# Patient Record
Sex: Female | Born: 1980 | Race: Black or African American | Hispanic: No | Marital: Single | Smoking: Current every day smoker
Health system: Southern US, Community
[De-identification: ages and names within clinical notes are randomized; demographics above are authoritative.]

## PROBLEM LIST (undated history)

## (undated) DIAGNOSIS — F419 Anxiety disorder, unspecified: Secondary | ICD-10-CM

## (undated) DIAGNOSIS — D649 Anemia, unspecified: Secondary | ICD-10-CM

## (undated) DIAGNOSIS — R7881 Bacteremia: Secondary | ICD-10-CM

## (undated) DIAGNOSIS — R011 Cardiac murmur, unspecified: Secondary | ICD-10-CM

## (undated) DIAGNOSIS — Z8679 Personal history of other diseases of the circulatory system: Secondary | ICD-10-CM

## (undated) DIAGNOSIS — Z9889 Other specified postprocedural states: Secondary | ICD-10-CM

## (undated) DIAGNOSIS — R569 Unspecified convulsions: Secondary | ICD-10-CM

## (undated) DIAGNOSIS — F1910 Other psychoactive substance abuse, uncomplicated: Secondary | ICD-10-CM

## (undated) DIAGNOSIS — N186 End stage renal disease: Secondary | ICD-10-CM

## (undated) DIAGNOSIS — B955 Unspecified streptococcus as the cause of diseases classified elsewhere: Secondary | ICD-10-CM

## (undated) DIAGNOSIS — I5022 Chronic systolic (congestive) heart failure: Secondary | ICD-10-CM

## (undated) DIAGNOSIS — J189 Pneumonia, unspecified organism: Secondary | ICD-10-CM

## (undated) DIAGNOSIS — M1990 Unspecified osteoarthritis, unspecified site: Secondary | ICD-10-CM

## (undated) DIAGNOSIS — R0600 Dyspnea, unspecified: Secondary | ICD-10-CM

## (undated) DIAGNOSIS — M329 Systemic lupus erythematosus, unspecified: Secondary | ICD-10-CM

## (undated) DIAGNOSIS — M3214 Glomerular disease in systemic lupus erythematosus: Secondary | ICD-10-CM

## (undated) DIAGNOSIS — Z8709 Personal history of other diseases of the respiratory system: Secondary | ICD-10-CM

## (undated) HISTORY — PX: AV FISTULA PLACEMENT: SHX1204

## (undated) HISTORY — DX: Chronic systolic (congestive) heart failure: I50.22

---
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
## 2003-09-08 ENCOUNTER — Emergency Department (HOSPITAL_COMMUNITY): Admission: EM | Admit: 2003-09-08 | Discharge: 2003-09-08 | Payer: Self-pay | Admitting: Emergency Medicine

## 2004-08-26 ENCOUNTER — Emergency Department (HOSPITAL_COMMUNITY): Admission: EM | Admit: 2004-08-26 | Discharge: 2004-08-26 | Payer: Self-pay | Admitting: Emergency Medicine

## 2004-12-28 DIAGNOSIS — M3214 Glomerular disease in systemic lupus erythematosus: Secondary | ICD-10-CM

## 2004-12-28 HISTORY — DX: Glomerular disease in systemic lupus erythematosus: M32.14

## 2004-12-29 DIAGNOSIS — I313 Pericardial effusion (noninflammatory): Secondary | ICD-10-CM | POA: Insufficient documentation

## 2004-12-29 DIAGNOSIS — I314 Cardiac tamponade: Secondary | ICD-10-CM | POA: Insufficient documentation

## 2005-02-20 ENCOUNTER — Emergency Department (HOSPITAL_COMMUNITY): Admission: EM | Admit: 2005-02-20 | Discharge: 2005-02-20 | Payer: Self-pay | Admitting: *Deleted

## 2005-12-01 ENCOUNTER — Emergency Department (HOSPITAL_COMMUNITY): Admission: EM | Admit: 2005-12-01 | Discharge: 2005-12-01 | Payer: Self-pay | Admitting: Emergency Medicine

## 2005-12-03 ENCOUNTER — Ambulatory Visit: Payer: Self-pay | Admitting: Cardiovascular Disease

## 2005-12-04 ENCOUNTER — Encounter: Payer: Self-pay | Admitting: Cardiology

## 2005-12-04 ENCOUNTER — Encounter (INDEPENDENT_AMBULATORY_CARE_PROVIDER_SITE_OTHER): Payer: Self-pay | Admitting: Specialist

## 2005-12-04 ENCOUNTER — Inpatient Hospital Stay (HOSPITAL_COMMUNITY): Admission: EM | Admit: 2005-12-04 | Discharge: 2005-12-12 | Payer: Self-pay | Admitting: Emergency Medicine

## 2005-12-04 ENCOUNTER — Ambulatory Visit: Payer: Self-pay | Admitting: Cardiology

## 2005-12-04 ENCOUNTER — Ambulatory Visit: Payer: Self-pay | Admitting: Infectious Diseases

## 2005-12-05 ENCOUNTER — Ambulatory Visit: Payer: Self-pay | Admitting: Internal Medicine

## 2005-12-05 ENCOUNTER — Encounter: Payer: Self-pay | Admitting: Internal Medicine

## 2005-12-07 ENCOUNTER — Ambulatory Visit: Payer: Self-pay | Admitting: Cardiology

## 2005-12-11 ENCOUNTER — Encounter (INDEPENDENT_AMBULATORY_CARE_PROVIDER_SITE_OTHER): Payer: Self-pay | Admitting: *Deleted

## 2006-02-08 ENCOUNTER — Ambulatory Visit: Payer: Self-pay | Admitting: Cardiovascular Disease

## 2006-02-18 ENCOUNTER — Encounter: Payer: Self-pay | Admitting: Cardiology

## 2006-02-18 ENCOUNTER — Ambulatory Visit: Payer: Self-pay

## 2006-03-28 ENCOUNTER — Inpatient Hospital Stay (HOSPITAL_COMMUNITY): Admission: EM | Admit: 2006-03-28 | Discharge: 2006-03-29 | Payer: Self-pay | Admitting: Emergency Medicine

## 2006-03-28 ENCOUNTER — Ambulatory Visit: Payer: Self-pay | Admitting: Internal Medicine

## 2006-03-28 ENCOUNTER — Ambulatory Visit: Payer: Self-pay | Admitting: *Deleted

## 2006-03-29 ENCOUNTER — Encounter: Payer: Self-pay | Admitting: Cardiology

## 2006-08-26 ENCOUNTER — Ambulatory Visit: Payer: Self-pay | Admitting: Cardiovascular Disease

## 2007-01-24 ENCOUNTER — Encounter: Admission: RE | Admit: 2007-01-24 | Discharge: 2007-01-24 | Payer: Self-pay | Admitting: Nephrology

## 2008-09-05 ENCOUNTER — Inpatient Hospital Stay (HOSPITAL_COMMUNITY): Admission: AD | Admit: 2008-09-05 | Discharge: 2008-09-05 | Payer: Self-pay | Admitting: Obstetrics & Gynecology

## 2008-10-04 ENCOUNTER — Inpatient Hospital Stay (HOSPITAL_COMMUNITY): Admission: AD | Admit: 2008-10-04 | Discharge: 2008-10-04 | Payer: Self-pay | Admitting: Obstetrics

## 2008-10-08 ENCOUNTER — Inpatient Hospital Stay (HOSPITAL_COMMUNITY): Admission: AD | Admit: 2008-10-08 | Discharge: 2008-10-11 | Payer: Self-pay | Admitting: Obstetrics

## 2008-10-31 ENCOUNTER — Emergency Department (HOSPITAL_COMMUNITY): Admission: EM | Admit: 2008-10-31 | Discharge: 2008-10-31 | Payer: Self-pay | Admitting: Emergency Medicine

## 2009-06-06 ENCOUNTER — Emergency Department (HOSPITAL_COMMUNITY): Admission: EM | Admit: 2009-06-06 | Discharge: 2009-06-06 | Payer: Self-pay | Admitting: Emergency Medicine

## 2009-07-30 ENCOUNTER — Inpatient Hospital Stay (HOSPITAL_COMMUNITY): Admission: EM | Admit: 2009-07-30 | Discharge: 2009-08-01 | Payer: Self-pay | Admitting: Emergency Medicine

## 2009-07-30 ENCOUNTER — Ambulatory Visit: Payer: Self-pay | Admitting: Vascular Surgery

## 2009-07-30 ENCOUNTER — Encounter (INDEPENDENT_AMBULATORY_CARE_PROVIDER_SITE_OTHER): Payer: Self-pay | Admitting: Emergency Medicine

## 2011-03-09 ENCOUNTER — Emergency Department (HOSPITAL_COMMUNITY): Payer: Self-pay

## 2011-03-09 ENCOUNTER — Emergency Department (HOSPITAL_COMMUNITY)
Admission: EM | Admit: 2011-03-09 | Discharge: 2011-03-10 | Disposition: A | Discharge status: Home / Self Care | Payer: Self-pay | Attending: Emergency Medicine | Admitting: Emergency Medicine

## 2011-03-09 ENCOUNTER — Encounter (HOSPITAL_COMMUNITY): Payer: Self-pay | Admitting: Radiology

## 2011-03-09 DIAGNOSIS — R059 Cough, unspecified: Secondary | ICD-10-CM | POA: Insufficient documentation

## 2011-03-09 DIAGNOSIS — J984 Other disorders of lung: Secondary | ICD-10-CM | POA: Insufficient documentation

## 2011-03-09 DIAGNOSIS — R230 Cyanosis: Secondary | ICD-10-CM | POA: Insufficient documentation

## 2011-03-09 DIAGNOSIS — J189 Pneumonia, unspecified organism: Secondary | ICD-10-CM | POA: Insufficient documentation

## 2011-03-09 DIAGNOSIS — R05 Cough: Secondary | ICD-10-CM | POA: Insufficient documentation

## 2011-03-09 DIAGNOSIS — J40 Bronchitis, not specified as acute or chronic: Secondary | ICD-10-CM | POA: Insufficient documentation

## 2011-03-09 LAB — CBC
HCT: 34.6 % — ABNORMAL LOW (ref 36.0–46.0)
Hemoglobin: 11.3 g/dL — ABNORMAL LOW (ref 12.0–15.0)
MCHC: 32.7 g/dL (ref 30.0–36.0)
MCV: 82 fL (ref 78.0–100.0)
RDW: 13.4 % (ref 11.5–15.5)

## 2011-03-09 LAB — DIFFERENTIAL
Basophils Absolute: 0 10*3/uL (ref 0.0–0.1)
Basophils Relative: 0 % (ref 0–1)
Eosinophils Absolute: 0.2 10*3/uL (ref 0.0–0.7)
Eosinophils Relative: 2 % (ref 0–5)
Lymphocytes Relative: 14 % (ref 12–46)
Lymphs Abs: 1.1 10*3/uL (ref 0.7–4.0)
Monocytes Absolute: 0.7 10*3/uL (ref 0.1–1.0)
Monocytes Relative: 9 % (ref 3–12)
Neutro Abs: 5.9 10*3/uL (ref 1.7–7.7)
Neutrophils Relative %: 75 % (ref 43–77)

## 2011-03-09 LAB — POCT CARDIAC MARKERS
CKMB, poc: <1 ng/mL — ABNORMAL LOW (ref 1.0–8.0)
Myoglobin, poc: 140 ng/mL (ref 12–200)
Troponin i, poc: <0.05 ng/mL (ref 0.00–0.09)

## 2011-03-09 LAB — POCT I-STAT, CHEM 8
BUN: 21 mg/dL (ref 6–23)
Calcium, Ion: 1.1 mmol/L — ABNORMAL LOW (ref 1.12–1.32)
Chloride: 108 mEq/L (ref 96–112)
Creatinine, Ser: 1.6 mg/dL — ABNORMAL HIGH (ref 0.4–1.2)
Glucose, Bld: 87 mg/dL (ref 70–99)
HCT: 37 % (ref 36.0–46.0)
Hemoglobin: 12.6 g/dL (ref 12.0–15.0)
Potassium: 3.7 mEq/L (ref 3.5–5.1)
Sodium: 140 mEq/L (ref 135–145)
TCO2: 24 mmol/L (ref 0–100)

## 2011-03-09 MED ORDER — IOHEXOL 300 MG/ML  SOLN
80.0000 mL | Freq: Once | INTRAMUSCULAR | Status: AC | PRN
  Administered 2011-03-09: 80 mL via INTRAVENOUS

## 2011-04-04 LAB — COMPREHENSIVE METABOLIC PANEL
ALT: <8 U/L (ref 0–35)
AST: 15 U/L (ref 0–37)
Alkaline Phosphatase: 33 U/L — ABNORMAL LOW (ref 39–117)
BUN: 35 mg/dL — ABNORMAL HIGH (ref 6–23)
CO2: 22 mEq/L (ref 19–32)
Calcium: 8.3 mg/dL — ABNORMAL LOW (ref 8.4–10.5)
Creatinine, Ser: 1.63 mg/dL — ABNORMAL HIGH (ref 0.4–1.2)
GFR calc Af Amer: >60 mL/min (ref >60)
GFR calc non Af Amer: 38 mL/min — ABNORMAL LOW (ref >60)
Glucose, Bld: 113 mg/dL — ABNORMAL HIGH (ref 70–99)
Glucose, Bld: 87 mg/dL (ref 70–99)
Potassium: 4.9 mEq/L (ref 3.5–5.1)
Sodium: 134 mEq/L — ABNORMAL LOW (ref 135–145)
Total Protein: 8.1 g/dL (ref 6.0–8.3)

## 2011-04-04 LAB — URINALYSIS, ROUTINE W REFLEX MICROSCOPIC
Glucose, UA: NEGATIVE mg/dL
Leukocytes, UA: NEGATIVE
Protein, ur: >300 mg/dL — AB
Specific Gravity, Urine: 1.024 (ref 1.005–1.030)
Urobilinogen, UA: 0.2 mg/dL (ref 0.0–1.0)

## 2011-04-04 LAB — IRON AND TIBC
Saturation Ratios: 6 % — ABNORMAL LOW (ref 20–55)
TIBC: 169 ug/dL — ABNORMAL LOW (ref 250–470)
UIBC: 159 ug/dL

## 2011-04-04 LAB — CBC
Hemoglobin: 10 g/dL — ABNORMAL LOW (ref 12.0–15.0)
Hemoglobin: 8.8 g/dL — ABNORMAL LOW (ref 12.0–15.0)
MCHC: 33.8 g/dL (ref 30.0–36.0)
MCV: 79.9 fL (ref 78.0–100.0)
RBC: 3.23 MIL/uL — ABNORMAL LOW (ref 3.87–5.11)
RBC: 3.69 MIL/uL — ABNORMAL LOW (ref 3.87–5.11)
RDW: 14.2 % (ref 11.5–15.5)
RDW: 14.3 % (ref 11.5–15.5)

## 2011-04-04 LAB — DIFFERENTIAL
Basophils Absolute: 0 10*3/uL (ref 0.0–0.1)
Basophils Relative: 0 % (ref 0–1)
Eosinophils Relative: 0 % (ref 0–5)
Lymphocytes Relative: 11 % — ABNORMAL LOW (ref 12–46)
Neutro Abs: 5.7 10*3/uL (ref 1.7–7.7)

## 2011-04-04 LAB — RETICULOCYTES
RBC.: 3.32 MIL/uL — ABNORMAL LOW (ref 3.87–5.11)
Retic Ct Pct: 0.7 % (ref 0.4–3.1)

## 2011-04-04 LAB — BASIC METABOLIC PANEL
BUN: 28 mg/dL — ABNORMAL HIGH (ref 6–23)
CO2: 20 mEq/L (ref 19–32)
Calcium: 8.2 mg/dL — ABNORMAL LOW (ref 8.4–10.5)
Creatinine, Ser: 1.1 mg/dL (ref 0.4–1.2)
GFR calc non Af Amer: >60 mL/min (ref >60)
Glucose, Bld: 88 mg/dL (ref 70–99)
Sodium: 134 mEq/L — ABNORMAL LOW (ref 135–145)

## 2011-04-04 LAB — URINE MICROSCOPIC-ADD ON

## 2011-04-04 LAB — SEDIMENTATION RATE: Sed Rate: 92 mm/hr — ABNORMAL HIGH (ref 0–22)

## 2011-04-04 LAB — C-REACTIVE PROTEIN: CRP: 6.1 mg/dL — ABNORMAL HIGH (ref <0.6)

## 2011-04-04 LAB — FERRITIN: Ferritin: 41 ng/mL (ref 10–291)

## 2011-04-06 LAB — URINALYSIS, ROUTINE W REFLEX MICROSCOPIC
Bilirubin Urine: NEGATIVE
Ketones, ur: 15 mg/dL — AB
Nitrite: NEGATIVE
Specific Gravity, Urine: 1.021 (ref 1.005–1.030)
Urobilinogen, UA: 0.2 mg/dL (ref 0.0–1.0)

## 2011-04-06 LAB — URINE MICROSCOPIC-ADD ON

## 2011-04-06 LAB — POCT I-STAT, CHEM 8
Calcium, Ion: 1.1 mmol/L — ABNORMAL LOW (ref 1.12–1.32)
Glucose, Bld: 71 mg/dL (ref 70–99)
HCT: 34 % — ABNORMAL LOW (ref 36.0–46.0)
TCO2: 16 mmol/L (ref 0–100)

## 2011-04-06 LAB — POCT PREGNANCY, URINE: Preg Test, Ur: NEGATIVE

## 2011-05-12 NOTE — H&P (Signed)
[name], [name] NO.:  [id number]   MEDICAL RECORD NO.:  [id number]          PATIENT TYPE:  INP   LOCATION:  5507                         FACILITY:  [facility]   PHYSICIAN:  Orpah Melter, MD       DATE OF BIRTH:  1981/11/05   DATE OF ADMISSION:  07/30/2009  DATE OF DISCHARGE:                              HISTORY & PHYSICAL   PRIMARY CARE PHYSICIAN:  Unassigned.   HISTORY OF PRESENT ILLNESS:  The patient is a 30 year old female with  complaints of stiff from head to toe.  The patient also reports on the  previous evening nausea with one episode of vomiting.  This is now  resolved but her stiffness persists.  The patient says that she had leg  pain, predominantly in her calves, but denies current fevers, chills,  night sweats, chest pain, shortness of breath, PND, or orthopnea.  The  patient has a history of lupus including a chronic lupus nephritis, and  she has been admitted for further evaluation and treatment.   PAST MEDICAL HISTORY:  1. History of pericarditis.  2. Lupus erythematosus.  3. History of lupus nephritis.   SOCIAL HISTORY:  The patient reports smoking 5-6 cigarettes daily.  Also  admits to occasional marijuana, but denies alcohol use.   FAMILY HISTORY:  Not available.   REVIEW OF SYSTEMS:  All other comprehensive review systems are negative.   MEDICATIONS:  The patient is a allergic to TOBRAMYCIN.   CURRENT MEDICATIONS:  None.   PHYSICAL EXAMINATION:  GENERAL:  She is a well-developed, well-nourished  black female currently in no apparent distress.  VITAL SIGNS:  Blood pressure 109/77, heart rate 89, respiratory rate 20,  temperature 98.7.  HEENT:  No jugular venous distention or lymphadenopathy.  Oropharynx is  clear.  Mucous membranes pink and moist.  TMs clear bilaterally.  Pupils  equal, reactive to light and accommodation.  Extraocular muscles are  intact.  CARDIOVASCULAR:  Regular rate and rhythm without murmurs, rubs, or  gallops.  PULMONARY:  Lungs are clear to auscultation bilaterally.  ABDOMEN:  Soft, nontender, nondistended without hepatosplenomegaly.  Bowel sounds are present.  She has no rebound or guarding.  EXTREMITIES:  She has synovitis in her hands bilaterally along with  swelling predominantly in her hands.  She does not have evidence of  synovitis or swelling in her feet, otherwise she does not have clubbing  or cyanosis.  NEURO:  Cranial nerves II through XII are grossly intact.  She has no  focal neurological deficits.   STUDIES:  Bilateral ultrasound showing no evidence of clots.   LABORATORY DATA:  White count 7.0, H&H 10 and 30, MCV 80, platelets 170.  Sodium 135, potassium 4.8, chloride 109, bicarb 22, BUN 35, creatinine  1.63, baseline creatinine 0.51, glucose 87, ESR 92.  UA shows specific  gravity of 1.024 with protein greater than 300.   ASSESSMENT AND PLAN:  1. Lupus flare.  We will treat initially with Solu-Medrol, transition      to prednisone.  We will consult Nephrology if her creatinine does  not decrease back to normal with IV fluids.  2. Acute renal failure on chronic renal insufficiency.  We will start      IV fluids and reassess in a.m.  3. Anemia.  We will check iron studies to assess for potential iron      deficiency, although this is likely secondary to chronic      inflammation.  4. Disposition.  The patient is full code.  H&P was constructed by      reviewing past medical history conferring with emergency medical      room physician, reviewing the emergency medical record.   TIME SPENT:  1 hour.      Orpah Melter, MD  Electronically Signed     Orpah Melter, MD  Electronically Signed    JF/MEDQ  D:  07/30/2009  T:  07/31/2009  Job:  [id number]

## 2011-05-12 NOTE — Discharge Summary (Signed)
[name], [name]                ACCOUNT NO.:  [id number]   MEDICAL RECORD NO.:  [id number]          PATIENT TYPE:  INP   LOCATION:  [id number]                         FACILITY:  [facility]   PHYSICIAN:  Domingo Mend, M.D. DATE OF BIRTH:  [birth year]   DATE OF ADMISSION:  07/30/2009  DATE OF DISCHARGE:  08/01/2009                               DISCHARGE SUMMARY   DISCHARGE DIAGNOSES:  1. Generalized joint pain secondary to lupus flare, improving.  2. Acute renal insufficiency.  3. Systemic lupus erythematosus.  4. History of lupus pericarditis.  5. Protein-calorie malnutrition.   DISCHARGE MEDICATIONS:  1. Multivitamin 1 tablet daily.  2. Prednisone 60 mg today and tomorrow and then decrease by 10 mg      every other day until off.   DISPOSITION AND FOLLOWUP:  [name] will be discharged home today in  stable condition.  She currently does not have a primary care physician.  I have asked Case Management to assist in finding a new one for her.  She also has a rheumatologist, Dr. Charlestine Night that I have instructed to  follow up with him within the next couple of weeks.   CONSULTATION THIS HOSPITALIZATION:  None.   IMAGES AND PROCEDURES:  None.   HISTORY AND PHYSICAL EXAMINATION:  For full details, please refer to the  dictation on July 30, 2009 by Dr. Leeroy Bock, but in brief, [name] is a  very pleasant 30 year old African American young lady with a history of  systemic lupus and has a history of lupus pericarditis who presented  with stiffness from head to toe.  In the emergency department, she was  also found to have acute renal insufficiency with a creatinine of 1.63  and hence she was admitted to our service for further evaluation and  management.   HOSPITAL COURSE:  1. Generalized pain, likely secondary to a lupus flare.  She was      started on high-dose steroids.  Her pain has almost completely      resolved.  I will send her home on a tapering dose of prednisone.      I have  asked her to follow up with Dr. Charlestine Night at her convenience.  2. Acute renal insufficiency, likely secondary to decreased      intravascular volume as it has completely corrected to a normal of      1.10 simply with IV fluids.  3. Protein-calorie malnutrition.  She has been seen by the      nutritionist in the hospital.  She has been started on a      multivitamin as well as Resource.   DISCHARGE VITAL SIGNS:  Blood pressure 145/98, heart rate 79,  respirations 18, and O2 sats 100% room air with a temperature of 97.   DISCHARGE LABORATORY DATA:  Sodium 134, potassium 4.5, chloride 108,  bicarb 20, BUN 28, creatinine 1.10 with a glucose of 88.      Domingo Mend, M.D.  Electronically Signed     EH/MEDQ  D:  08/01/2009  T:  08/02/2009  Job:  [id number]

## 2011-05-15 NOTE — Discharge Summary (Signed)
[name], [name] NO.:  [id number]   MEDICAL RECORD NO.:  [id number]          PATIENT TYPE:  INP   LOCATION:  2025                         FACILITY:  [facility]   PHYSICIAN:  Jenkins Rouge, M.D.     DATE OF BIRTH:  1981-06-12   DATE OF ADMISSION:  03/28/2006  DATE OF DISCHARGE:  03/29/2006                                 DISCHARGE SUMMARY   PHYSICIANS:  Discharging physician: Jenkins Rouge, M.D.  Primary cardiologist: Jenkins Rouge, M.D.  Primary care physician: Dr. Arnoldo Morale, [place].  Rheumatologist: Lindaann Slough, M.D.   DISCHARGE DIAGNOSIS:  Pericarditis.   PAST MEDICAL HISTORY:  1.  Pericarditis with pericardial effusion and tamponade status post      pericardiocentesis in December 2006.  2.  Systemic lupus.  3.  Lupus nephritis/nephrotic-range proteinuria/lupus nephritis.  4.  Anemia.  5.  Polysubstance abuse including tobacco, marijuana, alcohol.  6.  Pruritus with pleural effusion.  7.  Mild cardiomyopathy.  Last echocardiogram February 18, 2006, showed an      ejection fraction of 65% with normal wall motion.  Discharging      echocardiogram pending.   HOSPITAL COURSE:  [name] is a 30 year old African-American female with  history of lupus, previous pericardial effusion with pericardiocentesis,  treated in December 2006. The patient had done well since that  hospitalization until several days before this admission.  The patient  states she had chest discomfort for 3 days associated with nausea, vomiting,  fever, chills, and night sweats.  The patient presented to [facility]  Emergency Room for evaluation.  Seen by Dr. Wilhemina Cash.  Bedside echocardiogram  quick look showed normal LV function without any tamponade.  The patient  was admitted with recurrent pericarditis, started on colchicine.  We  continued her prednisone at a lower dose.  Asked rheumatology to consult.  The patient was seen by rheumatology on March 28, 2006, and ordered lab  work,  results of which are pending including DNA antibody, C3 and C4 levels.   Dr. Johnsie Cancel in to see patient on day of discharge.  The patient afebrile,  improvements in chest discomfort.  Patient being discharged home to follow  up as stated.  1.  Dr. Arnoldo Morale in one week.  Patient agrees to call for appointment.  2.  Dr. Charlestine Night in one week.  Patient agrees to call for appointment.  3.  If patient's chest pain or shortness of breath persists, she is to      return to the emergency room or seek medical assistance.   DIET:  Low-sodium.   ACTIVITY:  Increase activity slowly.   WOUND CARE:  Not applicable.   DISCHARGE MEDICATIONS:  1.  Motrin 800 mg p.o. 3 times a day with food.  2.  Prednisone 10 mg daily.  3.  Colchicine 0.6 mg p.o. twice daily.  4.  CellCept as previously prescribed.  5.  Nexium as previously prescribed.   The patient is advised to refrain from alcohol, tobacco, and marijuana  products.   At time of discharge, cardiac enzymes show troponin 0.01.  Potassium  3.9,  BUN 9, creatinine 0.7.  Hemoglobin 13.1, hematocrit 37.6, white blood cell  count 8.9.   The patient has been given prescriptions for Motrin, prednisone, and  colchicine.  I have also given her 10 tablets of Percocet to have available  as needed with no refills.   Duration of discharge encounter: 30 minutes.      Rosanne Sack, NP    ______________________________  Jenkins Rouge, M.D.    MB/MEDQ  D:  03/29/2006  T:  03/30/2006  Job:  [id number]   cc:   Dr. Arnoldo Morale, [place]   Lindaann Slough, M.D.  [address]  [place]  [place] [address]

## 2011-05-15 NOTE — H&P (Signed)
[name], [name]                ACCOUNT NO.:  [id number]   MEDICAL RECORD NO.:  [id number]          PATIENT TYPE:  INP   LOCATION:  1846                         FACILITY:  [facility]   PHYSICIAN:  Corinna L. Conley Canal, MDDATE OF BIRTH:  May 18, 1981   DATE OF ADMISSION:  12/04/2005  DATE OF DISCHARGE:                                HISTORY & PHYSICAL   CHIEF COMPLAINT:  Shortness of breath and vomiting.   HISTORY OF PRESENT ILLNESS:  [name] is an unassigned 30 year old black  female who presents to the emergency room with shortness of breath,  orthopnea.  She has been vomiting and unable to even keep down liquids for  several days.  She has also been having diarrhea.  She denies fevers,  chills, sick contacts.  She has no previous past medical history.  Patient  has had a cough productive of yellow sputum and pleuritic left-sided chest  pain.   PAST MEDICAL HISTORY:  None except she was recently diagnosed, I believe,  with a urinary tract infection, although her UA in the emergency room showed  no nitrites or leukocyte esterase and Phenergan p.r.n.   She is allergic to [facility].   SOCIAL HISTORY:  She smokes about five cigarettes a day.  She smokes  marijuana occasionally.  She drinks rarely.  She has an 34-year-old child and  does not work.   FAMILY HISTORY:  Her aunt has lupus.   REVIEW OF SYSTEMS:  As above, otherwise negative.   PHYSICAL EXAMINATION:  VITAL SIGNS:  Temperature 100.6 orally, blood  pressure 133/83, pulse 136, respiratory rate 22, oxygen saturation 93% on  room air.  GENERAL:  Patient is a thin black female in no acute distress.  HEENT:  Pupils are equal, round, and reactive to light.  She is wearing a  wig.  Moist mucous membranes.  Oropharynx is without erythema or exudate.  NECK:  Supple.  No lymphadenopathy.  No thyromegaly.  LUNGS:  Diminished at the bases.  CARDIOVASCULAR:  Rapid, regular.  No murmurs, rubs, or gallops.  ABDOMEN:  Soft, nontender,  nondistended.  GENITOURINARY:  Deferred.  RECTAL:  Deferred.  GENITOURINARY:  No clubbing, cyanosis, edema.  SKIN:  No rash.  PSYCHIATRIC:  Calm and cooperative.  Normal affect.  NEUROLOGIC:  Alert and oriented.  Cranial nerves and sensory motor  examination are grossly intact.   LABORATORIES:  White count 17,000 with 65% neutrophils, 11% lymphocytes, 12%  monocytes, 10% eosinophils.  INR is 1.4, PTT 33.  D-dimer 8.51.  Complete  metabolic panel significant for an albumin of 1.9, otherwise unremarkable.  Urine pregnancy on December 5 was negative.  UA on December 5 showed greater  than 80 ketones, specific gravity greater than 1.046, moderate bilirubin,  large blood, greater than 300 protein, negative nitrites, negative leukocyte  esterase, hyalin casts, granular casts, 3-6 white cells, 7-10 red cells, few  bacteria.  Chest x-ray shows development of an enlarged cardiac silhouette  compatible with moderate to large pericardial effusion, new small to large  bilateral pleural effusions, and bilateral lower lung  atelectasis/consolidation.  CT of the chest  shows no pulmonary embolus.  It  shows a large pericardial effusion and small to moderate bilateral pleural  effusions with left lower lobe and right middle lobe atelectasis versus  consolidation, moderately enlarged bilateral axillary pericardial,  mediastinal, and supraclavicular lymph nodes, question inflammatory or  neoplastic process.   ASSESSMENT/PLAN:  1.  Large pericardial effusion with tachycardia, dyspnea.  Patient is will      get a thoracentesis by Dr. Johnsie Cancel this morning.  TSH and a sedimentation      rate have been ordered.  2.  Pneumonia.  She has received a dose of azithromycin and I will also give      Rocephin.  3.  Bilateral pleural effusions.  These may need to be tapped eventually.  4.  Proteinuria with hypoalbuminemia.  Patient denies swelling but I will      get 24-hour urine for protein to rule out nephrotic  syndrome.  5.  Diffuse lymphadenopathy.  6.  Anemia, microcytic.  I will check a ferritin and Hemoccult stools.  7.  Nausea, vomiting, diarrhea.  I will check stool studies, given      Phenergan, intravenous fluids, and Protonix.  8.  Eosinophilia.      Corinna L. Conley Canal, MD  Electronically Signed     CLS/MEDQ  D:  12/04/2005  T:  12/04/2005  Job:  [id number]

## 2011-05-15 NOTE — H&P (Signed)
NAMEDUSTY, LEE NO.:  [id number]   MEDICAL RECORD NO.:  [id number]          PATIENT TYPE:  INP   LOCATION:  2025                         FACILITY:  [facility]   PHYSICIAN:  Scarlett Presto, M.D.   DATE OF BIRTH:  November 09, 1981   DATE OF ADMISSION:  03/28/2006  DATE OF DISCHARGE:                                HISTORY & PHYSICAL   CARDIOLOGIST:  Jenkins Rouge, M.D.   ADMITTING CARDIOLOGIST:  Scarlett Presto, M.D.   PRIMARY CARE PHYSICIAN:  Dr. Arnoldo Morale in [facility].   CHIEF COMPLAINT:  Chest pain.   HISTORY OF PRESENT ILLNESS:  This is a 30 year old African-American female  with a history of lupus, previous pericardial effusion with pericardial  centesis in December of 2006.  The patient states that for the last 3 days  she has had chest pain; however, last night her chest pain worsened, and she  also got some nausea and vomiting associated with this chest pain.  The  patient also states that she has had some fevers, chills and sweats during  the night.  The patient was admitted through the emergency room today.  The  patient states that she had just previously decreased her prednisone to 10  mg p.o. daily approximately 10 days ago, and began to have her symptoms of  shortness of breath about 4 days ago.   PAST MEDICAL HISTORY:  1.  Significant for lupus.  2.  She was admitted on December 04, 2005 with history of pericarditis of      pleura and kidneys.  3.  Pericardial effusion, status post pericardial centesis in December.      Recent echo in February of 2005 showed no residual effusion, essentially      normal limit echo.  4.  Nephritis followed by Donato Heinz, M.D.  Renal function is okay.   ALLERGIES:  TOBREX - she gets some swelling in her face.   MEDICATIONS:  1.  CellCept 250 mg b.i.d.  2.  Darvocet-N 100 t.i.d.  3.  Omeprazole 20 mg daily.  4.  Prednisone 10 mg daily.   SOCIAL HISTORY:  She lives in [place].  She is single.  She lives  with  her boyfriend, and at this time is currently unemployed.  She has 1  daughter, age 20.  She smokes approximately 3 cigarettes a day and has an  occasional alcoholic drink.  She states that she did use some illicit drugs  last year; however, she has not used any in over a year.  She did use some  marijuana last week.  She eats a regular diet and has no exercise program.   FAMILY HISTORY:  Mother is alive at age 40.  Father is alive at age 102.  She  has 2 sisters who are healthy.   REVIEW OF SYSTEMS:  CONSTITUTIONAL:  She has had fevers, chills and sweats.  No weight changes or adenopathy.  HEENT:  She has had some occasional  headaches.  No nasal drainage, no vision changes, no dental problems.  SKIN:  Negative for rashes and lesions.  CARDIOPULMONARY:  She has some chest pain,  shortness of breath, dyspnea on exertion, ankle edema.  Some presyncopal  episodes.  No coughing or wheezing.  No claudication.  GU:  She states that  she has had some dark urine.  No frequency or dysuria.  NEUROPSYCHIATRIC:  Positive for weakness, depression and anxiety.  MUSCULOSKELETAL:  She has  some arthralgia in her left arm.  GI:  She has some positive nausea and  vomiting.  No melena.  No hematemesis.  No abdominal pain or change in bowel  habits.  All other systems negative except as noted.   PHYSICAL EXAMINATION:  VITAL SIGNS:  Temperature of 99.2, pulse 112,  respiratory rate 20, blood pressure 130/88, saturating 100% on room air.  GENERAL:  She is in no acute distress.  HEENT:  Pupils are equal and reactive.  Normocephalic, atraumatic.  Her  dentition is her own.  NECK:  No JVD.  Supple without lymphadenopathy.  CARDIOVASCULAR:  Regular rate and rhythm.  No rub.  LUNGS:  Clear to auscultation bilaterally without wheezes, rales or rhonchi.  SKIN:  She has striae, multiple, on her legs and abdomen and breasts.  ABDOMEN:  Soft, nontender, nondistended.  No pulsus paradoxus.  EXTREMITIES:  There  were +1 pulses.  No clubbing, cyanosis, or edema.  MUSCULOSKELETAL:  No joint deformities or effusions.  No spine or CVA  tenderness.  NEUROLOGIC:  Alert and oriented x3.  Cranial nerves II-XII grossly intact.  Strength 5/5 in all extremities.  __________ normal limit sensation  throughout.   CHEST X-RAY:  Chest x-ray shows mild cardiomegaly, which is stable.   ELECTROCARDIOGRAM:  EKG shows a sinus tachycardia with a rate of 107, axis  within normal limits.  PR is 125.  QRS is 79.  QTC is 423.  She has diffuse  ST changes throughout with PR decreased.   LABORATORY DATA:  White blood cell count of 8.9, hemoglobin of 13.1,  hematocrit of 37.6, platelet count of 216.  Sodium of 138, potassium 3.7,  chloride 105, BUN of 11, creatinine of 0.7, glucose of 77.  CK-MB is less  than 0.1.  Troponin is less than 0.05 x2.  Pregnancy test is negative.   We did a quick look echocardiogram with normal limit left ventricular  ejection fraction, normal limit right ventricular systolic function, no  right ventricular or RA collapse.  Small circumferential effusion.  No  tamponade.   ASSESSMENT:  Recurrent pericarditis with lupus.   PLAN:  1.  Start colchicine.  2.  Continue pregnancy.  3.  2-D echocardiogram in the a.m.  4.  Rheumatology consult in the a.m.     ______________________________  April Humphrey, NP      Scarlett Presto, M.D.  Electronically Signed    AH/MEDQ  D:  03/28/2006  T:  03/29/2006  Job:  [id number]

## 2011-05-15 NOTE — Discharge Summary (Signed)
[name], [name]                ACCOUNT NO.:  [id number]   MEDICAL RECORD NO.:  [id number]          PATIENT TYPE:  INP   LOCATION:  4706                         FACILITY:  [facility]   PHYSICIAN:  Corinna L. Conley Canal, MDDATE OF BIRTH:  11-21-1981   DATE OF ADMISSION:  12/04/2005  DATE OF DISCHARGE:  12/12/2005                                 DISCHARGE SUMMARY   DISCHARGE MEDICATIONS:  1.  Prednisone 40 milligrams a day.  2.  Calcium supplementation at least 1000 milligrams a day.  3.  Fish oil capsules.  4.  Aspirin or Motrin as needed for pain.   DISCHARGE DIAGNOSES:  1.  Pericarditis with pericardial effusion and tamponade.  2.  Status post pericardiocentesis.  3.  Pruritus with pleural effusions.  4.  Newly diagnosed lupus.  5.  Nephrotic range proteinuria/lupus nephritis.  6.  Anemia.  7.  Tobacco abuse, marijuana use, counseled against.  8.  Hypoalbuminemia  9.  Resolved nausea, vomiting, diarrhea.   CONDITION:  Stable.   ACTIVITY:  No heavy lifting for two weeks.   DIET:  Should be low-salt.   FOLLOW UP:  Dr. Marval Regal in a week at which time renal biopsy can be  followed up. Follow up with Dr. Jenkins Rouge in two months to evaluate for  recurrent effusion and/or residual constriction. Follow up with  rheumatologist in two weeks. Follow up with primary care physician in 2-4  weeks.   CONSULTATIONS:  1.  Dr. Jenkins Rouge.  2.  Dr. Marveen Reeks.  3.  Dr. Lita Mains.  4.  Dr. Marval Regal.   PROCEDURES:  1.  Pericardiocentesis on December 04, 2005.  2.  Renal biopsy on December 11, 2005.   PERTINENT LABORATORY DATA:  ABG on unrecorded amount of oxygen shows a pH of  7.88, pCO2 of 54, pO2 of 129. CBC on admission was significant for white  blood cell count of 17,000 with 65% neutrophils, 11% lymphocytes, 12%  monocytes and 10% eosinophils. Her INR on admission was 1.4, PTT was 33, D-  dimer was 8.51. Complete metabolic panel was significant for an albumin of  1.9  otherwise unremarkable. Urine pregnancy negative. UA on December 01, 2005  showed 80 ketones, specific gravity greater than 1.046, moderate bilirubin,  large blood, greater than 300 protein, negative nitrites, negative leukocyte  esterase, hyaline cast granular cast 3 to 6 white cells, 7 to 10 red cells,  few bacteria. At discharge, her white count on prednisone was 14,000 after  having normalized prior to institution of steroids. Hemoglobin was 10.0,  hematocrit 28.2, platelet count 545,000. At discharge, her INR was 1.2, PTT  33. Bleeding time 5 minutes. Lupus anticoagulant was not detected.  Cholesterol was 89, lipase 13, LDH 133, ferritin 215, free T4 1.14. TSH  1.059. C3 complement was normal at 92. C4 was normal at 25 and C50 was 54  which is low. Haptoglobin was 357 which is high. HIV nonreactive.  Pericardial fluid had a glucose of 67, total protein of 5, amylase 22, LDH  392. It was cloudy. White count was 1220 with 96% segmented neutrophils.  A  24-hour urine for protein showed 4114 milligrams of protein. UA on December 10, 2005 showed negative ketones, negative bilirubin, small blood, negative  protein, negative nitrites, negative leukocyte esterase, few hyaline and  granular casts, 0/2 white cells, 3/6 red cells, rheumatoid factor less than  20. ANA positive and homogenous pattern with a titer of greater than 1 to  2560, double-stranded DNA antibodies were 10 which is elevated. A beta 2  glycoprotein IgG was normal at 5, beta 2 glycoprotein IgM was normal at less  than 4, beta 2 glycoprotein IgA was normal at 9, cardiolipin antibody IgG  was normal at 13, cardiolipin antibody IgM normal at 10, cardiolipin  antibody IgA normal at 2, Coxsackie virus was 1 to 1024. AntiSmith  antibodies were positive at greater than 8, anti-RNP antibodies were  positive at greater than 8. Antiviral antibodies were slightly elevated at  1.2 and TLA antibodies were negative at less than 0.2.  Scleroderma  antibodies were normal at 0.4. CD-4 count was 660 with a 45%. Influenza  antibodies IIA and B were negative. Special studies in radiology:  EKG on  admission is not currently on the chart but showed sinus tachycardia with  nonspecific changes. Chest x-ray two views showed significant enlarged  pericardial silhouette compatible with moderate to large pericardial  effusion, new small to moderate bilateral pleural effusion and bilateral  lower lung atelectasis versus consolidation. CT angiogram of the chest  showed a large pericardial effusion, small to moderate bilateral pleural  effusions with left lower lobe and right middle lobe  consolidation/atelectasis, mildly enlarged bilateral axillary, mediastinal  and pericardial lymph nodes. Chest x-ray was done serially and on the 14th  showed slightly enlarged bilateral pleural effusions with worsening  bibasilar atelectasis/consolidation, improvement in cardiac silhouette size.  Renal ultrasound showed enlarged kidneys bilaterally measuring both about  13.8 cm.   HISTORY AND HOSPITAL COURSE:  [name] is a very pleasant 30 year old  unassigned black female who presented with shortness of breath, nausea,  vomiting, cough. Please see other dictations for full details. She also had  pleuritic left chest pain. She had a temperature of 100.6, pulse was 136,  respiratory rate 22, oxygen saturation 93% on room air. She had diminished  breath sounds at the bases. JVD regular but rapid heart rate and otherwise  unremarkable examination.   She was admitted to the hospitalist service and underwent an urgent  pericardiocentesis by Dr. Johnsie Cancel as the echocardiogram showed evidence of  tamponade and the patient was symptomatic. She had a drain left in place for  24 hours and then removed. Initially, due to the purulent pericardial  effusion, infectious disease was consulted. The patient had been started on antibiotics on admission as she  showed evidence of pneumonia on chest x-ray  and CAT scan as well as by symptoms. She had been started on Rocephin and  azithromycin. Vancomycin was started by Dr. Johnnye Sima and azithromycin was  stopped. She was placed in respiratory isolation to rule out AFB. All  cultures are negative to date. She did have laboratories consistent with  lupus as was the history. The antibiotics were stopped. Patient's shortness  of breath, tachycardia, chest pain as well as cough improved. Also her  nausea, vomiting, diarrhea improved. Repeat echocardiogram on December 04, 2005 showed moderate pericardial effusion which showed some degree  proximally 25% of change in the mitral inflow pattern with respiration  suggesting continued hemodynamic significance of the effusion versus  possible pericardial constriction.  Echocardiogram on the 9th showed vigorous  ejection fraction of 65-75% without any wall motion abnormalities and had  not changed significantly from the 8th. Dr. Marveen Reeks was consulted for the  new diagnosis of lupus. He recommended prednisone 60 milligrams a day and a  nephrology consult for the proteinuria. Nephrology had concurred with the  prednisone recommendation and did perform a renal biopsy. The results at  this time are pending. The patient had stable hemoglobin, hematocrit and  symptoms after being on bedrest after the biopsy.   She is instructed to return to the emergency room or call physician  immediately for worsening of her shortness of breath. She was instructed to  avoid cigarette smoking and use contraceptive method other than hormonal  possibly Barrier.   She has no primary care physician but think she may follow up with [facility]. I recommend in addition to follow ups of the biopsy  and her pericardial effusion, following the pleural effusions with a chest x-  ray in a few weeks, sooner if more symptomatic.   The patient will need to be on prednisone 60  milligrams daily for about a  month and the rapidity of taper is and potentially other medications is  based on the results of her renal biopsy. Total time on the day of discharge  is 45 minutes.      Corinna L. Conley Canal, MD  Electronically Signed     CLS/MEDQ  D:  12/12/2005  T:  12/12/2005  Job:  [id number]   cc:   Jenkins Rouge, M.D.  [address]  [address]  [place]  [place] [address]   Anson Oregon, M.D.  Fax: [phone]   Donato Heinz, M.D.  Fax: [phone]   Michel Bickers, M.D.  Fax: [phone]

## 2011-05-15 NOTE — Consult Note (Signed)
NAMESUMAIA, VELADOR NO.:  [id number]   MEDICAL RECORD NO.:  [id number]          PATIENT TYPE:  INP   LOCATION:  4706                         FACILITY:  [facility]   PHYSICIAN:  Anson Oregon, M.D.DATE OF BIRTH:  [birth year]   DATE OF CONSULTATION:  12/08/2005  DATE OF DISCHARGE:                                   CONSULTATION   REFERRING PHYSICIAN:  Dr. Montey Hora L. Conley Canal.   REASON FOR CONSULTATION:  [name] is a 30 year old unemployed African  American female, mother of an 72-year-old child who was admitted to the  hospital on the 8th for treatment pericardial effusion with tamponade.   The patient has had a week's history of pain in the anterior chest with  intermittent nausea and vomiting, shortness of breath and dyspnea on  exertion.  The patient came to the emergency room on the 5th of December and  was told that she might have an upper respiratory infection.  She did return  to the emergency room on the 8th when it became apparent that she had more  difficulty breathing.  It was at that time that her chest x-ray revealed  that she had a large cardiac silhouette and CT scanning did confirm the  presence of a large pericardial effusion, small pleural effusions and mild  degree of adenopathy in the chest.   The patient did undergo pericardiocentesis on the 8th.  She has had marked  improvement of the shortness of breath, although she does have some residual  chest wall discomfort.   The patient has had recurrent pain and swelling of the upper extremity  joints over the last few months.  She has had no significant arthritic  problems prior to that.  She has had a family doctor in the community who  has left.  She has not seen a doctor recently for an evaluation of her joint  discomfort.   The patient has not had a recent history of skin rash, mouth sore, eye  problem, significant abdominal problems, peripheral numbness or evidence of  Raynaud's  phenomenon.   The patient is taking an over-the-counter fertility drug.  She is the mother  of an 65-year-old child, although was seeking to have another child soon.   ALLERGIES:  The patient does have a history of allergy to TOBRAMYCIN EYE  DROPS.  Otherwise, she has no significant allergic history.   SOCIAL HISTORY:  She does smoke approximately 5 cigarettes per day.  She  does not use alcohol.   FAMILY HISTORY:  The patient does have a maternal aunt with lupus.   REVIEW OF SYSTEMS:  The patient's last normal menstrual period was on the  28th of November.   LABORATORY STUDIES:  Laboratory studies during the hospitalization revealed  recently a white cell count of 7400, hemoglobin of 9.9, platelet count of  495,000; BUN 9, creatinine 0.8, SGPT 15, SGOT 13, albumin 1.4; TSH 1.059;  negative pregnancy test; nonreactive HIV study; a negative rheumatoid  factor, positive ANA at 1:2560 with a homogeneous pattern, DNA antibody that  was positive at 10, [place] and  RNP antibodies were positive at greater than  8, slightly positive SSA at 1.2, negative SSB and Scl-70 antibodies.   Analysis of fluid drained from the pericardium revealed LDH of 392 with the  normal being 3 to 23.  So far, cultures have been negative.  Twenty-four-  hour urine collection for protein revealed total protein of 4114 mg per 24  hours.  The urinalysis did reveal red cells from 7-10 with granular casts,  according to the report.   PHYSICAL EXAMINATION:  GENERAL:  On physical exam, the patient appears to be  well-oriented.  EXTREMITIES:  Examination of the extremities revealed good hand grip  bilaterally.  There was no swelling of the MCP or PIP joints.  Wrist, elbow  and shoulder exam appeared normal.  The ankle and foot exam revealed good  motion without swelling.  SKIN:  Exam revealed no rash on the trunk, face or extremities.  NEUROLOGIC:  Muscle-strength testing revealed good strength in the upper and  lower  extremity muscle groups.   ASSESSMENT:  The patient has had a history of pleuropericarditis acutely.  She does give a 28-month history of pain and swelling of the upper extremity  joints.  Serological studies suggest that she does have lupus with strongly  positive ANA and DNA antibodies as well as Smith and RNP antibodies.  Complement studies have not yet returned.  The patient does have a  significant degree of proteinuria with a very low blood albumin level.  Certainly, she has had a nephrotic syndrome.  It is difficult to say how  long she has had this problem.  Because of the significance of the  proteinuria and the evidence of blood in the urine, I would think that a  kidney biopsy would be worthwhile to help in the selection of an appropriate  immunotherapy for the long-term.  At this point I would recommend that she  be placed on high-dose prednisone at 60 mg daily.  The patient did go  through clotting studies including PTT, beta 2 glycoprotein 1 and  anticardiolipin antibodies.  Certainly, this is quite appropriate.  I do  feel that a nephrologist could offer good recommendations concerning the  management of the glomerulonephritis, particularly after the biopsy report  is in.  The patient will require a Family Practice physician to help manage  general medical problems and to offer advice in terms of contraception.  I  did recommend to the patient that she not become pregnant at this time.  Certainly, pregnancy could be quite dangerous to both the patient and the  fetus.  She was made aware of this, particularly since she may be placed on  potent immunotherapies.  I did discuss potential side-effects of medication  with her.           ______________________________  Anson Oregon, M.D.     JJZ/MEDQ  D:  12/09/2005  T:  12/11/2005  Job:  [id number]

## 2011-05-15 NOTE — Consult Note (Signed)
[name], ARKWRIGHT NO.:  [id number]   MEDICAL RECORD NO.:  [id number]          PATIENT TYPE:  INP   LOCATION:  4706                         FACILITY:  [facility]   PHYSICIAN:  Donato Heinz, M.D.DATE OF BIRTH:  July 14, 1981   DATE OF CONSULTATION:  12/09/2005  DATE OF DISCHARGE:                                   CONSULTATION   REFERRING PHYSICIAN:  Dr. Montey Hora L. Conley Canal.   REASON FOR REFERRAL:  Lupus nephritis.   HISTORY OF PRESENT ILLNESS:  Ms. Lasek is a 30 year old African American  female who did not have a significant past medical history other than  eclampsia on a previous pregnancy and presented to [facility] on  December 04, 2005 complaining of increasing shortness of breath.  She was  found to have a large pericardial effusion with tamponade and underwent  pericardiocentesis which revealed a purulent exudative fluid.  Workup ensued  and was significant for a negative AFG, elevated white blood cell count in  her pericardial effusion.  She had a positive double-stranded DNA, a  positive ANA, a positive anti-Smith, positive anti-RNP and a positive SSA.  She also underwent a 24-hour urine collection which was significant for 4 g  of protein per 24 hours and she was given a new diagnosis of systemic lupus  erythematosus that was complicated by pericarditis with effusion as well as  pleuritis and pleural effusion.  We have been asked to evaluate the patient  for possible renal biopsy.  The patient denies any previous knowledge of  lupus, but does have a strong family history of lupus.   ALLERGIES:  Her allergies include TOBRAMYCIN EYE DROPS which cause a rash  and swelling of her face.   PAST MEDICAL HISTORY:  1.  Eclampsia from her first pregnancy in 81.  2.  New diagnosis of lupus.  3.  Nephrotic-range proteinuria, 4 g per 24 hours, as well an active urine      sediment with large blood and greater than 300 protein.   CURRENT  MEDICATIONS:  1.  Protonix 40 mg a day.  2.  Potassium chloride 40 mEq 3 times daily.  3.  Prednisone 60 mg a day.  4.  Tylenol q.4 h. p.r.n.  5.  Phenergan p.r.n.  6.  Imodium p.r.n.  7.  Ambien p.r.n.  8.  Dilaudid p.r.n.   FAMILY HISTORY:  Her mother is alive at age 81 in good health.  Father is  alive at age 99 in good health.  She identical twin sisters, age 48, in good  health.  She does have 2 maternal aunts and 1 maternal uncle with lupus.   SOCIAL HISTORY:  She lives with her significant-other.  She has an 8-year-  old daughter.  She is a Agricultural engineer.  She smokes 3-4 cigarettes a day.  She  admits to smoking marijuana every other day, has rare alcohol, denies IV  drug use.   REVIEW OF SYSTEMS:  GENERAL:  She denies any anorexia or malaise.  OPHTHALMIC:  No blurred vision or photophobia.  CARDIAC:  No chest pain or  palpitations.  PULMONARY:  Resolved shortness of breath.  No hemoptysis or  productive cough.  GI:  She does report diarrhea, but no hematochezia,  melena or bright red blood per rectum.  GU:  No dysuria, pyuria, hematuria,  urgency, frequency or retention.  RHEUMATOLOGIC:  She does have some  bilateral hand swelling and arthralgias.  DERMATOLOGIC:  Denies any rashes,  lumps or bumps.  HEMATOLOGIC:  No abnormal bleeding or bruising.  All other  systems negative.   PHYSICAL EXAMINATION:  GENERAL:  This is a well-developed, well-nourished  female in no apparent distress.  VITAL SIGNS:  Temperature 98.8, pulse 109, blood pressure 116/81,  respiratory rate 24.  HEENT:  Head normocephalic, atraumatic.  Pupils are equal, round and  reactive to light.  Extraocular muscles intact.  No icterus.  Oropharynx was  without lesion.  NECK:  Neck was supple with full range of motion, no lymphadenopathy, no  bruits.  LUNGS:  Clear to auscultation and percussion bilaterally.  No rales.  No  rhonchi.  CARDIAC:  She was tachycardic.  No precordial rub appreciated.  ABDOMEN:   Normoactive bowel sounds.  Soft, nontender and non-distended.  No  guarding or rebound.  No bruits.  EXTREMITIES:  She has 1+ pretibial edema bilaterally.  No embolic changes.  Pedal pulses 1+ bilaterally.  DERMATOLOGIC:  No rashes.   LABORATORY DATA:  On December 01, 2005, her urinalysis had large blood,  greater than 300 protein, 3-6 white cells, 7-10 red blood cells.  Most  recent labs were from December 08, 2005:  Hemoglobin 9.9, white blood cell  count 7.8, platelets 495,000.  Sodium 135, potassium 3.3, chloride 105, CO2  28, BUN 9, creatinine 0.8, glucose 82.  Twenty-four-hour urine protein was  significant for 4 g of protein.  Her total cholesterol was 85.   ASSESSMENT AND PLAN:  1.  Lupus nephritis.  The patient did have an active urine sediment      predating her admission and this is most consistent with lupus nephritis      and not a urinary tract infection.  I discussed the risks and benefits      with renal biopsy and the patient has agreed to proceed with a renal      biopsy.  In the meantime, we will check a bleeding time and will repeat      her urinalysis and try to schedule with the ultrasound department.      However, this should not delay her discharge.  If able to do the biopsy      tomorrow, we will proceed; otherwise, we can reschedule as an outpatient      and we will continue with prednisone for now and await renal biopsy.  2.  Systemic lupus erythematosus.  She is on 60 mg a day.  Would recommend a      Rheumatology consult.  3.  Anemia, likely secondary to #2.  I will continue to follow.  4.  Hypokalemia.  She is on replacement.  We will recheck her potassium in      the morning.  5.  Will continue to follow along.  6.  We will also check a urine beta hCG to rule out pregnancy.   Thanks for this consult.           ______________________________  Donato Heinz, M.D.     JC/MEDQ  D:  12/09/2005  T:  12/11/2005  Job:  [id number]

## 2011-05-15 NOTE — Consult Note (Signed)
[name], [name] NO.:  [id number]   MEDICAL RECORD NO.:  [id number]          PATIENT TYPE:  INP   LOCATION:  1846                         FACILITY:  [facility]   PHYSICIAN:  Jenkins Rouge, M.D.     DATE OF BIRTH:  [birth year]   DATE OF CONSULTATION:  DATE OF DISCHARGE:                                   CONSULTATION   HISTORY:  [name] is a 30 year old patient seen in the minor side of the  emergency room tonight.  She was seen 3 days ago and treated for a UTI.   She had increasing shortness of breath.  Her chest x-ray showed cardiac  enlargement with bilateral pleural effusions.  CT scan showed a large  pericardial effusion with question of supraclavicular and axillary  adenopathy.   In talking to the patient she has been increasingly ill over the last week  or so.  She has had low-grade temperature and some nausea and vomiting.   She has no previous real significant medical history.  She has an 61-year-old  daughter, but outside of this has been fairly healthy.  She has older  sisters who are healthy.  There is no history of premature cancer.  There is  no history of lymphoma.  The patient has no history of previous thyroid  disease or connective tissue disease.  There is no arthritide.  She has not  had any significant rash.  She does smoke marijuana on occasion.   REVIEW OF SYSTEMS:  Remarkable for increasing shortness of breath.  She has  PND and orthopnea.  There has been no significant chest pain.   MEDICATIONS:  The only medicines that she has been taking recently have been  Bactrim for her UTI.   LAB WORK:  Pending. I asked the emergency room to add a TSH, T4,  sedimentation rate, ANA to her lab work in regards to a diagnosis for her  pleural pericarditis.   PHYSICAL EXAMINATION:  GENERAL:  On exam she is more comfortable sitting  upright.  NECK:  Her JVP is elevated.  There are positive pulses.  VITAL SIGNS:  Her blood pressure is about [id number]  systolic.  She is tachycardiac  in sinus rhythm at about 115 to 120.  LUNGS:  She has decreased bilateral breath sounds left greater than right.  HEART:  There is an S1-S2 with distant heart sounds.  ABDOMEN:  Benign.  EXTREMITIES:  Lower extremities have intact pulses and no edema.   EKG:  Shows sinus tachycardia with nonspecific ST-T wave changes.   CHEST X-RAY:  Shows marked cardiomegaly, particularly compared to a chest x-  ray done earlier this year, I believe, in February.   CT SCAN:  Shows a large pericardial effusion with axillary and  supraclavicular adenopathy.  She has bilateral pleural effusions with  compressive atelectasis, left greater than right.   I did a quick bedside echo on the patient and this confirms a large  pericardial effusion with RV compression.  The effusion would appear to be  accessible for percutaneous drainage either apically or subcostally.  IMPRESSION:  The patient will be admitted by Dr. Conley Canal in regards to  followup therapy.  I discussed the risks of pericardial centesis with the  patient including bleeding, puncture of the heart, need for further  emergency open procedure.   She understands the need to proceed with this for both diagnostic and  therapeutic reasons. We will make arrangements for the patient to be up in  the catheterization no later than 1-1/2 hours.  I would like to wait to make  sure that her labs are back and her coags are in order.  She would like to  call her boyfriend and her mother as well.  The mother is watching the  patient's 78-year-old child.  She is 36 years old and a consenting adult and  she has signed consent for pericardial centesis.           ______________________________  Jenkins Rouge, M.D.     PN/MEDQ  D:  12/04/2005  T:  12/04/2005  Job:  [id number]

## 2011-05-15 NOTE — Cardiovascular Report (Signed)
[name], MEHRHOFF NO.:  [id number]   MEDICAL RECORD NO.:  [id number]          PATIENT TYPE:  INP   LOCATION:  1846                         FACILITY:  [facility]   PHYSICIAN:  Jenkins Rouge, M.D.     DATE OF BIRTH:  Oct 09, 1981   DATE OF PROCEDURE:  12/04/2005  DATE OF DISCHARGE:                              CARDIAC CATHETERIZATION   PROCEDURE:  Pericardiocentesis.   HISTORY:  Ms. Clyburn is a 30 year old patient who came to the emergency room  with increasing shortness of breath. She was here  3 days ago and treated  for UTI.  A chest x-ray showed severe cardiomegaly.  The CAT scan showed  large pericardial effusion.   HOSPITAL COURSE:  I did a bedside echocardiogram on the patient, which  showed large pericardial effusion with tamponade.  Clinically she was also  in tamponade with __________ small sign, pulsus paradoxus and elevated JVP.  She could not lay flat. She was brought then emergently to the cardiac  catheterization lab. With echocardiographic guidance, a 5.25 inch  14-gauge  angiocatheter was advanced from the subxiphoid position into the pericardial  space.  Pericardial space location was documented both by free wire and  agitated saline injection.   The 5.25 inch  angiocatheter was then exchanged out over wire for a 6-French  sheath.  A 5-French long pigtail was then advanced and the sheath removed.  Approximately 600 cc of yellow serosanguineous fluid was removed.  The  patient's blood pressure increased from [id number] systolic.   Heart rate remained elevated at 117.   The patient had 5 mg of morphine and 5 mg of Versed during the procedure to  make her comfortable.   DISPOSITION:  The pigtail catheter will be left in place for 24 hours.   Samples were sent for anaerobic and aerobic cultures, AFB, CBC protein, cell  count and all other appropriate labs.   The patient tolerated the procedure well.   CONCLUSION:  Successful echocardiographic-guided  pericardiocentesis.           ______________________________  Jenkins Rouge, M.D.     PN/MEDQ  D:  12/04/2005  T:  12/04/2005  Job:  [id number]

## 2011-05-15 NOTE — Assessment & Plan Note (Signed)
[facility]                              CARDIOLOGY OFFICE NOTE   NAME:[name], [name] SPEAKER                       MRN:          [id number]  DATE:08/26/2006                            DOB:          Jun 14, 1981    [name] returns today for followup.  She has got lupus with a pericardial  effusion that needed drainage.  She has had an echo in February and April  with no recurrence.  She is doing fairly well.  She continues to run  somewhat tachycardic.  I suspect this is from her anemia.  She is on  prednisone and Aricept now.  I believe the Aricept is more to preserve renal  function.  She is not having any significant chest pain, PND or orthopnea.  She continues to smoke and drink occasionally, but denies other substance  abuse.   PHYSICAL EXAMINATION:  She looks well.  The pulse is 95 and regular.  LUNGS:  Clear.  CAROTIDS:  Normal.  S1 and S2, normal heart sounds, there is no rub.  ABDOMEN:  Benign.  EXTREMITIES:  Intact pulses, no edema.   EKG is essentially normal with no evidence of pericardial inflammation.   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  Continue to follow up with Dr. Charlestine Night in terms of aggressive therapy for  this, and she is to have blood work at [facility] today to recheck her kidney  function.  I will see her back in a year.                               Wallis Bamberg. Johnsie Cancel, MD, [facility]    PCN/MedQ  DD:  08/26/2006  DT:  08/27/2006  Job #:  [id number]

## 2011-07-22 ENCOUNTER — Emergency Department (HOSPITAL_COMMUNITY)
Admission: EM | Admit: 2011-07-22 | Discharge: 2011-07-22 | Disposition: A | Discharge status: Home / Self Care | Payer: Self-pay | Attending: Emergency Medicine | Admitting: Emergency Medicine

## 2011-07-22 DIAGNOSIS — R221 Localized swelling, mass and lump, neck: Secondary | ICD-10-CM | POA: Insufficient documentation

## 2011-07-22 DIAGNOSIS — R220 Localized swelling, mass and lump, head: Secondary | ICD-10-CM | POA: Insufficient documentation

## 2011-07-22 DIAGNOSIS — K089 Disorder of teeth and supporting structures, unspecified: Secondary | ICD-10-CM | POA: Insufficient documentation

## 2011-07-22 DIAGNOSIS — M329 Systemic lupus erythematosus, unspecified: Secondary | ICD-10-CM | POA: Insufficient documentation

## 2011-09-28 LAB — COMPREHENSIVE METABOLIC PANEL
BUN: 10
CO2: 22
Calcium: 7.7 — ABNORMAL LOW
Chloride: 106
Creatinine, Ser: 0.51
GFR calc non Af Amer: >60
Glucose, Bld: 67 — ABNORMAL LOW
Total Bilirubin: 0.7

## 2011-09-28 LAB — URINALYSIS, ROUTINE W REFLEX MICROSCOPIC
Bilirubin Urine: NEGATIVE
Ketones, ur: NEGATIVE
Leukocytes, UA: NEGATIVE
Nitrite: NEGATIVE
Specific Gravity, Urine: >1.03 — ABNORMAL HIGH
Urobilinogen, UA: 0.2
pH: 7

## 2011-09-28 LAB — CBC
HCT: 24.3 — ABNORMAL LOW
HCT: 31 — ABNORMAL LOW
MCHC: 32.2
MCV: 84.7
Platelets: 174
RBC: 3.66 — ABNORMAL LOW
RDW: 13
WBC: 6.2

## 2011-09-28 LAB — LACTATE DEHYDROGENASE: LDH: 276 — ABNORMAL HIGH

## 2011-09-28 LAB — URIC ACID: Uric Acid, Serum: 4.9

## 2011-09-28 LAB — RPR: RPR Ser Ql: NONREACTIVE

## 2011-09-29 LAB — URINE MICROSCOPIC-ADD ON

## 2011-09-29 LAB — URINALYSIS, ROUTINE W REFLEX MICROSCOPIC
Bilirubin Urine: NEGATIVE
Glucose, UA: NEGATIVE
Ketones, ur: NEGATIVE
Leukocytes, UA: NEGATIVE
Nitrite: NEGATIVE
Protein, ur: >300 — AB
Specific Gravity, Urine: 1.027
Urobilinogen, UA: 1
pH: 6.5

## 2011-09-29 LAB — COMPREHENSIVE METABOLIC PANEL WITH GFR
ALT: 12
AST: 20
Alkaline Phosphatase: 52
CO2: 24
Chloride: 104
GFR calc Af Amer: >60
GFR calc non Af Amer: 57 — ABNORMAL LOW
Sodium: 134 — ABNORMAL LOW
Total Bilirubin: 0.5

## 2011-09-29 LAB — CULTURE, BLOOD (ROUTINE X 2)
Culture: NO GROWTH
Culture: NO GROWTH

## 2011-09-29 LAB — COMPREHENSIVE METABOLIC PANEL
Albumin: 1.1 — ABNORMAL LOW
BUN: 9
Calcium: 7.3 — ABNORMAL LOW
Creatinine, Ser: 1.16
Glucose, Bld: 97
Potassium: 2.6 — CL
Total Protein: 6.6

## 2011-09-29 LAB — CBC
HCT: 31.3 — ABNORMAL LOW
Hemoglobin: 10.2 — ABNORMAL LOW
MCHC: 32.6
MCV: 82.1
Platelets: 330
RBC: 3.81 — ABNORMAL LOW
RDW: 12.4
WBC: 10.3

## 2011-09-29 LAB — URINE CULTURE: Colony Count: 25000

## 2011-09-29 LAB — DIFFERENTIAL
Basophils Absolute: 0
Basophils Relative: 0
Eosinophils Absolute: 0
Eosinophils Relative: 0
Lymphocytes Relative: 18
Lymphs Abs: 1.8
Monocytes Absolute: 1.1 — ABNORMAL HIGH
Monocytes Relative: 11
Neutro Abs: 7.3
Neutrophils Relative %: 72

## 2011-09-29 LAB — RAPID STREP SCREEN (MED CTR MEBANE ONLY): Streptococcus, Group A Screen (Direct): NEGATIVE

## 2012-07-05 ENCOUNTER — Encounter (HOSPITAL_COMMUNITY): Payer: Self-pay | Admitting: Emergency Medicine

## 2012-07-05 ENCOUNTER — Emergency Department (HOSPITAL_COMMUNITY)
Admission: EM | Admit: 2012-07-05 | Discharge: 2012-07-05 | Disposition: A | Discharge status: Home / Self Care | Payer: Self-pay | Attending: Emergency Medicine | Admitting: Emergency Medicine

## 2012-07-05 DIAGNOSIS — M329 Systemic lupus erythematosus, unspecified: Secondary | ICD-10-CM | POA: Insufficient documentation

## 2012-07-05 DIAGNOSIS — N390 Urinary tract infection, site not specified: Secondary | ICD-10-CM | POA: Insufficient documentation

## 2012-07-05 DIAGNOSIS — M549 Dorsalgia, unspecified: Secondary | ICD-10-CM | POA: Insufficient documentation

## 2012-07-05 DIAGNOSIS — R109 Unspecified abdominal pain: Secondary | ICD-10-CM | POA: Insufficient documentation

## 2012-07-05 LAB — POCT I-STAT, CHEM 8
HCT: 31 % — ABNORMAL LOW (ref 36.0–46.0)
Hemoglobin: 10.5 g/dL — ABNORMAL LOW (ref 12.0–15.0)
Potassium: 4.4 mEq/L (ref 3.5–5.1)
Sodium: 140 mEq/L (ref 135–145)
TCO2: 20 mmol/L (ref 0–100)

## 2012-07-05 LAB — URINE MICROSCOPIC-ADD ON

## 2012-07-05 LAB — URINALYSIS, ROUTINE W REFLEX MICROSCOPIC
Bilirubin Urine: NEGATIVE
Glucose, UA: NEGATIVE mg/dL
Ketones, ur: NEGATIVE mg/dL
Protein, ur: >300 mg/dL — AB
pH: 6 (ref 5.0–8.0)

## 2012-07-05 MED ORDER — NITROFURANTOIN MONOHYD MACRO 100 MG PO CAPS: 100.0000 mg | ORAL_CAPSULE | Freq: Two times a day (BID) | ORAL | Status: AC

## 2012-07-05 NOTE — ED Notes (Signed)
Pt c/o left flank pain x weeks that is worse today; pt sts hx of lupus and feels like lupus pains

## 2012-07-05 NOTE — ED Provider Notes (Signed)
History     CSN: [id number]  Arrival date & time 07/05/12  1620   First MD Initiated Contact with Patient 07/05/12 2005      Chief Complaint  Patient presents with  . Flank Pain    HPI  History provided by the patient. Patient is a 31 year old female with history of lupus who presents with complaints of lower back and left flank pains for the past week. Patient reports having history of the same pains related to lupus. Patient states pain feels slightly different and increased. She denies having any prior history of kidney stones and she denies any dysuria or hematuria reports some increased urinary frequency especially at night. Symptoms are not associated with any fever, chills, sweats, nausea or vomiting. She denies any aggravating or alleviating factors. Pain does not radiate.    Past Medical History  Diagnosis Date  . Lupus     History reviewed. No pertinent past surgical history.  History reviewed. No pertinent family history.  History  Substance Use Topics  . Smoking status: Current Everyday Smoker  . Smokeless tobacco: Not on file  . Alcohol Use: Yes    OB History    Grav Para Term Preterm Abortions TAB SAB Ect Mult Living                  Review of Systems  Constitutional: Negative for fever and chills.  Gastrointestinal: Negative for nausea, vomiting, abdominal pain and constipation.  Genitourinary: Positive for frequency and flank pain. Negative for dysuria, hematuria, vaginal bleeding and vaginal discharge.  Musculoskeletal: Positive for back pain.    Allergies  Tobramycin sulfate  Home Medications   Current Outpatient Rx  Name Route Sig Dispense Refill  . IBUPROFEN 200 MG PO TABS Oral Take 400 mg by mouth every 6 (six) hours as needed. For pain    . MELATONIN PO Oral Take 1 tablet by mouth at bedtime as needed. For sleep      BP 133/80  Pulse 61  Temp 98.2 F (36.8 C) (Oral)  Resp 19  SpO2 100%  LMP 06/11/2012  Physical Exam  Nursing  note and vitals reviewed. Constitutional: She is oriented to person, place, and time. She appears well-developed and well-nourished. No distress.  HENT:  Head: Normocephalic.  Cardiovascular: Normal rate and regular rhythm.   Pulmonary/Chest: Effort normal and breath sounds normal.  Abdominal: Soft. There is no tenderness. There is no rebound, no guarding and no CVA tenderness.  Neurological: She is alert and oriented to person, place, and time.  Skin: Skin is warm and dry. No rash noted.  Psychiatric: She has a normal mood and affect. Her behavior is normal.    ED Course  Procedures   Results for orders placed during the hospital encounter of 07/05/12  URINALYSIS, ROUTINE W REFLEX MICROSCOPIC      Component Value Range   Color, Urine YELLOW  YELLOW   APPearance CLOUDY (*) CLEAR   Specific Gravity, Urine 1.018  1.005 - 1.030   pH 6.0  5.0 - 8.0   Glucose, UA NEGATIVE  NEGATIVE mg/dL   Hgb urine dipstick MODERATE (*) NEGATIVE   Bilirubin Urine NEGATIVE  NEGATIVE   Ketones, ur NEGATIVE  NEGATIVE mg/dL   Protein, ur >300 (*) NEGATIVE mg/dL   Urobilinogen, UA 0.2  0.0 - 1.0 mg/dL   Nitrite POSITIVE (*) NEGATIVE   Leukocytes, UA SMALL (*) NEGATIVE  POCT PREGNANCY, URINE      Component Value Range   Preg Test,  Ur NEGATIVE  NEGATIVE  URINE MICROSCOPIC-ADD ON      Component Value Range   Squamous Epithelial / LPF FEW (*) RARE   WBC, UA 7-10  <3 WBC/hpf   RBC / HPF 3-6  <3 RBC/hpf   Bacteria, UA MANY (*) RARE   Casts HYALINE CASTS (*) NEGATIVE  POCT I-STAT, CHEM 8      Component Value Range   Sodium 140  135 - 145 mEq/L   Potassium 4.4  3.5 - 5.1 mEq/L   Chloride 111  96 - 112 mEq/L   BUN 29 (*) 6 - 23 mg/dL   Creatinine, Ser 1.20 (*) 0.50 - 1.10 mg/dL   Glucose, Bld 77  70 - 99 mg/dL   Calcium, Ion 1.23  1.12 - 1.23 mmol/L   TCO2 20  0 - 100 mmol/L   Hemoglobin 10.5 (*) 12.0 - 15.0 g/dL   HCT 31.0 (*) 36.0 - 46.0 %        1. UTI (lower urinary tract infection)        MDM  8:05PM patient seen and evaluated. Patient in no acute distress. No significant CVA tenderness. Urine with some signs concerning for UTI. Labs unremarkable and at baseline with baseline creatinine.  At this time we'll plan to treat for possible UTI with antibiotics. Patient instructed to followup with primary care provider and rheumatology specialist.       Martie Lee, [place] 07/06/12 [phone]

## 2012-07-06 NOTE — ED Provider Notes (Signed)
Medical screening examination/treatment/procedure(s) were performed by non-physician practitioner and as supervising physician I was immediately available for consultation/collaboration.    Kathalene Frames, MD 07/06/12 [phone]

## 2013-01-17 ENCOUNTER — Inpatient Hospital Stay (HOSPITAL_COMMUNITY): Payer: Medicaid Other

## 2013-01-17 ENCOUNTER — Emergency Department (HOSPITAL_COMMUNITY): Payer: Medicaid Other

## 2013-01-17 ENCOUNTER — Encounter (HOSPITAL_COMMUNITY): Payer: Self-pay | Admitting: Internal Medicine

## 2013-01-17 ENCOUNTER — Inpatient Hospital Stay (HOSPITAL_COMMUNITY)
Admission: EM | Admit: 2013-01-17 | Discharge: 2013-01-21 | DRG: 603 | Disposition: A | Discharge status: Home / Self Care | Payer: Medicaid Other | Attending: Internal Medicine | Admitting: Internal Medicine

## 2013-01-17 DIAGNOSIS — Z9889 Other specified postprocedural states: Secondary | ICD-10-CM

## 2013-01-17 DIAGNOSIS — N19 Unspecified kidney failure: Secondary | ICD-10-CM

## 2013-01-17 DIAGNOSIS — L0201 Cutaneous abscess of face: Principal | ICD-10-CM | POA: Diagnosis present

## 2013-01-17 DIAGNOSIS — A599 Trichomoniasis, unspecified: Secondary | ICD-10-CM | POA: Diagnosis present

## 2013-01-17 DIAGNOSIS — N058 Unspecified nephritic syndrome with other morphologic changes: Secondary | ICD-10-CM | POA: Diagnosis present

## 2013-01-17 DIAGNOSIS — N049 Nephrotic syndrome with unspecified morphologic changes: Secondary | ICD-10-CM | POA: Diagnosis present

## 2013-01-17 DIAGNOSIS — D649 Anemia, unspecified: Secondary | ICD-10-CM | POA: Diagnosis present

## 2013-01-17 DIAGNOSIS — F1910 Other psychoactive substance abuse, uncomplicated: Secondary | ICD-10-CM

## 2013-01-17 DIAGNOSIS — F1290 Cannabis use, unspecified, uncomplicated: Secondary | ICD-10-CM

## 2013-01-17 DIAGNOSIS — N179 Acute kidney failure, unspecified: Secondary | ICD-10-CM | POA: Diagnosis present

## 2013-01-17 DIAGNOSIS — B954 Other streptococcus as the cause of diseases classified elsewhere: Secondary | ICD-10-CM | POA: Diagnosis present

## 2013-01-17 DIAGNOSIS — Z8709 Personal history of other diseases of the respiratory system: Secondary | ICD-10-CM

## 2013-01-17 DIAGNOSIS — R809 Proteinuria, unspecified: Secondary | ICD-10-CM

## 2013-01-17 DIAGNOSIS — M3214 Glomerular disease in systemic lupus erythematosus: Secondary | ICD-10-CM | POA: Diagnosis present

## 2013-01-17 DIAGNOSIS — F1410 Cocaine abuse, uncomplicated: Secondary | ICD-10-CM | POA: Diagnosis present

## 2013-01-17 DIAGNOSIS — L03211 Cellulitis of face: Principal | ICD-10-CM | POA: Diagnosis present

## 2013-01-17 DIAGNOSIS — M329 Systemic lupus erythematosus, unspecified: Secondary | ICD-10-CM | POA: Diagnosis present

## 2013-01-17 DIAGNOSIS — F1210 Cannabis abuse, uncomplicated: Secondary | ICD-10-CM | POA: Diagnosis present

## 2013-01-17 DIAGNOSIS — R7881 Bacteremia: Secondary | ICD-10-CM | POA: Diagnosis present

## 2013-01-17 DIAGNOSIS — K029 Dental caries, unspecified: Secondary | ICD-10-CM | POA: Diagnosis present

## 2013-01-17 DIAGNOSIS — Z9119 Patient's noncompliance with other medical treatment and regimen: Secondary | ICD-10-CM

## 2013-01-17 DIAGNOSIS — F17200 Nicotine dependence, unspecified, uncomplicated: Secondary | ICD-10-CM | POA: Diagnosis present

## 2013-01-17 DIAGNOSIS — Z8679 Personal history of other diseases of the circulatory system: Secondary | ICD-10-CM

## 2013-01-17 DIAGNOSIS — Z91199 Patient's noncompliance with other medical treatment and regimen due to unspecified reason: Secondary | ICD-10-CM

## 2013-01-17 DIAGNOSIS — A5901 Trichomonal vulvovaginitis: Secondary | ICD-10-CM

## 2013-01-17 DIAGNOSIS — R197 Diarrhea, unspecified: Secondary | ICD-10-CM | POA: Diagnosis present

## 2013-01-17 DIAGNOSIS — L03213 Periorbital cellulitis: Secondary | ICD-10-CM

## 2013-01-17 DIAGNOSIS — R112 Nausea with vomiting, unspecified: Secondary | ICD-10-CM | POA: Diagnosis present

## 2013-01-17 HISTORY — DX: Glomerular disease in systemic lupus erythematosus: M32.14

## 2013-01-17 HISTORY — DX: Other specified postprocedural states: Z98.890

## 2013-01-17 HISTORY — DX: Personal history of other diseases of the circulatory system: Z86.79

## 2013-01-17 HISTORY — DX: Personal history of other diseases of the respiratory system: Z87.09

## 2013-01-17 HISTORY — DX: Systemic lupus erythematosus, unspecified: M32.9

## 2013-01-17 LAB — BASIC METABOLIC PANEL
BUN: 35 mg/dL — ABNORMAL HIGH (ref 6–23)
Chloride: 100 mEq/L (ref 96–112)
GFR calc Af Amer: 34 mL/min — ABNORMAL LOW (ref >90)
GFR calc non Af Amer: 29 mL/min — ABNORMAL LOW (ref >90)
Potassium: 4.5 mEq/L (ref 3.5–5.1)
Sodium: 129 mEq/L — ABNORMAL LOW (ref 135–145)

## 2013-01-17 LAB — LIPID PANEL
Cholesterol: 103 mg/dL (ref 0–200)
LDL Cholesterol: 52 mg/dL (ref 0–99)
Total CHOL/HDL Ratio: 3.4 RATIO
VLDL: 21 mg/dL (ref 0–40)

## 2013-01-17 LAB — CBC
HCT: 29.5 % — ABNORMAL LOW (ref 36.0–46.0)
Hemoglobin: 10 g/dL — ABNORMAL LOW (ref 12.0–15.0)
RBC: 3.61 MIL/uL — ABNORMAL LOW (ref 3.87–5.11)
RDW: 13.7 % (ref 11.5–15.5)
WBC: 19.8 10*3/uL — ABNORMAL HIGH (ref 4.0–10.5)

## 2013-01-17 LAB — CBC WITH DIFFERENTIAL/PLATELET
Basophils Relative: 0 % (ref 0–1)
Eosinophils Absolute: 0 10*3/uL (ref 0.0–0.7)
Hemoglobin: 10.9 g/dL — ABNORMAL LOW (ref 12.0–15.0)
Lymphocytes Relative: 6 % — ABNORMAL LOW (ref 12–46)
MCHC: 33.4 g/dL (ref 30.0–36.0)
Monocytes Relative: 7 % (ref 3–12)
Neutrophils Relative %: 87 % — ABNORMAL HIGH (ref 43–77)
RBC: 3.99 MIL/uL (ref 3.87–5.11)
WBC: 18.4 10*3/uL — ABNORMAL HIGH (ref 4.0–10.5)

## 2013-01-17 LAB — URINALYSIS, ROUTINE W REFLEX MICROSCOPIC
Glucose, UA: NEGATIVE mg/dL
Protein, ur: >300 mg/dL — AB
Specific Gravity, Urine: 1.017 (ref 1.005–1.030)
Urobilinogen, UA: 1 mg/dL (ref 0.0–1.0)

## 2013-01-17 LAB — HEPATIC FUNCTION PANEL
ALT: 5 U/L (ref 0–35)
AST: 10 U/L (ref 0–37)
Albumin: 1.2 g/dL — ABNORMAL LOW (ref 3.5–5.2)
Bilirubin, Direct: <0.1 mg/dL (ref 0.0–0.3)
Total Protein: 6.5 g/dL (ref 6.0–8.3)

## 2013-01-17 LAB — URINE MICROSCOPIC-ADD ON

## 2013-01-17 LAB — RAPID URINE DRUG SCREEN, HOSP PERFORMED
Cocaine: POSITIVE — AB
Opiates: NOT DETECTED

## 2013-01-17 LAB — CREATININE, SERUM
GFR calc Af Amer: 40 mL/min — ABNORMAL LOW (ref >90)
GFR calc non Af Amer: 34 mL/min — ABNORMAL LOW (ref >90)

## 2013-01-17 LAB — PREGNANCY, URINE: Preg Test, Ur: NEGATIVE

## 2013-01-17 LAB — PROTEIN, URINE, RANDOM: Total Protein, Urine: 537 mg/dL

## 2013-01-17 LAB — RPR: RPR Ser Ql: NONREACTIVE

## 2013-01-17 MED ORDER — ONDANSETRON HCL 4 MG PO TABS: 4.0000 mg | ORAL_TABLET | Freq: Four times a day (QID) | ORAL | Status: DC | PRN

## 2013-01-17 MED ORDER — VANCOMYCIN HCL 1000 MG IV SOLR
750.0000 mg | Freq: Two times a day (BID) | INTRAVENOUS | Status: DC
  Administered 2013-01-17 – 2013-01-18 (×2): 750 mg via INTRAVENOUS
  Filled 2013-01-17 (×3): qty 750

## 2013-01-17 MED ORDER — SODIUM CHLORIDE 0.9 % IV BOLUS (SEPSIS)
1000.0000 mL | Freq: Once | INTRAVENOUS | Status: AC
  Administered 2013-01-17: 1000 mL via INTRAVENOUS

## 2013-01-17 MED ORDER — SODIUM CHLORIDE 0.9 % IV BOLUS (SEPSIS): 1000.0000 mL | Freq: Once | INTRAVENOUS | Status: DC

## 2013-01-17 MED ORDER — SODIUM CHLORIDE 0.9 % IV SOLN: INTRAVENOUS | Status: DC

## 2013-01-17 MED ORDER — ACETAMINOPHEN 650 MG RE SUPP: 650.0000 mg | Freq: Four times a day (QID) | RECTAL | Status: DC | PRN

## 2013-01-17 MED ORDER — FUROSEMIDE 10 MG/ML IJ SOLN
40.0000 mg | Freq: Once | INTRAMUSCULAR | Status: AC
  Administered 2013-01-17: 40 mg via INTRAVENOUS
  Filled 2013-01-17: qty 4

## 2013-01-17 MED ORDER — HEPARIN SODIUM (PORCINE) 5000 UNIT/ML IJ SOLN
5000.0000 [IU] | Freq: Three times a day (TID) | INTRAMUSCULAR | Status: DC
  Administered 2013-01-17 – 2013-01-21 (×12): 5000 [IU] via SUBCUTANEOUS
  Filled 2013-01-17 (×14): qty 1

## 2013-01-17 MED ORDER — SODIUM CHLORIDE 0.9 % IV SOLN
INTRAVENOUS | Status: DC
  Administered 2013-01-17: 150 mL/h via INTRAVENOUS
  Administered 2013-01-18: 04:00:00 via INTRAVENOUS

## 2013-01-17 MED ORDER — ONDANSETRON HCL 4 MG/2ML IJ SOLN: 4.0000 mg | Freq: Four times a day (QID) | INTRAMUSCULAR | Status: DC | PRN

## 2013-01-17 MED ORDER — DEXTROSE 5 % IV SOLN
1.0000 g | INTRAVENOUS | Status: DC
  Administered 2013-01-17 – 2013-01-18 (×2): 1 g via INTRAVENOUS
  Filled 2013-01-17 (×3): qty 10

## 2013-01-17 MED ORDER — DIPHENHYDRAMINE HCL 50 MG/ML IJ SOLN
25.0000 mg | Freq: Once | INTRAMUSCULAR | Status: AC
  Administered 2013-01-17: 25 mg via INTRAVENOUS
  Filled 2013-01-17: qty 1

## 2013-01-17 MED ORDER — SODIUM CHLORIDE 0.9 % IV SOLN
Freq: Once | INTRAVENOUS | Status: AC
  Administered 2013-01-17: 1000 mL via INTRAVENOUS

## 2013-01-17 MED ORDER — METHYLPREDNISOLONE SODIUM SUCC 40 MG IJ SOLR
40.0000 mg | Freq: Once | INTRAMUSCULAR | Status: AC
  Administered 2013-01-17: 40 mg via INTRAVENOUS
  Filled 2013-01-17: qty 1

## 2013-01-17 MED ORDER — ACETAMINOPHEN 325 MG PO TABS
650.0000 mg | ORAL_TABLET | Freq: Once | ORAL | Status: AC
  Administered 2013-01-17: 650 mg via ORAL
  Filled 2013-01-17: qty 2

## 2013-01-17 MED ORDER — ACETAMINOPHEN 325 MG PO TABS: 650.0000 mg | ORAL_TABLET | Freq: Four times a day (QID) | ORAL | Status: DC | PRN

## 2013-01-17 MED ORDER — HEPARIN SODIUM (PORCINE) 5000 UNIT/ML IJ SOLN: 5000.0000 [IU] | Freq: Three times a day (TID) | INTRAMUSCULAR | Status: DC

## 2013-01-17 MED ORDER — METRONIDAZOLE IN NACL 5-0.79 MG/ML-% IV SOLN
500.0000 mg | Freq: Two times a day (BID) | INTRAVENOUS | Status: DC
  Filled 2013-01-17 (×2): qty 100

## 2013-01-17 MED ORDER — ACETAMINOPHEN 325 MG PO TABS
650.0000 mg | ORAL_TABLET | Freq: Four times a day (QID) | ORAL | Status: DC | PRN
  Administered 2013-01-18: 650 mg via ORAL
  Filled 2013-01-17: qty 2

## 2013-01-17 MED ORDER — METRONIDAZOLE IN NACL 5-0.79 MG/ML-% IV SOLN
500.0000 mg | Freq: Three times a day (TID) | INTRAVENOUS | Status: DC
  Administered 2013-01-17 – 2013-01-18 (×2): 500 mg via INTRAVENOUS
  Filled 2013-01-17 (×4): qty 100

## 2013-01-17 NOTE — Progress Notes (Signed)
Internal Medicine Attending Admission Note Date: 01/17/2013  Patient name: [name] Medical record number: [id number] Date of birth: 26-Jan-1981 Age: 32 y.o. Gender: female  I saw and evaluated the patient, and discussed her care with house staff. I reviewed the note by resident Dr. Alice Rieger and I agree with the resident's findings and plan as documented in his note, with the following additional comments.  Chief Complaint(s): Facial and periorbital swelling  History - key components related to admission: Patient is a 32 year old woman with history of systemic lupus erythematosus, lupus nephritis, pericarditis with pericardial effusion and tamponade status post pericardiocentesis in December 2006, anemia, and other problems as outlined in the medical history admitted with a two-day history of progressive facial swelling that began after she bumped her forehead on a car door; the swelling began in her forehead area and has progressed to include her eyelids and upper cheeks.  She reports local tenderness in the area of the swelling, but denies headache.  She denies pain with eye movement.  She has not recently been on immunosuppressive medication.  She reports eating and drinking less recently.   Physical Exam - key components related to admission:  Filed Vitals:   01/17/13 1500 01/17/13 1700 01/17/13 1741 01/17/13 1814  BP: 112/73   119/76  Pulse: 121   105  Temp:    98.8 F (37.1 C)  TempSrc:    Oral  Resp:   27 20  Height:  5\' 4"  (1.626 m)    Weight:  120 lb (54.432 kg)    SpO2: 100%   100%   Temp (24hrs), Avg:100.2 F (37.9 C), Min:98.8 F (37.1 C), Max:101.8 F (38.8 C)   General: Alert HEENT: There is erythema and facial swelling of the patient's forehead, eyelids, and upper cheeks, with tenderness to palpation. Neck: Supple Lungs: Clear Heart: Regular; S1-S2, no S3, no S4, no murmurs Abdomen: Bowel sounds present, soft, nontender Extremities: No edema   Lab  results:   Basic Metabolic Panel:  Basename 01/17/13 0920  NA 129*  K 4.5  CL 100  CO2 16*  GLUCOSE 99  BUN 35*  CREATININE 2.18*  CALCIUM 7.8*  MG --  PHOS --   Liver Function Tests:  Basename 01/17/13 1500  AST 10  ALT 5  ALKPHOS 45  BILITOT 0.2*  PROT 6.5  ALBUMIN 1.2*    CBC:  Basename 01/17/13 0920  WBC 18.4*  NEUTROABS 16.0*  HGB 10.9*  HCT 32.6*  MCV 81.7  PLT 211    Fasting Lipid Panel:  Basename 01/17/13 1500  CHOL 103  HDL 30*  LDLCALC 52  TRIG 105  CHOLHDL 3.4  LDLDIRECT --    Urinalysis    Component Value Date/Time   COLORURINE YELLOW 01/17/2013 1058   APPEARANCEUR CLOUDY* 01/17/2013 1058   LABSPEC 1.017 01/17/2013 1058   PHURINE 5.5 01/17/2013 1058   GLUCOSEU NEGATIVE 01/17/2013 1058   HGBUR LARGE* 01/17/2013 1058   BILIRUBINUR NEGATIVE 01/17/2013 1058   KETONESUR NEGATIVE 01/17/2013 1058   PROTEINUR >300* 01/17/2013 1058   UROBILINOGEN 1.0 01/17/2013 1058   NITRITE NEGATIVE 01/17/2013 1058   LEUKOCYTESUR TRACE* 01/17/2013 1058    Urine microscopic: 11-20 WBCs, 11-20 RBCs, trichomonas present, few squamous epithelial cells, few bacteria, hyaline casts  Drugs of Abuse     Component Value Date/Time   LABOPIA NONE DETECTED 01/17/2013 1058   COCAINSCRNUR POSITIVE* 01/17/2013 1058   LABBENZ NONE DETECTED 01/17/2013 1058   AMPHETMU NONE DETECTED 01/17/2013 [place]  POSITIVE* 01/17/2013 1058   LABBARB NONE DETECTED 01/17/2013 1058     Imaging results:  Dg Chest 2 View  01/17/2013  *RADIOLOGY REPORT*  Clinical Data: Tachycardia  CHEST - 2 VIEW  Comparison: March 09, 2011  Findings:  Lungs clear.  Heart size and pulmonary vascularity are normal.  No adenopathy.  No bone lesions.  IMPRESSION: No abnormality noted.   Original Report Authenticated By: Lowella Grip, M.D.    US Renal  01/17/2013  *RADIOLOGY REPORT*  Clinical Data: Lupus.  Nephrotic syndrome.  RENAL/URINARY TRACT ULTRASOUND COMPLETE  Comparison:  12/27/2005  Findings:  Right  Kidney:  Mildly echogenic renal pyramids.  13.2 cm in length. No renal stones, mass, or hydronephrosis.  Left Kidney:  Mildly hyperechoic pyramids, as on the contralateral side.  13.2 cm in length.  Otherwise negative.  Bladder:  Empty during exam.  IMPRESSION: 1.  Slightly echogenic renal pyramids, suspicious for chronic glomerulonephritis.   Original Report Authenticated By: Van Clines, M.D.    Ct Maxillofacial Wo Cm  01/17/2013  *RADIOLOGY REPORT*  Clinical Data: Injury to the head with swelling in the eyes, fever.  CT MAXILLOFACIAL WITHOUT CONTRAST  Technique:  Multidetector CT imaging of the maxillofacial structures was performed. Multiplanar CT image reconstructions were also generated.  Comparison: None.  Findings: Forehead and periorbital soft tissue swelling tracks down into the subcutaneous tissues anterior to the maxilla bilaterally. Process appears bilaterally symmetric.  No postseptal extension into the orbits is identified.  The globes appear intact.  No fracture observed.  There is chronic right frontal and chronic right ethmoid sinusitis.  Mild chronic sphenoid sinusitis.  Cavity and periapical abscess associated with tooth #15.  Cavities of teeth number 29, 30, and 31, with associated periapical abscesses.  Ectopic tooth embedded in the right mandible as shown on image 18 of series 2, with a possible channel extending down to this ectopic tooth on image 26 of series 401.  IMPRESSION:  1.  Considerable soft tissue swelling tracking in the forehead subcutaneous tissues down into the periorbital regions and upper cheek regions, without underlying fracture. No intraorbital extension.  Infectious process such as cellulitis is not excluded. 2.  Chronic right paranasal sinusitis. 3.  Cavities and periapical abscesses in several teeth as detailed above. 4.  Ectopic tooth imbedded in the right mandible, with a possible channel extending to one of the decayed teeth in this vicinity. Dental referral  recommended.   Original Report Authenticated By: Van Clines, M.D.     Other results: EKG: Sinus rhythm; borderline T abnormality in the inferior leads  Assessment & Plan by Problem:  1.  Facial and preseptal cellulitis .  Patient presents with facial and preseptal cellulitis involving the forehead and eyelids; maxillofacial CT scan shows no post-septal extension into the orbits.  The cellulitis was apparently precipitated by a cut on her forehead that she sustained 2-3 days prior to admission when she bumped her forehead on the car door.  Plan is empiric IV antibiotic treatment with vancomycin, and would add gram-negative coverage with ceftriaxone or Zosyn; antibiotic should be adjusted by pharmacy for renal function.  2.  Acute renal insufficiency.  Patient has a history of lupus nephritis and has recently been on no immunosuppressive medications by her report.  Her last creatinine on 07/05/2012 was 1.20.  Her acute renal insufficiency may be due to volume depletion, lupus nephritis, or other process.  Plan is IV fluid; check FENa; follow renal function; nephrology consult.  3. Systemic lupus erythematosus  with history of lupus nephritis and pericarditis. As above, nephrology consult; 2-D echocardiogram; monitor renal function.    4. Trichomonas UTI.  Plan is treat with metronidazole.    5. Other problems as per resident physician's note.

## 2013-01-17 NOTE — Progress Notes (Addendum)
ANTIBIOTIC CONSULT NOTE - INITIAL  Pharmacy Consult for Vancomycin Indication: facial cellulitis  Allergies  Allergen Reactions  . Tobramycin Sulfate Swelling    Patient Measurements: Height: 5\' 4"  (162.6 cm) Weight: 120 lb (54.432 kg) IBW/kg (Calculated) : 54.7   Vital Signs: Temp: 101.8 F (38.8 C) (01/21 1304) Temp src: Oral (01/21 1304) BP: 121/74 mmHg (01/21 1304) Pulse Rate: 113  (01/21 1304) Intake/Output from previous day:   Intake/Output from this shift:    Labs:  Basename 01/17/13 0920  WBC 18.4*  HGB 10.9*  PLT 211  LABCREA --  CREATININE 2.18*   Estimated Creatinine Clearance: 32.7 ml/min (by C-G formula based on Cr of 2.18). No results found for this basename: VANCOTROUGH:2,VANCOPEAK:2,VANCORANDOM:2,GENTTROUGH:2,GENTPEAK:2,GENTRANDOM:2,TOBRATROUGH:2,TOBRAPEAK:2,TOBRARND:2,AMIKACINPEAK:2,AMIKACINTROU:2,AMIKACIN:2, in the last 72 hours   Microbiology: No results found for this or any previous visit (from the past 720 hour(s)).  Medical History: Past Medical History  Diagnosis Date  . Lupus   . Lupus (systemic lupus erythematosus) 01/17/2013    Medications:   (Not in a hospital admission) Assessment: 32 y/o female patient admitted with facial swelling requiring vancomycin for cellulitis.  Noted acute renal failure, will adjust abx.  Goal of Therapy:  Vancomycin trough level 10-15 mcg/ml  Plan:  Vancomycin 750mg  IV q12h and monitor renal function. Measure antibiotic drug levels at steady [address], PharmD, BCPS Pager [phone] Wyvonne Lenz 01/17/2013,5:23 PM  Addendum:   Given patients renal function ( Scr 1.78<1.91, CrCl ~ 40 mL/min), will change the vancomycin dose to 1000mg  IV q24h starting 1/23 at 0600. WBC 19.9<19.8, Tmax 101.8. Pt is also on cefepime and flagyl. We will assess renal function on 1/23 and make any necessary changes to the anti-biotic regimen.

## 2013-01-17 NOTE — ED Notes (Signed)
Pt c/o facial swelling. Pt bumped head Sat, continued to swell. This AM swelling to eyes. Limiting vision. Denies pain. Pt states" I think it is a lupus flair"

## 2013-01-17 NOTE — Progress Notes (Signed)
01/17/2013 patient transfer from emergency room to 6700 at 1800. she is alert, oriented and ambulatory. Patient have swelling in face and periorbital swelling. Patient stated she can see little out left eye and right eye is blurry.  On forehead patient have scab, when she stated she bump her head, heels dry, tattoos upper back, fingers on right hand, left wrist and on top of right foot. Patient also have stretch marks on abdomen. Medical laboratory scientific officer.

## 2013-01-17 NOTE — ED Provider Notes (Signed)
History     CSN: [id number]  Arrival date & time 01/17/13  [id number]   First MD Initiated Contact with Patient 01/17/13 [phone]      Chief Complaint  Patient presents with  . Facial Swelling     The history is provided by the patient.  onset - this morning Course - stable Worsened by - nothing Improved by - nothing  Pt presents for bilateral orbital swelling.  She reports she woke up with this.  No trauma to eyes,but she does report she "bumped" her forehead several days ago (no LOC, no headache, no vomiting, no visual changes) and then noted swelling this morning.  No new SOB.  No new rash.  No new meds or exposures.  She has h/o lupus but is not currently on meds.  She has never had this before.  No lip/tongue swelling.  No difficulty swallowing.   She does report recent vomiting/diarrhea this past weekend.  Past Medical History  Diagnosis Date  . Lupus     No past surgical history on file.  No family history on file.  History  Substance Use Topics  . Smoking status: Current Every Day Smoker  . Smokeless tobacco: Not on file  . Alcohol Use: Yes    OB History    Grav Para Term Preterm Abortions TAB SAB Ect Mult Living                  Review of Systems  Constitutional: Negative for fatigue.  Eyes: Negative for photophobia and visual disturbance.  Respiratory: Negative for shortness of breath.   Cardiovascular: Negative for chest pain.  Gastrointestinal: Positive for vomiting and diarrhea. Negative for abdominal pain.  Neurological: Negative for weakness.  Psychiatric/Behavioral: Negative for agitation.  All other systems reviewed and are negative.    Allergies  Tobramycin sulfate  Home Medications   Current Outpatient Rx  Name  Route  Sig  Dispense  Refill  . IBUPROFEN 200 MG PO TABS   Oral   Take 400 mg by mouth every 6 (six) hours as needed. For pain           BP 112/83  Pulse 100  Temp 100.1 F (37.8 C) (Oral)  SpO2 100% BP 113/68  Pulse 102   Temp 100.1 F (37.8 C) (Oral)  SpO2 100%   Physical Exam CONSTITUTIONAL: Well developed/well nourished HEAD AND FACE: Normocephalic/atraumatic. Mild swelling to forehead with tenderness but no crepitance or erythema/abscess.  She has small abrasion to top of forehead that is nontender EYES: EOMI/PERRL.  Bilateral periorbital edema without erythema.  She has some yellowish discharge to each eyelid.  Her globes appear intact without erythema.   ENMT: Mucous membranes moist. No tongue/lip swelling. Uvula midline.  No stridor.  Voice normal. NECK: supple no meningeal signs SPINE:entire spine nontender CV: S1/S2 noted, no murmurs/rubs/gallops noted LUNGS: Lungs are clear to auscultation bilaterally, no apparent distress ABDOMEN: soft, nontender, no rebound or guarding GU:no cva tenderness NEURO: Pt is awake/alert, moves all extremitiesx4 EXTREMITIES: pulses normal, full ROM, no rash noted SKIN: warm, color normal PSYCH: no abnormalities of mood noted  ED Course  Procedures    Labs Reviewed  BASIC METABOLIC PANEL  CBC WITH DIFFERENTIAL  9:56 AM Pt with bilateral periorbital edema that appears allergic in nature, no erythema or abscess formation.  Will treat with benadryl and reassess  12:14 PM Pt with renal failure/dehydration/proteuria.  Suspect this may be nephrotic syndrome D/w resident service will eva for admission Pt currently  stable  MDM  Nursing notes including past medical history and social history reviewed and considered in documentation Labs/vital reviewed and considered         Sharyon Cable, MD 01/17/13 1215

## 2013-01-17 NOTE — ED Notes (Signed)
Patient transported to X-ray 

## 2013-01-17 NOTE — ED Notes (Signed)
Patient transported to Ultrasound 

## 2013-01-17 NOTE — Progress Notes (Addendum)
ANTIBIOTIC CONSULT NOTE - FOLLOW UP  Pharmacy Consult for :  Add Rocphin to vancomycin and Flagyl Indication: facial/preseptal cellulitis  Allergies  Allergen Reactions  . Tobramycin Sulfate Swelling    Patient Measurements: Height: 5\' 4"  (162.6 cm) Weight: 120 lb (54.432 kg) IBW/kg (Calculated) : 54.7    Vital Signs: Temp: 98.8 F (37.1 C) (01/21 1814) Temp src: Oral (01/21 1814) BP: 119/76 mmHg (01/21 1814) Pulse Rate: 105  (01/21 1814) Intake/Output from previous day:   Intake/Output from this shift:    Labs:  Basename 01/17/13 0920  WBC 18.4*  HGB 10.9*  PLT 211  LABCREA --  CREATININE 2.18*   Estimated Creatinine Clearance: 32.7 ml/min (by C-G formula based on Cr of 2.18). No results found for this basename: VANCOTROUGH:2,VANCOPEAK:2,VANCORANDOM:2,GENTTROUGH:2,GENTPEAK:2,GENTRANDOM:2,TOBRATROUGH:2,TOBRAPEAK:2,TOBRARND:2,AMIKACINPEAK:2,AMIKACINTROU:2,AMIKACIN:2, in the last 72 hours   Microbiology: No results found for this or any previous visit (from the past 720 hour(s)).   Assessment: [name] is a 32 yo F with B orbital swelling started on vancomycin and Flagyl in the ED.  Now to add ceftriaxone. She is in acute renal failure but ceftriaxone does not need to be renally adjusted.  Goal of Therapy: eradication of infection  Plan:  1. Ceftriaxone 1 gm IV q24 2. Her renal function is not poor enough for q12h Flagyl, so I have increased Flagyl to 500mg  IV q8h 3. F/u culture data Eudelia Bunch, Pharm.D. [id number] 01/17/2013 6:22 PM

## 2013-01-17 NOTE — H&P (Signed)
See attending admission note.

## 2013-01-17 NOTE — H&P (Signed)
Hospital Admission Note Date: 01/17/2013  Patient name: [name] Medical record number: [id number] Date of birth: 27-Oct-1981 Age: 32 y.o. Gender: female PCP: Default, Provider, MD  Medical Service: Internal medicine teaching service  Attending physician: Dr. Bertha Stakes   1st Contact: Dr. Alice Rieger   Pager: [phone]  2nd Contact: Dr. Michail Sermon   [id number]    After 5 pm or weekends:  1st Contact: Pager: [phone]  2nd Contact: Pager: [phone]   Chief Complaint: Facial pain and swelling for 2 days.  History of Present Illness: This is a 32 year old woman, with a past medical history of lupus, who presents with facial pain and swelling for 2 days. The patient reports history of trauma prior to onset of the pain. She hit her head against the car door accidentally on 1/17. She used ibuprofen to treat the pain following the trauma but progressively her face became swelling and very painful starting one day prior to presentation to the ED. She also reports history of fevers and chills associated with this facial pain mainly involving the nose, and both eyes. She reports increased discharge from her eyes. She denies insect bites or close contact with sick people. She does not have a pet in her house. The pain is constant, dull and very severe to a scale of 8-10/10. There are no relieving or aggravating factors she has noticed.  She denies history of shortness of breath. She denies sore throat or epistaxis. However, she has increasingly purulent discharge from her eyes bilaterally.  Meds: Current Outpatient Rx  Name  Route  Sig  Dispense  Refill  . IBUPROFEN 200 MG PO TABS   Oral   Take 400 mg by mouth every 6 (six) hours as needed. For pain           Allergies: Allergies as of 01/17/2013 - Review Complete 01/17/2013  Allergen Reaction Noted  . Tobramycin sulfate Swelling 07/05/2012   Past Medical History  Diagnosis Date  . Lupus    No past surgical history on file. No  family history on file. History   Social History  . Marital Status: Single    Spouse Name: N/A    Number of Children: N/A  . Years of Education: N/A   Occupational History  . Not on file.   Social History Main Topics  . Smoking status: Current Every Day Smoker  . Smokeless tobacco: Not on file  . Alcohol Use: Yes  . Drug Use: Yes    Special: Marijuana  . Sexually Active: Yes   Other Topics Concern  . Not on file   Social History Narrative  . No narrative on file    Review of Systems: Constitutional: positive for anorexia, chills, fatigue, fevers, malaise, sweats and weight loss Eyes: positive for cataracts, color blindness and glaucoma, negative for irritation, redness and no photophobia  Ears, nose, mouth, throat, and face: positive for nasal congestion, negative for ear drainage, earaches, hearing loss, hoarseness, sore mouth, sore throat, tinnitus and voice change, all started after trauma Respiratory: negative Cardiovascular: negative Gastrointestinal: positive for vomiting Genitourinary:negative Musculoskeletal:negative Neurological: negative for dizziness, gait problems, seizures, speech problems and vertigo Allergic/Immunologic: negative  Physical Exam: Blood pressure 121/74, pulse 113, temperature 101.8 F (38.8 C), temperature source Oral, resp. rate 20, last menstrual period 01/17/2013, SpO2 100.00%. General appearance: alert, cooperative, flushed, moderate distress, toxic and in pain with both eyes covered and appears to be in pain. Head: Normocephalic, without obvious abnormality, atraumatic, small areas on bruising over the frontal  head - small less than 0.5cm in diameter at the right side. Eyes: positive findings: eyelids/periorbital: swollen bilaterally with discharge of thin fluid. She also has erythema, involvinng the nose and frotal head. No areas of fractuancy, no localized open wounds. Ears: normal TM's and external ear canals both ears Nose: Nares  normal. Septum midline. Mucosa normal. No drainage or sinus tenderness., erythema and tenderness over the nose bridge with some erythema. Throat: lips, mucosa, and tongue normal; teeth and gums normal Neck: no adenopathy, no carotid bruit, no JVD, supple, symmetrical, trachea midline, thyroid not enlarged, symmetric, no tenderness/mass/nodules and no neck stiffness. Back: symmetric, no curvature. ROM normal. No CVA tenderness. Lungs: clear to auscultation bilaterally Heart: regular rate and rhythm, S1, S2 normal, no murmur, click, rub or gallop Abdomen: soft, non-tender; bowel sounds normal; no masses,  no organomegaly Extremities: extremities normal, atraumatic, no cyanosis or edema Pulses: 2+ and symmetric Skin: Skin color, texture, turgor normal. No rashes or lesions Neurologic: Alert and oriented X 3, normal strength and tone. Normal symmetric reflexes. Normal coordination and gait  Lab results: Basic Metabolic Panel:  Basename 01/17/13 0920  NA 129*  K 4.5  CL 100  CO2 16*  GLUCOSE 99  BUN 35*  CREATININE 2.18*  CALCIUM 7.8*  MG --  PHOS --   CBC:  Basename 01/17/13 0920  WBC 18.4*  NEUTROABS 16.0*  HGB 10.9*  HCT 32.6*  MCV 81.7  PLT 211   Urinalysis:  Basename 01/17/13 1058  COLORURINE YELLOW  LABSPEC 1.017  PHURINE 5.5  GLUCOSEU NEGATIVE  HGBUR LARGE*  BILIRUBINUR NEGATIVE  KETONESUR NEGATIVE  PROTEINUR >300*  UROBILINOGEN 1.0  NITRITE NEGATIVE  LEUKOCYTESUR TRACE*   Misc. Labs:   Imaging results:  Dg Chest 2 View  01/17/2013  *RADIOLOGY REPORT*  Clinical Data: Tachycardia  CHEST - 2 VIEW  Comparison: March 09, 2011  Findings:  Lungs clear.  Heart size and pulmonary vascularity are normal.  No adenopathy.  No bone lesions.  IMPRESSION: No abnormality noted.   Original Report Authenticated By: Lowella Grip, M.D.    US Renal  01/17/2013  *RADIOLOGY REPORT*  Clinical Data: Lupus.  Nephrotic syndrome.  RENAL/URINARY TRACT ULTRASOUND COMPLETE   Comparison:  12/27/2005  Findings:  Right Kidney:  Mildly echogenic renal pyramids.  13.2 cm in length. No renal stones, mass, or hydronephrosis.  Left Kidney:  Mildly hyperechoic pyramids, as on the contralateral side.  13.2 cm in length.  Otherwise negative.  Bladder:  Empty during exam.  IMPRESSION: 1.  Slightly echogenic renal pyramids, suspicious for chronic glomerulonephritis.   Original Report Authenticated By: Van Clines, M.D.     Other results: EKG:   Assessment & Plan by Problem: This is a 32 year old woman, with a past medical history of lupus, who presents with facial pain and swelling for 2 days.   1. Preseptal/Facial cellulitis: Patient's presentation with a history of increasing pain and swelling involving to the face is very concerning for preseptal/facial cellulitis. She also endorses fevers and chills. Examination reveals small marks of previous trauma, moderate swelling, and erythema surrounding the frontal head, bilateral orbital swelling, and signs of infection surrounding her nasal bridge. Her temperature in the ED, was 101.8 The CBC revealed a white blood cell count of 18.4. CT scan reports infectious process with chronic and acute sinusitis. Other possibilities like allergies and unlikely in the setting of a clear history of trauma over the face, preceding the pain and swelling. Plan  - Admit to medical surgical bed. -  Blood cultures before antibiotics with Vanc, ceftriaxone and Metronidazole. -Discontinued the ibuprofen  -Solumedrol to control the swelling and pain. 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 He had a biopsy done in 2006, which confirmed this diagnosis. However, she reports, that she has not been following up with the nephrologist. She has a history of pericardial effusion, which was drained by Dr. Johnsie Cancel about 2 years ago. She reports that she was on mycophenolate, but she has been off for over one  year. Her urine exam reveals proteinuria of more than 300 with hyaline casts. However, on physical examination, she does not have edema and I do not suspect nephrotic syndrome to account for facial swelling. Creatinine is 2.18. Her baseline symptoms between 1.6-1.2. Repeat renal ultrasound performed today reveals Slightly echogenic renal pyramids, suspicious for chronic Glomerulonephritis. The previous one performed in 2006 revealed enlarged bilateral kidneys or bilateral nephromegaly. She also reports that she has been using Ibuprofen which can cause AKI.   Plan  - Consult renal and d/c ibuprofen   - She'll need close followup with a nephrologist upon discharge. This increase in her creatinine could represent a worsening of her lupus nephritis or acute renal insufficiency secondary to acute infection. -Will start her on IV fluids - Monitor renal function -repeat renal ultrasound  -Echocardiogram given the history of pericardial effusion  3. Trichomonas vaginalis infection: On urine exam she patient was found to have Trichomonas vaginal infection. She denies symptoms of vaginal itching.  Plan. -Metronidazole for treatment of this Trichomonas. -Will screen the patient for other sexually transmitted infections, including gonorrhea and HIV  Dispo: Disposition is deferred at this time, awaiting improvement of current medical problems. Anticipated discharge in approximately 2-4 day(s).   The patient does not have a current PCP (Default, Provider, MD), therefore will be requiring OPC follow-up after  discharge.   The patient does not have transportation limitations that hinder transportation to clinic appointments.  Signed: Jessee Avers 01/17/2013, 3:57 PM

## 2013-01-17 NOTE — ED Notes (Addendum)
Pt states unable to see out of right eye due to swelling

## 2013-01-18 ENCOUNTER — Encounter (HOSPITAL_COMMUNITY): Payer: Self-pay | Admitting: Sports Medicine

## 2013-01-18 DIAGNOSIS — F1410 Cocaine abuse, uncomplicated: Secondary | ICD-10-CM | POA: Diagnosis present

## 2013-01-18 DIAGNOSIS — F1290 Cannabis use, unspecified, uncomplicated: Secondary | ICD-10-CM | POA: Diagnosis present

## 2013-01-18 DIAGNOSIS — R0609 Other forms of dyspnea: Secondary | ICD-10-CM

## 2013-01-18 DIAGNOSIS — R0989 Other specified symptoms and signs involving the circulatory and respiratory systems: Secondary | ICD-10-CM

## 2013-01-18 LAB — GC/CHLAMYDIA PROBE AMP: GC Probe RNA: NEGATIVE

## 2013-01-18 LAB — CBC
HCT: 28.9 % — ABNORMAL LOW (ref 36.0–46.0)
RBC: 3.53 MIL/uL — ABNORMAL LOW (ref 3.87–5.11)
RDW: 14.2 % (ref 11.5–15.5)
WBC: 19.9 10*3/uL — ABNORMAL HIGH (ref 4.0–10.5)

## 2013-01-18 LAB — BASIC METABOLIC PANEL
Chloride: 110 mEq/L (ref 96–112)
Creatinine, Ser: 1.78 mg/dL — ABNORMAL HIGH (ref 0.50–1.10)
GFR calc Af Amer: 43 mL/min — ABNORMAL LOW (ref >90)
Potassium: 4.3 mEq/L (ref 3.5–5.1)
Sodium: 136 mEq/L (ref 135–145)

## 2013-01-18 LAB — HEPATITIS PANEL, ACUTE
HCV Ab: NEGATIVE
Hep A IgM: NEGATIVE
Hepatitis B Surface Ag: NEGATIVE

## 2013-01-18 LAB — ANA: Anti Nuclear Antibody(ANA): POSITIVE — AB

## 2013-01-18 LAB — URINE CULTURE

## 2013-01-18 MED ORDER — METRONIDAZOLE 500 MG PO TABS
500.0000 mg | ORAL_TABLET | Freq: Two times a day (BID) | ORAL | Status: DC
  Administered 2013-01-18 – 2013-01-19 (×2): 500 mg via ORAL
  Filled 2013-01-18 (×3): qty 1

## 2013-01-18 MED ORDER — SODIUM CHLORIDE 0.9 % IV SOLN
INTRAVENOUS | Status: AC
  Administered 2013-01-18: 14:00:00 via INTRAVENOUS

## 2013-01-18 MED ORDER — SODIUM BICARBONATE 650 MG PO TABS
650.0000 mg | ORAL_TABLET | Freq: Two times a day (BID) | ORAL | Status: DC
  Administered 2013-01-18 – 2013-01-21 (×7): 650 mg via ORAL
  Filled 2013-01-18 (×9): qty 1

## 2013-01-18 MED ORDER — VANCOMYCIN HCL IN DEXTROSE 1-5 GM/200ML-% IV SOLN
1000.0000 mg | INTRAVENOUS | Status: DC
  Administered 2013-01-19 – 2013-01-21 (×3): 1000 mg via INTRAVENOUS
  Filled 2013-01-18 (×4): qty 200

## 2013-01-18 NOTE — Progress Notes (Signed)
Subjective: She feels better today. The pain is less. No overnight events.  Objective: Vital signs in last 24 hours: Filed Vitals:   01/17/13 2154 01/18/13 0454 01/18/13 0902 01/18/13 1431  BP: 111/73 111/80 122/77 148/65  Pulse: 100 88 86 87  Temp: 98.6 F (37 C) 98.1 F (36.7 C) 98.1 F (36.7 C) 98.1 F (36.7 C)  TempSrc: Oral Oral Oral Oral  Resp: 18 18 18 18   Height:      Weight:      SpO2: 100% 95% 99% 99%   Weight change:   Intake/Output Summary (Last 24 hours) at 01/18/13 1652 Last data filed at 01/18/13 1610  Gross per 24 hour  Intake   3525 ml  Output    350 ml  Net   3175 ml   General appearance: alert, cooperative, fatigued, mild distress and toxic Head: small bruises on left frontal area. Eyes: The periorbital edema is reduced today. she has good vision in both eyes. There are normal EOM. The discharge from the eyes is less. PEARL Neck: no adenopathy, no carotid bruit, no JVD, supple, symmetrical, trachea midline and thyroid not enlarged, symmetric, no tenderness/mass/nodules Lungs: clear to auscultation bilaterally Heart: regular rate and rhythm, S1, S2 normal, no murmur, click, rub or gallop Abdomen: soft, non-tender; bowel sounds normal; no masses,  no organomegaly Extremities: extremities normal, atraumatic, no cyanosis or edema Pulses: 2+ and symmetric Neurologic: Alert and oriented X 3, normal strength and tone. Normal symmetric reflexes. Normal coordination and gait Lab Results: Basic Metabolic Panel:  Lab [id number] 01/17/13 2033 01/17/13 0920  NA 136 -- 129*  K 4.3 -- 4.5  CL 110 -- 100  CO2 16* -- 16*  GLUCOSE 103* -- 99  BUN 37* -- 35*  CREATININE 1.78* 1.91* --  CALCIUM 7.0* -- 7.8*  MG -- -- --  PHOS -- -- --   Liver Function Tests:  Lab 01/17/13 1500  AST 10  ALT 5  ALKPHOS 45  BILITOT 0.2*  PROT 6.5  ALBUMIN 1.2*   CBC:  Lab 01/18/13 0445 01/17/13 2033 01/17/13 0920  WBC 19.9* 19.8* --  NEUTROABS -- -- 16.0*  HGB 9.5*  10.0* --  HCT 28.9* 29.5* --  MCV 81.9 81.7 --  PLT 222 223 --   Fasting Lipid Panel:  Lab 01/17/13 1500  CHOL 103  HDL 30*  LDLCALC 52  TRIG 105  CHOLHDL 3.4  LDLDIRECT --    Drugs of Abuse     Component Value Date/Time   LABOPIA NONE DETECTED 01/17/2013 1058   COCAINSCRNUR POSITIVE* 01/17/2013 1058   LABBENZ NONE DETECTED 01/17/2013 1058   AMPHETMU NONE DETECTED 01/17/2013 1058   THCU POSITIVE* 01/17/2013 1058   LABBARB NONE DETECTED 01/17/2013 1058    Urinalysis:  Lab 01/17/13 1058  COLORURINE YELLOW  LABSPEC 1.017  PHURINE 5.5  GLUCOSEU NEGATIVE  HGBUR LARGE*  BILIRUBINUR NEGATIVE  KETONESUR NEGATIVE  PROTEINUR >300*  UROBILINOGEN 1.0  NITRITE NEGATIVE  LEUKOCYTESUR TRACE*   Misc. Labs:   Micro Results: Recent Results (from the past 240 hour(s))  URINE CULTURE     Status: Normal   Collection Time   01/17/13 10:58 AM      Component Value Range Status Comment   Specimen Description URINE, RANDOM   Final    Special Requests NONE   Final    Culture  Setup Time 01/17/2013 12:27   Final    Colony Count NO GROWTH   Final    Culture NO GROWTH  Final    Report Status 01/18/2013 FINAL   Final   CULTURE, BLOOD (ROUTINE X 2)     Status: Normal (Preliminary result)   Collection Time   01/17/13  3:00 PM      Component Value Range Status Comment   Specimen Description BLOOD RIGHT ARM   Final    Special Requests BOTTLES DRAWN AEROBIC AND ANAEROBIC 10CC   Final    Culture  Setup Time 01/17/2013 21:08   Final    Culture     Final    Value: GRAM POSITIVE COCCI IN PAIRS     Note: Gram Stain Report Called to,Read Back By and Verified With: Kyera TONEY 01/18/13 1110 BY [email or web address]   Report Status PENDING   Incomplete   CULTURE, BLOOD (ROUTINE X 2)     Status: Normal (Preliminary result)   Collection Time   01/17/13  3:20 PM      Component Value Range Status Comment   Specimen Description BLOOD RIGHT HAND   Final    Special Requests BOTTLES DRAWN AEROBIC AND ANAEROBIC 10CC    Final    Culture  Setup Time 01/17/2013 21:08   Final    Culture     Final    Value: GRAM POSITIVE COCCI IN PAIRS     Note: Gram Stain Report Called to,Read Back By and Verified With: Mida TONEY 01/18/13 1110 BY [email or web address]   Report Status PENDING   Incomplete    Studies/Results: Dg Chest 2 View  01/17/2013  *RADIOLOGY REPORT*  Clinical Data: Tachycardia  CHEST - 2 VIEW  Comparison: March 09, 2011  Findings:  Lungs clear.  Heart size and pulmonary vascularity are normal.  No adenopathy.  No bone lesions.  IMPRESSION: No abnormality noted.   Original Report Authenticated By: Lowella Grip, M.D.    US Renal  01/17/2013  *RADIOLOGY REPORT*  Clinical Data: Lupus.  Nephrotic syndrome.  RENAL/URINARY TRACT ULTRASOUND COMPLETE  Comparison:  12/27/2005  Findings:  Right Kidney:  Mildly echogenic renal pyramids.  13.2 cm in length. No renal stones, mass, or hydronephrosis.  Left Kidney:  Mildly hyperechoic pyramids, as on the contralateral side.  13.2 cm in length.  Otherwise negative.  Bladder:  Empty during exam.  IMPRESSION: 1.  Slightly echogenic renal pyramids, suspicious for chronic glomerulonephritis.   Original Report Authenticated By: Van Clines, M.D.    Ct Maxillofacial Wo Cm  01/17/2013  *RADIOLOGY REPORT*  Clinical Data: Injury to the head with swelling in the eyes, fever.  CT MAXILLOFACIAL WITHOUT CONTRAST  Technique:  Multidetector CT imaging of the maxillofacial structures was performed. Multiplanar CT image reconstructions were also generated.  Comparison: None.  Findings: Forehead and periorbital soft tissue swelling tracks down into the subcutaneous tissues anterior to the maxilla bilaterally. Process appears bilaterally symmetric.  No postseptal extension into the orbits is identified.  The globes appear intact.  No fracture observed.  There is chronic right frontal and chronic right ethmoid sinusitis.  Mild chronic sphenoid sinusitis.  Cavity and periapical abscess associated with  tooth #15.  Cavities of teeth number 29, 30, and 31, with associated periapical abscesses.  Ectopic tooth embedded in the right mandible as shown on image 18 of series 2, with a possible channel extending down to this ectopic tooth on image 26 of series 401.  IMPRESSION:  1.  Considerable soft tissue swelling tracking in the forehead subcutaneous tissues down into the periorbital regions and upper cheek regions, without underlying fracture. No intraorbital extension.  Infectious process  such as cellulitis is not excluded. 2.  Chronic right paranasal sinusitis. 3.  Cavities and periapical abscesses in several teeth as detailed above. 4.  Ectopic tooth imbedded in the right mandible, with a possible channel extending to one of the decayed teeth in this vicinity. Dental referral recommended.   Original Report Authenticated By: Van Clines, M.D.    Medications: I have reviewed the patient's current medications. Scheduled Meds:   . cefTRIAXone (ROCEPHIN)  IV  1 g Intravenous Q24H  . heparin  5,000 Units Subcutaneous Q8H  . metroNIDAZOLE  500 mg Oral Q12H  . sodium bicarbonate  650 mg Oral BID  . sodium chloride  1,000 mL Intravenous Once  . vancomycin  1,000 mg Intravenous Q24H   Continuous Infusions:  PRN Meds:.acetaminophen, acetaminophen, ondansetron (ZOFRAN) IV, ondansetron Assessment/Plan:  This is a 32 year old woman, with a past medical history of lupus, who presents with facial pain and swelling for 2 days.   Preseptal/Facial cellulitis: Patient's presentation with a history of increasing pain and swelling involving to the face is very concerning for preseptal/facial cellulitis. She also endorses fevers and chills. Examination reveals small marks of previous trauma, moderate swelling, and erythema surrounding the frontal head, bilateral orbital swelling, and signs of infection surrounding her nasal bridge. Her temperature in the ED, was 101.8 The CBC revealed a white blood cell count of  18.4. She continues to have hight WBC at 19.9. CT scan reports infectious process with chronic and acute sinusitis. Other possibilities like allergies and unlikely in the setting of a clear history of trauma over the face, preceding the pain and swelling.   Plan   - Vanc and ceftriaxone. This is day 2 of treatment  -Discontinued the ibuprofen  -Solumedrol to control the swelling and pain.  -we will trend the CBC, still elevated but clinically better  Bacteremia: Blood cultures have revealed gram positive cocci in pairs. Sensitivity is pending. The infection is most likely is resulting from the facial infection above. No murmur heard to suggest Infective endocarditis. Echo on 01/18/2013- Normal LV size and systolic function, EF 0000000. Normal RV size and systolic function. No significant valvularabnormalities. Mild LAE.  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 He had a biopsy done in 2006, which confirmed this diagnosis. However, she reports, that she has not been following up with the nephrologist. She has a history of pericardial effusion, which was drained by Dr. Johnsie Cancel about 2 years ago. She reports that she was on mycophenolate, but she has been off for over one year. Her urine exam reveals proteinuria of more than 300 with hyaline casts. However, on physical examination, she does not have edema and I do not suspect nephrotic syndrome to account for facial swelling. Creatinine is 2.18. Her baseline symptoms between 1.6-1.2. Repeat renal ultrasound performed today reveals Slightly echogenic renal pyramids, suspicious for chronic Glomerulonephritis. The previous one performed in 2006 revealed enlarged bilateral kidneys or bilateral nephromegaly. She also reports that she has been using Ibuprofen which can cause AKI.  Plan  - appreciate renal input    - ordered extractable Nuclear antibodies, 24 hour urine protein, urine creatinine clearance and Creatinine protein ratio -started on sodium bicarb - She'll need close followup with a nephrologist upon discharge. ufficiency secondary to acute infection.  -Will start her on IV fluids  - Monitor renal function. Cr  already trending down from 2.18 to 1.78   Trichomonas vaginalis infection: On urine exam she patient was found to have Trichomonas vaginal infection. She denies symptoms of vaginal itching.  Plan.  -Metronidazole for treatment of this Trichomonas.  -Will screen the patient for other sexually transmitted infections, including gonorrhea and HIV   Drug abuse: UDS positive for cocaine and marijuana. She will need CSW for counseling about abuse.  Dispo: Disposition is deferred at this time, awaiting improvement of current medical problems. Anticipated discharge in approximately 2-4 day(s).  The patient does not have a current PCP (Default, Provider, MD), therefore will be requiring OPC follow-up after discharge.  The patient does not have transportation limitations that hinder transportation to clinic appointments.    LOS: 1 day   Jessee Avers 01/18/2013, 4:52 PM

## 2013-01-18 NOTE — Progress Notes (Signed)
PHARMACIST - PHYSICIAN COMMUNICATION DR:   Marinda Elk CONCERNING: Antibiotic IV to Oral Route Change Policy  RECOMMENDATION: This patient is receiving metronidazole by the intravenous route.  Based on criteria approved by the Pharmacy and Therapeutics Committee, the antibiotic(s) is/are being converted to the equivalent oral dose form(s).   DESCRIPTION: These criteria include:  Patient being treated for a respiratory tract infection, urinary tract infection, or cellulitis  The patient is not neutropenic and does not exhibit a GI malabsorption state  The patient is eating (either orally or via tube) and/or has been taking other orally administered medications for a least 24 hours  The patient is improving clinically and has a Tmax < 100.5  If you have questions about this conversion, please contact the Pharmacy Department  []   [phone] )  [facility] [x]   [phone] )  [facility]  []   [phone] )  [facility] []   [phone] )  [place], PharmD, BCPS Clinical Pharmacist Pager [phone]

## 2013-01-18 NOTE — Progress Notes (Signed)
Utilization review completed.  

## 2013-01-18 NOTE — Consult Note (Signed)
[facility] Consult Note    Patient name: [name] Medical record number: [id number] Date of birth: January 28, 1981 Age: 32 y.o. Gender: female  Primary Care Provider: Default, Provider, MD Primary OP Nephrologist: [name] (last seen 2008) Primary Hospital Service: IM Teaching Service Service Requesting Consult: same   Chief Complaint: Worsening Kidney Function    HPI  [name] is a 32 y.o. year old female who presented on 1/21 with a 2 day history of facial pain and swelling.  Admitted by IM Teaching Service with acute Preseptal/Facial Cellulitis.  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 She has followed Dr. Marval [name] previously but was last seen in 2008 and was then lost to follow up.  She has not been on immunosuppressive medications recently but was on CellCept previously.  Baseline testing in 2008 showed Proteinuria (2515mg /24) with Cr of 0.5mg /dL  Her creatinine has been trending upwards with a Cr of 2.18 at time of admission.  She was recently seen in the ED for influenza and had a Cr of 1.20 at that time.  Has also been lost to follow up for Cardiology (Dr. Johnsie [name]) and Rheumatology since she has been moving frequently.  [place] between [place], [place] and [place].  ROS   Constitutional No worsening fatigue, no generalized edema; occasional orthostatic symptoms ~q21month  Infectious Preseptal Cellulitis;  Prior to acute infection denies any fevers or chills over past 6 months  Resp Occasional cough and shortness of breath occuring ~1X monthly  Cardiac No chest pain, palpitations, orthopnea  GI No N/V, constipation, some loose stools since starting ABX  GU No hesitance, + frequency, + nocturia, + polyuria  Skin No rashes  Psych No reported mood disturbances  Neuro No focal deficits, no radicular sx  MSK Reports intermittent joint  pain.  Trauma None reported             HISTORY:  PMHx:  Past Medical History  Diagnosis Date  . Lupus (systemic lupus erythematosus)   . Lupus nephritis   . S/P pericardiocentesis 01/17/2013    H/o pericardial effusion with tamponade 2006   . H/O pleural effusion 01/17/2013  . H/O pericarditis 01/17/2013  . Cocaine abuse 01/18/2013    PSHx: No past surgical history on file.  Social Hx: History   Social History  . Marital Status: Single    Spouse Name: N/A    Number of Children: N/A  . Years of Education: N/A   Social History Main Topics  . Smoking status: Current Every Day Smoker  . Smokeless tobacco: Not on file  . Alcohol Use: Yes  . Drug Use: 5 per week    Special: Marijuana, Cocaine     Comment: Cocaine use "only on holidays"  . Sexually Active: Yes   Other Topics Concern  . Not on file   Social History Narrative  . No narrative on file   Family Hx: No family history on file.  Allergies: Allergies  Allergen Reactions  . Tobramycin Sulfate Swelling    Home Medications: Prescriptions prior to admission  Medication Sig Dispense Refill  . ibuprofen (ADVIL,MOTRIN) 200 MG tablet Take 400 mg by mouth every 6 (six) hours as needed. For pain                  OBJECTIVE  Vitals: Temp:  [98.1 F (36.7 C)-98.8 F (37.1 C)] 98.1 F (36.7 C) (01/22 0902) Pulse  Rate:  [86-121] 86  (01/22 0902) Resp:  [18-27] 18  (01/22 0902) BP: (111-122)/(73-80) 122/77 mmHg (01/22 0902) SpO2:  [95 %-100 %] 99 % (01/22 0902) Weight:  [120 lb (54.432 kg)] 120 lb (54.432 kg) (01/21 1700)  Weight: Wt Readings from Last 3 Encounters:  01/17/13 120 lb (54.432 kg)    I&Os: Yesterday: 01/21 0701 - 01/22 0700 In: 1860 [P.O.:240; I.V.:1220; IV Piggyback:400] Out: -  This shift: Total I/O In: 240 [P.O.:240] Out: -    PE: GENERAL:  Adult AA thin female.  Examined in 6700.  In no discomfort; norespiratory distress.   PSYCH: Alert and appropriately interactive; Insight:Good   Alert, oriented, thought content appropriate H&N: Markedly swollen and edematous face & B orbits THORAX: HEART: RRR, S1/S2 heard, no murmur LUNGS: CTA B, no wheezes, no crackles ABDOMEN:  +BS, soft, non-tender, no rigidity, no guarding, no masses/organomegaly EXTREMITIES: Moves all 4 extremities spontaneously, warm well perfused, no edema, bilateral DP and PT pulses 2+/4.    LABS  Lab 01/18/13 0445 01/17/13 2033 01/17/13 1500 01/17/13 0920  NA 136 -- -- 129*  K 4.3 -- -- 4.5  CL 110 -- -- 100  CO2 16* -- -- 16*  BUN 37* -- -- 35*  CREATININE 1.78* 1.91* -- 2.18*  CALCIUM 7.0* -- -- 7.8*  GLUCOSE 103* -- -- 99  MG -- -- -- --  PHOS -- -- -- --  PROT -- -- 6.5 --  ALBUMIN -- -- 1.2* --    Lab 01/18/13 0445 01/17/13 2033 01/17/13 0920  WBC 19.9* 19.8* 18.4*  HGB 9.5* 10.0* 10.9*  HCT 28.9* 29.5* 32.6*  PLT 222 223 211  RDW 14.2 13.7 13.7  MCV 81.9 81.7 81.7  MCHC 32.9 33.9 33.4    Lab 01/17/13 1500  ALT 5  AST 10  ALKPHOS 45  BILITOT 0.2*  BILIDIR <0.1   No results found for this basename: AMMONIA:3,URICACID:3 in the last 8760 hours No results found for this basename: PTH:3 in the last 8760 hours  Urine Studies  Basename 01/17/13 1058 07/05/12 1924  COLORURINE YELLOW YELLOW  APPEARANCEUR CLOUDY* CLOUDY*  LABSPEC 1.017 1.018  PHURINE 5.5 6.0  GLUCOSEU NEGATIVE NEGATIVE  HGBUR LARGE* MODERATE*  BILIRUBINUR NEGATIVE NEGATIVE  KETONESUR NEGATIVE NEGATIVE  PROTEINUR >300* >300*  UROBILINOGEN 1.0 0.2  NITRITE NEGATIVE POSITIVE*  LEUKOCYTESUR TRACE* SMALL*   Results for [name], [name] (MRN [id number]) as of 01/18/2013 13:44  Ref. Range 01/17/2013 15:00  ANA Latest Range: NEGATIVE  POSITIVE (A)  RPR Latest Range: NON REACTIVE  NON REACTIVE  HIV Latest Range: NON REACTIVE  NON REACTIVE    MICRO: 01/17/13 - Urine Culture - No growth 01/17/13 - Blood Cx X 2 - Gram Positive Cocci in Pairs   IMAGING: Dg Chest 2 View  01/17/2013  *RADIOLOGY REPORT*  Clinical  Data: Tachycardia  CHEST - 2 VIEW  Comparison: March 09, 2011  Findings:  Lungs clear.  Heart size and pulmonary vascularity are normal.  No adenopathy.  No bone lesions.  IMPRESSION: No abnormality noted.   Original Report Authenticated By: Lowella Grip, M.D.    US Renal  01/17/2013  *RADIOLOGY REPORT*  Clinical Data: Lupus.  Nephrotic syndrome.  RENAL/URINARY TRACT ULTRASOUND COMPLETE  Comparison:  12/27/2005  Findings:  Right Kidney:  Mildly echogenic renal pyramids.  13.2 cm in length. No renal stones, mass, or hydronephrosis.  Left Kidney:  Mildly hyperechoic pyramids, as on the contralateral side.  13.2 cm in length.  Otherwise negative.  Bladder:  Empty during exam.  IMPRESSION: 1.  Slightly echogenic renal pyramids, suspicious for chronic glomerulonephritis.   Original Report Authenticated By: Van Clines, M.D.    Ct Maxillofacial Wo Cm  01/17/2013  *RADIOLOGY REPORT*  Clinical Data: Injury to the head with swelling in the eyes, fever.  CT MAXILLOFACIAL WITHOUT CONTRAST  Technique:  Multidetector CT imaging of the maxillofacial structures was performed. Multiplanar CT image reconstructions were also generated.  Comparison: None.  Findings: Forehead and periorbital soft tissue swelling tracks down into the subcutaneous tissues anterior to the maxilla bilaterally. Process appears bilaterally symmetric.  No postseptal extension into the orbits is identified.  The globes appear intact.  No fracture observed.  There is chronic right frontal and chronic right ethmoid sinusitis.  Mild chronic sphenoid sinusitis.  Cavity and periapical abscess associated with tooth #15.  Cavities of teeth number 29, 30, and 31, with associated periapical abscesses.  Ectopic tooth embedded in the right mandible as shown on image 18 of series 2, with a possible channel extending down to this ectopic tooth on image 26 of series 401.  IMPRESSION:  1.  Considerable soft tissue swelling tracking in the forehead  subcutaneous tissues down into the periorbital regions and upper cheek regions, without underlying fracture. No intraorbital extension.  Infectious process such as cellulitis is not excluded. 2.  Chronic right paranasal sinusitis. 3.  Cavities and periapical abscesses in several teeth as detailed above. 4.  Ectopic tooth imbedded in the right mandible, with a possible channel extending to one of the decayed teeth in this vicinity. Dental referral recommended.   Original Report Authenticated By: Van Clines, M.D.     Biopsy Results from 12/11/2005 -   KIDNEY, NEEDLE BIOPSY- COMBINED FOCAL LUPUS NEPHRITIS WITH 1 OF 21 GLOMERULI WITH SEGMENTAL ENDOCAPILLARY PROLIFERATION AND CELLULAR CRESCENT FORMATION (ISN/ RPS CLASS III(A)) AND LUPUS MEMBRANOUS GLOMERULOPATHY (ISN/RPS CLASS V, STAGE II). ACTIVITY INDEX= 4/24; CHRONICITY INDEX= 2/16.              Assessment & Plan  LOS: [age] y.o. year old female with Acute Preorbital Cellulitis and Gram + Bacteremia with history of SLE and Lupus Nephritis presenting after being lost to follow up with acute worsening of kidney function that has improved during this hospitalization with IV hydration.  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 Hx of Prednisone and CellCept for Lupus remission; previously on Enalapril to help with proteinuria.  Currently acutely bacteremic and not candidate for immunosuppression but will need OP treatment for Lupus Nephritis; will consider ACEi as OP.  Cr continuing to improve with hydration.  Will stop IVFs and encourage PO intake. Ordered - 24 hour protein/cr - Spot Protein/cr ratio - extractable nuclear antigens  # RTA - starting bicarb bid  # Preseptal Orbital Cellulitis with associated Bacteremia - per primary team:  # Anemia: Recommend iron studies  # Trichomonas  - per primary  # Medical Noncompliance and poor social situation: Needs close follow up but moves frequently.  Unclear circumstances around frequent moves.  Would encourage stabilizing home situation to be able to adequately address serious chronic medical condition.  # Cocaine & Marijuana Use:  Encouraged to quit high risk behaviors.    # Poor detention: needs follow up with [facility]   Gerda Diss, DO [name] Resident - PGY-1 01/18/2013 1:57  PM  I have seen and examined this patient and agree with plan per Dr Paulla Fore.  32 yo BF with hx focal proliferative and membranous lupus nephropathy who has been non compliant with FU and meds now admitted with facial cellulitis and gm + bacteremia.  Suspect she has had some progression of her underlying renal ds but will see where her Scr settles out after tx of there bacteremia.  I would not treat with immunosuppressives until after tx of her infection and upon proving her ability to follow up in outpt clinic.  Will quantify her proteinuria for now and re assess her lupus activity serologically. [name] Stefanski T,MD 01/18/2013 4:08 PM

## 2013-01-18 NOTE — Progress Notes (Addendum)
Internal Medicine Attending  Date: 01/18/2013  Patient name: [name] Medical record number: [id number] Date of birth: 08/20/1981 Age: 32 y.o. Gender: female  I saw and evaluated the patient and discussed her care on a.m. rounds with house staff.  She has been afebrile since 6 PM yesterday; she reports some improvement in her facial and eyelid swelling.  Exam shows mild improvement in her facial edema and tenderness.  Labs today are notable for bicarbonate 16 with normal anion gap, BUN 37, creatinine 1.78; WBC 19.9, hemoglobin 9.5, platelets 222; blood cultures (2 of 2) are growing gram-positive cocci in pairs.  Patient's facial cellulitis is somewhat better, although she still has significant edema of the forehead and eyelids; her renal function has improved a little with IV normal saline volume replacement.  Her gram-positive bacteremia is likely due to the cellulitis.  Her low bicarbonate with normal anion gap suggests a renal tubular acidosis.  The renal insufficiency is improving, and may be due to volume depletion; however, given her history of lupus nephritis and her urinalysis results with a negative urine culture, it is also concerning for lupus nephritis.  Plan is to continue intravenous vancomycin and ceftriaxone; continue IV normal saline; continue oral metronidazole for trichomonas UTI; nephrology consult; follow renal function; await identification of the gram-positive bacteremia.  Will await nephrology input regarding whether further steroid treatment is indicated.

## 2013-01-19 DIAGNOSIS — K006 Disturbances in tooth eruption: Secondary | ICD-10-CM

## 2013-01-19 DIAGNOSIS — A599 Trichomoniasis, unspecified: Secondary | ICD-10-CM

## 2013-01-19 LAB — CREATININE CLEARANCE, URINE, 24 HOUR
Collection Interval-CRCL: 24 hours
Creatinine Clearance: 22 mL/min — ABNORMAL LOW (ref 75–115)
Creatinine, 24H Ur: 436 mg/d — ABNORMAL LOW (ref 700–1800)
Creatinine, Urine: 87.18 mg/dL
Creatinine: 1.38 mg/dL — ABNORMAL HIGH (ref 0.50–1.10)

## 2013-01-19 LAB — CBC
HCT: 29 % — ABNORMAL LOW (ref 36.0–46.0)
MCH: 27.4 pg (ref 26.0–34.0)
MCHC: 34.1 g/dL (ref 30.0–36.0)
MCV: 80.3 fL (ref 78.0–100.0)
RDW: 13.8 % (ref 11.5–15.5)
WBC: 9.7 10*3/uL (ref 4.0–10.5)

## 2013-01-19 LAB — EXTRACTABLE NUCLEAR ANTIGEN ANTIBODY
SSA (Ro) (ENA) Antibody, IgG: 250 AU/mL — ABNORMAL HIGH (ref <30)
SSB (La) (ENA) Antibody, IgG: 4 AU/mL (ref <30)
Scleroderma (Scl-70) (ENA) Antibody, IgG: 2 AU/mL (ref <30)
Sm/rnp: 199 AU/mL — ABNORMAL HIGH (ref <30)
ds DNA Ab: 48 IU/mL — ABNORMAL HIGH (ref <30)

## 2013-01-19 LAB — BASIC METABOLIC PANEL
BUN: 32 mg/dL — ABNORMAL HIGH (ref 6–23)
Calcium: 7.8 mg/dL — ABNORMAL LOW (ref 8.4–10.5)
Chloride: 111 mEq/L (ref 96–112)
Creatinine, Ser: 1.38 mg/dL — ABNORMAL HIGH (ref 0.50–1.10)
GFR calc Af Amer: 59 mL/min — ABNORMAL LOW (ref >90)

## 2013-01-19 LAB — PROTEIN / CREATININE RATIO, URINE: Creatinine, Urine: 76.66 mg/dL

## 2013-01-19 MED ORDER — METRONIDAZOLE 500 MG PO TABS
2000.0000 mg | ORAL_TABLET | Freq: Once | ORAL | Status: AC
  Administered 2013-01-19: 2000 mg via ORAL
  Filled 2013-01-19: qty 4

## 2013-01-19 MED ORDER — METHYLPREDNISOLONE SODIUM SUCC 40 MG IJ SOLR
40.0000 mg | Freq: Three times a day (TID) | INTRAMUSCULAR | Status: AC
  Administered 2013-01-19 – 2013-01-20 (×3): 40 mg via INTRAVENOUS
  Filled 2013-01-19 (×4): qty 1

## 2013-01-19 MED ORDER — ENALAPRIL MALEATE 5 MG PO TABS
5.0000 mg | ORAL_TABLET | Freq: Two times a day (BID) | ORAL | Status: DC
  Administered 2013-01-19 – 2013-01-20 (×3): 5 mg via ORAL
  Filled 2013-01-19 (×4): qty 1

## 2013-01-19 NOTE — Progress Notes (Signed)
Internal Medicine Attending  Date: 01/19/2013  Patient name: [name] Medical record number: [id number] Date of birth: 07-14-1981 Age: 32 y.o. Gender: female  I saw and evaluated the patient and discussed her care on a.m. rounds with house staff.  She is afebrile, still has marked facial edema/erythema.  Blood cultures (2/2) are growing gram-positive cocci in pairs.  Creatinine is improving.  Plans include continue IV vancomycin and ceftriaxone for facial/preseptal cellulitis; consult ID given slow improvement and gram-positive bacteremia; continue oral metronidazole for trichomonas UTI; follow renal function; appreciate help from nephrology consult regarding evaluation and management of lupus nephritis.

## 2013-01-19 NOTE — Progress Notes (Addendum)
Subjective: No acute events overnight, afebrile  Objective: Vital signs in last 24 hours: Filed Vitals:   01/18/13 2056 01/19/13 0354 01/19/13 0558 01/19/13 0900  BP: 109/64 133/87  160/90  Pulse: 78 78  84  Temp: 97.5 F (36.4 C) 97.4 F (36.3 C) 98.4 F (36.9 C) 98.4 F (36.9 C)  TempSrc: Oral Oral Oral Oral  Resp: 18 18  20   Height:      Weight: 133 lb 2.5 oz (60.4 kg)     SpO2: 99% 98%  98%   Weight change: 13 lb 2.5 oz (5.968 kg)  Intake/Output Summary (Last 24 hours) at 01/19/13 1439 Last data filed at 01/19/13 0900  Gross per 24 hour  Intake   1905 ml  Output   1500 ml  Net    405 ml   Physical Exam: General: Well-developed, well-nourished, pleasant HEENT: persistent edema to bilateral eyes with erythema to bilateral eyelids, small eschar to upper medial forehead, cheeks appear more swollen today with 'chipmunk' appearance, scant whitish drainage to bilateral eyes, EOM difficult to access secondary to edema but pt denies pain on eye movement, no oropharygeal erythema or swelling, poor dentition Lungs: Normal respiratory effort. Clear to auscultation bilaterally from apices to bases without crackles or wheezes appreciated. Heart: normal rate, regular rhythm, normal S1 and S2, no gallop, murmur, or rubs appreciated. Abdomen: BS normoactive. Soft, Nondistended, non-tender. No masses or organomegaly appreciated. Extremities: No peripheral edema appreciated, distal pulses intact Neurologic: grossly non-focal, alert and oriented x3, appropriate and cooperative throughout examination.   Lab Results: Basic Metabolic Panel:  Lab [id number] 01/18/13 0445  NA 137 136  K 3.9 4.3  CL 111 110  CO2 18* 16*  GLUCOSE 69* 103*  BUN 32* 37*  CREATININE 1.38* 1.78*  CALCIUM 7.8* 7.0*  MG -- --  PHOS -- --   Liver Function Tests:  Lab 01/17/13 1500  AST 10  ALT 5  ALKPHOS 45  BILITOT 0.2*  PROT 6.5  ALBUMIN 1.2*   CBC:  Lab 01/19/13 0933 01/18/13 0445 01/17/13  0920  WBC 9.7 19.9* --  NEUTROABS -- -- 16.0*  HGB 9.9* 9.5* --  HCT 29.0* 28.9* --  MCV 80.3 81.9 --  PLT 240 222 --   Fasting Lipid Panel:  Lab 01/17/13 1500  CHOL 103  HDL 30*  LDLCALC 52  TRIG 105  CHOLHDL 3.4  LDLDIRECT --   Urine Drug Screen: Drugs of Abuse     Component Value Date/Time   LABOPIA NONE DETECTED 01/17/2013 1058   COCAINSCRNUR POSITIVE* 01/17/2013 1058   LABBENZ NONE DETECTED 01/17/2013 1058   AMPHETMU NONE DETECTED 01/17/2013 1058   THCU POSITIVE* 01/17/2013 1058   LABBARB NONE DETECTED 01/17/2013 1058    Urinalysis:  Lab 01/17/13 1058  COLORURINE YELLOW  LABSPEC 1.017  PHURINE 5.5  GLUCOSEU NEGATIVE  HGBUR LARGE*  BILIRUBINUR NEGATIVE  KETONESUR NEGATIVE  PROTEINUR >300*  UROBILINOGEN 1.0  NITRITE NEGATIVE  LEUKOCYTESUR TRACE*    Misc. Labs: Extractable Nuclear Antigen Ab SSA (Ro) (ENA) Antibody, IgG <30 AU/mL 250 (H)  Scleroderma (Scl-70) (ENA) Antibody, IgG <30 AU/mL 2  ENA <30 AU/mL 366 (H)  SSB (La) (ENA) Antibody, IgG <30 AU/mL 4   ds DNA Ab <30 IU/mL 48 (H) 53 (H)CM  Comments: (NOTE) < 30 IU/mL Negative 30-40 IU/mL Equivocal > 40 IU/mL Positive   Sm/rnp <30 AU/mL 199 (H)   GC/Chlam probe NEG  Micro Results: Recent Results (from the past 240 hour(s))  URINE CULTURE  Status: Normal   Collection Time   01/17/13 10:58 AM      Component Value Range Status Comment   Specimen Description URINE, RANDOM   Final    Special Requests NONE   Final    Culture  Setup Time 01/17/2013 12:27   Final    Colony Count NO GROWTH   Final    Culture NO GROWTH   Final    Report Status 01/18/2013 FINAL   Final   CULTURE, BLOOD (ROUTINE X 2)     Status: Normal (Preliminary result)   Collection Time   01/17/13  3:00 PM      Component Value Range Status Comment   Specimen Description BLOOD RIGHT ARM   Final    Special Requests BOTTLES DRAWN AEROBIC AND ANAEROBIC 10CC   Final    Culture  Setup Time 01/17/2013 21:08   Final    Culture     Final     Value: GRAM POSITIVE COCCI IN PAIRS     Note: Gram Stain Report Called to,Read Back By and Verified With: Avalee TONEY 01/18/13 1110 BY [email or web address]   Report Status PENDING   Incomplete   CULTURE, BLOOD (ROUTINE X 2)     Status: Normal (Preliminary result)   Collection Time   01/17/13  3:20 PM      Component Value Range Status Comment   Specimen Description BLOOD RIGHT HAND   Final    Special Requests BOTTLES DRAWN AEROBIC AND ANAEROBIC 10CC   Final    Culture  Setup Time 01/17/2013 21:08   Final    Culture     Final    Value: GRAM POSITIVE COCCI IN PAIRS     Note: Gram Stain Report Called to,Read Back By and Verified With: Marleen TONEY 01/18/13 1110 BY [email or web address]   Report Status PENDING   Incomplete   GC/CHLAMYDIA PROBE AMP     Status: Normal   Collection Time   01/17/13  3:58 PM      Component Value Range Status Comment   CT Probe RNA NEGATIVE  NEGATIVE Final    GC Probe RNA NEGATIVE  NEGATIVE Final    Studies/Results: Dg Chest 2 View  01/17/2013  *RADIOLOGY REPORT*  Clinical Data: Tachycardia  CHEST - 2 VIEW  Comparison: March 09, 2011  Findings:  Lungs clear.  Heart size and pulmonary vascularity are normal.  No adenopathy.  No bone lesions.  IMPRESSION: No abnormality noted.   Original Report Authenticated By: Lowella Grip, M.D.    Ct Maxillofacial Wo Cm  01/17/2013  *RADIOLOGY REPORT*  Clinical Data: Injury to the head with swelling in the eyes, fever.  CT MAXILLOFACIAL WITHOUT CONTRAST  Technique:  Multidetector CT imaging of the maxillofacial structures was performed. Multiplanar CT image reconstructions were also generated.  Comparison: None.  Findings: Forehead and periorbital soft tissue swelling tracks down into the subcutaneous tissues anterior to the maxilla bilaterally. Process appears bilaterally symmetric.  No postseptal extension into the orbits is identified.  The globes appear intact.  No fracture observed.  There is chronic right frontal and chronic right ethmoid  sinusitis.  Mild chronic sphenoid sinusitis.  Cavity and periapical abscess associated with tooth #15.  Cavities of teeth number 29, 30, and 31, with associated periapical abscesses.  Ectopic tooth embedded in the right mandible as shown on image 18 of series 2, with a possible channel extending down to this ectopic tooth on image 26 of series 401.  IMPRESSION:  1.  Considerable soft tissue  swelling tracking in the forehead subcutaneous tissues down into the periorbital regions and upper cheek regions, without underlying fracture. No intraorbital extension.  Infectious process such as cellulitis is not excluded. 2.  Chronic right paranasal sinusitis. 3.  Cavities and periapical abscesses in several teeth as detailed above. 4.  Ectopic tooth imbedded in the right mandible, with a possible channel extending to one of the decayed teeth in this vicinity. Dental referral recommended.   Original Report Authenticated By: Van Clines, M.D.    Medications: I have reviewed the patient's current medications. Scheduled Meds:   . enalapril  5 mg Oral BID  . heparin  5,000 Units Subcutaneous Q8H  . metroNIDAZOLE  2,000 mg Oral Once  . sodium bicarbonate  650 mg Oral BID  . sodium chloride  1,000 mL Intravenous Once  . vancomycin  1,000 mg Intravenous Q24H   Continuous Infusions:  PRN Meds:.acetaminophen, acetaminophen, ondansetron (ZOFRAN) IV, ondansetron  Assessment/Plan: HD#2  For 32 yo with past medical history significant for SLE and lupus nephritis admitted with preseptal and facial cellulitis.  1. Preseptal/Facial cellulitis: w/o evidence of orbital involvement on CT of face; Day #2 Vancomycin and Ceftriaxone, edema is minimally improved with continued erythema over her eyelids and inability to fully open her eyes. S/p methylprednisolone 40 mg IV x1. Leukocytosis resolved. -may benefit from addition IV steroids thus will give additional doses today and monitor improvement -cont antibiotics as noted  below for bacteremia   Lab 01/19/13 0933 01/18/13 0445 01/17/13 2033 01/17/13 0920  WBC 9.7 19.9* 19.8* 18.4*     2. Gram positive cocci Bacteremia: likely secondary to minor facial trauma, 2D ECHO without gross abnormalities or suggestive endocarditis. Echo on 01/18/2013- Normal LV size and systolic function, EF 0000000. Normal RV size and systolic function. No significant valvularabnormalities. Mild LAE.  -cont Vancomycin and Ceftriaxone with Pharmacy consultation for renal dosing if appropriate   3. Systemic Lupus Erythematous with Lupus nephritis in setting of Acute on Chronic Kidney Disease: S/p confirmatory bx 2006 per Dr. Marval Regal, which confirmed this diagnosis. Evaluated by Nephrology during this hospitalization, previous therapy of mycophenolate (CellCept) and prednisone immunosuppression deferred at this time given pt h/o noncompliance, currently on bid bicarbonate, creatinine level improving with IV fluid hydration -f/u with Nephrology as outpt in 2 weeks -f/u with Rheumatology at outpt -saline lock -Will start on ACEi today given elevated bp.   Lab 01/19/13 0933 01/18/13 0445 01/17/13 2033 01/17/13 0920  CREATININE 1.38* 1.78* 1.91* 2.18*     4.Trichomonas vaginalis infection: on Metronidazole therapy, RPR, HIV, GC/Chlam negative  Plan.  -cont Metronidazole for 7 day course   5. Hypertension: hasn't been on therapy for years, previous ACEi therapy enalapril -enalapril 5 mg bid  6. Polysubstance Drug abuse: UDS positive for cocaine and marijuana.  -SW consult  7. Disposition :   Anticipated discharge in approximately 1-2 day(s).  Patient's previous Rheumatologist contacted by Internal Medicine team.  Last seen in office 03/14/2007 then lost to follow-up.   Previous regimen included:  mycophenolate 1 gm bid Prednisone 5 mg qd Enalapril 10 mg qd Omeprazole 20 mg qd Tramadol 50 mg q6h prn  Follow-up scheduled for 02/09/2013 at 1:30pm with Rheumatologist Dr.  Charlestine Night.  The patient does have a current PCP (Default, Provider, MD), therefore is requiring OPC follow-up after discharge.   The patient does not have transportation limitations that hinder transportation to clinic appointments.  Services Needed at time of discharge: Y = Yes, Blank = No PT:   OT:  RN:   Equipment:   Other:     LOS: 2 days   Neeraj Housand, Santiago Glad 01/19/2013, 2:39 PM

## 2013-01-19 NOTE — Consult Note (Deleted)
[facility] Consult Note    Patient name: [name] Medical record number: [id number] Date of birth: September 14, 1981 Age: 32 y.o. Gender: female  Primary Care Provider: Default, Provider, MD Primary OP Nephrologist: Marval Regal (last seen 2008) Primary Hospital Service: IM Teaching Service Service Requesting Consult: same            SUBJECTIVE  [name] is a 32 y.o. year old female who presented on 1/21 with a 2 day history of facial pain and swelling.              OBJECTIVE  Vitals: Temp:  [97.4 F (36.3 C)-98.7 F (37.1 C)] 98.4 F (36.9 C) (01/23 0558) Pulse Rate:  [78-90] 78  (01/23 0354) Resp:  [18] 18  (01/23 0354) BP: (109-148)/(64-87) 133/87 mmHg (01/23 0354) SpO2:  [98 %-99 %] 98 % (01/23 0354) Weight:  [133 lb 2.5 oz (60.4 kg)] 133 lb 2.5 oz (60.4 kg) (01/22 2056)  Weight: Wt Readings from Last 3 Encounters:  01/18/13 133 lb 2.5 oz (60.4 kg)    I&Os: Yesterday: 01/22 0701 - 01/23 0700 In: 1905 [P.O.:680; I.V.:1225] Out: 1200 [Urine:1200] This shift: Total I/O In: -  Out: 550 [Urine:550]   PE: GENERAL:  Adult AA thin female.  Examined in 6700.  In no discomfort; norespiratory distress.   PSYCH: Alert and appropriately interactive; Insight:Good  Alert, oriented, thought content appropriate H&N: Markedly swollen and edematous face & B orbits THORAX: HEART: RRR, S1/S2 heard, no murmur LUNGS: CTA B, no wheezes, no crackles ABDOMEN:  +BS, soft, non-tender, no rigidity, no guarding, no masses/organomegaly EXTREMITIES: Moves all 4 extremities spontaneously, warm well perfused, no edema, bilateral DP and PT pulses 2+/4.    LABS  Lab 01/18/13 0445 01/17/13 2033 01/17/13 1500 01/17/13 0920  NA 136 -- -- 129*  K 4.3 -- -- 4.5  CL 110 -- -- 100  CO2 16* -- -- 16*  BUN 37* -- -- 35*  CREATININE 1.78* 1.91* -- 2.18*  CALCIUM 7.0* -- -- 7.8*  GLUCOSE 103* -- -- 99  MG -- -- -- --  PHOS -- -- -- --  PROT -- -- 6.5 --  ALBUMIN -- -- 1.2*  --    Lab 01/18/13 0445 01/17/13 2033 01/17/13 0920  WBC 19.9* 19.8* 18.4*  HGB 9.5* 10.0* 10.9*  HCT 28.9* 29.5* 32.6*  PLT 222 223 211  RDW 14.2 13.7 13.7  MCV 81.9 81.7 81.7  MCHC 32.9 33.9 33.4    Lab 01/17/13 1500  ALT 5  AST 10  ALKPHOS 45  BILITOT 0.2*  BILIDIR <0.1   No results found for this basename: AMMONIA:3,URICACID:3 in the last 8760 hours No results found for this basename: PTH:3 in the last 8760 hours    01/17/2013 15:00  ANA POSITIVE (A)  ANA Pattern 1 SPECKLED  ANA Titer 1 1:2560  ds DNA Ab 53 (H)   RPR NON REACTIVE  HIV NON REACTIVE   Hepatitis B Surface Ag NEGATIVE  Hep B C IgM NEGATIVE    Urine Studies  Basename 01/17/13 1058 07/05/12 1924  COLORURINE YELLOW YELLOW  APPEARANCEUR CLOUDY* CLOUDY*  LABSPEC 1.017 1.018  PHURINE 5.5 6.0  GLUCOSEU NEGATIVE NEGATIVE  HGBUR LARGE* MODERATE*  BILIRUBINUR NEGATIVE NEGATIVE  KETONESUR NEGATIVE NEGATIVE  PROTEINUR >300* >300*  UROBILINOGEN 1.0 0.2  NITRITE NEGATIVE POSITIVE*  LEUKOCYTESUR TRACE* SMALL*  Results for [name], [name] (MRN [id number]) as of 01/19/2013 07:42  01/17/2013 10:58  Total Protein, Urine 537  Cr pending  -  Normal LV size and systolic function, EF 0000000. Normal RV size and systolic function. No significant valvular abnormalities. Mild LAE.     MICRO: 01/17/13 - Urine Culture - No growth 01/17/13 - Blood Cx X 2 - Gram Positive Cocci in Pairs   IMAGING: US Renal 01/17/2013  *RADIOLOGY REPORT*  Clinical Data: Lupus.  Nephrotic syndrome.  RENAL/URINARY TRACT ULTRASOUND COMPLETE  Comparison:  12/27/2005  Findings:  Right Kidney:  Mildly echogenic renal pyramids.  13.2 cm in length. No renal stones, mass, or hydronephrosis.  Left Kidney:  Mildly hyperechoic pyramids, as on the contralateral side.  13.2 cm in length.  Otherwise negative.  Bladder:  Empty during exam.  IMPRESSION: 1.  Slightly echogenic renal pyramids, suspicious for chronic glomerulonephritis.   Original Report  Authenticated By: Van Clines, M.D.    Dg Chest 2 View  01/17/2013  *RADIOLOGY REPORT*  Clinical Data: Tachycardia  CHEST - 2 VIEW  Comparison: March 09, 2011  Findings:  Lungs clear.  Heart size and pulmonary vascularity are normal.  No adenopathy.  No bone lesions.  IMPRESSION: No abnormality noted.   Original Report Authenticated By: Lowella Grip, M.D.    US Renal  01/17/2013  *RADIOLOGY REPORT*  Clinical Data: Lupus.  Nephrotic syndrome.  RENAL/URINARY TRACT ULTRASOUND COMPLETE  Comparison:  12/27/2005  Findings:  Right Kidney:  Mildly echogenic renal pyramids.  13.2 cm in length. No renal stones, mass, or hydronephrosis.  Left Kidney:  Mildly hyperechoic pyramids, as on the contralateral side.  13.2 cm in length.  Otherwise negative.  Bladder:  Empty during exam.  IMPRESSION: 1.  Slightly echogenic renal pyramids, suspicious for chronic glomerulonephritis.   Original Report Authenticated By: Van Clines, M.D.    Ct Maxillofacial Wo Cm  01/17/2013  *RADIOLOGY REPORT*  Clinical Data: Injury to the head with swelling in the eyes, fever.  CT MAXILLOFACIAL WITHOUT CONTRAST  Technique:  Multidetector CT imaging of the maxillofacial structures was performed. Multiplanar CT image reconstructions were also generated.  Comparison: None.  Findings: Forehead and periorbital soft tissue swelling tracks down into the subcutaneous tissues anterior to the maxilla bilaterally. Process appears bilaterally symmetric.  No postseptal extension into the orbits is identified.  The globes appear intact.  No fracture observed.  There is chronic right frontal and chronic right ethmoid sinusitis.  Mild chronic sphenoid sinusitis.  Cavity and periapical abscess associated with tooth #15.  Cavities of teeth number 29, 30, and 31, with associated periapical abscesses.  Ectopic tooth embedded in the right mandible as shown on image 18 of series 2, with a possible channel extending down to this ectopic tooth on image  26 of series 401.  IMPRESSION:  1.  Considerable soft tissue swelling tracking in the forehead subcutaneous tissues down into the periorbital regions and upper cheek regions, without underlying fracture. No intraorbital extension.  Infectious process such as cellulitis is not excluded. 2.  Chronic right paranasal sinusitis. 3.  Cavities and periapical abscesses in several teeth as detailed above. 4.  Ectopic tooth imbedded in the right mandible, with a possible channel extending to one of the decayed teeth in this vicinity. Dental referral recommended.   Original Report Authenticated By: Van Clines, M.D.    2D ECHO 01/18/13 =  - Normal LV size and systolic function, EF 0000000. Normal RV size and systolic function. No significant valvular abnormalities. Mild LAE.     Biopsy Results from 12/11/2005 -   KIDNEY, NEEDLE BIOPSY- COMBINED FOCAL LUPUS NEPHRITIS WITH 1 OF 21 GLOMERULI  WITH SEGMENTAL ENDOCAPILLARY PROLIFERATION AND CELLULAR CRESCENT FORMATION (ISN/ RPS CLASS III(A)) AND LUPUS MEMBRANOUS GLOMERULOPATHY (ISN/RPS CLASS V, STAGE II). ACTIVITY INDEX= 4/24; CHRONICITY INDEX= 2/16.   Medications:      . cefTRIAXone (ROCEPHIN)  IV  1 g Intravenous Q24H  . heparin  5,000 Units Subcutaneous Q8H  . metroNIDAZOLE  500 mg Oral Q12H  . sodium bicarbonate  650 mg Oral BID  . sodium chloride  1,000 mL Intravenous Once  . vancomycin  1,000 mg Intravenous Q24H              Assessment & Plan  LOS: [age] y.o. year old female with Acute Preorbital Cellulitis and Gram + Bacteremia with history of SLE and Lupus Nephritis presenting after being lost to follow up with acute worsening of kidney function that has improved during this hospitalization with IV hydration.  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 Hx of Prednisone and CellCept for  Lupus remission; previously on Enalapril to help with proteinuria.  Currently acutely bacteremic and not candidate for immunosuppression but will need OP treatment for Lupus Nephritis; will consider ACEi as OP.  Cr continuing to improve with hydration but rate of improvement slowing.  Will stop IVFs and encourage PO intake. Ordered > 24 hour protein/cr , pending > Spot Protein/cr ratio pending due to 24 hour collection > extractable nuclear antigens pending  # RTA - continue bicarb bid  # Preseptal Orbital Cellulitis with associated Bacteremia - per primary team:  # Anemia: Recommend iron studies  # Trichomonas - per primary  # Medical Noncompliance and poor social situation: Needs close follow up but moves frequently.  Unclear circumstances around frequent moves.  Would encourage stabilizing home situation to be able to adequately address serious chronic medical condition.  # Cocaine & Marijuana Use:  Encouraged to quit high risk behaviors.    # Poor detention: needs follow up with [facility]   Gerda Diss, DO [facility] Resident - PGY-1 01/19/2013 6:20 AM

## 2013-01-19 NOTE — Progress Notes (Signed)
[facility] Consult Note    Patient name: [name] Medical record number: [id number] Date of birth: 01/04/1981 Age: 32 y.o. Gender: female  Primary Care Provider: Default, Provider, MD Primary OP Nephrologist: Marval Regal (last seen 2008) Primary Hospital Service: IM Teaching Service Service Requesting Consult: same            SUBJECTIVE  [name] is a 32 y.o. year old female who presented on 1/21 with a 2 day history of facial pain and swelling.              OBJECTIVE  Vitals: Temp:  [97.4 F (36.3 C)-98.7 F (37.1 C)] 98.4 F (36.9 C) (01/23 0558) Pulse Rate:  [78-90] 78  (01/23 0354) Resp:  [18] 18  (01/23 0354) BP: (109-148)/(64-87) 133/87 mmHg (01/23 0354) SpO2:  [98 %-99 %] 98 % (01/23 0354) Weight:  [133 lb 2.5 oz (60.4 kg)] 133 lb 2.5 oz (60.4 kg) (01/22 2056)  Weight: Wt Readings from Last 3 Encounters:  01/18/13 133 lb 2.5 oz (60.4 kg)    I&Os: Yesterday: 01/22 0701 - 01/23 0700 In: 1905 [P.O.:680; I.V.:1225] Out: 1200 [Urine:1200] This shift:     PE: GENERAL:  Adult AA thin female.  Examined in 6700.  In no discomfort; norespiratory distress.   PSYCH: Alert and appropriately interactive; Insight:Good  Alert, oriented, thought content appropriate H&N: Markedly swollen and edematous face & B orbits THORAX: HEART: RRR, S1/S2 heard, no murmur LUNGS: CTA B, no wheezes, no crackles ABDOMEN:  +BS, soft, non-tender, no rigidity, no guarding, no masses/organomegaly EXTREMITIES: Moves all 4 extremities spontaneously, warm well perfused, no edema, bilateral DP and PT pulses 2+/4.    LABS  Lab 01/18/13 0445 01/17/13 2033 01/17/13 1500 01/17/13 0920  NA 136 -- -- 129*  K 4.3 -- -- 4.5  CL 110 -- -- 100  CO2 16* -- -- 16*  BUN 37* -- -- 35*  CREATININE 1.78* 1.91* -- 2.18*  CALCIUM 7.0* -- -- 7.8*  GLUCOSE 103* -- -- 99  MG -- -- -- --  PHOS -- -- -- --  PROT -- -- 6.5 --  ALBUMIN -- -- 1.2* --    Lab 01/18/13 0445 01/17/13  2033 01/17/13 0920  WBC 19.9* 19.8* 18.4*  HGB 9.5* 10.0* 10.9*  HCT 28.9* 29.5* 32.6*  PLT 222 223 211  RDW 14.2 13.7 13.7  MCV 81.9 81.7 81.7  MCHC 32.9 33.9 33.4    Lab 01/17/13 1500  ALT 5  AST 10  ALKPHOS 45  BILITOT 0.2*  BILIDIR <0.1   No results found for this basename: AMMONIA:3,URICACID:3 in the last 8760 hours No results found for this basename: PTH:3 in the last 8760 hours    01/17/2013 15:00  ANA POSITIVE (A)  ANA Pattern 1 SPECKLED  ANA Titer 1 1:2560  ds DNA Ab 53 (H)   RPR NON REACTIVE  HIV NON REACTIVE   Hepatitis B Surface Ag NEGATIVE  Hep B C IgM NEGATIVE    Urine Studies  Basename 01/17/13 1058 07/05/12 1924  COLORURINE YELLOW YELLOW  APPEARANCEUR CLOUDY* CLOUDY*  LABSPEC 1.017 1.018  PHURINE 5.5 6.0  GLUCOSEU NEGATIVE NEGATIVE  HGBUR LARGE* MODERATE*  BILIRUBINUR NEGATIVE NEGATIVE  KETONESUR NEGATIVE NEGATIVE  PROTEINUR >300* >300*  UROBILINOGEN 1.0 0.2  NITRITE NEGATIVE POSITIVE*  LEUKOCYTESUR TRACE* SMALL*  Results for [name], [name] (MRN [id number]) as of 01/19/2013 07:42  01/17/2013 10:58  Total Protein, Urine 537  Cr pending  - Normal LV size and systolic  function, EF 55-60%. Normal RV size and systolic function. No significant valvular abnormalities. Mild LAE.     MICRO: 01/17/13 - Urine Culture - No growth 01/17/13 - Blood Cx X 2 - Gram Positive Cocci in Pairs   IMAGING: US Renal 01/17/2013  *RADIOLOGY REPORT*  Clinical Data: Lupus.  Nephrotic syndrome.  RENAL/URINARY TRACT ULTRASOUND COMPLETE  Comparison:  12/27/2005  Findings:  Right Kidney:  Mildly echogenic renal pyramids.  13.2 cm in length. No renal stones, mass, or hydronephrosis.  Left Kidney:  Mildly hyperechoic pyramids, as on the contralateral side.  13.2 cm in length.  Otherwise negative.  Bladder:  Empty during exam.  IMPRESSION: 1.  Slightly echogenic renal pyramids, suspicious for chronic glomerulonephritis.   Original Report Authenticated By: Van Clines,  M.D.    Dg Chest 2 View  01/17/2013  *RADIOLOGY REPORT*  Clinical Data: Tachycardia  CHEST - 2 VIEW  Comparison: March 09, 2011  Findings:  Lungs clear.  Heart size and pulmonary vascularity are normal.  No adenopathy.  No bone lesions.  IMPRESSION: No abnormality noted.   Original Report Authenticated By: Lowella Grip, M.D.    US Renal  01/17/2013  *RADIOLOGY REPORT*  Clinical Data: Lupus.  Nephrotic syndrome.  RENAL/URINARY TRACT ULTRASOUND COMPLETE  Comparison:  12/27/2005  Findings:  Right Kidney:  Mildly echogenic renal pyramids.  13.2 cm in length. No renal stones, mass, or hydronephrosis.  Left Kidney:  Mildly hyperechoic pyramids, as on the contralateral side.  13.2 cm in length.  Otherwise negative.  Bladder:  Empty during exam.  IMPRESSION: 1.  Slightly echogenic renal pyramids, suspicious for chronic glomerulonephritis.   Original Report Authenticated By: Van Clines, M.D.    Ct Maxillofacial Wo Cm  01/17/2013  *RADIOLOGY REPORT*  Clinical Data: Injury to the head with swelling in the eyes, fever.  CT MAXILLOFACIAL WITHOUT CONTRAST  Technique:  Multidetector CT imaging of the maxillofacial structures was performed. Multiplanar CT image reconstructions were also generated.  Comparison: None.  Findings: Forehead and periorbital soft tissue swelling tracks down into the subcutaneous tissues anterior to the maxilla bilaterally. Process appears bilaterally symmetric.  No postseptal extension into the orbits is identified.  The globes appear intact.  No fracture observed.  There is chronic right frontal and chronic right ethmoid sinusitis.  Mild chronic sphenoid sinusitis.  Cavity and periapical abscess associated with tooth #15.  Cavities of teeth number 29, 30, and 31, with associated periapical abscesses.  Ectopic tooth embedded in the right mandible as shown on image 18 of series 2, with a possible channel extending down to this ectopic tooth on image 26 of series 401.  IMPRESSION:  1.   Considerable soft tissue swelling tracking in the forehead subcutaneous tissues down into the periorbital regions and upper cheek regions, without underlying fracture. No intraorbital extension.  Infectious process such as cellulitis is not excluded. 2.  Chronic right paranasal sinusitis. 3.  Cavities and periapical abscesses in several teeth as detailed above. 4.  Ectopic tooth imbedded in the right mandible, with a possible channel extending to one of the decayed teeth in this vicinity. Dental referral recommended.   Original Report Authenticated By: Van Clines, M.D.    2D ECHO 01/18/13 =  - Normal LV size and systolic function, EF 0000000. Normal RV size and systolic function. No significant valvular abnormalities. Mild LAE.     Biopsy Results from 12/11/2005 -   KIDNEY, NEEDLE BIOPSY- COMBINED FOCAL LUPUS NEPHRITIS WITH 1 OF 21 GLOMERULI WITH SEGMENTAL ENDOCAPILLARY PROLIFERATION AND  CELLULAR CRESCENT FORMATION (ISN/ RPS CLASS III(A)) AND LUPUS MEMBRANOUS GLOMERULOPATHY (ISN/RPS CLASS V, STAGE II). ACTIVITY INDEX= 4/24; CHRONICITY INDEX= 2/16.   Medications:       . cefTRIAXone (ROCEPHIN)  IV  1 g Intravenous Q24H  . heparin  5,000 Units Subcutaneous Q8H  . metroNIDAZOLE  500 mg Oral Q12H  . sodium bicarbonate  650 mg Oral BID  . sodium chloride  1,000 mL Intravenous Once  . vancomycin  1,000 mg Intravenous Q24H              Assessment & Plan  LOS: [age] y.o. year old female with Acute Preorbital Cellulitis and Gram + Bacteremia with history of SLE and Lupus Nephritis presenting after being lost to follow up with acute worsening of kidney function that has improved during this hospitalization with IV hydration.  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 Hx of Prednisone and CellCept for Lupus remission; previously on  Enalapril to help with proteinuria.  Currently acutely bacteremic and not candidate for immunosuppression but will need OP treatment for Lupus Nephritis; will consider ACEi as OP.  Cr continuing to improve with hydration but rate of improvement slowing.  Will stop IVFs and encourage PO intake. Ordered > 24 hour protein/cr , pending > Spot Protein/cr ratio pending due to 24 hour collection > extractable nuclear antigens pending  # RTA - continue bicarb bid  # Preseptal Orbital Cellulitis with associated Bacteremia - per primary team:  # Anemia: Recommend iron studies  # Trichomonas - per primary  # Medical Noncompliance and poor social situation: Needs close follow up but moves frequently.  Unclear circumstances around frequent moves.  Would encourage stabilizing home situation to be able to adequately address serious chronic medical condition.  # Cocaine & Marijuana Use:  Encouraged to quit high risk behaviors.    # Poor detention: needs follow up with [facility]   Gerda Diss, DO [place] Resident - PGY-1 01/19/2013 8:04 AM I have seen and examined this patient and agree with plan Dr Paulla Fore.  Awaiting 24 hr urine and serologies.  She will need to receive treatment as outpt once she shows that she can followup.  Will schedule appt with Dr Marval Regal for 2 wks to discuss treatment.  Will sign off for now. Macrina Lehnert T,MD 01/19/2013 10:36 AM

## 2013-01-19 NOTE — Consult Note (Signed)
INFECTIOUS DISEASE CONSULT NOTE  Date of Admission:  01/17/2013  Date of Consult:  01/19/2013  Reason for Consult: facial/peri-orbital cellulitis Referring Physician: Joines/Kazibwe  Impression/Recommendation Facial/peri-orbital cellulitis Poor dentition Bacteremia SLE ARF (improving)  Would Change anbx to vanco alone Ask dental to eval (Dr Enrique Sack is on staff) Consider MRI/A to r/o cavernous sinus thrombosis (clear with renal first) Recheck her BCx Change flagyl to 2g po x 1 Consider TEE  Comment- she has significant edema, could this be a toxic shock like variant/toxin mediated effect adjacent to her wound? Her BCx raises the need for further evaluation- consider TEE. She has a hx of cocaine abuse. She will need rheum f/u.   Thank you so much for this interesting consult,   [name] [id number]  [name] is an 32 y.o. female.  HPI: 32 yo F with hx of SLE for last 6 years, off rx for last 2 years. On 1-18 she hit her frontal area on a car door. She has 2 small scratches, which have never drained. She did well on 1-19 but since developed worsening facial swelling. She came to ED on 1-21, had temp 101.8 and WBC 18.4. She was also found to have trichomonas in her UA. She was started on vanco/ceftriaxone/metronidazole. Her fever has since resovled but her facial swelling has worsened by her report.   Past Medical History  Diagnosis Date  . Lupus (systemic lupus erythematosus)   . Lupus nephritis   . S/P pericardiocentesis 01/17/2013    H/o pericardial effusion with tamponade 2006   . H/O pleural effusion 01/17/2013  . H/O pericarditis 01/17/2013  . Cocaine abuse 01/18/2013    No past surgical history on file.   Allergies  Allergen Reactions  . Tobramycin Sulfate Swelling    Medications:  Scheduled:   . cefTRIAXone (ROCEPHIN)  IV  1 g Intravenous Q24H  . heparin  5,000 Units Subcutaneous Q8H  . metroNIDAZOLE  500 mg Oral Q12H  . sodium bicarbonate  650 mg  Oral BID  . sodium chloride  1,000 mL Intravenous Once  . vancomycin  1,000 mg Intravenous Q24H    Total days of antibiotics 3 (ceftriaxone/flagyl/vanco)          Social History:  reports that she has been smoking.  She does not have any smokeless tobacco history on file. She reports that she drinks alcohol. She reports that she uses illicit drugs (Marijuana and Cocaine) about 5 times per week. 1/3 ppd tobacco.   No family history on file. pt states she has a FHx of gout, SLE, HTN  General ROS: + diarrhea. no vision change. no dysphagia. no dysphonia, no stridor. see HPI.   Blood pressure 160/90, pulse 84, temperature 98.4 F (36.9 C), temperature source Oral, resp. rate 20, height 5\' 4"  (1.626 m), weight 60.4 kg (133 lb 2.5 oz), last menstrual period 01/17/2013, SpO2 98.00%. General appearance: alert, cooperative and mild distress Head: upper face markedly swollen. down to mid-nares. opens only to slit width.  Eyes: positive findings: eyelids/periorbital: periorbital edema bilaterally and conjunctiva: 3+ injection Throat: normal findings: oropharynx pink & moist without lesions or evidence of thrush Neck: no adenopathy, supple, symmetrical, trachea midline and FROM Lungs: clear to auscultation bilaterally Heart: regular rate and rhythm Abdomen: normal findings: bowel sounds normal and soft, non-tender Extremities: edema none and multiple scars, wounds. she has no joint effusions in her elbow, wrist, ankle, knees.    Results for orders placed during the hospital encounter of 01/17/13 (from the  past 48 hour(s))  HEPATIC FUNCTION PANEL     Status: Abnormal   Collection Time   01/17/13  3:00 PM      Component Value Range Comment   Total Protein 6.5  6.0 - 8.3 g/dL    Albumin 1.2 (*) 3.5 - 5.2 g/dL    AST 10  0 - 37 U/L    ALT 5  0 - 35 U/L    Alkaline Phosphatase 45  39 - 117 U/L    Total Bilirubin 0.2 (*) 0.3 - 1.2 mg/dL    Bilirubin, Direct <0.1  0.0 - 0.3 mg/dL    Indirect  Bilirubin NOT CALCULATED  0.3 - 0.9 mg/dL   HIV ANTIBODY (ROUTINE TESTING)     Status: Normal   Collection Time   01/17/13  3:00 PM      Component Value Range Comment   HIV NON REACTIVE  NON REACTIVE   HEPATITIS PANEL, ACUTE     Status: Normal   Collection Time   01/17/13  3:00 PM      Component Value Range Comment   Hepatitis B Surface Ag NEGATIVE  NEGATIVE    HCV Ab NEGATIVE  NEGATIVE    Hep A IgM NEGATIVE  NEGATIVE    Hep B C IgM NEGATIVE  NEGATIVE   RPR     Status: Normal   Collection Time   01/17/13  3:00 PM      Component Value Range Comment   RPR NON REACTIVE  NON REACTIVE   ANA     Status: Abnormal   Collection Time   01/17/13  3:00 PM      Component Value Range Comment   ANA POSITIVE (*) NEGATIVE   ANTI-DNA ANTIBODY, DOUBLE-STRANDED     Status: Abnormal   Collection Time   01/17/13  3:00 PM      Component Value Range Comment   ds DNA Ab 53 (*) <30 IU/mL   LIPID PANEL     Status: Abnormal   Collection Time   01/17/13  3:00 PM      Component Value Range Comment   Cholesterol 103  0 - 200 mg/dL    Triglycerides 105  <150 mg/dL    HDL 30 (*) >39 mg/dL    Total CHOL/HDL Ratio 3.4      VLDL 21  0 - 40 mg/dL    LDL Cholesterol 52  0 - 99 mg/dL   CULTURE, BLOOD (ROUTINE X 2)     Status: Normal (Preliminary result)   Collection Time   01/17/13  3:00 PM      Component Value Range Comment   Specimen Description BLOOD RIGHT ARM      Special Requests BOTTLES DRAWN AEROBIC AND ANAEROBIC 10CC      Culture  Setup Time 01/17/2013 21:08      Culture        Value: GRAM POSITIVE COCCI IN PAIRS     Note: Gram Stain Report Called to,Read Back By and Verified With: Enas TONEY 01/18/13 1110 BY SMITHERSJ   Report Status PENDING     ANTI-NUCLEAR AB-TITER (ANA TITER)     Status: Normal   Collection Time   01/17/13  3:00 PM      Component Value Range Comment   ANA Titer 1 1:2560  <1:40    ANA Pattern 1 SPECKLED     LACTIC ACID, PLASMA     Status: Normal   Collection Time   01/17/13   3:06 PM  Component Value Range Comment   Lactic Acid, Venous 0.6  0.5 - 2.2 mmol/L   CULTURE, BLOOD (ROUTINE X 2)     Status: Normal (Preliminary result)   Collection Time   01/17/13  3:20 PM      Component Value Range Comment   Specimen Description BLOOD RIGHT HAND      Special Requests BOTTLES DRAWN AEROBIC AND ANAEROBIC 10CC      Culture  Setup Time 01/17/2013 21:08      Culture        Value: GRAM POSITIVE COCCI IN PAIRS     Note: Gram Stain Report Called to,Read Back By and Verified With: Lorriane TONEY 01/18/13 1110 BY SMITHERSJ   Report Status PENDING     GC/CHLAMYDIA PROBE AMP     Status: Normal   Collection Time   01/17/13  3:58 PM      Component Value Range Comment   CT Probe RNA NEGATIVE  NEGATIVE    GC Probe RNA NEGATIVE  NEGATIVE   CBC     Status: Abnormal   Collection Time   01/17/13  8:33 PM      Component Value Range Comment   WBC 19.8 (*) 4.0 - 10.5 K/uL    RBC 3.61 (*) 3.87 - 5.11 MIL/uL    Hemoglobin 10.0 (*) 12.0 - 15.0 g/dL    HCT 29.5 (*) 36.0 - 46.0 %    MCV 81.7  78.0 - 100.0 fL    MCH 27.7  26.0 - 34.0 pg    MCHC 33.9  30.0 - 36.0 g/dL    RDW 13.7  11.5 - 15.5 %    Platelets 223  150 - 400 K/uL   CREATININE, SERUM     Status: Abnormal   Collection Time   01/17/13  8:33 PM      Component Value Range Comment   Creatinine, Ser 1.91 (*) 0.50 - 1.10 mg/dL    GFR calc non Af Amer 34 (*) >90 mL/min    GFR calc Af Amer 40 (*) >90 mL/min   BASIC METABOLIC PANEL     Status: Abnormal   Collection Time   01/18/13  4:45 AM      Component Value Range Comment   Sodium 136  135 - 145 mEq/L DELTA CHECK NOTED   Potassium 4.3  3.5 - 5.1 mEq/L    Chloride 110  96 - 112 mEq/L DELTA CHECK NOTED   CO2 16 (*) 19 - 32 mEq/L    Glucose, Bld 103 (*) 70 - 99 mg/dL    BUN 37 (*) 6 - 23 mg/dL    Creatinine, Ser 1.78 (*) 0.50 - 1.10 mg/dL    Calcium 7.0 (*) 8.4 - 10.5 mg/dL    GFR calc non Af Amer 38 (*) >90 mL/min    GFR calc Af Amer 43 (*) >90 mL/min   CBC     Status:  Abnormal   Collection Time   01/18/13  4:45 AM      Component Value Range Comment   WBC 19.9 (*) 4.0 - 10.5 K/uL    RBC 3.53 (*) 3.87 - 5.11 MIL/uL    Hemoglobin 9.5 (*) 12.0 - 15.0 g/dL    HCT 28.9 (*) 36.0 - 46.0 %    MCV 81.9  78.0 - 100.0 fL    MCH 26.9  26.0 - 34.0 pg    MCHC 32.9  30.0 - 36.0 g/dL    RDW 14.2  11.5 - 15.5 %  Platelets 222  150 - 400 K/uL   CBC     Status: Abnormal   Collection Time   01/19/13  9:33 AM      Component Value Range Comment   WBC 9.7  4.0 - 10.5 K/uL    RBC 3.61 (*) 3.87 - 5.11 MIL/uL    Hemoglobin 9.9 (*) 12.0 - 15.0 g/dL    HCT 29.0 (*) 36.0 - 46.0 %    MCV 80.3  78.0 - 100.0 fL    MCH 27.4  26.0 - 34.0 pg    MCHC 34.1  30.0 - 36.0 g/dL    RDW 13.8  11.5 - 15.5 %    Platelets 240  150 - 400 K/uL   BASIC METABOLIC PANEL     Status: Abnormal   Collection Time   01/19/13  9:33 AM      Component Value Range Comment   Sodium 137  135 - 145 mEq/L    Potassium 3.9  3.5 - 5.1 mEq/L    Chloride 111  96 - 112 mEq/L    CO2 18 (*) 19 - 32 mEq/L    Glucose, Bld 69 (*) 70 - 99 mg/dL    BUN 32 (*) 6 - 23 mg/dL    Creatinine, Ser 1.38 (*) 0.50 - 1.10 mg/dL    Calcium 7.8 (*) 8.4 - 10.5 mg/dL    GFR calc non Af Amer 51 (*) >90 mL/min    GFR calc Af Amer 59 (*) >90 mL/min       Component Value Date/Time   SDES BLOOD RIGHT HAND 01/17/2013 1520   SPECREQUEST BOTTLES DRAWN AEROBIC AND ANAEROBIC 10CC 01/17/2013 1520   CULT  Value: GRAM POSITIVE COCCI IN PAIRS Note: Gram Stain Report Called to,Read Back By and Verified With: Simi TONEY 01/18/13 1110 BY SMITHERSJ 01/17/2013 1520   REPTSTATUS PENDING 01/17/2013 1520   Dg Chest 2 View  01/17/2013  *RADIOLOGY REPORT*  Clinical Data: Tachycardia  CHEST - 2 VIEW  Comparison: March 09, 2011  Findings:  Lungs clear.  Heart size and pulmonary vascularity are normal.  No adenopathy.  No bone lesions.  IMPRESSION: No abnormality noted.   Original Report Authenticated By: Lowella Grip, M.D.    US  Renal  01/17/2013  *RADIOLOGY REPORT*  Clinical Data: Lupus.  Nephrotic syndrome.  RENAL/URINARY TRACT ULTRASOUND COMPLETE  Comparison:  12/27/2005  Findings:  Right Kidney:  Mildly echogenic renal pyramids.  13.2 cm in length. No renal stones, mass, or hydronephrosis.  Left Kidney:  Mildly hyperechoic pyramids, as on the contralateral side.  13.2 cm in length.  Otherwise negative.  Bladder:  Empty during exam.  IMPRESSION: 1.  Slightly echogenic renal pyramids, suspicious for chronic glomerulonephritis.   Original Report Authenticated By: Van Clines, M.D.    Ct Maxillofacial Wo Cm  01/17/2013  *RADIOLOGY REPORT*  Clinical Data: Injury to the head with swelling in the eyes, fever.  CT MAXILLOFACIAL WITHOUT CONTRAST  Technique:  Multidetector CT imaging of the maxillofacial structures was performed. Multiplanar CT image reconstructions were also generated.  Comparison: None.  Findings: Forehead and periorbital soft tissue swelling tracks down into the subcutaneous tissues anterior to the maxilla bilaterally. Process appears bilaterally symmetric.  No postseptal extension into the orbits is identified.  The globes appear intact.  No fracture observed.  There is chronic right frontal and chronic right ethmoid sinusitis.  Mild chronic sphenoid sinusitis.  Cavity and periapical abscess associated with tooth #15.  Cavities of teeth number 29, 30,  and 31, with associated periapical abscesses.  Ectopic tooth embedded in the right mandible as shown on image 18 of series 2, with a possible channel extending down to this ectopic tooth on image 26 of series 401.  IMPRESSION:  1.  Considerable soft tissue swelling tracking in the forehead subcutaneous tissues down into the periorbital regions and upper cheek regions, without underlying fracture. No intraorbital extension.  Infectious process such as cellulitis is not excluded. 2.  Chronic right paranasal sinusitis. 3.  Cavities and periapical abscesses in several  teeth as detailed above. 4.  Ectopic tooth imbedded in the right mandible, with a possible channel extending to one of the decayed teeth in this vicinity. Dental referral recommended.   Original Report Authenticated By: Van Clines, M.D.    Recent Results (from the past 240 hour(s))  URINE CULTURE     Status: Normal   Collection Time   01/17/13 10:58 AM      Component Value Range Status Comment   Specimen Description URINE, RANDOM   Final    Special Requests NONE   Final    Culture  Setup Time 01/17/2013 12:27   Final    Colony Count NO GROWTH   Final    Culture NO GROWTH   Final    Report Status 01/18/2013 FINAL   Final   CULTURE, BLOOD (ROUTINE X 2)     Status: Normal (Preliminary result)   Collection Time   01/17/13  3:00 PM      Component Value Range Status Comment   Specimen Description BLOOD RIGHT ARM   Final    Special Requests BOTTLES DRAWN AEROBIC AND ANAEROBIC 10CC   Final    Culture  Setup Time 01/17/2013 21:08   Final    Culture     Final    Value: GRAM POSITIVE COCCI IN PAIRS     Note: Gram Stain Report Called to,Read Back By and Verified With: Yuliana TONEY 01/18/13 1110 BY SMITHERSJ   Report Status PENDING   Incomplete   CULTURE, BLOOD (ROUTINE X 2)     Status: Normal (Preliminary result)   Collection Time   01/17/13  3:20 PM      Component Value Range Status Comment   Specimen Description BLOOD RIGHT HAND   Final    Special Requests BOTTLES DRAWN AEROBIC AND ANAEROBIC 10CC   Final    Culture  Setup Time 01/17/2013 21:08   Final    Culture     Final    Value: GRAM POSITIVE COCCI IN PAIRS     Note: Gram Stain Report Called to,Read Back By and Verified With: Fallon TONEY 01/18/13 1110 BY SMITHERSJ   Report Status PENDING   Incomplete   GC/CHLAMYDIA PROBE AMP     Status: Normal   Collection Time   01/17/13  3:58 PM      Component Value Range Status Comment   CT Probe RNA NEGATIVE  NEGATIVE Final    GC Probe RNA NEGATIVE  NEGATIVE Final       01/19/2013, 1:55 PM      LOS: 2 days

## 2013-01-20 LAB — CBC
HCT: 30.6 % — ABNORMAL LOW (ref 36.0–46.0)
Hemoglobin: 10.1 g/dL — ABNORMAL LOW (ref 12.0–15.0)
MCH: 26.6 pg (ref 26.0–34.0)
RBC: 3.79 MIL/uL — ABNORMAL LOW (ref 3.87–5.11)

## 2013-01-20 LAB — BASIC METABOLIC PANEL
BUN: 36 mg/dL — ABNORMAL HIGH (ref 6–23)
CO2: 18 mEq/L — ABNORMAL LOW (ref 19–32)
Calcium: 8 mg/dL — ABNORMAL LOW (ref 8.4–10.5)
Glucose, Bld: 104 mg/dL — ABNORMAL HIGH (ref 70–99)
Potassium: 4.3 mEq/L (ref 3.5–5.1)
Sodium: 136 mEq/L (ref 135–145)

## 2013-01-20 MED ORDER — ENALAPRIL MALEATE 5 MG PO TABS
5.0000 mg | ORAL_TABLET | Freq: Two times a day (BID) | ORAL | Status: DC
  Administered 2013-01-20 – 2013-01-21 (×2): 5 mg via ORAL
  Filled 2013-01-20 (×3): qty 1

## 2013-01-20 NOTE — Progress Notes (Signed)
Internal Medicine Attending  Date: 01/20/2013  Patient name: [name] Medical record number: [id number] Date of birth: [birth year] Age: 32 y.o. Gender: female  I saw and evaluated the patient and discussed her care with house staff.  She reports definite improvement in her facial and eyelid swelling, and she can now see out of both eyes; she was working on her laptop computer when I entered the room.  Exam shows less edema and tenderness; no cardiac murmur.  Patient's cellulitis is definitely improving.  Plan is continue IV vancomycin and ceftriaxone; we are still awaiting the identification of the gram-positive cocci reported from her blood culture.

## 2013-01-20 NOTE — Progress Notes (Signed)
ANTIBIOTIC CONSULT NOTE - FOLLOW UP  Pharmacy Consult for Vancomycin Indication: Cellulitis/Bacteremia   Allergies  Allergen Reactions  . Tobramycin Sulfate Swelling    Patient Measurements: Height: 5\' 4"  (162.6 cm) Weight: 132 lb 11.5 oz (60.2 kg) IBW/kg (Calculated) : 54.7    Vital Signs: Temp: 98.5 F (36.9 C) (01/24 0512) Temp src: Oral (01/24 0512) BP: 122/87 mmHg (01/24 0512) Pulse Rate: 75  (01/24 0512) Intake/Output from previous day: 01/23 0701 - 01/24 0700 In: 1380 [P.O.:1180; IV Piggyback:200] Out: 450 [Urine:450] Intake/Output from this shift:    Labs:  Basename 01/20/13 0655 01/19/13 1723 01/19/13 0933 01/18/13 1700  WBC 5.3 -- 9.7 --  HGB 10.1* -- 9.9* --  PLT 282 -- 240 --  LABCREA -- 76.66 -- 87.18  CREATININE 1.81* -- 1.38* 1.38*   Estimated Creatinine Clearance: 39.6 ml/min (by C-G formula based on Cr of 1.81).  Basename 01/20/13 0655  VANCOTROUGH --  Corlis Leak --  VANCORANDOM 12.3  GENTTROUGH --  GENTPEAK --  GENTRANDOM --  TOBRATROUGH --  [place] --  AMIKACIN --     Microbiology: Recent Results (from the past 720 hour(s))  URINE CULTURE     Status: Normal   Collection Time   01/17/13 10:58 AM      Component Value Range Status Comment   Specimen Description URINE, RANDOM   Final    Special Requests NONE   Final    Culture  Setup Time 01/17/2013 12:27   Final    Colony Count NO GROWTH   Final    Culture NO GROWTH   Final    Report Status 01/18/2013 FINAL   Final   CULTURE, BLOOD (ROUTINE X 2)     Status: Normal (Preliminary result)   Collection Time   01/17/13  3:00 PM      Component Value Range Status Comment   Specimen Description BLOOD RIGHT ARM   Final    Special Requests BOTTLES DRAWN AEROBIC AND ANAEROBIC 10CC   Final    Culture  Setup Time 01/17/2013 21:08   Final    Culture     Final    Value: GRAM POSITIVE COCCI IN PAIRS     Note: Gram Stain Report Called to,Read Back  By and Verified With: Mckenzy TONEY 01/18/13 1110 BY SMITHERSJ   Report Status PENDING   Incomplete   CULTURE, BLOOD (ROUTINE X 2)     Status: Normal (Preliminary result)   Collection Time   01/17/13  3:20 PM      Component Value Range Status Comment   Specimen Description BLOOD RIGHT HAND   Final    Special Requests BOTTLES DRAWN AEROBIC AND ANAEROBIC 10CC   Final    Culture  Setup Time 01/17/2013 21:08   Final    Culture     Final    Value: GRAM POSITIVE COCCI IN PAIRS     Note: Gram Stain Report Called to,Read Back By and Verified With: Nelma TONEY 01/18/13 1110 BY SMITHERSJ   Report Status PENDING   Incomplete   GC/CHLAMYDIA PROBE AMP     Status: Normal   Collection Time   01/17/13  3:58 PM      Component Value Range Status Comment   CT Probe RNA NEGATIVE  NEGATIVE Final    GC Probe RNA NEGATIVE  NEGATIVE Final   CULTURE, BLOOD (ROUTINE X 2)     Status: Normal (Preliminary result)   Collection Time  01/19/13  3:45 PM      Component Value Range Status Comment   Specimen Description BLOOD RIGHT ARM   Final    Special Requests BOTTLES DRAWN AEROBIC AND ANAEROBIC 10CC LATERAL   Final    Culture  Setup Time 01/19/2013 23:37   Final    Culture     Final    Value:        BLOOD CULTURE RECEIVED NO GROWTH TO DATE CULTURE WILL BE HELD FOR 5 DAYS BEFORE ISSUING A FINAL NEGATIVE REPORT   Report Status PENDING   Incomplete     Anti-infectives     Start     Dose/Rate Route Frequency Ordered Stop   01/19/13 1430   metroNIDAZOLE (FLAGYL) tablet 2,000 mg        2,000 mg Oral  Once 01/19/13 1412 01/19/13 1458   01/19/13 0600   vancomycin (VANCOCIN) IVPB 1000 mg/200 mL premix        1,000 mg 200 mL/hr over 60 Minutes Intravenous Every 24 hours 01/18/13 0913     01/18/13 2200   metroNIDAZOLE (FLAGYL) tablet 500 mg  Status:  Discontinued        500 mg Oral Every 12 hours 01/18/13 1009 01/19/13 1412   01/17/13 2200   metroNIDAZOLE (FLAGYL) IVPB 500 mg  Status:  Discontinued     Comments:  Trichomoniasis      500 mg 100 mL/hr over 60 Minutes Intravenous Every 12 hours 01/17/13 1641 01/17/13 1827   01/17/13 2200   metroNIDAZOLE (FLAGYL) IVPB 500 mg  Status:  Discontinued     Comments: Trichomoniasis      500 mg 100 mL/hr over 60 Minutes Intravenous 3 times per day 01/17/13 1827 01/18/13 1009   01/17/13 2000   cefTRIAXone (ROCEPHIN) 1 g in dextrose 5 % 50 mL IVPB  Status:  Discontinued        1 g 100 mL/hr over 30 Minutes Intravenous Every 24 hours 01/17/13 1811 01/19/13 1412   01/17/13 1800   vancomycin (VANCOCIN) 750 mg in sodium chloride 0.9 % 150 mL IVPB  Status:  Discontinued        750 mg 150 mL/hr over 60 Minutes Intravenous Every 12 hours 01/17/13 1738 01/18/13 0913          Assessment: Patient is a 32 y/o female admitted to the hospital with facial swelling requiring empiric treatment with vancomycin and ceftriaxone. Blood cultures (2/2) have revealed gram positive cocci in pairs, suggesting bacteremia. Per ID, anti-biotic regimen was de-escalated to vancomycin only. Patient has fluctuating renal function, making dosing difficult. Scr today is 1.81 (up from 1.38 on 01/20/2012) with an estimated CrCl of 40 mL/min. To date, the patient has received 2 doses of vancomycin at 750mg  and 1 dose at 1000mg  prior this getting a vancomycin level this AM which was 12.3. This level is not a steady state, we just wanted to ensure we were not markedly under-dosing the patient given vancomycin is monotherapy at this point.   Goal of Therapy:  Vancomycin trough level 15-20 mcg/ml  Plan:  -Continue vancomycin 1000mg   IV every 24 hours  -Follow-up cultures and sensitivities  -Monitor renal function close, adjust dose as necessary  -Recheck level at steady state -F/U ID plans  Carmel [place] 01/20/2013,9:05 AM  I have reviewed and discussed the above patient with Wells Guiles and agree with her assessment and plan. We will continue the current dose given her decline in renal  function again today, as well as vancomycin level  is not at steady state currently. Trough needs to be checked before Sunday AM dose if renal function doesn't drastically change. Patient is having appropriate clinical response as MD reports decreased swelling this AM and WBC is markedly down at 5.3<9.7<19.9.  We will look to micro data for speciation and C&S to guide our treatment.   Thank you for the consult,   Narda Bonds, PharmD Clinical Pharmacist Phone: [phone] Pager: [phone] 01/20/2013 9:34 AM

## 2013-01-20 NOTE — Progress Notes (Signed)
INFECTIOUS DISEASE PROGRESS NOTE  ID: [name] is a 32 y.o. female with Principal Problem:  *Preseptal cellulitis Active Problems:  H/O pericarditis  Lupus (systemic lupus erythematosus)  History of lupus nephritis  S/P pericardiocentesis  H/O pleural effusion  Facial cellulitis  Nephrosis  Trichomoniasis  Cocaine abuse  Marijuana smoker  Subjective: Feels better  Abtx:  Anti-infectives     Start     Dose/Rate Route Frequency Ordered Stop   01/19/13 1430   metroNIDAZOLE (FLAGYL) tablet 2,000 mg        2,000 mg Oral  Once 01/19/13 1412 01/19/13 1458   01/19/13 0600   vancomycin (VANCOCIN) IVPB 1000 mg/200 mL premix        1,000 mg 200 mL/hr over 60 Minutes Intravenous Every 24 hours 01/18/13 0913     01/18/13 2200   metroNIDAZOLE (FLAGYL) tablet 500 mg  Status:  Discontinued        500 mg Oral Every 12 hours 01/18/13 1009 01/19/13 1412   01/17/13 2200   metroNIDAZOLE (FLAGYL) IVPB 500 mg  Status:  Discontinued     Comments: Trichomoniasis      500 mg 100 mL/hr over 60 Minutes Intravenous Every 12 hours 01/17/13 1641 01/17/13 1827   01/17/13 2200   metroNIDAZOLE (FLAGYL) IVPB 500 mg  Status:  Discontinued     Comments: Trichomoniasis      500 mg 100 mL/hr over 60 Minutes Intravenous 3 times per day 01/17/13 1827 01/18/13 1009   01/17/13 2000   cefTRIAXone (ROCEPHIN) 1 g in dextrose 5 % 50 mL IVPB  Status:  Discontinued        1 g 100 mL/hr over 30 Minutes Intravenous Every 24 hours 01/17/13 1811 01/19/13 1412   01/17/13 1800   vancomycin (VANCOCIN) 750 mg in sodium chloride 0.9 % 150 mL IVPB  Status:  Discontinued        750 mg 150 mL/hr over 60 Minutes Intravenous Every 12 hours 01/17/13 1738 01/18/13 0913          Medications:  Scheduled:   . enalapril  5 mg Oral BID  . heparin  5,000 Units Subcutaneous Q8H  . sodium bicarbonate  650 mg Oral BID  . sodium chloride  1,000 mL Intravenous Once  . vancomycin  1,000 mg Intravenous Q24H     Objective: Vital signs in last 24 hours: Temp:  [98.2 F (36.8 C)-98.5 F (36.9 C)] 98.2 F (36.8 C) (01/24 1346) Pulse Rate:  [71-98] 74  (01/24 1346) Resp:  [20] 20  (01/24 1346) BP: (113-130)/(71-91) 113/71 mmHg (01/24 1346) SpO2:  [92 %-99 %] 96 % (01/24 1346) Weight:  [60.2 kg (132 lb 11.5 oz)] 60.2 kg (132 lb 11.5 oz) (01/23 2043)   General appearance: alert, cooperative and no distress Eyes: able to open eyes 30-40% Neck: no adenopathy, supple, symmetrical, trachea midline and non-tender  Lab Results  [facility] 01/20/13 0655 01/19/13 0933  WBC 5.3 9.7  HGB 10.1* 9.9*  HCT 30.6* 29.0*  NA 136 137  K 4.3 3.9  CL 108 111  CO2 18* 18*  BUN 36* 32*  CREATININE 1.81* 1.38*  GLU -- --   Liver Panel No results found for this basename: PROT:2,ALBUMIN:2,AST:2,ALT:2,ALKPHOS:2,BILITOT:2,BILIDIR:2,IBILI:2 in the last 72 hours Sedimentation Rate No results found for this basename: ESRSEDRATE in the last 72 hours C-Reactive Protein No results found for this basename: CRP:2 in the last 72 hours  Microbiology: Recent Results (from the past 240 hour(s))  URINE CULTURE  Status: Normal   Collection Time   01/17/13 10:58 AM      Component Value Range Status Comment   Specimen Description URINE, RANDOM   Final    Special Requests NONE   Final    Culture  Setup Time 01/17/2013 12:27   Final    Colony Count NO GROWTH   Final    Culture NO GROWTH   Final    Report Status 01/18/2013 FINAL   Final   CULTURE, BLOOD (ROUTINE X 2)     Status: Normal (Preliminary result)   Collection Time   01/17/13  3:00 PM      Component Value Range Status Comment   Specimen Description BLOOD RIGHT ARM   Final    Special Requests BOTTLES DRAWN AEROBIC AND ANAEROBIC 10CC   Final    Culture  Setup Time 01/17/2013 21:08   Final    Culture     Final    Value: GRAM POSITIVE COCCI IN PAIRS     Note: Gram Stain Report Called to,Read Back By and Verified With: Sundi TONEY 01/18/13 1110 BY [email or web address]    Report Status PENDING   Incomplete   CULTURE, BLOOD (ROUTINE X 2)     Status: Normal (Preliminary result)   Collection Time   01/17/13  3:20 PM      Component Value Range Status Comment   Specimen Description BLOOD RIGHT HAND   Final    Special Requests BOTTLES DRAWN AEROBIC AND ANAEROBIC 10CC   Final    Culture  Setup Time 01/17/2013 21:08   Final    Culture     Final    Value: GRAM POSITIVE COCCI IN PAIRS     Note: Gram Stain Report Called to,Read Back By and Verified With: Kasidee TONEY 01/18/13 1110 BY [email or web address]   Report Status PENDING   Incomplete   GC/CHLAMYDIA PROBE AMP     Status: Normal   Collection Time   01/17/13  3:58 PM      Component Value Range Status Comment   CT Probe RNA NEGATIVE  NEGATIVE Final    GC Probe RNA NEGATIVE  NEGATIVE Final   CULTURE, BLOOD (ROUTINE X 2)     Status: Normal (Preliminary result)   Collection Time   01/19/13  3:45 PM      Component Value Range Status Comment   Specimen Description BLOOD RIGHT ARM   Final    Special Requests BOTTLES DRAWN AEROBIC AND ANAEROBIC 10CC LATERAL   Final    Culture  Setup Time 01/19/2013 23:37   Final    Culture     Final    Value:        BLOOD CULTURE RECEIVED NO GROWTH TO DATE CULTURE WILL BE HELD FOR 5 DAYS BEFORE ISSUING A FINAL NEGATIVE REPORT   Report Status PENDING   Incomplete     Studies/Results: No results found.   Assessment/Plan:  Facial/peri-orbital cellulitis  Poor dentition  Bacteremia- ID pending SLE  ARF Day 4 anbx (vanco)  Awaiting ID of her bacteremia. Would aim for 10-14 days of IV anbx, if shorter course could complete additional week of PO. Dr Megan Salon is available if questions over the weekend.    [name] Infectious Diseases [id number] 01/20/2013, 3:55 PM   LOS: 3 days

## 2013-01-20 NOTE — Progress Notes (Addendum)
Subjective: She reports feeling better today. Normal pain. No new complaints.  Objective: Vital signs in last 24 hours: Filed Vitals:   01/19/13 1726 01/19/13 2043 01/20/13 0512 01/20/13 1005  BP: 130/83 127/91 122/87 121/80  Pulse: 98 93 75 71  Temp: 98.2 F (36.8 C) 98.4 F (36.9 C) 98.5 F (36.9 C)   TempSrc: Oral Oral Oral   Resp: 20 20 20    Height:      Weight:  132 lb 11.5 oz (60.2 kg)    SpO2: 99% 92% 95%    Weight change: -7.1 oz (-0.2 kg)  Intake/Output Summary (Last 24 hours) at 01/20/13 1311 Last data filed at 01/20/13 [id number]  Gross per 24 hour  Intake    940 ml  Output    150 ml  Net    790 ml   General appearance: alert, cooperative, fatigued, mild distress and toxic Head: small bruises on left frontal area. Eyes: The periorbital edema has significantly reduced today. she has good vision in both eyes and she is able to open both eyes at least half way. here are normal EOM. The discharge from the eyes is less. PEARL Neck: no adenopathy, no carotid bruit, no JVD, supple, symmetrical, trachea midline and thyroid not enlarged, symmetric, no tenderness/mass/nodules Lungs: clear to auscultation bilaterally Heart: regular rate and rhythm, S1, S2 normal, no murmur, click, rub or gallop Abdomen: soft, non-tender; bowel sounds normal; no masses,  no organomegaly Extremities: extremities normal, atraumatic, no cyanosis or edema Pulses: 2+ and symmetric Neurologic: Alert and oriented X 3, normal strength and tone. Normal symmetric reflexes. Normal coordination and gait.    Date of picture is 01/19/2012   Date of picture 01/19/2013    Lab Results: Basic Metabolic Panel:  Lab [id number] 0655 01/19/13 0933  NA 136 137  K 4.3 3.9  CL 108 111  CO2 18* 18*  GLUCOSE 104* 69*  BUN 36* 32*  CREATININE 1.81* 1.38*  CALCIUM 8.0* 7.8*  MG -- --  PHOS -- --   Liver Function Tests:  Lab 01/17/13 1500  AST 10  ALT 5  ALKPHOS 45  BILITOT 0.2*  PROT 6.5  ALBUMIN 1.2*    CBC:  Lab 01/20/13 0655 01/19/13 0933 01/17/13 0920  WBC 5.3 9.7 --  NEUTROABS -- -- 16.0*  HGB 10.1* 9.9* --  HCT 30.6* 29.0* --  MCV 80.7 80.3 --  PLT 282 240 --   Fasting Lipid Panel:  Lab 01/17/13 1500  CHOL 103  HDL 30*  LDLCALC 52  TRIG 105  CHOLHDL 3.4  LDLDIRECT --    Drugs of Abuse     Component Value Date/Time   LABOPIA NONE DETECTED 01/17/2013 1058   COCAINSCRNUR POSITIVE* 01/17/2013 1058   LABBENZ NONE DETECTED 01/17/2013 1058   AMPHETMU NONE DETECTED 01/17/2013 1058   THCU POSITIVE* 01/17/2013 1058   LABBARB NONE DETECTED 01/17/2013 1058    Urinalysis:  Lab 01/17/13 1058  COLORURINE YELLOW  LABSPEC 1.017  PHURINE 5.5  GLUCOSEU NEGATIVE  HGBUR LARGE*  BILIRUBINUR NEGATIVE  KETONESUR NEGATIVE  PROTEINUR >300*  UROBILINOGEN 1.0  NITRITE NEGATIVE  LEUKOCYTESUR TRACE*   Misc. Labs:   Micro Results: Recent Results (from the past 240 hour(s))  URINE CULTURE     Status: Normal   Collection Time   01/17/13 10:58 AM      Component Value Range Status Comment   Specimen Description URINE, RANDOM   Final    Special Requests NONE   Final    Culture  Setup Time 01/17/2013 12:27   Final    Colony Count NO GROWTH   Final    Culture NO GROWTH   Final    Report Status 01/18/2013 FINAL   Final   CULTURE, BLOOD (ROUTINE X 2)     Status: Normal (Preliminary result)   Collection Time   01/17/13  3:00 PM      Component Value Range Status Comment   Specimen Description BLOOD RIGHT ARM   Final    Special Requests BOTTLES DRAWN AEROBIC AND ANAEROBIC 10CC   Final    Culture  Setup Time 01/17/2013 21:08   Final    Culture     Final    Value: GRAM POSITIVE COCCI IN PAIRS     Note: Gram Stain Report Called to,Read Back By and Verified With: Mistina TONEY 01/18/13 1110 BY [email or web address]   Report Status PENDING   Incomplete   CULTURE, BLOOD (ROUTINE X 2)     Status: Normal (Preliminary result)   Collection Time   01/17/13  3:20 PM      Component Value Range Status Comment    Specimen Description BLOOD RIGHT HAND   Final    Special Requests BOTTLES DRAWN AEROBIC AND ANAEROBIC 10CC   Final    Culture  Setup Time 01/17/2013 21:08   Final    Culture     Final    Value: GRAM POSITIVE COCCI IN PAIRS     Note: Gram Stain Report Called to,Read Back By and Verified With: Raniya TONEY 01/18/13 1110 BY [email or web address]   Report Status PENDING   Incomplete   GC/CHLAMYDIA PROBE AMP     Status: Normal   Collection Time   01/17/13  3:58 PM      Component Value Range Status Comment   CT Probe RNA NEGATIVE  NEGATIVE Final    GC Probe RNA NEGATIVE  NEGATIVE Final   CULTURE, BLOOD (ROUTINE X 2)     Status: Normal (Preliminary result)   Collection Time   01/19/13  3:45 PM      Component Value Range Status Comment   Specimen Description BLOOD RIGHT ARM   Final    Special Requests BOTTLES DRAWN AEROBIC AND ANAEROBIC 10CC LATERAL   Final    Culture  Setup Time 01/19/2013 23:37   Final    Culture     Final    Value:        BLOOD CULTURE RECEIVED NO GROWTH TO DATE CULTURE WILL BE HELD FOR 5 DAYS BEFORE ISSUING A FINAL NEGATIVE REPORT   Report Status PENDING   Incomplete    Studies/Results: No results found. Medications: I have reviewed the patient's current medications. Scheduled Meds:    . enalapril  5 mg Oral BID  . heparin  5,000 Units Subcutaneous Q8H  . sodium bicarbonate  650 mg Oral BID  . sodium chloride  1,000 mL Intravenous Once  . vancomycin  1,000 mg Intravenous Q24H   Continuous Infusions:  PRN Meds:.acetaminophen, acetaminophen, ondansetron (ZOFRAN) IV, ondansetron Assessment/Plan:  This is a 32 year old woman, with a past medical history of lupus, who presents with facial pain and swelling for 2 days.   Preseptal/Facial cellulitis: Ms Widing' presentation with a history of increasing pain and swelling involving to the face was very concerning for preseptal/facial cellulitis. She also endorsed fevers and chills. Examination revealed small marks of previous trauma,  moderate swelling, and erythema surrounding the frontal head, bilateral orbital swelling, and signs of infection surrounding her nasal bridge. Her temperature  in the ED, was 101.8 The CBC revealed a white blood cell count of 18.4 but this improved with treatment with Vanc and Ceftriaxone. S CT scan reports infectious process with chronic and acute sinusitis. Other possibilities like allergies were unlikely in the setting of a clear history of trauma over the face, preceding the pain and swelling. Her face looks better today with the swelling going down.  Plan   - Vanc and ceftriaxone. This is day 4 of treatment  -we will trend the CBC daily,  but clinically better -talked with Dr Johnnye Sima about MRI of head to rule out carvenous sinus thrombosis, and agrees to hold since she is clinically improving. -Dental consult not done - Dr Enrique Sack out of town till next week  Bacteremia: Blood cultures have revealed gram positive cocci in pairs. Sensitivity is pending. The infection is most likely is resulting from the facial infection above. No murmur heard to suggest Infective endocarditis. Echo on 01/18/2013- Normal LV size and systolic function, EF 0000000. Normal RV size and systolic function. No significant valvularabnormalities. Mild LAE. Microbiology lab will have full results tomorrow but reported that the organism is most likely S.pneumonia Plan  - continue with the abx above as this is most likely streptococcal which is usually sensitive to Vanc or Ceftriaxone -Repeat Blood Cx - no growth today  Lupus nephritis: Patient reports a remote history of lupus nephritis. He had a biopsy done in 2006, which confirmed this diagnosis. However, she reports, that she has not been following up with the nephrologist. She has a history of pericardial effusion, which was drained by Dr. Johnsie Cancel about 2 years ago. She reports that she was on mycophenolate, but she has been off for over one year. Her urine exam reveals  proteinuria of more than 300 with hyaline casts. However, on physical examination, she does not have edema and I do not suspect nephrotic syndrome to account for facial swelling. Creatinine is 2.18. Her baseline symptoms between 1.6-1.2. Repeat renal ultrasound performed today reveals Slightly echogenic renal pyramids, suspicious for chronic Glomerulonephritis. The previous one performed in 2006 revealed enlarged bilateral kidneys or bilateral nephromegaly. She also reports that she has been using Ibuprofen which can cause AKI. Double-stranded DNA antibodies are positive. ANA positive as well. Protein creatinine ratio is 7.4. Total protein in 24-hour urine is 569.6. Her creatinine level has remained elevated at 1.81. Plan  - appreciate renal input  -started on sodium bicarb - She'll need close followup with a nephrologist upon discharge. ufficiency secondary to acute infection.   Trichomonas vaginalis infection: On urine exam she patient was found to have Trichomonas vaginal infection. She denies symptoms of vaginal itching. This was treated with metronidazole and the treatment was completed. A viral hepatitis and HIV screening was negative.   Drug abuse: UDS positive for cocaine and marijuana. She will need CSW for counseling about abuse.  Dispo: Disposition is deferred at this time, awaiting improvement of current medical problems.  She will most likely be discharged in the next 2 days. The patient does not have a current PCP (Default, Provider, MD), therefore will be requiring OPC follow-up after discharge.  The patient does not have transportation limitations that hinder transportation to clinic appointments.    LOS: 3 days   Jessee Avers 01/20/2013, 1:11 PM

## 2013-01-21 DIAGNOSIS — N049 Nephrotic syndrome with unspecified morphologic changes: Secondary | ICD-10-CM

## 2013-01-21 DIAGNOSIS — F1410 Cocaine abuse, uncomplicated: Secondary | ICD-10-CM

## 2013-01-21 LAB — CULTURE, BLOOD (ROUTINE X 2)

## 2013-01-21 LAB — CBC
Hemoglobin: 9 g/dL — ABNORMAL LOW (ref 12.0–15.0)
MCH: 26.9 pg (ref 26.0–34.0)
Platelets: 297 10*3/uL (ref 150–400)
RBC: 3.35 MIL/uL — ABNORMAL LOW (ref 3.87–5.11)
WBC: 8 10*3/uL (ref 4.0–10.5)

## 2013-01-21 LAB — BASIC METABOLIC PANEL
CO2: 17 mEq/L — ABNORMAL LOW (ref 19–32)
Calcium: 7.9 mg/dL — ABNORMAL LOW (ref 8.4–10.5)
Chloride: 111 mEq/L (ref 96–112)
Glucose, Bld: 81 mg/dL (ref 70–99)
Potassium: 3.8 mEq/L (ref 3.5–5.1)
Sodium: 137 mEq/L (ref 135–145)

## 2013-01-21 MED ORDER — SODIUM BICARBONATE 650 MG PO TABS: 650.0000 mg | ORAL_TABLET | Freq: Two times a day (BID) | ORAL | Status: DC

## 2013-01-21 MED ORDER — AMOXICILLIN 500 MG PO CAPS: 500.0000 mg | ORAL_CAPSULE | Freq: Three times a day (TID) | ORAL | Status: AC

## 2013-01-21 MED ORDER — ACETAMINOPHEN 500 MG PO TABS: 500.0000 mg | ORAL_TABLET | Freq: Four times a day (QID) | ORAL | Status: DC | PRN

## 2013-01-21 MED ORDER — ENALAPRIL MALEATE 5 MG PO TABS: 5.0000 mg | ORAL_TABLET | Freq: Two times a day (BID) | ORAL | Status: DC

## 2013-01-21 NOTE — Progress Notes (Signed)
Subjective: She reports feeling better today. Normal pain. No new complaints. No overnight events. Objective: Vital signs in last 24 hours: Filed Vitals:   01/20/13 1346 01/20/13 1740 01/20/13 2107 01/21/13 0458  BP: 113/71 135/95 130/94 121/85  Pulse: 74 84 69 91  Temp: 98.2 F (36.8 C) 98.6 F (37 C) 97.8 F (36.6 C) 97.7 F (36.5 C)  TempSrc: Oral Oral Oral Oral  Resp: 20 20 20 20   Height:      Weight:   130 lb 14.4 oz (59.376 kg)   SpO2: 96% 98% 99% 98%   Weight change: -1 lb 13.1 oz (-0.824 kg)  Intake/Output Summary (Last 24 hours) at 01/21/13 0936 Last data filed at 01/21/13 0840  Gross per 24 hour  Intake    460 ml  Output      0 ml  Net    460 ml   General appearance: alert, cooperative, fatigued, mild distress and toxic Head: small bruises on left frontal area are much healed today. Eyes: The periorbital edema has significantly further reduced today. She has good vision in both eyes and she is able to open both eyes more than half way. normal EOM. The discharge from the eyes is almost none. PEARL Neck: no adenopathy, no carotid bruit, no JVD, supple, symmetrical, trachea midline and thyroid not enlarged, symmetric, no tenderness/mass/nodules Lungs: clear to auscultation bilaterally Heart: regular rate and rhythm, S1, S2 normal, no murmur, click, rub or gallop Abdomen: soft, non-tender; bowel sounds normal; no masses,  no organomegaly Extremities: extremities normal, atraumatic, no cyanosis or edema Pulses: 2+ and symmetric Neurologic: Alert and oriented X 3, normal strength and tone. Normal symmetric reflexes. Normal coordination and gait.   Lab Results: Basic Metabolic Panel:  Lab [id number] 0615 01/20/13 0655  NA 137 136  K 3.8 4.3  CL 111 108  CO2 17* 18*  GLUCOSE 81 104*  BUN 40* 36*  CREATININE 2.09* 1.81*  CALCIUM 7.9* 8.0*  MG -- --  PHOS -- --   Liver Function Tests:  Lab 01/17/13 1500  AST 10  ALT 5  ALKPHOS 45  BILITOT 0.2*  PROT 6.5    ALBUMIN 1.2*   CBC:  Lab 01/21/13 0615 01/20/13 0655 01/17/13 0920  WBC 8.0 5.3 --  NEUTROABS -- -- 16.0*  HGB 9.0* 10.1* --  HCT 27.0* 30.6* --  MCV 80.6 80.7 --  PLT 297 282 --   Fasting Lipid Panel:  Lab 01/17/13 1500  CHOL 103  HDL 30*  LDLCALC 52  TRIG 105  CHOLHDL 3.4  LDLDIRECT --    Drugs of Abuse     Component Value Date/Time   LABOPIA NONE DETECTED 01/17/2013 1058   COCAINSCRNUR POSITIVE* 01/17/2013 1058   LABBENZ NONE DETECTED 01/17/2013 1058   AMPHETMU NONE DETECTED 01/17/2013 1058   THCU POSITIVE* 01/17/2013 1058   LABBARB NONE DETECTED 01/17/2013 1058    Urinalysis:  Lab 01/17/13 1058  COLORURINE YELLOW  LABSPEC 1.017  PHURINE 5.5  GLUCOSEU NEGATIVE  HGBUR LARGE*  BILIRUBINUR NEGATIVE  KETONESUR NEGATIVE  PROTEINUR >300*  UROBILINOGEN 1.0  NITRITE NEGATIVE  LEUKOCYTESUR TRACE*   Misc. Labs:   Micro Results: Recent Results (from the past 240 hour(s))  URINE CULTURE     Status: Normal   Collection Time   01/17/13 10:58 AM      Component Value Range Status Comment   Specimen Description URINE, RANDOM   Final    Special Requests NONE   Final    Culture  Setup Time 01/17/2013 12:27   Final    Colony Count NO GROWTH   Final    Culture NO GROWTH   Final    Report Status 01/18/2013 FINAL   Final   CULTURE, BLOOD (ROUTINE X 2)     Status: Normal (Preliminary result)   Collection Time   01/17/13  3:00 PM      Component Value Range Status Comment   Specimen Description BLOOD RIGHT ARM   Final    Special Requests BOTTLES DRAWN AEROBIC AND ANAEROBIC 10CC   Final    Culture  Setup Time 01/17/2013 21:08   Final    Culture     Final    Value: GRAM POSITIVE COCCI IN PAIRS     Note: Gram Stain Report Called to,Read Back By and Verified With: Shakeira TONEY 01/18/13 1110 BY [email or web address]   Report Status PENDING   Incomplete   CULTURE, BLOOD (ROUTINE X 2)     Status: Normal (Preliminary result)   Collection Time   01/17/13  3:20 PM      Component Value Range  Status Comment   Specimen Description BLOOD RIGHT HAND   Final    Special Requests BOTTLES DRAWN AEROBIC AND ANAEROBIC 10CC   Final    Culture  Setup Time 01/17/2013 21:08   Final    Culture     Final    Value: GRAM POSITIVE COCCI IN PAIRS     Note: Gram Stain Report Called to,Read Back By and Verified With: Cinderella TONEY 01/18/13 1110 BY [email or web address]   Report Status PENDING   Incomplete   GC/CHLAMYDIA PROBE AMP     Status: Normal   Collection Time   01/17/13  3:58 PM      Component Value Range Status Comment   CT Probe RNA NEGATIVE  NEGATIVE Final    GC Probe RNA NEGATIVE  NEGATIVE Final   CULTURE, BLOOD (ROUTINE X 2)     Status: Normal (Preliminary result)   Collection Time   01/19/13  3:45 PM      Component Value Range Status Comment   Specimen Description BLOOD RIGHT ARM   Final    Special Requests BOTTLES DRAWN AEROBIC AND ANAEROBIC 10CC LATERAL   Final    Culture  Setup Time 01/19/2013 23:37   Final    Culture     Final    Value:        BLOOD CULTURE RECEIVED NO GROWTH TO DATE CULTURE WILL BE HELD FOR 5 DAYS BEFORE ISSUING A FINAL NEGATIVE REPORT   Report Status PENDING   Incomplete    Studies/Results: No results found. Medications: I have reviewed the patient's current medications. Scheduled Meds:    . enalapril  5 mg Oral BID  . heparin  5,000 Units Subcutaneous Q8H  . sodium bicarbonate  650 mg Oral BID  . sodium chloride  1,000 mL Intravenous Once  . vancomycin  1,000 mg Intravenous Q24H   Continuous Infusions:  PRN Meds:.acetaminophen, acetaminophen, ondansetron (ZOFRAN) IV, ondansetron Assessment/Plan:  This is a 32 year old woman, with a past medical history of lupus, who presents with facial pain and swelling for 2 days.   Preseptal/Facial cellulitis: Ms Dimoff' presentation with a history of increasing pain and swelling involving to the face was very concerning for preseptal/facial cellulitis. She also endorsed fevers and chills. Examination revealed small marks of  previous trauma, moderate swelling, and erythema surrounding the frontal head, bilateral orbital swelling, and signs of infection surrounding her nasal bridge. Her temperature  in the ED, was 101.8. The CBC revealed a white blood cell count of 18.4 but this. Head CT scan reported infectious process with chronic and acute sinusitis. Other possibilities like allergies were unlikely in the setting of a clear history of trauma over the face, preceding the pain and swelling. She has remained afebrile since admission.  Plan  - Vanc this is day 5 of treatment  - we will trend the CBC daily,  but clinically better - Awaiting ID of the organism before deciding on outpatient abx options  Bacteremia: Blood cultures have revealed gram positive cocci in pairs. Sensitivity is pending. The infection is most likely is resulting from the facial infection above. No murmur heard to suggest Infective endocarditis. Echo on 01/18/2013- Normal LV size and systolic function, EF 0000000. Normal RV size and systolic function. No significant valvularabnormalities. Mild LAE. Microbiology lab will have full results tomorrow but reported that the organism is most likely S.pneumonia. Plan  - continue with the abx Vanc. 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 He had a biopsy done in 2006, which confirmed this diagnosis. However, she reports, that she has not been following up with the nephrologist. She has a history of pericardial effusion, which was drained by Dr. Johnsie Cancel about 2 years ago. She reports that she was on mycophenolate, but she has been off for over one year. Her urine exam reveals proteinuria of more than 300 with hyaline casts. However, on physical examination, she does not have edema and I do not suspect nephrotic syndrome to account for facial swelling. Creatinine is 2.18. Her baseline symptoms between 1.6-1.2. Repeat renal ultrasound  performed today reveals. Slightly echogenic renal pyramids, suspicious for chronic Glomerulonephritis. The previous one performed in 2006 revealed enlarged bilateral kidneys or bilateral nephromegaly. She also reports that she has been using Ibuprofen which can cause AKI. Double-stranded DNA antibodies are positive. ANA positive as well. Protein creatinine ratio is 7.4. Total protein in 24-hour urine is 569.6. Her creatinine level has remained elevated at 1.81.  Plan  - appreciate renal input  -started on sodium bicarb - F/u with Dr Marval Regal as outpatient. - Continue with ACEI  Trichomonas vaginalis infection: On urine exam she patient was found to have Trichomonas vaginal infection. She denies symptoms of vaginal itching. This was treated with metronidazole and the treatment was completed. A viral hepatitis and HIV screening was negative.   Drug abuse: UDS positive for cocaine and marijuana. She will need CSW for counseling about abuse.  Dispo: Disposition is deferred at this time, awaiting improvement of current medical problems.  She will most likely be discharged in the next 2 days. The patient does not have a current PCP (Default, Provider, MD), therefore will be requiring OPC follow-up after discharge.  The patient does not have transportation limitations that hinder transportation to clinic appointments.    LOS: 4 days   Jessee Avers 01/21/2013, 9:36 AM

## 2013-01-21 NOTE — Discharge Summary (Signed)
Internal [facility] Discharge Note  Name: MANDRA ALDERETTE MRN: [id number] DOB: [birth year] 32 y.o.  Date of Admission: 01/17/2013  9:00 AM Date of Discharge: 01/23/2013 Attending Physician: Bertha Stakes, MD  Discharge Diagnosis: Principal Problem:  *Preseptal cellulitis Active Problems:  H/O pericarditis  Lupus (systemic lupus erythematosus)  History of lupus nephritis  S/P pericardiocentesis  H/O pleural effusion  Facial cellulitis  Nephrosis  Trichomoniasis  Cocaine abuse  Marijuana smoker  Streptococcal bacteremia   Discharge Medications:   Medication List     As of 01/23/2013  3:09 PM    STOP taking these medications         ibuprofen 200 MG tablet   Commonly known as: ADVIL,MOTRIN      TAKE these medications         acetaminophen 500 MG tablet   Commonly known as: TYLENOL   Take 1 tablet (500 mg total) by mouth every 6 (six) hours as needed for pain.      amoxicillin 500 MG capsule   Commonly known as: AMOXIL   Take 1 capsule (500 mg total) by mouth 3 (three) times daily.      enalapril 5 MG tablet   Commonly known as: VASOTEC   Take 1 tablet (5 mg total) by mouth 2 (two) times daily.      sodium bicarbonate 650 MG tablet   Take 1 tablet (650 mg total) by mouth 2 (two) times daily.          Disposition and follow-up:   Ms.Kyriana N Moulton was discharged from [facility] in stable condition.    At the hospital follow up visit please address  - Please evaluate the patient for preseptal cellulitis  - Pleas check her compliance with her Amoxillicin - Please repeat her CBC if need for leukocytosis - Please repeat BMET for renal function on ACEIs - Please emphasize to the patient to follow up with her appointments with renal and rheumatology   Follow-up Appointments: Follow-up Information    Follow up with Marijean Bravo, MD. On 02/09/2013. (arrive at 1:30pm)    Contact information:    Rheumatology [address] [phone]       Follow up with COLADONATO,JOSEPH A, MD. On 02/09/2013. (arrive @ 3:45pm)    Contact information:   [address] [place] [address] [phone]       Follow up with Santa Lighter, MD. On 01/25/2013. (9:15am)    Contact information:   [facility]  Ground Floor [address] [place] [address] [phone]         Discharge Orders    Future Appointments: Provider: Department: Dept Phone: Center:   01/25/2013 9:15 AM Neta Ehlers, MD [facility] [phone] [facility]     Future Orders Please Complete By Expires   Diet - low sodium heart healthy      Increase activity slowly      Call MD for:  temperature >100.4      Call MD for:  persistant nausea and vomiting      Call MD for:  severe uncontrolled pain      Call MD for:  difficulty breathing, headache or visual disturbances      Call MD for:  extreme fatigue      Call MD for:  persistant dizziness or light-headedness         Consultations:  ID  Procedures Performed:  Dg Chest 2 View  01/17/2013  *RADIOLOGY REPORT*  Clinical Data: Tachycardia  CHEST - 2 VIEW  Comparison: March 09, 2011  Findings:  Lungs clear.  Heart size and pulmonary vascularity are normal.  No adenopathy.  No bone lesions.  IMPRESSION: No abnormality noted.   Original Report Authenticated By: Lowella Grip, M.D.    US Renal  01/17/2013  *RADIOLOGY REPORT*  Clinical Data: Lupus.  Nephrotic syndrome.  RENAL/URINARY TRACT ULTRASOUND COMPLETE  Comparison:  12/27/2005  Findings:  Right Kidney:  Mildly echogenic renal pyramids.  13.2 cm in length. No renal stones, mass, or hydronephrosis.  Left Kidney:  Mildly hyperechoic pyramids, as on the contralateral side.  13.2 cm in length.  Otherwise negative.  Bladder:  Empty during exam.  IMPRESSION: 1.  Slightly echogenic renal pyramids,  suspicious for chronic glomerulonephritis.   Original Report Authenticated By: Van Clines, M.D.    Ct Maxillofacial Wo Cm  01/17/2013  *RADIOLOGY REPORT*  Clinical Data: Injury to the head with swelling in the eyes, fever.  CT MAXILLOFACIAL WITHOUT CONTRAST  Technique:  Multidetector CT imaging of the maxillofacial structures was performed. Multiplanar CT image reconstructions were also generated.  Comparison: None.  Findings: Forehead and periorbital soft tissue swelling tracks down into the subcutaneous tissues anterior to the maxilla bilaterally. Process appears bilaterally symmetric.  No postseptal extension into the orbits is identified.  The globes appear intact.  No fracture observed.  There is chronic right frontal and chronic right ethmoid sinusitis.  Mild chronic sphenoid sinusitis.  Cavity and periapical abscess associated with tooth #15.  Cavities of teeth number 29, 30, and 31, with associated periapical abscesses.  Ectopic tooth embedded in the right mandible as shown on image 18 of series 2, with a possible channel extending down to this ectopic tooth on image 26 of series 401.  IMPRESSION:  1.  Considerable soft tissue swelling tracking in the forehead subcutaneous tissues down into the periorbital regions and upper cheek regions, without underlying fracture. No intraorbital extension.  Infectious process such as cellulitis is not excluded. 2.  Chronic right paranasal sinusitis. 3.  Cavities and periapical abscesses in several teeth as detailed above. 4.  Ectopic tooth imbedded in the right mandible, with a possible channel extending to one of the decayed teeth in this vicinity. Dental referral recommended.   Original Report Authenticated By: Van Clines, M.D.      Admission HPI:  This is a 33 year old woman, with a past medical history of lupus, who presents with facial pain and swelling for 2 days. The patient reports history of trauma prior to onset of the pain. She hit her  head against the car door accidentally on 1/17. She used ibuprofen to treat the pain following the trauma but progressively her face became swelling and very painful starting one day prior to presentation to the ED. She also reports history of fevers and chills associated with this facial pain mainly involving the nose, and both eyes. She reports increased discharge from her eyes. She denies insect bites or close contact with sick people. She does not have a pet in her house. The pain is constant, dull and very severe to a scale of 8-10/10. There are no relieving or aggravating factors she has noticed. She denies history of shortness of breath. She denies sore throat or epistaxis. However, she has increasingly purulent discharge from her eyes bilaterally.  Review of Systems:  Constitutional: positive for anorexia, chills, fatigue, fevers, malaise, sweats and weight  loss  Eyes: negative for cataracts, color blindness and glaucoma, positive for irritation, redness and no photophobia  Ears, nose, mouth, throat, and face: positive for nasal congestion, negative for ear drainage, earaches, hearing loss, hoarseness, sore mouth, sore throat, tinnitus and voice change, all started after trauma  Respiratory: negative  Cardiovascular: negative  Gastrointestinal: positive for vomiting  Genitourinary:negative  Musculoskeletal:negative  Neurological: negative for dizziness, gait problems, seizures, speech problems and vertigo  Allergic/Immunologic: negative  Physical Exam:  Blood pressure 121/74, pulse 113, temperature 101.8 F (38.8 C), temperature source Oral, resp. rate 20, last menstrual period 01/17/2013, SpO2 100.00%.  General appearance: alert, cooperative, flushed, moderate distress, toxic and in pain with both eyes covered and appears to be in pain.  Head: Normocephalic, without obvious abnormality, atraumatic, small areas on bruising over the frontal head - small less than 0.5cm in diameter at the  right side.  Eyes: positive findings: eyelids/periorbital: swollen bilaterally with discharge of thin fluid. She also has erythema, involvinng the nose and frotal head. No areas of fractuancy, no localized open wounds.  Ears: normal TM's and external ear canals both ears  Nose: Nares normal. Septum midline. Mucosa normal. No drainage or sinus tenderness., erythema and tenderness over the nose bridge with some erythema.  Throat: lips, mucosa, and tongue normal; teeth and gums normal  Neck: no adenopathy, no carotid bruit, no JVD, supple, symmetrical, trachea midline, thyroid not enlarged, symmetric, no tenderness/mass/nodules and no neck stiffness.  Back: symmetric, no curvature. ROM normal. No CVA tenderness.  Lungs: clear to auscultation bilaterally  Heart: regular rate and rhythm, S1, S2 normal, no murmur, click, rub or gallop  Abdomen: soft, non-tender; bowel sounds normal; no masses, no organomegaly  Extremities: extremities normal, atraumatic, no cyanosis or edema  Pulses: 2+ and symmetric  Skin: Skin color, texture, turgor normal. No rashes or lesions  Neurologic: Alert and oriented X 3, normal strength and tone. Normal symmetric reflexes. Normal coordination and gait   Hospital Course by problem list:  Preseptal/Facial cellulitis: Ms Tippery' presentation with a history of increasing pain and swelling involving to the face was very concerning for preseptal/facial cellulitis. She also endorsed fevers and chills. Examination revealed small marks of previous trauma, moderate swelling, and erythema surrounding the frontal head, bilateral orbital swelling, and signs of infection surrounding her nasal bridge. Her temperature in the ED, was 101.8. The CBC revealed a white blood cell count of 18.4 but this resolved after 2 days with antibiotics. Head CT scan reported infectious process with chronic and acute sinusitis. Other possibilities like allergies were unlikely in the setting of a clear history  of trauma over the face, preceding the pain and swelling. She has remained afebrile since admission and her facial swelling was significant better by at least 80% compared to her initial presentation. She could open her eyes almost fully without difficulties and her initial discharged had resolved.  She completed 5 days of of IV antibiotics treatment before discharge. She was discharged on Amoxicillin 500 mg tid for 5 days with a follow up appointment in our outpatient clinic on the 01/25/2013.  Streptococcal Pneumoniae Bacteremia: Her blood cultures revealed gram positive cocci in pairs. Sensitivity came back the day of discharged and showed S.Pneumoniae which was sensitive to Levaquin, Penicillin, and Ceftriaxone. This infection was most likely resulting from the facial infection above. No murmur heard to suggest Infective endocarditis. Echo on 01/18/2013- Normal LV size and systolic function, EF 0000000. Normal RV size and systolic function. No significant valvularabnormalities. Mild  LAE. Repeat blood culture on the 01/19/2013 after two days of vancomycin and ceftriaxone were negative. We consulted Dr Megan Salon of Infectious Diseases who recommended Amoxillin 500 mg tid for 5 days. He was confident that this will be adequate treatment given her improvement and also based on sensitivity of the organism. She will be evaluated in the outpatient clinic in a few days at which time we will check her CBC.   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 He had a biopsy done in 2006, which confirmed this diagnosis. However, she reports, that she has not been following up with the nephrologist. She has a history of pericardial effusion, which was drained by Dr. Johnsie Cancel about 2 years ago. She reports that she was on mycophenolate, but she has been off for over one year. Her urine exam reveals proteinuria of more than 300 with hyaline casts. However, on physical examination, she does not have edema and  I do not suspect nephrotic syndrome to account for facial swelling. Creatinine is 2.18. Her baseline symptoms between 1.6-1.2. Repeat renal ultrasound performed today reveals. Slightly echogenic renal pyramids, suspicious for chronic Glomerulonephritis. The previous one performed in 2006 revealed enlarged bilateral kidneys or bilateral nephromegaly. She also reports that she has been using Ibuprofen which can cause AKI. Double-stranded DNA antibodies are positive. ANA positive as well. Protein creatinine ratio is 7.4. Total protein in 24-hour urine is 569.6. Her creatinine level remained elevated at 1.81. She was discharged on Enalapril and he BMET will be checked on outpatient follow up at the internal medicine clinic. She will follow up with Dr Marval Regal of nephrology and Dr Charlestine Night of rheumatology as outpatient.   Trichomonas vaginalis infection: On urine exam she patient was found to have Trichomonas vaginal infection. She denies symptoms of vaginal itching. This was treated with metronidazole and the treatment was completed. A viral hepatitis and HIV screening was negative  Drug abuse: UDS positive for cocaine and marijuana. She was strongly advised to stop using cocaine.    Discharge Vitals:  BP 128/86  Pulse 76  Temp 98 F (36.7 C) (Oral)  Resp 18  Ht 5\' 4"  (1.626 m)  Wt 130 lb 14.4 oz (59.376 kg)  BMI 22.47 kg/m2  SpO2 97%  LMP 01/17/2013  Discharge Labs:  No results found for this or any previous visit (from the past 24 hour(s)).  Signed: Jessee Avers 01/23/2013, 3:09 PM   Time Spent on Discharge: 45 minutes  Services Ordered on Discharge: None Equipment Ordered on Discharge: none

## 2013-01-21 NOTE — Progress Notes (Signed)
Patient given discharge instructions, IV d/c'd, awaiting ride. Upon returning all belongs gone and patient gone.

## 2013-01-23 DIAGNOSIS — B955 Unspecified streptococcus as the cause of diseases classified elsewhere: Secondary | ICD-10-CM

## 2013-01-23 DIAGNOSIS — R7881 Bacteremia: Secondary | ICD-10-CM | POA: Diagnosis present

## 2013-01-23 HISTORY — DX: Unspecified streptococcus as the cause of diseases classified elsewhere: B95.5

## 2013-01-23 HISTORY — DX: Unspecified streptococcus as the cause of diseases classified elsewhere: R78.81

## 2013-01-25 ENCOUNTER — Ambulatory Visit: Payer: 59 | Admitting: Internal Medicine

## 2013-01-25 LAB — CULTURE, BLOOD (ROUTINE X 2): Culture: NO GROWTH

## 2013-08-31 ENCOUNTER — Encounter (HOSPITAL_COMMUNITY): Payer: Self-pay | Admitting: Emergency Medicine

## 2013-08-31 ENCOUNTER — Observation Stay (HOSPITAL_COMMUNITY)
Admission: EM | Admit: 2013-08-31 | Discharge: 2013-09-01 | Disposition: A | Discharge status: Home / Self Care | Payer: Medicaid Other | Attending: Family Medicine | Admitting: Family Medicine

## 2013-08-31 DIAGNOSIS — B955 Unspecified streptococcus as the cause of diseases classified elsewhere: Secondary | ICD-10-CM

## 2013-08-31 DIAGNOSIS — N059 Unspecified nephritic syndrome with unspecified morphologic changes: Secondary | ICD-10-CM | POA: Insufficient documentation

## 2013-08-31 DIAGNOSIS — A599 Trichomoniasis, unspecified: Secondary | ICD-10-CM

## 2013-08-31 DIAGNOSIS — Z8709 Personal history of other diseases of the respiratory system: Secondary | ICD-10-CM

## 2013-08-31 DIAGNOSIS — M329 Systemic lupus erythematosus, unspecified: Principal | ICD-10-CM | POA: Diagnosis present

## 2013-08-31 DIAGNOSIS — I7300 Raynaud's syndrome without gangrene: Secondary | ICD-10-CM | POA: Insufficient documentation

## 2013-08-31 DIAGNOSIS — Z9119 Patient's noncompliance with other medical treatment and regimen: Secondary | ICD-10-CM | POA: Insufficient documentation

## 2013-08-31 DIAGNOSIS — L03211 Cellulitis of face: Secondary | ICD-10-CM

## 2013-08-31 DIAGNOSIS — F1290 Cannabis use, unspecified, uncomplicated: Secondary | ICD-10-CM

## 2013-08-31 DIAGNOSIS — F1410 Cocaine abuse, uncomplicated: Secondary | ICD-10-CM

## 2013-08-31 DIAGNOSIS — R9431 Abnormal electrocardiogram [ECG] [EKG]: Secondary | ICD-10-CM | POA: Insufficient documentation

## 2013-08-31 DIAGNOSIS — Z9889 Other specified postprocedural states: Secondary | ICD-10-CM

## 2013-08-31 DIAGNOSIS — Z91199 Patient's noncompliance with other medical treatment and regimen due to unspecified reason: Secondary | ICD-10-CM | POA: Insufficient documentation

## 2013-08-31 DIAGNOSIS — R0602 Shortness of breath: Secondary | ICD-10-CM | POA: Insufficient documentation

## 2013-08-31 DIAGNOSIS — Z87448 Personal history of other diseases of urinary system: Secondary | ICD-10-CM

## 2013-08-31 DIAGNOSIS — Z8679 Personal history of other diseases of the circulatory system: Secondary | ICD-10-CM

## 2013-08-31 DIAGNOSIS — R0789 Other chest pain: Secondary | ICD-10-CM | POA: Insufficient documentation

## 2013-08-31 DIAGNOSIS — N049 Nephrotic syndrome with unspecified morphologic changes: Secondary | ICD-10-CM

## 2013-08-31 LAB — POCT PREGNANCY, URINE: Preg Test, Ur: NEGATIVE

## 2013-08-31 LAB — URINALYSIS, ROUTINE W REFLEX MICROSCOPIC
Glucose, UA: NEGATIVE mg/dL
Leukocytes, UA: NEGATIVE
Nitrite: NEGATIVE
Specific Gravity, Urine: 1.016 (ref 1.005–1.030)
pH: 5.5 (ref 5.0–8.0)

## 2013-08-31 LAB — CBC WITH DIFFERENTIAL/PLATELET
Eosinophils Relative: 1 % (ref 0–5)
Hemoglobin: 10 g/dL — ABNORMAL LOW (ref 12.0–15.0)
Lymphocytes Relative: 26 % (ref 12–46)
Lymphs Abs: 1.3 10*3/uL (ref 0.7–4.0)
MCH: 26.5 pg (ref 26.0–34.0)
MCV: 82 fL (ref 78.0–100.0)
Monocytes Relative: 10 % (ref 3–12)
Platelets: 296 10*3/uL (ref 150–400)
RBC: 3.78 MIL/uL — ABNORMAL LOW (ref 3.87–5.11)
WBC: 5.1 10*3/uL (ref 4.0–10.5)

## 2013-08-31 LAB — POCT I-STAT TROPONIN I: Troponin i, poc: 0.03 ng/mL (ref 0.00–0.08)

## 2013-08-31 LAB — BASIC METABOLIC PANEL
BUN: 32 mg/dL — ABNORMAL HIGH (ref 6–23)
CO2: 17 mEq/L — ABNORMAL LOW (ref 19–32)
Glucose, Bld: 67 mg/dL — ABNORMAL LOW (ref 70–99)
Potassium: 4.6 mEq/L (ref 3.5–5.1)
Sodium: 135 mEq/L (ref 135–145)

## 2013-08-31 LAB — URINE MICROSCOPIC-ADD ON

## 2013-08-31 LAB — SEDIMENTATION RATE: Sed Rate: 108 mm/hr — ABNORMAL HIGH (ref 0–22)

## 2013-08-31 MED ORDER — HYDROCODONE-ACETAMINOPHEN 5-325 MG PO TABS
1.0000 | ORAL_TABLET | ORAL | Status: DC | PRN
  Administered 2013-08-31 – 2013-09-01 (×2): 1 via ORAL
  Filled 2013-08-31 (×2): qty 1

## 2013-08-31 MED ORDER — METHYLPREDNISOLONE 4 MG PO KIT: 4.0000 mg | PACK | ORAL | Status: AC

## 2013-08-31 MED ORDER — HEPARIN SODIUM (PORCINE) 5000 UNIT/ML IJ SOLN
5000.0000 [IU] | Freq: Three times a day (TID) | INTRAMUSCULAR | Status: DC
  Administered 2013-08-31 – 2013-09-01 (×3): 5000 [IU] via SUBCUTANEOUS
  Filled 2013-08-31 (×5): qty 1

## 2013-08-31 MED ORDER — METHYLPREDNISOLONE 4 MG PO KIT
8.0000 mg | PACK | Freq: Every evening | ORAL | Status: AC
  Administered 2013-08-31: 22:00:00 8 mg via ORAL

## 2013-08-31 MED ORDER — METHYLPREDNISOLONE 4 MG PO KIT
8.0000 mg | PACK | Freq: Every morning | ORAL | Status: AC
  Filled 2013-08-31: qty 21

## 2013-08-31 MED ORDER — METHYLPREDNISOLONE 4 MG PO KIT: 8.0000 mg | PACK | Freq: Every evening | ORAL | Status: DC

## 2013-08-31 MED ORDER — METHYLPREDNISOLONE 4 MG PO KIT
4.0000 mg | PACK | Freq: Three times a day (TID) | ORAL | Status: DC
  Administered 2013-09-01 (×2): 4 mg via ORAL

## 2013-08-31 MED ORDER — ENALAPRIL MALEATE 5 MG PO TABS
5.0000 mg | ORAL_TABLET | Freq: Every day | ORAL | Status: DC
  Administered 2013-08-31 – 2013-09-01 (×2): 5 mg via ORAL
  Filled 2013-08-31 (×2): qty 1

## 2013-08-31 MED ORDER — FENTANYL CITRATE 0.05 MG/ML IJ SOLN
50.0000 ug | Freq: Once | INTRAMUSCULAR | Status: AC
  Administered 2013-08-31: 50 ug via INTRAVENOUS
  Filled 2013-08-31: qty 2

## 2013-08-31 MED ORDER — METHYLPREDNISOLONE 4 MG PO KIT: 4.0000 mg | PACK | Freq: Four times a day (QID) | ORAL | Status: DC

## 2013-08-31 MED ORDER — SODIUM CHLORIDE 0.9 % IV BOLUS (SEPSIS)
1000.0000 mL | Freq: Once | INTRAVENOUS | Status: AC
  Administered 2013-08-31: 1000 mL via INTRAVENOUS

## 2013-08-31 MED ORDER — METHYLPREDNISOLONE 4 MG PO KIT: 4.0000 mg | PACK | ORAL | Status: DC

## 2013-08-31 NOTE — ED Notes (Signed)
Pt reports she was diagnosed with lupus 6 years ago, pt states she has not taken any of her medications since March, pt reports she has not seen her doctor to get her refills for her medications. Pt states her fingers become blue when she is cold, pt states if she were to go outside right now their color (pink) would return right away. Pt states she has been experiencing this lupus flare up for about a month.

## 2013-08-31 NOTE — ED Notes (Signed)
Attempted to give report 

## 2013-08-31 NOTE — ED Notes (Signed)
Pt here for lupur flare with discoloration of fingers and pain in hands and feet; pt sts abscess in mouth also

## 2013-08-31 NOTE — H&P (Addendum)
[facility] History and Physical  [name] [id number] DOB: Aug 06, 1981 DOA: 08/31/2013  Referring physician: ED PCP: Default, Provider, MD   Chief Complaint: Lupus  HPI: [name] is a 32 y.o. female with PMH of lupus, lupus nephritis, nephrotic syndrome, who presents to the ED with lupus flare up that started over the weekend.  Unfortunately the patient is non-compliant with medications and medical follow up, she is on no chronic suppressive medications at home and has not seen a rheumatologist in a year.  In the ED, lab work demonstrated progression of her chronic lupus nephritis with creatinine of 2.2 up from 1.8 in Jan.  Dr. Marval Regal agreed to see patient as inpatient since it was felt she would be non-compliant and miss follow up if discharged outpatient and medicine has been asked to admit.  Review of Systems: 12 systems reviewed and otherwise negative.  Past Medical History  Diagnosis Date  . Lupus (systemic lupus erythematosus)   . Lupus nephritis   . S/P pericardiocentesis 01/17/2013    H/o pericardial effusion with tamponade 2006   . H/O pleural effusion 01/17/2013  . H/O pericarditis 01/17/2013  . Cocaine abuse 01/18/2013   History reviewed. No pertinent past surgical history. Social History:  reports that she has been smoking.  She does not have any smokeless tobacco history on file. She reports that  drinks alcohol. She reports that she uses illicit drugs (Marijuana and Cocaine) about 5 times per week.   Allergies  Allergen Reactions  . Tobramycin Sulfate Swelling    History reviewed. No pertinent family history.  Prior to Admission medications   Medication Sig Start Date End Date Taking? Authorizing Provider  acetaminophen (TYLENOL) 500 MG tablet Take 1,000 mg by mouth every 6 (six) hours as needed for pain.   Yes Historical Provider, MD  diphenhydramine-acetaminophen (TYLENOL PM) 25-500 MG TABS Take 1 tablet by mouth at bedtime as needed.   Yes  Historical Provider, MD  ibuprofen (ADVIL,MOTRIN) 200 MG tablet Take 400 mg by mouth every 6 (six) hours as needed for pain.   Yes Historical Provider, MD   Physical Exam: Filed Vitals:   08/31/13 1900  BP: 149/99  Pulse: 64  Temp:   Resp: 16    General:  NAD, resting comfortably in bed Eyes: PEERLA EOMI ENT: mucous membranes moist Neck: supple w/o JVD Cardiovascular: RRR w/o MRG Respiratory: CTA B Abdomen: soft, nt, nd, bs+ Skin: no rash nor lesion Musculoskeletal: MAE, full ROM all 4 extremities Psychiatric: normal tone and affect Neurologic: AAOx3, grossly non-focal  Labs on Admission:  Basic Metabolic Panel:  Recent Labs Lab 08/31/13 1352  NA 135  K 4.6  CL 110  CO2 17*  GLUCOSE 67*  BUN 32*  CREATININE 2.20*  CALCIUM 7.8*   Liver Function Tests: No results found for this basename: AST, ALT, ALKPHOS, BILITOT, PROT, ALBUMIN,  in the last 168 hours No results found for this basename: LIPASE, AMYLASE,  in the last 168 hours No results found for this basename: AMMONIA,  in the last 168 hours CBC:  Recent Labs Lab 08/31/13 1352  WBC 5.1  NEUTROABS 3.2  HGB 10.0*  HCT 31.0*  MCV 82.0  PLT 296   Cardiac Enzymes: No results found for this basename: CKTOTAL, CKMB, CKMBINDEX, TROPONINI,  in the last 168 hours  BNP (last 3 results) No results found for this basename: PROBNP,  in the last 8760 hours CBG: No results found for this basename: GLUCAP,  in the last  168 hours  Radiological Exams on Admission: No results found.  EKG: Independently reviewed.  Assessment/Plan Principal Problem:   Lupus (systemic lupus erythematosus)   1. SLE - putting patient on medrol dos pak, restarting her ACEi as recommended by nephrology, and admitting to inpatient for consult.  Discussed with patient need for chronic follow up for this condition with a rheumatologist (she has one but just hasnt made an appointment in over a year) and nephrology.    Code Status: Full  Code (must indicate code status--if unknown or must be presumed, indicate so) Family Communication: Spoke with family at bedside (indicate person spoken with, if applicable, with phone number if by telephone) Disposition Plan: Admit to obs (indicate anticipated LOS)  Time spent: 50 min  Dinah Lupa M. [facility] Pager [phone]  If 7PM-7AM, please contact night-coverage [email or web address] Password [email or web address] 08/31/2013, 7:40 PM

## 2013-08-31 NOTE — ED Notes (Addendum)
Pt c/o lupus flare up that started about a month ago but this past week has gotten worse, sts her hands are getting bad cramps and having a darker color to his fingertips. sts she hasn't been taking any medications for lupus and hasn't been to any of her follow up appointment, reports she just doesn't like going so she stopped. Pt also sts she thinks she has an abscess on upper left mouth, sts it hurts worse when she eats. Pt in nad, skin warm and dry, resp e/u.

## 2013-08-31 NOTE — ED Notes (Signed)
Pt's bed assignment is being changed.

## 2013-08-31 NOTE — ED Provider Notes (Signed)
CSN: [id number]     Arrival date & time 08/31/13  1152 History   First MD Initiated Contact with Patient 08/31/13 1419     Chief Complaint  Patient presents with  . Lupus   (Consider location/radiation/quality/duration/timing/severity/associated sxs/prior Treatment) The history is provided by the patient. No language interpreter was used.  [name] is a 32 year old female with past medical history of lupus, lupus nephritis, pleural effusion, pericarditis, presenting to the ED with lupus flare up that started over the weekend. Patient reported that she noticed cramping in her fingers and toes, reported that there was mild swelling identified to her fingers and toes. Patient reported that she gets a flare-up if her lupus at least 1-2 times per month. Patient reported that the symptoms that she is having are similar to the ones that she has with each episode of lupus flareup. Patient reported that she's been having mild chest discomfort mostly at night, reported mild shortness of breath associated with the chest discomfort. Reported that the chest discomfort is localized to the center for chest described as a pressure sensation. Patient reported that this is been ongoing for long periods time. Patient reported that she's been going to the bathroom and increased frequencies, reported that she urinates at least 15 times per day. Patient stated she been having some mild diarrhea associated with her symptoms. Patient reported that she's been using hot compressions, eating mustard for quick relief with negative relief. Patient denied taking maintenance therapy. When asked regarding rheumatologist patient reported that she used to be seen by Dr. Charlestine Night, patient reported that she has not seen a rheumatologist in over 5 years. Patient reported that she was seen by nephrologist in March of 2014, reported that she schedule an appointment 2 weeks after first being seen but left [place], reported that when  she returned she never set up an appointment-patient reported that she has not been seen by provider in over 6 months. Denied difficulty breathing, neck pain, back pain, nausea, vomiting, abdominal discomfort, visual distortions. PCP none  Past Medical History  Diagnosis Date  . Lupus (systemic lupus erythematosus)   . Lupus nephritis   . S/P pericardiocentesis 01/17/2013    H/o pericardial effusion with tamponade 2006   . H/O pleural effusion 01/17/2013  . H/O pericarditis 01/17/2013  . Cocaine abuse 01/18/2013   History reviewed. No pertinent past surgical history. History reviewed. No pertinent family history. History  Substance Use Topics  . Smoking status: Current Every Day Smoker  . Smokeless tobacco: Not on file  . Alcohol Use: Yes   OB History   Grav Para Term Preterm Abortions TAB SAB Ect Mult Living                 Review of Systems  Constitutional: Negative for fever and chills.  HENT: Negative for sore throat, neck pain and neck stiffness.   Eyes: Negative for visual disturbance.  Respiratory: Negative for shortness of breath.   Cardiovascular: Negative for chest pain.  Gastrointestinal: Negative for vomiting, abdominal pain and diarrhea.  Genitourinary: Positive for frequency. Negative for decreased urine volume and difficulty urinating.  Musculoskeletal: Positive for arthralgias.  Neurological: Negative for dizziness, weakness and numbness.  All other systems reviewed and are negative.    Allergies  Tobramycin sulfate  Home Medications   Current Outpatient Rx  Name  Route  Sig  Dispense  Refill  . acetaminophen (TYLENOL) 500 MG tablet   Oral   Take 1,000 mg by mouth every  6 (six) hours as needed for pain.         . diphenhydramine-acetaminophen (TYLENOL PM) 25-500 MG TABS   Oral   Take 1 tablet by mouth at bedtime as needed.         Marland Kitchen ibuprofen (ADVIL,MOTRIN) 200 MG tablet   Oral   Take 400 mg by mouth every 6 (six) hours as needed for pain.            BP 147/103  Pulse 88  Temp(Src) 98.3 F (36.8 C) (Oral)  Resp 12  Ht 5\' 3"  (1.6 m)  Wt 122 lb 11.2 oz (55.656 kg)  BMI 21.74 kg/m2  SpO2 100% Physical Exam  Nursing note and vitals reviewed. Constitutional: She is oriented to person, place, and time. She appears well-developed and well-nourished. No distress.  HENT:  Head: Normocephalic and atraumatic.  Mouth/Throat: Oropharynx is clear and moist. No oropharyngeal exudate.    Poor oral dentition noted Negative posterior oropharynx swelling, inflammation, erythema Negative signs of peritonsillar abscess Negative signs of Ludwig's angina Negative swelling, inflammation, cyst, abscess noted to the buccal mucosa and gumline   Eyes: Conjunctivae and EOM are normal. Pupils are equal, round, and reactive to light. Right eye exhibits no discharge. Left eye exhibits no discharge.  Neck: Normal range of motion. Neck supple. No tracheal deviation present.  Cardiovascular: Normal rate, regular rhythm and normal heart sounds.  Exam reveals no friction rub.   No murmur heard. Pulses:      Radial pulses are 2+ on the right side, and 2+ on the left side.       Dorsalis pedis pulses are 2+ on the right side, and 2+ on the left side.  Negative foot and ankle swelling Negative pitting edema Negative pain upon palpation to calf bilaterally  Pulmonary/Chest: Effort normal and breath sounds normal. No respiratory distress. She has no wheezes. She has no rales.  Musculoskeletal: Normal range of motion. She exhibits no tenderness.  Negative swelling, erythema, inflammation noted to the upper and lower joints Negative pain upon palpations noted  Full range of motion to upper lower extremities bilaterally No signs of joint effusion identified  Lymphadenopathy:    She has no cervical adenopathy.  Neurological: She is alert and oriented to person, place, and time. She exhibits normal muscle tone. Coordination normal.  Strength 5+/5+ with  resistance bilaterally, equal distribution noted  Skin: Skin is warm and dry. No rash noted. She is not diaphoretic. No erythema.  Negative signs of inflammation Negative erythema  Psychiatric: She has a normal mood and affect. Her behavior is normal. Thought content normal.    ED Course  Procedures (including critical care time)  Patient's chart review by this provider. Patient was admitted to the hospital in January 2014 for facial swelling secondary to nephrotic syndrome. Patient was discharged with blood pressure medication, enalapril 5 mg daily. Patient was given discharge instructions to followup cardiology as well as nephrology. Patient reported she has not seen a rheumatologist in over 5 years. Patient reported she is not seen a cardiologist in a long periods time. Patient reported that she has not seen a nephrologist in over 5 months. Patient is not medically compliant. Patient does not take any medications for her lupus. Patient reported the only medication that she does take is Tylenol.  5:53 PM Spoke with Dr. Marval Regal regarding increase in patient's creatinine and status of nephrotic syndrome. Discussed possible recommendations - recommended that if patient to be discharged with ACEI.  Discussed case  and findings with attending, Dr. Doy Hutching - due to increased Creatinine levels, blood pressure elevation, lupus flare-up, and poor medical compliance - patient to be admitted to the hospital.  5:59 PM Spoke with Dr. Marval Regal - discussed with physician that patient to be admitted to the hospital. Dr. Marval Regal agreed to see patient in the hospital tomorrow. Physician aware that patient is to be admitted to the hospital.   7:21 PM Spoke with Dr. Sheran Luz from [facility], reported that he will be down to see patient.   7:34 PM Dr. Sheran Luz in [place] to assess patient. Patient admitted to internal medicine services for lupus erythematous, acute renal insufficiency, elevated  blood pressure, poor medical compliance.   Date: 08/31/2013  Rate: 71  Rhythm: normal sinus rhythm  QRS Axis: normal  Intervals: normal  ST/T Wave abnormalities: normal  Conduction Disutrbances:none  Narrative Interpretation:   Old EKG Reviewed: unchanged EKG was reviewed and analyzed by this provider and attending physician.  Labs Review Labs Reviewed  CBC WITH DIFFERENTIAL - Abnormal; Notable for the following:    RBC 3.78 (*)    Hemoglobin 10.0 (*)    HCT 31.0 (*)    All other components within normal limits  BASIC METABOLIC PANEL - Abnormal; Notable for the following:    CO2 17 (*)    Glucose, Bld 67 (*)    BUN 32 (*)    Creatinine, Ser 2.20 (*)    Calcium 7.8 (*)    GFR calc non Af Amer 29 (*)    GFR calc Af Amer 34 (*)    All other components within normal limits  SEDIMENTATION RATE - Abnormal; Notable for the following:    Sed Rate 108 (*)    All other components within normal limits  URINALYSIS, ROUTINE W REFLEX MICROSCOPIC - Abnormal; Notable for the following:    Hgb urine dipstick MODERATE (*)    Protein, ur >300 (*)    All other components within normal limits  URINE MICROSCOPIC-ADD ON - Abnormal; Notable for the following:    Casts HYALINE CASTS (*)    All other components within normal limits  C-REACTIVE PROTEIN  CBC  BASIC METABOLIC PANEL  POCT PREGNANCY, URINE  POCT I-STAT TROPONIN I   Imaging Review No results found.  MDM   1. Lupus (systemic lupus erythematosus)    Patient presenting to emergency department with a lupus flareup that started over the weekend. Reported that she's been having cramping and swelling localized to the hands and feet. Patient reported that she's been having ongoing mild chest discomfort, and the Center the chest described as a pressure. Patient reported that she's not been seen by rheumatologist in over 5 years, has not been seen by a cardiologist in many months, has not been seen by a nephrologist in over 5  months. Alert and oriented. Lungs clear to auscultation bilaterally. Heart rate and rhythm normal. Negative signs of erythema, swelling, inflammation noted to the joints, hands, fingers, feet, toes bilaterally. Full range of motion to upper lower extremities bilaterally. Sensation intact. Pulses palpable bilaterally to distal and proximal regions. Strength intact. Negative neurological deficits identified. Patient appears comfortable. EKG negative ischemic changes identified. First set of troponins negative. CBC anemia identified-when compared to previous labs this is a trend has been seen with decreased hemoglobin and hematocrit - hemoglobin and hematocrit actually improved from 7 months ago, hemoglobin going from 9.0 to 10.0 when  and hematocrit going from 27.0 to 31.0. Urine identified  spillage of proteins, current nephrotic syndrome identified-negative signs of infection. Urine pregnancy negative. BMP noted improvement in BUN from 40 to 32 with worsening of creatinine from a 2.09 to 2.20. Elevation in sedimentation rate identified of 108-patient currently in lupus flareup. Do to patient's poor medical compliance in association with increased creatinine level with worsening of the kidney functioning-nephrotic syndrome with acute renal impairment-patient to be admitted to internal medicine services for lupus flareup, elevated blood pressure, nephrotic syndrome, acute renal insufficiency. At this rate if patient does not probably control her symptoms or her condition patient will most likely need to be placed on dialysis. Patient admitted to [facility] by [facility], Dr. Alcario Drought, nephrology to consult tomorrow.     Jamse Mead, PA-C 09/01/13 1012

## 2013-08-31 NOTE — ED Notes (Signed)
Iv team at bedside  

## 2013-08-31 NOTE — ED Notes (Signed)
Attempted to gain IV access X 2, unsuccessful. Called IV team.

## 2013-09-01 LAB — BASIC METABOLIC PANEL
BUN: 30 mg/dL — ABNORMAL HIGH (ref 6–23)
Creatinine, Ser: 1.94 mg/dL — ABNORMAL HIGH (ref 0.50–1.10)
GFR calc Af Amer: 39 mL/min — ABNORMAL LOW (ref >90)
GFR calc non Af Amer: 34 mL/min — ABNORMAL LOW (ref >90)
Glucose, Bld: 103 mg/dL — ABNORMAL HIGH (ref 70–99)

## 2013-09-01 LAB — HEPATITIS C ANTIBODY (REFLEX): HCV Ab: NEGATIVE

## 2013-09-01 LAB — CBC
HCT: 30.1 % — ABNORMAL LOW (ref 36.0–46.0)
Hemoglobin: 10.1 g/dL — ABNORMAL LOW (ref 12.0–15.0)
MCH: 27.1 pg (ref 26.0–34.0)
MCHC: 33.6 g/dL (ref 30.0–36.0)
MCV: 80.7 fL (ref 78.0–100.0)
RBC: 3.73 MIL/uL — ABNORMAL LOW (ref 3.87–5.11)
RDW: 14.2 % (ref 11.5–15.5)
WBC: 4 10*3/uL (ref 4.0–10.5)

## 2013-09-01 LAB — IRON AND TIBC: UIBC: 93 ug/dL — ABNORMAL LOW (ref 125–400)

## 2013-09-01 LAB — PROTEIN / CREATININE RATIO, URINE: Total Protein, Urine: 666.2 mg/dL

## 2013-09-01 LAB — HEPATITIS B SURFACE ANTIGEN: Hepatitis B Surface Ag: NEGATIVE

## 2013-09-01 LAB — RPR: RPR Ser Ql: NONREACTIVE

## 2013-09-01 MED ORDER — LISINOPRIL 20 MG PO TABS: 20.0000 mg | ORAL_TABLET | Freq: Every day | ORAL | Status: DC

## 2013-09-01 MED ORDER — SODIUM BICARBONATE 650 MG PO TABS: 1300.0000 mg | ORAL_TABLET | Freq: Two times a day (BID) | ORAL | Status: DC

## 2013-09-01 MED ORDER — ONDANSETRON HCL 4 MG/2ML IJ SOLN
4.0000 mg | Freq: Three times a day (TID) | INTRAMUSCULAR | Status: DC | PRN
  Administered 2013-09-01: 10:00:00 4 mg via INTRAVENOUS
  Filled 2013-09-01: qty 2

## 2013-09-01 MED ORDER — SODIUM BICARBONATE 650 MG PO TABS
1300.0000 mg | ORAL_TABLET | Freq: Two times a day (BID) | ORAL | Status: DC
  Administered 2013-09-01: 15:00:00 1300 mg via ORAL
  Filled 2013-09-01 (×2): qty 2

## 2013-09-01 MED ORDER — METHYLPREDNISOLONE (PAK) 4 MG PO TABS: ORAL_TABLET | ORAL | Status: DC

## 2013-09-01 NOTE — Progress Notes (Signed)
[facility] PROGRESS NOTE  [name] [id number] DOB: 1981-02-06 DOA: 08/31/2013 PCP: Default, Provider, MD  Assessment/Plan: 1. Flare of SLE - secondary to more than 1 year of poor compliance with taking medications and following up with her rheumatologist.  Counseled pt on the importance of regular outpatient follow up and pt will call rheumatologist for appointment as soon as she is discharged. Zofran IV as needed for nausea ordered.  2. Lupus Nephritis - creatinine has bumped. Renal consulted to see patient in hospital, following 3. Hypertension - pt was restarted on ACEI enalapril 5 mg po daily.  4. Anemia - likely multifactorial, Hg at 10 and following. 5. History of substance abuse    Code Status: Full  Family Communication: Spouse in room, updated Disposition Plan: pending renal consultation   HPI/Subjective: Pt reports that she is having less tingling and burning in her fingertips.  Also, pt says she has had some nausea after taking medrol.   Objective: Filed Vitals:   09/01/13 0933  BP: 140/98  Pulse:   Temp:   Resp:     Intake/Output Summary (Last 24 hours) at 09/01/13 1121 Last data filed at 09/01/13 0700  Gross per 24 hour  Intake   1480 ml  Output      0 ml  Net   1480 ml   Filed Weights   08/31/13 1219 08/31/13 2136  Weight: 55.656 kg (122 lb 11.2 oz) 55.656 kg (122 lb 11.2 oz)    Exam:   General:  Awake, alert, no distress, cooperative  Cardiovascular: normal s1, s2 sounds  Respiratory: BBS clear   Abdomen: soft, nondistended, nontender no masses  Ext: no cyanosis or clubbing seen    Data Reviewed: Basic Metabolic Panel:  Recent Labs Lab 08/31/13 1352 09/01/13 0526  NA 135 133*  K 4.6 5.0  CL 110 111  CO2 17* 15*  GLUCOSE 67* 103*  BUN 32* 30*  CREATININE 2.20* 1.94*  CALCIUM 7.8* 7.8*   Liver Function Tests: No results found for this basename: AST, ALT, ALKPHOS, BILITOT, PROT, ALBUMIN,  in the last 168 hours No  results found for this basename: LIPASE, AMYLASE,  in the last 168 hours No results found for this basename: AMMONIA,  in the last 168 hours CBC:  Recent Labs Lab 08/31/13 1352 09/01/13 0526  WBC 5.1 4.0  NEUTROABS 3.2  --   HGB 10.0* 10.1*  HCT 31.0* 30.1*  MCV 82.0 80.7  PLT 296 310   Cardiac Enzymes: No results found for this basename: CKTOTAL, CKMB, CKMBINDEX, TROPONINI,  in the last 168 hours BNP (last 3 results) No results found for this basename: PROBNP,  in the last 8760 hours CBG: No results found for this basename: GLUCAP,  in the last 168 hours  No results found for this or any previous visit (from the past 240 hour(s)).   Studies: No results found.  Scheduled Meds: . enalapril  5 mg Oral Daily  . heparin  5,000 Units Subcutaneous Q8H  . methylPREDNISolone  4 mg Oral PC lunch  . methylPREDNISolone  4 mg Oral PC supper  . methylPREDNISolone  4 mg Oral 3 x daily with food  . [START ON 09/02/2013] methylPREDNISolone  4 mg Oral 4X daily taper  . methylPREDNISolone  8 mg Oral Nightly   Continuous Infusions:   Principal Problem:   Lupus (systemic lupus erythematosus)   [facility] Pager [phone]. If 7PM-7AM, please contact night-coverage at [email or web address], password [facility] 09/01/2013, 11:21 AM  LOS: 1 day
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
## 2013-09-01 NOTE — Discharge Summary (Signed)
Physician Discharge Summary  [name] [id number] DOB: July 20, 1981 DOA: 08/31/2013  PCP: Default, Provider, MD  Admit date: 08/31/2013 Discharge date: 09/01/2013  Recommendations for Outpatient Follow-up:  1. Follow up on labs drawn in hospital 2. Recheck BMP 3. Recheck CBC  Discharge Diagnoses:  Principal Problem:   Lupus (systemic lupus erythematosus)  Discharge Condition: stable   Diet recommendation: 2 gram low sodium diet recommended  Filed Weights   08/31/13 1219 08/31/13 2136  Weight: 55.656 kg (122 lb 11.2 oz) 55.656 kg (122 lb 11.2 oz)    History of present illness:  32 y.o. female with h/o lupus nephritis, nephrotic syndrome, presenting with lupus flare causing raynaud's phenomenon (fingers and feet tingling/blueish) for ~1 week and dental pain, found to have elevated creatinine. H/o noncompliance and poor follow-up, on no chronic suppressive meds and has not followed up in 1 year with rheumatology and in 6 years with renal (last visit 02/2013, before that 2006). Admitted due to progression of lupus nephritis with creatinine 2.2 (from 1.8 January), with renal consult to help manage nephritis.  Per [facility]'s last office note 03/24/13, patient had previously been on prednisone upon diagnosis in 2004 and then cellcept for 2 years, after which she stopped taking this and was lost to follow up for ~6 years during which she states she was off all medications for lupus. 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 Her creatinine had been 0.5 in 2008 and baseline BP had been 110s/70s. Patient has h/o substance abuse. In March 2013, lab studies showed low C3 (65), high urine protein (362.4), high protein:creatinine (3337), low TIBC (206), low sat (20), and iron at 42.   Hospital Course:  1. CKD, Cr 2.2 on admission, GFR 34 (person of her weight, likely closer to 30): Likely due to worsening uncontrolled lupus nephritis (class V with membranous nephritis on  biopsy 11/2005, urine protein >300, mod hgb, hyaline casets, elevated ESR, normal CRP) along with uncontrolled HTN (150s/110s). Pt with h/o noncompliance with medications and very poor follow up (diagnosed 2004, took steroids/cellcept until 2006, then stopped and next f/u 2014)  --Continue lisinopril, restarted by primary team  --Steroid dosepack started,  cr stable (1.94 on 9/5)  --Check:  spot urine for protein and creatinine, Complement levels (C3, C4, and CH50) and antiDSdna (increases/decreases with lupus activity) to help assess basis for tx and f/u parameters.  --No bx at this time due to poor f/u  --Discussed stopping drugs/tobacco, discussed at length the importance of regular follow up  --Restrict to 2g Na daily --Would hold off on escalated therapy until labwork back and until patient proves follow-up in clinic   2. Lupus - Manifestations in this patient include nephritis, reynauds, polyserosistis hx (pericardial effusion), lupus pernio. Very poor follow up.  --Strongly encouraged f/u with rheumatology and nephrology as outpatient  --Primary put on medrol dosepack, restarted Ace-i on admission   3. Hypertension: Pt unsure how long, states started on enalapril since March though has not been taking at home. Still elevated, 150s/110s on admission, currently 140/98, change to lisinopril 20mg  q evening for once daily dosing in pt who has proven poor compliance to control BP (goal 130/80) and suppress proteinuria  --Continue to monitor outpatient  4. Anemia of ESRD: Hgb 10, baseline appears to be 9-10. increased fatigue, ice pica  --Iron studies today Follow up results outpatient --check LDH and haptoglobin  --Urine with moderate hemoglobin   5. Mild hyponatremia today to 133   6. 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 H/o pericardial effusion/pericarditis - EKG with nonspecific t wave abnormalities, QTc borderline (423) done with h/o pericardial effusion/pericarditis; currently asymptomatic   8. Social - Patient works at [employer] and is sexually active and has substance abuse hx  - Will check HIV, hepatitis B surface antigen, hep C antibody in chronically ill patient,  Follow up results with Dr. Marval Regal outpatient  9. Dispo - Pending lab collection. From renal point of view, patient can be discharged but needs to follow up closely with both Dr. Vanita Panda and Rheumatology. Pt verbalized understanding of follow up need.   Consultations:  Nephrology  Discharge Exam: Filed Vitals:   09/01/13 1426  BP: 135/64  Pulse: 73  Temp: 98.1 F (36.7 C)  Resp: 18    General: awake, alert, no distress Cardiovascular: normal s1, s2 sounds Respiratory: BBS clear to auscultation  Discharge Instructions  Discharge Orders   Future Orders Complete By Expires   Discharge instructions  As directed    Comments:     Please make an appt to see Dr. Madelon Lips (nephrologist) in 1 week Please make an appt to see your rheumatologist in next 1-2 weeks for follow up  Follow up all of your lab results from hospital with Dr. Marval Regal next week. Return if symptoms recur, worsen or new problems develop.   Increase activity slowly  As directed        Medication List    STOP taking these medications       ibuprofen 200 MG tablet  Commonly known as:  ADVIL,MOTRIN      TAKE these medications       acetaminophen 500 MG tablet  Commonly known as:  TYLENOL  Take 1,000 mg by mouth every 6 (six) hours as needed for pain.     diphenhydramine-acetaminophen 25-500 MG Tabs  Commonly known as:  TYLENOL PM  Take 1 tablet by mouth at bedtime as needed.     lisinopril 20 MG tablet  Commonly known as:  PRINIVIL,ZESTRIL  Take 1 tablet (20 mg total) by mouth daily.   Start taking on:  09/02/2013     methylPREDNIsolone 4 MG tablet  Commonly known as:  MEDROL DOSPACK  follow package directions     sodium bicarbonate 650 MG tablet  Take 2 tablets (1,300 mg total) by mouth 2 (two) times daily.       Allergies  Allergen Reactions  . Tobramycin Sulfate Swelling       Follow-up Information   Schedule an appointment as soon as possible for a visit with COLADONATO,JOSEPH A, MD. (follow up lab results from hospital )    Specialty:  Nephrology   Contact information:   [place] [address] [phone]       Follow up with Your rheumatologist . Schedule an appointment as soon as possible for a visit in 2 weeks.     The results of significant diagnostics from this hospitalization (including imaging, microbiology, ancillary and laboratory) are listed below for reference.    Significant Diagnostic Studies: No results found.  Microbiology: No results found for this or any previous visit (from the past 240 hour(s)).   Labs: Basic Metabolic Panel:  Recent Labs Lab 08/31/13 1352 09/01/13 0526  NA 135 133*  K 4.6 5.0  CL 110 111  CO2 17* 15*  GLUCOSE 67* 103*  BUN 32* 30*  CREATININE 2.20* 1.94*  CALCIUM 7.8* 7.8*   Liver Function Tests: No results found for this basename: AST, ALT, ALKPHOS, BILITOT, PROT, ALBUMIN,  in the last 168 hours No results found for this basename: LIPASE, AMYLASE,  in the last 168 hours No results found for this basename: AMMONIA,  in the last 168 hours CBC:  Recent Labs Lab 08/31/13 1352 09/01/13 0526  WBC 5.1 4.0  NEUTROABS 3.2  --   HGB 10.0* 10.1*  HCT 31.0* 30.1*  MCV 82.0 80.7  PLT 296 310   Cardiac Enzymes: No results found for this basename: CKTOTAL, CKMB, CKMBINDEX, TROPONINI,  in the last 168 hours BNP: BNP (last 3 results) No results found for this basename: PROBNP,  in the last 8760 hours CBG: No results found for this basename: GLUCAP,  in the  last 168 hours  Signed:  Ife Vitelli  [facility] 09/01/2013, 3:49 PM

## 2013-09-01 NOTE — Progress Notes (Signed)
Discussed discharge instructions, medications, and follow-up appointments with pt. Pt showed no barriers to discharge. IV removed. Pt waiting on ride home from family member. Assessment unchanged from morning.

## 2013-09-01 NOTE — ED Provider Notes (Signed)
Medical screening examination/treatment/procedure(s) were performed by non-physician practitioner and as supervising physician I was immediately available for consultation/collaboration.   Aviannah Castoro B. Karle Starch, MD 09/01/13 Lurena Nida

## 2013-09-01 NOTE — Progress Notes (Signed)
Notified Walden Field, NP that patient was d/c today. Lab just called to state that pt's total protein in urine is 666.2. The original report was less than 4.0. Donnal Debar, NP stated that she would let Wynetta Emery, MD know which was pt's doctor while in the hospital. Ranelle Oyster, RN

## 2013-09-04 LAB — PTH, INTACT AND CALCIUM: Calcium, Total (PTH): 7.6 mg/dL — ABNORMAL LOW (ref 8.4–10.5)

## 2013-09-04 LAB — COMPLEMENT, TOTAL: Compl, Total (CH50): 17 U/mL — ABNORMAL LOW (ref 31–60)

## 2014-01-29 ENCOUNTER — Encounter (HOSPITAL_COMMUNITY): Payer: Self-pay | Admitting: Emergency Medicine

## 2014-01-29 ENCOUNTER — Inpatient Hospital Stay (HOSPITAL_COMMUNITY)
Admission: EM | Admit: 2014-01-29 | Discharge: 2014-02-08 | DRG: 674 | Disposition: A | Discharge status: Home / Self Care | Payer: Medicaid Other | Attending: Infectious Disease | Admitting: Infectious Disease

## 2014-01-29 ENCOUNTER — Emergency Department (HOSPITAL_COMMUNITY): Payer: Medicaid Other

## 2014-01-29 DIAGNOSIS — N186 End stage renal disease: Secondary | ICD-10-CM | POA: Diagnosis present

## 2014-01-29 DIAGNOSIS — E8729 Other acidosis: Secondary | ICD-10-CM

## 2014-01-29 DIAGNOSIS — N2581 Secondary hyperparathyroidism of renal origin: Secondary | ICD-10-CM | POA: Diagnosis present

## 2014-01-29 DIAGNOSIS — Z8709 Personal history of other diseases of the respiratory system: Secondary | ICD-10-CM

## 2014-01-29 DIAGNOSIS — D509 Iron deficiency anemia, unspecified: Secondary | ICD-10-CM | POA: Diagnosis present

## 2014-01-29 DIAGNOSIS — N19 Unspecified kidney failure: Secondary | ICD-10-CM

## 2014-01-29 DIAGNOSIS — Z888 Allergy status to other drugs, medicaments and biological substances status: Secondary | ICD-10-CM

## 2014-01-29 DIAGNOSIS — M329 Systemic lupus erythematosus, unspecified: Secondary | ICD-10-CM | POA: Diagnosis present

## 2014-01-29 DIAGNOSIS — R079 Chest pain, unspecified: Secondary | ICD-10-CM

## 2014-01-29 DIAGNOSIS — F1410 Cocaine abuse, uncomplicated: Secondary | ICD-10-CM

## 2014-01-29 DIAGNOSIS — Z9889 Other specified postprocedural states: Secondary | ICD-10-CM

## 2014-01-29 DIAGNOSIS — N179 Acute kidney failure, unspecified: Principal | ICD-10-CM | POA: Diagnosis present

## 2014-01-29 DIAGNOSIS — M3214 Glomerular disease in systemic lupus erythematosus: Secondary | ICD-10-CM | POA: Diagnosis present

## 2014-01-29 DIAGNOSIS — L03211 Cellulitis of face: Secondary | ICD-10-CM

## 2014-01-29 DIAGNOSIS — F17200 Nicotine dependence, unspecified, uncomplicated: Secondary | ICD-10-CM | POA: Diagnosis present

## 2014-01-29 DIAGNOSIS — L03213 Periorbital cellulitis: Secondary | ICD-10-CM

## 2014-01-29 DIAGNOSIS — F1210 Cannabis abuse, uncomplicated: Secondary | ICD-10-CM | POA: Diagnosis present

## 2014-01-29 DIAGNOSIS — H6691 Otitis media, unspecified, right ear: Secondary | ICD-10-CM

## 2014-01-29 DIAGNOSIS — E8809 Other disorders of plasma-protein metabolism, not elsewhere classified: Secondary | ICD-10-CM | POA: Diagnosis present

## 2014-01-29 DIAGNOSIS — N058 Unspecified nephritic syndrome with other morphologic changes: Secondary | ICD-10-CM | POA: Diagnosis present

## 2014-01-29 DIAGNOSIS — N039 Chronic nephritic syndrome with unspecified morphologic changes: Secondary | ICD-10-CM

## 2014-01-29 DIAGNOSIS — E872 Acidosis, unspecified: Secondary | ICD-10-CM

## 2014-01-29 DIAGNOSIS — D631 Anemia in chronic kidney disease: Secondary | ICD-10-CM | POA: Diagnosis present

## 2014-01-29 DIAGNOSIS — E0590 Thyrotoxicosis, unspecified without thyrotoxic crisis or storm: Secondary | ICD-10-CM | POA: Diagnosis present

## 2014-01-29 DIAGNOSIS — J90 Pleural effusion, not elsewhere classified: Secondary | ICD-10-CM | POA: Diagnosis present

## 2014-01-29 DIAGNOSIS — Z992 Dependence on renal dialysis: Secondary | ICD-10-CM

## 2014-01-29 DIAGNOSIS — I10 Essential (primary) hypertension: Secondary | ICD-10-CM | POA: Diagnosis present

## 2014-01-29 DIAGNOSIS — Z8679 Personal history of other diseases of the circulatory system: Secondary | ICD-10-CM

## 2014-01-29 DIAGNOSIS — N049 Nephrotic syndrome with unspecified morphologic changes: Secondary | ICD-10-CM | POA: Diagnosis present

## 2014-01-29 HISTORY — DX: Other psychoactive substance abuse, uncomplicated: F19.10

## 2014-01-29 LAB — BASIC METABOLIC PANEL
BUN: 77 mg/dL — ABNORMAL HIGH (ref 6–23)
CALCIUM: 6 mg/dL — AB (ref 8.4–10.5)
CHLORIDE: 107 meq/L (ref 96–112)
CO2: 11 meq/L — AB (ref 19–32)
Creatinine, Ser: 13.27 mg/dL — ABNORMAL HIGH (ref 0.50–1.10)
GFR calc Af Amer: 4 mL/min — ABNORMAL LOW (ref >90)
GFR calc non Af Amer: 3 mL/min — ABNORMAL LOW (ref >90)
GLUCOSE: 82 mg/dL (ref 70–99)
Potassium: 5.2 mEq/L (ref 3.7–5.3)
SODIUM: 137 meq/L (ref 137–147)

## 2014-01-29 LAB — CBC
HCT: 24.5 % — ABNORMAL LOW (ref 36.0–46.0)
HEMOGLOBIN: 8.3 g/dL — AB (ref 12.0–15.0)
MCH: 25.4 pg — AB (ref 26.0–34.0)
MCHC: 33.9 g/dL (ref 30.0–36.0)
MCV: 74.9 fL — ABNORMAL LOW (ref 78.0–100.0)
Platelets: 304 10*3/uL (ref 150–400)
RBC: 3.27 MIL/uL — AB (ref 3.87–5.11)
RDW: 15.3 % (ref 11.5–15.5)
WBC: 6.5 10*3/uL (ref 4.0–10.5)

## 2014-01-29 LAB — POCT PREGNANCY, URINE: Preg Test, Ur: NEGATIVE

## 2014-01-29 LAB — PRO B NATRIURETIC PEPTIDE: PRO B NATRI PEPTIDE: 32025 pg/mL — AB (ref 0–125)

## 2014-01-29 LAB — POCT I-STAT TROPONIN I: Troponin i, poc: 0.01 ng/mL (ref 0.00–0.08)

## 2014-01-29 MED ORDER — SODIUM CHLORIDE 0.9 % IV SOLN
1000.0000 mg | INTRAVENOUS | Status: DC
  Administered 2014-01-30 – 2014-01-31 (×3): 1000 mg via INTRAVENOUS
  Filled 2014-01-29 (×4): qty 8

## 2014-01-29 MED ORDER — SODIUM BICARBONATE 650 MG PO TABS
325.0000 mg | ORAL_TABLET | Freq: Three times a day (TID) | ORAL | Status: DC
  Administered 2014-01-30 (×2): 325 mg via ORAL
  Filled 2014-01-29 (×4): qty 0.5

## 2014-01-29 MED ORDER — MYCOPHENOLATE MOFETIL 250 MG PO CAPS
500.0000 mg | ORAL_CAPSULE | Freq: Two times a day (BID) | ORAL | Status: DC
  Administered 2014-01-30: 500 mg via ORAL
  Filled 2014-01-29 (×3): qty 2

## 2014-01-29 MED ORDER — HEPARIN SODIUM (PORCINE) 5000 UNIT/ML IJ SOLN
5000.0000 [IU] | Freq: Three times a day (TID) | INTRAMUSCULAR | Status: DC
  Administered 2014-01-30 (×3): 5000 [IU] via SUBCUTANEOUS
  Filled 2014-01-29 (×5): qty 1

## 2014-01-29 MED ORDER — SODIUM CHLORIDE 0.9 % IV SOLN
1.0000 g | Freq: Once | INTRAVENOUS | Status: AC
  Administered 2014-01-29: 1 g via INTRAVENOUS
  Filled 2014-01-29: qty 10

## 2014-01-29 NOTE — ED Notes (Signed)
Patient called x2. No answer.

## 2014-01-29 NOTE — H&P (Signed)
Date: 01/30/2014               Patient Name:  [name] MRN: [id number]  DOB: 04/20/1981 Age / Sex: 33 y.o., female   PCP: No Pcp Per Patient         Medical Service: Internal Medicine Teaching Service         Attending Physician: Dr. Randall Hiss, MD    First Contact: Dr. Mikey Bussing Pager: [phone]  Second Contact: Dr. Zada Girt Pager: [phone]       After Hours (After 5p/  First Contact Pager: [phone]  weekends / holidays): Second Contact Pager: [phone]   Chief Complaint: SOB  History of Present Illness:  The patient is a 33 yo woman, history of SLE, lupus nephritis, polysubstance abuse, presenting with shortness of breath.  The patient notes a 3-month history of symptoms SOB, progressive DOE now only able to walk to her mailbox, orthopnea requiring her to sleep upright, and increasing LE and abdominal edema, though no PND.  The patient notes several other associated symptoms for the last month, including fatigue, an erythematous patchy rash on her bilateral legs, a non-productive cough, bilateral lateral chest pain, mostly posterior lower chest, that worse when she lies flat, polyuria, nocturia (every 1 hour), "bubbles" in her urine, and 4-5 loose stools per day.  She notes no fever, blurry vision, dysuria, myalgias/arthralgias, hair changes, nausea/vomiting, abd pain, or appetite/weight changes.  She notes taking about 4 Advil PM during the past weekend to help her sleep, as well as a few Tylenol PM, otherwise no significant use of OTC meds.  The patient has a history of Lupus, previously managed by Dr. Kellie Simmering (Rheum), Coladonato (Renal), and Nishan (Cards).  However, the patient notes discontinuing her lupus meds (Cellcept) about 2 years ago due to feeling as if she didn't need these medications as she was doing well, and not following up with her outpatient doctors.  She has a history of lupus nephritis, diagnosed by renal biopsy in 2006.  Meds: Current Facility-Administered  Medications  Medication Dose Route Frequency Provider Last Rate Last Dose  . heparin injection 5,000 Units  5,000 Units Subcutaneous Q8H Sahory Nordling, MD   5,000 Units at 01/30/14 0114  . methylPREDNISolone sodium succinate (SOLU-MEDROL) 1,000 mg in sodium chloride 0.9 % 50 mL IVPB  1,000 mg Intravenous Q24H Jamerion Cabello, MD   1,000 mg at 01/30/14 0303  . mycophenolate (CELLCEPT) capsule 500 mg  500 mg Oral BID Adylee Leonardo, MD      . sodium bicarbonate tablet 325 mg  325 mg Oral TID Kennis Carina, MD   325 mg at 01/30/14 0114    Allergies: Allergies as of 01/29/2014 - Review Complete 01/29/2014  Allergen Reaction Noted  . Tobramycin sulfate Swelling 07/05/2012   Past Medical History  Diagnosis Date  . Lupus (systemic lupus erythematosus)     Previously followed with Dr. Kellie Simmering, has not followed up recently  . Lupus nephritis 2006    Renal biopsy shows segmental endocapillary proliferation and cellular crescent formation (Class IIIA) and lupus membranous glomerulopathy (Class V, stage II)  . S/P pericardiocentesis 01/17/2013    H/o pericardial effusion with tamponade 2006   . H/O pleural effusion 01/17/2013  . H/O pericarditis 01/17/2013  . Polysubstance abuse     cocaine, MJ, tobacco   History reviewed. No pertinent past surgical history. History reviewed. No pertinent family history. History   Social History  . Marital Status: Single    Spouse Name:  N/A    Number of Children: N/A  . Years of Education: N/A   Occupational History  . Not on file.   Social History Main Topics  . Smoking status: Current Every Day Smoker -- 0.25 packs/day for 15 years  . Smokeless tobacco: Not on file  . Alcohol Use: Yes     Comment: "binge drinking" most weekends, per pt report  . Drug Use: 5.00 per week    Special: Marijuana, Cocaine  . Sexual Activity: Yes   Other Topics Concern  . Not on file   Social History Narrative  . No narrative on file    Review of  Systems: ROS: General: no fevers, chills, changes in weight, changes in appetite Skin: see HPI HEENT: no blurry vision, hearing changes, sore throat Pulm: see HPI CV: see HPI Abd: no abdominal pain, nausea/vomiting GU: no dysuria, hematuria Ext: no arthralgias, myalgias Neuro: +chronic tingling in bilateral fingers, no seizures or headaches.  Physical Exam: Blood pressure 159/112, pulse 86, temperature 98.4 F (36.9 C), temperature source Oral, resp. rate 20, height 5\' 4"  (1.626 m), weight 125 lb 9.6 oz (56.972 kg), last menstrual period 11/27/2013, SpO2 97.00%. General: alert, cooperative, lying in bed, appears as stated age. HEENT: PERRL, EOMI, conjunctivae without injection, tongue with hyperpigmented macules (chronic, per pt), no tonsillar erythema,  Neck: supple, no lymphadenopathy Lungs: normal work of respiration, absent breath sounds- Left lung base, some crackles on the right. Heart: regular rate and rhythm, no murmurs, gallops, or rubs Abdomen: soft, non-tender, non-distended, normal bowel sounds, multiple striae present Extremities: 1+ bilateral pitting edema to the mid shin, grouped hyperpigmented patches on bilateral legs Neurologic: alert & oriented X3, cranial nerves II-XII intact, strength grossly intact, sensation intact to light touch.   Lab results: Basic Metabolic Panel:  Recent Labs  16/10/96 1818 01/30/14 0020  NA 137  --   K 5.2  --   CL 107  --   CO2 11*  --   GLUCOSE 82  --   BUN 77*  --   CREATININE 13.27*  --   CALCIUM 6.0*  --   MG  --  1.5  PHOS  --  10.5*   Liver Function Tests:  Recent Labs  01/30/14 0020  AST 10  ALT 5  ALKPHOS 46  BILITOT <0.2*  PROT 6.9  ALBUMIN 1.0*   No results found for this basename: LIPASE, AMYLASE,  in the last 72 hours No results found for this basename: AMMONIA,  in the last 72 hours CBC:  Recent Labs  01/29/14 1818  WBC 6.5  HGB 8.3*  HCT 24.5*  MCV 74.9*  PLT 304   Cardiac Enzymes: No  results found for this basename: CKTOTAL, CKMB, CKMBINDEX, TROPONINI,  in the last 72 hours BNP:  Recent Labs  01/29/14 1818  PROBNP 32025.0*   D-Dimer: No results found for this basename: DDIMER,  in the last 72 hours CBG: No results found for this basename: GLUCAP,  in the last 72 hours Hemoglobin A1C: No results found for this basename: HGBA1C,  in the last 72 hours Fasting Lipid Panel: No results found for this basename: CHOL, HDL, LDLCALC, TRIG, CHOLHDL, LDLDIRECT,  in the last 72 hours Thyroid Function Tests: No results found for this basename: TSH, T4TOTAL, FREET4, T3FREE, THYROIDAB,  in the last 72 hours Anemia Panel: No results found for this basename: VITAMINB12, FOLATE, FERRITIN, TIBC, IRON, RETICCTPCT,  in the last 72 hours Coagulation:  Recent Labs  01/30/14 0020  LABPROT 14.4  INR 1.14   Urine Drug Screen: Drugs of Abuse     Component Value Date/Time   LABOPIA NONE DETECTED 01/17/2013 1058   COCAINSCRNUR POSITIVE* 01/17/2013 1058   LABBENZ NONE DETECTED 01/17/2013 1058   AMPHETMU NONE DETECTED 01/17/2013 1058   THCU POSITIVE* 01/17/2013 1058   LABBARB NONE DETECTED 01/17/2013 1058    Alcohol Level: No results found for this basename: ETH,  in the last 72 hours Urinalysis: No results found for this basename: COLORURINE, APPERANCEUR, LABSPEC, PHURINE, GLUCOSEU, HGBUR, BILIRUBINUR, KETONESUR, PROTEINUR, UROBILINOGEN, NITRITE, LEUKOCYTESUR,  in the last 72 hours Misc. Labs:   Imaging results:  Dg Chest 2 View  01/29/2014   CLINICAL DATA:  Chest pain and shortness of breath.  EXAM: CHEST  2 VIEW  COMPARISON:  01/17/2013.  FINDINGS: Interval moderate to large-sized left pleural effusion and small right pleural effusion. No gross change borderline enlargement of the cardiac silhouette. Left basilar atelectasis adjacent to the pleural fluid. Unremarkable bones.  IMPRESSION: 1. Interval moderate to large-sized left pleural effusion with adjacent left basilar atelectasis.  2. Interval small right pleural effusion.   Electronically Signed   By: Gordan Payment M.D.   On: 01/29/2014 19:33    Other results: EKG: NSR. TWI in II, III, aVF, as well as V4-6.  These changes are seen on prior EKG's, though perhaps not a prominently as on today's EKG.  Assessment & Plan by Problem:  Lupus Nephritis with acute renal failure- Intrinsic- in a pt with hx of SLE- most likely a flare. Pt is Likely in the polyuric phase of ARF. No hx of signif NSAID use, no sign of hypovolemia/dehydration, though reported diarrhea, but pt reports increased fluid intake. Pt currently appears fluid overloaded, with pedal edema. Biopsy results- 12/11/2005- Combined focal nephritis with segmental endocapillary proliferation, crescent formation and lupus membranous glomerulopathy. Cr levels- markedly elevated at 13.27, from baseline of 1.3-2.2. BUN increased to 77 from baseline of ~35. . - C3, C4 levels - FeNa and Cr - Consider Renal US - Anti-DsDNA- though specific for lupus flare may be normal in pt with membranous Lupus nephritis. - CMP in the am- check Albumin. - ESR - Urinalysis - Nephrology consult, consider rheumatology Consult. - Start immunosuppresive therapy- Solumedrol 1g IV daily for 3 days, Cellcept- 0.5g BID X1week and then uptitrate. - LFT - INR-PT - APTT - Urine protein to Cr ratio  Bilat Pleural effusion- Most likely transudate commonly seen in SLE. No suspicion for infection/empyema, as pt is currently afebrile, and without leukocytosis, saturating 92% on room air . If respiratory compromise develops, may require thoracocentesis, diagnostic and therapeutic.   Chest pain- Not related to breathing, but changes with position, and bilat lower back. Most likely due to Pleuritis+ Pleural effusion. I-stat trop- negative. EKG not suggestive of Pericardial effusion- Low voltage and electric alternans, no ST elevation suggestive of constrictive pericarditis. - Troponin X1 - Cardiac  monitoring - Consider Echo to rule out effusion.  Acute Systemic Lupus erythematosus- Pt was on Cellcept(Mycophenoloate mofetil) in the past, has not been on any meds in the past over 2-3 years. Has a rheumatologist, but has not followed up. Two organ system currently involved- Lungs and renal. - Lipid panel in the morning.  Metabolic acidosis- Bicarb reduced- 11, with an anion gap- 19, explained by acute uremia. Delta delta ratio, suggests underlying contributing factor to acidosis- Most likely diarrhoea. - 325mg  of bicarb TID  Microcytic anemia- Hgb- today- 8.3. Down from 10.1 5 months ago.  MCV- 74.9. Not actively bleeding. But previously normal. Likely related to SLE flare. - Iron studies. - Follow up CBC.  HypoCalcemia- Today- 6.0. Appears chronically low. 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 PTH- 09/01/2013- Increased PTH- 150. - 1g of calcium gluconate. - Phosphorus level. - Ionised calcium. - Follow up Bmets. - Nephrology consult, may req dialysis to get rid of phosphate.  DVT ppx- heparin.  Dispo: Disposition is deferred at this time, awaiting improvement of current medical problems.   The patient does not know have a current PCP (No Pcp Per Patient) and does not know need an [facility] hospital follow-up appointment after discharge.  The patient does not know have transportation limitations that hinder transportation to clinic appointments.  Signed: Kennis Carina, MD 01/30/2014, 5:08 AM

## 2014-01-29 NOTE — ED Notes (Signed)
Pt is here with sob, chest pain with laying down, leg swelling, abdominal swelling, anxious, hands cold.

## 2014-01-29 NOTE — ED Notes (Signed)
Patient called x1. Reported that patient is in the cafeteria and will return.

## 2014-01-29 NOTE — ED Notes (Signed)
Pt. Reports "lupus flare-up". States SOB with numbness in fingers and drowsiness. Pt. Denies SOB currently. States "I'm just tired". Pt. Alert and oriented x4.

## 2014-01-29 NOTE — ED Provider Notes (Signed)
CSN: [id number]     Arrival date & time 01/29/14  1743 History   First MD Initiated Contact with Patient 01/29/14 2033     Chief Complaint  Patient presents with  . Shortness of Breath  . Dizziness  . Chest Pain  . Leg Swelling   (Consider location/radiation/quality/duration/timing/severity/associated sxs/prior Treatment) Patient is a 33 y.o. female presenting with shortness of breath, dizziness, and chest pain. The history is provided by the patient.  Shortness of Breath Severity:  Severe Onset quality:  Gradual Duration:  4 days Timing:  Constant Progression:  Worsening Chronicity:  Recurrent Relieved by:  Nothing Worsened by:  Activity Ineffective treatments:  Rest Associated symptoms: chest pain   Associated symptoms: no abdominal pain, no cough, no fever, no sputum production, no vomiting and no wheezing   Associated symptoms comment:  Diffuse swelling in the abd and legs.  Intermittent diarrhea for months but no change Risk factors comment:  Lupus and nephrotic sx. Dizziness Associated symptoms: chest pain and shortness of breath   Associated symptoms: no vomiting   Chest Pain Associated symptoms: dizziness and shortness of breath   Associated symptoms: no abdominal pain, no cough, no fever and not vomiting     Past Medical History  Diagnosis Date  . Lupus (systemic lupus erythematosus)   . Lupus nephritis   . S/P pericardiocentesis 01/17/2013    H/o pericardial effusion with tamponade 2006   . H/O pleural effusion 01/17/2013  . H/O pericarditis 01/17/2013  . Cocaine abuse 01/18/2013   No past surgical history on file. No family history on file. History  Substance Use Topics  . Smoking status: Current Every Day Smoker  . Smokeless tobacco: Not on file  . Alcohol Use: Yes   OB History   Grav Para Term Preterm Abortions TAB SAB Ect Mult Living                 Review of Systems  Constitutional: Negative for fever.  Respiratory: Positive for shortness of  breath. Negative for cough, sputum production and wheezing.   Cardiovascular: Positive for chest pain.  Gastrointestinal: Negative for vomiting and abdominal pain.  Neurological: Positive for dizziness.  All other systems reviewed and are negative.    Allergies  Tobramycin sulfate  Home Medications   Current Outpatient Rx  Name  Route  Sig  Dispense  Refill  . acetaminophen (TYLENOL) 500 MG tablet   Oral   Take 1,000 mg by mouth every 6 (six) hours as needed for pain.         . diphenhydramine-acetaminophen (TYLENOL PM) 25-500 MG TABS   Oral   Take 1 tablet by mouth at bedtime as needed (sleep).           BP 166/126  Pulse 75  Temp(Src) 97.4 F (36.3 C) (Oral)  Resp 22  SpO2 94% Physical Exam  Nursing note and vitals reviewed. Constitutional: She is oriented to person, place, and time. She appears well-developed and well-nourished. No distress.  HENT:  Head: Normocephalic and atraumatic.  Mouth/Throat: Oropharynx is clear and moist.  Eyes: Conjunctivae and EOM are normal. Pupils are equal, round, and reactive to light.  Neck: Normal range of motion. Neck supple.  Cardiovascular: Regular rhythm and intact distal pulses.  Tachycardia present.   No murmur heard. Pulmonary/Chest: Effort normal. No respiratory distress. She has decreased breath sounds in the left lower field. She has no wheezes. She has rales in the right middle field, the right lower field and the left  middle field.  Abdominal: Soft. She exhibits distension. There is no tenderness. There is no rebound and no guarding.  Musculoskeletal: Normal range of motion. She exhibits edema. She exhibits no tenderness.  2+ bilaterally  Neurological: She is alert and oriented to person, place, and time.  Skin: Skin is warm and dry. No rash noted. No erythema.  Psychiatric: She has a normal mood and affect. Her behavior is normal.    ED Course  Procedures (including critical care time) Labs Review Labs Reviewed   CBC - Abnormal; Notable for the following:    RBC 3.27 (*)    Hemoglobin 8.3 (*)    HCT 24.5 (*)    MCV 74.9 (*)    MCH 25.4 (*)    All other components within normal limits  BASIC METABOLIC PANEL - Abnormal; Notable for the following:    CO2 11 (*)    BUN 77 (*)    Creatinine, Ser 13.27 (*)    Calcium 6.0 (*)    GFR calc non Af Amer 3 (*)    GFR calc Af Amer 4 (*)    All other components within normal limits  PRO B NATRIURETIC PEPTIDE - Abnormal; Notable for the following:    Pro B Natriuretic peptide (BNP) 32025.0 (*)    All other components within normal limits  POCT I-STAT TROPONIN I  POCT PREGNANCY, URINE   Imaging Review Dg Chest 2 View  01/29/2014   CLINICAL DATA:  Chest pain and shortness of breath.  EXAM: CHEST  2 VIEW  COMPARISON:  01/17/2013.  FINDINGS: Interval moderate to large-sized left pleural effusion and small right pleural effusion. No gross change borderline enlargement of the cardiac silhouette. Left basilar atelectasis adjacent to the pleural fluid. Unremarkable bones.  IMPRESSION: 1. Interval moderate to large-sized left pleural effusion with adjacent left basilar atelectasis. 2. Interval small right pleural effusion.   Electronically Signed   By: Enrique Sack M.D.   On: 01/29/2014 19:33    EKG Interpretation    Date/Time:  Monday January 29 2014 17:51:39 EST Ventricular Rate:  90 PR Interval:  124 QRS Duration: 70 QT Interval:  388 QTC Calculation: 474 R Axis:   78 Text Interpretation:  Normal sinus rhythm Cannot rule out Anterior infarct , age undetermined T wave abnormality, consider inferolateral ischemia , new T wave inversion Anterior leads Confirmed by Maryan Rued  MD, Lavergne Hiltunen ([id number]) on 01/29/2014 8:33:32 PM            MDM   1. Renal failure   2. Lupus     Patient here complaining of worsening shortness of breath and diffuse generalized edema for the last 4 days. She has a history of lupus and nephrotic syndrome and was last taking any  medications for this 6 months to one year ago. Today patient is found to be in acute renal failure with a creatinine of 14 and fluid overload with bilateral pleural effusions. Patient has T wave inversions in her anterior leads but otherwise EKG is unchanged. She takes no blood pressure medication and has been taking Advil for the last 3 days but has not taken it chronically prior to that. Currently hemodynamically stable repeat blood pressure 160 over 90s. Satting 100% on room air. Will admit for ongoing treatment of her lupus and we'll consult nephrology for evaluation and possible dialysis.  Spoke with Dr. Posey Pronto who will evaluate the pt.  Blanchie Dessert, MD 01/29/14 2158

## 2014-01-30 ENCOUNTER — Encounter (HOSPITAL_COMMUNITY): Payer: Self-pay

## 2014-01-30 ENCOUNTER — Inpatient Hospital Stay (HOSPITAL_COMMUNITY): Payer: Medicaid Other

## 2014-01-30 LAB — CBC WITH DIFFERENTIAL/PLATELET
BASOS ABS: 0 10*3/uL (ref 0.0–0.1)
BASOS PCT: 0 % (ref 0–1)
EOS ABS: 0.1 10*3/uL (ref 0.0–0.7)
Eosinophils Relative: 1 % (ref 0–5)
HEMATOCRIT: 20.3 % — AB (ref 36.0–46.0)
Hemoglobin: 7 g/dL — ABNORMAL LOW (ref 12.0–15.0)
Lymphocytes Relative: 9 % — ABNORMAL LOW (ref 12–46)
Lymphs Abs: 0.6 10*3/uL — ABNORMAL LOW (ref 0.7–4.0)
MCH: 25.5 pg — ABNORMAL LOW (ref 26.0–34.0)
MCHC: 34.5 g/dL (ref 30.0–36.0)
MCV: 73.8 fL — ABNORMAL LOW (ref 78.0–100.0)
MONO ABS: 0.1 10*3/uL (ref 0.1–1.0)
Monocytes Relative: 1 % — ABNORMAL LOW (ref 3–12)
NEUTROS ABS: 5.2 10*3/uL (ref 1.7–7.7)
Neutrophils Relative %: 88 % — ABNORMAL HIGH (ref 43–77)
Platelets: 237 10*3/uL (ref 150–400)
RBC: 2.75 MIL/uL — ABNORMAL LOW (ref 3.87–5.11)
RDW: 15.3 % (ref 11.5–15.5)
WBC: 5.9 10*3/uL (ref 4.0–10.5)

## 2014-01-30 LAB — SEDIMENTATION RATE: Sed Rate: 139 mm/hr — ABNORMAL HIGH (ref 0–22)

## 2014-01-30 LAB — APTT: APTT: 34 s (ref 24–37)

## 2014-01-30 LAB — IRON AND TIBC
Iron: 19 ug/dL — ABNORMAL LOW (ref 42–135)
SATURATION RATIOS: 15 % — AB (ref 20–55)
TIBC: 127 ug/dL — AB (ref 250–470)
UIBC: 108 ug/dL — ABNORMAL LOW (ref 125–400)

## 2014-01-30 LAB — HEPATIC FUNCTION PANEL
ALBUMIN: 1 g/dL — AB (ref 3.5–5.2)
ALK PHOS: 46 U/L (ref 39–117)
ALT: 5 U/L (ref 0–35)
AST: 10 U/L (ref 0–37)
Bilirubin, Direct: <0.2 mg/dL (ref 0.0–0.3)
TOTAL PROTEIN: 6.9 g/dL (ref 6.0–8.3)
Total Bilirubin: <0.2 mg/dL — ABNORMAL LOW (ref 0.3–1.2)

## 2014-01-30 LAB — URINALYSIS, ROUTINE W REFLEX MICROSCOPIC
Bilirubin Urine: NEGATIVE
Glucose, UA: NEGATIVE mg/dL
Ketones, ur: NEGATIVE mg/dL
Leukocytes, UA: NEGATIVE
NITRITE: NEGATIVE
SPECIFIC GRAVITY, URINE: 1.016 (ref 1.005–1.030)
Urobilinogen, UA: 0.2 mg/dL (ref 0.0–1.0)
pH: 6 (ref 5.0–8.0)

## 2014-01-30 LAB — LIPID PANEL
CHOLESTEROL: 190 mg/dL (ref 0–200)
HDL: 25 mg/dL — ABNORMAL LOW (ref >39)
LDL Cholesterol: 116 mg/dL — ABNORMAL HIGH (ref 0–99)
TRIGLYCERIDES: 244 mg/dL — AB (ref <150)
Total CHOL/HDL Ratio: 7.6 RATIO
VLDL: 49 mg/dL — AB (ref 0–40)

## 2014-01-30 LAB — ABO/RH: ABO/RH(D): A POS

## 2014-01-30 LAB — SODIUM, URINE, RANDOM: Sodium, Ur: 76 mEq/L

## 2014-01-30 LAB — TROPONIN I: Troponin I: <0.3 ng/mL (ref <0.30)

## 2014-01-30 LAB — URINE MICROSCOPIC-ADD ON

## 2014-01-30 LAB — COMPREHENSIVE METABOLIC PANEL
ALT: 6 U/L (ref 0–35)
AST: 11 U/L (ref 0–37)
Albumin: 1 g/dL — ABNORMAL LOW (ref 3.5–5.2)
Alkaline Phosphatase: 43 U/L (ref 39–117)
BUN: 78 mg/dL — AB (ref 6–23)
CHLORIDE: 107 meq/L (ref 96–112)
CO2: 9 meq/L — AB (ref 19–32)
Calcium: 6.2 mg/dL — CL (ref 8.4–10.5)
Creatinine, Ser: 13.02 mg/dL — ABNORMAL HIGH (ref 0.50–1.10)
GFR calc Af Amer: 4 mL/min — ABNORMAL LOW (ref >90)
GFR, EST NON AFRICAN AMERICAN: 3 mL/min — AB (ref >90)
Glucose, Bld: 85 mg/dL (ref 70–99)
Potassium: 4.9 mEq/L (ref 3.7–5.3)
Sodium: 136 mEq/L — ABNORMAL LOW (ref 137–147)
Total Protein: 6.9 g/dL (ref 6.0–8.3)

## 2014-01-30 LAB — PROTIME-INR
INR: 1.14 (ref 0.00–1.49)
Prothrombin Time: 14.4 seconds (ref 11.6–15.2)

## 2014-01-30 LAB — PROTEIN / CREATININE RATIO, URINE
Creatinine, Urine: 59.57 mg/dL
Protein Creatinine Ratio: 12 — ABNORMAL HIGH (ref 0.00–0.15)
Total Protein, Urine: 715 mg/dL

## 2014-01-30 LAB — FERRITIN: FERRITIN: 133 ng/mL (ref 10–291)

## 2014-01-30 LAB — MAGNESIUM: Magnesium: 1.5 mg/dL (ref 1.5–2.5)

## 2014-01-30 LAB — PHOSPHORUS: Phosphorus: 10.5 mg/dL — ABNORMAL HIGH (ref 2.3–4.6)

## 2014-01-30 MED ORDER — DIPHENHYDRAMINE HCL 25 MG PO CAPS
25.0000 mg | ORAL_CAPSULE | Freq: Once | ORAL | Status: AC
  Administered 2014-01-30: 25 mg via ORAL
  Filled 2014-01-30: qty 1

## 2014-01-30 MED ORDER — CALCIUM CARBONATE ANTACID 500 MG PO CHEW
5.0000 | CHEWABLE_TABLET | Freq: Three times a day (TID) | ORAL | Status: DC
  Administered 2014-01-30 – 2014-02-03 (×6): 1000 mg via ORAL
  Filled 2014-01-30 (×17): qty 5

## 2014-01-30 MED ORDER — SODIUM BICARBONATE 650 MG PO TABS
1300.0000 mg | ORAL_TABLET | Freq: Two times a day (BID) | ORAL | Status: DC
  Administered 2014-01-30 – 2014-01-31 (×2): 1300 mg via ORAL
  Filled 2014-01-30 (×5): qty 2

## 2014-01-30 NOTE — Progress Notes (Signed)
MD notified of patient's recent blood pressures.   Filed Vitals:   01/29/14 2230 01/29/14 2300 01/29/14 2353 01/30/14 0417  BP: 169/122 156/103 161/114 159/112  Pulse: 92 84 92 86  Temp:   98 F (36.7 C) 98.4 F (36.9 C)  TempSrc:   Oral Oral  Resp:   20 20  Height:   5\' 4"  (1.626 m)   Weight:   56.972 kg (125 lb 9.6 oz)   SpO2: 100% 92% 99% 97%   Per MD, will continue to monitor. No new orders placed at this time. Velora Mediate

## 2014-01-30 NOTE — Progress Notes (Signed)
Patient requesting benadryl for sleep. MD notified.

## 2014-01-30 NOTE — Progress Notes (Signed)
Medical Student Daily Progress Note  Subjective: No events overnight. Patient continues to have shortness of breath, but she reports feeling better after receiving steroid treatment. The erythematous rash on her legs has darkened and less erythematous. She reports dry cough and pressure discomfort in left chest when she coughs. No new lower extremity edema.  Patient reports that she was diagnosed 6 years ago with initial presentation of pericardial effusion. She initially took mycophenolate mofetil, but stopped when the side effects were not tolerable and she became pregnant. 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 She was managed by Dr. Marval Regal at [facility] and Dr. Charlestine Night ([facility]).  Objective: Vital signs in last 24 hours: Filed Vitals:   01/29/14 2300 01/29/14 2353 01/30/14 0417 01/30/14 0925  BP: 156/103 161/114 159/112 163/116  Pulse: 84 92 86 98  Temp:  98 F (36.7 C) 98.4 F (36.9 C) 98.8 F (37.1 C)  TempSrc:  Oral Oral   Resp:  $Remo'20 20 21  'qkHwZ$ Height:  $Remove'5\' 4"'VgbOLhg$  (1.626 m)    Weight:  56.972 kg (125 lb 9.6 oz)    SpO2: 92% 99% 97% 98%   Weight change:   Intake/Output Summary (Last 24 hours) at 01/30/14 1057 Last data filed at 01/30/14 0925  Gross per 24 hour  Intake    320 ml  Output      0 ml  Net    320 ml   Physical Exam: BP 163/116  Pulse 98  Temp(Src) 98.8 F (37.1 C) (Oral)  Resp 21  Ht $R'5\' 4"'ML$  (1.626 m)  Wt 56.972 kg (125 lb 9.6 oz)  BMI 21.55 kg/m2  SpO2 98%  LMP 11/27/2013 General appearance: alert, appears stated age and no distress Head: Normocephalic, without obvious abnormality, atraumatic Eyes: Conjunctiva clear, PERRLA, EOMI Throat: Tongue with hypopigmented macule on right side Neck: no adenopathy, no JVD, supple, symmetrical, trachea midline and thyroid not enlarged, symmetric, no tenderness/mass/nodules Back: symmetric, no curvature. ROM normal. No CVA tenderness. Lungs: diminished breath sounds base - left and  LLL, dullness to percussion base - left and LLL, egophony base - left and LLL and no crackles or rhonchi Heart: S1, S2 normal, no rub and tachycardia Abdomen: soft, non-tender; bowel sounds normal; no masses,  no organomegaly Extremities: edema in bilaterally feet, 1+ bilaterally Skin: circular hypopigmented patches with dark edge found from ankle to knee in calf portion in R and L. Neurologic: Alert and oriented X 3, normal strength and tone. Normal symmetric reflexes. Normal coordination and gait Lab Results: $RemoveBefo'@labtest2'KRHIKkbdTpr$ @ Micro Results: No results found for this or any previous visit (from the past 240 hour(s)). Studies/Results: Dg Chest 2 View  01/29/2014   CLINICAL DATA:  Chest pain and shortness of breath.  EXAM: CHEST  2 VIEW  COMPARISON:  01/17/2013.  FINDINGS: Interval moderate to large-sized left pleural effusion and small right pleural effusion. No gross change borderline enlargement of the cardiac silhouette. Left basilar atelectasis adjacent to the pleural fluid. Unremarkable bones.  IMPRESSION: 1. Interval moderate to large-sized left pleural effusion with adjacent left basilar atelectasis. 2. Interval small right pleural effusion.   Electronically Signed   By: Enrique Sack M.D.   On: 01/29/2014 19:33   Medications:  Scheduled Meds: . heparin  5,000 Units Subcutaneous Q8H  . methylPREDNISolone (SOLU-MEDROL) injection  1,000 mg Intravenous Q24H  . sodium bicarbonate  325 mg Oral TID   Continuous Infusions:  PRN Meds:.  Labs:   CBC 01/30/2014  Results for  orders placed during the hospital encounter of 01/29/14  CBC      Result Value Range   WBC 6.5  4.0 - 10.5 K/uL   RBC 3.27 (*) 3.87 - 5.11 MIL/uL   Hemoglobin 8.3 (*) 12.0 - 15.0 g/dL   HCT 24.5 (*) 36.0 - 46.0 %   MCV 74.9 (*) 78.0 - 100.0 fL   MCH 25.4 (*) 26.0 - 34.0 pg   MCHC 33.9  30.0 - 36.0 g/dL   RDW 15.3  11.5 - 15.5 %   Platelets 304  150 - 400 K/uL  BASIC METABOLIC PANEL      Result Value Range   Sodium 137   137 - 147 mEq/L   Potassium 5.2  3.7 - 5.3 mEq/L   Chloride 107  96 - 112 mEq/L   CO2 11 (*) 19 - 32 mEq/L   Glucose, Bld 82  70 - 99 mg/dL   BUN 77 (*) 6 - 23 mg/dL   Creatinine, Ser 13.27 (*) 0.50 - 1.10 mg/dL   Calcium 6.0 (*) 8.4 - 10.5 mg/dL   GFR calc non Af Amer 3 (*) >90 mL/min   GFR calc Af Amer 4 (*) >90 mL/min  PRO B NATRIURETIC PEPTIDE      Result Value Range   Pro B Natriuretic peptide (BNP) 32025.0 (*) 0 - 125 pg/mL  COMPREHENSIVE METABOLIC PANEL      Result Value Range   Sodium 136 (*) 137 - 147 mEq/L   Potassium 4.9  3.7 - 5.3 mEq/L   Chloride 107  96 - 112 mEq/L   CO2 9 (*) 19 - 32 mEq/L   Glucose, Bld 85  70 - 99 mg/dL   BUN 78 (*) 6 - 23 mg/dL   Creatinine, Ser 13.02 (*) 0.50 - 1.10 mg/dL   Calcium 6.2 (*) 8.4 - 10.5 mg/dL   Total Protein 6.9  6.0 - 8.3 g/dL   Albumin 1.0 (*) 3.5 - 5.2 g/dL   AST 11  0 - 37 U/L   ALT 6  0 - 35 U/L   Alkaline Phosphatase 43  39 - 117 U/L   Total Bilirubin <0.2 (*) 0.3 - 1.2 mg/dL   GFR calc non Af Amer 3 (*) >90 mL/min   GFR calc Af Amer 4 (*) >90 mL/min  MAGNESIUM      Result Value Range   Magnesium 1.5  1.5 - 2.5 mg/dL  PHOSPHORUS      Result Value Range   Phosphorus 10.5 (*) 2.3 - 4.6 mg/dL  CBC WITH DIFFERENTIAL      Result Value Range   WBC 5.9  4.0 - 10.5 K/uL   RBC 2.75 (*) 3.87 - 5.11 MIL/uL   Hemoglobin 7.0 (*) 12.0 - 15.0 g/dL   HCT 20.3 (*) 36.0 - 46.0 %   MCV 73.8 (*) 78.0 - 100.0 fL   MCH 25.5 (*) 26.0 - 34.0 pg   MCHC 34.5  30.0 - 36.0 g/dL   RDW 15.3  11.5 - 15.5 %   Platelets 237  150 - 400 K/uL   Neutrophils Relative % 88 (*) 43 - 77 %   Neutro Abs 5.2  1.7 - 7.7 K/uL   Lymphocytes Relative 9 (*) 12 - 46 %   Lymphs Abs 0.6 (*) 0.7 - 4.0 K/uL   Monocytes Relative 1 (*) 3 - 12 %   Monocytes Absolute 0.1  0.1 - 1.0 K/uL   Eosinophils Relative 1  0 - 5 %  Eosinophils Absolute 0.1  0.0 - 0.7 K/uL   Basophils Relative 0  0 - 1 %   Basophils Absolute 0.0  0.0 - 0.1 K/uL  URINALYSIS, ROUTINE W  REFLEX MICROSCOPIC      Result Value Range   Color, Urine YELLOW  YELLOW   APPearance CLEAR  CLEAR   Specific Gravity, Urine 1.016  1.005 - 1.030   pH 6.0  5.0 - 8.0   Glucose, UA NEGATIVE  NEGATIVE mg/dL   Hgb urine dipstick MODERATE (*) NEGATIVE   Bilirubin Urine NEGATIVE  NEGATIVE   Ketones, ur NEGATIVE  NEGATIVE mg/dL   Protein, ur >300 (*) NEGATIVE mg/dL   Urobilinogen, UA 0.2  0.0 - 1.0 mg/dL   Nitrite NEGATIVE  NEGATIVE   Leukocytes, UA NEGATIVE  NEGATIVE  PROTIME-INR      Result Value Range   Prothrombin Time 14.4  11.6 - 15.2 seconds   INR 1.14  0.00 - 1.49  HEPATIC FUNCTION PANEL      Result Value Range   Total Protein 6.9  6.0 - 8.3 g/dL   Albumin 1.0 (*) 3.5 - 5.2 g/dL   AST 10  0 - 37 U/L   ALT 5  0 - 35 U/L   Alkaline Phosphatase 46  39 - 117 U/L   Total Bilirubin <0.2 (*) 0.3 - 1.2 mg/dL   Bilirubin, Direct <0.2  0.0 - 0.3 mg/dL   Indirect Bilirubin NOT CALCULATED  0.3 - 0.9 mg/dL  APTT      Result Value Range   aPTT 34  24 - 37 seconds  SEDIMENTATION RATE      Result Value Range   Sed Rate 139 (*) 0 - 22 mm/hr  PROTEIN / CREATININE RATIO, URINE      Result Value Range   Creatinine, Urine 59.57     Total Protein, Urine 715     PROTEIN CREATININE RATIO 12.00 (*) 0.00 - 0.15  SODIUM, URINE, RANDOM      Result Value Range   Sodium, Ur 76    IRON AND TIBC      Result Value Range   Iron 19 (*) 42 - 135 ug/dL   TIBC 127 (*) 250 - 470 ug/dL   Saturation Ratios 15 (*) 20 - 55 %   UIBC 108 (*) 125 - 400 ug/dL  FERRITIN      Result Value Range   Ferritin 133  10 - 291 ng/mL  LIPID PANEL      Result Value Range   Cholesterol 190  0 - 200 mg/dL   Triglycerides 244 (*) <150 mg/dL   HDL 25 (*) >39 mg/dL   Total CHOL/HDL Ratio 7.6     VLDL 49 (*) 0 - 40 mg/dL   LDL Cholesterol 116 (*) 0 - 99 mg/dL  TROPONIN I      Result Value Range   Troponin I <0.30  <0.30 ng/mL  URINE MICROSCOPIC-ADD ON      Result Value Range   Squamous Epithelial / LPF RARE  RARE    WBC, UA 0-2  <3 WBC/hpf   RBC / HPF 0-2  <3 RBC/hpf   Bacteria, UA RARE  RARE  POCT I-STAT TROPONIN I      Result Value Range   Troponin i, poc 0.01  0.00 - 0.08 ng/mL   Comment 3           POCT PREGNANCY, URINE      Result Value Range   Preg Test, Ur  NEGATIVE  NEGATIVE     Assessment/Plan: Principal Problem:   Lupus nephritis Active Problems:   Acute systemic lupus erythematosus   Acute renal failure   Microcytic anemia   Hypocalcemia   Acute uremia   LOS: 1 day   [name] is a 33yo woman with history of lupus nephritis untreated for 5 years who presents with acute renal failure and pleural effusion all likely from lupus nephritis flare up.   1. Acute Renal Failure with FEna of 12.2% in the setting of untreated lupus nephritis for 5 years: Intrarenal cause of acute renal failure most likely due to lupus nephritis flare. Cr of 13.27 and BUN of 77 on admission with foamy urine, UPC of 12, low albumin, proteinuria, acidemia (bicarb of 9 with anion gap of 20), hyperphosphatemia (phos 10.5), hypocalcemia (Ca 6.0), and anemia. HIV and Hepatitis cause of kidney diseases are also possible, but HIV antibody and Hepatitis panel are still pending. Please see individual problems for management. -Renal ultrasound: for sclerotic kidnies or post renal obstruction; biopsy only if kidneys are normal in size. Consider dialysis if indicated and if kidneys are sclerotic and cannot be treated.  -Renal diet -Strict I/Os -Daily renal labs, and CBC -NPO overnight for possible procedures in AM of 01/31/14 -HIV antibody -Hepatitis panel pending  2. SLE and lupus nephritis flare: patient has a history of lupus nephritis and has not been treated for 5 years. 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  -C3-C4 levels pending -Methylprednisolone sodium succinate 1,000mg  in NS 63mL IVPB -Hold  mycophenolate pending status of renal ultrasound/possible biopsy (urine pregnancy is negative should this medication is to be used)  3. Pleural Effusion: CXR shows significant left pleural effusion, and she reports feeling pressure in the left chest. Could be caused by lupus nephritis flare up, or due to acute renal failure. LFTs are normal (AST 11, ALT 6). Patient has reported feeling less short of breath after being treated with steroids, which points more to lupus nephritis as the cause.  -Diagnostic and therapeutic thoracentesis  4. Microcytic Anemia: likely from lupus nephritis, but Hgb has been dropping (Hgb 7.0 on 01/30/14 from 8.3 on 01/29/14, and from 10.1 on 09/01/13). MCV of 76. Iron panel shows low iron, elevated ferritin, but normal TIBC. This is most likely anemia of chronic disease with some iron deficiency anemia. -Type and screen if hemoglobin drops or patient experiences symptoms of anemia -Reticulocyte count -Monitor patient for symptoms of anemia  5. Acidosis: bicarb of 9 with anion gap of 20, delta-delta ratio of 0.3 indicating both anion gap and non anion gap metabolic acidosis present. Renal inability to retain bicarb is likely the cause.  -1300mg  sodium bicarbonate tablet BID to correct metabolic acidosis with anion gap  6. Hyperphosphatemia and hypocalcemia: from chronic kidney disease -Calcium carbonate 1000mg  qAC   Prophylaxis:  -DVT: intermittent pneumatic compression  This is a Careers information officer Note.  The care of the patient was discussed with Dr. Tommy Medal and the assessment and plan formulated with their assistance.  Please see their attached note for official documentation of the daily encounter.  Konrad Saha 01/30/2014, 10:57 AM

## 2014-01-30 NOTE — Progress Notes (Signed)
  I have seen and examined the patient, and reviewed the daily progress note by Konrad Saha, MS IV and discussed the care of the patient with them. Please see my progress note from 01/30/2014 for further details regarding assessment and plan.    Signed:  Othella Boyer, MD 01/30/2014, 4:58 PM

## 2014-01-30 NOTE — H&P (Signed)
  Date: 01/30/2014  Patient name: [name]  Medical record number: [id number]  Date of birth: [birth year]   I have seen and evaluated Pecola Lawless and discussed their care with the Residency Team.   Assessment and Plan: I have seen and evaluated the patient as outlined above. I agree with the formulated Assessment and Plan as detailed in the residents' admission note, with the following changes:   33 year old lady with known lupus and history of lupus nephritis, who fell out of care having previously been followed by Dr. Charlestine Night with rheumatology.  She is admitted with acute renal failure, with AGAP metabolic acidosis, hypocalcemia and hyperphosphatemia uremia. bilateral pleural effusions and new rash on her legs .  See pictures:         We have started high-dose corticosteroids to treat her lupus.  We've given her calcium intravenously.  We have  consult with nephrology and Dr. Joelyn Oms has graciously seen the patient. He recommends obtaining a renal biopsy to distinguish between an acute reversible nephritis versus scarred, permanently damaged kidneys and will ask IR to obtain renal biopsy.  I agree that pt may benefit from diagnostic and more importantly therapeutic thoracocentesis, in particular if she is not going to get any imminent dialysis that might help reduce the presence of this effusion.   will also consider IR guided thoracentesis with fluid to be sent for LDH protein albumin cell count differential and culture for bacteria AFB and fungi.  The patient tasted negative for HIV and hepatitis B in September of 2014.     Truman Hayward, [place] 2/3/20151:21 PM

## 2014-01-30 NOTE — Progress Notes (Signed)
CRITICAL VALUE ALERT  Critical value received:  CO2 of 9, Calcium of 6.2  Date of notification:  01/30/14  Time of notification:  07:58  Critical value read back:yes  Nurse who received alert:  Virgilio Frees   MD notified (1st page):  [facility] pager [phone]  Time of first page:  07:59  MD notified (2nd page):  Time of second page:  Responding MD: IMTS pager [phone]  Time MD responded:  08:00

## 2014-01-30 NOTE — Progress Notes (Signed)
[name] [id number] Admitted to 6E06: 01/30/2014 12:15 AM Attending Provider: Truman Hayward, MD   History:  obtained from the patient.  Pt orientation to unit, room and routine. Information packet given to patient and safety video watched.  Admission INP armband ID verified with patient and in place. Side rails in place, fall risk assessment complete with patient verbalizing understanding of risks associated with falls. Pt verbalizes an understanding of how to use the call bell and to call for help before getting out of bed.   Will cont to monitor and assist as needed.  Velora Mediate, RN 01/30/2014 12:15 AM

## 2014-01-30 NOTE — Consult Note (Signed)
[name] 01/30/2014 [name], B Requesting Physician:  [name] Med  Reason for Consult:  Renal Failure, Lupus Nephritis, Acidosis HPI:  63F w/ hx/o IIIa/V SLE NEphritis from renal biopsy 2006 off all medications for past 4-5years admitted yesterday with dyspnea, some edema, and found to have worsened renal failure, acidosis, pleural effusion on L side.    Pt has decided against consistent follow up for her SLE because she "procrastinates". She has had foamy urine, dyspnea, swelling i nlegs lately but denies F/C, hemoptysis, nasal/oral sores, unusual arthralgias, CP.  She noticed a rash, blotchy, across her legs in December, initially red but now darkened and without new lesions.    She takes occ APAP, but no prescription medications. She has occasional N/V, no loss of appeitite, food tastes no different, no hiccups or itching.    On 2V CXR at admission found to have L sided pleural effusion  Creatinine (mg/dL)  Date Value  01/18/2013 1.38*     Creatinine, Ser (mg/dL)  Date Value  01/30/2014 13.02*  01/29/2014 13.27*  09/01/2013 1.94*  08/31/2013 2.20*  01/21/2013 2.09*  01/20/2013 1.81*  01/19/2013 1.38*  01/18/2013 1.78*  01/17/2013 1.91*  01/17/2013 2.18*  ] I/Os: I/O last 3 completed shifts: In: 120 [P.O.:120] Out: -    ROS NSAIDS: no clear consistent exposure IV Contrast: no exposure TMP/SMX: no exposure Hypotension: none identified Balance of 12 systems is negative w/ exceptions as above  PMH  Past Medical History  Diagnosis Date  . Lupus (systemic lupus erythematosus)     Previously followed with Dr. Charlestine [name], has not followed up recently  . Lupus nephritis 2006    Renal biopsy shows segmental endocapillary proliferation and cellular crescent formation (Class IIIA) and lupus membranous glomerulopathy (Class V, stage II)  . S/P pericardiocentesis 01/17/2013    H/o pericardial effusion with tamponade 2006   . H/O pleural effusion 01/17/2013  . H/O pericarditis  01/17/2013  . Polysubstance abuse     cocaine, MJ, tobacco   PSH History reviewed. No pertinent past surgical history. FH History reviewed. No pertinent family history. SH  reports that she has been smoking.  She does not have any smokeless tobacco history on file. She reports that she drinks alcohol. She reports that she uses illicit drugs (Marijuana and Cocaine) about 5 times per week.  Allergies  Allergies  Allergen Reactions  . Tobramycin Sulfate Swelling   Home medications Prior to Admission medications   Medication Sig Start Date End Date Taking? Authorizing Provider  acetaminophen (TYLENOL) 500 MG tablet Take 1,000 mg by mouth every 6 (six) hours as needed for pain.   Yes Historical Provider, MD  diphenhydramine-acetaminophen (TYLENOL PM) 25-500 MG TABS Take 1 tablet by mouth at bedtime as needed (sleep).    Yes Historical Provider, MD    Current Medications Current Facility-Administered Medications  Medication Dose Route Frequency Provider Last Rate Last Dose  . heparin injection 5,000 Units  5,000 Units Subcutaneous Q8H [name] Emokpae, MD   5,000 Units at 01/30/14 0535  . methylPREDNISolone sodium succinate (SOLU-MEDROL) 1,000 mg in sodium chloride 0.9 % 50 mL IVPB  1,000 mg Intravenous Q24H [name] Emokpae, MD   1,000 mg at 01/30/14 0303  . mycophenolate (CELLCEPT) capsule 500 mg  500 mg Oral BID [name] Emokpae, MD   500 mg at 01/30/14 1008  . sodium bicarbonate tablet 325 mg  325 mg Oral TID [name] Downer, MD   325 mg at 01/30/14 1008    CBC  Recent  Labs Lab 01/29/14 1818 01/30/14 0616  WBC 6.5 5.9  NEUTROABS  --  5.2  HGB 8.3* 7.0*  HCT 24.5* 20.3*  MCV 74.9* 73.8*  PLT 304 342   Basic Metabolic Panel  Recent Labs Lab 01/29/14 1818 01/30/14 0020 01/30/14 0616  NA 137  --  136*  K 5.2  --  4.9  CL 107  --  107  CO2 11*  --  9*  GLUCOSE 82  --  85  BUN 77*  --  78*  CREATININE 13.27*  --  13.02*  CALCIUM 6.0*  --  6.2*  PHOS  --  10.5*   --     Physical Exam  Blood pressure 163/116, pulse 98, temperature 98.8 F (37.1 C), temperature source Oral, resp. rate 21, height $RemoveBe'5\' 4"'NNqzwDSHS$  (1.626 m), weight 56.972 kg (125 lb 9.6 oz), last menstrual period 11/27/2013, SpO2 98.00%. GEN: NAD ENT: poor dentition, no oral or nasal ulcers EYES: EOMI, no injection CV: RRR, no rub.  Nl s1s2.  PULM: diminished BS in L base, +egophany.  R sided CTA.  Nl WOB ABD: s/nt/nd. No abd bruits SKIN: hyperpigmented lesion in interior portions of b/l calves.  No erythema.  Nontender EXT: 1+ pitting edema b/l.   NEURO: nonfocal.  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  She also has a large L pleural effusion, inc AG metabolic acidosis, nephrotic proteinuria with hypoalbuminemia, hyperphosphatemia.    1. AoCKD: Need to sort out if she has an active renal process (proliferative GN) vs Insterstial Fibrosis / Atrophy (burned out kidney) to guide therapy choices.  Renal US to assess kidney size and if normal sized pt should have renal biopsy tomorrow.  Hold heparin.  Not particularly uremic at this time and volume status is acceptable.  Would prefer to procede with renal biopsy and see if any effect of corticosteroids before RRT.  Hold on dialysis catheter currently pending the above issues.  Very well could need RRT during this hospitalization. 2. SLE Nephritis;  Cont solumedrol pulse x3d then transition to $RemoveBefor'60mg'cVZylplhphKy$  daily.  Hold MMF pending results of other studies.  Agree with C3, C4, UP/C.   3. Nephrotic Syndrome, Hypoalbuminemia: as above.  Volume status ok.  No ACEi.   4. ? 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 5. Inc AG Met Acidosis: Chronically present.  Recommend change to 1300 BID NaHCO3 and titrate to serum HCO3>22.  If starts HD will not necessarily need. 6. L pleural effusion: per IM, might need thoracentesis for symptoms 7. Anemia: pending renal  biopsy, rec Transfuse 2u PRBC today 8. Hyperphosphatemia and hypocalcemia (partially driven by low albumin): Start Tums 1gm qAC.  Renal diet.  Not an indication for dialysis at this time.    [name] Grippe MD 01/30/2014, 10:37 AM

## 2014-01-30 NOTE — Progress Notes (Addendum)
Subjective: No complaints, reports chest pain is improved, feeling well, aware of bx tomorrow  Objective: Vital signs in last 24 hours: Filed Vitals:   01/29/14 2353 01/30/14 0417 01/30/14 0925 01/30/14 1253  BP: 161/114 159/112 163/116 167/120  Pulse: 92 86 98 89  Temp: 98 F (36.7 C) 98.4 F (36.9 C) 98.8 F (37.1 C) 98.6 F (37 C)  TempSrc: Oral Oral    Resp: _0 Height: _1  (1.626 m)     Weight: 125 lb 9.6 oz (56.972 kg)     SpO2: 99% 97% 98% 95%   Weight change:   Intake/Output Summary (Last 24 hours) at 01/30/14 1426 Last data filed at 01/30/14 1347  Gross per 24 hour  Intake    440 ml  Output    500 ml  Net    -60 ml   General: resting in bed, no acute distress HEENT: PERRL, EOMI, no scleral icterus Cardiac: RRR, no rubs, murmurs or gallops Pulm: clear to auscultation bilaterally, moving normal volumes of air Abd: soft, nontender, nondistended, BS normoactive Ext: warm and well perfused, trace pretibial pitting edema  Skin: livido reticularis of b/l LE Neuro: alert and oriented X3, cranial nerves II-XII grossly intact  Lab Results: Basic Metabolic Panel:  Recent Labs Lab 01/29/14 1818 01/30/14 0020 01/30/14 0616  NA 137  --  136*  K 5.2  --  4.9  CL 107  --  107  CO2 11*  --  9*  GLUCOSE 82  --  85  BUN 77*  --  78*  CREATININE 13.27*  --  13.02*  CALCIUM 6.0*  --  6.2*  MG  --  1.5  --   PHOS  --  10.5*  --   AG 20  Liver Function Tests:  Recent Labs Lab 01/30/14 0020 01/30/14 0616  AST 10 11  ALT 5 6  ALKPHOS 46 43  BILITOT <0.2* <0.2*  PROT 6.9 6.9  ALBUMIN 1.0* 1.0*   CBC:  Recent Labs Lab 01/29/14 1818 01/30/14 0616  WBC 6.5 5.9  NEUTROABS  --  5.2  HGB 8.3* 7.0*  HCT 24.5* 20.3*  MCV 74.9* 73.8*  PLT 304 237   Coagulation:  Recent Labs Lab 01/30/14 0020  LABPROT 14.4  INR 1.14   Anemia Panel:  Recent Labs Lab 01/30/14 0020  FERRITIN 133  TIBC 127*  IRON 19*   Urinalysis:  Recent  Labs Lab 01/30/14 1141  COLORURINE YELLOW  LABSPEC 1.016  PHURINE 6.0  GLUCOSEU NEGATIVE  HGBUR MODERATE*  BILIRUBINUR NEGATIVE  KETONESUR NEGATIVE  PROTEINUR >300*  UROBILINOGEN 0.2  NITRITE NEGATIVE  LEUKOCYTESUR NEGATIVE   Studies/Results: Dg Chest 2 View  01/29/2014   CLINICAL DATA:  Chest pain and shortness of breath.  EXAM: CHEST  2 VIEW  COMPARISON:  01/17/2013.  FINDINGS: Interval moderate to large-sized left pleural effusion and small right pleural effusion. No gross change borderline enlargement of [name] cardiac silhouette. Left basilar atelectasis adjacent to [name] pleural fluid. Unremarkable bones.  IMPRESSION: 1. Interval moderate to large-sized left pleural effusion with adjacent left basilar atelectasis. 2. Interval small right pleural effusion.   Electronically Signed   By: Enrique Sack M.D.   On: 01/29/2014 19:33   Medications: I have reviewed [name]'s current medications. Scheduled Meds: . heparin  5,000 Units Subcutaneous Q8H  . methylPREDNISolone (SOLU-MEDROL) injection  1,000 mg Intravenous Q24H  . sodium bicarbonate  325 mg Oral TID   Continuous Infusions:  PRN  Meds:.  Assessment/Plan: [name] is a 33 yo woman with history of SLE & lupus nephritis admitted on 01/29/14 with chest pain, found to be in acute lupus flare - chest pain has since improved.   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 Prot/Cr = 12. FEna 12%, LDL 116 -Follow C3, C4, FeNa, renal US, anti - dsDNA -hold immunosuppressive tx , urine preg negative -Cont pulse dose steroids x 3d (today is 2/3) --> then prednisone 31m daily -Plan for renal bx to determine chronicity if normal kidneys on [place]- will help decide regarding RRT (appreciate renal input) --> NPO after midnight  -strict I/Os -Regarding acidosis, NaHCO3 supp with  1300 bid, goal bicarb >22 -Regarding hypoCa, hyperPhos, start tums 1g AC -follow HIV and hep panel  #Left sided Pleural effusion: symptoms improved, but given this is a new pleural effusion, would benefit from IR guided thoracentesis --> overload vs secondary to lupus (improved with steroids) -->NPO after midnight  #Microcytic anemia : Hb 8.3 --> 7, [name] is asymptomatic at this time, ferritin 133, but iron 19, no active s/s of bleeding, likely related to ACD d/t renal failure, no menstrual cycle in 2 months; stool earlier today was brown -Still pending abd [place] will hold blood transfusion for now, but transfuse in AM if Hb<7, type and screen -FOBT (though less likely)  #SCD: d/c heparin for procedure, SCDs, [name] ambulatory  --> remainder per MS[facility] note  Dispo: Disposition is deferred at this time, awaiting improvement of current medical problems.  Anticipated discharge in approximately 2-3 day(s).   [name] does not have a current PCP (No Pcp Per [name]) and may need an OYpsilantifollow-up appointment after discharge.  [name] does not have transportation limitations that hinder transportation to clinic appointments.  .Services Needed at time of discharge: Y = Yes, Blank = No PT:   OT:   RN:   Equipment:   Other:     LOS: 1 day   NOthella Boyer MD 01/30/2014, 2:26 PM

## 2014-01-31 ENCOUNTER — Inpatient Hospital Stay (HOSPITAL_COMMUNITY): Payer: Medicaid Other

## 2014-01-31 ENCOUNTER — Encounter (HOSPITAL_COMMUNITY): Payer: Self-pay | Admitting: Radiology

## 2014-01-31 ENCOUNTER — Encounter (HOSPITAL_COMMUNITY)
Admission: EM | Disposition: A | Discharge status: Home / Self Care | Payer: Self-pay | Source: Home / Self Care | Attending: Infectious Disease

## 2014-01-31 DIAGNOSIS — J90 Pleural effusion, not elsewhere classified: Secondary | ICD-10-CM | POA: Diagnosis present

## 2014-01-31 DIAGNOSIS — N186 End stage renal disease: Secondary | ICD-10-CM | POA: Diagnosis present

## 2014-01-31 DIAGNOSIS — E872 Acidosis: Secondary | ICD-10-CM | POA: Diagnosis present

## 2014-01-31 DIAGNOSIS — Z992 Dependence on renal dialysis: Secondary | ICD-10-CM

## 2014-01-31 DIAGNOSIS — E8729 Other acidosis: Secondary | ICD-10-CM | POA: Diagnosis present

## 2014-01-31 HISTORY — PX: VENOGRAM: SHX5497

## 2014-01-31 LAB — CBC
HCT: 19.6 % — ABNORMAL LOW (ref 36.0–46.0)
HEMATOCRIT: 28.3 % — AB (ref 36.0–46.0)
Hemoglobin: 6.8 g/dL — CL (ref 12.0–15.0)
Hemoglobin: 10.2 g/dL — ABNORMAL LOW (ref 12.0–15.0)
MCH: 25.4 pg — ABNORMAL LOW (ref 26.0–34.0)
MCH: 27.8 pg (ref 26.0–34.0)
MCHC: 34.7 g/dL (ref 30.0–36.0)
MCHC: 36 g/dL (ref 30.0–36.0)
MCV: 73.1 fL — ABNORMAL LOW (ref 78.0–100.0)
MCV: 77.1 fL — AB (ref 78.0–100.0)
PLATELETS: 248 10*3/uL (ref 150–400)
Platelets: 274 10*3/uL (ref 150–400)
RBC: 2.68 MIL/uL — ABNORMAL LOW (ref 3.87–5.11)
RBC: 3.67 MIL/uL — ABNORMAL LOW (ref 3.87–5.11)
RDW: 15 % (ref 11.5–15.5)
RDW: 15.5 % (ref 11.5–15.5)
WBC: 3.4 10*3/uL — AB (ref 4.0–10.5)
WBC: 10.9 10*3/uL — ABNORMAL HIGH (ref 4.0–10.5)

## 2014-01-31 LAB — C4 COMPLEMENT: COMPLEMENT C4, BODY FLUID: 32 mg/dL (ref 10–40)

## 2014-01-31 LAB — RETICULOCYTES
RBC.: 2.68 MIL/uL — AB (ref 3.87–5.11)
RETIC COUNT ABSOLUTE: 26.8 10*3/uL (ref 19.0–186.0)
Retic Ct Pct: 1 % (ref 0.4–3.1)

## 2014-01-31 LAB — HIV ANTIBODY (ROUTINE TESTING W REFLEX): HIV: NONREACTIVE

## 2014-01-31 LAB — RENAL FUNCTION PANEL
Albumin: 1.2 g/dL — ABNORMAL LOW (ref 3.5–5.2)
BUN: 85 mg/dL — AB (ref 6–23)
CALCIUM: 6.7 mg/dL — AB (ref 8.4–10.5)
CO2: 10 mEq/L — CL (ref 19–32)
Chloride: 105 mEq/L (ref 96–112)
Creatinine, Ser: 13.22 mg/dL — ABNORMAL HIGH (ref 0.50–1.10)
GFR calc Af Amer: 4 mL/min — ABNORMAL LOW (ref >90)
GFR calc non Af Amer: 3 mL/min — ABNORMAL LOW (ref >90)
GLUCOSE: 125 mg/dL — AB (ref 70–99)
PHOSPHORUS: 10.2 mg/dL — AB (ref 2.3–4.6)
Potassium: 5.3 mEq/L (ref 3.7–5.3)
Sodium: 135 mEq/L — ABNORMAL LOW (ref 137–147)

## 2014-01-31 LAB — HEPATITIS PANEL, ACUTE
HCV Ab: NEGATIVE
HEP A IGM: NONREACTIVE
Hep B C IgM: NONREACTIVE
Hepatitis B Surface Ag: NEGATIVE

## 2014-01-31 LAB — C3 COMPLEMENT: C3 COMPLEMENT: 59 mg/dL — AB (ref 90–180)

## 2014-01-31 LAB — ANTI-DNA ANTIBODY, DOUBLE-STRANDED: DS DNA AB: 4 [IU]/mL

## 2014-01-31 LAB — OCCULT BLOOD X 1 CARD TO LAB, STOOL: Fecal Occult Bld: NEGATIVE

## 2014-01-31 LAB — PREPARE RBC (CROSSMATCH)

## 2014-01-31 SURGERY — VENOGRAM
Anesthesia: LOCAL | Laterality: Right

## 2014-01-31 MED ORDER — AMLODIPINE BESYLATE 5 MG PO TABS
5.0000 mg | ORAL_TABLET | Freq: Every day | ORAL | Status: DC
  Administered 2014-01-31: 5 mg via ORAL
  Filled 2014-01-31 (×2): qty 1

## 2014-01-31 MED ORDER — LIDOCAINE HCL (PF) 1 % IJ SOLN
INTRAMUSCULAR | Status: AC
  Filled 2014-01-31: qty 30

## 2014-01-31 MED ORDER — MIDAZOLAM HCL 2 MG/2ML IJ SOLN
INTRAMUSCULAR | Status: AC
  Filled 2014-01-31: qty 2

## 2014-01-31 MED ORDER — HEPARIN SODIUM (PORCINE) 1000 UNIT/ML IJ SOLN
INTRAMUSCULAR | Status: AC
  Filled 2014-01-31: qty 1

## 2014-01-31 MED ORDER — DARBEPOETIN ALFA-POLYSORBATE 60 MCG/0.3ML IJ SOLN
60.0000 ug | INTRAMUSCULAR | Status: DC
  Administered 2014-01-31 – 2014-02-08 (×3): 60 ug via INTRAVENOUS
  Filled 2014-01-31 (×2): qty 0.3

## 2014-01-31 MED ORDER — ACETAMINOPHEN 325 MG PO TABS
650.0000 mg | ORAL_TABLET | Freq: Four times a day (QID) | ORAL | Status: DC | PRN
  Administered 2014-02-01 – 2014-02-03 (×4): 650 mg via ORAL
  Filled 2014-01-31 (×4): qty 2

## 2014-01-31 MED ORDER — FENTANYL CITRATE 0.05 MG/ML IJ SOLN
INTRAMUSCULAR | Status: AC
  Filled 2014-01-31: qty 2

## 2014-01-31 MED ORDER — HYDRALAZINE HCL 20 MG/ML IJ SOLN
5.0000 mg | INTRAMUSCULAR | Status: DC | PRN
  Administered 2014-01-31: 5 mg via INTRAVENOUS
  Filled 2014-01-31: qty 1

## 2014-01-31 MED ORDER — DARBEPOETIN ALFA-POLYSORBATE 60 MCG/0.3ML IJ SOLN
INTRAMUSCULAR | Status: AC
  Filled 2014-01-31: qty 0.3

## 2014-01-31 MED ORDER — ACETAMINOPHEN 325 MG PO TABS
650.0000 mg | ORAL_TABLET | Freq: Three times a day (TID) | ORAL | Status: DC | PRN
  Administered 2014-01-31: 650 mg via ORAL
  Filled 2014-01-31: qty 2

## 2014-01-31 MED ORDER — HEPARIN (PORCINE) IN NACL 2-0.9 UNIT/ML-% IJ SOLN
INTRAMUSCULAR | Status: AC
  Filled 2014-01-31: qty 500

## 2014-01-31 MED ORDER — CLONIDINE HCL 0.1 MG PO TABS
0.1000 mg | ORAL_TABLET | Freq: Four times a day (QID) | ORAL | Status: DC | PRN
  Filled 2014-01-31: qty 1

## 2014-01-31 NOTE — Progress Notes (Signed)
Admit: 01/29/2014 LOS: 2  76F w/ hx/o IIIa/V SLE Nephritis (Bx 2006), CKD (BL SCr ~2 --> 13) not adherent to treatment x4-5y admitted with AKI, SCr 13, UP/C 12.  Pt also with L sided pleural effusion, dyspnea,   Subjective:  NAEON Hungry NPO Plan for [facility] this AM SCr not different s/p pulse solumedrol initiation  Tentative plan for thoracentesis as well Started Tums and NaHCO3 yesterday S/p Solumedrol 1gm IV x2 Urine preg test negative Renal US: normal sized, inc echogenicity, no obstruction  02/03 0701 - 02/04 0700 In: 680 [P.O.:680] Out: 500 [Urine:500]  Filed Weights   01/29/14 2353 01/30/14 1700  Weight: 56.972 kg (125 lb 9.6 oz) 59.92 kg (132 lb 1.6 oz)    Current meds: reviewed  Current Labs: reviewed    Physical Exam:  Blood pressure 148/99, pulse 91, temperature 97.2 F (36.2 C), temperature source Oral, resp. rate 18, height 5\' 4"  (1.626 m), weight 59.92 kg (132 lb 1.6 oz), last menstrual period 11/27/2013, SpO2 96.00%. NAD, awake, alert, appropriate RRR.  No rub. Nl s1s2 BS diminshed in L lower field.  CTA otherwise Trace to 1+ pitting edema Rash on legs unchanged Nonfocal, MAE NCAT  Assessment/Plan 1. 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  Plan today for [facility] placement with VVS followed by 1st iHD treatment.  This will help acidosis and can transfuse with RRT.  Postpone Bx for 1d to stabilize.  Pt at least RRT depedent in short term, very possible permanently 2. SLE Nephritis: Pt would benefit from biopsy to clarify acuity/chronicity.  Today will be third dose of pulse solumedrol.  Pt should transition to 60mg  prednisone tomorrow.  Hold other immunusuppression pending biopsy results.  C3 and C4 pending.   3. Nephrotic Proteiniuria: as above. 4. Microcytic Anemia: Some suggestion of iron def and multifactorial including loss of GFR and SLE.  Transfuse today.  Will start ESA with treatment (Aranesp 60 qWed).  Follow iron levels  after transfusion. 5. Metabolic Acidosis: HD today.  Can stop NaHCO3 tabs 6. L pleural effusion: agree with thoracentesis.  Unsure how well iHD will address 7. Hyperphosphatemia: on Tums 1qAC.  HD today.  Check PTH.    Pearson Grippe MD 01/31/2014, 9:17 AM   Recent Labs Lab 01/29/14 1818 01/30/14 0020 01/30/14 0616 01/31/14 0536  NA 137  --  136* 135*  K 5.2  --  4.9 5.3  CL 107  --  107 105  CO2 11*  --  9* 10*  GLUCOSE 82  --  85 125*  BUN 77*  --  78* 85*  CREATININE 13.27*  --  13.02* 13.22*  CALCIUM 6.0*  --  6.2* 6.7*  PHOS  --  10.5*  --  10.2*    Recent Labs Lab 01/29/14 1818 01/30/14 0616 01/31/14 0536  WBC 6.5 5.9 3.4*  NEUTROABS  --  5.2  --   HGB 8.3* 7.0* 6.8*  HCT 24.5* 20.3* 19.6*  MCV 74.9* 73.8* 73.1*  PLT 304 237 274

## 2014-01-31 NOTE — H&P (View-Only) (Signed)
For diatek today

## 2014-01-31 NOTE — Progress Notes (Signed)
CRITICAL VALUE ALERT  Critical value received:  CO2 10  Date of notification:  01/31/14  Time of notification:  06:48  Critical value read back: yes  Nurse who received alert:  Doreene Burke  MD notified (1st page):    Time of first page:  06:55  MD notified (2nd page):  Time of second page:  Responding MD:    Time MD responded:  6:56

## 2014-01-31 NOTE — Progress Notes (Signed)
Consulted with Konrad Saha regarding pt's continuously elevated BP. Received instructions to continue to monitor pt for symptoms such as blurred vision, chest pain and SOB and notify on-call MD of symptoms. Also instructed to notify on-call MD of BPs higher than 180/120.

## 2014-01-31 NOTE — Progress Notes (Signed)
  Date: 01/31/2014  Patient name: [name]  Medical record number: [id number]  Date of birth: [birth year]   This patient has been seen and the plan of care was discussed with the house staff. Please see their note for complete details. I concur with their findings with the following additions/corrections:  Greatly appreciate Nephrology and VVS help here  Patient for HD catheter today, blood transfusion and HD  She will have renal biopsy in the am  I think she would benefit from diagnostic and therapeutic thoracocentesis as well.    Truman Hayward, MD 01/31/2014, 11:45 AM

## 2014-01-31 NOTE — Op Note (Signed)
Procedure: Ultrasound-guided insertion of Diatek catheter  Preoperative diagnosis: End-stage renal disease  Postoperative diagnosis: Same  Anesthesia: Local with IV sedation  Operative findings: 21 cm Diatek catheter right internal jugular vein  Operative details: After obtaining informed consent, the patient was taken to the cath lab room 8. The patient was placed in supine position on the operating room table. After adequate sedation the patient's entire neck and chest were prepped and draped in usual sterile fashion. The patient was placed in Trendelenburg position. Ultrasound was used to identify the patient's right internal jugular vein. This had normal compressibility and respiratory variation. Local anesthesia was infiltrated over the right jugular vein.  Using ultrasound guidance, the right internal jugular vein was successfully cannulated.  A 0.035 J-tipped guidewire was threaded into the right internal jugular vein and into the superior vena cava followed by the inferior vena cava under fluoroscopic guidance.   Next sequential 12 and 14 dilators were placed over the guidewire into the right atrium.  A 16 French dilator with a peel-away sheath was then placed over the guidewire into the right atrium.   The guidewire and dilator were removed. A 21 cm Diatek catheter was then placed through the peel away sheath into the right atrium.  The catheter was then tunneled subcutaneously, cut to length, and the hub attached. The catheter was noted to flush and draw easily. The catheter was inspected under fluoroscopy and found with its tip to be in the right atrium without any kinks throughout its course. The catheter was sutured to the skin with nylon sutures. The neck insertion site was closed with Vicryl stitch. The catheter was then loaded with concentrated Heparin solution. A dry sterile dressing was applied.  The patient tolerated procedure well and there were no complications. Instrument sponge and  needle counts correct in the case. The patient was taken to the recovery room in stable condition. Chest x-ray will be obtained in the recovery room.  Annamarie Major, MD [facility] Office: [phone] Pager: [phone]

## 2014-01-31 NOTE — Procedures (Signed)
I was present at this dialysis session. I have reviewed the session itself and made appropriate changes.   Pearson Grippe  MD 01/31/2014, 2:22 PM

## 2014-01-31 NOTE — Progress Notes (Signed)
CRITICAL VALUE ALERT  Critical value received:  Hgb 6.8  Date of notification:  01/31/14  Time of notification:  06:53  Critical value read back: yes  Nurse who received alert:  Doreene Burke  MD notified (1st page):  yes  Time of first page:  06:55  MD notified (2nd page):  Time of second page:  Responding MD:  yes  Time MD responded:  06:56

## 2014-01-31 NOTE — Interval H&P Note (Signed)
History and Physical Interval Note:  01/31/2014 9:55 AM  [name]  has presented today for surgery, with the diagnosis of hemodialysis  The various methods of treatment have been discussed with the patient and family. After consideration of risks, benefits and other options for treatment, the patient has consented to  Procedure(s): DIALYSIS CATHETER (N/A) as a surgical intervention .  The patient's history has been reviewed, patient examined, no change in status, stable for surgery.  I have reviewed the patient's chart and labs.  Questions were answered to the patient's satisfaction.     Jernie Schutt IV, V. WELL[facility]

## 2014-01-31 NOTE — H&P (View-Only) (Signed)
[facility]  Reason for Consult:  HD cath placement MRN #:  [id number]  History of Present Illness: This is a 33 y.o. female who was admitted a couple of days ago with SOB and some edema.  On admission, she was found to have worsening renal function.  She does have a hx of SLE nephritis.  She has a hx of renal bx in 2006.   VVS is asked to see pt for HD catheter placement.  The pt states she is to go for a kidney bx today, but is to get a blood transfusion first and can't go until this is completed.  She denies any hx of CVA, CP, DM or MI.  Past Medical History  Diagnosis Date  . Lupus (systemic lupus erythematosus)     Previously followed with Dr. Charlestine Night, has not followed up recently  . Lupus nephritis 2006    Renal biopsy shows segmental endocapillary proliferation and cellular crescent formation (Class IIIA) and lupus membranous glomerulopathy (Class V, stage II)  . S/P pericardiocentesis 01/17/2013    H/o pericardial effusion with tamponade 2006   . H/O pleural effusion 01/17/2013  . H/O pericarditis 01/17/2013  . Polysubstance abuse     cocaine, MJ, tobacco   History reviewed. No pertinent past surgical history.  Allergies  Allergen Reactions  . Tobramycin Sulfate Swelling    Prior to Admission medications   Medication Sig Start Date End Date Taking? Authorizing Provider  acetaminophen (TYLENOL) 500 MG tablet Take 1,000 mg by mouth every 6 (six) hours as needed for pain.   Yes Historical Provider, MD  diphenhydramine-acetaminophen (TYLENOL PM) 25-500 MG TABS Take 1 tablet by mouth at bedtime as needed (sleep).    Yes Historical Provider, MD    History   Social History  . Marital Status: Single    Spouse Name: N/A    Number of Children: N/A  . Years of Education: N/A   Occupational History  . Not on file.   Social History Main Topics  . Smoking status: Current Every Day Smoker -- 0.25 packs/day for 15 years  . Smokeless tobacco: Not  on file  . Alcohol Use: Yes     Comment: "binge drinking" most weekends, per pt report  . Drug Use: 5.00 per week    Special: Marijuana, Cocaine  . Sexual Activity: Yes   Other Topics Concern  . Not on file   Social History Narrative  . No narrative on file     ROS: [x]  Positive   [ ]  Negative   [ ]  All sytems reviewed and are negative  Cardiovascular: []  chest pain/pressure []  palpitations [x]  SOB  [x DOE []  pain in legs while walking []  pain in legs at rest []  pain in legs at night []  non-healing ulcers []  hx of DVT [x]  swelling in legs  Pulmonary: []  productive cough []  asthma/wheezing []  home O2  Neurologic: []  weakness in []  arms []  legs []  numbness in []  arms []  legs []  hx of CVA []  mini stroke [] difficulty speaking or slurred speech []  temporary loss of vision in one eye []  dizziness  Hematologic: []  hx of cancer []  bleeding problems []  problems with blood clotting easily  Endocrine:   []  diabetes []  thyroid disease  GI []  vomiting blood []  blood in stool  GU: [x]  CKD/SJE nephritis/renal failure []  HD--[]  M/W/F or []  T/T/S []  burning with urination []  blood in urine  Psychiatric: []  anxiety []  depression  Musculoskeletal: []  arthritis []   joint pain  Integumentary: []  rashes []  ulcers  Constitutional: []  fever []  chills   Physical Examination  Filed Vitals:   01/31/14 0547  BP: 148/99  Pulse: 91  Temp: 97.2 F (36.2 C)  Resp: 18   Body mass index is 22.66 kg/(m^2).  General:  WDWN in NAD Gait: Not observed HENT: WNL, normocephalic Eyes: Pupils equal Pulmonary: normal non-labored breathing, without Rales, rhonchi,  wheezing Cardiac: regular; Abdomen: soft, NT/ND, no masses Skin: without rashes, without ulcers  Vascular Exam/Pulses:+ palpable radial pulses bilaterally; + palpable DP pulses bilatera. Extremities: without ischemic changes, without Gangrene , without cellulitis; without open wounds;  Musculoskeletal: no  muscle wasting or atrophy  Neurologic: A&O X 3; Appropriate Affect ; SENSATION: normal; MOTOR FUNCTION:  moving all extremities equally. Speech is fluent/normal   CBC    Component Value Date/Time   WBC 3.4* 01/31/2014 0536   RBC 2.68* 01/31/2014 0536   RBC 2.68* 01/31/2014 0536   HGB 6.8* 01/31/2014 0536   HCT 19.6* 01/31/2014 0536   PLT 274 01/31/2014 0536   MCV 73.1* 01/31/2014 0536   MCH 25.4* 01/31/2014 0536   MCHC 34.7 01/31/2014 0536   RDW 15.0 01/31/2014 0536   LYMPHSABS 0.6* 01/30/2014 0616   MONOABS 0.1 01/30/2014 0616   EOSABS 0.1 01/30/2014 0616   BASOSABS 0.0 01/30/2014 0616    BMET    Component Value Date/Time   NA 135* 01/31/2014 0536   K 5.3 01/31/2014 0536   CL 105 01/31/2014 0536   CO2 10* 01/31/2014 0536   GLUCOSE 125* 01/31/2014 0536   BUN 85* 01/31/2014 0536   CREATININE 13.22* 01/31/2014 0536   CREATININE 1.38* 01/18/2013 1700   CALCIUM 6.7* 01/31/2014 0536   CALCIUM 7.6* 09/01/2013 1434   GFRNONAA 3* 01/31/2014 0536   GFRAA 4* 01/31/2014 0536    COAGS: Lab Results  Component Value Date   INR 1.14 01/30/2014     Non-Invasive Vascular Imaging:  none  Statin:  no Beta Blocker:  no Aspirin:  no ACEI:  no ARB:  no Other antiplatelets/anticoagulants:  no   ASSESSMENT: This is a 33 y.o. female with worsening renal failure due to SLE nephritis.  PLAN: -pt is NPO for kidney bx today.  She is to get a blood transfusion this am and unable to get kidney bx until this afternoon.  Will d/w attending to see if and when catheter can be placed today.   Leontine Locket, PA-C [facility] [phone]

## 2014-01-31 NOTE — Progress Notes (Signed)
Subjective: Denies any complaints today. Asks for meals due feeling thirsty and hungry. She has been NPO since last night pending renal biopsy.  Objective: Vital signs in last 24 hours: Filed Vitals:   01/30/14 1700 01/30/14 2100 01/31/14 0547 01/31/14 0900  BP:  169/114 148/99 160/113  Pulse:  86 91 85  Temp:  98.8 F (37.1 C) 97.2 F (36.2 C) 97.9 F (36.6 C)  TempSrc:  Oral Oral Oral  Resp:  _0 Height:      Weight: 132 lb 1.6 oz (59.92 kg)     SpO2:  100% 96% 100%   Weight change: 6 lb 8 oz (2.948 kg)  Intake/Output Summary (Last 24 hours) at 01/31/14 1142 Last data filed at 01/31/14 0900  Gross per 24 hour  Intake    480 ml  Output    500 ml  Net    -20 ml   General: resting in bed, no acute distress. Looks comfortable.  Cardiac: RRR, no rubs, murmurs or gallops Pulm: reduced air movement in the lower left lung zones, dull percussion on the left posterior. Air movent is normal on right side. Not in respiratory distress at rest.  Abd: soft, nontender, nondistended, BS normoactive Ext: warm and well perfused, trace pretibial pitting edema  Skin: livido reticularis of b/l LE Neuro: alert and oriented X3, cranial nerves II-XII grossly intact  Lab Results: Basic Metabolic Panel:  Recent Labs Lab 01/29/14 1818 01/30/14 0020 01/30/14 0616 01/31/14 0536  NA 137  --  136* 135*  K 5.2  --  4.9 5.3  CL 107  --  107 105  CO2 11*  --  9* 10*  GLUCOSE 82  --  85 125*  BUN 77*  --  78* 85*  CREATININE 13.27*  --  13.02* 13.22*  CALCIUM 6.0*  --  6.2* 6.7*  MG  --  1.5  --   --   PHOS  --  10.5*  --  10.2*  AG 20  Liver Function Tests:  Recent Labs Lab 01/30/14 0020 01/30/14 0616 01/31/14 0536  AST 10 11  --   ALT 5 6  --   ALKPHOS 46 43  --   BILITOT <0.2* <0.2*  --   PROT 6.9 6.9  --   ALBUMIN 1.0* 1.0* 1.2*   CBC:  Recent Labs Lab 01/30/14 0616 01/31/14 0536  WBC 5.9 3.4*  NEUTROABS 5.2  --   HGB 7.0* 6.8*  HCT 20.3* 19.6*  MCV 73.8*  73.1*  PLT 237 274   Coagulation:  Recent Labs Lab 01/30/14 0020  LABPROT 14.4  INR 1.14   Anemia Panel:  Recent Labs Lab 01/30/14 0020 01/31/14 0536  FERRITIN 133  --   TIBC 127*  --   IRON 19*  --   RETICCTPCT  --  1.0   Urinalysis:  Recent Labs Lab 01/30/14 1141  COLORURINE YELLOW  LABSPEC 1.016  PHURINE 6.0  GLUCOSEU NEGATIVE  HGBUR MODERATE*  BILIRUBINUR NEGATIVE  KETONESUR NEGATIVE  PROTEINUR >300*  UROBILINOGEN 0.2  NITRITE NEGATIVE  LEUKOCYTESUR NEGATIVE   Studies/Results: Dg Chest 2 View  01/29/2014   CLINICAL DATA:  Chest pain and shortness of breath.  EXAM: CHEST  2 VIEW  COMPARISON:  01/17/2013.  FINDINGS: Interval moderate to large-sized left pleural effusion and small right pleural effusion. No gross change borderline enlargement of the cardiac silhouette. Left basilar atelectasis adjacent to the pleural fluid. Unremarkable bones.  IMPRESSION: 1. Interval moderate to large-sized  left pleural effusion with adjacent left basilar atelectasis. 2. Interval small right pleural effusion.   Electronically Signed   By: Enrique Sack M.D.   On: 01/29/2014 19:33   US Renal  01/30/2014   CLINICAL DATA:  Acute renal insufficiency.  EXAM: RENAL/URINARY TRACT ULTRASOUND COMPLETE  COMPARISON:  01/17/2013  FINDINGS: Right Kidney:  Length: 11.6 cm. Marked increased echogenicity of the renal parenchyma since the prior study. No hydronephrosis. There is a small amount of fluid in the perinephric space.  Left Kidney  Length: 10.1 cm. Diffuse increased echogenicity of the kidney, markedly increased since the prior exam. There is small amount of perinephric fluid around the kidney.  Bladder:  Ascites around the normal appearing bladder.  IMPRESSION: Marked increased echogenicity of the renal parenchyma since the prior study of 01/17/2013, consistent with renal medical disease.  Ascites.  Small amount of perinephric fluid bilaterally, nonspecific.   Electronically Signed   By: Rozetta Nunnery M.D.   On: 01/30/2014 20:50   Medications: I have reviewed the patient's current medications. Scheduled Meds: . calcium carbonate  5 tablet Oral TID AC  . darbepoetin (ARANESP) injection - DIALYSIS  60 mcg Intravenous Q Wed-HD  . methylPREDNISolone (SOLU-MEDROL) injection  1,000 mg Intravenous Q24H  . sodium bicarbonate  1,300 mg Oral BID   Continuous Infusions:  PRN Meds:.  Assessment/Plan: Ms. Fazzino is a 33 yo woman with history of SLE & lupus nephritis admitted on 01/29/14 with chest pain, found to be in acute lupus flare - chest pain has since improved.   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 creatinine remains elevated. Also Elevated ESR, complicated by hypocalcemia, hyperphosphatemia and acidosis.  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 Renal u/s reveals progressive renal disease. Prot/Cr = 12. FEna 12%, LDL 116 Plan  - appreciate nephrology in put.  - Diatek HD catheter right IJ placed today - RRT today and possibly permanent per renal  - cont with IV solumedrol but hold other  immunosuppressive tx -Cont pulse dose steroids x 3d (today is 3/3) --> then prednisone 24m daily tomorrow -cont with NaHCO3 supp with 1300 bid, goal bicarb >22, HCO2 today =10 - started Aranesp (60 qWed) today   -Regarding hypoCa, hyperPhos, cont with tums 1g AC - renal biopsy to clarify acuity/chronicity will be planned    #Left sided Pleural effusion: symptoms improved and O2 sats are stable. Etiology is likely renal failure. Patient does not appear volume overloaded.  Plan  - thoracentesis, both therapeutic and diagnostic. This will most likely be performed tomorrow given multiple procedures already planned today.   #Microcytic anemia : Hb 8.3 --> 7, patient is asymptomatic at this time, ferritin 133, but iron 19, no active s/s of bleeding, likely related to ACD d/t renal failure, no menstrual cycle in 2 months; no hx of melena or bloody  stools.   Plan  - ESA as above  -- transfused with 2 units of PRBC with HD  #SCD: d/c heparin for procedure, SCDs, patient ambulatory  --> remainder per [facility] note  Dispo: Disposition is deferred at this time, awaiting improvement of current medical problems.  Anticipated discharge in approximately 2-3 day(s).   The patient does not have a current PCP (No Pcp Per Patient) [place].  The patient does not have transportation limitations that hinder transportation to clinic appointments.  .Services Needed at time of discharge: Y = Yes, Blank = No PT:  OT:   RN:   Equipment:   Other:     LOS: 2 days   Signed:  Jessee Avers, MD PGY-2 Internal Medicine Teaching Service Pager: [phone] 01/31/2014, 11:59 AM

## 2014-01-31 NOTE — Consult Note (Signed)
[facility]  Reason for Consult:  HD cath placement MRN #:  [id number]  History of Present Illness: This is a 33 y.o. female who was admitted a couple of days ago with SOB and some edema.  On admission, she was found to have worsening renal function.  She does have a hx of SLE nephritis.  She has a hx of renal bx in 2006.   [facility] is asked to see pt for HD catheter placement.  The pt states she is to go for a kidney bx today, but is to get a blood transfusion first and can't go until this is completed.  She denies any hx of CVA, CP, DM or MI.  Past Medical History  Diagnosis Date  . Lupus (systemic lupus erythematosus)     Previously followed with Dr. Charlestine Night, has not followed up recently  . Lupus nephritis 2006    Renal biopsy shows segmental endocapillary proliferation and cellular crescent formation (Class IIIA) and lupus membranous glomerulopathy (Class V, stage II)  . S/P pericardiocentesis 01/17/2013    H/o pericardial effusion with tamponade 2006   . H/O pleural effusion 01/17/2013  . H/O pericarditis 01/17/2013  . Polysubstance abuse     cocaine, MJ, tobacco   History reviewed. No pertinent past surgical history.  Allergies  Allergen Reactions  . Tobramycin Sulfate Swelling    Prior to Admission medications   Medication Sig Start Date End Date Taking? Authorizing Provider  acetaminophen (TYLENOL) 500 MG tablet Take 1,000 mg by mouth every 6 (six) hours as needed for pain.   Yes Historical Provider, MD  diphenhydramine-acetaminophen (TYLENOL PM) 25-500 MG TABS Take 1 tablet by mouth at bedtime as needed (sleep).    Yes Historical Provider, MD    History   Social History  . Marital Status: Single    Spouse Name: N/A    Number of Children: N/A  . Years of Education: N/A   Occupational History  . Not on file.   Social History Main Topics  . Smoking status: Current Every Day Smoker -- 0.25 packs/day for 15 years  . Smokeless tobacco: Not  on file  . Alcohol Use: Yes     Comment: "binge drinking" most weekends, per pt report  . Drug Use: 5.00 per week    Special: Marijuana, Cocaine  . Sexual Activity: Yes   Other Topics Concern  . Not on file   Social History Narrative  . No narrative on file     ROS: [x]  Positive   [ ]  Negative   [ ]  All sytems reviewed and are negative  Cardiovascular: []  chest pain/pressure []  palpitations [x]  SOB  [x DOE []  pain in legs while walking []  pain in legs at rest []  pain in legs at night []  non-healing ulcers []  hx of DVT [x]  swelling in legs  Pulmonary: []  productive cough []  asthma/wheezing []  home O2  Neurologic: []  weakness in []  arms []  legs []  numbness in []  arms []  legs []  hx of CVA []  mini stroke [] difficulty speaking or slurred speech []  temporary loss of vision in one eye []  dizziness  Hematologic: []  hx of cancer []  bleeding problems []  problems with blood clotting easily  Endocrine:   []  diabetes []  thyroid disease  GI []  vomiting blood []  blood in stool  GU: [x]  CKD/SJE nephritis/renal failure []  HD--[]  M/W/F or []  T/T/S []  burning with urination []  blood in urine  Psychiatric: []  anxiety []  depression  Musculoskeletal: []  arthritis []   joint pain  Integumentary: []  rashes []  ulcers  Constitutional: []  fever []  chills   Physical Examination  Filed Vitals:   01/31/14 0547  BP: 148/99  Pulse: 91  Temp: 97.2 F (36.2 C)  Resp: 18   Body mass index is 22.66 kg/(m^2).  General:  WDWN in NAD Gait: Not observed HENT: WNL, normocephalic Eyes: Pupils equal Pulmonary: normal non-labored breathing, without Rales, rhonchi,  wheezing Cardiac: regular; Abdomen: soft, NT/ND, no masses Skin: without rashes, without ulcers  Vascular Exam/Pulses:+ palpable radial pulses bilaterally; + palpable DP pulses bilatera. Extremities: without ischemic changes, without Gangrene , without cellulitis; without open wounds;  Musculoskeletal: no  muscle wasting or atrophy  Neurologic: A&O X 3; Appropriate Affect ; SENSATION: normal; MOTOR FUNCTION:  moving all extremities equally. Speech is fluent/normal   CBC    Component Value Date/Time   WBC 3.4* 01/31/2014 0536   RBC 2.68* 01/31/2014 0536   RBC 2.68* 01/31/2014 0536   HGB 6.8* 01/31/2014 0536   HCT 19.6* 01/31/2014 0536   PLT 274 01/31/2014 0536   MCV 73.1* 01/31/2014 0536   MCH 25.4* 01/31/2014 0536   MCHC 34.7 01/31/2014 0536   RDW 15.0 01/31/2014 0536   LYMPHSABS 0.6* 01/30/2014 0616   MONOABS 0.1 01/30/2014 0616   EOSABS 0.1 01/30/2014 0616   BASOSABS 0.0 01/30/2014 0616    BMET    Component Value Date/Time   NA 135* 01/31/2014 0536   K 5.3 01/31/2014 0536   CL 105 01/31/2014 0536   CO2 10* 01/31/2014 0536   GLUCOSE 125* 01/31/2014 0536   BUN 85* 01/31/2014 0536   CREATININE 13.22* 01/31/2014 0536   CREATININE 1.38* 01/18/2013 1700   CALCIUM 6.7* 01/31/2014 0536   CALCIUM 7.6* 09/01/2013 1434   GFRNONAA 3* 01/31/2014 0536   GFRAA 4* 01/31/2014 0536    COAGS: Lab Results  Component Value Date   INR 1.14 01/30/2014     Non-Invasive Vascular Imaging:  none  Statin:  no Beta Blocker:  no Aspirin:  no ACEI:  no ARB:  no Other antiplatelets/anticoagulants:  no   ASSESSMENT: This is a 33 y.o. female with worsening renal failure due to SLE nephritis.  PLAN: -pt is NPO for kidney bx today.  She is to get a blood transfusion this am and unable to get kidney bx until this afternoon.  Will d/w attending to see if and when catheter can be placed today.   Leontine Locket, PA-C [facility] [phone]

## 2014-01-31 NOTE — Progress Notes (Signed)
Utilization review completed.  

## 2014-01-31 NOTE — Progress Notes (Signed)
Patient's BP 192/103, HR 70, pt complain of pain at HD catheter site placed today.   MD notified. Will continue to monitor.

## 2014-01-31 NOTE — Consult Note (Signed)
For diatek today

## 2014-01-31 NOTE — H&P (Addendum)
Chief Complaint: "foamy urine and swelling." Referring Physician: Dr. Joelyn Oms HPI: [name] is an 33 y.o. female with history of SLE nephritis with a renal biopsy 2006, she presented with leg swelling, shortness of breath, fatigue, LE rash and bubbles in her urine. The patient had a renal US and labs revealed >300 urine protein and SCr 13 with a baseline of 2, nephrology has evaluated the patient and is requesting an image guided renal biopsy for clarification of acuity/chronicity. The patient states her symptoms have been present for atleast 1 month. She denies any chest pain or use of blood thinners. She denies any active bleeding, fever or chills. She denies any difficulty with previous sedation.  Past Medical History:  Past Medical History  Diagnosis Date  . Lupus (systemic lupus erythematosus)     Previously followed with Dr. Charlestine Night, has not followed up recently  . Lupus nephritis 2006    Renal biopsy shows segmental endocapillary proliferation and cellular crescent formation (Class IIIA) and lupus membranous glomerulopathy (Class V, stage II)  . S/P pericardiocentesis 01/17/2013    H/o pericardial effusion with tamponade 2006   . H/O pleural effusion 01/17/2013  . H/O pericarditis 01/17/2013  . Polysubstance abuse     cocaine, MJ, tobacco    Past Surgical History: History reviewed. No pertinent past surgical history.  Family History: History reviewed. No pertinent family history.  Social History:  reports that she has been smoking.  She does not have any smokeless tobacco history on file. She reports that she drinks alcohol. She reports that she uses illicit drugs (Marijuana and Cocaine) about 5 times per week.  Allergies:  Allergies  Allergen Reactions  . Tobramycin Sulfate Swelling      Medication List    ASK your doctor about these medications       acetaminophen 500 MG tablet  Commonly known as:  TYLENOL  Take 1,000 mg by mouth every 6 (six) hours as needed for  pain.     diphenhydramine-acetaminophen 25-500 MG Tabs  Commonly known as:  TYLENOL PM  Take 1 tablet by mouth at bedtime as needed (sleep).       Please HPI for pertinent positives, otherwise complete 10 system ROS negative.  Physical Exam: BP 160/113  Pulse 85  Temp(Src) 97.9 F (36.6 C) (Oral)  Resp 16  Ht _0  (1.626 m)  Wt 132 lb 1.6 oz (59.92 kg)  BMI 22.66 kg/m2  SpO2 100%  LMP 12/27/2013 Body mass index is 22.66 kg/(m^2).  General Appearance:  Alert, cooperative, no distress  Head:  Normocephalic, without obvious abnormality, atraumatic  Neck: Supple, symmetrical, trachea midline  Lungs:   Diminished left lung field, no w/r/r, respirations unlabored without use of accessory muscles.  Chest Wall:  No tenderness, HD catheter intact  Heart:  Regular rate and rhythm, S1, S2 normal, no murmur, rub or gallop.  Abdomen:   Soft, non-tender, non distended, (+) BS  Extremities: Extremities trace edema, atraumatic, no cyanosis.  Pulses: 2+ and symmetric  Neurologic: Normal affect, no gross deficits.   Results for orders placed during the hospital encounter of 01/29/14 (from the past 48 hour(s))  CBC     Status: Abnormal   Collection Time    01/29/14  6:18 PM      Result Value Range   WBC 6.5  4.0 - 10.5 K/uL   RBC 3.27 (*) 3.87 - 5.11 MIL/uL   Hemoglobin 8.3 (*) 12.0 - 15.0 g/dL   HCT 24.5 (*) 36.0 -  46.0 %   MCV 74.9 (*) 78.0 - 100.0 fL   MCH 25.4 (*) 26.0 - 34.0 pg   MCHC 33.9  30.0 - 36.0 g/dL   RDW 15.3  11.5 - 15.5 %   Platelets 304  150 - 400 K/uL  BASIC METABOLIC PANEL     Status: Abnormal   Collection Time    01/29/14  6:18 PM      Result Value Range   Sodium 137  137 - 147 mEq/L   Potassium 5.2  3.7 - 5.3 mEq/L   Chloride 107  96 - 112 mEq/L   CO2 11 (*) 19 - 32 mEq/L   Glucose, Bld 82  70 - 99 mg/dL   BUN 77 (*) 6 - 23 mg/dL   Creatinine, Ser 13.27 (*) 0.50 - 1.10 mg/dL   Calcium 6.0 (*) 8.4 - 10.5 mg/dL   Comment: CRITICAL RESULT CALLED TO, READ  BACK BY AND VERIFIED WITH:     K Danella Penton 01/29/14 1915 01/29/14 WBOND   GFR calc non Af Amer 3 (*) >90 mL/min   GFR calc Af Amer 4 (*) >90 mL/min   Comment: (NOTE)     The eGFR has been calculated using the CKD EPI equation.     This calculation has not been validated in all clinical situations.     eGFR's persistently <90 mL/min signify possible Chronic Kidney     Disease.  PRO B NATRIURETIC PEPTIDE     Status: Abnormal   Collection Time    01/29/14  6:18 PM      Result Value Range   Pro B Natriuretic peptide (BNP) 32025.0 (*) 0 - 125 pg/mL  POCT I-STAT TROPONIN I     Status: None   Collection Time    01/29/14  6:36 PM      Result Value Range   Troponin i, poc 0.01  0.00 - 0.08 ng/mL   Comment 3            Comment: Due to the release kinetics of cTnI,     a negative result within the first hours     of the onset of symptoms does not rule out     myocardial infarction with certainty.     If myocardial infarction is still suspected,     repeat the test at appropriate intervals.  POCT PREGNANCY, URINE     Status: None   Collection Time    01/29/14  7:00 PM      Result Value Range   Preg Test, Ur NEGATIVE  NEGATIVE   Comment:            THE SENSITIVITY OF THIS     METHODOLOGY IS >24 mIU/mL  MAGNESIUM     Status: None   Collection Time    01/30/14 12:20 AM      Result Value Range   Magnesium 1.5  1.5 - 2.5 mg/dL  PHOSPHORUS     Status: Abnormal   Collection Time    01/30/14 12:20 AM      Result Value Range   Phosphorus 10.5 (*) 2.3 - 4.6 mg/dL  PROTIME-INR     Status: None   Collection Time    01/30/14 12:20 AM      Result Value Range   Prothrombin Time 14.4  11.6 - 15.2 seconds   INR 1.14  0.00 - 1.49  HEPATIC FUNCTION PANEL     Status: Abnormal   Collection Time    01/30/14 12:20 AM  Result Value Range   Total Protein 6.9  6.0 - 8.3 g/dL   Albumin 1.0 (*) 3.5 - 5.2 g/dL   AST 10  0 - 37 U/L   ALT 5  0 - 35 U/L   Alkaline Phosphatase 46  39 - 117 U/L    Total Bilirubin <0.2 (*) 0.3 - 1.2 mg/dL   Bilirubin, Direct <0.2  0.0 - 0.3 mg/dL   Indirect Bilirubin NOT CALCULATED  0.3 - 0.9 mg/dL  APTT     Status: None   Collection Time    01/30/14 12:20 AM      Result Value Range   aPTT 34  24 - 37 seconds  SEDIMENTATION RATE     Status: Abnormal   Collection Time    01/30/14 12:20 AM      Result Value Range   Sed Rate 139 (*) 0 - 22 mm/hr  C3 COMPLEMENT     Status: Abnormal   Collection Time    01/30/14 12:20 AM      Result Value Range   C3 Complement 59 (*) 90 - 180 mg/dL   Comment: Performed at [place]     Status: None   Collection Time    01/30/14 12:20 AM      Result Value Range   Complement C4, Body Fluid 32  10 - 40 mg/dL   Comment: Performed at [place], DOUBLE-STRANDED     Status: None   Collection Time    01/30/14 12:20 AM      Result Value Range   ds DNA Ab 4     Comment: (NOTE)                                  IU/mL       Interpretation                                  < or = 4    Negative                                  5-9         Indeterminate                                  > or = 10   Positive     ** Please note change in reference range(s). **     Performed at [facility]     Status: Abnormal   Collection Time    01/30/14 12:20 AM      Result Value Range   Iron 19 (*) 42 - 135 ug/dL   TIBC 127 (*) 250 - 470 ug/dL   Saturation Ratios 15 (*) 20 - 55 %   UIBC 108 (*) 125 - 400 ug/dL   Comment: Performed at [facility]     Status: None   Collection Time    01/30/14 12:20 AM      Result Value Range   Ferritin 133  10 - 291 ng/mL   Comment: Performed at [facility]     Status: Abnormal  Collection Time    01/30/14  6:16 AM      Result Value Range   Sodium 136 (*) 137 - 147 mEq/L   Potassium 4.9  3.7 - 5.3 mEq/L   Chloride 107  96 - 112 mEq/L   CO2 9 (*) 19 - 32  mEq/L   Comment: CRITICAL RESULT CALLED TO, READ BACK BY AND VERIFIED WITH:     LARSON A RN 01/30/14 0757 COSTELLO B   Glucose, Bld 85  70 - 99 mg/dL   BUN 78 (*) 6 - 23 mg/dL   Creatinine, Ser 13.02 (*) 0.50 - 1.10 mg/dL   Calcium 6.2 (*) 8.4 - 10.5 mg/dL   Comment: CRITICAL RESULT CALLED TO, READ BACK BY AND VERIFIED WITH:     LARSON A RN 01/30/14 0757 COSTELLO B   Total Protein 6.9  6.0 - 8.3 g/dL   Albumin 1.0 (*) 3.5 - 5.2 g/dL   AST 11  0 - 37 U/L   ALT 6  0 - 35 U/L   Alkaline Phosphatase 43  39 - 117 U/L   Total Bilirubin <0.2 (*) 0.3 - 1.2 mg/dL   GFR calc non Af Amer 3 (*) >90 mL/min   GFR calc Af Amer 4 (*) >90 mL/min   Comment: (NOTE)     The eGFR has been calculated using the CKD EPI equation.     This calculation has not been validated in all clinical situations.     eGFR's persistently <90 mL/min signify possible Chronic Kidney     Disease.  CBC WITH DIFFERENTIAL     Status: Abnormal   Collection Time    01/30/14  6:16 AM      Result Value Range   WBC 5.9  4.0 - 10.5 K/uL   RBC 2.75 (*) 3.87 - 5.11 MIL/uL   Hemoglobin 7.0 (*) 12.0 - 15.0 g/dL   HCT 20.3 (*) 36.0 - 46.0 %   MCV 73.8 (*) 78.0 - 100.0 fL   MCH 25.5 (*) 26.0 - 34.0 pg   MCHC 34.5  30.0 - 36.0 g/dL   RDW 15.3  11.5 - 15.5 %   Platelets 237  150 - 400 K/uL   Neutrophils Relative % 88 (*) 43 - 77 %   Neutro Abs 5.2  1.7 - 7.7 K/uL   Lymphocytes Relative 9 (*) 12 - 46 %   Lymphs Abs 0.6 (*) 0.7 - 4.0 K/uL   Monocytes Relative 1 (*) 3 - 12 %   Monocytes Absolute 0.1  0.1 - 1.0 K/uL   Eosinophils Relative 1  0 - 5 %   Eosinophils Absolute 0.1  0.0 - 0.7 K/uL   Basophils Relative 0  0 - 1 %   Basophils Absolute 0.0  0.0 - 0.1 K/uL  LIPID PANEL     Status: Abnormal   Collection Time    01/30/14  6:16 AM      Result Value Range   Cholesterol 190  0 - 200 mg/dL   Triglycerides 244 (*) <150 mg/dL   HDL 25 (*) >39 mg/dL   Total CHOL/HDL Ratio 7.6     VLDL 49 (*) 0 - 40 mg/dL   LDL Cholesterol 116 (*)  0 - 99 mg/dL   Comment:            Total Cholesterol/HDL:CHD Risk     Coronary Heart Disease Risk Table  Men   Women      1/2 Average Risk   3.4   3.3      Average Risk       5.0   4.4      2 X Average Risk   9.6   7.1      3 X Average Risk  23.4   11.0                Use the calculated Patient Ratio     above and the CHD Risk Table     to determine the patient's CHD Risk.                ATP III CLASSIFICATION (LDL):      <100     mg/dL   Optimal      100-129  mg/dL   Near or Above                        Optimal      130-159  mg/dL   Borderline      160-189  mg/dL   High      >190     mg/dL   Very High  TROPONIN I     Status: None   Collection Time    01/30/14  6:16 AM      Result Value Range   Troponin I <0.30  <0.30 ng/mL   Comment:            Due to the release kinetics of cTnI,     a negative result within the first hours     of the onset of symptoms does not rule out     myocardial infarction with certainty.     If myocardial infarction is still suspected,     repeat the test at appropriate intervals.  URINALYSIS, ROUTINE W REFLEX MICROSCOPIC     Status: Abnormal   Collection Time    01/30/14 11:41 AM      Result Value Range   Color, Urine YELLOW  YELLOW   APPearance CLEAR  CLEAR   Specific Gravity, Urine 1.016  1.005 - 1.030   pH 6.0  5.0 - 8.0   Glucose, UA NEGATIVE  NEGATIVE mg/dL   Hgb urine dipstick MODERATE (*) NEGATIVE   Bilirubin Urine NEGATIVE  NEGATIVE   Ketones, ur NEGATIVE  NEGATIVE mg/dL   Protein, ur >300 (*) NEGATIVE mg/dL   Urobilinogen, UA 0.2  0.0 - 1.0 mg/dL   Nitrite NEGATIVE  NEGATIVE   Leukocytes, UA NEGATIVE  NEGATIVE  PROTEIN / CREATININE RATIO, URINE     Status: Abnormal   Collection Time    01/30/14 11:41 AM      Result Value Range   Creatinine, Urine 59.57     Total Protein, Urine 715     Comment: RESULTS CONFIRMED BY MANUAL DILUTION     NO NORMAL RANGE ESTABLISHED FOR THIS TEST   PROTEIN CREATININE RATIO  12.00 (*) 0.00 - 0.15  SODIUM, URINE, RANDOM     Status: None   Collection Time    01/30/14 11:41 AM      Result Value Range   Sodium, Ur 76    URINE MICROSCOPIC-ADD ON     Status: None   Collection Time    01/30/14 11:41 AM      Result Value Range   Squamous Epithelial / LPF RARE  RARE   WBC, UA 0-2  <3 WBC/hpf  RBC / HPF 0-2  <3 RBC/hpf   Bacteria, UA RARE  RARE  TYPE AND SCREEN     Status: None   Collection Time    01/30/14  4:15 PM      Result Value Range   ABO/RH(D) A POS     Antibody Screen NEG     Sample Expiration 02/02/2014     Unit Number [id number]     Blood Component Type RED CELLS,LR     Unit division 00     Status of Unit ALLOCATED     Transfusion Status OK TO TRANSFUSE     Crossmatch Result Compatible     Unit Number [id number]     Blood Component Type RED CELLS,LR     Unit division 00     Status of Unit ALLOCATED     Transfusion Status OK TO TRANSFUSE     Crossmatch Result Compatible    ABO/RH     Status: None   Collection Time    01/30/14  4:15 PM      Result Value Range   ABO/RH(D) A POS    HIV ANTIBODY (ROUTINE TESTING)     Status: None   Collection Time    01/31/14  5:36 AM      Result Value Range   HIV NON REACTIVE  NON REACTIVE   Comment: Performed at [facility], ACUTE     Status: None   Collection Time    01/31/14  5:36 AM      Result Value Range   Hepatitis B Surface Ag NEGATIVE  NEGATIVE   HCV Ab NEGATIVE  NEGATIVE   Hep A IgM NON REACTIVE  NON REACTIVE   Hep B C IgM NON REACTIVE  NON REACTIVE   Comment: (NOTE)     High levels of Hepatitis B Core IgM antibody are detectable     during the acute stage of Hepatitis B. This antibody is used     to differentiate current from past HBV infection.     Performed at [facility]  CBC     Status: Abnormal   Collection Time    01/31/14  5:36 AM      Result Value Range   WBC 3.4 (*) 4.0 - 10.5 K/uL   RBC 2.68 (*) 3.87 - 5.11 MIL/uL   Hemoglobin 6.8  (*) 12.0 - 15.0 g/dL   Comment: REPEATED TO VERIFY     CRITICAL RESULT CALLED TO, READ BACK BY AND VERIFIED WITH:     Harvie Heck (RN) [phone] 01/31/2014 L. LOMAX   HCT 19.6 (*) 36.0 - 46.0 %   MCV 73.1 (*) 78.0 - 100.0 fL   MCH 25.4 (*) 26.0 - 34.0 pg   MCHC 34.7  30.0 - 36.0 g/dL   RDW 15.0  11.5 - 15.5 %   Platelets 274  150 - 400 K/uL  RETICULOCYTES     Status: Abnormal   Collection Time    01/31/14  5:36 AM      Result Value Range   Retic Ct Pct 1.0  0.4 - 3.1 %   RBC. 2.68 (*) 3.87 - 5.11 MIL/uL   Retic Count, Manual 26.8  19.0 - 186.0 K/uL  RENAL FUNCTION PANEL     Status: Abnormal   Collection Time    01/31/14  5:36 AM      Result Value Range   Sodium 135 (*) 137 - 147 mEq/L   Potassium 5.3  3.7 -  5.3 mEq/L   Chloride 105  96 - 112 mEq/L   CO2 10 (*) 19 - 32 mEq/L   Comment: CRITICAL RESULT CALLED TO, READ BACK BY AND VERIFIED WITH:     R.SPARKS,RN 01/31/14 0648 BY BSLADE   Glucose, Bld 125 (*) 70 - 99 mg/dL   BUN 85 (*) 6 - 23 mg/dL   Creatinine, Ser 13.22 (*) 0.50 - 1.10 mg/dL   Calcium 6.7 (*) 8.4 - 10.5 mg/dL   Phosphorus 10.2 (*) 2.3 - 4.6 mg/dL   Albumin 1.2 (*) 3.5 - 5.2 g/dL   GFR calc non Af Amer 3 (*) >90 mL/min   GFR calc Af Amer 4 (*) >90 mL/min   Comment: (NOTE)     The eGFR has been calculated using the CKD EPI equation.     This calculation has not been validated in all clinical situations.     eGFR's persistently <90 mL/min signify possible Chronic Kidney     Disease.  OCCULT BLOOD X 1 CARD TO LAB, STOOL     Status: None   Collection Time    01/31/14  5:59 AM      Result Value Range   Fecal Occult Bld NEGATIVE  NEGATIVE  PREPARE RBC (CROSSMATCH)     Status: None   Collection Time    01/31/14  7:25 AM      Result Value Range   Order Confirmation ORDER PROCESSED BY BLOOD BANK     Dg Chest 2 View  01/29/2014   CLINICAL DATA:  Chest pain and shortness of breath.  EXAM: CHEST  2 VIEW  COMPARISON:  01/17/2013.  FINDINGS: Interval moderate to large-sized  left pleural effusion and small right pleural effusion. No gross change borderline enlargement of the cardiac silhouette. Left basilar atelectasis adjacent to the pleural fluid. Unremarkable bones.  IMPRESSION: 1. Interval moderate to large-sized left pleural effusion with adjacent left basilar atelectasis. 2. Interval small right pleural effusion.   Electronically Signed   By: Enrique Sack M.D.   On: 01/29/2014 19:33   US Renal  01/30/2014   CLINICAL DATA:  Acute renal insufficiency.  EXAM: RENAL/URINARY TRACT ULTRASOUND COMPLETE  COMPARISON:  01/17/2013  FINDINGS: Right Kidney:  Length: 11.6 cm. Marked increased echogenicity of the renal parenchyma since the prior study. No hydronephrosis. There is a small amount of fluid in the perinephric space.  Left Kidney  Length: 10.1 cm. Diffuse increased echogenicity of the kidney, markedly increased since the prior exam. There is small amount of perinephric fluid around the kidney.  Bladder:  Ascites around the normal appearing bladder.  IMPRESSION: Marked increased echogenicity of the renal parenchyma since the prior study of 01/17/2013, consistent with renal medical disease.  Ascites.  Small amount of perinephric fluid bilaterally, nonspecific.   Electronically Signed   By: Rozetta Nunnery M.D.   On: 01/30/2014 20:50    Assessment/Plan Acute on chronic kidney disease, Cr 13, baseline 2 s/p HD catheter placement today for HD Anemia, hgb 6.8 for blood transfusion today.  Proteinuria SLE Nephritis, history of renal biopsy 2006 Shortness of breath, left sided pleural effusion Request for image guided renal biopsy, [name] 01/30/14 Patient will be NPO after midnight, check labs in am Risks and Benefits discussed with the patient. All of the patient's questions were answered, patient is agreeable to proceed. Consent signed and in chart.  Tsosie Billing D PA-C 01/31/2014, 1:18 PM

## 2014-01-31 NOTE — H&P (Signed)
Agree.  For random renal core biopsy tomorrow.  Will check on BP in AM given significant HTN on admission which may increase risk of bleeding complication with renal biopsy.

## 2014-01-31 NOTE — Progress Notes (Signed)
Medical Student Daily Progress Note  Subjective: No events overnight. Patient was NPO overnight and has had a tunneled catheter placed for dialysis. Shortness of breath improved, but still feels pressure in her left chest when she stretches. Patient denies feeling faint or palpitations.   Objective: Vital signs in last 24 hours: Filed Vitals:   01/30/14 1631 01/30/14 1700 01/30/14 2100 01/31/14 0547  BP: 167/117  169/114 148/99  Pulse: 92  86 91  Temp: 99 F (37.2 C)  98.8 F (37.1 C) 97.2 F (36.2 C)  TempSrc:   Oral Oral  Resp: _0 Height:      Weight:  59.92 kg (132 lb 1.6 oz)    SpO2: 97%  100% 96%   Weight change: 2.948 kg (6 lb 8 oz)  Intake/Output Summary (Last 24 hours) at 01/31/14 0721 Last data filed at 01/30/14 1800  Gross per 24 hour  Intake    680 ml  Output    500 ml  Net    180 ml   Physical Exam: BP 160/113  Pulse 85  Temp(Src) 97.9 F (36.6 C) (Oral)  Resp 16  Ht _1  (1.626 m)  Wt 59.92 kg (132 lb 1.6 oz)  BMI 22.66 kg/m2  SpO2 100%  LMP 11/27/2013 General appearance: alert and no distress Throat: Right tongue with hypopigmented macule Back: symmetric, no curvature. ROM normal. No CVA tenderness. 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 in calf portion on R and L Neurologic: Mental status: Alert, oriented, thought content appropriate Motor: grossly normal Lab Results: CBC    Component Value Date/Time   WBC 3.4* 01/31/2014 0536   RBC 2.68* 01/31/2014 0536   RBC 2.68* 01/31/2014 0536   HGB 6.8* 01/31/2014 0536   HCT 19.6* 01/31/2014 0536   PLT 274 01/31/2014 0536   MCV  73.1* 01/31/2014 0536   MCH 25.4* 01/31/2014 0536   MCHC 34.7 01/31/2014 0536   RDW 15.0 01/31/2014 0536   LYMPHSABS 0.6* 01/30/2014 0616   MONOABS 0.1 01/30/2014 0616   EOSABS 0.1 01/30/2014 0616   BASOSABS 0.0 01/30/2014 0616   Lab Results  Component Value Date   RETICCTPCT 1.0 01/31/2014   Retic index: 0.3, calculated by [employer] with normal Hematocrit for a female at 30yo as 38%.  Lab Results  Component Value Date   CREATININE 13.22* 01/31/2014   BUN 85* 01/31/2014   NA 135* 01/31/2014   K 5.3 01/31/2014   CL 105 01/31/2014   CO2 10* 01/31/2014   Lab Results  Component Value Date   CALCIUM 6.7* 01/31/2014   PHOS 10.2* 01/31/2014   Lab Results  Component Value Date   LABPROT 14.4 01/30/2014   FOBT: negative    Micro Results: No results found for this or any previous visit (from the past 240 hour(s)). Studies/Results: Dg Chest 2 View  01/29/2014   CLINICAL DATA:  Chest pain and shortness of breath.  EXAM: CHEST  2 VIEW  COMPARISON:  01/17/2013.  FINDINGS: Interval moderate to large-sized left pleural effusion and small right pleural effusion. No gross change borderline enlargement of the cardiac silhouette. Left basilar atelectasis adjacent  to the pleural fluid. Unremarkable bones.  IMPRESSION: 1. Interval moderate to large-sized left pleural effusion with adjacent left basilar atelectasis. 2. Interval small right pleural effusion.   Electronically Signed   By: Enrique Sack M.D.   On: 01/29/2014 19:33   US Renal  01/30/2014   CLINICAL DATA:  Acute renal insufficiency.  EXAM: RENAL/URINARY TRACT ULTRASOUND COMPLETE  COMPARISON:  01/17/2013  FINDINGS: Right Kidney:  Length: 11.6 cm. Marked increased echogenicity of the renal parenchyma since the prior study. No hydronephrosis. There is a small amount of fluid in the perinephric space.  Left Kidney  Length: 10.1 cm. Diffuse increased echogenicity of the kidney, markedly increased since the prior exam. There is small amount of perinephric fluid around the kidney.   Bladder:  Ascites around the normal appearing bladder.  IMPRESSION: Marked increased echogenicity of the renal parenchyma since the prior study of 01/17/2013, consistent with renal medical disease.  Ascites.  Small amount of perinephric fluid bilaterally, nonspecific.   Electronically Signed   By: Rozetta Nunnery M.D.   On: 01/30/2014 20:50   Medications: I have reviewed the patient's current medications. Scheduled Meds: . calcium carbonate  5 tablet Oral TID AC  . methylPREDNISolone (SOLU-MEDROL) injection  1,000 mg Intravenous Q24H  . sodium bicarbonate  1,300 mg Oral BID   Continuous Infusions:  PRN Meds:. Assessment/Plan: Principal Problem:   Lupus nephritis Active Problems:   Acute systemic lupus erythematosus   Acute renal failure   Microcytic anemia   Hypocalcemia   Acute uremia   LOS: 2 days   Ms. Marton is a 33yo woman with history of lupus nephritis untreated for 5 years who presents with acute renal failure, L pleural effusion, anemia, mixed metabolic acidosis, hyperphosphatemia, and hypocalcemia all likely from lupus nephritis flare up and progression of lupus nephritis. Patient is awaiting kidney biopsy and diagnostic/therapeutic thoracocentesis.   1. Acute Renal Failure with FEna of 12.2% in the setting of untreated lupus nephritis for 5 years: Intrarenal cause of acute renal failure most likely due to lupus nephritis flare. Has tunneled port placed 01/31/14 by vascular surgery in preparation for dialysis. Cr of 13.22 and BUN of 85 today, compared to Cr of 13.27 and BUN of 77 and UPC of 12 on admission. HIV and Hepatitis cause of kidney diseases are also possible, but HIV antibody and Hepatitis panel are still pending. Please see individual problems for management.  -Renal diet  -Strict I/Os  -Daily renal labs, and CBC  -Dialysis today: will have 2 units of pRBC transfused with dialysis in preparation for kidney biopsy and thoracocentesis -NPO overnight for kidney biopsy and  thoracocentesis in AM of 02/01/2014 -HIV antibody pending -Hepatitis panel pending   2. SLE and lupus nephritis flare: patient has a history of lupus nephritis and has not been treated for 5 years. 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  -C3-C4 levels pending  -Methylprednisolone sodium succinate 1,067m in NS 537mIVPB  -Hold mycophenolate pending status of renal ultrasound/possible biopsy (urine pregnancy is negative should this medication is to be used)   3. Pleural Effusion: CXR shows significant left pleural effusion, and she reports feeling pressure in the left chest. Could be caused by lupus nephritis flare up, or due to acute renal failure. LFTs are normal (AST 11, ALT 6). Patient has reported feeling less short of breath after being treated with steroids, which points more  to lupus nephritis as the cause.  -Diagnostic and therapeutic thoracentesis for 02/01/2014  4. Microcytic Anemia: likely from lupus nephritis, but Hgb has been dropping (Hbg 6.8 on 01/31/14, Hgb 7.0 on 01/30/14 from 8.3 on 01/29/14, and from 10.1 on 09/01/13). MCV of 76. Iron panel shows anemia of chronic disease (iron 19, TIBC 127, ferritin 133) with some iron deficiency anemia. Reticulocyte index is 0.3, indicating hypo functioning marrow, likely from low EPO from chronic kidney disease. -Transfuse 2 units of pRBC during dialysis for Hgb<7, and procedures in AM of 02/01/2014 -Darbepoetin injection 74mg weekly for Hgb <9 in pts w/ CKD. -Monitor patient for symptoms of anemia   5. Acidosis: Had anion gap and non-gap metabolic acidosis from loss of bicarbs from kidneys and uremic state. Current bicarb is 11, up from 9 after first day of treatment.  -13080msodium bicarbonate tablet BID to correct metabolic acidosis with anion gap   6. Hyperphosphatemia and hypocalcemia: Phos 10.2, Ca 6.7. from chronic kidney disease   -Calcium carbonate 100057mAC  -PTH  Pain from tunneled catheter insertion:  -Tylenol 1300m90mH PRN  Prophylaxis:  -DVT: intermittent pneumatic compression   This is a [employer] information officere.  The care of the patient was discussed with Dr. Van Tommy Medal the assessment and plan formulated with their assistance.  Please see their attached note for official documentation of the daily encounter.  JianKonrad Saha/2015, 7:21 AM  I have seen the patient and reviewed the daily progress note by Rui Elvin SoIV and discussed the care of the patient with them.  See my note for documentation of my findings, assessment, and plans/additions.

## 2014-02-01 ENCOUNTER — Inpatient Hospital Stay (HOSPITAL_COMMUNITY): Payer: Medicaid Other

## 2014-02-01 DIAGNOSIS — N049 Nephrotic syndrome with unspecified morphologic changes: Secondary | ICD-10-CM | POA: Diagnosis present

## 2014-02-01 DIAGNOSIS — I10 Essential (primary) hypertension: Secondary | ICD-10-CM

## 2014-02-01 DIAGNOSIS — E8809 Other disorders of plasma-protein metabolism, not elsewhere classified: Secondary | ICD-10-CM | POA: Diagnosis present

## 2014-02-01 LAB — TYPE AND SCREEN
ABO/RH(D): A POS
ANTIBODY SCREEN: NEGATIVE
UNIT DIVISION: 0
Unit division: 0

## 2014-02-01 LAB — RENAL FUNCTION PANEL
ALBUMIN: 1.2 g/dL — AB (ref 3.5–5.2)
BUN: 70 mg/dL — AB (ref 6–23)
CALCIUM: 6.5 mg/dL — AB (ref 8.4–10.5)
CO2: 16 mEq/L — ABNORMAL LOW (ref 19–32)
CREATININE: 9.35 mg/dL — AB (ref 0.50–1.10)
Chloride: 104 mEq/L (ref 96–112)
GFR calc Af Amer: 6 mL/min — ABNORMAL LOW (ref >90)
GFR calc non Af Amer: 5 mL/min — ABNORMAL LOW (ref >90)
Glucose, Bld: 120 mg/dL — ABNORMAL HIGH (ref 70–99)
Phosphorus: 6.5 mg/dL — ABNORMAL HIGH (ref 2.3–4.6)
Potassium: 4.5 mEq/L (ref 3.7–5.3)
Sodium: 138 mEq/L (ref 137–147)

## 2014-02-01 LAB — CBC
HCT: 29.5 % — ABNORMAL LOW (ref 36.0–46.0)
Hemoglobin: 10.4 g/dL — ABNORMAL LOW (ref 12.0–15.0)
MCH: 27.1 pg (ref 26.0–34.0)
MCHC: 35.3 g/dL (ref 30.0–36.0)
MCV: 76.8 fL — AB (ref 78.0–100.0)
Platelets: 232 10*3/uL (ref 150–400)
RBC: 3.84 MIL/uL — ABNORMAL LOW (ref 3.87–5.11)
RDW: 15 % (ref 11.5–15.5)
WBC: 8.4 10*3/uL (ref 4.0–10.5)

## 2014-02-01 LAB — HEPATITIS B SURFACE ANTIBODY,QUALITATIVE: Hep B S Ab: NEGATIVE

## 2014-02-01 LAB — PTH, INTACT AND CALCIUM
CALCIUM TOTAL (PTH): 6.7 mg/dL — AB (ref 8.4–10.5)
PTH: 437.5 pg/mL — ABNORMAL HIGH (ref 14.0–72.0)

## 2014-02-01 LAB — HEPATITIS B CORE ANTIBODY, TOTAL: Hep B Core Total Ab: NONREACTIVE

## 2014-02-01 MED ORDER — MIDAZOLAM HCL 2 MG/2ML IJ SOLN
INTRAMUSCULAR | Status: AC
  Filled 2014-02-01: qty 4

## 2014-02-01 MED ORDER — LABETALOL HCL 5 MG/ML IV SOLN
20.0000 mg | INTRAVENOUS | Status: AC | PRN
  Administered 2014-02-01: 20 mg via INTRAVENOUS
  Filled 2014-02-01 (×2): qty 4

## 2014-02-01 MED ORDER — MIDAZOLAM HCL 2 MG/2ML IJ SOLN
INTRAMUSCULAR | Status: AC | PRN
  Administered 2014-02-01 (×2): 1 mg via INTRAVENOUS

## 2014-02-01 MED ORDER — PREDNISONE 50 MG PO TABS
60.0000 mg | ORAL_TABLET | Freq: Every day | ORAL | Status: DC
  Administered 2014-02-02 – 2014-02-07 (×5): 60 mg via ORAL
  Filled 2014-02-01 (×7): qty 1

## 2014-02-01 MED ORDER — CLONIDINE HCL 0.1 MG PO TABS
0.2000 mg | ORAL_TABLET | Freq: Four times a day (QID) | ORAL | Status: DC | PRN
  Administered 2014-02-01 – 2014-02-07 (×3): 0.2 mg via ORAL
  Filled 2014-02-01 (×3): qty 2

## 2014-02-01 MED ORDER — FENTANYL CITRATE 0.05 MG/ML IJ SOLN
INTRAMUSCULAR | Status: AC | PRN
  Administered 2014-02-01 (×2): 50 ug via INTRAVENOUS

## 2014-02-01 MED ORDER — HYDRALAZINE HCL 20 MG/ML IJ SOLN
20.0000 mg | INTRAMUSCULAR | Status: DC | PRN
  Administered 2014-02-01: 20 mg via INTRAVENOUS
  Filled 2014-02-01: qty 1

## 2014-02-01 MED ORDER — AMLODIPINE BESYLATE 10 MG PO TABS
10.0000 mg | ORAL_TABLET | Freq: Every day | ORAL | Status: DC
  Administered 2014-02-01: 10 mg via ORAL
  Filled 2014-02-01 (×2): qty 1

## 2014-02-01 MED ORDER — FENTANYL CITRATE 0.05 MG/ML IJ SOLN
INTRAMUSCULAR | Status: AC
  Filled 2014-02-01: qty 4

## 2014-02-01 NOTE — Progress Notes (Signed)
Pt returned from getting renal biopsy completed. Pt to lay prone for 2 hours post procedure. Will notify doctor and replace telemetry monitor when able to lay supine.  [name], [name]

## 2014-02-01 NOTE — Progress Notes (Signed)
Medical Student Daily Progress Note  Subjective: Patient had blood pressure ranging in 190. Patient was given $RemoveB'5mg'yuRAjzmr$  hydralazine for blood pressure to respond. Patient denies having worsening shortness of breath, headache, blurry vision, or chest pain. Patient was able to urinate once at night and twice this morning.   Objective: Vital signs in last 24 hours: Filed Vitals:   01/31/14 2016 01/31/14 2340 02/01/14 0525 02/01/14 0700  BP: 192/103 166/104 136/91 168/114  Pulse: 70 91 89 77  Temp: 98.1 F (36.7 C)  97.9 F (36.6 C) 98 F (36.7 C)  TempSrc: Oral  Oral Oral  Resp: $Remo'18  18 18  'lhAYy$ Height: $Rem'5\' 4"'QGBb$  (1.626 m)     Weight: 56.609 kg (124 lb 12.8 oz)     SpO2: 99%  95% 100%   Weight change: -0.62 kg (-1 lb 5.9 oz)  Intake/Output Summary (Last 24 hours) at 02/01/14 0907 Last data filed at 01/31/14 1700  Gross per 24 hour  Intake   1130 ml  Output   1600 ml  Net   -470 ml   Physical Exam: BP 168/116  Pulse 64  Temp(Src) 97.8 F (36.6 C) (Oral)  Resp 17  Ht $R'5\' 4"'pX$  (1.626 m)  Wt 56.609 kg (124 lb 12.8 oz)  BMI 21.41 kg/m2  SpO2 96%  LMP 12/27/2013 General appearance: alert, cooperative and no distress Throat: hypopigmented macule on right tongue Back: symmetric, no curvature. ROM normal. No CVA tenderness. Lungs: diminished breath sounds base - left and LLL, dullness to percussion base - left and LLL and dullness to percussion on left has moved down 2 centimeters since admission Heart: regular rate and rhythm, S1, S2 normal, no murmur, click, rub or gallop Abdomen: soft, non-tender; bowel sounds normal; no masses,  no organomegaly Extremities: extremities normal, atraumatic, no cyanosis or edema Skin: livido reticularis on left and right calf Lab Results: CBC    Component Value Date/Time   WBC 8.4 02/01/2014 0448   RBC 3.84* 02/01/2014 0448   RBC 2.68* 01/31/2014 0536   HGB 10.4* 02/01/2014 0448   HCT 29.5* 02/01/2014 0448   PLT 232 02/01/2014 0448   MCV 76.8* 02/01/2014 0448   MCH  27.1 02/01/2014 0448   MCHC 35.3 02/01/2014 0448   RDW 15.0 02/01/2014 0448   LYMPHSABS 0.6* 01/30/2014 0616   MONOABS 0.1 01/30/2014 0616   EOSABS 0.1 01/30/2014 0616   BASOSABS 0.0 01/30/2014 0616    Lab Results  Component Value Date   CREATININE 9.35* 02/01/2014   BUN 70* 02/01/2014   NA 138 02/01/2014   K 4.5 02/01/2014   CL 104 02/01/2014   CO2 16* 02/01/2014   Lab Results  Component Value Date   PTH 150.0* 09/01/2013   CALCIUM 6.5* 02/01/2014   CAION 1.23 07/05/2012   PHOS 6.5* 02/01/2014   Complement 01/30/2014: C3: 59 C4: 32  Micro Results: No results found for this or any previous visit (from the past 240 hour(s)). Studies/Results: US Renal  01/30/2014   CLINICAL DATA:  Acute renal insufficiency.  EXAM: RENAL/URINARY TRACT ULTRASOUND COMPLETE  COMPARISON:  01/17/2013  FINDINGS: Right Kidney:  Length: 11.6 cm. Marked increased echogenicity of the renal parenchyma since the prior study. No hydronephrosis. There is a small amount of fluid in the perinephric space.  Left Kidney  Length: 10.1 cm. Diffuse increased echogenicity of the kidney, markedly increased since the prior exam. There is small amount of perinephric fluid around the kidney.  Bladder:  Ascites around the normal appearing bladder.  IMPRESSION:  Marked increased echogenicity of the renal parenchyma since the prior study of 01/17/2013, consistent with renal medical disease.  Ascites.  Small amount of perinephric fluid bilaterally, nonspecific.   Electronically Signed   By: Rozetta Nunnery M.D.   On: 01/30/2014 20:50   Dg Chest Port 1v Same Day  01/31/2014   CLINICAL DATA:  Dialysis catheter placement.  EXAM: PORTABLE CHEST - 1 VIEW SAME DAY  COMPARISON:  DG CHEST 2 VIEW dated 01/29/2014  FINDINGS: Interim placement of dialysis catheter. Tip is at the cavoatrial junction. Cardiomegaly, normal pulmonary vascularity. Persistent large left-sided pleural effusion. Left base atelectasis and/or pneumonia cannot be excluded. Small right pleural effusion not  identified on today's exam . No pneumothorax. No acute osseous abnormality.  IMPRESSION: 1. And replacement dialysis catheter with tips at the cavoatrial junction. 2. Cardiomegaly, no pulmonary venous congestion. 3. Persistent large left-sided pleural effusion. Left base atelectasis and/or pneumonia cannot be excluded. These findings are stable from prior exam. Small right pleural effusion not identified on today's exam.   Electronically Signed   By: [place]   On: 01/31/2014 13:35   Medications: I have reviewed the patient's current medications. Scheduled Meds: . amLODipine  5 mg Oral Daily  . calcium carbonate  5 tablet Oral TID AC  . darbepoetin (ARANESP) injection - DIALYSIS  60 mcg Intravenous Q Wed-HD  . methylPREDNISolone (SOLU-MEDROL) injection  1,000 mg Intravenous Q24H  . sodium bicarbonate  1,300 mg Oral BID   Continuous Infusions:  PRN Meds:.acetaminophen, cloNIDine, hydrALAZINE Assessment/Plan: Principal Problem:   ESRD on dialysis Active Problems:   Acute systemic lupus erythematosus   Lupus nephritis   Acute renal failure   Microcytic anemia   Hypocalcemia   Acute uremia   Pleural effusion, left   Metabolic acidosis, increased anion gap (IAG)   LOS: 3 days   [name] is a 33yo woman with history of lupus nephritis untreated for 5 years who presents with acute renal failure and pleural effusion all likely from lupus nephritis flare up. Patient is waiting for renal biopsy and thoracocentesis.   1. Acute Renal Failure with FEna of 12.2% in the setting of untreated lupus nephritis for 5 years: Intrarenal cause of acute renal failure most likely due to lupus nephritis flare. Cr of 9.35, BUN 70, down from Cr of 13.27 and BUN of 77 on admission. Renal ultrasound showed normal size kidneys with increased echogenicity. Patient has tunneled catheter placed for temporary RRT (1st dialysis on 01/31/2014), but will likely need permanent RRT. HIV and Hepatitis cause of kidney  diseases are ruled out due to nonreactive HIV antibody and hepatitis panel. Please see individual problems for management.  -Waiting for renal biopsy when blood pressure becomes controlled or below 165/90  -Renal diet  -Strict I/Os  -Daily renal labs, and CBC   2. Hypertensive Urgency: Patient was dialyzed on 01/31/2014, and had blood pressure >190 overnight without symptoms and treated with hydralazine. -Clonidine 0.2mg  q6H PRN, given first while BP>165/90. -Hydralazine 5mg  q4H PRN if BP>170 -Lobetalol 20mg  q4H for BP>160/90 -Amlodipine 10mg  qD  3. SLE and lupus nephritis flare: patient has a history of lupus nephritis and has not been treated for 5 years. She has symptoms of flare with elevated ESR of 139, low C3 (59), though normal C4 level of 32, renal symptoms as described above, skin changes with livedo reticularis on leg starting in December.  -Methylprednisolone sodium succinate 1,000mg  in NS 1mL IVPB  -Hold mycophenolate pending status of renal ultrasound/possible biopsy (  urine pregnancy is negative should this medication is to be used)   4. Pleural Effusion: CXR shows significant left pleural effusion, and she reports feeling pressure in the left chest. Most likely caused by nephrotic syndrome. LFTs are normal (AST 11, ALT 6). Patient has reported feeling less short of breath after being treated with steroids, which points more to lupus nephritis as the cause.  -Diagnostic and therapeutic thoracentesis, consulted pulm critical care -Will need pleural fluid protein, LDH, glucose, cell count, and albumin  5. Microcytic Anemia: Hgb of 10.4 after 2 pRBC transfusion with pre-transfusion hgb of 6.8.  -Darbepoetin injection 38mcg weekly  6. Acidosis: CO2 of 16 on 02/01/2014 -1300mg  sodium bicarbonate tablet BID to correct metabolic acidosis with anion gap   7. Hyperphosphatemia and hypocalcemia: phos of 6.5 and calcium of 6.5 on 02/01/2014. PTH of 150 -Calcium carbonate 1000mg  qAC    Prophylaxis:  -DVT: intermittent pneumatic compression   This is a Careers information officer Note.  The care of the patient was discussed with Dr. Tommy Medal and the assessment and plan formulated with their assistance.  Please see their attached note for official documentation of the daily encounter.  Konrad Saha 02/01/2014, 9:07 AM  I have seen the patient and reviewed the daily progress note by Elvin So MS IV and discussed the care of the patient with them.  Please see note for my findings, assessment, and plans/additions.   Signed:  Jessee Avers, MD PGY-2 Internal Medicine Teaching Service Pager: [phone]

## 2014-02-01 NOTE — Progress Notes (Addendum)
Patient scheduled for renal biopsy today in IR. BP remains elevated, nephrology has seen the patient today and adjusted medications. IR will follow BP if consistently systolic < 99991111 and diastolic < 99991111 will proceed with biopsy per Dr. Graylon Gunning PA-C Interventional Radiology  02/01/14  11:39 AM\

## 2014-02-01 NOTE — Procedures (Signed)
Procedure:  Ultrasound guided renal biopsy Findings:  Two 66 G core biopsy samples obtained of left LP renal cortex.  No complications.

## 2014-02-01 NOTE — Progress Notes (Signed)
Admit: 01/29/2014 LOS: 3  36F w/ hx/o IIIa/V SLE Nephritis (Bx 2006), CKD (BL SCr ~2 --> 13) not adherent to treatment x4-5y admitted with AKI, SCr 13, UP/C 12.  Pt also with L sided pleural effusion, dyspnea,   Subjective:  NAEON BP up S/p TDC yesterdy HD #1 yesterday (01/31/14) For Renal Biopsy today, 1.6L UF Rec 2u PRBC yesterday Breathing stable.  02/04 0701 - 02/05 0700 In: 1130 [P.O.:480; Blood:650] Out: 1600   Filed Weights   01/31/14 1355 01/31/14 1608 01/31/14 2016  Weight: 59.3 kg (130 lb 11.7 oz) 58.5 kg (128 lb 15.5 oz) 56.609 kg (124 lb 12.8 oz)    Current meds: reviewed, s/p 3d pulse solumedrol 1g (2/2-2/4) Current Labs: reviewed    Physical Exam:  Blood pressure 168/114, pulse 77, temperature 98 F (36.7 C), temperature source Oral, resp. rate 18, height 5\' 4"  (1.626 m), weight 56.609 kg (124 lb 12.8 oz), last menstrual period 12/27/2013, SpO2 100.00%. NAD, awake, alert, appropriate RRR.  No rub. Nl s1s2 BS diminshed in L lower field.  CTA otherwise Trace to 1+ pitting edema Rash on legs unchanged Nonfocal, MAE NCAT  Assessment/Plan 1. AoCKD in setting of hx/o SLE III/V nephritis: HD #2 tomorrow.  Target 2L UF 2. SLE Nephritis: Renal Bx today.  S/p pulse medrol 1g (2/2-2/4). Should now be on 60 mg pred daily.  Normal C3, C4, and bland UA suggest that this is not an aggressive, acute GN.  Might just be scarring, IF/TA. 3. HTN: add clonidine for DBP and SBP.  Amlodipine to 10mg .  Cont to inc UF goals at HD 4. Nephrotic Proteiniuria: as above. 5. Microcytic Anemia: Improved s/p 2u PRBC 6. Metabolic Acidosis: Improved with HD.  Stop PO NAHCO3 7. L pleural effusion: agree with thoracentesis.  Unsure how well iHD will address 8. Hyperphosphatemia: on Tums 1qAC.  HD today.  Check PTH.    Pearson Grippe MD 02/01/2014, 9:03 AM   Recent Labs Lab 01/30/14 0020 01/30/14 0616 01/31/14 0536 02/01/14 0448  NA  --  136* 135* 138  K  --  4.9 5.3 4.5  CL  --  107 105  104  CO2  --  9* 10* 16*  GLUCOSE  --  85 125* 120*  BUN  --  78* 85* 70*  CREATININE  --  13.02* 13.22* 9.35*  CALCIUM  --  6.2* 6.7* 6.5*  PHOS 10.5*  --  10.2* 6.5*    Recent Labs Lab 01/30/14 0616 01/31/14 0536 01/31/14 1936 02/01/14 0448  WBC 5.9 3.4* 10.9* 8.4  NEUTROABS 5.2  --   --   --   HGB 7.0* 6.8* 10.2* 10.4*  HCT 20.3* 19.6* 28.3* 29.5*  MCV 73.8* 73.1* 77.1* 76.8*  PLT 237 274 248 232

## 2014-02-01 NOTE — Progress Notes (Signed)
Subjective: Patient feels better. No other complaints today. No fevers overnight.  Objective: Vital signs in last 24 hours: Filed Vitals:   02/01/14 0700 02/01/14 1030 02/01/14 1134 02/01/14 1237  BP: 168/114 168/116 178/116 114/75  Pulse: 77 64 84 91  Temp: 98 F (36.7 C) 97.8 F (36.6 C)    TempSrc: Oral Oral    Resp: 18 17    Height:      Weight:      SpO2: 100% 96%     Weight change: -1 lb 5.9 oz (-0.62 kg)  Intake/Output Summary (Last 24 hours) at 02/01/14 1327 Last data filed at 01/31/14 1700  Gross per 24 hour  Intake    890 ml  Output   1600 ml  Net   -710 ml   General: resting in bed, no acute distress. Not in acute distress.  Cardiac: RRR, no rubs, murmurs or gallops Pulm: reduced air movement in the lower left lung zones, dull percussion on the left posterior. Air movent is normal on right side. Not in respiratory distress at rest.  Right IJ catheter site looks clean but with soiled with little blood. No active bleeding.  Abd: soft, nontender, nondistended, BS normoactive Ext: warm and well perfused, trace pretibial pitting edema  Skin: livido reticularis of b/l LE Neuro: alert and oriented X3, cranial nerves II-XII grossly intact  Lab Results: Basic Metabolic Panel:  Recent Labs Lab 01/29/14 1818  01/30/14 0020  01/31/14 0536 02/01/14 0448  NA 137  --   --   < > 135* 138  K 5.2  --   --   < > 5.3 4.5  CL 107  --   --   < > 105 104  CO2 11*  --   --   < > 10* 16*  GLUCOSE 82  --   --   < > 125* 120*  BUN 77*  --   --   < > 85* 70*  CREATININE 13.27*  --   --   < > 13.22* 9.35*  CALCIUM 6.0*  --   --   < > 6.7* 6.5*  MG  --   --  1.5  --   --   --   PHOS  --   < > 10.5*  --  10.2* 6.5*  < > = values in this interval not displayed.AG 18  Liver Function Tests:  Recent Labs Lab 01/30/14 0020 01/30/14 0616 01/31/14 0536 02/01/14 0448  AST 10 11  --   --   ALT 5 6  --   --   ALKPHOS 46 43  --   --   BILITOT <0.2* <0.2*  --   --   PROT 6.9  6.9  --   --   ALBUMIN 1.0* 1.0* 1.2* 1.2*   CBC:  Recent Labs Lab 01/30/14 0616  01/31/14 1936 02/01/14 0448  WBC 5.9  < > 10.9* 8.4  NEUTROABS 5.2  --   --   --   HGB 7.0*  < > 10.2* 10.4*  HCT 20.3*  < > 28.3* 29.5*  MCV 73.8*  < > 77.1* 76.8*  PLT 237  < > 248 232  < > = values in this interval not displayed. Coagulation:  Recent Labs Lab 01/30/14 0020  LABPROT 14.4  INR 1.14   Anemia Panel:  Recent Labs Lab 01/30/14 0020 01/31/14 0536  FERRITIN 133  --   TIBC 127*  --   IRON 19*  --  RETICCTPCT  --  1.0   Urinalysis:  Recent Labs Lab 01/30/14 1141  COLORURINE YELLOW  LABSPEC 1.016  PHURINE 6.0  GLUCOSEU NEGATIVE  HGBUR MODERATE*  BILIRUBINUR NEGATIVE  KETONESUR NEGATIVE  PROTEINUR >300*  UROBILINOGEN 0.2  NITRITE NEGATIVE  LEUKOCYTESUR NEGATIVE   Studies/Results: US Renal  01/30/2014   CLINICAL DATA:  Acute renal insufficiency.  EXAM: RENAL/URINARY TRACT ULTRASOUND COMPLETE  COMPARISON:  01/17/2013  FINDINGS: Right Kidney:  Length: 11.6 cm. Marked increased echogenicity of the renal parenchyma since the prior study. No hydronephrosis. There is a small amount of fluid in the perinephric space.  Left Kidney  Length: 10.1 cm. Diffuse increased echogenicity of the kidney, markedly increased since the prior exam. There is small amount of perinephric fluid around the kidney.  Bladder:  Ascites around the normal appearing bladder.  IMPRESSION: Marked increased echogenicity of the renal parenchyma since the prior study of 01/17/2013, consistent with renal medical disease.  Ascites.  Small amount of perinephric fluid bilaterally, nonspecific.   Electronically Signed   By: Geanie Cooley M.D.   On: 01/30/2014 20:50   Dg Chest Port 1v Same Day  01/31/2014   CLINICAL DATA:  Dialysis catheter placement.  EXAM: PORTABLE CHEST - 1 VIEW SAME DAY  COMPARISON:  DG CHEST 2 VIEW dated 01/29/2014  FINDINGS: Interim placement of dialysis catheter. Tip is at the cavoatrial  junction. Cardiomegaly, normal pulmonary vascularity. Persistent large left-sided pleural effusion. Left base atelectasis and/or pneumonia cannot be excluded. Small right pleural effusion not identified on today's exam . No pneumothorax. No acute osseous abnormality.  IMPRESSION: 1. And replacement dialysis catheter with tips at the cavoatrial junction. 2. Cardiomegaly, no pulmonary venous congestion. 3. Persistent large left-sided pleural effusion. Left base atelectasis and/or pneumonia cannot be excluded. These findings are stable from prior exam. Small right pleural effusion not identified on today's exam.   Electronically Signed   By: Maisie Fus  Register   On: 01/31/2014 13:35   Medications: I have reviewed the patient's current medications. Scheduled Meds: . amLODipine  10 mg Oral Daily  . calcium carbonate  5 tablet Oral TID AC  . darbepoetin (ARANESP) injection - DIALYSIS  60 mcg Intravenous Q Wed-HD  . methylPREDNISolone (SOLU-MEDROL) injection  1,000 mg Intravenous Q24H   Continuous Infusions:  PRN Meds:.  Assessment/Plan: [name] is a 33 yo woman with history of SLE & lupus nephritis admitted on 01/29/14 with chest pain, found to be in acute lupus flare - chest pain has since improved.   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 creatinine remains elevated. Also Elevated ESR, complicated by hypocalcemia, hyperphosphatemia and acidosis.  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 Renal u/s reveals progressive renal disease. Prot/Cr = 12. FEna 12%, LDL 116. HD catheter placed in right IJ on 01/31/2014. Had dialysis yesterday.   Plan  - appreciate nephrology in put.  - she will most likely require permanent HD - change IV solumedrol to oral prednisone 60 mg daily  - on NaHCO3 supp with 1300 bid, goal bicarb >22. Improving Bicarb 10>>16 - cont with Aranesp (60 qWed)   -Regarding hypoCa, hyperPhos, cont with tums 1g AC - renal biopsy to clarify  acuity/chronicity/staging will be planned - likely tomorrow   # severe Hypertension: likely related to her renal disease.BP elevated to to a peak of 194/119. Started amlodipine 10 mg daily, with good response. Plan. -Continue with amlodipine 10 mg daily. -Per nephrology started on with clonidine 0.2 mg every 6 when necessary,  hydralazine, 20 mg every 4 when necessary, and Labetalol, IV, 20 mg every 4 when necessary - prn antiHTN can be discontinued if her blood pressure remains controlled on amlodipine.  #Left sided Pleural effusion: Improving. symptoms improved and O2 sats are stable. Etiology is likely renal failure with transudate. Patient does not appear volume overloaded.  Plan  - contacted PCCM for thoracocentesis. - Placed orders for pleural fluid cell count, LDH, protein, albumin and glucose. Other tests as indicated from initial labs.   #Microcytic anemia : Hb 8.3 --> 7, patient is asymptomatic at this time, ferritin 133, but iron 19, no active s/s of bleeding, likely related to ACD d/t renal failure, no menstrual cycle in 2 months; no hx of melena or bloody stools. Transfuse with 2 units of PRBC on 01/31/2014  Plan  - ESA as above  - hemoglobin level is improved to 10.4 after 2 units of blood transfusion. - goal 9-11  #SCD: hold heparin for procedure, SCDs, patient ambulatory  --> remainder per MSIV note  Dispo: Disposition is deferred at this time, awaiting improvement of current medical problems.  Anticipated discharge in approximately 2-3 day(s).   The patient does not have a current PCP (No Pcp Per Patient) and may need an [place] follow-up appointment after discharge.  The patient does not have transportation limitations that hinder transportation to clinic appointments.  .Services Needed at time of discharge: Y = Yes, Blank = No PT:   OT:   RN:   Equipment:   Other:     LOS: 3 days   Signed:  Jessee Avers, MD PGY-2 Internal Medicine Teaching  Service Pager: [phone] 02/01/2014, 1:27 PM

## 2014-02-01 NOTE — Progress Notes (Signed)
Dr. Joelyn Oms in the room with patient during head to toe assessment. MD noted elevated BP and stated to give PRN clonidine. Pt still has PRN hydralazine ordered - duplicate PRN therapy noted. IMTS MD notified and awaiting call back to verify orders.  [name], [place] [place]

## 2014-02-01 NOTE — Consult Note (Signed)
Name: CARRESSA KIESOW MRN: [id number] DOB: 1981/10/20    ADMISSION DATE:  01/29/2014 CONSULTATION DATE:  2/5  REFERRING MD :  Lucianne Lei dam  PRIMARY SERVICE:  IMTS CHIEF COMPLAINT:  Pleural effusion   BRIEF PATIENT DESCRIPTION:  54 yof with known lupus and history of lupus nephritis, who fell out of care x 51yrs having previously been followed by Dr. Charlestine Night with rheumatology. Admitted w/ acute renal failure, with AGAP metabolic acidosis, hypocalcemia and hyperphosphatemia uremia. Pulmonary was asked to see re: left pleural effusion that has not improved w/ volume removal via HD.      SIGNIFICANT EVENTS / STUDIES:  2/2: admitted w/ dyspnea, acute renal failure and metabolic acidosis. Started on pulse steroids. Renal consulted.  2/4 first round of HD 2/5 renal bx  LINES / TUBES: 2/4 right IJ vasc cath >>>  CULTURES:  ANTIBIOTICS:   HISTORY OF PRESENT ILLNESS:    84 yof with known lupus and history of lupus nephritis, who fell out of care having previously been followed by Dr. Charlestine Night with rheumatology. She is admitted 2/2 with cc; 1 mo h/o progressive SOB, w/ increased LE edema. Dx eval uncovered: acute renal failure, with AGAP metabolic acidosis, hypocalcemia and hyperphosphatemia uremia. bilateral pleural effusions and new rash on her legs. Diagnostic/therapeutic interventions to date have included: initiation of high dose steroids, nephrology consult and initiation of HD on 2/4, and IR consult for renal bx in effort to establish acute vs chronic SLE nephritis. Pulmonary was asked to see re: left pleural effusion that has not improved w/ volume removal via HD.      PAST MEDICAL HISTORY :  Past Medical History  Diagnosis Date  . Lupus (systemic lupus erythematosus)     Previously followed with Dr. Charlestine Night, has not followed up recently  . Lupus nephritis 2006    Renal biopsy shows segmental endocapillary proliferation and cellular crescent formation (Class IIIA) and lupus membranous  glomerulopathy (Class V, stage II)  . S/P pericardiocentesis 01/17/2013    H/o pericardial effusion with tamponade 2006   . H/O pleural effusion 01/17/2013  . H/O pericarditis 01/17/2013  . Polysubstance abuse     cocaine, MJ, tobacco   History reviewed. No pertinent past surgical history. Prior to Admission medications   Medication Sig Start Date End Date Taking? Authorizing Provider  acetaminophen (TYLENOL) 500 MG tablet Take 1,000 mg by mouth every 6 (six) hours as needed for pain.   Yes Historical Provider, MD  diphenhydramine-acetaminophen (TYLENOL PM) 25-500 MG TABS Take 1 tablet by mouth at bedtime as needed (sleep).    Yes Historical Provider, MD   Allergies  Allergen Reactions  . Tobramycin Sulfate Swelling    FAMILY HISTORY:  History reviewed. No pertinent family history. SOCIAL HISTORY:  reports that she has been smoking.  She does not have any smokeless tobacco history on file. She reports that she drinks alcohol. She reports that she uses illicit drugs (Marijuana and Cocaine) about 5 times per week.  Review of Systems:   Bolds are positive  Constitutional: weight loss, gain, night sweats, Fevers, chills, fatigue .  HEENT: headaches, Sore throat, sneezing, nasal congestion, post nasal drip, Difficulty swallowing, Tooth/dental problems, visual complaints visual changes, ear ache CV:  chest pain, radiates: ,Orthopnea, PND, swelling in lower extremities, dizziness, palpitations, syncope.  GI  heartburn, indigestion, abdominal pain, nausea, vomiting, diarrhea, change in bowel habits, loss of appetite, bloody stools.  Resp: cough, productive: , hemoptysis, dyspnea, chest pain, pleuritic.  Skin: rash or itching  or icterus GU: dysuria, change in color of urine, urgency or frequency. flank pain, hematuria  MS: joint pain or swelling. decreased range of motion  Psych: change in mood or affect. depression or anxiety.  Neuro: difficulty with speech, weakness, numbness, ataxia    SUBJECTIVE:  No distress  VITAL SIGNS: Temp:  [97.7 F (36.5 C)-98.8 F (37.1 C)] 98 F (36.7 C) (02/05 0700) Pulse Rate:  [70-92] 77 (02/05 0700) Resp:  [16-23] 18 (02/05 0700) BP: (136-194)/(91-135) 168/114 mmHg (02/05 0700) SpO2:  [95 %-100 %] 100 % (02/05 0700) Weight:  [56.609 kg (124 lb 12.8 oz)-59.3 kg (130 lb 11.7 oz)] 56.609 kg (124 lb 12.8 oz) (02/04 2016)  PHYSICAL EXAMINATION: General:  No acute distress.  Neuro:  Awake, no acute distress.  HEENT:  [place], no JVD  Cardiovascular:  rrr Lungs:  Decreased BS, VR on left  Abdomen:  Soft, non-tender  Musculoskeletal:  Intact  Skin:  Intact    Recent Labs Lab 01/30/14 0616 01/31/14 0536 02/01/14 0448  NA 136* 135* 138  K 4.9 5.3 4.5  CL 107 105 104  CO2 9* 10* 16*  BUN 78* 85* 70*  CREATININE 13.02* 13.22* 9.35*  GLUCOSE 85 125* 120*    Recent Labs Lab 01/31/14 0536 01/31/14 1936 02/01/14 0448  HGB 6.8* 10.2* 10.4*  HCT 19.6* 28.3* 29.5*  WBC 3.4* 10.9* 8.4  PLT 274 248 232   US Renal  01/30/2014   CLINICAL DATA:  Acute renal insufficiency.  EXAM: RENAL/URINARY TRACT ULTRASOUND COMPLETE  COMPARISON:  01/17/2013  FINDINGS: Right Kidney:  Length: 11.6 cm. Marked increased echogenicity of the renal parenchyma since the prior study. No hydronephrosis. There is a small amount of fluid in the perinephric space.  Left Kidney  Length: 10.1 cm. Diffuse increased echogenicity of the kidney, markedly increased since the prior exam. There is small amount of perinephric fluid around the kidney.  Bladder:  Ascites around the normal appearing bladder.  IMPRESSION: Marked increased echogenicity of the renal parenchyma since the prior study of 01/17/2013, consistent with renal medical disease.  Ascites.  Small amount of perinephric fluid bilaterally, nonspecific.   Electronically Signed   By: Rozetta Nunnery M.D.   On: 01/30/2014 20:50   Dg Chest Port 1v Same Day  01/31/2014   CLINICAL DATA:  Dialysis catheter placement.  EXAM:  PORTABLE CHEST - 1 VIEW SAME DAY  COMPARISON:  DG CHEST 2 VIEW dated 01/29/2014  FINDINGS: Interim placement of dialysis catheter. Tip is at the cavoatrial junction. Cardiomegaly, normal pulmonary vascularity. Persistent large left-sided pleural effusion. Left base atelectasis and/or pneumonia cannot be excluded. Small right pleural effusion not identified on today's exam . No pneumothorax. No acute osseous abnormality.  IMPRESSION: 1. And replacement dialysis catheter with tips at the cavoatrial junction. 2. Cardiomegaly, no pulmonary venous congestion. 3. Persistent large left-sided pleural effusion. Left base atelectasis and/or pneumonia cannot be excluded. These findings are stable from prior exam. Small right pleural effusion not identified on today's exam.   Electronically Signed   By: [place]   On: 01/31/2014 13:35    ASSESSMENT / PLAN:  Left pleural effusion. In setting of SLE nephrititis w/ acute on chronic renal failure. Dyspnea has improved some w/ HD. Not sure at this point if this represents pleural involvement of her SLE or if it's due to her renal failure. Could be a mix of both.  Plan -will go ahead w/ therapeutic diagnostic thoracentesis - send fluid for cell count, chemistry, ANA &  cultures -exudate would confirm lupus related -f/u cxr  Acute on chronic renal renal failure:  in setting of hx/o SLE III/V nephritis. Nephrology following. Concerned that she is now ESRD.  Plan -renal bx pending -further w/u per renal   HTN,  Microcytic Anemia, per IM and renal   Rigoberto Noel  Pulmonary and [place] Pager: [phone]  02/01/2014, 10:16 AM

## 2014-02-01 NOTE — Progress Notes (Signed)
Pt returned from renal biopsy.   1508 - Left flank bandaid with scant amount of staining noted.  1523 - No change in bandaid or pt's status. 1539 - No change in bandaid or pt's status. 1557 - No change in bandaid or pt's status. 1639 - No change in bandaid or pt's status. Pt was sitting up on edge of bed - instructed that she needs to remain prone until 1700 and then either prone/supine until 1900.  1715 - No change in bandaid or pt's status.  Pt's vitals without change during 4 hour monitoring block. Bandaid to left flank remains unchanged. Pt's bedrest D/C'd at 1900.  [name], Est[facility] [place]

## 2014-02-01 NOTE — Progress Notes (Signed)
  Date: 02/01/2014  Patient name: [name]  Medical record number: [id number]  Date of birth: 16-Sep-1981   This patient has been seen and the plan of care was discussed with the house staff. Please see their note for complete details. I concur with their findings with the following additions/corrections:  Patient doing relatively well. Small amount of blood on her HD catheter bandage. Still with first depression on the left side.  Greatly appreciate critical care pulmonary medicine performing diagnostic and therapeutic thoracocentesis.  Greatly appreciate guidance from nephrology and assistance from vascular surgery. Also greatly appreciate assistance from interventional radiology who plan on doing a IR guided renal biopsy hopefully later today.    Truman Hayward, MD 02/01/2014, 11:40 AM

## 2014-02-02 ENCOUNTER — Inpatient Hospital Stay (HOSPITAL_COMMUNITY): Payer: Medicaid Other

## 2014-02-02 DIAGNOSIS — M329 Systemic lupus erythematosus, unspecified: Secondary | ICD-10-CM

## 2014-02-02 DIAGNOSIS — N19 Unspecified kidney failure: Secondary | ICD-10-CM

## 2014-02-02 DIAGNOSIS — J90 Pleural effusion, not elsewhere classified: Secondary | ICD-10-CM

## 2014-02-02 DIAGNOSIS — E0590 Thyrotoxicosis, unspecified without thyrotoxic crisis or storm: Secondary | ICD-10-CM

## 2014-02-02 DIAGNOSIS — R030 Elevated blood-pressure reading, without diagnosis of hypertension: Secondary | ICD-10-CM

## 2014-02-02 LAB — GLUCOSE, SEROUS FLUID: Glucose, Fluid: 150 mg/dL

## 2014-02-02 LAB — BODY FLUID CELL COUNT WITH DIFFERENTIAL
EOS FL: 0 %
Lymphs, Fluid: 21 %
Monocyte-Macrophage-Serous Fluid: 71 % (ref 50–90)
NEUTROPHIL FLUID: 8 % (ref 0–25)
WBC FLUID: 28 uL (ref 0–1000)

## 2014-02-02 LAB — COMPREHENSIVE METABOLIC PANEL
ALT: 7 U/L (ref 0–35)
AST: 18 U/L (ref 0–37)
Albumin: 1.2 g/dL — ABNORMAL LOW (ref 3.5–5.2)
Alkaline Phosphatase: 43 U/L (ref 39–117)
BUN: 89 mg/dL — AB (ref 6–23)
CALCIUM: 6.3 mg/dL — AB (ref 8.4–10.5)
CO2: 13 meq/L — AB (ref 19–32)
CREATININE: 10.04 mg/dL — AB (ref 0.50–1.10)
Chloride: 98 mEq/L (ref 96–112)
GFR, EST AFRICAN AMERICAN: 5 mL/min — AB (ref >90)
GFR, EST NON AFRICAN AMERICAN: 5 mL/min — AB (ref >90)
GLUCOSE: 126 mg/dL — AB (ref 70–99)
Potassium: 4.7 mEq/L (ref 3.7–5.3)
Sodium: 132 mEq/L — ABNORMAL LOW (ref 137–147)
Total Bilirubin: <0.2 mg/dL — ABNORMAL LOW (ref 0.3–1.2)
Total Protein: 7.5 g/dL (ref 6.0–8.3)

## 2014-02-02 LAB — CBC
HCT: 30.3 % — ABNORMAL LOW (ref 36.0–46.0)
Hemoglobin: 10.6 g/dL — ABNORMAL LOW (ref 12.0–15.0)
MCH: 27.4 pg (ref 26.0–34.0)
MCHC: 35 g/dL (ref 30.0–36.0)
MCV: 78.3 fL (ref 78.0–100.0)
Platelets: 223 K/uL (ref 150–400)
RBC: 3.87 MIL/uL (ref 3.87–5.11)
RDW: 15.5 % (ref 11.5–15.5)
WBC: 14.4 K/uL — ABNORMAL HIGH (ref 4.0–10.5)

## 2014-02-02 LAB — ALBUMIN, FLUID (OTHER): Albumin, Fluid: 0.5 g/dL

## 2014-02-02 LAB — LACTATE DEHYDROGENASE
LDH: 485 U/L — ABNORMAL HIGH (ref 94–250)
LDH: 332 U/L — AB (ref 94–250)

## 2014-02-02 LAB — LACTATE DEHYDROGENASE, PLEURAL OR PERITONEAL FLUID: LD FL: 68 U/L — AB (ref 3–23)

## 2014-02-02 LAB — PROTEIN, BODY FLUID: Total protein, fluid: 2.2 g/dL

## 2014-02-02 LAB — PROTEIN, TOTAL: Total Protein: 7.2 g/dL (ref 6.0–8.3)

## 2014-02-02 MED ORDER — SODIUM CHLORIDE 0.9 % IV SOLN: 100.0000 mL | INTRAVENOUS | Status: DC | PRN

## 2014-02-02 MED ORDER — HEPARIN SODIUM (PORCINE) 1000 UNIT/ML DIALYSIS
1000.0000 [IU] | INTRAMUSCULAR | Status: DC | PRN
  Filled 2014-02-02: qty 1

## 2014-02-02 MED ORDER — NEPRO/CARBSTEADY PO LIQD: 237.0000 mL | ORAL | Status: DC | PRN

## 2014-02-02 MED ORDER — AMLODIPINE BESYLATE 5 MG PO TABS
5.0000 mg | ORAL_TABLET | Freq: Every day | ORAL | Status: DC
  Administered 2014-02-03: 5 mg via ORAL
  Filled 2014-02-02 (×2): qty 1

## 2014-02-02 MED ORDER — OXYCODONE HCL 5 MG PO TABS
5.0000 mg | ORAL_TABLET | Freq: Once | ORAL | Status: AC
  Administered 2014-02-02: 5 mg via ORAL

## 2014-02-02 MED ORDER — ALTEPLASE 2 MG IJ SOLR
2.0000 mg | Freq: Once | INTRAMUSCULAR | Status: DC | PRN
  Filled 2014-02-02: qty 2

## 2014-02-02 MED ORDER — DOXERCALCIFEROL 4 MCG/2ML IV SOLN
2.0000 ug | INTRAVENOUS | Status: DC
  Administered 2014-02-03 – 2014-02-08 (×3): 2 ug via INTRAVENOUS
  Filled 2014-02-02 (×3): qty 2

## 2014-02-02 MED ORDER — OXYCODONE HCL 5 MG PO TABS
ORAL_TABLET | ORAL | Status: AC
  Filled 2014-02-02: qty 1

## 2014-02-02 MED ORDER — SODIUM CHLORIDE 0.9 % IV SOLN
100.0000 mL | INTRAVENOUS | Status: DC | PRN
Start: 2014-02-02 — End: 2014-02-02

## 2014-02-02 NOTE — Progress Notes (Addendum)
Medical Student Daily Progress Note  Subjective: No events overnight. Patient had renal biopsy on 2/5, and thoracentesis in AM 2/6.   Patient has pain at the sight of biopsy in left lower back and abdominal pain in left mid to upper quadrant. Pain level at 3/10, controlled with Tylenol.  Patient had minor headache treated with Tylenol, and reports that she noticed the headache after her blood pressure went down to SBP<140. Her baseline BP on admission was in 140-160 SBP. Patient denies any blurry vision, nausea, or chest pain.   Objective: Vital signs in last 24 hours: Filed Vitals:   02/01/14 1550 02/01/14 1700 02/01/14 2042 02/02/14 0445  BP: 120/75 114/70 129/83 131/89  Pulse: 93 88 82 85  Temp: 97.5 F (36.4 C) 98 F (36.7 C) 97.9 F (36.6 C) 98.2 F (36.8 C)  TempSrc: Oral Oral Oral Oral  Resp: _0 Height:      Weight:      SpO2:  98% 99% 98%   Weight change:   Intake/Output Summary (Last 24 hours) at 02/02/14 0806 Last data filed at 02/01/14 2300  Gross per 24 hour  Intake    120 ml  Output      0 ml  Net    120 ml  Intake: >1266m per patient report Out: urinated 2x at 2014meach time Net: + 8004mPhysical Exam: BP 131/89  Pulse 85  Temp(Src) 98.2 F (36.8 C) (Oral)  Resp 20  Ht _1  (1.626 m)  Wt 56.609 kg (124 lb 12.8 oz)  BMI 21.41 kg/m2  SpO2 98%  LMP 12/27/2013 General appearance: alert, cooperative and no distress Throat: hypopigmented macule on right tongue Neck: no carotid bruit, no JVD and supple, symmetrical, trachea midline Back: CVA tenderness on left lower back, at site of biopsy, no erythema or purulent discharge from biopsy site Lungs: diminished breath sounds base - left and LLL, dullness to percussion base - left and LLL, egophony base - left and LLL and the level of fluid dullness has progress 2 centimeters below what it was on admission. Chest has tunneled catheter placed. Dry wound with gauze and clear tap, no drainage, no  erythema around catheter site.  Heart: regular rate and rhythm, S1, S2 normal, no murmur, click, rub or gallop Abdomen: soft, slightly distended compared to 2/5, tenderness in left upper to mid section about the same place as left kidney where biopsy was taken. Site of biopsy clean, non erythematous, no drainage. 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 Lab Results: CBC    Component Value Date/Time   WBC 14.4* 02/02/2014 1100   RBC 3.87 02/02/2014 1100   RBC 2.68* 01/31/2014 0536   HGB 10.6* 02/02/2014 1100   HCT 30.3* 02/02/2014 1100   PLT 223 02/02/2014 1100   MCV 78.3 02/02/2014 1100   MCH 27.4 02/02/2014 1100   MCHC 35.0 02/02/2014 1100   RDW 15.5 02/02/2014 1100   LYMPHSABS 0.6* 01/30/2014 0616   MONOABS 0.1 01/30/2014 0616   EOSABS 0.1 01/30/2014 0616   BASOSABS 0.0 01/30/2014 0616   Lab Results  Component Value Date   CREATININE 10.04* 02/02/2014   BUN 89* 02/02/2014   NA 132* 02/02/2014   K 4.7 02/02/2014   CL 98 02/02/2014   CO2 13* 02/02/2014   PTH    Component Value Date/Time   PTH 437.5* 01/31/2014 1309   Lab Results  Component  Value Date   CALCIUM 6.3* 02/02/2014   PHOS 6.5* 02/01/2014   Lab Results  Component Value Date   ALT 7 02/02/2014   AST 18 02/02/2014   ALKPHOS 43 02/02/2014   BILITOT <0.2* 02/02/2014   Serum Albumin: 43 Serum total protein:7.5 Serum LDH: 485  Pleural fluid Results for [name], [name] (MRN [id number]) as of 02/02/2014 12:11  Ref. Range 02/02/2014 10:38  Albumin, Fluid No range found 0.5  Fluid Type-FALB No range found FLUID  Glucose, Fluid No range found 150  Fluid Type-FGLU No range found FLUID  Fluid Type-FLDH No range found FLUID  LD, Fluid Latest Range: 3-23 U/L 68 (H)  Total protein, fluid No range found 2.2  Fluid Type-FTP No range found FLUID    Micro Results: No results found for this or any previous visit (from the past 240 hour(s)). Studies/Results: US  Biopsy  02/01/2014   CLINICAL DATA:  Lupus and renal failure. The patient requires renal biopsy to assess degree of kidney disease.  EXAM: ULTRASOUND GUIDED CORE BIOPSY OF LEFT KIDNEY  MEDICATIONS: 2.0 mg IV Versed; 100 mcg IV Fentanyl  Total Moderate Sedation Time: 11 min.  PROCEDURE: The procedure, risks, benefits, and alternatives were explained to the patient. Questions regarding the procedure were encouraged and answered. The patient understands and consents to the procedure.  The left flank region was prepped with Betadine in a sterile fashion, and a sterile drape was applied covering the operative field. A sterile gown and sterile gloves were used for the procedure. Local anesthesia was provided with 1% Lidocaine.  Ultrasound was performed of both kidneys. The left was chosen for biopsy. Under direct ultrasound guidance, a 16 gauge core device was advanced into the lower pole cortex of the left kidney. Two samples were obtained and submitted in saline.  COMPLICATIONS: None.  FINDINGS: Both kidneys are markedly echogenic which represents significant progression in appearance of the kidneys since a prior biopsy procedure in 2006. No hydronephrosis is identified. Solid tissue samples were obtained from the left lower pole cortex. There were no immediate bleeding complications.  IMPRESSION: Ultrasound-guided core biopsy performed of the lower pole of the left kidney.   Electronically Signed   By: Aletta Edouard M.D.   On: 02/01/2014 16:34   Dg Chest Port 1v Same Day  01/31/2014   CLINICAL DATA:  Dialysis catheter placement.  EXAM: PORTABLE CHEST - 1 VIEW SAME DAY  COMPARISON:  DG CHEST 2 VIEW dated 01/29/2014  FINDINGS: Interim placement of dialysis catheter. Tip is at the cavoatrial junction. Cardiomegaly, normal pulmonary vascularity. Persistent large left-sided pleural effusion. Left base atelectasis and/or pneumonia cannot be excluded. Small right pleural effusion not identified on today's exam . No  pneumothorax. No acute osseous abnormality.  IMPRESSION: 1. And replacement dialysis catheter with tips at the cavoatrial junction. 2. Cardiomegaly, no pulmonary venous congestion. 3. Persistent large left-sided pleural effusion. Left base atelectasis and/or pneumonia cannot be excluded. These findings are stable from prior exam. Small right pleural effusion not identified on today's exam.   Electronically Signed   By: [place]   On: 01/31/2014 13:35   Medications: I have reviewed the patient's current medications. Scheduled Meds: . amLODipine  10 mg Oral Daily  . calcium carbonate  5 tablet Oral TID AC  . darbepoetin (ARANESP) injection - DIALYSIS  60 mcg Intravenous Q Wed-HD  . predniSONE  60 mg Oral Q breakfast   Continuous Infusions:  PRN Meds:.acetaminophen, cloNIDine, hydrALAZINE, labetalol Assessment/Plan: Principal Problem:  ESRD on dialysis Active Problems:   Acute systemic lupus erythematosus   Lupus nephritis   Acute renal failure   Microcytic anemia   Hypocalcemia   Acute uremia   Pleural effusion, left   Metabolic acidosis, increased anion gap (IAG)   Hypoalbuminemia   Nephrotic syndrome   LOS: 4 days   [name] is a 33yo woman with history of lupus nephritis untreated for 5 years who presents with ARF and L transudative pleural effusion in the setting of lupus nephritis flare. Patient is waiting for renal biopsy results.  1. Acute Renal Failure with FEna of 12.2% in the setting of untreated lupus nephritis for 5 years: Intrarenal cause of acute renal failure. Baseline Cr is at 1-2. Today, Cr is 10.04, BUN is 89, down from admission, but up from Cr of 9.35 and BUN of 70 from the day before. Renal ultrasound showed normal size kidneys with increased echogenicity. Patient has tunneled catheter placed for temporary RRT (2nd dialysis on 02/02/2014), but will likely need permanent RRT per Nephrology. HIV and Hepatitis cause of kidney diseases are ruled out due to  nonreactive HIV antibody and hepatitis panel. Please see individual problems for management.  -Dialysis today, treatment #2 -Appreciate Nephrology Recs -Waiting for renal biopsy results for treatment course -Renal diet  -Strict I/Os  -Daily renal labs, and CBC   2. Elevated WBC to 14.4 Patient has isolated asymptomatic WBC elevation in the setting of using high glucocorticoids and meets no other SIRS criteria. Patient does have a pleural effusion, but that has been removed, and the fluid is found to be transudative. Elevated WBC most likely due to glucocorticoid use.  -Monitor for SIRS criteria and symptoms  3. High blood pressure without organ damage: Patient was dialyzed on 01/31/2014, and had blood pressure >190 after. Currently, patient has experienced minor headache from sudden decrease of BP to <140/90. -Clonidine 0.52m q6H PRN, given first while BP>165/90.  -Hydralazine 5345mq4H PRN if BP>170  -Amlodipine 45m61mD, decreased from 60m47me to symptoms of headache with rapidly lowered blood pressure  4. SLE and lupus nephritis flare: patient has a history of lupus nephritis and has not been treated for 5 years. She has symptoms of flare with elevated ESR of 139, low C3 (59), though normal C4 level of 32, renal symptoms as described above, skin changes with livedo reticularis on leg starting in December.  -Methylprednisolone sodium succinate 1,000mg33mNS 50mL 48m  -Hold mycophenolate pending status of renal ultrasound/possible biopsy (urine pregnancy is negative should this medication is to be used)   5. Pleural Effusion: CXR shows significant left pleural effusion, and diagnostic thoracentesis shows transudative fluid with fluid protein/ serum protein at <0.5, fluid LDH/serum LDH<0.6, and fluid LDH is not >upper 2/3 of serum LDH, indicative of a nephrotic syndrome. However, serum-effusion albumin difference is 0.6, indicating an exudative process, but the serum-effusion albumin difference of  <1.2 is only specific in CHF patients. Thus, we will follow Light's criteria. LFTs are normal (AST 11, ALT 6). No pneumothorax on CXR after diagnostic and therapeutic thoracentesis.  -Monitor for re-expansion pulmonary edema  6. Microcytic Anemia: Hgb of 10.6. Hgb of 10.4 after 2 pRBC transfusion with pre-transfusion hgb of 6.8.  -Darbepoetin injection 60mcg 53mly   7. Acidosis: CO2 of 13 on 02/02/2014  -stopped sodium bicarbonate as CO2 is corrected for loss. Currently, need to treat underlying renal failure and uremia to correct CO2 fully.  8. Secondary Hyperthyroidism with hyperphosphatemia and hypocalcemia: phos of  6.5 and calcium of 6.3 on 02/02/2014. PTH of 437.5. Phos goal 3.5-5.5, Calcium goal 8.4-9.5, and PTH goal 150-300. Studies have shown increased mortality when PTH is elevated at >300. Thus, when treatment for hyperphosphatemia and hypocalcemia have not corrected PTH, VDRA should be considered. Patient only reports feeling weak, but does not notice tingling sensation, chest pain, or palpitations.  -Calcium carbonate 1011m qAC  -Repeat PTH for new levels after current treatment. Possibly need paricalcitol if PTH remains elevated from admission.  Prophylaxis:  -DVT: intermittent pneumatic compression    This is a MCareers information officerNote.  The care of the patient was discussed with Dr. VTommy Medaland the assessment and plan formulated with their assistance.  Please see their attached note for official documentation of the daily encounter.  JKonrad Saha2/05/2014, 8:06 AM

## 2014-02-02 NOTE — Care Management Note (Signed)
   CARE MANAGEMENT NOTE 02/02/2014  Patient:  [name], [name]   Account Number:  [id number]  Date Initiated:  02/02/2014  Documentation initiated by:  Ebony Rickel  Subjective/Objective Assessment:   Pt with lupus,anemia, s/p RBC x 2 and renal bx. Hemodialysis started.     Action/Plan:   P4[facility] following this pt so Medicaid may be in place. Will follow for progression and notify finance if pt is deemed Chronic renal failure.   Anticipated DC Date:     Anticipated DC Plan:  [place]         Choice offered to / List presented to:             Status of service:  In process, will continue to follow Medicare Important Message given?   (If response is "NO", the following Medicare IM given date fields will be blank) Date Medicare IM given:   Date Additional Medicare IM given:    Discharge Disposition:    Per UR Regulation:    If discussed at Long Length of Stay Meetings, dates discussed:    Comments:

## 2014-02-02 NOTE — Progress Notes (Signed)
See above excellent not. Greatly appreciate CCM and Nephrology's help here. Patient for HD today again. Renal Bx results likeley back Monday. Continue high dose steroids. Effusion seems to be a transudate.

## 2014-02-02 NOTE — Progress Notes (Signed)
[name] chest showed left pleural effusion seen as echo free space bounded by chest wall, diaphragm & atelectatic lung. Pic inchart Post thoracentesis US showed no pneumothorax  ALVA,RAKESH V.

## 2014-02-02 NOTE — Progress Notes (Signed)
CRITICAL VALUE ALERT  Critical value received:  Calcium 6.3  Date of notification: 02/02/14  Time of notification:  12:00 Critical value read back:yes  Nurse who received alert:  Henry Russel  MD notified (1st page): Konrad Saha, Sub-intern: [id number], was paged at 12:06   Konrad Saha responded at 12:15. No new orders given at this time. Will continue to monitor patient closely.

## 2014-02-02 NOTE — Procedures (Signed)
I was present at this dialysis session. I have reviewed the session itself and made appropriate changes.   HD #2 today, #3 tomorrow and then on regular schedule. S/p thoracentesis today.  I called [facility] neph[facility], will not have any prelim results today, hopefully on Monday.   PTH 435, PHos last 6.5.  On Tums.  Add VDRA No heparin 2/2 renal biopsy 02/01/14 Hb stable On 60 pred now  Pearson Grippe  MD 02/02/2014, 2:34 PM

## 2014-02-03 DIAGNOSIS — Z01810 Encounter for preprocedural cardiovascular examination: Secondary | ICD-10-CM

## 2014-02-03 LAB — BASIC METABOLIC PANEL
BUN: 54 mg/dL — ABNORMAL HIGH (ref 6–23)
CO2: 21 meq/L (ref 19–32)
Calcium: 6.6 mg/dL — ABNORMAL LOW (ref 8.4–10.5)
Chloride: 102 mEq/L (ref 96–112)
Creatinine, Ser: 6.5 mg/dL — ABNORMAL HIGH (ref 0.50–1.10)
GFR calc Af Amer: 9 mL/min — ABNORMAL LOW (ref >90)
GFR calc non Af Amer: 8 mL/min — ABNORMAL LOW (ref >90)
Glucose, Bld: 93 mg/dL (ref 70–99)
Potassium: 4 mEq/L (ref 3.7–5.3)
SODIUM: 137 meq/L (ref 137–147)

## 2014-02-03 LAB — CBC
HEMATOCRIT: 26.8 % — AB (ref 36.0–46.0)
Hemoglobin: 9.2 g/dL — ABNORMAL LOW (ref 12.0–15.0)
MCH: 27.1 pg (ref 26.0–34.0)
MCHC: 34.3 g/dL (ref 30.0–36.0)
MCV: 78.8 fL (ref 78.0–100.0)
Platelets: 206 10*3/uL (ref 150–400)
RBC: 3.4 MIL/uL — AB (ref 3.87–5.11)
RDW: 15.4 % (ref 11.5–15.5)
WBC: 14 10*3/uL — ABNORMAL HIGH (ref 4.0–10.5)

## 2014-02-03 MED ORDER — CALCIUM CARBONATE ANTACID 500 MG PO CHEW
800.0000 mg | CHEWABLE_TABLET | Freq: Three times a day (TID) | ORAL | Status: DC
  Administered 2014-02-03 – 2014-02-08 (×11): 800 mg via ORAL
  Filled 2014-02-03 (×18): qty 4

## 2014-02-03 MED ORDER — DOXERCALCIFEROL 4 MCG/2ML IV SOLN
INTRAVENOUS | Status: AC
  Administered 2014-02-03: 2 ug via INTRAVENOUS
  Filled 2014-02-03: qty 2

## 2014-02-03 MED ORDER — DARBEPOETIN ALFA-POLYSORBATE 60 MCG/0.3ML IJ SOLN
INTRAMUSCULAR | Status: AC
  Administered 2014-02-03: 60 ug via INTRAVENOUS
  Filled 2014-02-03: qty 0.3

## 2014-02-03 NOTE — Procedures (Signed)
I was present at this dialysis session. I have reviewed the session itself and made appropriate changes.   Goal UF 3L.  Steep critline but normotensive.  Pt w/o complaints.  I have asked [facility] to pursue permanent access.  Will f/u on biopsy findings on Monday.    Pearson Grippe  MD 02/03/2014, 2:52 PM

## 2014-02-03 NOTE — Progress Notes (Signed)
I have seen the patient and reviewed the daily progress note by Elvin So MS IV and discussed the care of the patient with them.   Signed:  Jessee Avers, MD PGY-2 Internal Medicine Teaching Service Pager: [phone]

## 2014-02-03 NOTE — Progress Notes (Signed)
I have seen the patient and reviewed the daily progress note by Elvin So MS IV and discussed the care of the patient with them.  Please see note for my findings, assessment, and plans/additions.   Signed:  Jessee Avers, MD PGY-2 Internal Medicine Teaching Service Pager: [phone]

## 2014-02-03 NOTE — Progress Notes (Signed)
Medical Student Daily Progress Note  Subjective: No events overnight. Patient had 2480mL of fluids removed yesterday on dialysis, and is planned to have dialysis #3 today.   Patient reports feeling dizzy in dialysis, but was fine after returning to the floor. Patient denies headaches, dizziness, or weakness. She continues to report having a distended abdomen, but no abdominal pain.   Objective: Vital signs in last 24 hours: Filed Vitals:   02/02/14 1805 02/02/14 2103 02/03/14 0445 02/03/14 0849  BP: 143/91 144/96 159/90 154/101  Pulse: 79 87 96 83  Temp: 98.2 F (36.8 C) 98.7 F (37.1 C) 98.4 F (36.9 C) 97.8 F (36.6 C)  TempSrc: Oral Oral Oral Oral  Resp: $Remo'18 20 17 18  'dMBwq$ Height:      Weight:      SpO2: 98% 97% 94% 98%   Weight change:   Intake/Output Summary (Last 24 hours) at 02/03/14 1201 Last data filed at 02/03/14 1140  Gross per 24 hour  Intake    360 ml  Output   2490 ml  Net  -2130 ml   Physical Exam: BP 154/101  Pulse 83  Temp(Src) 97.8 F (36.6 C) (Oral)  Resp 18  Ht $R'5\' 4"'sL$  (1.626 m)  Wt 58.9 kg (129 lb 13.6 oz)  BMI 22.28 kg/m2  SpO2 98%  LMP 12/27/2013 General appearance: alert and no distress Throat: Hypopigmented macule on right tongue Back: symmetric, no curvature. ROM normal. No CVA tenderness., left renal biopsy site clean and non-draining, left chest mid scapula thoracentesis site clean and non-draining Lungs: clear to auscultation bilaterally and no egophony or diminished breath on the left as compared to previous exams Heart: regular rate and rhythm, S1, S2 normal, no murmur, click, rub or gallop Abdomen: Soft, non tender, slightly distended, multiple stretch marks Extremities: extremities normal, atraumatic, no cyanosis or edema Skin: livedo reticularis darkening on bilateral calves Lab Results: CBC    Component Value Date/Time   WBC 14.0* 02/03/2014 0430   RBC 3.40* 02/03/2014 0430   RBC 2.68* 01/31/2014 0536   HGB 9.2* 02/03/2014 0430   HCT  26.8* 02/03/2014 0430   PLT 206 02/03/2014 0430   MCV 78.8 02/03/2014 0430   MCH 27.1 02/03/2014 0430   MCHC 34.3 02/03/2014 0430   RDW 15.4 02/03/2014 0430   LYMPHSABS 0.6* 01/30/2014 0616   MONOABS 0.1 01/30/2014 0616   EOSABS 0.1 01/30/2014 0616   BASOSABS 0.0 01/30/2014 0616   Lab Results  Component Value Date   CREATININE 6.50* 02/03/2014   BUN 54* 02/03/2014   NA 137 02/03/2014   K 4.0 02/03/2014   CL 102 02/03/2014   CO2 21 02/03/2014     Lab Results  Component Value Date   CALCIUM 6.6* 02/03/2014   PHOS 6.5* 02/01/2014   PTH    Component Value Date/Time   PTH 437.5* 01/31/2014 1309   Pleural fluid analysis:  Results for [name], [name] (MRN [id number]) as of 02/03/2014 12:24  Ref. Range 02/02/2014 10:38  Color, Fluid Latest Range: YELLOW  STRAW (A)  WBC, Fluid Latest Range: 0-1000 cu mm 28  Lymphs, Fluid No range found 21  Eos, Fluid No range found 0  Appearance, Fluid Latest Range: CLEAR  CLOUDY (A)  Other Cells, Fluid No range found RARE MESOTHELIAL CELL  Neutrophil Count, Fluid Latest Range: 0-25 % 8  Monocyte-Macrophage-Serous Fluid Latest Range: 50-90 % 71    Micro Results: No results found for this or any previous visit (from the past 240 hour(s)). Studies/Results: US  Biopsy  02/01/2014   CLINICAL DATA:  Lupus and renal failure. The patient requires renal biopsy to assess degree of kidney disease.  EXAM: ULTRASOUND GUIDED CORE BIOPSY OF LEFT KIDNEY  MEDICATIONS: 2.0 mg IV Versed; 100 mcg IV Fentanyl  Total Moderate Sedation Time: 11 min.  PROCEDURE: The procedure, risks, benefits, and alternatives were explained to the patient. Questions regarding the procedure were encouraged and answered. The patient understands and consents to the procedure.  The left flank region was prepped with Betadine in a sterile fashion, and a sterile drape was applied covering the operative field. A sterile gown and sterile gloves were used for the procedure. Local anesthesia was provided with 1% Lidocaine.  Ultrasound  was performed of both kidneys. The left was chosen for biopsy. Under direct ultrasound guidance, a 16 gauge core device was advanced into the lower pole cortex of the left kidney. Two samples were obtained and submitted in saline.  COMPLICATIONS: None.  FINDINGS: Both kidneys are markedly echogenic which represents significant progression in appearance of the kidneys since a prior biopsy procedure in 2006. No hydronephrosis is identified. Solid tissue samples were obtained from the left lower pole cortex. There were no immediate bleeding complications.  IMPRESSION: Ultrasound-guided core biopsy performed of the lower pole of the left kidney.   Electronically Signed   By: Aletta Edouard M.D.   On: 02/01/2014 16:34   Dg Chest Port 1 View  02/02/2014   CLINICAL DATA:  Thoracentesis.  EXAM: PORTABLE CHEST - 1 VIEW  COMPARISON:  US BIOPSY dated 02/01/2014; DG CHEST PORT 1VSAME DAY dated 01/31/2014; DG CHEST 2 VIEW dated 01/29/2014  FINDINGS: Decreased left pleural effusion, still moderate. There is no pneumothorax. Right IJ dialysis catheter is present. There is no right pleural effusion. The cardiopericardial silhouette appears similar in size and configuration compared to prior.  IMPRESSION: Left thoracentesis with persistent moderate left pleural effusion and collapse of the left lower lobe. No pneumothorax   Electronically Signed   By: Dereck Ligas M.D.   On: 02/02/2014 11:05   Medications: I have reviewed the patient's current medications. Scheduled Meds: . amLODipine  5 mg Oral Daily  . calcium carbonate  800 mg of elemental calcium Oral TID AC  . darbepoetin (ARANESP) injection - DIALYSIS  60 mcg Intravenous Q Wed-HD  . doxercalciferol  2 mcg Intravenous Q T,Th,Sa-HD  . predniSONE  60 mg Oral Q breakfast   Continuous Infusions:  PRN Meds:.acetaminophen, cloNIDine, hydrALAZINE Assessment/Plan: Principal Problem:   ESRD on dialysis Active Problems:   Acute systemic lupus erythematosus   Lupus  nephritis   Acute renal failure   Microcytic anemia   Hypocalcemia   Acute uremia   Pleural effusion, left   Metabolic acidosis, increased anion gap (IAG)   Hypoalbuminemia   Nephrotic syndrome   LOS: 5 days   [name] is a 33yo woman with history of lupus nephritis untreated for 5 years who presents with ARF and L transudative pleural effusion in the setting of lupus nephritis flare. Patient is receiving dialysis and having a permanent access placed while waiting for renal biopsy results.   1. Acute Renal Failure with FEna of 12.2% in the setting of untreated lupus nephritis for 5 years: Intrarenal cause of acute renal failure. Baseline Cr is at 1-2. Today, Cr is 6.5 and BUN is 54 post dialysis; down from admission. Renal ultrasound showed normal size kidneys with increased echogenicity. Patient has tunneled catheter placed for temporary RRT (2nd dialysis on 02/02/2014), and  Nephrology plans to place permanent access on patient. HIV and Hepatitis cause of kidney diseases are ruled out due to nonreactive HIV antibody and hepatitis panel. Please see individual problems for management.  -Dialysis today, treatment #3  -Bilateral arm vein mapping per vascular surgery, preparing to place permanent access -Appreciate Nephrology Recs  -Waiting for renal biopsy results for treatment course  -Renal diet  -Strict I/Os  -Daily renal labs, and CBC   2. High blood pressure without organ damage: Patient was dialyzed on 01/31/2014, and had blood pressure >190 after. Patient has experienced minor headache from sudden decrease of BP to <140/90.  -Clonidine 0.2mg  q6H PRN, given first while BP>165/90.  -Hydralazine 5mg  q4H PRN if BP>170  -Amlodipine 5mg  qD, decreased from 10mg  due to symptoms of headache with rapidly lowered blood pressure   3. SLE and lupus nephritis flare: patient has a history of lupus nephritis and has not been treated for 5 years. She has symptoms of flare with elevated ESR of 139, low C3  (59), though normal C4 level of 32, renal symptoms as described above, skin changes with livedo reticularis on leg starting in December.  -Prednisone 60mg  qD -Hold mycophenolate pending status of renal ultrasound/possible biopsy (urine pregnancy is negative should this medication is to be used)   4. Pleural Effusion: CXR shows significant left pleural effusion, and diagnostic thoracentesis shows transudative fluid with fluid protein/ serum protein at <0.5, fluid LDH/serum LDH<0.6, and fluid LDH is not >upper 2/3 of serum LDH, indicative of a nephrotic syndrome. However, serum-effusion albumin difference is 0.6, indicating an exudative process, but the serum-effusion albumin difference of <1.2 is only specific in CHF patients. Thus, we will follow Light's criteria. Cell count in fluid does not reveal empyema with WBC of 28, and lymphs of 21. LFTs are normal (AST 11, ALT 6). No pneumothorax on CXR after diagnostic and therapeutic thoracentesis.  -Monitor for re-expansion pulmonary edema   5. Microcytic Anemia: Hgb of 9.2 down from Hgb of 10.6 after 2 pRBC transfusion with pre-transfusion hgb of 6.8.  -Darbepoetin injection 62mcg weekly  -Transfuse if Hgb<7  6. Acidosis: CO2 of 21 on 02/03/2014  -stopped sodium bicarbonate as CO2 is corrected for loss. Currently, need to treat underlying renal failure and uremia to correct CO2 fully.   7. Secondary Hyperthyroidism with hyperphosphatemia and hypocalcemia: phos of 6.5 and calcium of 6.3 on 02/02/2014. PTH of 437.5. Phos goal 3.5-5.5, Calcium goal 8.4-9.5, and PTH goal 150-300. Studies have shown increased mortality when PTH is elevated at >300. VDRA on board to correct PTH as elevated PTH in patients with CKD has shown to have greater mortality.  -Calcium carbonate 1000mg  qAC  -Repeat PTH for new levels after current treatment.  -Doxercalciferol injection 63mcg  8. Elevated WBC to 14.4 Patient has isolated asymptomatic WBC elevation in the setting of using  high glucocorticoids and meets no other SIRS criteria. Patient does have a pleural effusion, but that has been removed, and the fluid is found to be transudative. Elevated WBC most likely due to glucocorticoid use.  -Monitor for SIRS criteria and symptoms   Prophylaxis:  -DVT: intermittent pneumatic compression.    This is a Careers information officer Note.  The care of the patient was discussed with Dr. Tommy Medal and the assessment and plan formulated with their assistance.  Please see their attached note for official documentation of the daily encounter.  Konrad Saha 02/03/2014, 12:01 PM

## 2014-02-03 NOTE — Progress Notes (Addendum)
Subjective: Patient feels better. No other complaints today. No fevers overnight.  Objective: Vital signs in last 24 hours: Filed Vitals:   02/03/14 1500 02/03/14 1530 02/03/14 1600 02/03/14 1630  BP: 142/96 141/93 139/94 142/100  Pulse: 82 87 84 91  Temp:      TempSrc:      Resp:      Height:      Weight:      SpO2:       Weight change:   Intake/Output Summary (Last 24 hours) at 02/03/14 1633 Last data filed at 02/03/14 1140  Gross per 24 hour  Intake    360 ml  Output   2490 ml  Net  -2130 ml   General: resting in bed, no acute distress. Not in acute distress.  Cardiac: RRR, no rubs, murmurs or gallops Pulm: reduced air movement in the lower left lung zones, dull percussion on the left posterior. Air movent is normal on right side. Not in respiratory distress at rest.  Right IJ catheter site looks clean. No signs of infection Abd: soft, nontender, nondistended, BS normoactive Ext: warm and well perfused, trace pretibial pitting edema  Skin: livido reticularis of b/l LE Neuro: alert and oriented X3, cranial nerves II-XII grossly intact  Lab Results: Basic Metabolic Panel:  Recent Labs Lab 01/29/14 1818  01/30/14 0020  01/31/14 0536  02/01/14 0448 02/02/14 1100 02/03/14 0430  NA 137  --   --   < > 135*  --  138 132* 137  K 5.2  --   --   < > 5.3  --  4.5 4.7 4.0  CL 107  --   --   < > 105  --  104 98 102  CO2 11*  --   --   < > 10*  --  16* 13* 21  GLUCOSE 82  --   --   < > 125*  --  120* 126* 93  BUN 77*  --   --   < > 85*  --  70* 89* 54*  CREATININE 13.27*  --   --   < > 13.22*  --  9.35* 10.04* 6.50*  CALCIUM 6.0*  --   --   < > 6.7*  < > 6.5* 6.3* 6.6*  MG  --   --  1.5  --   --   --   --   --   --   PHOS  --   < > 10.5*  --  10.2*  --  6.5*  --   --   < > = values in this interval not displayed.  Liver Function Tests:  Recent Labs Lab 01/30/14 0616  02/01/14 0448 02/02/14 1100 02/02/14 1333  AST 11  --   --  18  --   ALT 6  --   --  7  --    ALKPHOS 43  --   --  43  --   BILITOT <0.2*  --   --  <0.2*  --   PROT 6.9  --   --  7.5 7.2  ALBUMIN 1.0*  < > 1.2* 1.2*  --   < > = values in this interval not displayed. CBC:  Recent Labs Lab 01/30/14 0616  02/02/14 1100 02/03/14 0430  WBC 5.9  < > 14.4* 14.0*  NEUTROABS 5.2  --   --   --   HGB 7.0*  < > 10.6* 9.2*  HCT 20.3*  < >  30.3* 26.8*  MCV 73.8*  < > 78.3 78.8  PLT 237  < > 223 206  < > = values in this interval not displayed. Coagulation:  Recent Labs Lab 01/30/14 0020  LABPROT 14.4  INR 1.14   Anemia Panel:  Recent Labs Lab 01/30/14 0020 01/31/14 0536  FERRITIN 133  --   TIBC 127*  --   IRON 19*  --   RETICCTPCT  --  1.0   Urinalysis:  Recent Labs Lab 01/30/14 1141  COLORURINE YELLOW  LABSPEC 1.016  PHURINE 6.0  GLUCOSEU NEGATIVE  HGBUR MODERATE*  BILIRUBINUR NEGATIVE  KETONESUR NEGATIVE  PROTEINUR >300*  UROBILINOGEN 0.2  NITRITE NEGATIVE  LEUKOCYTESUR NEGATIVE   Studies/Results: Dg Chest Port 1 View  02/02/2014   CLINICAL DATA:  Thoracentesis.  EXAM: PORTABLE CHEST - 1 VIEW  COMPARISON:  US BIOPSY dated 02/01/2014; DG CHEST PORT 1VSAME DAY dated 01/31/2014; DG CHEST 2 VIEW dated 01/29/2014  FINDINGS: Decreased left pleural effusion, still moderate. There is no pneumothorax. Right IJ dialysis catheter is present. There is no right pleural effusion. The cardiopericardial silhouette appears similar in size and configuration compared to prior.  IMPRESSION: Left thoracentesis with persistent moderate left pleural effusion and collapse of the left lower lobe. No pneumothorax   Electronically Signed   By: Dereck Ligas M.D.   On: 02/02/2014 11:05   Medications: I have reviewed the patient's current medications. Scheduled Meds: . amLODipine  5 mg Oral Daily  . calcium carbonate  800 mg of elemental calcium Oral TID AC  . darbepoetin (ARANESP) injection - DIALYSIS  60 mcg Intravenous Q Wed-HD  . doxercalciferol  2 mcg Intravenous Q T,Th,Sa-HD  .  predniSONE  60 mg Oral Q breakfast   Continuous Infusions:  PRN Meds:.  Assessment/Plan: Ms. Weitz is a 33 yo woman with history of SLE & lupus nephritis admitted on 01/29/14 with chest pain, found to be in acute lupus flare - chest pain has since improved.   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 creatinine remains elevated. Also Elevated ESR, complicated by hypocalcemia, hyperphosphatemia and acidosis.  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 Renal u/s reveals progressive renal disease. Prot/Cr = 12. FEna 12%, LDL 116. HD catheter placed in right IJ on 01/31/2014. Patient has received 2 dialysis sessions during this admission. Renal biopsy on 02/01/2014 Plan  - appreciate nephrology in put.  - Getting her HD today - Bilateral arm vein mapping per vascular surgery, preparing to place permanent access - cont with Aranesp (60 qWed)   -Regarding hypoCa, hyperPhos, cont with tums 1g AC - awaiting results of renal biopsy - cont with prednisone 60 mg daily  # Severe Hypertension: likely related to her renal disease. Patient was dialyzed on 01/31/2014, and had blood pressure >190 after. BP currently well controled. Plan  -Clonidine 0.$RemoveBefor'2mg'ZyNBlWAKmXMS$  q6H PRN, given first while BP>165/90.  -Hydralazine $RemoveBefor'5mg'ViMUpXqYCSvq$  q4H PRN if BP>170  -Amlodipine $RemoveBefo'5mg'pgcDafWthqe$  qD  #Left sided Pleural effusion: Thoracocentesis performed by PCCM on 02/02/2014 with improvement in symptoms. Results from pleural fluid analysis consistent with a transudate. Etiology is likely renal failure. Patient does not appear volume overloaded.   # Microcytic anemia : Hb 8.3 --> 7, patient is asymptomatic at this time, ferritin 133, but iron 19, no active s/s of bleeding, likely related to ACD d/t renal failure, no menstrual cycle in 2 months; no hx of melena or bloody stools. Transfuse with 2 units of PRBC on 01/31/2014 Plan  - ESA as above  -  hemoglobin level is improved to 10.4 after 2 units of blood  transfusion. - goal 9-11 -  Monitor daily cbc  #SCD: hold heparin for procedure, SCDs, patient ambulatory  Dispo: Disposition is deferred at this time, awaiting improvement of current medical problems.  Anticipated discharge in approximately 2-3 day(s).   The patient does not have a current PCP (No Pcp Per Patient) and may need an [facility] follow-up appointment after discharge.  The patient does not have transportation limitations that hinder transportation to clinic appointments.  .Services Needed at time of discharge: Y = Yes, Blank = No PT:   OT:   RN:   Equipment:   Other:     LOS: 5 days   Signed:  Jessee Avers, MD PGY-2 Internal Medicine Teaching Service Pager: [phone] 02/03/2014, 4:33 PM    Date: 02/04/2014  Patient name: [name]  Medical record number: [id number]  Date of birth: Jun 12, 1981   I have seen and evaluated Pecola Lawless and discussed their care with the Residency Team.   Assessment and Plan: I have seen and evaluated the patient as outlined above. I agree with the formulated Assessment and Plan as detailed in the residents' admission note, with the following changes:   Discussed patient with Dr. Joelyn Oms with nephrology. He anticipates biopsy back early in the week. Unfortunately patient will undoubtedly will need long-term and permanent hemodialysis.  She is more comfortable today after thoracocentesis.  Truman Hayward, [place] 2/8/201511:30 AM

## 2014-02-03 NOTE — Progress Notes (Signed)
Right  Upper Extremity Vein Map    Cephalic  Segment Diameter Depth Comment  1. Axilla mm mm   2. Mid upper arm 1.63mm mm   3. Above AC 0.67mm mm   4. In AC 1.70mm mm   5. Below AC 0.97mm mm   6. Mid forearm mm mm   7. Wrist mm mm    mm mm    mm mm    mm mm    Basilic  Segment Diameter Depth Comment  1. Axilla mm mm Can't visualize  2. Mid upper arm mm mm Can't visualize  3. Above AC mm mm Can't visualize  4. In AC 2.66mm 36mm   5. Below AC 1.78mm 41mm   6. Mid forearm 1.30mm 2.72mm branch  7. Low forearm 2.19mm 2.33mm branch  8.Wrist 2.59mm 2.55mm            Brachial      1. AC 4.66mm 3.17mm   2. Mid upper arm 4.75mm 57mm   3. Origin 5.39mm 30mm          Left Upper Extremity Vein Map    Cephalic--Not visualized    Basilic  Segment Diameter Depth Comment  1. Axilla mm mm   2. Mid upper arm 5.26mm 54mm   3. Above AC 3.91mm 11mm   4. In [facility] 4.54mm 69mm   5. Below AC 2.77mm 14mm   6. Mid forearm 2.72mm 5.5mm         BRACHIAL mm mm   1.AC 4.54mm 7.8mm   2.mid upper arm 3.25mm 1.2mm   3.origin 67mm 29mm

## 2014-02-03 NOTE — Progress Notes (Signed)
    Per Nephrology, permanent access will be needed.  I have ordered bilateral arm vein mapping to determine her access options.  Once this is available, we will schedule the patient if she is willing to proceed.  Adele Barthel, MD [facility] Office: [phone] Pager: [phone]  02/03/2014, 9:31 AM

## 2014-02-04 LAB — BASIC METABOLIC PANEL
BUN: 45 mg/dL — AB (ref 6–23)
CALCIUM: 7 mg/dL — AB (ref 8.4–10.5)
CO2: 23 meq/L (ref 19–32)
Chloride: 102 mEq/L (ref 96–112)
Creatinine, Ser: 4.8 mg/dL — ABNORMAL HIGH (ref 0.50–1.10)
GFR calc Af Amer: 13 mL/min — ABNORMAL LOW (ref >90)
GFR calc non Af Amer: 11 mL/min — ABNORMAL LOW (ref >90)
GLUCOSE: 77 mg/dL (ref 70–99)
Potassium: 3.8 mEq/L (ref 3.7–5.3)
Sodium: 138 mEq/L (ref 137–147)

## 2014-02-04 LAB — CBC
HCT: 29.6 % — ABNORMAL LOW (ref 36.0–46.0)
Hemoglobin: 9.8 g/dL — ABNORMAL LOW (ref 12.0–15.0)
MCH: 26.7 pg (ref 26.0–34.0)
MCHC: 33.1 g/dL (ref 30.0–36.0)
MCV: 80.7 fL (ref 78.0–100.0)
Platelets: 232 10*3/uL (ref 150–400)
RBC: 3.67 MIL/uL — AB (ref 3.87–5.11)
RDW: 15.1 % (ref 11.5–15.5)
WBC: 15.6 10*3/uL — ABNORMAL HIGH (ref 4.0–10.5)

## 2014-02-04 MED ORDER — AMLODIPINE BESYLATE 10 MG PO TABS
10.0000 mg | ORAL_TABLET | Freq: Every day | ORAL | Status: DC
  Administered 2014-02-04 – 2014-02-08 (×4): 10 mg via ORAL
  Filled 2014-02-04 (×5): qty 1

## 2014-02-04 NOTE — Progress Notes (Signed)
Admit: 01/29/2014 LOS: 6  83F w/ hx/o IIIa/V SLE Nephritis (Bx 2006), CKD (BL SCr ~2 --> 13) not adherent to treatment x4-5y admitted with AKI, SCr 13, UP/C 12.  Pt also with L sided pleural effusion, dyspnea.    Subjective:  HD#2 yesterdya. 3L UF S/p Renal Bx 02/01/14 Has had thoracentesis VVS following and planning AVF On 60 pred Breathing well Good appetite  02/07 0701 - 02/08 0700 In: 2040 [P.O.:1080] Out: 3002 [Urine:2]  Filed Weights   02/03/14 1343 02/03/14 1643 02/03/14 2034  Weight: 60.3 kg (132 lb 15 oz) 57.8 kg (127 lb 6.8 oz) 58.6 kg (129 lb 3 oz)    Current meds: reviewed, s/p 3d pulse solumedrol 1g (2/2-2/4) Current Labs: reviewed    Physical Exam:  Blood pressure 133/90, pulse 82, temperature 98.1 F (36.7 C), temperature source Oral, resp. rate 19, height 5\' 4"  (1.626 m), weight 58.6 kg (129 lb 3 oz), last menstrual period 12/27/2013, SpO2 97.00%. NAD, awake, alert, appropriate RRR.  No rub. Nl s1s2 BS diminshed in L lower field.  CTA otherwise Trace to 1+ pitting edema Rash on legs unchanged Nonfocal, MAE NCAT  Assessment/Plan 1. AoCKD in setting of hx/o SLE III/V nephritis: HD #3 tomorrow.  No heparin s/p biopsy.  I believe the pt very likely is new ESRD.  Would like to review path findings first.   2. SLE Nephritis: Renal Bx today.  S/p pulse medrol 1g (2/2-2/4). Now on 60 mg pred daily.  Normal C3, C4, and bland UA suggest that this is not an aggressive, acute GN.  Might just be scarring, IF/TA.  Hope for some prelim path results 02/05/14 3. HTN: Amlodipine to 10mg .  Cont to inc UF goals at HD.  Stable now 4. Nephrotic Proteiniuria: as above. 5. Microcytic Anemia: Improved s/p 2u PRBC.  On ESA 6. Metabolic Acidosis: Improved with HD.  Stop PO NAHCO3 7. L pleural effusion: s/p thoracentsis 8. 2HPTH and Hyperphosphatemia: on Tums 1qAC.  PTH 438 on VDRA   Pearson Grippe MD 02/04/2014, 12:40 PM   Recent Labs Lab 01/30/14 0020  01/31/14 0536  02/01/14 0448  02/02/14 1100 02/03/14 0430 02/04/14 0615  NA  --   < > 135*  --  138 132* 137 138  K  --   < > 5.3  --  4.5 4.7 4.0 3.8  CL  --   < > 105  --  104 98 102 102  CO2  --   < > 10*  --  16* 13* 21 23  GLUCOSE  --   < > 125*  --  120* 126* 93 77  BUN  --   < > 85*  --  70* 89* 54* 45*  CREATININE  --   < > 13.22*  --  9.35* 10.04* 6.50* 4.80*  CALCIUM  --   < > 6.7*  < > 6.5* 6.3* 6.6* 7.0*  PHOS 10.5*  --  10.2*  --  6.5*  --   --   --   < > = values in this interval not displayed.  Recent Labs Lab 01/30/14 0616  02/02/14 1100 02/03/14 0430 02/04/14 0615  WBC 5.9  < > 14.4* 14.0* 15.6*  NEUTROABS 5.2  --   --   --   --   HGB 7.0*  < > 10.6* 9.2* 9.8*  HCT 20.3*  < > 30.3* 26.8* 29.6*  MCV 73.8*  < > 78.3 78.8 80.7  PLT 237  < > 223  206 232  < > = values in this interval not displayed.

## 2014-02-04 NOTE — Progress Notes (Signed)
Subjective: She is feeling better today. No new complaints today. Objective: Vital signs in last 24 hours: Filed Vitals:   02/03/14 2034 02/04/14 0612 02/04/14 0957 02/04/14 1735  BP: 160/99 154/104 133/90 136/92  Pulse: 62 65 82 72  Temp: 98.3 F (36.8 C) 98.3 F (36.8 C) 98.1 F (36.7 C) 97.1 F (36.2 C)  TempSrc: Oral Oral Oral Oral  Resp: 18 18 19 18   Height: 5\' 4"  (1.626 m)     Weight: 129 lb 3 oz (58.6 kg)     SpO2: 94% 98% 97% 98%   Weight change: -3 lb 1.4 oz (-1.4 kg)  Intake/Output Summary (Last 24 hours) at 02/04/14 2018 Last data filed at 02/04/14 1902  Gross per 24 hour  Intake   1900 ml  Output      2 ml  Net   1898 ml   General appearance: alert and no distress  Throat: Hypopigmented macule on right tongue  Back: symmetric, no curvature. ROM normal. No CVA tenderness., left renal biopsy site clean and non-draining, left chest mid scapula thoracentesis site clean and non-draining  Lungs: clear to auscultation bilaterally and no egophony or diminished breath on the left as compared to previous exams  Heart: regular rate and rhythm, S1, S2 normal, no murmur, click, rub or gallop  Abdomen: Soft, non tender, slightly distended, multiple stretch marks  Extremities: extremities normal, atraumatic, no cyanosis or edema  Skin: livedo reticularis darkening on bilateral calves  Lab Results: Basic Metabolic Panel:  Recent Labs Lab 01/29/14 1818  01/30/14 0020  01/31/14 0536  02/01/14 0448  02/03/14 0430 02/04/14 0615  NA 137  --   --   < > 135*  --  138  < > 137 138  K 5.2  --   --   < > 5.3  --  4.5  < > 4.0 3.8  CL 107  --   --   < > 105  --  104  < > 102 102  CO2 11*  --   --   < > 10*  --  16*  < > 21 23  GLUCOSE 82  --   --   < > 125*  --  120*  < > 93 77  BUN 77*  --   --   < > 85*  --  70*  < > 54* 45*  CREATININE 13.27*  --   --   < > 13.22*  --  9.35*  < > 6.50* 4.80*  CALCIUM 6.0*  --   --   < > 6.7*  < > 6.5*  < > 6.6* 7.0*  MG  --   --  1.5  --    --   --   --   --   --   --   PHOS  --   < > 10.5*  --  10.2*  --  6.5*  --   --   --   < > = values in this interval not displayed. Liver Function Tests:  Recent Labs Lab 01/30/14 0616  02/01/14 0448 02/02/14 1100 02/02/14 1333  AST 11  --   --  18  --   ALT 6  --   --  7  --   ALKPHOS 43  --   --  43  --   BILITOT <0.2*  --   --  <0.2*  --   PROT 6.9  --   --  7.5 7.2  ALBUMIN 1.0*  < > 1.2* 1.2*  --   < > = values in this interval not displayed. No results found for this basename: LIPASE, AMYLASE,  in the last 168 hours No results found for this basename: AMMONIA,  in the last 168 hours CBC:  Recent Labs Lab 01/30/14 0616  02/03/14 0430 02/04/14 0615  WBC 5.9  < > 14.0* 15.6*  NEUTROABS 5.2  --   --   --   HGB 7.0*  < > 9.2* 9.8*  HCT 20.3*  < > 26.8* 29.6*  MCV 73.8*  < > 78.8 80.7  PLT 237  < > 206 232  < > = values in this interval not displayed. Cardiac Enzymes:  Recent Labs Lab 01/30/14 0616  TROPONINI <0.30   BNP:  Recent Labs Lab 01/29/14 1818  PROBNP 32025.0*    Recent Labs Lab 01/30/14 0616  CHOL 190  HDL 25*  LDLCALC 116*  TRIG 244*  CHOLHDL 7.6   Coagulation:  Recent Labs Lab 01/30/14 0020  LABPROT 14.4  INR 1.14   Anemia Panel:  Recent Labs Lab 01/30/14 0020 01/31/14 0536  FERRITIN 133  --   TIBC 127*  --   IRON 19*  --   RETICCTPCT  --  1.0   Urine Drug Screen: Drugs of Abuse     Component Value Date/Time   LABOPIA NONE DETECTED 01/17/2013 1058   COCAINSCRNUR POSITIVE* 01/17/2013 1058   LABBENZ NONE DETECTED 01/17/2013 1058   AMPHETMU NONE DETECTED 01/17/2013 1058   THCU POSITIVE* 01/17/2013 1058   LABBARB NONE DETECTED 01/17/2013 1058    Urinalysis:  Recent Labs Lab 01/30/14 1141  COLORURINE YELLOW  LABSPEC 1.016  PHURINE 6.0  GLUCOSEU NEGATIVE  HGBUR MODERATE*  BILIRUBINUR NEGATIVE  KETONESUR NEGATIVE  PROTEINUR >300*  UROBILINOGEN 0.2  NITRITE NEGATIVE  LEUKOCYTESUR NEGATIVE    Scheduled Meds: .  amLODipine  10 mg Oral Daily  . calcium carbonate  800 mg of elemental calcium Oral TID AC  . darbepoetin (ARANESP) injection - DIALYSIS  60 mcg Intravenous Q Wed-HD  . doxercalciferol  2 mcg Intravenous Q T,Th,Sa-HD  . predniSONE  60 mg Oral Q breakfast   Continuous Infusions:  PRN Meds:.acetaminophen, cloNIDine, hydrALAZINE Assessment/Plan:  Ms. Hirons is a 33yo woman with history of lupus nephritis untreated for 5 years who presents with ARF and L transudative pleural effusion in the setting of lupus nephritis flare. Patient is receiving dialysis and having a permanent access placed while waiting for renal biopsy results.   # Acute Renal Failure 2/2 lupus nephritis: Intrarenal cause of acute renal failure associate with mild volume overload and pleural effusion which has been drained. Cr 13 on admission Plan  - awaiting results from renal biopsy  - HD cath placed 2/4 and started on HD, having another HD treatment tomorrow - cont with oral prednisone 60 mg daily -Vein mapping in prep for possible permanent HD access -Appreciate Nephrology Recs  -Renal diet  -Strict I/Os    #Hypertension: better controlled with Amlodipine 10 mg daily increased from 5 mg today. Has orders for prn medications as below.  -Clonidine 0.2mg  q6H PRN, given first while BP>165/90.  -Hydralazine 5mg  q4H PRN if BP>170    # Pleural Effusion: thoracentesis with fluid analysis consistent with transudate. She is breathing better with exertion after the fluid drainiage  # Microcytic Anemia: likely related to CKD. She was transfused with 2 pRBC transfusion with Hgb 6.8 >>9.8.  Plan -Darbepoetin injection 53mcg weekly  -will  transfuse with another unit today in anticipation of renal biopsy tomorrow   # Secondary Hyperthyroidism with hyperphosphatemia and hypocalcemia: phos of 6.5 and calcium of 6.3 on 02/02/2014. PTH of 437.5. Phos goal 3.5-5.5, Calcium goal 8.4-9.5, and PTH goal 150-300. Studies have shown increased  mortality when PTH is elevated at >300. VDRA on board to correct PTH as elevated PTH in patients with CKD has shown to have greater mortality.  -Calcium carbonate 1000mg  qAC  -Repeat PTH for new levels after current treatment.  -Doxercalciferol injection 57mcg    Dispo: Disposition is deferred pending biopsy results, venous mapping and arranging for outpatient HD.   The patient does not have a current PCP (No PCP Per Patient), therefore will not be requiring OPC follow-up after discharge.   The patient does not have transportation limitations that hinder transportation to clinic appointments.  .Services Needed at time of discharge: Y = Yes, Blank = No PT:   OT:   RN:   Equipment:   Other:     LOS: 6 days   Sharrell Ku - Internal Medicine Teaching Service Pager: [phone] 02/04/2014, 8:18 PM

## 2014-02-04 NOTE — Progress Notes (Signed)
   Daily Progress Note  Assessment/Planning: POD #4 s/p RIJ TDC placement, ESRD   Unfortunately, renal function note expected to recover per Nephrology  Vein mapping in this RHD female demonstrates BVT is her first access option.  Will try to aim for tomorrow, but depends on available OR time, subsequently might be Tuesday Risk, benefits, and alternatives to access surgery were discussed.  The patient is aware the risks include but are not limited to: bleeding, infection, steal syndrome, nerve damage, ischemic monomelic neuropathy, failure to mature, need for additional procedures, death and stroke.   The patient agrees to proceed forward with the procedure.  Subjective  - 4 Days Post-Op  No events  Objective Filed Vitals:   02/03/14 1630 02/03/14 1643 02/03/14 2034 02/04/14 0612  BP: 142/100 141/99 160/99 154/104  Pulse: 91 97 62 65  Temp:  97.7 F (36.5 C) 98.3 F (36.8 C) 98.3 F (36.8 C)  TempSrc:  Oral Oral Oral  Resp:  18 18 18   Height:   5\' 4"  (1.626 m)   Weight:  127 lb 6.8 oz (57.8 kg) 129 lb 3 oz (58.6 kg)   SpO2:  96% 94% 98%    Intake/Output Summary (Last 24 hours) at 02/04/14 0851 Last data filed at 02/04/14 0612  Gross per 24 hour  Intake    840 ml  Output   3001 ml  Net  -2161 ml   VASC  L brachial pulse strong, no visible veins  Laboratory CBC    Component Value Date/Time   WBC 15.6* 02/04/2014 0615   HGB 9.8* 02/04/2014 0615   HCT 29.6* 02/04/2014 0615   PLT 232 02/04/2014 0615    BMET    Component Value Date/Time   NA 138 02/04/2014 0615   K 3.8 02/04/2014 0615   CL 102 02/04/2014 0615   CO2 23 02/04/2014 0615   GLUCOSE 77 02/04/2014 0615   BUN 45* 02/04/2014 0615   CREATININE 4.80* 02/04/2014 0615   CREATININE 1.38* 01/18/2013 1700   CALCIUM 7.0* 02/04/2014 0615   CALCIUM 6.7* 01/31/2014 1309   GFRNONAA 11* 02/04/2014 0615   GFRAA 13* 02/04/2014 0615   Radiology: Dg Chest Port 1 View  02/02/2014   CLINICAL DATA:  Thoracentesis.  EXAM: PORTABLE CHEST - 1 VIEW   COMPARISON:  US BIOPSY dated 02/01/2014; DG CHEST PORT 1VSAME DAY dated 01/31/2014; DG CHEST 2 VIEW dated 01/29/2014  FINDINGS: Decreased left pleural effusion, still moderate. There is no pneumothorax. Right IJ dialysis catheter is present. There is no right pleural effusion. The cardiopericardial silhouette appears similar in size and configuration compared to prior.  IMPRESSION: Left thoracentesis with persistent moderate left pleural effusion and collapse of the left lower lobe. No pneumothorax   Electronically Signed   By: Dereck Ligas M.D.   On: 02/02/2014 11:05   EKG Orders placed during the hospital encounter of 01/29/14  . EKG 12-LEAD  . EKG 12-LEAD     Adele Barthel, MD [facility] Office: [phone] Pager: [phone]  02/04/2014, 8:51 AM

## 2014-02-05 ENCOUNTER — Other Ambulatory Visit: Payer: Self-pay | Admitting: *Deleted

## 2014-02-05 ENCOUNTER — Inpatient Hospital Stay (HOSPITAL_COMMUNITY): Payer: Medicaid Other | Admitting: Critical Care Medicine

## 2014-02-05 ENCOUNTER — Encounter (HOSPITAL_COMMUNITY): Payer: Medicaid Other | Admitting: Critical Care Medicine

## 2014-02-05 ENCOUNTER — Encounter (HOSPITAL_COMMUNITY): Payer: Self-pay | Admitting: Critical Care Medicine

## 2014-02-05 ENCOUNTER — Encounter (HOSPITAL_COMMUNITY)
Admission: EM | Disposition: A | Discharge status: Home / Self Care | Payer: Self-pay | Source: Home / Self Care | Attending: Infectious Disease

## 2014-02-05 ENCOUNTER — Inpatient Hospital Stay (HOSPITAL_COMMUNITY): Payer: Medicaid Other

## 2014-02-05 DIAGNOSIS — N189 Chronic kidney disease, unspecified: Secondary | ICD-10-CM

## 2014-02-05 DIAGNOSIS — Z4931 Encounter for adequacy testing for hemodialysis: Secondary | ICD-10-CM

## 2014-02-05 DIAGNOSIS — D72829 Elevated white blood cell count, unspecified: Secondary | ICD-10-CM

## 2014-02-05 DIAGNOSIS — N186 End stage renal disease: Secondary | ICD-10-CM

## 2014-02-05 HISTORY — PX: BASCILIC VEIN TRANSPOSITION: SHX5742

## 2014-02-05 LAB — BASIC METABOLIC PANEL WITH GFR
BUN: 63 mg/dL — ABNORMAL HIGH (ref 6–23)
CO2: 22 meq/L (ref 19–32)
Calcium: 7.4 mg/dL — ABNORMAL LOW (ref 8.4–10.5)
Chloride: 99 meq/L (ref 96–112)
Creatinine, Ser: 6.08 mg/dL — ABNORMAL HIGH (ref 0.50–1.10)
GFR calc Af Amer: 10 mL/min — ABNORMAL LOW
GFR calc non Af Amer: 8 mL/min — ABNORMAL LOW
Glucose, Bld: 82 mg/dL (ref 70–99)
Potassium: 3.6 meq/L — ABNORMAL LOW (ref 3.7–5.3)
Sodium: 136 meq/L — ABNORMAL LOW (ref 137–147)

## 2014-02-05 LAB — SURGICAL PCR SCREEN
MRSA, PCR: NEGATIVE
Staphylococcus aureus: NEGATIVE

## 2014-02-05 LAB — PATHOLOGIST SMEAR REVIEW: Path Review: REACTIVE

## 2014-02-05 SURGERY — TRANSPOSITION, VEIN, BASILIC
Anesthesia: Choice | Laterality: Left

## 2014-02-05 MED ORDER — 0.9 % SODIUM CHLORIDE (POUR BTL) OPTIME
TOPICAL | Status: DC | PRN
Start: 2014-02-05 — End: 2014-02-05
  Administered 2014-02-05: 1000 mL

## 2014-02-05 MED ORDER — PROPOFOL INFUSION 10 MG/ML OPTIME
INTRAVENOUS | Status: DC | PRN
  Administered 2014-02-05: 75 ug/kg/min via INTRAVENOUS

## 2014-02-05 MED ORDER — PROPOFOL 10 MG/ML IV BOLUS
INTRAVENOUS | Status: AC
Start: 2014-02-05 — End: 2014-02-05
  Filled 2014-02-05: qty 20

## 2014-02-05 MED ORDER — CEFAZOLIN SODIUM-DEXTROSE 2-3 GM-% IV SOLR
INTRAVENOUS | Status: DC | PRN
  Administered 2014-02-05: 2 g via INTRAVENOUS

## 2014-02-05 MED ORDER — PHENYLEPHRINE 40 MCG/ML (10ML) SYRINGE FOR IV PUSH (FOR BLOOD PRESSURE SUPPORT)
PREFILLED_SYRINGE | INTRAVENOUS | Status: AC
  Filled 2014-02-05: qty 10

## 2014-02-05 MED ORDER — LIDOCAINE-EPINEPHRINE 0.5 %-1:200000 IJ SOLN
INTRAMUSCULAR | Status: DC | PRN
  Administered 2014-02-05: 50 mL via INTRADERMAL

## 2014-02-05 MED ORDER — SODIUM CHLORIDE 0.9 % IV SOLN
INTRAVENOUS | Status: DC
  Administered 2014-02-05: 13:00:00 via INTRAVENOUS
  Administered 2014-02-05: 10 mL/h via INTRAVENOUS
  Administered 2014-02-05: 12:00:00 via INTRAVENOUS

## 2014-02-05 MED ORDER — MIDAZOLAM HCL 2 MG/2ML IJ SOLN
INTRAMUSCULAR | Status: AC
  Filled 2014-02-05: qty 2

## 2014-02-05 MED ORDER — PHENYLEPHRINE HCL 10 MG/ML IJ SOLN
INTRAMUSCULAR | Status: DC | PRN
  Administered 2014-02-05 (×2): 80 ug via INTRAVENOUS
  Administered 2014-02-05: 40 ug via INTRAVENOUS
  Administered 2014-02-05 (×4): 80 ug via INTRAVENOUS

## 2014-02-05 MED ORDER — CEFAZOLIN SODIUM-DEXTROSE 2-3 GM-% IV SOLR
INTRAVENOUS | Status: AC
  Filled 2014-02-05: qty 50

## 2014-02-05 MED ORDER — HEPARIN SODIUM (PORCINE) 1000 UNIT/ML IJ SOLN
1000.0000 [IU] | Freq: Once | INTRAMUSCULAR | Status: AC
  Administered 2014-02-05: 2000 [IU] via INTRAVENOUS

## 2014-02-05 MED ORDER — MIDAZOLAM HCL 5 MG/5ML IJ SOLN
INTRAMUSCULAR | Status: DC | PRN
  Administered 2014-02-05: 2 mg via INTRAVENOUS

## 2014-02-05 MED ORDER — PROPOFOL 10 MG/ML IV BOLUS
INTRAVENOUS | Status: DC | PRN
  Administered 2014-02-05: 50 mg via INTRAVENOUS

## 2014-02-05 MED ORDER — ONDANSETRON HCL 4 MG/2ML IJ SOLN
INTRAMUSCULAR | Status: DC | PRN
  Administered 2014-02-05: 4 mg via INTRAVENOUS

## 2014-02-05 MED ORDER — OXYCODONE-ACETAMINOPHEN 5-325 MG PO TABS
1.0000 | ORAL_TABLET | ORAL | Status: DC | PRN
  Administered 2014-02-05 – 2014-02-08 (×13): 1 via ORAL
  Filled 2014-02-05 (×15): qty 1

## 2014-02-05 MED ORDER — SODIUM CHLORIDE 0.9 % IR SOLN
Status: DC | PRN
  Administered 2014-02-05: 13:00:00

## 2014-02-05 MED ORDER — ONDANSETRON HCL 4 MG/2ML IJ SOLN
INTRAMUSCULAR | Status: AC
  Filled 2014-02-05: qty 2

## 2014-02-05 MED ORDER — LIDOCAINE-EPINEPHRINE 0.5 %-1:200000 IJ SOLN
INTRAMUSCULAR | Status: AC
  Filled 2014-02-05: qty 1

## 2014-02-05 MED ORDER — FENTANYL CITRATE 0.05 MG/ML IJ SOLN
INTRAMUSCULAR | Status: AC
  Filled 2014-02-05: qty 5

## 2014-02-05 MED ORDER — FENTANYL CITRATE 0.05 MG/ML IJ SOLN
INTRAMUSCULAR | Status: DC | PRN
  Administered 2014-02-05 (×4): 25 ug via INTRAVENOUS
  Administered 2014-02-05: 50 ug via INTRAVENOUS
  Administered 2014-02-05 (×2): 25 ug via INTRAVENOUS
  Administered 2014-02-05: 50 ug via INTRAVENOUS

## 2014-02-05 SURGICAL SUPPLY — 35 items
APL SKNCLS STERI-STRIP NONHPOA (GAUZE/BANDAGES/DRESSINGS) ×1
BENZOIN TINCTURE PRP APPL 2/3 (GAUZE/BANDAGES/DRESSINGS) ×3 IMPLANT
CANISTER SUCTION 2500CC (MISCELLANEOUS) ×3 IMPLANT
CLIP LIGATING EXTRA MED SLVR (CLIP) ×3 IMPLANT
CLIP LIGATING EXTRA SM BLUE (MISCELLANEOUS) ×3 IMPLANT
CLOSURE STERI-STRIP 1/4X4 (GAUZE/BANDAGES/DRESSINGS) ×2 IMPLANT
CLOSURE WOUND 1/2 X4 (GAUZE/BANDAGES/DRESSINGS) ×1
COVER PROBE W GEL 5X96 (DRAPES) ×3 IMPLANT
COVER SURGICAL LIGHT HANDLE (MISCELLANEOUS) ×3 IMPLANT
DECANTER SPIKE VIAL GLASS SM (MISCELLANEOUS) ×3 IMPLANT
ELECT REM PT RETURN 9FT ADLT (ELECTROSURGICAL) ×3
ELECTRODE REM PT RTRN 9FT ADLT (ELECTROSURGICAL) ×1 IMPLANT
GEL ULTRASOUND 20GR AQUASONIC (MISCELLANEOUS) ×2 IMPLANT
GLOVE SS BIOGEL STRL SZ 7.5 (GLOVE) ×1 IMPLANT
GLOVE SUPERSENSE BIOGEL SZ 7.5 (GLOVE) ×4
GOWN STRL REUS W/ TWL LRG LVL3 (GOWN DISPOSABLE) ×3 IMPLANT
GOWN STRL REUS W/TWL LRG LVL3 (GOWN DISPOSABLE) ×9
KIT BASIN OR (CUSTOM PROCEDURE TRAY) ×3 IMPLANT
KIT ROOM TURNOVER OR (KITS) ×3 IMPLANT
NS IRRIG 1000ML POUR BTL (IV SOLUTION) ×3 IMPLANT
PACK CV ACCESS (CUSTOM PROCEDURE TRAY) ×3 IMPLANT
PAD ARMBOARD 7.5X6 YLW CONV (MISCELLANEOUS) ×6 IMPLANT
SPONGE GAUZE 4X4 12PLY (GAUZE/BANDAGES/DRESSINGS) ×3 IMPLANT
STRIP CLOSURE SKIN 1/2X4 (GAUZE/BANDAGES/DRESSINGS) ×2 IMPLANT
SUT PROLENE 6 0 CC (SUTURE) ×3 IMPLANT
SUT SILK 2 0 SH (SUTURE) ×3 IMPLANT
SUT SILK 3 0 (SUTURE) ×3
SUT SILK 3-0 18XBRD TIE 12 (SUTURE) IMPLANT
SUT VIC AB 3-0 SH 27 (SUTURE) ×6
SUT VIC AB 3-0 SH 27X BRD (SUTURE) ×1 IMPLANT
TAPE CLOTH SURG 4X10 WHT LF (GAUZE/BANDAGES/DRESSINGS) ×2 IMPLANT
TOWEL OR 17X24 6PK STRL BLUE (TOWEL DISPOSABLE) ×3 IMPLANT
TOWEL OR 17X26 10 PK STRL BLUE (TOWEL DISPOSABLE) ×3 IMPLANT
UNDERPAD 30X30 INCONTINENT (UNDERPADS AND DIAPERS) ×3 IMPLANT
WATER STERILE IRR 1000ML POUR (IV SOLUTION) ×3 IMPLANT

## 2014-02-05 NOTE — Consult Note (Signed)
Name: [name] MRN: [id number] DOB: [birth year]    ADMISSION DATE:  01/29/2014 CONSULTATION DATE:  2/5  REFERRING MD :  [name]  PRIMARY SERVICE:  IMTS CHIEF COMPLAINT:  Pleural effusion   BRIEF PATIENT DESCRIPTION:  61 yof with known lupus and history of lupus nephritis, who fell out of care x 77yrs having previously been followed by [name] with rheumatology. Admitted w/ acute renal failure, with AGAP metabolic acidosis, hypocalcemia and hyperphosphatemia uremia. Pulmonary was asked to see re: left pleural effusion that has not improved w/ volume removal via HD.      SIGNIFICANT EVENTS / STUDIES:  2/2: admitted w/ dyspnea, acute renal failure and metabolic acidosis. Started on pulse steroids. Renal consulted.  2/4 first round of HD 2/5 renal bx 2/6 thora left   LINES / TUBES: 2/4 right IJ vasc cath >>> 2/9 Left upper arm basilic vein transposition>>>  CULTURES:  ANTIBIOTICS:   HISTORY OF PRESENT ILLNESS:    40 yof with known lupus and history of lupus nephritis, who fell out of care having previously been followed by [name] with rheumatology. She is admitted 2/2 with cc; 1 mo h/o progressive SOB, w/ increased LE edema. Dx eval uncovered: acute renal failure, with AGAP metabolic acidosis, hypocalcemia and hyperphosphatemia uremia. bilateral pleural effusions and new rash on her legs. Diagnostic/therapeutic interventions to date have included: initiation of high dose steroids, nephrology consult and initiation of HD on 2/4, and IR consult for renal bx in effort to establish acute vs chronic SLE nephritis. Pulmonary was asked to see re: left pleural effusion that has not improved w/ volume removal via HD.       SUBJECTIVE:  No distress  VITAL SIGNS: Temp:  [97.1 F (36.2 C)-98.8 F (37.1 C)] 98.7 F (37.1 C) (02/09 0549) Pulse Rate:  [72-92] 84 (02/09 0549) Resp:  [16-19] 16 (02/09 0549) BP: (133-180)/(90-104) 140/102 mmHg (02/09 0637) SpO2:  [96 %-100 %] 100  % (02/09 0549) Weight:  [132 lb (59.875 kg)] 132 lb (59.875 kg) (02/08 2132)  PHYSICAL EXAMINATION: General:  No acute distress.  Neuro:  Awake, no acute distress.  HEENT:  [place], no JVD  Cardiovascular:  rrr Lungs:  Decreased BS, VR on left  Abdomen:  Soft, non-tender  Musculoskeletal:  Intact  Skin:  Intact    Recent Labs Lab 02/03/14 0430 02/04/14 0615 02/05/14 0637  NA 137 138 136*  K 4.0 3.8 3.6*  CL 102 102 99  CO2 21 23 22   BUN 54* 45* 63*  CREATININE 6.50* 4.80* 6.08*  GLUCOSE 93 77 82    Recent Labs Lab 02/02/14 1100 02/03/14 0430 02/04/14 0615  HGB 10.6* 9.2* 9.8*  HCT 30.3* 26.8* 29.6*  WBC 14.4* 14.0* 15.6*  PLT 223 206 232   No results found.  ASSESSMENT / PLAN:  Left pleural effusion. In setting of SLE nephrititis w/ acute on chronic renal failure. Dyspnea has improved some w/ HD. Not sure at this point if this represents pleural involvement of her SLE or if it's due to her renal failure. Could be a mix of both.  Plan Has residual effusion or atx on pcxr after thora, may need repeat pending clinical status and success by renal for neg balance in future Will repeat pcxr now If we chose to repeat thora, would [name] chest for ATX prior Findings cw transudative process, does not meet exudative from ldh or prot = favor overload, renal failure Fluid: Glucose 150 LD 68 Protein 2.2 (serum  7.5) WBC 22 pcxr again in am   Acute on chronic renal renal failure:  in setting of hx/o SLE III/V nephritis. Nephrology following. Concerned that she is now ESRD.  Plan -renal bx pending -further w/u per renal   HTN,  Microcytic Anemia, per IM and renal  Will follow up in am with additional pcxr  [name] ACNP [name] PCCM Pager [phone] till 3 pm If no answer page [phone] 02/05/2014, 9:54 AM  I have fully examined this patient and agree with above findings.    And edited infull  [name]. [name] Mould, MD, [place] Pgr: [facility]

## 2014-02-05 NOTE — Progress Notes (Signed)
  Date: 02/05/2014  Patient name: [name]  Medical record number: [id number]  Date of birth: 12/16/1981   This patient has been seen and the plan of care was discussed with the house staff. Please see their note for complete details. I concur with their findings with the following additions/corrections:  Patient doing very well and on exam and improved symptoms post thoracocentesis. She still does have some diminised BS at the left base. She is going for VVS surgery today  Truman Hayward, MD 02/05/2014, 1:35 PM

## 2014-02-05 NOTE — Progress Notes (Signed)
Subjective: Doing well without complaints, would like ice chips, but understands plans for vascular access.  Eating well. Normal BMs. Breathing well.      Objective: Vital signs in last 24 hours: Filed Vitals:   02/04/14 1735 02/04/14 2132 02/05/14 0549 02/05/14 0637  BP: 136/92 145/104 180/101 140/102  Pulse: 72 92 84   Temp: 97.1 F (36.2 C) 98.8 F (37.1 C) 98.7 F (37.1 C)   TempSrc: Oral Oral Tympanic   Resp: 18 16 16    Height:      Weight:  132 lb (59.875 kg)    SpO2: 98% 96% 100%    Weight change: -15 oz (-0.425 kg)  Intake/Output Summary (Last 24 hours) at 02/05/14 0929 Last data filed at 02/04/14 1902  Gross per 24 hour  Intake   1420 ml  Output      1 ml  Net   1419 ml   General: resting in bed, no acute distress HEENT: PERRL, EOMI, no scleral icterus Back: L renal bx site clean/dry, thoracentesis site clean/dry Cardiac: RRR, no rubs, murmurs or gallops Pulm: clear to auscultation bilaterally but decreased breath sounds over LLL, no increased work of breathing, chest catheter in place  Abd: soft, nontender, nondistended, BS normoactive Ext: warm and well perfused, no pedal edema Neuro: alert and oriented X3, cranial nerves II-XII grossly intact  Lab Results: Basic Metabolic Panel:  Recent Labs Lab 01/29/14 1818  01/30/14 0020  01/31/14 0536  02/01/14 0448  02/04/14 0615 02/05/14 0637  NA 137  --   --   < > 135*  --  138  < > 138 136*  K 5.2  --   --   < > 5.3  --  4.5  < > 3.8 3.6*  CL 107  --   --   < > 105  --  104  < > 102 99  CO2 11*  --   --   < > 10*  --  16*  < > 23 22  GLUCOSE 82  --   --   < > 125*  --  120*  < > 77 82  BUN 77*  --   --   < > 85*  --  70*  < > 45* 63*  CREATININE 13.27*  --   --   < > 13.22*  --  9.35*  < > 4.80* 6.08*  CALCIUM 6.0*  --   --   < > 6.7*  < > 6.5*  < > 7.0* 7.4*  MG  --   --  1.5  --   --   --   --   --   --   --   PHOS  --   < > 10.5*  --  10.2*  --  6.5*  --   --   --   < > = values in this interval  not displayed. Liver Function Tests:  Recent Labs Lab 01/30/14 0616  02/01/14 0448 02/02/14 1100 02/02/14 1333  AST 11  --   --  18  --   ALT 6  --   --  7  --   ALKPHOS 43  --   --  43  --   BILITOT <0.2*  --   --  <0.2*  --   PROT 6.9  --   --  7.5 7.2  ALBUMIN 1.0*  < > 1.2* 1.2*  --   < > = values in this interval not  displayed. No results found for this basename: LIPASE, AMYLASE,  in the last 168 hours No results found for this basename: AMMONIA,  in the last 168 hours CBC:  Recent Labs Lab 01/30/14 0616  02/03/14 0430 02/04/14 0615  WBC 5.9  < > 14.0* 15.6*  NEUTROABS 5.2  --   --   --   HGB 7.0*  < > 9.2* 9.8*  HCT 20.3*  < > 26.8* 29.6*  MCV 73.8*  < > 78.8 80.7  PLT 237  < > 206 232  < > = values in this interval not displayed.  Medications: I have reviewed the patient's current medications. Scheduled Meds: . amLODipine  10 mg Oral Daily  . calcium carbonate  800 mg of elemental calcium Oral TID AC  . darbepoetin (ARANESP) injection - DIALYSIS  60 mcg Intravenous Q Wed-HD  . doxercalciferol  2 mcg Intravenous Q T,Th,Sa-HD  . predniSONE  60 mg Oral Q breakfast   Continuous Infusions:  PRN Meds:.acetaminophen, cloNIDine, hydrALAZINE   Assessment/Plan: Ms. Kunzler is a 33yo woman with history of lupus nephritis untreated for 5 years who presented with ARF and L transudative pleural effusion in the setting of lupus nephritis flare. Admitted 01/29/14  #Acute on Chronic Renal failure d/t lupus nephritis: Stable, was complicated by transudative pleural effusion s/p thoracentesis, doing well with HD, s/p renal bx on 02/01/14 -Awaiting permeanent vascular access, appreciate VVS recs -Plan for HD #3 today (K mildly low today), appreciate Nephro recs -Awaiting renal bx results from [facility] -Continue prednisone 60mg  for now -Renal diet, strict I/Os, daily weights (net -1.4L since admit, weight 129 on 2/7 --> 132 yesterday, weight today pending)  #Secondary hyperPTH with  hyperPhos + HypoCa: Complication of CKD. Phos goal 3.5-5.5, Ca goal 8.4-9.5, PTH goal 150-300 Ca improved 7-->7.4 (corrects to 9.6) -Cont CaCarbonate 1000mg  AC & Hectorol --> if Ca remains elevated, may need to decrease Ca supplementation  #HTN: Improved, but diastolic remains elevated -Continue amlodipine 10mg  daily -Continue clonidine 0.2 q6h prn, but patient hasn't received this -Also has hydral 20mg  IV q4h prn, hasn't received this --> consider d/c both prn orders if not needed tomorrow  #Microcytic Anemia: Stable s/p 2u PRBCs on 01/31/14 -ESA weekly  #VTE ppx: no heparin in setting of recent renal bx, SCDs, but patient is also ambulatory\  --> Remainder per [facility] Konrad Saha) Note    Dispo: Disposition is deferred at this time, awaiting improvement of current medical problems.  Anticipated discharge in approximately 2-3 day(s).   The patient does not have a current PCP (No Pcp Per Patient) and may need an [place] follow-up appointment after discharge.  The patient does not have transportation limitations that hinder transportation to clinic appointments.  .Services Needed at time of discharge: Y = Yes, Blank = No PT:   OT:   RN:   Equipment:   Other:     LOS: 7 days   Othella Boyer, MD 02/05/2014, 9:29 AM

## 2014-02-05 NOTE — Progress Notes (Signed)
Admit: 01/29/2014 LOS: 7  78F w/ hx/o IIIa/V SLE Nephritis (Bx 2006), CKD (BL SCr ~2 --> 13) not adherent to treatment x4-5y admitted with AKI, SCr 13, UP/C 12.  Pt also with L sided pleural effusion, dyspnea.    Subjective:  HD#2 Sat. 3L UF S/p Renal Bx 02/01/14- no word yet Has had thoracentesis VVS following , pt now s/p BVT today On 60 pred Breathing well Good appetite  02/08 0701 - 02/09 0700 In: 1660 [P.O.:1660] Out: 2 [Urine:2]  Filed Weights   02/03/14 1643 02/03/14 2034 02/04/14 2132  Weight: 57.8 kg (127 lb 6.8 oz) 58.6 kg (129 lb 3 oz) 59.875 kg (132 lb)    Current meds: reviewed, s/p 3d pulse solumedrol 1g (2/2-2/4) Current Labs: reviewed    Physical Exam:  Blood pressure 129/93, pulse 64, temperature 98.4 F (36.9 C), temperature source Oral, resp. rate 14, height 5\' 4"  (1.626 m), weight 59.875 kg (132 lb), last menstrual period 12/27/2013, SpO2 95.00%. NAD, awake, alert, appropriate- minimal pain after surgery RRR.  No rub. Nl s1s2 BS diminshed in L lower field.  CTA otherwise Trace to 1+ pitting edema Rash on legs unchanged Nonfocal, MAE NCAT- new left upper arm BVT- bruit  Assessment/Plan 1. AoCKD in setting of hx/o SLE III/V nephritis: HD #3 tonight or tomorrow based on the schedule.  No heparin s/p biopsy.  I believe the pt very likely is new ESRD.  Would like to review path findings first.   2. SLE Nephritis: Renal Bx 2/5.  S/p pulse medrol 1g (2/2-2/4). Now on 60 mg pred daily.  Normal C3, C4, and bland UA suggest that this is not an aggressive, acute GN.  Might just be scarring, IF/TA.  Hope for some prelim path results 02/05/14- nothing yet 3. HTN: Amlodipine to 10mg /clonidine and hydralazine PRN .  Cont to inc UF goals at HD.  Stable now 4. Nephrotic Proteiniuria: as above. 5. Microcytic Anemia: Improved s/p 2u PRBC.  On ESA 6. Metabolic Acidosis: Improved with HD.  Stop PO NAHCO3 7. L pleural effusion: s/p thoracentsis 8. 2HPTH and Hyperphosphatemia: on  Tums 1qAC.  PTH 438 on VDRA- hectorol 2   Thorn Demas A  02/05/2014, 4:14 PM   Recent Labs Lab 01/30/14 0020  01/31/14 0536  02/01/14 0448  02/03/14 0430 02/04/14 0615 02/05/14 [id number]  NA  --   < > 135*  --  138  < > 137 138 136*  K  --   < > 5.3  --  4.5  < > 4.0 3.8 3.6*  CL  --   < > 105  --  104  < > 102 102 99  CO2  --   < > 10*  --  16*  < > 21 23 22   GLUCOSE  --   < > 125*  --  120*  < > 93 77 82  BUN  --   < > 85*  --  70*  < > 54* 45* 63*  CREATININE  --   < > 13.22*  --  9.35*  < > 6.50* 4.80* 6.08*  CALCIUM  --   < > 6.7*  < > 6.5*  < > 6.6* 7.0* 7.4*  PHOS 10.5*  --  10.2*  --  6.5*  --   --   --   --   < > = values in this interval not displayed.  Recent Labs Lab 01/30/14 0616  02/02/14 1100 02/03/14 0430 02/04/14 0615  WBC 5.9  < >  14.4* 14.0* 15.6*  NEUTROABS 5.2  --   --   --   --   HGB 7.0*  < > 10.6* 9.2* 9.8*  HCT 20.3*  < > 30.3* 26.8* 29.6*  MCV 73.8*  < > 78.3 78.8 80.7  PLT 237  < > 223 206 232  < > = values in this interval not displayed.

## 2014-02-05 NOTE — Anesthesia Procedure Notes (Signed)
Procedure Name: LMA Insertion Date/Time: 02/05/2014 12:47 PM Performed by: Carola Frost Pre-anesthesia Checklist: Patient identified, Timeout performed, Emergency Drugs available, Suction available and Patient being monitored Patient Re-evaluated:Patient Re-evaluated prior to inductionOxygen Delivery Method: Circle system utilized Preoxygenation: Pre-oxygenation with 100% oxygen Intubation Type: IV induction LMA: LMA inserted LMA Size: 4.0 Number of attempts: 1 Placement Confirmation: positive ETCO2 and breath sounds checked- equal and bilateral Tube secured with: Tape Dental Injury: Teeth and Oropharynx as per pre-operative assessment

## 2014-02-05 NOTE — Progress Notes (Addendum)
Medical Student Daily Progress Note  Subjective: No events overnight. Patient has had 3 dialysis treatments, and is waiting for permanent dialysis access placement today.   No fevers or chills, pedal edema, abdominal pain or headache. L chest pressure has decreased, and she has not had SOB. She is able to urinate about 132m twice a day. Some leg cramps, but no new numbness, or tingling.  Objective: Vital signs in last 24 hours: Filed Vitals:   02/04/14 2132 02/05/14 0549 02/05/14 0637 02/05/14 1000  BP: 145/104 180/101 140/102 148/104  Pulse: 92 84  80  Temp: 98.8 F (37.1 C) 98.7 F (37.1 C)  98.1 F (36.7 C)  TempSrc: Oral Tympanic  Oral  Resp: _0 Height:      Weight: 59.875 kg (132 lb)     SpO2: 96% 100%  99%   Weight change: -0.425 kg (-15 oz)  Intake/Output Summary (Last 24 hours) at 02/05/14 1056 Last data filed at 02/04/14 1902  Gross per 24 hour  Intake    960 ml  Output      1 ml  Net    959 ml  intake: >15063mper patient report Ouput: [id number] patient report Net: [id number] Exam: General appearance: alert and no distress  Throat: Hypopigmented macule on right tongue  Back: symmetric, no curvature. ROM normal. No CVA tenderness., left renal biopsy site clean and non-draining, left chest mid scapula thoracentesis site clean and non-draining  Lungs: clear to auscultation bilaterally and no egophony or diminished breath on the left as compared to previous exams  Heart: regular rate and rhythm, S1, S2 normal, no murmur, click, rub or gallop  Abdomen: Soft, non tender, slightly distended, multiple stretch marks  Extremities: extremities normal, atraumatic, no cyanosis or edema  Skin: livedo reticularis darkening on bilateral calves  Lab Results: Lab Results  Component Value Date   CREATININE 6.08* 02/05/2014   BUN 63* 02/05/2014   NA 136* 02/05/2014   K 3.6* 02/05/2014   CL 99 02/05/2014   CO2 22 02/05/2014   CBC    Component Value Date/Time   WBC  15.6* 02/04/2014 0615   RBC 3.67* 02/04/2014 0615   RBC 2.68* 01/31/2014 0536   HGB 9.8* 02/04/2014 0615   HCT 29.6* 02/04/2014 0615   PLT 232 02/04/2014 0615   MCV 80.7 02/04/2014 0615   MCH 26.7 02/04/2014 0615   MCHC 33.1 02/04/2014 0615   RDW 15.1 02/04/2014 0615   LYMPHSABS 0.6* 01/30/2014 0616   MONOABS 0.1 01/30/2014 0616   EOSABS 0.1 01/30/2014 0616   BASOSABS 0.0 01/30/2014 0616      Micro Results: Recent Results (from the past 240 hour(s))  SURGICAL PCR SCREEN     Status: None   Collection Time    02/05/14  4:51 AM      Result Value Range Status   MRSA, PCR NEGATIVE  NEGATIVE Final   Staphylococcus aureus NEGATIVE  NEGATIVE Final   Comment:            The Xpert SA Assay (FDA     approved for NASAL specimens     in patients over 21 y90rs of age),     is one component of     a comprehensive surveillance     program.  Test performance has     been validated by [employer] patients greater     than or equal to 1 ye21r old.  It is not intended     to diagnose infection nor to     guide or monitor treatment.   Studies/Results: No results found. Medications: I have reviewed the patient's current medications. Scheduled Meds: . amLODipine  10 mg Oral Daily  . calcium carbonate  800 mg of elemental calcium Oral TID AC  . darbepoetin (ARANESP) injection - DIALYSIS  60 mcg Intravenous Q Wed-HD  . doxercalciferol  2 mcg Intravenous Q T,Th,Sa-HD  . predniSONE  60 mg Oral Q breakfast   Continuous Infusions:  PRN Meds:.acetaminophen, cloNIDine, hydrALAZINE Assessment/Plan: Principal Problem:   ESRD on dialysis Active Problems:   Acute systemic lupus erythematosus   Lupus nephritis   Acute renal failure   Microcytic anemia   Hypocalcemia   Acute uremia   Pleural effusion, left   Metabolic acidosis, increased anion gap (IAG)   Hypoalbuminemia   Nephrotic syndrome   LOS: 7 days   [name] is a 33yo woman with history of lupus nephritis untreated for 5 years who presents  with ARF and L transudative pleural effusion in the setting of lupus nephritis flare. Patient is receiving dialysis and will have a permanent access placed while waiting for renal biopsy results.   1. Acute Renal Failure with FEna of 12.2% in the setting of untreated lupus nephritis for 5 years: Intrarenal cause of acute renal failure. Baseline Cr is at 1-2. BUN is 63 post dialysis; down from admission. HIV and HCV negative. Renal ultrasound showed normal size kidneys with increased echogenicity. Patient has tunneled catheter placed for temporary RRT (3rd dialysis on 02/04/2014).   -S/p 3 rounds of dialysis; last dialysis 2/8 -Receiving permanent access today by vascular surgery -Waiting for renal biopsy results today for treatment course -Appreciate Nephrology Recs  -Renal diet  -Strict I/Os  -Daily renal labs, and CBC   2. High blood pressure without organ damage: Patient was dialyzed on 01/31/2014, and had blood pressure >190 after. Patient has experienced minor headache from sudden decrease of BP to <140/90. Need to consider ending clonidine and hydralazine post surgical procedure to evaluate BP for home BP regimen. -Clonidine 0.52m q6H PRN, given first while BP>165/90.  -Hydralazine 5101mq4H PRN if BP>170  -Amlodipine 1042mD per nephrology recs  3. SLE and lupus nephritis flare: patient has a history of lupus nephritis and has not been treated for 5 years. She has symptoms of flare with elevated ESR of 139, low C3 (59), though normal C4 level of 32, renal symptoms as described above, skin changes with livedo reticularis on leg starting in December.  -Prednisone 13m57m  -Hold mycophenolate pending status of renal ultrasound/possible biopsy (urine pregnancy is negative should this medication is to be used)   4. Pleural Effusion: CXR shows significant left pleural effusion, and diagnostic thoracentesis shows transudative fluid with fluid protein/ serum protein at <0.5, fluid LDH/serum LDH<0.6, and  fluid LDH is not >upper 2/3 of serum LDH, indicative of a nephrotic syndrome. However, serum-effusion albumin difference is 0.6, indicating an exudative process, but the serum-effusion albumin difference of <1.2 is only specific in CHF patients. Thus, we will follow Light's criteria. Cell count in fluid does not reveal empyema with WBC of 28, and lymphs of 21. LFTs are normal (AST 11, ALT 6). No pneumothorax on CXR after diagnostic and therapeutic thoracentesis.  -Monitor for re-expansion pulmonary edema   5. Microcytic Anemia: Hgb stable at 9.8 after 2 pRBC transfusion on 2/3 with pre-transfusion hgb of 6.8.  -Darbepoetin injection 13mc32mekly  -  Transfuse if Hgb<7   6. Acidosis: CO2 of 21 on 02/03/2014  -stopped sodium bicarbonate as CO2 is corrected for loss. Currently, need to treat underlying renal failure and uremia to correct CO2 fully.   7. Secondary Hyperthyroidism with hyperphosphatemia and hypocalcemia: phos of 6.5 and calcium of 7.4 corrected to 9.6 on 02/05/2014. PTH of 437.5. Phos goal 3.5-5.5, Calcium goal 8.4-9.5, and PTH goal 150-300. Studies have shown increased mortality when PTH is elevated at >300. VDRA on board to correct PTH as elevated PTH in patients with CKD has shown to have greater mortality.  -Calcium carbonate 1042m qAC  -Repeat PTH for new levels after current treatment.  -Doxercalciferol injection 246m  -Check one more phosphorus and calcium and stop calcium carbonate if levels are corrected.  8. Elevated WBC to 14.4  Patient has isolated asymptomatic WBC elevation in the setting of using high glucocorticoids and meets no other SIRS criteria. Patient does have a pleural effusion, but that has been removed, and the fluid is found to be transudative. Elevated WBC most likely due to glucocorticoid use. MRSA PCR is negative for carrier status. -Monitor for SIRS criteria and symptoms   Prophylaxis:  -DVT: intermittent pneumatic compression.  This is a [employer] information officerNote.  The care of the patient was discussed with Dr. VaTommy Medalnd the assessment and plan formulated with their assistance.  Please see their attached note for official documentation of the daily encounter.  JiKonrad Saha/08/2014, 10:56 AM

## 2014-02-05 NOTE — Preoperative (Signed)
Beta Blockers   Reason not to administer Beta Blockers:Not Applicable 

## 2014-02-05 NOTE — Interval H&P Note (Signed)
History and Physical Interval Note:  02/05/2014 11:33 AM  [name]  has presented today for surgery, with the diagnosis of ESRD  The various methods of treatment have been discussed with the patient and family. After consideration of risks, benefits and other options for treatment, the patient has consented to  Procedure(s): [name] (Left) as a surgical intervention .  The patient's history has been reviewed, patient examined, no change in status, stable for surgery.  I have reviewed the patient's chart and labs.  Questions were answered to the patient's satisfaction.     Laquanna Veazey

## 2014-02-05 NOTE — Anesthesia Preprocedure Evaluation (Signed)
Anesthesia Evaluation  Patient identified by MRN, date of birth, ID band Patient awake    Reviewed: Allergy & Precautions, H&P , NPO status , Patient's Chart, lab work & pertinent test results  Airway       Dental   Pulmonary Current Smoker,          Cardiovascular     Neuro/Psych    GI/Hepatic (+)     substance abuse  cocaine use and marijuana use,   Endo/Other    Renal/GU ESRF, Dialysis and CRFRenal disease     Musculoskeletal   Abdominal   Peds  Hematology   Anesthesia Other Findings Lupus systemic/ renal  Reproductive/Obstetrics                           Anesthesia Physical Anesthesia Plan  ASA: III  Anesthesia Plan: MAC   Post-op Pain Management:    Induction: Intravenous  Airway Management Planned: Mask  Additional Equipment:   Intra-op Plan:   Post-operative Plan:   Informed Consent: I have reviewed the patients History and Physical, chart, labs and discussed the procedure including the risks, benefits and alternatives for the proposed anesthesia with the patient or authorized representative who has indicated his/her understanding and acceptance.     Plan Discussed with:   Anesthesia Plan Comments:         Anesthesia Quick Evaluation

## 2014-02-05 NOTE — H&P (View-Only) (Signed)
   Daily Progress Note  Assessment/Planning: POD #4 s/p RIJ TDC placement, ESRD   Unfortunately, renal function note expected to recover per Nephrology  Vein mapping in this RHD female demonstrates BVT is her first access option.  Will try to aim for tomorrow, but depends on available OR time, subsequently might be Tuesday Risk, benefits, and alternatives to access surgery were discussed.  The patient is aware the risks include but are not limited to: bleeding, infection, steal syndrome, nerve damage, ischemic monomelic neuropathy, failure to mature, need for additional procedures, death and stroke.   The patient agrees to proceed forward with the procedure.  Subjective  - 4 Days Post-Op  No events  Objective Filed Vitals:   02/03/14 1630 02/03/14 1643 02/03/14 2034 02/04/14 0612  BP: 142/100 141/99 160/99 154/104  Pulse: 91 97 62 65  Temp:  97.7 F (36.5 C) 98.3 F (36.8 C) 98.3 F (36.8 C)  TempSrc:  Oral Oral Oral  Resp:  18 18 18   Height:   5\' 4"  (1.626 m)   Weight:  127 lb 6.8 oz (57.8 kg) 129 lb 3 oz (58.6 kg)   SpO2:  96% 94% 98%    Intake/Output Summary (Last 24 hours) at 02/04/14 0851 Last data filed at 02/04/14 0612  Gross per 24 hour  Intake    840 ml  Output   3001 ml  Net  -2161 ml   VASC  L brachial pulse strong, no visible veins  Laboratory CBC    Component Value Date/Time   WBC 15.6* 02/04/2014 0615   HGB 9.8* 02/04/2014 0615   HCT 29.6* 02/04/2014 0615   PLT 232 02/04/2014 0615    BMET    Component Value Date/Time   NA 138 02/04/2014 0615   K 3.8 02/04/2014 0615   CL 102 02/04/2014 0615   CO2 23 02/04/2014 0615   GLUCOSE 77 02/04/2014 0615   BUN 45* 02/04/2014 0615   CREATININE 4.80* 02/04/2014 0615   CREATININE 1.38* 01/18/2013 1700   CALCIUM 7.0* 02/04/2014 0615   CALCIUM 6.7* 01/31/2014 1309   GFRNONAA 11* 02/04/2014 0615   GFRAA 13* 02/04/2014 0615   Radiology: Dg Chest Port 1 View  02/02/2014   CLINICAL DATA:  Thoracentesis.  EXAM: PORTABLE CHEST - 1 VIEW   COMPARISON:  US BIOPSY dated 02/01/2014; DG CHEST PORT 1VSAME DAY dated 01/31/2014; DG CHEST 2 VIEW dated 01/29/2014  FINDINGS: Decreased left pleural effusion, still moderate. There is no pneumothorax. Right IJ dialysis catheter is present. There is no right pleural effusion. The cardiopericardial silhouette appears similar in size and configuration compared to prior.  IMPRESSION: Left thoracentesis with persistent moderate left pleural effusion and collapse of the left lower lobe. No pneumothorax   Electronically Signed   By: Dereck Ligas M.D.   On: 02/02/2014 11:05   EKG Orders placed during the hospital encounter of 01/29/14  . EKG 12-LEAD  . EKG 12-LEAD     Adele Barthel, MD [facility] Office: [phone] Pager: [phone]  02/04/2014, 8:51 AM

## 2014-02-05 NOTE — Op Note (Signed)
    OPERATIVE REPORT  DATE OF SURGERY: 02/05/2014  PATIENT: [name], 33 y.o. female MRN: [id number]  DOB: 13-Oct-1981  PRE-OPERATIVE DIAGNOSIS: End-stage renal disease  POST-OPERATIVE DIAGNOSIS:  Same  PROCEDURE: Left upper arm basilic vein transposition  SURGEON:  Curt Jews, M.D.  PHYSICIAN ASSISTANT: Collins,Trihn  ANESTHESIA:  Gen.  EBL: Minimal ml  Total I/O In: 600 [I.V.:600] Out: 25 [Blood:25]  BLOOD ADMINISTERED: None  DRAINS: None  SPECIMEN: None  COUNTS CORRECT:  YES  PLAN OF CARE: PACU   PATIENT DISPOSITION:  PACU - hemodynamically stable  PROCEDURE DETAILS: The patient was taken up and placed supine position where the area the left arm left exit were prepped and draped in usual sterile fashion. SonoSite ultrasound was used to visualize the basilic vein. This was of a moderate caliber throughout its course. The cephalic vein was very small. The basilic vein was marked. The patient normal radial and brachial pulse. Incision was made near the antecubital space over the level of the basilic vein. The vein had 2 small branches this level but the second together just above the elbow good caliber. The vein was exposed with incision at the antecubital space mid forearm to the axilla. Tributary branches of the basilic vein were ligated with 301 4-0 silk ties and divided. The vein was left intact at the axillary vein. The vein were ligated and divided distally. The vein was brought through the tunnel and exited at the exit well. This had been marked to prevent twisting. The vein was gently dilated with heparinized and was found to be of good caliber. The brachial artery was exposed above the antecubital space through the same antecubital incision. The artery was of good caliber. A tunnel was created from the level of the brachial artery over the upper arm to the axilla and the basilic vein was brought through the tunnel taking care to not twist it. The artery was  occluded proximally and distally with Serafin clamps was opened 11 blade and sent longitudinally with Potts scissors. A small arteriotomy was created. The vein was cut to appropriate length and spatulated and sewn end-to-side to the artery with a running 6-0 Prolene suture. Clamps removed and good flow was noted. Wound irrigated with saline. Hemostasis daily cautery. Wounds were closed with 3-0 Vicryl the subcutaneous and subcuticular tissue. Benzoin stricture for applied   Curt Jews, M.D. 02/05/2014 2:58 PM

## 2014-02-05 NOTE — Transfer of Care (Signed)
Immediate Anesthesia Transfer of Care Note  Patient: [name]  Procedure(s) Performed: Procedure(s): Neola (Left)  Patient Location: PACU  Anesthesia Type:General  Level of Consciousness: awake, alert  and oriented  Airway & Oxygen Therapy: Patient Spontanous Breathing and Patient connected to nasal cannula oxygen  Post-op Assessment: Report given to PACU RN, Post -op Vital signs reviewed and stable and Patient moving all extremities X 4  Post vital signs: Reviewed and stable  Complications: No apparent anesthesia complications

## 2014-02-05 NOTE — Progress Notes (Signed)
HD red port capped off. Flushed with 10cc NS with GBR. Flushed with Heparin 2.39ml (1000u/ml) per priming volume in the red port.  Cap cleaned, dead end cap placed on the red port, both clamps taped, red "high dose heparin" sticker placed on the cathter.  Catalina Pizza

## 2014-02-05 NOTE — Progress Notes (Signed)
  I have seen and examined the patient, and reviewed the daily progress note by Konrad Saha, MS III and discussed the care of the patient with them. Please see my progress note from 02/05/2014 for further details regarding assessment and plan.    Signed:  Othella Boyer, MD 02/05/2014, 4:03 PM

## 2014-02-06 ENCOUNTER — Telehealth: Payer: Self-pay | Admitting: Vascular Surgery

## 2014-02-06 ENCOUNTER — Inpatient Hospital Stay (HOSPITAL_COMMUNITY): Payer: Medicaid Other

## 2014-02-06 DIAGNOSIS — H6691 Otitis media, unspecified, right ear: Secondary | ICD-10-CM

## 2014-02-06 LAB — CBC
HCT: 28.7 % — ABNORMAL LOW (ref 36.0–46.0)
Hemoglobin: 9.2 g/dL — ABNORMAL LOW (ref 12.0–15.0)
MCH: 26.6 pg (ref 26.0–34.0)
MCHC: 32.1 g/dL (ref 30.0–36.0)
MCV: 82.9 fL (ref 78.0–100.0)
Platelets: 192 10*3/uL (ref 150–400)
RBC: 3.46 MIL/uL — ABNORMAL LOW (ref 3.87–5.11)
RDW: 15.4 % (ref 11.5–15.5)
WBC: 16.3 10*3/uL — AB (ref 4.0–10.5)

## 2014-02-06 LAB — BASIC METABOLIC PANEL
BUN: 77 mg/dL — ABNORMAL HIGH (ref 6–23)
CALCIUM: 7 mg/dL — AB (ref 8.4–10.5)
CO2: 21 meq/L (ref 19–32)
Chloride: 101 mEq/L (ref 96–112)
Creatinine, Ser: 6.69 mg/dL — ABNORMAL HIGH (ref 0.50–1.10)
GFR calc Af Amer: 9 mL/min — ABNORMAL LOW (ref >90)
GFR, EST NON AFRICAN AMERICAN: 8 mL/min — AB (ref >90)
Glucose, Bld: 82 mg/dL (ref 70–99)
Potassium: 3.6 mEq/L — ABNORMAL LOW (ref 3.7–5.3)
SODIUM: 138 meq/L (ref 137–147)

## 2014-02-06 MED ORDER — OXYCODONE-ACETAMINOPHEN 5-325 MG PO TABS
ORAL_TABLET | ORAL | Status: AC
  Filled 2014-02-06: qty 1

## 2014-02-06 MED ORDER — DOXERCALCIFEROL 4 MCG/2ML IV SOLN
INTRAVENOUS | Status: AC
  Administered 2014-02-06: 2 ug
  Filled 2014-02-06: qty 2

## 2014-02-06 NOTE — Progress Notes (Signed)
Admit: 01/29/2014 LOS: 8  44F w/ hx/o IIIa/V SLE Nephritis (Bx 2006), CKD (BL SCr ~2 --> 13) not adherent to treatment x4-5y admitted with AKI vs CKD, SCr 13, UP/C 12.  Pt also with L sided pleural effusion, dyspnea.    Subjective:  HD#3 today S/p Renal Bx 02/01/14- no word yet Has had thoracentesis- pulm just following for need of repeat thora VVS following , pt now s/p BVT today On 60 pred Breathing well Good appetite  02/09 0701 - 02/10 0700 In: 951.4 [P.O.:300; I.V.:651.4] Out: 25 [Blood:25]  Filed Weights   02/04/14 2132 02/05/14 2127 02/06/14 0843  Weight: 59.875 kg (132 lb) 64.139 kg (141 lb 6.4 oz) 59.7 kg (131 lb 9.8 oz)    Current meds: reviewed, s/p 3d pulse solumedrol 1g (2/2-2/4) Current Labs: reviewed    Physical Exam:  Blood pressure 136/94, pulse 102, temperature 98.3 F (36.8 C), temperature source Oral, resp. rate 18, height 5\' 4"  (1.626 m), weight 59.7 kg (131 lb 9.8 oz), last menstrual period 12/27/2013, SpO2 96.00%. NAD, awake, alert, appropriate- minimal pain after surgery RRR.  No rub. Nl s1s2 BS diminshed in L lower field.  CTA otherwise Trace to 1+ pitting edema Rash on legs unchanged Nonfocal, MAE NCAT- new left upper arm BVT placed 2/9 - bruit  Assessment/Plan 1. AoCKD in setting of hx/o SLE III/V nephritis: HD #3 today   No heparin.  I believe the pt very likely is new ESRD.  Would like to review path findings first- should have preliminary today- but will also start CLIP process   2. SLE Nephritis: Renal Bx 2/5.  S/p pulse medrol 1g (2/2-2/4). Now on 60 mg pred daily.  Normal C3, C4, and UA suggest that this is not an aggressive, acute GN.  Might just be scarring, IF/TA.  Hope for some prelim path results today 3. HTN: Amlodipine to 10mg /clonidine and hydralazine PRN .  Cont to inc UF goals at HD.  Stable now 4. Nephrotic Proteiniuria: as above. 5. Microcytic Anemia: Improved s/p 2u PRBC.  On ESA 6. Metabolic Acidosis: Improved with HD.  Stop PO  NAHCO3 7. L pleural effusion: s/p thoracentesis- is till present 8. 2HPTH and Hyperphosphatemia: on Tums 1qAC.  PTH 438 on VDRA- hectorol 2   Gweneth Fredlund A  02/06/2014, 9:59 AM   Recent Labs Lab 01/31/14 0536  02/01/14 0448  02/04/14 0615 02/05/14 0637 02/06/14 0440  NA 135*  --  138  < > 138 136* 138  K 5.3  --  4.5  < > 3.8 3.6* 3.6*  CL 105  --  104  < > 102 99 101  CO2 10*  --  16*  < > 23 22 21   GLUCOSE 125*  --  120*  < > 77 82 82  BUN 85*  --  70*  < > 45* 63* 77*  CREATININE 13.22*  --  9.35*  < > 4.80* 6.08* 6.69*  CALCIUM 6.7*  < > 6.5*  < > 7.0* 7.4* 7.0*  PHOS 10.2*  --  6.5*  --   --   --   --   < > = values in this interval not displayed.  Recent Labs Lab 02/03/14 0430 02/04/14 0615 02/06/14 0842  WBC 14.0* 15.6* 16.3*  HGB 9.2* 9.8* 9.2*  HCT 26.8* 29.6* 28.7*  MCV 78.8 80.7 82.9  PLT 206 232 192

## 2014-02-06 NOTE — Progress Notes (Addendum)
Name: [name] MRN: [id number] DOB: 02-14-1981    ADMISSION DATE:  01/29/2014 CONSULTATION DATE:  2/5  REFERRING MD :  Lucianne Lei dam  PRIMARY SERVICE:  IMTS CHIEF COMPLAINT:  Pleural effusion   BRIEF PATIENT DESCRIPTION:  5 yof with known lupus and history of lupus nephritis, who fell out of care x 11yrs having previously been followed by Dr. Charlestine Night with rheumatology. Admitted w/ acute renal failure, with AGAP metabolic acidosis, hypocalcemia and hyperphosphatemia uremia. Pulmonary was asked to see re: left pleural effusion that has not improved w/ volume removal via HD.     SIGNIFICANT EVENTS / STUDIES:  2/2: admitted w/ dyspnea, acute renal failure and metabolic acidosis. Started on pulse steroids. Renal consulted.  2/4 first round of HD 2/5 renal bx 2/6 thora left >> glucose 150, LDH 68, protein 2.2, WBC 28 (71% M), cytology negative 2/9 av graft left arm  LINES / TUBES: 2/4 right IJ vasc cath >>> 2/9 Left upper arm basilic vein transposition>>>  SUBJECTIVE:  No distress   VITAL SIGNS: Temp:  [97.8 F (36.6 C)-98.5 F (36.9 C)] 98.3 F (36.8 C) (02/10 0843) Pulse Rate:  [64-101] 85 (02/10 0455) Resp:  [7-20] 20 (02/10 0455) BP: (118-148)/(77-104) 135/93 mmHg (02/10 0455) SpO2:  [95 %-100 %] 100 % (02/10 0455) Weight:  [131 lb 9.8 oz (59.7 kg)-141 lb 6.4 oz (64.139 kg)] 131 lb 9.8 oz (59.7 kg) (02/10 0843) Room air  PHYSICAL EXAMINATION: General:  No acute distress. Currently in dialysis  Neuro:  Awake, no acute distress.  HEENT:  [place], no JVD  Cardiovascular:  rrr Lungs:  Decreased BS, VR on left  Abdomen:  Soft, non-tender  Musculoskeletal:  Intact  Skin:  Intact   CBC Recent Labs     02/04/14  0615  02/06/14  0842  WBC  15.6*  16.3*  HGB  9.8*  9.2*  HCT  29.6*  28.7*  PLT  232  192   BMET Recent Labs     02/04/14  0615  02/05/14  0637  02/06/14  0440  NA  138  136*  138  K  3.8  3.6*  3.6*  CL  102  99  101  CO2  23  22  21   BUN  45*  63*   77*  CREATININE  4.80*  6.08*  6.69*  GLUCOSE  77  82  82    Electrolytes Recent Labs     02/04/14  0615  02/05/14  0637  02/06/14  0440  CALCIUM  7.0*  7.4*  7.0*    Imaging Dg Chest Port 1 View  02/06/2014   CLINICAL DATA:  Infiltrate  EXAM: PORTABLE CHEST - 1 VIEW  COMPARISON:  February 02, 2014  FINDINGS: There is a persistent left effusion with patchy consolidation in the left lower lobe, slightly increased. Right lung is clear. Heart is enlarged with normal pulmonary vascularity, stable. Dual-lumen catheter tip is at the cavoatrial junction. No pneumothorax. No adenopathy. Postoperative change is noted in the left upper arm region medially.  IMPRESSION: Persistent cardiomegaly. Persistent left effusion with slight increase and left lower lobe infiltrate. Right lung clear.   Electronically Signed   By: Lowella Grip M.D.   On: 02/06/2014 07:59   Dg Chest Port 1 View  02/05/2014   CLINICAL DATA:  Evaluate pleural effusion  EXAM: PORTABLE CHEST - 1 VIEW  COMPARISON:  DG CHEST 1V PORT dated 02/02/2014; DG CHEST PORT 1VSAME DAY dated 01/31/2014; DG CHEST  2 VIEW dated 01/29/2014  FINDINGS: Grossly unchanged cardiac silhouette and mediastinal contours with partial obscuration of the left heart border secondary to minimally decreased though persistent left-sided pleural effusion and associated left mid and lower lung heterogeneous / consolidative opacities. Stable positioning of support apparatus. No pneumothorax. No new focal airspace opacities. No evidence of edema. Unchanged bones.  IMPRESSION: Suspected minimal reduction in persistent small sized left-sided effusion.   Electronically Signed   By: Sandi Mariscal M.D.   On: 02/05/2014 17:18    ASSESSMENT / PLAN:  A: Transudate Lt pleural effusion. P: -continue negative fluid balance as tolerated -consider repeat thoracentesis if she develops respiratory symptoms from effusion -f/u CXR intermittently  A: Lupus nephritis with ESRD. P: -per  renal   2/10 PCCM will sign off.  [name] ACNP [name] PCCM Pager [phone] till 3 pm If no answer page [phone] 02/06/2014, 9:03 AM

## 2014-02-06 NOTE — Anesthesia Postprocedure Evaluation (Signed)
  Anesthesia Post-op Note  Patient: [name]  Procedure(s) Performed: Procedure(s): BASCILIC VEIN TRANSPOSITION (Left)  Patient Location: PACU  Anesthesia Type:General  Level of Consciousness: awake, oriented, sedated and patient cooperative  Airway and Oxygen Therapy: Patient Spontanous Breathing  Post-op Pain: mild  Post-op Assessment: Post-op Vital signs reviewed, Patient's Cardiovascular Status Stable, Respiratory Function Stable, Patent Airway, No signs of Nausea or vomiting and Pain level controlled  Post-op Vital Signs: stable  Complications: No apparent anesthesia complications

## 2014-02-06 NOTE — Progress Notes (Signed)
Medical Student Daily Progress Note  Subjective: No events overnight. No headache, no chest pain, no SOB.   Objective: Vital signs in last 24 hours: Filed Vitals:   02/05/14 1549 02/05/14 1800 02/05/14 2127 02/06/14 0455  BP: 129/93 118/77 145/92 135/93  Pulse: 64 98 101 85  Temp: 98.4 F (36.9 C) 97.8 F (36.6 C) 98.1 F (36.7 C) 98.5 F (36.9 C)  TempSrc: Oral Oral Oral Oral  Resp: $Remo'14 16 20 20  'luMAX$ Height:   '5\' 4"'$  (1.626 m)   Weight:   64.139 kg (141 lb 6.4 oz)   SpO2: 95% 100% 100% 100%   Weight change: 4.264 kg (9 lb 6.4 oz)  Intake/Output Summary (Last 24 hours) at 02/06/14 0839 Last data filed at 02/05/14 1800  Gross per 24 hour  Intake 951.39 ml  Output     25 ml  Net 926.39 ml   Physical Exam: BP 132/90  Pulse 104  Temp(Src) 98.3 F (36.8 C) (Oral)  Resp 19  Ht $R'5\' 4"'Zt$  (1.626 m)  Wt 59.7 kg (131 lb 9.8 oz)  BMI 22.58 kg/m2  SpO2 96%  LMP 12/27/2013 General appearance: alert, cooperative and no distress Neck: no JVD and supple, symmetrical, trachea midline Back: wound site for L renal biopsy and L thoracocentesis clean and non draining Lungs: clear to auscultation bilaterally Chest wall: Tunneled catheter placed below right clavicular bone, dry, no drainage Heart: regular rate and rhythm, S1, S2 normal, no murmur, click, rub or gallop Abdomen: soft, non-tender; bowel sounds normal; no masses,  no organomegaly Extremities: No edema, AV fistula wound on left upper arm dry and non draining Skin: darkening 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 Lab Results: Lab Results  Component Value Date   CREATININE 6.69* 02/06/2014   BUN 77* 02/06/2014   NA 138 02/06/2014   K 3.6* 02/06/2014   CL 101 02/06/2014   CO2 21 02/06/2014   Lab Results  Component Value Date   CALCIUM 7.0* 02/06/2014   PHOS 6.5* 02/01/2014   CBC    Component Value Date/Time   WBC 16.3* 02/06/2014 0842   RBC 3.46* 02/06/2014 0842   RBC 2.68* 01/31/2014 0536   HGB 9.2* 02/06/2014 0842   HCT 28.7*  02/06/2014 0842   PLT 192 02/06/2014 0842   MCV 82.9 02/06/2014 0842   MCH 26.6 02/06/2014 0842   MCHC 32.1 02/06/2014 0842   RDW 15.4 02/06/2014 0842   LYMPHSABS 0.6* 01/30/2014 0616   MONOABS 0.1 01/30/2014 0616   EOSABS 0.1 01/30/2014 0616   BASOSABS 0.0 01/30/2014 0277     Micro Results: Recent Results (from the past 240 hour(s))  SURGICAL PCR SCREEN     Status: None   Collection Time    02/05/14  4:51 AM      Result Value Range Status   MRSA, PCR NEGATIVE  NEGATIVE Final   Staphylococcus aureus NEGATIVE  NEGATIVE Final   Comment:            The Xpert SA Assay (FDA     approved for NASAL specimens     in patients over 29 years of age),     is one component of     a comprehensive surveillance     program.  Test performance has     been validated by [employer] for patients greater     than or equal to 36 year old.     It is not intended     to diagnose infection  nor to     guide or monitor treatment.   Studies/Results: Dg Chest Port 1 View  02/06/2014   CLINICAL DATA:  Infiltrate  EXAM: PORTABLE CHEST - 1 VIEW  COMPARISON:  February 02, 2014  FINDINGS: There is a persistent left effusion with patchy consolidation in the left lower lobe, slightly increased. Right lung is clear. Heart is enlarged with normal pulmonary vascularity, stable. Dual-lumen catheter tip is at the cavoatrial junction. No pneumothorax. No adenopathy. Postoperative change is noted in the left upper arm region medially.  IMPRESSION: Persistent cardiomegaly. Persistent left effusion with slight increase and left lower lobe infiltrate. Right lung clear.   Electronically Signed   By: Lowella Grip M.D.   On: 02/06/2014 07:59   Dg Chest Port 1 View  02/05/2014   CLINICAL DATA:  Evaluate pleural effusion  EXAM: PORTABLE CHEST - 1 VIEW  COMPARISON:  DG CHEST 1V PORT dated 02/02/2014; DG CHEST PORT 1VSAME DAY dated 01/31/2014; DG CHEST 2 VIEW dated 01/29/2014  FINDINGS: Grossly unchanged cardiac silhouette and mediastinal  contours with partial obscuration of the left heart border secondary to minimally decreased though persistent left-sided pleural effusion and associated left mid and lower lung heterogeneous / consolidative opacities. Stable positioning of support apparatus. No pneumothorax. No new focal airspace opacities. No evidence of edema. Unchanged bones.  IMPRESSION: Suspected minimal reduction in persistent small sized left-sided effusion.   Electronically Signed   By: Sandi Mariscal M.D.   On: 02/05/2014 17:18   Medications: I have reviewed the patient's current medications. Scheduled Meds: . amLODipine  10 mg Oral Daily  . calcium carbonate  800 mg of elemental calcium Oral TID AC  . darbepoetin (ARANESP) injection - DIALYSIS  60 mcg Intravenous Q Wed-HD  . doxercalciferol  2 mcg Intravenous Q T,Th,Sa-HD  . predniSONE  60 mg Oral Q breakfast   Continuous Infusions: . sodium chloride 10 mL/hr (02/05/14 1119)   PRN Meds:.acetaminophen, cloNIDine, hydrALAZINE, oxyCODONE-acetaminophen Assessment/Plan: Principal Problem:   ESRD on dialysis Active Problems:   Acute systemic lupus erythematosus   Lupus nephritis   Acute renal failure   Microcytic anemia   Hypocalcemia   Acute uremia   Pleural effusion, left   Metabolic acidosis, increased anion gap (IAG)   Hypoalbuminemia   Nephrotic syndrome   LOS: 8 days   Ms. Gadsby is a 33yo woman with history of lupus nephritis untreated for 5 years who presents with ARF and L transudative pleural effusion in the setting of lupus nephritis flare. Patient is receiving dialysis and has permanent access placed, and she is waiting for renal biopsy results while starting CLIP process to enroll in a dialysis center.  1. Acute Renal Failure with FEna of 12.2% in the setting of untreated lupus nephritis for 5 years: Intrarenal cause of acute renal failure. HIV and HCV negative. Renal ultrasound showed normal size kidneys with increased echogenicity. L renal biopsy  taken. Waiting on biopsy results. Patient has tunneled catheter placed for temporary RRT (3rd dialysis on 02/04/2014), and now has permanent access placed on L upper arm (02/05/2014). -S/p 4 rounds of dialysis; last dialysis 2/10  -Waiting for renal biopsy results today for treatment course  -Start CLIP process for placement in dialysis center -Appreciate Nephrology Recs  -Renal diet  -Strict I/Os  -Daily renal labs, and CBC   2. High blood pressure without organ damage: Patient was dialyzed on 01/31/2014, and had blood pressure >190 after. Patient has experienced minor headache from sudden decrease of BP to <  140/90.  -Stop Clonidine and hydralazine PRN orders in preparation to go home. Patient has not needed Clonidine or hydralazine for BP since 2/7 -Amlodipine 10mg  qD per nephrology recs   3. SLE and lupus nephritis flare: patient has a history of lupus nephritis and has not been treated for 5 years. She has symptoms of flare with elevated ESR of 139, low C3 (59), though normal C4 level of 32, renal symptoms as described above, skin changes with livedo reticularis on leg starting in December.  -Prednisone 60mg  qD  -Hold mycophenolate pending status of renal ultrasound/possible biopsy (urine pregnancy is negative should this medication is to be used)   4. Pleural Effusion: CXR shows significant left pleural effusion, and diagnostic thoracentesis shows transudative fluid with fluid protein/ serum protein at <0.5, fluid LDH/serum LDH<0.6, and fluid LDH is not >upper 2/3 of serum LDH, indicative of a nephrotic syndrome. However, serum-effusion albumin difference is 0.6, indicating an exudative process, but the serum-effusion albumin difference of <1.2 is only specific in CHF patients. Thus, we will follow Light's criteria. Cell count in fluid does not reveal empyema with WBC of 28, and lymphs of 21. LFTs are normal (AST 11, ALT 6). Repeat CXR on 2/10 showed persistent left effusion with patchy consolidation  in the left lower lobe, slightly increased from 2/9.  -Monitor for re-expansion pulmonary edema and symptoms of active pleural effusion. -Incentive spirometry  5. Microcytic Anemia: Hgb stable at 9.2 after 2 pRBC transfusion on 2/3 with pre-transfusion hgb of 6.8.  -Darbepoetin injection 2mcg weekly  -Transfuse if Hgb<7   6. Acidosis: CO2 of 21 on 02/03/2014  -stopped sodium bicarbonate as CO2 is corrected for loss. Currently, need to treat underlying renal failure and uremia to correct CO2 fully.   7. Secondary Hyperthyroidism with hyperphosphatemia and hypocalcemia: phos of 6.5 and calcium of 7.0 corrected to 9.2 on 02/06/2014. PTH of 437.5. Phos goal 3.5-5.5, Calcium goal 8.4-9.5, and PTH goal 150-300. Studies have shown increased mortality when PTH is elevated at >300. VDRA on board to correct PTH as elevated PTH in patients with CKD has shown to have greater mortality.  -Calcium carbonate 1000mg  qAC  -Repeat PTH at outpatient in 3 to 6 months to monitor -Doxercalciferol injection 103mcg   8. Elevated WBC to 14.4  Patient has isolated asymptomatic WBC elevation in the setting of using high glucocorticoids and meets no other SIRS criteria. Patient does have a pleural effusion, but that has been removed, and the fluid is found to be transudative. Elevated WBC most likely due to glucocorticoid use. MRSA PCR is negative for carrier status.  -Monitor for SIRS criteria and symptoms   Prophylaxis:  -DVT: intermittent pneumonic compression. Patient has nephrotic syndrome with severely low albumin level (1.2 on 2/6). Thus, patient should have prophylaxis UFH at 48 hours after vascular surgery (2/11) for permanent dialysis access.    This is a Careers information officer Note.  The care of the patient was discussed with Dr. Tommy Medal and the assessment and plan formulated with their assistance.  Please see their attached note for official documentation of the daily encounter.  Konrad Saha 02/06/2014, 8:39 AM

## 2014-02-06 NOTE — Progress Notes (Signed)
I have seen the patient and reviewed the daily progress note by Elvin So MS IV. I personally examined the patient and discussed the care of the patient with the student as documented in her note.   Signed:  Jessee Avers, MD PGY-2 Internal Medicine Teaching Service Pager: [phone]

## 2014-02-06 NOTE — Telephone Encounter (Addendum)
Message copied by Gena Fray on Tue Feb 06, 2014  1:15 PM ------      Message from: Peter Minium K      Created: Mon Feb 05, 2014  4:28 PM      Regarding: schedule       Duplicate I think            ----- Message -----         From: Ulyses Amor, PA-C         Sent: 02/05/2014   2:42 PM           To: [facility] Charge Pool            S/p left BVT 02/05/14            F/u with Dr. Donnetta Hutching in 4 weeks             ------  02/06/14: lm for pt re appt, dpm

## 2014-02-06 NOTE — Progress Notes (Signed)
  Date: 02/06/2014  Patient name: [name]  Medical record number: [id number]  Date of birth: 08/19/1981   This patient has been seen and the plan of care was discussed with the house staff. Please see their note for complete details. I concur with their findings with the following additions/corrections:  Greatly appreciate Nephrology, CCm and [facility] help. AWaiting pathology final results but appears to be need for permanent HD.  Will need case management to arrange for this.   Truman Hayward, MD 02/06/2014, 12:26 PM

## 2014-02-06 NOTE — Procedures (Signed)
Patient was seen on dialysis and the procedure was supervised.  BFR 400  Via PC BP is  124/84.   Patient appears to be tolerating treatment well  [name] A 02/06/2014

## 2014-02-06 NOTE — Progress Notes (Addendum)
[facility]  Subjective  - Doing well no sign of steal.     Objective 135/93 85 98.5 F (36.9 C) (Oral) 20 100%  Intake/Output Summary (Last 24 hours) at 02/06/14 0744 Last data filed at 02/05/14 1800  Gross per 24 hour  Intake 951.39 ml  Output     25 ml  Net 926.39 ml    Left arm with palpable thrill Palpable radial pulses Sensation intact and grip 5/5 on the left equal bil.  Assessment/Planning: POD #1  Left upper arm basilic vein transposition  F/U in 4-6 weeks with Dr. Tonie Griffith, [facility] 02/06/2014 7:44 AM --  Laboratory Lab Results:  Recent Labs  02/04/14 0615  WBC 15.6*  HGB 9.8*  HCT 29.6*  PLT 232   BMET  Recent Labs  02/05/14 0637 02/06/14 0440  NA 136* 138  K 3.6* 3.6*  CL 99 101  CO2 22 21  GLUCOSE 82 82  BUN 63* 77*  CREATININE 6.08* 6.69*  CALCIUM 7.4* 7.0*    COAG Lab Results  Component Value Date   INR 1.14 01/30/2014   No results found for this basename: PTT

## 2014-02-07 MED ORDER — CLONIDINE HCL 0.2 MG PO TABS
0.2000 mg | ORAL_TABLET | Freq: Two times a day (BID) | ORAL | Status: DC
  Administered 2014-02-07 – 2014-02-08 (×2): 0.2 mg via ORAL
  Filled 2014-02-07 (×3): qty 1
  Filled 2014-02-07: qty 2

## 2014-02-07 MED ORDER — NICOTINE 7 MG/24HR TD PT24
7.0000 mg | MEDICATED_PATCH | Freq: Every day | TRANSDERMAL | Status: DC
  Administered 2014-02-07 – 2014-02-08 (×2): 7 mg via TRANSDERMAL
  Filled 2014-02-07 (×2): qty 1

## 2014-02-07 MED ORDER — PREDNISONE 20 MG PO TABS
40.0000 mg | ORAL_TABLET | Freq: Every day | ORAL | Status: DC
  Administered 2014-02-08: 40 mg via ORAL
  Filled 2014-02-07 (×2): qty 2

## 2014-02-07 MED ORDER — CYCLOBENZAPRINE HCL 10 MG PO TABS
5.0000 mg | ORAL_TABLET | Freq: Three times a day (TID) | ORAL | Status: DC | PRN
  Administered 2014-02-07: 5 mg via ORAL

## 2014-02-07 MED ORDER — HEPARIN SODIUM (PORCINE) 5000 UNIT/ML IJ SOLN
5000.0000 [IU] | Freq: Three times a day (TID) | INTRAMUSCULAR | Status: DC
  Administered 2014-02-07 – 2014-02-08 (×2): 5000 [IU] via SUBCUTANEOUS
  Filled 2014-02-07 (×6): qty 1

## 2014-02-07 NOTE — Progress Notes (Signed)
Medical Student Daily Progress Note  Subjective: No events overnight. No worsening of SOB or L chest pain. No headache. Patient is walking around.  Objective: Vital signs in last 24 hours: Filed Vitals:   02/06/14 1700 02/06/14 2242 02/07/14 0504 02/07/14 0921  BP: 128/86 133/92 160/113 154/98  Pulse: 109 80 97 98  Temp: 99.3 F (37.4 C) 98.2 F (36.8 C) 98.8 F (37.1 C) 98.6 F (37 C)  TempSrc: Oral Oral Oral Oral  Resp: _0 Height:  5' 4" (1.626 m)    Weight:  58.5 kg (128 lb 15.5 oz)    SpO2: 97% 97% 95% 98%   Weight change: -4.439 kg (-9 lb 12.6 oz)  Intake/Output Summary (Last 24 hours) at 02/07/14 1049 Last data filed at 02/07/14 0900  Gross per 24 hour  Intake    580 ml  Output   1784 ml  Net  -1204 ml   Physical Exam: BP 154/98  Pulse 98  Temp(Src) 98.6 F (37 C) (Oral)  Resp 18  Ht 5' 4" (1.626 m)  Wt 58.5 kg (128 lb 15.5 oz)  BMI 22.13 kg/m2  SpO2 98%  LMP 12/27/2013 General appearance: alert, cooperative and no distress Neck: supple, symmetrical, trachea midline Back: L renal biopsy site and L thoracentesis sites clean and non draining Lungs: diminished breath sounds base - left, dullness to percussion base - left and egophony improving in LLL, but sound is still distant Heart: Tachycardic, regular rhythm, normal S1 and S2, no murmurs, gallops, or rubs Abdomen: soft, non-tender; bowel sounds normal; no masses,  no organomegaly Extremities: No edema, not much changed with pedal edema from admission Skin: 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  Test performance has     been validated by [employer] for patients greater     than or equal to 79 year old.     It is not intended     to diagnose infection nor to     guide or monitor treatment.   Studies/Results: Dg Chest Port 1 View  02/06/2014   CLINICAL DATA:  Infiltrate  EXAM: PORTABLE CHEST - 1 VIEW  COMPARISON:  February 02, 2014  FINDINGS: There is a persistent left effusion with patchy consolidation in the left lower lobe, slightly increased. Right lung is clear. Heart is enlarged with normal pulmonary vascularity, stable. Dual-lumen catheter tip is at the cavoatrial junction. No pneumothorax. No adenopathy. Postoperative change is noted in the left upper arm region medially.  IMPRESSION: Persistent cardiomegaly. Persistent left effusion with slight increase and left lower lobe infiltrate. Right lung clear.   Electronically Signed   By: Lowella Grip M.D.   On: 02/06/2014 07:59   Dg Chest Port 1 View  02/05/2014   CLINICAL DATA:  Evaluate pleural effusion  EXAM: PORTABLE CHEST - 1 VIEW  COMPARISON:  DG CHEST 1V PORT dated 02/02/2014; DG CHEST PORT 1VSAME DAY dated 01/31/2014; DG CHEST 2 VIEW dated 01/29/2014  FINDINGS: Grossly unchanged cardiac silhouette and mediastinal contours with partial obscuration of the left heart border secondary to minimally decreased though persistent left-sided pleural effusion and associated left mid and lower lung heterogeneous / consolidative opacities. Stable positioning of support apparatus. No pneumothorax. No new focal airspace opacities. No evidence of edema. Unchanged bones.  IMPRESSION: Suspected minimal reduction in persistent small sized left-sided effusion.   Electronically Signed   By: Sandi Mariscal M.D.   On: 02/05/2014 17:18   Medications: I have reviewed the patient's current medications. Scheduled Meds: . amLODipine  10 mg Oral Daily  . calcium carbonate  800 mg  of elemental calcium Oral TID AC  . darbepoetin (ARANESP) injection - DIALYSIS  60 mcg Intravenous Q Wed-HD  . doxercalciferol  2 mcg Intravenous Q T,Th,Sa-HD  . predniSONE  60 mg Oral Q breakfast   Continuous Infusions: . sodium chloride 10 mL/hr (02/05/14 1119)   PRN Meds:.acetaminophen, cloNIDine, hydrALAZINE, oxyCODONE-acetaminophen Assessment/Plan: Principal Problem:   ESRD on dialysis Active Problems:   Acute systemic lupus erythematosus   Lupus nephritis   Acute renal failure   Microcytic anemia   Hypocalcemia   Acute uremia   Pleural effusion, left   Metabolic acidosis, increased anion gap (IAG)   Hypoalbuminemia   Nephrotic syndrome   LOS: 9 days  [name] is a 33yo woman with history of lupus nephritis untreated for 5 years who presents with ARF and L transudative pleural effusion in the setting of lupus nephritis flare. Patient is receiving dialysis and has permanent access placed, and she is waiting for renal biopsy results while starting CLIP process to enroll in a dialysis center.   1. Acute Renal Failure with FEna of 12.2% in the setting of untreated lupus nephritis for 5 years: Intrarenal cause of acute renal failure. HIV and HCV negative. Renal ultrasound showed normal size kidneys with increased echogenicity. L renal biopsy taken. Waiting on biopsy results. Patient has tunneled catheter placed for temporary RRT (3rd dialysis on 02/04/2014), and now has permanent access placed on L upper arm (02/05/2014).  -S/p 4 rounds of dialysis; last dialysis 2/10  -Waiting for renal biopsy results today for treatment course  -Start CLIP process for placement in dialysis center  -Appreciate Nephrology Recs  -Renal diet  -Strict I/Os  -Daily renal labs, and CBC   2. High blood pressure without organ damage: Patient was dialyzed on 01/31/2014, and had blood pressure >190 after. Patient has experienced minor headache from sudden decrease of BP to <140/90. Patient has had one BP  reading in 180 on 2/9 that required Clonidine. Patient's baseline BP ranges from 130's to 150's with most reading ins 130's to 140's. Patient's goal would be <140/90. Since patient is on max dose of amlodipine and she is receiving dialysis, will let dialysis center know of her increased blood pressure during the next treatment.  -Stop Clonidine and hydralazine PRN orders in preparation to go home. Patient has not needed Clonidine or hydralazine for BP since 2/7  -Amlodipine 16m qD per nephrology recs  3. SLE and lupus nephritis flare: patient has a history of lupus nephritis and has not been treated for 5 years. She has symptoms of flare with elevated ESR of 139, low C3 (59), though normal C4 level of 32, renal symptoms as described above, skin  changes with livedo reticularis on leg starting in December.  -Prednisone 20m qD  -Hold mycophenolate pending status of renal ultrasound/possible biopsy (urine pregnancy is negative should this medication is to be used)   4. Pleural Effusion: CXR shows significant left pleural effusion, and diagnostic thoracentesis shows transudative fluid with fluid protein/ serum protein at <0.5, fluid LDH/serum LDH<0.6, and fluid LDH is not >upper 2/3 of serum LDH, indicative of a nephrotic syndrome. However, serum-effusion albumin difference is 0.6, indicating an exudative process, but the serum-effusion albumin difference of <1.2 is only specific in CHF patients. Thus, we will follow Light's criteria. Cell count in fluid does not reveal empyema or lupus pleuritis with WBC of 28, and lymphs of 21. LFTs are normal (AST 11, ALT 6). Repeat CXR on 2/10 showed persistent left effusion with patchy consolidation in the left lower lobe, slightly increased from 2/9.  -Monitor for re-expansion pulmonary edema and symptoms of active pleural effusion.  -Incentive spirometry   5. Microcytic Anemia: Hgb stable at 9.2 on 2/10 after 2 pRBC transfusion on 2/3 with pre-transfusion hgb of 6.8.  No daily CBC given stable hemoglobin -Darbepoetin injection 644m weekly  -Transfuse if Hgb<7   6. Acidosis: CO2 of 21 on 02/03/2014  -stopped sodium bicarbonate as CO2 is corrected for loss. Currently, need to treat underlying renal failure and uremia to correct CO2 fully.   7. Secondary Hyperthyroidism with hyperphosphatemia and hypocalcemia: phos of 6.5 and calcium of 7.0 corrected to 9.2 on 02/06/2014. PTH of 437.5. Phos goal 3.5-5.5, Calcium goal 8.4-9.5, and PTH goal 150-300. Studies have shown increased mortality when PTH is elevated at >300. VDRA on board to correct PTH as elevated PTH in patients with CKD has shown to have greater mortality.  -Calcium carbonate 100078mAC  -Repeat PTH at outpatient in 3 to 6 months to monitor  -Stopped Doxercalciferol injection 2mc41mue to corrected Ca is at goal.   8. Elevated WBC to 14.4  Patient has isolated asymptomatic WBC elevation in the setting of using high glucocorticoids and meets no other SIRS criteria. Patient does have a pleural effusion, but that has been removed, and the fluid is found to be transudative. Elevated WBC most likely due to glucocorticoid use. MRSA PCR is negative for carrier status.  -Monitor for SIRS criteria and symptoms   Prophylaxis:  -DVT: 5,000 units of UFH because patient has nephrotic syndrome with severely low albumin level (1.2 on 2/6) and patient is s/p 48 hours from last procedure. No new procedures planned. We encouraged patient to ambulate.   This is a [employer] information officere.  The care of the patient was discussed with Dr. Van Tommy Medal the assessment and plan formulated with their assistance.  Please see their attached note for official documentation of the daily encounter.  JianKonrad Saha1/2015, 10:49 AM

## 2014-02-07 NOTE — Progress Notes (Signed)
02/07/2014 2:39 PM. Hemodialysis Outpatient Note; this patient has been accepted at the [facility] on a Monday, Wednesday and Friday 2nd shift schedule.The center can begin treatment on Friday February 13,2015. Please have patient arrive for her appointment at 10:45 AM to sign paperwork and consents. Thank you. Gordy Savers

## 2014-02-07 NOTE — Progress Notes (Signed)
Subjective: She feels well today.   Objective: Vital signs in last 24 hours: Filed Vitals:   02/07/14 0504 02/07/14 0921 02/07/14 1400 02/07/14 1724  BP: 160/113 154/98 140/88 158/101  Pulse: 97 98 96 102  Temp: 98.8 F (37.1 C) 98.6 F (37 C) 98.2 F (36.8 C) 98.6 F (37 C)  TempSrc: Oral Oral Oral Oral  Resp: $Remo'18 18 18 18  'ZNXuX$ Height:      Weight:      SpO2: 95% 98% 98% 98%   Weight change: -9 lb 12.6 oz (-4.439 kg)  Intake/Output Summary (Last 24 hours) at 02/07/14 1743 Last data filed at 02/07/14 1700  Gross per 24 hour  Intake   1110 ml  Output      0 ml  Net   1110 ml   General appearance: alert and no distress  Lungs: better aeration on the left side.  Abdomen: Soft, non tender, slightly distended, multiple stretch marks  Extremities: extremities normal, atraumatic, no cyanosis or edema  Skin: livedo reticularis darkening on bilateral calves  Lab Results: Basic Metabolic Panel:  Recent Labs Lab 02/01/14 0448  02/05/14 0637 02/06/14 0440  NA 138  < > 136* 138  K 4.5  < > 3.6* 3.6*  CL 104  < > 99 101  CO2 16*  < > 22 21  GLUCOSE 120*  < > 82 82  BUN 70*  < > 63* 77*  CREATININE 9.35*  < > 6.08* 6.69*  CALCIUM 6.5*  < > 7.4* 7.0*  PHOS 6.5*  --   --   --   < > = values in this interval not displayed. Liver Function Tests:  Recent Labs Lab 02/01/14 0448 02/02/14 1100 02/02/14 1333  AST  --  18  --   ALT  --  7  --   ALKPHOS  --  43  --   BILITOT  --  <0.2*  --   PROT  --  7.5 7.2  ALBUMIN 1.2* 1.2*  --    No results found for this basename: LIPASE, AMYLASE,  in the last 168 hours No results found for this basename: AMMONIA,  in the last 168 hours CBC:  Recent Labs Lab 02/04/14 0615 02/06/14 0842  WBC 15.6* 16.3*  HGB 9.8* 9.2*  HCT 29.6* 28.7*  MCV 80.7 82.9  PLT 232 192   Urine Drug Screen: Drugs of Abuse     Component Value Date/Time   LABOPIA NONE DETECTED 01/17/2013 1058   COCAINSCRNUR POSITIVE* 01/17/2013 1058   LABBENZ NONE  DETECTED 01/17/2013 1058   AMPHETMU NONE DETECTED 01/17/2013 1058   THCU POSITIVE* 01/17/2013 1058   LABBARB NONE DETECTED 01/17/2013 1058     Scheduled Meds: . amLODipine  10 mg Oral Daily  . calcium carbonate  800 mg of elemental calcium Oral TID AC  . cloNIDine  0.2 mg Oral BID  . darbepoetin (ARANESP) injection - DIALYSIS  60 mcg Intravenous Q Wed-HD  . doxercalciferol  2 mcg Intravenous Q T,Th,Sa-HD  . heparin subcutaneous  5,000 Units Subcutaneous 3 times per day  . nicotine  7 mg Transdermal Daily  . [START ON 02/08/2014] predniSONE  40 mg Oral Q breakfast   Continuous Infusions: . sodium chloride 10 mL/hr (02/05/14 1119)   PRN Meds:.acetaminophen, cyclobenzaprine, hydrALAZINE, oxyCODONE-acetaminophen Assessment/Plan:  [name] is a 33yo woman with history of lupus nephritis untreated for 5 years who presents with ARF and L transudative pleural effusion in the setting of lupus nephritis flare. Patient  is receiving dialysis and having a permanent access placed while waiting for renal biopsy results.   1. Acute Renal Failure with FEna of 12.2% in the setting of untreated lupus nephritis for 5 years: Intrarenal cause of acute renal failure. HIV and HCV negative. Renal ultrasound showed normal size kidneys with increased echogenicity. L renal biopsy taken. Waiting on biopsy results. Patient has tunneled catheter placed for temporary RRT (3rd dialysis on 02/04/2014), and now has permanent access placed on L upper arm (02/05/2014).  Plan  -S/p 4 rounds of dialysis; last dialysis 2/10  -Waiting for renal biopsy results today for treatment course  -set up outpatient HD in place  -Appreciate Nephrology Recs  -Renal diet  -Strict I/Os  -Daily renal labs, and CBC  - discharged pending recommendation by renal service - could be discharged after HD tomorrow.  2. HTN: BP still remains elevated on amlodipine 10 mg daily Plan  -Amlodipine 10mg  qD  - schedule clonidine 0.2 mg bid  - hydralazine  for BP since 2/7  - might require addition of Spironolactone if BP continues to be elevated   3. SLE and lupus nephritis flare: patient has a history of lupus nephritis and has not been treated for 5 years. She has symptoms of flare with elevated ESR of 139, low C3 (59), though normal C4 level of 32, renal symptoms as described above, skin changes with livedo reticularis on leg starting in December.  -Prednisone 60mg  qD  -Hold mycophenolate pending status of renal ultrasound/possible biopsy (urine pregnancy is negative should this medication is to be used)    4. Pleural Effusion: CXR shows significant left pleural effusion, and diagnostic thoracentesis shows transudative fluid with fluid protein/ serum protein at <0.5, fluid LDH/serum LDH<0.6, and fluid LDH is not >upper 2/3 of serum LDH, indicative of a nephrotic syndrome. However, serum-effusion albumin difference is 0.6, indicating an exudative process, but the serum-effusion albumin difference of <1.2 is only specific in CHF patients. Thus, we will follow Light's criteria. Cell count in fluid does not reveal empyema or lupus pleuritis with WBC of 28, and lymphs of 21. LFTs are normal (AST 11, ALT 6). Repeat CXR on 2/10 showed persistent left effusion with patchy consolidation in the left lower lobe, slightly increased from 2/9.  -Monitor for re-expansion pulmonary edema and symptoms of active pleural effusion.  -Incentive spirometry  - Would still consider repeat therapeutic thoracocentesis certainly given risk for HCAP   5. Microcytic Anemia: Hgb stable at 9.2 on 2/10 after 2 pRBC transfusion on 2/3 with pre-transfusion hgb of 6.8. No daily CBC given stable hemoglobin  -Darbepoetin injection 17mcg weekly  -Transfuse if Hgb<7   6. Acidosis: CO2 of 21 on 02/03/2014  -stopped sodium bicarbonate as CO2 is corrected for loss. Currently, need to treat underlying renal failure and uremia to correct CO2 fully.   7. Secondary Hyperthyroidism with  hyperphosphatemia and hypocalcemia: phos of 6.5 and calcium of 7.0 corrected to 9.2 on 02/06/2014. PTH of 437.5. Phos goal 3.5-5.5, Calcium goal 8.4-9.5, and PTH goal 150-300. Studies have shown increased mortality when PTH is elevated at >300. VDRA on board to correct PTH as elevated PTH in patients with CKD has shown to have greater mortality.  -Calcium carbonate 1000mg  qAC  -Repeat PTH at outpatient in 3 to 6 months to monitor  -Stopped Doxercalciferol injection 77mcg due to corrected Ca is at goal.   8. Elevated WBC to 14.4  Patient has isolated asymptomatic WBC elevation in the setting of using high glucocorticoids  and meets no other SIRS criteria. Patient does have a pleural effusion, but that has been removed, and the fluid is found to be transudative. Elevated WBC most likely due to glucocorticoid use. MRSA PCR is negative for carrier status.  -Monitor for SIRS criteria and symptoms  Prophylaxis:    -DVT: 5,000 units of UFH because patient has nephrotic syndrome with severely low albumin level (1.2 on 2/6) and patient is s/p 48 hours from last procedure. No new procedures planned. We encouraged patient to ambulate.    Dispo: Disposition is deferred pending biopsy results, venous mapping and arranging for outpatient HD.   The patient does not have a current PCP (No PCP Per Patient), therefore will not be requiring OPC follow-up after discharge.   The patient does not have transportation limitations that hinder transportation to clinic appointments.  .Services Needed at time of discharge: Y = Yes, Blank = No PT:   OT:   RN:   Equipment:   Other:     LOS: 9 days   [name] - Internal Medicine Teaching Service Pager: [phone] 02/07/2014, 5:43 PM

## 2014-02-07 NOTE — Progress Notes (Signed)
Admit: 01/29/2014 LOS: 9  71F w/ hx/o IIIa/V SLE Nephritis (Bx 2006), CKD (BL SCr ~2 --> 13) not adherent to treatment x4-5y admitted with AKI vs CKD, SCr 13, UP/C 12.  Pt also with L sided pleural effusion, dyspnea.    Subjective:  HD#3 yest S/p Renal Bx 02/01/14- no word yet Has had thoracentesis- pulm just following for need of repeat thora VVS following , pt now s/p BVT 2/10 On 60 pred Breathing well, Good appetite  02/10 0701 - 02/11 0700 In: 580 [P.O.:580] Out: 1784 [Urine:1]  Filed Weights   02/06/14 0843 02/06/14 1222 02/06/14 2242  Weight: 59.7 kg (131 lb 9.8 oz) 57.9 kg (127 lb 10.3 oz) 58.5 kg (128 lb 15.5 oz)    Current meds: reviewed, s/p 3d pulse solumedrol 1g (2/2-2/4) Current Labs: reviewed    Physical Exam:  Blood pressure 154/98, pulse 98, temperature 98.6 F (37 C), temperature source Oral, resp. rate 18, height 5\' 4"  (1.626 m), weight 58.5 kg (128 lb 15.5 oz), last menstrual period 12/27/2013, SpO2 98.00%. NAD, awake, alert, appropriate- minimal pain after surgery RRR.  No rub. Nl s1s2 BS diminshed in L lower field.  CTA otherwise Trace to 1+ pitting edema Rash on legs unchanged Nonfocal, MAE NCAT- new left upper arm BVT placed 2/9 - bruit  Assessment/Plan 1. 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  I believe the pt very likely is new ESRD.  Would like to review path findings first- should have preliminary today- but will also start CLIP process.  Next HD will be tomorrow (thursday)  Lives close to [place] so will likely go there- I will work on schedule- good chance we will have by tomorrow so that she could be discharged after HD tomorrow.  2. SLE Nephritis: Renal Bx 2/5.  S/p pulse medrol 1g (2/2-2/4). Now on 60 mg pred daily, will decrease to 40 mg.  Normal C3, C4, and UA suggest that this is not an aggressive, acute GN.  Might just be scarring, IF/TA.  Biopsy showed almost all scar, nothing active to treat, will wean prednisone.   3. HTN: Amlodipine to 10mg /clonidine and hydralazine PRN .  Cont to inc UF goals at HD.  Prednisone likely not helping 4. Nephrotic Proteiniuria: as above. 5. Microcytic Anemia: Improved s/p 2u PRBC.  On ESA 6. Metabolic Acidosis: Improved with HD.  Stop PO NAHCO3 7. L pleural effusion: s/p thoracentesis- is till present 8. 2HPTH and Hyperphosphatemia: on Tums 1qAC.  PTH 438 on VDRA- hectorol 2   [name] A  02/07/2014, 12:14 PM   Recent Labs Lab 02/01/14 0448  02/04/14 0615 02/05/14 0637 02/06/14 0440  NA 138  < > 138 136* 138  K 4.5  < > 3.8 3.6* 3.6*  CL 104  < > 102 99 101  CO2 16*  < > 23 22 21   GLUCOSE 120*  < > 77 82 82  BUN 70*  < > 45* 63* 77*  CREATININE 9.35*  < > 4.80* 6.08* 6.69*  CALCIUM 6.5*  < > 7.0* 7.4* 7.0*  PHOS 6.5*  --   --   --   --   < > = values in this interval not displayed.  Recent Labs Lab 02/03/14 0430 02/04/14 0615 02/06/14 0842  WBC 14.0* 15.6* 16.3*  HGB 9.2* 9.8* 9.2*  HCT 26.8* 29.6* 28.7*  MCV 78.8 80.7 82.9  PLT 206 232 192

## 2014-02-07 NOTE — Progress Notes (Signed)
  Date: 02/07/2014  Patient name: [name]  Medical record number: [id number]  Date of birth: [birth year]   This patient has been seen and the plan of care was discussed with the house staff. Please see their note for complete details. I concur with their findings with the following additions/corrections:  See above for excelent MS4 note. Awaiting path results to be reviewed by Nephrology. CXR stable though ? Infiltrate. Pt without much symptoms. Would still  consider repeat therapeutic thoracocentesis certainly given risk for HCAP. Pt is now set up for HD as outpatient.   Truman Hayward, MD 02/07/2014, 5:32 PM

## 2014-02-08 ENCOUNTER — Encounter (HOSPITAL_COMMUNITY): Payer: Self-pay | Admitting: Vascular Surgery

## 2014-02-08 LAB — CBC
HCT: 25.9 % — ABNORMAL LOW (ref 36.0–46.0)
HEMATOCRIT: 26.7 % — AB (ref 36.0–46.0)
HEMOGLOBIN: 8.7 g/dL — AB (ref 12.0–15.0)
HEMOGLOBIN: 8.4 g/dL — AB (ref 12.0–15.0)
MCH: 27.1 pg (ref 26.0–34.0)
MCH: 26.8 pg (ref 26.0–34.0)
MCHC: 32.6 g/dL (ref 30.0–36.0)
MCHC: 32.4 g/dL (ref 30.0–36.0)
MCV: 83.2 fL (ref 78.0–100.0)
MCV: 82.7 fL (ref 78.0–100.0)
Platelets: 189 10*3/uL (ref 150–400)
Platelets: 185 10*3/uL (ref 150–400)
RBC: 3.21 MIL/uL — ABNORMAL LOW (ref 3.87–5.11)
RBC: 3.13 MIL/uL — AB (ref 3.87–5.11)
RDW: 16.8 % — ABNORMAL HIGH (ref 11.5–15.5)
RDW: 16.8 % — ABNORMAL HIGH (ref 11.5–15.5)
WBC: 28.2 10*3/uL — AB (ref 4.0–10.5)
WBC: 27.9 10*3/uL — ABNORMAL HIGH (ref 4.0–10.5)

## 2014-02-08 LAB — RENAL FUNCTION PANEL
Albumin: 1.3 g/dL — ABNORMAL LOW (ref 3.5–5.2)
BUN: 74 mg/dL — ABNORMAL HIGH (ref 6–23)
CO2: 23 meq/L (ref 19–32)
CREATININE: 6.29 mg/dL — AB (ref 0.50–1.10)
Calcium: 7.8 mg/dL — ABNORMAL LOW (ref 8.4–10.5)
Chloride: 100 mEq/L (ref 96–112)
GFR calc Af Amer: 9 mL/min — ABNORMAL LOW (ref >90)
GFR calc non Af Amer: 8 mL/min — ABNORMAL LOW (ref >90)
GLUCOSE: 104 mg/dL — AB (ref 70–99)
Phosphorus: 4 mg/dL (ref 2.3–4.6)
Potassium: 4.5 mEq/L (ref 3.7–5.3)
Sodium: 136 mEq/L — ABNORMAL LOW (ref 137–147)

## 2014-02-08 MED ORDER — OXYCODONE-ACETAMINOPHEN 5-325 MG PO TABS: 1.0000 | ORAL_TABLET | ORAL | Status: DC | PRN

## 2014-02-08 MED ORDER — AMLODIPINE BESYLATE 10 MG PO TABS: 10.0000 mg | ORAL_TABLET | Freq: Every day | ORAL | Status: DC

## 2014-02-08 MED ORDER — DARBEPOETIN ALFA-POLYSORBATE 60 MCG/0.3ML IJ SOLN: 60.0000 ug | INTRAMUSCULAR | Status: DC

## 2014-02-08 MED ORDER — NICOTINE 7 MG/24HR TD PT24: 7.0000 mg | MEDICATED_PATCH | Freq: Every day | TRANSDERMAL | Status: DC

## 2014-02-08 MED ORDER — PREDNISONE 10 MG PO TABS: ORAL_TABLET | ORAL | Status: DC

## 2014-02-08 MED ORDER — CLONIDINE HCL 0.2 MG PO TABS: 0.2000 mg | ORAL_TABLET | Freq: Two times a day (BID) | ORAL | Status: DC

## 2014-02-08 MED ORDER — DARBEPOETIN ALFA-POLYSORBATE 60 MCG/0.3ML IJ SOLN
INTRAMUSCULAR | Status: AC
  Filled 2014-02-08: qty 0.3

## 2014-02-08 MED ORDER — DOXERCALCIFEROL 4 MCG/2ML IV SOLN
INTRAVENOUS | Status: DC
Start: 2014-02-08 — End: 2014-02-08
  Filled 2014-02-08: qty 2

## 2014-02-08 NOTE — Progress Notes (Signed)
I have seen the patient and reviewed the daily progress note by Elvin So MS IV and discussed the care of the patient with them.  I agree with their documentation.   Signed:  Jessee Avers, MD PGY-2 Internal Medicine Teaching Service Pager: [phone]

## 2014-02-08 NOTE — Discharge Summary (Signed)
  Date: 02/08/2014  Patient name: [name]  Medical record number: [id number]  Date of birth: 1981-04-27   This patient has been seen and the plan of care was discussed with the house staff. Please see their note for complete details. I concur with their findings with the following additions/corrections:  See above excellent note by MS 4. Greatly appreciated help from nephrology vascular surgery and pulmonary care medicine. Patient will close followup post hospital discharge.  Truman Hayward, MD 02/08/2014, 9:57 PM

## 2014-02-08 NOTE — Discharge Instructions (Signed)
·   Thank you for allowing [name] to be involved in your healthcare while you were hospitalized at [facility].   Please note that there have been changes to your home medications.  --> PLEASE LOOK AT YOUR DISCHARGE MEDICATION LIST FOR DETAILS.  Please be sure to follow up with internal medicine center  Please keep appointment with the dialysis center - at [facility] at [place], [place] [id number]  Please also keep appointment with Dr Sherren Mocha for your graft.   Please call your PCP if you have any questions or concerns, or any difficulty getting any of your medications.  Please return to the ER if you have worsening of your symptoms or new severe symptoms arise.

## 2014-02-08 NOTE — Progress Notes (Signed)
Admit: 01/29/2014 LOS: 10  40F w/ hx/o IIIa/V SLE Nephritis (Bx 2006), CKD (BL SCr ~2 --> 13) not adherent to treatment x4-5y admitted with AKI vs CKD, SCr 13, UP/C 12.  Pt also with L sided pleural effusion, dyspnea.    Subjective:  HD#3 Tuesday S/p Renal Bx 02/01/14- no word yet Has had thoracentesis- pulm just following for need of repeat thora  pt now s/p BVT 2/10 On 60 pred- decreased to 40 Breathing well, Good appetite  02/11 0701 - 02/12 0700 In: 1350 [P.O.:960; I.V.:390] Out: 0   Filed Weights   02/06/14 2242 02/07/14 2112 02/08/14 0655  Weight: 58.5 kg (128 lb 15.5 oz) 67.359 kg (148 lb 8 oz) 67.6 kg (149 lb 0.5 oz)    Current meds: reviewed, s/p 3d pulse solumedrol 1g (2/2-2/4) Current Labs: reviewed    Physical Exam:  Blood pressure 129/58, pulse 85, temperature 98.5 F (36.9 C), temperature source Oral, resp. rate 18, height 5\' 4"  (1.626 m), weight 67.6 kg (149 lb 0.5 oz), last menstrual period 12/27/2013, SpO2 98.00%. NAD, awake, alert, appropriate- minimal pain after surgery RRR.  No rub. Nl s1s2 BS diminshed in L lower field.  CTA otherwise Trace to 1+ pitting edema Rash on legs unchanged Nonfocal, MAE NCAT- new left upper arm BVT placed 2/9 - bruit  Assessment/Plan 1. AoCKD in setting of hx/o SLE III/V nephritis: HD #3 Tuesday   No heparin- but will be able to use tight heparin as OP.  I believe the pt very likely is new ESRD.    On HD right now (thursday)  Lives close to [place] - Has spot there to start on Friday (tomorrow) - will run a little short today and patient is then cleared from my standpoint for discharge to go to [facility] tomorrow 2. SLE Nephritis: Renal Bx 2/5 showing basically scar.  S/p pulse medrol 1g (2/2-2/4). Now on 60 mg pred daily, will decrease to 40 mg and continue to wean as OP.  Normal C3, C4, and UA suggest that this is not an aggressive, acute GN.  Might just be scarring, IF/TA.   3. HTN: Amlodipine 10mg  will change administration to q  HS/clonidine and hydralazine PRN will not be needed .  Cont to inc UF goals at HD.  Prednisone likely not helping 4. Nephrotic Proteiniuria: as above. 5. Microcytic Anemia: Improved s/p 2u PRBC this hospitalization.  On ESA 6. Metabolic Acidosis: Improved with HD.  Stop PO NAHCO3 7. L pleural effusion: s/p thoracentesis- is till present 8. 2HPTH and Hyperphosphatemia: on Tums 1qAC.  PTH 438 on VDRA- hectorol 2   Dailey Alberson A  02/08/2014, 7:28 AM   Recent Labs Lab 02/04/14 0615 02/05/14 0637 02/06/14 0440  NA 138 136* 138  K 3.8 3.6* 3.6*  CL 102 99 101  CO2 23 22 21   GLUCOSE 77 82 82  BUN 45* 63* 77*  CREATININE 4.80* 6.08* 6.69*  CALCIUM 7.0* 7.4* 7.0*    Recent Labs Lab 02/06/14 0842 02/08/14 0505 02/08/14 0629  WBC 16.3* 28.2* 27.9*  HGB 9.2* 8.7* 8.4*  HCT 28.7* 26.7* 25.9*  MCV 82.9 83.2 82.7  PLT 192 189 185

## 2014-02-08 NOTE — Progress Notes (Signed)
Medical Student Daily Progress Note  Subjective: No events overnight. Objective: Vital signs in last 24 hours: Filed Vitals:   02/08/14 0655 02/08/14 0700 02/08/14 0730 02/08/14 0800  BP: 145/84 129/58 165/89 129/82  Pulse: 88 85 102 104  Temp: 98.5 F (36.9 C)     TempSrc: Oral     Resp: 18     Height:      Weight: 67.6 kg (149 lb 0.5 oz)     SpO2: 98%      Weight change: 7.659 kg (16 lb 14.2 oz)  Intake/Output Summary (Last 24 hours) at 02/08/14 8546 Last data filed at 02/08/14 0700  Gross per 24 hour  Intake   1590 ml  Output      0 ml  Net   1590 ml   Physical Exam: BP 120/77  Pulse 105  Temp(Src) 98 F (36.7 C) (Oral)  Resp 18  Ht _0  (1.626 m)  Wt 64.4 kg (141 lb 15.6 oz)  BMI 24.36 kg/m2  SpO2 100%  LMP 12/27/2013 General appearance: alert and no distress Neck: supple, symmetrical, trachea midline  Back: L renal biopsy site and L thoracentesis sites clean and non draining  Lungs: diminished breath sounds base - left, dullness to percussion base - left and egophony improving in LLL, but sound is still distant  Heart: Tachycardic, regular rhythm, normal S1 and S2, no murmurs, gallops, or rubs  Abdomen: soft, non-tender; bowel sounds normal; no masses, no organomegaly  Extremities: No edema, not much changed with pedal edema from admission  Skin: 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  Lab Results: Lab Results  Component Value Date   CREATININE 6.29* 02/08/2014   BUN 74* 02/08/2014   NA 136* 02/08/2014   K 4.5 02/08/2014   CL 100 02/08/2014   CO2 23 02/08/2014   Lab Results  Component Value Date   CALCIUM 7.8* 02/08/2014   PHOS 4.0 02/08/2014    CBC    Component Value Date/Time   WBC 27.9* 02/08/2014 0629   RBC 3.13* 02/08/2014 0629   RBC 2.68* 01/31/2014 0536   HGB 8.4* 02/08/2014 0629   HCT 25.9* 02/08/2014 0629   PLT 185 02/08/2014 0629   MCV 82.7 02/08/2014 0629   MCH 26.8 02/08/2014 0629   MCHC 32.4 02/08/2014 0629   RDW 16.8* 02/08/2014 0629    LYMPHSABS 0.6* 01/30/2014 0616   MONOABS 0.1 01/30/2014 0616   EOSABS 0.1 01/30/2014 0616   BASOSABS 0.0 01/30/2014 2703     Micro Results: Recent Results (from the past 240 hour(s))  SURGICAL PCR SCREEN     Status: None   Collection Time    02/05/14  4:51 AM      Result Value Ref Range Status   MRSA, PCR NEGATIVE  NEGATIVE Final   Staphylococcus aureus NEGATIVE  NEGATIVE Final   Comment:            The Xpert SA Assay (FDA     approved for NASAL specimens     in patients over 55 years of age),     is one component of     a comprehensive surveillance     program.  Test performance has     been validated by [employer] for patients greater     than or equal to 22 year old.     It is not intended     to diagnose infection nor to     guide or monitor treatment.  Studies/Results: No results found. Medications: I have reviewed the patient's current medications. Scheduled Meds: . amLODipine  10 mg Oral Daily  . calcium carbonate  800 mg of elemental calcium Oral TID AC  . cloNIDine  0.2 mg Oral BID  . darbepoetin      . darbepoetin (ARANESP) injection - DIALYSIS  60 mcg Intravenous Q Wed-HD  . doxercalciferol      . doxercalciferol  2 mcg Intravenous Q T,Th,Sa-HD  . heparin subcutaneous  5,000 Units Subcutaneous 3 times per day  . nicotine  7 mg Transdermal Daily  . predniSONE  40 mg Oral Q breakfast   Continuous Infusions: . sodium chloride 10 mL/hr (02/05/14 1119)   PRN Meds:.acetaminophen, cyclobenzaprine, hydrALAZINE, oxyCODONE-acetaminophen Assessment/Plan: Principal Problem:   ESRD on dialysis Active Problems:   Acute systemic lupus erythematosus   Lupus nephritis   Acute renal failure   Microcytic anemia   Hypocalcemia   Acute uremia   Pleural effusion, left   Metabolic acidosis, increased anion gap (IAG)   Hypoalbuminemia   Nephrotic syndrome   LOS: 10 days   [name] is a 33yo woman with history of lupus nephritis untreated for 5 years who presents  with ARF and L transudative pleural effusion in the setting of lupus nephritis flare. Patient is receiving dialysis and has permanent access placed. She is waiting for renal biopsy results but has been placed in [facility] for outpatient dialysis.   1. Acute Renal Failure with FEna of 12.2% in the setting of untreated lupus nephritis for 5 years: Intrarenal cause of acute renal failure. HIV and HCV negative. Renal ultrasound showed normal size kidneys with increased echogenicity. L renal biopsy taken. Waiting on biopsy results. Patient has tunneled catheter placed for temporary RRT (3rd dialysis on 02/04/2014), and now has permanent access placed on L upper arm (02/05/2014). Patient has been accepted to [facility] on MWF 2nd shift schedule per [facility] coordinator. Patient will start outpatient dialysis at 10:45am on 02/09/14.  -S/p 5 rounds of dialysis; last dialysis 2/12 -Waiting for renal biopsy results today for treatment course  -Appreciate Nephrology Recs  -Renal diet  -Strict I/Os   2. High blood pressure without organ damage: Patient was dialyzed on 01/31/2014, and had blood pressure >190 after. Patient has experienced minor headache from sudden decrease of BP to <140/90. Patient has had one BP reading in 180 on 2/9 that required Clonidine. Patient's baseline BP ranges from 130's to 150's with most reading ins 130's to 140's. Patient's goal would be <140/90. Since patient is on max dose of amlodipine and she is receiving dialysis, will let dialysis center know of her increased blood pressure during the next treatment.  -Stop Clonidine and hydralazine PRN orders in preparation to go home. Patient has not needed Clonidine or hydralazine for BP since 2/7  -Amlodipine 62m qD per nephrology recs   3. SLE and lupus nephritis flare: patient has a history of lupus nephritis and has not been treated for 5 years. 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  -Prednisone 670mqD, can taper when going home -Hold mycophenolate pending status of renal ultrasound/possible biopsy (urine pregnancy is negative should this medication is to be used)   4. Pleural Effusion: CXR shows significant left pleural effusion, and diagnostic thoracentesis shows transudative fluid with fluid protein/  serum protein at <0.5, fluid LDH/serum LDH<0.6, and fluid LDH is not >upper 2/3 of serum LDH, indicative of a nephrotic syndrome. However, serum-effusion albumin difference is 0.6, indicating an exudative process, but the serum-effusion albumin difference of <1.2 is only specific in CHF patients. Thus, we will follow Light's criteria. Cell count in fluid does not reveal empyema or lupus pleuritis with WBC of 28, and lymphs of 21. LFTs are normal (AST 11, ALT 6). Repeat CXR on 2/10 showed persistent left effusion with patchy consolidation in the left lower lobe, slightly increased from 2/9.  -Monitor for re-expansion pulmonary edema and symptoms of active pleural effusion.  -Incentive spirometry   5. Microcytic Anemia: Hgb stable at 9.2 on 2/10 after 2 pRBC transfusion on 2/3 with pre-transfusion hgb of 6.8. No daily CBC given stable hemoglobin  -Darbepoetin injection 56mg weekly  -Transfuse if Hgb<7   6. Acidosis: CO2 of 21 on 02/03/2014  -stopped sodium bicarbonate as CO2 is corrected for loss. Currently, need to treat underlying renal failure and uremia to correct CO2 fully.   7. Secondary Hyperthyroidism with hyperphosphatemia and hypocalcemia: phos of 4.0 and calcium of 7.8 corrected to 9.8 on 02/06/2014. PTH of 437.5. Phos goal 3.5-5.5, Calcium goal 8.4-9.5, and PTH goal 150-300. Studies have shown increased mortality when PTH is elevated at >300. VDRA on board to correct PTH as elevated PTH in patients with CKD has shown to have greater mortality.   -Calcium carbonate 1003mqAC  -Repeat PTH at outpatient in 3 to 6 months to monitor  -Stopped Doxercalciferol injection [id number] to corrected Ca is at goal.   8. Elevated WBC to 14.4  Patient has isolated asymptomatic WBC elevation in the setting of using high glucocorticoids and meets no other SIRS criteria. Patient does have a pleural effusion, but that has been removed, and the fluid is found to be transudative. Elevated WBC most likely due to glucocorticoid use. MRSA PCR is negative for carrier status.  -Monitor for SIRS criteria and symptoms   Prophylaxis:  -DVT: 5,000 units of UFH TID because patient has nephrotic syndrome with severely low albumin level (1.2 on 2/6) and patient is s/p 48 hours from last procedure. No new procedures planned. We encouraged patient to ambulate.    Dispo: Patient can be discharged home with follow up with PCP and [facility]  The care of the patient was discussed with Dr. VanTommy Medald the assessment and plan formulated with their assistance.  Please see their attached note for official documentation of the daily encounter.  JiaKonrad Saha12/2015, 8:32 AM

## 2014-02-08 NOTE — Discharge Summary (Signed)
Name: [name] MRN: [id number] DOB: February 07, 1981 33 y.o. PCP: No Pcp Per Patient  Date of Admission: 01/29/2014  6:46 PM Date of Discharge: 02/08/2014 Attending Physician: Truman Hayward, MD  Discharge Diagnosis: Principal Problem:   ESRD on dialysis Active Problems:   Acute systemic lupus erythematosus   Lupus nephritis   Acute renal failure   Microcytic anemia   Hypocalcemia   Acute uremia   Pleural effusion, left   Metabolic acidosis, increased anion gap (IAG)   Hypoalbuminemia   Nephrotic syndrome  Discharge Medications:   Medication List    STOP taking these medications       acetaminophen 500 MG tablet  Commonly known as:  TYLENOL      TAKE these medications       amLODipine 10 MG tablet  Commonly known as:  NORVASC  Take 1 tablet (10 mg total) by mouth daily.     cloNIDine 0.2 MG tablet  Commonly known as:  CATAPRES  Take 1 tablet (0.2 mg total) by mouth 2 (two) times daily.     darbepoetin 60 MCG/0.3ML Soln injection  Commonly known as:  ARANESP  Inject 0.3 mLs (60 mcg total) into the vein every Wednesday with hemodialysis.     diphenhydramine-acetaminophen 25-500 MG Tabs  Commonly known as:  TYLENOL PM  Take 1 tablet by mouth at bedtime as needed (sleep).     predniSONE 10 MG tablet  Commonly known as:  DELTASONE  - Take 30 mg daily with breakfast for three days, then   - Take 20 mg daily with breakfast for three days and then   - Take 10 mg daily with breakfast for three days and stop        Disposition and follow-up:   [name] was discharged from [facility] in Good condition.  At the hospital follow up visit please address:  1. Active issues need to be addressed: -Future treatment of lupus nephritis based on renal biopsy results, and referral rheumatology.  -Patient also need outpatient management of HTN discovered during hospitalization. Her medications may need to be adjusted.   2.  Labs / imaging  needed at time of follow-up: Followup chest x-ray for effusion.   3.  Pending labs/ test needing follow-up:  - L renal biopsy results are still pending at the time of discharge. Likely these results will be available to nephrology.  Follow-up Appointments:     Follow-up Information   Follow up with EARLY, TODD, MD In 4 weeks. (office will call you to arrange an appt (sent))    Specialty:  Vascular Surgery   Contact information:   [address] [place] [address] [phone]       Follow up with Ivor Costa, MD On 02/15/2014. (8:45am)    Specialty:  Internal Medicine   Contact information:   [place] [address] [phone]       Discharge Instructions: Discharge Orders   Future Appointments Provider Department Dept Phone   02/15/2014 8:45 AM Ivor Costa, MD [place] [phone]   03/13/2014 11:00 AM [facility] [place] [phone]   03/13/2014 12:00 PM [name], MD [facility] [phone]   Future Orders Complete By Expires   Call MD for:  difficulty breathing, headache or visual disturbances  As directed    Call MD for:  extreme fatigue  As directed    Call MD  for:  persistant dizziness or light-headedness  As directed    Call MD for:  persistant nausea and vomiting  As directed    Call MD for:  temperature >100.4  As directed    Diet - low sodium heart healthy  As directed    Increase activity slowly  As directed       Consultations: Treatment Team:  Rexene Agent, MD  Procedures Performed:  Dg Chest 2 View  01/29/2014   CLINICAL DATA:  Chest pain and shortness of breath.  EXAM: CHEST  2 VIEW  COMPARISON:  01/17/2013.  FINDINGS: Interval moderate to large-sized left pleural effusion and small right pleural effusion. No gross change borderline enlargement of the cardiac silhouette. Left basilar atelectasis adjacent to the pleural fluid. Unremarkable bones.   IMPRESSION: 1. Interval moderate to large-sized left pleural effusion with adjacent left basilar atelectasis. 2. Interval small right pleural effusion.   Electronically Signed   By: Enrique Sack M.D.   On: 01/29/2014 19:33   US Renal  01/30/2014   CLINICAL DATA:  Acute renal insufficiency.  EXAM: RENAL/URINARY TRACT ULTRASOUND COMPLETE  COMPARISON:  01/17/2013  FINDINGS: Right Kidney:  Length: 11.6 cm. Marked increased echogenicity of the renal parenchyma since the prior study. No hydronephrosis. There is a small amount of fluid in the perinephric space.  Left Kidney  Length: 10.1 cm. Diffuse increased echogenicity of the kidney, markedly increased since the prior exam. There is small amount of perinephric fluid around the kidney.  Bladder:  Ascites around the normal appearing bladder.  IMPRESSION: Marked increased echogenicity of the renal parenchyma since the prior study of 01/17/2013, consistent with renal medical disease.  Ascites.  Small amount of perinephric fluid bilaterally, nonspecific.   Electronically Signed   By: Rozetta Nunnery M.D.   On: 01/30/2014 20:50   US Biopsy  02/01/2014   CLINICAL DATA:  Lupus and renal failure. The patient requires renal biopsy to assess degree of kidney disease.  EXAM: ULTRASOUND GUIDED CORE BIOPSY OF LEFT KIDNEY  MEDICATIONS: 2.0 mg IV Versed; 100 mcg IV Fentanyl  Total Moderate Sedation Time: 11 min.  PROCEDURE: The procedure, risks, benefits, and alternatives were explained to the patient. Questions regarding the procedure were encouraged and answered. The patient understands and consents to the procedure.  The left flank region was prepped with Betadine in a sterile fashion, and a sterile drape was applied covering the operative field. A sterile gown and sterile gloves were used for the procedure. Local anesthesia was provided with 1% Lidocaine.  Ultrasound was performed of both kidneys. The left was chosen for biopsy. Under direct ultrasound guidance, a 16 gauge core  device was advanced into the lower pole cortex of the left kidney. Two samples were obtained and submitted in saline.  COMPLICATIONS: None.  FINDINGS: Both kidneys are markedly echogenic which represents significant progression in appearance of the kidneys since a prior biopsy procedure in 2006. No hydronephrosis is identified. Solid tissue samples were obtained from the left lower pole cortex. There were no immediate bleeding complications.  IMPRESSION: Ultrasound-guided core biopsy performed of the lower pole of the left kidney.   Electronically Signed   By: Aletta Edouard M.D.   On: 02/01/2014 16:34   Dg Chest Port 1 View  02/06/2014   CLINICAL DATA:  Infiltrate  EXAM: PORTABLE CHEST - 1 VIEW  COMPARISON:  February 02, 2014  FINDINGS: There is a persistent left effusion with patchy consolidation in the left lower lobe, slightly increased. Right  lung is clear. Heart is enlarged with normal pulmonary vascularity, stable. Dual-lumen catheter tip is at the cavoatrial junction. No pneumothorax. No adenopathy. Postoperative change is noted in the left upper arm region medially.  IMPRESSION: Persistent cardiomegaly. Persistent left effusion with slight increase and left lower lobe infiltrate. Right lung clear.   Electronically Signed   By: Lowella Grip M.D.   On: 02/06/2014 07:59   Dg Chest Port 1 View  02/05/2014   CLINICAL DATA:  Evaluate pleural effusion  EXAM: PORTABLE CHEST - 1 VIEW  COMPARISON:  DG CHEST 1V PORT dated 02/02/2014; DG CHEST PORT 1VSAME DAY dated 01/31/2014; DG CHEST 2 VIEW dated 01/29/2014  FINDINGS: Grossly unchanged cardiac silhouette and mediastinal contours with partial obscuration of the left heart border secondary to minimally decreased though persistent left-sided pleural effusion and associated left mid and lower lung heterogeneous / consolidative opacities. Stable positioning of support apparatus. No pneumothorax. No new focal airspace opacities. No evidence of edema. Unchanged bones.   IMPRESSION: Suspected minimal reduction in persistent small sized left-sided effusion.   Electronically Signed   By: Sandi Mariscal M.D.   On: 02/05/2014 17:18   Dg Chest Port 1 View  02/02/2014   CLINICAL DATA:  Thoracentesis.  EXAM: PORTABLE CHEST - 1 VIEW  COMPARISON:  US BIOPSY dated 02/01/2014; DG CHEST PORT 1VSAME DAY dated 01/31/2014; DG CHEST 2 VIEW dated 01/29/2014  FINDINGS: Decreased left pleural effusion, still moderate. There is no pneumothorax. Right IJ dialysis catheter is present. There is no right pleural effusion. The cardiopericardial silhouette appears similar in size and configuration compared to prior.  IMPRESSION: Left thoracentesis with persistent moderate left pleural effusion and collapse of the left lower lobe. No pneumothorax   Electronically Signed   By: Dereck Ligas M.D.   On: 02/02/2014 11:05   Dg Chest Port 1v Same Day  01/31/2014   CLINICAL DATA:  Dialysis catheter placement.  EXAM: PORTABLE CHEST - 1 VIEW SAME DAY  COMPARISON:  DG CHEST 2 VIEW dated 01/29/2014  FINDINGS: Interim placement of dialysis catheter. Tip is at the cavoatrial junction. Cardiomegaly, normal pulmonary vascularity. Persistent large left-sided pleural effusion. Left base atelectasis and/or pneumonia cannot be excluded. Small right pleural effusion not identified on today's exam . No pneumothorax. No acute osseous abnormality.  IMPRESSION: 1. And replacement dialysis catheter with tips at the cavoatrial junction. 2. Cardiomegaly, no pulmonary venous congestion. 3. Persistent large left-sided pleural effusion. Left base atelectasis and/or pneumonia cannot be excluded. These findings are stable from prior exam. Small right pleural effusion not identified on today's exam.   Electronically Signed   By: Marcello Moores  Register   On: 01/31/2014 13:35    2D Echo: none  Cardiac Cath: none  Admission HPI:  The patient is a 33 yo woman, history of SLE, lupus nephritis, polysubstance abuse, presenting with shortness of  breath. The patient notes a 28-monthhistory of symptoms SOB, progressive DOE now only able to walk to her mailbox, orthopnea requiring her to sleep upright, and increasing LE and abdominal edema, though no PND. The patient notes several other associated symptoms for the last month, including fatigue, an erythematous patchy rash on her bilateral legs, a non-productive cough, bilateral lateral chest pain, mostly posterior lower chest, that worse when she lies flat, polyuria, nocturia (every 1 hour), "bubbles" in her urine, and 4-5 loose stools per day. She notes no fever, blurry vision, dysuria, myalgias/arthralgias, hair changes, nausea/vomiting, abd pain, or appetite/weight changes. She notes taking about 4 Advil PM  during the past weekend to help her sleep, as well as a few Tylenol PM, otherwise no significant use of OTC meds.  The patient has a history of Lupus, previously managed by Dr. Charlestine Night (Rheum), Coladonato (Renal), and Nishan (Cards). However, the patient notes discontinuing her lupus meds (Cellcept) about 2 years ago due to feeling as if she didn't need these medications as she was doing well, and not following up with her outpatient doctors. She has a history of lupus nephritis, diagnosed by renal biopsy in 2006.   Hospital Course by problem list: Principal Problem:   ESRD on dialysis Active Problems:   Acute systemic lupus erythematosus   Lupus nephritis   Acute renal failure   Microcytic anemia   Hypocalcemia   Acute uremia   Pleural effusion, left   Metabolic acidosis, increased anion gap (IAG)   Hypoalbuminemia   Nephrotic syndrome   Ms. Veiga is a 33yo woman with history of lupus nephritis untreated for 5 years who presents with ARF and L transudative pleural effusion in the setting of lupus nephritis flare.   1. ESRD on Dialysis: with FEna of 12.2% in the setting of untreated lupus nephritis for 5 years. 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 Patient tested negative for HIV and HCV. Admission labs showed Cr of 13.27 and BUN of 77, UPC of 12, low albumin (1.2), proteinuria (24h Urine protein 715g), acidemia (HCO3- of 9 with anion gap of 20), hyperphosphatemia (phos 10.5), hypocalcemia (Ca 6.0), and anemia (Hgb 7.0). Lupus nephritis flare was treated with IV methylprednisolone 1,067m IV for 3 days before switching to prednisone 680mfor the rest of her hospital stay and discharged on a short steroid taper. Nephrology service was consulted and they assisted in her care. Renal ultrasound showed normal size kidneys with increased echogenicity. L renal biopsy was taken, but results are still pending at time of discharge. Patient required hemodialysis and received temporary RRT through tunneled catheter placed on 01/31/2014. Permanent access was placed on L upper arm on 02/05/2014. Patient received 5 rounds of dialysis (last one on 2/12) before being discharged with outpatient dialysis. She will follow up with outpatient nephrology. Patient was discharged with 30 pills of Percocet, and due to pain or to her graft to site. This should be evaluated as outpatient to determine proper analgesia.   2. Hypertension: Patient's baseline blood pressure ranged from 140-160's/100-110's on admission. Patient will continue on amlodipine 1016mD, clonidine 0.2mg54m. She follow up with outpatient clinic and changes to her medications can be addressed.   3. SLE and lupus nephritis flare: patient was diagnosed 6 years ago with lupus nephritis after an episode of pericarditis. 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 She was treated for one year with mycophenolate and  glucocorticoids before patient decided to stop all treatments. She has not been treated for 5 years, and she presented with symptoms of flare with elevated ESR of 139, low C3 (59), though normal C4 level of 32, renal symptoms as described above, skin changes with livedo reticularis on leg starting in December, 2014. Patient received IV methylprednisolone 1,000mg42mfor 3 days before switching to prednisone 60mg 59mthe rest of her hospital stay. Mycophenolate was held as patient  is waiting for biopsy results like high likelihood of entering ESRD. Outpatient referral to rheumatology is recommended.   4. Pleural Effusion: CXR showed significant left pleural effusion but the was not significantly symptomatic. She had no pneumonia symptoms. A diagnostic thoracentesis on 02/02/2014. Laboratory pleural fluid analysis was consistent with a positive pleural effusion likely due to renal failure. Repeat CXR on 2/10 showed persistent left effusion with patchy consolidation in the left lower lobe, slightly increased from 2/9. This presentation is likely due to re-expansion pulmonary edema.  However, patient never showed symptoms of infection. Incentive spirometry was encouraged. As outpatient, she will require repeat chest x ray.   5. Microcytic Anemia: Patient's Hgb has been dropping from 10.1 (09/01/13) to 8.3 (01/29/14) to 7.0 (01/30/14) with MCV of 76. Iron panel shows low iron, elevated ferritin, but normal TIBC. This is most likely anemia of chronic disease with some iron deficiency anemia. Patient received 2 pRBC transfusion on 01/30/14 when Hgb was found to be 6.8, but she was asymptomatic. The average hemoglobin for the rest of her hospitalization was 9-10. Patient was started on Darbepoetin injection 76mg weekly.   6. Mixed Metabolic Acidosis: Patient initially presented with HCO3- of 9 with anion gap of 20, delta-delta ratio of 0.3 indicating both anion gap and non-anion gap metabolic acidosis are present. Renal  inability to retain HCO3- is likely the cause as well as uremic syndrome. Patient received sodium bicarbonate until HCO3- is corrected to 13 on 02/03/2014. Patient's metabolic acidosis corrected with dialysis during the rest of the hospitalization.   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 Patient received calcium carbonate 10069mqAC and doxercalciferol 10m29mto attempt to correct PTH level to 150-300. Studies have shown increased mortality when PTH is elevated at >300 in dialysis patients. Both treatments were stopped on discharge because phosphorus and calcium values normalized to 4.0 and 7.8 (corrected to 9.8). Patient will need ot have PTH rechecked in 3 to 6 months in April to July to evaluate for treatment effectiveness. And  8. Elevated WBC to 14.4  Patient has isolated asymptomatic WBC elevation in the setting of using high glucocorticoids and meets no other SIRS criteria. Patient does have a pleural effusion, but that has been removed, and the fluid is found to be transudative. Elevated WBC most likely due to glucocorticoid use. MRSA PCR is negative for carrier status.   Discharge Vitals:   BP 120/77  Pulse 105  Temp(Src) 98 F (36.7 C) (Oral)  Resp 18  Ht 5' 4" (1.626 m)  Wt 64.4 kg (141 lb 15.6 oz)  BMI 24.36 kg/m2  SpO2 100%  LMP 12/27/2013  Discharge Labs:  Results for orders placed during the hospital encounter of 01/29/14 (from the past 24 hour(s))  CBC     Status: Abnormal   Collection Time    02/08/14  5:05 AM      Result Value Ref Range   WBC 28.2 (*) 4.0 - 10.5 K/uL   RBC 3.21 (*) 3.87 - 5.11 MIL/uL   Hemoglobin 8.7 (*) 12.0 - 15.0 g/dL   HCT 26.7 (*) 36.0 - 46.0 %   MCV 83.2  78.0 - 100.0 fL   MCH 27.1  26.0 - 34.0 pg   MCHC 32.6  30.0 - 36.0 g/dL   RDW 16.8 (*) 11.5 - 15.5 %   Platelets 189  150 - 400 K/uL  CBC  Status: Abnormal    Collection Time    02/08/14  6:29 AM      Result Value Ref Range   WBC 27.9 (*) 4.0 - 10.5 K/uL   RBC 3.13 (*) 3.87 - 5.11 MIL/uL   Hemoglobin 8.4 (*) 12.0 - 15.0 g/dL   HCT 25.9 (*) 36.0 - 46.0 %   MCV 82.7  78.0 - 100.0 fL   MCH 26.8  26.0 - 34.0 pg   MCHC 32.4  30.0 - 36.0 g/dL   RDW 16.8 (*) 11.5 - 15.5 %   Platelets 185  150 - 400 K/uL  RENAL FUNCTION PANEL     Status: Abnormal   Collection Time    02/08/14  7:00 AM      Result Value Ref Range   Sodium 136 (*) 137 - 147 mEq/L   Potassium 4.5  3.7 - 5.3 mEq/L   Chloride 100  96 - 112 mEq/L   CO2 23  19 - 32 mEq/L   Glucose, Bld 104 (*) 70 - 99 mg/dL   BUN 74 (*) 6 - 23 mg/dL   Creatinine, Ser 6.29 (*) 0.50 - 1.10 mg/dL   Calcium 7.8 (*) 8.4 - 10.5 mg/dL   Phosphorus 4.0  2.3 - 4.6 mg/dL   Albumin 1.3 (*) 3.5 - 5.2 g/dL   GFR calc non Af Amer 8 (*) >90 mL/min   GFR calc Af Amer 9 (*) >90 mL/min    Signed: Konrad Saha, Med Student 02/08/2014, 1:45 PM   Time Spent on Discharge: 45 minutes Services Ordered on Discharge: none Equipment Ordered on Discharge: none

## 2014-02-08 NOTE — Procedures (Signed)
Patient was seen on dialysis and the procedure was supervised.  BFR 400  Via PC BP is  129/58.   Patient appears to be tolerating treatment well  [name] A 02/08/2014

## 2014-02-08 NOTE — Progress Notes (Signed)
Patient discharged to home. Patient AVS and outpatient hemodialysis schedule reviewed. Patient verbalized understanding of medications and follow-up appointments.  Patient remains stable; no signs or symptoms of distress.  Patient educated to return to the ER in cases of SOB, dizziness, fever, chest pain, or fainting.

## 2014-02-09 DIAGNOSIS — D631 Anemia in chronic kidney disease: Secondary | ICD-10-CM | POA: Insufficient documentation

## 2014-02-09 DIAGNOSIS — N2581 Secondary hyperparathyroidism of renal origin: Secondary | ICD-10-CM | POA: Insufficient documentation

## 2014-02-10 DIAGNOSIS — D689 Coagulation defect, unspecified: Secondary | ICD-10-CM | POA: Insufficient documentation

## 2014-02-12 ENCOUNTER — Encounter (HOSPITAL_COMMUNITY): Payer: Self-pay

## 2014-02-12 DIAGNOSIS — D509 Iron deficiency anemia, unspecified: Secondary | ICD-10-CM | POA: Insufficient documentation

## 2014-02-15 ENCOUNTER — Encounter: Payer: Self-pay | Admitting: Internal Medicine

## 2014-02-15 ENCOUNTER — Ambulatory Visit (INDEPENDENT_AMBULATORY_CARE_PROVIDER_SITE_OTHER): Payer: Medicaid Other | Admitting: Internal Medicine

## 2014-02-15 VITALS — BP 131/89 | HR 94 | Temp 98.8°F | Ht 65.0 in | Wt 142.9 lb

## 2014-02-15 DIAGNOSIS — Z992 Dependence on renal dialysis: Secondary | ICD-10-CM

## 2014-02-15 DIAGNOSIS — F17200 Nicotine dependence, unspecified, uncomplicated: Secondary | ICD-10-CM

## 2014-02-15 DIAGNOSIS — Z720 Tobacco use: Secondary | ICD-10-CM | POA: Insufficient documentation

## 2014-02-15 DIAGNOSIS — N186 End stage renal disease: Secondary | ICD-10-CM

## 2014-02-15 DIAGNOSIS — J90 Pleural effusion, not elsewhere classified: Secondary | ICD-10-CM

## 2014-02-15 DIAGNOSIS — Z0000 Encounter for general adult medical examination without abnormal findings: Secondary | ICD-10-CM | POA: Insufficient documentation

## 2014-02-15 DIAGNOSIS — M329 Systemic lupus erythematosus, unspecified: Secondary | ICD-10-CM

## 2014-02-15 DIAGNOSIS — M79602 Pain in left arm: Secondary | ICD-10-CM

## 2014-02-15 MED ORDER — OXYCODONE-ACETAMINOPHEN 5-325 MG PO TABS: 1.0000 | ORAL_TABLET | ORAL | Status: DC | PRN

## 2014-02-15 MED ORDER — NICOTINE 7 MG/24HR TD PT24: 7.0000 mg | MEDICATED_PATCH | Freq: Every day | TRANSDERMAL | Status: DC

## 2014-02-15 NOTE — Assessment & Plan Note (Signed)
Patient had acute flare up of her SLE with elevated ESR of 139 and low C3 (59). She was treated with high dose of  IV methylprednisolone 1,000 mg for 3 days, then switched to prednisone 60 mg and discharge on tapering does of prednisone. The patient was followed up with Dr. Charlestine Night before, but has not been seen for long time.   -will give her referral to rheumatology for further evaluation and treatment. -will follow up recommendations.

## 2014-02-15 NOTE — Assessment & Plan Note (Signed)
-  patient refused Flu shot, will postpone. -will address other health care maintenance issues after she recovers better.

## 2014-02-15 NOTE — Assessment & Plan Note (Signed)
This is secondary to SLE. 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 HD was started via tunneled catheter. A-V graft was placed. She has an appointment with vascular surgeon 3/171/5. Currently she is doing HD on M/W/F, last HD was yesterday. She feels much better now.   -will follow up in one month.  -pain control with Percocet (for the pain over the surgical sites).

## 2014-02-15 NOTE — Patient Instructions (Signed)
1. You have done great job in taking all your medications. I appreciate it very much. Please continue doing that. 2. Please take all medications as prescribed.  3. If you have worsening of your symptoms or new symptoms arise, please call the clinic [phone]), or go to the ER immediately if symptoms are severe.  Please bring in all your medication bottles with you in next visit.

## 2014-02-15 NOTE — Assessment & Plan Note (Signed)
  Assessment: Progress toward smoking cessation:  stopped smoking Barriers to progress toward smoking cessation:    Comments:   Plan: Instruction/counseling given:  I commended patient for quitting and reviewed strategies for preventing relapses. Educational resources provided:  [employer] account [employer]) brochure Self management tools provided:    Medications to assist with smoking cessation:  Nicotine Patch Patient agreed to the following self-care plans for smoking cessation:    Other plans: patient stopped smoking. She is using nicotine patch without complications. Will refill her nicotine patches.

## 2014-02-15 NOTE — Progress Notes (Addendum)
Patient ID: [name], female   DOB: 06/06/1981, 33 y.o.   MRN: [id number] Subjective:   Patient ID: [name] female   DOB: 07-09-1981 33 y.o.   MRN: [id number]  CC:  Hospital followup visit.   HPI:  [name] is a 33 y.o. lady with past medical history as outlined below, who presents for a hospital followup visit today.  [name] is a 33yo woman with history of lupus nephritis untreated for 5 years who presents with ARF and L transudative pleural effusion in the setting of lupus nephritis flare.   Patient was recently hospitalized from 2/2 to 02/08/14 because of systemic lupus erythematous and lupus nephritis flare up. She comes back for hospital followup visit today.  1. SLE and lupus nephritis flare: The patient was diagnosed 6 years ago with lupus nephritis. Her renal biopsy on 12/11/2005 showed combined lupus nephritis with 1 of 21 glomeruli with segmental endocapillary proliferation and cellular crescent formation and lupus membranous glomerulopathy. She was treated for one year with mycophenolate and glucocorticoids before patient decided to stop all treatments. She has not been treated for 5 years. Because of worsening shortness of breath and increased leg edema, patient was a hospitalized on 01/29/14. She was found to have an elevated ESR of 139, low C3 (59) and worsening renal function with Cre up to 13.27 and BUN 77. Patient tested negative for HIV and HCV. UPC of 12, low albumin (1.2), proteinuria (24h Urine protein 715g), acidemia (HCO3- of 9 with anion gap of 20), hyperphosphatemia (phos 10.5), hypocalcemia (Ca 6.0), and anemia (Hgb 7.0). She was diagnosed with SLE and lupus nephritis flare up. She was treated with IV methylprednisolone 1,025m IV for 3 days, then switched to prednisone 616mfor the rest of her hospital stay. Nephrology service was consulted. Patient required hemodialysis and received temporary RRT through tunneled catheter placed on 01/31/2014. Permanent  access was placed on L upper arm on 02/05/2014. Patient received 5 rounds of dialysis (last one on 2/12) before being discharged with outpatient dialysis. 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 After she was discharged, patient has been followed by renal and is doing HD (M/W/F). She is taking tapering dose of prednisone and has been complaint to her medications. She has moderate pain over the tunneled catheter site and left arm graft site, but no fever or chills. Her pain is controlled with Percocet. She feels much better generally. She dose not have SOB, cough, or chest pain. She still has leg edema bilaterally.   2. Hypertension: she was discharged on amlodipine and clonidine. The dose of clonidine was adjusted by her renal doctor on 02/14/14. Currently, she is taking amlodipine 10 mg daily and clonidine 0.1 mg bid. Her bp is 131/89 mmHG. Today.   3. Pleural Effusion: CXR showed significant left pleural effusion but the was not significantly symptomatic on recent admission. She had no pneumonia symptoms. A diagnostic thoracentesis was done on 02/02/2014. Laboratory pleural fluid analysis was consistent with transudative pleural effusion likely due to renal failure. Repeat CXR on 2/10 showed persistent left effusion with patchy consolidation in the left lower lobe, slightly increased from 2/9. This presentation is likely due to re-expansion pulmonary edema.  However, patient never showed symptoms of infection. Incentive spirometry was encouraged. Today, she does not have fever, chills, chest pain, SOB or cough.  She feels much better now.   ROS:  Denies fever,  chills, fatigue, headaches, cough, chest pain, SOB, abdominal pain, diarrhea, constipation, dysuria, urgency, frequency, hematuria, joint pain. She has billaerally leg edema, pain over the surgical sites tunneled catheter over the right  upper chest wall and graft over left arm.    Past Medical History  Diagnosis Date  . Lupus (systemic lupus erythematosus)     Previously followed with Dr. Charlestine Night, has not followed up recently  . Lupus nephritis 2006    Renal biopsy shows segmental endocapillary proliferation and cellular crescent formation (Class IIIA) and lupus membranous glomerulopathy (Class V, stage II)  . S/P pericardiocentesis 01/17/2013    H/o pericardial effusion with tamponade 2006   . H/O pleural effusion 01/17/2013  . H/O pericarditis 01/17/2013  . Polysubstance abuse     cocaine, MJ, tobacco   Current Outpatient Prescriptions  Medication Sig Dispense Refill  . amLODipine (NORVASC) 10 MG tablet Take 1 tablet (10 mg total) by mouth daily.  30 tablet  1  . calcium acetate (PHOSLO) 667 MG capsule Take 1,334 mg by mouth 3 (three) times daily with meals.      . cloNIDine (CATAPRES) 0.2 MG tablet Take 0.1 mg by mouth 2 (two) times daily.      . darbepoetin (ARANESP) 60 MCG/0.3ML SOLN injection Inject 0.3 mLs (60 mcg total) into the vein every Wednesday with hemodialysis.  4.2 mL  0  . diphenhydramine-acetaminophen (TYLENOL PM) 25-500 MG TABS Take 1 tablet by mouth at bedtime as needed (sleep).       . nicotine (NICODERM CQ - DOSED IN MG/24 HR) 7 mg/24hr patch Place 1 patch (7 mg total) onto the skin daily.  28 patch  0  . oxyCODONE-acetaminophen (PERCOCET/ROXICET) 5-325 MG per tablet Take 1 tablet by mouth every 4 (four) hours as needed for severe pain.  30 tablet  0  . predniSONE (DELTASONE) 10 MG tablet Take 30 mg daily with breakfast for three days, then  Take 20 mg daily with breakfast for three days and then  Take 10 mg daily with breakfast for three days and stop  18 tablet  0   No current facility-administered medications for this visit.   No family history on file. History   Social History  . Marital Status: Single    Spouse Name: N/A    Number of Children: N/A  . Years of Education: N/A   Social  History Main Topics  . Smoking status: Current Some Day Smoker -- 0.25 packs/day for 15 years  . Smokeless tobacco: None     Comment: quit smoking s/p hospital discharge  . Alcohol Use: Yes     Comment: "binge drinking" most weekends, per pt report  . Drug Use: 5.00 per week    Special: Marijuana, Cocaine  . Sexual Activity: Yes   Other Topics Concern  . None   Social History Narrative  . None    Review of Systems: Full 14-point review of systems otherwise negative. See HPI.   Objective:  Physical Exam: Filed Vitals:   02/15/14 0907  BP: 131/89  Pulse: 94  Temp: 98.8 F (37.1 C)  TempSrc: Oral  Height: _0  (1.651 m)  Weight: 142 lb 14.4 oz (64.819 kg)  SpO2: 99%   General: alert, cooperative, lying in bed, appears as stated age. HEENT: PERRL, EOMI, conjunctivae without injection. Neck: supple, no lymphadenopathy Lungs: Decreased air movement on the left lower field. There is no rales, wheezing or rubs. There is a tunneled catheter placed over the right upper chest, no  signs of infection.  Heart: regular rate and rhythm, no murmurs, gallops, or rubs Abdomen: soft, non-tender, non-distended, normal bowel sounds, multiple striae present Extremities: 2+ bilateral pitting leg edema bilaterally. There is good bruit over left arm A-V fistula Neurologic: alert & oriented X3, cranial nerves II-XII intact, strength grossly intact, sensation intact to light touch.  Assessment & Plan:   Addendum 02/16/14:  Patient CXR showed that her L pleural effusion increased slightly. Currently patient is asymptomatic. She does not have cough, chest pain, shortness of breath, fever or chills. Will followup in next visit.  Ivor Costa, MD PGY3, Internal Medicine Teaching Service Pager: [phone]

## 2014-02-15 NOTE — Assessment & Plan Note (Signed)
Patient had transudative pleural effusion, s/sp of diagnostic thoracenteses.  She dose not have, chills, chest pain, cough or SOB.  O2 Sat is 99% on RA. But she has decreased air movement on the left lower field. No signs of infection.  -will repeat CXR today. -encouraged to use incentive spirometry.

## 2014-02-16 ENCOUNTER — Ambulatory Visit (HOSPITAL_COMMUNITY)
Admission: RE | Admit: 2014-02-16 | Discharge: 2014-02-16 | Disposition: A | Discharge status: Home / Self Care | Payer: Medicaid Other | Source: Ambulatory Visit | Attending: Internal Medicine | Admitting: Internal Medicine

## 2014-02-16 DIAGNOSIS — J90 Pleural effusion, not elsewhere classified: Secondary | ICD-10-CM | POA: Insufficient documentation

## 2014-02-16 NOTE — Progress Notes (Signed)
Case discussed with Dr. Blaine Hamper at the time of the visit.  We reviewed the resident's history and exam and pertinent patient test results.  I agree with the assessment, diagnosis, and plan of care documented in the resident's note.

## 2014-02-27 ENCOUNTER — Ambulatory Visit
Admission: RE | Admit: 2014-02-27 | Discharge: 2014-02-27 | Disposition: A | Discharge status: Home / Self Care | Payer: Medicaid Other | Source: Ambulatory Visit | Attending: Nephrology | Admitting: Nephrology

## 2014-02-27 ENCOUNTER — Other Ambulatory Visit: Payer: Self-pay | Admitting: Nephrology

## 2014-02-27 DIAGNOSIS — J90 Pleural effusion, not elsewhere classified: Secondary | ICD-10-CM

## 2014-03-12 ENCOUNTER — Encounter: Payer: Self-pay | Admitting: Vascular Surgery

## 2014-03-13 ENCOUNTER — Encounter: Payer: Medicaid Other | Admitting: Vascular Surgery

## 2014-03-13 ENCOUNTER — Ambulatory Visit (HOSPITAL_COMMUNITY)
Admission: RE | Admit: 2014-03-13 | Discharge: 2014-03-13 | Disposition: A | Discharge status: Home / Self Care | Payer: Medicaid Other | Source: Ambulatory Visit | Attending: Vascular Surgery | Admitting: Vascular Surgery

## 2014-03-13 DIAGNOSIS — N186 End stage renal disease: Secondary | ICD-10-CM | POA: Insufficient documentation

## 2014-03-13 DIAGNOSIS — Z4931 Encounter for adequacy testing for hemodialysis: Secondary | ICD-10-CM

## 2014-03-15 ENCOUNTER — Encounter: Payer: Self-pay | Admitting: Internal Medicine

## 2014-03-15 ENCOUNTER — Ambulatory Visit (INDEPENDENT_AMBULATORY_CARE_PROVIDER_SITE_OTHER): Payer: Medicaid Other | Admitting: Internal Medicine

## 2014-03-15 VITALS — BP 141/104 | HR 85 | Temp 99.0°F | Ht 65.0 in | Wt 116.9 lb

## 2014-03-15 DIAGNOSIS — Z992 Dependence on renal dialysis: Secondary | ICD-10-CM

## 2014-03-15 DIAGNOSIS — M79602 Pain in left arm: Secondary | ICD-10-CM

## 2014-03-15 DIAGNOSIS — I120 Hypertensive chronic kidney disease with stage 5 chronic kidney disease or end stage renal disease: Secondary | ICD-10-CM | POA: Insufficient documentation

## 2014-03-15 DIAGNOSIS — M3214 Glomerular disease in systemic lupus erythematosus: Secondary | ICD-10-CM

## 2014-03-15 DIAGNOSIS — N186 End stage renal disease: Secondary | ICD-10-CM

## 2014-03-15 DIAGNOSIS — G4700 Insomnia, unspecified: Secondary | ICD-10-CM | POA: Insufficient documentation

## 2014-03-15 MED ORDER — AMLODIPINE BESYLATE 10 MG PO TABS: 10.0000 mg | ORAL_TABLET | Freq: Every day | ORAL | Status: DC

## 2014-03-15 MED ORDER — OXYCODONE-ACETAMINOPHEN 5-325 MG PO TABS: 1.0000 | ORAL_TABLET | ORAL | Status: DC | PRN

## 2014-03-15 MED ORDER — DIPHENHYDRAMINE HCL (SLEEP) 50 MG PO TABS: 50.0000 mg | ORAL_TABLET | Freq: Every evening | ORAL | Status: DC | PRN

## 2014-03-15 NOTE — Patient Instructions (Addendum)
General Instructions:  Continue your Hemodialysis as scheduled Although your blood pressure is high, we will not change you blood pressure medications today since you have not taken your medication this morning. We will refill your amlodipine and Percocet. Be sure to take your medications prior to your next visit. Keep your follow-up appointments with the Vascular Surgeon. We will try to get an appointment with a Rheumatologist for you.  Please bring your medicines with you each time you come.   Medicines may be  Eye drops  Herbal   Vitamins  Pills  Seeing these help [name] take care of you.  Treatment Goals:  Goals (1 Years of Data) as of 03/15/14   None      Progress Toward Treatment Goals:  Treatment Goal 03/15/2014  Stop smoking smoking less    Self Care Goals & Plans:  Self Care Goal 02/15/2014  Manage my medications take my medicines as prescribed; refill my medications on time  Monitor my health keep track of my blood pressure  Eat healthy foods eat foods that are low in salt; eat baked foods instead of fried foods  Be physically active park at the far end of the parking lot    No flowsheet data found.   Care Management & Community Referrals:  No flowsheet data found.

## 2014-03-15 NOTE — Progress Notes (Signed)
   Subjective:    Patient ID: [name], female    DOB: 03-31-1981, 31 y.o.   MRN: [id number]  HPI  Pt with hx significant for SLE with lupus nephritis on HD MWF.  She is due for HD tomorrow and has been compliant with her schedule.  Of note, she has not taken he bp meds as of yet today and states that she usually takes them at "noon".  Bp today 141/104 pulse 85 bpm. Regimen consists of amlodipine 10 mg and clonidine 0.1 mg bid (states that kidney doctors have her taking 1/2 of the 0.2 mg clonidine pill).  Has followed up with the Vascular surgeon with plans to use the revised graft in another month for HD.  States that she still has some pain of the left arm at site of graft. Denies chest pain or sob.   Review of Systems  Constitutional: Negative for fever, chills and fatigue.       Thinks 116 is her dry weight, Dialysis hasnt been pulling off anymore fluid since admission to hospital  Respiratory: Negative for shortness of breath and wheezing.   Cardiovascular: Negative for chest pain.  Gastrointestinal: Negative for abdominal pain, diarrhea and blood in stool.  Genitourinary: Negative for dysuria, hematuria and pelvic pain.       Uses [facility])  Musculoskeletal: Negative.   Neurological: Positive for dizziness.       Dizzy for an hour after dialysis  Psychiatric/Behavioral: Negative.        Objective:   Physical Exam  Constitutional: She is oriented to person, place, and time. She appears well-developed and well-nourished. No distress.  HENT:  Head: Normocephalic and atraumatic.  Eyes: Conjunctivae and EOM are normal. Pupils are equal, round, and reactive to light.  Neck: Normal range of motion.  Cardiovascular: Normal rate, regular rhythm, normal heart sounds and intact distal pulses.   Pulmonary/Chest: Effort normal and breath sounds normal.  Abdominal: Soft. Bowel sounds are normal.  Musculoskeletal: Normal range of motion. She exhibits no edema and no  tenderness.  Neurological: She is alert and oriented to person, place, and time.  Skin: Skin is warm and dry.  Psychiatric: She has a normal mood and affect.          Assessment & Plan:  See separate problem-list charting:

## 2014-03-19 ENCOUNTER — Encounter: Payer: Self-pay | Admitting: Vascular Surgery

## 2014-03-20 ENCOUNTER — Encounter: Payer: Medicaid Other | Admitting: Vascular Surgery

## 2014-03-20 NOTE — Assessment & Plan Note (Signed)
Compliant with schedule.  Dry weight ~112. No signs of fluid overload today.  Bp is elevated  141/104.  Pt has not had meds today and is due for HD tomorrow. -cont current regimen amlodipine 10 mg qd and clonidine 0.1 bid per Renal.

## 2014-03-20 NOTE — Assessment & Plan Note (Signed)
BP Readings from Last 3 Encounters:  03/15/14 141/104  02/15/14 131/89  02/08/14 120/77    Lab Results  Component Value Date   NA 136* 02/08/2014   K 4.5 02/08/2014   CREATININE 6.29* 02/08/2014    Assessment: Blood pressure control:   Progress toward BP goal:    Comments: hasnt had meds today  Plan: Medications:  continue current medications Educational resources provided:   Self management tools provided:   Other plans: cont amlodipine 10 mg qd and clonidine 0.1 mg bid

## 2014-03-20 NOTE — Assessment & Plan Note (Signed)
Reports difficulty sleeping especially on dialysis days. -Recommend oral Benadryl prn

## 2014-03-21 NOTE — Progress Notes (Signed)
Case discussed with Dr. Schooler soon after the resident saw the patient.  We reviewed the resident's history and exam and pertinent patient test results.  I agree with the assessment, diagnosis, and plan of care documented in the resident's note. 

## 2014-03-22 DIAGNOSIS — E441 Mild protein-calorie malnutrition: Secondary | ICD-10-CM | POA: Insufficient documentation

## 2014-03-26 ENCOUNTER — Encounter: Payer: Self-pay | Admitting: Vascular Surgery

## 2014-03-27 ENCOUNTER — Ambulatory Visit (INDEPENDENT_AMBULATORY_CARE_PROVIDER_SITE_OTHER): Payer: Medicaid Other | Admitting: Vascular Surgery

## 2014-03-27 ENCOUNTER — Encounter: Payer: Self-pay | Admitting: Vascular Surgery

## 2014-03-27 VITALS — BP 158/108 | HR 99 | Resp 14 | Ht 64.0 in | Wt 120.6 lb

## 2014-03-27 DIAGNOSIS — N186 End stage renal disease: Secondary | ICD-10-CM

## 2014-03-27 NOTE — Progress Notes (Signed)
Here today for followup of left arm basilic vein transposition by myself 1 02/05/2014. She has had no difficulty with her catheter which was placed several days prior to this.  On physical exam her surgical incisions are all well healed. She has excellent maturation of her basilic vein transposition fistula with good size development and also excellent thrill throughout the fistula. She does have palpable distal left radial pulse.  Impression and plan: A good [name] maturation of a celiac vein transposition fistula. Would recommend waiting 3 months for maturation prior to accessing this. Should be able to use this in [name]. She will see [name] again on an as-needed basis

## 2014-04-19 ENCOUNTER — Encounter: Payer: Self-pay | Admitting: Internal Medicine

## 2014-04-19 ENCOUNTER — Ambulatory Visit (INDEPENDENT_AMBULATORY_CARE_PROVIDER_SITE_OTHER): Payer: Medicaid Other | Admitting: Internal Medicine

## 2014-04-19 VITALS — BP 168/111 | HR 86 | Temp 98.2°F | Ht 65.0 in | Wt 115.5 lb

## 2014-04-19 DIAGNOSIS — M329 Systemic lupus erythematosus, unspecified: Secondary | ICD-10-CM

## 2014-04-19 DIAGNOSIS — R252 Cramp and spasm: Secondary | ICD-10-CM

## 2014-04-19 DIAGNOSIS — Z309 Encounter for contraceptive management, unspecified: Secondary | ICD-10-CM | POA: Insufficient documentation

## 2014-04-19 DIAGNOSIS — F17200 Nicotine dependence, unspecified, uncomplicated: Secondary | ICD-10-CM

## 2014-04-19 DIAGNOSIS — I120 Hypertensive chronic kidney disease with stage 5 chronic kidney disease or end stage renal disease: Secondary | ICD-10-CM

## 2014-04-19 DIAGNOSIS — Z720 Tobacco use: Secondary | ICD-10-CM

## 2014-04-19 DIAGNOSIS — D509 Iron deficiency anemia, unspecified: Secondary | ICD-10-CM

## 2014-04-19 DIAGNOSIS — N186 End stage renal disease: Secondary | ICD-10-CM

## 2014-04-19 LAB — CBC
HCT: 38.6 % (ref 36.0–46.0)
HEMOGLOBIN: 12 g/dL (ref 12.0–15.0)
MCH: 25.8 pg — AB (ref 26.0–34.0)
MCHC: 31.1 g/dL (ref 30.0–36.0)
MCV: 83 fL (ref 78.0–100.0)
PLATELETS: 145 10*3/uL — AB (ref 150–400)
RBC: 4.65 MIL/uL (ref 3.87–5.11)
RDW: 16.1 % — ABNORMAL HIGH (ref 11.5–15.5)
WBC: 5.3 10*3/uL (ref 4.0–10.5)

## 2014-04-19 MED ORDER — LISINOPRIL 10 MG PO TABS: 10.0000 mg | ORAL_TABLET | Freq: Every day | ORAL | Status: AC

## 2014-04-19 NOTE — Progress Notes (Signed)
Case discussed with Dr. Hoffman at the time of the visit.  We reviewed the resident's history and exam and pertinent patient test results.  I agree with the assessment, diagnosis, and plan of care documented in the resident's note. 

## 2014-04-19 NOTE — Patient Instructions (Addendum)
Please start taking Lisinopril 10mg  each night, stop taking clonidine.  Dr. Darnell Level will adjust this medication. Please use contraception devices until you have the IUD placed.

## 2014-04-19 NOTE — Progress Notes (Signed)
[facility] Subjective:   Patient ID: [name] female   DOB: January 24, 1981 33 y.o.   MRN: [id number]  HPI: [name] is a 32 y.o. female with a PMH significant for SLE, Lupis Nephritis on HD MWF and substance abuse. She presents today for regular follow up she reports dialysis has been going well.  She plans to start using her left fistula on Monday.  She saw Dr. Moshe Cipro (nephrology) yesterday. Her only complaint today is occasional cramping on hands and occasionally toes.  This typically happens at night.  She does note this started after she finished her prednisone taper. She has discussed this with Dr. Moshe Cipro but is unsure if it is dialysis or SLE related.  She has been unable to follow up with Dr. Charlestine Night due to history of missed appointments.   Of note she reports she is sexually active but uses condoms for birth control.  She does not want to have any further children at this time and is willing to have long term birth control.   Past Medical History  Diagnosis Date  . Lupus (systemic lupus erythematosus)     Previously followed with Dr. Charlestine Night, has not followed up recently  . Lupus nephritis 2006    Renal biopsy shows segmental endocapillary proliferation and cellular crescent formation (Class IIIA) and lupus membranous glomerulopathy (Class V, stage II)  . S/P pericardiocentesis 01/17/2013    H/o pericardial effusion with tamponade 2006   . H/O pleural effusion 01/17/2013  . H/O pericarditis 01/17/2013  . Polysubstance abuse     cocaine, MJ, tobacco   Current Outpatient Prescriptions  Medication Sig Dispense Refill  . amLODipine (NORVASC) 10 MG tablet Take 1 tablet (10 mg total) by mouth daily.  30 tablet  6  . calcium acetate (PHOSLO) 667 MG capsule Take 1,334 mg by mouth 3 (three) times daily with meals.      . cloNIDine (CATAPRES) 0.2 MG tablet Take 0.1 mg by mouth 2 (two) times daily.      . darbepoetin (ARANESP) 60 MCG/0.3ML SOLN  injection Inject 0.3 mLs (60 mcg total) into the vein every Wednesday with hemodialysis.  4.2 mL  0  . DiphenhydrAMINE HCl, Sleep, 50 MG tablet Take 1 tablet (50 mg total) by mouth at bedtime as needed for sleep.  30 tablet  0  . diphenhydramine-acetaminophen (TYLENOL PM) 25-500 MG TABS Take 1 tablet by mouth at bedtime as needed (sleep).       . nicotine (NICODERM CQ - DOSED IN MG/24 HR) 7 mg/24hr patch Place 1 patch (7 mg total) onto the skin daily.  28 patch  1  . oxyCODONE-acetaminophen (PERCOCET/ROXICET) 5-325 MG per tablet Take 1 tablet by mouth every 4 (four) hours as needed for severe pain.  45 tablet  0  . predniSONE (DELTASONE) 10 MG tablet Take 30 mg daily with breakfast for three days, then  Take 20 mg daily with breakfast for three days and then  Take 10 mg daily with breakfast for three days and stop  18 tablet  0   No current facility-administered medications for this visit.   No family history on file. History   Social History  . Marital Status: Single    Spouse Name: N/A    Number of Children: N/A  . Years of Education: N/A   Social History Main Topics  . Smoking status: Current Some Day Smoker -- 0.25 packs/day for 15 years  . Smokeless tobacco: Never Used  Comment: quit smoking s/p hospital discharge  . Alcohol Use: Yes     Comment: "binge drinking" most weekends, per pt report  . Drug Use: 5.00 per week    Special: Marijuana, Cocaine  . Sexual Activity: Yes   Other Topics Concern  . Not on file   Social History Narrative  . No narrative on file   Review of Systems: Review of Systems  Constitutional: Negative for fever, chills, weight loss and malaise/fatigue.  Eyes: Negative for blurred vision.  Respiratory: Negative for cough.   Cardiovascular: Negative for chest pain.  Gastrointestinal: Negative for nausea, vomiting and abdominal pain.  Genitourinary: Negative for dysuria.  Musculoskeletal: Positive for joint pain (cramping in hands).  Skin:  Negative for itching.  Neurological: Negative for dizziness, weakness and headaches.  Psychiatric/Behavioral: Negative for depression and substance abuse.     Objective:  Physical Exam: Filed Vitals:   04/19/14 1338  BP: 168/111  Pulse: 86  Temp: 98.2 F (36.8 C)  TempSrc: Oral  Height: 5\' 5"  (1.651 m)  Weight: 115 lb 8 oz (52.39 kg)  SpO2: 100%   Physical Exam  Nursing note and vitals reviewed. Constitutional: She is oriented to person, place, and time and well-developed, well-nourished, and in no distress.  HENT:  Head: Normocephalic and atraumatic.  Eyes: EOM are normal. Pupils are equal, round, and reactive to light.  Cardiovascular: Normal rate, regular rhythm and intact distal pulses.   No murmur heard. + bruit + thrill in LUE  Pulmonary/Chest: Effort normal. No respiratory distress. She has no wheezes.  Abdominal: Soft. Bowel sounds are normal. There is no tenderness. There is no rebound.  Musculoskeletal: She exhibits no edema.       Right hand: She exhibits normal range of motion, no tenderness, no bony tenderness and no swelling.       Left hand: She exhibits normal range of motion, no tenderness, no bony tenderness and no swelling.  Neurological: She is alert and oriented to person, place, and time.  Skin: Skin is warm and dry.  Psychiatric: Mood, memory, affect and judgment normal.     Assessment & Plan:  Case discussed with Dr. Stann Mainland See Problem Based Assessment and Plan Medications Ordered Meds ordered this encounter  Medications  . lisinopril (PRINIVIL,ZESTRIL) 10 MG tablet    Sig: Take 1 tablet (10 mg total) by mouth daily.    Dispense:  30 tablet    Refill:  5   Other Orders Orders Placed This Encounter  Procedures  . CBC no Diff  . Ambulatory referral to Gynecology    Referral Priority:  Routine    Referral Type:  Consultation    Referral Reason:  Specialty Services Required    Requested Specialty:  Gynecology    Number of Visits Requested:  1

## 2014-04-22 ENCOUNTER — Encounter: Payer: Self-pay | Admitting: Internal Medicine

## 2014-04-22 NOTE — Assessment & Plan Note (Signed)
Referral to GYN for IUD placement.

## 2014-04-22 NOTE — Assessment & Plan Note (Signed)
-  Repeat CBC CBC    Component Value Date/Time   WBC 5.3 04/19/2014 1439   RBC 4.65 04/19/2014 1439   RBC 2.68* 01/31/2014 0536   HGB 12.0 04/19/2014 1439   HCT 38.6 04/19/2014 1439   PLT 145* 04/19/2014 1439   MCV 83.0 04/19/2014 1439   MCH 25.8* 04/19/2014 1439   MCHC 31.1 04/19/2014 1439   RDW 16.1* 04/19/2014 1439   LYMPHSABS 0.6* 01/30/2014 0616   MONOABS 0.1 01/30/2014 0616   EOSABS 0.1 01/30/2014 0616   BASOSABS 0.0 01/30/2014 0616  -Patient's Hgb is now 12.

## 2014-04-22 NOTE — Assessment & Plan Note (Addendum)
BP Readings from Last 3 Encounters:  04/19/14 168/111  03/27/14 158/108  03/15/14 141/104    Lab Results  Component Value Date   NA 136* 02/08/2014   K 4.5 02/08/2014   CREATININE 6.29* 02/08/2014    Assessment: Blood pressure control:  uncontrolled Progress toward BP goal:   deteriorated Comments: Patient currently taking Amlodipine 10mg  daily and clonidine 0.05mg  QHS  Plan: Medications:  Amlodipine 10mg  daily, Lisinopril 10mg  daily Educational resources provided:   Self management tools provided:   Other plans: I spoke with her Nephrologist Dr. Moshe Cipro, we will have patient stop clonidine and start Lisinopril 10mg  daily, Dr. Moshe Cipro will titrate this medication up as necessary.  Patient was also informed of the teratogenic effects of lisinopril and reported she did not want any more children at this time and she was also advised this would be difficult to get pregnant given ESRD and Lupus. She is willing to be referred to GYN for placement of IUD.

## 2014-04-22 NOTE — Assessment & Plan Note (Signed)
  Assessment: Progress toward smoking cessation:   cutting down Barriers to progress toward smoking cessation:   motivation Comments:  Plan: Instruction/counseling given:  I counseled patient on the dangers of tobacco use, advised patient to stop smoking, and reviewed strategies to maximize success. Educational resources provided:    Self management tools provided:    Medications to assist with smoking cessation:  None Patient agreed to the following self-care plans for smoking cessation:    Other plans:

## 2014-04-22 NOTE — Assessment & Plan Note (Signed)
-  Patient unable to reestablish with Dr. Charlestine Night - Will try to refer her to other rheumatologist in our area. - Her CBC today shows leukocytosis and anemia have resolved. - At next visit consider checking CBC, ESR, CRP, U/A, Spot micro/cr, anti dsDNA, complement C3 and C4 to monitor for disease activity.

## 2014-04-22 NOTE — Assessment & Plan Note (Signed)
-  Given her close monitoring of electrolytes in dialysis this pain may reflect a mild lupus flare.  She is unable to reestablish with Dr. Elmon Else office but we will try to refer her to another rheumatologist for chronic management of her lupus.  At this time given her mild symptoms will not prescribe further medications and continue to monitor for now.

## 2014-04-24 ENCOUNTER — Encounter: Payer: Self-pay | Admitting: Obstetrics and Gynecology

## 2014-04-30 DIAGNOSIS — R52 Pain, unspecified: Secondary | ICD-10-CM | POA: Insufficient documentation

## 2014-04-30 DIAGNOSIS — R519 Headache, unspecified: Secondary | ICD-10-CM | POA: Insufficient documentation

## 2014-05-15 ENCOUNTER — Encounter (HOSPITAL_COMMUNITY): Payer: Self-pay | Admitting: Emergency Medicine

## 2014-05-15 ENCOUNTER — Emergency Department (HOSPITAL_COMMUNITY)
Admission: EM | Admit: 2014-05-15 | Discharge: 2014-05-15 | Disposition: A | Discharge status: Home / Self Care | Payer: Medicaid Other | Attending: Emergency Medicine | Admitting: Emergency Medicine

## 2014-05-15 DIAGNOSIS — M329 Systemic lupus erythematosus, unspecified: Secondary | ICD-10-CM | POA: Insufficient documentation

## 2014-05-15 DIAGNOSIS — Z8709 Personal history of other diseases of the respiratory system: Secondary | ICD-10-CM | POA: Insufficient documentation

## 2014-05-15 DIAGNOSIS — Z79899 Other long term (current) drug therapy: Secondary | ICD-10-CM | POA: Insufficient documentation

## 2014-05-15 DIAGNOSIS — Z9889 Other specified postprocedural states: Secondary | ICD-10-CM | POA: Insufficient documentation

## 2014-05-15 DIAGNOSIS — N186 End stage renal disease: Secondary | ICD-10-CM | POA: Insufficient documentation

## 2014-05-15 DIAGNOSIS — R197 Diarrhea, unspecified: Secondary | ICD-10-CM | POA: Insufficient documentation

## 2014-05-15 DIAGNOSIS — Z992 Dependence on renal dialysis: Secondary | ICD-10-CM | POA: Insufficient documentation

## 2014-05-15 DIAGNOSIS — Z8679 Personal history of other diseases of the circulatory system: Secondary | ICD-10-CM | POA: Insufficient documentation

## 2014-05-15 DIAGNOSIS — F17200 Nicotine dependence, unspecified, uncomplicated: Secondary | ICD-10-CM | POA: Insufficient documentation

## 2014-05-15 DIAGNOSIS — N058 Unspecified nephritic syndrome with other morphologic changes: Secondary | ICD-10-CM | POA: Insufficient documentation

## 2014-05-15 LAB — BASIC METABOLIC PANEL
BUN: 63 mg/dL — ABNORMAL HIGH (ref 6–23)
CALCIUM: 7.4 mg/dL — AB (ref 8.4–10.5)
CHLORIDE: 101 meq/L (ref 96–112)
CO2: 22 meq/L (ref 19–32)
CREATININE: 11.75 mg/dL — AB (ref 0.50–1.10)
GFR calc Af Amer: 4 mL/min — ABNORMAL LOW (ref >90)
GFR calc non Af Amer: 4 mL/min — ABNORMAL LOW (ref >90)
GLUCOSE: 53 mg/dL — AB (ref 70–99)
Potassium: 4 mEq/L (ref 3.7–5.3)
Sodium: 140 mEq/L (ref 137–147)

## 2014-05-15 LAB — CBC
HEMATOCRIT: 34.3 % — AB (ref 36.0–46.0)
Hemoglobin: 10.6 g/dL — ABNORMAL LOW (ref 12.0–15.0)
MCH: 26.6 pg (ref 26.0–34.0)
MCHC: 30.9 g/dL (ref 30.0–36.0)
MCV: 86 fL (ref 78.0–100.0)
Platelets: 203 10*3/uL (ref 150–400)
RBC: 3.99 MIL/uL (ref 3.87–5.11)
RDW: 15.5 % (ref 11.5–15.5)
WBC: 5.3 10*3/uL (ref 4.0–10.5)

## 2014-05-15 LAB — CLOSTRIDIUM DIFFICILE BY PCR: Toxigenic C. Difficile by PCR: NEGATIVE

## 2014-05-15 NOTE — ED Provider Notes (Signed)
CSN: [id number]     Arrival date & time 05/15/14  [id number] History   First MD Initiated Contact with Patient 05/15/14 [phone]     Chief Complaint  Patient presents with  . 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  She's been on dialysis for 2 weeks.  She presents now with diarrhea over the past 2 days.  She states it's been watery.  She states there's been traced episodes of blood in it.  She has similar symptoms one week ago that resolved on its own.  No recent travel.  No recent antibiotics.  She is continue dialysis as scheduled however missed her last dialysis yesterday secondary to diarrhea.  She feels as though she's under her dry weight.  She denies weakness or lightheadedness when she stands up.  Denies chest pain shortness of breath.  No abdominal pain.  No nausea or vomiting.  No fevers or chills.  No recent sick contacts.  Symptoms are mild in severity   Past Medical History  Diagnosis Date  . Lupus (systemic lupus erythematosus)     Previously followed with Dr. Charlestine Night, has not followed up recently  . Lupus nephritis 2006    Renal biopsy shows segmental endocapillary proliferation and cellular crescent formation (Class IIIA) and lupus membranous glomerulopathy (Class V, stage II)  . S/P pericardiocentesis 01/17/2013    H/o pericardial effusion with tamponade 2006   . H/O pleural effusion 01/17/2013  . H/O pericarditis 01/17/2013  . Polysubstance abuse     cocaine, MJ, tobacco   Past Surgical History  Procedure Laterality Date  . Bascilic vein transposition Left 02/05/2014    Procedure: [place];  Surgeon: [name], MD;  Location: [place];  Service: Vascular;  Laterality: Left;   No family history on file. History  Substance Use Topics  . Smoking status: Current Some Day Smoker -- 0.25 packs/day for 15 years  . Smokeless tobacco: Never Used     Comment: quit smoking s/p hospital discharge/ SMOKES ABOUT 2-3  CIGARETTES A DAY  . Alcohol Use: Yes     Comment: "binge drinking" most weekends, per pt report   OB History   Grav Para Term Preterm Abortions TAB SAB Ect Mult Living                 Review of Systems  All other systems reviewed and are negative.     Allergies  Tobramycin sulfate  Home Medications   Prior to Admission medications   Medication Sig Start Date End Date Taking? Authorizing Provider  amLODipine (NORVASC) 10 MG tablet Take 1 tablet (10 mg total) by mouth daily. 03/15/14  Yes Jeralene Huff, MD  calcium acetate (PHOSLO) 667 MG capsule Take 1,334 mg by mouth 3 (three) times daily with meals.   Yes Historical Provider, MD  diphenhydramine-acetaminophen (TYLENOL PM) 25-500 MG TABS Take 1 tablet by mouth at bedtime as needed (sleep).    Yes Historical Provider, MD  lisinopril (PRINIVIL,ZESTRIL) 10 MG tablet Take 1 tablet (10 mg total) by mouth daily. 04/19/14 04/19/15 Yes Joni Reining, DO  darbepoetin (ARANESP) 60 MCG/0.3ML SOLN injection Inject 0.3 mLs (60 mcg total) into the vein every Wednesday with hemodialysis. 02/08/14   Jessee Avers, MD   BP 166/97  Pulse 75  Temp(Src) 97.9 F (36.6 C) (Oral)  Resp 16  Ht 5\' 4"  (1.626 m)  Wt 115 lb 9.6 oz (52.436 kg)  BMI 19.83  kg/m2  SpO2 100% Physical Exam  Nursing note and vitals reviewed. Constitutional: She is oriented to person, place, and time. She appears well-developed and well-nourished. No distress.  HENT:  Head: Normocephalic and atraumatic.  Eyes: EOM are normal.  Neck: Normal range of motion.  Cardiovascular: Normal rate, regular rhythm and normal heart sounds.   Pulmonary/Chest: Effort normal and breath sounds normal.  Abdominal: Soft. She exhibits no distension. There is no tenderness.  Musculoskeletal: Normal range of motion.  Neurological: She is alert and oriented to person, place, and time.  Skin: Skin is warm and dry.  Psychiatric: She has a normal mood and affect. Judgment normal.    ED  Course  Procedures (including critical care time) Labs Review Labs Reviewed  BASIC METABOLIC PANEL - Abnormal; Notable for the following:    Glucose, Bld 53 (*)    BUN 63 (*)    Creatinine, Ser 11.75 (*)    Calcium 7.4 (*)    GFR calc non Af Amer 4 (*)    GFR calc Af Amer 4 (*)    All other components within normal limits  CBC - Abnormal; Notable for the following:    Hemoglobin 10.6 (*)    HCT 34.3 (*)    All other components within normal limits  CLOSTRIDIUM DIFFICILE BY PCR  STOOL CULTURE    Imaging Review No results found.   EKG Interpretation None      MDM   Final diagnoses:  Diarrhea   , Exam benign.  Electrolytes okay.  Patient is scheduled for dialysis later today.  She'll be discharged home at this time.  I do not think that she needs IV hydration.  I've asked that she talk with her dialysis team today so they can appropriately dialyze her and take into account her likely volume depletion.  Outpatient GI followup.    Hoy Morn, MD 05/15/14 [phone]

## 2014-05-15 NOTE — ED Notes (Signed)
Patient reports having diarrhea x2 days. Patient reports having 10 episodes of watery stools in the last 24 hours. Patient is also dialysis patient who has not been dialyzed since Friday. Patient has appt today at 11am for dialysis. Denies abd pain, denies sob. Denies fevers. Denies taking any recent anti-biotics.

## 2014-05-19 LAB — STOOL CULTURE

## 2014-06-06 ENCOUNTER — Encounter: Payer: Medicaid Other | Admitting: Obstetrics and Gynecology

## 2014-06-12 NOTE — Addendum Note (Signed)
Addended by: Hulan Fray on: 06/12/2014 09:58 PM   Modules accepted: Orders

## 2014-08-28 DIAGNOSIS — L299 Pruritus, unspecified: Secondary | ICD-10-CM | POA: Insufficient documentation

## 2014-08-28 DIAGNOSIS — R509 Fever, unspecified: Secondary | ICD-10-CM | POA: Insufficient documentation

## 2014-10-11 ENCOUNTER — Encounter: Payer: Self-pay | Admitting: Internal Medicine

## 2014-10-11 ENCOUNTER — Encounter: Payer: Medicaid Other | Admitting: Internal Medicine

## 2014-10-16 DIAGNOSIS — N051 Unspecified nephritic syndrome with focal and segmental glomerular lesions: Secondary | ICD-10-CM | POA: Insufficient documentation

## 2014-11-03 ENCOUNTER — Emergency Department (HOSPITAL_COMMUNITY): Payer: Medicaid Other

## 2014-11-03 ENCOUNTER — Encounter (HOSPITAL_COMMUNITY): Payer: Self-pay | Admitting: *Deleted

## 2014-11-03 ENCOUNTER — Emergency Department (HOSPITAL_COMMUNITY)
Admission: EM | Admit: 2014-11-03 | Discharge: 2014-11-03 | Disposition: A | Discharge status: Home / Self Care | Payer: Medicaid Other | Attending: Emergency Medicine | Admitting: Emergency Medicine

## 2014-11-03 DIAGNOSIS — R1031 Right lower quadrant pain: Secondary | ICD-10-CM

## 2014-11-03 DIAGNOSIS — Z720 Tobacco use: Secondary | ICD-10-CM | POA: Insufficient documentation

## 2014-11-03 DIAGNOSIS — Z79899 Other long term (current) drug therapy: Secondary | ICD-10-CM | POA: Insufficient documentation

## 2014-11-03 DIAGNOSIS — R1030 Lower abdominal pain, unspecified: Secondary | ICD-10-CM | POA: Diagnosis not present

## 2014-11-03 DIAGNOSIS — Z8739 Personal history of other diseases of the musculoskeletal system and connective tissue: Secondary | ICD-10-CM | POA: Diagnosis not present

## 2014-11-03 DIAGNOSIS — Z8679 Personal history of other diseases of the circulatory system: Secondary | ICD-10-CM | POA: Diagnosis not present

## 2014-11-03 DIAGNOSIS — M25511 Pain in right shoulder: Secondary | ICD-10-CM

## 2014-11-03 DIAGNOSIS — M79604 Pain in right leg: Secondary | ICD-10-CM | POA: Diagnosis present

## 2014-11-03 LAB — COMPREHENSIVE METABOLIC PANEL
ALK PHOS: 67 U/L (ref 39–117)
ALT: 10 U/L (ref 0–35)
AST: 13 U/L (ref 0–37)
Albumin: 2.4 g/dL — ABNORMAL LOW (ref 3.5–5.2)
Anion gap: 18 — ABNORMAL HIGH (ref 5–15)
BUN: 80 mg/dL — ABNORMAL HIGH (ref 6–23)
CHLORIDE: 96 meq/L (ref 96–112)
CO2: 25 meq/L (ref 19–32)
Calcium: 8.2 mg/dL — ABNORMAL LOW (ref 8.4–10.5)
Creatinine, Ser: 13.52 mg/dL — ABNORMAL HIGH (ref 0.50–1.10)
GFR calc Af Amer: 4 mL/min — ABNORMAL LOW (ref >90)
GFR, EST NON AFRICAN AMERICAN: 3 mL/min — AB (ref >90)
Glucose, Bld: 71 mg/dL (ref 70–99)
POTASSIUM: 5.1 meq/L (ref 3.7–5.3)
SODIUM: 139 meq/L (ref 137–147)
Total Bilirubin: 0.2 mg/dL — ABNORMAL LOW (ref 0.3–1.2)
Total Protein: 8 g/dL (ref 6.0–8.3)

## 2014-11-03 LAB — CBC WITH DIFFERENTIAL/PLATELET
BASOS PCT: 0 % (ref 0–1)
Basophils Absolute: 0 10*3/uL (ref 0.0–0.1)
EOS PCT: 3 % (ref 0–5)
Eosinophils Absolute: 0.1 10*3/uL (ref 0.0–0.7)
HCT: 34.1 % — ABNORMAL LOW (ref 36.0–46.0)
HEMOGLOBIN: 10.2 g/dL — AB (ref 12.0–15.0)
LYMPHS PCT: 42 % (ref 12–46)
Lymphs Abs: 1 10*3/uL (ref 0.7–4.0)
MCH: 26.7 pg (ref 26.0–34.0)
MCHC: 29.9 g/dL — ABNORMAL LOW (ref 30.0–36.0)
MCV: 89.3 fL (ref 78.0–100.0)
MONOS PCT: 22 % — AB (ref 3–12)
Monocytes Absolute: 0.6 10*3/uL (ref 0.1–1.0)
NEUTROS PCT: 33 % — AB (ref 43–77)
Neutro Abs: 0.9 10*3/uL — ABNORMAL LOW (ref 1.7–7.7)
Platelets: 171 10*3/uL (ref 150–400)
RBC: 3.82 MIL/uL — AB (ref 3.87–5.11)
RDW: 19.9 % — ABNORMAL HIGH (ref 11.5–15.5)
WBC: 2.6 10*3/uL — AB (ref 4.0–10.5)

## 2014-11-03 LAB — PRO B NATRIURETIC PEPTIDE: Pro B Natriuretic peptide (BNP): 37602 pg/mL — ABNORMAL HIGH (ref 0–125)

## 2014-11-03 MED ORDER — OXYCODONE-ACETAMINOPHEN 5-325 MG PO TABS
1.0000 | ORAL_TABLET | Freq: Once | ORAL | Status: AC
Start: 2014-11-03 — End: 2014-11-03
  Administered 2014-11-03: 1 via ORAL
  Filled 2014-11-03: qty 1

## 2014-11-03 MED ORDER — HYDROCODONE-ACETAMINOPHEN 5-325 MG PO TABS: 1.0000 | ORAL_TABLET | Freq: Four times a day (QID) | ORAL | Status: DC | PRN

## 2014-11-03 NOTE — ED Notes (Signed)
Pt placed in a gown, hooked up to the cardiac monitor with the BP cuff and pulse ox on

## 2014-11-03 NOTE — ED Notes (Signed)
The pt  Is c/o leg cramps bi-laterally for 2 days and she feel like her eyes are draining.  She is a dialysis pt and she missed dialysis yesterday because her daughter was ill.  Dialysis fitula  Lt uppper arm hx of lupus also.

## 2014-11-03 NOTE — Discharge Instructions (Signed)
Return here as needed. Follow up with your doctor and go to Dialysis

## 2014-11-03 NOTE — ED Provider Notes (Signed)
CSN: [id number]     Arrival date & time 11/03/14  2040 History   First MD Initiated Contact with Patient 11/03/14 2115     Chief Complaint  Patient presents with  . Leg Pain     (Consider location/radiation/quality/duration/timing/severity/associated sxs/prior Treatment) HPI Patient presents to the emergency department with pain in her right inguinal area and mid upper thigh and right shoulder.  The patient states that she missed her dialysis on Friday and the pain started later that evening.  The patient states that she has had no chest pain, shortness of breath, weakness, dizziness, back pain, fever, cough, lightheadedness, headache, blurred vision, abdominal pain or syncope.  Patient states that movement and palpation make the pain worse Past Medical History  Diagnosis Date  . Lupus (systemic lupus erythematosus)     Previously followed with Dr. Charlestine Night, has not followed up recently  . Lupus nephritis 2006    Renal biopsy shows segmental endocapillary proliferation and cellular crescent formation (Class IIIA) and lupus membranous glomerulopathy (Class V, stage II)  . S/P pericardiocentesis 01/17/2013    H/o pericardial effusion with tamponade 2006   . H/O pleural effusion 01/17/2013  . H/O pericarditis 01/17/2013  . Polysubstance abuse     cocaine, MJ, tobacco   Past Surgical History  Procedure Laterality Date  . Bascilic vein transposition Left 02/05/2014    Procedure: [place];  Surgeon: [name], MD;  Location: [place];  Service: Vascular;  Laterality: Left;   No family history on file. History  Substance Use Topics  . Smoking status: Current Some Day Smoker -- 0.25 packs/day for 15 years  . Smokeless tobacco: Never Used     Comment: quit smoking s/p hospital discharge/ SMOKES ABOUT 2-3 CIGARETTES A DAY  . Alcohol Use: Yes     Comment: "binge drinking" most weekends, per pt report   OB History    No data available     Review of Systems All other  systems negative except as documented in the HPI. All pertinent positives and negatives as reviewed in the HPI.   Allergies  Tobramycin sulfate  Home Medications   Prior to Admission medications   Medication Sig Start Date End Date Taking? Authorizing Provider  amLODipine (NORVASC) 10 MG tablet Take 1 tablet (10 mg total) by mouth daily. 03/15/14  Yes Dorian Heckle, MD  calcium acetate (PHOSLO) 667 MG capsule Take 1,334 mg by mouth 3 (three) times daily with meals.   Yes Historical Provider, MD  darbepoetin (ARANESP) 60 MCG/0.3ML SOLN injection Inject 0.3 mLs (60 mcg total) into the vein every Wednesday with hemodialysis. 02/08/14  Yes Jessee Avers, MD  diphenhydramine-acetaminophen (TYLENOL PM) 25-500 MG TABS Take 1 tablet by mouth at bedtime as needed (sleep).    Yes Historical Provider, MD  lidocaine-prilocaine (EMLA) cream Apply 1 application topically as needed (dialysis).   Yes Historical Provider, MD  multivitamin (RENA-VIT) TABS tablet Take 1 tablet by mouth daily.   Yes Historical Provider, MD  lisinopril (PRINIVIL,ZESTRIL) 10 MG tablet Take 1 tablet (10 mg total) by mouth daily. Patient not taking: Reported on 11/03/2014 04/19/14 04/19/15  Lucious Groves, DO   BP 153/102 mmHg  Pulse 115  Temp(Src) 98 F (36.7 C) (Oral)  Resp 12  SpO2 100%  LMP 11/27/2013 (Approximate) Physical Exam  Constitutional: She is oriented to person, place, and time. She appears well-developed and well-nourished. No distress.  HENT:  Head: Normocephalic and atraumatic.  Mouth/Throat: Oropharynx is clear and moist.  Eyes:  Pupils are equal, round, and reactive to light.  Neck: Normal range of motion. Neck supple.  Cardiovascular: Normal rate, regular rhythm and normal heart sounds.  Exam reveals no gallop and no friction rub.   No murmur heard. Pulmonary/Chest: Effort normal and breath sounds normal. No respiratory distress. She has no wheezes. She has no rales.  Musculoskeletal:       Arms:       Legs: Neurological: She is alert and oriented to person, place, and time. She exhibits normal muscle tone. Coordination normal.  Skin: Skin is warm and dry. No rash noted. No erythema.  Nursing note and vitals reviewed.   ED Course  Procedures (including critical care time) Labs Review Labs Reviewed  CBC WITH DIFFERENTIAL - Abnormal; Notable for the following:    WBC 2.6 (*)    RBC 3.82 (*)    Hemoglobin 10.2 (*)    HCT 34.1 (*)    MCHC 29.9 (*)    RDW 19.9 (*)    Neutrophils Relative % 33 (*)    Monocytes Relative 22 (*)    Neutro Abs 0.9 (*)    All other components within normal limits  COMPREHENSIVE METABOLIC PANEL - Abnormal; Notable for the following:    BUN 80 (*)    Creatinine, Ser 13.52 (*)    Calcium 8.2 (*)    Albumin 2.4 (*)    Total Bilirubin 0.2 (*)    GFR calc non Af Amer 3 (*)    GFR calc Af Amer 4 (*)    Anion gap 18 (*)    All other components within normal limits  PRO B NATRIURETIC PEPTIDE - Abnormal; Notable for the following:    Pro B Natriuretic peptide (BNP) 37602.0 (*)    All other components within normal limits    Imaging Review Dg Chest 2 View  11/03/2014   CLINICAL DATA:  Heart fluttering after missing dialysis  EXAM: CHEST  2 VIEW  COMPARISON:  Radiograph 02/27/2014  FINDINGS: Stable enlarged cardiac silhouette. There are bilateral pleural effusions which are improved compared to prior. Small effusion on the left is larger than the right. There is no overt pulmonary edema. Central venous congestion is similar.  IMPRESSION: 1. Improvement in pleural effusions compared to prior. 2. Moderate left effusion and basilar atelectasis. 3. Central venous pulmonary congestion.   Electronically Signed   By: Suzy Bouchard M.D.   On: 11/03/2014 22:38      I advised the patient that this could be related to the fact she has not had dialysis and some of the wound blanket caused this discomfort.  I advised the patient to return here as needed.  She does not  have any signs of significant abnormality on her laboratory testing other than her chronic issues  Brent General, PA-C 11/04/14 0105  Carmin Muskrat, MD 11/04/14 [phone]

## 2014-11-04 ENCOUNTER — Telehealth (HOSPITAL_COMMUNITY): Payer: Self-pay

## 2014-11-04 NOTE — Telephone Encounter (Signed)
Pharmacy calling for Medicaid ID # Pts idenitity has been verified.  # provided to [facility].

## 2014-11-05 LAB — PATHOLOGIST SMEAR REVIEW

## 2014-11-15 IMAGING — CR DG CHEST 1V PORT
1 series · 1 of 1 positions shown · non-contrast
Comparison: DG CHEST 1V PORT dated 02/02/2014; DG CHEST PORT [id number]
DAY dated 01/31/2014; DG CHEST 2 VIEW dated 01/29/2014

CLINICAL DATA: Evaluate pleural effusion

EXAM:
PORTABLE CHEST - 1 VIEW

[AP]
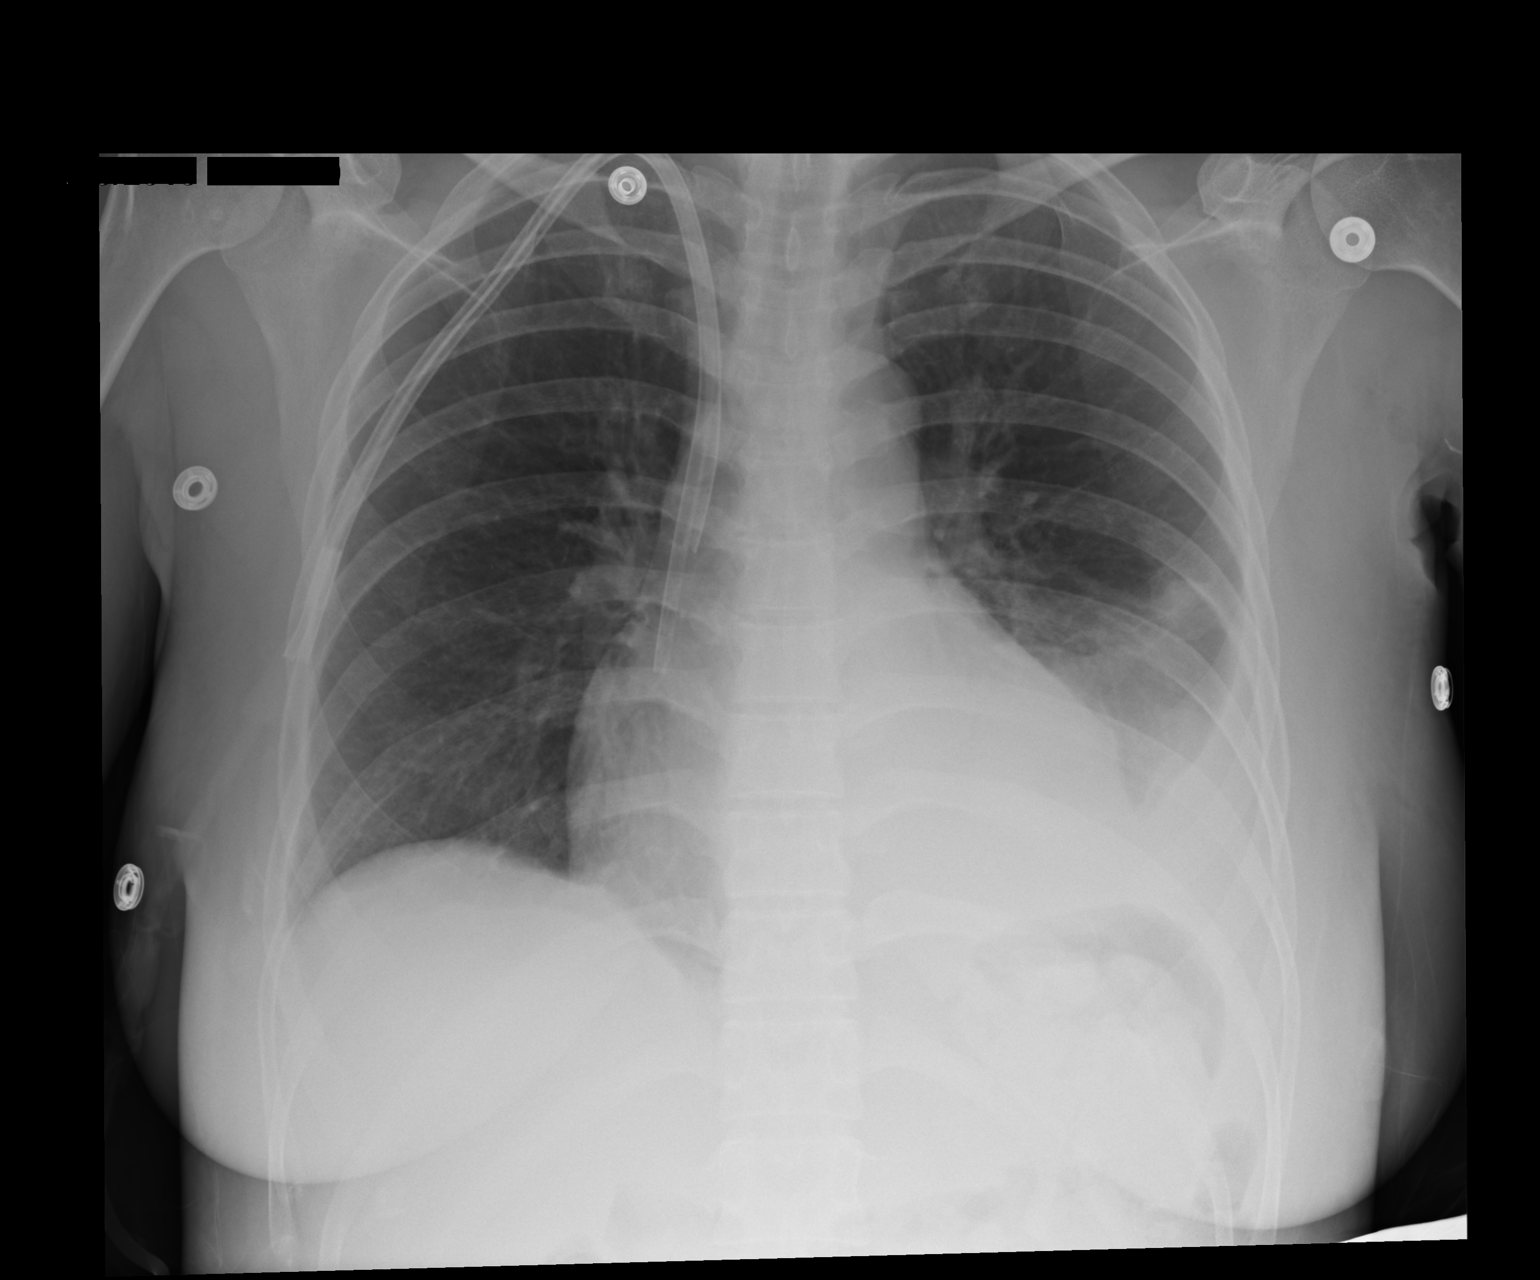

[1 of 1 positions shown; findings below may reference images not displayed]

FINDINGS: Grossly unchanged cardiac silhouette and mediastinal contours with
partial obscuration of the left heart border secondary to minimally
decreased though persistent left-sided pleural effusion and
associated left mid and lower lung heterogeneous / consolidative
opacities. Stable positioning of support apparatus. No pneumothorax.
No new focal airspace opacities. No evidence of edema. Unchanged
bones.
IMPRESSION: Suspected minimal reduction in persistent small sized left-sided
effusion.

## 2014-11-26 IMAGING — CR DG CHEST 2V
2 series · 2 of 2 positions shown · non-contrast
Comparison: Portable chest x-ray of 02/05/2014

CLINICAL DATA: Status post left thoracentesis 2 weeks ago

EXAM:
CHEST  2 VIEW

[w chest pa]
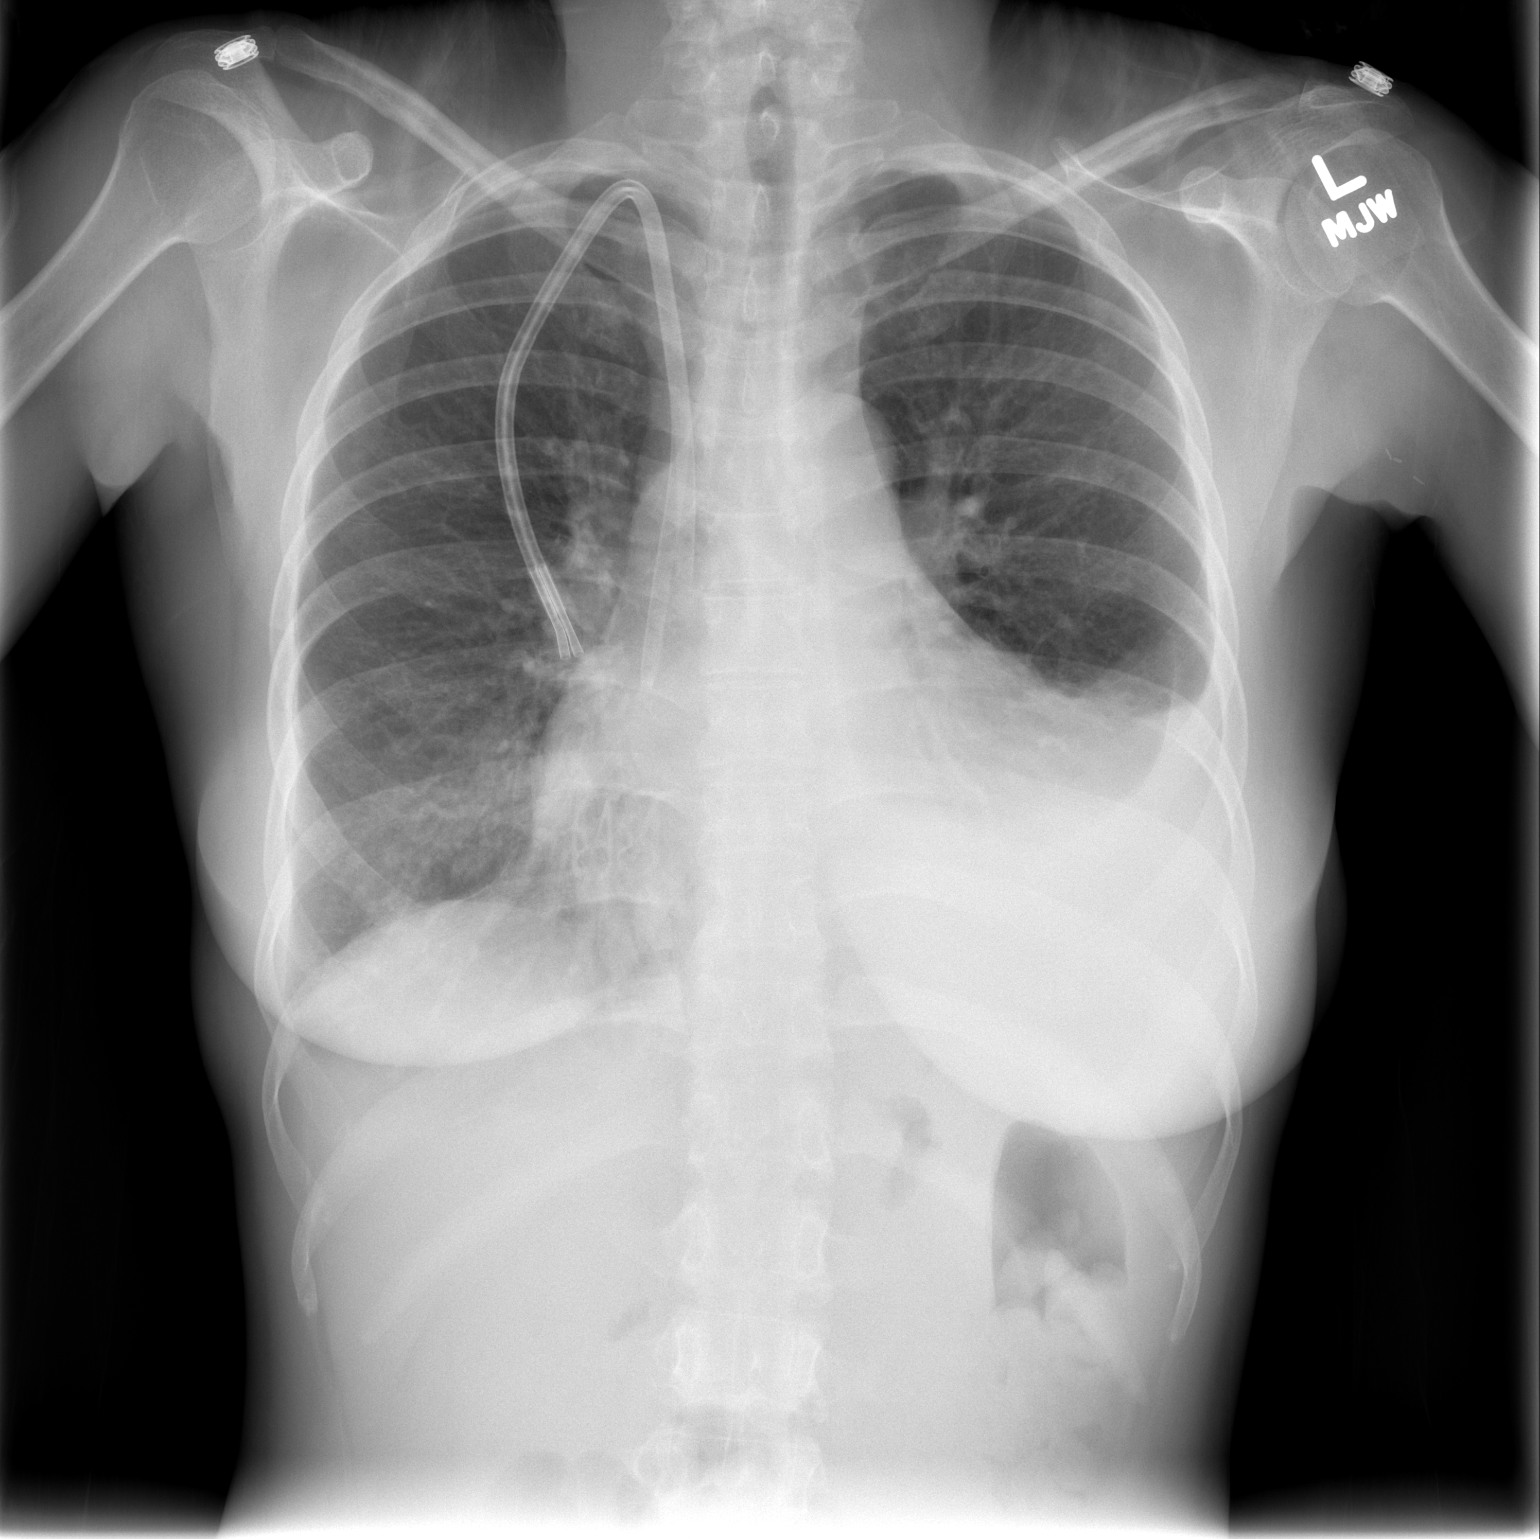

[w chest lat]
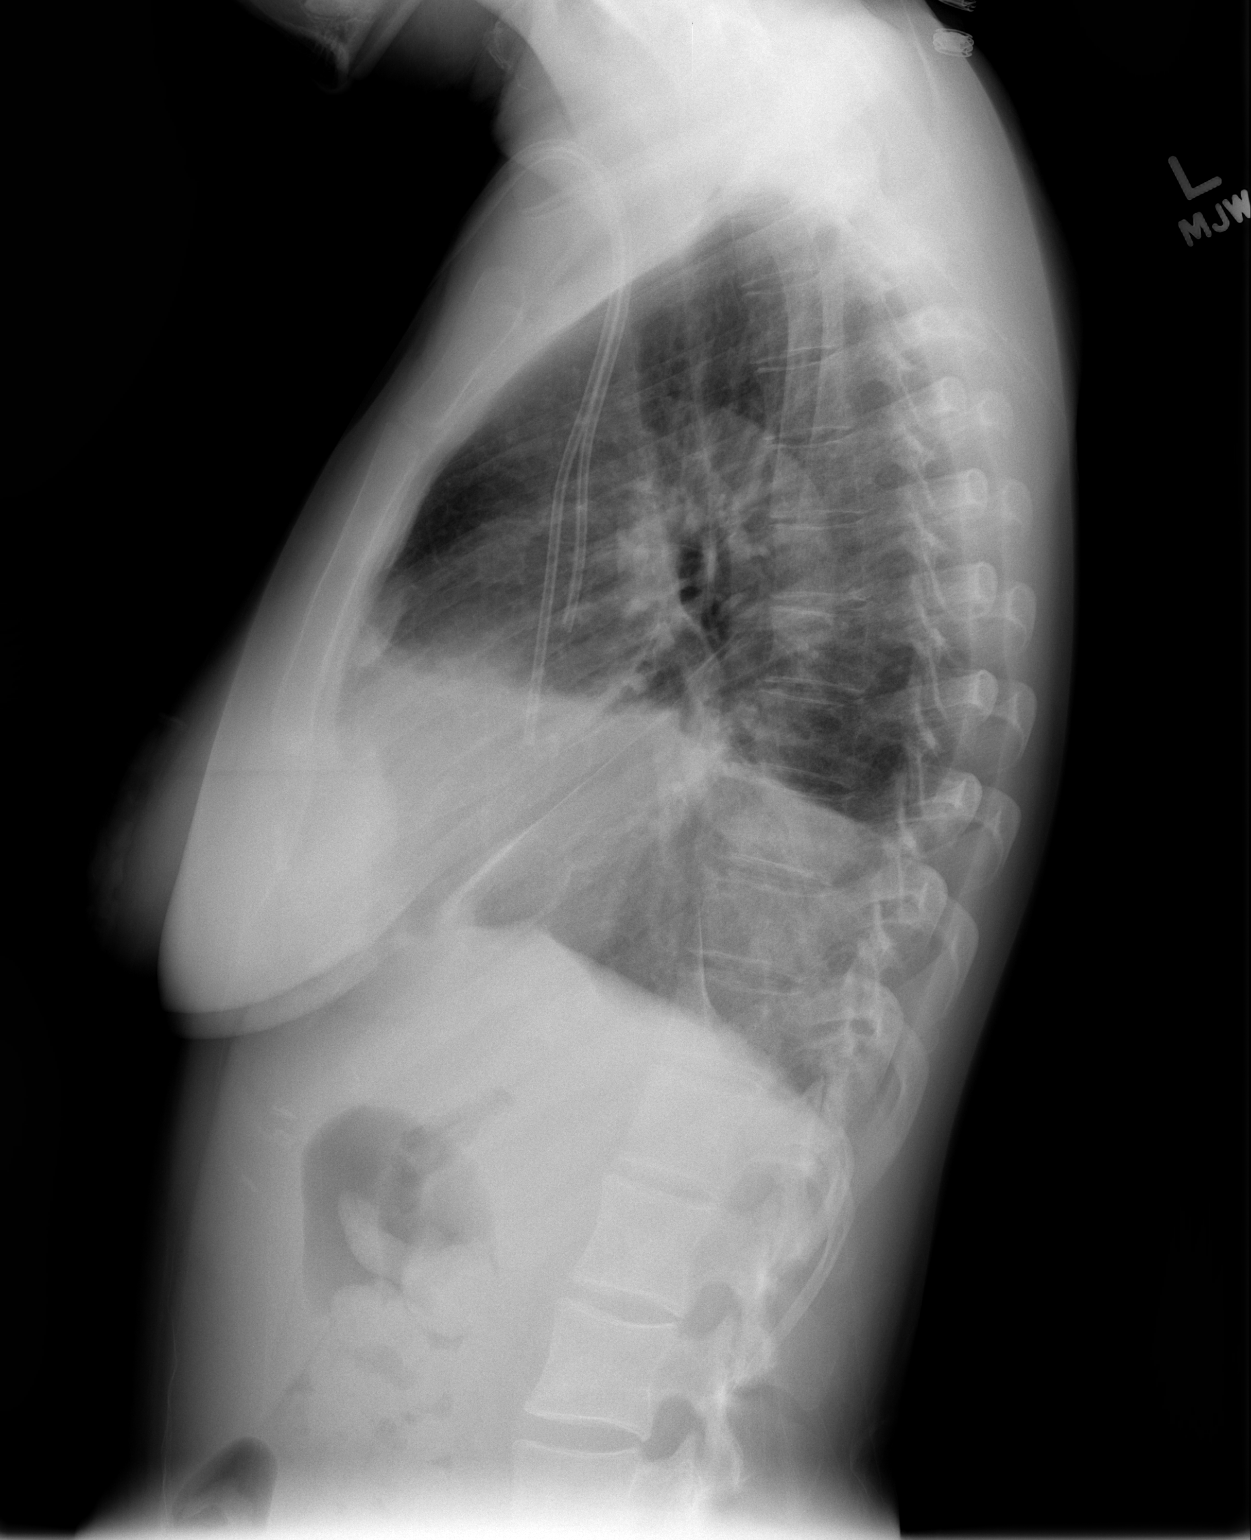

[2 of 2 positions shown; findings below may reference images not displayed]

FINDINGS: A moderate sized left pleural effusion remains, possibly slightly
increased in volume in the interval. There is resultant left basilar
atelectasis present. Mild right basilar atelectasis or scarring is
noted. Cardiomegaly is stable. A central venous line is unchanged in
position.
IMPRESSION: Moderate size left pleural effusion remains which may have increased
slightly in volume with basilar atelectasis left greater than right.

## 2014-12-06 ENCOUNTER — Encounter (HOSPITAL_COMMUNITY): Payer: Self-pay | Admitting: Surgery

## 2014-12-25 ENCOUNTER — Encounter (HOSPITAL_COMMUNITY): Payer: Self-pay | Admitting: Emergency Medicine

## 2014-12-25 ENCOUNTER — Emergency Department (HOSPITAL_COMMUNITY)
Admission: EM | Admit: 2014-12-25 | Discharge: 2014-12-25 | Disposition: A | Discharge status: Home / Self Care | Payer: Medicaid Other | Attending: Emergency Medicine | Admitting: Emergency Medicine

## 2014-12-25 DIAGNOSIS — Z8709 Personal history of other diseases of the respiratory system: Secondary | ICD-10-CM | POA: Insufficient documentation

## 2014-12-25 DIAGNOSIS — Z992 Dependence on renal dialysis: Secondary | ICD-10-CM | POA: Diagnosis not present

## 2014-12-25 DIAGNOSIS — Z8679 Personal history of other diseases of the circulatory system: Secondary | ICD-10-CM | POA: Diagnosis not present

## 2014-12-25 DIAGNOSIS — R21 Rash and other nonspecific skin eruption: Secondary | ICD-10-CM | POA: Diagnosis not present

## 2014-12-25 DIAGNOSIS — Z720 Tobacco use: Secondary | ICD-10-CM | POA: Insufficient documentation

## 2014-12-25 DIAGNOSIS — Z9889 Other specified postprocedural states: Secondary | ICD-10-CM | POA: Insufficient documentation

## 2014-12-25 DIAGNOSIS — N186 End stage renal disease: Secondary | ICD-10-CM | POA: Insufficient documentation

## 2014-12-25 DIAGNOSIS — M79662 Pain in left lower leg: Secondary | ICD-10-CM | POA: Insufficient documentation

## 2014-12-25 DIAGNOSIS — Z79899 Other long term (current) drug therapy: Secondary | ICD-10-CM | POA: Diagnosis not present

## 2014-12-25 DIAGNOSIS — M79605 Pain in left leg: Secondary | ICD-10-CM

## 2014-12-25 LAB — CBC WITH DIFFERENTIAL/PLATELET
Basophils Absolute: 0 10*3/uL (ref 0.0–0.1)
Basophils Relative: 0 % (ref 0–1)
EOS ABS: 0.1 10*3/uL (ref 0.0–0.7)
EOS PCT: 1 % (ref 0–5)
HCT: 40.7 % (ref 36.0–46.0)
Hemoglobin: 12.7 g/dL (ref 12.0–15.0)
LYMPHS PCT: 10 % — AB (ref 12–46)
Lymphs Abs: 0.6 10*3/uL — ABNORMAL LOW (ref 0.7–4.0)
MCH: 27.9 pg (ref 26.0–34.0)
MCHC: 31.2 g/dL (ref 30.0–36.0)
MCV: 89.5 fL (ref 78.0–100.0)
Monocytes Absolute: 0.6 10*3/uL (ref 0.1–1.0)
Monocytes Relative: 10 % (ref 3–12)
Neutro Abs: 4.5 10*3/uL (ref 1.7–7.7)
Neutrophils Relative %: 79 % — ABNORMAL HIGH (ref 43–77)
PLATELETS: 181 10*3/uL (ref 150–400)
RBC: 4.55 MIL/uL (ref 3.87–5.11)
RDW: 17.8 % — AB (ref 11.5–15.5)
WBC: 5.8 10*3/uL (ref 4.0–10.5)

## 2014-12-25 LAB — COMPREHENSIVE METABOLIC PANEL
ALT: 15 U/L (ref 0–35)
AST: 21 U/L (ref 0–37)
Albumin: 2.8 g/dL — ABNORMAL LOW (ref 3.5–5.2)
Alkaline Phosphatase: 57 U/L (ref 39–117)
Anion gap: 18 — ABNORMAL HIGH (ref 5–15)
BUN: 134 mg/dL — ABNORMAL HIGH (ref 6–23)
CALCIUM: 7.9 mg/dL — AB (ref 8.4–10.5)
CO2: 20 mmol/L (ref 19–32)
Chloride: 101 mEq/L (ref 96–112)
Creatinine, Ser: 17.89 mg/dL — ABNORMAL HIGH (ref 0.50–1.10)
GFR calc Af Amer: 3 mL/min — ABNORMAL LOW (ref >90)
GFR calc non Af Amer: 2 mL/min — ABNORMAL LOW (ref >90)
Glucose, Bld: 122 mg/dL — ABNORMAL HIGH (ref 70–99)
Potassium: 5.5 mmol/L — ABNORMAL HIGH (ref 3.5–5.1)
SODIUM: 139 mmol/L (ref 135–145)
TOTAL PROTEIN: 7.9 g/dL (ref 6.0–8.3)
Total Bilirubin: 0.5 mg/dL (ref 0.3–1.2)

## 2014-12-25 LAB — I-STAT CHEM 8, ED
BUN: 127 mg/dL — ABNORMAL HIGH (ref 6–23)
CHLORIDE: 105 meq/L (ref 96–112)
CREATININE: 16.6 mg/dL — AB (ref 0.50–1.10)
Calcium, Ion: 0.92 mmol/L — ABNORMAL LOW (ref 1.12–1.23)
GLUCOSE: 117 mg/dL — AB (ref 70–99)
HCT: 45 % (ref 36.0–46.0)
Hemoglobin: 15.3 g/dL — ABNORMAL HIGH (ref 12.0–15.0)
POTASSIUM: 5.5 mmol/L — AB (ref 3.5–5.1)
Sodium: 137 mmol/L (ref 135–145)
TCO2: 18 mmol/L (ref 0–100)

## 2014-12-25 LAB — I-STAT TROPONIN, ED: Troponin i, poc: 0.05 ng/mL (ref 0.00–0.08)

## 2014-12-25 MED ORDER — OXYCODONE-ACETAMINOPHEN 5-325 MG PO TABS
1.0000 | ORAL_TABLET | Freq: Once | ORAL | Status: AC
  Administered 2014-12-25: 1 via ORAL
  Filled 2014-12-25: qty 1

## 2014-12-25 MED ORDER — HYDROCODONE-ACETAMINOPHEN 5-325 MG PO TABS: 1.0000 | ORAL_TABLET | Freq: Four times a day (QID) | ORAL | Status: DC | PRN

## 2014-12-25 MED ORDER — TRIAMCINOLONE ACETONIDE 0.1 % EX CREA: 1.0000 "application " | TOPICAL_CREAM | Freq: Two times a day (BID) | CUTANEOUS | Status: DC

## 2014-12-25 NOTE — Progress Notes (Signed)
VASCULAR LAB PRELIMINARY  PRELIMINARY  PRELIMINARY  PRELIMINARY  Left leg venous duplex  completed.    Preliminary report:  Left leg is negative for deep and superficial vein thrombosis.  Ulys Favia, RVT 12/25/2014, 2:16 PM

## 2014-12-25 NOTE — ED Notes (Signed)
Secretary contacting vascular to see ETA for study

## 2014-12-25 NOTE — ED Notes (Signed)
Pt sts missed dialysis and last dialysis was Thursday 12/24; pt sts "lupus flare" with pain to left leg and rash to arm and right side of chest

## 2014-12-25 NOTE — ED Notes (Signed)
Pt reports receiving HD T, TH, Sat, pt states, " I haven't been getting my full treatment because I throw up & they tell me just to watch my fluids. They are on a different schedule now because of the holidays & I go on Sunday. I got a partial treatment on Thursday, but I started cramping in my legs & they stopped."

## 2014-12-25 NOTE — Discharge Instructions (Signed)
Your testing here today was not significantly abnormal other than a mildly elevated potassium.  I spoke with the nephrologist, and he will call her earlier dialysis center and they will be expecting to, soon as you are discharged straight to the dialysis center and tell them that we spoke with the on-call nephrologist and that she is due to get dialysis today

## 2014-12-25 NOTE — ED Provider Notes (Signed)
CSN: [id number]     Arrival date & time 12/25/14  0932 History   First MD Initiated Contact with Patient 12/25/14 [phone]     Chief Complaint  Patient presents with  . Leg Pain  . Vascular Access Problem     (Consider location/radiation/quality/duration/timing/severity/associated sxs/prior Treatment) HPI Patient presents to the emergency department with pain in her left leg that started yesterday.  The patient states that she missed dialysis.  Last dialysis was Thursday the 24th.  Patient states that she also has a rash to her right side of her breast is been present for the last 2 weeks.  The patient states that she has not had any chest pain, shortness of breath, nausea, vomiting, weakness, dizziness, headache, blurred vision, back pain, neck pain, fever, cough, runny nose, sore throat, or syncope.  The patient states that he did not take any medications prior to arrival for her symptoms.  Nothing seems make her condition, better with movement and palpation make the pain worse Past Medical History  Diagnosis Date  . Lupus (systemic lupus erythematosus)     Previously followed with Dr. Charlestine Night, has not followed up recently  . Lupus nephritis 2006    Renal biopsy shows segmental endocapillary proliferation and cellular crescent formation (Class IIIA) and lupus membranous glomerulopathy (Class V, stage II)  . S/P pericardiocentesis 01/17/2013    H/o pericardial effusion with tamponade 2006   . H/O pleural effusion 01/17/2013  . H/O pericarditis 01/17/2013  . Polysubstance abuse     cocaine, MJ, tobacco   Past Surgical History  Procedure Laterality Date  . Bascilic vein transposition Left 02/05/2014    Procedure: [place];  Surgeon: [name], MD;  Location: [place];  Service: Vascular;  Laterality: Left;  Annell Greening Right 01/31/2014    Procedure: DIALYSIS CATHETER;  Surgeon: Serafina Mitchell, MD;  Location: [facility] C[facility] LAB;  Service: Cardiovascular;  Laterality: Right;   History  reviewed. No pertinent family history. History  Substance Use Topics  . Smoking status: Current Some Day Smoker -- 0.25 packs/day for 15 years  . Smokeless tobacco: Never Used     Comment: quit smoking s/p hospital discharge/ SMOKES ABOUT 2-3 CIGARETTES A DAY  . Alcohol Use: Yes     Comment: "binge drinking" most weekends, per pt report   OB History    No data available     Review of Systems All other systems negative except as documented in the HPI. All pertinent positives and negatives as reviewed in the HPI.   Allergies  Tobramycin sulfate  Home Medications   Prior to Admission medications   Medication Sig Start Date End Date Taking? Authorizing Provider  amLODipine (NORVASC) 10 MG tablet Take 1 tablet (10 mg total) by mouth daily. 03/15/14  Yes Dorian Heckle, MD  calcium acetate (PHOSLO) 667 MG capsule Take 1,334 mg by mouth 3 (three) times daily with meals.   Yes Historical Provider, MD  darbepoetin (ARANESP) 60 MCG/0.3ML SOLN injection Inject 0.3 mLs (60 mcg total) into the vein every Wednesday with hemodialysis. 02/08/14  Yes Jessee Avers, MD  diphenhydramine-acetaminophen (TYLENOL PM) 25-500 MG TABS Take 1 tablet by mouth at bedtime as needed (sleep).    Yes Historical Provider, MD  multivitamin (RENA-VIT) TABS tablet Take 1 tablet by mouth daily.   Yes Historical Provider, MD  Naproxen Sodium (ALEVE) 220 MG CAPS Take 2 capsules by mouth 2 (two) times daily as needed (pain).   Yes Historical Provider, MD  HYDROcodone-acetaminophen (NORCO/VICODIN)  5-325 MG per tablet Take 1 tablet by mouth every 6 (six) hours as needed for moderate pain. Patient not taking: Reported on 12/25/2014 11/03/14   Resa Miner Keon Pender, PA-C  lisinopril (PRINIVIL,ZESTRIL) 10 MG tablet Take 1 tablet (10 mg total) by mouth daily. Patient not taking: Reported on 11/03/2014 04/19/14 04/19/15  Lucious Groves, DO   BP 143/96 mmHg  Pulse 87  Temp(Src) 97.9 F (36.6 C) (Oral)  Resp 16  Ht 5\' 5"  (1.651 m)   Wt 121 lb (54.885 kg)  BMI 20.14 kg/m2  SpO2 98% Physical Exam  Constitutional: She is oriented to person, place, and time. She appears well-developed and well-nourished. No distress.  HENT:  Head: Normocephalic and atraumatic.  Mouth/Throat: Oropharynx is clear and moist.  Eyes: Pupils are equal, round, and reactive to light.  Neck: Normal range of motion. Neck supple.  Cardiovascular: Normal rate, regular rhythm and normal heart sounds.  Exam reveals no gallop and no friction rub.   No murmur heard. Pulmonary/Chest: Effort normal and breath sounds normal. No respiratory distress.  Musculoskeletal: She exhibits no edema.  Neurological: She is alert and oriented to person, place, and time. She exhibits normal muscle tone. Coordination normal.  Skin: Skin is warm. No rash noted. No erythema.  Nursing note and vitals reviewed.   ED Course  Procedures (including critical care time) Labs Review Labs Reviewed  CBC WITH DIFFERENTIAL - Abnormal; Notable for the following:    RDW 17.8 (*)    Neutrophils Relative % 79 (*)    Lymphocytes Relative 10 (*)    Lymphs Abs 0.6 (*)    All other components within normal limits  COMPREHENSIVE METABOLIC PANEL - Abnormal; Notable for the following:    Potassium 5.5 (*)    Glucose, Bld 122 (*)    BUN 134 (*)    Creatinine, Ser 17.89 (*)    Calcium 7.9 (*)    Albumin 2.8 (*)    GFR calc non Af Amer 2 (*)    GFR calc Af Amer 3 (*)    Anion gap 18 (*)    All other components within normal limits  I-STAT CHEM 8, ED - Abnormal; Notable for the following:    Potassium 5.5 (*)    BUN 127 (*)    Creatinine, Ser 16.60 (*)    Glucose, Bld 117 (*)    Calcium, Ion 0.92 (*)    Hemoglobin 15.3 (*)    All other components within normal limits  I-STAT TROPOININ, ED    Imaging Review No results found.   EKG Interpretation   Date/Time:  Tuesday December 25 2014 11:29:02 EST Ventricular Rate:  94 PR Interval:  130 QRS Duration: 77 QT Interval:   393 QTC Calculation: 491 R Axis:   91 Text Interpretation:  Sinus rhythm Probable left atrial enlargement  Borderline right axis deviation Abnormal T, consider ischemia, diffuse  leads No significant change since last tracing Confirmed by YAO  MD, DAVID  ([id number]) on 12/25/2014 11:55:33 AM     I spoke with the nephrologist on call.  He will call over to the dialysis center that she normally obtains dialysis and will tell them that she will be there this afternoon.  The patient is DVT study was negative.  The patient will be advised follow-up with her primary care doctor will be given pain control told to return here as needed MDM   Final diagnoses:  None        Brent General, PA-C  12/25/14 Baltic Yao, MD 12/25/14 [phone]

## 2015-01-09 ENCOUNTER — Encounter: Payer: Self-pay | Admitting: Internal Medicine

## 2015-03-06 ENCOUNTER — Telehealth: Payer: Self-pay | Admitting: Internal Medicine

## 2015-03-06 NOTE — Telephone Encounter (Signed)
Call to patient to confirm appointment for 03/07/15 at 3:45 phone numbers are not available

## 2015-03-07 ENCOUNTER — Encounter: Payer: Medicaid Other | Admitting: Internal Medicine

## 2015-03-07 ENCOUNTER — Encounter: Payer: Self-pay | Admitting: Internal Medicine

## 2015-03-28 ENCOUNTER — Encounter: Payer: Self-pay | Admitting: Internal Medicine

## 2015-05-01 ENCOUNTER — Encounter (HOSPITAL_COMMUNITY): Payer: Self-pay | Admitting: *Deleted

## 2015-05-01 ENCOUNTER — Emergency Department (HOSPITAL_COMMUNITY)
Admission: EM | Admit: 2015-05-01 | Discharge: 2015-05-01 | Disposition: A | Discharge status: Home / Self Care | Payer: Medicaid Other | Attending: Emergency Medicine | Admitting: Emergency Medicine

## 2015-05-01 DIAGNOSIS — M62830 Muscle spasm of back: Secondary | ICD-10-CM | POA: Insufficient documentation

## 2015-05-01 DIAGNOSIS — Z7952 Long term (current) use of systemic steroids: Secondary | ICD-10-CM | POA: Insufficient documentation

## 2015-05-01 DIAGNOSIS — Z3202 Encounter for pregnancy test, result negative: Secondary | ICD-10-CM | POA: Insufficient documentation

## 2015-05-01 DIAGNOSIS — Z9889 Other specified postprocedural states: Secondary | ICD-10-CM | POA: Insufficient documentation

## 2015-05-01 DIAGNOSIS — Z992 Dependence on renal dialysis: Secondary | ICD-10-CM | POA: Insufficient documentation

## 2015-05-01 DIAGNOSIS — M545 Low back pain: Secondary | ICD-10-CM | POA: Diagnosis present

## 2015-05-01 DIAGNOSIS — Z720 Tobacco use: Secondary | ICD-10-CM | POA: Insufficient documentation

## 2015-05-01 DIAGNOSIS — N186 End stage renal disease: Secondary | ICD-10-CM | POA: Diagnosis not present

## 2015-05-01 DIAGNOSIS — Z79899 Other long term (current) drug therapy: Secondary | ICD-10-CM | POA: Diagnosis not present

## 2015-05-01 DIAGNOSIS — Z8679 Personal history of other diseases of the circulatory system: Secondary | ICD-10-CM | POA: Insufficient documentation

## 2015-05-01 LAB — CBC WITH DIFFERENTIAL/PLATELET
BASOS PCT: 0 % (ref 0–1)
Basophils Absolute: 0 10*3/uL (ref 0.0–0.1)
EOS PCT: 4 % (ref 0–5)
Eosinophils Absolute: 0.2 10*3/uL (ref 0.0–0.7)
HCT: 42.3 % (ref 36.0–46.0)
Hemoglobin: 13.3 g/dL (ref 12.0–15.0)
LYMPHS PCT: 21 % (ref 12–46)
Lymphs Abs: 0.8 10*3/uL (ref 0.7–4.0)
MCH: 28.2 pg (ref 26.0–34.0)
MCHC: 31.4 g/dL (ref 30.0–36.0)
MCV: 89.8 fL (ref 78.0–100.0)
MONO ABS: 0.5 10*3/uL (ref 0.1–1.0)
Monocytes Relative: 14 % — ABNORMAL HIGH (ref 3–12)
NEUTROS PCT: 61 % (ref 43–77)
Neutro Abs: 2.3 10*3/uL (ref 1.7–7.7)
Platelets: 164 10*3/uL (ref 150–400)
RBC: 4.71 MIL/uL (ref 3.87–5.11)
RDW: 14.7 % (ref 11.5–15.5)
WBC: 3.8 10*3/uL — ABNORMAL LOW (ref 4.0–10.5)

## 2015-05-01 LAB — COMPREHENSIVE METABOLIC PANEL
ALBUMIN: 2.8 g/dL — AB (ref 3.5–5.0)
ALT: 13 U/L — ABNORMAL LOW (ref 14–54)
ANION GAP: 15 (ref 5–15)
AST: 17 U/L (ref 15–41)
Alkaline Phosphatase: 56 U/L (ref 38–126)
BUN: 72 mg/dL — AB (ref 6–20)
CHLORIDE: 102 mmol/L (ref 101–111)
CO2: 21 mmol/L — ABNORMAL LOW (ref 22–32)
CREATININE: 13.61 mg/dL — AB (ref 0.44–1.00)
Calcium: 7.6 mg/dL — ABNORMAL LOW (ref 8.9–10.3)
GFR calc Af Amer: 4 mL/min — ABNORMAL LOW (ref >60)
GFR calc non Af Amer: 3 mL/min — ABNORMAL LOW (ref >60)
Glucose, Bld: 81 mg/dL (ref 70–99)
Potassium: 4.7 mmol/L (ref 3.5–5.1)
Sodium: 138 mmol/L (ref 135–145)
Total Bilirubin: 0.5 mg/dL (ref 0.3–1.2)
Total Protein: 7.7 g/dL (ref 6.5–8.1)

## 2015-05-01 LAB — I-STAT BETA HCG BLOOD, ED (MC, WL, AP ONLY)

## 2015-05-01 MED ORDER — DIAZEPAM 5 MG PO TABS
5.0000 mg | ORAL_TABLET | Freq: Once | ORAL | Status: AC
  Administered 2015-05-01: 5 mg via ORAL
  Filled 2015-05-01: qty 1

## 2015-05-01 MED ORDER — DIAZEPAM 5 MG PO TABS: 5.0000 mg | ORAL_TABLET | Freq: Three times a day (TID) | ORAL | Status: DC | PRN

## 2015-05-01 NOTE — Discharge Instructions (Signed)
Take valium as prescribed as needed for spasms. Try to stretch, heating pads. Follow up with your doctor. Return if worsening.   Muscle Cramps and Spasms Muscle cramps and spasms occur when a muscle or muscles tighten and you have no control over this tightening (involuntary muscle contraction). They are a common problem and can develop in any muscle. The most common place is in the calf muscles of the leg. Both muscle cramps and muscle spasms are involuntary muscle contractions, but they also have differences:   Muscle cramps are sporadic and painful. They may last a few seconds to a quarter of an hour. Muscle cramps are often more forceful and last longer than muscle spasms.  Muscle spasms may or may not be painful. They may also last just a few seconds or much longer. CAUSES  It is uncommon for cramps or spasms to be due to a serious underlying problem. In many cases, the cause of cramps or spasms is unknown. Some common causes are:   Overexertion.   Overuse from repetitive motions (doing the same thing over and over).   Remaining in a certain position for a long period of time.   Improper preparation, form, or technique while performing a sport or activity.   Dehydration.   Injury.   Side effects of some medicines.   Abnormally low levels of the salts and ions in your blood (electrolytes), especially potassium and calcium. This could happen if you are taking water pills (diuretics) or you are pregnant.  Some underlying medical problems can make it more likely to develop cramps or spasms. These include, but are not limited to:   Diabetes.   Parkinson disease.   Hormone disorders, such as thyroid problems.   Alcohol abuse.   Diseases specific to muscles, joints, and bones.   Blood vessel disease where not enough blood is getting to the muscles.  HOME CARE INSTRUCTIONS   Stay well hydrated. Drink enough water and fluids to keep your urine clear or pale  yellow.  It may be helpful to massage, stretch, and relax the affected muscle.  For tight or tense muscles, use a warm towel, heating pad, or hot shower water directed to the affected area.  If you are sore or have pain after a cramp or spasm, applying ice to the affected area may relieve discomfort.  Put ice in a plastic bag.  Place a towel between your skin and the bag.  Leave the ice on for 15-20 minutes, 03-04 times a day.  Medicines used to treat a known cause of cramps or spasms may help reduce their frequency or severity. Only take over-the-counter or prescription medicines as directed by your caregiver. SEEK MEDICAL CARE IF:  Your cramps or spasms get more severe, more frequent, or do not improve over time.  MAKE SURE YOU:   Understand these instructions.  Will watch your condition.  Will get help right away if you are not doing well or get worse. Document Released: 06/05/2002 Document Revised: 04/10/2013 Document Reviewed: 11/30/2012 [facility] Patient Information 2015 [place], [place]. This information is not intended to replace advice given to you by your health care provider. Make sure you discuss any questions you have with your health care provider.

## 2015-05-01 NOTE — ED Provider Notes (Signed)
CSN: [id number]     Arrival date & time 05/01/15  1420 History   First MD Initiated Contact with Patient 05/01/15 1607     Chief Complaint  Patient presents with  . Back Pain  . Abdominal Pain     (Consider location/radiation/quality/duration/timing/severity/associated sxs/prior Treatment) HPI [name] is a 34 y.o. female with history of lupus and end-stage renal disease on hemodialysis, presents to emergency department complaining of back pain. Patient states she had dialysis yesterday, while in dialysis she started having abdominal cramping which she states is normal for her. She states yesterday he took the most fluid out of her than ever before. She states abdominal cramping has resolved, but now she is having some lower back spasms. She states that spasms are across entire lower back. They're worse when she is walking or doing any other type of activity, better when she is laying still. Pain does not radiate. She states she does urinate but only at nighttime, denies any dysuria. She denies any fever or chills. No nausea or vomiting. Again abdominal pain is no longer there. Currently symptom-free. She states this pain feels like "spasms, almost like muscles are pulsating." Denies numbness or weakness in extremities. No injuries.  Past Medical History  Diagnosis Date  . Lupus (systemic lupus erythematosus)     Previously followed with Dr. Charlestine Night, has not followed up recently  . Lupus nephritis 2006    Renal biopsy shows segmental endocapillary proliferation and cellular crescent formation (Class IIIA) and lupus membranous glomerulopathy (Class V, stage II)  . S/P pericardiocentesis 01/17/2013    H/o pericardial effusion with tamponade 2006   . H/O pleural effusion 01/17/2013  . H/O pericarditis 01/17/2013  . Polysubstance abuse     cocaine, MJ, tobacco   Past Surgical History  Procedure Laterality Date  . Bascilic vein transposition Left 02/05/2014    Procedure: [place];  Surgeon: [name], MD;  Location: [place];  Service: Vascular;  Laterality: Left;  Annell Greening Right 01/31/2014    Procedure: DIALYSIS CATHETER;  Surgeon: Serafina Mitchell, MD;  Location: [facility] CATH LAB;  Service: Cardiovascular;  Laterality: Right;   History reviewed. No pertinent family history. History  Substance Use Topics  . Smoking status: Current Some Day Smoker -- 0.25 packs/day for 15 years  . Smokeless tobacco: Never Used     Comment: quit smoking s/p hospital discharge/ SMOKES ABOUT 2-3 CIGARETTES A DAY  . Alcohol Use: Yes     Comment: "binge drinking" most weekends, per pt report   OB History    No data available     Review of Systems  Constitutional: Negative for fever and chills.  Respiratory: Negative for cough, chest tightness and shortness of breath.   Cardiovascular: Negative for chest pain, palpitations and leg swelling.  Gastrointestinal: Negative for nausea, vomiting, abdominal pain and diarrhea.  Genitourinary: Negative for dysuria.  Musculoskeletal: Positive for back pain. Negative for myalgias, arthralgias, neck pain and neck stiffness.  Skin: Negative for rash.  Neurological: Negative for dizziness, weakness and headaches.  All other systems reviewed and are negative.     Allergies  Tobramycin sulfate  Home Medications   Prior to Admission medications   Medication Sig Start Date End Date Taking? Authorizing Provider  amLODipine (NORVASC) 10 MG tablet Take 1 tablet (10 mg total) by mouth daily. 03/15/14   Dorian Heckle, MD  calcium acetate (PHOSLO) 667 MG capsule Take 1,334 mg by mouth 3 (three) times daily with meals.  Historical Provider, MD  darbepoetin (ARANESP) 60 MCG/0.3ML SOLN injection Inject 0.3 mLs (60 mcg total) into the vein every Wednesday with hemodialysis. 02/08/14   Jessee Avers, MD  diphenhydramine-acetaminophen (TYLENOL PM) 25-500 MG TABS Take 1 tablet by mouth at bedtime as needed (sleep).     Historical Provider, MD   HYDROcodone-acetaminophen (NORCO/VICODIN) 5-325 MG per tablet Take 1 tablet by mouth every 6 (six) hours as needed for moderate pain. 12/25/14   Dalia Heading, PA-C  multivitamin (RENA-VIT) TABS tablet Take 1 tablet by mouth daily.    Historical Provider, MD  Naproxen Sodium (ALEVE) 220 MG CAPS Take 2 capsules by mouth 2 (two) times daily as needed (pain).    Historical Provider, MD  triamcinolone cream (KENALOG) 0.1 % Apply 1 application topically 2 (two) times daily. 12/25/14   Christopher Lawyer, PA-C   BP 138/106 mmHg  Pulse 76  Temp(Src) 98.2 F (36.8 C) (Oral)  Resp 18  Ht 5\' 4"  (1.626 m)  Wt 112 lb 7 oz (51 kg)  BMI 19.29 kg/m2  SpO2 95% Physical Exam  Constitutional: She is oriented to person, place, and time. She appears well-developed and well-nourished. No distress.  HENT:  Head: Normocephalic.  Eyes: Conjunctivae are normal.  Neck: Neck supple.  Cardiovascular: Normal rate, regular rhythm and normal heart sounds.   Pulmonary/Chest: Effort normal and breath sounds normal. No respiratory distress. She has no wheezes. She has no rales.  Abdominal: Soft. Bowel sounds are normal. She exhibits no distension. There is no tenderness. There is no rebound and no guarding.  Musculoskeletal: She exhibits no edema.  No tenderness to palpation over the back, midline or over paraspinal muscles. No pain with bilateral straight leg raise. Full range of motion of bilateral hips. Dorsal pedal pulses are equal and intact bilaterally.  Neurological: She is alert and oriented to person, place, and time.  Skin: Skin is warm and dry.  Psychiatric: She has a normal mood and affect. Her behavior is normal.  Nursing note and vitals reviewed.   ED Course  Procedures (including critical care time) Labs Review Labs Reviewed  CBC WITH DIFFERENTIAL/PLATELET - Abnormal; Notable for the following:    WBC 3.8 (*)    Monocytes Relative 14 (*)    All other components within normal limits   COMPREHENSIVE METABOLIC PANEL - Abnormal; Notable for the following:    CO2 21 (*)    BUN 72 (*)    Creatinine, Ser 13.61 (*)    Calcium 7.6 (*)    Albumin 2.8 (*)    ALT 13 (*)    GFR calc non Af Amer 3 (*)    GFR calc Af Amer 4 (*)    All other components within normal limits  I-STAT BETA HCG BLOOD, ED ([facility], [facility], [facility] ONLY)    Imaging Review No results found.   EKG Interpretation None      MDM   Final diagnoses:  Back muscle spasm   patient with lower back pain, I suspect she may be having spasms given pain only comes on when she is doing something and resolves when she is laying still. I cannot reduce the pain right now on my exam, however she states she has no pain at this time. She is neurovascularly intact. Her abdomen is benign and nontender. Lab work obtained and is unremarkable, with chronic elevation in BUN and creatinine.  Filed Vitals:   05/01/15 1429  BP: 138/106  Pulse: 76  Temp: 98.2 F (36.8 C)  TempSrc:  Oral  Resp: 18  Height: 5\' 4"  (1.626 m)  Weight: 112 lb 7 oz (51 kg)  SpO2: 95%       Jeannett Senior, PA-C 05/01/15 1738  [place] Ferguson, MD 05/02/15 1320

## 2015-05-01 NOTE — ED Notes (Signed)
Pt reports having dialysis yesterday, then had onset of abd cramping which is normal for her. When abd pain stopped, she then has pulsating lower back pain.

## 2015-06-06 ENCOUNTER — Encounter: Payer: Self-pay | Admitting: Internal Medicine

## 2015-06-14 ENCOUNTER — Other Ambulatory Visit: Payer: Self-pay | Admitting: *Deleted

## 2015-06-14 ENCOUNTER — Encounter (HOSPITAL_COMMUNITY): Payer: Self-pay | Admitting: Family Medicine

## 2015-06-14 ENCOUNTER — Emergency Department (HOSPITAL_COMMUNITY)
Admission: EM | Admit: 2015-06-14 | Discharge: 2015-06-14 | Disposition: A | Discharge status: Home / Self Care | Payer: Medicaid Other | Attending: Emergency Medicine | Admitting: Emergency Medicine

## 2015-06-14 ENCOUNTER — Emergency Department (HOSPITAL_COMMUNITY): Payer: Medicaid Other

## 2015-06-14 DIAGNOSIS — L0291 Cutaneous abscess, unspecified: Secondary | ICD-10-CM

## 2015-06-14 DIAGNOSIS — N63 Unspecified lump in unspecified breast: Secondary | ICD-10-CM

## 2015-06-14 DIAGNOSIS — N6320 Unspecified lump in the left breast, unspecified quadrant: Secondary | ICD-10-CM

## 2015-06-14 DIAGNOSIS — Z720 Tobacco use: Secondary | ICD-10-CM | POA: Diagnosis not present

## 2015-06-14 DIAGNOSIS — Z79899 Other long term (current) drug therapy: Secondary | ICD-10-CM | POA: Diagnosis not present

## 2015-06-14 DIAGNOSIS — Z992 Dependence on renal dialysis: Secondary | ICD-10-CM | POA: Insufficient documentation

## 2015-06-14 DIAGNOSIS — N61 Inflammatory disorders of breast: Secondary | ICD-10-CM | POA: Insufficient documentation

## 2015-06-14 DIAGNOSIS — N644 Mastodynia: Secondary | ICD-10-CM | POA: Diagnosis present

## 2015-06-14 DIAGNOSIS — Z872 Personal history of diseases of the skin and subcutaneous tissue: Secondary | ICD-10-CM | POA: Diagnosis not present

## 2015-06-14 DIAGNOSIS — N186 End stage renal disease: Secondary | ICD-10-CM | POA: Diagnosis not present

## 2015-06-14 MED ORDER — ACETAMINOPHEN 325 MG PO TABS
650.0000 mg | ORAL_TABLET | Freq: Once | ORAL | Status: AC
  Administered 2015-06-14: 650 mg via ORAL
  Filled 2015-06-14: qty 2

## 2015-06-14 MED ORDER — SULFAMETHOXAZOLE-TRIMETHOPRIM 800-160 MG PO TABS
1.0000 | ORAL_TABLET | Freq: Once | ORAL | Status: AC
  Administered 2015-06-14: 1 via ORAL
  Filled 2015-06-14: qty 1

## 2015-06-14 MED ORDER — TRAMADOL HCL 50 MG PO TABS
50.0000 mg | ORAL_TABLET | Freq: Four times a day (QID) | ORAL | Status: DC | PRN
Start: 2015-06-14 — End: 2015-06-24

## 2015-06-14 MED ORDER — IBUPROFEN 800 MG PO TABS: 800.0000 mg | ORAL_TABLET | Freq: Three times a day (TID) | ORAL | Status: DC

## 2015-06-14 MED ORDER — OXYCODONE-ACETAMINOPHEN 5-325 MG PO TABS
2.0000 | ORAL_TABLET | Freq: Once | ORAL | Status: AC
  Administered 2015-06-14: 2 via ORAL
  Filled 2015-06-14: qty 2

## 2015-06-14 MED ORDER — IBUPROFEN 200 MG PO TABS
600.0000 mg | ORAL_TABLET | Freq: Once | ORAL | Status: AC
  Administered 2015-06-14: 600 mg via ORAL
  Filled 2015-06-14: qty 3

## 2015-06-14 NOTE — Discharge Instructions (Signed)
Please call the [facility] Center at [phone] immediately on discharge  They will help you get your mamogram scheduled.

## 2015-06-14 NOTE — ED Provider Notes (Signed)
CSN: [id number]     Arrival date & time 06/14/15  [id number] History   First MD Initiated Contact with Patient 06/14/15 [phone]     Chief Complaint  Patient presents with  . Breast Pain     (Consider location/radiation/quality/duration/timing/severity/associated sxs/prior Treatment) HPI Comments: The patient is a 34 year old female, she has a history of end-stage renal disease on dialysis, lupus nephritis, history of approximately 2 weeks of gradual onset but persistent and gradually worsening swelling of the left breast in the periareolar area. This is tender to palpation, it is not associated with fevers or discharge or drainage from the nipple. She has not told her dialysis center about this.  The history is provided by the patient.    Past Medical History  Diagnosis Date  . Lupus (systemic lupus erythematosus)     Previously followed with Dr. Charlestine Night, has not followed up recently  . Lupus nephritis 2006    Renal biopsy shows segmental endocapillary proliferation and cellular crescent formation (Class IIIA) and lupus membranous glomerulopathy (Class V, stage II)  . S/P pericardiocentesis 01/17/2013    H/o pericardial effusion with tamponade 2006   . H/O pleural effusion 01/17/2013  . H/O pericarditis 01/17/2013  . Polysubstance abuse     cocaine, MJ, tobacco   Past Surgical History  Procedure Laterality Date  . Bascilic vein transposition Left 02/05/2014    Procedure: [place];  Surgeon: [name], MD;  Location: [place];  Service: Vascular;  Laterality: Left;  Annell Greening Right 01/31/2014    Procedure: DIALYSIS CATHETER;  Surgeon: Serafina Mitchell, MD;  [facility]: [facility] CATH LAB;  Service: Cardiovascular;  Laterality: Right;   No family history on file. History  Substance Use Topics  . Smoking status: Current Some Day Smoker -- 0.25 packs/day for 15 years  . Smokeless tobacco: Never Used     Comment: quit smoking s/p hospital discharge/ SMOKES ABOUT 2-3 CIGARETTES A DAY  .  Alcohol Use: Yes     Comment: "binge drinking" most weekends, per pt report   OB History    No data available     Review of Systems  Constitutional: Negative for fever.  Respiratory: Negative for shortness of breath.   Skin:       Swelling of left breast      Allergies  Tobramycin sulfate  Home Medications   Prior to Admission medications   Medication Sig Start Date End Date Taking? Authorizing Provider  amLODipine (NORVASC) 10 MG tablet Take 1 tablet (10 mg total) by mouth daily. 03/15/14  Yes Dorian Heckle, MD  calcium acetate (PHOSLO) 667 MG capsule Take 1,334 mg by mouth 3 (three) times daily with meals.   Yes Historical Provider, MD  cinacalcet (SENSIPAR) 30 MG tablet Take 30 mg by mouth daily.   Yes Historical Provider, MD  darbepoetin (ARANESP) 60 MCG/0.3ML SOLN injection Inject 0.3 mLs (60 mcg total) into the vein every Wednesday with hemodialysis. 02/08/14  Yes Jessee Avers, MD  diazepam (VALIUM) 5 MG tablet Take 1 tablet (5 mg total) by mouth every 8 (eight) hours as needed for muscle spasms. 05/01/15  Yes Tatyana Kirichenko, PA-C  multivitamin (RENA-VIT) TABS tablet Take 1 tablet by mouth daily.   Yes Historical Provider, MD  Naproxen Sodium (ALEVE) 220 MG CAPS Take 2 capsules by mouth 2 (two) times daily as needed (pain).   Yes Historical Provider, MD  Nutritional Supplements (FEEDING SUPPLEMENT, NEPRO CARB STEADY,) LIQD Take 237 mLs by mouth daily.   Yes Historical  Provider, MD  HYDROcodone-acetaminophen (NORCO/VICODIN) 5-325 MG per tablet Take 1 tablet by mouth every 6 (six) hours as needed for moderate pain. Patient not taking: Reported on 06/14/2015 12/25/14   Dalia Heading, PA-C  triamcinolone cream (KENALOG) 0.1 % Apply 1 application topically 2 (two) times daily. Patient not taking: Reported on 06/14/2015 12/25/14   Dalia Heading, PA-C   BP 138/84 mmHg  Pulse 89  Temp(Src) 98.2 F (36.8 C) (Oral)  Resp 16  SpO2 100% Physical Exam  Constitutional:  She appears well-developed and well-nourished.  HENT:  Head: Normocephalic and atraumatic.  Eyes: Conjunctivae are normal. Right eye exhibits no discharge. Left eye exhibits no discharge.  Cardiovascular:  Heart sounds normal  Pulmonary/Chest: Effort normal. No respiratory distress.  Lungs sounds normal. Chaperone present for exam, right breast with normal-appearing tissue, left breast with swelling around the nipple, tenderness, palpable mass  Neurological: She is alert. Coordination normal.  Skin: Skin is warm and dry. No rash noted. She is not diaphoretic. No erythema.  Psychiatric: She has a normal mood and affect.  Nursing note and vitals reviewed.   ED Course  Procedures (including critical care time) Labs Review Labs Reviewed - No data to display  Imaging Review US Breast Complete Uni Left Inc Axilla  06/14/2015   ADDENDUM REPORT: 06/14/2015 13:35  ADDENDUM: The results were not discussed with the patient since she is at another facility currently.   Electronically Signed   By: Claudie Revering M.D.   On: 06/14/2015 13:35   06/14/2015   CLINICAL DATA:  Inferior left breast tenderness and hardening for the past 2 weeks, spreading over a larger area. The patient reports that this is involving the entire breast today. No reported skin redness or increased warmth.  EXAM: ULTRASOUND OF THE LEFT BREAST  COMPARISON:  None.  FINDINGS: Targeted ultrasound is performed, showing a 1.8 x 1.7 x 1.2 cm oval, horizontally oriented, circumscribed, heterogeneous, medium echotexture mass in the lower inner retroareolar region. The technologist reported that this was adjacent to the nipple but not an inverted nipple. This has internal blood flow with color Doppler. Survey of the remainder the breast demonstrated no additional masses and no fluid collections.  IMPRESSION: 1.8 x 1.7 x 1.2 cm solid retroareolar mass on the left, as described above. This is nonspecific. Differential considerations include  fibroadenoma and malignancy.  RECOMMENDATION: Bilateral diagnostic mammogram and repeat left breast ultrasound at the time of diagnostic mammography.  I have discussed the findings and recommendations with the patient. Results were also provided in writing at the conclusion of the visit. If applicable, a reminder letter will be sent to the patient regarding the next appointment.  BI-RADS CATEGORY  0: Incomplete. Need additional imaging evaluation and/or prior mammograms for comparison.  Electronically Signed: By: Claudie Revering M.D. On: 06/14/2015 13:16     MDM   Final diagnoses:  Abscess    There is no change to the overlying skin to suggest a cellulitis but there is a very palpable mass and on a bedside ultrasound appears that this could be consistent with an abscess, formal ultrasound study has been ordered, and buttocks ordered, Tylenol for pain.  D/w surgery - they recommend f/u with the breast center -  D/w Breast Center, scheduled outpt studies including mammogram - stable for d/c. Pt expresses understanding.  Meds given in ED:  Medications  sulfamethoxazole-trimethoprim (BACTRIM DS,SEPTRA DS) 800-160 MG per tablet 1 tablet (1 tablet Oral Given 06/14/15 0802)  acetaminophen (TYLENOL) tablet 650 mg (  650 mg Oral Given 06/14/15 0802)  oxyCODONE-acetaminophen (PERCOCET/ROXICET) 5-325 MG per tablet 2 tablet (2 tablets Oral Given 06/14/15 1144)  ibuprofen (ADVIL,MOTRIN) tablet 600 mg (600 mg Oral Given 06/14/15 1433)      Noemi Chapel, MD 06/14/15 [id number]

## 2015-06-14 NOTE — ED Notes (Signed)
Pt presents from home via POV with c/o left sided lower breast tenderness and hardening.  Pt denies discharge from the nipple or erythema.  Dr. Sabra Heck at bedside with ultrasound. Pt is A&O x4.  Pt is Tue/Th/Sat Dialysis patient and access is on left upper arm.

## 2015-06-19 ENCOUNTER — Other Ambulatory Visit (HOSPITAL_COMMUNITY): Payer: Self-pay | Admitting: Emergency Medicine

## 2015-06-19 ENCOUNTER — Ambulatory Visit
Admission: RE | Admit: 2015-06-19 | Discharge: 2015-06-19 | Disposition: A | Discharge status: Home / Self Care | Payer: Medicaid Other | Source: Ambulatory Visit | Attending: Emergency Medicine | Admitting: Emergency Medicine

## 2015-06-19 DIAGNOSIS — N6320 Unspecified lump in the left breast, unspecified quadrant: Secondary | ICD-10-CM

## 2015-06-19 DIAGNOSIS — R599 Enlarged lymph nodes, unspecified: Secondary | ICD-10-CM

## 2015-06-24 ENCOUNTER — Encounter: Payer: Self-pay | Admitting: Internal Medicine

## 2015-06-24 ENCOUNTER — Other Ambulatory Visit (HOSPITAL_COMMUNITY): Payer: Self-pay | Admitting: Emergency Medicine

## 2015-06-24 ENCOUNTER — Ambulatory Visit (INDEPENDENT_AMBULATORY_CARE_PROVIDER_SITE_OTHER): Payer: Medicaid Other | Admitting: Internal Medicine

## 2015-06-24 VITALS — BP 142/98 | HR 45 | Temp 98.0°F | Ht 65.0 in | Wt 119.7 lb

## 2015-06-24 DIAGNOSIS — Z720 Tobacco use: Secondary | ICD-10-CM

## 2015-06-24 DIAGNOSIS — M329 Systemic lupus erythematosus, unspecified: Secondary | ICD-10-CM | POA: Diagnosis not present

## 2015-06-24 DIAGNOSIS — I7300 Raynaud's syndrome without gangrene: Secondary | ICD-10-CM

## 2015-06-24 DIAGNOSIS — R21 Rash and other nonspecific skin eruption: Secondary | ICD-10-CM | POA: Diagnosis not present

## 2015-06-24 DIAGNOSIS — F17210 Nicotine dependence, cigarettes, uncomplicated: Secondary | ICD-10-CM

## 2015-06-24 DIAGNOSIS — Z992 Dependence on renal dialysis: Secondary | ICD-10-CM | POA: Diagnosis not present

## 2015-06-24 DIAGNOSIS — N6320 Unspecified lump in the left breast, unspecified quadrant: Secondary | ICD-10-CM

## 2015-06-24 DIAGNOSIS — N186 End stage renal disease: Secondary | ICD-10-CM

## 2015-06-24 DIAGNOSIS — I120 Hypertensive chronic kidney disease with stage 5 chronic kidney disease or end stage renal disease: Secondary | ICD-10-CM

## 2015-06-24 MED ORDER — HYDROXYCHLOROQUINE SULFATE 200 MG PO TABS: 400.0000 mg | ORAL_TABLET | Freq: Every day | ORAL | Status: DC

## 2015-06-24 MED ORDER — TRIAMCINOLONE ACETONIDE 0.1 % EX CREA: 1.0000 "application " | TOPICAL_CREAM | Freq: Two times a day (BID) | CUTANEOUS | Status: DC

## 2015-06-24 MED ORDER — AMLODIPINE BESYLATE 5 MG PO TABS: ORAL_TABLET | ORAL | Status: DC

## 2015-06-24 NOTE — Assessment & Plan Note (Signed)
-   Discussed importance of smoking cessation. - Keeping hands warm, strategies discussed. - She is not currently taking amlodipine, as her BP is elevated I advised to restart amlodipine 5mg  on non dialysis days.

## 2015-06-24 NOTE — Assessment & Plan Note (Signed)
-   Follows with Dr Joelyn Oms at [facility] she is not able to tolerate full treatment sessions and leaves 2-2.5hours through treatment.

## 2015-06-24 NOTE — Patient Instructions (Signed)
General Instructions:  Please start amlodipine and hydroxychlorquine as we discussed. Please bring your medicines with you each time you come to clinic.  Medicines may include prescription medications, over-the-counter medications, herbal remedies, eye drops, vitamins, or other pills.   Progress Toward Treatment Goals:  Treatment Goal 03/15/2014  Stop smoking smoking less    Self Care Goals & Plans:  Self Care Goal 06/24/2015  Manage my medications take my medicines as prescribed; bring my medications to every visit; refill my medications on time  Monitor my health keep track of my blood glucose  Eat healthy foods -  Be physically active (No Data)  Stop smoking (No Data)    No flowsheet data found.   Care Management & Community Referrals:  No flowsheet data found.

## 2015-06-24 NOTE — Assessment & Plan Note (Addendum)
-  BP elevated today, given her raynauds I will start her back on Amlodipine 5mg  to be taken on non dialysis days.

## 2015-06-24 NOTE — Progress Notes (Addendum)
[facility] Subjective:   Patient ID: [name] female   DOB: [birth year] 34 y.o.   MRN: [id number]  HPI: [name] is a 34 y.o. female with a PMH detailed below who presents for overdue follow up. She started on dialysis over a year ago and since that time it she has not been good about following up with our clinic.  She reports that dialysis has been difficult and that she usually signs off early after about 2.5 hours.  She is doing dialysis at [facility] and she reports her Nephrologist is Dr Joelyn Oms.  She notes that dialysis has frequently caused her some abdominal pain and alternating constipation and diarrhea after episodes.  She does complain of a diffuse rash today over much of her upper body, she reports that it does not itch and that it has been present for about a month.  She also reports she has frequently had finger pain.  Associated with this she has noticed that her fingers can turn purple.  She did recently go to the ED for a left breast mass, she did go for follow up at the breast center and had a mammogram and [name] which was birads caragory 4 and she is scheduled fora US guided core biopsy  Past Medical History  Diagnosis Date  . Lupus (systemic lupus erythematosus)     Previously followed with Dr. Charlestine Night, has not followed up recently  . Lupus nephritis 2006    Renal biopsy shows segmental endocapillary proliferation and cellular crescent formation (Class IIIA) and lupus membranous glomerulopathy (Class V, stage II)  . S/P pericardiocentesis 01/17/2013    H/o pericardial effusion with tamponade 2006   . H/O pleural effusion 01/17/2013  . H/O pericarditis 01/17/2013  . Polysubstance abuse     cocaine, MJ, tobacco  . 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 amLODipine (NORVASC) 5 MG tablet Take 5mg  daily on non dialysis days 45 tablet 1  . calcium  acetate (PHOSLO) 667 MG capsule Take 1,334 mg by mouth 3 (three) times daily with meals.    . cinacalcet (SENSIPAR) 30 MG tablet Take 30 mg by mouth daily.    . darbepoetin (ARANESP) 60 MCG/0.3ML SOLN injection Inject 0.3 mLs (60 mcg total) into the vein every Wednesday with hemodialysis. 4.2 mL 0  . hydroxychloroquine (PLAQUENIL) 200 MG tablet Take 2 tablets (400 mg total) by mouth daily. 60 tablet 2  . ibuprofen (ADVIL,MOTRIN) 800 MG tablet Take 1 tablet (800 mg total) by mouth 3 (three) times daily. 21 tablet 0  . multivitamin (RENA-VIT) TABS tablet Take 1 tablet by mouth daily.    . Naproxen Sodium (ALEVE) 220 MG CAPS Take 2 capsules by mouth 2 (two) times daily as needed (pain).    . Nutritional Supplements (FEEDING SUPPLEMENT, NEPRO CARB STEADY,) LIQD Take 237 mLs by mouth daily.    Marland Kitchen triamcinolone cream (KENALOG) 0.1 % Apply 1 application topically 2 (two) times daily. 30 g 0   No current facility-administered medications for this visit.   No family history on file. History   Social History  . Marital Status: Single    Spouse Name: N/A  . Number of Children: N/A  . Years of Education: N/A   Social History Main Topics  . Smoking status: Current Some Day Smoker -- 0.25 packs/day for 15 years  . Smokeless tobacco: Never Used     Comment: quit smoking  s/p hospital discharge/ SMOKES ABOUT 2-3 CIGARETTES A DAY  . Alcohol Use: Yes     Comment: "binge drinking" most weekends, per pt report  . Drug Use: 5.00 per week    Special: Marijuana, Cocaine  . Sexual Activity: Yes   Other Topics Concern  . None   Social History Narrative   Review of Systems: Review of Systems  Constitutional: Negative for fever, chills and malaise/fatigue.  Respiratory: Negative for cough and shortness of breath.   Cardiovascular: Negative for chest pain.  Gastrointestinal: Positive for abdominal pain, diarrhea and constipation. Negative for blood in stool and melena.  Genitourinary: Negative for  dysuria.  Musculoskeletal: Negative for myalgias and joint pain.  Skin: Positive for rash. Negative for itching.  Neurological: Positive for headaches. Negative for dizziness.     Objective:  Physical Exam: Filed Vitals:   06/24/15 1423  BP: 142/98  Pulse: 45  Temp: 98 F (36.7 C)  TempSrc: Oral  Height: 5\' 5"  (1.651 m)  Weight: 119 lb 11.2 oz (54.296 kg)  SpO2: 72%  Physical Exam  Constitutional: She is well-developed, well-nourished, and in no distress.  HENT:  Head: Normocephalic and atraumatic.  Cardiovascular: Normal rate and regular rhythm.  Exam reveals no friction rub.   No murmur heard. Pulmonary/Chest: Effort normal and breath sounds normal.  Abdominal: Soft. Bowel sounds are normal. She exhibits no distension. There is no tenderness.  Musculoskeletal:  Her finger tips are cool to the touch with some purplish discoloration. Otherwise no acute inflamed joints on her hands, wrists, elbows or knees.  Skin: Skin is warm and dry. Rash (diffuse dry flaky rash, see picture below) noted.  Psychiatric: Affect normal.     Hands  Rash on chest and arms.  Assessment & Plan:  Case discussed with Dr. Lynnae January  Secondary Raynaud's phenomenon - Discussed importance of smoking cessation. - Keeping hands warm, strategies discussed. - She is not currently taking amlodipine, as her BP is elevated I advised to restart amlodipine 5mg  on non dialysis days.  ESRD on dialysis - Follows with Dr Joelyn Oms at [facility] she is not able to tolerate full treatment sessions and leaves 2-2.5hours through treatment.  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  SLE (systemic lupus erythematosus) - I believe her rash may be a manifestation on her Lupus, she has never been able to get on a stable lupus regimen as she has not followed up with our clinic very well or  the rheumatologist. - I will start her on Plaquenil 200mg  BID, she will need an eye exam and I have placed a referral to Optometry.  She will then need further eye exams yearly while on this medication.  Tobacco abuse - She has cut down, I strongly encouraged cessation given her raynauds.  Rash and nonspecific skin eruption I suspect her rash may be a manifestation of her Lupus. I am starting her on Plaquenil. - Will also Rx Triamcinolone cream.    Medications Ordered Meds ordered this encounter  Medications  . triamcinolone cream (KENALOG) 0.1 %    Sig: Apply 1 application topically 2 (two) times daily.    Dispense:  30 g    Refill:  0  . amLODipine (NORVASC) 5 MG tablet    Sig: Take 5mg  daily on non dialysis days    Dispense:  45 tablet    Refill:  1  . hydroxychloroquine (PLAQUENIL) 200 MG tablet    Sig: Take 2 tablets (400 mg total) by mouth daily.    Dispense:  60 tablet    Refill:  2   Other Orders Orders Placed This Encounter  Procedures  . Ambulatory referral to Optometry    Referral Priority:  Routine    Referral Type:  [facility] planner)    Referral Reason:  Specialty Services Required    Requested Specialty:  Optometry    Number of Visits Requested:  1   Follow Up: Return 2 months.

## 2015-06-26 ENCOUNTER — Ambulatory Visit
Admission: RE | Admit: 2015-06-26 | Discharge: 2015-06-26 | Disposition: A | Discharge status: Home / Self Care | Payer: Medicaid Other | Source: Ambulatory Visit | Attending: Emergency Medicine | Admitting: Emergency Medicine

## 2015-06-26 DIAGNOSIS — R599 Enlarged lymph nodes, unspecified: Secondary | ICD-10-CM

## 2015-06-26 DIAGNOSIS — N6320 Unspecified lump in the left breast, unspecified quadrant: Secondary | ICD-10-CM

## 2015-06-26 DIAGNOSIS — R21 Rash and other nonspecific skin eruption: Secondary | ICD-10-CM | POA: Insufficient documentation

## 2015-06-26 NOTE — Progress Notes (Signed)
Internal Medicine Clinic Attending  Case discussed with Dr. Hoffman soon after the resident saw the patient.  We reviewed the resident's history and exam and pertinent patient test results.  I agree with the assessment, diagnosis, and plan of care documented in the resident's note. 

## 2015-06-26 NOTE — Assessment & Plan Note (Signed)
I suspect her rash may be a manifestation of her Lupus. I am starting her on Plaquenil. - Will also Rx Triamcinolone cream.

## 2015-06-26 NOTE — Assessment & Plan Note (Signed)
-   I believe her rash may be a manifestation on her Lupus, she has never been able to get on a stable lupus regimen as she has not followed up with our clinic very well or the rheumatologist. - I will start her on Plaquenil 200mg  BID, she will need an eye exam and I have placed a referral to Optometry.  She will then need further eye exams yearly while on this medication.

## 2015-06-26 NOTE — Assessment & Plan Note (Signed)
-   She has cut down, I strongly encouraged cessation given her raynauds.

## 2015-08-15 ENCOUNTER — Encounter: Payer: Medicaid Other | Admitting: Internal Medicine

## 2015-08-15 ENCOUNTER — Ambulatory Visit: Payer: Medicaid Other | Admitting: Internal Medicine

## 2015-09-05 ENCOUNTER — Emergency Department (HOSPITAL_COMMUNITY): Payer: Medicaid Other

## 2015-09-05 ENCOUNTER — Emergency Department (HOSPITAL_COMMUNITY)
Admission: EM | Admit: 2015-09-05 | Discharge: 2015-09-05 | Disposition: A | Discharge status: Home / Self Care | Payer: Medicaid Other | Attending: Emergency Medicine | Admitting: Emergency Medicine

## 2015-09-05 ENCOUNTER — Encounter (HOSPITAL_COMMUNITY): Payer: Self-pay

## 2015-09-05 DIAGNOSIS — Z7952 Long term (current) use of systemic steroids: Secondary | ICD-10-CM | POA: Diagnosis not present

## 2015-09-05 DIAGNOSIS — Z9115 Patient's noncompliance with renal dialysis: Secondary | ICD-10-CM | POA: Insufficient documentation

## 2015-09-05 DIAGNOSIS — Z9889 Other specified postprocedural states: Secondary | ICD-10-CM | POA: Insufficient documentation

## 2015-09-05 DIAGNOSIS — Z8739 Personal history of other diseases of the musculoskeletal system and connective tissue: Secondary | ICD-10-CM | POA: Diagnosis not present

## 2015-09-05 DIAGNOSIS — Z720 Tobacco use: Secondary | ICD-10-CM | POA: Diagnosis not present

## 2015-09-05 DIAGNOSIS — I120 Hypertensive chronic kidney disease with stage 5 chronic kidney disease or end stage renal disease: Secondary | ICD-10-CM | POA: Diagnosis not present

## 2015-09-05 DIAGNOSIS — R000 Tachycardia, unspecified: Secondary | ICD-10-CM | POA: Diagnosis not present

## 2015-09-05 DIAGNOSIS — Z79899 Other long term (current) drug therapy: Secondary | ICD-10-CM | POA: Diagnosis not present

## 2015-09-05 DIAGNOSIS — R0602 Shortness of breath: Secondary | ICD-10-CM | POA: Diagnosis present

## 2015-09-05 DIAGNOSIS — N186 End stage renal disease: Secondary | ICD-10-CM | POA: Insufficient documentation

## 2015-09-05 DIAGNOSIS — R011 Cardiac murmur, unspecified: Secondary | ICD-10-CM | POA: Diagnosis not present

## 2015-09-05 DIAGNOSIS — J90 Pleural effusion, not elsewhere classified: Secondary | ICD-10-CM | POA: Diagnosis not present

## 2015-09-05 LAB — CBC WITH DIFFERENTIAL/PLATELET
Basophils Absolute: 0 10*3/uL (ref 0.0–0.1)
Basophils Relative: 0 % (ref 0–1)
EOS PCT: 4 % (ref 0–5)
Eosinophils Absolute: 0.2 10*3/uL (ref 0.0–0.7)
HCT: 36.8 % (ref 36.0–46.0)
HEMOGLOBIN: 11.3 g/dL — AB (ref 12.0–15.0)
LYMPHS ABS: 0.9 10*3/uL (ref 0.7–4.0)
Lymphocytes Relative: 18 % (ref 12–46)
MCH: 27.3 pg (ref 26.0–34.0)
MCHC: 30.7 g/dL (ref 30.0–36.0)
MCV: 88.9 fL (ref 78.0–100.0)
Monocytes Absolute: 0.4 10*3/uL (ref 0.1–1.0)
Monocytes Relative: 7 % (ref 3–12)
NEUTROS PCT: 71 % (ref 43–77)
Neutro Abs: 3.6 10*3/uL (ref 1.7–7.7)
Platelets: 183 10*3/uL (ref 150–400)
RBC: 4.14 MIL/uL (ref 3.87–5.11)
RDW: 16.9 % — ABNORMAL HIGH (ref 11.5–15.5)
WBC: 5.1 10*3/uL (ref 4.0–10.5)

## 2015-09-05 LAB — I-STAT CHEM 8, ED
BUN: 103 mg/dL — AB (ref 6–20)
CHLORIDE: 104 mmol/L (ref 101–111)
Calcium, Ion: 0.9 mmol/L — ABNORMAL LOW (ref 1.12–1.23)
Creatinine, Ser: 15.6 mg/dL — ABNORMAL HIGH (ref 0.44–1.00)
GLUCOSE: 73 mg/dL (ref 65–99)
HCT: 37 % (ref 36.0–46.0)
Hemoglobin: 12.6 g/dL (ref 12.0–15.0)
Potassium: 5.3 mmol/L — ABNORMAL HIGH (ref 3.5–5.1)
Sodium: 138 mmol/L (ref 135–145)
TCO2: 21 mmol/L (ref 0–100)

## 2015-09-05 LAB — I-STAT TROPONIN, ED: Troponin i, poc: 0.07 ng/mL (ref 0.00–0.08)

## 2015-09-05 MED ORDER — DARBEPOETIN ALFA 150 MCG/0.3ML IJ SOSY: 150.0000 ug | PREFILLED_SYRINGE | INTRAMUSCULAR | Status: DC

## 2015-09-05 MED ORDER — AZITHROMYCIN 250 MG PO TABS: 500.0000 mg | ORAL_TABLET | Freq: Once | ORAL | Status: DC

## 2015-09-05 MED ORDER — SODIUM CHLORIDE 0.9 % IV SOLN: 125.0000 mg | INTRAVENOUS | Status: DC

## 2015-09-05 MED ORDER — CALCITRIOL 0.5 MCG PO CAPS: 1.0000 ug | ORAL_CAPSULE | ORAL | Status: DC

## 2015-09-05 NOTE — ED Notes (Signed)
Pt from home via pov, pt states that she began having sob last evening around 1900 that got worse when she laid down. Pt is dialysis pt and has dialysis tues-thur-sat but missed Tuesday. Pt feels like she is in fluid overload. Pt denies chest pain. Pt having difficulty completing sentences. SPO2 on RA 94%.

## 2015-09-05 NOTE — ED Provider Notes (Signed)
CSN: [id number]     Arrival date & time 09/05/15  0556 History   First MD Initiated Contact with Patient 09/05/15 0617     Chief Complaint  Patient presents with  . Shortness of Breath     (Consider location/radiation/quality/duration/timing/severity/associated sxs/prior Treatment) HPI   34 year old female with history of end-stage renal disease, currently on Tuesday Thursday Saturday dialysis, history of lupus, polysubstance abuse, history of pleural effusion presenting for evaluation of shortness of breath. Patient admits that she is noncompliant with her dialysis session has been on it for the past 2 years. She missed her Tuesday dialysis due to having family activities. She reports gradual onset of increased short of breath worsening with laying down and shortness of breath has been persistent. She tries home remedy including soaking feet in Epsom salt without relief.  Patient attributes her worsening shortness of breath due to missing her dialysis. She also endorsed having productive cough for the same duration. Report having mild diarrhea for the past 2 days. She reports feeling hot but denies having fever, chills, headache, hemoptysis, chest pain, abdominal pain, nausea vomiting diarrhea. She denies any recreational drug use aside from marijuana. Denies any risk factors for PE.      Past Medical History  Diagnosis Date  . Lupus (systemic lupus erythematosus)     Previously followed with Dr. Charlestine Night, has not followed up recently  . Lupus nephritis 2006    Renal biopsy shows segmental endocapillary proliferation and cellular crescent formation (Class IIIA) and lupus membranous glomerulopathy (Class V, stage II)  . S/P pericardiocentesis 01/17/2013    H/o pericardial effusion with tamponade 2006   . H/O pleural effusion 01/17/2013  . H/O pericarditis 01/17/2013  . Polysubstance abuse     cocaine, MJ, tobacco  . Benign hypertension with ESRD (end-stage renal disease) 03/15/2014   Past  Surgical History  Procedure Laterality Date  . Bascilic vein transposition Left 02/05/2014    Procedure: [place];  Surgeon: [name], MD;  Location: [facility];  Service: Vascular;  Laterality: Left;  Annell Greening Right 01/31/2014    Procedure: DIALYSIS CATHETER;  Surgeon: Serafina Mitchell, MD;  Location: [facility] C[facility];  Service: Cardiovascular;  Laterality: Right;   History reviewed. No pertinent family history. Social History  Substance Use Topics  . Smoking status: Current Some Day Smoker -- 0.25 packs/day for 15 years  . Smokeless tobacco: Never Used     Comment: quit smoking s/p hospital discharge/ SMOKES ABOUT 2-3 CIGARETTES A DAY  . Alcohol Use: Yes     Comment: occ   OB History    No data available     Review of Systems  All other systems reviewed and are negative.     Allergies  Tobramycin sulfate  Home Medications   Prior to Admission medications   Medication Sig Start Date End Date Taking? Authorizing Provider  amLODipine (NORVASC) 5 MG tablet Take 5mg  daily on non dialysis days 06/24/15   Lucious Groves, DO  calcium acetate (PHOSLO) 667 MG capsule Take 1,334 mg by mouth 3 (three) times daily with meals.    Historical Provider, MD  calcium acetate (PHOSLO) 667 MG capsule Take 3 capsules by mouth 3 (three) times daily. 06/25/15   Historical Provider, MD  cinacalcet (SENSIPAR) 30 MG tablet Take 30 mg by mouth daily.    Historical Provider, MD  darbepoetin (ARANESP) 60 MCG/0.3ML SOLN injection Inject 0.3 mLs (60 mcg total) into the vein every Wednesday with hemodialysis. 02/08/14  Jessee Avers, MD  hydroxychloroquine (PLAQUENIL) 200 MG tablet Take 2 tablets (400 mg total) by mouth daily. 06/24/15   Lucious Groves, DO  ibuprofen (ADVIL,MOTRIN) 800 MG tablet Take 1 tablet (800 mg total) by mouth 3 (three) times daily. 06/14/15   Noemi Chapel, MD  multivitamin (RENA-VIT) TABS tablet Take 1 tablet by mouth daily.    Historical Provider, MD  Naproxen Sodium  (ALEVE) 220 MG CAPS Take 2 capsules by mouth 2 (two) times daily as needed (pain).    Historical Provider, MD  Nutritional Supplements (FEEDING SUPPLEMENT, NEPRO CARB STEADY,) LIQD Take 237 mLs by mouth daily.    Historical Provider, MD  triamcinolone cream (KENALOG) 0.1 % Apply 1 application topically 2 (two) times daily. 06/24/15   Lucious Groves, DO   BP 158/124 mmHg  Pulse 97  Temp(Src) 98 F (36.7 C) (Oral)  Resp 34  Ht 5\' 4"  (1.626 m)  Wt 112 lb 10.5 oz (51.1 kg)  BMI 19.33 kg/m2  SpO2 94%  LMP 08/29/2015 (Exact Date) Physical Exam  Constitutional: She is oriented to person, place, and time. She appears well-developed and well-nourished. No distress.  African-American female, appeared in mild respiratory discomfort.  HENT:  Head: Atraumatic.  Eyes: Conjunctivae are normal.  Neck: Neck supple. No JVD present.  Cardiovascular:  Tachycardia with harsh systolic murmur best heard to the third and fourth left intercostal space.  Pulmonary/Chest: She is in respiratory distress (mild tachypneic and tachycardic). She has rales (Faint crackles heard at the base of lungs.).  Abdominal: Soft. There is no tenderness.  Musculoskeletal: She exhibits no edema.  Neurological: She is alert and oriented to person, place, and time.  Skin: No rash noted.  L arm AV fistula with palpable thrills and bruits.    Psychiatric: She has a normal mood and affect.  Nursing note and vitals reviewed.   ED Course  Procedures (including critical care time)  Patient here with shortness of breath worse with laying down likely suggestive of pleural effusion secondary to missed dialysis. Workup initiated.  7:43 AM Chest x-ray with evidence of pulmonary edema and worsening bilateral pleural effusion. Airspace consolidation in the right middle lobe cannot be excluded.  Patient does report having productive cough and feeling hot, therefore she may need antibiotic to cover for possible underlying lung infection.  She would need to be dialyzed today. Her potassium level is elevated at 5.3 with EKG showing mild PT waves. This will separately improve after appropriate dialysis. Plan to consult dialysis for further treatment. At this time I have low suspicion for PE or MI as a cause. No active chest pain.  Care discussed with my attending.    7:51 AM Appreciate consultation from nephrologist, Dr. Clover Mealy who felt that patient will best benefit from having her dialysis today. She's aware that pt is tachypneic and tachycardic but felt it should improve with dialysis.  She did call the dialysis office and able to schedule an appointment today for her dialysis.  Dr. Clover Mealy does not think pt will need to be treated for pna as it is less likely.  Pt is aware of plan.  She will be d/c to go to her dialysis.    Labs Review Labs Reviewed  I-STAT CHEM 8, ED - Abnormal; Notable for the following:    Potassium 5.3 (*)    BUN 103 (*)    Creatinine, Ser 15.60 (*)    Calcium, Ion 0.90 (*)    All other components within normal limits  CBC WITH DIFFERENTIAL/PLATELET  Randolm Idol, ED    Imaging Review Dg Chest 2 View  09/05/2015   CLINICAL DATA:  Shortness of breath and cough. Patient missed dialysis session.  EXAM: CHEST  2 VIEW  COMPARISON:  11/03/2014  FINDINGS: Cardiomediastinal silhouette is normal. Mediastinal contours appear intact.  There is no evidence of pneumothorax. There are bilateral pleural effusions, larger on the left, worsened from the prior radiograph. There is an associated bibasilar atelectasis. Pulmonary vasculature is prominent. More confluent airspace consolidation in the right middle lobe cannot be excluded.  Osseous structures are without acute abnormality. Soft tissues are grossly normal.  IMPRESSION: Pulmonary edema.  Worsened bilateral pleural effusions, with associated subsegmental atelectasis of the lower lobes.  Airspace consolidation of the right middle lobe cannot be excluded.    Electronically Signed   By: Fidela Salisbury M.D.   On: 09/05/2015 07:29   I have personally reviewed and evaluated these images and lab results as part of my medical decision-making.   EKG Interpretation None     ED ECG REPORT   Date: 09/05/2015  Rate: 113  Rhythm: sinus tachycardia  QRS Axis: normal  Intervals: QT prolonged  ST/T Wave abnormalities: normal  Conduction Disutrbances:none  Narrative Interpretation:   Old EKG Reviewed: unchanged  I have personally reviewed the EKG tracing and agree with the computerized printout as noted.   MDM   Final diagnoses:  Dialysis patient, noncompliant  Pleural effusion    BP 164/112 mmHg  Pulse 109  Temp(Src) 98 F (36.7 C) (Oral)  Resp 23  Ht 5\' 4"  (1.626 m)  Wt 112 lb 10.5 oz (51.1 kg)  BMI 19.33 kg/m2  SpO2 98%  LMP 08/29/2015 (Exact Date)  I have reviewed nursing notes and vital signs. I personally viewed the imaging tests through PACS system and agrees with radiologist's intepretation I reviewed available ER/hospitalization records through the EMR     Domenic Moras, PA-C 09/05/15 [place], MD 09/05/15 [id number]

## 2015-09-05 NOTE — Discharge Instructions (Signed)
Your symptoms are due to missing dialysis. Please go straight to the dialysis center today for further treatment. Follow-up closely with her doctor for further care.   Pleural Effusion The lining covering your lungs and the inside of your chest is called the pleura. Usually, the space between the two pleura contains no air and only a thin layer of fluid. A pleural effusion is an abnormal buildup of fluid in the pleural space. Fluid gathers when there is increased pressure in the lung vessels. This forces fluids out of the lungs and into the pleural space. Vessels may also leak fluids when there are infections, such as pneumonia, or other causes of soreness and redness (inflammation). Fluids leak into the lungs when protein in the blood is low or when certain vessels (lymphatics) are blocked. Finding a pleural effusion is important because it is usually caused by another disease. In order to treat a pleural effusion, your health care provider needs to find its cause. If left untreated, a large amount of fluid can build up and cause collapse of the lung. CAUSES   Heart failure.  Infections (pneumonia, tuberculosis), pulmonary embolism, pulmonary infarction.  Cancer (primary lung and metastatic), asbestosis.  Liver failure (cirrhosis).  Nephrotic syndrome, peritoneal dialysis, kidney problems (uremia).  Collagen vascular disease (systemic lupus erythematosus, rheumatoid arthritis).  Injury (trauma) to the chest or rupture of the digestive tube (esophagus).  Material in the chest or pleural space (hemothorax, chylothorax).  Pancreatitis.  Surgery.  Drug reactions. SYMPTOMS  A pleural effusion can decrease the amount of space available for breathing and make you short of breath. The fluid can become infected, which may cause pain and fever. Often, the pain is worse when taking a deep breath. The underlying disease (heart failure, pneumonia, blood clot, tuberculosis, cancer) may also cause  symptoms. DIAGNOSIS   Your health care provider can usually tell what is wrong by talking to you (taking a history), doing an exam, and taking a routine X-ray. If the X-ray shows fluid in your chest, often fluid is removed from your chest with a needle for testing (diagnostic thoracentesis).  Sometimes, more specialized X-rays may be needed.  Sometimes, a small piece of tissue is removed and examined by a specialist (biopsy). TREATMENT  Treatment varies based on what caused the pleural effusion. Treatments include:  Removing as much fluid as possible using a needle (thoracentesis) to improve the cough and shortness of breath. This is a simple procedure that can be done at bedside. The risks are bleeding, infection, collapse of a lung, or low blood pressure.  Placing a tube in the chest to drain the effusion (tube thoracostomy). This is often used when there is an infection in the fluid. This is a simple procedure that can often be done at bedside or in a clinic. The procedure may be painful. The risks are the same as using a needle to drain the fluid. The chest tube usually remains for a few days and is connected to suction to improve fluid drainage. After placement, the tube usually does not cause much discomfort.  Surgical removal of fibrous debris in and around the pleural space (decortication). This may be done with a flexible telescope (thoracoscope) through a small or large cut (incision). This is helpful for patients who have fibrosis or scar tissue that prevents complete lung expansion. The risks are infection, blood loss, and side effects from general anesthesia.  Sometimes, a procedure called pleurodesis is done. A chest tube is placed and the fluid  is drained. Next, an agent (tetracycline, talc powder) is added to the pleural space. This causes the lung and chest wall to stick together (adhesion). This leaves no potential space for fluid to build up. The risks include infection, blood  loss, and side effects from general anesthesia.  If the effusion is caused by infection, it may be treated with antibiotics and may improve without draining. HOME CARE INSTRUCTIONS   Take any medicines exactly as prescribed.  Follow up with your health care provider as directed.  Monitor your exercise capacity (the amount of walking you can do before you get short of breath).  Do not use any tobacco products including cigarettes, chewing tobacco, or electronic cigarettes. SEEK MEDICAL CARE IF:   Your exercise capacity seems to get worse or does not improve with time.  You do not recover from your illness.  You have drainage, redness, swelling, or pain at any incision or puncture sites. SEEK IMMEDIATE MEDICAL CARE IF:   Shortness of breath or chest pain develops or gets worse.  You have a fever.  You develop a new cough, especially if the mucus (phlegm) is discolored. MAKE SURE YOU:   Understand these instructions.  Will watch your condition.  Will get help right away if you are not doing well or get worse. Document Released: 12/14/2005 Document Revised: 04/30/2014 Document Reviewed: 08/05/2007 [facility] Patient Information 2015 [place], [place]. This information is not intended to replace advice given to you by your health care provider. Make sure you discuss any questions you have with your health care provider.

## 2015-09-25 ENCOUNTER — Emergency Department (HOSPITAL_COMMUNITY): Payer: Medicaid Other

## 2015-09-25 ENCOUNTER — Observation Stay (HOSPITAL_COMMUNITY)
Admission: EM | Admit: 2015-09-25 | Discharge: 2015-09-26 | Disposition: A | Discharge status: Home / Self Care | Payer: Medicaid Other | Attending: Student in an Organized Health Care Education/Training Program | Admitting: Student in an Organized Health Care Education/Training Program

## 2015-09-25 ENCOUNTER — Encounter (HOSPITAL_COMMUNITY): Payer: Self-pay | Admitting: *Deleted

## 2015-09-25 DIAGNOSIS — N390 Urinary tract infection, site not specified: Secondary | ICD-10-CM | POA: Diagnosis not present

## 2015-09-25 DIAGNOSIS — M329 Systemic lupus erythematosus, unspecified: Secondary | ICD-10-CM | POA: Insufficient documentation

## 2015-09-25 DIAGNOSIS — Z8709 Personal history of other diseases of the respiratory system: Secondary | ICD-10-CM | POA: Insufficient documentation

## 2015-09-25 DIAGNOSIS — E875 Hyperkalemia: Secondary | ICD-10-CM | POA: Diagnosis not present

## 2015-09-25 DIAGNOSIS — N186 End stage renal disease: Secondary | ICD-10-CM | POA: Diagnosis not present

## 2015-09-25 DIAGNOSIS — Z79899 Other long term (current) drug therapy: Secondary | ICD-10-CM | POA: Diagnosis not present

## 2015-09-25 DIAGNOSIS — Z720 Tobacco use: Secondary | ICD-10-CM | POA: Insufficient documentation

## 2015-09-25 DIAGNOSIS — I120 Hypertensive chronic kidney disease with stage 5 chronic kidney disease or end stage renal disease: Secondary | ICD-10-CM | POA: Diagnosis not present

## 2015-09-25 DIAGNOSIS — R32 Unspecified urinary incontinence: Secondary | ICD-10-CM | POA: Diagnosis present

## 2015-09-25 LAB — BASIC METABOLIC PANEL
Anion gap: 17 — ABNORMAL HIGH (ref 5–15)
BUN: 88 mg/dL — ABNORMAL HIGH (ref 6–20)
CO2: 20 mmol/L — ABNORMAL LOW (ref 22–32)
Calcium: 8 mg/dL — ABNORMAL LOW (ref 8.9–10.3)
Chloride: 98 mmol/L — ABNORMAL LOW (ref 101–111)
Creatinine, Ser: 14.57 mg/dL — ABNORMAL HIGH (ref 0.44–1.00)
GFR calc Af Amer: 3 mL/min — ABNORMAL LOW (ref >60)
GFR calc non Af Amer: 3 mL/min — ABNORMAL LOW (ref >60)
Glucose, Bld: 81 mg/dL (ref 65–99)
Potassium: 6.1 mmol/L (ref 3.5–5.1)
Sodium: 135 mmol/L (ref 135–145)

## 2015-09-25 LAB — CBC WITH DIFFERENTIAL/PLATELET
Basophils Absolute: 0 10*3/uL (ref 0.0–0.1)
Basophils Relative: 0 %
Eosinophils Absolute: 0.1 10*3/uL (ref 0.0–0.7)
Eosinophils Relative: 2 %
HEMATOCRIT: 37.6 % (ref 36.0–46.0)
Hemoglobin: 11.5 g/dL — ABNORMAL LOW (ref 12.0–15.0)
LYMPHS PCT: 13 %
Lymphs Abs: 0.8 10*3/uL (ref 0.7–4.0)
MCH: 27.5 pg (ref 26.0–34.0)
MCHC: 30.6 g/dL (ref 30.0–36.0)
MCV: 90 fL (ref 78.0–100.0)
MONO ABS: 0.4 10*3/uL (ref 0.1–1.0)
MONOS PCT: 7 %
NEUTROS ABS: 5 10*3/uL (ref 1.7–7.7)
Neutrophils Relative %: 78 %
Platelets: 218 10*3/uL (ref 150–400)
RBC: 4.18 MIL/uL (ref 3.87–5.11)
RDW: 17.2 % — AB (ref 11.5–15.5)
WBC: 6.3 10*3/uL (ref 4.0–10.5)

## 2015-09-25 LAB — COMPREHENSIVE METABOLIC PANEL
ALT: 12 U/L — ABNORMAL LOW (ref 14–54)
ANION GAP: 13 (ref 5–15)
AST: 16 U/L (ref 15–41)
Albumin: 2.5 g/dL — ABNORMAL LOW (ref 3.5–5.0)
Alkaline Phosphatase: 63 U/L (ref 38–126)
BILIRUBIN TOTAL: 0.5 mg/dL (ref 0.3–1.2)
BUN: 84 mg/dL — AB (ref 6–20)
CALCIUM: 8.2 mg/dL — AB (ref 8.9–10.3)
CO2: 24 mmol/L (ref 22–32)
Chloride: 102 mmol/L (ref 101–111)
Creatinine, Ser: 14.36 mg/dL — ABNORMAL HIGH (ref 0.44–1.00)
GFR calc Af Amer: 3 mL/min — ABNORMAL LOW (ref >60)
GFR, EST NON AFRICAN AMERICAN: 3 mL/min — AB (ref >60)
Glucose, Bld: 63 mg/dL — ABNORMAL LOW (ref 65–99)
POTASSIUM: 6.2 mmol/L — AB (ref 3.5–5.1)
Sodium: 139 mmol/L (ref 135–145)
TOTAL PROTEIN: 7.9 g/dL (ref 6.5–8.1)

## 2015-09-25 LAB — URINE MICROSCOPIC-ADD ON

## 2015-09-25 LAB — URINALYSIS, ROUTINE W REFLEX MICROSCOPIC
Bilirubin Urine: NEGATIVE
GLUCOSE, UA: NEGATIVE mg/dL
Ketones, ur: NEGATIVE mg/dL
Nitrite: NEGATIVE
PH: 8 (ref 5.0–8.0)
Protein, ur: >300 mg/dL — AB
Specific Gravity, Urine: 1.018 (ref 1.005–1.030)
Urobilinogen, UA: 0.2 mg/dL (ref 0.0–1.0)

## 2015-09-25 LAB — WET PREP, GENITAL: YEAST WET PREP: NONE SEEN

## 2015-09-25 LAB — I-STAT BETA HCG BLOOD, ED (MC, WL, AP ONLY)

## 2015-09-25 MED ORDER — CALCIUM ACETATE (PHOS BINDER) 667 MG PO CAPS
2001.0000 mg | ORAL_CAPSULE | Freq: Three times a day (TID) | ORAL | Status: DC
  Administered 2015-09-26 (×3): 2001 mg via ORAL
  Filled 2015-09-25 (×3): qty 3

## 2015-09-25 MED ORDER — OXYCODONE-ACETAMINOPHEN 5-325 MG PO TABS
1.0000 | ORAL_TABLET | Freq: Once | ORAL | Status: AC
  Administered 2015-09-25: 1 via ORAL
  Filled 2015-09-25: qty 1

## 2015-09-25 MED ORDER — RENA-VITE PO TABS
1.0000 | ORAL_TABLET | Freq: Every day | ORAL | Status: DC
  Administered 2015-09-25 – 2015-09-26 (×2): 1 via ORAL
  Filled 2015-09-25 (×2): qty 1

## 2015-09-25 MED ORDER — CALCIUM ACETATE (PHOS BINDER) 667 MG PO CAPS: 1334.0000 mg | ORAL_CAPSULE | ORAL | Status: DC | PRN

## 2015-09-25 MED ORDER — MORPHINE SULFATE (PF) 4 MG/ML IV SOLN
6.0000 mg | Freq: Once | INTRAVENOUS | Status: AC
  Administered 2015-09-25: 6 mg via INTRAVENOUS
  Filled 2015-09-25: qty 2

## 2015-09-25 MED ORDER — DEXTROSE 5 % IV SOLN
1.0000 g | Freq: Once | INTRAVENOUS | Status: AC
  Administered 2015-09-25: 1 g via INTRAVENOUS
  Filled 2015-09-25: qty 10

## 2015-09-25 MED ORDER — DEXTROSE 5 % IV SOLN
1.0000 g | INTRAVENOUS | Status: DC
  Filled 2015-09-25: qty 10

## 2015-09-25 MED ORDER — SODIUM POLYSTYRENE SULFONATE 15 GM/60ML PO SUSP
30.0000 g | Freq: Once | ORAL | Status: AC
  Administered 2015-09-25: 30 g via ORAL
  Filled 2015-09-25: qty 120

## 2015-09-25 MED ORDER — INFLUENZA VAC SPLIT QUAD 0.5 ML IM SUSY
0.5000 mL | PREFILLED_SYRINGE | INTRAMUSCULAR | Status: DC
  Filled 2015-09-25: qty 0.5

## 2015-09-25 MED ORDER — HYDROXYCHLOROQUINE SULFATE 200 MG PO TABS
400.0000 mg | ORAL_TABLET | Freq: Every day | ORAL | Status: DC
  Administered 2015-09-26: 400 mg via ORAL
  Filled 2015-09-25 (×2): qty 2

## 2015-09-25 MED ORDER — HEPARIN SODIUM (PORCINE) 5000 UNIT/ML IJ SOLN
5000.0000 [IU] | Freq: Three times a day (TID) | INTRAMUSCULAR | Status: DC
  Administered 2015-09-25 – 2015-09-26 (×2): 5000 [IU] via SUBCUTANEOUS
  Filled 2015-09-25 (×3): qty 1

## 2015-09-25 MED ORDER — CINACALCET HCL 30 MG PO TABS
30.0000 mg | ORAL_TABLET | ORAL | Status: DC
  Administered 2015-09-26: 30 mg via ORAL
  Filled 2015-09-25: qty 1

## 2015-09-25 MED ORDER — ACETAMINOPHEN 650 MG RE SUPP: 650.0000 mg | Freq: Four times a day (QID) | RECTAL | Status: DC | PRN

## 2015-09-25 MED ORDER — METRONIDAZOLE 500 MG PO TABS
2000.0000 mg | ORAL_TABLET | Freq: Once | ORAL | Status: AC
  Administered 2015-09-25: 2000 mg via ORAL
  Filled 2015-09-25: qty 4

## 2015-09-25 MED ORDER — ACETAMINOPHEN 325 MG PO TABS
650.0000 mg | ORAL_TABLET | Freq: Four times a day (QID) | ORAL | Status: DC | PRN
  Administered 2015-09-26: 650 mg via ORAL
  Filled 2015-09-25: qty 2

## 2015-09-25 MED ORDER — PHENAZOPYRIDINE HCL 100 MG PO TABS
95.0000 mg | ORAL_TABLET | Freq: Once | ORAL | Status: AC
  Administered 2015-09-25: 100 mg via ORAL
  Filled 2015-09-25: qty 1

## 2015-09-25 NOTE — ED Notes (Signed)
MD at bedside. 

## 2015-09-25 NOTE — ED Notes (Signed)
This RN made Dr. Wilson Singer aware of BP and SOB.  Dr. Wilson Singer at bedside.

## 2015-09-25 NOTE — ED Provider Notes (Signed)
CSN: [id number]     Arrival date & time 09/25/15  1021 History   First MD Initiated Contact with Patient 09/25/15 1208     Chief Complaint  Patient presents with  . Urinary Incontinence    The history is provided by the patient. No language interpreter was used.   [name] presents for evaluation of lower abdominal pain and dysuria. Symptoms started yesterday. She reports she's had vaginal bleeding for the last 60 days described as spotting. She's passed a few clots over the last 3 days. She reports lower abdominal pain with urination and frequent urination as well as some urinary incontinence. She does have some chills and occasional vomiting. She denies any fevers, chest pain. She receives hemodialysis on Tuesday Thursday Saturday. Her last session was on Saturday. She missed yesterday session due to abdominal discomfort.  Past Medical History  Diagnosis Date  . Lupus (systemic lupus erythematosus)     Previously followed with Dr. Charlestine Night, has not followed up recently  . Lupus nephritis 2006    Renal biopsy shows segmental endocapillary proliferation and cellular crescent formation (Class IIIA) and lupus membranous glomerulopathy (Class V, stage II)  . S/P pericardiocentesis 01/17/2013    H/o pericardial effusion with tamponade 2006   . H/O pleural effusion 01/17/2013  . H/O pericarditis 01/17/2013  . Polysubstance abuse     cocaine, MJ, tobacco  . 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 Bascilic vein transposition Left 02/05/2014    Procedure: [place];  Surgeon: [name], MD;  Location: [place];  Service: Vascular;  Laterality: Left;  Annell Greening Right 01/31/2014    Procedure: DIALYSIS CATHETER;  Surgeon: Serafina Mitchell, MD;  Location: [facility] CATH LAB;  Service: Cardiovascular;  Laterality: Right;   History reviewed. No pertinent family history. Social History  Substance Use Topics  .  Smoking status: Current Some Day Smoker -- 0.25 packs/day for 15 years  . Smokeless tobacco: Never Used     Comment: quit smoking s/p hospital discharge/ SMOKES ABOUT 2-3 CIGARETTES A DAY  . Alcohol Use: Yes     Comment: occ   OB History    No data available     Review of Systems  All other systems reviewed and are negative.     Allergies  Tobramycin sulfate  Home Medications   Prior to Admission medications   Medication Sig Start Date End Date Taking? Authorizing Provider  amLODipine (NORVASC) 5 MG tablet Take 5mg  daily on non dialysis days Patient not taking: Reported on 09/05/2015 06/24/15   Lucious Groves, DO  calcium acetate (PHOSLO) 667 MG capsule Take 1,334-2,001 mg by mouth 3 (three) times daily with meals. Takes 2 with snacks and 3 with meals    Historical Provider, MD  cinacalcet (SENSIPAR) 30 MG tablet Take 30 mg by mouth daily.    Historical Provider, MD  darbepoetin (ARANESP) 60 MCG/0.3ML SOLN injection Inject 0.3 mLs (60 mcg total) into the vein every Wednesday with hemodialysis. 02/08/14   Jessee Avers, MD  hydroxychloroquine (PLAQUENIL) 200 MG tablet Take 2 tablets (400 mg total) by mouth daily. Patient not taking: Reported on 09/05/2015 06/24/15   Lucious Groves, DO  ibuprofen (ADVIL,MOTRIN) 800 MG tablet Take 1 tablet (800 mg total) by mouth 3 (three) times daily. Patient not taking: Reported on 09/05/2015 06/14/15   Noemi Chapel, MD  multivitamin (RENA-VIT) TABS tablet Take 1 tablet by mouth daily.  Historical Provider, MD  Naproxen Sodium (ALEVE) 220 MG CAPS Take 2 capsules by mouth 2 (two) times daily as needed (pain).    Historical Provider, MD  Nutritional Supplements (FEEDING SUPPLEMENT, NEPRO CARB STEADY,) LIQD Take 237 mLs by mouth daily.    Historical Provider, MD  triamcinolone cream (KENALOG) 0.1 % Apply 1 application topically 2 (two) times daily. Patient not taking: Reported on 09/05/2015 06/24/15   Lucious Groves, DO   BP 148/100 mmHg  Pulse 89   Temp(Src) 98.3 F (36.8 C) (Oral)  Resp 18  SpO2 98%  LMP 08/29/2015 (Exact Date) Physical Exam  Constitutional: She is oriented to person, place, and time. She appears well-developed and well-nourished.  HENT:  Head: Normocephalic and atraumatic.  Cardiovascular: Normal rate and regular rhythm.   No murmur heard. Pulmonary/Chest: Effort normal and breath sounds normal. No respiratory distress.  Abdominal: Soft. There is no rebound and no guarding.  Mild suprapubic tenderness  Genitourinary:  Thin and watery dark blood in vaginal vault. No CMT. No clots.  Musculoskeletal: She exhibits no edema or tenderness.  Fistula in the left upper extremity with palpable thrill.  Neurological: She is alert and oriented to person, place, and time.  Skin: Skin is warm and dry.  Psychiatric: She has a normal mood and affect. Her behavior is normal.  Nursing note and vitals reviewed.   ED Course  Procedures (including critical care time) Labs Review Labs Reviewed  WET PREP, GENITAL - Abnormal; Notable for the following:    Trich, Wet Prep FEW (*)    Clue Cells Wet Prep HPF POC FEW (*)    WBC, Wet Prep HPF POC MANY (*)    All other components within normal limits  URINALYSIS, ROUTINE W REFLEX MICROSCOPIC (NOT AT [facility]) - Abnormal; Notable for the following:    APPearance CLOUDY (*)    Hgb urine dipstick LARGE (*)    Protein, ur >300 (*)    Leukocytes, UA MODERATE (*)    All other components within normal limits  CBC WITH DIFFERENTIAL/PLATELET - Abnormal; Notable for the following:    Hemoglobin 11.5 (*)    RDW 17.2 (*)    All other components within normal limits  COMPREHENSIVE METABOLIC PANEL - Abnormal; Notable for the following:    Potassium 6.2 (*)    Glucose, Bld 63 (*)    BUN 84 (*)    Creatinine, Ser 14.36 (*)    Calcium 8.2 (*)    Albumin 2.5 (*)    ALT 12 (*)    GFR calc non Af Amer 3 (*)    GFR calc Af Amer 3 (*)    All other components within normal limits  URINE  MICROSCOPIC-ADD ON - Abnormal; Notable for the following:    Bacteria, UA FEW (*)    All other components within normal limits  URINE CULTURE  HIV ANTIBODY (ROUTINE TESTING)  RPR  I-STAT BETA HCG BLOOD, ED ([facility], [facility], [facility] ONLY)  GC/CHLAMYDIA PROBE AMP ([place]) NOT AT [facility]    Imaging Review No results found. I have personally reviewed and evaluated these images and lab results as part of my medical decision-making.   EKG Interpretation   Date/Time:  Wednesday September 25 2015 14:37:52 EDT Ventricular Rate:  91 PR Interval:  141 QRS Duration: 84 QT Interval:  411 QTC Calculation: 506 R Axis:   83 Text Interpretation:  Sinus rhythm Borderline T wave abnormalities  Prolonged QT interval Confirmed by Hazle Coca [phone]) on 09/25/2015 2:57:37  PM      MDM   Final diagnoses:  Acute UTI  Hyperkalemia   Pt with ESRD on HD here with dysuria/urinary incontinence.  UA is concerning for UTI - treating with rocephin.  Pt with small amt of vaginal bleeding on exam, no current evidence of PID.  Treating UTI with Rocephin.  Pt is hyperkalemic, missed dialysis, no volume overload on exam.  D/w Dr. Justin Mend with Nephrology - recommends 30gm kayexalate once with repeat potassium - if this drops below 6 she can follow up for outpatient dialysis tomorrow.  Discussed with patient findings of studies and plan for kayexalate and repeat potassium.  Patient care transferred to Dr. Wilson Singer pending repeat potassium, re-eval.   [name] Reichert, MD 09/25/15 [phone]

## 2015-09-25 NOTE — ED Notes (Signed)
Attempted to call report

## 2015-09-25 NOTE — ED Notes (Signed)
Dr. Wilson Singer aware of patient previously receiving 30mg  kayexalate, advised to administer another 30mg  at this time. Dr. Wilson Singer also aware of patients elevated heart rate.

## 2015-09-25 NOTE — ED Notes (Signed)
Pt is dialysis pt, last tx was Saturday and pt missed Tuesday treatment. Pt having urinary frequency and today was incontinent. Reports passing blood clots, unsure if its vaginal or in urine. No acute distress noted at triage.

## 2015-09-25 NOTE — H&P (Signed)
Date: 09/25/2015               Patient Name:  [name] MRN: [id number]  DOB: 05-10-1981 Age / Sex: 34 y.o., female   PCP: Gust Rung, DO         Medical Service: Internal Medicine Teaching Service         Attending Physician: Dr. Tyson Alias, MD    First Contact: Dr. Loney Loh Pager: [phone]  Second Contact: Dr. Johna Roles Pager: [phone]       After Hours (After 5p/  First Contact Pager: [phone]  weekends / holidays): Second Contact Pager: [phone]            Chief Complaint: Dysuria  History of Present Illness:   [name] is a 34 year old women with a PMH of SLE with nephritis on HD and polysubstance abuse who presents with abdominal pain and pain with urination since yesterday. She described the abdominal pain as a "pressure" in her lower abdomen. Her pain with urination, is associated with increased urinary frequency, although she typically does not make much urine to to ESRD. She has had some chills. She reports one episode of non-bloody, non-bilious emesis. She has felt dizzy in the last hour, although she thinks it's from the morphine she received in the ED. She denies any loss of consciousness, headaches, pain behind her eyes, shortness of breath, chest pain, pain or swelling in her legs, or new weakness.  She also reports vaginal bleeding and clotting over the course of 2 months. As mentioned, she denies any chronic dizziness or shortness of breath. She has had two uncomplicated births. She also reports bleeding gums that are also longstanding.   She did not arrive for her dialysis session last Tuesday, and she reports that she consistently misses this session on her TTS schedule. She said she misses this due to "child care" issues. She also reports consistent non-adherence with her Norvasc.   In the ED, she was noted to be hyperkalemic to 6.2, which came down only to 6.1 after administration of kayexalate. However, the chronicity of  the BMET lab draws and kayexalate administration is unclear after talking with the patient. She reports three bowel movements since kayexalate administration. She was also hypertensive to 152/120. UA was consistent with UTI, and she was started on IV ceftriaxone. Genitourinary exam in ED revealed thin and watery dark blood in vaginal vault without clot.   Meds: Current Facility-Administered Medications  Medication Dose Route Frequency [name] Last Rate Last Dose  . acetaminophen (TYLENOL) tablet 650 mg  650 mg Oral Q6H PRN [name] Brick, MD       Or  . acetaminophen (TYLENOL) suppository 650 mg  650 mg Rectal Q6H PRN [name] Brick, MD      . Melene Muller ON 09/26/2015] calcium acetate (PHOSLO) capsule 1,334-2,001 mg  1,334-2,001 mg Oral TID WC [name] Brick, MD      . Melene Muller ON 09/26/2015] cefTRIAXone (ROCEPHIN) 1 g in dextrose 5 % 50 mL IVPB  1 g Intravenous Q24H [name], RPH      . cinacalcet (SENSIPAR) tablet 30 mg  30 mg Oral QODAY [name] Brick, MD      . heparin injection 5,000 Units  5,000 Units Subcutaneous 3 times per day [name] Brick, MD      . hydroxychloroquine (PLAQUENIL) tablet 400 mg  400 mg Oral Daily [name] Brick, MD      . multivitamin (RENA-VIT) tablet  1 tablet  1 tablet Oral Daily [name] Brick, MD       Current Outpatient Prescriptions  Medication Sig Dispense Refill  . amLODipine (NORVASC) 5 MG tablet Take 5mg  daily on non dialysis days 45 tablet 1  . calcium acetate (PHOSLO) 667 MG capsule Take 1,334-2,001 mg by mouth 3 (three) times daily with meals. Takes 2 with snacks and 3 with meals    . cinacalcet (SENSIPAR) 30 MG tablet Take 30 mg by mouth every other day.     . darbepoetin (ARANESP) 60 MCG/0.3ML SOLN injection Inject 0.3 mLs (60 mcg total) into the vein every Wednesday with hemodialysis. (Patient taking differently: Inject 60 mcg into the vein every 7 (seven) days. ) 4.2 mL 0  . hydroxychloroquine (PLAQUENIL) 200 MG tablet Take 2 tablets (400 mg total)  by mouth daily. 60 tablet 2  . multivitamin (RENA-VIT) TABS tablet Take 1 tablet by mouth daily.    . Naproxen Sodium (ALEVE) 220 MG CAPS Take 2 capsules by mouth 2 (two) times daily as needed (pain).    . Nutritional Supplements (FEEDING SUPPLEMENT, NEPRO CARB STEADY,) LIQD Take 237 mLs by mouth daily.    Marland Kitchen triamcinolone cream (KENALOG) 0.1 % Apply 1 application topically 2 (two) times daily. (Patient taking differently: Apply 1 application topically 2 (two) times daily. As needed) 30 g 0  . ibuprofen (ADVIL,MOTRIN) 800 MG tablet Take 1 tablet (800 mg total) by mouth 3 (three) times daily. (Patient not taking: Reported on 09/05/2015) 21 tablet 0    Allergies: Allergies as of 09/25/2015 - Review Complete 09/25/2015  Allergen Reaction Noted  . Tobramycin sulfate Swelling 07/05/2012   Past Medical History  Diagnosis Date  . Lupus (systemic lupus erythematosus)     Previously followed with Dr. Kellie Simmering, has not followed up recently  . Lupus nephritis 2006    Renal biopsy shows segmental endocapillary proliferation and cellular crescent formation (Class IIIA) and lupus membranous glomerulopathy (Class V, stage II)  . S/P pericardiocentesis 01/17/2013    H/o pericardial effusion with tamponade 2006   . H/O pleural effusion 01/17/2013  . H/O pericarditis 01/17/2013  . Polysubstance abuse     cocaine, MJ, tobacco  . 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 Bascilic vein transposition Left 02/05/2014    Procedure: BASCILIC VEIN TRANSPOSITION;  Surgeon: Larina Earthly, MD;  Location: [facility] OR;  Service: Vascular;  Laterality: Left;  Susie Cassette Right 01/31/2014    Procedure: DIALYSIS CATHETER;  Surgeon: Nada Libman, MD;  Location: [facility] CATH LAB;  Service: Cardiovascular;  Laterality: Right;   History reviewed. No pertinent family history. Social History   Social History  . Marital Status: Single    Spouse Name: N/A  .  Number of Children: N/A  . Years of Education: N/A   Occupational History  . Not on file.   Social History Main Topics  . Smoking status: Current Some Day Smoker -- 0.25 packs/day for 15 years  . Smokeless tobacco: Never Used     Comment: quit smoking s/p hospital discharge/ SMOKES ABOUT 2-3 CIGARETTES A DAY  . Alcohol Use: Yes     Comment: occ  . Drug Use: 5.00 per week    Special: Marijuana  . Sexual Activity: Yes   Other Topics Concern  . Not on file   Social History Narrative   Review of Systems: Negative except per HPI.  Physical Exam: Blood pressure 152/120, pulse  97, temperature 98.3 F (36.8 C), temperature source Oral, resp. rate 32, last menstrual period 08/29/2015, SpO2 97 %. General: Lying in bed, No acute distress. HEENT: Head atraumatic. Bleeding gingiva with dark macula on tongue. PERRL. EOMI. Cardiovascular: Tachycardic. Regular rhythm. No murmurs, rubs, or gallops. Palpable thrill at left upper extremity AVF Pulmonary: Clear to ausculation bilaterally Abdominal: Soft. No rebound or guarding. Suprapubic tenderness Neurological: Normal strength. AAOx4 Extremities: No calf swelling or tenderness. No clubbing or cyanosis Psychiatric: Normal mood and affect   Lab results: Basic Metabolic Panel:  Recent Labs  91/47/82 1319 09/25/15 2005  NA 139 135  K 6.2* 6.1*  CL 102 98*  CO2 24 20*  GLUCOSE 63* 81  BUN 84* 88*  CREATININE 14.36* 14.57*  CALCIUM 8.2* 8.0*   Liver Function Tests:  Recent Labs  09/25/15 1319  AST 16  ALT 12*  ALKPHOS 63  BILITOT 0.5  PROT 7.9  ALBUMIN 2.5*   CBC:  Recent Labs  09/25/15 1319  WBC 6.3  NEUTROABS 5.0  HGB 11.5*  HCT 37.6  MCV 90.0  PLT 218   Urinalysis:  Recent Labs  09/25/15 1412  COLORURINE YELLOW  LABSPEC 1.018  PHURINE 8.0  GLUCOSEU NEGATIVE  HGBUR LARGE*  BILIRUBINUR NEGATIVE  KETONESUR NEGATIVE  PROTEINUR >300*  UROBILINOGEN 0.2  NITRITE NEGATIVE  LEUKOCYTESUR MODERATE*   Wet  Prep Showed few Trich, few Clue Cells, many WBCs  Imaging results:  US Renal  09/25/2015   CLINICAL DATA:  Abdomen pain, hematuria.  End-stage renal disease.  EXAM: RENAL / URINARY TRACT ULTRASOUND COMPLETE  COMPARISON:  February 01, 2014  FINDINGS: Right Kidney:  Length: 9.53 cm. There is diffuse increased echotexture. No mass or hydronephrosis visualized.  Left Kidney:  Length: 8.29 cm. There is diffuse increased echotexture. No mass or hydronephrosis visualized.  Bladder:  The bladder is decompressed limiting evaluation.  There is pleural effusion.  IMPRESSION: No acute abnormality identified.  Diffuse increased echotexture both kidneys consistent with end-stage renal disease.   Electronically Signed   By: Sherian Rein M.D.   On: 09/25/2015 19:23    EKG Ventricular Rate: 91 PR Interval: 141 QRS Duration: 84 QT Interval: 411 QTC Calculation: 506 R Axis: 83 Text Interpretation: Sinus rhythm Borderline T wave abnormalities  Prolonged QT interval   Assessment & Plan by Problem:  Hyperkalemia: While it appears that potassium has only decreased to 6.1 after kayexalate, patient reports that labs were drawn at approximately the same time the kayexalate was give. Hyperkalemia is likely related to HD non-adherence.  - RFP in the morning - Consult nephro for HD tomorrow - Admit to telemetry  Uncomplicated UTI: UA and symptoms consistent with UTI - IV ceftriaxone 1g q24hrs - Urine culture pending  Menorrhagia: Hgb only mildly low at 11.5. Denies any symptoms of anemia. Could be related to platelet dysfunction with uremia - Referral to outpatient OB/Gyn.  Gingival Bleeding:  Could be manifestation of SLE or platelet dysfunction due to uremia.  Trichomoniasis and Bacterial Vaginosis: Received one dose of 2g metronidazole. - Referral to OB/Gyn as outpatient  Essential HTN: Elevated to 150/120. The patient has not been taking her prescribed Norvasc. - Counsel patient on adherence -  HOLD amlodipine until after HD tomorrow  ESRD secondary to Lupus Nephritis: Social work is involved to assist her with making it to her Tuesday dialysis sessions.  - Consult nephro for HD tomorrow  SLE: Continue hydroxychloroquine 400 mg daily.  Hypocalcemia: Longstanding issue. 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 Anticipated discharge in approximately one day.   The patient does have a current PCP Gust Rung, DO) and does need an [facility] hospital follow-up appointment after discharge.  The patient does have transportation limitations that hinder transportation to clinic appointments.  Signed: Ruben Im, MD 09/25/2015, 9:01 PM

## 2015-09-25 NOTE — ED Notes (Signed)
Pt reports to this RN that pt has missed multiple Tuesday sessions d/t child care issues.  Pt reports social worker at dialysis center aware but no solution.  This RN consulted with Mariann Laster, case Freight forwarder.

## 2015-09-25 NOTE — ED Notes (Signed)
Critical Lab Value:  Potassium 6.1  Dr. Wilson Singer made aware.

## 2015-09-26 DIAGNOSIS — N920 Excessive and frequent menstruation with regular cycle: Secondary | ICD-10-CM

## 2015-09-26 DIAGNOSIS — M3214 Glomerular disease in systemic lupus erythematosus: Secondary | ICD-10-CM

## 2015-09-26 DIAGNOSIS — F17210 Nicotine dependence, cigarettes, uncomplicated: Secondary | ICD-10-CM

## 2015-09-26 DIAGNOSIS — R1030 Lower abdominal pain, unspecified: Secondary | ICD-10-CM

## 2015-09-26 DIAGNOSIS — N186 End stage renal disease: Secondary | ICD-10-CM

## 2015-09-26 DIAGNOSIS — Z87440 Personal history of urinary (tract) infections: Secondary | ICD-10-CM | POA: Diagnosis not present

## 2015-09-26 DIAGNOSIS — R3915 Urgency of urination: Secondary | ICD-10-CM

## 2015-09-26 DIAGNOSIS — Z9119 Patient's noncompliance with other medical treatment and regimen: Secondary | ICD-10-CM

## 2015-09-26 DIAGNOSIS — Z992 Dependence on renal dialysis: Secondary | ICD-10-CM

## 2015-09-26 DIAGNOSIS — B9689 Other specified bacterial agents as the cause of diseases classified elsewhere: Secondary | ICD-10-CM

## 2015-09-26 DIAGNOSIS — N760 Acute vaginitis: Secondary | ICD-10-CM

## 2015-09-26 DIAGNOSIS — E875 Hyperkalemia: Secondary | ICD-10-CM

## 2015-09-26 DIAGNOSIS — I120 Hypertensive chronic kidney disease with stage 5 chronic kidney disease or end stage renal disease: Secondary | ICD-10-CM

## 2015-09-26 DIAGNOSIS — A5901 Trichomonal vulvovaginitis: Secondary | ICD-10-CM

## 2015-09-26 DIAGNOSIS — K068 Other specified disorders of gingiva and edentulous alveolar ridge: Secondary | ICD-10-CM

## 2015-09-26 LAB — RENAL FUNCTION PANEL
ALBUMIN: 2.3 g/dL — AB (ref 3.5–5.0)
Anion gap: 15 (ref 5–15)
BUN: 87 mg/dL — ABNORMAL HIGH (ref 6–20)
CALCIUM: 7.8 mg/dL — AB (ref 8.9–10.3)
CO2: 20 mmol/L — AB (ref 22–32)
CREATININE: 14.73 mg/dL — AB (ref 0.44–1.00)
Chloride: 102 mmol/L (ref 101–111)
GFR calc Af Amer: 3 mL/min — ABNORMAL LOW (ref >60)
GFR calc non Af Amer: 3 mL/min — ABNORMAL LOW (ref >60)
GLUCOSE: 85 mg/dL (ref 65–99)
PHOSPHORUS: 9 mg/dL — AB (ref 2.5–4.6)
Potassium: 5.9 mmol/L — ABNORMAL HIGH (ref 3.5–5.1)
SODIUM: 137 mmol/L (ref 135–145)

## 2015-09-26 LAB — URINE CULTURE: CULTURE: NO GROWTH

## 2015-09-26 LAB — CBC
HCT: 31.3 % — ABNORMAL LOW (ref 36.0–46.0)
Hemoglobin: 9.7 g/dL — ABNORMAL LOW (ref 12.0–15.0)
MCH: 27.2 pg (ref 26.0–34.0)
MCHC: 31 g/dL (ref 30.0–36.0)
MCV: 87.9 fL (ref 78.0–100.0)
Platelets: 185 K/uL (ref 150–400)
RBC: 3.56 MIL/uL — ABNORMAL LOW (ref 3.87–5.11)
RDW: 17 % — ABNORMAL HIGH (ref 11.5–15.5)
WBC: 3.6 K/uL — ABNORMAL LOW (ref 4.0–10.5)

## 2015-09-26 LAB — HIV ANTIBODY (ROUTINE TESTING W REFLEX): HIV SCREEN 4TH GENERATION: NONREACTIVE

## 2015-09-26 LAB — GC/CHLAMYDIA PROBE AMP (~~LOC~~) NOT AT ARMC
Chlamydia: NEGATIVE
NEISSERIA GONORRHEA: POSITIVE — AB

## 2015-09-26 LAB — APTT: aPTT: 36 seconds (ref 24–37)

## 2015-09-26 LAB — RPR: RPR: NONREACTIVE

## 2015-09-26 LAB — PROTIME-INR
INR: 1.25 (ref 0.00–1.49)
Prothrombin Time: 15.8 seconds — ABNORMAL HIGH (ref 11.6–15.2)

## 2015-09-26 MED ORDER — SULFAMETHOXAZOLE-TRIMETHOPRIM 800-160 MG PO TABS: 1.0000 | ORAL_TABLET | Freq: Two times a day (BID) | ORAL | Status: DC

## 2015-09-26 MED ORDER — DEXTROSE 5 % IV SOLN
1.0000 g | INTRAVENOUS | Status: DC
  Administered 2015-09-26: 1 g via INTRAVENOUS
  Filled 2015-09-26: qty 10

## 2015-09-26 MED ORDER — HEPARIN SODIUM (PORCINE) 1000 UNIT/ML DIALYSIS: 2600.0000 [IU] | Freq: Once | INTRAMUSCULAR | Status: DC

## 2015-09-26 MED ORDER — ACETAMINOPHEN 325 MG PO TABS
650.0000 mg | ORAL_TABLET | Freq: Once | ORAL | Status: AC
  Administered 2015-09-26: 650 mg via ORAL

## 2015-09-26 MED ORDER — SODIUM CHLORIDE 0.9 % IV SOLN: 100.0000 mL | INTRAVENOUS | Status: DC | PRN

## 2015-09-26 MED ORDER — SODIUM CHLORIDE 0.9 % IV SOLN
62.5000 mg | Freq: Once | INTRAVENOUS | Status: AC
  Administered 2015-09-26: 62.5 mg via INTRAVENOUS
  Filled 2015-09-26: qty 5

## 2015-09-26 MED ORDER — DARBEPOETIN ALFA 200 MCG/0.4ML IJ SOSY
200.0000 ug | PREFILLED_SYRINGE | INTRAMUSCULAR | Status: DC
  Filled 2015-09-26: qty 0.4

## 2015-09-26 MED ORDER — PENTAFLUOROPROP-TETRAFLUOROETH EX AERO: 1.0000 "application " | INHALATION_SPRAY | CUTANEOUS | Status: DC | PRN

## 2015-09-26 MED ORDER — ACETAMINOPHEN 325 MG PO TABS
ORAL_TABLET | ORAL | Status: AC
  Filled 2015-09-26: qty 2

## 2015-09-26 MED ORDER — HEPARIN SODIUM (PORCINE) 1000 UNIT/ML DIALYSIS: 1000.0000 [IU] | INTRAMUSCULAR | Status: DC | PRN

## 2015-09-26 MED ORDER — ONDANSETRON HCL 4 MG/2ML IJ SOLN
4.0000 mg | Freq: Once | INTRAMUSCULAR | Status: AC
  Administered 2015-09-26: 4 mg via INTRAVENOUS
  Filled 2015-09-26: qty 2

## 2015-09-26 MED ORDER — DARBEPOETIN ALFA 200 MCG/0.4ML IJ SOSY
PREFILLED_SYRINGE | INTRAMUSCULAR | Status: AC
  Administered 2015-09-26: 200 ug
  Filled 2015-09-26: qty 0.4

## 2015-09-26 MED ORDER — ALTEPLASE 2 MG IJ SOLR
2.0000 mg | Freq: Once | INTRAMUSCULAR | Status: DC | PRN
  Filled 2015-09-26: qty 2

## 2015-09-26 MED ORDER — AZITHROMYCIN 500 MG PO TABS
1000.0000 mg | ORAL_TABLET | Freq: Once | ORAL | Status: AC
  Administered 2015-09-26: 1000 mg via ORAL
  Filled 2015-09-26: qty 2

## 2015-09-26 MED ORDER — LIDOCAINE HCL (PF) 1 % IJ SOLN: 5.0000 mL | INTRAMUSCULAR | Status: DC | PRN

## 2015-09-26 MED ORDER — LIDOCAINE-PRILOCAINE 2.5-2.5 % EX CREA
1.0000 "application " | TOPICAL_CREAM | CUTANEOUS | Status: DC | PRN
  Filled 2015-09-26: qty 5

## 2015-09-26 NOTE — Progress Notes (Signed)
PT Cancellation Note  Patient Details Name: [name] MRN: [id number] DOB: Feb 14, 1981   Cancelled Treatment:    Reason Eval/Treat Not Completed: Patient at procedure or test/unavailable (pt in HD)   [facility] 09/26/2015, 3:08 PM [facility] PT [phone]

## 2015-09-26 NOTE — Procedures (Signed)
I was present at this session.  I have reviewed the session itself and made appropriate changes. BP^ to start but coming down .  Access press ok at 400 BFR  DETERDING,JAMES L 9/29/20162:23 PM

## 2015-09-26 NOTE — Progress Notes (Signed)
Order for discharge written with criteria that pt is stable.  Pt stable upon return from HD, but became nauseated and vomited a moderate amount of emesis @1850 ,  to include pills that was recently given. No order for prn Zofran. Oncoming nurse to obtain order for Zofran, and to monitor pt for potential discharge.

## 2015-09-26 NOTE — Progress Notes (Signed)
Page to Internal Medicine for notification of need for zofran with call back. New Order for Zofran. Will admin per order and monitor. Dorthey Sawyer, RN

## 2015-09-26 NOTE — Progress Notes (Signed)
Patient was given discharge orders.  We went over them and Patient did not have any further questions.  Malcolm Metro, RN

## 2015-09-26 NOTE — Consult Note (Signed)
[facility] Renal Consultation Note    Indication for Consultation:  Management of ESRD/hemodialysis; anemia, hypertension/volume and secondary hyperparathyroidism PCP:  HPI: [name] is a 34 y.o. female with ESRD who has hemodialysis TTS at [facility]. PMH significant for lupus erythematosus, lupus nephritis, pericardial effusion on 2006 with tamponade, HTN, polysubstance abuse.  Patient presented to the ED after 2-3 day history of urinary frequency and vaginal bleeding with clots. Patient was found to have K+ 6.2. Was treated with Rocephin for UTI, given Kayexalate 30grams PO. Original plan was for patient to receive Kayexalate, recheck K+ and if K+ was less than 6 for patient to be [facility] and attend HD at her HD center. However when K+ was rechecked, K+ was 6.1.  Patient was given additional dose of Kayexalate and admitted for hyperkalemia/UTI. Renal ultrasound done while in ED revealed no acute findings, Diffuse increased echotexture both kidneys consistent with end-stage renal disease.  Currently patient denies fever, chills, nausea, vomiting, malaise. C/O polyuria, dysuria and vaginal bleeding. Had pelvic exam in ED, referred of OB/GYN. States symptoms started 2-3 days ago. Missed hemodialysis 09/24/15.   Patient has HD at [facility]. History of missed treatments and medical noncompliance. Last incenter values as follows: Hgb 9.8 (09/19/15) Ca 7.6 C Ca 8.52 Phos 9.6 (09/19/15) PTH 1026 (08/27/15) Being prescribed Ca Acetate binders and Sensipar.   Past Medical History  Diagnosis Date  . Lupus (systemic lupus erythematosus)     Previously followed with Dr. Charlestine Night, has not followed up recently  . Lupus nephritis 2006    Renal biopsy shows segmental endocapillary proliferation and cellular crescent formation (Class IIIA) and lupus membranous glomerulopathy (Class V, stage II)  . S/P pericardiocentesis 01/17/2013    H/o pericardial  effusion with tamponade 2006   . H/O pleural effusion 01/17/2013  . H/O pericarditis 01/17/2013  . Polysubstance abuse     cocaine, MJ, tobacco  . 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 Bascilic vein transposition Left 02/05/2014    Procedure: [place];  Surgeon: [name], MD;  Location: [place];  Service: Vascular;  Laterality: Left;  Annell Greening Right 01/31/2014    Procedure: DIALYSIS CATHETER;  Surgeon: Serafina Mitchell, MD;  Location: [facility] CATH LAB;  Service: Cardiovascular;  Laterality: Right;   History reviewed. No pertinent family history. Social History:  reports that she has been smoking.  She has never used smokeless tobacco. She reports that she drinks alcohol. She reports that she uses illicit drugs (Marijuana) about 5 times per week. Allergies  Allergen Reactions  . Tobramycin Sulfate Swelling   Prior to Admission medications   Medication Sig Start Date End Date Taking? Authorizing Provider  amLODipine (NORVASC) 5 MG tablet Take 5mg  daily on non dialysis days 06/24/15  Yes Lucious Groves, DO  calcium acetate (PHOSLO) 667 MG capsule Take 1,334-2,001 mg by mouth 3 (three) times daily with meals. Takes 2 with snacks and 3 with meals   Yes Historical Provider, MD  cinacalcet (SENSIPAR) 30 MG tablet Take 30 mg by mouth every other day.    Yes Historical Provider, MD  darbepoetin (ARANESP) 60 MCG/0.3ML SOLN injection Inject 0.3 mLs (60 mcg total) into the vein every Wednesday with hemodialysis. Patient taking differently: Inject 60 mcg into the vein every 7 (seven) days.  02/08/14  Yes Jessee Avers, MD  hydroxychloroquine (PLAQUENIL) 200 MG tablet Take 2  tablets (400 mg total) by mouth daily. 06/24/15  Yes Lucious Groves, DO  multivitamin (RENA-VIT) TABS tablet Take 1 tablet by mouth daily.   Yes Historical Provider, MD  Naproxen Sodium (ALEVE) 220 MG CAPS Take 2 capsules by mouth 2 (two)  times daily as needed (pain).   Yes Historical Provider, MD  Nutritional Supplements (FEEDING SUPPLEMENT, NEPRO CARB STEADY,) LIQD Take 237 mLs by mouth daily.   Yes Historical Provider, MD  triamcinolone cream (KENALOG) 0.1 % Apply 1 application topically 2 (two) times daily. Patient taking differently: Apply 1 application topically 2 (two) times daily. As needed 06/24/15  Yes Lucious Groves, DO  ibuprofen (ADVIL,MOTRIN) 800 MG tablet Take 1 tablet (800 mg total) by mouth 3 (three) times daily. Patient not taking: Reported on 09/05/2015 06/14/15   Noemi Chapel, MD   Current Facility-Administered Medications  Medication Dose Route Frequency Provider Last Rate Last Dose  . acetaminophen (TYLENOL) tablet 650 mg  650 mg Oral Q6H PRN Norman Herrlich, MD   650 mg at 09/26/15 0747   Or  . acetaminophen (TYLENOL) suppository 650 mg  650 mg Rectal Q6H PRN Norman Herrlich, MD      . calcium acetate (PHOSLO) capsule 1,334 mg  1,334 mg Oral PRN Axel Filler, MD      . calcium acetate (PHOSLO) capsule 2,001 mg  2,001 mg Oral TID WC Norman Herrlich, MD   2,001 mg at 09/26/15 0916  . cefTRIAXone (ROCEPHIN) 1 g in dextrose 5 % 50 mL IVPB  1 g Intravenous Q24H Rachel L Rumbarger, RPH      . cinacalcet (SENSIPAR) tablet 30 mg  30 mg Oral Q48H Norman Herrlich, MD   30 mg at 09/26/15 0916  . Darbepoetin Alfa (ARANESP) injection 200 mcg  200 mcg Intravenous Q Thu-HD Valentina Gu, NP      . ferric gluconate (NULECIT) 62.5 mg in sodium chloride 0.9 % 100 mL IVPB  62.5 mg Intravenous Once Valentina Gu, NP      . heparin injection 5,000 Units  5,000 Units Subcutaneous 3 times per day Norman Herrlich, MD   5,000 Units at 09/26/15 0739  . hydroxychloroquine (PLAQUENIL) tablet 400 mg  400 mg Oral Daily Norman Herrlich, MD   400 mg at 09/25/15 2200  . Influenza vac split quadrivalent PF (FLUARIX) injection 0.5 mL  0.5 mL Intramuscular Tomorrow-1000 Axel Filler, MD      . multivitamin (RENA-VIT)  tablet 1 tablet  1 tablet Oral Daily Norman Herrlich, MD   1 tablet at 09/25/15 2325   Labs: Basic Metabolic Panel:  Recent Labs Lab 09/25/15 1319 09/25/15 2005 09/26/15 0458  NA 139 135 137  K 6.2* 6.1* 5.9*  CL 102 98* 102  CO2 24 20* 20*  GLUCOSE 63* 81 85  BUN 84* 88* 87*  CREATININE 14.36* 14.57* 14.73*  CALCIUM 8.2* 8.0* 7.8*  PHOS  --   --  9.0*   Liver Function Tests:  Recent Labs Lab 09/25/15 1319 09/26/15 0458  AST 16  --   ALT 12*  --   ALKPHOS 63  --   BILITOT 0.5  --   PROT 7.9  --   ALBUMIN 2.5* 2.3*   No results for input(s): LIPASE, AMYLASE in the last 168 hours. No results for input(s): AMMONIA in the last 168 hours. CBC:  Recent Labs Lab 09/25/15 1319  WBC 6.3  NEUTROABS 5.0  HGB 11.5*  HCT 37.6  MCV 90.0  PLT 218   Cardiac Enzymes: No results for input(s): CKTOTAL, CKMB, CKMBINDEX, TROPONINI in the last 168 hours. CBG: No results for input(s): GLUCAP in the last 168 hours. Iron Studies: No results for input(s): IRON, TIBC, TRANSFERRIN, FERRITIN in the last 72 hours. Studies/Results: US Renal  09/25/2015   CLINICAL DATA:  Abdomen pain, hematuria.  End-stage renal disease.  EXAM: RENAL / URINARY TRACT ULTRASOUND COMPLETE  COMPARISON:  February 01, 2014  FINDINGS: Right Kidney:  Length: 9.53 cm. There is diffuse increased echotexture. No mass or hydronephrosis visualized.  Left Kidney:  Length: 8.29 cm. There is diffuse increased echotexture. No mass or hydronephrosis visualized.  Bladder:  The bladder is decompressed limiting evaluation.  There is pleural effusion.  IMPRESSION: No acute abnormality identified.  Diffuse increased echotexture both kidneys consistent with end-stage renal disease.   Electronically Signed   By: Abelardo Diesel M.D.   On: 09/25/2015 19:23    ROS: As per HPI otherwise negative.   Physical Exam: Filed Vitals:   09/25/15 2035 09/25/15 2115 09/25/15 2300 09/26/15 0454  BP:  172/137 155/103 146/102  Pulse:   99 87   Temp:   97.4 F (36.3 C) 98.5 F (36.9 C)  TempSrc:   Oral Oral  Resp:  24 20 18   Height:   5\' 4"  (1.626 m)   Weight:   54.114 kg (119 lb 4.8 oz)   SpO2: 97%  100% 100%     General: Well developed, well nourished, in no acute distress. Head: Normocephalic, atraumatic, sclera non-icteric, mucus membranes are moist Neck: Supple. JVD not elevated. Lungs: Clear bilaterally to auscultation without wheezes, rales, or rhonchi. Breathing is unlabored. Heart: RRR with S1 S2. No murmurs, rubs, or gallops appreciated. Abdomen: Soft, non-tender, non-distended with normoactive bowel sounds. +L CVA tenderness M-S:  Strength and tone appear normal for age. Lower extremities:without edema or ischemic changes, no open wounds  Neuro: Alert and oriented X 3. Moves all extremities spontaneously. Psych:  Responds to questions appropriately with a normal affect. Dialysis Access: LUA AVF + Thrill + bruit  Dialysis Orders: Center: [place]  on TTS3 . EDW 50.5 kg HD Bath 2.0K 2.0Ca  Time 3 hours 45 minutes Heparin 2600 units per tx  BFR 400 DFR [place] 1.5    Aranesp 200 mcg IV weekly (Last dose 09/19/15) Venofer 50 mg IV weekly (Start 09/26/15)    Assessment/Plan: 1.  Hyperkalemia: Attempted Kayexalate in ER, unsuccessful. Will have HD today. K+ 5.9 Use 2.0 K Bath. 2.  UTI: Rocephin per primary 3.  ESRD -  TTS at [facility] 4.  Hypertension/volume  - on Amlodipine 10 mg Q hs at home. Has not received dose. Will have HD today. Attempt 4 liters as tolerated 5.  Anemia  - HGB 11.5. Give weekly dose of ESA/Venofer today 6.  Metabolic bone disease -  Ca 7.8 C Ca 9.16 Phos 9.0. Ca Acetate binders ordered. Dubious compliance.  7.  Nutrition - Renal diet/renal vitamin 8. SLE: per primary.   Disposition: For possible DC after HD.  Rita H. Owens Shark, NP-C 09/26/2015, 9:17 AM  [employer] [phone] I have seen and examined this patient and agree with plan per Juanell Fairly.  Admitted fo  sxs UTI and hyperkalemia.  On AB.  Will plan HD today and then she can be DC'd. Marland Kitchen Nyrie Sigal T,MD 09/26/2015 12:05 PM

## 2015-09-26 NOTE — Progress Notes (Signed)
Subjective: Patient was seen and examined at bedside this morning. She is not complaining of any abdominal pain, flank pain, or dysuria at present. Denies having any fevers, chills, nausea, or vomiting. No other complaints.   Objective: Vital signs in last 24 hours: Filed Vitals:   09/26/15 1530 09/26/15 1600 09/26/15 1630 09/26/15 1645  BP: 156/107 156/99 140/92 149/93  Pulse: 93 92 93 93  Temp:    98.3 F (36.8 C)  TempSrc:    Oral  Resp:    18  Height:      Weight:    49.8 kg (109 lb 12.6 oz)  SpO2:    96%   Weight change:   Intake/Output Summary (Last 24 hours) at 09/26/15 1727 Last data filed at 09/26/15 1645  Gross per 24 hour  Intake    240 ml  Output   3750 ml  Net  -3510 ml   Physical exam Cardiovascular: Regular rate and rhythm. No murmurs, rubs, or gallops. Palpable thrill at left upper extremity AVF. Pulmonary: Clear to ausculation bilaterally. No wheezes, rales, or rhonchi.  Abdominal: Soft. +BS. Mild suprapubic tenderness. No rebound or guarding.  Genitourinary: No CVA tenderness bilaterally.  Neurological: AAOx4 Extremities: No swelling.  Skin: warm and dry  Assessment/Plan: Active Problems:   Hyperkalemia   Acute UTI   Abdominal pain due to UTI: UA and symptoms consistent with UTI. Urine culture showing no growth in 1 day. Pt is afebrile and no signs of pyelonephritis at present. Renal US did not show any acute abnormality. Pt was given  IV ceftriaxone 1g x 2 doses. Vitals are stable and she is not complaining of any pain. Likely discharge today and patient will continue taking Bactrim twice daily for 6 more days as outpatient.  - Urine culture final result pending  Trichomoniasis and Bacterial Vaginosis: Received one dose of 2g metronidazole. Follow with PCP as outpatient. Discussed treatment of sexual partner(s) with patient and she stated she is not in contact with her sexual partner anymore.   Gonorrhea: Given 1 dose of Azithromycin 1 g.    Hyperkalemia: While it appears that potassium has only decreased from 6.1 to 5.9 after kayexalate, patient reports that labs were drawn at approximately the same time the kayexalate was give. Hyperkalemia is likely related to HD non-adherence.  -Patient is scheduled for HD today.   Menorrhagia: Hgb low at 9.7. Denies any symptoms of anemia. Could be related to platelet dysfunction with uremia. F/u with PCP as outpatient.   Gingival Bleeding: Could be manifestation of SLE or platelet dysfunction due to uremia.  Essential HTN: Elevated to 150/120 on admission. The patient has not been taking her prescribed Norvasc. - Patient was counseled on adherence - HOLD amlodipine until after HD today  ESRD secondary to Lupus Nephritis : On T,R,Sat. dialysis schedule. Patient missed Tuesday's dialysis session due to difficulty with child care and transportation. Social work is involved to assist her with making it to her dialysis sessions.   - Scheduled for HD today  SLE: Continue hydroxychloroquine 400 mg daily.  Hypocalcemia: Longstanding issue. 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  Anticipated discharge in approximately 0 day(s).   The patient does have a current PCP Lucious Groves, DO) and does need an [facility] hospital follow-up appointment after discharge.  The patient does not have  transportation limitations that hinder transportation to clinic appointments.  .Services Needed at time of discharge: Y = Yes, Blank = No PT:   OT:   RN:   Equipment:   Other:       [name] Leff, MD 09/26/2015, 5:27 PM

## 2015-09-26 NOTE — Discharge Instructions (Signed)
-  Start taking bactrim twice a day starting tomorrow for bladder infection for 6 more days.  -Please follow-up with our clinic

## 2015-09-26 NOTE — Discharge Summary (Signed)
Name: [name] MRN: [id number] DOB: [birth year] 34 y.o. PCP: Lucious Groves, DO  Date of Admission: 09/25/2015 11:41 AM Date of Discharge: 09/27/2015 Attending Physician: No att. providers found  Discharge Diagnosis:  Active Problems:   Hyperkalemia   Acute UTI  Discharge Medications:   Medication List    STOP taking these medications        ibuprofen 800 MG tablet  Commonly known as:  ADVIL,MOTRIN      TAKE these medications        ALEVE 220 MG Caps  Generic drug:  Naproxen Sodium  Take 2 capsules by mouth 2 (two) times daily as needed (pain).     amLODipine 5 MG tablet  Commonly known as:  NORVASC  Take 5mg  daily on non dialysis days     calcium acetate 667 MG capsule  Commonly known as:  PHOSLO  Take 1,334-2,001 mg by mouth 3 (three) times daily with meals. Takes 2 with snacks and 3 with meals     cinacalcet 30 MG tablet  Commonly known as:  SENSIPAR  Take 30 mg by mouth every other day.     darbepoetin 60 MCG/0.3ML Soln injection  Commonly known as:  ARANESP  Inject 0.3 mLs (60 mcg total) into the vein every Wednesday with hemodialysis.     feeding supplement (NEPRO CARB STEADY) Liqd  Take 237 mLs by mouth daily.     hydroxychloroquine 200 MG tablet  Commonly known as:  PLAQUENIL  Take 2 tablets (400 mg total) by mouth daily.     multivitamin Tabs tablet  Take 1 tablet by mouth daily.     sulfamethoxazole-trimethoprim 800-160 MG tablet  Commonly known as:  BACTRIM DS,SEPTRA DS  Take 1 tablet by mouth 2 (two) times daily.     triamcinolone cream 0.1 %  Commonly known as:  KENALOG  Apply 1 application topically 2 (two) times daily.        Disposition and follow-up:   [name] was discharged from [facility] in Good condition.  At the hospital follow up visit please address:  1.  Menorrhagia: Hgb low at 9.7. Patient denied any symptoms of anemia. She is ESRD, likely related to platelet dysfunction from uremia.    Gingival Bleeding: Could be manifestation of SLE or platelet dysfunction due to uremia.  2.  Labs / imaging needed at time of follow-up: CBC, iron studies   3.  Pending labs/ test needing follow-up: Urine culture   Follow-up Appointments: Follow-up Information    Follow up with Berline Lopes, MD On 10/09/2015.   Specialty:  Internal Medicine   Why:  3:45 PM   Contact information:   [place] [id number] [phone]       Discharge Instructions:  -Start taking bactrim twice a day starting tomorrow for bladder infection for 6 more days.  -Please follow-up with our clinic    Consultations: Treatment Team:  Axel Filler, MD Fleet Contras, MD  Procedures Performed:  Dg Chest 2 View  09/05/2015   CLINICAL DATA:  Shortness of breath and cough. Patient missed dialysis session.  EXAM: CHEST  2 VIEW  COMPARISON:  11/03/2014  FINDINGS: Cardiomediastinal silhouette is normal. Mediastinal contours appear intact.  There is no evidence of pneumothorax. There are bilateral pleural effusions, larger on the left, worsened from the prior radiograph. There is an associated bibasilar atelectasis. Pulmonary vasculature is prominent. More confluent airspace consolidation in the right middle lobe cannot be  excluded.  Osseous structures are without acute abnormality. Soft tissues are grossly normal.  IMPRESSION: Pulmonary edema.  Worsened bilateral pleural effusions, with associated subsegmental atelectasis of the lower lobes.  Airspace consolidation of the right middle lobe cannot be excluded.   Electronically Signed   By: Fidela Salisbury M.D.   On: 09/05/2015 07:29   US Renal  09/25/2015   CLINICAL DATA:  Abdomen pain, hematuria.  End-stage renal disease.  EXAM: RENAL / URINARY TRACT ULTRASOUND COMPLETE  COMPARISON:  February 01, 2014  FINDINGS: Right Kidney:  Length: 9.53 cm. There is diffuse increased echotexture. No mass or hydronephrosis visualized.  Left Kidney:   Length: 8.29 cm. There is diffuse increased echotexture. No mass or hydronephrosis visualized.  Bladder:  The bladder is decompressed limiting evaluation.  There is pleural effusion.  IMPRESSION: No acute abnormality identified.  Diffuse increased echotexture both kidneys consistent with end-stage renal disease.   Electronically Signed   By: Abelardo Diesel M.D.   On: 09/25/2015 19:23   Admission HPI: Ms. Flake is a 34 year old women with a PMH of SLE with nephritis on HD and polysubstance abuse who presents with abdominal pain and pain with urination since yesterday. She described the abdominal pain as a "pressure" in her lower abdomen. Her pain with urination, is associated with increased urinary frequency, although she typically does not make much urine to to ESRD. She has had some chills. She reports one episode of non-bloody, non-bilious emesis. She has felt dizzy in the last hour, although she thinks it's from the morphine she received in the ED. She denies any loss of consciousness, headaches, pain behind her eyes, shortness of breath, chest pain, pain or swelling in her legs, or new weakness.  She also reports vaginal bleeding and clotting over the course of 2 months. As mentioned, she denies any chronic dizziness or shortness of breath. She has had two uncomplicated births. She also reports bleeding gums that are also longstanding.   She did not arrive for her dialysis session last Tuesday, and she reports that she consistently misses this session on her TTS schedule. She said she misses this due to "child care" issues. She also reports consistent non-adherence with her Norvasc.   In the ED, she was noted to be hyperkalemic to 6.2, which came down only to 6.1 after administration of kayexalate. However, the chronicity of the BMET lab draws and kayexalate administration is unclear after talking with the patient. She reports three bowel movements since kayexalate administration. She was also  hypertensive to 152/120. UA was consistent with UTI, and she was started on IV ceftriaxone. Genitourinary exam in ED revealed thin and watery dark blood in vaginal vault without clot.   Hospital Course by problem list: Active Problems:   Hyperkalemia   Acute UTI   UTI and gynecological infections: Patient diagnosed with complicated UTI given immunosuppression on Plaquenil. Urinalysis was consistent with pyuria and bacteriuria. She was also found to have trichomoniasis, bacterial vaginosis, and gonorrhea. Patient was treated with 2 doses ofceftriaxone 1g, one dose of metronidazole 2g, and 1 dose of Azithromycin 1 g. No signs of pyelonephritis. Renal US did not show any acute abnormality. On the day of discharge, patient was afebrile and did not complain of any fever, chills, nausea, vomiting, abdominal pain, flank pain, or dysuria. She was transitioned to oral antibiotics: Bactrim twice daily for 6 more days as outpatient. She has a follow up appointment at our internal medicine clinic on 10/09/15.   Hyperkalemia  related to HD non-adherence: K 6.1 on admission, no new EKG changes. Patient was given Kayexalate in the ED and went for HD during her hospital stay.   Discharge Vitals:   BP 135/108 mmHg  Pulse 62  Temp(Src) 98.5 F (36.9 C) (Oral)  Resp 18  Ht 5\' 4"  (1.626 m)  Wt 51.075 kg (112 lb 9.6 oz)  BMI 19.32 kg/m2  SpO2 93%  LMP 08/29/2015 (Exact Date)  Discharge Labs:  No results found for this or any previous visit (from the past 24 hour(s)).  Signed: Shela Leff, MD 09/27/2015, 3:02 PM    Services Ordered on Discharge: None Equipment Ordered on Discharge: None

## 2015-09-27 ENCOUNTER — Telehealth (HOSPITAL_COMMUNITY): Payer: Self-pay

## 2015-09-27 NOTE — Telephone Encounter (Signed)
Results received from [facility].  (+) Gonorrhea. Treated with Zithromax and Rocephin.  DHHS form completed and faxed.  9/30 @ 10:02  LVM requesting callback.

## 2015-09-29 ENCOUNTER — Telehealth (HOSPITAL_BASED_OUTPATIENT_CLINIC_OR_DEPARTMENT_OTHER): Payer: Self-pay | Admitting: Emergency Medicine

## 2015-10-01 ENCOUNTER — Telehealth: Payer: Self-pay | Admitting: *Deleted

## 2015-10-01 NOTE — Telephone Encounter (Signed)
Called for tcm, got vmail, will cont to call

## 2015-10-04 ENCOUNTER — Telehealth: Payer: Self-pay | Admitting: *Deleted

## 2015-10-04 NOTE — Telephone Encounter (Signed)
No answer for tcm

## 2015-10-08 NOTE — Telephone Encounter (Signed)
No answer 10/5

## 2015-10-09 ENCOUNTER — Ambulatory Visit: Payer: Medicaid Other | Admitting: Internal Medicine

## 2015-10-15 DIAGNOSIS — Z992 Dependence on renal dialysis: Secondary | ICD-10-CM | POA: Insufficient documentation

## 2015-11-02 ENCOUNTER — Telehealth (HOSPITAL_COMMUNITY): Payer: Self-pay

## 2015-11-02 NOTE — Telephone Encounter (Signed)
Unable to contact pt by mail or telephone. Unable to communicate lab results or treatment changes. 

## 2015-11-15 ENCOUNTER — Encounter (HOSPITAL_COMMUNITY): Payer: Self-pay | Admitting: Emergency Medicine

## 2015-11-15 DIAGNOSIS — I120 Hypertensive chronic kidney disease with stage 5 chronic kidney disease or end stage renal disease: Secondary | ICD-10-CM | POA: Insufficient documentation

## 2015-11-15 DIAGNOSIS — R05 Cough: Secondary | ICD-10-CM | POA: Diagnosis present

## 2015-11-15 DIAGNOSIS — R0602 Shortness of breath: Secondary | ICD-10-CM | POA: Insufficient documentation

## 2015-11-15 DIAGNOSIS — Z9115 Patient's noncompliance with renal dialysis: Secondary | ICD-10-CM | POA: Diagnosis not present

## 2015-11-15 DIAGNOSIS — Z79899 Other long term (current) drug therapy: Secondary | ICD-10-CM | POA: Insufficient documentation

## 2015-11-15 DIAGNOSIS — J90 Pleural effusion, not elsewhere classified: Secondary | ICD-10-CM | POA: Diagnosis not present

## 2015-11-15 DIAGNOSIS — Z992 Dependence on renal dialysis: Secondary | ICD-10-CM | POA: Diagnosis not present

## 2015-11-15 DIAGNOSIS — R0902 Hypoxemia: Secondary | ICD-10-CM | POA: Diagnosis not present

## 2015-11-15 DIAGNOSIS — F17210 Nicotine dependence, cigarettes, uncomplicated: Secondary | ICD-10-CM | POA: Insufficient documentation

## 2015-11-15 DIAGNOSIS — N186 End stage renal disease: Secondary | ICD-10-CM | POA: Diagnosis not present

## 2015-11-15 DIAGNOSIS — M3214 Glomerular disease in systemic lupus erythematosus: Secondary | ICD-10-CM | POA: Diagnosis not present

## 2015-11-15 MED ORDER — ALBUTEROL SULFATE (2.5 MG/3ML) 0.083% IN NEBU
INHALATION_SOLUTION | RESPIRATORY_TRACT | Status: AC
  Filled 2015-11-15: qty 6

## 2015-11-15 MED ORDER — ALBUTEROL SULFATE (2.5 MG/3ML) 0.083% IN NEBU
5.0000 mg | INHALATION_SOLUTION | Freq: Once | RESPIRATORY_TRACT | Status: AC
  Administered 2015-11-15: 5 mg via RESPIRATORY_TRACT

## 2015-11-15 NOTE — ED Notes (Signed)
Pt. reports persistent productive cough with SOB and chest congestion onset last week , she missed her hemodialysis treatment today , denies fever or chills.

## 2015-11-16 ENCOUNTER — Emergency Department (HOSPITAL_COMMUNITY): Payer: Medicaid Other

## 2015-11-16 ENCOUNTER — Non-Acute Institutional Stay (HOSPITAL_COMMUNITY)
Admission: EM | Admit: 2015-11-16 | Discharge: 2015-11-16 | Disposition: A | Discharge status: Home / Self Care | Payer: Medicaid Other | Attending: Student in an Organized Health Care Education/Training Program | Admitting: Student in an Organized Health Care Education/Training Program

## 2015-11-16 DIAGNOSIS — J90 Pleural effusion, not elsewhere classified: Secondary | ICD-10-CM | POA: Diagnosis present

## 2015-11-16 DIAGNOSIS — E8770 Fluid overload, unspecified: Secondary | ICD-10-CM | POA: Diagnosis present

## 2015-11-16 DIAGNOSIS — M329 Systemic lupus erythematosus, unspecified: Secondary | ICD-10-CM | POA: Diagnosis present

## 2015-11-16 DIAGNOSIS — R0602 Shortness of breath: Secondary | ICD-10-CM | POA: Diagnosis present

## 2015-11-16 DIAGNOSIS — R053 Chronic cough: Secondary | ICD-10-CM

## 2015-11-16 DIAGNOSIS — M3214 Glomerular disease in systemic lupus erythematosus: Secondary | ICD-10-CM | POA: Diagnosis present

## 2015-11-16 DIAGNOSIS — R05 Cough: Secondary | ICD-10-CM

## 2015-11-16 LAB — BASIC METABOLIC PANEL
Anion gap: 14 (ref 5–15)
BUN: 81 mg/dL — AB (ref 6–20)
CHLORIDE: 99 mmol/L — AB (ref 101–111)
CO2: 24 mmol/L (ref 22–32)
CREATININE: 13.37 mg/dL — AB (ref 0.44–1.00)
Calcium: 7.1 mg/dL — ABNORMAL LOW (ref 8.9–10.3)
GFR calc Af Amer: 4 mL/min — ABNORMAL LOW (ref >60)
GFR calc non Af Amer: 3 mL/min — ABNORMAL LOW (ref >60)
Glucose, Bld: 85 mg/dL (ref 65–99)
POTASSIUM: 5 mmol/L (ref 3.5–5.1)
Sodium: 137 mmol/L (ref 135–145)

## 2015-11-16 LAB — CBC WITH DIFFERENTIAL/PLATELET
BASOS PCT: 1 %
Basophils Absolute: 0 10*3/uL (ref 0.0–0.1)
EOS PCT: 2 %
Eosinophils Absolute: 0.1 10*3/uL (ref 0.0–0.7)
HEMATOCRIT: 35.1 % — AB (ref 36.0–46.0)
HEMOGLOBIN: 10.7 g/dL — AB (ref 12.0–15.0)
LYMPHS PCT: 26 %
Lymphs Abs: 1.1 10*3/uL (ref 0.7–4.0)
MCH: 26.8 pg (ref 26.0–34.0)
MCHC: 30.5 g/dL (ref 30.0–36.0)
MCV: 88 fL (ref 78.0–100.0)
MONOS PCT: 8 %
Monocytes Absolute: 0.3 10*3/uL (ref 0.1–1.0)
NEUTROS ABS: 2.7 10*3/uL (ref 1.7–7.7)
NEUTROS PCT: 63 %
Platelets: 92 10*3/uL — ABNORMAL LOW (ref 150–400)
RBC: 3.99 MIL/uL (ref 3.87–5.11)
RDW: 17.4 % — ABNORMAL HIGH (ref 11.5–15.5)
WBC: 4.2 10*3/uL (ref 4.0–10.5)

## 2015-11-16 LAB — TROPONIN I
TROPONIN I: 0.05 ng/mL — AB (ref <0.031)
TROPONIN I: 0.05 ng/mL — AB (ref <0.031)

## 2015-11-16 MED ORDER — SODIUM POLYSTYRENE SULFONATE 15 GM/60ML PO SUSP
30.0000 g | Freq: Once | ORAL | Status: AC
  Administered 2015-11-16: 30 g via ORAL
  Filled 2015-11-16: qty 120

## 2015-11-16 NOTE — ED Notes (Signed)
Dr Dina Rich in room with pt. Pt wishes to stay here and get dialysis.

## 2015-11-16 NOTE — Progress Notes (Signed)
Patient completed her prescribed treatment. Patient tolerated treatment well. Vital signs and labs have been reviewed. Patient seen by Dr Jonnie Finner. Patient discharged home after hemodialysis.

## 2015-11-16 NOTE — Procedures (Signed)
  I was present at this dialysis session, have reviewed the session itself and made  appropriate changes Kelly Splinter MD [place] pager [phone]    cell [phone] 11/16/2015, 12:31 PM

## 2015-11-16 NOTE — Progress Notes (Signed)
Pt seen on HD.  2.3 above EDW CKR chronic small bilateral pleural effusions mild pul edema; dim BS at bases with crackles;  sats on room air ok - came to HD on room air - O2 sats mid to high 90s but tachypneic- resp rate 30s - O2 added.   WBC and diff wnl, no fevers  K 5 - given kayexalate- not clear why - stomach feels bad now Runs 2-3 hrs of 3.75 treatment - arrives late; and signs off b/c of cramping States last time she used cocaine was Halloween BP ^^ 150s/110s similar to outpt Bs with ^ HR 1002-110  Anticipate d/c from HD if stable. Reinforced cocaine abstinence. [name] Hailey, PA-C  Pt seen, examined and agree w A/P as above. ESRD patient missed HD presenting with cough, SOB and mild-mod hypoxemia. CXR shows mild-mod pulmonary edema. She is getting dialysis now and breathing is better, check SaO2 after HD.  Don't see a reason for admission from renal standpoint.  Can be dc'd home.  Have d/w medical team.  Kelly Splinter MD [facility] pager [phone]    cell [phone] 11/16/2015, 12:25 PM

## 2015-11-16 NOTE — ED Notes (Signed)
Bedside commode placed in room for pt after kayexelate admin

## 2015-11-16 NOTE — ED Notes (Addendum)
Walked pt from her room A2 in front of the nurses desk to A8 and back to her room. Pt had a pulse ox on her finger. Pt o2 stat started out a 98% with pulse 105. When pt came back to the room her stats was 93% and her pulse was 116. Dr Dina Rich informed

## 2015-11-16 NOTE — ED Provider Notes (Signed)
CSN: [id number]     Arrival date & time 11/15/15  2328 History  By signing my name below, I, [name], attest that this documentation has been prepared under the direction and in the presence of [name] Hacker, MD. Electronically Signed: Jolayne [name], Scribe. 11/16/2015. 12:47 AM.    Chief Complaint  Patient presents with  . Cough     The history is provided by the patient. No language interpreter was used.    HPI Comments: [name] is a 34 y.o. female who is on dialysis because of Lupus, who presents to the Emergency Department complaining of SOB,  cough and fatigue onset earlier tonight due to fluid build up resulting from the fact that she missed her dialysis 2 days ago. Pt states that her SOB is worsened when she lays flat. Pt's last dialysis was four days ago (Tuesday). She normally dialyzes Tuesday, Thursday, and Saturday Pt states that she frequently misses her dialysis and reports that her next dialysis is 12:30 PM today. Pt denies fever, leg swelling, and chest pain. Her kidney doctor is Dr. Joelyn [name].   Past Medical History  Diagnosis Date  . Lupus (systemic lupus erythematosus) (HCC)     Previously followed with Dr. Charlestine Night, has not followed up recently  . 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 S/P pericardiocentesis 01/17/2013    H/o pericardial effusion with tamponade 2006   . H/O pleural effusion 01/17/2013  . H/O pericarditis 01/17/2013  . Polysubstance abuse     cocaine, MJ, tobacco  . 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 Bascilic vein transposition Left 02/05/2014    Procedure: [place];  Surgeon: [name], MD;  Location: [facility];  Service: Vascular;  Laterality: Left;  Annell Greening Right  01/31/2014    Procedure: DIALYSIS CATHETER;  Surgeon: Serafina Mitchell, MD;  Location: [facility] CATH LAB;  Service: Cardiovascular;  Laterality: Right;   No family history on file. Social History  Substance Use Topics  . Smoking status: Current Some Day Smoker -- 0.25 packs/day for 15 years  . Smokeless tobacco: Never Used     Comment: quit smoking s/p hospital discharge/ SMOKES ABOUT 2-3 CIGARETTES A DAY  . Alcohol Use: Yes     Comment: occ   OB History    No data available     Review of Systems  Constitutional: Positive for fatigue. Negative for fever.  Respiratory: Positive for cough, chest tightness and shortness of breath.   Cardiovascular: Negative for chest pain and leg swelling.  Gastrointestinal: Negative for vomiting.  All other systems reviewed and are negative.     Allergies  Tobramycin sulfate  Home Medications   Prior to Admission medications   Medication Sig Start Date End Date Taking? Authorizing Provider  amLODipine (NORVASC) 5 MG tablet Take 5mg  daily on non dialysis days 06/24/15  Yes Lucious Groves, DO  calcium acetate (PHOSLO) 667 MG capsule Take 1,334-2,001 mg by mouth 3 (three) times daily with meals. Takes 2 with snacks and 3 with meals   Yes Historical Provider, MD  darbepoetin (ARANESP) 60 MCG/0.3ML SOLN injection Inject 0.3 mLs (60 mcg total) into the vein every Wednesday with hemodialysis. Patient taking differently: Inject 60 mcg into the vein every 7 (seven) days.  02/08/14  Yes Jessee Avers, MD  hydroxychloroquine (PLAQUENIL) 200 MG tablet Take 2 tablets (400 mg total) by mouth daily. 06/24/15  Yes Lucious Groves, DO  multivitamin (RENA-VIT) TABS tablet Take 1 tablet by mouth daily.   Yes Historical Provider, MD  sulfamethoxazole-trimethoprim (BACTRIM DS,SEPTRA DS) 800-160 MG tablet Take 1 tablet by mouth 2 (two) times daily. Patient not taking: Reported on 11/16/2015 09/27/15   Shela Leff, MD  triamcinolone cream (KENALOG) 0.1 % Apply 1  application topically 2 (two) times daily. Patient not taking: Reported on 11/16/2015 06/24/15   Lucious Groves, DO   BP 142/110 mmHg  Pulse 97  Temp(Src) 98.2 F (36.8 C) (Oral)  Resp 32  Ht 5\' 2"  (1.575 m)  Wt 117 lb (53.071 kg)  BMI 21.39 kg/m2  SpO2 95%  LMP 10/17/2015 (Approximate) Physical Exam  Constitutional: She is oriented to person, place, and time. No distress.  HENT:  Head: Normocephalic and atraumatic.  Cardiovascular: Normal rate, regular rhythm and normal heart sounds.   No murmur heard. Fistula left upper extremity with positive thrill  Pulmonary/Chest: Effort normal. No respiratory distress. She has no wheezes.  Crackles bilateral bases  Abdominal: Soft. Bowel sounds are normal. There is no tenderness. There is no rebound.  Musculoskeletal: She exhibits no edema.  Neurological: She is alert and oriented to person, place, and time.  Skin: Skin is warm and dry.  Psychiatric: She has a normal mood and affect.  Nursing note and vitals reviewed.   ED Course  Procedures  DIAGNOSTIC STUDIES:    Oxygen Saturation is 95% on RA, adequate by my interpretation.   COORDINATION OF CARE:  6:25 AM Discussed treatment plan with pt at bedside and pt agreed to plan.     Labs Review Labs Reviewed  CBC WITH DIFFERENTIAL/PLATELET - Abnormal; Notable for the following:    Hemoglobin 10.7 (*)    HCT 35.1 (*)    RDW 17.4 (*)    Platelets 92 (*)    All other components within normal limits  BASIC METABOLIC PANEL - Abnormal; Notable for the following:    Chloride 99 (*)    BUN 81 (*)    Creatinine, Ser 13.37 (*)    Calcium 7.1 (*)    GFR calc non Af Amer 3 (*)    GFR calc Af Amer 4 (*)    All other components within normal limits  TROPONIN I - Abnormal; Notable for the following:    Troponin I 0.05 (*)    All other components within normal limits  TROPONIN I - Abnormal; Notable for the following:    Troponin I 0.05 (*)    All other components within normal limits   URINE RAPID DRUG SCREEN, HOSP PERFORMED    Imaging Review Dg Chest 2 View  11/16/2015  CLINICAL DATA:  Productive cough with shortness of breath and chest tightness. Fatigue. EXAM: CHEST  2 VIEW COMPARISON:  09/05/2015 FINDINGS: Chronic bilateral pleural effusions with decreased pleural volume on the left and minimally increased pleural volume on the right. Associated bibasilar opacities, likely atelectasis. Cardiomediastinal contours are unchanged with cardiomegaly. Mild pulmonary edema is unchanged. There is no pneumothorax. IMPRESSION: Chronic pleural effusions, decreased on the left and increased on the right. Associated bibasilar opacities are likely compressive atelectasis. Unchanged cardiomegaly and pulmonary edema. Electronically Signed   By: Jeb Levering M.D.   On: 11/16/2015 01:21   I have personally reviewed and evaluated these images and lab results as part of my medical decision-making.  EKG Interpretation   Date/Time:  Saturday November 16 2015 01:01:58 EST Ventricular Rate:  96 PR Interval:  132 QRS Duration: 82 QT Interval:  431 QTC Calculation: 545 R Axis:   91 Text Interpretation:  Sinus rhythm Left atrial enlargement Borderline  right axis deviation Probable left ventricular hypertrophy Borderline T  abnormalities, inferior leads Prolonged QT interval Confirmed by Ota Ebersole   MD, Jeramy Dimmick ([id number]) on 11/16/2015 1:05:13 AM      MDM   Final diagnoses:  Shortness of breath  Hypervolemia, unspecified hypervolemia type    Patient presents with cough and concerns for volume overload. Missed dialysis on Thursday. Is in no acute distress. Satting 95% on room air. No significant evidence of volume overload. Initial workup is largely reassuring. Patient's troponin was minimally elevated at 0.05. I feel this likely reflects the patient's renal dysfunction. Repeat troponin pending.  Patient was able to ambulate and maintain pulse ox of greater than 93%. She did become  tachycardic. Potassium is 5.0. Patient was given Kayexalate. Discussed with patient that if repeat troponin comes back stable, she can likely be discharged for dialysis later today. She is comfortable with this plan.  5:30 AM  Repeat troponin is stable. On recheck, patient's respiratory rate is 32. She appears more tachypnea and pulmonary exam with persistent crackles. Discussed with patient admission for more urgent dialysis. Patient feels worse and is unsure whether she can function at home until dialysis. Discussed the patient with Dr. Justin Mend. Patient will be dialyzed later this morning.  Patient belongs to internal medicine teaching. Discussed with resident. She will admit.  I personally performed the services described in this documentation, which was scribed in my presence. The recorded information has been reviewed and is accurate.    [name] Hacker, MD 11/16/15 [phone]

## 2016-02-03 ENCOUNTER — Encounter: Payer: Self-pay | Admitting: Internal Medicine

## 2016-02-03 ENCOUNTER — Ambulatory Visit (INDEPENDENT_AMBULATORY_CARE_PROVIDER_SITE_OTHER): Payer: Medicaid Other | Admitting: Internal Medicine

## 2016-02-03 VITALS — BP 139/95 | HR 98 | Temp 98.1°F | Resp 20 | Ht 64.0 in | Wt 112.0 lb

## 2016-02-03 DIAGNOSIS — J209 Acute bronchitis, unspecified: Secondary | ICD-10-CM | POA: Diagnosis not present

## 2016-02-03 MED ORDER — GUAIFENESIN-CODEINE 100-10 MG/5ML PO SOLN: 5.0000 mL | Freq: Three times a day (TID) | ORAL | Status: DC | PRN

## 2016-02-03 MED ORDER — ALBUTEROL SULFATE HFA 108 (90 BASE) MCG/ACT IN AERS: 1.0000 | INHALATION_SPRAY | Freq: Four times a day (QID) | RESPIRATORY_TRACT | Status: DC | PRN

## 2016-02-03 NOTE — Progress Notes (Signed)
[facility] Subjective:   Patient ID: [name] female   DOB: [birth year] 35 y.o.   MRN: [id number]  HPI: [name] is a 34 y.o. female with a PMH detailed below who presents for evaluation of a cold.  She reports that she received a flu shot a dialysis a few weeks ago, then about 2 weeks ago she started not feeling well and began to have a cough productive of yellow sputum.  She was recently told by her nephrologist to take a leftover 1 week course of doxycycline that she has but she has not started that yet.  She denies any fevers or SOB. She has some mild wheezing.  Multiple of her children have also been sick with similar symptoms.    Past Medical History  Diagnosis Date  . Lupus (systemic lupus erythematosus) (HCC)     Previously followed with Dr. Charlestine Night, has not followed up recently  . 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 S/P pericardiocentesis 01/17/2013    H/o pericardial effusion with tamponade 2006   . H/O pleural effusion 01/17/2013  . H/O pericarditis 01/17/2013  . Polysubstance abuse     cocaine, MJ, tobacco  . 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 albuterol (PROVENTIL HFA;VENTOLIN HFA) 108 (90 Base) MCG/ACT inhaler Inhale 1-2 puffs into the lungs every 6 (six) hours as needed for wheezing or shortness of breath. 6.7 g 0  . amLODipine (NORVASC) 5 MG tablet Take 5mg  daily on non dialysis days 45 tablet 1  . calcium acetate (PHOSLO) 667 MG capsule Take 1,334-2,001 mg by mouth 3 (three) times daily with meals. Takes 2 with snacks and 3 with meals    . darbepoetin (ARANESP) 60 MCG/0.3ML SOLN injection Inject 0.3 mLs (60 mcg total) into the vein every Wednesday with hemodialysis. (Patient  taking differently: Inject 60 mcg into the vein every 7 (seven) days. ) 4.2 mL 0  . doxycycline (ADOXA) 100 MG tablet Take 100 mg by mouth 2 (two) times daily.    Marland Kitchen guaiFENesin-codeine 100-10 MG/5ML syrup Take 5 mLs by mouth 3 (three) times daily as needed for cough. 120 mL 1  . hydroxychloroquine (PLAQUENIL) 200 MG tablet Take 2 tablets (400 mg total) by mouth daily. 60 tablet 2  . multivitamin (RENA-VIT) TABS tablet Take 1 tablet by mouth daily.    Marland Kitchen triamcinolone cream (KENALOG) 0.1 % Apply 1 application topically 2 (two) times daily. (Patient not taking: Reported on 11/16/2015) 30 g 0   No current facility-administered medications for this visit.   No family history on file. Social History   Social History  . Marital Status: Single    Spouse Name: N/A  . Number of Children: N/A  . Years of Education: N/A   Social History Main Topics  . Smoking status: Current Some Day Smoker -- 0.25 packs/day for 15 years  . Smokeless tobacco: Never Used     Comment: quit smoking s/p hospital discharge/ SMOKES ABOUT 2-3 CIGARETTES A DAY  . Alcohol Use: Yes     Comment: occ  . Drug Use: 5.00 per week    Special: Marijuana  . Sexual Activity: Yes   Other Topics Concern  . None   Social History Narrative   Review of Systems: Review of Systems  Constitutional: Positive for malaise/fatigue. Negative for fever and chills.  HENT: Positive for congestion.   Respiratory: Positive for cough and sputum production. Negative for hemoptysis and shortness of breath.   Cardiovascular: Negative for chest pain.  Gastrointestinal: Negative for abdominal pain.  Genitourinary: Negative for hematuria.  Musculoskeletal: Negative for myalgias.    Objective:  Physical Exam: Filed Vitals:   02/03/16 1001  BP: 139/95  Pulse: 98  Temp: 98.1 F (36.7 C)  TempSrc: Oral  Resp: 20  Height: 5\' 4"  (1.626 m)  Weight: 112 lb (50.803 kg)  SpO2: 95%   Physical Exam  Constitutional: She is well-developed,  well-nourished, and in no distress.  Cardiovascular: Normal rate and regular rhythm.   Pulmonary/Chest: Effort normal. She has wheezes (mild expiratory).  Nursing note and vitals reviewed.   Assessment & Plan:  Case discussed with Dr. Dareen Piano  Acute bronchitis - Rx Guaifenesin-Codeine, albuterol inhaler. - Recommended rest - May take Doxycycline but likely viral -Call in new fevers, getting worse or not better in 1 week and will consider CXR    Medications Ordered Meds ordered this encounter  Medications  . guaiFENesin-codeine 100-10 MG/5ML syrup    Sig: Take 5 mLs by mouth 3 (three) times daily as needed for cough.    Dispense:  120 mL    Refill:  1  . doxycycline (ADOXA) 100 MG tablet    Sig: Take 100 mg by mouth 2 (two) times daily.  Marland Kitchen albuterol (PROVENTIL HFA;VENTOLIN HFA) 108 (90 Base) MCG/ACT inhaler    Sig: Inhale 1-2 puffs into the lungs every 6 (six) hours as needed for wheezing or shortness of breath.    Dispense:  6.7 g    Refill:  0   Other Orders No orders of the defined types were placed in this encounter.   Follow Up: Return in about 3 months (around 05/02/2016), or if symptoms worsen or fail to improve.

## 2016-02-03 NOTE — Progress Notes (Signed)
Internal Medicine Clinic Attending  Case discussed with Dr. Hoffman soon after the resident saw the patient.  We reviewed the resident's history and exam and pertinent patient test results.  I agree with the assessment, diagnosis, and plan of care documented in the resident's note. 

## 2016-02-03 NOTE — Patient Instructions (Signed)
General Instructions: If not feeling better in 1 week or if you feel worse call me and we will order a Chest X-Ray  Please bring your medicines with you each time you come to clinic.  Medicines may include prescription medications, over-the-counter medications, herbal remedies, eye drops, vitamins, or other pills.   Progress Toward Treatment Goals:  Treatment Goal 03/15/2014  Stop smoking smoking less    Self Care Goals & Plans:  Self Care Goal 06/24/2015  Manage my medications take my medicines as prescribed; bring my medications to every visit; refill my medications on time  Monitor my health keep track of my blood glucose  Eat healthy foods -  Be physically active (No Data)  Stop smoking (No Data)    No flowsheet data found.   Care Management & Community Referrals:  No flowsheet data found.    Acute Bronchitis Bronchitis is inflammation of the airways that extend from the windpipe into the lungs (bronchi). The inflammation often causes mucus to develop. This leads to a cough, which is the most common symptom of bronchitis.  In acute bronchitis, the condition usually develops suddenly and goes away over time, usually in a couple weeks. Smoking, allergies, and asthma can make bronchitis worse. Repeated episodes of bronchitis may cause further lung problems.  CAUSES Acute bronchitis is most often caused by the same virus that causes a cold. The virus can spread from person to person (contagious) through coughing, sneezing, and touching contaminated objects. SIGNS AND SYMPTOMS   Cough.   Fever.   Coughing up mucus.   Body aches.   Chest congestion.   Chills.   Shortness of breath.   Sore throat.  DIAGNOSIS  Acute bronchitis is usually diagnosed through a physical exam. Your health care provider will also ask you questions about your medical history. Tests, such as chest X-rays, are sometimes done to rule out other conditions.  TREATMENT  Acute bronchitis  usually goes away in a couple weeks. Oftentimes, no medical treatment is necessary. Medicines are sometimes given for relief of fever or cough. Antibiotic medicines are usually not needed but may be prescribed in certain situations. In some cases, an inhaler may be recommended to help reduce shortness of breath and control the cough. A cool mist vaporizer may also be used to help thin bronchial secretions and make it easier to clear the chest.  HOME CARE INSTRUCTIONS  Get plenty of rest.   Drink enough fluids to keep your urine clear or pale yellow (unless you have a medical condition that requires fluid restriction). Increasing fluids may help thin your respiratory secretions (sputum) and reduce chest congestion, and it will prevent dehydration.   Take medicines only as directed by your health care provider.  If you were prescribed an antibiotic medicine, finish it all even if you start to feel better.  Avoid smoking and secondhand smoke. Exposure to cigarette smoke or irritating chemicals will make bronchitis worse. If you are a smoker, consider using nicotine gum or skin patches to help control withdrawal symptoms. Quitting smoking will help your lungs heal faster.   Reduce the chances of another bout of acute bronchitis by washing your hands frequently, avoiding people with cold symptoms, and trying not to touch your hands to your mouth, nose, or eyes.   Keep all follow-up visits as directed by your health care provider.  SEEK MEDICAL CARE IF: Your symptoms do not improve after 1 week of treatment.  SEEK IMMEDIATE MEDICAL CARE IF:  You develop an  increased fever or chills.   You have chest pain.   You have severe shortness of breath.  You have bloody sputum.   You develop dehydration.  You faint or repeatedly feel like you are going to pass out.  You develop repeated vomiting.  You develop a severe headache. MAKE SURE YOU:   Understand these instructions.  Will  watch your condition.  Will get help right away if you are not doing well or get worse.   This information is not intended to replace advice given to you by your health care provider. Make sure you discuss any questions you have with your health care provider.   Document Released: 01/21/2005 Document Revised: 01/04/2015 Document Reviewed: 06/06/2013 Elsevier Interactive Patient Education [employer].

## 2016-02-03 NOTE — Assessment & Plan Note (Signed)
-   Rx Guaifenesin-Codeine, albuterol inhaler. - Recommended rest - May take Doxycycline but likely viral -Call in new fevers, getting worse or not better in 1 week and will consider CXR

## 2016-02-17 ENCOUNTER — Emergency Department (HOSPITAL_COMMUNITY)
Admission: EM | Admit: 2016-02-17 | Discharge: 2016-02-17 | Disposition: A | Discharge status: Home / Self Care | Payer: Medicaid Other | Attending: Emergency Medicine | Admitting: Emergency Medicine

## 2016-02-17 ENCOUNTER — Encounter (HOSPITAL_COMMUNITY): Payer: Self-pay

## 2016-02-17 ENCOUNTER — Emergency Department (HOSPITAL_COMMUNITY): Payer: Medicaid Other

## 2016-02-17 ENCOUNTER — Other Ambulatory Visit: Payer: Self-pay

## 2016-02-17 DIAGNOSIS — Z792 Long term (current) use of antibiotics: Secondary | ICD-10-CM | POA: Diagnosis not present

## 2016-02-17 DIAGNOSIS — E8779 Other fluid overload: Secondary | ICD-10-CM

## 2016-02-17 DIAGNOSIS — R0602 Shortness of breath: Secondary | ICD-10-CM | POA: Diagnosis present

## 2016-02-17 DIAGNOSIS — E8770 Fluid overload, unspecified: Secondary | ICD-10-CM | POA: Insufficient documentation

## 2016-02-17 DIAGNOSIS — Z8739 Personal history of other diseases of the musculoskeletal system and connective tissue: Secondary | ICD-10-CM | POA: Diagnosis not present

## 2016-02-17 DIAGNOSIS — I120 Hypertensive chronic kidney disease with stage 5 chronic kidney disease or end stage renal disease: Secondary | ICD-10-CM | POA: Insufficient documentation

## 2016-02-17 DIAGNOSIS — N186 End stage renal disease: Secondary | ICD-10-CM | POA: Diagnosis not present

## 2016-02-17 DIAGNOSIS — F17200 Nicotine dependence, unspecified, uncomplicated: Secondary | ICD-10-CM | POA: Insufficient documentation

## 2016-02-17 DIAGNOSIS — Z7952 Long term (current) use of systemic steroids: Secondary | ICD-10-CM | POA: Insufficient documentation

## 2016-02-17 DIAGNOSIS — R000 Tachycardia, unspecified: Secondary | ICD-10-CM | POA: Insufficient documentation

## 2016-02-17 DIAGNOSIS — Z79899 Other long term (current) drug therapy: Secondary | ICD-10-CM | POA: Insufficient documentation

## 2016-02-17 DIAGNOSIS — Z992 Dependence on renal dialysis: Secondary | ICD-10-CM | POA: Diagnosis not present

## 2016-02-17 LAB — CBC
HEMATOCRIT: 35.5 % — AB (ref 36.0–46.0)
HEMOGLOBIN: 10.6 g/dL — AB (ref 12.0–15.0)
MCH: 26.5 pg (ref 26.0–34.0)
MCHC: 29.9 g/dL — AB (ref 30.0–36.0)
MCV: 88.8 fL (ref 78.0–100.0)
Platelets: 230 10*3/uL (ref 150–400)
RBC: 4 MIL/uL (ref 3.87–5.11)
RDW: 18 % — ABNORMAL HIGH (ref 11.5–15.5)
WBC: 4.6 10*3/uL (ref 4.0–10.5)

## 2016-02-17 LAB — I-STAT CHEM 8, ED
BUN: 49 mg/dL — AB (ref 6–20)
CALCIUM ION: 1.07 mmol/L — AB (ref 1.12–1.23)
CHLORIDE: 100 mmol/L — AB (ref 101–111)
CREATININE: 8.9 mg/dL — AB (ref 0.44–1.00)
GLUCOSE: 46 mg/dL — AB (ref 65–99)
HCT: 37 % (ref 36.0–46.0)
Hemoglobin: 12.6 g/dL (ref 12.0–15.0)
Potassium: 4.3 mmol/L (ref 3.5–5.1)
SODIUM: 141 mmol/L (ref 135–145)
TCO2: 27 mmol/L (ref 0–100)

## 2016-02-17 NOTE — ED Notes (Signed)
Patient here with increased shortness of breath, was to have dialysis Saturday but she states she was under weight and had no dialysis Thursday or Saturday. Last night couldn't lay flat and increased work of breathing noted

## 2016-02-17 NOTE — Discharge Instructions (Signed)
Please go directly to your dialysis center. They are expecting you for dialysis.

## 2016-02-17 NOTE — ED Provider Notes (Signed)
CSN: [id number]     Arrival date & time 02/17/16  [id number] History   First MD Initiated Contact with Patient 02/17/16 [phone]     Chief Complaint  Patient presents with  . Shortness of Breath     (Consider location/radiation/quality/duration/timing/severity/associated sxs/prior Treatment) HPI This is a 36 year old female with end-stage renal disease on dialysis Tuesday Thursday Saturday, history of pericarditis, history of SLE who presents today complaining of increased dyspnea. She describes it as worsening with laying down and exertion. She describes is similar to previous episodes of the volume overloaded. She states that she did go to her dialysis on Saturday but because she was underweight they did not take any extra fluid off. She denies any associated pain. She has had some cough over the past week. She states when she was seen for checkup last Monday she was given medicine for coughing. She states this has continued and is somewhat worse with some change in color. She is on doxycycline for an abscess which has cleared up. She is a smoker but reports decreasing her smoking. She denies any history of DVT. Past Medical History  Diagnosis Date  . Lupus (systemic lupus erythematosus) (HCC)     Previously followed with Dr. Charlestine Night, has not followed up recently  . 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 S/P pericardiocentesis 01/17/2013    H/o pericardial effusion with tamponade 2006   . H/O pleural effusion 01/17/2013  . H/O pericarditis 01/17/2013  . Polysubstance abuse     cocaine, MJ, tobacco  . Benign hypertension with ESRD (end-stage renal disease) ([place]) 03/15/2014   Past Surgical History  Procedure Laterality Date  . Bascilic vein transposition Left 02/05/2014    Procedure: [place];  Surgeon: [name], MD;  Location: [place];  Service: Vascular;  Laterality: Left;  Annell Greening Right 01/31/2014    Procedure: DIALYSIS CATHETER;  Surgeon: Serafina Mitchell, MD;  Location: [facility] C[facility] LAB;  Service: Cardiovascular;  Laterality: Right;   No family history on file. Social History  Substance Use Topics  . Smoking status: Current Some Day Smoker -- 0.25 packs/day for 15 years  . Smokeless tobacco: Never Used     Comment: quit smoking s/p hospital discharge/ SMOKES ABOUT 2-3 CIGARETTES A DAY  . Alcohol Use: Yes     Comment: occ   OB History    No data available     Review of Systems  All other systems reviewed and are negative.     Allergies  Tobramycin sulfate  Home Medications   Prior to Admission medications   Medication Sig Start Date End Date Taking? Authorizing Provider  albuterol (PROVENTIL HFA;VENTOLIN HFA) 108 (90 Base) MCG/ACT inhaler Inhale 1-2 puffs into the lungs every 6 (six) hours as needed for wheezing or shortness of breath. 02/03/16  Yes Lucious Groves, DO  amLODipine (NORVASC) 5 MG tablet Take 5mg  daily on non dialysis days 06/24/15  Yes Lucious Groves, DO  calcium acetate (PHOSLO) 667 MG capsule Take 1,334-2,001 mg by mouth 3 (three) times daily with meals. Takes 2 with snacks and 3 with meals   Yes Historical Provider, MD  cinacalcet (SENSIPAR) 30 MG tablet Take 30 mg by mouth daily.   Yes Historical Provider, MD  darbepoetin (ARANESP) 60 MCG/0.3ML SOLN injection Inject 0.3 mLs (60 mcg total) into the vein every Wednesday with hemodialysis.  Patient taking differently: Inject 60 mcg into the vein every 7 (seven) days.  02/08/14  Yes Jessee Avers, MD  doxycycline (ADOXA) 100 MG tablet Take 100 mg by mouth 2 (two) times daily.   Yes Historical Provider, MD  guaiFENesin-codeine 100-10 MG/5ML syrup Take 5 mLs by mouth 3 (three) times daily as needed for cough. 02/03/16  Yes Lucious Groves, DO  hydroxychloroquine (PLAQUENIL) 200 MG tablet Take 2 tablets (400 mg total) by mouth daily. 06/24/15  Yes Lucious Groves, DO  multivitamin (RENA-VIT) TABS tablet Take 1 tablet by mouth daily.   Yes Historical Provider, MD  triamcinolone cream (KENALOG) 0.1 % Apply 1 application topically 2 (two) times daily. 06/24/15  Yes Erik C Hoffman, DO   BP 144/112 mmHg  Pulse 97  Temp(Src) 97.5 F (36.4 C)  Resp 24  Ht 5\' 4"  (1.626 m)  Wt 51.12 kg  BMI 19.34 kg/m2  SpO2 95%  LMP 12/03/2015 (Approximate) Physical Exam  Constitutional: She is oriented to person, place, and time. She appears well-developed and well-nourished.  HENT:  Head: Normocephalic and atraumatic.  Right Ear: External ear normal.  Left Ear: External ear normal.  Nose: Nose normal.  Mouth/Throat: Oropharynx is clear and moist.  Eyes: Conjunctivae and EOM are normal. Pupils are equal, round, and reactive to light.  Neck: Normal range of motion. Neck supple.  Cardiovascular: Tachycardia present.   Pulmonary/Chest: She has no wheezes. She exhibits no tenderness.  Increased respiratory rate with bibasilar crackles  Abdominal: Soft. Bowel sounds are normal. She exhibits no distension.  Musculoskeletal: Normal range of motion. She exhibits no edema or tenderness.  Neurological: She is alert and oriented to person, place, and time. She displays normal reflexes. No cranial nerve deficit. She exhibits normal muscle tone. Coordination normal.  Skin: Skin is warm and dry.  Psychiatric: She has a normal mood and affect.  Nursing note and vitals reviewed.   ED Course  Procedures (including critical care time) Labs Review Labs Reviewed  CBC - Abnormal; Notable for the following:    Hemoglobin 10.6 (*)    HCT 35.5 (*)    MCHC 29.9 (*)    RDW 18.0 (*)    All other components within normal limits  I-STAT CHEM 8, ED    Imaging Review Dg Chest 2 View  02/17/2016  CLINICAL DATA:  Shortness of breath.  Weight loss. EXAM: CHEST  2 VIEW COMPARISON:  11/16/2015. FINDINGS: Mediastinum is stable. Cardiomegaly with pulmonary vascular prominence  and diffuse pulmonary interstitial prominence with bilateral pleural effusions again noted. Findings have progressed. Findings are consistent progressive congestive heart failure. IMPRESSION: Interim progression of congestive heart failure with pulmonary enters edema and bilateral pleural effusion Electronically Signed   By: [place]   On: 02/17/2016 09:00   I have personally reviewed and evaluated these images and lab results as part of my medical decision-making.   EKG Interpretation   Date/Time:  Monday February 17 2016 08:04:08 EST Ventricular Rate:  107 PR Interval:  160 QRS Duration: 78 QT Interval:  348 QTC Calculation: 464 R Axis:   93 Text Interpretation:  Sinus tachycardia Baseline wander T wave inversion  v5 and v6 Confirmed by Shantara Goosby MD, Andee Poles [id number]) on 02/17/2016 9:42:57 AM      MDM   Final diagnoses:  Other hypervolemia   35 year old female on dialysis presents today with increased dyspnea. Chest x-Shanon Seawright is significant for congestive heart failure with interstitial edema and bilateral pleural effusions. EKG is significant for  sinus tachycardia with T-wave inversion. Likely this is secondary to some heart strain. She is not having any chest pain or symptoms of cardiac ischemia currently. I-STAT performed and reported as normal. Nephrology is currently being consulted for dialysis. Discussed with Dr. Moshe Cipro and she has arranged for patient to have her dialysis at the outpatient dialysis center which she tends. Patient states that her daughter is available to drive her there. She is hemodynamically stable with normal potassium and appears stable to go for her outpatient dialysis.  [name], MD 02/17/16 [phone]

## 2016-03-03 ENCOUNTER — Emergency Department (HOSPITAL_COMMUNITY): Payer: Medicaid Other

## 2016-03-03 ENCOUNTER — Inpatient Hospital Stay (HOSPITAL_COMMUNITY)
Admission: EM | Admit: 2016-03-03 | Discharge: 2016-03-09 | DRG: 291 | Disposition: A | Discharge status: Home / Self Care | Payer: Medicaid Other | Attending: Infectious Disease | Admitting: Infectious Disease

## 2016-03-03 ENCOUNTER — Encounter (HOSPITAL_COMMUNITY): Payer: Self-pay | Admitting: Emergency Medicine

## 2016-03-03 DIAGNOSIS — I120 Hypertensive chronic kidney disease with stage 5 chronic kidney disease or end stage renal disease: Secondary | ICD-10-CM | POA: Diagnosis not present

## 2016-03-03 DIAGNOSIS — F1420 Cocaine dependence, uncomplicated: Secondary | ICD-10-CM | POA: Diagnosis present

## 2016-03-03 DIAGNOSIS — I132 Hypertensive heart and chronic kidney disease with heart failure and with stage 5 chronic kidney disease, or end stage renal disease: Secondary | ICD-10-CM | POA: Diagnosis present

## 2016-03-03 DIAGNOSIS — R059 Cough, unspecified: Secondary | ICD-10-CM | POA: Insufficient documentation

## 2016-03-03 DIAGNOSIS — I161 Hypertensive emergency: Secondary | ICD-10-CM | POA: Diagnosis present

## 2016-03-03 DIAGNOSIS — D638 Anemia in other chronic diseases classified elsewhere: Secondary | ICD-10-CM | POA: Diagnosis present

## 2016-03-03 DIAGNOSIS — Z992 Dependence on renal dialysis: Secondary | ICD-10-CM | POA: Diagnosis not present

## 2016-03-03 DIAGNOSIS — I5021 Acute systolic (congestive) heart failure: Secondary | ICD-10-CM | POA: Diagnosis not present

## 2016-03-03 DIAGNOSIS — G4700 Insomnia, unspecified: Secondary | ICD-10-CM | POA: Diagnosis present

## 2016-03-03 DIAGNOSIS — T405X1A Poisoning by cocaine, accidental (unintentional), initial encounter: Secondary | ICD-10-CM | POA: Insufficient documentation

## 2016-03-03 DIAGNOSIS — F419 Anxiety disorder, unspecified: Secondary | ICD-10-CM | POA: Diagnosis present

## 2016-03-03 DIAGNOSIS — R188 Other ascites: Secondary | ICD-10-CM | POA: Diagnosis present

## 2016-03-03 DIAGNOSIS — Z79899 Other long term (current) drug therapy: Secondary | ICD-10-CM | POA: Diagnosis not present

## 2016-03-03 DIAGNOSIS — M791 Myalgia, unspecified site: Secondary | ICD-10-CM | POA: Insufficient documentation

## 2016-03-03 DIAGNOSIS — M3214 Glomerular disease in systemic lupus erythematosus: Secondary | ICD-10-CM | POA: Diagnosis present

## 2016-03-03 DIAGNOSIS — R109 Unspecified abdominal pain: Secondary | ICD-10-CM | POA: Insufficient documentation

## 2016-03-03 DIAGNOSIS — Z09 Encounter for follow-up examination after completed treatment for conditions other than malignant neoplasm: Secondary | ICD-10-CM | POA: Insufficient documentation

## 2016-03-03 DIAGNOSIS — R1084 Generalized abdominal pain: Secondary | ICD-10-CM | POA: Diagnosis not present

## 2016-03-03 DIAGNOSIS — J90 Pleural effusion, not elsewhere classified: Secondary | ICD-10-CM | POA: Insufficient documentation

## 2016-03-03 DIAGNOSIS — I5023 Acute on chronic systolic (congestive) heart failure: Secondary | ICD-10-CM | POA: Diagnosis present

## 2016-03-03 DIAGNOSIS — R05 Cough: Secondary | ICD-10-CM | POA: Diagnosis present

## 2016-03-03 DIAGNOSIS — N186 End stage renal disease: Secondary | ICD-10-CM | POA: Diagnosis present

## 2016-03-03 DIAGNOSIS — J9601 Acute respiratory failure with hypoxia: Secondary | ICD-10-CM | POA: Diagnosis present

## 2016-03-03 DIAGNOSIS — J811 Chronic pulmonary edema: Secondary | ICD-10-CM

## 2016-03-03 DIAGNOSIS — I429 Cardiomyopathy, unspecified: Secondary | ICD-10-CM | POA: Diagnosis not present

## 2016-03-03 DIAGNOSIS — I169 Hypertensive crisis, unspecified: Secondary | ICD-10-CM | POA: Diagnosis not present

## 2016-03-03 DIAGNOSIS — I509 Heart failure, unspecified: Secondary | ICD-10-CM | POA: Diagnosis not present

## 2016-03-03 DIAGNOSIS — Z888 Allergy status to other drugs, medicaments and biological substances status: Secondary | ICD-10-CM | POA: Diagnosis not present

## 2016-03-03 DIAGNOSIS — F17200 Nicotine dependence, unspecified, uncomplicated: Secondary | ICD-10-CM | POA: Diagnosis present

## 2016-03-03 DIAGNOSIS — R042 Hemoptysis: Secondary | ICD-10-CM | POA: Insufficient documentation

## 2016-03-03 DIAGNOSIS — E8779 Other fluid overload: Secondary | ICD-10-CM

## 2016-03-03 DIAGNOSIS — I110 Hypertensive heart disease with heart failure: Secondary | ICD-10-CM | POA: Diagnosis not present

## 2016-03-03 DIAGNOSIS — Z9889 Other specified postprocedural states: Secondary | ICD-10-CM | POA: Insufficient documentation

## 2016-03-03 DIAGNOSIS — J810 Acute pulmonary edema: Secondary | ICD-10-CM | POA: Diagnosis not present

## 2016-03-03 DIAGNOSIS — Z9114 Patient's other noncompliance with medication regimen: Secondary | ICD-10-CM

## 2016-03-03 LAB — CBC
HEMATOCRIT: 39 % (ref 36.0–46.0)
HEMOGLOBIN: 11.7 g/dL — AB (ref 12.0–15.0)
MCH: 27.1 pg (ref 26.0–34.0)
MCHC: 30 g/dL (ref 30.0–36.0)
MCV: 90.5 fL (ref 78.0–100.0)
Platelets: 137 10*3/uL — ABNORMAL LOW (ref 150–400)
RBC: 4.31 MIL/uL (ref 3.87–5.11)
RDW: 21.1 % — ABNORMAL HIGH (ref 11.5–15.5)
WBC: 4.6 10*3/uL (ref 4.0–10.5)

## 2016-03-03 LAB — BASIC METABOLIC PANEL
ANION GAP: 15 (ref 5–15)
BUN: 73 mg/dL — ABNORMAL HIGH (ref 6–20)
CHLORIDE: 104 mmol/L (ref 101–111)
CO2: 19 mmol/L — ABNORMAL LOW (ref 22–32)
Calcium: 8.6 mg/dL — ABNORMAL LOW (ref 8.9–10.3)
Creatinine, Ser: 10.48 mg/dL — ABNORMAL HIGH (ref 0.44–1.00)
GFR calc Af Amer: 5 mL/min — ABNORMAL LOW (ref >60)
GFR, EST NON AFRICAN AMERICAN: 4 mL/min — AB (ref >60)
GLUCOSE: 85 mg/dL (ref 65–99)
POTASSIUM: 4.7 mmol/L (ref 3.5–5.1)
Sodium: 138 mmol/L (ref 135–145)

## 2016-03-03 LAB — TROPONIN I: Troponin I: 0.06 ng/mL — ABNORMAL HIGH (ref <0.031)

## 2016-03-03 MED ORDER — ACETAMINOPHEN 325 MG PO TABS
ORAL_TABLET | ORAL | Status: AC
  Filled 2016-03-03: qty 2

## 2016-03-03 MED ORDER — HYDROXYCHLOROQUINE SULFATE 200 MG PO TABS
400.0000 mg | ORAL_TABLET | Freq: Every day | ORAL | Status: DC
  Administered 2016-03-03 – 2016-03-09 (×7): 400 mg via ORAL
  Filled 2016-03-03 (×7): qty 2

## 2016-03-03 MED ORDER — ALBUTEROL SULFATE HFA 108 (90 BASE) MCG/ACT IN AERS: 1.0000 | INHALATION_SPRAY | Freq: Four times a day (QID) | RESPIRATORY_TRACT | Status: DC | PRN

## 2016-03-03 MED ORDER — ACETAMINOPHEN 325 MG PO TABS
650.0000 mg | ORAL_TABLET | Freq: Once | ORAL | Status: AC
  Administered 2016-03-03: 650 mg via ORAL

## 2016-03-03 MED ORDER — SODIUM CHLORIDE 0.9 % IV SOLN
62.5000 mg | INTRAVENOUS | Status: DC
  Administered 2016-03-05: 62.5 mg via INTRAVENOUS
  Filled 2016-03-03 (×2): qty 5

## 2016-03-03 MED ORDER — CALCITRIOL 0.5 MCG PO CAPS
1.5000 ug | ORAL_CAPSULE | ORAL | Status: DC
  Administered 2016-03-03 – 2016-03-07 (×3): 1.5 ug via ORAL

## 2016-03-03 MED ORDER — RENA-VITE PO TABS
1.0000 | ORAL_TABLET | Freq: Every day | ORAL | Status: DC
  Administered 2016-03-04 – 2016-03-09 (×6): 1 via ORAL
  Filled 2016-03-03 (×6): qty 1

## 2016-03-03 MED ORDER — LORAZEPAM 2 MG/ML IJ SOLN
1.0000 mg | Freq: Once | INTRAMUSCULAR | Status: AC
  Administered 2016-03-03: 1 mg via INTRAVENOUS
  Filled 2016-03-03: qty 1

## 2016-03-03 MED ORDER — IPRATROPIUM-ALBUTEROL 0.5-2.5 (3) MG/3ML IN SOLN
3.0000 mL | Freq: Once | RESPIRATORY_TRACT | Status: AC
  Administered 2016-03-03: 3 mL via RESPIRATORY_TRACT
  Filled 2016-03-03: qty 3

## 2016-03-03 MED ORDER — HEPARIN SODIUM (PORCINE) 5000 UNIT/ML IJ SOLN
5000.0000 [IU] | Freq: Three times a day (TID) | INTRAMUSCULAR | Status: DC
  Administered 2016-03-03 – 2016-03-08 (×6): 5000 [IU] via SUBCUTANEOUS
  Filled 2016-03-03 (×8): qty 1

## 2016-03-03 MED ORDER — CALCITRIOL 0.25 MCG PO CAPS
ORAL_CAPSULE | ORAL | Status: AC
  Filled 2016-03-03: qty 6

## 2016-03-03 MED ORDER — NICARDIPINE HCL IN NACL 20-0.86 MG/200ML-% IV SOLN
3.0000 mg/h | Freq: Once | INTRAVENOUS | Status: AC
  Administered 2016-03-03: 3 mg/h via INTRAVENOUS
  Filled 2016-03-03: qty 200

## 2016-03-03 MED ORDER — ALBUTEROL SULFATE (2.5 MG/3ML) 0.083% IN NEBU
5.0000 mg | INHALATION_SOLUTION | Freq: Once | RESPIRATORY_TRACT | Status: AC
  Administered 2016-03-03: 5 mg via RESPIRATORY_TRACT
  Filled 2016-03-03: qty 6

## 2016-03-03 NOTE — ED Provider Notes (Signed)
CSN: [id number]     Arrival date & time 03/03/16  0908 History   First MD Initiated Contact with Patient 03/03/16 1031     Chief Complaint  Patient presents with  . Shortness of Breath     (Consider location/radiation/quality/duration/timing/severity/associated sxs/prior Treatment) HPI Patient presents to the emergency department with shortness of breath is been increasing over the last day.  The patient states that she is due for dialysis today was too short of breath.  The patient states that she has not had any chest pain, nausea, vomiting, abdominal pain, back pain, fever, weakness, dizziness, headache, blurred vision, rash, near syncope or syncope.  The patient states that she does feel like she has some fluid overload states that her legs do have some swelling. Past Medical History  Diagnosis Date  . Lupus (systemic lupus erythematosus) (HCC)     Previously followed with Dr. Charlestine Night, has not followed up recently  . 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 S/P pericardiocentesis 01/17/2013    H/o pericardial effusion with tamponade 2006   . H/O pleural effusion 01/17/2013  . H/O pericarditis 01/17/2013  . Polysubstance abuse     cocaine, MJ, tobacco  . Benign hypertension with ESRD (end-stage renal disease) ([place]) 03/15/2014   Past Surgical History  Procedure Laterality Date  . Bascilic vein transposition Left 02/05/2014    Procedure: [place];  Surgeon: [name], MD;  Location: [place];  Service: Vascular;  Laterality: Left;  Annell Greening Right 01/31/2014    Procedure: DIALYSIS CATHETER;  Surgeon: Serafina Mitchell, MD;  Location: [facility] CATH LAB;  Service: Cardiovascular;  Laterality: Right;   History reviewed. No pertinent family history. Social History  Substance Use Topics  . Smoking status: Current Some Day Smoker --  0.25 packs/day for 15 years  . Smokeless tobacco: Never Used     Comment: quit smoking s/p hospital discharge/ SMOKES ABOUT 2-3 CIGARETTES A DAY  . Alcohol Use: Yes     Comment: occ   OB History    No data available     Review of Systems  All other systems negative except as documented in the HPI. All pertinent positives and negatives as reviewed in the HPI.  Allergies  Tobramycin sulfate  Home Medications   Prior to Admission medications   Medication Sig Start Date End Date Taking? Authorizing Provider  albuterol (PROVENTIL HFA;VENTOLIN HFA) 108 (90 Base) MCG/ACT inhaler Inhale 1-2 puffs into the lungs every 6 (six) hours as needed for wheezing or shortness of breath. 02/03/16  Yes Lucious Groves, DO  amLODipine (NORVASC) 5 MG tablet Take 5mg  daily on non dialysis days 06/24/15  Yes Lucious Groves, DO  calcium acetate (PHOSLO) 667 MG capsule Take 1,334-2,001 mg by mouth 3 (three) times daily with meals. Takes 2 with snacks and 3 with meals   Yes Historical Provider, MD  cinacalcet (SENSIPAR) 30 MG tablet Take 30 mg by mouth daily.   Yes Historical Provider, MD  darbepoetin (ARANESP) 60 MCG/0.3ML SOLN injection Inject 0.3 mLs (60 mcg total) into the vein every Wednesday with hemodialysis. Patient taking differently: Inject 60 mcg into the vein every 7 (seven) days.  02/08/14  Yes Jessee Avers, MD  doxycycline (ADOXA) 100 MG tablet Take 100 mg by mouth 2 (two) times daily.   Yes Historical Provider, MD  hydroxychloroquine (PLAQUENIL) 200 MG tablet  Take 2 tablets (400 mg total) by mouth daily. 06/24/15  Yes Lucious Groves, DO  multivitamin (RENA-VIT) TABS tablet Take 1 tablet by mouth daily.   Yes Historical Provider, MD  Nutritional Supplements (FEEDING SUPPLEMENT, NEPRO CARB STEADY,) LIQD Take 237 mLs by mouth See admin instructions. At dialysis   Yes Historical Provider, MD  temazepam (RESTORIL) 15 MG capsule Take 15 mg by mouth at bedtime. 02/28/16  Yes Historical Provider, MD   triamcinolone cream (KENALOG) 0.1 % Apply 1 application topically 2 (two) times daily. 06/24/15  Yes Lucious Groves, DO   BP 173/126 mmHg  Pulse 109  Temp(Src) 97.5 F (36.4 C) (Oral)  Resp 34  SpO2 93%  LMP 10/27/2015 Physical Exam  Constitutional: She is oriented to person, place, and time. She appears well-developed and well-nourished. No distress.  HENT:  Head: Normocephalic and atraumatic.  Mouth/Throat: Oropharynx is clear and moist.  Eyes: Pupils are equal, round, and reactive to light.  Neck: Normal range of motion. Neck supple.  Cardiovascular: Normal rate, regular rhythm and normal heart sounds.  Exam reveals no gallop and no friction rub.   No murmur heard. Pulmonary/Chest: Tachypnea noted. She is in respiratory distress. She has no decreased breath sounds. She has no wheezes. She has no rhonchi. She has rales.  Abdominal: Soft. Bowel sounds are normal. She exhibits no distension. There is no tenderness.  Neurological: She is alert and oriented to person, place, and time. She exhibits normal muscle tone. Coordination normal.  Skin: Skin is warm and dry. No rash noted. No erythema.  Psychiatric: She has a normal mood and affect. Her behavior is normal.  Nursing note and vitals reviewed.   ED Course  Procedures (including critical care time) Labs Review Labs Reviewed  BASIC METABOLIC PANEL - Abnormal; Notable for the following:    CO2 19 (*)    BUN 73 (*)    Creatinine, Ser 10.48 (*)    Calcium 8.6 (*)    GFR calc non Af Amer 4 (*)    GFR calc Af Amer 5 (*)    All other components within normal limits  TROPONIN I - Abnormal; Notable for the following:    Troponin I 0.06 (*)    All other components within normal limits  CBC - Abnormal; Notable for the following:    Hemoglobin 11.7 (*)    RDW 21.1 (*)    Platelets 137 (*)    All other components within normal limits    Imaging Review Dg Chest 2 View  03/03/2016  CLINICAL DATA:  Shortness of breath for 2 days,  dialysis EXAM: CHEST  2 VIEW COMPARISON:  02/17/2016 FINDINGS: Cardiomegaly again noted. Again noted bilateral central vascular congestion and interstitial prominence bilaterally consistent with worsening pulmonary edema. Stable bilateral pleural effusion with bilateral basilar atelectasis or infiltrate. IMPRESSION: Again noted bilateral central vascular congestion and interstitial prominence bilaterally consistent with worsening pulmonary edema. Stable bilateral pleural effusion with bilateral basilar atelectasis or infiltrate. Electronically Signed   By: Lahoma Crocker M.D.   On: 03/03/2016 10:18   I have personally reviewed and evaluated these images and lab results as part of my medical decision-making.   EKG Interpretation   Date/Time:  Tuesday March 03 2016 09:19:19 EST Ventricular Rate:  113 PR Interval:  138 QRS Duration: 74 QT Interval:  338 QTC Calculation: 463 R Axis:   113 Text Interpretation:  Sinus tachycardia Biatrial enlargement Left  posterior fascicular block Nonspecific ST abnormality Abnormal ECG since  last tracing no significant change Confirmed by [facility]  MD, ELLIOTT [phone])  on 03/03/2016 12:26:46 PM       CRITICAL CARE Performed by: Brent General Total critical care time: 50 minutes Critical care time was exclusive of separately billable procedures and treating other patients. Critical care was necessary to treat or prevent imminent or life-threatening deterioration. Critical care was time spent personally by me on the following activities: development of treatment plan with patient and/or surrogate as well as nursing, discussions with consultants, evaluation of patient's response to treatment, examination of patient, obtaining history from patient or surrogate, ordering and performing treatments and interventions, ordering and review of laboratory studies, ordering and review of radiographic studies, pulse oximetry and re-evaluation of patient's  condition.   Patient was placed on BiPAP due to increasing shortness of breath and tachypnea.  The patient will be admitted by the hospitalist and also was seen by nephrology.  Patient needs dialysis to help alleviate the fluid overload that is causing her shortness of breath  Dalia Heading, PA-C 03/04/16 1903  Daleen Bo, MD 03/04/16 2350

## 2016-03-03 NOTE — Consult Note (Signed)
Renal Service Consult Note [facility]  [name] 03/03/2016 Roney Jaffe D Requesting Physician: Dr. Eulis Foster  Reason for Consult:  ESRD patient with SOB/ resp distress HPI: The patient is a 35 y.o. year-old AAF with hx SLE, ESRD on HD x 2-3 yrs, severe HTN, hx pericardiocentesis 2006.  Patient presented to ED with SOB since Sunday, two days ago.  No prod cough, fevers, or chest pain.  No n/v/d.  CXR showing bilat vasc congestion w IS edema and stable bilat effusions. Asked to see for dialysis.   Patient breathing hard on Bipap now.  Able to answer simple questions.  No CP. No HA or visual loss.      ROS  denies CP  no joint pain   no HA  no blurry vision  no rash  no diarrhea  no nausea/ vomiting  no dysuria  no difficulty voiding  no change in urine color    Past Medical History  Past Medical History  Diagnosis Date  . Lupus (systemic lupus erythematosus) (HCC)     Previously followed with Dr. Charlestine Night, has not followed up recently  . 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 S/P pericardiocentesis 01/17/2013    H/o pericardial effusion with tamponade 2006   . H/O pleural effusion 01/17/2013  . H/O pericarditis 01/17/2013  . Polysubstance abuse     cocaine, MJ, tobacco  . Benign hypertension with ESRD (end-stage renal disease) ([place]) 03/15/2014   Past Surgical History  Past Surgical History  Procedure Laterality Date  . Bascilic vein transposition Left 02/05/2014    Procedure: [place];  Surgeon: [name], MD;  Location: [place];  Service: Vascular;  Laterality: Left;  Annell Greening Right 01/31/2014    Procedure: DIALYSIS CATHETER;  Surgeon: Serafina Mitchell, MD;  Location: [facility] CATH LAB;  Service: Cardiovascular;  Laterality: Right;   Family History History reviewed. No pertinent family  history. Social History  reports that she has been smoking.  She has never used smokeless tobacco. She reports that she drinks alcohol. She reports that she uses illicit drugs (Marijuana and Cocaine) about 5 times per week. Allergies  Allergies  Allergen Reactions  . Tobramycin Sulfate Swelling   Home medications Prior to Admission medications   Medication Sig Start Date End Date Taking? Authorizing Provider  albuterol (PROVENTIL HFA;VENTOLIN HFA) 108 (90 Base) MCG/ACT inhaler Inhale 1-2 puffs into the lungs every 6 (six) hours as needed for wheezing or shortness of breath. 02/03/16  Yes Lucious Groves, DO  amLODipine (NORVASC) 5 MG tablet Take 5mg  daily on non dialysis days 06/24/15  Yes Lucious Groves, DO  calcium acetate (PHOSLO) 667 MG capsule Take 1,334-2,001 mg by mouth 3 (three) times daily with meals. Takes 2 with snacks and 3 with meals   Yes Historical Provider, MD  cinacalcet (SENSIPAR) 30 MG tablet Take 30 mg by mouth daily.   Yes Historical Provider, MD  darbepoetin (ARANESP) 60 MCG/0.3ML SOLN injection Inject 0.3 mLs (60 mcg total) into the vein every Wednesday with hemodialysis. Patient taking differently: Inject 60 mcg into the vein every 7 (seven) days.  02/08/14  Yes Jessee Avers, MD  doxycycline (ADOXA) 100 MG tablet Take 100 mg by mouth 2 (two) times daily.   Yes Historical Provider, MD  hydroxychloroquine (PLAQUENIL) 200 MG tablet Take 2 tablets (400 mg total) by mouth daily.  06/24/15  Yes Lucious Groves, DO  multivitamin (RENA-VIT) TABS tablet Take 1 tablet by mouth daily.   Yes Historical Provider, MD  Nutritional Supplements (FEEDING SUPPLEMENT, NEPRO CARB STEADY,) LIQD Take 237 mLs by mouth See admin instructions. At dialysis   Yes Historical Provider, MD  temazepam (RESTORIL) 15 MG capsule Take 15 mg by mouth at bedtime. 02/28/16  Yes Historical Provider, MD  triamcinolone cream (KENALOG) 0.1 % Apply 1 application topically 2 (two) times daily. 06/24/15  Yes Lucious Groves,  DO   Liver Function Tests No results for input(s): AST, ALT, ALKPHOS, BILITOT, PROT, ALBUMIN in the last 168 hours. No results for input(s): LIPASE, AMYLASE in the last 168 hours. CBC  Recent Labs Lab 03/03/16 0940  WBC 4.6  HGB 11.7*  HCT 39.0  MCV 90.5  PLT 0000000*   Basic Metabolic Panel  Recent Labs Lab 03/03/16 0940  NA 138  K 4.7  CL 104  CO2 19*  GLUCOSE 85  BUN 73*  CREATININE 10.48*  CALCIUM 8.6*    Filed Vitals:   03/03/16 1400 03/03/16 1415 03/03/16 1430 03/03/16 1456  BP: 174/124 219/154 234/214   Pulse: 107  64 35  Temp:      TempSrc:      Resp: 34 43 49 33  SpO2: 100%  90% 90%   Exam Drowsy, arousable , on bipap, RRR 40/min No rash, cyanosis or gangrene Sclera anicteric, throat clear +JVD Chest diffuse coarse rales / rhonchi bilat RRR tachy possible gallop, no obvious murmur Abd no ascites, nontender no mass or hsm GU defer MS no joint effusion/ deform Ext no LE or UE edema LUA AVF +bruit/ thrill Neuro lethargic    Dialysis: [facility]  3h 42min  49kg  2/2.5 bath  LUA AVF  Hep none 9.9/ 33%/ 739 > darbe 200 ug/wk next 3/7, venofer 50/wk 8.8/ 5.9/ 56 pth (down from 637) > sensipar 30, phoslo 6 ac, calcitriol 1.5ug BP's high pre and post, small wt gains and gets to dry wt; leaves early between 2h- 3h 51min   Assessment: 1 Acute resp failure/ pulm edema- from HTN'sive crisis and/or vol overload, don't see gross extra vol on exam.  BP's very high.  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       Kelly Splinter MD [employer] pager [phone]    cell [phone] 03/03/2016, 3:02 PM

## 2016-03-03 NOTE — ED Notes (Signed)
Pt states started with SOB Sunday. Pt with Tues, Thurs, SAt dialysis. Last dialysis treatment Sat. Was told by dialysis center to come to ED for worsening SOb. Pt reports increased home inhaler used, appears to have labored breathing with exertion, crackles in bases.

## 2016-03-03 NOTE — H&P (Signed)
Date: 03/03/2016               Patient Name:  [name] MRN: [id number]  DOB: 27-Jun-1981 Age / Sex: 35 y.o., female   PCP: Lucious Groves, DO         Medical Service: Internal Medicine Teaching Service         Attending Physician: Dr. Truman Hayward, MD    First Contact: Dr. Benjamine Mola Pager: [phone]  Second Contact: Dr. 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 Symptoms started on Sunday and progressively worsened until her presentation today. She was previously feeling well and tolerated hemodialysis on Saturday without incident. She reports not taking many of her home medicines as well as eating and drinking more than is recommended or normal for her over the weekend. She denies a productive cough or chest pain. She is not feeling fevers, chills, nausea has no vomiting or diarrhea.  On arrival to emergency department she is in significant respiratory distress partially improved on BiPAP. Systolic blood pressure as high as 230s and placed on nicardipine drip. Chest x-ray was obtained showing extensive bilateral traits and effusions. Besides hypervolemia metabolic profile is as expected for ESRD patient. Nephrology was consulted for emergent hemodialysis and in her medicine consult is for admission to the hospital for her respiratory failure.  Of note history partially limited to chart review as patient was in some respiratory distress on BiPAP only able to answer yes no  Meds: Current Facility-Administered Medications  Medication Dose Route Frequency Provider Last Rate  Last Dose  . albuterol (PROVENTIL HFA;VENTOLIN HFA) 108 (90 Base) MCG/ACT inhaler 1-2 puff  1-2 puff Inhalation Q6H PRN Norman Herrlich, MD      . calcitRIOL (ROCALTROL) capsule 1.5 mcg  1.5 mcg Oral Q T,Th,Sa-HD Alric Seton, PA-C      . Derrill Memo ON 03/05/2016] ferric gluconate (NULECIT) 62.5 mg in sodium chloride 0.9 % 100 mL IVPB  62.5 mg Intravenous Q Thu-HD Alric Seton, PA-C      . heparin injection 5,000 Units  5,000 Units Subcutaneous 3 times per day Norman Herrlich, MD      . hydroxychloroquine (PLAQUENIL) tablet 400 mg  400 mg Oral Daily Norman Herrlich, MD      . multivitamin (RENA-VIT) tablet 1 tablet  1 tablet Oral Daily Norman Herrlich, MD       Current Outpatient Prescriptions  Medication Sig Dispense Refill  . albuterol (PROVENTIL HFA;VENTOLIN HFA) 108 (90 Base) MCG/ACT inhaler Inhale 1-2 puffs into the lungs every 6 (six) hours as needed for wheezing or shortness of breath. 6.7 g 0  . amLODipine (NORVASC) 5 MG tablet Take 5mg  daily on non dialysis days 45 tablet 1  . calcium acetate (PHOSLO) 667 MG capsule Take 1,334-2,001 mg by mouth 3 (three) times daily with meals. Takes 2 with snacks and 3 with meals    . cinacalcet (SENSIPAR) 30 MG tablet Take 30 mg by mouth daily.    . darbepoetin (ARANESP) 60 MCG/0.3ML SOLN injection Inject 0.3 mLs (60 mcg total) into the vein every Wednesday with hemodialysis. (Patient taking differently: Inject  60 mcg into the vein every 7 (seven) days. ) 4.2 mL 0  . doxycycline (ADOXA) 100 MG tablet Take 100 mg by mouth 2 (two) times daily.    . hydroxychloroquine (PLAQUENIL) 200 MG tablet Take 2 tablets (400 mg total) by mouth daily. 60 tablet 2  . multivitamin (RENA-VIT) TABS tablet Take 1 tablet by mouth daily.    . Nutritional Supplements (FEEDING SUPPLEMENT, NEPRO CARB STEADY,) LIQD Take 237 mLs by mouth See admin instructions. At dialysis    . temazepam (RESTORIL) 15 MG capsule Take 15 mg by mouth at bedtime.  0  . triamcinolone cream (KENALOG) 0.1 %  Apply 1 application topically 2 (two) times daily. 30 g 0    Allergies: Allergies as of 03/03/2016 - Review Complete 03/03/2016  Allergen Reaction Noted  . Tobramycin sulfate Swelling 07/05/2012   Past Medical History  Diagnosis Date  . Lupus (systemic lupus erythematosus) (HCC)     Previously followed with Dr. Charlestine Night, has not followed up recently  . 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 S/P pericardiocentesis 01/17/2013    H/o pericardial effusion with tamponade 2006   . H/O pleural effusion 01/17/2013  . H/O pericarditis 01/17/2013  . Polysubstance abuse     cocaine, MJ, tobacco  . Benign hypertension with ESRD (end-stage renal disease) ([place]) 03/15/2014   Past Surgical History  Procedure Laterality Date  . Bascilic vein transposition Left 02/05/2014    Procedure: [place];  Surgeon: [name], MD;  Location: [place];  Service: Vascular;  Laterality: Left;  Annell Greening Right 01/31/2014    Procedure: DIALYSIS CATHETER;  Surgeon: Serafina Mitchell, MD;  Location: [facility] CATH LAB;  Service: Cardiovascular;  Laterality: Right;   History reviewed. No pertinent family history. Social History   Social History  . Marital Status: Single    Spouse Name: N/A  . Number of Children: N/A  . Years of Education: N/A   Occupational History  . Not on file.   Social History Main Topics  . Smoking status: Current Some Day Smoker -- 0.25 packs/day for 15 years  . Smokeless tobacco: Never Used     Comment: quit smoking s/p hospital discharge/ SMOKES ABOUT 2-3 CIGARETTES A DAY  . Alcohol Use: Yes     Comment: occ  . Drug Use: 5.00 per week    Special: Marijuana, Cocaine     Comment: last used thanksgiving, per pt 03/03/16  . Sexual Activity: Yes   Other Topics Concern  . Not on file   Social History Narrative    Review of  Systems: ROS patient having significant work of breathing on BiPAP able to answer most questions yes or no. Does report insidious worsening of her symptoms, some swelling, denies chest pain, denies nausea/vomiting, states no substance abuse since last year  Physical Exam: Blood pressure 150/102, pulse 106, temperature 97.9 F (36.6 C), temperature source Axillary, resp. rate 34, last menstrual period 10/27/2015, SpO2 100 %.   Exam limited by patient status- on bipap with high work of breathing  GENERAL- drowsy, co-operative, mild respiratory distress HEENT- On Bipap, no cervical LAD CARDIAC- Tachycardic, no murmurs, rubs or gallops. RESP- Diffuse loud crackles throughout long fields, diminished air movement in LLL ABDOMEN- Soft, nontender, normoactive bowel sounds present NEURO- Strength upper and lower extremities- 5/5, Sensation intact globally EXTREMITIES- symmetric, no pedal edema, LUE AVF  with palpable thrill SKIN- Warm, dry, No rash or lesion. PSYCH- Somewhat somnolent, in distress   Lab results: Basic Metabolic Panel:  Recent Labs  03/03/16 0940  NA 138  K 4.7  CL 104  CO2 19*  GLUCOSE 85  BUN 73*  CREATININE 10.48*  CALCIUM 8.6*   Liver Function Tests: No results for input(s): AST, ALT, ALKPHOS, BILITOT, PROT, ALBUMIN in the last 72 hours. No results for input(s): LIPASE, AMYLASE in the last 72 hours. No results for input(s): AMMONIA in the last 72 hours. CBC:  Recent Labs  03/03/16 0940  WBC 4.6  HGB 11.7*  HCT 39.0  MCV 90.5  PLT 137*   Cardiac Enzymes:  Recent Labs  03/03/16 0940  TROPONINI 0.06*   BNP: No results for input(s): PROBNP in the last 72 hours. D-Dimer: No results for input(s): DDIMER in the last 72 hours. CBG: No results for input(s): GLUCAP in the last 72 hours. Hemoglobin A1C: No results for input(s): HGBA1C in the last 72 hours. Fasting Lipid Panel: No results for input(s): CHOL, HDL, LDLCALC, TRIG, CHOLHDL, LDLDIRECT in the  last 72 hours. Thyroid Function Tests: No results for input(s): TSH, T4TOTAL, FREET4, T3FREE, THYROIDAB in the last 72 hours. Anemia Panel: No results for input(s): VITAMINB12, FOLATE, FERRITIN, TIBC, IRON, RETICCTPCT in the last 72 hours. Coagulation: No results for input(s): LABPROT, INR in the last 72 hours. Urine Drug Screen: Drugs of Abuse     Component Value Date/Time   LABOPIA NONE DETECTED 01/17/2013 1058   COCAINSCRNUR POSITIVE* 01/17/2013 1058   LABBENZ NONE DETECTED 01/17/2013 1058   AMPHETMU NONE DETECTED 01/17/2013 1058   THCU POSITIVE* 01/17/2013 1058   LABBARB NONE DETECTED 01/17/2013 1058    Alcohol Level: No results for input(s): ETH in the last 72 hours. Urinalysis: No results for input(s): COLORURINE, LABSPEC, PHURINE, GLUCOSEU, HGBUR, BILIRUBINUR, KETONESUR, PROTEINUR, UROBILINOGEN, NITRITE, LEUKOCYTESUR in the last 72 hours.  Invalid input(s): APPERANCEUR  Imaging results:  Dg Chest 2 View  03/03/2016  CLINICAL DATA:  Shortness of breath for 2 days, dialysis EXAM: CHEST  2 VIEW COMPARISON:  02/17/2016 FINDINGS: Cardiomegaly again noted. Again noted bilateral central vascular congestion and interstitial prominence bilaterally consistent with worsening pulmonary edema. Stable bilateral pleural effusion with bilateral basilar atelectasis or infiltrate. IMPRESSION: Again noted bilateral central vascular congestion and interstitial prominence bilaterally consistent with worsening pulmonary edema. Stable bilateral pleural effusion with bilateral basilar atelectasis or infiltrate. Electronically Signed   By: Lahoma Crocker M.D.   On: 03/03/2016 10:18    Other results: EKG: sinus tachycardia with suggested biatrial dilation, replorization abnormality but not progressive across leads, normal QTc  Assessment & Plan by Problem: Acute hypoxic respiratory failure 2/2 pulmonary edema: Diffuse infiltrates and bilateral effusions on CXR. Patient appears hypervolemic despite  tolerating last HD session fully 3 days ago. Likely edema more from hypertensive crisis that brought on by hypervolemia. Probably little benefit to be had with steroids and aggressive breathing treatments. Patient still progressing towards adequate control on cardene drip prior to HD. Fluid removal should improve status dramatically. -Admit to telemetry on continuous monitoring -Continue to support with NIPPV if tolerating versus consult PCCM if respiratory or mental status decline -Albuterol PRN  Hypertensive emergency: BP 230s/120s in the ED. Likely cause of her severe pulmonary edema. Unclear if brought on by dietary indescretion, noncompliance with home meds, or relapsed cocaine abuse despite reported period of abstinence -Cardene infusion -hold home amlodpine for now -UDS  ESRD on HD: Nephrology consulted for  emergent HD for volume removal. L brachiobasilic AVF. Pulmonary congestion severe although not edematous on exam. -Nephrology consulted, recs appreciated  SLE: Cause of her kidney disease. On plaquenil, appears stable/little disease activity. Lupus nephritis has already progressed to ESRD. -Plaquenil 400mg   Chronic substance abuse: Unclear if active user, had reported last episode being in November. If not cocaine exposure could be a precipitating factor for her hypertensive crisis. - UDS  Diet: Renal VTE ppx: Heparin FULL CODE  Dispo: Disposition is deferred at this time, awaiting improvement of current medical problems. Anticipated discharge in approximately 2-3 day(s).   The patient does have a current PCP Lucious Groves, DO) and does need an [facility] hospital follow-up appointment after discharge.  The patient does not know have transportation limitations that hinder transportation to clinic appointments.  Signed: Collier Salina, MD 03/03/2016, 4:59 PM

## 2016-03-03 NOTE — Progress Notes (Signed)
   03/03/16 2010  BiPAP/CPAP/SIPAP  BiPAP/CPAP/SIPAP Pt Type Adult  Mask Type Full face mask  Mask Size Medium  Set Rate 10 breaths/min  Respiratory Rate 35 breaths/min  IPAP 10 cmH20  EPAP 5 cmH2O  Oxygen Percent 40 %  Flow Rate 0 lpm  Minute Ventilation 14.4  Leak 21  Peak Inspiratory Pressure (PIP) 11  Tidal Volume (Vt) 443  BiPAP/CPAP/SIPAP BiPAP  Patient Home Equipment No  Auto Titrate No  BiPAP/CPAP /SiPAP Vitals  Temp 99.5 F (37.5 C)  Pulse Rate (!) 103  Resp (!) 27  BP (!) 150/99 mmHg  SpO2 98 %  Patient transported to [place] from Hemodialysis  On a BIPAP without any complication.

## 2016-03-03 NOTE — ED Notes (Signed)
RT at bedside.,

## 2016-03-03 NOTE — Procedures (Signed)
  I was present at this dialysis session, have reviewed the session itself and made  appropriate changes Kelly Splinter MD [place] pager [phone]    cell [phone] 03/03/2016, 4:57 PM

## 2016-03-03 NOTE — Plan of Care (Signed)
Problem: Phase I Progression Outcomes Goal: Initial discharge plan identified Outcome: Progressing Patient remove from [place] currently on 1L oxygen at 94%.

## 2016-03-03 NOTE — ED Provider Notes (Signed)
  Face-to-face evaluation   History: She complains of shortness of breath, and fatigue, for 3 days. She last dialyzed, 3 days ago. She has cough which is nonproductive. She denies fever, chills, or vomiting.  Physical exam: Alert, uncomfortable. She is tachypneic. Lungs have scattered rhonchi. There are no wheezes . Generalized rales.  Medical screening examination/treatment/procedure(s) were conducted as a shared visit with non-physician practitioner(s) and myself.  I personally evaluated the patient during the encounter  Daleen Bo, MD 03/04/16 2350

## 2016-03-03 NOTE — ED Notes (Signed)
Per Lawyer PA- Pt ok to take home amlodipine.

## 2016-03-03 NOTE — ED Notes (Signed)
EKG completed in triage.

## 2016-03-04 ENCOUNTER — Inpatient Hospital Stay (HOSPITAL_COMMUNITY): Payer: Medicaid Other

## 2016-03-04 DIAGNOSIS — M3214 Glomerular disease in systemic lupus erythematosus: Secondary | ICD-10-CM

## 2016-03-04 DIAGNOSIS — N186 End stage renal disease: Secondary | ICD-10-CM

## 2016-03-04 DIAGNOSIS — J810 Acute pulmonary edema: Secondary | ICD-10-CM

## 2016-03-04 DIAGNOSIS — T405X1A Poisoning by cocaine, accidental (unintentional), initial encounter: Secondary | ICD-10-CM | POA: Insufficient documentation

## 2016-03-04 DIAGNOSIS — R042 Hemoptysis: Secondary | ICD-10-CM | POA: Insufficient documentation

## 2016-03-04 DIAGNOSIS — F14288 Cocaine dependence with other cocaine-induced disorder: Secondary | ICD-10-CM

## 2016-03-04 DIAGNOSIS — J811 Chronic pulmonary edema: Secondary | ICD-10-CM | POA: Insufficient documentation

## 2016-03-04 DIAGNOSIS — M791 Myalgia, unspecified site: Secondary | ICD-10-CM | POA: Insufficient documentation

## 2016-03-04 DIAGNOSIS — Z992 Dependence on renal dialysis: Secondary | ICD-10-CM

## 2016-03-04 DIAGNOSIS — I169 Hypertensive crisis, unspecified: Secondary | ICD-10-CM

## 2016-03-04 LAB — RAPID URINE DRUG SCREEN, HOSP PERFORMED
AMPHETAMINES: NOT DETECTED
BARBITURATES: NOT DETECTED
Benzodiazepines: NOT DETECTED
Cocaine: POSITIVE — AB
Opiates: NOT DETECTED
TETRAHYDROCANNABINOL: NOT DETECTED

## 2016-03-04 LAB — RENAL FUNCTION PANEL
ALBUMIN: 2.1 g/dL — AB (ref 3.5–5.0)
Anion gap: 12 (ref 5–15)
BUN: 37 mg/dL — AB (ref 6–20)
CALCIUM: 7.9 mg/dL — AB (ref 8.9–10.3)
CO2: 27 mmol/L (ref 22–32)
CREATININE: 6.68 mg/dL — AB (ref 0.44–1.00)
Chloride: 100 mmol/L — ABNORMAL LOW (ref 101–111)
GFR calc Af Amer: 9 mL/min — ABNORMAL LOW (ref >60)
GFR, EST NON AFRICAN AMERICAN: 7 mL/min — AB (ref >60)
GLUCOSE: 98 mg/dL (ref 65–99)
PHOSPHORUS: 4.4 mg/dL (ref 2.5–4.6)
Potassium: 4 mmol/L (ref 3.5–5.1)
SODIUM: 139 mmol/L (ref 135–145)

## 2016-03-04 LAB — INFLUENZA PANEL BY PCR (TYPE A & B)
H1N1 flu by pcr: NOT DETECTED
INFLAPCR: NEGATIVE
Influenza B By PCR: NEGATIVE

## 2016-03-04 LAB — MRSA PCR SCREENING: MRSA BY PCR: NEGATIVE

## 2016-03-04 LAB — HIV ANTIBODY (ROUTINE TESTING W REFLEX): HIV Screen 4th Generation wRfx: NONREACTIVE

## 2016-03-04 MED ORDER — AMLODIPINE BESYLATE 10 MG PO TABS
10.0000 mg | ORAL_TABLET | Freq: Every day | ORAL | Status: DC
  Administered 2016-03-04 – 2016-03-08 (×5): 10 mg via ORAL
  Filled 2016-03-04 (×5): qty 1

## 2016-03-04 MED ORDER — ACETAMINOPHEN 325 MG PO TABS
650.0000 mg | ORAL_TABLET | Freq: Four times a day (QID) | ORAL | Status: DC | PRN
  Administered 2016-03-05 (×2): 650 mg via ORAL
  Filled 2016-03-04 (×2): qty 2

## 2016-03-04 MED ORDER — GI COCKTAIL ~~LOC~~
30.0000 mL | Freq: Two times a day (BID) | ORAL | Status: DC | PRN
  Administered 2016-03-04 – 2016-03-05 (×2): 30 mL via ORAL
  Filled 2016-03-04 (×2): qty 30

## 2016-03-04 NOTE — Progress Notes (Signed)
Subjective: Patient breathing comfortably on nasal cannula 1-2L since last night after HD. O2 sat 95-100% during encounter. Has intermittent cough. Does not feel any chest pain. Family present at bedside.  Objective: Vital signs in last 24 hours: Filed Vitals:   03/04/16 0400 03/04/16 0500 03/04/16 0600 03/04/16 0621  BP:  148/96  138/94  Pulse: 131 114 118 118  Temp:      TempSrc:      Resp: 40 29 37 32  Height:      Weight:      SpO2: 98% 91% 91% 93%   Weight change:   Intake/Output Summary (Last 24 hours) at 03/04/16 0816 Last data filed at 03/04/16 0339  Gross per 24 hour  Intake 610.17 ml  Output   2000 ml  Net -1389.83 ml   GENERAL- Thin appearing woman, alert, co-operative, NAD HEENT- Atraumatic, PERRL, EOMI, oral mucosa appears moist CARDIAC- RRR, no murmurs, rubs or gallops. RESP- Good air movement, mild inspiratory crackles b/l, some upper airway secretions NEURO- No obvious Cr N abnormality, strength upper and lower extremities- 5/5 EXTREMITIES- symmetric, no pedal edema, LUE AVF and RUE antecubital fossa IV site with dressings in place SKIN- Warm, dry, No rash or lesion. PSYCH- Normal mood and affect, appropriate thought content and speech.   Lab Results: Basic Metabolic Panel:  Recent Labs Lab 03/03/16 0940 03/04/16 0253  NA 138 139  K 4.7 4.0  CL 104 100*  CO2 19* 27  GLUCOSE 85 98  BUN 73* 37*  CREATININE 10.48* 6.68*  CALCIUM 8.6* 7.9*  PHOS  --  4.4   Liver Function Tests:  Recent Labs Lab 03/04/16 0253  ALBUMIN 2.1*   No results for input(s): LIPASE, AMYLASE in the last 168 hours. No results for input(s): AMMONIA in the last 168 hours. CBC:  Recent Labs Lab 03/03/16 0940  WBC 4.6  HGB 11.7*  HCT 39.0  MCV 90.5  PLT 137*   Cardiac Enzymes:  Recent Labs Lab 03/03/16 0940  TROPONINI 0.06*   BNP: No results for input(s): PROBNP in the last 168 hours. D-Dimer: No results for input(s): DDIMER in the last 168  hours. CBG: No results for input(s): GLUCAP in the last 168 hours. Hemoglobin A1C: No results for input(s): HGBA1C in the last 168 hours. Fasting Lipid Panel: No results for input(s): CHOL, HDL, LDLCALC, TRIG, CHOLHDL, LDLDIRECT in the last 168 hours. Thyroid Function Tests: No results for input(s): TSH, T4TOTAL, FREET4, T3FREE, THYROIDAB in the last 168 hours. Coagulation: No results for input(s): LABPROT, INR in the last 168 hours. Anemia Panel: No results for input(s): VITAMINB12, FOLATE, FERRITIN, TIBC, IRON, RETICCTPCT in the last 168 hours. Urine Drug Screen: Drugs of Abuse     Component Value Date/Time   LABOPIA NONE DETECTED 03/04/2016 0359   COCAINSCRNUR POSITIVE* 03/04/2016 0359   LABBENZ NONE DETECTED 03/04/2016 0359   AMPHETMU NONE DETECTED 03/04/2016 0359   THCU NONE DETECTED 03/04/2016 0359   LABBARB NONE DETECTED 03/04/2016 0359    Alcohol Level: No results for input(s): ETH in the last 168 hours. Urinalysis: No results for input(s): COLORURINE, LABSPEC, PHURINE, GLUCOSEU, HGBUR, BILIRUBINUR, KETONESUR, PROTEINUR, UROBILINOGEN, NITRITE, LEUKOCYTESUR in the last 168 hours.  Invalid input(s): APPERANCEUR   Micro Results: Recent Results (from the past 240 hour(s))  MRSA PCR Screening     Status: None   Collection Time: 03/04/16 12:37 AM  Result Value Ref Range Status   MRSA by PCR NEGATIVE NEGATIVE Final    Comment:  The GeneXpert MRSA Assay (FDA approved for NASAL specimens only), is one component of a comprehensive MRSA colonization surveillance program. It is not intended to diagnose MRSA infection nor to guide or monitor treatment for MRSA infections.    Studies/Results: Dg Chest 2 View  03/03/2016  CLINICAL DATA:  Shortness of breath for 2 days, dialysis EXAM: CHEST  2 VIEW COMPARISON:  02/17/2016 FINDINGS: Cardiomegaly again noted. Again noted bilateral central vascular congestion and interstitial prominence bilaterally consistent with  worsening pulmonary edema. Stable bilateral pleural effusion with bilateral basilar atelectasis or infiltrate. IMPRESSION: Again noted bilateral central vascular congestion and interstitial prominence bilaterally consistent with worsening pulmonary edema. Stable bilateral pleural effusion with bilateral basilar atelectasis or infiltrate. Electronically Signed   By: Lahoma Crocker M.D.   On: 03/03/2016 10:18   Medications: I have reviewed the patient's current medications. Scheduled Meds: . calcitRIOL  1.5 mcg Oral Q T,Th,Sa-HD  . [START ON 03/05/2016] ferric gluconate (FERRLECIT/NULECIT) IV  62.5 mg Intravenous Q Thu-HD  . heparin  5,000 Units Subcutaneous 3 times per day  . hydroxychloroquine  400 mg Oral Daily  . multivitamin  1 tablet Oral Daily   Continuous Infusions:  PRN Meds:.albuterol Assessment/Plan: Acute hypoxic respiratory failure 2/2 pulmonary edema: Much improved, tolerating room air at rest. Probably related to HTN since her total hypervolemia did not seem so severe outside of respiratory. Physical exam much improved, maybe repeat CXR in her follow up appt. -Ambulate patient with SpO2 monitoring -Room air vs nasal cannula PRN -Albuterol PRN  Hypertensive emergency: BP 230s/120s in the ED. She is on just amlodipine 5mg  PTA so cocaine use most likely explanation for this after normal condition at HD on Saturday. -Resume home amlodipine 5mg   ESRD on HD: Underwent HD last night with volume removal, greatly improved respiratory status down to 1L nasal cannula without distress. Patient is normally on TTS regimen dialyze her again or not will evaluate/per nephro. -Nephrology consulted, recs appreciated  SLE: Stable -Plaquenil 400mg   Chronic substance abuse: UDS positive for cocaine. Patient denies recent use, states she smoked marijuana Friday night that may have had something in it. UDS negative for THC. Overall story does not fit well with clinical course or the test results. Will  continue under presumption that cocaine use may be involved in precipitating this hypertensive crisis.  Diet: Renal VTE ppx: Heparin FULL CODE  Dispo: Disposition is deferred at this time, awaiting improvement of current medical problems. Anticipated discharge in approximately 0-1 day(s).   The patient does have a current PCP Lucious Groves, DO) and does need an [facility] hospital follow-up appointment after discharge.  The patient does not know have transportation limitations that hinder transportation to clinic appointments.    LOS: 1 day   Collier Salina, MD 03/04/2016, 8:16 AM

## 2016-03-04 NOTE — Progress Notes (Signed)
New Admission Note:  Transfer from [place]: Wheel chair Mental Orientation: A/O x4 Telemetry: placed Assessment: Completed Skin: clean dry, intact IV: RAC SL Pain: none Tubes: n/a Safety Measures: Safety Fall Prevention Plan has been given, discussed and signed Admission: Completed Unit Orientation: Patient has been orientated to the room, unit and staff.  Family: none present  Orders have been reviewed and implemented. Will continue to monitor the patient. Call light has been placed within reach and bed alarm has been activated.   Retta Mac BSN, RN

## 2016-03-04 NOTE — Progress Notes (Signed)
  [facility] Progress Note   Subjective: got 2 L off w HD last night. Feels a lot better, weaned off bipap, still some wet coughing. No SOB, on RA, no O2.   Filed Vitals:   03/04/16 0600 03/04/16 0621 03/04/16 0800 03/04/16 0827  BP:  138/94 144/105 141/112  Pulse: 118 118    Temp:   98.7 F (37.1 C)   TempSrc:   Axillary   Resp: 37 32 23   Height:      Weight:      SpO2: 91% 93%  90%    Inpatient medications: . amLODipine  10 mg Oral Daily  . calcitRIOL  1.5 mcg Oral Q T,Th,Sa-HD  . [START ON 03/05/2016] ferric gluconate (FERRLECIT/NULECIT) IV  62.5 mg Intravenous Q Thu-HD  . heparin  5,000 Units Subcutaneous 3 times per day  . hydroxychloroquine  400 mg Oral Daily  . multivitamin  1 tablet Oral Daily     albuterol  Exam: Alert, no O2, RR 20 No jvd Chest mostly clear, occ crackels bases RRR tachy no MRG Abd no ascites, nontender no mass or hsm Ext no LE or UE edema LUA AVF +bruit/ thrill Neuro allert ox 3    Dialysis: [facility] 3h 38min 49kg 2/2.5 bath LUA AVF Hep none 9.9/ 33%/ 739 > darbe 200 ug/wk next 3/7, venofer 50/wk 8.8/ 5.9/ 38 pth (down from 637) > sensipar 30, phoslo 6 ac, calcitriol 1.5ug BP's high pre and post, small wt gains and gets to dry wt; leaves early between 2h- 3h 59min   Assessment: 1 Acute resp failure/ pulm edema- drug screen +cocaine which explains severe HTN crisis and flash pulm edema. Not a volume issue. Better today.  2 HTN'sive crisis - due to drug abuse. Takes norvasc only at home.  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 F/U CXR today. Norvasc.    Kelly Splinter MD [facility] pager [phone]    cell [phone] 03/04/2016, 10:56 AM    Recent Labs Lab 03/03/16 0940 03/04/16 0253  NA 138 139  K 4.7 4.0  CL 104 100*  CO2 19* 27  GLUCOSE 85 98  BUN 73* 37*  CREATININE 10.48*  6.68*  CALCIUM 8.6* 7.9*  PHOS  --  4.4    Recent Labs Lab 03/04/16 0253  ALBUMIN 2.1*    Recent Labs Lab 03/03/16 0940  WBC 4.6  HGB 11.7*  HCT 39.0  MCV 90.5  PLT 137*

## 2016-03-05 ENCOUNTER — Inpatient Hospital Stay (HOSPITAL_COMMUNITY): Payer: Medicaid Other

## 2016-03-05 DIAGNOSIS — J90 Pleural effusion, not elsewhere classified: Secondary | ICD-10-CM | POA: Insufficient documentation

## 2016-03-05 DIAGNOSIS — R109 Unspecified abdominal pain: Secondary | ICD-10-CM | POA: Insufficient documentation

## 2016-03-05 LAB — CBC
HCT: 22.7 % — ABNORMAL LOW (ref 36.0–46.0)
HCT: 23.8 % — ABNORMAL LOW (ref 36.0–46.0)
HCT: 24.2 % — ABNORMAL LOW (ref 36.0–46.0)
Hemoglobin: 7.1 g/dL — ABNORMAL LOW (ref 12.0–15.0)
Hemoglobin: 7.4 g/dL — ABNORMAL LOW (ref 12.0–15.0)
Hemoglobin: 7.6 g/dL — ABNORMAL LOW (ref 12.0–15.0)
MCH: 27.7 pg (ref 26.0–34.0)
MCH: 27.5 pg (ref 26.0–34.0)
MCH: 27.9 pg (ref 26.0–34.0)
MCHC: 31.3 g/dL (ref 30.0–36.0)
MCHC: 31.1 g/dL (ref 30.0–36.0)
MCHC: 31.4 g/dL (ref 30.0–36.0)
MCV: 88.7 fL (ref 78.0–100.0)
MCV: 88.5 fL (ref 78.0–100.0)
MCV: 89 fL (ref 78.0–100.0)
Platelets: 82 10*3/uL — ABNORMAL LOW (ref 150–400)
Platelets: 90 10*3/uL — ABNORMAL LOW (ref 150–400)
Platelets: 102 10*3/uL — ABNORMAL LOW (ref 150–400)
RBC: 2.56 MIL/uL — ABNORMAL LOW (ref 3.87–5.11)
RBC: 2.69 MIL/uL — ABNORMAL LOW (ref 3.87–5.11)
RBC: 2.72 MIL/uL — ABNORMAL LOW (ref 3.87–5.11)
RDW: 20.5 % — ABNORMAL HIGH (ref 11.5–15.5)
RDW: 20.5 % — ABNORMAL HIGH (ref 11.5–15.5)
RDW: 20.4 % — AB (ref 11.5–15.5)
WBC: 7.7 10*3/uL (ref 4.0–10.5)
WBC: 7.7 10*3/uL (ref 4.0–10.5)
WBC: 7.5 10*3/uL (ref 4.0–10.5)

## 2016-03-05 LAB — RENAL FUNCTION PANEL
Albumin: 2.1 g/dL — ABNORMAL LOW (ref 3.5–5.0)
Anion gap: 15 (ref 5–15)
BUN: 64 mg/dL — ABNORMAL HIGH (ref 6–20)
CO2: 24 mmol/L (ref 22–32)
Calcium: 8.3 mg/dL — ABNORMAL LOW (ref 8.9–10.3)
Chloride: 99 mmol/L — ABNORMAL LOW (ref 101–111)
Creatinine, Ser: 8.87 mg/dL — ABNORMAL HIGH (ref 0.44–1.00)
GFR calc Af Amer: 6 mL/min — ABNORMAL LOW (ref >60)
GFR calc non Af Amer: 5 mL/min — ABNORMAL LOW (ref >60)
Glucose, Bld: 71 mg/dL (ref 65–99)
Phosphorus: 7.4 mg/dL — ABNORMAL HIGH (ref 2.5–4.6)
Potassium: 5.1 mmol/L (ref 3.5–5.1)
Sodium: 138 mmol/L (ref 135–145)

## 2016-03-05 MED ORDER — SODIUM CHLORIDE 0.9 % IV SOLN: 100.0000 mL | INTRAVENOUS | Status: DC | PRN

## 2016-03-05 MED ORDER — DM-GUAIFENESIN ER 30-600 MG PO TB12
1.0000 | ORAL_TABLET | Freq: Two times a day (BID) | ORAL | Status: DC
Start: 2016-03-05 — End: 2016-03-09
  Administered 2016-03-05 – 2016-03-09 (×10): 1 via ORAL
  Filled 2016-03-05 (×10): qty 1

## 2016-03-05 MED ORDER — PENTAFLUOROPROP-TETRAFLUOROETH EX AERO: 1.0000 "application " | INHALATION_SPRAY | CUTANEOUS | Status: DC | PRN

## 2016-03-05 MED ORDER — IOHEXOL 300 MG/ML  SOLN
25.0000 mL | INTRAMUSCULAR | Status: AC
  Administered 2016-03-05 (×2): 25 mL via ORAL

## 2016-03-05 MED ORDER — GUAIFENESIN ER 600 MG PO TB12: 600.0000 mg | ORAL_TABLET | Freq: Two times a day (BID) | ORAL | Status: DC

## 2016-03-05 MED ORDER — ALTEPLASE 2 MG IJ SOLR
2.0000 mg | Freq: Once | INTRAMUSCULAR | Status: DC | PRN
  Filled 2016-03-05: qty 2

## 2016-03-05 MED ORDER — HEPARIN SODIUM (PORCINE) 1000 UNIT/ML DIALYSIS: 1000.0000 [IU] | INTRAMUSCULAR | Status: DC | PRN

## 2016-03-05 MED ORDER — LIDOCAINE HCL (PF) 1 % IJ SOLN: 5.0000 mL | INTRAMUSCULAR | Status: DC | PRN

## 2016-03-05 MED ORDER — ALTEPLASE 2 MG IJ SOLR: 2.0000 mg | Freq: Once | INTRAMUSCULAR | Status: DC | PRN

## 2016-03-05 MED ORDER — NEPRO/CARBSTEADY PO LIQD
237.0000 mL | Freq: Two times a day (BID) | ORAL | Status: DC
  Administered 2016-03-05 – 2016-03-06 (×2): 237 mL via ORAL

## 2016-03-05 MED ORDER — LIDOCAINE-PRILOCAINE 2.5-2.5 % EX CREA: 1.0000 "application " | TOPICAL_CREAM | CUTANEOUS | Status: DC | PRN

## 2016-03-05 MED ORDER — CALCIUM ACETATE (PHOS BINDER) 667 MG PO CAPS
2001.0000 mg | ORAL_CAPSULE | Freq: Three times a day (TID) | ORAL | Status: DC
  Administered 2016-03-05 – 2016-03-09 (×10): 2001 mg via ORAL
  Filled 2016-03-05 (×10): qty 3

## 2016-03-05 MED ORDER — CINACALCET HCL 30 MG PO TABS
30.0000 mg | ORAL_TABLET | Freq: Every day | ORAL | Status: DC
  Administered 2016-03-05 – 2016-03-08 (×4): 30 mg via ORAL
  Filled 2016-03-05 (×4): qty 1

## 2016-03-05 MED ORDER — HEPARIN SODIUM (PORCINE) 1000 UNIT/ML DIALYSIS: 2000.0000 [IU] | Freq: Once | INTRAMUSCULAR | Status: DC

## 2016-03-05 MED ORDER — LIDOCAINE-PRILOCAINE 2.5-2.5 % EX CREA
1.0000 "application " | TOPICAL_CREAM | CUTANEOUS | Status: DC | PRN
  Filled 2016-03-05: qty 5

## 2016-03-05 MED ORDER — CALCITRIOL 0.5 MCG PO CAPS
ORAL_CAPSULE | ORAL | Status: AC
  Filled 2016-03-05: qty 3

## 2016-03-05 NOTE — Progress Notes (Signed)
Hemodialysis: Attending ordered a H&H stat, verified with Alric Seton, PA andd stated that we cannot draw H&H from the machine. Pa will check on it and addressed appropriately.

## 2016-03-05 NOTE — Progress Notes (Signed)
MD on call notified that pt is having abdominal pain and GI cocktail was given as ordered pt states that this is not effective in relieving her pain. MD also informed that patient is eating starch and she has this in her room ans she states she just eats 2 teaspoon daily. Bowel sounds are present and abdomen is taut she is passing gas last BM charted 3/8 will continue to monitor. Arthor Captain LPN

## 2016-03-05 NOTE — Progress Notes (Signed)
Subjective: Congestion and mild SOB without associated worsening oxygen desaturation. Also had some abdominal pain without nausea or radiation. Continues to have a wet cough and receiving supplemental oxygen. Seen on hemodialysis this morning.  Objective: Vital signs in last 24 hours: Filed Vitals:   03/04/16 2120 03/05/16 0000 03/05/16 0100 03/05/16 0451  BP: 127/97  133/100 135/93  Pulse: 103  97 99  Temp: 98.6 F (37 C)  98 F (36.7 C) 97.7 F (36.5 C)  TempSrc: Oral  Oral Oral  Resp: 18   19  Height:      Weight: 51.1 kg (112 lb 10.5 oz)     SpO2: 100% 99% 100% 96%   Weight change: -0.2 kg (-7.1 oz)  Intake/Output Summary (Last 24 hours) at 03/05/16 0714 Last data filed at 03/05/16 0630  Gross per 24 hour  Intake    480 ml  Output      0 ml  Net    480 ml   GENERAL- Thin appearing woman, drowsy, co-operative, NAD HEENT- Atraumatic, PERRL, EOMI, oral mucosa appears moist CARDIAC- Tachycardic, regular, no murmurs, rubs or gallops. RESP- No crackles or wheezes, decreased breath sounds over left lung base ABDOMEN- Diffuse TTP and guarding, no obvious peritoneal signs, normoactive bowel sounds present EXTREMITIES- symmetric, no pedal edema, LUE AVF in use SKIN- Warm, dry, No rash or lesion. PSYCH- Normal mood and affect, appropriate thought content and speech.  Lab Results: Basic Metabolic Panel:  Recent Labs Lab 03/03/16 0940 03/04/16 0253  NA 138 139  K 4.7 4.0  CL 104 100*  CO2 19* 27  GLUCOSE 85 98  BUN 73* 37*  CREATININE 10.48* 6.68*  CALCIUM 8.6* 7.9*  PHOS  --  4.4   Liver Function Tests:  Recent Labs Lab 03/04/16 0253  ALBUMIN 2.1*   No results for input(s): LIPASE, AMYLASE in the last 168 hours. No results for input(s): AMMONIA in the last 168 hours. CBC:  Recent Labs Lab 03/03/16 0940  WBC 4.6  HGB 11.7*  HCT 39.0  MCV 90.5  PLT 137*   Cardiac Enzymes:  Recent Labs Lab 03/03/16 0940  TROPONINI 0.06*   BNP: No results for  input(s): PROBNP in the last 168 hours. D-Dimer: No results for input(s): DDIMER in the last 168 hours. CBG: No results for input(s): GLUCAP in the last 168 hours. Hemoglobin A1C: No results for input(s): HGBA1C in the last 168 hours. Fasting Lipid Panel: No results for input(s): CHOL, HDL, LDLCALC, TRIG, CHOLHDL, LDLDIRECT in the last 168 hours. Thyroid Function Tests: No results for input(s): TSH, T4TOTAL, FREET4, T3FREE, THYROIDAB in the last 168 hours. Coagulation: No results for input(s): LABPROT, INR in the last 168 hours. Anemia Panel: No results for input(s): VITAMINB12, FOLATE, FERRITIN, TIBC, IRON, RETICCTPCT in the last 168 hours. Urine Drug Screen: Drugs of Abuse     Component Value Date/Time   LABOPIA NONE DETECTED 03/04/2016 0359   COCAINSCRNUR POSITIVE* 03/04/2016 0359   LABBENZ NONE DETECTED 03/04/2016 0359   AMPHETMU NONE DETECTED 03/04/2016 0359   THCU NONE DETECTED 03/04/2016 0359   LABBARB NONE DETECTED 03/04/2016 0359    Alcohol Level: No results for input(s): ETH in the last 168 hours. Urinalysis: No results for input(s): COLORURINE, LABSPEC, PHURINE, GLUCOSEU, HGBUR, BILIRUBINUR, KETONESUR, PROTEINUR, UROBILINOGEN, NITRITE, LEUKOCYTESUR in the last 168 hours.  Invalid input(s): APPERANCEUR   Micro Results: Recent Results (from the past 240 hour(s))  MRSA PCR Screening     Status: None   Collection Time: 03/04/16  12:37 AM  Result Value Ref Range Status   MRSA by PCR NEGATIVE NEGATIVE Final    Comment:        The GeneXpert MRSA Assay (FDA approved for NASAL specimens only), is one component of a comprehensive MRSA colonization surveillance program. It is not intended to diagnose MRSA infection nor to guide or monitor treatment for MRSA infections.    Studies/Results: Dg Chest 2 View  03/04/2016  CLINICAL DATA:  Pulmonary edema. EXAM: CHEST  2 VIEW COMPARISON:  March 03, 2016. FINDINGS: Stable cardiomegaly. No pneumothorax is noted. Bony thorax  is unremarkable. Moderate left pleural effusion is noted which is increased in size compared to prior exam, with probable underlying edema or atelectasis. Continued right basilar opacity is noted concerning for mild effusion with associated edema. IMPRESSION: Significantly increased left pleural effusion with probable underlying edema or atelectasis. Continued right basilar opacity is noted concerning for mild effusion with associated edema. Electronically Signed   By: Marijo Conception, M.D.   On: 03/04/2016 12:39   Dg Chest 2 View  03/03/2016  CLINICAL DATA:  Shortness of breath for 2 days, dialysis EXAM: CHEST  2 VIEW COMPARISON:  02/17/2016 FINDINGS: Cardiomegaly again noted. Again noted bilateral central vascular congestion and interstitial prominence bilaterally consistent with worsening pulmonary edema. Stable bilateral pleural effusion with bilateral basilar atelectasis or infiltrate. IMPRESSION: Again noted bilateral central vascular congestion and interstitial prominence bilaterally consistent with worsening pulmonary edema. Stable bilateral pleural effusion with bilateral basilar atelectasis or infiltrate. Electronically Signed   By: Lahoma Crocker M.D.   On: 03/03/2016 10:18   Medications: I have reviewed the patient's current medications. Scheduled Meds: . amLODipine  10 mg Oral Daily  . calcitRIOL  1.5 mcg Oral Q T,Th,Sa-HD  . dextromethorphan-guaiFENesin  1 tablet Oral BID  . ferric gluconate (FERRLECIT/NULECIT) IV  62.5 mg Intravenous Q Thu-HD  . [START ON 03/06/2016] heparin  2,000 Units Dialysis Once in dialysis  . heparin  5,000 Units Subcutaneous 3 times per day  . hydroxychloroquine  400 mg Oral Daily  . multivitamin  1 tablet Oral Daily   Continuous Infusions:  PRN Meds:.sodium chloride, sodium chloride, acetaminophen, albuterol, alteplase, gi cocktail, heparin, lidocaine (PF), lidocaine-prilocaine, pentafluoroprop-tetrafluoroeth Assessment/Plan: Acute hypoxic respiratory failure 2/2  pulmonary edema: Much improved, tolerating room air. Pulm edema and oxygenation improved on exam and monitor. Looks like from HTN not hypervolemia. However repeat CXR showing increased left pleural effusion. Also has continued wet cough. Tmax 99.9, not leukocytosis. Does not appear likely pneumonia but will check effusion and if appropriate obtain fluid for analysis. -O2 sats goal 95% -Will assess for diagnostic and therapeutic thoracentesis -Continue inpatient observation and management  Hypertensive emergency: BP 110s-140s. Asociated with cocaine +. Back to home med amlodipine will d/c on 10mg  dose. -amlodipine 10mg   ESRD on HD: Planned HD today AM. Tolerating HD well. AOCD, MBD managed per nephrology -Nephrology consulted, recs appreciated  Abdominal pain: Not clear cause, abdomen is diffusely tender to palpation without obvious peritoneal signs. Tylenol and GI cocktail die not offer much improvement overnight. -KUB  SLE: Stable -Plaquenil 400mg   Diet: Renal VTE ppx: Heparin FULL CODE  Dispo: Pending further improvement of clinical course, likely discharge to home in the next 1-2 days.  The patient does have a current PCP Lucious Groves, DO) and does need an [facility] hospital follow-up appointment after discharge.  The patient does not know have transportation limitations that hinder transportation to clinic appointments.    LOS: 2 days  Collier Salina, MD 03/05/2016, 7:14 AM

## 2016-03-05 NOTE — Progress Notes (Signed)
Md on call notified that pt had a c/o Sob O2 stats 99% on 2L pt does not appear to be in distress call speak with no SOB lungs sounds are congested and pt has a cough. New Orders received and IS was given and pt was instructed to use often Q2hrs while awake.Vitals charted in EPIC. Arthor Captain LPN

## 2016-03-05 NOTE — Progress Notes (Signed)
[facility] Progress Note  Dialysis: [facility] 3h 66min 49kg 2/2.5 bath LUA AVF Hep none 9.9/ 33%/ 739 > darbe 200 ug/wk next 3/7, venofer 50/wk 8.8/ 5.9/ 72 pth (down from 637) > sensipar 30, phoslo 6 ac, calcitriol 1.5ug BP's high pre and post, small wt gains and gets to dry wt; leaves early between 2h- 3h 51min  Assessment/Plan: 1 Acute resp failure/ pulm edema- drug screen +cocaine which explains severe HTN crisis and flash pulm edema. Not a volume issue. Improved UF 2 L 3/7 CXR 3/8;right pleural effusion, possible underlying edema - goal today 3.5 - 1.1 off so far. BP stable. She has low grade temp, productive cough and CXR is worse despite fluid removal. Consdier other possibilities for abnormal CXR/ cough. Have d/w primary team.  2 HTN'sive crisis - due to drug abuse. Takes norvasc only at home.  3 ESRD HD TTS 4 Cocaine addiction 5 Hx pericarditis/ tamponade 2006 6 Anemia Hgb 11.7 - down to 7.1 today - ???? 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 8 SLE 9. Hx noncompliance with dialysis meds 10. Protein malnutrition alb 2.1- add nepro  Myriam Jacobson, PA-C [place] [phone] 03/05/2016,9:20 AM  LOS: 2 days   Pt seen, examined, agree w assess/plan as above with additions as indicated.  Kelly Splinter MD [facility] pager [phone]    cell [phone] 03/05/2016, 11:13 AM    Subjective:   Feels at little better, No N, V, D, SOB. + cough. Eating a little  Objective Filed Vitals:   03/05/16 0451 03/05/16 0800  03/05/16 0805 03/05/16 0830  BP: 135/93 131/98 130/99 134/100  Pulse: 99 94 96 107  Temp: 97.7 F (36.5 C) 98.2 F (36.8 C)    TempSrc: Oral Oral    Resp: 19 25 28    Height:      Weight:  51.1 kg (112 lb 10.5 oz)    SpO2: 96% 100%     Physical Exam General: thin ill appearing AAF wet cough lying on side in HD Heart: tachy reg Lungs: clears with cough, dim right base Abdomen: soft NT Extremities: no edema Dialysis Access:  Left upper AVF  Additional Objective Labs: Basic Metabolic Panel:  Recent Labs Lab 03/03/16 0940 03/04/16 0253 03/05/16 0844  NA 138 139 138  K 4.7 4.0 5.1  CL 104 100* 99*  CO2 19* 27 24  GLUCOSE 85 98 71  BUN 73* 37* 64*  CREATININE 10.48* 6.68* 8.87*  CALCIUM 8.6* 7.9* 8.3*  PHOS  --  4.4 7.4*   Liver Function Tests:  Recent Labs Lab 03/04/16 0253 03/05/16 0844  ALBUMIN 2.1* 2.1*   CBC:  Recent Labs Lab 03/03/16 0940 03/05/16 0844  WBC 4.6 7.7  HGB 11.7* 7.1*  HCT 39.0 22.7*  MCV 90.5 88.7  PLT 137* PENDING   Cardiac Enzymes:  Recent Labs Lab 03/03/16 0940  TROPONINI 0.06*    Studies/Results: Dg Chest 2 View  03/04/2016  CLINICAL DATA:  Pulmonary edema. EXAM: CHEST  2 VIEW COMPARISON:  March 03, 2016. FINDINGS: Stable cardiomegaly. No pneumothorax is noted. Bony thorax is unremarkable. Moderate left pleural effusion is noted which is increased in size compared to prior exam, with probable underlying edema or atelectasis. Continued right basilar opacity is noted concerning for mild effusion with associated edema. IMPRESSION: Significantly increased left pleural effusion with probable underlying edema or atelectasis. Continued right basilar opacity is noted concerning for mild effusion with associated edema. Electronically Signed   By: Marijo Conception, M.D.   On: 03/04/2016 12:39   Dg Chest 2 View  03/03/2016  CLINICAL DATA:  Shortness of breath for 2 days, dialysis EXAM: CHEST  2 VIEW COMPARISON:  02/17/2016 FINDINGS:  Cardiomegaly again noted. Again noted bilateral central vascular congestion and interstitial prominence bilaterally consistent with worsening pulmonary edema. Stable bilateral pleural effusion with bilateral basilar atelectasis or infiltrate. IMPRESSION: Again noted bilateral central vascular congestion and interstitial prominence bilaterally consistent with worsening pulmonary edema. Stable bilateral pleural effusion with bilateral basilar atelectasis or infiltrate. Electronically Signed   By: Lahoma Crocker M.D.   On: 03/03/2016 10:18   Medications:   . amLODipine  10 mg Oral Daily  . calcitRIOL  1.5 mcg Oral Q T,Th,Sa-HD  . dextromethorphan-guaiFENesin  1 tablet Oral BID  . ferric gluconate (FERRLECIT/NULECIT) IV  62.5 mg Intravenous Q Thu-HD  . [START ON 03/06/2016] heparin  2,000 Units Dialysis Once in dialysis  . heparin  5,000 Units Subcutaneous 3 times per day  . hydroxychloroquine  400 mg Oral Daily  . multivitamin  1 tablet Oral Daily

## 2016-03-06 ENCOUNTER — Inpatient Hospital Stay (HOSPITAL_COMMUNITY): Payer: Medicaid Other

## 2016-03-06 ENCOUNTER — Encounter (HOSPITAL_COMMUNITY): Payer: Self-pay | Admitting: Radiology

## 2016-03-06 DIAGNOSIS — Z9889 Other specified postprocedural states: Secondary | ICD-10-CM | POA: Insufficient documentation

## 2016-03-06 DIAGNOSIS — E8779 Other fluid overload: Secondary | ICD-10-CM | POA: Insufficient documentation

## 2016-03-06 DIAGNOSIS — R188 Other ascites: Secondary | ICD-10-CM | POA: Insufficient documentation

## 2016-03-06 DIAGNOSIS — Z09 Encounter for follow-up examination after completed treatment for conditions other than malignant neoplasm: Secondary | ICD-10-CM | POA: Insufficient documentation

## 2016-03-06 LAB — COMPREHENSIVE METABOLIC PANEL
ALT: 22 U/L (ref 14–54)
AST: 30 U/L (ref 15–41)
Albumin: 2.6 g/dL — ABNORMAL LOW (ref 3.5–5.0)
Alkaline Phosphatase: 84 U/L (ref 38–126)
Anion gap: 13 (ref 5–15)
BUN: 11 mg/dL (ref 6–20)
CALCIUM: 8.2 mg/dL — AB (ref 8.9–10.3)
CHLORIDE: 97 mmol/L — AB (ref 101–111)
CO2: 28 mmol/L (ref 22–32)
CREATININE: 2.98 mg/dL — AB (ref 0.44–1.00)
GFR, EST AFRICAN AMERICAN: 23 mL/min — AB (ref >60)
GFR, EST NON AFRICAN AMERICAN: 20 mL/min — AB (ref >60)
Glucose, Bld: 88 mg/dL (ref 65–99)
Potassium: 3.6 mmol/L (ref 3.5–5.1)
Sodium: 138 mmol/L (ref 135–145)
Total Bilirubin: 0.5 mg/dL (ref 0.3–1.2)
Total Protein: 8.3 g/dL — ABNORMAL HIGH (ref 6.5–8.1)

## 2016-03-06 LAB — GLUCOSE, SEROUS FLUID: GLUCOSE FL: 109 mg/dL

## 2016-03-06 LAB — GRAM STAIN: Gram Stain: NONE SEEN

## 2016-03-06 LAB — CBC
HCT: 24.6 % — ABNORMAL LOW (ref 36.0–46.0)
HCT: 22.9 % — ABNORMAL LOW (ref 36.0–46.0)
HEMOGLOBIN: 7.5 g/dL — AB (ref 12.0–15.0)
Hemoglobin: 7 g/dL — ABNORMAL LOW (ref 12.0–15.0)
MCH: 27.6 pg (ref 26.0–34.0)
MCH: 27.2 pg (ref 26.0–34.0)
MCHC: 30.5 g/dL (ref 30.0–36.0)
MCHC: 30.6 g/dL (ref 30.0–36.0)
MCV: 89.1 fL (ref 78.0–100.0)
MCV: 90.2 fL (ref 78.0–100.0)
PLATELETS: 115 10*3/uL — AB (ref 150–400)
Platelets: 101 10*3/uL — ABNORMAL LOW (ref 150–400)
RBC: 2.76 MIL/uL — ABNORMAL LOW (ref 3.87–5.11)
RBC: 2.54 MIL/uL — ABNORMAL LOW (ref 3.87–5.11)
RDW: 20.3 % — ABNORMAL HIGH (ref 11.5–15.5)
RDW: 20.3 % — AB (ref 11.5–15.5)
WBC: 7.1 10*3/uL (ref 4.0–10.5)
WBC: 5.7 10*3/uL (ref 4.0–10.5)

## 2016-03-06 LAB — OCCULT BLOOD X 1 CARD TO LAB, STOOL: Fecal Occult Bld: NEGATIVE

## 2016-03-06 LAB — PROTEIN, BODY FLUID

## 2016-03-06 LAB — LACTATE DEHYDROGENASE: LDH: 251 U/L — AB (ref 98–192)

## 2016-03-06 LAB — LACTATE DEHYDROGENASE, PLEURAL OR PERITONEAL FLUID: LD FL: 64 U/L — AB (ref 3–23)

## 2016-03-06 MED ORDER — LIDOCAINE HCL (PF) 1 % IJ SOLN
INTRAMUSCULAR | Status: AC
  Filled 2016-03-06: qty 10

## 2016-03-06 MED ORDER — IOHEXOL 300 MG/ML  SOLN
100.0000 mL | Freq: Once | INTRAMUSCULAR | Status: AC | PRN
  Administered 2016-03-06: 100 mL via INTRAVENOUS

## 2016-03-06 NOTE — Procedures (Signed)
Ultrasound-guided diagnostic and therapeutic left thoracentesis performed yielding 0.63 liters of serosanguinous colored fluid. No immediate complications. Follow-up chest x-ray pending.       Sanchez Hemmer E 11:30 AM 03/06/2016

## 2016-03-06 NOTE — Progress Notes (Signed)
Pt. refused BiPAP, has not use since 03/04/16.

## 2016-03-06 NOTE — Progress Notes (Signed)
Subjective:  No cos / when do i go home/ Thoracentesis pend this am  admit team RX  Objective Vital signs in last 24 hours: Filed Vitals:   03/05/16 1130 03/05/16 2055 03/06/16 0458 03/06/16 0912  BP: 142/102 120/78 116/75 131/87  Pulse: 96 98 104 100  Temp: 98.2 F (36.8 C) 98.5 F (36.9 C) 98.4 F (36.9 C) 98.7 F (37.1 C)  TempSrc: Oral   Oral  Resp: 21 20 18 18   Height:      Weight: 49.1 kg (108 lb 3.9 oz)     SpO2: 95% 93% 100% 100%   Weight change: 0 kg (0 lb) Physical Exam General: thin young AAF/chronically  ill appearing  Heart: Tachy Regular , no rub or mur / gall. Lungs:BS  dim right base Abdomen: soft NT/ ND Extremities: no  Pedal edema Dialysis Access: Left upper AVF pos bruit    OP Dialysis: [facility] 3h 44min 49kg 2/2.5 bath LUA AVF Hep none 9.9/ 33%/ 739 > darbe 200 ug/wk next 3/7, venofer 50/wk 8.8/ 5.9/ 56 pth (down from 637) > sensipar 30, phoslo 6 ac, calcitriol 1.5ug BP's high pre and post, small wt gains and gets to dry wt; leaves early between 2h- 3h 54min  Problems: 1 Acute resp failure/ pleural effusions/ pulm edema /cough- CT chest shows lower lobe consolidation bilat bases (PNA) and bilat large pleural effusions/ abundant ascites and marked CM. Productive cough, consider Abx for PNA.  Will need serial HD to get volume down and ECHO ordered. Last echo 2014 normal.  .2 HTN'sive crisis - due to drug abuse/ vol overload. Better. On norvasc.  3 ESRD HD TTS 4 Cocaine addiction 5 Hx pericarditis/ tamponade 2006 - no  6 Anemia Hgb 11.7 - 7.1 yest >7.5 > 7.0 today  Reck pre HD - no symptoms of blood loss at this time;  Her last outpatient Hgb was 9.9 3/2; will resume esa at max dose /NEG.  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 Hx noncompliance with dialysis meds 10. Protein malnutrition alb 2.1- add nepro  Plan - serial HD, ECHO, get vol down   Ernest Haber, PA-C [facility]  [phone] 03/06/2016,9:53 AM  LOS: 3 days   Labs: Basic Metabolic Panel:  Recent Labs Lab 03/03/16 0940 03/04/16 0253 03/05/16 0844  NA 138 139 138  K 4.7 4.0 5.1  CL 104 100* 99*  CO2 19* 27 24  GLUCOSE 85 98 71  BUN 73* 37* 64*  CREATININE 10.48* 6.68* 8.87*  CALCIUM 8.6* 7.9* 8.3*  PHOS  --  4.4 7.4*   Liver Function Tests:  Recent Labs Lab 03/04/16 0253 03/05/16 0844  ALBUMIN 2.1* 2.1*   No results for input(s): LIPASE, AMYLASE in the last 168 hours. No results for input(s): AMMONIA in the last 168 hours. CBC:  Recent Labs Lab 03/03/16 0940 03/05/16 0844 03/05/16 1453 03/05/16 2104 03/06/16 0056  WBC 4.6 7.7 7.7 7.5 7.1  HGB 11.7* 7.1* 7.4* 7.6* 7.5*  HCT 39.0 22.7* 23.8* 24.2* 24.6*  MCV 90.5 88.7 88.5 89.0 89.1  PLT 137* 82* 90* 102* 101*   Cardiac Enzymes:  Recent Labs Lab 03/03/16 0940  TROPONINI 0.06*   CBG: No results for input(s): GLUCAP in the last 168 hours. Medications:   . amLODipine  10 mg Oral Daily  . calcitRIOL  1.5 mcg Oral Q T,Th,Sa-HD  . calcium acetate  2,001 mg Oral TID WC  .  cinacalcet  30 mg Oral Q supper  . dextromethorphan-guaiFENesin  1 tablet Oral BID  . feeding supplement (NEPRO CARB STEADY)  237 mL Oral BID BM  . ferric gluconate (FERRLECIT/NULECIT) IV  62.5 mg Intravenous Q Thu-HD  . heparin  5,000 Units Subcutaneous 3 times per day  . hydroxychloroquine  400 mg Oral Daily  . multivitamin  1 tablet Oral Daily

## 2016-03-06 NOTE — Progress Notes (Signed)
Subjective: Underwent thoracentesis his morning and reports some coughing but overall improved breathing afterwards. Continues to complain of abdominal swelling, TTP.  Objective: Vital signs in last 24 hours: Filed Vitals:   03/06/16 1308 03/06/16 1325 03/06/16 1400 03/06/16 1430  BP: 113/85 128/102 121/86 121/90  Pulse: 100 95 94 93  Temp: 98.3 F (36.8 C)     TempSrc: Oral     Resp: 20 18 19 22   Height:      Weight: 48.5 kg (106 lb 14.8 oz)     SpO2: 100%      Weight change: 0 kg (0 lb)  Intake/Output Summary (Last 24 hours) at 03/06/16 1442 Last data filed at 03/06/16 1100  Gross per 24 hour  Intake    360 ml  Output    401 ml  Net    -41 ml   GENERAL- Thin appearing woman, co-operative, NAD HEENT- Atraumatic, PERRL, EOMI, oral mucosa appears moist CARDIAC- RRR, no murmurs, rubs or gallops. RESP- No wheezes, faint crackles on lung bases ABDOMEN- Diffuse TTP and guarding, no obvious peritoneal signs, normoactive bowel sounds present EXTREMITIES- symmetric, no pedal edema, LUE AVF in use SKIN- Warm, dry, No rash or lesion. PSYCH- Normal mood and affect, appropriate thought content and speech.  Lab Results: Basic Metabolic Panel:  Recent Labs Lab 03/04/16 0253 03/05/16 0844  NA 139 138  K 4.0 5.1  CL 100* 99*  CO2 27 24  GLUCOSE 98 71  BUN 37* 64*  CREATININE 6.68* 8.87*  CALCIUM 7.9* 8.3*  PHOS 4.4 7.4*   Liver Function Tests:  Recent Labs Lab 03/04/16 0253 03/05/16 0844  ALBUMIN 2.1* 2.1*   CBC:  Recent Labs Lab 03/06/16 0056 03/06/16 0852  WBC 7.1 5.7  HGB 7.5* 7.0*  HCT 24.6* 22.9*  MCV 89.1 90.2  PLT 101* 115*   Cardiac Enzymes:  Recent Labs Lab 03/03/16 0940  TROPONINI 0.06*   Urine Drug Screen: Drugs of Abuse     Component Value Date/Time   LABOPIA NONE DETECTED 03/04/2016 0359   COCAINSCRNUR POSITIVE* 03/04/2016 0359   LABBENZ NONE DETECTED 03/04/2016 0359   AMPHETMU NONE DETECTED 03/04/2016 0359   THCU NONE DETECTED  03/04/2016 0359   LABBARB NONE DETECTED 03/04/2016 0359    Micro Results: Recent Results (from the past 240 hour(s))  MRSA PCR Screening     Status: None   Collection Time: 03/04/16 12:37 AM  Result Value Ref Range Status   MRSA by PCR NEGATIVE NEGATIVE Final    Comment:        The GeneXpert MRSA Assay (FDA approved for NASAL specimens only), is one component of a comprehensive MRSA colonization surveillance program. It is not intended to diagnose MRSA infection nor to guide or monitor treatment for MRSA infections.   Culture, blood (Routine X 2) w Reflex to ID Panel     Status: None (Preliminary result)   Collection Time: 03/05/16  8:30 AM  Result Value Ref Range Status   Specimen Description BLOOD HEMODIALYSIS FISTULA  Final   Special Requests BOTTLES DRAWN AEROBIC AND ANAEROBIC 10CC  Final   Culture NO GROWTH < 24 HOURS  Final   Report Status PENDING  Incomplete  Culture, blood (Routine X 2) w Reflex to ID Panel     Status: None (Preliminary result)   Collection Time: 03/05/16  9:00 AM  Result Value Ref Range Status   Specimen Description BLOOD HEMODIALYSIS FISTULA  Final   Special Requests BOTTLES DRAWN AEROBIC AND ANAEROBIC 10CC  Final   Culture NO GROWTH < 24 HOURS  Final   Report Status PENDING  Incomplete  Gram stain     Status: None   Collection Time: 03/06/16 10:59 AM  Result Value Ref Range Status   Specimen Description FLUID PLEURAL LEFT  Final   Special Requests NONE  Final   Gram Stain NO WBC SEEN NO ORGANISMS SEEN   Final   Report Status 03/06/2016 FINAL  Final   Studies/Results:  Medications: I have reviewed the patient's current medications. Scheduled Meds: . amLODipine  10 mg Oral Daily  . calcitRIOL  1.5 mcg Oral Q T,Th,Sa-HD  . calcium acetate  2,001 mg Oral TID WC  . cinacalcet  30 mg Oral Q supper  . dextromethorphan-guaiFENesin  1 tablet Oral BID  . feeding supplement (NEPRO CARB STEADY)  237 mL Oral BID BM  . ferric gluconate  (FERRLECIT/NULECIT) IV  62.5 mg Intravenous Q Thu-HD  . heparin  5,000 Units Subcutaneous 3 times per day  . hydroxychloroquine  400 mg Oral Daily  . lidocaine (PF)      . multivitamin  1 tablet Oral Daily   Continuous Infusions:  PRN Meds:.acetaminophen, albuterol, gi cocktail Assessment/Plan: Acute hypoxic respiratory failure 2/2 pulmonary edema: Much improved, tolerating room air. Effusions on repeat CXR improved after thoracentesis of 641mLs. Also has continued wet cough. Tmax 99.9, not leukocytosis. Seems more like HTN crisis with pulm edema and hypervolemia as cause of effusions, but will check effusion fluid analysis and follow closely. -O2 sats goal 95% -F/U thoracentesis fluid analysis  ESRD on HD: Continue HD per nephrology. Agree with plans for serial HD and ECHO. -Nephrology consulted, recs appreciated -MBD, AOCD management per nephrology  Abdominal pain, ascites: New ascites noted on abdominal CT. Volume disproportionate to overall hypervolemia and she states never having this before. Probably can improve with repeated HD but may pursue diagnostic and therapeutic paracentesis. -Paracentesis -Albumin, cell count -Cmet  SLE: Stable. Plaquenil 400mg  Hypertensive emergency: 2/2 cocaine use, improved. Amlodipine 10mg   Diet: Renal VTE ppx: Heparin FULL CODE  Dispo: Pending further improvement of clinical course, likely discharge to home in the next 1-2 days.  The patient does have a current PCP Lucious Groves, DO) and does need an [facility] hospital follow-up appointment after discharge.  The patient does not know have transportation limitations that hinder transportation to clinic appointments.   LOS: 3 days   Collier Salina, MD 03/06/2016, 2:42 PM

## 2016-03-06 NOTE — Progress Notes (Signed)
Called and notified Dr. Posey Pronto regarding patient's c/o tightness on her chest/back which is relieved a little bit with positioning. Patient requesting heating pad if ok. Dr. Posey Pronto said, he will put order/s.

## 2016-03-07 DIAGNOSIS — J90 Pleural effusion, not elsewhere classified: Secondary | ICD-10-CM

## 2016-03-07 DIAGNOSIS — R188 Other ascites: Secondary | ICD-10-CM

## 2016-03-07 DIAGNOSIS — E8779 Other fluid overload: Secondary | ICD-10-CM

## 2016-03-07 DIAGNOSIS — D649 Anemia, unspecified: Secondary | ICD-10-CM

## 2016-03-07 DIAGNOSIS — R1084 Generalized abdominal pain: Secondary | ICD-10-CM

## 2016-03-07 LAB — RENAL FUNCTION PANEL
ALBUMIN: 2.1 g/dL — AB (ref 3.5–5.0)
Anion gap: 13 (ref 5–15)
BUN: 18 mg/dL (ref 6–20)
CALCIUM: 7.7 mg/dL — AB (ref 8.9–10.3)
CO2: 28 mmol/L (ref 22–32)
CREATININE: 4.29 mg/dL — AB (ref 0.44–1.00)
Chloride: 98 mmol/L — ABNORMAL LOW (ref 101–111)
GFR calc Af Amer: 15 mL/min — ABNORMAL LOW (ref >60)
GFR calc non Af Amer: 13 mL/min — ABNORMAL LOW (ref >60)
GLUCOSE: 78 mg/dL (ref 65–99)
PHOSPHORUS: 3.6 mg/dL (ref 2.5–4.6)
Potassium: 3.5 mmol/L (ref 3.5–5.1)
SODIUM: 139 mmol/L (ref 135–145)

## 2016-03-07 LAB — TRIGLYCERIDES, BODY FLUIDS: TRIGLYCERIDES FL: 9 mg/dL

## 2016-03-07 LAB — CBC
HCT: 24.4 % — ABNORMAL LOW (ref 36.0–46.0)
Hemoglobin: 7.4 g/dL — ABNORMAL LOW (ref 12.0–15.0)
MCH: 27.3 pg (ref 26.0–34.0)
MCHC: 30.3 g/dL (ref 30.0–36.0)
MCV: 90 fL (ref 78.0–100.0)
Platelets: 137 10*3/uL — ABNORMAL LOW (ref 150–400)
RBC: 2.71 MIL/uL — ABNORMAL LOW (ref 3.87–5.11)
RDW: 19.8 % — AB (ref 11.5–15.5)
WBC: 4.3 10*3/uL (ref 4.0–10.5)

## 2016-03-07 MED ORDER — SODIUM CHLORIDE 0.9 % IV SOLN: 100.0000 mL | INTRAVENOUS | Status: DC | PRN

## 2016-03-07 MED ORDER — CALCITRIOL 0.5 MCG PO CAPS
ORAL_CAPSULE | ORAL | Status: AC
  Filled 2016-03-07: qty 3

## 2016-03-07 MED ORDER — HYDROMORPHONE HCL 1 MG/ML IJ SOLN
0.5000 mg | INTRAMUSCULAR | Status: DC | PRN
  Administered 2016-03-07: 0.5 mg via INTRAVENOUS

## 2016-03-07 MED ORDER — HEPARIN SODIUM (PORCINE) 1000 UNIT/ML DIALYSIS: 1000.0000 [IU] | INTRAMUSCULAR | Status: DC | PRN

## 2016-03-07 MED ORDER — OXYCODONE HCL 5 MG PO TABS
5.0000 mg | ORAL_TABLET | Freq: Once | ORAL | Status: AC
  Administered 2016-03-07: 5 mg via ORAL
  Filled 2016-03-07 (×2): qty 1

## 2016-03-07 MED ORDER — LIDOCAINE HCL (PF) 1 % IJ SOLN: 5.0000 mL | INTRAMUSCULAR | Status: DC | PRN

## 2016-03-07 MED ORDER — PENTAFLUOROPROP-TETRAFLUOROETH EX AERO: 1.0000 "application " | INHALATION_SPRAY | CUTANEOUS | Status: DC | PRN

## 2016-03-07 MED ORDER — LIDOCAINE-PRILOCAINE 2.5-2.5 % EX CREA: 1.0000 "application " | TOPICAL_CREAM | CUTANEOUS | Status: DC | PRN

## 2016-03-07 MED ORDER — HYDROMORPHONE HCL 1 MG/ML IJ SOLN
INTRAMUSCULAR | Status: AC
  Filled 2016-03-07: qty 1

## 2016-03-07 MED ORDER — ALTEPLASE 2 MG IJ SOLR
2.0000 mg | Freq: Once | INTRAMUSCULAR | Status: DC | PRN
Start: 2016-03-07 — End: 2016-03-07
  Filled 2016-03-07: qty 2

## 2016-03-07 NOTE — Progress Notes (Signed)
CRITICAL VALUE ALERT  Critical value received: + culture ( Pleural fluid)  Date of notification:  03/07/2016   Time of notification: 0930    Critical value read back:y  Nurse who received alert:  Clint Biello  MD notified (1st page):  Rice  Time of first page:  0930  MD notified (2nd page):  Time of second page:  Responding MD:  rice  Time MD responded:  [phone]

## 2016-03-07 NOTE — Progress Notes (Signed)
Subjective:  No cos / when do i go home/ Thoracentesis pend this am  admit team RX  Objective Vital signs in last 24 hours: Filed Vitals:   03/07/16 0705 03/07/16 0730 03/07/16 0800 03/07/16 0830  BP: 125/106 133/92 120/82 123/80  Pulse: 100 98 97 107  Temp:      TempSrc:      Resp:      Height:      Weight:      SpO2:       Weight change: -2.6 kg (-5 lb 11.7 oz) Physical Exam General: thin young AAF/chronically  ill appearing  Heart: RRR no MRG Lungs:BS  dim bilat bases Abdomen: soft NT/ ND Extremities: no pedal edema Dialysis Access: Left upper AVF pos bruit    OP Dialysis: [facility] 3h 43min 49kg 2/2.5 bath LUA AVF Hep none 9.9/ 33%/ 739 > darbe 200 ug/wk next 3/7, venofer 50/wk 8.8/ 5.9/ 56 pth (down from 637) > sensipar 30, phoslo 6 ac, calcitriol 1.5ug BP's high pre and post, small wt gains and gets to dry wt; leaves early between 2h- 3h 31min  Problems: 1 Acute resp failure- resolved 2 Volume excess/ pleural effusions/ ascites - max UF w HD, lean body wt loss, below dry wt but still vol overloaded 3 HTN'sive crisis - resolved 3 ESRD HD TTS 4 Cocaine abuse 5 Anemia Hgb 7's, no active bleed and abd CT negative. Follow. 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 Hx noncompliance with dialysis meds 10. Protein malnutrition alb 2.1- added nepro  Plan - get f/u CXR in am  Kelly Splinter MD [place] pager [phone]    cell [phone] 03/07/2016, 8:55 AM    Labs: Basic Metabolic Panel:  Recent Labs Lab 03/04/16 0253 03/05/16 0844 03/06/16 1817  NA 139 138 138  K 4.0 5.1 3.6  CL 100* 99* 97*  CO2 27 24 28   GLUCOSE 98 71 88  BUN 37* 64* 11  CREATININE 6.68* 8.87* 2.98*  CALCIUM 7.9* 8.3* 8.2*  PHOS 4.4 7.4*  --    Liver Function Tests:  Recent Labs Lab 03/04/16 0253 03/05/16 0844 03/06/16 1817  AST  --   --  30  ALT  --   --  22  ALKPHOS  --   --  84  BILITOT  --   --  0.5  PROT  --    --  8.3*  ALBUMIN 2.1* 2.1* 2.6*   No results for input(s): LIPASE, AMYLASE in the last 168 hours. No results for input(s): AMMONIA in the last 168 hours. CBC:  Recent Labs Lab 03/05/16 1453 03/05/16 2104 03/06/16 0056 03/06/16 0852 03/07/16 0715  WBC 7.7 7.5 7.1 5.7 4.3  HGB 7.4* 7.6* 7.5* 7.0* 7.4*  HCT 23.8* 24.2* 24.6* 22.9* 24.4*  MCV 88.5 89.0 89.1 90.2 90.0  PLT 90* 102* 101* 115* 137*   Cardiac Enzymes:  Recent Labs Lab 03/03/16 0940  TROPONINI 0.06*   CBG: No results for input(s): GLUCAP in the last 168 hours. Medications:   . amLODipine  10 mg Oral Daily  . calcitRIOL  1.5 mcg Oral Q T,Th,Sa-HD  . calcium acetate  2,001 mg Oral TID WC  . cinacalcet  30 mg Oral Q supper  . dextromethorphan-guaiFENesin  1 tablet Oral BID  . feeding supplement (NEPRO CARB STEADY)  237 mL Oral BID BM  . ferric gluconate (FERRLECIT/NULECIT) IV  62.5 mg Intravenous Q Thu-HD  .  heparin  5,000 Units Subcutaneous 3 times per day  . hydroxychloroquine  400 mg Oral Daily  . multivitamin  1 tablet Oral Daily

## 2016-03-07 NOTE — Progress Notes (Signed)
Subjective: Seen on dialysis doing well. Continues to have cough that is intermittently productive. Abdominal pain and swelling improved from yesterday. After some discussion she is not interested in a paracentesis for potentially symptomatic treatment since her ascites is improving with dialysis.  Objective: Vital signs in last 24 hours: Filed Vitals:   03/07/16 0800 03/07/16 0830 03/07/16 0900 03/07/16 0930  BP: 120/82 123/80 108/73 124/92  Pulse: 97 107 97 102  Temp:      TempSrc:      Resp:      Height:      Weight:      SpO2:       Weight change: -2.6 kg (-5 lb 11.7 oz)  Intake/Output Summary (Last 24 hours) at 03/07/16 0948 Last data filed at 03/07/16 0600  Gross per 24 hour  Intake    600 ml  Output   2000 ml  Net  -1400 ml   GENERAL- Thin appearing woman, co-operative, NAD HEENT- Oral mucosa appears moist CARDIAC- RRR, no murmurs, rubs or gallops. RESP- CTAB, no wheezes or crackles ABDOMEN- Minimally TTP throughout, normal bowel sounds, nondistended EXTREMITIES- symmetric, no pedal edema, LUE AVF in use PSYCH- Normal mood and affect, appropriate thought content and speech.  Lab Results: Basic Metabolic Panel:  Recent Labs Lab 03/05/16 0844 03/06/16 1817 03/07/16 0715  NA 138 138 139  K 5.1 3.6 3.5  CL 99* 97* 98*  CO2 24 28 28   GLUCOSE 71 88 78  BUN 64* 11 18  CREATININE 8.87* 2.98* 4.29*  CALCIUM 8.3* 8.2* 7.7*  PHOS 7.4*  --  3.6   Liver Function Tests:  Recent Labs Lab 03/06/16 1817 03/07/16 0715  AST 30  --   ALT 22  --   ALKPHOS 84  --   BILITOT 0.5  --   PROT 8.3*  --   ALBUMIN 2.6* 2.1*   CBC:  Recent Labs Lab 03/06/16 0852 03/07/16 0715  WBC 5.7 4.3  HGB 7.0* 7.4*  HCT 22.9* 24.4*  MCV 90.2 90.0  PLT 115* 137*   Cardiac Enzymes:  Recent Labs Lab 03/03/16 0940  TROPONINI 0.06*   Urine Drug Screen: Drugs of Abuse     Component Value Date/Time   LABOPIA NONE DETECTED 03/04/2016 0359   COCAINSCRNUR POSITIVE*  03/04/2016 0359   LABBENZ NONE DETECTED 03/04/2016 0359   AMPHETMU NONE DETECTED 03/04/2016 0359   THCU NONE DETECTED 03/04/2016 0359   LABBARB NONE DETECTED 03/04/2016 0359    Micro Results: Recent Results (from the past 240 hour(s))  MRSA PCR Screening     Status: None   Collection Time: 03/04/16 12:37 AM  Result Value Ref Range Status   MRSA by PCR NEGATIVE NEGATIVE Final    Comment:        The GeneXpert MRSA Assay (FDA approved for NASAL specimens only), is one component of a comprehensive MRSA colonization surveillance program. It is not intended to diagnose MRSA infection nor to guide or monitor treatment for MRSA infections.   Culture, blood (Routine X 2) w Reflex to ID Panel     Status: None (Preliminary result)   Collection Time: 03/05/16  8:30 AM  Result Value Ref Range Status   Specimen Description BLOOD HEMODIALYSIS FISTULA  Final   Special Requests BOTTLES DRAWN AEROBIC AND ANAEROBIC 10CC  Final   Culture NO GROWTH < 24 HOURS  Final   Report Status PENDING  Incomplete  Culture, blood (Routine X 2) w Reflex to ID Panel  Status: None (Preliminary result)   Collection Time: 03/05/16  9:00 AM  Result Value Ref Range Status   Specimen Description BLOOD HEMODIALYSIS FISTULA  Final   Special Requests BOTTLES DRAWN AEROBIC AND ANAEROBIC 10CC  Final   Culture NO GROWTH < 24 HOURS  Final   Report Status PENDING  Incomplete  Culture, body fluid-bottle     Status: None (Preliminary result)   Collection Time: 03/06/16 10:59 AM  Result Value Ref Range Status   Specimen Description FLUID PLEURAL LEFT  Final   Special Requests NONE  Final   Gram Stain   Final    GRAM POSITIVE COCCI IN CLUSTERS AEROBIC BOTTLE ONLY CRITICAL RESULT CALLED TO, READ BACK BY AND VERIFIED WITH: S CALWINTON 03/07/16 @ 0922 M VESTAL    Culture GRAM POSITIVE COCCI  Final   Report Status PENDING  Incomplete  Gram stain     Status: None   Collection Time: 03/06/16 10:59 AM  Result Value Ref  Range Status   Specimen Description FLUID PLEURAL LEFT  Final   Special Requests NONE  Final   Gram Stain NO WBC SEEN NO ORGANISMS SEEN   Final   Report Status 03/06/2016 FINAL  Final   Studies/Results:  Medications: I have reviewed the patient's current medications. Scheduled Meds: . amLODipine  10 mg Oral Daily  . calcitRIOL  1.5 mcg Oral Q T,Th,Sa-HD  . calcium acetate  2,001 mg Oral TID WC  . cinacalcet  30 mg Oral Q supper  . dextromethorphan-guaiFENesin  1 tablet Oral BID  . feeding supplement (NEPRO CARB STEADY)  237 mL Oral BID BM  . ferric gluconate (FERRLECIT/NULECIT) IV  62.5 mg Intravenous Q Thu-HD  . heparin  5,000 Units Subcutaneous 3 times per day  . HYDROmorphone      . hydroxychloroquine  400 mg Oral Daily  . multivitamin  1 tablet Oral Daily   Continuous Infusions:  PRN Meds:.sodium chloride, sodium chloride, acetaminophen, albuterol, alteplase, gi cocktail, heparin, HYDROmorphone (DILAUDID) injection, lidocaine (PF), lidocaine-prilocaine, pentafluoroprop-tetrafluoroeth Assessment/Plan: Hypoxia 2/2 pulmonary edema, hypertensive crisis- resolved: Cough improving and not hypoxic, continuing to remove volume on HD.   Hypervolemia, with pleural effusions, ascites: Pleural effusion fluid analysis transudative but preliminary Cx GPC in clusters. Possibly contaminant as clinically improving without antibiotics and no signs/symptoms of infection. Improving, needs dry weight adjustment on HD likely loss of weight from poor nutrition vs chronic disease vs substance abuse. Does not want paracentesis and improving with HD.  ESRD on HD: Continue HD per nephrology. Agree with plans for ECHO due to her marked volume overload despite being below dry weight. At increased risk of problems with her cocaine use. -Nephrology consulted, recs appreciated -MBD, AOCD management per nephrology  Acute on chronic anemia: Hgb 7s, not clear if previous 11.7 value spurious. No evidence of acute  blood loss anywhere negative FOBT no blood on CT. LDH slightly elevated. -F/U CBCs  SLE: Stable. Plaquenil 400mg  Hypertensive emergency: 2/2 cocaine use, improved. Amlodipine 10mg   Diet: Renal VTE ppx: Heparin FULL CODE  Dispo: Pending further clinical improvement. Likely discharge to home tomorrow if symptoms are continuing to improve.   The patient does have a current PCP Lucious Groves, DO) and does need an [facility] hospital follow-up appointment after discharge.  The patient does not know have transportation limitations that hinder transportation to clinic appointments.   LOS: 4 days   Collier Salina, MD 03/07/2016, 9:48 AM

## 2016-03-08 ENCOUNTER — Inpatient Hospital Stay (HOSPITAL_COMMUNITY): Payer: Medicaid Other

## 2016-03-08 DIAGNOSIS — I161 Hypertensive emergency: Secondary | ICD-10-CM

## 2016-03-08 DIAGNOSIS — I509 Heart failure, unspecified: Secondary | ICD-10-CM

## 2016-03-08 DIAGNOSIS — I429 Cardiomyopathy, unspecified: Secondary | ICD-10-CM

## 2016-03-08 DIAGNOSIS — I110 Hypertensive heart disease with heart failure: Secondary | ICD-10-CM

## 2016-03-08 DIAGNOSIS — F1410 Cocaine abuse, uncomplicated: Secondary | ICD-10-CM

## 2016-03-08 DIAGNOSIS — I5021 Acute systolic (congestive) heart failure: Secondary | ICD-10-CM

## 2016-03-08 LAB — ECHOCARDIOGRAM COMPLETE
HEIGHTINCHES: 64 in
WEIGHTICAEL: 1548.51 [oz_av]

## 2016-03-08 MED ORDER — OXYCODONE-ACETAMINOPHEN 5-325 MG PO TABS
1.0000 | ORAL_TABLET | Freq: Four times a day (QID) | ORAL | Status: DC | PRN
  Administered 2016-03-08 – 2016-03-09 (×4): 1 via ORAL
  Filled 2016-03-08 (×4): qty 1

## 2016-03-08 MED ORDER — TEMAZEPAM 7.5 MG PO CAPS
7.5000 mg | ORAL_CAPSULE | Freq: Every evening | ORAL | Status: DC | PRN
  Administered 2016-03-08: 7.5 mg via ORAL
  Filled 2016-03-08: qty 1

## 2016-03-08 MED ORDER — CARVEDILOL 3.125 MG PO TABS
3.1250 mg | ORAL_TABLET | Freq: Two times a day (BID) | ORAL | Status: DC
  Administered 2016-03-08: 3.125 mg via ORAL
  Filled 2016-03-08: qty 1

## 2016-03-08 MED ORDER — DICLOFENAC SODIUM 1 % TD GEL
2.0000 g | Freq: Three times a day (TID) | TRANSDERMAL | Status: DC | PRN
  Administered 2016-03-08: 2 g via TOPICAL
  Filled 2016-03-08: qty 100

## 2016-03-08 MED ORDER — ISOSORBIDE MONONITRATE ER 30 MG PO TB24
30.0000 mg | ORAL_TABLET | Freq: Every day | ORAL | Status: DC
  Administered 2016-03-08 – 2016-03-09 (×2): 30 mg via ORAL
  Filled 2016-03-08 (×2): qty 1

## 2016-03-08 MED ORDER — HYDRALAZINE HCL 25 MG PO TABS
25.0000 mg | ORAL_TABLET | Freq: Three times a day (TID) | ORAL | Status: DC
  Administered 2016-03-08 – 2016-03-09 (×3): 25 mg via ORAL
  Filled 2016-03-08 (×3): qty 1

## 2016-03-08 MED ORDER — AMLODIPINE BESYLATE 5 MG PO TABS: 2.5000 mg | ORAL_TABLET | Freq: Every day | ORAL | Status: DC

## 2016-03-08 MED ORDER — AMLODIPINE BESYLATE 5 MG PO TABS
2.5000 mg | ORAL_TABLET | Freq: Two times a day (BID) | ORAL | Status: DC
  Administered 2016-03-08: 2.5 mg via ORAL
  Filled 2016-03-08: qty 1

## 2016-03-08 NOTE — Progress Notes (Signed)
  Echocardiogram 2D Echocardiogram has been performed.  [name] 03/08/2016, 9:11 AM

## 2016-03-08 NOTE — Progress Notes (Signed)
Patient requesting something stronger than Tylenol for pain.  MD notified.

## 2016-03-08 NOTE — Progress Notes (Signed)
Subjective:  No complaints.  Another 3 L off w hD yesterday. Wt down 51 > 44kg here, 5 under dry wt. Still coughing. No fever, nl WBC.   Objective Vital signs in last 24 hours: Filed Vitals:   03/07/16 1611 03/07/16 2040 03/08/16 0445 03/08/16 0612  BP: 109/75 124/90 122/80 155/87  Pulse: 96 92 93 97  Temp: 98.4 F (36.9 C) 98.2 F (36.8 C) 98.4 F (36.9 C) 98 F (36.7 C)  TempSrc: Oral Oral Oral Oral  Resp: 18 18 18 18   Height:      Weight:      SpO2: 97% 100% 97% 97%   Weight change: -4.6 kg (-10 lb 2.3 oz) Physical Exam General: thin young AAF/chronically  ill appearing  Heart: RRR no MRG Lungs:BS  dim bilat bases Abdomen: soft NT/ ND Extremities: no pedal edema Dialysis Access: Left upper AVF pos bruit    OP Dialysis: [facility] 3h 26min 49kg 2/2.5 bath LUA AVF Hep none 9.9/ 33%/ 739 > darbe 200 ug/wk next 3/7, venofer 50/wk 8.8/ 5.9/ 56 pth (down from 637) > sensipar 30, phoslo 6 ac, calcitriol 1.5ug BP's high pre and post, small wt gains and gets to dry wt; leaves early between 2h- 3h 24min  Assessment: 1 Acute resp failure- resolved 2 Volume excess/ pleural effusions/ ascites - volume down but still wet on exam 3 HTN'sive crisis - resolved 3 ESRD HD TTS 4 Cocaine abuse 5 Anemia Hgb 7's, no active bleed and abd CT negative. Follow. 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 Hx noncompliance with dialysis meds 10. Protein malnutrition alb 2.1- added nepro 11 Weight loss - poss drug abuse and/ or chronic uremia from signing off HD early all the time. HIV and hep C were checked and they are negative.   Plan - would keep for one more inpatient HD tomorrow, get vol down further.  ECHO results pending.   Plan extra HD Mon am.  Reduce BP meds  Kelly Splinter MD [facility] pager [phone]    cell [phone] 03/08/2016, 11:23 AM    Labs: Basic Metabolic Panel:  Recent Labs Lab 03/04/16 0253 03/05/16 0844  03/06/16 1817 03/07/16 0715  NA 139 138 138 139  K 4.0 5.1 3.6 3.5  CL 100* 99* 97* 98*  CO2 27 24 28 28   GLUCOSE 98 71 88 78  BUN 37* 64* 11 18  CREATININE 6.68* 8.87* 2.98* 4.29*  CALCIUM 7.9* 8.3* 8.2* 7.7*  PHOS 4.4 7.4*  --  3.6   Liver Function Tests:  Recent Labs Lab 03/05/16 0844 03/06/16 1817 03/07/16 0715  AST  --  30  --   ALT  --  22  --   ALKPHOS  --  84  --   BILITOT  --  0.5  --   PROT  --  8.3*  --   ALBUMIN 2.1* 2.6* 2.1*   No results for input(s): LIPASE, AMYLASE in the last 168 hours. No results for input(s): AMMONIA in the last 168 hours. CBC:  Recent Labs Lab 03/05/16 1453 03/05/16 2104 03/06/16 0056 03/06/16 0852 03/07/16 0715  WBC 7.7 7.5 7.1 5.7 4.3  HGB 7.4* 7.6* 7.5* 7.0* 7.4*  HCT 23.8* 24.2* 24.6* 22.9* 24.4*  MCV 88.5 89.0 89.1 90.2 90.0  PLT 90* 102* 101* 115* 137*   Cardiac Enzymes:  Recent Labs Lab 03/03/16 0940  TROPONINI 0.06*   CBG: No results for input(s):  GLUCAP in the last 168 hours. Medications:   . amLODipine  10 mg Oral Daily  . calcitRIOL  1.5 mcg Oral Q T,Th,Sa-HD  . calcium acetate  2,001 mg Oral TID WC  . cinacalcet  30 mg Oral Q supper  . dextromethorphan-guaiFENesin  1 tablet Oral BID  . feeding supplement (NEPRO CARB STEADY)  237 mL Oral BID BM  . ferric gluconate (FERRLECIT/NULECIT) IV  62.5 mg Intravenous Q Thu-HD  . heparin  5,000 Units Subcutaneous 3 times per day  . hydroxychloroquine  400 mg Oral Daily  . multivitamin  1 tablet Oral Daily

## 2016-03-08 NOTE — Consult Note (Addendum)
CARDIOLOGY CONSULT NOTE   Patient ID: ADAYSHA TREVOR MRN: [id number], DOB/AGE: 33-17-1982   Admit date: 03/03/2016 Date of Consult: 03/08/2016  Primary Physician: Lucious Groves, DO Primary Cardiologist: none  Reason for consult:  CHF  Problem List  Past Medical History  Diagnosis Date  . Lupus (systemic lupus erythematosus) (HCC)     Previously followed with Dr. Charlestine Night, has not followed up recently  . 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 S/P pericardiocentesis 01/17/2013    H/o pericardial effusion with tamponade 2006   . H/O pleural effusion 01/17/2013  . H/O pericarditis 01/17/2013  . Polysubstance abuse     cocaine, MJ, tobacco  . Benign hypertension with ESRD (end-stage renal disease) ([place]) 03/15/2014    Past Surgical History  Procedure Laterality Date  . Bascilic vein transposition Left 02/05/2014    Procedure: [place];  Surgeon: [name], MD;  Location: [place];  Service: Vascular;  Laterality: Left;  Annell Greening Right 01/31/2014    Procedure: DIALYSIS CATHETER;  Surgeon: Serafina Mitchell, MD;  Location: [facility] CATH LAB;  Service: Cardiovascular;  Laterality: Right;    Allergies  Allergies  Allergen Reactions  . 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 Symptoms started on Sunday and progressively worsened until her presentation today. She was previously feeling well and tolerated hemodialysis on Saturday without incident. She reports not taking many of her home medicines as well as eating and  drinking more than is recommended or normal for her over the weekend. She denies a productive cough or chest pain. She is not feeling fevers, chills, nausea has no vomiting or diarrhea. On arrival to emergency department she is in significant respiratory distress partially improved on BiPAP. Systolic blood pressure as high as 230s and placed on nicardipine drip. Chest x-ray was obtained showing extensive bilateral traits and effusions. Besides hypervolemia metabolic profile is as expected for ESRD patient. Nephrology was consulted for emergent hemodialysis and in her medicine consult is for admission to the hospital for her respiratory failure. She underwent left thoracentesis and daily HD with removal of 10 lbs of fluids with significant improvement of symptoms. She denies chest pain or SOB at rest now.  Inpatient Medications  . amLODipine  2.5 mg Oral BID  . calcitRIOL  1.5 mcg Oral Q T,Th,Sa-HD  . calcium acetate  2,001 mg Oral TID WC  . cinacalcet  30 mg Oral Q supper  . dextromethorphan-guaiFENesin  1 tablet Oral BID  . feeding supplement (NEPRO CARB STEADY)  237 mL Oral BID BM  . ferric gluconate (FERRLECIT/NULECIT) IV  62.5 mg Intravenous Q Thu-HD  . heparin  5,000 Units Subcutaneous 3 times per day  . hydroxychloroquine  400 mg Oral Daily  . multivitamin  1 tablet Oral Daily   Family History History reviewed. No pertinent family history.   Social History Social History   Social History  . Marital Status: Single    Spouse Name: N/A  . Number of Children: N/A  . Years of Education: N/A   Occupational History  . Not on file.   Social History Main Topics  .  Smoking status: Current Some Day Smoker -- 0.25 packs/day for 15 years  . Smokeless tobacco: Never Used     Comment: quit smoking s/p hospital discharge/ SMOKES ABOUT 2-3 CIGARETTES A DAY  . Alcohol Use: Yes     Comment: occ  . Drug Use: 5.00 per week    Special: Marijuana, Cocaine     Comment: last used thanksgiving,  per pt 03/03/16  . Sexual Activity: Yes   Other Topics Concern  . Not on file   Social History Narrative     Review of Systems  General:  No chills, fever, night sweats or weight changes.  Cardiovascular:  No chest pain, dyspnea on exertion, edema, orthopnea, palpitations, paroxysmal nocturnal dyspnea. Dermatological: No rash, lesions/masses Respiratory: No cough, dyspnea Urologic: No hematuria, dysuria Abdominal:   No nausea, vomiting, diarrhea, bright red blood per rectum, melena, or hematemesis Neurologic:  No visual changes, wkns, changes in mental status. All other systems reviewed and are otherwise negative except as noted above.  Physical Exam  Blood pressure 124/86, pulse 95, temperature 98 F (36.7 C), temperature source Oral, resp. rate 17, height 5\' 4"  (1.626 m), weight 96 lb 12.5 oz (43.9 kg), last menstrual period 10/27/2015, SpO2 100 %.  General: Pleasant, NAD Psych: Normal affect. Neuro: Alert and oriented X 3. Moves all extremities spontaneously. HEENT: Normal  Neck: Supple without bruits or JVD. Lungs:  Resp regular and unlabored, decreased BS in the right base. Heart: RRR no s3, s4, 4/6 systolic murmur. Abdomen: Soft, non-tender, non-distended, BS + x 4.  Extremities: No clubbing, cyanosis or edema. DP/PT/Radials 2+ and equal bilaterally.  Labs  No results for input(s): CKTOTAL, CKMB, TROPONINI in the last 72 hours. Lab Results  Component Value Date   WBC 4.3 03/07/2016   HGB 7.4* 03/07/2016   HCT 24.4* 03/07/2016   MCV 90.0 03/07/2016   PLT 137* 03/07/2016    Recent Labs Lab 03/06/16 1817 03/07/16 0715  NA 138 139  K 3.6 3.5  CL 97* 98*  CO2 28 28  BUN 11 18  CREATININE 2.98* 4.29*  CALCIUM 8.2* 7.7*  PROT 8.3*  --   BILITOT 0.5  --   ALKPHOS 84  --   ALT 22  --   AST 30  --   GLUCOSE 88 78   Lab Results  Component Value Date   CHOL 190 01/30/2014   HDL 25* 01/30/2014   LDLCALC 116* 01/30/2014   TRIG 244* 01/30/2014   No results  found for: DDIMER Invalid input(s): POCBNP  Radiology/Studies  Ct Abdomen Pelvis W Wo Contrast  03/06/2016  CLINICAL DATA:  Abdominal pain, weakness, and cough.. IMPRESSION: Bilateral pleural effusions with lower lung zone consolidation atelectasis is well is patchy infiltration suggesting pneumonia or edema. Diffuse cardiac enlargement. Prominent abdominal and pelvic ascites with diffuse edema throughout the subcutaneous fat. Visualization of internal abdominal/pelvic structures is limited but there is no evidence of bowel obstruction or free air. Electronically Signed   By: Lucienne Capers M.D.   On: 03/06/2016 02:25   Dg Chest 1 View  03/06/2016  CLINICAL DATA:  Status post left thoracentesis.  Cough. EXAM: CHEST 1 VIEW COMPARISON:  03/05/2016 FINDINGS: Cardiomegaly again noted. Decreased left pleural effusion noted without evidence of pneumothorax. Bilateral airspace disease/ consolidation again noted. A small right pleural effusion is again noted. IMPRESSION: Decreased left pleural effusion status post thoracentesis. No evidence of pneumothorax. Bilateral lower lung airspace disease/consolidation and small right pleural effusion again noted Electronically Signed  ByDellis Filbert    Echocardiogram - 01/18/2013  - Left ventricle: The cavity size was normal. Wall thickness was normal. Systolic function was normal. The estimated ejection fraction was in the range of 55% to 60%. Wall motion was normal; there were no regional wall motion abnormalities. Left ventricular diastolic function parameters were normal. - Aortic valve: There was no stenosis. - Mitral valve: Trivial regurgitation. - Left atrium: The atrium was mildly dilated. - Right ventricle: The cavity size was normal. Systolic function was normal. - Pulmonary arteries: PA peak pressure: 24mm Hg (S). - Inferior vena cava: The vessel was normal in size; the respirophasic diameter changes were in the normal range  (= 50%); findings are consistent with normal central venous pressure. Impressions:  - Normal LV size and systolic function, EF 0000000. Normal RV size and systolic function. No significant valvular abnormalities. Mild LAE.  Echocardiogram - 03/08/2016  - Left ventricle: The cavity size was normal. Wall thickness was  increased in a pattern of moderate LVH. Systolic function was  severely reduced. The estimated ejection fraction was in the  range of 20% to 25%. Diffuse hypokinesis. Relative increased  echodensity of myocardium could be seen in the setting of ESRD,  although cannot exclude infiltrative cardiomyopathy. There is  also prominent LV trabeculation noted in certain views which  could be seen with a non-compaction syndrome, although this study  is not specifically diagnostic of such. Could consider a cardiac  MRI to investigate these possibilities further. The study is not  technically sufficient to allow evaluation of LV diastolic  function. - Mitral valve: Mildly thickened leaflets . There was moderate to  severe regurgitation. Regurgitant volume (PISA): 52 ml. - Left atrium: The atrium was severely dilated. - Right atrium: Central venous pressure (est): 3 mm Hg. - Tricuspid valve: There was mild regurgitation. - Pulmonary arteries: PA peak pressure: 26 mm Hg (S). - Pericardium, extracardiac: There was a left pleural effusion.  Impressions: - Moderate LVH with LVEF 20-25% and diffuse hypokinesis. LVEF has  decreased compared to the previous study in 2014. Relative  increased echodensity of myocardium could be seen in the setting  of ESRD, although cannot exclude infiltrative cardiomyopathy.  There is also prominent LV trabeculation noted in certain views  which could be seen with a non-compaction syndrome, although this  study is not specifically diagnostic of such. Could consider a  cardiac MRI to investigate these possibilities further.  Diastolic  function is indeterminant. Severe left atrial enlargement. Mildly  thickened mitral leaflets with moderate to severe mitral  regurgitation (higher end of spectrum). Mild tricuspid  regurgitation with PASP estimated 26 mmHg. Left pleural effusion.  ECG: ST, 113/BPM, non-specific st- t wave abnormalities, no significant change from prior    ASSESSMENT AND PLAN  1. Cardiomyopathy - severe LV dysfunction with LVEF 20-25%, moderate concentric LVH and hypertrabeculation of the apex. This is new, there was normal LVEF 55-60 in 2006, 2007 and in 2014.  The etiology possibly: poorly controlled hypertension, SLE, cocaine abuse, non-compaction cardiomyopathy. We will schedule a cardiac MRI in 6 weeks - re-evaluate for LVEF with the optimal medical therapy and evaluate for possible non-compaction cardiomyopathy.  - d/c amlodipine, start hydralazine 25 mg po TID and imdur 30 mg po daily - no ACEI/ARB with ESRD - start low dose carvedilol 3.125 mg po BID, she is consulted about cocaine abuse  2. Acute on chronic systolic CHF - on daily HD, down to 96 lbs, - 10 lbs since the admission, managed  by HD  3. Hypertensive urgency with hypertensive heart disease - as above  4. Cocaine abuse - counseling provided    Signed, Dorothy Spark, MD, [facility] 03/08/2016, 1:02 PM

## 2016-03-08 NOTE — Progress Notes (Signed)
Subjective: No acute events overnight. Has some mild chest pain worsened on deep inspiration, intermittent, without associated symptoms. Cough much decreased. Abdominal pain improved.  Objective: Vital signs in last 24 hours: Filed Vitals:   03/07/16 1611 03/07/16 2040 03/08/16 0445 03/08/16 0612  BP: 109/75 124/90 122/80 155/87  Pulse: 96 92 93 97  Temp: 98.4 F (36.9 C) 98.2 F (36.8 C) 98.4 F (36.9 C) 98 F (36.7 C)  TempSrc: Oral Oral Oral Oral  Resp: 18 18 18 18   Height:      Weight:      SpO2: 97% 100% 97% 97%   Weight change: -4.6 kg (-10 lb 2.3 oz)  Intake/Output Summary (Last 24 hours) at 03/08/16 1034 Last data filed at 03/08/16 0600  Gross per 24 hour  Intake    960 ml  Output      0 ml  Net    960 ml   GENERAL- Thin chronically ill appearing woman, co-operative, NAD CARDIAC- RRR, no murmurs, rubs or gallops. RESP- slightly diminished air movement in bases, no wheezes or crackles ABDOMEN- Nontender, nondistended EXTREMITIES- symmetric, no pedal edema, LUE AVF PSYCH- Normal mood and affect, appropriate thought content and speech.  Lab Results: Basic Metabolic Panel:  Recent Labs Lab 03/05/16 0844 03/06/16 1817 03/07/16 0715  NA 138 138 139  K 5.1 3.6 3.5  CL 99* 97* 98*  CO2 24 28 28   GLUCOSE 71 88 78  BUN 64* 11 18  CREATININE 8.87* 2.98* 4.29*  CALCIUM 8.3* 8.2* 7.7*  PHOS 7.4*  --  3.6   Liver Function Tests:  Recent Labs Lab 03/06/16 1817 03/07/16 0715  AST 30  --   ALT 22  --   ALKPHOS 84  --   BILITOT 0.5  --   PROT 8.3*  --   ALBUMIN 2.6* 2.1*   CBC:  Recent Labs Lab 03/06/16 0852 03/07/16 0715  WBC 5.7 4.3  HGB 7.0* 7.4*  HCT 22.9* 24.4*  MCV 90.2 90.0  PLT 115* 137*   Cardiac Enzymes:  Recent Labs Lab 03/03/16 0940  TROPONINI 0.06*   Urine Drug Screen: Drugs of Abuse     Component Value Date/Time   LABOPIA NONE DETECTED 03/04/2016 0359   COCAINSCRNUR POSITIVE* 03/04/2016 0359   LABBENZ NONE DETECTED  03/04/2016 0359   AMPHETMU NONE DETECTED 03/04/2016 0359   THCU NONE DETECTED 03/04/2016 0359   LABBARB NONE DETECTED 03/04/2016 0359    Micro Results: Recent Results (from the past 240 hour(s))  MRSA PCR Screening     Status: None   Collection Time: 03/04/16 12:37 AM  Result Value Ref Range Status   MRSA by PCR NEGATIVE NEGATIVE Final    Comment:        The GeneXpert MRSA Assay (FDA approved for NASAL specimens only), is one component of a comprehensive MRSA colonization surveillance program. It is not intended to diagnose MRSA infection nor to guide or monitor treatment for MRSA infections.   Culture, blood (Routine X 2) w Reflex to ID Panel     Status: None (Preliminary result)   Collection Time: 03/05/16  8:30 AM  Result Value Ref Range Status   Specimen Description BLOOD HEMODIALYSIS FISTULA  Final   Special Requests BOTTLES DRAWN AEROBIC AND ANAEROBIC 10CC  Final   Culture NO GROWTH 3 DAYS  Final   Report Status PENDING  Incomplete  Culture, blood (Routine X 2) w Reflex to ID Panel     Status: None (Preliminary result)  Collection Time: 03/05/16  9:00 AM  Result Value Ref Range Status   Specimen Description BLOOD HEMODIALYSIS FISTULA  Final   Special Requests BOTTLES DRAWN AEROBIC AND ANAEROBIC 10CC  Final   Culture NO GROWTH 3 DAYS  Final   Report Status PENDING  Incomplete  Culture, body fluid-bottle     Status: None (Preliminary result)   Collection Time: 03/06/16 10:59 AM  Result Value Ref Range Status   Specimen Description FLUID PLEURAL LEFT  Final   Special Requests NONE  Final   Gram Stain   Final    GRAM POSITIVE COCCI IN CLUSTERS AEROBIC BOTTLE ONLY CRITICAL RESULT CALLED TO, READ BACK BY AND VERIFIED WITH: S CALWINTON 03/07/16 @ 0922 M VESTAL    Culture   Final    GRAM POSITIVE COCCI IDENTIFICATION AND SUSCEPTIBILITIES TO FOLLOW    Report Status PENDING  Incomplete  Gram stain     Status: None   Collection Time: 03/06/16 10:59 AM  Result Value Ref  Range Status   Specimen Description FLUID PLEURAL LEFT  Final   Special Requests NONE  Final   Gram Stain NO WBC SEEN NO ORGANISMS SEEN   Final   Report Status 03/06/2016 FINAL  Final   Studies/Results:  Medications: I have reviewed the patient's current medications. Scheduled Meds: . amLODipine  10 mg Oral Daily  . calcitRIOL  1.5 mcg Oral Q T,Th,Sa-HD  . calcium acetate  2,001 mg Oral TID WC  . cinacalcet  30 mg Oral Q supper  . dextromethorphan-guaiFENesin  1 tablet Oral BID  . feeding supplement (NEPRO CARB STEADY)  237 mL Oral BID BM  . ferric gluconate (FERRLECIT/NULECIT) IV  62.5 mg Intravenous Q Thu-HD  . heparin  5,000 Units Subcutaneous 3 times per day  . hydroxychloroquine  400 mg Oral Daily  . multivitamin  1 tablet Oral Daily   Continuous Infusions:  PRN Meds:.acetaminophen, albuterol, diclofenac sodium, gi cocktail, temazepam Assessment/Plan: Hypoxia 2/2 pulmonary edema, hypertensive crisis- resolved: Cough improving and not hypoxic, continuing to remove volume on HD.   Hypervolemia, with pleural effusions, ascites: Pleural effusion fluid analysis transudative but preliminary Cx GPC in clusters. Likely contaminant as clinically improving without antibiotics and no signs/symptoms of infection. Cough improving still has some chest pain. Improving, had large dry weight discrepency on HD likely loss of weight with chronic substance abuse. -F/U TTE -CXR this AM -No abtx indicated at this time -topical diclofenac chest wall pain  ESRD on HD: Continue HD per nephrology. -Nephrology consulted, recs appreciated -MBD, AOCD management per nephrology  Acute on chronic anemia: Hgb stable in 7s. Unclear change from 11.7 noted on admission, no clinical evidence of bleeding no blood on imaging.  Anxiety, insomnia: On restaril PTA, will resume qhs PRN. SLE: Stable. Plaquenil 400mg  Hypertensive emergency: 2/2 cocaine use, improved. Amlodipine 10mg   Diet: Renal VTE ppx:  Heparin FULL CODE  Dispo: Pending further clinical improvement. Possible discharge to home tomorrow if symptoms are continuing to improve.  The patient does have a current PCP Lucious Groves, DO) and does need an [facility] hospital follow-up appointment after discharge.  The patient does not know have transportation limitations that hinder transportation to clinic appointments.   LOS: 5 days   Collier Salina, MD 03/08/2016, 10:34 AM

## 2016-03-09 DIAGNOSIS — R634 Abnormal weight loss: Secondary | ICD-10-CM

## 2016-03-09 DIAGNOSIS — R059 Cough, unspecified: Secondary | ICD-10-CM | POA: Insufficient documentation

## 2016-03-09 DIAGNOSIS — I132 Hypertensive heart and chronic kidney disease with heart failure and with stage 5 chronic kidney disease, or end stage renal disease: Principal | ICD-10-CM

## 2016-03-09 DIAGNOSIS — R05 Cough: Secondary | ICD-10-CM | POA: Insufficient documentation

## 2016-03-09 LAB — CBC WITH DIFFERENTIAL/PLATELET
BASOS ABS: 0 10*3/uL (ref 0.0–0.1)
Basophils Relative: 1 %
EOS ABS: 0.5 10*3/uL (ref 0.0–0.7)
EOS PCT: 11 %
HCT: 27.2 % — ABNORMAL LOW (ref 36.0–46.0)
Hemoglobin: 7.9 g/dL — ABNORMAL LOW (ref 12.0–15.0)
Lymphocytes Relative: 24 %
Lymphs Abs: 1 10*3/uL (ref 0.7–4.0)
MCH: 25.7 pg — ABNORMAL LOW (ref 26.0–34.0)
MCHC: 29 g/dL — ABNORMAL LOW (ref 30.0–36.0)
MCV: 88.6 fL (ref 78.0–100.0)
MONO ABS: 0.5 10*3/uL (ref 0.1–1.0)
Monocytes Relative: 11 %
Neutro Abs: 2.1 10*3/uL (ref 1.7–7.7)
Neutrophils Relative %: 53 %
PLATELETS: 165 10*3/uL (ref 150–400)
RBC: 3.07 MIL/uL — AB (ref 3.87–5.11)
RDW: 19 % — AB (ref 11.5–15.5)
WBC: 4.1 10*3/uL (ref 4.0–10.5)

## 2016-03-09 LAB — RENAL FUNCTION PANEL
Albumin: 2.2 g/dL — ABNORMAL LOW (ref 3.5–5.0)
Anion gap: 12 (ref 5–15)
BUN: 41 mg/dL — AB (ref 6–20)
CALCIUM: 7.6 mg/dL — AB (ref 8.9–10.3)
CHLORIDE: 100 mmol/L — AB (ref 101–111)
CO2: 24 mmol/L (ref 22–32)
CREATININE: 7.09 mg/dL — AB (ref 0.44–1.00)
GFR, EST AFRICAN AMERICAN: 8 mL/min — AB (ref >60)
GFR, EST NON AFRICAN AMERICAN: 7 mL/min — AB (ref >60)
Glucose, Bld: 83 mg/dL (ref 65–99)
Phosphorus: 4.3 mg/dL (ref 2.5–4.6)
Potassium: 4.4 mmol/L (ref 3.5–5.1)
SODIUM: 136 mmol/L (ref 135–145)

## 2016-03-09 LAB — CULTURE, BODY FLUID W GRAM STAIN -BOTTLE

## 2016-03-09 LAB — CULTURE, BODY FLUID-BOTTLE

## 2016-03-09 LAB — PH, BODY FLUID: pH, Body Fluid: 7.8

## 2016-03-09 MED ORDER — LIDOCAINE-PRILOCAINE 2.5-2.5 % EX CREA
1.0000 "application " | TOPICAL_CREAM | CUTANEOUS | Status: DC | PRN
  Filled 2016-03-09: qty 5

## 2016-03-09 MED ORDER — HEPARIN SODIUM (PORCINE) 1000 UNIT/ML DIALYSIS: 1000.0000 [IU] | INTRAMUSCULAR | Status: DC | PRN

## 2016-03-09 MED ORDER — PENTAFLUOROPROP-TETRAFLUOROETH EX AERO: 1.0000 "application " | INHALATION_SPRAY | CUTANEOUS | Status: DC | PRN

## 2016-03-09 MED ORDER — ONDANSETRON 4 MG PO TBDP
4.0000 mg | ORAL_TABLET | Freq: Three times a day (TID) | ORAL | Status: DC | PRN
  Administered 2016-03-09: 4 mg via ORAL
  Filled 2016-03-09: qty 1

## 2016-03-09 MED ORDER — ONDANSETRON 4 MG PO TBDP: 4.0000 mg | ORAL_TABLET | Freq: Three times a day (TID) | ORAL | Status: DC | PRN

## 2016-03-09 MED ORDER — LIDOCAINE HCL (PF) 1 % IJ SOLN: 5.0000 mL | INTRAMUSCULAR | Status: DC | PRN

## 2016-03-09 MED ORDER — CARVEDILOL 3.125 MG PO TABS: 3.1250 mg | ORAL_TABLET | Freq: Two times a day (BID) | ORAL | Status: DC

## 2016-03-09 MED ORDER — OXYCODONE-ACETAMINOPHEN 5-325 MG PO TABS: 1.0000 | ORAL_TABLET | Freq: Three times a day (TID) | ORAL | Status: DC | PRN

## 2016-03-09 MED ORDER — SODIUM CHLORIDE 0.9 % IV SOLN: 100.0000 mL | INTRAVENOUS | Status: DC | PRN

## 2016-03-09 MED ORDER — ISOSORBIDE MONONITRATE ER 30 MG PO TB24: 30.0000 mg | ORAL_TABLET | Freq: Every day | ORAL | Status: DC

## 2016-03-09 MED ORDER — SODIUM CHLORIDE 0.9 % IV SOLN: 62.5000 mg | INTRAVENOUS | Status: DC

## 2016-03-09 MED ORDER — ALTEPLASE 2 MG IJ SOLR
2.0000 mg | Freq: Once | INTRAMUSCULAR | Status: DC | PRN
  Filled 2016-03-09: qty 2

## 2016-03-09 MED ORDER — HYDRALAZINE HCL 25 MG PO TABS: 25.0000 mg | ORAL_TABLET | Freq: Three times a day (TID) | ORAL | Status: DC

## 2016-03-09 NOTE — Procedures (Signed)
Tolerating HD.  Weight down. New EDW.  No recent SLE flare symptoms except reported malar rash not present now.  Alert and appropriate. No LE edema.   Tearra Ouk C

## 2016-03-09 NOTE — Discharge Summary (Signed)
Name: [name] MRN: [id number] DOB: Aug 14, 1981 35 y.o. PCP: Lucious Groves, DO  Date of Admission: 03/03/2016  9:22 AM Date of Discharge: 03/09/2016 Attending Physician: Truman Hayward, MD  Discharge Diagnosis: Active Problems:   ESRD (end stage renal disease) [facility])   Hypertensive emergency   Hemoptysis   Myalgia   Pulmonary edema   Crack cocaine overdose   Abdominal pain   Pleural effusion   Ascites   Follow up   Other hypervolemia   S/P thoracentesis   Cough  Discharge Medications:   Medication List    STOP taking these medications        amLODipine 5 MG tablet  Commonly known as:  NORVASC      TAKE these medications        albuterol 108 (90 Base) MCG/ACT inhaler  Commonly known as:  PROVENTIL HFA;VENTOLIN HFA  Inhale 1-2 puffs into the lungs every 6 (six) hours as needed for wheezing or shortness of breath.     calcium acetate 667 MG capsule  Commonly known as:  PHOSLO  Take 1,334-2,001 mg by mouth 3 (three) times daily with meals. Takes 2 with snacks and 3 with meals     carvedilol 3.125 MG tablet  Commonly known as:  COREG  Take 1 tablet (3.125 mg total) by mouth 2 (two) times daily with a meal.     cinacalcet 30 MG tablet  Commonly known as:  SENSIPAR  Take 30 mg by mouth daily.     darbepoetin 60 MCG/0.3ML Soln injection  Commonly known as:  ARANESP  Inject 0.3 mLs (60 mcg total) into the vein every Wednesday with hemodialysis.     doxycycline 100 MG tablet  Commonly known as:  ADOXA  Take 100 mg by mouth 2 (two) times daily.     feeding supplement (NEPRO CARB STEADY) Liqd  Take 237 mLs by mouth See admin instructions. At dialysis     ferric gluconate 62.5 mg in sodium chloride 0.9 % 100 mL  Inject 62.5 mg into the vein every Thursday with hemodialysis.     hydrALAZINE 25 MG tablet  Commonly known as:  APRESOLINE  Take 1 tablet (25 mg total) by mouth 3 (three) times daily.     hydroxychloroquine 200 MG tablet  Commonly known  as:  PLAQUENIL  Take 2 tablets (400 mg total) by mouth daily.     isosorbide mononitrate 30 MG 24 hr tablet  Commonly known as:  IMDUR  Take 1 tablet (30 mg total) by mouth daily.     multivitamin Tabs tablet  Take 1 tablet by mouth daily.     ondansetron 4 MG disintegrating tablet  Commonly known as:  ZOFRAN-ODT  Take 1 tablet (4 mg total) by mouth every 8 (eight) hours as needed for nausea or vomiting.     temazepam 15 MG capsule  Commonly known as:  RESTORIL  Take 15 mg by mouth at bedtime.     triamcinolone cream 0.1 %  Commonly known as:  KENALOG  Apply 1 application topically 2 (two) times daily.        Disposition and follow-up:   [name] was discharged from [facility] in Good condition.  At the hospital follow up visit please address:  1. New systolic congestive heart failure: Check for heart failure symptoms. At high risk for hypervolemia on hemodialysis due to weight loss from chronic cocaine use. Needs coordination with Cardiology for possible cardiac MRI in  6 weeks.  2. Pleural effusion, ascites: Transudative fluid analysis of pleural effusion-secondary to heart failure and hypervolemia on ESRD. She was discharged on a short course of opiates for chest wall pain in the setting of effusions, cough, recent thoracocentesis. If still having significant problems with abdominal pain and swelling consider therapeutic paracentesis.  3. Weight loss: Possibly from chronic cocaine use or uremia due to frequent HD noncompliance. Cause of large dry weight discrepancy.   Follow-up Appointments:     Follow-up Information    Follow up with [facility] On 03/16/2016.   Why:  @3 :34 for Hospital follow up, medications   Contact information:   [address] [place] [id number]      Discharge Instructions:   Consultations: Treatment Team:  Roney Jaffe, MD [facility],  MD  Procedures Performed:  Ct Abdomen Pelvis W Wo Contrast  03/06/2016  CLINICAL DATA:  Abdominal pain, weakness, and cough. EXAM: CT ABDOMEN AND PELVIS WITHOUT AND WITH CONTRAST TECHNIQUE: Multidetector CT imaging of the abdomen and pelvis was performed following the standard protocol before and following the bolus administration of intravenous contrast. CONTRAST:  135mL OMNIPAQUE IOHEXOL 300 MG/ML  SOLN COMPARISON:  None. FINDINGS: Bilateral pleural effusions with lower lung zone consolidation and atelectasis. Patchy infiltration also in the lungs. Changes likely to represent pneumonia or edema. Diffuse cardiac enlargement. The diffuse abdominal and pelvic free fluid consistent with ascites. Spleen is enlarged. The unenhanced appearance of the liver, gallbladder, pancreas, adrenal glands, kidneys, abdominal aorta, inferior vena cava, and retroperitoneal lymph nodes are unremarkable as visualized. Visualization of solid organs is limited due to ascites and without IV contrast material. Stomach is decompressed. Small bowel and colon are not abnormally distended. Contrast material flows through to the colon without evidence of small bowel obstruction. No free air in the abdomen. Pelvis: There is limited visualization of pelvic organs do to the large amount of ascites. No definite mass or lymphadenopathy identified. Bladder appears to be decompressed. Appendix is normal. No destructive bone lesions. Diffuse edema in the subcutaneous fat. IMPRESSION: Bilateral pleural effusions with lower lung zone consolidation atelectasis is well is patchy infiltration suggesting pneumonia or edema. Diffuse cardiac enlargement. Prominent abdominal and pelvic ascites with diffuse edema throughout the subcutaneous fat. Visualization of internal abdominal/pelvic structures is limited but there is no evidence of bowel obstruction or free air. Electronically Signed   By: Lucienne Capers M.D.   On: 03/06/2016 02:25   Dg Chest 1  View  03/06/2016  CLINICAL DATA:  Status post left thoracentesis.  Cough. EXAM: CHEST 1 VIEW COMPARISON:  03/05/2016 FINDINGS: Cardiomegaly again noted. Decreased left pleural effusion noted without evidence of pneumothorax. Bilateral airspace disease/ consolidation again noted. A small right pleural effusion is again noted. IMPRESSION: Decreased left pleural effusion status post thoracentesis. No evidence of pneumothorax. Bilateral lower lung airspace disease/consolidation and small right pleural effusion again noted Electronically Signed   By: Margarette Canada M.D.   On: 03/06/2016 12:03   Dg Chest 2 View  03/08/2016  CLINICAL DATA:  Cough, shortness of breath for 5 days. EXAM: CHEST  2 VIEW COMPARISON:  03/06/2016 FINDINGS: There is cardiomegaly. Small bilateral pleural effusions, left greater than right. Bibasilar atelectasis or infiltrates, slightly improved since prior study. IMPRESSION: Continued small bilateral pleural effusion and bibasilar opacities, both slightly improved since prior study. Cardiomegaly, stable. Electronically Signed   By: Rolm Baptise M.D.   On: 03/08/2016 13:53   Dg Chest 2 View  03/05/2016  CLINICAL DATA:  Short of breath. Cough. Hemoptysis. Weakness. Mid chest pain. Dialysis patient. EXAM: CHEST  2 VIEW COMPARISON:  03/04/2016 FINDINGS: No significant change from prior study. Bilateral lower lung zone opacity, most prominent on the left. Lung base opacity is consistent with a combination of moderate left and small right effusions with associated atelectasis and/or pneumonia. There is evidence of a component of edema which has improved the last 2 days. No pneumothorax. Bony thorax intact. IMPRESSION: Persistent lower lung zone opacity, left greater than right, consistent combination pleural fluid and atelectasis and/ or pneumonia. There is probable component pulmonary edema which has improved over the last 2 days. No new abnormalities. Electronically Signed   By: Lajean Manes M.D.    On: 03/05/2016 14:40   Dg Chest 2 View  03/04/2016  CLINICAL DATA:  Pulmonary edema. EXAM: CHEST  2 VIEW COMPARISON:  March 03, 2016. FINDINGS: Stable cardiomegaly. No pneumothorax is noted. Bony thorax is unremarkable. Moderate left pleural effusion is noted which is increased in size compared to prior exam, with probable underlying edema or atelectasis. Continued right basilar opacity is noted concerning for mild effusion with associated edema. IMPRESSION: Significantly increased left pleural effusion with probable underlying edema or atelectasis. Continued right basilar opacity is noted concerning for mild effusion with associated edema. Electronically Signed   By: Marijo Conception, M.D.   On: 03/04/2016 12:39   Dg Chest 2 View  03/03/2016  CLINICAL DATA:  Shortness of breath for 2 days, dialysis EXAM: CHEST  2 VIEW COMPARISON:  02/17/2016 FINDINGS: Cardiomegaly again noted. Again noted bilateral central vascular congestion and interstitial prominence bilaterally consistent with worsening pulmonary edema. Stable bilateral pleural effusion with bilateral basilar atelectasis or infiltrate. IMPRESSION: Again noted bilateral central vascular congestion and interstitial prominence bilaterally consistent with worsening pulmonary edema. Stable bilateral pleural effusion with bilateral basilar atelectasis or infiltrate. Electronically Signed   By: Lahoma Crocker M.D.   On: 03/03/2016 10:18   Dg Chest 2 View  02/17/2016  CLINICAL DATA:  Shortness of breath.  Weight loss. EXAM: CHEST  2 VIEW COMPARISON:  11/16/2015. FINDINGS: Mediastinum is stable. Cardiomegaly with pulmonary vascular prominence and diffuse pulmonary interstitial prominence with bilateral pleural effusions again noted. Findings have progressed. Findings are consistent progressive congestive heart failure. IMPRESSION: Interim progression of congestive heart failure with pulmonary enters edema and bilateral pleural effusion Electronically Signed   By:  Walton Hills   On: 02/17/2016 09:00   Dg Abd 1 View  03/05/2016  CLINICAL DATA:  Severe abdominal pain. EXAM: ABDOMEN - 1 VIEW COMPARISON:  None. FINDINGS: Normal bowel gas pattern.  Mild colonic stool burden. Possible margin of the spleen. No evidence of renal or ureteral stones. Soft tissues otherwise unremarkable. Normal skeletal structures. IMPRESSION: 1. No acute findings.  No evidence of bowel obstruction. 2. Possible splenomegaly. Electronically Signed   By: Lajean Manes M.D.   On: 03/05/2016 14:36   US Thoracentesis Asp Pleural Space W/img Guide  03/06/2016  INDICATION: End-stage renal disease patient on dialysis with acute respiratory failure secondary to pneumonia with a left-sided pleural effusion. Request has been made for diagnostic and therapeutic thoracentesis. EXAM: ULTRASOUND GUIDED DIAGNOSTIC AND THERAPEUTIC THORACENTESIS MEDICATIONS: 1% lidocaine COMPLICATIONS: None immediate. PROCEDURE: An ultrasound guided thoracentesis was thoroughly discussed with the patient and questions answered. The benefits, risks, alternatives and complications were also discussed. The patient understands and wishes to proceed with the procedure. Written consent was obtained. Ultrasound was performed to localize and mark an  adequate pocket of fluid in the left chest. The area was then prepped and draped in the normal sterile fashion. 1% Lidocaine was used for local anesthesia. A Safe-T-Centesis catheter was introduced. Thoracentesis was performed. The catheter was removed and a dressing applied. FINDINGS: A total of approximately 0.63 L of serosanguineous fluid was removed. Samples were sent to the laboratory as requested by the clinical team. IMPRESSION: Successful ultrasound guided left thoracentesis yielding 0.63 of pleural fluid. Procedure was stopped secondary to chest tightness and coughing. Read by: Saverio Danker, PA-C Electronically Signed   By: Jacqulynn Cadet M.D.   On: 03/06/2016 12:19    2D  Echo: LV EF: 20% - 25%  ------------------------------------------------------------------- Indications: CHF - 428.0. ------------------------------------------------------------------- History: PMH: Lupus. End stage renal disease. History of pleural effusion. History of pericarditis. Polysubstance abuse. Risk factors: Hypertension. ------------------------------------------------------------------- Study Conclusions  - Left ventricle: The cavity size was normal. Wall thickness was  increased in a pattern of moderate LVH. Systolic function was  severely reduced. The estimated ejection fraction was in the  range of 20% to 25%. Diffuse hypokinesis. Relative increased  echodensity of myocardium could be seen in the setting of ESRD,  although cannot exclude infiltrative cardiomyopathy. There is  also prominent LV trabeculation noted in certain views which  could be seen with a non-compaction syndrome, although this study  is not specifically diagnostic of such. Could consider a cardiac  MRI to investigate these possibilities further. The study is not  technically sufficient to allow evaluation of LV diastolic  function. - Mitral valve: Mildly thickened leaflets . There was moderate to  severe regurgitation. Regurgitant volume (PISA): 52 ml. - Left atrium: The atrium was severely dilated. - Right atrium: Central venous pressure (est): 3 mm Hg. - Tricuspid valve: There was mild regurgitation. - Pulmonary arteries: PA peak pressure: 26 mm Hg (S). - Pericardium, extracardiac: There was a left pleural effusion.  Impressions:  - Moderate LVH with LVEF 20-25% and diffuse hypokinesis. LVEF has  decreased compared to the previous study in 2014. Relative  increased echodensity of myocardium could be seen in the setting  of ESRD, although cannot exclude infiltrative cardiomyopathy.  There is also prominent LV trabeculation noted in certain views  which could be  seen with a non-compaction syndrome, although this  study is not specifically diagnostic of such. Could consider a  cardiac MRI to investigate these possibilities further. Diastolic  function is indeterminant. Severe left atrial enlargement. Mildly  thickened mitral leaflets with moderate to severe mitral  regurgitation (higher end of spectrum). Mild tricuspid  regurgitation with PASP estimated 26 mmHg. Left pleural effusion.  Transthoracic echocardiography. M-mode, complete 2D, spectral Doppler, and color Doppler. Birthdate: Patient birthdate: February 03, 1981. Age: Patient is 35 yr old. Sex: Gender: female. BMI: 16.6 kg/m^2. Blood pressure: 155/87 Patient status: Inpatient. Study date: Study date: 03/08/2016. Study time: 08:39 AM. Location: Bedside. ------------------------------------------------------------------- Left ventricle: The cavity size was normal. Wall thickness was increased in a pattern of moderate LVH. Systolic function was severely reduced. The estimated ejection fraction was in the range of 20% to 25%. Diffuse hypokinesis. Relative increased echodensity of myocardium could be seen in the setting of ESRD, although cannot exclude infiltrative cardiomyopathy. There is also prominent LV trabeculation noted in certain views which could be seen with a non-compaction syndrome, although this study is not specifically diagnostic of such. Could consider a cardiac MRI to investigate these possibilities further. The study is not technically sufficient to allow evaluation of LV diastolic function. ------------------------------------------------------------------- Aortic valve:  Trileaflet. Cusp separation was normal. Doppler: There was no significant regurgitation. ------------------------------------------------------------------- Aorta: Aortic root: The aortic root was normal in  size. ------------------------------------------------------------------- Mitral valve: Mildly thickened leaflets . Doppler: There was moderate to severe regurgitation. Peak gradient (D): 5 mm Hg. ------------------------------------------------------------------- Left atrium: The atrium was severely dilated. ------------------------------------------------------------------- Right ventricle: The cavity size was normal. Systolic function was normal. ------------------------------------------------------------------- Pulmonic valve: The valve appears to be grossly normal. Doppler: There was physiologic regurgitation. ------------------------------------------------------------------- Tricuspid valve: The valve appears to be grossly normal. Doppler: There was mild regurgitation. ------------------------------------------------------------------- Right atrium: The atrium was normal in size. ------------------------------------------------------------------- Systemic veins: Inferior vena cava: The vessel was normal in size. The respirophasic diameter changes were in the normal range (= 50%), consistent with normal central venous pressure. ------------------------------------------------------------------- Pleura: There was a left pleural effusion. 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 Symptoms started on Sunday and progressively worsened until her presentation today. She was previously feeling well and tolerated hemodialysis on Saturday without incident. She reports not taking many of her home medicines as well as eating and drinking more than is recommended or normal  for her over the weekend. She denies a productive cough or chest pain. She is not feeling fevers, chills, nausea has no vomiting or diarrhea.  On arrival to emergency department she is in significant respiratory distress partially improved on BiPAP. Systolic blood pressure as high as 230s and placed on nicardipine drip. Chest x-ray was obtained showing extensive bilateral traits and effusions. Besides hypervolemia metabolic profile is as expected for ESRD patient. Nephrology was consulted for emergent hemodialysis and in her medicine consult is for admission to the hospital for her respiratory failure.  Of note history partially limited to chart review as patient was in some respiratory distress on BiPAP only able to answer yes no  Hospital Course by problem list: Systolic congestive heart failure: She required bipap for oxygenation until hemodialysis with 4 liters volume removed. Symptoms improved greatly but was continued on daily hemodialysis for volume removal. TTE was performed showing newly reduced 20-25% LVEF compared to 60-65% in 2014. Cardiology was consulted with changed for medical optimization and follow up plan for cardiac MRI. She continued with ultrafiltration on dialysis to eventual 16 lbs total weight removal prior to discharge.  Hypertensive emergency: Hypertensive to 230s/120s with pulmonary edema and hypoxia. Improved with dialysis.  Pleural effusion: Repeat chest xray redemonstrated significant effusions and she underwent left sided thoracocentesis on 3/10 with 630ccs removed. Fluid analysis consistent with transudative effusion. Coag negative staph on fluid culture attributed to contaminant in the absence of clinical signs for infection. Repeat chest xray continued to show decreasing effusions.  Discharge Vitals:   BP 111/72 mmHg  Pulse 106  Temp(Src) 97.9 F (36.6 C) (Oral)  Resp 20  Ht 5\' 4"  (1.626 m)  Wt 46.4 kg (102 lb 4.7 oz)  BMI 17.55 kg/m2  SpO2 100%  LMP  10/27/2015  Discharge Labs:  Results for orders placed or performed during the hospital encounter of 03/03/16 (from the past 24 hour(s))  CBC with Differential/Platelet     Status: Abnormal   Collection Time: 03/09/16  8:43 AM  Result Value Ref Range   WBC 4.1 4.0 - 10.5 K/uL   RBC 3.07 (L) 3.87 - 5.11 MIL/uL   Hemoglobin 7.9 (L) 12.0 - 15.0 g/dL  HCT 27.2 (L) 36.0 - 46.0 %   MCV 88.6 78.0 - 100.0 fL   MCH 25.7 (L) 26.0 - 34.0 pg   MCHC 29.0 (L) 30.0 - 36.0 g/dL   RDW 19.0 (H) 11.5 - 15.5 %   Platelets 165 150 - 400 K/uL   Neutrophils Relative % 53 %   Neutro Abs 2.1 1.7 - 7.7 K/uL   Lymphocytes Relative 24 %   Lymphs Abs 1.0 0.7 - 4.0 K/uL   Monocytes Relative 11 %   Monocytes Absolute 0.5 0.1 - 1.0 K/uL   Eosinophils Relative 11 %   Eosinophils Absolute 0.5 0.0 - 0.7 K/uL   Basophils Relative 1 %   Basophils Absolute 0.0 0.0 - 0.1 K/uL  Renal function panel     Status: Abnormal   Collection Time: 03/09/16  8:44 AM  Result Value Ref Range   Sodium 136 135 - 145 mmol/L   Potassium 4.4 3.5 - 5.1 mmol/L   Chloride 100 (L) 101 - 111 mmol/L   CO2 24 22 - 32 mmol/L   Glucose, Bld 83 65 - 99 mg/dL   BUN 41 (H) 6 - 20 mg/dL   Creatinine, Ser 7.09 (H) 0.44 - 1.00 mg/dL   Calcium 7.6 (L) 8.9 - 10.3 mg/dL   Phosphorus 4.3 2.5 - 4.6 mg/dL   Albumin 2.2 (L) 3.5 - 5.0 g/dL   GFR calc non Af Amer 7 (L) >60 mL/min   GFR calc Af Amer 8 (L) >60 mL/min   Anion gap 12 5 - 15    Signed: Collier Salina, MD 03/11/2016, 8:55 AM

## 2016-03-09 NOTE — Progress Notes (Signed)
Subjective: Echocardiogram obtained yesterday showing severe decreased motility and spoke with cardiologist. Still has mild chest tightness and pain that worsens with deep breaths and postural changes. Cough improving. Drowsy during hemodialysis when seen this morning.  Objective: Vital signs in last 24 hours: Filed Vitals:   03/09/16 0930 03/09/16 1000 03/09/16 1030 03/09/16 1100  BP: 114/96 115/81 116/94 105/77  Pulse: 95 101 100 102  Temp:      TempSrc:      Resp:      Height:      Weight:      SpO2:       Weight change: 0.7 kg (1 lb 8.7 oz)  Intake/Output Summary (Last 24 hours) at 03/09/16 1129 Last data filed at 03/09/16 0743  Gross per 24 hour  Intake    420 ml  Output      0 ml  Net    420 ml   GENERAL- Thin chronically ill appearing woman, co-operative, NAD CARDIAC- RRR, no murmurs, rubs or gallops. RESP- rhonchorus sounds over RLL, good air movement in other fields ABDOMEN- Nontender, nondistended EXTREMITIES- symmetric, no pedal edema, LUE AVF in use on HD PSYCH- Normal mood and affect, appropriate thought content and speech.  Lab Results: Basic Metabolic Panel:  Recent Labs Lab 03/07/16 0715 03/09/16 0844  NA 139 136  K 3.5 4.4  CL 98* 100*  CO2 28 24  GLUCOSE 78 83  BUN 18 41*  CREATININE 4.29* 7.09*  CALCIUM 7.7* 7.6*  PHOS 3.6 4.3   Liver Function Tests:  Recent Labs Lab 03/06/16 1817 03/07/16 0715 03/09/16 0844  AST 30  --   --   ALT 22  --   --   ALKPHOS 84  --   --   BILITOT 0.5  --   --   PROT 8.3*  --   --   ALBUMIN 2.6* 2.1* 2.2*   CBC:  Recent Labs Lab 03/07/16 0715 03/09/16 0843  WBC 4.3 4.1  NEUTROABS  --  2.1  HGB 7.4* 7.9*  HCT 24.4* 27.2*  MCV 90.0 88.6  PLT 137* 165   Cardiac Enzymes:  Recent Labs Lab 03/03/16 0940  TROPONINI 0.06*   Urine Drug Screen: Drugs of Abuse     Component Value Date/Time   LABOPIA NONE DETECTED 03/04/2016 0359   COCAINSCRNUR POSITIVE* 03/04/2016 0359   LABBENZ NONE  DETECTED 03/04/2016 0359   AMPHETMU NONE DETECTED 03/04/2016 0359   THCU NONE DETECTED 03/04/2016 0359   LABBARB NONE DETECTED 03/04/2016 0359    Micro Results: Recent Results (from the past 240 hour(s))  MRSA PCR Screening     Status: None   Collection Time: 03/04/16 12:37 AM  Result Value Ref Range Status   MRSA by PCR NEGATIVE NEGATIVE Final    Comment:        The GeneXpert MRSA Assay (FDA approved for NASAL specimens only), is one component of a comprehensive MRSA colonization surveillance program. It is not intended to diagnose MRSA infection nor to guide or monitor treatment for MRSA infections.   Culture, blood (Routine X 2) w Reflex to ID Panel     Status: None (Preliminary result)   Collection Time: 03/05/16  8:30 AM  Result Value Ref Range Status   Specimen Description BLOOD HEMODIALYSIS FISTULA  Final   Special Requests BOTTLES DRAWN AEROBIC AND ANAEROBIC 10CC  Final   Culture NO GROWTH 4 DAYS  Final   Report Status PENDING  Incomplete  Culture, blood (Routine X 2)  w Reflex to ID Panel     Status: None (Preliminary result)   Collection Time: 03/05/16  9:00 AM  Result Value Ref Range Status   Specimen Description BLOOD HEMODIALYSIS FISTULA  Final   Special Requests BOTTLES DRAWN AEROBIC AND ANAEROBIC 10CC  Final   Culture NO GROWTH 4 DAYS  Final   Report Status PENDING  Incomplete  Culture, body fluid-bottle     Status: None   Collection Time: 03/06/16 10:59 AM  Result Value Ref Range Status   Specimen Description FLUID PLEURAL LEFT  Final   Special Requests NONE  Final   Gram Stain   Final    GRAM POSITIVE COCCI IN CLUSTERS AEROBIC BOTTLE ONLY CRITICAL RESULT CALLED TO, READ BACK BY AND VERIFIED WITH: S CALWINTON 03/07/16 @ 0922 M VESTAL    Culture STAPHYLOCOCCUS SPECIES (COAGULASE NEGATIVE)  Final   Report Status 03/09/2016 FINAL  Final   Organism ID, Bacteria STAPHYLOCOCCUS SPECIES (COAGULASE NEGATIVE)  Final      Susceptibility   Staphylococcus species  (coagulase negative) - MIC*    CIPROFLOXACIN <=0.5 SENSITIVE Sensitive     ERYTHROMYCIN >=8 RESISTANT Resistant     GENTAMICIN <=0.5 SENSITIVE Sensitive     OXACILLIN <=0.25 RESISTANT Resistant     TETRACYCLINE >=16 RESISTANT Resistant     VANCOMYCIN <=0.5 SENSITIVE Sensitive     TRIMETH/SULFA 80 RESISTANT Resistant     CLINDAMYCIN >=8 RESISTANT Resistant     RIFAMPIN <=0.5 SENSITIVE Sensitive     Inducible Clindamycin NEGATIVE Sensitive     * STAPHYLOCOCCUS SPECIES (COAGULASE NEGATIVE)  Gram stain     Status: None   Collection Time: 03/06/16 10:59 AM  Result Value Ref Range Status   Specimen Description FLUID PLEURAL LEFT  Final   Special Requests NONE  Final   Gram Stain NO WBC SEEN NO ORGANISMS SEEN   Final   Report Status 03/06/2016 FINAL  Final   Studies/Results:  Medications: I have reviewed the patient's current medications. Scheduled Meds: . calcitRIOL  1.5 mcg Oral Q T,Th,Sa-HD  . calcium acetate  2,001 mg Oral TID WC  . carvedilol  3.125 mg Oral BID WC  . cinacalcet  30 mg Oral Q supper  . dextromethorphan-guaiFENesin  1 tablet Oral BID  . feeding supplement (NEPRO CARB STEADY)  237 mL Oral BID BM  . ferric gluconate (FERRLECIT/NULECIT) IV  62.5 mg Intravenous Q Thu-HD  . heparin  5,000 Units Subcutaneous 3 times per day  . hydrALAZINE  25 mg Oral 3 times per day  . hydroxychloroquine  400 mg Oral Daily  . isosorbide mononitrate  30 mg Oral Daily  . multivitamin  1 tablet Oral Daily   Continuous Infusions:  PRN Meds:.sodium chloride, sodium chloride, albuterol, alteplase, gi cocktail, heparin, lidocaine (PF), lidocaine-prilocaine, oxyCODONE-acetaminophen, pentafluoroprop-tetrafluoroeth, temazepam Assessment/Plan: Acute severe systolic congestive heart failure LVEF 20-25%: Down from normal function seen on TTE in 2014. Most likely related to chronic cocaine abuse but also could be mediated by lupus (Hx of pericardial effusion), and ESRD on HD with inadequate blood  pressure control. -coreg 3.125mg  BID -hydralazine 25mg  q8hrs -Imdur 30mg  -Cardiology recommendations appreciated  Hypervolemia, with pleural effusions, ascites: Improved air movement compared to yesterday clinically corresponds with radiography for decreasing effusions.. Cough improving still has some pleuritic chest pain. -HD with UF today  ESRD on HD: Continue HD per nephrology. -Nephrology consulted, recs appreciated -MBD, AOCD management per nephrology  Acute on chronic anemia: Hgb stable in 7s. Unclear change from 11.7 noted on  admission, no clinical evidence of bleeding no blood on imaging.  Anxiety, insomnia: On restaril PTA, will resume qhs PRN. SLE: Stable. Plaquenil 400mg   Diet: Renal VTE ppx: Heparin FULL CODE  Dispo: Probable discharge to home later today if doing well after hemodialysis.  The patient does have a current PCP Lucious Groves, DO) and does need an [facility] hospital follow-up appointment after discharge.  The patient does not know have transportation limitations that hinder transportation to clinic appointments.   LOS: 6 days   Collier Salina, MD 03/09/2016, 11:29 AM

## 2016-03-09 NOTE — Progress Notes (Signed)
Discharge instructions and medications discussed with patient.  Prescription given to patient.  All questions answered.  

## 2016-03-09 NOTE — Discharge Instructions (Signed)
You were admitted with a large fluid buildup on your lungs and abdomen. This is related to needing more fluid removed on hemodialysis and with decreased function in your heart.   You will need to take all your medications as prescribed to help maximize your heart function. This should decrease the shortness of breath, cough, and chest pain you were having before.  [facility] 1 week on 3/20 at 3:45pm. Please call the clinic sooner if you find your symptoms worsening before that appointment.

## 2016-03-10 LAB — CULTURE, BLOOD (ROUTINE X 2)
CULTURE: NO GROWTH
CULTURE: NO GROWTH

## 2016-03-13 ENCOUNTER — Telehealth: Payer: Self-pay | Admitting: Internal Medicine

## 2016-03-13 NOTE — Telephone Encounter (Signed)
APPT. REMINDER CALL, LMTCB °

## 2016-03-16 ENCOUNTER — Ambulatory Visit (INDEPENDENT_AMBULATORY_CARE_PROVIDER_SITE_OTHER): Payer: Medicaid Other | Admitting: Internal Medicine

## 2016-03-16 ENCOUNTER — Encounter: Payer: Self-pay | Admitting: Internal Medicine

## 2016-03-16 VITALS — BP 126/81 | HR 61 | Temp 97.7°F | Ht 64.0 in | Wt 104.9 lb

## 2016-03-16 DIAGNOSIS — I5022 Chronic systolic (congestive) heart failure: Secondary | ICD-10-CM

## 2016-03-16 DIAGNOSIS — Z720 Tobacco use: Secondary | ICD-10-CM

## 2016-03-16 DIAGNOSIS — N186 End stage renal disease: Secondary | ICD-10-CM | POA: Diagnosis not present

## 2016-03-16 DIAGNOSIS — F1421 Cocaine dependence, in remission: Secondary | ICD-10-CM

## 2016-03-16 DIAGNOSIS — I5042 Chronic combined systolic (congestive) and diastolic (congestive) heart failure: Secondary | ICD-10-CM | POA: Insufficient documentation

## 2016-03-16 DIAGNOSIS — Z992 Dependence on renal dialysis: Secondary | ICD-10-CM | POA: Diagnosis not present

## 2016-03-16 DIAGNOSIS — I5043 Acute on chronic combined systolic (congestive) and diastolic (congestive) heart failure: Secondary | ICD-10-CM | POA: Insufficient documentation

## 2016-03-16 HISTORY — DX: Chronic systolic (congestive) heart failure: I50.22

## 2016-03-16 NOTE — Assessment & Plan Note (Addendum)
A: Patient has worsening systolic function based on comparing TTEs from 2014 and 2017. Probably etiology is continued cocaine use in setting of uncontrolled HTN and SLE. Patient was counseled on cocaine's deleterious effects on the heart and that she is shortening her life if she uses it again. Her symptoms seem stable since she was discharged a week ago, and it is unclear how much they will improve, even with maximum medical management. Fortunately, initial SpO2 readings in the 70% range were found to be erroneous, consistent with her physical exam of being in no respiratory distress, with no cyanosis and not using accessory muscles. She has Cardiac MRI to be performed in approximately 6 weeks.   On the way out of the office, she requested oxycodone for "chest tightness." I had previously asked multiple times if she needed any refills, and had only mentioned Sensipar, which is managed by her nephrologist. She appeared visibly upset that I would not provide her this prescription. I believe the timing of her request was a deliberate attempt to pressure me into providing oxycodone.  P: Continue carvedilol, hydralazine, and isosorbide mononitrate. Cardiology does not recommend ACEi or ARB in setting HD

## 2016-03-16 NOTE — Patient Instructions (Signed)
Ms. Cieslinski, it was a pleasure seeing you today.  You sensation of chest tightness is likely related to your heart problems. Please continue to take your heart medications to prevent this from worsening.  Please continue to go to dialysis 3 times a week. This can help with shortness of breath.  Please follow up with you eye doctor in about 3-4 months for an eye exam.

## 2016-03-16 NOTE — Progress Notes (Signed)
   Subjective:    Patient ID: [name], female    DOB: 1981-10-01, 35 y.o.   MRN: [id number]  HPI  Ms. 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 She denies any significant changes in dyspnea, chest tightness, or leg swelling that she has felt since discharge on 3/13. She has been faithfully going to her TTS dialysis sessions which she says are helpful for her dyspnea. She says she has not had any cocaine, despite repeated temptations.  During the encounter, SpO2 was repeatedly yet erroneously read at in the 70-80% range despite no immediate complaints in dyspnea. After using several pulse ox devices, we found that adhesive based pulse ox probes were effective, and she was satting 95-100% on room air with these probes. After 30 minutes of trying to obtain an accurate pulse ox reading, the patient said that this is an persistent problem for providers and they eventually find out that the adhesive probes work better on her.   The patient requests Sensipar today, but over time understood that this medication was managed by her nephrologists. After repeated inquiries as to whether she needed any other refills, she declined. However, after we left the room together, she asked for an oxycodone prescription for her "chest tightness." The patient, who was not in any apparent distress, said this was the only medication that would work for her.     Review of Systems  Constitutional: Negative for fever, chills and fatigue.  Respiratory: Positive for chest tightness. Negative for wheezing.        Dyspnea after walking long periods.   Cardiovascular: Negative for chest pain, palpitations and leg swelling.  Gastrointestinal: Negative for abdominal pain and diarrhea.  Genitourinary: Negative for dysuria and flank pain.       Patient still  produces urine once per day  Neurological: Negative for dizziness and headaches.  Psychiatric/Behavioral: Negative for dysphoric mood. The patient is not nervous/anxious.        Objective:   Physical Exam  Constitutional: She appears well-developed and well-nourished. No distress.  Thin, chronically ill appearing  HENT:  Mouth/Throat: Oropharynx is clear and moist. No oropharyngeal exudate.  Eyes: EOM are normal. Pupils are equal, round, and reactive to light. No scleral icterus.  Cardiovascular: Normal rate.   Continuous murmur. Palpable thrill.  Pulmonary/Chest: Effort normal. No respiratory distress. She has no wheezes.  Not using accessory muscles. Fine bibasilar crackles  Abdominal: Soft. Bowel sounds are normal. She exhibits no distension. There is no tenderness.  Musculoskeletal: She exhibits no edema or tenderness.  Neurological: She is alert. She has normal reflexes.  Skin: Skin is dry. She is not diaphoretic.  Slightly cold extremities  Psychiatric: She has a normal mood and affect. Her behavior is normal.  Vitals reviewed.         Assessment & Plan:  Please see problem based assessment and plan for details.

## 2016-03-17 NOTE — Progress Notes (Signed)
[facility] Attending  I saw and evaluated the patient.  I personally confirmed the key portions of the history and exam documented by Dr. Marijean Bravo and I reviewed pertinent patient test results.  The assessment, diagnosis, and plan were formulated together and I agree with the documentation in the resident's note. I observed Dr Barbera Setters monitoring of Ms Steeley' pulse ox and agree that the low O2 Sats were erroneous. When there was a good waveform, she was in mid to high 90's. Has HD on the 21st and she is stable to return home and attend HD. She has a reliable ride.

## 2016-04-18 DIAGNOSIS — R197 Diarrhea, unspecified: Secondary | ICD-10-CM | POA: Insufficient documentation

## 2016-06-16 ENCOUNTER — Encounter: Payer: Self-pay | Admitting: *Deleted

## 2016-12-03 DIAGNOSIS — I429 Cardiomyopathy, unspecified: Secondary | ICD-10-CM | POA: Insufficient documentation

## 2017-01-29 ENCOUNTER — Telehealth: Payer: Self-pay | Admitting: *Deleted

## 2017-01-29 NOTE — Telephone Encounter (Signed)
Amlodipine stopped at disch 02/2016, denied to pharm, pt needs appt asap

## 2017-02-03 NOTE — Telephone Encounter (Signed)
Attempted to contact patient today phone rings once and then busy signal comes on.  Had Doris attempt to contact patient also and the same happened when she called.  Sending pt an appt in the mail for next Friday 02-12-17 @ 1:15 PM.

## 2017-02-12 ENCOUNTER — Ambulatory Visit: Payer: Medicaid Other

## 2017-02-12 ENCOUNTER — Encounter: Payer: Self-pay | Admitting: Internal Medicine

## 2017-02-16 ENCOUNTER — Encounter (HOSPITAL_COMMUNITY): Payer: Self-pay | Admitting: *Deleted

## 2017-02-16 ENCOUNTER — Emergency Department (HOSPITAL_COMMUNITY): Payer: Medicaid Other

## 2017-02-16 ENCOUNTER — Inpatient Hospital Stay (HOSPITAL_COMMUNITY)
Admission: EM | Admit: 2017-02-16 | Discharge: 2017-02-18 | DRG: 291 | Disposition: A | Discharge status: Home / Self Care | Payer: Medicaid Other | Attending: Oncology | Admitting: Oncology

## 2017-02-16 DIAGNOSIS — I5023 Acute on chronic systolic (congestive) heart failure: Secondary | ICD-10-CM | POA: Diagnosis present

## 2017-02-16 DIAGNOSIS — M3214 Glomerular disease in systemic lupus erythematosus: Secondary | ICD-10-CM | POA: Diagnosis present

## 2017-02-16 DIAGNOSIS — J811 Chronic pulmonary edema: Secondary | ICD-10-CM | POA: Diagnosis present

## 2017-02-16 DIAGNOSIS — Z8249 Family history of ischemic heart disease and other diseases of the circulatory system: Secondary | ICD-10-CM

## 2017-02-16 DIAGNOSIS — D72819 Decreased white blood cell count, unspecified: Secondary | ICD-10-CM | POA: Diagnosis present

## 2017-02-16 DIAGNOSIS — I248 Other forms of acute ischemic heart disease: Secondary | ICD-10-CM | POA: Diagnosis present

## 2017-02-16 DIAGNOSIS — N2581 Secondary hyperparathyroidism of renal origin: Secondary | ICD-10-CM | POA: Diagnosis present

## 2017-02-16 DIAGNOSIS — N186 End stage renal disease: Secondary | ICD-10-CM | POA: Diagnosis not present

## 2017-02-16 DIAGNOSIS — M329 Systemic lupus erythematosus, unspecified: Secondary | ICD-10-CM | POA: Diagnosis present

## 2017-02-16 DIAGNOSIS — F1290 Cannabis use, unspecified, uncomplicated: Secondary | ICD-10-CM | POA: Diagnosis present

## 2017-02-16 DIAGNOSIS — G4700 Insomnia, unspecified: Secondary | ICD-10-CM

## 2017-02-16 DIAGNOSIS — E8889 Other specified metabolic disorders: Secondary | ICD-10-CM | POA: Diagnosis present

## 2017-02-16 DIAGNOSIS — Z8269 Family history of other diseases of the musculoskeletal system and connective tissue: Secondary | ICD-10-CM | POA: Diagnosis not present

## 2017-02-16 DIAGNOSIS — I132 Hypertensive heart and chronic kidney disease with heart failure and with stage 5 chronic kidney disease, or end stage renal disease: Secondary | ICD-10-CM | POA: Diagnosis not present

## 2017-02-16 DIAGNOSIS — J90 Pleural effusion, not elsewhere classified: Secondary | ICD-10-CM | POA: Diagnosis not present

## 2017-02-16 DIAGNOSIS — Z882 Allergy status to sulfonamides status: Secondary | ICD-10-CM | POA: Diagnosis not present

## 2017-02-16 DIAGNOSIS — F17210 Nicotine dependence, cigarettes, uncomplicated: Secondary | ICD-10-CM | POA: Diagnosis present

## 2017-02-16 DIAGNOSIS — I5022 Chronic systolic (congestive) heart failure: Secondary | ICD-10-CM | POA: Diagnosis not present

## 2017-02-16 DIAGNOSIS — I5042 Chronic combined systolic (congestive) and diastolic (congestive) heart failure: Secondary | ICD-10-CM | POA: Diagnosis present

## 2017-02-16 DIAGNOSIS — Z841 Family history of disorders of kidney and ureter: Secondary | ICD-10-CM

## 2017-02-16 DIAGNOSIS — R0602 Shortness of breath: Secondary | ICD-10-CM | POA: Diagnosis present

## 2017-02-16 DIAGNOSIS — Z9115 Patient's noncompliance with renal dialysis: Secondary | ICD-10-CM

## 2017-02-16 DIAGNOSIS — Z79899 Other long term (current) drug therapy: Secondary | ICD-10-CM | POA: Diagnosis not present

## 2017-02-16 DIAGNOSIS — I5043 Acute on chronic combined systolic (congestive) and diastolic (congestive) heart failure: Secondary | ICD-10-CM | POA: Diagnosis present

## 2017-02-16 DIAGNOSIS — D509 Iron deficiency anemia, unspecified: Secondary | ICD-10-CM | POA: Diagnosis present

## 2017-02-16 DIAGNOSIS — E8779 Other fluid overload: Secondary | ICD-10-CM | POA: Diagnosis not present

## 2017-02-16 DIAGNOSIS — Z9114 Patient's other noncompliance with medication regimen: Secondary | ICD-10-CM

## 2017-02-16 DIAGNOSIS — Z881 Allergy status to other antibiotic agents status: Secondary | ICD-10-CM

## 2017-02-16 DIAGNOSIS — R7989 Other specified abnormal findings of blood chemistry: Secondary | ICD-10-CM | POA: Diagnosis present

## 2017-02-16 DIAGNOSIS — Z992 Dependence on renal dialysis: Secondary | ICD-10-CM

## 2017-02-16 DIAGNOSIS — I340 Nonrheumatic mitral (valve) insufficiency: Secondary | ICD-10-CM | POA: Diagnosis present

## 2017-02-16 DIAGNOSIS — Z801 Family history of malignant neoplasm of trachea, bronchus and lung: Secondary | ICD-10-CM

## 2017-02-16 DIAGNOSIS — Z8349 Family history of other endocrine, nutritional and metabolic diseases: Secondary | ICD-10-CM

## 2017-02-16 DIAGNOSIS — R000 Tachycardia, unspecified: Secondary | ICD-10-CM | POA: Diagnosis present

## 2017-02-16 LAB — BASIC METABOLIC PANEL
Anion gap: 12 (ref 5–15)
BUN: 31 mg/dL — AB (ref 6–20)
CHLORIDE: 94 mmol/L — AB (ref 101–111)
CO2: 30 mmol/L (ref 22–32)
Calcium: 8.5 mg/dL — ABNORMAL LOW (ref 8.9–10.3)
Creatinine, Ser: 7.01 mg/dL — ABNORMAL HIGH (ref 0.44–1.00)
GFR calc non Af Amer: 7 mL/min — ABNORMAL LOW (ref >60)
GFR, EST AFRICAN AMERICAN: 8 mL/min — AB (ref >60)
Glucose, Bld: 89 mg/dL (ref 65–99)
POTASSIUM: 3.6 mmol/L (ref 3.5–5.1)
SODIUM: 136 mmol/L (ref 135–145)

## 2017-02-16 LAB — I-STAT CG4 LACTIC ACID, ED: Lactic Acid, Venous: 1.53 mmol/L (ref 0.5–1.9)

## 2017-02-16 LAB — CBC
HCT: 31.6 % — ABNORMAL LOW (ref 36.0–46.0)
HEMOGLOBIN: 9.8 g/dL — AB (ref 12.0–15.0)
MCH: 27.1 pg (ref 26.0–34.0)
MCHC: 31 g/dL (ref 30.0–36.0)
MCV: 87.5 fL (ref 78.0–100.0)
Platelets: 173 10*3/uL (ref 150–400)
RBC: 3.61 MIL/uL — AB (ref 3.87–5.11)
RDW: 17.5 % — ABNORMAL HIGH (ref 11.5–15.5)
WBC: 3.7 10*3/uL — AB (ref 4.0–10.5)

## 2017-02-16 LAB — I-STAT TROPONIN, ED: Troponin i, poc: 0.12 ng/mL (ref 0.00–0.08)

## 2017-02-16 LAB — BRAIN NATRIURETIC PEPTIDE: B Natriuretic Peptide: 3431.2 pg/mL — ABNORMAL HIGH (ref 0.0–100.0)

## 2017-02-16 MED ORDER — HEPARIN SODIUM (PORCINE) 5000 UNIT/ML IJ SOLN
5000.0000 [IU] | Freq: Three times a day (TID) | INTRAMUSCULAR | Status: DC
  Administered 2017-02-16 – 2017-02-18 (×4): 5000 [IU] via SUBCUTANEOUS
  Filled 2017-02-16 (×4): qty 1

## 2017-02-16 MED ORDER — TEMAZEPAM 15 MG PO CAPS
15.0000 mg | ORAL_CAPSULE | Freq: Every day | ORAL | Status: DC
  Administered 2017-02-16 – 2017-02-17 (×2): 15 mg via ORAL
  Filled 2017-02-16 (×2): qty 1

## 2017-02-16 MED ORDER — CINACALCET HCL 30 MG PO TABS
30.0000 mg | ORAL_TABLET | Freq: Every day | ORAL | Status: DC
  Administered 2017-02-17 – 2017-02-18 (×2): 30 mg via ORAL
  Filled 2017-02-16 (×2): qty 1

## 2017-02-16 MED ORDER — HYDROXYCHLOROQUINE SULFATE 200 MG PO TABS
400.0000 mg | ORAL_TABLET | Freq: Every day | ORAL | Status: DC
Start: 2017-02-16 — End: 2017-02-18
  Administered 2017-02-17 – 2017-02-18 (×2): 400 mg via ORAL
  Filled 2017-02-16 (×3): qty 2

## 2017-02-16 MED ORDER — ALBUTEROL SULFATE (2.5 MG/3ML) 0.083% IN NEBU
3.0000 mL | INHALATION_SOLUTION | Freq: Four times a day (QID) | RESPIRATORY_TRACT | Status: DC | PRN
  Administered 2017-02-16: 3 mL via RESPIRATORY_TRACT
  Filled 2017-02-16: qty 3

## 2017-02-16 MED ORDER — HYDRALAZINE HCL 25 MG PO TABS
25.0000 mg | ORAL_TABLET | Freq: Three times a day (TID) | ORAL | Status: DC
  Administered 2017-02-16 – 2017-02-18 (×4): 25 mg via ORAL
  Filled 2017-02-16 (×4): qty 1

## 2017-02-16 MED ORDER — DEXTROSE 5 % IV SOLN
500.0000 mg | Freq: Once | INTRAVENOUS | Status: DC
  Filled 2017-02-16: qty 500

## 2017-02-16 MED ORDER — DEXTROSE 5 % IV SOLN
1.0000 g | Freq: Once | INTRAVENOUS | Status: AC
  Administered 2017-02-16: 1 g via INTRAVENOUS
  Filled 2017-02-16: qty 10

## 2017-02-16 MED ORDER — ISOSORBIDE MONONITRATE ER 30 MG PO TB24
30.0000 mg | ORAL_TABLET | Freq: Every day | ORAL | Status: DC
  Administered 2017-02-17 – 2017-02-18 (×2): 30 mg via ORAL
  Filled 2017-02-16 (×2): qty 1

## 2017-02-16 NOTE — ED Provider Notes (Signed)
[facility] DEPT Provider Note   CSN: [id number] Arrival date & time: 02/16/17  1226     History   Chief Complaint Chief Complaint  Patient presents with  . Cough  . Shortness of Breath    HPI [name] is a 36 y.o. female.  HPI  36 year old female presents today with complaints of shortness of breath.  Patient is a dialysis patient, she goes Tuesday Thursday Saturday.  Patient notes she had shortness of breath that started on Sunday (2 days ago).  She notes at baseline she feels all right but when she ambulates or even moves around in bed she gets significantly short of breath.  She has noted a cough for the last month with worsening production, getting up different colored phlegm.  She notes generalized fatigue.  She denies any lower extremity swelling or edema.  Patient denies any fever chills.  She denies any chest pain at baseline, coughing causes slight chest pain.  Patient has been using an inhaler at home with no significant improvement in her symptoms.  She does note cocaine use approximately 1 week ago.  Patient notes that she has not been taking her home medicines regularly, and has run out of most of them.   Past Medical History:  Diagnosis Date  . Benign hypertension with ESRD (end-stage renal disease) ([facility]) 03/15/2014  . Chronic systolic congestive heart failure ([facility]) 03/16/2016  . H/O pericarditis 01/17/2013  . H/O pleural effusion 01/17/2013  . Lupus (systemic lupus erythematosus) ([facility])    Previously followed with Dr. Charlestine Night, has not followed up recently  . 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 Polysubstance abuse    cocaine, MJ, tobacco  . S/P pericardiocentesis 01/17/2013   H/o pericardial effusion with tamponade 2006     Patient Active Problem List   Diagnosis Date Noted  . SOB (shortness of breath)  02/16/2017  . Chronic systolic congestive heart failure ([facility]) 03/16/2016  . Cough   . Ascites   . Follow up   . Other hypervolemia   . S/P thoracentesis   . Abdominal pain   . Pleural effusion   . Hemoptysis   . Myalgia   . Pulmonary edema   . Crack cocaine overdose   . ESRD (end stage renal disease) ([facility]) 03/03/2016  . Hypertensive emergency 03/03/2016  . Acute bronchitis 02/03/2016  . Volume overload 11/16/2015  . Shortness of breath 11/16/2015  . Acute UTI   . Hyperkalemia 09/25/2015  . Rash and nonspecific skin eruption 06/26/2015  . Secondary Raynaud's phenomenon 06/24/2015  . Cramping of hands 04/19/2014  . Unspecified contraceptive management 04/19/2014  . End stage renal disease ([facility] [place]) 03/27/2014  . Insomnia 03/15/2014  . Benign hypertension with ESRD (end-stage renal disease) ([facility]) 03/15/2014  . Tobacco abuse 02/15/2014  . Healthcare maintenance 02/15/2014  . Hypoalbuminemia 02/01/2014  . Nephrotic syndrome 02/01/2014  . ESRD on dialysis ([facility]) 01/31/2014  . Pleural effusion, left 01/31/2014  . Metabolic acidosis, increased anion gap (IAG) 01/31/2014  . Microcytic anemia 01/29/2014  . Hypocalcemia 01/29/2014  . Streptococcal bacteremia 01/23/2013  . Cocaine abuse 01/18/2013  . Marijuana smoker ([facility]) 01/18/2013  . H/O pericarditis 01/17/2013  . SLE (systemic lupus erythematosus) ([facility]) 01/17/2013  . Lupus nephritis ([facility]) 01/17/2013  . S/P pericardiocentesis 01/17/2013  . H/O pleural effusion 01/17/2013  . Nephrosis 01/17/2013  . Trichomoniasis 01/17/2013  . Preseptal cellulitis  01/17/2013    Past Surgical History:  Procedure Laterality Date  . BASCILIC VEIN TRANSPOSITION Left 02/05/2014   Procedure: [place];  Surgeon: [name], MD;  Location: [facility];  Service: Vascular;  Laterality: Left;  Marland Kitchen VENOGRAM Right 01/31/2014   Procedure: DIALYSIS CATHETER;  Surgeon: Serafina Mitchell, MD;  Location: [facility] CATH LAB;  Service: Cardiovascular;   Laterality: Right;    OB History    No data available       Home Medications    Prior to Admission medications   Medication Sig Start Date End Date Taking? Authorizing Provider  albuterol (PROVENTIL HFA;VENTOLIN HFA) 108 (90 Base) MCG/ACT inhaler Inhale 1-2 puffs into the lungs every 6 (six) hours as needed for wheezing or shortness of breath. 02/03/16  Yes Lucious Groves, DO  calcium acetate (PHOSLO) 667 MG capsule Take 2,001-4,002 mg by mouth See admin instructions. 4,002 mg three times a day with each meal and 2,001 mg with each snack   Yes Historical Provider, MD  Cholecalciferol (VITAMIN D-3 PO) Take 1 capsule by mouth See admin instructions. Every Tues/Thurs/Sat at dialysis   Yes Historical Provider, MD  cinacalcet (SENSIPAR) 30 MG tablet Take 30 mg by mouth daily.   Yes Historical Provider, MD  darbepoetin (ARANESP) 60 MCG/0.3ML SOLN injection Inject 0.3 mLs (60 mcg total) into the vein every Wednesday with hemodialysis. Patient taking differently: Inject 60 mcg into the vein every 7 (seven) days.  02/08/14  Yes Jessee Avers, MD  ferric gluconate 62.5 mg in sodium chloride 0.9 % 100 mL Inject 62.5 mg into the vein every Thursday with hemodialysis. 03/09/16  Yes Collier Salina, MD  Nutritional Supplements (FEEDING SUPPLEMENT, NEPRO CARB STEADY,) LIQD Take 237 mLs by mouth See admin instructions. At every dialysis treatment on Tues/Thurs/Sat   Yes Historical Provider, MD  UNABLE TO FIND Cornstarch: Eats 7-8 tablespoonsful daily   Yes Historical Provider, MD  carvedilol (COREG) 3.125 MG tablet Take 1 tablet (3.125 mg total) by mouth 2 (two) times daily with a meal. Patient not taking: Reported on 02/16/2017 03/09/16   Collier Salina, MD  hydrALAZINE (APRESOLINE) 25 MG tablet Take 1 tablet (25 mg total) by mouth 3 (three) times daily. Patient not taking: Reported on 02/16/2017 03/09/16   Collier Salina, MD  hydroxychloroquine (PLAQUENIL) 200 MG tablet Take 2 tablets (400 mg total)  by mouth daily. Patient not taking: Reported on 02/16/2017 06/24/15   Lucious Groves, DO  isosorbide mononitrate (IMDUR) 30 MG 24 hr tablet Take 1 tablet (30 mg total) by mouth daily. Patient not taking: Reported on 02/16/2017 03/09/16   Collier Salina, MD  multivitamin (RENA-VIT) TABS tablet Take 1 tablet by mouth daily.    Historical Provider, MD  ondansetron (ZOFRAN-ODT) 4 MG disintegrating tablet Take 1 tablet (4 mg total) by mouth every 8 (eight) hours as needed for nausea or vomiting. Patient not taking: Reported on 02/16/2017 03/09/16   Collier Salina, MD  oxyCODONE-acetaminophen (PERCOCET/ROXICET) 5-325 MG tablet Take 1 tablet by mouth every 8 (eight) hours as needed for moderate pain or severe pain. Patient not taking: Reported on 02/16/2017 03/09/16   Collier Salina, MD  temazepam (RESTORIL) 15 MG capsule Take 15 mg by mouth at bedtime. 02/28/16   Historical Provider, MD  triamcinolone cream (KENALOG) 0.1 % Apply 1 application topically 2 (two) times daily. Patient not taking: Reported on 02/16/2017 06/24/15   Lucious Groves, DO    Family History History reviewed. No  pertinent family history.  Social History Social History  Substance Use Topics  . Smoking status: Current Some Day Smoker    Packs/day: 0.25    Years: 15.00  . Smokeless tobacco: Never Used     Comment: quit smoking s/p hospital discharge/ SMOKES ABOUT 2-3 CIGARETTES A DAY  . Alcohol use 0.0 oz/week     Comment: occ     Allergies   Tobramycin sulfate   Review of Systems Review of Systems  All other systems reviewed and are negative.    Physical Exam Updated Vital Signs BP (!) 144/119   Pulse (!) 55   Temp 100 F (37.8 C) (Rectal)   Resp 26   Ht 5\' 5"  (1.651 m)   Wt 52.2 kg   SpO2 95%   BMI 19.15 kg/m   Physical Exam  Constitutional: She is oriented to person, place, and time. She appears well-developed and well-nourished.  HENT:  Head: Normocephalic and atraumatic.  Eyes: Conjunctivae  are normal. Pupils are equal, round, and reactive to light. Right eye exhibits no discharge. Left eye exhibits no discharge. No scleral icterus.  Neck: Normal range of motion. No JVD present. No tracheal deviation present.  Cardiovascular:  Murmur heard. Systolic murmur   Pulmonary/Chest: Effort normal. No stridor. No respiratory distress.  Bilateral crackles   Abdominal: Soft.  Musculoskeletal: She exhibits no edema.  Neurological: She is alert and oriented to person, place, and time. Coordination normal.  Psychiatric: She has a normal mood and affect. Her behavior is normal. Judgment and thought content normal.  Nursing note and vitals reviewed.    ED Treatments / Results  Labs (all labs ordered are listed, but only abnormal results are displayed) Labs Reviewed  BASIC METABOLIC PANEL - Abnormal; Notable for the following:       Result Value   Chloride 94 (*)    BUN 31 (*)    Creatinine, Ser 7.01 (*)    Calcium 8.5 (*)    GFR calc non Af Amer 7 (*)    GFR calc Af Amer 8 (*)    All other components within normal limits  CBC - Abnormal; Notable for the following:    WBC 3.7 (*)    RBC 3.61 (*)    Hemoglobin 9.8 (*)    HCT 31.6 (*)    RDW 17.5 (*)    All other components within normal limits  BRAIN NATRIURETIC PEPTIDE - Abnormal; Notable for the following:    B Natriuretic Peptide 3,431.2 (*)    All other components within normal limits  I-STAT TROPOININ, ED - Abnormal; Notable for the following:    Troponin i, poc 0.12 (*)    All other components within normal limits  CULTURE, BLOOD (ROUTINE X 2)  CULTURE, BLOOD (ROUTINE X 2)  URINALYSIS, ROUTINE W REFLEX MICROSCOPIC  BASIC METABOLIC PANEL  I-STAT CG4 LACTIC ACID, ED  I-STAT CG4 LACTIC ACID, ED    EKG  EKG Interpretation  Date/Time:  Tuesday February 16 2017 12:35:23 EST Ventricular Rate:  108 PR Interval:  136 QRS Duration: 82 QT Interval:  384 QTC Calculation: 514 R Axis:   107 Text Interpretation:  Sinus  tachycardia Biatrial enlargement Rightward axis Left ventricular hypertrophy with repolarization abnormality Abnormal ECG No significant change since last tracing Confirmed by YAO  MD, DAVID ([id number]) on 02/16/2017 3:58:32 PM       Radiology Dg Chest 2 View  Result Date: 02/16/2017 CLINICAL DATA:  Shortness of breath for the past 2 days. History  of dialysis dependent renal failure, lupus, chronic CHF. EXAM: CHEST  2 VIEW COMPARISON:  PA and lateral chest x-ray of March 08, 2016 FINDINGS: The lungs are well-expanded. There are bilateral pleural effusions which have increased slightly in size since the previous study. There is patchy parenchymal density in the mid and lower lungs bilaterally compatible with pulmonary edema. The pulmonary vascularity is engorged. The cardiac silhouette is enlarged. The trachea is midline. The observed bony thorax exhibits no acute abnormality. IMPRESSION: CHF with pulmonary interstitial and alveolar edema. Small bilateral pleural effusions have increased in size. One cannot exclude superimposed pneumonia at the lung bases in the appropriate clinical setting. Electronically Signed   By: David  Martinique M.D.   On: 02/16/2017 13:09    Procedures Procedures (including critical care time)  Medications Ordered in ED Medications  temazepam (RESTORIL) capsule 15 mg (not administered)  cinacalcet (SENSIPAR) tablet 30 mg (not administered)  albuterol (PROVENTIL HFA;VENTOLIN HFA) 108 (90 Base) MCG/ACT inhaler 1-2 puff (not administered)  hydrALAZINE (APRESOLINE) tablet 25 mg (not administered)  isosorbide mononitrate (IMDUR) 24 hr tablet 30 mg (not administered)  heparin injection 5,000 Units (not administered)  hydroxychloroquine (PLAQUENIL) tablet 400 mg (not administered)  cefTRIAXone (ROCEPHIN) 1 g in dextrose 5 % 50 mL IVPB (0 g Intravenous Stopped 02/16/17 1758)     Initial Impression / Assessment and Plan / ED Course  I have reviewed the triage vital signs and the  nursing notes.  Pertinent labs & imaging results that were available during my care of the patient were reviewed by me and considered in my medical decision making (see chart for details).     Final Clinical Impressions(s) / ED Diagnoses   Final diagnoses:  SOB (shortness of breath)    Labs: Blood culture, i-STAT troponin, BMP, CBC  Imaging: DG chest 2 view  Consults: Internal medicine, Nephrology   Therapeutics:  Discharge Meds:   Assessment/Plan: 35 year old female presents today with cough and shortness of breath.  Patient has a history of CHF, presentation today consistent with CHF exacerbation.  Patient also noted to have productive cough, question pneumonia today.  Patient will be started on community-acquired antibiotics, internal medicine teaching service consulted for admission.   Nephrology was consulted.  Case was discussed with Dr. Mercy Moore , he does not feel that emergent dialysis was indicated in this patient at this time.     New Prescriptions New Prescriptions   No medications on file     Okey Regal, PA-C 02/16/17 McIntire Yao, MD 02/19/17 [phone]

## 2017-02-16 NOTE — ED Notes (Signed)
Dr Wilson Singer made aware of trop .12

## 2017-02-16 NOTE — Progress Notes (Signed)
Pt resting in bed watching TV, no c/o pain at this time, refused bed alarm, pt alert and oriented. Will continue to do hourly rounding.

## 2017-02-16 NOTE — ED Notes (Signed)
Patient changing into gown 

## 2017-02-16 NOTE — ED Notes (Signed)
Phlebotomy at bedside to collect Blood Culture #2

## 2017-02-16 NOTE — ED Triage Notes (Signed)
Pt is dialysis pt, last treatment was this morning. Reports sob and increase in cough since Sunday night. Reports cough is productive with yellow sputum, denies fever. No resp distress is notd, ekg done at triage. Reports also having hx of chf.

## 2017-02-16 NOTE — H&P (Signed)
Date: 02/16/2017               Patient Name:  [name] MRN: [id number]  DOB: [birth year] Age / Sex: 36 y.o., female   PCP: Einar Gip, DO         Medical Service: Internal Medicine Teaching Service         Attending Physician: Dr. Annia Belt, MD    First Contact: Dr. Velna Ochs Pager: [phone]  Second Contact: Dr. Maryellen Pile Pager: [phone]       After Hours (After 5p/  First Contact Pager: [phone]  weekends / holidays): Second Contact Pager: [phone]   Chief Complaint: SOB  History of Present Illness: Patient is a 36 yo F with pmhx of systemic lupus erythematosus, ESRD on HD, and HFrEF (EF 20-25%) who presents with worsening SOB. Patient reports she has been cutting her dialysis short recently due to sweating during her sessions. Her last dialysis session was today and she reports signing off after 3 hours (normal 3 hours 45 minutes). She began to feels short of breath on Sunday. Sine then she has had progression of her symptoms. She now endorses significant dyspnea on exertion and orthopnea to the point where she has to bend forward over a pillow to sleep. She endorses a cough for 1 month and some chest tightness. She denies weight gain that she's noticed or lower extremity swelling. She reports that her dry weight is 52.7kg. She denies dietary indiscretion (increased fluid intake, salt intake) and actually reports a poor appetite over the past few days. She has also been out of her carvedilol, isosorbide, hydralazine, and plaquenil since December.  She reports compliance with all her other medications. On ROS, she endorses recent bleeding of her gums and abnormal vaginal bleeding. She does not take birth control pills.   On arrival to the ED, she was afebrile T 97.8 and hemodynamically stable with BP 163/119, HR 106, RR 20 and oxygen 94% on RA. CXR was notable for interstitial edema and bilateral pleural effusions, increased in size from prior imaging.  Electrolytes on BMP were within normal range (sodium 136, potassium 3.6). CBC was notable for mild leukopenia (wbc 3.7) and normocytic anemia improved from prior labs. BNP was elevated > 3,000. I-stat troponin 0.12. EKG was sinus rhythm, new t wave inversions in lead V5.   Meds:  Current Meds  Medication Sig  . albuterol (PROVENTIL HFA;VENTOLIN HFA) 108 (90 Base) MCG/ACT inhaler Inhale 1-2 puffs into the lungs every 6 (six) hours as needed for wheezing or shortness of breath.  . calcium acetate (PHOSLO) 667 MG capsule Take 2,001-4,002 mg by mouth See admin instructions. 4,002 mg three times a day with each meal and 2,001 mg with each snack  . Cholecalciferol (VITAMIN D-3 PO) Take 1 capsule by mouth See admin instructions. Every Tues/Thurs/Sat at dialysis  . cinacalcet (SENSIPAR) 30 MG tablet Take 30 mg by mouth daily.  . darbepoetin (ARANESP) 60 MCG/0.3ML SOLN injection Inject 0.3 mLs (60 mcg total) into the vein every Wednesday with hemodialysis. (Patient taking differently: Inject 60 mcg into the vein every 7 (seven) days. )  . ferric gluconate 62.5 mg in sodium chloride 0.9 % 100 mL Inject 62.5 mg into the vein every Thursday with hemodialysis.  . Nutritional Supplements (FEEDING SUPPLEMENT, NEPRO CARB STEADY,) LIQD Take 237 mLs by mouth See admin instructions. At every dialysis treatment on Tues/Thurs/Sat  . UNABLE TO FIND Cornstarch: Eats 7-8 tablespoonsful daily  Allergies: Allergies as of 02/16/2017 - Review Complete 02/16/2017  Allergen Reaction Noted  . Tobramycin sulfate Swelling 07/05/2012   Past Medical History:  Diagnosis Date  . Benign hypertension with ESRD (end-stage renal disease) ([place]) 03/15/2014  . Chronic systolic congestive heart failure ([place]) 03/16/2016  . H/O pericarditis 01/17/2013  . H/O pleural effusion 01/17/2013  . Lupus (systemic lupus erythematosus) ([facility])    Previously followed with Dr. Charlestine Night, has not followed up recently  . Lupus nephritis ([place]) 2006    Renal biopsy shows segmental endocapillary proliferation and cellular crescent formation (Class IIIA) and lupus membranous glomerulopathy (Class V, stage II)  . Polysubstance abuse    cocaine, MJ, tobacco  . S/P pericardiocentesis 01/17/2013   H/o pericardial effusion with tamponade 2006     Family History: HTN, lung cancer in grandmother, heart failure in multiple family members, 2 aunts with lupus, 2 cousins on dialysis   Social History: Lives in [place]. She reports smoking 4-5 cigarettes a day x 10 years.  Patient reports binge drinking every 2 months or so for social occasions, during which she will drink ~ 10 shots of liquor. She denies regular alcohol use. She endorsed cocaine use back in February. She used to smoke marijuana but has been unable to tolerate it recently due to her cough.   Review of Systems: A complete ROS was negative except as per HPI.   Physical Exam: Blood pressure (!) 168/113, pulse 105, temperature 100 F (37.8 C), temperature source Rectal, resp. rate 16, height 5\' 5"  (1.651 m), weight 115 lb 1.3 oz (52.2 kg), SpO2 93 %. Constitutional: Thin lady sitting up in bed, appears anxious HEENT: Atraumatic, normocephalic. PERRL, anicteric sclera.  Neck: Supple, trachea midline.  Cardiovascular: Tachycardic but regular, no murmurs, rubs, or gallops.  Pulmonary/Chest: Decreased breath sounds at the bases R>L, no wheezes, rales, or rhonchi. No chest wall abnormalities.  Abdominal: Soft, non tender, non distended. +BS.  Extremities: Warm and well perfused. Distal pulses intact. No edema.  Neurological: A&Ox3, CN II - XII grossly intact.  Skin: No rashes or erythema  Psychiatric: Normal mood, anxious affect  EKG: Personally reviewed. Sinus tach. New T wave inversions in V5. T wave inversions in V6 noted on prior tracing. Otherwise unchanged.  CXR: Personally reviewed. Agree with report. Interstitial markings bilaterally, bilateral pleural effusions. No consolidations.    Assessment & Plan by Problem:  Patient is a 36 yo F with pmhx of systemic lupus erythematosus, ESRD on HD, and HFrEF who presented with SOB, admitted for management of hypervolemia.   Hypervolemia: Patient presented with SOB and significant dyspnea on exertion in the setting of ESRD on HD (T/Th/Sat) and HFrEF (EF 20-25%). Patient reports she has been consistently cutting her dialysis short about 30 minutes due to diaphoresis during the sessions. Last session was today and she signed off after 3 hours (normally 3 hours 45 minutes). CXR on admission showed interstitial edema and bilateral pleural effusions. BNP > 3,000. Vitals and electrolytes are stable. She is saturating well on room air. She appears to be at her dry weight.  -- Nephrology consult for fluid managemnt, appreciate recommendations  -- Fluid restrict -- Strict I/Os -- Daily weights  -- Supplemental oxygen prn to maintain O2 > 92%  -- Tele monitoring   HFrEF: Last echocardiogram one year ago with EF 20 - 25%. Patient has been out of her carvedilol, isosorbide, and hydralazine since December.  -- Hold beta blocker -- fluid management per nephro  -- Restart Imdur 30  mg daily   HTN: -- Continue home Imdur 30 mg daily -- Continue home hydralazine 25 mg TID   Lupus: -- Restart plaquenil 400 mg daily   Insomnia: -- Continue home restoril 15 mg QHS   FEN: Fluid restric, dialysis, Renal diet VTE ppx: sub q heparin  Code Status: FULL   Dispo: Admit patient to Inpatient with expected length of stay greater than 2 midnights.  Signed: Velna Ochs, MD 02/16/2017, 5:58 PM  Pager: [phone]

## 2017-02-16 NOTE — ED Notes (Signed)
Attempted report 

## 2017-02-17 ENCOUNTER — Inpatient Hospital Stay (HOSPITAL_COMMUNITY): Payer: Medicaid Other

## 2017-02-17 DIAGNOSIS — Z8349 Family history of other endocrine, nutritional and metabolic diseases: Secondary | ICD-10-CM | POA: Diagnosis not present

## 2017-02-17 DIAGNOSIS — Z8249 Family history of ischemic heart disease and other diseases of the circulatory system: Secondary | ICD-10-CM | POA: Diagnosis not present

## 2017-02-17 DIAGNOSIS — N186 End stage renal disease: Secondary | ICD-10-CM | POA: Diagnosis present

## 2017-02-17 DIAGNOSIS — I248 Other forms of acute ischemic heart disease: Secondary | ICD-10-CM | POA: Diagnosis present

## 2017-02-17 DIAGNOSIS — I5022 Chronic systolic (congestive) heart failure: Secondary | ICD-10-CM | POA: Diagnosis not present

## 2017-02-17 DIAGNOSIS — I132 Hypertensive heart and chronic kidney disease with heart failure and with stage 5 chronic kidney disease, or end stage renal disease: Secondary | ICD-10-CM | POA: Diagnosis present

## 2017-02-17 DIAGNOSIS — Z9114 Patient's other noncompliance with medication regimen: Secondary | ICD-10-CM | POA: Diagnosis not present

## 2017-02-17 DIAGNOSIS — Z9115 Patient's noncompliance with renal dialysis: Secondary | ICD-10-CM | POA: Diagnosis not present

## 2017-02-17 DIAGNOSIS — E8889 Other specified metabolic disorders: Secondary | ICD-10-CM | POA: Diagnosis present

## 2017-02-17 DIAGNOSIS — D72819 Decreased white blood cell count, unspecified: Secondary | ICD-10-CM | POA: Diagnosis present

## 2017-02-17 DIAGNOSIS — I509 Heart failure, unspecified: Secondary | ICD-10-CM | POA: Diagnosis not present

## 2017-02-17 DIAGNOSIS — R000 Tachycardia, unspecified: Secondary | ICD-10-CM | POA: Diagnosis present

## 2017-02-17 DIAGNOSIS — Z801 Family history of malignant neoplasm of trachea, bronchus and lung: Secondary | ICD-10-CM | POA: Diagnosis not present

## 2017-02-17 DIAGNOSIS — D509 Iron deficiency anemia, unspecified: Secondary | ICD-10-CM | POA: Diagnosis present

## 2017-02-17 DIAGNOSIS — Z882 Allergy status to sulfonamides status: Secondary | ICD-10-CM | POA: Diagnosis not present

## 2017-02-17 DIAGNOSIS — E8779 Other fluid overload: Secondary | ICD-10-CM | POA: Diagnosis not present

## 2017-02-17 DIAGNOSIS — Z992 Dependence on renal dialysis: Secondary | ICD-10-CM

## 2017-02-17 DIAGNOSIS — R7989 Other specified abnormal findings of blood chemistry: Secondary | ICD-10-CM | POA: Diagnosis present

## 2017-02-17 DIAGNOSIS — I340 Nonrheumatic mitral (valve) insufficiency: Secondary | ICD-10-CM | POA: Diagnosis present

## 2017-02-17 DIAGNOSIS — G4700 Insomnia, unspecified: Secondary | ICD-10-CM | POA: Diagnosis present

## 2017-02-17 DIAGNOSIS — F17210 Nicotine dependence, cigarettes, uncomplicated: Secondary | ICD-10-CM | POA: Diagnosis present

## 2017-02-17 DIAGNOSIS — R0602 Shortness of breath: Secondary | ICD-10-CM | POA: Diagnosis present

## 2017-02-17 DIAGNOSIS — N2581 Secondary hyperparathyroidism of renal origin: Secondary | ICD-10-CM | POA: Diagnosis present

## 2017-02-17 DIAGNOSIS — Z881 Allergy status to other antibiotic agents status: Secondary | ICD-10-CM | POA: Diagnosis not present

## 2017-02-17 DIAGNOSIS — I5023 Acute on chronic systolic (congestive) heart failure: Secondary | ICD-10-CM | POA: Diagnosis present

## 2017-02-17 DIAGNOSIS — M3214 Glomerular disease in systemic lupus erythematosus: Secondary | ICD-10-CM | POA: Diagnosis present

## 2017-02-17 DIAGNOSIS — Z79899 Other long term (current) drug therapy: Secondary | ICD-10-CM | POA: Diagnosis not present

## 2017-02-17 LAB — GLUCOSE, CAPILLARY
GLUCOSE-CAPILLARY: 61 mg/dL — AB (ref 65–99)
GLUCOSE-CAPILLARY: 84 mg/dL (ref 65–99)
Glucose-Capillary: 95 mg/dL (ref 65–99)

## 2017-02-17 LAB — BASIC METABOLIC PANEL
ANION GAP: 16 — AB (ref 5–15)
BUN: 42 mg/dL — ABNORMAL HIGH (ref 6–20)
CHLORIDE: 93 mmol/L — AB (ref 101–111)
CO2: 29 mmol/L (ref 22–32)
CREATININE: 8.87 mg/dL — AB (ref 0.44–1.00)
Calcium: 8.9 mg/dL (ref 8.9–10.3)
GFR calc non Af Amer: 5 mL/min — ABNORMAL LOW (ref >60)
GFR, EST AFRICAN AMERICAN: 6 mL/min — AB (ref >60)
Glucose, Bld: 73 mg/dL (ref 65–99)
POTASSIUM: 3.9 mmol/L (ref 3.5–5.1)
Sodium: 138 mmol/L (ref 135–145)

## 2017-02-17 LAB — ECHOCARDIOGRAM COMPLETE
Height: 65 in
WEIGHTICAEL: 1816 [oz_av]

## 2017-02-17 LAB — MRSA PCR SCREENING: MRSA BY PCR: NEGATIVE

## 2017-02-17 MED ORDER — ACETAMINOPHEN 325 MG PO TABS
ORAL_TABLET | ORAL | Status: AC
  Administered 2017-02-17: 650 mg via ORAL
  Filled 2017-02-17: qty 2

## 2017-02-17 MED ORDER — DARBEPOETIN ALFA 150 MCG/0.3ML IJ SOSY: 150.0000 ug | PREFILLED_SYRINGE | INTRAMUSCULAR | Status: DC

## 2017-02-17 MED ORDER — DIPHENHYDRAMINE HCL 25 MG PO CAPS
25.0000 mg | ORAL_CAPSULE | Freq: Once | ORAL | Status: AC
  Administered 2017-02-17: 25 mg via ORAL

## 2017-02-17 MED ORDER — ACETAMINOPHEN 325 MG PO TABS
650.0000 mg | ORAL_TABLET | ORAL | Status: DC | PRN
  Administered 2017-02-17 – 2017-02-18 (×4): 650 mg via ORAL
  Filled 2017-02-17 (×3): qty 2

## 2017-02-17 MED ORDER — CALCITRIOL 0.5 MCG PO CAPS
2.7500 ug | ORAL_CAPSULE | ORAL | Status: DC
  Administered 2017-02-18: 2.75 ug via ORAL
  Filled 2017-02-17: qty 1

## 2017-02-17 MED ORDER — CALCIUM ACETATE (PHOS BINDER) 667 MG PO CAPS
4002.0000 mg | ORAL_CAPSULE | Freq: Three times a day (TID) | ORAL | Status: DC
  Administered 2017-02-17 – 2017-02-18 (×3): 4002 mg via ORAL
  Filled 2017-02-17 (×3): qty 6

## 2017-02-17 MED ORDER — VITAMIN D 1000 UNITS PO TABS: 1000.0000 [IU] | ORAL_TABLET | ORAL | Status: DC

## 2017-02-17 MED ORDER — CALCIUM ACETATE 667 MG PO CAPS: 2001.0000 mg | ORAL_CAPSULE | ORAL | Status: DC

## 2017-02-17 MED ORDER — RENA-VITE PO TABS
1.0000 | ORAL_TABLET | Freq: Every day | ORAL | Status: DC
  Administered 2017-02-17: 1 via ORAL
  Filled 2017-02-17: qty 1

## 2017-02-17 MED ORDER — CALCIUM ACETATE (PHOS BINDER) 667 MG PO CAPS: 2001.0000 mg | ORAL_CAPSULE | ORAL | Status: DC | PRN

## 2017-02-17 MED ORDER — DIPHENHYDRAMINE HCL 25 MG PO CAPS
ORAL_CAPSULE | ORAL | Status: AC
  Administered 2017-02-17: 25 mg via ORAL
  Filled 2017-02-17: qty 1

## 2017-02-17 NOTE — Progress Notes (Signed)
Pt resting in bed, denied pain at this time, refused bed alarm on. Will continue hourly rounding.

## 2017-02-17 NOTE — Procedures (Signed)
I was present at this session.  I have reviewed the session itself and made appropriate changes.  BP^ to start, access prss ok.  tol vol off Breyonna Nault L 2/21/20183:42 PM

## 2017-02-17 NOTE — Consult Note (Signed)
[facility] Renal Consultation Note  Indication for Consultation:  Management of ESRD/hemodialysis; anemia, hypertension/volume and secondary hyperparathyroidism  HPI: [name] is a 36 y.o. 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   Cos sob / chest discomfort progressive past weeks. Cough with yellow sputum / blood streaked this week .Denies fevers, chills. erports noncompliance with bp meds "has also been out of her carvedilol, isosorbide, hydralazine, and plaquenil since December last year."  In the ER presented with  T 97.8 ,  BP 163/119, HR 106, RR 20 and oxygen 94% on RA. CXR was notable for interstitial edema and bilateral pleural effusions. K 3.6 / hgb 9.8. And BNP was elevated > 3,000. I-stat troponin 0.12 cw with volume overload in ESRD pt.  She agrees to HD today and HD for volume removal       Past Medical History:  Diagnosis Date  . Benign hypertension with ESRD (end-stage renal disease) ([place]) 03/15/2014  . Chronic systolic congestive heart failure ([place]) 03/16/2016  . H/O pericarditis 01/17/2013  . H/O pleural effusion 01/17/2013  . Lupus (systemic lupus erythematosus) ([facility])    Previously followed with Dr. Charlestine Night, has not followed up recently  . 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 Polysubstance abuse    cocaine, MJ, tobacco  . S/P pericardiocentesis 01/17/2013   H/o pericardial effusion with  tamponade 2006     Past Surgical History:  Procedure Laterality Date  . BASCILIC VEIN TRANSPOSITION Left 02/05/2014   Procedure: [place];  Surgeon: [name], MD;  Location: [facility];  Service: Vascular;  Laterality: Left;  Marland Kitchen VENOGRAM Right 01/31/2014   Procedure: DIALYSIS CATHETER;  Surgeon: Serafina Mitchell, MD;  Location: [facility] CATH LAB;  Service: Cardiovascular;  Laterality: Right;     History reviewed. No pertinent family history.    reports that she has been smoking.  She has a 3.75 pack-year smoking history. She has never used smokeless tobacco. She reports that she drinks alcohol. She reports that she uses drugs, including Marijuana and Cocaine, about 5 times per week.   Allergies  Allergen Reactions  . Tobramycin Sulfate Swelling    Prior to Admission medications   Medication Sig Start Date End Date Taking? Authorizing Provider  albuterol (PROVENTIL HFA;VENTOLIN HFA) 108 (90 Base) MCG/ACT inhaler Inhale 1-2 puffs into the lungs every 6 (six) hours as needed for wheezing or shortness of breath. 02/03/16  Yes Lucious Groves, DO  calcium acetate (PHOSLO) 667 MG capsule Take 2,001-4,002 mg by mouth See admin instructions. 4,002 mg three times a day with each meal and 2,001 mg with each snack   Yes Historical Provider, MD  Cholecalciferol (VITAMIN D-3 PO) Take 1 capsule by mouth See admin instructions. Every Tues/Thurs/Sat at dialysis   Yes Historical Provider, MD  cinacalcet (SENSIPAR) 30 MG tablet Take 30 mg by mouth daily.   Yes Historical Provider, MD  darbepoetin (ARANESP) 60 MCG/0.3ML SOLN injection Inject 0.3 mLs (60 mcg total) into  the vein every Wednesday with hemodialysis. Patient taking differently: Inject 60 mcg into the vein every 7 (seven) days.  02/08/14  Yes Jessee Avers, MD  ferric gluconate 62.5 mg in sodium chloride 0.9 % 100 mL Inject 62.5 mg into the vein every Thursday with hemodialysis. 03/09/16  Yes Collier Salina, MD  Nutritional Supplements  (FEEDING SUPPLEMENT, NEPRO CARB STEADY,) LIQD Take 237 mLs by mouth See admin instructions. At every dialysis treatment on Tues/Thurs/Sat   Yes Historical Provider, MD  UNABLE TO FIND Cornstarch: Eats 7-8 tablespoonsful daily   Yes Historical Provider, MD  carvedilol (COREG) 3.125 MG tablet Take 1 tablet (3.125 mg total) by mouth 2 (two) times daily with a meal. Patient not taking: Reported on 02/16/2017 03/09/16   Collier Salina, MD  hydrALAZINE (APRESOLINE) 25 MG tablet Take 1 tablet (25 mg total) by mouth 3 (three) times daily. Patient not taking: Reported on 02/16/2017 03/09/16   Collier Salina, MD  hydroxychloroquine (PLAQUENIL) 200 MG tablet Take 2 tablets (400 mg total) by mouth daily. Patient not taking: Reported on 02/16/2017 06/24/15   Lucious Groves, DO  isosorbide mononitrate (IMDUR) 30 MG 24 hr tablet Take 1 tablet (30 mg total) by mouth daily. Patient not taking: Reported on 02/16/2017 03/09/16   Collier Salina, MD  multivitamin (RENA-VIT) TABS tablet Take 1 tablet by mouth daily.    Historical Provider, MD  ondansetron (ZOFRAN-ODT) 4 MG disintegrating tablet Take 1 tablet (4 mg total) by mouth every 8 (eight) hours as needed for nausea or vomiting. Patient not taking: Reported on 02/16/2017 03/09/16   Collier Salina, MD  oxyCODONE-acetaminophen (PERCOCET/ROXICET) 5-325 MG tablet Take 1 tablet by mouth every 8 (eight) hours as needed for moderate pain or severe pain. Patient not taking: Reported on 02/16/2017 03/09/16   Collier Salina, MD  temazepam (RESTORIL) 15 MG capsule Take 15 mg by mouth at bedtime. 02/28/16   Historical Provider, MD  triamcinolone cream (KENALOG) 0.1 % Apply 1 application topically 2 (two) times daily. Patient not taking: Reported on 02/16/2017 06/24/15   Lucious Groves, DO     Anti-infectives    Start     Dose/Rate Route Frequency Ordered Stop   02/16/17 1815  hydroxychloroquine (PLAQUENIL) tablet 400 mg     400 mg Oral Daily 02/16/17 1800      02/16/17 1615  cefTRIAXone (ROCEPHIN) 1 g in dextrose 5 % 50 mL IVPB     1 g 100 mL/hr over 30 Minutes Intravenous  Once 02/16/17 1606 02/16/17 1758   02/16/17 1615  azithromycin (ZITHROMAX) 500 mg in dextrose 5 % 250 mL IVPB  Status:  Discontinued     500 mg 250 mL/hr over 60 Minutes Intravenous  Once 02/16/17 1606 02/16/17 1736      Results for orders placed or performed during the hospital encounter of 02/16/17 (from the past 48 hour(s))  Basic metabolic panel     Status: Abnormal   Collection Time: 02/16/17 12:40 PM  Result Value Ref Range   Sodium 136 135 - 145 mmol/L   Potassium 3.6 3.5 - 5.1 mmol/L   Chloride 94 (L) 101 - 111 mmol/L   CO2 30 22 - 32 mmol/L   Glucose, Bld 89 65 - 99 mg/dL   BUN 31 (H) 6 - 20 mg/dL   Creatinine, Ser 7.01 (H) 0.44 - 1.00 mg/dL   Calcium 8.5 (L) 8.9 - 10.3 mg/dL   GFR calc non Af Amer 7 (L) >60 mL/min  GFR calc Af Amer 8 (L) >60 mL/min    Comment: (NOTE) The eGFR has been calculated using the CKD EPI equation. This calculation has not been validated in all clinical situations. eGFR's persistently <60 mL/min signify possible Chronic Kidney Disease.    Anion gap 12 5 - 15  CBC     Status: Abnormal   Collection Time: 02/16/17 12:40 PM  Result Value Ref Range   WBC 3.7 (L) 4.0 - 10.5 K/uL   RBC 3.61 (L) 3.87 - 5.11 MIL/uL   Hemoglobin 9.8 (L) 12.0 - 15.0 g/dL   HCT 31.6 (L) 36.0 - 46.0 %   MCV 87.5 78.0 - 100.0 fL   MCH 27.1 26.0 - 34.0 pg   MCHC 31.0 30.0 - 36.0 g/dL   RDW 17.5 (H) 11.5 - 15.5 %   Platelets 173 150 - 400 K/uL    Comment: PLATELET COUNT CONFIRMED BY SMEAR  Brain natriuretic peptide     Status: Abnormal   Collection Time: 02/16/17 12:40 PM  Result Value Ref Range   B Natriuretic Peptide 3,431.2 (H) 0.0 - 100.0 pg/mL  I-stat troponin, ED     Status: Abnormal   Collection Time: 02/16/17 12:57 PM  Result Value Ref Range   Troponin i, poc 0.12 (HH) 0.00 - 0.08 ng/mL   Comment NOTIFIED PHYSICIAN    Comment 3             Comment: Due to the release kinetics of cTnI, a negative result within the first hours of the onset of symptoms does not rule out myocardial infarction with certainty. If myocardial infarction is still suspected, repeat the test at appropriate intervals.   I-Stat CG4 Lactic Acid, ED  (not at  [facility])     Status: None   Collection Time: 02/16/17  5:04 PM  Result Value Ref Range   Lactic Acid, Venous 1.53 0.5 - 1.9 mmol/L  MRSA PCR Screening     Status: None   Collection Time: 02/16/17 10:01 PM  Result Value Ref Range   MRSA by PCR NEGATIVE NEGATIVE    Comment:        The GeneXpert MRSA Assay (FDA approved for NASAL specimens only), is one component of a comprehensive MRSA colonization surveillance program. It is not intended to diagnose MRSA infection nor to guide or monitor treatment for MRSA infections.   Basic metabolic panel     Status: Abnormal   Collection Time: 02/17/17  5:14 AM  Result Value Ref Range   Sodium 138 135 - 145 mmol/L   Potassium 3.9 3.5 - 5.1 mmol/L   Chloride 93 (L) 101 - 111 mmol/L   CO2 29 22 - 32 mmol/L   Glucose, Bld 73 65 - 99 mg/dL   BUN 42 (H) 6 - 20 mg/dL   Creatinine, Ser 8.87 (H) 0.44 - 1.00 mg/dL   Calcium 8.9 8.9 - 10.3 mg/dL   GFR calc non Af Amer 5 (L) >60 mL/min   GFR calc Af Amer 6 (L) >60 mL/min    Comment: (NOTE) The eGFR has been calculated using the CKD EPI equation. This calculation has not been validated in all clinical situations. eGFR's persistently <60 mL/min signify possible Chronic Kidney Disease.    Anion gap 16 (H) 5 - 15  Glucose, capillary     Status: Abnormal   Collection Time: 02/17/17  6:38 AM  Result Value Ref Range   Glucose-Capillary 61 (L) 65 - 99 mg/dL  Glucose, capillary     Status:  None   Collection Time: 02/17/17  7:02 AM  Result Value Ref Range   Glucose-Capillary 84 65 - 99 mg/dL   Comment 1 Notify RN    Comment 2 Document in Chart   Glucose, capillary     Status: None   Collection Time: 02/17/17   7:56 AM  Result Value Ref Range   Glucose-Capillary 95 65 - 99 mg/dL   Comment 1 Notify RN     ROS: See hpi   Physical Exam: Vitals:   02/17/17 0410 02/17/17 0512  BP: (!) 168/124 (!) 155/101  Pulse: (!) 114 95  Resp: (!) 22   Temp: 98 F (36.7 C)      General:Alert thin AAF  Chronically ill ,  HEENT: [place] , EOMI , anicteric , MM dry HTN retinopathy with silver wiring , aV nicking, art narrowing. Pharynx ok Neck: supple / pos jvd PCL Heart: Tachy reg 2/6 sem, s3 gallop , no rub LV lift Lungs: decr bs bases with scattered rales dullness to percussion in both bases,  Abdomen: bs pos. Soft , NT , ND Liver down 6 cm Extremities: no pedal edema , no pedal ulcers Skin:  Warm dry , no overt rash Papullar lesions on legs Neuro: alert, OX3, No overt focal deficits , moves all extrem.  Dialysis Access:  Pos bruit , L UA AVF Pos bruit   Dialysis Orders: Center: [place]  on TTS . EDW 52 kg  HD Bath 2k  , 2ca   Time 3 hr 34mn  Heparin 2000. Access  LUA AVF     Calcitriol  2.749m  Po /HD    aranesp 180 mcg q tues HD   Assessment/Plan 1. Volume overload  In Noncompliant HD Pt/ ( HO CM ) Also some wt loss with ? Uri/= UF on HD today  And  HD tomor.  Normal schedule TTS  Adherence primary issue, multiple complications from underdialysis,  Overall size will limit vol off at one tx (and should) 2. ESRD -  TTS HD  k = 3.9 this am  3. Hypertension/volume  - BP ^^ sec to volume and BP med noncompliance / UF  With hd today and tomor  / need to be cautious with BP meds to prevent BP drops on HD (NOT COMPLIANT AS OP= NOT SURE NEEDS ALL  MEDS LISTED ) 4. Anemia  - HGB  9.8 ESA q tue as op  Gvie here 5. Metabolic bone disease -  PO vit d on HD and Calcium Acetate as binder with meals (Phos 9 as op cw  Her noncompliance )  6. Nutrition - Renal diet and Renal vit 7. SLE- per admit Restart plaquenil 400 mg daily  8. HO Polysubstance abuse 9. Noncompliance  With HD and Meds = dw her again importance of  adherence with meds and HD Time   today as I have at op HD and as Document[facility]  Primary issue in care and complic  DaErnest HaberPA-C [id number]/21/2018, 9:27 AM I have seen and examined this patient and agree with the plan of care seen , eval, examined, counseled patient, discussed with staff.  .  Akanksha Bellmore L 02/17/2017, 11:06 AM

## 2017-02-17 NOTE — Progress Notes (Signed)
Pt asking for food this morning, advised that pt still NPO, talked to Dr. Gay Filler if  patient going for any procedure today, MD not sure, advised by MD to wait until primary team will do rounds this morning, pt updated and agreed with the plan.

## 2017-02-17 NOTE — Progress Notes (Signed)
Patient arrived to unit per bed.  Reviewed treatment plan and this RN agrees.  Report received from bedside RN, Tiffany.  Consent obtained.  Patient A & O X 4. Lung sounds diminished to ausculation in all fields. Generalized edema. Cardiac: NSR.  Prepped LUAVF with alcohol and cannulated with two 15 gauge needles.  Pulsation of blood noted.  Flushed access well with saline per protocol.  Connected and secured lines and initiated tx at 1512.  UF goal of 5000 mL and net fluid removal of 4500 mL.  Will continue to monitor.

## 2017-02-17 NOTE — Progress Notes (Signed)
Patient ID: [name], female   DOB: 05/09/1981, 36 y.o.   MRN: [id number] Please let us know if to d/c or make inpatient status.  Will do HD and consult if made inpatient.

## 2017-02-17 NOTE — Progress Notes (Signed)
   Per patient request, Advanced Directive (AD) documentation left at bedside.  If/when patient decides to move forward w/ AD, please page on-call chaplain or contact the Spiritual Care department (between the hours of 9AM - 3PM Mon - Fri).   Will follow, as needed.  - Rev. [place] MDiv ThM

## 2017-02-17 NOTE — Progress Notes (Signed)
Hypoglycemic Event  CBG: 61  Treatment:  1 cuo orange juice  Symptoms: none  Follow-up CBG: Time:0702 CBG Result:84  Possible Reasons for Event: Pt NPO since admission  Comments/MD notified: Dr. Donalee Citrin notified   [place], Blanch Media

## 2017-02-17 NOTE — Progress Notes (Signed)
   Subjective: Patient continues to complain of SOB. She is breathing comfortably on [place]. No events overnight.   Objective:  Vital signs in last 24 hours: Vitals:   02/17/17 0020 02/17/17 0410 02/17/17 0512 02/17/17 0900  BP: (!) 158/103 (!) 168/124 (!) 155/101 (!) 155/65  Pulse: 88 (!) 114 95 (!) 108  Resp: 20 (!) 22    Temp: 98.3 F (36.8 C) 98 F (36.7 C)  97.9 F (36.6 C)  TempSrc: Oral Oral  Oral  SpO2: 95% 100%  95%  Weight:  113 lb 8 oz (51.5 kg)    Height:       Physical Exam Constitutional: NAD, appears comfortable HEENT: JVD Cardiovascular: tachycardic but regular, S3 gallop.  Pulmonary/Chest: Decreased breath sounds at bilateral bases, no wheezes, rales, or rhonchi.  Abdominal: Soft, non tender, non distended. +BS.  Extremities: Warm and well perfused. Distal pulses intact. No edema.  Neurological: A&Ox3, CN II - XII grossly intact.   Assessment/Plan:  Patient is a 36 yo F with pmhx of systemic lupus erythematosus, ESRD on HD, and HFrEF who presented with SOB, admitted for management of hypervolemia.   Hypervolemia: Patient presented with SOB and significant dyspnea on exertion in the setting of ESRD on HD (T/Th/Sat) and HFrEF (EF 20-25%). Patient reports she has been consistently cutting her dialysis short about 30 minutes due to diaphoresis during the sessions. Her last session was 2/20 and she signed off after 3 hours (normally 3 hours 45 minutes). She has also been out of all her antihypertensive medications since December. CXR on admission showed interstitial edema and bilateral pleural effusions. BNP > 3,000. She reported that her dry weight is 52 kg, however on chart review it appears nephrology got her weight down to 44 kg at her last hospitalization one year ago. Here weight here is 51 kg and she does appear to be volume overloaded.  -- Nephrology consult, appreciate recommendations  -- Plan for HD today  -- Fluid restrict -- Strict I/Os -- Daily weights  --  Supplemental oxygen prn to maintain O2 > 92%  -- Tele monitoring   HFrEF: Last echocardiogram one year ago with EF 20 - 25%. Patient has been out of her carvedilol, isosorbide, and hydralazine since December. S3 gallop appreciated on exam.  -- Repeat echocardiogram today  -- Holding beta blocker -- Restarted Imdur 30 mg daily  -- fluid management per nephro   HTN: -- Continue home Imdur 30 mg daily -- Continue home hydralazine 25 mg TID   Lupus: -- Restarted plaquenil 400 mg daily   Insomnia: -- Continue home restoril 15 mg QHS   FEN: Fluid restric, dialysis, Renal diet VTE ppx: sub q heparin  Code Status: FULL   Dispo: Anticipated discharge in approximately 1-2 day(s).   Velna Ochs, MD 02/17/2017, 10:06 AM Pager: [phone]

## 2017-02-17 NOTE — Progress Notes (Signed)
  Echocardiogram 2D Echocardiogram has been performed.  [name] 02/17/2017, 12:16 PM

## 2017-02-18 ENCOUNTER — Telehealth: Payer: Self-pay

## 2017-02-18 LAB — RENAL FUNCTION PANEL
ALBUMIN: 2.5 g/dL — AB (ref 3.5–5.0)
Anion gap: 12 (ref 5–15)
BUN: 14 mg/dL (ref 6–20)
CALCIUM: 8.3 mg/dL — AB (ref 8.9–10.3)
CO2: 29 mmol/L (ref 22–32)
CREATININE: 5.15 mg/dL — AB (ref 0.44–1.00)
Chloride: 93 mmol/L — ABNORMAL LOW (ref 101–111)
GFR calc Af Amer: 12 mL/min — ABNORMAL LOW (ref >60)
GFR, EST NON AFRICAN AMERICAN: 10 mL/min — AB (ref >60)
GLUCOSE: 73 mg/dL (ref 65–99)
PHOSPHORUS: 4.4 mg/dL (ref 2.5–4.6)
POTASSIUM: 3.5 mmol/L (ref 3.5–5.1)
SODIUM: 134 mmol/L — AB (ref 135–145)

## 2017-02-18 LAB — BASIC METABOLIC PANEL
Anion gap: 11 (ref 5–15)
BUN: 18 mg/dL (ref 6–20)
CALCIUM: 9.3 mg/dL (ref 8.9–10.3)
CO2: 30 mmol/L (ref 22–32)
CREATININE: 6.39 mg/dL — AB (ref 0.44–1.00)
Chloride: 93 mmol/L — ABNORMAL LOW (ref 101–111)
GFR calc Af Amer: 9 mL/min — ABNORMAL LOW (ref >60)
GFR, EST NON AFRICAN AMERICAN: 8 mL/min — AB (ref >60)
GLUCOSE: 98 mg/dL (ref 65–99)
Potassium: 3.5 mmol/L (ref 3.5–5.1)
SODIUM: 134 mmol/L — AB (ref 135–145)

## 2017-02-18 MED ORDER — ISOSORBIDE MONONITRATE ER 30 MG PO TB24: 30.0000 mg | ORAL_TABLET | Freq: Every day | ORAL | 2 refills | Status: DC

## 2017-02-18 MED ORDER — DIPHENHYDRAMINE HCL 25 MG PO CAPS
25.0000 mg | ORAL_CAPSULE | Freq: Once | ORAL | Status: AC
  Administered 2017-02-18: 25 mg via ORAL

## 2017-02-18 MED ORDER — CALCITRIOL 0.5 MCG PO CAPS
ORAL_CAPSULE | ORAL | Status: AC
  Filled 2017-02-18: qty 5

## 2017-02-18 MED ORDER — HYDROXYCHLOROQUINE SULFATE 200 MG PO TABS: 400.0000 mg | ORAL_TABLET | Freq: Every day | ORAL | 2 refills | Status: DC

## 2017-02-18 MED ORDER — HYDRALAZINE HCL 25 MG PO TABS: 25.0000 mg | ORAL_TABLET | Freq: Three times a day (TID) | ORAL | 2 refills | Status: DC

## 2017-02-18 MED ORDER — RENA-VITE PO TABS: 1.0000 | ORAL_TABLET | Freq: Every day | ORAL | 0 refills | Status: DC

## 2017-02-18 MED ORDER — CARVEDILOL 3.125 MG PO TABS: 3.1250 mg | ORAL_TABLET | Freq: Two times a day (BID) | ORAL | 2 refills | Status: DC

## 2017-02-18 MED ORDER — DIPHENHYDRAMINE HCL 25 MG PO CAPS
ORAL_CAPSULE | ORAL | Status: AC
  Administered 2017-02-18: 25 mg via ORAL
  Filled 2017-02-18: qty 1

## 2017-02-18 MED ORDER — CALCITRIOL 0.25 MCG PO CAPS
ORAL_CAPSULE | ORAL | Status: AC
  Filled 2017-02-18: qty 1

## 2017-02-18 NOTE — Progress Notes (Addendum)
   Subjective: Patient feels much better today, says her breathing has improved. She is eager for discharge.   Objective:  Vital signs in last 24 hours: Vitals:   02/17/17 1855 02/17/17 1910 02/17/17 2012 02/18/17 0555  BP: (!) 125/102 (!) 134/98 138/89 (!) 138/93  Pulse: (!) 101 100 100 (!) 102  Resp: 18 18 18 18   Temp:  98.6 F (37 C) 98.8 F (37.1 C) 98.5 F (36.9 C)  TempSrc:  Oral Oral Oral  SpO2:  98% 100% 100%  Weight:  108 lb 0.4 oz (49 kg)  106 lb 8 oz (48.3 kg)  Height:       Physical Exam Constitutional: NAD, appears comfortable Cardiovascular: RRR, S3 gallop.  Pulmonary/Chest: Bibasilar crackles, decreased breath sounds at Left base, no wheezes, rales, or rhonchi.  Abdominal: Soft, non tender, non distended. +BS.  Extremities: Warm and well perfused. Distal pulses intact. No edema.  Neurological: A&Ox3, CN II - XII grossly intact.    Assessment/Plan:  ESRD on JZ:PHXTAVW presented with SOB, significant dyspnea on exertion found to be hypervolemic on admission (CXR with interstitial edema, bilateral pleural effusions, BNP >3,000). She has been non compliant with outpatient HD (cutting sessions short) and non compliant with multiple medications since December. S/p 1 session of HD yesterday. Weight is down to 48 kg from 52 on admission. Symptoms have improved. Plan for HD again today per nephrology, then hopefully discharge.  -- Nephrology consult, appreciate recommendations  -- Plan for HD again today  -- Fluid restrict -- Strict I/Os -- Daily weights  -- Supplemental oxygen prn to maintain O2 >92%  -- Tele monitoring   HFrEF:Last echocardiogram one year ago with EF 20 - 25%. Patient has been out of her carvedilol, isosorbide, and hydralazine since December. S3 gallop appreciated on exam. Repeat echocardiogram this admission shows improvement in EF to 45%, some inferolateral hypokinesis, and grade 2 diastolic dysfunction.  -- Holding beta blocker; likely restart  at discharge  -- Continue Imdur 30 mg daily  -- fluid management per nephro   HTN: -- Continue home Imdur 30 mg daily -- Continue home hydralazine 25 mg TID   Lupus: -- Restarted plaquenil 400 mg daily   Insomnia: -- Continue home restoril 15 mg QHS   FEN: Fluid restric, dialysis, Renal diet VTE ppx: sub q heparin  Code Status: FULL   Dispo: Anticipated discharge today pending nephrology recommendations.  Velna Ochs, MD 02/18/2017, 7:19 AM Pager: [phone]

## 2017-02-18 NOTE — Discharge Summary (Signed)
Name: [name] MRN: [id number] DOB: 02/18/1981 36 y.o. PCP: Einar Gip, DO  Date of Admission: 02/16/2017  3:06 PM Date of Discharge: 02/18/2017 Attending Physician: Annia Belt, MD  Discharge Diagnosis: 1. Hypervolemia 2. ESRD on HD 3. HFrEF  Discharge Medications: Allergies as of 02/18/2017      Reactions   Tobramycin Sulfate Swelling      Medication List    STOP taking these medications   oxyCODONE-acetaminophen 5-325 MG tablet Commonly known as:  PERCOCET/ROXICET   UNABLE TO FIND     TAKE these medications   albuterol 108 (90 Base) MCG/ACT inhaler Commonly known as:  PROVENTIL HFA;VENTOLIN HFA Inhale 1-2 puffs into the lungs every 6 (six) hours as needed for wheezing or shortness of breath.   calcium acetate 667 MG capsule Commonly known as:  PHOSLO Take 2,001-4,002 mg by mouth See admin instructions. 4,002 mg three times a day with each meal and 2,001 mg with each snack   carvedilol 3.125 MG tablet Commonly known as:  COREG Take 1 tablet (3.125 mg total) by mouth 2 (two) times daily with a meal.   cinacalcet 30 MG tablet Commonly known as:  SENSIPAR Take 30 mg by mouth daily.   darbepoetin 60 MCG/0.3ML Soln injection Commonly known as:  ARANESP Inject 0.3 mLs (60 mcg total) into the vein every Wednesday with hemodialysis. What changed:  when to take this   feeding supplement (NEPRO CARB STEADY) Liqd Take 237 mLs by mouth See admin instructions. At every dialysis treatment on Tues/Thurs/Sat   ferric gluconate 62.5 mg in sodium chloride 0.9 % 100 mL Inject 62.5 mg into the vein every Thursday with hemodialysis.   hydrALAZINE 25 MG tablet Commonly known as:  APRESOLINE Take 1 tablet (25 mg total) by mouth 3 (three) times daily.   hydroxychloroquine 200 MG tablet Commonly known as:  PLAQUENIL Take 2 tablets (400 mg total) by mouth daily. What changed:  Another medication with the same name was added. Make sure you understand how and  when to take each.   hydroxychloroquine 200 MG tablet Commonly known as:  PLAQUENIL Take 2 tablets (400 mg total) by mouth daily. What changed:  You were already taking a medication with the same name, and this prescription was added. Make sure you understand how and when to take each.   isosorbide mononitrate 30 MG 24 hr tablet Commonly known as:  IMDUR Take 1 tablet (30 mg total) by mouth daily.   multivitamin Tabs tablet Take 1 tablet by mouth daily.   ondansetron 4 MG disintegrating tablet Commonly known as:  ZOFRAN-ODT Take 1 tablet (4 mg total) by mouth every 8 (eight) hours as needed for nausea or vomiting.   temazepam 15 MG capsule Commonly known as:  RESTORIL Take 15 mg by mouth at bedtime.   triamcinolone cream 0.1 % Commonly known as:  KENALOG Apply 1 application topically 2 (two) times daily.   VITAMIN D-3 PO Take 1 capsule by mouth See admin instructions. Every Tues/Thurs/Sat at dialysis       Disposition and follow-up:   [name] was discharged from [facility] in Stable condition.  At the hospital follow up visit please address:  1.  ESRD on HD: Patient presented with SOB and DOE in the setting of HD non compliance (cutting sessions short), running out of multiple medications since December (carvedilol, isosorbide, hydralazine, and plaquenil), and recent cocaine use. Patient says she requested refills from the Internal Medicine clinic but  was denied because she hadn't been seen in a year. Patient had 2 sessions of HD this admission. Weight on discharge cam down from 52 -> 47 kg. Encouraged compliance with HD and home medications. Refills were sent to her pharmacy.   2.  Labs / imaging needed at time of follow-up: None   3.  Pending labs/ test needing follow-up: Blood cultures checked by EDP 02/16/2017 (No growth 3 days on discharge)   Follow-up Appointments:   Hospital Course by problem list:  1. ESRD on HD: Patient presented with  worsening SOB and significant dyspnea on exertion found to be hypervolemic on exam. Patient reported that she had ben cutting her HD sessions short by 30-45 minutes due to diaphoresis during her sessions. She also ran out of multiple medications in December (hydralazine, Imdur, Plaquenil, and coreg) and admitted to recent cocaine use. On arrival to the ED, she was afebrile T 97.8 and hemodynamically stable with BP 163/119, HR 106, RR 20 and oxygen 94% on RA. CXR was notable for interstitial edema and bilateral pleural effusions, increased in size from prior imaging. Electrolytes on BMP were within normal range (sodium 136, potassium 3.6).  BNP was elevated > 3,000. I-stat troponin 0.12 and EKG was sinus rhythm, with new t wave inversions in lead V5, otherwise unchanged. Patient was admitted and placed on telemetry overnight with no events. She underwent dialysis the following day. 4.5L were removed and weight came down 52 kg -> 48 kg. Her symptoms significantly improved. She underwent a second dialysis session on hospital day 3. Weight on discharge was 47 kg. Discharged with plans to resume T/Th/Sat dialysis schedule.   2. HFrEF: Last echocardiogram one year ago with EF 20 - 25%. Patient had been out of her carvedilol, isosorbide, and hydralazine since December. S3 gallop was appreciated on exam. Repeat echocardiogram this admission shows improvement in EF to 45%, some inferolateral hypokinesis, and grade 2 diastolic dysfunction. Coreg was held on admission due to her decompensated state, but restarted on discharge. Please follow up.  3. HTN: Imdur and hydralazine were restarted on admission. Coreg was restarted on discharge. Refills were sent to her pharmacy. Please follow up.   4. Lupus: Plaquenil 400 mg daily was restarted on admission. Refills sent to her pharmacy on discharge.   Discharge Vitals:   BP (!) 138/93 (BP Location: Right Arm)   Pulse (!) 102   Temp 98.5 F (36.9 C) (Oral)   Resp 18   Ht  5\' 5"  (1.651 m)   Wt 106 lb 8 oz (48.3 kg)   SpO2 100%   BMI 17.72 kg/m   Pertinent Labs, Studies, and Procedures:   02/16/2017 Chest 2 View: IMPRESSION: CHF with pulmonary interstitial and alveolar edema. Small bilateral pleural effusions have increased in size. One cannot exclude superimposed pneumonia at the lung bases in the appropriate clinical setting.   Ref. Range 02/16/2017 12:40  B Natriuretic Peptide Latest Ref Range: 0.0 - 100.0 pg/mL 3,431.2 (H)    02/18/2017 Echocardiogram: Study Conclusions - Left ventricle: The cavity size was normal. Wall thickness was   increased in a pattern of moderate LVH. The estimated ejection   fraction was 45%. Inferolateral hypokinesis. Features are   consistent with a pseudonormal left ventricular filling pattern,   with concomitant abnormal relaxation and increased filling   pressure (grade 2 diastolic dysfunction). - Aortic valve: There was no stenosis. - Mitral valve: Moderately calcified annulus. The posterior leaflet   is restricted with severe mitral regurgitation. - Left  atrium: The atrium was severely dilated. - Right ventricle: The cavity size was normal. Systolic function   was normal. - Pulmonary arteries: PA peak pressure: 38 mm Hg (S). - Systemic veins: IVC measured 1.9 cm with < 50% respirophasic   variation, suggesting RA pressure 8 mmHg. - Pericardium, extracardiac: There is a left-sided pleural   effusion. There was no pericardial effusion. Impressions: - Normal LV size with moderate LV hypertrophy. EF 45%.   Inferolateral hypokinesis. Moderate diastolic dysfunction. Normal   RV size and systolic function. Restricted posterior mitral   leaflet with severe mitral regurgitation (?infarct-related with   inferolateral wall motion abnormality). Severe left atrial   enlargement. Mild pulmonary hypertension.  Discharge Instructions: Discharge Instructions    Call MD for:  difficulty breathing, headache or visual  disturbances    Complete by:  As directed    Call MD for:  persistant dizziness or light-headedness    Complete by:  As directed    Call MD for:  temperature >100.4    Complete by:  As directed    Diet - low sodium heart healthy    Complete by:  As directed    Discharge instructions    Complete by:  As directed    Ms. Blattner, it was a pleasure taking care of you. I am glad you are feeling better! Please continue to take all of your medicines as previously prescribed. Refills for your Hydralazine, Imdur, Plaquenil, and Coreg have been sent to your pharmacy. Please follow up in the Internal Medicine Clinic for your scheduled appointment on Friday, March 2nd at 10:15 am. If you have any questions or concerns, call our clinic at [phone] or after hours call [phone] and ask for the internal medicine resident on call. Thank you!   Increase activity slowly    Complete by:  As directed       Signed: Velna Ochs, MD 02/18/2017, 11:14 AM   Pager: [phone]

## 2017-02-18 NOTE — Progress Notes (Signed)
  [facility] Progress Note   Subjective: no c/o, feeling "better"  Vitals:   02/18/17 1300 02/18/17 1400 02/18/17 1420 02/18/17 1500  BP: 138/90 (!) 145/83 137/89 122/74  Pulse: (!) 129 (!) 101 (!) 102 96  Resp:  20    Temp:  98.2 F (36.8 C)    TempSrc:  Oral    SpO2:  99%    Weight:  48.4 kg (106 lb 11.2 oz)    Height:        Inpatient medications: . calcitRIOL      . calcitRIOL      . calcitRIOL  2.75 mcg Oral Q T,Th,Sa-HD  . calcium acetate  4,002 mg Oral TID WC  . cinacalcet  30 mg Oral Daily  . [START ON 02/23/2017] darbepoetin (ARANESP) injection - DIALYSIS  150 mcg Intravenous Q Tue-HD  . heparin  5,000 Units Subcutaneous Q8H  . hydrALAZINE  25 mg Oral TID  . hydroxychloroquine  400 mg Oral Daily  . isosorbide mononitrate  30 mg Oral Daily  . multivitamin  1 tablet Oral QHS  . temazepam  15 mg Oral QHS    acetaminophen, albuterol  Exam: Alert, chron ill appearing No jvd Chest dull at L base, o/w clear , no rales RRR 2/6 sem Abd soft ntnd +bs Ext no edema LUA AVF +bruit NF , 0x 3  Dialysis: [facility] 3h 37min  52kg   2/2 bath  Hep 2000  LUA AVF  - Calc 2.75 ug tiw  - Darbe 180 ug q tues w HD      Assessment: 1. Vol overload / pulm edema/ pleural effusions - due to missed HD/ nonadherence in addition to lean body wt loss. Plan HD again today, get vol down as tolerated.  2. ESRD TTS HD 3. HTN - stable, good BP on hydralazine only and getting vol down 4. HPTH - stable 5. SLE - plaquenil 6. Nonadherence 7. Anemia - Hb 9's, cont esa here  Plan - HD today, get vol down further, encouraged compliance   Kelly Splinter MD [place] pager [phone]   02/18/2017, 3:21 PM    Recent Labs Lab 02/17/17 0514 02/18/17 0421 02/18/17 1430  NA 138 134* 134*  K 3.9 3.5 3.5  CL 93* 93* 93*  CO2 29 29 30   GLUCOSE 73 73 98  BUN 42* 14 18  CREATININE 8.87* 5.15* 6.39*  CALCIUM 8.9 8.3* 9.3  PHOS  --  4.4  --     Recent  Labs Lab 02/18/17 0421  ALBUMIN 2.5*    Recent Labs Lab 02/16/17 1240  WBC 3.7*  HGB 9.8*  HCT 31.6*  MCV 87.5  PLT 173   Iron/TIBC/Ferritin/ %Sat    Component Value Date/Time   IRON 19 (L) 01/30/2014 0020   TIBC 127 (L) 01/30/2014 0020   FERRITIN 133 01/30/2014 0020   IRONPCTSAT 15 (L) 01/30/2014 0020

## 2017-02-18 NOTE — Telephone Encounter (Signed)
Hospital TOC per dr Philipp Ovens, discharge 02/18/2017, appt 02/26/2017.

## 2017-02-21 LAB — CULTURE, BLOOD (ROUTINE X 2)
Culture: NO GROWTH
Culture: NO GROWTH

## 2017-02-25 ENCOUNTER — Telehealth: Payer: Self-pay | Admitting: Internal Medicine

## 2017-02-25 NOTE — Telephone Encounter (Signed)
APT. REMINDER CALL, LMTCB °

## 2017-02-26 ENCOUNTER — Encounter: Payer: Self-pay | Admitting: Internal Medicine

## 2017-02-26 ENCOUNTER — Ambulatory Visit: Payer: Medicaid Other

## 2017-03-03 NOTE — Telephone Encounter (Signed)
No answer, no vmail 

## 2017-03-23 ENCOUNTER — Telehealth: Payer: Self-pay | Admitting: Internal Medicine

## 2017-03-23 NOTE — Telephone Encounter (Signed)
APT. REMINDER CALL, LMTCB °

## 2017-03-24 ENCOUNTER — Ambulatory Visit: Payer: Medicaid Other

## 2017-03-24 ENCOUNTER — Emergency Department (HOSPITAL_COMMUNITY)
Admission: EM | Admit: 2017-03-24 | Discharge: 2017-03-24 | Disposition: A | Discharge status: Home / Self Care | Payer: Medicaid Other | Attending: Emergency Medicine | Admitting: Emergency Medicine

## 2017-03-24 ENCOUNTER — Encounter (HOSPITAL_COMMUNITY): Payer: Self-pay | Admitting: *Deleted

## 2017-03-24 DIAGNOSIS — I132 Hypertensive heart and chronic kidney disease with heart failure and with stage 5 chronic kidney disease, or end stage renal disease: Secondary | ICD-10-CM | POA: Insufficient documentation

## 2017-03-24 DIAGNOSIS — I5022 Chronic systolic (congestive) heart failure: Secondary | ICD-10-CM | POA: Insufficient documentation

## 2017-03-24 DIAGNOSIS — N186 End stage renal disease: Secondary | ICD-10-CM | POA: Insufficient documentation

## 2017-03-24 DIAGNOSIS — K0889 Other specified disorders of teeth and supporting structures: Secondary | ICD-10-CM | POA: Diagnosis present

## 2017-03-24 DIAGNOSIS — Z992 Dependence on renal dialysis: Secondary | ICD-10-CM | POA: Insufficient documentation

## 2017-03-24 DIAGNOSIS — F17210 Nicotine dependence, cigarettes, uncomplicated: Secondary | ICD-10-CM | POA: Diagnosis not present

## 2017-03-24 MED ORDER — BUPIVACAINE-EPINEPHRINE (PF) 0.5% -1:200000 IJ SOLN
1.8000 mL | Freq: Once | INTRAMUSCULAR | Status: AC
  Administered 2017-03-24: 1.8 mL

## 2017-03-24 MED ORDER — ACETAMINOPHEN 500 MG PO TABS: 1000.0000 mg | ORAL_TABLET | Freq: Four times a day (QID) | ORAL | 0 refills | Status: AC | PRN

## 2017-03-24 MED ORDER — PENICILLIN V POTASSIUM 500 MG PO TABS: 500.0000 mg | ORAL_TABLET | Freq: Four times a day (QID) | ORAL | 0 refills | Status: AC

## 2017-03-24 NOTE — ED Triage Notes (Signed)
Pt reports right side dental pain, ear pain and her "ear is popping." dialysis pt, last treatment was yesterday. Hypertensive at triage.

## 2017-03-24 NOTE — Discharge Planning (Signed)
Pt up for discharge. EDCM reviewed chart for possible CM needs.  No needs identified or communicated.  

## 2017-03-24 NOTE — ED Notes (Signed)
Pt comfortable with discharge and follow up instructions. Pt declines wheelchair, escorted to waiting area by this RN. Rx x2. PT declines to sign discharge esignature

## 2017-03-24 NOTE — ED Provider Notes (Signed)
[facility] DEPT Provider Note   CSN: [id number] Arrival date & time: 03/24/17 8502     History    Chief Complaint  Patient presents with  . Dental Pain  . Otalgia     HPI [name] is a 36 y.o. female.  36yo F w/ extensive PMH below including ESRD on HD T/TH/Sat, SLE, polysubstance abuse who p/w R dental pain. The patient has had a few weeks of pain in her right lower teeth that has gotten worse recently. The pain has been severe today. She denies any recent broken teeth. No fevers or vomiting. She has an appointment with her internal medicine physician this week but came in today because the pain became so severe. She has not yet seen a dentist. She also reports associated right ear pain.   Past Medical History:  Diagnosis Date  . Benign hypertension with ESRD (end-stage renal disease) ([place]) 03/15/2014  . Chronic systolic congestive heart failure ([place]) 03/16/2016  . H/O pericarditis 01/17/2013  . H/O pleural effusion 01/17/2013  . Lupus (systemic lupus erythematosus) ([facility])    Previously followed with Dr. Charlestine Night, has not followed up recently  . 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 Polysubstance abuse    cocaine, MJ, tobacco  . S/P pericardiocentesis 01/17/2013   H/o pericardial effusion with tamponade 2006      Patient Active Problem List   Diagnosis Date Noted  . ESRD (end stage renal disease) on dialysis ([place]) 02/17/2017  . SOB (shortness of breath) 02/16/2017  . Chronic systolic congestive heart failure ([place]) 03/16/2016  . Cough   . Ascites   . Follow up   . Other hypervolemia   . S/P thoracentesis   . Abdominal pain   . Pleural effusion   . Hemoptysis   . Myalgia   . Pulmonary edema   . Crack cocaine overdose   . ESRD (end stage renal disease) ([place]) 03/03/2016  . Hypertensive emergency 03/03/2016  .  Acute bronchitis 02/03/2016  . Volume overload 11/16/2015  . Shortness of breath 11/16/2015  . Acute UTI   . Hyperkalemia 09/25/2015  . Rash and nonspecific skin eruption 06/26/2015  . Secondary Raynaud's phenomenon 06/24/2015  . Cramping of hands 04/19/2014  . Unspecified contraceptive management 04/19/2014  . End stage renal disease ([place]) 03/27/2014  . Insomnia 03/15/2014  . Benign hypertension with ESRD (end-stage renal disease) ([place]) 03/15/2014  . Tobacco abuse 02/15/2014  . Healthcare maintenance 02/15/2014  . Hypoalbuminemia 02/01/2014  . Nephrotic syndrome 02/01/2014  . ESRD on dialysis ([place]) 01/31/2014  . Pleural effusion, left 01/31/2014  . Metabolic acidosis, increased anion gap (IAG) 01/31/2014  . Microcytic anemia 01/29/2014  . Hypocalcemia 01/29/2014  . Streptococcal bacteremia 01/23/2013  . Cocaine abuse 01/18/2013  . Marijuana smoker ([place]) 01/18/2013  . H/O pericarditis 01/17/2013  . SLE (systemic lupus erythematosus) ([place]) 01/17/2013  . Lupus nephritis ([place]) 01/17/2013  . S/P pericardiocentesis 01/17/2013  . H/O pleural effusion 01/17/2013  . Nephrosis 01/17/2013  . Trichomoniasis 01/17/2013  . Preseptal cellulitis 01/17/2013    Past Surgical History:  Procedure Laterality Date  . BASCILIC VEIN TRANSPOSITION Left 02/05/2014   Procedure: [place];  Surgeon: [name], MD;  Location: [facility];  Service: Vascular;  Laterality: Left;  Marland Kitchen VENOGRAM Right 01/31/2014   Procedure: DIALYSIS CATHETER;  Surgeon: Serafina Mitchell, MD;  Location: [facility] CATH LAB;  Service: Cardiovascular;  Laterality: Right;    OB History    No data available        Home Medications    Prior to Admission medications   Medication Sig Start Date End Date Taking? Authorizing Provider  acetaminophen (TYLENOL) 500 MG tablet Take 2 tablets (1,000 mg total) by mouth every 6 (six) hours as needed for moderate pain. 03/24/17 03/28/17  Sharlett Iles, MD  albuterol (PROVENTIL  HFA;VENTOLIN HFA) 108 (90 Base) MCG/ACT inhaler Inhale 1-2 puffs into the lungs every 6 (six) hours as needed for wheezing or shortness of breath. 02/03/16   Lucious Groves, DO  calcium acetate (PHOSLO) 667 MG capsule Take 2,001-4,002 mg by mouth See admin instructions. 4,002 mg three times a day with each meal and 2,001 mg with each snack    Historical Provider, MD  carvedilol (COREG) 3.125 MG tablet Take 1 tablet (3.125 mg total) by mouth 2 (two) times daily with a meal. 02/18/17   Holley Raring, MD  Cholecalciferol (VITAMIN D-3 PO) Take 1 capsule by mouth See admin instructions. Every Tues/Thurs/Sat at dialysis    Historical Provider, MD  cinacalcet (SENSIPAR) 30 MG tablet Take 30 mg by mouth daily.    Historical Provider, MD  darbepoetin (ARANESP) 60 MCG/0.3ML SOLN injection Inject 0.3 mLs (60 mcg total) into the vein every Wednesday with hemodialysis. Patient taking differently: Inject 60 mcg into the vein every 7 (seven) days.  02/08/14   Jessee Avers, MD  ferric gluconate 62.5 mg in sodium chloride 0.9 % 100 mL Inject 62.5 mg into the vein every Thursday with hemodialysis. 03/09/16   Collier Salina, MD  hydrALAZINE (APRESOLINE) 25 MG tablet Take 1 tablet (25 mg total) by mouth 3 (three) times daily. 02/18/17   Holley Raring, MD  hydroxychloroquine (PLAQUENIL) 200 MG tablet Take 2 tablets (400 mg total) by mouth daily. 02/18/17   Holley Raring, MD  isosorbide mononitrate (IMDUR) 30 MG 24 hr tablet Take 1 tablet (30 mg total) by mouth daily. 02/18/17   Holley Raring, MD  multivitamin (RENA-VIT) TABS tablet Take 1 tablet by mouth daily. 02/18/17   Holley Raring, MD  Nutritional Supplements (FEEDING SUPPLEMENT, NEPRO CARB STEADY,) LIQD Take 237 mLs by mouth See admin instructions. At every dialysis treatment on Tues/Thurs/Sat    Historical Provider, MD  penicillin v potassium (VEETID) 500 MG tablet Take 1 tablet (500 mg total) by mouth 4 (four) times daily. 03/24/17 04/03/17  Plessis, MD    temazepam (RESTORIL) 15 MG capsule Take 15 mg by mouth at bedtime. 02/28/16   Historical Provider, MD  triamcinolone cream (KENALOG) 0.1 % Apply 1 application topically 2 (two) times daily. Patient not taking: Reported on 02/16/2017 06/24/15   Lucious Groves, DO      History reviewed. No pertinent family history.   Social History  Substance Use Topics  . Smoking status: Current Some Day Smoker    Packs/day: 0.25    Years: 15.00  . Smokeless tobacco: Never Used     Comment: quit smoking s/p hospital discharge/ SMOKES ABOUT 2-3 CIGARETTES A DAY  . Alcohol use 0.0 oz/week     Comment: occ     Allergies     Tobramycin sulfate    Review of Systems  10 Systems reviewed and are negative for acute change except as noted in the HPI.   Physical Exam Updated Vital Signs BP (!) 158/108   Pulse 78   Temp 98.2 F (36.8 C) (Oral)  Resp 16   SpO2 93%   Physical Exam  Constitutional: She is oriented to person, place, and time. She appears well-developed and well-nourished. No distress.  HENT:  Head: Normocephalic and atraumatic.  Right Ear: Tympanic membrane and ear canal normal.  Left Ear: Tympanic membrane and ear canal normal.  Mouth/Throat:    Teeth #29 and 30 broken to gumline, no swelling or drainage  Eyes: Conjunctivae are normal.  Neck: Neck supple.  Lymphadenopathy:    She has cervical adenopathy (R submandibular).  Neurological: She is alert and oriented to person, place, and time.  Skin: Skin is warm and dry.  Psychiatric: She has a normal mood and affect. Judgment normal.  Nursing note and vitals reviewed.     ED Treatments / Results  Labs (all labs ordered are listed, but only abnormal results are displayed) Labs Reviewed - No data to display   EKG  EKG Interpretation  Date/Time:    Ventricular Rate:    PR Interval:    QRS Duration:   QT Interval:    QTC Calculation:   R Axis:     Text Interpretation:           Radiology No results  found.  Procedures Procedures (including critical care time) .Nerve Block Date/Time: 03/24/2017 1:47 PM Performed by: Sharlett Iles Authorized by: Sharlett Iles   Consent:    Consent obtained:  Verbal   Consent given by:  Patient Indications:    Indications:  Pain relief Location:    Body area:  Head   Head nerve blocked: inferior alveolar.   Laterality:  Right Skin anesthesia (see MAR for exact dosages):    Skin anesthesia method:  None Procedure details (see MAR for exact dosages):    Block needle gauge:  27 G   Anesthetic injected:  Bupivacaine 0.5% WITH epi   Injection procedure:  Anatomic landmarks identified, anatomic landmarks palpated, incremental injection and introduced needle Post-procedure details:    Dressing:  None   Outcome:  Pain relieved   Patient tolerance of procedure:  Tolerated well, no immediate complications    Medications Ordered in ED  Medications  bupivacaine-epinephrine (MARCAINE W/ EPI) 0.5% -1:200000 injection 1.8 mL (1.8 mLs Infiltration Given by Other 03/24/17 1256)     Initial Impression / Assessment and Plan / ED Course  I have reviewed the triage vital signs and the nursing notes.      Pt w/ a few weeks of worsening R lower dental pain. Right lower molars broken to gumline, no obvious gum swelling to suggest large fluid collection but I suspect small periapical abscess given cervical lymphadenopathy. Performed a dental block and started on antibiotics. Pt reported immediate resolution of dental pain. Emphasized the importance of following up with dentist for definitive management. Extensively reviewed return precautions including signs of worsening infection. Patient voiced understanding and was discharged in satisfactory condition.  Final Clinical Impressions(s) / ED Diagnoses   Final diagnoses:  Pain, dental     New Prescriptions   ACETAMINOPHEN (TYLENOL) 500 MG TABLET    Take 2 tablets (1,000 mg total) by mouth  every 6 (six) hours as needed for moderate pain.   PENICILLIN V POTASSIUM (VEETID) 500 MG TABLET    Take 1 tablet (500 mg total) by mouth 4 (four) times daily.       Sharlett Iles, MD 03/24/17 [phone]

## 2017-04-05 ENCOUNTER — Ambulatory Visit: Payer: Medicaid Other

## 2017-04-06 ENCOUNTER — Encounter: Payer: Self-pay | Admitting: Internal Medicine

## 2017-12-25 ENCOUNTER — Emergency Department (HOSPITAL_COMMUNITY)
Admission: EM | Admit: 2017-12-25 | Discharge: 2017-12-25 | Disposition: A | Discharge status: Home / Self Care | Payer: Medicaid Other | Attending: Physician Assistant | Admitting: Physician Assistant

## 2017-12-25 ENCOUNTER — Encounter (HOSPITAL_COMMUNITY): Payer: Self-pay | Admitting: Nurse Practitioner

## 2017-12-25 DIAGNOSIS — I5022 Chronic systolic (congestive) heart failure: Secondary | ICD-10-CM | POA: Diagnosis not present

## 2017-12-25 DIAGNOSIS — F17210 Nicotine dependence, cigarettes, uncomplicated: Secondary | ICD-10-CM | POA: Insufficient documentation

## 2017-12-25 DIAGNOSIS — I132 Hypertensive heart and chronic kidney disease with heart failure and with stage 5 chronic kidney disease, or end stage renal disease: Secondary | ICD-10-CM | POA: Diagnosis not present

## 2017-12-25 DIAGNOSIS — R569 Unspecified convulsions: Secondary | ICD-10-CM | POA: Diagnosis present

## 2017-12-25 DIAGNOSIS — Z992 Dependence on renal dialysis: Secondary | ICD-10-CM | POA: Insufficient documentation

## 2017-12-25 DIAGNOSIS — Z79899 Other long term (current) drug therapy: Secondary | ICD-10-CM | POA: Diagnosis not present

## 2017-12-25 DIAGNOSIS — N186 End stage renal disease: Secondary | ICD-10-CM | POA: Diagnosis not present

## 2017-12-25 LAB — CBC WITH DIFFERENTIAL/PLATELET
Basophils Absolute: 0 10*3/uL (ref 0.0–0.1)
Basophils Relative: 0 %
EOS ABS: 0.1 10*3/uL (ref 0.0–0.7)
Eosinophils Relative: 3 %
HCT: 33.8 % — ABNORMAL LOW (ref 36.0–46.0)
HEMOGLOBIN: 10.7 g/dL — AB (ref 12.0–15.0)
LYMPHS ABS: 0.7 10*3/uL (ref 0.7–4.0)
Lymphocytes Relative: 20 %
MCH: 27.9 pg (ref 26.0–34.0)
MCHC: 31.7 g/dL (ref 30.0–36.0)
MCV: 88 fL (ref 78.0–100.0)
MONO ABS: 0.3 10*3/uL (ref 0.1–1.0)
MONOS PCT: 8 %
NEUTROS PCT: 69 %
Neutro Abs: 2.3 10*3/uL (ref 1.7–7.7)
Platelets: 105 10*3/uL — ABNORMAL LOW (ref 150–400)
RBC: 3.84 MIL/uL — ABNORMAL LOW (ref 3.87–5.11)
RDW: 20.6 % — AB (ref 11.5–15.5)
WBC: 3.3 10*3/uL — ABNORMAL LOW (ref 4.0–10.5)

## 2017-12-25 LAB — COMPREHENSIVE METABOLIC PANEL
ALBUMIN: 2.8 g/dL — AB (ref 3.5–5.0)
ALK PHOS: 61 U/L (ref 38–126)
ALT: 20 U/L (ref 14–54)
AST: 28 U/L (ref 15–41)
Anion gap: 11 (ref 5–15)
BUN: 38 mg/dL — AB (ref 6–20)
CALCIUM: 8.1 mg/dL — AB (ref 8.9–10.3)
CHLORIDE: 93 mmol/L — AB (ref 101–111)
CO2: 30 mmol/L (ref 22–32)
CREATININE: 7.31 mg/dL — AB (ref 0.44–1.00)
GFR calc non Af Amer: 7 mL/min — ABNORMAL LOW (ref >60)
GFR, EST AFRICAN AMERICAN: 8 mL/min — AB (ref >60)
GLUCOSE: 75 mg/dL (ref 65–99)
Potassium: 4.1 mmol/L (ref 3.5–5.1)
SODIUM: 134 mmol/L — AB (ref 135–145)
Total Bilirubin: 0.6 mg/dL (ref 0.3–1.2)
Total Protein: 8.5 g/dL — ABNORMAL HIGH (ref 6.5–8.1)

## 2017-12-25 LAB — CBG MONITORING, ED: GLUCOSE-CAPILLARY: 105 mg/dL — AB (ref 65–99)

## 2017-12-25 NOTE — ED Provider Notes (Signed)
[facility] EMERGENCY DEPARTMENT Provider Note   CSN: [id number] Arrival date & time: 12/25/17  1133     History   Chief Complaint Chief Complaint  Patient presents with  . Seizures    HPI KEERTHANA VANROSSUM is a 36 y.o. female.  HPI   Patient is a 36 year old female end-stage renal disease on dialysis.  Patient has history of lupus.  She is here today for passing out during dialysis.  Apparently they took around 3 L off and found her to discuss she does have shaking episode.  They gave 500 back and she woke up immediately.  Patient has no memory of the event.  She has no symptoms.  She currently feels baseline.  History of seizures only during her pregnancy.  Past Medical History:  Diagnosis Date  . Benign hypertension with ESRD (end-stage renal disease) ([place]) 03/15/2014  . Chronic systolic congestive heart failure ([place]) 03/16/2016  . H/O pericarditis 01/17/2013  . H/O pleural effusion 01/17/2013  . Lupus (systemic lupus erythematosus) (HCC)    Previously followed with Dr. Charlestine Night, has not followed up recently  . 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 Polysubstance abuse ([place])    cocaine, MJ, tobacco  . S/P pericardiocentesis 01/17/2013   H/o pericardial effusion with tamponade 2006     Patient Active Problem List   Diagnosis Date Noted  . ESRD (end stage renal disease) on dialysis ([place]) 02/17/2017  . SOB (shortness of breath) 02/16/2017  . Chronic systolic congestive heart failure ([place]) 03/16/2016  . Cough   . Ascites   . Follow up   . Other hypervolemia   . S/P thoracentesis   . Abdominal pain   . Pleural effusion   . Hemoptysis   . Myalgia   . Pulmonary edema   . Crack cocaine overdose ([place])   . ESRD (end stage renal disease) ([place]) 03/03/2016  . Hypertensive emergency 03/03/2016  . Acute  bronchitis 02/03/2016  . Volume overload 11/16/2015  . Shortness of breath 11/16/2015  . Acute UTI   . Hyperkalemia 09/25/2015  . Rash and nonspecific skin eruption 06/26/2015  . Secondary Raynaud's phenomenon 06/24/2015  . Cramping of hands 04/19/2014  . Unspecified contraceptive management 04/19/2014  . End stage renal disease ([place]) 03/27/2014  . Insomnia 03/15/2014  . Benign hypertension with ESRD (end-stage renal disease) ([place]) 03/15/2014  . Tobacco abuse 02/15/2014  . Healthcare maintenance 02/15/2014  . Hypoalbuminemia 02/01/2014  . Nephrotic syndrome 02/01/2014  . ESRD on dialysis ([place]) 01/31/2014  . Pleural effusion, left 01/31/2014  . Metabolic acidosis, increased anion gap (IAG) 01/31/2014  . Microcytic anemia 01/29/2014  . Hypocalcemia 01/29/2014  . Streptococcal bacteremia 01/23/2013  . Cocaine abuse ([place]) 01/18/2013  . Marijuana smoker ([place]) 01/18/2013  . H/O pericarditis 01/17/2013  . SLE (systemic lupus erythematosus) ([place]) 01/17/2013  . Lupus nephritis ([place]) 01/17/2013  . S/P pericardiocentesis 01/17/2013  . H/O pleural effusion 01/17/2013  . Nephrosis 01/17/2013  . Trichomoniasis 01/17/2013  . Preseptal cellulitis 01/17/2013    Past Surgical History:  Procedure Laterality Date  . BASCILIC VEIN TRANSPOSITION Left 02/05/2014   Procedure: [place];  Surgeon: [name], MD;  Location: [facility];  Service: Vascular;  Laterality: Left;  Marland Kitchen VENOGRAM Right 01/31/2014   Procedure: DIALYSIS CATHETER;  Surgeon: Serafina Mitchell, MD;  Location: [facility] CATH LAB;  Service: Cardiovascular;  Laterality:  Right;    OB History    No data available       Home Medications    Prior to Admission medications   Medication Sig Start Date End Date Taking? Authorizing Provider  albuterol (PROVENTIL HFA;VENTOLIN HFA) 108 (90 Base) MCG/ACT inhaler Inhale 1-2 puffs into the lungs every 6 (six) hours as needed for wheezing or shortness of breath. 02/03/16   Lucious Groves,  DO  calcium acetate (PHOSLO) 667 MG capsule Take 2,001-4,002 mg by mouth See admin instructions. 4,002 mg three times a day with each meal and 2,001 mg with each snack    [provider]  carvedilol (COREG) 3.125 MG tablet Take 1 tablet (3.125 mg total) by mouth 2 (two) times daily with a meal. 02/18/17   Holley Raring, MD  Cholecalciferol (VITAMIN D-3 PO) Take 1 capsule by mouth See admin instructions. Every Tues/Thurs/Sat at dialysis    [provider]  cinacalcet (SENSIPAR) 30 MG tablet Take 30 mg by mouth daily.    [provider]  darbepoetin (ARANESP) 60 MCG/0.3ML SOLN injection Inject 0.3 mLs (60 mcg total) into the vein every Wednesday with hemodialysis. Patient taking differently: Inject 60 mcg into the vein every 7 (seven) days.  02/08/14   Jessee Avers, MD  ferric gluconate 62.5 mg in sodium chloride 0.9 % 100 mL Inject 62.5 mg into the vein every Thursday with hemodialysis. 03/09/16   Rice, Resa Miner, MD  hydrALAZINE (APRESOLINE) 25 MG tablet Take 1 tablet (25 mg total) by mouth 3 (three) times daily. 02/18/17   Holley Raring, MD  hydroxychloroquine (PLAQUENIL) 200 MG tablet Take 2 tablets (400 mg total) by mouth daily. 02/18/17   Holley Raring, MD  isosorbide mononitrate (IMDUR) 30 MG 24 hr tablet Take 1 tablet (30 mg total) by mouth daily. 02/18/17   Holley Raring, MD  multivitamin (RENA-VIT) TABS tablet Take 1 tablet by mouth daily. 02/18/17   Holley Raring, MD  Nutritional Supplements (FEEDING SUPPLEMENT, NEPRO CARB STEADY,) LIQD Take 237 mLs by mouth See admin instructions. At every dialysis treatment on Tues/Thurs/Sat    [provider]  temazepam (RESTORIL) 15 MG capsule Take 15 mg by mouth at bedtime. 02/28/16   [provider]  triamcinolone cream (KENALOG) 0.1 % Apply 1 application topically 2 (two) times daily. Patient not taking: Reported on 02/16/2017 06/24/15   Lucious Groves, DO    Family History No family history on  file.  Social History Social History   Tobacco Use  . Smoking status: Current Some Day Smoker    Packs/day: 0.25    Years: 15.00    Pack years: 3.75  . Smokeless tobacco: Never Used  . Tobacco comment: quit smoking s/p hospital discharge/ SMOKES ABOUT 2-3 CIGARETTES A DAY  Substance Use Topics  . Alcohol use: Yes    Alcohol/week: 0.0 oz    Comment: occ  . Drug use: Yes    Frequency: 5.0 times per week    Types: Marijuana, Cocaine    Comment: last used thanksgiving, per pt 03/03/16     Allergies   Tobramycin sulfate   Review of Systems Review of Systems  Constitutional: Negative for activity change.  Respiratory: Negative for shortness of breath.   Cardiovascular: Negative for chest pain.  Gastrointestinal: Negative for abdominal pain.  Neurological: Positive for seizures.  All other systems reviewed and are negative.    Physical Exam Updated Vital Signs BP 111/84   Pulse 100   Temp 97.9 F (36.6 C) (Oral)  Resp 20   Ht 5\' 4"  (1.626 m)   Wt 54.6 kg (120 lb 5.9 oz)   SpO2 98%   BMI 20.66 kg/m   Physical Exam  Constitutional: She is oriented to person, place, and time. She appears well-developed and well-nourished.  Very well-appearing.  Sitting normally in bed.  HENT:  Head: Normocephalic and atraumatic.  Eyes: Right eye exhibits no discharge. Left eye exhibits no discharge.  Cardiovascular: Normal rate and regular rhythm.  No murmur heard. Pulmonary/Chest: Effort normal and breath sounds normal. She has no wheezes.  Abdominal: Soft. She exhibits no distension. There is no tenderness.  Musculoskeletal:  Fistula.  Neurological: She is oriented to person, place, and time.  Skin: Skin is warm and dry. She is not diaphoretic.  Psychiatric: She has a normal mood and affect.  Nursing note and vitals reviewed.    ED Treatments / Results  Labs (all labs ordered are listed, but only abnormal results are displayed) Labs Reviewed  COMPREHENSIVE METABOLIC  PANEL - Abnormal; Notable for the following components:      Result Value   Sodium 134 (*)    Chloride 93 (*)    BUN 38 (*)    Creatinine, Ser 7.31 (*)    Calcium 8.1 (*)    Total Protein 8.5 (*)    Albumin 2.8 (*)    GFR calc non Af Amer 7 (*)    GFR calc Af Amer 8 (*)    All other components within normal limits  CBC WITH DIFFERENTIAL/PLATELET - Abnormal; Notable for the following components:   WBC 3.3 (*)    RBC 3.84 (*)    Hemoglobin 10.7 (*)    HCT 33.8 (*)    RDW 20.6 (*)    Platelets 105 (*)    All other components within normal limits  CBG MONITORING, ED - Abnormal; Notable for the following components:   Glucose-Capillary 105 (*)    All other components within normal limits    EKG  EKG Interpretation None       Radiology No results found.  Procedures Procedures (including critical care time)  Medications Ordered in ED Medications - No data to display   Initial Impression / Assessment and Plan / ED Course  I have reviewed the triage vital signs and the nursing notes.  Pertinent labs & imaging results that were available during my care of the patient were reviewed by me and considered in my medical decision making (see chart for details).     Patient is a 36 year old female end-stage renal disease on dialysis.  Patient has history of lupus.  She is here today for passing out during dialysis.  Apparently they took around 3 L off and found her to discuss she does have shaking episode.  They gave 500 back and she woke up immediately.  Patient has no memory of the event.  She has no symptoms.  She currently feels baseline.  History of seizures only during her pregnancy.  1:40 PM Will run patient's labs, otherwise with normal vital signs normal physical exam, it is unclear what additional workup would entail.  Will have patient stay away from driving.  Follow-up with neurology.  Will touch base with nephrology make sure they do not want anything else  done.   4:07 PM Discussed with Dr. Jonnie Finner from nephrology, he agrees this is likely not seizure.  And he does not require any further workup.  He feels comfortable with discharging patient home given the normal vital  signs, normal physical exam and baseline labs. Final Clinical Impressions(s) / ED Diagnoses   Final diagnoses:  None    ED Discharge Orders    None       Malanie Koloski, Fredia Sorrow, MD 12/25/17 1607

## 2017-12-25 NOTE — ED Notes (Signed)
Called lab to follow up on CMP, states they will release results shortly.

## 2017-12-25 NOTE — ED Notes (Signed)
Pt provided with [place] sandwich and beverage, okay'd by MD

## 2017-12-25 NOTE — ED Notes (Signed)
EDP updated patient

## 2017-12-25 NOTE — Discharge Instructions (Signed)
We do not think that you had a seizure today.  However just in case we do not want you to drive a car until you are evaluated by your primary care, your nephrologist, or a neurologist.

## 2017-12-25 NOTE — ED Triage Notes (Signed)
Per EMS pt from dialysis- pulled of 2.2L of fluid and put back 586mL of fluid after patient had seizure like activity witnessed by staff. Staff noted full body jerking lasting approximately 2 min. Upon EMS arrival pt was alert and oriented, no injuries noted.

## 2017-12-25 NOTE — ED Notes (Signed)
IV team at bedside 

## 2018-01-01 ENCOUNTER — Other Ambulatory Visit: Payer: Self-pay

## 2018-01-01 ENCOUNTER — Emergency Department (HOSPITAL_COMMUNITY): Payer: Medicaid Other

## 2018-01-01 ENCOUNTER — Emergency Department (HOSPITAL_COMMUNITY)
Admission: EM | Admit: 2018-01-01 | Discharge: 2018-01-01 | Disposition: A | Discharge status: Home / Self Care | Payer: Medicaid Other | Attending: Emergency Medicine | Admitting: Emergency Medicine

## 2018-01-01 DIAGNOSIS — R55 Syncope and collapse: Secondary | ICD-10-CM | POA: Diagnosis present

## 2018-01-01 DIAGNOSIS — S0083XA Contusion of other part of head, initial encounter: Secondary | ICD-10-CM | POA: Insufficient documentation

## 2018-01-01 DIAGNOSIS — I132 Hypertensive heart and chronic kidney disease with heart failure and with stage 5 chronic kidney disease, or end stage renal disease: Secondary | ICD-10-CM | POA: Insufficient documentation

## 2018-01-01 DIAGNOSIS — S0990XA Unspecified injury of head, initial encounter: Secondary | ICD-10-CM | POA: Insufficient documentation

## 2018-01-01 DIAGNOSIS — W19XXXA Unspecified fall, initial encounter: Secondary | ICD-10-CM | POA: Diagnosis not present

## 2018-01-01 DIAGNOSIS — N186 End stage renal disease: Secondary | ICD-10-CM | POA: Insufficient documentation

## 2018-01-01 DIAGNOSIS — I5022 Chronic systolic (congestive) heart failure: Secondary | ICD-10-CM | POA: Diagnosis not present

## 2018-01-01 DIAGNOSIS — F1410 Cocaine abuse, uncomplicated: Secondary | ICD-10-CM | POA: Insufficient documentation

## 2018-01-01 DIAGNOSIS — Z7982 Long term (current) use of aspirin: Secondary | ICD-10-CM | POA: Insufficient documentation

## 2018-01-01 DIAGNOSIS — Z992 Dependence on renal dialysis: Secondary | ICD-10-CM | POA: Diagnosis not present

## 2018-01-01 DIAGNOSIS — Z79899 Other long term (current) drug therapy: Secondary | ICD-10-CM | POA: Insufficient documentation

## 2018-01-01 DIAGNOSIS — M25511 Pain in right shoulder: Secondary | ICD-10-CM | POA: Diagnosis not present

## 2018-01-01 DIAGNOSIS — F1210 Cannabis abuse, uncomplicated: Secondary | ICD-10-CM | POA: Diagnosis not present

## 2018-01-01 DIAGNOSIS — I951 Orthostatic hypotension: Secondary | ICD-10-CM

## 2018-01-01 DIAGNOSIS — Y92531 Health care provider office as the place of occurrence of the external cause: Secondary | ICD-10-CM | POA: Diagnosis not present

## 2018-01-01 DIAGNOSIS — F17210 Nicotine dependence, cigarettes, uncomplicated: Secondary | ICD-10-CM | POA: Insufficient documentation

## 2018-01-01 DIAGNOSIS — Y9389 Activity, other specified: Secondary | ICD-10-CM | POA: Insufficient documentation

## 2018-01-01 DIAGNOSIS — Y998 Other external cause status: Secondary | ICD-10-CM | POA: Insufficient documentation

## 2018-01-01 DIAGNOSIS — T148XXA Other injury of unspecified body region, initial encounter: Secondary | ICD-10-CM

## 2018-01-01 LAB — CBC
HEMATOCRIT: 35.6 % — AB (ref 36.0–46.0)
Hemoglobin: 10.8 g/dL — ABNORMAL LOW (ref 12.0–15.0)
MCH: 26.7 pg (ref 26.0–34.0)
MCHC: 30.3 g/dL (ref 30.0–36.0)
MCV: 88.1 fL (ref 78.0–100.0)
PLATELETS: 241 10*3/uL (ref 150–400)
RBC: 4.04 MIL/uL (ref 3.87–5.11)
RDW: 18.9 % — AB (ref 11.5–15.5)
WBC: 3.7 10*3/uL — AB (ref 4.0–10.5)

## 2018-01-01 LAB — BASIC METABOLIC PANEL
Anion gap: 10 (ref 5–15)
BUN: 25 mg/dL — AB (ref 6–20)
CO2: 31 mmol/L (ref 22–32)
CREATININE: 6.98 mg/dL — AB (ref 0.44–1.00)
Calcium: 8.8 mg/dL — ABNORMAL LOW (ref 8.9–10.3)
Chloride: 93 mmol/L — ABNORMAL LOW (ref 101–111)
GFR calc Af Amer: 8 mL/min — ABNORMAL LOW (ref >60)
GFR, EST NON AFRICAN AMERICAN: 7 mL/min — AB (ref >60)
Glucose, Bld: 90 mg/dL (ref 65–99)
POTASSIUM: 4.1 mmol/L (ref 3.5–5.1)
SODIUM: 134 mmol/L — AB (ref 135–145)

## 2018-01-01 LAB — CBG MONITORING, ED: Glucose-Capillary: 97 mg/dL (ref 65–99)

## 2018-01-01 LAB — I-STAT BETA HCG BLOOD, ED (MC, WL, AP ONLY)

## 2018-01-01 NOTE — Progress Notes (Signed)
IV Team Note;   Pt brought to ER from dialysis center with both needles to left upper arm graft/fistula;  Found pt with "diaper" wrapped around her arm due to bleeding; blood noted on her gown and sheets;  Found the "top" needle to be at least halfway out of the fistula; clotted blood around each of the needles on her arm;   Extra pressure held to each of the areas once needles were removed due to bleeding;   Raynelle Fanning RN IV Team

## 2018-01-01 NOTE — ED Provider Notes (Signed)
I received this patient in signout from Dr. Johnney Killian. She had presented after syncopal episode during which she hit her head and we were awaiting head CT.  Head CT shows no acute intracranial injury, only forehead hematoma noted.  On exam, she was resting comfortably, large hematoma on right forehead.  No other complaints.  I discussed CT findings with her and supportive measures.  Extensively reviewed return precautions including vomiting, severe headache, problems walking, vision changes, or any sudden new complaints.  She voiced understanding and was discharged in satisfactory condition.     Russ Looper, Wenda Overland, MD 01/01/18 [id number]

## 2018-01-01 NOTE — ED Provider Notes (Signed)
[facility] EMERGENCY DEPARTMENT Provider Note   CSN: [id number] Arrival date & time: 01/01/18  1226     History   Chief Complaint Chief Complaint  Patient presents with  . Loss of Consciousness    HPI [name] is a 37 y.o. female.  HPI Patient reports she had completed her dialysis session and they were doing her post session vital signs.  She reports she was still hypotensive at the end of the session.  She reports she saw a systolic pressure of 87.  She reports that when she stood she got dizzy and passed out.  She hit her forehead and she thinks she hit her shoulder on the right because it is starting to get sore now.  She has not had nausea or vomiting.  No visual changes.  No focal weakness numbness or tingling.  Patient is not on any oral anticoagulants. Past Medical History:  Diagnosis Date  . Benign hypertension with ESRD (end-stage renal disease) ([place]) 03/15/2014  . Chronic systolic congestive heart failure ([place]) 03/16/2016  . H/O pericarditis 01/17/2013  . H/O pleural effusion 01/17/2013  . Lupus (systemic lupus erythematosus) (H[facility])    Previously followed with Dr. Charlestine Night, has not followed up recently  . 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 Polysubstance abuse ([place])    cocaine, MJ, tobacco  . S/P pericardiocentesis 01/17/2013   H/o pericardial effusion with tamponade 2006     Patient Active Problem List   Diagnosis Date Noted  . ESRD (end stage renal disease) on dialysis ([place]) 02/17/2017  . SOB (shortness of breath) 02/16/2017  . Chronic systolic congestive heart failure ([employer]) 03/16/2016  . Cough   . Ascites   . Follow up   . Other hypervolemia   . S/P thoracentesis   . Abdominal pain   . Pleural effusion   . Hemoptysis   . Myalgia   . Pulmonary edema   . Crack cocaine overdose ([place])     . ESRD (end stage renal disease) ([place]) 03/03/2016  . Hypertensive emergency 03/03/2016  . Acute bronchitis 02/03/2016  . Volume overload 11/16/2015  . Shortness of breath 11/16/2015  . Acute UTI   . Hyperkalemia 09/25/2015  . Rash and nonspecific skin eruption 06/26/2015  . Secondary Raynaud's phenomenon 06/24/2015  . Cramping of hands 04/19/2014  . Unspecified contraceptive management 04/19/2014  . End stage renal disease ([place]) 03/27/2014  . Insomnia 03/15/2014  . Benign hypertension with ESRD (end-stage renal disease) ([place]) 03/15/2014  . Tobacco abuse 02/15/2014  . Healthcare maintenance 02/15/2014  . Hypoalbuminemia 02/01/2014  . Nephrotic syndrome 02/01/2014  . ESRD on dialysis ([place]) 01/31/2014  . Pleural effusion, left 01/31/2014  . Metabolic acidosis, increased anion gap (IAG) 01/31/2014  . Microcytic anemia 01/29/2014  . Hypocalcemia 01/29/2014  . Streptococcal bacteremia 01/23/2013  . Cocaine abuse ([place]) 01/18/2013  . Marijuana smoker ([place]) 01/18/2013  . H/O pericarditis 01/17/2013  . SLE (systemic lupus erythematosus) ([place]) 01/17/2013  . Lupus nephritis ([place]) 01/17/2013  . S/P pericardiocentesis 01/17/2013  . H/O pleural effusion 01/17/2013  . Nephrosis 01/17/2013  . Trichomoniasis 01/17/2013  . Preseptal cellulitis 01/17/2013    Past Surgical History:  Procedure Laterality Date  . BASCILIC VEIN TRANSPOSITION Left 02/05/2014   Procedure: [place];  Surgeon: [name], MD;  Location: [facility];  Service: Vascular;  Laterality: Left;  Marland Kitchen VENOGRAM  Right 01/31/2014   Procedure: DIALYSIS CATHETER;  Surgeon: Serafina Mitchell, MD;  Location: [facility] CATH LAB;  Service: Cardiovascular;  Laterality: Right;    OB History    No data available       Home Medications    Prior to Admission medications   Medication Sig Start Date End Date Taking? Authorizing Provider  aspirin EC 81 MG tablet Take 81 mg by mouth daily.   Yes [provider]  calcium  acetate (PHOSLO) 667 MG capsule Take 2,001-4,002 mg by mouth See admin instructions. 4,002 mg three times a day with each meal and 2,001 mg with each snack   Yes [provider]  carvedilol (COREG) 3.125 MG tablet Take 1 tablet (3.125 mg total) by mouth 2 (two) times daily with a meal. 02/18/17  Yes Holley Raring, MD  Cholecalciferol (VITAMIN D-3 PO) Take 1 capsule by mouth See admin instructions. Every Tues/Thurs/Sat at dialysis   Yes [provider]  cinacalcet (SENSIPAR) 30 MG tablet Take 30 mg by mouth daily.   Yes [provider]  diphenhydrAMINE (BENADRYL) 50 MG tablet Take 50 mg by mouth every 6 (six) hours as needed for itching.   Yes [provider]  ferric gluconate 62.5 mg in sodium chloride 0.9 % 100 mL Inject 62.5 mg into the vein every Thursday with hemodialysis. Patient taking differently: Inject 62.5 mg into the vein every Saturday with hemodialysis.  03/09/16  Yes Rice, Resa Miner, MD  hydrALAZINE (APRESOLINE) 25 MG tablet Take 1 tablet (25 mg total) by mouth 3 (three) times daily. 02/18/17  Yes Holley Raring, MD  hydroxychloroquine (PLAQUENIL) 200 MG tablet Take 2 tablets (400 mg total) by mouth daily. 02/18/17  Yes Holley Raring, MD  isosorbide mononitrate (IMDUR) 30 MG 24 hr tablet Take 1 tablet (30 mg total) by mouth daily. 02/18/17  Yes Holley Raring, MD  loperamide (IMODIUM) 2 MG capsule Take 4 mg by mouth as needed for diarrhea or loose stools.   Yes [provider]  Nutritional Supplements (FEEDING SUPPLEMENT, NEPRO CARB STEADY,) LIQD Take 237 mLs by mouth See admin instructions. At every dialysis treatment on Tues/Thurs/Sat   Yes [provider]  albuterol (PROVENTIL HFA;VENTOLIN HFA) 108 (90 Base) MCG/ACT inhaler Inhale 1-2 puffs into the lungs every 6 (six) hours as needed for wheezing or shortness of breath. Patient not taking: Reported on 12/25/2017 02/03/16   Lucious Groves, DO  darbepoetin (ARANESP) 60 MCG/0.3ML SOLN  injection Inject 0.3 mLs (60 mcg total) into the vein every Wednesday with hemodialysis. Patient taking differently: Inject 60 mcg into the vein every 7 (seven) days.  02/08/14   Jessee Avers, MD  multivitamin (RENA-VIT) TABS tablet Take 1 tablet by mouth daily. Patient not taking: Reported on 01/01/2018 02/18/17   Holley Raring, MD  triamcinolone cream (KENALOG) 0.1 % Apply 1 application topically 2 (two) times daily. Patient not taking: Reported on 02/16/2017 06/24/15   Lucious Groves, DO    Family History No family history on file.  Social History Social History   Tobacco Use  . Smoking status: Current Some Day Smoker    Packs/day: 0.25    Years: 15.00    Pack years: 3.75  . Smokeless tobacco: Never Used  . Tobacco comment: quit smoking s/p hospital discharge/ SMOKES ABOUT 2-3 CIGARETTES A DAY  Substance Use Topics  . Alcohol use: Yes    Alcohol/week: 0.0 oz    Comment: occ  . Drug use: Yes    Frequency: 5.0  times per week    Types: Marijuana, Cocaine    Comment: last used thanksgiving, per pt 03/03/16     Allergies   Tobramycin sulfate   Review of Systems Review of Systems 10 Systems reviewed and are negative for acute change except as noted in the HPI.   Physical Exam Updated Vital Signs BP (!) 131/96   Pulse 95   Resp (!) 25   Ht 5\' 4"  (1.626 m)   Wt 50 kg (110 lb 3.7 oz)   SpO2 97%   BMI 18.92 kg/m   Physical Exam  Constitutional: She is oriented to person, place, and time.  Patient is alert and nontoxic.  No respiratory distress.  Clinically well in appearance.  Thin.  HENT:  Nose: Nose normal.  Mouth/Throat: Oropharynx is clear and moist.  Patient has a 4 cm hematoma to the right forehead.  No laceration or abrasion.  Eyes: EOM are normal. Pupils are equal, round, and reactive to light.  Neck: Neck supple.  Cardiovascular: Normal rate, regular rhythm, normal heart sounds and intact distal pulses.  Pulmonary/Chest: Effort normal and breath sounds  normal.  Abdominal: Soft. She exhibits no distension. There is no tenderness.  Musculoskeletal:  She has some tenderness over the point of the right shoulder.  Some tenderness at the [facility] joint.  Range of motion is normal.  No deformity.  Lower extremities normal range of motion no deformities.  Patient endorses some mild tenderness to the medial aspect of the right foot.  Neurological: She is alert and oriented to person, place, and time. No cranial nerve deficit. She exhibits normal muscle tone. Coordination normal.  Skin: Skin is warm and dry.  Psychiatric: She has a normal mood and affect.     ED Treatments / Results  Labs (all labs ordered are listed, but only abnormal results are displayed) Labs Reviewed  BASIC METABOLIC PANEL - Abnormal; Notable for the following components:      Result Value   Sodium 134 (*)    Chloride 93 (*)    BUN 25 (*)    Creatinine, Ser 6.98 (*)    Calcium 8.8 (*)    GFR calc non Af Amer 7 (*)    GFR calc Af Amer 8 (*)    All other components within normal limits  CBC - Abnormal; Notable for the following components:   WBC 3.7 (*)    Hemoglobin 10.8 (*)    HCT 35.6 (*)    RDW 18.9 (*)    All other components within normal limits  CBG MONITORING, ED  I-STAT BETA HCG BLOOD, ED ([facility], [facility], [facility] ONLY)    EKG  EKG Interpretation  Date/Time:  Saturday January 01 2018 12:38:49 EST Ventricular Rate:  102 PR Interval:    QRS Duration: 87 QT Interval:  375 QTC Calculation: 489 R Axis:   81 Text Interpretation:  Sinus tachycardia Biatrial enlargement Probable left ventricular hypertrophy Nonspecific T abnormalities, lateral leads Borderline prolonged QT interval No significant change since last tracing Confirmed by Theotis Burrow [phone]) on 01/01/2018 4:02:46 PM       Radiology No results found.  Procedures Procedures (including critical care time)  Medications Ordered in ED Medications - No data to display   Initial Impression / Assessment and  Plan / ED Course  I have reviewed the triage vital signs and the nursing notes.  Pertinent labs & imaging results that were available during my care of the patient were reviewed by me and considered in  my medical decision making (see chart for details).      Final Clinical Impressions(s) / ED Diagnoses   Final diagnoses:  Orthostatic syncope  ESRD (end stage renal disease) on dialysis ([place])  Hematoma  Injury of head, initial encounter  Is clinically alert and well.  Plan will be for follow-up of CT head by Dr. Rex Kras with anticipated discharge.  Patient's blood pressures have stabilized and she has no focal neurologic deficit.  It appears she was hypovolemic and orthostatic after dialysis.  No signs of infectious etiology history is not suggestive of acute cardiopulmonary event.  At this time, I feel patient is stable for discharge if CT head shows no intracranial injury.  ED Discharge Orders    None       Charlesetta Shanks, MD 01/02/18 1113

## 2018-01-01 NOTE — ED Notes (Signed)
ED Provider at bedside. 

## 2018-01-01 NOTE — ED Triage Notes (Signed)
Pt BIB EMS from dialysis where pt had syncopal episode. Pt had finished dialysis, when she stood up, felt dizzy, and fell. Pt reports LOC, hematoma to forehead, no bleeding noted. Pt a&ox4; resp e/u; nad.   Pt still accessed from dialysis; IV team consult already placed.

## 2018-01-01 NOTE — ED Notes (Signed)
IV team at bedside to deaccess dialysis graft.

## 2018-03-23 ENCOUNTER — Encounter: Payer: Self-pay | Admitting: *Deleted

## 2018-03-23 ENCOUNTER — Encounter: Payer: Self-pay | Admitting: Vascular Surgery

## 2018-03-23 ENCOUNTER — Ambulatory Visit (INDEPENDENT_AMBULATORY_CARE_PROVIDER_SITE_OTHER): Payer: Medicaid Other | Admitting: Vascular Surgery

## 2018-03-23 ENCOUNTER — Other Ambulatory Visit: Payer: Self-pay | Admitting: *Deleted

## 2018-03-23 VITALS — BP 147/100 | HR 96 | Temp 97.5°F | Resp 18 | Ht 64.0 in | Wt 111.0 lb

## 2018-03-23 DIAGNOSIS — Z992 Dependence on renal dialysis: Secondary | ICD-10-CM

## 2018-03-23 DIAGNOSIS — N186 End stage renal disease: Secondary | ICD-10-CM | POA: Diagnosis not present

## 2018-03-23 NOTE — H&P (View-Only) (Signed)
Patient name: [name] MRN: [id number] DOB: 1981-08-24 Sex: female   REASON FOR CONSULT:    Bleeding after hemodialysis.  Consider plication of the aneurysm.  The consult is requested by Dr. Pearson Grippe.  HPI:   [name] is a pleasant 37 y.o. female, who had a left basilic vein transposition placed in 2015.  She dialyzes on Tuesdays Thursdays and Saturdays.  Recently she has had bleeding from cannulation at 1 of the sites.  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  She denies any uremic symptoms.  Specifically she denies nausea, vomiting, fatigue, anorexia, or palpitations.  She is on aspirin.  Past Medical History:  Diagnosis Date  . 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) 03/15/2014  . Chronic systolic congestive heart failure ([place]) 03/16/2016  . H/O pericarditis 01/17/2013  . H/O pleural effusion 01/17/2013  . Lupus (systemic lupus erythematosus) ([facility])    Previously followed with Dr. Charlestine Night, has not followed up recently  . 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 Polysubstance abuse ([place])    cocaine, MJ, tobacco  . S/P pericardiocentesis 01/17/2013   H/o pericardial effusion with tamponade 2006     History reviewed. No pertinent family history.  SOCIAL HISTORY: Social History   Socioeconomic History  . Marital status: Single    Spouse name: Not on file  . Number of children: Not on file  . Years of education: Not on file  . Highest education level: Not on file  Occupational History  . Not on file  Social Needs  . Financial resource strain: Not on file  . Food insecurity:    Worry: Not on file    Inability: Not on file  . Transportation needs:    Medical: Not on file    Non-medical: Not on file  Tobacco Use  . Smoking  status: Current Some Day Smoker    Packs/day: 0.25    Years: 15.00    Pack years: 3.75  . Smokeless tobacco: Never Used  . Tobacco comment: quit smoking s/p hospital discharge/ SMOKES ABOUT 2-3 CIGARETTES A DAY  Substance and Sexual Activity  . Alcohol use: Yes    Alcohol/week: 0.0 oz    Comment: occ  . Drug use: Yes    Frequency: 5.0 times per week    Types: Marijuana, Cocaine    Comment: last used thanksgiving, per pt 03/03/16  . Sexual activity: Yes  Lifestyle  . Physical activity:    Days per week: Not on file    Minutes per session: Not on file  . Stress: Not on file  Relationships  . Social connections:    Talks on phone: Not on file    Gets together: Not on file    Attends religious service: Not on file    Active member of club or organization: Not on file    Attends meetings of clubs or organizations: Not on file    Relationship status: Not on file  . Intimate partner violence:    Fear of current or ex partner: Not on file    Emotionally abused: Not on file    Physically abused: Not on file    Forced sexual activity: Not on file  Other Topics Concern  . Not on file  Social History Narrative  . Not  on file    Allergies  Allergen Reactions  . Tobramycin Sulfate Swelling    Current Outpatient Medications  Medication Sig Dispense Refill  . calcium acetate (PHOSLO) 667 MG capsule Take 2,001-4,002 mg by mouth See admin instructions. 4,002 mg three times a day with each meal and 2,001 mg with each snack    . darbepoetin (ARANESP) 60 MCG/0.3ML SOLN injection Inject 0.3 mLs (60 mcg total) into the vein every Wednesday with hemodialysis. (Patient taking differently: Inject 60 mcg into the vein every 7 (seven) days. ) 4.2 mL 0  . ferric gluconate 62.5 mg in sodium chloride 0.9 % 100 mL Inject 62.5 mg into the vein every Thursday with hemodialysis. (Patient taking differently: Inject 62.5 mg into the vein every Saturday with hemodialysis. )    . albuterol (PROVENTIL  HFA;VENTOLIN HFA) 108 (90 Base) MCG/ACT inhaler Inhale 1-2 puffs into the lungs every 6 (six) hours as needed for wheezing or shortness of breath. (Patient not taking: Reported on 12/25/2017) 6.7 g 0  . aspirin EC 81 MG tablet Take 81 mg by mouth daily.    . carvedilol (COREG) 3.125 MG tablet Take 1 tablet (3.125 mg total) by mouth 2 (two) times daily with a meal. (Patient not taking: Reported on 03/23/2018) 60 tablet 2  . Cholecalciferol (VITAMIN D-3 PO) Take 1 capsule by mouth See admin instructions. Every Tues/Thurs/Sat at dialysis    . cinacalcet (SENSIPAR) 30 MG tablet Take 30 mg by mouth. Pt states she takes at dialysis sometimes "when they want to give it to me"    . diphenhydrAMINE (BENADRYL) 50 MG tablet Take 50 mg by mouth every 6 (six) hours as needed for itching.    . hydrALAZINE (APRESOLINE) 25 MG tablet Take 1 tablet (25 mg total) by mouth 3 (three) times daily. (Patient not taking: Reported on 03/23/2018) 90 tablet 2  . hydroxychloroquine (PLAQUENIL) 200 MG tablet Take 2 tablets (400 mg total) by mouth daily. 60 tablet 2  . isosorbide mononitrate (IMDUR) 30 MG 24 hr tablet Take 1 tablet (30 mg total) by mouth daily. (Patient not taking: Reported on 03/23/2018) 30 tablet 2  . loperamide (IMODIUM) 2 MG capsule Take 4 mg by mouth as needed for diarrhea or loose stools.    . multivitamin (RENA-VIT) TABS tablet Take 1 tablet by mouth daily. (Patient not taking: Reported on 01/01/2018) 30 tablet 0  . Nutritional Supplements (FEEDING SUPPLEMENT, NEPRO CARB STEADY,) LIQD Take 237 mLs by mouth See admin instructions. At every dialysis treatment on Tues/Thurs/Sat    . triamcinolone cream (KENALOG) 0.1 % Apply 1 application topically 2 (two) times daily. (Patient not taking: Reported on 02/16/2017) 30 g 0   No current facility-administered medications for this visit.     REVIEW OF SYSTEMS:  [X]  denotes positive finding, [ ]  denotes negative finding Cardiac  Comments:  Chest pain or chest pressure:  x   Shortness of breath upon exertion: x   Short of breath when lying flat: x   Irregular heart rhythm: x       Vascular    Pain in calf, thigh, or hip brought on by ambulation: x   Pain in feet at night that wakes you up from your sleep:  x   Blood clot in your veins:    Leg swelling:         Pulmonary    Oxygen at home:    Productive cough:  x   Wheezing:  Neurologic    Sudden weakness in arms or legs:     Sudden numbness in arms or legs:     Sudden onset of difficulty speaking or slurred speech:    Temporary loss of vision in one eye:     Problems with dizziness:         Gastrointestinal    Blood in stool:     Vomited blood:         Genitourinary    Burning when urinating:     Blood in urine:        Psychiatric    Major depression:         Hematologic    Bleeding problems:    Problems with blood clotting too easily:        Skin    Rashes or ulcers:        Constitutional    Fever or chills:     PHYSICAL EXAM:   Vitals:   03/23/18 0910 03/23/18 0911  BP: (!) 149/104 (!) 147/100  Pulse: 96   Resp: 18   Temp: (!) 97.5 F (36.4 C)   TempSrc: Oral   Weight: 111 lb (50.3 kg)   Height: 5\' 4"  (1.626 m)     GENERAL: The patient is a well-nourished female, in no acute distress. The vital signs are documented above. CARDIAC: There is a regular rate and rhythm.  VASCULAR: Her fistula has a good thrill.  It is very aneurysmal throughout however.  The skin is somewhat thinned on the aneurysm that is more centrally located.  There is some calcium in the vein near the axilla. PULMONARY: There is good air exchange bilaterally without wheezing or rales. MUSCULOSKELETAL: There are no major deformities or cyanosis. NEUROLOGIC: No focal weakness or paresthesias are detected. SKIN: There are no ulcers or rashes noted. PSYCHIATRIC: The patient has a normal affect.  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 However we would like to try to get as much more mileage out of this as we can.  I would recommend plicating and addressing the more centrally located aneurysm.  I think there is still room to get 2 needles below that.  Once this can be used we could potentially consider plicating the proximal fistula which also was quite aneurysmal.  I have explained that at some point we will need to consider new access.  She dialyzes on Tuesdays Thursdays and Saturdays and the procedure has been scheduled for Monday, 03/28/2018.    Deitra Mayo [facility] [phone]

## 2018-03-23 NOTE — Progress Notes (Signed)
Patient name: [name] MRN: [id number] DOB: Mar 20, 1981 Sex: female   REASON FOR CONSULT:    Bleeding after hemodialysis.  Consider plication of the aneurysm.  The consult is requested by Dr. Pearson Grippe.  HPI:   [name] is a pleasant 37 y.o. female, who had a left basilic vein transposition placed in 2015.  She dialyzes on Tuesdays Thursdays and Saturdays.  Recently she has had bleeding from cannulation at 1 of the sites.  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  She denies any uremic symptoms.  Specifically she denies nausea, vomiting, fatigue, anorexia, or palpitations.  She is on aspirin.  Past Medical History:  Diagnosis Date  . 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 Chronic systolic congestive heart failure ([place]) 03/16/2016  . H/O pericarditis 01/17/2013  . H/O pleural effusion 01/17/2013  . Lupus (systemic lupus erythematosus) ([facility])    Previously followed with Dr. Charlestine Night, has not followed up recently  . 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 Polysubstance abuse ([place])    cocaine, MJ, tobacco  . S/P pericardiocentesis 01/17/2013   H/o pericardial effusion with tamponade 2006     History reviewed. No pertinent family history.  SOCIAL HISTORY: Social History   Socioeconomic History  . Marital status: Single    Spouse name: Not on file  . Number of children: Not on file  . Years of education: Not on file  . Highest education level: Not on file  Occupational History  . Not on file  Social Needs  . Financial resource strain: Not on file  . Food insecurity:    Worry: Not on file    Inability: Not on file  . Transportation needs:    Medical: Not on file    Non-medical: Not on file  Tobacco Use  . Smoking  status: Current Some Day Smoker    Packs/day: 0.25    Years: 15.00    Pack years: 3.75  . Smokeless tobacco: Never Used  . Tobacco comment: quit smoking s/p hospital discharge/ SMOKES ABOUT 2-3 CIGARETTES A DAY  Substance and Sexual Activity  . Alcohol use: Yes    Alcohol/week: 0.0 oz    Comment: occ  . Drug use: Yes    Frequency: 5.0 times per week    Types: Marijuana, Cocaine    Comment: last used thanksgiving, per pt 03/03/16  . Sexual activity: Yes  Lifestyle  . Physical activity:    Days per week: Not on file    Minutes per session: Not on file  . Stress: Not on file  Relationships  . Social connections:    Talks on phone: Not on file    Gets together: Not on file    Attends religious service: Not on file    Active member of club or organization: Not on file    Attends meetings of clubs or organizations: Not on file    Relationship status: Not on file  . Intimate partner violence:    Fear of current or ex partner: Not on file    Emotionally abused: Not on file    Physically abused: Not on file    Forced sexual activity: Not on file  Other Topics Concern  . Not on file  Social History Narrative  . Not  on file    Allergies  Allergen Reactions  . Tobramycin Sulfate Swelling    Current Outpatient Medications  Medication Sig Dispense Refill  . calcium acetate (PHOSLO) 667 MG capsule Take 2,001-4,002 mg by mouth See admin instructions. 4,002 mg three times a day with each meal and 2,001 mg with each snack    . darbepoetin (ARANESP) 60 MCG/0.3ML SOLN injection Inject 0.3 mLs (60 mcg total) into the vein every Wednesday with hemodialysis. (Patient taking differently: Inject 60 mcg into the vein every 7 (seven) days. ) 4.2 mL 0  . ferric gluconate 62.5 mg in sodium chloride 0.9 % 100 mL Inject 62.5 mg into the vein every Thursday with hemodialysis. (Patient taking differently: Inject 62.5 mg into the vein every Saturday with hemodialysis. )    . albuterol (PROVENTIL  HFA;VENTOLIN HFA) 108 (90 Base) MCG/ACT inhaler Inhale 1-2 puffs into the lungs every 6 (six) hours as needed for wheezing or shortness of breath. (Patient not taking: Reported on 12/25/2017) 6.7 g 0  . aspirin EC 81 MG tablet Take 81 mg by mouth daily.    . carvedilol (COREG) 3.125 MG tablet Take 1 tablet (3.125 mg total) by mouth 2 (two) times daily with a meal. (Patient not taking: Reported on 03/23/2018) 60 tablet 2  . Cholecalciferol (VITAMIN D-3 PO) Take 1 capsule by mouth See admin instructions. Every Tues/Thurs/Sat at dialysis    . cinacalcet (SENSIPAR) 30 MG tablet Take 30 mg by mouth. Pt states she takes at dialysis sometimes "when they want to give it to me"    . diphenhydrAMINE (BENADRYL) 50 MG tablet Take 50 mg by mouth every 6 (six) hours as needed for itching.    . hydrALAZINE (APRESOLINE) 25 MG tablet Take 1 tablet (25 mg total) by mouth 3 (three) times daily. (Patient not taking: Reported on 03/23/2018) 90 tablet 2  . hydroxychloroquine (PLAQUENIL) 200 MG tablet Take 2 tablets (400 mg total) by mouth daily. 60 tablet 2  . isosorbide mononitrate (IMDUR) 30 MG 24 hr tablet Take 1 tablet (30 mg total) by mouth daily. (Patient not taking: Reported on 03/23/2018) 30 tablet 2  . loperamide (IMODIUM) 2 MG capsule Take 4 mg by mouth as needed for diarrhea or loose stools.    . multivitamin (RENA-VIT) TABS tablet Take 1 tablet by mouth daily. (Patient not taking: Reported on 01/01/2018) 30 tablet 0  . Nutritional Supplements (FEEDING SUPPLEMENT, NEPRO CARB STEADY,) LIQD Take 237 mLs by mouth See admin instructions. At every dialysis treatment on Tues/Thurs/Sat    . triamcinolone cream (KENALOG) 0.1 % Apply 1 application topically 2 (two) times daily. (Patient not taking: Reported on 02/16/2017) 30 g 0   No current facility-administered medications for this visit.     REVIEW OF SYSTEMS:  [X]  denotes positive finding, [ ]  denotes negative finding Cardiac  Comments:  Chest pain or chest pressure:  x   Shortness of breath upon exertion: x   Short of breath when lying flat: x   Irregular heart rhythm: x       Vascular    Pain in calf, thigh, or hip brought on by ambulation: x   Pain in feet at night that wakes you up from your sleep:  x   Blood clot in your veins:    Leg swelling:         Pulmonary    Oxygen at home:    Productive cough:  x   Wheezing:  Neurologic    Sudden weakness in arms or legs:     Sudden numbness in arms or legs:     Sudden onset of difficulty speaking or slurred speech:    Temporary loss of vision in one eye:     Problems with dizziness:         Gastrointestinal    Blood in stool:     Vomited blood:         Genitourinary    Burning when urinating:     Blood in urine:        Psychiatric    Major depression:         Hematologic    Bleeding problems:    Problems with blood clotting too easily:        Skin    Rashes or ulcers:        Constitutional    Fever or chills:     PHYSICAL EXAM:   Vitals:   03/23/18 0910 03/23/18 0911  BP: (!) 149/104 (!) 147/100  Pulse: 96   Resp: 18   Temp: (!) 97.5 F (36.4 C)   TempSrc: Oral   Weight: 111 lb (50.3 kg)   Height: 5\' 4"  (1.626 m)     GENERAL: The patient is a well-nourished female, in no acute distress. The vital signs are documented above. CARDIAC: There is a regular rate and rhythm.  VASCULAR: Her fistula has a good thrill.  It is very aneurysmal throughout however.  The skin is somewhat thinned on the aneurysm that is more centrally located.  There is some calcium in the vein near the axilla. PULMONARY: There is good air exchange bilaterally without wheezing or rales. MUSCULOSKELETAL: There are no major deformities or cyanosis. NEUROLOGIC: No focal weakness or paresthesias are detected. SKIN: There are no ulcers or rashes noted. PSYCHIATRIC: The patient has a normal affect.  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 However we would like to try to get as much more mileage out of this as we can.  I would recommend plicating and addressing the more centrally located aneurysm.  I think there is still room to get 2 needles below that.  Once this can be used we could potentially consider plicating the proximal fistula which also was quite aneurysmal.  I have explained that at some point we will need to consider new access.  She dialyzes on Tuesdays Thursdays and Saturdays and the procedure has been scheduled for Monday, 03/28/2018.    Deitra Mayo [facility] [phone]

## 2018-03-25 ENCOUNTER — Ambulatory Visit (HOSPITAL_COMMUNITY): Payer: Medicaid Other | Admitting: Certified Registered Nurse Anesthetist

## 2018-03-25 ENCOUNTER — Other Ambulatory Visit: Payer: Self-pay

## 2018-03-25 ENCOUNTER — Encounter (HOSPITAL_COMMUNITY): Payer: Self-pay | Admitting: *Deleted

## 2018-03-25 NOTE — Progress Notes (Signed)
Spoke with pt for pre-op call. Pt denies cardiac history. Pt has hx of HTN, but not on medications at the present time. I asked her if she had run out of her medications or was she instructed to stop them. She states she was told she could stop them due to her BP being normal since she was on dialysis. Pt denies any recent chest pain or sob. Instructed pt to not smoke 24 hours prior to surgery.

## 2018-03-27 ENCOUNTER — Encounter (HOSPITAL_COMMUNITY): Payer: Self-pay | Admitting: Anesthesiology

## 2018-03-27 NOTE — Anesthesia Preprocedure Evaluation (Addendum)
Anesthesia Evaluation  Patient identified by MRN, date of birth, ID band Patient awake    Reviewed: Allergy & Precautions, NPO status , Patient's Chart, lab work & pertinent test results  Airway Mallampati: I       Dental no notable dental hx. (+) Teeth Intact   Pulmonary Current Smoker,    breath sounds clear to auscultation       Cardiovascular hypertension, Pt. on medications and Pt. on home beta blockers Normal cardiovascular exam Rate:Normal     Neuro/Psych negative psych ROS   GI/Hepatic negative GI ROS, (+)     substance abuse  cocaine use and marijuana use,   Endo/Other  negative endocrine ROS  Renal/GU ESRF and DialysisRenal disease  negative genitourinary   Musculoskeletal   Abdominal Normal abdominal exam  (+)   Peds  Hematology   Anesthesia Other Findings   Reproductive/Obstetrics                            Anesthesia Physical Anesthesia Plan  ASA: III  Anesthesia Plan: General   Post-op Pain Management:    Induction: Intravenous  PONV Risk Score and Plan: 3 and Ondansetron and Dexamethasone  Airway Management Planned: LMA  Additional Equipment:   Intra-op Plan:   Post-operative Plan:   Informed Consent: I have reviewed the patients History and Physical, chart, labs and discussed the procedure including the risks, benefits and alternatives for the proposed anesthesia with the patient or authorized representative who has indicated his/her understanding and acceptance.     Plan Discussed with: CRNA and Surgeon  Anesthesia Plan Comments:        Anesthesia Quick Evaluation

## 2018-03-28 ENCOUNTER — Encounter (HOSPITAL_COMMUNITY): Payer: Self-pay | Admitting: *Deleted

## 2018-03-28 ENCOUNTER — Ambulatory Visit (HOSPITAL_COMMUNITY)
Admission: RE | Admit: 2018-03-28 | Discharge: 2018-03-28 | Disposition: A | Discharge status: Home / Self Care | Payer: Medicaid Other | Source: Ambulatory Visit | Attending: Vascular Surgery | Admitting: Vascular Surgery

## 2018-03-28 ENCOUNTER — Encounter (HOSPITAL_COMMUNITY)
Admission: RE | Disposition: A | Discharge status: Home / Self Care | Payer: Self-pay | Source: Ambulatory Visit | Attending: Vascular Surgery

## 2018-03-28 ENCOUNTER — Other Ambulatory Visit: Payer: Self-pay

## 2018-03-28 DIAGNOSIS — Z7982 Long term (current) use of aspirin: Secondary | ICD-10-CM | POA: Insufficient documentation

## 2018-03-28 DIAGNOSIS — N186 End stage renal disease: Secondary | ICD-10-CM | POA: Diagnosis not present

## 2018-03-28 DIAGNOSIS — X58XXXA Exposure to other specified factors, initial encounter: Secondary | ICD-10-CM | POA: Insufficient documentation

## 2018-03-28 DIAGNOSIS — T82838A Hemorrhage of vascular prosthetic devices, implants and grafts, initial encounter: Secondary | ICD-10-CM | POA: Diagnosis present

## 2018-03-28 DIAGNOSIS — Z79899 Other long term (current) drug therapy: Secondary | ICD-10-CM | POA: Insufficient documentation

## 2018-03-28 DIAGNOSIS — Z5309 Procedure and treatment not carried out because of other contraindication: Secondary | ICD-10-CM | POA: Diagnosis not present

## 2018-03-28 DIAGNOSIS — I5022 Chronic systolic (congestive) heart failure: Secondary | ICD-10-CM | POA: Diagnosis not present

## 2018-03-28 DIAGNOSIS — F17200 Nicotine dependence, unspecified, uncomplicated: Secondary | ICD-10-CM | POA: Insufficient documentation

## 2018-03-28 DIAGNOSIS — I132 Hypertensive heart and chronic kidney disease with heart failure and with stage 5 chronic kidney disease, or end stage renal disease: Secondary | ICD-10-CM | POA: Diagnosis not present

## 2018-03-28 DIAGNOSIS — M329 Systemic lupus erythematosus, unspecified: Secondary | ICD-10-CM | POA: Diagnosis not present

## 2018-03-28 DIAGNOSIS — Z992 Dependence on renal dialysis: Secondary | ICD-10-CM | POA: Diagnosis not present

## 2018-03-28 DIAGNOSIS — Z7951 Long term (current) use of inhaled steroids: Secondary | ICD-10-CM | POA: Insufficient documentation

## 2018-03-28 HISTORY — DX: Cardiac murmur, unspecified: R01.1

## 2018-03-28 HISTORY — DX: Anemia, unspecified: D64.9

## 2018-03-28 HISTORY — DX: Pneumonia, unspecified organism: J18.9

## 2018-03-28 HISTORY — DX: Unspecified convulsions: R56.9

## 2018-03-28 LAB — RAPID URINE DRUG SCREEN, HOSP PERFORMED
Amphetamines: NOT DETECTED
Barbiturates: NOT DETECTED
Benzodiazepines: NOT DETECTED
Cocaine: POSITIVE — AB
Opiates: NOT DETECTED
Tetrahydrocannabinol: NOT DETECTED

## 2018-03-28 LAB — POCT I-STAT 4, (NA,K, GLUC, HGB,HCT)
Glucose, Bld: 81 mg/dL (ref 65–99)
HCT: 31 % — ABNORMAL LOW (ref 36.0–46.0)
HEMOGLOBIN: 10.5 g/dL — AB (ref 12.0–15.0)
POTASSIUM: 5.3 mmol/L — AB (ref 3.5–5.1)
SODIUM: 137 mmol/L (ref 135–145)

## 2018-03-28 LAB — HCG, SERUM, QUALITATIVE: Preg, Serum: NEGATIVE

## 2018-03-28 SURGERY — CANCELLED PROCEDURE
Anesthesia: General

## 2018-03-28 MED ORDER — CHLORHEXIDINE GLUCONATE 4 % EX LIQD: 60.0000 mL | Freq: Once | CUTANEOUS | Status: DC

## 2018-03-28 MED ORDER — PROPOFOL 10 MG/ML IV BOLUS
INTRAVENOUS | Status: AC
  Filled 2018-03-28: qty 20

## 2018-03-28 MED ORDER — DEXAMETHASONE SODIUM PHOSPHATE 10 MG/ML IJ SOLN
INTRAMUSCULAR | Status: AC
  Filled 2018-03-28: qty 1

## 2018-03-28 MED ORDER — FENTANYL CITRATE (PF) 250 MCG/5ML IJ SOLN
INTRAMUSCULAR | Status: AC
  Filled 2018-03-28: qty 5

## 2018-03-28 MED ORDER — SODIUM CHLORIDE 0.9 % IV SOLN
INTRAVENOUS | Status: DC
  Administered 2018-03-28: 07:00:00 via INTRAVENOUS

## 2018-03-28 MED ORDER — SODIUM CHLORIDE 0.9 % IV SOLN: INTRAVENOUS | Status: DC | PRN

## 2018-03-28 MED ORDER — SODIUM CHLORIDE 0.9 % IV SOLN
INTRAVENOUS | Status: AC
  Filled 2018-03-28: qty 1.2

## 2018-03-28 MED ORDER — SODIUM CHLORIDE 0.9 % IV SOLN: INTRAVENOUS | Status: DC

## 2018-03-28 MED ORDER — LIDOCAINE HCL (PF) 1 % IJ SOLN
INTRAMUSCULAR | Status: AC
  Filled 2018-03-28: qty 30

## 2018-03-28 MED ORDER — ONDANSETRON HCL 4 MG/2ML IJ SOLN
INTRAMUSCULAR | Status: AC
  Filled 2018-03-28: qty 2

## 2018-03-28 MED ORDER — SODIUM CHLORIDE 0.9 % IR SOLN: Status: DC | PRN

## 2018-03-28 MED ORDER — CEFAZOLIN SODIUM-DEXTROSE 2-4 GM/100ML-% IV SOLN
2.0000 g | INTRAVENOUS | Status: DC
  Filled 2018-03-28: qty 100

## 2018-03-28 MED ORDER — MIDAZOLAM HCL 2 MG/2ML IJ SOLN
INTRAMUSCULAR | Status: AC
  Filled 2018-03-28: qty 2

## 2018-03-28 MED ORDER — LIDOCAINE HCL (CARDIAC) 20 MG/ML IV SOLN
INTRAVENOUS | Status: AC
  Filled 2018-03-28: qty 5

## 2018-03-28 NOTE — Progress Notes (Signed)
Spoke to lab regarding UDS results. Lab states 10 minutes.

## 2018-03-28 NOTE — Progress Notes (Signed)
Patient's drug screen positive.  Dr. Jillyn Hidden and Dr. Scot Dock aware.  Procedure cancelled.

## 2018-03-28 NOTE — Interval H&P Note (Signed)
History and Physical Interval Note:  03/28/2018 7:23 AM  [name]  has presented today for surgery, with the diagnosis of BLEEDING LEFT UPPER ARM BASILIC VEIN TRANSPOSITION  The various methods of treatment have been discussed with the patient and family. After consideration of risks, benefits and other options for treatment, the patient has consented to  Procedure(s): [place] (Left) as a surgical intervention .  The patient's history has been reviewed, patient examined, no change in status, stable for surgery.  I have reviewed the patient's chart and labs.  Questions were answered to the patient's satisfaction.     Deitra Mayo

## 2018-03-28 NOTE — Progress Notes (Signed)
Patient's IV removed and site clean, dry, and intact at removal.  Patient escorted to main entrance via wheelchair with nurse tech.

## 2018-05-09 ENCOUNTER — Other Ambulatory Visit: Payer: Self-pay | Admitting: *Deleted

## 2018-05-12 MED ORDER — MEPERIDINE HCL 50 MG/ML IJ SOLN: 6.2500 mg | INTRAMUSCULAR | Status: DC | PRN

## 2018-05-12 MED ORDER — FENTANYL CITRATE (PF) 100 MCG/2ML IJ SOLN: 25.0000 ug | INTRAMUSCULAR | Status: DC | PRN

## 2018-05-21 ENCOUNTER — Other Ambulatory Visit: Payer: Self-pay

## 2018-05-21 ENCOUNTER — Encounter (HOSPITAL_COMMUNITY): Payer: Self-pay | Admitting: Emergency Medicine

## 2018-05-21 ENCOUNTER — Emergency Department (HOSPITAL_COMMUNITY)
Admission: EM | Admit: 2018-05-21 | Discharge: 2018-05-21 | Disposition: A | Discharge status: Home / Self Care | Payer: Medicaid Other | Attending: Emergency Medicine | Admitting: Emergency Medicine

## 2018-05-21 DIAGNOSIS — I5022 Chronic systolic (congestive) heart failure: Secondary | ICD-10-CM | POA: Diagnosis not present

## 2018-05-21 DIAGNOSIS — F17210 Nicotine dependence, cigarettes, uncomplicated: Secondary | ICD-10-CM | POA: Diagnosis not present

## 2018-05-21 DIAGNOSIS — M3210 Systemic lupus erythematosus, organ or system involvement unspecified: Secondary | ICD-10-CM | POA: Insufficient documentation

## 2018-05-21 DIAGNOSIS — N186 End stage renal disease: Secondary | ICD-10-CM | POA: Diagnosis not present

## 2018-05-21 DIAGNOSIS — Z992 Dependence on renal dialysis: Secondary | ICD-10-CM | POA: Diagnosis not present

## 2018-05-21 DIAGNOSIS — I471 Supraventricular tachycardia: Secondary | ICD-10-CM

## 2018-05-21 DIAGNOSIS — Z79899 Other long term (current) drug therapy: Secondary | ICD-10-CM | POA: Insufficient documentation

## 2018-05-21 DIAGNOSIS — R000 Tachycardia, unspecified: Secondary | ICD-10-CM | POA: Diagnosis present

## 2018-05-21 DIAGNOSIS — I132 Hypertensive heart and chronic kidney disease with heart failure and with stage 5 chronic kidney disease, or end stage renal disease: Secondary | ICD-10-CM | POA: Insufficient documentation

## 2018-05-21 LAB — CBC
HEMATOCRIT: 34.6 % — AB (ref 36.0–46.0)
HEMOGLOBIN: 10 g/dL — AB (ref 12.0–15.0)
MCH: 25.1 pg — ABNORMAL LOW (ref 26.0–34.0)
MCHC: 28.9 g/dL — ABNORMAL LOW (ref 30.0–36.0)
MCV: 86.9 fL (ref 78.0–100.0)
Platelets: 215 10*3/uL (ref 150–400)
RBC: 3.98 MIL/uL (ref 3.87–5.11)
RDW: 19.1 % — ABNORMAL HIGH (ref 11.5–15.5)
WBC: 3.5 10*3/uL — ABNORMAL LOW (ref 4.0–10.5)

## 2018-05-21 LAB — COMPREHENSIVE METABOLIC PANEL
ALBUMIN: 2.1 g/dL — AB (ref 3.5–5.0)
ALK PHOS: 65 U/L (ref 38–126)
ALT: 22 U/L (ref 14–54)
AST: 37 U/L (ref 15–41)
Anion gap: 10 (ref 5–15)
BILIRUBIN TOTAL: 0.4 mg/dL (ref 0.3–1.2)
BUN: 33 mg/dL — AB (ref 6–20)
CALCIUM: 8.5 mg/dL — AB (ref 8.9–10.3)
CO2: 31 mmol/L (ref 22–32)
CREATININE: 7.04 mg/dL — AB (ref 0.44–1.00)
Chloride: 95 mmol/L — ABNORMAL LOW (ref 101–111)
GFR calc Af Amer: 8 mL/min — ABNORMAL LOW (ref >60)
GFR calc non Af Amer: 7 mL/min — ABNORMAL LOW (ref >60)
GLUCOSE: 72 mg/dL (ref 65–99)
Potassium: 3.5 mmol/L (ref 3.5–5.1)
SODIUM: 136 mmol/L (ref 135–145)
Total Protein: 8 g/dL (ref 6.5–8.1)

## 2018-05-21 LAB — MAGNESIUM: Magnesium: 2 mg/dL (ref 1.7–2.4)

## 2018-05-21 MED ORDER — ADENOSINE 6 MG/2ML IV SOLN
INTRAVENOUS | Status: AC
  Filled 2018-05-21: qty 4

## 2018-05-21 MED ORDER — ADENOSINE 6 MG/2ML IV SOLN
12.0000 mg | Freq: Once | INTRAVENOUS | Status: AC
  Administered 2018-05-21: 12 mg via INTRAVENOUS
  Filled 2018-05-21: qty 4

## 2018-05-21 MED ORDER — SODIUM CHLORIDE 0.9 % IV BOLUS (SEPSIS)
250.0000 mL | Freq: Once | INTRAVENOUS | Status: AC
  Administered 2018-05-21: 250 mL via INTRAVENOUS

## 2018-05-21 MED ORDER — CARVEDILOL 3.125 MG PO TABS: 3.1250 mg | ORAL_TABLET | Freq: Two times a day (BID) | ORAL | 2 refills | Status: DC

## 2018-05-21 NOTE — ED Notes (Signed)
Took patient saline lot out patient is resting with family at bedside and call bell in reach

## 2018-05-21 NOTE — ED Notes (Signed)
Patient requesting EDP to write prescription at discharge for Carvedilol.  EDP aware

## 2018-05-21 NOTE — ED Notes (Signed)
Got patient up to the bedside toilet patient has call bell in reach 

## 2018-05-21 NOTE — ED Provider Notes (Signed)
[facility] EMERGENCY DEPARTMENT Provider Note  CSN: [id number] Arrival date & time: 05/21/18 3295  Chief Complaint(s) Tachycardia  HPI [name] is a 37 y.o. female with a history of ESRD on dialysis who presents from dialysis center for tachycardia.  Patient noted to be tachycardic and mildly hypotensive during dialysis today.  EMS called and noted the patient was in SVT.  Patient was given 6 mg of adenosine resulting in conversion to normal sinus rhythm.  However just prior to pulling into the hospital, patient reverted back to SVT and received 12 mg of additional adenosine.  With the SVT the patient is having throat tightness.  She denies any overt chest pain or shortness of breath.  She endorses fatigue associated with the tachycardia.  Denies any nausea.  No recent emesis or diarrhea.  She states that she is currently taking Augmentin given to her by her PCP for a respiratory infection.  Patient reports that she is approximately 3 kg under her dry weight.  HPI   Past Medical History Past Medical History:  Diagnosis Date  . Anemia    low iron - receives iron at dialysis  . Benign hypertension with ESRD (end-stage renal disease) ([place]) 03/15/2014  . Chronic systolic congestive heart failure ([place]) 03/16/2016  . H/O pericarditis 01/17/2013  . H/O pleural effusion 01/17/2013  . Heart murmur   . Lupus (systemic lupus erythematosus) (HCC)    Previously followed with Dr. Charlestine Night, has not followed up recently  . 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 Pneumonia   . Polysubstance abuse ([place])    cocaine, MJ, tobacco  . S/P pericardiocentesis 01/17/2013   H/o pericardial effusion with tamponade 2006   . Seizures ([place])    during pregnancy 1 time   Patient Active Problem List   Diagnosis Date Noted  . ESRD (end stage  renal disease) on dialysis ([place]) 02/17/2017  . SOB (shortness of breath) 02/16/2017  . Chronic systolic congestive heart failure ([place]) 03/16/2016  . Cough   . Ascites   . Follow up   . Other hypervolemia   . S/P thoracentesis   . Abdominal pain   . Pleural effusion   . Hemoptysis   . Myalgia   . Pulmonary edema   . Crack cocaine overdose ([place])   . ESRD (end stage renal disease) ([place]) 03/03/2016  . Hypertensive emergency 03/03/2016  . Acute bronchitis 02/03/2016  . Volume overload 11/16/2015  . Shortness of breath 11/16/2015  . Acute UTI   . Hyperkalemia 09/25/2015  . Rash and nonspecific skin eruption 06/26/2015  . Secondary Raynaud's phenomenon 06/24/2015  . Cramping of hands 04/19/2014  . Unspecified contraceptive management 04/19/2014  . End stage renal disease ([place]) 03/27/2014  . Insomnia 03/15/2014  . Benign hypertension with ESRD (end-stage renal disease) ([place]) 03/15/2014  . Tobacco abuse 02/15/2014  . Healthcare maintenance 02/15/2014  . Hypoalbuminemia 02/01/2014  . Nephrotic syndrome 02/01/2014  . ESRD on dialysis ([place]) 01/31/2014  . Pleural effusion, left 01/31/2014  . Metabolic acidosis, increased anion gap (IAG) 01/31/2014  . Microcytic anemia 01/29/2014  . Hypocalcemia 01/29/2014  . Streptococcal bacteremia 01/23/2013  . Cocaine abuse ([place]) 01/18/2013  . Marijuana smoker ([place]) 01/18/2013  . H/O pericarditis 01/17/2013  . SLE (systemic lupus erythematosus) ([place]) 01/17/2013  . Lupus nephritis ([place]) 01/17/2013  . S/P pericardiocentesis 01/17/2013  . H/O pleural effusion  01/17/2013  . Nephrosis 01/17/2013  . Trichomoniasis 01/17/2013  . Preseptal cellulitis 01/17/2013   Home Medication(s) Prior to Admission medications   Medication Sig Start Date End Date Taking? Authorizing Provider  amoxicillin-clavulanate (AUGMENTIN) 875-125 MG tablet Take 1 tablet by mouth daily. For 7 days.   Yes [provider]  calcium acetate (PHOSLO) 667 MG capsule Take  2,001-4,002 mg by mouth See admin instructions. 4,002 mg three times a day with each meal and 2,001 mg with each snack   Yes [provider]  darbepoetin (ARANESP) 60 MCG/0.3ML SOLN injection Inject 0.3 mLs (60 mcg total) into the vein every Wednesday with hemodialysis. Patient taking differently: Inject 60 mcg into the vein every 7 (seven) days.  02/08/14  Yes Jessee Avers, MD  diphenhydrAMINE (BENADRYL) 50 MG tablet Take 50 mg by mouth every 6 (six) hours as needed for itching.   Yes [provider]  ferric gluconate 62.5 mg in sodium chloride 0.9 % 100 mL Inject 62.5 mg into the vein every Thursday with hemodialysis. Patient taking differently: Inject 62.5 mg into the vein every Saturday with hemodialysis.  03/09/16  Yes Rice, Resa Miner, MD  multivitamin (RENA-VIT) TABS tablet Take 1 tablet by mouth daily. 02/18/17  Yes Holley Raring, MD  Nutritional Supplements (FEEDING SUPPLEMENT, NEPRO CARB STEADY,) LIQD Take 237 mLs by mouth See admin instructions. At every dialysis treatment on Tues/Thurs/Sat   Yes [provider]  albuterol (PROVENTIL HFA;VENTOLIN HFA) 108 (90 Base) MCG/ACT inhaler Inhale 1-2 puffs into the lungs every 6 (six) hours as needed for wheezing or shortness of breath. Patient not taking: Reported on 12/25/2017 02/03/16   Lucious Groves, DO  carvedilol (COREG) 3.125 MG tablet Take 1 tablet (3.125 mg total) by mouth 2 (two) times daily with a meal. 05/21/18   Cardama, Grayce Sessions, MD  hydrALAZINE (APRESOLINE) 25 MG tablet Take 1 tablet (25 mg total) by mouth 3 (three) times daily. Patient not taking: Reported on 03/23/2018 02/18/17   Holley Raring, MD  hydroxychloroquine (PLAQUENIL) 200 MG tablet Take 2 tablets (400 mg total) by mouth daily. Patient not taking: Reported on 03/23/2018 02/18/17   Holley Raring, MD  isosorbide mononitrate (IMDUR) 30 MG 24 hr tablet Take 1 tablet (30 mg total) by mouth daily. Patient not taking: Reported on 03/23/2018  02/18/17   Holley Raring, MD  triamcinolone cream (KENALOG) 0.1 % Apply 1 application topically 2 (two) times daily. Patient not taking: Reported on 02/16/2017 06/24/15   Lucious Groves, DO                                                                                                                                    Past Surgical History Past Surgical History:  Procedure Laterality Date  . AV FISTULA PLACEMENT    . BASCILIC VEIN TRANSPOSITION Left 02/05/2014   Procedure: [place];  Surgeon: [name], MD;  Location: [place];  Service: Vascular;  Laterality: Left;  [place] Kitchen VENOGRAM Right 01/31/2014   Procedure: DIALYSIS CATHETER;  Surgeon: Serafina [place], MD;  Location: [facility] CATH LAB;  Service: Cardiovascular;  Laterality: Right;   Family History History reviewed. No pertinent family history.  Social History Social History   Tobacco Use  . Smoking status: Current Every Day Smoker    Packs/day: 0.25    Years: 15.00    Pack years: 3.75    Types: Cigarettes  . Smokeless tobacco: Never Used  . Tobacco comment: quit smoking s/p hospital discharge/ SMOKES ABOUT 2-3 CIGARETTES A DAY  Substance Use Topics  . Alcohol use: Yes    Alcohol/week: 0.0 oz    Comment: occ  . Drug use: Yes    Frequency: 5.0 times per week    Types: Marijuana, Cocaine    Comment: no cocaine as of 02/2018 but uses occasional mariuana   Allergies Tobramycin sulfate  Review of Systems Review of Systems All other systems are reviewed and are negative for acute change except as noted in the HPI  Physical Exam Vital Signs  I have reviewed the triage vital signs BP 96/71   Pulse (!) 178   Temp 98.3 F (36.8 C) (Oral)   Resp (!) 30   SpO2 97%  All other systems are reviewed and are negative for acute change except as noted in the HPI  Physical Exam  Constitutional: She is oriented to person, place, and time. She appears well-developed and well-nourished. No distress.  HENT:  Head:  Normocephalic and atraumatic.  Nose: Nose normal.  Eyes: Pupils are equal, round, and reactive to light. Conjunctivae and EOM are normal. Right eye exhibits no discharge. Left eye exhibits no discharge. No scleral icterus.  Neck: Normal range of motion. Neck supple.  Cardiovascular: Regular rhythm. Tachycardia present. Exam reveals no gallop and no friction rub.  No murmur heard. Pulmonary/Chest: Effort normal and breath sounds normal. No stridor. No respiratory distress. She has no rales.  Abdominal: Soft. She exhibits no distension. There is no tenderness.  Musculoskeletal: She exhibits no edema or tenderness.       Arms: Neurological: She is alert and oriented to person, place, and time.  Skin: Skin is warm and dry. No rash noted. She is not diaphoretic. No erythema.  Psychiatric: She has a normal mood and affect.  Vitals reviewed.   ED Results and Treatments Labs (all labs ordered are listed, but only abnormal results are displayed) Labs Reviewed  CBC - Abnormal; Notable for the following components:      Result Value   WBC 3.5 (*)    Hemoglobin 10.0 (*)    HCT 34.6 (*)    MCH 25.1 (*)    MCHC 28.9 (*)    RDW 19.1 (*)    All other components within normal limits  COMPREHENSIVE METABOLIC PANEL - Abnormal; Notable for the following components:   Chloride 95 (*)    BUN 33 (*)    Creatinine, Ser 7.04 (*)    Calcium 8.5 (*)    Albumin 2.1 (*)    GFR calc non Af Amer 7 (*)    GFR calc Af Amer 8 (*)    All other components within normal limits  MAGNESIUM  EKG  EKG Interpretation  Date/Time:  Saturday May 21 2018 09:44:42 EDT Ventricular Rate:  178 PR Interval:    QRS Duration: 108 QT Interval:  275 QTC Calculation: 474 R Axis:   96 Text Interpretation:  SVT Borderline right axis deviation Probable LVH with secondary repol abnrm Artifact in lead(s) I II  aVR Confirmed by Addison Lank [phone]) on 05/21/2018 10:21:27 AM       EKG Interpretation  Date/Time:  Saturday May 21 2018 10:59:18 EDT Ventricular Rate:  98 PR Interval:    QRS Duration: 90 QT Interval:  390 QTC Calculation: 498 R Axis:   88 Text Interpretation:  Sinus rhythm Prolonged PR interval Biatrial enlargement Probable left ventricular hypertrophy Borderline T abnormalities, inferior leads Prolonged QT interval resolved SVT Confirmed by Addison Lank [phone]) on 05/21/2018 11:02:52 AM       Radiology No results found. Pertinent labs & imaging results that were available during my care of the patient were reviewed by me and considered in my medical decision making (see chart for details).  Medications Ordered in ED Medications  sodium chloride 0.9 % bolus 250 mL (0 mLs Intravenous Stopped 05/21/18 1043)  adenosine (ADENOCARD) 6 MG/2ML injection 12 mg (12 mg Intravenous Given 05/21/18 1053)                                                                                                                                    Procedures .Cardioversion Date/Time: 05/21/2018 1:51 PM Performed by: Fatima Blank, MD Authorized by: Fatima Blank, MD   Consent:    Consent obtained:  Verbal   Consent given by:  Patient Pre-procedure details:    Cardioversion basis:  Emergent   Rhythm:  Supraventricular tachycardia   Electrode placement:  Anterior-lateral Post-procedure details:    Patient status:  Awake   Patient tolerance of procedure:  Tolerated well, no immediate complications Comments:     Patient was chemically cardioverted with 12 mg of adenosine.     CRITICAL CARE Performed by: Grayce Sessions Cardama Total critical care time: 30 minutes Critical care time was exclusive of separately billable procedures and treating other patients. Critical care was necessary to treat or prevent imminent or life-threatening deterioration. Critical care was time spent  personally by me on the following activities: development of treatment plan with patient and/or surrogate as well as nursing, discussions with consultants, evaluation of patient's response to treatment, examination of patient, obtaining history from patient or surrogate, ordering and performing treatments and interventions, ordering and review of laboratory studies, ordering and review of radiographic studies, pulse oximetry and re-evaluation of patient's condition.   (including critical care time)  Medical Decision Making / ED Course I have reviewed the nursing notes for this encounter and the patient's prior records (if available in EHR or on provided paperwork).  Clinical Course as of May 21 1352  Sat May 21, 2018  8563 Patient tachycardic to the  170s, noted to be in SVT confirmed by EKG. patient responded to initial 6 mg of adenosine by EMS.  Was given 12 mg of adenosine upon arrival by EMS due to recurrence.  No response.  Patient blood pressures are soft with systolics in high 54S.  Pads applied.  Vagal maneuvers unsuccessful.  Patient is under her dry weight.  We will provide her with a small IV fluid bolus.  Will obtain screening labs to assess for any electrolyte derangements.  We will continue to monitor patient and likely attempt one last adenosine dose.    [PC]  5681 Patient converted to normal sinus rhythm with 12 mg of adenosine.  EKG confirmed normal sinus rhythm.  Patient also given small IV fluid bolus.  Symptoms and blood pressure improved.  We will continue to monitor for recurrence.   [PC]  2751 Patient still stable without recurrence of SVT.   [PC]  7001 Patient continues to be stable without recurrence of SVT.  Able to tolerate oral hydration and intake.  Labs without significant electrolyte derangements.   [PC]    Clinical Course User Index [PC] Cardama, Grayce Sessions, MD    The patient appears reasonably screened and/or stabilized for discharge and I doubt any other  medical condition or other [facility] requiring further screening, evaluation, or treatment in the ED at this time prior to discharge.  The patient is safe for discharge with strict return precautions.   Final Clinical Impression(s) / ED Diagnoses Final diagnoses:  SVT (supraventricular tachycardia) ([place])   Disposition: Discharge  Condition: Good  I have discussed the results, Dx and Tx plan with the patient who expressed understanding and agree(s) with the plan. Discharge instructions discussed at great length. The patient was given strict return precautions who verbalized understanding of the instructions. No further questions at time of discharge.    ED Discharge Orders        Ordered    carvedilol (COREG) 3.125 MG tablet  2 times daily with meals     05/21/18 1352       Follow Up: Primary care provider  Schedule an appointment as soon as possible for a visit  As needed      This chart was dictated using voice recognition software.  Despite best efforts to proofread,  errors can occur which can change the documentation meaning.   Fatima Blank, MD 05/21/18 1353

## 2018-05-21 NOTE — Progress Notes (Signed)
Left upper arm fistula deacessed , Pressure held no bleeding noted . Gauze dsg applied. No complications noted.

## 2018-05-21 NOTE — ED Notes (Signed)
EDP aware of fluids being done and current HR

## 2018-05-21 NOTE — ED Notes (Signed)
Patient on [place], cardiac monitor and [place] 2L in preparation for Adenosine.

## 2018-05-21 NOTE — ED Notes (Signed)
EDP at bedside  

## 2018-05-21 NOTE — ED Notes (Signed)
Patient able to ambulate independently  

## 2018-05-21 NOTE — ED Triage Notes (Signed)
[facility]:  Patient presents from dialysis where she received 1 hours worth.  Began to have tightness in her right chest/neck and when placed on the monitor HR of 160 noted.  En route EMs gave 6 and then 12 of adenosine with a 10 minute relief of symptoms after the 6mg  dose.  Patient axo, c/o dizziness, light-headedness, and SOB.

## 2018-05-27 ENCOUNTER — Encounter (HOSPITAL_COMMUNITY): Payer: Self-pay | Admitting: *Deleted

## 2018-05-27 ENCOUNTER — Other Ambulatory Visit: Payer: Self-pay

## 2018-05-27 NOTE — Progress Notes (Signed)
[name] reports that she was started on Coreg when she was seen in ED, May 25, patient reports that she is taking it as prescribed. Patient is not taking Hydroxychloroquine, Isosorbide, Hydralazine and has not taking for over 2 months.  I asked patient if her Dr at the kidney center is aware- she said that she tells the staff when they check medication list.  Patient reports that she has chest pain when she is on the dialysis machine, sometimes. I called the kidney center, I was on hold > 5 minutes,  I will  call back later.  I asked Willeen Cass, RN_C to review chart.  I called Becky at [facility], she asked me to notify Dr Scot Dock, I sent Dr Scot Dock an inbox message.

## 2018-05-27 NOTE — Progress Notes (Signed)
Anesthesia Chart Review:  Pt is a same day work up   Case:  [id number] Date/Time:  05/30/18 0715   Procedure:  FISTULA PLICATION BASILIC VEIN TRANSPOSITION (Left )   Anesthesia type:  General   Pre-op diagnosis:  mechanical complication of fistula   Location:  [facility] OR ROOM 12 / [place] OR   Surgeon:  Angelia Mould, MD      DISCUSSION:  - Pt is a 37 year old female with hx cardiomyopathy, CHF (EF 45% 01/2017, improved from 20-25% in 2017), severe mitral regurgitation (by 01/2017 echo), lupus, ESRD on hemodialysis, HTN, cocaine use.   - Surgery originally scheduled for 03/28/18 but was cancelled due to positive UDS for cocaine. Pt reports she has not used cocaine since mid-April  - ED visit 05/21/18 for SVT occurring during dialysis.  After adenosine 6mg  given by EMS, converted to NSR. Just prior to EMS arrival to ED, pt reverted back to SVT and received 12 mg of additional adenosine and converted to NSR. Pt started on coreg.   - Pt reported to pre-admission testing RN during pre-op phone call today that she sometimes gets chest pain during dialysis.   - Pt stopped her imdur and hydralazine > 2 months ago   LABS: Will be obtained day of surgery   IMAGES: No results found.  EKG 05/24/18: Sinus rhythm. Prolonged PR interval. Biatrial enlargement. Probable LVH. Borderline T abnormalities, inferior leads. Prolonged QT interval. resolved SVT   CV:  Echo 02/17/17:  - Left ventricle: The cavity size was normal. Wall thickness was increased in a pattern of moderate LVH. The estimated ejection fraction was 45%. Inferolateral hypokinesis. Features are consistent with a pseudonormal left ventricular filling pattern, with concomitant abnormal relaxation and increased filling pressure (grade 2 diastolic dysfunction). - Aortic valve: There was no stenosis. - Mitral valve: Moderately calcified annulus. The posterior leaflet is restricted with severe mitral regurgitation. - Left atrium: The atrium was  severely dilated. - Right ventricle: The cavity size was normal. Systolic function was normal. - Pulmonary arteries: PA peak pressure: 38 mm Hg (S). - Systemic veins: IVC measured 1.9 cm with < 50% respirophasic variation, suggesting RA pressure 8 mmHg. - Pericardium, extracardiac: There is a left-sided pleural effusion. There was no pericardial effusion. - Impressions: Normal LV size with moderate LV hypertrophy. EF 45%. Inferolateral hypokinesis. Moderate diastolic dysfunction. Normal RV size and systolic function. Restricted posterior mitral leaflet with severe mitral regurgitation (?infarct-related with inferolateral wall motion abnormality). Severe left atrial enlargement. Mild pulmonary hypertension.   Past Medical History:  Diagnosis Date  . Anemia    low iron - receives iron at dialysis  . Chronic systolic congestive heart failure ([place]) 03/16/2016  . Dyspnea   . ESRD (end stage renal disease) ([place])    [facility] TTHSAT _ [place]  . H/O pericarditis 01/17/2013  . H/O pleural effusion 01/17/2013  . Heart murmur   . Lupus (systemic lupus erythematosus) (HCC)    Previously followed with Dr. Charlestine Night, has not followed up recently  . 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 Pneumonia    many times  . Polysubstance abuse ([place])    cocaine, MJ, tobacco  . S/P pericardiocentesis 01/17/2013   H/o pericardial effusion with tamponade 2006   . Seizures ([place])    during pregnancy 1 time    Past Surgical History:  Procedure Laterality Date  .  AV FISTULA PLACEMENT    . BASCILIC VEIN TRANSPOSITION Left 02/05/2014   Procedure: [place];  Surgeon: [name], MD;  Location: [place];  Service: Vascular;  Laterality: Left;  [place] Kitchen VENOGRAM Right 01/31/2014   Procedure: DIALYSIS CATHETER;  Surgeon: Serafina [place], MD;  Location[facility] CATH  LAB;  Service: Cardiovascular;  Laterality: Right;    MEDICATIONS: . fentaNYL (SUBLIMAZE) injection 25-50 mcg  . meperidine (DEMEROL) injection 6.25-12.5 mg   . albuterol (PROVENTIL HFA;VENTOLIN HFA) 108 (90 Base) MCG/ACT inhaler  . amoxicillin-clavulanate (AUGMENTIN) 875-125 MG tablet  . calcium acetate (PHOSLO) 667 MG capsule  . carvedilol (COREG) 3.125 MG tablet  . darbepoetin (ARANESP) 60 MCG/0.3ML SOLN injection  . diphenhydrAMINE (BENADRYL) 50 MG tablet  . ferric gluconate 62.5 mg in sodium chloride 0.9 % 100 mL  . hydrALAZINE (APRESOLINE) 25 MG tablet  . hydroxychloroquine (PLAQUENIL) 200 MG tablet  . isosorbide mononitrate (IMDUR) 30 MG 24 hr tablet  . multivitamin (RENA-VIT) TABS tablet  . Nutritional Supplements (FEEDING SUPPLEMENT, NEPRO CARB STEADY,) LIQD  . triamcinolone cream (KENALOG) 0.1 %   - Pt is not taking imdur or hydralazine   Pt will need further assessment by assigned anesthesiologist day of surgery.  If no concerning CV symptoms, and labs acceptable day of surgery, I anticipate pt can proceed with surgery as scheduled.  Willeen Cass, FNP-BC [facility] Short Stay Surgical Center/Anesthesiology Phone: [phone] 05/27/2018 1:01 PM

## 2018-05-30 ENCOUNTER — Telehealth: Payer: Self-pay | Admitting: Vascular Surgery

## 2018-05-30 ENCOUNTER — Other Ambulatory Visit: Payer: Self-pay

## 2018-05-30 ENCOUNTER — Ambulatory Visit (HOSPITAL_COMMUNITY): Payer: Medicaid Other | Admitting: Emergency Medicine

## 2018-05-30 ENCOUNTER — Encounter (HOSPITAL_COMMUNITY): Payer: Self-pay | Admitting: *Deleted

## 2018-05-30 ENCOUNTER — Encounter (HOSPITAL_COMMUNITY)
Admission: RE | Disposition: A | Discharge status: Home / Self Care | Payer: Self-pay | Source: Ambulatory Visit | Attending: Vascular Surgery

## 2018-05-30 ENCOUNTER — Ambulatory Visit (HOSPITAL_COMMUNITY)
Admission: RE | Admit: 2018-05-30 | Discharge: 2018-05-30 | Disposition: A | Discharge status: Home / Self Care | Payer: Medicaid Other | Source: Ambulatory Visit | Attending: Vascular Surgery | Admitting: Vascular Surgery

## 2018-05-30 DIAGNOSIS — Y832 Surgical operation with anastomosis, bypass or graft as the cause of abnormal reaction of the patient, or of later complication, without mention of misadventure at the time of the procedure: Secondary | ICD-10-CM | POA: Diagnosis not present

## 2018-05-30 DIAGNOSIS — Z992 Dependence on renal dialysis: Secondary | ICD-10-CM | POA: Insufficient documentation

## 2018-05-30 DIAGNOSIS — T82898A Other specified complication of vascular prosthetic devices, implants and grafts, initial encounter: Secondary | ICD-10-CM | POA: Diagnosis not present

## 2018-05-30 DIAGNOSIS — N186 End stage renal disease: Secondary | ICD-10-CM | POA: Insufficient documentation

## 2018-05-30 DIAGNOSIS — I5022 Chronic systolic (congestive) heart failure: Secondary | ICD-10-CM | POA: Diagnosis not present

## 2018-05-30 DIAGNOSIS — F17210 Nicotine dependence, cigarettes, uncomplicated: Secondary | ICD-10-CM | POA: Insufficient documentation

## 2018-05-30 DIAGNOSIS — M3214 Glomerular disease in systemic lupus erythematosus: Secondary | ICD-10-CM | POA: Diagnosis not present

## 2018-05-30 DIAGNOSIS — Z8701 Personal history of pneumonia (recurrent): Secondary | ICD-10-CM | POA: Insufficient documentation

## 2018-05-30 DIAGNOSIS — D631 Anemia in chronic kidney disease: Secondary | ICD-10-CM | POA: Insufficient documentation

## 2018-05-30 HISTORY — PX: FISTULA SUPERFICIALIZATION: SHX6341

## 2018-05-30 HISTORY — DX: End stage renal disease: N18.6

## 2018-05-30 HISTORY — DX: Dyspnea, unspecified: R06.00

## 2018-05-30 LAB — HCG, SERUM, QUALITATIVE: Preg, Serum: NEGATIVE

## 2018-05-30 LAB — RAPID URINE DRUG SCREEN, HOSP PERFORMED
Amphetamines: NOT DETECTED
BENZODIAZEPINES: NOT DETECTED
Barbiturates: NOT DETECTED
COCAINE: POSITIVE — AB
OPIATES: NOT DETECTED
TETRAHYDROCANNABINOL: NOT DETECTED

## 2018-05-30 LAB — POCT I-STAT 4, (NA,K, GLUC, HGB,HCT)
GLUCOSE: 74 mg/dL (ref 65–99)
HEMATOCRIT: 31 % — AB (ref 36.0–46.0)
Hemoglobin: 10.5 g/dL — ABNORMAL LOW (ref 12.0–15.0)
Potassium: 4.8 mmol/L (ref 3.5–5.1)
Sodium: 136 mmol/L (ref 135–145)

## 2018-05-30 SURGERY — FISTULA SUPERFICIALIZATION
Anesthesia: General | Site: Arm Lower | Laterality: Left

## 2018-05-30 MED ORDER — ONDANSETRON HCL 4 MG/2ML IJ SOLN
INTRAMUSCULAR | Status: AC
  Filled 2018-05-30: qty 2

## 2018-05-30 MED ORDER — MIDAZOLAM HCL 2 MG/2ML IJ SOLN
INTRAMUSCULAR | Status: AC
  Filled 2018-05-30: qty 2

## 2018-05-30 MED ORDER — DEXAMETHASONE SODIUM PHOSPHATE 10 MG/ML IJ SOLN
INTRAMUSCULAR | Status: AC
  Filled 2018-05-30: qty 1

## 2018-05-30 MED ORDER — MIDAZOLAM HCL 5 MG/5ML IJ SOLN
INTRAMUSCULAR | Status: DC | PRN
  Administered 2018-05-30: 2 mg via INTRAVENOUS

## 2018-05-30 MED ORDER — LIDOCAINE-EPINEPHRINE (PF) 1 %-1:200000 IJ SOLN
INTRAMUSCULAR | Status: DC | PRN
  Administered 2018-05-30: 30 mL

## 2018-05-30 MED ORDER — PROTAMINE SULFATE 10 MG/ML IV SOLN
INTRAVENOUS | Status: DC | PRN
  Administered 2018-05-30: 25 mg via INTRAVENOUS

## 2018-05-30 MED ORDER — CEFAZOLIN SODIUM-DEXTROSE 2-4 GM/100ML-% IV SOLN
2.0000 g | INTRAVENOUS | Status: AC
  Administered 2018-05-30: 2 g via INTRAVENOUS
  Filled 2018-05-30: qty 100

## 2018-05-30 MED ORDER — PHENYLEPHRINE 40 MCG/ML (10ML) SYRINGE FOR IV PUSH (FOR BLOOD PRESSURE SUPPORT)
PREFILLED_SYRINGE | INTRAVENOUS | Status: AC
Start: 2018-05-30 — End: ?
  Filled 2018-05-30: qty 10

## 2018-05-30 MED ORDER — PROPOFOL 10 MG/ML IV BOLUS
INTRAVENOUS | Status: DC | PRN
  Administered 2018-05-30: 90 mg via INTRAVENOUS

## 2018-05-30 MED ORDER — PROTAMINE SULFATE 10 MG/ML IV SOLN
INTRAVENOUS | Status: AC
  Filled 2018-05-30: qty 5

## 2018-05-30 MED ORDER — OXYCODONE HCL 5 MG/5ML PO SOLN: 5.0000 mg | Freq: Once | ORAL | Status: DC | PRN

## 2018-05-30 MED ORDER — FENTANYL CITRATE (PF) 250 MCG/5ML IJ SOLN
INTRAMUSCULAR | Status: AC
  Filled 2018-05-30: qty 5

## 2018-05-30 MED ORDER — LIDOCAINE-EPINEPHRINE 1 %-1:200000 IJ SOLN
INTRAMUSCULAR | Status: AC
Start: 2018-05-30 — End: ?
  Filled 2018-05-30: qty 30

## 2018-05-30 MED ORDER — LIDOCAINE 2% (20 MG/ML) 5 ML SYRINGE
INTRAMUSCULAR | Status: AC
  Filled 2018-05-30: qty 5

## 2018-05-30 MED ORDER — PHENYLEPHRINE HCL 10 MG/ML IJ SOLN
INTRAVENOUS | Status: DC | PRN
  Administered 2018-05-30: 10 ug/min via INTRAVENOUS

## 2018-05-30 MED ORDER — LIDOCAINE HCL (CARDIAC) PF 100 MG/5ML IV SOSY
PREFILLED_SYRINGE | INTRAVENOUS | Status: DC | PRN
  Administered 2018-05-30: 100 mg via INTRAVENOUS

## 2018-05-30 MED ORDER — 0.9 % SODIUM CHLORIDE (POUR BTL) OPTIME
TOPICAL | Status: DC | PRN
  Administered 2018-05-30: 1000 mL

## 2018-05-30 MED ORDER — HEPARIN SODIUM (PORCINE) 1000 UNIT/ML IJ SOLN
INTRAMUSCULAR | Status: AC
  Filled 2018-05-30: qty 1

## 2018-05-30 MED ORDER — LIDOCAINE HCL (PF) 1 % IJ SOLN
INTRAMUSCULAR | Status: AC
  Filled 2018-05-30: qty 30

## 2018-05-30 MED ORDER — ONDANSETRON HCL 4 MG/2ML IJ SOLN: 4.0000 mg | Freq: Once | INTRAMUSCULAR | Status: DC | PRN

## 2018-05-30 MED ORDER — OXYCODONE HCL 5 MG PO TABS: 5.0000 mg | ORAL_TABLET | ORAL | 0 refills | Status: DC | PRN

## 2018-05-30 MED ORDER — SODIUM CHLORIDE 0.9 % IV SOLN
INTRAVENOUS | Status: DC
  Administered 2018-05-30: 07:00:00 via INTRAVENOUS

## 2018-05-30 MED ORDER — DEXAMETHASONE SODIUM PHOSPHATE 10 MG/ML IJ SOLN
INTRAMUSCULAR | Status: DC | PRN
  Administered 2018-05-30: 5 mg via INTRAVENOUS

## 2018-05-30 MED ORDER — SODIUM CHLORIDE 0.9 % IV SOLN
INTRAVENOUS | Status: AC
Start: 2018-05-30 — End: ?
  Filled 2018-05-30: qty 1.2

## 2018-05-30 MED ORDER — HEPARIN SODIUM (PORCINE) 5000 UNIT/ML IJ SOLN
INTRAMUSCULAR | Status: DC | PRN
  Administered 2018-05-30: 08:00:00

## 2018-05-30 MED ORDER — ONDANSETRON HCL 4 MG/2ML IJ SOLN
INTRAMUSCULAR | Status: DC | PRN
  Administered 2018-05-30: 4 mg via INTRAVENOUS

## 2018-05-30 MED ORDER — FENTANYL CITRATE (PF) 100 MCG/2ML IJ SOLN: 25.0000 ug | INTRAMUSCULAR | Status: DC | PRN

## 2018-05-30 MED ORDER — FENTANYL CITRATE (PF) 100 MCG/2ML IJ SOLN
INTRAMUSCULAR | Status: DC | PRN
  Administered 2018-05-30 (×2): 100 ug via INTRAVENOUS

## 2018-05-30 MED ORDER — OXYCODONE HCL 5 MG PO TABS: 5.0000 mg | ORAL_TABLET | Freq: Once | ORAL | Status: DC | PRN

## 2018-05-30 MED ORDER — PROPOFOL 10 MG/ML IV BOLUS
INTRAVENOUS | Status: AC
  Filled 2018-05-30: qty 20

## 2018-05-30 SURGICAL SUPPLY — 38 items
ADH SKN CLS APL DERMABOND .7 (GAUZE/BANDAGES/DRESSINGS) ×1
ARMBAND PINK RESTRICT EXTREMIT (MISCELLANEOUS) ×3 IMPLANT
CANISTER SUCT 3000ML PPV (MISCELLANEOUS) ×3 IMPLANT
CANNULA VESSEL 3MM 2 BLNT TIP (CANNULA) ×3 IMPLANT
CLIP VESOCCLUDE MED 6/CT (CLIP) ×3 IMPLANT
CLIP VESOCCLUDE SM WIDE 6/CT (CLIP) ×3 IMPLANT
COVER PROBE W GEL 5X96 (DRAPES) ×3 IMPLANT
DECANTER SPIKE VIAL GLASS SM (MISCELLANEOUS) ×5 IMPLANT
DERMABOND ADVANCED (GAUZE/BANDAGES/DRESSINGS) ×2
DERMABOND ADVANCED .7 DNX12 (GAUZE/BANDAGES/DRESSINGS) ×1 IMPLANT
DRSG TEGADERM 4X4.75 (GAUZE/BANDAGES/DRESSINGS) ×2 IMPLANT
ELECT REM PT RETURN 9FT ADLT (ELECTROSURGICAL) ×3
ELECTRODE REM PT RTRN 9FT ADLT (ELECTROSURGICAL) ×1 IMPLANT
GLOVE BIO SURGEON STRL SZ7.5 (GLOVE) ×3 IMPLANT
GLOVE BIOGEL PI IND STRL 6.5 (GLOVE) IMPLANT
GLOVE BIOGEL PI IND STRL 7.0 (GLOVE) IMPLANT
GLOVE BIOGEL PI IND STRL 8 (GLOVE) ×1 IMPLANT
GLOVE BIOGEL PI INDICATOR 6.5 (GLOVE) ×2
GLOVE BIOGEL PI INDICATOR 7.0 (GLOVE) ×2
GLOVE BIOGEL PI INDICATOR 8 (GLOVE) ×2
GLOVE SURG SS PI 6.5 STRL IVOR (GLOVE) ×2 IMPLANT
GOWN STRL REUS W/ TWL LRG LVL3 (GOWN DISPOSABLE) ×3 IMPLANT
GOWN STRL REUS W/TWL LRG LVL3 (GOWN DISPOSABLE) ×9
KIT BASIN OR (CUSTOM PROCEDURE TRAY) ×3 IMPLANT
KIT TURNOVER KIT B (KITS) ×3 IMPLANT
NS IRRIG 1000ML POUR BTL (IV SOLUTION) ×3 IMPLANT
PACK CV ACCESS (CUSTOM PROCEDURE TRAY) ×3 IMPLANT
PAD ARMBOARD 7.5X6 YLW CONV (MISCELLANEOUS) ×6 IMPLANT
SPONGE SURGIFOAM ABS GEL 100 (HEMOSTASIS) IMPLANT
SUT MNCRL AB 4-0 PS2 18 (SUTURE) ×2 IMPLANT
SUT PROLENE 5 0 C 1 24 (SUTURE) ×6 IMPLANT
SUT PROLENE 6 0 BV (SUTURE) ×3 IMPLANT
SUT VIC AB 3-0 SH 27 (SUTURE) ×3
SUT VIC AB 3-0 SH 27X BRD (SUTURE) ×1 IMPLANT
SUT VICRYL 4-0 PS2 18IN ABS (SUTURE) ×3 IMPLANT
TOWEL GREEN STERILE (TOWEL DISPOSABLE) ×3 IMPLANT
UNDERPAD 30X30 (UNDERPADS AND DIAPERS) ×3 IMPLANT
WATER STERILE IRR 1000ML POUR (IV SOLUTION) ×3 IMPLANT

## 2018-05-30 NOTE — Telephone Encounter (Signed)
sch appt spk to pt 07/13/18 LC 1130 am p/o MD

## 2018-05-30 NOTE — Op Note (Signed)
    NAME: [name]    MRN: [id number] DOB: [birth year]    DATE OF OPERATION: 05/30/2018  PREOP DIAGNOSIS:    Aneurysmal left upper arm fistula  POSTOP DIAGNOSIS:    Same  PROCEDURE:    Plication of left upper arm fistula  SURGEON: Judeth Cornfield. Scot Dock, MD, FACS  ASSIST: Laurence Slate, PA  ANESTHESIA: General  EBL: Minimal  INDICATIONS:    [name] is a 37 y.o. female who has a large aneurysmal left upper arm fistula.  This is a basilic vein transposition.  The skin overlying the more central aneurysm was standing and she presents for plication.  FINDINGS:   Excellent thrill in the fistula at the completion of the procedure.  TECHNIQUE:   The patient was taken to the operating room and received a general anesthetic.  The left arm was prepped and draped in usual sterile fashion.  An elliptical incision was made over the central aneurysm and the aneurysm was dissected free circumferentially.  This was quite large.  The fistula had areas of significant calcification.  The patient was heparinized.  The fistula was clamped proximally distally.  I excised a large ellipse of the aneurysmal segment.  I was unable to excise this from the posterior aspect of the fistula as the anterior aspect was thin and diseased.  I excised the weaker segment of the fistula.  The fistula was then sewn back with running 5-0 Prolene suture.  Clamps were then released.  In order to try to rotate the suture line away from the site of cannulation using several 6-0 Prolene's the fistula was right rolled slightly intact in this position so that the suture line was not anterior.  The wound over this was then closed with a 3-0 Vicryl and a 4-0 Vicryl.  Dermabond was applied.  The patient tolerated the procedure well and was transferred to the recovery room in stable condition.  All needle and sponge counts were correct.  Deitra Mayo, MD, FACS [facility]  DATE OF  DICTATION:   05/30/2018

## 2018-05-30 NOTE — H&P (Signed)
Patient name: [name] MRN: [id number] DOB: 10-Sep-1981 Sex: female  REASON FOR VISIT:    For plication of left upper arm fistula  HPI:   [name] is a pleasant 37 y.o. female, who I last saw on 03/23/2018.  She has a left basilic vein transposition that was placed in 2015.  She dialyzes on Tuesdays Thursdays and Saturdays.  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  The more central portion of the fistula was the area that had the bleeding issues.  The fistula had a good thrill but was aneurysmal throughout however the skin was stand over the more centrally located aneurysm.  It was noted to be some calcium in the vein near the axilla.  I felt that ultimately she will likely need new access, however I felt that it would be reasonable to continue to try to get as much mileage as possible out of this fistula.  I recommended plicating the more centrally located aneurysm so that there would still be room to cannulate the fistula below this area.  Once the new area could be used then potentially she could have a staged plication of the more distally located aneurysm.  Her procedure was originally scheduled on 03/28/2018, however her drug screen is positive and the surgery was canceled.  She presents again for surgery.  Past Medical History:  Diagnosis Date  . Anemia    low iron - receives iron at dialysis  . Chronic systolic congestive heart failure ([place]) 03/16/2016  . Dyspnea   . ESRD (end stage renal disease) ([place])    [facility] _ [place]  . H/O pericarditis 01/17/2013  . H/O pleural effusion 01/17/2013  . Heart murmur   . Lupus (systemic lupus erythematosus) ([facility])    Previously followed with Dr. Charlestine Night, has not followed up recently  . 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 Pneumonia    many times  . Polysubstance abuse ([place])    cocaine, MJ, tobacco  . S/P pericardiocentesis 01/17/2013   H/o pericardial effusion with tamponade 2006   . Seizures ([place])    during pregnancy 1 time    History reviewed. No pertinent family history.  SOCIAL HISTORY: Social History   Socioeconomic History  . Marital status: Single    Spouse name: Not on file  . Number of children: Not on file  . Years of education: Not on file  . Highest education level: Not on file  Occupational History  . Not on file  Social Needs  . Financial resource strain: Not on file  . Food insecurity:    Worry: Not on file    Inability: Not on file  . Transportation needs:    Medical: Not on file    Non-medical: Not on file  Tobacco Use  . Smoking status: Current Every Day Smoker    Packs/day: 0.12    Years: 15.00    Pack years: 1.80    Types: Cigarettes  . Smokeless tobacco: Never Used  . Tobacco comment: quit smoking s/p hospital discharge/ SMOKES ABOUT 2-3 CIGARETTES A DAY  Substance and Sexual Activity  . Alcohol use: Yes    Alcohol/week: 0.0 oz    Comment: Special Occasional  . Drug use: Yes    Frequency: 5.0 times per week    Types: Marijuana, Cocaine  Comment: Marijuana last time mid May 2019, Cocaine- last tile mid April 2019  . Sexual activity: Yes  Lifestyle  . Physical activity:    Days per week: Not on file    Minutes per session: Not on file  . Stress: Not on file  Relationships  . Social connections:    Talks on phone: Not on file    Gets together: Not on file    Attends religious service: Not on file    Active member of club or organization: Not on file    Attends meetings of clubs or organizations: Not on file    Relationship status: Not on file  . Intimate partner violence:    Fear of current or ex partner: Not on file    Emotionally abused: Not on file    Physically abused: Not on file    Forced sexual activity: Not on file  Other Topics Concern  . Not on file    Social History Narrative  . Not on file    Allergies  Allergen Reactions  . Tobramycin Sulfate Swelling    Current Facility-Administered Medications  Medication Dose Route Frequency Provider Last Rate Last Dose  . 0.9 %  sodium chloride infusion   Intravenous Continuous Angelia Mould, MD      . ceFAZolin (ANCEF) IVPB 2g/100 mL premix  2 g Intravenous 30 min Pre-Op Angelia Mould, MD      . fentaNYL (SUBLIMAZE) injection 25-50 mcg  25-50 mcg Intravenous Q5 min PRN Hatchett, Mateo Flow, MD      . meperidine (DEMEROL) injection 6.25-12.5 mg  6.25-12.5 mg Intravenous Q5 min PRN Hatchett, Mateo Flow, MD        REVIEW OF SYSTEMS:  [X]  denotes positive finding, [ ]  denotes negative finding Cardiac  Comments:  Chest pain or chest pressure:    Shortness of breath upon exertion:    Short of breath when lying flat:    Irregular heart rhythm:        Vascular    Pain in calf, thigh, or hip brought on by ambulation:    Pain in feet at night that wakes you up from your sleep:     Blood clot in your veins:    Leg swelling:         Pulmonary    Oxygen at home:    Productive cough:     Wheezing:         Neurologic    Sudden weakness in arms or legs:     Sudden numbness in arms or legs:     Sudden onset of difficulty speaking or slurred speech:    Temporary loss of vision in one eye:     Problems with dizziness:         Gastrointestinal    Blood in stool:     Vomited blood:         Genitourinary    Burning when urinating:     Blood in urine:        Psychiatric    Major depression:         Hematologic    Bleeding problems: x   Problems with blood clotting too easily:        Skin    Rashes or ulcers:        Constitutional    Fever or chills:     PHYSICAL EXAM:   Vitals:   05/30/18 0604 05/30/18 0605  BP: (!) 128/91   Pulse:  91  Resp:  16   Temp: 98.3 F (36.8 C)   TempSrc: Oral   SpO2:  100%  Weight: 103 lb 9.9 oz (47 kg)   Height: 5\' 5"  (1.651 m)      GENERAL: The patient is a well-nourished female, in no acute distress. The vital signs are documented above. CARDIAC: There is a regular rate and rhythm.  VASCULAR: Her fistula is aneurysmal throughout.  Has a good thrill. PULMONARY: There is good air exchange bilaterally without wheezing or rales. MUSCULOSKELETAL: There are no major deformities or cyanosis. NEUROLOGIC: No focal weakness or paresthesias are detected. SKIN: There are no ulcers or rashes noted. PSYCHIATRIC: The patient has a normal affect.  DATA:    LABS: Potassium is 4.8.  Hemoglobin is 10.5.  MEDICAL ISSUES:   ANEURYSMAL LEFT UPPER ARM FISTULA: Patient presents for plication of her left upper arm fistula.  I will address the more centrally located aneurysm.  The procedure and risks have been discussed with the patient and she is agreeable to proceed.   Deitra Mayo [facility] [phone]

## 2018-05-30 NOTE — Transfer of Care (Addendum)
Immediate Anesthesia Transfer of Care Note  Patient: [name]  Procedure(s) Performed: FISTULA PLICATION BASILIC VEIN TRANSPOSITION (Left Arm Lower)  Patient Location: PACU  Anesthesia Type:General  Level of Consciousness: awake, oriented, sedated and patient cooperative  Airway & Oxygen Therapy: Patient Spontanous Breathing and Patient connected to nasal cannula oxygen  Post-op Assessment: Report given to RN and Post -op Vital signs reviewed and stable  Post vital signs: Reviewed and stable  Last Vitals:  Vitals Value Taken Time  BP 115/103 05/30/2018  9:29 AM  Temp    Pulse 86 05/30/2018  9:34 AM  Resp 23 05/30/2018  9:34 AM  SpO2 98 % 05/30/2018  9:34 AM  Vitals shown include unvalidated device data.  Last Pain:  Vitals:   05/30/18 0934  TempSrc:   PainSc: (P) 0-No pain         Complications: No apparent anesthesia complications

## 2018-05-30 NOTE — Anesthesia Procedure Notes (Signed)
Procedure Name: LMA Insertion Date/Time: 05/30/2018 7:57 AM Performed by: Izora Gala, CRNA Pre-anesthesia Checklist: Patient identified, Emergency Drugs available, Suction available and Patient being monitored Patient Re-evaluated:Patient Re-evaluated prior to induction Oxygen Delivery Method: Circle system utilized Preoxygenation: Pre-oxygenation with 100% oxygen Induction Type: IV induction Ventilation: Mask ventilation without difficulty LMA Size: 4.0 Number of attempts: 1 Placement Confirmation: positive ETCO2 and breath sounds checked- equal and bilateral Tube secured with: Tape Dental Injury: Teeth and Oropharynx as per pre-operative assessment

## 2018-05-30 NOTE — Anesthesia Postprocedure Evaluation (Signed)
Anesthesia Post Note  Patient: [name]  Procedure(s) Performed: FISTULA PLICATION BASILIC VEIN TRANSPOSITION (Left Arm Lower)     Patient location during evaluation: PACU Anesthesia Type: General Level of consciousness: awake and alert Pain management: pain level controlled Vital Signs Assessment: post-procedure vital signs reviewed and stable Respiratory status: spontaneous breathing, nonlabored ventilation, respiratory function stable and patient connected to nasal cannula oxygen Cardiovascular status: blood pressure returned to baseline and stable Postop Assessment: no apparent nausea or vomiting Anesthetic complications: no    Last Vitals:  Vitals:   05/30/18 0956 05/30/18 1017  BP:  126/87  Pulse:  86  Resp:  18  Temp: 36.6 C   SpO2:  99%    Last Pain:  Vitals:   05/30/18 1017  TempSrc:   PainSc: 2                  Milt Coye COKER

## 2018-05-30 NOTE — [place] Notes (Signed)
Only able to void small amt. Urine obtained and sent to lab for drug screen as ordered. Serum Preg drawn due to not enough urine to do urine Preg.

## 2018-05-30 NOTE — Anesthesia Preprocedure Evaluation (Addendum)
Anesthesia Evaluation  Patient identified by MRN, date of birth, ID band Patient awake    Reviewed: Allergy & Precautions, NPO status , Patient's Chart, lab work & pertinent test results  Airway Mallampati: II  TM Distance: >3 FB Neck ROM: Full    Dental  (+) Teeth Intact, Dental Advisory Given   Pulmonary Current Smoker,    breath sounds clear to auscultation       Cardiovascular  Rhythm:Regular Rate:Normal     Neuro/Psych    GI/Hepatic   Endo/Other    Renal/GU      Musculoskeletal   Abdominal   Peds  Hematology   Anesthesia Other Findings   Reproductive/Obstetrics                             Anesthesia Physical Anesthesia Plan  ASA: III  Anesthesia Plan: General   Post-op Pain Management:    Induction: Intravenous  PONV Risk Score and Plan: 1 and Ondansetron and Dexamethasone  Airway Management Planned: LMA  Additional Equipment:   Intra-op Plan:   Post-operative Plan:   Informed Consent: I have reviewed the patients History and Physical, chart, labs and discussed the procedure including the risks, benefits and alternatives for the proposed anesthesia with the patient or authorized representative who has indicated his/her understanding and acceptance.   Dental advisory given  Plan Discussed with: Anesthesiologist and CRNA  Anesthesia Plan Comments: (Serum drug screen positive for cocaine. Patient denies using cocaine for at least two weeks. She does not appear intoxicated. I discussed with [name]  the findings and our concerns . I stated that there is increased risk in doing the procedure if she has used cocaine with the last 48-72 hours. She understands the risk and agrees to proceed.  [name])       Anesthesia Quick Evaluation

## 2018-05-31 ENCOUNTER — Encounter (HOSPITAL_COMMUNITY): Payer: Self-pay | Admitting: Vascular Surgery

## 2018-06-21 ENCOUNTER — Encounter (HOSPITAL_COMMUNITY): Payer: Self-pay

## 2018-06-21 ENCOUNTER — Emergency Department (HOSPITAL_COMMUNITY): Payer: Medicaid Other

## 2018-06-21 ENCOUNTER — Emergency Department (HOSPITAL_COMMUNITY)
Admission: EM | Admit: 2018-06-21 | Discharge: 2018-06-21 | Disposition: A | Discharge status: Home / Self Care | Payer: Medicaid Other | Attending: Emergency Medicine | Admitting: Emergency Medicine

## 2018-06-21 DIAGNOSIS — R1110 Vomiting, unspecified: Secondary | ICD-10-CM | POA: Insufficient documentation

## 2018-06-21 DIAGNOSIS — R002 Palpitations: Secondary | ICD-10-CM | POA: Diagnosis not present

## 2018-06-21 DIAGNOSIS — R9431 Abnormal electrocardiogram [ECG] [EKG]: Secondary | ICD-10-CM | POA: Diagnosis not present

## 2018-06-21 DIAGNOSIS — R05 Cough: Secondary | ICD-10-CM | POA: Insufficient documentation

## 2018-06-21 DIAGNOSIS — R0602 Shortness of breath: Secondary | ICD-10-CM | POA: Insufficient documentation

## 2018-06-21 DIAGNOSIS — F1410 Cocaine abuse, uncomplicated: Secondary | ICD-10-CM | POA: Diagnosis not present

## 2018-06-21 DIAGNOSIS — Z992 Dependence on renal dialysis: Secondary | ICD-10-CM | POA: Diagnosis not present

## 2018-06-21 DIAGNOSIS — R000 Tachycardia, unspecified: Secondary | ICD-10-CM | POA: Diagnosis not present

## 2018-06-21 DIAGNOSIS — R079 Chest pain, unspecified: Secondary | ICD-10-CM | POA: Diagnosis present

## 2018-06-21 DIAGNOSIS — Z79899 Other long term (current) drug therapy: Secondary | ICD-10-CM | POA: Diagnosis not present

## 2018-06-21 DIAGNOSIS — N186 End stage renal disease: Secondary | ICD-10-CM | POA: Insufficient documentation

## 2018-06-21 DIAGNOSIS — F17210 Nicotine dependence, cigarettes, uncomplicated: Secondary | ICD-10-CM | POA: Diagnosis not present

## 2018-06-21 DIAGNOSIS — I4891 Unspecified atrial fibrillation: Secondary | ICD-10-CM | POA: Diagnosis not present

## 2018-06-21 DIAGNOSIS — F1210 Cannabis abuse, uncomplicated: Secondary | ICD-10-CM | POA: Insufficient documentation

## 2018-06-21 DIAGNOSIS — I5022 Chronic systolic (congestive) heart failure: Secondary | ICD-10-CM | POA: Insufficient documentation

## 2018-06-21 LAB — I-STAT TROPONIN, ED
TROPONIN I, POC: 0.04 ng/mL (ref 0.00–0.08)
Troponin i, poc: 0.05 ng/mL (ref 0.00–0.08)

## 2018-06-21 LAB — BASIC METABOLIC PANEL
Anion gap: 17 — ABNORMAL HIGH (ref 5–15)
BUN: 49 mg/dL — ABNORMAL HIGH (ref 6–20)
CALCIUM: 8.9 mg/dL (ref 8.9–10.3)
CO2: 28 mmol/L (ref 22–32)
Chloride: 95 mmol/L — ABNORMAL LOW (ref 98–111)
Creatinine, Ser: 9.27 mg/dL — ABNORMAL HIGH (ref 0.44–1.00)
GFR, EST AFRICAN AMERICAN: 6 mL/min — AB (ref >60)
GFR, EST NON AFRICAN AMERICAN: 5 mL/min — AB (ref >60)
GLUCOSE: 126 mg/dL — AB (ref 70–99)
Potassium: 3.8 mmol/L (ref 3.5–5.1)
Sodium: 140 mmol/L (ref 135–145)

## 2018-06-21 LAB — CBC
HCT: 39.4 % (ref 36.0–46.0)
Hemoglobin: 11.5 g/dL — ABNORMAL LOW (ref 12.0–15.0)
MCH: 27.5 pg (ref 26.0–34.0)
MCHC: 29.2 g/dL — ABNORMAL LOW (ref 30.0–36.0)
MCV: 94.3 fL (ref 78.0–100.0)
PLATELETS: 203 10*3/uL (ref 150–400)
RBC: 4.18 MIL/uL (ref 3.87–5.11)
RDW: 23.9 % — ABNORMAL HIGH (ref 11.5–15.5)
WBC: 4 10*3/uL (ref 4.0–10.5)

## 2018-06-21 LAB — I-STAT BETA HCG BLOOD, ED (MC, WL, AP ONLY): I-stat hCG, quantitative: <5 m[IU]/mL (ref <5)

## 2018-06-21 NOTE — ED Triage Notes (Signed)
Pt states that about 30 minutes ago she began to have sudden onset of CP and SOB, hx of SVT, feels like this is the same, dialysis pt, last today, received all but 30 minutes, vomited x 1 in triage. Pain radiating to jaw

## 2018-06-21 NOTE — ED Notes (Signed)
Patient transported to X-ray 

## 2018-06-21 NOTE — Discharge Instructions (Signed)
Your evaluated in the emergency department for a rapid heartbeat associated with some jaw pain and shortness of breath.  You were in a new kind of heart rhythm called atrial fibrillation.  You spontaneously come out of that rhythm during her time at the emergency department.  We also found that the spacing of the electricity on your EKG was abnormal.  You will need to follow-up with her cardiologist and we are giving you the number for their clinic.  Please also let your primary care doctor and your kidney doctor know about this.  If you have any worsening of your symptoms please return to the emergency department.

## 2018-06-21 NOTE — ED Provider Notes (Signed)
[facility] EMERGENCY DEPARTMENT Provider Note   CSN: [id number] Arrival date & time: 06/21/18  1914     History   Chief Complaint Chief Complaint  Patient presents with  . Chest Pain    HPI [name] is a 37 y.o. female.  She has a history of end-stage renal disease CHF and one prior episode of SVT.  She gets dialysis Tuesday Thursday Saturday and just completed dialysis today.  She ended up skipping Saturdays treatments.  She completed her dialysis today around noon.  She was at home watching TV when she felt acute onset of a rapid heartbeat and some tightness in her jaw and feeling short of breath.  She states she felt a lot like when she had her SVT.  She says between showing up in triage and coming back to the emergency department room her symptoms have improved.  Reportedly vomited once in triage.  Right now she feels cold a little twitchy but otherwise has no jaw discomfort no shortness of breath and does not feel her heartbeat reading fast now.  She states no recent illnesses and no change in her medications.  The history is provided by the patient.  Chest Pain   This is a new problem. The current episode started 1 to 2 hours ago. The problem has been rapidly improving. The pain is associated with rest. Pain location: jaw. The pain is moderate. The quality of the pain is described as pressure-like. The pain does not radiate. Associated symptoms include cough, palpitations, shortness of breath and vomiting. Pertinent negatives include no abdominal pain, no back pain, no fever, no leg pain and no sputum production. She has tried nothing for the symptoms. The treatment provided no relief.  Her past medical history is significant for arrhythmia.  Pertinent negatives for past medical history include no seizures.    Past Medical History:  Diagnosis Date  . Anemia    low iron - receives iron at dialysis  . Chronic systolic congestive heart failure ([place]) 03/16/2016   . Dyspnea   . ESRD (end stage renal disease) ([place])    [facility] _ [place]  . H/O pericarditis 01/17/2013  . H/O pleural effusion 01/17/2013  . Heart murmur   . Lupus (systemic lupus erythematosus) (H[facility])    Previously followed with Dr. Charlestine Night, has not followed up recently  . 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 Pneumonia    many times  . Polysubstance abuse ([place])    cocaine, MJ, tobacco  . S/P pericardiocentesis 01/17/2013   H/o pericardial effusion with tamponade 2006   . Seizures ([place])    during pregnancy 1 time    Patient Active Problem List   Diagnosis Date Noted  . ESRD (end stage renal disease) on dialysis ([place]) 02/17/2017  . SOB (shortness of breath) 02/16/2017  . Chronic systolic congestive heart failure ([place]) 03/16/2016  . Cough   . Ascites   . Follow up   . Other hypervolemia   . S/P thoracentesis   . Abdominal pain   . Pleural effusion   . Hemoptysis   . Myalgia   . Pulmonary edema   . Crack cocaine overdose ([place])   . ESRD (end stage renal disease) ([place]) 03/03/2016  . Hypertensive emergency 03/03/2016  . Acute bronchitis 02/03/2016  . Volume overload 11/16/2015  . Shortness of breath 11/16/2015  .  Acute UTI   . Hyperkalemia 09/25/2015  . Rash and nonspecific skin eruption 06/26/2015  . Secondary Raynaud's phenomenon 06/24/2015  . Cramping of hands 04/19/2014  . Unspecified contraceptive management 04/19/2014  . End stage renal disease ([place]) 03/27/2014  . Insomnia 03/15/2014  . Benign hypertension with ESRD (end-stage renal disease) ([place]) 03/15/2014  . Tobacco abuse 02/15/2014  . Healthcare maintenance 02/15/2014  . Hypoalbuminemia 02/01/2014  . Nephrotic syndrome 02/01/2014  . ESRD on dialysis ([place]) 01/31/2014  . Pleural effusion, left 01/31/2014  . Metabolic acidosis, increased anion gap  (IAG) 01/31/2014  . Microcytic anemia 01/29/2014  . Hypocalcemia 01/29/2014  . Streptococcal bacteremia 01/23/2013  . Cocaine abuse ([place]) 01/18/2013  . Marijuana smoker ([place]) 01/18/2013  . H/O pericarditis 01/17/2013  . SLE (systemic lupus erythematosus) ([place]) 01/17/2013  . Lupus nephritis ([place]) 01/17/2013  . S/P pericardiocentesis 01/17/2013  . H/O pleural effusion 01/17/2013  . Nephrosis 01/17/2013  . Trichomoniasis 01/17/2013  . Preseptal cellulitis 01/17/2013    Past Surgical History:  Procedure Laterality Date  . AV FISTULA PLACEMENT    . BASCILIC VEIN TRANSPOSITION Left 02/05/2014   Procedure: [place];  Surgeon: [name], MD;  Location: [facility];  Service: Vascular;  Laterality: Left;  . FISTULA SUPERFICIALIZATION Left 05/30/2018   Procedure: FISTULA PLICATION BASILIC VEIN TRANSPOSITION;  Surgeon: Angelia Mould, MD;  Location: [facility];  Service: Vascular;  Laterality: Left;  Marland Kitchen VENOGRAM Right 01/31/2014   Procedure: DIALYSIS CATHETER;  Surgeon: Serafina Mitchell, MD;  Location: [facility] CATH LAB;  Service: Cardiovascular;  Laterality: Right;     OB History   None      Home Medications    Prior to Admission medications   Medication Sig Start Date End Date Taking? Authorizing Provider  albuterol (PROVENTIL HFA;VENTOLIN HFA) 108 (90 Base) MCG/ACT inhaler Inhale 1-2 puffs into the lungs every 6 (six) hours as needed for wheezing or shortness of breath. 02/03/16   Lucious Groves, DO  amoxicillin-clavulanate (AUGMENTIN) 875-125 MG tablet Take 1 tablet by mouth daily. For 7 days.    [provider]  calcium acetate (PHOSLO) 667 MG capsule Take 2,001-4,002 mg by mouth See admin instructions. 4,002 mg three times a day with each meal and 2,001 mg with each snack    [provider]  carvedilol (COREG) 3.125 MG tablet Take 1 tablet (3.125 mg total) by mouth 2 (two) times daily with a meal. 05/21/18   Cardama, Grayce Sessions, MD  darbepoetin (ARANESP) 60  MCG/0.3ML SOLN injection Inject 0.3 mLs (60 mcg total) into the vein every Wednesday with hemodialysis. Patient taking differently: Inject 60 mcg into the vein every 7 (seven) days.  02/08/14   Jessee Avers, MD  diphenhydrAMINE (BENADRYL) 50 MG tablet Take 50 mg by mouth every 6 (six) hours as needed for itching.    [provider]  ferric gluconate 62.5 mg in sodium chloride 0.9 % 100 mL Inject 62.5 mg into the vein every Thursday with hemodialysis. Patient taking differently: Inject 62.5 mg into the vein every Saturday with hemodialysis.  03/09/16   Rice, Resa Miner, MD  hydrALAZINE (APRESOLINE) 25 MG tablet Take 1 tablet (25 mg total) by mouth 3 (three) times daily. Patient not taking: Reported on 03/23/2018 02/18/17   Holley Raring, MD  hydroxychloroquine (PLAQUENIL) 200 MG tablet Take 2 tablets (400 mg total) by mouth daily. Patient not taking: Reported on 03/23/2018 02/18/17   Holley Raring, MD  isosorbide mononitrate (IMDUR) 30 MG 24 hr tablet Take  1 tablet (30 mg total) by mouth daily. Patient not taking: Reported on 03/23/2018 02/18/17   Holley Raring, MD  multivitamin (RENA-VIT) TABS tablet Take 1 tablet by mouth daily. 02/18/17   Holley Raring, MD  Nutritional Supplements (FEEDING SUPPLEMENT, NEPRO CARB STEADY,) LIQD Take 237 mLs by mouth See admin instructions. At every dialysis treatment on Tues/Thurs/Sat    [provider]  oxyCODONE (ROXICODONE) 5 MG immediate release tablet Take 1 tablet (5 mg total) by mouth every 4 (four) hours as needed for severe pain. 05/30/18   Angelia Mould, MD  triamcinolone cream (KENALOG) 0.1 % Apply 1 application topically 2 (two) times daily. Patient not taking: Reported on 02/16/2017 06/24/15   Lucious Groves, DO    Family History No family history on file.  Social History Social History   Tobacco Use  . Smoking status: Current Every Day Smoker    Packs/day: 0.12    Years: 15.00    Pack years: 1.80    Types:  Cigarettes  . Smokeless tobacco: Never Used  . Tobacco comment: quit smoking s/p hospital discharge/ SMOKES ABOUT 2-3 CIGARETTES A DAY  Substance Use Topics  . Alcohol use: Yes    Alcohol/week: 0.0 oz    Comment: Special Occasional  . Drug use: Yes    Frequency: 5.0 times per week    Types: Marijuana, Cocaine    Comment: Marijuana last time mid May 2019, Cocaine- last tile mid April 2019     Allergies   Tobramycin sulfate   Review of Systems Review of Systems  Constitutional: Negative for fever.  HENT: Negative for rhinorrhea and sore throat.   Eyes: Negative for visual disturbance.  Respiratory: Positive for cough and shortness of breath. Negative for sputum production.   Cardiovascular: Positive for chest pain and palpitations.  Gastrointestinal: Positive for vomiting. Negative for abdominal pain.  Genitourinary: Negative for dysuria.  Musculoskeletal: Negative for back pain and neck pain.  Skin: Negative for rash.  Neurological: Negative for seizures.     Physical Exam Updated Vital Signs BP 100/83   Pulse 90   Resp 20   LMP  (LMP Unknown) Comment: states over 2 years ago  SpO2 100%   Physical Exam  Constitutional: She appears well-developed and well-nourished. No distress.  HENT:  Head: Normocephalic and atraumatic.  Eyes: Pupils are equal, round, and reactive to light. Conjunctivae and EOM are normal.  Neck: Normal range of motion. Neck supple.  Cardiovascular: Normal rate, regular rhythm and normal pulses.  No murmur heard. Pulmonary/Chest: Effort normal and breath sounds normal. No respiratory distress.  Abdominal: Soft. There is no tenderness.  Musculoskeletal: She exhibits no edema.       Right lower leg: She exhibits no tenderness and no edema.       Left lower leg: She exhibits no tenderness and no edema.  Fistula left upper arm with positive thrill.  Neurological: She is alert.  Skin: Skin is warm and dry. Capillary refill takes less than 2 seconds.   Psychiatric: She has a normal mood and affect.  Nursing note and vitals reviewed.    ED Treatments / Results  Labs (all labs ordered are listed, but only abnormal results are displayed) Labs Reviewed  BASIC METABOLIC PANEL - Abnormal; Notable for the following components:      Result Value   Chloride 95 (*)    Glucose, Bld 126 (*)    BUN 49 (*)    Creatinine, Ser 9.27 (*)    GFR  calc non Af Amer 5 (*)    GFR calc Af Amer 6 (*)    Anion gap 17 (*)    All other components within normal limits  CBC - Abnormal; Notable for the following components:   Hemoglobin 11.5 (*)    MCHC 29.2 (*)    RDW 23.9 (*)    All other components within normal limits  I-STAT TROPONIN, ED  I-STAT BETA HCG BLOOD, ED ([facility], [facility], [facility] ONLY)  I-STAT TROPONIN, ED    EKG EKG Interpretation  Date/Time:  Tuesday June 21 2018 19:56:28 EDT Ventricular Rate:  88 PR Interval:    QRS Duration: 94 QT Interval:  424 QTC Calculation: 513 R Axis:   77 Text Interpretation:  Sinus rhythm Right atrial enlargement LVH with secondary repolarization abnormality Prolonged QT interval sinus replaces aflutter on prior today Confirmed by Aletta Edouard [phone]) on 06/21/2018 8:00:30 PM Also confirmed by Aletta Edouard [phone]), editor Hattie Perch ([id number])  on 06/22/2018 7:24:37 AM   Radiology Dg Chest 2 View  Result Date: 06/21/2018 CLINICAL DATA:  Shortness of breath, chest pain and cough for 1 day. End-stage renal disease. EXAM: CHEST - 2 VIEW COMPARISON:  02/16/2017 and prior exams FINDINGS: Cardiomegaly noted. RIGHT middle lobe and lingular opacities noted and may represent pneumonia. Trace bilateral pleural effusions noted. There is no evidence of pneumothorax or acute bony abnormality. IMPRESSION: RIGHT middle lobe and lingular opacities which may represent pneumonia. Trace bilateral pleural effusions. Cardiomegaly. Electronically Signed   By: Margarette Canada M.D.   On: 06/21/2018 20:54    Procedures Procedures  (including critical care time)  Medications Ordered in ED Medications - No data to display   Initial Impression / Assessment and Plan / ED Course  I have reviewed the triage vital signs and the nursing notes.  Pertinent labs & imaging results that were available during my care of the patient were reviewed by me and considered in my medical decision making (see chart for details).  Clinical Course as of Jun 22 1022  Tue Jun 21, 2018  2029 Discussed with cardiology who reviewed the EKGs.  He felt the patient was likely in A. fib on that initial EKG and she is return to normal sinus rhythm.  She does have QT prolongation.  He agrees with the current work-up of checking her metabolic's and troponin and to even consider a second troponin in the setting of this is possibly ischemic even though she is young.  If her work-up is otherwise negative she can be discharged to follow-up outpatient with cardiology.   [MB]    Clinical Course User Index [MB] Hayden Rasmussen, MD     Final Clinical Impressions(s) / ED Diagnoses   Final diagnoses:  New onset atrial fibrillation [facility])  Prolonged Q-T interval on ECG    ED Discharge Orders    None       Hayden Rasmussen, MD 06/22/18 1023

## 2018-06-21 NOTE — ED Notes (Signed)
Pt resting with eyes closed.

## 2018-06-22 NOTE — Significant Event (Signed)
Asked to review ECGs by ER: appears to be afib and subsequently converted to sinus rhythm.  Long QT as well which is chronic.  I offered formal consultative services but patient was deemed low risk and asymptomatic and they would navigate the Afib pathway and encouraged to seek outpatient cardiology clinic referral.  I have checked back the next day, and don't see that this has been arranged from what I can tell.  Will forward to the day team to set her up in the Afib clinic.  Zorita Pang, MD Cardiology

## 2018-06-23 ENCOUNTER — Telehealth (HOSPITAL_COMMUNITY): Payer: Self-pay | Admitting: *Deleted

## 2018-06-23 NOTE — Telephone Encounter (Signed)
Lft vcml for pt to [facility] to sched f/u appt from ED

## 2018-06-30 ENCOUNTER — Inpatient Hospital Stay (HOSPITAL_COMMUNITY): Payer: Medicaid Other

## 2018-06-30 ENCOUNTER — Inpatient Hospital Stay (HOSPITAL_COMMUNITY)
Admission: EM | Admit: 2018-06-30 | Discharge: 2018-07-04 | DRG: 291 | Disposition: A | Discharge status: Home / Self Care | Payer: Medicaid Other | Attending: Pulmonary Disease | Admitting: Pulmonary Disease

## 2018-06-30 ENCOUNTER — Other Ambulatory Visit: Payer: Self-pay

## 2018-06-30 ENCOUNTER — Emergency Department (HOSPITAL_COMMUNITY): Payer: Medicaid Other

## 2018-06-30 ENCOUNTER — Encounter (HOSPITAL_COMMUNITY): Payer: Self-pay | Admitting: Emergency Medicine

## 2018-06-30 DIAGNOSIS — I120 Hypertensive chronic kidney disease with stage 5 chronic kidney disease or end stage renal disease: Secondary | ICD-10-CM

## 2018-06-30 DIAGNOSIS — R042 Hemoptysis: Secondary | ICD-10-CM | POA: Diagnosis not present

## 2018-06-30 DIAGNOSIS — F1490 Cocaine use, unspecified, uncomplicated: Secondary | ICD-10-CM | POA: Diagnosis present

## 2018-06-30 DIAGNOSIS — I4581 Long QT syndrome: Secondary | ICD-10-CM | POA: Diagnosis not present

## 2018-06-30 DIAGNOSIS — M3214 Glomerular disease in systemic lupus erythematosus: Secondary | ICD-10-CM | POA: Diagnosis present

## 2018-06-30 DIAGNOSIS — Z79899 Other long term (current) drug therapy: Secondary | ICD-10-CM

## 2018-06-30 DIAGNOSIS — D638 Anemia in other chronic diseases classified elsewhere: Secondary | ICD-10-CM | POA: Diagnosis present

## 2018-06-30 DIAGNOSIS — D649 Anemia, unspecified: Secondary | ICD-10-CM | POA: Diagnosis not present

## 2018-06-30 DIAGNOSIS — F17210 Nicotine dependence, cigarettes, uncomplicated: Secondary | ICD-10-CM | POA: Diagnosis present

## 2018-06-30 DIAGNOSIS — N186 End stage renal disease: Secondary | ICD-10-CM | POA: Diagnosis not present

## 2018-06-30 DIAGNOSIS — J9601 Acute respiratory failure with hypoxia: Secondary | ICD-10-CM | POA: Diagnosis present

## 2018-06-30 DIAGNOSIS — I5023 Acute on chronic systolic (congestive) heart failure: Secondary | ICD-10-CM | POA: Diagnosis present

## 2018-06-30 DIAGNOSIS — I132 Hypertensive heart and chronic kidney disease with heart failure and with stage 5 chronic kidney disease, or end stage renal disease: Principal | ICD-10-CM | POA: Diagnosis present

## 2018-06-30 DIAGNOSIS — I161 Hypertensive emergency: Secondary | ICD-10-CM | POA: Diagnosis present

## 2018-06-30 DIAGNOSIS — E872 Acidosis: Secondary | ICD-10-CM | POA: Diagnosis present

## 2018-06-30 DIAGNOSIS — N2581 Secondary hyperparathyroidism of renal origin: Secondary | ICD-10-CM | POA: Diagnosis present

## 2018-06-30 DIAGNOSIS — Z9119 Patient's noncompliance with other medical treatment and regimen: Secondary | ICD-10-CM | POA: Diagnosis not present

## 2018-06-30 DIAGNOSIS — E875 Hyperkalemia: Secondary | ICD-10-CM | POA: Diagnosis present

## 2018-06-30 DIAGNOSIS — J811 Chronic pulmonary edema: Secondary | ICD-10-CM | POA: Diagnosis not present

## 2018-06-30 DIAGNOSIS — M329 Systemic lupus erythematosus, unspecified: Secondary | ICD-10-CM

## 2018-06-30 DIAGNOSIS — F1290 Cannabis use, unspecified, uncomplicated: Secondary | ICD-10-CM | POA: Diagnosis present

## 2018-06-30 DIAGNOSIS — I471 Supraventricular tachycardia: Secondary | ICD-10-CM

## 2018-06-30 DIAGNOSIS — F1410 Cocaine abuse, uncomplicated: Secondary | ICD-10-CM

## 2018-06-30 DIAGNOSIS — J189 Pneumonia, unspecified organism: Secondary | ICD-10-CM | POA: Diagnosis present

## 2018-06-30 DIAGNOSIS — R0603 Acute respiratory distress: Secondary | ICD-10-CM | POA: Diagnosis present

## 2018-06-30 DIAGNOSIS — H109 Unspecified conjunctivitis: Secondary | ICD-10-CM | POA: Diagnosis present

## 2018-06-30 DIAGNOSIS — F1210 Cannabis abuse, uncomplicated: Secondary | ICD-10-CM

## 2018-06-30 DIAGNOSIS — Z992 Dependence on renal dialysis: Secondary | ICD-10-CM

## 2018-06-30 DIAGNOSIS — I4891 Unspecified atrial fibrillation: Secondary | ICD-10-CM | POA: Diagnosis present

## 2018-06-30 DIAGNOSIS — Z881 Allergy status to other antibiotic agents status: Secondary | ICD-10-CM

## 2018-06-30 DIAGNOSIS — Z9115 Patient's noncompliance with renal dialysis: Secondary | ICD-10-CM | POA: Diagnosis not present

## 2018-06-30 DIAGNOSIS — K920 Hematemesis: Secondary | ICD-10-CM | POA: Diagnosis present

## 2018-06-30 DIAGNOSIS — F1010 Alcohol abuse, uncomplicated: Secondary | ICD-10-CM

## 2018-06-30 LAB — POCT I-STAT 3, ART BLOOD GAS (G3+)
Acid-Base Excess: 9 mmol/L — ABNORMAL HIGH (ref 0.0–2.0)
Acid-base deficit: 8 mmol/L — ABNORMAL HIGH (ref 0.0–2.0)
Bicarbonate: 21.1 mmol/L (ref 20.0–28.0)
Bicarbonate: 33.8 mmol/L — ABNORMAL HIGH (ref 20.0–28.0)
O2 SAT: 95 %
O2 Saturation: 100 %
PCO2 ART: 48.8 mmHg — AB (ref 32.0–48.0)
PO2 ART: 238 mmHg — AB (ref 83.0–108.0)
Patient temperature: 98.8
TCO2: 23 mmol/L (ref 22–32)
TCO2: 35 mmol/L — AB (ref 22–32)
pCO2 arterial: 59.9 mmHg — ABNORMAL HIGH (ref 32.0–48.0)
pH, Arterial: 7.159 — CL (ref 7.350–7.450)
pH, Arterial: 7.449 (ref 7.350–7.450)
pO2, Arterial: 105 mmHg (ref 83.0–108.0)

## 2018-06-30 LAB — I-STAT CHEM 8, ED
CALCIUM ION: 1.01 mmol/L — AB (ref 1.15–1.40)
CREATININE: 17.5 mg/dL — AB (ref 0.44–1.00)
Chloride: 105 mmol/L (ref 98–111)
GLUCOSE: 75 mg/dL (ref 70–99)
HCT: 33 % — ABNORMAL LOW (ref 36.0–46.0)
Hemoglobin: 11.2 g/dL — ABNORMAL LOW (ref 12.0–15.0)
Potassium: 6.9 mmol/L (ref 3.5–5.1)
Sodium: 135 mmol/L (ref 135–145)
TCO2: 18 mmol/L — AB (ref 22–32)

## 2018-06-30 LAB — CBC WITH DIFFERENTIAL/PLATELET
BASOS ABS: 0 10*3/uL (ref 0.0–0.1)
BASOS PCT: 0 %
EOS ABS: 0.1 10*3/uL (ref 0.0–0.7)
Eosinophils Relative: 2 %
HCT: 35.2 % — ABNORMAL LOW (ref 36.0–46.0)
Hemoglobin: 10.1 g/dL — ABNORMAL LOW (ref 12.0–15.0)
LYMPHS ABS: 0.7 10*3/uL (ref 0.7–4.0)
Lymphocytes Relative: 11 %
MCH: 27.6 pg (ref 26.0–34.0)
MCHC: 28.7 g/dL — AB (ref 30.0–36.0)
MCV: 96.2 fL (ref 78.0–100.0)
MONO ABS: 0.5 10*3/uL (ref 0.1–1.0)
Monocytes Relative: 8 %
NEUTROS ABS: 4.9 10*3/uL (ref 1.7–7.7)
Neutrophils Relative %: 79 %
Platelets: 225 10*3/uL (ref 150–400)
RBC: 3.66 MIL/uL — ABNORMAL LOW (ref 3.87–5.11)
RDW: 22.8 % — AB (ref 11.5–15.5)
WBC: 6.2 10*3/uL (ref 4.0–10.5)

## 2018-06-30 LAB — I-STAT TROPONIN, ED: Troponin i, poc: 0.07 ng/mL (ref 0.00–0.08)

## 2018-06-30 LAB — I-STAT ARTERIAL BLOOD GAS, ED
Acid-base deficit: 8 mmol/L — ABNORMAL HIGH (ref 0.0–2.0)
Bicarbonate: 19.4 mmol/L — ABNORMAL LOW (ref 20.0–28.0)
O2 Saturation: 89 %
PCO2 ART: 45.9 mmHg (ref 32.0–48.0)
PH ART: 7.233 — AB (ref 7.350–7.450)
PO2 ART: 66 mmHg — AB (ref 83.0–108.0)
Patient temperature: 98.2
TCO2: 21 mmol/L — ABNORMAL LOW (ref 22–32)

## 2018-06-30 LAB — APTT: aPTT: 35 seconds (ref 24–36)

## 2018-06-30 LAB — PROTIME-INR
INR: 1.14
Prothrombin Time: 14.5 seconds (ref 11.4–15.2)

## 2018-06-30 LAB — BASIC METABOLIC PANEL
Anion gap: 23 — ABNORMAL HIGH (ref 5–15)
BUN: 141 mg/dL — AB (ref 6–20)
CALCIUM: 9.2 mg/dL (ref 8.9–10.3)
CHLORIDE: 100 mmol/L (ref 98–111)
CO2: 16 mmol/L — ABNORMAL LOW (ref 22–32)
CREATININE: 16.34 mg/dL — AB (ref 0.44–1.00)
GFR calc non Af Amer: 2 mL/min — ABNORMAL LOW (ref >60)
GFR, EST AFRICAN AMERICAN: 3 mL/min — AB (ref >60)
Glucose, Bld: 75 mg/dL (ref 70–99)
Potassium: 7 mmol/L (ref 3.5–5.1)
SODIUM: 139 mmol/L (ref 135–145)

## 2018-06-30 LAB — MRSA PCR SCREENING: MRSA BY PCR: NEGATIVE

## 2018-06-30 LAB — TRIGLYCERIDES: TRIGLYCERIDES: 105 mg/dL (ref <150)

## 2018-06-30 LAB — GLUCOSE, CAPILLARY: Glucose-Capillary: 83 mg/dL (ref 70–99)

## 2018-06-30 MED ORDER — PANTOPRAZOLE SODIUM 40 MG PO PACK
40.0000 mg | PACK | Freq: Every day | ORAL | Status: DC
  Administered 2018-07-01: 40 mg
  Filled 2018-06-30 (×3): qty 20

## 2018-06-30 MED ORDER — CHLORHEXIDINE GLUCONATE CLOTH 2 % EX PADS
6.0000 | MEDICATED_PAD | Freq: Every day | CUTANEOUS | Status: DC
  Administered 2018-06-30 – 2018-07-01 (×2): 6 via TOPICAL

## 2018-06-30 MED ORDER — ROCURONIUM BROMIDE 50 MG/5ML IV SOLN
INTRAVENOUS | Status: DC | PRN
  Administered 2018-06-30: 50 mg via INTRAVENOUS

## 2018-06-30 MED ORDER — SODIUM BICARBONATE 8.4 % IV SOLN
50.0000 meq | Freq: Once | INTRAVENOUS | Status: AC
  Administered 2018-06-30: 50 meq via INTRAVENOUS
  Filled 2018-06-30: qty 50

## 2018-06-30 MED ORDER — LIDOCAINE HCL (PF) 1 % IJ SOLN: 5.0000 mL | INTRAMUSCULAR | Status: DC | PRN

## 2018-06-30 MED ORDER — NITROGLYCERIN 2 % TD OINT
1.0000 [in_us] | TOPICAL_OINTMENT | Freq: Once | TRANSDERMAL | Status: AC
  Administered 2018-06-30: 1 [in_us] via TOPICAL

## 2018-06-30 MED ORDER — PROPOFOL 1000 MG/100ML IV EMUL
INTRAVENOUS | Status: AC
  Administered 2018-06-30: 75 ug/kg/min via INTRAVENOUS
  Filled 2018-06-30: qty 100

## 2018-06-30 MED ORDER — LIDOCAINE-PRILOCAINE 2.5-2.5 % EX CREA
1.0000 "application " | TOPICAL_CREAM | CUTANEOUS | Status: DC | PRN
  Filled 2018-06-30: qty 5

## 2018-06-30 MED ORDER — CALCIUM GLUCONATE 10 % IV SOLN
1.0000 g | Freq: Once | INTRAVENOUS | Status: DC
  Filled 2018-06-30: qty 10

## 2018-06-30 MED ORDER — MORPHINE SULFATE 2 MG/ML IJ SOLN
INTRAMUSCULAR | Status: DC | PRN
  Administered 2018-06-30: 4 mg via INTRAVENOUS

## 2018-06-30 MED ORDER — DEXTROSE 50 % IV SOLN
1.0000 | Freq: Once | INTRAVENOUS | Status: AC
  Administered 2018-06-30: 50 mL via INTRAVENOUS
  Filled 2018-06-30: qty 50

## 2018-06-30 MED ORDER — SODIUM CHLORIDE 0.9 % IV SOLN
250.0000 mL | INTRAVENOUS | Status: DC | PRN
  Administered 2018-06-30 – 2018-07-02 (×2): 250 mL via INTRAVENOUS

## 2018-06-30 MED ORDER — VANCOMYCIN HCL 10 G IV SOLR
1250.0000 mg | Freq: Once | INTRAVENOUS | Status: AC
  Administered 2018-06-30: 1250 mg via INTRAVENOUS
  Filled 2018-06-30: qty 1250

## 2018-06-30 MED ORDER — SODIUM BICARBONATE 8.4 % IV SOLN
50.0000 meq | INTRAVENOUS | Status: AC
  Administered 2018-06-30: 50 meq via INTRAVENOUS

## 2018-06-30 MED ORDER — HEPARIN SODIUM (PORCINE) 1000 UNIT/ML DIALYSIS: 1000.0000 [IU] | INTRAMUSCULAR | Status: DC | PRN

## 2018-06-30 MED ORDER — INSULIN ASPART 100 UNIT/ML IV SOLN
5.0000 [IU] | Freq: Once | INTRAVENOUS | Status: AC
  Administered 2018-06-30: 5 [IU] via INTRAVENOUS
  Filled 2018-06-30: qty 0.05

## 2018-06-30 MED ORDER — ETOMIDATE 2 MG/ML IV SOLN
INTRAVENOUS | Status: DC | PRN
  Administered 2018-06-30: 20 mg via INTRAVENOUS

## 2018-06-30 MED ORDER — MORPHINE SULFATE (PF) 4 MG/ML IV SOLN
INTRAVENOUS | Status: AC
  Filled 2018-06-30: qty 1

## 2018-06-30 MED ORDER — SODIUM CHLORIDE 0.9 % IV SOLN: 100.0000 mL | INTRAVENOUS | Status: DC | PRN

## 2018-06-30 MED ORDER — SODIUM CHLORIDE 0.9 % IV SOLN
1.0000 g | INTRAVENOUS | Status: DC
  Administered 2018-06-30 – 2018-07-03 (×4): 1 g via INTRAVENOUS
  Filled 2018-06-30 (×7): qty 10

## 2018-06-30 MED ORDER — PROPOFOL 1000 MG/100ML IV EMUL
5.0000 ug/kg/min | INTRAVENOUS | Status: DC
  Administered 2018-06-30 (×2): 60 ug/kg/min via INTRAVENOUS
  Administered 2018-06-30: 75 ug/kg/min via INTRAVENOUS
  Administered 2018-06-30: 50 ug/kg/min via INTRAVENOUS
  Administered 2018-07-01 (×2): 80 ug/kg/min via INTRAVENOUS
  Filled 2018-06-30 (×5): qty 100

## 2018-06-30 MED ORDER — SODIUM BICARBONATE 8.4 % IV SOLN
INTRAVENOUS | Status: AC
  Filled 2018-06-30: qty 100

## 2018-06-30 MED ORDER — CALCITRIOL 0.5 MCG PO CAPS
0.7500 ug | ORAL_CAPSULE | ORAL | Status: DC
  Administered 2018-07-02: 0.75 ug via ORAL
  Filled 2018-06-30 (×2): qty 1

## 2018-06-30 MED ORDER — SODIUM BICARBONATE 8.4 % IV SOLN
INTRAVENOUS | Status: DC | PRN
  Administered 2018-06-30: 50 meq via INTRAVENOUS

## 2018-06-30 MED ORDER — HEPARIN SODIUM (PORCINE) 1000 UNIT/ML DIALYSIS
40.0000 [IU]/kg | INTRAMUSCULAR | Status: DC | PRN
  Administered 2018-06-30: 2100 [IU] via INTRAVENOUS_CENTRAL

## 2018-06-30 MED ORDER — SODIUM CHLORIDE 0.9 % IV SOLN
1.0000 g | Freq: Once | INTRAVENOUS | Status: AC
  Administered 2018-06-30: 1 g via INTRAVENOUS
  Filled 2018-06-30: qty 10

## 2018-06-30 MED ORDER — SODIUM CHLORIDE 0.9 % IV SOLN
500.0000 mg | INTRAVENOUS | Status: DC
  Administered 2018-06-30 – 2018-07-03 (×4): 500 mg via INTRAVENOUS
  Filled 2018-06-30 (×7): qty 500

## 2018-06-30 MED ORDER — PENTAFLUOROPROP-TETRAFLUOROETH EX AERO: 1.0000 "application " | INHALATION_SPRAY | CUTANEOUS | Status: DC | PRN

## 2018-06-30 MED ORDER — CLEVIDIPINE BUTYRATE 0.5 MG/ML IV EMUL
1.0000 mg/h | INTRAVENOUS | Status: DC
  Filled 2018-06-30: qty 50

## 2018-06-30 MED ORDER — HEPARIN SODIUM (PORCINE) 5000 UNIT/ML IJ SOLN: 5000.0000 [IU] | Freq: Three times a day (TID) | INTRAMUSCULAR | Status: DC

## 2018-06-30 MED ORDER — VANCOMYCIN HCL 500 MG IV SOLR: 500.0000 mg | INTRAVENOUS | Status: DC

## 2018-06-30 MED ORDER — SODIUM CHLORIDE 0.9 % IV SOLN
1.0000 g | Freq: Once | INTRAVENOUS | Status: DC
  Filled 2018-06-30: qty 10

## 2018-06-30 MED ORDER — SODIUM POLYSTYRENE SULFONATE 15 GM/60ML PO SUSP
30.0000 g | Freq: Once | ORAL | Status: AC
  Administered 2018-06-30: 30 g via ORAL
  Filled 2018-06-30: qty 120

## 2018-06-30 MED ORDER — SODIUM CHLORIDE 0.9 % IV SOLN
1.0000 g | Freq: Once | INTRAVENOUS | Status: AC
  Administered 2018-06-30: 1 g via INTRAVENOUS
  Filled 2018-06-30: qty 1

## 2018-06-30 MED ORDER — DEXTROSE 50 % IV SOLN
INTRAVENOUS | Status: AC
  Filled 2018-06-30: qty 50

## 2018-06-30 MED ORDER — MORPHINE SULFATE (PF) 4 MG/ML IV SOLN
4.0000 mg | Freq: Once | INTRAVENOUS | Status: AC
  Administered 2018-06-30: 4 mg via INTRAVENOUS
  Filled 2018-06-30: qty 1

## 2018-06-30 NOTE — Procedures (Signed)
Endotracheal intubation Indication: Respiratory extremis with hemoptysis  Procedure: After explaining the indication to the patient and obtaining her nodded consent the patient was intubated for respiratory extremis.  Prior to intubation she was given an additional amp of sodium bicarbonate out of respect for her acidosis and hypercarbia.  Her airway was inspected and found to be class I .  She was sedated with a total of 4 mg of morphine given for hypertension and concurrent CHF followed by 20 mg of etomidate and then 50 mg of rocuronium.  The vocal cords were visualized using a 3 Miller blade.  There is a substantial amount of bright red blood in the upper airway.  A 7.5 endotracheal tube was easily passed to 22 cm at the lip.  Air movement was symmetrical and CO2 was positive.  Chest x-ray shows a well-placed endotracheal tube.

## 2018-06-30 NOTE — H&P (Signed)
PULMONARY / CRITICAL CARE MEDICINE   Name: [name] MRN: [id number] DOB: [birth year]    ADMISSION DATE:  06/30/2018   CHIEF COMPLAINT: Hemoptysis and extreme dyspnea.  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  She presented to the emergency room with hemoptysis and extreme dyspnea.  Prior to the hemoptysis she was not having fevers chills sweats or purulent cough.  When I first encountered her in the emergency room she was in extremis sitting in the tripod position with a saturation in the 70s, respiratory rate in the 63K systolic blood pressure in excess of 200.  She was intermittently coughing up bright red blood.  When queried it was clear to her that she had not been having nosebleeds and she was not merely coughing up blood that she had had drain into the posterior airway.  It was equally clear that initially she had not been vomiting blood.  Unfortunately further history could not be obtained as the patient required emergent intubation.  She had missed at least 2 dialysis sessions this week.  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  PAST SURGICAL HISTORY: She  has a past surgical history that includes Bascilic vein transposition (Left, 02/05/2014); venogram (Right, 01/31/2014); AV fistula placement; and Fistula superficialization (Left, 05/30/2018).  Allergies  Allergen Reactions  . Tobramycin Sulfate Swelling    No current facility-administered medications on file prior to encounter.    Current Outpatient  Medications on File Prior to Encounter  Medication Sig  . albuterol (PROVENTIL HFA;VENTOLIN HFA) 108 (90 Base) MCG/ACT inhaler Inhale 1-2 puffs into the lungs every 6 (six) hours as needed for wheezing or shortness of breath.  . calcium acetate (PHOSLO) 667 MG capsule Take 2,001-4,002 mg by mouth See admin instructions. 4,002 mg three times a day with each meal and 2,001 mg with each snack  . carvedilol (COREG) 3.125 MG tablet Take 1 tablet (3.125 mg total) by mouth 2 (two) times daily with a meal.  . darbepoetin (ARANESP) 60 MCG/0.3ML SOLN injection Inject 0.3 mLs (60 mcg total) into the vein every Wednesday with hemodialysis. (Patient taking differently: Inject 60 mcg into the vein every 7 (seven) days. )  . diphenhydrAMINE (BENADRYL) 50 MG tablet Take 50 mg by mouth every 6 (six) hours as needed for itching.  . ferric gluconate 62.5 mg in sodium chloride 0.9 % 100 mL Inject 62.5 mg into the vein every Thursday with hemodialysis. (Patient taking differently: Inject 62.5 mg into the vein every Saturday with hemodialysis. )  . hydrALAZINE (APRESOLINE) 25 MG tablet Take 1 tablet (25 mg total) by mouth 3 (three) times daily. (Patient not taking: Reported on 03/23/2018)  . hydroxychloroquine (PLAQUENIL) 200 MG tablet Take 2 tablets (400 mg total) by mouth daily. (Patient not taking: Reported on 03/23/2018)  . isosorbide mononitrate (IMDUR) 30 MG 24 hr tablet Take 1 tablet (30 mg total) by mouth daily. (Patient not taking: Reported on 03/23/2018)  . multivitamin (RENA-VIT) TABS tablet Take 1 tablet by mouth daily.  . Nutritional Supplements (FEEDING SUPPLEMENT, NEPRO CARB STEADY,) LIQD Take 237 mLs by mouth See admin  instructions. At every dialysis treatment on Tues/Thurs/Sat  . oxyCODONE (ROXICODONE) 5 MG immediate release tablet Take 1 tablet (5 mg total) by mouth every 4 (four) hours as needed for severe pain. (Patient not taking: Reported on 06/21/2018)  . triamcinolone cream (KENALOG) 0.1 % Apply 1  application topically 2 (two) times daily. (Patient not taking: Reported on 02/16/2017)    FAMILY HISTORY:  Her indicated that her mother is alive. She indicated that her father is alive.   SOCIAL HISTORY: She  reports that she has been smoking cigarettes.  She has a 1.80 pack-year smoking history. She has never used smokeless tobacco. She reports that she drinks alcohol. She reports that she has current or past drug history. Drugs: Marijuana and Cocaine. Frequency: 5.00 times per week.  REVIEW OF SYSTEMS:   Unobtainable  SUBJECTIVE:  As above  VITAL SIGNS: BP (!) 191/133   Pulse (!) 120   Temp 99.1 F (37.3 C) (Axillary)   Resp (!) 22   Ht '5\' 2"'$  (1.540 m)   Wt 116 lb 2.9 oz (52.7 kg)   SpO2 100%   BMI 21.25 kg/m   HEMODYNAMICS:    VENTILATOR SETTINGS: Vent Mode: PRVC FiO2 (%):  [60 %-100 %] 100 % Set Rate:  [22 bmp-26 bmp] 26 bmp Vt Set:  [400 mL] 400 mL PEEP:  [7 cmH20] 7 cmH20 Plateau Pressure:  [28 cmH20] 28 cmH20  INTAKE / OUTPUT: No intake/output data recorded.  PHYSICAL EXAMINATION: General: Thin chronically ill-appearing female in obvious extremitas when first met by me. Neuro: Prior to intubation she was nodding appropriately to questions and moving all fours.  Pupils were equal Cardiovascular: S1 and S2 are currently regular without murmur rub or gallop.  Very pronounced JVD Lungs: Following intubation respirations are unlabored there is symmetric air movement lots of scattered rhonchi and no wheezes Abdomen: The abdomen is flat and soft without any organomegaly masses or tenderness Musculoskeletal: Is a fistula with a thrill in the left arm Skin: No dependent edema  LABS:  BMET Recent Labs  Lab 06/30/18 0808 06/30/18 0813  NA 139 135  K 7.0* 6.9*  CL 100 105  CO2 16*  --   BUN 141* >140*  CREATININE 16.34* 17.50*  GLUCOSE 75 75    Electrolytes Recent Labs  Lab 06/30/18 0808  CALCIUM 9.2    CBC Recent Labs  Lab 06/30/18 0808  06/30/18 0813  WBC 6.2  --   HGB 10.1* 11.2*  HCT 35.2* 33.0*  PLT 225  --     Coag's Recent Labs  Lab 06/30/18 1011  APTT 35  INR 1.14    Sepsis Markers No results for input(s): LATICACIDVEN, PROCALCITON, O2SATVEN in the last 168 hours.  ABG Recent Labs  Lab 06/30/18 1139 06/30/18 1327  PHART 7.233* 7.159*  PCO2ART 45.9 59.9*  PO2ART 66.0* 105.0    Liver Enzymes No results for input(s): AST, ALT, ALKPHOS, BILITOT, ALBUMIN in the last 168 hours.  Cardiac Enzymes No results for input(s): TROPONINI, PROBNP in the last 168 hours.  Glucose Recent Labs  Lab 06/30/18 1319 06/30/18 1324  GLUCAP <10* 83    Imaging Dg Chest Portable 1 View  Result Date: 06/30/2018 CLINICAL DATA:  Status post intubation EXAM: PORTABLE CHEST 1 VIEW COMPARISON:  06/30/2018 FINDINGS: Endotracheal tube and nasogastric catheter are now seen. The tip of the nasogastric catheter is not visualized although is felt to lie within the stomach. Diffuse bilateral infiltrative changes are again seen and slightly increased when compared  with the prior exam particularly in the left upper lobe. This likely represents some superimposed pulmonary edema. IMPRESSION: Placement of endotracheal tube and nasogastric catheter in satisfactory position. Increasing density within the lungs bilaterally. Electronically Signed   By: Inez Catalina M.D.   On: 06/30/2018 13:13   Dg Chest Portable 1 View  Result Date: 06/30/2018 CLINICAL DATA:  Shortness of breath EXAM: PORTABLE CHEST 1 VIEW COMPARISON:  06/21/2018 FINDINGS: Cardiac shadow remains enlarged. Previously seen patchy infiltrates have increased significantly now occupying approximately 2/3 of each lung. Small bilateral pleural effusions are noted. No pneumothorax is seen. No bony abnormality is noted. IMPRESSION: Significant increase in bilateral pulmonary infiltrates. Electronically Signed   By: Inez Catalina M.D.   On: 06/30/2018 08:40     STUDIES:  Chest x-ray  following intubation to my eye shows patchy bilateral infiltrates.  CULTURES: Sputum and blood were ordered on 7/4  ANTIBIOTICS: She received vancomycin and cefepime in the department of emergency medicine I have ordered scheduled doses of azithromycin and Rocephin.    SIGNIFICANT EVENTS:   LINES/TUBES: Left femoral arterial line placed 7/4  DISCUSSION:       This is a 37 year old who is chronically immunosuppressed with Plaquenil for history of lupus with lupus nephritis.  ASSESSMENT / PLAN:  PULMONARY A: Respiratory extremis.  Certainly the most likely provocations for hemoptysis or CHF and infection.  CHF will be addressed with dialysis, I have treated her with nitrates and morphine and will be controlling her blood pressure with Cleviprex until definitive treatment with dialysis can be obtained.  Her infiltrates are not entirely symmetric however I suspect there is been blood aspiration likely confusing the radiologic picture.  Cultures have been ordered and antibiotics put in place.  She does have potential for an atypical that she is chronically immunosuppressed with Plaquenil, however this can be addressed if she fails to respond to initial therapy.  CARDIOVASCULAR A: She has extreme hypertension and gross JVD and as noted I suspect there is a significant component of congestive failure contributing to her respiratory extremis.  I have chosen titratable antihypertensives including bolus doses of morphine and topical nitro glycerin as I anticipate that her hypertension will resolve quickly with dialysis.  A PRN Cleviprex order has also been placed.  RENAL A: End-stage renal disease on hemodialysis as noted  GASTROINTESTINAL A: Prophylaxis will be with Protonix   HEMATOLOGIC A: Prophylaxis will be with SCDs alone for now as she had very impressive hemoptysis  INFECTIOUS A: As noted above  ENDOCRINE A: No active issues  NEUROLOGIC A: No active issues.   Other than 32  minutes was spent in the care of this patient today with life-threatening respiratory failure.  This time does not include time performing procedures.   Lars Masson, MD  [place] Pager: [phone]  06/30/2018, 1:39 PM

## 2018-06-30 NOTE — ED Notes (Signed)
Called Dialysis to inquire when patient will be taken to dialysis. Staff reports she is next, but it will be at least an hour.

## 2018-06-30 NOTE — Procedures (Signed)
Arterial line placement  Indication: The arterial line was placed for titration of antihypertensive agents in a setting where blood pressure is likely to be extremely labile as we initiate hemodialysis.  The line was placed as medically indicated as we currently do not have a surrogate for the patient  Procedure: The left groin was extensively prepped with chlorhexidine and draped.  Under sterile conditions the artery was palpated and easily cannulated.  A wire was gently passed and a 20 ga arterial line catheter placed.  Catheter was sutured in place.  There was good tracing and flow.  Sterile dressing was applied.  There was no immediate complication.

## 2018-06-30 NOTE — Progress Notes (Signed)
Patient transported from ED to room 2M04 without complications.   

## 2018-06-30 NOTE — ED Notes (Signed)
Admitting paged to RN regarding bed assignment

## 2018-06-30 NOTE — ED Notes (Signed)
Patient placed on bipap via RT.

## 2018-06-30 NOTE — Progress Notes (Signed)
[facility] ABG values called to Dr Pearline Cables.  Per MD order, set rate increased to 26.  No other RT orders received currently.

## 2018-06-30 NOTE — ED Provider Notes (Addendum)
[place] EMERGENCY DEPARTMENT Provider Note   CSN: [id number] Arrival date & time: 06/30/18  0740     History   Chief Complaint No chief complaint on file.   HPI [name] is a 37 y.o. female.  Patient with history of end-stage renal disease, SVT, presents with acute onset of shortness of breath, cough and hemoptysis starting about last night.  She dialyzes on Tuesday, Thursday, and Saturday.  She did not go to dialysis on Tuesday.  She dialyzes at [facility] and is cared for by Dr. Joelyn Oms.  She went to dialysis this morning and they sent her to the emergency department and did not receive dialysis.  She has generalized chest pains.  She denies any fever.  No nausea, vomiting, abdominal pain, or diarrhea.  Patient has been on dialysis for approximately 5 years.  She does not have any lower extremity edema or rash.  No treatments prior to arrival. The course is constant. Aggravating factors: none. Alleviating factors: none.       Past Medical History:  Diagnosis Date  . Anemia    low iron - receives iron at dialysis  . Chronic systolic congestive heart failure ([place]) 03/16/2016  . Dyspnea   . ESRD (end stage renal disease) ([place])    Hemo TTHSAT _ [place]  . H/O pericarditis 01/17/2013  . H/O pleural effusion 01/17/2013  . Heart murmur   . Lupus (systemic lupus erythematosus) (HCC)    Previously followed with Dr. Charlestine Night, has not followed up recently  . 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 Pneumonia    many times  . Polysubstance abuse ([place])    cocaine, MJ, tobacco  . S/P pericardiocentesis 01/17/2013   H/o pericardial effusion with tamponade 2006   . Seizures ([place])    during pregnancy 1 time    Patient Active Problem List   Diagnosis Date Noted  . ESRD (end stage renal disease) on  dialysis ([place]) 02/17/2017  . SOB (shortness of breath) 02/16/2017  . Chronic systolic congestive heart failure ([place]) 03/16/2016  . Cough   . Ascites   . Follow up   . Other hypervolemia   . S/P thoracentesis   . Abdominal pain   . Pleural effusion   . Hemoptysis   . Myalgia   . Pulmonary edema   . Crack cocaine overdose ([place])   . ESRD (end stage renal disease) ([place]) 03/03/2016  . Hypertensive emergency 03/03/2016  . Acute bronchitis 02/03/2016  . Volume overload 11/16/2015  . Shortness of breath 11/16/2015  . Acute UTI   . Hyperkalemia 09/25/2015  . Rash and nonspecific skin eruption 06/26/2015  . Secondary Raynaud's phenomenon 06/24/2015  . Cramping of hands 04/19/2014  . Unspecified contraceptive management 04/19/2014  . End stage renal disease ([place]) 03/27/2014  . Insomnia 03/15/2014  . Benign hypertension with ESRD (end-stage renal disease) ([place]) 03/15/2014  . Tobacco abuse 02/15/2014  . Healthcare maintenance 02/15/2014  . Hypoalbuminemia 02/01/2014  . Nephrotic syndrome 02/01/2014  . ESRD on dialysis ([place]) 01/31/2014  . Pleural effusion, left 01/31/2014  . Metabolic acidosis, increased anion gap (IAG) 01/31/2014  . Microcytic anemia 01/29/2014  . Hypocalcemia 01/29/2014  . Streptococcal bacteremia 01/23/2013  . Cocaine abuse ([place]) 01/18/2013  . Marijuana smoker ([place]) 01/18/2013  . H/O pericarditis 01/17/2013  . SLE (systemic lupus erythematosus) ([place]) 01/17/2013  . Lupus  nephritis ([place]) 01/17/2013  . S/P pericardiocentesis 01/17/2013  . H/O pleural effusion 01/17/2013  . Nephrosis 01/17/2013  . Trichomoniasis 01/17/2013  . Preseptal cellulitis 01/17/2013    Past Surgical History:  Procedure Laterality Date  . AV FISTULA PLACEMENT    . BASCILIC VEIN TRANSPOSITION Left 02/05/2014   Procedure: [place];  Surgeon: [name], MD;  Location: [place];  Service: Vascular;  Laterality: Left;  . FISTULA SUPERFICIALIZATION Left 05/30/2018   Procedure:  FISTULA PLICATION BASILIC VEIN TRANSPOSITION;  Surgeon: Angelia Mould, MD;  Location: [facility];  Service: Vascular;  Laterality: Left;  Marland Kitchen VENOGRAM Right 01/31/2014   Procedure: DIALYSIS CATHETER;  Surgeon: Serafina Mitchell, MD;  Location: [facility] CATH LAB;  Service: Cardiovascular;  Laterality: Right;     OB History   None      Home Medications    Prior to Admission medications   Medication Sig Start Date End Date Taking? Authorizing Provider  albuterol (PROVENTIL HFA;VENTOLIN HFA) 108 (90 Base) MCG/ACT inhaler Inhale 1-2 puffs into the lungs every 6 (six) hours as needed for wheezing or shortness of breath. 02/03/16   Lucious Groves, DO  calcium acetate (PHOSLO) 667 MG capsule Take 2,001-4,002 mg by mouth See admin instructions. 4,002 mg three times a day with each meal and 2,001 mg with each snack    [provider]  carvedilol (COREG) 3.125 MG tablet Take 1 tablet (3.125 mg total) by mouth 2 (two) times daily with a meal. 05/21/18   Cardama, Grayce Sessions, MD  darbepoetin (ARANESP) 60 MCG/0.3ML SOLN injection Inject 0.3 mLs (60 mcg total) into the vein every Wednesday with hemodialysis. Patient taking differently: Inject 60 mcg into the vein every 7 (seven) days.  02/08/14   Jessee Avers, MD  diphenhydrAMINE (BENADRYL) 50 MG tablet Take 50 mg by mouth every 6 (six) hours as needed for itching.    [provider]  ferric gluconate 62.5 mg in sodium chloride 0.9 % 100 mL Inject 62.5 mg into the vein every Thursday with hemodialysis. Patient taking differently: Inject 62.5 mg into the vein every Saturday with hemodialysis.  03/09/16   Rice, Resa Miner, MD  hydrALAZINE (APRESOLINE) 25 MG tablet Take 1 tablet (25 mg total) by mouth 3 (three) times daily. Patient not taking: Reported on 03/23/2018 02/18/17   Holley Raring, MD  hydroxychloroquine (PLAQUENIL) 200 MG tablet Take 2 tablets (400 mg total) by mouth daily. Patient not taking: Reported on 03/23/2018 02/18/17   Holley Raring, MD  isosorbide mononitrate (IMDUR) 30 MG 24 hr tablet Take 1 tablet (30 mg total) by mouth daily. Patient not taking: Reported on 03/23/2018 02/18/17   Holley Raring, MD  multivitamin (RENA-VIT) TABS tablet Take 1 tablet by mouth daily. 02/18/17   Holley Raring, MD  Nutritional Supplements (FEEDING SUPPLEMENT, NEPRO CARB STEADY,) LIQD Take 237 mLs by mouth See admin instructions. At every dialysis treatment on Tues/Thurs/Sat    [provider]  oxyCODONE (ROXICODONE) 5 MG immediate release tablet Take 1 tablet (5 mg total) by mouth every 4 (four) hours as needed for severe pain. Patient not taking: Reported on 06/21/2018 05/30/18   Angelia Mould, MD  triamcinolone cream (KENALOG) 0.1 % Apply 1 application topically 2 (two) times daily. Patient not taking: Reported on 02/16/2017 06/24/15   Lucious Groves, DO    Family History No family history on file.  Social History Social History   Tobacco Use  . Smoking status: Current Every Day Smoker  Packs/day: 0.12    Years: 15.00    Pack years: 1.80    Types: Cigarettes  . Smokeless tobacco: Never Used  . Tobacco comment: quit smoking s/p hospital discharge/ SMOKES ABOUT 2-3 CIGARETTES A DAY  Substance Use Topics  . Alcohol use: Yes    Alcohol/week: 0.0 oz    Comment: Special Occasional  . Drug use: Yes    Frequency: 5.0 times per week    Types: Marijuana, Cocaine    Comment: Marijuana last time mid May 2019, Cocaine- last tile mid April 2019     Allergies   Tobramycin sulfate   Review of Systems Review of Systems  Constitutional: Negative for fever.  HENT: Negative for rhinorrhea and sore throat.   Eyes: Negative for redness.  Respiratory: Positive for cough and shortness of breath.   Cardiovascular: Positive for chest pain.  Gastrointestinal: Negative for abdominal pain, diarrhea, nausea and vomiting.  Genitourinary: Negative for dysuria.  Musculoskeletal: Negative for myalgias.  Skin: Negative for  rash.  Neurological: Negative for headaches.     Physical Exam Updated Vital Signs BP (!) 168/120 (BP Location: Right Arm)   Pulse (!) 109   Temp 98.2 F (36.8 C) (Oral)   Resp (!) 32   SpO2 100%    Physical Exam  Constitutional: She appears well-developed and well-nourished.  HENT:  Head: Normocephalic and atraumatic.  Mouth/Throat: Oropharynx is clear and moist.  Eyes: Conjunctivae are normal. Right eye exhibits no discharge. Left eye exhibits no discharge.  Neck: Normal range of motion. Neck supple.  Cardiovascular: Regular rhythm and normal heart sounds. Tachycardia present.  Pulmonary/Chest: Tachypnea noted. She is in respiratory distress. She has rales.  Abdominal: Soft. There is no tenderness. There is no rebound and no guarding.  Musculoskeletal: She exhibits no edema or tenderness.  No LE edema.   Neurological: She is alert.  Skin: Skin is warm and dry.  Psychiatric: She has a normal mood and affect.  Nursing note and vitals reviewed.    ED Treatments / Results  Labs (all labs ordered are listed, but only abnormal results are displayed) Labs Reviewed  CBC WITH DIFFERENTIAL/PLATELET - Abnormal; Notable for the following components:      Result Value   RBC 3.66 (*)    Hemoglobin 10.1 (*)    HCT 35.2 (*)    MCHC 28.7 (*)    RDW 22.8 (*)    All other components within normal limits  BASIC METABOLIC PANEL - Abnormal; Notable for the following components:   Potassium 7.0 (*)    CO2 16 (*)    BUN 141 (*)    Creatinine, Ser 16.34 (*)    GFR calc non Af Amer 2 (*)    GFR calc Af Amer 3 (*)    Anion gap 23 (*)    All other components within normal limits  I-STAT CHEM 8, ED - Abnormal; Notable for the following components:   Potassium 6.9 (*)    BUN >140 (*)    Creatinine, Ser 17.50 (*)    Calcium, Ion 1.01 (*)    TCO2 18 (*)    Hemoglobin 11.2 (*)    HCT 33.0 (*)    All other components within normal limits  I-STAT TROPONIN, ED    EKG EKG  Interpretation  Date/Time:  Thursday June 30 2018 07:52:37 EDT Ventricular Rate:  97 PR Interval:    QRS Duration: 97 QT Interval:  394 QTC Calculation: 501 R Axis:   85 Text Interpretation:  Sinus rhythm Prolonged PR interval Left atrial enlargement Borderline low voltage, extremity leads Left ventricular hypertrophy ST elev, probable normal early repol pattern Prolonged QT interval hyperacute T waves Confirmed by Malvin Johns [phone]) on 06/30/2018 8:06:23 AM   Radiology Dg Chest Portable 1 View  Result Date: 06/30/2018 CLINICAL DATA:  Shortness of breath EXAM: PORTABLE CHEST 1 VIEW COMPARISON:  06/21/2018 FINDINGS: Cardiac shadow remains enlarged. Previously seen patchy infiltrates have increased significantly now occupying approximately 2/3 of each lung. Small bilateral pleural effusions are noted. No pneumothorax is seen. No bony abnormality is noted. IMPRESSION: Significant increase in bilateral pulmonary infiltrates. Electronically Signed   By: Inez Catalina M.D.   On: 06/30/2018 08:40    Procedures Procedures (including critical care time)  Medications Ordered in ED Medications  Chlorhexidine Gluconate Cloth 2 % PADS 6 each (has no administration in time range)  calcitRIOL (ROCALTROL) capsule 0.75 mcg (has no administration in time range)  vancomycin (VANCOCIN) 500 mg in sodium chloride 0.9 % 100 mL IVPB (has no administration in time range)  calcium chloride 1 g in sodium chloride 0.9 % 100 mL IVPB (has no administration in time range)  calcium chloride 1 g in sodium chloride 0.9 % 100 mL IVPB (0 g Intravenous Stopped 06/30/18 0911)  insulin aspart (novoLOG) injection 5 Units (5 Units Intravenous Given 06/30/18 0845)  dextrose 50 % solution 50 mL (50 mLs Intravenous Given 06/30/18 0845)  sodium polystyrene (KAYEXALATE) 15 GM/60ML suspension 30 g (30 g Oral Given 06/30/18 0853)  ceFEPIme (MAXIPIME) 1 g in sodium chloride 0.9 % 100 mL IVPB (0 g Intravenous Stopped 06/30/18 1004)  vancomycin  (VANCOCIN) 1,250 mg in sodium chloride 0.9 % 250 mL IVPB (1,250 mg Intravenous New Bag/Given 06/30/18 1006)  sodium bicarbonate injection 50 mEq (50 mEq Intravenous Given 06/30/18 1051)     Initial Impression / Assessment and Plan / ED Course  I have reviewed the triage vital signs and the nursing notes.  Pertinent labs & imaging results that were available during my care of the patient were reviewed by me and considered in my medical decision making (see chart for details).     Patient seen and examined. Work-up initiated. Medications ordered.   Vital signs reviewed and are as follows: BP (!) 168/120   Pulse 100   Temp 98.2 F (36.8 C) (Oral)   Resp (!) 42   SpO2 100%   8:30 AM CXR reviewed. Pt d/w and seen by Dr. Tamera Punt.   Hyperkalemia with peaked t-waves: calcium, insulin/D50, kayexalate. Will consult nephrology when CXR results.   Work of breathing: trial bipap.   8:52 AM Cover for PNA with vancomycin and cefepime. Pt rechecked, exam unchanged.   Will admit to hospital.    9:48 AM Spoke with IMTS earlier who will see patient.  Patient reassessed.  She appears comfortable on BiPAP.  CRITICAL CARE Performed by: Faustino Congress Total critical care time: 40 minutes Critical care time was exclusive of separately billable procedures and treating other patients. Critical care was necessary to treat or prevent imminent or life-threatening deterioration. Critical care was time spent personally by me on the following activities: development of treatment plan with patient and/or surrogate as well as nursing, discussions with consultants, evaluation of patient's response to treatment, examination of patient, obtaining history from patient or surrogate, ordering and performing treatments and interventions, ordering and review of laboratory studies, ordering and review of radiographic studies, pulse oximetry and re-evaluation of patient's condition.  BP (!) 205/133  Pulse (!) 105   Temp  98.2 F (36.8 C) (Oral)   Resp (!) 44   Ht 5\' 5"  (1.651 m)   Wt 52.7 kg (116 lb 2.9 oz)   SpO2 100%   BMI 19.33 kg/m   11:55 AM Patient had worsening production of blood with cough. BiPAP was discontinued. I spoke with ICU who will see patient and likely admit. IMTS aware. Will not admit to stepdown at this time.   ABG pH=7.23.   12:18 PM Dr. Pearline Cables in ED. Plan intubation, admit to ICU.   Final Clinical Impressions(s) / ED Diagnoses   Final diagnoses:  Hyperkalemia  Respiratory distress  Cough with hemoptysis   Admit to ICU for above.     ED Discharge Orders    None          Malvin Johns, MD 06/30/18 1032     Carlisle Cater, PA-C 06/30/18 1218    Malvin Johns, MD 06/30/18 1241

## 2018-06-30 NOTE — Progress Notes (Signed)
Upon arrival to patient room patient noted to be in respiratory distress on non-rebreather mask.  Bipap was ordered for patient and placed patient on bipap per MD order.  Patient is currently tolerating well.  Will continue to monitor.

## 2018-06-30 NOTE — ED Triage Notes (Signed)
Patient arrived to ED via [facility] from dialysis. EMS reports:  Patient at dialysis. Dialysis not started this morning d/t shortness of breath and patient coughing up blood. Patient reports she began coughing up blood and experiencing shortness of breath approx midnight.  Patient missed dialysis Tuesday. Last dialysis on Saturday.  Lungs clear.  BP 170/124, Pulse 102, Resp 26, Temp 97.9.

## 2018-06-30 NOTE — H&P (Addendum)
Date: 06/30/2018               Patient Name:  [name] MRN: [id number]  DOB: 18-Jun-1981 Age / Sex: 37 y.o., female   PCP: Patient, No Pcp Per         Medical Service: Internal Medicine Teaching Service         Attending Physician: Dr. Lynnae January, Real Cons, MD    First Contact: Dr. Nathanial Rancher, MD Pager: [phone]  Second Contact: Dr. Estill Dooms, MD Pager: [phone]       After Hours (After 5p/  First Contact Pager: [phone]  weekends / holidays): Second Contact Pager: [phone]   Chief Complaint: Shortness of breath, Hemoptysis   History of Present Illness:  [name] is a 37 year old African American female with PMHx significant for Systemic Lupus Erythematosus, ESRD on TTS schedule, HTN, Polysubstance Abuse, SVT, New onset Atrial Fibrillation and Chronic Long QT discovered on 06/21/2018.   Patient was in her usual state of health until this morning when she presented to her dialysis center ([facility]) with significant respiratory distress and hemoptysis. Patient missed her Tuesday HD session and began experiencing SOB this morning. At the [place], EMS was called and she reported to the emergency department. Patient was examined at bedside in respiratory distress who limited obtaining history but however noted that she started coughing blood yesterday. She denied fevers, abdominal pain, lower extremity swelling, headaches, nausea, vomiting. She did however endorse having chills yesterday and vague chest pain.  In the ED, she was tachycardic (103), tachypneic (44), hypertensive (171/128). CXR revealed significant bilateral pulmonary edema with patchy infiltrates. She was hyperkalemic at 7 and EKG revealed some T changes. She urgently received Kayexalate, Calcium Gluconate, Calcitiol, Vancomycin, Cefepime and was subsequently placed on BiPAP   Nephrologist (Dr. Posey Pronto) was consulted and arranged for urgent hemodialysis for volume unloading.   **Full history was not able  to be obtained secondary to respiratory distress** **Secondary information was obtained from chart review** **Discussion was undertaken with ED provider**  Meds: Unable to obtain No outpatient medications have been marked as taking for the 06/30/18 encounter [name] Encounter).     Allergies: Allergies as of 06/30/2018 - Review Complete 06/30/2018  Allergen Reaction Noted  . Tobramycin sulfate Swelling 07/05/2012   Past Medical History:  Diagnosis Date  . Anemia    low iron - receives iron at dialysis  . Chronic systolic congestive heart failure ([place]) 03/16/2016  . Dyspnea   . ESRD (end stage renal disease) ([place])    Hemo TTHSAT _ [place]  . H/O pericarditis 01/17/2013  . H/O pleural effusion 01/17/2013  . Heart murmur   . Lupus (systemic lupus erythematosus) (HCC)    Previously followed with Dr. Charlestine Night, has not followed up recently  . 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 Pneumonia    many times  . Polysubstance abuse ([place])    cocaine, MJ, tobacco  . S/P pericardiocentesis 01/17/2013   H/o pericardial effusion with tamponade 2006   . Seizures ([name])    during pregnancy 1 time    Family History:  Unable to obtain  Social History:  - Cocaine use  - Marijuana with last use yesterday - Current tobacco smoker - ETCH use  Review of Systems: A complete ROS was negative except as per HPI.   Physical Exam:  Blood pressure (!) 205/133, pulse (!) 103, temperature 98.2 F (36.8 C), temperature source Oral, resp. rate (!) 25, height 5\' 5"  (1.651 m), weight 116 lb 2.9 oz (52.7 kg), SpO2 100 %.  Constitutional: In acute distress, difficulty articulating words, BiPAP in place HEENT: Atraumatic, normocephalic, teeth stained with blood Eyes: Non-icteric  Cardiovascular: RRR, no murmurs, gallops, rubs Respiratory: Diffused  crackles appreciated in all lung field but more significant in posterior lower lobes Abdomen: Soft, non-distended, non tender Extremities: No lower extremity edema  Neuro: Alert and oriented  Skin: NO rashes    EKG: personally reviewed my interpretation is T wave abnormalities   CXR: personally reviewed my interpretation is pulmonary edema with bilateral infiltrates   Assessment & Plan by Problem: Active Problems:   ESRD on dialysis ([place])   Hyperkalemia   Hemoptysis   M. [name] is a 37 year old African American female with PMHx significant for Systemic Lupus Erythematosus, ESRD on TTS schedule, HTN, Polysubstance Abuse, SVT, New onset Atrial Fibrillation and Long QT ([place]) discovered on 06/21/2018 who presented to the ED on 06/30/18 with respiratory distress and hemoptysis after missing her Tuesday session of hemodialysis.   Respiratory Distress:  She was found to be in acute respiratory distress and had difficulty articulating speech. BiPAP was placed with FiO2 of 60%. CXR revealed significant pulmonary edema. Given history of non-compliance with hemodialysis as she frequently skips HD, SOB is likely due to volume overload resulting in significant pulmonary edema. Patient was re-evaluated after being paged for worsening respiratory distress and not being able to tolerate BiPAP. There was significant amount of blood in the BiPAP mask per ED provider and was transitioned to non-rebreather.  During re-evaluation, patient was found to be hypoxic at 78% on non-rebreather and was subsequently transferred to the ICU for further management.  - Dr. Posey Pronto (Nephrologist) was consulted and arranged from urgent hemodialysis  - Patient will have to be educated on proper compliance with HD when stable.  Hyperkalemia with EKG changes - K on admission was 7 - Received kayexalate 15mg , Calcium gluconate, Novolog 5U - Will continue to monitor  Hemoptysis: Pneumonia vs Pulmonary Edema vs alveolar  hemorrhage - Hemoptysis on presentation to the ED, unable to quantify. This is could either be due to significant pulmonary edema discovered on CXR or possibility of pneumonia was entertained due to pulmonary infiltrates on CXR however less likely as she denied fevers or yellow sputum production. Also, there is possibility of alveolar hemorrhage given patient's history of SLE - Received empiric Cefepime 1g, Vancomycin 1.750mg  to cover pneumonia  - Will monitor after hemodialysis  End Stage Renal Disease: - Nephrology has arranged urgent HD  - Has been on hemodialysis for 5 years and follows up with [facility].  - Non compliant with outpatient HD and will have to be educated after being stabalized  Hypertension:  - BP range on admission 160s-200s/120s-205 - Urgent hemodialysis arranged  - Will hold off of home antihypertensives - Per chart review, she takes Coreg 3.125mg  BID and Hydralazine 25mg  TID at home. Will confirm once patient is stable  Anemia:  - Hgb on admission 10.1 but currently 11.2 - Most likely secondary to renal insufficiency  - Per Nephrology, H and H is acceptable and EPO not recommended at this time.  - Will continue to monitor   Dispo: Admit patient to inpatient with expected length of stay greater then 2 days  F: HD, As needed  E: Replace as needed N: NPO, Renal  Diet  VTE Ppx: SCDs  Code: Full Code   Signed: Jean Rosenthal, MD 06/30/2018, 10:43 AM  Pager: [phone]

## 2018-06-30 NOTE — ED Notes (Signed)
Admitting paged to RN regarding questions about to orders

## 2018-06-30 NOTE — ED Notes (Signed)
Patient vomited/coughed blood into bipap mask. Mask removed. NRB applied. Will notify MD.

## 2018-06-30 NOTE — Consult Note (Signed)
Reason for Consult: Hyperkalemia, pulmonary edema in patient with ESRD Referring Physician: Malvin Johns M.D. (EDP)  HPI:  37 year old African-American woman with past medical history significant for hypertension, systemic lupus erythematosus, polysubstance abuse and end-stage renal disease on hemodialysis on a TTS schedule.  Unfortunately, her course on dialysis has been punctuated by multiple shortened and missed treatments.  She missed her last dialysis treatment on Tuesday of this week and presented earlier to her outpatient kidney center with respiratory distress/hemoptysis and was transferred via EMS to the emergency room where she is found to have hyperkalemia of 6.9 with T wave changes and frank hemoptysis with chest x-ray showing patchy infiltrates.  Her weight appears to be 4.7 kg over her dry weight.  Other than denying fever and chills, she is unable to provide much more input on her review of systems due to respiratory distress-currently on BiPAP.  Dialysis prescription: TTS, [facility], EDW 48 kg, 3 hours and 45 minutes, 180 dialyzer, BFR 400/DFR 600, 2K/2.0 calcium, heparin 2000 unit bolus, Aranesp 200 mcg weekly, calcitriol 0.75 mcg q. dialysis.  No UF profile, no sodium modeling.  Left brachiobasilic fistula.  Past Medical History:  Diagnosis Date  . Anemia    low iron - receives iron at dialysis  . Chronic systolic congestive heart failure ([place]) 03/16/2016  . Dyspnea   . ESRD (end stage renal disease) ([place])    Hemo TTHSAT _ [place]  . H/O pericarditis 01/17/2013  . H/O pleural effusion 01/17/2013  . Heart murmur   . Lupus (systemic lupus erythematosus) ([facility])    Previously followed with Dr. Charlestine Night, has not followed up recently  . 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 Pneumonia    many times  .  Polysubstance abuse ([place])    cocaine, MJ, tobacco  . S/P pericardiocentesis 01/17/2013   H/o pericardial effusion with tamponade 2006   . Seizures ([place])    during pregnancy 1 time    Past Surgical History:  Procedure Laterality Date  . AV FISTULA PLACEMENT    . BASCILIC VEIN TRANSPOSITION Left 02/05/2014   Procedure: [place];  Surgeon: [name], MD;  Location: [place];  Service: Vascular;  Laterality: Left;  . FISTULA SUPERFICIALIZATION Left 05/30/2018   Procedure: FISTULA PLICATION BASILIC VEIN TRANSPOSITION;  Surgeon: Angelia Mould, MD;  Location: [place];  Service: Vascular;  Laterality: Left;  Marland Kitchen VENOGRAM Right 01/31/2014   Procedure: DIALYSIS CATHETER;  Surgeon: Serafina Mitchell, MD;  Location: [facility] CATH LAB;  Service: Cardiovascular;  Laterality: Right;    No family history on file.  Social History:  reports that she has been smoking cigarettes.  She has a 1.80 pack-year smoking history. She has never used smokeless tobacco. She reports that she drinks alcohol. She reports that she has current or past drug history. Drugs: Marijuana and Cocaine. Frequency: 5.00 times per week.  Allergies:  Allergies  Allergen Reactions  . Tobramycin Sulfate Swelling    Medications:  Scheduled: . Chlorhexidine Gluconate Cloth  6 each Topical Q0600    BMP Latest Ref Rng & Units 06/30/2018 06/21/2018 05/30/2018  Glucose 70 - 99 mg/dL 75 126(H) 74  BUN 6 - 20 mg/dL >140(H) 49(H) -  Creatinine 0.44 - 1.00 mg/dL 17.50(H) 9.27(H) -  Sodium 135 - 145 mmol/L 135 140 136  Potassium 3.5 - 5.1 mmol/L 6.9(HH) 3.8  4.8  Chloride 98 - 111 mmol/L 105 95(L) -  CO2 22 - 32 mmol/L - 28 -  Calcium 8.9 - 10.3 mg/dL - 8.9 -   CBC Latest Ref Rng & Units 06/30/2018 06/30/2018 06/21/2018  WBC 4.0 - 10.5 K/uL - 6.2 4.0  Hemoglobin 12.0 - 15.0 g/dL 11.2(L) 10.1(L) 11.5(L)  Hematocrit 36.0 - 46.0 % 33.0(L) 35.2(L) 39.4  Platelets 150 - 400 K/uL - 225 203     Dg Chest Portable 1 View  Result  Date: 06/30/2018 CLINICAL DATA:  Shortness of breath EXAM: PORTABLE CHEST 1 VIEW COMPARISON:  06/21/2018 FINDINGS: Cardiac shadow remains enlarged. Previously seen patchy infiltrates have increased significantly now occupying approximately 2/3 of each lung. Small bilateral pleural effusions are noted. No pneumothorax is seen. No bony abnormality is noted. IMPRESSION: Significant increase in bilateral pulmonary infiltrates. Electronically Signed   By: Inez Catalina M.D.   On: 06/30/2018 08:40    Review of Systems  Unable to perform ROS: Severe respiratory distress (on NIPPV)   Blood pressure (!) 168/120, pulse 100, temperature 98.2 F (36.8 C), temperature source Oral, resp. rate (!) 42, height 5\' 5"  (1.651 m), weight 52.7 kg (116 lb 2.9 oz), SpO2 100 %. Physical Exam  Nursing note and vitals reviewed. Constitutional: She appears well-developed and well-nourished. She appears distressed.  HENT:  Head: Normocephalic and atraumatic.  On NIPPV  Eyes: Pupils are equal, round, and reactive to light.  Neck: JVD present.  JVP to angle of jaw  Cardiovascular: Regular rhythm and normal heart sounds.  No murmur heard. Tachycardic  Respiratory: She is in respiratory distress. She has rales.  On NIPPV with fine rales bilaterally  GI: Soft. Bowel sounds are normal. There is no tenderness. There is no rebound and no guarding.  Musculoskeletal: She exhibits edema.  Trace-1+ LE edema, LUA AVF with thrill  Neurological: She is alert.  Skin: Skin is warm and dry. No rash noted. No erythema.    Assessment/Plan: 1.  Respiratory distress/hemoptysis: Clinical symptoms/chest x-ray raise concern for a combination of volume overload and possible pneumonia.  Currently on noninvasive positive pressure ventilation and I have prescribed urgent hemodialysis for volume unloading to avoid intubation/ventilator dependent respiratory failure as she appears to be getting progressively fatigued.  Agree with empiric  antibiotic treatment at this time to cover for pneumonia. 2.  Hyperkalemia: Secondary to missed and frequently truncated dialysis treatments.  Emergent hemodialysis today. 3.  End-stage renal disease: Prescribed emergent hemodialysis today to continue to resume her TTS schedule and lower her dry weight/manage hyperkalemia.  When stable, will revisit discussions regarding the importance of compliance. 4.  Hypertension: Exacerbated by her respiratory distress/volume status-monitor with hemodialysis and resume antihypertensive agents. 5.  Anemia: Hemoglobin/hematocrit currently acceptable, monitor off of ESA. 6.  Secondary hyperparathyroidism: Restart calcitriol with hemodialysis and resume phosphorus binders when able to eat.  Caylin Nass K. 06/30/2018, 9:07 AM

## 2018-06-30 NOTE — ED Notes (Signed)
Patient verbal request for this RN to call her mother and update on plan of care. Mother contacted and updated at patient request. Patient aware.

## 2018-06-30 NOTE — Progress Notes (Signed)
Pharmacy Antibiotic Note  [name] is a 37 y.o. female admitted on 06/30/2018 with pneumonia.  Pharmacy has been consulted for vancomycin dosing.  Known ESRD patient who missed session on Tuesday and unable to complete today d/t SOB, hemoptysis, and cough. Afebrile. Received 1 dose of cefepime in ED. CXR showing significant increase in bilateral pulmonary infiltrates.   Plan: Vancomycin 1250 mg IV once   Plan for maintenance dose of 500 mg IV with hemodialysis  Monitor plans to continue cefepime once admitted- consider cefepime 1 gram every 24 hours post-HD Monitor HD schedule, clinical pic, and LOT  Height: 5\' 5"  (165.1 cm) Weight: 116 lb 2.9 oz (52.7 kg) IBW/kg (Calculated) : 57  Temp (24hrs), Avg:98.2 F (36.8 C), Min:98.2 F (36.8 C), Max:98.2 F (36.8 C)  Recent Labs  Lab 06/30/18 0808 06/30/18 0813  WBC 6.2  --   CREATININE  --  17.50*    Estimated Creatinine Clearance: 3.8 mL/min (A) (by C-G formula based on SCr of 17.5 mg/dL (H)).    Allergies  Allergen Reactions  . Tobramycin Sulfate Swelling    Antimicrobials this admission: Vancomycin 7/4 >>  Cefepime 7/4 >>   Dose adjustments this admission: N/A  Microbiology results: None  Thank you for allowing pharmacy to be a part of this patient's care.  Doylene Canard, PharmD Clinical Pharmacist  Pager: [phone] Phone: [phone] 06/30/2018 9:05 AM

## 2018-06-30 NOTE — Progress Notes (Signed)
RT called to patient room due to patient vomiting blood while on bipap.  Upon arrival patient had been taken off of bipap and placed on non-rebreather mask.  Unable to obtain a oxygen saturation with a good waveform therefore will obtain an ABG.  Will continue to monitor.

## 2018-07-01 ENCOUNTER — Other Ambulatory Visit: Payer: Self-pay

## 2018-07-01 DIAGNOSIS — J9601 Acute respiratory failure with hypoxia: Secondary | ICD-10-CM

## 2018-07-01 LAB — GLUCOSE, CAPILLARY
GLUCOSE-CAPILLARY: 69 mg/dL — AB (ref 70–99)
GLUCOSE-CAPILLARY: 66 mg/dL — AB (ref 70–99)
Glucose-Capillary: 74 mg/dL (ref 70–99)
Glucose-Capillary: 71 mg/dL (ref 70–99)

## 2018-07-01 LAB — CBC
HCT: 31.3 % — ABNORMAL LOW (ref 36.0–46.0)
HEMOGLOBIN: 9.2 g/dL — AB (ref 12.0–15.0)
MCH: 28.2 pg (ref 26.0–34.0)
MCHC: 29.4 g/dL — ABNORMAL LOW (ref 30.0–36.0)
MCV: 96 fL (ref 78.0–100.0)
PLATELETS: 219 10*3/uL (ref 150–400)
RBC: 3.26 MIL/uL — AB (ref 3.87–5.11)
RDW: 22.3 % — ABNORMAL HIGH (ref 11.5–15.5)
WBC: 6.4 10*3/uL (ref 4.0–10.5)

## 2018-07-01 LAB — HIV ANTIBODY (ROUTINE TESTING W REFLEX): HIV Screen 4th Generation wRfx: NONREACTIVE

## 2018-07-01 LAB — RENAL FUNCTION PANEL
Albumin: 2.3 g/dL — ABNORMAL LOW (ref 3.5–5.0)
Anion gap: 15 (ref 5–15)
BUN: 47 mg/dL — ABNORMAL HIGH (ref 6–20)
CO2: 27 mmol/L (ref 22–32)
Calcium: 8.2 mg/dL — ABNORMAL LOW (ref 8.9–10.3)
Chloride: 95 mmol/L — ABNORMAL LOW (ref 98–111)
Creatinine, Ser: 8.89 mg/dL — ABNORMAL HIGH (ref 0.44–1.00)
GFR, EST AFRICAN AMERICAN: 6 mL/min — AB (ref >60)
GFR, EST NON AFRICAN AMERICAN: 5 mL/min — AB (ref >60)
Glucose, Bld: 70 mg/dL (ref 70–99)
Phosphorus: 8.6 mg/dL — ABNORMAL HIGH (ref 2.5–4.6)
Potassium: 3.9 mmol/L (ref 3.5–5.1)
SODIUM: 137 mmol/L (ref 135–145)

## 2018-07-01 MED ORDER — CARVEDILOL 3.125 MG PO TABS
3.1250 mg | ORAL_TABLET | Freq: Two times a day (BID) | ORAL | Status: DC
  Administered 2018-07-01: 3.125 mg via ORAL
  Filled 2018-07-01 (×2): qty 1

## 2018-07-01 MED ORDER — ORAL CARE MOUTH RINSE
15.0000 mL | OROMUCOSAL | Status: DC
  Administered 2018-07-01: 15 mL via OROMUCOSAL

## 2018-07-01 MED ORDER — ORAL CARE MOUTH RINSE
15.0000 mL | Freq: Two times a day (BID) | OROMUCOSAL | Status: DC
  Administered 2018-07-02 – 2018-07-03 (×3): 15 mL via OROMUCOSAL

## 2018-07-01 MED ORDER — CHLORHEXIDINE GLUCONATE 0.12% ORAL RINSE (MEDLINE KIT)
15.0000 mL | Freq: Two times a day (BID) | OROMUCOSAL | Status: DC
  Administered 2018-07-01: 15 mL via OROMUCOSAL

## 2018-07-01 MED ORDER — CALCIUM ACETATE 667 MG PO CAPS
2001.0000 mg | ORAL_CAPSULE | ORAL | Status: DC
Start: 2018-07-01 — End: 2018-07-01

## 2018-07-01 MED ORDER — ALPRAZOLAM 0.5 MG PO TABS
0.5000 mg | ORAL_TABLET | Freq: Three times a day (TID) | ORAL | Status: DC | PRN
  Administered 2018-07-01 – 2018-07-04 (×7): 0.5 mg via ORAL
  Filled 2018-07-01 (×7): qty 1

## 2018-07-01 MED ORDER — DARBEPOETIN ALFA 60 MCG/0.3ML IJ SOSY
60.0000 ug | PREFILLED_SYRINGE | INTRAMUSCULAR | Status: DC
  Administered 2018-07-02: 60 ug via INTRAVENOUS
  Filled 2018-07-01: qty 0.3

## 2018-07-01 MED ORDER — VANCOMYCIN HCL 500 MG IV SOLR
500.0000 mg | Freq: Once | INTRAVENOUS | Status: DC
  Filled 2018-07-01: qty 500

## 2018-07-01 NOTE — Plan of Care (Signed)

## 2018-07-01 NOTE — Procedures (Signed)
Extubation Procedure Note  Patient Details:   Name: [name] DOB: 09/27/1981 MRN: [id number]   Airway Documentation:    Vent end date: 07/01/18 Vent end time: 1050   Evaluation  O2 sats: stable throughout Complications: No apparent complications Patient did tolerate procedure well. Bilateral Breath Sounds: Clear, Diminished   Yes,  Placed on 4L [place]   Gonzella Lex 07/01/2018, 10:58 AM

## 2018-07-01 NOTE — Progress Notes (Signed)
Patient ID: [name], female   DOB: 1981/06/07, 37 y.o.   MRN: [id number] [facility] Progress Note   Assessment/ Plan:   1.  Ventilator Dependent Respiratory Failure: Presented with hypoxia/respiratory distress/hemoptysis and intubated after progression to respiratory failure from fatigue. Etiology likely pulmonary edema and ?infection in patient with prior history of SLE. Underwent HD/UF yesterday with 3.3L UF. Anticipated wean today.   2.  Hyperkalemia: Secondary to missed and frequently truncated dialysis treatments. Corrected with emergent hemodialysis yesterday. 3.  End-stage renal disease: Underwent HD yesterday for volume unloading and treatment of hyperkalemia. Plan for next dialysis tomorrow. When stable, will revisit discussions regarding the importance of compliance. 4.  Hypertension: Exacerbated by her respiratory distress/volume status- corrected with hemodialysis and sedation. 5.  Anemia: Hemoglobin/hematocrit currently trending down (overt loss from hemoptysis noted), will restart ESA. 6.  Secondary hyperparathyroidism: Restart oral calcitriol with hemodialysis and resume phosphorus binders when able to eat.  Subjective:   Intubated emergently yesterday after increasing respiratory distress and hypoxic respiratory failure.   Objective:   BP 94/60   Pulse 79   Temp 99.5 F (37.5 C) (Oral)   Resp (!) 26   Ht _0  (1.575 m)   Wt 50.9 kg (112 lb 3.4 oz)   SpO2 100%   BMI 20.52 kg/m   Physical Exam: VQX:[id number]/MKLKJZP, comfortable HXT:AVWPV regular rhythm and normal rate, normal s1 and s2 Resp:coarse breath sounds bilaterally XYI:AXKP, flat, non tender, normal bowel sounds Ext:no LE edema, pulsatile/aneurysmal LUA AVF  Labs: BMET Recent Labs  Lab 06/30/18 0808 06/30/18 0813 07/01/18 0400  NA 139 135 137  K 7.0* 6.9* 3.9  CL 100 105 95*  CO2 16*  --  27  GLUCOSE 75 75 70  BUN 141* >140* 47*  CREATININE 16.34* 17.50* 8.89*  CALCIUM 9.2   --  8.2*  PHOS  --   --  8.6*   CBC Recent Labs  Lab 06/30/18 0808 06/30/18 0813 07/01/18 0400  WBC 6.2  --  6.4  NEUTROABS 4.9  --   --   HGB 10.1* 11.2* 9.2*  HCT 35.2* 33.0* 31.3*  MCV 96.2  --  96.0  PLT 225  --  219   Medications:    . calcitRIOL  0.75 mcg Oral Q T,Th,Sa-HD  . chlorhexidine gluconate (MEDLINE KIT)  15 mL Mouth Rinse BID  . Chlorhexidine Gluconate Cloth  6 each Topical Q0600  . mouth rinse  15 mL Mouth Rinse 10 times per day  . pantoprazole sodium  40 mg Per Tube Daily   Elmarie Shiley, MD 07/01/2018, 8:07 AM

## 2018-07-01 NOTE — Progress Notes (Addendum)
PULMONARY / CRITICAL CARE MEDICINE   Name: [name] MRN: [id number] DOB: 12-20-1981    ADMISSION DATE:  06/30/2018 CONSULTATION DATE:  06/30/2018  CHIEF COMPLAINT:  Hemoptysis and dyspnea  BRIEF PATIENT DESCRIPTION:   This is a 37 year old female with ESRD on HD secondary to lupus nephritis.  She presented to the ED with hemoptysis and extreme dyspnea.  No fevers, chills, sweats, or purulent cough. Denies nosebleeds. Also with hematemesis. Patient emergently intubated shortly after presentation to ED on 7/4.  She missed an HD session prior to admission.  SUBJECTIVE:  Patient very agitated this morning and wants her ETT removed as well as her mittens. Also complaining of pain in her R eye.   VITAL SIGNS: BP 94/60   Pulse 79   Temp 99.5 F (37.5 C) (Oral)   Resp (!) 26   Ht 5\' 2"  (1.575 m)   Wt 112 lb 3.4 oz (50.9 kg)   SpO2 100%   BMI 20.52 kg/m   HEMODYNAMICS:    VENTILATOR SETTINGS: Vent Mode: CPAP;PSV FiO2 (%):  [40 %-100 %] 40 % Set Rate:  [22 bmp-26 bmp] 26 bmp Vt Set:  [400 mL] 400 mL PEEP:  [7 cmH20] 7 cmH20 Pressure Support:  [7 cmH20] 7 cmH20 Plateau Pressure:  [18 cmH20-28 cmH20] 18 cmH20  INTAKE / OUTPUT: I/O last 3 completed shifts: In: 1003 [I.V.:443; IV Piggyback:560] Out: 3272 [Other:3272]  PHYSICAL EXAMINATION: General: NAD, thin female laying in bed HEENT:injected conjunctiva bilaterally with some clear discharge noted on the R, PERRLA. ETT in place on vent.  Neuro: A&O and agitated  Cardiovascular: RRR, no m/r/g, no LE edema Respiratory: CTA BL, normal work of breathing Gastrointestinal: soft, nontender, nondistended, normoactive BS Extremities: moves all 4 extremities equally, no edema Derm: no rashes appreciated  LABS:  BMET Recent Labs  Lab 06/30/18 0808 06/30/18 0813 07/01/18 0400  NA 139 135 137  K 7.0* 6.9* 3.9  CL 100 105 95*  CO2 16*  --  27  BUN 141* >140* 47*  CREATININE 16.34* 17.50* 8.89*  GLUCOSE 75 75 70     Electrolytes Recent Labs  Lab 06/30/18 0808 07/01/18 0400  CALCIUM 9.2 8.2*  PHOS  --  8.6*    CBC Recent Labs  Lab 06/30/18 0808 06/30/18 0813 07/01/18 0400  WBC 6.2  --  6.4  HGB 10.1* 11.2* 9.2*  HCT 35.2* 33.0* 31.3*  PLT 225  --  219    Coag's Recent Labs  Lab 06/30/18 1011  APTT 35  INR 1.14    Sepsis Markers No results for input(s): LATICACIDVEN, PROCALCITON, O2SATVEN in the last 168 hours.  ABG Recent Labs  Lab 06/30/18 1139 06/30/18 1327 06/30/18 1603  PHART 7.233* 7.159* 7.449  PCO2ART 45.9 59.9* 48.8*  PO2ART 66.0* 105.0 238.0*    Liver Enzymes Recent Labs  Lab 07/01/18 0400  ALBUMIN 2.3*    Cardiac Enzymes No results for input(s): TROPONINI, PROBNP in the last 168 hours.  Glucose Recent Labs  Lab 06/30/18 1319 06/30/18 1324 07/01/18 0720 07/01/18 0724  GLUCAP <10* 83 69* 74   Imaging Dg Chest Portable 1 View  Result Date: 06/30/2018 CLINICAL DATA:  Status post intubation EXAM: PORTABLE CHEST 1 VIEW COMPARISON:  06/30/2018 FINDINGS: Endotracheal tube and nasogastric catheter are now seen. The tip of the nasogastric catheter is not visualized although is felt to lie within the stomach. Diffuse bilateral infiltrative changes are again seen and slightly increased when compared with the prior exam particularly in the left  upper lobe. This likely represents some superimposed pulmonary edema. IMPRESSION: Placement of endotracheal tube and nasogastric catheter in satisfactory position. Increasing density within the lungs bilaterally. Electronically Signed   By: Alcide Clever M.D.   On: 06/30/2018 13:13   STUDIES:   CULTURES: BCx 7/04>> Tracheal aspirate 7/04>>  ANTIBIOTICS: Cefepime x1 Vanc 7/4>>7/5 Azithromycin 7/04>> CTX 7/04>>  SIGNIFICANT EVENTS: 07/04 Admitted and emergently intubated  LINES/TUBES: L femoral art line 7/4>> ETT 7/04>>7/05  ASSESSMENT / PLAN:  PULMONARY A: Acute respiratory failure ?Pneumonia- noted  on CXR on 6/25 in RML with no tx Hemoptysis P:   Wean per protocol, will extubate Repeat CXR 7/6 Continue abx azithro and CTX, will d/c vanc given negative MRSA PCR  CARDIOVASCULAR A:  Hypertensive Emergency H/o cocaine use with last positive drug screen 6/03- UDS pending P:  Improved s/p HD on 7/4  RENAL A:   ESRD on HD 2/2 lupus nephritis Secondary hyperparathyroidism Volume overload- managed by HD P:   Nephrology following  Trend BMP / urinary output Replace electrolytes as indicated Avoid nephrotoxic agents, ensure adequate renal perfusion  GASTROINTESTINAL A:   Hematemesis P:   Protonix for prophylaxis  HEMATOLOGIC A:   Anemia- no further s/s blood loss P:  CBC Transfuse per protocol SCD's for prophylaxis given hemptysis  INFECTIOUS A:   Lupus- chronically immunosuppressed with Plaquenil for history of lupus  P:   Currently on Azithromycin and CTX   ENDOCRINE A:   Secondary hyperparathyroidism   P:   Nephrology following  NEUROLOGIC A:   No active issues P:   RASS goal: 0 Limit sedating medications if anticipate extubation   EYE A: Conjunctivitis P: Monitor and start abx drops if concern for bacterial infection  FAMILY  - Updates: Family at bedside and updated 7/5.  - Inter-disciplinary family meet or Palliative Care meeting due by: 7/11, day 7  [place] Enslee Bibbins, DO PGY-2, [facility]  07/01/2018, 8:17 AM

## 2018-07-02 ENCOUNTER — Inpatient Hospital Stay (HOSPITAL_COMMUNITY): Payer: Medicaid Other

## 2018-07-02 DIAGNOSIS — R042 Hemoptysis: Secondary | ICD-10-CM

## 2018-07-02 DIAGNOSIS — N186 End stage renal disease: Secondary | ICD-10-CM

## 2018-07-02 DIAGNOSIS — Z992 Dependence on renal dialysis: Secondary | ICD-10-CM

## 2018-07-02 LAB — CULTURE, RESPIRATORY W GRAM STAIN

## 2018-07-02 LAB — BASIC METABOLIC PANEL
ANION GAP: 16 — AB (ref 5–15)
BUN: 68 mg/dL — ABNORMAL HIGH (ref 6–20)
CALCIUM: 7.9 mg/dL — AB (ref 8.9–10.3)
CO2: 27 mmol/L (ref 22–32)
Chloride: 94 mmol/L — ABNORMAL LOW (ref 98–111)
Creatinine, Ser: 11.52 mg/dL — ABNORMAL HIGH (ref 0.44–1.00)
GFR calc non Af Amer: 4 mL/min — ABNORMAL LOW (ref >60)
GFR, EST AFRICAN AMERICAN: 4 mL/min — AB (ref >60)
Glucose, Bld: 91 mg/dL (ref 70–99)
POTASSIUM: 3.9 mmol/L (ref 3.5–5.1)
Sodium: 137 mmol/L (ref 135–145)

## 2018-07-02 LAB — CBC
HEMATOCRIT: 27.8 % — AB (ref 36.0–46.0)
HEMOGLOBIN: 8.2 g/dL — AB (ref 12.0–15.0)
MCH: 27.3 pg (ref 26.0–34.0)
MCHC: 29.5 g/dL — ABNORMAL LOW (ref 30.0–36.0)
MCV: 92.7 fL (ref 78.0–100.0)
Platelets: 198 10*3/uL (ref 150–400)
RBC: 3 MIL/uL — AB (ref 3.87–5.11)
RDW: 21.3 % — ABNORMAL HIGH (ref 11.5–15.5)
WBC: 5 10*3/uL (ref 4.0–10.5)

## 2018-07-02 LAB — CULTURE, RESPIRATORY: CULTURE: NORMAL

## 2018-07-02 MED ORDER — DARBEPOETIN ALFA 60 MCG/0.3ML IJ SOSY
PREFILLED_SYRINGE | INTRAMUSCULAR | Status: AC
  Administered 2018-07-02: 60 ug via INTRAVENOUS
  Filled 2018-07-02: qty 0.3

## 2018-07-02 MED ORDER — PANTOPRAZOLE SODIUM 40 MG PO TBEC
40.0000 mg | DELAYED_RELEASE_TABLET | Freq: Every day | ORAL | Status: DC
  Administered 2018-07-02 – 2018-07-03 (×2): 40 mg via ORAL
  Filled 2018-07-02 (×2): qty 1

## 2018-07-02 MED ORDER — CARVEDILOL 6.25 MG PO TABS
6.2500 mg | ORAL_TABLET | Freq: Two times a day (BID) | ORAL | Status: DC
  Administered 2018-07-02 – 2018-07-04 (×4): 6.25 mg via ORAL
  Filled 2018-07-02 (×5): qty 1

## 2018-07-02 MED ORDER — HEPARIN SODIUM (PORCINE) 1000 UNIT/ML DIALYSIS
40.0000 [IU]/kg | INTRAMUSCULAR | Status: DC | PRN
  Administered 2018-07-02: 2000 [IU] via INTRAVENOUS_CENTRAL

## 2018-07-02 MED ORDER — CALCIUM ACETATE (PHOS BINDER) 667 MG PO CAPS
2001.0000 mg | ORAL_CAPSULE | Freq: Three times a day (TID) | ORAL | Status: DC
  Administered 2018-07-02 – 2018-07-04 (×7): 2001 mg via ORAL
  Filled 2018-07-02 (×8): qty 3

## 2018-07-02 NOTE — Progress Notes (Addendum)
PULMONARY / CRITICAL CARE MEDICINE   Name: [name] MRN: [id number] DOB: Apr 19, 1981    ADMISSION DATE:  06/30/2018 CONSULTATION DATE:  06/30/2018  CHIEF COMPLAINT:  Hemoptysis and dyspnea  BRIEF PATIENT DESCRIPTION:   This is a 37 year old female with ESRD on HD secondary to lupus nephritis.  She presented to the ED with hemoptysis and extreme dyspnea.  No fevers, chills, sweats, or purulent cough. Denies nosebleeds. Also with hematemesis. Patient emergently intubated shortly after presentation to ED on 7/4.  She missed an HD session prior to admission. Extubated on 7/5.   SUBJECTIVE:  Patient complaining of crampy abdominal pain, this it was worse after eating ice chips. There is a doritos bag by the bed. She is receiving HD now. Says she missed HD bc she could not get a babysitter.   VITAL SIGNS: BP (!) 128/91   Pulse 100   Temp 99.9 F (37.7 C) (Oral)   Resp 20   Ht 5\' 2"  (1.575 m)   Wt 112 lb 3.4 oz (50.9 kg)   SpO2 99%   BMI 20.52 kg/m   HEMODYNAMICS:    VENTILATOR SETTINGS: Vent Mode: CPAP;PSV FiO2 (%):  [40 %] 40 % PEEP:  [7 cmH20] 7 cmH20 Pressure Support:  [7 cmH20] 7 cmH20  INTAKE / OUTPUT: I/O last 3 completed shifts: In: 1615.3 [P.O.:480; I.V.:435.4; IV Piggyback:699.9] Out: 500 [Emesis/NG output:500]  PHYSICAL EXAMINATION: General: NAD, thin female, getting HD at bedside HEENT: R eye with injected conjuctiva, PERRLA. Neuro: A&O, follows commands Cardiovascular: RRR, no m/r/g, no LE edema Respiratory: CTA BL, normal work of breathing Gastrointestinal: soft, nontender, nondistended, normoactive BS x4 Extremities: moves 4 extremities equally Derm: no rashes appreciated Psych: Appropriate affect  LABS:  BMET Recent Labs  Lab 06/30/18 0808 06/30/18 0813 07/01/18 0400 07/02/18 0500  NA 139 135 137 137  K 7.0* 6.9* 3.9 3.9  CL 100 105 95* 94*  CO2 16*  --  27 27  BUN 141* >140* 47* 68*  CREATININE 16.34* 17.50* 8.89* 11.52*  GLUCOSE 75 75  70 91    Electrolytes Recent Labs  Lab 06/30/18 0808 07/01/18 0400 07/02/18 0500  CALCIUM 9.2 8.2* 7.9*  PHOS  --  8.6*  --     CBC Recent Labs  Lab 06/30/18 0808 06/30/18 0813 07/01/18 0400 07/02/18 0500  WBC 6.2  --  6.4 5.0  HGB 10.1* 11.2* 9.2* 8.2*  HCT 35.2* 33.0* 31.3* 27.8*  PLT 225  --  219 198    Coag's Recent Labs  Lab 06/30/18 1011  APTT 35  INR 1.14    Sepsis Markers No results for input(s): LATICACIDVEN, PROCALCITON, O2SATVEN in the last 168 hours.  ABG Recent Labs  Lab 06/30/18 1139 06/30/18 1327 06/30/18 1603  PHART 7.233* 7.159* 7.449  PCO2ART 45.9 59.9* 48.8*  PO2ART 66.0* 105.0 238.0*    Liver Enzymes Recent Labs  Lab 07/01/18 0400  ALBUMIN 2.3*    Cardiac Enzymes No results for input(s): TROPONINI, PROBNP in the last 168 hours.  Glucose Recent Labs  Lab 06/30/18 1319 06/30/18 1324 07/01/18 0720 07/01/18 0724 07/01/18 1104 07/01/18 1105  GLUCAP <10* 83 69* 74 66* 71   Imaging Dg Chest Port 1 View  Result Date: 07/02/2018 CLINICAL DATA:  Acute respiratory distress. EXAM: PORTABLE CHEST 1 VIEW COMPARISON:  06/30/2018. FINDINGS: The heart is enlarged. BILATERAL pulmonary opacities are improved, either clearing edema or pneumonia. Endotracheal tube has been removed. Retrocardiac opacity persists. IMPRESSION: Improved aeration.  Extubated. Electronically Signed  By: Elsie Stain M.D.   On: 07/02/2018 07:00   STUDIES:   CULTURES: BCx 7/04>> Tracheal aspirate 7/04>>  ANTIBIOTICS: Cefepime x1 Vanc 7/4>>7/5 Azithromycin 7/04>> CTX 7/04>>  SIGNIFICANT EVENTS: 07/04 Admitted and emergently intubated  LINES/TUBES: L femoral art line 7/4>> ETT 7/04>>7/05  ASSESSMENT / PLAN:  PULMONARY A: Acute respiratory failure- extubated 7/5, improving Community Acquired Pneumonia- noted on CXR on 6/25 in RML with no tx Hemoptysis- improving P:   Extubated 7/5 Repeat CXR improved 7/6 Will continue azithro and CTX for CAP    CARDIOVASCULAR A:  Hypertension H/o cocaine use with last positive drug screen 6/03- serum drug screen pending (collected >24hr after admission) P:  Improved s/p HD 7/4 and plan for HD 7/6 Reports only taking carvedilol at home (imdur and hydralazine also on home med list)  RENAL A:   ESRD on HD 2/2 lupus nephritis Secondary hyperparathyroidism Volume overload- managed by HD P:   Nephrology following Trend BMP / urinary output Replace electrolytes as indicated Avoid nephrotoxic agents, ensure adequate renal perfusion  GASTROINTESTINAL A:   Hematemesis P:   Protonix for prophylaxis  HEMATOLOGIC A:   Anemia- no further s/s blood loss P:  CBC Transfuse per protocol SCD's for prophylaxis given hemoptysis  INFECTIOUS A:   Lupus- chronically immunosuppressed with Plaquenil for history of lupus (unsure of compliance) P:   Currently on azithro and CTX  ENDOCRINE A:   Secondary hyperparathyroidism   P:   Nephrology following  NEUROLOGIC A:   No active issues P:   RASS goal: 0 Limit sedating medications   EYE A: Conjunctivitis P: Monitor and start abx drops if concern for bacterial infection  FAMILY  - Updates: Family not at bedside 7/6.  - Inter-disciplinary family meet or Palliative Care meeting due by: 7/11, day 7  -Stable for transfer out of ICU  [place] Sereen Schaff, DO PGY-2, [facility]  07/02/2018, 7:20 AM

## 2018-07-02 NOTE — Progress Notes (Signed)
Transfer Note:   Traveling Method: WC Transferring Unit: 2MW Mental Orientation: A&O X4 Telemetry: Not Ordered Assessment: Completed Skin: WDL IV: Clean, dry and intact. Pain: 4/10 Tubes: SCD's Safety Measures: Safety Fall Prevention Plan has been given, discussed and signed Admission: Completed 54M Orientation: Patient has been orientated to the room, unit and staff.   Orders have been reviewed and implemented. Will continue to monitor the patient. Call light has been placed within reach and bed alarm has been activated.   Dixie Dials BSN, RN

## 2018-07-02 NOTE — Progress Notes (Signed)
Patient completed 4 hour HD treatment.  Increased restlessness and irritability last 2 hours of trmt.  Staff RN medicated with prn xanax.  UF appox 3700 cc.  Provided support and reinforced HD outpatient education, including missed treatment, shortened treatments, fluid restrictions, and diet restrictions/guidelines.  She stated she know all of this, and then requested no more information be given.  Right hand IV also dislodged during ongoing movement/restlessness.  Virgie Dad, RN notified of treatment outcomes.

## 2018-07-02 NOTE — Progress Notes (Signed)
Patient ID: [name], female   DOB: 06-11-1981, 37 y.o.   MRN: [id number] [facility] Progress Note   Assessment/ Plan:   1.  Ventilator Dependent Respiratory Failure: Presented with hypoxia/respiratory distress/hemoptysis and intubated after progression to respiratory failure from fatigue.   Component of pulmonary edema improved with ultrafiltration/hemodialysis and now extubated overnight with anticipated transfer to stepdown unit/telemetry today.   2.  Hyperkalemia: Corrected with hemodialysis-reeducated regarding completing her full dialysis treatments as well as dietary potassium restriction. 3.  End-stage renal disease: Continue hemodialysis on a Tuesday/Thursday/Saturday schedule re-educated regarding compliance. 4.  Hypertension: Improved with ultrafiltration/hemodialysis. 5.  Anemia: Hemoglobin/hematocrit currently trending down (overt loss from hemoptysis noted), darbepoetin today. 6.  Secondary hyperparathyroidism: Restart oral calcitriol with hemodialysis and resume phosphorus binders today.  Subjective:   With abdominal pain overnight, sneaked in fast food/snacks.  Extubated successfully.   Objective:   BP (!) 128/91   Pulse 100   Temp 99.9 F (37.7 C) (Oral)   Resp 20   Ht 5\' 2"  (1.575 m)   Wt 50.9 kg (112 lb 3.4 oz)   SpO2 99%   BMI 20.52 kg/m   Physical Exam: Gen: On hemodialysis, comfortable-extubated JJO:ACZYS regular rhythm and normal rate, normal s1 and s2 Resp: Anteriorly clear to auscultation, no rales/rhonchi AYT:KZSW, flat, non tender, normal bowel sounds Ext:no LE edema, pulsatile/aneurysmal LUA AVF  Labs: BMET Recent Labs  Lab 06/30/18 0808 06/30/18 0813 07/01/18 0400 07/02/18 0500  NA 139 135 137 137  K 7.0* 6.9* 3.9 3.9  CL 100 105 95* 94*  CO2 16*  --  27 27  GLUCOSE 75 75 70 91  BUN 141* >140* 47* 68*  CREATININE 16.34* 17.50* 8.89* 11.52*  CALCIUM 9.2  --  8.2* 7.9*  PHOS  --   --  8.6*  --    CBC Recent Labs   Lab 06/30/18 0808 06/30/18 0813 07/01/18 0400 07/02/18 0500  WBC 6.2  --  6.4 5.0  NEUTROABS 4.9  --   --   --   HGB 10.1* 11.2* 9.2* 8.2*  HCT 35.2* 33.0* 31.3* 27.8*  MCV 96.2  --  96.0 92.7  PLT 225  --  219 198   Medications:    . calcitRIOL  0.75 mcg Oral Q T,Th,Sa-HD  . carvedilol  3.125 mg Oral BID WC  . Chlorhexidine Gluconate Cloth  6 each Topical Q0600  . darbepoetin (ARANESP) injection - DIALYSIS  60 mcg Intravenous Q Sat-HD  . mouth rinse  15 mL Mouth Rinse BID  . pantoprazole sodium  40 mg Per Tube Daily   Elmarie Shiley, MD 07/02/2018, 7:34 AM

## 2018-07-02 NOTE — Procedures (Signed)
Patient seen on Hemodialysis. QB 400, UF goal 4.5L Treatment adjusted as needed.  Elmarie Shiley MD [facility] Office # [phone] Pager # [phone] 7:37 AM

## 2018-07-02 NOTE — Progress Notes (Signed)
Multiple attempts made to contact attending provider. Paged without return of call.

## 2018-07-02 NOTE — Progress Notes (Signed)
Patient reports stomach cramping and tightness to left upper abdominal quadrant. Fast food wrappers in trash can from evening visitor, including fried chicken. Patient has personal bag at bedside with snacks inside, currently eating doritos while reporting abdominal discomfort. Patient educated on current diet orders and restrictions and medications given for intubation and sedation used during intubation can alter intestinal motility. Patient advised to stop eating snacks from bag until stomach discomfort resolves.

## 2018-07-02 NOTE — Progress Notes (Signed)
[place] information added to AVS for patient to call and schedule appointment to establish a PCP.

## 2018-07-03 DIAGNOSIS — J189 Pneumonia, unspecified organism: Secondary | ICD-10-CM

## 2018-07-03 LAB — RENAL FUNCTION PANEL
ALBUMIN: 2.2 g/dL — AB (ref 3.5–5.0)
Anion gap: 13 (ref 5–15)
BUN: 42 mg/dL — AB (ref 6–20)
CO2: 28 mmol/L (ref 22–32)
CREATININE: 7.57 mg/dL — AB (ref 0.44–1.00)
Calcium: 9 mg/dL (ref 8.9–10.3)
Chloride: 96 mmol/L — ABNORMAL LOW (ref 98–111)
GFR, EST AFRICAN AMERICAN: 7 mL/min — AB (ref >60)
GFR, EST NON AFRICAN AMERICAN: 6 mL/min — AB (ref >60)
Glucose, Bld: 82 mg/dL (ref 70–99)
PHOSPHORUS: 6.8 mg/dL — AB (ref 2.5–4.6)
POTASSIUM: 3.6 mmol/L (ref 3.5–5.1)
Sodium: 137 mmol/L (ref 135–145)

## 2018-07-03 LAB — CBC
HEMATOCRIT: 33.8 % — AB (ref 36.0–46.0)
Hemoglobin: 9.7 g/dL — ABNORMAL LOW (ref 12.0–15.0)
MCH: 27.2 pg (ref 26.0–34.0)
MCHC: 28.7 g/dL — ABNORMAL LOW (ref 30.0–36.0)
MCV: 94.7 fL (ref 78.0–100.0)
Platelets: 166 10*3/uL (ref 150–400)
RBC: 3.57 MIL/uL — AB (ref 3.87–5.11)
RDW: 21.2 % — AB (ref 11.5–15.5)
WBC: 3.6 10*3/uL — AB (ref 4.0–10.5)

## 2018-07-03 MED ORDER — ACETAMINOPHEN 325 MG PO TABS
650.0000 mg | ORAL_TABLET | Freq: Four times a day (QID) | ORAL | Status: DC | PRN
  Administered 2018-07-03 (×2): 650 mg via ORAL
  Filled 2018-07-03 (×2): qty 2

## 2018-07-03 MED ORDER — SACCHAROMYCES BOULARDII 250 MG PO CAPS
250.0000 mg | ORAL_CAPSULE | Freq: Two times a day (BID) | ORAL | Status: DC
  Administered 2018-07-03 (×2): 250 mg via ORAL
  Filled 2018-07-03 (×2): qty 1

## 2018-07-03 NOTE — Progress Notes (Signed)
PULMONARY / CRITICAL CARE MEDICINE   Name: [name] MRN: [id number] DOB: 1981-02-12    ADMISSION DATE:  06/30/2018 CONSULTATION DATE:  06/30/2018  CHIEF COMPLAINT:  Hemoptysis and dyspnea  BRIEF PATIENT DESCRIPTION:   37 yo female presented to ER with hemoptysis, dyspnea, hypoxia.  Required intubation.  Tx for PNA and acute pulmonary edema.  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  SUBJECTIVE:  Still gets winded.  Not as much cough.  No hemoptysis.  VITAL SIGNS: BP 123/79 (BP Location: Right Arm)   Pulse 88   Temp 98.2 F (36.8 C) (Oral)   Resp 18   Ht 5\' 2"  (1.575 m)   Wt 100 lb 15.5 oz (45.8 kg)   SpO2 100%   BMI 18.47 kg/m   INTAKE / OUTPUT: I/O last 3 completed shifts: In: 1080 [P.O.:1080] Out: 3614 [Other:3614]  PHYSICAL EXAMINATION:  General - pleasant Eyes - pupils reactive ENT - no sinus tenderness, no oral exudate, no LAN Cardiac - regular, no murmur Chest - no wheeze, rales Abd - soft, non tender Ext - no edema Skin - no rashes Neuro - normal strength Psych - normal mood   LABS:  BMET Recent Labs  Lab 07/01/18 0400 07/02/18 0500 07/03/18 0709  NA 137 137 137  K 3.9 3.9 3.6  CL 95* 94* 96*  CO2 27 27 28   BUN 47* 68* 42*  CREATININE 8.89* 11.52* 7.57*  GLUCOSE 70 91 82    Electrolytes Recent Labs  Lab 07/01/18 0400 07/02/18 0500 07/03/18 0709  CALCIUM 8.2* 7.9* 9.0  PHOS 8.6*  --  6.8*    CBC Recent Labs  Lab 07/01/18 0400 07/02/18 0500 07/03/18 0709  WBC 6.4 5.0 3.6*  HGB 9.2* 8.2* 9.7*  HCT 31.3* 27.8* 33.8*  PLT 219 198 166    Coag's Recent Labs  Lab 06/30/18 1011  APTT 35  INR 1.14    Sepsis Markers No results for input(s): LATICACIDVEN, PROCALCITON, O2SATVEN in the last 168 hours.  ABG Recent Labs  Lab 06/30/18 1139 06/30/18 1327 06/30/18 1603  PHART 7.233* 7.159* 7.449  PCO2ART 45.9 59.9* 48.8*  PO2ART 66.0* 105.0 238.0*    Liver Enzymes Recent  Labs  Lab 07/01/18 0400 07/03/18 0709  ALBUMIN 2.3* 2.2*    Cardiac Enzymes No results for input(s): TROPONINI, PROBNP in the last 168 hours.  Glucose Recent Labs  Lab 06/30/18 1319 06/30/18 1324 07/01/18 0720 07/01/18 0724 07/01/18 1104 07/01/18 1105  GLUCAP <10* 83 69* 74 66* 71   Imaging Dg Chest Port 1 View  Result Date: 07/02/2018 CLINICAL DATA:  Respiratory distress EXAM: PORTABLE CHEST 1 VIEW COMPARISON:  Study obtained earlier in the day FINDINGS: There is patchy infiltrate in both upper lobes, more on the left than on the right and in the right base region. There is a fairly small left pleural effusion with patchy consolidation in the left lower lobe with associated atelectasis. Heart is enlarged with pulmonary vascularity within normal limits. No adenopathy. No bone lesions. IMPRESSION: Multifocal pneumonia. Persistent left pleural effusion. Stable cardiomegaly. No appreciable new opacity. Electronically Signed   By: Lowella Grip III M.D.   On: 07/02/2018 10:58   STUDIES:   CULTURES: Blood 7/04 >> Sputum 7/04 >> oral flora  ANTIBIOTICS: Rocephin 7/04 >> Zithromax 7/04 >>   SIGNIFICANT EVENTS: 7/04 Admit, renal consulted 7/06 transfer to floor bed  LINES/TUBES: ETT 7/04 >> 7/05  ASSESSMENT / PLAN:  Acute hypoxic respiratory failure  from CAP with hemoptysis. 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 Secondary hyperparathyroidism. - HD per renal  Hx of HTN. - coreg  Hx of cocaine abuse. - f/u serum drug screen from 7/05  Anemia of chronic disease. - f/u CBC  ?hematemesis. - no further episodes - continue protonix  DVT prophylaxis - SCDs SUP - protonix Nutrition - renal diet Goals of care - full code  Chesley Mires, MD [place] 07/03/2018, 9:18 AM

## 2018-07-03 NOTE — Progress Notes (Signed)
[facility] Progress Note  Dialysis Orders: TTS, [facility], EDW 48 kg, 3 hours and 45 minutes, 180 dialyzer, BFR 400/DFR 600, 2K/2.0 calcium, heparin 2000 unit bolus, Aranesp 200 mcg weekly, calcitriol 0.75 mcg q. dialysis.  No UF profile, no sodium modeling.  Left brachiobasilic fistula  Assessment/Plan: 1.Ventilator Dependent Respiratory Failure: Presented with hypoxia/respiratory distress/hemoptysis and intubated after progression to respiratory failure from fatigue. NEt UF 3.3 L7/4, 3.6 L 7/6 - post wt 45.6 - below prior EDW  2. Hyperkalemia: Corrected with hemodialysis-reeducated regarding completing her full dialysis treatments as well as dietary potassium restriction. K 3.6  3. End-stage renal disease: Continue hemodialysis on a Tuesday/Thursday/Saturday schedule re-educated regarding compliance with attending and staying on full length of treatment. Next HD will be Tuesday - if all goes well tomorrow, she can be d/c and follow up with outpt HD on Tuesday 4. Hypertension:BP improved with ultrafiltration/hemodialysis. Below prior EDW -.  5. Anemia:Hemoglobin/hematocrit currently trending down (overt loss from hemoptysis noted), hgb 8.2 >  9.7 up post HD Sat 7/6)  6. Secondary hyperparathyroidism: Restart oral calcitriol with hemodialysis and resume phosphorus binders today. 7. Cocaine/tobacco abuse - uses and stays out on weekends; discussed implications of cocaine use - doesn't believe her 35 year old and 19 year or old mother know; declines NA, thinks she can stop cocaine - has talked with her spiritual leader  Will f/u at HD unit and ask SW there for additional support  Discussed need to stop smoking 8. Nutrition - alb 2.2 - discussed importance of eating  9. Multifocal PNA - persistent left pleural effusion - pulmonary following - afebrile - WBC low off O2 - abtx per pulmonary 10. Hx lupus - no meds at present  Myriam Jacobson, PA-C [facility] [phone] 07/03/2018,9:21 AM  LOS: 3 days   Subjective:   Feels like chest is tight when she breaths-   Objective Vitals:   07/02/18 1746 07/02/18 2034 07/03/18 0433 07/03/18 0549  BP: 113/82 109/73  123/79  Pulse: (!) 120 99  88  Resp: 18 16  18   Temp: 98.1 F (36.7 C) 98.8 F (37.1 C)  98.2 F (36.8 C)  TempSrc: Oral Oral  Oral  SpO2: 98% 95%  100%  Weight:  45.8 kg (100 lb 15.5 oz) 45.8 kg (100 lb 15.5 oz)   Height:       Physical Exam General:NAD looks good - more engaged than usual Heart: RRR Lungs: no rales/rhonchi , dim BS overall with poor expansion Abdomen: soft NT Extremities: no LE edema Dialysis Access:  Left upper AVF + bruit   Additional Objective Labs: Basic Metabolic Panel: Recent Labs  Lab 07/01/18 0400 07/02/18 0500 07/03/18 0709  NA 137 137 137  K 3.9 3.9 3.6  CL 95* 94* 96*  CO2 27 27 28   GLUCOSE 70 91 82  BUN 47* 68* 42*  CREATININE 8.89* 11.52* 7.57*  CALCIUM 8.2* 7.9* 9.0  PHOS 8.6*  --  6.8*   Liver Function Tests: Recent Labs  Lab 07/01/18 0400 07/03/18 0709  ALBUMIN 2.3* 2.2*   No results for input(s): LIPASE, AMYLASE in the last 168 hours. CBC: Recent Labs  Lab 06/30/18 0808  07/01/18 0400 07/02/18 0500 07/03/18 0709  WBC 6.2  --  6.4 5.0 3.6*  NEUTROABS 4.9  --   --   --   --   HGB 10.1*   < > 9.2* 8.2* 9.7*  HCT 35.2*   < >  31.3* 27.8* 33.8*  MCV 96.2  --  96.0 92.7 94.7  PLT 225  --  219 198 166   < > = values in this interval not displayed.   Blood Culture    Component Value Date/Time   SDES BLOOD A-LINE DRAW 06/30/2018 1513   SPECREQUEST  06/30/2018 1513    BOTTLES DRAWN AEROBIC AND ANAEROBIC Blood Culture results may not be optimal due to an excessive volume of blood received in culture bottles   CULT  06/30/2018 1513    NO GROWTH 2 DAYS Performed at [facility], [place] [address], [place], [place] [address]    REPTSTATUS PENDING 06/30/2018 1513    Cardiac Enzymes: No  results for input(s): CKTOTAL, CKMB, CKMBINDEX, TROPONINI in the last 168 hours. CBG: Recent Labs  Lab 06/30/18 1324 07/01/18 0720 07/01/18 0724 07/01/18 1104 07/01/18 1105  GLUCAP 83 69* 74 66* 71   Iron Studies: No results for input(s): IRON, TIBC, TRANSFERRIN, FERRITIN in the last 72 hours. Lab Results  Component Value Date   INR 1.14 06/30/2018   INR 1.25 09/26/2015   INR 1.14 01/30/2014   Studies/Results: Dg Chest Port 1 View  Result Date: 07/02/2018 CLINICAL DATA:  Respiratory distress EXAM: PORTABLE CHEST 1 VIEW COMPARISON:  Study obtained earlier in the day FINDINGS: There is patchy infiltrate in both upper lobes, more on the left than on the right and in the right base region. There is a fairly small left pleural effusion with patchy consolidation in the left lower lobe with associated atelectasis. Heart is enlarged with pulmonary vascularity within normal limits. No adenopathy. No bone lesions. IMPRESSION: Multifocal pneumonia. Persistent left pleural effusion. Stable cardiomegaly. No appreciable new opacity. Electronically Signed   By: Lowella Grip III M.D.   On: 07/02/2018 10:58   Dg Chest Port 1 View  Result Date: 07/02/2018 CLINICAL DATA:  Acute respiratory distress. EXAM: PORTABLE CHEST 1 VIEW COMPARISON:  06/30/2018. FINDINGS: The heart is enlarged. BILATERAL pulmonary opacities are improved, either clearing edema or pneumonia. Endotracheal tube has been removed. Retrocardiac opacity persists. IMPRESSION: Improved aeration.  Extubated. Electronically Signed   By: Staci Righter M.D.   On: 07/02/2018 07:00   Medications: . sodium chloride    . sodium chloride    . sodium chloride 250 mL (07/02/18 1451)  . azithromycin 500 mg (07/02/18 1455)  . calcium chloride  IV    . cefTRIAXone (ROCEPHIN)  IV 1 g (07/02/18 1628)   . calcitRIOL  0.75 mcg Oral Q T,Th,Sa-HD  . calcium acetate  2,001 mg Oral TID WC  . carvedilol  6.25 mg Oral BID WC  . Chlorhexidine Gluconate  Cloth  6 each Topical Q0600  . darbepoetin (ARANESP) injection - DIALYSIS  60 mcg Intravenous Q Sat-HD  . mouth rinse  15 mL Mouth Rinse BID  . pantoprazole  40 mg Oral Q1200

## 2018-07-04 DIAGNOSIS — R0603 Acute respiratory distress: Secondary | ICD-10-CM

## 2018-07-04 MED ORDER — ISOSORBIDE MONONITRATE ER 30 MG PO TB24
30.0000 mg | ORAL_TABLET | Freq: Every day | ORAL | 2 refills | Status: DC
Start: 2018-07-04 — End: 2018-07-19

## 2018-07-04 MED ORDER — ALPRAZOLAM 0.5 MG PO TABS: 0.2500 mg | ORAL_TABLET | Freq: Every evening | ORAL | 0 refills | Status: DC | PRN

## 2018-07-04 MED ORDER — HYDROXYCHLOROQUINE SULFATE 200 MG PO TABS: 400.0000 mg | ORAL_TABLET | Freq: Every day | ORAL | 2 refills | Status: DC

## 2018-07-04 MED ORDER — HYDRALAZINE HCL 25 MG PO TABS: 25.0000 mg | ORAL_TABLET | Freq: Three times a day (TID) | ORAL | 2 refills | Status: DC

## 2018-07-04 MED ORDER — AZITHROMYCIN 250 MG PO TABS: ORAL_TABLET | ORAL | 0 refills | Status: AC

## 2018-07-04 NOTE — Discharge Summary (Signed)
Physician Discharge Summary  Patient ID: [name] MRN: [id number] DOB/AGE: [birth year] 37 y.o.  Admit date: 06/30/2018 Discharge date: 07/04/2018                           DISCHARGE PLAN BY DIAGNOSIS    CAP. Acute hypoxic respiratory failure - resolved. Hemoptysis - due to cocaine.  Resolved. Plan: Continue azithromycin (PO tabs prescribed x 4 days following d/c). Continue bronchial hygiene. Polysubstance abuse counseling.  Lupus nephritis with ESRD (T/Th/Sat) - has hx of non-compliance. Plan: F/u with nephrology as outpatient for routine HD (nephrology has stressed importance of sticking to and completing her scheduled treatments). Plaquenil re-prescribed (pt states she has been out for a while).  Hx HTN. Plan: Continue preadmission carvedilol. Imdur and Hydralazine re-prescribed (pt states she has been out for a while). F/u with PCP and nephrology.  Anemia of chronic disease. Plan: F/u with nephrology.  Anxiety - pt eager to go home though anxious still especially about quitting drugs.  Asking for a short course of xanax (has been taking while admitted). Plan: 10 pills 0.59m xanax prescribed.                DISCHARGE SUMMARY   [name] THUMANis a 37y.o. y/o female with a PMH of end-stage renal disease (T/Th/Sat) secondary to lupus nephritis who is on chronic dialysis.  She presented to the emergency room 06/30/18 with hemoptysis and extreme dyspnea.  Prior to the hemoptysis she was not having fevers chills sweats or purulent cough.  In the emergency room she was in extremis sitting in the tripod position with a saturation in the 70s, respiratory rate in the 495Asystolic blood pressure in excess of 200.  She was intermittently coughing up bright red blood.  When queried it was clear to her that she had not been having nosebleeds and she was not merely coughing up blood that she had had drain into the posterior airway.  It was equally clear that initially she had not been  vomiting blood.  Unfortunately further history could not be obtained as the patient required emergent intubation.  She had missed at least 2 dialysis sessions the week of admission.  She was admitted and treated for CAP.  It was presumed that hemoptysis was due to drug use (UDS positive for cocaine).  She tolerated SBT and was successfully liberated from the ventilator on 07/01/18.  Post extubation, she continued to require supplemental O2 to maintain SpO2 > 90%.  This improved 7/6 and she was transferred out of the ICU.  During her admission, she was seen by nephrology and underwent usual HD sessions per her routine schedule.  On 7/7 she had continued improvement on the floor and tolerated ambulating in the hallways well.  On 7/8, she was deemed medically stable and cleared for discharge home.  She will be discharged home on 4 days of PO azithromycin.              SIGNIFICANT DIAGNOSTIC STUDIES CXR 7/4 > b/l infiltrates.  SIGNIFICANT EVENTS 7/4 > admit, intubated. 7/5 > extubated. 7/6 > transfer to floor. 7/8 > discharge.  MICRO DATA  Blood 7/4 >  Sputum 7/4 > negative.  ANTIBIOTICS Rocephin 7/4 > 7/8 Zithromax 7/4 > 7/8 Azithromycin PO > 7/8 (4 days prescribed).  CONSULTS Nephrology.  TUBES / LINES ETT 7/4 > 7/5. Arterial line 7/4 > 7/5.   Discharge Exam: General: Adult female, resting in bed,  in NAD.  Eager to go home. Neuro: A&O x 3, non-focal.  HEENT: [place]/AT. EOMI, sclerae anicteric. Cardiovascular: RRR, no M/R/G.  Lungs: Respirations even and unlabored.  CTA bilaterally, No W/R/R. Abdomen: BS x 4, soft, NT/ND.  Musculoskeletal: No gross deformities, no edema.  Skin: Dry, warm, no rashes.   Vitals:   07/03/18 1152 07/03/18 1716 07/03/18 2037 07/04/18 0542  BP:  129/79 (!) 138/101 (!) 140/100  Pulse: 90 89 87 87  Resp:  _0 Temp:  97.9 F (36.6 C) (!) 97.5 F (36.4 C) 98.1 F (36.7 C)  TempSrc:  Oral Oral Oral  SpO2: 100% 100% 99% 99%  Weight:       Height:         Discharge Labs  BMET Recent Labs  Lab 06/30/18 0808 06/30/18 0813 07/01/18 0400 07/02/18 0500 07/03/18 0709  NA 139 135 137 137 137  K 7.0* 6.9* 3.9 3.9 3.6  CL 100 105 95* 94* 96*  CO2 16*  --  _1 GLUCOSE 75 75 70 91 82  BUN 141* >140* 47* 68* 42*  CREATININE 16.34* 17.50* 8.89* 11.52* 7.57*  CALCIUM 9.2  --  8.2* 7.9* 9.0  PHOS  --   --  8.6*  --  6.8*    CBC Recent Labs  Lab 07/01/18 0400 07/02/18 0500 07/03/18 0709  HGB 9.2* 8.2* 9.7*  HCT 31.3* 27.8* 33.8*  WBC 6.4 5.0 3.6*  PLT 219 198 166    Anti-Coagulation Recent Labs  Lab 06/30/18 1011  INR 1.14    Discharge Instructions    Diet - low sodium heart healthy   Complete by:  As directed    Increase activity slowly   Complete by:  As directed        Follow-up Information    [place] Call.   Why:  Please call to schedule an appointment to establish a primary care physician Contact information: [place] [phone]           Allergies as of 07/04/2018      Reactions   Tobramycin Sulfate Swelling      Medication List    TAKE these medications   albuterol 108 (90 Base) MCG/ACT inhaler Commonly known as:  PROVENTIL HFA;VENTOLIN HFA Inhale 1-2 puffs into the lungs every 6 (six) hours as needed for wheezing or shortness of breath.   ALPRAZolam 0.5 MG tablet Commonly known as:  XANAX Take 0.5 tablets (0.25 mg total) by mouth at bedtime as needed for anxiety.   azithromycin 250 MG tablet Commonly known as:  ZITHROMAX Z-PAK Take 1 tablet daily x 4 days   calcium acetate 667 MG capsule Commonly known as:  PHOSLO Take 2,001-4,002 mg by mouth See admin instructions. 4,002 mg three times a day with each meal and 2,001 mg with each snack   carvedilol 3.125 MG tablet Commonly known as:  COREG Take 1 tablet (3.125 mg total) by mouth 2 (two) times daily with a meal.   darbepoetin 60  MCG/0.3ML Soln injection Commonly known as:  ARANESP Inject 0.3 mLs (60 mcg total) into the vein every Wednesday with hemodialysis. What changed:  when to take this   diphenhydrAMINE 50 MG tablet Commonly known as:  BENADRYL Take 50 mg by mouth every 6 (six) hours as needed for itching.   feeding supplement (NEPRO CARB STEADY) Liqd Take 237 mLs by mouth See admin instructions. At every dialysis  treatment on Tues/Thurs/Sat   ferric gluconate 62.5 mg in sodium chloride 0.9 % 100 mL Inject 62.5 mg into the vein every Thursday with hemodialysis. What changed:  when to take this   hydrALAZINE 25 MG tablet Commonly known as:  APRESOLINE Take 1 tablet (25 mg total) by mouth 3 (three) times daily.   hydroxychloroquine 200 MG tablet Commonly known as:  PLAQUENIL Take 2 tablets (400 mg total) by mouth daily.   isosorbide mononitrate 30 MG 24 hr tablet Commonly known as:  IMDUR Take 1 tablet (30 mg total) by mouth daily.   multivitamin Tabs tablet Take 1 tablet by mouth daily.        Disposition: Home.  Discharged Condition: [name] has met maximum benefit of inpatient care and is medically stable and cleared for discharge.  Patient is pending follow up as above.      Time spent on disposition:  Greater than 35 minutes.   Montey Hora, [facility] Pgr: [phone]  or [phone]

## 2018-07-04 NOTE — Progress Notes (Signed)
Patient discharged to home. Patient AVS reviewed and signed. Patient capable re-verbalizing medications and follow-up appointments. IV removed. Patient belongings sent with patient. Patient educated to return to the ED in the event of SOB, chest pain or dizziness.   Addisson Frate B. RN 

## 2018-07-04 NOTE — Progress Notes (Signed)
Patient informed RN early this am that she was going to "walk the hall like her doctor told her to do so she could go home today." I informed patient not to leave unit hallway. Patient waited till I left the desk and she left the floor. Patient was gone for approximately 30 minutes. Patient was told upon return not to leave again. Patient stated she was wanting more fluids (already over her limit for 24 hrs) and something to eat. Patient left floor again close to shift change and came back with plastic bag (contents unknown).

## 2018-07-05 LAB — CULTURE, BLOOD (ROUTINE X 2)
CULTURE: NO GROWTH
Culture: NO GROWTH
Special Requests: ADEQUATE

## 2018-07-06 LAB — THC,MS,WB/SP RFX
CANNABINOID CONFIRMATION: POSITIVE
CANNABINOL: NEGATIVE ng/mL
CARBOXY-THC: 5.9 ng/mL
Cannabidiol: NEGATIVE ng/mL
Hydroxy-THC: NEGATIVE ng/mL
Tetrahydrocannabinol(THC): NEGATIVE ng/mL

## 2018-07-08 LAB — OPIATES,MS,WB/SP RFX
6-ACETYLMORPHINE: NEGATIVE
Codeine: NEGATIVE ng/mL
DIHYDROCODEINE: NEGATIVE ng/mL
Hydrocodone: NEGATIVE ng/mL
Hydromorphone: NEGATIVE ng/mL
MORPHINE: NEGATIVE ng/mL
Opiate Confirmation: NEGATIVE

## 2018-07-13 ENCOUNTER — Encounter: Payer: Medicaid Other | Admitting: Vascular Surgery

## 2018-07-14 ENCOUNTER — Encounter (HOSPITAL_COMMUNITY): Payer: Self-pay | Admitting: *Deleted

## 2018-07-14 ENCOUNTER — Emergency Department (HOSPITAL_COMMUNITY)
Admission: EM | Admit: 2018-07-14 | Discharge: 2018-07-14 | Disposition: A | Discharge status: Home / Self Care | Payer: Medicaid Other | Attending: Emergency Medicine | Admitting: Emergency Medicine

## 2018-07-14 ENCOUNTER — Other Ambulatory Visit: Payer: Self-pay

## 2018-07-14 DIAGNOSIS — I5022 Chronic systolic (congestive) heart failure: Secondary | ICD-10-CM | POA: Insufficient documentation

## 2018-07-14 DIAGNOSIS — N186 End stage renal disease: Secondary | ICD-10-CM | POA: Diagnosis not present

## 2018-07-14 DIAGNOSIS — R002 Palpitations: Secondary | ICD-10-CM

## 2018-07-14 DIAGNOSIS — I471 Supraventricular tachycardia: Secondary | ICD-10-CM | POA: Diagnosis not present

## 2018-07-14 DIAGNOSIS — Z992 Dependence on renal dialysis: Secondary | ICD-10-CM

## 2018-07-14 DIAGNOSIS — Z79899 Other long term (current) drug therapy: Secondary | ICD-10-CM | POA: Diagnosis not present

## 2018-07-14 DIAGNOSIS — F17210 Nicotine dependence, cigarettes, uncomplicated: Secondary | ICD-10-CM | POA: Diagnosis not present

## 2018-07-14 DIAGNOSIS — R000 Tachycardia, unspecified: Secondary | ICD-10-CM | POA: Diagnosis present

## 2018-07-14 DIAGNOSIS — I132 Hypertensive heart and chronic kidney disease with heart failure and with stage 5 chronic kidney disease, or end stage renal disease: Secondary | ICD-10-CM | POA: Diagnosis not present

## 2018-07-14 LAB — BASIC METABOLIC PANEL
ANION GAP: 13 (ref 5–15)
BUN: 55 mg/dL — ABNORMAL HIGH (ref 6–20)
CALCIUM: 8.3 mg/dL — AB (ref 8.9–10.3)
CO2: 26 mmol/L (ref 22–32)
Chloride: 100 mmol/L (ref 98–111)
Creatinine, Ser: 9.01 mg/dL — ABNORMAL HIGH (ref 0.44–1.00)
GFR calc Af Amer: 6 mL/min — ABNORMAL LOW (ref >60)
GFR, EST NON AFRICAN AMERICAN: 5 mL/min — AB (ref >60)
Glucose, Bld: 83 mg/dL (ref 70–99)
Potassium: 4.7 mmol/L (ref 3.5–5.1)
SODIUM: 139 mmol/L (ref 135–145)

## 2018-07-14 LAB — CBC
HCT: 32.8 % — ABNORMAL LOW (ref 36.0–46.0)
Hemoglobin: 9.5 g/dL — ABNORMAL LOW (ref 12.0–15.0)
MCH: 28.5 pg (ref 26.0–34.0)
MCHC: 29 g/dL — ABNORMAL LOW (ref 30.0–36.0)
MCV: 98.5 fL (ref 78.0–100.0)
PLATELETS: 303 10*3/uL (ref 150–400)
RBC: 3.33 MIL/uL — AB (ref 3.87–5.11)
RDW: 23.4 % — AB (ref 11.5–15.5)
WBC: 5.4 10*3/uL (ref 4.0–10.5)

## 2018-07-14 LAB — COCAINE,MS,WB/SP RFX
Benzoylecgonine: >1500 ng/mL
COCAINE CONFIRMATION: POSITIVE
Cocaine: NEGATIVE ng/mL

## 2018-07-14 LAB — DRUG SCREEN 10 W/CONF, SERUM
Amphetamines, IA: NEGATIVE ng/mL
Barbiturates, IA: NEGATIVE ug/mL
Benzodiazepines, IA: NEGATIVE ng/mL
COCAINE & METABOLITE, IA: POSITIVE ng/mL
METHADONE, IA: NEGATIVE ng/mL
OXYCODONES, IA: NEGATIVE ng/mL
Opiates, IA: NEGATIVE ng/mL
PHENCYCLIDINE, IA: NEGATIVE ng/mL
Propoxyphene, IA: NEGATIVE ng/mL
THC(MARIJUANA) METABOLITE, IA: POSITIVE ng/mL

## 2018-07-14 NOTE — ED Provider Notes (Signed)
[facility] EMERGENCY DEPARTMENT Provider Note   CSN: [id number] Arrival date & time: 07/14/18  1027     History   Chief Complaint Chief Complaint  Patient presents with  . Tachycardia    HPI [name] is a 37 y.o. female.  Patient with hx svt/afib, esrd/hd, c/o palpitations/svt earlier today approximately 30 minutes into her dialysis session. Symptoms acute onset, moderate, persistent, spontaneously resolved. States felt fine, at baseline, prior to onset palpitations. No chest pain or discomfort. No syncope. No fever or chills. Has been eating and drinking normally. Compliant w home meds. After several minutes, had resolution of tachycardia.   The history is provided by the patient and the EMS personnel.    Past Medical History:  Diagnosis Date  . Anemia    low iron - receives iron at dialysis  . Chronic systolic congestive heart failure ([place]) 03/16/2016  . Dyspnea   . ESRD (end stage renal disease) ([place])    Hemo TTHSAT _ [place]  . H/O pericarditis 01/17/2013  . H/O pleural effusion 01/17/2013  . Heart murmur   . Lupus (systemic lupus erythematosus) (HCC)    Previously followed with Dr. Charlestine Night, has not followed up recently  . 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 Pneumonia    many times  . Polysubstance abuse ([place])    cocaine, MJ, tobacco  . S/P pericardiocentesis 01/17/2013   H/o pericardial effusion with tamponade 2006   . Seizures ([place])    during pregnancy 1 time    Patient Active Problem List   Diagnosis Date Noted  . Hemoptysis 06/30/2018  . Acute respiratory distress   . ESRD (end stage renal disease) on dialysis ([place]) 02/17/2017  . SOB (shortness of breath) 02/16/2017  . Chronic systolic congestive heart failure ([place]) 03/16/2016  . Cough   . Ascites   . Follow up   . Other  hypervolemia   . S/P thoracentesis   . Abdominal pain   . Pleural effusion   . Cough with hemoptysis   . Myalgia   . Pulmonary edema   . Crack cocaine overdose ([place])   . ESRD (end stage renal disease) ([place]) 03/03/2016  . Hypertensive emergency 03/03/2016  . Acute bronchitis 02/03/2016  . Volume overload 11/16/2015  . Shortness of breath 11/16/2015  . Hyperkalemia 09/25/2015  . Rash and nonspecific skin eruption 06/26/2015  . Secondary Raynaud's phenomenon 06/24/2015  . Cramping of hands 04/19/2014  . Unspecified contraceptive management 04/19/2014  . End stage renal disease ([place]) 03/27/2014  . Insomnia 03/15/2014  . Benign hypertension with ESRD (end-stage renal disease) ([place]) 03/15/2014  . Tobacco abuse 02/15/2014  . Healthcare maintenance 02/15/2014  . Hypoalbuminemia 02/01/2014  . Nephrotic syndrome 02/01/2014  . ESRD on dialysis ([place]) 01/31/2014  . Pleural effusion, left 01/31/2014  . Metabolic acidosis, increased anion gap (IAG) 01/31/2014  . Microcytic anemia 01/29/2014  . Hypocalcemia 01/29/2014  . Streptococcal bacteremia 01/23/2013  . Cocaine abuse ([place]) 01/18/2013  . Marijuana smoker ([place]) 01/18/2013  . H/O pericarditis 01/17/2013  . SLE (systemic lupus erythematosus) ([place]) 01/17/2013  . Lupus nephritis ([place]) 01/17/2013  . S/P pericardiocentesis 01/17/2013  . H/O pleural effusion 01/17/2013  . Nephrosis 01/17/2013  . Trichomoniasis 01/17/2013  . Preseptal cellulitis 01/17/2013    Past Surgical History:  Procedure Laterality Date  . AV FISTULA PLACEMENT    . BASCILIC  VEIN TRANSPOSITION Left 02/05/2014   Procedure: [place];  Surgeon: [name], MD;  Location: [facility];  Service: Vascular;  Laterality: Left;  . FISTULA SUPERFICIALIZATION Left 05/30/2018   Procedure: FISTULA PLICATION BASILIC VEIN TRANSPOSITION;  Surgeon: Angelia Mould, MD;  Location: [facility];  Service: Vascular;  Laterality: Left;  Marland Kitchen VENOGRAM Right 01/31/2014   Procedure:  DIALYSIS CATHETER;  Surgeon: Serafina Mitchell, MD;  Location[facility] CATH LAB;  Service: Cardiovascular;  Laterality: Right;     OB History   None      Home Medications    Prior to Admission medications   Medication Sig Start Date End Date Taking? Authorizing Provider  albuterol (PROVENTIL HFA;VENTOLIN HFA) 108 (90 Base) MCG/ACT inhaler Inhale 1-2 puffs into the lungs every 6 (six) hours as needed for wheezing or shortness of breath. Patient not taking: Reported on 07/01/2018 02/03/16   Lucious Groves, DO  ALPRAZolam Duanne Moron) 0.5 MG tablet Take 0.5 tablets (0.25 mg total) by mouth at bedtime as needed for anxiety. 07/04/18   Desai, Rahul P, PA-C  calcium acetate (PHOSLO) 667 MG capsule Take 2,001-4,002 mg by mouth See admin instructions. 4,002 mg three times a day with each meal and 2,001 mg with each snack    [provider]  carvedilol (COREG) 3.125 MG tablet Take 1 tablet (3.125 mg total) by mouth 2 (two) times daily with a meal. 05/21/18   Cardama, Grayce Sessions, MD  darbepoetin (ARANESP) 60 MCG/0.3ML SOLN injection Inject 0.3 mLs (60 mcg total) into the vein every Wednesday with hemodialysis. Patient taking differently: Inject 60 mcg into the vein every 7 (seven) days.  02/08/14   Jessee Avers, MD  diphenhydrAMINE (BENADRYL) 50 MG tablet Take 50 mg by mouth every 6 (six) hours as needed for itching.    [provider]  ferric gluconate 62.5 mg in sodium chloride 0.9 % 100 mL Inject 62.5 mg into the vein every Thursday with hemodialysis. Patient taking differently: Inject 62.5 mg into the vein every Saturday with hemodialysis.  03/09/16   Rice, Resa Miner, MD  hydrALAZINE (APRESOLINE) 25 MG tablet Take 1 tablet (25 mg total) by mouth 3 (three) times daily. 07/04/18   Desai, Rahul P, PA-C  hydroxychloroquine (PLAQUENIL) 200 MG tablet Take 2 tablets (400 mg total) by mouth daily. 07/04/18   Desai, Rahul P, PA-C  isosorbide mononitrate (IMDUR) 30 MG 24 hr tablet Take 1 tablet (30 mg  total) by mouth daily. 07/04/18   Desai, Rahul P, PA-C  multivitamin (RENA-VIT) TABS tablet Take 1 tablet by mouth daily. 02/18/17   Holley Raring, MD  Nutritional Supplements (FEEDING SUPPLEMENT, NEPRO CARB STEADY,) LIQD Take 237 mLs by mouth See admin instructions. At every dialysis treatment on Tues/Thurs/Sat    [provider]    Family History No family history on file.  Social History Social History   Tobacco Use  . Smoking status: Current Every Day Smoker    Packs/day: 0.12    Years: 15.00    Pack years: 1.80    Types: Cigarettes  . Smokeless tobacco: Never Used  . Tobacco comment: quit smoking s/p hospital discharge/ SMOKES ABOUT 2-3 CIGARETTES A DAY  Substance Use Topics  . Alcohol use: Yes    Alcohol/week: 0.0 oz    Comment: Special Occasional  . Drug use: Yes    Frequency: 5.0 times per week    Types: Marijuana, Cocaine    Comment: Marijuana last time mid May 2019, Cocaine- last tile mid  April 2019     Allergies   Tobramycin sulfate   Review of Systems Review of Systems  Constitutional: Negative for chills and fever.  HENT: Negative for sore throat.   Eyes: Negative for redness.  Respiratory: Negative for shortness of breath.   Cardiovascular: Positive for palpitations. Negative for chest pain and leg swelling.  Gastrointestinal: Negative for abdominal pain, diarrhea and vomiting.  Genitourinary: Negative for flank pain.  Musculoskeletal: Negative for back pain and neck pain.  Skin: Negative for rash.  Neurological: Negative for headaches.  Hematological: Does not bruise/bleed easily.  Psychiatric/Behavioral: Negative for confusion.     Physical Exam Updated Vital Signs BP 128/60 (BP Location: Right Arm)   Pulse 88   Temp 98 F (36.7 C) (Oral)   Resp 16   Ht 1.626 m (5\' 4" )   Wt 53 kg (116 lb 13.5 oz)   SpO2 97%   BMI 20.06 kg/m   Physical Exam  Constitutional: She appears well-developed and well-nourished.  HENT:  Mouth/Throat:  Oropharynx is clear and moist.  Eyes: Conjunctivae are normal. No scleral icterus.  Neck: Neck supple. No tracheal deviation present. No thyromegaly present.  Cardiovascular: Normal rate, regular rhythm, normal heart sounds and intact distal pulses.  Pulmonary/Chest: Effort normal and breath sounds normal. No respiratory distress.  Abdominal: Soft. Normal appearance. She exhibits no distension. There is no tenderness.  Musculoskeletal: She exhibits no edema.  Neurological: She is alert.  Skin: Skin is warm and dry. No rash noted. She is not diaphoretic.  Psychiatric: She has a normal mood and affect.  Nursing note and vitals reviewed.    ED Treatments / Results  Labs (all labs ordered are listed, but only abnormal results are displayed) Results for orders placed or performed during the hospital encounter of 07/14/18  CBC  Result Value Ref Range   WBC 5.4 4.0 - 10.5 K/uL   RBC 3.33 (L) 3.87 - 5.11 MIL/uL   Hemoglobin 9.5 (L) 12.0 - 15.0 g/dL   HCT 32.8 (L) 36.0 - 46.0 %   MCV 98.5 78.0 - 100.0 fL   MCH 28.5 26.0 - 34.0 pg   MCHC 29.0 (L) 30.0 - 36.0 g/dL   RDW 23.4 (H) 11.5 - 15.5 %   Platelets 303 150 - 400 K/uL  Basic metabolic panel  Result Value Ref Range   Sodium 139 135 - 145 mmol/L   Potassium 4.7 3.5 - 5.1 mmol/L   Chloride 100 98 - 111 mmol/L   CO2 26 22 - 32 mmol/L   Glucose, Bld 83 70 - 99 mg/dL   BUN 55 (H) 6 - 20 mg/dL   Creatinine, Ser 9.01 (H) 0.44 - 1.00 mg/dL   Calcium 8.3 (L) 8.9 - 10.3 mg/dL   GFR calc non Af Amer 5 (L) >60 mL/min   GFR calc Af Amer 6 (L) >60 mL/min   Anion gap 13 5 - 15     EKG EKG Interpretation  Date/Time:  Thursday July 14 2018 09:12:52 EDT Ventricular Rate:  88 PR Interval:    QRS Duration: 92 QT Interval:  422 QTC Calculation: 511 R Axis:   79 Text Interpretation:  Sinus rhythm Left atrial enlargement Probable left ventricular hypertrophy Prolonged QT interval No significant change since last tracing Confirmed by Lajean Saver [phone] on 07/14/2018 9:22:54 AM   Radiology No results found.  Procedures Procedures (including critical care time)  Medications Ordered in ED Medications - No data to display   Initial Impression / Assessment  and Plan / ED Course  I have reviewed the triage vital signs and the nursing notes.  Pertinent labs & imaging results that were available during my care of the patient were reviewed by me and considered in my medical decision making (see chart for details).  Continuous pulse ox and monitor. o2 [place]. Labs sent. Ecg.  Reviewed nursing notes and prior charts for additional history.   Recheck patient - remains in nsr.   Labs reviewed - c/w baseline, k normal.  No sob or increased wob. Pulse ox 97%.  Had partial hd today.   Remains in nsr, and is asymptomatic.   Pt currently appears stable for d/c .    Final Clinical Impressions(s) / ED Diagnoses   Final diagnoses:  None    ED Discharge Orders    None       Lajean Saver, MD 07/14/18 1041

## 2018-07-14 NOTE — ED Triage Notes (Signed)
Patient presents to ED c/o SVT while on dialysis today.states she had only had 45 mins of dialysis, states she normally gets SVT around 4 am, Patient converted on her own. , upon arrival to ed patient is alert oriented , had 1 episode of svt here was able to convert self with vagal

## 2018-07-14 NOTE — Discharge Instructions (Addendum)
It was our pleasure to provide your ER care today - we hope that you feel better.  Follow up with your doctor/cardiologist in the coming week.  Return to ER if worse, new symptoms, persistent fast heart beat, chest pain, trouble breathing, other concern.

## 2018-07-16 ENCOUNTER — Other Ambulatory Visit: Payer: Self-pay

## 2018-07-16 ENCOUNTER — Encounter (HOSPITAL_COMMUNITY): Payer: Self-pay | Admitting: Internal Medicine

## 2018-07-16 ENCOUNTER — Inpatient Hospital Stay (HOSPITAL_COMMUNITY)
Admission: EM | Admit: 2018-07-16 | Discharge: 2018-07-19 | DRG: 308 | Disposition: A | Discharge status: Home / Self Care | Payer: Medicaid Other | Attending: Internal Medicine | Admitting: Internal Medicine

## 2018-07-16 ENCOUNTER — Emergency Department (HOSPITAL_COMMUNITY): Payer: Medicaid Other

## 2018-07-16 DIAGNOSIS — I5040 Unspecified combined systolic (congestive) and diastolic (congestive) heart failure: Secondary | ICD-10-CM

## 2018-07-16 DIAGNOSIS — R0601 Orthopnea: Secondary | ICD-10-CM | POA: Diagnosis not present

## 2018-07-16 DIAGNOSIS — Z79899 Other long term (current) drug therapy: Secondary | ICD-10-CM

## 2018-07-16 DIAGNOSIS — R0602 Shortness of breath: Secondary | ICD-10-CM | POA: Diagnosis present

## 2018-07-16 DIAGNOSIS — E8770 Fluid overload, unspecified: Secondary | ICD-10-CM | POA: Diagnosis not present

## 2018-07-16 DIAGNOSIS — N186 End stage renal disease: Secondary | ICD-10-CM

## 2018-07-16 DIAGNOSIS — J811 Chronic pulmonary edema: Secondary | ICD-10-CM | POA: Diagnosis not present

## 2018-07-16 DIAGNOSIS — I5022 Chronic systolic (congestive) heart failure: Secondary | ICD-10-CM | POA: Diagnosis present

## 2018-07-16 DIAGNOSIS — K0889 Other specified disorders of teeth and supporting structures: Secondary | ICD-10-CM

## 2018-07-16 DIAGNOSIS — Z992 Dependence on renal dialysis: Secondary | ICD-10-CM

## 2018-07-16 DIAGNOSIS — Z881 Allergy status to other antibiotic agents status: Secondary | ICD-10-CM

## 2018-07-16 DIAGNOSIS — M329 Systemic lupus erythematosus, unspecified: Secondary | ICD-10-CM | POA: Diagnosis present

## 2018-07-16 DIAGNOSIS — Z8701 Personal history of pneumonia (recurrent): Secondary | ICD-10-CM | POA: Diagnosis not present

## 2018-07-16 DIAGNOSIS — I471 Supraventricular tachycardia, unspecified: Secondary | ICD-10-CM | POA: Diagnosis present

## 2018-07-16 DIAGNOSIS — I132 Hypertensive heart and chronic kidney disease with heart failure and with stage 5 chronic kidney disease, or end stage renal disease: Secondary | ICD-10-CM

## 2018-07-16 DIAGNOSIS — M3214 Glomerular disease in systemic lupus erythematosus: Secondary | ICD-10-CM

## 2018-07-16 DIAGNOSIS — E875 Hyperkalemia: Secondary | ICD-10-CM | POA: Diagnosis not present

## 2018-07-16 DIAGNOSIS — Z7951 Long term (current) use of inhaled steroids: Secondary | ICD-10-CM

## 2018-07-16 HISTORY — DX: Bacteremia: R78.81

## 2018-07-16 HISTORY — DX: Unspecified streptococcus as the cause of diseases classified elsewhere: B95.5

## 2018-07-16 LAB — BASIC METABOLIC PANEL
ANION GAP: 14 (ref 5–15)
BUN: 62 mg/dL — ABNORMAL HIGH (ref 6–20)
CO2: 27 mmol/L (ref 22–32)
Calcium: 8.1 mg/dL — ABNORMAL LOW (ref 8.9–10.3)
Chloride: 99 mmol/L (ref 98–111)
Creatinine, Ser: 9.97 mg/dL — ABNORMAL HIGH (ref 0.44–1.00)
GFR, EST AFRICAN AMERICAN: 5 mL/min — AB (ref >60)
GFR, EST NON AFRICAN AMERICAN: 5 mL/min — AB (ref >60)
GLUCOSE: 95 mg/dL (ref 70–99)
Potassium: 4.3 mmol/L (ref 3.5–5.1)
Sodium: 140 mmol/L (ref 135–145)

## 2018-07-16 LAB — CBC
HCT: 29 % — ABNORMAL LOW (ref 36.0–46.0)
HEMOGLOBIN: 8.6 g/dL — AB (ref 12.0–15.0)
MCH: 28.9 pg (ref 26.0–34.0)
MCHC: 29.7 g/dL — ABNORMAL LOW (ref 30.0–36.0)
MCV: 97.3 fL (ref 78.0–100.0)
Platelets: 269 10*3/uL (ref 150–400)
RBC: 2.98 MIL/uL — AB (ref 3.87–5.11)
RDW: 22.5 % — ABNORMAL HIGH (ref 11.5–15.5)
WBC: 4.8 10*3/uL (ref 4.0–10.5)

## 2018-07-16 MED ORDER — RENA-VITE PO TABS
1.0000 | ORAL_TABLET | Freq: Every day | ORAL | Status: DC
  Administered 2018-07-16 – 2018-07-18 (×3): 1 via ORAL
  Filled 2018-07-16 (×3): qty 1

## 2018-07-16 MED ORDER — ACETAMINOPHEN 650 MG RE SUPP: 650.0000 mg | Freq: Four times a day (QID) | RECTAL | Status: DC | PRN

## 2018-07-16 MED ORDER — CALCIUM ACETATE (PHOS BINDER) 667 MG PO CAPS: 2001.0000 mg | ORAL_CAPSULE | ORAL | Status: DC | PRN

## 2018-07-16 MED ORDER — SODIUM CHLORIDE 0.9 % IV SOLN: INTRAVENOUS | Status: DC | PRN

## 2018-07-16 MED ORDER — ONDANSETRON HCL 4 MG PO TABS
4.0000 mg | ORAL_TABLET | Freq: Three times a day (TID) | ORAL | Status: DC | PRN
  Administered 2018-07-16 – 2018-07-17 (×2): 4 mg via ORAL
  Filled 2018-07-16: qty 1

## 2018-07-16 MED ORDER — ISOSORBIDE MONONITRATE ER 30 MG PO TB24
30.0000 mg | ORAL_TABLET | Freq: Every day | ORAL | Status: DC
  Administered 2018-07-16: 30 mg via ORAL
  Filled 2018-07-16: qty 1

## 2018-07-16 MED ORDER — CALCIUM ACETATE (PHOS BINDER) 667 MG PO CAPS
4002.0000 mg | ORAL_CAPSULE | Freq: Three times a day (TID) | ORAL | Status: DC
Start: 2018-07-16 — End: 2018-07-19
  Administered 2018-07-16 – 2018-07-19 (×7): 4002 mg via ORAL
  Filled 2018-07-16 (×8): qty 6

## 2018-07-16 MED ORDER — POLYETHYLENE GLYCOL 3350 17 G PO PACK: 17.0000 g | PACK | Freq: Every day | ORAL | Status: DC | PRN

## 2018-07-16 MED ORDER — ACETAMINOPHEN 325 MG PO TABS
650.0000 mg | ORAL_TABLET | Freq: Four times a day (QID) | ORAL | Status: DC | PRN
  Administered 2018-07-18 – 2018-07-19 (×2): 650 mg via ORAL
  Filled 2018-07-16 (×2): qty 2

## 2018-07-16 MED ORDER — HEPARIN SODIUM (PORCINE) 5000 UNIT/ML IJ SOLN
5000.0000 [IU] | Freq: Three times a day (TID) | INTRAMUSCULAR | Status: DC
  Administered 2018-07-16 – 2018-07-18 (×6): 5000 [IU] via SUBCUTANEOUS
  Filled 2018-07-16 (×5): qty 1

## 2018-07-16 MED ORDER — CARVEDILOL 6.25 MG PO TABS
6.2500 mg | ORAL_TABLET | Freq: Two times a day (BID) | ORAL | Status: DC
  Administered 2018-07-16 – 2018-07-19 (×6): 6.25 mg via ORAL
  Filled 2018-07-16 (×6): qty 1

## 2018-07-16 MED ORDER — HYDROXYZINE HCL 10 MG PO TABS
10.0000 mg | ORAL_TABLET | Freq: Three times a day (TID) | ORAL | Status: DC | PRN
Start: 2018-07-16 — End: 2018-07-17
  Administered 2018-07-16 – 2018-07-17 (×2): 10 mg via ORAL
  Filled 2018-07-16 (×2): qty 1

## 2018-07-16 MED ORDER — CEFAZOLIN SODIUM-DEXTROSE 2-4 GM/100ML-% IV SOLN
INTRAVENOUS | Status: AC
  Filled 2018-07-16: qty 100

## 2018-07-16 MED ORDER — DILTIAZEM HCL 30 MG PO TABS
30.0000 mg | ORAL_TABLET | Freq: Once | ORAL | Status: AC
  Administered 2018-07-16: 30 mg via ORAL
  Filled 2018-07-16: qty 1

## 2018-07-16 MED ORDER — DILTIAZEM HCL 30 MG PO TABS: 30.0000 mg | ORAL_TABLET | Freq: Four times a day (QID) | ORAL | Status: DC | PRN

## 2018-07-16 NOTE — ED Notes (Signed)
Pt requesting something to eat, provider notified and stated that pt can eat. Pt given sandwich bag, sprite, and secretary notified to order renal diet meal tray.

## 2018-07-16 NOTE — ED Provider Notes (Signed)
[facility] EMERGENCY DEPARTMENT Provider Note   CSN: [id number] Arrival date & time: 07/16/18  1007     History   Chief Complaint Chief Complaint  Patient presents with  . SVT    HPI [name] is a 37 y.o. female.  HPI   Patient is a 37 year old female with a history of ESRD on dialysis Tuesday, Thursday, Saturday, SLE, heart failure with reduced ejection fraction, polysubstance use presenting for SVT.  Patient reports she has had SVT for the last 2 dialysis sessions, when she was 30 minutes then.  Patient reports she will feel her heart flutter, and when this happens at home, she is typically able to perform a Valsalva maneuver and will terminate the rhythm.  Patient reports that this is not working at dialysis.  Patient reports that earlier in spring 2019, she was diagnosed with atrial fibrillation, has not had cardiology follow-up and was recently admitted in July 2019 for hypoxic respiratory failure secondary to pneumonia.  Patient reports that she was short of breath when she had the SVT, but has resolved at present.  No chest pain.  Patient reports that she feels abdominal bloating, and some increase edema in bilateral thighs, but sees no lower extremity edema.  Past Medical History:  Diagnosis Date  . Anemia    low iron - receives iron at dialysis  . Chronic systolic congestive heart failure ([place]) 03/16/2016  . Dyspnea   . ESRD (end stage renal disease) ([place])    Hemo TTHSAT _ [place]  . H/O pericarditis 01/17/2013  . H/O pleural effusion 01/17/2013  . Heart murmur   . Lupus (systemic lupus erythematosus) (HCC)    Previously followed with Dr. Charlestine Night, has not followed up recently  . 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 Pneumonia    many times  . Polysubstance abuse ([place])    cocaine, MJ,  tobacco  . S/P pericardiocentesis 01/17/2013   H/o pericardial effusion with tamponade 2006   . Seizures ([place])    during pregnancy 1 time    Patient Active Problem List   Diagnosis Date Noted  . Hemoptysis 06/30/2018  . Acute respiratory distress   . ESRD (end stage renal disease) on dialysis ([place]) 02/17/2017  . SOB (shortness of breath) 02/16/2017  . Chronic systolic congestive heart failure ([place]) 03/16/2016  . Cough   . Ascites   . Follow up   . Other hypervolemia   . S/P thoracentesis   . Abdominal pain   . Pleural effusion   . Cough with hemoptysis   . Myalgia   . Pulmonary edema   . Crack cocaine overdose ([place])   . ESRD (end stage renal disease) ([place]) 03/03/2016  . Hypertensive emergency 03/03/2016  . Acute bronchitis 02/03/2016  . Volume overload 11/16/2015  . Shortness of breath 11/16/2015  . Hyperkalemia 09/25/2015  . Rash and nonspecific skin eruption 06/26/2015  . Secondary Raynaud's phenomenon 06/24/2015  . Cramping of hands 04/19/2014  . Unspecified contraceptive management 04/19/2014  . End stage renal disease ([place]) 03/27/2014  . Insomnia 03/15/2014  . Benign hypertension with ESRD (end-stage renal disease) ([place]) 03/15/2014  . Tobacco abuse 02/15/2014  . Healthcare maintenance 02/15/2014  . Hypoalbuminemia 02/01/2014  . Nephrotic syndrome 02/01/2014  . ESRD on dialysis ([place]) 01/31/2014  . Pleural effusion, left 01/31/2014  . Metabolic acidosis, increased anion  gap (IAG) 01/31/2014  . Microcytic anemia 01/29/2014  . Hypocalcemia 01/29/2014  . Streptococcal bacteremia 01/23/2013  . Cocaine abuse ([place]) 01/18/2013  . Marijuana smoker ([place]) 01/18/2013  . H/O pericarditis 01/17/2013  . SLE (systemic lupus erythematosus) ([place]) 01/17/2013  . Lupus nephritis ([place]) 01/17/2013  . S/P pericardiocentesis 01/17/2013  . H/O pleural effusion 01/17/2013  . Nephrosis 01/17/2013  . Trichomoniasis 01/17/2013  . Preseptal cellulitis 01/17/2013    Past Surgical  History:  Procedure Laterality Date  . AV FISTULA PLACEMENT    . BASCILIC VEIN TRANSPOSITION Left 02/05/2014   Procedure: [place];  Surgeon: [name], MD;  Location: [facility];  Service: Vascular;  Laterality: Left;  . FISTULA SUPERFICIALIZATION Left 05/30/2018   Procedure: FISTULA PLICATION BASILIC VEIN TRANSPOSITION;  Surgeon: Angelia Mould, MD;  Location: [facility];  Service: Vascular;  Laterality: Left;  Marland Kitchen VENOGRAM Right 01/31/2014   Procedure: DIALYSIS CATHETER;  Surgeon: Serafina Mitchell, MD;  Location[facility] CATH LAB;  Service: Cardiovascular;  Laterality: Right;     OB History   None      Home Medications    Prior to Admission medications   Medication Sig Start Date End Date Taking? Authorizing Provider  albuterol (PROVENTIL HFA;VENTOLIN HFA) 108 (90 Base) MCG/ACT inhaler Inhale 1-2 puffs into the lungs every 6 (six) hours as needed for wheezing or shortness of breath. Patient not taking: Reported on 07/01/2018 02/03/16   Lucious Groves, DO  ALPRAZolam Duanne Moron) 0.5 MG tablet Take 0.5 tablets (0.25 mg total) by mouth at bedtime as needed for anxiety. 07/04/18   Desai, Rahul P, PA-C  calcium acetate (PHOSLO) 667 MG capsule Take 2,001-4,002 mg by mouth See admin instructions. 4,002 mg three times a day with each meal and 2,001 mg with each snack    [provider]  carvedilol (COREG) 3.125 MG tablet Take 1 tablet (3.125 mg total) by mouth 2 (two) times daily with a meal. 05/21/18   Cardama, Grayce Sessions, MD  darbepoetin (ARANESP) 60 MCG/0.3ML SOLN injection Inject 0.3 mLs (60 mcg total) into the vein every Wednesday with hemodialysis. Patient taking differently: Inject 60 mcg into the vein every 7 (seven) days.  02/08/14   Jessee Avers, MD  diphenhydrAMINE (BENADRYL) 50 MG tablet Take 50 mg by mouth every 6 (six) hours as needed for itching.    [provider]  ferric gluconate 62.5 mg in sodium chloride 0.9 % 100 mL Inject 62.5 mg into the vein every  Thursday with hemodialysis. Patient taking differently: Inject 62.5 mg into the vein every Saturday with hemodialysis.  03/09/16   Rice, Resa Miner, MD  hydrALAZINE (APRESOLINE) 25 MG tablet Take 1 tablet (25 mg total) by mouth 3 (three) times daily. 07/04/18   Desai, Rahul P, PA-C  hydroxychloroquine (PLAQUENIL) 200 MG tablet Take 2 tablets (400 mg total) by mouth daily. 07/04/18   Desai, Rahul P, PA-C  isosorbide mononitrate (IMDUR) 30 MG 24 hr tablet Take 1 tablet (30 mg total) by mouth daily. 07/04/18   Desai, Rahul P, PA-C  multivitamin (RENA-VIT) TABS tablet Take 1 tablet by mouth daily. 02/18/17   Holley Raring, MD  Nutritional Supplements (FEEDING SUPPLEMENT, NEPRO CARB STEADY,) LIQD Take 237 mLs by mouth See admin instructions. At every dialysis treatment on Tues/Thurs/Sat    [provider]    Family History No family history on file.  Social History Social History   Tobacco Use  . Smoking status: Current Every Day Smoker    Packs/day:  0.12    Years: 15.00    Pack years: 1.80    Types: Cigarettes  . Smokeless tobacco: Never Used  . Tobacco comment: quit smoking s/p hospital discharge/ SMOKES ABOUT 2-3 CIGARETTES A DAY  Substance Use Topics  . Alcohol use: Yes    Alcohol/week: 0.0 oz    Comment: Special Occasional  . Drug use: Yes    Frequency: 5.0 times per week    Types: Marijuana, Cocaine    Comment: Marijuana last time mid May 2019, Cocaine- last tile mid April 2019     Allergies   Tobramycin sulfate   Review of Systems Review of Systems  Respiratory: Negative for chest tightness, shortness of breath, wheezing and stridor.   Cardiovascular: Positive for palpitations. Negative for chest pain and leg swelling.  Gastrointestinal: Negative for abdominal pain, nausea and vomiting.  Musculoskeletal: Negative for arthralgias and myalgias.  Skin: Negative for color change.  Neurological: Negative for syncope.  All other systems reviewed and are  negative.    Physical Exam Updated Vital Signs BP 123/73   Pulse 97   Temp 97.7 F (36.5 C) (Oral)   Resp 16   Ht 5\' 4"  (1.626 m)   Wt 57.3 kg (126 lb 5.2 oz)   SpO2 96%   BMI 21.68 kg/m   Physical Exam  Constitutional: She appears well-developed and well-nourished. No distress.  HENT:  Head: Normocephalic and atraumatic.  Mouth/Throat: Oropharynx is clear and moist.  Eyes: Pupils are equal, round, and reactive to light. Conjunctivae and EOM are normal.  Neck: Normal range of motion. Neck supple.  Cardiovascular: Normal rate, regular rhythm, S1 normal and S2 normal.  No murmur heard. Palpable thrill and audible bruit over fistula of LUE.  Patient has 1+ radial pulse on left.  Normal capillary refill and good perfusion distally.  Pulmonary/Chest: Effort normal and breath sounds normal. She has no wheezes. She has no rales.  Abdominal: Soft. She exhibits no distension. There is no tenderness. There is no guarding.  Musculoskeletal: Normal range of motion. She exhibits no edema or deformity.  Lymphadenopathy:    She has no cervical adenopathy.  Neurological: She is alert.  Cranial nerves grossly intact. Patient moves extremities symmetrically and with good coordination.  Skin: Skin is warm and dry. No rash noted. No erythema.  Psychiatric: She has a normal mood and affect. Her behavior is normal. Judgment and thought content normal.  Nursing note and vitals reviewed.    ED Treatments / Results  Labs (all labs ordered are listed, but only abnormal results are displayed) Labs Reviewed  BASIC METABOLIC PANEL - Abnormal; Notable for the following components:      Result Value   BUN 62 (*)    Creatinine, Ser 9.97 (*)    Calcium 8.1 (*)    GFR calc non Af Amer 5 (*)    GFR calc Af Amer 5 (*)    All other components within normal limits  CBC - Abnormal; Notable for the following components:   RBC 2.98 (*)    Hemoglobin 8.6 (*)    HCT 29.0 (*)    MCHC 29.7 (*)    RDW  22.5 (*)    All other components within normal limits    EKG None  Radiology No results found.  Procedures Procedures (including critical care time)  Medications Ordered in ED Medications - No data to display   Initial Impression / Assessment and Plan / ED Course  I have reviewed the triage vital  signs and the nursing notes.  Pertinent labs & imaging results that were available during my care of the patient were reviewed by me and considered in my medical decision making (see chart for details).  Clinical Course as of Jul 16 1624  Sat Jul 16, 2018  1241 Spoke with Dr. Claiborne Billings of cardiology who recommends increasing the patient's carvedilol dosing to 6.25 twice daily.  Also recommends giving patient 30 mg of Cardizem in ED as well as PRN up to PRN Q6h . May require Q8h schedule if continuing to have atrial tachycardia.    [AM]  62 Spoke with internal medicine resident, Dr. Jari Favre (attending Dr. Daryll Drown) who will admit the patient. Appreciate their consultation and collaboration.   [AM]    Clinical Course User Index [AM] Albesa Seen, PA-C   Patient nontoxic-appearing, afebrile, and hemodynamically stable.  Patient exhibits atrial tachycardia that on further review of rhythm strips and EKG with ED attending and cardiology appear to be intermittent SVT versus sinus tachycardia with a regular rate.  Recommendations from Dr. Claiborne Billings of cardiology as above in addition to [facility] referral to cardiology as an outpatient.  Patient had incidental episode of "fingers turning blue" at presentation the emergency department distal to her vascular cath.  This was resolved by the time I evaluated the patient.  Given the transient nature of it, low suspicion for clot.  We will continue to monitor.  Patient to be admitted to internal medicine teaching service for hemodialysis tomorrow given that she has missed 2 sessions and only gotten partial sessions.  Appreciate their involvement in the care  of this patient.    Final Clinical Impressions(s) / ED Diagnoses   Final diagnoses:  Atrial tachycardia [facility])    ED Discharge Orders    None       Tamala Julian 07/16/18 1626    Mesner, Corene Cornea, MD 07/16/18 [phone]

## 2018-07-16 NOTE — H&P (Addendum)
Date: 07/16/2018               Patient Name:  [name] MRN: [id number]  DOB: Jun 09, 1981 Age / Sex: 37 y.o., female   PCP: Patient, No Pcp Per              Medical Service: Internal Medicine Teaching Service              Attending Physician: Dr. Sid Falcon, MD    First Contact: Rhetta Mura, MS  Pager: [phone]  Second Contact: Dr. Jari Favre  Pager: [phone]            After Hours (After 5p/  First Contact Pager: [phone]  weekends / holidays): Second Contact Pager: [phone]   Chief Complaint: SVT during dialysis   History of Present Illness: Ms. Ferrer is a 69 yoF with a PMH of ESRD on dialysis T,Th, Sat, SLE, HrEF and polysubstance use who presents with SVT during dialysis sessions, causing them to be cut short. The patient was recently hospitalized, which included a stay in the ICU, due to pneumonia. Ms. Dawe states that she has not had a complete dialysis session since Saturday because this Thursday's session was cut 30 minutes in due to her going in to SVT. She states that when this happens, she feels flushed, like riding a roller coaster. She denies chest pain during her previous episodes of SVT and she is able to vagal maneuver herself out of them. The patient's only complaints beside the SVT that is the reason for admission are pain in her neck with no associated sensory deficit (resolved) and "her fingers turing blue," which was addressed in the ED with doppler US of both upper extremities. She denies current chest pain, dizziness, nausea, vomiting, headaches and fatigue but endorses increased SOB. The patient does not currently have a Rheumatologist and was offered help getting plugged in with access to care   In the ED the patient had two BMPs taken that returned similar results, both of which returned similar values with 4.5 K+, Cr of >9 and a stably anemic Hg at 9.5 to 8.6. An EKG and chest x-ray were taken and did not reveal any pathology. In the ED the patient was  given a dose of Carvedilol and diltiazem with improvement in symptoms.     Meds:  No current facility-administered medications on file prior to encounter.    Current Outpatient Medications on File Prior to Encounter  Medication Sig Dispense Refill  . acetaminophen (TYLENOL) 500 MG tablet Take 1,000 mg by mouth 2 (two) times daily.    . calcium acetate (PHOSLO) 667 MG capsule Take 2,001-4,002 mg by mouth See admin instructions. Take 6 capsule (4,002 mg) by mouth three times a day with each meal and 3 capsules (2,001 mg) with each snack    . carvedilol (COREG) 3.125 MG tablet Take 1 tablet (3.125 mg total) by mouth 2 (two) times daily with a meal. 60 tablet 2  . darbepoetin (ARANESP) 60 MCG/0.3ML SOLN injection Inject 0.3 mLs (60 mcg total) into the vein every Wednesday with hemodialysis. (Patient taking differently: Inject 60 mcg into the vein every Saturday. ) 4.2 mL 0  . diphenhydrAMINE (BENADRYL) 25 MG tablet Take 25-50 mg by mouth See admin instructions. Take one tablet (25 mg) by mouth twice daily on Sunday, Monday, Wednesday, Friday, take two tablets (50 mg) once daily at dialysis on Tuesday, Thursday, Saturday.    . ferric gluconate 62.5 mg in sodium chloride 0.9 %  100 mL Inject 62.5 mg into the vein every Thursday with hemodialysis.    Marland Kitchen loperamide (IMODIUM A-D) 2 MG tablet Take 2 mg by mouth See admin instructions. Take one tablet (2 mg) by mouth on dialysis days (Tuesday, Thursday, Saturday)    . multivitamin (RENA-VIT) TABS tablet Take 1 tablet by mouth daily. (Patient taking differently: Take 1 tablet by mouth at bedtime. ) 30 tablet 0  . Nutritional Supplements (NOVASOURCE RENAL) LIQD Take 237 mLs by mouth See admin instructions. Take 1 container (237 mls) by mouth daily on dialysis days (Tuesday, Thursday, Saturday) - either during or after treatment    . albuterol (PROVENTIL HFA;VENTOLIN HFA) 108 (90 Base) MCG/ACT inhaler Inhale 1-2 puffs into the lungs every 6 (six) hours as needed for  wheezing or shortness of breath. (Patient not taking: Reported on 07/16/2018) 6.7 g 0  . ALPRAZolam (XANAX) 0.5 MG tablet Take 0.5 tablets (0.25 mg total) by mouth at bedtime as needed for anxiety. (Patient not taking: Reported on 07/16/2018) 10 tablet 0  . ferric citrate (AURYXIA) 1 GM 210 MG(Fe) tablet Take 210-420 mg by mouth See admin instructions. Take 2 tablets (420 mg) by mouth with meals and 1 tablet (210 mg) with snacks. Do not chew or crush medication. Swallow whole.    . hydrALAZINE (APRESOLINE) 25 MG tablet Take 1 tablet (25 mg total) by mouth 3 (three) times daily. (Patient not taking: Reported on 07/16/2018) 90 tablet 2  . hydroxychloroquine (PLAQUENIL) 200 MG tablet Take 2 tablets (400 mg total) by mouth daily. (Patient not taking: Reported on 07/16/2018) 60 tablet 2  . isosorbide mononitrate (IMDUR) 30 MG 24 hr tablet Take 1 tablet (30 mg total) by mouth daily. (Patient not taking: Reported on 07/16/2018) 30 tablet 2    Allergies: Allergies as of 07/16/2018 - Review Complete 07/01/2018  Allergen Reaction Noted  . Tobramycin sulfate Swelling 07/05/2012   Past Medical History:  Diagnosis Date  . Anemia    low iron - receives iron at dialysis  . Chronic systolic congestive heart failure ([place]) 03/16/2016  . Dyspnea   . ESRD (end stage renal disease) ([place])    Hemo TTHSAT _ [place]  . H/O pericarditis 01/17/2013  . H/O pleural effusion 01/17/2013  . Heart murmur   . Lupus (systemic lupus erythematosus) (HCC)    Previously followed with Dr. Charlestine Night, has not followed up recently  . 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 Pneumonia    many times  . Polysubstance abuse ([place])    cocaine, MJ, tobacco  . S/P pericardiocentesis 01/17/2013   H/o pericardial effusion with tamponade 2006   . Seizures ([place])    during pregnancy 1 time     Family History: Patient denies significant family history of CKD, autoimmune disease  Social History: Patient states that she has not used Cocaine since February when she had her anastomosis for dialysis done.   Review of Systems: A complete ROS was negative except as per HPI.   Physical Exam: Blood pressure (!) 133/98, pulse 88, temperature 99.1 F (37.3 C), temperature source Oral, resp. rate 20, height 5\' 4"  (1.626 m), weight 54.7 kg (120 lb 9.6 oz), SpO2 92 %. General: NAD, resting and about to eat dinner HEENT: moist mucous membranes, poor dentition CV: RRR, nMRG  Pulm: crackles at bilateral lung bases, CTAB in upper lung fields, no increased work  of breathing Abd: NTND, BS+ Ext: no peripheral edema, extremities warm and well perfused, radial and dorsalis pedis pulses present bilaterally Neuro: CN 2-12 grossly intact Psych: not depressed or anxious appearing    EKG: personally reviewed my interpretation is several beats of SVT, LVH evident, sinus rhythm otherwise with no evidence of acute ischemia   CXR: personally reviewed my interpretation is no pleural effusion or pneumothorax, pulmonary vascular congestion without frank edema or consolidation, possible cardiomegaly   Assessment & Plan by Problem: Active Problems:   SVT (supraventricular tachycardia) (HCC)  SVT: Ms Ziemba is on dialysis secondary to lupus nephritis and has not completed a round of dialysis in a week secondary to going into SVT during her treatments. This is a problem she has developed since her recent hospitalization and ICU stay and one that she has good insight into. She knows that she feels flushed when this occurs and is able to perform a vagal maneuver when it occurs. She denies current chest pain or dizziness. Cardiology was consulted in the ED who recommended increasing coreg dose and adding on PRN diltiazem 30mg  q6hr; they will arrange follow up outpatient. -Increase Carvedilol 3.125mg  BID--> 6.25mg   BID -Begin Diltiazem 30 mg q5hr PRN  ESRD on Dialysis T,Th, Sat 2/2 Lupus Nephritis:  The patient states that she has not had a Rheumatologist in years. We will provide counseling on the importance of disease maintenance and provide resources for primary care and Rheumatology. She has not taken maintenance medications for her lupus for some years now. --CM consult to establish PCP and referral for rheumatology --nephrology consult for HD on Sunday  Hypertension  H/o combined CHF (last EF 2/18 45%) She is not taking hydralazine and isosorbide.  -Carvedilol 6.25 mg BID -will assess ability to add on hydral and isosorbide  Dispo: Admit patient to Inpatient with expected length of stay greater than 2 midnights.  Signed: Rhetta Mura, Medical Student 07/16/2018, 5:03 PM  Pager: [phone]  Attestation for Student Documentation:  I personally was present and performed or re-performed the history, physical exam and medical decision-making activities of this service and have verified that the service and findings are accurately documented in the student's note.  Alphonzo Grieve, MD 07/16/2018, 6:42 PM  [phone]

## 2018-07-16 NOTE — ED Notes (Signed)
Pt continues to have fast heart rate intermittently-- 80-160-- asked for O2 -- placed at 2L/m/[place]

## 2018-07-16 NOTE — ED Triage Notes (Signed)
Pt to ED with episode of SVT 45 minutes into dialysis- same thing happened on Thursday- today received Adenosine 6mg  without change, Adenosine 12mg  converted to RSR.  Pt also has left hand numbness and is cool to touch. Fingers on left hand are cyanotic, radial pulse is weaker than on right side. Able to doppler radial and ulnar pulses.   Last full dialysis was last saturday

## 2018-07-16 NOTE — ED Notes (Signed)
Renal Tray Ordered @ 1343-per Lennette Bihari, RN-called by Levada Dy

## 2018-07-16 NOTE — Progress Notes (Signed)
[name] is complaining of shortness of breath and nausea.  Sinus rhythm 80's on the monitor, oxygen saturation 95% on 2L.  Lungs clear to auscultation bilaterally and non labored. States that dyspnea has improved but still asking for an antiemetic.  MD on call paged.

## 2018-07-17 ENCOUNTER — Encounter (HOSPITAL_COMMUNITY): Payer: Self-pay

## 2018-07-17 DIAGNOSIS — E8770 Fluid overload, unspecified: Secondary | ICD-10-CM

## 2018-07-17 DIAGNOSIS — E875 Hyperkalemia: Secondary | ICD-10-CM

## 2018-07-17 DIAGNOSIS — R0601 Orthopnea: Secondary | ICD-10-CM

## 2018-07-17 LAB — CBC
HCT: 27.9 % — ABNORMAL LOW (ref 36.0–46.0)
Hemoglobin: 8.2 g/dL — ABNORMAL LOW (ref 12.0–15.0)
MCH: 28.5 pg (ref 26.0–34.0)
MCHC: 29.4 g/dL — ABNORMAL LOW (ref 30.0–36.0)
MCV: 96.9 fL (ref 78.0–100.0)
Platelets: 243 10*3/uL (ref 150–400)
RBC: 2.88 MIL/uL — AB (ref 3.87–5.11)
RDW: 21.9 % — AB (ref 11.5–15.5)
WBC: 4.2 10*3/uL (ref 4.0–10.5)

## 2018-07-17 LAB — RENAL FUNCTION PANEL
ALBUMIN: 2.1 g/dL — AB (ref 3.5–5.0)
Albumin: 2.1 g/dL — ABNORMAL LOW (ref 3.5–5.0)
Anion gap: 15 (ref 5–15)
Anion gap: 14 (ref 5–15)
BUN: 81 mg/dL — AB (ref 6–20)
BUN: 84 mg/dL — ABNORMAL HIGH (ref 6–20)
CHLORIDE: 100 mmol/L (ref 98–111)
CO2: 25 mmol/L (ref 22–32)
CO2: 26 mmol/L (ref 22–32)
CREATININE: 11.92 mg/dL — AB (ref 0.44–1.00)
Calcium: 8.9 mg/dL (ref 8.9–10.3)
Calcium: 9 mg/dL (ref 8.9–10.3)
Chloride: 100 mmol/L (ref 98–111)
Creatinine, Ser: 12.45 mg/dL — ABNORMAL HIGH (ref 0.44–1.00)
GFR calc Af Amer: 4 mL/min — ABNORMAL LOW (ref >60)
GFR calc non Af Amer: 3 mL/min — ABNORMAL LOW (ref >60)
GFR, EST AFRICAN AMERICAN: 4 mL/min — AB (ref >60)
GFR, EST NON AFRICAN AMERICAN: 4 mL/min — AB (ref >60)
Glucose, Bld: 63 mg/dL — ABNORMAL LOW (ref 70–99)
Glucose, Bld: 90 mg/dL (ref 70–99)
PHOSPHORUS: 10.2 mg/dL — AB (ref 2.5–4.6)
POTASSIUM: 6.4 mmol/L — AB (ref 3.5–5.1)
Phosphorus: 9.8 mg/dL — ABNORMAL HIGH (ref 2.5–4.6)
Potassium: 6.1 mmol/L — ABNORMAL HIGH (ref 3.5–5.1)
Sodium: 140 mmol/L (ref 135–145)
Sodium: 140 mmol/L (ref 135–145)

## 2018-07-17 MED ORDER — HYDROXYZINE HCL 25 MG PO TABS
25.0000 mg | ORAL_TABLET | Freq: Three times a day (TID) | ORAL | Status: DC | PRN
  Administered 2018-07-17 – 2018-07-19 (×4): 25 mg via ORAL
  Filled 2018-07-17 (×3): qty 1

## 2018-07-17 MED ORDER — ACETAMINOPHEN 325 MG PO TABS
ORAL_TABLET | ORAL | Status: AC
  Filled 2018-07-17: qty 2

## 2018-07-17 MED ORDER — SODIUM POLYSTYRENE SULFONATE 15 GM/60ML PO SUSP
15.0000 g | Freq: Once | ORAL | Status: AC
  Administered 2018-07-17: 15 g via ORAL
  Filled 2018-07-17: qty 60

## 2018-07-17 MED ORDER — CALCITRIOL 0.5 MCG PO CAPS
0.7500 ug | ORAL_CAPSULE | ORAL | Status: DC
  Administered 2018-07-17: 0.75 ug via ORAL

## 2018-07-17 MED ORDER — LIDOCAINE-PRILOCAINE 2.5-2.5 % EX CREA: 1.0000 "application " | TOPICAL_CREAM | CUTANEOUS | Status: DC | PRN

## 2018-07-17 MED ORDER — CHLORHEXIDINE GLUCONATE CLOTH 2 % EX PADS: 6.0000 | MEDICATED_PAD | Freq: Every day | CUTANEOUS | Status: DC

## 2018-07-17 MED ORDER — SODIUM POLYSTYRENE SULFONATE PO POWD: 35.0000 g | Freq: Once | ORAL | Status: DC

## 2018-07-17 MED ORDER — SODIUM CHLORIDE 0.9 % IV SOLN: 100.0000 mL | INTRAVENOUS | Status: DC | PRN

## 2018-07-17 MED ORDER — PENTAFLUOROPROP-TETRAFLUOROETH EX AERO: 1.0000 "application " | INHALATION_SPRAY | CUTANEOUS | Status: DC | PRN

## 2018-07-17 MED ORDER — CALCITRIOL 0.25 MCG PO CAPS
ORAL_CAPSULE | ORAL | Status: AC
  Filled 2018-07-17: qty 3

## 2018-07-17 MED ORDER — CINACALCET HCL 30 MG PO TABS
30.0000 mg | ORAL_TABLET | Freq: Every day | ORAL | Status: DC
  Administered 2018-07-18: 30 mg via ORAL
  Filled 2018-07-17 (×2): qty 1

## 2018-07-17 MED ORDER — HYDROXYZINE HCL 25 MG PO TABS
ORAL_TABLET | ORAL | Status: AC
  Filled 2018-07-17: qty 1

## 2018-07-17 MED ORDER — SODIUM POLYSTYRENE SULFONATE PO POWD: 30.0000 g | Freq: Once | ORAL | Status: DC

## 2018-07-17 MED ORDER — LIDOCAINE HCL (PF) 1 % IJ SOLN: 5.0000 mL | INTRAMUSCULAR | Status: DC | PRN

## 2018-07-17 MED ORDER — HEPARIN SODIUM (PORCINE) 1000 UNIT/ML DIALYSIS: 2000.0000 [IU] | Freq: Once | INTRAMUSCULAR | Status: DC

## 2018-07-17 NOTE — Consult Note (Addendum)
[facility] Renal Consultation Note    Indication for Consultation:  Management of ESRD/hemodialysis; anemia, hypertension/volume and secondary hyperparathyroidism PCP:  HPI: [name] is a 37 y.o. female with ESRD 2/2 to lupus nephritis on hemodialysis T,Th,S at [facility]. PMH: SVT, Polysubstance abuse, medical noncompliance, pericarditis, streptococcal bacteremia, pericardial effusion, seizures, AOCD, SHPT. Had 2.72 total hours of HD last week. Currently 7.7 kgs above OP EDW.   Patient went to HD yesterday, signed off after 1:03 hrs with C/O SOB/SVT. (No heart monitor at HD unit). Encouraged by staff to stay on hemodialysis but insisted on being sent to ED. Upon arrival to ED, labs were unremarkable, EKG showed ST, CXR showed stable cardiomegaly and patchy chronic interstitial disease in bothlungs with mildly improved left basilar pleural and parenchymal scarring. She was given carvedilol and diltiazem with improvement in symptoms. Cardiology was consulted in ED. They will follow as OP. She has been admitted per primary with SVT.   Currently she is lying flat in bed, says she is still SOB. K+ noted to be 6.4. Has rec'd 15 G Kayexalate per primary. Will have HD today for hyperkalemia. SR on monitor. No other complaints at present.   Past Medical History:  Diagnosis Date  . Anemia    low iron - receives iron at dialysis  . Chronic systolic congestive heart failure ([place]) 03/16/2016  . Dyspnea   . ESRD (end stage renal disease) ([place])    Hemo TTHSAT _ [place]  . H/O pericarditis 01/17/2013  . H/O pleural effusion 01/17/2013  . Heart murmur   . Lupus (systemic lupus erythematosus) (HCC)    Previously followed with Dr. Charlestine Night, has not followed up recently  . 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  Pneumonia    many times  . Polysubstance abuse ([place])    cocaine, MJ, tobacco  . S/P pericardiocentesis 01/17/2013   H/o pericardial effusion with tamponade 2006   . Seizures ([place])    during pregnancy 1 time  . Streptococcal bacteremia 01/23/2013   She had two S. pneumonae bacteremia on 01/21/2013. Sensitive to Peniccilin    Past Surgical History:  Procedure Laterality Date  . AV FISTULA PLACEMENT    . BASCILIC VEIN TRANSPOSITION Left 02/05/2014   Procedure: [place];  Surgeon: [name], MD;  Location: [place];  Service: Vascular;  Laterality: Left;  . FISTULA SUPERFICIALIZATION Left 05/30/2018   Procedure: FISTULA PLICATION BASILIC VEIN TRANSPOSITION;  Surgeon: Angelia Mould, MD;  Location: [place];  Service: Vascular;  Laterality: Left;  Marland Kitchen VENOGRAM Right 01/31/2014   Procedure: DIALYSIS CATHETER;  Surgeon: Serafina Mitchell, MD;  Location: [facility] CATH LAB;  Service: Cardiovascular;  Laterality: Right;   History reviewed. No pertinent family history. Social History:  reports that she has been smoking cigarettes.  She has a 1.80 pack-year smoking history. She has never used smokeless tobacco. She reports that she drinks alcohol. She reports that she has current or past drug history. Drugs: Marijuana and Cocaine. Frequency: 5.00 times per week. Allergies  Allergen Reactions  . Tobramycin Sulfate Swelling   Prior to Admission medications   Medication Sig Start Date End Date Taking? Authorizing Provider  acetaminophen (TYLENOL) 500 MG tablet Take 1,000 mg by mouth 2 (two) times daily.   Yes [provider]  calcium acetate (PHOSLO) 667 MG capsule Take 2,001-4,002  mg by mouth See admin instructions. Take 6 capsule (4,002 mg) by mouth three times a day with each meal and 3 capsules (2,001 mg) with each snack   Yes [provider]  carvedilol (COREG) 3.125 MG tablet Take 1 tablet (3.125 mg total) by mouth 2 (two) times daily with a meal. 05/21/18  Yes Cardama,  Grayce Sessions, MD  darbepoetin (ARANESP) 60 MCG/0.3ML SOLN injection Inject 0.3 mLs (60 mcg total) into the vein every Wednesday with hemodialysis. Patient taking differently: Inject 60 mcg into the vein every Saturday.  02/08/14  Yes Jessee Avers, MD  diphenhydrAMINE (BENADRYL) 25 MG tablet Take 25-50 mg by mouth See admin instructions. Take one tablet (25 mg) by mouth twice daily on Sunday, Monday, Wednesday, Friday, take two tablets (50 mg) once daily at dialysis on Tuesday, Thursday, Saturday.   Yes [provider]  ferric gluconate 62.5 mg in sodium chloride 0.9 % 100 mL Inject 62.5 mg into the vein every Thursday with hemodialysis. 03/09/16  Yes Rice, Resa Miner, MD  loperamide (IMODIUM A-D) 2 MG tablet Take 2 mg by mouth See admin instructions. Take one tablet (2 mg) by mouth on dialysis days (Tuesday, Thursday, Saturday)   Yes [provider]  multivitamin (RENA-VIT) TABS tablet Take 1 tablet by mouth daily. Patient taking differently: Take 1 tablet by mouth at bedtime.  02/18/17  Yes Holley Raring, MD  Nutritional Supplements (NOVASOURCE RENAL) LIQD Take 237 mLs by mouth See admin instructions. Take 1 container (237 mls) by mouth daily on dialysis days (Tuesday, Thursday, Saturday) - either during or after treatment   Yes [provider]  albuterol (PROVENTIL HFA;VENTOLIN HFA) 108 (90 Base) MCG/ACT inhaler Inhale 1-2 puffs into the lungs every 6 (six) hours as needed for wheezing or shortness of breath. Patient not taking: Reported on 07/16/2018 02/03/16   Lucious Groves, DO  ALPRAZolam Duanne Moron) 0.5 MG tablet Take 0.5 tablets (0.25 mg total) by mouth at bedtime as needed for anxiety. Patient not taking: Reported on 07/16/2018 07/04/18   Montey Hora P, PA-C  ferric citrate (AURYXIA) 1 GM 210 MG(Fe) tablet Take 210-420 mg by mouth See admin instructions. Take 2 tablets (420 mg) by mouth with meals and 1 tablet (210 mg) with snacks. Do not chew or crush medication.  Swallow whole.    [provider]  hydrALAZINE (APRESOLINE) 25 MG tablet Take 1 tablet (25 mg total) by mouth 3 (three) times daily. Patient not taking: Reported on 07/16/2018 07/04/18   Montey Hora P, PA-C  hydroxychloroquine (PLAQUENIL) 200 MG tablet Take 2 tablets (400 mg total) by mouth daily. Patient not taking: Reported on 07/16/2018 07/04/18   Montey Hora P, PA-C  isosorbide mononitrate (IMDUR) 30 MG 24 hr tablet Take 1 tablet (30 mg total) by mouth daily. Patient not taking: Reported on 07/16/2018 07/04/18   Montey Hora P, PA-C   Current Facility-Administered Medications  Medication Dose Route Frequency Provider Last Rate Last Dose  . 0.9 %  sodium chloride infusion   Intravenous PRN Sid Falcon, MD      . acetaminophen (TYLENOL) tablet 650 mg  650 mg Oral Q6H PRN Alphonzo Grieve, MD       Or  . acetaminophen (TYLENOL) suppository 650 mg  650 mg Rectal Q6H PRN Alphonzo Grieve, MD      . calcium acetate (PHOSLO) capsule 2,001 mg  2,001 mg Oral PRN Gilles Chiquito B, MD      . calcium acetate (PHOSLO) capsule 4,002 mg  4,002  mg Oral TID WC Alphonzo Grieve, MD   4,002 mg at 07/17/18 0820  . carvedilol (COREG) tablet 6.25 mg  6.25 mg Oral BID WC Alphonzo Grieve, MD   6.25 mg at 07/17/18 0820  . diltiazem (CARDIZEM) tablet 30 mg  30 mg Oral Q6H PRN Alphonzo Grieve, MD      . heparin injection 5,000 Units  5,000 Units Subcutaneous Q8H Alphonzo Grieve, MD   5,000 Units at 07/17/18 0657  . hydrOXYzine (ATARAX/VISTARIL) tablet 10 mg  10 mg Oral TID PRN Alphonzo Grieve, MD   10 mg at 07/17/18 0946  . multivitamin (RENA-VIT) tablet 1 tablet  1 tablet Oral QHS Alphonzo Grieve, MD   1 tablet at 07/16/18 2329  . ondansetron (ZOFRAN) tablet 4 mg  4 mg Oral Q8H PRN Carroll Sage, MD   4 mg at 07/17/18 0701  . polyethylene glycol (MIRALAX / GLYCOLAX) packet 17 g  17 g Oral Daily PRN Alphonzo Grieve, MD       Labs: Basic Metabolic Panel: Recent Labs  Lab 07/14/18 0925 07/16/18 1054  07/17/18 0612  NA 139 140 140  K 4.7 4.3 6.4*  CL 100 99 100  CO2 26 27 25   GLUCOSE 83 95 63*  BUN 55* 62* 81*  CREATININE 9.01* 9.97* 11.92*  CALCIUM 8.3* 8.1* 8.9  PHOS  --   --  10.2*   Liver Function Tests: Recent Labs  Lab 07/17/18 0612  ALBUMIN 2.1*   No results for input(s): LIPASE, AMYLASE in the last 168 hours. No results for input(s): AMMONIA in the last 168 hours. CBC: Recent Labs  Lab 07/14/18 0925 07/16/18 1054 07/17/18 0612  WBC 5.4 4.8 4.2  HGB 9.5* 8.6* 8.2*  HCT 32.8* 29.0* 27.9*  MCV 98.5 97.3 96.9  PLT 303 269 243   Cardiac Enzymes: No results for input(s): CKTOTAL, CKMB, CKMBINDEX, TROPONINI in the last 168 hours. CBG: No results for input(s): GLUCAP in the last 168 hours. Iron Studies: No results for input(s): IRON, TIBC, TRANSFERRIN, FERRITIN in the last 72 hours. Studies/Results: Dg Chest Port 1 View  Result Date: 07/16/2018 CLINICAL DATA:  Shortness of breath. 45 minutes episode of supraventricular tachycardia during dialysis, converted to normal sinus rhythm with adenosine. Cyanosis of the left fingers. EXAM: PORTABLE CHEST 1 VIEW COMPARISON:  07/02/2018 and 06/21/2018. FINDINGS: No significant change in enlargement of the cardiac silhouette and patchy prominence of the interstitial markings in both lungs. Mild pleural and parenchymal scarring at the left lung base with mild improvement. Unremarkable bones. IMPRESSION: Stable cardiomegaly and patchy chronic interstitial disease in both lungs with mildly improved left basilar pleural and parenchymal scarring. Electronically Signed   By: Claudie Revering M.D.   On: 07/16/2018 11:59    ROS: As per HPI otherwise negative.   Physical Exam: Vitals:   07/16/18 2105 07/17/18 0432 07/17/18 0434 07/17/18 0905  BP: (!) 135/92  (!) 139/97   Pulse: 92  (!) 104   Resp: 12  18   Temp: 98.3 F (36.8 C)  98.1 F (36.7 C)   TempSrc: Oral  Oral   SpO2: 98%  (!) 77% 95%  Weight:  54.8 kg (120 lb 12.8 oz)     Height:         General: Thin female in no acute distress. Head: Normocephalic, atraumatic, sclera non-icteric, mucus membranes are moist. Face is puffy.  Neck: Supple. JVD not elevated. Lungs: Bilateral breath sounds slightly decreased in bases, otherwise CTAB.  Heart: RRR with S1 S2.  4/6 systolic M. JVD 1/3 to mandible.  Abdomen: Soft, non-tender, non-distended with normoactive bowel sounds. No rebound/guarding. No obvious abdominal masses. M-S:  Strength and tone appear normal for age. Lower extremities:without edema or ischemic changes, no open wounds  Neuro: Alert and oriented X 3. Moves all extremities spontaneously. Psych:  Responds to questions appropriately with a normal affect. Dialysis Access: LUA AVF + bruit  Dialysis Orders: [place] T,Th,S 3 hr 45 min 180 NRe 400/Auto 1.5 46.5 kg 2.0K/2.0 Ca  LUA AVF  -Heparin 2000 units IV TIW -Aranesp 200 mcg IV (last dose 07/16/2018 HGB 8.8 07/14/18) -Venofer 50 mg IV q weekly (last dose 07/16/18) -Calcitriol 0.75 mcg PO TIW  Assessment/Plan: 1.  Hyperkalemia D/T shortened HD-HD today. Rec'd 15 G Kayexalate earlier. Check labs again prior to HD. Start on 1.0 K bath for 30 minutes then switch to 2.0 K bath and finish treatment.  2.  SVT-SR on monitor. Per primary. Discussed taking diltiazem at night prior to HD and immediately post HD. Hopefully this would cover her for treatment and appear more logical dosing for HD.  3.  ESRD -  T,Th,S. Non-compliant with HD citing SVT as reason for signing off early. There are no heart monitors at unit and this cannot be confirmed. Encouraged compliance with HD, discussed dangers of hyperkalemia as noted above and uremia.  4.  Hypertension/volume  - Currently 7.7 above OP EDW. Attempt 4 liters in HD today. BP well controlled. Cont dilt and carvedilol.  5.  Anemia  - HGB 8.2. Rec'd ESA and venofer 07/16/2018. Follow HGB.  6.  Metabolic bone disease - Ca 8.9 Phos 10.2. Needs binder compliance. Cont  binders, VDRA, sensipar 7.  Nutrition - Albumin 2.1 renal diet add prostat, renal vits.   Taelon Bendorf H. Owens Shark, NP-C 07/17/2018, 11:41 AM  [employer] [phone]

## 2018-07-17 NOTE — Progress Notes (Signed)
   Subjective:  Patient states she started feeling short of breath last night; this has become progressive; she has some relief with oxygen. She has orthopnea, and a mildly productive cough with streaks of blood which is usual for her when she has pulm edema. She denies chest pain; denies further episodes of palpitations.  Objective:  Vital signs in last 24 hours: Vitals:   07/17/18 0432 07/17/18 0434 07/17/18 0905 07/17/18 1223  BP:  (!) 139/97  (!) 146/108  Pulse:  (!) 104  90  Resp:  18  20  Temp:  98.1 F (36.7 C)  98.7 F (37.1 C)  TempSrc:  Oral  Oral  SpO2:  (!) 77% 95% 98%  Weight: 120 lb 12.8 oz (54.8 kg)     Height:       Constitutional: NAD CV: RRR Resp: Bilateral rales to mid lung fields, mild tachypnea Abd: mild distension, soft, nontender, +BS Ext: minimal LE edema, warm, pulses intact  Assessment/Plan:  Active Problems:   SLE (systemic lupus erythematosus) (HCC)   ESRD on dialysis (HCC)   SVT (supraventricular tachycardia) (HCC)  SVT: Now maintained in SR; no further symptoms.  --continue coreg 6.25mg  BID, diltiazem 30mg  q6hr PRN --f/u with cardiology outpatient  Volume overload Hyperkalemia ESRD on HD TTS: Patient on initial admission only mildly vol up with stable electrolytes, however overnight she developed acute shortness of breath and required O2 administrating; this morning she is hyperkalemic to 6.4 with increased amplitude of her Twaves and on exam she has rales to mid lungs bilaterally.  --nephro consulted for HD; it appears she is >7kgs up from dry weight so may need serial HD prior to discharge --1x dose of kayexalate 15mg    HTN H/o combined CHF (last EF 2/18 was 45%): --Coreg 6.25mg  BID --assess ability to add on hydral and isosorbide --cards follow up outpt  H/o lupus: Care management consulted for PCP and referral to rheumatology to establish care.   Dispo: Anticipated discharge in approximately 2 day(s).   Alphonzo Grieve,  MD 07/17/2018, 1:13 PM IMTS - PGY3 Pager [phone]

## 2018-07-17 NOTE — Progress Notes (Signed)
CRITICAL VALUE ALERT  Critical Value:  Potassium 6.4  Date & Time Notied:  7195, 07/17/18  Provider Notified: IMTS  Orders Received/Actions taken: EKG; await for nephrology plan; kayexalate

## 2018-07-17 NOTE — Procedures (Signed)
Patient seen on Hemodialysis, appears comfortable with mild dyspnea on oxygen via [place]. QB 400, UF goal 4L Treatment adjusted as needed.  Elmarie Shiley MD [facility]. Office # [phone] Pager # [phone] 5:02 PM

## 2018-07-18 DIAGNOSIS — J811 Chronic pulmonary edema: Secondary | ICD-10-CM

## 2018-07-18 LAB — RENAL FUNCTION PANEL
ALBUMIN: 2.2 g/dL — AB (ref 3.5–5.0)
Anion gap: 10 (ref 5–15)
BUN: 29 mg/dL — ABNORMAL HIGH (ref 6–20)
CALCIUM: 8.8 mg/dL — AB (ref 8.9–10.3)
CO2: 29 mmol/L (ref 22–32)
CREATININE: 6.39 mg/dL — AB (ref 0.44–1.00)
Chloride: 98 mmol/L (ref 98–111)
GFR calc Af Amer: 9 mL/min — ABNORMAL LOW (ref >60)
GFR, EST NON AFRICAN AMERICAN: 8 mL/min — AB (ref >60)
Glucose, Bld: 68 mg/dL — ABNORMAL LOW (ref 70–99)
Phosphorus: 6.6 mg/dL — ABNORMAL HIGH (ref 2.5–4.6)
Potassium: 4.7 mmol/L (ref 3.5–5.1)
Sodium: 137 mmol/L (ref 135–145)

## 2018-07-18 MED ORDER — CHLORHEXIDINE GLUCONATE CLOTH 2 % EX PADS: 6.0000 | MEDICATED_PAD | Freq: Every day | CUTANEOUS | Status: DC

## 2018-07-18 MED ORDER — HYDROXYCHLOROQUINE SULFATE 200 MG PO TABS
200.0000 mg | ORAL_TABLET | Freq: Every day | ORAL | Status: DC
  Administered 2018-07-18 – 2018-07-19 (×2): 200 mg via ORAL
  Filled 2018-07-18 (×2): qty 1

## 2018-07-18 NOTE — Plan of Care (Signed)
  Problem: Health Behavior/Discharge Planning: Goal: Ability to manage health-related needs will improve Outcome: Adequate for Discharge   Problem: Clinical Measurements: Goal: Ability to maintain clinical measurements within normal limits will improve Outcome: Adequate for Discharge   

## 2018-07-18 NOTE — Progress Notes (Signed)
  Date: 07/18/2018  Patient name: [name]  Medical record number: [id number]  Date of birth: 06-15-1981   I have seen and evaluated this patient and I have discussed the plan of care with the house staff. Please see their note for complete details. I concur with their findings with the following additions/corrections: [name] was seen on AM rounds with team. Feeling better after HD and did not feel as if she went into SVT with HD. F/U with renal regarding further inpt HD vs resuming outpt regimen. Pt agrees to starting hydroxychloroquine. Has tolerated it in past but did not have PCP or rheum to Rx it. Will need optho eye exam very soon as outpt.   Bartholomew Crews, MD 07/18/2018, 12:31 PM

## 2018-07-18 NOTE — Progress Notes (Addendum)
Subjective:  [name] states that she feels better this morning she had a full session of dialysis yesterday and did not go into SVT. She has not felt the flushed feeling that usually accompanies these episodes and she has not felt like her heart has been racing or endorse chest pain. She endorses decreased SOB and is able to lie flat at night to sleep without difficulty breathing.     Objective:  Vital signs in last 24 hours: Vitals:   07/17/18 1817 07/17/18 2010 07/17/18 2333 07/18/18 0417  BP: (!) 143/89 (!) 139/91 (!) 144/101 (!) 151/104  Pulse: 92 98 (!) 101 88  Resp: 19 20 18 19   Temp: 98.4 F (36.9 C) 100 F (37.8 C) 99.8 F (37.7 C) 98.4 F (36.9 C)  TempSrc: Oral Oral Oral Oral  SpO2: 100% 96% 95% 99%  Weight: 50.7 kg (111 lb 12.4 oz)   50.7 kg (111 lb 12.8 oz)  Height:       Weight change: -2.2 kg (-4 lb 13.6 oz)  Intake/Output Summary (Last 24 hours) at 07/18/2018 1105 Last data filed at 07/18/2018 0900 Gross per 24 hour  Intake 360 ml  Output 4000 ml  Net -3640 ml   Physical Exam : General: no acute distress, sitting up in bed  Pulm: No increased work of breathing, bibasilar rales CV:  RRR, nMRG appreciated  Abd: BS+, NTND EXT: No peripheral edema, dorsalis pedis pulses present bilater    Assessment/Plan:  Active Problems:   SLE (systemic lupus erythematosus) (HCC)   ESRD on dialysis (HCC)   SVT (supraventricular tachycardia) (HCC)  #ESRD on Dialysis Pulmonary edema Patient with pulm edema yesterday and hyperkalemia in setting of not being able to complete HD sessions for 1wk. She was able to get HD yesterday -  down 4L after this session, but still up from her dry weight. Before dialysis she was hyperkalemic to 6.1 however, this morning her K+ was 4.7. She is scheduled for outpatient dialysis on T, Th, Sat and given her ability to stay through this last inpatient dialysis session without SVT, since initiation of diltiazem, she is likely to be able to  resume dialysis as an outpatient. She still appears volume up on exam today. 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 We will recheck her blood pressure after HD to see if her improved volume status has improved the control of her htn. -carvedilol 6.25 mg- cardiology follow up to titrate   -consider hydral and isosorbide if hypertensive after pt is caught up on HD   #SVT: No further episodes. --coreg 6.25mg  BID --diltiazem 30mg  q6hr PRN --f/u with cards outpt  #SLE  Patient has not had follow up for SLE in years. She expressed willingness to begin Plaquenil as an inpatient, but has done this in the past and was lost to follow-up.  -Care Management to give list of PCPs- help to plug into Rheumatology  -begin Plaquenil 200 mg     LOS: 2 days   Rhetta Mura, Medical Student 07/18/2018, 11:05 AM  [phone]  Attestation for Student Documentation:  I personally was present and performed or re-performed the history, physical exam and medical decision-making activities of this service and have verified that the service and findings are accurately documented in the student's note.  Alphonzo Grieve, MD 07/18/2018,  4:50 PM  [phone]

## 2018-07-18 NOTE — Progress Notes (Signed)
[facility] Progress Note  Subjective: breathing better but still not back to quite to normal  Vitals:   07/17/18 2010 07/17/18 2333 07/18/18 0417 07/18/18 1248  BP: (!) 139/91 (!) 144/101 (!) 151/104 (!) 143/93  Pulse: 98 (!) 101 88 85  Resp: 20 18 19 16   Temp: 100 F (37.8 C) 99.8 F (37.7 C) 98.4 F (36.9 C) (!) 97.4 F (36.3 C)  TempSrc: Oral Oral Oral Oral  SpO2: 96% 95% 99% 97%  Weight:   50.7 kg (111 lb 12.8 oz)   Height:        Inpatient medications: . calcitRIOL  0.75 mcg Oral Q T,Th,Sa-HD  . calcium acetate  4,002 mg Oral TID WC  . carvedilol  6.25 mg Oral BID WC  . Chlorhexidine Gluconate Cloth  6 each Topical Q0600  . cinacalcet  30 mg Oral Q supper  . heparin  5,000 Units Subcutaneous Q8H  . hydroxychloroquine  200 mg Oral Daily  . multivitamin  1 tablet Oral QHS   . sodium chloride     sodium chloride, acetaminophen **OR** acetaminophen, calcium acetate, diltiazem, hydrOXYzine, ondansetron, polyethylene glycol  Iron/TIBC/Ferritin/ %Sat    Component Value Date/Time   IRON 19 (L) 01/30/2014 0020   TIBC 127 (L) 01/30/2014 0020   FERRITIN 133 01/30/2014 0020   IRONPCTSAT 15 (L) 01/30/2014 0020    Exam:  no distress  no jvd  chest some minor rales at bases, o/w clear  cor reg no mrg  abd soft ntnd  ext no edema  lua avf+bruit  Dialysis: [facility]  3h 49min  46.5kg   lua avf  Hep 2000  -Aranesp 200 mcg IV (last dose 07/16/2018 HGB 8.8 07/14/18)  -Venofer 50 mg IV q weekly (last dose 07/16/18)  - Calcitriol 0.75 mcg PO TIW       Impression: 1  SOB/ mid pulm edema - improving, plan HD again tomorrow am, ok for dc after HD tomorrow.  2  ESRD HD TTS 3  SLE 4  HTN - cont meds 5  Anemia ckd - cont meds   Plan - as above   Kelly Splinter MD [facility] pager [phone]   07/18/2018, 2:25 PM   Recent Labs  Lab 07/17/18 1520 07/18/18 0739  NA 140 137  K 6.1* 4.7  CL 100 98  CO2 26 29  GLUCOSE 90 68*  BUN  84* 29*  CREATININE 12.45* 6.39*  CALCIUM 9.0 8.8*  PHOS 9.8* 6.6*  ALBUMIN 2.1* 2.2*   No results for input(s): AST, ALT, ALKPHOS, BILITOT, PROT in the last 168 hours. Recent Labs  Lab 07/16/18 1054 07/17/18 0612  WBC 4.8 4.2  HGB 8.6* 8.2*  HCT 29.0* 27.9*  MCV 97.3 96.9  PLT 269 243

## 2018-07-18 NOTE — Progress Notes (Signed)
Patients diastolic BP elevated, seems to be elevated often. Patient asymptomatic. Will continue to monitor.

## 2018-07-19 LAB — RENAL FUNCTION PANEL
ANION GAP: 11 (ref 5–15)
Albumin: 2.2 g/dL — ABNORMAL LOW (ref 3.5–5.0)
BUN: 47 mg/dL — ABNORMAL HIGH (ref 6–20)
CO2: 27 mmol/L (ref 22–32)
Calcium: 8.8 mg/dL — ABNORMAL LOW (ref 8.9–10.3)
Chloride: 99 mmol/L (ref 98–111)
Creatinine, Ser: 8.78 mg/dL — ABNORMAL HIGH (ref 0.44–1.00)
GFR, EST AFRICAN AMERICAN: 6 mL/min — AB (ref >60)
GFR, EST NON AFRICAN AMERICAN: 5 mL/min — AB (ref >60)
Glucose, Bld: 91 mg/dL (ref 70–99)
Phosphorus: 7.1 mg/dL — ABNORMAL HIGH (ref 2.5–4.6)
Potassium: 4.6 mmol/L (ref 3.5–5.1)
Sodium: 137 mmol/L (ref 135–145)

## 2018-07-19 LAB — CBC
HCT: 32.4 % — ABNORMAL LOW (ref 36.0–46.0)
HEMOGLOBIN: 9.6 g/dL — AB (ref 12.0–15.0)
MCH: 28.4 pg (ref 26.0–34.0)
MCHC: 29.6 g/dL — ABNORMAL LOW (ref 30.0–36.0)
MCV: 95.9 fL (ref 78.0–100.0)
Platelets: 270 10*3/uL (ref 150–400)
RBC: 3.38 MIL/uL — AB (ref 3.87–5.11)
RDW: 21.1 % — ABNORMAL HIGH (ref 11.5–15.5)
WBC: 3.4 10*3/uL — AB (ref 4.0–10.5)

## 2018-07-19 MED ORDER — ONDANSETRON HCL 4 MG/2ML IJ SOLN
4.0000 mg | Freq: Once | INTRAMUSCULAR | Status: AC
  Administered 2018-07-19: 4 mg via INTRAVENOUS

## 2018-07-19 MED ORDER — HYDROXYZINE HCL 25 MG PO TABS
ORAL_TABLET | ORAL | Status: AC
  Administered 2018-07-19: 25 mg
  Filled 2018-07-19: qty 1

## 2018-07-19 MED ORDER — ONDANSETRON HCL 4 MG/2ML IJ SOLN
INTRAMUSCULAR | Status: AC
  Filled 2018-07-19: qty 2

## 2018-07-19 MED ORDER — HEPARIN SODIUM (PORCINE) 1000 UNIT/ML DIALYSIS: 2000.0000 [IU] | Freq: Once | INTRAMUSCULAR | Status: DC

## 2018-07-19 MED ORDER — DILTIAZEM HCL 30 MG PO TABS: 30.0000 mg | ORAL_TABLET | Freq: Four times a day (QID) | ORAL | 1 refills | Status: DC | PRN

## 2018-07-19 MED ORDER — CALCITRIOL 0.5 MCG PO CAPS
ORAL_CAPSULE | ORAL | Status: AC
  Filled 2018-07-19: qty 1

## 2018-07-19 MED ORDER — HYDROXYCHLOROQUINE SULFATE 200 MG PO TABS: 200.0000 mg | ORAL_TABLET | Freq: Every day | ORAL | 2 refills | Status: DC

## 2018-07-19 MED ORDER — CARVEDILOL 6.25 MG PO TABS: 6.2500 mg | ORAL_TABLET | Freq: Two times a day (BID) | ORAL | 2 refills | Status: DC

## 2018-07-19 MED ORDER — CINACALCET HCL 30 MG PO TABS: 30.0000 mg | ORAL_TABLET | Freq: Every day | ORAL | 1 refills | Status: DC

## 2018-07-19 MED ORDER — CALCITRIOL 0.25 MCG PO CAPS
ORAL_CAPSULE | ORAL | Status: AC
  Administered 2018-07-19: 0.75 ug
  Filled 2018-07-19: qty 1

## 2018-07-19 NOTE — Progress Notes (Addendum)
  Subjective:  We saw [name] during her dialysis session this morning. She denies having an episode of SVT during her two inpatient dialysis sessions. She states that her shortness of breath is improved. She denies headaches, vision changes or dizziness.   Objective:  Vital signs in last 24 hours: Vitals:   07/19/18 1000 07/19/18 1015 07/19/18 1030 07/19/18 1045  BP: 116/66 (!) 95/57 122/73 104/67  Pulse: 93 89 85 81  Resp:      Temp:      TempSrc:      SpO2:      Weight:      Height:       Weight change: -3.662 kg (-8 lb 1.2 oz)  Intake/Output Summary (Last 24 hours) at 07/19/2018 1148 Last data filed at 07/19/2018 0352 Gross per 24 hour  Intake 960 ml  Output -  Net 960 ml   Physical Exam : General: no acute distress, on dialysis, resting comfortably  Pulm: No increased work of breathing, CTAB CV:  RRR, nMRG appreciated  Abd: BS+, NTND EXT: No peripheral edema, dorsalis pedis pulses present bilaterally  Assessment/Plan:  Active Problems:   SLE (systemic lupus erythematosus) (HCC)   ESRD on dialysis (HCC)   SVT (supraventricular tachycardia) (HCC)  #ESRD on Dialysis Pulmonary edema Hyperkalemia and pulmonary edema resolved after inpatient dialysis. 3 more liters off today in dialysis. Down 6 kg since admission. She is scheduled for outpatient dialysis on T, Th, Sat and given her ability to [facility] inpatient dialysis sessions without SVT, since initiation of diltiazem, she is likely to be able to resume dialysis as an outpatient.  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 Normotensive after dialysis today.  -carvedilol 6.25 mg- cardiology follow up to titrate   -continue HD to manage htn through volume status    #SVT: No episodes as  inpatient  --coreg 6.25mg  BID --diltiazem 30mg  q6hr PRN --f/u with cards outpt  #SLE  Patient has not had follow up for SLE in years. She expressed willingness to begin Plaquenil as an inpatient, but has done this in the past and was lost to follow-up.  -Care Management- set up with [facility]   -begin Plaquenil 200 mg     LOS: 3 days   Rhetta Mura, Medical Student 07/19/2018, 11:48 AM  [phone]  Attestation for Student Documentation:  I personally was present and performed or re-performed the history, physical exam and medical decision-making activities of this service and have verified that the service and findings are accurately documented in the student's note.  Alphonzo Grieve, MD 07/19/2018, 2:58 PM

## 2018-07-19 NOTE — Progress Notes (Signed)
[facility] Progress Note  Subjective: on HD now, no new c/o  Vitals:   07/19/18 0726 07/19/18 0730 07/19/18 0800 07/19/18 0830  BP: (!) 141/82 (!) 148/81 122/72 (!) 116/33  Pulse: 80 82 72 80  Resp:      Temp:      TempSrc:      SpO2:      Weight:      Height:        Inpatient medications: . calcitRIOL      . calcitRIOL      . calcitRIOL  0.75 mcg Oral Q T,Th,Sa-HD  . calcium acetate  4,002 mg Oral TID WC  . carvedilol  6.25 mg Oral BID WC  . Chlorhexidine Gluconate Cloth  6 each Topical Q0600  . cinacalcet  30 mg Oral Q supper  . [START ON 07/20/2018] heparin  2,000 Units Dialysis Once in dialysis  . heparin  5,000 Units Subcutaneous Q8H  . hydroxychloroquine  200 mg Oral Daily  . hydrOXYzine      . multivitamin  1 tablet Oral QHS   . sodium chloride     sodium chloride, acetaminophen **OR** acetaminophen, calcium acetate, diltiazem, hydrOXYzine, ondansetron, polyethylene glycol  Iron/TIBC/Ferritin/ %Sat    Component Value Date/Time   IRON 19 (L) 01/30/2014 0020   TIBC 127 (L) 01/30/2014 0020   FERRITIN 133 01/30/2014 0020   IRONPCTSAT 15 (L) 01/30/2014 0020    Exam:  no distress  no jvd  chest some minor rales at bases, o/w clear  cor reg no mrg  abd soft ntnd  ext no edema  lua avf+bruit  Dialysis: [facility]  3h 66min  46.5kg   lua avf  Hep 2000  -Aranesp 200 mcg IV (last dose 07/16/2018 HGB 8.8 07/14/18)  -Venofer 50 mg IV q weekly (last dose 07/16/18)  - Calcitriol 0.75 mcg PO TIW       Impression: 1  SOB/ mid pulm edema - improved, on HD now, ok for dc after HD today 2  ESRD HD TTS 3  SLE 4  HTN - cont meds 5  Anemia ckd - cont meds   Plan - as above   Kelly Splinter MD [facility] pager [phone]   07/19/2018, 9:02 AM   Recent Labs  Lab 07/18/18 0739 07/19/18 0730  NA 137 137  K 4.7 4.6  CL 98 99  CO2 29 27  GLUCOSE 68* 91  BUN 29* 47*  CREATININE 6.39* 8.78*  CALCIUM 8.8* 8.8*  PHOS 6.6* 7.1*   ALBUMIN 2.2* 2.2*   No results for input(s): AST, ALT, ALKPHOS, BILITOT, PROT in the last 168 hours. Recent Labs  Lab 07/17/18 0612 07/19/18 0730  WBC 4.2 3.4*  HGB 8.2* 9.6*  HCT 27.9* 32.4*  MCV 96.9 95.9  PLT 243 270

## 2018-07-19 NOTE — Progress Notes (Signed)
  Date: 07/19/2018  Patient name: [name]  Medical record number: [id number]  Date of birth: 03-Jan-1981   I have seen and evaluated this patient and I have discussed the plan of care with the house staff. Please see their note for complete details. I concur with their findings with the following additions/corrections: Stable for D/C with outpt PCP, optho, and renal F/U.  Bartholomew Crews, MD 07/19/2018, 1:41 PM

## 2018-07-19 NOTE — Progress Notes (Signed)
CM talked to patient about needing a PCP; she is agreeable to go to [name] for [facility] up care; CM talked to the patient about the importance of keeping her apts; pt is in a hurry to go home, her cell # [phone]; [facility] [phone]

## 2018-07-19 NOTE — Discharge Summary (Addendum)
Name: [name] MRN: [id number] DOB: May 18, 1981 37 y.o. PCP: Patient, No Pcp Per  Date of Admission: 07/16/2018 10:07 AM Date of Discharge: 07/19/2018 Attending Physician; Dr. Blanch Media  Discharge Diagnosis: 1. Hyperkalemia and Pulmonary Edema secondary to ESRD on Dialysis  2. SVT  Discharge Medications: Allergies as of 07/19/2018      Reactions   Tobramycin Sulfate Swelling      Medication List    STOP taking these medications   ALPRAZolam 0.5 MG tablet Commonly known as:  XANAX   hydrALAZINE 25 MG tablet Commonly known as:  APRESOLINE   isosorbide mononitrate 30 MG 24 hr tablet Commonly known as:  IMDUR     TAKE these medications   acetaminophen 500 MG tablet Commonly known as:  TYLENOL Take 1,000 mg by mouth 2 (two) times daily.   albuterol 108 (90 Base) MCG/ACT inhaler Commonly known as:  PROVENTIL HFA;VENTOLIN HFA Inhale 1-2 puffs into the lungs every 6 (six) hours as needed for wheezing or shortness of breath.   AURYXIA 1 GM 210 MG(Fe) tablet Generic drug:  ferric citrate Take 210-420 mg by mouth See admin instructions. Take 2 tablets (420 mg) by mouth with meals and 1 tablet (210 mg) with snacks. Do not chew or crush medication. Swallow whole.   calcium acetate 667 MG capsule Commonly known as:  PHOSLO Take 2,001-4,002 mg by mouth See admin instructions. Take 6 capsule (4,002 mg) by mouth three times a day with each meal and 3 capsules (2,001 mg) with each snack   carvedilol 6.25 MG tablet Commonly known as:  COREG Take 1 tablet (6.25 mg total) by mouth 2 (two) times daily with a meal. What changed:    medication strength  how much to take   cinacalcet 30 MG tablet Commonly known as:  SENSIPAR Take 1 tablet (30 mg total) by mouth daily with supper.   darbepoetin 60 MCG/0.3ML Soln injection Commonly known as:  ARANESP Inject 0.3 mLs (60 mcg total) into the vein every Wednesday with hemodialysis. What changed:  when to take this     diltiazem 30 MG tablet Commonly known as:  CARDIZEM Take 1 tablet (30 mg total) by mouth every 6 (six) hours as needed (SVT).   diphenhydrAMINE 25 MG tablet Commonly known as:  BENADRYL Take 25-50 mg by mouth See admin instructions. Take one tablet (25 mg) by mouth twice daily on Sunday, Monday, Wednesday, Friday, take two tablets (50 mg) once daily at dialysis on Tuesday, Thursday, Saturday.   ferric gluconate 62.5 mg in sodium chloride 0.9 % 100 mL Inject 62.5 mg into the vein every Thursday with hemodialysis.   hydroxychloroquine 200 MG tablet Commonly known as:  PLAQUENIL Take 1 tablet (200 mg total) by mouth daily. What changed:  how much to take   loperamide 2 MG tablet Commonly known as:  IMODIUM A-D Take 2 mg by mouth See admin instructions. Take one tablet (2 mg) by mouth on dialysis days (Tuesday, Thursday, Saturday)   multivitamin Tabs tablet Take 1 tablet by mouth daily. What changed:  when to take this   NOVASOURCE RENAL Liqd Take 237 mLs by mouth See admin instructions. Take 1 container (237 mls) by mouth daily on dialysis days (Tuesday, Thursday, Saturday) - either during or after treatment       Disposition and follow-up:   [name] was discharged from [facility] in [place] condition.  At the hospital follow up visit please address:  1.  Lupus:  Needs a PCP and Rheumatologist to manage her SLE; referral was made for rheumatology- she was initiated on Plaquenil 200 mg on discharge- will need an eye exam in several months   SVT HTN: --Coreg increased to 6.25mg  BID by cards, with prn diltiazem 30mg  q6hr prn for symptoms of SVT --ensure she follows up with cardiology [facility])  --patient has been off of hydralazine and isosorbide for some time; assess for ability to restart in setting of h/o combined CHF; she was normotensive after adequate HD, however euvolemia seems to be difficult to achieve outpatient.  2.  Labs / imaging needed at time  of follow-up: none  3.  Pending labs/ test needing follow-up: none  Follow-up Appointments: Follow-up Information    Lennette Bihari, MD. Schedule an appointment as soon as possible for a visit in 1 week.   Specialty:  Cardiology Contact information: [address] [place] [address] [phone]           Hospital Course by problem list: [name] is a 37 yoF with a PMH of ESRD on dialysis T,Th, Sat, SLE, HrEF and polysubstance use who presents with SVT during dialysis sessions, causing them to be cut short.  1. ESRD on Dialysis with Pulmonary Edema:  On admission the patient had had a week of dialysis sessions cut short because she would go into SVT during the session. At this time she was volume over loaded and 7.7 kg above her estimated dry weight per Nephrology with Pulmonary Edema evident on exam. Initiation of Diltiazem the night before dialysis and increasing the dose of Carvedilol helped alleviate this issue and the patient was able to tolerate dialysis during her inpatient stay. She sat through two dialysis sessions. During these sessions 7 L of fluid were dialyzed off and her weight on discharge was 48.9kg, down 5.8 kg from her admission weight. She was found to be hyperkalemic to 6.4 on admission, given Kayexalate and on discharge, after two dialysis sessions, this had resolved with a K of 4.6. The patient did not have evident pulmonary edema on discharge and was not in respiratory distress. She will need dialysis as an outpatient this Thursday as scheduled.   2.  SVT:  On admission, the patient could not compete a dialysis session secondary to SVT that would begin midway through treatment. The patient was initiated on Diltiazem on the nights before dialysis and immediately after treatment and had her dose of Carvedilol increased to 6.25. Cardiology also started PRN diltiazem 30mg  q6hr prn in case she were to have symptoms of SVT. She will follow up with  cardiology outpatient. She was able to tolerate two dialysis sessions as an inpatient without any runs of  SVT. She was monitored on telemetry during admission and this rhythm was not observed.    3. Hypertension/ history of CHF (last EF 45%):  The patient remained hypertensive to the 150s for much of the admission. During this time she was volume overloaded with significant pulmonary edema, likely contributing to her Bps. She was managed with an increased dose of Carvedolo and dialysis to affect her volume status. After two rounds of dialysis, the patient had stable but normotensive Bps. She will require further titration of medications and possible initiation of hydralazine and isosorbide to her regimen.  4. 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 We consulted care management to set up  the patient with outpatient follow up through [facility]. The patient was also initiated on Plaquenil 200 mg this admission and will need outpatient follow up to manage this drug and ensure that she completes an eye exam.    Discharge Vitals:   BP 116/74 (BP Location: Right Arm)   Pulse 86   Temp 97.7 F (36.5 C) (Oral)   Resp 16   Ht 5\' 4"  (1.626 m)   Wt 48.9 kg (107 lb 12.9 oz)   SpO2 98%   BMI 18.50 kg/m    Pertinent Labs, Studies, and Procedures:  07/16/18 Chest X-ray  Stable cardiomegaly and patchy chronic interstitial disease in both lungs with mildly improved left basilar pleural and parenchymal scarring.  07/16/18 EKG Multiple premature complexes with chronic LVH and a boarderline prolonged QT interval   CBC Latest Ref Rng & Units 07/19/2018 07/17/2018 07/16/2018  WBC 4.0 - 10.5 K/uL 3.4(L) 4.2 4.8  Hemoglobin 12.0 - 15.0 g/dL 8.2(N) 5.6(O) 1.3(Y)  Hematocrit 36.0 - 46.0 % 32.4(L) 27.9(L) 29.0(L)  Platelets 150 - 400 K/uL 270 243 269   BMP Latest Ref Rng & Units  07/19/2018 07/18/2018 07/17/2018  Glucose 70 - 99 mg/dL 91 86(V) 90  BUN 6 - 20 mg/dL 78(I) 69(G) 29(B)  Creatinine 0.44 - 1.00 mg/dL 2.84(X) 3.24(M) 01.02(V)  Sodium 135 - 145 mmol/L 137 137 140  Potassium 3.5 - 5.1 mmol/L 4.6 4.7 6.1(H)  Chloride 98 - 111 mmol/L 99 98 100  CO2 22 - 32 mmol/L 27 29 26   Calcium 8.9 - 10.3 mg/dL 2.5(D) 6.6(Y) 9.0    Discharge Instructions: Discharge Instructions    Ambulatory referral to Rheumatology   Complete by:  As directed    Lupus, in remission.   Call MD for:  difficulty breathing, headache or visual disturbances   Complete by:  As directed    Call MD for:  extreme fatigue   Complete by:  As directed    Call MD for:  hives   Complete by:  As directed    Call MD for:  persistant dizziness or light-headedness   Complete by:  As directed    Call MD for:  persistant nausea and vomiting   Complete by:  As directed    Call MD for:  redness, tenderness, or signs of infection (pain, swelling, redness, odor or green/yellow discharge around incision site)   Complete by:  As directed    Call MD for:  severe uncontrolled pain   Complete by:  As directed    Call MD for:  temperature >100.4   Complete by:  As directed    Diet - low sodium heart healthy   Complete by:  As directed    Discharge instructions   Complete by:  As directed    Please continue your regular dialysis schedule.   For your SVT (high heart rate episodes) we increased your carvedilol 6.25mg  twice daily; you were also prescribed as needed diltiazem 30mg  if you feel like you are starting to have an episode. Please call the cardiology office to schedule an appointment.   Increase activity slowly   Complete by:  As directed       Signed: Lindie Spruce, Medical Student 07/19/2018, 4:33 PM    Attestation for Student Documentation:  I personally was present and performed or re-performed the history, physical exam and medical decision-making activities of this service and have  verified that the service and findings are accurately documented in the student's note.  Koni Kannan,  Candida Peeling, MD 07/25/2018, 11:08 AM

## 2018-07-19 NOTE — Progress Notes (Signed)
Pt given avs and reviewed med changes, appts. And when to call md, pt verbaized understanding, all questions and entertained , dc home stable in wheelchair

## 2018-07-29 ENCOUNTER — Observation Stay (HOSPITAL_COMMUNITY)
Admission: EM | Admit: 2018-07-29 | Discharge: 2018-07-30 | Disposition: A | Discharge status: Home / Self Care | Payer: Medicaid Other | Attending: Internal Medicine | Admitting: Internal Medicine

## 2018-07-29 ENCOUNTER — Encounter (HOSPITAL_COMMUNITY): Payer: Self-pay | Admitting: *Deleted

## 2018-07-29 ENCOUNTER — Emergency Department (HOSPITAL_COMMUNITY): Payer: Medicaid Other

## 2018-07-29 DIAGNOSIS — I5042 Chronic combined systolic (congestive) and diastolic (congestive) heart failure: Secondary | ICD-10-CM | POA: Diagnosis not present

## 2018-07-29 DIAGNOSIS — R0602 Shortness of breath: Secondary | ICD-10-CM | POA: Insufficient documentation

## 2018-07-29 DIAGNOSIS — Z992 Dependence on renal dialysis: Secondary | ICD-10-CM | POA: Diagnosis not present

## 2018-07-29 DIAGNOSIS — I132 Hypertensive heart and chronic kidney disease with heart failure and with stage 5 chronic kidney disease, or end stage renal disease: Secondary | ICD-10-CM | POA: Diagnosis not present

## 2018-07-29 DIAGNOSIS — Z881 Allergy status to other antibiotic agents status: Secondary | ICD-10-CM | POA: Diagnosis not present

## 2018-07-29 DIAGNOSIS — M329 Systemic lupus erythematosus, unspecified: Secondary | ICD-10-CM

## 2018-07-29 DIAGNOSIS — Z8269 Family history of other diseases of the musculoskeletal system and connective tissue: Secondary | ICD-10-CM

## 2018-07-29 DIAGNOSIS — I471 Supraventricular tachycardia, unspecified: Secondary | ICD-10-CM | POA: Diagnosis present

## 2018-07-29 DIAGNOSIS — J90 Pleural effusion, not elsewhere classified: Secondary | ICD-10-CM | POA: Insufficient documentation

## 2018-07-29 DIAGNOSIS — N186 End stage renal disease: Secondary | ICD-10-CM | POA: Diagnosis not present

## 2018-07-29 DIAGNOSIS — R011 Cardiac murmur, unspecified: Secondary | ICD-10-CM | POA: Diagnosis not present

## 2018-07-29 DIAGNOSIS — F17210 Nicotine dependence, cigarettes, uncomplicated: Secondary | ICD-10-CM | POA: Insufficient documentation

## 2018-07-29 DIAGNOSIS — D649 Anemia, unspecified: Secondary | ICD-10-CM

## 2018-07-29 DIAGNOSIS — Z720 Tobacco use: Secondary | ICD-10-CM

## 2018-07-29 DIAGNOSIS — E875 Hyperkalemia: Secondary | ICD-10-CM

## 2018-07-29 DIAGNOSIS — E8779 Other fluid overload: Secondary | ICD-10-CM | POA: Diagnosis not present

## 2018-07-29 DIAGNOSIS — Z79899 Other long term (current) drug therapy: Secondary | ICD-10-CM | POA: Diagnosis not present

## 2018-07-29 DIAGNOSIS — M3214 Glomerular disease in systemic lupus erythematosus: Secondary | ICD-10-CM | POA: Diagnosis not present

## 2018-07-29 DIAGNOSIS — E8770 Fluid overload, unspecified: Secondary | ICD-10-CM | POA: Diagnosis not present

## 2018-07-29 DIAGNOSIS — I340 Nonrheumatic mitral (valve) insufficiency: Secondary | ICD-10-CM

## 2018-07-29 LAB — BASIC METABOLIC PANEL
ANION GAP: 16 — AB (ref 5–15)
Anion gap: 19 — ABNORMAL HIGH (ref 5–15)
BUN: 103 mg/dL — ABNORMAL HIGH (ref 6–20)
BUN: 64 mg/dL — ABNORMAL HIGH (ref 6–20)
CALCIUM: 8.8 mg/dL — AB (ref 8.9–10.3)
CALCIUM: 8.5 mg/dL — AB (ref 8.9–10.3)
CHLORIDE: 98 mmol/L (ref 98–111)
CO2: 19 mmol/L — AB (ref 22–32)
CO2: 24 mmol/L (ref 22–32)
CREATININE: 13.93 mg/dL — AB (ref 0.44–1.00)
CREATININE: 9.55 mg/dL — AB (ref 0.44–1.00)
Chloride: 101 mmol/L (ref 98–111)
GFR calc non Af Amer: 3 mL/min — ABNORMAL LOW (ref >60)
GFR calc non Af Amer: 5 mL/min — ABNORMAL LOW (ref >60)
GFR, EST AFRICAN AMERICAN: 3 mL/min — AB (ref >60)
GFR, EST AFRICAN AMERICAN: 5 mL/min — AB (ref >60)
Glucose, Bld: 95 mg/dL (ref 70–99)
Glucose, Bld: 103 mg/dL — ABNORMAL HIGH (ref 70–99)
Potassium: 6.6 mmol/L (ref 3.5–5.1)
Potassium: 3.9 mmol/L (ref 3.5–5.1)
SODIUM: 139 mmol/L (ref 135–145)
SODIUM: 138 mmol/L (ref 135–145)

## 2018-07-29 LAB — CBC
HCT: 31.9 % — ABNORMAL LOW (ref 36.0–46.0)
Hemoglobin: 9.3 g/dL — ABNORMAL LOW (ref 12.0–15.0)
MCH: 28.9 pg (ref 26.0–34.0)
MCHC: 29.2 g/dL — ABNORMAL LOW (ref 30.0–36.0)
MCV: 99.1 fL (ref 78.0–100.0)
PLATELETS: 223 10*3/uL (ref 150–400)
RBC: 3.22 MIL/uL — AB (ref 3.87–5.11)
RDW: 20.9 % — AB (ref 11.5–15.5)
WBC: 8 10*3/uL (ref 4.0–10.5)

## 2018-07-29 LAB — I-STAT TROPONIN, ED: TROPONIN I, POC: 0.05 ng/mL (ref 0.00–0.08)

## 2018-07-29 LAB — MAGNESIUM: Magnesium: 2.1 mg/dL (ref 1.7–2.4)

## 2018-07-29 MED ORDER — FERRIC CITRATE 1 GM 210 MG(FE) PO TABS: 210.0000 mg | ORAL_TABLET | ORAL | Status: DC

## 2018-07-29 MED ORDER — SODIUM CHLORIDE 0.9 % IV SOLN: 100.0000 mL | INTRAVENOUS | Status: DC | PRN

## 2018-07-29 MED ORDER — CARVEDILOL 6.25 MG PO TABS
6.2500 mg | ORAL_TABLET | Freq: Two times a day (BID) | ORAL | Status: DC
  Administered 2018-07-30: 6.25 mg via ORAL
  Filled 2018-07-29 (×3): qty 1

## 2018-07-29 MED ORDER — HYDROXYCHLOROQUINE SULFATE 200 MG PO TABS
200.0000 mg | ORAL_TABLET | Freq: Every day | ORAL | Status: DC
  Administered 2018-07-30: 200 mg via ORAL
  Filled 2018-07-29: qty 1

## 2018-07-29 MED ORDER — LIDOCAINE HCL (PF) 1 % IJ SOLN: 5.0000 mL | INTRAMUSCULAR | Status: DC | PRN

## 2018-07-29 MED ORDER — LOPERAMIDE HCL 2 MG PO CAPS
2.0000 mg | ORAL_CAPSULE | Freq: Once | ORAL | Status: AC
  Administered 2018-07-30: 2 mg via ORAL

## 2018-07-29 MED ORDER — RENA-VITE PO TABS
1.0000 | ORAL_TABLET | Freq: Every day | ORAL | Status: DC
  Administered 2018-07-30: 1 via ORAL
  Filled 2018-07-29: qty 1

## 2018-07-29 MED ORDER — DILTIAZEM HCL 30 MG PO TABS: 30.0000 mg | ORAL_TABLET | Freq: Four times a day (QID) | ORAL | Status: DC | PRN

## 2018-07-29 MED ORDER — ACETAMINOPHEN 325 MG PO TABS
650.0000 mg | ORAL_TABLET | Freq: Four times a day (QID) | ORAL | Status: DC | PRN
  Administered 2018-07-30: 650 mg via ORAL

## 2018-07-29 MED ORDER — DILTIAZEM HCL 30 MG PO TABS
30.0000 mg | ORAL_TABLET | Freq: Four times a day (QID) | ORAL | Status: DC | PRN
  Administered 2018-07-29 – 2018-07-30 (×2): 30 mg via ORAL
  Filled 2018-07-29 (×3): qty 1

## 2018-07-29 MED ORDER — HEPARIN SODIUM (PORCINE) 1000 UNIT/ML DIALYSIS
20.0000 [IU]/kg | INTRAMUSCULAR | Status: DC | PRN
  Filled 2018-07-29: qty 1

## 2018-07-29 MED ORDER — HYDROXYCHLOROQUINE SULFATE 200 MG PO TABS: 200.0000 mg | ORAL_TABLET | Freq: Every day | ORAL | Status: DC

## 2018-07-29 MED ORDER — HEPARIN SODIUM (PORCINE) 5000 UNIT/ML IJ SOLN: 5000.0000 [IU] | Freq: Three times a day (TID) | INTRAMUSCULAR | Status: DC

## 2018-07-29 MED ORDER — DIPHENHYDRAMINE HCL 25 MG PO CAPS: 50.0000 mg | ORAL_CAPSULE | ORAL | Status: DC

## 2018-07-29 MED ORDER — CHLORHEXIDINE GLUCONATE CLOTH 2 % EX PADS: 6.0000 | MEDICATED_PAD | Freq: Every day | CUTANEOUS | Status: DC

## 2018-07-29 MED ORDER — DIPHENHYDRAMINE HCL 25 MG PO TABS
25.0000 mg | ORAL_TABLET | ORAL | Status: DC
Start: 2018-07-29 — End: 2018-07-29

## 2018-07-29 MED ORDER — LIDOCAINE-PRILOCAINE 2.5-2.5 % EX CREA
1.0000 "application " | TOPICAL_CREAM | CUTANEOUS | Status: DC | PRN
  Filled 2018-07-29: qty 5

## 2018-07-29 MED ORDER — CINACALCET HCL 30 MG PO TABS: 30.0000 mg | ORAL_TABLET | Freq: Every day | ORAL | Status: DC

## 2018-07-29 MED ORDER — ACETAMINOPHEN 650 MG RE SUPP: 650.0000 mg | Freq: Four times a day (QID) | RECTAL | Status: DC | PRN

## 2018-07-29 MED ORDER — HEPARIN SODIUM (PORCINE) 1000 UNIT/ML DIALYSIS
1000.0000 [IU] | INTRAMUSCULAR | Status: DC | PRN
  Filled 2018-07-29: qty 1

## 2018-07-29 MED ORDER — ALTEPLASE 2 MG IJ SOLR: 2.0000 mg | Freq: Once | INTRAMUSCULAR | Status: DC | PRN

## 2018-07-29 MED ORDER — PENTAFLUOROPROP-TETRAFLUOROETH EX AERO
1.0000 "application " | INHALATION_SPRAY | CUTANEOUS | Status: DC | PRN
  Filled 2018-07-29: qty 103.5

## 2018-07-29 MED ORDER — DIPHENHYDRAMINE HCL 25 MG PO CAPS: 25.0000 mg | ORAL_CAPSULE | ORAL | Status: DC

## 2018-07-29 NOTE — Progress Notes (Signed)
I noticed in the afternoon that patient was in the ER c/o SOB and K was 6.6.  She had missed her regular HD at [facility] on Thursday and called today was told there were not spots.  The plan was to run her dialysis in the hospital and send home after.  Unfortunately pt went into SVT during her HD and it needed to be stopped after one hour and 10 minutes.  Her K is now normal but she is still c/o SOB.  Her SVT resolved quickly without intervention.  I discussed with ER MD.  I think the safest thing would be to have her admitted to observation overnight and we can do her routine HD tomorrow- if no events could be discharged after HD  Jyron Turman A

## 2018-07-29 NOTE — ED Provider Notes (Signed)
[facility] EMERGENCY DEPARTMENT Provider Note   CSN: [id number] Arrival date & time: 07/29/18  1457     History   Chief Complaint Chief Complaint  Patient presents with  . Shortness of Breath    HPI [name] is a 37 y.o. female.  HPI   37 yo F with PMHx ESRD here with SOB. Pt was seen earlier with severe shortness of breath.  She is found to be hypoxic, tachypnea, and flash edema with hyperkalemia.  She was taken emergently to dialysis.  She has a history of SVT and forgot to take her diltiazem prior to dialysis.  During the session, she is set for approximately 1 hour then developed SVT and had to come off the machine.  She states she continues to feel short of breath but does feel improved from prior to dialysis.  Denies any cramping.  Denies any chest pain currently.  She has a history of SVT as mentioned.  She has not missed any medications other than her diltiazem dose, which she was given after she came off the machine.  No current chest pain, shortness of breath, abdominal pain, nausea, vomiting.  No other complaints.  Past Medical History:  Diagnosis Date  . Anemia    low iron - receives iron at dialysis  . Chronic systolic congestive heart failure ([place]) 03/16/2016  . Dyspnea   . ESRD (end stage renal disease) ([place])    [facility]emo TTHSAT _ [place]  . H/O pericarditis 01/17/2013  . H/O pleural effusion 01/17/2013  . Heart murmur   . Lupus (systemic lupus erythematosus) (HCC)    Previously followed with Dr. Charlestine Night, has not followed up recently  . 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 Pneumonia    many times  . Polysubstance abuse ([place])    cocaine, MJ, tobacco  . S/P pericardiocentesis 01/17/2013   H/o pericardial effusion with tamponade 2006   . Seizures ([place])    during pregnancy 1 time  .  Streptococcal bacteremia 01/23/2013   She had two S. pneumonae bacteremia on 01/21/2013. Sensitive to Peniccilin     Patient Active Problem List   Diagnosis Date Noted  . Hyperkalemia 07/29/2018  . SVT (supraventricular tachycardia) ([place]) 07/16/2018  . ESRD (end stage renal disease) on dialysis ([place]) 02/17/2017  . Chronic systolic congestive heart failure ([place]) 03/16/2016  . Other hypervolemia   . S/P thoracentesis   . Cough with hemoptysis   . Myalgia   . Pulmonary edema   . Rash and nonspecific skin eruption 06/26/2015  . Secondary Raynaud's phenomenon 06/24/2015  . Cramping of hands 04/19/2014  . Unspecified contraceptive management 04/19/2014  . End stage renal disease ([place]) 03/27/2014  . Insomnia 03/15/2014  . Benign hypertension with ESRD (end-stage renal disease) ([place]) 03/15/2014  . Tobacco abuse 02/15/2014  . Healthcare maintenance 02/15/2014  . Hypoalbuminemia 02/01/2014  . Nephrotic syndrome 02/01/2014  . ESRD on dialysis ([place]) 01/31/2014  . Pleural effusion, left 01/31/2014  . Microcytic anemia 01/29/2014  . Hypocalcemia 01/29/2014  . Cocaine abuse ([place]) 01/18/2013  . Marijuana smoker ([place]) 01/18/2013  . H/O pericarditis 01/17/2013  . SLE (systemic lupus erythematosus) ([place]) 01/17/2013  . Lupus nephritis ([place]) 01/17/2013  . S/P pericardiocentesis 01/17/2013  . H/O pleural effusion 01/17/2013  . Nephrosis 01/17/2013  . Preseptal cellulitis 01/17/2013    Past Surgical History:  Procedure Laterality  Date  . AV FISTULA PLACEMENT    . BASCILIC VEIN TRANSPOSITION Left 02/05/2014   Procedure: [place];  Surgeon: [name], MD;  Location: [facility];  Service: Vascular;  Laterality: Left;  . FISTULA SUPERFICIALIZATION Left 05/30/2018   Procedure: FISTULA PLICATION BASILIC VEIN TRANSPOSITION;  Surgeon: Angelia Mould, MD;  Location: [facility];  Service: Vascular;  Laterality: Left;  Marland Kitchen VENOGRAM Right 01/31/2014   Procedure: DIALYSIS CATHETER;  Surgeon:  Serafina Mitchell, MD;  Location: [facility] CATH LAB;  Service: Cardiovascular;  Laterality: Right;     OB History   None      Home Medications    Prior to Admission medications   Medication Sig Start Date End Date Taking? Authorizing Provider  acetaminophen (TYLENOL) 500 MG tablet Take 1,000 mg by mouth 2 (two) times daily.   Yes [provider]  albuterol (PROVENTIL HFA;VENTOLIN HFA) 108 (90 Base) MCG/ACT inhaler Inhale 1-2 puffs into the lungs every 6 (six) hours as needed for wheezing or shortness of breath. 02/03/16  Yes Lucious Groves, DO  calcium acetate (PHOSLO) 667 MG capsule Take 2,001-4,002 mg by mouth See admin instructions. Take 6 capsule (4,002 mg) by mouth three times a day with each meal and 3 capsules (2,001 mg) with each snack   Yes [provider]  carvedilol (COREG) 6.25 MG tablet Take 1 tablet (6.25 mg total) by mouth 2 (two) times daily with a meal. 07/19/18  Yes Alphonzo Grieve, MD  cinacalcet (SENSIPAR) 30 MG tablet Take 1 tablet (30 mg total) by mouth daily with supper. 07/19/18  Yes Alphonzo Grieve, MD  darbepoetin (ARANESP) 60 MCG/0.3ML SOLN injection Inject 0.3 mLs (60 mcg total) into the vein every Wednesday with hemodialysis. Patient taking differently: Inject 60 mcg into the vein every Saturday.  02/08/14  Yes Jessee Avers, MD  diltiazem (CARDIZEM) 30 MG tablet Take 1 tablet (30 mg total) by mouth every 6 (six) hours as needed (SVT). 07/19/18  Yes Alphonzo Grieve, MD  diphenhydrAMINE (BENADRYL) 25 MG tablet Take 25-50 mg by mouth See admin instructions. Take one tablet (25 mg) by mouth twice daily on Sunday, Monday, Wednesday, Friday, take two tablets (50 mg) once daily at dialysis on Tuesday, Thursday, Saturday.   Yes [provider]  ferric citrate (AURYXIA) 1 GM 210 MG(Fe) tablet Take 210-420 mg by mouth See admin instructions. Take 2 tablets (420 mg) by mouth with meals and 1 tablet (210 mg) with snacks. Do not chew or crush medication.  Swallow whole.   Yes [provider]  ferric gluconate 62.5 mg in sodium chloride 0.9 % 100 mL Inject 62.5 mg into the vein every Thursday with hemodialysis. 03/09/16  Yes Rice, Resa Miner, MD  hydroxychloroquine (PLAQUENIL) 200 MG tablet Take 1 tablet (200 mg total) by mouth daily. 07/19/18  Yes Alphonzo Grieve, MD  loperamide (IMODIUM A-D) 2 MG tablet Take 2 mg by mouth See admin instructions. Take one tablet (2 mg) by mouth on dialysis days (Tuesday, Thursday, Saturday)   Yes [provider]  multivitamin (RENA-VIT) TABS tablet Take 1 tablet by mouth daily. Patient taking differently: Take 1 tablet by mouth at bedtime.  02/18/17  Yes Holley Raring, MD  Nutritional Supplements (NOVASOURCE RENAL) LIQD Take 237 mLs by mouth See admin instructions. Take 1 container (237 mls) by mouth daily on dialysis days (Tuesday, Thursday, Saturday) - either during or after treatment   Yes [provider]    Family History History reviewed. No pertinent family  history.  Social History Social History   Tobacco Use  . Smoking status: Current Every Day Smoker    Packs/day: 0.12    Years: 15.00    Pack years: 1.80    Types: Cigarettes  . Smokeless tobacco: Never Used  . Tobacco comment: quit smoking s/p hospital discharge/ SMOKES ABOUT 2-3 CIGARETTES A DAY  Substance Use Topics  . Alcohol use: Yes    Alcohol/week: 0.0 oz    Comment: Special Occasional  . Drug use: Yes    Frequency: 5.0 times per week    Types: Marijuana, Cocaine    Comment: Marijuana last time mid May 2019, Cocaine- last tile mid April 2019     Allergies   Tobramycin sulfate   Review of Systems Review of Systems  Constitutional: Positive for fatigue. Negative for chills and fever.  HENT: Negative for congestion and rhinorrhea.   Eyes: Negative for visual disturbance.  Respiratory: Positive for cough and shortness of breath. Negative for wheezing.   Cardiovascular: Negative for chest pain and leg  swelling.  Gastrointestinal: Negative for abdominal pain, diarrhea, nausea and vomiting.  Genitourinary: Negative for dysuria and flank pain.  Musculoskeletal: Negative for neck pain and neck stiffness.  Skin: Negative for rash and wound.  Allergic/Immunologic: Negative for immunocompromised state.  Neurological: Positive for weakness. Negative for syncope and headaches.  All other systems reviewed and are negative.    Physical Exam Updated Vital Signs BP 135/86   Pulse 92   Temp 98 F (36.7 C) (Oral)   Resp (!) 22   SpO2 93%   Physical Exam  Constitutional: She is oriented to person, place, and time. She appears well-developed and well-nourished. No distress.  HENT:  Head: Normocephalic and atraumatic.  Eyes: Conjunctivae are normal.  Neck: Neck supple.  Cardiovascular: Normal rate, regular rhythm and normal heart sounds. Exam reveals no friction rub.  No murmur heard. Pulmonary/Chest: Effort normal. Tachypnea noted. No respiratory distress. She has decreased breath sounds. She has no wheezes. She has rales in the right upper field, the right middle field, the right lower field, the left upper field, the left middle field and the left lower field.  Abdominal: She exhibits no distension.  Musculoskeletal:       Right lower leg: She exhibits edema.       Left lower leg: She exhibits edema.  Neurological: She is alert and oriented to person, place, and time. She exhibits normal muscle tone.  Skin: Skin is warm. Capillary refill takes less than 2 seconds.  Psychiatric: She has a normal mood and affect.  Nursing note and vitals reviewed.    ED Treatments / Results  Labs (all labs ordered are listed, but only abnormal results are displayed) Labs Reviewed  BASIC METABOLIC PANEL - Abnormal; Notable for the following components:      Result Value   Potassium 6.6 (*)    CO2 19 (*)    BUN 103 (*)    Creatinine, Ser 13.93 (*)    Calcium 8.8 (*)    GFR calc non Af Amer 3 (*)      GFR calc Af Amer 3 (*)    Anion gap 19 (*)    All other components within normal limits  CBC - Abnormal; Notable for the following components:   RBC 3.22 (*)    Hemoglobin 9.3 (*)    HCT 31.9 (*)    MCHC 29.2 (*)    RDW 20.9 (*)    All other components within normal  limits  BASIC METABOLIC PANEL - Abnormal; Notable for the following components:   Glucose, Bld 103 (*)    BUN 64 (*)    Creatinine, Ser 9.55 (*)    Calcium 8.5 (*)    GFR calc non Af Amer 5 (*)    GFR calc Af Amer 5 (*)    Anion gap 16 (*)    All other components within normal limits  MAGNESIUM  I-STAT TROPONIN, ED    EKG EKG Interpretation  Date/Time:  Friday July 29 2018 15:06:12 EDT Ventricular Rate:  106 PR Interval:  194 QRS Duration: 90 QT Interval:  360 QTC Calculation: 478 R Axis:   119 Text Interpretation:  Sinus tachycardia Possible Left atrial enlargement Left posterior fascicular block Abnormal ECG peaked t waves Confirmed by Deno Etienne [phone]) on 07/29/2018 4:46:16 PM   Radiology Dg Chest 2 View  Result Date: 07/29/2018 CLINICAL DATA:  37 year old female missed dialysis yesterday. Cough, hemoptysis, shortness of breath. EXAM: CHEST - 2 VIEW COMPARISON:  Portable chest 07/16/2018 and earlier. FINDINGS: Chronic cardiomegaly. Multifocal patchy and confluent bilateral perihilar opacity. Upper lobe predominance. Mixed interstitial and airspace appearance. Small bilateral pleural effusions may be increased since July. Visualized tracheal air column is within normal limits. No pneumothorax. No acute osseous abnormality identified. Negative visible bowel gas pattern. IMPRESSION: 1. Widespread bilateral upper lobe predominant opacity with mixed interstitial and airspace appearance. 2. Chronic cardiomegaly. Small bilateral pleural effusions which may have increased since July. 3. Favor acute asymmetric pulmonary edema. Differential considerations include bilateral pneumonia, pulmonary vasculitis. Electronically  Signed   By: Genevie Ann M.D.   On: 07/29/2018 15:57    Procedures Procedures (including critical care time)  Medications Ordered in ED Medications  Chlorhexidine Gluconate Cloth 2 % PADS 6 each (has no administration in time range)  diltiazem (CARDIZEM) tablet 30 mg (30 mg Oral Given 07/29/18 1906)  loperamide (IMODIUM) capsule 2 mg (has no administration in time range)     Initial Impression / Assessment and Plan / ED Course  I have reviewed the triage vital signs and the nursing notes.  Pertinent labs & imaging results that were available during my care of the patient were reviewed by me and considered in my medical decision making (see chart for details).     37 year old female here with persistent dyspnea after being unable to sit for a full dialysis session.  She appears to be back in sinus rhythm now, but remains hypervolemic with tachypnea and rales on exam.  Discussed with Dr. Moshe Cipro of nephrology, patient will be admitted for observation and likely repeat dialysis in the morning.  Patient will need to be given another dose of diltiazem prior to dialysis session.  Repeat electrolytes show improvement.  Final Clinical Impressions(s) / ED Diagnoses   Final diagnoses:  Other hypervolemia  Hyperkalemia  ESRD needing dialysis [facility])    ED Discharge Orders    None       Duffy Bruce, MD 07/29/18 2212

## 2018-07-29 NOTE — ED Notes (Signed)
Pt ambulated to the restroom and back. After sitting back down in bed pt called out and stated "i'm in svt, I have that roller coaster feeling" pt's HR was 120 for a short time with pvc's, then down to 104 and irregular. Pt stated that she felt better as hr went down. EKG captured and went to Dr Ellender Hose.

## 2018-07-29 NOTE — H&P (Addendum)
Date: 07/30/2018               Patient Name:  [name] MRN: [id number]  DOB: Jul 03, 1981 Age / Sex: 37 y.o., female   PCP: Patient, No Pcp Per         Medical Service: Internal Medicine Teaching Service         Attending Physician: Dr. Oval Linsey, MD    First Contact: Dr. Molli Hazard, DO Pager: [phone]  Second Contact: Dr. Einar Gip, DO Pager: [phone]       After Hours (After 5p/  First Contact Pager: [phone]  weekends / holidays): Second Contact Pager: [phone]   Chief Complaint: Shortness of breath  History of Present Illness: 37 y/o female with PMH of ESRD on HD T/TH/Sat, SLE, CHF. Presented with sever shortness of breath since the AM before admission. She missed her hemodialysis session on thursday. Next day, (On the day of admission), she woke up with sever shortness of breath and came to ED. Denies any nausea, vomiting. No chest pain and no Hx of leg swelling. No cough, fever or chills. In ED, she found to have tachypnea and was reportedly hypoxic, also found to have  K at 6.6. BUN: 103.  Spit up some foamy bloody secretions. CXR: showed diffuse interstitial infiltration she was With flash pulmonary edeman with She started with emergency dialysis at hospital,  But after 1 hour and 10 minutes of hemodialysis, she developed SVT and  HD stopped.  Took Diltiazem PRN and before HD sessions.That was not taken today. SVT resolved quickly, she put back on Diltiazem. She had Hx of recent admission for similar situation. She admitted to internal medicine teaching service to be observed overnight and ger her HD tomorrow. Still complains of some shortness of breath. No chest pain, nausea, vomiting or abdominal cramp. K came down to 3.9 at time of admission.   Meds:  Current Meds  Medication Sig  . acetaminophen (TYLENOL) 500 MG tablet Take 1,000 mg by mouth 2 (two) times daily.  Marland Kitchen albuterol (PROVENTIL HFA;VENTOLIN HFA) 108 (90 Base) MCG/ACT inhaler Inhale 1-2 puffs  into the lungs every 6 (six) hours as needed for wheezing or shortness of breath.  . calcium acetate (PHOSLO) 667 MG capsule Take 2,001-4,002 mg by mouth See admin instructions. Take 6 capsule (4,002 mg) by mouth three times a day with each meal and 3 capsules (2,001 mg) with each snack  . carvedilol (COREG) 6.25 MG tablet Take 1 tablet (6.25 mg total) by mouth 2 (two) times daily with a meal.  . cinacalcet (SENSIPAR) 30 MG tablet Take 1 tablet (30 mg total) by mouth daily with supper.  . darbepoetin (ARANESP) 60 MCG/0.3ML SOLN injection Inject 0.3 mLs (60 mcg total) into the vein every Wednesday with hemodialysis. (Patient taking differently: Inject 60 mcg into the vein every Saturday. )  . diltiazem (CARDIZEM) 30 MG tablet Take 1 tablet (30 mg total) by mouth every 6 (six) hours as needed (SVT).  Marland Kitchen diphenhydrAMINE (BENADRYL) 25 MG tablet Take 25-50 mg by mouth See admin instructions. Take one tablet (25 mg) by mouth twice daily on Sunday, Monday, Wednesday, Friday, take two tablets (50 mg) once daily at dialysis on Tuesday, Thursday, Saturday.  . ferric citrate (AURYXIA) 1 GM 210 MG(Fe) tablet Take 210-420 mg by mouth See admin instructions. Take 2 tablets (420 mg) by mouth with meals and 1 tablet (210 mg) with snacks. Do not chew or crush medication. Swallow whole.  Marland Kitchen  ferric gluconate 62.5 mg in sodium chloride 0.9 % 100 mL Inject 62.5 mg into the vein every Thursday with hemodialysis.  . hydroxychloroquine (PLAQUENIL) 200 MG tablet Take 1 tablet (200 mg total) by mouth daily.  Marland Kitchen loperamide (IMODIUM A-D) 2 MG tablet Take 2 mg by mouth See admin instructions. Take one tablet (2 mg) by mouth on dialysis days (Tuesday, Thursday, Saturday)  . multivitamin (RENA-VIT) TABS tablet Take 1 tablet by mouth daily. (Patient taking differently: Take 1 tablet by mouth at bedtime. )  . Nutritional Supplements (NOVASOURCE RENAL) LIQD Take 237 mLs by mouth See admin instructions. Take 1 container (237 mls) by mouth  daily on dialysis days (Tuesday, Thursday, Saturday) - either during or after treatment     Allergies: Allergies as of 07/29/2018 - Review Complete 07/29/2018  Allergen Reaction Noted  . Tobramycin sulfate Swelling 07/05/2012   Past Medical History:  Diagnosis Date  . Anemia    low iron - receives iron at dialysis  . Chronic systolic congestive heart failure ([place]) 03/16/2016  . Dyspnea   . ESRD (end stage renal disease) ([place])    Hemo TTHSAT _ [place]  . H/O pericarditis 01/17/2013  . H/O pleural effusion 01/17/2013  . Heart murmur   . Lupus (systemic lupus erythematosus) (HCC)    Previously followed with Dr. Charlestine Night, has not followed up recently  . 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 Pneumonia    many times  . Polysubstance abuse ([place])    cocaine, MJ, tobacco  . S/P pericardiocentesis 01/17/2013   H/o pericardial effusion with tamponade 2006   . Seizures ([place])    during pregnancy 1 time  . Streptococcal bacteremia 01/23/2013   She had two S. pneumonae bacteremia on 01/21/2013. Sensitive to Peniccilin     Family History: SLE in aunts, SLE in uncle, Lung cancer in grandmother   Social History: Smokes 2-3 cigarette per day Alcohol 1-2 drinks per week Drugs: Marijuana. And former cocaine user   Review of Systems: A complete ROS was negative except as per HPI.   Physical Exam: Blood pressure (!) 158/102, pulse 93, temperature 98.2 F (36.8 C), temperature source Oral, resp. rate 18, height 5\' 4"  (1.626 m), weight 113 lb 14.4 oz (51.7 kg), SpO2 96 %.   Ph/E: Alert and oriented. In no acute distress. Can talk fluently with no difficulty breathing. CV: [id number], No murmur, Lungs: Mild bilateral rale at base Abdomen: Soft and non tender. BS normal Extremities: No LE edema. Pulses are normal bilateraly  BMP Latest Ref  Rng & Units 07/30/2018 07/29/2018 07/29/2018  Glucose 70 - 99 mg/dL 84 103(H) 95  BUN 6 - 20 mg/dL 81(H) 64(H) 103(H)  Creatinine 0.44 - 1.00 mg/dL 10.95(H) 9.55(H) 13.93(H)  Sodium 135 - 145 mmol/L 140 138 139  Potassium 3.5 - 5.1 mmol/L 4.4 3.9 6.6(HH)  Chloride 98 - 111 mmol/L 100 98 101  CO2 22 - 32 mmol/L 25 24 19(L)  Calcium 8.9 - 10.3 mg/dL 8.2(L) 8.5(L) 8.8(L)   Mg: 2.1  CBC Latest Ref Rng & Units 07/30/2018 07/29/2018 07/19/2018  WBC 4.0 - 10.5 K/uL 4.0 8.0 3.4(L)  Hemoglobin 12.0 - 15.0 g/dL 8.3(L) 9.3(L) 9.6(L)  Hematocrit 36.0 - 46.0 % 27.8(L) 31.9(L) 32.4(L)  Platelets 150 - 400 K/uL 182 223 270   EKG: personally reviewed my interpretation is sinus rhythm with PAC and poor R  progression  CXR: personally reviewed my interpretation is cardiomegaly, bilateral patchy infiltrate, interstitial edema diffused but mostly in upper lobes. with mild pleural effusion at left.   Assessment & Plan by Problem: Active Problems:   SLE (systemic lupus erythematosus) (HCC)   ESRD (end stage renal disease) on dialysis (HCC)   SVT (supraventricular tachycardia) (HCC)   Hyperkalemia  ESRD with short ness of breath volume overload with pulmonary edema and hyperkalemia: Usually on HD T,Th,Sat. Missed HD yesterday. Patient had cough with mild bloody foamy secretions. s/p 1h10 min HD today. Shoertness of breath significantly improved.  K came down to 3.9 BUN came down to: 64  -Hemodialysis tomorrow -Probable discharge tomorrow after HD  SLE: unmanaged for years.Plaquenil 200 mg started on last admission, 2 weeks ago.  -Continue Hydroxychloroquine -Follow up with rheumatologist  SVT: Has been sinus since HD. Has recurrent Hx of SVT during HD  On Coreg and Diltiazem pre HD at home and  -Continue Diltiazem prior to hemodialysis -Continue Coreg -Cardiac monitoring  CHD: Stable -Continue Carvedilol 6.25 BID  HTN: Stable  -Continue Carvedilol  Chronic anemia: most likely 2/2 ESRD.  Stable -Continue Ferric gluconate and Anaresp one time a week with HD  Dispo: Admit patient to Observation with expected length of stay less than 2 midnights.  SignedDewayne Hatch, MD 07/30/2018, 6:58 AM  Pager: [phone]

## 2018-07-29 NOTE — ED Triage Notes (Signed)
Pt in c/o shortness of breath since this morning, states she missed her dialysis yesterday, speaking in short phrases in triage, c/o chest pain

## 2018-07-29 NOTE — ED Provider Notes (Signed)
[facility] EMERGENCY DEPARTMENT Provider Note   CSN: [id number] Arrival date & time: 07/29/18  1457     History   Chief Complaint Chief Complaint  Patient presents with  . Shortness of Breath    HPI [name] is a 37 y.o. female with a h/o of Lupus, chronic systolic congestive heart failure, ESRD on HD (T/R/S), and polysubstance abuse who presents to the emergency department with a chief complaint of dyspnea.   The patient endorses dyspnea with associated chest pain, characterizes pressure, that has been constant since onset.  No known aggravating or alleviating factors. She denies palpitations, lower extremity swelling, back pain, dizziness, lightheadedness, or headache.  She reports that she typically gets symptoms similar to this when she needs dialysis.  She was last dialyzed on Tuesday, but left her session 30 minutes early.  Last full session of dialysis was Saturday, 6 days ago.  The history is provided by the patient. No language interpreter was used.    Past Medical History:  Diagnosis Date  . Anemia    low iron - receives iron at dialysis  . Chronic systolic congestive heart failure ([place]) 03/16/2016  . Dyspnea   . ESRD (end stage renal disease) ([place])    [facility] TTHSAT _ [place]  . H/O pericarditis 01/17/2013  . H/O pleural effusion 01/17/2013  . Heart murmur   . Lupus (systemic lupus erythematosus) (HCC)    Previously followed with Dr. Charlestine Night, has not followed up recently  . 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 Pneumonia    many times  . Polysubstance abuse ([place])    cocaine, MJ, tobacco  . S/P pericardiocentesis 01/17/2013   H/o pericardial effusion with tamponade 2006   . Seizures ([place])    during pregnancy 1 time  . Streptococcal bacteremia 01/23/2013   She had two S. pneumonae  bacteremia on 01/21/2013. Sensitive to Peniccilin     Patient Active Problem List   Diagnosis Date Noted  . Hyperkalemia 07/29/2018  . SVT (supraventricular tachycardia) ([place]) 07/16/2018  . ESRD (end stage renal disease) on dialysis ([place]) 02/17/2017  . Chronic systolic congestive heart failure ([place]) 03/16/2016  . Other hypervolemia   . S/P thoracentesis   . Cough with hemoptysis   . Myalgia   . Pulmonary edema   . Rash and nonspecific skin eruption 06/26/2015  . Secondary Raynaud's phenomenon 06/24/2015  . Cramping of hands 04/19/2014  . Unspecified contraceptive management 04/19/2014  . End stage renal disease ([place]) 03/27/2014  . Insomnia 03/15/2014  . Benign hypertension with ESRD (end-stage renal disease) ([place]) 03/15/2014  . Tobacco abuse 02/15/2014  . Healthcare maintenance 02/15/2014  . Hypoalbuminemia 02/01/2014  . Nephrotic syndrome 02/01/2014  . ESRD on dialysis ([place]) 01/31/2014  . Pleural effusion, left 01/31/2014  . Microcytic anemia 01/29/2014  . Hypocalcemia 01/29/2014  . Cocaine abuse ([place]) 01/18/2013  . Marijuana smoker ([place]) 01/18/2013  . H/O pericarditis 01/17/2013  . SLE (systemic lupus erythematosus) ([place]) 01/17/2013  . Lupus nephritis ([place]) 01/17/2013  . S/P pericardiocentesis 01/17/2013  . H/O pleural effusion 01/17/2013  . Nephrosis 01/17/2013  . Preseptal cellulitis 01/17/2013    Past Surgical History:  Procedure Laterality Date  . AV FISTULA PLACEMENT    . BASCILIC VEIN TRANSPOSITION Left 02/05/2014   Procedure: [place];  Surgeon: [name], MD;  Location: [facility];  Service:  Vascular;  Laterality: Left;  . FISTULA SUPERFICIALIZATION Left 05/30/2018   Procedure: FISTULA PLICATION BASILIC VEIN TRANSPOSITION;  Surgeon: Angelia Mould, MD;  Location: [facility];  Service: Vascular;  Laterality: Left;  Marland Kitchen VENOGRAM Right 01/31/2014   Procedure: DIALYSIS CATHETER;  Surgeon: Serafina Mitchell, MD;  Location: [facility] CATH LAB;  Service:  Cardiovascular;  Laterality: Right;     OB History   None      Home Medications    Prior to Admission medications   Medication Sig Start Date End Date Taking? Authorizing Provider  acetaminophen (TYLENOL) 500 MG tablet Take 1,000 mg by mouth 2 (two) times daily.    [provider]  albuterol (PROVENTIL HFA;VENTOLIN HFA) 108 (90 Base) MCG/ACT inhaler Inhale 1-2 puffs into the lungs every 6 (six) hours as needed for wheezing or shortness of breath. Patient not taking: Reported on 07/16/2018 02/03/16   Lucious Groves, DO  calcium acetate (PHOSLO) 667 MG capsule Take 2,001-4,002 mg by mouth See admin instructions. Take 6 capsule (4,002 mg) by mouth three times a day with each meal and 3 capsules (2,001 mg) with each snack    [provider]  carvedilol (COREG) 6.25 MG tablet Take 1 tablet (6.25 mg total) by mouth 2 (two) times daily with a meal. 07/19/18   Alphonzo Grieve, MD  cinacalcet (SENSIPAR) 30 MG tablet Take 1 tablet (30 mg total) by mouth daily with supper. 07/19/18   Alphonzo Grieve, MD  darbepoetin (ARANESP) 60 MCG/0.3ML SOLN injection Inject 0.3 mLs (60 mcg total) into the vein every Wednesday with hemodialysis. Patient taking differently: Inject 60 mcg into the vein every Saturday.  02/08/14   Jessee Avers, MD  diltiazem (CARDIZEM) 30 MG tablet Take 1 tablet (30 mg total) by mouth every 6 (six) hours as needed (SVT). 07/19/18   Alphonzo Grieve, MD  diphenhydrAMINE (BENADRYL) 25 MG tablet Take 25-50 mg by mouth See admin instructions. Take one tablet (25 mg) by mouth twice daily on Sunday, Monday, Wednesday, Friday, take two tablets (50 mg) once daily at dialysis on Tuesday, Thursday, Saturday.    [provider]  ferric citrate (AURYXIA) 1 GM 210 MG(Fe) tablet Take 210-420 mg by mouth See admin instructions. Take 2 tablets (420 mg) by mouth with meals and 1 tablet (210 mg) with snacks. Do not chew or crush medication. Swallow whole.    [provider]   ferric gluconate 62.5 mg in sodium chloride 0.9 % 100 mL Inject 62.5 mg into the vein every Thursday with hemodialysis. 03/09/16   Rice, Resa Miner, MD  hydroxychloroquine (PLAQUENIL) 200 MG tablet Take 1 tablet (200 mg total) by mouth daily. 07/19/18   Alphonzo Grieve, MD  loperamide (IMODIUM A-D) 2 MG tablet Take 2 mg by mouth See admin instructions. Take one tablet (2 mg) by mouth on dialysis days (Tuesday, Thursday, Saturday)    [provider]  multivitamin (RENA-VIT) TABS tablet Take 1 tablet by mouth daily. Patient taking differently: Take 1 tablet by mouth at bedtime.  02/18/17   Holley Raring, MD  Nutritional Supplements (NOVASOURCE RENAL) LIQD Take 237 mLs by mouth See admin instructions. Take 1 container (237 mls) by mouth daily on dialysis days (Tuesday, Thursday, Saturday) - either during or after treatment    [provider]    Family History History reviewed. No pertinent family history.  Social History Social History   Tobacco Use  . Smoking status: Current Every Day Smoker    Packs/day: 0.12    Years:  15.00    Pack years: 1.80    Types: Cigarettes  . Smokeless tobacco: Never Used  . Tobacco comment: quit smoking s/p hospital discharge/ SMOKES ABOUT 2-3 CIGARETTES A DAY  Substance Use Topics  . Alcohol use: Yes    Alcohol/week: 0.0 oz    Comment: Special Occasional  . Drug use: Yes    Frequency: 5.0 times per week    Types: Marijuana, Cocaine    Comment: Marijuana last time mid May 2019, Cocaine- last tile mid April 2019     Allergies   Tobramycin sulfate   Review of Systems Review of Systems  Constitutional: Negative for activity change, chills and fever.  HENT: Negative for congestion and sore throat.   Eyes: Negative for visual disturbance.  Respiratory: Positive for shortness of breath. Negative for cough and wheezing.   Cardiovascular: Positive for chest pain.  Gastrointestinal: Negative for abdominal pain, diarrhea, nausea and  vomiting.  Genitourinary: Negative for dysuria.  Musculoskeletal: Negative for back pain.  Skin: Negative for rash.  Allergic/Immunologic: Negative for immunocompromised state.  Neurological: Negative for weakness, numbness and headaches.  Psychiatric/Behavioral: Negative for confusion.     Physical Exam Updated Vital Signs BP (!) 159/110 (BP Location: Left Arm)   Pulse 97   Temp 98 F (36.7 C) (Oral)   Resp 18   SpO2 98%   Physical Exam  Constitutional: No distress.  HENT:  Head: Normocephalic.  Eyes: Conjunctivae are normal.  Neck: Neck supple.  Cardiovascular: Normal rate and regular rhythm. Exam reveals no gallop and no friction rub.  Pulmonary/Chest: Effort normal. No stridor. Tachypnea noted. No respiratory distress. She has no wheezes.  Crackles in the bilateral bases.  Appears volume overloaded.  Abdominal: Soft. She exhibits no distension and no mass. There is no tenderness. There is no rebound and no guarding. No hernia.  Musculoskeletal: She exhibits no tenderness.  Neurological: She is alert.  Skin: Skin is warm. No rash noted.  Psychiatric: Her behavior is normal.  Nursing note and vitals reviewed.  ED Treatments / Results  Labs (all labs ordered are listed, but only abnormal results are displayed) Labs Reviewed  BASIC METABOLIC PANEL - Abnormal; Notable for the following components:      Result Value   Potassium 6.6 (*)    CO2 19 (*)    BUN 103 (*)    Creatinine, Ser 13.93 (*)    Calcium 8.8 (*)    GFR calc non Af Amer 3 (*)    GFR calc Af Amer 3 (*)    Anion gap 19 (*)    All other components within normal limits  CBC - Abnormal; Notable for the following components:   RBC 3.22 (*)    Hemoglobin 9.3 (*)    HCT 31.9 (*)    MCHC 29.2 (*)    RDW 20.9 (*)    All other components within normal limits  I-STAT TROPONIN, ED    EKG EKG Interpretation  Date/Time:  Friday July 29 2018 15:06:12 EDT Ventricular Rate:  106 PR Interval:  194 QRS  Duration: 90 QT Interval:  360 QTC Calculation: 478 R Axis:   119 Text Interpretation:  Sinus tachycardia Possible Left atrial enlargement Left posterior fascicular block Abnormal ECG peaked t waves Confirmed by Deno Etienne [phone]) on 07/29/2018 4:46:16 PM   Radiology Dg Chest 2 View  Result Date: 07/29/2018 CLINICAL DATA:  38 year old female missed dialysis yesterday. Cough, hemoptysis, shortness of breath. EXAM: CHEST - 2 VIEW COMPARISON:  Portable chest  07/16/2018 and earlier. FINDINGS: Chronic cardiomegaly. Multifocal patchy and confluent bilateral perihilar opacity. Upper lobe predominance. Mixed interstitial and airspace appearance. Small bilateral pleural effusions may be increased since July. Visualized tracheal air column is within normal limits. No pneumothorax. No acute osseous abnormality identified. Negative visible bowel gas pattern. IMPRESSION: 1. Widespread bilateral upper lobe predominant opacity with mixed interstitial and airspace appearance. 2. Chronic cardiomegaly. Small bilateral pleural effusions which may have increased since July. 3. Favor acute asymmetric pulmonary edema. Differential considerations include bilateral pneumonia, pulmonary vasculitis. Electronically Signed   By: Genevie Ann M.D.   On: 07/29/2018 15:57    Procedures Procedures (including critical care time)  Medications Ordered in ED Medications  Chlorhexidine Gluconate Cloth 2 % PADS 6 each (has no administration in time range)     Initial Impression / Assessment and Plan / ED Course  I have reviewed the triage vital signs and the nursing notes.  Pertinent labs & imaging results that were available during my care of the patient were reviewed by me and considered in my medical decision making (see chart for details).     37 year old with a h/o of Lupus, chronic systolic congestive heart failure, ESRD on HD (T/R/S), and polysubstance abuse who presents to the emergency department with a chief complaint of  dyspnea and chest pain.  K 6.6. Ag 19, elevated from her baseline. EKG with peaked T waves. Hgb at her baseline.  She is hemodynamically stable and in no acute distress at this time.  Spoke with Dr. Moshe Cipro prior to the patient being roomed in the ED who reports that the patient is headed to emergent dialysis within the next few minutes.  Will defer treatment of hyperkalemia at this time.  The patient was discussed with Dr. Tyrone Nine, attending physician.  Dr. Moshe Cipro anticipates discharge to home following dialysis session.  Final Clinical Impressions(s) / ED Diagnoses   Final diagnoses:  Other hypervolemia  Hyperkalemia  ESRD needing dialysis [facility])    ED Discharge Orders    None       Joanne Gavel, PA-C 07/29/18 [place], [place], DO 07/29/18 1932

## 2018-07-29 NOTE — Significant Event (Signed)
Rapid Response Event Note  Overview: Pt in reported SVT during HD treatment  Initial Focused Assessment: Upon arrival, pt sitting up in stretcher in tripod position with HD treatment running.  Pt is alert and oriented x 4.  She is c/o chest fluttering like a horse kicked her in the chest and SOB.  HR SVT 190, BP 94/57, RR 22 with sats 90% on 2L [place].  BBS Rhonchi with accessory muscle use.  Per pt and RN Tori, pt routinely takes Diltiazem 30mg  PO prior to start of HD because she has a history of SVT.  She did not receive the Diltiazem prior to the start of this treatment (1K bath).  Dr. Clover Mealy ordered Diltiazem 30 PO per Herbert Spires RN and Diltizem was given at 1906.    Interventions: -Insert PIV  Plan of Care (if not transferred): -Dr. Clover Mealy ordered for Tori to return Ms. Defrain to the ED. Pt transported to D33 and primary care given to [facility].  Event Summary: Pt converted back to SR at [place].  HR 98, BP 139/84, RR 18 sats 94% on 2L [place]. Pt denies SOB.   Madelynn Done

## 2018-07-29 NOTE — ED Notes (Addendum)
Dr Ellender Hose at bedside. EKG given to him.

## 2018-07-29 NOTE — Progress Notes (Signed)
HD tx ended @ 1906 after pt went into SVT w/ highest HR I observed being 207, pt was A&Ox4 throughout and only c/o being hot and felt like a roller coaster. When I was told pt has hx of going into SVT on HD tx it was explained as she takes po Cardizem prn if she goes into it on HD tx, after the episode happened pt explained that she is to take a po cardizem prior to each HD tx to prevent her from going into SVT, she stated it was pretty new and maybe no one knew to order it, I had already called and received the order for it from Dr. Moshe Cipro in case it was needed, so I ordered from pharm stat and admin at 46. Dr. Moshe Cipro was called and made aware of situation and order was given to return pt to ED for assess and tx of the issue.  RR nurse was called approximately 1900 and arrived promptly and assisted w/ transfer of pt to ED once PIV was established and HD needles were decannulated. Report given to ED, RN

## 2018-07-29 NOTE — Progress Notes (Signed)
Received pt from [facility], RN, HD tx started @ [phone] w/o problem per report, pull/push/flush well w/o problem per report, VSS,  Was told pt has hx of going into SVT on HD tx and takes cardizem if she does, will cont to monitor while on HD tx

## 2018-07-29 NOTE — ED Notes (Signed)
Pt was in dialysis and brought back down by Dialysis RN after 1 hour and 10 minutes of treatment because pt went into SVT highest rate of 207. Pt usually gets 30mg  of cardizem po prior to dialysis but did not get it until after dialysis had started today. Pt received 30mg  of cardizem at 1906, then pt converted at 1930 to NSR. Pt is axox4. Pt states that her shob and palpitations went away when she converted. VSS now. Needs recheck of K level.

## 2018-07-29 NOTE — ED Provider Notes (Addendum)
Patient placed in Quick Look pathway, seen and evaluated   Chief Complaint: SOB, missed dialysis  HPI:   37 year old female presents with SOB which started this morning as well as hemoptysis. She missed dialysis yesterday because she over slept.  She called for an appt today but was told there were no spots. She states this has happened multiple times before. She was recently admitted for SVT a couple weeks ago but states she hasn't had this issue recently. No fever or cough. No chest pain. She has an appt with PCP in August.  ROS: +SOB, hemoptysis  Physical Exam:   Gen: Mild respiratory distress  Neuro: Awake and Alert  Skin: Warm    Focused Exam: Heart: Tachycardic and regular    Lungs: CTA. Tachypneic. Speaks in short sentences    Skin: Multiple lesions.   Initiation of care has begun. The patient has been counseled on the process, plan, and necessity for staying for the completion/evaluation, and the remainder of the medical screening examination    Recardo Evangelist, PA-C 07/29/18 1514    Recardo Evangelist, PA-C 07/29/18 1538    Quintella Reichert, MD 07/30/18 [phone]

## 2018-07-30 ENCOUNTER — Encounter (HOSPITAL_COMMUNITY): Payer: Self-pay | Admitting: *Deleted

## 2018-07-30 ENCOUNTER — Other Ambulatory Visit: Payer: Self-pay

## 2018-07-30 DIAGNOSIS — E8779 Other fluid overload: Secondary | ICD-10-CM | POA: Diagnosis not present

## 2018-07-30 DIAGNOSIS — N186 End stage renal disease: Secondary | ICD-10-CM | POA: Diagnosis not present

## 2018-07-30 DIAGNOSIS — I132 Hypertensive heart and chronic kidney disease with heart failure and with stage 5 chronic kidney disease, or end stage renal disease: Secondary | ICD-10-CM | POA: Diagnosis not present

## 2018-07-30 DIAGNOSIS — I5042 Chronic combined systolic (congestive) and diastolic (congestive) heart failure: Secondary | ICD-10-CM | POA: Diagnosis not present

## 2018-07-30 LAB — RENAL FUNCTION PANEL
Albumin: 2.1 g/dL — ABNORMAL LOW (ref 3.5–5.0)
Anion gap: 15 (ref 5–15)
BUN: 81 mg/dL — AB (ref 6–20)
CALCIUM: 8.2 mg/dL — AB (ref 8.9–10.3)
CO2: 25 mmol/L (ref 22–32)
Chloride: 100 mmol/L (ref 98–111)
Creatinine, Ser: 10.95 mg/dL — ABNORMAL HIGH (ref 0.44–1.00)
GFR calc Af Amer: 5 mL/min — ABNORMAL LOW (ref >60)
GFR calc non Af Amer: 4 mL/min — ABNORMAL LOW (ref >60)
GLUCOSE: 84 mg/dL (ref 70–99)
Phosphorus: 10.1 mg/dL — ABNORMAL HIGH (ref 2.5–4.6)
Potassium: 4.4 mmol/L (ref 3.5–5.1)
SODIUM: 140 mmol/L (ref 135–145)

## 2018-07-30 LAB — CBC
HCT: 27.8 % — ABNORMAL LOW (ref 36.0–46.0)
Hemoglobin: 8.3 g/dL — ABNORMAL LOW (ref 12.0–15.0)
MCH: 28.9 pg (ref 26.0–34.0)
MCHC: 29.9 g/dL — AB (ref 30.0–36.0)
MCV: 96.9 fL (ref 78.0–100.0)
PLATELETS: 182 10*3/uL (ref 150–400)
RBC: 2.87 MIL/uL — ABNORMAL LOW (ref 3.87–5.11)
RDW: 20.4 % — ABNORMAL HIGH (ref 11.5–15.5)
WBC: 4 10*3/uL (ref 4.0–10.5)

## 2018-07-30 MED ORDER — HEPARIN SODIUM (PORCINE) 1000 UNIT/ML DIALYSIS
20.0000 [IU]/kg | INTRAMUSCULAR | Status: DC | PRN
  Administered 2018-07-30: 1000 [IU] via INTRAVENOUS_CENTRAL

## 2018-07-30 MED ORDER — DIPHENHYDRAMINE HCL 25 MG PO CAPS
50.0000 mg | ORAL_CAPSULE | Freq: Once | ORAL | Status: AC
  Administered 2018-07-30: 50 mg via ORAL
  Filled 2018-07-30: qty 2

## 2018-07-30 MED ORDER — ACETAMINOPHEN 500 MG PO TABS: 1000.0000 mg | ORAL_TABLET | Freq: Two times a day (BID) | ORAL | 0 refills | Status: DC | PRN

## 2018-07-30 MED ORDER — LOPERAMIDE HCL 2 MG PO CAPS
ORAL_CAPSULE | ORAL | Status: AC
  Filled 2018-07-30: qty 1

## 2018-07-30 MED ORDER — FERRIC CITRATE 1 GM 210 MG(FE) PO TABS
210.0000 mg | ORAL_TABLET | ORAL | Status: DC | PRN
  Filled 2018-07-30: qty 1

## 2018-07-30 MED ORDER — FERRIC CITRATE 1 GM 210 MG(FE) PO TABS
420.0000 mg | ORAL_TABLET | Freq: Three times a day (TID) | ORAL | Status: DC
  Filled 2018-07-30 (×3): qty 2

## 2018-07-30 MED ORDER — DIPHENHYDRAMINE HCL 25 MG PO CAPS: 50.0000 mg | ORAL_CAPSULE | ORAL | Status: DC

## 2018-07-30 MED ORDER — ACETAMINOPHEN 325 MG PO TABS
ORAL_TABLET | ORAL | Status: AC
  Filled 2018-07-30: qty 2

## 2018-07-30 MED ORDER — CARVEDILOL 6.25 MG PO TABS
6.2500 mg | ORAL_TABLET | Freq: Once | ORAL | Status: AC
  Administered 2018-07-30: 6.25 mg via ORAL
  Filled 2018-07-30: qty 1

## 2018-07-30 MED ORDER — LOPERAMIDE HCL 2 MG PO CAPS: 2.0000 mg | ORAL_CAPSULE | Freq: Once | ORAL | Status: DC

## 2018-07-30 NOTE — Progress Notes (Signed)
Patient returns to the floor from dialysis.

## 2018-07-30 NOTE — Progress Notes (Signed)
Report received from dialysis.

## 2018-07-30 NOTE — Progress Notes (Signed)
Internal Medicine Attending  Date: 07/30/2018  Patient name: [name] Medical record number: [id number] Date of birth: 1981-01-21 Age: 37 y.o. Gender: female  I saw and evaluated the patient. I reviewed the resident's note by Dr. Danford Bad and I agree with the resident's findings and plans as documented in her progress note.  Please see my H&P dated 07/30/2018 and attached to Dr. Darcey Nora H&P dated 07/29/2018 for the specifics of my evaluation, assessment, and plan for earlier in the day.

## 2018-07-30 NOTE — Progress Notes (Signed)
Patient resting comfortably during shift report. Denies complaints.  

## 2018-07-30 NOTE — Progress Notes (Signed)
   Subjective: [name] was seen and evaluated today at bedside in HD unit just prior to her session. She reports feeling much better than last night now that her SVT is controlled. She has no other complaints presently and was agreeable to DC if she did well with HD. No chest pain or SOB.  Objective:  Vital signs in last 24 hours: Vitals:   07/30/18 1115 07/30/18 1130 07/30/18 1200 07/30/18 1223  BP: (!) 126/57 131/82 137/87 (!) 146/96  Pulse: 92 84 84 79  Resp: (!) 21 19 20 18   Temp:    98.1 F (36.7 C)  TempSrc:    Oral  SpO2: 98% 96% 96% 97%  Weight:    107 lb 2.3 oz (48.6 kg)  Height:       General: Thin AA female sitting up in bed in HD unit. In no acute distress. Pleasant and conversant. Appears well.   Cardiovascular: Regular rate and rhythm. No murmur appreciated.  Pulmonary: Lungs clear throughout without increased WOB. Saturating well on RA.  Abdomen: Soft, not distended.  Psych: Mood normal and affect was mood congruent. Responds to questions appropriately.   I/O last 3 completed shifts: In: 240 [P.O.:240] Out: 40 [Other:805] Total I/O In: 47 [P.O.:120] Out: 3184 [Other:3184]  Assessment/Plan:  Active Problems:   SLE (systemic lupus erythematosus) (HCC)   ESRD (end stage renal disease) on dialysis (HCC)   SVT (supraventricular tachycardia) (HCC)   Hyperkalemia  ESRD on HD T/Th/Sat Hyperkalemia SLE Patient tolerated HD well today and overall feels much improved since admission after controlling SVT with her prn dilt. Potassium much improved, was 4.4 just prior to HD this morning. Pt still above EDW however nephrology OK with discharge with outpatient follow-up. She will have her next HD session on Tuesday and was advised to use caution over the weekend in regards to her volume status.  -DC home, OK with Nephro to resume HD on usual schedule T/Th/Sat -Plaquenil 200mg  continued  SVT SVT resolved promptly with prn Diltiazem. She usually takes this before HD  sessions and prn if needed however did not receive this prior to her HD session yesterday which is a likely trigger. She is rate controlled presently. Advised to continue this regimen at home.   Dispo: Discharging home today.  Khylin Gutridge, DO 07/30/2018, 1:24 PM Pager: [phone]

## 2018-07-30 NOTE — Progress Notes (Signed)
Call placed to [facility] to notify of telemetry monitoring d/c.   

## 2018-07-30 NOTE — Procedures (Signed)
Patient was seen on dialysis and the procedure was supervised.  BFR 400  Via AVF BP is  148/95.   Patient appears to be tolerating treatment well- took diltiazem before tx so far so good- is 5.2 above EDW - pulling 4 L- pt told to watch it over weekend to make it until Tuesday for next treatment as OP.  Is OK for discharge after HD from my standpoint   [name] A 07/30/2018

## 2018-08-04 LAB — DRUG SCREEN 10 W/CONF, SERUM
Amphetamines, IA: NEGATIVE ng/mL
Barbiturates, IA: NEGATIVE ug/mL
Benzodiazepines, IA: NEGATIVE ng/mL
Cocaine & Metabolite, IA: POSITIVE ng/mL
Methadone, IA: NEGATIVE ng/mL
Opiates, IA: NEGATIVE ng/mL
Oxycodones, IA: NEGATIVE ng/mL
Phencyclidine, IA: NEGATIVE ng/mL
Propoxyphene, IA: NEGATIVE ng/mL
THC(Marijuana) Metabolite, IA: NEGATIVE ng/mL

## 2018-08-04 LAB — THC,MS,WB/SP RFX
Cannabidiol: NEGATIVE ng/mL
Cannabinoid Confirmation: NEGATIVE
Cannabinol: NEGATIVE ng/mL
Carboxy-THC: NEGATIVE ng/mL
Hydroxy-THC: NEGATIVE ng/mL
Tetrahydrocannabinol(THC): NEGATIVE ng/mL

## 2018-08-04 LAB — COCAINE,MS,WB/SP RFX
Benzoylecgonine: 993 ng/mL
Cocaine Confirmation: POSITIVE
Cocaine: NEGATIVE ng/mL

## 2018-08-04 NOTE — Discharge Summary (Signed)
Name: [name] MRN: [id number] DOB: 03/14/1981 37 y.o. PCP: Patient, No Pcp Per  Date of Admission: 07/29/2018  3:01 PM Date of Discharge: 07/30/2018 Attending Physician: Oval Linsey, MD  Discharge Diagnosis: 1. ESRD on HD T/Th/Sat 2. SVT  Discharge Medications: Allergies as of 07/30/2018      Reactions   Tobramycin Sulfate Swelling      Medication List    TAKE these medications   acetaminophen 500 MG tablet Commonly known as:  TYLENOL Take 2 tablets (1,000 mg total) by mouth 2 (two) times daily as needed. What changed:    when to take this  reasons to take this   albuterol 108 (90 Base) MCG/ACT inhaler Commonly known as:  PROVENTIL HFA;VENTOLIN HFA Inhale 1-2 puffs into the lungs every 6 (six) hours as needed for wheezing or shortness of breath.   AURYXIA 1 GM 210 MG(Fe) tablet Generic drug:  ferric citrate Take 210-420 mg by mouth See admin instructions. Take 2 tablets (420 mg) by mouth with meals and 1 tablet (210 mg) with snacks. Do not chew or crush medication. Swallow whole.   calcium acetate 667 MG capsule Commonly known as:  PHOSLO Take 2,001-4,002 mg by mouth See admin instructions. Take 6 capsule (4,002 mg) by mouth three times a day with each meal and 3 capsules (2,001 mg) with each snack   carvedilol 6.25 MG tablet Commonly known as:  COREG Take 1 tablet (6.25 mg total) by mouth 2 (two) times daily with a meal.   cinacalcet 30 MG tablet Commonly known as:  SENSIPAR Take 1 tablet (30 mg total) by mouth daily with supper.   darbepoetin 60 MCG/0.3ML Soln injection Commonly known as:  ARANESP Inject 0.3 mLs (60 mcg total) into the vein every Wednesday with hemodialysis. What changed:  when to take this   diltiazem 30 MG tablet Commonly known as:  CARDIZEM Take 1 tablet (30 mg total) by mouth every 6 (six) hours as needed (SVT).   diphenhydrAMINE 25 MG tablet Commonly known as:  BENADRYL Take 25-50 mg by mouth See admin instructions. Take one  tablet (25 mg) by mouth twice daily on Sunday, Monday, Wednesday, Friday, take two tablets (50 mg) once daily at dialysis on Tuesday, Thursday, Saturday.   ferric gluconate 62.5 mg in sodium chloride 0.9 % 100 mL Inject 62.5 mg into the vein every Thursday with hemodialysis.   hydroxychloroquine 200 MG tablet Commonly known as:  PLAQUENIL Take 1 tablet (200 mg total) by mouth daily.   loperamide 2 MG tablet Commonly known as:  IMODIUM A-D Take 2 mg by mouth See admin instructions. Take one tablet (2 mg) by mouth on dialysis days (Tuesday, Thursday, Saturday)   multivitamin Tabs tablet Take 1 tablet by mouth daily. What changed:  when to take this   NOVASOURCE RENAL Liqd Take 237 mLs by mouth See admin instructions. Take 1 container (237 mls) by mouth daily on dialysis days (Tuesday, Thursday, Saturday) - either during or after treatment       Disposition and follow-up:   [name] was discharged from [facility] in stable condition.  At the hospital follow up visit please address:  1.  ESRD: Ensure compliance with prescribed HD sessions.  SVT: Ensure pre-treatment with Dilt prior to HD sessions  2.  Labs / imaging needed at time of follow-up: None  3.  Pending labs/ test needing follow-up: None  Follow-up Appointments:   Hospital Course by problem list: 1. ESRD on  HD T/Tr/Sat 99-M/E F with SLE complicated by ESRD on HD, SVT and chronic combined HF with severe MR who presents with 1-day history of shortness of breath after missing dialysis. She was in her usual state of health until the morning of admission when when she noted dyspnea upon awakening. The day prior, she overslept and missed her hemodialysis appointment. She contacted her HD center who had no open seats so she was sent to ED for evaluation and admitted for volume overload and HD. While undergoing HD, she developed SVT because she did not take her usual pre-dialysis diltiazem dose due to the  urgency. She had significant improvement in her respiratory status after a brief HD session and had no further episodes of SVT during admission. She was discharged home and encouraged to attend her scheduled T/Thr/Sat sessions and encouraged to take her pre-treatment diltiazem.   2. SVT Patient with known history of SVT who pre-treats with Diltiazem before HD sessions. Received dilt prior to HD session during admission without issue.   Discharge Vitals:   BP (!) 146/96 (BP Location: Right Arm)   Pulse 79   Temp 98.1 F (36.7 C) (Oral)   Resp 18   Ht 5\' 4"  (1.626 m)   Wt 48.6 kg Comment: Standing Weight  SpO2 97%   BMI 18.39 kg/m   Signed: MoltRomelle Starcher, DO 08/04/2018, 4:28 PM   Pager: [phone]

## 2018-08-09 NOTE — Progress Notes (Deleted)
Cardiology Office Note   Date:  08/09/2018   ID:  [name], DOB November 14, 1981, MRN [id number]  PCP:  Patient, No Pcp Per  Cardiologist:   No chief complaint on file. (EKG please)   History of Present Illness: [name] is a 37 y.o. female who presents for ongoing assessment and management of chronic systolic CHF, chronic dyspnea, with other history to include ESRD with dialysis, anemia, and lupus. Recently admitted to [facility] in the setting of dyspnea after missing dialysis appointments. While undergoing inpatient dialysis, she developed SVT.She apparently did not take pre-dialysis diltiazem due to urgency of dialysis.  She is her for post hospital follow up.   Past Medical History:  Diagnosis Date  . Anemia    low iron - receives iron at dialysis  . Chronic systolic congestive heart failure ([place]) 03/16/2016  . Dyspnea   . ESRD (end stage renal disease) ([place])    [facility] TTHSAT _ [place]  . H/O pericarditis 01/17/2013  . H/O pleural effusion 01/17/2013  . Heart murmur   . Lupus (systemic lupus erythematosus) ([facility])    Previously followed with Dr. Charlestine Night, has not followed up recently  . 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 Pneumonia    many times  . Polysubstance abuse ([place])    cocaine, MJ, tobacco  . S/P pericardiocentesis 01/17/2013   H/o pericardial effusion with tamponade 2006   . Seizures ([place])    during pregnancy 1 time  . Streptococcal bacteremia 01/23/2013   She had two S. pneumonae bacteremia on 01/21/2013. Sensitive to Peniccilin     Past Surgical History:  Procedure Laterality Date  . AV FISTULA PLACEMENT    . BASCILIC VEIN TRANSPOSITION Left 02/05/2014   Procedure: [place];  Surgeon: [name], MD;  Location: [place];  Service: Vascular;  Laterality: Left;  . FISTULA SUPERFICIALIZATION  Left 05/30/2018   Procedure: FISTULA PLICATION BASILIC VEIN TRANSPOSITION;  Surgeon: Angelia Mould, MD;  Location: [place];  Service: Vascular;  Laterality: Left;  Marland Kitchen VENOGRAM Right 01/31/2014   Procedure: DIALYSIS CATHETER;  Surgeon: Serafina Mitchell, MD;  Location: [facility] CATH LAB;  Service: Cardiovascular;  Laterality: Right;     Current Outpatient Medications  Medication Sig Dispense Refill  . acetaminophen (TYLENOL) 500 MG tablet Take 2 tablets (1,000 mg total) by mouth 2 (two) times daily as needed. 30 tablet 0  . albuterol (PROVENTIL HFA;VENTOLIN HFA) 108 (90 Base) MCG/ACT inhaler Inhale 1-2 puffs into the lungs every 6 (six) hours as needed for wheezing or shortness of breath. 6.7 g 0  . calcium acetate (PHOSLO) 667 MG capsule Take 2,001-4,002 mg by mouth See admin instructions. Take 6 capsule (4,002 mg) by mouth three times a day with each meal and 3 capsules (2,001 mg) with each snack    . carvedilol (COREG) 6.25 MG tablet Take 1 tablet (6.25 mg total) by mouth 2 (two) times daily with a meal. 60 tablet 2  . cinacalcet (SENSIPAR) 30 MG tablet Take 1 tablet (30 mg total) by mouth daily with supper. 60 tablet 1  . darbepoetin (ARANESP) 60 MCG/0.3ML SOLN injection Inject 0.3 mLs (60 mcg total) into the vein every Wednesday with hemodialysis. (Patient taking differently: Inject 60 mcg into the vein every Saturday. ) 4.2 mL 0  . diltiazem (CARDIZEM) 30 MG tablet Take 1 tablet (30 mg total) by  mouth every 6 (six) hours as needed (SVT). 60 tablet 1  . diphenhydrAMINE (BENADRYL) 25 MG tablet Take 25-50 mg by mouth See admin instructions. Take one tablet (25 mg) by mouth twice daily on Sunday, Monday, Wednesday, Friday, take two tablets (50 mg) once daily at dialysis on Tuesday, Thursday, Saturday.    . ferric citrate (AURYXIA) 1 GM 210 MG(Fe) tablet Take 210-420 mg by mouth See admin instructions. Take 2 tablets (420 mg) by mouth with meals and 1 tablet (210 mg) with snacks. Do not chew or crush  medication. Swallow whole.    . ferric gluconate 62.5 mg in sodium chloride 0.9 % 100 mL Inject 62.5 mg into the vein every Thursday with hemodialysis.    . hydroxychloroquine (PLAQUENIL) 200 MG tablet Take 1 tablet (200 mg total) by mouth daily. 30 tablet 2  . loperamide (IMODIUM A-D) 2 MG tablet Take 2 mg by mouth See admin instructions. Take one tablet (2 mg) by mouth on dialysis days (Tuesday, Thursday, Saturday)    . multivitamin (RENA-VIT) TABS tablet Take 1 tablet by mouth daily. (Patient taking differently: Take 1 tablet by mouth at bedtime. ) 30 tablet 0  . Nutritional Supplements (NOVASOURCE RENAL) LIQD Take 237 mLs by mouth See admin instructions. Take 1 container (237 mls) by mouth daily on dialysis days (Tuesday, Thursday, Saturday) - either during or after treatment     No current facility-administered medications for this visit.     Allergies:   Tobramycin sulfate    Social History:  The patient  reports that she has been smoking cigarettes. She has a 1.80 pack-year smoking history. She has never used smokeless tobacco. She reports that she drinks alcohol. She reports that she has current or past drug history. Drugs: Marijuana and Cocaine. Frequency: 5.00 times per week.   Family History:  The patient's family history is not on file.    ROS: All other systems are reviewed and negative. Unless otherwise mentioned in H&P    PHYSICAL EXAM: VS:  There were no vitals taken for this visit. , BMI There is no height or weight on file to calculate BMI. GEN: Well nourished, well developed, in no acute distress  HEENT: normal  Neck: no JVD, carotid bruits, or masses Cardiac: ***RRR; no murmurs, rubs, or gallops,no edema  Respiratory:  clear to auscultation bilaterally, normal work of breathing GI: soft, nontender, nondistended, + BS MS: no deformity or atrophy  Skin: warm and dry, no rash Neuro:  Strength and sensation are intact Psych: euthymic mood, full affect   EKG:  EKG  {ACTION; IS/IS CNO:[id number]} ordered today. The ekg ordered today demonstrates ***   Recent Labs: 05/21/2018: ALT 22 07/29/2018: Magnesium 2.1 07/30/2018: BUN 81; Creatinine, Ser 10.95; Hemoglobin 8.3; Platelets 182; Potassium 4.4; Sodium 140    Lipid Panel    Component Value Date/Time   CHOL 190 01/30/2014 0616   TRIG 105 06/30/2018 0808   HDL 25 (L) 01/30/2014 0616   CHOLHDL 7.6 01/30/2014 0616   VLDL 49 (H) 01/30/2014 0616   LDLCALC 116 (H) 01/30/2014 0616      Wt Readings from Last 3 Encounters:  07/30/18 107 lb 2.3 oz (48.6 kg)  07/19/18 107 lb 12.9 oz (48.9 kg)  07/14/18 116 lb 13.5 oz (53 kg)      Other studies Reviewed: Additional studies/ records that were reviewed today include: ***. Review of the above records demonstrates: ***   ASSESSMENT AND PLAN:  1.  ***   Current medicines are  reviewed at length with the patient today.    Labs/ tests ordered today include: *** Phill Myron. [place] Pugh, ANP, AACC   08/09/2018 3:26 PM    [facility] [address] [place], [place] [address] Phone: [phone]; Fax: [phone]

## 2018-08-10 ENCOUNTER — Ambulatory Visit: Payer: Medicaid Other | Admitting: Adult Health

## 2018-08-11 ENCOUNTER — Encounter: Payer: Self-pay | Admitting: *Deleted

## 2018-08-18 ENCOUNTER — Encounter (INDEPENDENT_AMBULATORY_CARE_PROVIDER_SITE_OTHER): Payer: Self-pay | Admitting: Physician Assistant

## 2018-08-18 ENCOUNTER — Other Ambulatory Visit: Payer: Self-pay

## 2018-08-18 ENCOUNTER — Ambulatory Visit (INDEPENDENT_AMBULATORY_CARE_PROVIDER_SITE_OTHER): Payer: Medicaid Other | Admitting: Physician Assistant

## 2018-08-18 VITALS — BP 121/78 | HR 103 | Temp 98.0°F | Ht 64.0 in | Wt 111.6 lb

## 2018-08-18 DIAGNOSIS — Z760 Encounter for issue of repeat prescription: Secondary | ICD-10-CM

## 2018-08-18 DIAGNOSIS — N186 End stage renal disease: Secondary | ICD-10-CM

## 2018-08-18 DIAGNOSIS — L989 Disorder of the skin and subcutaneous tissue, unspecified: Secondary | ICD-10-CM

## 2018-08-18 DIAGNOSIS — I5022 Chronic systolic (congestive) heart failure: Secondary | ICD-10-CM

## 2018-08-18 DIAGNOSIS — M79604 Pain in right leg: Secondary | ICD-10-CM

## 2018-08-18 DIAGNOSIS — I10 Essential (primary) hypertension: Secondary | ICD-10-CM

## 2018-08-18 DIAGNOSIS — M79605 Pain in left leg: Secondary | ICD-10-CM

## 2018-08-18 DIAGNOSIS — M3214 Glomerular disease in systemic lupus erythematosus: Secondary | ICD-10-CM | POA: Diagnosis not present

## 2018-08-18 MED ORDER — CARVEDILOL 6.25 MG PO TABS: 6.2500 mg | ORAL_TABLET | Freq: Two times a day (BID) | ORAL | 2 refills | Status: DC

## 2018-08-18 MED ORDER — ACETAMINOPHEN 500 MG PO TABS: 500.0000 mg | ORAL_TABLET | Freq: Three times a day (TID) | ORAL | 0 refills | Status: DC | PRN

## 2018-08-18 MED ORDER — ALBUTEROL SULFATE HFA 108 (90 BASE) MCG/ACT IN AERS: 1.0000 | INHALATION_SPRAY | Freq: Four times a day (QID) | RESPIRATORY_TRACT | 1 refills | Status: DC | PRN

## 2018-08-18 NOTE — Progress Notes (Signed)
Subjective:  Patient ID: [name], female    DOB: Mar 04, 1981  Age: 37 y.o. MRN: [id number]  CC: hospital f/u   HPI [name] is a 37 y.o. female with a medical history of anemia, chronic CHF, SVT, MR, ESRD on HD, heart murmur, Lupus, lupus nephritis, PNA, polysubstance abuse (cocaine, marijuana, tobacco), strep bacteremia, pericarditis, and pleural effusion presents as a new pt on hospital discharge f/u. Went to ED on 07/29/18 for dyspnea and observed overnight. Diagnosed with volume overload. Pt states she is currently feeling well overall except for bilateral leg pain that usually occurs on the days of her hemodialysis. Does not endorse any other symptoms or complaints.        Outpatient Medications Prior to Visit  Medication Sig Dispense Refill  . acetaminophen (TYLENOL) 500 MG tablet Take 2 tablets (1,000 mg total) by mouth 2 (two) times daily as needed. 30 tablet 0  . carvedilol (COREG) 6.25 MG tablet Take 1 tablet (6.25 mg total) by mouth 2 (two) times daily with a meal. 60 tablet 2  . darbepoetin (ARANESP) 60 MCG/0.3ML SOLN injection Inject 0.3 mLs (60 mcg total) into the vein every Wednesday with hemodialysis. (Patient taking differently: Inject 60 mcg into the vein every Saturday. ) 4.2 mL 0  . diltiazem (CARDIZEM) 30 MG tablet Take 1 tablet (30 mg total) by mouth every 6 (six) hours as needed (SVT). 60 tablet 1  . diphenhydrAMINE (BENADRYL) 25 MG tablet Take 25-50 mg by mouth See admin instructions. Take one tablet (25 mg) by mouth twice daily on Sunday, Monday, Wednesday, Friday, take two tablets (50 mg) once daily at dialysis on Tuesday, Thursday, Saturday.    . ferric citrate (AURYXIA) 1 GM 210 MG(Fe) tablet Take 210-420 mg by mouth See admin instructions. Take 2 tablets (420 mg) by mouth with meals and 1 tablet (210 mg) with snacks. Do not chew or crush medication. Swallow whole.    . ferric gluconate 62.5 mg in sodium chloride 0.9 % 100 mL Inject 62.5 mg into the vein  every Thursday with hemodialysis.    . hydroxychloroquine (PLAQUENIL) 200 MG tablet Take 1 tablet (200 mg total) by mouth daily. 30 tablet 2  . loperamide (IMODIUM A-D) 2 MG tablet Take 2 mg by mouth See admin instructions. Take one tablet (2 mg) by mouth on dialysis days (Tuesday, Thursday, Saturday)    . multivitamin (RENA-VIT) TABS tablet Take 1 tablet by mouth daily. (Patient taking differently: Take 1 tablet by mouth at bedtime. ) 30 tablet 0  . Nutritional Supplements (NOVASOURCE RENAL) LIQD Take 237 mLs by mouth See admin instructions. Take 1 container (237 mls) by mouth daily on dialysis days (Tuesday, Thursday, Saturday) - either during or after treatment    . albuterol (PROVENTIL HFA;VENTOLIN HFA) 108 (90 Base) MCG/ACT inhaler Inhale 1-2 puffs into the lungs every 6 (six) hours as needed for wheezing or shortness of breath. (Patient not taking: Reported on 08/18/2018) 6.7 g 0  . calcium acetate (PHOSLO) 667 MG capsule Take 2,001-4,002 mg by mouth See admin instructions. Take 6 capsule (4,002 mg) by mouth three times a day with each meal and 3 capsules (2,001 mg) with each snack    . cinacalcet (SENSIPAR) 30 MG tablet Take 1 tablet (30 mg total) by mouth daily with supper. (Patient not taking: Reported on 08/18/2018) 60 tablet 1   No facility-administered medications prior to visit.      ROS Review of Systems  Constitutional: Negative for chills, fever  and malaise/fatigue.  Eyes: Negative for blurred vision.  Respiratory: Negative for shortness of breath.   Cardiovascular: Negative for chest pain and palpitations.  Gastrointestinal: Negative for abdominal pain and nausea.  Genitourinary: Negative for dysuria and hematuria.  Musculoskeletal: Negative for joint pain and myalgias.       Bilateral leg pain  Skin: Negative for rash.  Neurological: Negative for tingling and headaches.  Psychiatric/Behavioral: Negative for depression. The patient is not nervous/anxious.     Objective:   BP 121/78 (BP Location: Right Arm, Patient Position: Sitting, Cuff Size: Small)   Pulse (!) 103   Temp 98 F (36.7 C) (Oral)   Ht 5\' 4"  (1.626 m)   Wt 111 lb 9.6 oz (50.6 kg)   SpO2 98%   BMI 19.16 kg/m   BP/Weight 08/18/2018 07/30/2018 4/62/7035  Systolic BP 009 381 829  Diastolic BP 78 96 74  Wt. (Lbs) 111.6 107.14 107.81  BMI 19.16 18.39 18.5      Physical Exam  Constitutional: She is oriented to person, place, and time.  Well developed, thin, NAD, polite  HENT:  Head: Normocephalic and atraumatic.  Eyes: No scleral icterus.  Neck: Normal range of motion. Neck supple. No thyromegaly present.  Cardiovascular: Normal rate, regular rhythm and normal heart sounds.  Pulmonary/Chest: Effort normal and breath sounds normal.  Abdominal: Soft. Bowel sounds are normal. There is no tenderness.  Musculoskeletal: She exhibits no edema or deformity.  No TTP of the leg bilaterally. Full aROM of the LE bilaterally.  Neurological: She is alert and oriented to person, place, and time.  Skin: Skin is warm and dry. No rash noted. No erythema. No pallor.  Psychiatric: She has a normal mood and affect. Her behavior is normal. Thought content normal.  Vitals reviewed.    Assessment & Plan:   1. Chronic systolic congestive heart failure ([place]) - Ambulatory referral to Cardiology  2. Hypertension, unspecified type - Ambulatory referral to Cardiology.  - Comprehensive metabolic panel; Future - TSH; Future - Lipid panel; Future - Refill carvedilol (COREG) 6.25 MG tablet; Take 1 tablet (6.25 mg total) by mouth 2 (two) times daily with a meal.  Dispense: 60 tablet; Refill: 2  3. End stage renal disease ([place]) - Continue care at dialysis and with nephrologist.  4. Lupus nephritis ([place]) - Ambulatory referral to Rheumatology  5. Skin lesion - RPR; Future  6. Medication refill - albuterol (PROVENTIL HFA;VENTOLIN HFA) 108 (90 Base) MCG/ACT inhaler; Inhale 1-2 puffs into the lungs every 6  (six) hours as needed for wheezing or shortness of breath.  Dispense: 18 g; Refill: 1  7. Bilateral leg pain - Refill acetaminophen (TYLENOL) 500 MG tablet; Take 1 tablet (500 mg total) by mouth every 8 (eight) hours as needed.  Dispense: 42 tablet; Refill: 0   Meds ordered this encounter  Medications  . albuterol (PROVENTIL HFA;VENTOLIN HFA) 108 (90 Base) MCG/ACT inhaler    Sig: Inhale 1-2 puffs into the lungs every 6 (six) hours as needed for wheezing or shortness of breath.    Dispense:  18 g    Refill:  1    Order Specific Question:   Supervising Provider    Answer:   Charlott Rakes [[id number]]  . carvedilol (COREG) 6.25 MG tablet    Sig: Take 1 tablet (6.25 mg total) by mouth 2 (two) times daily with a meal.    Dispense:  60 tablet    Refill:  2    Order Specific Question:   Supervising  Provider    Answer:   Charlott Rakes [[id number]]  . acetaminophen (TYLENOL) 500 MG tablet    Sig: Take 1 tablet (500 mg total) by mouth every 8 (eight) hours as needed.    Dispense:  42 tablet    Refill:  0    Order Specific Question:   Supervising Provider    Answer:   Charlott Rakes [[id number]]    Follow-up: Return in about 6 weeks (around 09/29/2018) for HTN.   Clent Demark PA

## 2018-08-18 NOTE — Patient Instructions (Signed)
Heart Failure °Heart failure means your heart has trouble pumping blood. This makes it hard for your body to work well. Heart failure is usually a long-term (chronic) condition. You must take good care of yourself and follow your doctor's treatment plan. °Follow these instructions at home: °· Take your heart medicine as told by your doctor. °? Do not stop taking medicine unless your doctor tells you to. °? Do not skip any dose of medicine. °? Refill your medicines before they run out. °? Take other medicines only as told by your doctor or pharmacist. °· Stay active if told by your doctor. The elderly and people with severe heart failure should talk with a doctor about physical activity. °· Eat heart-healthy foods. Choose foods that are without trans fat and are low in saturated fat, cholesterol, and salt (sodium). This includes fresh or frozen fruits and vegetables, fish, lean meats, fat-free or low-fat dairy foods, whole grains, and high-fiber foods. Lentils and dried peas and beans (legumes) are also good choices. °· Limit salt if told by your doctor. °· Cook in a healthy way. Roast, grill, broil, bake, poach, steam, or stir-fry foods. °· Limit fluids as told by your doctor. °· Weigh yourself every morning. Do this after you pee (urinate) and before you eat breakfast. Write down your weight to give to your doctor. °· Take your blood pressure and write it down if your doctor tells you to. °· Ask your doctor how to check your pulse. Check your pulse as told. °· Lose weight if told by your doctor. °· Stop smoking or chewing tobacco. Do not use gum or patches that help you quit without your doctor's approval. °· Schedule and go to doctor visits as told. °· Nonpregnant women should have no more than 1 drink a day. Men should have no more than 2 drinks a day. Talk to your doctor about drinking alcohol. °· Stop illegal drug use. °· Stay current with shots (immunizations). °· Manage your health conditions as told by your  doctor. °· Learn to manage your stress. °· Rest when you are tired. °· If it is really hot outside: °? Avoid intense activities. °? Use air conditioning or fans, or get in a cooler place. °? Avoid caffeine and alcohol. °? Wear loose-fitting, lightweight, and light-colored clothing. °· If it is really cold outside: °? Avoid intense activities. °? Layer your clothing. °? Wear mittens or gloves, a hat, and a scarf when going outside. °? Avoid alcohol. °· Learn about heart failure and get support as needed. °· Get help to maintain or improve your quality of life and your ability to care for yourself as needed. °Contact a doctor if: °· You gain weight quickly. °· You are more short of breath than usual. °· You cannot do your normal activities. °· You tire easily. °· You cough more than normal, especially with activity. °· You have any or more puffiness (swelling) in areas such as your hands, feet, ankles, or belly (abdomen). °· You cannot sleep because it is hard to breathe. °· You feel like your heart is beating fast (palpitations). °· You get dizzy or light-headed when you stand up. °Get help right away if: °· You have trouble breathing. °· There is a change in mental status, such as becoming less alert or not being able to focus. °· You have chest pain or discomfort. °· You faint. °This information is not intended to replace advice given to you by your health care provider. Make sure you   discuss any questions you have with your health care provider. °Document Released: 09/22/2008 Document Revised: 05/21/2016 Document Reviewed: 01/30/2013 °Elsevier Interactive Patient Education © 2017 [employer] ° °

## 2018-09-30 ENCOUNTER — Ambulatory Visit (INDEPENDENT_AMBULATORY_CARE_PROVIDER_SITE_OTHER): Payer: Medicaid Other | Admitting: Physician Assistant

## 2018-12-29 ENCOUNTER — Emergency Department (HOSPITAL_COMMUNITY)
Admission: EM | Admit: 2018-12-29 | Discharge: 2018-12-29 | Disposition: A | Discharge status: Home / Self Care | Payer: Medicaid Other | Attending: Emergency Medicine | Admitting: Emergency Medicine

## 2018-12-29 ENCOUNTER — Other Ambulatory Visit: Payer: Self-pay

## 2018-12-29 DIAGNOSIS — I5022 Chronic systolic (congestive) heart failure: Secondary | ICD-10-CM | POA: Diagnosis not present

## 2018-12-29 DIAGNOSIS — Z79899 Other long term (current) drug therapy: Secondary | ICD-10-CM | POA: Insufficient documentation

## 2018-12-29 DIAGNOSIS — I132 Hypertensive heart and chronic kidney disease with heart failure and with stage 5 chronic kidney disease, or end stage renal disease: Secondary | ICD-10-CM | POA: Diagnosis not present

## 2018-12-29 DIAGNOSIS — I471 Supraventricular tachycardia: Secondary | ICD-10-CM | POA: Diagnosis not present

## 2018-12-29 DIAGNOSIS — F17210 Nicotine dependence, cigarettes, uncomplicated: Secondary | ICD-10-CM | POA: Insufficient documentation

## 2018-12-29 DIAGNOSIS — N186 End stage renal disease: Secondary | ICD-10-CM | POA: Diagnosis not present

## 2018-12-29 DIAGNOSIS — R002 Palpitations: Secondary | ICD-10-CM | POA: Diagnosis present

## 2018-12-29 LAB — BASIC METABOLIC PANEL
Anion gap: 12 (ref 5–15)
BUN: 41 mg/dL — AB (ref 6–20)
CO2: 26 mmol/L (ref 22–32)
Calcium: 7.2 mg/dL — ABNORMAL LOW (ref 8.9–10.3)
Chloride: 102 mmol/L (ref 98–111)
Creatinine, Ser: 9.75 mg/dL — ABNORMAL HIGH (ref 0.44–1.00)
GFR calc Af Amer: 5 mL/min — ABNORMAL LOW (ref >60)
GFR calc non Af Amer: 5 mL/min — ABNORMAL LOW (ref >60)
Glucose, Bld: 66 mg/dL — ABNORMAL LOW (ref 70–99)
Potassium: 3.8 mmol/L (ref 3.5–5.1)
Sodium: 140 mmol/L (ref 135–145)

## 2018-12-29 LAB — CBC WITH DIFFERENTIAL/PLATELET
Abs Immature Granulocytes: 0.03 10*3/uL (ref 0.00–0.07)
Basophils Absolute: 0 10*3/uL (ref 0.0–0.1)
Basophils Relative: 0 %
Eosinophils Absolute: 0.1 10*3/uL (ref 0.0–0.5)
Eosinophils Relative: 2 %
HEMATOCRIT: 35.8 % — AB (ref 36.0–46.0)
HEMOGLOBIN: 10.5 g/dL — AB (ref 12.0–15.0)
Immature Granulocytes: 1 %
LYMPHS ABS: 0.5 10*3/uL — AB (ref 0.7–4.0)
LYMPHS PCT: 12 %
MCH: 26.7 pg (ref 26.0–34.0)
MCHC: 29.3 g/dL — ABNORMAL LOW (ref 30.0–36.0)
MCV: 91.1 fL (ref 80.0–100.0)
Monocytes Absolute: 0.3 10*3/uL (ref 0.1–1.0)
Monocytes Relative: 6 %
Neutro Abs: 3.1 10*3/uL (ref 1.7–7.7)
Neutrophils Relative %: 79 %
Platelets: 136 10*3/uL — ABNORMAL LOW (ref 150–400)
RBC: 3.93 MIL/uL (ref 3.87–5.11)
RDW: 16.4 % — ABNORMAL HIGH (ref 11.5–15.5)
WBC: 3.9 10*3/uL — ABNORMAL LOW (ref 4.0–10.5)
nRBC: 0 % (ref 0.0–0.2)

## 2018-12-29 LAB — MAGNESIUM: Magnesium: 1.9 mg/dL (ref 1.7–2.4)

## 2018-12-29 LAB — CBG MONITORING, ED: Glucose-Capillary: 75 mg/dL (ref 70–99)

## 2018-12-29 MED ORDER — ADENOSINE 6 MG/2ML IV SOLN: 12.0000 mg | Freq: Once | INTRAVENOUS | Status: DC

## 2018-12-29 MED ORDER — METOPROLOL TARTRATE 5 MG/5ML IV SOLN
INTRAVENOUS | Status: AC
  Administered 2018-12-29: 5 mg via INTRAVENOUS
  Filled 2018-12-29: qty 5

## 2018-12-29 MED ORDER — ADENOSINE 6 MG/2ML IV SOLN
18.0000 mg | Freq: Once | INTRAVENOUS | Status: AC
  Administered 2018-12-29: 18 mg via INTRAVENOUS

## 2018-12-29 MED ORDER — SODIUM CHLORIDE 0.9 % IV BOLUS
500.0000 mL | Freq: Once | INTRAVENOUS | Status: AC
  Administered 2018-12-29: 500 mL via INTRAVENOUS

## 2018-12-29 MED ORDER — METOPROLOL TARTRATE 5 MG/5ML IV SOLN
5.0000 mg | Freq: Once | INTRAVENOUS | Status: AC
  Administered 2018-12-29: 5 mg via INTRAVENOUS

## 2018-12-29 MED ORDER — ADENOSINE 6 MG/2ML IV SOLN
INTRAVENOUS | Status: AC
  Filled 2018-12-29: qty 6

## 2018-12-29 NOTE — ED Notes (Signed)
ED Provider at bedside capturing another EKG.

## 2018-12-29 NOTE — ED Notes (Signed)
Pt verbalized understanding of discharge instructions and denies any further questions at this time.   

## 2018-12-29 NOTE — ED Provider Notes (Signed)
[facility] EMERGENCY DEPARTMENT Provider Note   CSN: [id number] Arrival date & time: 12/29/18  0907     History   Chief Complaint Chief Complaint  Patient presents with  . SVT    HPI [name] is a 38 y.o. female.  HPI Patient is a 38 year old female presents the emergency department from her dialysis unit today with development of acute onset palpitations and bilateral jaw discomfort.  She has a history of SVT.  She was given 6 mg followed by 12 mg by EMS with brief conversion to controlled A. fib.  She received almost 2 hours of dialysis today.  She has no complaints of chest pain at this time.  Denies abdominal pain.  On my arrival to the room the patient is intermittently in and out of SVT.  She then developed sustained SVT requiring additional adenosine    Past Medical History:  Diagnosis Date  . Anemia    low iron - receives iron at dialysis  . Chronic systolic congestive heart failure ([place]) 03/16/2016  . Dyspnea   . ESRD (end stage renal disease) ([place])    [place] TTHSAT _ [place]  . H/O pericarditis 01/17/2013  . H/O pleural effusion 01/17/2013  . Heart murmur   . Lupus (systemic lupus erythematosus) (H[facility])    Previously followed with Dr. Charlestine Night, has not followed up recently  . 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 Pneumonia    many times  . Polysubstance abuse ([place])    cocaine, MJ, tobacco  . S/P pericardiocentesis 01/17/2013   H/o pericardial effusion with tamponade 2006   . Seizures ([place])    during pregnancy 1 time  . Streptococcal bacteremia 01/23/2013   She had two S. pneumonae bacteremia on 01/21/2013. Sensitive to Peniccilin     Patient Active Problem List   Diagnosis Date Noted  . Hyperkalemia 07/29/2018  . SVT (supraventricular tachycardia) ([place]) 07/16/2018  . ESRD (end stage  renal disease) on dialysis ([place]) 02/17/2017  . Chronic systolic congestive heart failure ([place]) 03/16/2016  . Other hypervolemia   . S/P thoracentesis   . Cough with hemoptysis   . Myalgia   . Pulmonary edema   . Rash and nonspecific skin eruption 06/26/2015  . Secondary Raynaud's phenomenon 06/24/2015  . Cramping of hands 04/19/2014  . Unspecified contraceptive management 04/19/2014  . End stage renal disease ([place]) 03/27/2014  . Insomnia 03/15/2014  . Benign hypertension with ESRD (end-stage renal disease) ([place]) 03/15/2014  . Tobacco abuse 02/15/2014  . Healthcare maintenance 02/15/2014  . Hypoalbuminemia 02/01/2014  . Nephrotic syndrome 02/01/2014  . ESRD on dialysis ([place]) 01/31/2014  . Pleural effusion, left 01/31/2014  . Microcytic anemia 01/29/2014  . Hypocalcemia 01/29/2014  . Cocaine abuse ([place]) 01/18/2013  . Marijuana smoker ([place]) 01/18/2013  . H/O pericarditis 01/17/2013  . SLE (systemic lupus erythematosus) ([place]) 01/17/2013  . Lupus nephritis ([place]) 01/17/2013  . S/P pericardiocentesis 01/17/2013  . H/O pleural effusion 01/17/2013  . Nephrosis 01/17/2013  . Preseptal cellulitis 01/17/2013    Past Surgical History:  Procedure Laterality Date  . AV FISTULA PLACEMENT    . BASCILIC VEIN TRANSPOSITION Left 02/05/2014   Procedure: [place];  Surgeon: [name], MD;  Location: [facility];  Service: Vascular;  Laterality: Left;  . FISTULA SUPERFICIALIZATION Left 05/30/2018   Procedure: FISTULA PLICATION BASILIC VEIN TRANSPOSITION;  Surgeon:  Angelia Mould, MD;  Location: [facility];  Service: Vascular;  Laterality: Left;  Marland Kitchen VENOGRAM Right 01/31/2014   Procedure: DIALYSIS CATHETER;  Surgeon: Serafina Mitchell, MD;  Location: [facility] CATH LAB;  Service: Cardiovascular;  Laterality: Right;     OB History   No obstetric history on file.      Home Medications    Prior to Admission medications   Medication Sig Start Date End Date Taking? Authorizing Provider    acetaminophen (TYLENOL) 500 MG tablet Take 2 tablets (1,000 mg total) by mouth 2 (two) times daily as needed. 07/30/18  Yes Molt, Bethany, DO  calcium acetate (PHOSLO) 667 MG capsule Take 2,001-4,002 mg by mouth See admin instructions. Take 6 capsule (4,002 mg) by mouth three times a day with each meal and 3 capsules (2,001 mg) with each snack   Yes [provider]  carvedilol (COREG) 6.25 MG tablet Take 1 tablet (6.25 mg total) by mouth 2 (two) times daily with a meal. 08/18/18  Yes Clent Demark, PA-C  cinacalcet (SENSIPAR) 30 MG tablet Take 1 tablet (30 mg total) by mouth daily with supper. 07/19/18  Yes Alphonzo Grieve, MD  darbepoetin (ARANESP) 60 MCG/0.3ML SOLN injection Inject 0.3 mLs (60 mcg total) into the vein every Wednesday with hemodialysis. Patient taking differently: Inject 60 mcg into the vein every Saturday.  02/08/14  Yes Jessee Avers, MD  diphenhydrAMINE (BENADRYL) 25 MG tablet Take 25-50 mg by mouth See admin instructions. Take one tablet (25 mg) by mouth twice daily on Sunday, Monday, Wednesday, Friday, take two tablets (50 mg) once daily at dialysis on Tuesday, Thursday, Saturday.   Yes [provider]  ferric gluconate 62.5 mg in sodium chloride 0.9 % 100 mL Inject 62.5 mg into the vein every Thursday with hemodialysis. 03/09/16  Yes Rice, Resa Miner, MD  hydroxychloroquine (PLAQUENIL) 200 MG tablet Take 1 tablet (200 mg total) by mouth daily. 07/19/18  Yes Alphonzo Grieve, MD  loperamide (IMODIUM A-D) 2 MG tablet Take 2 mg by mouth See admin instructions. Take one tablet (2 mg) by mouth on dialysis days (Tuesday, Thursday, Saturday)   Yes [provider]  multivitamin (RENA-VIT) TABS tablet Take 1 tablet by mouth daily. Patient taking differently: Take 1 tablet by mouth at bedtime.  02/18/17  Yes Holley Raring, MD  Nutritional Supplements (NOVASOURCE RENAL) LIQD Take 237 mLs by mouth See admin instructions. Take 1 container (237 mls) by mouth  daily on dialysis days (Tuesday, Thursday, Saturday) - either during or after treatment   Yes [provider]  acetaminophen (TYLENOL) 500 MG tablet Take 1 tablet (500 mg total) by mouth every 8 (eight) hours as needed. Patient not taking: Reported on 12/29/2018 08/18/18   Clent Demark, PA-C  albuterol (PROVENTIL HFA;VENTOLIN HFA) 108 (90 Base) MCG/ACT inhaler Inhale 1-2 puffs into the lungs every 6 (six) hours as needed for wheezing or shortness of breath. Patient not taking: Reported on 12/29/2018 08/18/18   Clent Demark, PA-C  diltiazem (CARDIZEM) 30 MG tablet Take 1 tablet (30 mg total) by mouth every 6 (six) hours as needed (SVT). 07/19/18   Alphonzo Grieve, MD  ferric citrate (AURYXIA) 1 GM 210 MG(Fe) tablet Take 210-420 mg by mouth See admin instructions. Take 2 tablets (420 mg) by mouth with meals and 1 tablet (210 mg) with snacks. Do not chew or crush medication. Swallow whole.    [provider]    Family History No family history on file.  Social History  Social History   Tobacco Use  . Smoking status: Current Every Day Smoker    Packs/day: 0.12    Years: 15.00    Pack years: 1.80    Types: Cigarettes  . Smokeless tobacco: Never Used  . Tobacco comment: quit smoking s/p hospital discharge/ SMOKES ABOUT 2-3 CIGARETTES A DAY  Substance Use Topics  . Alcohol use: Yes    Alcohol/week: 0.0 standard drinks    Comment: Special Occasional takes Vicar  . Drug use: Yes    Frequency: 5.0 times per week    Types: Marijuana, Cocaine    Comment: Marijuana last time mid May 2019, Cocaine- last tile mid April 2019     Allergies   Tobramycin sulfate   Review of Systems Review of Systems  All other systems reviewed and are negative.    Physical Exam Updated Vital Signs BP (!) 141/106   Pulse 88   Temp (!) 97.3 F (36.3 C) (Oral)   Resp 17   Ht 5\' 5"  (1.651 m)   Wt 55 kg Comment: before dialysis  SpO2 100%   BMI 20.18 kg/m   Physical Exam Vitals  signs and nursing note reviewed.  Constitutional:      General: She is not in acute distress.    Appearance: She is well-developed.  HENT:     Head: Normocephalic and atraumatic.  Neck:     Musculoskeletal: Normal range of motion.  Cardiovascular:     Rate and Rhythm: Regular rhythm. Tachycardia present.     Heart sounds: Normal heart sounds.  Pulmonary:     Effort: Pulmonary effort is normal.     Breath sounds: Normal breath sounds.  Abdominal:     General: There is no distension.     Palpations: Abdomen is soft.     Tenderness: There is no abdominal tenderness.  Musculoskeletal: Normal range of motion.  Skin:    General: Skin is warm and dry.  Neurological:     Mental Status: She is alert and oriented to person, place, and time.  Psychiatric:        Judgment: Judgment normal.      ED Treatments / Results  Labs (all labs ordered are listed, but only abnormal results are displayed) Labs Reviewed  BASIC METABOLIC PANEL - Abnormal; Notable for the following components:      Result Value   Glucose, Bld 66 (*)    BUN 41 (*)    Creatinine, Ser 9.75 (*)    Calcium 7.2 (*)    GFR calc non Af Amer 5 (*)    GFR calc Af Amer 5 (*)    All other components within normal limits  CBC WITH DIFFERENTIAL/PLATELET - Abnormal; Notable for the following components:   WBC 3.9 (*)    Hemoglobin 10.5 (*)    HCT 35.8 (*)    MCHC 29.3 (*)    RDW 16.4 (*)    Platelets 136 (*)    Lymphs Abs 0.5 (*)    All other components within normal limits  MAGNESIUM  CBG MONITORING, ED    EKG None  Radiology No results found.  Procedures .Critical Care Performed by: Jola Schmidt, MD Authorized by: Jola Schmidt, MD   Critical care provider statement:    Critical care time (minutes):  32   Critical care was time spent personally by me on the following activities:  Discussions with consultants, evaluation of patient's response to treatment, examination of patient, ordering and performing  treatments and interventions, ordering and  review of laboratory studies, ordering and review of radiographic studies, pulse oximetry, re-evaluation of patient's condition, obtaining history from patient or surrogate and review of old charts   (including critical care time)  Medications Ordered in ED Medications  metoprolol tartrate (LOPRESSOR) injection 5 mg (5 mg Intravenous Given 12/29/18 0921)  sodium chloride 0.9 % bolus 500 mL (0 mLs Intravenous Stopped 12/29/18 1009)  adenosine (ADENOCARD) 6 MG/2ML injection 18 mg (18 mg Intravenous Given 12/29/18 0925)     Initial Impression / Assessment and Plan / ED Course  I have reviewed the triage vital signs and the nursing notes.  Pertinent labs & imaging results that were available during my care of the patient were reviewed by me and considered in my medical decision making (see chart for details).    Hypotensive and tachycardic on arrival to the emergency department.  Concerning for SVT with poor cardiac output/perfusion.  Patient feels lightheaded.  Initial dose of adenosine without chemical conversion  Patient given 5 mg IV Lopressor.  Second dose of adenosine resulted in conversion to sinus rhythm.  12:42 PM Patient observed in the emergency department.  She is remained in sinus rhythm.  She feels much better at this time.  Her hypotension is resolved.  Patient is stable for discharge from the emergency department.  Close primary care follow-up.  Follow-up with cardiology as needed as well.  She is intermittently compliant with her medications which likely is her reason for recurrent SVT today.  She will contact her dialysis unit and reschedule the remainder of her dialysis for later today or tomorrow.  Patient understands return to the emergency department for new or worsening symptoms  Final Clinical Impressions(s) / ED Diagnoses   Final diagnoses:  SVT (supraventricular tachycardia) [facility])    ED Discharge Orders    None         Jola Schmidt, MD 12/29/18 1243

## 2018-12-29 NOTE — ED Triage Notes (Signed)
Per [facility]: Patient to ED from dialysis c/o palpitations and sudden onset of bilateral jaw discomfort upon starting dialysis this morning. Hx SVT - "usually requires multiple doses of adenosine. Was given 6mg  and 12mg  adenosine PTA with brief conversions to controlled A-Fib rhythm. Patient endorsing shortness of breath and lightheadedness. Only received 1.5 hours dialysis. A&O x 4.

## 2018-12-29 NOTE — ED Notes (Signed)
Patient given 18mg  adenosine IV.

## 2019-01-02 ENCOUNTER — Other Ambulatory Visit: Payer: Self-pay | Admitting: Internal Medicine

## 2019-01-20 ENCOUNTER — Encounter (HOSPITAL_COMMUNITY): Payer: Self-pay

## 2019-01-20 ENCOUNTER — Emergency Department (HOSPITAL_COMMUNITY): Payer: Medicaid Other

## 2019-01-20 ENCOUNTER — Other Ambulatory Visit: Payer: Self-pay

## 2019-01-20 ENCOUNTER — Emergency Department (HOSPITAL_COMMUNITY)
Admission: EM | Admit: 2019-01-20 | Discharge: 2019-01-20 | Disposition: A | Discharge status: Home / Self Care | Payer: Medicaid Other | Attending: Emergency Medicine | Admitting: Emergency Medicine

## 2019-01-20 DIAGNOSIS — F1410 Cocaine abuse, uncomplicated: Secondary | ICD-10-CM | POA: Insufficient documentation

## 2019-01-20 DIAGNOSIS — M25422 Effusion, left elbow: Secondary | ICD-10-CM | POA: Insufficient documentation

## 2019-01-20 DIAGNOSIS — I132 Hypertensive heart and chronic kidney disease with heart failure and with stage 5 chronic kidney disease, or end stage renal disease: Secondary | ICD-10-CM | POA: Insufficient documentation

## 2019-01-20 DIAGNOSIS — I5022 Chronic systolic (congestive) heart failure: Secondary | ICD-10-CM | POA: Diagnosis not present

## 2019-01-20 DIAGNOSIS — Z79899 Other long term (current) drug therapy: Secondary | ICD-10-CM | POA: Diagnosis not present

## 2019-01-20 DIAGNOSIS — N186 End stage renal disease: Secondary | ICD-10-CM | POA: Insufficient documentation

## 2019-01-20 DIAGNOSIS — M25522 Pain in left elbow: Secondary | ICD-10-CM | POA: Diagnosis present

## 2019-01-20 DIAGNOSIS — F17210 Nicotine dependence, cigarettes, uncomplicated: Secondary | ICD-10-CM | POA: Insufficient documentation

## 2019-01-20 DIAGNOSIS — Z992 Dependence on renal dialysis: Secondary | ICD-10-CM | POA: Insufficient documentation

## 2019-01-20 DIAGNOSIS — F1210 Cannabis abuse, uncomplicated: Secondary | ICD-10-CM | POA: Insufficient documentation

## 2019-01-20 LAB — CBC WITH DIFFERENTIAL/PLATELET
Abs Immature Granulocytes: 0.02 10*3/uL (ref 0.00–0.07)
Basophils Absolute: 0 10*3/uL (ref 0.0–0.1)
Basophils Relative: 0 %
Eosinophils Absolute: 0.1 10*3/uL (ref 0.0–0.5)
Eosinophils Relative: 3 %
HCT: 35.8 % — ABNORMAL LOW (ref 36.0–46.0)
Hemoglobin: 10.8 g/dL — ABNORMAL LOW (ref 12.0–15.0)
Immature Granulocytes: 1 %
Lymphocytes Relative: 18 %
Lymphs Abs: 0.7 10*3/uL (ref 0.7–4.0)
MCH: 26.8 pg (ref 26.0–34.0)
MCHC: 30.2 g/dL (ref 30.0–36.0)
MCV: 88.8 fL (ref 80.0–100.0)
Monocytes Absolute: 0.5 10*3/uL (ref 0.1–1.0)
Monocytes Relative: 12 %
Neutro Abs: 2.8 10*3/uL (ref 1.7–7.7)
Neutrophils Relative %: 66 %
Platelets: 132 10*3/uL — ABNORMAL LOW (ref 150–400)
RBC: 4.03 MIL/uL (ref 3.87–5.11)
RDW: 16.6 % — ABNORMAL HIGH (ref 11.5–15.5)
WBC: 4.2 10*3/uL (ref 4.0–10.5)
nRBC: 0 % (ref 0.0–0.2)

## 2019-01-20 LAB — BASIC METABOLIC PANEL
Anion gap: 15 (ref 5–15)
BUN: 40 mg/dL — ABNORMAL HIGH (ref 6–20)
CO2: 27 mmol/L (ref 22–32)
Calcium: 8.9 mg/dL (ref 8.9–10.3)
Chloride: 90 mmol/L — ABNORMAL LOW (ref 98–111)
Creatinine, Ser: 10.76 mg/dL — ABNORMAL HIGH (ref 0.44–1.00)
GFR calc Af Amer: 5 mL/min — ABNORMAL LOW (ref >60)
GFR calc non Af Amer: 4 mL/min — ABNORMAL LOW (ref >60)
Glucose, Bld: 81 mg/dL (ref 70–99)
Potassium: 5.5 mmol/L — ABNORMAL HIGH (ref 3.5–5.1)
Sodium: 132 mmol/L — ABNORMAL LOW (ref 135–145)

## 2019-01-20 MED ORDER — HYDROMORPHONE HCL 1 MG/ML IJ SOLN
0.5000 mg | Freq: Once | INTRAMUSCULAR | Status: AC
  Administered 2019-01-20: 0.5 mg via INTRAVENOUS
  Filled 2019-01-20: qty 1

## 2019-01-20 MED ORDER — MORPHINE SULFATE (PF) 4 MG/ML IV SOLN
4.0000 mg | Freq: Once | INTRAVENOUS | Status: AC
  Administered 2019-01-20: 4 mg via INTRAVENOUS
  Filled 2019-01-20: qty 1

## 2019-01-20 MED ORDER — LORAZEPAM 2 MG/ML IJ SOLN
1.0000 mg | Freq: Once | INTRAMUSCULAR | Status: AC
  Administered 2019-01-20: 1 mg via INTRAVENOUS
  Filled 2019-01-20: qty 1

## 2019-01-20 MED ORDER — METHYLPREDNISOLONE 4 MG PO TBPK: ORAL_TABLET | ORAL | 0 refills | Status: DC

## 2019-01-20 MED ORDER — OXYCODONE-ACETAMINOPHEN 5-325 MG PO TABS: 1.0000 | ORAL_TABLET | ORAL | 0 refills | Status: DC | PRN

## 2019-01-20 MED ORDER — LIDOCAINE HCL (PF) 1 % IJ SOLN
5.0000 mL | Freq: Once | INTRAMUSCULAR | Status: AC
  Administered 2019-01-20: 5 mL
  Filled 2019-01-20: qty 5

## 2019-01-20 NOTE — ED Provider Notes (Signed)
[facility] EMERGENCY DEPARTMENT Provider Note   CSN: [id number] Arrival date & time: 01/20/19  0908     History   Chief Complaint Chief Complaint  Patient presents with  . Arm Pain    HPI [name] is a 38 y.o. female.  HPI   38 year old female with left elbow pain.  Gradual onset about 2 days ago.  Progressively worsening since then.  Denies any acute trauma or strain.  She has a dialysis fistula in her left upper arm.  She reports no issues with her last treatment yesterday.  Denies any past history of similar symptoms.  Past Medical History:  Diagnosis Date  . Anemia    low iron - receives iron at dialysis  . Chronic systolic congestive heart failure ([place]) 03/16/2016  . Dyspnea   . ESRD (end stage renal disease) ([place])    [facility] [place]   . H/O pericarditis 01/17/2013  . H/O pleural effusion 01/17/2013  . Heart murmur   . Lupus (systemic lupus erythematosus) (H[facility])    Previously followed with Dr. Charlestine Night, has not followed up recently  . 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 Pneumonia    many times  . Polysubstance abuse ([place])    cocaine, MJ, tobacco  . S/P pericardiocentesis 01/17/2013   H/o pericardial effusion with tamponade 2006   . Seizures ([place])    during pregnancy 1 time  . Streptococcal bacteremia 01/23/2013   She had two S. pneumonae bacteremia on 01/21/2013. Sensitive to Peniccilin     Patient Active Problem List   Diagnosis Date Noted  . Hyperkalemia 07/29/2018  . SVT (supraventricular tachycardia) ([place]) 07/16/2018  . ESRD (end stage renal disease) on dialysis ([place]) 02/17/2017  . Chronic systolic congestive heart failure ([place]) 03/16/2016  . Other hypervolemia   . S/P thoracentesis   . Cough with hemoptysis   . Myalgia   . Pulmonary edema   . Rash and nonspecific  skin eruption 06/26/2015  . Secondary Raynaud's phenomenon 06/24/2015  . Cramping of hands 04/19/2014  . Unspecified contraceptive management 04/19/2014  . End stage renal disease ([place]) 03/27/2014  . Insomnia 03/15/2014  . Benign hypertension with ESRD (end-stage renal disease) ([place]) 03/15/2014  . Tobacco abuse 02/15/2014  . Healthcare maintenance 02/15/2014  . Hypoalbuminemia 02/01/2014  . Nephrotic syndrome 02/01/2014  . ESRD on dialysis ([place]) 01/31/2014  . Pleural effusion, left 01/31/2014  . Microcytic anemia 01/29/2014  . Hypocalcemia 01/29/2014  . Cocaine abuse ([place]) 01/18/2013  . Marijuana smoker ([place]) 01/18/2013  . H/O pericarditis 01/17/2013  . SLE (systemic lupus erythematosus) ([place]) 01/17/2013  . 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 S/P pericardiocentesis 01/17/2013  . H/O pleural effusion 01/17/2013  . Nephrosis 01/17/2013  . Preseptal cellulitis 01/17/2013    Past Surgical History:  Procedure Laterality Date  . AV FISTULA PLACEMENT    . BASCILIC VEIN TRANSPOSITION Left 02/05/2014   Procedure: [place];  Surgeon: [name], MD;  Location: [facility];  Service: Vascular;  Laterality: Left;  . FISTULA SUPERFICIALIZATION Left 05/30/2018   Procedure: FISTULA PLICATION BASILIC VEIN TRANSPOSITION;  Surgeon: Angelia Mould, MD;  Location: [facility];  Service: Vascular;  Laterality: Left;  Marland Kitchen VENOGRAM Right 01/31/2014   Procedure: DIALYSIS CATHETER;  Surgeon: Serafina [place], MD;  Location[facility] CATH LAB;  Service: Cardiovascular;  Laterality: Right;  OB History   No obstetric history on file.      Home Medications    Prior to Admission medications   Medication Sig Start Date End Date Taking? Authorizing Provider  acetaminophen (TYLENOL) 500 MG tablet Take 2 tablets (1,000 mg total) by mouth 2 (two) times daily as needed. Patient taking differently: Take 1,000 mg by mouth 2 (two) times daily as needed for mild pain.  07/30/18  Yes Molt, Bethany, DO    calcium acetate (PHOSLO) 667 MG capsule Take 2,001-4,002 mg by mouth See admin instructions. Take 6 capsule (4,002 mg) by mouth three times a day with each meal and 3 capsules (2,001 mg) with each snack   Yes [provider]  carvedilol (COREG) 6.25 MG tablet Take 1 tablet (6.25 mg total) by mouth 2 (two) times daily with a meal. 08/18/18  Yes Clent Demark, PA-C  cinacalcet (SENSIPAR) 30 MG tablet Take 1 tablet (30 mg total) by mouth daily with supper. 07/19/18  Yes Alphonzo Grieve, MD  darbepoetin (ARANESP) 60 MCG/0.3ML SOLN injection Inject 0.3 mLs (60 mcg total) into the vein every Wednesday with hemodialysis. Patient taking differently: Inject 60 mcg into the vein every Saturday.  02/08/14  Yes Jessee Avers, MD  diltiazem (CARDIZEM) 30 MG tablet Take 1 tablet (30 mg total) by mouth every 6 (six) hours as needed (SVT). Patient taking differently: Take 30 mg by mouth as needed (before dialysis).  07/19/18  Yes Alphonzo Grieve, MD  diphenhydrAMINE (BENADRYL) 25 MG tablet Take 25-50 mg by mouth See admin instructions. Take one tablet (25 mg) by mouth twice daily on Sunday, Monday, Wednesday, Friday, take two tablets (50 mg) once daily at dialysis on Tuesday, Thursday, Saturday.   Yes [provider]  ferric gluconate 62.5 mg in sodium chloride 0.9 % 100 mL Inject 62.5 mg into the vein every Thursday with hemodialysis. 03/09/16  Yes Rice, Resa Miner, MD  hydroxychloroquine (PLAQUENIL) 200 MG tablet Take 1 tablet (200 mg total) by mouth daily. 07/19/18  Yes Alphonzo Grieve, MD  loperamide (IMODIUM A-D) 2 MG tablet Take 2 mg by mouth See admin instructions. Take one tablet (2 mg) by mouth on dialysis days (Tuesday, Thursday, Saturday)   Yes [provider]  multivitamin (RENA-VIT) TABS tablet Take 1 tablet by mouth daily. Patient taking differently: Take 1 tablet by mouth at bedtime.  02/18/17  Yes Holley Raring, MD  Nutritional Supplements (NOVASOURCE RENAL) LIQD Take  237 mLs by mouth See admin instructions. Take 1 container (237 mls) by mouth daily on dialysis days (Tuesday, Thursday, Saturday) - either during or after treatment   Yes [provider]  acetaminophen (TYLENOL) 500 MG tablet Take 1 tablet (500 mg total) by mouth every 8 (eight) hours as needed. Patient not taking: Reported on 12/29/2018 08/18/18   Clent Demark, PA-C  albuterol (PROVENTIL HFA;VENTOLIN HFA) 108 (90 Base) MCG/ACT inhaler Inhale 1-2 puffs into the lungs every 6 (six) hours as needed for wheezing or shortness of breath. Patient not taking: Reported on 12/29/2018 08/18/18   Clent Demark, PA-C    Family History No family history on file.  Social History Social History   Tobacco Use  . Smoking status: Current Every Day Smoker    Packs/day: 0.12    Years: 15.00    Pack years: 1.80    Types: Cigarettes  . Smokeless tobacco: Never Used  . Tobacco comment: quit smoking s/p hospital discharge/ SMOKES ABOUT 2-3 CIGARETTES A DAY  Substance Use Topics  .  Alcohol use: Yes    Alcohol/week: 0.0 standard drinks    Comment: Special Occasional takes Vicar  . Drug use: Yes    Frequency: 5.0 times per week    Types: Marijuana, Cocaine    Comment: Marijuana last time mid May 2019, Cocaine- last tile mid April 2019     Allergies   Tobramycin sulfate   Review of Systems Review of Systems  All systems reviewed and negative, other than as noted in HPI.  Physical Exam Updated Vital Signs BP (!) 151/100 (BP Location: Right Arm)   Pulse 86   Temp 98.4 F (36.9 C) (Oral)   Resp 20   Ht 5\' 5"  (1.651 m)   Wt 53 kg   SpO2 100%   BMI 19.44 kg/m   Physical Exam Vitals signs and nursing note reviewed.  Constitutional:      General: She is not in acute distress.    Appearance: She is well-developed.  HENT:     Head: Normocephalic and atraumatic.  Eyes:     General:        Right eye: No discharge.        Left eye: No discharge.     Conjunctiva/sclera:  Conjunctivae normal.  Neck:     Musculoskeletal: Neck supple.  Cardiovascular:     Rate and Rhythm: Normal rate and regular rhythm.     Heart sounds: Normal heart sounds. No murmur. No friction rub. No gallop.   Pulmonary:     Effort: Pulmonary effort is normal. No respiratory distress.     Breath sounds: Normal breath sounds.  Abdominal:     General: There is no distension.     Palpations: Abdomen is soft.     Tenderness: There is no abdominal tenderness.  Musculoskeletal:       Arms:     Comments: Swelling and tenderness near the left elbow/posterior distal left upper arm.  This seems to be more in the region of the triceps tendon and not the olecranon bursa.  Palpable subcutaneous nodule in the same area.  It is mobile.  No overlying skin changes in terms of specific lesions or erythema.  Pain is sniffily increased with both flexion and extension actively or passively at the elbow.  She seems to tolerate pronation/supination reasonably well though.  Dialysis fistula with unremarkable appearance.  Palpable thrill.  Neurovascular intact distally.  Skin:    General: Skin is warm and dry.  Neurological:     Mental Status: She is alert.  Psychiatric:        Behavior: Behavior normal.        Thought Content: Thought content normal.      ED Treatments / Results  Labs (all labs ordered are listed, but only abnormal results are displayed) Labs Reviewed  CBC WITH DIFFERENTIAL/PLATELET - Abnormal; Notable for the following components:      Result Value   Hemoglobin 10.8 (*)    HCT 35.8 (*)    RDW 16.6 (*)    Platelets 132 (*)    All other components within normal limits  BASIC METABOLIC PANEL - Abnormal; Notable for the following components:   Sodium 132 (*)    Potassium 5.5 (*)    Chloride 90 (*)    BUN 40 (*)    Creatinine, Ser 10.76 (*)    GFR calc non Af Amer 4 (*)    GFR calc Af Amer 5 (*)    All other components within normal limits  CULTURE, BLOOD (ROUTINE  X 2)    CULTURE, BLOOD (ROUTINE X 2)    EKG None  Radiology Dg Elbow Complete Left  Result Date: 01/20/2019 CLINICAL DATA:  Pain and swelling. Dialysis fistula medial to the distal humerus. EXAM: LEFT ELBOW - COMPLETE 3+ VIEW COMPARISON:  None FINDINGS: Frontal, lateral, and bilateral oblique views were obtained. No acute fracture or dislocation. There is a small joint effusion. No joint space narrowing or erosion. There is extensive calcification anteriorly and medially consistent with extensive calcification of previously placed dialysis fistula. Surgical clips also present in these areas. IMPRESSION: Extensive calcification noted in previously placed dialysis fistula anteriorly and medially in the distal upper arm region. Surgical clips also present in these areas. No demonstrable fracture or dislocation. No erosive change or bony destruction. No appreciable joint space narrowing. Note that there is a small joint effusion which potentially could be of arthropathic etiology. Infected joint fluid can not be excluded radiographically. Electronically Signed   By: Lowella Grip III M.D.   On: 01/20/2019 11:04   Mr Elbow Left Wo Contrast  Result Date: 01/20/2019 CLINICAL DATA:  Acute elbow pain and swelling for the past 2 days. Left upper arm AV fistula. EXAM: MRI OF THE LEFT ELBOW WITHOUT CONTRAST TECHNIQUE: Multiplanar, multisequence MR imaging of the elbow was performed. No intravenous contrast was administered. COMPARISON:  Left elbow x-rays from same day. FINDINGS: TENDONS Common forearm flexor origin: Intact with normal signal. Common forearm extensor origin: Intact with normal signal. Biceps: Intact. Triceps: Intact with normal signal. LIGAMENTS Medial stabilizers: Intact. Lateral stabilizers: The lateral ulnar and radial collateral ligaments appear intact. Cartilage: Preserved.  No focal chondral defect demonstrated. Joint: Large joint effusion.  No loose body observed. Cubital tunnel: Unremarkable.   The ulnar nerve appears normal. Bones: No acute or significant extra-articular osseous findings. Other: Prominent edema within the distal triceps muscle. Partially visualized AV fistula with normal flow void. Mild soft tissue swelling along the medial and posterior elbow. No fluid collection or soft tissue mass. IMPRESSION: 1. Large elbow joint effusion. While this could be related to patient's underlying lupus, septic arthritis is not excluded. Correlate with arthrocentesis as clinically indicated. 2. Prominent edema within the distal triceps muscle consistent with myositis, likely inflammatory or infectious in etiology given absence of any trauma. This can be seen with lupus. Electronically Signed   By: Titus Dubin M.D.   On: 01/20/2019 16:25    Procedures .Joint Aspiration/Arthrocentesis Date/Time: 01/20/2019 5:23 PM Performed by: Virgel Manifold, MD Authorized by: Virgel Manifold, MD   Consent:    Consent obtained:  Verbal   Consent given by:  Patient   Risks discussed:  Bleeding and pain Location:    Location:  Elbow Anesthesia (see MAR for exact dosages):    Anesthesia method:  Local infiltration   Local anesthetic:  Lidocaine 1% w/o epi Procedure details:    Preparation: Patient was prepped and draped in usual sterile fashion     Needle gauge:  20 G   Ultrasound guidance: no     Approach:  Lateral (Elbow flexed to 90 degrees. bony landmarks of lateral epicondyle, radial head and olecranon identified)   Aspirate amount:  1cc   Aspirate characteristics:  Yellow and blood-tinged   Steroid injected: no     Specimen collected: yes   Post-procedure details:    Dressing:  Adhesive bandage   (including critical care time)  Medications Ordered in ED Medications  LORazepam (ATIVAN) injection 1 mg (has no administration in time range)  HYDROmorphone (DILAUDID) injection 0.5 mg (has no administration in time range)  morphine 4 MG/ML injection 4 mg (4 mg Intravenous Given 01/20/19 1109)      Initial Impression / Assessment and Plan / ED Course  I have reviewed the triage vital signs and the nursing notes.  Pertinent labs & imaging results that were available during my care of the patient were reviewed by me and considered in my medical decision making (see chart for details).     38 year old female with atraumatic left elbow pain.  Effusion on MRI.  I suspect that this is related to her underlying lupus.  Cannot completely rule it out arthritis.  Plan steroids and as needed pain medication if labs not consistent with septic joint.  Orthopedic consultation if it is.  Final Clinical Impressions(s) / ED Diagnoses   Final diagnoses:  Left elbow pain    ED Discharge Orders    None       Virgel Manifold, MD 01/20/19 1727

## 2019-01-20 NOTE — Discharge Instructions (Signed)
Your imaging today showed evidence of inflammation in your elbow and your muscles likely related to your lupus.  The aspiration did not show evidence of acute infection however a culture was sent.  You will be called if anything returns positive and concerning for infection.  Given your improvement in symptoms and your otherwise well appearance, we feel you are safe for discharge home.  Please take the steroid taper pack to help with the inflammation and use the pain medicine help with your discomfort.  Please follow-up with your primary doctor.  If any symptoms change or worsen, please return to the nearest emergency department.

## 2019-01-20 NOTE — ED Notes (Signed)
Patient transported to MRI 

## 2019-01-20 NOTE — ED Triage Notes (Signed)
Pt reports left arm pain for the past 2 days where her fistula is. Pt had dialysis yesterday with no problems but noticed a knot around her elbow with some swelling, tender to touch. Pt has recent clot removed six months ago. + radial pulse. NAD noted

## 2019-01-20 NOTE — ED Provider Notes (Signed)
5:20 PM Care assumed from Dr. Wilson Singer while awaiting for results of elbow aspiration analysis.  Patient has had several days of elbow pain that was atraumatic.  MRI showed no evidence of fracture but did show effusion.  Plan of care is to follow-up on results.  If results show a noninfected inflammatory process, suspect is related to her lupus and patient be discharged with pain medicine and steroids.  If concern for infection is discovered will likely touch base with orthopedics for evaluation.  8:14 PM Lab reports that there was not enough fluid to do all of the different test but they were able to do the culture and the Gram stain.  The grams did not show evidence of white blood cells or bacteria.  Given the patient's improvement in symptoms and the lack of bacteria seen, we have lower suspicion that the patient has a septic arthritis at this time.  Was informed of this and agreed with discharge with prescription for steroids and pain medication.  Patient understands that there still could be a small chance of infection and she will be called if the culture is positive.  She understand strict return precautions and follow-up instructions.  Patient had no other questions or concerns and was discharged in good condition.  Clinical Impression: 1. Left elbow pain     Disposition: Discharge  Condition: Good  I have discussed the results, Dx and Tx plan with the pt(& family if present). He/she/they expressed understanding and agree(s) with the plan. Discharge instructions discussed at great length. Strict return precautions discussed and pt &/or family have verbalized understanding of the instructions. No further questions at time of discharge.    New Prescriptions   METHYLPREDNISOLONE (MEDROL DOSEPAK) 4 MG TBPK TABLET    Please follow directions on Dosepak.   OXYCODONE-ACETAMINOPHEN (PERCOCET/ROXICET) 5-325 MG TABLET    Take 1 tablet by mouth every 4 (four) hours as needed for severe pain.     Follow Up: Clent Demark, PA-C [address] [place] [address] [facility] EMERG[facility]Y DEPARTMENT [address] [phone] mc [place]           Tegeler, Gwenyth Allegra, MD 01/20/19 2015

## 2019-01-20 NOTE — ED Notes (Signed)
Patient transported to X-ray 

## 2019-01-24 LAB — BODY FLUID CULTURE
Culture: NO GROWTH
Gram Stain: NONE SEEN

## 2019-01-25 LAB — CULTURE, BLOOD (ROUTINE X 2)
Culture: NO GROWTH
Special Requests: ADEQUATE

## 2019-04-25 IMAGING — DX DG CHEST 1V PORT
1 series · 1 of 1 positions shown · non-contrast
Comparison: 07/02/2018 and 06/21/2018.

CLINICAL DATA: Shortness of breath. 45 minutes episode of
supraventricular tachycardia during dialysis, converted to normal
sinus rhythm with adenosine. Cyanosis of the left fingers.

EXAM:
PORTABLE CHEST 1 VIEW

[chest ap]
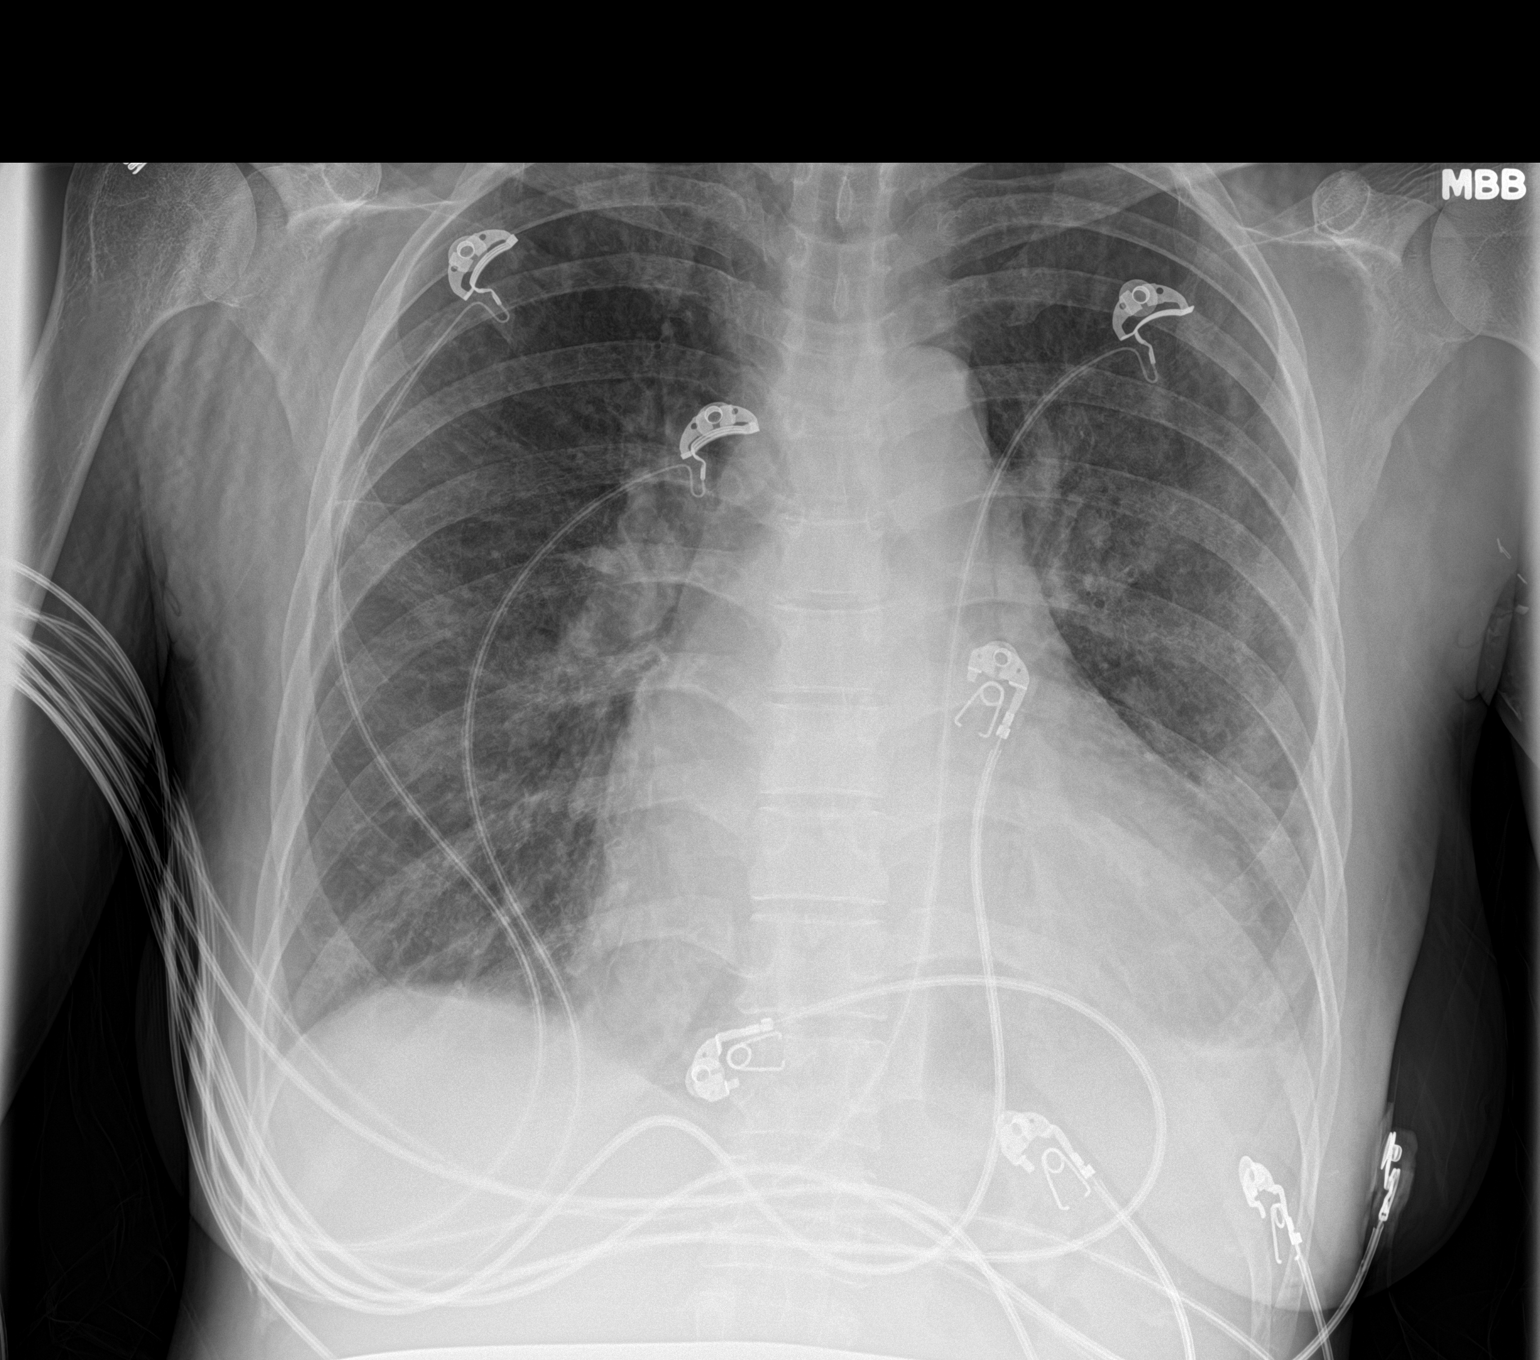

[1 of 1 positions shown; findings below may reference images not displayed]

FINDINGS: No significant change in enlargement of the cardiac silhouette and
patchy prominence of the interstitial markings in both lungs. Mild
pleural and parenchymal scarring at the left lung base with mild
improvement. Unremarkable bones.
IMPRESSION: Stable cardiomegaly and patchy chronic interstitial disease in both
lungs with mildly improved left basilar pleural and parenchymal
scarring.

## 2019-05-02 DIAGNOSIS — L0390 Cellulitis, unspecified: Secondary | ICD-10-CM | POA: Insufficient documentation

## 2019-07-29 ENCOUNTER — Encounter (HOSPITAL_COMMUNITY): Payer: Self-pay | Admitting: *Deleted

## 2019-07-29 ENCOUNTER — Emergency Department (HOSPITAL_COMMUNITY)
Admission: EM | Admit: 2019-07-29 | Discharge: 2019-07-29 | Disposition: A | Discharge status: Home / Self Care | Payer: Medicaid Other | Attending: Emergency Medicine | Admitting: Emergency Medicine

## 2019-07-29 ENCOUNTER — Other Ambulatory Visit: Payer: Self-pay

## 2019-07-29 DIAGNOSIS — N186 End stage renal disease: Secondary | ICD-10-CM | POA: Diagnosis not present

## 2019-07-29 DIAGNOSIS — R112 Nausea with vomiting, unspecified: Secondary | ICD-10-CM | POA: Insufficient documentation

## 2019-07-29 DIAGNOSIS — M3210 Systemic lupus erythematosus, organ or system involvement unspecified: Secondary | ICD-10-CM | POA: Diagnosis not present

## 2019-07-29 DIAGNOSIS — Z720 Tobacco use: Secondary | ICD-10-CM | POA: Diagnosis not present

## 2019-07-29 DIAGNOSIS — Z992 Dependence on renal dialysis: Secondary | ICD-10-CM | POA: Diagnosis not present

## 2019-07-29 DIAGNOSIS — R9431 Abnormal electrocardiogram [ECG] [EKG]: Secondary | ICD-10-CM

## 2019-07-29 DIAGNOSIS — R42 Dizziness and giddiness: Secondary | ICD-10-CM | POA: Diagnosis not present

## 2019-07-29 DIAGNOSIS — I4589 Other specified conduction disorders: Secondary | ICD-10-CM | POA: Diagnosis not present

## 2019-07-29 LAB — COMPREHENSIVE METABOLIC PANEL
ALT: 12 U/L (ref 0–44)
AST: 21 U/L (ref 15–41)
Albumin: 2.8 g/dL — ABNORMAL LOW (ref 3.5–5.0)
Alkaline Phosphatase: 66 U/L (ref 38–126)
Anion gap: 14 (ref 5–15)
BUN: 37 mg/dL — ABNORMAL HIGH (ref 6–20)
CO2: 30 mmol/L (ref 22–32)
Calcium: 9.6 mg/dL (ref 8.9–10.3)
Chloride: 92 mmol/L — ABNORMAL LOW (ref 98–111)
Creatinine, Ser: 5.57 mg/dL — ABNORMAL HIGH (ref 0.44–1.00)
GFR calc Af Amer: 11 mL/min — ABNORMAL LOW (ref >60)
GFR calc non Af Amer: 9 mL/min — ABNORMAL LOW (ref >60)
Glucose, Bld: 115 mg/dL — ABNORMAL HIGH (ref 70–99)
Potassium: 4.1 mmol/L (ref 3.5–5.1)
Sodium: 136 mmol/L (ref 135–145)
Total Bilirubin: 0.7 mg/dL (ref 0.3–1.2)
Total Protein: 9.3 g/dL — ABNORMAL HIGH (ref 6.5–8.1)

## 2019-07-29 LAB — CBC
HCT: 36.4 % (ref 36.0–46.0)
Hemoglobin: 11.2 g/dL — ABNORMAL LOW (ref 12.0–15.0)
MCH: 28.5 pg (ref 26.0–34.0)
MCHC: 30.8 g/dL (ref 30.0–36.0)
MCV: 92.6 fL (ref 80.0–100.0)
Platelets: 324 10*3/uL (ref 150–400)
RBC: 3.93 MIL/uL (ref 3.87–5.11)
RDW: 19.5 % — ABNORMAL HIGH (ref 11.5–15.5)
WBC: 5.8 10*3/uL (ref 4.0–10.5)
nRBC: 4.9 % — ABNORMAL HIGH (ref 0.0–0.2)

## 2019-07-29 LAB — LIPASE, BLOOD: Lipase: 81 U/L — ABNORMAL HIGH (ref 11–51)

## 2019-07-29 LAB — I-STAT BETA HCG BLOOD, ED (MC, WL, AP ONLY): I-stat hCG, quantitative: <5 m[IU]/mL (ref <5)

## 2019-07-29 MED ORDER — SODIUM CHLORIDE 0.9% FLUSH
3.0000 mL | Freq: Once | INTRAVENOUS | Status: AC
  Administered 2019-07-29: 3 mL via INTRAVENOUS

## 2019-07-29 MED ORDER — HYDROXYZINE HCL 10 MG PO TABS: 10.0000 mg | ORAL_TABLET | Freq: Three times a day (TID) | ORAL | 0 refills | Status: DC | PRN

## 2019-07-29 MED ORDER — LORAZEPAM 2 MG/ML IJ SOLN
1.0000 mg | Freq: Once | INTRAMUSCULAR | Status: AC
  Administered 2019-07-29: 1 mg via INTRAVENOUS
  Filled 2019-07-29: qty 1

## 2019-07-29 NOTE — ED Notes (Signed)
Pt drank some of her drink abt an hour ago and was able to keep it down w/out vomiting.

## 2019-07-29 NOTE — ED Provider Notes (Signed)
[facility] EMERGENCY DEPARTMENT Provider Note   CSN: [id number] Arrival date & time: 07/29/19  1429    History   Chief Complaint Chief Complaint  Patient presents with   Emesis   Dizziness    HPI [name] is a 38 y.o. female presenting for evaluation of dizziness and nausea/vomiting.  Patient states she completed her full dialysis session, which she did not not normally do.  About 1 hour afterwards, she started to feel dizzy and weak, she was about to pass out.  She then had multiple episodes of emesis.  Currently the dizziness has completely resolved, but she is still feeling nauseous.  She denies fevers, chills, chest pain, shortness of breath, cough, abdominal pain, abnormal bowel movements.  She produces little to no urine.  She has been on dialysis for the past 5 years secondary to lupus.  She is not on immunosuppression.  She also reports a history of CHF for which she takes medication.  Patient smokes cigarettes daily, drinks alcohol occasionally, none today or yesterday.  She reports marijuana and cocaine use, last used 3 days ago. She denies sick contacts. She denies known exposure to covid 19 positive person.   Additional history obtained from chart review. Pt with h/o chf, esrd on dialysis (goes tu, thur sat), lupus, polysubstance abuse.      HPI  Past Medical History:  Diagnosis Date   Anemia    low iron - receives iron at dialysis   Chronic systolic congestive heart failure ([place]) 03/16/2016   Dyspnea    ESRD (end stage renal disease) ([place])    [place] TTHSAT _ [place]   H/O pericarditis 01/17/2013   H/O pleural effusion 01/17/2013   Heart murmur    Lupus (systemic lupus erythematosus) (HCC)    Previously followed with Dr. 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 pneumonae bacteremia on 01/21/2013. Sensitive to Peniccilin     Patient Active Problem List   Diagnosis Date Noted   Hyperkalemia 07/29/2018   SVT (supraventricular tachycardia) ([place]) 07/16/2018   ESRD (end stage renal disease) on dialysis ([place]) 28/78/6767   Chronic systolic congestive heart failure ([place]) 03/16/2016   Other hypervolemia    S/P thoracentesis    Cough with hemoptysis    Myalgia    Pulmonary edema    Rash and nonspecific skin eruption 06/26/2015   Secondary Raynaud's phenomenon 06/24/2015   Cramping of hands 04/19/2014   Unspecified contraceptive management 04/19/2014   End stage renal disease ([place]) 03/27/2014   Insomnia 03/15/2014   Benign hypertension with ESRD (end-stage renal disease) ([place]) 03/15/2014   Tobacco abuse 02/15/2014   Healthcare maintenance 02/15/2014   Hypoalbuminemia 02/01/2014   Nephrotic syndrome 02/01/2014   ESRD on dialysis ([place]) 01/31/2014   Pleural effusion, left 01/31/2014   Microcytic anemia 01/29/2014   Hypocalcemia 01/29/2014   Cocaine abuse ([place]) 01/18/2013   Marijuana smoker ([place]) 01/18/2013   H/O pericarditis 01/17/2013   SLE (systemic lupus erythematosus) ([place]) 01/17/2013   Lupus nephritis ([place]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/17/2013  S/P pericardiocentesis 01/17/2013   H/O pleural effusion 01/17/2013   Nephrosis 01/17/2013   Preseptal cellulitis 01/17/2013    Past Surgical History:  Procedure Laterality Date   AV FISTULA PLACEMENT     BASCILIC VEIN TRANSPOSITION Left 02/05/2014   Procedure: BASCILIC VEIN TRANSPOSITION;  Surgeon: [name], MD;  Location: [place];  Service: Vascular;  Laterality: Left;   FISTULA SUPERFICIALIZATION  Left 05/30/2018   Procedure: FISTULA PLICATION BASILIC VEIN TRANSPOSITION;  Surgeon: Angelia Mould, MD;  Location: [place];  Service: Vascular;  Laterality: Left;   VENOGRAM Right 01/31/2014   Procedure: DIALYSIS CATHETER;  Surgeon: Serafina Mitchell, MD;  Location[facility] C[facility] LAB;  Service: Cardiovascular;  Laterality: Right;     OB History   No obstetric history on file.      Home Medications    Prior to Admission medications   Medication Sig Start Date End Date Taking? Authorizing Provider  acetaminophen (TYLENOL) 500 MG tablet Take 2 tablets (1,000 mg total) by mouth 2 (two) times daily as needed. Patient taking differently: Take 1,000 mg by mouth 2 (two) times daily as needed for mild pain.  07/30/18   Molt, Bethany, DO  acetaminophen (TYLENOL) 500 MG tablet Take 1 tablet (500 mg total) by mouth every 8 (eight) hours as needed. Patient not taking: Reported on 12/29/2018 08/18/18   Clent Demark, PA-C  albuterol (PROVENTIL HFA;VENTOLIN HFA) 108 (90 Base) MCG/ACT inhaler Inhale 1-2 puffs into the lungs every 6 (six) hours as needed for wheezing or shortness of breath. Patient not taking: Reported on 12/29/2018 08/18/18   Clent Demark, PA-C  calcium acetate (PHOSLO) 667 MG capsule Take 2,001-4,002 mg by mouth See admin instructions. Take 6 capsule (4,002 mg) by mouth three times a day with each meal and 3 capsules (2,001 mg) with each snack    [provider]  carvedilol (COREG) 6.25 MG tablet Take 1 tablet (6.25 mg total) by mouth 2 (two) times daily with a meal. 08/18/18   Clent Demark, PA-C  cinacalcet (SENSIPAR) 30 MG tablet Take 1 tablet (30 mg total) by mouth daily with supper. 07/19/18   Alphonzo Grieve, MD  darbepoetin (ARANESP) 60 MCG/0.3ML SOLN injection Inject 0.3 mLs (60 mcg total) into the vein every Wednesday with hemodialysis. Patient taking differently: Inject 60 mcg into the vein every Saturday.  02/08/14   Jessee Avers, MD  diltiazem (CARDIZEM) 30 MG  tablet Take 1 tablet (30 mg total) by mouth every 6 (six) hours as needed (SVT). Patient taking differently: Take 30 mg by mouth as needed (before dialysis).  07/19/18   Alphonzo Grieve, MD  diphenhydrAMINE (BENADRYL) 25 MG tablet Take 25-50 mg by mouth See admin instructions. Take one tablet (25 mg) by mouth twice daily on Sunday, Monday, Wednesday, Friday, take two tablets (50 mg) once daily at dialysis on Tuesday, Thursday, Saturday.    [provider]  ferric gluconate 62.5 mg in sodium chloride 0.9 % 100 mL Inject 62.5 mg into the vein every Thursday with hemodialysis. 03/09/16   Rice, Resa Miner, MD  hydroxychloroquine (PLAQUENIL) 200 MG tablet Take 1 tablet (200 mg total) by mouth daily. 07/19/18   Alphonzo Grieve, MD  loperamide (IMODIUM A-D) 2 MG tablet Take 2 mg by mouth See admin instructions. Take one tablet (2 mg) by mouth on dialysis days (Tuesday, Thursday, Saturday)    [provider]  methylPREDNISolone (MEDROL DOSEPAK) 4 MG TBPK tablet Please follow directions on Dosepak. 01/20/19   Tegeler, Harrell Gave  J, MD  multivitamin (RENA-VIT) TABS tablet Take 1 tablet by mouth daily. Patient taking differently: Take 1 tablet by mouth at bedtime.  02/18/17   Holley Raring, MD  Nutritional Supplements (NOVASOURCE RENAL) LIQD Take 237 mLs by mouth See admin instructions. Take 1 container (237 mls) by mouth daily on dialysis days (Tuesday, Thursday, Saturday) - either during or after treatment    [provider]  oxyCODONE-acetaminophen (PERCOCET/ROXICET) 5-325 MG tablet Take 1 tablet by mouth every 4 (four) hours as needed for severe pain. 01/20/19   Tegeler, Gwenyth Allegra, MD    Family History No family history on file.  Social History Social History   Tobacco Use   Smoking status: Current Every Day Smoker    Packs/day: 0.12    Years: 15.00    Pack years: 1.80    Types: Cigarettes   Smokeless tobacco: Never Used   Tobacco comment: quit smoking s/p hospital  discharge/ SMOKES ABOUT 2-3 CIGARETTES A DAY  Substance Use Topics   Alcohol use: Yes    Alcohol/week: 0.0 standard drinks    Comment: Special Occasional takes Vicar   Drug use: Yes    Frequency: 5.0 times per week    Types: Marijuana, Cocaine    Comment: Marijuana last time mid May 2019, Cocaine- last tile mid April 2019     Allergies   Tobramycin sulfate   Review of Systems Review of Systems  Eyes:       Blurry vision with dizziness, resolved  Gastrointestinal: Positive for nausea and vomiting.  Neurological: Positive for dizziness (resolved).  All other systems reviewed and are negative.    Physical Exam Updated Vital Signs BP (!) 128/97    Pulse (!) 103    Temp 98.2 F (36.8 C) (Oral)    Resp (!) 25    SpO2 99%   Physical Exam Vitals signs and nursing note reviewed.  Constitutional:      General: She is not in acute distress.    Appearance: She is well-developed.     Comments: Appears nontoxic  HENT:     Head: Normocephalic and atraumatic.  Eyes:     Extraocular Movements: Extraocular movements intact.     Conjunctiva/sclera: Conjunctivae normal.     Pupils: Pupils are equal, round, and reactive to light.  Neck:     Musculoskeletal: Normal range of motion and neck supple.  Cardiovascular:     Rate and Rhythm: Normal rate and regular rhythm.     Pulses: Normal pulses.  Pulmonary:     Effort: Pulmonary effort is normal. No respiratory distress.     Breath sounds: Normal breath sounds. No wheezing.     Comments: Speaking in full sentences. Clear lung sounds in all fields.  Abdominal:     General: There is no distension.     Palpations: Abdomen is soft. There is no mass.     Tenderness: There is no abdominal tenderness. There is no guarding or rebound.     Comments: No ttp of the abd. Soft without rigidity, guarding, distention. Negative rebound. No signs of peritonitis.   Musculoskeletal: Normal range of motion.     Comments: No leg swelling  Skin:     General: Skin is warm and dry.     Capillary Refill: Capillary refill takes less than 2 seconds.  Neurological:     Mental Status: She is alert and oriented to person, place, and time.     Comments: No obvious neuro deficits.  ED Treatments / Results  Labs (all labs ordered are listed, but only abnormal results are displayed) Labs Reviewed  LIPASE, BLOOD - Abnormal; Notable for the following components:      Result Value   Lipase 81 (*)    All other components within normal limits  COMPREHENSIVE METABOLIC PANEL - Abnormal; Notable for the following components:   Chloride 92 (*)    Glucose, Bld 115 (*)    BUN 37 (*)    Creatinine, Ser 5.57 (*)    Total Protein 9.3 (*)    Albumin 2.8 (*)    GFR calc non Af Amer 9 (*)    GFR calc Af Amer 11 (*)    All other components within normal limits  CBC - Abnormal; Notable for the following components:   Hemoglobin 11.2 (*)    RDW 19.5 (*)    nRBC 4.9 (*)    All other components within normal limits  URINALYSIS, ROUTINE W REFLEX MICROSCOPIC  I-STAT BETA HCG BLOOD, ED ([facility], [facility], [facility] ONLY)    EKG EKG Interpretation  Date/Time:  Saturday July 29 2019 14:39:37 EDT Ventricular Rate:  99 PR Interval:  160 QRS Duration: 86 QT Interval:  402 QTC Calculation: 515 R Axis:   81 Text Interpretation:  Normal sinus rhythm Biatrial enlargement Left ventricular hypertrophy Prolonged QT Abnormal ECG Confirmed by Fredia Sorrow [phone]) on 07/29/2019 3:58:32 PM   Radiology No results found.  Procedures Procedures (including critical care time)  Medications Ordered in ED Medications  sodium chloride flush (NS) 0.9 % injection 3 mL (3 mLs Intravenous Given 07/29/19 1621)  LORazepam (ATIVAN) injection 1 mg (1 mg Intravenous Given 07/29/19 1621)     Initial Impression / Assessment and Plan / ED Course  I have reviewed the triage vital signs and the nursing notes.  Pertinent labs & imaging results that were available during my care of the  patient were reviewed by me and considered in my medical decision making (see chart for details).        Patient presenting for evaluation of dizziness, nausea, vomiting.  Physical exam reassuring, patient is nontoxic.  No abdominal tenderness.  Dizziness has resolved, no obvious neurologic deficits.  Symptoms began after completing dialysis treatment, patient states she normally does not due to muscle cramping.  As such, concern that too much fluid was pulled off, which may be contributing to her symptoms.  Will check labs to assess hemoglobin and electrolytes.  EKG for dizziness.  EKG with slightly prolonged QT.  As such we will hold on antiemetics, Ativan for nausea and vomiting.  Patient without change in blood pressure during orthostatics.  Became slightly tachycardic, but heart rate normalized after standing for 3 minutes.  She was asymptomatic during orthostatics.  Labs show baseline chronic anemia at 11.2.  Creatinine improved from previous at 5.5.  BUN at baseline.  Lipase mildly elevated at 81, but patient without abdominal pain and nausea has resolved.  Doubt pancreatitis at this time.  As such, will hold on CT scan.  Will p.o. challenge and reassess.  Patient tolerating p.o. without difficulty.  No further vomiting.  Symptoms likely due to dialysis.  Will encourage patient to follow-up with her nephrologist.  At this time, patient appears safe for discharge.  Return precautions given.  Patient states she understands and agrees to plan.   Final Clinical Impressions(s) / ED Diagnoses   Final diagnoses:  Non-intractable vomiting with nausea, unspecified vomiting type  Dizziness  Prolonged Q-T interval on  ECG    ED Discharge Orders    None       Franchot Heidelberg, PA-C 07/29/19 1912    Fredia Sorrow, MD 08/04/19 1550

## 2019-07-29 NOTE — ED Notes (Signed)
Pt given dc instructions pt verbalizes understanding. Pt wheeled out to the lobby.

## 2019-07-29 NOTE — Discharge Instructions (Addendum)
It is important that you follow-up with your kidney doctor about the amount of fluid that is being taken off on dialysis. Make sure eating and drinking well today. Return to the emergency room if you develop high fevers, chest pain, shortness of breath, severe abdominal pain, persistent vomiting, or any new, worsening, or concerning symptoms.

## 2019-07-29 NOTE — ED Triage Notes (Signed)
To ED for eval of nausea, vomiting, and dizziness that started approx 30 pta. Pt finished a full dialysis approx 1pm. Vomiting in triage.

## 2019-07-31 LAB — PATHOLOGIST SMEAR REVIEW

## 2019-08-15 DIAGNOSIS — M79603 Pain in arm, unspecified: Secondary | ICD-10-CM | POA: Insufficient documentation

## 2019-08-15 DIAGNOSIS — M25512 Pain in left shoulder: Secondary | ICD-10-CM | POA: Insufficient documentation

## 2019-08-16 ENCOUNTER — Other Ambulatory Visit: Payer: Self-pay

## 2019-08-16 ENCOUNTER — Ambulatory Visit (INDEPENDENT_AMBULATORY_CARE_PROVIDER_SITE_OTHER): Payer: Medicaid Other | Admitting: Physician Assistant

## 2019-08-16 ENCOUNTER — Ambulatory Visit (HOSPITAL_COMMUNITY)
Admission: RE | Admit: 2019-08-16 | Discharge: 2019-08-16 | Disposition: A | Discharge status: Home / Self Care | Payer: Medicaid Other | Source: Ambulatory Visit | Attending: Family | Admitting: Family

## 2019-08-16 ENCOUNTER — Other Ambulatory Visit: Payer: Self-pay | Admitting: Vascular Surgery

## 2019-08-16 ENCOUNTER — Encounter: Payer: Self-pay | Admitting: Physician Assistant

## 2019-08-16 VITALS — BP 121/81 | HR 93 | Temp 98.3°F | Resp 20 | Ht 64.0 in | Wt 110.0 lb

## 2019-08-16 DIAGNOSIS — Z992 Dependence on renal dialysis: Secondary | ICD-10-CM

## 2019-08-16 DIAGNOSIS — N186 End stage renal disease: Secondary | ICD-10-CM

## 2019-08-16 DIAGNOSIS — R200 Anesthesia of skin: Secondary | ICD-10-CM

## 2019-08-16 NOTE — Progress Notes (Signed)
History of Present Illness:  Patient is a 38 y.o. year old female who presents  Examination of her left AV fistula.  She states she had purulent drainage at a stick site on Friday.  She denise fever and chills and has not had any drainage since Friday.  The HD center started IV antibiotics and took cultures.     Last seen in June of 2019 with a large aneurysmal left upper arm fistula.  This is a basilic vein transposition.  The skin overlying the more central aneurysm was standing and she presented for plication.  She denise problems or concerns since this procedure.       Past Medical History:  Diagnosis Date  . Anemia    low iron - receives iron at dialysis  . Chronic systolic congestive heart failure ([place]) 03/16/2016  . Dyspnea   . ESRD (end stage renal disease) ([place])    [place] TTHSAT _ [place]  . H/O pericarditis 01/17/2013  . H/O pleural effusion 01/17/2013  . Heart murmur   . Lupus (systemic lupus erythematosus) (HCC)    Previously followed with Dr. Charlestine Night, has not followed up recently  . 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 Pneumonia    many times  . Polysubstance abuse ([place])    cocaine, MJ, tobacco  . S/P pericardiocentesis 01/17/2013   H/o pericardial effusion with tamponade 2006   . Seizures ([place])    during pregnancy 1 time  . Streptococcal bacteremia 01/23/2013   She had two S. pneumonae bacteremia on 01/21/2013. Sensitive to Peniccilin     Past Surgical History:  Procedure Laterality Date  . AV FISTULA PLACEMENT    . BASCILIC VEIN TRANSPOSITION Left 02/05/2014   Procedure: [place];  Surgeon: [name], MD;  Location: [place];  Service: Vascular;  Laterality: Left;  . FISTULA SUPERFICIALIZATION Left 05/30/2018   Procedure: FISTULA PLICATION BASILIC VEIN TRANSPOSITION;  Surgeon: Angelia Mould, MD;  Location: [place];  Service: Vascular;  Laterality: Left;  [facility] [place] VENOGRAM Right 01/31/2014   Procedure: DIALYSIS CATHETER;  Surgeon: Serafina Mitchell, MD;  Location: [facility] CATH LAB;  Service: Cardiovascular;  Laterality: Right;     Social History Social History   Tobacco Use  . Smoking status: Current Every Day Smoker    Packs/day: 0.12    Years: 15.00    Pack years: 1.80    Types: Cigarettes  . Smokeless tobacco: Never Used  . Tobacco comment: quit smoking s/p hospital discharge/ SMOKES ABOUT 2-3 CIGARETTES A DAY  Substance Use Topics  . Alcohol use: Yes    Alcohol/week: 0.0 standard drinks    Comment: Special Occasional takes Vicar  . Drug use: Yes    Frequency: 5.0 times per week    Types: Marijuana, Cocaine    Comment: Marijuana last time mid May 2019, Cocaine- last tile mid April 2019    Family History No family history on file.  Allergies  Allergies  Allergen Reactions  . Tobramycin Sulfate Swelling     Current Outpatient Medications  Medication Sig Dispense Refill  . acetaminophen (TYLENOL) 500 MG tablet Take 2 tablets (1,000 mg total) by mouth 2 (two) times daily as needed. (Patient taking differently: Take 1,000 mg by mouth 2 (two) times daily as needed for mild pain. ) 30 tablet 0  . acetaminophen (TYLENOL) 500 MG  tablet Take 1 tablet (500 mg total) by mouth every 8 (eight) hours as needed. (Patient not taking: Reported on 12/29/2018) 42 tablet 0  . albuterol (PROVENTIL HFA;VENTOLIN HFA) 108 (90 Base) MCG/ACT inhaler Inhale 1-2 puffs into the lungs every 6 (six) hours as needed for wheezing or shortness of breath. (Patient not taking: Reported on 12/29/2018) 18 g 1  . calcium acetate (PHOSLO) 667 MG capsule Take 2,001-4,002 mg by mouth See admin instructions. Take 6 capsule (4,002 mg) by mouth three times a day with each meal and 3 capsules (2,001 mg) with each snack    . carvedilol (COREG) 6.25 MG tablet Take 1 tablet (6.25 mg total) by mouth 2 (two) times  daily with a meal. 60 tablet 2  . cinacalcet (SENSIPAR) 30 MG tablet Take 1 tablet (30 mg total) by mouth daily with supper. 60 tablet 1  . darbepoetin (ARANESP) 60 MCG/0.3ML SOLN injection Inject 0.3 mLs (60 mcg total) into the vein every Wednesday with hemodialysis. (Patient taking differently: Inject 60 mcg into the vein every Saturday. ) 4.2 mL 0  . diltiazem (CARDIZEM) 30 MG tablet Take 1 tablet (30 mg total) by mouth every 6 (six) hours as needed (SVT). (Patient taking differently: Take 30 mg by mouth as needed (before dialysis). ) 60 tablet 1  . diphenhydrAMINE (BENADRYL) 25 MG tablet Take 25-50 mg by mouth See admin instructions. Take one tablet (25 mg) by mouth twice daily on Sunday, Monday, Wednesday, Friday, take two tablets (50 mg) once daily at dialysis on Tuesday, Thursday, Saturday.    . ferric gluconate 62.5 mg in sodium chloride 0.9 % 100 mL Inject 62.5 mg into the vein every Thursday with hemodialysis.    . hydroxychloroquine (PLAQUENIL) 200 MG tablet Take 1 tablet (200 mg total) by mouth daily. 30 tablet 2  . hydrOXYzine (ATARAX/VISTARIL) 10 MG tablet Take 1 tablet (10 mg total) by mouth 3 (three) times daily as needed for anxiety. 5 tablet 0  . loperamide (IMODIUM A-D) 2 MG tablet Take 2 mg by mouth See admin instructions. Take one tablet (2 mg) by mouth on dialysis days (Tuesday, Thursday, Saturday)    . methylPREDNISolone (MEDROL DOSEPAK) 4 MG TBPK tablet Please follow directions on Dosepak. 21 tablet 0  . multivitamin (RENA-VIT) TABS tablet Take 1 tablet by mouth daily. (Patient taking differently: Take 1 tablet by mouth at bedtime. ) 30 tablet 0  . Nutritional Supplements (NOVASOURCE RENAL) LIQD Take 237 mLs by mouth See admin instructions. Take 1 container (237 mls) by mouth daily on dialysis days (Tuesday, Thursday, Saturday) - either during or after treatment    . oxyCODONE-acetaminophen (PERCOCET/ROXICET) 5-325 MG tablet Take 1 tablet by mouth every 4 (four) hours as needed  for severe pain. 15 tablet 0   No current facility-administered medications for this visit.     ROS:   General:  No weight loss, Fever, chills  HEENT: No recent headaches, no nasal bleeding, no visual changes, no sore throat  Neurologic: No dizziness, blackouts, seizures. No recent symptoms of stroke or mini- stroke. No recent episodes of slurred speech, or temporary blindness.  Cardiac: No recent episodes of chest pain/pressure, no shortness of breath at rest.  No shortness of breath with exertion.  Denies history of atrial fibrillation or irregular heartbeat  Vascular: No history of rest pain in feet.  No history of claudication.  No history of non-healing ulcer, No history of DVT   Pulmonary: No home oxygen, no productive cough, no hemoptysis,  No  asthma or wheezing  Musculoskeletal:  [ ]  Arthritis, [ ]  Low back pain,  [ ]  Joint pain  Hematologic:No history of hypercoagulable state.  No history of easy bleeding.  + history of anemia  Gastrointestinal: No hematochezia or melena,  No gastroesophageal reflux, no trouble swallowing  Urinary: [ ]  chronic Kidney disease, [x ] on HD - [ ]  MWF or [ ]  TTHS, [ ]  Burning with urination, [ ]  Frequent urination, [ ]  Difficulty urinating;   Skin: No rashes, no erythema, tender posterior upper arm knot supra triceps area.    Psychological: No history of anxiety,  No history of depression   Physical Examination  There were no vitals filed for this visit.  There is no height or weight on file to calculate BMI.  General:  Alert and oriented, no acute distress HEENT: Normal Neck: No bruit or JVD Pulmonary: Clear to auscultation bilaterally Cardiac: Regular Rate and Rhythm without murmur Gastrointestinal: Soft, non-tender, non-distended, no mass, no scars Skin: No rash Extremity Pulses:  2+ radial, brachial pulses bilaterally Musculoskeletal: No deformity or edema  Neurologic: Upper and lower extremity motor 5/5 and symmetric  DATA:   Duplex was performed today showing diameter and depth acceptable for HD   ASSESSMENT:  Possible infected AV fistula without synthetic components.   PLAN: She is afebrile, there is no active drainage or ulcer over the fistula.  She reports the fistula is working well.  I would contiue the IV antibiotics until culture are back.  If there is growth on culture treat with appropriate antibiotics.  F/U PRN.  Roxy Horseman PA-C [facility] Office: [phone]  MD in clinic [place]

## 2019-12-12 ENCOUNTER — Encounter (HOSPITAL_COMMUNITY): Payer: Self-pay

## 2019-12-12 ENCOUNTER — Other Ambulatory Visit: Payer: Self-pay

## 2019-12-12 ENCOUNTER — Emergency Department (HOSPITAL_COMMUNITY)
Admission: EM | Admit: 2019-12-12 | Discharge: 2019-12-12 | Disposition: A | Discharge status: Home / Self Care | Payer: Medicaid Other | Attending: Emergency Medicine | Admitting: Emergency Medicine

## 2019-12-12 DIAGNOSIS — T82838A Hemorrhage of vascular prosthetic devices, implants and grafts, initial encounter: Secondary | ICD-10-CM | POA: Diagnosis not present

## 2019-12-12 DIAGNOSIS — N186 End stage renal disease: Secondary | ICD-10-CM | POA: Insufficient documentation

## 2019-12-12 DIAGNOSIS — Z992 Dependence on renal dialysis: Secondary | ICD-10-CM | POA: Insufficient documentation

## 2019-12-12 DIAGNOSIS — F17210 Nicotine dependence, cigarettes, uncomplicated: Secondary | ICD-10-CM | POA: Diagnosis not present

## 2019-12-12 DIAGNOSIS — M3214 Glomerular disease in systemic lupus erythematosus: Secondary | ICD-10-CM | POA: Diagnosis not present

## 2019-12-12 DIAGNOSIS — Z79899 Other long term (current) drug therapy: Secondary | ICD-10-CM | POA: Diagnosis not present

## 2019-12-12 DIAGNOSIS — Y712 Prosthetic and other implants, materials and accessory cardiovascular devices associated with adverse incidents: Secondary | ICD-10-CM | POA: Insufficient documentation

## 2019-12-12 DIAGNOSIS — I132 Hypertensive heart and chronic kidney disease with heart failure and with stage 5 chronic kidney disease, or end stage renal disease: Secondary | ICD-10-CM | POA: Diagnosis not present

## 2019-12-12 DIAGNOSIS — I5022 Chronic systolic (congestive) heart failure: Secondary | ICD-10-CM | POA: Insufficient documentation

## 2019-12-12 NOTE — ED Provider Notes (Addendum)
[facility] EMERGENCY DEPARTMENT Provider Note   CSN: [id number] Arrival date & time: 12/12/19  [id number]     History No chief complaint on file.   [name] is a 38 y.o. female.  Chief complaint bleeding from dialysis fistula morning.  Patient reports unanticipated bleeding from the fistula of the distal left humerus this morning.  She applied pressure and wrapped it tightly with a bandage.  Bleeding has now improved.  No other complaints at this time.        Past Medical History:  Diagnosis Date  . Anemia    low iron - receives iron at dialysis  . Chronic systolic congestive heart failure ([place]) 03/16/2016  . Dyspnea   . ESRD (end stage renal disease) ([place])    Hemo TTHSAT _ [place]  . H/O pericarditis 01/17/2013  . H/O pleural effusion 01/17/2013  . Heart murmur   . Lupus (systemic lupus erythematosus) (HCC)    Previously followed with Dr. Charlestine Night, has not followed up recently  . 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 Pneumonia    many times  . Polysubstance abuse ([place])    cocaine, MJ, tobacco  . S/P pericardiocentesis 01/17/2013   H/o pericardial effusion with tamponade 2006   . Seizures ([place])    during pregnancy 1 time  . Streptococcal bacteremia 01/23/2013   She had two S. pneumonae bacteremia on 01/21/2013. Sensitive to Peniccilin     Patient Active Problem List   Diagnosis Date Noted  . Hyperkalemia 07/29/2018  . SVT (supraventricular tachycardia) ([place]) 07/16/2018  . ESRD (end stage renal disease) on dialysis ([place]) 02/17/2017  . Chronic systolic congestive heart failure ([place]) 03/16/2016  . Other hypervolemia   . S/P thoracentesis   . Cough with hemoptysis   . Myalgia   . Pulmonary edema   . Rash and nonspecific skin eruption 06/26/2015  . Secondary Raynaud's phenomenon 06/24/2015   . Cramping of hands 04/19/2014  . Unspecified contraceptive management 04/19/2014  . End stage renal disease ([place]) 03/27/2014  . Insomnia 03/15/2014  . Benign hypertension with ESRD (end-stage renal disease) ([place]) 03/15/2014  . Tobacco abuse 02/15/2014  . Healthcare maintenance 02/15/2014  . Hypoalbuminemia 02/01/2014  . Nephrotic syndrome 02/01/2014  . ESRD on dialysis ([place]) 01/31/2014  . Pleural effusion, left 01/31/2014  . Microcytic anemia 01/29/2014  . Hypocalcemia 01/29/2014  . Cocaine abuse ([place]) 01/18/2013  . Marijuana smoker ([place]) 01/18/2013  . H/O pericarditis 01/17/2013  . SLE (systemic lupus erythematosus) ([place]) 01/17/2013  . Lupus nephritis ([place]) 01/17/2013  . S/P pericardiocentesis 01/17/2013  . H/O pleural effusion 01/17/2013  . Nephrosis 01/17/2013  . Preseptal cellulitis 01/17/2013    Past Surgical History:  Procedure Laterality Date  . AV FISTULA PLACEMENT    . BASCILIC VEIN TRANSPOSITION Left 02/05/2014   Procedure: [place];  Surgeon: [name], MD;  Location: [facility];  Service: Vascular;  Laterality: Left;  . FISTULA SUPERFICIALIZATION Left 05/30/2018   Procedure: FISTULA PLICATION BASILIC VEIN TRANSPOSITION;  Surgeon: Angelia Mould, MD;  Location: [facility];  Service: Vascular;  Laterality: Left;  Marland Kitchen VENOGRAM Right 01/31/2014   Procedure: DIALYSIS CATHETER;  Surgeon: Serafina Mitchell, MD;  Location: [facility] CATH LAB;  Service: Cardiovascular;  Laterality: Right;     OB History   No obstetric history on file.     No family history  on file.  Social History   Tobacco Use  . Smoking status: Current Every Day Smoker    Packs/day: 0.12    Years: 15.00    Pack years: 1.80    Types: Cigarettes  . Smokeless tobacco: Never Used  . Tobacco comment: quit smoking s/p hospital discharge/ SMOKES ABOUT 2-3 CIGARETTES A DAY  Substance Use Topics  . Alcohol use: Yes    Alcohol/week: 0.0 standard drinks    Comment: Special Occasional takes Vicar   . Drug use: Yes    Frequency: 5.0 times per week    Types: Marijuana, Cocaine    Comment: Marijuana last time mid May 2019, Cocaine- last tile mid April 2019    Home Medications Prior to Admission medications   Medication Sig Start Date End Date Taking? Authorizing Provider  albuterol (PROVENTIL HFA;VENTOLIN HFA) 108 (90 Base) MCG/ACT inhaler Inhale 1-2 puffs into the lungs every 6 (six) hours as needed for wheezing or shortness of breath. Patient not taking: Reported on 12/29/2018 08/18/18   Clent Demark, PA-C  calcium acetate (PHOSLO) 667 MG capsule Take 2,001-4,002 mg by mouth See admin instructions. Take 6 capsule (4,002 mg) by mouth three times a day with each meal and 3 capsules (2,001 mg) with each snack    [provider]  carvedilol (COREG) 6.25 MG tablet Take 1 tablet (6.25 mg total) by mouth 2 (two) times daily with a meal. 08/18/18   Clent Demark, PA-C  cinacalcet (SENSIPAR) 30 MG tablet Take 1 tablet (30 mg total) by mouth daily with supper. 07/19/18   Alphonzo Grieve, MD  darbepoetin (ARANESP) 60 MCG/0.3ML SOLN injection Inject 0.3 mLs (60 mcg total) into the vein every Wednesday with hemodialysis. Patient taking differently: Inject 60 mcg into the vein every Saturday.  02/08/14   Jessee Avers, MD  diltiazem (CARDIZEM) 30 MG tablet Take 1 tablet (30 mg total) by mouth every 6 (six) hours as needed (SVT). Patient taking differently: Take 30 mg by mouth as needed (before dialysis).  07/19/18   Alphonzo Grieve, MD  diphenhydrAMINE (BENADRYL) 25 MG tablet Take 25-50 mg by mouth See admin instructions. Take one tablet (25 mg) by mouth twice daily on Sunday, Monday, Wednesday, Friday, take two tablets (50 mg) once daily at dialysis on Tuesday, Thursday, Saturday.    [provider]  ferric gluconate 62.5 mg in sodium chloride 0.9 % 100 mL Inject 62.5 mg into the vein every Thursday with hemodialysis. 03/09/16   Rice, Resa Miner, MD  hydroxychloroquine  (PLAQUENIL) 200 MG tablet Take 1 tablet (200 mg total) by mouth daily. 07/19/18   Alphonzo Grieve, MD  hydrOXYzine (ATARAX/VISTARIL) 10 MG tablet Take 1 tablet (10 mg total) by mouth 3 (three) times daily as needed for anxiety. 07/29/19   Caccavale, Sophia, PA-C  loperamide (IMODIUM A-D) 2 MG tablet Take 2 mg by mouth See admin instructions. Take one tablet (2 mg) by mouth on dialysis days (Tuesday, Thursday, Saturday)    [provider]  multivitamin (RENA-VIT) TABS tablet Take 1 tablet by mouth daily. Patient taking differently: Take 1 tablet by mouth at bedtime.  02/18/17   Holley Raring, MD  Nutritional Supplements (NOVASOURCE RENAL) LIQD Take 237 mLs by mouth See admin instructions. Take 1 container (237 mls) by mouth daily on dialysis days (Tuesday, Thursday, Saturday) - either during or after treatment    [provider]    Allergies    Tobramycin sulfate  Review of Systems   Review of Systems  All  other systems reviewed and are negative.   Physical Exam Updated Vital Signs BP (!) 157/106 (BP Location: Right Arm)   Pulse (!) 107   Temp 98.4 F (36.9 C) (Oral)   Resp 16   SpO2 100%   Physical Exam Vitals and nursing note reviewed.  Constitutional:      Appearance: She is well-developed.     Comments: nad  HENT:     Head: Normocephalic and atraumatic.  Eyes:     Conjunctiva/sclera: Conjunctivae normal.  Cardiovascular:     Rate and Rhythm: Normal rate and regular rhythm.  Pulmonary:     Effort: Pulmonary effort is normal.     Breath sounds: Normal breath sounds.  Abdominal:     General: Bowel sounds are normal.     Palpations: Abdomen is soft.  Musculoskeletal:        General: Normal range of motion.     Cervical back: Neck supple.  Skin:    Comments: Left anterior distal humerus: Significant pressure dressing has been applied.  Good hemostasis.  Neurological:     General: No focal deficit present.     Mental Status: She is alert and oriented to  person, place, and time.  Psychiatric:        Behavior: Behavior normal.     ED Results / Procedures / Treatments   Labs (all labs ordered are listed, but only abnormal results are displayed) Labs Reviewed - No data to display  EKG None  Radiology No results found.  Procedures Procedures (including critical care time)  Medications Ordered in ED Medications - No data to display  ED Course  I have reviewed the triage vital signs and the nursing notes.  Pertinent labs & imaging results that were available during my care of the patient were reviewed by me and considered in my medical decision making (see chart for details).    MDM Rules/Calculators/A&P                      Patient will be observed in the emergency department to ensure the integrity of her bleeding fistula.  1450: Patient rechecked several times.  Good hemostasis.  Stable for discharge. Final Clinical Impression(s) / ED Diagnoses Final diagnoses:  Bleeding due to dialysis catheter placement, initial encounter [facility])    Rx / [place] Orders ED Discharge Orders    None       Nat Christen, MD 12/12/19 1249    Nat Christen, MD 12/12/19 1454

## 2019-12-12 NOTE — ED Triage Notes (Signed)
Patient complains of fistula bleeding this am while in shower as she got ready for dialysis. No bleeding on arrival, patient reports that she held pressure for 1 hour

## 2019-12-12 NOTE — ED Notes (Signed)
Pt refuses discharged vital signs taken, states " I have already been discharged".

## 2019-12-12 NOTE — Discharge Instructions (Signed)
Keep arm in elevated position.  If bleeding returns, reinforce the dressing.  Can also apply ice.  Follow-up with your routine dialysis.

## 2019-12-14 DIAGNOSIS — A049 Bacterial intestinal infection, unspecified: Secondary | ICD-10-CM | POA: Insufficient documentation

## 2019-12-16 ENCOUNTER — Other Ambulatory Visit: Payer: Self-pay

## 2019-12-16 ENCOUNTER — Emergency Department (HOSPITAL_COMMUNITY)
Admission: EM | Admit: 2019-12-16 | Discharge: 2019-12-16 | Disposition: A | Discharge status: Home / Self Care | Payer: Medicaid Other | Attending: Emergency Medicine | Admitting: Emergency Medicine

## 2019-12-16 ENCOUNTER — Encounter (HOSPITAL_COMMUNITY): Payer: Self-pay | Admitting: Emergency Medicine

## 2019-12-16 DIAGNOSIS — T829XXA Unspecified complication of cardiac and vascular prosthetic device, implant and graft, initial encounter: Secondary | ICD-10-CM

## 2019-12-16 DIAGNOSIS — I132 Hypertensive heart and chronic kidney disease with heart failure and with stage 5 chronic kidney disease, or end stage renal disease: Secondary | ICD-10-CM | POA: Diagnosis not present

## 2019-12-16 DIAGNOSIS — Z79899 Other long term (current) drug therapy: Secondary | ICD-10-CM | POA: Diagnosis not present

## 2019-12-16 DIAGNOSIS — F17210 Nicotine dependence, cigarettes, uncomplicated: Secondary | ICD-10-CM | POA: Insufficient documentation

## 2019-12-16 DIAGNOSIS — N186 End stage renal disease: Secondary | ICD-10-CM

## 2019-12-16 DIAGNOSIS — I5022 Chronic systolic (congestive) heart failure: Secondary | ICD-10-CM | POA: Diagnosis not present

## 2019-12-16 DIAGNOSIS — Y828 Other medical devices associated with adverse incidents: Secondary | ICD-10-CM | POA: Insufficient documentation

## 2019-12-16 DIAGNOSIS — Z992 Dependence on renal dialysis: Secondary | ICD-10-CM | POA: Diagnosis not present

## 2019-12-16 NOTE — Discharge Instructions (Signed)
Recommend outpatient follow-up with vascular surgery.  Will place a nonadherent dressing the current gauze dressing pulls the scab off.  Leave the new dressing that we placed today on for 2 days.  Continue dialysis as your normal schedule.  Return for any new or worse symptoms.

## 2019-12-16 NOTE — ED Triage Notes (Signed)
Patient states she was seen here a few days ago for same complaint. States she had a spot on her fistula that was scabbed over that came lose and would not stop bleeding. Had full dialysis treatment today PTA and was told to follow up here. No bleeding at this time.

## 2019-12-16 NOTE — ED Provider Notes (Signed)
[facility] EMERGENCY DEPARTMENT Provider Note   CSN: [id number] Arrival date & time: 12/16/19  1358     History Chief Complaint  Patient presents with  . Vascular Access Problem    [name] is a 38 y.o. female.  Patient referred in after dialysis today.  Patient normally dialyzed on Tuesday Thursdays and Saturdays did have complete dialysis today.  She been having bleeding problems from the lower aspect of her left arm AV fistula.  Dialysis was able to use the upper part today for dialysis.  Patient's had a dressing in place.  When she says when she takes it off it pulls the scab off and it tends to bleed again.  Just has paper tape in place no massive amount of bleeding or any evidence of bleeding at all at this point time.  Patient followed by vascular surgery here locally.        Past Medical History:  Diagnosis Date  . Anemia    low iron - receives iron at dialysis  . Chronic systolic congestive heart failure ([place]) 03/16/2016  . Dyspnea   . ESRD (end stage renal disease) ([place])    Hemo TTHSAT _ [place]  . H/O pericarditis 01/17/2013  . H/O pleural effusion 01/17/2013  . Heart murmur   . Lupus (systemic lupus erythematosus) (HCC)    Previously followed with Dr. Charlestine Night, has not followed up recently  . 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 Pneumonia    many times  . Polysubstance abuse ([place])    cocaine, MJ, tobacco  . S/P pericardiocentesis 01/17/2013   H/o pericardial effusion with tamponade 2006   . Seizures ([place])    during pregnancy 1 time  . Streptococcal bacteremia 01/23/2013   She had two S. pneumonae bacteremia on 01/21/2013. Sensitive to Peniccilin     Patient Active Problem List   Diagnosis Date Noted  . Hyperkalemia 07/29/2018  . SVT (supraventricular tachycardia) ([place])  07/16/2018  . ESRD (end stage renal disease) on dialysis ([place]) 02/17/2017  . Chronic systolic congestive heart failure ([place]) 03/16/2016  . Other hypervolemia   . S/P thoracentesis   . Cough with hemoptysis   . Myalgia   . Pulmonary edema   . Rash and nonspecific skin eruption 06/26/2015  . Secondary Raynaud's phenomenon 06/24/2015  . Cramping of hands 04/19/2014  . Unspecified contraceptive management 04/19/2014  . End stage renal disease ([place]) 03/27/2014  . Insomnia 03/15/2014  . Benign hypertension with ESRD (end-stage renal disease) ([place]) 03/15/2014  . Tobacco abuse 02/15/2014  . Healthcare maintenance 02/15/2014  . Hypoalbuminemia 02/01/2014  . Nephrotic syndrome 02/01/2014  . ESRD on dialysis ([place]) 01/31/2014  . Pleural effusion, left 01/31/2014  . Microcytic anemia 01/29/2014  . Hypocalcemia 01/29/2014  . Cocaine abuse ([place]) 01/18/2013  . Marijuana smoker ([place]) 01/18/2013  . H/O pericarditis 01/17/2013  . SLE (systemic lupus erythematosus) ([place]) 01/17/2013  . Lupus nephritis ([place]) 01/17/2013  . S/P pericardiocentesis 01/17/2013  . H/O pleural effusion 01/17/2013  . Nephrosis 01/17/2013  . Preseptal cellulitis 01/17/2013    Past Surgical History:  Procedure Laterality Date  . AV FISTULA PLACEMENT    . BASCILIC VEIN TRANSPOSITION Left 02/05/2014   Procedure: [place];  Surgeon: [name], MD;  Location: [facility];  Service: Vascular;  Laterality: Left;  . FISTULA SUPERFICIALIZATION Left 05/30/2018   Procedure:  FISTULA PLICATION BASILIC VEIN TRANSPOSITION;  Surgeon: Angelia Mould, MD;  Location: [facility];  Service: Vascular;  Laterality: Left;  Marland Kitchen VENOGRAM Right 01/31/2014   Procedure: DIALYSIS CATHETER;  Surgeon: Serafina Mitchell, MD;  Location: [facility] CATH LAB;  Service: Cardiovascular;  Laterality: Right;     OB History   No obstetric history on file.     No family history on file.  Social History   Tobacco Use  . Smoking status: Current Every  Day Smoker    Packs/day: 0.12    Years: 15.00    Pack years: 1.80    Types: Cigarettes  . Smokeless tobacco: Never Used  . Tobacco comment: quit smoking s/p hospital discharge/ SMOKES ABOUT 2-3 CIGARETTES A DAY  Substance Use Topics  . Alcohol use: Yes    Alcohol/week: 0.0 standard drinks    Comment: Special Occasional takes Vicar  . Drug use: Yes    Frequency: 5.0 times per week    Types: Marijuana, Cocaine    Comment: Marijuana last time mid May 2019, Cocaine- last tile mid April 2019    Home Medications Prior to Admission medications   Medication Sig Start Date End Date Taking? Authorizing Provider  albuterol (PROVENTIL HFA;VENTOLIN HFA) 108 (90 Base) MCG/ACT inhaler Inhale 1-2 puffs into the lungs every 6 (six) hours as needed for wheezing or shortness of breath. Patient not taking: Reported on 12/29/2018 08/18/18   Clent Demark, PA-C  calcium acetate (PHOSLO) 667 MG capsule Take 2,001-4,002 mg by mouth See admin instructions. Take 6 capsule (4,002 mg) by mouth three times a day with each meal and 3 capsules (2,001 mg) with each snack    [provider]  carvedilol (COREG) 6.25 MG tablet Take 1 tablet (6.25 mg total) by mouth 2 (two) times daily with a meal. 08/18/18   Clent Demark, PA-C  cinacalcet (SENSIPAR) 30 MG tablet Take 1 tablet (30 mg total) by mouth daily with supper. 07/19/18   Alphonzo Grieve, MD  darbepoetin (ARANESP) 60 MCG/0.3ML SOLN injection Inject 0.3 mLs (60 mcg total) into the vein every Wednesday with hemodialysis. Patient taking differently: Inject 60 mcg into the vein every Saturday.  02/08/14   Jessee Avers, MD  diltiazem (CARDIZEM) 30 MG tablet Take 1 tablet (30 mg total) by mouth every 6 (six) hours as needed (SVT). Patient taking differently: Take 30 mg by mouth as needed (before dialysis).  07/19/18   Alphonzo Grieve, MD  diphenhydrAMINE (BENADRYL) 25 MG tablet Take 25-50 mg by mouth See admin instructions. Take one tablet (25 mg) by  mouth twice daily on Sunday, Monday, Wednesday, Friday, take two tablets (50 mg) once daily at dialysis on Tuesday, Thursday, Saturday.    [provider]  ferric gluconate 62.5 mg in sodium chloride 0.9 % 100 mL Inject 62.5 mg into the vein every Thursday with hemodialysis. 03/09/16   Rice, Resa Miner, MD  hydroxychloroquine (PLAQUENIL) 200 MG tablet Take 1 tablet (200 mg total) by mouth daily. 07/19/18   Alphonzo Grieve, MD  hydrOXYzine (ATARAX/VISTARIL) 10 MG tablet Take 1 tablet (10 mg total) by mouth 3 (three) times daily as needed for anxiety. 07/29/19   Caccavale, Sophia, PA-C  loperamide (IMODIUM A-D) 2 MG tablet Take 2 mg by mouth See admin instructions. Take one tablet (2 mg) by mouth on dialysis days (Tuesday, Thursday, Saturday)    [provider]  multivitamin (RENA-VIT) TABS tablet Take 1 tablet by mouth daily. Patient taking differently: Take 1 tablet by mouth at  bedtime.  02/18/17   Holley Raring, MD  Nutritional Supplements (NOVASOURCE RENAL) LIQD Take 237 mLs by mouth See admin instructions. Take 1 container (237 mls) by mouth daily on dialysis days (Tuesday, Thursday, Saturday) - either during or after treatment    [provider]    Allergies    Tobramycin sulfate  Review of Systems   Review of Systems  Constitutional: Negative for chills and fever.  HENT: Negative for rhinorrhea and sore throat.   Eyes: Negative for visual disturbance.  Respiratory: Negative for cough and shortness of breath.   Cardiovascular: Negative for chest pain and leg swelling.  Gastrointestinal: Negative for abdominal pain, diarrhea, nausea and vomiting.  Genitourinary: Negative for dysuria.  Musculoskeletal: Negative for back pain and neck pain.  Skin: Positive for wound. Negative for rash.  Neurological: Negative for dizziness, light-headedness and headaches.  Hematological: Does not bruise/bleed easily.  Psychiatric/Behavioral: Negative for confusion.    Physical  Exam Updated Vital Signs BP 120/74   Pulse 90   Temp 98.9 F (37.2 C) (Oral)   Resp 16   SpO2 99%   Physical Exam Vitals and nursing note reviewed.  Constitutional:      General: She is not in acute distress.    Appearance: Normal appearance. She is well-developed.  HENT:     Head: Normocephalic and atraumatic.  Eyes:     Extraocular Movements: Extraocular movements intact.     Conjunctiva/sclera: Conjunctivae normal.     Pupils: Pupils are equal, round, and reactive to light.  Cardiovascular:     Rate and Rhythm: Normal rate and regular rhythm.     Heart sounds: No murmur.  Pulmonary:     Effort: Pulmonary effort is normal. No respiratory distress.     Breath sounds: Normal breath sounds.  Abdominal:     Palpations: Abdomen is soft.     Tenderness: There is no abdominal tenderness.  Musculoskeletal:     Cervical back: Normal range of motion and neck supple.     Comments: Left arm AV fistula with good thrill.  Lower dressing is the one that is kind of a here to her scab there.  Because the gauze is present right over the scab.  We will replace with a nonadherent dressing.  Good cap refill distally.  Skin:    General: Skin is warm and dry.  Neurological:     Mental Status: She is alert.     ED Results / Procedures / Treatments   Labs (all labs ordered are listed, but only abnormal results are displayed) Labs Reviewed - No data to display  EKG None  Radiology No results found.  Procedures Procedures (including critical care time)  Medications Ordered in ED Medications - No data to display  ED Course  I have reviewed the triage vital signs and the nursing notes.  Pertinent labs & imaging results that were available during my care of the patient were reviewed by me and considered in my medical decision making (see chart for details).    MDM Rules/Calculators/A&P                       We will replace the dressing with a nonadherent dressing and wrap it for  2 days.  Patient can follow-up with vascular surgery on outpatient basis.  Fistula is functioning fine.  They are able to use the upper portion for dialysis.  Patient to continue her dialysis schedule.    Final Clinical Impression(s) /  ED Diagnoses Final diagnoses:  ESRD (end stage renal disease) on dialysis [facility])  Complication of vascular access for dialysis, initial encounter    Rx / DC Orders ED Discharge Orders    None       Fredia Sorrow, MD 12/16/19 1451

## 2019-12-18 ENCOUNTER — Ambulatory Visit (INDEPENDENT_AMBULATORY_CARE_PROVIDER_SITE_OTHER): Payer: Medicaid Other | Admitting: Family

## 2019-12-18 ENCOUNTER — Ambulatory Visit (HOSPITAL_COMMUNITY)
Admission: RE | Admit: 2019-12-18 | Discharge: 2019-12-18 | Disposition: A | Discharge status: Home / Self Care | Payer: Medicaid Other | Source: Ambulatory Visit | Attending: Vascular Surgery | Admitting: Vascular Surgery

## 2019-12-18 ENCOUNTER — Ambulatory Visit (HOSPITAL_COMMUNITY): Payer: Medicaid Other | Admitting: Anesthesiology

## 2019-12-18 ENCOUNTER — Other Ambulatory Visit: Payer: Self-pay

## 2019-12-18 ENCOUNTER — Encounter: Payer: Self-pay | Admitting: Family

## 2019-12-18 ENCOUNTER — Encounter (HOSPITAL_COMMUNITY): Payer: Self-pay | Admitting: Vascular Surgery

## 2019-12-18 ENCOUNTER — Encounter (HOSPITAL_COMMUNITY)
Admission: RE | Disposition: A | Discharge status: Home / Self Care | Payer: Self-pay | Source: Ambulatory Visit | Attending: Vascular Surgery

## 2019-12-18 VITALS — BP 133/88 | HR 102 | Temp 98.6°F | Resp 16 | Ht 64.0 in | Wt 117.0 lb

## 2019-12-18 DIAGNOSIS — I5022 Chronic systolic (congestive) heart failure: Secondary | ICD-10-CM | POA: Insufficient documentation

## 2019-12-18 DIAGNOSIS — N186 End stage renal disease: Secondary | ICD-10-CM | POA: Insufficient documentation

## 2019-12-18 DIAGNOSIS — M3214 Glomerular disease in systemic lupus erythematosus: Secondary | ICD-10-CM | POA: Diagnosis not present

## 2019-12-18 DIAGNOSIS — T82898A Other specified complication of vascular prosthetic devices, implants and grafts, initial encounter: Secondary | ICD-10-CM | POA: Diagnosis not present

## 2019-12-18 DIAGNOSIS — T82590A Other mechanical complication of surgically created arteriovenous fistula, initial encounter: Secondary | ICD-10-CM | POA: Diagnosis not present

## 2019-12-18 DIAGNOSIS — T82510A Breakdown (mechanical) of surgically created arteriovenous fistula, initial encounter: Secondary | ICD-10-CM | POA: Insufficient documentation

## 2019-12-18 DIAGNOSIS — Z992 Dependence on renal dialysis: Secondary | ICD-10-CM | POA: Diagnosis not present

## 2019-12-18 DIAGNOSIS — D631 Anemia in chronic kidney disease: Secondary | ICD-10-CM | POA: Insufficient documentation

## 2019-12-18 DIAGNOSIS — Y841 Kidney dialysis as the cause of abnormal reaction of the patient, or of later complication, without mention of misadventure at the time of the procedure: Secondary | ICD-10-CM | POA: Diagnosis not present

## 2019-12-18 DIAGNOSIS — Z20828 Contact with and (suspected) exposure to other viral communicable diseases: Secondary | ICD-10-CM | POA: Insufficient documentation

## 2019-12-18 DIAGNOSIS — F17210 Nicotine dependence, cigarettes, uncomplicated: Secondary | ICD-10-CM | POA: Diagnosis not present

## 2019-12-18 DIAGNOSIS — Z79899 Other long term (current) drug therapy: Secondary | ICD-10-CM | POA: Insufficient documentation

## 2019-12-18 HISTORY — PX: FISTULA SUPERFICIALIZATION: SHX6341

## 2019-12-18 LAB — RESPIRATORY PANEL BY RT PCR (FLU A&B, COVID)
Influenza A by PCR: NEGATIVE
Influenza B by PCR: NEGATIVE
SARS Coronavirus 2 by RT PCR: NEGATIVE

## 2019-12-18 LAB — HCG, SERUM, QUALITATIVE: Preg, Serum: NEGATIVE

## 2019-12-18 LAB — POCT I-STAT, CHEM 8
BUN: 62 mg/dL — ABNORMAL HIGH (ref 6–20)
Calcium, Ion: 1.1 mmol/L — ABNORMAL LOW (ref 1.15–1.40)
Chloride: 95 mmol/L — ABNORMAL LOW (ref 98–111)
Creatinine, Ser: 11.8 mg/dL — ABNORMAL HIGH (ref 0.44–1.00)
Glucose, Bld: 70 mg/dL (ref 70–99)
HCT: 32 % — ABNORMAL LOW (ref 36.0–46.0)
Hemoglobin: 10.9 g/dL — ABNORMAL LOW (ref 12.0–15.0)
Potassium: 5.8 mmol/L — ABNORMAL HIGH (ref 3.5–5.1)
Sodium: 135 mmol/L (ref 135–145)
TCO2: 35 mmol/L — ABNORMAL HIGH (ref 22–32)

## 2019-12-18 SURGERY — FISTULA SUPERFICIALIZATION
Anesthesia: General | Site: Arm Upper | Laterality: Left

## 2019-12-18 MED ORDER — CHLORHEXIDINE GLUCONATE 4 % EX LIQD: 60.0000 mL | Freq: Once | CUTANEOUS | Status: DC

## 2019-12-18 MED ORDER — MIDAZOLAM HCL 2 MG/2ML IJ SOLN
INTRAMUSCULAR | Status: DC | PRN
  Administered 2019-12-18: 2 mg via INTRAVENOUS

## 2019-12-18 MED ORDER — EPHEDRINE SULFATE-NACL 50-0.9 MG/10ML-% IV SOSY
PREFILLED_SYRINGE | INTRAVENOUS | Status: DC | PRN
  Administered 2019-12-18: 10 mg via INTRAVENOUS

## 2019-12-18 MED ORDER — FENTANYL CITRATE (PF) 250 MCG/5ML IJ SOLN
INTRAMUSCULAR | Status: AC
  Filled 2019-12-18: qty 5

## 2019-12-18 MED ORDER — DEXAMETHASONE SODIUM PHOSPHATE 10 MG/ML IJ SOLN
INTRAMUSCULAR | Status: DC | PRN
  Administered 2019-12-18: 5 mg via INTRAVENOUS

## 2019-12-18 MED ORDER — SODIUM CHLORIDE 0.9 % IV SOLN
INTRAVENOUS | Status: DC | PRN
  Administered 2019-12-18: 500 mL

## 2019-12-18 MED ORDER — MIDAZOLAM HCL 2 MG/2ML IJ SOLN
INTRAMUSCULAR | Status: AC
  Filled 2019-12-18: qty 2

## 2019-12-18 MED ORDER — FENTANYL CITRATE (PF) 250 MCG/5ML IJ SOLN
INTRAMUSCULAR | Status: DC | PRN
  Administered 2019-12-18 (×2): 50 ug via INTRAVENOUS
  Administered 2019-12-18: 100 ug via INTRAVENOUS

## 2019-12-18 MED ORDER — CARVEDILOL 12.5 MG PO TABS
ORAL_TABLET | ORAL | Status: AC
  Administered 2019-12-18: 12.5 mg
  Filled 2019-12-18: qty 1

## 2019-12-18 MED ORDER — LIDOCAINE 2% (20 MG/ML) 5 ML SYRINGE
INTRAMUSCULAR | Status: AC
  Filled 2019-12-18: qty 5

## 2019-12-18 MED ORDER — 0.9 % SODIUM CHLORIDE (POUR BTL) OPTIME
TOPICAL | Status: DC | PRN
  Administered 2019-12-18: 1000 mL

## 2019-12-18 MED ORDER — PROPOFOL 10 MG/ML IV BOLUS
INTRAVENOUS | Status: DC | PRN
  Administered 2019-12-18: 150 mg via INTRAVENOUS

## 2019-12-18 MED ORDER — OXYCODONE-ACETAMINOPHEN 5-325 MG PO TABS: 1.0000 | ORAL_TABLET | Freq: Four times a day (QID) | ORAL | 0 refills | Status: DC | PRN

## 2019-12-18 MED ORDER — EPHEDRINE 5 MG/ML INJ
INTRAVENOUS | Status: AC
  Filled 2019-12-18: qty 10

## 2019-12-18 MED ORDER — CEFAZOLIN SODIUM-DEXTROSE 2-4 GM/100ML-% IV SOLN
2.0000 g | INTRAVENOUS | Status: AC
  Administered 2019-12-18: 2 g via INTRAVENOUS
  Filled 2019-12-18: qty 100

## 2019-12-18 MED ORDER — ONDANSETRON HCL 4 MG/2ML IJ SOLN
INTRAMUSCULAR | Status: AC
  Filled 2019-12-18: qty 6

## 2019-12-18 MED ORDER — SODIUM CHLORIDE 0.9 % IV SOLN: INTRAVENOUS | Status: DC

## 2019-12-18 MED ORDER — PROMETHAZINE HCL 25 MG/ML IJ SOLN: 6.2500 mg | INTRAMUSCULAR | Status: DC | PRN

## 2019-12-18 MED ORDER — FENTANYL CITRATE (PF) 100 MCG/2ML IJ SOLN: 25.0000 ug | INTRAMUSCULAR | Status: DC | PRN

## 2019-12-18 MED ORDER — ONDANSETRON HCL 4 MG/2ML IJ SOLN
INTRAMUSCULAR | Status: DC | PRN
  Administered 2019-12-18: 4 mg via INTRAVENOUS

## 2019-12-18 MED ORDER — DEXAMETHASONE SODIUM PHOSPHATE 10 MG/ML IJ SOLN
INTRAMUSCULAR | Status: AC
  Filled 2019-12-18: qty 3

## 2019-12-18 MED ORDER — ALBUMIN HUMAN 5 % IV SOLN: INTRAVENOUS | Status: DC | PRN

## 2019-12-18 MED ORDER — PHENYLEPHRINE HCL-NACL 10-0.9 MG/250ML-% IV SOLN
INTRAVENOUS | Status: DC | PRN
  Administered 2019-12-18: 20 ug/min via INTRAVENOUS

## 2019-12-18 MED ORDER — LIDOCAINE 2% (20 MG/ML) 5 ML SYRINGE
INTRAMUSCULAR | Status: DC | PRN
  Administered 2019-12-18: 100 mg via INTRAVENOUS

## 2019-12-18 SURGICAL SUPPLY — 34 items
ADH SKN CLS APL DERMABOND .7 (GAUZE/BANDAGES/DRESSINGS) ×1
ARMBAND PINK RESTRICT EXTREMIT (MISCELLANEOUS) ×3 IMPLANT
BNDG COHESIVE 4X5 TAN STRL (GAUZE/BANDAGES/DRESSINGS) ×2 IMPLANT
BNDG GAUZE ELAST 4 BULKY (GAUZE/BANDAGES/DRESSINGS) ×2 IMPLANT
CANISTER SUCT 3000ML PPV (MISCELLANEOUS) ×3 IMPLANT
CANNULA VESSEL 3MM 2 BLNT TIP (CANNULA) ×2 IMPLANT
CLIP LIGATING EXTRA MED SLVR (CLIP) ×3 IMPLANT
CLIP LIGATING EXTRA SM BLUE (MISCELLANEOUS) ×3 IMPLANT
COVER PROBE W GEL 5X96 (DRAPES) ×3 IMPLANT
COVER WAND RF STERILE (DRAPES) ×3 IMPLANT
CUFF TOURN SGL QUICK 18X4 (TOURNIQUET CUFF) ×2 IMPLANT
DECANTER SPIKE VIAL GLASS SM (MISCELLANEOUS) ×3 IMPLANT
DERMABOND ADVANCED (GAUZE/BANDAGES/DRESSINGS) ×2
DERMABOND ADVANCED .7 DNX12 (GAUZE/BANDAGES/DRESSINGS) ×1 IMPLANT
DRSG COVADERM 4X6 (GAUZE/BANDAGES/DRESSINGS) ×2 IMPLANT
ELECT REM PT RETURN 9FT ADLT (ELECTROSURGICAL) ×3
ELECTRODE REM PT RTRN 9FT ADLT (ELECTROSURGICAL) ×1 IMPLANT
GLOVE SS BIOGEL STRL SZ 7.5 (GLOVE) ×1 IMPLANT
GLOVE SUPERSENSE BIOGEL SZ 7.5 (GLOVE) ×2
GOWN STRL REUS W/ TWL LRG LVL3 (GOWN DISPOSABLE) ×3 IMPLANT
GOWN STRL REUS W/TWL LRG LVL3 (GOWN DISPOSABLE) ×9
KIT BASIN OR (CUSTOM PROCEDURE TRAY) ×3 IMPLANT
KIT TURNOVER KIT B (KITS) ×3 IMPLANT
NS IRRIG 1000ML POUR BTL (IV SOLUTION) ×3 IMPLANT
PACK CV ACCESS (CUSTOM PROCEDURE TRAY) ×3 IMPLANT
PAD ARMBOARD 7.5X6 YLW CONV (MISCELLANEOUS) ×6 IMPLANT
SUT ETHILON 3 0 PS 1 (SUTURE) ×2 IMPLANT
SUT PROLENE 5 0 C 1 24 (SUTURE) ×4 IMPLANT
SUT PROLENE 6 0 CC (SUTURE) ×3 IMPLANT
SUT VIC AB 3-0 SH 27 (SUTURE) ×3
SUT VIC AB 3-0 SH 27X BRD (SUTURE) ×1 IMPLANT
TOWEL GREEN STERILE (TOWEL DISPOSABLE) ×3 IMPLANT
UNDERPAD 30X30 (UNDERPADS AND DIAPERS) ×3 IMPLANT
WATER STERILE IRR 1000ML POUR (IV SOLUTION) ×3 IMPLANT

## 2019-12-18 NOTE — Progress Notes (Signed)
Dr. Gifford Shave notified patient's istat results, K+ 5.8

## 2019-12-18 NOTE — Progress Notes (Signed)
Patient last ate chips and a couple of bites of [place] sandwich and had soda to drink at 1130am prior to arrival.  Dr. Gifford Shave notified.  Patient will not be able to have surgery before 1730 per anesthesia.  Dr. Donnetta Hutching and OR desk made aware.

## 2019-12-18 NOTE — Progress Notes (Signed)
Established Dialysis Access  History of Present Illness  [name] is a 38 y.o. (19-Jul-1981) female who is s/p plication of left upper arm fistula on 05-30-18 by Dr. Scot Dock for  aneurysmal left upper arm AV fistula.   Pt was last evaluated on 08-16-19 by [facility]. At that time the impression was possible infected AV fistula without synthetic components. Pt was afebrile, there was no active drainage or ulcer over the fistula.  Pt reported the fistula was working well. Gwenette Greet advised contiue the IV antibiotics until culture results in.  If there is growth on culture treat with appropriate antibiotics. F/U PRN.  Pt returns today with c/o 2 episodes of bleeding from her AVF at home, started bleeding while in her shower 6 days ago, once in her [place]. She was seen in [facility] ED twice this: 12-15-and 12-16-19.  She dialyzes TTS via left upper arm AVF at [facility].   She is right hand dominant.  This is her first permanent access.   She denies any steal type symptoms in her left hand.   She has been nauseated and vomiting starting today. She denies loss of sense of smell, denies cough.  She is being given antibiotic in HD, she states she thinks this has nauseated her.    She had breakfast about 7:30 this morning. She ate some chips about about 10 am.    Past Medical History:  Diagnosis Date  . Anemia    low iron - receives iron at dialysis  . Chronic systolic congestive heart failure ([place]) 03/16/2016  . Dyspnea   . ESRD (end stage renal disease) ([place])    [facility] TTHSAT _ [place]  . H/O pericarditis 01/17/2013  . H/O pleural effusion 01/17/2013  . Heart murmur   . Lupus (systemic lupus erythematosus) (HCC)    Previously followed with Dr. Charlestine Night, has not followed up recently  . 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  Pneumonia    many times  . Polysubstance abuse ([place])    cocaine, MJ, tobacco  . S/P pericardiocentesis 01/17/2013   H/o pericardial effusion with tamponade 2006   . Seizures ([place])    during pregnancy 1 time  . Streptococcal bacteremia 01/23/2013   She had two S. pneumonae bacteremia on 01/21/2013. Sensitive to Peniccilin     Social History Social History   Tobacco Use  . Smoking status: Current Every Day Smoker    Packs/day: 0.12    Years: 15.00    Pack years: 1.80    Types: Cigarettes  . Smokeless tobacco: Never Used  . Tobacco comment: 4 cigarettes per day  Substance Use Topics  . Alcohol use: Yes    Alcohol/week: 0.0 standard drinks    Comment: Special Occasional takes Vicar  . Drug use: Yes    Frequency: 5.0 times per week    Types: Marijuana, Cocaine    Comment: Marijuana last time mid May 2019, Cocaine- last tile mid April 2019    Family History No family history on file.  Surgical History Past Surgical History:  Procedure Laterality Date  . AV FISTULA PLACEMENT    . BASCILIC VEIN TRANSPOSITION Left 02/05/2014   Procedure: [place];  Surgeon: [name], MD;  Location: [place];  Service: Vascular;  Laterality: Left;  . FISTULA SUPERFICIALIZATION Left 05/30/2018   Procedure: FISTULA PLICATION  BASILIC VEIN TRANSPOSITION;  Surgeon: Angelia Mould, MD;  Location: [place];  Service: Vascular;  Laterality: Left;  Marland Kitchen VENOGRAM Right 01/31/2014   Procedure: DIALYSIS CATHETER;  Surgeon: Serafina Mitchell, MD;  Location: [facility] CATH LAB;  Service: Cardiovascular;  Laterality: Right;    Allergies  Allergen Reactions  . Tobramycin Sulfate Swelling    Current Outpatient Medications  Medication Sig Dispense Refill  . albuterol (PROVENTIL HFA;VENTOLIN HFA) 108 (90 Base) MCG/ACT inhaler Inhale 1-2 puffs into the lungs every 6 (six) hours as needed for wheezing or shortness of breath. 18 g 1  . calcium acetate (PHOSLO) 667 MG capsule Take 2,001-4,002 mg by mouth  See admin instructions. Take 6 capsule (4,002 mg) by mouth three times a day with each meal and 3 capsules (2,001 mg) with each snack    . carvedilol (COREG) 12.5 MG tablet Take 12.5 mg by mouth 2 (two) times daily.    . cinacalcet (SENSIPAR) 30 MG tablet Take 1 tablet (30 mg total) by mouth daily with supper. 60 tablet 1  . darbepoetin (ARANESP) 60 MCG/0.3ML SOLN injection Inject 0.3 mLs (60 mcg total) into the vein every Wednesday with hemodialysis. (Patient taking differently: Inject 60 mcg into the vein every Saturday. ) 4.2 mL 0  . diltiazem (CARDIZEM) 30 MG tablet Take 1 tablet (30 mg total) by mouth every 6 (six) hours as needed (SVT). (Patient taking differently: Take 30 mg by mouth as needed (before dialysis). ) 60 tablet 1  . diphenhydrAMINE (BENADRYL) 25 MG tablet Take 25-50 mg by mouth See admin instructions. Take one tablet (25 mg) by mouth twice daily on Sunday, Monday, Wednesday, Friday, take two tablets (50 mg) once daily at dialysis on Tuesday, Thursday, Saturday.    . ferric gluconate 62.5 mg in sodium chloride 0.9 % 100 mL Inject 62.5 mg into the vein every Thursday with hemodialysis.    . hydroxychloroquine (PLAQUENIL) 200 MG tablet Take 1 tablet (200 mg total) by mouth daily. 30 tablet 2  . hydrOXYzine (ATARAX/VISTARIL) 10 MG tablet Take 1 tablet (10 mg total) by mouth 3 (three) times daily as needed for anxiety. 5 tablet 0  . loperamide (IMODIUM A-D) 2 MG tablet Take 2 mg by mouth See admin instructions. Take one tablet (2 mg) by mouth on dialysis days (Tuesday, Thursday, Saturday)    . multivitamin (RENA-VIT) TABS tablet Take 1 tablet by mouth daily. (Patient taking differently: Take 1 tablet by mouth at bedtime. ) 30 tablet 0  . Nutritional Supplements (NOVASOURCE RENAL) LIQD Take 237 mLs by mouth See admin instructions. Take 1 container (237 mls) by mouth daily on dialysis days (Tuesday, Thursday, Saturday) - either during or after treatment     No current facility-administered  medications for this visit.     REVIEW OF SYSTEMS: see HPI for pertinent positives and negatives    PHYSICAL EXAMINATION:  Vitals:   12/18/19 1314  BP: 133/88  Pulse: (!) 102  Resp: 16  Temp: 98.6 F (37 C)  TempSrc: Temporal  SpO2: 94%  Weight: 117 lb (53.1 kg)  Height: 5\' 4"  (1.626 m)   Body mass index is 20.08 kg/m.  General: The patient appears herstated age, young petite female.   HEENT:  No gross abnormalities Pulmonary: Respirations are non-labored, adequate air movement in all fields, no rales, rhonchi, or wheezes.  Abdomen: Soft and non-tender with normal bowel sounds. Musculoskeletal: There are no major deformities.   Neurologic: No focal weakness or paresthesias are detected Skin: There are  no rashes noted. Ulcer noted at left upper forearm AVF, pt holding pressure over this, moderate amount of bleed on chux pad.  Psychiatric: The patient has normal affect. Cardiovascular: There is a regular rate and rhythm without significant murmur appreciated.   Left upper arm AVF with + bruit and thrill.  Left radial pulse is 1-2+ palpable.    Medical Decision Making  [name] is a 38 y.o. female who presents with bleeding from her left upper arm AVF 3 or more times in the last 6 days; ulcer at AVF noted.  Dr. Trula Slade spoke with and examined pt.  She last ate a light snack at 10 AM. Pressure dressing applied to left upper arm AVF ulcer.  Will schedule plication of bleeding AVF ulcer today, pt will go to Short Stay from here. Dr. Trula Slade spoke with Dr. Donnetta Hutching re this.     Clemon Chambers, RN, MSN, FNP-C [facility] [place] Office: [phone]  12/18/2019, 1:24 PM  Clinic MD: Trula Slade

## 2019-12-18 NOTE — Discharge Instructions (Signed)
[facility]  Discharge Instructions  AV Fistula or Graft Surgery for Dialysis Access  Please refer to the following instructions for your post-procedure care. Your surgeon or physician assistant will discuss any changes with you.  Activity  You may drive the day following your surgery, if you are comfortable and no longer taking prescription pain medication. Resume full activity as the soreness in your incision resolves.  Bathing/Showering  You may shower after you go home. Keep your incision dry for 48 hours. Do not soak in a bathtub, hot tub, or swim until the incision heals completely. You may not shower if you have a hemodialysis catheter.  Incision Care  Clean your incision with mild soap and water after 48 hours. Pat the area dry with a clean towel. You do not need a bandage unless otherwise instructed. Do not apply any ointments or creams to your incision. You may have skin glue on your incision. Do not peel it off. It will come off on its own in about one week. Your arm may swell a bit after surgery. To reduce swelling use pillows to elevate your arm so it is above your heart. Your doctor will tell you if you need to lightly wrap your arm with an ACE bandage.  Diet  Resume your normal diet. There are not special food restrictions following this procedure. In order to heal from your surgery, it is CRITICAL to get adequate nutrition. Your body requires vitamins, minerals, and protein. Vegetables are the best source of vitamins and minerals. Vegetables also provide the perfect balance of protein. Processed food has little nutritional value, so try to avoid this.  Medications  Resume taking all of your medications. If your incision is causing pain, you may take over-the counter pain relievers such as acetaminophen (Tylenol). If you were prescribed a stronger pain medication, please be aware these medications can cause nausea and constipation. Prevent  nausea by taking the medication with a snack or meal. Avoid constipation by drinking plenty of fluids and eating foods with high amount of fiber, such as fruits, vegetables, and grains.  Do not take Tylenol if you are taking prescription pain medications.  DO NOT TAKE TYLENOL PM WITH PERCOCET.  Follow up Your surgeon may want to see you in the office following your access surgery. If so, this will be arranged at the time of your surgery.  Please call us immediately for any of the following conditions:  . Increased pain, redness, drainage (pus) from your incision site . Fever of 101 degrees or higher . Severe or worsening pain at your incision site . Hand pain or numbness. .  Reduce your risk of vascular disease:  . Stop smoking. If you would like help, call [facility] at 1-800-QUIT-NOW 206-272-7205) or [place] at ([phone]  . Manage your cholesterol . Maintain a desired weight . Control your diabetes . Keep your blood pressure down  Dialysis  It will take several weeks to several months for your new dialysis access to be ready for use. Your surgeon will determine when it is okay to use it. Your nephrologist will continue to direct your dialysis. You can continue to use your Permcath until your new access is ready for use.   12/18/2019 KHALEESIA BRIEGER [id number] 11-30-1981  Surgeon(s): Early, Arvilla Meres, MD  Procedure(s): PLICATION OF LEFT ARTERIOVENOUS FISTULA ULCER   x May stick graft on designated area only:  Do NOT stick over incision for 8 weeks. May stick  above incision immediately.   If you have any questions, please call the office at [phone].

## 2019-12-18 NOTE — Transfer of Care (Signed)
Immediate Anesthesia Transfer of Care Note  Patient: [name]  Procedure(s) Performed: PLICATION OF LEFT ARTERIOVENOUS FISTULA ULCER (Left Arm Upper)  Patient Location: PACU  Anesthesia Type:General  Level of Consciousness: sedated  Airway & Oxygen Therapy: Patient Spontanous Breathing  Post-op Assessment: Report given to RN and Post -op Vital signs reviewed and stable  Post vital signs: Reviewed and stable  Last Vitals:  Vitals Value Taken Time  BP 108/81 12/18/19 1900  Temp    Pulse 84 12/18/19 1904  Resp 17 12/18/19 1904  SpO2 97 % 12/18/19 1904  Vitals shown include unvalidated device data.  Last Pain:  Vitals:   12/18/19 1541  TempSrc:   PainSc: 0-No pain         Complications: No apparent anesthesia complications

## 2019-12-18 NOTE — Op Note (Signed)
    OPERATIVE REPORT  DATE OF SURGERY: 12/18/2019  PATIENT: [name], 38 y.o. female MRN: [id number]  DOB: 03/16/1981  PRE-OPERATIVE DIAGNOSIS: Bleeding from left upper arm basilic vein fistula  POST-OPERATIVE DIAGNOSIS:  Same  PROCEDURE: Resection of nonviable skin around bleeding segment and plication of fistula  SURGEON:  Curt Jews, M.D.  PHYSICIAN ASSISTANT: Liana Crocker, PA-C  ANESTHESIA: LMA  EBL: per anesthesia record  Total I/O In: 750 [I.V.:500; IV Piggyback:250] Out: -   BLOOD ADMINISTERED: none  DRAINS: none  SPECIMEN: none  COUNTS CORRECT:  YES  PATIENT DISPOSITION:  PACU - hemodynamically stable  PROCEDURE DETAILS: The patient was taken up and placed supine position where the area of the left arm was placed on an armboard.  The patient had several episodes of significant bleeding from her fistula.  An Ace wrap was intact and was maintaining hemostasis.  The fistula was occluded at the antecubital space manually and the dressing was removed.  There was a sinus hole directly into the fistula with active bleeding.  This was controlled again with proximal occlusion.  A pneumatic tourniquet was placed high on the axilla and the arm was exsanguinated with an Esmarch tourniquet.  The pneumatic tourniquet was inflated.  The arm was then prepped and draped as usual sterile fashion.  An ellipse of skin was removed around the area of the bleeding sinus into the fistula.  The patient vein was extremely calcified.  The vein area of disruption was transected.  The vein was a very large caliber.  The vein was closed longitudinally with 2 layers of 5-0 Prolene suture.  The tourniquet was deflated and several additional sutures were required for hemostasis.  The wound was irrigated with saline.  The old nonvital skin had been removed and an ellipse which left good healthy skin at both edges.  The skin itself was closed with 3-0 nylon mattress sutures.  Sterile dressing was  applied.  The patient was transferred to the recovery room in stable condition   [name], M.D., [facility] 12/18/2019 6:52 PM

## 2019-12-18 NOTE — Anesthesia Procedure Notes (Signed)
Procedure Name: LMA Insertion Date/Time: 12/18/2019 5:49 PM Performed by: Janace Litten, CRNA Pre-anesthesia Checklist: Patient identified, Emergency Drugs available, Suction available and Patient being monitored Patient Re-evaluated:Patient Re-evaluated prior to induction Oxygen Delivery Method: Circle System Utilized Preoxygenation: Pre-oxygenation with 100% oxygen Induction Type: IV induction Ventilation: Mask ventilation without difficulty LMA: LMA inserted LMA Size: 3.0 Number of attempts: 1 Placement Confirmation: positive ETCO2 Tube secured with: Tape Dental Injury: Teeth and Oropharynx as per pre-operative assessment

## 2019-12-18 NOTE — Anesthesia Preprocedure Evaluation (Addendum)
Anesthesia Evaluation  Patient identified by MRN, date of birth, ID band Patient awake    Reviewed: Allergy & Precautions, NPO status , Patient's Chart, lab work & pertinent test results, reviewed documented beta blocker date and time   Airway Mallampati: II  TM Distance: >3 FB Neck ROM: Full    Dental  (+) Teeth Intact, Dental Advisory Given, Loose,    Pulmonary Current SmokerPatient did not abstain from smoking.,    Pulmonary exam normal breath sounds clear to auscultation       Cardiovascular hypertension, Pt. on home beta blockers (-) angina+CHF  (-) Past MI Normal cardiovascular exam Rhythm:Regular Rate:Normal  Echo 02/17/2017: - Normal LV size with moderate LV hypertrophy. EF 45%.   Inferolateral hypokinesis. Moderate diastolic dysfunction. Normal RV size and systolic function. Restricted posterior mitral leaflet with severe mitral regurgitation (?infarct-related with inferolateral wall motion abnormality). Severe left atrial enlargement. Mild pulmonary hypertension.   Neuro/Psych Seizures -,  negative psych ROS   GI/Hepatic negative GI ROS, (+)     substance abuse  cocaine use and marijuana use,   Endo/Other  SLE   Renal/GU ESRF and DialysisRenal disease (TTHSat)     Musculoskeletal negative musculoskeletal ROS (+)   Abdominal   Peds  Hematology  (+) Blood dyscrasia, anemia ,   Anesthesia Other Findings Day of surgery medications reviewed with the patient.  Reproductive/Obstetrics                            Anesthesia Physical Anesthesia Plan  ASA: III  Anesthesia Plan: General   Post-op Pain Management:    Induction: Intravenous  PONV Risk Score and Plan: 2 and Midazolam, Dexamethasone and Ondansetron  Airway Management Planned: LMA  Additional Equipment:   Intra-op Plan:   Post-operative Plan: Extubation in OR  Informed Consent: I have reviewed the patients History  and Physical, chart, labs and discussed the procedure including the risks, benefits and alternatives for the proposed anesthesia with the patient or authorized representative who has indicated his/her understanding and acceptance.     Dental advisory given  Plan Discussed with: CRNA  Anesthesia Plan Comments:         Anesthesia Quick Evaluation

## 2019-12-19 NOTE — Anesthesia Postprocedure Evaluation (Signed)
Anesthesia Post Note  Patient: ENEIDA BASSI  Procedure(s) Performed: PLICATION OF LEFT ARTERIOVENOUS FISTULA ULCER (Left Arm Upper)     Patient location during evaluation: PACU Anesthesia Type: General Level of consciousness: awake and alert Pain management: pain level controlled Vital Signs Assessment: post-procedure vital signs reviewed and stable Respiratory status: spontaneous breathing, nonlabored ventilation, respiratory function stable and patient connected to nasal cannula oxygen Cardiovascular status: blood pressure returned to baseline and stable Postop Assessment: no apparent nausea or vomiting Anesthetic complications: no    Last Vitals:  Vitals:   12/18/19 1915 12/18/19 1929  BP: 107/81 113/76  Pulse: 81 80  Resp: 16 15  Temp:  36.8 C  SpO2: 96% 96%    Last Pain:  Vitals:   12/18/19 1929  TempSrc:   PainSc: 0-No pain                 Pietra Zuluaga COKER

## 2019-12-21 NOTE — H&P (Addendum)
[name], [name] Lair, NP    [name], [name] Lair, NP  Nurse Practitioner  Specialty:  Vascular Surgery     Progress Notes      Signed     Encounter Date:  12/18/2019        Related encounter: Clinical Support from 12/18/2019 in [facility]             Signed                    Expand widget buttonCollapse widget button    Show:Clear all   ManualTemplateCopied  Added by:     [name], [name] Lair, NP   Hover for detailscustomization button                                                                                                               untitled image        Established Dialysis Access     History of Present Illness     [name] is a 38 y.o. (1981-01-29) female who is s/p plication of left upper arm fistula on 05-30-18 by Dr. Edilia Bo for   aneurysmal left upper arm AV fistula.      Pt was last evaluated on 08-16-19 by [facility]. At that time the impression was possible infected AV fistula without synthetic components. Pt was afebrile, there was no active drainage or ulcer over the fistula.  Pt reported the fistula was working well. [name] advised contiue the IV antibiotics until culture results in.  If there is growth on culture treat with appropriate antibiotics. F/U PRN.     Pt returns today with c/o 2 episodes of bleeding from her AVF at home, started bleeding while in her shower 6 days ago, once in her KC. She was seen in [facility] ED twice this: 12-15-and 12-16-19.     She dialyzes TTS via left upper arm AVF at [facility].      She is right hand dominant.   This is her first permanent access.      She denies any steal type symptoms in her left hand.      She has been nauseated and vomiting starting today. She denies loss of  sense of smell, denies cough.   She is being given antibiotic in HD, she states she thinks this has nauseated her.       She had breakfast about 7:30 this morning. She ate some chips about about 10 am.              Past Medical History:    Diagnosis   Date    .   Anemia            low iron - receives iron at dialysis    .   Chronic systolic congestive heart failure (HCC)   03/16/2016    .   Dyspnea        .  ESRD (end stage renal disease) (HCC)            [facility] _ [address]    .   H/O pericarditis   01/17/2013    .   H/O pleural effusion   01/17/2013    .   Heart murmur        .   Lupus (systemic lupus erythematosus) (HCC)            Previously followed with Dr. Kellie Simmering, has not followed up recently    .   Lupus nephritis (HCC)   2006        Renal biopsy shows segmental endocapillary proliferation and cellular crescent formation (Class IIIA) and lupus membranous glomerulopathy (Class V, stage II)    .   Pneumonia            many times    .   Polysubstance abuse (HCC)            cocaine, MJ, tobacco    .   S/P pericardiocentesis   01/17/2013        H/o pericardial effusion with tamponade 2006     .   Seizures (HCC)            during pregnancy 1 time    .   Streptococcal bacteremia   01/23/2013        She had two S. pneumonae bacteremia on 01/21/2013. Sensitive to Peniccilin           Social History   Social History             Tobacco Use    .   Smoking status:   Current Every Day Smoker            Packs/day:   0.12            Years:   15.00            Pack years:   1.80            Types:   Cigarettes    .   Smokeless tobacco:   Never Used    .   Tobacco comment: 4 cigarettes per day    Substance Use Topics    .   Alcohol use:   Yes            Alcohol/week:   0.0  standard drinks            Comment: Special Occasional takes [employer]    .   Drug use:   Yes            Frequency:   5.0 times per week            Types:   Marijuana, Cocaine            Comment: Marijuana last time mid May 2019, Cocaine- last tile mid April 2019          Family History  No family history on file.     Surgical History        Past Surgical History:    Procedure   Laterality   Date    .   AV FISTULA PLACEMENT            .   BASCILIC VEIN TRANSPOSITION   Left   02/05/2014        Procedure: BASCILIC VEIN TRANSPOSITION;  Surgeon: Larina Earthly, MD;  Location[facility] OR;  Service: Vascular;  Laterality: Left;    .  FISTULA SUPERFICIALIZATION   Left   05/30/2018        Procedure: FISTULA PLICATION BASILIC VEIN TRANSPOSITION;  Surgeon: Chuck Hint, MD;  Location: [facility] OR;  Service: Vascular;  Laterality: Left;    Marland Kitchen   VENOGRAM   Right   01/31/2014        Procedure: DIALYSIS CATHETER;  Surgeon: Nada Libman, MD;  Location: [facility] CATH LAB;  Service: Cardiovascular;  Laterality: Right;               Allergies    Allergen   Reactions    .   Tobramycin Sulfate   Swelling                 Current Outpatient Medications    Medication   Sig   Dispense   Refill    .   albuterol (PROVENTIL HFA;VENTOLIN HFA) 108 (90 Base) MCG/ACT inhaler   Inhale 1-2 puffs into the lungs every 6 (six) hours as needed for wheezing or shortness of breath.   18 g   1    .   calcium acetate (PHOSLO) 667 MG capsule   Take 2,001-4,002 mg by mouth See admin instructions. Take 6 capsule (4,002 mg) by mouth three times a day with each meal and 3 capsules (2,001 mg) with each snack            .   carvedilol (COREG) 12.5 MG tablet   Take 12.5 mg by mouth 2 (two) times daily.            .   cinacalcet (SENSIPAR) 30 MG tablet   Take 1 tablet (30 mg total) by mouth  daily with supper.   60 tablet   1    .   darbepoetin (ARANESP) 60 MCG/0.3ML SOLN injection   Inject 0.3 mLs (60 mcg total) into the vein every Wednesday with hemodialysis. (Patient taking differently: Inject 60 mcg into the vein every Saturday. )   4.2 mL   0    .   diltiazem (CARDIZEM) 30 MG tablet   Take 1 tablet (30 mg total) by mouth every 6 (six) hours as needed (SVT). (Patient taking differently: Take 30 mg by mouth as needed (before dialysis). )   60 tablet   1    .   diphenhydrAMINE (BENADRYL) 25 MG tablet   Take 25-50 mg by mouth See admin instructions. Take one tablet (25 mg) by mouth twice daily on Sunday, Monday, Wednesday, Friday, take two tablets (50 mg) once daily at dialysis on Tuesday, Thursday, Saturday.            .   ferric gluconate 62.5 mg in sodium chloride 0.9 % 100 mL   Inject 62.5 mg into the vein every Thursday with hemodialysis.            .   hydroxychloroquine (PLAQUENIL) 200 MG tablet   Take 1 tablet (200 mg total) by mouth daily.   30 tablet   2    .   hydrOXYzine (ATARAX/VISTARIL) 10 MG tablet   Take 1 tablet (10 mg total) by mouth 3 (three) times daily as needed for anxiety.   5 tablet   0    .   loperamide (IMODIUM A-D) 2 MG tablet   Take 2 mg by mouth See admin instructions. Take one tablet (2 mg) by mouth on dialysis days (Tuesday, Thursday, Saturday)            .   multivitamin (  RENA-VIT) TABS tablet   Take 1 tablet by mouth daily. (Patient taking differently: Take 1 tablet by mouth at bedtime. )   30 tablet   0    .   Nutritional Supplements (NOVASOURCE RENAL) LIQD   Take 237 mLs by mouth See admin instructions. Take 1 container (237 mls) by mouth daily on dialysis days (Tuesday, Thursday, Saturday) - either during or after treatment                No current facility-administered medications for this visit.             REVIEW OF SYSTEMS: see HPI for pertinent  positives and negatives         PHYSICAL EXAMINATION:         Vitals:        12/18/19 1314    BP:   133/88    Pulse:   (!) 102    Resp:   16    Temp:   98.6 F (37 C)    TempSrc:   Temporal    SpO2:   94%    Weight:   117 lb (53.1 kg)    Height:   5\' 4"  (1.626 m)       Body mass index is 20.08 kg/m.     General: The patient appears herstated age, young petite female.    HEENT:  No gross abnormalities  Pulmonary: Respirations are non-labored, adequate air movement in all fields, no rales, rhonchi, or wheezes.   Abdomen: Soft and non-tender with normal bowel sounds.  Musculoskeletal: There are no major deformities.    Neurologic: No focal weakness or paresthesias are detected  Skin: There are no rashes noted. Ulcer noted at left upper forearm AVF, pt holding pressure over this, moderate amount of bleed on chux pad.   Psychiatric: The patient has normal affect.  Cardiovascular: There is a regular rate and rhythm without significant murmur appreciated.      Left upper arm AVF with + bruit and thrill.   Left radial pulse is 1-2+ palpable.         Medical Decision Making     [name] is a 38 y.o. female who presents with bleeding from her left upper arm AVF 3 or more times in the last 6 days; ulcer at AVF noted.   Dr. Myra Gianotti spoke with and examined pt.   She last ate a light snack at 10 AM.  Pressure dressing applied to left upper arm AVF ulcer.   Will schedule plication of bleeding AVF ulcer today, pt will go to Short Stay from here.  Dr. Myra Gianotti spoke with Dr. Arbie Cookey re this.            Charisse March, RN, MSN, FNP-C  [facility] [place]  Office: [phone]     12/18/2019, 1:24 PM     Clinic MD: Myra Gianotti       I have examined the patient, reviewed and agree with above.  Gretta Began, MD 12/21/2019 10:14 AM      Addendum:  The patient has been  re-examined and re-evaluated.  The patient's history and physical has been reviewed and is unchanged.    [name] is a 38 y.o. female is being admitted with BLEEDING ULCER LEFT UPPER ARM FISTULA. All the risks, benefits and other treatment options have been discussed with the patient. The patient has consented to proceed with Procedure(s): PLICATION OF LEFT ARTERIOVENOUS FISTULA ULCER as a surgical  intervention.  Alianys Chacko 12/21/2019 10:14 AM Vascular and Vein Surgery

## 2020-01-09 ENCOUNTER — Other Ambulatory Visit: Payer: Self-pay

## 2020-01-09 ENCOUNTER — Ambulatory Visit (INDEPENDENT_AMBULATORY_CARE_PROVIDER_SITE_OTHER): Payer: Self-pay | Admitting: Vascular Surgery

## 2020-01-09 ENCOUNTER — Encounter: Payer: Self-pay | Admitting: Vascular Surgery

## 2020-01-09 VITALS — BP 142/84 | HR 100 | Temp 97.7°F | Resp 20 | Ht 64.0 in | Wt 114.0 lb

## 2020-01-09 DIAGNOSIS — Z992 Dependence on renal dialysis: Secondary | ICD-10-CM

## 2020-01-09 DIAGNOSIS — N186 End stage renal disease: Secondary | ICD-10-CM

## 2020-01-09 NOTE — Progress Notes (Signed)
Patient name: [name] MRN: [id number] DOB: August 02, 1981 Sex: female  REASON FOR VISIT: Follow-up recent revision of left upper arm AV fistula  HPI: [name] is a 39 y.o. female here today for follow-up.  Had significant bleeding disruption of her left upper arm AV fistula.  Was taken emergently to the operating room on 1221 where she underwent resection of nonviable skin and plication of her vein and reclosure.  Current Outpatient Medications  Medication Sig Dispense Refill  . acetaminophen (TYLENOL) 500 MG tablet Take by mouth.    . B Complex-C-Folic Acid (RENAL VITAMIN) 0.8 MG TABS renal vitamin    . calcium acetate (PHOSLO) 667 MG capsule Take 1,334-2,668 mg by mouth See admin instructions. Take 4 capsule (2668 mg) by mouth three times a day with each meal and 2 capsules (1334 mg) with each snack    . carvedilol (COREG) 12.5 MG tablet Take 12.5 mg by mouth 2 (two) times daily. Take one tablet (12.5 mg) by mouth every morning, take one tablet (12.5 mg) at night on Sunday, Monday, Wednesday, Friday    . darbepoetin (ARANESP) 60 MCG/0.3ML SOLN injection Inject 0.3 mLs (60 mcg total) into the vein every Wednesday with hemodialysis. (Patient taking differently: Inject 60 mcg into the vein every Saturday. ) 4.2 mL 0  . Darbepoetin Alfa (ARANESP, ALBUMIN FREE, IJ) Darbepoetin Alfa (Aranesp)    . diltiazem (CARDIZEM) 30 MG tablet Take 1 tablet (30 mg total) by mouth every 6 (six) hours as needed (SVT). (Patient taking differently: Take 30 mg by mouth 2 (two) times daily as needed (SVT). ) 60 tablet 1  . diphenhydrAMINE (BENADRYL) 25 MG tablet Take 25-50 mg by mouth See admin instructions. Take one tablet (25 mg) by mouth daily at bedtime, take two tablets (50 mg) immediately prior to dialysis - Tuesday, Thursday, Saturday    . diphenhydramine-acetaminophen (TYLENOL PM) 25-500 MG TABS tablet Take 0.5-1 tablets by mouth at bedtime as needed (sleep).    .  Ensure (ENSURE) Take 118.5 mLs by mouth daily.    . ferric gluconate 62.5 mg in sodium chloride 0.9 % 100 mL Inject 62.5 mg into the vein every Thursday with hemodialysis.    . hydroxychloroquine (PLAQUENIL) 200 MG tablet Take 1 tablet (200 mg total) by mouth daily. 30 tablet 2  . hydrOXYzine (ATARAX/VISTARIL) 10 MG tablet Take 1 tablet (10 mg total) by mouth 3 (three) times daily as needed for anxiety. (Patient taking differently: Take 10 mg by mouth 3 (three) times daily as needed for itching or anxiety. ) 5 tablet 0  . iron sucrose in sodium chloride 0.9 % 100 mL Iron Sucrose (Venofer)    . loperamide (IMODIUM A-D) 2 MG tablet Take 4 mg by mouth See admin instructions. Take two tablest (4 mg) by mouth on dialysis days (Tuesday, Thursday, Saturday)    . multivitamin (RENA-VIT) TABS tablet Take 1 tablet by mouth daily. (Patient taking differently: Take 1 tablet by mouth at bedtime. ) 30 tablet 0  . Nutritional Supplements (NOVASOURCE RENAL) LIQD Take 237 mLs by mouth See admin instructions. Take 1 container (237 mls) by at bedtime on dialysis days (Tuesday, Thursday, Saturday)    . oxyCODONE-acetaminophen (PERCOCET) 5-325 MG tablet Take 1 tablet by mouth every 6 (six) hours as needed. 10 tablet 0   No current facility-administered medications for this visit.     PHYSICAL EXAM: Vitals:   01/09/20 1518  BP: (!) 142/84  Pulse: 100  Resp: 20  Temp: 97.7  F (36.5 C)  SpO2: 96%  Weight: 114 lb (51.7 kg)  Height: 5\' 4"  (1.626 m)    GENERAL: The patient is a well-nourished female, in no acute distress. The vital signs are documented above. Excellent healing of her incision.  Excellent thrill throughout.  MEDICAL ISSUES: Her dialysis staff has been doing a good job of accessing the fistula above this plication repair.  We will remove the sutures today.  Can begin using this area in another 2 weeks.  She is currently 3 weeks out from her surgery   [name], MD [facility] Vascular and Vein  [facility] Tel [phone] Pager [phone]

## 2020-02-29 ENCOUNTER — Encounter: Payer: Self-pay | Admitting: Nephrology

## 2020-03-08 ENCOUNTER — Ambulatory Visit (INDEPENDENT_AMBULATORY_CARE_PROVIDER_SITE_OTHER): Payer: Medicaid Other | Admitting: Primary Care

## 2020-03-08 ENCOUNTER — Other Ambulatory Visit: Payer: Self-pay

## 2020-05-18 ENCOUNTER — Other Ambulatory Visit: Payer: Self-pay

## 2020-05-18 ENCOUNTER — Encounter (HOSPITAL_COMMUNITY): Payer: Self-pay | Admitting: Emergency Medicine

## 2020-05-18 ENCOUNTER — Inpatient Hospital Stay (HOSPITAL_COMMUNITY)
Admission: EM | Admit: 2020-05-18 | Discharge: 2020-05-20 | DRG: 314 | Disposition: A | Discharge status: Home / Self Care | Payer: Medicaid Other | Attending: Internal Medicine | Admitting: Internal Medicine

## 2020-05-18 DIAGNOSIS — Z20822 Contact with and (suspected) exposure to covid-19: Secondary | ICD-10-CM | POA: Diagnosis present

## 2020-05-18 DIAGNOSIS — Z95828 Presence of other vascular implants and grafts: Secondary | ICD-10-CM

## 2020-05-18 DIAGNOSIS — R188 Other ascites: Secondary | ICD-10-CM | POA: Diagnosis present

## 2020-05-18 DIAGNOSIS — D649 Anemia, unspecified: Secondary | ICD-10-CM | POA: Diagnosis present

## 2020-05-18 DIAGNOSIS — R569 Unspecified convulsions: Secondary | ICD-10-CM | POA: Diagnosis present

## 2020-05-18 DIAGNOSIS — E875 Hyperkalemia: Secondary | ICD-10-CM | POA: Diagnosis present

## 2020-05-18 DIAGNOSIS — N186 End stage renal disease: Secondary | ICD-10-CM

## 2020-05-18 DIAGNOSIS — Z992 Dependence on renal dialysis: Secondary | ICD-10-CM

## 2020-05-18 DIAGNOSIS — Z419 Encounter for procedure for purposes other than remedying health state, unspecified: Secondary | ICD-10-CM | POA: Diagnosis not present

## 2020-05-18 DIAGNOSIS — Y832 Surgical operation with anastomosis, bypass or graft as the cause of abnormal reaction of the patient, or of later complication, without mention of misadventure at the time of the procedure: Secondary | ICD-10-CM | POA: Diagnosis present

## 2020-05-18 DIAGNOSIS — Z79899 Other long term (current) drug therapy: Secondary | ICD-10-CM

## 2020-05-18 DIAGNOSIS — F17210 Nicotine dependence, cigarettes, uncomplicated: Secondary | ICD-10-CM | POA: Diagnosis present

## 2020-05-18 DIAGNOSIS — R7401 Elevation of levels of liver transaminase levels: Secondary | ICD-10-CM | POA: Diagnosis present

## 2020-05-18 DIAGNOSIS — T82898A Other specified complication of vascular prosthetic devices, implants and grafts, initial encounter: Secondary | ICD-10-CM

## 2020-05-18 DIAGNOSIS — N2581 Secondary hyperparathyroidism of renal origin: Secondary | ICD-10-CM | POA: Diagnosis present

## 2020-05-18 DIAGNOSIS — I132 Hypertensive heart and chronic kidney disease with heart failure and with stage 5 chronic kidney disease, or end stage renal disease: Secondary | ICD-10-CM | POA: Diagnosis present

## 2020-05-18 DIAGNOSIS — I5022 Chronic systolic (congestive) heart failure: Secondary | ICD-10-CM | POA: Diagnosis present

## 2020-05-18 DIAGNOSIS — M3219 Other organ or system involvement in systemic lupus erythematosus: Secondary | ICD-10-CM

## 2020-05-18 DIAGNOSIS — F1990 Other psychoactive substance use, unspecified, uncomplicated: Secondary | ICD-10-CM | POA: Diagnosis present

## 2020-05-18 DIAGNOSIS — E8779 Other fluid overload: Secondary | ICD-10-CM | POA: Diagnosis present

## 2020-05-18 DIAGNOSIS — M3214 Glomerular disease in systemic lupus erythematosus: Secondary | ICD-10-CM | POA: Diagnosis present

## 2020-05-18 DIAGNOSIS — T82590A Other mechanical complication of surgically created arteriovenous fistula, initial encounter: Secondary | ICD-10-CM

## 2020-05-18 DIAGNOSIS — Z888 Allergy status to other drugs, medicaments and biological substances status: Secondary | ICD-10-CM

## 2020-05-18 DIAGNOSIS — E8889 Other specified metabolic disorders: Secondary | ICD-10-CM | POA: Diagnosis present

## 2020-05-18 DIAGNOSIS — M329 Systemic lupus erythematosus, unspecified: Secondary | ICD-10-CM | POA: Diagnosis present

## 2020-05-18 LAB — BASIC METABOLIC PANEL
Anion gap: 16 — ABNORMAL HIGH (ref 5–15)
BUN: 65 mg/dL — ABNORMAL HIGH (ref 6–20)
CO2: 30 mmol/L (ref 22–32)
Calcium: 9.8 mg/dL (ref 8.9–10.3)
Chloride: 93 mmol/L — ABNORMAL LOW (ref 98–111)
Creatinine, Ser: 9.61 mg/dL — ABNORMAL HIGH (ref 0.44–1.00)
GFR calc Af Amer: 5 mL/min — ABNORMAL LOW (ref >60)
GFR calc non Af Amer: 5 mL/min — ABNORMAL LOW (ref >60)
Glucose, Bld: 83 mg/dL (ref 70–99)
Potassium: 5.2 mmol/L — ABNORMAL HIGH (ref 3.5–5.1)
Sodium: 139 mmol/L (ref 135–145)

## 2020-05-18 LAB — CBC WITH DIFFERENTIAL/PLATELET
Abs Immature Granulocytes: 0.01 10*3/uL (ref 0.00–0.07)
Basophils Absolute: 0 10*3/uL (ref 0.0–0.1)
Basophils Relative: 0 %
Eosinophils Absolute: 0.1 10*3/uL (ref 0.0–0.5)
Eosinophils Relative: 2 %
HCT: 30 % — ABNORMAL LOW (ref 36.0–46.0)
Hemoglobin: 8.9 g/dL — ABNORMAL LOW (ref 12.0–15.0)
Immature Granulocytes: 0 %
Lymphocytes Relative: 18 %
Lymphs Abs: 0.8 10*3/uL (ref 0.7–4.0)
MCH: 27.1 pg (ref 26.0–34.0)
MCHC: 29.7 g/dL — ABNORMAL LOW (ref 30.0–36.0)
MCV: 91.2 fL (ref 80.0–100.0)
Monocytes Absolute: 0.6 10*3/uL (ref 0.1–1.0)
Monocytes Relative: 12 %
Neutro Abs: 3.2 10*3/uL (ref 1.7–7.7)
Neutrophils Relative %: 68 %
Platelets: 194 10*3/uL (ref 150–400)
RBC: 3.29 MIL/uL — ABNORMAL LOW (ref 3.87–5.11)
RDW: 17.6 % — ABNORMAL HIGH (ref 11.5–15.5)
WBC: 4.7 10*3/uL (ref 4.0–10.5)
nRBC: 0 % (ref 0.0–0.2)

## 2020-05-18 LAB — SARS CORONAVIRUS 2 BY RT PCR (HOSPITAL ORDER, PERFORMED IN ~~LOC~~ HOSPITAL LAB): SARS Coronavirus 2: NEGATIVE

## 2020-05-18 LAB — C-REACTIVE PROTEIN: CRP: 0.9 mg/dL (ref <1.0)

## 2020-05-18 LAB — HIV ANTIBODY (ROUTINE TESTING W REFLEX): HIV Screen 4th Generation wRfx: NONREACTIVE

## 2020-05-18 LAB — SURGICAL PCR SCREEN
MRSA, PCR: NEGATIVE
Staphylococcus aureus: POSITIVE — AB

## 2020-05-18 LAB — SEDIMENTATION RATE: Sed Rate: 80 mm/hr — ABNORMAL HIGH (ref 0–22)

## 2020-05-18 MED ORDER — VANCOMYCIN HCL IN DEXTROSE 1-5 GM/200ML-% IV SOLN
1000.0000 mg | Freq: Once | INTRAVENOUS | Status: AC
  Administered 2020-05-18: 1000 mg via INTRAVENOUS
  Filled 2020-05-18: qty 200

## 2020-05-18 MED ORDER — CINACALCET HCL 30 MG PO TABS
30.0000 mg | ORAL_TABLET | Freq: Every day | ORAL | Status: DC
  Administered 2020-05-18 – 2020-05-20 (×3): 30 mg via ORAL
  Filled 2020-05-18 (×3): qty 1

## 2020-05-18 MED ORDER — ONDANSETRON HCL 4 MG/2ML IJ SOLN
4.0000 mg | Freq: Four times a day (QID) | INTRAMUSCULAR | Status: DC | PRN
  Administered 2020-05-18 – 2020-05-19 (×2): 4 mg via INTRAVENOUS
  Filled 2020-05-18 (×2): qty 2

## 2020-05-18 MED ORDER — CARVEDILOL 12.5 MG PO TABS
12.5000 mg | ORAL_TABLET | Freq: Two times a day (BID) | ORAL | Status: DC
  Administered 2020-05-18 – 2020-05-20 (×5): 12.5 mg via ORAL
  Filled 2020-05-18 (×5): qty 1

## 2020-05-18 MED ORDER — ONDANSETRON HCL 4 MG/2ML IJ SOLN
4.0000 mg | Freq: Once | INTRAMUSCULAR | Status: AC
  Administered 2020-05-18: 4 mg via INTRAVENOUS
  Filled 2020-05-18: qty 2

## 2020-05-18 MED ORDER — DILTIAZEM HCL 60 MG PO TABS
30.0000 mg | ORAL_TABLET | Freq: Two times a day (BID) | ORAL | Status: DC | PRN
  Filled 2020-05-18: qty 1

## 2020-05-18 MED ORDER — ONDANSETRON HCL 4 MG PO TABS: 4.0000 mg | ORAL_TABLET | Freq: Four times a day (QID) | ORAL | Status: DC | PRN

## 2020-05-18 MED ORDER — HEPARIN SODIUM (PORCINE) 5000 UNIT/ML IJ SOLN
5000.0000 [IU] | Freq: Three times a day (TID) | INTRAMUSCULAR | Status: DC
  Administered 2020-05-18 – 2020-05-20 (×4): 5000 [IU] via SUBCUTANEOUS
  Filled 2020-05-18 (×5): qty 1

## 2020-05-18 MED ORDER — VANCOMYCIN HCL 500 MG/100ML IV SOLN
500.0000 mg | INTRAVENOUS | Status: DC
  Administered 2020-05-19: 500 mg via INTRAVENOUS

## 2020-05-18 MED ORDER — ACETAMINOPHEN 325 MG PO TABS
650.0000 mg | ORAL_TABLET | Freq: Four times a day (QID) | ORAL | Status: DC | PRN
  Administered 2020-05-19: 650 mg via ORAL
  Filled 2020-05-18: qty 2

## 2020-05-18 MED ORDER — ACETAMINOPHEN 650 MG RE SUPP: 650.0000 mg | Freq: Four times a day (QID) | RECTAL | Status: DC | PRN

## 2020-05-18 MED ORDER — HYDROXYZINE HCL 10 MG PO TABS
10.0000 mg | ORAL_TABLET | Freq: Three times a day (TID) | ORAL | Status: DC | PRN
  Administered 2020-05-18: 10 mg via ORAL
  Filled 2020-05-18: qty 1

## 2020-05-18 MED ORDER — SODIUM CHLORIDE 0.9 % IV SOLN
1.5000 g | INTRAVENOUS | Status: AC
  Filled 2020-05-18: qty 1.5

## 2020-05-18 NOTE — Hospital Course (Signed)
Admitted 05/18/2020  Allergies: Tobramycin sulfate Pertinent Hx: ESRD (TTS), HTN, HLD, lupus  39 y.o. female p/w fistula clotting  * Infiltrated vs infected fistula: Red, swollen, painful, clotted. VSS. BMP K 5.2, BUN 65, Cr 9.6. CBC unremarkable. Given vanc and cefuroxime. Vascular saw, taking for [facility] placement tomorrow.   Consults: Vascular  Meds: Coreg, diltiazem PRN, plaquenil, hydroxyzine VTE ppx: Heparin IVF: None Diet: Renal

## 2020-05-18 NOTE — Care Management (Signed)
Patient has been previously seen by [place] practice. Patient is medicaid and should be assigned a practitioner Will put into discharge instruction to call and make a appt to be seen as follow up at [facility] again. Marland Kitchen

## 2020-05-18 NOTE — H&P (Addendum)
Date: 05/18/2020               Patient Name:  [name] MRN: [id number]  DOB: 1981/06/12 Age / Sex: 39 y.o., female   PCP: Patient, No Pcp Per         Medical Service: Internal Medicine Teaching Service         Attending Physician: Dr. Heber Ovilla, Rachel Moulds, DO    First Contact: Dr. Madilyn Fireman Pager: [phone]  Second Contact: Dr. Sherry Ruffing  Pager: [phone]       After Hours (After 5p/  First Contact Pager: [phone]  weekends / holidays): Second Contact Pager: [phone]   Chief Complaint: swollen fistula  History of Present Illness: Ms. Krah is a 39 yo F w/ a PMHx of ESRD on HD TTS, SLE, chronic systolic congestive heart failure (LVEF 45%, 01/2017), HTN and polysubstance use who presents to the ED via PTAR from dialysis with infiltration of her dialysis fistula.   Patient reports she has had trouble with her fistula since surgery in December.  Surgery back in December was a plication of her left upper extremity fistula ulcer.  The nonviable skin around the ulcer was resected.  There has been an area that has failed to heal properly since that time. She says that 2 weeks ago one of the stitches popped open and last night when she was cleaning her fistula, she noted pustular discharge. She put a Band-Aid over that part of her fistula and at dialysis this morning they used a different part of her fistula.  Unfortunately, when they placed the needle for dialysis, her fistula and area around it began to swell.  She reports some recent fatigue but no fevers or chills.  She denies runny nose. She denies chest pain or increase in her baseline shortness of breath from her known heart failure.  She has had nausea for the last week when she wakes up in the morning, but denies diarrhea, abdominal pain and vomiting.  She denies sick contacts.  She does not make urine anymore.  She denies falls headaches and syncope.  Meds:  Current Meds  Medication Sig  . acetaminophen (TYLENOL) 500 MG tablet Take by  mouth.  . B Complex-C-Folic Acid (RENAL VITAMIN) 0.8 MG TABS Take 1 tablet by mouth daily.   . calcium acetate (PHOSLO) 667 MG capsule Take 1,334-2,668 mg by mouth See admin instructions. Take 4 capsule (2668 mg) by mouth three times a day with each meal and 2 capsules (1334 mg) with each snack  . carvedilol (COREG) 12.5 MG tablet Take 12.5 mg by mouth 2 (two) times daily. Take one tablet (12.5 mg) by mouth every morning, take one tablet (12.5 mg) at night on Sunday, Monday, Wednesday, Friday  . Cinacalcet HCl (SENSIPAR PO) Take 1 Dose by mouth daily.  . darbepoetin (ARANESP) 60 MCG/0.3ML SOLN injection Inject 0.3 mLs (60 mcg total) into the vein every Wednesday with hemodialysis. (Patient taking differently: Inject 60 mcg into the vein every Saturday. )  . diltiazem (CARDIZEM) 30 MG tablet Take 1 tablet (30 mg total) by mouth every 6 (six) hours as needed (SVT). (Patient taking differently: Take 30 mg by mouth 2 (two) times daily as needed (SVT). )  . diphenhydrAMINE (BENADRYL) 25 MG tablet Take 25-50 mg by mouth See admin instructions. Take one tablet (25 mg) by mouth daily at bedtime, take two tablets (50 mg) immediately prior to dialysis - Tuesday, Thursday, Saturday  . diphenhydramine-acetaminophen (TYLENOL PM) 25-500 MG  TABS tablet Take 0.5-1 tablets by mouth at bedtime as needed (sleep).  . ferric gluconate 62.5 mg in sodium chloride 0.9 % 100 mL Inject 62.5 mg into the vein every Thursday with hemodialysis.  . hydroxychloroquine (PLAQUENIL) 200 MG tablet Take 1 tablet (200 mg total) by mouth daily.  . hydrOXYzine (ATARAX/VISTARIL) 10 MG tablet Take 1 tablet (10 mg total) by mouth 3 (three) times daily as needed for anxiety. (Patient taking differently: Take 10 mg by mouth 3 (three) times daily as needed for itching or anxiety. )  . iron sucrose in sodium chloride 0.9 % 100 mL Iron Sucrose (Venofer)  . loperamide (IMODIUM A-D) 2 MG tablet Take 4 mg by mouth See admin instructions. Take two  tablest (4 mg) by mouth on dialysis days (Tuesday, Thursday, Saturday)  . Methoxy PEG-Epoetin Beta (MIRCERA IJ) Inject 1 Dose as directed every 14 (fourteen) days.  . multivitamin (RENA-VIT) TABS tablet Take 1 tablet by mouth daily. (Patient taking differently: Take 1 tablet by mouth at bedtime. )  . Nutritional Supplements (NOVASOURCE RENAL) LIQD Take 237 mLs by mouth See admin instructions. Take 1 container (237 mls) by at bedtime on dialysis days (Tuesday, Thursday, Saturday)   Allergies: Allergies as of 05/18/2020 - Review Complete 05/18/2020  Allergen Reaction Noted  . Tobramycin sulfate Swelling 07/05/2012   Past Medical History:  Diagnosis Date  . Anemia    low iron - receives iron at dialysis  . Chronic systolic congestive heart failure ([place]) 03/16/2016  . Dyspnea   . ESRD (end stage renal disease) ([place])    [facility] TTHSAT _ [place]  . H/O pericarditis 01/17/2013  . H/O pleural effusion 01/17/2013  . Heart murmur   . Lupus (systemic lupus erythematosus) ([facility])    Previously followed with Dr. Charlestine Night, has not followed up recently  . 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 Pneumonia    many times  . Polysubstance abuse ([place])    cocaine, MJ, tobacco  . S/P pericardiocentesis 01/17/2013   H/o pericardial effusion with tamponade 2006   . Seizures ([place])    during pregnancy 1 time  . Streptococcal bacteremia 01/23/2013   She had two S. pneumonae bacteremia on 01/21/2013. Sensitive to Peniccilin    Past Surgical History:  Procedure Laterality Date  . AV FISTULA PLACEMENT    . BASCILIC VEIN TRANSPOSITION Left 02/05/2014   Procedure: [place];  Surgeon: [name], MD;  Location: [facility];  Service: Vascular;  Laterality: Left;  . FISTULA SUPERFICIALIZATION Left 05/30/2018   Procedure: FISTULA PLICATION BASILIC VEIN  TRANSPOSITION;  Surgeon: Angelia Mould, MD;  Location: [facility];  Service: Vascular;  Laterality: Left;  . FISTULA SUPERFICIALIZATION Left 78/58/8502   Procedure: PLICATION OF LEFT ARTERIOVENOUS FISTULA ULCER;  Surgeon: [name], MD;  Location: [facility];  Service: Vascular;  Laterality: Left;  Marland Kitchen VENOGRAM Right 01/31/2014   Procedure: DIALYSIS CATHETER;  Surgeon: Serafina Mitchell, MD;  Location: [facility] CATH LAB;  Service: Cardiovascular;  Laterality: Right;    Family History: No family history on file. -Lupus and her maternal aunts  Social History:  Social History   Tobacco Use  . Smoking status: Current Every Day Smoker    Packs/day: 0.12    Years: 15.00    Pack years: 1.80    Types: Cigarettes  . Smokeless tobacco: Never Used  . Tobacco  comment: 4 cigarettes per day  Substance Use Topics  . Alcohol use: Yes    Alcohol/week: 0.0 standard drinks    Comment: Special Occasional takes Vicar  . Drug use: Yes    Frequency: 5.0 times per week    Types: Marijuana    Comment: Marijuana and cocaine used last - Thanksgiving 2020   -Smokes 2 cigarettes a day -Drinks socially on holidays -Smokes marijuana on dialysis days to help with appetite  Review of Systems: A complete ROS was negative except as per HPI.  Review of Systems  Constitutional: Positive for malaise/fatigue. Negative for chills and fever.  HENT: Negative for congestion and sore throat.   Respiratory: Positive for shortness of breath.   Cardiovascular: Negative for chest pain and leg swelling.  Gastrointestinal: Positive for nausea. Negative for abdominal pain, diarrhea and vomiting.  Genitourinary:       Does not make urine  Musculoskeletal: Negative for falls.  Neurological: Negative for loss of consciousness and headaches.   Physical Exam: Blood pressure (!) 138/105, pulse 100, temperature 98.6 F (37 C), temperature source Oral, resp. rate (!) 28, height 5' 4" (1.626 m), weight (S) 47 kg, SpO2 97 %.  Physical  Exam  Constitutional: She is oriented to person, place, and time. No distress.  Chronically ill appearing, thin female  Cardiovascular: Normal rate, regular rhythm and normal heart sounds.  Pulmonary/Chest: Effort normal and breath sounds normal. No respiratory distress. She exhibits no tenderness.  Abdominal: Soft. Bowel sounds are normal. She exhibits no distension. There is no abdominal tenderness.  Musculoskeletal:        General: Normal range of motion.  Neurological: She is alert and oriented to person, place, and time.  Skin: Skin is warm and dry. She is not diaphoretic. No erythema.  LUE AV fistula site without surrounding erythema or palpable abscesses  Psychiatric: Affect normal.  Nursing note and vitals reviewed.  Labs: Results for orders placed or performed during the hospital encounter of 05/18/20 (from the past 24 hour(s))  CBC with Differential     Status: Abnormal   Collection Time: 05/18/20  8:52 AM  Result Value Ref Range   WBC 4.7 4.0 - 10.5 K/uL   RBC 3.29 (L) 3.87 - 5.11 MIL/uL   Hemoglobin 8.9 (L) 12.0 - 15.0 g/dL   HCT 30.0 (L) 36.0 - 46.0 %   MCV 91.2 80.0 - 100.0 fL   MCH 27.1 26.0 - 34.0 pg   MCHC 29.7 (L) 30.0 - 36.0 g/dL   RDW 17.6 (H) 11.5 - 15.5 %   Platelets 194 150 - 400 K/uL   nRBC 0.0 0.0 - 0.2 %   Neutrophils Relative % 68 %   Neutro Abs 3.2 1.7 - 7.7 K/uL   Lymphocytes Relative 18 %   Lymphs Abs 0.8 0.7 - 4.0 K/uL   Monocytes Relative 12 %   Monocytes Absolute 0.6 0.1 - 1.0 K/uL   Eosinophils Relative 2 %   Eosinophils Absolute 0.1 0.0 - 0.5 K/uL   Basophils Relative 0 %   Basophils Absolute 0.0 0.0 - 0.1 K/uL   Immature Granulocytes 0 %   Abs Immature Granulocytes 0.01 0.00 - 0.07 K/uL  Basic metabolic panel     Status: Abnormal   Collection Time: 05/18/20  8:52 AM  Result Value Ref Range   Sodium 139 135 - 145 mmol/L   Potassium 5.2 (H) 3.5 - 5.1 mmol/L   Chloride 93 (L) 98 - 111 mmol/L   CO2 30 22 -  32 mmol/L   Glucose, Bld 83 70  - 99 mg/dL   BUN 65 (H) 6 - 20 mg/dL   Creatinine, Ser 9.61 (H) 0.44 - 1.00 mg/dL   Calcium 9.8 8.9 - 10.3 mg/dL   GFR calc non Af Amer 5 (L) >60 mL/min   GFR calc Af Amer 5 (L) >60 mL/min   Anion gap 16 (H) 5 - 15    EKG: personally reviewed my interpretation is no acute ischemic changes  No results found.   Assessment:  Ms. Achenbach is a 39 yo F w/ a PMHx of ESRD on HD TTS, SLE, chronic systolic congestive heart failure (LVEF 45%, 01/2017) and polysubstance use who presents to the ED via PTAR from dialysis with infiltration of her dialysis fistula.   Plan by Problem:  Active Problems:   AV fistula occlusion [facility])  39 yo F w/ a PMHx of ESRD on HD TTS, SLE, chronic systolic congestive heart failure (LVEF 45%, 01/2017), HTN and polysubstance use who presents to the ED via PTAR from dialysis with infiltration of her dialysis fistula. Of note, an area of her fistula has failed to heal since a plication of an ulcer at that site back in December. Patient does report an episode of pustular discharge last evening and some recent fatigue and nausea. Patient afebrile without a leukocytosis. Vascular evaluated patient and does not believe this to be due to infection and plans for insertion of a tunneled dialysis catheter to allow the left arm to rest.   Plan: -zofran for nausea; monitor for further qt prolongation  -IV vanc  -blood cxs x2 -npo at midnight -CBC in AM -monitor fever curve -dialysis catheter insertion tomorrow with vascular  SLE: -not currently on any DMARDs -previously seen in our clinic with poor follow up  -similar situation with rheumatology -per office note from 2015, pt has never been able to get on a stable lupus regimen due to poor follow up -may be beneficial to monitor her disease activity with CBC, ESR, CRP, anti dsDNA, complement C3 and C4 while inpatient   Plan: -obtain rheum labs above  Chronic Systolic Congestive Heart Failure/HFrEF (LVEF 45%,  01/2017): HTN: -pt w/ hx of hypertension and HFrEF on coreg and dilt outpatient -has not been taking her coreg as she ran out; she probably hasn't taken it in around a month she believes -bp elevated today, most recently 141/109 -denies increased SOB from baseline -denies LE edema -lungs clear and no LE swelling on exam  Plan: -continue home coreg and dilt  Dispo: Admit patient to Observation with expected length of stay less than 2 midnights.  Signed: Al Decant, MD 05/18/2020, 12:47 PM  Pager: [phone]

## 2020-05-18 NOTE — Progress Notes (Signed)
Pharmacy Antibiotic Note  [name] is a 39 y.o. female admitted on 05/18/2020 with cellulitis.  Pharmacy has been consulted for Vancomycin dosing.   Height: 5\' 4"  (162.6 cm) Weight: (S) 47 kg (103 lb 9.9 oz)(dry weight -- today's wt= 48.8kg) IBW/kg (Calculated) : 54.7  Temp (24hrs), Avg:98.6 F (37 C), Min:98.6 F (37 C), Max:98.6 F (37 C)  Recent Labs  Lab 05/18/20 0852  WBC 4.7  CREATININE 9.61*    Estimated Creatinine Clearance: 6 mL/min (A) (by C-G formula based on SCr of 9.61 mg/dL (H)).    Allergies  Allergen Reactions  . Tobramycin Sulfate Swelling    Eye swelling    Antimicrobials this admission: 5/22 Vancomycin >>   Dose adjustments this admission: N/a  Microbiology results: Pending   Plan:  - Vancomycin 1000mg  IV x 1 dose  - Followed by Vancomycin 500mg  IV qTTS after HD - Monitor patients HD sessions and levels as needed  - De-escalate ABX when appropriate   Thank you for allowing pharmacy to be a part of this patient's care.  Duanne Limerick PharmD. BCPS 05/18/2020 11:43 AM

## 2020-05-18 NOTE — Consult Note (Signed)
Hospital Consult    Reason for Consult: Infiltration left arm AV fistula Referring Physician: Dr. Regenia Skeeter MRN #:  [id number]  History of Present Illness: This is a 39 y.o. female with end-stage renal disease on dialysis via left upper arm AV fistula on Tuesday Thursdays and Saturdays.  She has undergone revision of this fistula as recently as December with my partner Dr. Donnetta Hutching.  She states that she has had 1 area of draining she has been keeping a Band-Aid over it.  Earlier today she had infiltration is considerably painful after the needle was placed.  He does report she was sent to the emergency department for further evaluation.  She does not have any ongoing bleeding or expanding hematoma.  Past Medical History:  Diagnosis Date  . Anemia    low iron - receives iron at dialysis  . Chronic systolic congestive heart failure ([place]) 03/16/2016  . Dyspnea   . ESRD (end stage renal disease) ([place])    Hemo TTHSAT _ [place]  . H/O pericarditis 01/17/2013  . H/O pleural effusion 01/17/2013  . Heart murmur   . Lupus (systemic lupus erythematosus) (HCC)    Previously followed with Dr. Charlestine Night, has not followed up recently  . 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 Pneumonia    many times  . Polysubstance abuse ([place])    cocaine, MJ, tobacco  . S/P pericardiocentesis 01/17/2013   H/o pericardial effusion with tamponade 2006   . Seizures ([place])    during pregnancy 1 time  . Streptococcal bacteremia 01/23/2013   She had two S. pneumonae bacteremia on 01/21/2013. Sensitive to Peniccilin     Past Surgical History:  Procedure Laterality Date  . AV FISTULA PLACEMENT    . BASCILIC VEIN TRANSPOSITION Left 02/05/2014   Procedure: [place];  Surgeon: [name], MD;  Location: [place];  Service: Vascular;  Laterality: Left;  .  FISTULA SUPERFICIALIZATION Left 05/30/2018   Procedure: FISTULA PLICATION BASILIC VEIN TRANSPOSITION;  Surgeon: Angelia Mould, MD;  Location: [place];  Service: Vascular;  Laterality: Left;  . FISTULA SUPERFICIALIZATION Left 10/21/8526   Procedure: PLICATION OF LEFT ARTERIOVENOUS FISTULA ULCER;  Surgeon: [name], MD;  Location: [place];  Service: Vascular;  Laterality: Left;  Marland Kitchen VENOGRAM Right 01/31/2014   Procedure: DIALYSIS CATHETER;  Surgeon: Serafina Mitchell, MD;  Location: [facility] CATH LAB;  Service: Cardiovascular;  Laterality: Right;    Allergies  Allergen Reactions  . Tobramycin Sulfate Swelling    Eye swelling    Prior to Admission medications   Medication Sig Start Date End Date Taking? Authorizing Provider  acetaminophen (TYLENOL) 500 MG tablet Take by mouth. 07/20/18  Yes [provider]  B Complex-C-Folic Acid (RENAL VITAMIN) 0.8 MG TABS renal vitamin 10/03/19  Yes [provider]  calcium acetate (PHOSLO) 667 MG capsule Take 1,334-2,668 mg by mouth See admin instructions. Take 4 capsule (2668 mg) by mouth three times a day with each meal and 2 capsules (1334 mg) with each snack   Yes [provider]  carvedilol (COREG) 12.5 MG tablet Take 12.5 mg by mouth 2 (two) times daily. Take one tablet (12.5 mg) by mouth every morning, take one tablet (12.5 mg) at night on Sunday, Monday, Wednesday, Friday 11/07/19  Yes [provider]  Cinacalcet HCl (SENSIPAR PO) Take 1 Dose by mouth daily.  Yes [provider]  darbepoetin (ARANESP) 60 MCG/0.3ML SOLN injection Inject 0.3 mLs (60 mcg total) into the vein every Wednesday with hemodialysis. Patient taking differently: Inject 60 mcg into the vein every Saturday.  02/08/14  Yes Jessee Avers, MD  diltiazem (CARDIZEM) 30 MG tablet Take 1 tablet (30 mg total) by mouth every 6 (six) hours as needed (SVT). Patient taking differently: Take 30 mg by mouth 2 (two) times daily as needed (SVT).  07/19/18   Yes Alphonzo Grieve, MD  diphenhydrAMINE (BENADRYL) 25 MG tablet Take 25-50 mg by mouth See admin instructions. Take one tablet (25 mg) by mouth daily at bedtime, take two tablets (50 mg) immediately prior to dialysis - Tuesday, Thursday, Saturday   Yes [provider]  diphenhydramine-acetaminophen (TYLENOL PM) 25-500 MG TABS tablet Take 0.5-1 tablets by mouth at bedtime as needed (sleep).   Yes [provider]  ferric gluconate 62.5 mg in sodium chloride 0.9 % 100 mL Inject 62.5 mg into the vein every Thursday with hemodialysis. 03/09/16  Yes Rice, Resa Miner, MD  hydroxychloroquine (PLAQUENIL) 200 MG tablet Take 1 tablet (200 mg total) by mouth daily. 07/19/18  Yes Alphonzo Grieve, MD  hydrOXYzine (ATARAX/VISTARIL) 10 MG tablet Take 1 tablet (10 mg total) by mouth 3 (three) times daily as needed for anxiety. Patient taking differently: Take 10 mg by mouth 3 (three) times daily as needed for itching or anxiety.  07/29/19  Yes Caccavale, Sophia, PA-C  iron sucrose in sodium chloride 0.9 % 100 mL Iron Sucrose (Venofer) 01/04/20 12/26/20 Yes [provider]  loperamide (IMODIUM A-D) 2 MG tablet Take 4 mg by mouth See admin instructions. Take two tablest (4 mg) by mouth on dialysis days (Tuesday, Thursday, Saturday)   Yes [provider]  Methoxy PEG-Epoetin Beta (MIRCERA IJ) Inject 1 Dose as directed every 14 (fourteen) days. 03/16/20 03/15/21 Yes [provider]  multivitamin (RENA-VIT) TABS tablet Take 1 tablet by mouth daily. Patient taking differently: Take 1 tablet by mouth at bedtime.  02/18/17  Yes Holley Raring, MD  Nutritional Supplements (NOVASOURCE RENAL) LIQD Take 237 mLs by mouth See admin instructions. Take 1 container (237 mls) by at bedtime on dialysis days (Tuesday, Thursday, Saturday)   Yes [provider]  Darbepoetin Alfa (ARANESP, ALBUMIN FREE, IJ) Darbepoetin Alfa (Aranesp) 01/06/20 01/04/21  [provider]    oxyCODONE-acetaminophen (PERCOCET) 5-325 MG tablet Take 1 tablet by mouth every 6 (six) hours as needed. Patient not taking: Reported on 05/18/2020 12/18/19   Gabriel Earing, PA-C    Social History   Socioeconomic History  . Marital status: Single    Spouse name: Not on file  . Number of children: Not on file  . Years of education: Not on file  . Highest education level: Not on file  Occupational History  . Not on file  Tobacco Use  . Smoking status: Current Every Day Smoker    Packs/day: 0.12    Years: 15.00    Pack years: 1.80    Types: Cigarettes  . Smokeless tobacco: Never Used  . Tobacco comment: 4 cigarettes per day  Substance and Sexual Activity  . Alcohol use: Yes    Alcohol/week: 0.0 standard drinks    Comment: Special Occasional takes Vicar  . Drug use: Yes    Frequency: 5.0 times per week    Types: Marijuana    Comment: Marijuana and cocaine used last - Thanksgiving 2020  . Sexual activity: Not Currently    Birth control/protection:  None  Other Topics Concern  . Not on file  Social History Narrative  . Not on file   Social Determinants of Health   Financial Resource Strain:   . Difficulty of Paying Living Expenses:   Food Insecurity:   . Worried About Charity fundraiser in the Last Year:   . Arboriculturist in the Last Year:   Transportation Needs:   . Film/video editor (Medical):   Marland Kitchen Lack of Transportation (Non-Medical):   Physical Activity:   . Days of Exercise per Week:   . Minutes of Exercise per Session:   Stress:   . Feeling of Stress :   Social Connections:   . Frequency of Communication with Friends and Family:   . Frequency of Social Gatherings with Friends and Family:   . Attends Religious Services:   . Active Member of Clubs or Organizations:   . Attends Archivist Meetings:   Marland Kitchen Marital Status:   Intimate Partner Violence:   . Fear of Current or Ex-Partner:   . Emotionally Abused:   Marland Kitchen Physically Abused:   .  Sexually Abused:     No family history on file.  ROS: Cardiovascular: []  chest pain/pressure []  palpitations []  SOB lying flat []  DOE []  pain in legs while walking []  pain in legs at rest []  pain in legs at night []  non-healing ulcers []  hx of DVT []  swelling in legs  Pulmonary: []  productive cough []  asthma/wheezing []  home O2  Neurologic: []  weakness in []  arms []  legs []  numbness in []  arms []  legs []  hx of CVA []  mini stroke [] difficulty speaking or slurred speech []  temporary loss of vision in one eye []  dizziness  Hematologic: []  hx of cancer []  bleeding problems []  problems with blood clotting easily  Endocrine:   []  diabetes []  thyroid disease  GI []  vomiting blood []  blood in stool  GU: []  CKD/renal failure []  HD--[]  M/W/F or [x]  T/T/S []  burning with urination []  blood in urine  Psychiatric: []  anxiety []  depression  Musculoskeletal: []  arthritis []  joint pain  Integumentary: []  rashes x ulcers  Constitutional: []  fever []  chills   Physical Examination  Vitals:   05/18/20 0804  BP: 130/84  Pulse: 96  Temp: 98.6 F (37 C)  SpO2: 98%   Body mass index is 17.79 kg/m.  General:  nad Gait: Not observed HENT: WNL, normocephalic Pulmonary : non labored Cardiac: Palpable radial pulses bilaterally Abdomen:  soft, NT/ND, no masses Extremities: Left upper arm with 2 areas of hematoma overlying a very strong thrill in her fistula she is quite tender to palpation.  There is 1 punctate area of nonhealing from her previous incision Musculoskeletal: no muscle wasting or atrophy  Neurologic: A&O X 3; Appropriate Affect ; SENSATION: normal; MOTOR FUNCTION:  moving all extremities equally. Speech is fluent/normal   CBC    Component Value Date/Time   WBC 4.7 05/18/2020 0852   RBC 3.29 (L) 05/18/2020 0852   HGB 8.9 (L) 05/18/2020 0852   HCT 30.0 (L) 05/18/2020 0852   PLT 194 05/18/2020 0852   MCV 91.2 05/18/2020 0852   MCH 27.1  05/18/2020 0852   MCHC 29.7 (L) 05/18/2020 0852   RDW 17.6 (H) 05/18/2020 0852   LYMPHSABS 0.8 05/18/2020 0852   MONOABS 0.6 05/18/2020 0852   EOSABS 0.1 05/18/2020 0852   BASOSABS 0.0 05/18/2020 0852    BMET    Component Value Date/Time   NA 139 05/18/2020  0852   K 5.2 (H) 05/18/2020 0852   CL 93 (L) 05/18/2020 0852   CO2 30 05/18/2020 0852   GLUCOSE 83 05/18/2020 0852   BUN 65 (H) 05/18/2020 0852   CREATININE 9.61 (H) 05/18/2020 0852   CALCIUM 9.8 05/18/2020 0852   CALCIUM 6.7 (L) 01/31/2014 1309   GFRNONAA 5 (L) 05/18/2020 0852   GFRAA 5 (L) 05/18/2020 0852    COAGS: Lab Results  Component Value Date   INR 1.14 06/30/2018   INR 1.25 09/26/2015   INR 1.14 01/30/2014     Non-Invasive Vascular Imaging:   No studies   ASSESSMENT/PLAN: This is a 39 y.o. female with end-stage renal disease here with infiltration of the left arm fistula.  There is one area that is failed to heal since her surgery in December.  I do not suspect this is infected however cultures were sent.  Potassium is not elevated she does not need urgent dialysis but will need tunneled dialysis catheter to allow the left arm to rest.  I will plan to place this tomorrow morning in the operating room.  She will need to be n.p.o. past midnight.  Tatyanna Cronk C. Donzetta Matters, MD [facility] Office: [phone] Pager: [phone]

## 2020-05-18 NOTE — ED Triage Notes (Signed)
To ED via [facility] from dialysis center- fistula in left upper arm is swollen, started after Thursday treatment, unable to have treatment today-

## 2020-05-18 NOTE — ED Provider Notes (Signed)
[facility] EMERGENCY DEPARTMENT Provider Note   CSN: [id number] Arrival date & time: 05/18/20  0746     History Chief Complaint  Patient presents with  . dialysis fistula problem    [name] is a 39 y.o. female.  HPI 39 year old female presents with swollen left arm near her fistula. She states that she has seen clear and yellow drainage from the distal site where she had previous procedure by Dr. Donnetta Hutching. This has been ongoing since her last dialysis. It has been swollen near the site. She has not had any fevers. Then this morning she went to go get her dialysis and after they accessed her fistula, she had immediate swelling of the proximal aspect of her arm. Removed the needle and sent her here.   Past Medical History:  Diagnosis Date  . Anemia    low iron - receives iron at dialysis  . Chronic systolic congestive heart failure ([place]) 03/16/2016  . Dyspnea   . ESRD (end stage renal disease) ([place])    Hemo TTHSAT _ [place]  . H/O pericarditis 01/17/2013  . H/O pleural effusion 01/17/2013  . Heart murmur   . Lupus (systemic lupus erythematosus) (HCC)    Previously followed with Dr. Charlestine Night, has not followed up recently  . 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 Pneumonia    many times  . Polysubstance abuse ([place])    cocaine, MJ, tobacco  . S/P pericardiocentesis 01/17/2013   H/o pericardial effusion with tamponade 2006   . Seizures ([place])    during pregnancy 1 time  . Streptococcal bacteremia 01/23/2013   She had two S. pneumonae bacteremia on 01/21/2013. Sensitive to Peniccilin     Patient Active Problem List   Diagnosis Date Noted  . 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 Hyperkalemia 07/29/2018  . SVT (supraventricular tachycardia) ([place]) 07/16/2018  . ESRD (end stage renal disease) on  dialysis ([place]) 02/17/2017  . Chronic systolic congestive heart failure ([place]) 03/16/2016  . Other hypervolemia   . S/P thoracentesis   . Cough with hemoptysis   . Myalgia   . Pulmonary edema   . Rash and nonspecific skin eruption 06/26/2015  . Secondary Raynaud's phenomenon 06/24/2015  . Cramping of hands 04/19/2014  . Unspecified contraceptive management 04/19/2014  . End stage renal disease ([place]) 03/27/2014  . Insomnia 03/15/2014  . Benign hypertension with ESRD (end-stage renal disease) ([place]) 03/15/2014  . Tobacco abuse 02/15/2014  . Healthcare maintenance 02/15/2014  . Hypoalbuminemia 02/01/2014  . Nephrotic syndrome 02/01/2014  . ESRD on dialysis ([place]) 01/31/2014  . Pleural effusion, left 01/31/2014  . Microcytic anemia 01/29/2014  . Hypocalcemia 01/29/2014  . Cocaine abuse ([place]) 01/18/2013  . Marijuana smoker ([place]) 01/18/2013  . H/O pericarditis 01/17/2013  . SLE (systemic lupus erythematosus) ([place]) 01/17/2013  . Lupus nephritis ([place]) 01/17/2013  . S/P pericardiocentesis 01/17/2013  . H/O pleural effusion 01/17/2013  . Nephrosis 01/17/2013  . Preseptal cellulitis 01/17/2013    Past Surgical History:  Procedure Laterality Date  . AV FISTULA PLACEMENT    . BASCILIC VEIN TRANSPOSITION Left 02/05/2014   Procedure: [place];  Surgeon: [name], MD;  Location: [place];  Service: Vascular;  Laterality: Left;  . FISTULA SUPERFICIALIZATION Left 05/30/2018   Procedure: FISTULA PLICATION BASILIC VEIN TRANSPOSITION;  Surgeon: Angelia Mould, MD;  Location: [place];  Service: Vascular;  Laterality: Left;  . FISTULA SUPERFICIALIZATION Left 46/65/9935   Procedure: PLICATION OF LEFT ARTERIOVENOUS FISTULA ULCER;  Surgeon: [name], MD;  Location: [place];  Service: Vascular;  Laterality: Left;  Marland Kitchen VENOGRAM Right 01/31/2014   Procedure: DIALYSIS CATHETER;  Surgeon: Serafina Mitchell, MD;  Location: [facility] CATH LAB;  Service: Cardiovascular;  Laterality: Right;     OB  History   No obstetric history on file.     No family history on file.  Social History   Tobacco Use  . Smoking status: Current Every Day Smoker    Packs/day: 0.12    Years: 15.00    Pack years: 1.80    Types: Cigarettes  . Smokeless tobacco: Never Used  . Tobacco comment: 4 cigarettes per day  Substance Use Topics  . Alcohol use: Yes    Alcohol/week: 0.0 standard drinks    Comment: Special Occasional takes Vicar  . Drug use: Yes    Frequency: 5.0 times per week    Types: Marijuana    Comment: Marijuana and cocaine used last - Thanksgiving 2020    Home Medications Prior to Admission medications   Medication Sig Start Date End Date Taking? Authorizing Provider  acetaminophen (TYLENOL) 500 MG tablet Take by mouth. 07/20/18  Yes [provider]  B Complex-C-Folic Acid (RENAL VITAMIN) 0.8 MG TABS Take 1 tablet by mouth daily.  10/03/19  Yes [provider]  calcium acetate (PHOSLO) 667 MG capsule Take 1,334-2,668 mg by mouth See admin instructions. Take 4 capsule (2668 mg) by mouth three times a day with each meal and 2 capsules (1334 mg) with each snack   Yes [provider]  carvedilol (COREG) 12.5 MG tablet Take 12.5 mg by mouth 2 (two) times daily. Take one tablet (12.5 mg) by mouth every morning, take one tablet (12.5 mg) at night on Sunday, Monday, Wednesday, Friday 11/07/19  Yes [provider]  Cinacalcet HCl (SENSIPAR PO) Take 1 Dose by mouth daily.   Yes [provider]  darbepoetin (ARANESP) 60 MCG/0.3ML SOLN injection Inject 0.3 mLs (60 mcg total) into the vein every Wednesday with hemodialysis. Patient taking differently: Inject 60 mcg into the vein every Saturday.  02/08/14  Yes Jessee Avers, MD  diltiazem (CARDIZEM) 30 MG tablet Take 1 tablet (30 mg total) by mouth every 6 (six) hours as needed (SVT). Patient taking differently: Take 30 mg by mouth 2 (two) times daily as needed (SVT).  07/19/18  Yes Alphonzo Grieve, MD    diphenhydrAMINE (BENADRYL) 25 MG tablet Take 25-50 mg by mouth See admin instructions. Take one tablet (25 mg) by mouth daily at bedtime, take two tablets (50 mg) immediately prior to dialysis - Tuesday, Thursday, Saturday   Yes [provider]  diphenhydramine-acetaminophen (TYLENOL PM) 25-500 MG TABS tablet Take 0.5-1 tablets by mouth at bedtime as needed (sleep).   Yes [provider]  ferric gluconate 62.5 mg in sodium chloride 0.9 % 100 mL Inject 62.5 mg into the vein every Thursday with hemodialysis. 03/09/16  Yes Rice, Resa Miner, MD  hydroxychloroquine (PLAQUENIL) 200 MG tablet Take 1 tablet (200 mg total) by mouth daily. 07/19/18  Yes Alphonzo Grieve, MD  hydrOXYzine (ATARAX/VISTARIL) 10 MG tablet Take 1 tablet (10 mg total) by mouth 3 (three) times daily as needed for anxiety. Patient taking differently: Take 10 mg by mouth 3 (three) times daily as needed for itching or anxiety.  07/29/19  Yes Caccavale, Sophia, PA-C  iron sucrose in  sodium chloride 0.9 % 100 mL Iron Sucrose (Venofer) 01/04/20 12/26/20 Yes [provider]  loperamide (IMODIUM A-D) 2 MG tablet Take 4 mg by mouth See admin instructions. Take two tablest (4 mg) by mouth on dialysis days (Tuesday, Thursday, Saturday)   Yes [provider]  Methoxy PEG-Epoetin Beta (MIRCERA IJ) Inject 1 Dose as directed every 14 (fourteen) days. 03/16/20 03/15/21 Yes [provider]  multivitamin (RENA-VIT) TABS tablet Take 1 tablet by mouth daily. Patient taking differently: Take 1 tablet by mouth at bedtime.  02/18/17  Yes Holley Raring, MD  Nutritional Supplements (NOVASOURCE RENAL) LIQD Take 237 mLs by mouth See admin instructions. Take 1 container (237 mls) by at bedtime on dialysis days (Tuesday, Thursday, Saturday)   Yes [provider]  oxyCODONE-acetaminophen (PERCOCET) 5-325 MG tablet Take 1 tablet by mouth every 6 (six) hours as needed. Patient not taking: Reported on 05/18/2020 12/18/19    Gabriel Earing, PA-C    Allergies    Tobramycin sulfate  Review of Systems   Review of Systems  Constitutional: Negative for fever.  Respiratory: Negative for shortness of breath.   Musculoskeletal: Positive for myalgias.  Skin: Positive for wound.  All other systems reviewed and are negative.   Physical Exam Updated Vital Signs BP (!) 138/105   Pulse 100   Temp 98.6 F (37 C) (Oral)   Resp (!) 28   Ht 5\' 4"  (1.626 m)   Wt (S) 47 kg Comment: dry weight -- today's wt= 48.8kg  SpO2 97%   BMI 17.79 kg/m   Physical Exam Vitals and nursing note reviewed.  Constitutional:      General: She is not in acute distress.    Appearance: She is well-developed. She is not ill-appearing or diaphoretic.  HENT:     Head: Normocephalic and atraumatic.     Right Ear: External ear normal.     Left Ear: External ear normal.     Nose: Nose normal.  Eyes:     General:        Right eye: No discharge.        Left eye: No discharge.  Cardiovascular:     Rate and Rhythm: Normal rate and regular rhythm.     Heart sounds: Normal heart sounds.  Pulmonary:     Effort: Pulmonary effort is normal.     Breath sounds: Normal breath sounds.  Abdominal:     Palpations: Abdomen is soft.     Tenderness: There is no abdominal tenderness.  Musculoskeletal:     Comments: Left upper arm has fistula. There is a thrill to the distal 1/2, but not proxima. There is a small abrasion/wound distally, no acute drainage. Mild radial swelling noted distally  Skin:    General: Skin is warm and dry.  Neurological:     Mental Status: She is alert.  Psychiatric:        Mood and Affect: Mood is not anxious.     ED Results / Procedures / Treatments   Labs (all labs ordered are listed, but only abnormal results are displayed) Labs Reviewed  CBC WITH DIFFERENTIAL/PLATELET - Abnormal; Notable for the following components:      Result Value   RBC 3.29 (*)    Hemoglobin 8.9 (*)    HCT 30.0 (*)    MCHC 29.7  (*)    RDW 17.6 (*)    All other components within normal limits  BASIC METABOLIC PANEL - Abnormal; Notable for the following components:  Potassium 5.2 (*)    Chloride 93 (*)    BUN 65 (*)    Creatinine, Ser 9.61 (*)    GFR calc non Af Amer 5 (*)    GFR calc Af Amer 5 (*)    Anion gap 16 (*)    All other components within normal limits  CULTURE, BLOOD (ROUTINE X 2)  CULTURE, BLOOD (ROUTINE X 2)  SARS CORONAVIRUS 2 BY RT PCR (HOSPITAL ORDER, [facility])  HIV ANTIBODY (ROUTINE TESTING W REFLEX)  SEDIMENTATION RATE  C-REACTIVE PROTEIN  ANTI-DNA ANTIBODY, DOUBLE-STRANDED  C3 COMPLEMENT  C4 COMPLEMENT    EKG EKG Interpretation  Date/Time:  Saturday May 18 2020 11:33:50 EDT Ventricular Rate:  99 PR Interval:    QRS Duration: 87 QT Interval:  439 QTC Calculation: 564 R Axis:   92 Text Interpretation: Sinus rhythm Borderline right axis deviation Left ventricular hypertrophy Nonspecific T abnrm, anterolateral leads Prolonged QT interval similar to Aug 2020 Confirmed by Sherwood Gambler [phone]) on 05/18/2020 11:49:07 AM   Radiology No results found.  Procedures Procedures (including critical care time)  Medications Ordered in ED Medications  cefUROXime (ZINACEF) 1.5 g in sodium chloride 0.9 % 100 mL IVPB (has no administration in time range)  vancomycin (VANCOCIN) IVPB 1000 mg/200 mL premix (0 mg Intravenous Stopped 05/18/20 1232)    Followed by  vancomycin (VANCOREADY) IVPB 500 mg/100 mL (has no administration in time range)  diltiazem (CARDIZEM) tablet 30 mg (has no administration in time range)  carvedilol (COREG) tablet 12.5 mg (12.5 mg Oral Given 05/18/20 1152)  hydrOXYzine (ATARAX/VISTARIL) tablet 10 mg (has no administration in time range)  cinacalcet (SENSIPAR) tablet 30 mg (30 mg Oral Given 05/18/20 1237)  heparin injection 5,000 Units (has no administration in time range)  acetaminophen (TYLENOL) tablet 650 mg (has no administration in time  range)    Or  acetaminophen (TYLENOL) suppository 650 mg (has no administration in time range)  ondansetron (ZOFRAN) tablet 4 mg (has no administration in time range)    Or  ondansetron (ZOFRAN) injection 4 mg (has no administration in time range)  ondansetron (ZOFRAN) injection 4 mg (4 mg Intravenous Given 05/18/20 1111)    ED Course  I have reviewed the triage vital signs and the nursing notes.  Pertinent labs & imaging results that were available during my care of the patient were reviewed by me and considered in my medical decision making (see chart for details).    MDM Rules/Calculators/A&P                      Dr. Donzetta Matters has seen patient.  He advises that this fistula will need rest and he will place a tunneled catheter tomorrow on 5/23.  N.p.o. after midnight.  Advises IV antibiotics as not really sure if this is infected at this point.  Asked for medical admission.  She does have some minimal hyperkalemia at 5.2 but does not appear to need emergent dialysis. Final Clinical Impression(s) / ED Diagnoses Final diagnoses:  Malfunction of arteriovenous dialysis fistula, initial encounter [facility])    Rx / DC Orders ED Discharge Orders    None       Sherwood Gambler, MD 05/18/20 1259

## 2020-05-18 NOTE — ED Notes (Signed)
Walked patient to the bathroom and gave patient cup of ice

## 2020-05-18 NOTE — ED Notes (Signed)
Gave patient a ginger ale patient is resting with call bell in reach 

## 2020-05-18 NOTE — ED Notes (Signed)
Pt attempted to move to green zone, HR found to be in 170's. EKG shot, MD paged, vagal maneuver attempted. Pt consistently in 170's. Pt moved back to Blue 35, HR found to be 90 bpm, Re-evaluated on transport monitor, HR 90. NAD noted from  Patient.

## 2020-05-19 ENCOUNTER — Observation Stay (HOSPITAL_COMMUNITY): Payer: Medicaid Other | Admitting: Certified Registered"

## 2020-05-19 ENCOUNTER — Observation Stay (HOSPITAL_COMMUNITY): Payer: Medicaid Other

## 2020-05-19 ENCOUNTER — Encounter (HOSPITAL_COMMUNITY)
Admission: EM | Disposition: A | Discharge status: Home / Self Care | Payer: Self-pay | Source: Home / Self Care | Attending: Internal Medicine

## 2020-05-19 DIAGNOSIS — F17210 Nicotine dependence, cigarettes, uncomplicated: Secondary | ICD-10-CM | POA: Diagnosis present

## 2020-05-19 DIAGNOSIS — R188 Other ascites: Secondary | ICD-10-CM | POA: Diagnosis present

## 2020-05-19 DIAGNOSIS — Z79899 Other long term (current) drug therapy: Secondary | ICD-10-CM | POA: Diagnosis not present

## 2020-05-19 DIAGNOSIS — R569 Unspecified convulsions: Secondary | ICD-10-CM | POA: Diagnosis present

## 2020-05-19 DIAGNOSIS — N186 End stage renal disease: Secondary | ICD-10-CM | POA: Diagnosis present

## 2020-05-19 DIAGNOSIS — D649 Anemia, unspecified: Secondary | ICD-10-CM | POA: Diagnosis present

## 2020-05-19 DIAGNOSIS — Z419 Encounter for procedure for purposes other than remedying health state, unspecified: Secondary | ICD-10-CM | POA: Diagnosis not present

## 2020-05-19 DIAGNOSIS — I5022 Chronic systolic (congestive) heart failure: Secondary | ICD-10-CM | POA: Diagnosis present

## 2020-05-19 DIAGNOSIS — R7401 Elevation of levels of liver transaminase levels: Secondary | ICD-10-CM | POA: Diagnosis present

## 2020-05-19 DIAGNOSIS — Z20822 Contact with and (suspected) exposure to covid-19: Secondary | ICD-10-CM | POA: Diagnosis present

## 2020-05-19 DIAGNOSIS — E875 Hyperkalemia: Secondary | ICD-10-CM | POA: Diagnosis present

## 2020-05-19 DIAGNOSIS — Y832 Surgical operation with anastomosis, bypass or graft as the cause of abnormal reaction of the patient, or of later complication, without mention of misadventure at the time of the procedure: Secondary | ICD-10-CM | POA: Diagnosis present

## 2020-05-19 DIAGNOSIS — T82898A Other specified complication of vascular prosthetic devices, implants and grafts, initial encounter: Secondary | ICD-10-CM | POA: Diagnosis present

## 2020-05-19 DIAGNOSIS — M3214 Glomerular disease in systemic lupus erythematosus: Secondary | ICD-10-CM | POA: Diagnosis present

## 2020-05-19 DIAGNOSIS — F1990 Other psychoactive substance use, unspecified, uncomplicated: Secondary | ICD-10-CM | POA: Diagnosis present

## 2020-05-19 DIAGNOSIS — N2581 Secondary hyperparathyroidism of renal origin: Secondary | ICD-10-CM | POA: Diagnosis present

## 2020-05-19 DIAGNOSIS — I132 Hypertensive heart and chronic kidney disease with heart failure and with stage 5 chronic kidney disease, or end stage renal disease: Secondary | ICD-10-CM | POA: Diagnosis present

## 2020-05-19 DIAGNOSIS — Z992 Dependence on renal dialysis: Secondary | ICD-10-CM | POA: Diagnosis not present

## 2020-05-19 DIAGNOSIS — Z888 Allergy status to other drugs, medicaments and biological substances status: Secondary | ICD-10-CM | POA: Diagnosis not present

## 2020-05-19 DIAGNOSIS — E8889 Other specified metabolic disorders: Secondary | ICD-10-CM | POA: Diagnosis present

## 2020-05-19 HISTORY — PX: INSERTION OF DIALYSIS CATHETER: SHX1324

## 2020-05-19 LAB — COMPREHENSIVE METABOLIC PANEL
ALT: 187 U/L — ABNORMAL HIGH (ref 0–44)
ALT: 180 U/L — ABNORMAL HIGH (ref 0–44)
AST: 231 U/L — ABNORMAL HIGH (ref 15–41)
AST: 188 U/L — ABNORMAL HIGH (ref 15–41)
Albumin: 2.3 g/dL — ABNORMAL LOW (ref 3.5–5.0)
Albumin: 2.5 g/dL — ABNORMAL LOW (ref 3.5–5.0)
Alkaline Phosphatase: 261 U/L — ABNORMAL HIGH (ref 38–126)
Alkaline Phosphatase: 278 U/L — ABNORMAL HIGH (ref 38–126)
Anion gap: 16 — ABNORMAL HIGH (ref 5–15)
Anion gap: 16 — ABNORMAL HIGH (ref 5–15)
BUN: 84 mg/dL — ABNORMAL HIGH (ref 6–20)
BUN: 85 mg/dL — ABNORMAL HIGH (ref 6–20)
CO2: 24 mmol/L (ref 22–32)
CO2: 26 mmol/L (ref 22–32)
Calcium: 8.5 mg/dL — ABNORMAL LOW (ref 8.9–10.3)
Calcium: 8.6 mg/dL — ABNORMAL LOW (ref 8.9–10.3)
Chloride: 94 mmol/L — ABNORMAL LOW (ref 98–111)
Chloride: 93 mmol/L — ABNORMAL LOW (ref 98–111)
Creatinine, Ser: 11.3 mg/dL — ABNORMAL HIGH (ref 0.44–1.00)
Creatinine, Ser: 11.43 mg/dL — ABNORMAL HIGH (ref 0.44–1.00)
GFR calc Af Amer: 4 mL/min — ABNORMAL LOW (ref >60)
GFR calc Af Amer: 4 mL/min — ABNORMAL LOW (ref >60)
GFR calc non Af Amer: 4 mL/min — ABNORMAL LOW (ref >60)
GFR calc non Af Amer: 4 mL/min — ABNORMAL LOW (ref >60)
Glucose, Bld: 92 mg/dL (ref 70–99)
Glucose, Bld: 95 mg/dL (ref 70–99)
Potassium: 7.3 mmol/L (ref 3.5–5.1)
Potassium: 6.4 mmol/L (ref 3.5–5.1)
Sodium: 134 mmol/L — ABNORMAL LOW (ref 135–145)
Sodium: 135 mmol/L (ref 135–145)
Total Bilirubin: 1.1 mg/dL (ref 0.3–1.2)
Total Bilirubin: 1.2 mg/dL (ref 0.3–1.2)
Total Protein: 7.5 g/dL (ref 6.5–8.1)
Total Protein: 8.5 g/dL — ABNORMAL HIGH (ref 6.5–8.1)

## 2020-05-19 LAB — C3 COMPLEMENT: C3 Complement: 58 mg/dL — ABNORMAL LOW (ref 82–167)

## 2020-05-19 LAB — HEPATITIS A ANTIBODY, IGM: Hep A IgM: NONREACTIVE

## 2020-05-19 LAB — POCT I-STAT, CHEM 8
BUN: 80 mg/dL — ABNORMAL HIGH (ref 6–20)
Calcium, Ion: 1.06 mmol/L — ABNORMAL LOW (ref 1.15–1.40)
Chloride: 94 mmol/L — ABNORMAL LOW (ref 98–111)
Creatinine, Ser: 12.7 mg/dL — ABNORMAL HIGH (ref 0.44–1.00)
Glucose, Bld: 171 mg/dL — ABNORMAL HIGH (ref 70–99)
HCT: 29 % — ABNORMAL LOW (ref 36.0–46.0)
Hemoglobin: 9.9 g/dL — ABNORMAL LOW (ref 12.0–15.0)
Potassium: 6.2 mmol/L — ABNORMAL HIGH (ref 3.5–5.1)
Sodium: 132 mmol/L — ABNORMAL LOW (ref 135–145)
TCO2: 28 mmol/L (ref 22–32)

## 2020-05-19 LAB — CBC
HCT: 31.1 % — ABNORMAL LOW (ref 36.0–46.0)
Hemoglobin: 9.5 g/dL — ABNORMAL LOW (ref 12.0–15.0)
MCH: 27 pg (ref 26.0–34.0)
MCHC: 30.5 g/dL (ref 30.0–36.0)
MCV: 88.4 fL (ref 80.0–100.0)
Platelets: 179 10*3/uL (ref 150–400)
RBC: 3.52 MIL/uL — ABNORMAL LOW (ref 3.87–5.11)
RDW: 17.3 % — ABNORMAL HIGH (ref 11.5–15.5)
WBC: 3.1 10*3/uL — ABNORMAL LOW (ref 4.0–10.5)
nRBC: 0 % (ref 0.0–0.2)

## 2020-05-19 LAB — C4 COMPLEMENT: Complement C4, Body Fluid: 31 mg/dL (ref 12–38)

## 2020-05-19 LAB — BASIC METABOLIC PANEL
Anion gap: 15 (ref 5–15)
BUN: 84 mg/dL — ABNORMAL HIGH (ref 6–20)
CO2: 26 mmol/L (ref 22–32)
Calcium: 8.4 mg/dL — ABNORMAL LOW (ref 8.9–10.3)
Chloride: 93 mmol/L — ABNORMAL LOW (ref 98–111)
Creatinine, Ser: 11.01 mg/dL — ABNORMAL HIGH (ref 0.44–1.00)
GFR calc Af Amer: 5 mL/min — ABNORMAL LOW (ref >60)
GFR calc non Af Amer: 4 mL/min — ABNORMAL LOW (ref >60)
Glucose, Bld: 174 mg/dL — ABNORMAL HIGH (ref 70–99)
Potassium: 6.5 mmol/L (ref 3.5–5.1)
Sodium: 134 mmol/L — ABNORMAL LOW (ref 135–145)

## 2020-05-19 LAB — GLUCOSE, CAPILLARY
Glucose-Capillary: 82 mg/dL (ref 70–99)
Glucose-Capillary: 27 mg/dL — CL (ref 70–99)
Glucose-Capillary: 139 mg/dL — ABNORMAL HIGH (ref 70–99)

## 2020-05-19 LAB — HEPATITIS B SURFACE ANTIGEN: Hepatitis B Surface Ag: NONREACTIVE

## 2020-05-19 LAB — POTASSIUM
Potassium: 3.8 mmol/L (ref 3.5–5.1)
Potassium: 4.6 mmol/L (ref 3.5–5.1)

## 2020-05-19 LAB — HEPATITIS B CORE ANTIBODY, IGM: Hep B C IgM: NONREACTIVE

## 2020-05-19 LAB — HEPATITIS C ANTIBODY: HCV Ab: NONREACTIVE

## 2020-05-19 SURGERY — INSERTION OF DIALYSIS CATHETER
Anesthesia: General

## 2020-05-19 MED ORDER — LIDOCAINE 2% (20 MG/ML) 5 ML SYRINGE
INTRAMUSCULAR | Status: AC
  Filled 2020-05-19: qty 5

## 2020-05-19 MED ORDER — ROCURONIUM BROMIDE 10 MG/ML (PF) SYRINGE
PREFILLED_SYRINGE | INTRAVENOUS | Status: AC
  Filled 2020-05-19: qty 10

## 2020-05-19 MED ORDER — INSULIN ASPART 100 UNIT/ML IV SOLN: 5.0000 [IU] | Freq: Once | INTRAVENOUS | Status: DC

## 2020-05-19 MED ORDER — SODIUM CHLORIDE 0.9 % IV SOLN: INTRAVENOUS | Status: DC | PRN

## 2020-05-19 MED ORDER — ALBUTEROL SULFATE (2.5 MG/3ML) 0.083% IN NEBU
INHALATION_SOLUTION | RESPIRATORY_TRACT | Status: AC
  Filled 2020-05-19: qty 3

## 2020-05-19 MED ORDER — DEXAMETHASONE SODIUM PHOSPHATE 10 MG/ML IJ SOLN
INTRAMUSCULAR | Status: AC
  Filled 2020-05-19: qty 1

## 2020-05-19 MED ORDER — CEFAZOLIN SODIUM 1 G IJ SOLR
INTRAMUSCULAR | Status: AC
  Filled 2020-05-19: qty 10

## 2020-05-19 MED ORDER — CEFAZOLIN SODIUM-DEXTROSE 1-4 GM/50ML-% IV SOLN
INTRAVENOUS | Status: DC | PRN
  Administered 2020-05-19: 1 g via INTRAVENOUS

## 2020-05-19 MED ORDER — HYDROMORPHONE HCL 2 MG PO TABS
2.0000 mg | ORAL_TABLET | ORAL | Status: DC | PRN
  Administered 2020-05-19 – 2020-05-20 (×3): 2 mg via ORAL
  Filled 2020-05-19 (×3): qty 1

## 2020-05-19 MED ORDER — ONDANSETRON HCL 4 MG/2ML IJ SOLN
INTRAMUSCULAR | Status: AC
  Filled 2020-05-19: qty 2

## 2020-05-19 MED ORDER — FENTANYL CITRATE (PF) 100 MCG/2ML IJ SOLN
INTRAMUSCULAR | Status: DC | PRN
  Administered 2020-05-19: 50 ug via INTRAVENOUS

## 2020-05-19 MED ORDER — DEXTROSE 50 % IV SOLN
INTRAVENOUS | Status: AC
  Filled 2020-05-19: qty 50

## 2020-05-19 MED ORDER — SODIUM ZIRCONIUM CYCLOSILICATE 10 G PO PACK
10.0000 g | PACK | Freq: Once | ORAL | Status: DC
  Filled 2020-05-19: qty 1

## 2020-05-19 MED ORDER — CHLORHEXIDINE GLUCONATE CLOTH 2 % EX PADS
6.0000 | MEDICATED_PAD | Freq: Every day | CUTANEOUS | Status: DC
  Administered 2020-05-19: 6 via TOPICAL

## 2020-05-19 MED ORDER — DEXTROSE-NACL 5-0.45 % IV SOLN: INTRAVENOUS | Status: DC

## 2020-05-19 MED ORDER — INSULIN ASPART 100 UNIT/ML ~~LOC~~ SOLN
SUBCUTANEOUS | Status: AC
  Filled 2020-05-19: qty 1

## 2020-05-19 MED ORDER — SODIUM CHLORIDE 0.9 % IV SOLN
INTRAVENOUS | Status: DC | PRN
  Administered 2020-05-19: 500 mL

## 2020-05-19 MED ORDER — LIDOCAINE HCL (PF) 1 % IJ SOLN
INTRAMUSCULAR | Status: AC
  Filled 2020-05-19: qty 30

## 2020-05-19 MED ORDER — DEXAMETHASONE SODIUM PHOSPHATE 10 MG/ML IJ SOLN
INTRAMUSCULAR | Status: DC | PRN
  Administered 2020-05-19: 4 mg via INTRAVENOUS

## 2020-05-19 MED ORDER — PHENYLEPHRINE HCL-NACL 10-0.9 MG/250ML-% IV SOLN
INTRAVENOUS | Status: DC | PRN
Start: 2020-05-19 — End: 2020-05-19
  Administered 2020-05-19: 50 ug/min via INTRAVENOUS

## 2020-05-19 MED ORDER — LIDOCAINE HCL (CARDIAC) PF 100 MG/5ML IV SOSY
PREFILLED_SYRINGE | INTRAVENOUS | Status: DC | PRN
  Administered 2020-05-19: 20 mg via INTRAVENOUS

## 2020-05-19 MED ORDER — PHENYLEPHRINE 40 MCG/ML (10ML) SYRINGE FOR IV PUSH (FOR BLOOD PRESSURE SUPPORT)
PREFILLED_SYRINGE | INTRAVENOUS | Status: DC | PRN
  Administered 2020-05-19 (×2): 80 ug via INTRAVENOUS
  Administered 2020-05-19: 40 ug via INTRAVENOUS

## 2020-05-19 MED ORDER — 0.9 % SODIUM CHLORIDE (POUR BTL) OPTIME
TOPICAL | Status: DC | PRN
  Administered 2020-05-19: 1000 mL

## 2020-05-19 MED ORDER — SODIUM CHLORIDE (PF) 0.9 % IJ SOLN
INTRAMUSCULAR | Status: AC
  Filled 2020-05-19: qty 10

## 2020-05-19 MED ORDER — PROPOFOL 10 MG/ML IV BOLUS
INTRAVENOUS | Status: DC | PRN
  Administered 2020-05-19: 110 mg via INTRAVENOUS

## 2020-05-19 MED ORDER — SODIUM CHLORIDE 0.9 % IV SOLN
INTRAVENOUS | Status: AC
  Filled 2020-05-19: qty 1.2

## 2020-05-19 MED ORDER — INSULIN ASPART 100 UNIT/ML ~~LOC~~ SOLN
SUBCUTANEOUS | Status: DC | PRN
Start: 2020-05-19 — End: 2020-05-19
  Administered 2020-05-19: 10 [IU] via SUBCUTANEOUS

## 2020-05-19 MED ORDER — BISMUTH SUBSALICYLATE 262 MG/15ML PO SUSP
30.0000 mL | Freq: Once | ORAL | Status: AC
  Administered 2020-05-19: 30 mL via ORAL
  Filled 2020-05-19: qty 236

## 2020-05-19 MED ORDER — MIDAZOLAM HCL 2 MG/2ML IJ SOLN
INTRAMUSCULAR | Status: AC
  Filled 2020-05-19: qty 2

## 2020-05-19 MED ORDER — VANCOMYCIN HCL 500 MG/100ML IV SOLN
500.0000 mg | Freq: Once | INTRAVENOUS | Status: DC
  Filled 2020-05-19: qty 100

## 2020-05-19 MED ORDER — PROPOFOL 10 MG/ML IV BOLUS
INTRAVENOUS | Status: AC
  Filled 2020-05-19: qty 20

## 2020-05-19 MED ORDER — SODIUM CHLORIDE 0.9 % IV SOLN
1.0000 g | Freq: Once | INTRAVENOUS | Status: DC
Start: 2020-05-19 — End: 2020-05-19

## 2020-05-19 MED ORDER — DEXTROSE 50 % IV SOLN
1.0000 | Freq: Once | INTRAVENOUS | Status: AC
  Administered 2020-05-19: 1 via INTRAVENOUS

## 2020-05-19 MED ORDER — FENTANYL CITRATE (PF) 250 MCG/5ML IJ SOLN
INTRAMUSCULAR | Status: AC
  Filled 2020-05-19: qty 5

## 2020-05-19 MED ORDER — HEPARIN SODIUM (PORCINE) 1000 UNIT/ML IJ SOLN
INTRAMUSCULAR | Status: AC
  Filled 2020-05-19: qty 1

## 2020-05-19 MED ORDER — MUPIROCIN 2 % EX OINT
1.0000 "application " | TOPICAL_OINTMENT | Freq: Two times a day (BID) | CUTANEOUS | Status: DC
  Administered 2020-05-19 – 2020-05-20 (×3): 1 via NASAL
  Filled 2020-05-19: qty 22

## 2020-05-19 MED ORDER — HEPARIN SODIUM (PORCINE) 1000 UNIT/ML IJ SOLN
INTRAMUSCULAR | Status: DC | PRN
  Administered 2020-05-19: 10000 [IU]

## 2020-05-19 MED ORDER — VANCOMYCIN HCL 500 MG/100ML IV SOLN
500.0000 mg | Freq: Once | INTRAVENOUS | Status: AC
  Administered 2020-05-19: 500 mg via INTRAVENOUS
  Filled 2020-05-19 (×2): qty 100

## 2020-05-19 MED ORDER — ALBUTEROL SULFATE (2.5 MG/3ML) 0.083% IN NEBU: 2.5000 mg | INHALATION_SOLUTION | RESPIRATORY_TRACT | Status: DC | PRN

## 2020-05-19 MED ORDER — CALCIUM GLUCONATE-NACL 1-0.675 GM/50ML-% IV SOLN
1.0000 g | Freq: Once | INTRAVENOUS | Status: AC
  Administered 2020-05-19: 1000 mg via INTRAVENOUS
  Filled 2020-05-19: qty 50

## 2020-05-19 MED ORDER — DEXTROSE 50 % IV SOLN
1.0000 | Freq: Once | INTRAVENOUS | Status: AC
  Administered 2020-05-19: 50 mL via INTRAVENOUS

## 2020-05-19 MED ORDER — MIDAZOLAM HCL 5 MG/5ML IJ SOLN
INTRAMUSCULAR | Status: DC | PRN
  Administered 2020-05-19: 1 mg via INTRAVENOUS

## 2020-05-19 SURGICAL SUPPLY — 41 items
ADH SKN CLS APL DERMABOND .7 (GAUZE/BANDAGES/DRESSINGS) ×1
BAG DECANTER FOR FLEXI CONT (MISCELLANEOUS) ×3 IMPLANT
BIOPATCH RED 1 DISK 7.0 (GAUZE/BANDAGES/DRESSINGS) ×2 IMPLANT
BIOPATCH RED 1IN DISK 7.0MM (GAUZE/BANDAGES/DRESSINGS) ×1
CATH PALINDROME-P 19CM W/VT (CATHETERS) ×2 IMPLANT
CATH PALINDROME-P 23CM W/VT (CATHETERS) IMPLANT
CATH PALINDROME-P 28CM W/VT (CATHETERS) IMPLANT
COVER PROBE W GEL 5X96 (DRAPES) ×3 IMPLANT
COVER SURGICAL LIGHT HANDLE (MISCELLANEOUS) ×3 IMPLANT
COVER WAND RF STERILE (DRAPES) ×3 IMPLANT
DERMABOND ADVANCED (GAUZE/BANDAGES/DRESSINGS) ×2
DERMABOND ADVANCED .7 DNX12 (GAUZE/BANDAGES/DRESSINGS) ×1 IMPLANT
DRAPE C-ARM 42X72 X-RAY (DRAPES) ×3 IMPLANT
DRAPE CHEST BREAST 15X10 FENES (DRAPES) ×3 IMPLANT
GAUZE 4X4 16PLY RFD (DISPOSABLE) ×3 IMPLANT
GLOVE BIO SURGEON STRL SZ7.5 (GLOVE) ×3 IMPLANT
GOWN STRL REUS W/ TWL LRG LVL3 (GOWN DISPOSABLE) ×1 IMPLANT
GOWN STRL REUS W/ TWL XL LVL3 (GOWN DISPOSABLE) ×1 IMPLANT
GOWN STRL REUS W/TWL LRG LVL3 (GOWN DISPOSABLE) ×6
GOWN STRL REUS W/TWL XL LVL3 (GOWN DISPOSABLE) ×3
KIT BASIN OR (CUSTOM PROCEDURE TRAY) ×3 IMPLANT
KIT PALINDROME-P 55CM (CATHETERS) IMPLANT
KIT TURNOVER KIT B (KITS) ×3 IMPLANT
NDL 18GX1X1/2 (RX/OR ONLY) (NEEDLE) ×1 IMPLANT
NDL HYPO 25GX1X1/2 BEV (NEEDLE) ×1 IMPLANT
NEEDLE 18GX1X1/2 (RX/OR ONLY) (NEEDLE) ×3 IMPLANT
NEEDLE HYPO 25GX1X1/2 BEV (NEEDLE) IMPLANT
NS IRRIG 1000ML POUR BTL (IV SOLUTION) ×3 IMPLANT
PACK SURGICAL SETUP 50X90 (CUSTOM PROCEDURE TRAY) ×3 IMPLANT
PAD ARMBOARD 7.5X6 YLW CONV (MISCELLANEOUS) ×6 IMPLANT
SET MICROPUNCTURE 5F STIFF (MISCELLANEOUS) ×2 IMPLANT
SOAP 2 % CHG 4 OZ (WOUND CARE) ×3 IMPLANT
SUT ETHILON 3 0 PS 1 (SUTURE) ×3 IMPLANT
SUT MNCRL AB 4-0 PS2 18 (SUTURE) ×3 IMPLANT
SYR 10ML LL (SYRINGE) ×3 IMPLANT
SYR 20ML LL LF (SYRINGE) ×4 IMPLANT
SYR 5ML LL (SYRINGE) ×3 IMPLANT
SYR CONTROL 10ML LL (SYRINGE) ×1 IMPLANT
TOWEL GREEN STERILE (TOWEL DISPOSABLE) ×3 IMPLANT
TOWEL GREEN STERILE FF (TOWEL DISPOSABLE) ×6 IMPLANT
WATER STERILE IRR 1000ML POUR (IV SOLUTION) ×3 IMPLANT

## 2020-05-19 NOTE — Progress Notes (Signed)
Pharmacy Antibiotic Note  [name] is a 39 y.o. female admitted on 05/18/2020 with cellulitis at AV fistula site.  Pharmacy has been consulted for Vancomycin dosing.  Patient is on dialysis TTS but cuts most sessions short. Noted low weight. Nephrology planning off-schedule HD on 5/23 for hyperkalemia   Plan:  - Vancomycin x1 on 5/23 after dialysis with instructions to not give if patient is not dialyzed  - Vancomycin 500mg  IV qTTS after HD - Monitor HD sessions and levels as needed  - Monitor cultures, clinical status, narrow abx as able and f/u duration    Height: 5\' 4"  (162.6 cm) Weight: 48.8 kg (107 lb 9.4 oz) IBW/kg (Calculated) : 54.7  Temp (24hrs), Avg:98.2 F (36.8 C), Min:97.2 F (36.2 C), Max:99.7 F (37.6 C)  Recent Labs  Lab 05/18/20 0852 05/19/20 0512 05/19/20 0752 05/19/20 0817 05/19/20 1130  WBC 4.7 3.1*  --   --   --   CREATININE 9.61* 11.30* 12.70* 11.01* 11.43*    Estimated Creatinine Clearance: 5.2 mL/min (A) (by C-G formula based on SCr of 11.43 mg/dL (H)).    Allergies  Allergen Reactions  . Tobramycin Sulfate Swelling    Eye swelling    Antimicrobials this admission: 5/22 Vancomycin >>    Microbiology results: 5/22 MRSA +  5/22 BCx ngtd   Thank you for allowing pharmacy to be a part of this patient's care.  Benetta Spar, PharmD, BCPS, BCCP Clinical Pharmacist  Please check AMION for all [place] phone numbers After 10:00 PM, call [place] [phone]

## 2020-05-19 NOTE — Progress Notes (Signed)
CRITICAL VALUE ALERT  Critical Value: potassium - 7.3  Date & Time Notied: 05/19/20 at 0623  Provider Notified:Chundi,MD  Orders Received/Actions taken: No new orders

## 2020-05-19 NOTE — Progress Notes (Signed)
CRITICAL VALUE STICKER  CRITICAL VALUE: CBG 27  RECEIVER (on-site recipient of call): Vilinda Blanks RN  [place] NOTIFIED: [phone]  MESSENGER (representative from lab): Vilinda Blanks RN  MD NOTIFIED: Linna Caprice  TIME OF NOTIFICATION: [phone]  RESPONSE: d50 amp push, d5 .45 NS at 75cc/hr, push PO sprite, repeat CBG in 20 min

## 2020-05-19 NOTE — Progress Notes (Signed)
 Subjective: Feeling well today. Having some pain at incision site. Nausea is much improved. Having some mild abdominal pain. No other concerns. Discussed her liver enzymes and the further lab work we are planning to obtain.   Consults: vascular surgery  Objective:  Vital signs in last 24 hours: Vitals:   05/19/20 0855 05/19/20 0910 05/19/20 0925 05/19/20 1011  BP: 117/78 128/83 113/77 114/86  Pulse: 77 78 77 79  Resp: (!) 23 (!) 22 20 18  Temp: 97.6 F (36.4 C)  (!) 97.2 F (36.2 C) 98 F (36.7 C)  TempSrc:    Oral  SpO2: 92% 92% 95% 99%  Weight:      Height:       Physical Exam  Constitutional:  Chronically ill appearing, thin female  Cardiovascular: Normal rate and regular rhythm.  Pulmonary/Chest: Effort normal and breath sounds normal.  Abdominal: Soft. Bowel sounds are normal. There is no abdominal tenderness.  Neurological: She is alert.  Skin: Skin is warm and dry.  Nursing note and vitals reviewed.  I/Os  Intake/Output Summary (Last 24 hours) at 05/19/2020 1145 Last data filed at 05/19/2020 1012 Gross per 24 hour  Intake 918.85 ml  Output 5 ml  Net 913.85 ml    Telemetry:  Labs: Results for orders placed or performed during the hospital encounter of 05/18/20 (from the past 24 hour(s))  Sedimentation rate     Status: Abnormal   Collection Time: 05/18/20  1:56 PM  Result Value Ref Range   Sed Rate 80 (H) 0 - 22 mm/hr  C-reactive protein     Status: None   Collection Time: 05/18/20  1:56 PM  Result Value Ref Range   CRP 0.9 <1.0 mg/dL  Surgical pcr screen     Status: Abnormal   Collection Time: 05/18/20  8:00 PM   Specimen: Nasal Mucosa; Nasal Swab  Result Value Ref Range   MRSA, PCR NEGATIVE NEGATIVE   Staphylococcus aureus POSITIVE (A) NEGATIVE  CBC     Status: Abnormal   Collection Time: 05/19/20  5:12 AM  Result Value Ref Range   WBC 3.1 (L) 4.0 - 10.5 K/uL   RBC 3.52 (L) 3.87 - 5.11 MIL/uL   Hemoglobin 9.5 (L) 12.0 - 15.0 g/dL   HCT 31.1  (L) 36.0 - 46.0 %   MCV 88.4 80.0 - 100.0 fL   MCH 27.0 26.0 - 34.0 pg   MCHC 30.5 30.0 - 36.0 g/dL   RDW 17.3 (H) 11.5 - 15.5 %   Platelets 179 150 - 400 K/uL   nRBC 0.0 0.0 - 0.2 %  Comprehensive metabolic panel     Status: Abnormal   Collection Time: 05/19/20  5:12 AM  Result Value Ref Range   Sodium 134 (L) 135 - 145 mmol/L   Potassium 7.3 (HH) 3.5 - 5.1 mmol/L   Chloride 94 (L) 98 - 111 mmol/L   CO2 24 22 - 32 mmol/L   Glucose, Bld 92 70 - 99 mg/dL   BUN 84 (H) 6 - 20 mg/dL   Creatinine, Ser 11.30 (H) 0.44 - 1.00 mg/dL   Calcium 8.5 (L) 8.9 - 10.3 mg/dL   Total Protein 7.5 6.5 - 8.1 g/dL   Albumin 2.3 (L) 3.5 - 5.0 g/dL   AST 231 (H) 15 - 41 U/L   ALT 187 (H) 0 - 44 U/L   Alkaline Phosphatase 261 (H) 38 - 126 U/L   Total Bilirubin 1.1 0.3 - 1.2 mg/dL   GFR calc non   Af Amer 4 (L) >60 mL/min   GFR calc Af Amer 4 (L) >60 mL/min   Anion gap 16 (H) 5 - 15  Glucose, capillary     Status: None   Collection Time: 05/19/20  6:41 AM  Result Value Ref Range   Glucose-Capillary 82 70 - 99 mg/dL  I-STAT, chem 8     Status: Abnormal   Collection Time: 05/19/20  7:52 AM  Result Value Ref Range   Sodium 132 (L) 135 - 145 mmol/L   Potassium 6.2 (H) 3.5 - 5.1 mmol/L   Chloride 94 (L) 98 - 111 mmol/L   BUN 80 (H) 6 - 20 mg/dL   Creatinine, Ser 12.70 (H) 0.44 - 1.00 mg/dL   Glucose, Bld 171 (H) 70 - 99 mg/dL   Calcium, Ion 1.06 (L) 1.15 - 1.40 mmol/L   TCO2 28 22 - 32 mmol/L   Hemoglobin 9.9 (L) 12.0 - 15.0 g/dL   HCT 29.0 (L) 36.0 - 46.0 %  Basic metabolic panel     Status: Abnormal   Collection Time: 05/19/20  8:17 AM  Result Value Ref Range   Sodium 134 (L) 135 - 145 mmol/L   Potassium 6.5 (HH) 3.5 - 5.1 mmol/L   Chloride 93 (L) 98 - 111 mmol/L   CO2 26 22 - 32 mmol/L   Glucose, Bld 174 (H) 70 - 99 mg/dL   BUN 84 (H) 6 - 20 mg/dL   Creatinine, Ser 11.01 (H) 0.44 - 1.00 mg/dL   Calcium 8.4 (L) 8.9 - 10.3 mg/dL   GFR calc non Af Amer 4 (L) >60 mL/min   GFR calc Af Amer 5 (L)  >60 mL/min   Anion gap 15 5 - 15  Glucose, capillary     Status: Abnormal   Collection Time: 05/19/20  9:03 AM  Result Value Ref Range   Glucose-Capillary 27 (LL) 70 - 99 mg/dL   Comment 1 Notify RN   Glucose, capillary     Status: Abnormal   Collection Time: 05/19/20  9:31 AM  Result Value Ref Range   Glucose-Capillary 139 (H) 70 - 99 mg/dL   Imaging: DG Chest 1 View  Result Date: 05/19/2020 CLINICAL DATA:  Status post dialysis catheter insertion EXAM: CHEST  1 VIEW COMPARISON:  05/19/2020, 9:32 a.m. FINDINGS: No significant change in AP portable examination. Left neck large bore multi lumen vascular catheter remains in position, tip near the superior cavoatrial junction. Cardiomegaly. Possible small left pleural effusion. IMPRESSION: 1. No significant change in AP portable examination. Left neck large bore multi lumen vascular catheter remains in position, tip near the superior cavoatrial junction. 2.  Cardiomegaly. 3.  Possible small left pleural effusion. Electronically Signed   By: Alex  Bibbey M.D.   On: 05/19/2020 11:27   DG Chest Port 1 View  Result Date: 05/19/2020 CLINICAL DATA:  Tunneled dialysis catheter insertion EXAM: PORTABLE CHEST 1 VIEW COMPARISON:  07/29/2018 FINDINGS: Left neck large bore multi lumen vascular catheter, tip projecting near the superior cavoatrial junction. Cardiomegaly. Possible small left pleural effusion. IMPRESSION: 1. Left neck large bore multi lumen vascular catheter, tip projecting near the superior cavoatrial junction. 2.  Cardiomegaly. 3.  Possible small left pleural effusion. Electronically Signed   By: Alex  Bibbey M.D.   On: 05/19/2020 10:04   DG Fluoro Guide CV Line-No Report  Result Date: 05/19/2020 Fluoroscopy was utilized by the requesting physician.  No radiographic interpretation.   US Abdomen Limited RUQ  Result Date: 05/19/2020 CLINICAL DATA:    Elevated LFTs EXAM: ULTRASOUND ABDOMEN LIMITED RIGHT UPPER QUADRANT COMPARISON:  None.  FINDINGS: Gallbladder: Gallbladder wall is significantly thickened to 11 mm. Negative sonographic Murphy's sign is seen. These changes may be related to chronic cholecystitis. No gallstones are seen. Common bile duct: Diameter: 4.1 mm. Liver: Liver shows no focal mass. Mild ascites is seen. This may contribute to the changes of the gallbladder. Portal vein is patent on color Doppler imaging with normal direction of blood flow towards the liver. Other: None. IMPRESSION: Mild ascites. Gallbladder wall thickening with negative sonographic Murphy's sign. These changes may be related chronic cholecystitis although may be reactive from the underlying ascites. Electronically Signed   By: Mark  Lukens M.D.   On: 05/19/2020 10:51   Assessment/Plan:  Assessment: Ms. Medlock is a 38 yo F w/ a PMHx of ESRD on HD TTS, SLE, chronic systolic congestive heart failure (LVEF 45%, 01/2017) and polysubstance use who presents to the ED via PTAR from dialysis with infiltration of her dialysis fistula.   Plan by Problem:  Active Problems:   ESRD on HD TTS:   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HCC)   Hyperkalemia:  38 yo F w/ a PMHx of ESRD on HD TTS, SLE, chronic systolic congestive heart failure (LVEF 45%, 01/2017), HTN and polysubstance use who presents to the ED via PTAR from dialysis with infiltration of her dialysis fistula. Of note, an area of her fistula has failed to heal since a plication of an ulcer at that site back in December. Patient did report an episode of pustular discharge and some recent fatigue and nausea. Patient afebrile without a leukocytosis. Vascular evaluated patient and does not believe this to be due to infection and placed a tunneled dialysis catheter to allow the left arm to rest.  -K of 7.3 today, s/p albuterol HHN, IV insulin and glucose w/ repeat of 6.2 -nephrology consulted  Plan: -zofran for nausea; monitor for further qt prolongation  -continue IV vanc for possible cellulitis -fu blood cxs  x2 -dilaudid for pain from incision site -CBC in AM -monitor fever curve -follow up nephro recs  Transaminitis: Elevated AST, ALT and alk phos noted incidentally on CMP.  These elevations are new.  Denies frequent alcohol use.  Denies IV drug use.  Has had nausea and fatigue but no abdominal pain.  Concern is for an autoimmune process given her history of lupus.  Plan: -Right upper quadrant ultrasound -ANA, anti-smooth muscle antibody, antimicrosomal antibody, hepatitis A antibody IgM, hepatitis B core antibody IgM, hepatitis B DNA ultra quantitative PCR, hepatitis B surface antibody, hepatitis B surface antigen, hepatitis C antibody, IgG and mitochondrial antibodies  SLE: -not currently on any DMARDs -previously seen in our clinic with poor follow up  -similar situation with rheumatology -per office note from 2015, pt has never been able to get on a stable lupus regimen due to poor follow up -obtained ESR, CRP, anti dsDNA, complement C3 and C4 to assess disease status -ESR 80, normal CRP, C3, C4 and anti dsDNA pending  Plan: -fu rheum labs -evaluating for possible autoimmune hepatitis as above  Chronic Systolic Congestive Heart Failure/HFrEF (LVEF 45%, 01/2017): HTN: -pt w/ hx of hypertension and HFrEF on coreg and dilt outpatient -has not been taking her coreg as she ran out; she probably hasn't taken it in around a month she believes -denies increased SOB from baseline -denies LE edema -lungs clear and no LE swelling on exam  Plan: -continue home coreg and dilt  Dispo: Anticipated discharge pending clinical   course.  , , MD 05/19/2020, 11:45 AM Pager: [phone] 

## 2020-05-19 NOTE — Progress Notes (Signed)
  Progress Note    05/19/2020 7:15 AM Day of Surgery  Subjective:  Still having left arm pain  Vitals:   05/18/20 2101 05/19/20 0456  BP: 108/80 118/75  Pulse: 92 87  Resp: 18 18  Temp: 98.4 F (36.9 C) 97.9 F (36.6 C)  SpO2: 96% 99%    Physical Exam: aaox3 Non labored respirations Left arm fistula with palpable thrill, painful to palpation  CBC    Component Value Date/Time   WBC 3.1 (L) 05/19/2020 0512   RBC 3.52 (L) 05/19/2020 0512   HGB 9.5 (L) 05/19/2020 0512   HCT 31.1 (L) 05/19/2020 0512   PLT 179 05/19/2020 0512   MCV 88.4 05/19/2020 0512   MCH 27.0 05/19/2020 0512   MCHC 30.5 05/19/2020 0512   RDW 17.3 (H) 05/19/2020 0512   LYMPHSABS 0.8 05/18/2020 0852   MONOABS 0.6 05/18/2020 0852   EOSABS 0.1 05/18/2020 0852   BASOSABS 0.0 05/18/2020 0852    BMET    Component Value Date/Time   NA 134 (L) 05/19/2020 0512   K 7.3 (HH) 05/19/2020 0512   CL 94 (L) 05/19/2020 0512   CO2 24 05/19/2020 0512   GLUCOSE 92 05/19/2020 0512   BUN 84 (H) 05/19/2020 0512   CREATININE 11.30 (H) 05/19/2020 0512   CALCIUM 8.5 (L) 05/19/2020 0512   CALCIUM 6.7 (L) 01/31/2014 1309   GFRNONAA 4 (L) 05/19/2020 0512   GFRAA 4 (L) 05/19/2020 0512    INR    Component Value Date/Time   INR 1.14 06/30/2018 1011     Intake/Output Summary (Last 24 hours) at 05/19/2020 0715 Last data filed at 05/19/2020 0600 Gross per 24 hour  Intake 397.6 ml  Output 0 ml  Net 397.6 ml     Assessment:  39 y.o. female is here with infiltration of left arm avf  Plan: K is elevated and is being treated by anesthesia. Will recheck after treatment. Plan for tdc pending K level to allow arm to rest.    Erlene Quan C. Donzetta Matters, MD [facility] Office: [phone] Pager: [phone]  05/19/2020 7:15 AM

## 2020-05-19 NOTE — Consult Note (Signed)
[facility] Renal Consultation Note    Indication for Consultation:  Management of ESRD/hemodialysis; anemia, hypertension/volume and secondary hyperparathyroidism  HPI: [name] is a 39 y.o. 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 HD started 01/2014. PMH also significant for HTN, CHF, SLE, polysubstance abuse.   Admitted with infiltration of her AVF fistula. She has an area of her fistula that has failed to heal s/p revision of AVF in Dec 2020. She presented for her usual dialysis treatment on Saturday with swelling and drainage from her fistula. Dialysis staff were unable to cannulate the fistula and she was sent to ED for evaluation. She was seen by Dr. Donzetta Matters with plans to rest the fistula and place a dialysis catheter. Empiric antibiotics started and blood cultures collected. Subsequently found to be hyperkalemic K+  7.3 this am. Received calcium gluconate, insulin. Lokelma ordered. Repeat K+ 6.5.   [facility] T[facility]. Last dialysis 5/20, received 3 hours of treatment. She cuts almost every treatment short and has not received a complete dialysis treatment this month.    Past Medical History:  Diagnosis Date  . Anemia    low iron - receives iron at dialysis  . Chronic systolic congestive heart failure ([place]) 03/16/2016  . Dyspnea   . ESRD (end stage renal disease) ([place])    [facility] _ [place]  . H/O pericarditis 01/17/2013  . H/O pleural effusion 01/17/2013  . Heart murmur   . Lupus (systemic lupus erythematosus) ([facility])    Previously followed with Dr. Charlestine Night, has not followed up recently  . 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 Pneumonia    many times  . Polysubstance abuse ([place])    cocaine, MJ, tobacco  . S/P pericardiocentesis 01/17/2013   H/o pericardial effusion with tamponade 2006    . Seizures ([place])    during pregnancy 1 time  . Streptococcal bacteremia 01/23/2013   She had two S. pneumonae bacteremia on 01/21/2013. Sensitive to Peniccilin    Past Surgical History:  Procedure Laterality Date  . AV FISTULA PLACEMENT    . BASCILIC VEIN TRANSPOSITION Left 02/05/2014   Procedure: [name];  Surgeon: [name], MD;  Location: [facility];  Service: Vascular;  Laterality: Left;  . FISTULA SUPERFICIALIZATION Left 05/30/2018   Procedure: FISTULA PLICATION BASILIC VEIN TRANSPOSITION;  Surgeon: Angelia Mould, MD;  Location: [facility];  Service: Vascular;  Laterality: Left;  . FISTULA SUPERFICIALIZATION Left 74/11/8785   Procedure: PLICATION OF LEFT ARTERIOVENOUS FISTULA ULCER;  Surgeon: [name], MD;  Location: [facility];  Service: Vascular;  Laterality: Left;  Marland Kitchen VENOGRAM Right 01/31/2014   Procedure: DIALYSIS CATHETER;  Surgeon: Serafina Mitchell, MD;  Location: [facility] CATH LAB;  Service: Cardiovascular;  Laterality: Right;   No family history on file. Social History:  reports that she has been smoking cigarettes. She has a 1.80 pack-year smoking history. She has never used smokeless tobacco. She reports current alcohol use. She reports current drug use. Frequency: 5.00 times per week. Drug: Marijuana. Allergies  Allergen Reactions  . Tobramycin Sulfate Swelling    Eye swelling   Prior to Admission medications   Medication Sig Start Date End Date Taking? Authorizing Provider  acetaminophen (TYLENOL) 500 MG tablet Take by mouth. 07/20/18  Yes [provider]  B Complex-C-Folic Acid (RENAL VITAMIN) 0.8 MG TABS Take 1 tablet  by mouth daily.  10/03/19  Yes [provider]  calcium acetate (PHOSLO) 667 MG capsule Take 1,334-2,668 mg by mouth See admin instructions. Take 4 capsule (2668 mg) by mouth three times a day with each meal and 2 capsules (1334 mg) with each snack   Yes [provider]  carvedilol (COREG) 12.5 MG tablet Take 12.5 mg by  mouth 2 (two) times daily. Take one tablet (12.5 mg) by mouth every morning, take one tablet (12.5 mg) at night on Sunday, Monday, Wednesday, Friday 11/07/19  Yes [provider]  Cinacalcet HCl (SENSIPAR PO) Take 1 Dose by mouth daily.   Yes [provider]  darbepoetin (ARANESP) 60 MCG/0.3ML SOLN injection Inject 0.3 mLs (60 mcg total) into the vein every Wednesday with hemodialysis. Patient taking differently: Inject 60 mcg into the vein every Saturday.  02/08/14  Yes Jessee Avers, MD  diltiazem (CARDIZEM) 30 MG tablet Take 1 tablet (30 mg total) by mouth every 6 (six) hours as needed (SVT). Patient taking differently: Take 30 mg by mouth 2 (two) times daily as needed (SVT).  07/19/18  Yes Alphonzo Grieve, MD  diphenhydrAMINE (BENADRYL) 25 MG tablet Take 25-50 mg by mouth See admin instructions. Take one tablet (25 mg) by mouth daily at bedtime, take two tablets (50 mg) immediately prior to dialysis - Tuesday, Thursday, Saturday   Yes [provider]  diphenhydramine-acetaminophen (TYLENOL PM) 25-500 MG TABS tablet Take 0.5-1 tablets by mouth at bedtime as needed (sleep).   Yes [provider]  ferric gluconate 62.5 mg in sodium chloride 0.9 % 100 mL Inject 62.5 mg into the vein every Thursday with hemodialysis. 03/09/16  Yes Rice, Resa Miner, MD  hydroxychloroquine (PLAQUENIL) 200 MG tablet Take 1 tablet (200 mg total) by mouth daily. 07/19/18  Yes Alphonzo Grieve, MD  hydrOXYzine (ATARAX/VISTARIL) 10 MG tablet Take 1 tablet (10 mg total) by mouth 3 (three) times daily as needed for anxiety. Patient taking differently: Take 10 mg by mouth 3 (three) times daily as needed for itching or anxiety.  07/29/19  Yes Caccavale, Sophia, PA-C  iron sucrose in sodium chloride 0.9 % 100 mL Iron Sucrose (Venof[place]) 01/04/20 12/26/20 Yes [provider]  loperamide (IMODIUM A-D) 2 MG tablet Take 4 mg by mouth See admin instructions. Take two tablest (4 mg) by mouth on  dialysis days (Tuesday, Thursday, Saturday)   Yes [provider]  Methoxy PEG-Epoetin Beta (MIRCERA IJ) Inject 1 Dose as directed every 14 (fourteen) days. 03/16/20 03/15/21 Yes [provider]  multivitamin (RENA-VIT) TABS tablet Take 1 tablet by mouth daily. Patient taking differently: Take 1 tablet by mouth at bedtime.  02/18/17  Yes Holley Raring, MD  Nutritional Supplements (NOVASOURCE RENAL) LIQD Take 237 mLs by mouth See admin instructions. Take 1 container (237 mls) by at bedtime on dialysis days (Tuesday, Thursday, Saturday)   Yes [provider]   Current Facility-Administered Medications  Medication Dose Route Frequency Provider Last Rate Last Admin  . acetaminophen (TYLENOL) tablet 650 mg  650 mg Oral Q6H PRN Gabriel Earing, PA-C   650 mg at 05/19/20 1104   Or  . acetaminophen (TYLENOL) suppository 650 mg  650 mg Rectal Q6H PRN Rhyne, Samantha J, PA-C      . albuterol (PROVENTIL) (2.5 MG/3ML) 0.083% nebulizer solution 2.5 mg  2.5 mg Nebulization Q2H PRN Rhyne, Samantha J, PA-C      . bismuth subsalicylate (PEPTO BISMOL) 262 MG/15ML suspension 30 mL  30 mL Oral Once Hardy,  Remo Lipps, MD      . carvedilol (COREG) tablet 12.5 mg  12.5 mg Oral BID Leontine Locket J, PA-C   12.5 mg at 05/19/20 1104  . Chlorhexidine Gluconate Cloth 2 % PADS 6 each  6 each Topical Daily Gabriel Earing, PA-C   6 each at 05/19/20 0617  . cinacalcet (SENSIPAR) tablet 30 mg  30 mg Oral Daily Rhyne, Samantha J, PA-C   30 mg at 05/19/20 1104  . diltiazem (CARDIZEM) tablet 30 mg  30 mg Oral BID PRN Rhyne, Samantha J, PA-C      . heparin injection 5,000 Units  5,000 Units Subcutaneous Q8H Rhyne, Hulen Shouts, PA-C   5,000 Units at 05/19/20 1207  . HYDROmorphone (DILAUDID) tablet 2 mg  2 mg Oral Q4H PRN Asencion Noble, MD   2 mg at 05/19/20 1206  . hydrOXYzine (ATARAX/VISTARIL) tablet 10 mg  10 mg Oral TID PRN Leontine Locket J, PA-C   10 mg at 05/18/20 1650  . insulin aspart  (novoLOG) injection 5 Units  5 Units Intravenous Once Rhyne, Samantha J, PA-C      . mupirocin ointment (BACTROBAN) 2 % 1 application  1 application Nasal BID Gabriel Earing, PA-C   1 application at 16/10/96 0617  . ondansetron (ZOFRAN) tablet 4 mg  4 mg Oral Q6H PRN Rhyne, Samantha J, PA-C       Or  . ondansetron (ZOFRAN) injection 4 mg  4 mg Intravenous Q6H PRN Rhyne, Samantha J, PA-C   4 mg at 05/19/20 0454  . sodium zirconium cyclosilicate (LOKELMA) packet 10 g  10 g Oral Once Rhyne, Samantha J, PA-C      . [START ON 05/21/2020] vancomycin (VANCOREADY) IVPB 500 mg/100 mL  500 mg Intravenous Q T,Th,Sa-HD Rhyne, Samantha J, PA-C         ROS: As per HPI otherwise negative.  Physical Exam: Vitals:   05/19/20 0855 05/19/20 0910 05/19/20 0925 05/19/20 1011  BP: 117/78 128/83 113/77 114/86  Pulse: 77 78 77 79  Resp: (!) 23 (!) 22 20 18   Temp: 97.6 F (36.4 C)  (!) 97.2 F (36.2 C) 98 F (36.7 C)  TempSrc:    Oral  SpO2: 92% 92% 95% 99%  Weight:      Height:         General: Chronically ill appearing female, nad  Head: NCAT sclera not icteric MMM Neck: Supple. No JVD  Lungs: Clear bilaterally Breathing is unlabored. Heart: RRR with S1 S2 Abdomen: soft ; mild diffuse tenderness  Lower extremities:without edema or ischemic changes, no open wounds  Neuro: A & O  X 3. Moves all extremities spontaneously. Psych:  Responds to questions appropriately with a normal affect. Dialysis Access: Left IJ TDC; Swollen  LUE AVF +bruit  Labs: Basic Metabolic Panel: Recent Labs  Lab 05/19/20 0512 05/19/20 0512 05/19/20 0752 05/19/20 0817 05/19/20 1130  NA 134*   < > 132* 134* 135  K 7.3*   < > 6.2* 6.5* 6.4*  CL 94*   < > 94* 93* 93*  CO2 24  --   --  26 26  GLUCOSE 92   < > 171* 174* 95  BUN 84*   < > 80* 84* 85*  CREATININE 11.30*   < > 12.70* 11.01* 11.43*  CALCIUM 8.5*  --   --  8.4* 8.6*   < > = values in this interval not displayed.   Liver Function Tests: Recent Labs  Lab  05/19/20 [phone]  05/19/20 1130  AST 231* 188*  ALT 187* 180*  ALKPHOS 261* 278*  BILITOT 1.1 1.2  PROT 7.5 8.5*  ALBUMIN 2.3* 2.5*   No results for input(s): LIPASE, AMYLASE in the last 168 hours. No results for input(s): AMMONIA in the last 168 hours. CBC: Recent Labs  Lab 05/18/20 0852 05/19/20 0512 05/19/20 0752  WBC 4.7 3.1*  --   NEUTROABS 3.2  --   --   HGB 8.9* 9.5* 9.9*  HCT 30.0* 31.1* 29.0*  MCV 91.2 88.4  --   PLT 194 179  --    Cardiac Enzymes: No results for input(s): CKTOTAL, CKMB, CKMBINDEX, TROPONINI in the last 168 hours. CBG: Recent Labs  Lab 05/19/20 0641 05/19/20 0903 05/19/20 0931  GLUCAP 82 27* 139*   Iron Studies: No results for input(s): IRON, TIBC, TRANSFERRIN, FERRITIN in the last 72 hours. Studies/Results: DG Chest 1 View  Result Date: 05/19/2020 CLINICAL DATA:  Status post dialysis catheter insertion EXAM: CHEST  1 VIEW COMPARISON:  05/19/2020, 9:32 a.m. FINDINGS: No significant change in AP portable examination. Left neck large bore multi lumen vascular catheter remains in position, tip near the superior cavoatrial junction. Cardiomegaly. Possible small left pleural effusion. IMPRESSION: 1. No significant change in AP portable examination. Left neck large bore multi lumen vascular catheter remains in position, tip near the superior cavoatrial junction. 2.  Cardiomegaly. 3.  Possible small left pleural effusion. Electronically Signed   By: Eddie Candle M.D.   On: 05/19/2020 11:27   DG Chest Port 1 View  Result Date: 05/19/2020 CLINICAL DATA:  Tunneled dialysis catheter insertion EXAM: PORTABLE CHEST 1 VIEW COMPARISON:  07/29/2018 FINDINGS: Left neck large bore multi lumen vascular catheter, tip projecting near the superior cavoatrial junction. Cardiomegaly. Possible small left pleural effusion. IMPRESSION: 1. Left neck large bore multi lumen vascular catheter, tip projecting near the superior cavoatrial junction. 2.  Cardiomegaly. 3.  Possible small  left pleural effusion. Electronically Signed   By: Eddie Candle M.D.   On: 05/19/2020 10:04   DG Fluoro Guide CV Line-No Report  Result Date: 05/19/2020 Fluoroscopy was utilized by the requesting physician.  No radiographic interpretation.   US Abdomen Limited RUQ  Result Date: 05/19/2020 CLINICAL DATA:  Elevated LFTs EXAM: ULTRASOUND ABDOMEN LIMITED RIGHT UPPER QUADRANT COMPARISON:  None. FINDINGS: Gallbladder: Gallbladder wall is significantly thickened to 11 mm. Negative sonographic Murphy's sign is seen. These changes may be related to chronic cholecystitis. No gallstones are seen. Common bile duct: Diameter: 4.1 mm. Liver: Liver shows no focal mass. Mild ascites is seen. This may contribute to the changes of the gallbladder. Portal vein is patent on color Doppler imaging with normal direction of blood flow towards the liver. Other: None. IMPRESSION: Mild ascites. Gallbladder wall thickening with negative sonographic Murphy's sign. These changes may be related chronic cholecystitis although may be reactive from the underlying ascites. Electronically Signed   By: Inez Catalina M.D.   On: 05/19/2020 10:51    Dialysis Orders:  [facility] 3h 62min 400/1.5A 47kg 2K/2Ca AVF Hep 2000 Calcitriol 2.0  Mircera 225 (last 5/15)    Assessment/Plan: 1. AVF malfunction/infiltration. Evaluated by VVS -allowing fistula to rest. TDC placed per Dr. Donzetta Matters today.  2. Hyperkalemia 2/2 Missed HD.  K+ 6.4 s/p temporizing meds.  Will correct with urgent HD this afternoon. Follow labs.   3. ESRD -  HD TTS. Missed HD yesterday d/t #1. HD off schedule today.  4. Hypertension/volume  -  BP controlled. No gross  volume on exam. UF to EDW as tolerated.  5. Anemia  - Hgb 9.9. On ESA as outpatient.  6. Metabolic bone disease -  Continue Ca acetate binder/VDRA 7. Abd pain/Elevated LFTs - mild ascites on [name]. Per primary  8. Nutrition - Renal diet when eating   Lynnda Child PA-C [place] Pager  [phone] 05/19/2020, 1:36 PM

## 2020-05-19 NOTE — Anesthesia Postprocedure Evaluation (Signed)
Anesthesia Post Note  Patient: [name]  Procedure(s) Performed: TUNNELED INSERTION  OF DIALYSIS CATHETER (N/A )     Patient location during evaluation: PACU Anesthesia Type: General Level of consciousness: awake and alert Pain management: pain level controlled Vital Signs Assessment: post-procedure vital signs reviewed and stable Respiratory status: spontaneous breathing, nonlabored ventilation, respiratory function stable and patient connected to nasal cannula oxygen Cardiovascular status: blood pressure returned to baseline and stable Postop Assessment: no apparent nausea or vomiting Anesthetic complications: no    Last Vitals:  Vitals:   05/19/20 0925 05/19/20 1011  BP: 113/77 114/86  Pulse: 77 79  Resp: 20 18  Temp: (!) 36.2 C 36.7 C  SpO2: 95% 99%    Last Pain:  Vitals:   05/19/20 1306  TempSrc:   PainSc: 4                  Mattilynn Forrer COKER

## 2020-05-19 NOTE — Anesthesia Preprocedure Evaluation (Addendum)
Anesthesia Evaluation  Patient identified by MRN, date of birth, ID band Patient awake    Reviewed: NPO status , Patient's Chart, lab work & pertinent test results  Airway Mallampati: II  TM Distance: >3 FB     Dental  (+) Loose,    Pulmonary Current Smoker,    breath sounds clear to auscultation       Cardiovascular  Rhythm:Regular Rate:Normal     Neuro/Psych    GI/Hepatic   Endo/Other    Renal/GU      Musculoskeletal   Abdominal   Peds  Hematology   Anesthesia Other Findings   Reproductive/Obstetrics                             Anesthesia Physical Anesthesia Plan  ASA: III  Anesthesia Plan: General   Post-op Pain Management:    Induction: Intravenous  PONV Risk Score and Plan:   Airway Management Planned: LMA  Additional Equipment:   Intra-op Plan:   Post-operative Plan:   Informed Consent: I have reviewed the patients History and Physical, chart, labs and discussed the procedure including the risks, benefits and alternatives for the proposed anesthesia with the patient or authorized representative who has indicated his/her understanding and acceptance.     Dental advisory given  Plan Discussed with: CRNA and Anesthesiologist  Anesthesia Plan Comments: (K-7.3 last HD 5/29, will give albuterol HHN, IV insulin and glucose and repeat K.  Repeat K-6.2 will proceed to OR)       Anesthesia Quick Evaluation

## 2020-05-19 NOTE — Procedures (Signed)
Stable on HD at this time. Post op pain from new TDC.    I was present at this dialysis session, have reviewed the session itself and made  appropriate changes Kelly Splinter MD [place] pager [phone]   05/19/2020, 3:32 PM

## 2020-05-19 NOTE — Op Note (Signed)
Patient name: [name] MRN: [id number] DOB: May 11, 1981 Sex: female  05/19/2020 Pre-operative Diagnosis: End-stage renal disease Post-operative diagnosis:  Same Surgeon:  Apolinar Junes C. Randie Heinz, MD Procedure Performed: 1.  Ultrasound-guided cannulation right internal jugular vein 2.  Placement of left internal jugular vein 19 cm tunneled dialysis catheter with ultrasound and fluoroscopic guidance  Indications: 39 year old female with end-stage renal disease having difficulties with her left arm fistula including recent infiltration.  She is now indicated for tunneled dialysis catheter to allow her left arm to rest.  Findings: The internal jugular vein was able to be cannulated but a wire would not pass.  Ultimately I placed the catheter via ultrasound and fluoroscopic guidance in the left internal jugular vein.   Procedure:  The patient was identified in the holding area and taken to the operating room where she is placed supine operative when general anesthesia induced.  She was sterilely prepped draped in the neck and chest in usual fashion antibiotics were administered timeout was called.  Ultrasound was used to identify the right internal jugular vein.  This did appear to have chronic appearing thrombus but was also compressible.  I cannulated with 18-gauge needle.  The J-wire would not pass.  I ultimately removed both.  I then cannulated with 18-gauge needle using fluoroscopic guidance attempted to pass first the J-wire then a micropuncture wire.  Unfortunately neither would pass.  I removed the needle and pressure was held till hemostasis obtained.  I turned my attention to the left internal jugular vein.  Ultrasound was used this did also appear to have some chronic appearing thrombus.  I was able to cannulate this with ultrasound guidance and passed a wire centrally with fluoroscopic guidance.  A 19 cm was tunneled via a counterincision.  I serially dilated the wire tract.  I placed  introducer sheath under fluoroscopic guidance and the catheter to the SVC atrial junction.  It was flushed with heparinized saline locked with 1.6 cc of concentrated heparin either port.  Was affixed to the skin with 3-0 nylon suture and the neck incision was closed with 4 Monocryl.  Dermabond placed to both sites.  Sterile dressing was placed.  She was awakened anesthesia having tolerated procedure without any complication.  EBL: 20 cc    Stellar Gensel C. Randie Heinz, MD [facility] Office: [phone] Pager: [phone]

## 2020-05-19 NOTE — Plan of Care (Signed)
  Problem: Education: Goal: Knowledge of General Education information will improve Description Including pain rating scale, medication(s)/side effects and non-pharmacologic comfort measures Outcome: Progressing   

## 2020-05-19 NOTE — Anesthesia Procedure Notes (Addendum)
Procedure Name: LMA Insertion Date/Time: 05/19/2020 8:07 AM Performed by: Sammie Bench, CRNA Pre-anesthesia Checklist: Patient identified, Emergency Drugs available, Suction available and Patient being monitored Patient Re-evaluated:Patient Re-evaluated prior to induction Oxygen Delivery Method: Circle System Utilized Preoxygenation: Pre-oxygenation with 100% oxygen Induction Type: IV induction Ventilation: Mask ventilation without difficulty LMA: LMA inserted LMA Size: 4.0 Number of attempts: 1 Airway Equipment and Method: Bite block Placement Confirmation: positive ETCO2 Tube secured with: Tape Dental Injury: Teeth and Oropharynx as per pre-operative assessment  Comments: Patient has bottom teeth that are loose and can be moved with the finger prior to induction of anesthesia. Dental advisory given and soft bite block placed between molars

## 2020-05-19 NOTE — Transfer of Care (Signed)
Immediate Anesthesia Transfer of Care Note  Patient: [name]  Procedure(s) Performed: TUNNELED INSERTION  OF DIALYSIS CATHETER (N/A )  Patient Location: PACU  Anesthesia Type:General  Level of Consciousness: awake, alert , oriented and patient cooperative  Airway & Oxygen Therapy: Patient Spontanous Breathing and Patient connected to nasal cannula oxygen  Post-op Assessment: Report given to RN and Post -op Vital signs reviewed and stable  Post vital signs: Reviewed and stable  Last Vitals:  Vitals Value Taken Time  BP    Temp    Pulse    Resp    SpO2      Last Pain:  Vitals:   05/18/20 2155  TempSrc:   PainSc: 0-No pain         Complications: No apparent anesthesia complications

## 2020-05-20 LAB — COMPREHENSIVE METABOLIC PANEL
ALT: 107 U/L — ABNORMAL HIGH (ref 0–44)
AST: 85 U/L — ABNORMAL HIGH (ref 15–41)
Albumin: 2.3 g/dL — ABNORMAL LOW (ref 3.5–5.0)
Alkaline Phosphatase: 214 U/L — ABNORMAL HIGH (ref 38–126)
Anion gap: 14 (ref 5–15)
BUN: 47 mg/dL — ABNORMAL HIGH (ref 6–20)
CO2: 23 mmol/L (ref 22–32)
Calcium: 7.8 mg/dL — ABNORMAL LOW (ref 8.9–10.3)
Chloride: 99 mmol/L (ref 98–111)
Creatinine, Ser: 6.61 mg/dL — ABNORMAL HIGH (ref 0.44–1.00)
GFR calc Af Amer: 9 mL/min — ABNORMAL LOW (ref >60)
GFR calc non Af Amer: 7 mL/min — ABNORMAL LOW (ref >60)
Glucose, Bld: 113 mg/dL — ABNORMAL HIGH (ref 70–99)
Potassium: 4.7 mmol/L (ref 3.5–5.1)
Sodium: 136 mmol/L (ref 135–145)
Total Bilirubin: 0.5 mg/dL (ref 0.3–1.2)
Total Protein: 7.5 g/dL (ref 6.5–8.1)

## 2020-05-20 LAB — ANA: Anti Nuclear Antibody (ANA): POSITIVE — AB

## 2020-05-20 LAB — CBC
HCT: 29.9 % — ABNORMAL LOW (ref 36.0–46.0)
Hemoglobin: 9.1 g/dL — ABNORMAL LOW (ref 12.0–15.0)
MCH: 27.4 pg (ref 26.0–34.0)
MCHC: 30.4 g/dL (ref 30.0–36.0)
MCV: 90.1 fL (ref 80.0–100.0)
Platelets: 177 10*3/uL (ref 150–400)
RBC: 3.32 MIL/uL — ABNORMAL LOW (ref 3.87–5.11)
RDW: 17.3 % — ABNORMAL HIGH (ref 11.5–15.5)
WBC: 3.3 10*3/uL — ABNORMAL LOW (ref 4.0–10.5)
nRBC: 0 % (ref 0.0–0.2)

## 2020-05-20 LAB — ANTI-SMOOTH MUSCLE ANTIBODY, IGG: F-Actin IgG: 47 Units — ABNORMAL HIGH (ref 0–19)

## 2020-05-20 LAB — GLUCOSE, CAPILLARY: Glucose-Capillary: 73 mg/dL (ref 70–99)

## 2020-05-20 LAB — ANTI-DNA ANTIBODY, DOUBLE-STRANDED: ds DNA Ab: 3 IU/mL (ref 0–9)

## 2020-05-20 LAB — POTASSIUM: Potassium: 5 mmol/L (ref 3.5–5.1)

## 2020-05-20 LAB — IGG: IgG (Immunoglobin G), Serum: 3427 mg/dL — ABNORMAL HIGH (ref 586–1602)

## 2020-05-20 LAB — ANTI-MICROSOMAL ANTIBODY LIVER / KIDNEY: LKM1 Ab: 1.5 Units (ref 0.0–20.0)

## 2020-05-20 LAB — HEPATITIS B SURFACE ANTIBODY, QUANTITATIVE: Hep B S AB Quant (Post): 222.1 m[IU]/mL (ref >9.9)

## 2020-05-20 LAB — MITOCHONDRIAL ANTIBODIES: Mitochondrial M2 Ab, IgG: <20 Units (ref 0.0–20.0)

## 2020-05-20 MED ORDER — ONDANSETRON HCL 4 MG/2ML IJ SOLN
INTRAMUSCULAR | Status: AC
  Filled 2020-05-20: qty 2

## 2020-05-20 MED ORDER — FENTANYL CITRATE (PF) 100 MCG/2ML IJ SOLN
INTRAMUSCULAR | Status: AC
  Filled 2020-05-20: qty 2

## 2020-05-20 MED ORDER — MIDAZOLAM HCL 2 MG/2ML IJ SOLN
INTRAMUSCULAR | Status: AC
  Filled 2020-05-20: qty 2

## 2020-05-20 NOTE — Progress Notes (Signed)
Subjective:  Patient reports that she is feeling better today. She has not followed up with rheumatology because of moving and not having a rheumatologist in the area. She reports she will see someone if she is referred.    Consults: vascular surgery  Objective:  Vital signs in last 24 hours: Vitals:   05/19/20 1819 05/19/20 2100 05/20/20 0448 05/20/20 0950  BP: 132/80 125/73 120/84 119/83  Pulse: 81 85 79 79  Resp: (!) _0 Temp: (!) 97.4 F (36.3 C) 98.3 F (36.8 C) 98 F (36.7 C) 98.4 F (36.9 C)  TempSrc: Oral   Oral  SpO2: 97% 95% 96% 96%  Weight: 47.4 kg     Height:       Physical Exam  Constitutional: No distress.  thin female  Cardiovascular: Normal rate and regular rhythm.  Pulmonary/Chest: Effort normal and breath sounds normal.  Abdominal: Soft. Bowel sounds are normal. There is no abdominal tenderness.  Neurological: She is alert.  Skin: Skin is warm and dry. She is not diaphoretic.  Nursing note and vitals reviewed.  I/Os  Intake/Output Summary (Last 24 hours) at 05/20/2020 0956 Last data filed at 05/20/2020 0448 Gross per 24 hour  Intake 152.92 ml  Output 3019 ml  Net -2866.08 ml    Telemetry:  Labs: Results for orders placed or performed during the hospital encounter of 05/18/20 (from the past 24 hour(s))  Hepatitis B core antibody, IgM     Status: None   Collection Time: 05/19/20 11:30 AM  Result Value Ref Range   Hep B C IgM NON REACTIVE NON REACTIVE  Hepatitis B surface antigen     Status: None   Collection Time: 05/19/20 11:30 AM  Result Value Ref Range   Hepatitis B Surface Ag NON REACTIVE NON REACTIVE  Hepatitis C antibody     Status: None   Collection Time: 05/19/20 11:30 AM  Result Value Ref Range   HCV Ab NON REACTIVE NON REACTIVE  Hepatitis A antibody, IgM     Status: None   Collection Time: 05/19/20 11:30 AM  Result Value Ref Range   Hep A IgM NON REACTIVE NON REACTIVE  Comprehensive metabolic panel     Status:  Abnormal   Collection Time: 05/19/20 11:30 AM  Result Value Ref Range   Sodium 135 135 - 145 mmol/L   Potassium 6.4 (HH) 3.5 - 5.1 mmol/L   Chloride 93 (L) 98 - 111 mmol/L   CO2 26 22 - 32 mmol/L   Glucose, Bld 95 70 - 99 mg/dL   BUN 85 (H) 6 - 20 mg/dL   Creatinine, Ser 11.43 (H) 0.44 - 1.00 mg/dL   Calcium 8.6 (L) 8.9 - 10.3 mg/dL   Total Protein 8.5 (H) 6.5 - 8.1 g/dL   Albumin 2.5 (L) 3.5 - 5.0 g/dL   AST 188 (H) 15 - 41 U/L   ALT 180 (H) 0 - 44 U/L   Alkaline Phosphatase 278 (H) 38 - 126 U/L   Total Bilirubin 1.2 0.3 - 1.2 mg/dL   GFR calc non Af Amer 4 (L) >60 mL/min   GFR calc Af Amer 4 (L) >60 mL/min   Anion gap 16 (H) 5 - 15  Potassium     Status: None   Collection Time: 05/19/20  6:44 PM  Result Value Ref Range   Potassium 3.8 3.5 - 5.1 mmol/L  Potassium     Status: None   Collection Time: 05/19/20 10:39 PM  Result Value  Ref Range   Potassium 4.6 3.5 - 5.1 mmol/L  Potassium     Status: None   Collection Time: 05/20/20  4:04 AM  Result Value Ref Range   Potassium 5.0 3.5 - 5.1 mmol/L  Comprehensive metabolic panel     Status: Abnormal   Collection Time: 05/20/20  6:47 AM  Result Value Ref Range   Sodium 136 135 - 145 mmol/L   Potassium 4.7 3.5 - 5.1 mmol/L   Chloride 99 98 - 111 mmol/L   CO2 23 22 - 32 mmol/L   Glucose, Bld 113 (H) 70 - 99 mg/dL   BUN 47 (H) 6 - 20 mg/dL   Creatinine, Ser 6.61 (H) 0.44 - 1.00 mg/dL   Calcium 7.8 (L) 8.9 - 10.3 mg/dL   Total Protein 7.5 6.5 - 8.1 g/dL   Albumin 2.3 (L) 3.5 - 5.0 g/dL   AST 85 (H) 15 - 41 U/L   ALT 107 (H) 0 - 44 U/L   Alkaline Phosphatase 214 (H) 38 - 126 U/L   Total Bilirubin 0.5 0.3 - 1.2 mg/dL   GFR calc non Af Amer 7 (L) >60 mL/min   GFR calc Af Amer 9 (L) >60 mL/min   Anion gap 14 5 - 15  CBC     Status: Abnormal   Collection Time: 05/20/20  6:47 AM  Result Value Ref Range   WBC 3.3 (L) 4.0 - 10.5 K/uL   RBC 3.32 (L) 3.87 - 5.11 MIL/uL   Hemoglobin 9.1 (L) 12.0 - 15.0 g/dL   HCT 29.9 (L) 36.0 -  46.0 %   MCV 90.1 80.0 - 100.0 fL   MCH 27.4 26.0 - 34.0 pg   MCHC 30.4 30.0 - 36.0 g/dL   RDW 17.3 (H) 11.5 - 15.5 %   Platelets 177 150 - 400 K/uL   nRBC 0.0 0.0 - 0.2 %  Glucose, capillary     Status: None   Collection Time: 05/20/20  7:53 AM  Result Value Ref Range   Glucose-Capillary 73 70 - 99 mg/dL   Imaging: DG Chest 1 View  Result Date: 05/19/2020 CLINICAL DATA:  Status post dialysis catheter insertion EXAM: CHEST  1 VIEW COMPARISON:  05/19/2020, 9:32 a.m. FINDINGS: No significant change in AP portable examination. Left neck large bore multi lumen vascular catheter remains in position, tip near the superior cavoatrial junction. Cardiomegaly. Possible small left pleural effusion. IMPRESSION: 1. No significant change in AP portable examination. Left neck large bore multi lumen vascular catheter remains in position, tip near the superior cavoatrial junction. 2.  Cardiomegaly. 3.  Possible small left pleural effusion. Electronically Signed   By: Eddie Candle M.D.   On: 05/19/2020 11:27   US Abdomen Limited RUQ  Result Date: 05/19/2020 CLINICAL DATA:  Elevated LFTs EXAM: ULTRASOUND ABDOMEN LIMITED RIGHT UPPER QUADRANT COMPARISON:  None. FINDINGS: Gallbladder: Gallbladder wall is significantly thickened to 11 mm. Negative sonographic Murphy's sign is seen. These changes may be related to chronic cholecystitis. No gallstones are seen. Common bile duct: Diameter: 4.1 mm. Liver: Liver shows no focal mass. Mild ascites is seen. This may contribute to the changes of the gallbladder. Portal vein is patent on color Doppler imaging with normal direction of blood flow towards the liver. Other: None. IMPRESSION: Mild ascites. Gallbladder wall thickening with negative sonographic Murphy's sign. These changes may be related chronic cholecystitis although may be reactive from the underlying ascites. Electronically Signed   By: Inez Catalina M.D.   On: 05/19/2020 10:51  Assessment/Plan:  Assessment: Ms.  Temkin is a 39 yo F w/ a PMHx of ESRD on HD TTS, SLE, chronic systolic congestive heart failure (LVEF 45%, 01/2017) and polysubstance use who presents to the ED via PTAR from dialysis with infiltration of her dialysis fistula.   Plan by Problem:  Active Problems:   ESRD on HD TTS:   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HCC)   Hyperkalemia: 39 yo F w/ a PMHx of ESRD on HD TTS, SLE, chronic systolic congestive heart failure (LVEF 45%, 01/2017), HTN and polysubstance use who presents to the ED via PTAR from dialysis with infiltration of her dialysis fistula. Of note, an area of her fistula has failed to heal since a plication of an ulcer at that site back in December. Patient did report an episode of pustular discharge and some recent fatigue and nausea. Patient afebrile without a leukocytosis. Vascular evaluated patient and does not believe this to be due to infection and placed a tunneled dialysis catheter to allow the left arm to rest.  -tunneled cath placed -nephrology consulted; patient successfully dialyzed yesterday -K 4.7 today  Plan: -d/c vanc -resume outpatient dialysis schedule  Transaminitis: Elevated AST, ALT and alk phos noted incidentally on CMP.  These elevations are new.  Denies frequent alcohol use.  Denies IV drug use.  Has had nausea and fatigue but no abdominal pain.  Concern is for an autoimmune process given her history of lupus. -Right upper quadrant ultrasound shows some ascites, otherwise unremarkable -hepatitis A antibody IgM, hepatitis B core antibody IgM, hepatitis B surface antigen negative  Plan: -awaiting hepatitis B DNA ultra quantitative PCR, hepatitis B surface antibody,  -awaiting ANA, anti-smooth muscle antibody, antimicrosomal antibody,  hepatitis C antibody, IgG and mitochondrial antibodies -fu with PCP  SLE: -not currently on any DMARDs -previously seen in our clinic with poor follow up  -similar situation with rheumatology -per office note from 2015, pt has  never been able to get on a stable lupus regimen due to poor follow up -obtained ESR, CRP, anti dsDNA, complement C3 and C4 to assess disease status -ESR 80, normal CRP, C3 low, C4 normal and anti dsDNA pending  Plan: -fu dsDNA -fu w/ rheum outpatient   Chronic Systolic Congestive Heart Failure/HFrEF (LVEF 45%, 01/2017): HTN: -pt w/ hx of hypertension and HFrEF on coreg and dilt outpatient -has not been taking her coreg as she ran out; she probably hasn't taken it in around a month she believes -lungs clear and no LE swelling on exam  Plan: -continue home coreg and dilt  Dispo: Anticipated discharge pending clinical course.  Al Decant, MD 05/20/2020, 9:56 AM Pager: [phone]

## 2020-05-20 NOTE — Discharge Instructions (Signed)
You were admitted to the hospital for an infiltration of your fistula. Our vascular surgeons recommended the placement of a tunneled dialysis catheter to give your fistula time to heal. You underwent a procedure to have the tunneled catheter placed and were able to successfully undergo dialysis with it. We treated you with antibiotics for a possible infection of your fistula but we do not have concerns that it is infected and will not prescribe any antibiotics at discharge. While you were here we noted elevations in your liver enzymes suggesting inflammation or infection in your liver. We obtained testing for viral and autoimmune hepatitides. These results aren't all back yet. It will be very important to follow up with a primary care doctor to address these results. It will be very important to follow up with rheumatology as well to address your lupus. It was a pleasure meeting you.

## 2020-05-20 NOTE — TOC Transition Note (Signed)
Transition of Care [facility]) - CM/SW Discharge Note   Patient Details  Name: [name] MRN: [id number] Date of Birth: 03/05/1981  [facility] of [facility]) CM/SW Contact:  Bartholomew Crews, RN Phone Number: [phone] 05/20/2020, 10:35 AM   Clinical Narrative:     Notified by MD of patient needing PCP. Spoke with patient at the bedside with CSW. Discussed clinic options. Patient agreeable to going back to [place]. Appointment scheduled for Wednesday, June 9th. Patient regular HD TTS. Patient states that she will call her mom for transportation home. No further TOC needs identified.    Final next level of care: Home/Self Care Barriers to Discharge: No Barriers Identified   Patient Goals and CMS Choice Patient states their goals for this hospitalization and ongoing recovery are:: return home CMS Medicare.gov Compare Post Acute Care list provided to:: Patient Choice offered to / list presented to : NA  Discharge Placement                       Discharge Plan and Services In-house Referral: PCP / [facility], Clinical Social Work Discharge Planning Services: CM Consult Post Acute Care Choice: NA          DME Arranged: N/A DME Agency: NA       HH Arranged: NA HH Agency: NA        Social Determinants of Health (SDOH) Interventions     Readmission Risk Interventions No flowsheet data found.

## 2020-05-20 NOTE — Progress Notes (Addendum)
  Progress Note    05/20/2020 7:51 AM 1 Day Post-Op  Subjective:  Left TDC surgical site pain- sticking and pulling   Vitals:   05/19/20 2100 05/20/20 0448  BP: 125/73 120/84  Pulse: 85 79  Resp: 18 18  Temp: 98.3 F (36.8 C) 98 F (36.7 C)  SpO2: 95% 96%   Physical Exam: General: well appearing, well nourished, not in any distress Cardiac:  regular Lungs:  Non labored Incisions:  Left IJ TDC Extremities: left upper extremity fistula with good thrill and bruit, mildly tender to palpation, swelling around fistula Neurologic: alert and oriented  CBC    Component Value Date/Time   WBC 3.3 (L) 05/20/2020 0647   RBC 3.32 (L) 05/20/2020 0647   HGB 9.1 (L) 05/20/2020 0647   HCT 29.9 (L) 05/20/2020 0647   PLT 177 05/20/2020 0647   MCV 90.1 05/20/2020 0647   MCH 27.4 05/20/2020 0647   MCHC 30.4 05/20/2020 0647   RDW 17.3 (H) 05/20/2020 0647   LYMPHSABS 0.8 05/18/2020 0852   MONOABS 0.6 05/18/2020 0852   EOSABS 0.1 05/18/2020 0852   BASOSABS 0.0 05/18/2020 0852    BMET    Component Value Date/Time   NA 135 05/19/2020 1130   K 5.0 05/20/2020 0404   CL 93 (L) 05/19/2020 1130   CO2 26 05/19/2020 1130   GLUCOSE 95 05/19/2020 1130   BUN 85 (H) 05/19/2020 1130   CREATININE 11.43 (H) 05/19/2020 1130   CALCIUM 8.6 (L) 05/19/2020 1130   CALCIUM 6.7 (L) 01/31/2014 1309   GFRNONAA 4 (L) 05/19/2020 1130   GFRAA 4 (L) 05/19/2020 1130    INR    Component Value Date/Time   INR 1.14 06/30/2018 1011     Intake/Output Summary (Last 24 hours) at 05/20/2020 0751 Last data filed at 05/20/2020 0448 Gross per 24 hour  Intake 552.92 ml  Output 3024 ml  Net -2471.08 ml     Assessment/Plan:  39 y.o. female is s/p left IJ TDC 1 Day Post-Op. Having some surgical site pain. Dialyzed last evening without any difficulties.  Left arm feeling better. Continue to rest left arm    Karoline Caldwell, PA-C [facility] [phone] 05/20/2020 7:51 AM   I have  independently interviewed and examined patient and agree with PA assessment and plan above.   Deshone Lyssy C. Donzetta Matters, MD [facility] [place] Office: [phone] Pager: [phone]

## 2020-05-20 NOTE — Discharge Summary (Signed)
Name: [name] MRN: [id number] DOB: 1981-06-11 39 y.o. PCP: Patient, No Pcp Per  Date of Admission: 05/18/2020  7:46 AM Date of Discharge: 05/20/2020  Attending Physician: No att. providers found  Discharge Diagnosis:  1. AV Fistula Occlusion 2. ESRD on HD TTS 3. SLE 4. Transaminitis  Discharge Medications: Allergies as of 05/20/2020      Reactions   Tobramycin Sulfate Swelling   Eye swelling      Medication List    STOP taking these medications   hydroxychloroquine 200 MG tablet Commonly known as: PLAQUENIL     TAKE these medications   acetaminophen 500 MG tablet Commonly known as: TYLENOL Take by mouth.   calcium acetate 667 MG capsule Commonly known as: PHOSLO Take 1,334-2,668 mg by mouth See admin instructions. Take 4 capsule (2668 mg) by mouth three times a day with each meal and 2 capsules (1334 mg) with each snack   carvedilol 12.5 MG tablet Commonly known as: COREG Take 12.5 mg by mouth 2 (two) times daily. Take one tablet (12.5 mg) by mouth every morning, take one tablet (12.5 mg) at night on Sunday, Monday, Wednesday, Friday   diltiazem 30 MG tablet Commonly known as: CARDIZEM Take 1 tablet (30 mg total) by mouth every 6 (six) hours as needed (SVT). What changed: when to take this   diphenhydrAMINE 25 MG tablet Commonly known as: BENADRYL Take 25-50 mg by mouth See admin instructions. Take one tablet (25 mg) by mouth daily at bedtime, take two tablets (50 mg) immediately prior to dialysis - Tuesday, Thursday, Saturday   diphenhydramine-acetaminophen 25-500 MG Tabs tablet Commonly known as: TYLENOL PM Take 0.5-1 tablets by mouth at bedtime as needed (sleep).   ferric gluconate 62.5 mg in sodium chloride 0.9 % 100 mL Inject 62.5 mg into the vein every Thursday with hemodialysis.   hydrOXYzine 10 MG tablet Commonly known as: ATARAX/VISTARIL Take 1 tablet (10 mg total) by mouth 3 (three) times daily as needed for anxiety. What changed: reasons  to take this   iron sucrose in sodium chloride 0.9 % 100 mL Iron Sucrose (Venofer)   loperamide 2 MG tablet Commonly known as: IMODIUM A-D Take 4 mg by mouth See admin instructions. Take two tablest (4 mg) by mouth on dialysis days (Tuesday, Thursday, Saturday)   MIRCERA IJ Inject 1 Dose as directed every 14 (fourteen) days.   multivitamin Tabs tablet Take 1 tablet by mouth daily. What changed: when to take this   Renal Vitamin 0.8 MG Tabs Take 1 tablet by mouth daily. What changed: Another medication with the same name was changed. Make sure you understand how and when to take each.   NovaSource Renal Liqd Take 237 mLs by mouth See admin instructions. Take 1 container (237 mls) by at bedtime on dialysis days (Tuesday, Thursday, Saturday)   SENSIPAR PO Take 1 Dose by mouth daily.       Disposition and follow-up:   [name] was discharged from [facility] in Good condition.  At the hospital follow up visit please address:   1.  Please ensure patient able to be regularly dialyzed with new tunneled catheter. Please address any pain at new catheter site. Please assess AV fistula for infection/wound healing. Please follow up hepatitis labs as findings concerning for autoimmune hepatitis. May necessitate a GI referral. Please ensure follow up with rheumatology.   2.  Labs / imaging needed at time of follow-up: LFTs  3.  Pending labs/ test  needing follow-up: Hep B DNA, Ultraquant, PCR  Follow-up Appointments:   Hospital Course by problem list:  1. AV Fistula Occlusion 2. ESRD on HD TTS 39 yo F w/ a PMHx of ESRD on HD TTS, SLE, chronic systolic congestive heart failure (LVEF 45%, 01/2017), HTNand polysubstance use who presents to the ED via PTAR from dialysis with infiltration of her dialysis fistula. Of note, an area of her fistula has failed to heal since a plication of an ulcer at that site back in December. Patient did report an episode of pustular  discharge and some recent fatigue and nausea. Patient afebrile without a leukocytosis. Vascular evaluated patient and does not believe this to be due to infection and placed a tunneled dialysis catheter to allow the left arm to rest. Received vancomycin in the hospital for possible infection. Patient resumed dialysis inpatient. Discharged without antibiotics.  3. SLE -not currently on any DMARDs -previously seen in our clinic with poor follow up  -similar situation with rheumatology -per office note from 2015, pt has never been able to get on a stable lupus regimen due to poor follow up -obtained ESR, CRP, anti dsDNA, complement C3 and C4 to assess disease status; ESR elevated, CRP normal; C3 elevated, C4 normal; anti dsDNA normal -concern for autoimmune hepatitis below -needs fu w/ rheum outpatient   4. Transaminitis Elevated AST, ALT and alk phos noted incidentally on CMP. These elevations are new.  Patient denied frequent alcohol use or prior IV drug use.  Has had nausea and fatigue but no abdominal pain.  Concern is for an autoimmune process given her history of lupus. Right upper quadrant ultrasound showed some ascites, otherwise unremarkable. Hepatitis A antibody IgM, hepatitis B core antibody IgM, hepatitis B surface antigen, hepatitis B surface antibody negative and hepatitis C antibody. Awaiting hepatitis B DNA ultra quantitative PCR. -Antimicrosomal antibody and mitochondrial antibodies normal.  -ANA, IgG and anti-smooth muscle antibody positive. Anti-smooth muscle antibody strongly positive.  -fu with PCP and rheum; may require GI referral  Discharge Vitals:   BP 119/83 (BP Location: Right Arm)    Pulse 79    Temp 98.4 F (36.9 C) (Oral)    Resp 12    Ht '5\' 4"'$  (1.626 m)    Wt 47.4 kg Comment: stood to scale    SpO2 96%    BMI 17.94 kg/m   Pertinent Labs, Studies, and Procedures:   Results for orders placed or performed during the hospital encounter of 05/18/20 (from the past 168  hour(s))  Culture, blood (routine x 2)   Collection Time: 05/18/20  8:45 AM   Specimen: BLOOD  Result Value Ref Range   Specimen Description BLOOD RIGHT ANTECUBITAL    Special Requests      BOTTLES DRAWN AEROBIC AND ANAEROBIC Blood Culture adequate volume   Culture      NO GROWTH 2 DAYS Performed at [facility], [place] [address] [place], [place] [address]    Report Status PENDING   Culture, blood (routine x 2)   Collection Time: 05/18/20  8:50 AM   Specimen: BLOOD RIGHT HAND  Result Value Ref Range   Specimen Description BLOOD RIGHT HAND    Special Requests      BOTTLES DRAWN AEROBIC AND ANAEROBIC Blood Culture adequate volume   Culture      NO GROWTH 2 DAYS Performed at [facility], [place] [address], [place], [place] [address]    Report Status PENDING   CBC with Differential  Collection Time: 05/18/20  8:52 AM  Result Value Ref Range   WBC 4.7 4.0 - 10.5 K/uL   RBC 3.29 (L) 3.87 - 5.11 MIL/uL   Hemoglobin 8.9 (L) 12.0 - 15.0 g/dL   HCT 30.0 (L) 36.0 - 46.0 %   MCV 91.2 80.0 - 100.0 fL   MCH 27.1 26.0 - 34.0 pg   MCHC 29.7 (L) 30.0 - 36.0 g/dL   RDW 17.6 (H) 11.5 - 15.5 %   Platelets 194 150 - 400 K/uL   nRBC 0.0 0.0 - 0.2 %   Neutrophils Relative % 68 %   Neutro Abs 3.2 1.7 - 7.7 K/uL   Lymphocytes Relative 18 %   Lymphs Abs 0.8 0.7 - 4.0 K/uL   Monocytes Relative 12 %   Monocytes Absolute 0.6 0.1 - 1.0 K/uL   Eosinophils Relative 2 %   Eosinophils Absolute 0.1 0.0 - 0.5 K/uL   Basophils Relative 0 %   Basophils Absolute 0.0 0.0 - 0.1 K/uL   Immature Granulocytes 0 %   Abs Immature Granulocytes 0.01 0.00 - 0.07 K/uL  Basic metabolic panel   Collection Time: 05/18/20  8:52 AM  Result Value Ref Range   Sodium 139 135 - 145 mmol/L   Potassium 5.2 (H) 3.5 - 5.1 mmol/L   Chloride 93 (L) 98 - 111 mmol/L   CO2 30 22 - 32 mmol/L   Glucose, Bld 83 70 - 99 mg/dL   BUN 65 (H) 6 - 20 mg/dL   Creatinine, Ser 9.61 (H) 0.44 - 1.00 mg/dL   Calcium 9.8 8.9 -  10.3 mg/dL   GFR calc non Af Amer 5 (L) >60 mL/min   GFR calc Af Amer 5 (L) >60 mL/min   Anion gap 16 (H) 5 - 15  HIV Antibody (routine testing w rflx)   Collection Time: 05/18/20  8:52 AM  Result Value Ref Range   HIV Screen 4th Generation wRfx Non Reactive Non Reactive  SARS Coronavirus 2 by RT PCR (hospital order, performed in [facility] hospital lab) Nasopharyngeal Nasopharyngeal Swab   Collection Time: 05/18/20 11:35 AM   Specimen: Nasopharyngeal Swab  Result Value Ref Range   SARS Coronavirus 2 NEGATIVE NEGATIVE  Sedimentation rate   Collection Time: 05/18/20  1:56 PM  Result Value Ref Range   Sed Rate 80 (H) 0 - 22 mm/hr  C-reactive protein   Collection Time: 05/18/20  1:56 PM  Result Value Ref Range   CRP 0.9 <1.0 mg/dL  Anti-DNA antibody, double-stranded   Collection Time: 05/18/20  1:56 PM  Result Value Ref Range   ds DNA Ab 3 0 - 9 IU/mL  C3 complement   Collection Time: 05/18/20  1:56 PM  Result Value Ref Range   C3 Complement 58 (L) 82 - 167 mg/dL  C4 complement   Collection Time: 05/18/20  1:56 PM  Result Value Ref Range   Complement C4, Body Fluid 31 12 - 38 mg/dL  Surgical pcr screen   Collection Time: 05/18/20  8:00 PM   Specimen: Nasal Mucosa; Nasal Swab  Result Value Ref Range   MRSA, PCR NEGATIVE NEGATIVE   Staphylococcus aureus POSITIVE (A) NEGATIVE  CBC   Collection Time: 05/19/20  5:12 AM  Result Value Ref Range   WBC 3.1 (L) 4.0 - 10.5 K/uL   RBC 3.52 (L) 3.87 - 5.11 MIL/uL   Hemoglobin 9.5 (L) 12.0 - 15.0 g/dL   HCT 31.1 (L) 36.0 - 46.0 %   MCV 88.4 80.0 -  100.0 fL   MCH 27.0 26.0 - 34.0 pg   MCHC 30.5 30.0 - 36.0 g/dL   RDW 17.3 (H) 11.5 - 15.5 %   Platelets 179 150 - 400 K/uL   nRBC 0.0 0.0 - 0.2 %  Comprehensive metabolic panel   Collection Time: 05/19/20  5:12 AM  Result Value Ref Range   Sodium 134 (L) 135 - 145 mmol/L   Potassium 7.3 (HH) 3.5 - 5.1 mmol/L   Chloride 94 (L) 98 - 111 mmol/L   CO2 24 22 - 32 mmol/L   Glucose, Bld  92 70 - 99 mg/dL   BUN 84 (H) 6 - 20 mg/dL   Creatinine, Ser 11.30 (H) 0.44 - 1.00 mg/dL   Calcium 8.5 (L) 8.9 - 10.3 mg/dL   Total Protein 7.5 6.5 - 8.1 g/dL   Albumin 2.3 (L) 3.5 - 5.0 g/dL   AST 231 (H) 15 - 41 U/L   ALT 187 (H) 0 - 44 U/L   Alkaline Phosphatase 261 (H) 38 - 126 U/L   Total Bilirubin 1.1 0.3 - 1.2 mg/dL   GFR calc non Af Amer 4 (L) >60 mL/min   GFR calc Af Amer 4 (L) >60 mL/min   Anion gap 16 (H) 5 - 15  Glucose, capillary   Collection Time: 05/19/20  6:41 AM  Result Value Ref Range   Glucose-Capillary 82 70 - 99 mg/dL  I-STAT, chem 8   Collection Time: 05/19/20  7:52 AM  Result Value Ref Range   Sodium 132 (L) 135 - 145 mmol/L   Potassium 6.2 (H) 3.5 - 5.1 mmol/L   Chloride 94 (L) 98 - 111 mmol/L   BUN 80 (H) 6 - 20 mg/dL   Creatinine, Ser 12.70 (H) 0.44 - 1.00 mg/dL   Glucose, Bld 171 (H) 70 - 99 mg/dL   Calcium, Ion 1.06 (L) 1.15 - 1.40 mmol/L   TCO2 28 22 - 32 mmol/L   Hemoglobin 9.9 (L) 12.0 - 15.0 g/dL   HCT 29.0 (L) 36.0 - 99.8 %  Basic metabolic panel   Collection Time: 05/19/20  8:17 AM  Result Value Ref Range   Sodium 134 (L) 135 - 145 mmol/L   Potassium 6.5 (HH) 3.5 - 5.1 mmol/L   Chloride 93 (L) 98 - 111 mmol/L   CO2 26 22 - 32 mmol/L   Glucose, Bld 174 (H) 70 - 99 mg/dL   BUN 84 (H) 6 - 20 mg/dL   Creatinine, Ser 11.01 (H) 0.44 - 1.00 mg/dL   Calcium 8.4 (L) 8.9 - 10.3 mg/dL   GFR calc non Af Amer 4 (L) >60 mL/min   GFR calc Af Amer 5 (L) >60 mL/min   Anion gap 15 5 - 15  Glucose, capillary   Collection Time: 05/19/20  9:03 AM  Result Value Ref Range   Glucose-Capillary 27 (LL) 70 - 99 mg/dL   Comment 1 Notify RN   Glucose, capillary   Collection Time: 05/19/20  9:31 AM  Result Value Ref Range   Glucose-Capillary 139 (H) 70 - 99 mg/dL  Hepatitis B core antibody, IgM   Collection Time: 05/19/20 11:30 AM  Result Value Ref Range   Hep B C IgM NON REACTIVE NON REACTIVE  Hepatitis B surface antibody   Collection Time: 05/19/20  11:30 AM  Result Value Ref Range   Hepatitis B-Post 222.1 Immunity>9.9 mIU/mL  Hepatitis B surface antigen   Collection Time: 05/19/20 11:30 AM  Result Value Ref Range   Hepatitis  B Surface Ag NON REACTIVE NON REACTIVE  Hepatitis C antibody   Collection Time: 05/19/20 11:30 AM  Result Value Ref Range   HCV Ab NON REACTIVE NON REACTIVE  Hepatitis A antibody, IgM   Collection Time: 05/19/20 11:30 AM  Result Value Ref Range   Hep A IgM NON REACTIVE NON REACTIVE  Mitochondrial antibodies   Collection Time: 05/19/20 11:30 AM  Result Value Ref Range   Mitochondrial M2 Ab, IgG <20.0 0.0 - 20.0 Units  ANA   Collection Time: 05/19/20 11:30 AM  Result Value Ref Range   Anti Nuclear Antibody (ANA) Positive (A) Negative  Anti-smooth muscle antibody, IgG   Collection Time: 05/19/20 11:30 AM  Result Value Ref Range   F-Actin IgG 47 (H) 0 - 19 Units  IgG   Collection Time: 05/19/20 11:30 AM  Result Value Ref Range   IgG (Immunoglobin G), Serum 3,427 (H) 586 - 1,602 mg/dL  AntiMicrosomal Ab-Liver / Kidney   Collection Time: 05/19/20 11:30 AM  Result Value Ref Range   LKM1 Ab 1.5 0.0 - 20.0 Units  Comprehensive metabolic panel   Collection Time: 05/19/20 11:30 AM  Result Value Ref Range   Sodium 135 135 - 145 mmol/L   Potassium 6.4 (HH) 3.5 - 5.1 mmol/L   Chloride 93 (L) 98 - 111 mmol/L   CO2 26 22 - 32 mmol/L   Glucose, Bld 95 70 - 99 mg/dL   BUN 85 (H) 6 - 20 mg/dL   Creatinine, Ser 11.43 (H) 0.44 - 1.00 mg/dL   Calcium 8.6 (L) 8.9 - 10.3 mg/dL   Total Protein 8.5 (H) 6.5 - 8.1 g/dL   Albumin 2.5 (L) 3.5 - 5.0 g/dL   AST 188 (H) 15 - 41 U/L   ALT 180 (H) 0 - 44 U/L   Alkaline Phosphatase 278 (H) 38 - 126 U/L   Total Bilirubin 1.2 0.3 - 1.2 mg/dL   GFR calc non Af Amer 4 (L) >60 mL/min   GFR calc Af Amer 4 (L) >60 mL/min   Anion gap 16 (H) 5 - 15  Potassium   Collection Time: 05/19/20  6:44 PM  Result Value Ref Range   Potassium 3.8 3.5 - 5.1 mmol/L  Potassium   Collection  Time: 05/19/20 10:39 PM  Result Value Ref Range   Potassium 4.6 3.5 - 5.1 mmol/L  Potassium   Collection Time: 05/20/20  4:04 AM  Result Value Ref Range   Potassium 5.0 3.5 - 5.1 mmol/L  Comprehensive metabolic panel   Collection Time: 05/20/20  6:47 AM  Result Value Ref Range   Sodium 136 135 - 145 mmol/L   Potassium 4.7 3.5 - 5.1 mmol/L   Chloride 99 98 - 111 mmol/L   CO2 23 22 - 32 mmol/L   Glucose, Bld 113 (H) 70 - 99 mg/dL   BUN 47 (H) 6 - 20 mg/dL   Creatinine, Ser 6.61 (H) 0.44 - 1.00 mg/dL   Calcium 7.8 (L) 8.9 - 10.3 mg/dL   Total Protein 7.5 6.5 - 8.1 g/dL   Albumin 2.3 (L) 3.5 - 5.0 g/dL   AST 85 (H) 15 - 41 U/L   ALT 107 (H) 0 - 44 U/L   Alkaline Phosphatase 214 (H) 38 - 126 U/L   Total Bilirubin 0.5 0.3 - 1.2 mg/dL   GFR calc non Af Amer 7 (L) >60 mL/min   GFR calc Af Amer 9 (L) >60 mL/min   Anion gap 14 5 - 15  CBC  Collection Time: 05/20/20  6:47 AM  Result Value Ref Range   WBC 3.3 (L) 4.0 - 10.5 K/uL   RBC 3.32 (L) 3.87 - 5.11 MIL/uL   Hemoglobin 9.1 (L) 12.0 - 15.0 g/dL   HCT 29.9 (L) 36.0 - 46.0 %   MCV 90.1 80.0 - 100.0 fL   MCH 27.4 26.0 - 34.0 pg   MCHC 30.4 30.0 - 36.0 g/dL   RDW 17.3 (H) 11.5 - 15.5 %   Platelets 177 150 - 400 K/uL   nRBC 0.0 0.0 - 0.2 %  Glucose, capillary   Collection Time: 05/20/20  7:53 AM  Result Value Ref Range   Glucose-Capillary 73 70 - 99 mg/dL    Discharge Instructions: Discharge Instructions    Activity as tolerated - No restrictions   Complete by: As directed    Ambulatory referral to Rheumatology   Complete by: As directed    Diet - low sodium heart healthy   Complete by: As directed       Signed: Al Decant, MD 05/21/2020, 7:44 AM   Pager: [phone]

## 2020-05-20 NOTE — Plan of Care (Signed)

## 2020-05-21 LAB — HEPATITIS B DNA, ULTRAQUANTITATIVE, PCR
HBV DNA SERPL PCR-ACNC: NOT DETECTED IU/mL
HBV DNA SERPL PCR-LOG IU: UNDETERMINED log10 IU/mL

## 2020-05-23 LAB — CULTURE, BLOOD (ROUTINE X 2)
Culture: NO GROWTH
Culture: NO GROWTH
Special Requests: ADEQUATE
Special Requests: ADEQUATE

## 2020-06-05 ENCOUNTER — Inpatient Hospital Stay (INDEPENDENT_AMBULATORY_CARE_PROVIDER_SITE_OTHER): Payer: Medicaid Other | Admitting: Primary Care

## 2020-06-10 ENCOUNTER — Other Ambulatory Visit: Payer: Self-pay | Admitting: *Deleted

## 2020-06-10 DIAGNOSIS — N186 End stage renal disease: Secondary | ICD-10-CM

## 2020-06-14 ENCOUNTER — Ambulatory Visit: Payer: Medicaid Other

## 2020-06-14 ENCOUNTER — Ambulatory Visit (HOSPITAL_COMMUNITY)
Admission: RE | Admit: 2020-06-14 | Discharge: 2020-06-14 | Disposition: A | Discharge status: Home / Self Care | Payer: Medicaid Other | Source: Ambulatory Visit | Attending: Vascular Surgery | Admitting: Vascular Surgery

## 2020-06-14 ENCOUNTER — Other Ambulatory Visit: Payer: Self-pay

## 2020-06-14 DIAGNOSIS — Z992 Dependence on renal dialysis: Secondary | ICD-10-CM

## 2020-06-14 DIAGNOSIS — N186 End stage renal disease: Secondary | ICD-10-CM | POA: Diagnosis not present

## 2020-06-19 ENCOUNTER — Ambulatory Visit (INDEPENDENT_AMBULATORY_CARE_PROVIDER_SITE_OTHER): Payer: Medicaid Other | Admitting: Physician Assistant

## 2020-06-19 ENCOUNTER — Other Ambulatory Visit: Payer: Self-pay

## 2020-06-19 VITALS — BP 138/88 | HR 90 | Temp 98.1°F | Resp 20 | Ht 64.0 in | Wt 105.3 lb

## 2020-06-19 DIAGNOSIS — N186 End stage renal disease: Secondary | ICD-10-CM | POA: Diagnosis not present

## 2020-06-19 DIAGNOSIS — T82898D Other specified complication of vascular prosthetic devices, implants and grafts, subsequent encounter: Secondary | ICD-10-CM | POA: Diagnosis not present

## 2020-06-19 DIAGNOSIS — Z992 Dependence on renal dialysis: Secondary | ICD-10-CM

## 2020-06-19 NOTE — Progress Notes (Signed)
POST OPERATIVE DIALYSIS ACCESS OFFICE NOTE    CC:  F/u for dialysis access complication HPI:  This is a 39 y.o. female who is s/p left BVT with subsequent plication last December. She presented to the ED in May with an area of skin compromise and infiltration of her fistula. It did not appear infected nor was there ongoing bleeding or expanding hematoma. Her BC were negative X 2 after 5 days. She did not have fever or leukocytosis at that time. Dr. Donzetta Matters placed a left IJ TDC on 05/19/2020 in order to avoid using her fistula until these issues resolved.  She denies hand pain, fever or chills.  She states there are no problems with her catheter during treatment.  Dialysis days:  TTS   Dialysis center: [facility]  Allergies  Allergen Reactions  . Tobramycin Sulfate Swelling    Eye swelling    Current Outpatient Medications  Medication Sig Dispense Refill  . acetaminophen (TYLENOL) 500 MG tablet Take by mouth.    . B Complex-C-Folic Acid (RENAL VITAMIN) 0.8 MG TABS Take 1 tablet by mouth daily.     . calcium acetate (PHOSLO) 667 MG capsule Take 1,334-2,668 mg by mouth See admin instructions. Take 4 capsule (2668 mg) by mouth three times a day with each meal and 2 capsules (1334 mg) with each snack    . carvedilol (COREG) 12.5 MG tablet Take 12.5 mg by mouth 2 (two) times daily. Take one tablet (12.5 mg) by mouth every morning, take one tablet (12.5 mg) at night on Sunday, Monday, Wednesday, Friday    . Cinacalcet HCl (SENSIPAR PO) Take 1 Dose by mouth daily.    Marland Kitchen diltiazem (CARDIZEM) 30 MG tablet Take 1 tablet (30 mg total) by mouth every 6 (six) hours as needed (SVT). (Patient taking differently: Take 30 mg by mouth 2 (two) times daily as needed (SVT). ) 60 tablet 1  . diphenhydrAMINE (BENADRYL) 25 MG tablet Take 25-50 mg by mouth See admin instructions. Take one tablet (25 mg) by mouth daily at bedtime, take two tablets (50 mg) immediately prior to dialysis - Tuesday, Thursday, Saturday     . diphenhydramine-acetaminophen (TYLENOL PM) 25-500 MG TABS tablet Take 0.5-1 tablets by mouth at bedtime as needed (sleep).    . ferric gluconate 62.5 mg in sodium chloride 0.9 % 100 mL Inject 62.5 mg into the vein every Thursday with hemodialysis.    . hydrOXYzine (ATARAX/VISTARIL) 10 MG tablet Take 1 tablet (10 mg total) by mouth 3 (three) times daily as needed for anxiety. (Patient taking differently: Take 10 mg by mouth 3 (three) times daily as needed for itching or anxiety. ) 5 tablet 0  . iron sucrose in sodium chloride 0.9 % 100 mL Iron Sucrose (Venofer)    . loperamide (IMODIUM A-D) 2 MG tablet Take 4 mg by mouth See admin instructions. Take two tablest (4 mg) by mouth on dialysis days (Tuesday, Thursday, Saturday)    . Methoxy PEG-Epoetin Beta (MIRCERA IJ) Inject 1 Dose as directed every 14 (fourteen) days.    . multivitamin (RENA-VIT) TABS tablet Take 1 tablet by mouth daily. (Patient taking differently: Take 1 tablet by mouth at bedtime. ) 30 tablet 0  . Nutritional Supplements (NOVASOURCE RENAL) LIQD Take 237 mLs by mouth See admin instructions. Take 1 container (237 mls) by at bedtime on dialysis days (Tuesday, Thursday, Saturday)     No current facility-administered medications for this visit.     ROS:  See HPI  There were  no vitals taken for this visit.   Physical Exam:  General appearance: Thin, well-developed female no apparent distress Cardiac: Rate and rhythm are regular Respiratory: Nonlabored Extremities: Left upper extremity is inspected.  She has a 6-0 Prolene not that has eroded through her skin.  There is no bleeding, skin opening or drainage.  She has a good bruit and thrill in her fistula.  2+ radial pulse.  5 out of 5 hand grip strength    Dialysis duplex on 06/14/2020 Findings:  +--------------------+----------+-----------------+--------------+  AVF         PSV (cm/s)Flow Vol (mL/min)  Comments      +--------------------+----------+-----------------+--------------+  Native artery inflow  118      959             +--------------------+----------+-----------------+--------------+  AVF Anastomosis     694           retained valve  +--------------------+----------+-----------------+--------------+     +------------+----------+-------------+----------+-------------------+  OUTFLOW VEINPSV (cm/s)Diameter (cm)Depth (cm)   Describe     +------------+----------+-------------+----------+-------------------+  Prox UA     78    1.56     0.19  partially-occlusive  +------------+----------+-------------+----------+-------------------+  Mid UA     224    0.74     0.30  partially-occlusive  +------------+----------+-------------+----------+-------------------+  Dist UA     480    1.40     0.27  partially-occlusive  +------------+----------+-------------+----------+-------------------+  AC Fossa    275    1.52     0.26  partially-occlusive  +------------+----------+-------------+----------+-------------------+    Summary:  Patent arteriovenous fistula with partially occlusive and calcified  thrombus noted throughout the basilic outflow vein.   Assessment/Plan: Status post infiltration and skin breakdown of her left upper extremity basilic vein fistula.  Prolene suture clipped.  May begin using her fistula for access.  Once her nephrology team is satisfied with function of her fistula, please notify our office to make arrangements for removal of her tunneled dialysis catheter.  Barbie Banner, PA-C 06/19/2020 9:03 AM [facility] [phone]  Clinic MD:  Scot Dock

## 2020-07-25 ENCOUNTER — Other Ambulatory Visit: Payer: Self-pay

## 2020-07-25 ENCOUNTER — Encounter (HOSPITAL_COMMUNITY): Payer: Self-pay

## 2020-07-25 ENCOUNTER — Emergency Department (HOSPITAL_COMMUNITY)
Admission: EM | Admit: 2020-07-25 | Discharge: 2020-07-25 | Disposition: A | Discharge status: Home / Self Care | Payer: Medicaid Other | Attending: Emergency Medicine | Admitting: Emergency Medicine

## 2020-07-25 DIAGNOSIS — I120 Hypertensive chronic kidney disease with stage 5 chronic kidney disease or end stage renal disease: Secondary | ICD-10-CM | POA: Insufficient documentation

## 2020-07-25 DIAGNOSIS — X58XXXA Exposure to other specified factors, initial encounter: Secondary | ICD-10-CM | POA: Diagnosis not present

## 2020-07-25 DIAGNOSIS — Z992 Dependence on renal dialysis: Secondary | ICD-10-CM | POA: Diagnosis not present

## 2020-07-25 DIAGNOSIS — Y939 Activity, unspecified: Secondary | ICD-10-CM | POA: Insufficient documentation

## 2020-07-25 DIAGNOSIS — N186 End stage renal disease: Secondary | ICD-10-CM | POA: Insufficient documentation

## 2020-07-25 DIAGNOSIS — Y929 Unspecified place or not applicable: Secondary | ICD-10-CM | POA: Insufficient documentation

## 2020-07-25 DIAGNOSIS — Y999 Unspecified external cause status: Secondary | ICD-10-CM | POA: Diagnosis not present

## 2020-07-25 DIAGNOSIS — T82838A Hemorrhage of vascular prosthetic devices, implants and grafts, initial encounter: Secondary | ICD-10-CM | POA: Insufficient documentation

## 2020-07-25 DIAGNOSIS — F17210 Nicotine dependence, cigarettes, uncomplicated: Secondary | ICD-10-CM | POA: Insufficient documentation

## 2020-07-25 LAB — BASIC METABOLIC PANEL
Anion gap: 20 — ABNORMAL HIGH (ref 5–15)
BUN: 89 mg/dL — ABNORMAL HIGH (ref 6–20)
CO2: 21 mmol/L — ABNORMAL LOW (ref 22–32)
Calcium: 9.2 mg/dL (ref 8.9–10.3)
Chloride: 94 mmol/L — ABNORMAL LOW (ref 98–111)
Creatinine, Ser: 12.28 mg/dL — ABNORMAL HIGH (ref 0.44–1.00)
GFR calc Af Amer: 4 mL/min — ABNORMAL LOW (ref >60)
GFR calc non Af Amer: 3 mL/min — ABNORMAL LOW (ref >60)
Glucose, Bld: 83 mg/dL (ref 70–99)
Potassium: 4.6 mmol/L (ref 3.5–5.1)
Sodium: 135 mmol/L (ref 135–145)

## 2020-07-25 LAB — CBC
HCT: 34.5 % — ABNORMAL LOW (ref 36.0–46.0)
Hemoglobin: 10.2 g/dL — ABNORMAL LOW (ref 12.0–15.0)
MCH: 26.4 pg (ref 26.0–34.0)
MCHC: 29.6 g/dL — ABNORMAL LOW (ref 30.0–36.0)
MCV: 89.4 fL (ref 80.0–100.0)
Platelets: 189 10*3/uL (ref 150–400)
RBC: 3.86 MIL/uL — ABNORMAL LOW (ref 3.87–5.11)
RDW: 19.9 % — ABNORMAL HIGH (ref 11.5–15.5)
WBC: 5 10*3/uL (ref 4.0–10.5)
nRBC: 0.6 % — ABNORMAL HIGH (ref 0.0–0.2)

## 2020-07-25 NOTE — ED Provider Notes (Signed)
[facility] EMERGENCY DEPARTMENT Provider Note   CSN: [id number] Arrival date & time: 07/25/20  0242     History Chief Complaint  Patient presents with  . Vascular Access Problem    [name] is a 39 y.o. female.  Patient presents to the emergency department for evaluation of bleeding from her left upper arm dialysis fistula.  Patient reports that she has been having trouble with the fistula for some time, has a temporary dialysis catheter in her left chest.  They did attempt to use her fistula 1 week ago at dialysis but it was unsuccessful.  It has not been accessed since then.  Patient reports that she started bleeding after getting out of the shower tonight.  Bleeding lasted for approximately 5 minutes and then EMS has been applying direct pressure ever since.        Past Medical History:  Diagnosis Date  . Anemia    low iron - receives iron at dialysis  . Chronic systolic congestive heart failure ([place]) 03/16/2016  . Dyspnea   . ESRD (end stage renal disease) ([place])    [facility] TTHSAT _ [place]  . H/O pericarditis 01/17/2013  . H/O pleural effusion 01/17/2013  . Heart murmur   . Lupus (systemic lupus erythematosus) (H[facility])    Previously followed with Dr. Charlestine Night, has not followed up recently  . 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 Pneumonia    many times  . Polysubstance abuse ([place])    cocaine, MJ, tobacco  . S/P pericardiocentesis 01/17/2013   H/o pericardial effusion with tamponade 2006   . Seizures ([place])    during pregnancy 1 time  . Streptococcal bacteremia 01/23/2013   She had two S. pneumonae bacteremia on 01/21/2013. Sensitive to Peniccilin     Patient Active Problem List   Diagnosis Date Noted  . 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 Hyperkalemia 07/29/2018  . SVT (supraventricular  tachycardia) ([place]) 07/16/2018  . ESRD (end stage renal disease) on dialysis ([place]) 02/17/2017  . Chronic systolic congestive heart failure ([place]) 03/16/2016  . Other hypervolemia   . S/P thoracentesis   . Cough with hemoptysis   . Myalgia   . Pulmonary edema   . Rash and nonspecific skin eruption 06/26/2015  . Secondary Raynaud's phenomenon 06/24/2015  . Cramping of hands 04/19/2014  . Unspecified contraceptive management 04/19/2014  . End stage renal disease ([place]) 03/27/2014  . Insomnia 03/15/2014  . Benign hypertension with ESRD (end-stage renal disease) ([place]) 03/15/2014  . Tobacco abuse 02/15/2014  . Healthcare maintenance 02/15/2014  . Hypoalbuminemia 02/01/2014  . Nephrotic syndrome 02/01/2014  . ESRD on dialysis ([place]) 01/31/2014  . Pleural effusion, left 01/31/2014  . Microcytic anemia 01/29/2014  . Hypocalcemia 01/29/2014  . Cocaine abuse ([name]) 01/18/2013  . Marijuana smoker ([place]) 01/18/2013  . H/O pericarditis 01/17/2013  . SLE (systemic lupus erythematosus) ([place]) 01/17/2013  . 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 S/P pericardiocentesis 01/17/2013  . H/O pleural effusion 01/17/2013  . Nephrosis 01/17/2013  . Preseptal cellulitis 01/17/2013    Past Surgical History:  Procedure Laterality Date  . AV FISTULA PLACEMENT    . BASCILIC VEIN TRANSPOSITION Left 02/05/2014   Procedure: [place];  Surgeon: [name], MD;  Location: [facility];  Service: Vascular;  Laterality: Left;  . FISTULA SUPERFICIALIZATION Left 05/30/2018   Procedure: FISTULA PLICATION  BASILIC VEIN TRANSPOSITION;  Surgeon: Angelia Mould, MD;  Location: [facility];  Service: Vascular;  Laterality: Left;  . FISTULA SUPERFICIALIZATION Left 54/65/6812   Procedure: PLICATION OF LEFT ARTERIOVENOUS FISTULA ULCER;  Surgeon: [name], MD;  Location: [facility];  Service: Vascular;  Laterality: Left;  . INSERTION OF DIALYSIS CATHETER N/A 05/19/2020   Procedure: TUNNELED INSERTION  OF DIALYSIS  CATHETER;  Surgeon: Waynetta Sandy, MD;  Location: [facility];  Service: Vascular;  Laterality: N/A;  . VENOGRAM Right 01/31/2014   Procedure: DIALYSIS CATHETER;  Surgeon: Serafina Mitchell, MD;  Location: [facility] CATH LAB;  Service: Cardiovascular;  Laterality: Right;     OB History   No obstetric history on file.     History reviewed. No pertinent family history.  Social History   Tobacco Use  . Smoking status: Current Every Day Smoker    Packs/day: 0.12    Years: 15.00    Pack years: 1.80    Types: Cigarettes  . Smokeless tobacco: Never Used  . Tobacco comment: 4 cigarettes per day  Vaping Use  . Vaping Use: Never used  Substance Use Topics  . Alcohol use: Yes    Alcohol/week: 0.0 standard drinks    Comment: Special Occasional takes Vicar  . Drug use: Yes    Frequency: 5.0 times per week    Types: Marijuana    Comment: Marijuana and cocaine used last - Thanksgiving 2020    Home Medications Prior to Admission medications   Medication Sig Start Date End Date Taking? Authorizing Provider  acetaminophen (TYLENOL) 500 MG tablet Take by mouth. 07/20/18   [provider]  B Complex-C-Folic Acid (RENAL VITAMIN) 0.8 MG TABS Take 1 tablet by mouth daily.  10/03/19   [provider]  calcium acetate (PHOSLO) 667 MG capsule Take 1,334-2,668 mg by mouth See admin instructions. Take 4 capsule (2668 mg) by mouth three times a day with each meal and 2 capsules (1334 mg) with each snack    [provider]  carvedilol (COREG) 12.5 MG tablet Take 12.5 mg by mouth 2 (two) times daily. Take one tablet (12.5 mg) by mouth every morning, take one tablet (12.5 mg) at night on Sunday, Monday, Wednesday, Friday 11/07/19   [provider]  Cinacalcet HCl (SENSIPAR PO) Take 1 Dose by mouth daily.    [provider]  diltiazem (CARDIZEM) 30 MG tablet Take 1 tablet (30 mg total) by mouth every 6 (six) hours as needed (SVT). Patient taking differently: Take 30 mg  by mouth 2 (two) times daily as needed (SVT).  07/19/18   Alphonzo Grieve, MD  diphenhydrAMINE (BENADRYL) 25 MG tablet Take 25-50 mg by mouth See admin instructions. Take one tablet (25 mg) by mouth daily at bedtime, take two tablets (50 mg) immediately prior to dialysis - Tuesday, Thursday, Saturday    [provider]  diphenhydramine-acetaminophen (TYLENOL PM) 25-500 MG TABS tablet Take 0.5-1 tablets by mouth at bedtime as needed (sleep).    [provider]  heparin 1000 unit/mL SOLN injection Heparin Sodium (Porcine) 1,000 Units/mL Catheter Lock Venous 05/21/20 06/05/21  [provider]  hydrOXYzine (ATARAX/VISTARIL) 10 MG tablet Take 1 tablet (10 mg total) by mouth 3 (three) times daily as needed for anxiety. Patient taking differently: Take 10 mg by mouth 3 (three) times daily as needed for itching or anxiety.  07/29/19   Caccavale, Sophia, PA-C  iron sucrose in sodium chloride 0.9 % 100 mL Iron Sucrose (Venofer) 06/13/20 06/12/21  [provider]  loperamide (IMODIUM A-D) 2 MG tablet Take 4 mg by mouth See admin instructions. Take two tablest (4 mg) by mouth on dialysis days (Tuesday, Thursday, Saturday)    [provider]  Methoxy PEG-Epoetin Beta (MIRCERA IJ) Inject 1 Dose as directed every 14 (fourteen) days. 03/16/20 03/15/21  [provider]  multivitamin (RENA-VIT) TABS tablet Take 1 tablet by mouth daily. Patient taking differently: Take 1 tablet by mouth at bedtime.  02/18/17   Holley Raring, MD  Nutritional Supplements (NOVASOURCE RENAL) LIQD Take 237 mLs by mouth See admin instructions. Take 1 container (237 mls) by at bedtime on dialysis days (Tuesday, Thursday, Saturday)    [provider]    Allergies    Tobramycin sulfate  Review of Systems   Review of Systems  Respiratory: Negative for shortness of breath.   Cardiovascular: Negative for chest pain and palpitations.  Skin: Positive for wound.  Neurological: Negative for  syncope.  All other systems reviewed and are negative.   Physical Exam Updated Vital Signs BP (!) 138/82   Pulse 93   Temp 98.3 F (36.8 C) (Oral)   Resp 19   Ht 5\' 5"  (1.651 m)   Wt 45.3 kg   SpO2 96%   BMI 16.62 kg/m   Physical Exam Vitals and nursing note reviewed.  Constitutional:      General: She is not in acute distress.    Appearance: Normal appearance. She is well-developed.  HENT:     Head: Normocephalic and atraumatic.     Right Ear: Hearing normal.     Left Ear: Hearing normal.     Nose: Nose normal.  Eyes:     Conjunctiva/sclera: Conjunctivae normal.     Pupils: Pupils are equal, round, and reactive to light.  Cardiovascular:     Rate and Rhythm: Regular rhythm.     Heart sounds: S1 normal and S2 normal. No murmur heard.  No friction rub. No gallop.   Pulmonary:     Effort: Pulmonary effort is normal. No respiratory distress.     Breath sounds: Normal breath sounds.  Chest:     Chest wall: No tenderness.  Abdominal:     General: Bowel sounds are normal.     Palpations: Abdomen is soft.     Tenderness: There is no abdominal tenderness. There is no guarding or rebound. Negative signs include Murphy's sign and McBurney's sign.     Hernia: No hernia is present.  Musculoskeletal:        General: Normal range of motion.     Cervical back: Normal range of motion and neck supple.     Comments: Fistula present left upper arm  Skin:    General: Skin is warm and dry.     Findings: No rash.     Comments: 5 mm ulcerated area over dialysis fistula left upper arm, clot within ulcerated area, no active bleeding  Neurological:     Mental Status: She is alert and oriented to person, place, and time.     GCS: GCS eye subscore is 4. GCS verbal subscore is 5. GCS motor subscore is 6.     Cranial Nerves: No cranial nerve deficit.     Sensory: No sensory deficit.     Coordination: Coordination normal.  Psychiatric:        Speech: Speech normal.        Behavior:  Behavior normal.        Thought Content: Thought content normal.  ED Results / Procedures / Treatments   Labs (all labs ordered are listed, but only abnormal results are displayed) Labs Reviewed  CBC - Abnormal; Notable for the following components:      Result Value   RBC 3.86 (*)    Hemoglobin 10.2 (*)    HCT 34.5 (*)    MCHC 29.6 (*)    RDW 19.9 (*)    nRBC 0.6 (*)    All other components within normal limits  BASIC METABOLIC PANEL - Abnormal; Notable for the following components:   Chloride 94 (*)    CO2 21 (*)    BUN 89 (*)    Creatinine, Ser 12.28 (*)    GFR calc non Af Amer 3 (*)    GFR calc Af Amer 4 (*)    Anion gap 20 (*)    All other components within normal limits    EKG None  Radiology No results found.  Procedures Procedures (including critical care time)  Medications Ordered in ED Medications - No data to display  ED Course  I have reviewed the triage vital signs and the nursing notes.  Pertinent labs & imaging results that were available during my care of the patient were reviewed by me and considered in my medical decision making (see chart for details).    MDM Rules/Calculators/A&P                          Patient had spontaneous bleeding from an ulcerated area on her dialysis fistula site on the upper arm.  EMS had applied direct pressure for the transport and at arrival to the ER area was no longer bleeding.  Dressing was placed over the wound and the patient monitored, no further bleeding.  She can follow-up with vascular surgery as an outpatient.  Final Clinical Impression(s) / ED Diagnoses Final diagnoses:  Bleeding from dialysis shunt, initial encounter [facility])    Rx / DC Orders ED Discharge Orders    None       Kenyan Karnes, Gwenyth Allegra, MD 07/25/20 [phone]

## 2020-07-25 NOTE — ED Triage Notes (Signed)
Patient arrived EMS for bleeding fistula to left arm. Started to bleed after taking a shower for 5 minutes. Bleeding not controlled, pinpoint pressure applied. Dialysis MWF  148/90 HR 98 Spo2 99% RA

## 2020-07-25 NOTE — ED Notes (Signed)
The pt is alert she has had a bandage placed  She reports that she was in the shower and her bandage became wet and then the wound started bleeding.  She has been dialyzed through the dialysis catheter in her lt upper chest

## 2020-07-25 NOTE — Discharge Instructions (Addendum)
Slight pressure in dressing on the area for today.  Have your dialysis center schedule you for follow-up with vascular surgery to recheck the fistula.  Return if you have any further bleeding.

## 2020-07-28 ENCOUNTER — Observation Stay (HOSPITAL_COMMUNITY): Payer: Medicaid Other | Admitting: Certified Registered Nurse Anesthetist

## 2020-07-28 ENCOUNTER — Emergency Department (HOSPITAL_COMMUNITY): Payer: Medicaid Other

## 2020-07-28 ENCOUNTER — Other Ambulatory Visit: Payer: Self-pay

## 2020-07-28 ENCOUNTER — Encounter (HOSPITAL_COMMUNITY)
Admission: EM | Disposition: A | Discharge status: Home / Self Care | Payer: Self-pay | Source: Home / Self Care | Attending: Internal Medicine

## 2020-07-28 ENCOUNTER — Inpatient Hospital Stay (HOSPITAL_COMMUNITY)
Admission: EM | Admit: 2020-07-28 | Discharge: 2020-08-02 | DRG: 252 | Disposition: A | Discharge status: Home / Self Care | Payer: Medicaid Other | Attending: Internal Medicine | Admitting: Internal Medicine

## 2020-07-28 ENCOUNTER — Encounter (HOSPITAL_COMMUNITY): Payer: Self-pay | Admitting: Emergency Medicine

## 2020-07-28 DIAGNOSIS — I440 Atrioventricular block, first degree: Secondary | ICD-10-CM | POA: Diagnosis present

## 2020-07-28 DIAGNOSIS — T829XXS Unspecified complication of cardiac and vascular prosthetic device, implant and graft, sequela: Secondary | ICD-10-CM

## 2020-07-28 DIAGNOSIS — I4510 Unspecified right bundle-branch block: Secondary | ICD-10-CM | POA: Diagnosis present

## 2020-07-28 DIAGNOSIS — T8241XA Breakdown (mechanical) of vascular dialysis catheter, initial encounter: Secondary | ICD-10-CM | POA: Diagnosis present

## 2020-07-28 DIAGNOSIS — F1290 Cannabis use, unspecified, uncomplicated: Secondary | ICD-10-CM | POA: Diagnosis present

## 2020-07-28 DIAGNOSIS — E871 Hypo-osmolality and hyponatremia: Secondary | ICD-10-CM | POA: Diagnosis present

## 2020-07-28 DIAGNOSIS — I10 Essential (primary) hypertension: Secondary | ICD-10-CM | POA: Diagnosis present

## 2020-07-28 DIAGNOSIS — F1910 Other psychoactive substance abuse, uncomplicated: Secondary | ICD-10-CM | POA: Diagnosis present

## 2020-07-28 DIAGNOSIS — N2581 Secondary hyperparathyroidism of renal origin: Secondary | ICD-10-CM | POA: Diagnosis present

## 2020-07-28 DIAGNOSIS — Z9115 Patient's noncompliance with renal dialysis: Secondary | ICD-10-CM

## 2020-07-28 DIAGNOSIS — E875 Hyperkalemia: Secondary | ICD-10-CM | POA: Diagnosis present

## 2020-07-28 DIAGNOSIS — D631 Anemia in chronic kidney disease: Secondary | ICD-10-CM | POA: Diagnosis present

## 2020-07-28 DIAGNOSIS — N186 End stage renal disease: Secondary | ICD-10-CM

## 2020-07-28 DIAGNOSIS — E876 Hypokalemia: Secondary | ICD-10-CM | POA: Diagnosis present

## 2020-07-28 DIAGNOSIS — Z20822 Contact with and (suspected) exposure to covid-19: Secondary | ICD-10-CM | POA: Diagnosis present

## 2020-07-28 DIAGNOSIS — I5042 Chronic combined systolic (congestive) and diastolic (congestive) heart failure: Secondary | ICD-10-CM | POA: Diagnosis not present

## 2020-07-28 DIAGNOSIS — F1490 Cocaine use, unspecified, uncomplicated: Secondary | ICD-10-CM | POA: Diagnosis present

## 2020-07-28 DIAGNOSIS — M329 Systemic lupus erythematosus, unspecified: Secondary | ICD-10-CM | POA: Diagnosis present

## 2020-07-28 DIAGNOSIS — Z79899 Other long term (current) drug therapy: Secondary | ICD-10-CM

## 2020-07-28 DIAGNOSIS — I132 Hypertensive heart and chronic kidney disease with heart failure and with stage 5 chronic kidney disease, or end stage renal disease: Secondary | ICD-10-CM | POA: Diagnosis present

## 2020-07-28 DIAGNOSIS — T82838A Hemorrhage of vascular prosthetic devices, implants and grafts, initial encounter: Principal | ICD-10-CM | POA: Diagnosis present

## 2020-07-28 DIAGNOSIS — Z888 Allergy status to other drugs, medicaments and biological substances status: Secondary | ICD-10-CM

## 2020-07-28 DIAGNOSIS — T82898A Other specified complication of vascular prosthetic devices, implants and grafts, initial encounter: Secondary | ICD-10-CM | POA: Diagnosis not present

## 2020-07-28 DIAGNOSIS — Z992 Dependence on renal dialysis: Secondary | ICD-10-CM

## 2020-07-28 DIAGNOSIS — F17210 Nicotine dependence, cigarettes, uncomplicated: Secondary | ICD-10-CM | POA: Diagnosis present

## 2020-07-28 DIAGNOSIS — I5043 Acute on chronic combined systolic (congestive) and diastolic (congestive) heart failure: Secondary | ICD-10-CM | POA: Diagnosis present

## 2020-07-28 DIAGNOSIS — Y832 Surgical operation with anastomosis, bypass or graft as the cause of abnormal reaction of the patient, or of later complication, without mention of misadventure at the time of the procedure: Secondary | ICD-10-CM | POA: Diagnosis present

## 2020-07-28 DIAGNOSIS — R509 Fever, unspecified: Secondary | ICD-10-CM

## 2020-07-28 HISTORY — PX: THROMBECTOMY AND REVISION OF ARTERIOVENTOUS (AV) GORETEX  GRAFT: SHX6120

## 2020-07-28 LAB — BASIC METABOLIC PANEL
Anion gap: 25 — ABNORMAL HIGH (ref 5–15)
Anion gap: 18 — ABNORMAL HIGH (ref 5–15)
BUN: 137 mg/dL — ABNORMAL HIGH (ref 6–20)
BUN: 62 mg/dL — ABNORMAL HIGH (ref 6–20)
CO2: 19 mmol/L — ABNORMAL LOW (ref 22–32)
CO2: 21 mmol/L — ABNORMAL LOW (ref 22–32)
Calcium: 9.2 mg/dL (ref 8.9–10.3)
Calcium: 8.8 mg/dL — ABNORMAL LOW (ref 8.9–10.3)
Chloride: 91 mmol/L — ABNORMAL LOW (ref 98–111)
Chloride: 97 mmol/L — ABNORMAL LOW (ref 98–111)
Creatinine, Ser: 18.31 mg/dL — ABNORMAL HIGH (ref 0.44–1.00)
Creatinine, Ser: 10.48 mg/dL — ABNORMAL HIGH (ref 0.44–1.00)
GFR calc Af Amer: 2 mL/min — ABNORMAL LOW (ref >60)
GFR calc Af Amer: 5 mL/min — ABNORMAL LOW (ref >60)
GFR calc non Af Amer: 2 mL/min — ABNORMAL LOW (ref >60)
GFR calc non Af Amer: 4 mL/min — ABNORMAL LOW (ref >60)
Glucose, Bld: 91 mg/dL (ref 70–99)
Glucose, Bld: 158 mg/dL — ABNORMAL HIGH (ref 70–99)
Potassium: >7.5 mmol/L (ref 3.5–5.1)
Potassium: 4.5 mmol/L (ref 3.5–5.1)
Sodium: 135 mmol/L (ref 135–145)
Sodium: 136 mmol/L (ref 135–145)

## 2020-07-28 LAB — POCT I-STAT, CHEM 8
BUN: 55 mg/dL — ABNORMAL HIGH (ref 6–20)
BUN: 54 mg/dL — ABNORMAL HIGH (ref 6–20)
Calcium, Ion: 1.14 mmol/L — ABNORMAL LOW (ref 1.15–1.40)
Calcium, Ion: 1.21 mmol/L (ref 1.15–1.40)
Chloride: 94 mmol/L — ABNORMAL LOW (ref 98–111)
Chloride: 97 mmol/L — ABNORMAL LOW (ref 98–111)
Creatinine, Ser: 9.6 mg/dL — ABNORMAL HIGH (ref 0.44–1.00)
Creatinine, Ser: 10 mg/dL — ABNORMAL HIGH (ref 0.44–1.00)
Glucose, Bld: 333 mg/dL — ABNORMAL HIGH (ref 70–99)
Glucose, Bld: 129 mg/dL — ABNORMAL HIGH (ref 70–99)
HCT: 33 % — ABNORMAL LOW (ref 36.0–46.0)
HCT: 29 % — ABNORMAL LOW (ref 36.0–46.0)
Hemoglobin: 11.2 g/dL — ABNORMAL LOW (ref 12.0–15.0)
Hemoglobin: 9.9 g/dL — ABNORMAL LOW (ref 12.0–15.0)
Potassium: 3.4 mmol/L — ABNORMAL LOW (ref 3.5–5.1)
Potassium: 2.9 mmol/L — ABNORMAL LOW (ref 3.5–5.1)
Sodium: 137 mmol/L (ref 135–145)
Sodium: 138 mmol/L (ref 135–145)
TCO2: 27 mmol/L (ref 22–32)
TCO2: 28 mmol/L (ref 22–32)

## 2020-07-28 LAB — CBC WITH DIFFERENTIAL/PLATELET
Abs Immature Granulocytes: 0.02 10*3/uL (ref 0.00–0.07)
Basophils Absolute: 0 10*3/uL (ref 0.0–0.1)
Basophils Relative: 1 %
Eosinophils Absolute: 0.1 10*3/uL (ref 0.0–0.5)
Eosinophils Relative: 1 %
HCT: 34.9 % — ABNORMAL LOW (ref 36.0–46.0)
Hemoglobin: 10.3 g/dL — ABNORMAL LOW (ref 12.0–15.0)
Immature Granulocytes: 0 %
Lymphocytes Relative: 22 %
Lymphs Abs: 1.2 10*3/uL (ref 0.7–4.0)
MCH: 26.3 pg (ref 26.0–34.0)
MCHC: 29.5 g/dL — ABNORMAL LOW (ref 30.0–36.0)
MCV: 89 fL (ref 80.0–100.0)
Monocytes Absolute: 0.8 10*3/uL (ref 0.1–1.0)
Monocytes Relative: 15 %
Neutro Abs: 3.3 10*3/uL (ref 1.7–7.7)
Neutrophils Relative %: 61 %
Platelets: 269 10*3/uL (ref 150–400)
RBC: 3.92 MIL/uL (ref 3.87–5.11)
RDW: 19.5 % — ABNORMAL HIGH (ref 11.5–15.5)
WBC: 5.4 10*3/uL (ref 4.0–10.5)
nRBC: 0.6 % — ABNORMAL HIGH (ref 0.0–0.2)

## 2020-07-28 LAB — I-STAT CHEM 8, ED
BUN: >130 mg/dL — ABNORMAL HIGH (ref 6–20)
Calcium, Ion: 1.01 mmol/L — ABNORMAL LOW (ref 1.15–1.40)
Chloride: 96 mmol/L — ABNORMAL LOW (ref 98–111)
Creatinine, Ser: >18 mg/dL — ABNORMAL HIGH (ref 0.44–1.00)
Glucose, Bld: 86 mg/dL (ref 70–99)
HCT: 36 % (ref 36.0–46.0)
Hemoglobin: 12.2 g/dL (ref 12.0–15.0)
Potassium: 7.4 mmol/L (ref 3.5–5.1)
Sodium: 132 mmol/L — ABNORMAL LOW (ref 135–145)
TCO2: 21 mmol/L — ABNORMAL LOW (ref 22–32)

## 2020-07-28 LAB — CBC
HCT: 30.7 % — ABNORMAL LOW (ref 36.0–46.0)
Hemoglobin: 9.1 g/dL — ABNORMAL LOW (ref 12.0–15.0)
MCH: 26.1 pg (ref 26.0–34.0)
MCHC: 29.6 g/dL — ABNORMAL LOW (ref 30.0–36.0)
MCV: 88.2 fL (ref 80.0–100.0)
Platelets: 222 10*3/uL (ref 150–400)
RBC: 3.48 MIL/uL — ABNORMAL LOW (ref 3.87–5.11)
RDW: 19.3 % — ABNORMAL HIGH (ref 11.5–15.5)
WBC: 3.9 10*3/uL — ABNORMAL LOW (ref 4.0–10.5)
nRBC: 0 % (ref 0.0–0.2)

## 2020-07-28 LAB — GLUCOSE, CAPILLARY
Glucose-Capillary: 79 mg/dL (ref 70–99)
Glucose-Capillary: 77 mg/dL (ref 70–99)

## 2020-07-28 LAB — CBG MONITORING, ED: Glucose-Capillary: 113 mg/dL — ABNORMAL HIGH (ref 70–99)

## 2020-07-28 LAB — I-STAT BETA HCG BLOOD, ED (MC, WL, AP ONLY): I-stat hCG, quantitative: <5 m[IU]/mL (ref <5)

## 2020-07-28 LAB — TYPE AND SCREEN
ABO/RH(D): A POS
Antibody Screen: NEGATIVE

## 2020-07-28 LAB — SARS CORONAVIRUS 2 BY RT PCR (HOSPITAL ORDER, PERFORMED IN ~~LOC~~ HOSPITAL LAB): SARS Coronavirus 2: NEGATIVE

## 2020-07-28 LAB — TROPONIN I (HIGH SENSITIVITY): Troponin I (High Sensitivity): 170 ng/L (ref <18)

## 2020-07-28 SURGERY — THROMBECTOMY AND REVISION OF ARTERIOVENTOUS (AV) GORETEX  GRAFT
Anesthesia: General | Site: Arm Upper | Laterality: Left

## 2020-07-28 MED ORDER — 0.9 % SODIUM CHLORIDE (POUR BTL) OPTIME
TOPICAL | Status: DC | PRN
  Administered 2020-07-28: 1000 mL

## 2020-07-28 MED ORDER — CINACALCET HCL 30 MG PO TABS
30.0000 mg | ORAL_TABLET | Freq: Every day | ORAL | Status: DC
  Administered 2020-07-29 – 2020-08-02 (×4): 30 mg via ORAL
  Filled 2020-07-28 (×4): qty 1

## 2020-07-28 MED ORDER — GLYCOPYRROLATE 0.2 MG/ML IJ SOLN
INTRAMUSCULAR | Status: DC | PRN
Start: 2020-07-28 — End: 2020-07-28
  Administered 2020-07-28: .4 mg via INTRAVENOUS

## 2020-07-28 MED ORDER — PROPOFOL 10 MG/ML IV BOLUS
INTRAVENOUS | Status: DC | PRN
  Administered 2020-07-28 (×2): 100 mg via INTRAVENOUS

## 2020-07-28 MED ORDER — MIDAZOLAM HCL 5 MG/5ML IJ SOLN
INTRAMUSCULAR | Status: DC | PRN
  Administered 2020-07-28: 1 mg via INTRAVENOUS

## 2020-07-28 MED ORDER — PHENYLEPHRINE HCL (PRESSORS) 10 MG/ML IV SOLN
INTRAVENOUS | Status: AC
  Filled 2020-07-28: qty 1

## 2020-07-28 MED ORDER — PROPOFOL 10 MG/ML IV BOLUS
INTRAVENOUS | Status: AC
  Filled 2020-07-28: qty 20

## 2020-07-28 MED ORDER — INSULIN ASPART 100 UNIT/ML IV SOLN
5.0000 [IU] | Freq: Once | INTRAVENOUS | Status: AC
  Administered 2020-07-28: 5 [IU] via INTRAVENOUS

## 2020-07-28 MED ORDER — INSULIN ASPART 100 UNIT/ML ~~LOC~~ SOLN
SUBCUTANEOUS | Status: DC | PRN
  Administered 2020-07-28: 5 [IU] via INTRAVENOUS

## 2020-07-28 MED ORDER — DEXAMETHASONE SODIUM PHOSPHATE 4 MG/ML IJ SOLN
INTRAMUSCULAR | Status: DC | PRN
  Administered 2020-07-28: 4 mg via INTRAVENOUS

## 2020-07-28 MED ORDER — MIDAZOLAM HCL 2 MG/2ML IJ SOLN
INTRAMUSCULAR | Status: AC
  Filled 2020-07-28: qty 2

## 2020-07-28 MED ORDER — FENTANYL CITRATE (PF) 100 MCG/2ML IJ SOLN
INTRAMUSCULAR | Status: DC | PRN
  Administered 2020-07-28: 50 ug via INTRAVENOUS
  Administered 2020-07-28: 100 ug via INTRAVENOUS
  Administered 2020-07-28 (×2): 50 ug via INTRAVENOUS

## 2020-07-28 MED ORDER — CALCIUM CHLORIDE 10 % IV SOLN
INTRAVENOUS | Status: AC
  Filled 2020-07-28: qty 10

## 2020-07-28 MED ORDER — HYDROXYZINE HCL 10 MG PO TABS
ORAL_TABLET | ORAL | Status: AC
  Administered 2020-07-28: 10 mg
  Filled 2020-07-28: qty 1

## 2020-07-28 MED ORDER — SODIUM CHLORIDE 0.9 % IV SOLN
INTRAVENOUS | Status: AC
  Filled 2020-07-28: qty 1.2

## 2020-07-28 MED ORDER — SODIUM ZIRCONIUM CYCLOSILICATE 10 G PO PACK
10.0000 g | PACK | ORAL | Status: AC
  Administered 2020-07-28: 10 g via ORAL
  Filled 2020-07-28: qty 1

## 2020-07-28 MED ORDER — ETOMIDATE 2 MG/ML IV SOLN
INTRAVENOUS | Status: AC
  Filled 2020-07-28: qty 10

## 2020-07-28 MED ORDER — SODIUM CHLORIDE 0.9 % IV SOLN
INTRAVENOUS | Status: DC | PRN
  Administered 2020-07-28: 500 mL

## 2020-07-28 MED ORDER — HEPARIN SODIUM (PORCINE) 5000 UNIT/ML IJ SOLN
5000.0000 [IU] | Freq: Three times a day (TID) | INTRAMUSCULAR | Status: DC
Start: 2020-07-29 — End: 2020-07-28

## 2020-07-28 MED ORDER — DEXTROSE 50 % IV SOLN
INTRAVENOUS | Status: AC
  Filled 2020-07-28: qty 50

## 2020-07-28 MED ORDER — ONDANSETRON HCL 4 MG PO TABS: 4.0000 mg | ORAL_TABLET | Freq: Four times a day (QID) | ORAL | Status: DC | PRN

## 2020-07-28 MED ORDER — CARVEDILOL 12.5 MG PO TABS
12.5000 mg | ORAL_TABLET | Freq: Every day | ORAL | Status: DC
  Administered 2020-07-29 – 2020-08-02 (×3): 12.5 mg via ORAL
  Filled 2020-07-28 (×4): qty 1

## 2020-07-28 MED ORDER — FENTANYL CITRATE (PF) 250 MCG/5ML IJ SOLN
INTRAMUSCULAR | Status: AC
  Filled 2020-07-28: qty 5

## 2020-07-28 MED ORDER — LIDOCAINE HCL (CARDIAC) PF 100 MG/5ML IV SOSY
PREFILLED_SYRINGE | INTRAVENOUS | Status: DC | PRN
  Administered 2020-07-28: 60 mg via INTRAVENOUS

## 2020-07-28 MED ORDER — HEPARIN SODIUM (PORCINE) 1000 UNIT/ML IJ SOLN
INTRAMUSCULAR | Status: AC
  Filled 2020-07-28: qty 4

## 2020-07-28 MED ORDER — ACETAMINOPHEN 325 MG PO TABS
650.0000 mg | ORAL_TABLET | Freq: Four times a day (QID) | ORAL | Status: DC | PRN
  Administered 2020-07-28: 650 mg via ORAL
  Filled 2020-07-28: qty 2

## 2020-07-28 MED ORDER — CISATRACURIUM BESYLATE 20 MG/10ML IV SOLN
INTRAVENOUS | Status: AC
  Filled 2020-07-28: qty 10

## 2020-07-28 MED ORDER — CALCIUM CHLORIDE 10 % IV SOLN
INTRAVENOUS | Status: DC | PRN
  Administered 2020-07-28: .5 g via INTRAVENOUS

## 2020-07-28 MED ORDER — ACETAMINOPHEN 650 MG RE SUPP: 650.0000 mg | Freq: Four times a day (QID) | RECTAL | Status: DC | PRN

## 2020-07-28 MED ORDER — ACETAMINOPHEN 650 MG RE SUPP: 325.0000 mg | RECTAL | Status: DC | PRN

## 2020-07-28 MED ORDER — NEOSTIGMINE METHYLSULFATE 10 MG/10ML IV SOLN
INTRAVENOUS | Status: DC | PRN
Start: 2020-07-28 — End: 2020-07-28
  Administered 2020-07-28: 2.5 mg via INTRAVENOUS

## 2020-07-28 MED ORDER — SODIUM CHLORIDE 0.9 % IV SOLN: INTRAVENOUS | Status: DC | PRN

## 2020-07-28 MED ORDER — CALCIUM ACETATE (PHOS BINDER) 667 MG PO CAPS
1334.0000 mg | ORAL_CAPSULE | ORAL | Status: DC
  Administered 2020-07-28 – 2020-08-02 (×6): 1334 mg via ORAL
  Filled 2020-07-28 (×3): qty 2

## 2020-07-28 MED ORDER — CALCIUM ACETATE (PHOS BINDER) 667 MG PO CAPS
2668.0000 mg | ORAL_CAPSULE | Freq: Three times a day (TID) | ORAL | Status: DC
  Administered 2020-07-28 – 2020-08-02 (×9): 2668 mg via ORAL
  Filled 2020-07-28 (×17): qty 4

## 2020-07-28 MED ORDER — ALBUTEROL SULFATE HFA 108 (90 BASE) MCG/ACT IN AERS
8.0000 | INHALATION_SPRAY | Freq: Once | RESPIRATORY_TRACT | Status: AC
  Administered 2020-07-28: 8 via RESPIRATORY_TRACT
  Filled 2020-07-28: qty 6.7

## 2020-07-28 MED ORDER — CISATRACURIUM BESYLATE (PF) 10 MG/5ML IV SOLN
INTRAVENOUS | Status: DC | PRN
Start: 2020-07-28 — End: 2020-07-28
  Administered 2020-07-28: 2 mg via INTRAVENOUS
  Administered 2020-07-28: 8 mg via INTRAVENOUS
  Administered 2020-07-28 (×2): 2 mg via INTRAVENOUS

## 2020-07-28 MED ORDER — ACETAMINOPHEN 325 MG PO TABS
325.0000 mg | ORAL_TABLET | ORAL | Status: DC | PRN
Start: 2020-07-28 — End: 2020-07-28

## 2020-07-28 MED ORDER — DEXTROSE 50 % IV SOLN
1.0000 | Freq: Once | INTRAVENOUS | Status: AC
  Administered 2020-07-28: 50 mL via INTRAVENOUS
  Filled 2020-07-28: qty 50

## 2020-07-28 MED ORDER — LOPERAMIDE HCL 2 MG PO CAPS
4.0000 mg | ORAL_CAPSULE | ORAL | Status: DC
  Administered 2020-07-30: 4 mg via ORAL
  Filled 2020-07-28: qty 2

## 2020-07-28 MED ORDER — OXYCODONE-ACETAMINOPHEN 5-325 MG PO TABS
1.0000 | ORAL_TABLET | ORAL | Status: DC | PRN
  Administered 2020-07-28: 2 via ORAL
  Administered 2020-07-29: 1 via ORAL
  Administered 2020-07-29 (×2): 2 via ORAL
  Administered 2020-07-30: 1 via ORAL
  Administered 2020-07-30 – 2020-07-31 (×5): 2 via ORAL
  Administered 2020-08-01: 1 via ORAL
  Administered 2020-08-01 – 2020-08-02 (×4): 2 via ORAL
  Filled 2020-07-28 (×11): qty 2
  Filled 2020-07-28: qty 1
  Filled 2020-07-28 (×2): qty 2

## 2020-07-28 MED ORDER — POLYETHYLENE GLYCOL 3350 17 G PO PACK: 17.0000 g | PACK | Freq: Every day | ORAL | Status: DC | PRN

## 2020-07-28 MED ORDER — DEXTROSE 50 % IV SOLN
INTRAVENOUS | Status: DC | PRN
  Administered 2020-07-28: 12.5 g via INTRAVENOUS

## 2020-07-28 MED ORDER — ONDANSETRON 4 MG PO TBDP
4.0000 mg | ORAL_TABLET | Freq: Once | ORAL | Status: AC
  Administered 2020-07-28: 4 mg via ORAL
  Filled 2020-07-28: qty 1

## 2020-07-28 MED ORDER — ONDANSETRON HCL 4 MG/2ML IJ SOLN: 4.0000 mg | Freq: Four times a day (QID) | INTRAMUSCULAR | Status: DC | PRN

## 2020-07-28 MED ORDER — ONDANSETRON HCL 4 MG/2ML IJ SOLN
INTRAMUSCULAR | Status: DC | PRN
  Administered 2020-07-28: 4 mg via INTRAVENOUS

## 2020-07-28 MED ORDER — DARBEPOETIN ALFA 60 MCG/0.3ML IJ SOSY
60.0000 ug | PREFILLED_SYRINGE | INTRAMUSCULAR | Status: DC
  Filled 2020-07-28: qty 0.3

## 2020-07-28 MED ORDER — SODIUM CHLORIDE 0.9% IV SOLUTION: Freq: Once | INTRAVENOUS | Status: DC

## 2020-07-28 MED ORDER — HYDROXYZINE HCL 10 MG PO TABS
10.0000 mg | ORAL_TABLET | Freq: Three times a day (TID) | ORAL | Status: DC | PRN
  Administered 2020-07-28 – 2020-08-02 (×4): 10 mg via ORAL
  Filled 2020-07-28 (×8): qty 1

## 2020-07-28 MED ORDER — ETOMIDATE 2 MG/ML IV SOLN
INTRAVENOUS | Status: DC | PRN
Start: 2020-07-28 — End: 2020-07-28
  Administered 2020-07-28: 10 mg via INTRAVENOUS

## 2020-07-28 MED ORDER — CALCITRIOL 0.25 MCG PO CAPS
2.0000 ug | ORAL_CAPSULE | ORAL | Status: DC
  Administered 2020-07-30 – 2020-08-01 (×2): 2 ug via ORAL
  Filled 2020-07-28: qty 4

## 2020-07-28 MED ORDER — SODIUM BICARBONATE 8.4 % IV SOLN
50.0000 meq | Freq: Once | INTRAVENOUS | Status: AC
  Administered 2020-07-28: 50 meq via INTRAVENOUS
  Filled 2020-07-28: qty 50

## 2020-07-28 MED ORDER — CEFAZOLIN SODIUM-DEXTROSE 2-3 GM-%(50ML) IV SOLR
INTRAVENOUS | Status: DC | PRN
Start: 2020-07-28 — End: 2020-07-28
  Administered 2020-07-28: 1 g via INTRAVENOUS

## 2020-07-28 MED ORDER — MORPHINE SULFATE (PF) 2 MG/ML IV SOLN
2.0000 mg | INTRAVENOUS | Status: DC | PRN
  Administered 2020-08-01 – 2020-08-02 (×3): 2 mg via INTRAVENOUS
  Filled 2020-07-28 (×3): qty 1

## 2020-07-28 MED ORDER — ALBUTEROL SULFATE HFA 108 (90 BASE) MCG/ACT IN AERS
INHALATION_SPRAY | RESPIRATORY_TRACT | Status: AC
  Filled 2020-07-28: qty 6.7

## 2020-07-28 MED ORDER — CALCIUM GLUCONATE-NACL 1-0.675 GM/50ML-% IV SOLN
1.0000 g | Freq: Once | INTRAVENOUS | Status: AC
  Administered 2020-07-28: 1000 mg via INTRAVENOUS
  Filled 2020-07-28: qty 50

## 2020-07-28 MED ORDER — HEPARIN SODIUM (PORCINE) 1000 UNIT/ML IJ SOLN
INTRAMUSCULAR | Status: DC | PRN
  Administered 2020-07-28: 4000 [IU] via INTRAVENOUS

## 2020-07-28 MED ORDER — ALBUTEROL SULFATE HFA 108 (90 BASE) MCG/ACT IN AERS
INHALATION_SPRAY | RESPIRATORY_TRACT | Status: DC | PRN
Start: 2020-07-28 — End: 2020-07-28
  Administered 2020-07-28: 5 via RESPIRATORY_TRACT

## 2020-07-28 MED ORDER — HYDROXYZINE HCL 10 MG PO TABS
10.0000 mg | ORAL_TABLET | Freq: Once | ORAL | Status: DC
  Filled 2020-07-28: qty 1

## 2020-07-28 MED ORDER — HEPARIN SODIUM (PORCINE) 5000 UNIT/ML IJ SOLN
5000.0000 [IU] | Freq: Three times a day (TID) | INTRAMUSCULAR | Status: DC
  Administered 2020-07-28 – 2020-08-02 (×11): 5000 [IU] via SUBCUTANEOUS
  Filled 2020-07-28 (×11): qty 1

## 2020-07-28 MED ORDER — CHLORHEXIDINE GLUCONATE CLOTH 2 % EX PADS
6.0000 | MEDICATED_PAD | Freq: Every day | CUTANEOUS | Status: DC
  Administered 2020-07-30 – 2020-08-02 (×3): 6 via TOPICAL

## 2020-07-28 MED ORDER — RENA-VITE PO TABS
1.0000 | ORAL_TABLET | Freq: Every day | ORAL | Status: DC
  Administered 2020-07-28 – 2020-08-01 (×5): 1 via ORAL
  Filled 2020-07-28 (×5): qty 1

## 2020-07-28 MED ORDER — CARVEDILOL 12.5 MG PO TABS
12.5000 mg | ORAL_TABLET | ORAL | Status: DC
  Administered 2020-07-28 – 2020-07-31 (×2): 12.5 mg via ORAL
  Filled 2020-07-28: qty 1

## 2020-07-28 MED ORDER — PHENYLEPHRINE 40 MCG/ML (10ML) SYRINGE FOR IV PUSH (FOR BLOOD PRESSURE SUPPORT)
PREFILLED_SYRINGE | INTRAVENOUS | Status: AC
  Filled 2020-07-28: qty 10

## 2020-07-28 MED ORDER — PROTAMINE SULFATE 10 MG/ML IV SOLN
INTRAVENOUS | Status: DC | PRN
Start: 2020-07-28 — End: 2020-07-28
  Administered 2020-07-28 (×2): 10 mg via INTRAVENOUS
  Administered 2020-07-28: 5 mg via INTRAVENOUS

## 2020-07-28 MED ORDER — PAPAVERINE HCL 30 MG/ML IJ SOLN
INTRAMUSCULAR | Status: AC
  Filled 2020-07-28: qty 2

## 2020-07-28 SURGICAL SUPPLY — 47 items
ADH SKN CLS APL DERMABOND .7 (GAUZE/BANDAGES/DRESSINGS) ×1
ARMBAND PINK RESTRICT EXTREMIT (MISCELLANEOUS) ×6 IMPLANT
BNDG ELASTIC 4X5.8 VLCR STR LF (GAUZE/BANDAGES/DRESSINGS) ×2 IMPLANT
BNDG GAUZE ELAST 4 BULKY (GAUZE/BANDAGES/DRESSINGS) ×2 IMPLANT
CANISTER SUCT 3000ML PPV (MISCELLANEOUS) ×3 IMPLANT
CANNULA VESSEL 3MM 2 BLNT TIP (CANNULA) IMPLANT
CATH EMB 3FR 80CM (CATHETERS) IMPLANT
CATH EMB 4FR 40CM (CATHETERS) ×2 IMPLANT
CATH EMB 4FR 80CM (CATHETERS) ×3 IMPLANT
CATH EMB 5FR 80CM (CATHETERS) IMPLANT
CLIP VESOCCLUDE MED 6/CT (CLIP) ×3 IMPLANT
CLIP VESOCCLUDE SM WIDE 6/CT (CLIP) ×3 IMPLANT
COVER WAND RF STERILE (DRAPES) ×3 IMPLANT
DERMABOND ADVANCED (GAUZE/BANDAGES/DRESSINGS) ×2
DERMABOND ADVANCED .7 DNX12 (GAUZE/BANDAGES/DRESSINGS) ×1 IMPLANT
DRAPE X-RAY CASS 24X20 (DRAPES) IMPLANT
ELECT REM PT RETURN 9FT ADLT (ELECTROSURGICAL) ×3
ELECTRODE REM PT RTRN 9FT ADLT (ELECTROSURGICAL) ×1 IMPLANT
GAUZE 4X4 16PLY RFD (DISPOSABLE) IMPLANT
GAUZE SPONGE 4X4 12PLY STRL (GAUZE/BANDAGES/DRESSINGS) ×2 IMPLANT
GAUZE SPONGE 4X4 16PLY XRAY LF (GAUZE/BANDAGES/DRESSINGS) ×2 IMPLANT
GLOVE BIO SURGEON STRL SZ7.5 (GLOVE) ×3 IMPLANT
GLOVE BIOGEL PI IND STRL 8 (GLOVE) ×1 IMPLANT
GLOVE BIOGEL PI INDICATOR 8 (GLOVE) ×2
GOWN STRL REUS W/ TWL LRG LVL3 (GOWN DISPOSABLE) ×3 IMPLANT
GOWN STRL REUS W/TWL LRG LVL3 (GOWN DISPOSABLE) ×9
KIT BASIN OR (CUSTOM PROCEDURE TRAY) ×3 IMPLANT
KIT TURNOVER KIT B (KITS) ×3 IMPLANT
NS IRRIG 1000ML POUR BTL (IV SOLUTION) ×3 IMPLANT
PACK CV ACCESS (CUSTOM PROCEDURE TRAY) ×3 IMPLANT
PAD ARMBOARD 7.5X6 YLW CONV (MISCELLANEOUS) ×6 IMPLANT
SET COLLECT BLD 21X3/4 12 (NEEDLE) IMPLANT
SPONGE SURGIFOAM ABS GEL 100 (HEMOSTASIS) IMPLANT
STOPCOCK 4 WAY LG BORE MALE ST (IV SETS) ×2 IMPLANT
SUCTION FRAZIER HANDLE 10FR (MISCELLANEOUS) ×3
SUCTION TUBE FRAZIER 10FR DISP (MISCELLANEOUS) ×1 IMPLANT
SUT ETHILON 3 0 PS 1 (SUTURE) ×6 IMPLANT
SUT PROLENE 5 0 C 1 24 (SUTURE) ×6 IMPLANT
SUT PROLENE 6 0 BV (SUTURE) ×5 IMPLANT
SUT VIC AB 3-0 SH 27 (SUTURE) ×3
SUT VIC AB 3-0 SH 27X BRD (SUTURE) ×1 IMPLANT
SUT VICRYL 4-0 PS2 18IN ABS (SUTURE) ×3 IMPLANT
SYR BULB EAR ULCER 3OZ GRN STR (SYRINGE) ×2 IMPLANT
TOWEL GREEN STERILE (TOWEL DISPOSABLE) ×3 IMPLANT
TUBING EXTENTION W/L.L. (IV SETS) IMPLANT
UNDERPAD 30X36 HEAVY ABSORB (UNDERPADS AND DIAPERS) ×3 IMPLANT
WATER STERILE IRR 1000ML POUR (IV SOLUTION) ×3 IMPLANT

## 2020-07-28 NOTE — Treatment Plan (Signed)
Informed of patient's arrival. P/W sob along with n/v/d and pain at HD catheter site. Missed last two dialysis treatments. K 7.4 with EKG changes. Received shifting agents, calcium gluconate, and lokelma. CXR reviewed: increased interstitial markings but no overt vascular congestion/pulm edema. COVID-19 test currently pending.  Patient dialyzes at [facility] TTS. Outpatient orders: F180, 3 hrs 14min. 2K, 2Ca, 137Na, 35Bicarb. BFR 400 via LIJ TDC. EDW 47kg. Calcitriol 4mcg qtreatment. Mircera 131mcg q2weeks.  Plan is to dialyze first thing this AM as soon as a nurse is available. Orders placed. Coordinated with dialysis staff. Due to patient's concerns for pain at catheter site, cultures have been obtained (holding on venofer for now). No drainage at exit site per ER MD. Full consult note to follow.  [name] Quint, MD [facility]

## 2020-07-28 NOTE — Progress Notes (Signed)
Admitted early this AM with severe hyperkalemia after missing sessions of hemodialysis.  Urgently hemodialysis for K+ 7.4.  Bleeding from fistula needing intervention.  Vascular surgery taken for oversewing of left arm brachial cephalic fistula.  Will continue to closely monitor and repeat her labs.  Please refer to H&P dictated by my partner Dr. Cyd Silence on 07/28/20 for further details of the assessment and plan.

## 2020-07-28 NOTE — Anesthesia Preprocedure Evaluation (Addendum)
Anesthesia Evaluation  Patient identified by MRN, date of birth, ID band Patient awake    Reviewed: Allergy & Precautions, NPO status , Patient's Chart, lab work & pertinent test resultsPreop documentation limited or incomplete due to emergent nature of procedure.  Airway Mallampati: III  TM Distance: >3 FB Neck ROM: Full    Dental  (+) Loose,    Pulmonary Current Smoker and Patient abstained from smoking.,    Pulmonary exam normal breath sounds clear to auscultation       Cardiovascular hypertension, Pt. on home beta blockers and Pt. on medications +CHF  Normal cardiovascular exam Rhythm:Regular Rate:Normal  ECG: rate 89   Neuro/Psych negative neurological ROS  negative psych ROS   GI/Hepatic negative GI ROS, (+)     substance abuse  ,   Endo/Other  Lupus (systemic lupus erythematosus)  Hyperkalemia  Renal/GU ESRF and DialysisRenal disease     Musculoskeletal negative musculoskeletal ROS (+)   Abdominal   Peds  Hematology  (+) anemia ,   Anesthesia Other Findings Bleeding left upper arm fistula  Reproductive/Obstetrics                            Anesthesia Physical Anesthesia Plan  ASA: IV and emergent  Anesthesia Plan: General   Post-op Pain Management:    Induction: Intravenous and Rapid sequence  PONV Risk Score and Plan: 2 and Ondansetron, Dexamethasone and Treatment may vary due to age or medical condition  Airway Management Planned: Oral ETT  Additional Equipment:   Intra-op Plan:   Post-operative Plan: Extubation in OR  Informed Consent:     History available from chart only and Only emergency history available  Plan Discussed with: CRNA and Surgeon  Anesthesia Plan Comments:        Anesthesia Quick Evaluation

## 2020-07-28 NOTE — Anesthesia Procedure Notes (Signed)
Procedure Name: Intubation Date/Time: 07/28/2020 10:32 AM Performed by: Oletta Lamas, CRNA Pre-anesthesia Checklist: Patient identified, Emergency Drugs available, Suction available and Patient being monitored Patient Re-evaluated:Patient Re-evaluated prior to induction Oxygen Delivery Method: Circle System Utilized Preoxygenation: Pre-oxygenation with 100% oxygen Induction Type: IV induction Ventilation: Mask ventilation without difficulty Laryngoscope Size: Miller and 2 Grade View: Grade II Tube type: Oral Number of attempts: 2 Airway Equipment and Method: Stylet Placement Confirmation: ETT inserted through vocal cords under direct vision,  positive ETCO2 and breath sounds checked- equal and bilateral Secured at: 23 cm Tube secured with: Tape Dental Injury: Teeth and Oropharynx as per pre-operative assessment  Comments: 1st attempt aborted due to light anesthetic. Tolerated induction/intubation well without complication

## 2020-07-28 NOTE — Progress Notes (Signed)
Pt resting in bed eyes closed.

## 2020-07-28 NOTE — Progress Notes (Signed)
Patient was seen on dialysis and the procedure was supervised.  BFR 400  Via TDC BP is  127/80.   Patient appears to be tolerating treatment well--- she tells me that she has not been able to get back to [facility]-  Last operative procedure was 5/23 -  Last bleeding was 7/29 when she was in shower.  She is being admitted-  I unfortunately wanted to take a look at the arm-  She has a small hole in her AVF that started to bleed..... had to call Dr. Scot Dock for operative intervention-  May need ligation -  Finished 2 hours of HD on a 1 k bth for the ak of 7.4-  It should be WNL at this time   Louis Meckel 07/28/2020

## 2020-07-28 NOTE — ED Triage Notes (Signed)
Patient [place] with EMS from a motel reports SOB this morning , no chest pain , she missed 2 hemodialysis treatment , vomited at triage , no fever or chills .

## 2020-07-28 NOTE — Transfer of Care (Signed)
Immediate Anesthesia Transfer of Care Note  Patient: [name]  Procedure(s) Performed: Oversewing of left arm Brachial cephalic fistula for bleeding. (Left Arm Upper)  Patient Location: PACU  Anesthesia Type:General  Level of Consciousness: drowsy and responds to stimulation  Airway & Oxygen Therapy: Patient Spontanous Breathing and Patient connected to nasal cannula oxygen  Post-op Assessment: Report given to RN and Post -op Vital signs reviewed and stable  Post vital signs: Reviewed and stable  Last Vitals:  Vitals Value Taken Time  BP 150/98 07/28/20 1150  Temp    Pulse 92 07/28/20 1151  Resp 32 07/28/20 1151  SpO2 100 % 07/28/20 1151  Vitals shown include unvalidated device data.  Last Pain:  Vitals:   07/28/20 0954  TempSrc: Oral  PainSc: 0-No pain         Complications: No complications documented.

## 2020-07-28 NOTE — Consult Note (Signed)
ESRD Consult Note [facility]  Requesting provider: Cyd Silence Sherryll Burger, MD Reason for consult: ESRD, provision of dialysis Indication for acute dialysis?: End Stage Renal Disease, hyperkalemia  Outpatient dialysis unit: [facility] Outpatient dialysis schedule: TTC  Assessment/Recommendations: [name] is a/an 39 y.o. female with a past medical history notable for ESRD on HD admitted with nausea/vomiting, shortness of breath, and weakness.   1. Hyperkalemia with EKG changes -s/p shifting agents, cal gluc, and lokelma -recommend repeating metabolic panel post dialysis to ensure improvement given the fact that she just got shifted and is now going on dialysis (possible she may have a rebound transcellular shift)  2. N/V, weakness, SOB: likely related to missed HD treatments, will monitor symptoms for now. Will utilize a lower BFR/DFR today given BUN >130  3. ESRD on hemodialysis: outpatient orders: 3 hrs 45 minutes 2K, 2Ca, 137Na, 35Bicarb, BFR 400  4. Volume/ hypertension: EDW 47kg. Attempt to achieve EDW as tolerated -Continue home antihypertensive regimen  5. Anemia of Chronic Kidney Disease: Hemoglobin 10.3. Currently receiving mircera as an outpatient, will use equivalent aranesp.   6. Secondary Hyperparathyroidism/Hyperphosphatemia: Calcitriol 34mcg qtreatment, ical    7. Vascular access: LIJ TDC: no induration/warmth, no leukocytosis/fevers.  Cultures (blood) already drawn -in regards to her LUE avf which recently infiltrated, she can follow up with [facility] as an outpatient (appt pending), do not remove dressing given ulceration  8.  History of polysubstance abuse with intermittent crack cocaine use and marijuana use  9. Chronic systolic and diastolic CHF: will dialyze down to her EDW. Clinically euvolemic.  Additional recommendations: - Dose all meds for creatinine clearance < 10 ml/min  - Unless absolutely necessary, no MRIs with gadolinium.  - Implement save  arm precautions.  Prefer needle sticks in the dorsum of the hands or wrists.  No blood pressure measurements in arm. - If blood transfusion is requested during hemodialysis sessions, please alert [name] prior to the session.  - If a hemodialysis catheter line culture is requested, please alert [name] as only hemodialysis nurses are able to collect those specimens.   Gean Quint, MD [facility]  History of Present Illness: [name] is a/an 39 y.o. female with a past medical history of ESRD on hemodialysis, lupus nephritis/SLE, hypertension, chronic systolic and diastolic heart failure, polysubstance abuse who presents with shortness of breath, nausea, vomiting, cough, and weakness.  To be was does dialysis Tuesday Thursday Saturday and missed her last 2 treatments.  Also having orthopnea along with a dry severe cough.  She does report pain over her catheter site.  She also has an ulceration over her left AV fistula with a recent admission due to bleeding which resolved with pressure. During my encounter, she reports feeling much better. Currently denies any fevers, chest pain, n/v, dizziness, paraesthesias.   Medications:  Current Facility-Administered Medications  Medication Dose Route Frequency Provider Last Rate Last Admin   calcitRIOL (ROCALTROL) capsule 2 mcg  2 mcg Oral Q T,Th,Sa-HD Gean Quint, MD       Chlorhexidine Gluconate Cloth 2 % PADS 6 each  6 each Topical Q0600 Gean Quint, MD       [START ON 07/30/2020] Darbepoetin Alfa (ARANESP) injection 60 mcg  60 mcg Intravenous Q Tue-HD Gean Quint, MD       Current Outpatient Medications  Medication Sig Dispense Refill   acetaminophen (TYLENOL) 500 MG tablet Take by mouth.     B Complex-C-Folic Acid (RENAL VITAMIN) 0.8 MG TABS Take 1  tablet by mouth daily.      calcium acetate (PHOSLO) 667 MG capsule Take 1,334-2,668 mg by mouth See admin instructions. Take 4 capsule (2668 mg) by mouth three times a day with each meal  and 2 capsules (1334 mg) with each snack     carvedilol (COREG) 12.5 MG tablet Take 12.5 mg by mouth 2 (two) times daily. Take one tablet (12.5 mg) by mouth every morning, take one tablet (12.5 mg) at night on Sunday, Monday, Wednesday, Friday     Cinacalcet HCl (SENSIPAR PO) Take 1 Dose by mouth daily.     diltiazem (CARDIZEM) 30 MG tablet Take 1 tablet (30 mg total) by mouth every 6 (six) hours as needed (SVT). (Patient taking differently: Take 30 mg by mouth 2 (two) times daily as needed (SVT). ) 60 tablet 1   diphenhydrAMINE (BENADRYL) 25 MG tablet Take 25-50 mg by mouth See admin instructions. Take one tablet (25 mg) by mouth daily at bedtime, take two tablets (50 mg) immediately prior to dialysis - Tuesday, Thursday, Saturday     diphenhydramine-acetaminophen (TYLENOL PM) 25-500 MG TABS tablet Take 0.5-1 tablets by mouth at bedtime as needed (sleep).     heparin 1000 unit/mL SOLN injection Heparin Sodium (Porcine) 1,000 Units/mL Catheter Lock Venous     hydrOXYzine (ATARAX/VISTARIL) 10 MG tablet Take 1 tablet (10 mg total) by mouth 3 (three) times daily as needed for anxiety. (Patient taking differently: Take 10 mg by mouth 3 (three) times daily as needed for itching or anxiety. ) 5 tablet 0   iron sucrose in sodium chloride 0.9 % 100 mL Iron Sucrose (Venofer)     loperamide (IMODIUM A-D) 2 MG tablet Take 4 mg by mouth See admin instructions. Take two tablest (4 mg) by mouth on dialysis days (Tuesday, Thursday, Saturday)     Methoxy PEG-Epoetin Beta (MIRCERA IJ) Inject 1 Dose as directed every 14 (fourteen) days.     multivitamin (RENA-VIT) TABS tablet Take 1 tablet by mouth daily. (Patient taking differently: Take 1 tablet by mouth at bedtime. ) 30 tablet 0   Nutritional Supplements (NOVASOURCE RENAL) LIQD Take 237 mLs by mouth See admin instructions. Take 1 container (237 mls) by at bedtime on dialysis days (Tuesday, Thursday, Saturday)       ALLERGIES Tobramycin  sulfate  MEDICAL HISTORY Past Medical History:  Diagnosis Date   Anemia    low iron - receives iron at dialysis   Chronic systolic congestive heart failure ([place]) 03/16/2016   Dyspnea    ESRD (end stage renal disease) ([place])    [facility] _ [place]   H/O pericarditis 01/17/2013   H/O pleural effusion 01/17/2013   Heart murmur    Lupus (systemic lupus erythematosus) ([facility])    Previously followed with Dr. 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 pneumonae bacteremia on 01/21/2013. Sensitive to Peniccilin      SOCIAL HISTORY Social History   Socioeconomic History   Marital status: Single    Spouse name: Not on file  Number of children: Not on file   Years of education: Not on file   Highest education level: Not on file  Occupational History   Not on file  Tobacco Use   Smoking status: Current Every Day Smoker    Packs/day: 0.12    Years: 15.00    Pack years: 1.80    Types: Cigarettes   Smokeless tobacco: Never Used   Tobacco comment: 4 cigarettes per day  Vaping Use   Vaping Use: Never used  Substance and Sexual Activity   Alcohol use: Yes    Alcohol/week: 0.0 standard drinks    Comment: Special Occasional takes Vicar   Drug use: Yes    Frequency: 5.0 times per week    Types: Marijuana    Comment: Marijuana and cocaine used last - Thanksgiving 2020   Sexual activity: Not Currently    Birth control/protection: None  Other Topics Concern   Not on file  Social History Narrative   Not on file    Social Determinants of Health   Financial Resource Strain:    Difficulty of Paying Living Expenses:   Food Insecurity:    Worried About Charity fundraiser in the Last Year:    Arboriculturist in the Last Year:   Transportation Needs:    Film/video editor (Medical):    Lack of Transportation (Non-Medical):   Physical Activity:    Days of Exercise per Week:    Minutes of Exercise per Session:   Stress:    Feeling of Stress :   Social Connections:    Frequency of Communication with Friends and Family:    Frequency of Social Gatherings with Friends and Family:    Attends Religious Services:    Active Member of Clubs or Organizations:    Attends Music therapist:    Marital Status:   Intimate Partner Violence:    Fear of Current or Ex-Partner:    Emotionally Abused:    Physically Abused:    Sexually Abused:      FAMILY HISTORY History reviewed. No pertinent family history.   Review of Systems: 12 systems were reviewed and negative except per HPI  Physical Exam: Vitals:   07/28/20 0500 07/28/20 0515  BP: (!) 129/111 (!) 140/94  Pulse: 79 (!) 37  Resp: 18 19  Temp:    SpO2: 98% 100%   No intake/output data recorded. No intake or output data in the 24 hours ending 07/28/20 0619 General: well-appearing, no acute distress, laying flat in bed HEENT: anicteric sclera, MMM CV: normal rate, no murmurs, no edema Lungs: bilateral chest rise, normal wob Abd: soft, non-tender, non-distended Skin: no visible lesions or rashes, LIJ tdc c/d/i (not able to express any drainage, tract nontender with no fluctuance or warmth) Msk: LUE avf currently bandaged with ace wrap  Psych: alert, engaged, appropriate mood and affect Neuro: normal speech, no gross focal deficits, rare/intermittent twitching   Test Results Reviewed Lab Results  Component Value Date   NA 132 (L) 07/28/2020   K 7.4 (HH) 07/28/2020   CL 96 (L) 07/28/2020   CO2 19 (L)  07/28/2020   BUN >130 (H) 07/28/2020   CREATININE >18.00 (H) 07/28/2020   CALCIUM 9.2 07/28/2020   ALBUMIN 2.3 (L) 05/20/2020   PHOS 10.1 (H) 07/30/2018    I have reviewed relevant outside healthcare records

## 2020-07-28 NOTE — ED Provider Notes (Addendum)
[facility] EMERGENCY DEPARTMENT Provider Note   CSN: [id number] Arrival date & time: 07/28/20  0401     History Chief Complaint  Patient presents with  . Shortness of Breath    Missed 2 Hemodialysis Treatment    [name] is a 39 y.o. female.  The history is provided by the patient.  Shortness of Breath Severity:  Moderate Onset quality:  Gradual Duration:  3 days Timing:  Constant Progression:  Worsening Chronicity:  Recurrent Context: not activity and not URI   Context comment:  Missed 2 dialysis treatments  Relieved by:  Nothing Worsened by:  Nothing Ineffective treatments:  None tried Associated symptoms: cough and vomiting   Associated symptoms: no abdominal pain, no chest pain, no fever, no neck pain, no rash, no sore throat and no swollen glands   Associated symptoms comment:  Diarrhea  Risk factors: no tobacco use   Patient with ESRD missed 2 dialysis treatments T, S.  Also believes her perma catheter is infected as she is having "stinging" pain at the site.  There is crusting but no active drainage.  She states he fistula is not working at this time.  Also states she has not been eating and has n/v/diarrhea.      Past Medical History:  Diagnosis Date  . Anemia    low iron - receives iron at dialysis  . Chronic systolic congestive heart failure ([place]) 03/16/2016  . Dyspnea   . ESRD (end stage renal disease) ([place])    H[facility] TTHSAT _ [place]  . H/O pericarditis 01/17/2013  . H/O pleural effusion 01/17/2013  . Heart murmur   . Lupus (systemic lupus erythematosus) (H[facility])    Previously followed with Dr. Charlestine Night, has not followed up recently  . 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 Pneumonia    many times  . Polysubstance abuse ([place])    cocaine, MJ, tobacco  . S/P pericardiocentesis  01/17/2013   H/o pericardial effusion with tamponade 2006   . Seizures ([place])    during pregnancy 1 time  . Streptococcal bacteremia 01/23/2013   She had two S. pneumonae bacteremia on 01/21/2013. Sensitive to Peniccilin     Patient Active Problem List   Diagnosis Date Noted  . 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 Hyperkalemia 07/29/2018  . SVT (supraventricular tachycardia) ([place]) 07/16/2018  . ESRD (end stage renal disease) on dialysis ([place]) 02/17/2017  . Chronic systolic congestive heart failure ([place]) 03/16/2016  . Other hypervolemia   . S/P thoracentesis   . Cough with hemoptysis   . Myalgia   . Pulmonary edema   . Rash and nonspecific skin eruption 06/26/2015  . Secondary Raynaud's phenomenon 06/24/2015  . Cramping of hands 04/19/2014  . Unspecified contraceptive management 04/19/2014  . End stage renal disease ([place]) 03/27/2014  . Insomnia 03/15/2014  . Benign hypertension with ESRD (end-stage renal disease) ([place]) 03/15/2014  . Tobacco abuse 02/15/2014  . Healthcare maintenance 02/15/2014  . Hypoalbuminemia 02/01/2014  . Nephrotic syndrome 02/01/2014  . ESRD on dialysis ([place]) 01/31/2014  . Pleural effusion, left 01/31/2014  . Microcytic anemia 01/29/2014  . Hypocalcemia 01/29/2014  . Cocaine abuse ([place]) 01/18/2013  . Marijuana smoker ([place]) 01/18/2013  . H/O pericarditis 01/17/2013  . SLE (systemic lupus erythematosus) ([place]) 01/17/2013  . Lupus nephritis ([place]) 01/17/2013  . S/P pericardiocentesis 01/17/2013  . H/O pleural  effusion 01/17/2013  . Nephrosis 01/17/2013  . Preseptal cellulitis 01/17/2013    Past Surgical History:  Procedure Laterality Date  . AV FISTULA PLACEMENT    . BASCILIC VEIN TRANSPOSITION Left 02/05/2014   Procedure: [place];  Surgeon: [name], MD;  Location: [facility];  Service: Vascular;  Laterality: Left;  . FISTULA SUPERFICIALIZATION Left 05/30/2018   Procedure: FISTULA PLICATION BASILIC VEIN TRANSPOSITION;  Surgeon:  Angelia Mould, MD;  Location: [facility];  Service: Vascular;  Laterality: Left;  . FISTULA SUPERFICIALIZATION Left 63/87/5643   Procedure: PLICATION OF LEFT ARTERIOVENOUS FISTULA ULCER;  Surgeon: [name], MD;  Location: [facility];  Service: Vascular;  Laterality: Left;  . INSERTION OF DIALYSIS CATHETER N/A 05/19/2020   Procedure: TUNNELED INSERTION  OF DIALYSIS CATHETER;  Surgeon: Waynetta Sandy, MD;  Location: [facility];  Service: Vascular;  Laterality: N/A;  . VENOGRAM Right 01/31/2014   Procedure: DIALYSIS CATHETER;  Surgeon: Serafina Mitchell, MD;  [facility];  Service: Cardiovascular;  Laterality: Right;     OB History   No obstetric history on file.     History reviewed. No pertinent family history.  Social History   Tobacco Use  . Smoking status: Current Every Day Smoker    Packs/day: 0.12    Years: 15.00    Pack years: 1.80    Types: Cigarettes  . Smokeless tobacco: Never Used  . Tobacco comment: 4 cigarettes per day  Vaping Use  . Vaping Use: Never used  Substance Use Topics  . Alcohol use: Yes    Alcohol/week: 0.0 standard drinks    Comment: Special Occasional takes Vicar  . Drug use: Yes    Frequency: 5.0 times per week    Types: Marijuana    Comment: Marijuana and cocaine used last - Thanksgiving 2020    Home Medications Prior to Admission medications   Medication Sig Start Date End Date Taking? Authorizing Provider  acetaminophen (TYLENOL) 500 MG tablet Take by mouth. 07/20/18   [provider]  B Complex-C-Folic Acid (RENAL VITAMIN) 0.8 MG TABS Take 1 tablet by mouth daily.  10/03/19   [provider]  calcium acetate (PHOSLO) 667 MG capsule Take 1,334-2,668 mg by mouth See admin instructions. Take 4 capsule (2668 mg) by mouth three times a day with each meal and 2 capsules (1334 mg) with each snack    [provider]  carvedilol (COREG) 12.5 MG tablet Take 12.5 mg by mouth 2 (two) times daily. Take one tablet  (12.5 mg) by mouth every morning, take one tablet (12.5 mg) at night on Sunday, Monday, Wednesday, Friday 11/07/19   [provider]  Cinacalcet HCl (SENSIPAR PO) Take 1 Dose by mouth daily.    [provider]  diltiazem (CARDIZEM) 30 MG tablet Take 1 tablet (30 mg total) by mouth every 6 (six) hours as needed (SVT). Patient taking differently: Take 30 mg by mouth 2 (two) times daily as needed (SVT).  07/19/18   Alphonzo Grieve, MD  diphenhydrAMINE (BENADRYL) 25 MG tablet Take 25-50 mg by mouth See admin instructions. Take one tablet (25 mg) by mouth daily at bedtime, take two tablets (50 mg) immediately prior to dialysis - Tuesday, Thursday, Saturday    [provider]  diphenhydramine-acetaminophen (TYLENOL PM) 25-500 MG TABS tablet Take 0.5-1 tablets by mouth at bedtime as needed (sleep).    [provider]  heparin 1000 unit/mL SOLN injection Heparin Sodium (Porcine) 1,000 Units/mL Catheter Lock Venous 05/21/20  06/05/21  [provider]  hydrOXYzine (ATARAX/VISTARIL) 10 MG tablet Take 1 tablet (10 mg total) by mouth 3 (three) times daily as needed for anxiety. Patient taking differently: Take 10 mg by mouth 3 (three) times daily as needed for itching or anxiety.  07/29/19   Caccavale, Sophia, PA-C  iron sucrose in sodium chloride 0.9 % 100 mL Iron Sucrose (Venofer) 06/13/20 06/12/21  [provider]  loperamide (IMODIUM A-D) 2 MG tablet Take 4 mg by mouth See admin instructions. Take two tablest (4 mg) by mouth on dialysis days (Tuesday, Thursday, Saturday)    [provider]  Methoxy PEG-Epoetin Beta (MIRCERA IJ) Inject 1 Dose as directed every 14 (fourteen) days. 03/16/20 03/15/21  [provider]  multivitamin (RENA-VIT) TABS tablet Take 1 tablet by mouth daily. Patient taking differently: Take 1 tablet by mouth at bedtime.  02/18/17   Holley Raring, MD  Nutritional Supplements (NOVASOURCE RENAL) LIQD Take 237 mLs by mouth See admin  instructions. Take 1 container (237 mls) by at bedtime on dialysis days (Tuesday, Thursday, Saturday)    [provider]    Allergies    Tobramycin sulfate  Review of Systems   Review of Systems  Constitutional: Negative for fever.  HENT: Negative for sore throat.   Eyes: Negative for visual disturbance.  Respiratory: Positive for cough and shortness of breath.   Cardiovascular: Negative for chest pain.  Gastrointestinal: Positive for diarrhea, nausea and vomiting. Negative for abdominal pain.  Genitourinary: Negative for flank pain.  Musculoskeletal: Negative for neck pain.  Skin: Negative for rash.  Neurological: Negative for facial asymmetry.  Psychiatric/Behavioral: Negative for agitation.    Physical Exam Updated Vital Signs BP (!) 147/94 (BP Location: Right Arm)   Pulse 90   Temp 98.6 F (37 C) (Oral)   Resp 18   Ht 5\' 5"  (1.651 m)   Wt 45 kg   SpO2 100%   BMI 16.51 kg/m   Physical Exam Vitals and nursing note reviewed.  Constitutional:      Appearance: Normal appearance. She is not diaphoretic.  HENT:     Head: Normocephalic and atraumatic.     Nose: Nose normal.  Eyes:     Conjunctiva/sclera: Conjunctivae normal.     Pupils: Pupils are equal, round, and reactive to light.  Cardiovascular:     Rate and Rhythm: Normal rate and regular rhythm.     Pulses: Normal pulses.     Heart sounds: Normal heart sounds.  Pulmonary:     Breath sounds: Rales present.  Abdominal:     General: Abdomen is flat. Bowel sounds are normal.     Tenderness: There is no abdominal tenderness. There is no guarding or rebound.  Musculoskeletal:        General: Normal range of motion.     Cervical back: Normal range of motion and neck supple.  Skin:    General: Skin is warm and dry.     Capillary Refill: Capillary refill takes less than 2 seconds.     Comments: Crusted at insertion of catheter in LU chest but no active drainage, alcohol applied   Neurological:      General: No focal deficit present.     Mental Status: She is alert and oriented to person, place, and time.     Deep Tendon Reflexes: Reflexes normal.  Psychiatric:        Mood and Affect: Mood normal.        Behavior: Behavior normal.  ED Results / Procedures / Treatments   Labs (all labs ordered are listed, but only abnormal results are displayed) Results for orders placed or performed during the hospital encounter of 07/28/20  CBC with Differential  Result Value Ref Range   WBC 5.4 4.0 - 10.5 K/uL   RBC 3.92 3.87 - 5.11 MIL/uL   Hemoglobin 10.3 (L) 12.0 - 15.0 g/dL   HCT 34.9 (L) 36 - 46 %   MCV 89.0 80.0 - 100.0 fL   MCH 26.3 26.0 - 34.0 pg   MCHC 29.5 (L) 30.0 - 36.0 g/dL   RDW 19.5 (H) 11.5 - 15.5 %   Platelets 269 150 - 400 K/uL   nRBC 0.6 (H) 0.0 - 0.2 %   Neutrophils Relative % 61 %   Neutro Abs 3.3 1.7 - 7.7 K/uL   Lymphocytes Relative 22 %   Lymphs Abs 1.2 0.7 - 4.0 K/uL   Monocytes Relative 15 %   Monocytes Absolute 0.8 0 - 1 K/uL   Eosinophils Relative 1 %   Eosinophils Absolute 0.1 0 - 0 K/uL   Basophils Relative 1 %   Basophils Absolute 0.0 0 - 0 K/uL   Immature Granulocytes 0 %   Abs Immature Granulocytes 0.02 0.00 - 0.07 K/uL  I-Stat Beta hCG blood, ED ([facility], [facility], [facility] only)  Result Value Ref Range   I-stat hCG, quantitative <5.0 <5 mIU/mL   Comment 3          I-stat chem 8, ED (not at [facility] or [facility])  Result Value Ref Range   Sodium 132 (L) 135 - 145 mmol/L   Potassium 7.4 (HH) 3.5 - 5.1 mmol/L   Chloride 96 (L) 98 - 111 mmol/L   BUN >130 (H) 6 - 20 mg/dL   Creatinine, Ser >18.00 (H) 0.44 - 1.00 mg/dL   Glucose, Bld 86 70 - 99 mg/dL   Calcium, Ion 1.01 (L) 1.15 - 1.40 mmol/L   TCO2 21 (L) 22 - 32 mmol/L   Hemoglobin 12.2 12.0 - 15.0 g/dL   HCT 36.0 36 - 46 %   DG Chest 2 View  Result Date: 07/28/2020 CLINICAL DATA:  Chest pain while laying flat. EXAM: CHEST - 2 VIEW COMPARISON:  05/19/2020 FINDINGS: Moderate enlargement of the cardiopericardial  silhouette. No mediastinal or hilar masses or evidence of adenopathy. Lungs are hyperexpanded. Mild stable interstitial thickening. No evidence of pneumonia or pulmonary edema. No pleural effusion or pneumothorax. Left internal jugular tunneled dual lumen central venous catheter is stable, tip in the lower superior vena cava. Skeletal structures are intact. IMPRESSION: 1. No acute cardiopulmonary disease. 2. Stable cardiomegaly. Electronically Signed   By: Lajean Manes M.D.   On: 07/28/2020 04:34    EKG EKG Interpretation  Date/Time:  Sunday July 28 2020 04:11:18 EDT Ventricular Rate:  166 PR Interval:    QRS Duration: 122 QT Interval:  260 QTC Calculation: 432 R Axis:   -125 Text Interpretation: Sinus rhythm with first degree AV block Right bundle branch block Hyperkalemia Confirmed by Dory Horn) on 07/28/2020 4:28:46 AM   Radiology DG Chest 2 View  Result Date: 07/28/2020 CLINICAL DATA:  Chest pain while laying flat. EXAM: CHEST - 2 VIEW COMPARISON:  05/19/2020 FINDINGS: Moderate enlargement of the cardiopericardial silhouette. No mediastinal or hilar masses or evidence of adenopathy. Lungs are hyperexpanded. Mild stable interstitial thickening. No evidence of pneumonia or pulmonary edema. No pleural effusion or pneumothorax. Left internal jugular tunneled dual lumen central venous catheter is stable,  tip in the lower superior vena cava. Skeletal structures are intact. IMPRESSION: 1. No acute cardiopulmonary disease. 2. Stable cardiomegaly. Electronically Signed   By: Lajean Manes M.D.   On: 07/28/2020 04:34    Procedures Procedures (including critical care time)  Medications Ordered in ED Medications  sodium bicarbonate injection 50 mEq (has no administration in time range)  calcium gluconate 1 g/ 50 mL sodium chloride IVPB (has no administration in time range)  insulin aspart (novoLOG) injection 5 Units (has no administration in time range)    And  dextrose 50 %  solution 50 mL (has no administration in time range)  albuterol (VENTOLIN HFA) 108 (90 Base) MCG/ACT inhaler 8 puff (has no administration in time range)  ondansetron (ZOFRAN-ODT) disintegrating tablet 4 mg (4 mg Oral Given 07/28/20 0413)    ED Course  I have reviewed the triage vital signs and the nursing notes.  Pertinent labs & imaging results that were available during my care of the patient were reviewed by me and considered in my medical decision making (see chart for details).   450 case d/w Dr. Candiss Norse who will see the patient for dialysis   MDM Reviewed: previous chart, nursing note and vitals Reviewed previous: labs Interpretation: labs, ECG and x-ray (hyperkalemia, elevated BUN.  NACPD by me on cxr ) Total time providing critical care: 30-74 minutes (hyperkalemia protocol initiated immediately ). This excludes time spent performing separately reportable procedures and services. Consults: admitting MD (nephrology, Dr. Candiss Norse )  CRITICAL CARE Performed by: Ashni Lonzo K Halli Equihua-Rasch Total critical care time: 60 minutes Critical care time was exclusive of separately billable procedures and treating other patients. Critical care was necessary to treat or prevent imminent or life-threatening deterioration. Critical care was time spent personally by me on the following activities: development of treatment plan with patient and/or surrogate as well as nursing, discussions with consultants, evaluation of patient's response to treatment, examination of patient, obtaining history from patient or surrogate, ordering and performing treatments and interventions, ordering and review of laboratory studies, ordering and review of radiographic studies, pulse oximetry and re-evaluation of patient's condition.  Final Clinical Impression(s) / ED Diagnoses Final diagnoses:  Hyperkalemia   Admit to medicine, will need emergent dialysis.        Tatem Fesler, MD 07/28/20 [phone]

## 2020-07-28 NOTE — Anesthesia Postprocedure Evaluation (Signed)
Anesthesia Post Note  Patient: [name]  Procedure(s) Performed: Oversewing of left arm Brachial cephalic fistula for bleeding. (Left Arm Upper)     Patient location during evaluation: PACU Anesthesia Type: General Level of consciousness: awake and alert Pain management: pain level controlled Vital Signs Assessment: post-procedure vital signs reviewed and stable Respiratory status: spontaneous breathing, nonlabored ventilation, respiratory function stable and patient connected to nasal cannula oxygen Cardiovascular status: blood pressure returned to baseline and stable Postop Assessment: no apparent nausea or vomiting Anesthetic complications: no   No complications documented.  Last Vitals:  Vitals:   07/28/20 1345 07/28/20 1400  BP: (!) 145/94   Pulse: 80 80  Resp: 19 16  Temp:    SpO2: 100% 100%    Last Pain:  Vitals:   07/28/20 1330  TempSrc:   PainSc: 0-No pain                 Nhyla Nappi P Kristle Wesch

## 2020-07-28 NOTE — H&P (Signed)
History and Physical    [name]:[id number] DOB: Sep 05, 1981 DOA: 07/28/2020  PCP: Patient, No Pcp Per  Patient coming from: Home   Chief Complaint:  Chief Complaint  Patient presents with  . 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  Patient explains that she missed at least the past 2 sessions of dialysis.  Patient explains that yesterday evening she began to experience shortness of breath, severe in intensity, worse with exertion and improved with rest.  Shortness of breath is associated with pillow orthopnea.  Patient is also been experiencing associated dry severe cough.  Over the next several hours patient's shortness of breath rapidly progressed and became associated with chest pain with deep inspiration and cough.  Patient also began to develop intense nausea with frequent episodes of vomiting and lack of appetite.  Patient denies fevers, sick contacts, recent travel, leg swelling or confirmed contacts with COVID-19 infection.  Patient presented to [facility] emergency department for evaluation where she was initially found to have a potassium of greater than 7.5.  Patient was administered bicarbonate, Lokelma, insulin, dextrose and albuterol.  Repeat potassium was found to be 7.4.  Case was discussed with Dr. Candiss Norse with nephrology who is arranging for patient to undergo emergent dialysis as soon as the dialysis nurse is available this morning.  The hospitalist group was then called to assess the patient for  admission to the hospital.   Review of Systems: A 10-system review of systems has been performed and all systems are negative with the exception of what is listed in the HPI and the following:  GENERAL: Patient complaining of generalized weakness, pain at the site of the patient's left IJ tunneled dialysis catheter.   Past Medical History:  Diagnosis Date  . Anemia    low iron - receives iron at dialysis  . Chronic systolic congestive heart failure ([employer]) 03/16/2016  . Dyspnea   . ESRD (end stage renal disease) ([place])    [facility] _ [place]  . H/O pericarditis 01/17/2013  . H/O pleural effusion 01/17/2013  . Heart murmur   . Lupus (systemic lupus erythematosus) (HCC)    Previously followed with Dr. Charlestine Night, has not followed up recently  . 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 Pneumonia    many times  . Polysubstance abuse ([place])    cocaine, MJ, tobacco  . S/P pericardiocentesis 01/17/2013   H/o pericardial effusion with tamponade 2006   . Seizures ([place])    during pregnancy 1 time  . Streptococcal bacteremia 01/23/2013   She had two S. pneumonae bacteremia on 01/21/2013. Sensitive to Peniccilin     Past Surgical History:  Procedure Laterality Date  . AV FISTULA PLACEMENT    . BASCILIC VEIN TRANSPOSITION Left 02/05/2014   Procedure: [place];  Surgeon: [name], MD;  Location: [place];  Service: Vascular;  Laterality: Left;  . FISTULA SUPERFICIALIZATION Left 05/30/2018   Procedure: FISTULA PLICATION BASILIC  VEIN TRANSPOSITION;  Surgeon: Angelia Mould, MD;  Location: [facility];  Service: Vascular;  Laterality: Left;  . FISTULA SUPERFICIALIZATION Left 43/15/4008   Procedure: PLICATION OF LEFT ARTERIOVENOUS FISTULA ULCER;  Surgeon: [name], MD;  Location: [place];  Service: Vascular;  Laterality: Left;    . INSERTION OF DIALYSIS CATHETER N/A 05/19/2020   Procedure: TUNNELED INSERTION  OF DIALYSIS CATHETER;  Surgeon: Waynetta Sandy, MD;  Location: [place];  Service: Vascular;  Laterality: N/A;  . VENOGRAM Right 01/31/2014   Procedure: DIALYSIS CATHETER;  Surgeon: Serafina Mitchell, MD;  Location: [facility] CATH LAB;  Service: Cardiovascular;  Laterality: Right;     reports that she has been smoking cigarettes. She has a 1.80 pack-year smoking history. She has never used smokeless tobacco. She reports current alcohol use. She reports current drug use. Frequency: 5.00 times per week. Drug: Marijuana.  Allergies  Allergen Reactions  . Tobramycin Sulfate Swelling    Eye swelling    History reviewed. No pertinent family history.   Prior to Admission medications   Medication Sig Start Date End Date Taking? Authorizing Provider  acetaminophen (TYLENOL) 500 MG tablet Take by mouth. 07/20/18   [provider]  B Complex-C-Folic Acid (RENAL VITAMIN) 0.8 MG TABS Take 1 tablet by mouth daily.  10/03/19   [provider]  calcium acetate (PHOSLO) 667 MG capsule Take 1,334-2,668 mg by mouth See admin instructions. Take 4 capsule (2668 mg) by mouth three times a day with each meal and 2 capsules (1334 mg) with each snack    [provider]  carvedilol (COREG) 12.5 MG tablet Take 12.5 mg by mouth 2 (two) times daily. Take one tablet (12.5 mg) by mouth every morning, take one tablet (12.5 mg) at night on Sunday, Monday, Wednesday, Friday 11/07/19   [provider]  Cinacalcet HCl (SENSIPAR PO) Take 1 Dose by mouth daily.    [provider]  diltiazem (CARDIZEM) 30 MG tablet Take 1 tablet (30 mg total) by mouth every 6 (six) hours as needed (SVT). Patient taking differently: Take 30 mg by mouth 2 (two) times daily as needed (SVT).  07/19/18   Alphonzo Grieve, MD  diphenhydrAMINE (BENADRYL) 25 MG tablet Take 25-50 mg by mouth See admin instructions. Take one tablet (25  mg) by mouth daily at bedtime, take two tablets (50 mg) immediately prior to dialysis - Tuesday, Thursday, Saturday    [provider]  diphenhydramine-acetaminophen (TYLENOL PM) 25-500 MG TABS tablet Take 0.5-1 tablets by mouth at bedtime as needed (sleep).    [provider]  heparin 1000 unit/mL SOLN injection Heparin Sodium (Porcine) 1,000 Units/mL Catheter Lock Venous 05/21/20 06/05/21  [provider]  hydrOXYzine (ATARAX/VISTARIL) 10 MG tablet Take 1 tablet (10 mg total) by mouth 3 (three) times daily as needed for anxiety. Patient taking differently: Take 10 mg by mouth 3 (three) times daily as needed for itching or anxiety.  07/29/19   Caccavale, Sophia, PA-C  iron sucrose in sodium chloride 0.9 % 100 mL Iron Sucrose (Venofer) 06/13/20 06/12/21  [provider]  loperamide (IMODIUM A-D) 2 MG tablet Take 4 mg by mouth See admin instructions. Take two tablest (4 mg) by mouth on dialysis days (Tuesday, Thursday, Saturday)    [provider]  Methoxy PEG-Epoetin Beta (MIRCERA IJ) Inject 1 Dose as directed every 14 (fourteen) days. 03/16/20 03/15/21  [provider]  multivitamin (RENA-VIT) TABS tablet Take 1 tablet by mouth daily. Patient taking differently: Take 1  tablet by mouth at bedtime.  02/18/17   Holley Raring, MD  Nutritional Supplements (NOVASOURCE RENAL) LIQD Take 237 mLs by mouth See admin instructions. Take 1 container (237 mls) by at bedtime on dialysis days (Tuesday, Thursday, Saturday)    [provider]    Physical Exam: Vitals:   07/28/20 0406 07/28/20 0433 07/28/20 0500 07/28/20 0515  BP: (!) 147/94 (!) 124/88 (!) 129/111 (!) 140/94  Pulse: 90  79 (!) 37  Resp: 18 19 18 19   Temp: 98.6 F (37 C)     TempSrc: Oral     SpO2: 100%  98% 100%  Weight:      Height:        Constitutional: Acute alert and oriented x3, patient is in mild respiratory distress. Skin: no rashes, no lesions, extremely poor skin turgor  noted. Eyes: Pupils are equally reactive to light.  No evidence of scleral icterus or conjunctival pallor.  ENMT: Dry mucous membranes noted.  Posterior pharynx clear of any exudate or lesions.   Neck: normal, supple, no masses, no thyromegaly.  No evidence of jugular venous distension.   Respiratory: Notable rales in the bilateral lower and mid fields.  No wheezing, Normal respiratory effort. No accessory muscle use.  Cardiovascular: Tachycardic rate with regular rhythm.  Notable systolic murmur. No extremity edema. 2+ pedal pulses. No carotid bruits.  Chest:   Nontender without crepitus or deformity.   Back:   Nontender without crepitus or deformity. Abdomen: Abdomen is soft and nontender.  No evidence of intra-abdominal masses.  Positive bowel sounds noted in all quadrants.   Musculoskeletal: No joint deformity upper and lower extremities. Good ROM, no contractures. Normal muscle tone.  Neurologic: CN 2-12 grossly intact. Sensation intact, strength noted to be 5 out of 5 in all 4 extremities.  Patient is following all commands.  Patient is responsive to verbal stimuli.   Psychiatric: Patient presents as a normal mood with appropriate affect.  Patient seems to possess insight as to theircurrent situation.     Labs on Admission: I have personally reviewed following labs and imaging studies -   CBC: Recent Labs  Lab 07/25/20 0248 07/28/20 0420 07/28/20 0440  WBC 5.0 5.4  --   NEUTROABS  --  3.3  --   HGB 10.2* 10.3* 12.2  HCT 34.5* 34.9* 36.0  MCV 89.4 89.0  --   PLT 189 269  --    Basic Metabolic Panel: Recent Labs  Lab 07/25/20 0248 07/28/20 0420 07/28/20 0440  NA 135 135 132*  K 4.6 >7.5* 7.4*  CL 94* 91* 96*  CO2 21* 19*  --   GLUCOSE 83 91 86  BUN 89* 137* >130*  CREATININE 12.28* 18.31* >18.00*  CALCIUM 9.2 9.2  --    GFR: CrCl cannot be calculated (This lab value cannot be used to calculate CrCl because it is not a number: >18.00). Liver Function Tests: No  results for input(s): AST, ALT, ALKPHOS, BILITOT, PROT, ALBUMIN in the last 168 hours. No results for input(s): LIPASE, AMYLASE in the last 168 hours. No results for input(s): AMMONIA in the last 168 hours. Coagulation Profile: No results for input(s): INR, PROTIME in the last 168 hours. Cardiac Enzymes: No results for input(s): CKTOTAL, CKMB, CKMBINDEX, TROPONINI in the last 168 hours. BNP (last 3 results) No results for input(s): PROBNP in the last 8760 hours. HbA1C: No results for input(s): HGBA1C in the last 72 hours. CBG: Recent Labs  Lab 07/28/20 0516  GLUCAP 113*  Lipid Profile: No results for input(s): CHOL, HDL, LDLCALC, TRIG, CHOLHDL, LDLDIRECT in the last 72 hours. Thyroid Function Tests: No results for input(s): TSH, T4TOTAL, FREET4, T3FREE, THYROIDAB in the last 72 hours. Anemia Panel: No results for input(s): VITAMINB12, FOLATE, FERRITIN, TIBC, IRON, RETICCTPCT in the last 72 hours. Urine analysis:    Component Value Date/Time   COLORURINE YELLOW 09/25/2015 1412   APPEARANCEUR CLOUDY (A) 09/25/2015 1412   LABSPEC 1.018 09/25/2015 1412   PHURINE 8.0 09/25/2015 1412   GLUCOSEU NEGATIVE 09/25/2015 1412   HGBUR LARGE (A) 09/25/2015 1412   BILIRUBINUR NEGATIVE 09/25/2015 1412   KETONESUR NEGATIVE 09/25/2015 1412   PROTEINUR >300 (A) 09/25/2015 1412   UROBILINOGEN 0.2 09/25/2015 1412   NITRITE NEGATIVE 09/25/2015 1412   LEUKOCYTESUR MODERATE (A) 09/25/2015 1412    Radiological Exams on Admission - Personally Reviewed: DG Chest 2 View  Result Date: 07/28/2020 CLINICAL DATA:  Chest pain while laying flat. EXAM: CHEST - 2 VIEW COMPARISON:  05/19/2020 FINDINGS: Moderate enlargement of the cardiopericardial silhouette. No mediastinal or hilar masses or evidence of adenopathy. Lungs are hyperexpanded. Mild stable interstitial thickening. No evidence of pneumonia or pulmonary edema. No pleural effusion or pneumothorax. Left internal jugular tunneled dual lumen central  venous catheter is stable, tip in the lower superior vena cava. Skeletal structures are intact. IMPRESSION: 1. No acute cardiopulmonary disease. 2. Stable cardiomegaly. Electronically Signed   By: Lajean Manes M.D.   On: 07/28/2020 04:34    EKG: Personally reviewed.  Rhythm is sinus tachycardia with heart rate of 166 bpm.  Evidence of extreme first-degree AV block and extremely peaked T waves consistent with hyperkalemia in the setting of right bundle branch block.   Assessment/Plan Principal Problem:   Acute hyperkalemia   Patient presenting with life-threatening hyperkalemia secondary to several sessions of missed dialysis  Patient additionally seems to be suffering from associated volume overload with shortness of breath and bilateral pulmonary rales on examination.  Evidence of EKG changes secondary to hyperkalemia  Patient is already been administered multiple appropriate agents including Lokelma, insulin, dextrose, bicarbonate, calcium gluconate and albuterol.  Case has already been discussed with Dr. Candiss Norse by the emergency department staff who is arranging for emergent dialysis upon availability of hemodialysis nursing this morning.  Monitoring patient on telemetry in the meantime  Last known potassium is 7.4 which will be repeated status post dialysis.  Patient will be admitted to the stepdown unit after dialysis for continued monitoring  Active Problems: Bleeding complication of left hemodialysis fistula   Patient presented to our emergency department on 7/29 due to bleeding ulceration of her left hemodialysis fistula which is currently not being used.  Hemostasis was achieved during that emergency department stay with a pressure dressing.  Patient was discharged home the time and advised to follow-up with vascular surgery.  Patient is currently requesting that the dressing be removed however I do not believe that this is wise considering the degree of platelet dysfunction  the patient is likely experiencing with her several rounds of missed dialysis.  Furthermore, patient would likely best be served by following up as an outpatient with vascular surgery.  If dressing is soiled it can be quickly exchanged prior to discharge.  Hemodialysis catheter complication   Patient claims that she is experiencing pain at the left tunneled IJ hemodialysis catheter site.  Emergency department provider has already obtained blood cultures   Based on my examination, palpation along tunneled catheter does not reveal any induration, warmth or tenderness.  Patient does not exhibit any leukocytosis or fever.  We will abstain from any initiation of antibiotic therapy unless cultures come back positive.    SLE (systemic lupus erythematosus) ([place])   Outpatient follow-up, not currently on immunomodulating therapy.    Chronic systolic and diastolic congestive heart failure (HCC)   While patient is suffering from acute volume overload this is noncardiogenic    ESRD (end stage renal disease) on dialysis [facility])   See assessment and plan above    Essential hypertension   Continue home regimen of Coreg    Polysubstance abuse ([place])   Patient admits to daily marijuana use and intermittent crack cocaine use  Counseling patient on cessation daily    Nicotine dependence, cigarettes, uncomplicated  Counseling patient on smoking cessation daily.  Patient declines nicotine replacement therapy while hospitalized.  \  Code Status:  Full code Family Communication: deferred   Status is: Observation  The patient remains OBS appropriate and will d/c before 2 midnights.  Dispo: The patient is from: Home              Anticipated d/c is to: Home              Anticipated d/c date is: 2 days              Patient currently is not medically stable to d/c.        Vernelle Emerald MD [facility] Pager [phone]  If 7PM-7AM, please contact  night-coverage [email or web address] Use universal [place] password for that web site. If you do not have the password, please call the hospital operator.  07/28/2020, 5:52 AM

## 2020-07-28 NOTE — Op Note (Signed)
    NAME: [name]    MRN: [id number] DOB: 1981/09/03    DATE OF OPERATION: 07/28/2020  PREOP DIAGNOSIS:    Bleeding from left upper arm AV fistula  POSTOP DIAGNOSIS:    Same  PROCEDURE:    Oversewing of left upper arm brachiocephalic fistula  SURGEON: Judeth Cornfield. Scot Dock, MD  ASSIST: Karoline Caldwell, PA  ANESTHESIA: General  EBL: 200 cc  INDICATIONS:    [name] is a 39 y.o. female who was in dialysis and developed significant hemorrhage from her central fistula.  This was difficult to control and I was consulted urgently.  I was able to hold digital pressure and control the bleeding.  The bleeding was fairly significant and therefore I felt the safest approach was immediate transfer to the operating room.  FINDINGS:   The fistula was heavily calcified throughout with clearly no options to salvage the fistula.  This was oversewn proximally and distally.  TECHNIQUE:   Patient was taken urgently to the operating room with me holding pressure on the bleeding site.  The patient received a general anesthetic.  We then placed a tourniquet on the upper arm and the patient was heparinized.  The tourniquet was inflated to 250 mmHg.  With the bleeding now controlled the left arm was prepped and draped in usual sterile fashion up to the tourniquet.  An ellipse of skin was excised and here with the bleeding site was the vein was heavily calcified and stopped.  I did not see any way to dissected this free to try to salvage the fistula.  I therefore elected to get proximal distal control.  A longitudinal incision was made over the proximal fistula where again the fistula was aneurysmal and calcified.  I then made a transverse incision just below the tourniquet and dissected out the vein where again it was heavily calcified.  I divided the calcified segment allowing me to place a clamp proximally.  Distally I was able to see the proximal fistula and this was oversewn with a 5-0  Prolene suture.  The tourniquet was then released.  There was a good radial and ulnar signal with the Doppler at the completion.  At the central portion the vein was ligated with a 2-0 silk tie.  The heavily calcified segments were excised as best I could.  The heparin was partially reversed with protamine.  The incision just below the tourniquet was closed with 3 interrupted nylon sutures.  The area that had bled was closed with interrupted 3-0 nylon sutures.  The distal segment above the antecubital level was closed with a deep layer of 3-0 Vicryl and the skin closed with 3-0 nylon's.  Sterile dressing was applied.  Patient tolerated procedure well and was transferred to recovery in stable condition.  All needle and sponge counts were correct.  Given the complexity of the case a first assistant was necessary in order to expedient the procedure and safely perform the technical aspects of the operation.  Deitra Mayo, MD, FACS [facility]  DATE OF DICTATION:   07/28/2020

## 2020-07-28 NOTE — Progress Notes (Signed)
Pt arrived from unit from PACU. VSS.Will continue to monitor. Pt. Oriented to unit   Cranston Koors K Thorsten Climer, RN  

## 2020-07-29 ENCOUNTER — Encounter (HOSPITAL_COMMUNITY): Payer: Self-pay | Admitting: Vascular Surgery

## 2020-07-29 ENCOUNTER — Observation Stay (HOSPITAL_COMMUNITY): Payer: Medicaid Other

## 2020-07-29 ENCOUNTER — Observation Stay (HOSPITAL_BASED_OUTPATIENT_CLINIC_OR_DEPARTMENT_OTHER): Payer: Medicaid Other

## 2020-07-29 DIAGNOSIS — E875 Hyperkalemia: Secondary | ICD-10-CM | POA: Diagnosis not present

## 2020-07-29 DIAGNOSIS — N186 End stage renal disease: Secondary | ICD-10-CM

## 2020-07-29 DIAGNOSIS — Z01810 Encounter for preprocedural cardiovascular examination: Secondary | ICD-10-CM | POA: Diagnosis not present

## 2020-07-29 LAB — COMPREHENSIVE METABOLIC PANEL WITH GFR
ALT: 12 U/L (ref 0–44)
AST: 28 U/L (ref 15–41)
Albumin: 2.1 g/dL — ABNORMAL LOW (ref 3.5–5.0)
Alkaline Phosphatase: 146 U/L — ABNORMAL HIGH (ref 38–126)
Anion gap: 13 (ref 5–15)
BUN: 77 mg/dL — ABNORMAL HIGH (ref 6–20)
CO2: 27 mmol/L (ref 22–32)
Calcium: 9.1 mg/dL (ref 8.9–10.3)
Chloride: 97 mmol/L — ABNORMAL LOW (ref 98–111)
Creatinine, Ser: 11.66 mg/dL — ABNORMAL HIGH (ref 0.44–1.00)
GFR calc Af Amer: 4 mL/min — ABNORMAL LOW
GFR calc non Af Amer: 4 mL/min — ABNORMAL LOW
Glucose, Bld: 133 mg/dL — ABNORMAL HIGH (ref 70–99)
Potassium: 4.8 mmol/L (ref 3.5–5.1)
Sodium: 137 mmol/L (ref 135–145)
Total Bilirubin: 0.4 mg/dL (ref 0.3–1.2)
Total Protein: 7 g/dL (ref 6.5–8.1)

## 2020-07-29 LAB — CBC WITH DIFFERENTIAL/PLATELET
Abs Immature Granulocytes: 0.01 10*3/uL (ref 0.00–0.07)
Basophils Absolute: 0 10*3/uL (ref 0.0–0.1)
Basophils Relative: 1 %
Eosinophils Absolute: 0 10*3/uL (ref 0.0–0.5)
Eosinophils Relative: 1 %
HCT: 28.6 % — ABNORMAL LOW (ref 36.0–46.0)
Hemoglobin: 8.7 g/dL — ABNORMAL LOW (ref 12.0–15.0)
Immature Granulocytes: 0 %
Lymphocytes Relative: 21 %
Lymphs Abs: 0.8 10*3/uL (ref 0.7–4.0)
MCH: 27 pg (ref 26.0–34.0)
MCHC: 30.4 g/dL (ref 30.0–36.0)
MCV: 88.8 fL (ref 80.0–100.0)
Monocytes Absolute: 0.7 10*3/uL (ref 0.1–1.0)
Monocytes Relative: 19 %
Neutro Abs: 2.1 10*3/uL (ref 1.7–7.7)
Neutrophils Relative %: 58 %
Platelets: 242 10*3/uL (ref 150–400)
RBC: 3.22 MIL/uL — ABNORMAL LOW (ref 3.87–5.11)
RDW: 19.2 % — ABNORMAL HIGH (ref 11.5–15.5)
WBC: 3.7 10*3/uL — ABNORMAL LOW (ref 4.0–10.5)
nRBC: 0.8 % — ABNORMAL HIGH (ref 0.0–0.2)

## 2020-07-29 LAB — MAGNESIUM: Magnesium: 2.4 mg/dL (ref 1.7–2.4)

## 2020-07-29 NOTE — Plan of Care (Signed)
POC initiated and progressing. 

## 2020-07-29 NOTE — Progress Notes (Signed)
VASCULAR LAB    Right upper extremity vein mapping completed.    Preliminary report:  See CV proc for preliminary results.  Makhya Arave, RVT 07/29/2020, 10:55 AM

## 2020-07-29 NOTE — Progress Notes (Signed)
VASCULAR LAB    Right upper extremity arterial duplex completed.    Preliminary report:  See CV proc for preliminary results.  Carleena Mires, RVT 07/29/2020, 10:54 AM

## 2020-07-29 NOTE — Progress Notes (Signed)
   VASCULAR SURGERY ASSESSMENT & PLAN:   POD 1 OVERSEWING OF LEFT AV FISTULA: Her fistula was severely calcified throughout with no options for salvage.  She had multiple bleeding episodes.  I will order a right upper extremity vein mapping and arterial duplex for preoperative planning.  However given all the problems she has had in the left arm I would favor letting her recover from this before scheduling elective placement of access in the right arm as an outpatient.  SUBJECTIVE:   No complaints this morning  PHYSICAL EXAM:   Vitals:   07/28/20 1700 07/28/20 2058 07/28/20 2347 07/29/20 0445  BP: (!) 143/86 (!) 145/92 120/82 123/77  Pulse: 93 85 71 82  Resp: 17 16 20 14   Temp:  97.8 F (36.6 C) 98 F (36.7 C) 97.7 F (36.5 C)  TempSrc:  Oral Oral Oral  SpO2: 98% 96% 98% 99%  Weight:      Height:       I changed her dressing and her incisions look fine. Palpable left radial pulse.  LABS:   Lab Results  Component Value Date   WBC 3.7 (L) 07/29/2020   HGB 8.7 (L) 07/29/2020   HCT 28.6 (L) 07/29/2020   MCV 88.8 07/29/2020   PLT 242 07/29/2020   Lab Results  Component Value Date   CREATININE 11.66 (H) 07/29/2020   Lab Results  Component Value Date   INR 1.14 06/30/2018   CBG (last 3)  Recent Labs    07/28/20 0516 07/28/20 1151 07/28/20 1301  GLUCAP 113* 79 77    PROBLEM LIST:    Principal Problem:   Acute hyperkalemia Active Problems:   SLE (systemic lupus erythematosus) (HCC)   Chronic combined systolic (congestive) and diastolic (congestive) heart failure (HCC)   ESRD (end stage renal disease) on dialysis (HCC)   Nicotine dependence, cigarettes, uncomplicated   Essential hypertension   Polysubstance abuse ([place])   Hemodialysis catheter malfunction (HCC)   Complication of AV dialysis fistula, sequela   CURRENT MEDS:   . sodium chloride   Intravenous Once  . calcitRIOL  2 mcg Oral Q T,Th,Sa-HD  . calcium acetate  1,334 mg Oral With snacks  .  calcium acetate  2,668 mg Oral TID WC  . carvedilol  12.5 mg Oral Daily  . carvedilol  12.5 mg Oral Once per day on Sun Mon Wed Fri  . Chlorhexidine Gluconate Cloth  6 each Topical Q0600  . cinacalcet  30 mg Oral Q breakfast  . [START ON 07/30/2020] darbepoetin (ARANESP) injection - DIALYSIS  60 mcg Intravenous Q Tue-HD  . heparin  5,000 Units Subcutaneous Q8H  . hydrOXYzine  10 mg Oral Once  . [START ON 07/30/2020] loperamide  4 mg Oral Once per day on Tue Thu Sat  . multivitamin  1 tablet Oral QHS    [name] Office: [phone] 07/29/2020

## 2020-07-29 NOTE — Progress Notes (Signed)
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  Admitted early this AM with severe hyperkalemia after missing sessions of hemodialysis.  Urgently hemodialysis for K+ 7.4.  Bleeding from fistula needing intervention.  Vascular surgery taken for oversewing of left arm brachial cephalic fistula on 03/04/03 unable to save fistula.  07/29/20: Left arm a little sore.  No other complaints.  Plan for venous mapping by vascular surgery.    Assessment/Plan: Principal Problem:   Acute hyperkalemia Active Problems:   SLE (systemic lupus erythematosus) (HCC)   Chronic combined systolic (congestive) and diastolic (congestive) heart failure (HCC)   ESRD (end stage renal disease) on dialysis (HCC)   Nicotine dependence, cigarettes, uncomplicated   Essential hypertension   Polysubstance abuse ([place])   Hemodialysis catheter malfunction ([place])   Complication of AV dialysis fistula, sequela  Severe hyperkalemia secondary to missing 2 hemodialysis sessions in the setting of ESRD HD TTS Urgently taken for dialysis on 07/28/20 Hyperkalemia has resolved post hemodialysis Bleeding from fistula needing intervention.  Vascular surgery taken for oversewing of left arm brachial cephalic fistula on 08/04/88 unable to save fistula. Plan for venous mapping by vascular surgery.  ESRD on HD TTS Noncompliance Nephrology following Vascular surgery following for hemodialysis access. Post oversewing of  left AV fistula on 07/28/2020, unable to save. Plan vein mapping today by vascular surgery.  Chronic combined systolic and diastolic CHF Last 2D echo done on 02/17/2017 showed LVEF 40 to 45% and grade 2 diastolic dysfunction Volume status addressed with hemodialysis Currently on Coreg  SLE Not currently on immunomodulating therapy  Hx of polysubstance abuse including tobacco, cocaine and marijuana  No UDS done on admission Counseling at bedside     Code Status: Full code  Family Communication: None at bedside   Consultants:  Nephrology  Vascular surgery  Procedures:  Oversewing of left AV fistula  Antimicrobials:  None  DVT prophylaxis: Subcu heparin 3 times daily  Status is: Observation    Dispo:  Patient From: Home  Planned Disposition: Home  Expected discharge date: 07/30/20  Medically stable for discharge: No, ongoing management for hemodialysis access.    Objective: Vitals:   07/29/20 0800 07/29/20 0902 07/29/20 1225 07/29/20 1527  BP: (!) 134/80  113/73 135/89  Pulse:  85 69   Resp: 12  15 16   Temp: 97.6 F (36.4 C)  (!) 97.5 F (36.4 C) 97.7 F (36.5 C)  TempSrc: Oral  Oral Oral  SpO2: 96%  97% 96%  Weight:      Height:        Intake/Output Summary (Last 24 hours) at 07/29/2020 1732 Last data filed at 07/29/2020 1552 Gross per 24 hour  Intake 360 ml  Output --  Net 360 ml   Filed Weights   07/28/20 0404 07/28/20 0744 07/28/20 0954  Weight: 45 kg 46.9 kg 46.9 kg    Exam:  . General: 39 y.o. year-old female well developed well nourished in no acute distress.  Alert and oriented x3. . Cardiovascular: Regular rate and rhythm with no rubs or gallops.  No thyromegaly  or JVD noted.   Marland Kitchen Respiratory: Clear to auscultation with no wheezes or rales. Good inspiratory effort. . Abdomen: Soft nontender nondistended with normal bowel sounds x4 quadrants. . Musculoskeletal: No lower extremity edema bilaterally. Marland Kitchen Psychiatry: Mood is appropriate  for condition and setting   Data Reviewed: CBC: Recent Labs  Lab 07/25/20 0248 07/25/20 0248 07/28/20 0420 07/28/20 0420 07/28/20 0440 07/28/20 1022 07/28/20 1055 07/28/20 1630 07/29/20 0342  WBC 5.0  --  5.4  --   --   --   --  3.9* 3.7*  NEUTROABS  --   --  3.3  --   --   --   --   --  2.1  HGB 10.2*   < > 10.3*   < > 12.2 11.2* 9.9* 9.1* 8.7*  HCT 34.5*   < > 34.9*   < > 36.0 33.0* 29.0* 30.7* 28.6*  MCV 89.4  --  89.0  --   --   --   --  88.2 88.8  PLT 189  --  269  --   --   --   --  222 242   < > = values in this interval not displayed.   Basic Metabolic Panel: Recent Labs  Lab 07/25/20 0248 07/25/20 0248 07/28/20 0420 07/28/20 0420 07/28/20 0440 07/28/20 1022 07/28/20 1055 07/28/20 1630 07/29/20 0342  NA 135   < > 135   < > 132* 137 138 136 137  K 4.6   < > >7.5*   < > 7.4* 3.4* 2.9* 4.5 4.8  CL 94*   < > 91*   < > 96* 94* 97* 97* 97*  CO2 21*  --  19*  --   --   --   --  21* 27  GLUCOSE 83   < > 91   < > 86 333* 129* 158* 133*  BUN 89*   < > 137*   < > >130* 55* 54* 62* 77*  CREATININE 12.28*   < > 18.31*   < > >18.00* 9.60* 10.00* 10.48* 11.66*  CALCIUM 9.2  --  9.2  --   --   --   --  8.8* 9.1  MG  --   --   --   --   --   --   --   --  2.4   < > = values in this interval not displayed.   GFR: Estimated Creatinine Clearance: 4.9 mL/min (A) (by C-G formula based on SCr of 11.66 mg/dL (H)). Liver Function Tests: Recent Labs  Lab 07/29/20 0342  AST 28  ALT 12  ALKPHOS 146*  BILITOT 0.4  PROT 7.0  ALBUMIN 2.1*   No results for input(s): LIPASE, AMYLASE in the last 168 hours. No results for input(s): AMMONIA in the last 168 hours. Coagulation Profile: No results for input(s): INR, PROTIME in the last 168 hours. Cardiac Enzymes: No results for input(s): CKTOTAL, CKMB, CKMBINDEX, TROPONINI in the last 168 hours. BNP (last 3 results) No results for input(s): PROBNP in the last 8760 hours. HbA1C: No results for input(s): HGBA1C in the last 72  hours. CBG: Recent Labs  Lab 07/28/20 0516 07/28/20 1151 07/28/20 1301  GLUCAP 113* 79 77   Lipid Profile: No results for input(s): CHOL, HDL, LDLCALC, TRIG, CHOLHDL, LDLDIRECT in the last 72 hours. Thyroid Function Tests: No results for input(s): TSH, T4TOTAL, FREET4, T3FREE, THYROIDAB in the last 72 hours. Anemia Panel: No results for input(s): VITAMINB12, FOLATE, FERRITIN, TIBC,  IRON, RETICCTPCT in the last 72 hours. Urine analysis:    Component Value Date/Time   COLORURINE YELLOW 09/25/2015 1412   APPEARANCEUR CLOUDY (A) 09/25/2015 1412   LABSPEC 1.018 09/25/2015 1412   PHURINE 8.0 09/25/2015 1412   GLUCOSEU NEGATIVE 09/25/2015 1412   HGBUR LARGE (A) 09/25/2015 1412   BILIRUBINUR NEGATIVE 09/25/2015 1412   KETONESUR NEGATIVE 09/25/2015 1412   PROTEINUR >300 (A) 09/25/2015 1412   UROBILINOGEN 0.2 09/25/2015 1412   NITRITE NEGATIVE 09/25/2015 1412   LEUKOCYTESUR MODERATE (A) 09/25/2015 1412   Sepsis Labs: @LABRCNTIP (procalcitonin:4,lacticidven:4)  ) Recent Results (from the past 240 hour(s))  SARS Coronavirus 2 by RT PCR (hospital order, performed in [place] hospital lab) Nasopharyngeal Nasopharyngeal Swab     Status: None   Collection Time: 07/28/20  4:43 AM   Specimen: Nasopharyngeal Swab  Result Value Ref Range Status   SARS Coronavirus 2 NEGATIVE NEGATIVE Final    Comment: (NOTE) SARS-CoV-2 target nucleic acids are NOT DETECTED.  The SARS-CoV-2 RNA is generally detectable in upper and lower respiratory specimens during the acute phase of infection. The lowest concentration of SARS-CoV-2 viral copies this assay can detect is 250 copies / mL. A negative result does not preclude SARS-CoV-2 infection and should not be used as the sole basis for treatment or other patient management decisions.  A negative result may occur with improper specimen collection / handling, submission of specimen other than nasopharyngeal swab, presence of viral mutation(s) within  the areas targeted by this assay, and inadequate number of viral copies (<250 copies / mL). A negative result must be combined with clinical observations, patient history, and epidemiological information.  Fact Sheet for Patients:   StrictlyIdeas.no  Fact Sheet for Healthcare Providers: BankingDealers.co.za  This test is not yet approved or  cleared by the [place] FDA and has been authorized for detection and/or diagnosis of SARS-CoV-2 by FDA under an Emergency Use Authorization (EUA).  This EUA will remain in effect (meaning this test can be used) for the duration of the COVID-19 declaration under Section 564(b)(1) of the Act, 21 U.S.C. section 360bbb-3(b)(1), unless the authorization is terminated or revoked sooner.  Performed at [facility], [place] [address], [place], [place] [address]   Blood culture (routine x 2)     Status: None (Preliminary result)   Collection Time: 07/28/20  6:50 AM   Specimen: BLOOD LEFT WRIST  Result Value Ref Range Status   Specimen Description BLOOD LEFT WRIST  Final   Special Requests   Final    BOTTLES DRAWN AEROBIC AND ANAEROBIC Blood Culture results may not be optimal due to an inadequate volume of blood received in culture bottles   Culture   Final    NO GROWTH 1 DAY Performed at [facility], [place] [address], [place], [place] [address]    Report Status PENDING  Incomplete  Blood culture (routine x 2)     Status: None (Preliminary result)   Collection Time: 07/28/20  6:57 AM   Specimen: BLOOD LEFT HAND  Result Value Ref Range Status   Specimen Description BLOOD LEFT HAND  Final   Special Requests   Final    BOTTLES DRAWN AEROBIC AND ANAEROBIC Blood Culture results may not be optimal due to an inadequate volume of blood received in culture bottles   Culture   Final    NO GROWTH 1 DAY Performed at [facility], [place] [address], [place], [place] [address]    Report Status  PENDING  Incomplete      Studies: VAS [name] UPPER EXTREMITY ARTERIAL DUPLEX  Result Date: 07/29/2020 UPPER EXTREMITY DUPLEX STUDY Indications: Patient complains of Pre Dialysis access.  Risk Factors:  Hypertension. Other Factors: Lupus, CHF, polysubstance abuse, non compliant with dialysis,                failed left dialysis access Limitations: IV at wrist Comparison Study: No prior study on file Performing Technologist: Sharion Dove RVS  Examination Guidelines: A complete evaluation includes B-mode imaging, spectral Doppler, color Doppler, and power Doppler as needed of all accessible portions of each vessel. Bilateral testing is considered an integral part of a complete examination. Limited examinations for reoccurring indications may be performed as noted.  Right Doppler Findings: +---------------+----------+---------+--------+--------+ Site           PSV (cm/s)Waveform StenosisComments +---------------+----------+---------+--------+--------+ Subclavian Dist                                    +---------------+----------+---------+--------+--------+ Brachial Dist  68        triphasic                 +---------------+----------+---------+--------+--------+ Radial Dist    90        triphasic                 +---------------+----------+---------+--------+--------+ Ulnar Dist     72        triphasic                 +---------------+----------+---------+--------+--------+  Right Pre-Dialysis Findings: +-----------------------+----------+--------------------+---------+--------+ Location               PSV (cm/s)Intralum. Diam. (cm)Waveform Comments +-----------------------+----------+--------------------+---------+--------+ Brachial Antecub. fossa68        0.46                triphasic         +-----------------------+----------+--------------------+---------+--------+ Radial Art at Wrist    90        0.24                triphasic          +-----------------------+----------+--------------------+---------+--------+ Ulnar Art at Wrist     72        0.17                triphasic         +-----------------------+----------+--------------------+---------+--------+    Preliminary    VAS [name] UPPER EXT VEIN MAPPING (PRE-OP AVF)  Result Date: 07/29/2020 UPPER EXTREMITY VEIN MAPPING  Indications: Pre-access. History: Lupus, CHF, Hypertension, polysubstance abuse, non compliant with          dialysis, failed left dialysis access.  Limitations: IV at right wrist. Comparison Study: No prior study on file Performing Technologist: Sharion Dove RVS  Examination Guidelines: A complete evaluation includes B-mode imaging, spectral Doppler, color Doppler, and power Doppler as needed of all accessible portions of each vessel. Bilateral testing is considered an integral part of a complete examination. Limited examinations for reoccurring indications may be performed as noted. +-----------------+-------------+----------+----------------+ Right Cephalic   Diameter (cm)Depth (cm)    Findings     +-----------------+-------------+----------+----------------+ Prox upper arm       0.23        0.17                    +-----------------+-------------+----------+----------------+ Mid upper arm  0.21        0.14      branching     +-----------------+-------------+----------+----------------+ Dist upper arm       0.23        0.12      branching     +-----------------+-------------+----------+----------------+ Antecubital fossa    0.18        0.14      branching     +-----------------+-------------+----------+----------------+ Prox forearm         0.16        0.11      branching     +-----------------+-------------+----------+----------------+ Mid forearm          0.14        0.13                    +-----------------+-------------+----------+----------------+ Wrist                1.51        0.10   branching and IV  +-----------------+-------------+----------+----------------+ +-----------------+-------------+----------+---------+ Right Basilic    Diameter (cm)Depth (cm)Findings  +-----------------+-------------+----------+---------+ Mid upper arm        0.48        0.61    origin   +-----------------+-------------+----------+---------+ Dist upper arm       0.26        0.61             +-----------------+-------------+----------+---------+ Antecubital fossa    0.25        0.30             +-----------------+-------------+----------+---------+ Prox forearm         0.18        0.25             +-----------------+-------------+----------+---------+ Mid forearm          0.23        0.26   branching +-----------------+-------------+----------+---------+ Distal forearm       0.22        0.33             +-----------------+-------------+----------+---------+ Wrist                0.10        0.16             +-----------------+-------------+----------+---------+ *See table(s) above for measurements and observations.  Diagnosing physician: Ruta Hinds MD Electronically signed by Ruta Hinds MD on 07/29/2020 at 3:50:03 PM.    Final     Scheduled Meds: . sodium chloride   Intravenous Once  . calcitRIOL  2 mcg Oral Q T,Th,Sa-HD  . calcium acetate  1,334 mg Oral With snacks  . calcium acetate  2,668 mg Oral TID WC  . carvedilol  12.5 mg Oral Daily  . carvedilol  12.5 mg Oral Once per day on Sun Mon Wed Fri  . Chlorhexidine Gluconate Cloth  6 each Topical Q0600  . cinacalcet  30 mg Oral Q breakfast  . [START ON 07/30/2020] darbepoetin (ARANESP) injection - DIALYSIS  60 mcg Intravenous Q Tue-HD  . heparin  5,000 Units Subcutaneous Q8H  . hydrOXYzine  10 mg Oral Once  . [START ON 07/30/2020] loperamide  4 mg Oral Once per day on Tue Thu Sat  . multivitamin  1 tablet Oral QHS    Continuous Infusions:   LOS: 0 days     Kayleen Memos, MD [facility] Pager [phone]  If  7PM-7AM, please contact night-coverage [email or web address] Password [facility] 07/29/2020, 5:32 PM

## 2020-07-29 NOTE — Progress Notes (Signed)
Patient ID: [name], female   DOB: June 28, 1981, 39 y.o.   MRN: [id number] S:Tolerated ligation of AVF with improved swelling. O:BP 113/73 (BP Location: Right Arm)   Pulse 69   Temp (!) 97.5 F (36.4 C) (Oral)   Resp 15   Ht 5\' 5"  (1.651 m)   Wt 46.9 kg   SpO2 97%   BMI 17.21 kg/m   Intake/Output Summary (Last 24 hours) at 07/29/2020 1417 Last data filed at 07/29/2020 6160 Gross per 24 hour  Intake 240 ml  Output --  Net 240 ml   Intake/Output: I/O last 3 completed shifts: In: 800 [I.V.:800] Out: 0 [Blood:200]  Intake/Output this shift:  Total I/O In: 240 [P.O.:240] Out: -  Weight change: 1.9 kg Gen: NAD CVS: no rub Resp: cta Abd: +BS, soft, NT/ND Ext: no edema, left arm wrapped  Recent Labs  Lab 07/25/20 0248 07/28/20 0420 07/28/20 0440 07/28/20 1022 07/28/20 1055 07/28/20 1630 07/29/20 0342  NA 135 135 132* 137 138 136 137  K 4.6 >7.5* 7.4* 3.4* 2.9* 4.5 4.8  CL 94* 91* 96* 94* 97* 97* 97*  CO2 21* 19*  --   --   --  21* 27  GLUCOSE 83 91 86 333* 129* 158* 133*  BUN 89* 137* >130* 55* 54* 62* 77*  CREATININE 12.28* 18.31* >18.00* 9.60* 10.00* 10.48* 11.66*  ALBUMIN  --   --   --   --   --   --  2.1*  CALCIUM 9.2 9.2  --   --   --  8.8* 9.1  AST  --   --   --   --   --   --  28  ALT  --   --   --   --   --   --  12   Liver Function Tests: Recent Labs  Lab 07/29/20 0342  AST 28  ALT 12  ALKPHOS 146*  BILITOT 0.4  PROT 7.0  ALBUMIN 2.1*   No results for input(s): LIPASE, AMYLASE in the last 168 hours. No results for input(s): AMMONIA in the last 168 hours. CBC: Recent Labs  Lab 07/25/20 0248 07/25/20 0248 07/28/20 0420 07/28/20 0440 07/28/20 1055 07/28/20 1630 07/29/20 0342  WBC 5.0   < > 5.4  --   --  3.9* 3.7*  NEUTROABS  --   --  3.3  --   --   --  2.1  HGB 10.2*   < > 10.3*   < > 9.9* 9.1* 8.7*  HCT 34.5*   < > 34.9*   < > 29.0* 30.7* 28.6*  MCV 89.4  --  89.0  --   --  88.2 88.8  PLT 189   < > 269  --   --  222 242   < > =  values in this interval not displayed.   Cardiac Enzymes: No results for input(s): CKTOTAL, CKMB, CKMBINDEX, TROPONINI in the last 168 hours. CBG: Recent Labs  Lab 07/28/20 0516 07/28/20 1151 07/28/20 1301  GLUCAP 113* 79 77    Iron Studies: No results for input(s): IRON, TIBC, TRANSFERRIN, FERRITIN in the last 72 hours. Studies/Results: DG Chest 2 View  Result Date: 07/28/2020 CLINICAL DATA:  Chest pain while laying flat. EXAM: CHEST - 2 VIEW COMPARISON:  05/19/2020 FINDINGS: Moderate enlargement of the cardiopericardial silhouette. No mediastinal or hilar masses or evidence of adenopathy. Lungs are hyperexpanded. Mild stable interstitial thickening. No evidence of pneumonia or pulmonary edema. No pleural effusion  or pneumothorax. Left internal jugular tunneled dual lumen central venous catheter is stable, tip in the lower superior vena cava. Skeletal structures are intact. IMPRESSION: 1. No acute cardiopulmonary disease. 2. Stable cardiomegaly. Electronically Signed   By: Lajean Manes M.D.   On: 07/28/2020 04:34   VAS [name] UPPER EXTREMITY ARTERIAL DUPLEX  Result Date: 07/29/2020 UPPER EXTREMITY DUPLEX STUDY Indications: Patient complains of Pre Dialysis access.  Risk Factors:  Hypertension. Other Factors: Lupus, CHF, polysubstance abuse, non compliant with dialysis,                failed left dialysis access Limitations: IV at wrist Comparison Study: No prior study on file Performing Technologist: Sharion Dove RVS  Examination Guidelines: A complete evaluation includes B-mode imaging, spectral Doppler, color Doppler, and power Doppler as needed of all accessible portions of each vessel. Bilateral testing is considered an integral part of a complete examination. Limited examinations for reoccurring indications may be performed as noted.  Right Doppler Findings: +---------------+----------+---------+--------+--------+ Site           PSV (cm/s)Waveform StenosisComments  +---------------+----------+---------+--------+--------+ Subclavian Dist                                    +---------------+----------+---------+--------+--------+ Brachial Dist  68        triphasic                 +---------------+----------+---------+--------+--------+ Radial Dist    90        triphasic                 +---------------+----------+---------+--------+--------+ Ulnar Dist     72        triphasic                 +---------------+----------+---------+--------+--------+  Right Pre-Dialysis Findings: +-----------------------+----------+--------------------+---------+--------+ Location               PSV (cm/s)Intralum. Diam. (cm)Waveform Comments +-----------------------+----------+--------------------+---------+--------+ Brachial Antecub. fossa68        0.46                triphasic         +-----------------------+----------+--------------------+---------+--------+ Radial Art at Wrist    90        0.24                triphasic         +-----------------------+----------+--------------------+---------+--------+ Ulnar Art at Wrist     72        0.17                triphasic         +-----------------------+----------+--------------------+---------+--------+    Preliminary    VAS [name] UPPER EXT VEIN MAPPING (PRE-OP AVF)  Result Date: 07/29/2020 UPPER EXTREMITY VEIN MAPPING  Indications: Pre-access. History: Lupus, CHF, Hypertension, polysubstance abuse, non compliant with          dialysis, failed left dialysis access.  Limitations: IV at right wrist. Comparison Study: No prior study on file Performing Technologist: Sharion Dove RVS  Examination Guidelines: A complete evaluation includes B-mode imaging, spectral Doppler, color Doppler, and power Doppler as needed of all accessible portions of each vessel. Bilateral testing is considered an integral part of a complete examination. Limited examinations for reoccurring indications may be performed as noted.  +-----------------+-------------+----------+----------------+ Right Cephalic   Diameter (cm)Depth (cm)    Findings     +-----------------+-------------+----------+----------------+ Prox upper arm  0.23        0.17                    +-----------------+-------------+----------+----------------+ Mid upper arm        0.21        0.14      branching     +-----------------+-------------+----------+----------------+ Dist upper arm       0.23        0.12      branching     +-----------------+-------------+----------+----------------+ Antecubital fossa    0.18        0.14      branching     +-----------------+-------------+----------+----------------+ Prox forearm         0.16        0.11      branching     +-----------------+-------------+----------+----------------+ Mid forearm          0.14        0.13                    +-----------------+-------------+----------+----------------+ Wrist                1.51        0.10   branching and IV +-----------------+-------------+----------+----------------+ +-----------------+-------------+----------+---------+ Right Basilic    Diameter (cm)Depth (cm)Findings  +-----------------+-------------+----------+---------+ Mid upper arm        0.48        0.61    origin   +-----------------+-------------+----------+---------+ Dist upper arm       0.26        0.61             +-----------------+-------------+----------+---------+ Antecubital fossa    0.25        0.30             +-----------------+-------------+----------+---------+ Prox forearm         0.18        0.25             +-----------------+-------------+----------+---------+ Mid forearm          0.23        0.26   branching +-----------------+-------------+----------+---------+ Distal forearm       0.22        0.33             +-----------------+-------------+----------+---------+ Wrist                0.10        0.16              +-----------------+-------------+----------+---------+ *See table(s) above for measurements and observations.  Diagnosing physician:    Preliminary    . sodium chloride   Intravenous Once  . calcitRIOL  2 mcg Oral Q T,Th,Sa-HD  . calcium acetate  1,334 mg Oral With snacks  . calcium acetate  2,668 mg Oral TID WC  . carvedilol  12.5 mg Oral Daily  . carvedilol  12.5 mg Oral Once per day on Sun Mon Wed Fri  . Chlorhexidine Gluconate Cloth  6 each Topical Q0600  . cinacalcet  30 mg Oral Q breakfast  . [START ON 07/30/2020] darbepoetin (ARANESP) injection - DIALYSIS  60 mcg Intravenous Q Tue-HD  . heparin  5,000 Units Subcutaneous Q8H  . hydrOXYzine  10 mg Oral Once  . [START ON 07/30/2020] loperamide  4 mg Oral Once per day on Tue Thu Sat  . multivitamin  1 tablet Oral QHS    BMET    Component Value Date/Time   NA 137  07/29/2020 0342   K 4.8 07/29/2020 0342   CL 97 (L) 07/29/2020 0342   CO2 27 07/29/2020 0342   GLUCOSE 133 (H) 07/29/2020 0342   BUN 77 (H) 07/29/2020 0342   CREATININE 11.66 (H) 07/29/2020 0342   CALCIUM 9.1 07/29/2020 0342   CALCIUM 6.7 (L) 01/31/2014 1309   GFRNONAA 4 (L) 07/29/2020 0342   GFRAA 4 (L) 07/29/2020 0342   CBC    Component Value Date/Time   WBC 3.7 (L) 07/29/2020 0342   RBC 3.22 (L) 07/29/2020 0342   HGB 8.7 (L) 07/29/2020 0342   HCT 28.6 (L) 07/29/2020 0342   PLT 242 07/29/2020 0342   MCV 88.8 07/29/2020 0342   MCH 27.0 07/29/2020 0342   MCHC 30.4 07/29/2020 0342   RDW 19.2 (H) 07/29/2020 0342   LYMPHSABS 0.8 07/29/2020 0342   MONOABS 0.7 07/29/2020 0342   EOSABS 0.0 07/29/2020 0342   BASOSABS 0.0 07/29/2020 0342   Patient dialyzes at [facility] TTS. Outpatient orders: F180, 3 hrs 60min. 2K, 2Ca, 137Na, 35Bicarb. BFR 400 via LIJ TDC. EDW 47kg. Calcitriol 31mcg qtreatment. Mircera 14mcg q2weeks  Assessment/Plan:  1. Vascular access- s/p ligation of LUE AVF.  Using LIJ TDC.  Vein mapping for new access. 2. Hyperkalemia due to noncompliance  with HD. 3. ESRD- continue with HD on TTS schedule.  Stressed importance of compliance 4. 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  Donetta Potts, MD [employer] [phone]

## 2020-07-30 DIAGNOSIS — Y832 Surgical operation with anastomosis, bypass or graft as the cause of abnormal reaction of the patient, or of later complication, without mention of misadventure at the time of the procedure: Secondary | ICD-10-CM | POA: Diagnosis present

## 2020-07-30 DIAGNOSIS — I132 Hypertensive heart and chronic kidney disease with heart failure and with stage 5 chronic kidney disease, or end stage renal disease: Secondary | ICD-10-CM | POA: Diagnosis present

## 2020-07-30 DIAGNOSIS — M329 Systemic lupus erythematosus, unspecified: Secondary | ICD-10-CM | POA: Diagnosis present

## 2020-07-30 DIAGNOSIS — I4510 Unspecified right bundle-branch block: Secondary | ICD-10-CM | POA: Diagnosis present

## 2020-07-30 DIAGNOSIS — E876 Hypokalemia: Secondary | ICD-10-CM | POA: Diagnosis present

## 2020-07-30 DIAGNOSIS — E871 Hypo-osmolality and hyponatremia: Secondary | ICD-10-CM | POA: Diagnosis present

## 2020-07-30 DIAGNOSIS — Z992 Dependence on renal dialysis: Secondary | ICD-10-CM | POA: Diagnosis not present

## 2020-07-30 DIAGNOSIS — Z79899 Other long term (current) drug therapy: Secondary | ICD-10-CM | POA: Diagnosis not present

## 2020-07-30 DIAGNOSIS — N186 End stage renal disease: Secondary | ICD-10-CM | POA: Diagnosis present

## 2020-07-30 DIAGNOSIS — F17210 Nicotine dependence, cigarettes, uncomplicated: Secondary | ICD-10-CM | POA: Diagnosis present

## 2020-07-30 DIAGNOSIS — F1490 Cocaine use, unspecified, uncomplicated: Secondary | ICD-10-CM | POA: Diagnosis present

## 2020-07-30 DIAGNOSIS — T82838A Hemorrhage of vascular prosthetic devices, implants and grafts, initial encounter: Secondary | ICD-10-CM | POA: Diagnosis present

## 2020-07-30 DIAGNOSIS — Z888 Allergy status to other drugs, medicaments and biological substances status: Secondary | ICD-10-CM | POA: Diagnosis not present

## 2020-07-30 DIAGNOSIS — Z20822 Contact with and (suspected) exposure to covid-19: Secondary | ICD-10-CM | POA: Diagnosis present

## 2020-07-30 DIAGNOSIS — N2581 Secondary hyperparathyroidism of renal origin: Secondary | ICD-10-CM | POA: Diagnosis present

## 2020-07-30 DIAGNOSIS — I5042 Chronic combined systolic (congestive) and diastolic (congestive) heart failure: Secondary | ICD-10-CM | POA: Diagnosis present

## 2020-07-30 DIAGNOSIS — Z9115 Patient's noncompliance with renal dialysis: Secondary | ICD-10-CM | POA: Diagnosis not present

## 2020-07-30 DIAGNOSIS — N185 Chronic kidney disease, stage 5: Secondary | ICD-10-CM | POA: Diagnosis not present

## 2020-07-30 DIAGNOSIS — T8241XA Breakdown (mechanical) of vascular dialysis catheter, initial encounter: Secondary | ICD-10-CM | POA: Diagnosis present

## 2020-07-30 DIAGNOSIS — E875 Hyperkalemia: Secondary | ICD-10-CM | POA: Diagnosis present

## 2020-07-30 DIAGNOSIS — I440 Atrioventricular block, first degree: Secondary | ICD-10-CM | POA: Diagnosis present

## 2020-07-30 DIAGNOSIS — D631 Anemia in chronic kidney disease: Secondary | ICD-10-CM | POA: Diagnosis present

## 2020-07-30 DIAGNOSIS — F1290 Cannabis use, unspecified, uncomplicated: Secondary | ICD-10-CM | POA: Diagnosis present

## 2020-07-30 LAB — RENAL FUNCTION PANEL
Albumin: 2.1 g/dL — ABNORMAL LOW (ref 3.5–5.0)
Anion gap: 12 (ref 5–15)
BUN: 88 mg/dL — ABNORMAL HIGH (ref 6–20)
CO2: 24 mmol/L (ref 22–32)
Calcium: 9.4 mg/dL (ref 8.9–10.3)
Chloride: 95 mmol/L — ABNORMAL LOW (ref 98–111)
Creatinine, Ser: 12.82 mg/dL — ABNORMAL HIGH (ref 0.44–1.00)
GFR calc Af Amer: 4 mL/min — ABNORMAL LOW (ref >60)
GFR calc non Af Amer: 3 mL/min — ABNORMAL LOW (ref >60)
Glucose, Bld: 110 mg/dL — ABNORMAL HIGH (ref 70–99)
Phosphorus: 8.3 mg/dL — ABNORMAL HIGH (ref 2.5–4.6)
Potassium: 5 mmol/L (ref 3.5–5.1)
Sodium: 131 mmol/L — ABNORMAL LOW (ref 135–145)

## 2020-07-30 LAB — CBC
HCT: 28.2 % — ABNORMAL LOW (ref 36.0–46.0)
Hemoglobin: 8.4 g/dL — ABNORMAL LOW (ref 12.0–15.0)
MCH: 26.6 pg (ref 26.0–34.0)
MCHC: 29.8 g/dL — ABNORMAL LOW (ref 30.0–36.0)
MCV: 89.2 fL (ref 80.0–100.0)
Platelets: 260 10*3/uL (ref 150–400)
RBC: 3.16 MIL/uL — ABNORMAL LOW (ref 3.87–5.11)
RDW: 19 % — ABNORMAL HIGH (ref 11.5–15.5)
WBC: 7.6 10*3/uL (ref 4.0–10.5)
nRBC: 0 % (ref 0.0–0.2)

## 2020-07-30 MED ORDER — DARBEPOETIN ALFA 60 MCG/0.3ML IJ SOSY
PREFILLED_SYRINGE | INTRAMUSCULAR | Status: AC
  Administered 2020-07-30: 60 ug via INTRAVENOUS
  Filled 2020-07-30: qty 0.3

## 2020-07-30 MED ORDER — HEPARIN SODIUM (PORCINE) 1000 UNIT/ML DIALYSIS: 20.0000 [IU]/kg | INTRAMUSCULAR | Status: DC | PRN

## 2020-07-30 MED ORDER — HEPARIN SODIUM (PORCINE) 1000 UNIT/ML IJ SOLN
INTRAMUSCULAR | Status: AC
  Administered 2020-07-30: 3200 [IU]
  Filled 2020-07-30: qty 4

## 2020-07-30 MED ORDER — OXYCODONE-ACETAMINOPHEN 5-325 MG PO TABS
ORAL_TABLET | ORAL | Status: AC
  Filled 2020-07-30: qty 2

## 2020-07-30 MED ORDER — HEPARIN SODIUM (PORCINE) 1000 UNIT/ML IJ SOLN
INTRAMUSCULAR | Status: AC
  Administered 2020-07-30: 900 [IU] via INTRAVENOUS_CENTRAL
  Filled 2020-07-30: qty 1

## 2020-07-30 MED ORDER — CALCITRIOL 0.5 MCG PO CAPS
ORAL_CAPSULE | ORAL | Status: AC
  Filled 2020-07-30: qty 4

## 2020-07-30 NOTE — Procedures (Signed)
I was present at this dialysis session. I have reviewed the session itself and made appropriate changes.   Vital signs in last 24 hours:  Temp:  [97.5 F (36.4 C)-98.6 F (37 C)] 98.6 F (37 C) (08/03 0653) Pulse Rate:  [69-89] 78 (08/03 0800) Resp:  [15-28] 28 (08/03 0800) BP: (113-143)/(73-91) 124/80 (08/03 0800) SpO2:  [96 %-99 %] 99 % (08/03 0653) Weight:  [49.9 kg] 49.9 kg (08/03 0653) Weight change: 3 kg Filed Weights   07/28/20 0744 07/28/20 0954 07/30/20 0653  Weight: 46.9 kg 46.9 kg 49.9 kg    Recent Labs  Lab 07/30/20 0717  NA 131*  K 5.0  CL 95*  CO2 24  GLUCOSE 110*  BUN 88*  CREATININE 12.82*  CALCIUM 9.4  PHOS 8.3*    Recent Labs  Lab 07/28/20 0420 07/28/20 0440 07/28/20 1630 07/29/20 0342 07/30/20 0707  WBC 5.4   < > 3.9* 3.7* 7.6  NEUTROABS 3.3  --   --  2.1  --   HGB 10.3*   < > 9.1* 8.7* 8.4*  HCT 34.9*   < > 30.7* 28.6* 28.2*  MCV 89.0   < > 88.2 88.8 89.2  PLT 269   < > 222 242 260   < > = values in this interval not displayed.    Scheduled Meds: . sodium chloride   Intravenous Once  . calcitRIOL  2 mcg Oral Q T,Th,Sa-HD  . calcium acetate  1,334 mg Oral With snacks  . calcium acetate  2,668 mg Oral TID WC  . carvedilol  12.5 mg Oral Daily  . carvedilol  12.5 mg Oral Once per day on Sun Mon Wed Fri  . Chlorhexidine Gluconate Cloth  6 each Topical Q0600  . cinacalcet  30 mg Oral Q breakfast  . darbepoetin (ARANESP) injection - DIALYSIS  60 mcg Intravenous Q Tue-HD  . heparin  5,000 Units Subcutaneous Q8H  . hydrOXYzine  10 mg Oral Once  . loperamide  4 mg Oral Once per day on Tue Thu Sat  . multivitamin  1 tablet Oral QHS   Continuous Infusions: PRN Meds:.acetaminophen **OR** acetaminophen, heparin, hydrOXYzine, morphine injection, ondansetron **OR** ondansetron (ZOFRAN) IV, oxyCODONE-acetaminophen, polyethylene glycol    Patient dialyzes at [place]. Outpatient orders: F180, 3 hrs 24min. 2K, 2Ca, 137Na, 35Bicarb. BFR 400 via  LIJ TDC. EDW 47kg. Calcitriol 30mcg qtreatment. Mircera 169mcg q2weeks  Assessment/Plan:  1. Vascular access- s/p ligation of LUE AVF.  Using LIJ TDC.  Vein mapping for new access. 2. Hyperkalemia due to noncompliance with HD. 3. ESRD- continue with HD on TTS schedule.  Stressed importance of compliance 4. 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  Donetta Potts,  MD 07/30/2020, 8:09 AM

## 2020-07-30 NOTE — Progress Notes (Signed)
° °  VASCULAR SURGERY ASSESSMENT & PLAN:   END-STAGE RENAL DISEASE: The patient required ligation of her left arm fistula for severe bleeding episodes and a nonsalvageable fistula.  She is undergone vein mapping and arterial evaluation.  Based on her vein map she is likely not a candidate for an AV fistula.  She would like to have her new access placed prior to discharge if at all possible.  I have scheduled her for a right AV fistula or AV graft on Thursday assuming she is still in the hospital.  We have discussed the procedure and potential complications and she is agreeable to proceed.  RENAL: She is scheduled for surgery on Thursday.  Please try to arrange her dialysis schedule around this.  Thank you.  Deitra Mayo, MD Office: [phone]   SUBJECTIVE:   No specific complaints.  PHYSICAL EXAM:   Vitals:   07/30/20 0900 07/30/20 0930 07/30/20 1030 07/30/20 1038  BP: 123/80 93/64 130/80 124/81  Pulse: 77 64 72 79  Resp: 12  (!) 24 19  Temp:    98.8 F (37.1 C)  TempSrc:    Oral  SpO2:    93%  Weight:      Height:       She has palpable radial pulses. Her dressing on the left arm is dry.  I did not change this because she is on dialysis.  LABS:   Lab Results  Component Value Date   WBC 7.6 07/30/2020   HGB 8.4 (L) 07/30/2020   HCT 28.2 (L) 07/30/2020   MCV 89.2 07/30/2020   PLT 260 07/30/2020   Lab Results  Component Value Date   CREATININE 12.82 (H) 07/30/2020   Lab Results  Component Value Date   INR 1.14 06/30/2018   CBG (last 3)  Recent Labs    07/28/20 0516 07/28/20 1151 07/28/20 1301  GLUCAP 113* 79 77    PROBLEM LIST:    Principal Problem:   Acute hyperkalemia Active Problems:   SLE (systemic lupus erythematosus) (HCC)   Chronic combined systolic (congestive) and diastolic (congestive) heart failure (HCC)   ESRD (end stage renal disease) on dialysis (HCC)   Nicotine dependence, cigarettes, uncomplicated   Essential hypertension    Polysubstance abuse ([place])   Hemodialysis catheter malfunction (HCC)   Complication of AV dialysis fistula, sequela   CURRENT MEDS:    sodium chloride   Intravenous Once   calcitRIOL  2 mcg Oral Q T,Th,Sa-HD   calcium acetate  1,334 mg Oral With snacks   calcium acetate  2,668 mg Oral TID WC   carvedilol  12.5 mg Oral Daily   carvedilol  12.5 mg Oral Once per day on Sun Mon Wed Fri   Chlorhexidine Gluconate Cloth  6 each Topical Q0600   cinacalcet  30 mg Oral Q breakfast   darbepoetin (ARANESP) injection - DIALYSIS  60 mcg Intravenous Q Tue-HD   heparin  5,000 Units Subcutaneous Q8H   hydrOXYzine  10 mg Oral Once   loperamide  4 mg Oral Once per day on Tue Thu Sat   multivitamin  1 tablet Oral QHS    Deitra Mayo Office: [phone] 07/30/2020

## 2020-07-30 NOTE — Progress Notes (Signed)
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  Admitted early this AM with severe hyperkalemia after missing sessions of hemodialysis.  Urgently hemodialysis for K+ 7.4.  Bleeding from fistula needing intervention.  Vascular surgery taken for oversewing of left arm brachial cephalic fistula on 04/29/28 unable to save fistula.  07/30/20: HD today.  Seen by vascular surgery.  Post ligation of left brachial cephalic AV fistula.  Plan for R AV fistula or AV graft on Thursday by vascular surgery.   Assessment/Plan: Principal Problem:   Acute hyperkalemia Active Problems:   SLE (systemic lupus erythematosus) (HCC)   Chronic combined systolic (congestive) and diastolic (congestive) heart failure (HCC)   ESRD (end stage renal disease) on dialysis (HCC)   Nicotine dependence, cigarettes, uncomplicated   Essential hypertension   Polysubstance abuse ([place])   Hemodialysis catheter malfunction ([place])   Complication of AV dialysis fistula, sequela  Resolved Severe hyperkalemia secondary to missing 2 hemodialysis sessions in the setting of ESRD HD TTS Urgently taken for dialysis on 07/28/20 Hyperkalemia has resolved post hemodialysis Bleeding from fistula needing intervention.  Vascular surgery taken for oversewing of left arm brachial cephalic fistula on 08/29/41 unable to save fistula, ligated. Completed venous mapping by vascular surgery with plan for R AV fistula or AV graft on  Thursday by vascular surgery.  ESRD on HD TTS Noncompliance Nephrology following Vascular surgery following for hemodialysis access. Post oversewing of left AV fistula on 07/28/2020, unable to save. Post vein mapping today by vascular surgery on 07/29/20.  Chronic combined systolic and diastolic CHF Last 2D echo done on 02/17/2017 showed LVEF 40 to 45% and grade 2 diastolic dysfunction Volume status addressed with hemodialysis Currently on Coreg  SLE Not currently on immunomodulating therapy  Hx of polysubstance abuse including tobacco, cocaine and marijuana  No UDS done on admission Counseling at bedside     Code Status: Full code  Family Communication: None at bedside   Consultants:  Nephrology  Vascular surgery  Procedures:  Oversewing of left AV fistula  Antimicrobials:  None  DVT prophylaxis: Subcu heparin 3 times daily  Status is: Observation    Dispo:  Patient From: Home  Planned Disposition: Home  Expected discharge date: 08/02/20  Medically stable for discharge: No, ongoing management for hemodialysis access.    Objective: Vitals:   07/30/20 0930 07/30/20 1030 07/30/20 1038 07/30/20 1100  BP: 93/64 130/80 124/81 126/90  Pulse: 64 72 79 81  Resp:  (!) 24 19 15   Temp:   98.8 F (37.1 C) 98.5 F (36.9 C)  TempSrc:   Oral Oral  SpO2:   93%   Weight:      Height:        Intake/Output Summary (Last 24 hours) at 07/30/2020 1147 Last data filed at 07/30/2020 1038 Gross per 24 hour  Intake 120 ml  Output 2000 ml  Net -1880 ml   Filed Weights   07/28/20 0744 07/28/20 0954 07/30/20 0653  Weight: 46.9 kg 46.9 kg 49.9 kg    Exam:  . General: 39 y.o. year-old female well-developed  well-nourished no acute distress.  Alert oriented x3.   . Cardiovascular: Regular rate and rhythm no rubs or gallops. Marland Kitchen Respiratory: Clear to auscultation no wheezes or rales.   . Abdomen: Soft nontender normal bowel sounds present.   . Musculoskeletal: No lower  extremity edema bilaterally.   Marland Kitchen Psychiatry: Mood is appropriate for condition and setting.  Data Reviewed: CBC: Recent Labs  Lab 07/25/20 0248 07/25/20 0248 07/28/20 0420 07/28/20 0440 07/28/20 1022 07/28/20 1055 07/28/20 1630 07/29/20 0342 07/30/20 0707  WBC 5.0  --  5.4  --   --   --  3.9* 3.7* 7.6  NEUTROABS  --   --  3.3  --   --   --   --  2.1  --   HGB 10.2*   < > 10.3*   < > 11.2* 9.9* 9.1* 8.7* 8.4*  HCT 34.5*   < > 34.9*   < > 33.0* 29.0* 30.7* 28.6* 28.2*  MCV 89.4  --  89.0  --   --   --  88.2 88.8 89.2  PLT 189  --  269  --   --   --  222 242 260   < > = values in this interval not displayed.   Basic Metabolic Panel: Recent Labs  Lab 07/25/20 0248 07/25/20 0248 07/28/20 0420 07/28/20 0440 07/28/20 1022 07/28/20 1055 07/28/20 1630 07/29/20 0342 07/30/20 0717  NA 135   < > 135   < > 137 138 136 137 131*  K 4.6   < > >7.5*   < > 3.4* 2.9* 4.5 4.8 5.0  CL 94*   < > 91*   < > 94* 97* 97* 97* 95*  CO2 21*  --  19*  --   --   --  21* 27 24  GLUCOSE 83   < > 91   < > 333* 129* 158* 133* 110*  BUN 89*   < > 137*   < > 55* 54* 62* 77* 88*  CREATININE 12.28*   < > 18.31*   < > 9.60* 10.00* 10.48* 11.66* 12.82*  CALCIUM 9.2  --  9.2  --   --   --  8.8* 9.1 9.4  MG  --   --   --   --   --   --   --  2.4  --   PHOS  --   --   --   --   --   --   --   --  8.3*   < > = values in this interval not displayed.   GFR: Estimated Creatinine Clearance: 4.8 mL/min (A) (by C-G formula based on SCr of 12.82 mg/dL (H)). Liver Function Tests: Recent Labs  Lab 07/29/20 0342 07/30/20 0717  AST 28  --   ALT 12  --   ALKPHOS 146*  --   BILITOT 0.4  --   PROT 7.0  --   ALBUMIN 2.1* 2.1*   No results for input(s): LIPASE, AMYLASE in the last 168 hours. No results for input(s): AMMONIA in the last 168 hours. Coagulation Profile: No results for input(s): INR, PROTIME in the last 168 hours. Cardiac Enzymes: No results for input(s): CKTOTAL, CKMB, CKMBINDEX, TROPONINI in the  last 168 hours. BNP (last 3 results) No results for input(s): PROBNP in the last 8760 hours. HbA1C: No results for input(s): HGBA1C in the last 72 hours. CBG: Recent Labs  Lab 07/28/20 0516 07/28/20 1151 07/28/20 1301  GLUCAP 113*  79 77   Lipid Profile: No results for input(s): CHOL, HDL, LDLCALC, TRIG, CHOLHDL, LDLDIRECT in the last 72 hours. Thyroid Function Tests: No results for input(s): TSH, T4TOTAL, FREET4, T3FREE, THYROIDAB in the last 72 hours. Anemia Panel: No results for input(s): VITAMINB12, FOLATE, FERRITIN, TIBC, IRON, RETICCTPCT in the last 72 hours. Urine analysis:    Component Value Date/Time   COLORURINE YELLOW 09/25/2015 1412   APPEARANCEUR CLOUDY (A) 09/25/2015 1412   LABSPEC 1.018 09/25/2015 1412   PHURINE 8.0 09/25/2015 1412   GLUCOSEU NEGATIVE 09/25/2015 1412   HGBUR LARGE (A) 09/25/2015 1412   BILIRUBINUR NEGATIVE 09/25/2015 1412   KETONESUR NEGATIVE 09/25/2015 1412   PROTEINUR >300 (A) 09/25/2015 1412   UROBILINOGEN 0.2 09/25/2015 1412   NITRITE NEGATIVE 09/25/2015 1412   LEUKOCYTESUR MODERATE (A) 09/25/2015 1412   Sepsis Labs: @LABRCNTIP (procalcitonin:4,lacticidven:4)  ) Recent Results (from the past 240 hour(s))  SARS Coronavirus 2 by RT PCR (hospital order, performed in [place] hospital lab) Nasopharyngeal Nasopharyngeal Swab     Status: None   Collection Time: 07/28/20  4:43 AM   Specimen: Nasopharyngeal Swab  Result Value Ref Range Status   SARS Coronavirus 2 NEGATIVE NEGATIVE Final    Comment: (NOTE) SARS-CoV-2 target nucleic acids are NOT DETECTED.  The SARS-CoV-2 RNA is generally detectable in upper and lower respiratory specimens during the acute phase of infection. The lowest concentration of SARS-CoV-2 viral copies this assay can detect is 250 copies / mL. A negative result does not preclude SARS-CoV-2 infection and should not be used as the sole basis for treatment or other patient management decisions.  A negative result  may occur with improper specimen collection / handling, submission of specimen other than nasopharyngeal swab, presence of viral mutation(s) within the areas targeted by this assay, and inadequate number of viral copies (<250 copies / mL). A negative result must be combined with clinical observations, patient history, and epidemiological information.  Fact Sheet for Patients:   StrictlyIdeas.no  Fact Sheet for Healthcare Providers: BankingDealers.co.za  This test is not yet approved or  cleared by the [place] FDA and has been authorized for detection and/or diagnosis of SARS-CoV-2 by FDA under an Emergency Use Authorization (EUA).  This EUA will remain in effect (meaning this test can be used) for the duration of the COVID-19 declaration under Section 564(b)(1) of the Act, 21 U.S.C. section 360bbb-3(b)(1), unless the authorization is terminated or revoked sooner.  Performed at [facility], [place] [address] [place], [place] [address]   Blood culture (routine x 2)     Status: None (Preliminary result)   Collection Time: 07/28/20  6:50 AM   Specimen: BLOOD LEFT WRIST  Result Value Ref Range Status   Specimen Description BLOOD LEFT WRIST  Final   Special Requests   Final    BOTTLES DRAWN AEROBIC AND ANAEROBIC Blood Culture results may not be optimal due to an inadequate volume of blood received in culture bottles   Culture   Final    NO GROWTH 1 DAY Performed at [facility], [place] [address] [place], [place] [address]    Report Status PENDING  Incomplete  Blood culture (routine x 2)     Status: None (Preliminary result)   Collection Time: 07/28/20  6:57 AM   Specimen: BLOOD LEFT HAND  Result Value Ref Range Status   Specimen Description BLOOD LEFT HAND  Final   Special Requests   Final    BOTTLES DRAWN AEROBIC AND ANAEROBIC Blood  Culture results may not be optimal due to an inadequate volume of blood received in  culture bottles   Culture   Final    NO GROWTH 1 DAY Performed at [facility], [place] [address], [place], [place] [address]    Report Status PENDING  Incomplete      Studies: No results found.  Scheduled Meds: . sodium chloride   Intravenous Once  . calcitRIOL  2 mcg Oral Q T,Th,Sa-HD  . calcium acetate  1,334 mg Oral With snacks  . calcium acetate  2,668 mg Oral TID WC  . carvedilol  12.5 mg Oral Daily  . carvedilol  12.5 mg Oral Once per day on Sun Mon Wed Fri  . Chlorhexidine Gluconate Cloth  6 each Topical Q0600  . cinacalcet  30 mg Oral Q breakfast  . darbepoetin (ARANESP) injection - DIALYSIS  60 mcg Intravenous Q Tue-HD  . heparin  5,000 Units Subcutaneous Q8H  . hydrOXYzine  10 mg Oral Once  . loperamide  4 mg Oral Once per day on Tue Thu Sat  . multivitamin  1 tablet Oral QHS    Continuous Infusions:   LOS: 0 days     Kayleen Memos, MD [facility] Pager [phone]  If 7PM-7AM, please contact night-coverage [email or web address] Password [email or web address] 07/30/2020, 11:47 AM

## 2020-07-31 ENCOUNTER — Inpatient Hospital Stay (HOSPITAL_COMMUNITY): Payer: Medicaid Other

## 2020-07-31 LAB — CBC WITH DIFFERENTIAL/PLATELET
Abs Immature Granulocytes: 0.05 10*3/uL (ref 0.00–0.07)
Basophils Absolute: 0 10*3/uL (ref 0.0–0.1)
Basophils Relative: 0 %
Eosinophils Absolute: 0.1 10*3/uL (ref 0.0–0.5)
Eosinophils Relative: 1 %
HCT: 28.9 % — ABNORMAL LOW (ref 36.0–46.0)
Hemoglobin: 8.9 g/dL — ABNORMAL LOW (ref 12.0–15.0)
Immature Granulocytes: 1 %
Lymphocytes Relative: 11 %
Lymphs Abs: 0.6 10*3/uL — ABNORMAL LOW (ref 0.7–4.0)
MCH: 27.1 pg (ref 26.0–34.0)
MCHC: 30.8 g/dL (ref 30.0–36.0)
MCV: 87.8 fL (ref 80.0–100.0)
Monocytes Absolute: 0.9 10*3/uL (ref 0.1–1.0)
Monocytes Relative: 16 %
Neutro Abs: 4 10*3/uL (ref 1.7–7.7)
Neutrophils Relative %: 71 %
Platelets: 244 10*3/uL (ref 150–400)
RBC: 3.29 MIL/uL — ABNORMAL LOW (ref 3.87–5.11)
RDW: 18.9 % — ABNORMAL HIGH (ref 11.5–15.5)
WBC: 5.6 10*3/uL (ref 4.0–10.5)
nRBC: 0.5 % — ABNORMAL HIGH (ref 0.0–0.2)

## 2020-07-31 LAB — COMPREHENSIVE METABOLIC PANEL
ALT: 6 U/L (ref 0–44)
AST: 20 U/L (ref 15–41)
Albumin: 2 g/dL — ABNORMAL LOW (ref 3.5–5.0)
Alkaline Phosphatase: 114 U/L (ref 38–126)
Anion gap: 8 (ref 5–15)
BUN: 31 mg/dL — ABNORMAL HIGH (ref 6–20)
CO2: 26 mmol/L (ref 22–32)
Calcium: 9 mg/dL (ref 8.9–10.3)
Chloride: 98 mmol/L (ref 98–111)
Creatinine, Ser: 7.56 mg/dL — ABNORMAL HIGH (ref 0.44–1.00)
GFR calc Af Amer: 7 mL/min — ABNORMAL LOW (ref >60)
GFR calc non Af Amer: 6 mL/min — ABNORMAL LOW (ref >60)
Glucose, Bld: 84 mg/dL (ref 70–99)
Potassium: 4.4 mmol/L (ref 3.5–5.1)
Sodium: 132 mmol/L — ABNORMAL LOW (ref 135–145)
Total Bilirubin: 0.6 mg/dL (ref 0.3–1.2)
Total Protein: 6.9 g/dL (ref 6.5–8.1)

## 2020-07-31 LAB — PROCALCITONIN: Procalcitonin: 4.37 ng/mL

## 2020-07-31 LAB — LACTIC ACID, PLASMA: Lactic Acid, Venous: 0.7 mmol/L (ref 0.5–1.9)

## 2020-07-31 MED ORDER — CEFAZOLIN SODIUM-DEXTROSE 1-4 GM/50ML-% IV SOLN
1.0000 g | INTRAVENOUS | Status: AC
  Administered 2020-08-01: 1 g via INTRAVENOUS

## 2020-07-31 NOTE — Progress Notes (Addendum)
  Progress Note  ADDENDUM: We will try to proceed with placement of access in the left arm today.  She has been n.p.o. and added on to today's schedule.  Deitra Mayo, MD Office: [phone]   07/31/2020 7:31 AM 3 Days Post-Op  Subjective:  Soreness L arm   Vitals:   07/30/20 2338 07/31/20 0510  BP: 134/79 136/88  Pulse: 73 91  Resp: 15 19  Temp: 99.6 F (37.6 C) 100.2 F (37.9 C)  SpO2: 94% 92%   Physical Exam: Lungs:  Non labored Incisions:  L arm incisions healing well; no drainage Extremities:  Palpable L radial pulse Neurologic: A&O  CBC    Component Value Date/Time   WBC 7.6 07/30/2020 0707   RBC 3.16 (L) 07/30/2020 0707   HGB 8.4 (L) 07/30/2020 0707   HCT 28.2 (L) 07/30/2020 0707   PLT 260 07/30/2020 0707   MCV 89.2 07/30/2020 0707   MCH 26.6 07/30/2020 0707   MCHC 29.8 (L) 07/30/2020 0707   RDW 19.0 (H) 07/30/2020 0707   LYMPHSABS 0.8 07/29/2020 0342   MONOABS 0.7 07/29/2020 0342   EOSABS 0.0 07/29/2020 0342   BASOSABS 0.0 07/29/2020 0342    BMET    Component Value Date/Time   NA 131 (L) 07/30/2020 0717   K 5.0 07/30/2020 0717   CL 95 (L) 07/30/2020 0717   CO2 24 07/30/2020 0717   GLUCOSE 110 (H) 07/30/2020 0717   BUN 88 (H) 07/30/2020 0717   CREATININE 12.82 (H) 07/30/2020 0717   CALCIUM 9.4 07/30/2020 0717   CALCIUM 6.7 (L) 01/31/2014 1309   GFRNONAA 3 (L) 07/30/2020 0717   GFRAA 4 (L) 07/30/2020 0717    INR    Component Value Date/Time   INR 1.14 06/30/2018 1011     Intake/Output Summary (Last 24 hours) at 07/31/2020 0731 Last data filed at 07/30/2020 1038 Gross per 24 hour  Intake --  Output 2000 ml  Net -2000 ml     Assessment/Plan:  39 y.o. female is s/p ligation of L arm AVF 3 Days Post-Op   L hand well perfused with palpable L radial pulse Incisions of L arm healing well without hematoma or drainage Plan is for R arm AVF vs AVG tomorrow Pre-op orders written Please do not take patient to dialysis until after surgery  tomorrow 08/01/20    Dagoberto Ligas, PA-C [facility] [phone] 07/31/2020 7:31 AM

## 2020-07-31 NOTE — Progress Notes (Signed)
Patient ID: [name], female   DOB: [birth year], 39 y.o.   MRN: [id number] S: No new complaints. Feels better O:BP 126/88 (BP Location: Right Arm)   Pulse 81   Temp 98.4 F (36.9 C) (Oral)   Resp 19   Ht 5\' 5"  (1.651 m)   Wt 49.9 kg   SpO2 99%   BMI 18.31 kg/m   Intake/Output Summary (Last 24 hours) at 07/31/2020 1029 Last data filed at 07/31/2020 0848 Gross per 24 hour  Intake 120 ml  Output 2000 ml  Net -1880 ml   Intake/Output: I/O last 3 completed shifts: In: -  Out: 2000 [Other:2000]  Intake/Output this shift:  Total I/O In: 120 [P.O.:120] Out: -  Weight change:  Gen: NAD CVS: no rub Resp: cta Abd: benign Ext: no edema  Recent Labs  Lab 07/25/20 0248 07/25/20 0248 07/28/20 0420 07/28/20 0420 07/28/20 0440 07/28/20 1022 07/28/20 1055 07/28/20 1630 07/29/20 0342 07/30/20 0717 07/31/20 0826  NA 135   < > 135   < > 132* 137 138 136 137 131* 132*  K 4.6   < > >7.5*   < > 7.4* 3.4* 2.9* 4.5 4.8 5.0 4.4  CL 94*   < > 91*   < > 96* 94* 97* 97* 97* 95* 98  CO2 21*  --  19*  --   --   --   --  21* 27 24 26   GLUCOSE 83   < > 91   < > 86 333* 129* 158* 133* 110* 84  BUN 89*   < > 137*   < > >130* 55* 54* 62* 77* 88* 31*  CREATININE 12.28*   < > 18.31*   < > >18.00* 9.60* 10.00* 10.48* 11.66* 12.82* 7.56*  ALBUMIN  --   --   --   --   --   --   --   --  2.1* 2.1* 2.0*  CALCIUM 9.2  --  9.2  --   --   --   --  8.8* 9.1 9.4 9.0  PHOS  --   --   --   --   --   --   --   --   --  8.3*  --   AST  --   --   --   --   --   --   --   --  28  --  20  ALT  --   --   --   --   --   --   --   --  12  --  6   < > = values in this interval not displayed.   Liver Function Tests: Recent Labs  Lab 07/29/20 0342 07/30/20 0717 07/31/20 0826  AST 28  --  20  ALT 12  --  6  ALKPHOS 146*  --  114  BILITOT 0.4  --  0.6  PROT 7.0  --  6.9  ALBUMIN 2.1* 2.1* 2.0*   No results for input(s): LIPASE, AMYLASE in the last 168 hours. No results for input(s): AMMONIA in the last  168 hours. CBC: Recent Labs  Lab 07/28/20 0420 07/28/20 0440 07/28/20 1630 07/28/20 1630 07/29/20 0342 07/30/20 0707 07/31/20 0914  WBC 5.4   < > 3.9*   < > 3.7* 7.6 5.6  NEUTROABS 3.3  --   --   --  2.1  --  4.0  HGB 10.3*   < > 9.1*   < >  8.7* 8.4* 8.9*  HCT 34.9*   < > 30.7*   < > 28.6* 28.2* 28.9*  MCV 89.0  --  88.2  --  88.8 89.2 87.8  PLT 269   < > 222   < > 242 260 244   < > = values in this interval not displayed.   Cardiac Enzymes: No results for input(s): CKTOTAL, CKMB, CKMBINDEX, TROPONINI in the last 168 hours. CBG: Recent Labs  Lab 07/28/20 0516 07/28/20 1151 07/28/20 1301  GLUCAP 113* 79 77    Iron Studies: No results for input(s): IRON, TIBC, TRANSFERRIN, FERRITIN in the last 72 hours. Studies/Results: DG CHEST PORT 1 VIEW  Result Date: 07/31/2020 CLINICAL DATA:  Fever, shortness of breath. EXAM: PORTABLE CHEST 1 VIEW COMPARISON:  July 28, 2020. FINDINGS: Stable cardiomegaly. Left-sided dialysis catheter is unchanged in position. No pneumothorax is noted. Stable left basilar subsegmental atelectasis is noted with probable small left pleural effusion. Stable mild reticular and interstitial densities are noted throughout both lungs which may represent chronic scarring, but acute superimposed edema or inflammation cannot be excluded. Bony thorax is unremarkable. IMPRESSION: Stable mild reticular and interstitial densities are noted throughout both lungs which may represent chronic scarring, but acute superimposed edema or inflammation cannot be excluded. Electronically Signed   By: Marijo Conception M.D.   On: 07/31/2020 08:35   VAS [name] UPPER EXTREMITY ARTERIAL DUPLEX  Result Date: 07/30/2020 UPPER EXTREMITY DUPLEX STUDY Indications: Patient complains of Pre Dialysis access.  Risk Factors:  Hypertension. Other Factors: Lupus, CHF, polysubstance abuse, non compliant with dialysis,                failed left dialysis access Limitations: IV at wrist Comparison Study: No  prior study on file Performing Technologist: Sharion Dove RVS  Examination Guidelines: A complete evaluation includes B-mode imaging, spectral Doppler, color Doppler, and power Doppler as needed of all accessible portions of each vessel. Bilateral testing is considered an integral part of a complete examination. Limited examinations for reoccurring indications may be performed as noted.  Right Doppler Findings: +---------------+----------+---------+--------+--------+ Site           PSV (cm/s)Waveform StenosisComments +---------------+----------+---------+--------+--------+ Subclavian Dist                                    +---------------+----------+---------+--------+--------+ Brachial Dist  68        triphasic                 +---------------+----------+---------+--------+--------+ Radial Dist    90        triphasic                 +---------------+----------+---------+--------+--------+ Ulnar Dist     72        triphasic                 +---------------+----------+---------+--------+--------+  Right Pre-Dialysis Findings: +-----------------------+----------+--------------------+---------+--------+ Location               PSV (cm/s)Intralum. Diam. (cm)Waveform Comments +-----------------------+----------+--------------------+---------+--------+ Brachial Antecub. fossa68        0.46                triphasic         +-----------------------+----------+--------------------+---------+--------+ Radial Art at Wrist    90        0.24                triphasic         +-----------------------+----------+--------------------+---------+--------+  Ulnar Art at Wrist     72        0.17                triphasic         +-----------------------+----------+--------------------+---------+--------+  Electronically signed by Deitra Mayo MD on 07/30/2020 at 8:11:40 PM.    Final    VAS [name] UPPER EXT VEIN MAPPING (PRE-OP AVF)  Result Date: 07/29/2020 UPPER EXTREMITY VEIN  MAPPING  Indications: Pre-access. History: Lupus, CHF, Hypertension, polysubstance abuse, non compliant with          dialysis, failed left dialysis access.  Limitations: IV at right wrist. Comparison Study: No prior study on file Performing Technologist: Sharion Dove RVS  Examination Guidelines: A complete evaluation includes B-mode imaging, spectral Doppler, color Doppler, and power Doppler as needed of all accessible portions of each vessel. Bilateral testing is considered an integral part of a complete examination. Limited examinations for reoccurring indications may be performed as noted. +-----------------+-------------+----------+----------------+ Right Cephalic   Diameter (cm)Depth (cm)    Findings     +-----------------+-------------+----------+----------------+ Prox upper arm       0.23        0.17                    +-----------------+-------------+----------+----------------+ Mid upper arm        0.21        0.14      branching     +-----------------+-------------+----------+----------------+ Dist upper arm       0.23        0.12      branching     +-----------------+-------------+----------+----------------+ Antecubital fossa    0.18        0.14      branching     +-----------------+-------------+----------+----------------+ Prox forearm         0.16        0.11      branching     +-----------------+-------------+----------+----------------+ Mid forearm          0.14        0.13                    +-----------------+-------------+----------+----------------+ Wrist                1.51        0.10   branching and IV +-----------------+-------------+----------+----------------+ +-----------------+-------------+----------+---------+ Right Basilic    Diameter (cm)Depth (cm)Findings  +-----------------+-------------+----------+---------+ Mid upper arm        0.48        0.61    origin   +-----------------+-------------+----------+---------+ Dist upper arm        0.26        0.61             +-----------------+-------------+----------+---------+ Antecubital fossa    0.25        0.30             +-----------------+-------------+----------+---------+ Prox forearm         0.18        0.25             +-----------------+-------------+----------+---------+ Mid forearm          0.23        0.26   branching +-----------------+-------------+----------+---------+ Distal forearm       0.22        0.33             +-----------------+-------------+----------+---------+ Wrist  0.10        0.16             +-----------------+-------------+----------+---------+ *See table(s) above for measurements and observations.  Diagnosing physician: Ruta Hinds MD Electronically signed by Ruta Hinds MD on 07/29/2020 at 3:50:03 PM.    Final    . sodium chloride   Intravenous Once  . calcitRIOL  2 mcg Oral Q T,Th,Sa-HD  . calcium acetate  1,334 mg Oral With snacks  . calcium acetate  2,668 mg Oral TID WC  . carvedilol  12.5 mg Oral Daily  . carvedilol  12.5 mg Oral Once per day on Sun Mon Wed Fri  . Chlorhexidine Gluconate Cloth  6 each Topical Q0600  . cinacalcet  30 mg Oral Q breakfast  . darbepoetin (ARANESP) injection - DIALYSIS  60 mcg Intravenous Q Tue-HD  . heparin  5,000 Units Subcutaneous Q8H  . hydrOXYzine  10 mg Oral Once  . loperamide  4 mg Oral Once per day on Tue Thu Sat  . multivitamin  1 tablet Oral QHS    BMET    Component Value Date/Time   NA 132 (L) 07/31/2020 0826   K 4.4 07/31/2020 0826   CL 98 07/31/2020 0826   CO2 26 07/31/2020 0826   GLUCOSE 84 07/31/2020 0826   BUN 31 (H) 07/31/2020 0826   CREATININE 7.56 (H) 07/31/2020 0826   CALCIUM 9.0 07/31/2020 0826   CALCIUM 6.7 (L) 01/31/2014 1309   GFRNONAA 6 (L) 07/31/2020 0826   GFRAA 7 (L) 07/31/2020 0826   CBC    Component Value Date/Time   WBC 5.6 07/31/2020 0914   RBC 3.29 (L) 07/31/2020 0914   HGB 8.9 (L) 07/31/2020 0914   HCT 28.9 (L)  07/31/2020 0914   PLT 244 07/31/2020 0914   MCV 87.8 07/31/2020 0914   MCH 27.1 07/31/2020 0914   MCHC 30.8 07/31/2020 0914   RDW 18.9 (H) 07/31/2020 0914   LYMPHSABS 0.6 (L) 07/31/2020 0914   MONOABS 0.9 07/31/2020 0914   EOSABS 0.1 07/31/2020 0914   BASOSABS 0.0 07/31/2020 0914    Patient dialyzes at [facility] TTS. Outpatient orders: F180, 3 hrs 93min. 2K, 2Ca, 137Na, 35Bicarb. BFR 400 via LIJ TDC. EDW 47kg. Calcitriol 22mcg qtreatment. Mircera 141mcg q2weeks  Assessment/Plan:  1. Vascular access- s/p ligation of LUE AVF. Using LIJ TDC. Vein mapping for new access.  Surgery planned for tomorrow and will have HD afterwards. 2. Hyperkalemia due to noncompliance with HD. Resolved with HD. 3. ESRD- continue with HD on TTS schedule. Stressed importance of compliance 4. 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  Donetta Potts, MD [employer] [phone]

## 2020-07-31 NOTE — Progress Notes (Signed)
[facility] consulted to obtain IV access in Left arm for scheduled AVF surgery today (AVF to be placed in right arm). Pt recently had surgery to stop bleeding of left arm fistula (stitches currently in place). Spoke with pt's nurse, Orvil Feil. Learned surgery will be postponed until tomorrow d/t pt eating this am. Spoke with patient regarding IV access for tomorrow's surgery. Pt reported current IV in right arm is not working.  Nephrologist came into room while [facility] RN present; asked about access needed for tomorrow's surgery. Nephrologist stated pt will not need an IV as they can utilize pt's HD catheter during surgery. Weston Anna, RN .

## 2020-07-31 NOTE — H&P (View-Only) (Signed)
  Progress Note  ADDENDUM: We will try to proceed with placement of access in the left arm today.  She has been n.p.o. and added on to today's schedule.  Deitra Mayo, MD Office: [phone]   07/31/2020 7:31 AM 3 Days Post-Op  Subjective:  Soreness L arm   Vitals:   07/30/20 2338 07/31/20 0510  BP: 134/79 136/88  Pulse: 73 91  Resp: 15 19  Temp: 99.6 F (37.6 C) 100.2 F (37.9 C)  SpO2: 94% 92%   Physical Exam: Lungs:  Non labored Incisions:  L arm incisions healing well; no drainage Extremities:  Palpable L radial pulse Neurologic: A&O  CBC    Component Value Date/Time   WBC 7.6 07/30/2020 0707   RBC 3.16 (L) 07/30/2020 0707   HGB 8.4 (L) 07/30/2020 0707   HCT 28.2 (L) 07/30/2020 0707   PLT 260 07/30/2020 0707   MCV 89.2 07/30/2020 0707   MCH 26.6 07/30/2020 0707   MCHC 29.8 (L) 07/30/2020 0707   RDW 19.0 (H) 07/30/2020 0707   LYMPHSABS 0.8 07/29/2020 0342   MONOABS 0.7 07/29/2020 0342   EOSABS 0.0 07/29/2020 0342   BASOSABS 0.0 07/29/2020 0342    BMET    Component Value Date/Time   NA 131 (L) 07/30/2020 0717   K 5.0 07/30/2020 0717   CL 95 (L) 07/30/2020 0717   CO2 24 07/30/2020 0717   GLUCOSE 110 (H) 07/30/2020 0717   BUN 88 (H) 07/30/2020 0717   CREATININE 12.82 (H) 07/30/2020 0717   CALCIUM 9.4 07/30/2020 0717   CALCIUM 6.7 (L) 01/31/2014 1309   GFRNONAA 3 (L) 07/30/2020 0717   GFRAA 4 (L) 07/30/2020 0717    INR    Component Value Date/Time   INR 1.14 06/30/2018 1011     Intake/Output Summary (Last 24 hours) at 07/31/2020 0731 Last data filed at 07/30/2020 1038 Gross per 24 hour  Intake --  Output 2000 ml  Net -2000 ml     Assessment/Plan:  39 y.o. female is s/p ligation of L arm AVF 3 Days Post-Op   L hand well perfused with palpable L radial pulse Incisions of L arm healing well without hematoma or drainage Plan is for R arm AVF vs AVG tomorrow Pre-op orders written Please do not take patient to dialysis until after surgery  tomorrow 08/01/20    Dagoberto Ligas, PA-C [facility] [phone] 07/31/2020 7:31 AM

## 2020-07-31 NOTE — Progress Notes (Addendum)
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  Admitted early this AM with severe hyperkalemia after missing sessions of hemodialysis.  Urgently hemodialysis for K+ 7.4.  Bleeding from fistula needing intervention.  Vascular surgery taken for oversewing of left arm brachial cephalic fistula on 01/01/39 unable to save fistula, post ligation of left brachial cephalic AV fistula.  HD 8/3.  Plan for R AV fistula or AV graft on Thursday by vascular surgery.  07/31/20: Seen and examined.  Febrile overnight with T-max 101.5.  She is somnolent this morning but easily arousable to voices and answers questions appropriately.  She denies any cardiopulmonary or GI symptoms.  States she no longer makes urine.  Blood cultures obtained, follow cultures.  Vital signs are stable, no leukocytosis.  Will hold off on antibiotics for now.   Assessment/Plan: Principal Problem:   Acute hyperkalemia Active Problems:   SLE (systemic lupus erythematosus) (HCC)   Chronic combined systolic (congestive) and diastolic (congestive) heart failure (HCC)   ESRD (end stage renal disease) on dialysis (HCC)   Nicotine dependence, cigarettes, uncomplicated   Essential hypertension   Polysubstance abuse ([place])   Hemodialysis catheter malfunction ([place])   Complication of AV dialysis fistula, sequela  Resolved Severe hyperkalemia secondary to missing 2 hemodialysis sessions in the setting of ESRD HD TTS Urgently  taken for dialysis on 07/28/20 Hyperkalemia has resolved post hemodialysis Bleeding from fistula needing intervention.  Vascular surgery taken for oversewing of left arm brachial cephalic fistula on 0/8/67 unable to save fistula, ligated. Completed venous mapping by vascular surgery with plan for R AV fistula or AV graft on Thursday by vascular surgery.  Transient fever, unclear etiology, possibly from atelectasis.  Febrile overnight with T-max 101.5.  She is somnolent this morning but easily arousable to voices and answers questions appropriately.  She denies any cardiopulmonary or GI symptoms.  States she no longer makes urine.  Blood cultures obtained, follow cultures.  Vital signs are stable, no leukocytosis.  No lower extremity edema or tenderness.  Will hold off on antibiotics for now. Chest x-ray showed findings suggestive of chronic scarring, but no acute superimposed edema or inflammation cannot be excluded. Start incentive spirometer Continue to monitor  ESRD on HD TTS Noncompliance Nephrology following Vascular surgery following for hemodialysis access. Post oversewing of left AV fistula on 07/28/2020, unable to save. Post vein mapping today by vascular surgery on 07/29/20. Completed venous mapping by vascular surgery with plan for R AV fistula or AV graft on Thursday by vascular surgery.  Euvolemic hyponatremia, mild  Serum Na+ 131 Possibly from dilution, mild volume overload in the setting of ESRD Volume status and electrolytes addressed with HD  Chronic combined systolic and diastolic CHF Last 2D echo done on 02/17/2017 showed LVEF 40 to 45% and grade 2 diastolic dysfunction Volume status addressed with hemodialysis Currently on Coreg  SLE Not currently on immunomodulating therapy  Hx of polysubstance abuse including tobacco, cocaine and marijuana  No UDS done on admission Counseling at bedside     Code Status: Full code  Family Communication: None  at  bedside   Consultants:  Nephrology  Vascular surgery  Procedures:  Oversewing of left AV fistula  Antimicrobials:  None  DVT prophylaxis: Subcu heparin 3 times daily  Status is: Observation    Dispo:  Patient From: Home  Planned Disposition: Home  Expected discharge date: 08/02/20  Medically stable for discharge: No, ongoing management for hemodialysis access.    Objective: Vitals:   07/30/20 2338 07/31/20 0510 07/31/20 0844 07/31/20 1227  BP: 134/79 136/88 126/88 126/90  Pulse: 73 91 81 81  Resp: 15 19 19 19   Temp: 99.6 F (37.6 C) 100.2 F (37.9 C) 98.4 F (36.9 C) 98.6 F (37 C)  TempSrc: Oral Oral Oral Oral  SpO2: 94% 92% 99% 98%  Weight:      Height:        Intake/Output Summary (Last 24 hours) at 07/31/2020 1329 Last data filed at 07/31/2020 1321 Gross per 24 hour  Intake 360 ml  Output --  Net 360 ml   Filed Weights   07/28/20 0744 07/28/20 0954 07/30/20 0653  Weight: 46.9 kg 46.9 kg 49.9 kg    Exam:  . General: 39 y.o. year-old female well-developed well-nourished somnolent but easily arousable to voices.  Answers questions appropriately. . Cardiovascular: Regular rate and rhythm no rubs or gallops.   Marland Kitchen Respiratory: Clear to auscultation no wheezes or rales.   . Abdomen: Soft nontender normal bowel sounds present. . Musculoskeletal: Mild edema left upper extremity at site of ligation.  Marland Kitchen  Psychiatry: Unable to assess mood due to somnolence.  Data Reviewed: CBC: Recent Labs  Lab 07/28/20 0420 07/28/20 0440 07/28/20 1055 07/28/20 1630 07/29/20 0342 07/30/20 0707 07/31/20 0914  WBC 5.4  --   --  3.9* 3.7* 7.6 5.6  NEUTROABS 3.3  --   --   --  2.1  --  4.0  HGB 10.3*   < > 9.9* 9.1* 8.7* 8.4* 8.9*  HCT 34.9*   < > 29.0* 30.7* 28.6* 28.2* 28.9*  MCV 89.0  --   --  88.2 88.8 89.2 87.8  PLT 269  --   --  222 242 260 244   < > = values in this interval not displayed.   Basic Metabolic Panel: Recent Labs  Lab 07/28/20 0420  07/28/20 0440 07/28/20 1055 07/28/20 1630 07/29/20 0342 07/30/20 0717 07/31/20 0826  NA 135   < > 138 136 137 131* 132*  K >7.5*   < > 2.9* 4.5 4.8 5.0 4.4  CL 91*   < > 97* 97* 97* 95* 98  CO2 19*  --   --  21* 27 24 26   GLUCOSE 91   < > 129* 158* 133* 110* 84  BUN 137*   < > 54* 62* 77* 88* 31*  CREATININE 18.31*   < > 10.00* 10.48* 11.66* 12.82* 7.56*  CALCIUM 9.2  --   --  8.8* 9.1 9.4 9.0  MG  --   --   --   --  2.4  --   --   PHOS  --   --   --   --   --  8.3*  --    < > = values in this interval not displayed.   GFR: Estimated Creatinine Clearance: 8.1 mL/min (A) (by C-G formula based on SCr of 7.56 mg/dL (H)). Liver Function Tests: Recent Labs  Lab 07/29/20 0342 07/30/20 0717 07/31/20 0826  AST 28  --  20  ALT 12  --  6  ALKPHOS 146*  --  114  BILITOT 0.4  --  0.6  PROT 7.0  --  6.9  ALBUMIN 2.1* 2.1* 2.0*   No results for input(s): LIPASE, AMYLASE in the last 168 hours. No results for input(s): AMMONIA in the last 168 hours. Coagulation Profile: No results for input(s): INR, PROTIME in the last 168 hours. Cardiac Enzymes: No results for input(s): CKTOTAL, CKMB, CKMBINDEX, TROPONINI in the last 168 hours. BNP (last 3 results) No results for input(s): PROBNP in the last 8760 hours. HbA1C: No results for input(s): HGBA1C in the last 72 hours. CBG: Recent Labs  Lab 07/28/20 0516 07/28/20 1151 07/28/20 1301  GLUCAP 113* 79 77   Lipid Profile: No results for input(s): CHOL, HDL, LDLCALC, TRIG, CHOLHDL, LDLDIRECT in the last 72 hours. Thyroid Function Tests: No results for input(s): TSH, T4TOTAL, FREET4, T3FREE, THYROIDAB in the last 72 hours. Anemia Panel: No results for input(s): VITAMINB12, FOLATE, FERRITIN, TIBC, IRON, RETICCTPCT in the last 72 hours. Urine analysis:    Component Value Date/Time   COLORURINE YELLOW 09/25/2015 1412   APPEARANCEUR CLOUDY (A) 09/25/2015 1412   LABSPEC 1.018 09/25/2015 1412   PHURINE 8.0 09/25/2015 1412   GLUCOSEU  NEGATIVE 09/25/2015 1412   HGBUR LARGE (A) 09/25/2015 1412   BILIRUBINUR NEGATIVE 09/25/2015 1412   KETONESUR NEGATIVE 09/25/2015 1412   PROTEINUR >300 (A) 09/25/2015 1412   UROBILINOGEN 0.2 09/25/2015 1412   NITRITE NEGATIVE 09/25/2015 1412   LEUKOCYTESUR MODERATE (A) 09/25/2015 1412   Sepsis Labs: @LABRCNTIP (procalcitonin:4,lacticidven:4)  ) Recent Results (from the past 240 hour(s))  SARS Coronavirus 2 by RT PCR (hospital order, performed in [facility] hospital lab) Nasopharyngeal Nasopharyngeal Swab     Status: None   Collection Time: 07/28/20  4:43 AM   Specimen: Nasopharyngeal Swab  Result Value Ref Range Status   SARS Coronavirus 2 NEGATIVE NEGATIVE Final    Comment: (NOTE) SARS-CoV-2 target nucleic acids are NOT DETECTED.  The SARS-CoV-2 RNA is generally detectable in upper and lower respiratory specimens during the acute phase of infection. The lowest concentration of SARS-CoV-2 viral copies this assay can detect is 250 copies / mL. A negative result does not preclude SARS-CoV-2 infection and should not be used as the sole basis for treatment or other patient management decisions.  A negative result may occur with improper specimen collection / handling, submission of specimen other than nasopharyngeal swab, presence of viral mutation(s) within the areas targeted by this assay, and inadequate number of viral copies (<250 copies / mL). A negative result must be combined with clinical observations, patient history, and epidemiological information.  Fact Sheet for Patients:   StrictlyIdeas.no  Fact Sheet for Healthcare Providers: BankingDealers.co.za  This test is not yet approved or  cleared by the [place] FDA and has been authorized for detection and/or diagnosis of SARS-CoV-2 by FDA under an Emergency Use Authorization (EUA).  This EUA will remain in effect (meaning this test can be used) for the duration of  the COVID-19 declaration under Section 564(b)(1) of the Act, 21 U.S.C. section 360bbb-3(b)(1), unless the authorization is terminated or revoked sooner.  Performed at [facility], [place] [address] [place], [place] [address]   Blood culture (routine x 2)     Status: None (Preliminary result)   Collection Time: 07/28/20  6:50 AM   Specimen: BLOOD LEFT WRIST  Result Value Ref Range Status   Specimen Description BLOOD LEFT WRIST  Final   Special Requests   Final  BOTTLES DRAWN AEROBIC AND ANAEROBIC Blood Culture results may not be optimal due to an inadequate volume of blood received in culture bottles   Culture   Final    NO GROWTH 3 DAYS Performed at [facility], [place] [address], [place], [place] [address]    Report Status PENDING  Incomplete  Blood culture (routine x 2)     Status: None (Preliminary result)   Collection Time: 07/28/20  6:57 AM   Specimen: BLOOD LEFT HAND  Result Value Ref Range Status   Specimen Description BLOOD LEFT HAND  Final   Special Requests   Final    BOTTLES DRAWN AEROBIC AND ANAEROBIC Blood Culture results may not be optimal due to an inadequate volume of blood received in culture bottles   Culture   Final    NO GROWTH 3 DAYS Performed at [facility], [place] [address], [place],  [address]    Report Status PENDING  Incomplete      Studies: DG CHEST PORT 1 VIEW  Result Date: 07/31/2020 CLINICAL DATA:  Fever, shortness of breath. EXAM: PORTABLE CHEST 1 VIEW COMPARISON:  July 28, 2020. FINDINGS: Stable cardiomegaly. Left-sided dialysis catheter is unchanged in position. No pneumothorax is noted. Stable left basilar subsegmental atelectasis is noted with probable small left pleural effusion. Stable mild reticular and interstitial densities are noted throughout both lungs which may represent chronic scarring, but acute superimposed edema or inflammation cannot be excluded. Bony thorax is unremarkable. IMPRESSION: Stable mild  reticular and interstitial densities are noted throughout both lungs which may represent chronic scarring, but acute superimposed edema or inflammation cannot be excluded. Electronically Signed   By: Marijo Conception M.D.   On: 07/31/2020 08:35    Scheduled Meds: . sodium chloride   Intravenous Once  . calcitRIOL  2 mcg Oral Q T,Th,Sa-HD  . calcium acetate  1,334 mg Oral With snacks  . calcium acetate  2,668 mg Oral TID WC  . carvedilol  12.5 mg Oral Daily  . carvedilol  12.5 mg Oral Once per day on Sun Mon Wed Fri  . Chlorhexidine Gluconate Cloth  6 each Topical Q0600  . cinacalcet  30 mg Oral Q breakfast  . darbepoetin (ARANESP) injection - DIALYSIS  60 mcg Intravenous Q Tue-HD  . heparin  5,000 Units Subcutaneous Q8H  . hydrOXYzine  10 mg Oral Once  . loperamide  4 mg Oral Once per day on Tue Thu Sat  . multivitamin  1 tablet Oral QHS    Continuous Infusions: . [START ON 08/01/2020]  ceFAZolin (ANCEF) IV       LOS: 1 day     Kayleen Memos, MD [facility] Pager [phone]  If 7PM-7AM, please contact night-coverage [email or web address] Password [email or web address] 07/31/2020, 1:29 PM

## 2020-07-31 NOTE — Anesthesia Preprocedure Evaluation (Addendum)
Anesthesia Evaluation  Patient identified by MRN, date of birth, ID band Patient awake    Reviewed: Allergy & Precautions  Airway Mallampati: II  TM Distance: >3 FB     Dental   Pulmonary shortness of breath, pneumonia, Current Smoker and Patient abstained from smoking.,    breath sounds clear to auscultation       Cardiovascular hypertension, +CHF   Rhythm:Regular Rate:Normal     Neuro/Psych Seizures -,     GI/Hepatic negative GI ROS, Neg liver ROS,   Endo/Other    Renal/GU Renal disease     Musculoskeletal   Abdominal   Peds  Hematology  (+) anemia ,   Anesthesia Other Findings   Reproductive/Obstetrics                           Anesthesia Physical Anesthesia Plan  ASA: III  Anesthesia Plan: MAC   Post-op Pain Management:    Induction: Intravenous  PONV Risk Score and Plan: 2 and Dexamethasone, Ondansetron and Midazolam  Airway Management Planned: Simple Face Mask  Additional Equipment:   Intra-op Plan:   Post-operative Plan:   Informed Consent: I have reviewed the patients History and Physical, chart, labs and discussed the procedure including the risks, benefits and alternatives for the proposed anesthesia with the patient or authorized representative who has indicated his/her understanding and acceptance.     Dental advisory given  Plan Discussed with: CRNA and Anesthesiologist  Anesthesia Plan Comments:        Anesthesia Quick Evaluation

## 2020-08-01 ENCOUNTER — Inpatient Hospital Stay (HOSPITAL_COMMUNITY): Payer: Medicaid Other | Admitting: Anesthesiology

## 2020-08-01 ENCOUNTER — Encounter (HOSPITAL_COMMUNITY)
Admission: EM | Disposition: A | Discharge status: Home / Self Care | Payer: Self-pay | Source: Home / Self Care | Attending: Internal Medicine

## 2020-08-01 DIAGNOSIS — N185 Chronic kidney disease, stage 5: Secondary | ICD-10-CM

## 2020-08-01 HISTORY — PX: AV FISTULA PLACEMENT: SHX1204

## 2020-08-01 LAB — POCT I-STAT, CHEM 8
BUN: 28 mg/dL — ABNORMAL HIGH (ref 6–20)
Calcium, Ion: 0.95 mmol/L — ABNORMAL LOW (ref 1.15–1.40)
Chloride: 110 mmol/L (ref 98–111)
Creatinine, Ser: 6.1 mg/dL — ABNORMAL HIGH (ref 0.44–1.00)
Glucose, Bld: 55 mg/dL — ABNORMAL LOW (ref 70–99)
HCT: 22 % — ABNORMAL LOW (ref 36.0–46.0)
Hemoglobin: 7.5 g/dL — ABNORMAL LOW (ref 12.0–15.0)
Potassium: 3.1 mmol/L — ABNORMAL LOW (ref 3.5–5.1)
Sodium: 144 mmol/L (ref 135–145)
TCO2: 15 mmol/L — ABNORMAL LOW (ref 22–32)

## 2020-08-01 LAB — CBC
HCT: 27.5 % — ABNORMAL LOW (ref 36.0–46.0)
Hemoglobin: 8.1 g/dL — ABNORMAL LOW (ref 12.0–15.0)
MCH: 26.1 pg (ref 26.0–34.0)
MCHC: 29.5 g/dL — ABNORMAL LOW (ref 30.0–36.0)
MCV: 88.7 fL (ref 80.0–100.0)
Platelets: 274 10*3/uL (ref 150–400)
RBC: 3.1 MIL/uL — ABNORMAL LOW (ref 3.87–5.11)
RDW: 18.7 % — ABNORMAL HIGH (ref 11.5–15.5)
WBC: 6.2 10*3/uL (ref 4.0–10.5)
nRBC: 0 % (ref 0.0–0.2)

## 2020-08-01 SURGERY — ARTERIOVENOUS (AV) FISTULA CREATION
Anesthesia: Monitor Anesthesia Care | Laterality: Right

## 2020-08-01 MED ORDER — LIDOCAINE 2% (20 MG/ML) 5 ML SYRINGE
INTRAMUSCULAR | Status: AC
  Filled 2020-08-01: qty 5

## 2020-08-01 MED ORDER — SODIUM CHLORIDE (PF) 0.9 % IJ SOLN
INTRAMUSCULAR | Status: AC
  Filled 2020-08-01: qty 10

## 2020-08-01 MED ORDER — LIDOCAINE-EPINEPHRINE 0.5 %-1:200000 IJ SOLN
INTRAMUSCULAR | Status: AC
  Filled 2020-08-01: qty 1

## 2020-08-01 MED ORDER — FENTANYL CITRATE (PF) 100 MCG/2ML IJ SOLN: 25.0000 ug | INTRAMUSCULAR | Status: DC | PRN

## 2020-08-01 MED ORDER — SODIUM CHLORIDE 0.9 % IV SOLN
INTRAVENOUS | Status: DC | PRN
  Administered 2020-08-01: 500 mL

## 2020-08-01 MED ORDER — SODIUM CHLORIDE 0.9 % IV SOLN
INTRAVENOUS | Status: AC
  Filled 2020-08-01: qty 1.2

## 2020-08-01 MED ORDER — SUCCINYLCHOLINE CHLORIDE 200 MG/10ML IV SOSY
PREFILLED_SYRINGE | INTRAVENOUS | Status: AC
  Filled 2020-08-01: qty 10

## 2020-08-01 MED ORDER — SODIUM CHLORIDE 0.9 % IV SOLN: INTRAVENOUS | Status: DC | PRN

## 2020-08-01 MED ORDER — GLYCOPYRROLATE PF 0.2 MG/ML IJ SOSY
PREFILLED_SYRINGE | INTRAMUSCULAR | Status: AC
  Filled 2020-08-01: qty 1

## 2020-08-01 MED ORDER — PROPOFOL 10 MG/ML IV BOLUS
INTRAVENOUS | Status: AC
  Filled 2020-08-01: qty 20

## 2020-08-01 MED ORDER — EPHEDRINE 5 MG/ML INJ
INTRAVENOUS | Status: AC
  Filled 2020-08-01: qty 10

## 2020-08-01 MED ORDER — MIDAZOLAM HCL 2 MG/2ML IJ SOLN
INTRAMUSCULAR | Status: AC
  Filled 2020-08-01: qty 2

## 2020-08-01 MED ORDER — HEPARIN SODIUM (PORCINE) 1000 UNIT/ML IJ SOLN
INTRAMUSCULAR | Status: AC
  Administered 2020-08-01: 3200 [IU]
  Filled 2020-08-01: qty 4

## 2020-08-01 MED ORDER — ONDANSETRON HCL 4 MG/2ML IJ SOLN
INTRAMUSCULAR | Status: AC
  Filled 2020-08-01: qty 2

## 2020-08-01 MED ORDER — MIDAZOLAM HCL 2 MG/2ML IJ SOLN
INTRAMUSCULAR | Status: DC | PRN
  Administered 2020-08-01: 1 mg via INTRAVENOUS

## 2020-08-01 MED ORDER — FENTANYL CITRATE (PF) 250 MCG/5ML IJ SOLN
INTRAMUSCULAR | Status: AC
  Filled 2020-08-01: qty 5

## 2020-08-01 MED ORDER — NEOSTIGMINE METHYLSULFATE 3 MG/3ML IV SOSY
PREFILLED_SYRINGE | INTRAVENOUS | Status: AC
  Filled 2020-08-01: qty 3

## 2020-08-01 MED ORDER — 0.9 % SODIUM CHLORIDE (POUR BTL) OPTIME
TOPICAL | Status: DC | PRN
  Administered 2020-08-01: 1000 mL

## 2020-08-01 MED ORDER — MORPHINE SULFATE (PF) 2 MG/ML IV SOLN
INTRAVENOUS | Status: AC
  Filled 2020-08-01: qty 1

## 2020-08-01 MED ORDER — ROCURONIUM BROMIDE 10 MG/ML (PF) SYRINGE
PREFILLED_SYRINGE | INTRAVENOUS | Status: AC
  Filled 2020-08-01: qty 10

## 2020-08-01 MED ORDER — ARTIFICIAL TEARS OPHTHALMIC OINT
TOPICAL_OINTMENT | OPHTHALMIC | Status: AC
  Filled 2020-08-01: qty 3.5

## 2020-08-01 MED ORDER — CALCITRIOL 0.5 MCG PO CAPS
ORAL_CAPSULE | ORAL | Status: AC
  Filled 2020-08-01: qty 4

## 2020-08-01 MED ORDER — LIDOCAINE 2% (20 MG/ML) 5 ML SYRINGE
INTRAMUSCULAR | Status: DC | PRN
  Administered 2020-08-01: 50 mg via INTRAVENOUS

## 2020-08-01 MED ORDER — LIDOCAINE-EPINEPHRINE 0.5 %-1:200000 IJ SOLN
INTRAMUSCULAR | Status: DC | PRN
  Administered 2020-08-01: 7 mL

## 2020-08-01 MED ORDER — PHENYLEPHRINE 40 MCG/ML (10ML) SYRINGE FOR IV PUSH (FOR BLOOD PRESSURE SUPPORT)
PREFILLED_SYRINGE | INTRAVENOUS | Status: AC
  Filled 2020-08-01: qty 10

## 2020-08-01 MED ORDER — PROPOFOL 500 MG/50ML IV EMUL
INTRAVENOUS | Status: DC | PRN
  Administered 2020-08-01: 75 ug/kg/min via INTRAVENOUS

## 2020-08-01 MED ORDER — ONDANSETRON HCL 4 MG/2ML IJ SOLN
INTRAMUSCULAR | Status: DC | PRN
  Administered 2020-08-01: 4 mg via INTRAVENOUS

## 2020-08-01 SURGICAL SUPPLY — 33 items
ADH SKN CLS APL DERMABOND .7 (GAUZE/BANDAGES/DRESSINGS) ×1
ARMBAND PINK RESTRICT EXTREMIT (MISCELLANEOUS) ×6 IMPLANT
CANISTER SUCT 3000ML PPV (MISCELLANEOUS) ×3 IMPLANT
CANNULA VESSEL 3MM 2 BLNT TIP (CANNULA) ×3 IMPLANT
CLIP LIGATING EXTRA MED SLVR (CLIP) ×3 IMPLANT
CLIP LIGATING EXTRA SM BLUE (MISCELLANEOUS) ×3 IMPLANT
COVER PROBE W GEL 5X96 (DRAPES) ×3 IMPLANT
COVER WAND RF STERILE (DRAPES) ×1 IMPLANT
DECANTER SPIKE VIAL GLASS SM (MISCELLANEOUS) ×3 IMPLANT
DERMABOND ADVANCED (GAUZE/BANDAGES/DRESSINGS) ×2
DERMABOND ADVANCED .7 DNX12 (GAUZE/BANDAGES/DRESSINGS) ×1 IMPLANT
ELECT REM PT RETURN 9FT ADLT (ELECTROSURGICAL) ×3
ELECTRODE REM PT RTRN 9FT ADLT (ELECTROSURGICAL) ×1 IMPLANT
GLOVE BIOGEL PI IND STRL 6.5 (GLOVE) IMPLANT
GLOVE BIOGEL PI INDICATOR 6.5 (GLOVE) ×4
GLOVE ECLIPSE 7.5 STRL STRAW (GLOVE) ×2 IMPLANT
GLOVE SS BIOGEL STRL SZ 7.5 (GLOVE) ×1 IMPLANT
GLOVE SUPERSENSE BIOGEL SZ 7.5 (GLOVE) ×2
GOWN STRL REUS W/ TWL LRG LVL3 (GOWN DISPOSABLE) ×3 IMPLANT
GOWN STRL REUS W/TWL LRG LVL3 (GOWN DISPOSABLE) ×9
KIT BASIN OR (CUSTOM PROCEDURE TRAY) ×3 IMPLANT
KIT TURNOVER KIT B (KITS) ×3 IMPLANT
NS IRRIG 1000ML POUR BTL (IV SOLUTION) ×3 IMPLANT
PACK CV ACCESS (CUSTOM PROCEDURE TRAY) ×3 IMPLANT
PAD ARMBOARD 7.5X6 YLW CONV (MISCELLANEOUS) ×6 IMPLANT
SUT PROLENE 6 0 CC (SUTURE) ×5 IMPLANT
SUT SILK 3 0 (SUTURE) ×3
SUT SILK 3-0 18XBRD TIE 12 (SUTURE) IMPLANT
SUT VIC AB 3-0 SH 27 (SUTURE) ×3
SUT VIC AB 3-0 SH 27X BRD (SUTURE) ×1 IMPLANT
TOWEL GREEN STERILE (TOWEL DISPOSABLE) ×3 IMPLANT
UNDERPAD 30X36 HEAVY ABSORB (UNDERPADS AND DIAPERS) ×3 IMPLANT
WATER STERILE IRR 1000ML POUR (IV SOLUTION) ×3 IMPLANT

## 2020-08-01 NOTE — Anesthesia Postprocedure Evaluation (Signed)
Anesthesia Post Note  Patient: [name]  Procedure(s) Performed: RIGHT ARM BRACHIOCEPHALIC  ARTERIOVENOUS (AV) FISTULA CREATION (Right )     Patient location during evaluation: PACU Anesthesia Type: MAC Level of consciousness: awake Pain management: pain level controlled Vital Signs Assessment: post-procedure vital signs reviewed and stable Respiratory status: spontaneous breathing Cardiovascular status: stable Postop Assessment: no apparent nausea or vomiting Anesthetic complications: no   No complications documented.  Last Vitals:  Vitals:   08/01/20 1558 08/01/20 1600  BP: (!) 162/91 135/87  Pulse: 80 80  Resp: (!) 27 (!) 21  Temp:  37.2 C  SpO2:  94%    Last Pain:  Vitals:   08/01/20 1600  TempSrc: Oral  PainSc:                  Jerremy Maione

## 2020-08-01 NOTE — Anesthesia Procedure Notes (Signed)
Procedure Name: MAC Date/Time: 08/01/2020 7:39 AM Performed by: Janace Litten, CRNA Pre-anesthesia Checklist: Patient identified, Emergency Drugs available, Suction available and Patient being monitored Patient Re-evaluated:Patient Re-evaluated prior to induction Oxygen Delivery Method: Simple face mask

## 2020-08-01 NOTE — Progress Notes (Signed)
Patient ID: [name], female   DOB: May 10, 1981, 39 y.o.   MRN: [id number] The patient reports some tingling in her right hand on return from hemodialysis.  She reports mild discomfort in her antecubital incision and reports that there is less pain in her right than in her left arm.  She reports that her hand has a sensation of coolness although she notes that it is not cold.  She does have a palpable right radial pulse.  Her hand is warm.  She has normal grip strength.  Palpable thrill in her fistula  Probable mild steal in her right hand.  I discussed this with the patient.  I explained that we will keep her n.p.o. tonight in case she would have to have ligation of her fistula.  Hopefully this will not be the case because her next option would be a femoral graft.

## 2020-08-01 NOTE — Anesthesia Postprocedure Evaluation (Signed)
Anesthesia Post Note  Patient: [name]  Procedure(s) Performed: RIGHT ARM BRACHIOCEPHALIC  ARTERIOVENOUS (AV) FISTULA CREATION (Right )     Patient location during evaluation: Endoscopy Anesthesia Type: MAC Level of consciousness: awake Pain management: pain level controlled Vital Signs Assessment: post-procedure vital signs reviewed and stable Respiratory status: spontaneous breathing Cardiovascular status: stable Postop Assessment: no apparent nausea or vomiting Anesthetic complications: no   No complications documented.  Last Vitals:  Vitals:   08/01/20 1430 08/01/20 1500  BP: (!) 154/99 (!) 147/87  Pulse:    Resp:    Temp:    SpO2:      Last Pain:  Vitals:   08/01/20 1220  TempSrc: Oral  PainSc:                  Martese Vanatta

## 2020-08-01 NOTE — Progress Notes (Signed)
Pt back from HD complaining of tingling in her right hand.  Hand is warm and dry with palpable radial pulse.  MD notified, await further orders.

## 2020-08-01 NOTE — Progress Notes (Addendum)
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  Admitted early this AM with severe hyperkalemia after missing sessions of hemodialysis.  Urgently hemodialysis for K+ 7.4.  Bleeding from fistula needing intervention.  Vascular surgery taken for oversewing of left arm brachial cephalic fistula on 02/04/24 unable to save fistula, post ligation of left brachial cephalic AV fistula.  HD 8/3.  Plan for R AV fistula or AV graft on Thursday by vascular surgery.  T-max 101.5 on 07/31/20. Blood cultures obtained, follow cultures.  Vital signs are stable, no leukocytosis.  Will hold off on antibiotics for now.  08/01/20: Post R arm brachiocephalic AV fistula creation by Dr. Donnetta Hutching vascular surgery. HD planned today.  Saw patient in the HD center, 2L fluid removed.    She states she is going home no matter what.  Expressed my concern about recent fever and elevated procalcitonin. Asked if we could monitor overnight and repeat CBC with diff, procalcitonin in the AM.  Didn't want to hear it.  States she would like to speak with the admitting physician.  Asked if there was anything I could hep her with but she would not say.   Assessment/Plan: Principal Problem:   Acute hyperkalemia Active Problems:   SLE (systemic lupus erythematosus) (HCC)   Chronic combined systolic (congestive) and diastolic (congestive) heart failure (HCC)   ESRD (end stage renal disease) on dialysis (HCC)   Nicotine  dependence, cigarettes, uncomplicated   Essential hypertension   Polysubstance abuse ([place])   Hemodialysis catheter malfunction ([place])   Complication of AV dialysis fistula, sequela  Resolved Severe hyperkalemia secondary to missing 2 hemodialysis sessions in the setting of ESRD HD TTS Urgently taken for dialysis on 07/28/20 Hyperkalemia has resolved post hemodialysis Bleeding from fistula needing intervention.  Vascular surgery taken for oversewing of left arm brachial cephalic fistula on 03/03/63 unable to save fistula, ligated. Completed venous mapping by vascular surgery with plan for R AV fistula or AV graft on Thursday by vascular surgery.  Fever, unclear etiology. Febrile 8/4 with T-max 101.5. elevated procalcitonin >4.0  Afebrile last 24 hours with Tmax 99 Blood cx negative to date, continue to follow cultures Will hold off on antibiotics for now, monitor overnight and repeat labs in the AM. Monitor fever curve and WBC Continue incentive spirometer Repeat CBC with differential and procalcitonin tomorrow  ESRD on HD TTS Noncompliance Nephrology following Vascular surgery following for hemodialysis access. Post oversewing of left AV fistula on 07/28/2020, unable to save. Post vein mapping today by vascular surgery on 07/29/20. Post R arm brachiocephalic AV fistula creation by Dr. Donnetta Hutching vascular surgery on 08/01/20. HD planned today. Appreciate specialists assistance.  Resolved euvolemic hyponatremia  Serum Na+ 131> 144 Possibly from dilution, in the setting of ESRD Volume status and electrolytes addressed with HD  Hypokalemia Serum potassium 3.1 Managed by hemodialysis  Chronic combined systolic and diastolic CHF Last 2D echo done on 02/17/2017 showed LVEF 40 to 45% and grade 2 diastolic dysfunction Volume status addressed with hemodialysis Currently on Coreg  SLE Not currently on immunomodulating therapy  Hx  of polysubstance abuse including tobacco, cocaine and marijuana  No  UDS done on admission Counseling at bedside     Code Status: Full code  Family Communication: None at bedside   Consultants:  Nephrology  Vascular surgery  Procedures:  Oversewing of left AV fistula  Antimicrobials:  None  DVT prophylaxis: Subcu heparin 3 times daily  Status is: Observation    Dispo:  Patient From: Home  Planned Disposition: Home  Expected discharge date: 08/02/20  Medically stable for discharge: No, recent fever 101, elevated procalcitonin, need to r/o active infective process.   Objective: Vitals:   08/01/20 1330 08/01/20 1400 08/01/20 1430 08/01/20 1500  BP: (!) 151/103 132/64 (!) 154/99 (!) 147/87  Pulse: 81 79    Resp:      Temp:      TempSrc:      SpO2:      Weight:      Height:        Intake/Output Summary (Last 24 hours) at 08/01/2020 1525 Last data filed at 08/01/2020 2440 Gross per 24 hour  Intake 300 ml  Output 10 ml  Net 290 ml   Filed Weights   07/28/20 0954 07/30/20 0653 08/01/20 1220  Weight: 46.9 kg 49.9 kg 49.1 kg    Exam: Declines physical exam.  . General: 39 y.o. year-old female well-developed well-nourished no acute distress.  A&O x 3. . Cardiovascular: Rate 80 on the monitor . Musculoskeletal: No lower extremity edema.  Marland Kitchen Psychiatry: Mood is irritable.   Data Reviewed: CBC: Recent Labs  Lab 07/28/20 0420 07/28/20 0440 07/28/20 1630 07/28/20 1630 07/29/20 0342 07/30/20 0707 07/31/20 0914 08/01/20 0712 08/01/20 1221  WBC 5.4   < > 3.9*  --  3.7* 7.6 5.6  --  6.2  NEUTROABS 3.3  --   --   --  2.1  --  4.0  --   --   HGB 10.3*   < > 9.1*   < > 8.7* 8.4* 8.9* 7.5* 8.1*  HCT 34.9*   < > 30.7*   < > 28.6* 28.2* 28.9* 22.0* 27.5*  MCV 89.0   < > 88.2  --  88.8 89.2 87.8  --  88.7  PLT 269   < > 222  --  242 260 244  --  274   < > = values in this interval not displayed.   Basic Metabolic Panel: Recent Labs  Lab 07/28/20 0420 07/28/20 0440 07/28/20 1630 07/29/20 0342 07/30/20 0717  07/31/20 0826 08/01/20 0712  NA 135   < > 136 137 131* 132* 144  K >7.5*   < > 4.5 4.8 5.0 4.4 3.1*  CL 91*   < > 97* 97* 95* 98 110  CO2 19*  --  21* 27 24 26   --   GLUCOSE 91   < > 158* 133* 110* 84 55*  BUN 137*   < > 62* 77* 88* 31* 28*  CREATININE 18.31*   < > 10.48* 11.66* 12.82* 7.56* 6.10*  CALCIUM 9.2  --  8.8* 9.1 9.4 9.0  --   MG  --   --   --  2.4  --   --   --   PHOS  --   --   --   --  8.3*  --   --    < > = values in this interval not displayed.   GFR: Estimated Creatinine Clearance: 9.9 mL/min (A) (by C-G formula based on SCr of 6.1 mg/dL (  H)). Liver Function Tests: Recent Labs  Lab 07/29/20 0342 07/30/20 0717 07/31/20 0826  AST 28  --  20  ALT 12  --  6  ALKPHOS 146*  --  114  BILITOT 0.4  --  0.6  PROT 7.0  --  6.9  ALBUMIN 2.1* 2.1* 2.0*   No results for input(s): LIPASE, AMYLASE in the last 168 hours. No results for input(s): AMMONIA in the last 168 hours. Coagulation Profile: No results for input(s): INR, PROTIME in the last 168 hours. Cardiac Enzymes: No results for input(s): CKTOTAL, CKMB, CKMBINDEX, TROPONINI in the last 168 hours. BNP (last 3 results) No results for input(s): PROBNP in the last 8760 hours. HbA1C: No results for input(s): HGBA1C in the last 72 hours. CBG: Recent Labs  Lab 07/28/20 0516 07/28/20 1151 07/28/20 1301  GLUCAP 113* 79 77   Lipid Profile: No results for input(s): CHOL, HDL, LDLCALC, TRIG, CHOLHDL, LDLDIRECT in the last 72 hours. Thyroid Function Tests: No results for input(s): TSH, T4TOTAL, FREET4, T3FREE, THYROIDAB in the last 72 hours. Anemia Panel: No results for input(s): VITAMINB12, FOLATE, FERRITIN, TIBC, IRON, RETICCTPCT in the last 72 hours. Urine analysis:    Component Value Date/Time   COLORURINE YELLOW 09/25/2015 1412   APPEARANCEUR CLOUDY (A) 09/25/2015 1412   LABSPEC 1.018 09/25/2015 1412   PHURINE 8.0 09/25/2015 1412   GLUCOSEU NEGATIVE 09/25/2015 1412   HGBUR LARGE (A) 09/25/2015 1412    BILIRUBINUR NEGATIVE 09/25/2015 1412   KETONESUR NEGATIVE 09/25/2015 1412   PROTEINUR >300 (A) 09/25/2015 1412   UROBILINOGEN 0.2 09/25/2015 1412   NITRITE NEGATIVE 09/25/2015 1412   LEUKOCYTESUR MODERATE (A) 09/25/2015 1412   Sepsis Labs: @LABRCNTIP (procalcitonin:4,lacticidven:4)  ) Recent Results (from the past 240 hour(s))  SARS Coronavirus 2 by RT PCR (hospital order, performed in [place] hospital lab) Nasopharyngeal Nasopharyngeal Swab     Status: None   Collection Time: 07/28/20  4:43 AM   Specimen: Nasopharyngeal Swab  Result Value Ref Range Status   SARS Coronavirus 2 NEGATIVE NEGATIVE Final    Comment: (NOTE) SARS-CoV-2 target nucleic acids are NOT DETECTED.  The SARS-CoV-2 RNA is generally detectable in upper and lower respiratory specimens during the acute phase of infection. The lowest concentration of SARS-CoV-2 viral copies this assay can detect is 250 copies / mL. A negative result does not preclude SARS-CoV-2 infection and should not be used as the sole basis for treatment or other patient management decisions.  A negative result may occur with improper specimen collection / handling, submission of specimen other than nasopharyngeal swab, presence of viral mutation(s) within the areas targeted by this assay, and inadequate number of viral copies (<250 copies / mL). A negative result must be combined with clinical observations, patient history, and epidemiological information.  Fact Sheet for Patients:   StrictlyIdeas.no  Fact Sheet for Healthcare Providers: BankingDealers.co.za  This test is not yet approved or  cleared by the [place] FDA and has been authorized for detection and/or diagnosis of SARS-CoV-2 by FDA under an Emergency Use Authorization (EUA).  This EUA will remain in effect (meaning this test can be used) for the duration of the COVID-19 declaration under Section 564(b)(1) of the Act, 21  U.S.C. section 360bbb-3(b)(1), unless the authorization is terminated or revoked sooner.  Performed at [facility], [place] [address], [place], [place] [address]   Blood culture (routine x 2)     Status: None (Preliminary result)   Collection Time: 07/28/20  6:50 AM  Specimen: BLOOD LEFT WRIST  Result Value Ref Range Status   Specimen Description BLOOD LEFT WRIST  Final   Special Requests   Final    BOTTLES DRAWN AEROBIC AND ANAEROBIC Blood Culture results may not be optimal due to an inadequate volume of blood received in culture bottles   Culture   Final    NO GROWTH 4 DAYS Performed at [facility], [place] [address] [place], [place] [address]    Report Status PENDING  Incomplete  Blood culture (routine x 2)     Status: None (Preliminary result)   Collection Time: 07/28/20  6:57 AM   Specimen: BLOOD LEFT HAND  Result Value Ref Range Status   Specimen Description BLOOD LEFT HAND  Final   Special Requests   Final    BOTTLES DRAWN AEROBIC AND ANAEROBIC Blood Culture results may not be optimal due to an inadequate volume of blood received in culture bottles   Culture   Final    NO GROWTH 4 DAYS Performed at [facility], [place] [address] [place], [place] [address]    Report Status PENDING  Incomplete  Culture, blood (routine x 2)     Status: None (Preliminary result)   Collection Time: 07/31/20 10:45 AM   Specimen: BLOOD LEFT ARM  Result Value Ref Range Status   Specimen Description BLOOD LEFT ARM  Final   Special Requests   Final    IN PEDIATRIC BOTTLE Blood Culture results may not be optimal due to an excessive volume of blood received in culture bottles   Culture   Final    NO GROWTH 1 DAY Performed at [facility], [place] [address], [place], [place] [address]    Report Status PENDING  Incomplete  Culture, blood (routine x 2)     Status: None (Preliminary result)   Collection Time: 07/31/20 12:44 PM   Specimen: BLOOD RIGHT ARM  Result Value Ref Range  Status   Specimen Description BLOOD RIGHT ARM  Final   Special Requests   Final    BOTTLES DRAWN AEROBIC ONLY Blood Culture results may not be optimal due to an inadequate volume of blood received in culture bottles   Culture   Final    NO GROWTH 1 DAY Performed at [facility], [place] [address], [place], [place] [address]    Report Status PENDING  Incomplete      Studies: No results found.  Scheduled Meds: . sodium chloride   Intravenous Once  . calcitRIOL  2 mcg Oral Q T,Th,Sa-HD  . calcium acetate  1,334 mg Oral With snacks  . calcium acetate  2,668 mg Oral TID WC  . carvedilol  12.5 mg Oral Daily  . carvedilol  12.5 mg Oral Once per day on Sun Mon Wed Fri  . Chlorhexidine Gluconate Cloth  6 each Topical Q0600  . cinacalcet  30 mg Oral Q breakfast  . darbepoetin (ARANESP) injection - DIALYSIS  60 mcg Intravenous Q Tue-HD  . heparin  5,000 Units Subcutaneous Q8H  . hydrOXYzine  10 mg Oral Once  . loperamide  4 mg Oral Once per day on Tue Thu Sat  . multivitamin  1 tablet Oral QHS    Continuous Infusions:    LOS: 2 days     Kayleen Memos, MD [facility] Pager [phone]  If 7PM-7AM, please contact night-coverage [email or web address] Password [email or web address] 08/01/2020, 3:25 PM

## 2020-08-01 NOTE — Discharge Instructions (Signed)
[facility]  Discharge Instructions  AV Fistula or Graft Surgery for Dialysis Access  Please refer to the following instructions for your post-procedure care. Your surgeon or physician assistant will discuss any changes with you.  Activity  You may drive the day following your surgery, if you are comfortable and no longer taking prescription pain medication. Resume full activity as the soreness in your incision resolves.  Bathing/Showering  You may shower after you go home. Keep your incision dry for 48 hours. Do not soak in a bathtub, hot tub, or swim until the incision heals completely. You may not shower if you have a hemodialysis catheter.  Incision Care  Clean your incision with mild soap and water after 48 hours. Pat the area dry with a clean towel. You do not need a bandage unless otherwise instructed. Do not apply any ointments or creams to your incision. You may have skin glue on your incision. Do not peel it off. It will come off on its own in about one week. Your arm may swell a bit after surgery. To reduce swelling use pillows to elevate your arm so it is above your heart. Your doctor will tell you if you need to lightly wrap your arm with an ACE bandage.  Diet  Resume your normal diet. There are not special food restrictions following this procedure. In order to heal from your surgery, it is CRITICAL to get adequate nutrition. Your body requires vitamins, minerals, and protein. Vegetables are the best source of vitamins and minerals. Vegetables also provide the perfect balance of protein. Processed food has little nutritional value, so try to avoid this.  Medications  Resume taking all of your medications. If your incision is causing pain, you may take over-the counter pain relievers such as acetaminophen (Tylenol). If you were prescribed a stronger pain medication, please be aware these medications can cause nausea and constipation. Prevent  nausea by taking the medication with a snack or meal. Avoid constipation by drinking plenty of fluids and eating foods with high amount of fiber, such as fruits, vegetables, and grains.  Do not take Tylenol if you are taking prescription pain medications.  Follow up Your surgeon may want to see you in the office following your access surgery. If so, this will be arranged at the time of your surgery.  Please call us immediately for any of the following conditions:  . Increased pain, redness, drainage (pus) from your incision site . Fever of 101 degrees or higher . Severe or worsening pain at your incision site . Hand pain or numbness. .  Reduce your risk of vascular disease:  . Stop smoking. If you would like help, call [facility] at 1-800-QUIT-NOW (484)544-0824) or [place] at 413 249 9531  . Manage your cholesterol . Maintain a desired weight . Control your diabetes . Keep your blood pressure down  Dialysis  It will take several weeks to several months for your new dialysis access to be ready for use. Your surgeon will determine when it is okay to use it. Your nephrologist will continue to direct your dialysis. You can continue to use your Permcath until your new access is ready for use.   08/01/2020 [name] [id number] 14-Mar-1981  Surgeon(s): Early, Arvilla Meres, MD  Procedure(s): RIGHT ARM BRACHIOCEPHALIC  ARTERIOVENOUS (AV) FISTULA CREATION   May stick graft immediately   May stick graft on designated area only:   X Do not stick right AV fistula for 12 weeks  If you have any questions, please call the office at [phone].

## 2020-08-01 NOTE — Anesthesia Postprocedure Evaluation (Signed)
Anesthesia Post Note  Patient: KEON BENSCOTER  Procedure(s) Performed: RIGHT ARM BRACHIOCEPHALIC  ARTERIOVENOUS (AV) FISTULA CREATION (Right )     Patient location during evaluation: Endoscopy Anesthesia Type: MAC Level of consciousness: awake Pain management: pain level controlled Vital Signs Assessment: post-procedure vital signs reviewed and stable Respiratory status: spontaneous breathing Cardiovascular status: stable Postop Assessment: no apparent nausea or vomiting Anesthetic complications: no   No complications documented.  Last Vitals:  Vitals:   08/01/20 0935 08/01/20 0945  BP: 116/85   Pulse:    Resp: 14   Temp:  37.3 C  SpO2: 100%     Last Pain:  Vitals:   08/01/20 1020  TempSrc:   PainSc: 7                  Calyn Sivils

## 2020-08-01 NOTE — Interval H&P Note (Signed)
History and Physical Interval Note:  08/01/2020 7:19 AM  [name]  has presented today for surgery, with the diagnosis of END STAGE RENAL DISEASE.  The various methods of treatment have been discussed with the patient and family. After consideration of risks, benefits and other options for treatment, the patient has consented to  Procedure(s): RIGHT ARM ARTERIOVENOUS (AV) FISTULA VERSUS ARTERIOVENOUS GORE-TEX GRAFT (Right) as a surgical intervention.  The patient's history has been reviewed, patient examined, no change in status, stable for surgery.  I have reviewed the patient's chart and labs.  Questions were answered to the patient's satisfaction.     Curt Jews

## 2020-08-01 NOTE — Anesthesia Postprocedure Evaluation (Signed)
Anesthesia Post Note  Patient: [name]  Procedure(s) Performed: RIGHT ARM BRACHIOCEPHALIC  ARTERIOVENOUS (AV) FISTULA CREATION (Right )     Patient location during evaluation: Endoscopy Anesthesia Type: MAC Level of consciousness: awake Pain management: pain level controlled Vital Signs Assessment: post-procedure vital signs reviewed and stable Respiratory status: spontaneous breathing Cardiovascular status: stable Postop Assessment: no apparent nausea or vomiting Anesthetic complications: no   No complications documented.  Last Vitals:  Vitals:   08/01/20 0935 08/01/20 0945  BP: 116/85   Pulse:    Resp: 14   Temp:  37.3 C  SpO2: 100%     Last Pain:  Vitals:   08/01/20 1020  TempSrc:   PainSc: 7                  Ashanty Coltrane

## 2020-08-01 NOTE — Anesthesia Postprocedure Evaluation (Signed)
Anesthesia Post Note  Patient: [name]  Procedure(s) Performed: RIGHT ARM BRACHIOCEPHALIC  ARTERIOVENOUS (AV) FISTULA CREATION (Right )     Patient location during evaluation: Endoscopy Anesthesia Type: MAC Level of consciousness: awake Pain management: pain level controlled Vital Signs Assessment: post-procedure vital signs reviewed and stable Respiratory status: spontaneous breathing Cardiovascular status: stable Postop Assessment: no apparent nausea or vomiting Anesthetic complications: no   No complications documented.  Last Vitals:  Vitals:   08/01/20 0935 08/01/20 0945  BP: 116/85   Pulse:    Resp: 14   Temp:  37.3 C  SpO2: 100%     Last Pain:  Vitals:   08/01/20 1020  TempSrc:   PainSc: 7                  Elfego Giammarino     

## 2020-08-01 NOTE — Progress Notes (Signed)
Patient ID: [name], female   DOB: 08-Jul-1981, 39 y.o.   MRN: [id number] S: Feels better and tolerated surgery well O:BP 116/85 (BP Location: Left Wrist)    Pulse 74    Temp 99.2 F (37.3 C)    Resp 14    Ht 5\' 5"  (1.651 m)    Wt 49.9 kg    SpO2 100%    BMI 18.31 kg/m   Intake/Output Summary (Last 24 hours) at 08/01/2020 1054 Last data filed at 08/01/2020 0903 Gross per 24 hour  Intake 540 ml  Output 10 ml  Net 530 ml   Intake/Output: I/O last 3 completed shifts: In: 360 [P.O.:360] Out: -   Intake/Output this shift:  Total I/O In: 300 [I.V.:300] Out: 10 [Blood:10] Weight change:  Gen: NAD CVS:RRR no rub Resp: cta Abd: +BS, soft, NT/ND Ext: no edema, RUE AVF +T/B  Recent Labs  Lab 07/28/20 0420 07/28/20 0440 07/28/20 1022 07/28/20 1055 07/28/20 1630 07/29/20 0342 07/30/20 0717 07/31/20 0826 08/01/20 0712  NA 135   < > 137 138 136 137 131* 132* 144  K >7.5*   < > 3.4* 2.9* 4.5 4.8 5.0 4.4 3.1*  CL 91*   < > 94* 97* 97* 97* 95* 98 110  CO2 19*  --   --   --  21* 27 24 26   --   GLUCOSE 91   < > 333* 129* 158* 133* 110* 84 55*  BUN 137*   < > 55* 54* 62* 77* 88* 31* 28*  CREATININE 18.31*   < > 9.60* 10.00* 10.48* 11.66* 12.82* 7.56* 6.10*  ALBUMIN  --   --   --   --   --  2.1* 2.1* 2.0*  --   CALCIUM 9.2  --   --   --  8.8* 9.1 9.4 9.0  --   PHOS  --   --   --   --   --   --  8.3*  --   --   AST  --   --   --   --   --  28  --  20  --   ALT  --   --   --   --   --  12  --  6  --    < > = values in this interval not displayed.   Liver Function Tests: Recent Labs  Lab 07/29/20 0342 07/30/20 0717 07/31/20 0826  AST 28  --  20  ALT 12  --  6  ALKPHOS 146*  --  114  BILITOT 0.4  --  0.6  PROT 7.0  --  6.9  ALBUMIN 2.1* 2.1* 2.0*   No results for input(s): LIPASE, AMYLASE in the last 168 hours. No results for input(s): AMMONIA in the last 168 hours. CBC: Recent Labs  Lab 07/28/20 0420 07/28/20 0440 07/28/20 1630 07/28/20 1630 07/29/20 0342  07/29/20 0342 07/30/20 0707 07/31/20 0914 08/01/20 0712  WBC 5.4   < > 3.9*   < > 3.7*  --  7.6 5.6  --   NEUTROABS 3.3  --   --   --  2.1  --   --  4.0  --   HGB 10.3*   < > 9.1*   < > 8.7*   < > 8.4* 8.9* 7.5*  HCT 34.9*   < > 30.7*   < > 28.6*   < > 28.2* 28.9* 22.0*  MCV 89.0  --  88.2  --  88.8  --  89.2 87.8  --   PLT 269   < > 222   < > 242  --  260 244  --    < > = values in this interval not displayed.   Cardiac Enzymes: No results for input(s): CKTOTAL, CKMB, CKMBINDEX, TROPONINI in the last 168 hours. CBG: Recent Labs  Lab 07/28/20 0516 07/28/20 1151 07/28/20 1301  GLUCAP 113* 79 77    Iron Studies: No results for input(s): IRON, TIBC, TRANSFERRIN, FERRITIN in the last 72 hours. Studies/Results: DG CHEST PORT 1 VIEW  Result Date: 07/31/2020 CLINICAL DATA:  Fever, shortness of breath. EXAM: PORTABLE CHEST 1 VIEW COMPARISON:  July 28, 2020. FINDINGS: Stable cardiomegaly. Left-sided dialysis catheter is unchanged in position. No pneumothorax is noted. Stable left basilar subsegmental atelectasis is noted with probable small left pleural effusion. Stable mild reticular and interstitial densities are noted throughout both lungs which may represent chronic scarring, but acute superimposed edema or inflammation cannot be excluded. Bony thorax is unremarkable. IMPRESSION: Stable mild reticular and interstitial densities are noted throughout both lungs which may represent chronic scarring, but acute superimposed edema or inflammation cannot be excluded. Electronically Signed   By: Marijo Conception M.D.   On: 07/31/2020 08:35    sodium chloride   Intravenous Once   calcitRIOL  2 mcg Oral Q T,Th,Sa-HD   calcium acetate  1,334 mg Oral With snacks   calcium acetate  2,668 mg Oral TID WC   carvedilol  12.5 mg Oral Daily   carvedilol  12.5 mg Oral Once per day on Sun Mon Wed Fri   Chlorhexidine Gluconate Cloth  6 each Topical Q0600   cinacalcet  30 mg Oral Q breakfast    darbepoetin (ARANESP) injection - DIALYSIS  60 mcg Intravenous Q Tue-HD   heparin  5,000 Units Subcutaneous Q8H   hydrOXYzine  10 mg Oral Once   loperamide  4 mg Oral Once per day on Tue Thu Sat   multivitamin  1 tablet Oral QHS    BMET    Component Value Date/Time   NA 144 08/01/2020 0712   K 3.1 (L) 08/01/2020 0712   CL 110 08/01/2020 0712   CO2 26 07/31/2020 0826   GLUCOSE 55 (L) 08/01/2020 0712   BUN 28 (H) 08/01/2020 0712   CREATININE 6.10 (H) 08/01/2020 0712   CALCIUM 9.0 07/31/2020 0826   CALCIUM 6.7 (L) 01/31/2014 1309   GFRNONAA 6 (L) 07/31/2020 0826   GFRAA 7 (L) 07/31/2020 0826   CBC    Component Value Date/Time   WBC 5.6 07/31/2020 0914   RBC 3.29 (L) 07/31/2020 0914   HGB 7.5 (L) 08/01/2020 0712   HCT 22.0 (L) 08/01/2020 0712   PLT 244 07/31/2020 0914   MCV 87.8 07/31/2020 0914   MCH 27.1 07/31/2020 0914   MCHC 30.8 07/31/2020 0914   RDW 18.9 (H) 07/31/2020 0914   LYMPHSABS 0.6 (L) 07/31/2020 0914   MONOABS 0.9 07/31/2020 0914   EOSABS 0.1 07/31/2020 0914   BASOSABS 0.0 07/31/2020 0914   Patient dialyzes at [facility] TTS. Outpatient orders: F180, 3 hrs 28min. 2K, 2Ca, 137Na, 35Bicarb. BFR 400 via LIJ TDC. EDW 47kg. Calcitriol 63mcg qtreatment. Mircera 171mcg q2weeks  Assessment/Plan:  1. Vascular access- s/p ligation of LUE AVF. Using LIJ TDC.  1. S/p RUE first stage brachiobasilic AFF by Dr. Donnetta Hutching today 2. Hyperkalemia due to noncompliance with HD. Resolved with HD. 3. ESRD- continue with HD on TTS schedule. Stressed importance  of compliance 4. 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 7. Disposition- possible discharge after HD today.  Donetta Potts, MD [employer] [phone]

## 2020-08-01 NOTE — Transfer of Care (Signed)
Immediate Anesthesia Transfer of Care Note  Patient: [name]  Procedure(s) Performed: RIGHT ARM BRACHIOCEPHALIC  ARTERIOVENOUS (AV) FISTULA CREATION (Right )  Patient Location: PACU  Anesthesia Type:MAC  Level of Consciousness: drowsy, patient cooperative and responds to stimulation  Airway & Oxygen Therapy: Patient Spontanous Breathing  Post-op Assessment: Report given to RN and Post -op Vital signs reviewed and stable  Post vital signs: Reviewed and stable  Last Vitals:  Vitals Value Taken Time  BP 115/55 08/01/20 0907  Temp    Pulse 81 08/01/20 0908  Resp 27 08/01/20 0908  SpO2 100 % 08/01/20 0908  Vitals shown include unvalidated device data.  Last Pain:  Vitals:   08/01/20 0507  TempSrc: Oral  PainSc:       Patients Stated Pain Goal: 0 (08/10/87 7195)  Complications: No complications documented.

## 2020-08-01 NOTE — Op Note (Signed)
    OPERATIVE REPORT  DATE OF SURGERY: 08/01/2020  PATIENT: [name], 39 y.o. female MRN: [id number]  DOB: 11-24-1981  PRE-OPERATIVE DIAGNOSIS: End-stage renal disease  POST-OPERATIVE DIAGNOSIS:  Same  PROCEDURE: Right first stage brachiobasilic AV fistula creation  SURGEON:  Curt Jews, M.D.  PHYSICIAN ASSISTANT: Baglia PAC  The assistant was needed for exposure and to expedite the case  ANESTHESIA: Local with sedation  EBL: per anesthesia record  Total I/O In: 300 [I.V.:300] Out: 10 [Blood:10]  BLOOD ADMINISTERED: none  DRAINS: none  SPECIMEN: none  COUNTS CORRECT:  YES  PATIENT DISPOSITION:  PACU - hemodynamically stable  PROCEDURE DETAILS: The patient was taken to the operating room placed supine position where the area of the right arm prepped draped in sterile fashion.  SonoSite ultrasound was used to visualize the veins.  The patient did have a good caliber basilic vein.  This coursed to the level of the artery alongside the brachial vein.  Local anesthesia was used to make an incision longitudinally over the arterial pulse at the antecubital space.  The artery was of good caliber.  This was encircled with a vessel loop.  Tributary branches of the vein were ligated and divided.  The vein was ligated distally and divided and was gently dilated.  The artery was occluded proximally distally and was opened with 11 blade similarly with Potts scissors.  The vein was sewn end-to-side to the artery with a running 6-0 Prolene suture.  Clamps removed and excellent thrill was noted.  Patient maintained a right radial pulse although this was diminished with the graft open.  Wounds irrigated with saline.  Hemostasis electrocautery.  Wounds were closed with 3-0 Vicryl in the subcutaneous subcuticular tissue.  Sterile dressing was applied and the patient was transferred to the recovery room stable condition   [name], M.D., [facility] 08/01/2020 9:12 AM

## 2020-08-02 ENCOUNTER — Encounter (HOSPITAL_COMMUNITY): Payer: Self-pay | Admitting: Vascular Surgery

## 2020-08-02 LAB — BASIC METABOLIC PANEL
Anion gap: 13 (ref 5–15)
BUN: 31 mg/dL — ABNORMAL HIGH (ref 6–20)
CO2: 27 mmol/L (ref 22–32)
Calcium: 8.9 mg/dL (ref 8.9–10.3)
Chloride: 96 mmol/L — ABNORMAL LOW (ref 98–111)
Creatinine, Ser: 6.56 mg/dL — ABNORMAL HIGH (ref 0.44–1.00)
GFR calc Af Amer: 9 mL/min — ABNORMAL LOW (ref >60)
GFR calc non Af Amer: 7 mL/min — ABNORMAL LOW (ref >60)
Glucose, Bld: 74 mg/dL (ref 70–99)
Potassium: 4.2 mmol/L (ref 3.5–5.1)
Sodium: 136 mmol/L (ref 135–145)

## 2020-08-02 LAB — CBC WITH DIFFERENTIAL/PLATELET
Abs Immature Granulocytes: 0.03 10*3/uL (ref 0.00–0.07)
Basophils Absolute: 0 10*3/uL (ref 0.0–0.1)
Basophils Relative: 0 %
Eosinophils Absolute: 0.1 10*3/uL (ref 0.0–0.5)
Eosinophils Relative: 2 %
HCT: 26.6 % — ABNORMAL LOW (ref 36.0–46.0)
Hemoglobin: 7.8 g/dL — ABNORMAL LOW (ref 12.0–15.0)
Immature Granulocytes: 1 %
Lymphocytes Relative: 8 %
Lymphs Abs: 0.4 10*3/uL — ABNORMAL LOW (ref 0.7–4.0)
MCH: 25.8 pg — ABNORMAL LOW (ref 26.0–34.0)
MCHC: 29.3 g/dL — ABNORMAL LOW (ref 30.0–36.0)
MCV: 88.1 fL (ref 80.0–100.0)
Monocytes Absolute: 1 10*3/uL (ref 0.1–1.0)
Monocytes Relative: 18 %
Neutro Abs: 3.9 10*3/uL (ref 1.7–7.7)
Neutrophils Relative %: 71 %
Platelets: 263 10*3/uL (ref 150–400)
RBC: 3.02 MIL/uL — ABNORMAL LOW (ref 3.87–5.11)
RDW: 18.7 % — ABNORMAL HIGH (ref 11.5–15.5)
WBC: 5.5 10*3/uL (ref 4.0–10.5)
nRBC: 0 % (ref 0.0–0.2)

## 2020-08-02 LAB — CULTURE, BLOOD (ROUTINE X 2)
Culture: NO GROWTH
Culture: NO GROWTH

## 2020-08-02 LAB — LACTIC ACID, PLASMA: Lactic Acid, Venous: 0.7 mmol/L (ref 0.5–1.9)

## 2020-08-02 LAB — PROCALCITONIN: Procalcitonin: 2.71 ng/mL

## 2020-08-02 MED ORDER — CAMPHOR-MENTHOL 0.5-0.5 % EX LOTN
TOPICAL_LOTION | CUTANEOUS | Status: DC | PRN
  Filled 2020-08-02: qty 222

## 2020-08-02 MED ORDER — CALCITRIOL 0.5 MCG PO CAPS: 2.0000 ug | ORAL_CAPSULE | ORAL | 0 refills | Status: AC

## 2020-08-02 MED ORDER — RENA-VITE PO TABS: 1.0000 | ORAL_TABLET | Freq: Every day | ORAL | 0 refills | Status: AC

## 2020-08-02 MED ORDER — DOXYCYCLINE HYCLATE 100 MG PO TABS: 100.0000 mg | ORAL_TABLET | Freq: Two times a day (BID) | ORAL | Status: DC

## 2020-08-02 MED ORDER — OXYCODONE-ACETAMINOPHEN 5-325 MG PO TABS: 1.0000 | ORAL_TABLET | Freq: Two times a day (BID) | ORAL | 0 refills | Status: AC | PRN

## 2020-08-02 MED ORDER — DOXYCYCLINE HYCLATE 100 MG PO TABS: 100.0000 mg | ORAL_TABLET | Freq: Every day | ORAL | 0 refills | Status: AC

## 2020-08-02 MED ORDER — CAMPHOR-MENTHOL 0.5-0.5 % EX LOTN: TOPICAL_LOTION | CUTANEOUS | 0 refills | Status: DC | PRN

## 2020-08-02 MED ORDER — SACCHAROMYCES BOULARDII 250 MG PO CAPS: 250.0000 mg | ORAL_CAPSULE | Freq: Two times a day (BID) | ORAL | Status: DC

## 2020-08-02 MED ORDER — SACCHAROMYCES BOULARDII 250 MG PO CAPS: 250.0000 mg | ORAL_CAPSULE | Freq: Two times a day (BID) | ORAL | 0 refills | Status: AC

## 2020-08-02 NOTE — Progress Notes (Signed)
Patient ID: [name], female   DOB: 1981-09-05, 39 y.o.   MRN: [id number] S: Feels better and wants to go home O:BP (!) 161/100 (BP Location: Left Leg)   Pulse 78   Temp 98.4 F (36.9 C) (Oral)   Resp 18   Ht 5\' 5"  (1.651 m)   Wt 47.6 kg   SpO2 91%   BMI 17.46 kg/m   Intake/Output Summary (Last 24 hours) at 08/02/2020 9509 Last data filed at 08/01/2020 2000 Gross per 24 hour  Intake 240 ml  Output 2000 ml  Net -1760 ml   Intake/Output: I/O last 3 completed shifts: In: 540 [P.O.:240; I.V.:300] Out: 2010 [Other:2000; Blood:10]  Intake/Output this shift:  No intake/output data recorded. Weight change:  Gen: NAD CVS: no rub Resp: cta Abd: benign Ext: no edema, RUE AVF +T/B, LUE incisions intact, no drainage  Recent Labs  Lab 07/28/20 0420 07/28/20 0440 07/28/20 1022 07/28/20 1055 07/28/20 1630 07/29/20 0342 07/30/20 0717 07/31/20 0826 08/01/20 0712  NA 135   < > 137 138 136 137 131* 132* 144  K >7.5*   < > 3.4* 2.9* 4.5 4.8 5.0 4.4 3.1*  CL 91*   < > 94* 97* 97* 97* 95* 98 110  CO2 19*  --   --   --  21* 27 24 26   --   GLUCOSE 91   < > 333* 129* 158* 133* 110* 84 55*  BUN 137*   < > 55* 54* 62* 77* 88* 31* 28*  CREATININE 18.31*   < > 9.60* 10.00* 10.48* 11.66* 12.82* 7.56* 6.10*  ALBUMIN  --   --   --   --   --  2.1* 2.1* 2.0*  --   CALCIUM 9.2  --   --   --  8.8* 9.1 9.4 9.0  --   PHOS  --   --   --   --   --   --  8.3*  --   --   AST  --   --   --   --   --  28  --  20  --   ALT  --   --   --   --   --  12  --  6  --    < > = values in this interval not displayed.   Liver Function Tests: Recent Labs  Lab 07/29/20 0342 07/30/20 0717 07/31/20 0826  AST 28  --  20  ALT 12  --  6  ALKPHOS 146*  --  114  BILITOT 0.4  --  0.6  PROT 7.0  --  6.9  ALBUMIN 2.1* 2.1* 2.0*   No results for input(s): LIPASE, AMYLASE in the last 168 hours. No results for input(s): AMMONIA in the last 168 hours. CBC: Recent Labs  Lab 07/28/20 0420 07/28/20 0440  07/28/20 1630 07/28/20 1630 07/29/20 0342 07/29/20 0342 07/30/20 0707 07/30/20 0707 07/31/20 0914 08/01/20 0712 08/01/20 1221  WBC 5.4   < > 3.9*   < > 3.7*   < > 7.6  --  5.6  --  6.2  NEUTROABS 3.3  --   --   --  2.1  --   --   --  4.0  --   --   HGB 10.3*   < > 9.1*   < > 8.7*   < > 8.4*   < > 8.9* 7.5* 8.1*  HCT 34.9*   < > 30.7*   < >  28.6*   < > 28.2*   < > 28.9* 22.0* 27.5*  MCV 89.0   < > 88.2  --  88.8  --  89.2  --  87.8  --  88.7  PLT 269   < > 222   < > 242   < > 260  --  244  --  274   < > = values in this interval not displayed.   Cardiac Enzymes: No results for input(s): CKTOTAL, CKMB, CKMBINDEX, TROPONINI in the last 168 hours. CBG: Recent Labs  Lab 07/28/20 0516 07/28/20 1151 07/28/20 1301  GLUCAP 113* 79 77    Iron Studies: No results for input(s): IRON, TIBC, TRANSFERRIN, FERRITIN in the last 72 hours. Studies/Results: No results found. . sodium chloride   Intravenous Once  . calcitRIOL  2 mcg Oral Q T,Th,Sa-HD  . calcium acetate  1,334 mg Oral With snacks  . calcium acetate  2,668 mg Oral TID WC  . carvedilol  12.5 mg Oral Daily  . carvedilol  12.5 mg Oral Once per day on Sun Mon Wed Fri  . Chlorhexidine Gluconate Cloth  6 each Topical Q0600  . cinacalcet  30 mg Oral Q breakfast  . darbepoetin (ARANESP) injection - DIALYSIS  60 mcg Intravenous Q Tue-HD  . heparin  5,000 Units Subcutaneous Q8H  . hydrOXYzine  10 mg Oral Once  . loperamide  4 mg Oral Once per day on Tue Thu Sat  . multivitamin  1 tablet Oral QHS    BMET    Component Value Date/Time   NA 144 08/01/2020 0712   K 3.1 (L) 08/01/2020 0712   CL 110 08/01/2020 0712   CO2 26 07/31/2020 0826   GLUCOSE 55 (L) 08/01/2020 0712   BUN 28 (H) 08/01/2020 0712   CREATININE 6.10 (H) 08/01/2020 0712   CALCIUM 9.0 07/31/2020 0826   CALCIUM 6.7 (L) 01/31/2014 1309   GFRNONAA 6 (L) 07/31/2020 0826   GFRAA 7 (L) 07/31/2020 0826   CBC    Component Value Date/Time   WBC 6.2 08/01/2020 1221    RBC 3.10 (L) 08/01/2020 1221   HGB 8.1 (L) 08/01/2020 1221   HCT 27.5 (L) 08/01/2020 1221   PLT 274 08/01/2020 1221   MCV 88.7 08/01/2020 1221   MCH 26.1 08/01/2020 1221   MCHC 29.5 (L) 08/01/2020 1221   RDW 18.7 (H) 08/01/2020 1221   LYMPHSABS 0.6 (L) 07/31/2020 0914   MONOABS 0.9 07/31/2020 0914   EOSABS 0.1 07/31/2020 0914   BASOSABS 0.0 07/31/2020 0914    Patient dialyzes at [facility] TTS. Outpatient orders: F180, 3 hrs 36min. 2K, 2Ca, 137Na, 35Bicarb. BFR 400 via LIJ TDC. EDW 47kg. Calcitriol 8mcg qtreatment. Mircera 186mcg q2weeks  Assessment/Plan:  1. Vascular access- s/p ligation of LUE AVF. Using LIJ TDC.  1. S/p RUE first stage brachiobasilic AFF by Dr. Donnetta Hutching 08/01/20. 2. Hyperkalemia due to noncompliance with HD.Resolved with HD. 3. ESRD- continue with HD on TTS schedule. Stressed importance of compliance 4. 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 7. Disposition- stable for discharge from renal and vascular standpoint.  F/u with outpatient HD tomorrow.  Donetta Potts, MD [employer] [phone]

## 2020-08-02 NOTE — Discharge Summary (Signed)
Discharge Summary  [name] FTD:[id number] DOB: Jul 05, 1981  PCP: [name], No Pcp Per  Admit date: 07/28/2020 Discharge date: 08/02/2020  Time spent: 35 minutes   Recommendations for Outpatient Follow-up:  1. Follow-up with vascular surgery 2. Follow-up with nephrology 3. Follow-up with your primary care provider 4. Take your medications as prescribed  Discharge Diagnoses:  Active Hospital Problems   Diagnosis Date Noted  . Acute hyperkalemia 07/28/2020  . Nicotine dependence, cigarettes, uncomplicated 06/20/7627  . Essential hypertension 07/28/2020  . Polysubstance abuse ([place]) 07/28/2020  . Hemodialysis catheter malfunction ([place]) 07/28/2020  . Complication of AV dialysis fistula, sequela 07/28/2020  . ESRD (end stage renal disease) on dialysis ([place]) 02/17/2017  . Chronic combined systolic (congestive) and diastolic (congestive) heart failure ([place]) 03/16/2016  . SLE (systemic lupus erythematosus) ([place]) 01/17/2013    Resolved Hospital Problems  No resolved problems to display.    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  Admitted early this AM with severe hyperkalemia after missing sessions of hemodialysis. Urgently hemodialysis for K+ 7.4. Bleeding from fistula needing intervention. Vascular surgery taken for oversewing of  left arm brachial cephalic fistula on 04/28/75 unable to save fistula, post ligation of left brachial cephalic AV fistula.  HD 8/3.  Plan for R AV fistula or AV graft on Thursday by vascular surgery.  T-max 101.5 on 07/31/20. Blood cultures obtained, negative to date.  Procalcitonin greater than 4.0.   Chest x-ray no evidence of pneumonia.  Vital signs are stable, no leukocytosis.  Held off antibiotics at that time.  Had a recurrent fever on 08/01/2020 with T-max of 101.2.  Procalcitonin 2.2.    08/02/20: Reported pimples, inflamed regions on her upper extremities bilaterally.  Possibly mild infection to the left forearm skin. Will treat with po doxycline 100 mg BID x 7 days.    Hospital Course:  Principal Problem:   Acute hyperkalemia Active Problems:   SLE (systemic lupus erythematosus) ([facility])   Chronic combined systolic (congestive) and diastolic (congestive) heart failure ([facility])   ESRD (end stage renal disease) on dialysis ([facility])   Nicotine dependence, cigarettes, uncomplicated   Essential hypertension   Polysubstance abuse ([place])   Hemodialysis catheter malfunction ([place])   Complication of AV dialysis fistula, sequela  Resolved Severe hyperkalemia secondary to missing 2 hemodialysis sessions in the setting of ESRD HD TTS Urgently taken for dialysis on 07/28/20 Hyperkalemia has resolved post hemodialysis Bleeding from fistula needing intervention. Vascular surgery taken for oversewing of left arm brachial cephalic fistula on 01/02/06 unable to save fistula, ligated. Completed venous mapping by vascular surgery with plan for R AV fistula or AV graft, completed on Thursday by Dr. Donnetta Hutching, vascular surgery. Please follow up with vascular surgery  Fever, unclear etiology, suspect 2/2 to left forearm skin infection. Febrile 8/4 with T-max 101.5. elevated procalcitonin >4.0  Febrile 8/5 Tmax 101.2 Procalcitonin >2.0 Blood cx negative to date Reported pimples, inflamed regions on her upper  extremities  bilaterally.  Possibly mild infection to the left forearm skin. Will treat with po doxycline 100 mg BID x 7 days. Add Florastor 250 mg BID x 7 days Follow up with your PCP  ESRD on HD TTS Noncompliance Nephrology following Vascular surgery following for hemodialysis access. Post oversewing of left AV fistula on 07/28/2020, unable to save. Post vein mapping today by vascular surgery on 07/29/20. Post R arm brachiocephalic AV fistula creation by Dr. Donnetta Hutching vascular surgery on 08/01/20. HD completed on 08/01/20. Appreciate specialists assistance.  Resolved euvolemic hyponatremia  Serum Na+ 131> 144> 136 Suspect from hemodilution, in the setting of ESRD Volume status and electrolytes addressed with HD  Resolved Hypokalemia Serum potassium 3.1> 4.2 Managed by hemodialysis  Chronic combined systolic and diastolic CHF Last 2D echo done on 02/17/2017 showed LVEF 40 to 45% and grade 2 diastolic dysfunction Euvolemic on exam Volume status addressed with hemodialysis Currently on Coreg Follow up with your cardiologist  SLE Not currently on immunomodulating therapy  Pruritus in the setting of ESRD Continue home Hydroxyzine Start Sarna as needed    Code Status: Full code  Family Communication: None at bedside   Consultants:  Nephrology  Vascular surgery  Procedures: -Oversewing of left AV fistula -Post vein mapping today by vascular surgery on 07/29/20. -Post R arm brachiocephalic AV fistula creation by Dr. Donnetta Hutching vascular surgery on 08/01/20 -HD sessions, last HD completed on 08/01/20.    Discharge Exam: BP (!) 161/100 (BP Location: Left Leg)   Pulse 78   Temp 98.4 F (36.9 C) (Oral)   Resp 18   Ht 5\' 5"  (1.651 m)   Wt 47.6 kg   SpO2 91%   BMI 17.46 kg/m  . General: 39 y.o. year-old female well developed well nourished in no acute distress.  Alert and oriented x3. . Cardiovascular: Regular rate and rhythm with no rubs or gallops.  No thyromegaly or JVD noted.     Marland Kitchen Respiratory: Clear to auscultation with no wheezes or rales. Good inspiratory effort. . Abdomen: Soft nontender nondistended with normal bowel sounds x4 quadrants. . Musculoskeletal: No lower extremity edema. New R fistula. Marland Kitchen Psychiatry: Mood is appropriate for condition and setting  Discharge Instructions You were cared for by a hospitalist during your hospital stay. If you have any questions about your discharge medications or the care you received while you were in the hospital after you are discharged, you can call the unit and asked to speak with the hospitalist on call if the hospitalist that took care of you is not available. Once you are discharged, your primary care physician will handle any further medical issues. Please note that NO REFILLS for any discharge medications will be authorized once you are discharged, as it is imperative that you return to your primary care physician (or establish a relationship with a primary care physician if you do not have one) for your aftercare needs so that they can reassess your need for medications and monitor your lab values.   Allergies as of 08/02/2020      Reactions   Tobramycin Sulfate Swelling   Eye swelling      Medication List    STOP taking these medications   diltiazem 30 MG tablet Commonly known as: CARDIZEM     TAKE these medications   acetaminophen 500 MG tablet Commonly known as: TYLENOL Take 500 mg by mouth every 6 (six) hours as needed for mild pain.   calcitRIOL 0.5 MCG capsule Commonly known as: ROCALTROL  Take 4 capsules (2 mcg total) by mouth Every Tuesday,Thursday,and Saturday with dialysis. Start taking on: August 03, 2020   calcium acetate 667 MG capsule Commonly known as: PHOSLO Take [phone] mg by mouth See admin instructions. Take 4 capsule (2668 mg) by mouth three times a day with each meal and 2 capsules (1334 mg) with each snack   camphor-menthol lotion Commonly known as: SARNA Apply topically as  needed for itching.   carvedilol 12.5 MG tablet Commonly known as: COREG Take 12.5 mg by mouth 2 (two) times daily. Take one tablet (12.5 mg) by mouth every morning, take one tablet (12.5 mg) at night on Sunday, Monday, Wednesday, Friday   cinacalcet 30 MG tablet Commonly known as: SENSIPAR Take 30 mg by mouth daily.   diphenhydrAMINE 25 MG tablet Commonly known as: BENADRYL Take 25 mg by mouth at bedtime.   doxycycline 100 MG tablet Commonly known as: VIBRA-TABS Take 1 tablet (100 mg total) by mouth daily for 7 days.   heparin 1000 unit/mL Soln injection 1,000 Units by Dialysis route one time in dialysis.   hydrOXYzine 10 MG tablet Commonly known as: ATARAX/VISTARIL Take 1 tablet (10 mg total) by mouth 3 (three) times daily as needed for anxiety. What changed: reasons to take this   loperamide 2 MG tablet Commonly known as: IMODIUM A-D Take 4 mg by mouth daily.   MIRCERA IJ Inject 1 Dose as directed every 14 (fourteen) days.   multivitamin Tabs tablet Take 1 tablet by mouth at bedtime. What changed:   when to take this  Another medication with the same name was removed. Continue taking this medication, and follow the directions you see here.   mupirocin ointment 2 % Commonly known as: BACTROBAN Apply 1 application topically daily.   Nova[employer] Renal Liqd Take 237 mLs by mouth See admin instructions. Take 1 container (237 mls) by at bedtime on dialysis days (Tuesday, Thursday, Saturday)   oxyCODONE-acetaminophen 5-325 MG tablet Commonly known as: PERCOCET/ROXICET Take 1 tablet by mouth 2 (two) times daily as needed for up to 5 days for severe pain.   saccharomyces boulardii 250 MG capsule Commonly known as: FLORASTOR Take 1 capsule (250 mg total) by mouth 2 (two) times daily for 7 days.      Allergies  Allergen Reactions  . Tobramycin Sulfate Swelling    Eye swelling    Follow-up Information    Early, Arvilla Meres, MD.   Specialties: Vascular Surgery,  Cardiology Why: 4-6 weeks. The office will call the [name] with an appointment Contact information: [place] [address] [phone]        [facility]. Call in 1 day(s).   Why: Please call for a post hospital follow up appointment. Contact information: [address] [place] [address] [phone]               The results of significant diagnostics from this hospitalization (including imaging, microbiology, ancillary and laboratory) are listed below for reference.    Significant Diagnostic Studies: DG Chest 2 View  Result Date: 07/28/2020 CLINICAL DATA:  Chest pain while laying flat. EXAM: CHEST - 2 VIEW COMPARISON:  05/19/2020 FINDINGS: Moderate enlargement of the cardiopericardial silhouette. No mediastinal or hilar masses or evidence of adenopathy. Lungs are hyperexpanded. Mild stable interstitial thickening. No evidence of pneumonia or pulmonary edema. No pleural effusion or pneumothorax. Left internal jugular tunneled dual lumen central venous catheter is stable, tip in the lower superior vena cava.  Skeletal structures are intact. IMPRESSION: 1. No acute cardiopulmonary disease. 2. Stable cardiomegaly. Electronically Signed   By: Lajean Manes M.D.   On: 07/28/2020 04:34   DG CHEST PORT 1 VIEW  Result Date: 07/31/2020 CLINICAL DATA:  Fever, shortness of breath. EXAM: PORTABLE CHEST 1 VIEW COMPARISON:  July 28, 2020. FINDINGS: Stable cardiomegaly. Left-sided dialysis catheter is unchanged in position. No pneumothorax is noted. Stable left basilar subsegmental atelectasis is noted with probable small left pleural effusion. Stable mild reticular and interstitial densities are noted throughout both lungs which may represent chronic scarring, but acute superimposed edema or inflammation cannot be excluded. Bony thorax is unremarkable. IMPRESSION: Stable mild reticular and interstitial densities are noted throughout  both lungs which may represent chronic scarring, but acute superimposed edema or inflammation cannot be excluded. Electronically Signed   By: Marijo Conception M.D.   On: 07/31/2020 08:35   VAS [name] UPPER EXTREMITY ARTERIAL DUPLEX  Result Date: 07/30/2020 UPPER EXTREMITY DUPLEX STUDY Indications: [name] complains of Pre Dialysis access.  Risk Factors:  Hypertension. Other Factors: Lupus, CHF, polysubstance abuse, non compliant with dialysis,                failed left dialysis access Limitations: IV at wrist Comparison Study: No prior study on file Performing Technologist: Sharion Dove RVS  Examination Guidelines: A complete evaluation includes B-mode imaging, spectral Doppler, color Doppler, and power Doppler as needed of all accessible portions of each vessel. Bilateral testing is considered an integral part of a complete examination. Limited examinations for reoccurring indications may be performed as noted.  Right Doppler Findings: +---------------+----------+---------+--------+--------+ Site           PSV (cm/s)Waveform StenosisComments +---------------+----------+---------+--------+--------+ Subclavian Dist                                    +---------------+----------+---------+--------+--------+ Brachial Dist  68        triphasic                 +---------------+----------+---------+--------+--------+ Radial Dist    90        triphasic                 +---------------+----------+---------+--------+--------+ Ulnar Dist     72        triphasic                 +---------------+----------+---------+--------+--------+  Right Pre-Dialysis Findings: +-----------------------+----------+--------------------+---------+--------+ Location               PSV (cm/s)Intralum. Diam. (cm)Waveform Comments +-----------------------+----------+--------------------+---------+--------+ Brachial Antecub. fossa68        0.46                triphasic          +-----------------------+----------+--------------------+---------+--------+ Radial Art at Wrist    90        0.24                triphasic         +-----------------------+----------+--------------------+---------+--------+ Ulnar Art at Wrist     72        0.17                triphasic         +-----------------------+----------+--------------------+---------+--------+  Electronically signed by Deitra Mayo MD on 07/30/2020 at 8:11:40 PM.    Final    VAS [name] UPPER EXT VEIN MAPPING (PRE-OP AVF)  Result Date: 07/29/2020 UPPER  EXTREMITY VEIN MAPPING  Indications: Pre-access. History: Lupus, CHF, Hypertension, polysubstance abuse, non compliant with          dialysis, failed left dialysis access.  Limitations: IV at right wrist. Comparison Study: No prior study on file Performing Technologist: Sharion Dove RVS  Examination Guidelines: A complete evaluation includes B-mode imaging, spectral Doppler, color Doppler, and power Doppler as needed of all accessible portions of each vessel. Bilateral testing is considered an integral part of a complete examination. Limited examinations for reoccurring indications may be performed as noted. +-----------------+-------------+----------+----------------+ Right Cephalic   Diameter (cm)Depth (cm)    Findings     +-----------------+-------------+----------+----------------+ Prox upper arm       0.23        0.17                    +-----------------+-------------+----------+----------------+ Mid upper arm        0.21        0.14      branching     +-----------------+-------------+----------+----------------+ Dist upper arm       0.23        0.12      branching     +-----------------+-------------+----------+----------------+ Antecubital fossa    0.18        0.14      branching     +-----------------+-------------+----------+----------------+ Prox forearm         0.16        0.11      branching      +-----------------+-------------+----------+----------------+ Mid forearm          0.14        0.13                    +-----------------+-------------+----------+----------------+ Wrist                1.51        0.10   branching and IV +-----------------+-------------+----------+----------------+ +-----------------+-------------+----------+---------+ Right Basilic    Diameter (cm)Depth (cm)Findings  +-----------------+-------------+----------+---------+ Mid upper arm        0.48        0.61    origin   +-----------------+-------------+----------+---------+ Dist upper arm       0.26        0.61             +-----------------+-------------+----------+---------+ Antecubital fossa    0.25        0.30             +-----------------+-------------+----------+---------+ Prox forearm         0.18        0.25             +-----------------+-------------+----------+---------+ Mid forearm          0.23        0.26   branching +-----------------+-------------+----------+---------+ Distal forearm       0.22        0.33             +-----------------+-------------+----------+---------+ Wrist                0.10        0.16             +-----------------+-------------+----------+---------+ *See table(s) above for measurements and observations.  Diagnosing physician: Ruta Hinds MD Electronically signed by Ruta Hinds MD on 07/29/2020 at 3:50:03 PM.    Final     Microbiology: Recent Results (from the past 240 hour(s))  SARS Coronavirus 2 by RT PCR (  hospital order, performed in [facility] hospital lab) Nasopharyngeal Nasopharyngeal Swab     Status: None   Collection Time: 07/28/20  4:43 AM   Specimen: Nasopharyngeal Swab  Result Value Ref Range Status   SARS Coronavirus 2 NEGATIVE NEGATIVE Final    Comment: (NOTE) SARS-CoV-2 target nucleic acids are NOT DETECTED.  The SARS-CoV-2 RNA is generally detectable in upper and lower respiratory specimens during the acute  phase of infection. The lowest concentration of SARS-CoV-2 viral copies this assay can detect is 250 copies / mL. A negative result does not preclude SARS-CoV-2 infection and should not be used as the sole basis for treatment or other [name] management decisions.  A negative result may occur with improper specimen collection / handling, submission of specimen other than nasopharyngeal swab, presence of viral mutation(s) within the areas targeted by this assay, and inadequate number of viral copies (<250 copies / mL). A negative result must be combined with clinical observations, [name] history, and epidemiological information.  Fact Sheet for Patients:   StrictlyIdeas.no  Fact Sheet for Healthcare Providers: BankingDealers.co.za  This test is not yet approved or  cleared by the [place] FDA and has been authorized for detection and/or diagnosis of SARS-CoV-2 by FDA under an Emergency Use Authorization (EUA).  This EUA will remain in effect (meaning this test can be used) for the duration of the COVID-19 declaration under Section 564(b)(1) of the Act, 21 U.S.C. section 360bbb-3(b)(1), unless the authorization is terminated or revoked sooner.  Performed at [facility], [place] [address] [place], [place] [address]   Blood culture (routine x 2)     Status: None (Preliminary result)   Collection Time: 07/28/20  6:50 AM   Specimen: BLOOD LEFT WRIST  Result Value Ref Range Status   Specimen Description BLOOD LEFT WRIST  Final   Special Requests   Final    BOTTLES DRAWN AEROBIC AND ANAEROBIC Blood Culture results may not be optimal due to an inadequate volume of blood received in culture bottles   Culture   Final    NO GROWTH 4 DAYS Performed at [facility], [place] [address], [place], [place] [address]    Report Status PENDING  Incomplete  Blood culture (routine x 2)     Status: None (Preliminary result)   Collection  Time: 07/28/20  6:57 AM   Specimen: BLOOD LEFT HAND  Result Value Ref Range Status   Specimen Description BLOOD LEFT HAND  Final   Special Requests   Final    BOTTLES DRAWN AEROBIC AND ANAEROBIC Blood Culture results may not be optimal due to an inadequate volume of blood received in culture bottles   Culture   Final    NO GROWTH 4 DAYS Performed at [facility], [place] [address] [place], [place] [address]    Report Status PENDING  Incomplete  Culture, blood (routine x 2)     Status: None (Preliminary result)   Collection Time: 07/31/20 10:45 AM   Specimen: BLOOD LEFT ARM  Result Value Ref Range Status   Specimen Description BLOOD LEFT ARM  Final   Special Requests   Final    IN PEDIATRIC BOTTLE Blood Culture results may not be optimal due to an excessive volume of blood received in culture bottles   Culture   Final    NO GROWTH 1 DAY Performed at [facility], [place] [address], [place], [place] [address]    Report Status PENDING  Incomplete  Culture, blood (routine x 2)     Status: None (Preliminary result)   Collection Time: 07/31/20 12:44 PM   Specimen: BLOOD RIGHT ARM  Result Value Ref Range Status   Specimen Description BLOOD RIGHT ARM  Final   Special Requests   Final    BOTTLES DRAWN AEROBIC ONLY Blood Culture results may not be optimal due to an inadequate volume of blood received in culture bottles   Culture   Final    NO GROWTH 1 DAY Performed at [facility], [place] [address], [place], [place] [address]    Report Status PENDING  Incomplete     Labs: Basic Metabolic Panel: Recent Labs  Lab 07/28/20 1630 07/28/20 1630 07/29/20 0342 07/30/20 0717 07/31/20 0826 08/01/20 0712 08/02/20 1049  NA 136   < > 137 131* 132* 144 136  K 4.5   < > 4.8 5.0 4.4 3.1* 4.2  CL 97*   < > 97* 95* 98 110 96*  CO2 21*  --  27 24 26   --  27  GLUCOSE 158*   < > 133* 110* 84 55* 74  BUN 62*   < > 77* 88* 31* 28* 31*  CREATININE 10.48*   < > 11.66* 12.82* 7.56* 6.10*  6.56*  CALCIUM 8.8*  --  9.1 9.4 9.0  --  8.9  MG  --   --  2.4  --   --   --   --   PHOS  --   --   --  8.3*  --   --   --    < > = values in this interval not displayed.   Liver Function Tests: Recent Labs  Lab 07/29/20 0342 07/30/20 0717 07/31/20 0826  AST 28  --  20  ALT 12  --  6  ALKPHOS 146*  --  114  BILITOT 0.4  --  0.6  PROT 7.0  --  6.9  ALBUMIN 2.1* 2.1* 2.0*   No results for input(s): LIPASE, AMYLASE in the last 168 hours. No results for input(s): AMMONIA in the last 168 hours. CBC: Recent Labs  Lab 07/28/20 0420 07/28/20 0440 07/29/20 0342 07/29/20 0342 07/30/20 0707 07/31/20 0914 08/01/20 0712 08/01/20 1221 08/02/20 1049  WBC 5.4   < > 3.7*  --  7.6 5.6  --  6.2 5.5  NEUTROABS 3.3  --  2.1  --   --  4.0  --   --  3.9  HGB 10.3*   < > 8.7*   < > 8.4* 8.9* 7.5* 8.1* 7.8*  HCT 34.9*   < > 28.6*   < > 28.2* 28.9* 22.0* 27.5* 26.6*  MCV 89.0   < > 88.8  --  89.2 87.8  --  88.7 88.1  PLT 269   < > 242  --  260 244  --  274 263   < > = values in this interval not displayed.   Cardiac Enzymes: No results for input(s): CKTOTAL, CKMB, CKMBINDEX, TROPONINI in the last 168 hours. BNP: BNP (last 3 results) No results for input(s): BNP in the last 8760 hours.  ProBNP (last 3 results) No results for input(s): PROBNP in the last 8760 hours.  CBG: Recent Labs  Lab 07/28/20 0516 07/28/20 1151 07/28/20 1301  GLUCAP 113* 79 77       Signed:  Kayleen Memos, MD [facility] 08/02/2020, 2:20 PM

## 2020-08-02 NOTE — Progress Notes (Signed)
Patient ID: [name], female   DOB: [birth year], 39 y.o.   MRN: [id number] Comfortable this morning.  Denies any tingling in her right hand.  Reports mild soreness at her antecubital incision.  Right antecubital incision healing nicely.  Excellent thrill.  Hand well perfused with palpable radial pulse.   Left arm healing with sutures intact.   Patient asking to go home.  She is fine for discharge from vascular surgery standpoint.  We will see her in the office in approximately 10 days for suture removal and follow-up of her for stage right brachiobasilic fistula

## 2020-08-02 NOTE — Progress Notes (Signed)
Discharge orders received.  Telemetry and IV removed, CCMD notified.  Pt education reviewed by SWAT nurse.  No further needs at this time.

## 2020-08-03 ENCOUNTER — Telehealth: Payer: Self-pay | Admitting: Nurse Practitioner

## 2020-08-03 NOTE — Telephone Encounter (Signed)
Transition of care contact from inpatient facility  Date of discharge: 08/02/2020 Date of contact: 08/03/2020 Method: Phone Spoke to: Patient  Patient contacted to discuss transition of care from recent inpatient hospitalization. Patient was admitted to [facility] from 08/01-08/05/2020 with discharge diagnosis of Hyperkalemia D/T missed HD, Bleeding from AVF-AVF ligated, new R AVF 08/01/2020-Dr. Early. Fever unknown etiology-suspected skin infection LFA. DC'd on doxycycline 100 mg PO BID for 7 days.  Medication changes were reviewed. Unfortunately hospital medications that she usually gets at HD were on medication list and she has purchased these meds. Told not to take oral calcitriol as she is given this medication at HD center. Reviewed all meds again with patient.   Patient will follow up with his/her outpatient HD unit on: 08/03/2020. Confirmed via [facility] that she did attend HD session.

## 2020-08-05 LAB — CULTURE, BLOOD (ROUTINE X 2)
Culture: NO GROWTH
Culture: NO GROWTH

## 2020-08-16 ENCOUNTER — Ambulatory Visit (INDEPENDENT_AMBULATORY_CARE_PROVIDER_SITE_OTHER): Payer: Medicaid Other | Admitting: Physician Assistant

## 2020-08-16 ENCOUNTER — Other Ambulatory Visit: Payer: Self-pay

## 2020-08-16 VITALS — BP 183/110 | HR 94 | Temp 98.1°F | Resp 14 | Ht 65.0 in | Wt 106.0 lb

## 2020-08-16 DIAGNOSIS — N186 End stage renal disease: Secondary | ICD-10-CM

## 2020-08-16 DIAGNOSIS — Z992 Dependence on renal dialysis: Secondary | ICD-10-CM

## 2020-08-16 NOTE — Progress Notes (Signed)
    Postoperative Access Visit   History of Present Illness   [name] is a 39 y.o. year old female who presents for postoperative follow-up for: Oversewing of left upper arm brachiocephalic fistula on 06/01/67 by Dr. Scot Dock due to bleeding. She subsequently had right first stage brachiobasilic AV fistula creation by Dr. Donnetta Hutching. The patient's wounds are healing well.  The patient notes no steal symptoms. She does have some tightness and swelling in the right forearm and around the incision. She says it is somewhat difficult to fully extend her right arm. She does report that she usually sleeps on her right arm with it bent. She otherwise is not having any pain or numbness in the right hand. She is not having any pain, numbness or swelling in the left arm or hand. She does have itching in the left upper arm in area where she has sutures. The patient is able to complete their activities of daily living.   She is currently dialyzing via left IJ TDC Tues/ Thurs/ Sat  Physical Examination   Vitals:   08/16/20 1559  BP: (!) 183/110  Pulse: 94  Resp: 14  Temp: 98.1 F (36.7 C)  TempSrc: Temporal  SpO2: 100%  Weight: 106 lb (48.1 kg)  Height: 5\' 5"  (1.651 m)   Body mass index is 17.64 kg/m.  bilateral arm Incisions are Healing well, 1+ radial pulse, hand grip is 5/5, sensation in digits is  intact, palpable thrill in right antecubital incision, bruit can be auscultated. Right forearm with mild swelling. Right hand warm. Left upper arm incisions intact and healing well. Sutures removed    Medical Decision Making   [name] is a 39 y.o. year old female who presents s/p postoperative follow-up for: Oversewing of left upper arm brachiocephalic fistula on 0/3/21 by Dr. Scot Dock due to bleeding. She subsequently had right first stage brachiobasilic AV fistula creation by Dr. Donnetta Hutching. The patient's wounds are healing well.  The patient notes no steal symptoms. Removed her left upper arm  sutures. She has follow up on 9/17 with fistula duplex to evaluate the maturation of her right brachiobasilic AV fistula. She will keep this appointment   Karoline Caldwell, PA-C [facility] [place] Office: [phone]  Clinic MD: Dr. Donzetta Matters

## 2020-08-30 ENCOUNTER — Other Ambulatory Visit: Payer: Self-pay

## 2020-08-30 DIAGNOSIS — Z992 Dependence on renal dialysis: Secondary | ICD-10-CM

## 2020-09-13 ENCOUNTER — Other Ambulatory Visit: Payer: Self-pay

## 2020-09-13 ENCOUNTER — Ambulatory Visit (HOSPITAL_COMMUNITY)
Admission: RE | Admit: 2020-09-13 | Discharge: 2020-09-13 | Disposition: A | Discharge status: Home / Self Care | Payer: Medicaid Other | Source: Ambulatory Visit | Attending: Vascular Surgery | Admitting: Vascular Surgery

## 2020-09-13 ENCOUNTER — Ambulatory Visit (INDEPENDENT_AMBULATORY_CARE_PROVIDER_SITE_OTHER): Payer: Self-pay | Admitting: Physician Assistant

## 2020-09-13 VITALS — Temp 98.3°F | Resp 20 | Ht 65.0 in | Wt 107.5 lb

## 2020-09-13 DIAGNOSIS — N186 End stage renal disease: Secondary | ICD-10-CM

## 2020-09-13 DIAGNOSIS — Z992 Dependence on renal dialysis: Secondary | ICD-10-CM | POA: Insufficient documentation

## 2020-09-13 NOTE — Progress Notes (Signed)
POST OPERATIVE DIALYSIS ACCESS OFFICE NOTE    CC:  F/u for dialysis access surgery  HPI:  This is a 39 y.o. female who is s/p right brachiocephalic AV fistula.  She denies hand pain fever or chills.  She dialyzes via left IJ hemodialysis catheter placed by Dr. Donzetta Matters on May 19, 2020.   Dialysis days: Tuesdays Thursdays Saturdays Dialysis center: [facility] kidney center  Allergies  Allergen Reactions  . Tobramycin Sulfate Swelling    Eye swelling    Current Outpatient Medications  Medication Sig Dispense Refill  . calcium acetate (PHOSLO) 667 MG capsule Take 1,334-2,668 mg by mouth See admin instructions. Take 4 capsule (2668 mg) by mouth three times a day with each meal and 2 capsules (1334 mg) with each snack    . camphor-menthol (SARNA) lotion Apply topically as needed for itching. 222 mL 0  . carvedilol (COREG) 12.5 MG tablet Take 12.5 mg by mouth 2 (two) times daily. Take one tablet (12.5 mg) by mouth every morning, take one tablet (12.5 mg) at night on Sunday, Monday, Wednesday, Friday    . cinacalcet (SENSIPAR) 30 MG tablet Take 30 mg by mouth daily.    . diphenhydrAMINE (BENADRYL) 25 MG tablet Take 25 mg by mouth at bedtime.     . heparin 1000 unit/mL SOLN injection 1,000 Units by Dialysis route one time in dialysis.     . hydrOXYzine (ATARAX/VISTARIL) 10 MG tablet Take 1 tablet (10 mg total) by mouth 3 (three) times daily as needed for anxiety. (Patient taking differently: Take 10 mg by mouth 3 (three) times daily as needed for itching or anxiety. ) 5 tablet 0  . loperamide (IMODIUM A-D) 2 MG tablet Take 4 mg by mouth daily.     . Methoxy PEG-Epoetin Beta (MIRCERA IJ) Inject 1 Dose as directed every 14 (fourteen) days.    . Nutritional Supplements (NOVASOURCE RENAL) LIQD Take 237 mLs by mouth See admin instructions. Take 1 container (237 mls) by at bedtime on dialysis days (Tuesday, Thursday, Saturday)     No current facility-administered medications for this visit.      ROS:  See HPI  Temp 98.3 F (36.8 C) (Temporal)   Resp 20   Ht 5\' 5" (1.651 m)   Wt 107 lb 8 oz (48.8 kg)   BMI 17.89 kg/m    Physical Exam:  General appearance: In no apparent distress Cardiac: Rate and rhythm regular Respiratory: Nonlabored Incision: Healing without signs of infection Extremities: Right upper extremity AV fistula with good bruit and thrill.  She has some dilated tributary veins along the anterior aspect of her upper arm.  Fistula is palpable along its course.  Hand grip strength is 5/5.  1-2+ radial pulse.  Sensation intact  Dialysis duplex on 09/13/2020 OUTFLOW VEINPSV (cm/s)Diameter (cm)Depth (cm)Describe  +------------+----------+-------------+----------+--------+  Prox UA     73    0.96     0.40        +------------+----------+-------------+----------+--------+  Mid UA     86    0.82     0.42        +------------+----------+-------------+----------+--------+  Dist UA     49 4    0.72     0.37   Branch   +------------+----------+-------------+----------+--------+  AC [place]    680    0.74     0.48    Assessment/Plan:   -pt does not have evidence of steal syndrome -dialysis duplex today reveals fistula to be of adequate diameter and depth for access -  the fistula/graft may be used starting October 24, 2020 -Arrangements can be made with our practice to remove her tunneled dialysis catheter at the discretion of her of her nephrology team  Barbie Banner, PA-C 09/13/2020 11:59 AM [facility] [phone]  Clinic MD: Donzetta Matters

## 2020-11-01 ENCOUNTER — Telehealth: Payer: Self-pay

## 2020-11-01 NOTE — Telephone Encounter (Signed)
Will from the dialysis center called to ask if pt's access could be cannulated. Per PA note - access may be used starting 10/24/20. Informed dialysis nurse and he verbalized understanding.

## 2020-11-02 ENCOUNTER — Inpatient Hospital Stay (HOSPITAL_COMMUNITY)
Admission: EM | Admit: 2020-11-02 | Discharge: 2020-11-05 | DRG: 291 | Disposition: A | Discharge status: Home / Self Care | Payer: Medicaid Other | Attending: Internal Medicine | Admitting: Internal Medicine

## 2020-11-02 ENCOUNTER — Emergency Department (HOSPITAL_COMMUNITY): Payer: Medicaid Other

## 2020-11-02 ENCOUNTER — Encounter (HOSPITAL_COMMUNITY): Payer: Self-pay

## 2020-11-02 DIAGNOSIS — I120 Hypertensive chronic kidney disease with stage 5 chronic kidney disease or end stage renal disease: Secondary | ICD-10-CM | POA: Diagnosis present

## 2020-11-02 DIAGNOSIS — M329 Systemic lupus erythematosus, unspecified: Secondary | ICD-10-CM | POA: Diagnosis present

## 2020-11-02 DIAGNOSIS — J9601 Acute respiratory failure with hypoxia: Secondary | ICD-10-CM | POA: Diagnosis not present

## 2020-11-02 DIAGNOSIS — Z992 Dependence on renal dialysis: Secondary | ICD-10-CM

## 2020-11-02 DIAGNOSIS — I5042 Chronic combined systolic (congestive) and diastolic (congestive) heart failure: Secondary | ICD-10-CM | POA: Diagnosis present

## 2020-11-02 DIAGNOSIS — I248 Other forms of acute ischemic heart disease: Secondary | ICD-10-CM | POA: Diagnosis present

## 2020-11-02 DIAGNOSIS — F17210 Nicotine dependence, cigarettes, uncomplicated: Secondary | ICD-10-CM | POA: Diagnosis present

## 2020-11-02 DIAGNOSIS — R011 Cardiac murmur, unspecified: Secondary | ICD-10-CM | POA: Diagnosis present

## 2020-11-02 DIAGNOSIS — J811 Chronic pulmonary edema: Secondary | ICD-10-CM

## 2020-11-02 DIAGNOSIS — N186 End stage renal disease: Secondary | ICD-10-CM | POA: Diagnosis present

## 2020-11-02 DIAGNOSIS — I080 Rheumatic disorders of both mitral and aortic valves: Secondary | ICD-10-CM | POA: Diagnosis present

## 2020-11-02 DIAGNOSIS — I132 Hypertensive heart and chronic kidney disease with heart failure and with stage 5 chronic kidney disease, or end stage renal disease: Principal | ICD-10-CM | POA: Diagnosis present

## 2020-11-02 DIAGNOSIS — Z888 Allergy status to other drugs, medicaments and biological substances status: Secondary | ICD-10-CM

## 2020-11-02 DIAGNOSIS — Z9115 Patient's noncompliance with renal dialysis: Secondary | ICD-10-CM

## 2020-11-02 DIAGNOSIS — Z09 Encounter for follow-up examination after completed treatment for conditions other than malignant neoplasm: Secondary | ICD-10-CM

## 2020-11-02 DIAGNOSIS — D649 Anemia, unspecified: Secondary | ICD-10-CM | POA: Diagnosis present

## 2020-11-02 DIAGNOSIS — F1490 Cocaine use, unspecified, uncomplicated: Secondary | ICD-10-CM | POA: Diagnosis present

## 2020-11-02 DIAGNOSIS — I5043 Acute on chronic combined systolic (congestive) and diastolic (congestive) heart failure: Secondary | ICD-10-CM | POA: Diagnosis present

## 2020-11-02 DIAGNOSIS — Z79899 Other long term (current) drug therapy: Secondary | ICD-10-CM

## 2020-11-02 DIAGNOSIS — I471 Supraventricular tachycardia: Secondary | ICD-10-CM | POA: Diagnosis present

## 2020-11-02 DIAGNOSIS — N898 Other specified noninflammatory disorders of vagina: Secondary | ICD-10-CM | POA: Diagnosis present

## 2020-11-02 DIAGNOSIS — F1910 Other psychoactive substance abuse, uncomplicated: Secondary | ICD-10-CM | POA: Diagnosis present

## 2020-11-02 DIAGNOSIS — I4891 Unspecified atrial fibrillation: Secondary | ICD-10-CM | POA: Diagnosis present

## 2020-11-02 DIAGNOSIS — Z20822 Contact with and (suspected) exposure to covid-19: Secondary | ICD-10-CM | POA: Diagnosis present

## 2020-11-02 DIAGNOSIS — E875 Hyperkalemia: Secondary | ICD-10-CM | POA: Diagnosis present

## 2020-11-02 DIAGNOSIS — I429 Cardiomyopathy, unspecified: Secondary | ICD-10-CM | POA: Diagnosis present

## 2020-11-02 LAB — RESPIRATORY PANEL BY RT PCR (FLU A&B, COVID)
Influenza A by PCR: NEGATIVE
Influenza B by PCR: NEGATIVE
SARS Coronavirus 2 by RT PCR: NEGATIVE

## 2020-11-02 LAB — COMPREHENSIVE METABOLIC PANEL
ALT: 27 U/L (ref 0–44)
AST: 38 U/L (ref 15–41)
Albumin: 2.5 g/dL — ABNORMAL LOW (ref 3.5–5.0)
Alkaline Phosphatase: 164 U/L — ABNORMAL HIGH (ref 38–126)
Anion gap: 18 — ABNORMAL HIGH (ref 5–15)
BUN: 75 mg/dL — ABNORMAL HIGH (ref 6–20)
CO2: 19 mmol/L — ABNORMAL LOW (ref 22–32)
Calcium: 9.7 mg/dL (ref 8.9–10.3)
Chloride: 99 mmol/L (ref 98–111)
Creatinine, Ser: 8.68 mg/dL — ABNORMAL HIGH (ref 0.44–1.00)
GFR, Estimated: 6 mL/min — ABNORMAL LOW (ref >60)
Glucose, Bld: 84 mg/dL (ref 70–99)
Potassium: 5.9 mmol/L — ABNORMAL HIGH (ref 3.5–5.1)
Sodium: 136 mmol/L (ref 135–145)
Total Bilirubin: 0.7 mg/dL (ref 0.3–1.2)
Total Protein: 8.5 g/dL — ABNORMAL HIGH (ref 6.5–8.1)

## 2020-11-02 LAB — I-STAT CHEM 8, ED
BUN: 69 mg/dL — ABNORMAL HIGH (ref 6–20)
Calcium, Ion: 1.25 mmol/L (ref 1.15–1.40)
Chloride: 102 mmol/L (ref 98–111)
Creatinine, Ser: 10.3 mg/dL — ABNORMAL HIGH (ref 0.44–1.00)
Glucose, Bld: 89 mg/dL (ref 70–99)
HCT: 36 % (ref 36.0–46.0)
Hemoglobin: 12.2 g/dL (ref 12.0–15.0)
Potassium: 6 mmol/L — ABNORMAL HIGH (ref 3.5–5.1)
Sodium: 139 mmol/L (ref 135–145)
TCO2: 24 mmol/L (ref 22–32)

## 2020-11-02 LAB — CBC WITH DIFFERENTIAL/PLATELET
Abs Immature Granulocytes: 0.02 10*3/uL (ref 0.00–0.07)
Basophils Absolute: 0 10*3/uL (ref 0.0–0.1)
Basophils Relative: 0 %
Eosinophils Absolute: 0.2 10*3/uL (ref 0.0–0.5)
Eosinophils Relative: 4 %
HCT: 36.6 % (ref 36.0–46.0)
Hemoglobin: 10.7 g/dL — ABNORMAL LOW (ref 12.0–15.0)
Immature Granulocytes: 0 %
Lymphocytes Relative: 26 %
Lymphs Abs: 1.3 10*3/uL (ref 0.7–4.0)
MCH: 25.9 pg — ABNORMAL LOW (ref 26.0–34.0)
MCHC: 29.2 g/dL — ABNORMAL LOW (ref 30.0–36.0)
MCV: 88.6 fL (ref 80.0–100.0)
Monocytes Absolute: 0.5 10*3/uL (ref 0.1–1.0)
Monocytes Relative: 10 %
Neutro Abs: 3 10*3/uL (ref 1.7–7.7)
Neutrophils Relative %: 60 %
Platelets: 292 10*3/uL (ref 150–400)
RBC: 4.13 MIL/uL (ref 3.87–5.11)
RDW: 18.2 % — ABNORMAL HIGH (ref 11.5–15.5)
WBC: 5 10*3/uL (ref 4.0–10.5)
nRBC: 0 % (ref 0.0–0.2)

## 2020-11-02 LAB — CBG MONITORING, ED
Glucose-Capillary: 53 mg/dL — ABNORMAL LOW (ref 70–99)
Glucose-Capillary: 64 mg/dL — ABNORMAL LOW (ref 70–99)

## 2020-11-02 LAB — TROPONIN I (HIGH SENSITIVITY): Troponin I (High Sensitivity): 148 ng/L (ref <18)

## 2020-11-02 LAB — GLUCOSE, RANDOM: Glucose, Bld: 85 mg/dL (ref 70–99)

## 2020-11-02 LAB — BRAIN NATRIURETIC PEPTIDE: B Natriuretic Peptide: >4500 pg/mL — ABNORMAL HIGH (ref 0.0–100.0)

## 2020-11-02 MED ORDER — ALBUTEROL SULFATE (2.5 MG/3ML) 0.083% IN NEBU
2.5000 mg | INHALATION_SOLUTION | Freq: Once | RESPIRATORY_TRACT | Status: AC
  Administered 2020-11-02: 2.5 mg via RESPIRATORY_TRACT
  Filled 2020-11-02: qty 3

## 2020-11-02 MED ORDER — HYDROXYZINE HCL 10 MG PO TABS
10.0000 mg | ORAL_TABLET | Freq: Three times a day (TID) | ORAL | Status: DC | PRN
  Administered 2020-11-03 – 2020-11-04 (×5): 10 mg via ORAL
  Filled 2020-11-02 (×5): qty 1

## 2020-11-02 MED ORDER — DEXTROSE 50 % IV SOLN
INTRAVENOUS | Status: AC
  Filled 2020-11-02: qty 50

## 2020-11-02 MED ORDER — DOXERCALCIFEROL 4 MCG/2ML IV SOLN
2.0000 ug | INTRAVENOUS | Status: DC
  Administered 2020-11-05: 2 ug via INTRAVENOUS
  Filled 2020-11-02: qty 2

## 2020-11-02 MED ORDER — SODIUM CHLORIDE 0.9% FLUSH
3.0000 mL | Freq: Two times a day (BID) | INTRAVENOUS | Status: DC
  Administered 2020-11-02 – 2020-11-04 (×4): 3 mL via INTRAVENOUS

## 2020-11-02 MED ORDER — INSULIN ASPART 100 UNIT/ML IV SOLN
5.0000 [IU] | Freq: Once | INTRAVENOUS | Status: AC
  Administered 2020-11-02: 5 [IU] via INTRAVENOUS

## 2020-11-02 MED ORDER — CARVEDILOL 12.5 MG PO TABS
12.5000 mg | ORAL_TABLET | Freq: Two times a day (BID) | ORAL | Status: DC
  Administered 2020-11-03 – 2020-11-05 (×4): 12.5 mg via ORAL
  Filled 2020-11-02 (×4): qty 1

## 2020-11-02 MED ORDER — CALCITRIOL 0.5 MCG PO CAPS
2.0000 ug | ORAL_CAPSULE | ORAL | Status: DC
  Administered 2020-11-05: 2 ug via ORAL

## 2020-11-02 MED ORDER — ONDANSETRON HCL 4 MG/2ML IJ SOLN
4.0000 mg | Freq: Once | INTRAMUSCULAR | Status: AC
  Administered 2020-11-02: 4 mg via INTRAVENOUS
  Filled 2020-11-02: qty 2

## 2020-11-02 MED ORDER — CALCIUM GLUCONATE 10 % IV SOLN
1.0000 g | Freq: Once | INTRAVENOUS | Status: AC
  Administered 2020-11-02: 1 g via INTRAVENOUS
  Filled 2020-11-02: qty 10

## 2020-11-02 MED ORDER — DEXTROSE 50 % IV SOLN
1.0000 | Freq: Once | INTRAVENOUS | Status: AC
  Administered 2020-11-02: 50 mL via INTRAVENOUS
  Filled 2020-11-02: qty 50

## 2020-11-02 MED ORDER — CALCIUM ACETATE (PHOS BINDER) 667 MG PO CAPS
2668.0000 mg | ORAL_CAPSULE | Freq: Three times a day (TID) | ORAL | Status: DC
  Administered 2020-11-03 (×3): 1334 mg via ORAL
  Administered 2020-11-04 (×3): 2668 mg via ORAL
  Filled 2020-11-02 (×5): qty 4

## 2020-11-02 MED ORDER — RENA-VITE PO TABS
1.0000 | ORAL_TABLET | Freq: Every day | ORAL | Status: DC
  Administered 2020-11-03 – 2020-11-04 (×2): 1 via ORAL
  Filled 2020-11-02 (×3): qty 1

## 2020-11-02 MED ORDER — DEXTROSE 50 % IV SOLN
1.0000 | Freq: Once | INTRAVENOUS | Status: AC
  Administered 2020-11-02: 50 mL via INTRAVENOUS

## 2020-11-02 MED ORDER — LOPERAMIDE HCL 2 MG PO CAPS: 4.0000 mg | ORAL_CAPSULE | Freq: Every day | ORAL | Status: DC | PRN

## 2020-11-02 MED ORDER — CHLORHEXIDINE GLUCONATE CLOTH 2 % EX PADS
6.0000 | MEDICATED_PAD | Freq: Every day | CUTANEOUS | Status: DC
  Administered 2020-11-03 – 2020-11-05 (×3): 6 via TOPICAL

## 2020-11-02 MED ORDER — HEPARIN SODIUM (PORCINE) 5000 UNIT/ML IJ SOLN
5000.0000 [IU] | Freq: Three times a day (TID) | INTRAMUSCULAR | Status: DC
  Administered 2020-11-03 – 2020-11-05 (×6): 5000 [IU] via SUBCUTANEOUS
  Filled 2020-11-02 (×6): qty 1

## 2020-11-02 NOTE — H&P (Addendum)
History and Physical   [name] PJK:[id number] DOB: 09-06-1981 DOA: 11/02/2020  PCP: Patient, No Pcp Per   Patient coming from: Home  Chief Complaint: Shortness of breath  HPI: [name] is a 39 y.o. female with medical history significant of end-stage renal disease secondary to lupus on HD TTS, combined systolic and diastolic heart failure, history of polysubstance use, distant history of pericarditis with pericardial effusion status post pericardiocentesis, and lupus who presents with progressive dyspnea.  Patient began to feel short of breath when laying down for bed this evening.  She states that she had noticed some exertional dyspnea when going up the stairs earlier in the day.  This is in the setting of missing dialysis today because she overslept.  She states that she also did not meet her fluid removal goal during her previous dialysis session.  On arrival patient was saturating 6% on room air, saturations improved to the 90s on 3 L.  Patient endorses orthopnea.  She also endorses an unknown vaginal discharge without odor nor pruritus.  She endorses mild nausea as well.  She denies chest pain, abdominal pain, constipation, diarrhea,, fevers.  ED Course: Vital signs in ED significant for tachypnea to 26, intermittent tachycardia in the low 100s, and hypertension up to 170/110.  Labs significant for potassium of 5.9, bicarb 19 with gap of 18, BUN of 75, creatinine of 8.6 (consistent with ESRD having missed HD), mildly low protein and albumin of 2.5, with mild elevation of ALP 164.  Hemoglobin stable/improved at 10.7.  BNP noted to be greater than 4500.  Initial troponin I 48, repeat pending.  Respiratory panel negative for Covid or flu.  Chest x-ray showed cardiomegaly and interstitial edema.  Review of Systems: As per HPI otherwise all other systems reviewed and are negative.  Past Medical History:  Diagnosis Date  . Anemia    low iron - receives iron at dialysis  . Chronic  systolic congestive heart failure ([place]) 03/16/2016  . Dyspnea   . ESRD (end stage renal disease) ([place])    [place] [facility] [place]   . H/O pericarditis 01/17/2013  . H/O pleural effusion 01/17/2013  . Heart murmur   . Lupus (systemic lupus erythematosus) (HCC)    Previously followed with Dr. Charlestine Night, has not followed up recently  . 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 Pneumonia    many times  . Polysubstance abuse ([place])    cocaine, MJ, tobacco  . S/P pericardiocentesis 01/17/2013   H/o pericardial effusion with tamponade 2006   . Seizures ([place])    during pregnancy 1 time  . Streptococcal bacteremia 01/23/2013   She had two S. pneumonae bacteremia on 01/21/2013. Sensitive to Peniccilin     Past Surgical History:  Procedure Laterality Date  . AV FISTULA PLACEMENT    . AV FISTULA PLACEMENT Right 08/01/2020   Procedure: RIGHT ARM BRACHIOCEPHALIC  ARTERIOVENOUS (AV) FISTULA CREATION;  Surgeon: [name], MD;  Location: [facility];  Service: Vascular;  Laterality: Right;  . BASCILIC VEIN TRANSPOSITION Left 02/05/2014   Procedure: [place];  Surgeon: [name], MD;  Location: [place];  Service: Vascular;  Laterality: Left;  . FISTULA SUPERFICIALIZATION Left 05/30/2018   Procedure: FISTULA PLICATION BASILIC VEIN TRANSPOSITION;  Surgeon: Angelia Mould, MD;  Location: [facility];  Service: Vascular;  Laterality: Left;  . FISTULA  SUPERFICIALIZATION Left 59/56/3875   Procedure: PLICATION OF LEFT ARTERIOVENOUS FISTULA ULCER;  Surgeon: [name], MD;  Location: [facility];  Service: Vascular;  Laterality: Left;  . INSERTION OF DIALYSIS CATHETER N/A 05/19/2020   Procedure: TUNNELED INSERTION  OF DIALYSIS CATHETER;  Surgeon: Waynetta Sandy, MD;  Location: [place];  Service: Vascular;  Laterality: N/A;  . THROMBECTOMY AND  REVISION OF ARTERIOVENTOUS (AV) GORETEX  GRAFT Left 07/28/2020   Procedure: Oversewing of left arm Brachial cephalic fistula for bleeding.;  Surgeon: Angelia Mould, MD;  Location: [place] [facility];  Service: Vascular;  Laterality: Left;  Marland Kitchen VENOGRAM Right 01/31/2014   Procedure: DIALYSIS CATHETER;  Surgeon: Serafina Mitchell, MD;  Location: [facility] CATH LAB;  Service: Cardiovascular;  Laterality: Right;    Social History  reports that she has been smoking cigarettes. She has a 1.80 pack-year smoking history. She has never used smokeless tobacco. She reports current alcohol use. She reports current drug use. Frequency: 5.00 times per week. Drug: Marijuana.  Allergies  Allergen Reactions  . Tobramycin Sulfate Swelling    Eye swelling    History reviewed. No pertinent family history. Reviewed on admission  Prior to Admission medications   Medication Sig Start Date End Date Taking? Authorizing Provider  calcium acetate (PHOSLO) 667 MG capsule Take 1,334-2,668 mg by mouth See admin instructions. Take 4 capsule (2668 mg) by mouth three times a day with each meal and 2 capsules (1334 mg) with each snack   Yes [provider]  carvedilol (COREG) 12.5 MG tablet Take 12.5 mg by mouth 2 (two) times daily with a meal.  11/07/19  Yes [provider]  diphenhydrAMINE (BENADRYL) 25 MG tablet Take 25 mg by mouth at bedtime.    Yes [provider]  hydrOXYzine (ATARAX/VISTARIL) 10 MG tablet Take 1 tablet (10 mg total) by mouth 3 (three) times daily as needed for anxiety. Patient taking differently: Take 10 mg by mouth 3 (three) times daily as needed for itching or anxiety.  07/29/19  Yes Caccavale, Sophia, PA-C  loperamide (IMODIUM A-D) 2 MG tablet Take 4 mg by mouth daily as needed for diarrhea or loose stools.    Yes [provider]  multivitamin (RENA-VIT) TABS tablet Take 1 tablet by mouth daily.   Yes [provider]  Nutritional Supplements (NOVASOURCE RENAL) LIQD Take  237 mLs by mouth See admin instructions. Take 1 container (237 mls) by at bedtime on dialysis days (Tuesday, Thursday, Saturday)   Yes [provider]  heparin 1000 unit/mL SOLN injection 1,000 Units by Dialysis route one time in dialysis.  05/21/20 06/05/21  [provider]  Methoxy PEG-Epoetin Beta (MIRCERA IJ) Inject 1 Dose as directed every 14 (fourteen) days. 03/16/20 03/15/21  [provider]    Physical Exam: Vitals:   11/02/20 2130 11/02/20 2241 11/02/20 2245 11/02/20 2345  BP: (!) 158/102 (!) 127/100 (!) 139/110 (!) 173/118  Pulse: 83 100  (!) 26  Resp: (!) 26 (!) 27 (!) 22 (!) 26  Temp:      TempSrc:      SpO2: 100% 100%  97%  Weight:      Height:       Physical Exam Constitutional:      Appearance: She is ill-appearing.     Comments: Thin female in moderate distrss  HENT:     Head: Normocephalic and atraumatic.     Mouth/Throat:     Mouth: Mucous membranes are moist.     Pharynx: Oropharynx  is clear.  Eyes:     Extraocular Movements: Extraocular movements intact.     Pupils: Pupils are equal, round, and reactive to light.  Cardiovascular:     Rate and Rhythm: Normal rate and regular rhythm.     Pulses: Normal pulses.     Heart sounds: Normal heart sounds.  Pulmonary:     Breath sounds: Rales present.     Comments: Moderate increased work of breathing and moderate respiratory distress, on 4L [place] Abdominal:     General: Bowel sounds are normal. There is no distension.     Palpations: Abdomen is soft.     Tenderness: There is no abdominal tenderness.  Musculoskeletal:        General: No swelling or deformity.  Skin:    General: Skin is warm and dry.  Neurological:     General: No focal deficit present.     Mental Status: Mental status is at baseline.    Labs on Admission: I have personally reviewed following labs and imaging studies  CBC: Recent Labs  Lab 11/02/20 2114 11/02/20 2137  WBC 5.0  --   NEUTROABS 3.0  --   HGB 10.7*  12.2  HCT 36.6 36.0  MCV 88.6  --   PLT 292  --     Basic Metabolic Panel: Recent Labs  Lab 11/02/20 2114 11/02/20 2121 11/02/20 2137  NA 136  --  139  K 5.9*  --  6.0*  CL 99  --  102  CO2 19*  --   --   GLUCOSE 84 85 89  BUN 75*  --  69*  CREATININE 8.68*  --  10.30*  CALCIUM 9.7  --   --     GFR: Estimated Creatinine Clearance: 5.7 mL/min (A) (by C-G formula based on SCr of 10.3 mg/dL (H)).  Liver Function Tests: Recent Labs  Lab 11/02/20 2114  AST 38  ALT 27  ALKPHOS 164*  BILITOT 0.7  PROT 8.5*  ALBUMIN 2.5*    Urine analysis:    Component Value Date/Time   COLORURINE YELLOW 09/25/2015 1412   APPEARANCEUR CLOUDY (A) 09/25/2015 1412   LABSPEC 1.018 09/25/2015 1412   PHURINE 8.0 09/25/2015 1412   GLUCOSEU NEGATIVE 09/25/2015 1412   HGBUR LARGE (A) 09/25/2015 1412   BILIRUBINUR NEGATIVE 09/25/2015 1412   [place] 09/25/2015 1412   PROTEINUR >300 (A) 09/25/2015 1412   UROBILINOGEN 0.2 09/25/2015 1412   NITRITE NEGATIVE 09/25/2015 1412   LEUKOCYTESUR MODERATE (A) 09/25/2015 1412    Radiological Exams on Admission: DG Chest Port 1 View  Result Date: 11/02/2020 CLINICAL DATA:  Chest pain shortness of breath EXAM: PORTABLE CHEST 1 VIEW COMPARISON:  July 31, 2020 FINDINGS: The heart size and mediastinal contours are mildly enlarged. Aortic knob calcifications are seen. Diffusely increased interstitial markings are seen throughout both lungs. A small left pleural effusion is present. A left-sided central venous catheter seen with the tip at the cavoatrial junction. IMPRESSION: Mild cardiomegaly and interstitial edema. Small left pleural effusion. Electronically Signed   By: Prudencio Pair M.D.   On: 11/02/2020 21:49    EKG: Independently reviewed.  Sinus tachycardia with peaked T waves similar to previous episode of hyperkalemia.  Normal QRS duration.  Assessment/Plan Principal Problem:   Acute respiratory failure with hypoxia (HCC) Active  Problems:   Benign hypertension with ESRD (end-stage renal disease) (HCC)   Chronic combined systolic (congestive) and diastolic (congestive) heart failure (HCC)   ESRD (end stage renal disease) on dialysis (  [employer])   Acute hyperkalemia   Polysubstance abuse (HCC)  Hyperkalemia Acute Volume overload Acute hypoxic Respiratory Failure end-stage renal disease secondary to lupus on HD TTS > Patient acutely volume overload with hyperkalemia of 5.9 and peaked T waves on EKG in the setting of incomplete volume removal during last HD session and oversleeping through her session today. > Now requiring 3 to 4 L to maintain saturations.  Hypoxic to 60% on room air prior to starting supplemental oxygen. > Seen by nephrology in ED who will take patient for dialysis tonight - Trend BMP  Combined systolic and diastolic heart failure (echo 02/17/2017 EF 45%, G2 DD) > Exacerbation of heart failure in the setting of volume overload due to missed dialysis session and incomplete volume removal during most recent dialysis session. > BNP greater than 4500 > Volume removal 100% done by HD outpatient - Monitor for improvement with removal of fluids during HD - Ins and outs, daily weights - Has been several years since last echo, will repeat this - Trend BMP  History of polysubstance use > Reports using cocaine as recently as 2 weeks ago. -Counseled on cessation  Of note, distant history of pericarditis with pericardial effusion status post pericardiocentesis 2/2 lupus  DVT prophylaxis: Heparin  Code Status:   Full  Family Communication:  None on admission  Disposition Plan:   Patient is from:  Home  Anticipated DC to:  Home  Anticipated DC date:  Pending volume removal  Anticipated DC barriers: None  Consults called:  Nephrology, [place], consulted by EDP Admission status:  Observation, telemetry  Severity of Illness: The appropriate patient status for this patient is OBSERVATION. Observation status  is judged to be reasonable and necessary in order to provide the required intensity of service to ensure the patient's safety. The patient's presenting symptoms, physical exam findings, and initial radiographic and laboratory data in the context of their medical condition is felt to place them at decreased risk for further clinical deterioration. Furthermore, it is anticipated that the patient will be medically stable for discharge from the hospital within 2 midnights of admission. The following factors support the patient status of observation.   " The patient's presenting symptoms include dyspnea. " The physical exam findings include hypoxic respiratory failure, rales. " The initial radiographic and laboratory data are concerning for hyperkalemia, volume overload with interstitial edema on chest x-ray, BNP greater than 4500, elevated troponin.   Marcelyn Bruins MD [facility]  How to contact the [facility] Attending or Consulting provider [place] or covering provider during after hours [place], for this patient?   1. Check the care team in [facility] and look for a) attending/consulting TR[facility] provider listed and b) the [facility] team listed 2. Log into [email or web address] and use [place]'s universal password to access. If you do not have the password, please contact the hospital operator. 3. Locate the [facility] provider you are looking for under [facility] and page to a number that you can be directly reached. 4. If you still have difficulty reaching the provider, please page the [facility] (Director on Call) for the Hospitalists listed on amion for assistance.  11/03/2020, 12:56 AM

## 2020-11-02 NOTE — ED Provider Notes (Signed)
[facility] EMERGENCY DEPARTMENT Provider Note   CSN: [id number] Arrival date & time: 11/02/20  1919     History Chief Complaint  Patient presents with   Shortness of Breath    [name] is a 39 y.o. female.  The history is provided by the patient.  Shortness of Breath Severity:  Moderate Onset quality:  Gradual Duration:  4 hours Timing:  Constant Progression:  Worsening Chronicity:  New Context comment:  Missed HD Relieved by:  Oxygen Worsened by:  Exertion Associated symptoms: chest pain   Associated symptoms: no abdominal pain, no cough, no ear pain, no fever, no rash, no sore throat and no vomiting        Past Medical History:  Diagnosis Date   Anemia    low iron - receives iron at dialysis   Chronic systolic congestive heart failure ([place]) 03/16/2016   Dyspnea    ESRD (end stage renal disease) ([place])    H[place] TTHSAT _ [place]   H/O pericarditis 01/17/2013   H/O pleural effusion 01/17/2013   Heart murmur    Lupus (systemic lupus erythematosus) ([place])    Previously followed with Dr. 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)    during pregnancy 1 time   Streptococcal bacteremia 01/23/2013   She had two S. pneumonae bacteremia on 01/21/2013. Sensitive to Peniccilin     Patient Active Problem List   Diagnosis Date Noted   Acute hyperkalemia 07/28/2020   Nicotine dependence, cigarettes, uncomplicated 24/58/0998   Essential hypertension 07/28/2020   Polysubstance abuse ([place]) 07/28/2020   Hemodialysis catheter malfunction ([place]) 33/82/5053   Complication  of AV dialysis fistula, sequela 07/28/2020   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[place]) 05/18/2020   Hyperkalemia 07/29/2018   SVT (supraventricular tachycardia) ([place]) 07/16/2018   ESRD (end stage renal disease) on dialysis ([place]) 02/17/2017   Chronic combined systolic (congestive) and diastolic (congestive) heart failure ([place]) 03/16/2016   Other hypervolemia    S/P thoracentesis    Cough with hemoptysis    Myalgia    Pulmonary edema    Rash and nonspecific skin eruption 06/26/2015   Secondary Raynaud's phenomenon 06/24/2015   Cramping of hands 04/19/2014   Unspecified contraceptive management 04/19/2014   End stage renal disease ([place]) 03/27/2014   Insomnia 03/15/2014   Benign hypertension with ESRD (end-stage renal disease) ([place]) 03/15/2014   Tobacco abuse 02/15/2014   Healthcare maintenance 02/15/2014   Hypoalbuminemia 02/01/2014   Nephrotic syndrome 02/01/2014   ESRD on dialysis ([place]) 01/31/2014   Pleural effusion, left 01/31/2014   Microcytic anemia 01/29/2014   Hypocalcemia 01/29/2014   Cocaine abuse ([place]) 01/18/2013   Marijuana smoker ([place]) 01/18/2013   H/O pericarditis 01/17/2013   SLE (systemic lupus erythematosus) ([place]) 01/17/2013   Lupus nephritis ([place]) 01/17/2013   S/P pericardiocentesis 01/17/2013   H/O pleural effusion 01/17/2013   Nephrosis 01/17/2013   Preseptal cellulitis 01/17/2013    Past Surgical History:  Procedure Laterality Date   AV FISTULA PLACEMENT     AV FISTULA PLACEMENT Right 08/01/2020   Procedure: RIGHT ARM BRACHIOCEPHALIC  ARTERIOVENOUS (AV) FISTULA CREATION;  Surgeon: [name], MD;  Location: [facility] OR;  Service: Vascular;  Laterality: Right;   [place] Left 02/05/2014   Procedure: [place];  Surgeon: [name], MD;  Location: [place];  Service: Vascular;  Laterality: Left;   FISTULA SUPERFICIALIZATION Left 05/30/2018   Procedure: FISTULA PLICATION BASILIC VEIN TRANSPOSITION;  Surgeon:  Angelia Mould, MD;  Location: [place];  Service: Vascular;  Laterality: Left;   FISTULA SUPERFICIALIZATION Left 28/36/6294   Procedure: PLICATION OF LEFT ARTERIOVENOUS FISTULA ULCER;  Surgeon: [name], MD;  Location: [place];  Service: Vascular;  Laterality: Left;   INSERTION OF DIALYSIS CATHETER N/A 05/19/2020   Procedure: TUNNELED INSERTION  OF DIALYSIS CATHETER;  Surgeon: Waynetta Sandy, MD;  Location: [place];  Service: Vascular;  Laterality: N/A;   THROMBECTOMY AND REVISION OF ARTERIOVENTOUS (AV) GORETEX  GRAFT Left 07/28/2020   Procedure: Oversewing of left arm Brachial cephalic fistula for bleeding.;  Surgeon: Angelia Mould, MD;  Location: [place];  Service: Vascular;  Laterality: Left;   VENOGRAM Right 01/31/2014   Procedure: DIALYSIS CATHETER;  Surgeon: Serafina Mitchell, MD;  Location: [facility] CATH LAB;  Service: Cardiovascular;  Laterality: Right;     OB History   No obstetric history on file.     History reviewed. No pertinent family history.  Social History   Tobacco Use   Smoking status: Current Every Day Smoker    Packs/day: 0.12    Years: 15.00    Pack years: 1.80    Types: Cigarettes   Smokeless tobacco: Never Used   Tobacco comment: 4 cigarettes per day  Vaping Use   Vaping Use: Never used  Substance Use Topics   Alcohol use: Yes    Alcohol/week: 0.0 standard drinks    Comment: Special Occasional takes Vicar   Drug use: Yes    Frequency: 5.0 times per week    Types: Marijuana    Comment: Marijuana and cocaine used last - Thanksgiving 2020    Home Medications Prior to Admission medications   Medication Sig Start Date End Date Taking? Authorizing Provider  calcium acetate (PHOSLO) 667 MG capsule Take 1,334-2,668 mg by mouth See admin instructions. Take 4 capsule (2668 mg) by mouth three times a day with each meal and 2 capsules (1334 mg) with each snack   Yes [provider]  carvedilol (COREG) 12.5 MG tablet Take 12.5 mg  by mouth 2 (two) times daily with a meal.  11/07/19  Yes [provider]  diphenhydrAMINE (BENADRYL) 25 MG tablet Take 25 mg by mouth at bedtime.    Yes [provider]  hydrOXYzine (ATARAX/VISTARIL) 10 MG tablet Take 1 tablet (10 mg total) by mouth 3 (three) times daily as needed for anxiety. Patient taking differently: Take 10 mg by mouth 3 (three) times daily as needed for itching or anxiety.  07/29/19  Yes Caccavale, Sophia, PA-C  loperamide (IMODIUM A-D) 2 MG tablet Take 4 mg by mouth daily as needed for diarrhea or loose stools.    Yes [provider]  multivitamin (RENA-VIT) TABS tablet Take 1 tablet by mouth daily.   Yes [provider]  Nutritional Supplements (NOVASOURCE RENAL) LIQD Take 237 mLs by mouth See admin instructions. Take 1 container (237 mls) by at bedtime on dialysis days (Tuesday, Thursday, Saturday)   Yes [provider]  heparin 1000 unit/mL SOLN injection 1,000 Units by Dialysis route one time in dialysis.  05/21/20 06/05/21  [provider]  Methoxy PEG-Epoetin Beta (MIRCERA IJ) Inject  1 Dose as directed every 14 (fourteen) days. 03/16/20 03/15/21  [provider]    Allergies    Tobramycin sulfate  Review of Systems   Review of Systems  Constitutional: Positive for fatigue. Negative for chills and fever.  HENT: Negative for ear pain and sore throat.   Eyes: Negative for pain and visual disturbance.  Respiratory: Positive for shortness of breath. Negative for cough.   Cardiovascular: Positive for chest pain. Negative for palpitations.  Gastrointestinal: Negative for abdominal pain and vomiting.  Genitourinary: Negative for dysuria and hematuria.  Musculoskeletal: Negative for arthralgias and back pain.  Skin: Negative for color change and rash.  Neurological: Negative for seizures and syncope.  All other systems reviewed and are negative.   Physical Exam Updated Vital Signs BP (!) 127/100 (BP Location:  Left Leg)    Pulse 100    Temp 97.7 F (36.5 C) (Oral)    Resp (!) 27    Ht 5\' 6"  (1.676 m)    Wt 47.9 kg    SpO2 100%    BMI 17.03 kg/m   Physical Exam Vitals and nursing note reviewed.  Constitutional:      Appearance: She is ill-appearing (appears chronically ill). She is not toxic-appearing or diaphoretic.  HENT:     Head: Normocephalic and atraumatic.  Eyes:     Conjunctiva/sclera: Conjunctivae normal.  Cardiovascular:     Rate and Rhythm: Regular rhythm. Tachycardia present.     Heart sounds: No murmur heard.  Gallop present.   Pulmonary:     Effort: Pulmonary effort is normal. Tachypnea present. No respiratory distress.     Breath sounds: Rhonchi and rales present.  Abdominal:     Palpations: Abdomen is soft.     Tenderness: There is no abdominal tenderness.  Musculoskeletal:     Cervical back: Neck supple.     Right lower leg: No tenderness. No edema.     Left lower leg: No tenderness. No edema.  Skin:    General: Skin is warm and dry.  Neurological:     General: No focal deficit present.     Mental Status: She is alert.     ED Results / Procedures / Treatments   Labs (all labs ordered are listed, but only abnormal results are displayed) Labs Reviewed  CBC WITH DIFFERENTIAL/PLATELET - Abnormal; Notable for the following components:      Result Value   Hemoglobin 10.7 (*)    MCH 25.9 (*)    MCHC 29.2 (*)    RDW 18.2 (*)    All other components within normal limits  BRAIN NATRIURETIC PEPTIDE - Abnormal; Notable for the following components:   B Natriuretic Peptide >4,500.0 (*)    All other components within normal limits  COMPREHENSIVE METABOLIC PANEL - Abnormal; Notable for the following components:   Potassium 5.9 (*)    CO2 19 (*)    BUN 75 (*)    Creatinine, Ser 8.68 (*)    Total Protein 8.5 (*)    Albumin 2.5 (*)    Alkaline Phosphatase 164 (*)    GFR, Estimated 6 (*)    Anion gap 18 (*)    All other components within normal limits  I-STAT CHEM 8,  ED - Abnormal; Notable for the following components:   Potassium 6.0 (*)    BUN 69 (*)    Creatinine, Ser 10.30 (*)    All other components within normal limits  CBG MONITORING, ED - Abnormal; Notable for the following  components:   Glucose-Capillary 53 (*)    All other components within normal limits  CBG MONITORING, ED - Abnormal; Notable for the following components:   Glucose-Capillary 64 (*)    All other components within normal limits  TROPONIN I (HIGH SENSITIVITY) - Abnormal; Notable for the following components:   Troponin I (High Sensitivity) 148 (*)    All other components within normal limits  RESPIRATORY PANEL BY RT PCR (FLU A&B, COVID)  GLUCOSE, RANDOM  TROPONIN I (HIGH SENSITIVITY)    EKG EKG Interpretation  Date/Time:  Saturday November 02 2020 21:12:27 EDT Ventricular Rate:  104 PR Interval:    QRS Duration: 93 QT Interval:  437 QTC Calculation: 575 R Axis:   104 Text Interpretation: Sinus or ectopic atrial tachycardia Prolonged PR interval Left atrial enlargement Borderline right axis deviation LVH w/ repol abnormalities, possible ischemia Prolonged QT interval when compared to prior, similar sharp t waves. No STEMI Confirmed by Antony Blackbird [phone]) on 11/02/2020 9:14:20 PM   Radiology DG Chest Port 1 View  Result Date: 11/02/2020 CLINICAL DATA:  Chest pain shortness of breath EXAM: PORTABLE CHEST 1 VIEW COMPARISON:  July 31, 2020 FINDINGS: The heart size and mediastinal contours are mildly enlarged. Aortic knob calcifications are seen. Diffusely increased interstitial markings are seen throughout both lungs. A small left pleural effusion is present. A left-sided central venous catheter seen with the tip at the cavoatrial junction. IMPRESSION: Mild cardiomegaly and interstitial edema. Small left pleural effusion. Electronically Signed   By: Prudencio Pair M.D.   On: 11/02/2020 21:49    Procedures Procedures (including critical care time)  Medications Ordered  in ED Medications  Chlorhexidine Gluconate Cloth 2 % PADS 6 each (has no administration in time range)  calcium acetate (PHOSLO) capsule 2,668 mg (has no administration in time range)  multivitamin (RENA-VIT) tablet 1 tablet (has no administration in time range)  calcitRIOL (ROCALTROL) capsule 2 mcg (has no administration in time range)  doxercalciferol (HECTOROL) injection 2 mcg (has no administration in time range)  calcium gluconate inj 10% (1 g) URGENT USE ONLY! (1 g Intravenous Given 11/02/20 2138)  insulin aspart (novoLOG) injection 5 Units (5 Units Intravenous Given 11/02/20 2137)    And  dextrose 50 % solution 50 mL (50 mLs Intravenous Given 11/02/20 2142)  dextrose 50 % solution 50 mL (50 mLs Intravenous Given 11/02/20 2242)    ED Course  I have reviewed the triage vital signs and the nursing notes.  Pertinent labs & imaging results that were available during my care of the patient were reviewed by me and considered in my medical decision making (see chart for details).    MDM Rules/Calculators/A&P                          The patient is a 39yo female, PMH SLE, ESRD on HD TTS who presents to the ED for SOB.  On my initial evaluation, the patient is hemodynamically stable, afebrile, nontoxic-appearing. Physical exam remarkable for tachypnea on 3L O2 [place].  Differentials considered include CHF exacerbation, fluid overload from missed dialysis, electrolyte abnormality. I am most concerned for hyperkalemia. EKG with peaked T waves, similar to prior EKG from August when she had hyperkalemia greater than 7.5.  IV calcium, insulin, and dextrose provided for concern for hyperkalemia and consulted nephrology for urgent dialysis.  On reevaluation, patient complaining of nausea.  Glucose low at 53, provided amp of D50 IV.  Consulted hospitalist for admission  for patient's hyperkalemia and fluid overload causing acute hypoxic respiratory failure.  Patient accepted to their service.  The care of  this patient was overseen by Dr. Sherry Ruffing, who agreed with evaluation and plan of care.   Final Clinical Impression(s) / ED Diagnoses Final diagnoses:  Hyperkalemia  Acute hypoxemic respiratory failure [facility])    Rx / DC Orders ED Discharge Orders    None       Launa Flight, MD 11/02/20 [place], Gwenyth Allegra, MD 11/04/20 [phone]

## 2020-11-02 NOTE — Consult Note (Signed)
Renal Service Consult Note [facility]  [name] 11/02/2020 [name] Blazing, MD Requesting Physician: Dr Sherry Ruffing  Reason for Consult: ESRD pt missed HD HPI: The patient is a 39 y.o. year-old w/ hx fo SLE, seizures, PNA, pericarditis (2014), ESRD on HD, chronic syst CHF, anemia who missed HD this am and presents to ED w/ SOB.  In ED sats were in the 60's on presentation, improved to 100% sat on 3L .  CXR pending. EKG peaked T's, K+ 6.0. Asked to see for dialysis.   Pt is TTS, on HD x approx 10 yrs, ESRD due to SLE.  No recent SLE issues.  No chest pain, n/v/d or abd pain.  Has new RUA AVF placed early August, she says they are supposed to start using it soon.  Has L IJ TDC.  Good compliance w/ HD in genreal.   ROS  denies CP  no joint pain   no HA  no blurry vision  no rash  no diarrhea  no nausea/ vomiting     Past Medical History  Past Medical History:  Diagnosis Date  . Anemia    low iron - receives iron at dialysis  . Chronic systolic congestive heart failure ([place]) 03/16/2016  . Dyspnea   . ESRD (end stage renal disease) ([place])    Hemo TTHSAT _ [place]  . H/O pericarditis 01/17/2013  . H/O pleural effusion 01/17/2013  . Heart murmur   . Lupus (systemic lupus erythematosus) ([facility])    Previously followed with Dr. Charlestine Night, has not followed up recently  . 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 Pneumonia    many times  . Polysubstance abuse ([place])    cocaine, MJ, tobacco  . S/P pericardiocentesis 01/17/2013   H/o pericardial effusion with tamponade 2006   . Seizures ([place])    during pregnancy 1 time  . Streptococcal bacteremia 01/23/2013   She had two S. pneumonae bacteremia on 01/21/2013. Sensitive to Peniccilin    Past Surgical History  Past Surgical History:  Procedure Laterality Date  . AV  FISTULA PLACEMENT    . AV FISTULA PLACEMENT Right 08/01/2020   Procedure: RIGHT ARM BRACHIOCEPHALIC  ARTERIOVENOUS (AV) FISTULA CREATION;  Surgeon: [name], MD;  Location: [place];  Service: Vascular;  Laterality: Right;  . BASCILIC VEIN TRANSPOSITION Left 02/05/2014   Procedure: [place];  Surgeon: [name], MD;  Location: [place];  Service: Vascular;  Laterality: Left;  . FISTULA SUPERFICIALIZATION Left 05/30/2018   Procedure: FISTULA PLICATION BASILIC VEIN TRANSPOSITION;  Surgeon: Angelia Mould, MD;  Location: [place];  Service: Vascular;  Laterality: Left;  . FISTULA SUPERFICIALIZATION Left 76/19/5093   Procedure: PLICATION OF LEFT ARTERIOVENOUS FISTULA ULCER;  Surgeon: [name], MD;  Location: [place];  Service: Vascular;  Laterality: Left;  . INSERTION OF DIALYSIS CATHETER N/A 05/19/2020   Procedure: TUNNELED INSERTION  OF DIALYSIS CATHETER;  Surgeon: Waynetta Sandy, MD;  Location: [place];  Service: Vascular;  Laterality: N/A;  . THROMBECTOMY AND REVISION OF ARTERIOVENTOUS (AV) GORETEX  GRAFT Left 07/28/2020   Procedure: Oversewing of left arm Brachial cephalic fistula for bleeding.;  Surgeon: Angelia Mould, MD;  Location: [place];  Service: Vascular;  Laterality: Left;  Marland Kitchen VENOGRAM Right 01/31/2014   Procedure: DIALYSIS CATHETER;  Surgeon: Serafina Mitchell, MD;  Location: [facility] CATH  LAB;  Service: Cardiovascular;  Laterality: Right;   Family History History reviewed. No pertinent family history. Social History  reports that she has been smoking cigarettes. She has a 1.80 pack-year smoking history. She has never used smokeless tobacco. She reports current alcohol use. She reports current drug use. Frequency: 5.00 times per week. Drug: Marijuana. Allergies  Allergies  Allergen Reactions  . Tobramycin Sulfate Swelling    Eye swelling   Home medications Prior to Admission medications   Medication Sig Start Date End Date Taking? Authorizing Provider   calcium acetate (PHOSLO) 667 MG capsule Take 1,334-2,668 mg by mouth See admin instructions. Take 4 capsule (2668 mg) by mouth three times a day with each meal and 2 capsules (1334 mg) with each snack    [provider]  camphor-menthol (SARNA) lotion Apply topically as needed for itching. 08/02/20   Kayleen Memos, DO  carvedilol (COREG) 12.5 MG tablet Take 12.5 mg by mouth 2 (two) times daily. Take one tablet (12.5 mg) by mouth every morning, take one tablet (12.5 mg) at night on Sunday, Monday, Wednesday, Friday 11/07/19   [provider]  cinacalcet (SENSIPAR) 30 MG tablet Take 30 mg by mouth daily.    [provider]  diphenhydrAMINE (BENADRYL) 25 MG tablet Take 25 mg by mouth at bedtime.     [provider]  heparin 1000 unit/mL SOLN injection 1,000 Units by Dialysis route one time in dialysis.  05/21/20 06/05/21  [provider]  hydrOXYzine (ATARAX/VISTARIL) 10 MG tablet Take 1 tablet (10 mg total) by mouth 3 (three) times daily as needed for anxiety. Patient taking differently: Take 10 mg by mouth 3 (three) times daily as needed for itching or anxiety.  07/29/19   Caccavale, Sophia, PA-C  loperamide (IMODIUM A-D) 2 MG tablet Take 4 mg by mouth daily.     [provider]  Methoxy PEG-Epoetin Beta (MIRCERA IJ) Inject 1 Dose as directed every 14 (fourteen) days. 03/16/20 03/15/21  [provider]  Nutritional Supplements (NOVASOURCE RENAL) LIQD Take 237 mLs by mouth See admin instructions. Take 1 container (237 mls) by at bedtime on dialysis days (Tuesday, Thursday, Saturday)    [provider]     Vitals:   11/02/20 2012 11/02/20 2018 11/02/20 2033  BP: (!) 128/97  132/73  Pulse: 80  (!) 102  Resp: 20  (!) 22  Temp: 98.1 F (36.7 C)  97.7 F (36.5 C)  TempSrc: Oral  Oral  SpO2: (!) 63% 100% 100%  Weight:   47.9 kg  Height:   5\' 6"  (1.676 m)   Exam Gen thin young adult AAF, 3 L [place], mild ^wob No rash, cyanosis or  gangrene Sclera anicteric, throat not seen No jvd or bruits Chest slight basilar rales, mostly clear Cor RRR 2/6 sem, no RG Abd soft ntnd no mass or ascites +bs  GU defer MS no joint effusions or deformity Ext no pretib edema, no wounds or ulcer Neuro is alert, Ox 3 , nf   RUA AVF +bruit, old occluded L AVF  L IJ TDC   Home meds:  - phoslo 4 ac/ renavit  - coreg 12.5 bid  - prn's/ vitamins/ supplements    [place]  3h 22min   400/600   45.5kg  2/2 bath  TDC/ maturing R AVF (08/01/20)  Hep 2000  - calc 2.0 tiw  - mircera 150ug q2, last 10/12, last Hb 9.8 10/4  - venofer 100 / wk (x  12 so far)  - hect 2 ug   Assessment/ Plan: 1. SOB/ hypoxemia / acute resp failure - likely due to pulm edema, vol overload from missed HD. CXR pending. Plan HD this evening upstairs asap. Pt is for admission per ED provider.  2. Hyperkalemia - K 6.0, peaked T's on EKG 3. ESRD - missed HD this am. Compliance is generally good. TTS. HD as above 4. SLE - no recent issues 5. HTN - takes coreg only 6. Anemia - last Hb 9.8, esa in OP setting 7. MBD ckd - cont binder      Rob Leyton Brownlee  MD 11/02/2020, 9:25 PM  No results for input(s): WBC, HGB in the last 168 hours. No results for input(s): K, BUN, CREATININE, CALCIUM, PHOS in the last 168 hours.

## 2020-11-02 NOTE — ED Notes (Signed)
X-ray at bedside

## 2020-11-02 NOTE — ED Notes (Signed)
Put patient on 3L of O2. O2 was 62 on room air. Will reassess in 62mins.

## 2020-11-02 NOTE — ED Triage Notes (Signed)
Brought to ED by mother.  Pt overslept this morning and missed dialysis, no available appts this afternoon.  Pt c/o shortness of breath.  SpO2 63% on room air, pt placed on O2 @ 3L via [place], SpO2 increased to 100%.

## 2020-11-02 NOTE — ED Notes (Signed)
Pt aao4, gcs20, reporting increased sob after missing dialysis today. Pt usually goes t, th, Saturday. States on Thursday having a smaller amount of fluids removed compared to her usual and that she over slept and missed todays appointment. Right arm restrict. Audible wheezing noted, 2-3 word sentences when pt speaking, on 3 liters [place], other vss on monitor. Side rails up, call bell in reach.

## 2020-11-02 NOTE — ED Notes (Signed)
md at bedside

## 2020-11-03 ENCOUNTER — Observation Stay (HOSPITAL_COMMUNITY): Payer: Medicaid Other

## 2020-11-03 ENCOUNTER — Other Ambulatory Visit: Payer: Self-pay

## 2020-11-03 DIAGNOSIS — I5042 Chronic combined systolic (congestive) and diastolic (congestive) heart failure: Secondary | ICD-10-CM | POA: Diagnosis not present

## 2020-11-03 DIAGNOSIS — Z992 Dependence on renal dialysis: Secondary | ICD-10-CM | POA: Diagnosis not present

## 2020-11-03 DIAGNOSIS — I351 Nonrheumatic aortic (valve) insufficiency: Secondary | ICD-10-CM

## 2020-11-03 DIAGNOSIS — N186 End stage renal disease: Secondary | ICD-10-CM | POA: Diagnosis present

## 2020-11-03 DIAGNOSIS — I340 Nonrheumatic mitral (valve) insufficiency: Secondary | ICD-10-CM | POA: Diagnosis not present

## 2020-11-03 DIAGNOSIS — E875 Hyperkalemia: Secondary | ICD-10-CM

## 2020-11-03 DIAGNOSIS — D649 Anemia, unspecified: Secondary | ICD-10-CM | POA: Diagnosis present

## 2020-11-03 DIAGNOSIS — I248 Other forms of acute ischemic heart disease: Secondary | ICD-10-CM | POA: Diagnosis present

## 2020-11-03 DIAGNOSIS — I080 Rheumatic disorders of both mitral and aortic valves: Secondary | ICD-10-CM | POA: Diagnosis present

## 2020-11-03 DIAGNOSIS — I429 Cardiomyopathy, unspecified: Secondary | ICD-10-CM | POA: Diagnosis present

## 2020-11-03 DIAGNOSIS — Z9115 Patient's noncompliance with renal dialysis: Secondary | ICD-10-CM | POA: Diagnosis not present

## 2020-11-03 DIAGNOSIS — Z79899 Other long term (current) drug therapy: Secondary | ICD-10-CM | POA: Diagnosis not present

## 2020-11-03 DIAGNOSIS — R011 Cardiac murmur, unspecified: Secondary | ICD-10-CM | POA: Diagnosis present

## 2020-11-03 DIAGNOSIS — I5043 Acute on chronic combined systolic (congestive) and diastolic (congestive) heart failure: Secondary | ICD-10-CM | POA: Diagnosis present

## 2020-11-03 DIAGNOSIS — I361 Nonrheumatic tricuspid (valve) insufficiency: Secondary | ICD-10-CM

## 2020-11-03 DIAGNOSIS — I120 Hypertensive chronic kidney disease with stage 5 chronic kidney disease or end stage renal disease: Secondary | ICD-10-CM | POA: Diagnosis not present

## 2020-11-03 DIAGNOSIS — I132 Hypertensive heart and chronic kidney disease with heart failure and with stage 5 chronic kidney disease, or end stage renal disease: Secondary | ICD-10-CM | POA: Diagnosis present

## 2020-11-03 DIAGNOSIS — I342 Nonrheumatic mitral (valve) stenosis: Secondary | ICD-10-CM

## 2020-11-03 DIAGNOSIS — I471 Supraventricular tachycardia: Secondary | ICD-10-CM | POA: Diagnosis present

## 2020-11-03 DIAGNOSIS — F17210 Nicotine dependence, cigarettes, uncomplicated: Secondary | ICD-10-CM | POA: Diagnosis present

## 2020-11-03 DIAGNOSIS — R0602 Shortness of breath: Secondary | ICD-10-CM

## 2020-11-03 DIAGNOSIS — N898 Other specified noninflammatory disorders of vagina: Secondary | ICD-10-CM | POA: Diagnosis present

## 2020-11-03 DIAGNOSIS — Z20822 Contact with and (suspected) exposure to covid-19: Secondary | ICD-10-CM | POA: Diagnosis present

## 2020-11-03 DIAGNOSIS — M329 Systemic lupus erythematosus, unspecified: Secondary | ICD-10-CM | POA: Diagnosis present

## 2020-11-03 DIAGNOSIS — I5023 Acute on chronic systolic (congestive) heart failure: Secondary | ICD-10-CM | POA: Diagnosis not present

## 2020-11-03 DIAGNOSIS — Z888 Allergy status to other drugs, medicaments and biological substances status: Secondary | ICD-10-CM | POA: Diagnosis not present

## 2020-11-03 DIAGNOSIS — J9601 Acute respiratory failure with hypoxia: Secondary | ICD-10-CM | POA: Diagnosis present

## 2020-11-03 DIAGNOSIS — I4891 Unspecified atrial fibrillation: Secondary | ICD-10-CM | POA: Diagnosis present

## 2020-11-03 DIAGNOSIS — F1490 Cocaine use, unspecified, uncomplicated: Secondary | ICD-10-CM | POA: Diagnosis present

## 2020-11-03 LAB — COMPREHENSIVE METABOLIC PANEL
ALT: 30 U/L (ref 0–44)
AST: 40 U/L (ref 15–41)
Albumin: 2.7 g/dL — ABNORMAL LOW (ref 3.5–5.0)
Alkaline Phosphatase: 181 U/L — ABNORMAL HIGH (ref 38–126)
Anion gap: 10 (ref 5–15)
BUN: 12 mg/dL (ref 6–20)
CO2: 27 mmol/L (ref 22–32)
Calcium: 8.8 mg/dL — ABNORMAL LOW (ref 8.9–10.3)
Chloride: 95 mmol/L — ABNORMAL LOW (ref 98–111)
Creatinine, Ser: 2.45 mg/dL — ABNORMAL HIGH (ref 0.44–1.00)
GFR, Estimated: 26 mL/min — ABNORMAL LOW (ref >60)
Glucose, Bld: 79 mg/dL (ref 70–99)
Potassium: 3.1 mmol/L — ABNORMAL LOW (ref 3.5–5.1)
Sodium: 132 mmol/L — ABNORMAL LOW (ref 135–145)
Total Bilirubin: 0.4 mg/dL (ref 0.3–1.2)
Total Protein: 9.8 g/dL — ABNORMAL HIGH (ref 6.5–8.1)

## 2020-11-03 LAB — ECHOCARDIOGRAM COMPLETE
AR max vel: 3.24 cm2
AV Area VTI: 3.26 cm2
AV Area mean vel: 3.16 cm2
AV Mean grad: 7.3 mmHg
AV Peak grad: 14 mmHg
Ao pk vel: 1.87 m/s
Calc EF: 35.8 %
Height: 66 in
MV M vel: 5.47 m/s
MV Peak grad: 119.7 mmHg
P 1/2 time: 474 msec
Radius: 0.8 cm
S' Lateral: 5.5 cm
Single Plane A2C EF: 32.7 %
Single Plane A4C EF: 36.5 %
Weight: 1688.55 oz

## 2020-11-03 LAB — CBC
HCT: 35.8 % — ABNORMAL LOW (ref 36.0–46.0)
Hemoglobin: 10.9 g/dL — ABNORMAL LOW (ref 12.0–15.0)
MCH: 26.3 pg (ref 26.0–34.0)
MCHC: 30.4 g/dL (ref 30.0–36.0)
MCV: 86.3 fL (ref 80.0–100.0)
Platelets: 301 10*3/uL (ref 150–400)
RBC: 4.15 MIL/uL (ref 3.87–5.11)
RDW: 17.8 % — ABNORMAL HIGH (ref 11.5–15.5)
WBC: 4.9 10*3/uL (ref 4.0–10.5)
nRBC: 0 % (ref 0.0–0.2)

## 2020-11-03 LAB — TROPONIN I (HIGH SENSITIVITY): Troponin I (High Sensitivity): 149 ng/L (ref <18)

## 2020-11-03 MED ORDER — HEPARIN SODIUM (PORCINE) 1000 UNIT/ML IJ SOLN
INTRAMUSCULAR | Status: AC
  Filled 2020-11-03: qty 2

## 2020-11-03 MED ORDER — MUSCLE RUB 10-15 % EX CREA
TOPICAL_CREAM | CUTANEOUS | Status: DC | PRN
  Filled 2020-11-03: qty 85

## 2020-11-03 MED ORDER — HEPARIN SODIUM (PORCINE) 1000 UNIT/ML DIALYSIS
2000.0000 [IU] | Freq: Once | INTRAMUSCULAR | Status: AC
  Administered 2020-11-03: 2000 [IU] via INTRAVENOUS_CENTRAL

## 2020-11-03 NOTE — Progress Notes (Signed)
New Admission Note: ? Arrival Method: Stretcher  Mental Orientation:  Alert and Oriented X 4 Telemetry: Box 20 Assessment: Completed Skin: Refer to flowsheet IV: Left forearm  Pain: 0/10 Tubes: None  Safety Measures: Safety Fall Prevention Plan discussed with patient. Admission: Completed 5 [place] Orientation: Patient has been orientated to the room, unit and the staff. Family: None  Orders have been reviewed and are being implemented. Will continue to monitor the patient. Call light has been placed within reach and bed alarm has been activated.  ? Milagros Loll, RN  Phone Number: [phone]

## 2020-11-03 NOTE — Progress Notes (Signed)
  Echocardiogram 2D Echocardiogram has been performed.  [name] 11/03/2020, 9:22 AM

## 2020-11-03 NOTE — Progress Notes (Addendum)
[facility] Progress Note   Subjective: Seen in room, lying in bed. Says she is tired but breathing better. Denies SOB   Objective Vitals:   11/03/20 0555 11/03/20 0612 11/03/20 0923 11/03/20 0948  BP: (!) 163/96  (!) 147/87 (!) 153/85  Pulse: 96 95 93 91  Resp: 18   18  Temp: (!) 97.5 F (36.4 C)  98.4 F (36.9 C) 98.7 F (37.1 C)  TempSrc: Oral  Oral Oral  SpO2: (!) 89% 94% 94% 94%  Weight:      Height:       Physical Exam General: Thin female in NAD Heart: Z6,X0 2/6 systolic M. No R/G. No JVD.  Lungs: CTAB slightly decreased in bases.  Abdomen: S, NT Active BS Extremities: No LE edema Dialysis Access: LIJ TDC Drsg intact.     Additional Objective Labs: Basic Metabolic Panel: Recent Labs  Lab 11/02/20 2114 11/02/20 2114 11/02/20 2121 11/02/20 2137 11/03/20 0450  NA 136  --   --  139 132*  K 5.9*  --   --  6.0* 3.1*  CL 99  --   --  102 95*  CO2 19*  --   --   --  27  GLUCOSE 84   < > 85 89 79  BUN 75*  --   --  69* 12  CREATININE 8.68*  --   --  10.30* 2.45*  CALCIUM 9.7  --   --   --  8.8*   < > = values in this interval not displayed.   Liver Function Tests: Recent Labs  Lab 11/02/20 2114 11/03/20 0450  AST 38 40  ALT 27 30  ALKPHOS 164* 181*  BILITOT 0.7 0.4  PROT 8.5* 9.8*  ALBUMIN 2.5* 2.7*   No results for input(s): LIPASE, AMYLASE in the last 168 hours. CBC: Recent Labs  Lab 11/02/20 2114 11/02/20 2137 11/03/20 0450  WBC 5.0  --  4.9  NEUTROABS 3.0  --   --   HGB 10.7* 12.2 10.9*  HCT 36.6 36.0 35.8*  MCV 88.6  --  86.3  PLT 292  --  301   Blood Culture    Component Value Date/Time   SDES BLOOD RIGHT ARM 07/31/2020 1244   SPECREQUEST  07/31/2020 1244    BOTTLES DRAWN AEROBIC ONLY Blood Culture results may not be optimal due to an inadequate volume of blood received in culture bottles   CULT  07/31/2020 1244    NO GROWTH 5 DAYS Performed at [facility], [place] [address], [place], [place] [address]     REPTSTATUS 08/05/2020 FINAL 07/31/2020 1244    Cardiac Enzymes: No results for input(s): CKTOTAL, CKMB, CKMBINDEX, TROPONINI in the last 168 hours. CBG: Recent Labs  Lab 11/02/20 2226 11/02/20 2311  GLUCAP 53* 64*   Iron Studies: No results for input(s): IRON, TIBC, TRANSFERRIN, FERRITIN in the last 72 hours. @lablastinr3 @ Studies/Results: DG Chest Port 1 View  Result Date: 11/02/2020 CLINICAL DATA:  Chest pain shortness of breath EXAM: PORTABLE CHEST 1 VIEW COMPARISON:  July 31, 2020 FINDINGS: The heart size and mediastinal contours are mildly enlarged. Aortic knob calcifications are seen. Diffusely increased interstitial markings are seen throughout both lungs. A small left pleural effusion is present. A left-sided central venous catheter seen with the tip at the cavoatrial junction. IMPRESSION: Mild cardiomegaly and interstitial edema. Small left pleural effusion. Electronically Signed   By: Prudencio Pair M.D.   On: 11/02/2020 21:49   Medications:  Marland Kitchen [  START ON 11/05/2020] calcitRIOL  2 mcg Oral Q T,Th,Sa-HD  . calcium acetate  2,668 mg Oral TID WC  . carvedilol  12.5 mg Oral BID WC  . Chlorhexidine Gluconate Cloth  6 each Topical Q0600  . [START ON 11/05/2020] doxercalciferol  2 mcg Intravenous Q T,Th,Sa-HD  . heparin  5,000 Units Subcutaneous Q8H  . multivitamin  1 tablet Oral QHS  . sodium chloride flush  3 mL Intravenous Q12H     [facility] HD: [facility]  3h 32min   400/600   45.5kg  2/2 bath  TDC/ maturing R AVF (08/01/20)  Hep 2000  - calc 2.0 mcg PO tiw  - mircera 150ug q 2 weeks, last 10/12, last Hb 9.8 10/4  - venofer 100 / wk (x 12 so far)    Assessment/ Plan: 1. SOB/ hypoxemia / acute resp failure in setting of noncompliance with HD. She has attended HD each treatment but truncated treatments. CXR 11/06 with mild interstitial edema. HD over night. Net UF 3175 no post wt-getting weight now.  Is down to dry wt, on RA. Repeat CXR, if still wet plan extra HD tomorrow. Pt's echo  w/ lower EF 30%, was 45%. Have d/w attending.  2. Hyperkalemia - K 6.0, peaked T's on EKG. Resolved with HD, K+ now low BUT LABS DRAWN IMMEDIATELY POST HD. Do not replete K+!!! 3. ESRD - T,Th, S via TDC.Marland Kitchen Noncompliance with HD. Comes to every treatment but signs off early. Missed HD 11/06. HD early  this AM. Next HD 11/05/2020. 4. SLE - no recent issues 5. HTN - takes coreg only 6. Anemia - last Hb 10.9.  esa in OP setting 7. MBD ckd - cont binder 8. Noncompliance with HD: This is a longstanding issue with this patient. Long discussion about need to complete treatments, watch volume.    Rita H. Brown NP-C 11/03/2020, 10:07 AM  [facility] [phone]  Pt seen, examined and agree w assess/plan as above with additions as indicated.  [facility] 11/03/2020, 1:17 PM

## 2020-11-03 NOTE — Plan of Care (Signed)
  Problem: Education: Goal: Knowledge of General Education information will improve Description Including pain rating scale, medication(s)/side effects and non-pharmacologic comfort measures Outcome: Progressing   

## 2020-11-03 NOTE — Progress Notes (Signed)
PROGRESS NOTE    [name]  YPP:[id number] DOB: [birth year] DOA: 11/02/2020 PCP: Patient, No Pcp Per    Chief Complaint  Patient presents with  . Shortness of Breath    Brief Narrative:   39 year old lady prior history of end-stage renal disease on dialysis TTS, combined systolic and diastolic heart failure, history of polysubstance abuse, history of lupus presents with progressive dyspnea.  She reports missing her dialysis the previous session.  On arrival to ED she was found to be hypoxic requiring up to 3 L of nasal cannula oxygen to keep sats greater than 90%. She underwent dialysis last night and her breathing has improved.  She is weaned off oxygen.  Patient seen and examined this morning she reports occasional chest pressure on exertion, breathing has improved but but she reports not being back to her baseline yet. An echocardiogram done earlier this morning showed left ventricular ejection fraction decreased from 45% to 30 to 35% this admission, moderately decreased function with global hypokinesis and moderate left ventricular hypertrophy.  Left ventricular diastolic parameters are indeterminate.  Her left atrial is severely dilated and right atrial was moderately dilated. We will request cardiology evaluation in the morning for optimization of medications in addition to hemodialysis for fluid management.   Assessment & Plan:   Principal Problem:   Acute respiratory failure with hypoxia (HCC) Active Problems:   Benign hypertension with ESRD (end-stage renal disease) (HCC)   Chronic combined systolic (congestive) and diastolic (congestive) heart failure (HCC)   ESRD (end stage renal disease) on dialysis (HCC)   Acute hyperkalemia   Polysubstance abuse (HCC)  Fluid overload probably secondary to missed HD/hyperkalemia She initially was hypoxic and required up to 3 L of nasal cannula oxygen.  Postdialysis she currently she is on room air with good sats.  Echocardiogram  showed decreased left ventricular ejection fraction compared to 2018 echo. Volume management as per dialysis. Cardiology will be consulted for optimization of meds.    Hyperkalemia Resolved.    Occasional chest pressure on exertion: Patient would benefit from cardiology evaluation   Elevated troponins Probably from demand ischemia from fluid overload.   History of polysubstance abuse Patient denies using cocaine this admission.    End-stage renal disease on dialysis Counseled on being compliant with hemodialysis sessions.    DVT prophylaxis: Heparin Code Status: Full code Family Communication: None at bedside  disposition:   Status is: Observation  The patient will require care spanning > 2 midnights and should be moved to inpatient because: Hemodynamically unstable, Ongoing diagnostic testing needed not appropriate for outpatient work up, Unsafe d/c plan and Inpatient level of care appropriate due to severity of illness  Dispo: The patient is from: Home              Anticipated d/c is to: Home              Anticipated d/c date is: 2 days              Patient currently is not medically stable to d/c.       Consultants:   Nephrology  Cardiology will be consulted in the morning  Procedures: Echocardiogram Antimicrobials: None  Subjective: Patient reports occasional chest pressure on exertion  Objective: Vitals:   11/03/20 0923 11/03/20 0948 11/03/20 1016 11/03/20 1022  BP: (!) 147/87 (!) 153/85    Pulse: 93 91    Resp:  18    Temp: 98.4 F (36.9 C) 98.7 F (37.1 C)  TempSrc: Oral Oral    SpO2: 94% 94%    Weight:   45.9 kg   Height:   5\' 6"  (1.676 m) 5\' 6"  (1.676 m)    Intake/Output Summary (Last 24 hours) at 11/03/2020 1347 Last data filed at 11/03/2020 0600 Gross per 24 hour  Intake 240 ml  Output 3175 ml  Net -2935 ml   Filed Weights   11/02/20 2033 11/03/20 1016  Weight: 47.9 kg 45.9 kg    Examination:  General exam: appears calm  not in distress.  Respiratory system: Diminished air entry at bases, patient is on room air no tachypnea, no wheezing heard Cardiovascular system: S1-S2 heard, regular rate rhythm, no JVD, no pedal edema Gastrointestinal system: Abdomen is soft, nontender bowel sounds normal Central nervous system: Alert and oriented. No focal neurological deficits. Extremities: Symmetric 5 x 5 power. Skin: No rashes, lesions or ulcers Psychiatry: Mood & affect appropriate.       Data Reviewed: I have personally reviewed following labs and imaging studies  CBC: Recent Labs  Lab 11/02/20 2114 11/02/20 2137 11/03/20 0450  WBC 5.0  --  4.9  NEUTROABS 3.0  --   --   HGB 10.7* 12.2 10.9*  HCT 36.6 36.0 35.8*  MCV 88.6  --  86.3  PLT 292  --  607    Basic Metabolic Panel: Recent Labs  Lab 11/02/20 2114 11/02/20 2121 11/02/20 2137 11/03/20 0450  NA 136  --  139 132*  K 5.9*  --  6.0* 3.1*  CL 99  --  102 95*  CO2 19*  --   --  27  GLUCOSE 84 85 89 79  BUN 75*  --  69* 12  CREATININE 8.68*  --  10.30* 2.45*  CALCIUM 9.7  --   --  8.8*    GFR: Estimated Creatinine Clearance: 23 mL/min (A) (by C-G formula based on SCr of 2.45 mg/dL (H)).  Liver Function Tests: Recent Labs  Lab 11/02/20 2114 11/03/20 0450  AST 38 40  ALT 27 30  ALKPHOS 164* 181*  BILITOT 0.7 0.4  PROT 8.5* 9.8*  ALBUMIN 2.5* 2.7*    CBG: Recent Labs  Lab 11/02/20 2226 11/02/20 2311  GLUCAP 53* 64*     Recent Results (from the past 240 hour(s))  Respiratory Panel by RT PCR (Flu A&B, Covid) - Nasopharyngeal Swab     Status: None   Collection Time: 11/02/20 10:58 PM   Specimen: Nasopharyngeal Swab  Result Value Ref Range Status   SARS Coronavirus 2 by RT PCR NEGATIVE NEGATIVE Final    Comment: (NOTE) SARS-CoV-2 target nucleic acids are NOT DETECTED.  The SARS-CoV-2 RNA is generally detectable in upper respiratoy specimens during the acute phase of infection. The lowest concentration of SARS-CoV-2  viral copies this assay can detect is 131 copies/mL. A negative result does not preclude SARS-Cov-2 infection and should not be used as the sole basis for treatment or other patient management decisions. A negative result may occur with  improper specimen collection/handling, submission of specimen other than nasopharyngeal swab, presence of viral mutation(s) within the areas targeted by this assay, and inadequate number of viral copies (<131 copies/mL). A negative result must be combined with clinical observations, patient history, and epidemiological information. The expected result is Negative.  Fact Sheet for Patients:  PinkCheek.be  Fact Sheet for Healthcare Providers:  GravelBags.it  This test is no t yet approved or cleared by the [place] and  has been authorized for  detection and/or diagnosis of SARS-CoV-2 by FDA under an Emergency Use Authorization (EUA). This EUA will remain  in effect (meaning this test can be used) for the duration of the COVID-19 declaration under Section 564(b)(1) of the Act, 21 U.S.C. section 360bbb-3(b)(1), unless the authorization is terminated or revoked sooner.     Influenza A by PCR NEGATIVE NEGATIVE Final   Influenza B by PCR NEGATIVE NEGATIVE Final    Comment: (NOTE) The Xpert Xpress SARS-CoV-2/FLU/RSV assay is intended as an aid in  the diagnosis of influenza from Nasopharyngeal swab specimens and  should not be used as a sole basis for treatment. Nasal washings and  aspirates are unacceptable for Xpert Xpress SARS-CoV-2/FLU/RSV  testing.  Fact Sheet for Patients: PinkCheek.be  Fact Sheet for Healthcare Providers: GravelBags.it  This test is not yet approved or cleared by the [place] FDA and  has been authorized for detection and/or diagnosis of SARS-CoV-2 by  FDA under an Emergency Use Authorization (EUA).  This EUA will remain  in effect (meaning this test can be used) for the duration of the  Covid-19 declaration under Section 564(b)(1) of the Act, 21  U.S.C. section 360bbb-3(b)(1), unless the authorization is  terminated or revoked. Performed at [facility], [place] [address], [place], [place] [address]          Radiology Studies: DG Chest Port 1 View  Result Date: 11/02/2020 CLINICAL DATA:  Chest pain shortness of breath EXAM: PORTABLE CHEST 1 VIEW COMPARISON:  July 31, 2020 FINDINGS: The heart size and mediastinal contours are mildly enlarged. Aortic knob calcifications are seen. Diffusely increased interstitial markings are seen throughout both lungs. A small left pleural effusion is present. A left-sided central venous catheter seen with the tip at the cavoatrial junction. IMPRESSION: Mild cardiomegaly and interstitial edema. Small left pleural effusion. Electronically Signed   By: Prudencio Pair M.D.   On: 11/02/2020 21:49   ECHOCARDIOGRAM COMPLETE  Result Date: 11/03/2020    ECHOCARDIOGRAM REPORT   Patient Name:   KAMERIA CANIZARES Stahlman Date of Exam: 11/03/2020 Medical Rec #:  [id number]      Height:       66.0 in Accession #:    [id number]     Weight:       105.5 lb Date of Birth:  15-May-1981     BSA:          1.524 m Patient Age:    58 years       BP:           163/96 mmHg Patient Gender: F              HR:           98 bpm. Exam Location:  Inpatient Procedure: 2D Echo, Cardiac Doppler and Color Doppler Indications:    Dyspnea 786.09 / R06.00  History:        Patient has prior history of Echocardiogram examinations, most                 recent 02/17/2017. Signs/Symptoms:Murmur and Dyspnea; Risk                 Factors:Current Smoker. Pericardial effusion. Pleural effusion.  Sonographer:    Vickie Epley RDCS Referring Phys: [id number] [place]  1. Left ventricular ejection fraction, by estimation, is 30 to 35%. The left ventricle has moderately decreased function. The left  ventricle demonstrates global hypokinesis. The left ventricular internal cavity size was mildly dilated. There  is moderate  left ventricular hypertrophy. Left ventricular diastolic parameters are indeterminate.  2. Right ventricular systolic function is low normal. The right ventricular size is mildly enlarged. There is mildly elevated pulmonary artery systolic pressure.  3. Left atrial size was severely dilated.  4. Right atrial size was moderately dilated.  5. Severe posterior mitral anular calcfication, with a calcified linear portion protruding into the left atrium. The MR vena contracta is 0.6 cm. Primary MR etiology looks to be functional due to tethering of posterior leaflet. Moderate mitral stenosis with mean gradient of 7 mmHg. . The mitral valve is abnormal. Moderate to severe mitral valve regurgitation. Moderate mitral stenosis. Severe mitral annular calcification.  6. The aortic valve is tricuspid. There is mild calcification of the aortic valve. There is mild thickening of the aortic valve. Aortic valve regurgitation is moderate. No aortic stenosis is present. FINDINGS  Left Ventricle: Left ventricular ejection fraction, by estimation, is 30 to 35%. The left ventricle has moderately decreased function. The left ventricle demonstrates global hypokinesis. The left ventricular internal cavity size was mildly dilated. There is moderate left ventricular hypertrophy. Left ventricular diastolic parameters are indeterminate. Right Ventricle: The right ventricular size is mildly enlarged. Right vetricular wall thickness was not assessed. Right ventricular systolic function is low normal. There is mildly elevated pulmonary artery systolic pressure. The tricuspid regurgitant velocity is 2.96 m/s, and with an assumed right atrial pressure of 3 mmHg, the estimated right ventricular systolic pressure is 00.8 mmHg. Left Atrium: Left atrial size was severely dilated. Right Atrium: Right atrial size was moderately  dilated. Pericardium: There is no evidence of pericardial effusion. Mitral Valve: Severe posterior mitral anular calcfication, with a calcified linear portion protruding into the left atrium. The MR vena contracta is 0.6 cm. Primary MR etiology looks to be functional due to tethering of posterior leaflet. Moderate mitral  stenosis with mean gradient of 7 mmHg. The mitral valve is abnormal. There is moderate thickening of the mitral valve leaflet(s). There is moderate calcification of the mitral valve leaflet(s). Severe mitral annular calcification. Moderate to severe mitral valve regurgitation. Moderate mitral valve stenosis. The mean mitral valve gradient is 7.6 mmHg with average heart rate of 99 bpm. Tricuspid Valve: The tricuspid valve is normal in structure. Tricuspid valve regurgitation is mild . No evidence of tricuspid stenosis. Aortic Valve: The aortic valve is tricuspid. There is mild calcification of the aortic valve. There is mild thickening of the aortic valve. There is mild aortic valve annular calcification. Aortic valve regurgitation is moderate. Aortic regurgitation PHT  measures 474 msec. No aortic stenosis is present. Aortic valve mean gradient measures 7.3 mmHg. Aortic valve peak gradient measures 14.0 mmHg. Aortic valve area, by VTI measures 3.26 cm. Pulmonic Valve: The pulmonic valve was not well visualized. Pulmonic valve regurgitation is trivial. No evidence of pulmonic stenosis. Aorta: The aortic root is normal in size and structure. Pulmonary Artery: 35.  IAS/Shunts: No atrial level shunt detected by color flow Doppler.  LEFT VENTRICLE PLAX 2D LVIDd:         6.10 cm LVIDs:         5.50 cm LV PW:         1.30 cm LV IVS:        1.30 cm LVOT diam:     2.40 cm LV SV:         84 LV SV Index:   55 LVOT Area:     4.52 cm  LV Volumes (MOD) LV vol  d, MOD A2C: 263.0 ml LV vol d, MOD A4C: 219.0 ml LV vol s, MOD A2C: 177.0 ml LV vol s, MOD A4C: 139.0 ml LV SV MOD A2C:     86.0 ml LV SV MOD A4C:      219.0 ml LV SV MOD BP:      90.0 ml RIGHT VENTRICLE RV S prime:     10.80 cm/s TAPSE (M-mode): 1.6 cm LEFT ATRIUM              Index        RIGHT ATRIUM           Index LA diam:        4.90 cm  3.22 cm/m   RA Area:     19.30 cm LA Vol (A2C):   149.0 ml 97.77 ml/m  RA Volume:   53.20 ml  34.91 ml/m LA Vol (A4C):   161.0 ml 105.64 ml/m LA Biplane Vol: 165.0 ml 108.27 ml/m  AORTIC VALVE AV Area (Vmax):    3.24 cm AV Area (Vmean):   3.16 cm AV Area (VTI):     3.26 cm AV Vmax:           187.03 cm/s AV Vmean:          126.357 cm/s AV VTI:            0.257 m AV Peak Grad:      14.0 mmHg AV Mean Grad:      7.3 mmHg LVOT Vmax:         134.00 cm/s LVOT Vmean:        88.200 cm/s LVOT VTI:          0.185 m LVOT/AV VTI ratio: 0.72 AI PHT:            474 msec  AORTA Ao Root diam: 3.50 cm MITRAL VALVE                 TRICUSPID VALVE MV Mean grad: 7.6 mmHg       TR Peak grad:   35.0 mmHg MR Peak grad:    119.7 mmHg  TR Vmax:        296.00 cm/s MR Mean grad:    83.0 mmHg MR Vmax:         547.00 cm/s SHUNTS MR Vmean:        434.0 cm/s  Systemic VTI:  0.18 m MR PISA:         4.02 cm    Systemic Diam: 2.40 cm MR PISA Eff ROA: 29 mm MR PISA Radius:  0.80 cm Carlyle Dolly MD Electronically signed by Carlyle Dolly MD Signature Date/Time: 11/03/2020/12:04:27 PM    Final         Scheduled Meds: . Derrill Memo ON 11/05/2020] calcitRIOL  2 mcg Oral Q T,Th,Sa-HD  . calcium acetate  2,668 mg Oral TID WC  . carvedilol  12.5 mg Oral BID WC  . Chlorhexidine Gluconate Cloth  6 each Topical Q0600  . [START ON 11/05/2020] doxercalciferol  2 mcg Intravenous Q T,Th,Sa-HD  . heparin  5,000 Units Subcutaneous Q8H  . multivitamin  1 tablet Oral QHS  . sodium chloride flush  3 mL Intravenous Q12H   Continuous Infusions:   LOS: 0 days        Hosie Poisson, MD [facility]   To contact the attending provider between 7A-7P or the covering provider during after hours 7P-7A, please log into the web site [email or web address] and  access using  universal  password for that web site. If you do not have the password, please call the hospital operator.  11/03/2020, 1:47 PM

## 2020-11-04 DIAGNOSIS — N186 End stage renal disease: Secondary | ICD-10-CM

## 2020-11-04 DIAGNOSIS — Z992 Dependence on renal dialysis: Secondary | ICD-10-CM

## 2020-11-04 DIAGNOSIS — I5023 Acute on chronic systolic (congestive) heart failure: Secondary | ICD-10-CM

## 2020-11-04 DIAGNOSIS — I351 Nonrheumatic aortic (valve) insufficiency: Secondary | ICD-10-CM

## 2020-11-04 DIAGNOSIS — J9601 Acute respiratory failure with hypoxia: Secondary | ICD-10-CM | POA: Diagnosis not present

## 2020-11-04 DIAGNOSIS — I340 Nonrheumatic mitral (valve) insufficiency: Secondary | ICD-10-CM

## 2020-11-04 DIAGNOSIS — I5042 Chronic combined systolic (congestive) and diastolic (congestive) heart failure: Secondary | ICD-10-CM | POA: Diagnosis not present

## 2020-11-04 DIAGNOSIS — I342 Nonrheumatic mitral (valve) stenosis: Secondary | ICD-10-CM

## 2020-11-04 DIAGNOSIS — E875 Hyperkalemia: Secondary | ICD-10-CM | POA: Diagnosis not present

## 2020-11-04 DIAGNOSIS — I480 Paroxysmal atrial fibrillation: Secondary | ICD-10-CM

## 2020-11-04 MED ORDER — HYDRALAZINE HCL 25 MG PO TABS
25.0000 mg | ORAL_TABLET | Freq: Three times a day (TID) | ORAL | Status: DC
  Administered 2020-11-04 (×2): 25 mg via ORAL
  Filled 2020-11-04 (×3): qty 1

## 2020-11-04 NOTE — Consult Note (Signed)
Cardiology Consultation:   Patient ID: [name] MRN: [id number]; DOB: 11-11-1981  Admit date: 11/02/2020 Date of Consult: 11/04/2020  Primary Care Provider: Patient, No Pcp Per [place] Cardiologist: No primary care provider on file.  [facility] Electrophysiologist:  None    Patient Profile:   [name] is a 39 y.o. 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 Karleen Hampshire of [facility].  History of Present Illness:   [name] states her shortness of breath has improved since her admission. She notes after having dialysis session yesterday, she is not having orthopnea. She endorses another session of dialysis to occur today as suspicion she is still volume overloaded.   She notes a history of pericarditis with effusion 10-15 years ago and states this was when her lupus was diagnosed. She had a pericardiocentesis at the time. She has not had an episode since. She notes being on carvedilol for her heart years ago, but was unsure why. She states when she missed dialysis sessions she becomes short of breath and denies abdominal distension or lower extremity swelling.   She also endorses multiple episodes of SVT/Afib that occurred multiple times in a month, a year ago. She notes these occurred only during dialysis and she was sent to the hospital few times and given adenosine. She endorses feeling fluttering in her chest when she smokes cigarettes and this has led her to decrease the amount she smokes. Denies palpitations when she has used cocaine, last time she used was 3 weeks ago. Denies making urine.   She notes she is being setup with a  new PCP at this time, is not follow by rheumatology or cardiology   Past Medical History:  Diagnosis Date  . Anemia    low iron - receives iron at dialysis  . Chronic systolic congestive heart failure ([place]) 03/16/2016  . Dyspnea   . ESRD (end stage renal disease) ([place])    [facility] _ [place]  . H/O pericarditis 01/17/2013  . H/O pleural effusion 01/17/2013  . Heart murmur   . Lupus (systemic lupus erythematosus) (H[facility])    Previously followed with Dr. Charlestine Night, has not followed up recently  . 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 Pneumonia    many times  . Polysubstance abuse ([place])    cocaine, MJ, tobacco  . S/P pericardiocentesis 01/17/2013   H/o pericardial effusion with tamponade 2006   . Seizures ([place])    during pregnancy 1 time  . Streptococcal bacteremia 01/23/2013   She had two S. pneumonae bacteremia on 01/21/2013. Sensitive to Peniccilin     Past Surgical History:  Procedure Laterality Date  . AV FISTULA PLACEMENT    . AV FISTULA PLACEMENT Right 08/01/2020   Procedure: RIGHT ARM BRACHIOCEPHALIC  ARTERIOVENOUS (AV) FISTULA CREATION;  Surgeon: [name], MD;  Location: [place];  Service: Vascular;  Laterality: Right;  . BASCILIC VEIN TRANSPOSITION Left 02/05/2014   Procedure: [place];  Surgeon: [name], MD;  Location: [place];  Service: Vascular;  Laterality: Left;  .  FISTULA SUPERFICIALIZATION Left 05/30/2018   Procedure: FISTULA PLICATION BASILIC VEIN TRANSPOSITION;  Surgeon: Angelia Mould, MD;  Location: [place];  Service: Vascular;  Laterality: Left;  . FISTULA SUPERFICIALIZATION Left 97/01/6377   Procedure: PLICATION OF LEFT ARTERIOVENOUS FISTULA ULCER;  Surgeon: [name], MD;  Location: [place];  Service: Vascular;  Laterality: Left;  . INSERTION OF DIALYSIS CATHETER N/A 05/19/2020    Procedure: TUNNELED INSERTION  OF DIALYSIS CATHETER;  Surgeon: Waynetta Sandy, MD;  Location: [place];  Service: Vascular;  Laterality: N/A;  . THROMBECTOMY AND REVISION OF ARTERIOVENTOUS (AV) GORETEX  GRAFT Left 07/28/2020   Procedure: Oversewing of left arm Brachial cephalic fistula for bleeding.;  Surgeon: Angelia Mould, MD;  Location: [place];  Service: Vascular;  Laterality: Left;  Marland Kitchen VENOGRAM Right 01/31/2014   Procedure: DIALYSIS CATHETER;  Surgeon: Serafina Mitchell, MD;  Location: [facility] CATH LAB;  Service: Cardiovascular;  Laterality: Right;     Home Medications:  Prior to Admission medications   Medication Sig Start Date End Date Taking? Authorizing Provider  calcium acetate (PHOSLO) 667 MG capsule Take 1,334-2,668 mg by mouth See admin instructions. Take 4 capsule (2668 mg) by mouth three times a day with each meal and 2 capsules (1334 mg) with each snack   Yes [provider]  carvedilol (COREG) 12.5 MG tablet Take 12.5 mg by mouth 2 (two) times daily with a meal.  11/07/19  Yes [provider]  diphenhydrAMINE (BENADRYL) 25 MG tablet Take 25 mg by mouth at bedtime.    Yes [provider]  hydrOXYzine (ATARAX/VISTARIL) 10 MG tablet Take 1 tablet (10 mg total) by mouth 3 (three) times daily as needed for anxiety. Patient taking differently: Take 10 mg by mouth 3 (three) times daily as needed for itching or anxiety.  07/29/19  Yes Caccavale, Sophia, PA-C  loperamide (IMODIUM A-D) 2 MG tablet Take 4 mg by mouth daily as needed for diarrhea or loose stools.    Yes [provider]  multivitamin (RENA-VIT) TABS tablet Take 1 tablet by mouth daily.   Yes [provider]  Nutritional Supplements (NOVASOURCE RENAL) LIQD Take 237 mLs by mouth See admin instructions. Take 1 container (237 mls) by at bedtime on dialysis days (Tuesday, Thursday, Saturday)   Yes [provider]  heparin 1000 unit/mL SOLN injection 1,000 Units by Dialysis route  one time in dialysis.  05/21/20 06/05/21  [provider]  Methoxy PEG-Epoetin Beta (MIRCERA IJ) Inject 1 Dose as directed every 14 (fourteen) days. 03/16/20 03/15/21  [provider]    Inpatient Medications: Scheduled Meds: . [START ON 11/05/2020] calcitRIOL  2 mcg Oral Q T,Th,Sa-HD  . calcium acetate  2,668 mg Oral TID WC  . carvedilol  12.5 mg Oral BID WC  . Chlorhexidine Gluconate Cloth  6 each Topical Q0600  . [START ON 11/05/2020] doxercalciferol  2 mcg Intravenous Q T,Th,Sa-HD  . heparin  5,000 Units Subcutaneous Q8H  . multivitamin  1 tablet Oral QHS  . sodium chloride flush  3 mL Intravenous Q12H   Continuous Infusions:  PRN Meds: hydrOXYzine, loperamide, Muscle Rub  Allergies:    Allergies  Allergen Reactions  . Tobramycin Sulfate Swelling    Eye swelling    Social History:   Social History   Socioeconomic History  . Marital status: Single    Spouse name: Not on file  . Number of children: Not on file  . Years of education: Not on file  . Highest  education level: Not on file  Occupational History  . Not on file  Tobacco Use  . Smoking status: Current Every Day Smoker    Packs/day: 0.12    Years: 15.00    Pack years: 1.80    Types: Cigarettes  . Smokeless tobacco: Never Used  . Tobacco comment: 4 cigarettes per day  Vaping Use  . Vaping Use: Never used  Substance and Sexual Activity  . Alcohol use: Yes    Alcohol/week: 0.0 standard drinks    Comment: Special Occasional takes Vicar  . Drug use: Yes    Frequency: 5.0 times per week    Types: Marijuana    Comment: Marijuana and cocaine used last - Thanksgiving 2020  . Sexual activity: Not Currently    Birth control/protection: None  Other Topics Concern  . Not on file  Social History Narrative  . Not on file   Social Determinants of Health   Financial Resource Strain:   . Difficulty of Paying Living Expenses: Not on file  Food Insecurity:   . Worried About Charity fundraiser in  the Last Year: Not on file  . Ran Out of Food in the Last Year: Not on file  Transportation Needs:   . Lack of Transportation (Medical): Not on file  . Lack of Transportation (Non-Medical): Not on file  Physical Activity:   . Days of Exercise per Week: Not on file  . Minutes of Exercise per Session: Not on file  Stress:   . Feeling of Stress : Not on file  Social Connections:   . Frequency of Communication with Friends and Family: Not on file  . Frequency of Social Gatherings with Friends and Family: Not on file  . Attends Religious Services: Not on file  . Active Member of Clubs or Organizations: Not on file  . Attends Archivist Meetings: Not on file  . Marital Status: Not on file  Intimate Partner Violence:   . Fear of Current or Ex-Partner: Not on file  . Emotionally Abused: Not on file  . Physically Abused: Not on file  . Sexually Abused: Not on file    Family History:   Patient denies significant medical history of her parents or siblings  ROS:  Please see the history of present illness.  All other ROS reviewed and negative.     Physical Exam/Data:   Vitals:   11/04/20 0431 11/04/20 0500 11/04/20 0938 11/04/20 0940  BP: (!) 141/88  (!) 178/113 (!) 161/93  Pulse: 84  93 90  Resp: 17  18 18   Temp: (!) 97.5 F (36.4 C)  98.2 F (36.8 C) 98.2 F (36.8 C)  TempSrc: Oral     SpO2: 94%   93%  Weight:  48.9 kg    Height:        Intake/Output Summary (Last 24 hours) at 11/04/2020 1136 Last data filed at 11/04/2020 0800 Gross per 24 hour  Intake 680 ml  Output 0 ml  Net 680 ml   Last 3 Weights 11/04/2020 11/03/2020 11/03/2020  Weight (lbs) 107 lb 12.9 oz 107 lb 12.9 oz 101 lb 3.2 oz  Weight (kg) 48.9 kg 48.9 kg 45.904 kg     Body mass index is 17.4 kg/m.  General:  Well nourished, well developed, in no acute distress HEENT: normal Lymph: no adenopathy Neck: no JVD. Small circular mass right side of neck, along border of SCM. Soft Endocrine:  No  thryomegaly Vascular: No carotid bruits; FA pulses 2+  bilaterally without bruits  Cardiac:  Regular Rate/Rhythm. Holosystolic and Holodiastolic murmurs. Gallop present. Lungs:  clear to auscultation bilaterally, crackles left mid/lower lung.  Abd: soft, nontender, no hepatomegaly  Ext: no edema Musculoskeletal:  No deformities, BUE and BLE strength normal and equal Skin: warm and dry  Neuro:  CNs 2-12 intact, no focal abnormalities noted Psych:  Normal affect   EKG:  The EKG was personally reviewed and demonstrates:  Rate of 103, ectopic atrial tachycardia. Right Axis Deviation. Prolonged QTC. No ST wave changes noted.   Laboratory Data:  High Sensitivity Troponin:   Recent Labs  Lab 11/02/20 2114 11/02/20 2349  TROPONINIHS 148* 149*     Chemistry Recent Labs  Lab 11/02/20 2114 11/02/20 2114 11/02/20 2121 11/02/20 2137 11/03/20 0450  NA 136  --   --  139 132*  K 5.9*  --   --  6.0* 3.1*  CL 99  --   --  102 95*  CO2 19*  --   --   --  27  GLUCOSE 84   < > 85 89 79  BUN 75*  --   --  69* 12  CREATININE 8.68*  --   --  10.30* 2.45*  CALCIUM 9.7  --   --   --  8.8*  GFRNONAA 6*  --   --   --  26*  ANIONGAP 18*  --   --   --  10   < > = values in this interval not displayed.    Recent Labs  Lab 11/02/20 2114 11/03/20 0450  PROT 8.5* 9.8*  ALBUMIN 2.5* 2.7*  AST 38 40  ALT 27 30  ALKPHOS 164* 181*  BILITOT 0.7 0.4   Hematology Recent Labs  Lab 11/02/20 2114 11/02/20 2137 11/03/20 0450  WBC 5.0  --  4.9  RBC 4.13  --  4.15  HGB 10.7* 12.2 10.9*  HCT 36.6 36.0 35.8*  MCV 88.6  --  86.3  MCH 25.9*  --  26.3  MCHC 29.2*  --  30.4  RDW 18.2*  --  17.8*  PLT 292  --  301   BNP Recent Labs  Lab 11/02/20 2114  BNP >4,500.0*    DDimer No results for input(s): DDIMER in the last 168 hours.   Radiology/Studies:  DG Chest 2 View  Result Date: 11/03/2020 CLINICAL DATA:  End-stage renal disease on dialysis, combined systolic and diastolic heart failure,  polysubstance abuse, lupus, presents with progressive dyspnea, hypoxemia, smoker EXAM: CHEST - 2 VIEW COMPARISON:  11/02/2020 FINDINGS: LEFT jugular dual-lumen central venous catheter with tip projecting over SVC. Enlargement of cardiac silhouette with slight pulmonary vascular congestion. Scattered interstitial edema, slightly improved. Small LEFT pleural effusion again identified. No segmental consolidation or pneumothorax. Osseous structures unremarkable. IMPRESSION: Slightly improved pulmonary edema. Small LEFT pleural effusion. Electronically Signed   By: Lavonia Dana M.D.   On: 11/03/2020 15:51   DG Chest Port 1 View  Result Date: 11/02/2020 CLINICAL DATA:  Chest pain shortness of breath EXAM: PORTABLE CHEST 1 VIEW COMPARISON:  July 31, 2020 FINDINGS: The heart size and mediastinal contours are mildly enlarged. Aortic knob calcifications are seen. Diffusely increased interstitial markings are seen throughout both lungs. A small left pleural effusion is present. A left-sided central venous catheter seen with the tip at the cavoatrial junction. IMPRESSION: Mild cardiomegaly and interstitial edema. Small left pleural effusion. Electronically Signed   By: Prudencio Pair M.D.   On: 11/02/2020 21:49   ECHOCARDIOGRAM  COMPLETE  Result Date: 11/03/2020    ECHOCARDIOGRAM REPORT   Patient Name:   PARMINDER TRAPANI Shaheed Date of Exam: 11/03/2020 Medical Rec #:  [id number]      Height:       66.0 in Accession #:    [id number]     Weight:       105.5 lb Date of Birth:  1981/11/02     BSA:          1.524 m Patient Age:    64 years       BP:           163/96 mmHg Patient Gender: F              HR:           98 bpm. Exam Location:  Inpatient Procedure: 2D Echo, Cardiac Doppler and Color Doppler Indications:    Dyspnea 786.09 / R06.00  History:        Patient has prior history of Echocardiogram examinations, most                 recent 02/17/2017. Signs/Symptoms:Murmur and Dyspnea; Risk                 Factors:Current Smoker.  Pericardial effusion. Pleural effusion.  Sonographer:    Vickie Epley RDCS Referring Phys: [id number] [place]  1. Left ventricular ejection fraction, by estimation, is 30 to 35%. The left ventricle has moderately decreased function. The left ventricle demonstrates global hypokinesis. The left ventricular internal cavity size was mildly dilated. There is moderate  left ventricular hypertrophy. Left ventricular diastolic parameters are indeterminate.  2. Right ventricular systolic function is low normal. The right ventricular size is mildly enlarged. There is mildly elevated pulmonary artery systolic pressure.  3. Left atrial size was severely dilated.  4. Right atrial size was moderately dilated.  5. Severe posterior mitral anular calcfication, with a calcified linear portion protruding into the left atrium. The MR vena contracta is 0.6 cm. Primary MR etiology looks to be functional due to tethering of posterior leaflet. Moderate mitral stenosis with mean gradient of 7 mmHg. . The mitral valve is abnormal. Moderate to severe mitral valve regurgitation. Moderate mitral stenosis. Severe mitral annular calcification.  6. The aortic valve is tricuspid. There is mild calcification of the aortic valve. There is mild thickening of the aortic valve. Aortic valve regurgitation is moderate. No aortic stenosis is present. FINDINGS  Left Ventricle: Left ventricular ejection fraction, by estimation, is 30 to 35%. The left ventricle has moderately decreased function. The left ventricle demonstrates global hypokinesis. The left ventricular internal cavity size was mildly dilated. There is moderate left ventricular hypertrophy. Left ventricular diastolic parameters are indeterminate. Right Ventricle: The right ventricular size is mildly enlarged. Right vetricular wall thickness was not assessed. Right ventricular systolic function is low normal. There is mildly elevated pulmonary artery systolic pressure. The  tricuspid regurgitant velocity is 2.96 m/s, and with an assumed right atrial pressure of 3 mmHg, the estimated right ventricular systolic pressure is 28.3 mmHg. Left Atrium: Left atrial size was severely dilated. Right Atrium: Right atrial size was moderately dilated. Pericardium: There is no evidence of pericardial effusion. Mitral Valve: Severe posterior mitral anular calcfication, with a calcified linear portion protruding into the left atrium. The MR vena contracta is 0.6 cm. Primary MR etiology looks to be functional due to tethering of posterior leaflet. Moderate mitral  stenosis with mean gradient of 7 mmHg. The mitral valve is  abnormal. There is moderate thickening of the mitral valve leaflet(s). There is moderate calcification of the mitral valve leaflet(s). Severe mitral annular calcification. Moderate to severe mitral valve regurgitation. Moderate mitral valve stenosis. The mean mitral valve gradient is 7.6 mmHg with average heart rate of 99 bpm. Tricuspid Valve: The tricuspid valve is normal in structure. Tricuspid valve regurgitation is mild . No evidence of tricuspid stenosis. Aortic Valve: The aortic valve is tricuspid. There is mild calcification of the aortic valve. There is mild thickening of the aortic valve. There is mild aortic valve annular calcification. Aortic valve regurgitation is moderate. Aortic regurgitation PHT  measures 474 msec. No aortic stenosis is present. Aortic valve mean gradient measures 7.3 mmHg. Aortic valve peak gradient measures 14.0 mmHg. Aortic valve area, by VTI measures 3.26 cm. Pulmonic Valve: The pulmonic valve was not well visualized. Pulmonic valve regurgitation is trivial. No evidence of pulmonic stenosis. Aorta: The aortic root is normal in size and structure. Pulmonary Artery: 35.  IAS/Shunts: No atrial level shunt detected by color flow Doppler.  LEFT VENTRICLE PLAX 2D LVIDd:         6.10 cm LVIDs:         5.50 cm LV PW:         1.30 cm LV IVS:        1.30 cm  LVOT diam:     2.40 cm LV SV:         84 LV SV Index:   55 LVOT Area:     4.52 cm  LV Volumes (MOD) LV vol d, MOD A2C: 263.0 ml LV vol d, MOD A4C: 219.0 ml LV vol s, MOD A2C: 177.0 ml LV vol s, MOD A4C: 139.0 ml LV SV MOD A2C:     86.0 ml LV SV MOD A4C:     219.0 ml LV SV MOD BP:      90.0 ml RIGHT VENTRICLE RV S prime:     10.80 cm/s TAPSE (M-mode): 1.6 cm LEFT ATRIUM              Index        RIGHT ATRIUM           Index LA diam:        4.90 cm  3.22 cm/m   RA Area:     19.30 cm LA Vol (A2C):   149.0 ml 97.77 ml/m  RA Volume:   53.20 ml  34.91 ml/m LA Vol (A4C):   161.0 ml 105.64 ml/m LA Biplane Vol: 165.0 ml 108.27 ml/m  AORTIC VALVE AV Area (Vmax):    3.24 cm AV Area (Vmean):   3.16 cm AV Area (VTI):     3.26 cm AV Vmax:           187.03 cm/s AV Vmean:          126.357 cm/s AV VTI:            0.257 m AV Peak Grad:      14.0 mmHg AV Mean Grad:      7.3 mmHg LVOT Vmax:         134.00 cm/s LVOT Vmean:        88.200 cm/s LVOT VTI:          0.185 m LVOT/AV VTI ratio: 0.72 AI PHT:            474 msec  AORTA Ao Root diam: 3.50 cm MITRAL VALVE  TRICUSPID VALVE MV Mean grad: 7.6 mmHg       TR Peak grad:   35.0 mmHg MR Peak grad:    119.7 mmHg  TR Vmax:        296.00 cm/s MR Mean grad:    83.0 mmHg MR Vmax:         547.00 cm/s SHUNTS MR Vmean:        434.0 cm/s  Systemic VTI:  0.18 m MR PISA:         4.02 cm    Systemic Diam: 2.40 cm MR PISA Eff ROA: 29 mm MR PISA Radius:  0.80 cm Carlyle Dolly MD Electronically signed by Carlyle Dolly MD Signature Date/Time: 11/03/2020/12:04:27 PM    Final      Assessment and Plan:   1. Abnormal echocardiogram/Valvular disease, suspected secondary to ESRD - Patient with history of ESRD and non-adherence to dialysis schedule. Echo revealed severe mitral calcifications, moderate-severe regurgitation and moderate stenosis. Mild calcification/thickening of aortic valve with moderate regurgitation. Suspect history of missing dialysis sessions as well as  possibly some component of her SLE have contributed to worsening valvular disease. In comparison to prior echo from 2018, patient's valves have increased calcifications, the aortic more than the mitral. To assess how much fluid is contributing to patient's worsening cardiac function, will have TEE scheduled after dialysis session.        -TEE once euvolemic, possibly after dialysis session tomorrow or following day       -plan to make NPO once TEE scheduled       -consider consulted CT surgery after TEE results to discuss potential valve               repair. Uncertain of candidacy in context of poor adherence to dialysis sessions         2. Acute on Chronic HFrEF Exacerbation       LVEF of 30-35% with global hypokinesis. Prior echo from 2018 revealed EF of          45% with inferolateral hypokinesis, grade II diastolic dysfunction. Suspect acute        exacerbation due to missing dialysis session, but also believe progression of            valvular disease is present in context of ESRD and SLE.        -continue to manage volume status with dialysis in context of patient's ESRD             and no longer making urine       -BNP of over 4500       -daily weights/strict I/O       -Troponins trending down, suspect elevation due to volume overload state     New York Heart Association (NYHA) Functional Class NYHA Class I   For questions or updates, please contact [facility] Please consult [email or web address] for contact info under   Signed, Sanjuana Letters, MD  Internal Medicine Resident PGY-1 11/04/2020 11:36 AM

## 2020-11-04 NOTE — Progress Notes (Signed)
Dillon Bjork, RN called and made aware that the pt's HD treatment has been moved to 11/05/20.

## 2020-11-04 NOTE — Plan of Care (Signed)
  Problem: Education: Goal: Knowledge of General Education information will improve Description Including pain rating scale, medication(s)/side effects and non-pharmacologic comfort measures Outcome: Progressing   

## 2020-11-04 NOTE — Plan of Care (Signed)
  Problem: Activity: Goal: Risk for activity intolerance will decrease Outcome: Progressing   

## 2020-11-04 NOTE — Plan of Care (Signed)
  Problem: Activity: Goal: Risk for activity intolerance will decrease 11/04/2020 0846 by Mikki Santee, RN Outcome: Progressing 11/04/2020 0845 by Mikki Santee, RN Outcome: Progressing

## 2020-11-04 NOTE — Progress Notes (Signed)
[facility] Progress Note   Subjective:  Seen in room. Feels better. No SOB this am. For extra HD today.   Objective Vitals:   11/03/20 1630 11/03/20 2025 11/04/20 0431 11/04/20 0500  BP: (!) 143/75 (!) 163/91 (!) 141/88   Pulse: 94 89 84   Resp: 18 16 17    Temp: 98.7 F (37.1 C) 98.7 F (37.1 C) (!) 97.5 F (36.4 C)   TempSrc: Oral Oral Oral   SpO2: 94% 94% 94%   Weight:  48.9 kg  48.9 kg  Height:       Physical Exam General: Thin female in NAD Heart: Y0,V3 2/6 systolic M. No R/G. No JVD.  Lungs: CTAB slightly decreased in bases.  Abdomen: S, NT Active BS Extremities: No LE edema Dialysis Access: LIJ TDC Drsg intact.     Additional Objective Labs: Basic Metabolic Panel: Recent Labs  Lab 11/02/20 2114 11/02/20 2114 11/02/20 2121 11/02/20 2137 11/03/20 0450  NA 136  --   --  139 132*  K 5.9*  --   --  6.0* 3.1*  CL 99  --   --  102 95*  CO2 19*  --   --   --  27  GLUCOSE 84   < > 85 89 79  BUN 75*  --   --  69* 12  CREATININE 8.68*  --   --  10.30* 2.45*  CALCIUM 9.7  --   --   --  8.8*   < > = values in this interval not displayed.   Liver Function Tests: Recent Labs  Lab 11/02/20 2114 11/03/20 0450  AST 38 40  ALT 27 30  ALKPHOS 164* 181*  BILITOT 0.7 0.4  PROT 8.5* 9.8*  ALBUMIN 2.5* 2.7*   No results for input(s): LIPASE, AMYLASE in the last 168 hours. CBC: Recent Labs  Lab 11/02/20 2114 11/02/20 2137 11/03/20 0450  WBC 5.0  --  4.9  NEUTROABS 3.0  --   --   HGB 10.7* 12.2 10.9*  HCT 36.6 36.0 35.8*  MCV 88.6  --  86.3  PLT 292  --  301   Blood Culture    Component Value Date/Time   SDES BLOOD RIGHT ARM 07/31/2020 1244   SPECREQUEST  07/31/2020 1244    BOTTLES DRAWN AEROBIC ONLY Blood Culture results may not be optimal due to an inadequate volume of blood received in culture bottles   CULT  07/31/2020 1244    NO GROWTH 5 DAYS Performed at [facility], [place] [address], [place], [place] [address]     REPTSTATUS 08/05/2020 FINAL 07/31/2020 1244    Cardiac Enzymes: No results for input(s): CKTOTAL, CKMB, CKMBINDEX, TROPONINI in the last 168 hours. CBG: Recent Labs  Lab 11/02/20 2226 11/02/20 2311  GLUCAP 53* 64*   Iron Studies: No results for input(s): IRON, TIBC, TRANSFERRIN, FERRITIN in the last 72 hours. @lablastinr3 @ Studies/Results: DG Chest 2 View  Result Date: 11/03/2020 CLINICAL DATA:  End-stage renal disease on dialysis, combined systolic and diastolic heart failure, polysubstance abuse, lupus, presents with progressive dyspnea, hypoxemia, smoker EXAM: CHEST - 2 VIEW COMPARISON:  11/02/2020 FINDINGS: LEFT jugular dual-lumen central venous catheter with tip projecting over SVC. Enlargement of cardiac silhouette with slight pulmonary vascular congestion. Scattered interstitial edema, slightly improved. Small LEFT pleural effusion again identified. No segmental consolidation or pneumothorax. Osseous structures unremarkable. IMPRESSION: Slightly improved pulmonary edema. Small LEFT pleural effusion. Electronically Signed   By: Crist Infante.D.  On: 11/03/2020 15:51   DG Chest Port 1 View  Result Date: 11/02/2020 CLINICAL DATA:  Chest pain shortness of breath EXAM: PORTABLE CHEST 1 VIEW COMPARISON:  July 31, 2020 FINDINGS: The heart size and mediastinal contours are mildly enlarged. Aortic knob calcifications are seen. Diffusely increased interstitial markings are seen throughout both lungs. A small left pleural effusion is present. A left-sided central venous catheter seen with the tip at the cavoatrial junction. IMPRESSION: Mild cardiomegaly and interstitial edema. Small left pleural effusion. Electronically Signed   By: Prudencio Pair M.D.   On: 11/02/2020 21:49   ECHOCARDIOGRAM COMPLETE  Result Date: 11/03/2020    ECHOCARDIOGRAM REPORT   Patient Name:   [name] Date of Exam: 11/03/2020 Medical Rec #:  [id number]      Height:       66.0 in Accession #:    [id number]     Weight:        105.5 lb Date of Birth:  [birth year]     BSA:          1.524 m Patient Age:    39 years       BP:           163/96 mmHg Patient Gender: F              HR:           98 bpm. Exam Location:  Inpatient Procedure: 2D Echo, Cardiac Doppler and Color Doppler Indications:    Dyspnea 786.09 / R06.00  History:        Patient has prior history of Echocardiogram examinations, most                 recent 02/17/2017. Signs/Symptoms:Murmur and Dyspnea; Risk                 Factors:Current Smoker. Pericardial effusion. Pleural effusion.  Sonographer:    Vickie Epley RDCS Referring Phys: [id number] [place]  1. Left ventricular ejection fraction, by estimation, is 30 to 35%. The left ventricle has moderately decreased function. The left ventricle demonstrates global hypokinesis. The left ventricular internal cavity size was mildly dilated. There is moderate  left ventricular hypertrophy. Left ventricular diastolic parameters are indeterminate.  2. Right ventricular systolic function is low normal. The right ventricular size is mildly enlarged. There is mildly elevated pulmonary artery systolic pressure.  3. Left atrial size was severely dilated.  4. Right atrial size was moderately dilated.  5. Severe posterior mitral anular calcfication, with a calcified linear portion protruding into the left atrium. The MR vena contracta is 0.6 cm. Primary MR etiology looks to be functional due to tethering of posterior leaflet. Moderate mitral stenosis with mean gradient of 7 mmHg. . The mitral valve is abnormal. Moderate to severe mitral valve regurgitation. Moderate mitral stenosis. Severe mitral annular calcification.  6. The aortic valve is tricuspid. There is mild calcification of the aortic valve. There is mild thickening of the aortic valve. Aortic valve regurgitation is moderate. No aortic stenosis is present. FINDINGS  Left Ventricle: Left ventricular ejection fraction, by estimation, is 30 to 35%. The left  ventricle has moderately decreased function. The left ventricle demonstrates global hypokinesis. The left ventricular internal cavity size was mildly dilated. There is moderate left ventricular hypertrophy. Left ventricular diastolic parameters are indeterminate. Right Ventricle: The right ventricular size is mildly enlarged. Right vetricular wall thickness was not assessed. Right ventricular systolic function is low normal. There is mildly elevated pulmonary artery  systolic pressure. The tricuspid regurgitant velocity is 2.96 m/s, and with an assumed right atrial pressure of 3 mmHg, the estimated right ventricular systolic pressure is 34.1 mmHg. Left Atrium: Left atrial size was severely dilated. Right Atrium: Right atrial size was moderately dilated. Pericardium: There is no evidence of pericardial effusion. Mitral Valve: Severe posterior mitral anular calcfication, with a calcified linear portion protruding into the left atrium. The MR vena contracta is 0.6 cm. Primary MR etiology looks to be functional due to tethering of posterior leaflet. Moderate mitral  stenosis with mean gradient of 7 mmHg. The mitral valve is abnormal. There is moderate thickening of the mitral valve leaflet(s). There is moderate calcification of the mitral valve leaflet(s). Severe mitral annular calcification. Moderate to severe mitral valve regurgitation. Moderate mitral valve stenosis. The mean mitral valve gradient is 7.6 mmHg with average heart rate of 99 bpm. Tricuspid Valve: The tricuspid valve is normal in structure. Tricuspid valve regurgitation is mild . No evidence of tricuspid stenosis. Aortic Valve: The aortic valve is tricuspid. There is mild calcification of the aortic valve. There is mild thickening of the aortic valve. There is mild aortic valve annular calcification. Aortic valve regurgitation is moderate. Aortic regurgitation PHT  measures 474 msec. No aortic stenosis is present. Aortic valve mean gradient measures 7.3  mmHg. Aortic valve peak gradient measures 14.0 mmHg. Aortic valve area, by VTI measures 3.26 cm. Pulmonic Valve: The pulmonic valve was not well visualized. Pulmonic valve regurgitation is trivial. No evidence of pulmonic stenosis. Aorta: The aortic root is normal in size and structure. Pulmonary Artery: 35.  IAS/Shunts: No atrial level shunt detected by color flow Doppler.  LEFT VENTRICLE PLAX 2D LVIDd:         6.10 cm LVIDs:         5.50 cm LV PW:         1.30 cm LV IVS:        1.30 cm LVOT diam:     2.40 cm LV SV:         84 LV SV Index:   55 LVOT Area:     4.52 cm  LV Volumes (MOD) LV vol d, MOD A2C: 263.0 ml LV vol d, MOD A4C: 219.0 ml LV vol s, MOD A2C: 177.0 ml LV vol s, MOD A4C: 139.0 ml LV SV MOD A2C:     86.0 ml LV SV MOD A4C:     219.0 ml LV SV MOD BP:      90.0 ml RIGHT VENTRICLE RV S prime:     10.80 cm/s TAPSE (M-mode): 1.6 cm LEFT ATRIUM              Index        RIGHT ATRIUM           Index LA diam:        4.90 cm  3.22 cm/m   RA Area:     19.30 cm LA Vol (A2C):   149.0 ml 97.77 ml/m  RA Volume:   53.20 ml  34.91 ml/m LA Vol (A4C):   161.0 ml 105.64 ml/m LA Biplane Vol: 165.0 ml 108.27 ml/m  AORTIC VALVE AV Area (Vmax):    3.24 cm AV Area (Vmean):   3.16 cm AV Area (VTI):     3.26 cm AV Vmax:           187.03 cm/s AV Vmean:          126.357 cm/s AV VTI:  0.257 m AV Peak Grad:      14.0 mmHg AV Mean Grad:      7.3 mmHg LVOT Vmax:         134.00 cm/s LVOT Vmean:        88.200 cm/s LVOT VTI:          0.185 m LVOT/AV VTI ratio: 0.72 AI PHT:            474 msec  AORTA Ao Root diam: 3.50 cm MITRAL VALVE                 TRICUSPID VALVE MV Mean grad: 7.6 mmHg       TR Peak grad:   35.0 mmHg MR Peak grad:    119.7 mmHg  TR Vmax:        296.00 cm/s MR Mean grad:    83.0 mmHg MR Vmax:         547.00 cm/s SHUNTS MR Vmean:        434.0 cm/s  Systemic VTI:  0.18 m MR PISA:         4.02 cm    Systemic Diam: 2.40 cm MR PISA Eff ROA: 29 mm MR PISA Radius:  0.80 cm Carlyle Dolly MD  Electronically signed by Carlyle Dolly MD Signature Date/Time: 11/03/2020/12:04:27 PM    Final    Medications:  . [START ON 11/05/2020] calcitRIOL  2 mcg Oral Q T,Th,Sa-HD  . calcium acetate  2,668 mg Oral TID WC  . carvedilol  12.5 mg Oral BID WC  . Chlorhexidine Gluconate Cloth  6 each Topical Q0600  . [START ON 11/05/2020] doxercalciferol  2 mcg Intravenous Q T,Th,Sa-HD  . heparin  5,000 Units Subcutaneous Q8H  . multivitamin  1 tablet Oral QHS  . sodium chloride flush  3 mL Intravenous Q12H     OP HD: [facility]  3h 44min   400/600   45.5kg  2/2 bath  TDC/ maturing R AVF (08/01/20)  Hep 2000  - calc 2.0 mcg PO tiw  - mircera 150ug q 2 weeks, last 10/12, last Hb 9.8 10/4  - venofer 100 / wk (x 12 so far)    Assessment/ Plan: 1. SOB/ hypoxemia / acute resp failure in setting of noncompliance with HD. She has attended HD each treatment but truncated treatments. CXR 11/6 with mild interstitial edema. Net UF 3L post HD 11/6. Repeat CXR shows slight improvement. Echo 11/7 - reduced EF 30-35%. Plan for extra HD today.  2. Hyperkalemia - Resolved with HD.  3. ESRD - HD TTS via TDC. Marland Kitchen Noncompliance with HD. Comes to every treatment but signs off early. HD off schedule today. Back on schedule 11/9  4. SLE - no recent issues 5. HTN - takes coreg only 6. Anemia - last Hb 10.9.  esa in OP setting 7. MBD ckd - cont binder 8. Noncompliance with HD: This is a longstanding issue with this patient. Long discussion about need to complete treatments, watch volume.   Lynnda Child PA-C [facility] 11/04/2020,9:16 AM

## 2020-11-04 NOTE — Progress Notes (Signed)
PROGRESS NOTE    [name]  YQM:[id number] DOB: 03-09-1981 DOA: 11/02/2020 PCP: Patient, No Pcp Per    Chief Complaint  Patient presents with  . Shortness of Breath    Brief Narrative:   39 year old lady prior history of end-stage renal disease on dialysis TTS, combined systolic and diastolic heart failure, history of polysubstance abuse, history of lupus presents with progressive dyspnea.  She reports missing her dialysis the previous session.  On arrival to ED she was found to be hypoxic requiring up to 3 L of nasal cannula oxygen to keep sats greater than 90%. She underwent dialysis last night and her breathing has improved.  She is weaned off oxygen.  Patient seen and examined this morning she reports occasional chest pressure on exertion, breathing has improved but but she reports not being back to her baseline yet. An echocardiogram done earlier this morning showed left ventricular ejection fraction decreased from 45% to 30 to 35% this admission, moderately decreased function with global hypokinesis and moderate left ventricular hypertrophy.  Left ventricular diastolic parameters are indeterminate.  Her left atrial is severely dilated and right atrial was moderately dilated. We will request cardiology evaluation in the morning for optimization of medications in addition to hemodialysis for fluid management.   Assessment & Plan:   Principal Problem:   Acute respiratory failure with hypoxia (HCC) Active Problems:   Benign hypertension with ESRD (end-stage renal disease) (HCC)   Chronic combined systolic (congestive) and diastolic (congestive) heart failure (HCC)   ESRD (end stage renal disease) on dialysis (HCC)   Acute hyperkalemia   Polysubstance abuse (HCC)  Fluid overload probably secondary to missed HD/hyperkalemia She initially was hypoxic and required up to 3 L of nasal cannula oxygen.  Postdialysis she currently she is on room air with good sats.  Echocardiogram  showed decreased left ventricular ejection fraction compared to 2018 echo, with LVEF of 30 to 35% with global hypokinesis.  Volume management as per dialysis. Cardiology will be consulted for optimization of meds.    Severe to moderate MR:  Further recommendations as per cardiology    Hyperkalemia Resolved.    Occasional chest pressure on exertion: Resolved now.    Elevated troponins Probably from demand ischemia from fluid overload.   History of polysubstance abuse Patient denies using cocaine this admission.   Vaginal discharge; Will check for gonococcal/GC path in the urine.     End-stage renal disease on dialysis Counseled on being compliant with hemodialysis sessions.   Essential hypertension;  Not well controlled.  Will start her on hydralazine 25 mg every 8 hours .     DVT prophylaxis: Heparin Code Status: Full code Family Communication: None at bedside  disposition:   Status is: Observation  The patient will require care spanning > 2 midnights and should be moved to inpatient because: Hemodynamically unstable, Ongoing diagnostic testing needed not appropriate for outpatient work up, Unsafe d/c plan and Inpatient level of care appropriate due to severity of illness  Dispo: The patient is from: Home              Anticipated d/c is to: Home              Anticipated d/c date is: 2 days              Patient currently is not medically stable to d/c.       Consultants:   Nephrology  Cardiology will be consulted in the morning  Procedures: Echocardiogram Antimicrobials:  None  Subjective: No sob or chest pain this morning. wnts to know if she can go home.   Objective: Vitals:   11/04/20 0431 11/04/20 0500 11/04/20 0938 11/04/20 0940  BP: (!) 141/88  (!) 178/113 (!) 161/93  Pulse: 84  93 90  Resp: 17  18 18   Temp: (!) 97.5 F (36.4 C)  98.2 F (36.8 C) 98.2 F (36.8 C)  TempSrc: Oral     SpO2: 94%   93%  Weight:  48.9 kg    Height:         Intake/Output Summary (Last 24 hours) at 11/04/2020 1621 Last data filed at 11/04/2020 1300 Gross per 24 hour  Intake 680 ml  Output 0 ml  Net 680 ml   Filed Weights   11/03/20 1016 11/03/20 2025 11/04/20 0500  Weight: 45.9 kg 48.9 kg 48.9 kg    Examination:  General exam: Alert and comfortable not in distress Respiratory system: Air entry fair at bases, no wheezing or rhonchi patient is on room air no tachypnea Cardiovascular system: S1-S2 heard, regular rate rhythm, no pedal edema Gastrointestinal system: Min is soft nontender nondistended bowel sounds normal Central nervous system: Alert and oriented, grossly nonfocal Extremities: No pedal edema. Skin: No rashes seen Psychiatry: Mood is appropriate     Data Reviewed: I have personally reviewed following labs and imaging studies  CBC: Recent Labs  Lab 11/02/20 2114 11/02/20 2137 11/03/20 0450  WBC 5.0  --  4.9  NEUTROABS 3.0  --   --   HGB 10.7* 12.2 10.9*  HCT 36.6 36.0 35.8*  MCV 88.6  --  86.3  PLT 292  --  950    Basic Metabolic Panel: Recent Labs  Lab 11/02/20 2114 11/02/20 2121 11/02/20 2137 11/03/20 0450  NA 136  --  139 132*  K 5.9*  --  6.0* 3.1*  CL 99  --  102 95*  CO2 19*  --   --  27  GLUCOSE 84 85 89 79  BUN 75*  --  69* 12  CREATININE 8.68*  --  10.30* 2.45*  CALCIUM 9.7  --   --  8.8*    GFR: Estimated Creatinine Clearance: 24.5 mL/min (A) (by C-G formula based on SCr of 2.45 mg/dL (H)).  Liver Function Tests: Recent Labs  Lab 11/02/20 2114 11/03/20 0450  AST 38 40  ALT 27 30  ALKPHOS 164* 181*  BILITOT 0.7 0.4  PROT 8.5* 9.8*  ALBUMIN 2.5* 2.7*    CBG: Recent Labs  Lab 11/02/20 2226 11/02/20 2311  GLUCAP 53* 64*     Recent Results (from the past 240 hour(s))  Respiratory Panel by RT PCR (Flu A&B, Covid) - Nasopharyngeal Swab     Status: None   Collection Time: 11/02/20 10:58 PM   Specimen: Nasopharyngeal Swab  Result Value Ref Range Status   SARS  Coronavirus 2 by RT PCR NEGATIVE NEGATIVE Final    Comment: (NOTE) SARS-CoV-2 target nucleic acids are NOT DETECTED.  The SARS-CoV-2 RNA is generally detectable in upper respiratoy specimens during the acute phase of infection. The lowest concentration of SARS-CoV-2 viral copies this assay can detect is 131 copies/mL. A negative result does not preclude SARS-Cov-2 infection and should not be used as the sole basis for treatment or other patient management decisions. A negative result may occur with  improper specimen collection/handling, submission of specimen other than nasopharyngeal swab, presence of viral mutation(s) within the areas targeted by this assay, and inadequate number of  viral copies (<131 copies/mL). A negative result must be combined with clinical observations, patient history, and epidemiological information. The expected result is Negative.  Fact Sheet for Patients:  PinkCheek.be  Fact Sheet for Healthcare Providers:  GravelBags.it  This test is no t yet approved or cleared by the [place] FDA and  has been authorized for detection and/or diagnosis of SARS-CoV-2 by FDA under an Emergency Use Authorization (EUA). This EUA will remain  in effect (meaning this test can be used) for the duration of the COVID-19 declaration under Section 564(b)(1) of the Act, 21 U.S.C. section 360bbb-3(b)(1), unless the authorization is terminated or revoked sooner.     Influenza A by PCR NEGATIVE NEGATIVE Final   Influenza B by PCR NEGATIVE NEGATIVE Final    Comment: (NOTE) The Xpert Xpress SARS-CoV-2/FLU/RSV assay is intended as an aid in  the diagnosis of influenza from Nasopharyngeal swab specimens and  should not be used as a sole basis for treatment. Nasal washings and  aspirates are unacceptable for Xpert Xpress SARS-CoV-2/FLU/RSV  testing.  Fact Sheet for  Patients: PinkCheek.be  Fact Sheet for Healthcare Providers: GravelBags.it  This test is not yet approved or cleared by the [place] FDA and  has been authorized for detection and/or diagnosis of SARS-CoV-2 by  FDA under an Emergency Use Authorization (EUA). This EUA will remain  in effect (meaning this test can be used) for the duration of the  Covid-19 declaration under Section 564(b)(1) of the Act, 21  U.S.C. section 360bbb-3(b)(1), unless the authorization is  terminated or revoked. Performed at [facility], [place] [address] [place], [place] [address]          Radiology Studies: DG Chest 2 View  Result Date: 11/03/2020 CLINICAL DATA:  End-stage renal disease on dialysis, combined systolic and diastolic heart failure, polysubstance abuse, lupus, presents with progressive dyspnea, hypoxemia, smoker EXAM: CHEST - 2 VIEW COMPARISON:  11/02/2020 FINDINGS: LEFT jugular dual-lumen central venous catheter with tip projecting over SVC. Enlargement of cardiac silhouette with slight pulmonary vascular congestion. Scattered interstitial edema, slightly improved. Small LEFT pleural effusion again identified. No segmental consolidation or pneumothorax. Osseous structures unremarkable. IMPRESSION: Slightly improved pulmonary edema. Small LEFT pleural effusion. Electronically Signed   By: Lavonia Dana M.D.   On: 11/03/2020 15:51   DG Chest Port 1 View  Result Date: 11/02/2020 CLINICAL DATA:  Chest pain shortness of breath EXAM: PORTABLE CHEST 1 VIEW COMPARISON:  July 31, 2020 FINDINGS: The heart size and mediastinal contours are mildly enlarged. Aortic knob calcifications are seen. Diffusely increased interstitial markings are seen throughout both lungs. A small left pleural effusion is present. A left-sided central venous catheter seen with the tip at the cavoatrial junction. IMPRESSION: Mild cardiomegaly and interstitial edema.  Small left pleural effusion. Electronically Signed   By: Prudencio Pair M.D.   On: 11/02/2020 21:49   ECHOCARDIOGRAM COMPLETE  Result Date: 11/03/2020    ECHOCARDIOGRAM REPORT   Patient Name:   [name] Date of Exam: 11/03/2020 Medical Rec #:  [id number]      Height:       66.0 in Accession #:    [id number]     Weight:       105.5 lb Date of Birth:  24-Feb-1981     BSA:          1.524 m Patient Age:    38 years       BP:  163/96 mmHg Patient Gender: F              HR:           98 bpm. Exam Location:  Inpatient Procedure: 2D Echo, Cardiac Doppler and Color Doppler Indications:    Dyspnea 786.09 / R06.00  History:        Patient has prior history of Echocardiogram examinations, most                 recent 02/17/2017. Signs/Symptoms:Murmur and Dyspnea; Risk                 Factors:Current Smoker. Pericardial effusion. Pleural effusion.  Sonographer:    Vickie Epley RDCS Referring Phys: [id number] [place]  1. Left ventricular ejection fraction, by estimation, is 30 to 35%. The left ventricle has moderately decreased function. The left ventricle demonstrates global hypokinesis. The left ventricular internal cavity size was mildly dilated. There is moderate  left ventricular hypertrophy. Left ventricular diastolic parameters are indeterminate.  2. Right ventricular systolic function is low normal. The right ventricular size is mildly enlarged. There is mildly elevated pulmonary artery systolic pressure.  3. Left atrial size was severely dilated.  4. Right atrial size was moderately dilated.  5. Severe posterior mitral anular calcfication, with a calcified linear portion protruding into the left atrium. The MR vena contracta is 0.6 cm. Primary MR etiology looks to be functional due to tethering of posterior leaflet. Moderate mitral stenosis with mean gradient of 7 mmHg. . The mitral valve is abnormal. Moderate to severe mitral valve regurgitation. Moderate mitral stenosis. Severe mitral  annular calcification.  6. The aortic valve is tricuspid. There is mild calcification of the aortic valve. There is mild thickening of the aortic valve. Aortic valve regurgitation is moderate. No aortic stenosis is present. FINDINGS  Left Ventricle: Left ventricular ejection fraction, by estimation, is 30 to 35%. The left ventricle has moderately decreased function. The left ventricle demonstrates global hypokinesis. The left ventricular internal cavity size was mildly dilated. There is moderate left ventricular hypertrophy. Left ventricular diastolic parameters are indeterminate. Right Ventricle: The right ventricular size is mildly enlarged. Right vetricular wall thickness was not assessed. Right ventricular systolic function is low normal. There is mildly elevated pulmonary artery systolic pressure. The tricuspid regurgitant velocity is 2.96 m/s, and with an assumed right atrial pressure of 3 mmHg, the estimated right ventricular systolic pressure is 50.2 mmHg. Left Atrium: Left atrial size was severely dilated. Right Atrium: Right atrial size was moderately dilated. Pericardium: There is no evidence of pericardial effusion. Mitral Valve: Severe posterior mitral anular calcfication, with a calcified linear portion protruding into the left atrium. The MR vena contracta is 0.6 cm. Primary MR etiology looks to be functional due to tethering of posterior leaflet. Moderate mitral  stenosis with mean gradient of 7 mmHg. The mitral valve is abnormal. There is moderate thickening of the mitral valve leaflet(s). There is moderate calcification of the mitral valve leaflet(s). Severe mitral annular calcification. Moderate to severe mitral valve regurgitation. Moderate mitral valve stenosis. The mean mitral valve gradient is 7.6 mmHg with average heart rate of 99 bpm. Tricuspid Valve: The tricuspid valve is normal in structure. Tricuspid valve regurgitation is mild . No evidence of tricuspid stenosis. Aortic Valve: The  aortic valve is tricuspid. There is mild calcification of the aortic valve. There is mild thickening of the aortic valve. There is mild aortic valve annular calcification. Aortic valve regurgitation is moderate. Aortic regurgitation PHT  measures 474 msec. No aortic stenosis is present. Aortic valve mean gradient measures 7.3 mmHg. Aortic valve peak gradient measures 14.0 mmHg. Aortic valve area, by VTI measures 3.26 cm. Pulmonic Valve: The pulmonic valve was not well visualized. Pulmonic valve regurgitation is trivial. No evidence of pulmonic stenosis. Aorta: The aortic root is normal in size and structure. Pulmonary Artery: 35.  IAS/Shunts: No atrial level shunt detected by color flow Doppler.  LEFT VENTRICLE PLAX 2D LVIDd:         6.10 cm LVIDs:         5.50 cm LV PW:         1.30 cm LV IVS:        1.30 cm LVOT diam:     2.40 cm LV SV:         84 LV SV Index:   55 LVOT Area:     4.52 cm  LV Volumes (MOD) LV vol d, MOD A2C: 263.0 ml LV vol d, MOD A4C: 219.0 ml LV vol s, MOD A2C: 177.0 ml LV vol s, MOD A4C: 139.0 ml LV SV MOD A2C:     86.0 ml LV SV MOD A4C:     219.0 ml LV SV MOD BP:      90.0 ml RIGHT VENTRICLE RV S prime:     10.80 cm/s TAPSE (M-mode): 1.6 cm LEFT ATRIUM              Index        RIGHT ATRIUM           Index LA diam:        4.90 cm  3.22 cm/m   RA Area:     19.30 cm LA Vol (A2C):   149.0 ml 97.77 ml/m  RA Volume:   53.20 ml  34.91 ml/m LA Vol (A4C):   161.0 ml 105.64 ml/m LA Biplane Vol: 165.0 ml 108.27 ml/m  AORTIC VALVE AV Area (Vmax):    3.24 cm AV Area (Vmean):   3.16 cm AV Area (VTI):     3.26 cm AV Vmax:           187.03 cm/s AV Vmean:          126.357 cm/s AV VTI:            0.257 m AV Peak Grad:      14.0 mmHg AV Mean Grad:      7.3 mmHg LVOT Vmax:         134.00 cm/s LVOT Vmean:        88.200 cm/s LVOT VTI:          0.185 m LVOT/AV VTI ratio: 0.72 AI PHT:            474 msec  AORTA Ao Root diam: 3.50 cm MITRAL VALVE                 TRICUSPID VALVE MV Mean grad: 7.6 mmHg        TR Peak grad:   35.0 mmHg MR Peak grad:    119.7 mmHg  TR Vmax:        296.00 cm/s MR Mean grad:    83.0 mmHg MR Vmax:         547.00 cm/s SHUNTS MR Vmean:        434.0 cm/s  Systemic VTI:  0.18 m MR PISA:         4.02 cm    Systemic Diam: 2.40 cm MR PISA Eff ROA:  29 mm MR PISA Radius:  0.80 cm Carlyle Dolly MD Electronically signed by Carlyle Dolly MD Signature Date/Time: 11/03/2020/12:04:27 PM    Final         Scheduled Meds: . Derrill Memo ON 11/05/2020] calcitRIOL  2 mcg Oral Q T,Th,Sa-HD  . calcium acetate  2,668 mg Oral TID WC  . carvedilol  12.5 mg Oral BID WC  . Chlorhexidine Gluconate Cloth  6 each Topical Q0600  . [START ON 11/05/2020] doxercalciferol  2 mcg Intravenous Q T,Th,Sa-HD  . heparin  5,000 Units Subcutaneous Q8H  . multivitamin  1 tablet Oral QHS  . sodium chloride flush  3 mL Intravenous Q12H   Continuous Infusions:   LOS: 1 day        Hosie Poisson, MD [facility]   To contact the attending provider between 7A-7P or the covering provider during after hours 7P-7A, please log into the web site [email or web address] and access using universal [place] password for that web site. If you do not have the password, please call the hospital operator.  11/04/2020, 4:21 PM

## 2020-11-05 DIAGNOSIS — I429 Cardiomyopathy, unspecified: Secondary | ICD-10-CM

## 2020-11-05 DIAGNOSIS — J9601 Acute respiratory failure with hypoxia: Secondary | ICD-10-CM | POA: Diagnosis not present

## 2020-11-05 LAB — CBC
HCT: 32.3 % — ABNORMAL LOW (ref 36.0–46.0)
Hemoglobin: 10 g/dL — ABNORMAL LOW (ref 12.0–15.0)
MCH: 26.3 pg (ref 26.0–34.0)
MCHC: 31 g/dL (ref 30.0–36.0)
MCV: 85 fL (ref 80.0–100.0)
Platelets: 279 10*3/uL (ref 150–400)
RBC: 3.8 MIL/uL — ABNORMAL LOW (ref 3.87–5.11)
RDW: 17.5 % — ABNORMAL HIGH (ref 11.5–15.5)
WBC: 4.6 10*3/uL (ref 4.0–10.5)
nRBC: 0 % (ref 0.0–0.2)

## 2020-11-05 LAB — HIV ANTIBODY (ROUTINE TESTING W REFLEX): HIV Screen 4th Generation wRfx: NONREACTIVE

## 2020-11-05 MED ORDER — HEPARIN SODIUM (PORCINE) 1000 UNIT/ML IJ SOLN
INTRAMUSCULAR | Status: AC
  Filled 2020-11-05: qty 2

## 2020-11-05 MED ORDER — HEPARIN SODIUM (PORCINE) 1000 UNIT/ML DIALYSIS
2000.0000 [IU] | Freq: Once | INTRAMUSCULAR | Status: AC
  Administered 2020-11-05: 2000 [IU] via INTRAVENOUS_CENTRAL

## 2020-11-05 MED ORDER — SODIUM ZIRCONIUM CYCLOSILICATE 10 G PO PACK: 10.0000 g | PACK | Freq: Once | ORAL | Status: DC

## 2020-11-05 MED ORDER — SODIUM CHLORIDE 0.9 % IV SOLN: 100.0000 mL | INTRAVENOUS | Status: DC | PRN

## 2020-11-05 MED ORDER — CALCITRIOL 0.5 MCG PO CAPS
ORAL_CAPSULE | ORAL | Status: AC
  Filled 2020-11-05: qty 4

## 2020-11-05 MED ORDER — PENTAFLUOROPROP-TETRAFLUOROETH EX AERO: 1.0000 "application " | INHALATION_SPRAY | CUTANEOUS | Status: DC | PRN

## 2020-11-05 MED ORDER — LIDOCAINE-PRILOCAINE 2.5-2.5 % EX CREA: 1.0000 "application " | TOPICAL_CREAM | CUTANEOUS | Status: DC | PRN

## 2020-11-05 MED ORDER — HEPARIN SODIUM (PORCINE) 1000 UNIT/ML DIALYSIS
1000.0000 [IU] | INTRAMUSCULAR | Status: DC | PRN
  Administered 2020-11-05: 1000 [IU] via INTRAVENOUS_CENTRAL

## 2020-11-05 MED ORDER — CALCITRIOL 0.5 MCG PO CAPS: 2.0000 ug | ORAL_CAPSULE | ORAL | Status: DC

## 2020-11-05 MED ORDER — DOXERCALCIFEROL 4 MCG/2ML IV SOLN
INTRAVENOUS | Status: DC
Start: 2020-11-05 — End: 2024-04-17

## 2020-11-05 MED ORDER — LIDOCAINE HCL (PF) 1 % IJ SOLN: 5.0000 mL | INTRAMUSCULAR | Status: DC | PRN

## 2020-11-05 MED ORDER — DOXERCALCIFEROL 4 MCG/2ML IV SOLN
INTRAVENOUS | Status: AC
  Filled 2020-11-05: qty 2

## 2020-11-05 MED ORDER — PROCHLORPERAZINE EDISYLATE 10 MG/2ML IJ SOLN
10.0000 mg | Freq: Four times a day (QID) | INTRAMUSCULAR | Status: DC | PRN
  Administered 2020-11-05: 10 mg via INTRAVENOUS
  Filled 2020-11-05: qty 2

## 2020-11-05 MED ORDER — ALTEPLASE 2 MG IJ SOLR: 2.0000 mg | Freq: Once | INTRAMUSCULAR | Status: DC | PRN

## 2020-11-05 NOTE — Progress Notes (Addendum)
[facility] Progress Note   Subjective:  Seen on HD - feeling fine, no new issues.  Objective Vitals:   11/05/20 0726 11/05/20 0730 11/05/20 0800 11/05/20 0830  BP: 101/76 133/88 (!) 145/80 (!) 142/88  Pulse: 71 79 77 82  Resp: (!) 23 (!) 22 16 13   Temp:      TempSrc:      SpO2:      Weight:      Height:       Physical Exam General: Thin female in NAD Heart: N0,N3 2/6 systolic M. No R/G. No JVD.  Lungs: CTAB slightly decreased in bases.  Abdomen: S, NT Active BS Extremities: No LE edema Dialysis Access: LIJ TDC Drsg intact.  RUE AVF +t/b but lots of collaterals    Additional Objective Labs: Basic Metabolic Panel: Recent Labs  Lab 11/02/20 2114 11/02/20 2121 11/02/20 2137 11/03/20 0450 11/05/20 0241  NA 136   < > 139 132* 133*  K 5.9*   < > 6.0* 3.1* 5.4*  CL 99   < > 102 95* 95*  CO2 19*  --   --  27 24  GLUCOSE 84   < > 89 79 95  BUN 75*   < > 69* 12 96*  CREATININE 8.68*   < > 10.30* 2.45* 8.27*  CALCIUM 9.7  --   --  8.8* 10.4*   < > = values in this interval not displayed.   Liver Function Tests: Recent Labs  Lab 11/02/20 2114 11/03/20 0450  AST 38 40  ALT 27 30  ALKPHOS 164* 181*  BILITOT 0.7 0.4  PROT 8.5* 9.8*  ALBUMIN 2.5* 2.7*   No results for input(s): LIPASE, AMYLASE in the last 168 hours. CBC: Recent Labs  Lab 11/02/20 2114 11/02/20 2114 11/02/20 2137 11/03/20 0450 11/05/20 0241  WBC 5.0  --   --  4.9 4.6  NEUTROABS 3.0  --   --   --   --   HGB 10.7*   < > 12.2 10.9* 10.0*  HCT 36.6   < > 36.0 35.8* 32.3*  MCV 88.6  --   --  86.3 85.0  PLT 292  --   --  301 279   < > = values in this interval not displayed.   Blood Culture    Component Value Date/Time   SDES BLOOD RIGHT ARM 07/31/2020 1244   SPECREQUEST  07/31/2020 1244    BOTTLES DRAWN AEROBIC ONLY Blood Culture results may not be optimal due to an inadequate volume of blood received in culture bottles   CULT  07/31/2020 1244    NO GROWTH 5  DAYS Performed at [facility], [place] [address], [place], [place] [address]    REPTSTATUS 08/05/2020 FINAL 07/31/2020 1244    Cardiac Enzymes: No results for input(s): CKTOTAL, CKMB, CKMBINDEX, TROPONINI in the last 168 hours. CBG: Recent Labs  Lab 11/02/20 2226 11/02/20 2311  GLUCAP 53* 64*   Iron Studies: No results for input(s): IRON, TIBC, TRANSFERRIN, FERRITIN in the last 72 hours. @lablastinr3 @ Studies/Results: DG Chest 2 View  Result Date: 11/03/2020 CLINICAL DATA:  End-stage renal disease on dialysis, combined systolic and diastolic heart failure, polysubstance abuse, lupus, presents with progressive dyspnea, hypoxemia, smoker EXAM: CHEST - 2 VIEW COMPARISON:  11/02/2020 FINDINGS: LEFT jugular dual-lumen central venous catheter with tip projecting over SVC. Enlargement of cardiac silhouette with slight pulmonary vascular congestion. Scattered interstitial edema, slightly improved. Small LEFT pleural effusion again identified. No segmental consolidation  or pneumothorax. Osseous structures unremarkable. IMPRESSION: Slightly improved pulmonary edema. Small LEFT pleural effusion. Electronically Signed   By: Lavonia Dana M.D.   On: 11/03/2020 15:51   ECHOCARDIOGRAM COMPLETE  Result Date: 11/03/2020    ECHOCARDIOGRAM REPORT   Patient Name:   [name] Date of Exam: 11/03/2020 Medical Rec #:  [id number]      Height:       66.0 in Accession #:    [id number]     Weight:       105.5 lb Date of Birth:  1981-02-22     BSA:          1.524 m Patient Age:    39 years       BP:           163/96 mmHg Patient Gender: F              HR:           98 bpm. Exam Location:  Inpatient Procedure: 2D Echo, Cardiac Doppler and Color Doppler Indications:    Dyspnea 786.09 / R06.00  History:        Patient has prior history of Echocardiogram examinations, most                 recent 02/17/2017. Signs/Symptoms:Murmur and Dyspnea; Risk                 Factors:Current Smoker. Pericardial effusion. Pleural  effusion.  Sonographer:    Vickie Epley RDCS Referring Phys: [id number] [place]  1. Left ventricular ejection fraction, by estimation, is 30 to 35%. The left ventricle has moderately decreased function. The left ventricle demonstrates global hypokinesis. The left ventricular internal cavity size was mildly dilated. There is moderate  left ventricular hypertrophy. Left ventricular diastolic parameters are indeterminate.  2. Right ventricular systolic function is low normal. The right ventricular size is mildly enlarged. There is mildly elevated pulmonary artery systolic pressure.  3. Left atrial size was severely dilated.  4. Right atrial size was moderately dilated.  5. Severe posterior mitral anular calcfication, with a calcified linear portion protruding into the left atrium. The MR vena contracta is 0.6 cm. Primary MR etiology looks to be functional due to tethering of posterior leaflet. Moderate mitral stenosis with mean gradient of 7 mmHg. . The mitral valve is abnormal. Moderate to severe mitral valve regurgitation. Moderate mitral stenosis. Severe mitral annular calcification.  6. The aortic valve is tricuspid. There is mild calcification of the aortic valve. There is mild thickening of the aortic valve. Aortic valve regurgitation is moderate. No aortic stenosis is present. FINDINGS  Left Ventricle: Left ventricular ejection fraction, by estimation, is 30 to 35%. The left ventricle has moderately decreased function. The left ventricle demonstrates global hypokinesis. The left ventricular internal cavity size was mildly dilated. There is moderate left ventricular hypertrophy. Left ventricular diastolic parameters are indeterminate. Right Ventricle: The right ventricular size is mildly enlarged. Right vetricular wall thickness was not assessed. Right ventricular systolic function is low normal. There is mildly elevated pulmonary artery systolic pressure. The tricuspid regurgitant velocity is  2.96 m/s, and with an assumed right atrial pressure of 3 mmHg, the estimated right ventricular systolic pressure is 95.6 mmHg. Left Atrium: Left atrial size was severely dilated. Right Atrium: Right atrial size was moderately dilated. Pericardium: There is no evidence of pericardial effusion. Mitral Valve: Severe posterior mitral anular calcfication, with a calcified linear portion protruding into the left atrium. The MR vena  contracta is 0.6 cm. Primary MR etiology looks to be functional due to tethering of posterior leaflet. Moderate mitral  stenosis with mean gradient of 7 mmHg. The mitral valve is abnormal. There is moderate thickening of the mitral valve leaflet(s). There is moderate calcification of the mitral valve leaflet(s). Severe mitral annular calcification. Moderate to severe mitral valve regurgitation. Moderate mitral valve stenosis. The mean mitral valve gradient is 7.6 mmHg with average heart rate of 99 bpm. Tricuspid Valve: The tricuspid valve is normal in structure. Tricuspid valve regurgitation is mild . No evidence of tricuspid stenosis. Aortic Valve: The aortic valve is tricuspid. There is mild calcification of the aortic valve. There is mild thickening of the aortic valve. There is mild aortic valve annular calcification. Aortic valve regurgitation is moderate. Aortic regurgitation PHT  measures 474 msec. No aortic stenosis is present. Aortic valve mean gradient measures 7.3 mmHg. Aortic valve peak gradient measures 14.0 mmHg. Aortic valve area, by VTI measures 3.26 cm. Pulmonic Valve: The pulmonic valve was not well visualized. Pulmonic valve regurgitation is trivial. No evidence of pulmonic stenosis. Aorta: The aortic root is normal in size and structure. Pulmonary Artery: 35.  IAS/Shunts: No atrial level shunt detected by color flow Doppler.  LEFT VENTRICLE PLAX 2D LVIDd:         6.10 cm LVIDs:         5.50 cm LV PW:         1.30 cm LV IVS:        1.30 cm LVOT diam:     2.40 cm LV SV:          84 LV SV Index:   55 LVOT Area:     4.52 cm  LV Volumes (MOD) LV vol d, MOD A2C: 263.0 ml LV vol d, MOD A4C: 219.0 ml LV vol s, MOD A2C: 177.0 ml LV vol s, MOD A4C: 139.0 ml LV SV MOD A2C:     86.0 ml LV SV MOD A4C:     219.0 ml LV SV MOD BP:      90.0 ml RIGHT VENTRICLE RV S prime:     10.80 cm/s TAPSE (M-mode): 1.6 cm LEFT ATRIUM              Index        RIGHT ATRIUM           Index LA diam:        4.90 cm  3.22 cm/m   RA Area:     19.30 cm LA Vol (A2C):   149.0 ml 97.77 ml/m  RA Volume:   53.20 ml  34.91 ml/m LA Vol (A4C):   161.0 ml 105.64 ml/m LA Biplane Vol: 165.0 ml 108.27 ml/m  AORTIC VALVE AV Area (Vmax):    3.24 cm AV Area (Vmean):   3.16 cm AV Area (VTI):     3.26 cm AV Vmax:           187.03 cm/s AV Vmean:          126.357 cm/s AV VTI:            0.257 m AV Peak Grad:      14.0 mmHg AV Mean Grad:      7.3 mmHg LVOT Vmax:         134.00 cm/s LVOT Vmean:        88.200 cm/s LVOT VTI:          0.185 m LVOT/AV VTI ratio: 0.72 AI PHT:  474 msec  AORTA Ao Root diam: 3.50 cm MITRAL VALVE                 TRICUSPID VALVE MV Mean grad: 7.6 mmHg       TR Peak grad:   35.0 mmHg MR Peak grad:    119.7 mmHg  TR Vmax:        296.00 cm/s MR Mean grad:    83.0 mmHg MR Vmax:         547.00 cm/s SHUNTS MR Vmean:        434.0 cm/s  Systemic VTI:  0.18 m MR PISA:         4.02 cm    Systemic Diam: 2.40 cm MR PISA Eff ROA: 29 mm MR PISA Radius:  0.80 cm [name] Dolly MD Electronically signed by [name] Dolly MD Signature Date/Time: 11/03/2020/12:04:27 PM    Final    Medications: . sodium chloride    . sodium chloride     . calcitRIOL  2 mcg Oral Q T,Th,Sa-HD  . calcium acetate  2,668 mg Oral TID WC  . carvedilol  12.5 mg Oral BID WC  . Chlorhexidine Gluconate Cloth  6 each Topical Q0600  . doxercalciferol  2 mcg Intravenous Q T,Th,Sa-HD  . heparin  2,000 Units Dialysis Once in dialysis  . heparin  5,000 Units Subcutaneous Q8H  . hydrALAZINE  25 mg Oral Q8H  . multivitamin  1 tablet Oral  QHS  . sodium chloride flush  3 mL Intravenous Q12H  . sodium zirconium cyclosilicate  10 g Oral Once     [facility] HD: [facility]  3h 64min   400/600   45.5kg  2/2 bath  TDC/ maturing R AVF (08/01/20)  Hep 2000  - calc 2.0 mcg PO tiw  - mircera 150ug q 2 weeks, last 10/12, last Hb 9.8 10/4  - venofer 100 / wk (x 12 so far)    Assessment/ Plan:   1. SOB/ hypoxemia / acute resp failure in setting of noncompliance with HD. She has attended HD each treatment but truncated treatments. CXR 11/6 with mild interstitial edema. Net UF 3L post HD 11/6. Repeat CXR shows slight improvement. Echo 11/7 - reduced EF 30-35%. wasn't able to have extra treatment to make up for her self shortened treatments outpt yesterday due to inpatient census constraints.  2. ESRD - HD TTS via TDC. Comes to every treatment but signs off early. HD on schedule today. UF goal is 3.5L total (inc flush).  3. SLE - no recent issues 4. HTN - takes coreg only 5. Anemia - last Hb 10.9.  esa in OP setting 6. MBD ckd - cont binder 7. Valvular heart disease - TTE this admission with severe AR, mod/severe MR; discussed with cardiology - plan is for TEE outpt to further eval and discuss options.  8. Noncompliance with HD: This is a longstanding issue with this patient. Long discussion about need to complete treatments, watch volume.   Dispo - I think she could be discharged after HD today as she is clinically improved and cardiology planning further w/u outpt.  D/w cardiology who is in agreement.  Her adherence to medical therapies will be paramount for her and this has been discussed.    Jannifer Hick MD [facility] Pager [phone]

## 2020-11-05 NOTE — Progress Notes (Signed)
Progress Note  Patient Name: [name] Date of Encounter: 11/05/2020  [facility] Cardiologist: Buford Dresser, MD   Subjective   Seen in dialysis today. Feeling better. I discussed that we could do TEE today at 2 PM if she is willing to remain NPO. She states that she is hungry, would rather come back as an outpatient. See plan below.  Inpatient Medications    Scheduled Meds: . calcitRIOL  2 mcg Oral Q T,Th,Sa-HD  . calcium acetate  2,668 mg Oral TID WC  . carvedilol  12.5 mg Oral BID WC  . Chlorhexidine Gluconate Cloth  6 each Topical Q0600  . doxercalciferol  2 mcg Intravenous Q T,Th,Sa-HD  . heparin  5,000 Units Subcutaneous Q8H  . hydrALAZINE  25 mg Oral Q8H  . multivitamin  1 tablet Oral QHS  . sodium chloride flush  3 mL Intravenous Q12H  . sodium zirconium cyclosilicate  10 g Oral Once   Continuous Infusions: . sodium chloride    . sodium chloride     PRN Meds: sodium chloride, sodium chloride, alteplase, heparin, hydrOXYzine, lidocaine (PF), lidocaine-prilocaine, loperamide, Muscle Rub, pentafluoroprop-tetrafluoroeth, prochlorperazine   Vital Signs    Vitals:   11/05/20 0830 11/05/20 0900 11/05/20 0930 11/05/20 0959  BP: (!) 142/88 104/76 101/78   Pulse: 82 81 84 77  Resp: 13 16 18    Temp:    98 F (36.7 C)  TempSrc:    Oral  SpO2:    97%  Weight:      Height:        Intake/Output Summary (Last 24 hours) at 11/05/2020 1038 Last data filed at 11/05/2020 0437 Gross per 24 hour  Intake 360 ml  Output --  Net 360 ml   Last 3 Weights 11/05/2020 11/04/2020 11/04/2020  Weight (lbs) (No Data) 115 lb 1.3 oz 107 lb 12.9 oz  Weight (kg) (No Data) 52.2 kg 48.9 kg      Telemetry    Not on telemetry during HD - Personally Reviewed  ECG    No new - Personally Reviewed  Physical Exam   GEN: No acute distress.   Neck: No JVD Cardiac: RRR, +S3. Has RUSB 1/6 systolic ejection murmur and 2/4 holodiastolic murmur. Has also 2/6 holosystolic  murmur at apex as well. Respiratory: Clear to auscultation anteriorly, diminished slightly at bilateral bases GI: Soft, nontender, non-distended  MS: No edema; No deformity. Neuro:  Nonfocal  Psych: Normal affect   Labs    High Sensitivity Troponin:   Recent Labs  Lab 11/02/20 2114 11/02/20 2349  TROPONINIHS 148* 149*      Chemistry Recent Labs  Lab 11/02/20 2114 11/02/20 2121 11/02/20 2137 11/03/20 0450 11/05/20 0241  NA 136   < > 139 132* 133*  K 5.9*   < > 6.0* 3.1* 5.4*  CL 99   < > 102 95* 95*  CO2 19*  --   --  27 24  GLUCOSE 84   < > 89 79 95  BUN 75*   < > 69* 12 96*  CREATININE 8.68*   < > 10.30* 2.45* 8.27*  CALCIUM 9.7  --   --  8.8* 10.4*  PROT 8.5*  --   --  9.8*  --   ALBUMIN 2.5*  --   --  2.7*  --   AST 38  --   --  40  --   ALT 27  --   --  30  --   ALKPHOS 164*  --   --  181*  --   BILITOT 0.7  --   --  0.4  --   GFRNONAA 6*  --   --  26* 6*  ANIONGAP 18*  --   --  10 14   < > = values in this interval not displayed.     Hematology Recent Labs  Lab 11/02/20 2114 11/02/20 2114 11/02/20 2137 11/03/20 0450 11/05/20 0241  WBC 5.0  --   --  4.9 4.6  RBC 4.13  --   --  4.15 3.80*  HGB 10.7*   < > 12.2 10.9* 10.0*  HCT 36.6   < > 36.0 35.8* 32.3*  MCV 88.6  --   --  86.3 85.0  MCH 25.9*  --   --  26.3 26.3  MCHC 29.2*  --   --  30.4 31.0  RDW 18.2*  --   --  17.8* 17.5*  PLT 292  --   --  301 279   < > = values in this interval not displayed.    BNP Recent Labs  Lab 11/02/20 2114  BNP >4,500.0*     DDimer No results for input(s): DDIMER in the last 168 hours.   Radiology    DG Chest 2 View  Result Date: 11/03/2020 CLINICAL DATA:  End-stage renal disease on dialysis, combined systolic and diastolic heart failure, polysubstance abuse, lupus, presents with progressive dyspnea, hypoxemia, smoker EXAM: CHEST - 2 VIEW COMPARISON:  11/02/2020 FINDINGS: LEFT jugular dual-lumen central venous catheter with tip projecting over SVC.  Enlargement of cardiac silhouette with slight pulmonary vascular congestion. Scattered interstitial edema, slightly improved. Small LEFT pleural effusion again identified. No segmental consolidation or pneumothorax. Osseous structures unremarkable. IMPRESSION: Slightly improved pulmonary edema. Small LEFT pleural effusion. Electronically Signed   By: Lavonia Dana M.D.   On: 11/03/2020 15:51    Cardiac Studies   Echo 11/03/20 1. Left ventricular ejection fraction, by estimation, is 30 to 35%. The  left ventricle has moderately decreased function. The left ventricle  demonstrates global hypokinesis. The left ventricular internal cavity size  was mildly dilated. There is moderate  left ventricular hypertrophy. Left ventricular diastolic parameters are  indeterminate.  2. Right ventricular systolic function is low normal. The right  ventricular size is mildly enlarged. There is mildly elevated pulmonary  artery systolic pressure.  3. Left atrial size was severely dilated.  4. Right atrial size was moderately dilated.  5. Severe posterior mitral anular calcfication, with a calcified linear  portion protruding into the left atrium. The MR vena contracta is 0.6 cm.  Primary MR etiology looks to be functional due to tethering of posterior  leaflet. Moderate mitral stenosis  with mean gradient of 7 mmHg. . The mitral valve is abnormal. Moderate to  severe mitral valve regurgitation. Moderate mitral stenosis. Severe mitral  annular calcification.  6. The aortic valve is tricuspid. There is mild calcification of the  aortic valve. There is mild thickening of the aortic valve. Aortic valve  regurgitation is moderate. No aortic stenosis is present.   Patient Profile     39 y.o. female with complex PMH, including ESRD on hemodialysis secondary to lupus, chronic systolic and diastolic heart failure, paroxysmal atrial fibrillation, history of pericarditis/effusion s/p pericardiocentesis who is seen  for shortness of breath and abnormal echocardiogram  Assessment & Plan    Valve disease: on exam and echo, suggests severe aortic regurgitation, moderate to severe MR -echo was done prior to dialysis. Would be helpful to see if  MR especially has changed with improved volume status -discussed TEE, she is amenable. However, she would prefer to do this as an outpatient. I discussed scheduling as an outpatient today (Wednesdays work best for her), but she would prefer to come back into clinic and then schedule at that time. -overall, I am concerned about the findings. With cardiomyopathy, aortic valve disease, mitral valve disease, I suspect that surgery (vs noninvasive option) would be only real choice. Given her young age and ESRD, tissue valve lifespan not ideal. However, given that mechanical valves would necessitate coumadin and close followup, this would not be ideal either.  Acute on chronic systolic and diastolic heart failure -volume management with dialysis -restarted carvedilol, caution given cocaine use -blood pressure post dialysis 101/78, no room for isordil-hydralazine  Paroxysmal SVT/atrial fibrillation -has been in sinus rhythm this admission  [facility] will sign off.   Medication Recommendations: continue carvedilol 12.5 mg BID.  Other recommendations (labs, testing, etc):  none Follow up as an outpatient:  We will arrange for her to see me or a member of my team in several weeks to monitor symptoms and plan for TEE, per patient request.  For questions or updates, please contact [facility] Please consult [email or web address] for contact info under     Signed, Buford Dresser, MD  11/05/2020, 10:38 AM

## 2020-11-05 NOTE — Progress Notes (Signed)
DISCHARGE NOTE HOME [name] to be discharged Home per MD order. Discussed prescriptions and follow up appointments with the patient. Prescriptions given to patient; medication list explained in detail. Patient verbalized understanding.  Skin clean, dry and intact without evidence of skin break down, no evidence of skin tears noted. IV catheter discontinued intact. Site without signs and symptoms of complications. Dressing and pressure applied. Pt denies pain at the site currently. No complaints noted.  Patient free of lines, drains, and wounds.   An After Visit Summary (AVS) was printed and given to the patient. Patient escorted via wheelchair, and discharged home via private auto.  Arlyss Repress, RN

## 2020-11-06 ENCOUNTER — Telehealth: Payer: Self-pay | Admitting: Nephrology

## 2020-11-06 LAB — BASIC METABOLIC PANEL
Anion gap: 14 (ref 5–15)
BUN: 96 mg/dL — ABNORMAL HIGH (ref 6–20)
CO2: 24 mmol/L (ref 22–32)
Calcium: 10.4 mg/dL — ABNORMAL HIGH (ref 8.9–10.3)
Chloride: 95 mmol/L — ABNORMAL LOW (ref 98–111)
Creatinine, Ser: 8.27 mg/dL — ABNORMAL HIGH (ref 0.44–1.00)
GFR, Estimated: 6 mL/min — ABNORMAL LOW (ref >60)
Glucose, Bld: 95 mg/dL (ref 70–99)
Potassium: 5.4 mmol/L — ABNORMAL HIGH (ref 3.5–5.1)
Sodium: 133 mmol/L — ABNORMAL LOW (ref 135–145)

## 2020-11-06 NOTE — Telephone Encounter (Signed)
Transition of care contact from inpatient facility  Date of discharge: 11/05/20 Date of contact: 11/06/20 Method: Phone  Spoke to: Patient  Patient contacted to discuss transition of care from recent inpatient hospitalization. Patient was admitted to [facility] from 11/6-11/9/21 with discharge diagnosis of volume overload, acute/chronic CHF with reduced EF.   Medication changes were reviewed.  Patient will follow up with his/her outpatient HD unit on: Thursday 11/10.

## 2020-11-08 NOTE — Discharge Summary (Signed)
Physician Discharge Summary  [name] CBJ:[id number] DOB: 12/07/1981 DOA: 11/02/2020  PCP: Patient, No Pcp Per  Admit date: 11/02/2020 Discharge date: 11/08/2020  Admitted From: home. ) Disposition:  Home  Recommendations for Outpatient Follow-up:  1. Follow up with PCP in 1-2 weeks 2. Please obtain BMP/CBC in one week Please follow up with cardiology for TEE PLEASE follow up with Nephrology as recommended.   Discharge Condition: guarded.  CODE STATUS: full code.  Diet recommendation: Heart Healthy    Brief/Interim Summary: 39 year old lady prior history of end-stage renal disease on dialysis TTS, combined systolic and diastolic heart failure, history of polysubstance abuse, history of lupus presents with progressive dyspnea.  She reports missing her dialysis the previous session.  On arrival to ED she was found to be hypoxic requiring up to 3 L of nasal cannula oxygen to keep sats greater than 90%. She underwent dialysis last night and her breathing has improved.  She is weaned off oxygen.  Patient seen and examined this morning she reports occasional chest pressure on exertion, breathing has improved but but she reports not being back to her baseline yet. An echocardiogram done earlier this morning showed left ventricular ejection fraction decreased from 45% to 30 to 35% this admission, moderately decreased function with global hypokinesis and moderate left ventricular hypertrophy.  Left ventricular diastolic parameters are indeterminate.  Her left atrial is severely dilated and right atrial was moderately dilated.  Cardiology consulted and they recommended TEE for evaluation of MV and AV to see if amenable for surgery.  But pt wanted to leave and follow up with cardiology as outpatient.  Nephrology and cardiology cleared for discharge.   Discharge Diagnoses:  Principal Problem:   Acute respiratory failure with hypoxia (HCC) Active Problems:   Benign hypertension with ESRD  (end-stage renal disease) (HCC)   Chronic combined systolic (congestive) and diastolic (congestive) heart failure (HCC)   ESRD (end stage renal disease) on dialysis (HCC)   Acute hyperkalemia   Polysubstance abuse (HCC)  Fluid overload probably secondary to missed HD/hyperkalemia She initially was hypoxic and required up to 3 L of nasal cannula oxygen.  Postdialysis she currently she is on room air with good sats.  Echocardiogram showed decreased left ventricular ejection fraction compared to 2018 echo, with LVEF of 30 to 35% with global hypokinesis.  Volume management as per dialysis. Cardiology  consulted for optimization of meds, recommended outpatient follow up with TEE.     Severe to moderate MR:  Further recommendations as per cardiology    Hyperkalemia Resolved.    Occasional chest pressure on exertion: Resolved now. recommend outpatient follow up with cardiology for work up.    Elevated troponins Probably from demand ischemia from fluid overload.   History of polysubstance abuse Patient denies using cocaine this admission.   Vaginal discharge; Will check for gonococcal/GC path in the urine.  As pt wanted to go home, recommend outpatient follow up with PCP for the results.     End-stage renal disease on dialysis Counseled on being compliant with hemodialysis sessions.   Essential hypertension;  Well controlled.     Discharge Instructions  Discharge Instructions    Diet - low sodium heart healthy   Complete by: As directed    Discharge instructions   Complete by: As directed    Recommend outpatient follow up with cardiology to schedule TEE.   Increase activity slowly   Complete by: As directed    No wound care   Complete by: As  directed      Allergies as of 11/05/2020      Reactions   Tobramycin Sulfate Swelling   Eye swelling      Medication List    TAKE these medications   calcitRIOL 0.5 MCG capsule Commonly known  as: ROCALTROL Take 4 capsules (2 mcg total) by mouth Every Tuesday,Thursday,and Saturday with dialysis.   calcium acetate 667 MG capsule Commonly known as: PHOSLO Take 1,334-2,668 mg by mouth See admin instructions. Take 4 capsule (2668 mg) by mouth three times a day with each meal and 2 capsules (1334 mg) with each snack   carvedilol 12.5 MG tablet Commonly known as: COREG Take 12.5 mg by mouth 2 (two) times daily with a meal.   diphenhydrAMINE 25 MG tablet Commonly known as: BENADRYL Take 25 mg by mouth at bedtime.   doxercalciferol 4 MCG/2ML injection Commonly known as: HECTOROL Use as recommended by nephrology   heparin 1000 unit/mL Soln injection 1,000 Units by Dialysis route one time in dialysis.   hydrOXYzine 10 MG tablet Commonly known as: ATARAX/VISTARIL Take 1 tablet (10 mg total) by mouth 3 (three) times daily as needed for anxiety. What changed: reasons to take this   loperamide 2 MG tablet Commonly known as: IMODIUM A-D Take 4 mg by mouth daily as needed for diarrhea or loose stools.   MIRCERA IJ Inject 1 Dose as directed every 14 (fourteen) days.   multivitamin Tabs tablet Take 1 tablet by mouth daily.   NovaSource Renal Liqd Take 237 mLs by mouth See admin instructions. Take 1 container (237 mls) by at bedtime on dialysis days (Tuesday, Thursday, Saturday)       Follow-up Information    Buford Dresser, MD. Schedule an appointment as soon as possible for a visit in 1 week(s).   Specialty: Cardiology Contact information: [address] [place] [address] [phone]              Allergies  Allergen Reactions  . Tobramycin Sulfate Swelling    Eye swelling    Consultations:  Cardiology  Nephrology.    Procedures/Studies: DG Chest 2 View  Result Date: 11/03/2020 CLINICAL DATA:  End-stage renal disease on dialysis, combined systolic and diastolic heart failure, polysubstance abuse, lupus, presents with  progressive dyspnea, hypoxemia, smoker EXAM: CHEST - 2 VIEW COMPARISON:  11/02/2020 FINDINGS: LEFT jugular dual-lumen central venous catheter with tip projecting over SVC. Enlargement of cardiac silhouette with slight pulmonary vascular congestion. Scattered interstitial edema, slightly improved. Small LEFT pleural effusion again identified. No segmental consolidation or pneumothorax. Osseous structures unremarkable. IMPRESSION: Slightly improved pulmonary edema. Small LEFT pleural effusion. Electronically Signed   By: Lavonia Dana M.D.   On: 11/03/2020 15:51   DG Chest Port 1 View  Result Date: 11/02/2020 CLINICAL DATA:  Chest pain shortness of breath EXAM: PORTABLE CHEST 1 VIEW COMPARISON:  July 31, 2020 FINDINGS: The heart size and mediastinal contours are mildly enlarged. Aortic knob calcifications are seen. Diffusely increased interstitial markings are seen throughout both lungs. A small left pleural effusion is present. A left-sided central venous catheter seen with the tip at the cavoatrial junction. IMPRESSION: Mild cardiomegaly and interstitial edema. Small left pleural effusion. Electronically Signed   By: Prudencio Pair M.D.   On: 11/02/2020 21:49   ECHOCARDIOGRAM COMPLETE  Result Date: 11/03/2020    ECHOCARDIOGRAM REPORT   Patient Name:   [name] Large Date of Exam: 11/03/2020 Medical Rec #:  [id number]      Height:  66.0 in Accession #:    [id number]     Weight:       105.5 lb Date of Birth:  11-22-1981     BSA:          1.524 m Patient Age:    38 years       BP:           163/96 mmHg Patient Gender: F              HR:           98 bpm. Exam Location:  Inpatient Procedure: 2D Echo, Cardiac Doppler and Color Doppler Indications:    Dyspnea 786.09 / R06.00  History:        Patient has prior history of Echocardiogram examinations, most                 recent 02/17/2017. Signs/Symptoms:Murmur and Dyspnea; Risk                 Factors:Current Smoker. Pericardial effusion. Pleural effusion.   Sonographer:    Vickie Epley RDCS Referring Phys: [id number] [place]  1. Left ventricular ejection fraction, by estimation, is 30 to 35%. The left ventricle has moderately decreased function. The left ventricle demonstrates global hypokinesis. The left ventricular internal cavity size was mildly dilated. There is moderate  left ventricular hypertrophy. Left ventricular diastolic parameters are indeterminate.  2. Right ventricular systolic function is low normal. The right ventricular size is mildly enlarged. There is mildly elevated pulmonary artery systolic pressure.  3. Left atrial size was severely dilated.  4. Right atrial size was moderately dilated.  5. Severe posterior mitral anular calcfication, with a calcified linear portion protruding into the left atrium. The MR vena contracta is 0.6 cm. Primary MR etiology looks to be functional due to tethering of posterior leaflet. Moderate mitral stenosis with mean gradient of 7 mmHg. . The mitral valve is abnormal. Moderate to severe mitral valve regurgitation. Moderate mitral stenosis. Severe mitral annular calcification.  6. The aortic valve is tricuspid. There is mild calcification of the aortic valve. There is mild thickening of the aortic valve. Aortic valve regurgitation is moderate. No aortic stenosis is present. FINDINGS  Left Ventricle: Left ventricular ejection fraction, by estimation, is 30 to 35%. The left ventricle has moderately decreased function. The left ventricle demonstrates global hypokinesis. The left ventricular internal cavity size was mildly dilated. There is moderate left ventricular hypertrophy. Left ventricular diastolic parameters are indeterminate. Right Ventricle: The right ventricular size is mildly enlarged. Right vetricular wall thickness was not assessed. Right ventricular systolic function is low normal. There is mildly elevated pulmonary artery systolic pressure. The tricuspid regurgitant velocity is 2.96 m/s,  and with an assumed right atrial pressure of 3 mmHg, the estimated right ventricular systolic pressure is 32.3 mmHg. Left Atrium: Left atrial size was severely dilated. Right Atrium: Right atrial size was moderately dilated. Pericardium: There is no evidence of pericardial effusion. Mitral Valve: Severe posterior mitral anular calcfication, with a calcified linear portion protruding into the left atrium. The MR vena contracta is 0.6 cm. Primary MR etiology looks to be functional due to tethering of posterior leaflet. Moderate mitral  stenosis with mean gradient of 7 mmHg. The mitral valve is abnormal. There is moderate thickening of the mitral valve leaflet(s). There is moderate calcification of the mitral valve leaflet(s). Severe mitral annular calcification. Moderate to severe mitral valve regurgitation. Moderate mitral valve stenosis. The mean mitral valve gradient is 7.6  mmHg with average heart rate of 99 bpm. Tricuspid Valve: The tricuspid valve is normal in structure. Tricuspid valve regurgitation is mild . No evidence of tricuspid stenosis. Aortic Valve: The aortic valve is tricuspid. There is mild calcification of the aortic valve. There is mild thickening of the aortic valve. There is mild aortic valve annular calcification. Aortic valve regurgitation is moderate. Aortic regurgitation PHT  measures 474 msec. No aortic stenosis is present. Aortic valve mean gradient measures 7.3 mmHg. Aortic valve peak gradient measures 14.0 mmHg. Aortic valve area, by VTI measures 3.26 cm. Pulmonic Valve: The pulmonic valve was not well visualized. Pulmonic valve regurgitation is trivial. No evidence of pulmonic stenosis. Aorta: The aortic root is normal in size and structure. Pulmonary Artery: 35.  IAS/Shunts: No atrial level shunt detected by color flow Doppler.  LEFT VENTRICLE PLAX 2D LVIDd:         6.10 cm LVIDs:         5.50 cm LV PW:         1.30 cm LV IVS:        1.30 cm LVOT diam:     2.40 cm LV SV:         84 LV  SV Index:   55 LVOT Area:     4.52 cm  LV Volumes (MOD) LV vol d, MOD A2C: 263.0 ml LV vol d, MOD A4C: 219.0 ml LV vol s, MOD A2C: 177.0 ml LV vol s, MOD A4C: 139.0 ml LV SV MOD A2C:     86.0 ml LV SV MOD A4C:     219.0 ml LV SV MOD BP:      90.0 ml RIGHT VENTRICLE RV S prime:     10.80 cm/s TAPSE (M-mode): 1.6 cm LEFT ATRIUM              Index        RIGHT ATRIUM           Index LA diam:        4.90 cm  3.22 cm/m   RA Area:     19.30 cm LA Vol (A2C):   149.0 ml 97.77 ml/m  RA Volume:   53.20 ml  34.91 ml/m LA Vol (A4C):   161.0 ml 105.64 ml/m LA Biplane Vol: 165.0 ml 108.27 ml/m  AORTIC VALVE AV Area (Vmax):    3.24 cm AV Area (Vmean):   3.16 cm AV Area (VTI):     3.26 cm AV Vmax:           187.03 cm/s AV Vmean:          126.357 cm/s AV VTI:            0.257 m AV Peak Grad:      14.0 mmHg AV Mean Grad:      7.3 mmHg LVOT Vmax:         134.00 cm/s LVOT Vmean:        88.200 cm/s LVOT VTI:          0.185 m LVOT/AV VTI ratio: 0.72 AI PHT:            474 msec  AORTA Ao Root diam: 3.50 cm MITRAL VALVE                 TRICUSPID VALVE MV Mean grad: 7.6 mmHg       TR Peak grad:   35.0 mmHg MR Peak grad:    119.7 mmHg  TR Vmax:  296.00 cm/s MR Mean grad:    83.0 mmHg MR Vmax:         547.00 cm/s SHUNTS MR Vmean:        434.0 cm/s  Systemic VTI:  0.18 m MR PISA:         4.02 cm    Systemic Diam: 2.40 cm MR PISA Eff ROA: 29 mm MR PISA Radius:  0.80 cm Carlyle Dolly MD Electronically signed by Carlyle Dolly MD Signature Date/Time: 11/03/2020/12:04:27 PM    Final       Subjective: Adamant about going home, does not want to have TEE done today.   Discharge Exam: Vitals:   11/05/20 1015 11/05/20 1038  BP: 132/77 (!) 133/93  Pulse: 78 84  Resp: 17 18  Temp: 98 F (36.7 C) 97.8 F (36.6 C)  SpO2: 99% 100%   Vitals:   11/05/20 0959 11/05/20 1000 11/05/20 1015 11/05/20 1038  BP:  96/78 132/77 (!) 133/93  Pulse: 77  78 84  Resp:  18 17 18   Temp: 98 F (36.7 C)  98 F (36.7 C) 97.8 F (36.6  C)  TempSrc: Oral  Oral Other (Comment)  SpO2: 97%  99% 100%  Weight:      Height:        General: Pt is alert, awake, not in acute distress Cardiovascular: RRR, S1/S2 +, no rubs, no gallops Respiratory: CTA bilaterally, no wheezing, no rhonchi Abdominal: Soft, NT, ND, bowel sounds + Extremities: no edema, no cyanosis    The results of significant diagnostics from this hospitalization (including imaging, microbiology, ancillary and laboratory) are listed below for reference.     Microbiology: Recent Results (from the past 240 hour(s))  Respiratory Panel by RT PCR (Flu A&B, Covid) - Nasopharyngeal Swab     Status: None   Collection Time: 11/02/20 10:58 PM   Specimen: Nasopharyngeal Swab  Result Value Ref Range Status   SARS Coronavirus 2 by RT PCR NEGATIVE NEGATIVE Final    Comment: (NOTE) SARS-CoV-2 target nucleic acids are NOT DETECTED.  The SARS-CoV-2 RNA is generally detectable in upper respiratoy specimens during the acute phase of infection. The lowest concentration of SARS-CoV-2 viral copies this assay can detect is 131 copies/mL. A negative result does not preclude SARS-Cov-2 infection and should not be used as the sole basis for treatment or other patient management decisions. A negative result may occur with  improper specimen collection/handling, submission of specimen other than nasopharyngeal swab, presence of viral mutation(s) within the areas targeted by this assay, and inadequate number of viral copies (<131 copies/mL). A negative result must be combined with clinical observations, patient history, and epidemiological information. The expected result is Negative.  Fact Sheet for Patients:  PinkCheek.be  Fact Sheet for Healthcare Providers:  GravelBags.it  This test is no t yet approved or cleared by the [place] FDA and  has been authorized for detection and/or diagnosis of SARS-CoV-2 by FDA  under an Emergency Use Authorization (EUA). This EUA will remain  in effect (meaning this test can be used) for the duration of the COVID-19 declaration under Section 564(b)(1) of the Act, 21 U.S.C. section 360bbb-3(b)(1), unless the authorization is terminated or revoked sooner.     Influenza A by PCR NEGATIVE NEGATIVE Final   Influenza B by PCR NEGATIVE NEGATIVE Final    Comment: (NOTE) The Xpert Xpress SARS-CoV-2/FLU/RSV assay is intended as an aid in  the diagnosis of influenza from Nasopharyngeal swab specimens and  should not be used as  a sole basis for treatment. Nasal washings and  aspirates are unacceptable for Xpert Xpress SARS-CoV-2/FLU/RSV  testing.  Fact Sheet for Patients: PinkCheek.be  Fact Sheet for Healthcare Providers: GravelBags.it  This test is not yet approved or cleared by the [place] FDA and  has been authorized for detection and/or diagnosis of SARS-CoV-2 by  FDA under an Emergency Use Authorization (EUA). This EUA will remain  in effect (meaning this test can be used) for the duration of the  Covid-19 declaration under Section 564(b)(1) of the Act, 21  U.S.C. section 360bbb-3(b)(1), unless the authorization is  terminated or revoked. Performed at [facility], [place] [address], [place], [place] [address]      Labs: BNP (last 3 results) Recent Labs    11/02/20 2114  BNP >2,563.8*   Basic Metabolic Panel: Recent Labs  Lab 11/02/20 2114 11/02/20 2121 11/02/20 2137 11/03/20 0450 11/05/20 0241  NA 136  --  139 132* 133*  K 5.9*  --  6.0* 3.1* 5.4*  CL 99  --  102 95* 95*  CO2 19*  --   --  27 24  GLUCOSE 84 85 89 79 95  BUN 75*  --  69* 12 96*  CREATININE 8.68*  --  10.30* 2.45* 8.27*  CALCIUM 9.7  --   --  8.8* 10.4*   Liver Function Tests: Recent Labs  Lab 11/02/20 2114 11/03/20 0450  AST 38 40  ALT 27 30  ALKPHOS 164* 181*  BILITOT 0.7 0.4  PROT 8.5* 9.8*   ALBUMIN 2.5* 2.7*   No results for input(s): LIPASE, AMYLASE in the last 168 hours. No results for input(s): AMMONIA in the last 168 hours. CBC: Recent Labs  Lab 11/02/20 2114 11/02/20 2137 11/03/20 0450 11/05/20 0241  WBC 5.0  --  4.9 4.6  NEUTROABS 3.0  --   --   --   HGB 10.7* 12.2 10.9* 10.0*  HCT 36.6 36.0 35.8* 32.3*  MCV 88.6  --  86.3 85.0  PLT 292  --  301 279   Cardiac Enzymes: No results for input(s): CKTOTAL, CKMB, CKMBINDEX, TROPONINI in the last 168 hours. BNP: Invalid input(s): POCBNP CBG: Recent Labs  Lab 11/02/20 2226 11/02/20 2311  GLUCAP 53* 64*   D-Dimer No results for input(s): DDIMER in the last 72 hours. Hgb A1c No results for input(s): HGBA1C in the last 72 hours. Lipid Profile No results for input(s): CHOL, HDL, LDLCALC, TRIG, CHOLHDL, LDLDIRECT in the last 72 hours. Thyroid function studies No results for input(s): TSH, T4TOTAL, T3FREE, THYROIDAB in the last 72 hours.  Invalid input(s): FREET3 Anemia work up No results for input(s): VITAMINB12, FOLATE, FERRITIN, TIBC, IRON, RETICCTPCT in the last 72 hours. Urinalysis    Component Value Date/Time   COLORURINE YELLOW 09/25/2015 1412   APPEARANCEUR CLOUDY (A) 09/25/2015 1412   LABSPEC 1.018 09/25/2015 1412   PHURINE 8.0 09/25/2015 1412   GLUCOSEU NEGATIVE 09/25/2015 1412   HGBUR LARGE (A) 09/25/2015 1412   BILIRUBINUR NEGATIVE 09/25/2015 1412   KETONESUR NEGATIVE 09/25/2015 1412   PROTEINUR >300 (A) 09/25/2015 1412   UROBILINOGEN 0.2 09/25/2015 1412   NITRITE NEGATIVE 09/25/2015 1412   LEUKOCYTESUR MODERATE (A) 09/25/2015 1412   Sepsis Labs Invalid input(s): PROCALCITONIN,  WBC,  LACTICIDVEN Microbiology Recent Results (from the past 240 hour(s))  Respiratory Panel by RT PCR (Flu A&B, Covid) - Nasopharyngeal Swab     Status: None   Collection Time: 11/02/20 10:58 PM   Specimen: Nasopharyngeal Swab  Result Value Ref Range Status   SARS Coronavirus 2 by RT PCR NEGATIVE NEGATIVE  Final    Comment: (NOTE) SARS-CoV-2 target nucleic acids are NOT DETECTED.  The SARS-CoV-2 RNA is generally detectable in upper respiratoy specimens during the acute phase of infection. The lowest concentration of SARS-CoV-2 viral copies this assay can detect is 131 copies/mL. A negative result does not preclude SARS-Cov-2 infection and should not be used as the sole basis for treatment or other patient management decisions. A negative result may occur with  improper specimen collection/handling, submission of specimen other than nasopharyngeal swab, presence of viral mutation(s) within the areas targeted by this assay, and inadequate number of viral copies (<131 copies/mL). A negative result must be combined with clinical observations, patient history, and epidemiological information. The expected result is Negative.  Fact Sheet for Patients:  PinkCheek.be  Fact Sheet for Healthcare Providers:  GravelBags.it  This test is no t yet approved or cleared by the [place] FDA and  has been authorized for detection and/or diagnosis of SARS-CoV-2 by FDA under an Emergency Use Authorization (EUA). This EUA will remain  in effect (meaning this test can be used) for the duration of the COVID-19 declaration under Section 564(b)(1) of the Act, 21 U.S.C. section 360bbb-3(b)(1), unless the authorization is terminated or revoked sooner.     Influenza A by PCR NEGATIVE NEGATIVE Final   Influenza B by PCR NEGATIVE NEGATIVE Final    Comment: (NOTE) The Xpert Xpress SARS-CoV-2/FLU/RSV assay is intended as an aid in  the diagnosis of influenza from Nasopharyngeal swab specimens and  should not be used as a sole basis for treatment. Nasal washings and  aspirates are unacceptable for Xpert Xpress SARS-CoV-2/FLU/RSV  testing.  Fact Sheet for Patients: PinkCheek.be  Fact Sheet for Healthcare  Providers: GravelBags.it  This test is not yet approved or cleared by the [place] FDA and  has been authorized for detection and/or diagnosis of SARS-CoV-2 by  FDA under an Emergency Use Authorization (EUA). This EUA will remain  in effect (meaning this test can be used) for the duration of the  Covid-19 declaration under Section 564(b)(1) of the Act, 21  U.S.C. section 360bbb-3(b)(1), unless the authorization is  terminated or revoked. Performed at [facility], [place] [address], [place], [place] [address]      Time coordinating discharge:32 minutes.   SIGNED:   Hosie Poisson, MD  [facility] 11/08/2020, 11:00 AM

## 2020-11-28 ENCOUNTER — Other Ambulatory Visit: Payer: Self-pay

## 2020-11-28 DIAGNOSIS — N186 End stage renal disease: Secondary | ICD-10-CM

## 2020-11-29 ENCOUNTER — Ambulatory Visit: Payer: Medicaid Other | Admitting: Cardiology

## 2020-12-04 ENCOUNTER — Ambulatory Visit: Payer: Medicaid Other

## 2020-12-04 ENCOUNTER — Encounter (HOSPITAL_COMMUNITY): Payer: Medicaid Other

## 2021-01-01 ENCOUNTER — Other Ambulatory Visit: Payer: Self-pay

## 2021-01-01 ENCOUNTER — Ambulatory Visit (INDEPENDENT_AMBULATORY_CARE_PROVIDER_SITE_OTHER): Payer: Medicaid Other | Admitting: Physician Assistant

## 2021-01-01 ENCOUNTER — Ambulatory Visit (HOSPITAL_COMMUNITY)
Admission: RE | Admit: 2021-01-01 | Discharge: 2021-01-01 | Disposition: A | Discharge status: Home / Self Care | Payer: Medicaid Other | Source: Ambulatory Visit | Attending: Vascular Surgery | Admitting: Vascular Surgery

## 2021-01-01 VITALS — BP 182/119 | HR 83 | Temp 97.9°F | Resp 20 | Ht 66.0 in | Wt 107.0 lb

## 2021-01-01 DIAGNOSIS — N186 End stage renal disease: Secondary | ICD-10-CM | POA: Insufficient documentation

## 2021-01-01 DIAGNOSIS — Z992 Dependence on renal dialysis: Secondary | ICD-10-CM

## 2021-01-01 NOTE — Progress Notes (Signed)
vasc

## 2021-01-01 NOTE — H&P (View-Only) (Signed)
Established Dialysis Access   History of Present Illness   [name] is a 40 y.o. (1981/12/05) female who presents for re-evaluation of permanent access.  She is status post right first stage basilic vein fistula creation by Dr. Donnetta Hutching on 08/01/2020.  She denies signs or symptoms of steal syndrome in her right hand.  She she is currently dialyzing via left IJ [facility] on a Tuesday Thursday Saturday schedule.  Prior access includes a left brachiocephalic fistula that was ligated by Dr. Scot Dock on 07/28/2020 due to bleeding.  She is willing to proceed with with second stage basilic vein transposition.  L IJ TDC placed by Dr. Donzetta Matters 05/19/20.  The patient's PMH, PSH, SH, and FamHx were reviewed and are unchanged from prior visit.  Current Outpatient Medications  Medication Sig Dispense Refill  . calcitRIOL (ROCALTROL) 0.5 MCG capsule Take 4 capsules (2 mcg total) by mouth Every Tuesday,Thursday,and Saturday with dialysis.    Marland Kitchen calcium acetate (PHOSLO) 667 MG capsule Take 1,334-2,668 mg by mouth See admin instructions. Take 4 capsule (2668 mg) by mouth three times a day with each meal and 2 capsules (1334 mg) with each snack    . carvedilol (COREG) 12.5 MG tablet Take 12.5 mg by mouth 2 (two) times daily with a meal.     . diphenhydrAMINE (BENADRYL) 25 MG tablet Take 25 mg by mouth at bedtime.     Marland Kitchen doxercalciferol (HECTOROL) 4 MCG/2ML injection Use as recommended by nephrology 2 mL   . heparin 1000 unit/mL SOLN injection 1,000 Units by Dialysis route one time in dialysis.     . hydrOXYzine (ATARAX/VISTARIL) 10 MG tablet Take 1 tablet (10 mg total) by mouth 3 (three) times daily as needed for anxiety. (Patient taking differently: Take 10 mg by mouth 3 (three) times daily as needed for itching or anxiety.) 5 tablet 0  . loperamide (IMODIUM A-D) 2 MG tablet Take 4 mg by mouth daily as needed for diarrhea or loose stools.     . Methoxy PEG-Epoetin Beta (MIRCERA IJ) Inject 1 Dose as directed every 14  (fourteen) days.    . multivitamin (RENA-VIT) TABS tablet Take 1 tablet by mouth daily.    . Nutritional Supplements (NOVASOURCE RENAL) LIQD Take 237 mLs by mouth See admin instructions. Take 1 container (237 mls) by at bedtime on dialysis days (Tuesday, Thursday, Saturday)     No current facility-administered medications for this visit.    REVIEW OF SYSTEMS (negative unless checked):   Cardiac:  []  Chest pain or chest pressure? []  Shortness of breath upon activity? []  Shortness of breath when lying flat? []  Irregular heart rhythm?  Vascular:  []  Pain in calf, thigh, or hip brought on by walking? []  Pain in feet at night that wakes you up from your sleep? []  Blood clot in your veins? []  Leg swelling?  Pulmonary:  []  Oxygen at home? []  Productive cough? []  Wheezing?  Neurologic:  []  Sudden weakness in arms or legs? []  Sudden numbness in arms or legs? []  Sudden onset of difficult speaking or slurred speech? []  Temporary loss of vision in one eye? []  Problems with dizziness?  Gastrointestinal:  []  Blood in stool? []  Vomited blood?  Genitourinary:  []  Burning when urinating? []  Blood in urine?  Psychiatric:  []  Major depression  Hematologic:  []  Bleeding problems? []  Problems with blood clotting?  Dermatologic:  []  Rashes or ulcers?  Constitutional:  []  Fever or chills?  Ear/Nose/Throat:  []  Change in hearing? []  Nose  bleeds? []  Sore throat?  Musculoskeletal:  []  Back pain? []  Joint pain? []  Muscle pain?   Physical Examination   Vitals:   01/01/21 1130  BP: (!) 182/119  Pulse: 83  Resp: 20  Temp: 97.9 F (36.6 C)  SpO2: 97%  Weight: 107 lb (48.5 kg)  Height: 5\' 6"  (1.676 m)   Body mass index is 17.27 kg/m.  General:  WDWN in NAD; vital signs documented above Gait: Not observed HENT: WNL, normocephalic Pulmonary: normal non-labored breathing , without Rales, rhonchi,  wheezing Cardiac: regular HR Abdomen: soft, NT, no masses Skin:  without rashes Extremities: symmetrical radial pulses;  No palpable flow in L arm fistula; palpable thrill R arm fistula Musculoskeletal: no muscle wasting or atrophy  Neurologic: A&O X 3;  No focal weakness or paresthesias are detected Psychiatric:  The pt has Normal affect.   Non-invasive Vascular Imaging    R arm AVF patent and adequate diameter   Medical Decision Making   [name] is a 40 y.o. female who presents with ESRD requiring hemodialysis.    L arm fistula was ligated due to bleeding episode; incision has healed  Patent R arm basilic vein fistula with good thrill and no sign or symptoms of steal  Based on duplex, fistula has matured nicely  Plan is for R arm 2nd stage basilic vein transposition with Dr. Donnetta Hutching on a non-dialysis day in the next 1-2 weeks Risk, benefits, and alternatives to access surgery were discussed.   The patient is aware the risks include but are not limited to: bleeding, infection, steal syndrome, nerve damage, thrombosis, failure to mature, and need for additional procedures.   The patient agrees to proceed with the procedure.   Dagoberto Ligas PA-C [facility] [place] Office: [phone]  Clinic MD: Oneida Alar

## 2021-01-01 NOTE — Progress Notes (Signed)
Established Dialysis Access   History of Present Illness   [name] is a 40 y.o. ([birth year]) female who presents for re-evaluation of permanent access.  She is status post right first stage basilic vein fistula creation by Dr. Donnetta Hutching on 08/01/2020.  She denies signs or symptoms of steal syndrome in her right hand.  She she is currently dialyzing via left IJ [facility] on a Tuesday Thursday Saturday schedule.  Prior access includes a left brachiocephalic fistula that was ligated by Dr. Scot Dock on 07/28/2020 due to bleeding.  She is willing to proceed with with second stage basilic vein transposition.  L IJ TDC placed by Dr. Donzetta Matters 05/19/20.  The patient's PMH, PSH, SH, and FamHx were reviewed and are unchanged from prior visit.  Current Outpatient Medications  Medication Sig Dispense Refill  . calcitRIOL (ROCALTROL) 0.5 MCG capsule Take 4 capsules (2 mcg total) by mouth Every Tuesday,Thursday,and Saturday with dialysis.    Marland Kitchen calcium acetate (PHOSLO) 667 MG capsule Take 1,334-2,668 mg by mouth See admin instructions. Take 4 capsule (2668 mg) by mouth three times a day with each meal and 2 capsules (1334 mg) with each snack    . carvedilol (COREG) 12.5 MG tablet Take 12.5 mg by mouth 2 (two) times daily with a meal.     . diphenhydrAMINE (BENADRYL) 25 MG tablet Take 25 mg by mouth at bedtime.     Marland Kitchen doxercalciferol (HECTOROL) 4 MCG/2ML injection Use as recommended by nephrology 2 mL   . heparin 1000 unit/mL SOLN injection 1,000 Units by Dialysis route one time in dialysis.     . hydrOXYzine (ATARAX/VISTARIL) 10 MG tablet Take 1 tablet (10 mg total) by mouth 3 (three) times daily as needed for anxiety. (Patient taking differently: Take 10 mg by mouth 3 (three) times daily as needed for itching or anxiety.) 5 tablet 0  . loperamide (IMODIUM A-D) 2 MG tablet Take 4 mg by mouth daily as needed for diarrhea or loose stools.     . Methoxy PEG-Epoetin Beta (MIRCERA IJ) Inject 1 Dose as directed every 14  (fourteen) days.    . multivitamin (RENA-VIT) TABS tablet Take 1 tablet by mouth daily.    . Nutritional Supplements (NOVASOURCE RENAL) LIQD Take 237 mLs by mouth See admin instructions. Take 1 container (237 mls) by at bedtime on dialysis days (Tuesday, Thursday, Saturday)     No current facility-administered medications for this visit.    REVIEW OF SYSTEMS (negative unless checked):   Cardiac:  []  Chest pain or chest pressure? []  Shortness of breath upon activity? []  Shortness of breath when lying flat? []  Irregular heart rhythm?  Vascular:  []  Pain in calf, thigh, or hip brought on by walking? []  Pain in feet at night that wakes you up from your sleep? []  Blood clot in your veins? []  Leg swelling?  Pulmonary:  []  Oxygen at home? []  Productive cough? []  Wheezing?  Neurologic:  []  Sudden weakness in arms or legs? []  Sudden numbness in arms or legs? []  Sudden onset of difficult speaking or slurred speech? []  Temporary loss of vision in one eye? []  Problems with dizziness?  Gastrointestinal:  []  Blood in stool? []  Vomited blood?  Genitourinary:  []  Burning when urinating? []  Blood in urine?  Psychiatric:  []  Major depression  Hematologic:  []  Bleeding problems? []  Problems with blood clotting?  Dermatologic:  []  Rashes or ulcers?  Constitutional:  []  Fever or chills?  Ear/Nose/Throat:  []  Change in hearing? []  Nose  bleeds? []  Sore throat?  Musculoskeletal:  []  Back pain? []  Joint pain? []  Muscle pain?   Physical Examination   Vitals:   01/01/21 1130  BP: (!) 182/119  Pulse: 83  Resp: 20  Temp: 97.9 F (36.6 C)  SpO2: 97%  Weight: 107 lb (48.5 kg)  Height: 5\' 6"  (1.676 m)   Body mass index is 17.27 kg/m.  General:  WDWN in NAD; vital signs documented above Gait: Not observed HENT: WNL, normocephalic Pulmonary: normal non-labored breathing , without Rales, rhonchi,  wheezing Cardiac: regular HR Abdomen: soft, NT, no masses Skin:  without rashes Extremities: symmetrical radial pulses;  No palpable flow in L arm fistula; palpable thrill R arm fistula Musculoskeletal: no muscle wasting or atrophy  Neurologic: A&O X 3;  No focal weakness or paresthesias are detected Psychiatric:  The pt has Normal affect.   Non-invasive Vascular Imaging    R arm AVF patent and adequate diameter   Medical Decision Making   [name] is a 40 y.o. female who presents with ESRD requiring hemodialysis.    L arm fistula was ligated due to bleeding episode; incision has healed  Patent R arm basilic vein fistula with good thrill and no sign or symptoms of steal  Based on duplex, fistula has matured nicely  Plan is for R arm 2nd stage basilic vein transposition with Dr. Donnetta Hutching on a non-dialysis day in the next 1-2 weeks Risk, benefits, and alternatives to access surgery were discussed.   The patient is aware the risks include but are not limited to: bleeding, infection, steal syndrome, nerve damage, thrombosis, failure to mature, and need for additional procedures.   The patient agrees to proceed with the procedure.   Dagoberto Ligas PA-C [facility] [place] Office: [phone]  Clinic MD: Oneida Alar

## 2021-01-03 ENCOUNTER — Ambulatory Visit: Payer: Medicaid Other | Admitting: Cardiology

## 2021-01-07 ENCOUNTER — Other Ambulatory Visit (HOSPITAL_COMMUNITY)
Admission: RE | Admit: 2021-01-07 | Discharge: 2021-01-07 | Disposition: A | Discharge status: Home / Self Care | Payer: Medicaid Other | Source: Ambulatory Visit | Attending: Vascular Surgery | Admitting: Vascular Surgery

## 2021-01-07 ENCOUNTER — Other Ambulatory Visit (HOSPITAL_COMMUNITY): Payer: Medicaid Other

## 2021-01-07 ENCOUNTER — Encounter (HOSPITAL_COMMUNITY): Payer: Self-pay | Admitting: Vascular Surgery

## 2021-01-07 DIAGNOSIS — Z20822 Contact with and (suspected) exposure to covid-19: Secondary | ICD-10-CM | POA: Insufficient documentation

## 2021-01-07 DIAGNOSIS — Z01812 Encounter for preprocedural laboratory examination: Secondary | ICD-10-CM | POA: Insufficient documentation

## 2021-01-07 LAB — SARS CORONAVIRUS 2 (TAT 6-24 HRS): SARS Coronavirus 2: NEGATIVE

## 2021-01-07 NOTE — Progress Notes (Signed)
Anesthesia Chart Review: Same day workup  History of ESRD on HD secondary to lupus.  Patient was hospitalized in November 2021 for fluid overload secondary to missed dialysis.  During that hospitalization she had an echocardiogram that showed decreased left ventricular ejection fraction compared to 2018 echo, with LVEF of 30 to 35% with global hypokinesis.  There was also significant valvular abnormalities with moderate aortic regurgitation, moderate mitral stenosis, moderate-severe regurgitation.  During hospitalization she was evaluated cardiology, follow-up TEE was recommended to evaluate for possible improvement in mitral regurgitation after treatment of acute fluid overload, it was planned to be done as outpatient.  The patient however has not followed up with cardiology to have this done.  Remote history of pericarditis with pericardial effusion s/p pericardiocentesis.  Patient currently dialyzing via left IJ [facility] on Tuesday Thursday Saturday.  Patient history reviewed with Dr. Marcie Bal.  He advised okay to proceed with surgery as planned given the nature of procedure, patient will need day of surgery evaluation by assigned anesthesiologist.  She will need day of surgery labs.  EKG 11/02/2020 (read per Dr. Trilby Drummer Alexander's note 11/02/2020): Sinus tachycardia with peaked T waves similar to previous episode of hyperkalemia.  Normal QRS duration.  TTE 11/03/20: IMPRESSIONS  1. Left ventricular ejection fraction, by estimation, is 30 to 35%. The  left ventricle has moderately decreased function. The left ventricle  demonstrates global hypokinesis. The left ventricular internal cavity size  was mildly dilated. There is moderate  left ventricular hypertrophy. Left ventricular diastolic parameters are  indeterminate.  2. Right ventricular systolic function is low normal. The right  ventricular size is mildly enlarged. There is mildly elevated pulmonary  artery systolic pressure.  3. Left  atrial size was severely dilated.  4. Right atrial size was moderately dilated.  5. Severe posterior mitral anular calcfication, with a calcified linear  portion protruding into the left atrium. The MR vena contracta is 0.6 cm.  Primary MR etiology looks to be functional due to tethering of posterior  leaflet. Moderate mitral stenosis  with mean gradient of 7 mmHg. . The mitral valve is abnormal. Moderate to  severe mitral valve regurgitation. Moderate mitral stenosis. Severe mitral  annular calcification.  6. The aortic valve is tricuspid. There is mild calcification of the  aortic valve. There is mild thickening of the aortic valve. Aortic valve  regurgitation is moderate. No aortic stenosis is present.    Wynonia Musty [facility] Short Stay Center/Anesthesiology Phone [phone] 01/07/2021 2:35 PM

## 2021-01-07 NOTE — Anesthesia Preprocedure Evaluation (Addendum)
Anesthesia Evaluation  Patient identified by MRN, date of birth, ID band Patient awake    Reviewed: Allergy & Precautions, NPO status , Patient's Chart, lab work & pertinent test results  Airway Mallampati: II  TM Distance: >3 FB     Dental   Pulmonary shortness of breath, pneumonia, Current Smoker and Patient abstained from smoking.,    breath sounds clear to auscultation       Cardiovascular hypertension, +CHF  + Valvular Problems/Murmurs  Rhythm:Regular Rate:Normal     Neuro/Psych  Headaches, Seizures -,  Anxiety    GI/Hepatic negative GI ROS, Neg liver ROS,   Endo/Other    Renal/GU Renal disease     Musculoskeletal  (+) Arthritis ,   Abdominal   Peds  Hematology  (+) anemia ,   Anesthesia Other Findings   Reproductive/Obstetrics                            Anesthesia Physical Anesthesia Plan  ASA: III  Anesthesia Plan: MAC   Post-op Pain Management:    Induction: Intravenous  PONV Risk Score and Plan: 3 and Ondansetron, Dexamethasone and Midazolam  Airway Management Planned: Nasal Cannula and Simple Face Mask  Additional Equipment:   Intra-op Plan:   Post-operative Plan: Extubation in OR  Informed Consent: I have reviewed the patients History and Physical, chart, labs and discussed the procedure including the risks, benefits and alternatives for the proposed anesthesia with the patient or authorized representative who has indicated his/her understanding and acceptance.     Dental advisory given  Plan Discussed with: CRNA and Anesthesiologist  Anesthesia Plan Comments: (PAT note by Karoline Caldwell, PA-C:  History of ESRD on HD secondary to lupus.  Patient was hospitalized in November 2021 for fluid overload secondary to missed dialysis.  During that hospitalization she had an echocardiogram that showed decreased left ventricular ejection fraction compared to 2018 echo, with  LVEF of 30 to 35% with global hypokinesis.  There was also significant valvular abnormalities with moderate aortic regurgitation, moderate mitral stenosis, moderate-severe mitral regurgitation.  During hospitalization she was evaluated cardiology, follow-up TEE was recommended to evaluate for possible improvement in mitral regurgitation after treatment of acute fluid overload, it was planned to be done as outpatient.  The patient however has not followed up with cardiology to have this done.  Remote history of pericarditis with pericardial effusion s/p pericardiocentesis.  Patient currently dialyzing via left IJ [facility] on Tuesday Thursday Saturday.  Patient history reviewed with Dr. Marcie Bal.  He advised okay to proceed with surgery as planned given the nature of procedure, patient will need day of surgery evaluation by assigned anesthesiologist.  She will need day of surgery labs.  EKG 11/02/2020 (read per Dr. Trilby Drummer Alexander's note 11/02/2020): Sinus tachycardia with peaked T waves similar to previous episode of hyperkalemia.  Normal QRS duration.  TTE 11/03/20: IMPRESSIONS  1. Left ventricular ejection fraction, by estimation, is 30 to 35%. The  left ventricle has moderately decreased function. The left ventricle  demonstrates global hypokinesis. The left ventricular internal cavity size  was mildly dilated. There is moderate  left ventricular hypertrophy. Left ventricular diastolic parameters are  indeterminate.  2. Right ventricular systolic function is low normal. The right  ventricular size is mildly enlarged. There is mildly elevated pulmonary  artery systolic pressure.  3. Left atrial size was severely dilated.  4. Right atrial size was moderately dilated.  5. Severe posterior mitral anular calcfication, with  a calcified linear  portion protruding into the left atrium. The MR vena contracta is 0.6 cm.  Primary MR etiology looks to be functional due to tethering of posterior   leaflet. Moderate mitral stenosis  with mean gradient of 7 mmHg. . The mitral valve is abnormal. Moderate to  severe mitral valve regurgitation. Moderate mitral stenosis. Severe mitral  annular calcification.  6. The aortic valve is tricuspid. There is mild calcification of the  aortic valve. There is mild thickening of the aortic valve. Aortic valve  regurgitation is moderate. No aortic stenosis is present.   )       Anesthesia Quick Evaluation

## 2021-01-08 ENCOUNTER — Ambulatory Visit (HOSPITAL_COMMUNITY): Payer: Medicaid Other | Admitting: Physician Assistant

## 2021-01-08 ENCOUNTER — Other Ambulatory Visit: Payer: Self-pay

## 2021-01-08 ENCOUNTER — Encounter (HOSPITAL_COMMUNITY): Payer: Self-pay | Admitting: Vascular Surgery

## 2021-01-08 ENCOUNTER — Encounter (HOSPITAL_COMMUNITY)
Admission: RE | Disposition: A | Discharge status: Home / Self Care | Payer: Self-pay | Source: Home / Self Care | Attending: Vascular Surgery

## 2021-01-08 ENCOUNTER — Ambulatory Visit (HOSPITAL_COMMUNITY)
Admission: RE | Admit: 2021-01-08 | Discharge: 2021-01-08 | Disposition: A | Discharge status: Home / Self Care | Payer: Medicaid Other | Attending: Vascular Surgery | Admitting: Vascular Surgery

## 2021-01-08 ENCOUNTER — Other Ambulatory Visit (HOSPITAL_COMMUNITY): Payer: Self-pay | Admitting: Physician Assistant

## 2021-01-08 DIAGNOSIS — Z79899 Other long term (current) drug therapy: Secondary | ICD-10-CM | POA: Diagnosis not present

## 2021-01-08 DIAGNOSIS — D631 Anemia in chronic kidney disease: Secondary | ICD-10-CM | POA: Diagnosis not present

## 2021-01-08 DIAGNOSIS — I052 Rheumatic mitral stenosis with insufficiency: Secondary | ICD-10-CM | POA: Insufficient documentation

## 2021-01-08 DIAGNOSIS — N186 End stage renal disease: Secondary | ICD-10-CM | POA: Diagnosis not present

## 2021-01-08 DIAGNOSIS — N185 Chronic kidney disease, stage 5: Secondary | ICD-10-CM | POA: Diagnosis not present

## 2021-01-08 DIAGNOSIS — Z992 Dependence on renal dialysis: Secondary | ICD-10-CM | POA: Diagnosis not present

## 2021-01-08 DIAGNOSIS — I132 Hypertensive heart and chronic kidney disease with heart failure and with stage 5 chronic kidney disease, or end stage renal disease: Secondary | ICD-10-CM | POA: Insufficient documentation

## 2021-01-08 DIAGNOSIS — I509 Heart failure, unspecified: Secondary | ICD-10-CM | POA: Insufficient documentation

## 2021-01-08 HISTORY — PX: BASCILIC VEIN TRANSPOSITION: SHX5742

## 2021-01-08 HISTORY — DX: Unspecified osteoarthritis, unspecified site: M19.90

## 2021-01-08 HISTORY — DX: Anxiety disorder, unspecified: F41.9

## 2021-01-08 LAB — POCT I-STAT, CHEM 8
BUN: 50 mg/dL — ABNORMAL HIGH (ref 6–20)
Calcium, Ion: 1.14 mmol/L — ABNORMAL LOW (ref 1.15–1.40)
Chloride: 97 mmol/L — ABNORMAL LOW (ref 98–111)
Creatinine, Ser: 7.9 mg/dL — ABNORMAL HIGH (ref 0.44–1.00)
Glucose, Bld: 71 mg/dL (ref 70–99)
HCT: 45 % (ref 36.0–46.0)
Hemoglobin: 15.3 g/dL — ABNORMAL HIGH (ref 12.0–15.0)
Potassium: 4.6 mmol/L (ref 3.5–5.1)
Sodium: 136 mmol/L (ref 135–145)
TCO2: 32 mmol/L (ref 22–32)

## 2021-01-08 LAB — HCG, SERUM, QUALITATIVE: Preg, Serum: NEGATIVE

## 2021-01-08 SURGERY — TRANSPOSITION, VEIN, BASILIC
Anesthesia: Monitor Anesthesia Care | Site: Arm Upper | Laterality: Right

## 2021-01-08 MED ORDER — FENTANYL CITRATE (PF) 250 MCG/5ML IJ SOLN
INTRAMUSCULAR | Status: AC
  Filled 2021-01-08: qty 5

## 2021-01-08 MED ORDER — DEXAMETHASONE SODIUM PHOSPHATE 10 MG/ML IJ SOLN
INTRAMUSCULAR | Status: AC
  Filled 2021-01-08: qty 1

## 2021-01-08 MED ORDER — FENTANYL CITRATE (PF) 250 MCG/5ML IJ SOLN
INTRAMUSCULAR | Status: DC | PRN
  Administered 2021-01-08 (×5): 25 ug via INTRAVENOUS

## 2021-01-08 MED ORDER — 0.9 % SODIUM CHLORIDE (POUR BTL) OPTIME
TOPICAL | Status: DC | PRN
  Administered 2021-01-08: 1000 mL

## 2021-01-08 MED ORDER — LIDOCAINE 2% (20 MG/ML) 5 ML SYRINGE
INTRAMUSCULAR | Status: DC | PRN
  Administered 2021-01-08: 60 mg via INTRAVENOUS

## 2021-01-08 MED ORDER — EPHEDRINE SULFATE-NACL 50-0.9 MG/10ML-% IV SOSY
PREFILLED_SYRINGE | INTRAVENOUS | Status: DC | PRN
  Administered 2021-01-08: 10 mg via INTRAVENOUS

## 2021-01-08 MED ORDER — CHLORHEXIDINE GLUCONATE 4 % EX LIQD: 60.0000 mL | Freq: Once | CUTANEOUS | Status: DC

## 2021-01-08 MED ORDER — PROPOFOL 500 MG/50ML IV EMUL
INTRAVENOUS | Status: DC | PRN
  Administered 2021-01-08: 75 ug/kg/min via INTRAVENOUS

## 2021-01-08 MED ORDER — SODIUM CHLORIDE 0.9 % IV SOLN
INTRAVENOUS | Status: DC
  Administered 2021-01-08: 10 mL/h via INTRAVENOUS

## 2021-01-08 MED ORDER — OXYCODONE-ACETAMINOPHEN 5-325 MG PO TABS
1.0000 | ORAL_TABLET | Freq: Once | ORAL | Status: AC
  Administered 2021-01-08: 1 via ORAL

## 2021-01-08 MED ORDER — PHENYLEPHRINE HCL-NACL 10-0.9 MG/250ML-% IV SOLN
INTRAVENOUS | Status: DC | PRN
  Administered 2021-01-08: 20 ug/min via INTRAVENOUS

## 2021-01-08 MED ORDER — SODIUM CHLORIDE 0.9 % IV SOLN
INTRAVENOUS | Status: AC
  Filled 2021-01-08: qty 1.2

## 2021-01-08 MED ORDER — CHLORHEXIDINE GLUCONATE 0.12 % MT SOLN
OROMUCOSAL | Status: AC
  Administered 2021-01-08: 15 mL via OROMUCOSAL
  Filled 2021-01-08: qty 15

## 2021-01-08 MED ORDER — ONDANSETRON HCL 4 MG/2ML IJ SOLN
INTRAMUSCULAR | Status: AC
  Filled 2021-01-08: qty 2

## 2021-01-08 MED ORDER — CEFAZOLIN SODIUM-DEXTROSE 2-4 GM/100ML-% IV SOLN
2.0000 g | INTRAVENOUS | Status: AC
  Administered 2021-01-08: 2 g via INTRAVENOUS
  Filled 2021-01-08: qty 100

## 2021-01-08 MED ORDER — STERILE WATER FOR IRRIGATION IR SOLN
Status: DC | PRN
  Administered 2021-01-08: 1000 mL

## 2021-01-08 MED ORDER — PROPOFOL 10 MG/ML IV BOLUS
INTRAVENOUS | Status: DC | PRN
  Administered 2021-01-08: 180 mg via INTRAVENOUS
  Administered 2021-01-08: 30 mg via INTRAVENOUS
  Administered 2021-01-08: 10 mg via INTRAVENOUS

## 2021-01-08 MED ORDER — LIDOCAINE 2% (20 MG/ML) 5 ML SYRINGE
INTRAMUSCULAR | Status: AC
  Filled 2021-01-08: qty 5

## 2021-01-08 MED ORDER — FENTANYL CITRATE (PF) 100 MCG/2ML IJ SOLN
INTRAMUSCULAR | Status: AC
  Filled 2021-01-08: qty 2

## 2021-01-08 MED ORDER — PROPOFOL 10 MG/ML IV BOLUS
INTRAVENOUS | Status: AC
  Filled 2021-01-08: qty 40

## 2021-01-08 MED ORDER — MIDAZOLAM HCL 2 MG/2ML IJ SOLN
INTRAMUSCULAR | Status: DC | PRN
  Administered 2021-01-08: 1 mg via INTRAVENOUS

## 2021-01-08 MED ORDER — EPHEDRINE 5 MG/ML INJ
INTRAVENOUS | Status: AC
  Filled 2021-01-08: qty 10

## 2021-01-08 MED ORDER — ONDANSETRON HCL 4 MG/2ML IJ SOLN
INTRAMUSCULAR | Status: DC | PRN
  Administered 2021-01-08: 4 mg via INTRAVENOUS

## 2021-01-08 MED ORDER — SODIUM CHLORIDE 0.9 % IV SOLN
INTRAVENOUS | Status: DC | PRN
  Administered 2021-01-08: 500 mL

## 2021-01-08 MED ORDER — MIDAZOLAM HCL 2 MG/2ML IJ SOLN
INTRAMUSCULAR | Status: AC
  Filled 2021-01-08: qty 2

## 2021-01-08 MED ORDER — FENTANYL CITRATE (PF) 100 MCG/2ML IJ SOLN
25.0000 ug | INTRAMUSCULAR | Status: DC | PRN
  Administered 2021-01-08: 25 ug via INTRAVENOUS
  Administered 2021-01-08: 50 ug via INTRAVENOUS
  Administered 2021-01-08: 25 ug via INTRAVENOUS

## 2021-01-08 MED ORDER — OXYCODONE-ACETAMINOPHEN 5-325 MG PO TABS: 1.0000 | ORAL_TABLET | Freq: Four times a day (QID) | ORAL | 0 refills | Status: DC | PRN

## 2021-01-08 MED ORDER — CHLORHEXIDINE GLUCONATE 0.12 % MT SOLN: 15.0000 mL | Freq: Once | OROMUCOSAL | Status: AC

## 2021-01-08 MED ORDER — OXYCODONE-ACETAMINOPHEN 5-325 MG PO TABS
ORAL_TABLET | ORAL | Status: AC
  Filled 2021-01-08: qty 1

## 2021-01-08 MED ORDER — LIDOCAINE-EPINEPHRINE 0.5 %-1:200000 IJ SOLN
INTRAMUSCULAR | Status: AC
  Filled 2021-01-08: qty 1

## 2021-01-08 MED ORDER — LIDOCAINE-EPINEPHRINE (PF) 1 %-1:200000 IJ SOLN
INTRAMUSCULAR | Status: AC
  Filled 2021-01-08: qty 30

## 2021-01-08 MED ORDER — DEXAMETHASONE SODIUM PHOSPHATE 10 MG/ML IJ SOLN
INTRAMUSCULAR | Status: DC | PRN
  Administered 2021-01-08: 5 mg via INTRAVENOUS

## 2021-01-08 MED FILL — OXYCODONE-APAP 5-325MG: 5-325 | 5 days supply | Qty: 20 | Fill #0

## 2021-01-08 SURGICAL SUPPLY — 33 items
ADH SKN CLS APL DERMABOND .7 (GAUZE/BANDAGES/DRESSINGS) ×1
ARMBAND PINK RESTRICT EXTREMIT (MISCELLANEOUS) ×3 IMPLANT
BNDG ELASTIC 4X5.8 VLCR STR LF (GAUZE/BANDAGES/DRESSINGS) ×2 IMPLANT
BNDG GAUZE ELAST 4 BULKY (GAUZE/BANDAGES/DRESSINGS) ×2 IMPLANT
CANISTER SUCT 3000ML PPV (MISCELLANEOUS) ×3 IMPLANT
CANNULA VESSEL 3MM 2 BLNT TIP (CANNULA) ×3 IMPLANT
CLIP LIGATING EXTRA MED SLVR (CLIP) ×3 IMPLANT
CLIP LIGATING EXTRA SM BLUE (MISCELLANEOUS) ×3 IMPLANT
COVER PROBE W GEL 5X96 (DRAPES) ×3 IMPLANT
DECANTER SPIKE VIAL GLASS SM (MISCELLANEOUS) ×3 IMPLANT
DERMABOND ADVANCED (GAUZE/BANDAGES/DRESSINGS) ×2
DERMABOND ADVANCED .7 DNX12 (GAUZE/BANDAGES/DRESSINGS) ×1 IMPLANT
ELECT REM PT RETURN 9FT ADLT (ELECTROSURGICAL) ×3
ELECTRODE REM PT RTRN 9FT ADLT (ELECTROSURGICAL) ×1 IMPLANT
GLOVE SS BIOGEL STRL SZ 7.5 (GLOVE) ×1 IMPLANT
GLOVE SUPERSENSE BIOGEL SZ 7.5 (GLOVE) ×2
GOWN STRL REUS W/ TWL LRG LVL3 (GOWN DISPOSABLE) ×3 IMPLANT
GOWN STRL REUS W/TWL LRG LVL3 (GOWN DISPOSABLE) ×9
KIT BASIN OR (CUSTOM PROCEDURE TRAY) ×3 IMPLANT
KIT TURNOVER KIT B (KITS) ×3 IMPLANT
NS IRRIG 1000ML POUR BTL (IV SOLUTION) ×3 IMPLANT
PACK CV ACCESS (CUSTOM PROCEDURE TRAY) ×3 IMPLANT
PAD ARMBOARD 7.5X6 YLW CONV (MISCELLANEOUS) ×6 IMPLANT
SPONGE LAP 18X18 RF (DISPOSABLE) ×2 IMPLANT
SUT PROLENE 6 0 CC (SUTURE) ×5 IMPLANT
SUT SILK 2 0 SH (SUTURE) ×2 IMPLANT
SUT SILK 3 0 (SUTURE) ×3
SUT SILK 3-0 18XBRD TIE 12 (SUTURE) IMPLANT
SUT VIC AB 3-0 SH 27 (SUTURE) ×6
SUT VIC AB 3-0 SH 27X BRD (SUTURE) ×1 IMPLANT
TOWEL GREEN STERILE (TOWEL DISPOSABLE) ×3 IMPLANT
UNDERPAD 30X36 HEAVY ABSORB (UNDERPADS AND DIAPERS) ×3 IMPLANT
WATER STERILE IRR 1000ML POUR (IV SOLUTION) ×3 IMPLANT

## 2021-01-08 NOTE — Discharge Instructions (Signed)
° °  [facility] [place] ° °Discharge Instructions ° °AV Fistula or Graft Surgery for Dialysis Access ° °Please refer to the following instructions for your post-procedure care. Your surgeon or physician assistant will discuss any changes with you. ° °Activity ° °You may drive the day following your surgery, if you are comfortable and no longer taking prescription pain medication. Resume full activity as the soreness in your incision resolves. ° °Bathing/Showering ° °You may shower after you go home. Keep your incision dry for 48 hours. Do not soak in a bathtub, hot tub, or swim until the incision heals completely. You may not shower if you have a hemodialysis catheter. ° °Incision Care ° °Clean your incision with mild soap and water after 48 hours. Pat the area dry with a clean towel. You do not need a bandage unless otherwise instructed. Do not apply any ointments or creams to your incision. You may have skin glue on your incision. Do not peel it off. It will come off on its own in about one week. Your arm may swell a bit after surgery. To reduce swelling use pillows to elevate your arm so it is above your heart. Your doctor will tell you if you need to lightly wrap your arm with an ACE bandage. ° °Diet ° °Resume your normal diet. There are not special food restrictions following this procedure. In order to heal from your surgery, it is CRITICAL to get adequate nutrition. Your body requires vitamins, minerals, and protein. Vegetables are the best source of vitamins and minerals. Vegetables also provide the perfect balance of protein. Processed food has little nutritional value, so try to avoid this. ° °Medications ° °Resume taking all of your medications. If your incision is causing pain, you may take over-the counter pain relievers such as acetaminophen (Tylenol). If you were prescribed a stronger pain medication, please be aware these medications can cause nausea and constipation. Prevent  nausea by taking the medication with a snack or meal. Avoid constipation by drinking plenty of fluids and eating foods with high amount of fiber, such as fruits, vegetables, and grains. Do not take Tylenol if you are taking prescription pain medications. ° ° ° ° °Follow up °Your surgeon may want to see you in the office following your access surgery. If so, this will be arranged at the time of your surgery. ° °Please call us immediately for any of the following conditions: ° °Increased pain, redness, drainage (pus) from your incision site °Fever of 101 degrees or higher °Severe or worsening pain at your incision site °Hand pain or numbness. ° °Reduce your risk of vascular disease: ° °Stop smoking. If you would like help, call [facility] at 1-800-QUIT-NOW (1-800-784-8669) or [place] at [phone] ° °Manage your cholesterol °Maintain a desired weight °Control your diabetes °Keep your blood pressure down ° °Dialysis ° °It will take several weeks to several months for your new dialysis access to be ready for use. Your surgeon will determine when it is OK to use it. Your nephrologist will continue to direct your dialysis. You can continue to use your Permcath until your new access is ready for use. ° °If you have any questions, please call the office at [phone]. ° °

## 2021-01-08 NOTE — Anesthesia Postprocedure Evaluation (Signed)
Anesthesia Post Note  Patient: [name]  Procedure(s) Performed: RIGHT ARM SECOND STAGE BASCILIC VEIN TRANSPOSITION (Right Arm Upper)     Patient location during evaluation: PACU Anesthesia Type: General Level of consciousness: awake Pain management: pain level controlled Vital Signs Assessment: post-procedure vital signs reviewed and stable Respiratory status: spontaneous breathing Cardiovascular status: stable Postop Assessment: no apparent nausea or vomiting Anesthetic complications: no   No complications documented.  Last Vitals:  Vitals:   01/08/21 1354 01/08/21 1408  BP: (!) 137/92 (!) 143/98  Pulse:    Resp: 16 19  Temp:    SpO2:      Last Pain:  Vitals:   01/08/21 0940  PainSc: 0-No pain                 Makara Lanzo

## 2021-01-08 NOTE — Op Note (Signed)
    OPERATIVE REPORT  DATE OF SURGERY: 01/08/2021  PATIENT: [name], 40 y.o. female MRN: [id number]  DOB: 1981/07/25  PRE-OPERATIVE DIAGNOSIS: End-stage renal disease  POST-OPERATIVE DIAGNOSIS:  Same  PROCEDURE: Right second stage basilic vein transposition fistula  SURGEON:  Curt Jews, M.D.  PHYSICIAN ASSISTANT: Nurse  The assistant was needed for exposure and to expedite the case  ANESTHESIA: General  EBL: per anesthesia record  Total I/O In: 700 [I.V.:700] Out: 20 [Blood:20]  BLOOD ADMINISTERED: none  DRAINS: none  SPECIMEN: none  COUNTS CORRECT:  YES  PATIENT DISPOSITION:  PACU - hemodynamically stable  PROCEDURE DETAILS: Patient was taken operating placed supine position where the area of the right arm right axilla were prepped and draped in usual sterile fashion.  SonoSite ultrasound was used to mark the level of the basilic vein from the antecubital space to the axilla.  Incision was made over the prior brachial to basilic anastomosis and the basilic vein was exposed at this level.  The vein was very large.  Separate incision was made in the mid upper arm and then also at the level of the axilla.  Patient had multiple large tributary branches and these were ligated with 3-0 and 4-0 silk ties and divided.  The vein was occluded near the prior arteriovenous anastomosis and the vein was transected.  The vein was marked in its tunnel to reduce risk of twisting.  The vein was brought out through the axilla.  The vein was gently dilated and was of excellent size.  A subcutaneous tunnel was created from the antecubital space to the axilla and the vein was brought back through the tunnel.  The vein was then sewn into into itself with a running 6-0 Prolene suture.  Clamps removed and excellent thrill was noted.  The wounds were irrigated with saline.  Hemostasis was attained with electrocautery.  Wounds were closed with 3-0 Vicryl in the subcutaneous and subcuticular  tissue.  Sterile dressing was applied and the patient was transferred to the recovery room in stable condition   [name], M.D., [facility] 01/08/2021 3:20 PM

## 2021-01-08 NOTE — Anesthesia Procedure Notes (Signed)
Procedure Name: LMA Insertion Date/Time: 01/08/2021 11:21 AM Performed by: Thelma Comp, CRNA Pre-anesthesia Checklist: Patient identified, Emergency Drugs available, Suction available and Patient being monitored Patient Re-evaluated:Patient Re-evaluated prior to induction Oxygen Delivery Method: Circle System Utilized Preoxygenation: Pre-oxygenation with 100% oxygen Induction Type: IV induction Ventilation: Mask ventilation without difficulty LMA: LMA inserted LMA Size: 4.0 Number of attempts: 1 Placement Confirmation: positive ETCO2 Tube secured with: Tape Dental Injury: Teeth and Oropharynx as per pre-operative assessment

## 2021-01-08 NOTE — Transfer of Care (Signed)
Immediate Anesthesia Transfer of Care Note  Patient: [name]  Procedure(s) Performed: RIGHT ARM SECOND STAGE BASCILIC VEIN TRANSPOSITION (Right Arm Upper)  Patient Location: PACU  Anesthesia Type:General  Level of Consciousness: drowsy and patient cooperative  Airway & Oxygen Therapy: Patient Spontanous Breathing and Patient connected to face mask oxygen  Post-op Assessment: Report given to RN and Post -op Vital signs reviewed and stable  Post vital signs: Reviewed and stable  Last Vitals:  Vitals Value Taken Time  BP 137/92 01/08/21 1353  Temp    Pulse 86   Resp 16 01/08/21 1354  SpO2 100   Vitals shown include unvalidated device data.  Last Pain:  Vitals:   01/08/21 0940  PainSc: 0-No pain      Patients Stated Pain Goal: 5 (61/47/09 2957)  Complications: No complications documented.

## 2021-01-08 NOTE — Interval H&P Note (Signed)
History and Physical Interval Note:  01/08/2021 9:09 AM  [name]  has presented today for surgery, with the diagnosis of ESRD.  The various methods of treatment have been discussed with the patient and family. After consideration of risks, benefits and other options for treatment, the patient has consented to  Procedure(s): RIGHT ARM SECOND STAGE [facility] (Right) as a surgical intervention.  The patient's history has been reviewed, patient examined, no change in status, stable for surgery.  I have reviewed the patient's chart and labs.  Questions were answered to the patient's satisfaction.     Curt Jews

## 2021-01-09 ENCOUNTER — Encounter (HOSPITAL_COMMUNITY): Payer: Self-pay | Admitting: Vascular Surgery

## 2021-01-15 ENCOUNTER — Ambulatory Visit (INDEPENDENT_AMBULATORY_CARE_PROVIDER_SITE_OTHER): Payer: Self-pay | Admitting: Physician Assistant

## 2021-01-15 ENCOUNTER — Telehealth: Payer: Self-pay

## 2021-01-15 ENCOUNTER — Other Ambulatory Visit: Payer: Self-pay

## 2021-01-15 VITALS — BP 191/114 | HR 96 | Temp 97.7°F | Resp 20 | Ht 66.0 in | Wt 110.2 lb

## 2021-01-15 DIAGNOSIS — N186 End stage renal disease: Secondary | ICD-10-CM

## 2021-01-15 DIAGNOSIS — Z992 Dependence on renal dialysis: Secondary | ICD-10-CM

## 2021-01-15 MED ORDER — CEPHALEXIN 500 MG PO CAPS: 500.0000 mg | ORAL_CAPSULE | Freq: Every day | ORAL | 0 refills | Status: DC

## 2021-01-15 MED ORDER — OXYCODONE-ACETAMINOPHEN 5-325 MG PO TABS: 1.0000 | ORAL_TABLET | Freq: Four times a day (QID) | ORAL | 0 refills | Status: DC | PRN

## 2021-01-15 NOTE — Telephone Encounter (Signed)
Patient is s/p BVT on 01/08/21 w/TFE. Arm is very swollen and painful, slightly red and oozy around incisions. Denies fever and pus. Advised patient to elevate extremity, says she is and it is not helping much. She is taking Tylenol for pain and that is also not helping much. Put on PA schedule for evaluation.

## 2021-01-15 NOTE — Progress Notes (Signed)
POST OPERATIVE OFFICE NOTE    CC:  F/u for surgery  HPI:  This is a 40 y.o. 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 Donnetta Hutching.  She had a right 1st stage BVT on 08/01/2020 also by Dr. Donnetta Hutching.     She has a left TDC on T/T/S.  TDC was placed by Dr. Donzetta Matters in May 2021.  She did have a left basilic vein transposition, however, this was ligated due to bleeding on 07/28/2020 by Dr. Scot Dock.  She called earlier today with c/o pain and swelling in her arm and appt was made to be seen.  She states that her arm has been swollen since surgery.  At dialysis on Saturday, they were not too concerned but at dialysis yesterday, they told her she needed to be seen.  She states the swelling in her hand is worse.  She is not able to fully extend her arm.  She is not able to lift it above her head.  She states that she is elevating it up on pillows.  She states it is painful in the mid arm and in the axilla where her shirt is rubbing the incision.  She states they have been somewhat oozy.  She denies any purulence and states it is more clear like kool-aid. She has not had any fevers.  She does not have pain in her hand, but does have some tingling in her hand.  She is right handed.    She has had TDC on the right in the past.    The pt is on dialysis T/T/S at [facility] location.   Allergies  Allergen Reactions  . Tobramycin Sulfate Swelling    Eye swelling    Current Outpatient Medications  Medication Sig Dispense Refill  . calcitRIOL (ROCALTROL) 0.5 MCG capsule Take 4 capsules (2 mcg total) by mouth Every Tuesday,Thursday,and Saturday with dialysis.    Marland Kitchen calcium acetate (PHOSLO) 667 MG capsule Take 1,334-2,668 mg by mouth See admin instructions. Take 4 capsule (2668 mg) by mouth three times a day with each meal and 2 capsules (1334 mg) with each snack    . carvedilol (COREG) 12.5 MG tablet Take 12.5 mg by mouth 2 (two) times daily with a meal.     . diphenhydrAMINE (BENADRYL) 25 MG tablet  Take 25 mg by mouth at bedtime.     Marland Kitchen doxercalciferol (HECTOROL) 4 MCG/2ML injection Use as recommended by nephrology 2 mL   . heparin 1000 unit/mL SOLN injection 1,000 Units by Dialysis route one time in dialysis.     . hydrOXYzine (ATARAX/VISTARIL) 10 MG tablet Take 1 tablet (10 mg total) by mouth 3 (three) times daily as needed for anxiety. (Patient taking differently: Take 10 mg by mouth 3 (three) times daily as needed for itching or anxiety.) 5 tablet 0  . loperamide (IMODIUM A-D) 2 MG tablet Take 4 mg by mouth daily as needed for diarrhea or loose stools.     . Methoxy PEG-Epoetin Beta (MIRCERA IJ) Inject 1 Dose as directed every 14 (fourteen) days.    . multivitamin (RENA-VIT) TABS tablet Take 1 tablet by mouth daily.    . Nutritional Supplements (NOVASOURCE RENAL) LIQD Take 237 mLs by mouth See admin instructions. Take 1 container (237 mls) by at bedtime on dialysis days (Tuesday, Thursday, Saturday)    . oxyCODONE-acetaminophen (PERCOCET/ROXICET) 5-325 MG tablet Take 1 tablet by mouth every 6 (six) hours as needed. 20 tablet 0   No current facility-administered  medications for this visit.     ROS:  See HPI  Physical Exam:  Today's Vitals   01/15/21 1310  BP: (!) 191/114  Pulse: 96  Resp: 20  Temp: 97.7 F (36.5 C)  TempSrc: Temporal  SpO2: 96%  Weight: 110 lb 3.2 oz (50 kg)  Height: '5\' 6"'$  (1.676 m)  PainSc: 8    Body mass index is 17.79 kg/m.   Incisions:   Healing with mild drainage Extremities:   There is a palpable right radial pulse.   Motor and sensory are in tact.   There is a thrill/bruit present.  The fistula is easily palpable        Assessment/Plan:  This is a 40 y.o. female who is s/p: Right 2nd stage BVT on 01/08/2021 by Dr. Donnetta Hutching  -pt seen and examined with Dr. Oneida Alar -the pt does not have evidence of steal. -she does have some swelling throughout the right arm down to the hand.  There is some mild erythema mid arm, but she has not had any  fever or purulent drainage.  -will give Keflex '500mg'$  daily x 7 days.  She is also given a refill on Percocet 5/325 one q6h prn pain #20 (twenty) no refill.  -Also instructed pt to keep arm elevated as high as she can to help with swelling.  We will see her back in a couple of weeks and if she still has significant swelling, she would need venogram to evaluate for central venous stenosis.   -the pt will follow up on 01/29/2021 with PA.    Leontine Locket, [facility] [phone]  Clinic MD:  Pt seen and examined with Dr. Oneida Alar

## 2021-01-22 ENCOUNTER — Encounter (HOSPITAL_COMMUNITY): Payer: Self-pay | Admitting: Emergency Medicine

## 2021-01-22 ENCOUNTER — Observation Stay (HOSPITAL_COMMUNITY)
Admission: EM | Admit: 2021-01-22 | Discharge: 2021-01-23 | Disposition: A | Discharge status: Home / Self Care | Payer: Medicaid Other | Attending: Internal Medicine | Admitting: Internal Medicine

## 2021-01-22 ENCOUNTER — Emergency Department (HOSPITAL_BASED_OUTPATIENT_CLINIC_OR_DEPARTMENT_OTHER): Payer: Medicaid Other

## 2021-01-22 DIAGNOSIS — I5042 Chronic combined systolic (congestive) and diastolic (congestive) heart failure: Secondary | ICD-10-CM | POA: Diagnosis present

## 2021-01-22 DIAGNOSIS — Z79899 Other long term (current) drug therapy: Secondary | ICD-10-CM | POA: Diagnosis not present

## 2021-01-22 DIAGNOSIS — M79609 Pain in unspecified limb: Secondary | ICD-10-CM | POA: Diagnosis not present

## 2021-01-22 DIAGNOSIS — Z992 Dependence on renal dialysis: Secondary | ICD-10-CM

## 2021-01-22 DIAGNOSIS — F17210 Nicotine dependence, cigarettes, uncomplicated: Secondary | ICD-10-CM | POA: Insufficient documentation

## 2021-01-22 DIAGNOSIS — T82848A Pain from vascular prosthetic devices, implants and grafts, initial encounter: Principal | ICD-10-CM | POA: Insufficient documentation

## 2021-01-22 DIAGNOSIS — N186 End stage renal disease: Secondary | ICD-10-CM

## 2021-01-22 DIAGNOSIS — T7840XA Allergy, unspecified, initial encounter: Secondary | ICD-10-CM

## 2021-01-22 DIAGNOSIS — U071 COVID-19: Secondary | ICD-10-CM | POA: Diagnosis not present

## 2021-01-22 DIAGNOSIS — D631 Anemia in chronic kidney disease: Secondary | ICD-10-CM | POA: Diagnosis not present

## 2021-01-22 DIAGNOSIS — M7989 Other specified soft tissue disorders: Secondary | ICD-10-CM

## 2021-01-22 DIAGNOSIS — I132 Hypertensive heart and chronic kidney disease with heart failure and with stage 5 chronic kidney disease, or end stage renal disease: Secondary | ICD-10-CM | POA: Insufficient documentation

## 2021-01-22 DIAGNOSIS — T829XXA Unspecified complication of cardiac and vascular prosthetic device, implant and graft, initial encounter: Secondary | ICD-10-CM

## 2021-01-22 DIAGNOSIS — I5043 Acute on chronic combined systolic (congestive) and diastolic (congestive) heart failure: Secondary | ICD-10-CM | POA: Diagnosis present

## 2021-01-22 LAB — BASIC METABOLIC PANEL
Anion gap: 15 (ref 5–15)
BUN: 56 mg/dL — ABNORMAL HIGH (ref 6–20)
CO2: 27 mmol/L (ref 22–32)
Calcium: 8.9 mg/dL (ref 8.9–10.3)
Chloride: 96 mmol/L — ABNORMAL LOW (ref 98–111)
Creatinine, Ser: 8.7 mg/dL — ABNORMAL HIGH (ref 0.44–1.00)
GFR, Estimated: 6 mL/min — ABNORMAL LOW (ref >60)
Glucose, Bld: 120 mg/dL — ABNORMAL HIGH (ref 70–99)
Potassium: 4.5 mmol/L (ref 3.5–5.1)
Sodium: 138 mmol/L (ref 135–145)

## 2021-01-22 LAB — CBC
HCT: 30.1 % — ABNORMAL LOW (ref 36.0–46.0)
Hemoglobin: 9.3 g/dL — ABNORMAL LOW (ref 12.0–15.0)
MCH: 28.4 pg (ref 26.0–34.0)
MCHC: 30.9 g/dL (ref 30.0–36.0)
MCV: 91.8 fL (ref 80.0–100.0)
Platelets: 270 10*3/uL (ref 150–400)
RBC: 3.28 MIL/uL — ABNORMAL LOW (ref 3.87–5.11)
RDW: 17.7 % — ABNORMAL HIGH (ref 11.5–15.5)
WBC: 6.4 10*3/uL (ref 4.0–10.5)
nRBC: 0.9 % — ABNORMAL HIGH (ref 0.0–0.2)

## 2021-01-22 LAB — SARS CORONAVIRUS 2 BY RT PCR (HOSPITAL ORDER, PERFORMED IN ~~LOC~~ HOSPITAL LAB): SARS Coronavirus 2: POSITIVE — AB

## 2021-01-22 MED ORDER — DIPHENHYDRAMINE HCL 25 MG PO CAPS
25.0000 mg | ORAL_CAPSULE | Freq: Three times a day (TID) | ORAL | Status: DC | PRN
  Administered 2021-01-23: 25 mg via ORAL
  Filled 2021-01-22: qty 1

## 2021-01-22 MED ORDER — MORPHINE SULFATE (PF) 4 MG/ML IV SOLN
4.0000 mg | Freq: Once | INTRAVENOUS | Status: AC
  Administered 2021-01-22: 4 mg via INTRAVENOUS
  Filled 2021-01-22: qty 1

## 2021-01-22 MED ORDER — NEPRO/CARBSTEADY PO LIQD
237.0000 mL | ORAL | Status: DC
  Filled 2021-01-22: qty 237

## 2021-01-22 MED ORDER — ACETAMINOPHEN 325 MG PO TABS: 650.0000 mg | ORAL_TABLET | Freq: Four times a day (QID) | ORAL | Status: DC | PRN

## 2021-01-22 MED ORDER — LOPERAMIDE HCL 2 MG PO CAPS: 2.0000 mg | ORAL_CAPSULE | Freq: Every day | ORAL | Status: DC | PRN

## 2021-01-22 MED ORDER — ACETAMINOPHEN 650 MG RE SUPP: 650.0000 mg | Freq: Four times a day (QID) | RECTAL | Status: DC | PRN

## 2021-01-22 MED ORDER — HEPARIN SODIUM (PORCINE) 5000 UNIT/ML IJ SOLN: 5000.0000 [IU] | Freq: Three times a day (TID) | INTRAMUSCULAR | Status: DC

## 2021-01-22 MED ORDER — RENA-VITE PO TABS
1.0000 | ORAL_TABLET | Freq: Every day | ORAL | Status: DC
  Filled 2021-01-22 (×2): qty 1

## 2021-01-22 MED ORDER — MORPHINE SULFATE (PF) 4 MG/ML IV SOLN
4.0000 mg | Freq: Once | INTRAVENOUS | Status: AC
Start: 2021-01-22 — End: 2021-01-22
  Administered 2021-01-22: 4 mg via INTRAVENOUS
  Filled 2021-01-22: qty 1

## 2021-01-22 MED ORDER — HYDROCODONE-ACETAMINOPHEN 7.5-325 MG PO TABS
1.0000 | ORAL_TABLET | Freq: Four times a day (QID) | ORAL | Status: DC | PRN
  Administered 2021-01-22: 1 via ORAL
  Filled 2021-01-22 (×2): qty 1

## 2021-01-22 MED ORDER — DIPHENHYDRAMINE HCL 50 MG/ML IJ SOLN
25.0000 mg | Freq: Once | INTRAMUSCULAR | Status: AC
  Administered 2021-01-22: 25 mg via INTRAVENOUS
  Filled 2021-01-22: qty 1

## 2021-01-22 MED ORDER — DIPHENHYDRAMINE HCL 25 MG PO CAPS
25.0000 mg | ORAL_CAPSULE | Freq: Once | ORAL | Status: AC
  Administered 2021-01-22: 25 mg via ORAL
  Filled 2021-01-22: qty 1

## 2021-01-22 MED ORDER — SODIUM CHLORIDE 0.9% FLUSH: 3.0000 mL | Freq: Two times a day (BID) | INTRAVENOUS | Status: DC

## 2021-01-22 MED ORDER — METHYLPREDNISOLONE SODIUM SUCC 125 MG IJ SOLR
125.0000 mg | Freq: Once | INTRAMUSCULAR | Status: AC
  Administered 2021-01-22: 125 mg via INTRAVENOUS
  Filled 2021-01-22: qty 2

## 2021-01-22 MED ORDER — CALCIUM ACETATE (PHOS BINDER) 667 MG PO CAPS
2668.0000 mg | ORAL_CAPSULE | Freq: Three times a day (TID) | ORAL | Status: DC
  Administered 2021-01-23: 2668 mg via ORAL
  Filled 2021-01-22: qty 4

## 2021-01-22 MED ORDER — CARVEDILOL 3.125 MG PO TABS
12.5000 mg | ORAL_TABLET | Freq: Two times a day (BID) | ORAL | Status: DC
  Filled 2021-01-22: qty 1

## 2021-01-22 MED ORDER — FAMOTIDINE IN NACL 20-0.9 MG/50ML-% IV SOLN
20.0000 mg | Freq: Once | INTRAVENOUS | Status: AC
  Administered 2021-01-22: 20 mg via INTRAVENOUS
  Filled 2021-01-22: qty 50

## 2021-01-22 MED ORDER — CINACALCET HCL 30 MG PO TABS
30.0000 mg | ORAL_TABLET | Freq: Every day | ORAL | Status: DC
Start: 2021-01-23 — End: 2021-01-23
  Administered 2021-01-23: 30 mg via ORAL
  Filled 2021-01-22: qty 1

## 2021-01-22 NOTE — ED Provider Notes (Signed)
Care assumed from [facility], please see her note for full details, but in brief, patient presents with swelling and edema of the right arm with some drainage from recent surgery on right fistula.  Had second stage of AV fistula surgery on 1/12 with Dr. Donnetta Hutching.  Had some erythema and swelling and was started on Keflex in the office but only took 2 doses.  Was given IV vancomycin and cefazolin and then developed diffuse itching and some swelling and blistering to her lips.  These medications were discontinued but she continues to have some itching and discomfort.  Dr. Trula Slade with vascular surgery was consulted and evaluated patient in the ED, recommended ultrasound of the right arm to evaluate venous flow and for potential abscess, patient may need fistulogram.  Basic labs obtained as well, fortunately no leukocytosis, stable hemoglobin, chronic renal failure noted, potassium of 4.5.  Due for dialysis tomorrow.  Plan: Call vascular once [name] has been read to determine dispo  Physical Exam  BP 111/70 (BP Location: Right Arm)   Pulse 84   Temp 98.5 F (36.9 C) (Oral)   Resp 16   SpO2 97%   Physical Exam Vitals and nursing note reviewed.  Constitutional:      General: She is not in acute distress.    Appearance: Normal appearance. She is well-developed and well-nourished. She is not diaphoretic.     Comments: Chronically ill-appearing, but alert and in no acute distress.  HENT:     Head: Normocephalic and atraumatic.  Eyes:     General:        Right eye: No discharge.        Left eye: No discharge.  Pulmonary:     Effort: Pulmonary effort is normal. No respiratory distress.  Musculoskeletal:     Comments: Surgical wounds present in the right upper arm from recent fistula surgery surrounding erythema and significant edema of the entire right arm and hand.  Skin:    General: Skin is warm and dry.  Neurological:     Mental Status: She is alert and oriented to person, place, and  time.     Coordination: Coordination normal.  Psychiatric:        Mood and Affect: Mood and affect and mood normal.        Behavior: Behavior normal.     ED Course/Procedures   Labs Reviewed  CBC - Abnormal; Notable for the following components:      Result Value   RBC 3.28 (*)    Hemoglobin 9.3 (*)    HCT 30.1 (*)    RDW 17.7 (*)    nRBC 0.9 (*)    All other components within normal limits  BASIC METABOLIC PANEL - Abnormal; Notable for the following components:   Chloride 96 (*)    Glucose, Bld 120 (*)    BUN 56 (*)    Creatinine, Ser 8.70 (*)    GFR, Estimated 6 (*)    All other components within normal limits  SARS CORONAVIRUS 2 BY RT PCR (HOSPITAL ORDER, [facility])  RENAL FUNCTION PANEL  CBC    VAS US DUPLEX DIALYSIS ACCESS (AVF, AVG)  Result Date: 01/22/2021 DIALYSIS ACCESS Reason for Exam: Swelling and pain (New AVF). Access Site: Right Upper Extremity. Access Type: Brachial-cephalic AVF. Performing Technologist: Vonzell Schlatter RVT  Examination Guidelines: A complete evaluation includes B-mode imaging, spectral Doppler, color Doppler, and power Doppler as needed of all accessible portions of each vessel. Unilateral  testing is considered an integral part of a complete examination. Limited examinations for reoccurring indications may be performed as noted.  Findings: +---------------+----------+-----------------+--------+ AVF            PSV (cm/s)Flow Vol (mL/min)Comments +---------------+----------+-----------------+--------+ AVF Anastomosis                1792                +---------------+----------+-----------------+--------+    Summary: AVF appears patent with anastomosis measuring .38cm and proximal .57cm. Volume flow is 1792 cc/min. No evidence of a stenosis or occlusion was visualized. Fluid is documented throughout upper arm. Arm is very edematous however no intrusion of AVF is seen. Small branch noted upper arm that leaves ceph and  reenters almost immediately. Outflow is patent.  *See table(s) above for measurements and observations.   --------------------------------------------------------------------------------   Preliminary      Procedures  MDM   Vascular ultrasound of the fistula completed, will repage Dr. Trula Slade to discuss.  Case discussed with Dr. Trula Slade, after reviewing ultrasound he feels the patient will need a fistulogram and he plans to do this tomorrow morning, feels it would be appropriate to admit patient to medicine observe overnight.  She does not seem to have any worsening of the symptoms from her allergic reaction but has to continue to have some intermittent itching.  Patient is due for dialysis tomorrow.  Will consult for medicine admission.  Patient will have fistulogram at 6 AM tomorrow, preprocedure Covid test ordered.  Case discussed with Dr. Gwenette Greet with [facility] who will see and admit the patient.      Janet Berlin 01/22/21 2031    Breck Coons, MD 01/22/21 2252

## 2021-01-22 NOTE — ED Provider Notes (Signed)
I provided a substantive portion of the care of this patient.  I personally performed the entirety of the medical decision making for this encounter.    40 year old female presents with swelling to her right arm and proximal allergic reaction.  On exam she has evidence of cellulitis of that arm.  Will consult vascular surgery   Lacretia Leigh, MD 01/22/21 1309

## 2021-01-22 NOTE — ED Triage Notes (Addendum)
Patient complains of swelling to the right hand and her lips after starting a new antibiotic for an infection in her fistula. No dyspnea. Patient alert, oriented, and ambulatory.

## 2021-01-22 NOTE — ED Provider Notes (Signed)
[facility] EMERGENCY DEPARTMENT Provider Note   CSN: [id number] Arrival date & time: 01/22/21  1030     History Chief Complaint  Patient presents with  . Allergic Reaction    [name] is a 40 y.o. female with history of SLE who presents for concern of swelling, edema, pain of the right arm and, purulent drainage from recent fistula surgical sites of right arm.   Patient had second stage DVT of the right arm on 01/08/2019 by Dr. Donnetta Hutching (vascular surgeon).  She was started on Keflex on 1/19 in the office secondary to pain and swelling of the right arm.  Patient states she only took 2 doses outpatient.  Yesterday (1/25), patient was administered IV antibiotics at her dialysis center.  She states that she woke up this morning and her lips were blistered and swelling and she had an itchy sensation in the back of her throat.  She states that additionally she was draining more pus from her arm so she presented to the emergency department.  She has no recollection of what antibiotic she was administered yesterday.  She denies any chest pain, palpitations, nausea, vomiting, diarrhea at this time.  She does endorse chills but denies known fevers at home.  She has not been taken anything at home for her symptoms.  Patient was evaluated by provider in triage, administered Benadryl.  I personally reviewed this patient's medical records.  She has history of SLE, lupus nephritis, polysubstance abuse, ESRD on hemodialysis, SVT, hypertension.  HPI     Past Medical History:  Diagnosis Date  . Anemia    low iron - receives iron at dialysis  . Anxiety   . Arthritis    RA  . Chronic systolic congestive heart failure ([place]) 03/16/2016  . Dyspnea   . ESRD (end stage renal disease) ([place])    [place] TTHSAT _ [place]  . H/O pericarditis 01/17/2013  . H/O pleural effusion 01/17/2013  . Heart murmur   . Lupus (systemic lupus erythematosus) (HCC)    Previously followed with Dr.  Charlestine Night, has not followed up recently  . 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 Pneumonia    many times  . Polysubstance abuse ([place])    cocaine, MJ, tobacco  . S/P pericardiocentesis 01/17/2013   H/o pericardial effusion with tamponade 2006   . Seizures ([place])    during pregnancy 1 time  . Streptococcal bacteremia 01/23/2013   She had two S. pneumonae bacteremia on 01/21/2013. Sensitive to Peniccilin     Patient Active Problem List   Diagnosis Date Noted  . Acute respiratory failure with hypoxia ([place]) 11/02/2020  . Acute hyperkalemia 07/28/2020  . Nicotine dependence, cigarettes, uncomplicated [id number]  . Essential hypertension 07/28/2020  . Polysubstance abuse ([place]) 07/28/2020  . Hemodialysis catheter malfunction ([place]) 07/28/2020  . Complication of AV dialysis fistula, sequela 07/28/2020  . 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[place]) 05/18/2020  . Bacterial intestinal infection, unspecified 12/14/2019  . Pain in arm, unspecified 08/15/2019  . Cellulitis, unspecified 05/02/2019  . Hyperkalemia 07/29/2018  . SVT (supraventricular tachycardia) ([place]) 07/16/2018  . ESRD (end stage renal disease) on dialysis ([place]) 02/17/2017  . Cardiomyopathy, unspecified ([place]) 12/03/2016  . Diarrhea, unspecified 04/18/2016  . Chronic combined systolic (congestive) and diastolic (congestive) heart failure ([place]) 03/16/2016  . Other hypervolemia   . S/P thoracentesis   . Cough with hemoptysis   .  Myalgia   . Pulmonary edema   . Dependence on renal dialysis ([place]) 10/15/2015  . Rash and nonspecific skin eruption 06/26/2015  . Secondary Raynaud's phenomenon 06/24/2015  . 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 Fever, unspecified 08/28/2014  . Pruritus, unspecified 08/28/2014  . Headache, unspecified 04/30/2014  . Pain, unspecified 04/30/2014  . Cramping of hands  04/19/2014  . Unspecified contraceptive management 04/19/2014  . End stage renal disease ([place]) 03/27/2014  . Mild protein-calorie malnutrition ([place]) 03/22/2014  . Insomnia 03/15/2014  . Benign hypertension with ESRD (end-stage renal disease) ([place]) 03/15/2014  . Tobacco abuse 02/15/2014  . Healthcare maintenance 02/15/2014  . Iron deficiency anemia, unspecified 02/12/2014  . Coagulation defect, unspecified ([place]) 02/10/2014  . Anemia in chronic kidney disease 02/09/2014  . Secondary hyperparathyroidism of renal origin ([name]) 02/09/2014  . Hypoalbuminemia 02/01/2014  . Nephrotic syndrome 02/01/2014  . ESRD on dialysis ([place]) 01/31/2014  . Pleural effusion, left 01/31/2014  . Microcytic anemia 01/29/2014  . Hypocalcemia 01/29/2014  . Cocaine abuse ([place]) 01/18/2013  . Marijuana smoker ([place]) 01/18/2013  . H/O pericarditis 01/17/2013  . SLE (systemic lupus erythematosus) ([place]) 01/17/2013  . Lupus nephritis ([place]) 01/17/2013  . S/P pericardiocentesis 01/17/2013  . H/O pleural effusion 01/17/2013  . Nephrosis 01/17/2013  . Preseptal cellulitis 01/17/2013  . Cardiac tamponade 12/29/2004  . Pericardial effusion (noninflammatory) 12/29/2004    Past Surgical History:  Procedure Laterality Date  . AV FISTULA PLACEMENT    . AV FISTULA PLACEMENT Right 08/01/2020   Procedure: RIGHT ARM BRACHIOCEPHALIC  ARTERIOVENOUS (AV) FISTULA CREATION;  Surgeon: [name], MD;  Location: [facility];  Service: Vascular;  Laterality: Right;  . BASCILIC VEIN TRANSPOSITION Left 02/05/2014   Procedure: [place];  Surgeon: [name], MD;  Location: [facility];  Service: Vascular;  Laterality: Left;  . BASCILIC VEIN TRANSPOSITION Right 01/08/2021   Procedure: RIGHT ARM SECOND STAGE BASCILIC VEIN TRANSPOSITION;  Surgeon: [name], MD;  Location: [facility];  Service: Vascular;  Laterality: Right;  . FISTULA SUPERFICIALIZATION Left 05/30/2018   Procedure: FISTULA PLICATION BASILIC VEIN TRANSPOSITION;   Surgeon: Angelia Mould, MD;  Location: [facility];  Service: Vascular;  Laterality: Left;  . FISTULA SUPERFICIALIZATION Left 123XX123   Procedure: PLICATION OF LEFT ARTERIOVENOUS FISTULA ULCER;  Surgeon: [name], MD;  Location: [facility];  Service: Vascular;  Laterality: Left;  . INSERTION OF DIALYSIS CATHETER N/A 05/19/2020   Procedure: TUNNELED INSERTION  OF DIALYSIS CATHETER;  Surgeon: Waynetta Sandy, MD;  Location: [facility];  Service: Vascular;  Laterality: N/A;  . THROMBECTOMY AND REVISION OF ARTERIOVENTOUS (AV) GORETEX  GRAFT Left 07/28/2020   Procedure: Oversewing of left arm Brachial cephalic fistula for bleeding.;  Surgeon: Angelia Mould, MD;  Location: [facility];  Service: Vascular;  Laterality: Left;  Marland Kitchen VENOGRAM Right 01/31/2014   Procedure: DIALYSIS CATHETER;  Surgeon: Serafina Mitchell, MD;  Location: [facility] CATH LAB;  Service: Cardiovascular;  Laterality: Right;     OB History   No obstetric history on file.     No family history on file.  Social History   Tobacco Use  . Smoking status: Current Every Day Smoker    Packs/day: 0.12    Years: 15.00    Pack years: 1.80    Types: Cigarettes  . Smokeless tobacco: Never Used  . Tobacco comment: 4 cigarettes per day  Vaping Use  . Vaping Use: Never used  Substance Use Topics  . Alcohol use: Yes  Alcohol/week: 0.0 standard drinks    Comment: Special Occasional takes Vicar  . Drug use: Yes    Types: Marijuana    Comment: ocassional, last time- 12/21/20    Home Medications Prior to Admission medications   Medication Sig Start Date End Date Taking? Authorizing Provider  calcitRIOL (ROCALTROL) 0.5 MCG capsule Take 4 capsules (2 mcg total) by mouth Every Tuesday,Thursday,and Saturday with dialysis. 11/07/20   Hosie Poisson, MD  calcium acetate (PHOSLO) 667 MG capsule Take 1,334-2,668 mg by mouth See admin instructions. Take 4 capsule (2668 mg) by mouth three times a day with each meal and 2 capsules (1334 mg)  with each snack    [provider]  carvedilol (COREG) 12.5 MG tablet Take 12.5 mg by mouth 2 (two) times daily with a meal.  11/07/19   [provider]  cephALEXin (KEFLEX) 500 MG capsule Take 1 capsule (500 mg total) by mouth daily. 01/15/21   Rhyne, Hulen Shouts, PA-C  diphenhydrAMINE (BENADRYL) 25 MG tablet Take 25 mg by mouth at bedtime.     [provider]  doxercalciferol (HECTOROL) 4 MCG/2ML injection Use as recommended by nephrology 11/05/20   Hosie Poisson, MD  heparin 1000 unit/mL SOLN injection 1,000 Units by Dialysis route one time in dialysis.  05/21/20 06/05/21  [provider]  hydrOXYzine (ATARAX/VISTARIL) 10 MG tablet Take 1 tablet (10 mg total) by mouth 3 (three) times daily as needed for anxiety. Patient taking differently: Take 10 mg by mouth 3 (three) times daily as needed for itching or anxiety. 07/29/19   Caccavale, Sophia, PA-C  loperamide (IMODIUM A-D) 2 MG tablet Take 4 mg by mouth daily as needed for diarrhea or loose stools.     [provider]  Methoxy PEG-Epoetin Beta (MIRCERA IJ) Inject 1 Dose as directed every 14 (fourteen) days. 03/16/20 03/15/21  [provider]  multivitamin (RENA-VIT) TABS tablet Take 1 tablet by mouth daily.    [provider]  Nutritional Supplements (NOVASOURCE RENAL) LIQD Take 237 mLs by mouth See admin instructions. Take 1 container (237 mls) by at bedtime on dialysis days (Tuesday, Thursday, Saturday)    [provider]  oxyCODONE-acetaminophen (PERCOCET/ROXICET) 5-325 MG tablet Take 1 tablet by mouth every 6 (six) hours as needed. 01/15/21   Rhyne, Hulen Shouts, PA-C    Allergies    Tobramycin sulfate  Review of Systems   Review of Systems  Constitutional: Positive for chills. Negative for fatigue and fever.  HENT: Positive for facial swelling.        Swelling and blisters on the lips  Respiratory: Negative.   Cardiovascular: Negative.   Gastrointestinal: Negative.    Musculoskeletal:       Swelling, warmth, of the right arm, purulent, bloody drainage from incision site of new fistula  Allergic/Immunologic: Positive for immunocompromised state.       Lupus    Physical Exam Updated Vital Signs BP 111/70 (BP Location: Right Arm)   Pulse 84   Temp 98.5 F (36.9 C) (Oral)   Resp 16   SpO2 97%   Physical Exam Vitals and nursing note reviewed.  HENT:     Head: Normocephalic and atraumatic.     Nose: Nose normal.     Mouth/Throat:     Mouth: Mucous membranes are moist.     Tongue: No lesions.     Pharynx: Oropharynx is clear. Uvula midline. No oropharyngeal exudate, posterior oropharyngeal erythema or uvula swelling.     Tonsils: No tonsillar exudate or tonsillar  abscesses.      Comments: No concern for airway compromise at this time.  Eyes:     General:        Right eye: No discharge.        Left eye: No discharge.     Extraocular Movements: Extraocular movements intact.     Conjunctiva/sclera: Conjunctivae normal.     Pupils: Pupils are equal, round, and reactive to light.  Neck:     Trachea: Trachea and phonation normal.  Cardiovascular:     Rate and Rhythm: Normal rate and regular rhythm.     Pulses:          Radial pulses are 1+ on the right side and 2+ on the left side.       Dorsalis pedis pulses are 2+ on the right side and 2+ on the left side.     Heart sounds: Normal heart sounds. No murmur heard.   Pulmonary:     Effort: Pulmonary effort is normal. No respiratory distress.     Breath sounds: Normal breath sounds. No wheezing or rales.  Chest:     Chest wall: No deformity, swelling, tenderness, crepitus or edema.    Abdominal:     General: Bowel sounds are normal. There is no distension.     Palpations: Abdomen is soft.     Tenderness: There is no abdominal tenderness. There is no guarding or rebound.  Musculoskeletal:        General: No deformity.     Right shoulder: Swelling present. No crepitus.     Left shoulder:  Normal.     Right upper arm: Swelling present.     Left upper arm: Normal.     Right elbow: Swelling present.     Left elbow: Normal.     Right forearm: Swelling present.     Left forearm: Normal.     Right wrist: Swelling present.     Left wrist: Normal.     Right hand: Swelling present.     Left hand: Normal.       Arms:     Cervical back: Neck supple. No rigidity or crepitus. No pain with movement or spinous process tenderness.     Right lower leg: No edema.     Left lower leg: No edema.     Comments: Normal cap refill in all 5 digits of the right hand. Decreased ROM of wrist and digits of the right hand secondary to edema.  Patient unable to perform grip strength assessment due to inability to flex digits sufficiently for this exam.  Lymphadenopathy:     Cervical: No cervical adenopathy.  Skin:    General: Skin is warm and dry.     Capillary Refill: Capillary refill takes less than 2 seconds.     Findings: No rash.     Comments: No other blistering or rash to suggest SJS or TEN.   Neurological:     General: No focal deficit present.     Mental Status: She is alert and oriented to person, place, and time. Mental status is at baseline.     Sensory: Sensation is intact.     Motor: Motor function is intact.  Psychiatric:        Mood and Affect: Mood normal.     ED Results / Procedures / Treatments   Labs (all labs ordered are listed, but only abnormal results are displayed) Labs Reviewed  CBC - Abnormal; Notable for the following components:  Result Value   RBC 3.28 (*)    Hemoglobin 9.3 (*)    HCT 30.1 (*)    RDW 17.7 (*)    nRBC 0.9 (*)    All other components within normal limits  BASIC METABOLIC PANEL - Abnormal; Notable for the following components:   Chloride 96 (*)    Glucose, Bld 120 (*)    BUN 56 (*)    Creatinine, Ser 8.70 (*)    GFR, Estimated 6 (*)    All other components within normal limits    EKG None  Radiology VAS US DUPLEX DIALYSIS  ACCESS (AVF, AVG)  Result Date: 01/22/2021 DIALYSIS ACCESS Reason for Exam: Swelling and pain (New AVF). Access Site: Right Upper Extremity. Access Type: Brachial-cephalic AVF. Performing Technologist: Vonzell Schlatter RVT  Examination Guidelines: A complete evaluation includes B-mode imaging, spectral Doppler, color Doppler, and power Doppler as needed of all accessible portions of each vessel. Unilateral testing is considered an integral part of a complete examination. Limited examinations for reoccurring indications may be performed as noted.  Findings: +---------------+----------+-----------------+--------+ AVF            PSV (cm/s)Flow Vol (mL/min)Comments +---------------+----------+-----------------+--------+ AVF Anastomosis                1792                +---------------+----------+-----------------+--------+    Summary: AVF appears patent with anastomosis measuring .38cm and proximal .57cm. Volume flow is 1792 cc/min. No evidence of a stenosis or occlusion was visualized. Fluid is documented throughout upper arm. Arm is very edematous however no intrusion of AVF is seen. Small branch noted upper arm that leaves ceph and reenters almost immediately. Outflow is patent.  *See table(s) above for measurements and observations.   --------------------------------------------------------------------------------   Preliminary     Procedures Procedures   Medications Ordered in ED Medications  diphenhydrAMINE (BENADRYL) injection 25 mg (has no administration in time range)  morphine 4 MG/ML injection 4 mg (has no administration in time range)  diphenhydrAMINE (BENADRYL) capsule 25 mg (25 mg Oral Given 01/22/21 1124)  morphine 4 MG/ML injection 4 mg (4 mg Intravenous Given 01/22/21 1358)  methylPREDNISolone sodium succinate (SOLU-MEDROL) 125 mg/2 mL injection 125 mg (125 mg Intravenous Given 01/22/21 1358)  famotidine (PEPCID) IVPB 20 mg premix (20 mg Intravenous New Bag/Given 01/22/21 1359)     ED Course  I have reviewed the triage vital signs and the nursing notes.  Pertinent labs & imaging results that were available during my care of the patient were reviewed by me and considered in my medical decision making (see chart for details).  Clinical Course as of 01/22/21 1621  Wed Jan 22, 2021  1256 I spoke with [facility] nurse, Hilliard Clark, who was able to access patient's EMR.  Patient received 2 g of cefazolin, 1 g of vancomycin yesterday through her IV at her dialysis center for fistula infection. [RS]  1346 Dr. Trula Slade, Vascular surgery, evaluated this patient in the ED. He recommends US of the right arm to evaluate venous flow and for possible abscess; possible fistulogram tomorrow. Disposition pending [name] results. I appreciate his collaboration in the care of this patient.  [RS]    Clinical Course User Index [RS] Lavern Maslow, Sharlene Dory   MDM Rules/Calculators/A&P                         40 year old female with history of end-stage renal disease secondary  to lupus nephritis who presents with concern for swelling, pain, redness to her right arm, as well as purulent drainage from recent fistula surgical sites on the same arm.  Additionally has swelling and blistering of her lips after administration of IV antibiotics yesterday at the dialysis center. Please see ED course above for further information.  Patient's vital signs are normal on intake.  Basic laboratory states obtained in triage.  CBC without leukocytosis, BMP without significant electrolyte derangement.  Creatinine at patient's baseline.  At the time my physical exam concern for significant edema, erythema of the right arm, and purulent drainage from patient's recent surgical sites.  Additionally patient has edema and blistering of the lips bilaterally.  Cardiopulmonary exam is without abnormality.  Analgesia offered.  Consult placed to vascular surgery, Dr. Trula Slade, who was agreeable to seeing  this patient in the emergency department.  Please see ED course above.  Care of patient was signed out to oncoming ED provider, Benedetto Goad, PA-C at time of shift change.  All pertinent HPI, physical exam, laboratory imaging studies were reviewed with her prior to my departure.  I appreciate her collaboration in the care of this patient.  Patient will likely need admission to the hospital for management of extensive cellulitis of the right arm, however to be disposition to be determined following discussion of [name] results with vascular surgery.   This chart was dictated using voice recognition software, Dragon. Despite the best efforts of this provider to proofread and correct errors, errors may still occur which can change documentation meaning.   Final Clinical Impression(s) / ED Diagnoses Final diagnoses:  None    Rx / DC Orders ED Discharge Orders    None       Aura Dials 01/22/21 1621    Lacretia Leigh, MD 01/24/21 [phone]

## 2021-01-22 NOTE — Consult Note (Signed)
[facility] [place]  Patient name: [name] MRN: [id number] DOB: 05/02/1981 Sex: female   REQUESTING PROVIDER:   ER   REASON FOR CONSULT:    Dialysis access complication  HISTORY OF PRESENT ILLNESS:   [name] is a 40 y.o. female, who is status post second stage basilic vein fistula on [id number] by Dr. Donnetta Hutching.  She has been having pain and swelling in her arm since surgery.  She had a reaction to her antibiotics at dialysis and was sent to the hospital.  There was concern over infection in the arm  PAST MEDICAL HISTORY    Past Medical History:  Diagnosis Date  . Anemia    low iron - receives iron at dialysis  . Anxiety   . Arthritis    RA  . Chronic systolic congestive heart failure ([place]) 03/16/2016  . Dyspnea   . ESRD (end stage renal disease) ([place])    [facility] TTHSAT _ [place]  . H/O pericarditis 01/17/2013  . H/O pleural effusion 01/17/2013  . Heart murmur   . Lupus (systemic lupus erythematosus) (HCC)    Previously followed with Dr. Charlestine Night, has not followed up recently  . 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 Pneumonia    many times  . Polysubstance abuse ([place])    cocaine, MJ, tobacco  . S/P pericardiocentesis 01/17/2013   H/o pericardial effusion with tamponade 2006   . Seizures ([place])    during pregnancy 1 time  . Streptococcal bacteremia 01/23/2013   She had two S. pneumonae bacteremia on 01/21/2013. Sensitive to Peniccilin      FAMILY HISTORY   No family history on file.  SOCIAL HISTORY:   Social History   Socioeconomic History  . Marital status: Single    Spouse name: Not on file  . Number of children: Not on file  . Years of education: Not on file  . Highest education level: Not on file  Occupational History  . Not on file   Tobacco Use  . Smoking status: Current Every Day Smoker    Packs/day: 0.12    Years: 15.00    Pack years: 1.80    Types: Cigarettes  . Smokeless tobacco: Never Used  . Tobacco comment: 4 cigarettes per day  Vaping Use  . Vaping Use: Never used  Substance and Sexual Activity  . Alcohol use: Yes    Alcohol/week: 0.0 standard drinks    Comment: Special Occasional takes Vicar  . Drug use: Yes    Types: Marijuana    Comment: ocassional, last time- 12/21/20  . Sexual activity: Not Currently    Birth control/protection: None  Other Topics Concern  . Not on file  Social History Narrative  . Not on file   Social Determinants of Health   Financial Resource Strain: Not on file  Food Insecurity: Not on file  Transportation Needs: Not on file  Physical Activity: Not on file  Stress: Not on file  Social Connections: Not on file  Intimate Partner Violence: Not on file    ALLERGIES:    Allergies  Allergen Reactions  . Tobramycin Sulfate Swelling    Eye swelling    CURRENT MEDICATIONS:    No current facility-administered medications for this encounter.   Current Outpatient Medications  Medication Sig Dispense Refill  . calcitRIOL (ROCALTROL) 0.5 MCG capsule Take 4  capsules (2 mcg total) by mouth Every Tuesday,Thursday,and Saturday with dialysis.    Marland Kitchen calcium acetate (PHOSLO) 667 MG capsule Take 1,334-2,668 mg by mouth See admin instructions. Take 4 capsule (2668 mg) by mouth three times a day with each meal and 2 capsules (1334 mg) with each snack    . carvedilol (COREG) 12.5 MG tablet Take 12.5 mg by mouth 2 (two) times daily with a meal.     . cephALEXin (KEFLEX) 500 MG capsule Take 1 capsule (500 mg total) by mouth daily. 7 capsule 0  . diphenhydrAMINE (BENADRYL) 25 MG tablet Take 25 mg by mouth at bedtime.     Marland Kitchen doxercalciferol (HECTOROL) 4 MCG/2ML injection Use as recommended by nephrology 2 mL   . heparin 1000 unit/mL SOLN injection 1,000 Units by Dialysis route one  time in dialysis.     . hydrOXYzine (ATARAX/VISTARIL) 10 MG tablet Take 1 tablet (10 mg total) by mouth 3 (three) times daily as needed for anxiety. (Patient taking differently: Take 10 mg by mouth 3 (three) times daily as needed for itching or anxiety.) 5 tablet 0  . loperamide (IMODIUM A-D) 2 MG tablet Take 4 mg by mouth daily as needed for diarrhea or loose stools.     . Methoxy PEG-Epoetin Beta (MIRCERA IJ) Inject 1 Dose as directed every 14 (fourteen) days.    . multivitamin (RENA-VIT) TABS tablet Take 1 tablet by mouth daily.    . Nutritional Supplements (NOVASOURCE RENAL) LIQD Take 237 mLs by mouth See admin instructions. Take 1 container (237 mls) by at bedtime on dialysis days (Tuesday, Thursday, Saturday)    . oxyCODONE-acetaminophen (PERCOCET/ROXICET) 5-325 MG tablet Take 1 tablet by mouth every 6 (six) hours as needed. 20 tablet 0    REVIEW OF SYSTEMS:   '[X]'$  denotes positive finding, '[ ]'$  denotes negative finding Cardiac  Comments:  Chest pain or chest pressure:    Shortness of breath upon exertion:    Short of breath when lying flat:    Irregular heart rhythm:        Vascular    Pain in calf, thigh, or hip brought on by ambulation:    Pain in feet at night that wakes you up from your sleep:     Blood clot in your veins:    Leg swelling:         Pulmonary    Oxygen at home:    Productive cough:     Wheezing:         Neurologic    Sudden weakness in arms or legs:     Sudden numbness in arms or legs:     Sudden onset of difficulty speaking or slurred speech:    Temporary loss of vision in one eye:     Problems with dizziness:         Gastrointestinal    Blood in stool:      Vomited blood:         Genitourinary    Burning when urinating:     Blood in urine:        Psychiatric    Major depression:         Hematologic    Bleeding problems:    Problems with blood clotting too easily:        Skin    Rashes or ulcers:        Constitutional    Fever or  chills:     PHYSICAL EXAM:   Vitals:   01/22/21  1032  BP: 111/70  Pulse: 84  Resp: 16  Temp: 98.5 F (36.9 C)  TempSrc: Oral  SpO2: 97%    GENERAL: The patient is a well-nourished female, in no acute distress. The vital signs are documented above. CARDIAC: There is a regular rate and rhythm.  VASCULAR: Palpable thrill within right basilic vein fistula.  The arm is edematous from the shoulder down.  There is slight skin separation from some of her surgical incisions.  There is evidence of a hematoma and the proximal arm.  This is not boggy. PULMONARY: Nonlabored respirations SKIN: There are no ulcers or rashes noted. PSYCHIATRIC: The patient has a normal affect.  STUDIES:   I have reviewed her ultrasound with the following findings: AVF appears patent with anastomosis measuring .38cm and proximal .57cm.  Volume flow is 1792 cc/min. No evidence of a stenosis or occlusion was  visualized.  Fluid is documented throughout upper arm. Arm is very edematous however no  intrusion of AVF is seen.  Small branch noted upper arm that leaves ceph and reenters almost  immediately.  Outflow is patent.  ASSESSMENT and PLAN   Right arm swelling: The patient is approximately 2 weeks out from her basilic vein fistula and she has significant swelling in the arm.  Ultrasound did not identify any significant stenosis.  She did not have any swelling following her for staged procedure.  Because of the appearance of her arm, I have recommended proceeding with fistulogram to evaluate for central stenosis.  I did discuss with the patient that because of her recent surgery and need for fistulogram, she is at slightly high risk for bleeding given that the fistula has not fully scarred in.  She understands this and we will proceed tomorrow.  All questions were answered.   Leia Alf, MD, FACS [facility] [phone] Pager [phone]

## 2021-01-22 NOTE — Progress Notes (Signed)
AVF duplex completed   Please see CV Proc for preliminary results.   Vonzell Schlatter, RVT

## 2021-01-22 NOTE — ED Triage Notes (Signed)
Emergency Medicine Provider Triage Evaluation Note  [name] , a 40 y.o. female  was evaluated in triage.  Pt complains of swelling and itching to lips after abx given at dialysis yesterday.  Patient esrd with recent revision of right arm fistual.  Patient with ongoing pain and swelling rue sine fistual revision.  She reports being given iv abx at dialysis yesterday.  During the night, she began having swelling and itching to left lip upper and lower.  She denies difficulty breathing, speaking, intraoral swelling  Review of Systems  Positive: Lip swelling itching Negative: Rash, n/v/d, lightheaded, dyspnea  Physical Exam  BP 111/70 (BP Location: Right Arm)   Pulse 84   Temp 98.5 F (36.9 C) (Oral)   Resp 16   SpO2 97%  Gen:   Awake, no distress   HEENT:  Atraumatic - left upper and lower lip with mild swelling and blistered area, no intraoral swelling Resp:  Normal effort  Cardiac:  Normal rate  Abd:   Nondistended, nontender  MSK:   Moves extremities without difficulty right upper extremity with dressing in place right upper arm, diffuse swelling, radial pulse intact Neuro:  Speech clear   Medical Decision Making  Medically screening exam initiated at 11:12 AM.  Appropriate orders placed.  [name] was informed that the remainder of the evaluation will be completed by another provider, this initial triage assessment does not replace that evaluation, and the importance of remaining in the ED until their evaluation is complete.  Clinical Impression  Labs Benadryl Likely will need to discuss with Dr. Donnetta Hutching Unknown which abx patient given   [name] Boss, MD 01/22/21 [phone]

## 2021-01-22 NOTE — H&P (View-Only) (Signed)
[facility] [place]  Patient name: [name] MRN: [id number] DOB: Sep 02, 1981 Sex: female   REQUESTING PROVIDER:   ER   REASON FOR CONSULT:    Dialysis access complication  HISTORY OF PRESENT ILLNESS:   [name] is a 40 y.o. female, who is status post second stage basilic vein fistula on [id number] by Dr. Donnetta Hutching.  She has been having pain and swelling in her arm since surgery.  She had a reaction to her antibiotics at dialysis and was sent to the hospital.  There was concern over infection in the arm  PAST MEDICAL HISTORY    Past Medical History:  Diagnosis Date  . Anemia    low iron - receives iron at dialysis  . Anxiety   . Arthritis    RA  . Chronic systolic congestive heart failure ([place]) 03/16/2016  . Dyspnea   . ESRD (end stage renal disease) ([place])    [facility] TTHSAT _ [place]  . H/O pericarditis 01/17/2013  . H/O pleural effusion 01/17/2013  . Heart murmur   . Lupus (systemic lupus erythematosus) (HCC)    Previously followed with Dr. Charlestine Night, has not followed up recently  . 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 Pneumonia    many times  . Polysubstance abuse ([place])    cocaine, MJ, tobacco  . S/P pericardiocentesis 01/17/2013   H/o pericardial effusion with tamponade 2006   . Seizures ([place])    during pregnancy 1 time  . Streptococcal bacteremia 01/23/2013   She had two S. pneumonae bacteremia on 01/21/2013. Sensitive to Peniccilin      FAMILY HISTORY   No family history on file.  SOCIAL HISTORY:   Social History   Socioeconomic History  . Marital status: Single    Spouse name: Not on file  . Number of children: Not on file  . Years of education: Not on file  . Highest education level: Not on file  Occupational History  . Not on file   Tobacco Use  . Smoking status: Current Every Day Smoker    Packs/day: 0.12    Years: 15.00    Pack years: 1.80    Types: Cigarettes  . Smokeless tobacco: Never Used  . Tobacco comment: 4 cigarettes per day  Vaping Use  . Vaping Use: Never used  Substance and Sexual Activity  . Alcohol use: Yes    Alcohol/week: 0.0 standard drinks    Comment: Special Occasional takes Vicar  . Drug use: Yes    Types: Marijuana    Comment: ocassional, last time- 12/21/20  . Sexual activity: Not Currently    Birth control/protection: None  Other Topics Concern  . Not on file  Social History Narrative  . Not on file   Social Determinants of Health   Financial Resource Strain: Not on file  Food Insecurity: Not on file  Transportation Needs: Not on file  Physical Activity: Not on file  Stress: Not on file  Social Connections: Not on file  Intimate Partner Violence: Not on file    ALLERGIES:    Allergies  Allergen Reactions  . Tobramycin Sulfate Swelling    Eye swelling    CURRENT MEDICATIONS:    No current facility-administered medications for this encounter.   Current Outpatient Medications  Medication Sig Dispense Refill  . calcitRIOL (ROCALTROL) 0.5 MCG capsule Take 4  capsules (2 mcg total) by mouth Every Tuesday,Thursday,and Saturday with dialysis.    Marland Kitchen calcium acetate (PHOSLO) 667 MG capsule Take 1,334-2,668 mg by mouth See admin instructions. Take 4 capsule (2668 mg) by mouth three times a day with each meal and 2 capsules (1334 mg) with each snack    . carvedilol (COREG) 12.5 MG tablet Take 12.5 mg by mouth 2 (two) times daily with a meal.     . cephALEXin (KEFLEX) 500 MG capsule Take 1 capsule (500 mg total) by mouth daily. 7 capsule 0  . diphenhydrAMINE (BENADRYL) 25 MG tablet Take 25 mg by mouth at bedtime.     Marland Kitchen doxercalciferol (HECTOROL) 4 MCG/2ML injection Use as recommended by nephrology 2 mL   . heparin 1000 unit/mL SOLN injection 1,000 Units by Dialysis route one  time in dialysis.     . hydrOXYzine (ATARAX/VISTARIL) 10 MG tablet Take 1 tablet (10 mg total) by mouth 3 (three) times daily as needed for anxiety. (Patient taking differently: Take 10 mg by mouth 3 (three) times daily as needed for itching or anxiety.) 5 tablet 0  . loperamide (IMODIUM A-D) 2 MG tablet Take 4 mg by mouth daily as needed for diarrhea or loose stools.     . Methoxy PEG-Epoetin Beta (MIRCERA IJ) Inject 1 Dose as directed every 14 (fourteen) days.    . multivitamin (RENA-VIT) TABS tablet Take 1 tablet by mouth daily.    . Nutritional Supplements (NOVASOURCE RENAL) LIQD Take 237 mLs by mouth See admin instructions. Take 1 container (237 mls) by at bedtime on dialysis days (Tuesday, Thursday, Saturday)    . oxyCODONE-acetaminophen (PERCOCET/ROXICET) 5-325 MG tablet Take 1 tablet by mouth every 6 (six) hours as needed. 20 tablet 0    REVIEW OF SYSTEMS:   '[X]'$  denotes positive finding, '[ ]'$  denotes negative finding Cardiac  Comments:  Chest pain or chest pressure:    Shortness of breath upon exertion:    Short of breath when lying flat:    Irregular heart rhythm:        Vascular    Pain in calf, thigh, or hip brought on by ambulation:    Pain in feet at night that wakes you up from your sleep:     Blood clot in your veins:    Leg swelling:         Pulmonary    Oxygen at home:    Productive cough:     Wheezing:         Neurologic    Sudden weakness in arms or legs:     Sudden numbness in arms or legs:     Sudden onset of difficulty speaking or slurred speech:    Temporary loss of vision in one eye:     Problems with dizziness:         Gastrointestinal    Blood in stool:      Vomited blood:         Genitourinary    Burning when urinating:     Blood in urine:        Psychiatric    Major depression:         Hematologic    Bleeding problems:    Problems with blood clotting too easily:        Skin    Rashes or ulcers:        Constitutional    Fever or  chills:     PHYSICAL EXAM:   Vitals:   01/22/21  1032  BP: 111/70  Pulse: 84  Resp: 16  Temp: 98.5 F (36.9 C)  TempSrc: Oral  SpO2: 97%    GENERAL: The patient is a well-nourished female, in no acute distress. The vital signs are documented above. CARDIAC: There is a regular rate and rhythm.  VASCULAR: Palpable thrill within right basilic vein fistula.  The arm is edematous from the shoulder down.  There is slight skin separation from some of her surgical incisions.  There is evidence of a hematoma and the proximal arm.  This is not boggy. PULMONARY: Nonlabored respirations SKIN: There are no ulcers or rashes noted. PSYCHIATRIC: The patient has a normal affect.  STUDIES:   I have reviewed her ultrasound with the following findings: AVF appears patent with anastomosis measuring .38cm and proximal .57cm.  Volume flow is 1792 cc/min. No evidence of a stenosis or occlusion was  visualized.  Fluid is documented throughout upper arm. Arm is very edematous however no  intrusion of AVF is seen.  Small branch noted upper arm that leaves ceph and reenters almost  immediately.  Outflow is patent.  ASSESSMENT and PLAN   Right arm swelling: The patient is approximately 2 weeks out from her basilic vein fistula and she has significant swelling in the arm.  Ultrasound did not identify any significant stenosis.  She did not have any swelling following her for staged procedure.  Because of the appearance of her arm, I have recommended proceeding with fistulogram to evaluate for central stenosis.  I did discuss with the patient that because of her recent surgery and need for fistulogram, she is at slightly high risk for bleeding given that the fistula has not fully scarred in.  She understands this and we will proceed tomorrow.  All questions were answered.   Leia Alf, MD, FACS [facility] [phone] Pager [phone]

## 2021-01-22 NOTE — H&P (Signed)
History and Physical   [name] [id number] DOB: [birth year] DOA: 01/22/2021  PCP: Patient, No Pcp Per   Patient coming from: Home  Chief Complaint: Pain and swelling of fistula site, swelling and itching of lips  HPI: [name] is a 40 y.o. 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  Patient has had revision of her right upper extremity fistula.  She had a second stage procedure done on 1/12 by vascular surgery.  She had some pain and swelling at the site and was started on Keflex outpatient on 1/19.  She only took 2 doses of the Keflex unfortunately.  Yesterday she was given a dose of IV antibiotics which according to the ED provider who spoke with her dialysis site was cefazolin and vancomycin.  This morning she woke up with itchy and swollen lips and also reports some drainage from her right upper extremity fistula site.  Drainage has largely been read to clear but states one occasion did have some puslike drainage but that seems to have improved.  She denies difficulty speaking or swallowing.  She also denies fever, chills, cough, shortness of breath, abdominal pain, nausea, constipation, diarrhea  ED Course: Vital signs in ED were stable.  Lab work-up showed BMP with creatinine of 8.7 consistent with her ESRD.  CBC showed hemoglobin of 9.3 which is stable for her.  She had a duplex ultrasound of her right upper extremity which showed no stenosis or occlusion of the fistula but did demonstrate fluid in the upper arm but no clear intrusion of the fistula.  Vascular surgery was consulted and are seen the patient and want to bring her in for fistulogram as a believe her swelling is less  likely to be infectious as opposed to malfunction of the fistula.  Review of Systems: As per HPI otherwise all other systems reviewed and are negative.  Past Medical History:  Diagnosis Date  . Anemia    low iron - receives iron at dialysis  . Anxiety   . Arthritis    RA  . Chronic systolic congestive heart failure ([place]) 03/16/2016  . Dyspnea   . ESRD (end stage renal disease) ([place])    [facility] _ [place]  . H/O pericarditis 01/17/2013  . H/O pleural effusion 01/17/2013  . Heart murmur   . Lupus (systemic lupus erythematosus) (HCC)    Previously followed with Dr. Charlestine Night, has not followed up recently  . 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 Pneumonia    many times  . Polysubstance abuse ([place])    cocaine, MJ, tobacco  . S/P pericardiocentesis 01/17/2013   H/o pericardial effusion with tamponade 2006   . Seizures ([place])    during pregnancy 1 time  . Streptococcal bacteremia 01/23/2013   She had two S. pneumonae bacteremia on 01/21/2013. Sensitive to Peniccilin     Past Surgical History:  Procedure Laterality Date  . AV FISTULA PLACEMENT    . AV FISTULA PLACEMENT Right 08/01/2020   Procedure: RIGHT ARM BRACHIOCEPHALIC  ARTERIOVENOUS (AV) FISTULA CREATION;  Surgeon: [name], MD;  Location: [place];  Service: Vascular;  Laterality: Right;  . BASCILIC VEIN TRANSPOSITION Left 02/05/2014   Procedure: [place];  Surgeon: [name], MD;  Location: [place];  Service: Vascular;  Laterality: Left;  . BASCILIC VEIN TRANSPOSITION Right 01/08/2021   Procedure: RIGHT ARM SECOND STAGE BASCILIC VEIN TRANSPOSITION;  Surgeon: [name], MD;  Location: [facility];  Service: Vascular;  Laterality: Right;  . FISTULA SUPERFICIALIZATION Left 05/30/2018   Procedure: FISTULA PLICATION BASILIC VEIN TRANSPOSITION;  Surgeon: Angelia Mould, MD;  Location: [facility];  Service: Vascular;  Laterality: Left;  . FISTULA SUPERFICIALIZATION Left [id number]   Procedure: PLICATION OF LEFT ARTERIOVENOUS FISTULA ULCER;  Surgeon: [name], MD;  Location: [facility];  Service: Vascular;  Laterality: Left;  . INSERTION OF DIALYSIS CATHETER N/A 05/19/2020   Procedure: TUNNELED INSERTION  OF DIALYSIS CATHETER;  Surgeon: Waynetta Sandy, MD;  Location: [place];  Service: Vascular;  Laterality: N/A;  . THROMBECTOMY AND REVISION OF ARTERIOVENTOUS (AV) GORETEX  GRAFT Left 07/28/2020   Procedure: Oversewing of left arm Brachial cephalic fistula for bleeding.;  Surgeon: Angelia Mould, MD;  Location: [facility];  Service: Vascular;  Laterality: Left;  Marland Kitchen VENOGRAM Right 01/31/2014   Procedure: DIALYSIS CATHETER;  Surgeon: Serafina Mitchell, MD;  Location: [facility] CATH LAB;  Service: Cardiovascular;  Laterality: Right;    Social History  reports that she has been smoking cigarettes. She has a 1.80 pack-year smoking history. She has never used smokeless tobacco. She reports current alcohol use. She reports current drug use. Drug: Marijuana.  Allergies  Allergen Reactions  . Tobramycin Sulfate Swelling    Eye swelling    No family history on file. Reviewed on admission  Prior to Admission medications   Medication Sig Start Date End Date Taking? Authorizing Provider  calcitRIOL (ROCALTROL) 0.5 MCG capsule Take 4 capsules (2 mcg total) by mouth Every Tuesday,Thursday,and Saturday with dialysis. 11/07/20   Hosie Poisson, MD  calcium acetate (PHOSLO) 667 MG capsule Take 1,334-2,668 mg by mouth See admin instructions. Take 4 capsule (2668 mg) by mouth three times a day with each meal and 2 capsules (1334 mg) with each snack    [provider]  carvedilol (COREG) 12.5 MG tablet Take 12.5 mg by mouth 2 (two) times daily with a meal.  11/07/19   [provider]  cephALEXin (KEFLEX) 500 MG capsule Take 1 capsule (500 mg total) by  mouth daily. 01/15/21   Rhyne, Hulen Shouts, PA-C  diphenhydrAMINE (BENADRYL) 25 MG tablet Take 25 mg by mouth at bedtime.     [provider]  doxercalciferol (HECTOROL) 4 MCG/2ML injection Use as recommended by nephrology 11/05/20   Hosie Poisson, MD  heparin 1000 unit/mL SOLN injection 1,000 Units by Dialysis route one time in dialysis.  05/21/20 06/05/21  [provider]  hydrOXYzine (ATARAX/VISTARIL) 10 MG tablet Take 1 tablet (10 mg total) by mouth 3 (three) times daily as needed for anxiety. Patient taking differently: Take 10 mg by mouth 3 (three) times daily as needed for itching or anxiety. 07/29/19   Caccavale, Sophia, PA-C  loperamide (IMODIUM A-D) 2 MG tablet Take 4 mg by mouth daily as needed for diarrhea or loose stools.     [provider]  Methoxy PEG-Epoetin Beta (MIRCERA IJ) Inject 1 Dose as directed every 14 (fourteen) days. 03/16/20 03/15/21  [provider]  multivitamin (RENA-VIT) TABS tablet Take 1 tablet by mouth daily.    [provider]  Nutritional Supplements (NOVASOURCE RENAL) LIQD Take  237 mLs by mouth See admin instructions. Take 1 container (237 mls) by at bedtime on dialysis days (Tuesday, Thursday, Saturday)    [provider]  oxyCODONE-acetaminophen (PERCOCET/ROXICET) 5-325 MG tablet Take 1 tablet by mouth every 6 (six) hours as needed. 01/15/21   Gabriel Earing, PA-C    Physical Exam: Vitals:   01/22/21 1032  BP: 111/70  Pulse: 84  Resp: 16  Temp: 98.5 F (36.9 C)  TempSrc: Oral  SpO2: 97%   Physical Exam Constitutional:      General: She is not in acute distress.    Appearance: Normal appearance.  HENT:     Head: Normocephalic and atraumatic.     Mouth/Throat:     Mouth: Mucous membranes are moist.     Pharynx: Oropharynx is clear.     Comments: Possible trace residual lip swelling and cracking Eyes:     Extraocular Movements: Extraocular movements intact.     Pupils: Pupils are equal, round, and  reactive to light.  Cardiovascular:     Rate and Rhythm: Normal rate and regular rhythm.     Pulses: Normal pulses.     Heart sounds: Murmur heard.    Pulmonary:     Effort: Pulmonary effort is normal. No respiratory distress.     Breath sounds: Normal breath sounds.  Abdominal:     General: Bowel sounds are normal. There is no distension.     Palpations: Abdomen is soft.     Tenderness: There is no abdominal tenderness.  Musculoskeletal:        General: No swelling or deformity.  Skin:    General: Skin is warm and dry.     Comments: Edema and scant serosanguineous drainage at right upper extremity fistula site  Neurological:     General: No focal deficit present.     Mental Status: Mental status is at baseline.   Scant serosanguineous drainage at revision Labs on Admission: I have personally reviewed following labs and imaging studies  CBC: Recent Labs  Lab 01/22/21 1132  WBC 6.4  HGB 9.3*  HCT 30.1*  MCV 91.8  PLT [id number]    Basic Metabolic Panel: Recent Labs  Lab 01/22/21 1132  NA 138  K 4.5  CL 96*  CO2 27  GLUCOSE 120*  BUN 56*  CREATININE 8.70*  CALCIUM 8.9    GFR: Estimated Creatinine Clearance: 7 mL/min (A) (by C-G formula based on SCr of 8.7 mg/dL (H)).  Liver Function Tests: No results for input(s): AST, ALT, ALKPHOS, BILITOT, PROT, ALBUMIN in the last 168 hours.  Urine analysis:    Component Value Date/Time   COLORURINE YELLOW 09/25/2015 1412   APPEARANCEUR CLOUDY (A) 09/25/2015 1412   LABSPEC 1.018 09/25/2015 1412   PHURINE 8.0 09/25/2015 1412   GLUCOSEU NEGATIVE 09/25/2015 1412   HGBUR LARGE (A) 09/25/2015 1412   BILIRUBINUR NEGATIVE 09/25/2015 1412   KETONESUR NEGATIVE 09/25/2015 1412   PROTEINUR >300 (A) 09/25/2015 1412   UROBILINOGEN 0.2 09/25/2015 1412   NITRITE NEGATIVE 09/25/2015 1412   LEUKOCYTESUR MODERATE (A) 09/25/2015 1412    Radiological Exams on Admission: VAS [name] (AVF, AVG)  Result Date:  01/22/2021 DIALYSIS ACCESS Reason for Exam: Swelling and pain (New AVF). Access Site: Right Upper Extremity. Access Type: Brachial-cephalic AVF. Performing Technologist: Vonzell Schlatter RVT  Examination Guidelines: A complete evaluation includes B-mode imaging, spectral Doppler, color Doppler, and power Doppler as needed of all accessible portions of each vessel. Unilateral testing is considered an integral part  of a complete examination. Limited examinations for reoccurring indications may be performed as noted.  Findings: +---------------+----------+-----------------+--------+ AVF            PSV (cm/s)Flow Vol (mL/min)Comments +---------------+----------+-----------------+--------+ AVF Anastomosis                1792                +---------------+----------+-----------------+--------+    Summary: AVF appears patent with anastomosis measuring .38cm and proximal .57cm. Volume flow is 1792 cc/min. No evidence of a stenosis or occlusion was visualized. Fluid is documented throughout upper arm. Arm is very edematous however no intrusion of AVF is seen. Small branch noted upper arm that leaves ceph and reenters almost immediately. Outflow is patent.  *See table(s) above for measurements and observations.   --------------------------------------------------------------------------------   Preliminary     EKG: Not yet performed  Assessment/Plan Principal Problem:   Hemodialysis access, fistula mature ([place]) Active Problems:   ESRD on dialysis ([place])   Chronic combined systolic (congestive) and diastolic (congestive) heart failure (HCC)   Allergic reaction  Pain swelling at fistula site > Has been ongoing and somewhat worsening since the day after her surgery on 1/12. > Has received a couple doses of Keflex outpatient as well as a dose of IV antibiotics, vancomycin and cefazolin, at HD yesterday.  She does appear to have had a reaction to 1 of those antibiotics as she woke up with blisters on her mouth  and swollen lips. > Currently her fistula site pain swelling with fluid on Doppler is suspected not to be infectious in nature due to lack of leukocytosis or organization on vascular ultrasound.  Vascular surgery believes this is most likely a problem with the fistula itself and plan to do a fistulogram. - Appreciate vascular surgery assistance with this patient - Hold off on further antibiotics for now - Fistulogram in the morning  Allergic reaction > As above head swelling and blistering of her lips after receiving vancomycin and cefazolin with HD yesterday > This is improved with Benadryl and steroids in the ED - Continue as needed Benadryl for itching - Unclear as to which antibiotic may have triggered the reaction she has received multiple doses of both antibiotics in the past since she has had more exposure to cefazolin recently been vancomycin.  Does have a history of eye swelling to tobramycin.  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 > Last HD session was yesterday - We will need consult to nephrology for dialysis - Continue home calcitriol, PhosLo, Rena-Vite, supplemental nutrition - Avoid nephrotoxic agents - Trend renal function  Heart failure > Volume status stable - Continue volume control with dialysis - Monitor I/Os, daily weights - Continue carvedilol  DVT prophylaxis: Heparin  Code Status:   Full  Family Communication:  None on admission Disposition Plan:   Patient is from:  Home  Anticipated DC to:  Home  Anticipated DC date:  1 to 3 days  Anticipated DC barriers: None  Consults called:  Vascular surgery consulted by EDP, will need nephrology consult in the a.m. for HD Admission status:  Observation, MedSurg  Severity of Illness: The appropriate patient status for this patient is OBSERVATION. Observation status is judged to be reasonable and necessary in order to provide the required intensity of service to ensure the patient's safety. The patient's presenting symptoms,  physical exam findings, and initial radiographic and laboratory data in the context of their medical condition is felt to place them at decreased risk  for further clinical deterioration. Furthermore, it is anticipated that the patient will be medically stable for discharge from the hospital within 2 midnights of admission. The following factors support the patient status of observation.   " The patient's presenting symptoms include right arm pain and swelling, lip swelling and itching. " The physical exam findings include right arm swelling at fistula site, possible trace residual lip swelling. " The initial radiographic and laboratory data are stable creatinine somewhat elevated from her baseline though this is variable given she is on HD, hemoglobin stable 9.3.  Duplex ultrasound right upper extremity did not show stenosis or occlusion of fistula but did see fluid within the arm.  Marcelyn Bruins MD [facility]  How to contact the [facility] Attending or Consulting provider [place] or covering provider during after hours [place], for this patient?   1. Check the care team in [facility] and look for a) attending/consulting [facility] provider listed and b) the [facility] team listed 2. Log into [email or web address] and use [place]'s universal password to access. If you do not have the password, please contact the hospital operator. 3. Locate the [facility] provider you are looking for under [facility] and page to a number that you can be directly reached. 4. If you still have difficulty reaching the provider, please page the [facility] (Director on Call) for the Hospitalists listed on amion for assistance.  01/22/2021, 6:39 PM

## 2021-01-23 ENCOUNTER — Other Ambulatory Visit: Payer: Self-pay

## 2021-01-23 DIAGNOSIS — Z992 Dependence on renal dialysis: Secondary | ICD-10-CM | POA: Diagnosis not present

## 2021-01-23 LAB — RENAL FUNCTION PANEL
Albumin: 2.2 g/dL — ABNORMAL LOW (ref 3.5–5.0)
Anion gap: 17 — ABNORMAL HIGH (ref 5–15)
BUN: 75 mg/dL — ABNORMAL HIGH (ref 6–20)
CO2: 21 mmol/L — ABNORMAL LOW (ref 22–32)
Calcium: 9 mg/dL (ref 8.9–10.3)
Chloride: 95 mmol/L — ABNORMAL LOW (ref 98–111)
Creatinine, Ser: 10.25 mg/dL — ABNORMAL HIGH (ref 0.44–1.00)
GFR, Estimated: 5 mL/min — ABNORMAL LOW (ref >60)
Glucose, Bld: 112 mg/dL — ABNORMAL HIGH (ref 70–99)
Phosphorus: 6.3 mg/dL — ABNORMAL HIGH (ref 2.5–4.6)
Potassium: 5.8 mmol/L — ABNORMAL HIGH (ref 3.5–5.1)
Sodium: 133 mmol/L — ABNORMAL LOW (ref 135–145)

## 2021-01-23 LAB — CBC
HCT: 32.2 % — ABNORMAL LOW (ref 36.0–46.0)
Hemoglobin: 9.9 g/dL — ABNORMAL LOW (ref 12.0–15.0)
MCH: 28.4 pg (ref 26.0–34.0)
MCHC: 30.7 g/dL (ref 30.0–36.0)
MCV: 92.5 fL (ref 80.0–100.0)
Platelets: 312 10*3/uL (ref 150–400)
RBC: 3.48 MIL/uL — ABNORMAL LOW (ref 3.87–5.11)
RDW: 18.1 % — ABNORMAL HIGH (ref 11.5–15.5)
WBC: 5.7 10*3/uL (ref 4.0–10.5)
nRBC: 0.9 % — ABNORMAL HIGH (ref 0.0–0.2)

## 2021-01-23 LAB — HCG, SERUM, QUALITATIVE: Preg, Serum: NEGATIVE

## 2021-01-23 MED ORDER — SODIUM CHLORIDE 0.9 % IV SOLN: 100.0000 mg | Freq: Every day | INTRAVENOUS | Status: DC

## 2021-01-23 MED ORDER — SODIUM CHLORIDE 0.9% FLUSH: 3.0000 mL | Freq: Two times a day (BID) | INTRAVENOUS | Status: DC

## 2021-01-23 MED ORDER — DIPHENHYDRAMINE HCL 25 MG PO TABS: 25.0000 mg | ORAL_TABLET | ORAL | 0 refills | Status: DC | PRN

## 2021-01-23 MED ORDER — SODIUM CHLORIDE 0.9% FLUSH: 3.0000 mL | INTRAVENOUS | Status: DC | PRN

## 2021-01-23 MED ORDER — SODIUM CHLORIDE 0.9 % IV SOLN: 250.0000 mL | INTRAVENOUS | Status: DC | PRN

## 2021-01-23 MED ORDER — OXYCODONE-ACETAMINOPHEN 5-325 MG PO TABS: 1.0000 | ORAL_TABLET | Freq: Three times a day (TID) | ORAL | 0 refills | Status: AC | PRN

## 2021-01-23 MED ORDER — SODIUM CHLORIDE 0.9 % IV SOLN
200.0000 mg | Freq: Once | INTRAVENOUS | Status: AC
  Administered 2021-01-23: 200 mg via INTRAVENOUS
  Filled 2021-01-23: qty 200

## 2021-01-23 MED ORDER — FAMOTIDINE 20 MG PO TABS: 20.0000 mg | ORAL_TABLET | Freq: Every day | ORAL | 0 refills | Status: DC

## 2021-01-23 NOTE — Progress Notes (Signed)
Renal Navigator greatly appreciates secure message from Attending noting COVID positive HD patient in ED without plans for admission, but in need of outpatient HD/transportation arranged due to COVID positive status.  Navigator alerted [facility] of positive status and to expect patient for treatment tomorrow. Navigator spoke with patient's mother (she answered phone number listed for patient in [place]) to inform of COVID isolation shift: MWF 4:15pm arrive to [place] and call from parking lot before entering. Clinic will call patient if there are any changes to this plan. Patient will dialyze in isolation for at least 14 days from positive test. Patient's mother was very appreciative and confirms that she will transport patient.  No barriers to discharge.  Alphonzo Cruise, [place] Renal Navigator [phone]

## 2021-01-23 NOTE — ED Notes (Signed)
Patient states she doesn't want to stay is wanting to go home Dr. Trula Slade paged.

## 2021-01-23 NOTE — ED Notes (Signed)
Pt ambulated to restroom. 

## 2021-01-23 NOTE — Progress Notes (Signed)
The fistulogram procedure cannot be performed until the patient has a actual bed in the hospital, where she can recover from the procedure.  She is now Covid positive it does not appear that there will be a bed immediately available.  The patient wants to go home.  Arrangements are being made for this to happen.  This can be scheduled as an outpatient which my office will arrange.  Annamarie Major

## 2021-01-23 NOTE — Progress Notes (Signed)
Patient scheduled for outpatient Remdesivir infusions at 1:30pm on Friday 1/28 and Saturday 1/29 at [facility]. Please inform the patient to park at [place], as staff will be escorting the patient through the [place] entrance of the hospital. Appointments take approximately 45 minutes.    There is a wave flag banner located near the entrance on [address]. Turn into this entrance and immediately turn left or right and park in 1 of the 10 designated Covid Infusion Parking spots. There is a phone number on the sign, please call and let the staff know what spot you are in and we will come out and get you. For questions call [phone].  Thanks.

## 2021-01-23 NOTE — Discharge Instructions (Signed)
You are scheduled for an outpatient Remdesivir infusion at 1:30pm on Friday 1/28 and Saturday 1/29 at [facility]. Please park at [place], as staff will be escorting you through the [place] entrance of the hospital. Appointments take approximately 45 minutes.    The address for the infusion clinic site is:  --GPS address is [place] - the parking is located near [employer] building where you will see  COVID19 Infusion feather banner marking the entrance to parking.   (see photos below)            --Enter into the 2nd entrance where the "wave, flag banner" is at the road. Turn into this 2nd entrance and immediately turn left to park in 1 of the 5 parking spots.   --Please stay in your car and call the desk for assistance inside [phone].   The day of your visit you should: Marland Kitchen Get plenty of rest the night before and drink plenty of water . Eat a light meal/snack before coming and take your medications as prescribed  . Wear warm, comfortable clothes with a shirt that can roll-up over the elbow (will need IV start).  . Wear a mask  . Consider bringing some activity to help pass the time

## 2021-01-23 NOTE — ED Notes (Signed)
Admitting MD at bedside. Will give patient meds for covid and then discharge, patient is agreeable.

## 2021-01-24 ENCOUNTER — Ambulatory Visit (HOSPITAL_COMMUNITY)
Admission: RE | Admit: 2021-01-24 | Discharge: 2021-01-24 | Disposition: A | Discharge status: Home / Self Care | Payer: Medicaid Other | Source: Ambulatory Visit | Attending: Pulmonary Disease | Admitting: Pulmonary Disease

## 2021-01-24 DIAGNOSIS — U071 COVID-19: Secondary | ICD-10-CM | POA: Insufficient documentation

## 2021-01-24 DIAGNOSIS — Z1152 Encounter for screening for COVID-19: Secondary | ICD-10-CM | POA: Insufficient documentation

## 2021-01-24 DIAGNOSIS — J1289 Other viral pneumonia: Secondary | ICD-10-CM | POA: Diagnosis not present

## 2021-01-24 MED ORDER — EPINEPHRINE 0.3 MG/0.3ML IJ SOAJ: 0.3000 mg | Freq: Once | INTRAMUSCULAR | Status: DC | PRN

## 2021-01-24 MED ORDER — METHYLPREDNISOLONE SODIUM SUCC 125 MG IJ SOLR: 125.0000 mg | Freq: Once | INTRAMUSCULAR | Status: DC | PRN

## 2021-01-24 MED ORDER — ALBUTEROL SULFATE HFA 108 (90 BASE) MCG/ACT IN AERS: 2.0000 | INHALATION_SPRAY | Freq: Once | RESPIRATORY_TRACT | Status: DC | PRN

## 2021-01-24 MED ORDER — DIPHENHYDRAMINE HCL 50 MG/ML IJ SOLN: 50.0000 mg | Freq: Once | INTRAMUSCULAR | Status: DC | PRN

## 2021-01-24 MED ORDER — SODIUM CHLORIDE 0.9 % IV SOLN: INTRAVENOUS | Status: DC | PRN

## 2021-01-24 MED ORDER — FAMOTIDINE IN NACL 20-0.9 MG/50ML-% IV SOLN: 20.0000 mg | Freq: Once | INTRAVENOUS | Status: DC | PRN

## 2021-01-24 MED ORDER — SODIUM CHLORIDE 0.9 % IV SOLN
100.0000 mg | Freq: Once | INTRAVENOUS | Status: AC
  Administered 2021-01-24: 100 mg via INTRAVENOUS

## 2021-01-24 NOTE — Progress Notes (Signed)
  Diagnosis: COVID-19  Physician: Dr. Asencion Noble  Procedure: Covid Infusion Clinic Med: remdesivir infusion - Provided patient with remdesivir fact sheet for patients, parents and caregivers prior to infusion.  Complications: No immediate complications noted.  Discharge: Discharged home   [name] 01/24/2021

## 2021-01-24 NOTE — Discharge Instructions (Signed)
10 Things You Can Do to Manage Your COVID-19 Symptoms at Home °If you have possible or confirmed COVID-19: °1. Stay home except to get medical care. °2. Monitor your symptoms carefully. If your symptoms get worse, call your healthcare provider immediately. °3. Get rest and stay hydrated. °4. If you have a medical appointment, call the healthcare provider ahead of time and tell them that you have or may have COVID-19. °5. For medical emergencies, call 911 and notify the dispatch personnel that you have or may have COVID-19. °6. Cover your cough and sneezes with a tissue or use the inside of your elbow. °7. Wash your hands often with soap and water for at least 20 seconds or clean your hands with an alcohol-based hand sanitizer that contains at least 60% alcohol. °8. As much as possible, stay in a specific room and away from other people in your home. Also, you should use a separate bathroom, if available. If you need to be around other people in or outside of the home, wear a mask. °9. Avoid sharing personal items with other people in your household, like dishes, towels, and bedding. °10. Clean all surfaces that are touched often, like counters, tabletops, and doorknobs. Use household cleaning sprays or wipes according to the label instructions. °cdc.gov/coronavirus °07/12/2020 °This information is not intended to replace advice given to you by your health care provider. Make sure you discuss any questions you have with your health care provider. °Document Revised: 10/28/2020 Document Reviewed: 10/28/2020 °Elsevier Patient Education © 2021 Elsevier Inc. °If you have any questions or concerns after the infusion please call the Advanced Practice Provider on call at [phone]. This number is ONLY intended for your use regarding questions or concerns about the infusion post-treatment side-effects.  Please do not provide this number to others for use. For return to work notes please contact your primary care provider.   ° °If someone you know is interested in receiving treatment please have them contact their MD for a referral or visit [email or web address] ° ° ° °

## 2021-01-24 NOTE — Progress Notes (Signed)
Patient reviewed Fact Sheet for Patients, Parents, and Caregivers for Emergency Use Authorization (EUA) of remdesivir for the Treatment of Coronavirus. Patient also reviewed and is agreeable to the estimated cost of treatment. Patient is agreeable to proceed.    

## 2021-01-25 ENCOUNTER — Ambulatory Visit (HOSPITAL_COMMUNITY): Payer: Medicaid Other

## 2021-01-25 NOTE — Discharge Summary (Signed)
[facility] Discharge Summary   Patient: [name] [id number]  PCP: Patient, No Pcp Per  Date of admission: 01/22/2021   Date of discharge: 01/23/2021     Discharge Diagnoses:  Principal Problem:   Hemodialysis access, fistula mature ([place]) Active Problems:   ESRD on dialysis ([place])   Chronic combined systolic (congestive) and diastolic (congestive) heart failure (HCC)   Allergic reaction  Admitted From: home Disposition:  Home   Recommendations for Outpatient Follow-up:  1. PCP: Follow-up with PCP in 1 week. Follow-up with vascular surgery as recommended. 2. Go to remdesivir infusion clinic for two more days of remdesivir therapy 3. Follow up LABS/TEST: None   Follow-up Information    PCP. Schedule an appointment as soon as possible for a visit in 1 week(s).              Discharge Instructions    Diet - low sodium heart healthy   Complete by: As directed    Increase activity slowly   Complete by: As directed       Diet recommendation: Renal diet  Activity: The patient is advised to gradually reintroduce usual activities, as tolerated  Discharge Condition: stable  Code Status: Full code   History of present illness: As per the H and P dictated on admission, "[name] is a 40 y.o. 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             Patient has had revision of her right upper extremity fistula.  She had a second stage procedure done on 1/12 by vascular surgery.  She had some pain and swelling at the site and was started on Keflex outpatient on 1/19.  She only took 2 doses of the Keflex unfortunately.  Yesterday she was given a dose of IV antibiotics which  according to the ED provider who spoke with her dialysis site was cefazolin and vancomycin.  This morning she woke up with itchy and swollen lips and also reports some drainage from her right upper extremity fistula site.  Drainage has largely been read to clear but states one occasion did have some puslike drainage but that seems to have improved.             She denies difficulty speaking or swallowing.  She also denies fever, chills, cough, shortness of breath, abdominal pain, nausea, constipation, diarrhea"  Hospital Course:  Summary of her active problems in the hospital is as following.   Pain swelling at fistula site Has been ongoing and somewhat worsening since the day after her surgery on 1/12. Seen for follow-up in the clinic and was given Keflex. Per patient on 1/26 she was given vancomycin and cefazolin at HD and had blister developed on her lips after that. Vascular surgery was consulted. Recommended fistulogram. Initial plan was to perform that in the hospital but later on vascular surgery recommended that it would be safe to perform the procedure 2 weeks later as well. Patient discharged home with follow-up with vascular surgery. Currently no evidence of active infection. No further antibiotics are recommended for this condition.  Allergic reaction to cephalosporin As above head swelling and blistering of her lips after receiving vancomycin and cefazolin with HD yesterday This is improved  with Benadryl and steroids in the ED She tells me that vancomycin is something that she has tolerated in the past. Continue Pepcid and Benadryl on discharge. As well as short course of prednisone.  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 No complication. Continue hemodialysis outpatient. Social worker for nephrology was consulted to coordinate HD outpatient for Covid positive patient.  Chronic systolic CHF Elevated troponin not consistent with ACS. Echocardiogram showed EF  30 to 35% in November 2021. Currently no chest pain no chest tightness no shortness of breath. - Continue carvedilol  Pain control  - [place] Controlled Substance Reporting System database was reviewed. - 5 day supply was provided. - Patient was instructed, not to drive, operate heavy machinery, perform activities at heights, swimming or participation in water activities or provide baby sitting services while on Pain, Sleep and Anxiety Medications; until her outpatient Physician has advised to do so again.  - Also recommended to not to take more than prescribed Pain, Sleep and Anxiety Medications.  Patient was ambulatory without any assistance. On the day of the discharge the patient's vitals were stable, and no other acute medical condition were reported by patient. The patient was felt safe to be discharge at Home with no therapy needed on discharge.  Consultants: vascular surgery  Procedures: none  DISCHARGE MEDICATION: Allergies as of 01/23/2021      Reactions   Cephalosporins Rash   To both keflex and cefazolin   Tobramycin Sulfate Swelling   Eye swelling      Medication List    STOP taking these medications   cephALEXin 500 MG capsule Commonly known as: [place] these medications   calcitRIOL 0.5 MCG capsule Commonly known as: ROCALTROL Take 4 capsules (2 mcg total) by mouth Every Tuesday,Thursday,and Saturday with dialysis.   calcium acetate 667 MG capsule Commonly known as: PHOSLO Take 1,334-2,668 mg by mouth See admin instructions. Take 4 capsule (2668 mg) by mouth three times a day with each meal and 2 capsules (1334 mg) with each snack   carvedilol 12.5 MG tablet Commonly known as: COREG Take 12.5 mg by mouth 2 (two) times daily with a meal.   cinacalcet 30 MG tablet Commonly known as: SENSIPAR Take 30 mg by mouth as needed (calcium reducer).   diphenhydrAMINE 25 MG tablet Commonly known as: BENADRYL Take 1 tablet (25 mg total) by mouth every  4 (four) hours as needed for itching or allergies. What changed:   when to take this  reasons to take this   doxercalciferol 4 MCG/2ML injection Commonly known as: HECTOROL Use as recommended by nephrology   famotidine 20 MG tablet Commonly known as: Pepcid Take 1 tablet (20 mg total) by mouth daily for 10 days.   heparin 1000 unit/mL Soln injection 1,000 Units by Dialysis route one time in dialysis.   hydrOXYzine 10 MG tablet Commonly known as: ATARAX/VISTARIL Take 1 tablet (10 mg total) by mouth 3 (three) times daily as needed for anxiety. What changed: reasons to take this   lidocaine-prilocaine cream Commonly known as: EMLA Apply 1 application topically 3 (three) times a week.   loperamide 2 MG tablet Commonly known as: IMODIUM A-D Take 4 mg by mouth daily as needed for diarrhea or loose stools.   MIRCERA IJ Inject 1 Dose as directed every 14 (fourteen) days.   multivitamin Tabs tablet Take 1 tablet by mouth daily.   [employer] Renal Liqd Take 237 mLs by mouth See admin  instructions. Take 1 container (237 mls) by at bedtime on dialysis days (Tuesday, Thursday, Saturday)   oxyCODONE-acetaminophen 5-325 MG tablet Commonly known as: PERCOCET/ROXICET Take 1 tablet by mouth every 8 (eight) hours as needed for up to 5 days. What changed: when to take this       Discharge Exam: There were no vitals filed for this visit. Vitals:   01/23/21 0708 01/23/21 1446  BP: 140/80 (!) 139/94  Pulse: 80 80  Resp: 18 17  Temp: 98 F (36.7 C) 98 F (36.7 C)  SpO2: 97% 94%   General: Appear in no distress, no Rash; Oral Mucosa Clear, moist. no Abnormal Neck Mass Or lumps, Conjunctiva normal  Cardiovascular: S1 and S2 Present, no Murmur Respiratory: good respiratory effort, Bilateral Air entry present and CTA, no Crackles, no wheezes Abdomen: Bowel Sound present, Soft and no tenderness Extremities: no Pedal edema, swelling of fistula six extremity but no warmth no  tenderness. No decreased range of motion. Neurology: alert and oriented to time, place, and person affect appropriate. no new focal deficit  The results of significant diagnostics from this hospitalization (including imaging, microbiology, ancillary and laboratory) are listed below for reference.    Significant Diagnostic Studies: VAS US DUPLEX DIALYSIS ACCESS (AVF, AVG)  Result Date: 01/22/2021 DIALYSIS ACCESS Reason for Exam: Swelling and pain (New AVF). Access Site: Right Upper Extremity. Access Type: Brachial-cephalic AVF. Performing Technologist: Vonzell Schlatter RVT  Examination Guidelines: A complete evaluation includes B-mode imaging, spectral Doppler, color Doppler, and power Doppler as needed of all accessible portions of each vessel. Unilateral testing is considered an integral part of a complete examination. Limited examinations for reoccurring indications may be performed as noted.  Findings: +---------------+----------+-----------------+--------+ AVF            PSV (cm/s)Flow Vol (mL/min)Comments +---------------+----------+-----------------+--------+ AVF Anastomosis                1792                +---------------+----------+-----------------+--------+    Summary: AVF appears patent with anastomosis measuring .38cm and proximal .57cm. Volume flow is 1792 cc/min. No evidence of a stenosis or occlusion was visualized. Fluid is documented throughout upper arm. Arm is very edematous however no intrusion of AVF is seen. Small branch noted upper arm that leaves ceph and reenters almost immediately. Outflow is patent.  *See table(s) above for measurements and observations.  Diagnosing physician: Harold Barban MD Electronically signed by Harold Barban MD on 01/22/2021 at 9:36:48 PM.   --------------------------------------------------------------------------------   Final    VAS [name] (AVF,AVG)  Result Date: 01/01/2021 DIALYSIS ACCESS Reason for Exam: Unable to dialyze  through AVF/AVG. Access Site: Right Upper Extremity. Access Type: Brachiobasilic creation [id number] Performing Technologist: Ronal Fear RVS, RCS  Examination Guidelines: A complete evaluation includes B-mode imaging, spectral Doppler, color Doppler, and power Doppler as needed of all accessible portions of each vessel. Unilateral testing is considered an integral part of a complete examination. Limited examinations for reoccurring indications may be performed as noted.  Findings: +--------------------+----------+-----------------+--------+ AVF                 PSV (cm/s)Flow Vol (mL/min)Comments +--------------------+----------+-----------------+--------+ Native artery inflow   259          1069                +--------------------+----------+-----------------+--------+ AVF Anastomosis        274                              +--------------------+----------+-----------------+--------+  +------------+---------+------------+---------+-------------------------------+  OUTFLOW VEIN   PSV     Diameter    Depth             Describe                          (cm/s)      (cm)      (cm)                                   +------------+---------+------------+---------+-------------------------------+ Prox UA        73        0.86      0.46                                   +------------+---------+------------+---------+-------------------------------+ Mid UA         134       0.86      0.51                                   +------------+---------+------------+---------+-------------------------------+ Dist UA        305       0.94      0.39        competing branch and                                                          non-occlusive          +------------+---------+------------+---------+-------------------------------+ AC Fossa       372       0.51      0.32                                    +------------+---------+------------+---------+-------------------------------+   Summary: Patent arteriovenous fistula with one competing branch observed. There is a segment of partially-occlusive thrombus (mild) in the distal upper arm segment. *See table(s) above for measurements and observations.  Diagnosing physician: Deitra Mayo MD Electronically signed by Deitra Mayo MD on 01/01/2021 at 1:00:13 PM.   --------------------------------------------------------------------------------   Final     Microbiology: Recent Results (from the past 240 hour(s))  SARS Coronavirus 2 by RT PCR (hospital order, performed in [facility] hospital lab) Nasopharyngeal Nasopharyngeal Swab     Status: Abnormal   Collection Time: 01/22/21 10:22 PM   Specimen: Nasopharyngeal Swab  Result Value Ref Range Status   SARS Coronavirus 2 POSITIVE (A) NEGATIVE Final    Comment: RESULT CALLED TO, READ BACK BY AND VERIFIED WITH: B ADKINS RN 01/22/21 2345 JDW (NOTE) SARS-CoV-2 target nucleic acids are DETECTED  SARS-CoV-2 RNA is generally detectable in upper respiratory specimens  during the acute phase of infection.  Positive results are indicative  of the presence of the identified virus, but do not rule out bacterial infection or co-infection with other pathogens not detected by the test.  Clinical correlation with patient history and  other diagnostic information is necessary to determine patient infection status.  The expected result is negative.  Fact Sheet for Patients:   StrictlyIdeas.no  Fact Sheet for Healthcare Providers:   BankingDealers.co.za    This test is not yet approved or cleared by the [place] FDA and  has been authorized for detection and/or diagnosis of SARS-CoV-2 by FDA under an Emergency Use Authorization (EUA).  This EUA will remain in effect (meaning this test can  be used) for the duration of  the COVID-19 declaration  under Section 564(b)(1) of the Act, 21 U.S.C. section 360-bbb-3(b)(1), unless the authorization is terminated or revoked sooner.  Performed at [facility], [place] [address], [place], [place] [address]      Labs: CBC: Recent Labs  Lab 01/22/21 1132 01/23/21 0513  WBC 6.4 5.7  HGB 9.3* 9.9*  HCT 30.1* 32.2*  MCV 91.8 92.5  PLT 270 [id number]   Basic Metabolic Panel: Recent Labs  Lab 01/22/21 1132 01/23/21 0512  NA 138 133*  K 4.5 5.8*  CL 96* 95*  CO2 27 21*  GLUCOSE 120* 112*  BUN 56* 75*  CREATININE 8.70* 10.25*  CALCIUM 8.9 9.0  PHOS  --  6.3*   Liver Function Tests: Recent Labs  Lab 01/23/21 0512  ALBUMIN 2.2*   CBG: No results for input(s): GLUCAP in the last 168 hours.  Time spent: 35 minutes  Signed:  Berle Mull  [facility] 01/23/2021 1:28 PM

## 2021-01-27 ENCOUNTER — Telehealth: Payer: Self-pay

## 2021-01-27 NOTE — Telephone Encounter (Signed)
Contacted dialysis center. Pt aware fistulogram cancelled on tomorrow. Will contact office tomorrow to reschedule.

## 2021-01-27 NOTE — Telephone Encounter (Addendum)
Received notification covid test results from 01/22/21 were positive and need to reschedule fistulogram scheduled for tomorrow.   Attempted to reach pt. Left vm to return call. Will evaluate if pt symptomatic, to determine when appropriate to schedule follow-up visit with PA.   Contacted dialysis center/spoke with Alessia. Requested pt to contact office regarding change in appointment for tomorrow.

## 2021-01-28 ENCOUNTER — Encounter (HOSPITAL_COMMUNITY): Payer: Self-pay

## 2021-01-28 ENCOUNTER — Ambulatory Visit (HOSPITAL_COMMUNITY): Admit: 2021-01-28 | Payer: Medicaid Other | Admitting: Surgery

## 2021-01-28 SURGERY — A/V FISTULAGRAM
Anesthesia: LOCAL | Laterality: Right

## 2021-02-02 ENCOUNTER — Other Ambulatory Visit: Payer: Self-pay

## 2021-02-02 ENCOUNTER — Emergency Department (HOSPITAL_COMMUNITY): Payer: Medicaid Other

## 2021-02-02 ENCOUNTER — Emergency Department (HOSPITAL_COMMUNITY)
Admission: EM | Admit: 2021-02-02 | Discharge: 2021-02-03 | Disposition: A | Discharge status: Home / Self Care | Payer: Medicaid Other | Attending: Emergency Medicine | Admitting: Emergency Medicine

## 2021-02-02 DIAGNOSIS — I132 Hypertensive heart and chronic kidney disease with heart failure and with stage 5 chronic kidney disease, or end stage renal disease: Secondary | ICD-10-CM | POA: Insufficient documentation

## 2021-02-02 DIAGNOSIS — U071 COVID-19: Secondary | ICD-10-CM | POA: Diagnosis not present

## 2021-02-02 DIAGNOSIS — Z7901 Long term (current) use of anticoagulants: Secondary | ICD-10-CM | POA: Insufficient documentation

## 2021-02-02 DIAGNOSIS — T82590S Other mechanical complication of surgically created arteriovenous fistula, sequela: Secondary | ICD-10-CM | POA: Diagnosis not present

## 2021-02-02 DIAGNOSIS — E8779 Other fluid overload: Secondary | ICD-10-CM | POA: Diagnosis not present

## 2021-02-02 DIAGNOSIS — F17210 Nicotine dependence, cigarettes, uncomplicated: Secondary | ICD-10-CM | POA: Insufficient documentation

## 2021-02-02 DIAGNOSIS — E875 Hyperkalemia: Secondary | ICD-10-CM | POA: Insufficient documentation

## 2021-02-02 DIAGNOSIS — N186 End stage renal disease: Secondary | ICD-10-CM | POA: Diagnosis not present

## 2021-02-02 DIAGNOSIS — Z79899 Other long term (current) drug therapy: Secondary | ICD-10-CM | POA: Insufficient documentation

## 2021-02-02 DIAGNOSIS — R0602 Shortness of breath: Secondary | ICD-10-CM | POA: Diagnosis present

## 2021-02-02 DIAGNOSIS — Z992 Dependence on renal dialysis: Secondary | ICD-10-CM | POA: Diagnosis not present

## 2021-02-02 DIAGNOSIS — I5042 Chronic combined systolic (congestive) and diastolic (congestive) heart failure: Secondary | ICD-10-CM | POA: Diagnosis not present

## 2021-02-02 LAB — CBC
HCT: 31.3 % — ABNORMAL LOW (ref 36.0–46.0)
Hemoglobin: 9.8 g/dL — ABNORMAL LOW (ref 12.0–15.0)
MCH: 29.2 pg (ref 26.0–34.0)
MCHC: 31.3 g/dL (ref 30.0–36.0)
MCV: 93.2 fL (ref 80.0–100.0)
Platelets: 324 10*3/uL (ref 150–400)
RBC: 3.36 MIL/uL — ABNORMAL LOW (ref 3.87–5.11)
RDW: 19.7 % — ABNORMAL HIGH (ref 11.5–15.5)
WBC: 7.9 10*3/uL (ref 4.0–10.5)
nRBC: 0.5 % — ABNORMAL HIGH (ref 0.0–0.2)

## 2021-02-02 LAB — BASIC METABOLIC PANEL
Anion gap: 18 — ABNORMAL HIGH (ref 5–15)
BUN: 105 mg/dL — ABNORMAL HIGH (ref 6–20)
CO2: 22 mmol/L (ref 22–32)
Calcium: 11.2 mg/dL — ABNORMAL HIGH (ref 8.9–10.3)
Chloride: 98 mmol/L (ref 98–111)
Creatinine, Ser: 13.82 mg/dL — ABNORMAL HIGH (ref 0.44–1.00)
GFR, Estimated: 3 mL/min — ABNORMAL LOW (ref >60)
Glucose, Bld: 96 mg/dL (ref 70–99)
Potassium: 5.4 mmol/L — ABNORMAL HIGH (ref 3.5–5.1)
Sodium: 138 mmol/L (ref 135–145)

## 2021-02-02 MED ORDER — ALBUTEROL SULFATE HFA 108 (90 BASE) MCG/ACT IN AERS
2.0000 | INHALATION_SPRAY | RESPIRATORY_TRACT | Status: DC | PRN
  Administered 2021-02-03: 2 via RESPIRATORY_TRACT
  Filled 2021-02-02: qty 6.7

## 2021-02-02 NOTE — ED Triage Notes (Signed)
Pt presents to ED POV. Pt c/o SOB and pain in fistula. Pt reports that she was covid positive on 1/26. Pt reports that she also missed dialysis Friday d/t not having a ride. Pt denies increased edema.

## 2021-02-03 LAB — SARS CORONAVIRUS 2 BY RT PCR (HOSPITAL ORDER, PERFORMED IN ~~LOC~~ HOSPITAL LAB): SARS Coronavirus 2: POSITIVE — AB

## 2021-02-03 MED ORDER — DIPHENHYDRAMINE HCL 25 MG PO CAPS
ORAL_CAPSULE | ORAL | Status: AC
  Administered 2021-02-03: 25 mg via ORAL
  Filled 2021-02-03: qty 1

## 2021-02-03 MED ORDER — HYDROXYZINE HCL 25 MG PO TABS
ORAL_TABLET | ORAL | Status: AC
  Filled 2021-02-03: qty 1

## 2021-02-03 MED ORDER — HEPARIN SODIUM (PORCINE) 1000 UNIT/ML DIALYSIS: 2000.0000 [IU] | INTRAMUSCULAR | Status: DC | PRN

## 2021-02-03 MED ORDER — HYDROXYZINE HCL 10 MG PO TABS
10.0000 mg | ORAL_TABLET | Freq: Once | ORAL | Status: AC
  Administered 2021-02-03: 25 mg via ORAL
  Filled 2021-02-03: qty 1

## 2021-02-03 MED ORDER — CHLORHEXIDINE GLUCONATE CLOTH 2 % EX PADS: 6.0000 | MEDICATED_PAD | Freq: Every day | CUTANEOUS | Status: DC

## 2021-02-03 MED ORDER — SODIUM ZIRCONIUM CYCLOSILICATE 10 G PO PACK
10.0000 g | PACK | ORAL | Status: AC
  Administered 2021-02-03: 10 g via ORAL
  Filled 2021-02-03: qty 1

## 2021-02-03 MED ORDER — DIPHENHYDRAMINE HCL 25 MG PO CAPS: 25.0000 mg | ORAL_CAPSULE | Freq: Once | ORAL | Status: AC

## 2021-02-03 NOTE — ED Provider Notes (Signed)
Pt returned from dialysis.  Pt feels well and wants to go home.  Pt request food.   Pt advised to return as advised by nephrology    Fransico Meadow, PA-C 02/03/21 Erasmo Downer, MD 02/05/21 1351

## 2021-02-03 NOTE — ED Provider Notes (Signed)
[facility] EMERGENCY DEPARTMENT Provider Note   CSN: [id number] Arrival date & time: 02/02/21  2008     History Chief Complaint  Patient presents with  . Shortness of Breath    [name] is a 40 y.o. female.  Patient presents to the emergency department for evaluation of shortness of breath and right arm pain.  Patient has a history of end-stage renal disease, on dialysis Monday Wednesday Friday.  Patient missed her dialysis on Friday because of transportation issues.  Since then she has developed progressively worsening shortness of breath.  In addition she is complaining of pain in her right upper arm in the area of her fistula.  She has been having problems with her fistula for several weeks and is scheduled for fistulogram tomorrow as an outpatient.  She has a temporary dialysis catheter.        Past Medical History:  Diagnosis Date  . Anemia    low iron - receives iron at dialysis  . Anxiety   . Arthritis    RA  . Chronic systolic congestive heart failure ([place]) 03/16/2016  . Dyspnea   . ESRD (end stage renal disease) ([place])    [facility] TTHSAT _ [place]  . H/O pericarditis 01/17/2013  . H/O pleural effusion 01/17/2013  . Heart murmur   . Lupus (systemic lupus erythematosus) (HCC)    Previously followed with Dr. Charlestine Night, has not followed up recently  . 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 Pneumonia    many times  . Polysubstance abuse ([place])    cocaine, MJ, tobacco  . S/P pericardiocentesis 01/17/2013   H/o pericardial effusion with tamponade 2006   . Seizures ([place])    during pregnancy 1 time  . Streptococcal bacteremia 01/23/2013   She had two S. pneumonae bacteremia on 01/21/2013. Sensitive to Peniccilin     Patient Active Problem List   Diagnosis Date Noted  . Hemodialysis access,  fistula mature ([place]) 01/22/2021  . Allergic reaction 01/22/2021  . Acute respiratory failure with hypoxia ([place]) 11/02/2020  . Acute hyperkalemia 07/28/2020  . Nicotine dependence, cigarettes, uncomplicated [id number]  . Essential hypertension 07/28/2020  . Polysubstance abuse ([place]) 07/28/2020  . Hemodialysis catheter malfunction ([place]) 07/28/2020  . Complication of AV dialysis fistula, sequela 07/28/2020  . 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[place]) 05/18/2020  . Bacterial intestinal infection, unspecified 12/14/2019  . Pain in arm, unspecified 08/15/2019  . Cellulitis, unspecified 05/02/2019  . Hyperkalemia 07/29/2018  . SVT (supraventricular tachycardia) ([place]) 07/16/2018  . Cardiomyopathy, unspecified ([place]) 12/03/2016  . Diarrhea, unspecified 04/18/2016  . Chronic combined systolic (congestive) and diastolic (congestive) heart failure ([place]) 03/16/2016  . Other hypervolemia   . S/P thoracentesis   . Cough with hemoptysis   . Myalgia   . Pulmonary edema   . Dependence on renal dialysis ([place]) 10/15/2015  . Rash and nonspecific skin eruption 06/26/2015  . Secondary Raynaud's phenomenon 06/24/2015  . 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 Fever, unspecified 08/28/2014  . Pruritus, unspecified 08/28/2014  . Headache, unspecified 04/30/2014  . Pain, unspecified 04/30/2014  . Cramping of hands 04/19/2014  . Unspecified contraceptive management 04/19/2014  . Mild protein-calorie malnutrition ([place]) 03/22/2014  . Insomnia 03/15/2014  . Benign hypertension with ESRD (end-stage renal disease) ([place]) 03/15/2014  . Tobacco abuse 02/15/2014  . Healthcare maintenance 02/15/2014  . Iron deficiency anemia, unspecified 02/12/2014  . Coagulation  defect, unspecified ([place]) 02/10/2014  . Anemia in chronic kidney disease 02/09/2014  . Secondary hyperparathyroidism of renal origin ([place]) 02/09/2014  . Hypoalbuminemia 02/01/2014  . Nephrotic syndrome 02/01/2014  . ESRD on dialysis ([place]) 01/31/2014  . Pleural  effusion, left 01/31/2014  . Microcytic anemia 01/29/2014  . Hypocalcemia 01/29/2014  . Cocaine abuse ([place]) 01/18/2013  . Marijuana smoker ([place]) 01/18/2013  . H/O pericarditis 01/17/2013  . SLE (systemic lupus erythematosus) ([place]) 01/17/2013  . Lupus nephritis ([place]) 01/17/2013  . S/P pericardiocentesis 01/17/2013  . H/O pleural effusion 01/17/2013  . Nephrosis 01/17/2013  . Preseptal cellulitis 01/17/2013  . Cardiac tamponade 12/29/2004  . Pericardial effusion (noninflammatory) 12/29/2004    Past Surgical History:  Procedure Laterality Date  . AV FISTULA PLACEMENT    . AV FISTULA PLACEMENT Right 08/01/2020   Procedure: RIGHT ARM BRACHIOCEPHALIC  ARTERIOVENOUS (AV) FISTULA CREATION;  Surgeon: [name], MD;  Location: [place];  Service: Vascular;  Laterality: Right;  . BASCILIC VEIN TRANSPOSITION Left 02/05/2014   Procedure: [place];  Surgeon: [name], MD;  Location: [place];  Service: Vascular;  Laterality: Left;  . BASCILIC VEIN TRANSPOSITION Right 01/08/2021   Procedure: RIGHT ARM SECOND STAGE BASCILIC VEIN TRANSPOSITION;  Surgeon: [name], MD;  Location: [place];  Service: Vascular;  Laterality: Right;  . FISTULA SUPERFICIALIZATION Left 05/30/2018   Procedure: FISTULA PLICATION BASILIC VEIN TRANSPOSITION;  Surgeon: Angelia Mould, MD;  Location: [place];  Service: Vascular;  Laterality: Left;  . FISTULA SUPERFICIALIZATION Left [id number]   Procedure: PLICATION OF LEFT ARTERIOVENOUS FISTULA ULCER;  Surgeon: [name], MD;  Location: [place];  Service: Vascular;  Laterality: Left;  . INSERTION OF DIALYSIS CATHETER N/A 05/19/2020   Procedure: TUNNELED INSERTION  OF DIALYSIS CATHETER;  Surgeon: Waynetta Sandy, MD;  Location: [place];  Service: Vascular;  Laterality: N/A;  . THROMBECTOMY AND REVISION OF ARTERIOVENTOUS (AV) GORETEX  GRAFT Left 07/28/2020   Procedure: Oversewing of left arm Brachial cephalic fistula for bleeding.;  Surgeon: Angelia Mould, MD;  Location: [place];  Service: Vascular;  Laterality: Left;  Marland Kitchen VENOGRAM Right 01/31/2014   Procedure: DIALYSIS CATHETER;  Surgeon: Serafina Mitchell, MD;  Location: [facility] CATH LAB;  Service: Cardiovascular;  Laterality: Right;     OB History   No obstetric history on file.     No family history on file.  Social History   Tobacco Use  . Smoking status: Current Every Day Smoker    Packs/day: 0.12    Years: 15.00    Pack years: 1.80    Types: Cigarettes  . Smokeless tobacco: Never Used  . Tobacco comment: 4 cigarettes per day  Vaping Use  . Vaping Use: Never used  Substance Use Topics  . Alcohol use: Yes    Alcohol/week: 0.0 standard drinks    Comment: Special Occasional takes Vicar  . Drug use: Yes    Types: Marijuana    Comment: ocassional, last time- 12/21/20    Home Medications Prior to Admission medications   Medication Sig Start Date End Date Taking? Authorizing Provider  calcitRIOL (ROCALTROL) 0.5 MCG capsule Take 4 capsules (2 mcg total) by mouth Every Tuesday,Thursday,and Saturday with dialysis. Patient not taking: Reported on 01/22/2021 11/07/20   Hosie Poisson, MD  calcium acetate (PHOSLO) 667 MG capsule Take [phone] mg by mouth See admin instructions. Take 4 capsule (2668 mg) by mouth three times a day with each meal and 2 capsules (1334 mg) with  each snack    [provider]  carvedilol (COREG) 12.5 MG tablet Take 12.5 mg by mouth 2 (two) times daily with a meal.  11/07/19   [provider]  cinacalcet (SENSIPAR) 30 MG tablet Take 30 mg by mouth as needed (calcium reducer).    [provider]  diphenhydrAMINE (BENADRYL) 25 MG tablet Take 1 tablet (25 mg total) by mouth every 4 (four) hours as needed for itching or allergies. 01/23/21   Lavina Hamman, MD  doxercalciferol (HECTOROL) 4 MCG/2ML injection Use as recommended by nephrology 11/05/20   Hosie Poisson, MD  famotidine (PEPCID) 20 MG tablet Take 1 tablet (20 mg total) by  mouth daily for 10 days. 01/23/21 02/02/21  Lavina Hamman, MD  heparin 1000 unit/mL SOLN injection 1,000 Units by Dialysis route one time in dialysis.  05/21/20 06/05/21  [provider]  hydrOXYzine (ATARAX/VISTARIL) 10 MG tablet Take 1 tablet (10 mg total) by mouth 3 (three) times daily as needed for anxiety. Patient taking differently: Take 10 mg by mouth 3 (three) times daily as needed for itching or anxiety. 07/29/19   Caccavale, Sophia, PA-C  lidocaine-prilocaine (EMLA) cream Apply 1 application topically 3 (three) times a week. 11/14/20   [provider]  loperamide (IMODIUM A-D) 2 MG tablet Take 4 mg by mouth daily as needed for diarrhea or loose stools.     [provider]  Methoxy PEG-Epoetin Beta (MIRCERA IJ) Inject 1 Dose as directed every 14 (fourteen) days. 03/16/20 03/15/21  [provider]  multivitamin (RENA-VIT) TABS tablet Take 1 tablet by mouth daily.    [provider]  Nutritional Supplements (NOVASOURCE RENAL) LIQD Take 237 mLs by mouth See admin instructions. Take 1 container (237 mls) by at bedtime on dialysis days (Tuesday, Thursday, Saturday)    [provider]    Allergies    Cephalosporins and Tobramycin sulfate  Review of Systems   Review of Systems  Respiratory: Positive for shortness of breath.   Skin: Positive for wound.  All other systems reviewed and are negative.   Physical Exam Updated Vital Signs BP (!) 146/91 (BP Location: Left Arm)   Pulse 91   Temp 97.9 F (36.6 C) (Oral)   Resp (!) 28   SpO2 97%   Physical Exam Vitals and nursing note reviewed.  Constitutional:      General: She is not in acute distress.    Appearance: Normal appearance. She is well-developed and well-nourished.  HENT:     Head: Normocephalic and atraumatic.     Right Ear: Hearing normal.     Left Ear: Hearing normal.     Nose: Nose normal.     Mouth/Throat:     Mouth: Oropharynx is clear and moist and mucous membranes are  normal.  Eyes:     Extraocular Movements: EOM normal.     Conjunctiva/sclera: Conjunctivae normal.     Pupils: Pupils are equal, round, and reactive to light.  Cardiovascular:     Rate and Rhythm: Regular rhythm.     Heart sounds: S1 normal and S2 normal. No murmur heard. No friction rub. No gallop.   Pulmonary:     Effort: Pulmonary effort is normal. No respiratory distress.     Breath sounds: Normal breath sounds.  Chest:     Chest wall: No tenderness.  Abdominal:     General: Bowel sounds are normal.     Palpations: Abdomen is soft. There is no hepatosplenomegaly.     Tenderness:  There is no abdominal tenderness. There is no guarding or rebound. Negative signs include Murphy's sign and McBurney's sign.     Hernia: No hernia is present.  Musculoskeletal:        General: Normal range of motion.     Cervical back: Normal range of motion and neck supple.  Skin:    General: Skin is warm, dry and intact.     Findings: No rash.     Nails: There is no cyanosis.     Comments: Right upper arm AV fistula with mild erythema and surrounding induration, no significant active bleeding or drainage  Neurological:     Mental Status: She is alert and oriented to person, place, and time.     GCS: GCS eye subscore is 4. GCS verbal subscore is 5. GCS motor subscore is 6.     Cranial Nerves: No cranial nerve deficit.     Sensory: No sensory deficit.     Coordination: Coordination normal.     Deep Tendon Reflexes: Strength normal.  Psychiatric:        Mood and Affect: Mood and affect normal.        Speech: Speech normal.        Behavior: Behavior normal.        Thought Content: Thought content normal.     ED Results / Procedures / Treatments   Labs (all labs ordered are listed, but only abnormal results are displayed) Labs Reviewed  CBC - Abnormal; Notable for the following components:      Result Value   RBC 3.36 (*)    Hemoglobin 9.8 (*)    HCT 31.3 (*)    RDW 19.7 (*)    nRBC 0.5  (*)    All other components within normal limits  BASIC METABOLIC PANEL - Abnormal; Notable for the following components:   Potassium 5.4 (*)    BUN 105 (*)    Creatinine, Ser 13.82 (*)    Calcium 11.2 (*)    GFR, Estimated 3 (*)    Anion gap 18 (*)    All other components within normal limits    EKG EKG Interpretation  Date/Time:  Sunday February 02 2021 20:41:36 EST Ventricular Rate:  95 PR Interval:  280 QRS Duration: 88 QT Interval:  328 QTC Calculation: 412 R Axis:   75 Text Interpretation: Sinus rhythm with 1st degree A-V block Possible Left atrial enlargement Borderline ECG When compared with ECG of 01/22/2021, REPOLARIZATION ABNORMALITY has improved Confirmed by Delora Fuel ([id number]) on 02/02/2021 11:25:40 PM   Radiology DG Chest Portable 1 View  Result Date: 02/02/2021 CLINICAL DATA:  Shortness of breath EXAM: PORTABLE CHEST 1 VIEW COMPARISON:  November 03, 2020 FINDINGS: There is a well-positioned left-sided dialysis catheter in place. There is cardiomegaly with vascular congestion in mild interstitial edema. There are small bilateral pleural effusions which have increased in size from the prior study. There is no pneumothorax. IMPRESSION: Cardiomegaly with findings of CHF. There are small bilateral pleural effusions, increased in size from the prior study. Electronically Signed   By: Constance Holster M.D.   On: 02/02/2021 21:00    Procedures Procedures   Medications Ordered in ED Medications  albuterol (VENTOLIN HFA) 108 (90 Base) MCG/ACT inhaler 2 puff (has no administration in time range)    ED Course  I have reviewed the triage vital signs and the nursing notes.  Pertinent labs & imaging results that were available during my care of the patient were reviewed by  me and considered in my medical decision making (see chart for details).    MDM Rules/Calculators/A&P                          Patient presents for shortness of breath and right arm pain.  Patient is  experiencing shortness of breath secondary to missing her dialysis on Friday.  She does have volume overload seen on x-ray.  She is doing well on nasal cannula oxygen.  She does have evidence of mild uremia and slight hyperkalemia without EKG changes.  She is due for dialysis today.  Patient complaining of persistent right arm pain.  She was hospitalized on January 26 with similar symptoms.  She could not get a fistulogram performed at that time because she was not assigned a room and was discharged to have this performed as an outpatient.  She has had ongoing issues of swelling, bleeding and some drainage from surgical incision sites.  She has been on and off antibiotics for this.  Records from her last visit reveal that vascular surgery suspects that her pain and swelling is secondary to malfunction of fistula, not infection.  Discussed with Dr. Augustin Coupe, on-call for nephrology.  Does not feel that the patient can be dialyzed today because of volume.  Recommends rechecking Covid PCR, if negative she can go back to regularly scheduled dialysis.  Will have renal navigator help with scheduling and transportation for dialysis today.   Final Clinical Impression(s) / ED Diagnoses Final diagnoses:  ESRD (end stage renal disease) ([place])  Other hypervolemia  Dialysis AV fistula malfunction, sequela    Rx / DC Orders ED Discharge Orders    None       Orpah Greek, MD 02/03/21 [phone]

## 2021-02-03 NOTE — Progress Notes (Signed)
Hemodialysis- Completed without issue. 3L removed as ordered. Patient states feeling a lot better. Sats 96% on room air currently while awaiting transport back to ED. No other complaints. Reported off to ED RN.

## 2021-02-03 NOTE — ED Notes (Signed)
Pt states "I feel short of breath" O2 sats'@100'$ %. Pt used Ventolin.

## 2021-02-03 NOTE — ED Provider Notes (Signed)
Signout note  40 year old lady with concern for shortness of breath.  History of ESRD, missed dialysis on Friday.  Recent Covid positive test.  CXR with likely fluid overload state.  Suspected secondary to missed dialysis.  On small amount of supplemental nasal cannula.    7:30 AM Received signout from [place], discuss with renal navigator, check covid  10:00 AM Covid is still positive. Discussed with renal navigator, pt not on O2 at home, if ok to dc then could get dialysis late afternoon; I reassessed patient, she still was on O2 and still felt SOB. I feel safest option for patient would be to get dialysis in hospital, then hopefully go home; discussed with [place] who said he will help arrange for dialysis this afternoon. Once patient has completed dialysis, if her symptoms have improved and has no O2 requirement, then she can hopefully be discharged this evening.    Lucrezia Starch, MD 02/03/21 1410

## 2021-02-03 NOTE — ED Notes (Signed)
Pt sitting up in bed drinking her ginger ale. Denies any complaints at this time.

## 2021-02-03 NOTE — Procedures (Signed)
40 yo w/ hx SLE, seizures, chronic sCHF and ESRD on HD presented to ED w/ SOB and recent +COVID test. Pt missed HD and CXR c/w IS edema. No fevers. ED MD asked [name] to see for HD. Plan is for HD upstairs and then return to ED for reassessment prior to dc home.  Pt seen on HD, breathing better since starting dialysis. Almost 3 L off. No distress, nasal O2. Plans as above.     I was present at this dialysis session, have reviewed the session itself and made  appropriate changes Kelly Splinter MD [place] pager [phone]   02/03/2021, 6:24 PM

## 2021-02-03 NOTE — ED Notes (Signed)
Left for dialysis 1601

## 2021-02-03 NOTE — Discharge Instructions (Signed)
Return as needed

## 2021-02-03 NOTE — Progress Notes (Addendum)
Renal Navigator received messages from Dr. Buckner Malta. [name] regarding patient in ED who missed HD Friday due to transportation issues. Navigator contacted patient's mother/[name] at [phone], who Navigator spoke with when patient first tested positive for COVID. Patient's mother transports patient to HD regardless of her COVID status. She confirmed again to Navigator today that she is able to pick patient up from ED when called and take her to her [facility] clinic[facility] today for HD at 4:15pm. This should be patient's last day in isolation as long as she is not febrile by 02/05/21 (14th day from positive test). She can do Monday, Thursday, Saturday next week as long as she is afebrile and not having an increase in respiratory symptoms. She can speak with her clinic with any questions. She will be required to wear an N95 mask for the remaining 7 days on treatment until a full 21 days from positive test have passed.  No barriers to ED discharge today from a renal standpoint. Patient will not receive HD in the hospital today as long as she is being discharged. Please re-consult Renal team if there is a change to that plan. Plan changed for patient to have HD in the hospital and reassess O2 need after. Patient's mother and [place] clinic updated.   Alphonzo Cruise, [place] Renal Navigator [phone]

## 2021-02-03 NOTE — ED Notes (Signed)
K 5.4 Breakfast order placed

## 2021-02-04 ENCOUNTER — Ambulatory Visit (HOSPITAL_COMMUNITY): Admission: RE | Admit: 2021-02-04 | Payer: Medicaid Other | Source: Home / Self Care | Admitting: Surgery

## 2021-02-04 ENCOUNTER — Encounter (HOSPITAL_COMMUNITY): Admission: RE | Payer: Self-pay | Source: Home / Self Care

## 2021-02-04 ENCOUNTER — Other Ambulatory Visit: Payer: Self-pay

## 2021-02-04 ENCOUNTER — Telehealth: Payer: Self-pay

## 2021-02-04 SURGERY — A/V FISTULAGRAM
Anesthesia: LOCAL

## 2021-02-04 NOTE — Progress Notes (Signed)
Procedure orders already placed on 01/23/21 and are in signed/held status.

## 2021-02-04 NOTE — Telephone Encounter (Signed)
Pt did not stay for her PV lab procedure today and has been r/s for 02/18/21. She wanted to wait until end of month. She is aware of her arrival time and instructions. No concerns/questions at this time.

## 2021-02-18 ENCOUNTER — Other Ambulatory Visit: Payer: Self-pay

## 2021-02-18 ENCOUNTER — Encounter (HOSPITAL_COMMUNITY)
Admission: RE | Disposition: A | Discharge status: Home / Self Care | Payer: Self-pay | Source: Home / Self Care | Attending: Surgery

## 2021-02-18 ENCOUNTER — Ambulatory Visit (HOSPITAL_COMMUNITY): Payer: Medicaid Other | Admitting: Certified Registered Nurse Anesthetist

## 2021-02-18 ENCOUNTER — Encounter (HOSPITAL_COMMUNITY): Payer: Self-pay | Admitting: Surgery

## 2021-02-18 ENCOUNTER — Ambulatory Visit (HOSPITAL_COMMUNITY): Payer: Medicaid Other

## 2021-02-18 ENCOUNTER — Other Ambulatory Visit (HOSPITAL_COMMUNITY): Payer: Self-pay | Admitting: Physician Assistant

## 2021-02-18 ENCOUNTER — Ambulatory Visit (HOSPITAL_COMMUNITY)
Admission: RE | Admit: 2021-02-18 | Discharge: 2021-02-18 | Disposition: A | Discharge status: Home / Self Care | Payer: Medicaid Other | Attending: Surgery | Admitting: Surgery

## 2021-02-18 DIAGNOSIS — Y841 Kidney dialysis as the cause of abnormal reaction of the patient, or of later complication, without mention of misadventure at the time of the procedure: Secondary | ICD-10-CM | POA: Insufficient documentation

## 2021-02-18 DIAGNOSIS — T82858A Stenosis of vascular prosthetic devices, implants and grafts, initial encounter: Secondary | ICD-10-CM | POA: Insufficient documentation

## 2021-02-18 DIAGNOSIS — L02413 Cutaneous abscess of right upper limb: Secondary | ICD-10-CM | POA: Insufficient documentation

## 2021-02-18 DIAGNOSIS — M7989 Other specified soft tissue disorders: Secondary | ICD-10-CM | POA: Insufficient documentation

## 2021-02-18 DIAGNOSIS — N186 End stage renal disease: Secondary | ICD-10-CM | POA: Diagnosis not present

## 2021-02-18 DIAGNOSIS — Z8616 Personal history of COVID-19: Secondary | ICD-10-CM | POA: Diagnosis not present

## 2021-02-18 DIAGNOSIS — Z79899 Other long term (current) drug therapy: Secondary | ICD-10-CM | POA: Insufficient documentation

## 2021-02-18 DIAGNOSIS — F17210 Nicotine dependence, cigarettes, uncomplicated: Secondary | ICD-10-CM | POA: Diagnosis not present

## 2021-02-18 DIAGNOSIS — Z992 Dependence on renal dialysis: Secondary | ICD-10-CM | POA: Diagnosis not present

## 2021-02-18 DIAGNOSIS — T82898A Other specified complication of vascular prosthetic devices, implants and grafts, initial encounter: Secondary | ICD-10-CM | POA: Diagnosis not present

## 2021-02-18 HISTORY — PX: I & D EXTREMITY: SHX5045

## 2021-02-18 LAB — POCT I-STAT, CHEM 8
BUN: 57 mg/dL — ABNORMAL HIGH (ref 6–20)
Calcium, Ion: 1.16 mmol/L (ref 1.15–1.40)
Chloride: 101 mmol/L (ref 98–111)
Creatinine, Ser: 12.1 mg/dL — ABNORMAL HIGH (ref 0.44–1.00)
Glucose, Bld: 69 mg/dL — ABNORMAL LOW (ref 70–99)
HCT: 35 % — ABNORMAL LOW (ref 36.0–46.0)
Hemoglobin: 11.9 g/dL — ABNORMAL LOW (ref 12.0–15.0)
Potassium: 5 mmol/L (ref 3.5–5.1)
Sodium: 135 mmol/L (ref 135–145)
TCO2: 26 mmol/L (ref 22–32)

## 2021-02-18 LAB — HCG, SERUM, QUALITATIVE: Preg, Serum: NEGATIVE

## 2021-02-18 SURGERY — IRRIGATION AND DEBRIDEMENT EXTREMITY
Anesthesia: General | Laterality: Right

## 2021-02-18 SURGERY — INVASIVE LAB ABORTED CASE

## 2021-02-18 MED ORDER — SODIUM CHLORIDE 0.9 % IV SOLN
INTRAVENOUS | Status: DC | PRN
  Administered 2021-02-18: 500 mL

## 2021-02-18 MED ORDER — PHENYLEPHRINE 40 MCG/ML (10ML) SYRINGE FOR IV PUSH (FOR BLOOD PRESSURE SUPPORT)
PREFILLED_SYRINGE | INTRAVENOUS | Status: DC | PRN
  Administered 2021-02-18: 80 ug via INTRAVENOUS

## 2021-02-18 MED ORDER — FENTANYL CITRATE (PF) 100 MCG/2ML IJ SOLN: 25.0000 ug | INTRAMUSCULAR | Status: DC | PRN

## 2021-02-18 MED ORDER — OXYCODONE HCL 5 MG PO TABS
5.0000 mg | ORAL_TABLET | Freq: Once | ORAL | Status: AC | PRN
  Administered 2021-02-18: 5 mg via ORAL

## 2021-02-18 MED ORDER — FENTANYL CITRATE (PF) 250 MCG/5ML IJ SOLN
INTRAMUSCULAR | Status: AC
  Filled 2021-02-18: qty 5

## 2021-02-18 MED ORDER — FENTANYL CITRATE (PF) 250 MCG/5ML IJ SOLN
INTRAMUSCULAR | Status: DC | PRN
  Administered 2021-02-18 (×3): 25 ug via INTRAVENOUS

## 2021-02-18 MED ORDER — MIDAZOLAM HCL 2 MG/2ML IJ SOLN
INTRAMUSCULAR | Status: DC | PRN
  Administered 2021-02-18: 2 mg via INTRAVENOUS

## 2021-02-18 MED ORDER — PHENYLEPHRINE HCL-NACL 10-0.9 MG/250ML-% IV SOLN
INTRAVENOUS | Status: DC | PRN
  Administered 2021-02-18: 20 ug/min via INTRAVENOUS

## 2021-02-18 MED ORDER — PROPOFOL 10 MG/ML IV BOLUS
INTRAVENOUS | Status: AC
  Filled 2021-02-18: qty 20

## 2021-02-18 MED ORDER — SODIUM CHLORIDE 0.9 % IV SOLN
INTRAVENOUS | Status: AC
  Filled 2021-02-18: qty 1.2

## 2021-02-18 MED ORDER — LIDOCAINE 2% (20 MG/ML) 5 ML SYRINGE
INTRAMUSCULAR | Status: AC
  Filled 2021-02-18: qty 5

## 2021-02-18 MED ORDER — CHLORHEXIDINE GLUCONATE 0.12 % MT SOLN: 15.0000 mL | Freq: Once | OROMUCOSAL | Status: AC

## 2021-02-18 MED ORDER — MIDAZOLAM HCL 2 MG/2ML IJ SOLN
INTRAMUSCULAR | Status: AC
  Filled 2021-02-18: qty 2

## 2021-02-18 MED ORDER — EPHEDRINE SULFATE-NACL 50-0.9 MG/10ML-% IV SOSY
PREFILLED_SYRINGE | INTRAVENOUS | Status: DC | PRN
  Administered 2021-02-18: 10 mg via INTRAVENOUS

## 2021-02-18 MED ORDER — VANCOMYCIN HCL 1000 MG IV SOLR
INTRAVENOUS | Status: DC | PRN
  Administered 2021-02-18: 1000 mg via INTRAVENOUS

## 2021-02-18 MED ORDER — DEXAMETHASONE SODIUM PHOSPHATE 10 MG/ML IJ SOLN
INTRAMUSCULAR | Status: DC | PRN
  Administered 2021-02-18: 5 mg via INTRAVENOUS

## 2021-02-18 MED ORDER — CHLORHEXIDINE GLUCONATE 0.12 % MT SOLN
OROMUCOSAL | Status: AC
  Administered 2021-02-18: 15 mL via OROMUCOSAL
  Filled 2021-02-18: qty 15

## 2021-02-18 MED ORDER — VANCOMYCIN HCL 1000 MG/200ML IV SOLN
1000.0000 mg | INTRAVENOUS | Status: DC
  Filled 2021-02-18: qty 200

## 2021-02-18 MED ORDER — OXYCODONE HCL 5 MG/5ML PO SOLN: 5.0000 mg | Freq: Once | ORAL | Status: AC | PRN

## 2021-02-18 MED ORDER — OXYCODONE-ACETAMINOPHEN 5-325 MG PO TABS: 1.0000 | ORAL_TABLET | Freq: Four times a day (QID) | ORAL | 0 refills | Status: DC | PRN

## 2021-02-18 MED ORDER — EPHEDRINE 5 MG/ML INJ
INTRAVENOUS | Status: AC
  Filled 2021-02-18: qty 10

## 2021-02-18 MED ORDER — PHENYLEPHRINE 40 MCG/ML (10ML) SYRINGE FOR IV PUSH (FOR BLOOD PRESSURE SUPPORT)
PREFILLED_SYRINGE | INTRAVENOUS | Status: AC
  Filled 2021-02-18: qty 10

## 2021-02-18 MED ORDER — ONDANSETRON HCL 4 MG/2ML IJ SOLN
INTRAMUSCULAR | Status: AC
  Filled 2021-02-18: qty 2

## 2021-02-18 MED ORDER — OXYCODONE HCL 5 MG PO TABS
ORAL_TABLET | ORAL | Status: AC
  Filled 2021-02-18: qty 1

## 2021-02-18 MED ORDER — IODIXANOL 320 MG/ML IV SOLN
INTRAVENOUS | Status: DC | PRN
Start: 2021-02-18 — End: 2021-02-18
  Administered 2021-02-18: 50 mL

## 2021-02-18 MED ORDER — SODIUM CHLORIDE 0.9 % IV SOLN: INTRAVENOUS | Status: DC

## 2021-02-18 MED ORDER — HEPARIN SODIUM (PORCINE) 1000 UNIT/ML IJ SOLN
INTRAMUSCULAR | Status: DC | PRN
  Administered 2021-02-18: 2000 [IU] via INTRAVENOUS

## 2021-02-18 MED ORDER — ONDANSETRON HCL 4 MG/2ML IJ SOLN: 4.0000 mg | Freq: Four times a day (QID) | INTRAMUSCULAR | Status: DC | PRN

## 2021-02-18 MED ORDER — DEXAMETHASONE SODIUM PHOSPHATE 10 MG/ML IJ SOLN
INTRAMUSCULAR | Status: AC
  Filled 2021-02-18: qty 1

## 2021-02-18 MED ORDER — LIDOCAINE 2% (20 MG/ML) 5 ML SYRINGE
INTRAMUSCULAR | Status: DC | PRN
  Administered 2021-02-18: 60 mg via INTRAVENOUS

## 2021-02-18 MED ORDER — PROPOFOL 10 MG/ML IV BOLUS
INTRAVENOUS | Status: DC | PRN
  Administered 2021-02-18: 50 mg via INTRAVENOUS
  Administered 2021-02-18: 30 mg via INTRAVENOUS
  Administered 2021-02-18: 150 mg via INTRAVENOUS

## 2021-02-18 MED ORDER — ONDANSETRON HCL 4 MG/2ML IJ SOLN
INTRAMUSCULAR | Status: DC | PRN
  Administered 2021-02-18: 4 mg via INTRAVENOUS

## 2021-02-18 MED ORDER — ACETAMINOPHEN 500 MG PO TABS
1000.0000 mg | ORAL_TABLET | Freq: Once | ORAL | Status: AC
  Administered 2021-02-18: 1000 mg via ORAL
  Filled 2021-02-18: qty 2

## 2021-02-18 MED ORDER — 0.9 % SODIUM CHLORIDE (POUR BTL) OPTIME
TOPICAL | Status: DC | PRN
  Administered 2021-02-18: 1000 mL

## 2021-02-18 MED FILL — OXYCODONE-APAP 5-325MG: 5-325 | 5 days supply | Qty: 20 | Fill #0

## 2021-02-18 SURGICAL SUPPLY — 44 items
BALLN MUSTANG 8X20X75 (BALLOONS) ×2
BALLN MUSTANG 8X60X75 (BALLOONS) ×2
BALLOON MUSTANG 8X20X75 (BALLOONS) IMPLANT
BALLOON MUSTANG 8X60X75 (BALLOONS) IMPLANT
BNDG ELASTIC 4X5.8 VLCR STR LF (GAUZE/BANDAGES/DRESSINGS) ×1 IMPLANT
BNDG ELASTIC 6X5.8 VLCR STR LF (GAUZE/BANDAGES/DRESSINGS) IMPLANT
BNDG GAUZE ELAST 4 BULKY (GAUZE/BANDAGES/DRESSINGS) ×1 IMPLANT
CANISTER SUCT 3000ML PPV (MISCELLANEOUS) ×2 IMPLANT
CLIP VESOCCLUDE MED 6/CT (CLIP) ×1 IMPLANT
CLIP VESOCCLUDE SM WIDE 6/CT (CLIP) ×1 IMPLANT
COVER DOME SNAP 22 D (MISCELLANEOUS) ×1 IMPLANT
COVER PROBE W GEL 5X96 (DRAPES) ×1 IMPLANT
COVER SURGICAL LIGHT HANDLE (MISCELLANEOUS) ×2 IMPLANT
COVER WAND RF STERILE (DRAPES) ×2 IMPLANT
DRAPE HALF SHEET 40X57 (DRAPES) ×2 IMPLANT
DRAPE INCISE IOBAN 66X45 STRL (DRAPES) ×2 IMPLANT
DRAPE ORTHO SPLIT 77X108 STRL (DRAPES) ×4
DRAPE SURG ORHT 6 SPLT 77X108 (DRAPES) ×2 IMPLANT
DRSG TEGADERM 4X4.75 (GAUZE/BANDAGES/DRESSINGS) ×1 IMPLANT
ELECT REM PT RETURN 9FT ADLT (ELECTROSURGICAL) ×2
ELECTRODE REM PT RTRN 9FT ADLT (ELECTROSURGICAL) ×1 IMPLANT
GAUZE SPONGE 4X4 12PLY STRL (GAUZE/BANDAGES/DRESSINGS) ×2 IMPLANT
GLOVE BIO SURGEON STRL SZ7.5 (GLOVE) ×2 IMPLANT
GLOVE SRG 8 PF TXTR STRL LF DI (GLOVE) ×1 IMPLANT
GLOVE SURG UNDER POLY LF SZ8 (GLOVE) ×2
GOWN STRL REUS W/ TWL LRG LVL3 (GOWN DISPOSABLE) ×3 IMPLANT
GOWN STRL REUS W/TWL LRG LVL3 (GOWN DISPOSABLE) ×6
KIT BASIN OR (CUSTOM PROCEDURE TRAY) ×2 IMPLANT
KIT ENCORE 26 ADVANTAGE (KITS) ×1 IMPLANT
KIT MICROPUNCTURE NIT STIFF (SHEATH) ×1 IMPLANT
KIT TURNOVER KIT B (KITS) ×2 IMPLANT
NS IRRIG 1000ML POUR BTL (IV SOLUTION) ×2 IMPLANT
PACK GENERAL/GYN (CUSTOM PROCEDURE TRAY) IMPLANT
PAD ARMBOARD 7.5X6 YLW CONV (MISCELLANEOUS) ×4 IMPLANT
SHEATH PINNACLE R/O II 6F 4CM (SHEATH) ×1 IMPLANT
STOPCOCK MORSE 400PSI 3WAY (MISCELLANEOUS) ×1 IMPLANT
SUT ETHILON 3 0 PS 1 (SUTURE) ×1 IMPLANT
SUT VIC AB 2-0 CTB1 (SUTURE) IMPLANT
SUT VIC AB 3-0 SH 27 (SUTURE)
SUT VIC AB 3-0 SH 27X BRD (SUTURE) IMPLANT
SUT VICRYL 4-0 PS2 18IN ABS (SUTURE) IMPLANT
TOWEL GREEN STERILE (TOWEL DISPOSABLE) ×2 IMPLANT
WATER STERILE IRR 1000ML POUR (IV SOLUTION) ×2 IMPLANT
WIRE STARTER BENTSON 035X150 (WIRE) ×1 IMPLANT

## 2021-02-18 SURGICAL SUPPLY — 9 items
BAG SNAP BAND KOVER 36X36 (MISCELLANEOUS) ×1 IMPLANT
COVER DOME SNAP 22 D (MISCELLANEOUS) ×1 IMPLANT
KIT MICROPUNCTURE NIT STIFF (SHEATH) IMPLANT
PROTECTION STATION PRESSURIZED (MISCELLANEOUS)
SHEATH PROBE COVER 6X72 (BAG) ×1 IMPLANT
STATION PROTECTION PRESSURIZED (MISCELLANEOUS) ×1 IMPLANT
STOPCOCK MORSE 400PSI 3WAY (MISCELLANEOUS) ×1 IMPLANT
TRAY PV CATH (CUSTOM PROCEDURE TRAY) ×1 IMPLANT
TUBING CIL FLEX 10 FLL-RA (TUBING) ×1 IMPLANT

## 2021-02-18 NOTE — Transfer of Care (Signed)
Immediate Anesthesia Transfer of Care Note  Patient: [name]  Procedure(s) Performed: IRRIGATION AND DEBRIDEMENT OF ARM (Right )  Patient Location: PACU  Anesthesia Type:General  Level of Consciousness: drowsy  Airway & Oxygen Therapy: Patient Spontanous Breathing and Patient connected to face mask oxygen  Post-op Assessment: Report given to RN and Post -op Vital signs reviewed and stable  Post vital signs: Reviewed and stable  Last Vitals:  Vitals Value Taken Time  BP 132/87 02/18/21 1416  Temp    Pulse 73 02/18/21 1416  Resp 24 02/18/21 1416  SpO2 98 % 02/18/21 1416  Vitals shown include unvalidated device data.  Last Pain:  Vitals:   02/18/21 0914  TempSrc:   PainSc: 0-No pain      Patients Stated Pain Goal: 2 ([id number])  Complications: No complications documented.

## 2021-02-18 NOTE — Anesthesia Procedure Notes (Signed)
Procedure Name: LMA Insertion Date/Time: 02/18/2021 12:33 PM Performed by: Dorthea Cove, CRNA Pre-anesthesia Checklist: Patient identified, Emergency Drugs available, Suction available and Patient being monitored Patient Re-evaluated:Patient Re-evaluated prior to induction Oxygen Delivery Method: Circle System Utilized Preoxygenation: Pre-oxygenation with 100% oxygen Induction Type: IV induction Ventilation: Mask ventilation without difficulty LMA: LMA inserted LMA Size: 4.0 Number of attempts: 1 Airway Equipment and Method: Bite block Placement Confirmation: positive ETCO2 and CO2 detector Tube secured with: Tape Dental Injury: Teeth and Oropharynx as per pre-operative assessment

## 2021-02-18 NOTE — H&P (Signed)
   Patient name: [name] MRN: [id number] DOB: 1981/09/28 Sex: female    HISTORY OF PRESENT ILLNESS:   [name] is a 40 y.o. female who is status post second stage right basilic vein fistula creation by Dr. Donnetta Hutching in January 2022.  She presented to the emergency department shortly after her procedure with significant swelling to her right arm.  We had scheduled her to do a fistulogram in the Cath Lab however she was newly diagnosed with Covid.  For that reason, she has been delayed several weeks.  She came back today for her fistulogram but complained of pus draining from her arm  CURRENT MEDICATIONS:    No current facility-administered medications for this encounter.    REVIEW OF SYSTEMS:   '[X]'$  denotes positive finding, '[ ]'$  denotes negative finding Cardiac  Comments:  Chest pain or chest pressure:    Shortness of breath upon exertion:    Short of breath when lying flat:    Irregular heart rhythm:    Constitutional    Fever or chills:      PHYSICAL EXAM:   Vitals:   02/18/21 0855  BP: (!) 139/99  Pulse: 94  Temp: 97.9 F (36.6 C)  TempSrc: Oral  SpO2: 100%  Weight: 47.5 kg  Height: '5\' 3"'$  (1.6 m)    GENERAL: The patient is a well-nourished female, in no acute distress. The vital signs are documented above. CARDIOVASCULAR: There is a regular rate and rhythm. PULMONARY: Non-labored respirations Palpable thrill within fistula.  In her upper incision there is purulent drainage  STUDIES:   None   MEDICAL ISSUES:   I discussed with the patient that the best course of action will be to go to the operating room to open up her incision and wash this out.  In the operating room, we can proceed with fistulogram and intervention as indicated.  She is willing to proceed.  Leia Alf, MD, FACS [facility] [phone] Pager [phone]

## 2021-02-18 NOTE — Op Note (Signed)
NAME: [name]    MRN: [id number] DOB: 1981/11/22    DATE OF OPERATION: 02/18/2021  PREOP DIAGNOSIS:    Swollen right arm with abscess right upper arm fistula  POSTOP DIAGNOSIS:    Same  Occluded SVC   PROCEDURE:    1.  Incision and drainage of abscess right upper arm fistula 2.  Fistulogram right basilic vein transposition 3.  Angioplasty of SVC stenosis (8 mm x 6 cm balloon) 4.  Angioplasty of right basilic vein stenosis (8 mm x 2 cm balloon) 5.  Ultrasound guided access to the right basilic vein transposition  SURGEON: Judeth Cornfield. Scot Dock, MD  ASSIST: None  ANESTHESIA: General  EBL: Minimal  INDICATIONS:    [name] is a 40 y.o. female who had a 2nd stage basilic vein transposition on 01/08/2021 by Dr. Donnetta Hutching.  She was having problems with persistent arm swelling and had been set up for a fistulogram today.  However she was found at the time of the procedure to have an abscess in her left upper arm and therefore she was canceled and sent to the operating room for I&D of the abscess and possible fistulogram  FINDINGS:   The SVC was occluded.  This was successfully ballooned with an 8 mm balloon.  There was also a narrowing in the basilic vein which was dilated with an 8 mm balloon.  TECHNIQUE:   The patient was taken to the operating room and the right upper extremity was prepped and draped in usual sterile fashion after she received a general anesthetic.  The incision where there was drainage was opened longitudinally with a scalpel and the dissection carried down to the area of infection.  Intraoperative culture was obtained.  There was another area of fluctuance adjacent to the vein and I am made a small incision over this area and was able to then connect the 2 areas.  Some purulent drainage was identified.  The wound was irrigated and hemostasis obtained.  The small incision adjacent to the vein was closed with interrupted 3-0 nylon's.  This did not  involve the fistula.  Next, the proximal fistula was cannulated under ultrasound guidance with a micropuncture needle and a micropuncture sheath introduced over the wire.  A fistulogram was then obtained to evaluate the central veins and there was an occlusion of the SVC.  I then advanced a Bentson wire through the micropuncture sheath and exchanged this for a short 6 [place] sheath.  Using the Kumpe catheter and Bentson wire was able to traverse the occlusion in the SVC.  Patient received 2000 units of IV heparin.  I selected a 8 mm x 6 cm balloon which was positioned across the occlusion and inflated to 20 atm for 1 minute.  Follow-up film showed approximately 50% residual stenosis.  The balloon size itself looked to be appropriately sized.  I did do a 2nd inflation to 20 atm for 1 minute and follow-up film showed some improvement with a less than 50% residual stenosis.  I then evaluated the rest of the fistula and there was some narrowing in one area and this was ballooned with an 8 mm x 2 cm balloon with a good result.  A retrograde shot with the balloon inflated did not show any's problems with the proximal fistula or proximal anastomosis.  A 4-0 Monocryl was placed around the cannulation site.  The wound in the upper arm was packed with a 4 x 4 and then the wound  was covered with dry 4 x 4's Kerlix and a 4 inch Ace.  The patient tolerated procedure well was transferred to the recovery room in stable condition.  All needle and sponge counts were correct.  Deitra Mayo, MD, FACS [facility]  DATE OF DICTATION:   02/18/2021

## 2021-02-18 NOTE — Anesthesia Preprocedure Evaluation (Signed)
Anesthesia Evaluation  Patient identified by MRN, date of birth, ID band Patient awake    Reviewed: Allergy & Precautions, H&P , NPO status , Patient's Chart, lab work & pertinent test results  Airway Mallampati: II   Neck ROM: full    Dental   Pulmonary shortness of breath, Current Smoker,    breath sounds clear to auscultation       Cardiovascular hypertension, +CHF   Rhythm:regular Rate:Normal     Neuro/Psych  Headaches, Seizures -,     GI/Hepatic   Endo/Other    Renal/GU ESRFRenal diseaselupus     Musculoskeletal  (+) Arthritis ,   Abdominal   Peds  Hematology   Anesthesia Other Findings   Reproductive/Obstetrics                             Anesthesia Physical Anesthesia Plan  ASA: III  Anesthesia Plan: General   Post-op Pain Management:    Induction: Intravenous  PONV Risk Score and Plan: 2 and Ondansetron, Dexamethasone, Midazolam and Treatment may vary due to age or medical condition  Airway Management Planned: LMA  Additional Equipment:   Intra-op Plan:   Post-operative Plan: Extubation in OR  Informed Consent: I have reviewed the patients History and Physical, chart, labs and discussed the procedure including the risks, benefits and alternatives for the proposed anesthesia with the patient or authorized representative who has indicated his/her understanding and acceptance.     Dental advisory given  Plan Discussed with: CRNA, Anesthesiologist and Surgeon  Anesthesia Plan Comments:         Anesthesia Quick Evaluation

## 2021-02-18 NOTE — Discharge Instructions (Signed)
Right arm wet to dry dressing changes twice daily 4 x 4 Kerlex, light ace

## 2021-02-18 NOTE — Interval H&P Note (Signed)
History and Physical Interval Note:  02/18/2021 10:24 AM  [name]  has presented today for surgery, with the diagnosis of instage renal.  The various methods of treatment have been discussed with the patient and family. After consideration of risks, benefits and other options for treatment, the patient has consented to  Procedure(s): A/V FISTULAGRAM (N/A) as a surgical intervention.  The patient's history has been reviewed, patient examined, no change in status, stable for surgery.  I have reviewed the patient's chart and labs.  Questions were answered to the patient's satisfaction.     Annamarie Major

## 2021-02-18 NOTE — Interval H&P Note (Signed)
History and Physical Interval Note:  02/18/2021 11:31 AM  [name]  has presented today for surgery, with the diagnosis of instage renal.  The various methods of treatment have been discussed with the patient and family. After consideration of risks, benefits and other options for treatment, the patient has consented to  Procedure(s): IRRIGATION AND DEBRIDEMENT OF ARM (Right) as a surgical intervention.  The patient's history has been reviewed, patient examined, no change in status, stable for surgery.  I have reviewed the patient's chart and labs.  Questions were answered to the patient's satisfaction.     Deitra Mayo

## 2021-02-19 ENCOUNTER — Encounter (HOSPITAL_COMMUNITY): Payer: Self-pay | Admitting: Vascular Surgery

## 2021-02-19 NOTE — Anesthesia Postprocedure Evaluation (Signed)
Anesthesia Post Note  Patient: [name]  Procedure(s) Performed: IRRIGATION AND DEBRIDEMENT OF ARM (Right )     Patient location during evaluation: PACU Anesthesia Type: General Level of consciousness: awake and alert Pain management: pain level controlled Vital Signs Assessment: post-procedure vital signs reviewed and stable Respiratory status: spontaneous breathing, nonlabored ventilation, respiratory function stable and patient connected to nasal cannula oxygen Cardiovascular status: blood pressure returned to baseline and stable Postop Assessment: no apparent nausea or vomiting Anesthetic complications: no   No complications documented.  Last Vitals:  Vitals:   02/18/21 1445 02/18/21 1449  BP: 131/88   Pulse:    Resp: (!) 22 20  Temp:    SpO2:      Last Pain:  Vitals:   02/18/21 1449  TempSrc:   PainSc: 4                  [name]

## 2021-02-20 ENCOUNTER — Telehealth: Payer: Self-pay

## 2021-02-20 NOTE — Telephone Encounter (Signed)
Left pt voicemail to call us back if she still needs assistance.

## 2021-02-23 LAB — AEROBIC/ANAEROBIC CULTURE W GRAM STAIN (SURGICAL/DEEP WOUND)

## 2021-02-24 LAB — POCT I-STAT, CHEM 8
BUN: 86 mg/dL — ABNORMAL HIGH (ref 6–20)
Calcium, Ion: 1.06 mmol/L — ABNORMAL LOW (ref 1.15–1.40)
Chloride: 101 mmol/L (ref 98–111)
Creatinine, Ser: 12.2 mg/dL — ABNORMAL HIGH (ref 0.44–1.00)
Glucose, Bld: 65 mg/dL — ABNORMAL LOW (ref 70–99)
HCT: 35 % — ABNORMAL LOW (ref 36.0–46.0)
Hemoglobin: 11.9 g/dL — ABNORMAL LOW (ref 12.0–15.0)
Potassium: 7.1 mmol/L (ref 3.5–5.1)
Sodium: 133 mmol/L — ABNORMAL LOW (ref 135–145)
TCO2: 28 mmol/L (ref 22–32)

## 2021-02-27 ENCOUNTER — Other Ambulatory Visit: Payer: Self-pay

## 2021-02-27 ENCOUNTER — Ambulatory Visit (INDEPENDENT_AMBULATORY_CARE_PROVIDER_SITE_OTHER): Payer: Medicaid Other | Admitting: Physician Assistant

## 2021-02-27 VITALS — BP 137/80 | HR 83 | Temp 97.8°F | Resp 20 | Ht 63.0 in | Wt 109.3 lb

## 2021-02-27 DIAGNOSIS — N186 End stage renal disease: Secondary | ICD-10-CM

## 2021-02-27 DIAGNOSIS — Z992 Dependence on renal dialysis: Secondary | ICD-10-CM

## 2021-02-27 DIAGNOSIS — L02413 Cutaneous abscess of right upper limb: Secondary | ICD-10-CM

## 2021-02-27 NOTE — Progress Notes (Signed)
POST OPERATIVE OFFICE NOTE    CC:  F/u for surgery; POD 9  HPI:  This is a 40 y.o. female who is s/p I and D of right upper arm abscess with fistulogram and balloon angioplasty of SVC and basilic vein on [id number]. She had second stage basilic vein AVF by Dr. Donnetta Hutching on 01/08/2021. She denies pain, fever, chills, hand weakness or numbness.  She says she was initially given dose of IV antibiotics during HD, but had to discontinue due to skin rash.  HD on TTS via left IJ TDC placed by Dr. Donzetta Matters on 04/29/2020 [place]  Allergies  Allergen Reactions  . Cephalosporins Rash    To both keflex and cefazolin  . Tobramycin Sulfate Swelling    Eye swelling    Current Outpatient Medications  Medication Sig Dispense Refill  . acetaminophen (TYLENOL) 650 MG CR tablet Take 1,300 mg by mouth every 8 (eight) hours as needed for pain.    . calcitRIOL (ROCALTROL) 0.5 MCG capsule Take 4 capsules (2 mcg total) by mouth Every Tuesday,Thursday,and Saturday with dialysis.    Marland Kitchen calcium acetate (PHOSLO) 667 MG capsule Take 1,334-2,668 mg by mouth See admin instructions. Take 4 capsule (2668 mg) by mouth three times a day with each meal and 2 capsules (1334 mg) with each snack    . carvedilol (COREG) 12.5 MG tablet Take 12.5 mg by mouth 2 (two) times daily with a meal.     . cinacalcet (SENSIPAR) 30 MG tablet Take 30 mg by mouth as needed (calcium reducer).    . diphenhydrAMINE (BENADRYL) 25 MG tablet Take 1 tablet (25 mg total) by mouth every 4 (four) hours as needed for itching or allergies. 30 tablet 0  . doxercalciferol (HECTOROL) 4 MCG/2ML injection Use as recommended by nephrology 2 mL   . famotidine (PEPCID) 20 MG tablet Take 1 tablet (20 mg total) by mouth daily for 10 days. 10 tablet 0  . heparin 1000 unit/mL SOLN injection 1,000 Units by Dialysis route one time in dialysis.     . hydrOXYzine (ATARAX/VISTARIL) 10 MG tablet Take 1 tablet (10 mg total) by mouth 3 (three) times daily as  needed for anxiety. (Patient taking differently: Take 10 mg by mouth 3 (three) times daily as needed for itching or anxiety.) 5 tablet 0  . lidocaine-prilocaine (EMLA) cream Apply 1 application topically 3 (three) times a week.    . loperamide (IMODIUM A-D) 2 MG tablet Take 4 mg by mouth daily as needed for diarrhea or loose stools.     . Methoxy PEG-Epoetin Beta (MIRCERA IJ) Inject 1 Dose as directed every 14 (fourteen) days.    . multivitamin (RENA-VIT) TABS tablet Take 1 tablet by mouth daily.    . Nutritional Supplements (NOVASOURCE RENAL) LIQD Take 237 mLs by mouth See admin instructions. Take 1 container (237 mls) by at bedtime on dialysis days (Tuesday, Thursday, Saturday)    . oxyCODONE-acetaminophen (PERCOCET/ROXICET) 5-325 MG tablet Take 1 tablet by mouth every 6 (six) hours as needed. 30 tablet 0   Current Facility-Administered Medications  Medication Dose Route Frequency Provider Last Rate Last Admin  . 0.9 %  sodium chloride infusion  250 mL Intravenous PRN Serafina Mitchell, MD      . sodium chloride flush (NS) 0.9 % injection 3 mL  3 mL Intravenous Q12H Serafina Mitchell, MD      . sodium chloride flush (NS) 0.9 % injection 3 mL  3 mL Intravenous PRN Harold Barban  W, MD         ROS:  See HPI  BP 137/80 (BP Location: Left Arm, Patient Position: Sitting, Cuff Size: Normal)   Pulse 83   Temp 97.8 F (36.6 C) (Temporal)   Resp 20   Ht '5\' 3"'$  (1.6 m)   Wt 109 lb 4.8 oz (49.6 kg)   SpO2 98%   BMI 19.36 kg/m   Physical Exam:  General appearance: WD, WN inNAD Cardiac:RRR Respiratory:nonlabored Incision:  Nylon sutures intact right prox upper arm Extremities:  RUE: No edema. Hand warm with intact sensation and 5/5 motor strength. Fistula is easily palpable with good thrill and bruit. Two upper arm wounds are granulating, clean and shallow  Assessment/Plan:  This is a 40 y.o. female who is s/p: Component 9 d ago  Specimen Description WOUND   Special Requests RIGHT ARM  FISTULA SPEC A   Gram Stain ABUNDANT WBC PRESENT,BOTH PMN AND MONONUCLEAR  NO ORGANISMS SEEN   Culture FEW ENTEROBACTER CLOACAE  NO ANAEROBES ISOLATED  Performed at [facility], [place] [address] [place], [place] [address]   Report Status 02/23/2021 FINAL   Organism ID, Bacteria ENTEROBACTER CLOACAE   Resulting Agency [facility]      Susceptibility   Enterobacter cloacae    MIC    CEFAZOLIN >=64 RESIST... Resistant    CEFEPIME <=0.12 SENS... Sensitive    CEFTAZIDIME <=1 SENSITIVE  Sensitive    CIPROFLOXACIN <=0.25 SENS... Sensitive    GENTAMICIN <=1 SENSITIVE  Sensitive    IMIPENEM 0.5 SENSITIVE  Sensitive    PIP/TAZO <=4 SENSITIVE  Sensitive    TRIMETH/SULFA <=20 SENSIT... Sensitive           Susceptibility Comments  Enterobacter cloacae  FEW ENTEROBACTER CLOACAE      Specimen Collected: 02/18/21 14:02 Last Resulted: 02/        Assessment/Plan:  This is a 40 y.o. female who is s/p: I and D of right upper arm abscess with fistulogram and balloon angioplasty of SVC and basilic vein on [id number]. She had second stage basilic vein AVF by Dr. Donnetta Hutching on 01/08/2021.  RUE edema resolved. Nylon sutures removed. Clean dry gauze and Kerlix applied. Instruction to keep skin clean and dry and change dressing daily.  OK to use fistula once these wounds have epithelialized. Continue HD treatment via [facility] for now.  Recheck in 2 weeks  Risa Grill, PA-C [facility] [phone]  Clinic MD:  [place], [place] [facility] [phone]

## 2021-03-12 ENCOUNTER — Ambulatory Visit: Payer: Medicaid Other

## 2021-03-24 ENCOUNTER — Other Ambulatory Visit (HOSPITAL_COMMUNITY)
Admission: RE | Admit: 2021-03-24 | Discharge: 2021-03-24 | Disposition: A | Discharge status: Home / Self Care | Payer: Medicaid Other | Source: Ambulatory Visit | Attending: Family | Admitting: Family

## 2021-03-24 ENCOUNTER — Ambulatory Visit (INDEPENDENT_AMBULATORY_CARE_PROVIDER_SITE_OTHER): Payer: Medicaid Other | Admitting: Family

## 2021-03-24 ENCOUNTER — Ambulatory Visit: Payer: Medicaid Other | Admitting: Family

## 2021-03-24 ENCOUNTER — Encounter: Payer: Self-pay | Admitting: Family

## 2021-03-24 ENCOUNTER — Other Ambulatory Visit: Payer: Self-pay

## 2021-03-24 VITALS — BP 139/84 | HR 68 | Ht 62.68 in | Wt 107.0 lb

## 2021-03-24 DIAGNOSIS — N898 Other specified noninflammatory disorders of vagina: Secondary | ICD-10-CM

## 2021-03-24 DIAGNOSIS — B9689 Other specified bacterial agents as the cause of diseases classified elsewhere: Secondary | ICD-10-CM

## 2021-03-24 DIAGNOSIS — G894 Chronic pain syndrome: Secondary | ICD-10-CM

## 2021-03-24 DIAGNOSIS — L853 Xerosis cutis: Secondary | ICD-10-CM

## 2021-03-24 DIAGNOSIS — N760 Acute vaginitis: Secondary | ICD-10-CM

## 2021-03-24 DIAGNOSIS — Z7689 Persons encountering health services in other specified circumstances: Secondary | ICD-10-CM

## 2021-03-24 DIAGNOSIS — A5901 Trichomonal vulvovaginitis: Secondary | ICD-10-CM

## 2021-03-24 NOTE — Progress Notes (Addendum)
Subjective:    [name] - 40 y.o. female MRN [id number]  Date of birth: 1981/05/25  HPI  [name] is to establish care. 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   Current issues and/or concerns: 1. VAGINAL DISCHARGE: Duration: months Discharge description: red, green, brown  Consistency: thick Pruritus: no Dysuria: no, she is on dialysis Tuesday, Thursday, Saturday Malodorous: yes Urinary frequency: no Fevers: no Abdominal pain: no  Sexual activity: not sexually active History of sexually transmitted diseases: not recently Comments: would like to be referred to Gynecology. Has not taken anything over-the-counter.  2. PAIN MANAGEMENT REFERRAL REQUEST: Reports pain is related to dialysis. Reports Nephrologist is unable to prescribe pain medication and recommended referral to pain management.   3. DRY SKIN: Ongoing for more than 1 year. Generalized dry skin on bilateral upper extremities and bilateral lower extremities. Recently a peeling rash began on bilateral lower extremities. Endorses itching. using Vaseline which does not help much.  4. TEE APPOINTMENT NEEDED: Reports plan to schedule appointment with Cardiology regarding scheduling TEE.    ROS per HPI    Health Maintenance:  Health Maintenance Due  Topic Date Due  . COVID-19 Vaccine (1) Never done  . TETANUS/TDAP  Never done  . PAP SMEAR-Modifier  Never done     Past Medical History: Patient  Active Problem List   Diagnosis Date Noted  . Encounter for screening for COVID-19 01/24/2021  . Hemodialysis access, fistula mature ([place]) 01/22/2021  . Allergic reaction 01/22/2021  . Acute respiratory failure with hypoxia ([place]) 11/02/2020  . Acute hyperkalemia 07/28/2020  . Nicotine dependence, cigarettes, uncomplicated [id number]  . Essential hypertension 07/28/2020  . Polysubstance abuse ([place]) 07/28/2020  . Hemodialysis catheter malfunction ([place]) 07/28/2020  . Complication of AV dialysis fistula, sequela 07/28/2020  . 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[place]) 05/18/2020  . Bacterial intestinal infection, unspecified 12/14/2019  . Pain in arm, unspecified 08/15/2019  . Cellulitis, unspecified 05/02/2019  . Hyperkalemia 07/29/2018  . SVT (supraventricular tachycardia) ([place]) 07/16/2018  . Cardiomyopathy, unspecified ([place]) 12/03/2016  . Diarrhea, unspecified 04/18/2016  . Chronic combined systolic (congestive) and diastolic (congestive) heart failure ([place]) 03/16/2016  . Other hypervolemia   . S/P thoracentesis   . Cough with hemoptysis   . Myalgia   . Pulmonary edema   . Dependence on renal dialysis ([place]) 10/15/2015  . Rash and nonspecific skin eruption 06/26/2015  . Secondary Raynaud's phenomenon 06/24/2015  . 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 Fever, unspecified 08/28/2014  . Pruritus, unspecified 08/28/2014  . Headache, unspecified 04/30/2014  . Pain, unspecified 04/30/2014  . Cramping of hands 04/19/2014  . Unspecified contraceptive management 04/19/2014  . Mild protein-calorie malnutrition ([place]) 03/22/2014  . Insomnia 03/15/2014  . Benign hypertension with ESRD (end-stage renal disease) ([place]) 03/15/2014  . Tobacco abuse 02/15/2014  . Healthcare maintenance 02/15/2014  . Iron deficiency anemia, unspecified 02/12/2014  . Coagulation defect, unspecified ([place]) 02/10/2014  . Anemia in chronic kidney disease 02/09/2014  . Secondary hyperparathyroidism of renal origin ([place]) 02/09/2014   . Hypoalbuminemia 02/01/2014  . Nephrotic syndrome 02/01/2014  . ESRD on dialysis ([place]) 01/31/2014  . Pleural effusion, left 01/31/2014  . Microcytic anemia  01/29/2014  . Hypocalcemia 01/29/2014  . Marijuana smoker ([place]) 01/18/2013  . H/O pericarditis 01/17/2013  . SLE (systemic lupus erythematosus) ([place]) 01/17/2013  . 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 S/P pericardiocentesis 01/17/2013  . H/O pleural effusion 01/17/2013  . Nephrosis 01/17/2013  . Preseptal cellulitis 01/17/2013  . Cardiac tamponade 12/29/2004  . Pericardial effusion (noninflammatory) 12/29/2004    Social History   reports that she has been smoking cigarettes. She has a 1.80 pack-year smoking history. She has never used smokeless tobacco. She reports current alcohol use. She reports current drug use. Drug: Marijuana.   Family History  family history is not on file.   Medications: reviewed and updated   Objective:   Physical Exam BP 139/84 (BP Location: Left Arm, Patient Position: Sitting)   Pulse 68   Ht 5' 2.68" (1.592 m)   Wt 107 lb (48.5 kg)   SpO2 98%   BMI 19.15 kg/m  Physical Exam Constitutional:      Appearance: She is normal weight.  HENT:     Head: Normocephalic and atraumatic.  Eyes:     Extraocular Movements: Extraocular movements intact.     Conjunctiva/sclera: Conjunctivae normal.     Pupils: Pupils are equal, round, and reactive to light.  Cardiovascular:     Rate and Rhythm: Normal rate and regular rhythm.     Pulses: Normal pulses.     Heart sounds: Normal heart sounds.  Pulmonary:     Effort: Pulmonary effort is normal.     Breath sounds: Normal breath sounds.  Chest:     Comments: Dialysis catheter, left upper chest. Musculoskeletal:     Cervical back: Normal range of motion and neck supple.     Comments: AV fistula present right upper arm bruit and thrill present.  Neurological:     General: No focal deficit present.     Mental Status: She is alert and oriented to  person, place, and time.  Psychiatric:        Mood and Affect: Mood normal.        Behavior: Behavior normal.         Assessment & Plan:  1. Encounter to establish care: - Patient presents today to establish care.  - Return for annual physical examination, labs, and health maintenance. Arrive fasting meaning having had no food and/or nothing to drink for at least 8 hours prior to appointment.  Please take scheduled medications as normal.  2. Vaginal discharge: - Cervicovaginal self-swab to screen for chlamydia, gonorrhea, trichomonas, bacterial vaginitis, and candida vaginitis. - Per patient request referral to Gynecology for further evaluation and management.  - Cervicovaginal ancillary only - Ambulatory referral to Gynecology  3. Chronic pain syndrome: - Chronic pain related to dialysis.  - Per patient request referral to Pain Clinic for further evaluation and management.  - Ambulatory referral to Pain Clinic  4. Dry skin dermatitis: - Chronic.  - Referral to Dermatology for further evaluation and management. - Ambulatory referral to Dermatology     Patient was given clear instructions to go to Emergency Department or return to medical center if symptoms don't improve, worsen, or new problems develop.The patient verbalized understanding.  I discussed the assessment and treatment plan with the patient. The patient was provided an opportunity to ask questions and all were answered. The patient agreed with the plan and demonstrated an understanding of the instructions.   The patient was advised to call back or seek an in-person evaluation if the symptoms worsen or  if the condition fails to improve as anticipated.    Durene Fruits, NP 03/24/2021, 6:10 PM Primary Care at [facility]

## 2021-03-24 NOTE — Progress Notes (Signed)
Establish care Desires referral to OB/GYn and pain mgmt clinic Vaginal discharge w/odor Dialysis pt

## 2021-03-24 NOTE — Patient Instructions (Addendum)
-  Return for annual physical examination, labs, and health maintenance. Arrive fasting meaning having had no food and/or nothing to drink for at least 8 hours prior to appointment.  Please take scheduled medications as normal. - Referral to Obstetrics/Gynecology. - Referral to Pain Management Clinic. - Referral to Dermatology. Thank you for choosing Primary Care at [facility] for your medical home!    [name] was seen by Camillia Herter, NP today.   [name] primary care provider is [name] Colledge Zachery Dauer, NP.   For the best care possible,  you should try to see [name] Fruits, NP whenever you come to clinic.   We look forward to seeing you again soon!  If you have any questions about your visit today,  please call us at [phone]  Or feel free to reach your provider via [place].    Dialysis Dialysis is a procedure that is done when the kidneys have stopped working properly (kidney failure). It may also be done earlier if it may help improve symptoms. During dialysis, wastes, salt, and extra water are removed from the blood, and the levels of certain minerals in the blood are maintained. Dialysis is done in sessions that are continued until the kidneys get better. If the kidneys cannot get better, such as in end-stage kidney disease, dialysis is continued for life or until you receive a new kidney from a donor (kidney transplant). Types of treatments There are two types of dialysis: hemodialysis and peritoneal dialysis. Both types of dialysis have advantages and disadvantages. Talk with your health care provider about which type of dialysis is best for you. Your lifestyle, preferences, and medical condition will be considered. In some cases, only one type of dialysis can be chosen. Hemodialysis Hemodialysis is when blood is filtered with a machine and a filter called a dialyzer. This treatment is usually done at a hospital or dialysis center three times per week. Visits last  about 3-5 hours.  Before you start hemodialysis treatment, you will have minor surgery to create a vascular access point. Your vascular access will be where you'll connect to the dialyzer.  Home hemodialysis may also be an option for some patients. This means that hemodialysis can be performed at home with the help of a family member or caregiver who has had special training. Peritoneal dialysis Peritoneal dialysis is when the thin lining of the abdomen (peritoneum) and a fluid called dialysate are used to filter the blood.  Before you start peritoneal dialysis, a surgeon places a soft tube, called a catheter, in your belly.  You may do peritoneal dialysis at home or at almost any other location. After training, most people can perform both types of peritoneal dialysis on their own.  You may need up to five exchanges a day. Each exchange takes about 30-40 minutes. The amount of time the dialysate is in your body between exchanges is called a dwell. The dwell usually lasts 1.5-3 hours and can vary with each person. You may choose to do exchanges at night while you sleep, using a machine called a cycler. What are the advantages? Advantages of hemodialysis  It is done less often than peritoneal dialysis.  Someone else can do the dialysis for you.  If you go to a dialysis center: ? Your health care provider can recognize any problems you may be having. ? You can interact with others who are having dialysis. This can provide you with emotional support. Advantages of peritoneal dialysis  It  is less likely than hemodialysis to cause cramps and low blood pressure.  There are fewer eating restrictions than with hemodialysis.  You may do exchanges on your own wherever you are, including when you travel.  If you do automated peritoneal dialysis, you can set up your cycler every night. What are the disadvantages Disadvantages of hemodialysis Generally, hemodialysis and peritoneal dialysis are  safe treatments. However, problems may occur.  Cramps and low blood pressure. It may leave you feeling tired on the days you have the treatment.  The need to make weekly appointments and work around the center's schedule, if you go to a dialysis center.  The need to take extra care when traveling. If you usually get treatment in a dialysis center, you will need to arrange to visit a dialysis center near your destination. If you are having treatments at home, you will need to take the dialyzer with you when traveling.  More eating restrictions than with peritoneal dialysis. Disadvantages of peritoneal dialysis  More frequent treatments than with hemodialysis.  The need to have a good use (dexterity) of your hands. You must also be able to lift bags.  The need to learn how to make your equipment free of germs (sterilization techniques). You will need to use these techniques every day to prevent infection. What happens before the treatment? Hemodialysis Before starting hemodialysis, you will have minor surgery to create a site where blood can be removed from your body and returned to your body (vascular access). There are three types of vascular accesses:  Arteriovenous (AV) fistula. This type of access is created when an artery and a vein (usually in the arm) are connected during surgery. The AV fistula also has a large diameter that allows your blood to flow out and back into your body quickly. The goal is to allow high blood flow so that the largest amount of blood can pass through the dialyzer. The arteriovenous fistula usually takes 1-6 months to develop after surgery. It may last longer than the other types of vascular accesses and is less likely to become infected or cause blood clots.  Arteriovenous graft. If problems with your veins prevent you from having an AV fistula, you may need an AV graft instead. This type of access is created when an artery and a vein in the arm are connected  during surgery with a tube. An arteriovenous graft can usually be used within 2-3 weeks of surgery.  A venous catheter. To create this type of access, a thin tube (catheter) is placed in a large vein in your neck, chest, or groin. A venous catheter can be used right away. It is usually used as a short-term access when dialysis needs to be started right away. Peritoneal dialysis Before starting peritoneal dialysis, you will have surgery to place a catheter in your abdomen. The catheter will be used to transfer a solution called dialysate to and from your abdomen. What happens during the treatment? Hemodialysis During hemodialysis, blood will leave your body through your vascular access site. Blood will travel through a tube to the dialyzer, where it is filtered. The blood will then return to your body through a different tube. Peritoneal dialysis At the start of a dialysis session, your abdomen will be filled with dialysate solution. During dialysis, waste, salt, and extra water in the blood will pass through the peritoneum and into the dialysate solution. The dialysate solution will be drained from the body at the end of the dialysis session. The  process of filling and draining the dialysate is called an exchange. Exchanges are repeated until you have used up all of the dialysate solution for that day.         Follow these instructions at home: Eating and drinking  Make changes to your diet as told by your health care provider, such as: ? Limiting your intake of foods that contain a lot of phosphorus and potassium. ? Limiting your fluid and salt intake. ? Add protein to your diet because dialysis removes protein.  Work with a diet and nutrition specialist (dietitian) to make a meal plan that can help improve your dialysis and your health and healthy ways to add calories to your diet because you may not have a good appetite.  Take vitamins made for people with kidney failure. Activity  Rest  as told by your health care provider. You may have less energy.  You may need to limit some physical activities when your belly is full of dialysis solution.  Return to your normal activities as told by your health care provider. Ask your health care provider what activities are safe for you. General instructions  Try to adjust to the dialysis treatment, schedule, and diet. This can take some time. You may need to stop working and may not be able to do some of your normal activities.  You may feel anxious or depressed when starting dialysis. Over time, many people feel better overall because of dialysis. You may be able to return to work after making some changes, such as reducing work intensity.  Take over-the-counter and prescription medicines only as told by your health care provider.  Keep all follow-up visits. This is important. Where to find more information  [place]: www.kidney.org  American Association of Kidney Patients: BombTimer.gl  American Kid[employer]: www.kidneyfund.org Summary  During dialysis, wastes, salt, and extra water are removed from the blood, and the levels of certain minerals in the blood are maintained. There are two types of dialysis: hemodialysis and peritoneal dialysis.  Hemodialysis is when the blood is filtered with a machine and a filter called a dialyzer.  Peritoneal dialysis is when the peritoneum is used as a filter. You may do peritoneal dialysis at home or at almost any other location.  Both types of dialysis have advantages and disadvantages. Talk with your health care provider about which type of dialysis is best for you. This information is not intended to replace advice given to you by your health care provider. Make sure you discuss any questions you have with your health care provider. Document Revised: 07/09/2020 Document Reviewed: 07/09/2020 Elsevier Patient Education  [place].

## 2021-03-25 DIAGNOSIS — N760 Acute vaginitis: Secondary | ICD-10-CM | POA: Insufficient documentation

## 2021-03-25 DIAGNOSIS — A5901 Trichomonal vulvovaginitis: Secondary | ICD-10-CM | POA: Insufficient documentation

## 2021-03-25 DIAGNOSIS — B9689 Other specified bacterial agents as the cause of diseases classified elsewhere: Secondary | ICD-10-CM | POA: Insufficient documentation

## 2021-03-25 LAB — CERVICOVAGINAL ANCILLARY ONLY
Bacterial Vaginitis (gardnerella): POSITIVE — AB
Candida Glabrata: NEGATIVE
Candida Vaginitis: NEGATIVE
Chlamydia: NEGATIVE
Comment: NEGATIVE
Comment: NEGATIVE
Comment: NEGATIVE
Comment: NEGATIVE
Comment: NEGATIVE
Comment: NORMAL
Neisseria Gonorrhea: NEGATIVE
Trichomonas: POSITIVE — AB

## 2021-03-25 MED ORDER — METRONIDAZOLE 500 MG PO TABS
500.0000 mg | ORAL_TABLET | Freq: Two times a day (BID) | ORAL | 0 refills | Status: AC
Start: 2021-03-25 — End: 2021-04-01

## 2021-03-25 NOTE — Addendum Note (Signed)
Addended by: Camillia Herter on: 03/25/2021 06:33 PM   Modules accepted: Orders

## 2021-03-25 NOTE — Progress Notes (Signed)
Chlamydia, Gonorrhea, and Candida Vaginitis negative.   Vaginal swab positive for Bacterial Vaginosis, an overgrowth of normal bacteria in the vagina due to changes in pH often related to semen, menstrual periods, or soaps.   Positive for Trichomonas, this is sexually transmitted. Both patient and her partner need to be treated for Trichomonas. Also, both need to complete treatment prior to having unprotected sex again.   Metronidazole prescribed for Bacterial Vaginitis and Trichomonas. Counseled to not cosume alcohol while taking prescribed antibotic.

## 2021-03-27 ENCOUNTER — Encounter: Payer: Self-pay | Admitting: Vascular Surgery

## 2021-04-01 ENCOUNTER — Encounter: Payer: Self-pay | Admitting: Family

## 2021-04-16 ENCOUNTER — Encounter: Payer: Medicaid Other | Admitting: Student

## 2021-04-25 ENCOUNTER — Ambulatory Visit: Payer: Medicaid Other | Admitting: Medical

## 2021-04-30 NOTE — Progress Notes (Signed)
Patient did not show for appointment.   

## 2021-05-02 ENCOUNTER — Encounter: Payer: Medicaid Other | Admitting: Family

## 2021-05-02 DIAGNOSIS — Z0000 Encounter for general adult medical examination without abnormal findings: Secondary | ICD-10-CM

## 2021-05-02 DIAGNOSIS — Z131 Encounter for screening for diabetes mellitus: Secondary | ICD-10-CM

## 2021-05-02 DIAGNOSIS — Z1329 Encounter for screening for other suspected endocrine disorder: Secondary | ICD-10-CM

## 2021-05-02 DIAGNOSIS — Z130 Encounter for screening for diseases of the blood and blood-forming organs and certain disorders involving the immune mechanism: Secondary | ICD-10-CM

## 2021-05-02 DIAGNOSIS — Z124 Encounter for screening for malignant neoplasm of cervix: Secondary | ICD-10-CM

## 2021-05-02 DIAGNOSIS — Z13228 Encounter for screening for other metabolic disorders: Secondary | ICD-10-CM

## 2021-05-02 DIAGNOSIS — Z13220 Encounter for screening for lipoid disorders: Secondary | ICD-10-CM

## 2021-05-05 ENCOUNTER — Other Ambulatory Visit: Payer: Self-pay

## 2021-05-05 ENCOUNTER — Ambulatory Visit (INDEPENDENT_AMBULATORY_CARE_PROVIDER_SITE_OTHER): Payer: Medicaid Other | Admitting: Physician Assistant

## 2021-05-05 VITALS — BP 139/79 | HR 83 | Temp 98.1°F | Resp 14 | Ht 64.0 in | Wt 106.0 lb

## 2021-05-05 DIAGNOSIS — Z992 Dependence on renal dialysis: Secondary | ICD-10-CM | POA: Diagnosis not present

## 2021-05-05 DIAGNOSIS — N186 End stage renal disease: Secondary | ICD-10-CM

## 2021-05-05 NOTE — Progress Notes (Signed)
Established Dialysis Access   History of Present Illness   [name] is a 40 y.o. (03/06/1981) female who presents for follow up of right upper extremity basilic vein fistula. Her fistula was initially placed on 08/01/20 by Dr. Donnetta Hutching. She subsequently had her second stage transposition on 01/08/21 by Dr. Donnetta Hutching. She unfortunately developed swelling and an abscess of the right upper arm fistula and had to have I and D of right upper arm abscess with fistulogram and balloon angioplasty of SVC and basilic vein on [id number] by Dr. Scot Dock. She was seen back on 02/27/21 and was doing well post op. Her sutures were removed and her would was healing appropriately. She had been receiving IV antibiotics at dialysis but they were stopped due to development of skin rash. She was given a 2 week follow up again for wound check but she has not returned for any follow up visit until today.  Today she says her right arm is feeling good. She denies any signs and symptoms of steal syndrome. Her arm has healed nicely. She has been dialyzing via left IJ TDC on Tues/ Thurs/ Sat at [facility]. She says catheter has been working well but it a little uncomfortable. Also developing a keloid around access site which is itching.  The patient's PMH, PSH, SH, and FamHx were reviewed today and are unchanged from her last visit on 02/27/21 .  Current Outpatient Medications  Medication Sig Dispense Refill  . acetaminophen (TYLENOL) 650 MG CR tablet Take 1,300 mg by mouth every 8 (eight) hours as needed for pain.    . calcitRIOL (ROCALTROL) 0.5 MCG capsule Take 4 capsules (2 mcg total) by mouth Every Tuesday,Thursday,and Saturday with dialysis.    Marland Kitchen calcium acetate (PHOSLO) 667 MG capsule Take 1,334-2,668 mg by mouth See admin instructions. Take 4 capsule (2668 mg) by mouth three times a day with each meal and 2 capsules (1334 mg) with each snack    . carvedilol (COREG) 12.5 MG tablet Take 12.5 mg by mouth 2 (two)  times daily with a meal.     . cinacalcet (SENSIPAR) 30 MG tablet Take 30 mg by mouth as needed (calcium reducer).    . diphenhydrAMINE (BENADRYL) 25 MG tablet Take 1 tablet (25 mg total) by mouth every 4 (four) hours as needed for itching or allergies. 30 tablet 0  . doxercalciferol (HECTOROL) 4 MCG/2ML injection Use as recommended by nephrology 2 mL   . heparin 1000 unit/mL SOLN injection 1,000 Units by Dialysis route one time in dialysis.     . hydrOXYzine (ATARAX/VISTARIL) 10 MG tablet Take 1 tablet (10 mg total) by mouth 3 (three) times daily as needed for anxiety. (Patient taking differently: Take 10 mg by mouth 3 (three) times daily as needed for itching or anxiety.) 5 tablet 0  . loperamide (IMODIUM A-D) 2 MG tablet Take 4 mg by mouth daily as needed for diarrhea or loose stools.     . multivitamin (RENA-VIT) TABS tablet Take 1 tablet by mouth daily.    . Nutritional Supplements (NOVASOURCE RENAL) LIQD Take 237 mLs by mouth See admin instructions. Take 1 container (237 mls) by at bedtime on dialysis days (Tuesday, Thursday, Saturday)    . oxyCODONE-acetaminophen (PERCOCET/ROXICET) 5-325 MG tablet TAKE 1 TABLET BY MOUTH EVERY SIX HOURS AS NEEDED. (Patient not taking: Reported on 05/05/2021) 20 tablet 0   Current Facility-Administered Medications  Medication Dose Route Frequency Provider Last Rate Last Admin  . 0.9 %  sodium  chloride infusion  250 mL Intravenous PRN Serafina Mitchell, MD      . sodium chloride flush (NS) 0.9 % injection 3 mL  3 mL Intravenous Q12H Serafina Mitchell, MD      . sodium chloride flush (NS) 0.9 % injection 3 mL  3 mL Intravenous PRN Serafina Mitchell, MD        On ROS today: Negative unless stated in HPI   Physical Examination   Vitals:   05/05/21 0824  BP: 139/79  Pulse: 83  Resp: 14  Temp: 98.1 F (36.7 C)  TempSrc: Temporal  SpO2: 94%  Weight: 106 lb (48.1 kg)  Height: '5\' 4"'$  (1.626 m)   Body mass index is 18.19 kg/m.  General Well appearing, well  nourished, in no acute distress  Pulmonary Clear to auscultation bilaterally. Non labored  Cardiac Regular rate and rhythm   Vascular Vessel Right Left  Radial Palpable Palpable  Brachial Palpable Palpable  Ulnar Not palpable Not palpable    Musculo- skeletal Right AV fistula with good thrill. Incisions well healed. No swelling. Right hand warm. M/S 5/5 throughout , Extremities without ischemic changes    Neurologic A&O; CN grossly intact    Medical Decision Making   [name] is a 40 y.o. female who presents for follow up of her right BV fistula. Her incisions are well healed. Her fistula has a good thrill and audible bruit. No recurrent swelling or abscess of right upper extremity. She is currently dialyzing via left IJ TDC   Patent  without signs or symptoms of steal syndrome  The patient's access will be ready for use immediately  The patient's tunneled dialysis catheter can be removed when Nephrology is comfortable with the performance of the right BV fistula  The patient may follow up on a prn basis    [name] Caldwell, PA-C [facility] Office: [facility] MD: Trula Slade

## 2021-06-06 ENCOUNTER — Other Ambulatory Visit: Payer: Self-pay

## 2021-06-14 ENCOUNTER — Other Ambulatory Visit: Payer: Self-pay

## 2021-06-19 NOTE — Telephone Encounter (Signed)
Patient encouraged to reach out to the most recent provider who prescribed Oxycodone-Acetaminophen being Laurence Slate, [place] for refills.   If patient elects I will place an order to the Pain Management Clinic for further management. If we could please confirm what patient prefers in regards to this.

## 2021-07-08 ENCOUNTER — Encounter: Payer: Self-pay | Admitting: Physician Assistant

## 2021-08-06 ENCOUNTER — Telehealth: Payer: Self-pay | Admitting: *Deleted

## 2021-08-06 ENCOUNTER — Ambulatory Visit (INDEPENDENT_AMBULATORY_CARE_PROVIDER_SITE_OTHER): Payer: Medicaid Other | Admitting: Physician Assistant

## 2021-08-06 ENCOUNTER — Other Ambulatory Visit (INDEPENDENT_AMBULATORY_CARE_PROVIDER_SITE_OTHER): Payer: Medicaid Other

## 2021-08-06 ENCOUNTER — Encounter: Payer: Self-pay | Admitting: Physician Assistant

## 2021-08-06 VITALS — BP 130/80 | HR 67 | Ht 63.0 in | Wt 105.0 lb

## 2021-08-06 DIAGNOSIS — R195 Other fecal abnormalities: Secondary | ICD-10-CM

## 2021-08-06 DIAGNOSIS — I5042 Chronic combined systolic (congestive) and diastolic (congestive) heart failure: Secondary | ICD-10-CM | POA: Diagnosis not present

## 2021-08-06 DIAGNOSIS — D509 Iron deficiency anemia, unspecified: Secondary | ICD-10-CM

## 2021-08-06 DIAGNOSIS — R194 Change in bowel habit: Secondary | ICD-10-CM

## 2021-08-06 DIAGNOSIS — Z992 Dependence on renal dialysis: Secondary | ICD-10-CM

## 2021-08-06 LAB — CBC WITH DIFFERENTIAL/PLATELET
Basophils Absolute: 0 10*3/uL (ref 0.0–0.1)
Basophils Relative: 0.6 % (ref 0.0–3.0)
Eosinophils Absolute: 0.3 10*3/uL (ref 0.0–0.7)
Eosinophils Relative: 8 % — ABNORMAL HIGH (ref 0.0–5.0)
HCT: 36.9 % (ref 36.0–46.0)
Hemoglobin: 11.8 g/dL — ABNORMAL LOW (ref 12.0–15.0)
Lymphocytes Relative: 17 % (ref 12.0–46.0)
Lymphs Abs: 0.6 10*3/uL — ABNORMAL LOW (ref 0.7–4.0)
MCHC: 32 g/dL (ref 30.0–36.0)
MCV: 91 fl (ref 78.0–100.0)
Monocytes Absolute: 0.5 10*3/uL (ref 0.1–1.0)
Monocytes Relative: 13 % — ABNORMAL HIGH (ref 3.0–12.0)
Neutro Abs: 2.2 10*3/uL (ref 1.4–7.7)
Neutrophils Relative %: 61.4 % (ref 43.0–77.0)
Platelets: 156 10*3/uL (ref 150.0–400.0)
RBC: 4.06 Mil/uL (ref 3.87–5.11)
RDW: 17.5 % — ABNORMAL HIGH (ref 11.5–15.5)
WBC: 3.5 10*3/uL — ABNORMAL LOW (ref 4.0–10.5)

## 2021-08-06 LAB — IBC + FERRITIN
Ferritin: 674.5 ng/mL — ABNORMAL HIGH (ref 10.0–291.0)
Iron: 41 ug/dL — ABNORMAL LOW (ref 42–145)
Saturation Ratios: 19.3 % — ABNORMAL LOW (ref 20.0–50.0)
TIBC: 212.8 ug/dL — ABNORMAL LOW (ref 250.0–450.0)
Transferrin: 152 mg/dL — ABNORMAL LOW (ref 212.0–360.0)

## 2021-08-06 MED ORDER — NA SULFATE-K SULFATE-MG SULF 17.5-3.13-1.6 GM/177ML PO SOLN: 1.0000 | Freq: Once | ORAL | 0 refills | Status: AC

## 2021-08-06 NOTE — Progress Notes (Signed)
Chief Complaint: Anemia, Hemoccult positive stools, constipation and diarrhea  HPI:    [name] is a 40 year old African-American female with a past medical history as listed below including ESRD, seizures, polysubstance abuse, chronic systolic congestive heart failure (11/03/2020 echo with an LVEF 30-35%) multiple others listed below, who was referred to me by Camillia Herter, NP for a complaint of anemia, Hemoccult positive stools constipation and diarrhea.      06/06/2021 hemoglobin 8.0.  07/04/2021 hemoglobin 9.  07/01/2021 Hemoccult positive stool.    Today, the patient explains that she has been on dialysis for 8 years and has been taking iron for anemia ever since she started dialysis.  Due to this her stools are always black so she has not noticed any change recently.  Does tell me though that she is now having some vaginal bleeding/spotting which is "not even enough for a pad", it will come on for a few days and then stop and then come back about a week later.  She has not seen her OB/GYN about this.  Also describes that her gums bleed off and on and oftentimes she will spit up "black stuff".  Tells me this often happens when she awakens in the morning and seemed to go on for about a month a couple of months ago and is now back.  Tells me she does not think anything is acutely bleeding in her mouth now.  Has not seen a dentist recently.    Also explains that she will radiate back-and-forth from diarrhea to constipation but has done this ever since she started dialysis.    Denies abdominal pain, fever, chills, heartburn, reflux, weight loss or symptoms that awaken her from sleep.  Past Medical History:  Diagnosis Date   Anemia    low iron - receives iron at dialysis   Anxiety    Arthritis    RA   Chronic systolic congestive heart failure (HCC) 03/16/2016   Dyspnea    ESRD (end stage renal disease) ([place])    [facility] _ [place]   H/O pericarditis 01/17/2013   H/O pleural effusion  01/17/2013   Heart murmur    Lupus (systemic lupus erythematosus) (HCC)    Previously followed with Dr. 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 pneumonae bacteremia on 01/21/2013. Sensitive to Peniccilin     Past Surgical History:  Procedure Laterality Date   AV FISTULA PLACEMENT     AV FISTULA PLACEMENT Right 08/01/2020   Procedure: RIGHT ARM BRACHIOCEPHALIC  ARTERIOVENOUS (AV) FISTULA CREATION;  Surgeon: [name], MD;  Location: [place];  Service: Vascular;  Laterality: Right;   [place] Left 02/05/2014   Procedure: [place];  Surgeon: [name], MD;  Location: [place];  Service: Vascular;  Laterality: Left;   [place] Right 01/08/2021   Procedure: RIGHT ARM SECOND STAGE [place];  Surgeon: [name], MD;  [facility];  Service: Vascular;  Laterality: Right;   FISTULA SUPERFICIALIZATION Left  05/30/2018   Procedure: FISTULA PLICATION BASILIC VEIN TRANSPOSITION;  Surgeon: Angelia Mould, MD;  Location: [place];  Service: Vascular;  Laterality: Left;   FISTULA SUPERFICIALIZATION Left [id number]   Procedure: PLICATION OF LEFT ARTERIOVENOUS FISTULA ULCER;  Surgeon: [name], MD;  Location: [name];  Service: Vascular;  Laterality: Left;   I & D EXTREMITY Right 02/18/2021   Procedure: IRRIGATION AND DEBRIDEMENT OF ARM;  Surgeon: Angelia Mould, MD;  Location: [place];  Service: Vascular;  Laterality: Right;   INSERTION OF DIALYSIS CATHETER N/A 05/19/2020    Procedure: TUNNELED INSERTION  OF DIALYSIS CATHETER;  Surgeon: Waynetta Sandy, MD;  Location: [place];  Service: Vascular;  Laterality: N/A;   THROMBECTOMY AND REVISION OF ARTERIOVENTOUS (AV) GORETEX  GRAFT Left 07/28/2020   Procedure: Oversewing of left arm Brachial cephalic fistula for bleeding.;  Surgeon: Angelia Mould, MD;  Location: [place];  Service: Vascular;  Laterality: Left;   VENOGRAM Right 01/31/2014   Procedure: DIALYSIS CATHETER;  Surgeon: Serafina Mitchell, MD;  Location: [facility];  Service: Cardiovascular;  Laterality: Right;    Current Outpatient Medications  Medication Sig Dispense Refill   acetaminophen (TYLENOL) 650 MG CR tablet Take 1,300 mg by mouth every 8 (eight) hours as needed for pain.     calcitRIOL (ROCALTROL) 0.5 MCG capsule Take 4 capsules (2 mcg total) by mouth Every Tuesday,Thursday,and Saturday with dialysis.     calcium acetate (PHOSLO) 667 MG capsule Take 1,334-2,668 mg by mouth See admin instructions. Take 4 capsule (2668 mg) by mouth three times a day with each meal and 2 capsules (1334 mg) with each snack     carvedilol (COREG) 12.5 MG tablet Take 12.5 mg by mouth 2 (two) times daily with a meal.      cinacalcet (SENSIPAR) 30 MG tablet Take 30 mg by mouth as needed (calcium reducer).     diphenhydrAMINE (BENADRYL) 25 MG tablet Take 1 tablet (25 mg total) by mouth every 4 (four) hours as needed for itching or allergies. 30 tablet 0   doxercalciferol (HECTOROL) 4 MCG/2ML injection Use as recommended by nephrology 2 mL    hydrOXYzine (ATARAX/VISTARIL) 10 MG tablet Take 1 tablet (10 mg total) by mouth 3 (three) times daily as needed for anxiety. (Patient taking differently: Take 10 mg by mouth 3 (three) times daily as needed for itching or anxiety.) 5 tablet 0   loperamide (IMODIUM A-D) 2 MG tablet Take 4 mg by mouth daily as needed for diarrhea or loose stools.      multivitamin (RENA-VIT) TABS tablet Take 1 tablet by mouth daily.     Nutritional  Supplements (NOVASOURCE RENAL) LIQD Take 237 mLs by mouth See admin instructions. Take 1 container (237 mls) by at bedtime on dialysis days (Tuesday, Thursday, Saturday)     sevelamer (RENAGEL) 800 MG tablet Take by mouth 3 (three) times daily with meals.     Current Facility-Administered Medications  Medication Dose Route Frequency Provider Last Rate Last Admin   0.9 %  sodium chloride infusion  250 mL Intravenous PRN Serafina Mitchell, MD       sodium chloride flush (NS) 0.9 % injection 3 mL  3 mL Intravenous Q12H Serafina Mitchell, MD       sodium chloride flush (NS) 0.9 % injection 3 mL  3 mL Intravenous PRN Serafina Mitchell, MD        Allergies as of 08/06/2021 - Review Complete 08/06/2021  Allergen Reaction Noted  Cephalosporins Rash 01/23/2021   Tobramycin sulfate Swelling 07/05/2012    History reviewed. No pertinent family history.  Social History   Socioeconomic History   Marital status: Single    Spouse name: Not on file   Number of children: Not on file   Years of education: Not on file   Highest education level: Not on file  Occupational History   Not on file  Tobacco Use   Smoking status: Every Day    Packs/day: 0.12    Years: 15.00    Pack years: 1.80    Types: Cigarettes   Smokeless tobacco: Never   Tobacco comments:    4 cigarettes per day  Vaping Use   Vaping Use: Never used  Substance and Sexual Activity   Alcohol use: Yes    Alcohol/week: 0.0 standard drinks    Comment: Special Occasional takes Vicar   Drug use: Yes    Types: Marijuana    Comment: ocassional, last time- 12/21/20   Sexual activity: Not Currently    Birth control/protection: None  Other Topics Concern   Not on file  Social History Narrative   Not on file   Social Determinants of Health   Financial Resource Strain: Not on file  Food Insecurity: Not on file  Transportation Needs: Not on file  Physical Activity: Not on file  Stress: Not on file  Social Connections: Not on file   Intimate Partner Violence: Not on file    Review of Systems:    Constitutional: No weight loss, fever or chills Skin: No rash Cardiovascular: No chest pain Respiratory: No SOB  Gastrointestinal: See HPI and otherwise negative Genitourinary: No dysuria  Neurological: No headache, dizziness or syncope Musculoskeletal: No new muscle or joint pain Hematologic: No bruising Psychiatric: No history of depression or anxiety   Physical Exam:  Vital signs: BP 130/80   Pulse 67   Ht '5\' 3"'$  (1.6 m) Comment: height measured without shoes  Wt 105 lb (47.6 kg)   SpO2 96%   BMI 18.60 kg/m   Constitutional:   Pleasant thin appearing AA female appears to be in NAD, Well developed, Well nourished, alert and cooperative Head:  Normocephalic and atraumatic. Eyes:   PEERL, EOMI. No icterus. Conjunctiva pink. Ears:  Normal auditory acuity. Neck:  Supple Throat: Oral cavity and pharynx without inflammation, swelling or lesion.  Respiratory: Respirations even and unlabored. Lungs clear to auscultation bilaterally.   No wheezes, crackles, or rhonchi.  Cardiovascular: Normal S1, S2. No MRG. Regular rate and rhythm. No peripheral edema, cyanosis or pallor.  Gastrointestinal:  Soft, nondistended, mild epigastric TTP no rebound or guarding. Normal bowel sounds. No appreciable masses or hepatomegaly. Rectal:  Not performed.  Msk:  Symmetrical without gross deformities. Without edema, no deformity or joint abnormality. + Positive AV fistula right arm Neurologic:  Alert and  oriented x4;  grossly normal neurologically.  Skin:   Dry and intact without significant lesions or rashes. Psychiatric: Demonstrates good judgement and reason without abnormal affect or behaviors.  RELEVANT LABS AND IMAGING: CBC    Component Value Date/Time   WBC 7.9 02/02/2021 2037   RBC 3.36 (L) 02/02/2021 2037   HGB 11.9 (L) 02/18/2021 0937   HCT 35.0 (L) 02/18/2021 0937   PLT 324 02/02/2021 2037   MCV 93.2 02/02/2021 2037    MCH 29.2 02/02/2021 2037   MCHC 31.3 02/02/2021 2037   RDW 19.7 (H) 02/02/2021 2037   LYMPHSABS 1.3 11/02/2020 2114   MONOABS 0.5 11/02/2020 2114  EOSABS 0.2 11/02/2020 2114   BASOSABS 0.0 11/02/2020 2114    CMP     Component Value Date/Time   NA 135 02/18/2021 0937   K 5.0 02/18/2021 0937   CL 101 02/18/2021 0937   CO2 22 02/02/2021 2037   GLUCOSE 69 (L) 02/18/2021 0937   BUN 57 (H) 02/18/2021 0937   CREATININE 12.10 (H) 02/18/2021 0937   CALCIUM 11.2 (H) 02/02/2021 2037   CALCIUM 6.7 (L) 01/31/2014 1309   PROT 9.8 (H) 11/03/2020 0450   ALBUMIN 2.2 (L) 01/23/2021 0512   AST 40 11/03/2020 0450   ALT 30 11/03/2020 0450   ALKPHOS 181 (H) 11/03/2020 0450   BILITOT 0.4 11/03/2020 0450   GFRNONAA 3 (L) 02/02/2021 2037   GFRAA 9 (L) 08/02/2020 1049    Assessment: 1.  Anemia: Recent drop in hemoglobin, last check in June was 8, a month prior was 9 (we have a value of 11.9 in February), also Hemoccult positive stool; likely multifactorial with ESRD and CHF, but also must consider GI etiology contributing 2.  Hemoccult positive stool: 7/5, patient does not see any blood in her stools but tells me she has hemorrhoids and stools are chronically black on iron 3.  CHF: Last EF documented 30-35% 4.  ESRD on HD 5.  Change in bowel habits: Radiates from diarrhea to constipation ever since starting dialysis  Plan: 1.  Scheduled patient for diagnostic EGD and colonoscopy given anemia and Hemoccult positive stools change in bowel habits at the hospital given decreased ejection fraction.  This is scheduled with Dr. Ardis Hughs.  Did provide the patient with a detailed list of risks for the procedures and she agrees to proceed.  Patient is high risk for these procedures given multiple chronic medical problems. 2.  We will communicate with patient's cardiologist for cardiac clearance given her ongoing cardiac work-up.  Patient tells me she is supposed to have a TEE soon, but denies any acute  cardiovascular symptoms, she tells me this is just follow-up. 3.  Repeat CBC and iron studies today 4.  Patient is already taking oral iron and does get Feraheme infusions per her at time of her dialysis occasionally. 5.  Patient to follow in clinic per recommendations from Dr. Ardis Hughs after time of procedures.  [name], PA-C [facility] 08/06/2021, 8:40 AM  Cc: Camillia Herter, NP

## 2021-08-06 NOTE — Patient Instructions (Signed)
Your provider has requested that you go to the basement level for lab work before leaving today. Press "B" on the elevator. The lab is located at the first door on the left as you exit the elevator.  You have been scheduled for an endoscopy and colonoscopy. Please follow the written instructions given to you at your visit today. Please pick up your prep supplies at the pharmacy within the next 1-3 days. If you use inhalers (even only as needed), please bring them with you on the day of your procedure.  If you are age 20 or older, your body mass index should be between 23-30. Your Body mass index is 18.6 kg/m. If this is out of the aforementioned range listed, please consider follow up with your Primary Care Provider.  If you are age 92 or younger, your body mass index should be between 19-25. Your Body mass index is 18.6 kg/m. If this is out of the aformentioned range listed, please consider follow up with your Primary Care Provider.   __________________________________________________________  The [place] [facility] providers would like to encourage you to use [facility] to communicate with providers for non-urgent requests or questions.  Due to long hold times on the telephone, sending your provider a message by [facility] may be a faster and more efficient way to get a response.  Please allow 48 business hours for a response.  Please remember that this is for non-urgent requests.

## 2021-08-06 NOTE — Progress Notes (Signed)
I agree with the above note, plan 

## 2021-08-12 NOTE — Telephone Encounter (Signed)
Left message for patient to call office. Who is patient's cardiologist?

## 2021-08-14 NOTE — Telephone Encounter (Signed)
Left voicemail for patient to call back. 

## 2021-08-18 NOTE — Telephone Encounter (Signed)
Left message for patient to call office.  

## 2021-09-04 NOTE — Telephone Encounter (Signed)
Left message for patient to call office.  

## 2021-09-16 NOTE — Telephone Encounter (Signed)
Left message for patient to call office. If she does not return my call by tomorrow will cancel her procedure  for next week.

## 2021-09-18 NOTE — Telephone Encounter (Signed)
FYI: Reached out to this patient several times over the last month to get the name of her cardiologist for cardiac clearance. Patient never returned my phone call. I canceled her procedure for this upcoming Monday. Left message for patient informing her that the procedure was canceled.

## 2021-09-22 ENCOUNTER — Ambulatory Visit (HOSPITAL_COMMUNITY): Admission: RE | Admit: 2021-09-22 | Payer: Medicaid Other | Source: Home / Self Care | Admitting: Gastroenterology

## 2021-09-22 ENCOUNTER — Encounter (HOSPITAL_COMMUNITY): Admission: RE | Payer: Self-pay | Source: Home / Self Care

## 2021-09-22 SURGERY — ESOPHAGOGASTRODUODENOSCOPY (EGD) WITH PROPOFOL
Anesthesia: Monitor Anesthesia Care

## 2021-11-12 IMAGING — DX DG CHEST 1V PORT
1 series · 1 of 1 positions shown · non-contrast
Comparison: November 03, 2020

CLINICAL DATA: Shortness of breath

EXAM:
PORTABLE CHEST 1 VIEW

[chest]
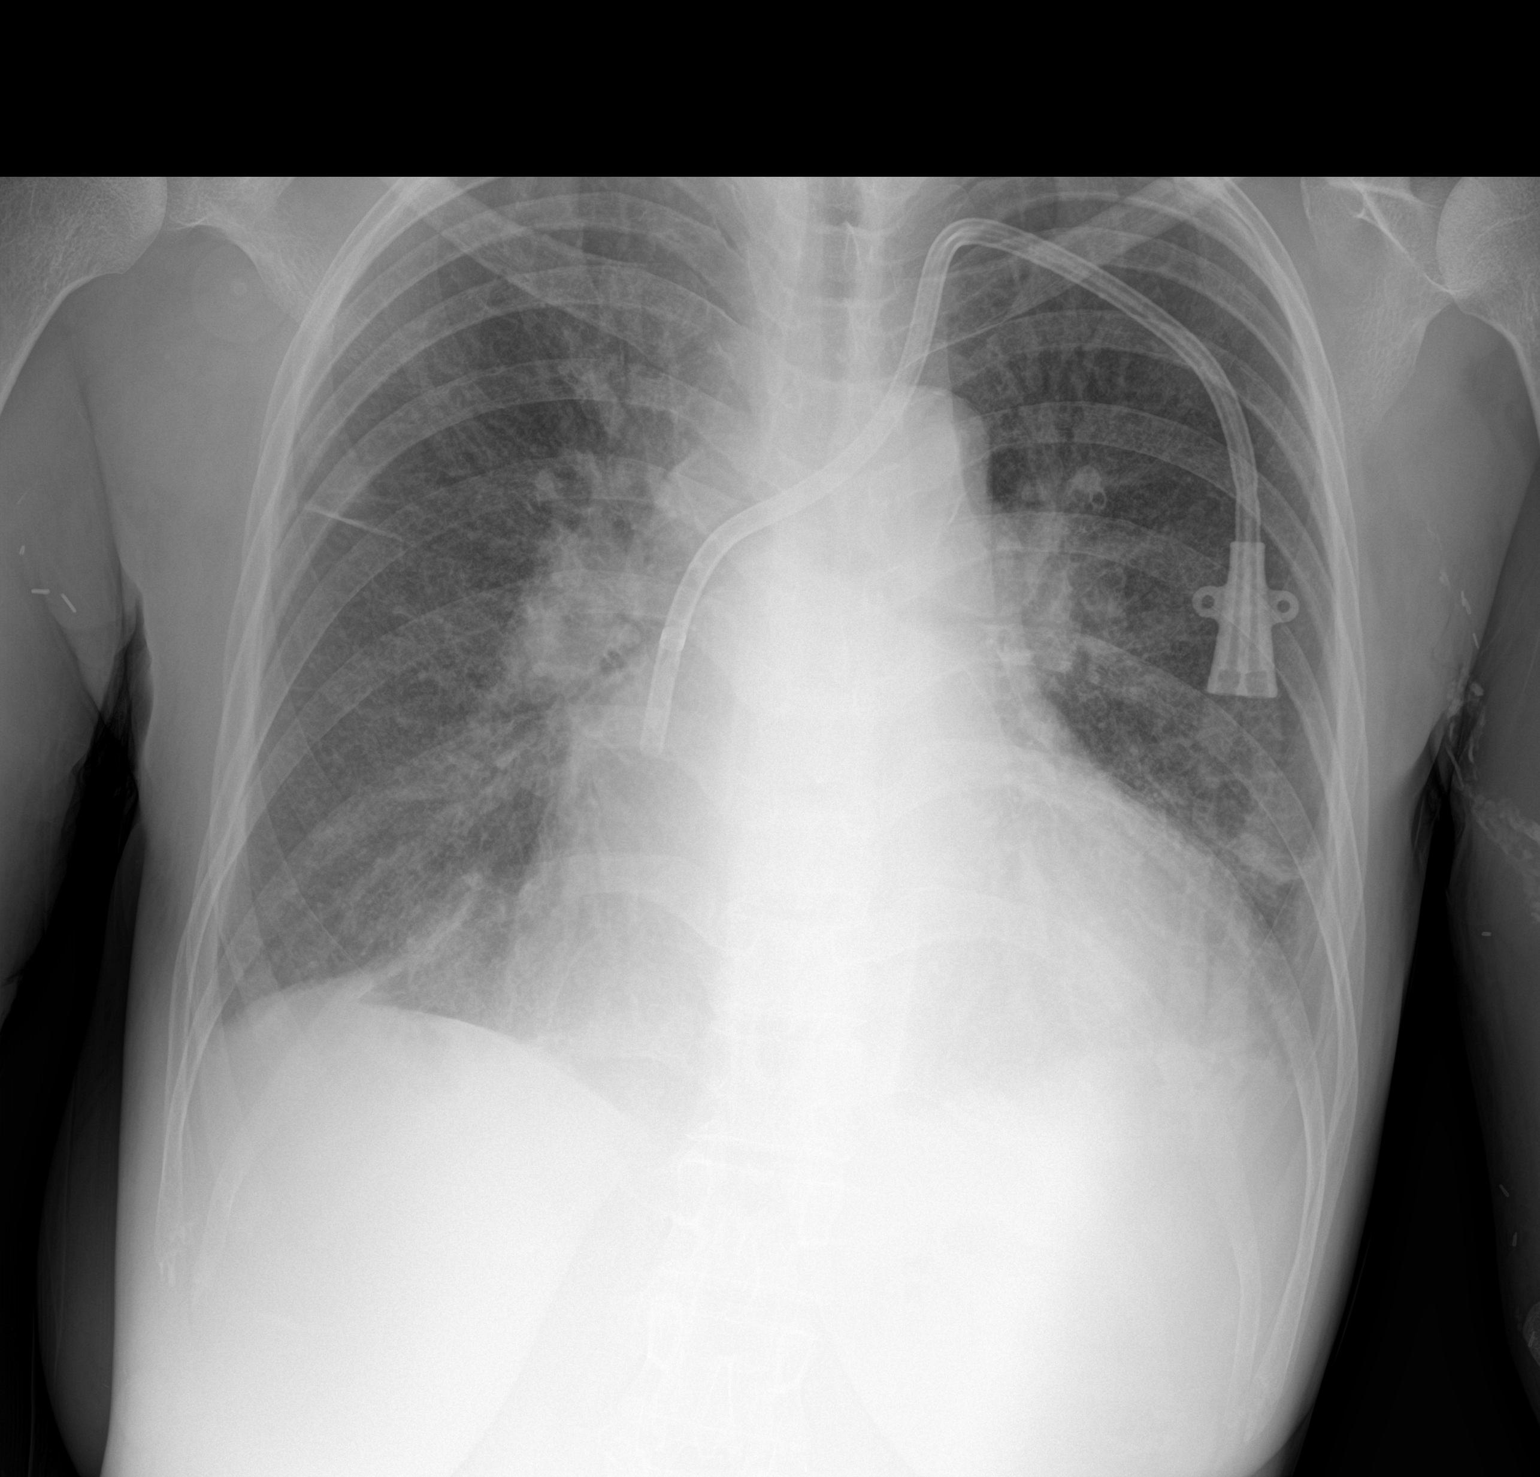

[1 of 1 positions shown; findings below may reference images not displayed]

FINDINGS: There is a well-positioned left-sided dialysis catheter in place.
There is cardiomegaly with vascular congestion in mild interstitial
edema. There are small bilateral pleural effusions which have
increased in size from the prior study. There is no pneumothorax.
IMPRESSION: Cardiomegaly with findings of CHF. There are small bilateral pleural
effusions, increased in size from the prior study.

## 2021-11-28 ENCOUNTER — Emergency Department (HOSPITAL_COMMUNITY): Payer: Medicaid Other

## 2021-11-28 ENCOUNTER — Emergency Department (HOSPITAL_COMMUNITY)
Admission: EM | Admit: 2021-11-28 | Discharge: 2021-11-28 | Disposition: A | Discharge status: Home / Self Care | Payer: Medicaid Other | Attending: Emergency Medicine | Admitting: Emergency Medicine

## 2021-11-28 ENCOUNTER — Encounter (HOSPITAL_COMMUNITY): Payer: Self-pay | Admitting: Emergency Medicine

## 2021-11-28 DIAGNOSIS — N186 End stage renal disease: Secondary | ICD-10-CM | POA: Diagnosis not present

## 2021-11-28 DIAGNOSIS — I429 Cardiomyopathy, unspecified: Secondary | ICD-10-CM | POA: Diagnosis not present

## 2021-11-28 DIAGNOSIS — I5042 Chronic combined systolic (congestive) and diastolic (congestive) heart failure: Secondary | ICD-10-CM | POA: Insufficient documentation

## 2021-11-28 DIAGNOSIS — Z992 Dependence on renal dialysis: Secondary | ICD-10-CM | POA: Diagnosis not present

## 2021-11-28 DIAGNOSIS — I132 Hypertensive heart and chronic kidney disease with heart failure and with stage 5 chronic kidney disease, or end stage renal disease: Secondary | ICD-10-CM | POA: Diagnosis not present

## 2021-11-28 DIAGNOSIS — F17210 Nicotine dependence, cigarettes, uncomplicated: Secondary | ICD-10-CM | POA: Insufficient documentation

## 2021-11-28 DIAGNOSIS — M25552 Pain in left hip: Secondary | ICD-10-CM | POA: Diagnosis present

## 2021-11-28 DIAGNOSIS — Z79899 Other long term (current) drug therapy: Secondary | ICD-10-CM | POA: Diagnosis not present

## 2021-11-28 LAB — CBC WITH DIFFERENTIAL/PLATELET
Abs Immature Granulocytes: 0.01 10*3/uL (ref 0.00–0.07)
Basophils Absolute: 0 10*3/uL (ref 0.0–0.1)
Basophils Relative: 0 %
Eosinophils Absolute: 0.1 10*3/uL (ref 0.0–0.5)
Eosinophils Relative: 2 %
HCT: 38.8 % (ref 36.0–46.0)
Hemoglobin: 11.9 g/dL — ABNORMAL LOW (ref 12.0–15.0)
Immature Granulocytes: 0 %
Lymphocytes Relative: 20 %
Lymphs Abs: 1.1 10*3/uL (ref 0.7–4.0)
MCH: 27.2 pg (ref 26.0–34.0)
MCHC: 30.7 g/dL (ref 30.0–36.0)
MCV: 88.8 fL (ref 80.0–100.0)
Monocytes Absolute: 0.7 10*3/uL (ref 0.1–1.0)
Monocytes Relative: 13 %
Neutro Abs: 3.7 10*3/uL (ref 1.7–7.7)
Neutrophils Relative %: 65 %
Platelets: 310 10*3/uL (ref 150–400)
RBC: 4.37 MIL/uL (ref 3.87–5.11)
RDW: 19 % — ABNORMAL HIGH (ref 11.5–15.5)
WBC: 5.6 10*3/uL (ref 4.0–10.5)
nRBC: 0 % (ref 0.0–0.2)

## 2021-11-28 LAB — BASIC METABOLIC PANEL
Anion gap: 17 — ABNORMAL HIGH (ref 5–15)
BUN: 67 mg/dL — ABNORMAL HIGH (ref 6–20)
CO2: 26 mmol/L (ref 22–32)
Calcium: 9.2 mg/dL (ref 8.9–10.3)
Chloride: 90 mmol/L — ABNORMAL LOW (ref 98–111)
Creatinine, Ser: 11.14 mg/dL — ABNORMAL HIGH (ref 0.44–1.00)
GFR, Estimated: 4 mL/min — ABNORMAL LOW (ref >60)
Glucose, Bld: 79 mg/dL (ref 70–99)
Potassium: 4.3 mmol/L (ref 3.5–5.1)
Sodium: 133 mmol/L — ABNORMAL LOW (ref 135–145)

## 2021-11-28 MED ORDER — KETOROLAC TROMETHAMINE 15 MG/ML IJ SOLN
15.0000 mg | Freq: Once | INTRAMUSCULAR | Status: AC
  Administered 2021-11-28: 15 mg via INTRAVENOUS
  Filled 2021-11-28: qty 1

## 2021-11-28 MED ORDER — HYDROCODONE-ACETAMINOPHEN 5-325 MG PO TABS
1.0000 | ORAL_TABLET | Freq: Once | ORAL | Status: AC
  Administered 2021-11-28: 1 via ORAL
  Filled 2021-11-28: qty 1

## 2021-11-28 MED ORDER — METHYLPREDNISOLONE SODIUM SUCC 125 MG IJ SOLR: 125.0000 mg | Freq: Once | INTRAMUSCULAR | Status: DC

## 2021-11-28 NOTE — ED Provider Notes (Signed)
Emergency Medicine Provider Triage Evaluation Note  KASHARI CHALMERS , a 40 y.o. female  was evaluated in triage.  Pt complains of SHOB, missed dialysis yesterday, last full dialysis Tuesday. Left hip pain, no injury. Dialysis at [facility]  Review of Systems  Positive: SHOB, hip pain Negative: Fever, CP  Physical Exam  BP 130/76 (BP Location: Left Arm)   Pulse 79   Temp 97.8 F (36.6 C) (Oral)   Resp 20   SpO2 94%  Gen:   Awake, no distress   Resp:  Normal effort  MSK:   Moves extremities without difficulty  Other:  Lungs CTA  Medical Decision Making  Medically screening exam initiated at 7:18 AM.  Appropriate orders placed.  CHANIYA GENTER was informed that the remainder of the evaluation will be completed by another provider, this initial triage assessment does not replace that evaluation, and the importance of remaining in the ED until their evaluation is complete.     Tacy Learn, PA-C 11/28/21 [place], [place], DO 11/28/21 [phone]

## 2021-11-28 NOTE — Discharge Instructions (Addendum)
Please follow-up with her routine dialysis tomorrow as scheduled.  Please see a sports medicine physician to discuss left hip joint injections and potential follow-up imaging with an MRI.

## 2021-11-28 NOTE — ED Notes (Signed)
Pt given crutches and demonstrates ability to use them. Verbalizes understanding of d/c instructions, meds and followup care. Denies questions. VSS, no distress noted. W/C to exit with all belongings.

## 2021-11-28 NOTE — ED Triage Notes (Signed)
Patient complains of left hip pain that started a few weeks ago, denies known injury. Reports pain is exacerbated by ambulation. Patient is alert, oriented, and in no apparnet distress at this time. Patient also reports that she missed her dialysis appointment yesterday due to the pain, T,Th,Sat dialysis, last full treatment on Tuesday. Dialysis access in right upper arm.

## 2021-11-28 NOTE — ED Provider Notes (Signed)
[facility] EMERGENCY DEPARTMENT Provider Note   CSN: [id number] Arrival date & time: 11/28/21  2119     History Chief Complaint  Patient presents with   Hip Pain    [name] is a 40 y.o. female.  The history is provided by the patient and medical records.  Leg Pain Location:  Hip Time since incident:  1 week Injury: no   Hip location:  L hip Pain details:    Quality:  Aching   Radiates to:  Does not radiate   Severity:  Moderate   Onset quality:  Gradual   Timing:  Constant   Progression:  Worsening Prior injury to area:  No Relieved by:  Acetaminophen Worsened by:  Bearing weight Associated symptoms: no back pain and no fever       Past Medical History:  Diagnosis Date   Anemia    low iron - receives iron at dialysis   Anxiety    Arthritis    RA   Chronic systolic congestive heart failure (HCC) 03/16/2016   Dyspnea    ESRD (end stage renal disease) ([place])    [facility] _ [place]   H/O pericarditis 01/17/2013   H/O pleural effusion 01/17/2013   Heart murmur    Lupus (systemic lupus erythematosus) ([place])    Previously followed with Dr. 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 pneumonae bacteremia on 01/21/2013. Sensitive to Peniccilin     Patient Active Problem List   Diagnosis Date Noted   Trichomonas vaginitis 03/25/2021   Bacterial vaginitis 03/25/2021   Encounter for screening for COVID-19 01/24/2021   Hemodialysis access, fistula mature ([place]) 01/22/2021   Allergic reaction  01/22/2021   Acute respiratory failure with hypoxia (HCC) 11/02/2020   Acute hyperkalemia 07/28/2020   Nicotine dependence, cigarettes, uncomplicated 41/74/0814   Essential hypertension 07/28/2020   Polysubstance abuse ([place]) 07/28/2020   Hemodialysis catheter malfunction ([place]) 48/18/5631   Complication of AV dialysis fistula, sequela 07/28/2020   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[place]) 05/18/2020   Bacterial intestinal infection, unspecified 12/14/2019   Pain in arm, unspecified 08/15/2019   Cellulitis, unspecified 05/02/2019   Hyperkalemia 07/29/2018   SVT (supraventricular tachycardia) ([place]) 07/16/2018   Cardiomyopathy, unspecified ([place]) 12/03/2016   Diarrhea, unspecified 04/18/2016   Chronic combined systolic (congestive) and diastolic (congestive) heart failure ([place]) 03/16/2016   Other hypervolemia    S/P thoracentesis    Cough with hemoptysis    Myalgia    Pulmonary edema    Dependence on renal dialysis ([place]) 10/15/2015   Rash and nonspecific skin eruption 06/26/2015   Secondary Raynaud's phenomenon 06/24/2015   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   Fever, unspecified 08/28/2014   Pruritus, unspecified 08/28/2014   Headache, unspecified 04/30/2014   Pain, unspecified 04/30/2014   Cramping of hands 04/19/2014   Unspecified contraceptive management 04/19/2014   Mild protein-calorie malnutrition ([place]) 03/22/2014   Insomnia 03/15/2014   Benign hypertension with ESRD (end-stage renal disease) ([place]) 03/15/2014   Tobacco abuse 02/15/2014   Healthcare maintenance 02/15/2014  Iron deficiency anemia, unspecified 02/12/2014   Coagulation defect, unspecified ([place]) 02/10/2014   Anemia in chronic kidney disease 02/09/2014   Secondary hyperparathyroidism of renal origin ([place]) 02/09/2014   Hypoalbuminemia 02/01/2014   Nephrotic syndrome 02/01/2014   ESRD on dialysis (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   S/P pericardiocentesis 01/17/2013   H/O pleural effusion 01/17/2013   Nephrosis 01/17/2013   Preseptal cellulitis 01/17/2013   Cardiac tamponade 12/29/2004   Pericardial effusion (noninflammatory) 12/29/2004    Past Surgical History:  Procedure Laterality Date   AV FISTULA PLACEMENT     AV FISTULA PLACEMENT Right 08/01/2020   Procedure: RIGHT ARM BRACHIOCEPHALIC  ARTERIOVENOUS (AV) FISTULA CREATION;  Surgeon: [name], MD;  Location: [place];  Service: Vascular;  Laterality: Right;   [place] Left 02/05/2014   Procedure: [place];  Surgeon: [name], MD;  Location: [place];  Service: Vascular;  Laterality: Left;   [place] Right 01/08/2021   Procedure: RIGHT ARM SECOND STAGE [place];  Surgeon: [name], MD;  Location: [place];  Service: Vascular;  Laterality: Right;   FISTULA SUPERFICIALIZATION Left 05/30/2018   Procedure: FISTULA PLICATION BASILIC VEIN TRANSPOSITION;  Surgeon: Angelia Mould, MD;  Location: [place];  Service: Vascular;  Laterality: Left;   FISTULA SUPERFICIALIZATION Left 62/22/9798   Procedure: PLICATION OF LEFT ARTERIOVENOUS FISTULA ULCER;  Surgeon: [name], MD;  Location: [place];  Service: Vascular;  Laterality: Left;   I & D EXTREMITY Right 02/18/2021   Procedure: IRRIGATION AND DEBRIDEMENT OF ARM;  Surgeon: Angelia Mould, MD;  Location: [place];  Service: Vascular;  Laterality: Right;   INSERTION OF DIALYSIS CATHETER N/A 05/19/2020   Procedure: TUNNELED INSERTION  OF DIALYSIS CATHETER;  Surgeon: Waynetta Sandy, MD;  Location: [place];  Service: Vascular;  Laterality: N/A;   THROMBECTOMY AND REVISION OF ARTERIOVENTOUS (AV) GORETEX  GRAFT Left 07/28/2020   Procedure: Oversewing of left arm Brachial cephalic fistula for bleeding.;   Surgeon: Angelia Mould, MD;  Location: [place];  Service: Vascular;  Laterality: Left;   VENOGRAM Right 01/31/2014   Procedure: DIALYSIS CATHETER;  Surgeon: Serafina Mitchell, MD;  Location: [facility] CATH LAB;  Service: Cardiovascular;  Laterality: Right;     OB History   No obstetric history on file.     No family history on file.  Social History   Tobacco Use   Smoking status: Every Day    Packs/day: 0.12    Years: 15.00    Pack years: 1.80    Types: Cigarettes   Smokeless tobacco: Never   Tobacco comments:    4 cigarettes per day  Vaping Use   Vaping Use: Never used  Substance Use Topics   Alcohol use: Yes    Alcohol/week: 0.0 standard drinks    Comment: Special Occasional takes Vicar   Drug use: Yes    Types: Marijuana    Comment: ocassional, last time- 12/21/20    Home Medications Prior to Admission medications   Medication Sig Start Date End Date Taking? Authorizing Provider  acetaminophen (TYLENOL) 650 MG CR tablet Take 1,300 mg by mouth every 8 (eight) hours as needed for pain.    [provider]  calcitRIOL (ROCALTROL) 0.5 MCG capsule  Take 4 capsules (2 mcg total) by mouth Every Tuesday,Thursday,and Saturday with dialysis. 11/07/20   Hosie Poisson, MD  calcium acetate (PHOSLO) 667 MG capsule Take 1,334-2,668 mg by mouth See admin instructions. Take 4 capsule (2668 mg) by mouth three times a day with each meal and 2 capsules (1334 mg) with each snack    [provider]  carvedilol (COREG) 12.5 MG tablet Take 12.5 mg by mouth 2 (two) times daily with a meal.  11/07/19   [provider]  cinacalcet (SENSIPAR) 30 MG tablet Take 30 mg by mouth as needed (calcium reducer).    [provider]  diphenhydrAMINE (BENADRYL) 25 MG tablet Take 1 tablet (25 mg total) by mouth every 4 (four) hours as needed for itching or allergies. 01/23/21   Lavina Hamman, MD  doxercalciferol (HECTOROL) 4 MCG/2ML injection Use as recommended by nephrology  11/05/20   Hosie Poisson, MD  hydrOXYzine (ATARAX/VISTARIL) 10 MG tablet Take 1 tablet (10 mg total) by mouth 3 (three) times daily as needed for anxiety. Patient taking differently: Take 10 mg by mouth 3 (three) times daily as needed for itching or anxiety. 07/29/19   Caccavale, Sophia, PA-C  loperamide (IMODIUM A-D) 2 MG tablet Take 4 mg by mouth daily as needed for diarrhea or loose stools.     [provider]  multivitamin (RENA-VIT) TABS tablet Take 1 tablet by mouth daily.    [provider]  Nutritional Supplements (NOVASOURCE RENAL) LIQD Take 237 mLs by mouth See admin instructions. Take 1 container (237 mls) by at bedtime on dialysis days (Tuesday, Thursday, Saturday)    [provider]  sevelamer (RENAGEL) 800 MG tablet Take by mouth 3 (three) times daily with meals.    [provider]    Allergies    Cephalosporins and Tobramycin sulfate  Review of Systems   Review of Systems  Constitutional:  Negative for chills and fever.  HENT:  Negative for ear pain and sore throat.   Eyes:  Negative for pain and visual disturbance.  Respiratory:  Negative for cough and shortness of breath.   Cardiovascular:  Negative for chest pain and palpitations.  Gastrointestinal:  Negative for abdominal pain and vomiting.  Genitourinary:  Negative for dysuria and hematuria.  Musculoskeletal:  Negative for arthralgias and back pain.  Skin:  Negative for color change and rash.  Neurological:  Negative for seizures and syncope.  All other systems reviewed and are negative.  Physical Exam Updated Vital Signs BP 135/70 (BP Location: Right Arm)   Pulse 95   Temp 98.1 F (36.7 C) (Oral)   Resp 16   SpO2 99%   Physical Exam Vitals and nursing note reviewed.  Constitutional:      General: She is not in acute distress.    Appearance: Normal appearance. She is well-developed.  HENT:     Head: Normocephalic and atraumatic.     Right Ear: External ear normal.      Left Ear: External ear normal.     Nose: Nose normal. No congestion or rhinorrhea.     Mouth/Throat:     Mouth: Mucous membranes are moist.  Eyes:     Extraocular Movements: Extraocular movements intact.     Conjunctiva/sclera: Conjunctivae normal.     Pupils: Pupils are equal, round, and reactive to light.  Cardiovascular:     Rate and Rhythm: Normal rate and regular rhythm.     Pulses: Normal pulses.     Heart sounds: No murmur heard.  Pulmonary:     Effort: Pulmonary effort is normal. No respiratory distress.     Breath sounds: Normal breath sounds. No wheezing, rhonchi or rales.  Abdominal:     General: Abdomen is flat. Bowel sounds are normal.     Palpations: Abdomen is soft.     Tenderness: There is no abdominal tenderness. There is no guarding or rebound.  Musculoskeletal:        General: No swelling, tenderness or deformity.     Cervical back: Normal range of motion and neck supple. No rigidity.     Comments: Left groin and lateral hip tenderness to palpation, worse with flexion and internal rotation of the lower extremity.  She is neurovascular intact distally.  Has full range of motion at the knee and ankle.  The overlying wounds, abrasions, wounds, swelling, erythema.  Skin:    General: Skin is warm and dry.     Capillary Refill: Capillary refill takes less than 2 seconds.  Neurological:     General: No focal deficit present.     Mental Status: She is alert and oriented to person, place, and time.  Psychiatric:        Mood and Affect: Mood normal.    ED Results / Procedures / Treatments   Labs (all labs ordered are listed, but only abnormal results are displayed) Labs Reviewed  BASIC METABOLIC PANEL - Abnormal; Notable for the following components:      Result Value   Sodium 133 (*)    Chloride 90 (*)    BUN 67 (*)    Creatinine, Ser 11.14 (*)    GFR, Estimated 4 (*)    Anion gap 17 (*)    All other components within normal limits  CBC WITH  DIFFERENTIAL/PLATELET - Abnormal; Notable for the following components:   Hemoglobin 11.9 (*)    RDW 19.0 (*)    All other components within normal limits    EKG EKG Interpretation  Date/Time:  Friday November 28 2021 06:08:32 EST Ventricular Rate:  102 PR Interval:  266 QRS Duration: 86 QT Interval:  324 QTC Calculation: 422 R Axis:   92 Text Interpretation: Sinus tachycardia with 1st degree A-V block Biatrial enlargement Rightward axis Pulmonary disease pattern Biventricular hypertrophy Nonspecific T wave abnormality Abnormal ECG hyperacute T waves Confirmed by Ripley Fraise [phone]) on 11/28/2021 6:15:10 AM  Radiology DG Chest Port 1 View  Result Date: 11/28/2021 CLINICAL DATA:  40 year old female with shortness of breath and left rib pain. Missed dialysis. EXAM: PORTABLE CHEST 1 VIEW COMPARISON:  Portable chest 02/02/2021 and earlier. FINDINGS: Portable AP semi upright view at 0655 hours. Left chest Port-A-Cath has been removed. Stable cardiomegaly and mediastinal contours. Visualized tracheal air column is within normal limits. Improved lung volumes. Small pleural effusions appear regressed but not resolved. Pulmonary vascular congestion not significantly changed from last year. Trace pleural fluid in the right minor fissure. No consolidation. Left axillary surgical clips. Negative visible bowel gas. No acute osseous abnormality identified. IMPRESSION: Chronic cardiomegaly and small pleural effusions with chronic pulmonary vascular congestion. No overt edema suspected. Electronically Signed   By: Genevie Ann M.D.   On: 11/28/2021 07:19   DG Hip Unilat W or Wo Pelvis 2-3 Views Left  Result Date: 11/28/2021 CLINICAL DATA:  Left hip pain EXAM: DG HIP (WITH OR WITHOUT PELVIS) 2-3V LEFT COMPARISON:  None. FINDINGS: There is no evidence of hip fracture or dislocation. There is no evidence of arthropathy or other focal bone abnormality. Age advanced  vascular calcifications. IMPRESSION: Negative.  Electronically Signed   By: Macy Mis M.D.   On: 11/28/2021 18:01    Procedures Procedures   Medications Ordered in ED Medications  HYDROcodone-acetaminophen (NORCO/VICODIN) 5-325 MG per tablet 1 tablet (1 tablet Oral Given 11/28/21 1641)  ketorolac (TORADOL) 15 MG/ML injection 15 mg (15 mg Intravenous Given 11/28/21 1846)    ED Course  I have reviewed the triage vital signs and the nursing notes.  Pertinent labs & imaging results that were available during my care of the patient were reviewed by me and considered in my medical decision making (see chart for details).    MDM Rules/Calculators/A&P                          40 year old female with progressive atraumatic left hip pain as above.  She is afebrile and hemodynamically stable.  She is nontoxic-appearing.  White count normal.  Low concern for septic arthritis.  She denies recent viral illness.  Low concern for transient synovitis.  May have small labral tear versus arthritis versus other ligamentous injury.  X-rays obtained.  No evidence of effusion.  No signs of avascular necrosis.  Patient given toradol for sxs control. Appropriate for discharge home with close follow-up with sports medicine team to discuss L hip injection and possible MRI. Encouraged symptomatic management at home. Crutches provided for mobility.   She did miss dialysis yesterday because of the hip pain and inability to walk.  She is setting appropriately on room air.  No signs of respiratory distress.  Chest x-ray showed no interstitial edema.  Potassium reassuring.  No indications for emergent dialysis at this time.  Patient to follow-up tomorrow morning for routine dialysis as scheduled.  Final Clinical Impression(s) / ED Diagnoses Final diagnoses:  Left hip pain    Rx / DC Orders ED Discharge Orders     None        Idamae Lusher, MD 11/28/21 [place], Ankit, MD 11/28/21 2118

## 2021-12-15 ENCOUNTER — Encounter (HOSPITAL_COMMUNITY): Payer: Self-pay | Admitting: *Deleted

## 2021-12-15 ENCOUNTER — Other Ambulatory Visit: Payer: Self-pay

## 2021-12-15 ENCOUNTER — Inpatient Hospital Stay (HOSPITAL_COMMUNITY)
Admission: EM | Admit: 2021-12-15 | Discharge: 2021-12-18 | DRG: 640 | Disposition: A | Discharge status: Home / Self Care | Payer: Medicaid Other | Attending: Family Medicine | Admitting: Family Medicine

## 2021-12-15 DIAGNOSIS — I3481 Nonrheumatic mitral (valve) annulus calcification: Secondary | ICD-10-CM | POA: Diagnosis present

## 2021-12-15 DIAGNOSIS — K449 Diaphragmatic hernia without obstruction or gangrene: Secondary | ICD-10-CM | POA: Diagnosis present

## 2021-12-15 DIAGNOSIS — N186 End stage renal disease: Secondary | ICD-10-CM

## 2021-12-15 DIAGNOSIS — F419 Anxiety disorder, unspecified: Secondary | ICD-10-CM | POA: Diagnosis present

## 2021-12-15 DIAGNOSIS — M069 Rheumatoid arthritis, unspecified: Secondary | ICD-10-CM | POA: Diagnosis present

## 2021-12-15 DIAGNOSIS — D509 Iron deficiency anemia, unspecified: Secondary | ICD-10-CM | POA: Diagnosis present

## 2021-12-15 DIAGNOSIS — G4700 Insomnia, unspecified: Secondary | ICD-10-CM | POA: Diagnosis present

## 2021-12-15 DIAGNOSIS — M329 Systemic lupus erythematosus, unspecified: Secondary | ICD-10-CM | POA: Diagnosis present

## 2021-12-15 DIAGNOSIS — Z862 Personal history of diseases of the blood and blood-forming organs and certain disorders involving the immune mechanism: Secondary | ICD-10-CM

## 2021-12-15 DIAGNOSIS — E876 Hypokalemia: Secondary | ICD-10-CM | POA: Diagnosis not present

## 2021-12-15 DIAGNOSIS — R636 Underweight: Secondary | ICD-10-CM | POA: Diagnosis present

## 2021-12-15 DIAGNOSIS — Z79899 Other long term (current) drug therapy: Secondary | ICD-10-CM

## 2021-12-15 DIAGNOSIS — Z881 Allergy status to other antibiotic agents status: Secondary | ICD-10-CM

## 2021-12-15 DIAGNOSIS — R195 Other fecal abnormalities: Secondary | ICD-10-CM | POA: Diagnosis not present

## 2021-12-15 DIAGNOSIS — K922 Gastrointestinal hemorrhage, unspecified: Secondary | ICD-10-CM

## 2021-12-15 DIAGNOSIS — M3214 Glomerular disease in systemic lupus erythematosus: Secondary | ICD-10-CM | POA: Diagnosis present

## 2021-12-15 DIAGNOSIS — E875 Hyperkalemia: Secondary | ICD-10-CM | POA: Diagnosis not present

## 2021-12-15 DIAGNOSIS — K2970 Gastritis, unspecified, without bleeding: Secondary | ICD-10-CM | POA: Diagnosis present

## 2021-12-15 DIAGNOSIS — F17210 Nicotine dependence, cigarettes, uncomplicated: Secondary | ICD-10-CM | POA: Diagnosis present

## 2021-12-15 DIAGNOSIS — Z20822 Contact with and (suspected) exposure to covid-19: Secondary | ICD-10-CM | POA: Diagnosis present

## 2021-12-15 DIAGNOSIS — K920 Hematemesis: Secondary | ICD-10-CM | POA: Diagnosis not present

## 2021-12-15 DIAGNOSIS — Z992 Dependence on renal dialysis: Secondary | ICD-10-CM

## 2021-12-15 DIAGNOSIS — Z9115 Patient's noncompliance with renal dialysis: Secondary | ICD-10-CM

## 2021-12-15 DIAGNOSIS — I132 Hypertensive heart and chronic kidney disease with heart failure and with stage 5 chronic kidney disease, or end stage renal disease: Secondary | ICD-10-CM | POA: Diagnosis present

## 2021-12-15 DIAGNOSIS — Z3201 Encounter for pregnancy test, result positive: Secondary | ICD-10-CM | POA: Diagnosis present

## 2021-12-15 DIAGNOSIS — Z681 Body mass index (BMI) 19 or less, adult: Secondary | ICD-10-CM

## 2021-12-15 DIAGNOSIS — K259 Gastric ulcer, unspecified as acute or chronic, without hemorrhage or perforation: Secondary | ICD-10-CM | POA: Diagnosis present

## 2021-12-15 DIAGNOSIS — R739 Hyperglycemia, unspecified: Secondary | ICD-10-CM | POA: Diagnosis present

## 2021-12-15 DIAGNOSIS — I10 Essential (primary) hypertension: Secondary | ICD-10-CM | POA: Diagnosis present

## 2021-12-15 DIAGNOSIS — R197 Diarrhea, unspecified: Secondary | ICD-10-CM | POA: Diagnosis present

## 2021-12-15 DIAGNOSIS — N2581 Secondary hyperparathyroidism of renal origin: Secondary | ICD-10-CM | POA: Diagnosis present

## 2021-12-15 DIAGNOSIS — E8729 Other acidosis: Secondary | ICD-10-CM | POA: Diagnosis present

## 2021-12-15 DIAGNOSIS — I5022 Chronic systolic (congestive) heart failure: Secondary | ICD-10-CM | POA: Diagnosis present

## 2021-12-15 DIAGNOSIS — E871 Hypo-osmolality and hyponatremia: Secondary | ICD-10-CM | POA: Diagnosis present

## 2021-12-15 DIAGNOSIS — D631 Anemia in chronic kidney disease: Secondary | ICD-10-CM | POA: Diagnosis present

## 2021-12-15 LAB — RENAL FUNCTION PANEL
Albumin: 2.4 g/dL — ABNORMAL LOW (ref 3.5–5.0)
Albumin: 2.5 g/dL — ABNORMAL LOW (ref 3.5–5.0)
Albumin: 2.4 g/dL — ABNORMAL LOW (ref 3.5–5.0)
Anion gap: 23 — ABNORMAL HIGH (ref 5–15)
Anion gap: 12 (ref 5–15)
Anion gap: 13 (ref 5–15)
BUN: 121 mg/dL — ABNORMAL HIGH (ref 6–20)
BUN: 23 mg/dL — ABNORMAL HIGH (ref 6–20)
BUN: 29 mg/dL — ABNORMAL HIGH (ref 6–20)
CO2: 18 mmol/L — ABNORMAL LOW (ref 22–32)
CO2: 24 mmol/L (ref 22–32)
CO2: 24 mmol/L (ref 22–32)
Calcium: 8.3 mg/dL — ABNORMAL LOW (ref 8.9–10.3)
Calcium: 8.4 mg/dL — ABNORMAL LOW (ref 8.9–10.3)
Calcium: 8.6 mg/dL — ABNORMAL LOW (ref 8.9–10.3)
Chloride: 95 mmol/L — ABNORMAL LOW (ref 98–111)
Chloride: 96 mmol/L — ABNORMAL LOW (ref 98–111)
Chloride: 95 mmol/L — ABNORMAL LOW (ref 98–111)
Creatinine, Ser: 13.51 mg/dL — ABNORMAL HIGH (ref 0.44–1.00)
Creatinine, Ser: 3.82 mg/dL — ABNORMAL HIGH (ref 0.44–1.00)
Creatinine, Ser: 5.59 mg/dL — ABNORMAL HIGH (ref 0.44–1.00)
GFR, Estimated: 3 mL/min — ABNORMAL LOW (ref >60)
GFR, Estimated: 15 mL/min — ABNORMAL LOW (ref >60)
GFR, Estimated: 9 mL/min — ABNORMAL LOW (ref >60)
Glucose, Bld: 70 mg/dL (ref 70–99)
Glucose, Bld: 83 mg/dL (ref 70–99)
Glucose, Bld: 75 mg/dL (ref 70–99)
Phosphorus: >30 mg/dL — ABNORMAL HIGH (ref 2.5–4.6)
Phosphorus: 3.8 mg/dL (ref 2.5–4.6)
Phosphorus: 6.2 mg/dL — ABNORMAL HIGH (ref 2.5–4.6)
Potassium: 6.6 mmol/L (ref 3.5–5.1)
Potassium: 3.1 mmol/L — ABNORMAL LOW (ref 3.5–5.1)
Potassium: 3.8 mmol/L (ref 3.5–5.1)
Sodium: 136 mmol/L (ref 135–145)
Sodium: 132 mmol/L — ABNORMAL LOW (ref 135–145)
Sodium: 132 mmol/L — ABNORMAL LOW (ref 135–145)

## 2021-12-15 LAB — TYPE AND SCREEN
ABO/RH(D): A POS
Antibody Screen: NEGATIVE

## 2021-12-15 LAB — COMPREHENSIVE METABOLIC PANEL
ALT: 18 U/L (ref 0–44)
AST: 25 U/L (ref 15–41)
Albumin: 2.6 g/dL — ABNORMAL LOW (ref 3.5–5.0)
Alkaline Phosphatase: 110 U/L (ref 38–126)
Anion gap: 27 — ABNORMAL HIGH (ref 5–15)
BUN: 117 mg/dL — ABNORMAL HIGH (ref 6–20)
CO2: 14 mmol/L — ABNORMAL LOW (ref 22–32)
Calcium: 8.4 mg/dL — ABNORMAL LOW (ref 8.9–10.3)
Chloride: 94 mmol/L — ABNORMAL LOW (ref 98–111)
Creatinine, Ser: 13.6 mg/dL — ABNORMAL HIGH (ref 0.44–1.00)
GFR, Estimated: 3 mL/min — ABNORMAL LOW (ref >60)
Glucose, Bld: 92 mg/dL (ref 70–99)
Potassium: >7.5 mmol/L (ref 3.5–5.1)
Sodium: 135 mmol/L (ref 135–145)
Total Bilirubin: 0.8 mg/dL (ref 0.3–1.2)
Total Protein: 9.2 g/dL — ABNORMAL HIGH (ref 6.5–8.1)

## 2021-12-15 LAB — CBC
HCT: 36.2 % (ref 36.0–46.0)
Hemoglobin: 11.2 g/dL — ABNORMAL LOW (ref 12.0–15.0)
MCH: 26.5 pg (ref 26.0–34.0)
MCHC: 30.9 g/dL (ref 30.0–36.0)
MCV: 85.8 fL (ref 80.0–100.0)
Platelets: 345 10*3/uL (ref 150–400)
RBC: 4.22 MIL/uL (ref 3.87–5.11)
RDW: 19.9 % — ABNORMAL HIGH (ref 11.5–15.5)
WBC: 5.5 10*3/uL (ref 4.0–10.5)
nRBC: 1.3 % — ABNORMAL HIGH (ref 0.0–0.2)

## 2021-12-15 LAB — CBC WITH DIFFERENTIAL/PLATELET
Abs Immature Granulocytes: 0.02 10*3/uL (ref 0.00–0.07)
Basophils Absolute: 0 10*3/uL (ref 0.0–0.1)
Basophils Relative: 0 %
Eosinophils Absolute: 0 10*3/uL (ref 0.0–0.5)
Eosinophils Relative: 0 %
HCT: 40.8 % (ref 36.0–46.0)
Hemoglobin: 12.5 g/dL (ref 12.0–15.0)
Immature Granulocytes: 0 %
Lymphocytes Relative: 25 %
Lymphs Abs: 1.3 10*3/uL (ref 0.7–4.0)
MCH: 26.8 pg (ref 26.0–34.0)
MCHC: 30.6 g/dL (ref 30.0–36.0)
MCV: 87.4 fL (ref 80.0–100.0)
Monocytes Absolute: 0.4 10*3/uL (ref 0.1–1.0)
Monocytes Relative: 8 %
Neutro Abs: 3.4 10*3/uL (ref 1.7–7.7)
Neutrophils Relative %: 67 %
Platelets: 363 10*3/uL (ref 150–400)
RBC: 4.67 MIL/uL (ref 3.87–5.11)
RDW: 20 % — ABNORMAL HIGH (ref 11.5–15.5)
WBC: 5.1 10*3/uL (ref 4.0–10.5)
nRBC: 1.4 % — ABNORMAL HIGH (ref 0.0–0.2)

## 2021-12-15 LAB — HEPATITIS B SURFACE ANTIBODY,QUALITATIVE: Hep B S Ab: REACTIVE — AB

## 2021-12-15 LAB — PROTIME-INR
INR: 1.4 — ABNORMAL HIGH (ref 0.8–1.2)
Prothrombin Time: 16.7 seconds — ABNORMAL HIGH (ref 11.4–15.2)

## 2021-12-15 LAB — RESP PANEL BY RT-PCR (FLU A&B, COVID) ARPGX2
Influenza A by PCR: NEGATIVE
Influenza B by PCR: NEGATIVE
SARS Coronavirus 2 by RT PCR: NEGATIVE

## 2021-12-15 LAB — HEPATITIS B SURFACE ANTIGEN: Hepatitis B Surface Ag: NONREACTIVE

## 2021-12-15 LAB — MAGNESIUM: Magnesium: 2.9 mg/dL — ABNORMAL HIGH (ref 1.7–2.4)

## 2021-12-15 LAB — HIV ANTIBODY (ROUTINE TESTING W REFLEX): HIV Screen 4th Generation wRfx: NONREACTIVE

## 2021-12-15 MED ORDER — ALBUTEROL SULFATE (2.5 MG/3ML) 0.083% IN NEBU
10.0000 mg | INHALATION_SOLUTION | Freq: Once | RESPIRATORY_TRACT | Status: DC
  Filled 2021-12-15: qty 12

## 2021-12-15 MED ORDER — ONDANSETRON HCL 4 MG/2ML IJ SOLN
4.0000 mg | Freq: Once | INTRAMUSCULAR | Status: AC
  Administered 2021-12-15: 13:00:00 4 mg via INTRAVENOUS
  Filled 2021-12-15: qty 2

## 2021-12-15 MED ORDER — ALTEPLASE 2 MG IJ SOLR
2.0000 mg | Freq: Once | INTRAMUSCULAR | Status: DC | PRN
  Filled 2021-12-15: qty 2

## 2021-12-15 MED ORDER — HEPARIN SODIUM (PORCINE) 1000 UNIT/ML DIALYSIS
1000.0000 [IU] | INTRAMUSCULAR | Status: DC | PRN
  Filled 2021-12-15: qty 1

## 2021-12-15 MED ORDER — HEPARIN SODIUM (PORCINE) 1000 UNIT/ML DIALYSIS: 4000.0000 [IU] | Freq: Once | INTRAMUSCULAR | Status: DC

## 2021-12-15 MED ORDER — ACETAMINOPHEN 325 MG PO TABS: 650.0000 mg | ORAL_TABLET | Freq: Three times a day (TID) | ORAL | Status: DC | PRN

## 2021-12-15 MED ORDER — SODIUM BICARBONATE 8.4 % IV SOLN
50.0000 meq | Freq: Once | INTRAVENOUS | Status: AC
  Administered 2021-12-15: 13:00:00 50 meq via INTRAVENOUS
  Filled 2021-12-15: qty 50

## 2021-12-15 MED ORDER — SODIUM CHLORIDE 0.9 % IV BOLUS
500.0000 mL | Freq: Once | INTRAVENOUS | Status: AC
  Administered 2021-12-15: 13:00:00 1000 mL via INTRAVENOUS

## 2021-12-15 MED ORDER — LIDOCAINE-PRILOCAINE 2.5-2.5 % EX CREA
1.0000 "application " | TOPICAL_CREAM | CUTANEOUS | Status: DC | PRN
  Filled 2021-12-15: qty 5

## 2021-12-15 MED ORDER — SODIUM CHLORIDE 0.9 % IV SOLN: 100.0000 mL | INTRAVENOUS | Status: DC | PRN

## 2021-12-15 MED ORDER — CARVEDILOL 12.5 MG PO TABS
12.5000 mg | ORAL_TABLET | Freq: Two times a day (BID) | ORAL | Status: DC
  Administered 2021-12-16 – 2021-12-18 (×4): 12.5 mg via ORAL
  Filled 2021-12-15 (×4): qty 1

## 2021-12-15 MED ORDER — DEXTROSE 50 % IV SOLN
1.0000 | Freq: Once | INTRAVENOUS | Status: AC
  Administered 2021-12-15: 13:00:00 50 mL via INTRAVENOUS
  Filled 2021-12-15: qty 50

## 2021-12-15 MED ORDER — INSULIN ASPART 100 UNIT/ML IV SOLN
5.0000 [IU] | Freq: Once | INTRAVENOUS | Status: AC
  Administered 2021-12-15: 13:00:00 5 [IU] via INTRAVENOUS

## 2021-12-15 MED ORDER — PANTOPRAZOLE SODIUM 40 MG IV SOLR
40.0000 mg | Freq: Two times a day (BID) | INTRAVENOUS | Status: DC
  Administered 2021-12-15 – 2021-12-17 (×4): 40 mg via INTRAVENOUS
  Filled 2021-12-15 (×5): qty 40

## 2021-12-15 MED ORDER — LIDOCAINE HCL (PF) 1 % IJ SOLN: 5.0000 mL | INTRAMUSCULAR | Status: DC | PRN

## 2021-12-15 MED ORDER — PENTAFLUOROPROP-TETRAFLUOROETH EX AERO
1.0000 "application " | INHALATION_SPRAY | CUTANEOUS | Status: DC | PRN
  Filled 2021-12-15: qty 116

## 2021-12-15 MED ORDER — SODIUM CHLORIDE 0.9 % IV BOLUS: 1000.0000 mL | Freq: Once | INTRAVENOUS | Status: DC

## 2021-12-15 MED ORDER — CALCIUM GLUCONATE 10 % IV SOLN
1.0000 g | Freq: Once | INTRAVENOUS | Status: AC
  Administered 2021-12-15: 16:00:00 1 g via INTRAVENOUS
  Filled 2021-12-15: qty 10

## 2021-12-15 MED ORDER — CHLORHEXIDINE GLUCONATE CLOTH 2 % EX PADS
6.0000 | MEDICATED_PAD | Freq: Every day | CUTANEOUS | Status: DC
  Administered 2021-12-16 – 2021-12-18 (×2): 6 via TOPICAL

## 2021-12-15 MED ORDER — PANTOPRAZOLE SODIUM 40 MG IV SOLR
40.0000 mg | Freq: Once | INTRAVENOUS | Status: AC
  Administered 2021-12-15: 13:00:00 40 mg via INTRAVENOUS
  Filled 2021-12-15: qty 40

## 2021-12-15 MED ORDER — SODIUM CHLORIDE 0.9 % IV SOLN
100.0000 mL | INTRAVENOUS | Status: DC | PRN
  Administered 2021-12-17: 14:00:00 1000 mL via INTRAVENOUS

## 2021-12-15 MED ORDER — CALCIUM GLUCONATE 10 % IV SOLN
1.0000 g | Freq: Once | INTRAVENOUS | Status: AC
  Administered 2021-12-15: 13:00:00 1 g via INTRAVENOUS
  Filled 2021-12-15: qty 10

## 2021-12-15 NOTE — Consult Note (Addendum)
[facility] Renal Consultation Note    Indication for Consultation:  Management of ESRD/hemodialysis; anemia, hypertension/volume and secondary hyperparathyroidism  JJO:ACZYSAYT, Amy J, NP  HPI: [name] is a 40 y.o. female with ESRD on HD TTS at [facility]. She has a past medical history significant for CHF, lupus nephritis, pericardiocentesis d/t pericarditis and tamponade in 2006, polysubstance abuse, and medical noncompliance. She presents to the ED c/o ongoing N/V over the weekend which has worsened today. She was unable to complete her scheduled HD d/t this issue in addition to her R arm becoming infiltrated-appears that infiltration has resolved. GI consulted. Last HD was on 12/11/21. Current labs shows: K+ 7.5, BUN 117, SrCr 13.6, Ca 8.4, Mg 2.9, and Hgb 12.5. Patient has received amp bicarb, insulin, and Ca Gluconate. She reports SOB with exertion. Noted HR ranging between 120-170s. She denies CP and palpitations and currently not in respiratory distress. BP also elevated. Plan for HD today.  Past Medical History:  Diagnosis Date   Anemia    low iron - receives iron at dialysis   Anxiety    Arthritis    RA   Chronic systolic congestive heart failure (HCC) 03/16/2016   Dyspnea    ESRD (end stage renal disease) ([place])    [place] [facility] _ [place]   H/O pericarditis 01/17/2013   H/O pleural effusion 01/17/2013   Heart murmur    Lupus (systemic lupus erythematosus) (HCC)    Previously followed with Dr. Charlestine Night, has not followed up recently   Lupus nephritis [facility]) 2006   Renal biopsy shows segmental endocapillary proliferation and cellular crescent formation (Class IIIA) and lupus membranous glomerulopathy (Class V, stage II)   Pneumonia    many times   Polysubstance abuse ([place])    cocaine, MJ, tobacco   S/P pericardiocentesis 01/17/2013   H/o pericardial effusion with tamponade 2006    Seizures ([place])    during pregnancy 1 time    Streptococcal bacteremia 01/23/2013   She had two S. pneumonae bacteremia on 01/21/2013. Sensitive to Peniccilin    Past Surgical History:  Procedure Laterality Date   AV FISTULA PLACEMENT     AV FISTULA PLACEMENT Right 08/01/2020   Procedure: RIGHT ARM BRACHIOCEPHALIC  ARTERIOVENOUS (AV) FISTULA CREATION;  Surgeon: [name], MD;  Location: [place];  Service: Vascular;  Laterality: Right;   [place] Left 02/05/2014   Procedure: [place];  Surgeon: [name], MD;  Location: [place];  Service: Vascular;  Laterality: Left;   [place] Right 01/08/2021   Procedure: RIGHT ARM SECOND STAGE [place];  Surgeon: [name], MD;  Location: [place];  Service: Vascular;  Laterality: Right;   FISTULA SUPERFICIALIZATION Left 05/30/2018   Procedure: FISTULA PLICATION BASILIC VEIN TRANSPOSITION;  Surgeon: Angelia Mould, MD;  Location: [place];  Service: Vascular;  Laterality: Left;   FISTULA SUPERFICIALIZATION Left 01/60/1093   Procedure: PLICATION OF LEFT ARTERIOVENOUS FISTULA ULCER;  Surgeon: [name], MD;  Location: [place];  Service: Vascular;  Laterality: Left;   I & D EXTREMITY Right 02/18/2021   Procedure: IRRIGATION AND DEBRIDEMENT OF ARM;  Surgeon: Angelia Mould, MD;  Location: [place];  Service: Vascular;  Laterality: Right;   INSERTION OF DIALYSIS CATHETER N/A 05/19/2020   Procedure: TUNNELED INSERTION  OF DIALYSIS CATHETER;  Surgeon: Waynetta Sandy, MD;  Location: [place];  Service: Vascular;  Laterality: N/A;   THROMBECTOMY AND REVISION OF ARTERIOVENTOUS (  AV) GORETEX  GRAFT Left 07/28/2020   Procedure: Oversewing of left arm Brachial cephalic fistula for bleeding.;  Surgeon: Angelia Mould, MD;  Location: [place];  Service: Vascular;  Laterality: Left;   VENOGRAM Right 01/31/2014   Procedure: DIALYSIS CATHETER;  Surgeon: Serafina Mitchell, MD;  Location: [facility] CATH LAB;  Service: Cardiovascular;   Laterality: Right;   No family history on file. Social History:  reports that she has been smoking cigarettes. She has a 1.80 pack-year smoking history. She has never used smokeless tobacco. She reports current alcohol use. She reports current drug use. Drug: Marijuana. Allergies  Allergen Reactions   Cephalosporins Rash    To both keflex and cefazolin   Tobramycin Sulfate Swelling    Eye swelling   Prior to Admission medications   Medication Sig Start Date End Date Taking? Authorizing Provider  acetaminophen (TYLENOL) 650 MG CR tablet Take 1,300 mg by mouth every 8 (eight) hours as needed for pain.   Yes [provider]  calcium acetate (PHOSLO) 667 MG capsule Take 1,334-2,668 mg by mouth See admin instructions. Take 4 capsule (2668 mg) by mouth three times a day with each meal and 2 capsules (1334 mg) with each snack   Yes [provider]  carvedilol (COREG) 12.5 MG tablet Take 12.5 mg by mouth 2 (two) times daily with a meal.  11/07/19  Yes [provider]  diphenhydrAMINE (BENADRYL) 25 MG tablet Take 1 tablet (25 mg total) by mouth every 4 (four) hours as needed for itching or allergies. 01/23/21  Yes Lavina Hamman, MD  doxercalciferol (HECTOROL) 4 MCG/2ML injection Use as recommended by nephrology 11/05/20  Yes Hosie Poisson, MD  loperamide (IMODIUM A-D) 2 MG tablet Take 4 mg by mouth daily as needed for diarrhea or loose stools.    Yes [provider]  multivitamin (RENA-VIT) TABS tablet Take 1 tablet by mouth daily.   Yes [provider]  Nutritional Supplements (NOVASOURCE RENAL) LIQD Take 237 mLs by mouth See admin instructions. Take 1 container (237 mls) by at bedtime on dialysis days (Tuesday, Thursday, Saturday)   Yes [provider]  RENVELA 800 MG tablet Take 800 mg by mouth 3 (three) times daily with meals. 12/02/21  Yes [provider]  calcitRIOL (ROCALTROL) 0.5 MCG capsule Take 4 capsules (2 mcg total) by mouth  Every Tuesday,Thursday,and Saturday with dialysis. Patient not taking: Reported on 12/15/2021 11/07/20   Hosie Poisson, MD  hydrOXYzine (ATARAX/VISTARIL) 10 MG tablet Take 1 tablet (10 mg total) by mouth 3 (three) times daily as needed for anxiety. Patient not taking: Reported on 12/15/2021 07/29/19   Caccavale, Sophia, PA-C   Current Facility-Administered Medications  Medication Dose Route Frequency Provider Last Rate Last Admin   0.9 %  sodium chloride infusion  250 mL Intravenous PRN Serafina Mitchell, MD       0.9 %  sodium chloride infusion  100 mL Intravenous PRN Donato Heinz, MD       0.9 %  sodium chloride infusion  100 mL Intravenous PRN Donato Heinz, MD       albuterol (PROVENTIL) (2.5 MG/3ML) 0.083% nebulizer solution 10 mg  10 mg Nebulization Once Lucrezia Starch, MD       alteplase (CATHFLO ACTIVASE) injection 2 mg  2 mg Intracatheter Once PRN Donato Heinz, MD       Derrill Memo ON 12/16/2021] Chlorhexidine Gluconate Cloth 2 % PADS 6 each  6 each Topical Q0600 Donato Heinz, MD  heparin injection 1,000 Units  1,000 Units Dialysis PRN Donato Heinz, MD       heparin injection 4,000 Units  4,000 Units Dialysis Once in dialysis Donato Heinz, MD       lidocaine (PF) (XYLOCAINE) 1 % injection 5 mL  5 mL Intradermal PRN Donato Heinz, MD       lidocaine-prilocaine (EMLA) cream 1 application  1 application Topical PRN Donato Heinz, MD       pentafluoroprop-tetrafluoroeth (GEBAUERS) aerosol 1 application  1 application Topical PRN Donato Heinz, MD       sodium chloride flush (NS) 0.9 % injection 3 mL  3 mL Intravenous Q12H Serafina Mitchell, MD       sodium chloride flush (NS) 0.9 % injection 3 mL  3 mL Intravenous PRN Serafina Mitchell, MD       Current Outpatient Medications  Medication Sig Dispense Refill   acetaminophen (TYLENOL) 650 MG CR tablet Take 1,300 mg by mouth every 8 (eight) hours as needed for pain.     calcium acetate  (PHOSLO) 667 MG capsule Take 1,334-2,668 mg by mouth See admin instructions. Take 4 capsule (2668 mg) by mouth three times a day with each meal and 2 capsules (1334 mg) with each snack     carvedilol (COREG) 12.5 MG tablet Take 12.5 mg by mouth 2 (two) times daily with a meal.      diphenhydrAMINE (BENADRYL) 25 MG tablet Take 1 tablet (25 mg total) by mouth every 4 (four) hours as needed for itching or allergies. 30 tablet 0   doxercalciferol (HECTOROL) 4 MCG/2ML injection Use as recommended by nephrology 2 mL    loperamide (IMODIUM A-D) 2 MG tablet Take 4 mg by mouth daily as needed for diarrhea or loose stools.      multivitamin (RENA-VIT) TABS tablet Take 1 tablet by mouth daily.     Nutritional Supplements (NOVASOURCE RENAL) LIQD Take 237 mLs by mouth See admin instructions. Take 1 container (237 mls) by at bedtime on dialysis days (Tuesday, Thursday, Saturday)     RENVELA 800 MG tablet Take 800 mg by mouth 3 (three) times daily with meals.     calcitRIOL (ROCALTROL) 0.5 MCG capsule Take 4 capsules (2 mcg total) by mouth Every Tuesday,Thursday,and Saturday with dialysis. (Patient not taking: Reported on 12/15/2021)     hydrOXYzine (ATARAX/VISTARIL) 10 MG tablet Take 1 tablet (10 mg total) by mouth 3 (three) times daily as needed for anxiety. (Patient not taking: Reported on 12/15/2021) 5 tablet 0   Labs: Basic Metabolic Panel: Recent Labs  Lab 12/15/21 1118  NA 135  K >7.5*  CL 94*  CO2 14*  GLUCOSE 92  BUN 117*  CREATININE 13.60*  CALCIUM 8.4*   Liver Function Tests: Recent Labs  Lab 12/15/21 1118  AST 25  ALT 18  ALKPHOS 110  BILITOT 0.8  PROT 9.2*  ALBUMIN 2.6*   No results for input(s): LIPASE, AMYLASE in the last 168 hours. No results for input(s): AMMONIA in the last 168 hours. CBC: Recent Labs  Lab 12/15/21 1118  WBC 5.1  NEUTROABS 3.4  HGB 12.5  HCT 40.8  MCV 87.4  PLT 363   Cardiac Enzymes: No results for input(s): CKTOTAL, CKMB, CKMBINDEX, TROPONINI in  the last 168 hours. CBG: No results for input(s): GLUCAP in the last 168 hours. Iron Studies: No results for input(s): IRON, TIBC, TRANSFERRIN, FERRITIN in the last 72 hours. Studies/Results: No results found.  Physical Exam: Vitals:   12/15/21 1200  12/15/21 1215 12/15/21 1230 12/15/21 1300  BP: (!) 155/81  (!) 156/74 (!) 153/77  Pulse: (!) 32  92 (!) 128  Resp: 20 20 (!) 30 (!) 22  Temp:      TempSrc:      SpO2: (!) 80%  100% 91%  Weight:      Height:         General: Weak-appearing; Alert; NAD Head: NCAT sclera not icteric Lungs: Diminished in bases; Clear in uppers; No wheeze, rales or rhonchi. Breathing is unlabored. Heart: RRR. No murmur, rubs or gallops.  Abdomen: soft, nontender, +BS, no guarding, no rebound tenderness Lower extremities:no edema, ischemic changes, or open wounds  Neuro: AAOx3. Moves all extremities spontaneously. Dialysis Access: R AVF (+) Bruit/Thrill  Dialysis Orders:  TTS - [facility]  3hrs84min, BFR 400, DFR Auto 1.5,  BTD17OH, 2K/ 2Ca Heparin 4000 unit bolus Mircera 100 mcg q2wks - last 12/11/21 Home meds: Sensipar 30mg  QHS; Sevelamer 800mg  3 tabs TID with meals  Assessment/Plan: Nausea/Vomiting/Diarrhea-Ongoing over weekend. GI now following. Plan for NPO after midnight for Colonoscopy in AM and EGD on 12/21. ESRD - Missed HD 12/18. Currently hyperkalemic, tachycardiac, and elevated Bps. Plan for emergent HD today. Hypertension/volume  - Bps elevated, HD today, continue Coreg.  Anemia of CKD - Hgb 12.5; No ESA/Fe indicative at this time. Follow trends Secondary Hyperparathyroidism -  Corr Ca ok; will check PO4 in AM. Nutrition - CLD for now-NPO after midnight; can advance to renal diet once clinically stable  Tobie Poet, NP [employer] 12/15/2021, 2:28 PM    I have seen and examined this patient and agree with plan and assessment in the above note with renal recommendations/intervention highlighted.   Will plan for urgent HD due to markedly elevated potassium level.  Saw blood in vomitus, however Hgb stable, will continue to follow H/H.  Possible mallory-weiss tear.  Protracted nausea and dry heaves - unclear etiology.  GI following.  Broadus John A Mataeo Ingwersen,MD 12/15/2021 3:08 PM

## 2021-12-15 NOTE — Consult Note (Signed)
Consultation  Referring Provider:     ER Primary Care Physician:  [name] Herter, NP Primary Gastroenterologist:  Dr. Ardis [name]       Reason for Consultation:   Melena/hematemasis         HPI:   [name] is a 40 y.o. female with a past medical history as listed below including ESRD, seizures, polysubstance abuse, chronic systolic congestive heart failure (11/03/2020 echo with an LVEF 30-35%), SLE, presents to the ER with nausea, vomiting and diarrhea.  Patient was unable to complete Saturday dialysis due to infiltrated right arm, began to have dry heaving with diarrhea Saturday evening. States emesis was clear liquid to green to small volume red blood with possible black specks. Not been able to hold down any food or liquids since Saturday. States been having diarrhea for 45 minutes at least 40+ time.  Patient is having lower abdominal discomfort with bowel movements.  Denies melena or hematochezia. Patient was having chills, denies subjective fever.  Denies sick contacts. Patient's been having shortness of breath thought to be due to dry heaving, is on oxygen when she gets dialysis. So notes that when she wakes up in the morning will have small red blood clots in her mouth that she will to cough up about size of a nickel. Patient denies reflux, dysphagia, chest pain, leg swelling. Patient is not on any blood thinners, had 1 drink on her birthday [birth year], otherwise does not drink alcohol.  Denies any illicit drug use or marijuana use.  Patient does smoke cigarettes still last 1 was Friday.  In the ER patient found to have electrolyte abnormalities related to t missed dialysis, potassium greater than 7.5, BUN 117, creatinine 13.6, calcium 8.4.  CO2 14, INR 1.4, magnesium 2.9.  Patient had peaked T waves and narrow QRS in ER.  Nephrology arranged for urgent dialysis, which is yet to be done.  Patient has tachycardia and tachypnea. Patient started on Protonix and  fluids. Hemoglobin in the office August 10 of this year 11.8, 2 weeks ago 11.9, on this admission 12.5.   Previous GI work up: Last office visit 08/06/2021 [name] was seen for consultation of Hemoccult positive stools, constipation and diarrhea.  At that visit patient was set up for diagnostic endoscopy and colonoscopy with Hemoccult positive stools however cardiac clearance was unable to be obtained so the procedure was canceled.  06/06/2021 hemoglobin 8.0.  07/04/2021 hemoglobin 9.  07/01/2021 Hemoccult positive stool.   Past Medical History:  Diagnosis Date   Anemia    low iron - receives iron at dialysis   Anxiety    Arthritis    RA   Chronic systolic congestive heart failure (HCC) 03/16/2016   Dyspnea    ESRD (end stage renal disease) ([place])    [facility] _ [place]   H/O pericarditis 01/17/2013   H/O pleural effusion 01/17/2013   Heart murmur    Lupus (systemic lupus erythematosus) (HCC)    Previously followed with Dr. 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 pneumonae bacteremia on 01/21/2013. Sensitive to Peniccilin     Past Surgical History:  Procedure Laterality Date   AV FISTULA PLACEMENT     AV FISTULA PLACEMENT Right 08/01/2020   Procedure: RIGHT ARM BRACHIOCEPHALIC  ARTERIOVENOUS (AV) FISTULA CREATION;  Surgeon: [name] Posner, MD;  Location: [place];  Service: Vascular;  Laterality: Right;   [place] Left 02/05/2014   Procedure: [place];  Surgeon: [name] Posner, MD;  Location: [place];   Service: Vascular;  Laterality: Left;   [place] Right 01/08/2021   Procedure: RIGHT ARM SECOND STAGE [place];  Surgeon: [name] Posner, MD;  Location: [place];  Service: Vascular;  Laterality: Right;   FISTULA SUPERFICIALIZATION Left 05/30/2018   Procedure: FISTULA PLICATION BASILIC VEIN TRANSPOSITION;  Surgeon: [name] Mould, MD;  Location: [place];  Service: Vascular;  Laterality: Left;   FISTULA SUPERFICIALIZATION Left 16/09/9603   Procedure: PLICATION OF LEFT ARTERIOVENOUS FISTULA ULCER;  Surgeon: [name] Posner, MD;  Location: [place];  Service: Vascular;  Laterality: Left;   I & D EXTREMITY Right 02/18/2021   Procedure: IRRIGATION AND DEBRIDEMENT OF ARM;  Surgeon: [name] Mould, MD;  Location: [place];  Service: Vascular;  Laterality: Right;   INSERTION OF DIALYSIS CATHETER N/A 05/19/2020   Procedure: TUNNELED INSERTION  OF DIALYSIS CATHETER;  Surgeon: [name] Sandy, MD;  Location: [place];  Service: Vascular;  Laterality: N/A;   THROMBECTOMY AND REVISION OF ARTERIOVENTOUS (AV) GORETEX  GRAFT Left 07/28/2020   Procedure: Oversewing of left arm Brachial cephalic fistula for bleeding.;  Surgeon: [name] Mould, MD;  Location: [place];  Service: Vascular;  Laterality: Left;   VENOGRAM Right 01/31/2014   Procedure: DIALYSIS CATHETER;  Surgeon: [name] Mitchell, MD;  Location: [facility] CATH LAB;  Service: Cardiovascular;  Laterality: Right;    No family history on file.   Social History   Tobacco Use   Smoking status: Every Day    Packs/day: 0.12    Years: 15.00    Pack years: 1.80    Types: Cigarettes   Smokeless tobacco: Never   Tobacco comments:    4 cigarettes per day  Vaping Use   Vaping Use: Never used  Substance Use Topics   Alcohol use: Yes    Alcohol/week: 0.0 standard drinks    Comment: Special Occasional takes Vicar   Drug use: Yes    Types: Marijuana    Comment: ocassional, last time- 12/21/20    Prior to Admission  medications   Medication Sig Start Date End Date Taking? Authorizing Provider  acetaminophen (TYLENOL) 650 MG CR tablet Take 1,300 mg by mouth every 8 (eight) hours as needed for pain.   Yes [provider]  calcium acetate (PHOSLO) 667 MG capsule Take 1,334-2,668 mg by mouth See admin instructions. Take 4 capsule (2668 mg) by mouth three times a day with each meal and 2 capsules (1334 mg) with each snack   Yes [provider]  carvedilol (COREG) 12.5 MG tablet Take 12.5 mg by mouth 2 (two) times daily with a meal.  11/07/19  Yes [provider]  diphenhydrAMINE (BENADRYL) 25 MG tablet Take 1 tablet (25 mg total) by mouth every 4 (four) hours as needed for itching or allergies. 01/23/21  Yes Lavina Hamman, MD  doxercalciferol (HECTOROL) 4 MCG/2ML injection Use as recommended by nephrology 11/05/20  Yes Hosie Poisson, MD  loperamide (IMODIUM A-D) 2  MG tablet Take 4 mg by mouth daily as needed for diarrhea or loose stools.    Yes [provider]  multivitamin (RENA-VIT) TABS tablet Take 1 tablet by mouth daily.   Yes [provider]  Nutritional Supplements (NOVASOURCE RENAL) LIQD Take 237 mLs by mouth See admin instructions. Take 1 container (237 mls) by at bedtime on dialysis days (Tuesday, Thursday, Saturday)   Yes [provider]  RENVELA 800 MG tablet Take 800 mg by mouth 3 (three) times daily with meals. 12/02/21  Yes [provider]  calcitRIOL (ROCALTROL) 0.5 MCG capsule Take 4 capsules (2 mcg total) by mouth Every Tuesday,Thursday,and Saturday with dialysis. Patient not taking: Reported on 12/15/2021 11/07/20   Hosie Poisson, MD  hydrOXYzine (ATARAX/VISTARIL) 10 MG tablet Take 1 tablet (10 mg total) by mouth 3 (three) times daily as needed for anxiety. Patient not taking: Reported on 12/15/2021 07/29/19   Caccavale, Sophia, PA-C    Current Facility-Administered Medications  Medication Dose Route Frequency Provider Last Rate Last  Admin   0.9 %  sodium chloride infusion  250 mL Intravenous PRN [name] Mitchell, MD       0.9 %  sodium chloride infusion  100 mL Intravenous PRN Donato Heinz, MD       0.9 %  sodium chloride infusion  100 mL Intravenous PRN Donato Heinz, MD       albuterol (PROVENTIL) (2.5 MG/3ML) 0.083% nebulizer solution 10 mg  10 mg Nebulization Once Lucrezia Starch, MD       alteplase (CATHFLO ACTIVASE) injection 2 mg  2 mg Intracatheter Once PRN Donato Heinz, MD       Derrill Memo ON 12/16/2021] Chlorhexidine Gluconate Cloth 2 % PADS 6 each  6 each Topical Q0600 Donato Heinz, MD       heparin injection 1,000 Units  1,000 Units Dialysis PRN Donato Heinz, MD       heparin injection 4,000 Units  4,000 Units Dialysis Once in dialysis Donato Heinz, MD       lidocaine (PF) (XYLOCAINE) 1 % injection 5 mL  5 mL Intradermal PRN Donato Heinz, MD       lidocaine-prilocaine (EMLA) cream 1 application  1 application Topical PRN Donato Heinz, MD       pentafluoroprop-tetrafluoroeth (GEBAUERS) aerosol 1 application  1 application Topical PRN Donato Heinz, MD       sodium chloride flush (NS) 0.9 % injection 3 mL  3 mL Intravenous Q12H [name] Mitchell, MD       sodium chloride flush (NS) 0.9 % injection 3 mL  3 mL Intravenous PRN [name] Mitchell, MD       Current Outpatient Medications  Medication Sig Dispense Refill   acetaminophen (TYLENOL) 650 MG CR tablet Take 1,300 mg by mouth every 8 (eight) hours as needed for pain.     calcium acetate (PHOSLO) 667 MG capsule Take 1,334-2,668 mg by mouth See admin instructions. Take 4 capsule (2668 mg) by mouth three times a day with each meal and 2 capsules (1334 mg) with each snack     carvedilol (COREG) 12.5 MG tablet Take 12.5 mg by mouth 2 (two) times daily with a meal.      diphenhydrAMINE (BENADRYL) 25 MG tablet Take 1 tablet (25 mg total) by mouth every 4 (four) hours as needed for itching or allergies. 30 tablet 0    doxercalciferol (HECTOROL) 4 MCG/2ML injection Use as recommended by nephrology 2 mL    loperamide (IMODIUM A-D) 2 MG  tablet Take 4 mg by mouth daily as needed for diarrhea or loose stools.      multivitamin (RENA-VIT) TABS tablet Take 1 tablet by mouth daily.     Nutritional Supplements (NOVASOURCE RENAL) LIQD Take 237 mLs by mouth See admin instructions. Take 1 container (237 mls) by at bedtime on dialysis days (Tuesday, Thursday, Saturday)     RENVELA 800 MG tablet Take 800 mg by mouth 3 (three) times daily with meals.     calcitRIOL (ROCALTROL) 0.5 MCG capsule Take 4 capsules (2 mcg total) by mouth Every Tuesday,Thursday,and Saturday with dialysis. (Patient not taking: Reported on 12/15/2021)     hydrOXYzine (ATARAX/VISTARIL) 10 MG tablet Take 1 tablet (10 mg total) by mouth 3 (three) times daily as needed for anxiety. (Patient not taking: Reported on 12/15/2021) 5 tablet 0    Allergies as of 12/15/2021 - Review Complete 11/28/2021  Allergen Reaction Noted   Cephalosporins Rash 01/23/2021   Tobramycin sulfate Swelling 07/05/2012    Review of Systems:    Constitutional: No weight loss, fever, chills, weakness or fatigue Skin: No rash or itching Cardiovascular: No chest pain, chest pressure or palpitations   Respiratory: + Shortness of breath, no cough Gastrointestinal: See HPI and otherwise negative Genitourinary: Does not produce urine Neurological: No headache, dizziness or syncope Musculoskeletal: No new muscle or joint pain Hematologic: No bleeding or bruising    Physical Exam:  Vital signs in last 24 hours: Temp:  [97.4 F (36.3 C)] 97.4 F (36.3 C) (12/19 0923) Pulse Rate:  [32-128] 128 (12/19 1300) Resp:  [18-30] 22 (12/19 1300) BP: (149-156)/(74-81) 153/77 (12/19 1300) SpO2:  [80 %-100 %] 91 % (12/19 1300) Weight:  [47 kg] 47 kg (12/19 0924)    General:   Pleasant, thin appearing female , tachypneic, in no acute distress Head:  Normocephalic and atraumatic. Eyes:  sclerae anicteric,conjunctive pink  Heart: Tachycardia ,regular JSHFWY,6+ systolic murmur, JVP elevated and present. Pulm: Clear anteriorly; no wheezing Abdomen:  Soft,  tube in  AB, Normal bowel sounds. mild tenderness in the lower abdomen. Without guarding and Without rebound, without hepatomegaly. Extremities:  Without edema. Msk:  Symmetrical without gross deformities. Peripheral pulses intact.  AV fistula right arm Neurologic:  Alert and  oriented x4;  grossly normal neurologically. Skin:   Dry and intact without significant lesions or rashes. Psychiatric: Demonstrates good judgement and reason without abnormal affect or behaviors.  LAB RESULTS: Recent Labs    12/15/21 1118  WBC 5.1  HGB 12.5  HCT 40.8  PLT 363   BMET Recent Labs    12/15/21 1118  NA 135  K >7.5*  CL 94*  CO2 14*  GLUCOSE 92  BUN 117*  CREATININE 13.60*  CALCIUM 8.4*   LFT Recent Labs    12/15/21 1118  PROT 9.2*  ALBUMIN 2.6*  AST 25  ALT 18  ALKPHOS 110  BILITOT 0.8   PT/INR Recent Labs    12/15/21 1118  LABPROT 16.7*  INR 1.4*    STUDIES: No results found.    Impression    Hematemesis, low-volume, normal hemoglobin of 12.5 in setting of vomiting and diarrhea, after missed dialysis. Possible viral component, possible uremia. Patient is not on blood thinners HGB 12.5 MCV 87.4 Platelets 363 BUN 117 Cr 13.60 History of Hemoccult positive stool, supposed to have outpatient colonoscopy and endoscopy which was canceled in September due to lack of cardiac clearance. At the same patient stable from a GI perspective, appears to be more low-volume bright  red blood, no anemia.  More pressing matter appears to be her hypokalemia, tachycardia.  ESRD secondary to lupus Missed dialysis Saturday Plan for emergent dialysis Nephrology consulted  Hyperkalemia With EKG changes, tachycardia Plan for emergent dialysis  Systolic CHF 24/03/6285 echo with an LVEF 30-35% Appears to be  hypovolemic    Plan    Protonix 40 mg IV BID. -Clear liquid diet now -NPO at midnight as we will reevaluate patient for possible EGD tomorrow versus prepping for colonoscopy tomorrow and planning for EGD/colon on Wednesday. We will decide in the AM pending the patient's stability.  -I thoroughly discussed the procedure to include nature, alternatives, benefits, and risks including but not limited to bleeding, perforation, infection, anesthesia/cardiac and pulmonary complications. Patient provides understanding and gave verbal consent to proceed. -Continue daily CBC with transfusion as needed to maintain Hgb >7. -Please obtain early AM CBC, CMET, INR -If significant bleeding and hypotension or hemodynamic compromise occurs, please alert the GI Team to see if a more emergent scope has to be completed   Thank you for your kind consultation, we will continue to follow.  Vladimir Crofts  12/15/2021, 1:33 PM

## 2021-12-15 NOTE — Hospital Course (Addendum)
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 She was found to have peaked T waves on EKG, anion gap 27, K>7.5, Mg 2.9.  She received calcium gluconate, insulin, D5, albuterol nebulization for hyperkalemia in the ED.  She was taken for emergent HD. Her electrolytes were normalized as able and she was restarted on her ESRD medications.  Peptic Ulcer Disease Hematemesis was noted in patient's vomiting prior to ED presentation.  She also had positive Hemoccult study on 08/06/2021.  GI consulted.  Patient received EGD which showed potential Candida esophagitis, no evidence of Mallory-Weiss tear.  Patient had significant peptic ulcer disease in the setting of gastritis and gastropathy as well as 2 linear ulcers.  These were biopsied and will be followed up by GI.  Patient did not receive colonoscopy as this is opted for outpatient.  Patient was recommended to follow-up with GI in the outpatient setting.  Other chronic conditions were medically managed with home medications and formulary alternatives as necessary (CHF, HTN)  Follow-up Recommendations: Colonoscopy with GI in the outpatient setting Repeat EGD for ulcer healing surveillance in 2 to 3 months with GI

## 2021-12-15 NOTE — ED Provider Notes (Signed)
Pt is finally ready to go to dialysis.  Nurse noted her HR in the 190s.  BP ok.  Pt given another dose of calcium.  She was d/w Dr. Marval Regal who recommended getting her up to dialysis asap as we need to get her K down.  Admitting team notified.   Isla Pence, MD 12/15/21 [phone]

## 2021-12-15 NOTE — ED Notes (Signed)
Called dialysis and they states they will call when they are really

## 2021-12-15 NOTE — ED Triage Notes (Signed)
Pt BIB [facility] from home d/t n/v/d that started today. Also, While at dialysis on Saturday pt's port was infiltrated in her Rt arm. When she called her dialysis center & reported her n/v/d symptoms & they told her to come to ED. Pt reports all s/s started today & her emesis is now bile & tarry in appearence. Upon arrival to ED vomiting continues, no c/o pain. A/Ox4.  136/76, 90 bpm, 16 Resp, 98% on RA.

## 2021-12-15 NOTE — H&P (Addendum)
[facility] Admission History and Physical Service Pager: [phone]  Patient name: [name] Medical record number: [id number] Date of birth: 15-Jul-1981 Age: 40 y.o. Gender: female  Primary Care Provider: Camillia Herter, NP Consultants: Nephrology, GI Code Status: Full Preferred Emergency Contact:  Contact Information     Name Relation Home Work Mobile   R[place],Brenda Mother [phone]  [phone]   [place] Daughter [phone]        Chief Complaint: Dry heaving and diarrhea  Assessment and Plan: [name] is a 40 y.o. female presenting with dry heaving, vomiting and diarrhea. 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  She also reports that she missed her hemodialysis session on Saturday and did not sit for very long at her dialysis session on Thursday.  Upon presentation, patient had peaked T waves on EKG, and anion gap 27, K>7.5, K 2.9.  Nephrology was consulted for emergent dialysis.  Patient received calcium gluconate 1 g, 5 units NovoLog, D5, albuterol nebulization for hyperkalemia.  Patient COVID tested prior to emergent HD. Patient has been hospitalized multiple times in the past for hyperkalemia secondary to missed dialysis sessions.  - Admitted to [facility], attending Dr. Owens Shark, progressive care unit - Vital signs every hour then per unit with continuous pulse ox, continuous cardiac monitoring x24 hours - Nephrology consulted, appreciate recommendations - Emergent HD - Stat RFP and EKG - BMP repeat tonight as well - CBG check in 1 hour  Tachycardia Patient with significant tachycardia with ranges from <100 to almost 200 BPM. Patient expresses no palpitations; does note that she has shortness of breath, but  relates it to her vomiting/heaving. Likely worsened in the setting of abnormal volume status and hyperkalemia.  - Continuous cardiac monitoring - Restart home Coreg  Nausea/vomiting   diarrhea Patient had positive Hemoccult positive stools and was seen on 08/06/2021.  Patient was set up for diagnostic endoscopy and colonoscopy which were canceled in September due to lack of cardiac clearance.  Patient not anemic at this time, but has had hematemesis.  GI was consulted by ED provider.  Recommendations below per GI.  They are planning for EGD and colonoscopy tomorrow or Wednesday. - Clear liquid diet - NPO at midnight - Protonix 40 mg IV twice daily - Daily CBC, c-Met, INR - CBC transfusion threshold hemoglobin <7  CHF   HTN   HFmrEF Patient does not appear hypervolemic on exam, but has missed an HD session and may have some extra fluid. Patient also had hypertension in the 150s over 70s to 80s and tachycardia.  Patient has not received her medications in the last few days.  Tachycardia likely affected by not taking Coreg in addition to hyperkalemic status.  Echo on 11/03/2020 showed LVEF 30 to 35%, moderate LVH, RV mild enlargement, LA RA dilated, severe posterior mitral annular calcification. - Continue Coreg 12.5 mg twice daily   FEN/GI: Clear liquid diet, n.p.o. at midnight Prophylaxis: SCDs, will initiate heparin after procedures  Disposition: Progressive  History of Present Illness:  [name] is a 40 y.o. female presenting with dry heaving and diarrhea.  Patient is experiencing dry heaving and diarrhea since Saturday night, loss of appetite, and fatigue.  She is still having diarrhea.  She also  reports 20 emesis episodes prior to coming to the ED.  She reports that the ED physician saw black in her vomit but at home she only witnessed speckles of blood.  She has been intubated in the past for ESRD hospitalizations.  Patient reports that she missed dialysis on Tuesday, went  Wednesday, and went on Thursday but got off an hour early.  She missed dialysis on Saturday.  She does not make any urine.  Denies any pain at this time.  She does report that she has not eaten anything since Saturday and when she tries to drink the liquids come right back up.  She smoked cigarettes on Friday.  Denies alcohol and illicit drugs although does endorse that people around her smoke marijuana.  Review Of Systems: Per HPI with the following additions:  Review of Systems  Constitutional:  Positive for activity change, appetite change, chills, fatigue and fever (subjective).  HENT:  Negative for congestion, rhinorrhea and sore throat.   Respiratory:  Positive for shortness of breath.   Cardiovascular:  Negative for chest pain and palpitations.  Gastrointestinal:  Positive for abdominal pain (when having to stool), diarrhea, nausea and vomiting. Negative for constipation.  Genitourinary:        Does not make urine  Neurological:  Negative for headaches.    Patient Active Problem List   Diagnosis Date Noted   Trichomonas vaginitis 03/25/2021   Bacterial vaginitis 03/25/2021   Encounter for screening for COVID-19 01/24/2021   Hemodialysis access, fistula mature ([place]) 01/22/2021   Allergic reaction 01/22/2021   Acute respiratory failure with hypoxia (HCC) 11/02/2020   Acute hyperkalemia 07/28/2020   Nicotine dependence, cigarettes, uncomplicated 50/35/4656   Essential hypertension 07/28/2020   Polysubstance abuse ([place]) 07/28/2020   Hemodialysis catheter malfunction ([place]) 81/27/5170   Complication of AV dialysis fistula, sequela 07/28/2020   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[place]) 05/18/2020   Bacterial intestinal infection, unspecified 12/14/2019   Pain in arm, unspecified 08/15/2019   Cellulitis, unspecified 05/02/2019   Hyperkalemia 07/29/2018   SVT (supraventricular tachycardia) ([place]) 07/16/2018   Cardiomyopathy, unspecified ([place]) 12/03/2016   Diarrhea, unspecified 04/18/2016    Chronic combined systolic (congestive) and diastolic (congestive) heart failure ([place]) 03/16/2016   Other hypervolemia    S/P thoracentesis    Cough with hemoptysis    Myalgia    Pulmonary edema    Dependence on renal dialysis ([place]) 10/15/2015   Rash and nonspecific skin eruption 06/26/2015   Secondary Raynaud's phenomenon 06/24/2015   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   Fever, unspecified 08/28/2014   Pruritus, unspecified 08/28/2014   Headache, unspecified 04/30/2014   Pain, unspecified 04/30/2014   Cramping of hands 04/19/2014   Unspecified contraceptive management 04/19/2014   Mild protein-calorie malnutrition ([place]) 03/22/2014   Insomnia 03/15/2014   Benign hypertension with ESRD (end-stage renal disease) ([place]) 03/15/2014   Tobacco abuse 02/15/2014   Healthcare maintenance 02/15/2014   Iron deficiency anemia, unspecified 02/12/2014   Coagulation defect, unspecified ([place]) 02/10/2014   Anemia in chronic kidney disease 02/09/2014   Secondary hyperparathyroidism of renal origin ([place]) 02/09/2014   Hypoalbuminemia 02/01/2014   Nephrotic syndrome 02/01/2014   ESRD on dialysis ([place]) 01/31/2014   Pleural effusion, left 01/31/2014   Microcytic anemia 01/29/2014   Hypocalcemia 01/29/2014   Marijuana smoker ([place]) 01/18/2013   H/O pericarditis 01/17/2013   SLE (systemic lupus erythematosus) ([place]) 01/17/2013   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   S/P pericardiocentesis 01/17/2013   H/O pleural effusion 01/17/2013   Nephrosis 01/17/2013   Preseptal cellulitis 01/17/2013  Cardiac tamponade 12/29/2004   Pericardial effusion (noninflammatory) 12/29/2004    Past Medical History: Past Medical History:  Diagnosis Date   Anemia    low iron - receives iron at dialysis   Anxiety    Arthritis    RA   Chronic systolic congestive heart failure ([place]) 03/16/2016   Dyspnea    ESRD (end stage renal disease) ([place])    [facility] [facility] [place]    H/O pericarditis 01/17/2013   H/O pleural  effusion 01/17/2013   Heart murmur    Lupus (systemic lupus erythematosus) (HCC)    Previously followed with Dr. Charlestine Night, has not followed up recently   Lupus nephritis [facility]) 2006   Renal biopsy shows segmental endocapillary proliferation and cellular crescent formation (Class IIIA) and lupus membranous glomerulopathy (Class V, stage II)   Pneumonia    many times   Polysubstance abuse ([place])    cocaine, MJ, tobacco   S/P pericardiocentesis 01/17/2013   H/o pericardial effusion with tamponade 2006    Seizures ([place])    during pregnancy 1 time   Streptococcal bacteremia 01/23/2013   She had two S. pneumonae bacteremia on 01/21/2013. Sensitive to Peniccilin     Past Surgical History: Past Surgical History:  Procedure Laterality Date   AV FISTULA PLACEMENT     AV FISTULA PLACEMENT Right 08/01/2020   Procedure: RIGHT ARM BRACHIOCEPHALIC  ARTERIOVENOUS (AV) FISTULA CREATION;  Surgeon: [name], MD;  Location: [place];  Service: Vascular;  Laterality: Right;   [place] Left 02/05/2014   Procedure: [place];  Surgeon: [name], MD;  Location: [place];  Service: Vascular;  Laterality: Left;   [place] Right 01/08/2021   Procedure: RIGHT ARM SECOND STAGE [place];  Surgeon: [name], MD;  Location: [place];  Service: Vascular;  Laterality: Right;   FISTULA SUPERFICIALIZATION Left 05/30/2018   Procedure: FISTULA PLICATION BASILIC VEIN TRANSPOSITION;  Surgeon: Angelia Mould, MD;  Location: [place];  Service: Vascular;  Laterality: Left;   FISTULA SUPERFICIALIZATION Left 50/08/3817   Procedure: PLICATION OF LEFT ARTERIOVENOUS FISTULA ULCER;  Surgeon: [name], MD;  Location: [place];  Service: Vascular;  Laterality: Left;   I & D EXTREMITY Right 02/18/2021   Procedure: IRRIGATION AND DEBRIDEMENT OF ARM;  Surgeon: Angelia Mould, MD;  Location: [place];  Service: Vascular;  Laterality: Right;   INSERTION OF  DIALYSIS CATHETER N/A 05/19/2020   Procedure: TUNNELED INSERTION  OF DIALYSIS CATHETER;  Surgeon: Waynetta Sandy, MD;  Location: [facility];  Service: Vascular;  Laterality: N/A;   THROMBECTOMY AND REVISION OF ARTERIOVENTOUS (AV) GORETEX  GRAFT Left 07/28/2020   Procedure: Oversewing of left arm Brachial cephalic fistula for bleeding.;  Surgeon: Angelia Mould, MD;  Location: [place];  Service: Vascular;  Laterality: Left;   VENOGRAM Right 01/31/2014   Procedure: DIALYSIS CATHETER;  Surgeon: Serafina Mitchell, MD;  Location: [facility] CATH LAB;  Service: Cardiovascular;  Laterality: Right;    Social History: Social History   Tobacco Use   Smoking status: Every Day    Packs/day: 0.12    Years: 15.00    Pack years: 1.80    Types: Cigarettes   Smokeless tobacco: Never   Tobacco comments:    4 cigarettes per day  Vaping Use   Vaping Use: Never used  Substance Use Topics   Alcohol use: Yes    Alcohol/week: 0.0 standard drinks    Comment: 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  Allergies and Medications: Allergies  Allergen Reactions   Cephalosporins Rash    To both keflex and cefazolin   Tobramycin Sulfate Swelling    Eye swelling   Current Facility-Administered Medications on File Prior to Encounter  Medication Dose Route Frequency Provider Last Rate Last Admin   0.9 %  sodium chloride infusion  250 mL Intravenous PRN Serafina Mitchell, MD       sodium chloride flush (NS) 0.9 % injection 3 mL  3 mL Intravenous Q12H Serafina Mitchell, MD       sodium chloride flush (NS) 0.9 % injection 3 mL  3 mL Intravenous PRN Serafina Mitchell, MD       Current Outpatient Medications on File Prior to Encounter  Medication Sig Dispense Refill   acetaminophen (TYLENOL) 650 MG CR tablet Take 1,300 mg by mouth every 8 (eight) hours as needed for pain.     calcium acetate (PHOSLO)  667 MG capsule Take 1,334-2,668 mg by mouth See admin instructions. Take 4 capsule (2668 mg) by mouth three times a day with each meal and 2 capsules (1334 mg) with each snack     carvedilol (COREG) 12.5 MG tablet Take 12.5 mg by mouth 2 (two) times daily with a meal.      diphenhydrAMINE (BENADRYL) 25 MG tablet Take 1 tablet (25 mg total) by mouth every 4 (four) hours as needed for itching or allergies. 30 tablet 0   doxercalciferol (HECTOROL) 4 MCG/2ML injection Use as recommended by nephrology 2 mL    loperamide (IMODIUM A-D) 2 MG tablet Take 4 mg by mouth daily as needed for diarrhea or loose stools.      multivitamin (RENA-VIT) TABS tablet Take 1 tablet by mouth daily.     Nutritional Supplements (NOVASOURCE RENAL) LIQD Take 237 mLs by mouth See admin instructions. Take 1 container (237 mls) by at bedtime on dialysis days (Tuesday, Thursday, Saturday)     RENVELA 800 MG tablet Take 800 mg by mouth 3 (three) times daily with meals.     calcitRIOL (ROCALTROL) 0.5 MCG capsule Take 4 capsules (2 mcg total) by mouth Every Tuesday,Thursday,and Saturday with dialysis. (Patient not taking: Reported on 12/15/2021)     hydrOXYzine (ATARAX/VISTARIL) 10 MG tablet Take 1 tablet (10 mg total) by mouth 3 (three) times daily as needed for anxiety. (Patient not taking: Reported on 12/15/2021) 5 tablet 0    Objective: BP (!) 153/77    Pulse (!) 128    Temp (!) 97.4 F (36.3 C) (Oral)    Resp (!) 22    Ht _0  (1.626 m)    Wt 47 kg    SpO2 91%    BMI 17.79 kg/m  Exam: General: A&O x3 Eyes: EOMI, PERRL ENTM: Moist mucous membranes Neck: ROM normal Cardiovascular: Tachycardic, regular rhythm, grade 4 systolic murmur Respiratory: CTA B, no increased work of breathing Gastrointestinal: Soft, nontender, nondistended, normoactive bowel sounds MSK: Equal range of motion and extremities bilaterally Derm: No rashes or open lesions appreciated Neuro: No focal neurological deficits appreciated, appropriate in  conversation Psych: Normal mood and behavior  Labs and Imaging: CBC BMET  Recent Labs  Lab 12/15/21 1118  WBC 5.1  HGB 12.5  HCT 40.8  PLT 363   Recent Labs  Lab 12/15/21 1118  NA 135  K >7.5*  CL 94*  CO2 14*  BUN 117*  CREATININE 13.60*  GLUCOSE 92  CALCIUM 8.4*     EKG: Regular rate and rhythm,Increased amplitude of QRS of V3 to V6 likely relating to left ventricular hypertrophy, peaked T waves likely correlating to hyperkalemia given clinical picture, ST elevation in V3 to V4 similar to prior EKGs  No results found.   Heber [place]nard, DO 12/15/2021, 1:35 PM PGY-1, [facility] Intern pager: [phone], text pages welcome     FPTS Upper-Level Resident Addendum   I have independently interviewed and examined the patient. I have discussed the above with the original author and agree with their documentation. My edits for correction/addition/clarification are in within the document. Please see also any attending notes.   Rise Patience, DO  PGY-2, [facility] 12/15/2021 4:00 PM  [place] Service pager: [phone] (text pages welcome through [place])

## 2021-12-15 NOTE — Plan of Care (Signed)
  Problem: Clinical Measurements: Goal: Respiratory complications will improve Outcome: Progressing   Problem: Safety: Goal: Ability to remain free from injury will improve Outcome: Progressing   

## 2021-12-15 NOTE — ED Provider Notes (Addendum)
[facility] EMERGENCY DEPARTMENT Provider Note   CSN: [id number] Arrival date & time: 12/15/21  6578     History Chief Complaint  Patient presents with   n/v/d   Infiltrated Port    [name] is a 40 y.o. female.  Presents to ER with concern for nausea, vomiting, diarrhea.  Patient reports that symptoms started over the weekend, got worse today.  Thinks that she has vomited about a dozen times in the last 24 hours.  States initially vomit was clear but then started having some blood in it.  Has had loose stools, has not seen any blood in her stool.  No abdominal pain.  Still having nausea.  States that on dialysis day on Saturday they infiltrated her right arm and she was not able to complete the session.  HPI     Past Medical History:  Diagnosis Date   Anemia    low iron - receives iron at dialysis   Anxiety    Arthritis    RA   Chronic systolic congestive heart failure ([place]) 03/16/2016   Dyspnea    ESRD (end stage renal disease) ([place])    [place] TTHSAT _ [place]   H/O pericarditis 01/17/2013   H/O pleural effusion 01/17/2013   Heart murmur    Lupus (systemic lupus erythematosus) (HCC)    Previously followed with Dr. 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 pneumonae bacteremia on 01/21/2013. Sensitive to Peniccilin     Patient Active Problem List   Diagnosis Date Noted   Trichomonas vaginitis 03/25/2021   Bacterial vaginitis 03/25/2021   Encounter for screening for COVID-19 01/24/2021    Hemodialysis access, fistula mature ([place]) 01/22/2021   Allergic reaction 01/22/2021   Acute respiratory failure with hypoxia (HCC) 11/02/2020   Acute hyperkalemia 07/28/2020   Nicotine dependence, cigarettes, uncomplicated 46/96/2952   Essential hypertension 07/28/2020   Polysubstance abuse ([place]) 07/28/2020   Hemodialysis catheter malfunction ([place]) 84/13/2440   Complication of AV dialysis fistula, sequela 07/28/2020   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[place]) 05/18/2020   Bacterial intestinal infection, unspecified 12/14/2019   Pain in arm, unspecified 08/15/2019   Cellulitis, unspecified 05/02/2019   Hyperkalemia 07/29/2018   SVT (supraventricular tachycardia) ([place]) 07/16/2018   Cardiomyopathy, unspecified ([place]) 12/03/2016   Diarrhea, unspecified 04/18/2016   Chronic combined systolic (congestive) and diastolic (congestive) heart failure ([place]) 03/16/2016   Other hypervolemia    S/P thoracentesis    Cough with hemoptysis    Myalgia    Pulmonary edema    Dependence on renal dialysis ([place]) 10/15/2015   Rash and nonspecific skin eruption 06/26/2015   Secondary Raynaud's phenomenon 06/24/2015   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   Fever, unspecified 08/28/2014   Pruritus, unspecified 08/28/2014   Headache, unspecified 04/30/2014   Pain, unspecified 04/30/2014   Cramping of hands 04/19/2014   Unspecified contraceptive management 04/19/2014   Mild protein-calorie malnutrition ([place]) 03/22/2014   Insomnia 03/15/2014   Benign hypertension with ESRD (end-stage renal disease) ([place]) 03/15/2014   Tobacco abuse 02/15/2014  Healthcare maintenance 02/15/2014   Iron deficiency anemia, unspecified 02/12/2014   Coagulation defect, unspecified ([place]) 02/10/2014   Anemia in chronic kidney disease 02/09/2014   Secondary hyperparathyroidism of renal origin ([place]) 02/09/2014   Hypoalbuminemia 02/01/2014   Nephrotic syndrome 02/01/2014   ESRD on dialysis ([place]) 01/31/2014   Pleural effusion, left 01/31/2014    Microcytic anemia 01/29/2014   Hypocalcemia 01/29/2014   Marijuana smoker ([place]) 01/18/2013   H/O pericarditis 01/17/2013   SLE (systemic lupus erythematosus) ([place]) 01/17/2013   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   S/P pericardiocentesis 01/17/2013   H/O pleural effusion 01/17/2013   Nephrosis 01/17/2013   Preseptal cellulitis 01/17/2013   Cardiac tamponade 12/29/2004   Pericardial effusion (noninflammatory) 12/29/2004    Past Surgical History:  Procedure Laterality Date   AV FISTULA PLACEMENT     AV FISTULA PLACEMENT Right 08/01/2020   Procedure: RIGHT ARM BRACHIOCEPHALIC  ARTERIOVENOUS (AV) FISTULA CREATION;  Surgeon: [name], MD;  Location: [place];  Service: Vascular;  Laterality: Right;   [place] Left 02/05/2014   Procedure: [place];  Surgeon: [name], MD;  Location: [place];  Service: Vascular;  Laterality: Left;   [place] Right 01/08/2021   Procedure: RIGHT ARM SECOND STAGE [place];  Surgeon: [name], MD;  Location: [place];  Service: Vascular;  Laterality: Right;   FISTULA SUPERFICIALIZATION Left 05/30/2018   Procedure: FISTULA PLICATION BASILIC VEIN TRANSPOSITION;  Surgeon: Angelia Mould, MD;  Location: [place];  Service: Vascular;  Laterality: Left;   FISTULA SUPERFICIALIZATION Left 73/41/9379   Procedure: PLICATION OF LEFT ARTERIOVENOUS FISTULA ULCER;  Surgeon: [name], MD;  Location: [place];  Service: Vascular;  Laterality: Left;   I & D EXTREMITY Right 02/18/2021   Procedure: IRRIGATION AND DEBRIDEMENT OF ARM;  Surgeon: Angelia Mould, MD;  Location: [place];  Service: Vascular;  Laterality: Right;   INSERTION OF DIALYSIS CATHETER N/A 05/19/2020   Procedure: TUNNELED INSERTION  OF DIALYSIS CATHETER;  Surgeon: Waynetta Sandy, MD;  Location: [place];  Service: Vascular;  Laterality: N/A;   THROMBECTOMY AND REVISION OF ARTERIOVENTOUS (AV) GORETEX  GRAFT Left 07/28/2020    Procedure: Oversewing of left arm Brachial cephalic fistula for bleeding.;  Surgeon: Angelia Mould, MD;  Location: [place];  Service: Vascular;  Laterality: Left;   VENOGRAM Right 01/31/2014   Procedure: DIALYSIS CATHETER;  Surgeon: Serafina Mitchell, MD;  Location: [facility] CATH LAB;  Service: Cardiovascular;  Laterality: Right;     OB History   No obstetric history on file.     No family history on file.  Social History   Tobacco Use   Smoking status: Every Day    Packs/day: 0.12    Years: 15.00    Pack years: 1.80    Types: Cigarettes   Smokeless tobacco: Never   Tobacco comments:    4 cigarettes per day  Vaping Use   Vaping Use: Never used  Substance Use Topics   Alcohol use: Yes    Alcohol/week: 0.0 standard drinks    Comment: Special Occasional takes Vicar   Drug use: Yes    Types: Marijuana    Comment: ocassional, last time- 12/21/20    Home Medications Prior to Admission medications   Medication Sig Start Date End Date Taking? Authorizing Provider  acetaminophen (TYLENOL) 650 MG CR tablet Take 1,300 mg by mouth every 8 (eight) hours as needed for pain.   Yes [provider]  calcium acetate (PHOSLO) 667 MG capsule Take 1,334-2,668 mg by mouth See admin instructions. Take 4 capsule (2668 mg) by mouth three times a day with each meal and 2 capsules (1334 mg) with each snack   Yes [provider]  carvedilol (COREG) 12.5 MG tablet Take 12.5 mg by mouth 2 (two) times daily with a meal.  11/07/19  Yes [provider]  diphenhydrAMINE (BENADRYL) 25 MG tablet Take 1 tablet (25 mg total) by mouth every 4 (four) hours as needed for itching or allergies. 01/23/21  Yes Lavina Hamman, MD  doxercalciferol (HECTOROL) 4 MCG/2ML injection Use as recommended by nephrology 11/05/20  Yes Hosie Poisson, MD  loperamide (IMODIUM A-D) 2 MG tablet Take 4 mg by mouth daily as needed for diarrhea or loose stools.    Yes [provider]  multivitamin  (RENA-VIT) TABS tablet Take 1 tablet by mouth daily.   Yes [provider]  Nutritional Supplements (NOVASOURCE RENAL) LIQD Take 237 mLs by mouth See admin instructions. Take 1 container (237 mls) by at bedtime on dialysis days (Tuesday, Thursday, Saturday)   Yes [provider]  RENVELA 800 MG tablet Take 800 mg by mouth 3 (three) times daily with meals. 12/02/21  Yes [provider]  calcitRIOL (ROCALTROL) 0.5 MCG capsule Take 4 capsules (2 mcg total) by mouth Every Tuesday,Thursday,and Saturday with dialysis. Patient not taking: Reported on 12/15/2021 11/07/20   Hosie Poisson, MD  hydrOXYzine (ATARAX/VISTARIL) 10 MG tablet Take 1 tablet (10 mg total) by mouth 3 (three) times daily as needed for anxiety. Patient not taking: Reported on 12/15/2021 07/29/19   Caccavale, Sophia, PA-C    Allergies    Cephalosporins and Tobramycin sulfate  Review of Systems   Review of Systems  Constitutional:  Negative for chills and fever.  HENT:  Negative for ear pain and sore throat.   Eyes:  Negative for pain and visual disturbance.  Respiratory:  Negative for cough and shortness of breath.   Cardiovascular:  Negative for chest pain and palpitations.  Gastrointestinal:  Positive for diarrhea, nausea and vomiting. Negative for abdominal pain.  Genitourinary:  Negative for dysuria and hematuria.  Musculoskeletal:  Negative for arthralgias and back pain.  Skin:  Negative for color change and rash.  Neurological:  Negative for seizures and syncope.  All other systems reviewed and are negative.  Physical Exam Updated Vital Signs BP (!) 156/74    Pulse 92    Temp (!) 97.4 F (36.3 C) (Oral)    Resp 20    Ht 5\' 4"  (1.626 m)    Wt 47 kg    SpO2 100%    BMI 17.79 kg/m   Physical Exam Vitals and nursing note reviewed.  Constitutional:      General: She is not in acute distress.    Appearance: She is well-developed.  HENT:     Head: Normocephalic and atraumatic.  Eyes:      Conjunctiva/sclera: Conjunctivae normal.  Cardiovascular:     Rate and Rhythm: Regular rhythm. Tachycardia present.     Pulses: Normal pulses.     Heart sounds: No murmur heard. Pulmonary:     Effort: Pulmonary effort is normal. No respiratory distress.     Breath sounds: Normal breath sounds.  Abdominal:     Palpations: Abdomen is soft.     Tenderness: There is no abdominal tenderness.  Musculoskeletal:        General: No swelling.     Cervical back: Neck supple.  Skin:    General: Skin is warm and dry.     Capillary Refill: Capillary refill takes less than 2 seconds.  Neurological:     General: No focal deficit present.     Mental Status: She is alert.  Psychiatric:        Mood and Affect: Mood normal.    ED Results / Procedures / Treatments   Labs (all labs ordered are listed, but only abnormal results are displayed) Labs Reviewed  CBC WITH DIFFERENTIAL/PLATELET - Abnormal; Notable for the following components:      Result Value   RDW 20.0 (*)    nRBC 1.4 (*)    All other components within normal limits  COMPREHENSIVE METABOLIC PANEL - Abnormal; Notable for the following components:   Potassium >7.5 (*)    Chloride 94 (*)    CO2 14 (*)    BUN 117 (*)    Creatinine, Ser 13.60 (*)    Calcium 8.4 (*)    Total Protein 9.2 (*)    Albumin 2.6 (*)    GFR, Estimated 3 (*)    Anion gap 27 (*)    All other components within normal limits  PROTIME-INR - Abnormal; Notable for the following components:   Prothrombin Time 16.7 (*)    INR 1.4 (*)    All other components within normal limits  MAGNESIUM - Abnormal; Notable for the following components:   Magnesium 2.9 (*)    All other components within normal limits  RESP PANEL BY RT-PCR (FLU A&B, COVID) [id number]  TYPE AND SCREEN    EKG EKG Interpretation  Date/Time:  Monday December 15 2021 12:30:59 EST Ventricular Rate:  90 PR Interval:  174 QRS Duration: 131 QT Interval:  459 QTC Calculation: 562 R  Axis:   246 Text Interpretation: Ectopic atrial rhythm RBBB and LAFB Consider left ventricular hypertrophy Abnormal T, consider ischemia, lateral leads ST elevation, consider anterior injury st changes similar when compared to prior ecg on 11/28/2021, no acute STEMI Confirmed by Madalyn Rob [phone]) on 12/15/2021 12:34:54 PM  Radiology No results found.  Procedures .Critical Care Performed by: Lucrezia Starch, MD Authorized by: Lucrezia Starch, MD   Critical care provider statement:    Critical care time (minutes):  40   Critical care time was exclusive of:  Separately billable procedures and treating other patients   Critical care was necessary to treat or prevent imminent or life-threatening deterioration of the following conditions:  Metabolic crisis   Critical care was time spent personally by me on the following activities:  Development of treatment plan with patient or surrogate, discussions with consultants, evaluation of patient's response to treatment, examination of patient, ordering and review of laboratory studies, ordering and review of radiographic studies, ordering and performing treatments and interventions, pulse oximetry, re-evaluation of patient's condition and review of old charts   Care discussed with: admitting provider     Medications Ordered in ED Medications  pantoprazole (PROTONIX) injection 40 mg (has no administration in time range)  ondansetron (ZOFRAN) injection 4 mg (has no administration in time range)  calcium gluconate inj 10% (1 g) URGENT USE ONLY! (has no administration in time range)  albuterol (PROVENTIL) (2.5 MG/3ML) 0.083% nebulizer solution 10 mg (has no administration in time range)  insulin aspart (novoLOG) injection 5 Units (has no administration in time range)    And  dextrose 50 % solution 50 mL (has no administration in time range)  sodium bicarbonate injection 50 mEq (has no  administration in time range)  sodium chloride 0.9 % bolus  500 mL (1,000 mLs Intravenous New Bag/Given 12/15/21 1304)    ED Course  I have reviewed the triage vital signs and the nursing notes.  Pertinent labs & imaging results that were available during my care of the patient were reviewed by me and considered in my medical decision making (see chart for details).    MDM Rules/Calculators/A&P                         40 year old lady presented to ER due to concern for nausea, vomiting and missed dialysis.  On exam patient noted to be tachycardic but stable blood pressure.  EKG with concern for peaked T waves, qrs is narrow, still sinus rhythm.  Labs concerning for hyperkalemia.  Hemoglobin is stable.  For hyperkalemia, ordered bicarb, albuterol, insulin, calcium.  Consulted nephrology, discussed with Dr. Meredeth Ide who will arrange for dialysis ASAP.  From renal standpoint, ok to admit to medicine service.  For possible GI bleed, gave dose of Protonix, some fluids.  Consulted GI, discussed with [place] - they will see as consult.  Will consult medicine for admission.      Final Clinical Impression(s) / ED Diagnoses Final diagnoses:  Hyperkalemia  ESRD (end stage renal disease) ([place])  Upper GI bleed    Rx / DC Orders ED Discharge Orders     None        Lucrezia Starch, MD 12/15/21 1305    Lucrezia Starch, MD 12/15/21 1409

## 2021-12-15 NOTE — ED Notes (Signed)
Patient transported to dialysis

## 2021-12-16 DIAGNOSIS — Z992 Dependence on renal dialysis: Secondary | ICD-10-CM | POA: Diagnosis not present

## 2021-12-16 DIAGNOSIS — I132 Hypertensive heart and chronic kidney disease with heart failure and with stage 5 chronic kidney disease, or end stage renal disease: Secondary | ICD-10-CM | POA: Diagnosis present

## 2021-12-16 DIAGNOSIS — F419 Anxiety disorder, unspecified: Secondary | ICD-10-CM | POA: Diagnosis present

## 2021-12-16 DIAGNOSIS — N2581 Secondary hyperparathyroidism of renal origin: Secondary | ICD-10-CM | POA: Diagnosis present

## 2021-12-16 DIAGNOSIS — D649 Anemia, unspecified: Secondary | ICD-10-CM | POA: Diagnosis not present

## 2021-12-16 DIAGNOSIS — R195 Other fecal abnormalities: Secondary | ICD-10-CM | POA: Diagnosis not present

## 2021-12-16 DIAGNOSIS — K920 Hematemesis: Secondary | ICD-10-CM | POA: Diagnosis not present

## 2021-12-16 DIAGNOSIS — K2970 Gastritis, unspecified, without bleeding: Secondary | ICD-10-CM | POA: Diagnosis not present

## 2021-12-16 DIAGNOSIS — E8729 Other acidosis: Secondary | ICD-10-CM | POA: Diagnosis present

## 2021-12-16 DIAGNOSIS — D631 Anemia in chronic kidney disease: Secondary | ICD-10-CM | POA: Diagnosis present

## 2021-12-16 DIAGNOSIS — I3481 Nonrheumatic mitral (valve) annulus calcification: Secondary | ICD-10-CM | POA: Diagnosis present

## 2021-12-16 DIAGNOSIS — Z20822 Contact with and (suspected) exposure to covid-19: Secondary | ICD-10-CM | POA: Diagnosis present

## 2021-12-16 DIAGNOSIS — F17210 Nicotine dependence, cigarettes, uncomplicated: Secondary | ICD-10-CM | POA: Diagnosis present

## 2021-12-16 DIAGNOSIS — K922 Gastrointestinal hemorrhage, unspecified: Secondary | ICD-10-CM | POA: Diagnosis not present

## 2021-12-16 DIAGNOSIS — R739 Hyperglycemia, unspecified: Secondary | ICD-10-CM | POA: Diagnosis present

## 2021-12-16 DIAGNOSIS — R636 Underweight: Secondary | ICD-10-CM | POA: Diagnosis present

## 2021-12-16 DIAGNOSIS — R112 Nausea with vomiting, unspecified: Secondary | ICD-10-CM

## 2021-12-16 DIAGNOSIS — M3214 Glomerular disease in systemic lupus erythematosus: Secondary | ICD-10-CM | POA: Diagnosis present

## 2021-12-16 DIAGNOSIS — M069 Rheumatoid arthritis, unspecified: Secondary | ICD-10-CM | POA: Diagnosis present

## 2021-12-16 DIAGNOSIS — Z681 Body mass index (BMI) 19 or less, adult: Secondary | ICD-10-CM | POA: Diagnosis not present

## 2021-12-16 DIAGNOSIS — G4700 Insomnia, unspecified: Secondary | ICD-10-CM | POA: Diagnosis present

## 2021-12-16 DIAGNOSIS — E871 Hypo-osmolality and hyponatremia: Secondary | ICD-10-CM | POA: Diagnosis present

## 2021-12-16 DIAGNOSIS — R197 Diarrhea, unspecified: Secondary | ICD-10-CM | POA: Diagnosis present

## 2021-12-16 DIAGNOSIS — I5022 Chronic systolic (congestive) heart failure: Secondary | ICD-10-CM | POA: Diagnosis present

## 2021-12-16 DIAGNOSIS — D509 Iron deficiency anemia, unspecified: Secondary | ICD-10-CM

## 2021-12-16 DIAGNOSIS — E875 Hyperkalemia: Secondary | ICD-10-CM | POA: Diagnosis present

## 2021-12-16 DIAGNOSIS — N186 End stage renal disease: Secondary | ICD-10-CM | POA: Diagnosis present

## 2021-12-16 DIAGNOSIS — K259 Gastric ulcer, unspecified as acute or chronic, without hemorrhage or perforation: Secondary | ICD-10-CM | POA: Diagnosis present

## 2021-12-16 LAB — RENAL FUNCTION PANEL
Albumin: 2.4 g/dL — ABNORMAL LOW (ref 3.5–5.0)
Albumin: 2.4 g/dL — ABNORMAL LOW (ref 3.5–5.0)
Anion gap: 14 (ref 5–15)
Anion gap: 15 (ref 5–15)
BUN: 31 mg/dL — ABNORMAL HIGH (ref 6–20)
BUN: 36 mg/dL — ABNORMAL HIGH (ref 6–20)
CO2: 23 mmol/L (ref 22–32)
CO2: 24 mmol/L (ref 22–32)
Calcium: 8.8 mg/dL — ABNORMAL LOW (ref 8.9–10.3)
Calcium: 9 mg/dL (ref 8.9–10.3)
Chloride: 94 mmol/L — ABNORMAL LOW (ref 98–111)
Chloride: 94 mmol/L — ABNORMAL LOW (ref 98–111)
Creatinine, Ser: 5.89 mg/dL — ABNORMAL HIGH (ref 0.44–1.00)
Creatinine, Ser: 7.27 mg/dL — ABNORMAL HIGH (ref 0.44–1.00)
GFR, Estimated: 9 mL/min — ABNORMAL LOW (ref >60)
GFR, Estimated: 7 mL/min — ABNORMAL LOW (ref >60)
Glucose, Bld: 76 mg/dL (ref 70–99)
Glucose, Bld: 82 mg/dL (ref 70–99)
Phosphorus: 7 mg/dL — ABNORMAL HIGH (ref 2.5–4.6)
Phosphorus: 8.7 mg/dL — ABNORMAL HIGH (ref 2.5–4.6)
Potassium: 4.3 mmol/L (ref 3.5–5.1)
Potassium: 5 mmol/L (ref 3.5–5.1)
Sodium: 131 mmol/L — ABNORMAL LOW (ref 135–145)
Sodium: 133 mmol/L — ABNORMAL LOW (ref 135–145)

## 2021-12-16 LAB — CBC
HCT: 36.2 % (ref 36.0–46.0)
HCT: 37.2 % (ref 36.0–46.0)
Hemoglobin: 11.5 g/dL — ABNORMAL LOW (ref 12.0–15.0)
Hemoglobin: 11.4 g/dL — ABNORMAL LOW (ref 12.0–15.0)
MCH: 27.2 pg (ref 26.0–34.0)
MCH: 26.7 pg (ref 26.0–34.0)
MCHC: 31.8 g/dL (ref 30.0–36.0)
MCHC: 30.6 g/dL (ref 30.0–36.0)
MCV: 85.6 fL (ref 80.0–100.0)
MCV: 87.1 fL (ref 80.0–100.0)
Platelets: 268 10*3/uL (ref 150–400)
Platelets: 273 10*3/uL (ref 150–400)
RBC: 4.23 MIL/uL (ref 3.87–5.11)
RBC: 4.27 MIL/uL (ref 3.87–5.11)
RDW: 19.9 % — ABNORMAL HIGH (ref 11.5–15.5)
RDW: 20 % — ABNORMAL HIGH (ref 11.5–15.5)
WBC: 4.3 10*3/uL (ref 4.0–10.5)
WBC: 3.4 10*3/uL — ABNORMAL LOW (ref 4.0–10.5)
nRBC: 0.9 % — ABNORMAL HIGH (ref 0.0–0.2)
nRBC: 1.2 % — ABNORMAL HIGH (ref 0.0–0.2)

## 2021-12-16 LAB — HEPATITIS B SURFACE ANTIBODY, QUANTITATIVE: Hep B S AB Quant (Post): 148.9 m[IU]/mL (ref >9.9)

## 2021-12-16 LAB — HCG, QUANTITATIVE, PREGNANCY: hCG, Beta Chain, Quant, S: 2 m[IU]/mL (ref <5)

## 2021-12-16 LAB — HCG, SERUM, QUALITATIVE: Preg, Serum: POSITIVE — AB

## 2021-12-16 LAB — MAGNESIUM: Magnesium: 2.2 mg/dL (ref 1.7–2.4)

## 2021-12-16 MED ORDER — ONDANSETRON HCL 4 MG/2ML IJ SOLN
4.0000 mg | Freq: Once | INTRAMUSCULAR | Status: AC
  Administered 2021-12-16: 08:00:00 4 mg via INTRAVENOUS
  Filled 2021-12-16: qty 2

## 2021-12-16 MED ORDER — HYDROXYZINE HCL 10 MG PO TABS
10.0000 mg | ORAL_TABLET | Freq: Three times a day (TID) | ORAL | Status: DC | PRN
  Administered 2021-12-16 – 2021-12-18 (×4): 10 mg via ORAL
  Filled 2021-12-16 (×4): qty 1

## 2021-12-16 MED ORDER — PEG-KCL-NACL-NASULF-NA ASC-C 100 G PO SOLR
0.5000 | Freq: Once | ORAL | Status: AC
  Administered 2021-12-16: 18:00:00 100 g via ORAL
  Filled 2021-12-16: qty 1

## 2021-12-16 MED ORDER — PEG-KCL-NACL-NASULF-NA ASC-C 100 G PO SOLR
0.5000 | Freq: Once | ORAL | Status: DC
  Filled 2021-12-16: qty 1

## 2021-12-16 MED ORDER — CALCIUM ACETATE (PHOS BINDER) 667 MG PO CAPS: 1334.0000 mg | ORAL_CAPSULE | ORAL | Status: DC | PRN

## 2021-12-16 MED ORDER — PEG-KCL-NACL-NASULF-NA ASC-C 100 G PO SOLR: 1.0000 | Freq: Once | ORAL | Status: DC

## 2021-12-16 MED ORDER — HEPARIN SODIUM (PORCINE) 1000 UNIT/ML DIALYSIS: 20.0000 [IU]/kg | INTRAMUSCULAR | Status: DC | PRN

## 2021-12-16 MED ORDER — BISACODYL 5 MG PO TBEC
10.0000 mg | DELAYED_RELEASE_TABLET | Freq: Once | ORAL | Status: AC
  Administered 2021-12-16: 11:00:00 10 mg via ORAL
  Filled 2021-12-16: qty 2

## 2021-12-16 MED ORDER — ONDANSETRON HCL 4 MG/2ML IJ SOLN
4.0000 mg | Freq: Four times a day (QID) | INTRAMUSCULAR | Status: AC
  Administered 2021-12-16 (×2): 4 mg via INTRAVENOUS
  Filled 2021-12-16 (×2): qty 2

## 2021-12-16 MED ORDER — CALCIUM ACETATE (PHOS BINDER) 667 MG PO CAPS
2668.0000 mg | ORAL_CAPSULE | Freq: Three times a day (TID) | ORAL | Status: DC
  Administered 2021-12-16 – 2021-12-18 (×4): 2668 mg via ORAL
  Filled 2021-12-16 (×5): qty 4

## 2021-12-16 NOTE — Plan of Care (Signed)

## 2021-12-16 NOTE — H&P (View-Only) (Signed)
Daily Rounding Note  12/16/2021, 8:52 AM  LOS: 0 days   SUBJECTIVE:   Chief complaint:    Nausea, vomiting, hematemesis, diarrhea.  Still with dry heaves.  Last episode of watery brown stool was last night.  Not a lot of abdominal pain.  She has a sense of hunger and dry mouth being n.p.o.  OBJECTIVE:         Vital signs in last 24 hours:    Temp:  [97.4 F (36.3 C)-98.2 F (36.8 C)] 98.1 F (36.7 C) (12/20 0732) Pulse Rate:  [32-128] 98 (12/20 0732) Resp:  [16-30] 18 (12/20 0356) BP: (132-162)/(63-86) 149/83 (12/20 0732) SpO2:  [35 %-100 %] 99 % (12/20 0732) Weight:  [46.1 kg-47 kg] 46.1 kg (12/20 0027) Last BM Date: 12/15/21 Filed Weights   12/15/21 0924 12/16/21 0027  Weight: 47 kg 46.1 kg   General: Looks chronically ill.  Comfortable, easily arousable from her rest. Heart: Systolic murmur Chest: Clear bilaterally no labored breathing. Abdomen: Soft, not tender.  Not distended.  Bowel sounds present. Extremities: No CCE. Neuro/Psych: Alert.  Oriented x3.  Cooperative.  Calm.  Intake/Output from previous day: 12/19 0701 - 12/20 0700 In: 240 [P.O.:240] Out: 0   Intake/Output this shift: No intake/output data recorded.  Lab Results: Recent Labs    12/15/21 1118 12/15/21 1612 12/16/21 0126  WBC 5.1 5.5 4.3  HGB 12.5 11.2* 11.5*  HCT 40.8 36.2 36.2  PLT 363 345 268   BMET Recent Labs    12/15/21 1612 12/15/21 2247 12/16/21 0126  NA 132* 132* 131*  K 3.1* 3.8 4.3  CL 96* 95* 94*  CO2 24 24 23   GLUCOSE 83 75 76  BUN 23* 29* 31*  CREATININE 3.82* 5.59* 5.89*  CALCIUM 8.4* 8.6* 8.8*   LFT Recent Labs    12/15/21 1118 12/15/21 1408 12/15/21 1612 12/15/21 2247 12/16/21 0126  PROT 9.2*  --   --   --   --   ALBUMIN 2.6*   < > 2.5* 2.4* 2.4*  AST 25  --   --   --   --   ALT 18  --   --   --   --   ALKPHOS 110  --   --   --   --   BILITOT 0.8  --   --   --   --    < > = values in this  interval not displayed.   PT/INR Recent Labs    12/15/21 1118  LABPROT 16.7*  INR 1.4*   Hepatitis Panel Recent Labs    12/15/21 1612  HEPBSAG NON REACTIVE    Studies/Results: No results found.  ASSESMENT:   Nausea, vomiting, diarrhea.  Diarrhea is last few days, N/V for many  weeks but worse,  evolving to CGE.   Never had intended EGD, colonoscopy for symptoms and FOBT positive stool as cardiac clearance had not been obtained.  Positive urine pregnancy, beta hCG not elevated.  Per resident note, DO [place]: "No further active intervention required".    Hyperkalemia in setting of missed HD.  Currently normal at 4.3 following HD.  Marland Kitchen  ESRD    CHF.  EF 30 to 35%.  Lungs currently clear on auscultation and no symptoms of acute CHF flare.  Anemia.  Chronic.  Hgb actually improved compared with June, though it fluctuates quite a bit.  12.5 >> 11.5 since arrival.   Low iron, TIBC, iron sats,  transferrin.  Elevated ferritin.  Hyponatremia.  Isolated hyperglycemia.  Patient is not a diagnosed diabetic and since the elevated reading in January she has been at or below normal serum glucose.   PLAN   EGD, +/- colonoscopy tomorrow.  It is likely she will not be able to tolerate a full bowel prep but will give it a try and may need to just pursue flex sig depending on how well she can clear out her bowel.  Patient agreeable.  Orders for clear liquid diet, gentle bowel prep and procedures placed.   Time set for 1245 tomorrow afternoon.  Pt agreeable.  Continue IV Protonix twice daily   Azucena Freed  12/16/2021, 8:52 AM Phone [phone]

## 2021-12-16 NOTE — TOC Progression Note (Signed)
Transition of Care [facility]) - Progression Note    Patient Details  Name: [name] MRN: [id number] Date of Birth: 01-Nov-1981  Transition of Care [facility]) CM/SW Contact  Zenon Mayo, RN Phone Number: 12/16/2021, 4:22 PM  Clinical Narrative:    From home, GIB, missed Dialysis, for EGD tomorrow, HD patient TTHSAT, Hear rate tachy, having nausea, conts iv protonix and iv zofran.  TOC will continue to follow for dc needs.        Expected Discharge Plan and Services                                                 Social Determinants of Health (SDOH) Interventions    Readmission Risk Interventions No flowsheet data found.

## 2021-12-16 NOTE — Progress Notes (Signed)
Patient ID: [name], female   DOB: 15-May-1981, 40 y.o.   MRN: [id number] S: STill nauseated but no vomiting.  O:BP (!) 149/83 (BP Location: Left Arm)    Pulse 98    Temp 98.1 F (36.7 C) (Oral)    Resp 18    Ht 5\' 4"  (1.626 m)    Wt 46.1 kg    SpO2 99%    BMI 17.45 kg/m   Intake/Output Summary (Last 24 hours) at 12/16/2021 9678 Last data filed at 12/15/2021 2102 Gross per 24 hour  Intake 240 ml  Output 0 ml  Net 240 ml   Intake/Output: I/O last 3 completed shifts: In: 240 [P.O.:240] Out: 0   Intake/Output this shift:  No intake/output data recorded. Weight change:  Gen:NAD CVS: tachy at 100, no rub Resp: CTA Abd: +BS, soft, NT/Nd Ext: no edema, RUE AVG +T/B  Recent Labs  Lab 12/15/21 1118 12/15/21 1408 12/15/21 1612 12/15/21 2247 12/16/21 0126  NA 135 136 132* 132* 131*  K >7.5* 6.6* 3.1* 3.8 4.3  CL 94* 95* 96* 95* 94*  CO2 14* 18* 24 24 23   GLUCOSE 92 70 83 75 76  BUN 117* 121* 23* 29* 31*  CREATININE 13.60* 13.51* 3.82* 5.59* 5.89*  ALBUMIN 2.6* 2.4* 2.5* 2.4* 2.4*  CALCIUM 8.4* 8.3* 8.4* 8.6* 8.8*  PHOS  --  >30.0* 3.8 6.2* 7.0*  AST 25  --   --   --   --   ALT 18  --   --   --   --    Liver Function Tests: Recent Labs  Lab 12/15/21 1118 12/15/21 1408 12/15/21 1612 12/15/21 2247 12/16/21 0126  AST 25  --   --   --   --   ALT 18  --   --   --   --   ALKPHOS 110  --   --   --   --   BILITOT 0.8  --   --   --   --   PROT 9.2*  --   --   --   --   ALBUMIN 2.6*   < > 2.5* 2.4* 2.4*   < > = values in this interval not displayed.   No results for input(s): LIPASE, AMYLASE in the last 168 hours. No results for input(s): AMMONIA in the last 168 hours. CBC: Recent Labs  Lab 12/15/21 1118 12/15/21 1612 12/16/21 0126  WBC 5.1 5.5 4.3  NEUTROABS 3.4  --   --   HGB 12.5 11.2* 11.5*  HCT 40.8 36.2 36.2  MCV 87.4 85.8 85.6  PLT 363 345 268   Cardiac Enzymes: No results for input(s): CKTOTAL, CKMB, CKMBINDEX, TROPONINI in the last 168  hours. CBG: No results for input(s): GLUCAP in the last 168 hours.  Iron Studies: No results for input(s): IRON, TIBC, TRANSFERRIN, FERRITIN in the last 72 hours. Studies/Results: No results found.  albuterol  10 mg Nebulization Once   bisacodyl  10 mg Oral Once   carvedilol  12.5 mg Oral BID WC   Chlorhexidine Gluconate Cloth  6 each Topical Q0600   heparin  4,000 Units Dialysis Once in dialysis   pantoprazole (PROTONIX) IV  40 mg Intravenous BID    BMET    Component Value Date/Time   NA 131 (L) 12/16/2021 0126   K 4.3 12/16/2021 0126   CL 94 (L) 12/16/2021 0126   CO2 23 12/16/2021 0126   GLUCOSE 76 12/16/2021 0126   BUN 31 (  H) 12/16/2021 0126   CREATININE 5.89 (H) 12/16/2021 0126   CALCIUM 8.8 (L) 12/16/2021 0126   CALCIUM 6.7 (L) 01/31/2014 1309   GFRNONAA 9 (L) 12/16/2021 0126   GFRAA 9 (L) 08/02/2020 1049   CBC    Component Value Date/Time   WBC 4.3 12/16/2021 0126   RBC 4.23 12/16/2021 0126   HGB 11.5 (L) 12/16/2021 0126   HCT 36.2 12/16/2021 0126   PLT 268 12/16/2021 0126   MCV 85.6 12/16/2021 0126   MCH 27.2 12/16/2021 0126   MCHC 31.8 12/16/2021 0126   RDW 19.9 (H) 12/16/2021 0126   LYMPHSABS 1.3 12/15/2021 1118   MONOABS 0.4 12/15/2021 1118   EOSABS 0.0 12/15/2021 1118   BASOSABS 0.0 12/15/2021 1118    Dialysis Orders:  TTS - [facility]  3hrs66min, BFR 400, DFR Auto 1.5,  [id number], 2K/ 2Ca Heparin 4000 unit bolus Mircera 100 mcg q2wks - last 12/11/21 Home meds: Sensipar 30mg  QHS; Sevelamer 800mg  3 tabs TID with meals   Assessment/Plan: Nausea/Vomiting/Diarrhea-Ongoing over weekend. GI now following. Plan for NPO after midnight for Colonoscopy and EGD on 12/21. Hyperkalemia - resolved with urgent HD on 12/15/21 ESRD - Missed HD 12/18. Hyperkalemic, tachycardiac, and elevated Bps, s/p emergent HD 12/15/21.  Will continue with MWF schedule while an inpatient.  Hypertension/volume  - Bps elevated, HD today, continue Coreg.  Anemia of  CKD - Hgb 12.5; No ESA/Fe indicative at this time. Follow trends Secondary Hyperparathyroidism -  Corr Ca ok; will check PO4 in AM. Nutrition - npo for procedures per GI.   Donetta Potts, MD [employer] [phone]

## 2021-12-16 NOTE — Plan of Care (Signed)
  Problem: Education: Goal: Knowledge of General Education information will improve Description Including pain rating scale, medication(s)/side effects and non-pharmacologic comfort measures Outcome: Progressing   Problem: Clinical Measurements: Goal: Ability to maintain clinical measurements within normal limits will improve Outcome: Progressing   Problem: Activity: Goal: Risk for activity intolerance will decrease Outcome: Progressing   

## 2021-12-16 NOTE — Progress Notes (Signed)
FPTS Brief Progress Note  S: Sleeping. Recently returned from dialysis.    O: BP (!) 152/76 (BP Location: Left Arm)    Pulse (!) 103    Temp 98.2 F (36.8 C) (Oral)    Resp 18    Ht 5\' 4"  (1.626 m)    Wt 47 kg    SpO2 97%    BMI 17.79 kg/m   General: Sleeping, no acute distress. Age appropriate. Respiratory: normal effort  A/P: Hyperkalemia   hypermagnesemia   ESRD 2/2 Lupus on HD TTS -  K 3.8 - Orders reviewed. Labs for AM ordered, which was adjusted as needed.   Gerlene Fee, DO 12/16/2021, 12:15 AM PGY-3, [facility] Night Resident  Please page [phone] with questions.

## 2021-12-16 NOTE — Progress Notes (Signed)
Family Medicine Teaching Service Daily Progress Note Intern Pager: [phone]  Patient name: [name] Medical record number: [id number] Date of birth: [birth year] Age: 40 y.o. Gender: female  Primary Care Provider: Camillia Herter, NP Consultants: Nephrology, GI Code Status: Full  Pt Overview and Major Events to Date:  12/19: admitted to [place], emergent HD  Assessment and Plan: [name] is a 40 y.o. female presenting due to missed HD. 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   Nausea/vomiting   diarrhea GI following and patient is being planned for EGD and colonoscopy either today or tomorrow.  She has been n.p.o. since midnight. -GI following, appreciate recs -EGD and colonoscopy tomorrow -Continue n.p.o. -Protonix 40 mg IV twice daily -Daily CBC, RFP, INR -CBC transfusion threshold hemoglobin <7  ESRD 2/2 lupus on HD TTS   hyperkalemia, hypermagnesemia: Resolved Patient received emergent HD yesterday.  Potassium now 4.3 and magnesium 2.2. -Nephrology following, appreciate recommendations -HD tomorrow  -start PhosLo  HTN   tachycardia Heart rates have been in the 90s to 100s with high blood pressures in the 140s to 160s over 70s to 80s.  Patient has yet to receive her Coreg.  Suspect these will improve when Coreg is provided. -Continuous cardiac monitoring -Continue Coreg 12.5 mg twice daily  Positive hCG Patient's qualitative hCG was positive.  However quantitative hCG was unremarkable.  No further active intervention required.  Anxiety Patient is experiencing anxiety worsening over recent hospitalization.  She has home medications for anxiety. -Restart home medication dosing of Atarax 10 mg 3 times daily as needed  FEN/GI: N.p.o. PPx: SCDs Dispo:Home today or tomorrow. Barriers include EGD/colonoscopy, nephro recs.   Subjective:  Pt states she is doing well today. Denies chest pain or  diffficulty breathing. She is still having dry heaving but no vomiting. She is aware that she will get HD and EGD (possibly also colonoscopy) tomorrow. She is feeling anxious at this time and requesting anxiety medication.  Objective: Temp:  [97.4 F (36.3 C)-98.2 F (36.8 C)] 98.1 F (36.7 C) (12/20 0356) Pulse Rate:  [32-128] 102 (12/20 0356) Resp:  [16-30] 18 (12/20 0356) BP: (132-162)/(63-86) 150/82 (12/20 0356) SpO2:  [35 %-100 %] 99 % (12/20 0356) Weight:  [46.1 kg-47 kg] 46.1 kg (12/20 0027) Physical Exam: General: A&O x3, NAD Cardiovascular: borderline tachycardic, murmur auscultated Respiratory: CTAB, no increased work of breathing Abdomen: soft, normoactive bowel sounds  Laboratory: Recent Labs  Lab 12/15/21 1118 12/15/21 1612 12/16/21 0126  WBC 5.1 5.5 4.3  HGB 12.5 11.2* 11.5*  HCT 40.8 36.2 36.2  PLT 363 345 268   Recent Labs  Lab 12/15/21 1118 12/15/21 1408 12/15/21 1612 12/15/21 2247 12/16/21 0126  NA 135   < > 132* 132* 131*  K >7.5*   < > 3.1* 3.8 4.3  CL 94*   < > 96* 95* 94*  CO2 14*   < > 24 24 23   BUN 117*   < > 23* 29* 31*  CREATININE 13.60*   < > 3.82* 5.59* 5.89*  CALCIUM 8.4*   < > 8.4* 8.6* 8.8*  PROT 9.2*  --   --   --   --   BILITOT 0.8  --   --   --   --   ALKPHOS 110  --   --   --   --   ALT 18  --   --   --   --  AST 25  --   --   --   --   GLUCOSE 92   < > 83 75 76   < > = values in this interval not displayed.   Imaging/Diagnostic Tests: No results found.  Wells Guiles, DO 12/16/2021, 7:03 AM PGY-1, [place] Intern pager: [phone], text pages welcome

## 2021-12-16 NOTE — Progress Notes (Addendum)
Pt. HR up to 240s while sitting up in bed. Pt. Stated she was nauseated and felt like she had to throw up but nothing was coming out because she had not had food since Saturday. On call for family medicine paged to make aware. On coming RN made aware and at pts. Bedside. HR back down to 102. Pt. Alert and stable. No distress noted.

## 2021-12-16 NOTE — Progress Notes (Addendum)
Daily Rounding Note  12/16/2021, 8:52 AM  LOS: 0 days   SUBJECTIVE:   Chief complaint:    Nausea, vomiting, hematemesis, diarrhea.  Still with dry heaves.  Last episode of watery brown stool was last night.  Not a lot of abdominal pain.  She has a sense of hunger and dry mouth being n.p.o.  OBJECTIVE:         Vital signs in last 24 hours:    Temp:  [97.4 F (36.3 C)-98.2 F (36.8 C)] 98.1 F (36.7 C) (12/20 0732) Pulse Rate:  [32-128] 98 (12/20 0732) Resp:  [16-30] 18 (12/20 0356) BP: (132-162)/(63-86) 149/83 (12/20 0732) SpO2:  [35 %-100 %] 99 % (12/20 0732) Weight:  [46.1 kg-47 kg] 46.1 kg (12/20 0027) Last BM Date: 12/15/21 Filed Weights   12/15/21 0924 12/16/21 0027  Weight: 47 kg 46.1 kg   General: Looks chronically ill.  Comfortable, easily arousable from her rest. Heart: Systolic murmur Chest: Clear bilaterally no labored breathing. Abdomen: Soft, not tender.  Not distended.  Bowel sounds present. Extremities: No CCE. Neuro/Psych: Alert.  Oriented x3.  Cooperative.  Calm.  Intake/Output from previous day: 12/19 0701 - 12/20 0700 In: 240 [P.O.:240] Out: 0   Intake/Output this shift: No intake/output data recorded.  Lab Results: Recent Labs    12/15/21 1118 12/15/21 1612 12/16/21 0126  WBC 5.1 5.5 4.3  HGB 12.5 11.2* 11.5*  HCT 40.8 36.2 36.2  PLT 363 345 268   BMET Recent Labs    12/15/21 1612 12/15/21 2247 12/16/21 0126  NA 132* 132* 131*  K 3.1* 3.8 4.3  CL 96* 95* 94*  CO2 24 24 23   GLUCOSE 83 75 76  BUN 23* 29* 31*  CREATININE 3.82* 5.59* 5.89*  CALCIUM 8.4* 8.6* 8.8*   LFT Recent Labs    12/15/21 1118 12/15/21 1408 12/15/21 1612 12/15/21 2247 12/16/21 0126  PROT 9.2*  --   --   --   --   ALBUMIN 2.6*   < > 2.5* 2.4* 2.4*  AST 25  --   --   --   --   ALT 18  --   --   --   --   ALKPHOS 110  --   --   --   --   BILITOT 0.8  --   --   --   --    < > = values in this  interval not displayed.   PT/INR Recent Labs    12/15/21 1118  LABPROT 16.7*  INR 1.4*   Hepatitis Panel Recent Labs    12/15/21 1612  HEPBSAG NON REACTIVE    Studies/Results: No results found.  ASSESMENT:   Nausea, vomiting, diarrhea.  Diarrhea is last few days, N/V for many  weeks but worse,  evolving to CGE.   Never had intended EGD, colonoscopy for symptoms and FOBT positive stool as cardiac clearance had not been obtained.  Positive urine pregnancy, beta hCG not elevated.  Per resident note, DO D[place]bura: "No further active intervention required".    Hyperkalemia in setting of missed HD.  Currently normal at 4.3 following HD.  Marland Kitchen  ESRD    CHF.  EF 30 to 35%.  Lungs currently clear on auscultation and no symptoms of acute CHF flare.  Anemia.  Chronic.  Hgb actually improved compared with June, though it fluctuates quite a bit.  12.5 >> 11.5 since arrival.   Low iron, TIBC, iron sats,  transferrin.  Elevated ferritin.  Hyponatremia.  Isolated hyperglycemia.  Patient is not a diagnosed diabetic and since the elevated reading in January she has been at or below normal serum glucose.   PLAN   EGD, +/- colonoscopy tomorrow.  It is likely she will not be able to tolerate a full bowel prep but will give it a try and may need to just pursue flex sig depending on how well she can clear out her bowel.  Patient agreeable.  Orders for clear liquid diet, gentle bowel prep and procedures placed.   Time set for 1245 tomorrow afternoon.  Pt agreeable.  Continue IV Protonix twice daily   Azucena Freed  12/16/2021, 8:52 AM Phone [phone]

## 2021-12-16 NOTE — Progress Notes (Signed)
Pt receives out-pt HD at [facility] on TTS. Pt arrives at 7:10 for 7:30 chair time. Will assist as needed.  Melven Sartorius Renal Navigator [phone]

## 2021-12-17 ENCOUNTER — Inpatient Hospital Stay (HOSPITAL_COMMUNITY): Payer: Medicaid Other | Admitting: Certified Registered Nurse Anesthetist

## 2021-12-17 ENCOUNTER — Encounter (HOSPITAL_COMMUNITY)
Admission: EM | Disposition: A | Discharge status: Home / Self Care | Payer: Self-pay | Source: Home / Self Care | Attending: Family Medicine

## 2021-12-17 ENCOUNTER — Encounter (HOSPITAL_COMMUNITY): Payer: Self-pay | Admitting: Family Medicine

## 2021-12-17 DIAGNOSIS — K2970 Gastritis, unspecified, without bleeding: Secondary | ICD-10-CM

## 2021-12-17 HISTORY — PX: BIOPSY: SHX5522

## 2021-12-17 HISTORY — PX: ESOPHAGOGASTRODUODENOSCOPY (EGD) WITH PROPOFOL: SHX5813

## 2021-12-17 LAB — CBC
HCT: 38.3 % (ref 36.0–46.0)
Hemoglobin: 11.9 g/dL — ABNORMAL LOW (ref 12.0–15.0)
MCH: 27 pg (ref 26.0–34.0)
MCHC: 31.1 g/dL (ref 30.0–36.0)
MCV: 87 fL (ref 80.0–100.0)
Platelets: 280 10*3/uL (ref 150–400)
RBC: 4.4 MIL/uL (ref 3.87–5.11)
RDW: 19.5 % — ABNORMAL HIGH (ref 11.5–15.5)
WBC: 3.9 10*3/uL — ABNORMAL LOW (ref 4.0–10.5)
nRBC: 1.8 % — ABNORMAL HIGH (ref 0.0–0.2)

## 2021-12-17 LAB — RENAL FUNCTION PANEL
Albumin: 2.3 g/dL — ABNORMAL LOW (ref 3.5–5.0)
Anion gap: 16 — ABNORMAL HIGH (ref 5–15)
BUN: 46 mg/dL — ABNORMAL HIGH (ref 6–20)
CO2: 20 mmol/L — ABNORMAL LOW (ref 22–32)
Calcium: 8.7 mg/dL — ABNORMAL LOW (ref 8.9–10.3)
Chloride: 96 mmol/L — ABNORMAL LOW (ref 98–111)
Creatinine, Ser: 8.67 mg/dL — ABNORMAL HIGH (ref 0.44–1.00)
GFR, Estimated: 6 mL/min — ABNORMAL LOW (ref >60)
Glucose, Bld: 66 mg/dL — ABNORMAL LOW (ref 70–99)
Phosphorus: 9.6 mg/dL — ABNORMAL HIGH (ref 2.5–4.6)
Potassium: 4.7 mmol/L (ref 3.5–5.1)
Sodium: 132 mmol/L — ABNORMAL LOW (ref 135–145)

## 2021-12-17 SURGERY — ESOPHAGOGASTRODUODENOSCOPY (EGD) WITH PROPOFOL
Anesthesia: Monitor Anesthesia Care

## 2021-12-17 MED ORDER — PROPOFOL 10 MG/ML IV BOLUS
INTRAVENOUS | Status: DC | PRN
  Administered 2021-12-17 (×4): 10 mg via INTRAVENOUS

## 2021-12-17 MED ORDER — LIDOCAINE 5 % EX PTCH: 1.0000 | MEDICATED_PATCH | CUTANEOUS | Status: DC

## 2021-12-17 MED ORDER — LIDOCAINE 2% (20 MG/ML) 5 ML SYRINGE
INTRAMUSCULAR | Status: DC | PRN
  Administered 2021-12-17: 40 mg via INTRAVENOUS

## 2021-12-17 MED ORDER — ONDANSETRON HCL 4 MG/2ML IJ SOLN
INTRAMUSCULAR | Status: DC | PRN
  Administered 2021-12-17: 4 mg via INTRAVENOUS

## 2021-12-17 MED ORDER — PROPOFOL 500 MG/50ML IV EMUL
INTRAVENOUS | Status: DC | PRN
  Administered 2021-12-17: 125 ug/kg/min via INTRAVENOUS

## 2021-12-17 MED ORDER — SODIUM CHLORIDE 0.9 % IV SOLN: INTRAVENOUS | Status: DC | PRN

## 2021-12-17 MED ORDER — PANTOPRAZOLE SODIUM 40 MG PO TBEC
40.0000 mg | DELAYED_RELEASE_TABLET | Freq: Two times a day (BID) | ORAL | Status: DC
  Administered 2021-12-17 – 2021-12-18 (×2): 40 mg via ORAL
  Filled 2021-12-17 (×2): qty 1

## 2021-12-17 SURGICAL SUPPLY — 15 items

## 2021-12-17 NOTE — Transfer of Care (Addendum)
Immediate Anesthesia Transfer of Care Note  Patient: [name]  Procedure(s) Performed: ESOPHAGOGASTRODUODENOSCOPY (EGD) WITH PROPOFOL BIOPSY  Patient Location: PACU  Anesthesia Type:MAC  Level of Consciousness: awake, alert  and patient cooperative  Airway & Oxygen Therapy: Patient Spontanous Breathing and Patient connected to nasal cannula oxygen  Post-op Assessment: Report given to RN and Post -op Vital signs reviewed and stable  Post vital signs: Reviewed and stable  Last Vitals:  Vitals Value Taken Time  BP 145/86   Temp    Pulse 85   Resp 14   SpO2 95%     Last Pain:  Vitals:   12/17/21 1405  TempSrc: Temporal  PainSc: 0-No pain         Complications: No notable events documented.

## 2021-12-17 NOTE — Interval H&P Note (Signed)
History and Physical Interval Note:  12/17/2021 2:41 PM  [name]  has presented today for surgery, with the diagnosis of Investigate nausea, vomiting, diarrhea..  The various methods of treatment have been discussed with the patient and family. After consideration of risks, benefits and other options for treatment, the patient has consented to  Procedure(s): ESOPHAGOGASTRODUODENOSCOPY (EGD) WITH PROPOFOL (N/A) COLONOSCOPY (N/A) as a surgical intervention.  The patient's history has been reviewed, patient examined, no change in status, stable for surgery.  I have reviewed the patient's chart and labs.  Questions were answered to the patient's satisfaction.    Patient only able to tolerate a little bit of her preparation and thus does not want to pursue a colonoscopy since she still has muddy brown stool.  Offered a flexible sigmoidoscopy but she defers on this.  We will plan to proceed with endoscopy to further evaluate and rule out etiologies for her nausea/vomiting and recent hematemesis.   [employer]

## 2021-12-17 NOTE — Progress Notes (Addendum)
Patient has not finished part 1 of the bowel prep states its too much fluid to drink and will not be taking the second part.Will encourage patient to drink as much as possible.

## 2021-12-17 NOTE — Anesthesia Procedure Notes (Signed)
Procedure Name: MAC Date/Time: 12/17/2021 3:00 PM Performed by: Janene Harvey, CRNA Pre-anesthesia Checklist: Patient identified, Emergency Drugs available, Suction available and Patient being monitored Patient Re-evaluated:Patient Re-evaluated prior to induction Oxygen Delivery Method: Nasal cannula Induction Type: IV induction Placement Confirmation: positive ETCO2 Dental Injury: Teeth and Oropharynx as per pre-operative assessment

## 2021-12-17 NOTE — Progress Notes (Signed)
Family Medicine Teaching Service Daily Progress Note Intern Pager: [phone]  Patient name: [name] Medical record number: [id number] Date of birth: June 05, 1981 Age: 40 y.o. Gender: female  Primary Care Provider: Camillia Herter, NP Consultants: Nephrology, GI Code Status: Full  Pt Overview and Major Events to Date:  12/19: admitted to [place], emergent HD 12/21: EGD and colonoscopy if able  Assessment and Plan: [name] is a 40 y.o. female presenting in metabolic disarray due to missed HD. 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  Nausea/vomiting with dry heaving   diarrhea All symptoms except dry heaving appear to have resolved since emergent HD.  GI plans to perform EGD and potentially colonoscopy today.  Patient has been n.p.o. since 5 AM for EGD planned at 1400.  Per charting, it appears patient was unable to complete bowel prep. -GI following, appreciate recs -EGD and perhaps colonoscopy today -Protonix 40 mg IV twice daily  ESRD 2/2 lupus on HD TTS   hyperphosphatemia, hypocalcemia Electrolyte abnormalities persisting after HD.  Given nature of ESRD, patient does have metabolic abnormalities.  These are persisting even with restarting PhosLo. Pt to receive HD today, will be addressed by nephrology. -Nephrology following, appreciate recs -HD today -Continue PhosLo  HTN   tachycardia Appears to have returned to normal since restarting home Coreg.  FEN/GI: N.p.o. at this time PPx: SCDs Dispo:Home today or tomorrow. Barriers include EGD procedure.   Subjective:  Patient states she is doing well and had questions about the EGD procedure.  She was unable to finish bowel prep for the colonoscopy.  She states that her dry heaving has resolved and is doing well at this time and HD.  Objective: Temp:  [98 F (36.7 C)-98.1 F (36.7 C)] 98.1 F (36.7 C) (12/21 0434) Pulse Rate:  [81-98] 83 (12/21  0434) Resp:  [15-20] 20 (12/21 0434) BP: (129-149)/(64-83) 129/64 (12/21 0434) SpO2:  [95 %-99 %] 97 % (12/21 0434) Weight:  [45.2 kg] 45.2 kg (12/21 0434) Physical Exam: General: A&O x3, NAD Cardiovascular: RRR, systolic murmur auscultated Respiratory: CTA B, no increased work of breathing Extremities: No lower extremity edema  Laboratory: Recent Labs  Lab 12/16/21 0126 12/16/21 1323 12/17/21 0421  WBC 4.3 3.4* 3.9*  HGB 11.5* 11.4* 11.9*  HCT 36.2 37.2 38.3  PLT 268 273 280   Recent Labs  Lab 12/15/21 1118 12/15/21 1408 12/16/21 0126 12/16/21 1323 12/17/21 0421  NA 135   < > 131* 133* 132*  K >7.5*   < > 4.3 5.0 4.7  CL 94*   < > 94* 94* 96*  CO2 14*   < > 23 24 20*  BUN 117*   < > 31* 36* 46*  CREATININE 13.60*   < > 5.89* 7.27* 8.67*  CALCIUM 8.4*   < > 8.8* 9.0 8.7*  PROT 9.2*  --   --   --   --   BILITOT 0.8  --   --   --   --   ALKPHOS 110  --   --   --   --   ALT 18  --   --   --   --   AST 25  --   --   --   --   GLUCOSE 92   < > 76 82 66*   < > = values in this interval not displayed.    Imaging/Diagnostic Tests: No results  found.  [name] Guiles, DO 12/17/2021, 6:45 AM PGY-1, [place] Intern pager: [phone], text pages welcome

## 2021-12-17 NOTE — Op Note (Signed)
[facility] Patient Name: [name] Procedure Date : 12/17/2021 MRN: [id number] Attending MD: Justice Britain , MD Date of Birth: 03/24/1981 CSN: [id number] Age: 40 Admit Type: Inpatient Procedure:                Upper GI endoscopy Indications:              Iron deficiency anemia, Coffee-ground emesis,                            Hematemesis, Nausea with vomiting Providers:                Justice Britain, MD, Elmer Ramp. Tilden Dome, RN, Dietitian, Technician Referring MD:             Milus Banister, MD, Medical Service, Lavone Nian Lemmon Medicines:                Monitored Anesthesia Care Complications:            No immediate complications. Estimated Blood Loss:     Estimated blood loss was minimal. Procedure:                Pre-Anesthesia Assessment:                           - Prior to the procedure, a History and Physical                            was performed, and patient medications and                            allergies were reviewed. The patient's tolerance of                            previous anesthesia was also reviewed. The risks                            and benefits of the procedure and the sedation                            options and risks were discussed with the patient.                            All questions were answered, and informed consent                            was obtained. Prior Anticoagulants: The patient has                            taken no previous anticoagulant or antiplatelet  agents. ASA Grade Assessment: III - A patient with                            severe systemic disease. After reviewing the risks                            and benefits, the patient was deemed in                            satisfactory condition to undergo the procedure.                           After obtaining informed consent, the endoscope was                             passed under direct vision. Throughout the                            procedure, the patient's blood pressure, pulse, and                            oxygen saturations were monitored continuously. The                            [id number]-H190 ([id number]) Olympus endoscope was introduced                            through the mouth, and advanced to the second part                            of duodenum. The upper GI endoscopy was                            accomplished without difficulty. The patient                            tolerated the procedure. Scope In: Scope Out: Findings:      White nummular lesions were noted in the entire esophagus. Biopsies were       taken with a cold forceps for histology to rule out Candida      The Z-line was irregular and was found 40 cm from the incisors.      A 3 cm hiatal hernia was present.      Segmental moderate inflammation characterized by erosions, erythema,       granularity were found in the gastric body, in the gastric antrum and in       the prepyloric region of the stomach. 2 shallow linear ulcers noted in       the antrum. Biopsies were taken with a cold forceps for histology and       Helicobacter pylori testing from the antrum/incisura/greater       curve/lesser curve/cardia.      No gross lesions were noted in the duodenal bulb, in the first portion       of the duodenum and in the second portion of the duodenum. Biopsies were  taken with a cold forceps for histology/HP evaluation. Impression:               - White nummular lesions in esophageal mucosa.                            Biopsied.                           - Z-line irregular, 40 cm from the incisors.                           - 3 cm hiatal hernia.                           - Gastritis and gastric ulcers noted. Biopsied.                           - No gross lesions in the duodenal bulb, in the                            first portion of the duodenum and in  the second                            portion of the duodenum. Biopsied. Recommendation:           - The patient will be observed post-procedure,                            until all discharge criteria are met.                           - Return patient to hospital ward for ongoing care.                           - Await pathology results.                           - Observe patient's clinical course.                           - Repeat upper endoscopy in 4 months to check                            healing. Hopefully, she will be able to have this                            done at same time as Colonoscopy that had                            previously been recommended.                           - Transition IV PPI BID to PO PPI BID for now.                           -  Consider Carafate in future.                           - Return to GI office.                           - The findings and recommendations were discussed                            with the patient.                           - The findings and recommendations were discussed                            with the referring physician. Procedure Code(s):        --- Professional ---                           [phone], Esophagogastroduodenoscopy, flexible,                            transoral; with biopsy, single or multiple Diagnosis Code(s):        --- Professional ---                           K22.8, Other specified diseases of esophagus                           K44.9, Diaphragmatic hernia without obstruction or                            gangrene                           K29.70, Gastritis, unspecified, without bleeding                           D50.9, Iron deficiency anemia, unspecified                           K92.0, Hematemesis                           R11.2, Nausea with vomiting, unspecified CPT copyright 2019 American Medical Association. All rights reserved. The codes documented in this report are preliminary and upon  coder review may  be revised to meet current compliance requirements. Justice Britain, MD 12/17/2021 3:36:00 PM Number of Addenda: 0

## 2021-12-17 NOTE — Procedures (Signed)
I was present at this dialysis session. I have reviewed the session itself and made appropriate changes.   Vital signs in last 24 hours:  Temp:  [97.9 F (36.6 C)-98.1 F (36.7 C)] 97.9 F (36.6 C) (12/21 0733) Pulse Rate:  [79-87] 79 (12/21 0733) Resp:  [15-20] 20 (12/21 0434) BP: (129-134)/(64-74) 134/69 (12/21 0733) SpO2:  [95 %-98 %] 98 % (12/21 0733) Weight:  [45.2 kg] 45.2 kg (12/21 0434) Weight change: -1.776 kg Filed Weights   12/15/21 0924 12/16/21 0027 12/17/21 0434  Weight: 47 kg 46.1 kg 45.2 kg    Recent Labs  Lab 12/17/21 0421  NA 132*  K 4.7  CL 96*  CO2 20*  GLUCOSE 66*  BUN 46*  CREATININE 8.67*  CALCIUM 8.7*  PHOS 9.6*    Recent Labs  Lab 12/15/21 1118 12/15/21 1612 12/16/21 0126 12/16/21 1323 12/17/21 0421  WBC 5.1   < > 4.3 3.4* 3.9*  NEUTROABS 3.4  --   --   --   --   HGB 12.5   < > 11.5* 11.4* 11.9*  HCT 40.8   < > 36.2 37.2 38.3  MCV 87.4   < > 85.6 87.1 87.0  PLT 363   < > 268 273 280   < > = values in this interval not displayed.    Scheduled Meds:  albuterol  10 mg Nebulization Once   calcium acetate  2,668 mg Oral TID with meals   carvedilol  12.5 mg Oral BID WC   Chlorhexidine Gluconate Cloth  6 each Topical Q0600   heparin  4,000 Units Dialysis Once in dialysis   pantoprazole (PROTONIX) IV  40 mg Intravenous BID   peg 3350 powder  0.5 kit Oral Once   Continuous Infusions:  sodium chloride     sodium chloride     PRN Meds:.sodium chloride, sodium chloride, acetaminophen, alteplase, calcium acetate, heparin, heparin, hydrOXYzine, lidocaine (PF), lidocaine-prilocaine, pentafluoroprop-tetrafluoroeth    Dialysis Orders:  TTS - [facility]  3hrs42mn, BFR 400, DFR Auto 1.5,  [id number] 2K/ 2Ca Heparin 4000 unit bolus Mircera 100 mcg q2wks - last 12/11/21 Home meds: Sensipar 374mQHS; Sevelamer 80049m tabs TID with meals   Assessment/Plan: Nausea/Vomiting/Diarrhea-Ongoing over weekend. GI now following. Plan  for NPO after midnight for Colonoscopy and EGD on 12/21. Hyperkalemia - resolved with urgent HD on 12/15/21 ESRD - Missed HD 12/18. Hyperkalemic, tachycardiac, and elevated Bps, s/p emergent HD 12/15/21.  Will get her back on schedule this week.  Plan for HD today and then again on Saturday if still here for outpatient schedule.  Hypertension/volume  - Bps elevated, HD today, continue Coreg.  Anemia of CKD - Hgb 12.5; No ESA/Fe indicative at this time. Follow trends Secondary Hyperparathyroidism -  Corr Ca ok; will check PO4 in AM. Nutrition - npo for procedures per GI.   JosDonetta PottsMD 12/17/2021, 8:42 AM

## 2021-12-17 NOTE — Anesthesia Preprocedure Evaluation (Signed)
Anesthesia Evaluation  Patient identified by MRN, date of birth, ID band Patient awake    Reviewed: Allergy & Precautions, H&P , NPO status , Patient's Chart, lab work & pertinent test results  Airway Mallampati: II   Neck ROM: full    Dental   Pulmonary shortness of breath, Current Smoker and Patient abstained from smoking.,    breath sounds clear to auscultation       Cardiovascular hypertension, Pt. on home beta blockers +CHF   Rhythm:regular Rate:Normal     Neuro/Psych  Headaches, Seizures -,     GI/Hepatic   Endo/Other    Renal/GU ESRFRenal diseaselupus     Musculoskeletal  (+) Arthritis ,   Abdominal   Peds  Hematology   Anesthesia Other Findings   Reproductive/Obstetrics                             Anesthesia Physical  Anesthesia Plan  ASA: III  Anesthesia Plan: MAC   Post-op Pain Management:    Induction: Intravenous  PONV Risk Score and Plan: 2 and Propofol infusion and TIVA  Airway Management Planned: Natural Airway and Mask  Additional Equipment: None  Intra-op Plan:   Post-operative Plan: Extubation in OR  Informed Consent: I have reviewed the patients History and Physical, chart, labs and discussed the procedure including the risks, benefits and alternatives for the proposed anesthesia with the patient or authorized representative who has indicated his/her understanding and acceptance.     Dental advisory given  Plan Discussed with: CRNA  Anesthesia Plan Comments:         Anesthesia Quick Evaluation

## 2021-12-17 NOTE — Progress Notes (Addendum)
FPTS Brief Progress Note  S: [name] was seen asleep comfortably this p.m.  Did not awaken patient.   O: BP 132/72 (BP Location: Left Arm)    Pulse 82    Temp 98 F (36.7 C) (Oral)    Resp 15    Ht 5\' 4"  (1.626 m)    Wt 46.1 kg    SpO2 98%    BMI 17.45 kg/m   Physical Exam Vitals and nursing note reviewed.  Constitutional:      General: She is sleeping. She is not in acute distress.    Appearance: She is not ill-appearing, toxic-appearing or diaphoretic.  Cardiovascular:     Rate and Rhythm: Normal rate and regular rhythm.  Pulmonary:     Effort: Pulmonary effort is normal. No tachypnea, bradypnea or respiratory distress.    A/P: Hyperkalemia Repeat EKG showed sinus rhythm with first-degree AV block, resolved peaked T waves, improved QTc, and discernible P waves.  Nausea/vomiting, or diarrhea Per chart, patient was not able to finish her bowel prep, therefore she may or may not receive colonoscopy tomorrow. GI following, appreciate assistance and recommendation N.p.o. at midnight Plan for EGD and colonoscopy tomorrow IV Protonix 40 mg BID Daily CBC, RFP, INR Orders reviewed. Labs for AM ordered, which was adjusted as needed.   Remainder of plan per day team/daily progress note.  Merrily Brittle, DO 12/17/2021, 1:32 AM PGY-1, [facility] Night Resident  Please page [phone] with questions.

## 2021-12-17 NOTE — Plan of Care (Signed)

## 2021-12-18 ENCOUNTER — Other Ambulatory Visit (HOSPITAL_COMMUNITY): Payer: Self-pay

## 2021-12-18 ENCOUNTER — Encounter (HOSPITAL_COMMUNITY): Payer: Self-pay | Admitting: Gastroenterology

## 2021-12-18 LAB — RENAL FUNCTION PANEL
Albumin: 2.4 g/dL — ABNORMAL LOW (ref 3.5–5.0)
Anion gap: 12 (ref 5–15)
BUN: 29 mg/dL — ABNORMAL HIGH (ref 6–20)
CO2: 26 mmol/L (ref 22–32)
Calcium: 8.8 mg/dL — ABNORMAL LOW (ref 8.9–10.3)
Chloride: 92 mmol/L — ABNORMAL LOW (ref 98–111)
Creatinine, Ser: 5.76 mg/dL — ABNORMAL HIGH (ref 0.44–1.00)
GFR, Estimated: 9 mL/min — ABNORMAL LOW (ref >60)
Glucose, Bld: 83 mg/dL (ref 70–99)
Phosphorus: 7.1 mg/dL — ABNORMAL HIGH (ref 2.5–4.6)
Potassium: 4 mmol/L (ref 3.5–5.1)
Sodium: 130 mmol/L — ABNORMAL LOW (ref 135–145)

## 2021-12-18 LAB — TSH: TSH: 2.536 u[IU]/mL (ref 0.350–4.500)

## 2021-12-18 LAB — CORTISOL-AM, BLOOD: Cortisol - AM: 18.1 ug/dL (ref 6.7–22.6)

## 2021-12-18 MED ORDER — PANTOPRAZOLE SODIUM 40 MG PO TBEC
40.0000 mg | DELAYED_RELEASE_TABLET | Freq: Two times a day (BID) | ORAL | 0 refills | Status: DC
  Filled 2021-12-18: qty 60, 30d supply, fill #0

## 2021-12-18 MED ORDER — PROCHLORPERAZINE MALEATE 10 MG PO TABS
10.0000 mg | ORAL_TABLET | Freq: Once | ORAL | Status: AC
  Administered 2021-12-18: 08:00:00 10 mg via ORAL
  Filled 2021-12-18: qty 1

## 2021-12-18 MED ORDER — SEVELAMER CARBONATE 800 MG PO TABS
800.0000 mg | ORAL_TABLET | Freq: Three times a day (TID) | ORAL | 0 refills | Status: AC
  Filled 2021-12-18: qty 90, 30d supply, fill #0

## 2021-12-18 MED ORDER — ONDANSETRON 4 MG PO TBDP
4.0000 mg | ORAL_TABLET | Freq: Three times a day (TID) | ORAL | 0 refills | Status: DC | PRN
  Filled 2021-12-18: qty 20, 7d supply, fill #0

## 2021-12-18 NOTE — Progress Notes (Signed)
Patient ID: MEGAHN KILLINGS, female   DOB: 12-01-1981, 40 y.o.   MRN: [id number] S:Feeling better this morning.  O:BP 137/65 (BP Location: Left Arm)    Pulse 78    Temp 98 F (36.7 C) (Oral)    Resp 18    Ht _0  (1.626 m)    Wt 45.3 kg    SpO2 92%    BMI 17.15 kg/m   Intake/Output Summary (Last 24 hours) at 12/18/2021 1007 Last data filed at 12/18/2021 6644 Gross per 24 hour  Intake 475 ml  Output 0 ml  Net 475 ml   Intake/Output: I/O last 3 completed shifts: In: 034 [P.O.:525; I.V.:250] Out: 0   Intake/Output this shift:  Total I/O In: 100 [P.O.:100] Out: -  Weight change: 0.076 kg Gen: NAD CVS: RRR Resp:CTA Abd: +BS, soft, NT/ND Ext: no edema, RUE AVG +T/B  Recent Labs  Lab 12/15/21 1118 12/15/21 1408 12/15/21 1612 12/15/21 2247 12/16/21 0126 12/16/21 1323 12/17/21 0421 12/18/21 0628  NA 135 136 132* 132* 131* 133* 132* 130*  K >7.5* 6.6* 3.1* 3.8 4.3 5.0 4.7 4.0  CL 94* 95* 96* 95* 94* 94* 96* 92*  CO2 14* 18* _1 20* 26  GLUCOSE 92 70 83 75 76 82 66* 83  BUN 117* 121* 23* 29* 31* 36* 46* 29*  CREATININE 13.60* 13.51* 3.82* 5.59* 5.89* 7.27* 8.67* 5.76*  ALBUMIN 2.6* 2.4* 2.5* 2.4* 2.4* 2.4* 2.3* 2.4*  CALCIUM 8.4* 8.3* 8.4* 8.6* 8.8* 9.0 8.7* 8.8*  PHOS  --  >30.0* 3.8 6.2* 7.0* 8.7* 9.6* 7.1*  AST 25  --   --   --   --   --   --   --   ALT 18  --   --   --   --   --   --   --    Liver Function Tests: Recent Labs  Lab 12/15/21 1118 12/15/21 1408 12/16/21 1323 12/17/21 0421 12/18/21 0628  AST 25  --   --   --   --   ALT 18  --   --   --   --   ALKPHOS 110  --   --   --   --   BILITOT 0.8  --   --   --   --   PROT 9.2*  --   --   --   --   ALBUMIN 2.6*   < > 2.4* 2.3* 2.4*   < > = values in this interval not displayed.   No results for input(s): LIPASE, AMYLASE in the last 168 hours. No results for input(s): AMMONIA in the last 168 hours. CBC: Recent Labs  Lab 12/15/21 1118 12/15/21 1612 12/16/21 0126 12/16/21 1323 12/17/21 0421   WBC 5.1 5.5 4.3 3.4* 3.9*  NEUTROABS 3.4  --   --   --   --   HGB 12.5 11.2* 11.5* 11.4* 11.9*  HCT 40.8 36.2 36.2 37.2 38.3  MCV 87.4 85.8 85.6 87.1 87.0  PLT 363 345 268 273 280   Cardiac Enzymes: No results for input(s): CKTOTAL, CKMB, CKMBINDEX, TROPONINI in the last 168 hours. CBG: No results for input(s): GLUCAP in the last 168 hours.  Iron Studies: No results for input(s): IRON, TIBC, TRANSFERRIN, FERRITIN in the last 72 hours. Studies/Results: No results found.  albuterol  10 mg Nebulization Once   calcium acetate  2,668 mg Oral TID with meals   carvedilol  12.5 mg Oral BID WC  Chlorhexidine Gluconate Cloth  6 each Topical Q0600   heparin  4,000 Units Dialysis Once in dialysis   pantoprazole  40 mg Oral BID AC   peg 3350 powder  0.5 kit Oral Once    BMET    Component Value Date/Time   NA 130 (L) 12/18/2021 0628   K 4.0 12/18/2021 0628   CL 92 (L) 12/18/2021 0628   CO2 26 12/18/2021 0628   GLUCOSE 83 12/18/2021 0628   BUN 29 (H) 12/18/2021 0628   CREATININE 5.76 (H) 12/18/2021 0628   CALCIUM 8.8 (L) 12/18/2021 0628   CALCIUM 6.7 (L) 01/31/2014 1309   GFRNONAA 9 (L) 12/18/2021 0628   GFRAA 9 (L) 08/02/2020 1049   CBC    Component Value Date/Time   WBC 3.9 (L) 12/17/2021 0421   RBC 4.40 12/17/2021 0421   HGB 11.9 (L) 12/17/2021 0421   HCT 38.3 12/17/2021 0421   PLT 280 12/17/2021 0421   MCV 87.0 12/17/2021 0421   MCH 27.0 12/17/2021 0421   MCHC 31.1 12/17/2021 0421   RDW 19.5 (H) 12/17/2021 0421   LYMPHSABS 1.3 12/15/2021 1118   MONOABS 0.4 12/15/2021 1118   EOSABS 0.0 12/15/2021 1118   BASOSABS 0.0 12/15/2021 1118    Dialysis Orders:  TTS - [facility]  3hrs63mn, BFR 400, DFR Auto 1.5,  EBTY60AY 2K/ 2Ca Heparin 4000 unit bolus Mircera 100 mcg q2wks - last 12/11/21 Home meds: Sensipar 32mQHS; Sevelamer 80027m tabs TID with meals   Assessment/Plan: Nausea/Vomiting/Diarrhea- EGD on 12/17/21 which revealed nummular lesions  along the esophagus, gastritis, HH, and gastric ulcers s/p biopsies.  Startred on protonix.  Cannot use Carafate in ESRD.  Follow up with GI as an outpatient to review biopsy results.  Hyperkalemia - resolved with urgent HD on 12/15/21 ESRD - Missed HD 12/18. Hyperkalemic, tachycardiac, and elevated Bps, s/p emergent HD 12/15/21.  Completed HD 12/17/21 without issue.  Will resume outpatient HD on Saturday 12/20/21.  Hypertension/volume  - Bps elevated, HD today, continue Coreg.  Anemia of CKD - Hgb 12.5; No ESA/Fe indicative at this time. Follow trends Secondary Hyperparathyroidism -  Corr Ca ok; will check PO4 in AM. Nutrition - npo for procedures per GI.  Disposition - stable for discharge from renal standpoint.   JosDonetta PottsD [phone]

## 2021-12-18 NOTE — Progress Notes (Signed)
Pt to d/c today. Contacted [facility] and made clinic aware pt will resume on Saturday.   Melven Sartorius Renal Navigator [phone]

## 2021-12-18 NOTE — Anesthesia Postprocedure Evaluation (Signed)
Anesthesia Post Note  Patient: [name]  Procedure(s) Performed: ESOPHAGOGASTRODUODENOSCOPY (EGD) WITH PROPOFOL BIOPSY     Patient location during evaluation: Endoscopy Anesthesia Type: MAC Level of consciousness: awake and sedated Pain management: pain level controlled Vital Signs Assessment: post-procedure vital signs reviewed and stable Respiratory status: spontaneous breathing Cardiovascular status: stable Postop Assessment: no apparent nausea or vomiting Anesthetic complications: no   No notable events documented.  Last Vitals:  Vitals:   12/18/21 0454 12/18/21 0813  BP: 134/69 137/65  Pulse: 80 78  Resp: 18 18  Temp: 36.8 C 36.7 C  SpO2:  92%    Last Pain:  Vitals:   12/18/21 0813  TempSrc: Oral  PainSc: 0-No pain                 Huston Foley

## 2021-12-18 NOTE — Discharge Summary (Addendum)
[facility] Discharge Summary  Patient name: [name] Medical record number: [id number] Date of birth: 09/19/1981 Age: 40 y.o. Gender: female Date of Admission: 12/15/2021  Date of Discharge: 12/18/2021 Admitting Physician: Martyn Malay, MD  Primary Care Provider: Camillia Herter, NP Consultants: Nephrology, GI  Indication for Hospitalization: Metabolic disarray in the setting of missed dialysis  Discharge Diagnoses/Problem List:  Principal Problem:   Hyperkalemia, diminished renal excretion Active Problems:   SLE (systemic lupus erythematosus) ([place])   Lupus nephritis ([place])   Microcytic anemia   ESRD on dialysis [facility])   Essential hypertension    Disposition: Home  Discharge Condition: Stable  Discharge Exam:  Blood pressure 137/65, pulse 78, temperature 98 F (36.7 C), temperature source Oral, resp. rate 18, height 5\' 4"  (1.626 m), weight 45.3 kg, SpO2 92 %.  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 She was found to have peaked T waves on EKG, anion gap 27, K>7.5, Mg 2.9.  She received calcium gluconate, insulin, D5, albuterol nebulization for hyperkalemia in the ED.  She was taken for emergent HD. Her electrolytes were normalized as able and she was restarted on her ESRD medications.  Peptic Ulcer Disease Hematemesis was noted in patient's vomiting prior to ED presentation.  She also had positive Hemoccult study  on 08/06/2021.  GI consulted.  Patient received EGD which showed potential Candida esophagitis, no evidence of Mallory-Weiss tear.  Patient had significant peptic ulcer disease in the setting of gastritis and gastropathy as well as 2 linear ulcers.  These were biopsied and will be followed up by GI.  Patient did not receive colonoscopy as this is opted for outpatient.  Patient was recommended to follow-up with GI in the outpatient setting.  Other chronic conditions were medically managed with home medications and formulary alternatives as necessary (CHF, HTN)  Follow-up Recommendations: Colonoscopy with GI in the outpatient setting Repeat EGD for ulcer healing surveillance in 2 to 3 months with GI  Significant Procedures: EGD  Significant Labs and Imaging:  Recent Labs  Lab 12/16/21 0126 12/16/21 1323 12/17/21 0421  WBC 4.3 3.4* 3.9*  HGB 11.5* 11.4* 11.9*  HCT 36.2 37.2 38.3  PLT 268 273 280   Recent Labs  Lab 12/15/21 1118 12/15/21 1408 12/15/21 2247 12/16/21 0126 12/16/21 1323 12/17/21 0421 12/18/21 0628  NA 135   < > 132* 131* 133* 132* 130*  K >7.5*   < > 3.8 4.3 5.0 4.7 4.0  CL 94*   < > 95* 94* 94* 96* 92*  CO2 14*   < > 24 23 24  20* 26  GLUCOSE 92   < > 75 76 82 66* 83  BUN 117*   < > 29* 31* 36* 46* 29*  CREATININE 13.60*   < > 5.59* 5.89* 7.27* 8.67* 5.76*  CALCIUM 8.4*   < > 8.6* 8.8* 9.0 8.7* 8.8*  MG 2.9*  --   --  2.2  --   --   --  PHOS  --    < > 6.2* 7.0* 8.7* 9.6* 7.1*  ALKPHOS 110  --   --   --   --   --   --   AST 25  --   --   --   --   --   --   ALT 18  --   --   --   --   --   --   ALBUMIN 2.6*   < > 2.4* 2.4* 2.4* 2.3* 2.4*   < > = values in this interval not displayed.   Results/Tests Pending at Time of Discharge: Bx pathology - to be followed up by GI.  Discharge Medications:  Allergies as of 12/18/2021       Reactions   Cephalosporins Rash   To both keflex and cefazolin   Tobramycin Sulfate Swelling   Eye swelling         Medication List     STOP taking these medications    calcitRIOL 0.5 MCG capsule Commonly known as: ROCALTROL       TAKE these medications    acetaminophen 650 MG CR tablet Commonly known as: TYLENOL Take 1,300 mg by mouth every 8 (eight) hours as needed for pain.   calcium acetate 667 MG capsule Commonly known as: PHOSLO Take 1,334-2,668 mg by mouth See admin instructions. Take 4 capsule (2668 mg) by mouth three times a day with each meal and 2 capsules (1334 mg) with each snack   carvedilol 12.5 MG tablet Commonly known as: COREG Take 12.5 mg by mouth 2 (two) times daily with a meal.   diphenhydrAMINE 25 MG tablet Commonly known as: BENADRYL Take 1 tablet (25 mg total) by mouth every 4 (four) hours as needed for itching or allergies.   doxercalciferol 4 MCG/2ML injection Commonly known as: HECTOROL Use as recommended by nephrology   hydrOXYzine 10 MG tablet Commonly known as: ATARAX Take 1 tablet (10 mg total) by mouth 3 (three) times daily as needed for anxiety.   loperamide 2 MG tablet Commonly known as: IMODIUM A-D Take 4 mg by mouth daily as needed for diarrhea or loose stools.   multivitamin Tabs tablet Take 1 tablet by mouth daily.   [employer] Renal Liqd Take 237 mLs by mouth See admin instructions. Take 1 container (237 mls) by at bedtime on dialysis days (Tuesday, Thursday, Saturday)   ondansetron 4 MG disintegrating tablet Commonly known as: ZOFRAN-ODT Take 1 tablet (4 mg total) by mouth every 8 (eight) hours as needed for nausea or vomiting.   pantoprazole 40 MG tablet Commonly known as: PROTONIX Take 1 tablet (40 mg total) by mouth 2 (two) times daily before a meal.   sevelamer carbonate 800 MG tablet Commonly known as: RENVELA Take 1 tablet (800 mg total) by mouth 3 (three) times daily with meals.        Discharge Instructions: Please refer to Patient Instructions section of EMR for full details.  Patient was counseled important signs and  symptoms that should prompt return to medical care, changes in medications, dietary instructions, activity restrictions, and follow up appointments.   Follow-Up Appointments: No future appointments.  Wells Guiles, DO 12/18/2021, 10:35 AM PGY-1, [place]

## 2021-12-18 NOTE — Progress Notes (Signed)
FPTS Brief Progress Note  S: [name] was seen this p.m. sleeping comfortably.  Did not awaken patient.  O: BP 139/62 (BP Location: Left Arm)    Pulse 79    Temp 98.7 F (37.1 C) (Oral)    Resp 15    Ht 5\' 4"  (1.626 m)    Wt 45.3 kg    SpO2 96%    BMI 17.14 kg/m   Physical Exam Vitals and nursing note reviewed.  Constitutional:      General: She is sleeping. She is not in acute distress.    Appearance: She is not ill-appearing, toxic-appearing or diaphoretic.  Cardiovascular:     Rate and Rhythm: Normal rate and regular rhythm.  Pulmonary:     Effort: Pulmonary effort is normal. No tachypnea, bradypnea or respiratory distress.    A/P: Possible peptic ulcer disease   gastritis On EGD showed white nummular lesions in the esophageal mucosa was biopsied, irregular Z-line, hiatal hernia, gastritis, peptic ulcer that were biopsied. GI following, appreciate assistance and recommendations Pending pathology results Repeat EGD in 4 months with colonoscopy Transition IV PPI BID to p.o. Consider Carafate in the future Follow-up with GI outpatient Orders reviewed. Labs for AM ordered, which was adjusted as needed.   Remainder of plan per day team/daily progress note.  Merrily Brittle, DO 12/18/2021, 2:16 AM PGY-1, [facility] Night Resident  Please page [phone] with questions.

## 2021-12-18 NOTE — Discharge Instructions (Addendum)
Dear [name],   Thank you for letting us participate in your care! In this section, you will find a brief hospital admission summary of why you were admitted to the hospital, what happened during your admission, your diagnosis/diagnoses, and recommended follow up.  You were admitted because you were experiencing nausea/vomiting/diarrhea. You had electrolyte abnormalities due to missing dialysis. You received dialysis and everything appeared normal afterwards. You also received an EGD for blood in your vomit. You were found to have peptic ulcer disease and biopsies were taken. Please follow up with GI for another EGD and colonoscopy in the future.  POST-HOSPITAL & CARE INSTRUCTIONS Please let PCP/Specialists know of any changes in medications that were made.  Please see medications section of this packet for any medication changes.   DOCTOR'S APPOINTMENTS & FOLLOW UP No future appointments.  Thank you for choosing [facility]! Take care and be well!  [facility]  [place],  [address] [phone]

## 2021-12-19 ENCOUNTER — Telehealth: Payer: Self-pay

## 2021-12-19 NOTE — Telephone Encounter (Signed)
Transition Care Management Unsuccessful Follow-up Telephone Call  Date of discharge and from where:  [facility] on 12/18/2021  Attempts:  1st Attempt  Reason for unsuccessful TCM follow-up call:  Left voice message unable to reach at this time.  Pt need to schedule HFU appt with PCP

## 2021-12-20 ENCOUNTER — Telehealth: Payer: Self-pay | Admitting: Nurse Practitioner

## 2021-12-20 NOTE — Telephone Encounter (Signed)
Transition of care contact from inpatient facility  Date of Discharge: 12/18/2021 Date of Contact: 12/20/2021 Method of contact: Phone  Attempted to contact patient to discuss transition of care from inpatient admission. Patient did not answer the phone. Message was left on the patient's voicemail with call back number [phone].

## 2021-12-22 ENCOUNTER — Telehealth: Payer: Self-pay

## 2021-12-22 NOTE — Telephone Encounter (Signed)
Transition Care Management Unsuccessful Follow-up Telephone Call   Date of discharge and from where:  [facility] on 12/18/2021   Attempts:  2nd Attempt   Reason for unsuccessful TCM follow-up call:  Left voice message unable to reach at this time.  Pt need to schedule HFU appt with PCP

## 2021-12-23 ENCOUNTER — Encounter: Payer: Self-pay | Admitting: Gastroenterology

## 2021-12-23 ENCOUNTER — Telehealth: Payer: Self-pay

## 2021-12-23 ENCOUNTER — Other Ambulatory Visit: Payer: Self-pay

## 2021-12-23 LAB — SURGICAL PATHOLOGY

## 2021-12-23 MED ORDER — FLUCONAZOLE 200 MG PO TABS: ORAL_TABLET | ORAL | 0 refills | Status: AC

## 2021-12-23 NOTE — Telephone Encounter (Signed)
Transition Care Management Unsuccessful Follow-up Telephone Call  Date of discharge and from where:  12/18/2021, [facility]   Attempts:  3rd Attempt  Reason for unsuccessful TCM follow-up call:  Left voice message on # [phone], this is also the phone number for patient's mother, Hassan Rowan.   Letter sent to patient requesting she contact [facility] to schedule visit with Durene Fruits, NP as we have not been able to reach her.

## 2022-02-10 ENCOUNTER — Ambulatory Visit: Payer: Medicaid Other | Admitting: Gastroenterology

## 2022-04-12 ENCOUNTER — Other Ambulatory Visit: Payer: Self-pay

## 2022-04-12 ENCOUNTER — Emergency Department (HOSPITAL_COMMUNITY): Payer: Medicaid Other

## 2022-04-12 ENCOUNTER — Encounter (HOSPITAL_COMMUNITY): Payer: Self-pay | Admitting: Emergency Medicine

## 2022-04-12 ENCOUNTER — Inpatient Hospital Stay (HOSPITAL_COMMUNITY)
Admission: EM | Admit: 2022-04-12 | Discharge: 2022-04-17 | DRG: 545 | Disposition: A | Discharge status: Home / Self Care | Payer: Medicaid Other | Attending: Internal Medicine | Admitting: Internal Medicine

## 2022-04-12 DIAGNOSIS — I5020 Unspecified systolic (congestive) heart failure: Secondary | ICD-10-CM

## 2022-04-12 DIAGNOSIS — R6510 Systemic inflammatory response syndrome (SIRS) of non-infectious origin without acute organ dysfunction: Secondary | ICD-10-CM

## 2022-04-12 DIAGNOSIS — M79602 Pain in left arm: Secondary | ICD-10-CM

## 2022-04-12 DIAGNOSIS — Z79899 Other long term (current) drug therapy: Secondary | ICD-10-CM

## 2022-04-12 DIAGNOSIS — M329 Systemic lupus erythematosus, unspecified: Secondary | ICD-10-CM | POA: Diagnosis present

## 2022-04-12 DIAGNOSIS — M25412 Effusion, left shoulder: Secondary | ICD-10-CM | POA: Diagnosis present

## 2022-04-12 DIAGNOSIS — E871 Hypo-osmolality and hyponatremia: Secondary | ICD-10-CM | POA: Diagnosis not present

## 2022-04-12 DIAGNOSIS — R000 Tachycardia, unspecified: Secondary | ICD-10-CM | POA: Diagnosis present

## 2022-04-12 DIAGNOSIS — R0682 Tachypnea, not elsewhere classified: Secondary | ICD-10-CM | POA: Diagnosis present

## 2022-04-12 DIAGNOSIS — Z992 Dependence on renal dialysis: Secondary | ICD-10-CM | POA: Diagnosis not present

## 2022-04-12 DIAGNOSIS — I160 Hypertensive urgency: Secondary | ICD-10-CM | POA: Diagnosis present

## 2022-04-12 DIAGNOSIS — I10 Essential (primary) hypertension: Secondary | ICD-10-CM

## 2022-04-12 DIAGNOSIS — T368X5A Adverse effect of other systemic antibiotics, initial encounter: Secondary | ICD-10-CM | POA: Diagnosis present

## 2022-04-12 DIAGNOSIS — E875 Hyperkalemia: Secondary | ICD-10-CM | POA: Diagnosis present

## 2022-04-12 DIAGNOSIS — D631 Anemia in chronic kidney disease: Secondary | ICD-10-CM | POA: Diagnosis present

## 2022-04-12 DIAGNOSIS — K219 Gastro-esophageal reflux disease without esophagitis: Secondary | ICD-10-CM | POA: Diagnosis present

## 2022-04-12 DIAGNOSIS — R778 Other specified abnormalities of plasma proteins: Secondary | ICD-10-CM

## 2022-04-12 DIAGNOSIS — R509 Fever, unspecified: Secondary | ICD-10-CM | POA: Diagnosis present

## 2022-04-12 DIAGNOSIS — I5022 Chronic systolic (congestive) heart failure: Secondary | ICD-10-CM | POA: Diagnosis present

## 2022-04-12 DIAGNOSIS — N2581 Secondary hyperparathyroidism of renal origin: Secondary | ICD-10-CM | POA: Diagnosis present

## 2022-04-12 DIAGNOSIS — F17210 Nicotine dependence, cigarettes, uncomplicated: Secondary | ICD-10-CM | POA: Diagnosis present

## 2022-04-12 DIAGNOSIS — R700 Elevated erythrocyte sedimentation rate: Secondary | ICD-10-CM | POA: Diagnosis present

## 2022-04-12 DIAGNOSIS — I132 Hypertensive heart and chronic kidney disease with heart failure and with stage 5 chronic kidney disease, or end stage renal disease: Secondary | ICD-10-CM | POA: Diagnosis present

## 2022-04-12 DIAGNOSIS — D72819 Decreased white blood cell count, unspecified: Secondary | ICD-10-CM | POA: Diagnosis present

## 2022-04-12 DIAGNOSIS — E876 Hypokalemia: Secondary | ICD-10-CM | POA: Diagnosis not present

## 2022-04-12 DIAGNOSIS — A419 Sepsis, unspecified organism: Secondary | ICD-10-CM

## 2022-04-12 DIAGNOSIS — M25512 Pain in left shoulder: Secondary | ICD-10-CM | POA: Diagnosis present

## 2022-04-12 DIAGNOSIS — I248 Other forms of acute ischemic heart disease: Secondary | ICD-10-CM | POA: Diagnosis present

## 2022-04-12 DIAGNOSIS — N186 End stage renal disease: Secondary | ICD-10-CM | POA: Diagnosis present

## 2022-04-12 DIAGNOSIS — F419 Anxiety disorder, unspecified: Secondary | ICD-10-CM | POA: Diagnosis present

## 2022-04-12 LAB — BASIC METABOLIC PANEL
Anion gap: 20 — ABNORMAL HIGH (ref 5–15)
BUN: 81 mg/dL — ABNORMAL HIGH (ref 6–20)
CO2: 22 mmol/L (ref 22–32)
Calcium: 9.1 mg/dL (ref 8.9–10.3)
Chloride: 91 mmol/L — ABNORMAL LOW (ref 98–111)
Creatinine, Ser: 12.77 mg/dL — ABNORMAL HIGH (ref 0.44–1.00)
GFR, Estimated: 3 mL/min — ABNORMAL LOW (ref >60)
Glucose, Bld: 87 mg/dL (ref 70–99)
Potassium: 5 mmol/L (ref 3.5–5.1)
Sodium: 133 mmol/L — ABNORMAL LOW (ref 135–145)

## 2022-04-12 LAB — CBC WITH DIFFERENTIAL/PLATELET
Abs Immature Granulocytes: 0.02 10*3/uL (ref 0.00–0.07)
Basophils Absolute: 0 10*3/uL (ref 0.0–0.1)
Basophils Relative: 0 %
Eosinophils Absolute: 0.1 10*3/uL (ref 0.0–0.5)
Eosinophils Relative: 1 %
HCT: 36.4 % (ref 36.0–46.0)
Hemoglobin: 11.3 g/dL — ABNORMAL LOW (ref 12.0–15.0)
Immature Granulocytes: 0 %
Lymphocytes Relative: 18 %
Lymphs Abs: 1 10*3/uL (ref 0.7–4.0)
MCH: 27.6 pg (ref 26.0–34.0)
MCHC: 31 g/dL (ref 30.0–36.0)
MCV: 88.8 fL (ref 80.0–100.0)
Monocytes Absolute: 1 10*3/uL (ref 0.1–1.0)
Monocytes Relative: 17 %
Neutro Abs: 3.5 10*3/uL (ref 1.7–7.7)
Neutrophils Relative %: 64 %
Platelets: 266 10*3/uL (ref 150–400)
RBC: 4.1 MIL/uL (ref 3.87–5.11)
RDW: 18.5 % — ABNORMAL HIGH (ref 11.5–15.5)
WBC: 5.6 10*3/uL (ref 4.0–10.5)
nRBC: 0 % (ref 0.0–0.2)

## 2022-04-12 LAB — LACTIC ACID, PLASMA
Lactic Acid, Venous: 1.7 mmol/L (ref 0.5–1.9)
Lactic Acid, Venous: 0.8 mmol/L (ref 0.5–1.9)

## 2022-04-12 LAB — TROPONIN I (HIGH SENSITIVITY)
Troponin I (High Sensitivity): 89 ng/L — ABNORMAL HIGH (ref <18)
Troponin I (High Sensitivity): 88 ng/L — ABNORMAL HIGH (ref <18)

## 2022-04-12 LAB — SEDIMENTATION RATE: Sed Rate: 121 mm/hr — ABNORMAL HIGH (ref 0–22)

## 2022-04-12 LAB — APTT: aPTT: 39 seconds — ABNORMAL HIGH (ref 24–36)

## 2022-04-12 LAB — PROTIME-INR
INR: 1.3 — ABNORMAL HIGH (ref 0.8–1.2)
Prothrombin Time: 16.1 seconds — ABNORMAL HIGH (ref 11.4–15.2)

## 2022-04-12 LAB — C-REACTIVE PROTEIN: CRP: 7.7 mg/dL — ABNORMAL HIGH (ref <1.0)

## 2022-04-12 MED ORDER — ACETAMINOPHEN 325 MG PO TABS
650.0000 mg | ORAL_TABLET | Freq: Once | ORAL | Status: AC
  Administered 2022-04-12: 650 mg via ORAL
  Filled 2022-04-12: qty 2

## 2022-04-12 MED ORDER — MORPHINE SULFATE (PF) 4 MG/ML IV SOLN
3.0000 mg | Freq: Once | INTRAVENOUS | Status: AC
  Administered 2022-04-12: 3 mg via INTRAVENOUS
  Filled 2022-04-12: qty 1

## 2022-04-12 MED ORDER — VANCOMYCIN HCL IN DEXTROSE 1-5 GM/200ML-% IV SOLN
1000.0000 mg | Freq: Once | INTRAVENOUS | Status: AC
  Administered 2022-04-12: 1000 mg via INTRAVENOUS
  Filled 2022-04-12: qty 200

## 2022-04-12 MED ORDER — VANCOMYCIN HCL 500 MG/100ML IV SOLN
500.0000 mg | INTRAVENOUS | Status: DC
  Administered 2022-04-14: 500 mg via INTRAVENOUS
  Filled 2022-04-12 (×3): qty 100

## 2022-04-12 MED ORDER — PIPERACILLIN-TAZOBACTAM IN DEX 2-0.25 GM/50ML IV SOLN
2.2500 g | Freq: Three times a day (TID) | INTRAVENOUS | Status: DC
  Administered 2022-04-13 – 2022-04-16 (×9): 2.25 g via INTRAVENOUS
  Filled 2022-04-12 (×13): qty 50

## 2022-04-12 MED ORDER — ONDANSETRON HCL 4 MG/2ML IJ SOLN
4.0000 mg | Freq: Once | INTRAMUSCULAR | Status: AC
Start: 2022-04-12 — End: 2022-04-12
  Administered 2022-04-12: 4 mg via INTRAVENOUS
  Filled 2022-04-12: qty 2

## 2022-04-12 MED ORDER — MORPHINE SULFATE (PF) 4 MG/ML IV SOLN
4.0000 mg | Freq: Once | INTRAVENOUS | Status: AC
  Administered 2022-04-12: 4 mg via INTRAVENOUS
  Filled 2022-04-12: qty 1

## 2022-04-12 MED ORDER — ACETAMINOPHEN 325 MG PO TABS
650.0000 mg | ORAL_TABLET | Freq: Four times a day (QID) | ORAL | Status: DC | PRN
Start: 2022-04-12 — End: 2022-04-17

## 2022-04-12 MED ORDER — OXYCODONE HCL 5 MG PO TABS: 5.0000 mg | ORAL_TABLET | ORAL | Status: DC | PRN

## 2022-04-12 NOTE — ED Provider Notes (Addendum)
?[facility] ?Provider Note ? ? ?CSN: [id number] ?Arrival date & time: 04/12/22  1358 ? ?  ? ?History ? ?Chief Complaint  ?Patient presents with  ? Arm Pain  ? ? ?[name] is a 41 y.o. female. ? ?41 year old female presents today for evaluation of left arm pain and inability to move secondary to pain since Friday.  She states initially on Friday her arm was stiff and since yesterday has been in significant pain.  She states she cannot go to her dialysis session due to pain.  She has a fistula in her left arm as well as her right arm.  Her left arm fistula has not been used for quite a while now.  She denies fever, chills, chest pain, shortness of breath beyond her baseline.  Denies any injuries to her arm.  She is not on anticoagulation.  She does state in the past that her left arm left arm fistula had drainage from it which was part of the reason why it was discontinued being used.  Denies drainage currently. ? ?The history is provided by the patient. No language interpreter was used.  ? ?  ? ?Home Medications ?Prior to Admission medications   ?Medication Sig Start Date End Date Taking? Authorizing Provider  ?acetaminophen (TYLENOL) 650 MG CR tablet Take 1,300 mg by mouth every 8 (eight) hours as needed for pain.    [provider]  ?calcium acetate (PHOSLO) 667 MG capsule Take 1,334-2,668 mg by mouth See admin instructions. Take 4 capsule (2668 mg) by mouth three times a day with each meal and 2 capsules (1334 mg) with each snack    [provider]  ?carvedilol (COREG) 12.5 MG tablet Take 12.5 mg by mouth 2 (two) times daily with a meal.  11/07/19   [provider]  ?diphenhydrAMINE (BENADRYL) 25 MG tablet Take 1 tablet (25 mg total) by mouth every 4 (four) hours as needed for itching or allergies. 01/23/21   Lavina Hamman, MD  ?doxercalciferol (HECTOROL) 4 MCG/2ML injection Use as recommended by nephrology 11/05/20   Hosie Poisson, MD   ?hydrOXYzine (ATARAX/VISTARIL) 10 MG tablet Take 1 tablet (10 mg total) by mouth 3 (three) times daily as needed for anxiety. ?Patient not taking: Reported on 12/15/2021 07/29/19   Caccavale, Sophia, PA-C  ?loperamide (IMODIUM A-D) 2 MG tablet Take 4 mg by mouth daily as needed for diarrhea or loose stools.     [provider]  ?multivitamin (RENA-VIT) TABS tablet Take 1 tablet by mouth daily.    [provider]  ?Nutritional Supplements (NOVASOURCE RENAL) LIQD Take 237 mLs by mouth See admin instructions. Take 1 container (237 mls) by at bedtime on dialysis days (Tuesday, Thursday, Saturday)    [provider]  ?ondansetron (ZOFRAN-ODT) 4 MG disintegrating tablet Take 1 tablet (4 mg total) by mouth every 8 (eight) hours as needed for nausea or vomiting. 12/18/21   Gifford Shave, MD  ?pantoprazole (PROTONIX) 40 MG tablet Take 1 tablet (40 mg total) by mouth 2 (two) times daily before a meal. 12/18/21 01/17/22  Gifford Shave, MD  ?   ? ?Allergies    ?Cephalosporins and Tobramycin sulfate   ? ?Review of Systems   ?Review of Systems  ?Constitutional:  Negative for chills and fever.  ?Respiratory:  Positive for shortness of breath (Dyspnea on exertion.  At baseline.).   ?Cardiovascular:  Negative for chest pain, palpitations and leg swelling.  ?Gastrointestinal:  Negative for abdominal pain, nausea  and vomiting.  ?Musculoskeletal:  Positive for arthralgias. Negative for joint swelling.  ?Neurological:  Negative for light-headedness.  ?All other systems reviewed and are negative. ? ?Physical Exam ?Updated Vital Signs ?BP (!) 208/66   Pulse (!) 110   Temp (!) 100.9 ?F (38.3 ?C) (Rectal)   Resp (!) 22   SpO2 96%  ?Physical Exam ?Vitals and nursing note reviewed.  ?Constitutional:   ?   General: She is not in acute distress. ?   Appearance: Normal appearance. She is not ill-appearing.  ?HENT:  ?   Head: Normocephalic and atraumatic.  ?   Nose: Nose normal.  ?Eyes:  ?   General: No scleral  icterus. ?   Extraocular Movements: Extraocular movements intact.  ?   Conjunctiva/sclera: Conjunctivae normal.  ?Cardiovascular:  ?   Rate and Rhythm: Regular rhythm. Tachycardia present.  ?   Pulses: Normal pulses.  ?   Heart sounds: Normal heart sounds.  ?Pulmonary:  ?   Effort: Pulmonary effort is normal. No respiratory distress.  ?   Breath sounds: Normal breath sounds. No wheezing or rales.  ?Abdominal:  ?   General: There is no distension.  ?   Tenderness: There is no abdominal tenderness.  ?Musculoskeletal:     ?   General: Normal range of motion.  ?   Cervical back: Normal range of motion.  ?   Right lower leg: No edema.  ?   Left lower leg: No edema.  ?   Comments: Bilateral upper extremities without visible deformity or swelling.  Left shoulder with tenderness to palpation.  Left upper extremity with significant pain with range of motion.  She is unable to tolerate much range of motion in the left upper extremity.  Radial pulse present.  She is able to move her left elbow and left wrist without much difficulty.  She is unable to actively move her left upper extremity however she cannot passively move her left elbow using her right arm.   ?Skin: ?   General: Skin is warm and dry.  ?Neurological:  ?   General: No focal deficit present.  ?   Mental Status: She is alert. Mental status is at baseline.  ? ? ?ED Results / Procedures / Treatments   ?Labs ?(all labs ordered are listed, but only abnormal results are displayed) ?Labs Reviewed  ?CBC WITH DIFFERENTIAL/PLATELET - Abnormal; Notable for the following components:  ?    Result Value  ? Hemoglobin 11.3 (*)   ? RDW 18.5 (*)   ? All other components within normal limits  ?BASIC METABOLIC PANEL - Abnormal; Notable for the following components:  ? Sodium 133 (*)   ? Chloride 91 (*)   ? BUN 81 (*)   ? Creatinine, Ser 12.77 (*)   ? GFR, Estimated 3 (*)   ? Anion gap 20 (*)   ? All other components within normal limits  ?TROPONIN I (HIGH SENSITIVITY) - Abnormal;  Notable for the following components:  ? Troponin I (High Sensitivity) 89 (*)   ? All other components within normal limits  ?CULTURE, BLOOD (ROUTINE X 2)  ?CULTURE, BLOOD (ROUTINE X 2)  ?SEDIMENTATION RATE  ?C-REACTIVE PROTEIN  ?LACTIC ACID, PLASMA  ?LACTIC ACID, PLASMA  ?TROPONIN I (HIGH SENSITIVITY)  ? ? ?EKG ?None ? ?Radiology ?DG Chest 2 View ? ?Result Date: 04/12/2022 ?CLINICAL DATA:  Pain left shoulder EXAM: CHEST - 2 VIEW COMPARISON:  11/28/2021 FINDINGS: Transverse diameter of heart is increased. There is blunting of both  lateral CP angles, more so on the left side. Central pulmonary vessels are prominent without signs of alveolar pulmonary edema. There is slight prominence of interstitial markings in both lungs with no significant interval change. There is no new focal pulmonary consolidation. There is no pneumothorax. IMPRESSION: Cardiomegaly. Small bilateral pleural effusions, more so on the left side. Central pulmonary vessels are prominent without signs of alveolar pulmonary edema. There is prominence of interstitial markings in both lungs which may suggest underlying scarring or mild interstitial edema or interstitial pneumonia. No new focal pulmonary consolidation is seen. Electronically Signed   By: Elmer Picker M.D.   On: 04/12/2022 16:36  ? ?DG Shoulder Left ? ?Result Date: 04/12/2022 ?CLINICAL DATA:  Pain left shoulder EXAM: LEFT SHOULDER - 2+ VIEW COMPARISON:  None. FINDINGS: No recent fracture or dislocation is seen. No abnormal soft tissue calcifications are noted adjacent to the proximal left humerus. There are coarse calcifications in the medial aspect of left upper arm which has not changed significantly in comparison with an earlier examination done on 01/20/2019. IMPRESSION: No radiographic abnormality is seen in the left shoulder. Electronically Signed   By: Elmer Picker M.D.   On: 04/12/2022 16:37   ? ?Procedures ?Procedures  ? ? ?Medications Ordered in ED ?Medications   ?morphine (PF) 4 MG/ML injection 3 mg (has no administration in time range)  ?ondansetron (ZOFRAN) injection 4 mg (has no administration in time range)  ? ? ?ED Course/ Medical Decision Making/ A&P ?  ?

## 2022-04-12 NOTE — Progress Notes (Signed)
Pharmacy Antibiotic Note ? ?[name] is a 41 y.o. female admitted on 04/12/2022 with sepsis.  Pharmacy has been consulted for zosyn and vancomycin dosing. ? ?Of note, patient has a h/o ESRD on [facility]ThS HD.  ? ?Plan: ?-Zosyn 2.25 gm IV Q 8 hours  ?-Vancomycin 1 gm IV load followed vancomycin 500 mg IV with each HD session ?-Monitor CBC, renal fx, cultures and clinical progress ?-Vanc levels as indicated  ? ?  ? ?Temp (24hrs), Avg:100.3 ?F (37.9 ?C), Min:99.6 ?F (37.6 ?C), Max:100.9 ?F (38.3 ?C) ? ?Recent Labs  ?Lab 04/12/22 ?1534 04/12/22 ?1824  ?WBC 5.6  --   ?CREATININE 12.77*  --   ?LATICACIDVEN  --  1.7  ?  ?CrCl cannot be calculated (Unknown ideal weight.).   ? ?Allergies  ?Allergen Reactions  ? Cephalosporins Rash  ?  To both keflex and cefazolin  ? Tobramycin Sulfate Swelling  ?  Eye swelling  ? ? ?Antimicrobials this admission: ?Zosyn 4/16 >>  ?Vancomycin 4/16 >>  ? ?Dose adjustments this admission: ? ? ?Microbiology results: ?4/16 BCx:  ? ?Thank you for allowing pharmacy to be a part of this patient?s care. ? ?Albertina Parr, PharmD., BCCCP ?Clinical Pharmacist ?Please refer to [facility] for unit-specific pharmacist  ? ? ?

## 2022-04-12 NOTE — Progress Notes (Signed)
VASCULAR LAB ? ? ? ?Left upper extremity venous duplex has been performed. ? ?See CV proc for preliminary results. ? ?Gave verbal results to [employer], PA-C  ? ?Kazandra Forstrom, RVT ?04/12/2022, 7:06 PM ? ?

## 2022-04-12 NOTE — ED Provider Triage Note (Signed)
Emergency Medicine Provider Triage Evaluation Note ? ?[name] , a 41 y.o. female  was evaluated in triage.  Pt complains of history of dialysis, currently uses right arm fistula.  Patient reports she has left arm fistula in place for several years but no longer using it.  Patient reports that 2 days ago she developed pain around her left armpit, pain has been constant and increases with movement of the left arm.  Patient reports she is unable to use her left arm due to pain.  She denies any numbness or tingling.  She denies any hand or elbow weakness.  She reports symptoms have been constant since onset. ? ?Review of Systems  ?Positive: Left arm pain ?Negative: Headache, vision change, difficulty speaking, neck pain, shortness of breath, abdominal pain, numbness/tingling, balance issues or any additional concerns. ? ?Physical Exam  ?BP (!) 174/135 (BP Location: Left Leg)   Pulse 98   Temp 99.6 ?F (37.6 ?C) (Oral)   Resp 18   SpO2 96%  ?Gen:   Awake, no distress   ?Resp:  Normal effort  ?MSK:   Moves extremities without difficulty.  Intact and equal radial pulses.  Capillary refill and sensation intact to bilateral upper extremities.  Strong equal grip strength bilaterally.  No evidence of trauma to the left arm, patient appears intact without evidence of infection.  Patient is tender to palpation at the left anterior deltoid extending down the left bicep. ? ? ?Medical Decision Making  ?Medically screening exam initiated at 3:17 PM.  Appropriate orders placed.  SHELLI PORTILLA was informed that the remainder of the evaluation will be completed by another provider, this initial triage assessment does not replace that evaluation, and the importance of remaining in the ED until their evaluation is complete. ? ? ? ?Note: Portions of this report may have been transcribed using voice recognition software. Every effort was made to ensure accuracy; however, inadvertent computerized transcription errors may still be  present. ? ?  ?Deliah Boston, PA-C ?04/12/22 1526 ? ?

## 2022-04-12 NOTE — H&P (Addendum)
?History and Physical  ? ? ?Patient: [name] JME:[id number] DOB: [birth year] ?DOA: 04/12/2022 ?DOS: the patient was seen and examined on 04/12/2022 ?PCP: Camillia Herter, NP  ?Patient coming from: Home ? ?Chief Complaint:  ?Chief Complaint  ?Patient presents with  ? Arm Pain  ? ?HPI: [name] is a 41 y.o. 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  Patient complained of having a stiff left arm on Friday (4/14) which subsequently resulted in pain since yesterday.  Patient denies any injury or fall to the left arm, pain is mainly in the axillary area.  She denies falling asleep in a chair with left arm hanging.  Patient denies fever, chills headache, blurry vision, chest pain,.  She was unable to go for dialysis yesterday (Saturday) due to pain. ? ?ED Course:  ?In the emergency department, she was febrile with a temperature of 100.77F, tachypneic, tachycardic, BP was 186/76, O2 sat was 96% on room air.  Work-up in the ED showed normocytic anemia, hyponatremia, BUN/creatinine 81/12.77, troponin x 2 - 89 > 88, CRP 7.7, sed rate 121, lactic acid 1.7 ?Left upper extremity ultrasound showed no DVT ?Left shoulder x-ray showed no radiographic abnormality ?Chest x-ray showed cardiomegaly. Small bilateral pleural effusions, more so on the left side. Central pulmonary vessels are prominent without signs of  alveolar pulmonary edema. There is prominence of interstitial markings in both lungs which may suggest underlying scarring or mild interstitial edema or interstitial pneumonia. No new focal pulmonary consolidation is seen. ? ?Review of Systems: ?Review of systems as noted in the HPI. All other systems reviewed and are negative. ? ? ?Past Medical History:  ?Diagnosis Date  ? Anemia   ? low iron - receives iron at dialysis  ? Anxiety    ? Arthritis   ? RA  ? Chronic systolic congestive heart failure ([place]) 03/16/2016  ? Dyspnea   ? ESRD (end stage renal disease) ([place])   ? Hemo [facility]AT _ [place]  ? H/O pericarditis 01/17/2013  ? H/O pleural effusion 01/17/2013  ? Heart murmur   ? Lupus (systemic lupus erythematosus) ([place])   ? Previously followed with Dr. Charlestine Night, has not followed up recently  ? 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 Renal biopsy shows segmental endocapillary proliferation and cellular crescent formation (Class IIIA) and lupus membranous glomerulopathy (Class V, stage II)  ? Pneumonia   ? many times  ? Polysubstance abuse ([place])   ? cocaine, MJ, tobacco  ? S/P pericardiocentesis 01/17/2013  ? H/o pericardial effusion with tamponade 2006   ? Seizures ([place])   ? during pregnancy 1 time  ? Streptococcal bacteremia 01/23/2013  ? She had two S. pneumonae bacteremia on 01/21/2013. Sensitive to Peniccilin   ? ?Past Surgical History:  ?Procedure Laterality Date  ? AV FISTULA PLACEMENT    ? AV FISTULA PLACEMENT Right 08/01/2020  ? Procedure: RIGHT ARM BRACHIOCEPHALIC  ARTERIOVENOUS (AV) FISTULA CREATION;  Surgeon: [name], MD;  Location: [facility];  Service: Vascular;  Laterality: Right;  ? BASCILIC VEIN TRANSPOSITION Left 02/05/2014  ? Procedure: BASCILIC VEIN TRANSPOSITION;  Surgeon: [name], MD;  Location: [facility];  Service: Vascular;  Laterality: Left;  ? BASCILIC VEIN TRANSPOSITION Right 01/08/2021  ? Procedure: RIGHT ARM SECOND STAGE [place];  Surgeon: Curt Jews  F, MD;  Location: [facility];  Service: Vascular;  Laterality: Right;  ? BIOPSY  12/17/2021  ? Procedure: BIOPSY;  Surgeon: Irving Copas., MD;  Location: [facility];  Service: Gastroenterology;;  ? ESOPHAGOGASTRODUODENOSCOPY (EGD) WITH PROPOFOL N/A 12/17/2021  ? Procedure: ESOPHAGOGASTRODUODENOSCOPY (EGD) WITH PROPOFOL;  Surgeon: Rush Landmark Telford Nab., MD;  Location: [facility];  Service: Gastroenterology;  Laterality: N/A;  ? FISTULA SUPERFICIALIZATION  Left 05/30/2018  ? Procedure: FISTULA PLICATION BASILIC VEIN TRANSPOSITION;  Surgeon: Angelia Mould, MD;  Location: [facility];  Service: Vascular;  Laterality: Left;  ? FISTULA SUPERFICIALIZATION Left 12/18/2019  ? Procedure: PLICATION OF LEFT ARTERIOVENOUS FISTULA ULCER;  Surgeon: [name], MD;  Location: [facility] OR;  Service: Vascular;  Laterality: Left;  ? I & D EXTREMITY Right 02/18/2021  ? Procedure: IRRIGATION AND DEBRIDEMENT OF ARM;  Surgeon: Angelia Mould, MD;  Location: [facility] OR;  Service: Vascular;  Laterality: Right;  ? INSERTION OF DIALYSIS CATHETER N/A 05/19/2020  ? Procedure: TUNNELED INSERTION  OF DIALYSIS CATHETER;  Surgeon: Waynetta Sandy, MD;  Location: [facility];  Service: Vascular;  Laterality: N/A;  ? THROMBECTOMY AND REVISION OF ARTERIOVENTOUS (AV) GORETEX  GRAFT Left 07/28/2020  ? Procedure: Oversewing of left arm Brachial cephalic fistula for bleeding.;  Surgeon: Angelia Mould, MD;  Location[facility] OR;  Service: Vascular;  Laterality: Left;  ? VENOGRAM Right 01/31/2014  ? Procedure: DIALYSIS CATHETER;  Surgeon: Serafina Mitchell, MD;  Location: [facility] CATH LAB;  Service: Cardiovascular;  Laterality: Right;  ? ? ?Social History:  reports that she has been smoking cigarettes. She has a 1.80 pack-year smoking history. She has never used smokeless tobacco. She reports current alcohol use. She reports current drug use. Drug: Marijuana. ? ? ?Allergies  ?Allergen Reactions  ? Cephalosporins Rash  ?  To both keflex and cefazolin  ? Tobramycin Sulfate Swelling  ?  Eye swelling  ? ? ?Family history: ?No significant family history ? ?Prior to Admission medications   ?Medication Sig Start Date End Date Taking? Authorizing Provider  ?acetaminophen (TYLENOL) 650 MG CR tablet Take 1,300 mg by mouth every 8 (eight) hours as needed for pain.    [provider]  ?calcium acetate (PHOSLO) 667 MG capsule Take 1,334-2,668 mg by mouth See admin instructions. Take 4 capsule (2668 mg) by mouth  three times a day with each meal and 2 capsules (1334 mg) with each snack    [provider]  ?carvedilol (COREG) 12.5 MG tablet Take 12.5 mg by mouth 2 (two) times daily with a meal.  11/07/19   [provider]  ?diphenhydrAMINE (BENADRYL) 25 MG tablet Take 1 tablet (25 mg total) by mouth every 4 (four) hours as needed for itching or allergies. 01/23/21   Lavina Hamman, MD  ?doxercalciferol (HECTOROL) 4 MCG/2ML injection Use as recommended by nephrology 11/05/20   Hosie Poisson, MD  ?hydrOXYzine (ATARAX/VISTARIL) 10 MG tablet Take 1 tablet (10 mg total) by mouth 3 (three) times daily as needed for anxiety. ?Patient not taking: Reported on 12/15/2021 07/29/19   Caccavale, Sophia, PA-C  ?loperamide (IMODIUM A-D) 2 MG tablet Take 4 mg by mouth daily as needed for diarrhea or loose stools.     [provider]  ?multivitamin (RENA-VIT) TABS tablet Take 1 tablet by mouth daily.    [provider]  ?Nutritional Supplements (NOVASOURCE RENAL) LIQD Take 237 mLs by mouth See admin instructions. Take 1 container (237 mls) by at bedtime on dialysis days (Tuesday, Thursday, Saturday)  [provider]  ?ondansetron (ZOFRAN-ODT) 4 MG disintegrating tablet Take 1 tablet (4 mg total) by mouth every 8 (eight) hours as needed for nausea or vomiting. 12/18/21   Gifford Shave, MD  ?pantoprazole (PROTONIX) 40 MG tablet Take 1 tablet (40 mg total) by mouth 2 (two) times daily before a meal. 12/18/21 01/17/22  Gifford Shave, MD  ? ? ?Physical Exam: ?BP (!) 190/47   Pulse (!) 114   Temp (!) 100.9 ?F (38.3 ?C) (Rectal)   Resp 18   SpO2 96%  ? ?General: 41 y.o. year-old female ill appearing, but in no acute distress.  Alert and oriented x3. ?HEENT: NCAT, EOMI ?Neck: Supple, trachea medial ?Cardiovascular: Tachycardia.  Regular rate and rhythm with no rubs or gallops.  No thyromegaly noted.  No lower extremity edema. 2/4 pulses in all 4 extremities. ?Respiratory: Tachypnea.  Clear to  auscultation with no wheezes or rales. Good inspiratory effort. ?Abdomen: Soft, nontender nondistended with normal bowel sounds x4 quadrants. ?Muskuloskeletal: Tender to palpation of left shoulder/axilla, decreased

## 2022-04-12 NOTE — ED Triage Notes (Addendum)
C/o L arm pain since Friday and unable to lift arm.  States she was unable to go to dialysis due to pain.  Last dialysis on Thursday.  L arm fistula is no longer used. ?

## 2022-04-13 ENCOUNTER — Encounter (HOSPITAL_COMMUNITY): Payer: Self-pay | Admitting: Internal Medicine

## 2022-04-13 ENCOUNTER — Inpatient Hospital Stay (HOSPITAL_COMMUNITY): Payer: Medicaid Other

## 2022-04-13 DIAGNOSIS — M25512 Pain in left shoulder: Secondary | ICD-10-CM

## 2022-04-13 DIAGNOSIS — R778 Other specified abnormalities of plasma proteins: Secondary | ICD-10-CM | POA: Diagnosis not present

## 2022-04-13 DIAGNOSIS — R509 Fever, unspecified: Secondary | ICD-10-CM | POA: Diagnosis not present

## 2022-04-13 DIAGNOSIS — N186 End stage renal disease: Secondary | ICD-10-CM | POA: Diagnosis not present

## 2022-04-13 LAB — PHOSPHORUS: Phosphorus: >30 mg/dL — ABNORMAL HIGH (ref 2.5–4.6)

## 2022-04-13 LAB — PROCALCITONIN: Procalcitonin: 1.64 ng/mL

## 2022-04-13 LAB — CBC
HCT: 32.2 % — ABNORMAL LOW (ref 36.0–46.0)
Hemoglobin: 10.1 g/dL — ABNORMAL LOW (ref 12.0–15.0)
MCH: 27.7 pg (ref 26.0–34.0)
MCHC: 31.4 g/dL (ref 30.0–36.0)
MCV: 88.2 fL (ref 80.0–100.0)
Platelets: 263 10*3/uL (ref 150–400)
RBC: 3.65 MIL/uL — ABNORMAL LOW (ref 3.87–5.11)
RDW: 17.9 % — ABNORMAL HIGH (ref 11.5–15.5)
WBC: 5.4 10*3/uL (ref 4.0–10.5)
nRBC: 0 % (ref 0.0–0.2)

## 2022-04-13 LAB — COMPREHENSIVE METABOLIC PANEL
ALT: 11 U/L (ref 0–44)
AST: 12 U/L — ABNORMAL LOW (ref 15–41)
Albumin: 2.5 g/dL — ABNORMAL LOW (ref 3.5–5.0)
Alkaline Phosphatase: 63 U/L (ref 38–126)
Anion gap: 21 — ABNORMAL HIGH (ref 5–15)
BUN: 94 mg/dL — ABNORMAL HIGH (ref 6–20)
CO2: 20 mmol/L — ABNORMAL LOW (ref 22–32)
Calcium: 9 mg/dL (ref 8.9–10.3)
Chloride: 91 mmol/L — ABNORMAL LOW (ref 98–111)
Creatinine, Ser: 14.15 mg/dL — ABNORMAL HIGH (ref 0.44–1.00)
GFR, Estimated: 3 mL/min — ABNORMAL LOW (ref >60)
Glucose, Bld: 80 mg/dL (ref 70–99)
Potassium: 6.2 mmol/L — ABNORMAL HIGH (ref 3.5–5.1)
Sodium: 132 mmol/L — ABNORMAL LOW (ref 135–145)
Total Bilirubin: 0.7 mg/dL (ref 0.3–1.2)
Total Protein: 8.4 g/dL — ABNORMAL HIGH (ref 6.5–8.1)

## 2022-04-13 LAB — MAGNESIUM: Magnesium: 2.5 mg/dL — ABNORMAL HIGH (ref 1.7–2.4)

## 2022-04-13 MED ORDER — CHLORHEXIDINE GLUCONATE CLOTH 2 % EX PADS
6.0000 | MEDICATED_PAD | Freq: Every day | CUTANEOUS | Status: DC
  Administered 2022-04-13 – 2022-04-17 (×4): 6 via TOPICAL

## 2022-04-13 MED ORDER — LIDOCAINE-PRILOCAINE 2.5-2.5 % EX CREA
1.0000 "application " | TOPICAL_CREAM | CUTANEOUS | Status: DC | PRN
  Filled 2022-04-13: qty 5

## 2022-04-13 MED ORDER — NEPRO/CARBSTEADY PO LIQD
237.0000 mL | ORAL | Status: DC
  Administered 2022-04-14 – 2022-04-16 (×2): 237 mL via ORAL
  Filled 2022-04-13: qty 237

## 2022-04-13 MED ORDER — LOPERAMIDE HCL 2 MG PO CAPS: 4.0000 mg | ORAL_CAPSULE | Freq: Every day | ORAL | Status: DC | PRN

## 2022-04-13 MED ORDER — SODIUM ZIRCONIUM CYCLOSILICATE 10 G PO PACK
10.0000 g | PACK | Freq: Three times a day (TID) | ORAL | Status: AC
  Administered 2022-04-13: 10 g via ORAL
  Filled 2022-04-13: qty 1

## 2022-04-13 MED ORDER — SEVELAMER CARBONATE 800 MG PO TABS
2400.0000 mg | ORAL_TABLET | Freq: Three times a day (TID) | ORAL | Status: DC
  Administered 2022-04-13 – 2022-04-17 (×10): 2400 mg via ORAL
  Filled 2022-04-13 (×11): qty 3

## 2022-04-13 MED ORDER — GADOBUTROL 1 MMOL/ML IV SOLN
4.5000 mL | Freq: Once | INTRAVENOUS | Status: AC | PRN
  Administered 2022-04-13: 4.5 mL via INTRAVENOUS

## 2022-04-13 MED ORDER — HYDRALAZINE HCL 20 MG/ML IJ SOLN
10.0000 mg | Freq: Four times a day (QID) | INTRAMUSCULAR | Status: DC | PRN
Start: 2022-04-13 — End: 2022-04-17

## 2022-04-13 MED ORDER — PENTAFLUOROPROP-TETRAFLUOROETH EX AERO
1.0000 "application " | INHALATION_SPRAY | CUTANEOUS | Status: DC | PRN
  Filled 2022-04-13: qty 30

## 2022-04-13 MED ORDER — SODIUM CHLORIDE 0.9 % IV SOLN: 100.0000 mL | INTRAVENOUS | Status: DC | PRN

## 2022-04-13 MED ORDER — CARVEDILOL 12.5 MG PO TABS
12.5000 mg | ORAL_TABLET | Freq: Every day | ORAL | Status: DC
Start: 2022-04-13 — End: 2022-04-17
  Administered 2022-04-13 – 2022-04-17 (×4): 12.5 mg via ORAL
  Filled 2022-04-13 (×4): qty 1

## 2022-04-13 MED ORDER — DARBEPOETIN ALFA 40 MCG/0.4ML IJ SOSY
40.0000 ug | PREFILLED_SYRINGE | INTRAMUSCULAR | Status: DC
  Administered 2022-04-13: 40 ug via INTRAVENOUS
  Filled 2022-04-13: qty 0.4

## 2022-04-13 MED ORDER — HEPARIN SODIUM (PORCINE) 5000 UNIT/ML IJ SOLN
5000.0000 [IU] | Freq: Three times a day (TID) | INTRAMUSCULAR | Status: DC
  Administered 2022-04-13 – 2022-04-16 (×10): 5000 [IU] via SUBCUTANEOUS
  Filled 2022-04-13 (×12): qty 1

## 2022-04-13 MED ORDER — MORPHINE SULFATE (PF) 2 MG/ML IV SOLN
2.0000 mg | INTRAVENOUS | Status: DC | PRN
  Administered 2022-04-13 – 2022-04-14 (×8): 2 mg via INTRAVENOUS
  Filled 2022-04-13 (×8): qty 1

## 2022-04-13 MED ORDER — LIDOCAINE HCL (PF) 1 % IJ SOLN: 5.0000 mL | INTRAMUSCULAR | Status: DC | PRN

## 2022-04-13 MED ORDER — DIPHENHYDRAMINE HCL 25 MG PO CAPS
25.0000 mg | ORAL_CAPSULE | ORAL | Status: DC | PRN
  Administered 2022-04-13: 25 mg via ORAL
  Filled 2022-04-13: qty 1

## 2022-04-13 NOTE — ED Notes (Signed)
Pt  has returned from mri ?

## 2022-04-13 NOTE — Consult Note (Signed)
? ?ORTHOPAEDIC CONSULTATION ? ?REQUESTING PHYSICIAN: Bernadette Hoit, DO ? ?PCP:  Camillia Herter, NP ? ?Chief Complaint: Left shoulder pain  ? ?HPI: ?[name] is a 41 y.o. female with a past medical history of ESRD on HD, lupus nephritis, HFrEF (LVEF 30 to 35%-11/03/2020) presenting to the emergency room for cute onset of left arm pain that began on 04/10/2022 with no significant injury.  Patient progressive onset of soreness in the left shoulder with progressive worsening of pain, limited motion secondary to pain.  She denies having fever or chills.  She was able to go to dialysis secondary to pain.  She reports pain with any motion of the shoulder.  She reports she is able to move her fingers and elbow.  She denies any significant numbness and tingling does feel like she cannot control the shoulder well.   ? ?Denies previous issues of the left shoulder.  Does have a history of the left arm fistula and has had some issues, is currently using a right arm fistula.  She does have a history of a right fistula abscess on 02/18/2021 which required incision and drainage. ? ?Past Medical History:  ?Diagnosis Date  ? Anemia   ? low iron - receives iron at dialysis  ? Anxiety   ? Arthritis   ? RA  ? Chronic systolic congestive heart failure ([place]) 03/16/2016  ? Dyspnea   ? ESRD (end stage renal disease) ([place])   ? Hemo TTHSAT _ [place]  ? H/O pericarditis 01/17/2013  ? H/O pleural effusion 01/17/2013  ? Heart murmur   ? Lupus (systemic lupus erythematosus) ([place])   ? Previously followed with Dr. Charlestine Night, has not followed up recently  ? 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 Renal biopsy shows segmental endocapillary proliferation and cellular crescent formation (Class IIIA) and lupus membranous glomerulopathy (Class V, stage II)  ? Pneumonia   ? many times  ? Polysubstance abuse ([place])   ? cocaine, MJ, tobacco  ? S/P pericardiocentesis 01/17/2013  ? H/o pericardial effusion with tamponade 2006   ? Seizures ([place])   ? during  pregnancy 1 time  ? Streptococcal bacteremia 01/23/2013  ? She had two S. pneumonae bacteremia on 01/21/2013. Sensitive to Peniccilin   ? ?Past Surgical History:  ?Procedure Laterality Date  ? AV FISTULA PLACEMENT    ? AV FISTULA PLACEMENT Right 08/01/2020  ? Procedure: RIGHT ARM BRACHIOCEPHALIC  ARTERIOVENOUS (AV) FISTULA CREATION;  Surgeon: [name], MD;  Location: [place];  Service: Vascular;  Laterality: Right;  ? BASCILIC VEIN TRANSPOSITION Left 02/05/2014  ? Procedure: BASCILIC VEIN TRANSPOSITION;  Surgeon: [name], MD;  Location: [place];  Service: Vascular;  Laterality: Left;  ? BASCILIC VEIN TRANSPOSITION Right 01/08/2021  ? Procedure: RIGHT ARM SECOND STAGE BASCILIC VEIN TRANSPOSITION;  Surgeon: [name], MD;  Location: [place];  Service: Vascular;  Laterality: Right;  ? BIOPSY  12/17/2021  ? Procedure: BIOPSY;  Surgeon: Irving Copas., MD;  Location: [place];  Service: Gastroenterology;;  ? ESOPHAGOGASTRODUODENOSCOPY (EGD) WITH PROPOFOL N/A 12/17/2021  ? Procedure: ESOPHAGOGASTRODUODENOSCOPY (EGD) WITH PROPOFOL;  Surgeon: Rush Landmark Telford Nab., MD;  Location: [place];  Service: Gastroenterology;  Laterality: N/A;  ? FISTULA SUPERFICIALIZATION Left 05/30/2018  ? Procedure: FISTULA PLICATION BASILIC VEIN TRANSPOSITION;  Surgeon: Angelia Mould, MD;  Location: [place];  Service: Vascular;  Laterality: Left;  ? FISTULA SUPERFICIALIZATION Left 12/18/2019  ? Procedure: PLICATION OF LEFT ARTERIOVENOUS FISTULA ULCER;  Surgeon: [name], MD;  [facility];  Service: Vascular;  Laterality: Left;  ? I & D EXTREMITY Right 02/18/2021  ? Procedure: IRRIGATION AND DEBRIDEMENT OF ARM;  Surgeon: Angelia Mould, MD;  Location: [facility] OR;  Service: Vascular;  Laterality: Right;  ? INSERTION OF DIALYSIS CATHETER N/A 05/19/2020  ? Procedure: TUNNELED INSERTION  OF DIALYSIS CATHETER;  Surgeon: Waynetta Sandy, MD;  Location: [place];  Service: Vascular;  Laterality: N/A;  ?  THROMBECTOMY AND REVISION OF ARTERIOVENTOUS (AV) GORETEX  GRAFT Left 07/28/2020  ? Procedure: Oversewing of left arm Brachial cephalic fistula for bleeding.;  Surgeon: Angelia Mould, MD;  Location: [facility] OR;  Service: Vascular;  Laterality: Left;  ? VENOGRAM Right 01/31/2014  ? Procedure: DIALYSIS CATHETER;  Surgeon: Serafina Mitchell, MD;  Location: [facility] CATH LAB;  Service: Cardiovascular;  Laterality: Right;  ? ?Social History  ? ?Socioeconomic History  ? Marital status: Single  ?  Spouse name: Not on file  ? Number of children: Not on file  ? Years of education: Not on file  ? Highest education level: Not on file  ?Occupational History  ? Not on file  ?Tobacco Use  ? Smoking status: Every Day  ?  Packs/day: 0.12  ?  Years: 15.00  ?  Pack years: 1.80  ?  Types: Cigarettes  ? Smokeless tobacco: Never  ? Tobacco comments:  ?  4 cigarettes per day  ?Vaping Use  ? Vaping Use: Never used  ?Substance and Sexual Activity  ? Alcohol use: Yes  ?  Alcohol/week: 0.0 standard drinks  ?  Comment: Special Occasional takes Vicar  ? Drug use: Yes  ?  Types: Marijuana  ?  Comment: ocassional, last time- 12/21/20  ? Sexual activity: Not Currently  ?  Birth control/protection: None  ?Other Topics Concern  ? Not on file  ?Social History Narrative  ? Not on file  ? ?Social Determinants of Health  ? ?Financial Resource Strain: Not on file  ?Food Insecurity: Not on file  ?Transportation Needs: Not on file  ?Physical Activity: Not on file  ?Stress: Not on file  ?Social Connections: Not on file  ? ?No family history on file. ?Allergies  ?Allergen Reactions  ? Cephalosporins Rash  ?  To both keflex and cefazolin  ? Tobramycin Sulfate Swelling  ?  Eye swelling  ? ?Prior to Admission medications   ?Medication Sig Start Date End Date Taking? Authorizing Provider  ?acetaminophen (TYLENOL) 650 MG CR tablet Take 1,300 mg by mouth every 8 (eight) hours as needed for pain.   Yes [provider]  ?carvedilol (COREG) 12.5 MG tablet Take 12.5  mg by mouth daily. 11/07/19  Yes [provider]  ?diphenhydrAMINE (BENADRYL) 25 MG tablet Take 1 tablet (25 mg total) by mouth every 4 (four) hours as needed for itching or allergies. ?Patient taking differently: Take 25 mg by mouth 2 (two) times daily. 01/23/21  Yes Lavina Hamman, MD  ?doxercalciferol (HECTOROL) 4 MCG/2ML injection Use as recommended by nephrology ?Patient taking differently: 4 mcg Every Tuesday,Thursday,and Saturday with dialysis. Use as recommended by nephrology 11/05/20  Yes Hosie Poisson, MD  ?loperamide (IMODIUM A-D) 2 MG tablet Take 4 mg by mouth daily as needed for diarrhea or loose stools. Dialysis days   Yes [provider]  ?multivitamin (RENA-VIT) TABS tablet Take 1 tablet by mouth daily.   Yes [provider]  ?Nutritional Supplements (NOVASOURCE RENAL) LIQD Take 237 mLs by mouth See admin instructions. Take 1 container (237  mls) by at bedtime on dialysis days (Tuesday, Thursday, Saturday)   Yes [provider]  ?ondansetron (ZOFRAN-ODT) 4 MG disintegrating tablet Take 1 tablet (4 mg total) by mouth every 8 (eight) hours as needed for nausea or vomiting. 12/18/21  Yes Gifford Shave, MD  ?sevelamer carbonate (RENVELA) 800 MG tablet Take 2,400 mg by mouth 3 (three) times daily. 02/10/22  Yes [provider]  ?hydrOXYzine (ATARAX/VISTARIL) 10 MG tablet Take 1 tablet (10 mg total) by mouth 3 (three) times daily as needed for anxiety. ?Patient not taking: Reported on 12/15/2021 07/29/19   Caccavale, Sophia, PA-C  ?pantoprazole (PROTONIX) 40 MG tablet Take 1 tablet (40 mg total) by mouth 2 (two) times daily before a meal. ?Patient not taking: Reported on 04/12/2022 12/18/21 01/17/22  Gifford Shave, MD  ? ?DG Chest 2 View ? ?Result Date: 04/12/2022 ?CLINICAL DATA:  Pain left shoulder EXAM: CHEST - 2 VIEW COMPARISON:  11/28/2021 FINDINGS: Transverse diameter of heart is increased. There is blunting of both lateral CP angles, more so on the left  side. Central pulmonary vessels are prominent without signs of alveolar pulmonary edema. There is slight prominence of interstitial markings in both lungs with no significant interval change. There is no new focal pu

## 2022-04-13 NOTE — Procedures (Signed)
? ?  I was present at this dialysis session, have reviewed the session itself and made  appropriate changes ?Kelly Splinter MD ?[employer] ?pager [phone]   ?04/13/2022, 12:42 PM ? ? ?

## 2022-04-13 NOTE — ED Notes (Signed)
Report given to Geraldine Solar, RN ?

## 2022-04-13 NOTE — Hospital Course (Addendum)
Patient is a 41 years old female with past medical history of end-stage renal disease on hemodialysis, Tuesday Thursday Saturday, left breast Referral heart failure with reduced ejection fraction EF of 30 to 35% presented hospital with left arm pain difficulty moving due to pain.  In the ED, patient was noted to be febrile with a temperature of 100.9 ?F and tachypnea tachycardia.  Labs showed hyponatremia with elevated creatinine at 12.7 and mildly elevated troponins.  CRP of 7.7, ESR of 121 and lactate was 1.7.  Left upper extremity ultrasound showed no evidence of DVT.  X-ray of the shoulder without any acute abnormality.  Chest x-ray showed cardiomegaly and small pleural effusions.  Patient was then admitted hospital for further evaluation and treatment. ? ?Assessment and plan ? ?Principal Problem: ?  Acute febrile illness ?Active Problems: ?  Essential hypertension ?  ESRD on dialysis [facility]) ?  Left shoulder pain ?  Hypertensive urgency ?  GERD (gastroesophageal reflux disease) ?  HFrEF (heart failure with reduced ejection fraction) ([place]) ?  Elevated troponin ?  SIRS (systemic inflammatory response syndrome) ([facility]) ?  ?SIRS  ?Unclear etiology at this time.  Patient did have a left shoulder joint effusion which was aspirated but no obvious signs of infection at this time.  Blood cultures negative in 5 days.  Patient was initially empirically on vancomycin and Zosyn.  Antibiotics were subsequently discontinued since Gram stain was negative and culture was negative from the joint fluid.   ? ?Left shoulder pain with joint effusion.  Initial concern for septic arthritis.  Septic arthritis has been ruled out at this time.  Patient did have a joint effusion which improved after aspiration.  Range of movement and pain has significantly improved at this time.  MRI of the left shoulder showed prominent soft tissue swelling with large glenohumeral joint effusion concerning for septic arthritis.  Status post fluoroscopic  guided joint aspiration of the left shoulder.  Patient was initially on antibiotic which has been discontinued.  Patient continues to improve clinically and is afebrile at this time.  No leukocytosis.  Patient was advised to follow-up with rheumatologist as outpatient since this could be manifestation of lupus flare.. ? ?Hypokalemia. ?Replenished with potassium prior to discharge.  Could be adjusted with hemodialysis if necessary. ? ?Hypertensive urgency- ?On presentation.  Continue  Coreg with holding parameters.  Blood pressure is controlled at this time. ? ?Elevated troponin possibly secondary to type II demand ischemia. Troponin 89 > 88;  flat troponins.  No chest pain.  No further work-up was pursued. ? ?GERD ?Continue Protonix ? ?History of HFrEF ?Continue Coreg.  Volume managed by hemodialysis. ? ?Anemia of chronic kidney disease/chronic disease.   ?Patient is on weekly Aranesp.  Nephrology to follow as outpatient. ? ?ESRD on HD ([facility]) ?Received hemodialysis during hospitalization.  We will continue on discharge ? ?History of systemic lupus erythematosus.  Not on any maintenance medication at this time.  States that she was in remission but has not had a rheumatologist as outpatient.  She had followed up with PCP.  I have encouraged her to follow-up with her rheumatologist as outpatient.  CRP and sed rate was elevated on presentation. ?

## 2022-04-13 NOTE — ED Notes (Signed)
Patient transported to MRI 

## 2022-04-13 NOTE — ED Notes (Signed)
Pt given [place] sandwich and cup of ice water. ?

## 2022-04-13 NOTE — Progress Notes (Signed)
[facility] ?Renal Consultation Note  ?  ?Indication for Consultation:  Management of ESRD/hemodialysis, anemia, hypertension/volume, and secondary hyperparathyroidism. ?PCP: ? ?HPI: [name] is a 41 y.o. female with ESRD, SLE, HTN, HFrEF (last echo 10/2020 with EF 30-35%) who was admitted with L shoulder joint effusion, concerning for septic arthritis. ? ?She reports symptoms started last Fri with just stiffness, then progressed over the weekend the joint that she couldn't move the L arm. Presented to the ED on 4/16 evening. No recent injury. She denied any recent infection. No recent chest pain, dyspnea, N/V/D. ? ?In the ED, she was noted to have low grade fever. Labs showed Na 133, K 5, BUN 81, Ca 9.1, Trop 89 -> 88, CRP 7.7, WBC 5.6, Hgb 11.3, LA 1.7, ESR 121. She underwent L shoulder MRI this morning, concern for septic arthritis. Blood Cx collected and she was started on Vancomycin and Zosyn. She reports having some itching associated with the Vancomycin and was given a benadryl with some improvement. Ortho and IR have been consulted. ? ?She was evaluated in the HD unit prior to her treatment today. Denies CP, dyspnea, abdominal pain, N/V/D, or dysuria. She has no edema. L shoulder remains painful. ? ?Dialyzes on TTS schedule at [facility]. Uses RUE AVF and no recent issues. ? ?Past Medical History:  ?Diagnosis Date  ? Anemia   ? low iron - receives iron at dialysis  ? Anxiety   ? Arthritis   ? RA  ? Chronic systolic congestive heart failure ([place]) 03/16/2016  ? Dyspnea   ? ESRD (end stage renal disease) ([place])   ? Hemo TTHSAT _ [place]  ? H/O pericarditis 01/17/2013  ? H/O pleural effusion 01/17/2013  ? Heart murmur   ? Lupus (systemic lupus erythematosus) ([place])   ? Previously followed with Dr. Charlestine Night, has not followed up recently  ? 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 Renal biopsy shows segmental endocapillary proliferation and cellular crescent formation (Class IIIA) and lupus  membranous glomerulopathy (Class V, stage II)  ? Pneumonia   ? many times  ? Polysubstance abuse ([place])   ? cocaine, MJ, tobacco  ? S/P pericardiocentesis 01/17/2013  ? H/o pericardial effusion with tamponade 2006   ? Seizures ([place])   ? during pregnancy 1 time  ? Streptococcal bacteremia 01/23/2013  ? She had two S. pneumonae bacteremia on 01/21/2013. Sensitive to Peniccilin   ? ?Past Surgical History:  ?Procedure Laterality Date  ? AV FISTULA PLACEMENT    ? AV FISTULA PLACEMENT Right 08/01/2020  ? Procedure: RIGHT ARM BRACHIOCEPHALIC  ARTERIOVENOUS (AV) FISTULA CREATION;  Surgeon: [name], MD;  Location: [place];  Service: Vascular;  Laterality: Right;  ? BASCILIC VEIN TRANSPOSITION Left 02/05/2014  ? Procedure: BASCILIC VEIN TRANSPOSITION;  Surgeon: [name], MD;  Location: [place];  Service: Vascular;  Laterality: Left;  ? BASCILIC VEIN TRANSPOSITION Right 01/08/2021  ? Procedure: RIGHT ARM SECOND STAGE BASCILIC VEIN TRANSPOSITION;  Surgeon: [name], MD;  Location: [place];  Service: Vascular;  Laterality: Right;  ? BIOPSY  12/17/2021  ? Procedure: BIOPSY;  Surgeon: Irving Copas., MD;  Location: [place];  Service: Gastroenterology;;  ? ESOPHAGOGASTRODUODENOSCOPY (EGD) WITH PROPOFOL N/A 12/17/2021  ? Procedure: ESOPHAGOGASTRODUODENOSCOPY (EGD) WITH PROPOFOL;  Surgeon: Rush Landmark Telford Nab., MD;  Location: [place];  Service: Gastroenterology;  Laterality: N/A;  ? FISTULA SUPERFICIALIZATION Left 05/30/2018  ? Procedure: FISTULA PLICATION BASILIC VEIN TRANSPOSITION;  Surgeon: Angelia Mould, MD;  [facility];  Service: Vascular;  Laterality: Left;  ? FISTULA SUPERFICIALIZATION Left 12/18/2019  ? Procedure: PLICATION OF LEFT ARTERIOVENOUS FISTULA ULCER;  Surgeon: [name], MD;  Location: [facility] OR;  Service: Vascular;  Laterality: Left;  ? I & D EXTREMITY Right 02/18/2021  ? Procedure: IRRIGATION AND DEBRIDEMENT OF ARM;  Surgeon: Angelia Mould, MD;  Location: [facility] OR;  Service:  Vascular;  Laterality: Right;  ? INSERTION OF DIALYSIS CATHETER N/A 05/19/2020  ? Procedure: TUNNELED INSERTION  OF DIALYSIS CATHETER;  Surgeon: Waynetta Sandy, MD;  Location: [place];  Service: Vascular;  Laterality: N/A;  ? THROMBECTOMY AND REVISION OF ARTERIOVENTOUS (AV) GORETEX  GRAFT Left 07/28/2020  ? Procedure: Oversewing of left arm Brachial cephalic fistula for bleeding.;  Surgeon: Angelia Mould, MD;  Location: [facility] OR;  Service: Vascular;  Laterality: Left;  ? VENOGRAM Right 01/31/2014  ? Procedure: DIALYSIS CATHETER;  Surgeon: Serafina Mitchell, MD;  Location: [facility] CATH LAB;  Service: Cardiovascular;  Laterality: Right;  ? ?No family history on file. ?Social History: ? reports that she has been smoking cigarettes. She has a 1.80 pack-year smoking history. She has never used smokeless tobacco. She reports current alcohol use. She reports current drug use. Drug: Marijuana. ? ?ROS: As per HPI otherwise negative. ? ?Physical Exam: ?Vitals:  ? 04/13/22 1000 04/13/22 1130 04/13/22 1213 04/13/22 1218  ?BP: (!) 161/68 (!) 143/73 (!) 171/38 138/83  ?Pulse: 91 88 96 90  ?Resp: (!) 23 17 (!) 28 (!) 21  ?Temp:   99.6 ?F (37.6 ?C)   ?TempSrc:   Axillary   ?SpO2: 98% 95%    ?Weight:   47.6 kg   ?   ?General: Well developed, well nourished, in no acute distress. Room air. ?Head: Normocephalic, atraumatic, sclera non-icteric, mucus membranes are moist. ?Neck: Supple without lymphadenopathy/masses. JVD not elevated. ?Lungs: Clear bilaterally to auscultation without wheezes, rales, or rhonchi. Breathing is unlabored. ?Heart: RRR with normal S1, S2. No murmurs, rubs, or gallops appreciated. ?Abdomen: Soft, non-tender, non-distended with normoactive bowel sounds. No rebound/guarding. No obvious abdominal masses. ?Musculoskeletal:  Strength and tone appear normal for age in LE. L arm hanging at her side. ?Lower extremities: No edema or ischemic changes, no open wounds. ?Neuro: Alert and oriented X 3. Moves all  extremities spontaneously. ?Psych:  Responds to questions appropriately with a normal affect. ?Dialysis Access: RUE AVF + bruit ? ?Allergies  ?Allergen Reactions  ? Cephalosporins Rash  ?  To both keflex and cefazolin  ? Tobramycin Sulfate Swelling  ?  Eye swelling  ? ?Prior to Admission medications   ?Medication Sig Start Date End Date Taking? Authorizing Provider  ?acetaminophen (TYLENOL) 650 MG CR tablet Take 1,300 mg by mouth every 8 (eight) hours as needed for pain.   Yes [provider]  ?carvedilol (COREG) 12.5 MG tablet Take 12.5 mg by mouth daily. 11/07/19  Yes [provider]  ?diphenhydrAMINE (BENADRYL) 25 MG tablet Take 1 tablet (25 mg total) by mouth every 4 (four) hours as needed for itching or allergies. ?Patient taking differently: Take 25 mg by mouth 2 (two) times daily. 01/23/21  Yes Lavina Hamman, MD  ?doxercalciferol (HECTOROL) 4 MCG/2ML injection Use as recommended by nephrology ?Patient taking differently: 4 mcg Every Tuesday,Thursday,and Saturday with dialysis. Use as recommended by nephrology 11/05/20  Yes Hosie Poisson, MD  ?loperamide (IMODIUM A-D) 2 MG tablet Take 4 mg by mouth daily as needed for diarrhea or loose stools. Dialysis days   Yes  [provider]  ?multivitamin (RENA-VIT) TABS tablet Take 1 tablet by mouth daily.   Yes [provider]  ?Nutritional Supplements (NOVASOURCE RENAL) LIQD Take 237 mLs by mouth See admin instructions. Take 1 container (237 mls) by at bedtime on dialysis days (Tuesday, Thursday, Saturday)   Yes [provider]  ?ondansetron (ZOFRAN-ODT) 4 MG disintegrating tablet Take 1 tablet (4 mg total) by mouth every 8 (eight) hours as needed for nausea or vomiting. 12/18/21  Yes Gifford Shave, MD  ?sevelamer carbonate (RENVELA) 800 MG tablet Take 2,400 mg by mouth 3 (three) times daily. 02/10/22  Yes [provider]  ?hydrOXYzine (ATARAX/VISTARIL) 10 MG tablet Take 1 tablet (10 mg total) by mouth 3 (three)  times daily as needed for anxiety. ?Patient not taking: Reported on 12/15/2021 07/29/19   Caccavale, Sophia, PA-C  ?pantoprazole (PROTONIX) 40 MG tablet Take 1 tablet (40 mg total) by mouth 2 (two) times daily before

## 2022-04-13 NOTE — Progress Notes (Signed)
?PROGRESS NOTE ? ? ? ?[name]  UXN:[id number] DOB: Apr 22, 1981 DOA: 04/12/2022 ?PCP: Camillia Herter, NP  ? ? ?Brief Narrative:  ?  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  In the ED, patient was noted to be febrile with a temperature of 100.9 ?F and tachypnea tachycardia.  Labs showed hyponatremia with elevated creatinine at 12.7 and mildly elevated troponins.  CRP of 7.7, ESR of 121 and lactate was 1.7.  Left upper extremity ultrasound showed no evidence of DVT.  X-ray of the shoulder without any acute abnormality.  Chest x-ray showed cardiomegaly and small pleural effusions. ? ?Assessment and plan ? ?SIRS possibly secondary to acute febrile illness  ?Unclear etiology at this time.  Possible lupus flare.  Follow blood cultures empirically on vancomycin and Zosyn.  CRP and ESR was significantly elevated.  Continue Tylenol.  Orthopedics was consulted from the ED who recommended MRI of the left shoulder.  Plan to consult orthopedic if effusion.  Lactate was 0.8 on repeat..  No leukocytosis today.  Temperature max of 100.9 Fahrenheit. ? ?Hyperkalemia. ?Potassium of 6.2 today.  Will need hemodialysis.  We will give Lokelma x1.  Nephrology has been notified.  Plan for hemodialysis today then usual regimen. ? ?Left shoulder pain with decreased range of movement. ?Continue supportive care.  Check MRI of the shoulder.  Orthopedics on board and recommend IR guided patient of the joint to rule out septic arthritis. ? ?Hypertensive urgency- ?Resume home medications.  On Coreg.  Add hydralazine.  We will continue to monitor. ? ?Elevated troponin possibly secondary to type II demand ischemia ?Troponin 89 > 88;  flat troponins.  No chest pain. ? ?GERD ?Continue Protonix ? ?History of  HFrEF ?Continue Coreg ? ?Anemia of chronic kidney disease/chronic disease.  Patient is on weekly Aranesp.  Nephrology managing. ? ?ESRD on HD (TThS) ?Had missed 1 dialysis session.  Nephrology has been consulted for further hemodialysis needs. ? ?History of systemic lupus erythematosus.  Not on any maintenance medication at this time.  ? ? ? DVT prophylaxis: heparin injection 5,000 Units Start: 04/13/22 0730 ?SCDs Start: 04/13/22 0724 ? ? ?Code Status:   ?  Code Status: Full Code ? ?Disposition: Home ?Status is: Inpatient ? ?Remains inpatient appropriate because: IV antibiotic,  possible shoulder infection ? ? Family Communication: Spoke with the patient at bedside. ? ?Consultants:  ?Orthopedics ?Nephrology ?Interventional radiology ? ?Procedures:  ?Hemodialysis ? ?Antimicrobials:  ?Vancomycin and Zosyn ? ?Anti-infectives (From admission, onward)  ? ? Start     Dose/Rate Route Frequency Ordered Stop  ? 04/14/22 1200  vancomycin (VANCOREADY) IVPB 500 mg/100 mL       ? 500 mg ?100 mL/hr over 60 Minutes Intravenous Every T-Th-Sa (Hemodialysis) 04/12/22 2131    ? 04/12/22 2200  piperacillin-tazobactam (ZOSYN) IVPB 2.25 g       ? 2.25 g ?100 mL/hr over 30 Minutes Intravenous Every 8 hours 04/12/22 2137    ? 04/12/22 2145  vancomycin (VANCOCIN) IVPB 1000 mg/200 mL premix       ? 1,000 mg ?200 mL/hr over 60 Minutes Intravenous  Once 04/12/22 2131 04/12/22 2315  ? ?  ? ? ? ?Subjective: ?Today, patient was seen and examined at bedside.  Complains of left shoulder pain and stiffness.  No  nausea vomiting shortness of breath or chest pain.  Temperature was 100.9 ?F. ? ?Objective: ?Vitals:  ? 04/13/22 1213 04/13/22 1218 04/13/22 1300 04/13/22 1330  ?BP: (!) 171/38 138/83 (!) 164/63 (!) 165/108  ?Pulse: 96 90 90 92  ?Resp: (!) 28 (!) 21 (!) 21 (!) 24  ?Temp: 99.6 ?F (37.6 ?C)     ?TempSrc: Axillary     ?SpO2:      ?Weight: 47.6 kg     ? ?No intake or output data in the 24 hours ending 04/13/22 1417 ?Filed Weights  ? 04/13/22 0800  04/13/22 1213  ?Weight: 47.6 kg 47.6 kg  ? ? ?Physical Examination: ? ?General:  Average built, not in obvious distress ?HENT:   No scleral pallor or icterus noted. Oral mucosa is moist.  ?Chest:  Clear breath sounds.  Diminished breath sounds bilaterally. No crackles or wheezes.  ?CVS: S1 &S2 heard. No murmur.  Regular rate and rhythm. ?Abdomen: Soft, nontender, nondistended.  Bowel sounds are heard.   ?Extremities: No cyanosis, clubbing or edema.  Peripheral pulses are palpable.  Tenderness of the left shoulder with restricted movements.  No obvious external swelling noted.  Right upper extremity AV fistula in place. ?Psych: Alert, awake and oriented, normal mood ?CNS:  No cranial nerve deficits.  Power equal in all extremities.   ?Skin: Warm and dry.  No rashes noted. ? ?Data Reviewed:  ? ?CBC: ?Recent Labs  ?Lab 04/12/22 ?1534 04/13/22 ?0810  ?WBC 5.6 5.4  ?NEUTROABS 3.5  --   ?HGB 11.3* 10.1*  ?HCT 36.4 32.2*  ?MCV 88.8 88.2  ?PLT 266 263  ? ? ?Basic Metabolic Panel: ?Recent Labs  ?Lab 04/12/22 ?1534 04/13/22 ?0810  ?NA 133* 132*  ?K 5.0 6.2*  ?CL 91* 91*  ?CO2 22 20*  ?GLUCOSE 87 80  ?BUN 81* 94*  ?CREATININE 12.77* 14.15*  ?CALCIUM 9.1 9.0  ?MG  --  2.5*  ?PHOS  --  >30.0*  ? ? ?Liver Function Tests: ?Recent Labs  ?Lab 04/13/22 ?0810  ?AST 12*  ?ALT 11  ?ALKPHOS 63  ?BILITOT 0.7  ?PROT 8.4*  ?ALBUMIN 2.5*  ? ? ? ?Radiology Studies: ?DG Chest 2 View ? ?Result Date: 04/12/2022 ?CLINICAL DATA:  Pain left shoulder EXAM: CHEST - 2 VIEW COMPARISON:  11/28/2021 FINDINGS: Transverse diameter of heart is increased. There is blunting of both lateral CP angles, more so on the left side. Central pulmonary vessels are prominent without signs of alveolar pulmonary edema. There is slight prominence of interstitial markings in both lungs with no significant interval change. There is no new focal pulmonary consolidation. There is no pneumothorax. IMPRESSION: Cardiomegaly. Small bilateral pleural effusions, more so on the left  side. Central pulmonary vessels are prominent without signs of alveolar pulmonary edema. There is prominence of interstitial markings in both lungs which may suggest underlying scarring or mild interstitial edema or interstitial pneumonia. No new focal pulmonary consolidation is seen. Electronically Signed   By: Elmer Picker M.D.   On: 04/12/2022 16:36  ? ?MR SHOULDER LEFT W WO CONTRAST ? ?Result Date: 04/13/2022 ?CLINICAL DATA:  Severe left axillary pain for the past 2 days. EXAM: MRI OF THE LEFT SHOULDER WITHOUT AND WITH CONTRAST TECHNIQUE: Multiplanar, multisequence MR imaging of the left shoulder was performed before and after the administration of intravenous contrast. CONTRAST:  4.49mL GADAVIST GADOBUTROL 1 MMOL/ML IV SOLN COMPARISON:  Left shoulder x-rays from yesterday. FINDINGS: Rotator cuff:  Intact rotator cuff.  Mild subscapularis tendinosis. Muscles: Periscapular edema within  the supraspinatus, infraspinatus, and subscapularis muscles. No muscle atrophy. Biceps long head:  Intact and normally positioned. Acromioclavicular Joint: Normal acromioclavicular joint. Type II acromion. Trace subacromial/subdeltoid bursal fluid. Glenohumeral Joint: Large joint effusion with slightly irregular and thickened synovial enhancement. No chondral defect. Labrum:  Intact. Bones:  No marrow abnormality, fracture or dislocation. Other: Prominent soft tissue swelling in the left axilla and surrounding the proximal humerus. No fluid collection. Reactive left axillary lymph nodes. IMPRESSION: 1. Prominent soft tissue swelling in the left axilla and surrounding the proximal humerus, with large glenohumeral joint effusion. Findings are concerning for septic arthritis. 2. Periscapular edema within the supraspinatus, infraspinatus, and subscapularis muscles, which may be reactive or reflect myositis. No abscess. 3. Intact rotator cuff. Mild subscapularis tendinosis. Electronically Signed   By: Titus Dubin M.D.   On:  04/13/2022 08:09  ? ?DG Shoulder Left ? ?Result Date: 04/12/2022 ?CLINICAL DATA:  Pain left shoulder EXAM: LEFT SHOULDER - 2+ VIEW COMPARISON:  None. FINDINGS: No recent fracture or dislocation is seen. No

## 2022-04-13 NOTE — ED Notes (Signed)
To mri 

## 2022-04-14 ENCOUNTER — Inpatient Hospital Stay (HOSPITAL_COMMUNITY): Payer: Medicaid Other

## 2022-04-14 DIAGNOSIS — R778 Other specified abnormalities of plasma proteins: Secondary | ICD-10-CM | POA: Diagnosis not present

## 2022-04-14 DIAGNOSIS — M25512 Pain in left shoulder: Secondary | ICD-10-CM | POA: Diagnosis not present

## 2022-04-14 DIAGNOSIS — N186 End stage renal disease: Secondary | ICD-10-CM | POA: Diagnosis not present

## 2022-04-14 DIAGNOSIS — R509 Fever, unspecified: Secondary | ICD-10-CM | POA: Diagnosis not present

## 2022-04-14 LAB — SYNOVIAL CELL COUNT + DIFF, W/ CRYSTALS
Crystals, Fluid: NONE SEEN
Eosinophils-Synovial: 0 % (ref 0–1)
Lymphocytes-Synovial Fld: 2 % (ref 0–20)
Monocyte-Macrophage-Synovial Fluid: 13 % — ABNORMAL LOW (ref 50–90)
Neutrophil, Synovial: 85 % — ABNORMAL HIGH (ref 0–25)
WBC, Synovial: 26750 /mm3 — ABNORMAL HIGH (ref 0–200)

## 2022-04-14 LAB — CBC
HCT: 32.5 % — ABNORMAL LOW (ref 36.0–46.0)
Hemoglobin: 10.3 g/dL — ABNORMAL LOW (ref 12.0–15.0)
MCH: 27.5 pg (ref 26.0–34.0)
MCHC: 31.7 g/dL (ref 30.0–36.0)
MCV: 86.9 fL (ref 80.0–100.0)
Platelets: 285 10*3/uL (ref 150–400)
RBC: 3.74 MIL/uL — ABNORMAL LOW (ref 3.87–5.11)
RDW: 17.9 % — ABNORMAL HIGH (ref 11.5–15.5)
WBC: 4.5 10*3/uL (ref 4.0–10.5)
nRBC: 0 % (ref 0.0–0.2)

## 2022-04-14 LAB — VANCOMYCIN, RANDOM: Vancomycin Rm: 25

## 2022-04-14 LAB — HEPATITIS B SURFACE ANTIGEN: Hepatitis B Surface Ag: NONREACTIVE

## 2022-04-14 LAB — MAGNESIUM: Magnesium: 2.4 mg/dL (ref 1.7–2.4)

## 2022-04-14 LAB — PHOSPHORUS: Phosphorus: 8.8 mg/dL — ABNORMAL HIGH (ref 2.5–4.6)

## 2022-04-14 MED ORDER — LOPERAMIDE HCL 2 MG PO CAPS
2.0000 mg | ORAL_CAPSULE | ORAL | Status: DC | PRN
  Administered 2022-04-14 – 2022-04-17 (×8): 2 mg via ORAL
  Filled 2022-04-14 (×8): qty 1

## 2022-04-14 MED ORDER — LIDOCAINE HCL (PF) 1 % IJ SOLN
2.0000 mL | Freq: Once | INTRAMUSCULAR | Status: DC
  Filled 2022-04-14: qty 2

## 2022-04-14 MED ORDER — IOHEXOL 180 MG/ML  SOLN: 20.0000 mL | Freq: Once | INTRAMUSCULAR | Status: DC | PRN

## 2022-04-14 MED ORDER — PROMETHAZINE HCL 25 MG/ML IJ SOLN
INTRAMUSCULAR | Status: AC
  Administered 2022-04-14: 25 mg
  Filled 2022-04-14: qty 1

## 2022-04-14 MED ORDER — MORPHINE SULFATE (PF) 2 MG/ML IV SOLN
2.0000 mg | INTRAVENOUS | Status: DC | PRN
  Administered 2022-04-14 – 2022-04-16 (×6): 4 mg via INTRAVENOUS
  Administered 2022-04-16: 2 mg via INTRAVENOUS
  Administered 2022-04-16: 4 mg via INTRAVENOUS
  Administered 2022-04-16: 2 mg via INTRAVENOUS
  Administered 2022-04-16 (×2): 4 mg via INTRAVENOUS
  Administered 2022-04-17 (×3): 2 mg via INTRAVENOUS
  Filled 2022-04-14: qty 1
  Filled 2022-04-14 (×2): qty 2
  Filled 2022-04-14: qty 1
  Filled 2022-04-14 (×3): qty 2
  Filled 2022-04-14 (×2): qty 1
  Filled 2022-04-14 (×2): qty 2
  Filled 2022-04-14: qty 1
  Filled 2022-04-14 (×2): qty 2

## 2022-04-14 MED ORDER — DOXERCALCIFEROL 4 MCG/2ML IV SOLN
12.0000 ug | INTRAVENOUS | Status: DC
  Administered 2022-04-16: 12 ug via INTRAVENOUS
  Filled 2022-04-14 (×2): qty 6

## 2022-04-14 MED ORDER — LIDOCAINE HCL (PF) 1 % IJ SOLN
INTRAMUSCULAR | Status: AC
  Filled 2022-04-14: qty 5

## 2022-04-14 NOTE — Progress Notes (Signed)
cardiac monitor states Qtc, is >620. Dr. Alcario Drought notified of possible prolonged Qtc. ?

## 2022-04-14 NOTE — Progress Notes (Signed)
?[facility] ?Progress Note  ? ?Subjective:  Seen on HD - 2.5L UFG and tolerating. Shoulder pain slightly better but still hurting. For IR joint aspiration today? ? ?Objective ?Vitals:  ? 04/14/22 0857 04/14/22 0900 04/14/22 0930 04/14/22 1000  ?BP: (!) 112/48 (!) 109/48 (!) 123/35 (!) 113/43  ?Pulse: 83 79 69 73  ?Resp: 16 14 (!) 21 15  ?Temp:      ?TempSrc:      ?SpO2:      ?Weight:      ? ?Physical Exam ?General: Well appearing, NAD. Room air ?Heart: RRR; no murmur ?Lungs: CTA anteriorly ?Abdomen: soft ?Extremities: No LE edema ?Dialysis Access:  RUE AVF + thrill ? ?Additional Objective ?Labs: ?Basic Metabolic Panel: ?Recent Labs  ?Lab 04/12/22 ?1534 04/13/22 ?0810 04/14/22 ?0139  ?NA 133* 132* 133*  ?K 5.0 6.2* 4.6  ?CL 91* 91* 93*  ?CO2 22 20* 26  ?GLUCOSE 87 80 90  ?BUN 81* 94* 37*  ?CREATININE 12.77* 14.15* 7.74*  ?CALCIUM 9.1 9.0 8.7*  ?PHOS  --  >30.0* 8.8*  ? ?Liver Function Tests: ?Recent Labs  ?Lab 04/13/22 ?0810  ?AST 12*  ?ALT 11  ?ALKPHOS 63  ?BILITOT 0.7  ?PROT 8.4*  ?ALBUMIN 2.5*  ? ?CBC: ?Recent Labs  ?Lab 04/12/22 ?1534 04/13/22 ?0810 04/14/22 ?0139  ?WBC 5.6 5.4 4.5  ?NEUTROABS 3.5  --   --   ?HGB 11.3* 10.1* 10.3*  ?HCT 36.4 32.2* 32.5*  ?MCV 88.8 88.2 86.9  ?PLT 266 263 285  ? ?Blood Culture ?   ?Component Value Date/Time  ? SDES BLOOD SITE NOT SPECIFIED 04/12/2022 2117  ? SPECREQUEST  04/12/2022 2117  ?  BOTTLES DRAWN AEROBIC AND ANAEROBIC Blood Culture results may not be optimal due to an inadequate volume of blood received in culture bottles  ? CULT  04/12/2022 2117  ?  NO GROWTH 2 DAYS ?Performed at [facility], [place] [address] [place], [place] [address] ?  ? REPTSTATUS PENDING 04/12/2022 2117  ? ?Studies/Results: ?DG Chest 2 View ? ?Result Date: 04/12/2022 ?CLINICAL DATA:  Pain left shoulder EXAM: CHEST - 2 VIEW COMPARISON:  11/28/2021 FINDINGS: Transverse diameter of heart is increased. There is blunting of both lateral CP angles, more so on the left side. Central  pulmonary vessels are prominent without signs of alveolar pulmonary edema. There is slight prominence of interstitial markings in both lungs with no significant interval change. There is no new focal pulmonary consolidation. There is no pneumothorax. IMPRESSION: Cardiomegaly. Small bilateral pleural effusions, more so on the left side. Central pulmonary vessels are prominent without signs of alveolar pulmonary edema. There is prominence of interstitial markings in both lungs which may suggest underlying scarring or mild interstitial edema or interstitial pneumonia. No new focal pulmonary consolidation is seen. Electronically Signed   By: Elmer Picker M.D.   On: 04/12/2022 16:36  ? ?MR SHOULDER LEFT W WO CONTRAST ? ?Result Date: 04/13/2022 ?CLINICAL DATA:  Severe left axillary pain for the past 2 days. EXAM: MRI OF THE LEFT SHOULDER WITHOUT AND WITH CONTRAST TECHNIQUE: Multiplanar, multisequence MR imaging of the left shoulder was performed before and after the administration of intravenous contrast. CONTRAST:  4.72mL GADAVIST GADOBUTROL 1 MMOL/ML IV SOLN COMPARISON:  Left shoulder x-rays from yesterday. FINDINGS: Rotator cuff:  Intact rotator cuff.  Mild subscapularis tendinosis. Muscles: Periscapular edema within the supraspinatus, infraspinatus, and subscapularis muscles. No muscle atrophy. Biceps long head:  Intact and normally positioned. Acromioclavicular Joint: Normal acromioclavicular joint. Type II  acromion. Trace subacromial/subdeltoid bursal fluid. Glenohumeral Joint: Large joint effusion with slightly irregular and thickened synovial enhancement. No chondral defect. Labrum:  Intact. Bones:  No marrow abnormality, fracture or dislocation. Other: Prominent soft tissue swelling in the left axilla and surrounding the proximal humerus. No fluid collection. Reactive left axillary lymph nodes. IMPRESSION: 1. Prominent soft tissue swelling in the left axilla and surrounding the proximal humerus, with large  glenohumeral joint effusion. Findings are concerning for septic arthritis. 2. Periscapular edema within the supraspinatus, infraspinatus, and subscapularis muscles, which may be reactive or reflect myositis. No abscess. 3. Intact rotator cuff. Mild subscapularis tendinosis. Electronically Signed   By: Titus Dubin M.D.   On: 04/13/2022 08:09  ? ?DG Shoulder Left ? ?Result Date: 04/12/2022 ?CLINICAL DATA:  Pain left shoulder EXAM: LEFT SHOULDER - 2+ VIEW COMPARISON:  None. FINDINGS: No recent fracture or dislocation is seen. No abnormal soft tissue calcifications are noted adjacent to the proximal left humerus. There are coarse calcifications in the medial aspect of left upper arm which has not changed significantly in comparison with an earlier examination done on 01/20/2019. IMPRESSION: No radiographic abnormality is seen in the left shoulder. Electronically Signed   By: Elmer Picker M.D.   On: 04/12/2022 16:37  ? ?UE VENOUS DUPLEX (7am - 7pm) ? ?Result Date: 04/13/2022 ?UPPER VENOUS STUDY  Patient Name:  [name]  Date of Exam:   04/12/2022 Medical Rec #: [id number]       Accession #:    [id number] Date of Birth: 23-Aug-1981      Patient Gender: F Patient Age:   41 years Exam Location:  [facility] Procedure:      VAS [name] UPPER EXTREMITY VENOUS DUPLEX Referring Phys: MADISON [facility] --------------------------------------------------------------------------------  Indications: Pain in axilla with movement of left arm Risk Factors: History of ligation of left arm fistula 07/2020. Comparison Study: No prior study on file Performing Technologist: Sharion Dove RVS  Examination Guidelines: A complete evaluation includes B-mode imaging, spectral Doppler, color Doppler, and power Doppler as needed of all accessible portions of each vessel. Bilateral testing is considered an integral part of a complete examination. Limited examinations for reoccurring indications may be performed as noted.  Right  Findings: +----------+------------+---------+-----------+----------+-------+ RIGHT     CompressiblePhasicitySpontaneousPropertiesSummary +----------+------------+---------+-----------+----------+-------+ Subclavian               Yes       Yes                      +----------+------------+---------+-----------+----------+-------+  Left Findings: +----------+------------+---------+-----------+----------+-------+ LEFT      CompressiblePhasicitySpontaneousPropertiesSummary +----------+------------+---------+-----------+----------+-------+ IJV           Full       Yes       Yes                      +----------+------------+---------+-----------+----------+-------+ Subclavian               Yes       Yes                      +----------+------------+---------+-----------+----------+-------+ Axillary                 Yes       Yes                      +----------+------------+---------+-----------+----------+-------+ Brachial      Full                                          +----------+------------+---------+-----------+----------+-------+  Radial        Full                                          +----------+------------+---------+-----------+----------+-------+ Ulnar         Full                                          +----------+------------+---------+-----------+----------+-------+ Cephalic      Full                                          +----------+------------+---------+-----------+----------+-------+ Basilic       Full                                          +----------+------------+---------+-----------+----------+-------+  *See table(s) above for measurements and observations.  Diagnosing physician: Servando Snare MD Electronically signed by Servando Snare MD on 04/13/2022 at 5:38:17 PM.    Final    ?Medications: ? piperacillin-tazobactam (ZOSYN)  IV 2.25 g (04/14/22 0538)  ? vancomycin    ? ? carvedilol  12.5 mg Oral Q breakfast  ?  Chlorhexidine Gluconate Cloth  6 each Topical Daily  ? darbepoetin (ARANESP) injection - DIALYSIS  40 mcg Intravenous Q Mon-HD  ? feeding supplement (NEPRO CARB STEADY)  237 mL Oral Once per day on Tue Thu Sat  ? heparin  5,000 Units Subcutaneous Q8H  ? sevelamer carbonate  2,400 mg Oral TI

## 2022-04-14 NOTE — Progress Notes (Signed)
?  04/14/22 1220  ?Vitals  ?Temp 97.6 ?F (36.4 ?C)  ?Temp Source Oral  ?BP (!) 138/46  ?MAP (mmHg) 89  ?BP Location Left Leg  ?BP Method Automatic  ?Patient Position (if appropriate) Lying  ?Pulse Rate 88  ?Pulse Rate Source Monitor  ?Resp 15  ?Oxygen Therapy  ?SpO2 93 %  ?O2 Device Room Air  ?Dialysis Weight  ?Weight 44.2 kg  ?Type of Weight Post-Dialysis  ?Post-Hemodialysis Assessment  ?Rinseback Volume (mL) 250 mL  ?KECN 253 V  ?Dialyzer Clearance Lightly streaked  ?Duration of HD Treatment -hour(s) 3.5 hour(s)  ?Hemodialysis Intake (mL) 600 mL  ?UF Total -Machine (mL) 1825 mL  ?Net UF (mL) 1225 mL  ?Tolerated HD Treatment No (Comment) ?(see progress note)  ?Post-Hemodialysis Comments tx complete, hypotensive at times, NS bolus and phenergan given with positive effects  ?AVG/AVF Arterial Site Held (minutes) 10 minutes  ?AVG/AVF Venous Site Held (minutes) 10 minutes  ? ?HD tx complete, goal not met due to emesis x1, pt unable to tolerate pulling anymore fluid. Brooke Bonito on unit and made aware. Phenergan IV given per Katie-PA, effectiveness noted. Intermittent hypotension noted, UF paused at times and eventually stopped for remainder of tx. Restless at times, requiring frequent reminders and redirection to keep arm still while being dialyzed. Frequently moving around causing some bleeding at AV needle sites, areas addressed and dialysis resumed.  ?

## 2022-04-14 NOTE — Progress Notes (Signed)
?  Transition of Care (TOC) Screening Note ? ? ?Patient Details  ?Name: [name] ?Date of Birth: Apr 13, 1981 ? ? ?Transition of Care (TOC) CM/SW Contact:    ?Benard Halsted, LCSW ?Phone Number: ?04/14/2022, 1:06 PM ? ? ? ?Transition of Care Department [facility]) has reviewed patient and no TOC needs have been identified at this time. We will continue to monitor patient advancement through interdisciplinary progression rounds. If new patient transition needs arise, please place a TOC consult. ? ? ?

## 2022-04-14 NOTE — Plan of Care (Signed)
°  Problem: Coping: °Goal: Level of anxiety will decrease °Outcome: Progressing °  °

## 2022-04-14 NOTE — Significant Event (Addendum)
Called by RN:  Pt EKG showing QTc of 640. ? ?On my review; however, I think the computer is calling P waves on the EKG as T waves.  And actual QTc, while slightly prolonged, is not anywhere near 640.  (Actual T waves best seen in leads V3-V6). ? ?Never the less, in an abundance of caution: ?Check mag and phos this AM stat ?Paged cards on call to see what their take on the EKG read is. ? ?Update: Dr. Rudi Rummage states: "Hey Dr Alcario Drought, I do NOT believe the QTC is 640. the patient has 1st degree AV block and all her prior EKG are the same. it might be slightly prolonged in 500s, very hard to see it properly. may be leads V3 and V6 are the best. Avoid QT prolonging drugs as much as possible. " ?

## 2022-04-14 NOTE — Progress Notes (Signed)
?PROGRESS NOTE ? ? ? ?[name]  HDQ:[id number] DOB: 05-13-1981 DOA: 04/12/2022 ?PCP: Camillia Herter, NP  ? ? ?Brief Narrative:  ?  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  In the ED, patient was noted to be febrile with a temperature of 100.9 ?F and tachypnea tachycardia.  Labs showed hyponatremia with elevated creatinine at 12.7 and mildly elevated troponins.  CRP of 7.7, ESR of 121 and lactate was 1.7.  Left upper extremity ultrasound showed no evidence of DVT.  X-ray of the shoulder without any acute abnormality.  Chest x-ray showed cardiomegaly and small pleural effusions.  Patient was then admitted hospital for further evaluation and treatment. ? ?Assessment and plan ? ?SIRS possibly secondary to acute febrile illness  ?Unclear etiology at this time.  Possible lupus flare/infection of the left shoulder..  Follow blood cultures negative in 2 days.  Empirically on vancomycin and Zosyn.  CRP and ESR was significantly elevated.  Orthopedics was consulted from the ED who recommended MRI of the left shoulder. .  Lactate was 0.8 on repeat..  No leukocytosis today.  Temperature max of 100.2 Fahrenheit.  Awaiting aspiration of the left shoulder joint under fluoroscopic guidance. ? ?Hyperkalemia. ?Potassium of 6.2 on 04/13/2022 and underwent hemodialysis.  Seen during hemodialysis again today. ? ?Left shoulder pain with decreased range of movement.  No gross effusion or redness from outside but decreased range of movement. ?MRI of the left shoulder showed prominent soft tissue swelling with large glenohumeral joint effusion concerning for septic arthritis.  Orthopedics on board and recommend IR guided patient of the joint to rule out septic arthritis. ? ?Hypertensive urgency- ?  On Coreg.  As needed hydralazine.  We will continue to monitor. ? ?Elevated troponin possibly secondary to type II demand ischemia ?Troponin 89 > 88;  flat troponins.  No chest pain. ? ?GERD ?Continue Protonix ? ?History of HFrEF ?Continue Coreg ? ?Anemia of chronic kidney disease/chronic disease.   ?Patient is on weekly Aranesp.  Nephrology managing. ? ?ESRD on HD ([facility]) ?Had missed 1 dialysis session.  Nephrology on board for hemodialysis.  Patient received hemodialysis yesterday and today. ? ?History of systemic lupus erythematosus.  Not on any maintenance medication at this time.  Denies other joint issues at this time. ?  ? ? DVT prophylaxis: heparin injection 5,000 Units Start: 04/13/22 0730 ?SCDs Start: 04/13/22 0724 ? ? ?Code Status:   ?  Code Status: Full Code ? ?Disposition: Home likely in 1 to 2 days. ? ?Status is: Inpatient ? ?Remains inpatient appropriate because: IV antibiotic,  possible shoulder infection, awaiting IR guided aspiration ? ? Family Communication:  ?Spoke with the patient at bedside. ? ?Consultants:  ?Orthopedics ?Nephrology ?Interventional radiology ? ?Procedures:  ?Hemodialysis ? ?Antimicrobials:  ?Vancomycin and Zosyn ? ?Anti-infectives (From admission, onward)  ? ? Start     Dose/Rate Route Frequency Ordered Stop  ? 04/14/22 1200  vancomycin (VANCOREADY) IVPB 500 mg/100 mL       ? 500 mg ?100 mL/hr over 60 Minutes Intravenous Every T-Th-Sa (Hemodialysis) 04/12/22 2131    ? 04/12/22 2200  piperacillin-tazobactam (ZOSYN) IVPB 2.25 g       ? 2.25 g ?100 mL/hr over 30 Minutes Intravenous Every 8 hours 04/12/22 2137    ? 04/12/22 2145  vancomycin (VANCOCIN) IVPB 1000 mg/200 mL premix       ? 1,000 mg ?200 mL/hr over 60 Minutes Intravenous  Once 04/12/22 2131 04/12/22 2315  ? ?  ? ? ?Subjective: ?Today, patient was seen and examined at bedside.  Seen during hemodialysis.  Denies any shortness of breath or chest pain but complains of persistent left shoulder pain and stiffness decreased  range of movement and pain on mobility ? ?Objective: ?Vitals:  ? 04/14/22 0900 04/14/22 0930 04/14/22 1000 04/14/22 1030  ?BP: (!) 109/48 (!) 123/35 (!) 113/43 (!) 99/45  ?Pulse: 79 69 73 74  ?Resp: 14 (!) $Remo'21 15 12  'WWOgP$ ?Temp:      ?TempSrc:      ?SpO2:      ?Weight:      ? ? ?Intake/Output Summary (Last 24 hours) at 04/14/2022 1130 ?Last data filed at 04/13/2022 1549 ?Gross per 24 hour  ?Intake --  ?Output 2100 ml  ?Net -2100 ml  ? ?Filed Weights  ? 04/13/22 0800 04/13/22 1213 04/14/22 0820  ?Weight: 47.6 kg 47.6 kg 47.5 kg  ? ? ?Physical Examination: ?Body mass index is 17.97 kg/m?.  ? ?General: Thinly built, not in obvious distress ?HENT:   No scleral pallor or icterus noted. Oral mucosa is moist.  ?Chest:  Clear breath sounds.  Diminished breath sounds bilaterally. No crackles or wheezes.  ?CVS: S1 &S2 heard. No murmur.  Regular rate and rhythm. ?Abdomen: Soft, nontender, nondistended.  Bowel sounds are heard.   ?Extremities: No cyanosis, clubbing or edema.  Peripheral pulses are palpable.  Tenderness with restricted movement of the left shoulder joint.   Right upper extremity AV fistula in place. ?Psych: Alert, awake and oriented, normal mood ?CNS:  No cranial nerve deficits.  Power equal in all extremities.   ?Skin: Warm and dry.  No rashes noted. ? ?Data Reviewed:  ? ?CBC: ?Recent Labs  ?Lab 04/12/22 ?1534 04/13/22 ?0810 04/14/22 ?0139  ?WBC 5.6 5.4 4.5  ?NEUTROABS 3.5  --   --   ?HGB 11.3* 10.1* 10.3*  ?HCT 36.4 32.2* 32.5*  ?MCV 88.8 88.2 86.9  ?PLT 266 263 285  ? ? ?Basic Metabolic Panel: ?Recent Labs  ?Lab 04/12/22 ?1534 04/13/22 ?0810 04/14/22 ?0139  ?NA 133* 132* 133*  ?K 5.0 6.2* 4.6  ?CL 91* 91* 93*  ?CO2 22 20* 26  ?GLUCOSE 87 80 90  ?BUN 81* 94* 37*  ?CREATININE 12.77* 14.15* 7.74*  ?CALCIUM 9.1 9.0 8.7*  ?MG  --  2.5* 2.4  ?PHOS  --  >30.0* 8.8*  ? ? ?Liver Function Tests: ?Recent Labs  ?Lab 04/13/22 ?0810  ?AST 12*  ?ALT 11  ?ALKPHOS 63  ?BILITOT 0.7  ?PROT 8.4*  ?ALBUMIN 2.5*  ? ? ? ?Radiology  Studies: ?DG Chest 2 View ? ?Result Date: 04/12/2022 ?CLINICAL DATA:  Pain left shoulder EXAM: CHEST - 2 VIEW COMPARISON:  11/28/2021 FINDINGS: Transverse diameter of heart is increased. There is blunting of both lateral CP angles, more so on the left side. Central pulmonary vessels are prominent without signs of alveolar pulmonary edema. There is slight prominence of interstitial markings in both lungs with no significant interval change. There is no new focal pulmonary consolidation. There is no pneumothorax. IMPRESSION: Cardiomegaly. Small bilateral pleural effusions, more so on the left side. Central pulmonary vessels are prominent without signs of alveolar pulmonary edema. There is prominence of interstitial markings in both lungs which may suggest underlying scarring or mild interstitial edema or interstitial pneumonia. No new focal  pulmonary consolidation is seen. Electronically Signed   By: Elmer Picker M.D.   On: 04/12/2022 16:36  ? ?MR SHOULDER LEFT W WO CONTRAST ? ?Result Date: 04/13/2022 ?CLINICAL DATA:  Severe left axillary pain for the past 2 days. EXAM: MRI OF THE LEFT SHOULDER WITHOUT AND WITH CONTRAST TECHNIQUE: Multiplanar, multisequence MR imaging of the left shoulder was performed before and after the administration of intravenous contrast. CONTRAST:  4.40mL GADAVIST GADOBUTROL 1 MMOL/ML IV SOLN COMPARISON:  Left shoulder x-rays from yesterday. FINDINGS: Rotator cuff:  Intact rotator cuff.  Mild subscapularis tendinosis. Muscles: Periscapular edema within the supraspinatus, infraspinatus, and subscapularis muscles. No muscle atrophy. Biceps long head:  Intact and normally positioned. Acromioclavicular Joint: Normal acromioclavicular joint. Type II acromion. Trace subacromial/subdeltoid bursal fluid. Glenohumeral Joint: Large joint effusion with slightly irregular and thickened synovial enhancement. No chondral defect. Labrum:  Intact. Bones:  No marrow abnormality, fracture or dislocation.  Other: Prominent soft tissue swelling in the left axilla and surrounding the proximal humerus. No fluid collection. Reactive left axillary lymph nodes. IMPRESSION: 1. Prominent soft tissue swelling in the left axi

## 2022-04-14 NOTE — Progress Notes (Signed)
Pharmacy Antibiotic Note ? ?[name] is a 41 y.o. female admitted on 04/12/2022 with sepsis.  Pharmacy has been consulted for zosyn and vancomycin dosing. ? ?Of note, patient has a h/o ESRD on [facility] HD.  ? ?Random vanc post HD tonight came back at 25 so within goal. We will continue with the same dose ?Plan: ?-Zosyn 2.25 gm IV Q 8 hours  ?-Continue vancomycin 500 mg IV with each HD session ?-Monitor CBC, renal fx, cultures and clinical progress ? ? ?Weight: 44.2 kg (97 lb 7.1 oz) ? ?Temp (24hrs), Avg:98.3 ?F (36.8 ?C), Min:97.6 ?F (36.4 ?C), Max:100.2 ?F (37.9 ?C) ? ?Recent Labs  ?Lab 04/12/22 ?1534 04/12/22 ?1824 04/12/22 ?2117 04/13/22 ?0810 04/14/22 ?0139 04/14/22 ?1655  ?WBC 5.6  --   --  5.4 4.5  --   ?CREATININE 12.77*  --   --  14.15* 7.74*  --   ?LATICACIDVEN  --  1.7 0.8  --   --   --   ?VANCORANDOM  --   --   --   --   --  25  ? ?  ?Estimated Creatinine Clearance: 6.9 mL/min (A) (by C-G formula based on SCr of 7.74 mg/dL (H)).   ? ?Allergies  ?Allergen Reactions  ? Cephalosporins Rash  ?  To both keflex and cefazolin  ? Tobramycin Sulfate Swelling  ?  Eye swelling  ? ? ?Antimicrobials this admission: ?Zosyn 4/16 >>  ?Vancomycin 4/16 >>  ? ?Dose adjustments this admission: ? ? ?Microbiology results: ?4/16 BCx: ngtd ? ?Onnie Boer, PharmD, BCIDP, AAHIVP, CPP ?Infectious Disease Pharmacist ?04/14/2022 6:27 PM ? ? ? ? ?

## 2022-04-14 NOTE — Progress Notes (Signed)
BP dropped on HD, vomiting. UF off - BP improving. She was asking for something for nausea. Avoiding Zofran d/t QT prolongation on overnight EKG. IV Phenergan given. ? ? ?Veneta Penton, PA-C ?[facility] ?Pager [phone] ? ?

## 2022-04-14 NOTE — Plan of Care (Signed)
°  Problem: Clinical Measurements: °Goal: Will remain free from infection °Outcome: Progressing °  °Problem: Activity: °Goal: Risk for activity intolerance will decrease °Outcome: Progressing °  °Problem: Nutrition: °Goal: Adequate nutrition will be maintained °Outcome: Progressing °  °

## 2022-04-14 NOTE — Progress Notes (Signed)
Dr. Alcario Drought at bedside reviewing 12-lead EKG. ?

## 2022-04-15 DIAGNOSIS — M25512 Pain in left shoulder: Secondary | ICD-10-CM | POA: Diagnosis not present

## 2022-04-15 DIAGNOSIS — N186 End stage renal disease: Secondary | ICD-10-CM | POA: Diagnosis not present

## 2022-04-15 DIAGNOSIS — R509 Fever, unspecified: Secondary | ICD-10-CM | POA: Diagnosis not present

## 2022-04-15 DIAGNOSIS — R778 Other specified abnormalities of plasma proteins: Secondary | ICD-10-CM | POA: Diagnosis not present

## 2022-04-15 LAB — BASIC METABOLIC PANEL
Anion gap: 14 (ref 5–15)
Anion gap: 15 (ref 5–15)
BUN: 37 mg/dL — ABNORMAL HIGH (ref 6–20)
BUN: 31 mg/dL — ABNORMAL HIGH (ref 6–20)
CO2: 26 mmol/L (ref 22–32)
CO2: 27 mmol/L (ref 22–32)
Calcium: 8.7 mg/dL — ABNORMAL LOW (ref 8.9–10.3)
Calcium: 8.9 mg/dL (ref 8.9–10.3)
Chloride: 93 mmol/L — ABNORMAL LOW (ref 98–111)
Chloride: 90 mmol/L — ABNORMAL LOW (ref 98–111)
Creatinine, Ser: 7.74 mg/dL — ABNORMAL HIGH (ref 0.44–1.00)
Creatinine, Ser: 5.7 mg/dL — ABNORMAL HIGH (ref 0.44–1.00)
GFR, Estimated: 6 mL/min — ABNORMAL LOW (ref >60)
GFR, Estimated: 9 mL/min — ABNORMAL LOW (ref >60)
Glucose, Bld: 90 mg/dL (ref 70–99)
Glucose, Bld: 77 mg/dL (ref 70–99)
Potassium: 4.6 mmol/L (ref 3.5–5.1)
Potassium: 4 mmol/L (ref 3.5–5.1)
Sodium: 133 mmol/L — ABNORMAL LOW (ref 135–145)
Sodium: 132 mmol/L — ABNORMAL LOW (ref 135–145)

## 2022-04-15 LAB — CBC
HCT: 34.2 % — ABNORMAL LOW (ref 36.0–46.0)
Hemoglobin: 10.4 g/dL — ABNORMAL LOW (ref 12.0–15.0)
MCH: 26.9 pg (ref 26.0–34.0)
MCHC: 30.4 g/dL (ref 30.0–36.0)
MCV: 88.6 fL (ref 80.0–100.0)
Platelets: 252 10*3/uL (ref 150–400)
RBC: 3.86 MIL/uL — ABNORMAL LOW (ref 3.87–5.11)
RDW: 17.8 % — ABNORMAL HIGH (ref 11.5–15.5)
WBC: 3.9 10*3/uL — ABNORMAL LOW (ref 4.0–10.5)
nRBC: 0 % (ref 0.0–0.2)

## 2022-04-15 LAB — MAGNESIUM: Magnesium: 2.2 mg/dL (ref 1.7–2.4)

## 2022-04-15 LAB — HEPATITIS B SURFACE ANTIBODY, QUANTITATIVE: Hep B S AB Quant (Post): 160.9 m[IU]/mL (ref >9.9)

## 2022-04-15 MED ORDER — LIDOCAINE 5 % EX PTCH
1.0000 | MEDICATED_PATCH | CUTANEOUS | Status: DC
  Administered 2022-04-15: 1 via TRANSDERMAL
  Filled 2022-04-15: qty 1

## 2022-04-15 NOTE — Progress Notes (Signed)
Reviewed labs with Dr. Stann Mainland - with WBC at 27k this is lower than expected for acute infection with negative gram stain. At this point recommendation is for continued conservative treatment and we will follow cultures as of now.  ?

## 2022-04-15 NOTE — Progress Notes (Addendum)
?PROGRESS NOTE ? ? ? ?[name]  RFF:[id number] DOB: [birth year] DOA: 04/12/2022 ?PCP: Camillia Herter, NP  ? ? ?Brief Narrative:  ? 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  In the ED, patient was noted to be febrile with a temperature of 100.9 ?F and tachypnea tachycardia.  Labs showed hyponatremia with elevated creatinine at 12.7 and mildly elevated troponins.  CRP of 7.7, ESR of 121 and lactate was 1.7.  Left upper extremity ultrasound showed no evidence of DVT.  X-ray of the shoulder without any acute abnormality.  Chest x-ray showed cardiomegaly and small pleural effusions.  Patient was then admitted hospital for further evaluation and treatment. ? ?Assessment and plan ? ?Principal Problem: ?  Acute febrile illness ?Active Problems: ?  Essential hypertension ?  ESRD on dialysis [facility]) ?  Left shoulder pain ?  Hypertensive urgency ?  GERD (gastroesophageal reflux disease) ?  HFrEF (heart failure with reduced ejection fraction) ([place]) ?  Elevated troponin ?  SIRS (systemic inflammatory response syndrome) ([facility]) ?  ?SIRS  ?Unclear etiology at this time.  Possible lupus flare/infection of the left shoulder. Blood cultures negative in 2 days.  Empirically on vancomycin and Zosyn.  CRP and ESR was significantly elevated.  Orthopedics was consulted from the ED who recommended MRI of the left shoulder and fluoroscopic guidance and aspiration of the left shoulder. ? ?Left shoulder pain with joint effusion.  Concern for septic arthritis.  Patient had restricted range of motion but has improved after joint aspiration.  MRI of the left shoulder showed prominent soft tissue swelling with large glenohumeral joint effusion concerning for septic arthritis.  Status post fluoroscopic guided  joint aspiration of the left shoulder.  Communicated with orthopedics this morning.  Recommend following joint fluid culture.  Continue with IV antibiotics for now.  Fluid culture negative in less than 24 hours.  Blood cultures negative in 3 days.  Mild leukopenia noted.  Afebrile. ? ?Hyperkalemia. ?Corrected with hemodialysis.  Latest potassium of 4.6. ? ?Hypertensive urgency- ? On Coreg.  As needed hydralazine.  We will continue to monitor.  Blood pressure marginally low this morning.  We will put holding parameters. ? ?Elevated troponin possibly secondary to type II demand ischemia ?Troponin 89 > 88;  flat troponins.  No chest pain.  Unremarkable. ? ?GERD ?Continue Protonix ? ?History of HFrEF ?Continue Coreg.  Volume managed by hemodialysis. ? ?Anemia of chronic kidney disease/chronic disease.   ?Patient is on weekly Aranesp.  Nephrology managing. ? ?ESRD on HD ([facility]) ?Continue hemodialysis during hospitalization. ? ?History of systemic lupus erythematosus.  Not on any maintenance medication at this time.  Denies other joint issues at this time. ?  ? ? DVT prophylaxis: heparin injection 5,000 Units Start: 04/13/22 0730 ?SCDs Start: 04/13/22 0724 ? ? ?Code Status:   ?  Code Status: Full Code ? ?Disposition: Home likely in 1 to 2 days. ? ?Status is: Inpatient ? ?Remains inpatient appropriate because: IV antibiotic,  possible shoulder infection, status post IR guided aspiration ? ? Family Communication:  ?Spoke with the patient at bedside. ? ?Consultants:  ?Orthopedics ?Nephrology ?Interventional radiology ? ?Procedures:  ?Hemodialysis ?Fluoroscopic guided needle aspiration of the left shoulder joint on 04/14/2022 by IR ? ?Antimicrobials:  ?Vancomycin and Zosyn IV 4/16> ? ?Anti-infectives (  From admission, onward)  ? ? Start     Dose/Rate Route Frequency Ordered Stop  ? 04/14/22 1200  vancomycin (VANCOREADY) IVPB 500 mg/100 mL       ? 500 mg ?100 mL/hr over 60 Minutes Intravenous Every T-Th-Sa (Hemodialysis) 04/12/22  2131    ? 04/12/22 2200  piperacillin-tazobactam (ZOSYN) IVPB 2.25 g       ? 2.25 g ?100 mL/hr over 30 Minutes Intravenous Every 8 hours 04/12/22 2137    ? 04/12/22 2145  vancomycin (VANCOCIN) IVPB 1000 mg/200 mL premix       ? 1,000 mg ?200 mL/hr over 60 Minutes Intravenous  Once 04/12/22 2131 04/12/22 2315  ? ?  ? ? ?Subjective: ?Today, patient was seen and examined at bedside.  Feels little better with her arm and shoulder.  Improved with range of movement.  Still has pain on mobility.   ? ?Objective: ?Vitals:  ? 04/14/22 2345 04/15/22 0325 04/15/22 0903 04/15/22 1104  ?BP: (!) 120/47 (!) 116/51 (!) 100/54 (!) 104/50  ?Pulse: 90  78 71  ?Resp: $Remov'18 10 13 17  'VHKGKr$ ?Temp: 98.5 ?F (36.9 ?C) 98.1 ?F (36.7 ?C) 98.9 ?F (37.2 ?C)   ?TempSrc: Oral Oral Oral   ?SpO2: 95% 97%    ?Weight:      ? ? ?Intake/Output Summary (Last 24 hours) at 04/15/2022 1415 ?Last data filed at 04/15/2022 0542 ?Gross per 24 hour  ?Intake 480 ml  ?Output --  ?Net 480 ml  ? ?Filed Weights  ? 04/13/22 1213 04/14/22 0820 04/14/22 1220  ?Weight: 47.6 kg 47.5 kg 44.2 kg  ? ? ?Physical Examination: ?Body mass index is 16.73 kg/m?.  ? ?General: Thinly built, not in obvious distress ?HENT:   No scleral pallor or icterus noted. Oral mucosa is moist.  ?Chest:  Clear breath sounds.  Diminished breath sounds bilaterally. No crackles or wheezes.  ?CVS: S1 &S2 heard. No murmur.  Regular rate and rhythm. ?Abdomen: Soft, nontender, nondistended.  Bowel sounds are heard.   ?Extremities: No cyanosis, clubbing or edema.  Peripheral pulses are palpable.  Right upper extremity AV fistula in place.  Left shoulder with improved range of movement but still tenderness on palpation. ?Psych: Alert, awake and oriented, normal mood ?CNS:  No cranial nerve deficits.  Power equal in all extremities.   ?Skin: Warm and dry.  No rashes noted. ? ?Data Reviewed:  ? ?CBC: ?Recent Labs  ?Lab 04/12/22 ?1534 04/13/22 ?0810 04/14/22 ?0139 04/15/22 ?0108  ?WBC 5.6 5.4 4.5 3.9*  ?NEUTROABS 3.5   --   --   --   ?HGB 11.3* 10.1* 10.3* 10.4*  ?HCT 36.4 32.2* 32.5* 34.2*  ?MCV 88.8 88.2 86.9 88.6  ?PLT 266 263 285 252  ? ? ?Basic Metabolic Panel: ?Recent Labs  ?Lab 04/12/22 ?1534 04/13/22 ?0810 04/14/22 ?0139 04/15/22 ?0108  ?NA 133* 132* 133* 132*  ?K 5.0 6.2* 4.6 4.0  ?CL 91* 91* 93* 90*  ?CO2 22 20* 26 27  ?GLUCOSE 87 80 90 77  ?BUN 81* 94* 37* 31*  ?CREATININE 12.77* 14.15* 7.74* 5.70*  ?CALCIUM 9.1 9.0 8.7* 8.9  ?MG  --  2.5* 2.4 2.2  ?PHOS  --  >30.0* 8.8*  --   ? ? ?Liver Function Tests: ?Recent Labs  ?Lab 04/13/22 ?0810  ?AST 12*  ?ALT 11  ?ALKPHOS 63  ?BILITOT 0.7  ?PROT 8.4*  ?ALBUMIN 2.5*  ? ? ? ?Radiology Studies: ?DG FLUORO GUIDED NEEDLE PLC ASPIRATION/INJECTION LOC ? ?Result Date: 04/14/2022 ?CLINICAL DATA:  Left shoulder pain with decreased range of motion. Febrile. Effusion on imaging, concern for septic arthritis. EXAM: LEFT SHOULDER ARTHROCENTESIS UNDER FLUOROSCOPY TECHNIQUE: An appropriate skin entrance site was determined. The site was marked, prepped with Betadine, draped in the usual sterile fashion, and infiltrated locally with 1% Lidocaine. An 18 gauge needle was advanced into the glenohumeral joint under intermittent fluoroscopy. Synovial fluid was aspirated. The needle was subsequently removed and the site was bandaged. The fluid was sent to lab as requested. No immediate post-procedure complication was apparent. FLUOROSCOPY: Fluoroscopy time: 12 seconds. Radiation Exposure Index (as provided by the fluoroscopic device): 0.20 mGy FINDINGS: 13 mL of synovial fluid was aspirated from the left glenohumeral joint and sent to the lab for analysis. The needle was removed and the site was bandaged. No immediate post-procedure complication was apparent. IMPRESSION: Technically successful left shoulder arthrocentesis under fluoroscopy. 13 mL of synovial fluid sent to the laboratory for analysis. Read and performed by: Alexandria Lodge, PA-C Electronically Signed   By: Kellie Simmering D.O.   On:  04/14/2022 15:35   ? ? ? LOS: 3 days  ? ? ?Flora Lipps, MD ?[facility] ?Available via Epic secure chat 7am-7pm ?After these hours, please refer to coverage provider listed on [email or web address] ?4/19

## 2022-04-15 NOTE — Progress Notes (Signed)
?[facility] ?Progress Note  ? ?Subjective:  Seen in room. No CP/dyspnea today or overnight. S/p L shoulder joint aspiration yesterday, gram stain without bacteria but heavy WBC, cell count 26,750, Cx negative. But, she had been on IV abx for 2 days prior to Cx - suspect is septic arthritis. Unclear if ortho will want to washout joint or just continue abx. ? ?Objective ?Vitals:  ? 04/14/22 1939 04/14/22 2345 04/15/22 0325 04/15/22 0903  ?BP: (!) 106/58 (!) 120/47 (!) 116/51 (!) 100/54  ?Pulse: 93 90  78  ?Resp: 19 18 10 13   ?Temp: 98.4 ?F (36.9 ?C) 98.5 ?F (36.9 ?C) 98.1 ?F (36.7 ?C) 98.9 ?F (37.2 ?C)  ?TempSrc: Oral Oral Oral Oral  ?SpO2: 97% 95% 97%   ?Weight:      ? ?Physical Exam ?General: Well appearing, NAD. Room air ?Heart: RRR; no murmur ?Lungs: CTA anteriorly ?Abdomen: soft ?Extremities: No LE edema ?Dialysis Access:  RUE AVF + thrill ? ?Additional Objective ?Labs: ?Basic Metabolic Panel: ?Recent Labs  ?Lab 04/13/22 ?0810 04/14/22 ?0139 04/15/22 ?0108  ?NA 132* 133* 132*  ?K 6.2* 4.6 4.0  ?CL 91* 93* 90*  ?CO2 20* 26 27  ?GLUCOSE 80 90 77  ?BUN 94* 37* 31*  ?CREATININE 14.15* 7.74* 5.70*  ?CALCIUM 9.0 8.7* 8.9  ?PHOS >30.0* 8.8*  --   ? ?Liver Function Tests: ?Recent Labs  ?Lab 04/13/22 ?0810  ?AST 12*  ?ALT 11  ?ALKPHOS 63  ?BILITOT 0.7  ?PROT 8.4*  ?ALBUMIN 2.5*  ? ?CBC: ?Recent Labs  ?Lab 04/12/22 ?1534 04/13/22 ?0810 04/14/22 ?0139 04/15/22 ?0108  ?WBC 5.6 5.4 4.5 3.9*  ?NEUTROABS 3.5  --   --   --   ?HGB 11.3* 10.1* 10.3* 10.4*  ?HCT 36.4 32.2* 32.5* 34.2*  ?MCV 88.8 88.2 86.9 88.6  ?PLT 266 263 285 252  ? ?Blood Culture ?   ?Component Value Date/Time  ? SDES SYNOVIAL FLUID 04/14/2022 1440  ? SPECREQUEST LEFT SHOULDER 04/14/2022 1440  ? CULT  04/14/2022 1440  ?  NO GROWTH < 24 HOURS ?Performed at [facility], [place] [address], [place], [place] [address] ?  ? REPTSTATUS PENDING 04/14/2022 1440  ? ?Studies/Results: ?DG FLUORO GUIDED NEEDLE PLC ASPIRATION/INJECTION LOC ? ?Result Date:  04/14/2022 ?CLINICAL DATA:  Left shoulder pain with decreased range of motion. Febrile. Effusion on imaging, concern for septic arthritis. EXAM: LEFT SHOULDER ARTHROCENTESIS UNDER FLUOROSCOPY TECHNIQUE: An appropriate skin entrance site was determined. The site was marked, prepped with Betadine, draped in the usual sterile fashion, and infiltrated locally with 1% Lidocaine. An 18 gauge needle was advanced into the glenohumeral joint under intermittent fluoroscopy. Synovial fluid was aspirated. The needle was subsequently removed and the site was bandaged. The fluid was sent to lab as requested. No immediate post-procedure complication was apparent. FLUOROSCOPY: Fluoroscopy time: 12 seconds. Radiation Exposure Index (as provided by the fluoroscopic device): 0.20 mGy FINDINGS: 13 mL of synovial fluid was aspirated from the left glenohumeral joint and sent to the lab for analysis. The needle was removed and the site was bandaged. No immediate post-procedure complication was apparent. IMPRESSION: Technically successful left shoulder arthrocentesis under fluoroscopy. 13 mL of synovial fluid sent to the laboratory for analysis. Read and performed by: Alexandria Lodge, PA-C Electronically Signed   By: Kellie Simmering D.O.   On: 04/14/2022 15:35   ? ?Medications: ? piperacillin-tazobactam (ZOSYN)  IV 2.25 g (04/15/22 0752)  ? vancomycin Stopped (04/14/22 1217)  ? ? carvedilol  12.5 mg  Oral Q breakfast  ? Chlorhexidine Gluconate Cloth  6 each Topical Daily  ? darbepoetin (ARANESP) injection - DIALYSIS  40 mcg Intravenous Q Mon-HD  ? [START ON 04/16/2022] doxercalciferol  12 mcg Intravenous Q T,Th,Sa-HD  ? feeding supplement (NEPRO CARB STEADY)  237 mL Oral Once per day on Tue Thu Sat  ? heparin  5,000 Units Subcutaneous Q8H  ? lidocaine (PF)  2 mL Intradermal Once  ? sevelamer carbonate  2,400 mg Oral TID WC  ? ? ?Dialysis Orders: ?TTS at [facility] - last HD 4/12, left at 46.9kg ?3:45hr, 400/A1.5, EDW 46.5kg, 2K/2Ca, AVF,  heparin 4000U bolus ?- Hectoral 74mcg IV q HD ?- Mircera 22mcg IV q 2 wks (last 4/1) ?- Venofer 100mg  x 10 ordered - s/p 6 doses. ?  ?Assessment/Plan: ? L shoulder pain + stiffness/possible septic arthritis: MRI with large effusion/soft tissue swelling concerning for septic arthritis. No recent known illness/bacteremic events. AVF looks fine. Tmax 100.19F in ED. Started on Vanc/Zosyn. Ortho consulted. IR performed joint aspiration 4/18: Cell count >27K, gram stain WBC only, Cx negative (but had been on abx for 2 days at that time). Plan pending, per ortho. ? ESRD:  Usual TTS schedule. Next HD 4/20. ? Hypertension/volume: BP fine and no LE edema, but trying to get fluid off based on CXR/pleural effusions. ? Anemia: Hgb 10.4 - continue Aranesp q Monday while here. ? Metabolic bone disease: Ca ok, Phos > 30 on admit -> repeat 8.8, consistent with her usual levels. Continue Renvela as binder/VDRA. ? SLE: No maintenance medication in the past few years. Does not have rheumatologist - can arrange as outpatient. Has had at least 2 episodes of severe joint pain/effusions related to her SLE (hip 11/2021 + elbow 12/2018). At minimum, she should restart Plaquenil. ? HFrEF (EF 30-35% in 10/2020) ? ?Veneta Penton, PA-C ?04/15/2022, 10:15 AM  ?[facility] ? ? ? ?

## 2022-04-15 NOTE — Progress Notes (Signed)
Pt receives out-pt HD at [facility] on TTS. Pt arrives at 6:50 for 7:05 chair time. Will assist as needed. ? ?Melven Sartorius ?Renal Navigator ?[phone] ?

## 2022-04-16 DIAGNOSIS — N186 End stage renal disease: Secondary | ICD-10-CM | POA: Diagnosis not present

## 2022-04-16 DIAGNOSIS — R509 Fever, unspecified: Secondary | ICD-10-CM | POA: Diagnosis not present

## 2022-04-16 DIAGNOSIS — R778 Other specified abnormalities of plasma proteins: Secondary | ICD-10-CM | POA: Diagnosis not present

## 2022-04-16 DIAGNOSIS — M25512 Pain in left shoulder: Secondary | ICD-10-CM | POA: Diagnosis not present

## 2022-04-16 LAB — CBC WITH DIFFERENTIAL/PLATELET
Abs Immature Granulocytes: 0.01 10*3/uL (ref 0.00–0.07)
Basophils Absolute: 0 10*3/uL (ref 0.0–0.1)
Basophils Relative: 1 %
Eosinophils Absolute: 0.2 10*3/uL (ref 0.0–0.5)
Eosinophils Relative: 5 %
HCT: 32.5 % — ABNORMAL LOW (ref 36.0–46.0)
Hemoglobin: 10 g/dL — ABNORMAL LOW (ref 12.0–15.0)
Immature Granulocytes: 0 %
Lymphocytes Relative: 22 %
Lymphs Abs: 1 10*3/uL (ref 0.7–4.0)
MCH: 27.1 pg (ref 26.0–34.0)
MCHC: 30.8 g/dL (ref 30.0–36.0)
MCV: 88.1 fL (ref 80.0–100.0)
Monocytes Absolute: 0.7 10*3/uL (ref 0.1–1.0)
Monocytes Relative: 16 %
Neutro Abs: 2.4 10*3/uL (ref 1.7–7.7)
Neutrophils Relative %: 56 %
Platelets: 270 10*3/uL (ref 150–400)
RBC: 3.69 MIL/uL — ABNORMAL LOW (ref 3.87–5.11)
RDW: 17.5 % — ABNORMAL HIGH (ref 11.5–15.5)
WBC: 4.3 10*3/uL (ref 4.0–10.5)
nRBC: 0 % (ref 0.0–0.2)

## 2022-04-16 LAB — BASIC METABOLIC PANEL
Anion gap: 15 (ref 5–15)
BUN: 49 mg/dL — ABNORMAL HIGH (ref 6–20)
CO2: 26 mmol/L (ref 22–32)
Calcium: 9.2 mg/dL (ref 8.9–10.3)
Chloride: 91 mmol/L — ABNORMAL LOW (ref 98–111)
Creatinine, Ser: 8.3 mg/dL — ABNORMAL HIGH (ref 0.44–1.00)
GFR, Estimated: 6 mL/min — ABNORMAL LOW (ref >60)
Glucose, Bld: 74 mg/dL (ref 70–99)
Potassium: 4.7 mmol/L (ref 3.5–5.1)
Sodium: 132 mmol/L — ABNORMAL LOW (ref 135–145)

## 2022-04-16 MED ORDER — LORAZEPAM 0.5 MG PO TABS
0.5000 mg | ORAL_TABLET | Freq: Once | ORAL | Status: AC
  Administered 2022-04-16: 0.5 mg via ORAL
  Filled 2022-04-16 (×2): qty 1

## 2022-04-16 MED ORDER — ONDANSETRON HCL 4 MG/2ML IJ SOLN
4.0000 mg | Freq: Four times a day (QID) | INTRAMUSCULAR | Status: DC | PRN
  Administered 2022-04-16: 4 mg via INTRAVENOUS
  Filled 2022-04-16: qty 2

## 2022-04-16 NOTE — Progress Notes (Signed)
? [facility] ?Progress Note  ? ?Subjective:   Seen in room. Says shoulder feels a little better, but had R facial swelling and itching with Vanc as well as diarrhea -> abx now stopped per ortho. Will add Vancomycin to her allergy list. No dyspnea. For HD today. ? ?Objective ?Vitals:  ? 04/16/22 0152 04/16/22 0518 04/16/22 0732 04/16/22 1146  ?BP:  107/86 (!) 116/49 (!) 121/52  ?Pulse:  90 80 86  ?Resp:  20 14 16   ?Temp:  98.2 ?F (36.8 ?C) 97.8 ?F (36.6 ?C) 98.5 ?F (36.9 ?C)  ?TempSrc:  Oral Oral Oral  ?SpO2: 96% 96% 98% 98%  ?Weight:      ? ?Physical Exam ?General: Well appearing, NAD. Room air. R side lips/facial swelling. ?Heart: RRR; no murmur ?Lungs: CTA anteriorly ?Abdomen: soft ?Extremities: No LE edema ?Dialysis Access:  RUE AVF + thrill ? ?Additional Objective ?Labs: ?Basic Metabolic Panel: ?Recent Labs  ?Lab 04/13/22 ?0810 04/14/22 ?0139 04/15/22 ?0108 04/16/22 ?0102  ?NA 132* 133* 132* 132*  ?K 6.2* 4.6 4.0 4.7  ?CL 91* 93* 90* 91*  ?CO2 20* 26 27 26   ?GLUCOSE 80 90 77 74  ?BUN 94* 37* 31* 49*  ?CREATININE 14.15* 7.74* 5.70* 8.30*  ?CALCIUM 9.0 8.7* 8.9 9.2  ?PHOS >30.0* 8.8*  --   --   ? ?Liver Function Tests: ?Recent Labs  ?Lab 04/13/22 ?0810  ?AST 12*  ?ALT 11  ?ALKPHOS 63  ?BILITOT 0.7  ?PROT 8.4*  ?ALBUMIN 2.5*  ? ?CBC: ?Recent Labs  ?Lab 04/12/22 ?1534 04/13/22 ?0810 04/14/22 ?0139 04/15/22 ?0108 04/16/22 ?0102  ?WBC 5.6 5.4 4.5 3.9* 4.3  ?NEUTROABS 3.5  --   --   --  2.4  ?HGB 11.3* 10.1* 10.3* 10.4* 10.0*  ?HCT 36.4 32.2* 32.5* 34.2* 32.5*  ?MCV 88.8 88.2 86.9 88.6 88.1  ?PLT 266 263 285 252 270  ? ?Studies/Results: ?DG FLUORO GUIDED NEEDLE PLC ASPIRATION/INJECTION LOC ? ?Result Date: 04/14/2022 ?CLINICAL DATA:  Left shoulder pain with decreased range of motion. Febrile. Effusion on imaging, concern for septic arthritis. EXAM: LEFT SHOULDER ARTHROCENTESIS UNDER FLUOROSCOPY TECHNIQUE: An appropriate skin entrance site was determined. The site was marked, prepped with Betadine, draped  in the usual sterile fashion, and infiltrated locally with 1% Lidocaine. An 18 gauge needle was advanced into the glenohumeral joint under intermittent fluoroscopy. Synovial fluid was aspirated. The needle was subsequently removed and the site was bandaged. The fluid was sent to lab as requested. No immediate post-procedure complication was apparent. FLUOROSCOPY: Fluoroscopy time: 12 seconds. Radiation Exposure Index (as provided by the fluoroscopic device): 0.20 mGy FINDINGS: 13 mL of synovial fluid was aspirated from the left glenohumeral joint and sent to the lab for analysis. The needle was removed and the site was bandaged. No immediate post-procedure complication was apparent. IMPRESSION: Technically successful left shoulder arthrocentesis under fluoroscopy. 13 mL of synovial fluid sent to the laboratory for analysis. Read and performed by: Alexandria Lodge, PA-C Electronically Signed   By: Kellie Simmering D.O.   On: 04/14/2022 15:35   ? ?Medications: ? ? carvedilol  12.5 mg Oral Q breakfast  ? Chlorhexidine Gluconate Cloth  6 each Topical Daily  ? darbepoetin (ARANESP) injection - DIALYSIS  40 mcg Intravenous Q Mon-HD  ? doxercalciferol  12 mcg Intravenous Q T,Th,Sa-HD  ? feeding supplement (NEPRO CARB STEADY)  237 mL Oral Once per day on Tue Thu Sat  ? heparin  5,000 Units Subcutaneous Q8H  ? lidocaine  1 patch Transdermal Q24H  ?  lidocaine (PF)  2 mL Intradermal Once  ? sevelamer carbonate  2,400 mg Oral TID WC  ? ? ?Dialysis Orders: ?TTS at [facility] - last HD 4/12, left at 46.9kg ?3:45hr, 400/A1.5, EDW 46.5kg, 2K/2Ca, AVF, heparin 4000U bolus ?- Hectoral 32mcg IV q HD ?- Mircera 52mcg IV q 2 wks (last 4/1) ?- Venofer 100mg  x 10 ordered - s/p 6 doses. ?  ?Assessment/Plan: ? L shoulder pain + stiffness/possible septic arthritis: MRI with large effusion/soft tissue swelling concerning for septic arthritis. No recent known illness/bacteremic events. AVF looks fine. Tmax 100.70F in ED. Started on  Vanc/Zosyn. Ortho consulted. IR performed joint aspiration 4/18: Cell count >27K, gram stain WBC only, Cx negative (but had been on abx for 2 days at that time). Per ortho, felt not c/w infection. Abx d/c'd 4/20 - also has had reaction with Vancomycin -> NEED TO ADD TO OUTPATIENT ALLERGY LIST. ? ESRD:  Usual TTS schedule. HD later today. ? Hypertension/volume: BP fine/no LE edema, low UF goal as tolerated. Now below outpatient dry weight -> reduce on d/c. ? Anemia: Hgb 10 - continue Aranesp q Monday while here. ? Metabolic bone disease: Ca ok, Phos > 30 on admit -> repeat 8.8, consistent with her usual levels. Continue Renvela as binder/VDRA. ? SLE: No maintenance medication in the past few years. Does not have rheumatologist - can arrange as outpatient. Has had at least 2 episodes of severe joint pain/effusions related to her SLE (hip 11/2021 + elbow 12/2018). At minimum, she should restart Plaquenil. ? HFrEF (EF 30-35% in 10/2020) ? ?Veneta Penton, PA-C ?04/16/2022, 2:38 PM  ?[facility] ? ? ? ?

## 2022-04-16 NOTE — Plan of Care (Signed)

## 2022-04-16 NOTE — Progress Notes (Signed)
?PROGRESS NOTE ? ? ? ?[name]  UMP:[id number] DOB: 09-Oct-1981 DOA: 04/12/2022 ?PCP: Camillia Herter, NP  ? ? ?Brief Narrative:  ? 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  In the ED, patient was noted to be febrile with a temperature of 100.9 ?F and tachypnea tachycardia.  Labs showed hyponatremia with elevated creatinine at 12.7 and mildly elevated troponins.  CRP of 7.7, ESR of 121 and lactate was 1.7.  Left upper extremity ultrasound showed no evidence of DVT.  X-ray of the shoulder without any acute abnormality.  Chest x-ray showed cardiomegaly and small pleural effusions.  Patient was then admitted hospital for further evaluation and treatment. ? ?Assessment and plan ? ?Principal Problem: ?  Acute febrile illness ?Active Problems: ?  Essential hypertension ?  ESRD on dialysis [facility]) ?  Left shoulder pain ?  Hypertensive urgency ?  GERD (gastroesophageal reflux disease) ?  HFrEF (heart failure with reduced ejection fraction) ([place]) ?  Elevated troponin ?  SIRS (systemic inflammatory response syndrome) (H[facility]) ?  ?SIRS  ?Unclear etiology at this time.  Patient did have a left shoulder joint effusion which was aspirated but no obvious signs of infection at this time.  Blood cultures negative in 4 days.  Empirically on vancomycin and Zosyn.  Communicated with orthopedics.  At this time there is no clear-cut infection and cultures are negative.  Orthopedics recommend discontinuation of antibiotic and follow-up.  CRP and ESR was significantly elevated.   ? ?Left shoulder pain with joint effusion.  Concern for septic arthritis.  Patient had restricted range of motion but has improved after joint aspiration.  MRI of the left shoulder showed prominent soft tissue swelling with  large glenohumeral joint effusion concerning for septic arthritis.  Status post fluoroscopic guided joint aspiration of the left shoulder.  Communicated with orthopedics again this morning.  No indication of infection on the Gram stain and culture so far.  Orthopedic recommends discontinuation of antibiotic and clinical follow-up.  Patient also had allergic reaction to vancomycin this morning.  Has remained afebrile.  No leukocytosis. ? ?Hyperkalemia. ?Corrected with hemodialysis.  Latest potassium of 4.7 ? ?Hypertensive urgency- ?On presentation.  Continue  Coreg with holding parameters.  As needed hydralazine.   ? ?Elevated troponin possibly secondary to type II demand ischemia ?Troponin 89 > 88;  flat troponins.  No chest pain.   ? ?GERD ?Continue Protonix ? ?History of HFrEF ?Continue Coreg.  Volume managed by hemodialysis. ? ?Anemia of chronic kidney disease/chronic disease.   ?Patient is on weekly Aranesp.  Nephrology managing. ? ?ESRD on HD (TThS) ?Continue hemodialysis during hospitalization. ? ?History of systemic lupus erythematosus.  Not on any maintenance medication at this time.  Denies other joint issues at this time. ?  ? ? DVT prophylaxis: heparin injection 5,000 Units Start: 04/13/22 0730 ?SCDs Start: 04/13/22 0724 ? ? ?Code Status:   ?  Code Status: Full Code ? ?Disposition: Home likely in 1 to 2 days if continues to improve clinically. ? ?Status is: Inpatient ? ?Remains inpatient appropriate because: Pending clinical improvement, observation off antibiotic, status post IR guided aspiration ? ? Family Communication:  ?Spoke with the patient at bedside. ? ?Consultants:  ?Orthopedics ?Nephrology ?Interventional radiology ? ?Procedures:  ?Hemodialysis ?Fluoroscopic guided needle aspiration of the  left shoulder joint on 04/14/2022 by IR ? ?Antimicrobials:  ?Vancomycin and Zosyn IV 4/16>4/20 ? ?Anti-infectives (From admission, onward)  ? ? Start     Dose/Rate Route Frequency Ordered Stop  ? 04/14/22 1200   vancomycin (VANCOREADY) IVPB 500 mg/100 mL  Status:  Discontinued       ? 500 mg ?100 mL/hr over 60 Minutes Intravenous Every T-Th-Sa (Hemodialysis) 04/12/22 2131 04/16/22 1116  ? 04/12/22 2200  piperacillin-tazobactam (ZOSYN) IVPB 2.25 g  Status:  Discontinued       ? 2.25 g ?100 mL/hr over 30 Minutes Intravenous Every 8 hours 04/12/22 2137 04/16/22 1116  ? 04/12/22 2145  vancomycin (VANCOCIN) IVPB 1000 mg/200 mL premix       ? 1,000 mg ?200 mL/hr over 60 Minutes Intravenous  Once 04/12/22 2131 04/12/22 2315  ? ?  ? ? ?Subjective: ?Today, patient was seen and examined at bedside.  States that her lips got swollen including her periorbital area after receiving vancomycin.  We will add vancomycin to the allergy list.  Range of movement and pain over the shoulder has significantly improved.   ? ?Objective: ?Vitals:  ? 04/16/22 0100 04/16/22 0152 04/16/22 0518 04/16/22 0732  ?BP: 135/72  107/86 (!) 116/49  ?Pulse:   90 80  ?Resp:   20 14  ?Temp:   98.2 ?F (36.8 ?C) 97.8 ?F (36.6 ?C)  ?TempSrc:   Oral Oral  ?SpO2:  96% 96% 98%  ?Weight:      ? ?No intake or output data in the 24 hours ending 04/16/22 1120 ? ?Filed Weights  ? 04/13/22 1213 04/14/22 0820 04/14/22 1220  ?Weight: 47.6 kg 47.5 kg 44.2 kg  ? ? ?Physical Examination: ?Body mass index is 16.73 kg/m?.  ? ?General: Thinly built, not in obvious distress ?HENT:   No scleral pallor or icterus noted. Oral mucosa is moist.  ?Chest:  Clear breath sounds.  Diminished breath sounds bilaterally. No crackles or wheezes.  ?CVS: S1 &S2 heard. No murmur.  Regular rate and rhythm. ?Abdomen: Soft, nontender, nondistended.  Bowel sounds are heard.   ?Extremities: No cyanosis, clubbing or edema.  Peripheral pulses are palpable.  Right ?Upper extremity fistula in place.  Left shoulder with improved mobility and range of motion ?Psych: Alert, awake and oriented, normal mood ?CNS:  No cranial nerve deficits.  Power equal in all extremities.   ?Skin: Warm and dry.  No rashes  noted. ? ? ?Data Reviewed:  ? ?CBC: ?Recent Labs  ?Lab 04/12/22 ?1534 04/13/22 ?0810 04/14/22 ?0139 04/15/22 ?0108 04/16/22 ?0102  ?WBC 5.6 5.4 4.5 3.9* 4.3  ?NEUTROABS 3.5  --   --   --  2.4  ?HGB 11.3* 10.1* 10.3* 10.4* 10.0*  ?HCT 36.4 32.2* 32.5* 34.2* 32.5*  ?MCV 88.8 88.2 86.9 88.6 88.1  ?PLT 266 263 285 252 270  ? ? ?Basic Metabolic Panel: ?Recent Labs  ?Lab 04/12/22 ?1534 04/13/22 ?0810 04/14/22 ?0139 04/15/22 ?0108 04/16/22 ?0102  ?NA 133* 132* 133* 132* 132*  ?K 5.0 6.2* 4.6 4.0 4.7  ?CL 91* 91* 93* 90* 91*  ?CO2 22 20* _0 ?GLUCOSE 87 80 90 77 74  ?BUN 81* 94* 37* 31* 49*  ?CREATININE 12.77* 14.15* 7.74* 5.70* 8.30*  ?CALCIUM 9.1 9.0 8.7* 8.9 9.2  ?MG  --  2.5* 2.4 2.2  --   ?PHOS  --  >30.0* 8.8*  --   --   ? ? ?Liver Function Tests: ?Recent Labs  ?Lab 04/13/22 ?0810  ?AST 12*  ?  ALT 11  ?ALKPHOS 63  ?BILITOT 0.7  ?PROT 8.4*  ?ALBUMIN 2.5*  ? ? ? ?Radiology Studies: ?DG FLUORO GUIDED NEEDLE PLC ASPIRATION/INJECTION LOC ? ?Result Date: 04/14/2022 ?CLINICAL DATA:  Left shoulder pain with decreased range of motion. Febrile. Effusion on imaging, concern for septic arthritis. EXAM: LEFT SHOULDER ARTHROCENTESIS UNDER FLUOROSCOPY TECHNIQUE: An appropriate skin entrance site was determined. The site was marked, prepped with Betadine, draped in the usual sterile fashion, and infiltrated locally with 1% Lidocaine. An 18 gauge needle was advanced into the glenohumeral joint under intermittent fluoroscopy. Synovial fluid was aspirated. The needle was subsequently removed and the site was bandaged. The fluid was sent to lab as requested. No immediate post-procedure complication was apparent. FLUOROSCOPY: Fluoroscopy time: 12 seconds. Radiation Exposure Index (as provided by the fluoroscopic device): 0.20 mGy FINDINGS: 13 mL of synovial fluid was aspirated from the left glenohumeral joint and sent to the lab for analysis. The needle was removed and the site was bandaged. No immediate post-procedure complication  was apparent. IMPRESSION: Technically successful left shoulder arthrocentesis under fluoroscopy. 13 mL of synovial fluid sent to the laboratory for analysis. Read and performed by: Alexandria Lodge, PA-C Elect[employer]

## 2022-04-17 DIAGNOSIS — M25512 Pain in left shoulder: Secondary | ICD-10-CM | POA: Diagnosis not present

## 2022-04-17 DIAGNOSIS — N186 End stage renal disease: Secondary | ICD-10-CM | POA: Diagnosis not present

## 2022-04-17 DIAGNOSIS — R778 Other specified abnormalities of plasma proteins: Secondary | ICD-10-CM | POA: Diagnosis not present

## 2022-04-17 DIAGNOSIS — R509 Fever, unspecified: Secondary | ICD-10-CM | POA: Diagnosis not present

## 2022-04-17 LAB — BASIC METABOLIC PANEL
Anion gap: 11 (ref 5–15)
BUN: 13 mg/dL (ref 6–20)
CO2: 30 mmol/L (ref 22–32)
Calcium: 8.7 mg/dL — ABNORMAL LOW (ref 8.9–10.3)
Chloride: 95 mmol/L — ABNORMAL LOW (ref 98–111)
Creatinine, Ser: 4.33 mg/dL — ABNORMAL HIGH (ref 0.44–1.00)
GFR, Estimated: 13 mL/min — ABNORMAL LOW (ref >60)
Glucose, Bld: 99 mg/dL (ref 70–99)
Potassium: 3.3 mmol/L — ABNORMAL LOW (ref 3.5–5.1)
Sodium: 136 mmol/L (ref 135–145)

## 2022-04-17 LAB — CBC
HCT: 30.8 % — ABNORMAL LOW (ref 36.0–46.0)
Hemoglobin: 9.6 g/dL — ABNORMAL LOW (ref 12.0–15.0)
MCH: 27.4 pg (ref 26.0–34.0)
MCHC: 31.2 g/dL (ref 30.0–36.0)
MCV: 88 fL (ref 80.0–100.0)
Platelets: 251 10*3/uL (ref 150–400)
RBC: 3.5 MIL/uL — ABNORMAL LOW (ref 3.87–5.11)
RDW: 17.5 % — ABNORMAL HIGH (ref 11.5–15.5)
WBC: 3.7 10*3/uL — ABNORMAL LOW (ref 4.0–10.5)
nRBC: 0 % (ref 0.0–0.2)

## 2022-04-17 LAB — CULTURE, BLOOD (ROUTINE X 2)
Culture: NO GROWTH
Culture: NO GROWTH
Special Requests: ADEQUATE

## 2022-04-17 MED ORDER — DIPHENHYDRAMINE HCL 25 MG PO CAPS
25.0000 mg | ORAL_CAPSULE | Freq: Once | ORAL | Status: AC
  Administered 2022-04-17: 25 mg via ORAL
  Filled 2022-04-17: qty 1

## 2022-04-17 MED ORDER — POTASSIUM CHLORIDE CRYS ER 20 MEQ PO TBCR: 40.0000 meq | EXTENDED_RELEASE_TABLET | Freq: Once | ORAL | Status: DC

## 2022-04-17 MED ORDER — LIDOCAINE 5 % EX PTCH: 1.0000 | MEDICATED_PATCH | CUTANEOUS | 0 refills | Status: AC

## 2022-04-17 NOTE — Progress Notes (Signed)
Patient is alert and orientated. Discharge instructions discussea and reviewed with this patient and understanding voiced. ?

## 2022-04-17 NOTE — Progress Notes (Signed)
D/C order noted. Contacted [facility] to make clinic aware pt will d/c today and resume care tomorrow.  ? ?Melven Sartorius ?Renal Navigator ?[phone] ?

## 2022-04-17 NOTE — Discharge Summary (Signed)
?Physician Discharge Summary ?  ?Patient: [name] MRN: [id number] DOB: 09-Apr-1981  ?Admit date:     04/12/2022  ?Discharge date: 04/17/22  ?Discharge Physician: [name]  ? ?PCP: Camillia Herter, NP  ? ?Recommendations at discharge:  ? ?Follow-up with primary care physician in 1 week.   ?Check CBC, BMP, LFTs, magnesium in the next visit. ?Would recommend a referral to  rheumatology as outpatient.  She did have left shoulder joint effusion without infection.  ESR and CRP was significantly elevated on presentation.  Has clinically improved at this time but would definitely benefit from a rheumatology follow-up. ?Follow-up with [facility] in 2 weeks for shoulder pain follow up. ? ?Discharge Diagnoses: ?Principal Problem: ?  Acute febrile illness ?Active Problems: ?  Essential hypertension ?  ESRD on dialysis [facility]) ?  Left shoulder pain ?  Hypertensive urgency ?  GERD (gastroesophageal reflux disease) ?  HFrEF (heart failure with reduced ejection fraction) ([place]) ?  Elevated troponin ?  SIRS (systemic inflammatory response syndrome) (HCC) ? ?Resolved Problems: ?  * No resolved hospital problems. * ? ?Hospital Course: ?Patient is a 41 years old female with past medical history of end-stage renal disease on hemodialysis, Tuesday Thursday Saturday, left breast Referral heart failure with reduced ejection fraction EF of 30 to 35% presented hospital with left arm pain difficulty moving due to pain.  In the ED, patient was noted to be febrile with a temperature of 100.9 ?F and tachypnea tachycardia.  Labs showed hyponatremia with elevated creatinine at 12.7 and mildly elevated troponins.  CRP of 7.7, ESR of 121 and lactate was 1.7.  Left upper extremity ultrasound showed no evidence of DVT.  X-ray of the shoulder without any acute abnormality.  Chest x-ray showed cardiomegaly and small pleural effusions.  Patient was then admitted hospital for further evaluation and treatment. ? ?Assessment and  plan ? ?Principal Problem: ?  Acute febrile illness ?Active Problems: ?  Essential hypertension ?  ESRD on dialysis [facility]) ?  Left shoulder pain ?  Hypertensive urgency ?  GERD (gastroesophageal reflux disease) ?  HFrEF (heart failure with reduced ejection fraction) ([place]) ?  Elevated troponin ?  SIRS (systemic inflammatory response syndrome) (HCC) ?  ?SIRS  ?Unclear etiology at this time.  Patient did have a left shoulder joint effusion which was aspirated but no obvious signs of infection at this time.  Blood cultures negative in 5 days.  Patient was initially empirically on vancomycin and Zosyn.  Antibiotics were subsequently discontinued since Gram stain was negative and culture was negative from the joint fluid.   ? ?Left shoulder pain with joint effusion.  Initial concern for septic arthritis.  Septic arthritis has been ruled out at this time.  Patient did have a joint effusion which improved after aspiration.  Range of movement and pain has significantly improved at this time.  MRI of the left shoulder showed prominent soft tissue swelling with large glenohumeral joint effusion concerning for septic arthritis.  Status post fluoroscopic guided joint aspiration of the left shoulder.  Patient was initially on antibiotic which has been discontinued.  Patient continues to improve clinically and is afebrile at this time.  No leukocytosis.  Patient was advised to follow-up with rheumatologist as outpatient since this could be manifestation of lupus flare.. ? ?Hypokalemia. ?Replenished with potassium prior to discharge.  Could be adjusted with hemodialysis if necessary. ? ?Hypertensive urgency- ?On presentation.  Continue  Coreg with holding parameters.  Blood pressure is controlled at  ?Physician Discharge Summary ?  ?Patient: [name] MRN: [id number] DOB: 09-Apr-1981  ?Admit date:     04/12/2022  ?Discharge date: 04/17/22  ?Discharge Physician: [name]  ? ?PCP: Camillia Herter, NP  ? ?Recommendations at discharge:  ? ?Follow-up with primary care physician in 1 week.   ?Check CBC, BMP, LFTs, magnesium in the next visit. ?Would recommend a referral to  rheumatology as outpatient.  She did have left shoulder joint effusion without infection.  ESR and CRP was significantly elevated on presentation.  Has clinically improved at this time but would definitely benefit from a rheumatology follow-up. ?[facility]up [facility] in 2 weeks for shoulder pain follow up. ? ?Discharge Diagnoses: ?Principal Problem: ?  Acute febrile illness ?Active Problems: ?  Essential hypertension ?  ESRD on dialysis [facility]) ?  Left shoulder pain ?  Hypertensive urgency ?  GERD (gastroesophageal reflux disease) ?  HFrEF (heart failure with reduced ejection fraction) ([place]) ?  Elevated troponin ?  SIRS (systemic inflammatory response syndrome) (HCC) ? ?Resolved Problems: ?  * No resolved hospital problems. * ? ?Hospital Course: ?Patient is a 41 years old female with past medical history of end-stage renal disease on hemodialysis, Tuesday Thursday Saturday, left breast Referral heart failure with reduced ejection fraction EF of 30 to 35% presented hospital with left arm pain difficulty moving due to pain.  In the ED, patient was noted to be febrile with a temperature of 100.9 ?F and tachypnea tachycardia.  Labs showed hyponatremia with elevated creatinine at 12.7 and mildly elevated troponins.  CRP of 7.7, ESR of 121 and lactate was 1.7.  Left upper extremity ultrasound showed no evidence of DVT.  X-ray of the shoulder without any acute abnormality.  Chest x-ray showed cardiomegaly and small pleural effusions.  Patient was then admitted hospital for further evaluation and treatment. ? ?Assessment and  plan ? ?Principal Problem: ?  Acute febrile illness ?Active Problems: ?  Essential hypertension ?  ESRD on dialysis [facility]) ?  Left shoulder pain ?  Hypertensive urgency ?  GERD (gastroesophageal reflux disease) ?  HFrEF (heart failure with reduced ejection fraction) ([place]) ?  Elevated troponin ?  SIRS (systemic inflammatory response syndrome) (HCC) ?  ?SIRS  ?Unclear etiology at this time.  Patient did have a left shoulder joint effusion which was aspirated but no obvious signs of infection at this time.  Blood cultures negative in 5 days.  Patient was initially empirically on vancomycin and Zosyn.  Antibiotics were subsequently discontinued since Gram stain was negative and culture was negative from the joint fluid.   ? ?Left shoulder pain with joint effusion.  Initial concern for septic arthritis.  Septic arthritis has been ruled out at this time.  Patient did have a joint effusion which improved after aspiration.  Range of movement and pain has significantly improved at this time.  MRI of the left shoulder showed prominent soft tissue swelling with large glenohumeral joint effusion concerning for septic arthritis.  Status post fluoroscopic guided joint aspiration of the left shoulder.  Patient was initially on antibiotic which has been discontinued.  Patient continues to improve clinically and is afebrile at this time.  No leukocytosis.  Patient was advised to follow-up with rheumatologist as outpatient since this could be manifestation of lupus flare.. ? ?Hypokalemia. ?Replenished with potassium prior to discharge.  Could be adjusted with hemodialysis if necessary. ? ?Hypertensive urgency- ?On presentation.  Continue  Coreg with holding parameters.  Blood pressure is controlled at  ?Physician Discharge Summary ?  ?Patient: [name] MRN: [id number] DOB: 09-Apr-1981  ?Admit date:     04/12/2022  ?Discharge date: 04/17/22  ?Discharge Physician: [name]  ? ?PCP: Camillia Herter, NP  ? ?Recommendations at discharge:  ? ?Follow-up with primary care physician in 1 week.   ?Check CBC, BMP, LFTs, magnesium in the next visit. ?Would recommend a referral to  rheumatology as outpatient.  She did have left shoulder joint effusion without infection.  ESR and CRP was significantly elevated on presentation.  Has clinically improved at this time but would definitely benefit from a rheumatology follow-up. ?[facility]up [facility] in 2 weeks for shoulder pain follow up. ? ?Discharge Diagnoses: ?Principal Problem: ?  Acute febrile illness ?Active Problems: ?  Essential hypertension ?  ESRD on dialysis [facility]) ?  Left shoulder pain ?  Hypertensive urgency ?  GERD (gastroesophageal reflux disease) ?  HFrEF (heart failure with reduced ejection fraction) ([place]) ?  Elevated troponin ?  SIRS (systemic inflammatory response syndrome) (HCC) ? ?Resolved Problems: ?  * No resolved hospital problems. * ? ?Hospital Course: ?Patient is a 41 years old female with past medical history of end-stage renal disease on hemodialysis, Tuesday Thursday Saturday, left breast Referral heart failure with reduced ejection fraction EF of 30 to 35% presented hospital with left arm pain difficulty moving due to pain.  In the ED, patient was noted to be febrile with a temperature of 100.9 ?F and tachypnea tachycardia.  Labs showed hyponatremia with elevated creatinine at 12.7 and mildly elevated troponins.  CRP of 7.7, ESR of 121 and lactate was 1.7.  Left upper extremity ultrasound showed no evidence of DVT.  X-ray of the shoulder without any acute abnormality.  Chest x-ray showed cardiomegaly and small pleural effusions.  Patient was then admitted hospital for further evaluation and treatment. ? ?Assessment and  plan ? ?Principal Problem: ?  Acute febrile illness ?Active Problems: ?  Essential hypertension ?  ESRD on dialysis [facility]) ?  Left shoulder pain ?  Hypertensive urgency ?  GERD (gastroesophageal reflux disease) ?  HFrEF (heart failure with reduced ejection fraction) ([place]) ?  Elevated troponin ?  SIRS (systemic inflammatory response syndrome) (HCC) ?  ?SIRS  ?Unclear etiology at this time.  Patient did have a left shoulder joint effusion which was aspirated but no obvious signs of infection at this time.  Blood cultures negative in 5 days.  Patient was initially empirically on vancomycin and Zosyn.  Antibiotics were subsequently discontinued since Gram stain was negative and culture was negative from the joint fluid.   ? ?Left shoulder pain with joint effusion.  Initial concern for septic arthritis.  Septic arthritis has been ruled out at this time.  Patient did have a joint effusion which improved after aspiration.  Range of movement and pain has significantly improved at this time.  MRI of the left shoulder showed prominent soft tissue swelling with large glenohumeral joint effusion concerning for septic arthritis.  Status post fluoroscopic guided joint aspiration of the left shoulder.  Patient was initially on antibiotic which has been discontinued.  Patient continues to improve clinically and is afebrile at this time.  No leukocytosis.  Patient was advised to follow-up with rheumatologist as outpatient since this could be manifestation of lupus flare.. ? ?Hypokalemia. ?Replenished with potassium prior to discharge.  Could be adjusted with hemodialysis if necessary. ? ?Hypertensive urgency- ?On presentation.  Continue  Coreg with holding parameters.  Blood pressure is controlled at

## 2022-04-17 NOTE — Progress Notes (Signed)
TRH night cross cover note: ? ?I was notified by RN of patient's dry, cracked lips associated with mild pruritus in the absence of any lip swelling or tongue swelling.  Not associated with any reported acute resp distress, stridor, difficulty swallowing. VSS, including no evidence of hypotension.  No antibiotics administered for greater than 24 hours, and no overt new medications administered overnight.  ? ?Oral care protocol in place, with potential for hydrophobic topical to optimize barrier in the setting of the patient's cracked lips.  Benadryl 25 mg p.o. x1 dose ordered.  EKG ordered to evaluate QTc interval given a documented history of prior QTc prolongation. ? ? ? ? ?Babs Bertin, DO ?Hospitalist ? ?

## 2022-04-17 NOTE — Progress Notes (Signed)
?[facility] ?Progress Note  ? ?Subjective:   Patient seen and examined at bedside.  Swelling in lip improved from remain painful and blistered.  Ongoing diarrhea which is improving with imodium.  Denies CP, SOB, abdominal pain, nausea and vomiting.  Hopeful to go home today.  ? ?Objective ?Vitals:  ? 04/16/22 2317 04/16/22 2318 04/17/22 2725 04/17/22 0813  ?BP: 123/72  126/64 (!) 120/99  ?Pulse: 88  86 95  ?Resp:  19 12 16   ?Temp: 98.2 ?F (36.8 ?C)  98.1 ?F (36.7 ?C) 98.5 ?F (36.9 ?C)  ?TempSrc: Oral  Oral Oral  ?SpO2: 96%  98%   ?Weight:      ? ?Physical Exam ?General:WDWN female in NAD ?HEENT: perioral blisters ?Heart:RRR, no mrg ?Lungs:CTAB, nml WOB on RA ?Abdomen:soft, NTND ?Extremities:no LE edema ?Dialysis Access: RU AVF +b/t  ? ?Filed Weights  ? 04/14/22 1220 04/16/22 1654 04/16/22 2048  ?Weight: 44.2 kg 47.5 kg (P) 46.5 kg  ? ? ?Intake/Output Summary (Last 24 hours) at 04/17/2022 1041 ?Last data filed at 04/16/2022 2048 ?Gross per 24 hour  ?Intake --  ?Output 1000 ml  ?Net -1000 ml  ? ? ?Additional Objective ?Labs: ?Basic Metabolic Panel: ?Recent Labs  ?Lab 04/13/22 ?0810 04/14/22 ?0139 04/15/22 ?0108 04/16/22 ?0102 04/17/22 ?3664  ?NA 132* 133* 132* 132* 136  ?K 6.2* 4.6 4.0 4.7 3.3*  ?CL 91* 93* 90* 91* 95*  ?CO2 20* 26 27 26 30   ?GLUCOSE 80 90 77 74 99  ?BUN 94* 37* 31* 49* 13  ?CREATININE 14.15* 7.74* 5.70* 8.30* 4.33*  ?CALCIUM 9.0 8.7* 8.9 9.2 8.7*  ?PHOS >30.0* 8.8*  --   --   --   ? ?Liver Function Tests: ?Recent Labs  ?Lab 04/13/22 ?0810  ?AST 12*  ?ALT 11  ?ALKPHOS 63  ?BILITOT 0.7  ?PROT 8.4*  ?ALBUMIN 2.5*  ? ?CBC: ?Recent Labs  ?Lab 04/12/22 ?1534 04/13/22 ?0810 04/14/22 ?0139 04/15/22 ?0108 04/16/22 ?0102 04/17/22 ?4034  ?WBC 5.6 5.4 4.5 3.9* 4.3 3.7*  ?NEUTROABS 3.5  --   --   --  2.4  --   ?HGB 11.3* 10.1* 10.3* 10.4* 10.0* 9.6*  ?HCT 36.4 32.2* 32.5* 34.2* 32.5* 30.8*  ?MCV 88.8 88.2 86.9 88.6 88.1 88.0  ?PLT 266 263 285 252 270 251  ? ?Blood Culture ?   ?Component Value  Date/Time  ? SDES SYNOVIAL FLUID 04/14/2022 1440  ? SPECREQUEST LEFT SHOULDER 04/14/2022 1440  ? CULT  04/14/2022 1440  ?  NO GROWTH 3 DAYS NO ANAEROBES ISOLATED; CULTURE IN PROGRESS FOR 5 DAYS ?Performed at [facility], [place] [address] [place], [place] [address] ?  ? REPTSTATUS PENDING 04/14/2022 1440  ? ?Medications: ? ? carvedilol  12.5 mg Oral Q breakfast  ? Chlorhexidine Gluconate Cloth  6 each Topical Daily  ? darbepoetin (ARANESP) injection - DIALYSIS  40 mcg Intravenous Q Mon-HD  ? doxercalciferol  12 mcg Intravenous Q T,Th,Sa-HD  ? feeding supplement (NEPRO CARB STEADY)  237 mL Oral Once per day on Tue Thu Sat  ? heparin  5,000 Units Subcutaneous Q8H  ? lidocaine  1 patch Transdermal Q24H  ? lidocaine (PF)  2 mL Intradermal Once  ? potassium chloride  40 mEq Oral Once  ? sevelamer carbonate  2,400 mg Oral TID WC  ? ? ?Dialysis Orders: ?TTS at [facility] - last HD 4/12, left at 46.9kg ?3:45hr, 400/A1.5, EDW 46.5kg, 2K/2Ca, AVF, heparin 4000U bolus ?- Hectoral 85mcg IV q HD ?- Mircera 53mcg  IV q 2 wks (last 4/1) ?- Venofer 100mg  x 10 ordered - s/p 6 doses. ?  ?Assessment/Plan: ? L shoulder pain + stiffness/possible septic arthritis: MRI with large effusion/soft tissue swelling concerning for septic arthritis. No recent known illness/bacteremic events. AVF looks fine. Tmax 100.18F during admit. Started on Vanc/Zosyn. Ortho consulted. IR performed joint aspiration 4/18: Cell count >27K, gram stain WBC only, Cx negative (but had been on abx for 2 days at that time). Per ortho, felt not c/w infection. Abx d/c'd 4/20 - also has had reaction with Vancomycin -> NEED TO ADD TO OUTPATIENT ALLERGY LIST. ? ESRD:  Usual TTS schedule. HD tomorrow per regular schedule, can be completed at outpatient center.  ? Hypertension/volume: BP in goal.  No signs of volume overload on exam.  Now below outpatient dry weight, will lower on d/c.  ? Anemia: Hgb 9.6 - continue Aranesp q Monday while here. ? Metabolic bone  disease: Ca ok, Phos initially > 30 on admit -> repeat 8.8, consistent with her usual levels. Continue Renvela as binder/VDRA. ? SLE: No maintenance medication in the past few years. Does not have rheumatologist - can arrange as outpatient. Has had at least 2 episodes of severe joint pain/effusions related to her SLE (hip 11/2021 + elbow 12/2018). At minimum, she should restart Plaquenil. ? HFrEF (EF 30-35% in 10/2020) ? ?Jen Mow, PA-C ?[facility] ?04/17/2022,10:41 AM ? LOS: 5 days  ? ? ?

## 2022-04-17 NOTE — Plan of Care (Signed)
?  Problem: Education: ?Goal: Knowledge of General Education information will improve ?Description: Including pain rating scale, medication(s)/side effects and non-pharmacologic comfort measures ?Outcome: Adequate for Discharge ?  ?Problem: Health Behavior/Discharge Planning: ?Goal: Ability to manage health-related needs will improve ?Outcome: Adequate for Discharge ?  ?Problem: Clinical Measurements: ?Goal: Ability to maintain clinical measurements within normal limits will improve ?Outcome: Adequate for Discharge ?Goal: Will remain free from infection ?Outcome: Adequate for Discharge ?Goal: Diagnostic test results will improve ?Outcome: Adequate for Discharge ?Goal: Respiratory complications will improve ?Outcome: Adequate for Discharge ?Goal: Cardiovascular complication will be avoided ?Outcome: Adequate for Discharge ?  ?Problem: Nutrition: ?Goal: Adequate nutrition will be maintained ?Outcome: Adequate for Discharge ?  ?Problem: Activity: ?Goal: Risk for activity intolerance will decrease ?Outcome: Adequate for Discharge ?  ?Problem: Elimination: ?Goal: Will not experience complications related to bowel motility ?Outcome: Adequate for Discharge ?Goal: Will not experience complications related to urinary retention ?Outcome: Adequate for Discharge ?  ?Problem: Pain Managment: ?Goal: General experience of comfort will improve ?Outcome: Adequate for Discharge ?  ?

## 2022-04-18 ENCOUNTER — Telehealth: Payer: Self-pay | Admitting: Nephrology

## 2022-04-18 NOTE — Telephone Encounter (Signed)
Transition of Care Contact from Inpatient Facility ? ?Date of Discharge: 04/17/22 ?Date of Contact: 04/18/22 ?Method of contact: phone - attempted ? ?Attempted to contact patient to discuss transition of care from inpatient admission.  Patient did not answer the phone.  Will follow up at dialysis.  ? ?Jen Mow, PA-C ?[facility] ?Pager: [phone] ? ?

## 2022-04-19 LAB — AEROBIC/ANAEROBIC CULTURE W GRAM STAIN (SURGICAL/DEEP WOUND): Culture: NO GROWTH

## 2022-04-20 ENCOUNTER — Telehealth: Payer: Self-pay

## 2022-04-20 ENCOUNTER — Emergency Department (HOSPITAL_COMMUNITY)
Admission: EM | Admit: 2022-04-20 | Discharge: 2022-04-20 | Disposition: A | Discharge status: Home / Self Care | Payer: Medicaid Other | Attending: Emergency Medicine | Admitting: Emergency Medicine

## 2022-04-20 ENCOUNTER — Encounter (HOSPITAL_COMMUNITY): Payer: Self-pay

## 2022-04-20 ENCOUNTER — Other Ambulatory Visit: Payer: Self-pay

## 2022-04-20 DIAGNOSIS — N186 End stage renal disease: Secondary | ICD-10-CM | POA: Insufficient documentation

## 2022-04-20 DIAGNOSIS — I5022 Chronic systolic (congestive) heart failure: Secondary | ICD-10-CM | POA: Diagnosis not present

## 2022-04-20 DIAGNOSIS — M25512 Pain in left shoulder: Secondary | ICD-10-CM | POA: Diagnosis present

## 2022-04-20 DIAGNOSIS — Z992 Dependence on renal dialysis: Secondary | ICD-10-CM | POA: Diagnosis not present

## 2022-04-20 MED ORDER — METHOCARBAMOL 500 MG PO TABS: 500.0000 mg | ORAL_TABLET | Freq: Four times a day (QID) | ORAL | 0 refills | Status: DC | PRN

## 2022-04-20 MED ORDER — DIAZEPAM 5 MG/ML IJ SOLN
2.5000 mg | Freq: Once | INTRAMUSCULAR | Status: AC
  Administered 2022-04-20: 2.5 mg via INTRAMUSCULAR
  Filled 2022-04-20: qty 2

## 2022-04-20 NOTE — ED Triage Notes (Signed)
Pt arrived POV from home c/o left shoulder pain. Pt was seen and discharged for the same on Friday. Pt states they drew fluid off and gave her an antibiotic but it still hurts.  ?

## 2022-04-20 NOTE — Discharge Instructions (Signed)
You were seen in the emergency room today with shoulder pain.  Please follow-up with the orthopedist either referred during the last admission or the orthopedist listed here.  The muscle relaxer may cause some drowsiness and you cannot drive a car or do other important tasks while taking this. ?

## 2022-04-20 NOTE — ED Provider Notes (Signed)
? ?Emergency Department Provider Note ? ? ?I have reviewed the triage vital signs and the nursing notes. ? ? ?HISTORY ? ?Chief Complaint ?Shoulder Pain ? ? ?HPI ?[name] is a 41 y.o. female with past medical history reviewed below presents emergency department with left shoulder pain which is returned.  Patient had an admission this month with concern for possible septic joint.  This was ruled out with aspiration of fluid and cultures.  She returned home and has been using her lidocaine ointment but symptoms have worsened.  She denies any pain into the neck, elbow, chest/back.  No numbness/weakness in the extremity.  No additional fevers.  She is been compliant with dialysis.  ? ? ?Past Medical History:  ?Diagnosis Date  ? Anemia   ? low iron - receives iron at dialysis  ? Anxiety   ? Arthritis   ? RA  ? Chronic systolic congestive heart failure ([place]) 03/16/2016  ? Dyspnea   ? ESRD (end stage renal disease) ([place])   ? Hemo [facility]TH[facility]AT _ [place]  ? H/O pericarditis 01/17/2013  ? H/O pleural effusion 01/17/2013  ? Heart murmur   ? Lupus (systemic lupus erythematosus) ([place])   ? Previously followed with Dr. Charlestine Night, has not followed up recently  ? 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 Renal biopsy shows segmental endocapillary proliferation and cellular crescent formation (Class IIIA) and lupus membranous glomerulopathy (Class V, stage II)  ? Pneumonia   ? many times  ? Polysubstance abuse ([place])   ? cocaine, MJ, tobacco  ? S/P pericardiocentesis 01/17/2013  ? H/o pericardial effusion with tamponade 2006   ? Seizures ([place])   ? during pregnancy 1 time  ? Streptococcal bacteremia 01/23/2013  ? She had two S. pneumonae bacteremia on 01/21/2013. Sensitive to Peniccilin   ? ? ?Review of Systems ? ?Constitutional: No fever/chills ?Eyes: No visual changes. ?ENT: No sore throat. ?Cardiovascular: Denies chest pain. ?Respiratory: Denies shortness of breath. ?Gastrointestinal: No abdominal pain.  No nausea, no vomiting.  No diarrhea.   No constipation. ?Genitourinary: Negative for dysuria. ?Musculoskeletal: Positive left shoulder pain.  ?Skin: Negative for rash. ?Neurological: Negative for headaches, focal weakness or numbness. ? ? ?____________________________________________ ? ? ?PHYSICAL EXAM: ? ?VITAL SIGNS: ?ED Triage Vitals  ?Enc Vitals Group  ?   BP 04/20/22 0548 (!) 166/73  ?   Pulse Rate 04/20/22 0548 75  ?   Resp 04/20/22 0548 16  ?   Temp 04/20/22 0548 97.7 ?F (36.5 ?C)  ?   Temp Source 04/20/22 0548 Oral  ?   SpO2 04/20/22 0548 96 %  ?   Weight 04/20/22 0546 94 lb 12.8 oz (43 kg)  ?   Height 04/20/22 0546 5\' 4"  (1.626 m)  ? ?Constitutional: Alert and oriented. Well appearing and in no acute distress. ?Eyes: Conjunctivae are normal. ?Head: Atraumatic. ?Nose: No congestion/rhinnorhea. ?Mouth/Throat: Mucous membranes are moist.  ?Neck: No stridor.   ?Cardiovascular: Normal rate, regular rhythm. Good peripheral circulation. Grossly normal heart sounds.   ?Respiratory: Normal respiratory effort.  No retractions. Lungs CTAB. ?Gastrointestinal: Soft and nontender. No distention.  ?Musculoskeletal: No lower extremity tenderness nor edema. No gross deformities of extremities. Normal ROM of the left shoulder. No warmth/redness of the joint.  ?Neurologic:  Normal speech and language. No gross focal neurologic deficits are appreciated.  ?Skin:  Skin is warm, dry and intact. No rash noted. ? ?____________________________________________ ? ? ?PROCEDURES ? ?Procedure(s) performed:  ? ?Procedures ? ?None  ?____________________________________________ ? ? ?INITIAL IMPRESSION /  ASSESSMENT AND PLAN / ED COURSE ? ?Pertinent labs & imaging results that were available during my care of the patient were reviewed by me and considered in my medical decision making (see chart for details). ?  ? ?Medical Decision Making: Summary:  ?Patient presents emergency department with atraumatic pain to the left shoulder.  Normal range of motion although pain with doing  so.  No warmth/redness.  The concern for septic joint was ruled out during her recent admission.  I do not see indication for additional imaging or joint aspiration.  Patient is afebrile here.  Plan for muscle relaxer and orthopedic follow-up.  Patient was given contact information for orthopedist at discharge and plans for close follow up.  ? ?Reevaluation with update and discussion with patient who is in agreement with plan at discharge ? ?Disposition: discharge ? ?____________________________________________ ? ?FINAL CLINICAL IMPRESSION(S) / ED DIAGNOSES ? ?Final diagnoses:  ?Acute pain of left shoulder  ? ? ? ?NEW OUTPATIENT MEDICATIONS STARTED DURING THIS VISIT: ? ?New Prescriptions  ? METHOCARBAMOL (ROBAXIN) 500 MG TABLET    Take 1 tablet (500 mg total) by mouth every 6 (six) hours as needed for muscle spasms.  ? ? ?Note:  This document was prepared using Dragon voice recognition software and may include unintentional dictation errors. ? ?Nanda Quinton, MD, FACEP ?Emergency Medicine ? ?  ?Margette Fast, MD ?04/20/22 [phone] ? ?

## 2022-04-20 NOTE — Telephone Encounter (Signed)
Transition Care Management Unsuccessful Follow-up Telephone Call ? ?Date of discharge and from where:  04/17/2022, [facility].  She was also seen this morning in the ED ? ?Attempts:  2nd Attempt ? ?Reason for unsuccessful TCM follow-up call:  Left voice message # [phone],  this is also the phone number for patient's mother, Hassan Rowan.   ? ?Patient has a follow up appointment scheduled with Durene Fruits, NP on 04/29/2022.  ? ? ? ?

## 2022-04-21 ENCOUNTER — Telehealth: Payer: Self-pay

## 2022-04-21 NOTE — Telephone Encounter (Signed)
Transition Care Management Unsuccessful Follow-up Telephone Call ? ?Date of discharge and from where:  04/17/2022, [facility].  She was also seen in ED at [facility] 04/20/2022.  ? ?Attempts:  2nd Attempt ? ?Reason for unsuccessful TCM follow-up call:  Left voice message on # [phone],  this is also the phone number for patient's mother, Hassan Rowan.   ?  ?Patient has a follow up appointment scheduled with Durene Fruits, NP on 04/29/2022.  ?   ? ? ? ?

## 2022-04-22 ENCOUNTER — Telehealth: Payer: Self-pay

## 2022-04-22 NOTE — Telephone Encounter (Signed)
Transition Care Management Unsuccessful Follow-up Telephone Call ? ?Date of discharge and from where:  04/17/2022, [facility].  She was also seen in ED at [facility] 04/20/2022.  ? ?Attempts:  3rd Attempt ? ?Reason for unsuccessful TCM follow-up call:  Left voice message  on # [phone],  this is also the phone number for patient's mother, Hassan Rowan.   ?  ?Patient has a follow up appointment scheduled with Durene Fruits, NP on 04/29/2022.  ?    ? ? ? ?

## 2022-04-24 NOTE — Progress Notes (Signed)
? ? ?Patient ID: [name], female    DOB: Apr 08, 1981  MRN: [id number] ? ?CC: Hospital Discharge Follow-Up ? ?Subjective: ?[name] is a 41 y.o. female who presents for hospital discharge follow-up. ? ?Her concerns today include:  ?Hospital discharge follow-up: ?04/12/2022 - 04/17/2022 [facility] per MD note: ?Recommendations at discharge:  ?  ?Follow-up with primary care physician in 1 week.   ?Check CBC, BMP, LFTs, magnesium in the next visit. ?Would recommend a referral to  rheumatology as outpatient.  She did have left shoulder joint effusion without infection.  ESR and CRP was significantly elevated on presentation.  Has clinically improved at this time but would definitely benefit from a rheumatology follow-up. ?Follow-up with [facility] in 2 weeks for shoulder pain follow up. ?  ?Discharge Diagnoses: ?Principal Problem: ?  Acute febrile illness ?Active Problems: ?  Essential hypertension ?  ESRD on dialysis [facility]) ?  Left shoulder pain ?  Hypertensive urgency ?  GERD (gastroesophageal reflux disease) ?  HFrEF (heart failure with reduced ejection fraction) ([place]) ?  Elevated troponin ?  SIRS (systemic inflammatory response syndrome) (HCC) ?  ?Resolved Problems: ?  * No resolved hospital problems. * ?  ?Hospital Course: ?Patient is a 41 years old female with past medical history of end-stage renal disease on hemodialysis, Tuesday Thursday Saturday, left breast Referral heart failure with reduced ejection fraction EF of 30 to 35% presented hospital with left arm pain difficulty moving due to pain.  In the ED, patient was noted to be febrile with a temperature of 100.9 ?F and tachypnea tachycardia.  Labs showed hyponatremia with elevated creatinine at 12.7 and mildly elevated troponins.  CRP of 7.7, ESR of 121 and lactate was 1.7.  Left upper extremity ultrasound showed no evidence of DVT.  X-ray of the shoulder without any acute abnormality.  Chest x-ray showed cardiomegaly and small  pleural effusions.  Patient was then admitted hospital for further evaluation and treatment. ?  ?Assessment and plan ?  ?Principal Problem: ?  Acute febrile illness ?Active Problems: ?  Essential hypertension ?  ESRD on dialysis [facility]) ?  Left shoulder pain ?  Hypertensive urgency ?  GERD (gastroesophageal reflux disease) ?  HFrEF (heart failure with reduced ejection fraction) ([place]) ?  Elevated troponin ?  SIRS (systemic inflammatory response syndrome) (HCC) ?  ?SIRS  ?Unclear etiology at this time.  Patient did have a left shoulder joint effusion which was aspirated but no obvious signs of infection at this time.  Blood cultures negative in 5 days.  Patient was initially empirically on vancomycin and Zosyn.  Antibiotics were subsequently discontinued since Gram stain was negative and culture was negative from the joint fluid.   ?  ?Left shoulder pain with joint effusion.  Initial concern for septic arthritis.  Septic arthritis has been ruled out at this time.  Patient did have a joint effusion which improved after aspiration.  Range of movement and pain has significantly improved at this time.  MRI of the left shoulder showed prominent soft tissue swelling with large glenohumeral joint effusion concerning for septic arthritis.  Status post fluoroscopic guided joint aspiration of the left shoulder.  Patient was initially on antibiotic which has been discontinued.  Patient continues to improve clinically and is afebrile at this time.  No leukocytosis.  Patient was advised to follow-up with rheumatologist as outpatient since this could be manifestation of lupus flare.. ?  ?Hypokalemia. ?Replenished with potassium prior to discharge.  Could be  adjusted with hemodialysis if necessary. ? ?Hypertensive urgency- ?On presentation.  Continue  Coreg with holding parameters.  Blood pressure is controlled at this time. ? ?Elevated troponin possibly secondary to type II demand ischemia. Troponin 89 > 88;  flat troponins.  No  chest pain.  No further work-up was pursued. ? ?GERD ?Continue Protonix ? ?History of HFrEF ?Continue Coreg.  Volume managed by hemodialysis. ?  ?Anemia of chronic kidney disease/chronic disease.   ?Patient is on weekly Aranesp.  Nephrology to follow as outpatient. ? ?ESRD on HD ([facility]) ?Received hemodialysis during hospitalization.  We will continue on discharge ?  ?History of systemic lupus erythematosus.  Not on any maintenance medication at this time.  States that she was in remission but has not had a rheumatologist as outpatient.  She had followed up with PCP.  I have encouraged her to follow-up with her rheumatologist as outpatient.  CRP and sed rate was elevated on presentation. ?  ?Consultants:  ?Orthopedics, ? nephrology ?  ?Procedures performed:  ?Hemodialysis ?Fluoroscopic guided needle aspiration of the left shoulder joint on 04/14/2022 by IR  ? ?Follow-Ups ?Follow up with Buford Dresser, MD (Cardiology) ?Follow up with Nicholes Stairs, MD (Orthopedic Surgery) in 2 weeks (05/01/2022); for shoulder follow up, As needed ? ?Today's visit 04/29/2022: ?Left shoulder pain follow-up:   ?History of systemic lupus erythematosus follow-up: ?Still having discomfort. Unable to raise above head. Robaxin helping and requesting refills. Reports her hemodialysis provider has referred her to Orthopedics. Still needs referral to Rheumatology. ? ?Hypertensive urgency follow-up: ?History of HFrEF follow-up: ?Doing well on Coreg. Reports plans to schedule appointment with Buford Dresser, MD at Cardiology soon and has contact information.  ? ?GERD follow-up: ?Reports no longer taking Protonix and symptoms resolved.  ? ? ?2. Emergency department follow-up: ?04/20/2022 [facility] per MD note: ?  ?Medical Decision Making: Summary:  ?Patient presents emergency department with atraumatic pain to the left shoulder.  Normal range of motion although pain with doing so.  No warmth/redness.  The concern for  septic joint was ruled out during her recent admission.  I do not see indication for additional imaging or joint aspiration.  Patient is afebrile here.  Plan for muscle relaxer and orthopedic follow-up.  Patient was given contact information for orthopedist at discharge and plans for close follow up.  ?  ?Today's visit 04/29/2022: ?Still having discomfort. Unable to raise above head. Robaxin helping and requesting refills. Reports her hemodialysis provider has referred her to Orthopedics. Still needs referral to Rheumatology. ? ? ?Patient Active Problem List  ? Diagnosis Date Noted  ? Acute febrile illness 04/12/2022  ? Hypertensive urgency 04/12/2022  ? GERD (gastroesophageal reflux disease) 04/12/2022  ? HFrEF (heart failure with reduced ejection fraction) ([place]) 04/12/2022  ? Elevated troponin 04/12/2022  ? SIRS (systemic inflammatory response syndrome) ([place]) 04/12/2022  ? Hyperkalemia, diminished renal excretion 12/15/2021  ? Trichomonas vaginitis 03/25/2021  ? Bacterial vaginitis 03/25/2021  ? Encounter for screening for COVID-19 01/24/2021  ? Hemodialysis access, fistula mature ([place]) 01/22/2021  ? Allergic reaction 01/22/2021  ? Acute respiratory failure with hypoxia ([place]) 11/02/2020  ? Acute hyperkalemia 07/28/2020  ? Nicotine dependence, cigarettes, uncomplicated 33/29/5188  ? Essential hypertension 07/28/2020  ? Polysubstance abuse ([place]) 07/28/2020  ? Hemodialysis catheter malfunction ([place]) 07/28/2020  ? Complication of AV dialysis fistula, sequela 07/28/2020  ? 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[place]) 05/18/2020  ? Bacterial intestinal infection, unspecified 12/14/2019  ? Left shoulder pain 08/15/2019  ? Cellulitis, unspecified 05/02/2019  ?  Hyperkalemia 07/29/2018  ? SVT (supraventricular tachycardia) ([place]) 07/16/2018  ? Cardiomyopathy, unspecified ([place]) 12/03/2016  ? Diarrhea, unspecified 04/18/2016  ? Chronic combined systolic (congestive) and diastolic (congestive) heart failure ([place]) 03/16/2016  ? Other hypervolemia    ? S/P thoracentesis   ? Cough with hemoptysis   ? Myalgia   ? Pulmonary edema   ? Dependence on renal dialysis ([place]) 10/15/2015  ? Rash and nonspecific skin eruption 06/26/2015  ? Secondary Raynaud's phenomenon 0

## 2022-04-29 ENCOUNTER — Encounter: Payer: Self-pay | Admitting: Family

## 2022-04-29 ENCOUNTER — Ambulatory Visit (INDEPENDENT_AMBULATORY_CARE_PROVIDER_SITE_OTHER): Payer: Medicaid Other | Admitting: Family

## 2022-04-29 VITALS — BP 125/79 | HR 100 | Temp 98.3°F | Resp 16 | Ht 62.99 in | Wt 102.4 lb

## 2022-04-29 DIAGNOSIS — I10 Essential (primary) hypertension: Secondary | ICD-10-CM

## 2022-04-29 DIAGNOSIS — R778 Other specified abnormalities of plasma proteins: Secondary | ICD-10-CM

## 2022-04-29 DIAGNOSIS — I160 Hypertensive urgency: Secondary | ICD-10-CM

## 2022-04-29 DIAGNOSIS — M25512 Pain in left shoulder: Secondary | ICD-10-CM

## 2022-04-29 DIAGNOSIS — Z09 Encounter for follow-up examination after completed treatment for conditions other than malignant neoplasm: Secondary | ICD-10-CM | POA: Diagnosis not present

## 2022-04-29 DIAGNOSIS — Z992 Dependence on renal dialysis: Secondary | ICD-10-CM

## 2022-04-29 DIAGNOSIS — M329 Systemic lupus erythematosus, unspecified: Secondary | ICD-10-CM

## 2022-04-29 DIAGNOSIS — I5020 Unspecified systolic (congestive) heart failure: Secondary | ICD-10-CM

## 2022-04-29 DIAGNOSIS — R6510 Systemic inflammatory response syndrome (SIRS) of non-infectious origin without acute organ dysfunction: Secondary | ICD-10-CM

## 2022-04-29 DIAGNOSIS — R509 Fever, unspecified: Secondary | ICD-10-CM

## 2022-04-29 DIAGNOSIS — N186 End stage renal disease: Secondary | ICD-10-CM

## 2022-04-29 DIAGNOSIS — K219 Gastro-esophageal reflux disease without esophagitis: Secondary | ICD-10-CM

## 2022-04-29 MED ORDER — METHOCARBAMOL 500 MG PO TABS: 500.0000 mg | ORAL_TABLET | Freq: Four times a day (QID) | ORAL | 2 refills | Status: DC | PRN

## 2022-04-29 NOTE — Progress Notes (Signed)
Pt presents for hospitalization follow-up  ?

## 2022-07-23 NOTE — Progress Notes (Unsigned)
Established Dialysis Access   History of Present Illness   [name] is a 41 y.o. ([birth year]) female who presents for re-evaluation for permanent access.  The patient is *** hand dominant.  Previous access procedures have been completed in the *** arm.  The patient's complication from previous access procedures include: ***.  The patient has *** had a previous PPM placed.  The patient's PMH, PSH, SH, and FamHx were reviewed on *** are unchanged from ***.  Current Outpatient Medications  Medication Sig Dispense Refill   acetaminophen (TYLENOL) 650 MG CR tablet Take 1,300 mg by mouth every 8 (eight) hours as needed for pain.     carvedilol (COREG) 12.5 MG tablet Take 12.5 mg by mouth daily.     diphenhydrAMINE (BENADRYL) 25 MG tablet Take 1 tablet (25 mg total) by mouth every 4 (four) hours as needed for itching or allergies. (Patient taking differently: Take 25 mg by mouth 2 (two) times daily.) 30 tablet 0   doxercalciferol (HECTOROL) 4 MCG/2ML injection Use as recommended by nephrology (Patient taking differently: 4 mcg Every Tuesday,Thursday,and Saturday with dialysis. Use as recommended by nephrology) 2 mL    hydrOXYzine (ATARAX/VISTARIL) 10 MG tablet Take 1 tablet (10 mg total) by mouth 3 (three) times daily as needed for anxiety. (Patient not taking: Reported on 12/15/2021) 5 tablet 0   loperamide (IMODIUM A-D) 2 MG tablet Take 4 mg by mouth daily as needed for diarrhea or loose stools. Dialysis days     methocarbamol (ROBAXIN) 500 MG tablet Take 1 tablet (500 mg total) by mouth every 6 (six) hours as needed for muscle spasms. 30 tablet 2   multivitamin (RENA-VIT) TABS tablet Take 1 tablet by mouth daily.     Nutritional Supplements (NOVASOURCE RENAL) LIQD Take 237 mLs by mouth See admin instructions. Take 1 container (237 mls) by at bedtime on dialysis days (Tuesday, Thursday, Saturday)     ondansetron (ZOFRAN-ODT) 4 MG disintegrating tablet Take 1 tablet (4 mg total) by mouth  every 8 (eight) hours as needed for nausea or vomiting. 20 tablet 0   pantoprazole (PROTONIX) 40 MG tablet Take 1 tablet (40 mg total) by mouth 2 (two) times daily before a meal. (Patient not taking: Reported on 04/12/2022) 60 tablet 0   sevelamer carbonate (RENVELA) 800 MG tablet Take 2,400 mg by mouth 3 (three) times daily.     Current Facility-Administered Medications  Medication Dose Route Frequency Provider Last Rate Last Admin   0.9 %  sodium chloride infusion  250 mL Intravenous PRN Serafina Mitchell, MD       sodium chloride flush (NS) 0.9 % injection 3 mL  3 mL Intravenous Q12H Serafina Mitchell, MD       sodium chloride flush (NS) 0.9 % injection 3 mL  3 mL Intravenous PRN Serafina Mitchell, MD        On ROS today: ***, ***   Physical Examination  There were no vitals filed for this visit. There is no height or weight on file to calculate BMI.  General {LOC:19197::"Somulent","Alert"}, {Orientation:19197::"Confused","O x 3"}, {Weight:19197::"Obese","Cachectic","WD"}, {General state of health:19197::"Ill appearing","Elderly","NAD"}  Pulmonary {Chest wall:19197::"Asx chest movement","Sym exp"}, {Air movt:19197::"Decreased *** air movt","good B air movt"}, {BS:19197::"rales on ***","rhonchi on ***","wheezing on ***","CTA B"}  Cardiac {Rhythm:19197::"Irregularly, irregular rate and rhythm","RRR, Nl S1, S2"}, {Murmur:19197::"Murmur present: ***","no Murmurs"}, {Rubs:19197::"Rub present: ***","No rubs"}, {Gallop:19197::"Gallop present: ***","No S3,S4"}  Vascular Vessel Right Left  Radial {Palpable:19197::"Not palpable","Faintly palpable","Palpable"} {Palpable:19197::"Not palpable","Faintly palpable","Palpable"}  Brachial {Palpable:19197::"Not palpable","Faintly palpable","Palpable"} {  Palpable:19197::"Not palpable","Faintly palpable","Palpable"}  Ulnar {Palpable:19197::"Not palpable","Faintly palpable","Palpable"} {Palpable:19197::"Not palpable","Faintly palpable","Palpable"}     Musculo- skeletal M/S 5/5 throughout {MS:19197::"except ***"," "}, Extremities without ischemic changes {MS:19197::"except ***"," "}  Neurologic A&O; CN grossly intact     Non-invasive Vascular Imaging   {side of body:30421359} Arm Access Duplex  (***):  Diameters:  *** mm Depth:  *** mm PSV:  *** c/s  BUE Doppler (***):  R arm:  Brachial: {Signals:19197::"none","mono","bi","tri"}, *** mm Radial: {Signals:19197::"none","mono","bi","tri"}, *** mm Ulnar: {Signals:19197::"none","mono","bi","tri"}, *** mm L arm:  Brachial: {Signals:19197::"none","mono","bi","tri"}, *** mm Radial: {Signals:19197::"none","mono","bi","tri"}, *** mm Ulnar: {Signals:19197::"none","mono","bi","tri"}, *** mm  BUE Vein Mapping  (***):  R arm: acceptable vein conduits include *** L arm: acceptable vein conduits include ***    Medical Decision Making   [name] is a 41 y.o. female who presents with {KidneyDisease:19197::"ESRD","chronic kidney disease stage ***"} requiring hemodialysis.   ***   Karoline Caldwell, PA-C [facility] Office: [phone]  Clinic MD: Virl Cagey

## 2022-07-24 ENCOUNTER — Ambulatory Visit: Payer: Medicaid Other

## 2022-07-24 DIAGNOSIS — N186 End stage renal disease: Secondary | ICD-10-CM

## 2022-07-30 ENCOUNTER — Ambulatory Visit: Payer: Medicaid Other | Admitting: Vascular Surgery

## 2022-08-03 ENCOUNTER — Ambulatory Visit: Payer: Medicaid Other

## 2022-08-21 ENCOUNTER — Ambulatory Visit: Payer: Medicaid Other

## 2022-11-05 ENCOUNTER — Emergency Department (HOSPITAL_COMMUNITY)
Admission: EM | Admit: 2022-11-05 | Discharge: 2022-11-05 | Discharge status: Against Medical Advice | Payer: Medicaid Other | Attending: Emergency Medicine | Admitting: Emergency Medicine

## 2022-11-05 ENCOUNTER — Encounter (HOSPITAL_COMMUNITY): Payer: Self-pay | Admitting: *Deleted

## 2022-11-05 ENCOUNTER — Other Ambulatory Visit: Payer: Self-pay

## 2022-11-05 DIAGNOSIS — Z5321 Procedure and treatment not carried out due to patient leaving prior to being seen by health care provider: Secondary | ICD-10-CM | POA: Insufficient documentation

## 2022-11-05 DIAGNOSIS — R519 Headache, unspecified: Secondary | ICD-10-CM | POA: Insufficient documentation

## 2022-11-05 LAB — COMPREHENSIVE METABOLIC PANEL
ALT: 17 U/L (ref 0–44)
AST: 19 U/L (ref 15–41)
Albumin: 2.7 g/dL — ABNORMAL LOW (ref 3.5–5.0)
Alkaline Phosphatase: 109 U/L (ref 38–126)
Anion gap: 11 (ref 5–15)
BUN: 25 mg/dL — ABNORMAL HIGH (ref 6–20)
CO2: 30 mmol/L (ref 22–32)
Calcium: 9.9 mg/dL (ref 8.9–10.3)
Chloride: 95 mmol/L — ABNORMAL LOW (ref 98–111)
Creatinine, Ser: 6.3 mg/dL — ABNORMAL HIGH (ref 0.44–1.00)
GFR, Estimated: 8 mL/min — ABNORMAL LOW (ref >60)
Glucose, Bld: 86 mg/dL (ref 70–99)
Potassium: 3.3 mmol/L — ABNORMAL LOW (ref 3.5–5.1)
Sodium: 136 mmol/L (ref 135–145)
Total Bilirubin: 0.3 mg/dL (ref 0.3–1.2)
Total Protein: 7.9 g/dL (ref 6.5–8.1)

## 2022-11-05 LAB — I-STAT BETA HCG BLOOD, ED (MC, WL, AP ONLY): I-stat hCG, quantitative: <5 m[IU]/mL (ref <5)

## 2022-11-05 LAB — CBC WITH DIFFERENTIAL/PLATELET
Abs Immature Granulocytes: 0.01 10*3/uL (ref 0.00–0.07)
Basophils Absolute: 0 10*3/uL (ref 0.0–0.1)
Basophils Relative: 0 %
Eosinophils Absolute: 0 10*3/uL (ref 0.0–0.5)
Eosinophils Relative: 1 %
HCT: 33.5 % — ABNORMAL LOW (ref 36.0–46.0)
Hemoglobin: 11 g/dL — ABNORMAL LOW (ref 12.0–15.0)
Immature Granulocytes: 0 %
Lymphocytes Relative: 22 %
Lymphs Abs: 1 10*3/uL (ref 0.7–4.0)
MCH: 29.9 pg (ref 26.0–34.0)
MCHC: 32.8 g/dL (ref 30.0–36.0)
MCV: 91 fL (ref 80.0–100.0)
Monocytes Absolute: 0.5 10*3/uL (ref 0.1–1.0)
Monocytes Relative: 11 %
Neutro Abs: 3 10*3/uL (ref 1.7–7.7)
Neutrophils Relative %: 66 %
Platelets: 259 10*3/uL (ref 150–400)
RBC: 3.68 MIL/uL — ABNORMAL LOW (ref 3.87–5.11)
RDW: 15.5 % (ref 11.5–15.5)
WBC: 4.6 10*3/uL (ref 4.0–10.5)
nRBC: 0 % (ref 0.0–0.2)

## 2022-11-05 NOTE — ED Provider Triage Note (Signed)
Emergency Medicine Provider Triage Evaluation Note  [name] , a 41 y.o. female  was evaluated in triage.  Pt complains of headache.  Patient states that she has had a headache since dialysis session this past Tuesday after it finished.  Denies history of headaches after dialysis session.  She notes gradual onset of headache that is worsened since onset.  She has taken Tylenol for symptoms but has not helped.  Reports going to dialysis for 2 hours today and was given Tylenol and sent to emergency department due to worsening headache.  She reports a recent dry weight change and is concerned about association.  Denies fever, visual disturbance, gait abnormality, slurred speech, facial droop, weakness or sensory deficits, history of DVT/PE.  Review of Systems  Positive:  Negative: See above  Physical Exam  BP 120/68 (BP Location: Left Arm)   Pulse 95   Temp 98.5 F (36.9 C) (Oral)   Resp 20   Ht 5\' 5"  (1.651 m)   Wt 48 kg   SpO2 96%   BMI 17.61 kg/m  Gen:   Awake, no distress   Resp:  Normal effort MSK:   Moves extremities without difficulty  Other:  Cranial nerve 3 through 12 grossly intact.  Medical Decision Making  Medically screening exam initiated at 9:55 AM.  Appropriate orders placed.  [name] was informed that the remainder of the evaluation will be completed by another provider, this initial triage assessment does not replace that evaluation, and the importance of remaining in the ED until their evaluation is complete.     Wilnette Kales, [place] 11/05/22 1006

## 2022-11-05 NOTE — ED Notes (Signed)
Pt called multiple times for a room, no answer 

## 2022-11-05 NOTE — ED Triage Notes (Addendum)
Dialysis patient was at dialysis today and only 2 hours of her treatment because of her headache, denies n/v/d. States she get a headache everytime she is on dialysis , the headache goes away after about 1 hour post dialysis

## 2022-11-30 ENCOUNTER — Inpatient Hospital Stay (HOSPITAL_COMMUNITY)
Admission: EM | Admit: 2022-11-30 | Discharge: 2022-12-06 | DRG: 291 | Disposition: A | Discharge status: Home / Self Care | Payer: Medicaid Other | Attending: Internal Medicine | Admitting: Internal Medicine

## 2022-11-30 ENCOUNTER — Inpatient Hospital Stay (HOSPITAL_COMMUNITY): Payer: Medicaid Other

## 2022-11-30 ENCOUNTER — Emergency Department (HOSPITAL_COMMUNITY): Payer: Medicaid Other

## 2022-11-30 ENCOUNTER — Encounter (HOSPITAL_COMMUNITY): Payer: Self-pay | Admitting: Emergency Medicine

## 2022-11-30 ENCOUNTER — Other Ambulatory Visit: Payer: Self-pay

## 2022-11-30 DIAGNOSIS — E872 Acidosis, unspecified: Secondary | ICD-10-CM | POA: Diagnosis present

## 2022-11-30 DIAGNOSIS — L989 Disorder of the skin and subcutaneous tissue, unspecified: Secondary | ICD-10-CM | POA: Diagnosis present

## 2022-11-30 DIAGNOSIS — Z992 Dependence on renal dialysis: Secondary | ICD-10-CM

## 2022-11-30 DIAGNOSIS — F1490 Cocaine use, unspecified, uncomplicated: Secondary | ICD-10-CM | POA: Diagnosis present

## 2022-11-30 DIAGNOSIS — Z79899 Other long term (current) drug therapy: Secondary | ICD-10-CM

## 2022-11-30 DIAGNOSIS — I083 Combined rheumatic disorders of mitral, aortic and tricuspid valves: Secondary | ICD-10-CM | POA: Diagnosis present

## 2022-11-30 DIAGNOSIS — F419 Anxiety disorder, unspecified: Secondary | ICD-10-CM | POA: Diagnosis present

## 2022-11-30 DIAGNOSIS — N2581 Secondary hyperparathyroidism of renal origin: Secondary | ICD-10-CM | POA: Diagnosis present

## 2022-11-30 DIAGNOSIS — Z91119 Patient's noncompliance with dietary regimen due to unspecified reason: Secondary | ICD-10-CM | POA: Diagnosis not present

## 2022-11-30 DIAGNOSIS — M3214 Glomerular disease in systemic lupus erythematosus: Secondary | ICD-10-CM | POA: Diagnosis present

## 2022-11-30 DIAGNOSIS — N186 End stage renal disease: Secondary | ICD-10-CM | POA: Diagnosis present

## 2022-11-30 DIAGNOSIS — I5023 Acute on chronic systolic (congestive) heart failure: Secondary | ICD-10-CM | POA: Diagnosis present

## 2022-11-30 DIAGNOSIS — J90 Pleural effusion, not elsewhere classified: Secondary | ICD-10-CM

## 2022-11-30 DIAGNOSIS — G9341 Metabolic encephalopathy: Secondary | ICD-10-CM | POA: Diagnosis not present

## 2022-11-30 DIAGNOSIS — I132 Hypertensive heart and chronic kidney disease with heart failure and with stage 5 chronic kidney disease, or end stage renal disease: Principal | ICD-10-CM | POA: Diagnosis present

## 2022-11-30 DIAGNOSIS — R569 Unspecified convulsions: Secondary | ICD-10-CM | POA: Diagnosis not present

## 2022-11-30 DIAGNOSIS — E162 Hypoglycemia, unspecified: Secondary | ICD-10-CM | POA: Diagnosis not present

## 2022-11-30 DIAGNOSIS — R64 Cachexia: Secondary | ICD-10-CM | POA: Diagnosis present

## 2022-11-30 DIAGNOSIS — I351 Nonrheumatic aortic (valve) insufficiency: Secondary | ICD-10-CM | POA: Diagnosis not present

## 2022-11-30 DIAGNOSIS — W19XXXA Unspecified fall, initial encounter: Secondary | ICD-10-CM | POA: Diagnosis not present

## 2022-11-30 DIAGNOSIS — Z91199 Patient's noncompliance with other medical treatment and regimen due to unspecified reason: Secondary | ICD-10-CM

## 2022-11-30 DIAGNOSIS — G935 Compression of brain: Secondary | ICD-10-CM | POA: Diagnosis not present

## 2022-11-30 DIAGNOSIS — I340 Nonrheumatic mitral (valve) insufficiency: Secondary | ICD-10-CM

## 2022-11-30 DIAGNOSIS — Z681 Body mass index (BMI) 19 or less, adult: Secondary | ICD-10-CM | POA: Diagnosis not present

## 2022-11-30 DIAGNOSIS — E875 Hyperkalemia: Secondary | ICD-10-CM | POA: Diagnosis not present

## 2022-11-30 DIAGNOSIS — Y92239 Unspecified place in hospital as the place of occurrence of the external cause: Secondary | ICD-10-CM | POA: Diagnosis not present

## 2022-11-30 DIAGNOSIS — F17210 Nicotine dependence, cigarettes, uncomplicated: Secondary | ICD-10-CM | POA: Diagnosis present

## 2022-11-30 DIAGNOSIS — R0602 Shortness of breath: Secondary | ICD-10-CM | POA: Diagnosis present

## 2022-11-30 DIAGNOSIS — J9601 Acute respiratory failure with hypoxia: Secondary | ICD-10-CM | POA: Diagnosis present

## 2022-11-30 DIAGNOSIS — E11649 Type 2 diabetes mellitus with hypoglycemia without coma: Secondary | ICD-10-CM | POA: Diagnosis not present

## 2022-11-30 DIAGNOSIS — E43 Unspecified severe protein-calorie malnutrition: Secondary | ICD-10-CM | POA: Diagnosis present

## 2022-11-30 DIAGNOSIS — Z888 Allergy status to other drugs, medicaments and biological substances status: Secondary | ICD-10-CM

## 2022-11-30 DIAGNOSIS — S065XAA Traumatic subdural hemorrhage with loss of consciousness status unknown, initial encounter: Secondary | ICD-10-CM | POA: Diagnosis not present

## 2022-11-30 DIAGNOSIS — M898X9 Other specified disorders of bone, unspecified site: Secondary | ICD-10-CM | POA: Diagnosis present

## 2022-11-30 DIAGNOSIS — I5022 Chronic systolic (congestive) heart failure: Secondary | ICD-10-CM | POA: Diagnosis present

## 2022-11-30 DIAGNOSIS — D631 Anemia in chronic kidney disease: Secondary | ICD-10-CM | POA: Diagnosis present

## 2022-11-30 DIAGNOSIS — I509 Heart failure, unspecified: Secondary | ICD-10-CM

## 2022-11-30 DIAGNOSIS — I428 Other cardiomyopathies: Secondary | ICD-10-CM | POA: Diagnosis not present

## 2022-11-30 LAB — BASIC METABOLIC PANEL
Anion gap: 24 — ABNORMAL HIGH (ref 5–15)
BUN: 66 mg/dL — ABNORMAL HIGH (ref 6–20)
CO2: 17 mmol/L — ABNORMAL LOW (ref 22–32)
Calcium: 9.8 mg/dL (ref 8.9–10.3)
Chloride: 96 mmol/L — ABNORMAL LOW (ref 98–111)
Creatinine, Ser: 9.66 mg/dL — ABNORMAL HIGH (ref 0.44–1.00)
GFR, Estimated: 5 mL/min — ABNORMAL LOW (ref >60)
Glucose, Bld: 76 mg/dL (ref 70–99)
Potassium: 5.1 mmol/L (ref 3.5–5.1)
Sodium: 137 mmol/L (ref 135–145)

## 2022-11-30 LAB — TROPONIN I (HIGH SENSITIVITY)
Troponin I (High Sensitivity): 400 ng/L (ref <18)
Troponin I (High Sensitivity): 428 ng/L (ref <18)
Troponin I (High Sensitivity): 411 ng/L (ref <18)
Troponin I (High Sensitivity): 393 ng/L (ref <18)

## 2022-11-30 LAB — CBC
HCT: 35.1 % — ABNORMAL LOW (ref 36.0–46.0)
Hemoglobin: 11.2 g/dL — ABNORMAL LOW (ref 12.0–15.0)
MCH: 30.2 pg (ref 26.0–34.0)
MCHC: 31.9 g/dL (ref 30.0–36.0)
MCV: 94.6 fL (ref 80.0–100.0)
Platelets: 236 10*3/uL (ref 150–400)
RBC: 3.71 MIL/uL — ABNORMAL LOW (ref 3.87–5.11)
RDW: 16.9 % — ABNORMAL HIGH (ref 11.5–15.5)
WBC: 5.2 10*3/uL (ref 4.0–10.5)
nRBC: 0 % (ref 0.0–0.2)

## 2022-11-30 LAB — BRAIN NATRIURETIC PEPTIDE: B Natriuretic Peptide: >4500 pg/mL — ABNORMAL HIGH (ref 0.0–100.0)

## 2022-11-30 LAB — ECHOCARDIOGRAM COMPLETE
AR max vel: 1.68 cm2
AV Area VTI: 1.78 cm2
AV Area mean vel: 1.77 cm2
AV Mean grad: 12 mmHg
AV Peak grad: 22.6 mmHg
Ao pk vel: 2.38 m/s
Area-P 1/2: 5.97 cm2
Calc EF: 36.4 %
Height: 64 in
MV M vel: 4.98 m/s
MV Peak grad: 99 mmHg
MV VTI: 1.79 cm2
P 1/2 time: 271 msec
Radius: 1.3 cm
S' Lateral: 4.6 cm
Single Plane A2C EF: 36.3 %
Single Plane A4C EF: 39.9 %
Weight: 1693.13 oz

## 2022-11-30 LAB — PHOSPHORUS: Phosphorus: 9.5 mg/dL — ABNORMAL HIGH (ref 2.5–4.6)

## 2022-11-30 LAB — HEPATITIS B SURFACE ANTIGEN: Hepatitis B Surface Ag: NONREACTIVE

## 2022-11-30 LAB — MAGNESIUM: Magnesium: 2.7 mg/dL — ABNORMAL HIGH (ref 1.7–2.4)

## 2022-11-30 MED ORDER — CARVEDILOL 12.5 MG PO TABS: 12.5000 mg | ORAL_TABLET | Freq: Two times a day (BID) | ORAL | Status: DC

## 2022-11-30 MED ORDER — RENA-VITE PO TABS
1.0000 | ORAL_TABLET | Freq: Every day | ORAL | Status: DC
  Filled 2022-11-30: qty 1

## 2022-11-30 MED ORDER — SEVELAMER CARBONATE 800 MG PO TABS
3200.0000 mg | ORAL_TABLET | Freq: Three times a day (TID) | ORAL | Status: DC
  Administered 2022-11-30: 3200 mg via ORAL
  Filled 2022-11-30 (×3): qty 4

## 2022-11-30 MED ORDER — ALBUTEROL SULFATE HFA 108 (90 BASE) MCG/ACT IN AERS: 2.0000 | INHALATION_SPRAY | RESPIRATORY_TRACT | Status: DC | PRN

## 2022-11-30 MED ORDER — CARVEDILOL 6.25 MG PO TABS
6.2500 mg | ORAL_TABLET | Freq: Two times a day (BID) | ORAL | Status: DC
  Administered 2022-11-30: 6.25 mg via ORAL
  Filled 2022-11-30: qty 2

## 2022-11-30 MED ORDER — NICOTINE 14 MG/24HR TD PT24
14.0000 mg | MEDICATED_PATCH | Freq: Every day | TRANSDERMAL | Status: DC
  Administered 2022-11-30 – 2022-12-01 (×2): 14 mg via TRANSDERMAL
  Filled 2022-11-30 (×5): qty 1

## 2022-11-30 MED ORDER — HEPARIN SODIUM (PORCINE) 5000 UNIT/ML IJ SOLN
5000.0000 [IU] | Freq: Three times a day (TID) | INTRAMUSCULAR | Status: DC
  Administered 2022-11-30 – 2022-12-01 (×3): 5000 [IU] via SUBCUTANEOUS
  Filled 2022-11-30 (×3): qty 1

## 2022-11-30 MED ORDER — ACETAMINOPHEN 650 MG RE SUPP: 650.0000 mg | Freq: Four times a day (QID) | RECTAL | Status: DC | PRN

## 2022-11-30 MED ORDER — ACETAMINOPHEN 325 MG PO TABS
650.0000 mg | ORAL_TABLET | Freq: Four times a day (QID) | ORAL | Status: DC | PRN
  Administered 2022-11-30 – 2022-12-06 (×6): 650 mg via ORAL
  Filled 2022-11-30 (×6): qty 2

## 2022-11-30 MED ORDER — ALBUTEROL SULFATE (2.5 MG/3ML) 0.083% IN NEBU: 2.5000 mg | INHALATION_SOLUTION | RESPIRATORY_TRACT | Status: DC | PRN

## 2022-11-30 MED ORDER — HYDROXYZINE HCL 10 MG PO TABS
10.0000 mg | ORAL_TABLET | Freq: Three times a day (TID) | ORAL | Status: DC | PRN
  Administered 2022-12-01: 10 mg via ORAL
  Filled 2022-11-30 (×2): qty 1

## 2022-11-30 MED ORDER — SEVELAMER CARBONATE 800 MG PO TABS: 2400.0000 mg | ORAL_TABLET | Freq: Three times a day (TID) | ORAL | Status: DC

## 2022-11-30 MED ORDER — CAMPHOR-MENTHOL 0.5-0.5 % EX LOTN
TOPICAL_LOTION | CUTANEOUS | Status: DC | PRN
  Filled 2022-11-30: qty 222

## 2022-11-30 NOTE — ED Notes (Signed)
Pt requesting to be placed on oxygen. Per provider can place pt on 1L O2 for comfort.

## 2022-11-30 NOTE — ED Notes (Signed)
Called lab to have BNP added to previous sample

## 2022-11-30 NOTE — ED Notes (Addendum)
Pt is a T, Th, Sat dialysis pt w/ old graft to L arm and restricted limb to R arm. Pt reports shob and that she is compliant w/ dialysis. Pt reports increased shob w/ ADLs and ambulating up stairs.

## 2022-11-30 NOTE — ED Notes (Signed)
Called lab to have magnesium added to previous collection

## 2022-11-30 NOTE — ED Notes (Signed)
Pt transported for Echo.

## 2022-11-30 NOTE — ED Provider Notes (Cosign Needed Addendum)
[facility] EMERGENCY DEPARTMENT Provider Note   CSN: [id number] Arrival date & time: 11/30/22  0753     History  Chief Complaint  Patient presents with   Shortness of Breath    [name] is a 41 y.o. female. with past medical history significant for lupus, lupus nephritis, ESRD on hemodialysis Tuesday Thursday Saturday, heart failure, presenting with worsening shortness of breath for the past 2 weeks.  She reports that she has been attending dialysis, last dialysis session was Saturday.  She states that she had her normal dialysis session.  She reports that she was at her dry weight.  She states that she can usually walk up her flight of stairs only stopping once, now she is having to stop every few steps.  She denies chest pain.  She reports she had a head cold approximately 1 week ago.  Her shortness of breath preceded this.  She feels it has resolved though her shortness of breath has continued.  She denies falls or injuries.      Home Medications Prior to Admission medications   Medication Sig Start Date End Date Taking? Authorizing Provider  acetaminophen (TYLENOL) 650 MG CR tablet Take 1,300 mg by mouth every 8 (eight) hours as needed for pain.    [provider]  carvedilol (COREG) 12.5 MG tablet Take 12.5 mg by mouth daily. 11/07/19   [provider]  diphenhydrAMINE (BENADRYL) 25 MG tablet Take 1 tablet (25 mg total) by mouth every 4 (four) hours as needed for itching or allergies. Patient taking differently: Take 25 mg by mouth 2 (two) times daily. 01/23/21   Lavina Hamman, MD  doxercalciferol (HECTOROL) 4 MCG/2ML injection Use as recommended by nephrology Patient taking differently: 4 mcg Every Tuesday,Thursday,and Saturday with dialysis. Use as recommended by nephrology 11/05/20   Hosie Poisson, MD  hydrOXYzine (ATARAX/VISTARIL) 10 MG tablet Take 1 tablet (10 mg total) by mouth 3 (three) times daily as needed for anxiety. Patient not  taking: Reported on 12/15/2021 07/29/19   Caccavale, Sophia, PA-C  loperamide (IMODIUM A-D) 2 MG tablet Take 4 mg by mouth daily as needed for diarrhea or loose stools. Dialysis days    [provider]  methocarbamol (ROBAXIN) 500 MG tablet Take 1 tablet (500 mg total) by mouth every 6 (six) hours as needed for muscle spasms. 04/29/22   Camillia Herter, NP  multivitamin (RENA-VIT) TABS tablet Take 1 tablet by mouth daily.    [provider]  Nutritional Supplements (NOVASOURCE RENAL) LIQD Take 237 mLs by mouth See admin instructions. Take 1 container (237 mls) by at bedtime on dialysis days (Tuesday, Thursday, Saturday)    [provider]  ondansetron (ZOFRAN-ODT) 4 MG disintegrating tablet Take 1 tablet (4 mg total) by mouth every 8 (eight) hours as needed for nausea or vomiting. 12/18/21   Concepcion Living, MD  pantoprazole (PROTONIX) 40 MG tablet Take 1 tablet (40 mg total) by mouth 2 (two) times daily before a meal. Patient not taking: Reported on 04/12/2022 12/18/21 01/17/22  Concepcion Living, MD  sevelamer carbonate (RENVELA) 800 MG tablet Take 2,400 mg by mouth 3 (three) times daily. 02/10/22   [provider]      Allergies    Cephalosporins, Tobramycin sulfate, and Vancomycin    Review of Systems   Review of Systems  Respiratory:  Positive for shortness of breath.     Physical Exam Updated Vital Signs BP 126/67   Pulse (!) 110  Temp 97.7 F (36.5 C)   Resp 20   Ht 5\' 4"  (1.626 m)   Wt 48 kg   SpO2 94%   BMI 18.16 kg/m  Physical Exam Constitutional:      Appearance: She is ill-appearing (chronically).  HENT:     Head: Normocephalic and atraumatic.  Eyes:     Extraocular Movements: Extraocular movements intact.  Cardiovascular:     Rate and Rhythm: Normal rate and regular rhythm.     Pulses: Normal pulses.     Comments: 2+ DP bilaterally Pulmonary:     Effort: Pulmonary effort is normal.     Breath sounds: Examination of the right-lower  field reveals decreased breath sounds. Examination of the left-lower field reveals decreased breath sounds. Decreased breath sounds present. No wheezing or rhonchi.  Abdominal:     Tenderness: There is no abdominal tenderness. There is no guarding or rebound.  Neurological:     Mental Status: She is alert.     ED Results / Procedures / Treatments   Labs (all labs ordered are listed, but only abnormal results are displayed) Labs Reviewed  CBC - Abnormal; Notable for the following components:      Result Value   RBC 3.71 (*)    Hemoglobin 11.2 (*)    HCT 35.1 (*)    RDW 16.9 (*)    All other components within normal limits  BASIC METABOLIC PANEL - Abnormal; Notable for the following components:   Chloride 96 (*)    CO2 17 (*)    BUN 66 (*)    Creatinine, Ser 9.66 (*)    GFR, Estimated 5 (*)    Anion gap 24 (*)    All other components within normal limits  TROPONIN I (HIGH SENSITIVITY) - Abnormal; Notable for the following components:   Troponin I (High Sensitivity) 400 (*)    All other components within normal limits  TROPONIN I (HIGH SENSITIVITY)    EKG None  Radiology DG Chest 2 View  Result Date: 11/30/2022 CLINICAL DATA:  Shortness of breath EXAM: CHEST - 2 VIEW COMPARISON:  04/12/2022 FINDINGS: Enlargement of cardiac silhouette with pulmonary vascular congestion. Atherosclerotic calcification aorta. Diffuse infiltrates bilaterally likely pulmonary edema increased versus previous exam. Minimal bibasilar atelectasis and tiny pleural effusions. BILATERAL nipple shadows. Fluid versus atelectasis at minor fissure. No pneumothorax or acute osseous findings. IMPRESSION: CHF. Aortic Atherosclerosis (ICD10-I70.0). Electronically Signed   By: Lavonia Dana M.D.   On: 11/30/2022 09:01    Procedures Procedures    Medications Ordered in ED Medications  albuterol (VENTOLIN HFA) 108 (90 Base) MCG/ACT inhaler 2 puff (has no administration in time range)    ED Course/ Medical  Decision Making/ A&P                           Medical Decision Making Amount and/or Complexity of Data Reviewed Labs: ordered. Decision-making details documented in ED Course. Radiology: ordered and independent interpretation performed. Decision-making details documented in ED Course.  Risk Prescription drug management. Decision regarding hospitalization.   Patient presents as above.  Patient has lupus with multiple complications including lupus nephritis, ESRD, heart failure.  Differential includes but is not limited to heart failure exacerbation, hypervolemia 2/2 ESRD.  I considered PE, however patient does not have new oxygen requirement, is not tachycardic, does not have chest pain.  I have low suspicion at this time for PE.  I personally reviewed the chest x-ray.  Chest x-ray  shows pleural effusions bilaterally, diffuse infiltrate bilaterally.   I personally reviewed the EKG which showed no ST elevations, no ST depressions, did show tachycardia.  Overall did not appear ischemic.  Labs notable for troponin elevated at 400.  This is notably higher than her baseline.  No leukocytosis.  Hemoglobin slightly low at 11.2.  BMP with anion gap of 24.  BNP greater than 4500.  Repeat troponin 428.  I discussed the patient with nephrology.  While she is currently hemodynamically stable and does not require emergent dialysis, she will require admission for her likely heart failure exacerbation and will require dialysis inpatient.  They will continue to follow.  Cardiology consulted.  They will also continue to follow the patient's course.  Patient requires admission for further management of her volume overload versus acute on chronic heart failure exacerbation.  I discussed the patient with the admitting team.  Patient is admitted to their service.         Final Clinical Impression(s) / ED Diagnoses Final diagnoses:  None    Rx / DC Orders ED Discharge Orders     None          Luster Landsberg, MD 11/30/22 [place], Nicasio Barlowe, MD 11/30/22 1627    Lennice Sites, DO 12/01/22 [phone]

## 2022-11-30 NOTE — ED Provider Notes (Signed)
Supervised resident visit.  Patient with history of heart failure, lupus, end-stage renal disease on hemodialysis presents with shortness of breath.  Progressive for the last couple weeks.  Has not missed any dialysis sessions.  Differential diagnosis likely ongoing volume overload versus cardiac process/CHF/ACS.  Less likely to be infectious process but could be as well as anemia.  Will check CBC, BMP, troponin, chest x-ray.  EKG per my review and interpretation shows junctional tach rhythm.  No ischemic changes.  Chest x-ray per my review interpretation seems consistent with volume overload.  Troponin elevated to 400 per my review and interpretation of labs.  Lab work otherwise unremarkable.  Overall not sure if this is volume overload or may be ACS.  Has not had an ischemic workup before in the past.  Does not have any chest pain.  Both nephrology and cardiology have been consulted and patient to be admitted to medicine for further care.   This chart was dictated using voice recognition software.  Despite best efforts to proofread,  errors can occur which can change the documentation meaning.    Lennice Sites, DO 11/30/22 1142

## 2022-11-30 NOTE — Consult Note (Addendum)
Cardiology Consultation   Patient ID: ANGELIN CUTRONE MRN: [id number]; DOB: [birth year]  Admit date: 11/30/2022 Date of Consult: 11/30/2022  PCP:  Camillia Herter, NP   [place] [facility] Cardiologist:  Buford Dresser, MD        Patient Profile:   [name] is a 41 y.o. female with a hx of lupus, lupus nephritis, ESRD (on hemodialysis Tuesday Thursday Saturday), systolic heart failure, polysubstance use who is being seen 11/30/2022 for the evaluation of elevated troponin at the request of Dr. Ronnald Nian.  History of Present Illness:   [name] is a 41 year old female with above-noted medical history who presented to the emergency department this morning due to worsening shortness of breath x2 weeks.  Patient says that shortness of breath occurs with even minimal exertion. She says that at baseline she is able to walk up a flight of stairs with 1 break in the middle. Says that now she has to take a break every few steps.  Patient does sleep in an inclined position at night, on 2-3 pillows.  While this has not changed, patient does feel like her shortness of breath with supine positioning is overall worse. Patient denies missed dialysis sessions, last was on Saturday.  Patient reports a dry weight of around 44 kg, says that she remembers being told her weight was 43.1 kg this past Saturday following dialysis.  She says that she thinks nephrology has been removing slightly less fluid in recent sessions.  Patient reports having a head cold a couple of weeks ago but other than that has not felt ill with fevers chills or body aches.  She admits to struggling with medication compliance, says that she remembers to take Coreg about every other day.  Patient does use tobacco, says she last smoked about 3 weeks ago.  She does not consume alcohol anymore.  Reports cocaine use around the time of Thanksgiving, denies any use since.  Patient was seen by Dr. Harrell Gave on 11/05/2020  secondary to abnormal echocardiogram with moderate to severe MR and aortic valve disease.  At that time, a TEE was discussed and patient expressed a preference to have this completed as an outpatient.  Unfortunately it appears the patient was lost to follow-up and was never seen in clinic for this to take place.  Patient tells me today that she was concerned about needing to have surgery and did not follow-up.  Past Medical History:  Diagnosis Date   Anemia    low iron - receives iron at dialysis   Anxiety    Arthritis    RA   Chronic systolic congestive heart failure (HCC) 03/16/2016   Dyspnea    ESRD (end stage renal disease) ([place])    [facility] TTHSAT _ [place]   H/O pericarditis 01/17/2013   H/O pleural effusion 01/17/2013   Heart murmur    Lupus (systemic lupus erythematosus) (HCC)    Previously followed with Dr. 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 pneumonae bacteremia on 01/21/2013. Sensitive  to Peniccilin     Past Surgical History:  Procedure Laterality Date   AV FISTULA PLACEMENT     AV FISTULA PLACEMENT Right 08/01/2020   Procedure: RIGHT ARM BRACHIOCEPHALIC  ARTERIOVENOUS (AV) FISTULA CREATION;  Surgeon: [name], MD;  Location: [place];  Service: Vascular;  Laterality: Right;   [place] Left 02/05/2014   Procedure: [place];  Surgeon: [name], MD;  Location: [place];  Service: Vascular;  Laterality: Left;   [place] Right 01/08/2021   Procedure: RIGHT ARM SECOND STAGE  [place];  Surgeon: [name], MD;  Location: [place];  Service: Vascular;  Laterality: Right;   BIOPSY  12/17/2021   Procedure: BIOPSY;  Surgeon: Irving Copas., MD;  Location: [place];  Service: Gastroenterology;;   ESOPHAGOGASTRODUODENOSCOPY (EGD) WITH PROPOFOL N/A 12/17/2021   Procedure: ESOPHAGOGASTRODUODENOSCOPY (EGD) WITH PROPOFOL;  Surgeon: Irving Copas., MD;  Location: [place];  Service: Gastroenterology;  Laterality: N/A;   FISTULA SUPERFICIALIZATION Left 05/30/2018   Procedure: FISTULA PLICATION BASILIC VEIN TRANSPOSITION;  Surgeon: Angelia Mould, MD;  Location: [place];  Service: Vascular;  Laterality: Left;   FISTULA SUPERFICIALIZATION Left 77/41/2878   Procedure: PLICATION OF LEFT ARTERIOVENOUS FISTULA ULCER;  Surgeon: [name], MD;  Location: [place];  Service: Vascular;  Laterality: Left;   I & D EXTREMITY Right 02/18/2021   Procedure: IRRIGATION AND DEBRIDEMENT OF ARM;  Surgeon: Angelia Mould, MD;  Location: [place];  Service: Vascular;  Laterality: Right;   INSERTION OF DIALYSIS CATHETER N/A 05/19/2020   Procedure: TUNNELED INSERTION  OF DIALYSIS CATHETER;  Surgeon: Waynetta Sandy, MD;  Location: [place];  Service: Vascular;  Laterality: N/A;   THROMBECTOMY AND REVISION OF ARTERIOVENTOUS (AV) GORETEX  GRAFT Left 07/28/2020   Procedure: Oversewing of left arm Brachial cephalic fistula for bleeding.;  Surgeon: Angelia Mould, MD;  Location: [place];  Service: Vascular;  Laterality: Left;   VENOGRAM Right 01/31/2014   Procedure: DIALYSIS CATHETER;  Surgeon: Serafina Mitchell, MD;  Location: [facility] CATH LAB;  Service: Cardiovascular;  Laterality: Right;     Home Medications:  Prior to Admission medications   Medication Sig Start Date End Date Taking? Authorizing Provider  acetaminophen (TYLENOL) 650 MG CR tablet Take 1,300 mg by mouth every 8 (eight) hours as needed for pain.    [provider]   carvedilol (COREG) 12.5 MG tablet Take 12.5 mg by mouth daily. 11/07/19   [provider]  diphenhydrAMINE (BENADRYL) 25 MG tablet Take 1 tablet (25 mg total) by mouth every 4 (four) hours as needed for itching or allergies. Patient taking differently: Take 25 mg by mouth 2 (two) times daily. 01/23/21   Lavina Hamman, MD  doxercalciferol (HECTOROL) 4 MCG/2ML injection Use as recommended by nephrology Patient taking differently: 4 mcg Every Tuesday,Thursday,and Saturday with dialysis. Use as recommended by nephrology 11/05/20   Hosie Poisson, MD  hydrOXYzine (ATARAX/VISTARIL) 10 MG tablet Take 1 tablet (10 mg total) by mouth 3 (three) times daily as needed for anxiety. Patient not taking: Reported on 12/15/2021 07/29/19   Caccavale, Sophia, PA-C  loperamide (IMODIUM A-D) 2 MG tablet Take 4 mg by mouth daily as needed for diarrhea or loose stools. Dialysis days    [provider]  methocarbamol (ROBAXIN) 500 MG tablet Take 1 tablet (500 mg total) by mouth every 6 (six) hours as needed for muscle spasms. 04/29/22   Camillia Herter, NP  multivitamin (  RENA-VIT) TABS tablet Take 1 tablet by mouth daily.    [provider]  Nutritional Supplements (NOVASOURCE RENAL) LIQD Take 237 mLs by mouth See admin instructions. Take 1 container (237 mls) by at bedtime on dialysis days (Tuesday, Thursday, Saturday)    [provider]  ondansetron (ZOFRAN-ODT) 4 MG disintegrating tablet Take 1 tablet (4 mg total) by mouth every 8 (eight) hours as needed for nausea or vomiting. 12/18/21   Concepcion Living, MD  pantoprazole (PROTONIX) 40 MG tablet Take 1 tablet (40 mg total) by mouth 2 (two) times daily before a meal. Patient not taking: Reported on 04/12/2022 12/18/21 01/17/22  Concepcion Living, MD  sevelamer carbonate (RENVELA) 800 MG tablet Take 2,400 mg by mouth 3 (three) times daily. 02/10/22   [provider]    Inpatient Medications: Scheduled Meds:  sodium chloride flush   3 mL Intravenous Q12H   Continuous Infusions:  sodium chloride     PRN Meds: sodium chloride, albuterol, sodium chloride flush  Allergies:    Allergies  Allergen Reactions   Cephalosporins Rash    To both keflex and cefazolin   Tobramycin Sulfate Swelling    Eye swelling   Vancomycin Swelling    Social History:   Social History   Socioeconomic History   Marital status: Single    Spouse name: Not on file   Number of children: Not on file   Years of education: Not on file   Highest education level: Not on file  Occupational History   Not on file  Tobacco Use   Smoking status: Every Day    Packs/day: 0.12    Years: 15.00    Total pack years: 1.80    Types: Cigarettes    Passive exposure: Current   Smokeless tobacco: Never   Tobacco comments:    4 cigarettes per day  Vaping Use   Vaping Use: Never used  Substance and Sexual Activity   Alcohol use: Yes    Alcohol/week: 0.0 standard drinks of alcohol    Comment: Special Occasional takes Vicar   Drug use: Yes    Types: Marijuana    Comment: ocassional, last time- 12/21/20   Sexual activity: Not Currently    Birth control/protection: None  Other Topics Concern   Not on file  Social History Narrative   Not on file   Social Determinants of Health   Financial Resource Strain: Not on file  Food Insecurity: Not on file  Transportation Needs: Not on file  Physical Activity: Not on file  Stress: Not on file  Social Connections: Not on file  Intimate Partner Violence: Not on file    Family History:   History reviewed. No pertinent family history.   ROS:  Please see the history of present illness.   All other ROS reviewed and negative.     Physical Exam/Data:   Vitals:   11/30/22 1041 11/30/22 1045 11/30/22 1137 11/30/22 1209  BP:  136/66 120/76   Pulse:   100   Resp:  (!) 28 (!) 25   Temp:    97.7 F (36.5 C)  TempSrc:      SpO2: 97%  100%   Weight:      Height:       No intake or output data in  the 24 hours ending 11/30/22 1236    11/30/2022    8:00 AM 11/05/2022    9:48 AM 04/29/2022    9:33 AM  Last 3 Weights  Weight (lbs)  105 lb 13.1 oz 105 lb 13.1 oz 102 lb 6.4 oz  Weight (kg) 48 kg 48 kg 46.448 kg     Body mass index is 18.16 kg/m.  General:  Well nourished, well developed, in no acute distress HEENT: normal Neck: JVD to mandible with head of bed at 30 degrees Vascular: No carotid bruits; Distal pulses 2+ bilaterally Cardiac:  normal S1, S2; RRR; holosystolic murmur heard best at apex Lungs:  clear to auscultation bilaterally, no wheezing, rhonchi or rales  Abd: soft, nontender, no hepatomegaly  Ext: no edema Musculoskeletal:  No deformities, BUE and BLE strength normal and equal Skin: warm and dry  Neuro:  CNs 2-12 intact, no focal abnormalities noted Psych:  Normal affect   EKG:  The EKG was personally reviewed and demonstrates:  first degree AV block pattern. T wave inversions in V6 (not new).  Telemetry:  Telemetry was personally reviewed and demonstrates: Sinus rhythm with first-degree AV block  Relevant CV Studies:  IMPRESSIONS     1. Left ventricular ejection fraction, by estimation, is 30 to 35%. The  left ventricle has moderately decreased function. The left ventricle  demonstrates global hypokinesis. The left ventricular internal cavity size  was mildly dilated. There is moderate   left ventricular hypertrophy. Left ventricular diastolic parameters are  indeterminate.   2. Right ventricular systolic function is low normal. The right  ventricular size is mildly enlarged. There is mildly elevated pulmonary  artery systolic pressure.   3. Left atrial size was severely dilated.   4. Right atrial size was moderately dilated.   5. Severe posterior mitral anular calcfication, with a calcified linear  portion protruding into the left atrium. The MR vena contracta is 0.6 cm.  Primary MR etiology looks to be functional due to tethering of posterior  leaflet.  Moderate mitral stenosis  with mean gradient of 7 mmHg. . The mitral valve is abnormal. Moderate to  severe mitral valve regurgitation. Moderate mitral stenosis. Severe mitral  annular calcification.   6. The aortic valve is tricuspid. There is mild calcification of the  aortic valve. There is mild thickening of the aortic valve. Aortic valve  regurgitation is moderate. No aortic stenosis is present.   FINDINGS   Left Ventricle: Left ventricular ejection fraction, by estimation, is 30  to 35%. The left ventricle has moderately decreased function. The left  ventricle demonstrates global hypokinesis. The left ventricular internal  cavity size was mildly dilated.  There is moderate left ventricular hypertrophy. Left ventricular diastolic  parameters are indeterminate.   Right Ventricle: The right ventricular size is mildly enlarged. Right  vetricular wall thickness was not assessed. Right ventricular systolic  function is low normal. There is mildly elevated pulmonary artery systolic  pressure. The tricuspid regurgitant  velocity is 2.96 m/s, and with an assumed right atrial pressure of 3 mmHg,  the estimated right ventricular systolic pressure is 86.7 mmHg.   Left Atrium: Left atrial size was severely dilated.   Right Atrium: Right atrial size was moderately dilated.   Pericardium: There is no evidence of pericardial effusion.   Mitral Valve: Severe posterior mitral anular calcfication, with a  calcified linear portion protruding into the left atrium. The MR vena  contracta is 0.6 cm. Primary MR etiology looks to be functional due to  tethering of posterior leaflet. Moderate mitral   stenosis with mean gradient of 7 mmHg. The mitral valve is abnormal.  There is moderate thickening of the mitral valve leaflet(s). There is  moderate calcification of the mitral valve leaflet(s). Severe mitral  annular calcification. Moderate to severe  mitral valve regurgitation. Moderate mitral valve  stenosis. The mean  mitral valve gradient is 7.6 mmHg with average heart rate of 99 bpm.   Tricuspid Valve: The tricuspid valve is normal in structure. Tricuspid  valve regurgitation is mild . No evidence of tricuspid stenosis.   Aortic Valve: The aortic valve is tricuspid. There is mild calcification  of the aortic valve. There is mild thickening of the aortic valve. There  is mild aortic valve annular calcification. Aortic valve regurgitation is  moderate. Aortic regurgitation PHT   measures 474 msec. No aortic stenosis is present. Aortic valve mean  gradient measures 7.3 mmHg. Aortic valve peak gradient measures 14.0 mmHg.  Aortic valve area, by VTI measures 3.26 cm.   Pulmonic Valve: The pulmonic valve was not well visualized. Pulmonic valve  regurgitation is trivial. No evidence of pulmonic stenosis.   Aorta: The aortic root is normal in size and structure.   Pulmonary Artery: 35.     IAS/Shunts: No atrial level shunt detected by color flow Doppler.    Laboratory Data:  High Sensitivity Troponin:   Recent Labs  Lab 11/30/22 0806  TROPONINIHS 400*     Chemistry Recent Labs  Lab 11/30/22 0806  NA 137  K 5.1  CL 96*  CO2 17*  GLUCOSE 76  BUN 66*  CREATININE 9.66*  CALCIUM 9.8  GFRNONAA 5*  ANIONGAP 24*    No results for input(s): "PROT", "ALBUMIN", "AST", "ALT", "ALKPHOS", "BILITOT" in the last 168 hours. Lipids No results for input(s): "CHOL", "TRIG", "HDL", "LABVLDL", "LDLCALC", "CHOLHDL" in the last 168 hours.  Hematology Recent Labs  Lab 11/30/22 0806  WBC 5.2  RBC 3.71*  HGB 11.2*  HCT 35.1*  MCV 94.6  MCH 30.2  MCHC 31.9  RDW 16.9*  PLT 236   Thyroid No results for input(s): "TSH", "FREET4" in the last 168 hours.  BNP Recent Labs  Lab 11/30/22 1100  BNP >4,500.0*    DDimer No results for input(s): "DDIMER" in the last 168 hours.   Radiology/Studies:  DG Chest 2 View  Result Date: 11/30/2022 CLINICAL DATA:  Shortness of breath EXAM:  CHEST - 2 VIEW COMPARISON:  04/12/2022 FINDINGS: Enlargement of cardiac silhouette with pulmonary vascular congestion. Atherosclerotic calcification aorta. Diffuse infiltrates bilaterally likely pulmonary edema increased versus previous exam. Minimal bibasilar atelectasis and tiny pleural effusions. BILATERAL nipple shadows. Fluid versus atelectasis at minor fissure. No pneumothorax or acute osseous findings. IMPRESSION: CHF. Aortic Atherosclerosis (ICD10-I70.0). Electronically Signed   By: Lavonia Dana M.D.   On: 11/30/2022 09:01     Assessment and Plan:   Acute heart failure with reduced ejection fraction Cardiomyopathy, unspecified  Patient first noted with reduced LVEF in 2017, 20 to 25%.  Most recent echocardiogram completed 11/03/2020 with LVEF 30 to 35%.  This echo was also notable for severe left atrial dilation.  Severe posterior mitral annular calcification with moderate mitral stenosis, moderate to severe mitral valve regurgitation.  BNP >4500 this admission. Patient on home regimen of only Coreg 12.5 mg. Reportedly only takes once daily. Continue Coreg Diuresis per nephrology team   Moderate to severe MR, severe mitral annular calcification Aortic regurgitation, moderate  11/03/2020 transthoracic echocardiogram with severe posterior mitral annular calcification, moderate to severe mitral valve regurgitation with moderate mitral stenosis.  Aortic valve regurgitation rated as moderate also noted.  Unfortunately, patient lost to follow-up and never had transesophageal echocardiogram completed.  Suspect that patient's current symptoms are in part a result of progressive valvular disease.  We will assess a repeat transthoracic echocardiogram but ultimately patient needs more definitive imaging with transesophageal echocardiogram.  Given previous issues with cardiology follow-up, would strongly suggest that this takes place while patient is admitted.  Elevated troponin  Patient with  troponin elevated to 400,428 this admission. Denies chest pain. ECG without acute/new ischemic changes (T wave inversion in V6 noted on April 2023 ECG).  Given flat troponin trend, less likely acute ischemia, and more likely due to acute heart failure, valvular disease.  TTE ordered to assess LVEF, wall motion abnormalities.  If wall motion abnormalities, could consider ischemic evaluation.  ESRD  On Tuesday, Thursday, Saturday dialysis schedule.  Denies recent missed sessions.  Patient of whether she always completes the full session however.  Creatinine 9.66 today.  K5.1, magnesium 2.7.        New York Heart Association (NYHA) Functional Class NYHA Class IV        For questions or updates, please contact [place] Please consult [email or web address] for contact info under    Signed, Lily Kocher, PA-C  11/30/2022 12:36 PM

## 2022-11-30 NOTE — ED Triage Notes (Signed)
Pt endorses SOB for 2 weeks and worried about being hypercalcemic. Pt last HD treatment Saturday. Endorses 2/10 right sided abd pain.

## 2022-11-30 NOTE — Consult Note (Signed)
[facility] Renal Consultation Note    Indication for Consultation:  Management of ESRD/hemodialysis, anemia, hypertension/volume, and secondary hyperparathyroidism. PCP:  HPI: [name] is a 41 y.o. female with ESRD, HTN,SLE/lupus nephritis, HFrEF (30-35% EF), AV/MV regurgitation who was admitted with dyspnea.  Reports progressive dyspnea over the past 2 weeks despite dialysis, both with exertion and at night/orthopnea. No specific chest pains. No fever or chills. No abdominal pain, N/V/D.  Presented to ED this AM - she was normotensive and afebrile. Labs with K 5.1, BUN 66, Cr 9.66, Trop 400 -> 428, BNP > 4500, WBC 5.2, Hgb 11.2. CXR with CHF. Cardiology was consulted for the elevated troponins - echo ordered, felt demand ischemia/HF exacerbation.  Dialyzes on TTS schedule at [facility] clinic. She does attend most of her HD sessions, occ cuts the time. Has consistently been meeting her dry weight of 44.3kg. She does not follow a renal diet and does not take her medications as prescribed. Occ drug use.  Past Medical History:  Diagnosis Date   Anemia    low iron - receives iron at dialysis   Anxiety    Arthritis    RA   Chronic systolic congestive heart failure (HCC) 03/16/2016   Dyspnea    ESRD (end stage renal disease) ([place])    [facility] _ [place]   H/O pericarditis 01/17/2013   H/O pleural effusion 01/17/2013   Heart murmur    Lupus (systemic lupus erythematosus) (HCC)    Previously followed with Dr. 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 pneumonae bacteremia on 01/21/2013. Sensitive to Peniccilin    Past Surgical History:  Procedure Laterality Date   AV FISTULA PLACEMENT     AV FISTULA PLACEMENT Right 08/01/2020   Procedure: RIGHT ARM BRACHIOCEPHALIC  ARTERIOVENOUS (AV) FISTULA CREATION;  Surgeon: [name], MD;  Location: [place];  Service: Vascular;  Laterality: Right;   [place] Left 02/05/2014   Procedure: [place];  Surgeon: [name], MD;  Location: [place];  Service: Vascular;  Laterality: Left;   [place] Right 01/08/2021   Procedure: RIGHT ARM SECOND STAGE [place];  Surgeon: [name], MD;  Location: [place];  Service: Vascular;  Laterality: Right;   BIOPSY  12/17/2021   Procedure: BIOPSY;  Surgeon: Irving Copas., MD;  Location: [place];  Service: Gastroenterology;;   ESOPHAGOGASTRODUODENOSCOPY (EGD) WITH PROPOFOL N/A 12/17/2021   Procedure: ESOPHAGOGASTRODUODENOSCOPY (EGD) WITH PROPOFOL;  Surgeon: Irving Copas., MD;  Location: [place];  Service: Gastroenterology;  Laterality: N/A;   FISTULA SUPERFICIALIZATION Left 05/30/2018   Procedure: FISTULA PLICATION BASILIC VEIN TRANSPOSITION;  Surgeon: Angelia Mould, MD;  Location: [facility] OR;  Service: Vascular;  Laterality: Left;   FISTULA SUPERFICIALIZATION Left 96/28/3662   Procedure: PLICATION OF LEFT ARTERIOVENOUS FISTULA ULCER;  Surgeon: [name], MD;  Location: [place];  Service: Vascular;  Laterality: Left;   I & D EXTREMITY Right 02/18/2021   Procedure: IRRIGATION AND DEBRIDEMENT OF  ARM;  Surgeon: Angelia Mould, MD;  Location: [place];  Service: Vascular;  Laterality: Right;   INSERTION OF DIALYSIS CATHETER N/A 05/19/2020   Procedure: TUNNELED INSERTION  OF DIALYSIS CATHETER;  Surgeon: Waynetta Sandy, MD;  Location: [place];  Service: Vascular;  Laterality: N/A;   THROMBECTOMY AND REVISION OF  ARTERIOVENTOUS (AV) GORETEX  GRAFT Left 07/28/2020   Procedure: Oversewing of left arm Brachial cephalic fistula for bleeding.;  Surgeon: Angelia Mould, MD;  Location: [place];  Service: Vascular;  Laterality: Left;   VENOGRAM Right 01/31/2014   Procedure: DIALYSIS CATHETER;  Surgeon: Serafina Mitchell, MD;  Location[facility] C[facility];  Service: Cardiovascular;  Laterality: Right;   History reviewed. No pertinent family history. Social History:  reports that she has been smoking cigarettes. She has a 1.80 pack-year smoking history. She has been exposed to tobacco smoke. She has never used smokeless tobacco. She reports current alcohol use. She reports current drug use. Drug: Marijuana.  ROS: As per HPI otherwise negative.  Physical Exam: Vitals:   11/30/22 1045 11/30/22 1137 11/30/22 1209 11/30/22 1300  BP: 136/66 120/76  123/66  Pulse:  100  99  Resp: (!) 28 (!) 25  10  Temp:   97.7 F (36.5 C)   TempSrc:      SpO2:  100%  100%  Weight:      Height:         General: Well developed, well nourished, in no acute distress. Nasal O2 in place. Head: Normocephalic, atraumatic, sclera non-icteric, mucus membranes are moist. Neck: Supple without lymphadenopathy/masses. JVD not elevated. Lungs: Clear bilaterally to auscultation without wheezes, rales, or rhonchi. Breathing is unlabored. Heart: RRR; 2/6 murmur Abdomen: Soft, non-tender, non-distended with normoactive bowel sounds.  Musculoskeletal:  Strength and tone appear normal for age. Lower extremities: No edema or ischemic changes, no open wounds. Neuro: Alert and oriented X 3. Moves all extremities spontaneously. Psych:  Responds to questions appropriately with a normal affect. Dialysis Access: RUE AVF + thrill/bruit  Allergies  Allergen Reactions   Cephalosporins Rash    To both keflex and cefazolin   Tobramycin Sulfate Swelling    Eye swelling   Vancomycin Swelling   Prior to Admission medications   Medication Sig Start Date  End Date Taking? Authorizing Provider  acetaminophen (TYLENOL) 650 MG CR tablet Take 1,300 mg by mouth every 8 (eight) hours as needed for pain.   Yes [provider]  carvedilol (COREG) 12.5 MG tablet Take 12.5 mg by mouth daily. 11/07/19  Yes [provider]  cinacalcet (SENSIPAR) 30 MG tablet Take 30 mg by mouth every evening. 11/24/22  Yes [provider]  diphenhydrAMINE (BENADRYL) 25 MG tablet Take 1 tablet (25 mg total) by mouth every 4 (four) hours as needed for itching or allergies. Patient taking differently: Take 25 mg by mouth 2 (two) times daily. 01/23/21  Yes Lavina Hamman, MD  doxercalciferol (HECTOROL) 4 MCG/2ML injection Use as recommended by nephrology Patient taking differently: 4 mcg Every Tuesday,Thursday,and Saturday with dialysis. Use as recommended by nephrology 11/05/20  Yes Hosie Poisson, MD  loperamide (IMODIUM A-D) 2 MG tablet Take 4 mg by mouth daily as needed for diarrhea or loose stools. Dialysis days   Yes [provider]  multivitamin (RENA-VIT) TABS tablet Take 1 tablet by mouth daily.   Yes [provider]  Nutritional Supplements (NOVASOURCE RENAL) LIQD Take 237 mLs by mouth See admin instructions. Take 1 container (237 mls) by at  bedtime on dialysis days (Tuesday, Thursday, Saturday)   Yes [provider]  sevelamer carbonate (RENVELA) 800 MG tablet Take 2,400 mg by mouth 3 (three) times daily. 02/10/22  Yes [provider]  ondansetron (ZOFRAN-ODT) 4 MG disintegrating tablet Take 1 tablet (4 mg total) by mouth every 8 (eight) hours as needed for nausea or vomiting. Patient not taking: Reported on 11/30/2022 12/18/21   Concepcion Living, MD  pantoprazole (PROTONIX) 40 MG tablet Take 1 tablet (40 mg total) by mouth 2 (two) times daily before a meal. Patient not taking: Reported on 04/12/2022 12/18/21 01/17/22  Concepcion Living, MD   Current Facility-Administered Medications  Medication Dose Route Frequency  Provider Last Rate Last Admin   0.9 %  sodium chloride infusion  250 mL Intravenous PRN Serafina Mitchell, MD       acetaminophen (TYLENOL) tablet 650 mg  650 mg Oral Q6H PRN Rick Duff, MD       Or   acetaminophen (TYLENOL) suppository 650 mg  650 mg Rectal Q6H PRN Rick Duff, MD       albuterol (VENTOLIN HFA) 108 (90 Base) MCG/ACT inhaler 2 puff  2 puff Inhalation Q2H PRN Curatolo, Adam, DO       camphor-menthol (SARNA) lotion   Topical PRN Rick Duff, MD       carvedilol (COREG) tablet 6.25 mg  6.25 mg Oral BID WC Lily Kocher, PA-C       heparin injection 5,000 Units  5,000 Units Subcutaneous Q8H Rick Duff, MD       hydrOXYzine (ATARAX) tablet 10 mg  10 mg Oral TID PRN Rick Duff, MD       multivitamin (RENA-VIT) tablet 1 tablet  1 tablet Oral Daily Rick Duff, MD       nicotine (NICODERM CQ - dosed in mg/24 hours) patch 14 mg  14 mg Transdermal Daily Rick Duff, MD       sevelamer carbonate (RENVELA) tablet 2,400 mg  2,400 mg Oral TID Rick Duff, MD       sodium chloride flush (NS) 0.9 % injection 3 mL  3 mL Intravenous Q12H Serafina Mitchell, MD       sodium chloride flush (NS) 0.9 % injection 3 mL  3 mL Intravenous PRN Serafina Mitchell, MD       Current Outpatient Medications  Medication Sig Dispense Refill   acetaminophen (TYLENOL) 650 MG CR tablet Take 1,300 mg by mouth every 8 (eight) hours as needed for pain.     carvedilol (COREG) 12.5 MG tablet Take 12.5 mg by mouth daily.     cinacalcet (SENSIPAR) 30 MG tablet Take 30 mg by mouth every evening.     diphenhydrAMINE (BENADRYL) 25 MG tablet Take 1 tablet (25 mg total) by mouth every 4 (four) hours as needed for itching or allergies. (Patient taking differently: Take 25 mg by mouth 2 (two) times daily.) 30 tablet 0   doxercalciferol (HECTOROL) 4 MCG/2ML injection Use as recommended by nephrology (Patient taking differently: 4 mcg Every Tuesday,Thursday,and Saturday with  dialysis. Use as recommended by nephrology) 2 mL    loperamide (IMODIUM A-D) 2 MG tablet Take 4 mg by mouth daily as needed for diarrhea or loose stools. Dialysis days     multivitamin (RENA-VIT) TABS tablet Take 1 tablet by mouth daily.     Nutritional Supplements (NOVASOURCE RENAL) LIQD Take 237 mLs by mouth See admin instructions. Take 1 container (237 mls) by at bedtime on dialysis days (Tuesday, Thursday,  Saturday)     sevelamer carbonate (RENVELA) 800 MG tablet Take 2,400 mg by mouth 3 (three) times daily.     ondansetron (ZOFRAN-ODT) 4 MG disintegrating tablet Take 1 tablet (4 mg total) by mouth every 8 (eight) hours as needed for nausea or vomiting. (Patient not taking: Reported on 11/30/2022) 20 tablet 0   pantoprazole (PROTONIX) 40 MG tablet Take 1 tablet (40 mg total) by mouth 2 (two) times daily before a meal. (Patient not taking: Reported on 04/12/2022) 60 tablet 0   Labs: Basic Metabolic Panel: Recent Labs  Lab 11/30/22 0806  NA 137  K 5.1  CL 96*  CO2 17*  GLUCOSE 76  BUN 66*  CREATININE 9.66*  CALCIUM 9.8   CBC: Recent Labs  Lab 11/30/22 0806  WBC 5.2  HGB 11.2*  HCT 35.1*  MCV 94.6  PLT 236   Studies/Results: DG Chest 2 View  Result Date: 11/30/2022 CLINICAL DATA:  Shortness of breath EXAM: CHEST - 2 VIEW COMPARISON:  04/12/2022 FINDINGS: Enlargement of cardiac silhouette with pulmonary vascular congestion. Atherosclerotic calcification aorta. Diffuse infiltrates bilaterally likely pulmonary edema increased versus previous exam. Minimal bibasilar atelectasis and tiny pleural effusions. BILATERAL nipple shadows. Fluid versus atelectasis at minor fissure. No pneumothorax or acute osseous findings. IMPRESSION: CHF. Aortic Atherosclerosis (ICD10-I70.0). Electronically Signed   By: Lavonia Dana M.D.   On: 11/30/2022 09:01    Dialysis Orders:  TTS at [facility] 3:45hr, 400/A1.5, EDW 44kg, UFP #4, AVF, no heparin - Hectoral 28mcg IV q HD (just lowered from 73mcg). - Insurance  will not approve Sensipar - no ESA - Last Phos 10.8 - supposed to be taking Renvela 4/meals  Assessment/Plan:  Acute respiratory failure/HF exacerbation: CXR with pulm edema, likely lost weight and needs lower EDW. Will maximize UF with HD tomorrow morning.  Elevated troponin: Cardiology consulted, TTE ordered.   ESRD:  Usual TTS schedule - would like to give her an extra HD today but unfortunately due to large HD census - will plan on HD 1st thing in AM with large UF goal and will plan to lower EDW.  Hypertension/volume: BP controlled, pulm edema on CXR.  Anemia: Hgb 11.2 - no ESA yet.  Metabolic bone disease: Ca ok, Phos pending. Restart home binders. SLE  Veneta Penton, PA-C 11/30/2022, 2:20 PM  [employer]

## 2022-11-30 NOTE — Hospital Course (Addendum)
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  She presents with progressively worsening shortness of breath over the last 2 to 3 weeks.    Patient states that at her baseline she is able to walk up a flight of stairs with minimal shortness of breath.  She is able to bathe and close her self without any dyspnea as well as working job as a Geophysicist/field seismologist.  However, over the past 2 to 3 weeks she states that she has become more dyspneic and fatigued.  She has become short of breath with bathing and getting dressed.  She has to stop multiple times to catch her breath when ambulating and she sometimes gets dizzy.  She also notes paroxysmal nocturnal dyspnea about 3-4 times a night and she chronically sleeps with multiple pillows.  She also states that she has had a decreased appetite which she only noticed about 3 days prior to admission and she is sleeping more than usual and has lost interest and doing some the activities she normally does.   Of note, patient states that she had a "head cold" about 2 weeks ago for which she took a COVID test that was initially positive however repeat test was negative.  She had no cough, fever, or sneezing.   Patient is anuric but has not missed an HD session recently, with her last session about 2 days prior to admission. She does state that she regularly stops her HD sessions short of completion as she is usually uncomfortable.  She most recently was paused them because she was experiencing a headache with hemodialysis.  She also notes that between her hemodialysis sessions she typically requires oxygen at her HD sessions  because of the fluid buildup.   Patient denies chest pain.      2-3 weeks bad HA with HD and breathing was labored went to hospital. Came here but did not stay.    Having sensation to use bathroom but doesn't always go. Last BM this AM.   Beginning of the year PCP.   Saw cardiologist Dr Harrell Gave    Coreg  Diltiazem  Taking imodium on HD days  Take benadryl every day 2-3 times a day, does not work

## 2022-11-30 NOTE — ED Notes (Signed)
6E called re purpleman initiation

## 2022-11-30 NOTE — ED Notes (Signed)
Placed bp cuff on the lower left arm

## 2022-11-30 NOTE — H&P (Cosign Needed Addendum)
Date: 11/30/2022               Patient Name:  [name] MRN: [id number]  DOB: [birth year] Age / Sex: 41 y.o., female   PCP: Camillia Herter, NP         Medical Service: Internal Medicine Teaching Service         Attending Physician: Dr. Charise Killian, MD    First Contact: Dr. Leigh Aurora  Pager: [phone]  Second Contact: Dr. Idamae Schuller  Pager: [phone]       After Hours (After 5p/  First Contact Pager: [phone]  weekends / holidays): Second Contact Pager: [phone]   Chief Complaint: Dyspnea   History of Present Illness:   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  She presents with progressively worsening shortness of breath over the last 2 to 3 weeks.    Patient states that at her baseline she is able to walk up a flight of stairs with minimal shortness of breath.  She is able to bathe and close her self without any dyspnea as well as working job as a Geophysicist/field seismologist.  However, over the past 2 to 3 weeks she states that she has become more dyspneic and fatigued.  She has become short of breath with bathing and getting dressed.  She has to stop multiple times to catch her breath when ambulating and she sometimes gets dizzy.  She also notes paroxysmal nocturnal dyspnea about 3-4 times a night and she chronically sleeps with multiple pillows.  She also states that she has had a decreased appetite which she only noticed about 3 days prior to admission and she is sleeping more than usual and has lost interest and doing some the activities she normally does.   Of note, patient states that she had a "head cold" about 2 weeks ago for which she took a COVID test that was initially positive however repeat test was negative.  She had no cough, fever, or sneezing.   Patient is anuric but has  not missed an HD session recently, with her last session about 2 days prior to admission. She does state that she regularly stops her HD sessions short of completion as she is usually uncomfortable.  She most recently was paused them because she was experiencing a headache with hemodialysis.  She also notes that between her hemodialysis sessions she typically requires oxygen at her HD sessions because of the fluid buildup.   Patient denies chest pain.    Last saw PCP Durene Fruits NP, 04/2022 for hospital f/u    Meds:  Current Facility-Administered Medications on File Prior to Encounter  Medication Dose Route Frequency Provider Last Rate Last Admin   0.9 %  sodium chloride infusion  250 mL Intravenous PRN Serafina Mitchell, MD       sodium chloride flush (NS) 0.9 % injection 3 mL  3 mL Intravenous Q12H Serafina Mitchell, MD       sodium chloride flush (NS) 0.9 % injection 3 mL  3 mL Intravenous PRN Serafina Mitchell, MD       Current Outpatient Medications on File Prior to Encounter  Medication Sig Dispense Refill   acetaminophen (TYLENOL) 650 MG CR tablet Take 1,300 mg by mouth every 8 (eight) hours as needed for pain.  carvedilol (COREG) 12.5 MG tablet Take 12.5 mg by mouth daily.     cinacalcet (SENSIPAR) 30 MG tablet Take 30 mg by mouth every evening.     diphenhydrAMINE (BENADRYL) 25 MG tablet Take 1 tablet (25 mg total) by mouth every 4 (four) hours as needed for itching or allergies. (Patient taking differently: Take 25 mg by mouth 2 (two) times daily.) 30 tablet 0   doxercalciferol (HECTOROL) 4 MCG/2ML injection Use as recommended by nephrology (Patient taking differently: 4 mcg Every Tuesday,Thursday,and Saturday with dialysis. Use as recommended by nephrology) 2 mL    hydrOXYzine (ATARAX/VISTARIL) 10 MG tablet Take 1 tablet (10 mg total) by mouth 3 (three) times daily as needed for anxiety. (Patient not taking: Reported on 12/15/2021) 5 tablet 0   loperamide (IMODIUM A-D) 2 MG tablet  Take 4 mg by mouth daily as needed for diarrhea or loose stools. Dialysis days     methocarbamol (ROBAXIN) 500 MG tablet Take 1 tablet (500 mg total) by mouth every 6 (six) hours as needed for muscle spasms. 30 tablet 2   multivitamin (RENA-VIT) TABS tablet Take 1 tablet by mouth daily.     Nutritional Supplements (NOVASOURCE RENAL) LIQD Take 237 mLs by mouth See admin instructions. Take 1 container (237 mls) by at bedtime on dialysis days (Tuesday, Thursday, Saturday)     ondansetron (ZOFRAN-ODT) 4 MG disintegrating tablet Take 1 tablet (4 mg total) by mouth every 8 (eight) hours as needed for nausea or vomiting. 20 tablet 0   pantoprazole (PROTONIX) 40 MG tablet Take 1 tablet (40 mg total) by mouth 2 (two) times daily before a meal. (Patient not taking: Reported on 04/12/2022) 60 tablet 0   sevelamer carbonate (RENVELA) 800 MG tablet Take 2,400 mg by mouth 3 (three) times daily.      Patient does report that she is intermittently adherent with her medications including her phos binder and carvedilol.   Allergies: Allergies as of 11/30/2022 - Review Complete 11/30/2022  Allergen Reaction Noted   Cephalosporins Rash 01/23/2021   Tobramycin sulfate Swelling 07/05/2012   Vancomycin Swelling 04/16/2022   Past Medical History:  Diagnosis Date   Anemia    low iron - receives iron at dialysis   Anxiety    Arthritis    RA   Chronic systolic congestive heart failure ([place]) 03/16/2016   Dyspnea    ESRD (end stage renal disease) ([place])    Hemo TTHSAT _ [place]   H/O pericarditis 01/17/2013   H/O pleural effusion 01/17/2013   Heart murmur    Lupus (systemic lupus erythematosus) (HCC)    Previously followed with Dr. 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 pneumonae bacteremia on 01/21/2013. Sensitive to Peniccilin     Family History:  History reviewed. No pertinent family history.   Social History:  Social History   Tobacco Use   Smoking status: Every Day    Packs/day: 0.12    Years: 15.00    Total pack years: 1.80    Types: Cigarettes  Passive exposure: Current   Smokeless tobacco: Never   Tobacco comments:    4 cigarettes per day  Vaping Use   Vaping Use: Never used  Substance Use Topics   Alcohol use: Yes    Alcohol/week: 0.0 standard drinks of alcohol    Comment: Special Occasional takes Vicar   Drug use: Yes    Types: Marijuana    Comment: ocassional, last time- 12/21/20  Occasional cocaine use, last used about 2 weeks ago.   Review of Systems: A complete ROS was negative except as per HPI.   Physical Exam: Blood pressure 120/76, pulse 100, temperature 97.7 F (36.5 C), resp. rate (!) 25, height 5\' 4"  (1.626 m), weight 48 kg, SpO2 100 %.  Constitutional: chronically ill appearing, thin, and in no distress.   Cardiovascular: tachycardic rate, regular rhythm, intact distal pulses. No gallop and no friction rub. SEM, LLSB, No lower extremity edema. JVP to the angle of the mandible  Pulmonary: Non labored breathing on 1L [place], no wheezing, rales at bilateral bases  Abdominal: Soft. Normal bowel sounds. Non distended, Mildly TTP in RUQ  Musculoskeletal: Normal range of motion.  R arm AVF w/ palpable thrill, no erythema of overlying skin     General: No tenderness or edema.  Neurological: Alert and oriented to person, place, and time. Non focal  Skin: Skin is warm and dry.    EKG: personally reviewed my interpretation is possible junctional rhythm, inverted t waves in V6, ST depression in V5  CXR:  personally reviewed my interpretation is increased interstitial markings diffusely. No consolidations.   Assessment & Plan by Problem: Principal Problem:   Acute on chronic HFrEF (heart failure with reduced ejection fraction) ([place])  [name] is a 41 year old female with a past medical history significant for ESRD secondary to lupus nephritis on HD Tuesday Thursday Saturday, SLE, HFrEF (EF 30 to 35%, 10/2020), aortic regurgitation, and mitral valve regurgitation who presents with symptoms and signs concerning for acute on chronic HFrEF.   #Acute on chronic HFrEF #Type 2 NSTEMI   Patient reports progressively worsening dyspnea over the last 2-3 weeks. She states that she often ends her HD sessions early but was recently told that she was at her dry weight. Patient also notes that she is intermittently adherent with her medications and also notes intermittent cocaine use. All of which could have precipitated her current exacerbation along with non adherence to dietary restrictions in the setting of her ESRD and HF.   She has JVD up to her jaw and rales on exam. Her BNP is >4500, troponin elevated to 400>428 likely 2/2 HFrEF and ESD, and CXR is with findings concerning for pulmonary edema. All consistent with acute on chronic HFrEF. She last had a TTE 11/202 notable for EF of 30 to 35%, mod AR/MR. It does not appear that patient has had an ischemic evaluation as she is at increased risk of CVD given her SLE and poorly controlled HTN.   Do not think that patient is experiencing active ACS as she denies chest pain, her EKG is without findings c/f acute ischemia, and her troponin levels were flat.   -HD per nephrology  -F/u TTE  -F/u cardiology recommendations  -Patient will need cardiology follow up on discharge, as it does not appear she has established with cardiology outpatient.   #ESRD on HD TThS #AGMA  #Aoc renal disease  ESRD 2/2 lupus nephritis, although it is unclear if her kidneys  have been biopsied. She receives  HD via RUE AVF. Her last session was 2 d prior to admission. She notes some intolerance stating that she becomes nauseous or develops a HA with HD but states this improved after her outpatient nephrologist adjusted her HD treatment. She has a mild metabolic acidosis on labs with likely AGMA (d/t uremia) and NAGMA 2/2 ESRD. Her electrolytes are otherwise WNL. Patient also with likely anemia of chronic renal disease.  -F/u P -F/u iron panel  -Nephrology consulted for HD.   #SLE c/b ?lupus nephritis  Unclear when patient was diagnosed with this or if she follows with rheumatology. An amb referral was sent by her PCP 04/2022. This may need to be resent for patient prior to discharge.   #AR/MR Patient seen by cardiology inpatient 11/05/2020. At that time TEE was discussed but patient stated she would prefer to do that outpatient. It was thought that patient would need valve replacement, but her candidacy was uncertain given her substance use.   #Cocaine use TOC consulted for resources   Dispo: Admit patient to Inpatient with expected length of stay greater than 2 midnights.  Signed: Rick Duff, MD 11/30/2022, 1:31 PM  After 5pm on weekdays and 1pm on weekends: On Call pager: [phone]

## 2022-12-01 ENCOUNTER — Inpatient Hospital Stay (HOSPITAL_COMMUNITY): Payer: Medicaid Other

## 2022-12-01 DIAGNOSIS — S065XAA Traumatic subdural hemorrhage with loss of consciousness status unknown, initial encounter: Secondary | ICD-10-CM | POA: Diagnosis not present

## 2022-12-01 DIAGNOSIS — G935 Compression of brain: Secondary | ICD-10-CM

## 2022-12-01 DIAGNOSIS — I5023 Acute on chronic systolic (congestive) heart failure: Secondary | ICD-10-CM | POA: Diagnosis not present

## 2022-12-01 DIAGNOSIS — R569 Unspecified convulsions: Secondary | ICD-10-CM

## 2022-12-01 DIAGNOSIS — E11649 Type 2 diabetes mellitus with hypoglycemia without coma: Secondary | ICD-10-CM

## 2022-12-01 LAB — IRON AND TIBC
Iron: 192 ug/dL — ABNORMAL HIGH (ref 28–170)
Saturation Ratios: 86 % — ABNORMAL HIGH (ref 10.4–31.8)
TIBC: 224 ug/dL — ABNORMAL LOW (ref 250–450)
UIBC: 32 ug/dL

## 2022-12-01 LAB — MRSA NEXT GEN BY PCR, NASAL: MRSA by PCR Next Gen: NOT DETECTED

## 2022-12-01 LAB — LACTIC ACID, PLASMA
Lactic Acid, Venous: 6.5 mmol/L (ref 0.5–1.9)
Lactic Acid, Venous: 3.7 mmol/L (ref 0.5–1.9)

## 2022-12-01 LAB — RENAL FUNCTION PANEL
Albumin: 2.5 g/dL — ABNORMAL LOW (ref 3.5–5.0)
Anion gap: 20 — ABNORMAL HIGH (ref 5–15)
BUN: 83 mg/dL — ABNORMAL HIGH (ref 6–20)
CO2: 24 mmol/L (ref 22–32)
Calcium: 9.8 mg/dL (ref 8.9–10.3)
Chloride: 95 mmol/L — ABNORMAL LOW (ref 98–111)
Creatinine, Ser: 11.5 mg/dL — ABNORMAL HIGH (ref 0.44–1.00)
GFR, Estimated: 4 mL/min — ABNORMAL LOW (ref >60)
Glucose, Bld: 59 mg/dL — ABNORMAL LOW (ref 70–99)
Phosphorus: 10.7 mg/dL — ABNORMAL HIGH (ref 2.5–4.6)
Potassium: 5.7 mmol/L — ABNORMAL HIGH (ref 3.5–5.1)
Sodium: 139 mmol/L (ref 135–145)

## 2022-12-01 LAB — BASIC METABOLIC PANEL
Anion gap: 23 — ABNORMAL HIGH (ref 5–15)
BUN: 87 mg/dL — ABNORMAL HIGH (ref 6–20)
CO2: 18 mmol/L — ABNORMAL LOW (ref 22–32)
Calcium: 10.5 mg/dL — ABNORMAL HIGH (ref 8.9–10.3)
Chloride: 96 mmol/L — ABNORMAL LOW (ref 98–111)
Creatinine, Ser: 11.51 mg/dL — ABNORMAL HIGH (ref 0.44–1.00)
GFR, Estimated: 4 mL/min — ABNORMAL LOW (ref >60)
Glucose, Bld: 230 mg/dL — ABNORMAL HIGH (ref 70–99)
Potassium: 5.9 mmol/L — ABNORMAL HIGH (ref 3.5–5.1)
Sodium: 137 mmol/L (ref 135–145)

## 2022-12-01 LAB — GLUCOSE, CAPILLARY
Glucose-Capillary: 226 mg/dL — ABNORMAL HIGH (ref 70–99)
Glucose-Capillary: <10 mg/dL — CL (ref 70–99)
Glucose-Capillary: <10 mg/dL — CL (ref 70–99)
Glucose-Capillary: 201 mg/dL — ABNORMAL HIGH (ref 70–99)
Glucose-Capillary: 35 mg/dL — CL (ref 70–99)
Glucose-Capillary: 172 mg/dL — ABNORMAL HIGH (ref 70–99)
Glucose-Capillary: 205 mg/dL — ABNORMAL HIGH (ref 70–99)
Glucose-Capillary: 107 mg/dL — ABNORMAL HIGH (ref 70–99)
Glucose-Capillary: 103 mg/dL — ABNORMAL HIGH (ref 70–99)

## 2022-12-01 LAB — POCT I-STAT 7, (LYTES, BLD GAS, ICA,H+H)
Acid-base deficit: 4 mmol/L — ABNORMAL HIGH (ref 0.0–2.0)
Bicarbonate: 21.5 mmol/L (ref 20.0–28.0)
Calcium, Ion: 1.24 mmol/L (ref 1.15–1.40)
HCT: 34 % — ABNORMAL LOW (ref 36.0–46.0)
Hemoglobin: 11.6 g/dL — ABNORMAL LOW (ref 12.0–15.0)
O2 Saturation: 100 %
Patient temperature: 97.5
Potassium: 5.1 mmol/L (ref 3.5–5.1)
Sodium: 135 mmol/L (ref 135–145)
TCO2: 23 mmol/L (ref 22–32)
pCO2 arterial: 39.9 mmHg (ref 32–48)
pH, Arterial: 7.336 — ABNORMAL LOW (ref 7.35–7.45)
pO2, Arterial: 399 mmHg — ABNORMAL HIGH (ref 83–108)

## 2022-12-01 LAB — CBC
HCT: 34.3 % — ABNORMAL LOW (ref 36.0–46.0)
Hemoglobin: 10.9 g/dL — ABNORMAL LOW (ref 12.0–15.0)
MCH: 29.3 pg (ref 26.0–34.0)
MCHC: 31.8 g/dL (ref 30.0–36.0)
MCV: 92.2 fL (ref 80.0–100.0)
Platelets: 233 10*3/uL (ref 150–400)
RBC: 3.72 MIL/uL — ABNORMAL LOW (ref 3.87–5.11)
RDW: 16.4 % — ABNORMAL HIGH (ref 11.5–15.5)
WBC: 5.1 10*3/uL (ref 4.0–10.5)
nRBC: 0.8 % — ABNORMAL HIGH (ref 0.0–0.2)

## 2022-12-01 LAB — FERRITIN: Ferritin: 1129 ng/mL — ABNORMAL HIGH (ref 11–307)

## 2022-12-01 MED ORDER — SODIUM ZIRCONIUM CYCLOSILICATE 10 G PO PACK: 10.0000 g | PACK | Freq: Every day | ORAL | Status: DC

## 2022-12-01 MED ORDER — ETOMIDATE 2 MG/ML IV SOLN
INTRAVENOUS | Status: AC
  Filled 2022-12-01: qty 20

## 2022-12-01 MED ORDER — DEXTROSE-NACL 5-0.9 % IV SOLN: INTRAVENOUS | Status: DC

## 2022-12-01 MED ORDER — PROPOFOL 1000 MG/100ML IV EMUL
0.0000 ug/kg/min | INTRAVENOUS | Status: DC
  Administered 2022-12-01: 5 ug/kg/min via INTRAVENOUS
  Filled 2022-12-01: qty 100

## 2022-12-01 MED ORDER — LIDOCAINE HCL (PF) 1 % IJ SOLN: 5.0000 mL | INTRAMUSCULAR | Status: DC | PRN

## 2022-12-01 MED ORDER — DOCUSATE SODIUM 100 MG PO CAPS
100.0000 mg | ORAL_CAPSULE | Freq: Two times a day (BID) | ORAL | Status: DC
  Administered 2022-12-02 – 2022-12-05 (×5): 100 mg via ORAL
  Filled 2022-12-01 (×6): qty 1

## 2022-12-01 MED ORDER — CHLORHEXIDINE GLUCONATE CLOTH 2 % EX PADS
6.0000 | MEDICATED_PAD | Freq: Every day | CUTANEOUS | Status: DC
  Administered 2022-12-03 – 2022-12-06 (×4): 6 via TOPICAL

## 2022-12-01 MED ORDER — ETOMIDATE 2 MG/ML IV SOLN
20.0000 mg | Freq: Once | INTRAVENOUS | Status: AC
  Administered 2022-12-01: 20 mg via INTRAVENOUS

## 2022-12-01 MED ORDER — LIDOCAINE-PRILOCAINE 2.5-2.5 % EX CREA
1.0000 | TOPICAL_CREAM | CUTANEOUS | Status: DC | PRN
  Filled 2022-12-01: qty 5

## 2022-12-01 MED ORDER — LEVETIRACETAM IN NACL 500 MG/100ML IV SOLN: 500.0000 mg | Freq: Two times a day (BID) | INTRAVENOUS | Status: DC

## 2022-12-01 MED ORDER — MELATONIN 3 MG PO TABS
3.0000 mg | ORAL_TABLET | Freq: Every evening | ORAL | Status: DC | PRN
  Administered 2022-12-02 (×2): 3 mg via ORAL
  Filled 2022-12-01 (×2): qty 1

## 2022-12-01 MED ORDER — SEVELAMER CARBONATE 800 MG PO TABS
3200.0000 mg | ORAL_TABLET | Freq: Three times a day (TID) | ORAL | Status: DC
  Administered 2022-12-02 – 2022-12-06 (×9): 3200 mg via ORAL
  Filled 2022-12-01 (×14): qty 4

## 2022-12-01 MED ORDER — ROCURONIUM BROMIDE 10 MG/ML (PF) SYRINGE
PREFILLED_SYRINGE | INTRAVENOUS | Status: AC
  Administered 2022-12-01: 50 mg via INTRAVENOUS
  Filled 2022-12-01: qty 10

## 2022-12-01 MED ORDER — NEPRO/CARBSTEADY PO LIQD
237.0000 mL | Freq: Three times a day (TID) | ORAL | Status: DC
  Administered 2022-12-02 – 2022-12-06 (×11): 237 mL via ORAL
  Filled 2022-12-01 (×3): qty 237

## 2022-12-01 MED ORDER — LEVETIRACETAM IN NACL 500 MG/100ML IV SOLN
500.0000 mg | Freq: Two times a day (BID) | INTRAVENOUS | Status: DC
  Administered 2022-12-02 (×2): 500 mg via INTRAVENOUS
  Filled 2022-12-01 (×2): qty 100

## 2022-12-01 MED ORDER — ADULT MULTIVITAMIN W/MINERALS CH
1.0000 | ORAL_TABLET | Freq: Every day | ORAL | Status: DC
  Administered 2022-12-02: 1 via ORAL
  Filled 2022-12-01: qty 1

## 2022-12-01 MED ORDER — DOCUSATE SODIUM 50 MG/5ML PO LIQD
100.0000 mg | Freq: Two times a day (BID) | ORAL | Status: DC
  Administered 2022-12-01: 100 mg
  Filled 2022-12-01: qty 10

## 2022-12-01 MED ORDER — MIDAZOLAM HCL 2 MG/2ML IJ SOLN: 1.0000 mg | Freq: Once | INTRAMUSCULAR | Status: AC

## 2022-12-01 MED ORDER — POLYETHYLENE GLYCOL 3350 17 G PO PACK: 17.0000 g | PACK | Freq: Every day | ORAL | Status: DC

## 2022-12-01 MED ORDER — DEXTROSE 10 % IV SOLN: INTRAVENOUS | Status: DC

## 2022-12-01 MED ORDER — PENTAFLUOROPROP-TETRAFLUOROETH EX AERO: 1.0000 | INHALATION_SPRAY | CUTANEOUS | Status: DC | PRN

## 2022-12-01 MED ORDER — LEVETIRACETAM IN NACL 500 MG/100ML IV SOLN
500.0000 mg | Freq: Two times a day (BID) | INTRAVENOUS | Status: DC
  Administered 2022-12-01: 500 mg via INTRAVENOUS
  Filled 2022-12-01: qty 100

## 2022-12-01 MED ORDER — LORAZEPAM 2 MG/ML IJ SOLN
INTRAMUSCULAR | Status: AC
  Filled 2022-12-01: qty 1

## 2022-12-01 MED ORDER — SEVELAMER CARBONATE 800 MG PO TABS
3200.0000 mg | ORAL_TABLET | Freq: Three times a day (TID) | ORAL | Status: DC
  Administered 2022-12-01: 3200 mg
  Filled 2022-12-01 (×3): qty 4

## 2022-12-01 MED ORDER — FENTANYL CITRATE PF 50 MCG/ML IJ SOSY: 50.0000 ug | PREFILLED_SYRINGE | Freq: Once | INTRAMUSCULAR | Status: AC

## 2022-12-01 MED ORDER — LOPERAMIDE HCL 2 MG PO CAPS
4.0000 mg | ORAL_CAPSULE | ORAL | Status: DC | PRN
  Administered 2022-12-01 – 2022-12-04 (×2): 4 mg via ORAL
  Filled 2022-12-01 (×2): qty 2

## 2022-12-01 MED ORDER — MIDAZOLAM HCL 2 MG/2ML IJ SOLN
1.0000 mg | Freq: Once | INTRAMUSCULAR | Status: AC
  Administered 2022-12-01: 1 mg via INTRAVENOUS

## 2022-12-01 MED ORDER — ORAL CARE MOUTH RINSE: 15.0000 mL | OROMUCOSAL | Status: DC | PRN

## 2022-12-01 MED ORDER — ADULT MULTIVITAMIN LIQUID CH
15.0000 mL | Freq: Every day | ORAL | Status: DC
  Administered 2022-12-01: 15 mL
  Filled 2022-12-01: qty 15

## 2022-12-01 MED ORDER — SODIUM ZIRCONIUM CYCLOSILICATE 10 G PO PACK
10.0000 g | PACK | Freq: Every day | ORAL | Status: AC
  Administered 2022-12-01: 10 g
  Filled 2022-12-01: qty 1

## 2022-12-01 MED ORDER — MIDAZOLAM HCL 2 MG/2ML IJ SOLN
INTRAMUSCULAR | Status: AC
  Administered 2022-12-01: 1 mg via INTRAVENOUS
  Filled 2022-12-01: qty 2

## 2022-12-01 MED ORDER — DEXTROSE 50 % IV SOLN
25.0000 g | INTRAVENOUS | Status: AC
  Administered 2022-12-01: 25 g via INTRAVENOUS

## 2022-12-01 MED ORDER — ROCURONIUM BROMIDE 10 MG/ML (PF) SYRINGE: 50.0000 mg | PREFILLED_SYRINGE | Freq: Once | INTRAVENOUS | Status: AC

## 2022-12-01 MED ORDER — FENTANYL CITRATE PF 50 MCG/ML IJ SOSY
PREFILLED_SYRINGE | INTRAMUSCULAR | Status: AC
  Administered 2022-12-01: 50 ug via INTRAVENOUS
  Filled 2022-12-01: qty 2

## 2022-12-01 MED ORDER — DEXTROSE 50 % IV SOLN
INTRAVENOUS | Status: AC
  Filled 2022-12-01: qty 50

## 2022-12-01 NOTE — Progress Notes (Signed)
Imaging results reviewed with Dr. Quinn Axe; will call NSGY.  Erskine Emery MD PCCM

## 2022-12-01 NOTE — Progress Notes (Signed)
vLTm set up  all impedances below 10kohms  [facility] notified.  Patient event button tested

## 2022-12-01 NOTE — Progress Notes (Signed)
Finger stick CBG result at 2001 was a critical level of 35. E-link was contacted for this critical result and pt request for anxiety medication.   Orders for 1 amp D50 and an infusion of D5 and 0.9 NS at 80mL/hr were received.   D50 was given and 15 minute recheck CBG was 205.  Will continue with q1hr CBG checks.   Will check back with [facility] team for anxiety treatment once CBGs are WDL.   12/01/2022 Mike Gip, RN 9:42 PM

## 2022-12-01 NOTE — Consult Note (Signed)
NAME:  [name], MRN:  [id number], DOB:  [birth year], LOS: 1 ADMISSION DATE:  11/30/2022, CONSULTATION DATE:  12/01/22 REFERRING MD:  Graciella Freer, CHIEF COMPLAINT:  SOB   History of Present Illness:  41 year old woman with hx of lupus nephritis- induced ESRD, HFrEF, vavlular heart disease (aortic and mitral insufficiency) who presented with worsening SOB for several days.  Diagnosed as AECHF.  This AM found down on floor and had ~8 min seizure ablated with ativan.  CBG noted very low question proximal cause.  Now very somnolent, PCCM consulted for further management.  Pertinent  Medical History   Past Medical History:  Diagnosis Date   Anemia    low iron - receives iron at dialysis   Anxiety    Arthritis    RA   Chronic systolic congestive heart failure ([place]) 03/16/2016   Dyspnea    ESRD (end stage renal disease) ([place])    Hemo TTHSAT _ [place]   H/O pericarditis 01/17/2013   H/O pleural effusion 01/17/2013   Heart murmur    Lupus (systemic lupus erythematosus) (HCC)    Previously followed with Dr. Charlestine Night, has not followed up recently   [facility] 2006   Renal biopsy shows segmental endocapillary proliferation and cellular crescent formation (Class IIIA) and lupus membranous glomerulopathy (Class V, stage II)   Pneumonia    many times   Polysubstance abuse ([place])    cocaine, MJ, tobacco   S/P pericardiocentesis 01/17/2013   H/o pericardial effusion with tamponade 2006    Seizures ([place])    during pregnancy 1 time   Streptococcal bacteremia 01/23/2013   She had two S. pneumonae bacteremia on 01/21/2013. Sensitive to [place] Hospital Events: Including procedures, antibiotic start and stop dates in addition to other pertinent events   12/5 admit  Interim History / Subjective:  Consulted  Objective   Blood pressure (!) 124/57, pulse 97, temperature (!) 97.5 F (36.4 C), temperature source Oral, resp. rate 18, height 5\' 4"  (1.626 m), weight  45.4 kg, SpO2 100 %.        Intake/Output Summary (Last 24 hours) at 12/01/2022 7494 Last data filed at 12/01/2022 0330 Gross per 24 hour  Intake --  Output 3 ml  Net -3 ml   Filed Weights   11/30/22 0800 12/01/22 0558  Weight: 48 kg 45.4 kg    Examination: General: chronically ill cachetic woman in NAD HENT: MM dry, +JVD Lungs: crackles bases, tachypneic shallow resp efforts Cardiovascular: tachy, +murmur, ext warm Abdomen: soft, +BS Extremities: +muscle wasting; RUE fistula in place good thrill Neuro: starting to withdrawal x 4 during exam, pupils equal Skin: chronic renal skin disease  Resolved Hospital Problem list   N/A  Assessment & Plan:  Prolonged seizure, metabolic encephalopathy- sugars low but was found down, unclear if cause or effect of seizure.  Has history of seizures during pregnancy. Tenuous resp status prompting ICU transfer.  Nonfocal exam at present but consider head CT if does not continue to improve.  ESRD, HFrEF, valvular insufficiency with hyperkalemia, volume overload- iHD planned for today  Severe protein calorie malnutrition- POA  Hx SLE- not on DMARDs at present  - Monitor for need for airway, hopefully can avoid - Close CBG checks, D10 PRN - cEEG, neuro consult, AEDs at their discretion - Seizure precautions, NPO for now - iHD as negative as tolerated - Will take over for today  Best Practice (right click and "Reselect all SmartList  Selections" daily)   Diet/type: NPO DVT prophylaxis: prophylactic heparin  GI prophylaxis: N/A Lines: N/A Foley:  N/A Code Status:  full code Last date of multidisciplinary goals of care discussion [Mother notified by IMTS]  Labs   CBC: Recent Labs  Lab 11/30/22 0806 12/01/22 0330  WBC 5.2 5.1  HGB 11.2* 10.9*  HCT 35.1* 34.3*  MCV 94.6 92.2  PLT 236 983    Basic Metabolic Panel: Recent Labs  Lab 11/30/22 0806 11/30/22 1147 11/30/22 1505 12/01/22 0330  NA 137  --   --  139  K 5.1  --    --  5.7*  CL 96*  --   --  95*  CO2 17*  --   --  24  GLUCOSE 76  --   --  59*  BUN 66*  --   --  83*  CREATININE 9.66*  --   --  11.50*  CALCIUM 9.8  --   --  9.8  MG  --  2.7*  --   --   PHOS  --   --  9.5* 10.7*   GFR: Estimated Creatinine Clearance: 4.8 mL/min (A) (by C-G formula based on SCr of 11.5 mg/dL (H)). Recent Labs  Lab 11/30/22 0806 12/01/22 0330  WBC 5.2 5.1    Liver Function Tests: Recent Labs  Lab 12/01/22 0330  ALBUMIN 2.5*   No results for input(s): "LIPASE", "AMYLASE" in the last 168 hours. No results for input(s): "AMMONIA" in the last 168 hours.  ABG    Component Value Date/Time   PHART 7.449 06/30/2018 1603   PCO2ART 48.8 (H) 06/30/2018 1603   PO2ART 238.0 (H) 06/30/2018 1603   HCO3 33.8 (H) 06/30/2018 1603   TCO2 26 02/18/2021 0937   ACIDBASEDEF 8.0 (H) 06/30/2018 1327   O2SAT 100.0 06/30/2018 1603     Coagulation Profile: No results for input(s): "INR", "PROTIME" in the last 168 hours.  Cardiac Enzymes: No results for input(s): "CKTOTAL", "CKMB", "CKMBINDEX", "TROPONINI" in the last 168 hours.  HbA1C: No results found for: "HGBA1C"  CBG: Recent Labs  Lab 12/01/22 0714 12/01/22 0719  GLUCAP <10* <10*    Review of Systems:   Postictal  Past Medical History:  She,  has a past medical history of Anemia, Anxiety, Arthritis, Chronic systolic congestive heart failure ([place]) (03/16/2016), Dyspnea, ESRD (end stage renal disease) ([place]), H/O pericarditis (01/17/2013), H/O pleural effusion (01/17/2013), Heart murmur, Lupus (systemic lupus erythematosus) ([place]), Lupus nephritis ([place]) (2006), Pneumonia, Polysubstance abuse ([place]), S/P pericardiocentesis (01/17/2013), Seizures ([place]), and Streptococcal bacteremia (01/23/2013).   Surgical History:   Past Surgical History:  Procedure Laterality Date   AV FISTULA PLACEMENT     AV FISTULA PLACEMENT Right 08/01/2020   Procedure: RIGHT ARM BRACHIOCEPHALIC  ARTERIOVENOUS (AV) FISTULA CREATION;  Surgeon:  [name], MD;  Location: [facility];  Service: Vascular;  Laterality: Right;   [place] Left 02/05/2014   Procedure: [place];  Surgeon: [name], MD;  Location: [facility];  Service: Vascular;  Laterality: Left;   [place] Right 01/08/2021   Procedure: RIGHT ARM SECOND STAGE [place];  Surgeon: [name], MD;  Location: [facility];  Service: Vascular;  Laterality: Right;   BIOPSY  12/17/2021   Procedure: BIOPSY;  Surgeon: Irving Copas., MD;  Location: [facility];  Service: Gastroenterology;;   ESOPHAGOGASTRODUODENOSCOPY (EGD) WITH PROPOFOL N/A 12/17/2021   Procedure: ESOPHAGOGASTRODUODENOSCOPY (EGD) WITH PROPOFOL;  Surgeon: Irving Copas., MD;  [facility]  ENDOSCOPY;  Service: Gastroenterology;  Laterality: N/A;   FISTULA SUPERFICIALIZATION Left 05/30/2018   Procedure: FISTULA PLICATION BASILIC VEIN TRANSPOSITION;  Surgeon: Angelia Mould, MD;  Location: [facility];  Service: Vascular;  Laterality: Left;   FISTULA SUPERFICIALIZATION Left 11/94/1740   Procedure: PLICATION OF LEFT ARTERIOVENOUS FISTULA ULCER;  Surgeon: [name], MD;  Location: [facility];  Service: Vascular;  Laterality: Left;   I & D EXTREMITY Right 02/18/2021   Procedure: IRRIGATION AND DEBRIDEMENT OF ARM;  Surgeon: Angelia Mould, MD;  Location: [place];  Service: Vascular;  Laterality: Right;   INSERTION OF DIALYSIS CATHETER N/A 05/19/2020   Procedure: TUNNELED INSERTION  OF DIALYSIS CATHETER;  Surgeon: Waynetta Sandy, MD;  Location: [place];  Service: Vascular;  Laterality: N/A;   THROMBECTOMY AND REVISION OF ARTERIOVENTOUS (AV) GORETEX  GRAFT Left 07/28/2020   Procedure: Oversewing of left arm Brachial cephalic fistula for bleeding.;  Surgeon: Angelia Mould, MD;  Location: [facility];  Service: Vascular;  Laterality: Left;   VENOGRAM Right 01/31/2014   Procedure: DIALYSIS CATHETER;  Surgeon: Serafina Mitchell, MD;   Location: [facility] CATH LAB;  Service: Cardiovascular;  Laterality: Right;     Social History:   reports that she has been smoking cigarettes. She has a 1.80 pack-year smoking history. She has been exposed to tobacco smoke. She has never used smokeless tobacco. She reports current alcohol use. She reports current drug use. Drug: Marijuana.   Family History:  Her family history is not on file.   Allergies Allergies  Allergen Reactions   Cephalosporins Rash    To both keflex and cefazolin   Tobramycin Sulfate Swelling    Eye swelling   Vancomycin Swelling     Home Medications  Prior to Admission medications   Medication Sig Start Date End Date Taking? Authorizing Provider  acetaminophen (TYLENOL) 650 MG CR tablet Take 1,300 mg by mouth every 8 (eight) hours as needed for pain.   Yes [provider]  carvedilol (COREG) 12.5 MG tablet Take 12.5 mg by mouth daily. 11/07/19  Yes [provider]  cinacalcet (SENSIPAR) 30 MG tablet Take 30 mg by mouth every evening. 11/24/22  Yes [provider]  diphenhydrAMINE (BENADRYL) 25 MG tablet Take 1 tablet (25 mg total) by mouth every 4 (four) hours as needed for itching or allergies. Patient taking differently: Take 25 mg by mouth 2 (two) times daily. 01/23/21  Yes Lavina Hamman, MD  doxercalciferol (HECTOROL) 4 MCG/2ML injection Use as recommended by nephrology Patient taking differently: 4 mcg Every Tuesday,Thursday,and Saturday with dialysis. Use as recommended by nephrology 11/05/20  Yes Hosie Poisson, MD  loperamide (IMODIUM A-D) 2 MG tablet Take 4 mg by mouth daily as needed for diarrhea or loose stools. Dialysis days   Yes [provider]  multivitamin (RENA-VIT) TABS tablet Take 1 tablet by mouth daily.   Yes [provider]  Nutritional Supplements (NOVASOURCE RENAL) LIQD Take 237 mLs by mouth See admin instructions. Take 1 container (237 mls) by at bedtime on dialysis days (Tuesday, Thursday,  Saturday)   Yes [provider]  sevelamer carbonate (RENVELA) 800 MG tablet Take 2,400 mg by mouth 3 (three) times daily. 02/10/22  Yes [provider]  ondansetron (ZOFRAN-ODT) 4 MG disintegrating tablet Take 1 tablet (4 mg total) by mouth every 8 (eight) hours as needed for nausea or vomiting. Patient not taking: Reported on 11/30/2022 12/18/21   Concepcion Living, MD  pantoprazole (PROTONIX) 40 MG tablet  Take 1 tablet (40 mg total) by mouth 2 (two) times daily before a meal. Patient not taking: Reported on 04/12/2022 12/18/21 01/17/22  Concepcion Living, MD     Critical care time: 33 minutes

## 2022-12-01 NOTE — Progress Notes (Signed)
   12/01/22 1615  Vitals  Temp 98.3 F (36.8 C)  Temp Source Oral  BP 126/66  MAP (mmHg) 84  BP Location Left Arm  BP Method Automatic  Patient Position (if appropriate) Lying  Pulse Rate 83  Pulse Rate Source Monitor  ECG Heart Rate 85  Resp 19  Oxygen Therapy  SpO2 96 %  O2 Device Ventilator  Patient Activity (if Appropriate) In bed  Pulse Oximetry Type Continuous  During Treatment Monitoring  Blood Flow Rate (mL/min) 200 mL/min  HD Safety Checks Performed Yes  Intra-Hemodialysis Comments Tx completed  Post Treatment  Dialyzer Clearance Lightly streaked  Duration of HD Treatment -hour(s) 3 hour(s)  Hemodialysis Intake (mL) 0 mL  Liters Processed 72  Fluid Removed (mL) 3000 mL  Tolerated HD Treatment Yes  Post-Hemodialysis Comments hd tx completed. no complications.  AVG/AVF Arterial Site Held (minutes) 10 minutes  AVG/AVF Venous Site Held (minutes) 10 minutes  Fistula / Graft Right Upper arm  Placement Date/Time: 12/18/19 (c) 1900   Placed prior to admission: Yes  Orientation: Right  Access Location: Upper arm  Site Condition No complications  Fistula / Graft Assessment Present;Thrill;Bruit  Status Deaccessed  Needle Size 15  Drainage Description None

## 2022-12-01 NOTE — Progress Notes (Signed)
Getting pretty agitated and awake now. No immediate operative plans. Either needs heavy/high sedation and pressors or extubation. Elected for latter.  Erskine Emery MD PCCM

## 2022-12-01 NOTE — Consult Note (Addendum)
Neurology Consultation  Reason for Consult: Hypoglycemic seizure with postictal state Referring Physician: Dr. Tamala Julian  CC: None  History is obtained from: Chart and nursing  HPI: [name] is a 41 y.o. female with history of end-stage renal disease on dialysis, lupus, CHF, aortic regurgitation mitral valve regurgitation, nonadherence to medical regimen as well as occasional drug use who originally presented with increasing dyspnea and had an 8-minute seizure this morning, possibly due to hypoglycemia.  Fingerstick glucose was noted to be less than 10 at the time patient had her seizure, but the accuracy of this was called into question due to presence of an AV fistula on both arms.  Patient was noted to be hypoglycemic on her basic metabolic panel this morning with a glucose of 59 seen at 3:30 AM.  Patient was treated for the seizure with 2 mg of Ativan, and seizure activity ceased she was also given 2 A of D50.  Blood glucose was then noted to be in the 200s.  On exam, patient appeared postictal, did not respond to voice or follow commands and did not move left arm.  Patient was intubated for airway protection, and stat CT head was obtained demonstrating 19 mm subdural collection on the right, acute on chronic subdural hematoma.  ROS:  Unable to obtain due to altered mental status.   Past Medical History:  Diagnosis Date   Anemia    low iron - receives iron at dialysis   Anxiety    Arthritis    RA   Chronic systolic congestive heart failure (HCC) 03/16/2016   Dyspnea    ESRD (end stage renal disease) ([place])    [facility] _ [place]   H/O pericarditis 01/17/2013   H/O pleural effusion 01/17/2013   Heart murmur    Lupus (systemic lupus erythematosus) (HCC)    Previously followed with Dr. 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 pneumonae bacteremia on 01/21/2013. Sensitive to Peniccilin      History reviewed. No pertinent family history.   Social History:   reports that she has been smoking cigarettes. She has a 1.80 pack-year smoking history. She has been exposed to tobacco smoke. She has never used smokeless tobacco. She reports current alcohol use. She reports current drug use. Drug: Marijuana.  Medications  Current Facility-Administered Medications:    acetaminophen (TYLENOL) tablet 650 mg, 650 mg, Oral, Q6H PRN, 650 mg at 11/30/22 2129 **OR** acetaminophen (TYLENOL) suppository 650 mg, 650 mg, Rectal, Q6H PRN, Rick Duff, MD   albuterol (PROVENTIL) (2.5 MG/3ML) 0.083% nebulizer solution 2.5 mg, 2.5 mg, Nebulization, Q2H PRN, Charise Killian, MD   camphor-menthol Timoteo Ace) lotion, , Topical, PRN, Rick Duff, MD   Chlorhexidine Gluconate Cloth 2 % PADS 6 each, 6 each, Topical, Daily, Candee Furbish, MD   dextrose 10 % infusion, , Intravenous, Continuous, Candee Furbish, MD, Held at 12/01/22 1020   docusate (COLACE) 50 MG/5ML liquid 100 mg, 100 mg, Per Tube, BID, Margaretha Seeds, MD   lidocaine (PF) (XYLOCAINE) 1 %  injection 5 mL, 5 mL, Intradermal, PRN, Loren Racer, PA-C   lidocaine-prilocaine (EMLA) cream 1 Application, 1 Application, Topical, PRN, Loren Racer, PA-C   loperamide (IMODIUM) capsule 4 mg, 4 mg, Oral, PRN, Iona Coach, MD, 4 mg at 12/01/22 8295   multivitamin liquid 15 mL, 15 mL, Per Tube, Daily, Candee Furbish, MD, 15 mL at 12/01/22 1116   nicotine (NICODERM CQ - dosed in mg/24 hours) patch 14 mg, 14 mg, Transdermal, Daily, Rick Duff, MD, 14 mg at 12/01/22 1108    pentafluoroprop-tetrafluoroeth (GEBAUERS) aerosol 1 Application, 1 Application, Topical, PRN, Loren Racer, PA-C   polyethylene glycol (MIRALAX / GLYCOLAX) packet 17 g, 17 g, Per Tube, Daily, Margaretha Seeds, MD   propofol (DIPRIVAN) 1000 MG/100ML infusion, 0-50 mcg/kg/min, Intravenous, Continuous, Margaretha Seeds, MD, Last Rate: 1.36 mL/hr at 12/01/22 1200, 5 mcg/kg/min at 12/01/22 1200   sevelamer carbonate (RENVELA) tablet 3,200 mg, 3,200 mg, Per Tube, TID WC, Candee Furbish, MD, 3,200 mg at 12/01/22 1107   Exam: Current vital signs: BP (!) 130/52   Pulse 82   Temp (!) 96.1 F (35.6 C) (Axillary) Comment: Reported to the nurse, warm blankets applied  Resp 20   Ht 5\' 4"  (1.626 m)   Wt 45.4 kg   LMP  (Approximate)   SpO2 93%   BMI 17.18 kg/m  Vital signs in last 24 hours: Temp:  [96.1 F (35.6 C)-97.7 F (36.5 C)] 96.1 F (35.6 C) (12/05 1200) Pulse Rate:  [68-99] 82 (12/05 1200) Resp:  [10-28] 20 (12/05 1200) BP: (92-167)/(46-119) 130/52 (12/05 1200) SpO2:  [92 %-100 %] 93 % (12/05 1200) FiO2 (%):  [60 %-100 %] 60 % (12/05 1126) Weight:  [45.4 kg] 45.4 kg (12/05 0558)  GENERAL: Eyes closed, does not respond to voice, localizes noxious stimuli with right arm only, does not follow commands Head: Normocephalic and atraumatic, without obvious abnormality EENT: Normal conjunctivae, moist mucous membranes, no OP obstruction LUNGS: Snoring respirations noted Extremities: warm, well perfused, without obvious deformity  NEURO: (pre-intubation ) Patient is noted to be postictal with eyes closed, does not follow commands or respond to voice, localizes noxious stimuli with right arm only.  Does not respond to noxious stimuli in extremities.  Oculocephalic reflex intact but does not focus or track  On post intubation exam, pupils equal round and sluggishly reactive, no oculocephalic reflex but will move eyes slightly when head is turned, cough intact, patient still does not  follow commands or respond to voice, will move all extremities nonpurposefully and moves all extremities nonpurposefully to noxious stimuli, withdraws bilateral lower extremities to noxious stimuli but does not move upper extremities to noxious stimuli  Labs I have reviewed labs in epic and the results pertinent to this consultation are:   CBC    Component Value Date/Time   WBC 5.1 12/01/2022 0330   RBC 3.72 (L) 12/01/2022 0330   HGB 11.6 (L) 12/01/2022 1025   HCT 34.0 (L) 12/01/2022 1025   PLT 233 12/01/2022 0330   MCV 92.2 12/01/2022 0330   MCH 29.3 12/01/2022 0330   MCHC 31.8 12/01/2022 0330   RDW 16.4 (H) 12/01/2022 0330   LYMPHSABS 1.0 11/05/2022 0956   MONOABS 0.5 11/05/2022 0956   EOSABS 0.0 11/05/2022 0956   BASOSABS 0.0 11/05/2022 0956    CMP     Component Value Date/Time   NA 135 12/01/2022 1025   K 5.1 12/01/2022 1025   CL 96 (L) 12/01/2022 0747   CO2  18 (L) 12/01/2022 0747   GLUCOSE 230 (H) 12/01/2022 0747   BUN 87 (H) 12/01/2022 0747   CREATININE 11.51 (H) 12/01/2022 0747   CALCIUM 10.5 (H) 12/01/2022 0747   CALCIUM 6.7 (L) 01/31/2014 1309   PROT 7.9 11/05/2022 0956   ALBUMIN 2.5 (L) 12/01/2022 0330   AST 19 11/05/2022 0956   ALT 17 11/05/2022 0956   ALKPHOS 109 11/05/2022 0956   BILITOT 0.3 11/05/2022 0956   GFRNONAA 4 (L) 12/01/2022 0747   GFRAA 9 (L) 08/02/2020 1049    Lipid Panel     Component Value Date/Time   CHOL 190 01/30/2014 0616   TRIG 105 06/30/2018 0808   HDL 25 (L) 01/30/2014 0616   CHOLHDL 7.6 01/30/2014 0616   VLDL 49 (H) 01/30/2014 0616   LDLCALC 116 (H) 01/30/2014 0616     Imaging I have reviewed the images obtained:  CT-scan of the brain: 19 mm collection in right cerebral hemisphere with 2 mm midline shift, acute on chronic subdural hematoma   Assessment: 41 year old patient with history of end-stage renal disease, CHF, lupus, aortic regurgitation and mitral valve regurgitation was initially admitted for dyspnea and was  found to have an 8-minute seizure this morning, likely due to hypoglycemia.  She was found on the floor at the time this happened with an unwitnessed fall.  Seizure terminated with administration of Ativan and dextrose.  Patient was intubated for airway protection.  On exam, patient was noted to be postictal but was not moving left arm, so CT head was obtained revealing right-sided subdural hematoma.  Neurosurgery has been contacted, awaiting recommendations.  Impression: Hypoglycemic seizure with fall and subdural hematoma  Recommendations: -Long-term EEG -Keppra 500mg  bid (ESRD dosing) -Maintain normoglycemia -Neurosurgery consult, awaiting recommendations  Pt seen by NP/Neuro and later by MD. Note/plan to be edited by MD as needed.  El Brazil , MSN, AGACNP-BC [facility] See Amion for schedule and pager information 12/01/2022 12:33 PM   Attending Neurohospitalist Addendum Patient seen and examined with APP/Resident. Agree with the history and physical as documented above. Agree with the plan as documented, which I helped formulate. I have edited the note above to reflect my full findings and recommendations. I have independently reviewed the chart, obtained history, review of systems and examined the patient.I have personally reviewed pertinent head/neck/spine imaging (CT/MRI). Please feel free to call with any questions.  This patient is critically ill and at significant risk of neurological worsening, death and care requires constant monitoring of vital signs, hemodynamics,respiratory and cardiac monitoring, neurological assessment, discussion with family, other specialists and medical decision making of high complexity. I spent 75 minutes of neurocritical care time  in the care of  this patient. This was time spent independent of any time provided by nurse practitioner or PA.  Su Monks, MD [facility] [phone]  If 7pm- 7am, please page  neurology on call as listed in [place].

## 2022-12-01 NOTE — Code Documentation (Addendum)
Code Blue Note  Staff entered patient's room and found her lying in the floor. Staff asked her if she was okay and she said "yes". Patient then noted to have full body tremors. Code blue was called. No ACLS or BLS initiated as patient was noted to have pulse. She was placed on Zoll monitoring and ventilation was assisted via BVM. Pt placed in bed by staff.   Upon RRT arrival, pt continues to have full body tremor. Gaze is noted to be midline, center. 2mg  IV Ativan given. Patient seizure like activity stopped immediately following Ativan administration. Patient opened her eyes and looked around and tried to sit up before becoming somnolent with snorous respiration. SpO2 100%, patient placed on 100% NRB.   25g Dextrose 50% given IV x2 for CBG <10 x2. See code documentation for medications given and times. Follow-up CBG of 227.  Patient transported to ICU. BP 167/46, HR 81, RR 26, SpO2 100% on 100% NRB. Patient with purposeful movement, responding to pain.   Bedside handoff to [place], Therapist, sports.    Fey Coghill L Cosette Prindle Rapid Response RN

## 2022-12-01 NOTE — Procedures (Signed)
Extubation Procedure Note  Patient Details:   Name: [name] DOB: [birth year] MRN: [id number]   Airway Documentation:    Vent end date: 12/01/22 Vent end time: 1724   Evaluation  O2 sats: stable throughout Complications: No apparent complications Patient did tolerate procedure well. Bilateral Breath Sounds: Diminished (coarse)   Yes  RT extubated patient to 4L [place] per MD order with RN at bedside. Positive cuff leak noted. Patient tolerating well at this time no distress or stridor noted. RT will continue to monitor as needed.  Fabiola Backer 12/01/2022, 5:25 PM

## 2022-12-01 NOTE — Progress Notes (Signed)
[facility] Progress Note Patient Name: [name] DOB: February 09, 1981 MRN: [id number]   Date of Service  12/01/2022  HPI/Events of Note  Transferred to ICU this am for seizures and low blood sugar.  Blood sugar this am 0800 201.  Blood glucose now 35 and patient restless.  eICU Interventions  1 amp D50 IV now Low rate glucose infusion CBG hourly     Intervention Category Major Interventions: OtherMauri [place], Mamie Nick 12/01/2022, 8:24 PM

## 2022-12-01 NOTE — Progress Notes (Signed)
Primary Cardiologist:  Buford Dresser  Subjective:  Intubated sedated with EEG monitor on Events this am noted   Objective:  Vitals:   12/01/22 0735 12/01/22 0740 12/01/22 0800 12/01/22 0904  BP: (!) 139/119 (!) 167/46 (!) 136/53   Pulse: 84 81 78   Resp: (!) 25 (!) 26 (!) 28   Temp:      TempSrc:      SpO2: 100% 100% 98% 100%  Weight:      Height:        Intake/Output from previous day:  Intake/Output Summary (Last 24 hours) at 12/01/2022 0932 Last data filed at 12/01/2022 0330 Gross per 24 hour  Intake --  Output 3 ml  Net -3 ml    Physical Exam: Chronically ill black female EEG leads on Intubated sedated propofol SEM/AR/MR murmurs Fistula RUE Abdomen benign No edema  Lab Results: Basic Metabolic Panel: Recent Labs    11/30/22 1147 11/30/22 1505 12/01/22 0330 12/01/22 0747  NA  --   --  139 137  K  --   --  5.7* 5.9*  CL  --   --  95* 96*  CO2  --   --  24 18*  GLUCOSE  --   --  59* 230*  BUN  --   --  83* 87*  CREATININE  --   --  11.50* 11.51*  CALCIUM  --   --  9.8 10.5*  MG 2.7*  --   --   --   PHOS  --  9.5* 10.7*  --    Liver Function Tests: Recent Labs    12/01/22 0330  ALBUMIN 2.5*   No results for input(s): "LIPASE", "AMYLASE" in the last 72 hours. CBC: Recent Labs    11/30/22 0806 12/01/22 0330  WBC 5.2 5.1  HGB 11.2* 10.9*  HCT 35.1* 34.3*  MCV 94.6 92.2  PLT 236 233    Anemia Panel: Recent Labs    12/01/22 0330  FERRITIN 1,129*  TIBC 224*  IRON 192*    Imaging: DG Chest Port 1 View  Result Date: 12/01/2022 CLINICAL DATA:  Status post CPR. EXAM: PORTABLE CHEST 1 VIEW COMPARISON:  11/30/2022 FINDINGS: The cardio pericardial silhouette is enlarged. Diffuse bilateral airspace disease suggests edema. No evidence for pneumothorax. Telemetry leads into fibrillator pads overlie the chest. IMPRESSION: Diffuse bilateral airspace disease suggests edema. Electronically Signed   By: Misty Stanley M.D.   On: 12/01/2022  08:05   ECHOCARDIOGRAM COMPLETE  Result Date: 11/30/2022    ECHOCARDIOGRAM REPORT   Patient Name:   [name] Date of Exam: 11/30/2022 Medical Rec #:  [id number]      Height:       64.0 in Accession #:    [id number]     Weight:       105.8 lb Date of Birth:  1981/09/09     BSA:          1.493 m Patient Age:    41 years       BP:           123/66 mmHg Patient Gender: F              HR:           100 bpm. Exam Location:  Inpatient Procedure: 2D Echo Indications:    CHF  History:        Patient has prior history of Echocardiogram examinations, most  recent 11/03/2020. CHF; Aortic Valve Disease and Mitral Valve                 Disease.  Sonographer:    Harvie Junior Referring Phys: [id number] [place]  1. Apical window is foreshortened. . Left ventricular ejection fraction, by estimation, is 35 to 40%. The left ventricle has moderately decreased function. The left ventricular internal cavity size was moderately dilated. There is mild left ventricular hypertrophy. Left ventricular diastolic parameters are indeterminate.  2. Right ventricular systolic function is moderately reduced. The right ventricular size is normal. There is moderately elevated pulmonary artery systolic pressure.  3. Left atrial size was severely dilated.  4. Right atrial size was mildly dilated.  5. Moderate mitral stenosis with thickened / restricted valve and subvalvlular apparatus Mean gradient through the valve is 6 mm Hg (HR 100 bpm), relatively unchanged from previous echo. MR is severe. The mitral valve is rheumatic. Severe mitral valve regurgitation. Moderate mitral stenosis. Severe mitral annular calcification.  6. Tricuspid valve regurgitation is moderate to severe.  7. AV is thickened, calcified with restricted motion. Peak and mean gradients hrough the valve are 22 and 12 mm Hg respectively. AVA(VTI)1.72 cm2. DVI is 0. 53 consistent with mild aortic stenosis. .Aortic insuffiiency is very eccentric,  severe... The aortic valve is tricuspid. Aortic valve regurgitation is severe.  8. The inferior vena cava is dilated in size with <50% respiratory variability, suggesting right atrial pressure of 15 mmHg. FINDINGS  Left Ventricle: Apical window is foreshortened. Left ventricular ejection fraction, by estimation, is 35 to 40%. The left ventricle has moderately decreased function. The left ventricular internal cavity size was moderately dilated. There is mild left ventricular hypertrophy. Left ventricular diastolic parameters are indeterminate. Right Ventricle: The right ventricular size is normal. Right vetricular wall thickness was not assessed. Right ventricular systolic function is moderately reduced. There is moderately elevated pulmonary artery systolic pressure. The tricuspid regurgitant  velocity is 3.07 m/s, and with an assumed right atrial pressure of 8 mmHg, the estimated right ventricular systolic pressure is 31.5 mmHg. Left Atrium: Left atrial size was severely dilated. Right Atrium: Right atrial size was mildly dilated. Pericardium: There is no evidence of pericardial effusion. Mitral Valve: Moderate mitral stenosis with thickened / restricted valve and subvalvlular apparatus Mean gradient through the valve is 6 mm Hg (HR 100 bpm), relatively unchanged from previous echo. MR is severe. The mitral valve is rheumatic. There is moderate calcification of the mitral valve leaflet(s). Severe mitral annular calcification. Severe mitral valve regurgitation. Moderate mitral valve stenosis. MV peak gradient, 16.0 mmHg. The mean mitral valve gradient is 6.0 mmHg. Tricuspid Valve: The tricuspid valve is normal in structure. Tricuspid valve regurgitation is moderate to severe. Aortic Valve: AV is thickened, calcified with restricted motion. Peak and mean gradients hrough the valve are 22 and 12 mm Hg respectively. AVA(VTI)1.72 cm2. DVI is 0. 53 consistent with mild aortic stenosis. .Aortic insuffiiency is very  eccentric, severe. The aortic valve is tricuspid. Aortic valve regurgitation is severe. Aortic regurgitation PHT measures 271 msec. Aortic valve mean gradient measures 12.0 mmHg. Aortic valve peak gradient measures 22.6 mmHg. Aortic valve area, by VTI measures 1.78  cm. Pulmonic Valve: The pulmonic valve was grossly normal. Pulmonic valve regurgitation is mild. Aorta: The aortic root is normal in size and structure. Venous: The inferior vena cava is dilated in size with less than 50% respiratory variability, suggesting right atrial pressure of 15 mmHg. IAS/Shunts: No atrial level shunt detected by  color flow Doppler.  LEFT VENTRICLE PLAX 2D LVIDd:         5.70 cm      Diastology LVIDs:         4.60 cm      LV e' medial:    8.38 cm/s LV PW:         1.20 cm      LV E/e' medial:  21.8 LV IVS:        1.20 cm      LV e' lateral:   14.10 cm/s LVOT diam:     2.10 cm      LV E/e' lateral: 13.0 LV SV:         64 LV SV Index:   43 LVOT Area:     3.46 cm  LV Volumes (MOD) LV vol d, MOD A2C: 215.0 ml LV vol d, MOD A4C: 168.0 ml LV vol s, MOD A2C: 137.0 ml LV vol s, MOD A4C: 101.0 ml LV SV MOD A2C:     78.0 ml LV SV MOD A4C:     168.0 ml LV SV MOD BP:      73.6 ml RIGHT VENTRICLE RV Basal diam:  3.80 cm RV Mid diam:    2.90 cm RV S prime:     9.14 cm/s TAPSE (M-mode): 1.5 cm LEFT ATRIUM              Index         RIGHT ATRIUM           Index LA diam:        4.50 cm  3.01 cm/m    RA Area:     17.60 cm LA Vol (A2C):   166.0 ml 111.21 ml/m  RA Volume:   45.50 ml  30.48 ml/m LA Vol (A4C):   111.0 ml 74.36 ml/m LA Biplane Vol: 143.0 ml 95.80 ml/m  AORTIC VALVE                     PULMONIC VALVE AV Area (Vmax):    1.68 cm      PV Vmax:          1.45 m/s AV Area (Vmean):   1.77 cm      PV Peak grad:     8.4 mmHg AV Area (VTI):     1.78 cm      PR End Diast Vel: 21.90 msec AV Vmax:           237.50 cm/s AV Vmean:          163.000 cm/s AV VTI:            0.360 m AV Peak Grad:      22.6 mmHg AV Mean Grad:      12.0 mmHg LVOT  Vmax:         115.00 cm/s LVOT Vmean:        83.100 cm/s LVOT VTI:          0.185 m LVOT/AV VTI ratio: 0.51 AI PHT:            271 msec  AORTA Ao Root diam: 3.50 cm MITRAL VALVE                  TRICUSPID VALVE MV Area (PHT): 5.97 cm       TR Peak grad:   37.7 mmHg MV Area VTI:   1.79 cm       TR Vmax:  307.00 cm/s MV Peak grad:  16.0 mmHg MV Mean grad:  6.0 mmHg       SHUNTS MV Vmax:       2.00 m/s       Systemic VTI:  0.18 m MV Vmean:      106.5 cm/s     Systemic Diam: 2.10 cm MV Decel Time: 127 msec MR Peak grad:    99.0 mmHg MR Mean grad:    61.0 mmHg MR Vmax:         497.50 cm/s MR Vmean:        364.0 cm/s MR PISA:         10.62 cm MR PISA Eff ROA: 45 mm MR PISA Radius:  1.30 cm MV E velocity: 183.00 cm/s MV A velocity: 39.30 cm/s MV E/A ratio:  4.66 Dorris Carnes MD Electronically signed by Dorris Carnes MD Signature Date/Time: 11/30/2022/7:39:36 PM    Final    DG Chest 2 View  Result Date: 11/30/2022 CLINICAL DATA:  Shortness of breath EXAM: CHEST - 2 VIEW COMPARISON:  04/12/2022 FINDINGS: Enlargement of cardiac silhouette with pulmonary vascular congestion. Atherosclerotic calcification aorta. Diffuse infiltrates bilaterally likely pulmonary edema increased versus previous exam. Minimal bibasilar atelectasis and tiny pleural effusions. BILATERAL nipple shadows. Fluid versus atelectasis at minor fissure. No pneumothorax or acute osseous findings. IMPRESSION: CHF. Aortic Atherosclerosis (ICD10-I70.0). Electronically Signed   By: Lavonia Dana M.D.   On: 11/30/2022 09:01    Cardiac Studies:  ECG: ST rate 97 first degree AV block LVH with stain    Telemetry:  NSR   Echo: 11/30/22 IMPRESSIONS     1. Apical window is foreshortened. . Left ventricular ejection fraction,  by estimation, is 35 to 40%. The left ventricle has moderately decreased  function. The left ventricular internal cavity size was moderately  dilated. There is mild left ventricular  hypertrophy. Left ventricular diastolic  parameters are indeterminate.   2. Right ventricular systolic function is moderately reduced. The right  ventricular size is normal. There is moderately elevated pulmonary artery  systolic pressure.   3. Left atrial size was severely dilated.   4. Right atrial size was mildly dilated.   5. Moderate mitral stenosis with thickened / restricted valve and  subvalvlular apparatus Mean gradient through the valve is 6 mm Hg (HR 100  bpm), relatively unchanged from previous echo. MR is severe. The mitral  valve is rheumatic. Severe mitral valve  regurgitation. Moderate mitral stenosis. Severe mitral annular  calcification.   6. Tricuspid valve regurgitation is moderate to severe.   7. AV is thickened, calcified with restricted motion. Peak and mean  gradients hrough the valve are 22 and 12 mm Hg respectively. AVA(VTI)1.72  cm2. DVI is 0. 53 consistent with mild aortic stenosis. .Aortic  insuffiiency is very eccentric, severe... The  aortic valve is tricuspid. Aortic valve regurgitation is severe.   8. The inferior vena cava is dilated in size with <50% respiratory  variability, suggesting right atrial pressure of 15 mmHg.    Medications:    Chlorhexidine Gluconate Cloth  6 each Topical Daily   docusate  100 mg Per Tube BID   heparin  5,000 Units Subcutaneous Q8H   multivitamin  1 tablet Oral Daily   nicotine  14 mg Transdermal Daily   polyethylene glycol  17 g Per Tube Daily   sevelamer carbonate  3,200 mg Oral TID WC   sodium zirconium cyclosilicate  10 g Oral Daily      dextrose  propofol (DIPRIVAN) infusion      Assessment/Plan:   Seizures:  this am witnessed rhythm ok during event Rx ativan and sedated with propofol now Needs head CT neurology consult AV/MV dx:  she has known severe MR/AR non compliant with f/u from 2 years ago active cocaine use , CRF and now active seizures not a candidate for any surgical or other intervention Control volume with dialysis CRF:  was to have  dialysis today prior infection in RUE fistula K 5.9   Jenkins Rouge 12/01/2022, 9:33 AM

## 2022-12-01 NOTE — Progress Notes (Signed)
0700: patient seen on the floor head leaning on the bed. Patient was lethargic but still able to answer yes or no. She said yes when asked when she fell and able to verbalize that she's okay. Called for help and then patient started having blank stare, stiffening of the body and jerking of arms and legs. Not responsive. Pt with pulse and BP 106/81. Charge nurse came in, Code blue was initiated.

## 2022-12-01 NOTE — Progress Notes (Signed)
[name] Progress Note   Subjective:   seizure this AM - resulted in intubation.  Now in ICU on EEG and propofol.  Going for head CT.  K 5.9 this AM.   Objective Vitals:   12/01/22 0735 12/01/22 0740 12/01/22 0800 12/01/22 0904  BP: (!) 139/119 (!) 167/46 (!) 136/53   Pulse: 84 81 78   Resp: (!) 25 (!) 26 (!) 28   Temp:      TempSrc:      SpO2: 100% 100% 98% 100%  Weight:      Height:       Physical Exam General: intubated and sedated Heart:RRR no rub Lungs: coarse BL, no rales appreciated Abdomen: soft, thin Extremities: no edema Dialysis Access:  RUE AVF +t/b, megafistula  Additional Objective Labs: Basic Metabolic Panel: Recent Labs  Lab 11/30/22 0806 11/30/22 1505 12/01/22 0330 12/01/22 0747  NA 137  --  139 137  K 5.1  --  5.7* 5.9*  CL 96*  --  95* 96*  CO2 17*  --  24 18*  GLUCOSE 76  --  59* 230*  BUN 66*  --  83* 87*  CREATININE 9.66*  --  11.50* 11.51*  CALCIUM 9.8  --  9.8 10.5*  PHOS  --  9.5* 10.7*  --    Liver Function Tests: Recent Labs  Lab 12/01/22 0330  ALBUMIN 2.5*   No results for input(s): "LIPASE", "AMYLASE" in the last 168 hours. CBC: Recent Labs  Lab 11/30/22 0806 12/01/22 0330  WBC 5.2 5.1  HGB 11.2* 10.9*  HCT 35.1* 34.3*  MCV 94.6 92.2  PLT 236 233   Blood Culture    Component Value Date/Time   SDES SYNOVIAL FLUID 04/14/2022 1440   SPECREQUEST LEFT SHOULDER 04/14/2022 1440   CULT  04/14/2022 1440    No growth aerobically or anaerobically. Performed at [facility], [place] [address], [place], [place] [address]    REPTSTATUS 04/19/2022 FINAL 04/14/2022 1440    Cardiac Enzymes: No results for input(s): "CKTOTAL", "CKMB", "CKMBINDEX", "TROPONINI" in the last 168 hours. CBG: Recent Labs  Lab 12/01/22 0714 12/01/22 0719 12/01/22 0724 12/01/22 0816  GLUCAP <10* <10* 226* 201*   Iron Studies:  Recent Labs    12/01/22 0330  IRON 192*  TIBC 224*  FERRITIN 1,129*    @lablastinr3 @ Studies/Results: DG Chest Port 1 View  Result Date: 12/01/2022 CLINICAL DATA:  Status post CPR. EXAM: PORTABLE CHEST 1 VIEW COMPARISON:  11/30/2022 FINDINGS: The cardio pericardial silhouette is enlarged. Diffuse bilateral airspace disease suggests edema. No evidence for pneumothorax. Telemetry leads into fibrillator pads overlie the chest. IMPRESSION: Diffuse bilateral airspace disease suggests edema. Electronically Signed   By: Misty Stanley M.D.   On: 12/01/2022 08:05   ECHOCARDIOGRAM COMPLETE  Result Date: 11/30/2022    ECHOCARDIOGRAM REPORT   Patient Name:   [name] Date of Exam: 11/30/2022 Medical Rec #:  [id number]      Height:       64.0 in Accession #:    [id number]     Weight:       105.8 lb Date of Birth:  26-Apr-1981     BSA:          1.493 m Patient Age:    41 years       BP:           123/66 mmHg Patient Gender: F  HR:           100 bpm. Exam Location:  Inpatient Procedure: 2D Echo Indications:    CHF  History:        Patient has prior history of Echocardiogram examinations, most                 recent 11/03/2020. CHF; Aortic Valve Disease and Mitral Valve                 Disease.  Sonographer:    Harvie Junior Referring Phys: [id number] [place]  1. Apical window is foreshortened. . Left ventricular ejection fraction, by estimation, is 35 to 40%. The left ventricle has moderately decreased function. The left ventricular internal cavity size was moderately dilated. There is mild left ventricular hypertrophy. Left ventricular diastolic parameters are indeterminate.  2. Right ventricular systolic function is moderately reduced. The right ventricular size is normal. There is moderately elevated pulmonary artery systolic pressure.  3. Left atrial size was severely dilated.  4. Right atrial size was mildly dilated.  5. Moderate mitral stenosis with thickened / restricted valve and subvalvlular apparatus Mean gradient through the valve is 6 mm Hg (HR 100  bpm), relatively unchanged from previous echo. MR is severe. The mitral valve is rheumatic. Severe mitral valve regurgitation. Moderate mitral stenosis. Severe mitral annular calcification.  6. Tricuspid valve regurgitation is moderate to severe.  7. AV is thickened, calcified with restricted motion. Peak and mean gradients hrough the valve are 22 and 12 mm Hg respectively. AVA(VTI)1.72 cm2. DVI is 0. 53 consistent with mild aortic stenosis. .Aortic insuffiiency is very eccentric, severe... The aortic valve is tricuspid. Aortic valve regurgitation is severe.  8. The inferior vena cava is dilated in size with <50% respiratory variability, suggesting right atrial pressure of 15 mmHg. FINDINGS  Left Ventricle: Apical window is foreshortened. Left ventricular ejection fraction, by estimation, is 35 to 40%. The left ventricle has moderately decreased function. The left ventricular internal cavity size was moderately dilated. There is mild left ventricular hypertrophy. Left ventricular diastolic parameters are indeterminate. Right Ventricle: The right ventricular size is normal. Right vetricular wall thickness was not assessed. Right ventricular systolic function is moderately reduced. There is moderately elevated pulmonary artery systolic pressure. The tricuspid regurgitant  velocity is 3.07 m/s, and with an assumed right atrial pressure of 8 mmHg, the estimated right ventricular systolic pressure is 29.7 mmHg. Left Atrium: Left atrial size was severely dilated. Right Atrium: Right atrial size was mildly dilated. Pericardium: There is no evidence of pericardial effusion. Mitral Valve: Moderate mitral stenosis with thickened / restricted valve and subvalvlular apparatus Mean gradient through the valve is 6 mm Hg (HR 100 bpm), relatively unchanged from previous echo. MR is severe. The mitral valve is rheumatic. There is moderate calcification of the mitral valve leaflet(s). Severe mitral annular calcification. Severe  mitral valve regurgitation. Moderate mitral valve stenosis. MV peak gradient, 16.0 mmHg. The mean mitral valve gradient is 6.0 mmHg. Tricuspid Valve: The tricuspid valve is normal in structure. Tricuspid valve regurgitation is moderate to severe. Aortic Valve: AV is thickened, calcified with restricted motion. Peak and mean gradients hrough the valve are 22 and 12 mm Hg respectively. AVA(VTI)1.72 cm2. DVI is 0. 53 consistent with mild aortic stenosis. .Aortic insuffiiency is very eccentric, severe. The aortic valve is tricuspid. Aortic valve regurgitation is severe. Aortic regurgitation PHT measures 271 msec. Aortic valve mean gradient measures 12.0 mmHg. Aortic valve peak gradient measures 22.6 mmHg. Aortic valve area,  by VTI measures 1.78  cm. Pulmonic Valve: The pulmonic valve was grossly normal. Pulmonic valve regurgitation is mild. Aorta: The aortic root is normal in size and structure. Venous: The inferior vena cava is dilated in size with less than 50% respiratory variability, suggesting right atrial pressure of 15 mmHg. IAS/Shunts: No atrial level shunt detected by color flow Doppler.  LEFT VENTRICLE PLAX 2D LVIDd:         5.70 cm      Diastology LVIDs:         4.60 cm      LV e' medial:    8.38 cm/s LV PW:         1.20 cm      LV E/e' medial:  21.8 LV IVS:        1.20 cm      LV e' lateral:   14.10 cm/s LVOT diam:     2.10 cm      LV E/e' lateral: 13.0 LV SV:         64 LV SV Index:   43 LVOT Area:     3.46 cm  LV Volumes (MOD) LV vol d, MOD A2C: 215.0 ml LV vol d, MOD A4C: 168.0 ml LV vol s, MOD A2C: 137.0 ml LV vol s, MOD A4C: 101.0 ml LV SV MOD A2C:     78.0 ml LV SV MOD A4C:     168.0 ml LV SV MOD BP:      73.6 ml RIGHT VENTRICLE RV Basal diam:  3.80 cm RV Mid diam:    2.90 cm RV S prime:     9.14 cm/s TAPSE (M-mode): 1.5 cm LEFT ATRIUM              Index         RIGHT ATRIUM           Index LA diam:        4.50 cm  3.01 cm/m    RA Area:     17.60 cm LA Vol (A2C):   166.0 ml 111.21 ml/m  RA Volume:    45.50 ml  30.48 ml/m LA Vol (A4C):   111.0 ml 74.36 ml/m LA Biplane Vol: 143.0 ml 95.80 ml/m  AORTIC VALVE                     PULMONIC VALVE AV Area (Vmax):    1.68 cm      PV Vmax:          1.45 m/s AV Area (Vmean):   1.77 cm      PV Peak grad:     8.4 mmHg AV Area (VTI):     1.78 cm      PR End Diast Vel: 21.90 msec AV Vmax:           237.50 cm/s AV Vmean:          163.000 cm/s AV VTI:            0.360 m AV Peak Grad:      22.6 mmHg AV Mean Grad:      12.0 mmHg LVOT Vmax:         115.00 cm/s LVOT Vmean:        83.100 cm/s LVOT VTI:          0.185 m LVOT/AV VTI ratio: 0.51 AI PHT:            271 msec  AORTA Ao Root diam: 3.50 cm  MITRAL VALVE                  TRICUSPID VALVE MV Area (PHT): 5.97 cm       TR Peak grad:   37.7 mmHg MV Area VTI:   1.79 cm       TR Vmax:        307.00 cm/s MV Peak grad:  16.0 mmHg MV Mean grad:  6.0 mmHg       SHUNTS MV Vmax:       2.00 m/s       Systemic VTI:  0.18 m MV Vmean:      106.5 cm/s     Systemic Diam: 2.10 cm MV Decel Time: 127 msec MR Peak grad:    99.0 mmHg MR Mean grad:    61.0 mmHg MR Vmax:         497.50 cm/s MR Vmean:        364.0 cm/s MR PISA:         10.62 cm MR PISA Eff ROA: 45 mm MR PISA Radius:  1.30 cm MV E velocity: 183.00 cm/s MV A velocity: 39.30 cm/s MV E/A ratio:  4.66 Dorris Carnes MD Electronically signed by Dorris Carnes MD Signature Date/Time: 11/30/2022/7:39:36 PM    Final    DG Chest 2 View  Result Date: 11/30/2022 CLINICAL DATA:  Shortness of breath EXAM: CHEST - 2 VIEW COMPARISON:  04/12/2022 FINDINGS: Enlargement of cardiac silhouette with pulmonary vascular congestion. Atherosclerotic calcification aorta. Diffuse infiltrates bilaterally likely pulmonary edema increased versus previous exam. Minimal bibasilar atelectasis and tiny pleural effusions. BILATERAL nipple shadows. Fluid versus atelectasis at minor fissure. No pneumothorax or acute osseous findings. IMPRESSION: CHF. Aortic Atherosclerosis (ICD10-I70.0). Electronically Signed   By:  Lavonia Dana M.D.   On: 11/30/2022 09:01   Medications:  dextrose     propofol (DIPRIVAN) infusion      Chlorhexidine Gluconate Cloth  6 each Topical Daily   docusate  100 mg Per Tube BID   heparin  5,000 Units Subcutaneous Q8H   multivitamin  1 tablet Oral Daily   nicotine  14 mg Transdermal Daily   polyethylene glycol  17 g Per Tube Daily   sevelamer carbonate  3,200 mg Oral TID WC   sodium zirconium cyclosilicate  10 g Oral Daily    Dialysis Orders:  TTS at [facility] 3:45hr, 400/A1.5, EDW 44kg, UFP #4, AVF, no heparin - Hectoral 28mcg IV q HD (just lowered from 40mcg). - Insurance will not approve Sensipar - no ESA - Last Phos 10.8 - supposed to be taking Renvela 4/meals   Assessment/Plan:  Acute respiratory failure/HF exacerbation: CXR with pulm edema, likely lost weight and needs lower EDW. Will maximize UF with HD today.  BP on low side with sedation which may be an issue.   Elevated troponin: Cardiology consulted, TTE ordered.  Not thought to be ischemic event.    ESRD:  Usual TTS schedule - HD today with goal to maximize UF - as above BP low with sedation.  Can use albumin support for BP today.   Hypertension/volume: BP controlled, pulm edema on CXR.  Anemia: Hgb 11.2 - no ESA yet.  Metabolic bone disease: Ca ok initially now 10.5 post seizure, Phos 10.7. Restart home binders - suspect nonadherent. SLE Seizure - per ICU Hyperkalemia - K 5.9, give lokelma now, HD later   Jannifer Hick MD 12/01/2022, 9:32 AM  [facility] Pager: [phone]

## 2022-12-01 NOTE — Consult Note (Signed)
Chief Complaint   Chief Complaint  Patient presents with   Shortness of Breath   History of Present Illness  [name] is a 41 y.o. 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  She was admitted to the general MedSurg floor and had a witnessed 8-minute seizure.  This was aborted with Ativan.  She was transferred to the intensive care unit where she was intubated for airway protection.  CT scan of the head was obtained demonstrating a large right parietal convexity subdural hematoma and neurosurgical consultation was therefore requested.  Past Medical History   Past Medical History:  Diagnosis Date   Anemia    low iron - receives iron at dialysis   Anxiety    Arthritis    RA   Chronic systolic congestive heart failure (HCC) 03/16/2016   Dyspnea    ESRD (end stage renal disease) ([place])    [place] [facility] _ [place]   H/O pericarditis 01/17/2013   H/O pleural effusion 01/17/2013   Heart murmur    Lupus (systemic lupus erythematosus) (HCC)    Previously followed with Dr. 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 pneumonae bacteremia on 01/21/2013. Sensitive to Peniccilin     Past Surgical History   Past Surgical History:  Procedure Laterality Date   AV FISTULA PLACEMENT     AV FISTULA PLACEMENT Right 08/01/2020   Procedure: RIGHT ARM  BRACHIOCEPHALIC  ARTERIOVENOUS (AV) FISTULA CREATION;  Surgeon: [name], MD;  Location: [place];  Service: Vascular;  Laterality: Right;   [place] Left 02/05/2014   Procedure: [place];  Surgeon: [name], MD;  Location: [place];  Service: Vascular;  Laterality: Left;   [place] Right 01/08/2021   Procedure: RIGHT ARM SECOND STAGE [place];  Surgeon: [name], MD;  Location: [place];  Service: Vascular;  Laterality: Right;   BIOPSY  12/17/2021   Procedure: BIOPSY;  Surgeon: Irving Copas., MD;  Location: [place];  Service: Gastroenterology;;   ESOPHAGOGASTRODUODENOSCOPY (EGD) WITH PROPOFOL N/A 12/17/2021   Procedure: ESOPHAGOGASTRODUODENOSCOPY (EGD) WITH PROPOFOL;  Surgeon: Irving Copas., MD;  Location: [place];  Service: Gastroenterology;  Laterality: N/A;   FISTULA SUPERFICIALIZATION Left 05/30/2018   Procedure: FISTULA PLICATION BASILIC VEIN TRANSPOSITION;  Surgeon: Angelia Mould, MD;  Location: [place];  Service: Vascular;  Laterality: Left;   FISTULA SUPERFICIALIZATION Left 10/24/2535   Procedure: PLICATION OF LEFT ARTERIOVENOUS FISTULA ULCER;  Surgeon: [name], MD;  Location: [place];  Service: Vascular;  Laterality: Left;   I & D EXTREMITY Right 02/18/2021   Procedure: IRRIGATION AND DEBRIDEMENT OF ARM;  Surgeon: Angelia Mould, MD;  Location: [place];  Service: Vascular;  Laterality: Right;   INSERTION OF DIALYSIS CATHETER N/A 05/19/2020   Procedure: TUNNELED INSERTION  OF DIALYSIS CATHETER;  Surgeon: Servando Snare  Harrell Gave, MD;  Location: [place];  Service: Vascular;  Laterality: N/A;   THROMBECTOMY AND REVISION OF ARTERIOVENTOUS (AV) GORETEX  GRAFT Left 07/28/2020   Procedure: Oversewing of left arm Brachial cephalic fistula for bleeding.;  Surgeon: Angelia Mould, MD;  Location: [place];  Service: Vascular;  Laterality: Left;   VENOGRAM Right 01/31/2014    Procedure: DIALYSIS CATHETER;  Surgeon: Serafina Mitchell, MD;  Location: [facility] CATH LAB;  Service: Cardiovascular;  Laterality: Right;    Social History   Social History   Tobacco Use   Smoking status: Every Day    Packs/day: 0.12    Years: 15.00    Total pack years: 1.80    Types: Cigarettes    Passive exposure: Current   Smokeless tobacco: Never   Tobacco comments:    4 cigarettes per day  Vaping Use   Vaping Use: Never used  Substance Use Topics   Alcohol use: Yes    Alcohol/week: 0.0 standard drinks of alcohol    Comment: Special Occasional takes Vicar   Drug use: Yes    Types: Marijuana    Comment: ocassional, last time- 12/21/20    Medications   Prior to Admission medications   Medication Sig Start Date End Date Taking? Authorizing Provider  acetaminophen (TYLENOL) 650 MG CR tablet Take 1,300 mg by mouth every 8 (eight) hours as needed for pain.   Yes [provider]  carvedilol (COREG) 12.5 MG tablet Take 12.5 mg by mouth daily. 11/07/19  Yes [provider]  cinacalcet (SENSIPAR) 30 MG tablet Take 30 mg by mouth every evening. 11/24/22  Yes [provider]  diphenhydrAMINE (BENADRYL) 25 MG tablet Take 1 tablet (25 mg total) by mouth every 4 (four) hours as needed for itching or allergies. Patient taking differently: Take 25 mg by mouth 2 (two) times daily. 01/23/21  Yes Lavina Hamman, MD  doxercalciferol (HECTOROL) 4 MCG/2ML injection Use as recommended by nephrology Patient taking differently: 4 mcg Every Tuesday,Thursday,and Saturday with dialysis. Use as recommended by nephrology 11/05/20  Yes Hosie Poisson, MD  loperamide (IMODIUM A-D) 2 MG tablet Take 4 mg by mouth daily as needed for diarrhea or loose stools. Dialysis days   Yes [provider]  multivitamin (RENA-VIT) TABS tablet Take 1 tablet by mouth daily.   Yes [provider]  Nutritional Supplements (NOVASOURCE RENAL) LIQD Take 237 mLs by mouth See admin  instructions. Take 1 container (237 mls) by at bedtime on dialysis days (Tuesday, Thursday, Saturday)   Yes [provider]  sevelamer carbonate (RENVELA) 800 MG tablet Take 2,400 mg by mouth 3 (three) times daily. 02/10/22  Yes [provider]  ondansetron (ZOFRAN-ODT) 4 MG disintegrating tablet Take 1 tablet (4 mg total) by mouth every 8 (eight) hours as needed for nausea or vomiting. Patient not taking: Reported on 11/30/2022 12/18/21   Concepcion Living, MD  pantoprazole (PROTONIX) 40 MG tablet Take 1 tablet (40 mg total) by mouth 2 (two) times daily before a meal. Patient not taking: Reported on 04/12/2022 12/18/21 01/17/22  Concepcion Living, MD    Allergies   Allergies  Allergen Reactions   Cephalosporins Rash    To both keflex and cefazolin   Tobramycin Sulfate Swelling    Eye swelling   Vancomycin Swelling    Review of Systems  ROS  Neurologic Exam  Eyes open spontaneously Breathing over vent Moves all extremities purposefully Intermittently follows commands BUE/BLE  Imaging  CT scan of the head  was personally reviewed.  This demonstrates a chronic right parietal convexity subdural hematoma measuring approximately 2cm.  There is associated local mass effect upon the right parietal lobe.  There is approximately 2 mm of right to left midline shift.  No hydrocephalus.  Of note, there is marked calcification of the basal ganglia bilaterally as well as the deep nuclei in the cerebellum which extends to include the superior cerebellar folia and the vermis.  Impression  - 41 y.o. female with a seizure related to chronic right parietal convexity subdural hematoma.  Prior to the seizure, the subdural appears to have been completely asymptomatic.  Patient is a very poor surgical candidate with multiple severe medical comorbidities including fluid overload.  At this point would recommend treating her seizures medically.  Once she is stabilized from a medical perspective,  could consider operative evacuation.  Alternatively, she may be a candidate for middle meningeal artery embolization.  Plan  - F/U EEG results - Start Keppra 1000mg  BID - Hold any AC and antiplatelet meds  Consuella Lose, MD [facility]

## 2022-12-01 NOTE — Progress Notes (Signed)
RT responded to Code B in [place]. Patient manually ventilated, intubation equipment prepared.  Patient became more responsive, placed on a NRB mask at 15L. Patient transported to [id number] without complications.

## 2022-12-01 NOTE — Procedures (Signed)
Intubation Procedure Note  [name]  [id number]  08/31/1981  Date:12/01/22  Time:9:02 AM   Provider Performing:Anjali Manzella Rodman Pickle, MD   Procedure: Intubation ([address])  Indication(s) Respiratory Failure  Consent Unable to obtain consent due to emergent nature of procedure.   Anesthesia Etomidate, Versed, Fentanyl, and Rocuronium   Time Out Verified patient identification, verified procedure, site/side was marked, verified correct patient position, special equipment/implants available, medications/allergies/relevant history reviewed, required imaging and test results available.   Sterile Technique Usual hand hygeine, masks, and gloves were used   Procedure Description Patient positioned in bed supine.  Sedation given as noted above.  Patient was intubated with endotracheal tube using  MAC 4 .  View was Grade 1 full glottis .  Number of attempts was 1.  Colorimetric CO2 detector was consistent with tracheal placement.   Complications/Tolerance None; patient tolerated the procedure well. Chest X-ray is ordered to verify placement.   EBL Minimal   Specimen(s) None

## 2022-12-01 NOTE — Progress Notes (Signed)
Heart Failure Navigator Progress Note  Assessed for Heart & Vascular TOC clinic readiness.  Patient does not meet criteria due to ESRD on HD.   HF Navigation team will sign-off.   Welcome Fults, MSN, RN Heart Failure Nurse Navigator   

## 2022-12-01 NOTE — Progress Notes (Incomplete)
HD#1 Subjective:   Summary: ***  Overnight Events: ***   ***  Objective:  Vital signs in last 24 hours: Vitals:   11/30/22 2133 12/01/22 0148 12/01/22 0548 12/01/22 0558  BP: 118/63 121/70 (!) 124/57   Pulse: 93 97    Resp: 19  18   Temp: 97.6 F (36.4 C)  (!) 97.5 F (36.4 C)   TempSrc: Oral  Oral   SpO2: 98% 92% 100%   Weight:    45.4 kg  Height:       Supplemental O2: {NAMES:3044014::"Room Air","Nasal Cannula","Simple Face Mask","Partial Rebreather","HFNC","Non Rebreather","Venturi Mask","Bag Valve Mask"} SpO2: 100 % O2 Flow Rate (L/min): 2 L/min   Physical Exam:  Constitutional: well-appearing *** sitting in ***, in no acute distress HENT: normocephalic atraumatic, mucous membranes moist Eyes: conjunctiva non-erythematous Neck: supple Cardiovascular: regular rate and rhythm, no m/r/g Pulmonary/Chest: normal work of breathing on room air, lungs clear to auscultation bilaterally Abdominal: soft, non-tender, non-distended MSK: normal bulk and tone Neurological: alert & oriented x 3, 5/5 strength in bilateral upper and lower extremities, normal gait Skin: warm and dry Psych: ***  Filed Weights   11/30/22 0800 12/01/22 0558  Weight: 48 kg 45.4 kg     Intake/Output Summary (Last 24 hours) at 12/01/2022 0641 Last data filed at 12/01/2022 0330 Gross per 24 hour  Intake --  Output 3 ml  Net -3 ml   Net IO Since Admission: -3 mL [12/01/22 0641]  Pertinent Labs:    Latest Ref Rng & Units 12/01/2022    3:30 AM 11/30/2022    8:06 AM 11/05/2022    9:56 AM  CBC  WBC 4.0 - 10.5 K/uL 5.1  5.2  4.6   Hemoglobin 12.0 - 15.0 g/dL 10.9  11.2  11.0   Hematocrit 36.0 - 46.0 % 34.3  35.1  33.5   Platelets 150 - 400 K/uL 233  236  259        Latest Ref Rng & Units 12/01/2022    3:30 AM 11/30/2022    8:06 AM 11/05/2022    9:56 AM  CMP  Glucose 70 - 99 mg/dL 59  76  86   BUN 6 - 20 mg/dL 83  66  25   Creatinine 0.44 - 1.00 mg/dL 11.50  9.66  6.30   Sodium 135 -  145 mmol/L 139  137  136   Potassium 3.5 - 5.1 mmol/L 5.7  5.1  3.3   Chloride 98 - 111 mmol/L 95  96  95   CO2 22 - 32 mmol/L 24  17  30    Calcium 8.9 - 10.3 mg/dL 9.8  9.8  9.9   Total Protein 6.5 - 8.1 g/dL   7.9   Total Bilirubin 0.3 - 1.2 mg/dL   0.3   Alkaline Phos 38 - 126 U/L   109   AST 15 - 41 U/L   19   ALT 0 - 44 U/L   17     Imaging: ECHOCARDIOGRAM COMPLETE  Result Date: 11/30/2022    ECHOCARDIOGRAM REPORT   Patient Name:   ODETTA FORNESS Goede Date of Exam: 11/30/2022 Medical Rec #:  [id number]      Height:       64.0 in Accession #:    [id number]     Weight:       105.8 lb Date of Birth:  [birth year]     BSA:          1.493 m Patient Age:  41 years       BP:           123/66 mmHg Patient Gender: F              HR:           100 bpm. Exam Location:  Inpatient Procedure: 2D Echo Indications:    CHF  History:        Patient has prior history of Echocardiogram examinations, most                 recent 11/03/2020. CHF; Aortic Valve Disease and Mitral Valve                 Disease.  Sonographer:    Harvie Junior Referring Phys: [id number] [place]  1. Apical window is foreshortened. . Left ventricular ejection fraction, by estimation, is 35 to 40%. The left ventricle has moderately decreased function. The left ventricular internal cavity size was moderately dilated. There is mild left ventricular hypertrophy. Left ventricular diastolic parameters are indeterminate.  2. Right ventricular systolic function is moderately reduced. The right ventricular size is normal. There is moderately elevated pulmonary artery systolic pressure.  3. Left atrial size was severely dilated.  4. Right atrial size was mildly dilated.  5. Moderate mitral stenosis with thickened / restricted valve and subvalvlular apparatus Mean gradient through the valve is 6 mm Hg (HR 100 bpm), relatively unchanged from previous echo. MR is severe. The mitral valve is rheumatic. Severe mitral valve regurgitation. Moderate  mitral stenosis. Severe mitral annular calcification.  6. Tricuspid valve regurgitation is moderate to severe.  7. AV is thickened, calcified with restricted motion. Peak and mean gradients hrough the valve are 22 and 12 mm Hg respectively. AVA(VTI)1.72 cm2. DVI is 0. 53 consistent with mild aortic stenosis. .Aortic insuffiiency is very eccentric, severe... The aortic valve is tricuspid. Aortic valve regurgitation is severe.  8. The inferior vena cava is dilated in size with <50% respiratory variability, suggesting right atrial pressure of 15 mmHg. FINDINGS  Left Ventricle: Apical window is foreshortened. Left ventricular ejection fraction, by estimation, is 35 to 40%. The left ventricle has moderately decreased function. The left ventricular internal cavity size was moderately dilated. There is mild left ventricular hypertrophy. Left ventricular diastolic parameters are indeterminate. Right Ventricle: The right ventricular size is normal. Right vetricular wall thickness was not assessed. Right ventricular systolic function is moderately reduced. There is moderately elevated pulmonary artery systolic pressure. The tricuspid regurgitant  velocity is 3.07 m/s, and with an assumed right atrial pressure of 8 mmHg, the estimated right ventricular systolic pressure is 24.4 mmHg. Left Atrium: Left atrial size was severely dilated. Right Atrium: Right atrial size was mildly dilated. Pericardium: There is no evidence of pericardial effusion. Mitral Valve: Moderate mitral stenosis with thickened / restricted valve and subvalvlular apparatus Mean gradient through the valve is 6 mm Hg (HR 100 bpm), relatively unchanged from previous echo. MR is severe. The mitral valve is rheumatic. There is moderate calcification of the mitral valve leaflet(s). Severe mitral annular calcification. Severe mitral valve regurgitation. Moderate mitral valve stenosis. MV peak gradient, 16.0 mmHg. The mean mitral valve gradient is 6.0 mmHg.  Tricuspid Valve: The tricuspid valve is normal in structure. Tricuspid valve regurgitation is moderate to severe. Aortic Valve: AV is thickened, calcified with restricted motion. Peak and mean gradients hrough the valve are 22 and 12 mm Hg respectively. AVA(VTI)1.72 cm2. DVI is 0. 53 consistent with mild aortic stenosis. .Aortic insuffiiency  is very eccentric, severe. The aortic valve is tricuspid. Aortic valve regurgitation is severe. Aortic regurgitation PHT measures 271 msec. Aortic valve mean gradient measures 12.0 mmHg. Aortic valve peak gradient measures 22.6 mmHg. Aortic valve area, by VTI measures 1.78  cm. Pulmonic Valve: The pulmonic valve was grossly normal. Pulmonic valve regurgitation is mild. Aorta: The aortic root is normal in size and structure. Venous: The inferior vena cava is dilated in size with less than 50% respiratory variability, suggesting right atrial pressure of 15 mmHg. IAS/Shunts: No atrial level shunt detected by color flow Doppler.  LEFT VENTRICLE PLAX 2D LVIDd:         5.70 cm      Diastology LVIDs:         4.60 cm      LV e' medial:    8.38 cm/s LV PW:         1.20 cm      LV E/e' medial:  21.8 LV IVS:        1.20 cm      LV e' lateral:   14.10 cm/s LVOT diam:     2.10 cm      LV E/e' lateral: 13.0 LV SV:         64 LV SV Index:   43 LVOT Area:     3.46 cm  LV Volumes (MOD) LV vol d, MOD A2C: 215.0 ml LV vol d, MOD A4C: 168.0 ml LV vol s, MOD A2C: 137.0 ml LV vol s, MOD A4C: 101.0 ml LV SV MOD A2C:     78.0 ml LV SV MOD A4C:     168.0 ml LV SV MOD BP:      73.6 ml RIGHT VENTRICLE RV Basal diam:  3.80 cm RV Mid diam:    2.90 cm RV S prime:     9.14 cm/s TAPSE (M-mode): 1.5 cm LEFT ATRIUM              Index         RIGHT ATRIUM           Index LA diam:        4.50 cm  3.01 cm/m    RA Area:     17.60 cm LA Vol (A2C):   166.0 ml 111.21 ml/m  RA Volume:   45.50 ml  30.48 ml/m LA Vol (A4C):   111.0 ml 74.36 ml/m LA Biplane Vol: 143.0 ml 95.80 ml/m  AORTIC VALVE                      PULMONIC VALVE AV Area (Vmax):    1.68 cm      PV Vmax:          1.45 m/s AV Area (Vmean):   1.77 cm      PV Peak grad:     8.4 mmHg AV Area (VTI):     1.78 cm      PR End Diast Vel: 21.90 msec AV Vmax:           237.50 cm/s AV Vmean:          163.000 cm/s AV VTI:            0.360 m AV Peak Grad:      22.6 mmHg AV Mean Grad:      12.0 mmHg LVOT Vmax:         115.00 cm/s LVOT Vmean:        83.100 cm/s LVOT VTI:  0.185 m LVOT/AV VTI ratio: 0.51 AI PHT:            271 msec  AORTA Ao Root diam: 3.50 cm MITRAL VALVE                  TRICUSPID VALVE MV Area (PHT): 5.97 cm       TR Peak grad:   37.7 mmHg MV Area VTI:   1.79 cm       TR Vmax:        307.00 cm/s MV Peak grad:  16.0 mmHg MV Mean grad:  6.0 mmHg       SHUNTS MV Vmax:       2.00 m/s       Systemic VTI:  0.18 m MV Vmean:      106.5 cm/s     Systemic Diam: 2.10 cm MV Decel Time: 127 msec MR Peak grad:    99.0 mmHg MR Mean grad:    61.0 mmHg MR Vmax:         497.50 cm/s MR Vmean:        364.0 cm/s MR PISA:         10.62 cm MR PISA Eff ROA: 45 mm MR PISA Radius:  1.30 cm MV E velocity: 183.00 cm/s MV A velocity: 39.30 cm/s MV E/A ratio:  4.66 Dorris Carnes MD Electronically signed by Dorris Carnes MD Signature Date/Time: 11/30/2022/7:39:36 PM    Final    DG Chest 2 View  Result Date: 11/30/2022 CLINICAL DATA:  Shortness of breath EXAM: CHEST - 2 VIEW COMPARISON:  04/12/2022 FINDINGS: Enlargement of cardiac silhouette with pulmonary vascular congestion. Atherosclerotic calcification aorta. Diffuse infiltrates bilaterally likely pulmonary edema increased versus previous exam. Minimal bibasilar atelectasis and tiny pleural effusions. BILATERAL nipple shadows. Fluid versus atelectasis at minor fissure. No pneumothorax or acute osseous findings. IMPRESSION: CHF. Aortic Atherosclerosis (ICD10-I70.0). Electronically Signed   By: Lavonia Dana M.D.   On: 11/30/2022 09:01    Assessment/Plan:   Principal Problem:   Acute on chronic HFrEF (heart failure with  reduced ejection fraction) (HCC) Active Problems:   Nonrheumatic aortic valve insufficiency   Nonrheumatic mitral valve regurgitation   Non-ischemic cardiomyopathy ([place])   Patient Summary: [name] is a 40 y.o. with a pertinent PMH of ***, who presented with *** and admitted for ***.    *** ***  *** ***  *** ***  *** ***  Diet: {NAMES:3044014::"Normal","Heart Healthy","Carb-Modified","Renal","Carb/Renal","NPO","TPN","Tube Feeds"} IVF: {NAMES:3044014::"None","NS","1/2 NS","LR","D5","D10"},{NAMES:3044014::"None","10cc/hr","25cc/hr","50cc/hr","75cc/hr","100cc/hr","110cc/hr","125cc/hr","Bolus"} VTE: {NAMES:3044014::"Heparin","Enoxaparin","SCDs","DOAC","None"} Code: {NAMES:3044014::"Full","DNR","DNI","DNR/DNI","Comfort Care","Unknown"} PT/OT recs: {NAMES:3044014::"None","Pending","CIR","SNF for Subacute PT","LTAC","Home Health"}, {Assistive Devices DGU:44034}. TOC recs: *** Family Update:   Dispo: Anticipated discharge to {Discharge Destination:18313::"Home"} in {NUMBERS:20191} days pending ***.   [place] Resident PGY-1 [phone] Please contact the on call pager after 5 pm and on weekends at [phone].

## 2022-12-01 NOTE — Progress Notes (Signed)
RT assisted with transport of this pt from 4N31 to CT and back while on full ventilator support. While in CT pts SpO2 read 79% with great waveform while laying flat for scan. RT removed pt from ventilator and manually bagged ventilated pt. RT suctioned pt with much improvement to SpO2. RT then placed pt back on full support. Pts Spo2 read 100% throughout scan and during transport.

## 2022-12-01 NOTE — Progress Notes (Signed)
Chaplain responded to Code Blue.  [facility] reported no family present. Chaplain not needed at this time.  [place]

## 2022-12-01 NOTE — Progress Notes (Signed)
Per CCM, request RT to place pt on CPAP/PS with plans to extubated. Pt tolerating well at this time. RN currently at bedside.

## 2022-12-01 NOTE — Code Documentation (Signed)
  Patient Name: [name]   MRN: [id number]   Date of Birth/ Sex: 1981-06-24 , female      Admission Date: 11/30/2022  Attending Provider: Candee Furbish, MD  Primary Diagnosis: Acute on chronic HFrEF (heart failure with reduced ejection fraction) ([place])   Indication: Pt was in her usual state of health until this AM, when she was noted to be unresponsive and seizing. Code blue was subsequently called. At the time of arrival on scene, patient had a pulse and was in sinus rhythm.   Technical Description:  - CPR performance duration:  0 minute  - Was defibrillation or cardioversion used? No   - Was external pacer placed? No  - Was patient intubated pre/post CPR? No   Medications Administered: Y = Yes; Blank = No Amiodarone    Atropine    Calcium  Y  Epinephrine    Lidocaine    Magnesium    Norepinephrine    Phenylephrine    Sodium bicarbonate    Vasopressin     Lorazepam 2mg  x1 D50 Amp x2   Post CPR evaluation:  - Final Status - Was patient successfully resuscitated ? Yes - What is current rhythm? NSR - What is current hemodynamic status? HDS  Miscellaneous Information:  - Labs sent, including: CBG <10  - Primary team notified?  Yes  - Family Notified? Yes  - Additional notes/ transfer status: Patient stopped seizing after administration of lorazepam 2mg , intravenous D50 was given for low CBG, levels improved from <10 to 226, patient remains obtunded but breathing spontaneously, airway appears protected, will transfer to PCCM for close observation      Serita Butcher, MD  12/01/2022, 8:04 AM

## 2022-12-01 NOTE — Progress Notes (Addendum)
   12/01/22 0700  What Happened  Was fall witnessed? No  Was patient injured? No  Patient found on floor  Found by Staff-comment  Stated prior activity other (comment) (Unknown)  Provider Notification  Provider Name/Title Dr Ina Homes  Date Provider Notified 12/01/22  Time Provider Notified [phone]  Method of Notification Call  Notification Reason  (Code blue was called)  Provider response At bedside  Date of Provider Response 12/01/22  Time of Provider Response 0705  Adult Fall Risk Assessment  Risk Factor Category (scoring not indicated) High fall risk per protocol (document High fall risk)  Patient Fall Risk Level High fall risk  Neurological  Neuro (WDL) X  Level of Consciousness Unresponsive  Speech [place]  R Pupil Size (mm) 3  R Pupil Shape Round  L Pupil Size (mm) 2  L Pupil Shape Round

## 2022-12-02 DIAGNOSIS — S065XAA Traumatic subdural hemorrhage with loss of consciousness status unknown, initial encounter: Secondary | ICD-10-CM | POA: Diagnosis not present

## 2022-12-02 DIAGNOSIS — I5023 Acute on chronic systolic (congestive) heart failure: Secondary | ICD-10-CM | POA: Diagnosis not present

## 2022-12-02 DIAGNOSIS — R569 Unspecified convulsions: Secondary | ICD-10-CM | POA: Diagnosis not present

## 2022-12-02 LAB — GLUCOSE, CAPILLARY
Glucose-Capillary: 103 mg/dL — ABNORMAL HIGH (ref 70–99)
Glucose-Capillary: 105 mg/dL — ABNORMAL HIGH (ref 70–99)
Glucose-Capillary: 106 mg/dL — ABNORMAL HIGH (ref 70–99)
Glucose-Capillary: 108 mg/dL — ABNORMAL HIGH (ref 70–99)
Glucose-Capillary: 116 mg/dL — ABNORMAL HIGH (ref 70–99)
Glucose-Capillary: 118 mg/dL — ABNORMAL HIGH (ref 70–99)
Glucose-Capillary: 128 mg/dL — ABNORMAL HIGH (ref 70–99)
Glucose-Capillary: 130 mg/dL — ABNORMAL HIGH (ref 70–99)
Glucose-Capillary: 142 mg/dL — ABNORMAL HIGH (ref 70–99)
Glucose-Capillary: 145 mg/dL — ABNORMAL HIGH (ref 70–99)
Glucose-Capillary: 157 mg/dL — ABNORMAL HIGH (ref 70–99)
Glucose-Capillary: 167 mg/dL — ABNORMAL HIGH (ref 70–99)
Glucose-Capillary: 82 mg/dL (ref 70–99)
Glucose-Capillary: 85 mg/dL (ref 70–99)
Glucose-Capillary: 89 mg/dL (ref 70–99)
Glucose-Capillary: 91 mg/dL (ref 70–99)
Glucose-Capillary: 91 mg/dL (ref 70–99)
Glucose-Capillary: 94 mg/dL (ref 70–99)
Glucose-Capillary: 96 mg/dL (ref 70–99)
Glucose-Capillary: 96 mg/dL (ref 70–99)
Glucose-Capillary: 98 mg/dL (ref 70–99)

## 2022-12-02 LAB — RENAL FUNCTION PANEL
Albumin: 2.2 g/dL — ABNORMAL LOW (ref 3.5–5.0)
Anion gap: 14 (ref 5–15)
BUN: 54 mg/dL — ABNORMAL HIGH (ref 6–20)
CO2: 26 mmol/L (ref 22–32)
Calcium: 9.1 mg/dL (ref 8.9–10.3)
Chloride: 93 mmol/L — ABNORMAL LOW (ref 98–111)
Creatinine, Ser: 7.64 mg/dL — ABNORMAL HIGH (ref 0.44–1.00)
GFR, Estimated: 6 mL/min — ABNORMAL LOW (ref >60)
Glucose, Bld: 81 mg/dL (ref 70–99)
Phosphorus: 7 mg/dL — ABNORMAL HIGH (ref 2.5–4.6)
Potassium: 3.7 mmol/L (ref 3.5–5.1)
Sodium: 133 mmol/L — ABNORMAL LOW (ref 135–145)

## 2022-12-02 LAB — CBC
HCT: 35.4 % — ABNORMAL LOW (ref 36.0–46.0)
Hemoglobin: 11.6 g/dL — ABNORMAL LOW (ref 12.0–15.0)
MCH: 29.6 pg (ref 26.0–34.0)
MCHC: 32.8 g/dL (ref 30.0–36.0)
MCV: 90.3 fL (ref 80.0–100.0)
Platelets: 247 10*3/uL (ref 150–400)
RBC: 3.92 MIL/uL (ref 3.87–5.11)
RDW: 16.3 % — ABNORMAL HIGH (ref 11.5–15.5)
WBC: 6.8 10*3/uL (ref 4.0–10.5)
nRBC: 0 % (ref 0.0–0.2)

## 2022-12-02 LAB — TRIGLYCERIDES: Triglycerides: 77 mg/dL (ref <150)

## 2022-12-02 LAB — HEPATITIS B SURFACE ANTIBODY, QUANTITATIVE: Hep B S AB Quant (Post): 99.6 m[IU]/mL (ref >9.9)

## 2022-12-02 MED ORDER — ONDANSETRON HCL 4 MG/2ML IJ SOLN
INTRAMUSCULAR | Status: AC
  Filled 2022-12-02: qty 2

## 2022-12-02 MED ORDER — ONDANSETRON HCL 4 MG/2ML IJ SOLN
4.0000 mg | Freq: Four times a day (QID) | INTRAMUSCULAR | Status: DC | PRN
  Administered 2022-12-02 – 2022-12-06 (×2): 4 mg via INTRAVENOUS
  Filled 2022-12-02: qty 2

## 2022-12-02 MED ORDER — INFLUENZA VAC SPLIT QUAD 0.5 ML IM SUSY
0.5000 mL | PREFILLED_SYRINGE | INTRAMUSCULAR | Status: DC
  Filled 2022-12-02: qty 0.5

## 2022-12-02 MED ORDER — OXYCODONE HCL 5 MG PO TABS
5.0000 mg | ORAL_TABLET | Freq: Three times a day (TID) | ORAL | Status: DC | PRN
  Administered 2022-12-02 – 2022-12-04 (×5): 5 mg via ORAL
  Filled 2022-12-02 (×5): qty 1

## 2022-12-02 MED ORDER — RENA-VITE PO TABS
1.0000 | ORAL_TABLET | Freq: Every day | ORAL | Status: DC
  Administered 2022-12-03 – 2022-12-05 (×3): 1 via ORAL
  Filled 2022-12-02 (×3): qty 1

## 2022-12-02 MED ORDER — POLYETHYLENE GLYCOL 3350 17 G PO PACK
17.0000 g | PACK | Freq: Every day | ORAL | Status: DC
  Administered 2022-12-04: 17 g via ORAL
  Filled 2022-12-02 (×2): qty 1

## 2022-12-02 NOTE — Progress Notes (Addendum)
Rounding Note    Patient Name: [name] Date of Encounter: 12/02/2022  [place] Cardiologist: Buford Dresser, MD   Subjective   Patient sitting up in bed, in no acute distress. She is somewhat unclear on yesterday's events. Says that she feels like her breathing is improved today compared with her initial presentation to the ED.   Inpatient Medications    Scheduled Meds:  Chlorhexidine Gluconate Cloth  6 each Topical Daily   docusate sodium  100 mg Oral BID   feeding supplement (NEPRO CARB STEADY)  237 mL Oral TID WC   multivitamin with minerals  1 tablet Oral Daily   nicotine  14 mg Transdermal Daily   polyethylene glycol  17 g Oral Daily   sevelamer carbonate  3,200 mg Oral TID WC   Continuous Infusions:  dextrose 5 % and 0.9% NaCl 50 mL/hr at 12/01/22 2300   levETIRAcetam 500 mg (12/02/22 0516)   PRN Meds: acetaminophen **OR** acetaminophen, albuterol, camphor-menthol, lidocaine (PF), lidocaine-prilocaine, loperamide, melatonin, mouth rinse, pentafluoroprop-tetrafluoroeth   Vital Signs    Vitals:   12/02/22 0400 12/02/22 0500 12/02/22 0600 12/02/22 0800  BP: (!) 112/55 (!) 102/59 (!) 109/58   Pulse: 97  92   Resp: (!) 24 20 (!) 27   Temp: 99 F (37.2 C)   98.7 F (37.1 C)  TempSrc: Oral   Oral  SpO2: 97%  97%   Weight:  41.9 kg    Height:        Intake/Output Summary (Last 24 hours) at 12/02/2022 0917 Last data filed at 12/01/2022 2300 Gross per 24 hour  Intake 62.61 ml  Output 3000 ml  Net -2937.39 ml      12/02/2022    5:00 AM 12/01/2022    4:28 PM 12/01/2022   11:30 AM  Last 3 Weights  Weight (lbs) 92 lb 6 oz 93 lb 7.6 oz 100 lb 1.4 oz  Weight (kg) 41.9 kg 42.4 kg 45.4 kg      Telemetry    Sinus rhythm - Personally Reviewed  ECG   No new tracing today.  Physical Exam   GEN: No acute distress.   Neck: No JVD Cardiac: RRR, loud systolic murmur  Respiratory: Clear to auscultation bilaterally. GI: Soft, nontender,  non-distended  MS: No edema; No deformity. Neuro:  Nonfocal  Psych: Normal affect   Labs    High Sensitivity Troponin:   Recent Labs  Lab 11/30/22 0806 11/30/22 1138 11/30/22 1505 11/30/22 1932  TROPONINIHS 400* 428* 411* 393*     Chemistry Recent Labs  Lab 11/30/22 1147 12/01/22 0330 12/01/22 0747 12/01/22 1025 12/02/22 0606  NA  --  139 137 135 133*  K  --  5.7* 5.9* 5.1 3.7  CL  --  95* 96*  --  93*  CO2  --  24 18*  --  26  GLUCOSE  --  59* 230*  --  81  BUN  --  83* 87*  --  54*  CREATININE  --  11.50* 11.51*  --  7.64*  CALCIUM  --  9.8 10.5*  --  9.1  MG 2.7*  --   --   --   --   ALBUMIN  --  2.5*  --   --  2.2*  GFRNONAA  --  4* 4*  --  6*  ANIONGAP  --  20* 23*  --  14    Lipids  Recent Labs  Lab 12/02/22 0606  TRIG  77    Hematology Recent Labs  Lab 11/30/22 0806 12/01/22 0330 12/01/22 1025 12/02/22 0606  WBC 5.2 5.1  --  6.8  RBC 3.71* 3.72*  --  3.92  HGB 11.2* 10.9* 11.6* 11.6*  HCT 35.1* 34.3* 34.0* 35.4*  MCV 94.6 92.2  --  90.3  MCH 30.2 29.3  --  29.6  MCHC 31.9 31.8  --  32.8  RDW 16.9* 16.4*  --  16.3*  PLT 236 233  --  247   Thyroid No results for input(s): "TSH", "FREET4" in the last 168 hours.  BNP Recent Labs  Lab 11/30/22 1100  BNP >4,500.0*    DDimer No results for input(s): "DDIMER" in the last 168 hours.   Radiology    Overnight EEG with video  Result Date: 12/02/2022 Samuella Cota, MD     12/02/2022  8:34 AM EEG Procedure CPT/Type of Study: 61443; 24hr EEG with video Referring Provider: Opal Sidles Primary Neurological Diagnosis: AMS  History: This is a 41yr old patient, undergoing an EEG to evaluate for encephalopathy. Clinical State: disoriented  Technical Description:  The EEG was performed using standard setting per the guidelines of American Clinical Neurophysiology Society (ACNS).  A minimum of 21 electrodes were placed on scalp according to the International 10-20 or/and 10-10 Systems. Supplemental electrodes were  placed as needed. Single EKG electrode was also used to detect cardiac arrhythmia. Patient's behavior was continuously recorded on video simultaneously with EEG. A minimum of 16 channels were used for data display. Each epoch of study was reviewed manually daily and as needed using standard referential and bipolar montages. Computerized quantitative EEG analysis (such as compressed spectral array analysis, trending, automated spike & seizure detection) were used as indicated.  Day 1: from 0820 12/01/22 to 0730 12/02/22  EEG Description: Overall Amplitude:Normal Predominant Frequency: The background activity showed posterior dominant alpha, with about 8-9 Hz, that was occasional. There was improving waking background after extubation. Superimposed Frequencies: frequent theta and some beta activity bilaterally The background was symmetric  Background Abnormalities: Generalized slowing; diffuse slowing which improved as sedation was weaned Rhythmic or periodic pattern: No Epileptiform activity: no Electrographic seizures: no Events: no  Breach rhythm: no  Reactivity: Present  Stimulation procedures: Hyperventilation: not done Photic stimulation: no change  Sleep Background: Stage II  EKG:no significant arrhythmia  Impression: This was an abnormal continuous video EEG due to resolving diffuse background slowing, indicative of a resolving encephalopathy pattern. There were no epileptiform discharges or seizures.   CT HEAD WO CONTRAST (5MM)  Addendum Date: 12/01/2022   ADDENDUM REPORT: 12/01/2022 11:30 ADDENDUM: These results were called by telephone at the time of interpretation on 12/01/2022 at 11:09 am to provider [facility] [place] , who verbally acknowledged these results. Electronically Signed   By: Kellie Simmering D.O.   On: 12/01/2022 11:30   Result Date: 12/01/2022 CLINICAL DATA:  Provided history: Mental status change, unknown cause. EXAM: CT HEAD WITHOUT CONTRAST TECHNIQUE: Contiguous axial images were obtained from  the base of the skull through the vertex without intravenous contrast. RADIATION DOSE REDUCTION: This exam was performed according to the departmental dose-optimization program which includes automated exposure control, adjustment of the mA and/or kV according to patient size and/or use of iterative reconstruction technique. COMPARISON:  Prior head CT examinations 01/01/2018 and earlier. FINDINGS: Brain: Age advanced parenchymal atrophy. Mixed density subdural collection overlying the right cerebral hemisphere, measuring up to 19 mm in thickness. The collection is predominantly intermediate to low density. However, there are  also small volume hyperdense components. 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 Mass effect upon the underlying left cerebral hemisphere (greatest upon the left parietal lobe). 2 mm leftward midline shift. Prominent calcifications within the bilateral corona radiata, basal ganglia and cerebellar white matter, progressed from the prior head CT of 01/01/2018. Partially empty sella turcica. No demarcated cortical infarct. No evidence of an intracranial mass. Vascular: No hyperdense vessel. Skull: No fracture or aggressive osseous lesion. Sinuses/Orbits: Bilateral proptosis. Minimal mucosal thickening scattered within the paranasal sinuses. Attempts are being made to reach the ordering provider at this time. IMPRESSION: Subdural collection overlying the right cerebral hemisphere, measuring up to 19 mm in thickness. 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 Mass effect upon the underlying right cerebral hemisphere with 2 mm leftward midline shift. Prominent calcifications within the bilateral corona radiata, basal ganglia and cerebellar white matter, progressed from the prior head CT of 01/01/2018. A history of end-stage renal disease is noted in the patient's medical chart, and  these findings may be related to secondary hyperparathyroidism. Age advanced parenchymal atrophy. Bilateral proptosis. Electronically Signed: By: Kellie Simmering D.O. On: 12/01/2022 10:52   DG CHEST PORT 1 VIEW  Result Date: 12/01/2022 CLINICAL DATA:  Endotracheal tube placement and enteric tube placement. EXAM: PORTABLE CHEST 1 VIEW COMPARISON:  12/01/2022 at 7:53 a.m. FINDINGS: Endotracheal tube has tip 4.2 cm above the carina. Enteric tube courses into the region of the stomach and off the film as tip is not visualized, although side-port lies within the stomach in the left upper quadrant. Lungs are adequately inflated demonstrate hazy bilateral perihilar opacification suggesting interstitial edema and less likely infection. Persistent left basilar opacification likely atelectasis/effusion unchanged. Mediastinal silhouette and remainder of the exam is unchanged. IMPRESSION: 1. Hazy bilateral perihilar opacification suggesting interstitial edema and less likely infection. Persistent left basilar opacification likely atelectasis/effusion. 2. Tubes and lines as described. Electronically Signed   By: Marin Olp M.D.   On: 12/01/2022 09:35   DG Chest Port 1 View  Result Date: 12/01/2022 CLINICAL DATA:  Status post CPR. EXAM: PORTABLE CHEST 1 VIEW COMPARISON:  11/30/2022 FINDINGS: The cardio pericardial silhouette is enlarged. Diffuse bilateral airspace disease suggests edema. No evidence for pneumothorax. Telemetry leads into fibrillator pads overlie the chest. IMPRESSION: Diffuse bilateral airspace disease suggests edema. Electronically Signed   By: Misty Stanley M.D.   On: 12/01/2022 08:05   ECHOCARDIOGRAM COMPLETE  Result Date: 11/30/2022    ECHOCARDIOGRAM REPORT   Patient Name:   [name] Date of Exam: 11/30/2022 Medical Rec #:  [id number]      Height:       64.0 in Accession #:    [id number]     Weight:       105.8 lb Date of Birth:  1981-02-28     BSA:          1.493 m Patient Age:    74 years        BP:           123/66 mmHg Patient Gender: F              HR:           100 bpm. Exam Location:  Inpatient Procedure: 2D Echo Indications:    CHF  History:        Patient has prior history of Echocardiogram examinations, most  recent 11/03/2020. CHF; Aortic Valve Disease and Mitral Valve                 Disease.  Sonographer:    Harvie Junior Referring Phys: [id number] Anton  1. Apical window is foreshortened. . Left ventricular ejection fraction, by estimation, is 35 to 40%. The left ventricle has moderately decreased function. The left ventricular internal cavity size was moderately dilated. There is mild left ventricular hypertrophy. Left ventricular diastolic parameters are indeterminate.  2. Right ventricular systolic function is moderately reduced. The right ventricular size is normal. There is moderately elevated pulmonary artery systolic pressure.  3. Left atrial size was severely dilated.  4. Right atrial size was mildly dilated.  5. Moderate mitral stenosis with thickened / restricted valve and subvalvlular apparatus Mean gradient through the valve is 6 mm Hg (HR 100 bpm), relatively unchanged from previous echo. MR is severe. The mitral valve is rheumatic. Severe mitral valve regurgitation. Moderate mitral stenosis. Severe mitral annular calcification.  6. Tricuspid valve regurgitation is moderate to severe.  7. AV is thickened, calcified with restricted motion. Peak and mean gradients hrough the valve are 22 and 12 mm Hg respectively. AVA(VTI)1.72 cm2. DVI is 0. 53 consistent with mild aortic stenosis. .Aortic insuffiiency is very eccentric, severe... The aortic valve is tricuspid. Aortic valve regurgitation is severe.  8. The inferior vena cava is dilated in size with <50% respiratory variability, suggesting right atrial pressure of 15 mmHg. FINDINGS  Left Ventricle: Apical window is foreshortened. Left ventricular ejection fraction, by estimation, is 35 to 40%. The left  ventricle has moderately decreased function. The left ventricular internal cavity size was moderately dilated. There is mild left ventricular hypertrophy. Left ventricular diastolic parameters are indeterminate. Right Ventricle: The right ventricular size is normal. Right vetricular wall thickness was not assessed. Right ventricular systolic function is moderately reduced. There is moderately elevated pulmonary artery systolic pressure. The tricuspid regurgitant  velocity is 3.07 m/s, and with an assumed right atrial pressure of 8 mmHg, the estimated right ventricular systolic pressure is 25.3 mmHg. Left Atrium: Left atrial size was severely dilated. Right Atrium: Right atrial size was mildly dilated. Pericardium: There is no evidence of pericardial effusion. Mitral Valve: Moderate mitral stenosis with thickened / restricted valve and subvalvlular apparatus Mean gradient through the valve is 6 mm Hg (HR 100 bpm), relatively unchanged from previous echo. MR is severe. The mitral valve is rheumatic. There is moderate calcification of the mitral valve leaflet(s). Severe mitral annular calcification. Severe mitral valve regurgitation. Moderate mitral valve stenosis. MV peak gradient, 16.0 mmHg. The mean mitral valve gradient is 6.0 mmHg. Tricuspid Valve: The tricuspid valve is normal in structure. Tricuspid valve regurgitation is moderate to severe. Aortic Valve: AV is thickened, calcified with restricted motion. Peak and mean gradients hrough the valve are 22 and 12 mm Hg respectively. AVA(VTI)1.72 cm2. DVI is 0. 53 consistent with mild aortic stenosis. .Aortic insuffiiency is very eccentric, severe. The aortic valve is tricuspid. Aortic valve regurgitation is severe. Aortic regurgitation PHT measures 271 msec. Aortic valve mean gradient measures 12.0 mmHg. Aortic valve peak gradient measures 22.6 mmHg. Aortic valve area, by VTI measures 1.78  cm. Pulmonic Valve: The pulmonic valve was grossly normal. Pulmonic valve  regurgitation is mild. Aorta: The aortic root is normal in size and structure. Venous: The inferior vena cava is dilated in size with less than 50% respiratory variability, suggesting right atrial pressure of 15 mmHg. IAS/Shunts: No atrial level shunt detected by  color flow Doppler.  LEFT VENTRICLE PLAX 2D LVIDd:         5.70 cm      Diastology LVIDs:         4.60 cm      LV e' medial:    8.38 cm/s LV PW:         1.20 cm      LV E/e' medial:  21.8 LV IVS:        1.20 cm      LV e' lateral:   14.10 cm/s LVOT diam:     2.10 cm      LV E/e' lateral: 13.0 LV SV:         64 LV SV Index:   43 LVOT Area:     3.46 cm  LV Volumes (MOD) LV vol d, MOD A2C: 215.0 ml LV vol d, MOD A4C: 168.0 ml LV vol s, MOD A2C: 137.0 ml LV vol s, MOD A4C: 101.0 ml LV SV MOD A2C:     78.0 ml LV SV MOD A4C:     168.0 ml LV SV MOD BP:      73.6 ml RIGHT VENTRICLE RV Basal diam:  3.80 cm RV Mid diam:    2.90 cm RV S prime:     9.14 cm/s TAPSE (M-mode): 1.5 cm LEFT ATRIUM              Index         RIGHT ATRIUM           Index LA diam:        4.50 cm  3.01 cm/m    RA Area:     17.60 cm LA Vol (A2C):   166.0 ml 111.21 ml/m  RA Volume:   45.50 ml  30.48 ml/m LA Vol (A4C):   111.0 ml 74.36 ml/m LA Biplane Vol: 143.0 ml 95.80 ml/m  AORTIC VALVE                     PULMONIC VALVE AV Area (Vmax):    1.68 cm      PV Vmax:          1.45 m/s AV Area (Vmean):   1.77 cm      PV Peak grad:     8.4 mmHg AV Area (VTI):     1.78 cm      PR End Diast Vel: 21.90 msec AV Vmax:           237.50 cm/s AV Vmean:          163.000 cm/s AV VTI:            0.360 m AV Peak Grad:      22.6 mmHg AV Mean Grad:      12.0 mmHg LVOT Vmax:         115.00 cm/s LVOT Vmean:        83.100 cm/s LVOT VTI:          0.185 m LVOT/AV VTI ratio: 0.51 AI PHT:            271 msec  AORTA Ao Root diam: 3.50 cm MITRAL VALVE                  TRICUSPID VALVE MV Area (PHT): 5.97 cm       TR Peak grad:   37.7 mmHg MV Area VTI:   1.79 cm       TR Vmax:  307.00 cm/s MV Peak grad:  16.0  mmHg MV Mean grad:  6.0 mmHg       SHUNTS MV Vmax:       2.00 m/s       Systemic VTI:  0.18 m MV Vmean:      106.5 cm/s     Systemic Diam: 2.10 cm MV Decel Time: 127 msec MR Peak grad:    99.0 mmHg MR Mean grad:    61.0 mmHg MR Vmax:         497.50 cm/s MR Vmean:        364.0 cm/s MR PISA:         10.62 cm MR PISA Eff ROA: 45 mm MR PISA Radius:  1.30 cm MV E velocity: 183.00 cm/s MV A velocity: 39.30 cm/s MV E/A ratio:  4.66 Dorris Carnes MD Electronically signed by Dorris Carnes MD Signature Date/Time: 11/30/2022/7:39:36 PM    Final     Cardiac Studies   11/30/22 TTE  IMPRESSIONS     1. Apical window is foreshortened. . Left ventricular ejection fraction,  by estimation, is 35 to 40%. The left ventricle has moderately decreased  function. The left ventricular internal cavity size was moderately  dilated. There is mild left ventricular  hypertrophy. Left ventricular diastolic parameters are indeterminate.   2. Right ventricular systolic function is moderately reduced. The right  ventricular size is normal. There is moderately elevated pulmonary artery  systolic pressure.   3. Left atrial size was severely dilated.   4. Right atrial size was mildly dilated.   5. Moderate mitral stenosis with thickened / restricted valve and  subvalvlular apparatus Mean gradient through the valve is 6 mm Hg (HR 100  bpm), relatively unchanged from previous echo. MR is severe. The mitral  valve is rheumatic. Severe mitral valve  regurgitation. Moderate mitral stenosis. Severe mitral annular  calcification.   6. Tricuspid valve regurgitation is moderate to severe.   7. AV is thickened, calcified with restricted motion. Peak and mean  gradients hrough the valve are 22 and 12 mm Hg respectively. AVA(VTI)1.72  cm2. DVI is 0. 53 consistent with mild aortic stenosis. .Aortic  insuffiiency is very eccentric, severe... The  aortic valve is tricuspid. Aortic valve regurgitation is severe.   8. The inferior vena cava is  dilated in size with <50% respiratory  variability, suggesting right atrial pressure of 15 mmHg.   FINDINGS   Left Ventricle: Apical window is foreshortened. Left ventricular ejection  fraction, by estimation, is 35 to 40%. The left ventricle has moderately  decreased function. The left ventricular internal cavity size was  moderately dilated. There is mild left  ventricular hypertrophy. Left ventricular diastolic parameters are  indeterminate.   Right Ventricle: The right ventricular size is normal. Right vetricular  wall thickness was not assessed. Right ventricular systolic function is  moderately reduced. There is moderately elevated pulmonary artery systolic  pressure. The tricuspid regurgitant   velocity is 3.07 m/s, and with an assumed right atrial pressure of 8  mmHg, the estimated right ventricular systolic pressure is 14.9 mmHg.   Left Atrium: Left atrial size was severely dilated.   Right Atrium: Right atrial size was mildly dilated.   Pericardium: There is no evidence of pericardial effusion.   Mitral Valve: Moderate mitral stenosis with thickened / restricted valve  and subvalvlular apparatus Mean gradient through the valve is 6 mm Hg (HR  100 bpm), relatively unchanged from previous echo. MR is severe. The  mitral  valve is rheumatic. There is  moderate calcification of the mitral valve leaflet(s). Severe mitral  annular calcification. Severe mitral valve regurgitation. Moderate mitral  valve stenosis. MV peak gradient, 16.0 mmHg. The mean mitral valve  gradient is 6.0 mmHg.   Tricuspid Valve: The tricuspid valve is normal in structure. Tricuspid  valve regurgitation is moderate to severe.   Aortic Valve: AV is thickened, calcified with restricted motion. Peak and  mean gradients hrough the valve are 22 and 12 mm Hg respectively.  AVA(VTI)1.72 cm2. DVI is 0. 53 consistent with mild aortic stenosis.  .Aortic insuffiiency is very eccentric,  severe. The aortic valve  is tricuspid. Aortic valve regurgitation is  severe. Aortic regurgitation PHT measures 271 msec. Aortic valve mean  gradient measures 12.0 mmHg. Aortic valve peak gradient measures 22.6  mmHg. Aortic valve area, by VTI measures 1.78   cm.   Pulmonic Valve: The pulmonic valve was grossly normal. Pulmonic valve  regurgitation is mild.   Aorta: The aortic root is normal in size and structure.   Venous: The inferior vena cava is dilated in size with less than 50%  respiratory variability, suggesting right atrial pressure of 15 mmHg.   IAS/Shunts: No atrial level shunt detected by color flow Doppler.   Patient Profile     [name] is a 41 y.o. female with a hx of lupus, lupus nephritis, ESRD (on hemodialysis Tuesday Thursday Saturday), systolic heart failure, polysubstance use who is being seen 11/30/2022 for the evaluation of elevated troponin at the request of Dr. Ronnald Nian. On the morning of 12/5, patient was found down on the floor and had an 8 minute seizure. She was subsequently intubated for airway management. Extubated later in the day.   Assessment & Plan    Acute heart failure with reduced ejection fraction Cardiomyopathy, unspecified   Patient first noted with reduced LVEF in 2017, 20 to 25%.  TTE this admission with LVEF 35-40%.    BNP >4500 this admission. Patient on home regimen of only Coreg 12.5 mg. Reportedly only takes once daily. Held at this time given low BP.  Diuresis per nephrology team, 3L removed yesterday. Weight down from 48kg to 41.9kg.     Moderate to severe MR, severe mitral annular calcification Aortic regurgitation, severe   TTE completed this admission. AV is thickened, calcified with restricted motion. Peak and mean gradients hrough the valve are 22 and 12 mm Hg respectively. AVA(VTI)1.72 cm2. DVI is 0. 53 consistent with mild aortic stenosis. Aortic insuffiiency is very eccentric, severe. The aortic valve is tricuspid. Aortic valve regurgitation  is severe. Aortic regurgitation PHT measures 271 msec. Aortic valve mean gradient measures 12.0 mmHg. Aortic valve peak gradient measures 22.6 mmHg. Aortic valve area, by VTI measures 1.78 cm. Severe mitral annular calcification. Severe mitral valve regurgitation. Moderate mitral valve stenosis. MV peak gradient, 16.0 mmHg. The mean mitral valve gradient is 6.0 mmHg.    Suspect that patient's dyspnea and CHF exacerbation are a result of progressive valvular disease.  Not a candidate for surgical repair. Will need careful volume management with dialysis going forward.    Elevated troponin   Patient with troponin elevated to 400,428 this admission. Denies chest pain. ECG without acute/new ischemic changes (T wave inversion in V6 noted on April 2023 ECG).  Given flat troponin trend, less likely acute ischemia, and more likely due to acute heart failure, valvular disease.     ESRD   On Tuesday, Thursday, Saturday dialysis schedule. Will require careful volume  management due to progressive valve disease.   Seizure Subdural hematoma  Patient found on the floor yesterday, with 8 minute seizure. Required intubation for airway management, now extubated, on O2 by . CT head with large right parietal convexity subdural hematoma.    Neurosurgery consulting. Avoid anticoagulation, antiplatelet therapy. Operative evacuation?       For questions or updates, please contact [place] Please consult [email or web address] for contact info under        Signed, Lily Kocher, PA-C  12/02/2022, 9:17 AM    Events from yesterday noted. ? Seizure vs hypoglycemia with short period of intubation and sudural hematoma noted on CT head. Has been seen by neurosurgery. CRF volume manged by dialysis Severe AR/MR not a candidate for surgery or intervention given co morbidities and drug use. Not clear why BS so hard to keep up. On Keppra for seizures Has fistula in RUE for her dialysis Amnestic to events from  yesterday morning   Jenkins Rouge MD [facility]

## 2022-12-02 NOTE — Progress Notes (Signed)
[facility] Progress Note   Subjective:   CT yest with SDH - no plans for operative intervention.  Extubated.  Breathing reported mostly improved after UF 3L with HD yesterday - wt this AM recorded 41.9 but was bed wt.  She is hoping for d/c but agreeable to staying.   Objective Vitals:   12/02/22 0600 12/02/22 0700 12/02/22 0800 12/02/22 0900  BP: (!) 109/58 98/64 (!) 92/47 101/63  Pulse: 92 (!) 110 87 84  Resp: (!) 27 (!) 25 17 19   Temp:   98.7 F (37.1 C)   TempSrc:   Oral   SpO2: 97% 94% 100% 99%  Weight:      Height:       Physical Exam General: awake, alert, comfortable Heart:RRR no rub Lungs: coarse BL, no rales appreciated Abdomen: soft, thin Extremities: no edema Dialysis Access:  RUE AVF +t/b  Additional Objective Labs: Basic Metabolic Panel: Recent Labs  Lab 11/30/22 1505 12/01/22 0330 12/01/22 0747 12/01/22 1025 12/02/22 0606  NA  --  139 137 135 133*  K  --  5.7* 5.9* 5.1 3.7  CL  --  95* 96*  --  93*  CO2  --  24 18*  --  26  GLUCOSE  --  59* 230*  --  81  BUN  --  83* 87*  --  54*  CREATININE  --  11.50* 11.51*  --  7.64*  CALCIUM  --  9.8 10.5*  --  9.1  PHOS 9.5* 10.7*  --   --  7.0*    Liver Function Tests: Recent Labs  Lab 12/01/22 0330 12/02/22 0606  ALBUMIN 2.5* 2.2*    No results for input(s): "LIPASE", "AMYLASE" in the last 168 hours. CBC: Recent Labs  Lab 11/30/22 0806 12/01/22 0330 12/01/22 1025 12/02/22 0606  WBC 5.2 5.1  --  6.8  HGB 11.2* 10.9* 11.6* 11.6*  HCT 35.1* 34.3* 34.0* 35.4*  MCV 94.6 92.2  --  90.3  PLT 236 233  --  247    Blood Culture    Component Value Date/Time   SDES SYNOVIAL FLUID 04/14/2022 1440   SPECREQUEST LEFT SHOULDER 04/14/2022 1440   CULT  04/14/2022 1440    No growth aerobically or anaerobically. Performed at [facility], [place] [address] [place], [place] [address]    REPTSTATUS 04/19/2022 FINAL 04/14/2022 1440    Cardiac Enzymes: No results for input(s):  "CKTOTAL", "CKMB", "CKMBINDEX", "TROPONINI" in the last 168 hours. CBG: Recent Labs  Lab 12/02/22 0600 12/02/22 0716 12/02/22 0803 12/02/22 0903 12/02/22 1020  GLUCAP 85 130* 103* 116* 167*    Iron Studies:  Recent Labs    12/01/22 0330  IRON 192*  TIBC 224*  FERRITIN 1,129*    @lablastinr3 @ Studies/Results: Overnight EEG with video  Result Date: 12/02/2022 Samuella Cota, MD     12/02/2022  8:34 AM EEG Procedure CPT/Type of Study: 40814; 24hr EEG with video Referring Provider: Opal Sidles Primary Neurological Diagnosis: AMS  History: This is a 41yr old patient, undergoing an EEG to evaluate for encephalopathy. Clinical State: disoriented  Technical Description:  The EEG was performed using standard setting per the guidelines of American Clinical Neurophysiology Society (ACNS).  A minimum of 21 electrodes were placed on scalp according to the International 10-20 or/and 10-10 Systems. Supplemental electrodes were placed as needed. Single EKG electrode was also used to detect cardiac arrhythmia. Patient's behavior was continuously recorded on video simultaneously with EEG. A minimum of 16  channels were used for data display. Each epoch of study was reviewed manually daily and as needed using standard referential and bipolar montages. Computerized quantitative EEG analysis (such as compressed spectral array analysis, trending, automated spike & seizure detection) were used as indicated.  Day 1: from 0820 12/01/22 to 0730 12/02/22  EEG Description: Overall Amplitude:Normal Predominant Frequency: The background activity showed posterior dominant alpha, with about 8-9 Hz, that was occasional. There was improving waking background after extubation. Superimposed Frequencies: frequent theta and some beta activity bilaterally The background was symmetric  Background Abnormalities: Generalized slowing; diffuse slowing which improved as sedation was weaned Rhythmic or periodic pattern: No Epileptiform activity: no  Electrographic seizures: no Events: no  Breach rhythm: no  Reactivity: Present  Stimulation procedures: Hyperventilation: not done Photic stimulation: no change  Sleep Background: Stage II  EKG:no significant arrhythmia  Impression: This was an abnormal continuous video EEG due to resolving diffuse background slowing, indicative of a resolving encephalopathy pattern. There were no epileptiform discharges or seizures.   CT HEAD WO CONTRAST (5MM)  Addendum Date: 12/01/2022   ADDENDUM REPORT: 12/01/2022 11:30 ADDENDUM: These results were called by telephone at the time of interpretation on 12/01/2022 at 11:09 am to provider [facility] ELLISON , who verbally acknowledged these results. Electronically Signed   By: Kellie Simmering D.O.   On: 12/01/2022 11:30   Result Date: 12/01/2022 CLINICAL DATA:  Provided history: Mental status change, unknown cause. EXAM: CT HEAD WITHOUT CONTRAST TECHNIQUE: Contiguous axial images were obtained from the base of the skull through the vertex without intravenous contrast. RADIATION DOSE REDUCTION: This exam was performed according to the departmental dose-optimization program which includes automated exposure control, adjustment of the mA and/or kV according to patient size and/or use of iterative reconstruction technique. COMPARISON:  Prior head CT examinations 01/01/2018 and earlier. FINDINGS: Brain: Age advanced parenchymal atrophy. Mixed density subdural collection overlying the right cerebral hemisphere, measuring up to 19 mm in thickness. The collection is predominantly intermediate to low density. However, there are also small volume hyperdense components. 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 Mass effect upon the underlying left cerebral hemisphere (greatest upon the left parietal lobe). 2 mm leftward midline shift. Prominent calcifications within the bilateral corona radiata, basal ganglia and cerebellar white matter,  progressed from the prior head CT of 01/01/2018. Partially empty sella turcica. No demarcated cortical infarct. No evidence of an intracranial mass. Vascular: No hyperdense vessel. Skull: No fracture or aggressive osseous lesion. Sinuses/Orbits: Bilateral proptosis. Minimal mucosal thickening scattered within the paranasal sinuses. Attempts are being made to reach the ordering provider at this time. IMPRESSION: Subdural collection overlying the right cerebral hemisphere, measuring up to 19 mm in thickness. 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 Mass effect upon the underlying right cerebral hemisphere with 2 mm leftward midline shift. Prominent calcifications within the bilateral corona radiata, basal ganglia and cerebellar white matter, progressed from the prior head CT of 01/01/2018. A history of end-stage renal disease is noted in the patient's medical chart, and these findings may be related to secondary hyperparathyroidism. Age advanced parenchymal atrophy. Bilateral proptosis. Electronically Signed: By: Kellie Simmering D.O. On: 12/01/2022 10:52   DG CHEST PORT 1 VIEW  Result Date: 12/01/2022 CLINICAL DATA:  Endotracheal tube placement and enteric tube placement. EXAM: PORTABLE CHEST 1 VIEW COMPARISON:  12/01/2022 at 7:53 a.m. FINDINGS: Endotracheal tube has tip 4.2 cm above the carina.  Enteric tube courses into the region of the stomach and off the film as tip is not visualized, although side-port lies within the stomach in the left upper quadrant. Lungs are adequately inflated demonstrate hazy bilateral perihilar opacification suggesting interstitial edema and less likely infection. Persistent left basilar opacification likely atelectasis/effusion unchanged. Mediastinal silhouette and remainder of the exam is unchanged. IMPRESSION: 1. Hazy bilateral perihilar opacification suggesting interstitial edema and less likely infection. Persistent left  basilar opacification likely atelectasis/effusion. 2. Tubes and lines as described. Electronically Signed   By: Marin Olp M.D.   On: 12/01/2022 09:35   DG Chest Port 1 View  Result Date: 12/01/2022 CLINICAL DATA:  Status post CPR. EXAM: PORTABLE CHEST 1 VIEW COMPARISON:  11/30/2022 FINDINGS: The cardio pericardial silhouette is enlarged. Diffuse bilateral airspace disease suggests edema. No evidence for pneumothorax. Telemetry leads into fibrillator pads overlie the chest. IMPRESSION: Diffuse bilateral airspace disease suggests edema. Electronically Signed   By: Misty Stanley M.D.   On: 12/01/2022 08:05   ECHOCARDIOGRAM COMPLETE  Result Date: 11/30/2022    ECHOCARDIOGRAM REPORT   Patient Name:   JOCELIN SCHUELKE Mccain Date of Exam: 11/30/2022 Medical Rec #:  [id number]      Height:       64.0 in Accession #:    [id number]     Weight:       105.8 lb Date of Birth:  06/22/1981     BSA:          1.493 m Patient Age:    56 years       BP:           123/66 mmHg Patient Gender: F              HR:           100 bpm. Exam Location:  Inpatient Procedure: 2D Echo Indications:    CHF  History:        Patient has prior history of Echocardiogram examinations, most                 recent 11/03/2020. CHF; Aortic Valve Disease and Mitral Valve                 Disease.  Sonographer:    Harvie Junior Referring Phys: [id number] [place]  1. Apical window is foreshortened. . Left ventricular ejection fraction, by estimation, is 35 to 40%. The left ventricle has moderately decreased function. The left ventricular internal cavity size was moderately dilated. There is mild left ventricular hypertrophy. Left ventricular diastolic parameters are indeterminate.  2. Right ventricular systolic function is moderately reduced. The right ventricular size is normal. There is moderately elevated pulmonary artery systolic pressure.  3. Left atrial size was severely dilated.  4. Right atrial size was mildly dilated.  5. Moderate mitral  stenosis with thickened / restricted valve and subvalvlular apparatus Mean gradient through the valve is 6 mm Hg (HR 100 bpm), relatively unchanged from previous echo. MR is severe. The mitral valve is rheumatic. Severe mitral valve regurgitation. Moderate mitral stenosis. Severe mitral annular calcification.  6. Tricuspid valve regurgitation is moderate to severe.  7. AV is thickened, calcified with restricted motion. Peak and mean gradients hrough the valve are 22 and 12 mm Hg respectively. AVA(VTI)1.72 cm2. DVI is 0. 53 consistent with mild aortic stenosis. .Aortic insuffiiency is very eccentric, severe... The aortic valve is tricuspid. Aortic valve regurgitation is severe.  8. The inferior vena cava is dilated in size with <  50% respiratory variability, suggesting right atrial pressure of 15 mmHg. FINDINGS  Left Ventricle: Apical window is foreshortened. Left ventricular ejection fraction, by estimation, is 35 to 40%. The left ventricle has moderately decreased function. The left ventricular internal cavity size was moderately dilated. There is mild left ventricular hypertrophy. Left ventricular diastolic parameters are indeterminate. Right Ventricle: The right ventricular size is normal. Right vetricular wall thickness was not assessed. Right ventricular systolic function is moderately reduced. There is moderately elevated pulmonary artery systolic pressure. The tricuspid regurgitant  velocity is 3.07 m/s, and with an assumed right atrial pressure of 8 mmHg, the estimated right ventricular systolic pressure is 73.5 mmHg. Left [facility]: Left atrial size was severely dilated. Right [facility]: Right atrial size was mildly dilated. Pericardium: There is no evidence of pericardial effusion. Mitral Valve: Moderate mitral stenosis with thickened / restricted valve and subvalvlular apparatus Mean gradient through the valve is 6 mm Hg (HR 100 bpm), relatively unchanged from previous echo. MR is severe. The mitral valve is  rheumatic. There is moderate calcification of the mitral valve leaflet(s). Severe mitral annular calcification. Severe mitral valve regurgitation. Moderate mitral valve stenosis. MV peak gradient, 16.0 mmHg. The mean mitral valve gradient is 6.0 mmHg. Tricuspid Valve: The tricuspid valve is normal in structure. Tricuspid valve regurgitation is moderate to severe. Aortic Valve: AV is thickened, calcified with restricted motion. Peak and mean gradients hrough the valve are 22 and 12 mm Hg respectively. AVA(VTI)1.72 cm2. DVI is 0. 53 consistent with mild aortic stenosis. .Aortic insuffiiency is very eccentric, severe. The aortic valve is tricuspid. Aortic valve regurgitation is severe. Aortic regurgitation PHT measures 271 msec. Aortic valve mean gradient measures 12.0 mmHg. Aortic valve peak gradient measures 22.6 mmHg. Aortic valve area, by VTI measures 1.78  cm. Pulmonic Valve: The pulmonic valve was grossly normal. Pulmonic valve regurgitation is mild. Aorta: The aortic root is normal in size and structure. Venous: The inferior vena cava is dilated in size with less than 50% respiratory variability, suggesting right atrial pressure of 15 mmHg. IAS/Shunts: No atrial level shunt detected by color flow Doppler.  LEFT VENTRICLE PLAX 2D LVIDd:         5.70 cm      Diastology LVIDs:         4.60 cm      LV e' medial:    8.38 cm/s LV PW:         1.20 cm      LV E/e' medial:  21.8 LV IVS:        1.20 cm      LV e' lateral:   14.10 cm/s LVOT diam:     2.10 cm      LV E/e' lateral: 13.0 LV SV:         64 LV SV Index:   43 LVOT Area:     3.46 cm  LV Volumes (MOD) LV vol d, MOD A2C: 215.0 ml LV vol d, MOD A4C: 168.0 ml LV vol s, MOD A2C: 137.0 ml LV vol s, MOD A4C: 101.0 ml LV SV MOD A2C:     78.0 ml LV SV MOD A4C:     168.0 ml LV SV MOD BP:      73.6 ml RIGHT VENTRICLE RV Basal diam:  3.80 cm RV Mid diam:    2.90 cm RV S prime:     9.14 cm/s TAPSE (M-mode): 1.5 cm LEFT ATRIUM  Index         RIGHT ATRIUM            Index LA diam:        4.50 cm  3.01 cm/m    RA Area:     17.60 cm LA Vol (A2C):   166.0 ml 111.21 ml/m  RA Volume:   45.50 ml  30.48 ml/m LA Vol (A4C):   111.0 ml 74.36 ml/m LA Biplane Vol: 143.0 ml 95.80 ml/m  AORTIC VALVE                     PULMONIC VALVE AV Area (Vmax):    1.68 cm      PV Vmax:          1.45 m/s AV Area (Vmean):   1.77 cm      PV Peak grad:     8.4 mmHg AV Area (VTI):     1.78 cm      PR End Diast Vel: 21.90 msec AV Vmax:           237.50 cm/s AV Vmean:          163.000 cm/s AV VTI:            0.360 m AV Peak Grad:      22.6 mmHg AV Mean Grad:      12.0 mmHg LVOT Vmax:         115.00 cm/s LVOT Vmean:        83.100 cm/s LVOT VTI:          0.185 m LVOT/AV VTI ratio: 0.51 AI PHT:            271 msec  AORTA Ao Root diam: 3.50 cm MITRAL VALVE                  TRICUSPID VALVE MV Area (PHT): 5.97 cm       TR Peak grad:   37.7 mmHg MV Area VTI:   1.79 cm       TR Vmax:        307.00 cm/s MV Peak grad:  16.0 mmHg MV Mean grad:  6.0 mmHg       SHUNTS MV Vmax:       2.00 m/s       Systemic VTI:  0.18 m MV Vmean:      106.5 cm/s     Systemic Diam: 2.10 cm MV Decel Time: 127 msec MR Peak grad:    99.0 mmHg MR Mean grad:    61.0 mmHg MR Vmax:         497.50 cm/s MR Vmean:        364.0 cm/s MR PISA:         10.62 cm MR PISA Eff ROA: 45 mm MR PISA Radius:  1.30 cm MV E velocity: 183.00 cm/s MV A velocity: 39.30 cm/s MV E/A ratio:  4.66 Dorris Carnes MD Electronically signed by Dorris Carnes MD Signature Date/Time: 11/30/2022/7:39:36 PM    Final    Medications:  levETIRAcetam Stopped (12/02/22 0531)    Chlorhexidine Gluconate Cloth  6 each Topical Daily   docusate sodium  100 mg Oral BID   feeding supplement (NEPRO CARB STEADY)  237 mL Oral TID WC   [START ON 12/03/2022] influenza vac split quadrivalent PF  0.5 mL Intramuscular Tomorrow-1000   multivitamin with minerals  1 tablet Oral Daily   nicotine  14 mg Transdermal Daily   polyethylene glycol  17  g Oral Daily   sevelamer carbonate  3,200 mg  Oral TID WC    Dialysis Orders:  TTS at [facility] 3:45hr, 400/A1.5, EDW 44kg, UFP #4, AVF, no heparin - Hectoral 27mcg IV q HD (just lowered from 56mcg). - Insurance will not approve Sensipar - no ESA - Last Phos 10.8 - supposed to be taking Renvela 4/meals   Assessment/Plan:  Acute respiratory failure/HF exacerbation: CXR with pulm edema, likely lost weight and needs lower EDW - tol UF 3L yesterday.  Improved today.   CHF: Cardiology consulted, TTE showing EF 35-40% -- thought to be valvular in origin but not a candidate for surgical repair.  Vol mgmt with dialysis.   ESRD:  TTS - had 12/5, next will be 12/7.     Hypertension/volume: BP controlled, UF with HD tomorrow 2-3L - standing post wt and new EDW at d/c.  Na/fluid restrict.   Anemia: Hgb 11.2 - no ESA yet.  Metabolic bone disease: Ca ok, Phos 10.7. Restarted home binders - suspect nonadherent. 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  Neurosurg following = plan repeat imaging 2-3wks, on keppra.   Jannifer Hick MD 12/02/2022, 11:08 AM  [facility] Pager: [phone]

## 2022-12-02 NOTE — Procedures (Signed)
EEG Procedure CPT/Type of Study: 15176; 24hr EEG with video Referring Provider: Opal Sidles Primary Neurological Diagnosis: AMS   History: This is a 41yr old patient, undergoing an EEG to evaluate for encephalopathy. Clinical State: disoriented   Technical Description:   The EEG was performed using standard setting per the guidelines of American Clinical Neurophysiology Society (ACNS).   A minimum of 21 electrodes were placed on scalp according to the International 10-20 or/and 10-10 Systems. Supplemental electrodes were placed as needed. Single EKG electrode was also used to detect cardiac arrhythmia. Patient's behavior was continuously recorded on video simultaneously with EEG. A minimum of 16 channels were used for data display. Each epoch of study was reviewed manually daily and as needed using standard referential and bipolar montages. Computerized quantitative EEG analysis (such as compressed spectral array analysis, trending, automated spike & seizure detection) were used as indicated.    Day 1: from 0820 12/01/22 to 0730 12/02/22   EEG Description: Overall Amplitude:Normal Predominant Frequency: The background activity showed posterior dominant alpha, with about 8-9 Hz, that was occasional. There was improving waking background after extubation. Superimposed Frequencies: frequent theta and some beta activity bilaterally The background was symmetric   Background Abnormalities: Generalized slowing; diffuse slowing which improved as sedation was weaned Rhythmic or periodic pattern: No Epileptiform activity: no Electrographic seizures: no Events: no    Breach rhythm: no   Reactivity: Present   Stimulation procedures:  Hyperventilation: not done Photic stimulation: no change   Sleep Background: Stage II   EKG:no significant arrhythmia   Impression: This was an abnormal continuous video EEG due to resolving diffuse background slowing, indicative of a resolving encephalopathy  pattern. There were no epileptiform discharges or seizures.

## 2022-12-02 NOTE — Progress Notes (Signed)
vLTM discontinued.  No skin breakdown noted at all skin sites.  [facility] notified 

## 2022-12-02 NOTE — Progress Notes (Signed)
  NEUROSURGERY PROGRESS NOTE   No issues overnight, extubated yesterday. Has some HA today, denies HA prior to yesterday. No c/o weakness, numbness/tingling.  EXAM:  BP 101/63   Pulse 84   Temp 98.7 F (37.1 C) (Oral)   Resp 19   Ht 5\' 4"  (1.626 m)   Wt 41.9 kg   LMP  (Approximate)   SpO2 99%   BMI 15.86 kg/m   Awake, alert, oriented  Speech fluent, appropriate  CN grossly intact  5/5 BUE/BLE, no pronator drift  IMPRESSION:  41 y.o. 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 Aside from SZ, SDH appears to be asymptomatic despite mild associated mass effect. As the patient is a very poor operative candidate and the SDH is essentially asymptomatic would attempt to treat conservatively.  PLAN: - Cont mgmt of fluid overload, ESRD, CHF per primary service - Cont Keppra 500mg  BID - Hold AC/AP agents - if she remains neurologically stable, plan would be for close outpatient clinical and radiologic follow-up (repeat CTH in 2-3 weeks).   Consuella Lose, MD [facility]

## 2022-12-02 NOTE — Progress Notes (Signed)
Pt receives out-pt HD at [facility] on TTS. Will assist as needed.   Lacey Dotson Renal Navigator [phone] 

## 2022-12-02 NOTE — Progress Notes (Addendum)
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  Diagnosed as AECHF.  This AM found down on floor and had ~8 min seizure ablated with ativan.  CBG noted very low question proximal cause.  Now very somnolent, PCCM consulted for further management.  Pertinent  Medical History   Past Medical History:  Diagnosis Date   Anemia    low iron - receives iron at dialysis   Anxiety    Arthritis    RA   Chronic systolic congestive heart failure ([place]) 03/16/2016   Dyspnea    ESRD (end stage renal disease) ([place])    [facility] _ [place]   H/O pericarditis 01/17/2013   H/O pleural effusion 01/17/2013   Heart murmur    Lupus (systemic lupus erythematosus) (HCC)    Previously followed with Dr. 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 pneumonae bacteremia on 01/21/2013. Sensitive to [facility] Events: Including procedures, antibiotic start and stop dates in addition to other pertinent events   12/5 admit and intubated. CT head for subdural collection over right cerebral hemisphere up to 43mm in thickness consistent with acute on chronic SDH with mass effect with 94mm leftward midline shift. Extubated in the  PM when mental status improved  Interim History / Subjective:  Extubated yesterday. Awake and requesting breakfast During the day had hypoglycemia, started on D5 Objective   Blood pressure (!) 109/58, pulse 92, temperature 98.7 F (37.1 C), temperature source Oral, resp. rate (!) 27, height 5\' 4"  (1.626 m), weight 41.9 kg, SpO2 97 %.    Vent Mode: PSV;CPAP FiO2 (%):  [40 %-100 %] 40 % Set Rate:  [18 bmp] 18 bmp Vt Set:  [430 mL] 430 mL PEEP:  [5 cmH20] 5 cmH20 Pressure Support:  [10 cmH20] 10 cmH20 Plateau Pressure:  [18 cmH20-20 cmH20] 18 cmH20   Intake/Output Summary (Last 24 hours) at 12/02/2022 0854 Last data filed at 12/01/2022 2300 Gross per 24 hour  Intake 62.61 ml  Output 3000 ml  Net -2937.39 ml   Filed Weights   12/01/22 1130 12/01/22 1628 12/02/22 0500  Weight: 45.4 kg 42.4 kg 41.9 kg   Physical Exam: General: Chronically ill-appearing, head covered in dressing, EEG leads in place, no acute distress HENT: [place], AT, OP clear, MMM Eyes: EOMI, no scleral icterus Respiratory: Clear to auscultation bilaterally.  No crackles, wheezing or rales Cardiovascular: RRR, SEM, no JVD GI: BS+, soft, nontender Extremities: RUE fistula +bruit/thrill, -Edema,-tenderness Neuro: AAO x4, CNII-XII grossly intact Psych: Normal mood, normal affect  Imaging, labs and test noted above have been reviewed independently by me. CXR with pulm edema Improved phos Acceptable BMET Chronic anemia  Resolved Hospital  Problem list   N/A  Assessment & Plan:  Prolonged seizure secondary to suspected hypoglycemia Acute metabolic encephalopathy- sugars low but was found down, unclear if cause or effect of seizure.  Has history of seizures during pregnancy. Tenuous resp status prompting ICU transfer.   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 Continue LTM,  Keppra -NSGY contacted. No plan for OR today -Seizure precautions -Holding anticoagulation, antiplatelets  Acute hypoxemic respiratory failure 2/2 pulm edema - improved Attempted to wean to room air with desaturations -Intubated for seizure and then extubated yesterday -Wean supplemental O2 >88% -Dialysis per Nephro schedule  ESRD, HFrEF, valvular insufficiency with hyperkalemia, volume overload -Last session 12/5 -Nephrology consulted  HTN Chronic systolic heart failure (EF 35-40%) Severe MR/AR Demand ischemia Peak 428 -Lost to Cardiology follow-up 2 years ago -Not a candidate for intervention -Coreg  Severe protein calorie malnutrition- POA  Hx SLE- not on DMARDs at present   Best Practice (right click and "Reselect all SmartList Selections" daily)   Diet/type: Regular consistency (see orders) DVT prophylaxis: not indicated GI prophylaxis: N/A Lines: N/A Foley:  N/A Code Status:  full code Last date of multidisciplinary goals of care discussion [Mother notified by IMTS]   Critical care time: 30 minutes    Rodman Pickle, M.D. [facility] Pulmonary/Critical Care Medicine 12/02/2022 8:54 AM   See Amion for personal pager For hours between 7 PM to 7 AM, please call Elink for urgent questions

## 2022-12-02 NOTE — Progress Notes (Signed)
LTM maint complete - no skin breakdown under: Fp1 Fp2 . Leads untangled, computer restarted, study online [facility] monitored, Event button test confirmed by [facility].

## 2022-12-02 NOTE — Progress Notes (Signed)
CSW met with pt at bedside. Pt reported that she doesn't feel that substance use is an issue and that her last use was due to the holiday and is not a repetitive behavior. CSW provided pt with substances use resources should she need them in the future.   Beckey Rutter, MSW, LCSWA, LCASA Transitions of Care  Clinical Social Worker I

## 2022-12-02 NOTE — Progress Notes (Signed)
Pharmacist Heart Failure Core Measure Documentation  Assessment: [name] has an EF documented as 35-40% on 11/30/2022 by Dorris Carnes, MD.  Rationale: Heart failure patients with left ventricular systolic dysfunction (LVSD) and an EF < 40% should be prescribed an angiotensin converting enzyme inhibitor (ACEI) or angiotensin receptor blocker (ARB) at discharge unless a contraindication is documented in the medical record.  This patient is not currently on an ACEI or ARB for HF.  This note is being placed in the record in order to provide documentation that a contraindication to the use of these agents is present for this encounter.  ACE Inhibitor or Angiotensin Receptor Blocker is contraindicated (specify all that apply)  []   ACEI allergy AND ARB allergy []   Angioedema []   Moderate or severe aortic stenosis []   Hyperkalemia [x]   Hypotension []   Renal artery stenosis []   Worsening renal function, preexisting renal disease or dysfunction   [name] 12/02/2022 12:49 PM

## 2022-12-02 NOTE — Progress Notes (Signed)
Neurology Progress Note  Brief HPI:  42 y.o. female with history of end-stage renal disease on dialysis (T, TH, S), lupus, CHF, aortic regurgitation mitral valve regurgitation, nonadherence to medical regimen as well as occasional drug use who originally presented with increasing dyspnea and had an 8-minute seizure this morning, possibly due to hypoglycemia. On exam, patient appeared postictal, did not respond to voice or follow commands and did not move left arm.  Patient was intubated for airway protection, and stat CT head was obtained demonstrating 19 mm subdural collection on the right, acute on chronic subdural hematoma. She is not on AEDs at baseline  AEDs: Keppra 500mg  BID  Subjective: Patient seen in room. She is able to tell me that her first seizure occurred when she was pregnant years ago.   Exam: Vitals:   12/02/22 0500 12/02/22 0600  BP: (!) 102/59 (!) 109/58  Pulse:  92  Resp: 20 (!) 27  Temp:    SpO2:  97%   Gen: In bed, NAD Resp: non-labored breathing, no acute distress Abd: soft, nt  Neuro: Mental Status: Drowsy, opens eyes to stimulation.  Speech is soft, but clear. She does not remember any events from yesterday.  Cranial Nerves: II: Visual Fields are full. PERRL.   III,IV, VI: EOMI without ptosis or diploplia.  V: Facial sensation is symmetric to temperature VII: Facial movement is symmetric resting and smiling VIII: Hearing is intact to voice X: Palate elevates symmetrically XI: Shoulder shrug is symmetric. XII: Tongue protrudes midline without atrophy or fasciculations.  Motor: Tone is normal. Bulk is normal.  Moves all extremities antigravity, generalized weakness.  Sensory: Sensation is symmetric to light touch in the arms and legs. Cerebellar: FNF slow but intact bilaterally Gait: steady or deferred for safety    Pertinent Labs: Glucose 12/5 @2001 - 35  -> most recent 130 BUN/Cr- 54/7.64 Na 133  Imaging Reviewed:  cEEG- 0820 12/01/22 to  0730 12/02/22 - This was an abnormal continuous video EEG due to resolving diffuse background slowing, indicative of a resolving encephalopathy pattern. There were no epileptiform discharges or seizures.    Assessment:  41 year old patient with history of end-stage renal disease, CHF, lupus, aortic regurgitation and mitral valve regurgitation was initially admitted for dyspnea and was found to have an 8-minute seizure this morning, likely due to hypoglycemia. She was found on the floor at the time this happened with an unwitnessed fall. Seizure terminated with administration of Ativan and dextrose. Patient was intubated for airway protection. On exam, patient was noted to be postictal but was not moving left arm, so CT head was obtained revealing right-sided subdural hematoma  Extubated yesterday and remains on LTM She had some hypoglycemic episodes overnight as well. She is still on D5NS0.9 at 67ml/hr and keppra 500mg  BID. She is not on AEDs at baseline.  NSGY recommending medical management of seizures and will consider intervention once she is stabilized.   Impression:  Seizure in the setting of hypoglycemia  Recommendations: - Continue IVF with dextrose to avoid hypoglycemia  - d/c LTM - Continue keppra 500mg  bid   Patient seen and examined by NP/APP with MD. MD to update note as needed.   Janine Ores, DNP, FNP-BC [facility] Pager: [phone]   Attending Neurohospitalist Addendum Patient seen and examined with APP/Resident. Agree with the history and physical as documented above. Agree with the plan as documented, which I helped formulate. I have edited the note above to reflect my full findings and recommendations. I have  independently reviewed the chart, obtained history, review of systems and examined the patient.I have personally reviewed pertinent head/neck/spine imaging (CT/MRI). Please feel free to call with any questions.  -- Su Monks, MD [facility] [phone]  If 7pm- 7am, please page neurology on call as listed in [place].

## 2022-12-03 DIAGNOSIS — R569 Unspecified convulsions: Secondary | ICD-10-CM | POA: Diagnosis not present

## 2022-12-03 DIAGNOSIS — S065XAA Traumatic subdural hemorrhage with loss of consciousness status unknown, initial encounter: Secondary | ICD-10-CM | POA: Diagnosis not present

## 2022-12-03 DIAGNOSIS — I5023 Acute on chronic systolic (congestive) heart failure: Secondary | ICD-10-CM | POA: Diagnosis not present

## 2022-12-03 LAB — GLUCOSE, CAPILLARY
Glucose-Capillary: 100 mg/dL — ABNORMAL HIGH (ref 70–99)
Glucose-Capillary: 110 mg/dL — ABNORMAL HIGH (ref 70–99)
Glucose-Capillary: 123 mg/dL — ABNORMAL HIGH (ref 70–99)
Glucose-Capillary: 128 mg/dL — ABNORMAL HIGH (ref 70–99)
Glucose-Capillary: 132 mg/dL — ABNORMAL HIGH (ref 70–99)
Glucose-Capillary: 157 mg/dL — ABNORMAL HIGH (ref 70–99)
Glucose-Capillary: 82 mg/dL (ref 70–99)
Glucose-Capillary: 87 mg/dL (ref 70–99)
Glucose-Capillary: 92 mg/dL (ref 70–99)
Glucose-Capillary: 93 mg/dL (ref 70–99)
Glucose-Capillary: 97 mg/dL (ref 70–99)

## 2022-12-03 LAB — RENAL FUNCTION PANEL
Albumin: 2.3 g/dL — ABNORMAL LOW (ref 3.5–5.0)
Anion gap: 13 (ref 5–15)
BUN: 70 mg/dL — ABNORMAL HIGH (ref 6–20)
CO2: 28 mmol/L (ref 22–32)
Calcium: 9.6 mg/dL (ref 8.9–10.3)
Chloride: 94 mmol/L — ABNORMAL LOW (ref 98–111)
Creatinine, Ser: 9.66 mg/dL — ABNORMAL HIGH (ref 0.44–1.00)
GFR, Estimated: 5 mL/min — ABNORMAL LOW (ref >60)
Glucose, Bld: 112 mg/dL — ABNORMAL HIGH (ref 70–99)
Phosphorus: 7.2 mg/dL — ABNORMAL HIGH (ref 2.5–4.6)
Potassium: 3.9 mmol/L (ref 3.5–5.1)
Sodium: 135 mmol/L (ref 135–145)

## 2022-12-03 LAB — CBC WITH DIFFERENTIAL/PLATELET
Abs Immature Granulocytes: 0.01 10*3/uL (ref 0.00–0.07)
Basophils Absolute: 0 10*3/uL (ref 0.0–0.1)
Basophils Relative: 0 %
Eosinophils Absolute: 0.2 10*3/uL (ref 0.0–0.5)
Eosinophils Relative: 3 %
HCT: 33.1 % — ABNORMAL LOW (ref 36.0–46.0)
Hemoglobin: 10.4 g/dL — ABNORMAL LOW (ref 12.0–15.0)
Immature Granulocytes: 0 %
Lymphocytes Relative: 20 %
Lymphs Abs: 1.1 10*3/uL (ref 0.7–4.0)
MCH: 29.4 pg (ref 26.0–34.0)
MCHC: 31.4 g/dL (ref 30.0–36.0)
MCV: 93.5 fL (ref 80.0–100.0)
Monocytes Absolute: 0.8 10*3/uL (ref 0.1–1.0)
Monocytes Relative: 14 %
Neutro Abs: 3.6 10*3/uL (ref 1.7–7.7)
Neutrophils Relative %: 63 %
Platelets: 234 10*3/uL (ref 150–400)
RBC: 3.54 MIL/uL — ABNORMAL LOW (ref 3.87–5.11)
RDW: 16.5 % — ABNORMAL HIGH (ref 11.5–15.5)
WBC: 5.8 10*3/uL (ref 4.0–10.5)
nRBC: 0 % (ref 0.0–0.2)

## 2022-12-03 MED ORDER — LEVETIRACETAM 500 MG PO TABS
500.0000 mg | ORAL_TABLET | Freq: Two times a day (BID) | ORAL | Status: DC
  Administered 2022-12-03 – 2022-12-06 (×7): 500 mg via ORAL
  Filled 2022-12-03 (×7): qty 1

## 2022-12-03 MED ORDER — DIPHENHYDRAMINE HCL 50 MG/ML IJ SOLN
25.0000 mg | Freq: Once | INTRAMUSCULAR | Status: AC
  Administered 2022-12-03: 25 mg via INTRAVENOUS
  Filled 2022-12-03: qty 1

## 2022-12-03 MED ORDER — DIPHENHYDRAMINE HCL 25 MG PO CAPS
25.0000 mg | ORAL_CAPSULE | Freq: Four times a day (QID) | ORAL | Status: DC | PRN
  Administered 2022-12-03 – 2022-12-06 (×5): 25 mg via ORAL
  Filled 2022-12-03 (×5): qty 1

## 2022-12-03 NOTE — Progress Notes (Signed)
   12/03/22 1430  Vitals  Temp 97.6 F (36.4 C)  Temp Source Oral  BP (!) 99/54  MAP (mmHg) 68  BP Location Left Arm  BP Method Automatic  Patient Position (if appropriate) Lying  Pulse Rate 87  Pulse Rate Source Monitor  ECG Heart Rate 87  Resp 20  Oxygen Therapy  SpO2 99 %  O2 Device Nasal Cannula  O2 Flow Rate (L/min) 2 L/min  During Treatment Monitoring  Blood Flow Rate (mL/min) 350 mL/min  Arterial Pressure (mmHg) -150 mmHg  Venous Pressure (mmHg) 250 mmHg  TMP (mmHg) 4 mmHg  Ultrafiltration Rate (mL/min) 1027 mL/min  Dialysate Flow Rate (mL/min) 300 ml/min  HD Safety Checks Performed Yes  Intra-Hemodialysis Comments Tx completed  Post Treatment  Duration of HD Treatment -hour(s) 3.45 hour(s)  Liters Processed 76.1  Fluid Removed (mL) 3000 mL  Tolerated HD Treatment Yes  Post-Hemodialysis Comments hd tx completed no problems to note  AVG/AVF Arterial Site Held (minutes) 5 minutes  AVG/AVF Venous Site Held (minutes) 5 minutes  Fistula / Graft Right Upper arm  Placement Date/Time: 12/18/19 (c) 1900   Placed prior to admission: Yes  Orientation: Right  Access Location: Upper arm  Site Condition No complications  Fistula / Graft Assessment Present;Thrill;Bruit  Status Deaccessed

## 2022-12-03 NOTE — Progress Notes (Signed)
Neurology Progress Note  Brief HPI:  41 y.o. female with history of end-stage renal disease on dialysis (T, TH, S), lupus, CHF, aortic regurgitation mitral valve regurgitation, nonadherence to medical regimen as well as occasional drug use who originally presented with increasing dyspnea and had an 8-minute seizure this morning, possibly due to hypoglycemia. On exam, patient appeared postictal, did not respond to voice or follow commands and did not move left arm.  Patient was intubated for airway protection, and stat CT head was obtained demonstrating 19 mm subdural collection on the right, acute on chronic subdural hematoma. She is not on AEDs at baseline  AEDs: Keppra 500mg  BID  Subjective: Patient seen in room. Drowsy but answers orientation questions appropriately. Dialysis scheduled for today.   Exam: Vitals:   12/03/22 0400 12/03/22 0600  BP: 106/70 108/60  Pulse: 84 91  Resp: 17 20  Temp: 98.1 F (36.7 C)   SpO2: 98% 98%   Gen: In bed, NAD Resp: non-labored breathing, no acute distress Abd: soft, nt  Neuro: Mental Status: Drowsy, opens eyes to stimulation. Oriented x4 Speech is soft, but clear.  Cranial Nerves: II: Visual Fields are full. PERRL.   III,IV, VI: EOMI without ptosis or diploplia.  V: Facial sensation is symmetric to temperature VII: Facial movement is symmetric resting and smiling VIII: Hearing is intact to voice X: Palate elevates symmetrically XI: Shoulder shrug is symmetric. XII: Tongue protrudes midline without atrophy or fasciculations.  Motor: Tone is normal. Bulk is normal.  Moves all extremities antigravity, generalized weakness.  Sensory: Sensation is symmetric to light touch in the arms and legs. Cerebellar: FNF slow but intact bilaterally Gait: steady or deferred for safety    Pertinent Labs: Glucose 12/5 @2001 - 35  -> most recent 130 ->122 BUN/Cr- 54/7.64 Na 133 -> 135  Imaging Reviewed:  cEEG- 0820 12/01/22 to 0730 12/02/22 - This  was an abnormal continuous video EEG due to resolving diffuse background slowing, indicative of a resolving encephalopathy pattern. There were no epileptiform discharges or seizures.    Assessment:  41 year old patient with history of end-stage renal disease, CHF, lupus, aortic regurgitation and mitral valve regurgitation was initially admitted for dyspnea and was found to have an 8-minute seizure this morning, likely due to hypoglycemia. She was found on the floor at the time this happened with an unwitnessed fall. Seizure terminated with administration of Ativan and dextrose. Patient was intubated for airway protection. On exam, patient was noted to be postictal but was not moving left arm, so CT head was obtained revealing right-sided subdural hematoma   Currently on keppra 500mg  BID. She is not on AEDs at baseline. HD scheduled for today  NSGY recommending medical management of seizures and will consider intervention once she is stabilized.   Impression:  Seizure in the setting of hypoglycemia  Recommendations: - Continue IVF with dextrose to avoid hypoglycemia  - Continue keppra 500mg  bid - Neurology to sign off, please re-engage if additional neurologic concerns arise   Patient seen and examined by NP/APP with MD. MD to update note as needed.   Janine Ores, DNP, FNP-BC [facility] Pager: [phone]   Attending Neurohospitalist Addendum Patient seen and examined with APP/Resident. Agree with the history and physical as documented above. Agree with the plan as documented, which I helped formulate. I have edited the note above to reflect my full findings and recommendations. I have independently reviewed the chart, obtained history, review of systems and examined the patient.I have personally reviewed pertinent  head/neck/spine imaging (CT/MRI). Please feel free to call with any questions.  -- Su Monks, MD [facility] [phone]  If 7pm- 7am,  please page neurology on call as listed in [place].

## 2022-12-03 NOTE — Progress Notes (Signed)
[facility] Progress Note   Subjective:  No new issues.  For HD today.  Pt reports O2 fell off last night and she dropped to 89%.     Objective Vitals:   12/03/22 0400 12/03/22 0459 12/03/22 0600 12/03/22 0800  BP: 106/70  108/60   Pulse: 84  91   Resp: 17  20   Temp: 98.1 F (36.7 C)   98.2 F (36.8 C)  TempSrc: Oral   Oral  SpO2: 98%  98%   Weight:  42 kg    Height:       Physical Exam General: awake, alert, comfortable Heart:RRR no rub Lungs: coarse BL, a few basilar rales Abdomen: soft, thin Extremities: no edema Dialysis Access:  RUE AVF +t/b  Additional Objective Labs: Basic Metabolic Panel: Recent Labs  Lab 11/30/22 1505 12/01/22 0330 12/01/22 0747 12/01/22 1025 12/02/22 0606  NA  --  139 137 135 133*  K  --  5.7* 5.9* 5.1 3.7  CL  --  95* 96*  --  93*  CO2  --  24 18*  --  26  GLUCOSE  --  59* 230*  --  81  BUN  --  83* 87*  --  54*  CREATININE  --  11.50* 11.51*  --  7.64*  CALCIUM  --  9.8 10.5*  --  9.1  PHOS 9.5* 10.7*  --   --  7.0*    Liver Function Tests: Recent Labs  Lab 12/01/22 0330 12/02/22 0606  ALBUMIN 2.5* 2.2*    No results for input(s): "LIPASE", "AMYLASE" in the last 168 hours. CBC: Recent Labs  Lab 11/30/22 0806 12/01/22 0330 12/01/22 1025 12/02/22 0606  WBC 5.2 5.1  --  6.8  HGB 11.2* 10.9* 11.6* 11.6*  HCT 35.1* 34.3* 34.0* 35.4*  MCV 94.6 92.2  --  90.3  PLT 236 233  --  247    Blood Culture    Component Value Date/Time   SDES SYNOVIAL FLUID 04/14/2022 1440   SPECREQUEST LEFT SHOULDER 04/14/2022 1440   CULT  04/14/2022 1440    No growth aerobically or anaerobically. Performed at [facility], [place] [address], [place], [place] [address]    REPTSTATUS 04/19/2022 FINAL 04/14/2022 1440    Cardiac Enzymes: No results for input(s): "CKTOTAL", "CKMB", "CKMBINDEX", "TROPONINI" in the last 168 hours. CBG: Recent Labs  Lab 12/03/22 0343 12/03/22 0450 12/03/22 0543 12/03/22 0633  12/03/22 0757  GLUCAP 92 128* 132* 123* 100*    Iron Studies:  Recent Labs    12/01/22 0330  IRON 192*  TIBC 224*  FERRITIN 1,129*    @lablastinr3 @ Studies/Results: Overnight EEG with video  Result Date: 12/02/2022 Samuella Cota, MD     12/02/2022  8:34 AM EEG Procedure CPT/Type of Study: 03888; 24hr EEG with video Referring Provider: Opal Sidles Primary Neurological Diagnosis: AMS  History: This is a 41yr old patient, undergoing an EEG to evaluate for encephalopathy. Clinical State: disoriented  Technical Description:  The EEG was performed using standard setting per the guidelines of American Clinical Neurophysiology Society (ACNS).  A minimum of 21 electrodes were placed on scalp according to the International 10-20 or/and 10-10 Systems. Supplemental electrodes were placed as needed. Single EKG electrode was also used to detect cardiac arrhythmia. Patient's behavior was continuously recorded on video simultaneously with EEG. A minimum of 16 channels were used for data display. Each epoch of study was reviewed manually daily and as needed using standard referential and bipolar  montages. Computerized quantitative EEG analysis (such as compressed spectral array analysis, trending, automated spike & seizure detection) were used as indicated.  Day 1: from 0820 12/01/22 to 0730 12/02/22  EEG Description: Overall Amplitude:Normal Predominant Frequency: The background activity showed posterior dominant alpha, with about 8-9 Hz, that was occasional. There was improving waking background after extubation. Superimposed Frequencies: frequent theta and some beta activity bilaterally The background was symmetric  Background Abnormalities: Generalized slowing; diffuse slowing which improved as sedation was weaned Rhythmic or periodic pattern: No Epileptiform activity: no Electrographic seizures: no Events: no  Breach rhythm: no  Reactivity: Present  Stimulation procedures: Hyperventilation: not done Photic stimulation:  no change  Sleep Background: Stage II  EKG:no significant arrhythmia  Impression: This was an abnormal continuous video EEG due to resolving diffuse background slowing, indicative of a resolving encephalopathy pattern. There were no epileptiform discharges or seizures.   CT HEAD WO CONTRAST (5MM)  Addendum Date: 12/01/2022   ADDENDUM REPORT: 12/01/2022 11:30 ADDENDUM: These results were called by telephone at the time of interpretation on 12/01/2022 at 11:09 am to provider [facility] ELLISON , who verbally acknowledged these results. Electronically Signed   By: Kellie Simmering D.O.   On: 12/01/2022 11:30   Result Date: 12/01/2022 CLINICAL DATA:  Provided history: Mental status change, unknown cause. EXAM: CT HEAD WITHOUT CONTRAST TECHNIQUE: Contiguous axial images were obtained from the base of the skull through the vertex without intravenous contrast. RADIATION DOSE REDUCTION: This exam was performed according to the departmental dose-optimization program which includes automated exposure control, adjustment of the mA and/or kV according to patient size and/or use of iterative reconstruction technique. COMPARISON:  Prior head CT examinations 01/01/2018 and earlier. FINDINGS: Brain: Age advanced parenchymal atrophy. Mixed density subdural collection overlying the right cerebral hemisphere, measuring up to 19 mm in thickness. The collection is predominantly intermediate to low density. However, there are also small volume hyperdense components. 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 Mass effect upon the underlying left cerebral hemisphere (greatest upon the left parietal lobe). 2 mm leftward midline shift. Prominent calcifications within the bilateral corona radiata, basal ganglia and cerebellar white matter, progressed from the prior head CT of 01/01/2018. Partially empty sella turcica. No demarcated cortical infarct. No evidence of an intracranial mass.  Vascular: No hyperdense vessel. Skull: No fracture or aggressive osseous lesion. Sinuses/Orbits: Bilateral proptosis. Minimal mucosal thickening scattered within the paranasal sinuses. Attempts are being made to reach the ordering provider at this time. IMPRESSION: Subdural collection overlying the right cerebral hemisphere, measuring up to 19 mm in thickness. 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 Mass effect upon the underlying right cerebral hemisphere with 2 mm leftward midline shift. Prominent calcifications within the bilateral corona radiata, basal ganglia and cerebellar white matter, progressed from the prior head CT of 01/01/2018. A history of end-stage renal disease is noted in the patient's medical chart, and these findings may be related to secondary hyperparathyroidism. Age advanced parenchymal atrophy. Bilateral proptosis. Electronically Signed: By: Kellie Simmering D.O. On: 12/01/2022 10:52   DG CHEST PORT 1 VIEW  Result Date: 12/01/2022 CLINICAL DATA:  Endotracheal tube placement and enteric tube placement. EXAM: PORTABLE CHEST 1 VIEW COMPARISON:  12/01/2022 at 7:53 a.m. FINDINGS: Endotracheal tube has tip 4.2 cm above the carina. Enteric tube courses into the region of the stomach and off the film as tip is not visualized, although side-port lies within  the stomach in the left upper quadrant. Lungs are adequately inflated demonstrate hazy bilateral perihilar opacification suggesting interstitial edema and less likely infection. Persistent left basilar opacification likely atelectasis/effusion unchanged. Mediastinal silhouette and remainder of the exam is unchanged. IMPRESSION: 1. Hazy bilateral perihilar opacification suggesting interstitial edema and less likely infection. Persistent left basilar opacification likely atelectasis/effusion. 2. Tubes and lines as described. Electronically Signed   By: Marin Olp M.D.   On: 12/01/2022  09:35   Medications:  levETIRAcetam Stopped (12/02/22 2146)    Chlorhexidine Gluconate Cloth  6 each Topical Daily   docusate sodium  100 mg Oral BID   feeding supplement (NEPRO CARB STEADY)  237 mL Oral TID WC   influenza vac split quadrivalent PF  0.5 mL Intramuscular Tomorrow-1000   multivitamin  1 tablet Oral QHS   nicotine  14 mg Transdermal Daily   polyethylene glycol  17 g Oral Daily   sevelamer carbonate  3,200 mg Oral TID WC    Dialysis Orders:  TTS at [facility] 3:45hr, 400/A1.5, EDW 44kg, UFP #4, AVF, no heparin - Hectoral 65mcg IV q HD (just lowered from 6mcg). - Insurance will not approve Sensipar - no ESA - Last Phos 10.8 - supposed to be taking Renvela 4/meals   Assessment/Plan:  Acute respiratory failure/HF exacerbation: CXR with pulm edema, likely lost weight and needs lower EDW.  Tole 3L UF on 12/5 - try for same today.   CHF: Cardiology consulted, TTE showing EF 35-40% -- thought to be valvular in origin but not a candidate for surgical repair.  Vol mgmt with dialysis.   ESRD:  TTS - had 12/5, next today.     Hypertension/volume: BP controlled, UF with HD tomorrow 2-3L - standing post wt and new EDW at d/c.  Na/fluid restrict.   Anemia: Hgb 11.2 - no ESA yet.  Metabolic bone disease: Ca ok, Phos 10.7. Restarted home binders - suspect nonadherent. 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  Neurosurg following = plan repeat imaging 2-3wks, on keppra.  Hypoglycemia - at time of seizure - was on dextrose IV yesterday but BG has been ok now.  She is not diabetic.  Jannifer Hick MD 12/03/2022, 8:39 AM  [facility] Pager: [phone]

## 2022-12-03 NOTE — Progress Notes (Signed)
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  Diagnosed as AECHF.  This AM found down on floor and had ~8 min seizure ablated with ativan.  CBG noted very low question proximal cause.  Now very somnolent, PCCM consulted for further management.  Pertinent  Medical History   Past Medical History:  Diagnosis Date   Anemia    low iron - receives iron at dialysis   Anxiety    Arthritis    RA   Chronic systolic congestive heart failure ([place]) 03/16/2016   Dyspnea    ESRD (end stage renal disease) ([place])    [facility]    H/O pericarditis 01/17/2013   H/O pleural effusion 01/17/2013   Heart murmur    Lupus (systemic lupus erythematosus) (H[facility])    Previously followed with Dr. 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 pneumonae bacteremia on 01/21/2013. Sensitive to [facility] Events: Including procedures, antibiotic start and stop dates in addition to other pertinent events   12/5 admit and intubated. CT head for subdural collection over right cerebral hemisphere up to 6mm in thickness consistent with acute on chronic SDH with mass effect with 51mm leftward midline shift. Extubated in the  PM when mental status improved 12/6 LTM discontinued  Interim History / Subjective:  SpO2 83% on room air while sleeping. Improved to >90% with 2L Very poor appetite Weaned off D5 this morning Objective   Blood pressure 108/60, pulse 91, temperature 98.1 F (36.7 C), temperature source Oral, resp. rate 20, height 5\' 4"  (1.626 m), weight 42 kg, SpO2 98 %.        Intake/Output Summary (Last 24 hours) at 12/03/2022 8338 Last data filed at 12/02/2022 2300 Gross per 24 hour  Intake 387 ml  Output --  Net 387 ml   Filed Weights   12/01/22 1628 12/02/22 0500 12/03/22 0459  Weight: 42.4 kg 41.9 kg 42 kg   Physical Exam: General: Chronically ill-appearing, no acute distress HENT: [place], AT, OP clear, MMM Eyes: EOMI, no scleral icterus Respiratory: Diminished bibasilar breath sounds, no wheezes Cardiovascular: RRR, -M/R/G, no JVD GI: BS+, soft, nontender Extremities: RUE fistula, +bruit/thrill, -Edema,-tenderness Neuro: AAO x3, CNII-XII grossly intact  Imaging, labs and test noted above have been reviewed independently by me.  CXR 12/5  pulm edema No labs this am   Resolved Hospital Problem list   N/A  Assessment & Plan:  Prolonged seizure secondary to suspected hypoglycemia Acute metabolic encephalopathy- sugars low but was found down, unclear if cause or effect of seizure.  Has history of seizures during pregnancy. Tenuous resp status prompting ICU transfer.   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 LTM discontinued. Continue Keppra -NSGY following. Plan for outpatient follow-up with CT head in 2-3 weeks per note -Seizure precautions -Holding anticoagulation, antiplatelets  Acute hypoxemic respiratory failure 2/2 pulmonary edema - improved Attempted to wean to room air however desaturations today -Intubated for seizure and then  extubated 12/5 -Wean supplemental O2 >88% -Dialysis today per Nephro schedule  ESRD, HFrEF, valvular insufficiency with hyperkalemia, volume overload -Last session 12/5 -Dialysis today -Nephrology consulted  HTN Chronic systolic heart failure (EF 35-40%) Severe MR/AR Demand ischemia Peak 428 -Lost to Cardiology follow-up 2 years ago -Not a candidate for intervention -Coreg  Severe protein calorie malnutrition- POA  Hx SLE- not on DMARDs at present  Transfer to IMTS. Agreed for pick -up 12/8  Best Practice (right click and "Reselect all SmartList Selections" daily)   Diet/type: Regular consistency (see orders) DVT prophylaxis: not indicated GI prophylaxis: N/A Lines: N/A Foley:  N/A Code Status:  full code Last date of multidisciplinary goals of care discussion [Mother notified by IMTS]   Critical care time: N/A    Care Time: 17 min  Rodman Pickle, M.D. [facility] Pulmonary/Critical Care Medicine 12/03/2022 8:21 AM   Please see Amion for pager number to reach on-call Pulmonary and Critical Care Team.

## 2022-12-04 DIAGNOSIS — I5023 Acute on chronic systolic (congestive) heart failure: Secondary | ICD-10-CM | POA: Diagnosis not present

## 2022-12-04 DIAGNOSIS — I132 Hypertensive heart and chronic kidney disease with heart failure and with stage 5 chronic kidney disease, or end stage renal disease: Secondary | ICD-10-CM

## 2022-12-04 DIAGNOSIS — J9601 Acute respiratory failure with hypoxia: Secondary | ICD-10-CM

## 2022-12-04 DIAGNOSIS — F17210 Nicotine dependence, cigarettes, uncomplicated: Secondary | ICD-10-CM

## 2022-12-04 DIAGNOSIS — S065XAA Traumatic subdural hemorrhage with loss of consciousness status unknown, initial encounter: Secondary | ICD-10-CM | POA: Insufficient documentation

## 2022-12-04 DIAGNOSIS — R569 Unspecified convulsions: Secondary | ICD-10-CM | POA: Diagnosis not present

## 2022-12-04 DIAGNOSIS — E162 Hypoglycemia, unspecified: Secondary | ICD-10-CM | POA: Diagnosis not present

## 2022-12-04 DIAGNOSIS — N186 End stage renal disease: Secondary | ICD-10-CM

## 2022-12-04 LAB — CBC
HCT: 35.1 % — ABNORMAL LOW (ref 36.0–46.0)
Hemoglobin: 11 g/dL — ABNORMAL LOW (ref 12.0–15.0)
MCH: 29.1 pg (ref 26.0–34.0)
MCHC: 31.3 g/dL (ref 30.0–36.0)
MCV: 92.9 fL (ref 80.0–100.0)
Platelets: 210 10*3/uL (ref 150–400)
RBC: 3.78 MIL/uL — ABNORMAL LOW (ref 3.87–5.11)
RDW: 17 % — ABNORMAL HIGH (ref 11.5–15.5)
WBC: 5.2 10*3/uL (ref 4.0–10.5)
nRBC: 0 % (ref 0.0–0.2)

## 2022-12-04 LAB — GLUCOSE, CAPILLARY
Glucose-Capillary: 87 mg/dL (ref 70–99)
Glucose-Capillary: 108 mg/dL — ABNORMAL HIGH (ref 70–99)
Glucose-Capillary: 97 mg/dL (ref 70–99)
Glucose-Capillary: 84 mg/dL (ref 70–99)
Glucose-Capillary: 162 mg/dL — ABNORMAL HIGH (ref 70–99)

## 2022-12-04 LAB — RENAL FUNCTION PANEL
Albumin: 2.4 g/dL — ABNORMAL LOW (ref 3.5–5.0)
Anion gap: 12 (ref 5–15)
BUN: 36 mg/dL — ABNORMAL HIGH (ref 6–20)
CO2: 29 mmol/L (ref 22–32)
Calcium: 9.6 mg/dL (ref 8.9–10.3)
Chloride: 93 mmol/L — ABNORMAL LOW (ref 98–111)
Creatinine, Ser: 6.13 mg/dL — ABNORMAL HIGH (ref 0.44–1.00)
GFR, Estimated: 8 mL/min — ABNORMAL LOW (ref >60)
Glucose, Bld: 103 mg/dL — ABNORMAL HIGH (ref 70–99)
Phosphorus: 5.3 mg/dL — ABNORMAL HIGH (ref 2.5–4.6)
Potassium: 3.8 mmol/L (ref 3.5–5.1)
Sodium: 134 mmol/L — ABNORMAL LOW (ref 135–145)

## 2022-12-04 MED ORDER — HYDROMORPHONE HCL 2 MG PO TABS: 1.0000 mg | ORAL_TABLET | Freq: Three times a day (TID) | ORAL | Status: DC | PRN

## 2022-12-04 NOTE — Progress Notes (Signed)
[facility] Progress Note   Subjective:  No new issues.  Tolerated HD fine yesterday -  UF 3L with post wt 41.7kg.  She says she will not discharge today but is unclear on plan.    Objective Vitals:   12/03/22 2255 12/04/22 0413 12/04/22 0723 12/04/22 1140  BP: (!) 112/57 (!) 107/56 (!) 109/54 (!) 107/52  Pulse: 98 87 83 82  Resp: 20 18 17 20   Temp: 98.1 F (36.7 C) 98.1 F (36.7 C) 97.9 F (36.6 C) 98.1 F (36.7 C)  TempSrc: Oral Oral Oral Oral  SpO2: 100% 94% 100% 95%  Weight: 42 kg     Height: 5\' 4"  (1.626 m)      Physical Exam General: sleepy but arousable, comfortable Heart:RRR no rub Lungs: clear Abdomen: soft, thin Extremities: no edema Dialysis Access:  RUE AVF +t/b  Additional Objective Labs: Basic Metabolic Panel: Recent Labs  Lab 12/02/22 0606 12/03/22 0951 12/04/22 0750  NA 133* 135 134*  K 3.7 3.9 3.8  CL 93* 94* 93*  CO2 26 28 29   GLUCOSE 81 112* 103*  BUN 54* 70* 36*  CREATININE 7.64* 9.66* 6.13*  CALCIUM 9.1 9.6 9.6  PHOS 7.0* 7.2* 5.3*    Liver Function Tests: Recent Labs  Lab 12/02/22 0606 12/03/22 0951 12/04/22 0750  ALBUMIN 2.2* 2.3* 2.4*    No results for input(s): "LIPASE", "AMYLASE" in the last 168 hours. CBC: Recent Labs  Lab 11/30/22 0806 12/01/22 0330 12/01/22 1025 12/02/22 0606 12/03/22 0951 12/04/22 0750  WBC 5.2 5.1  --  6.8 5.8 5.2  NEUTROABS  --   --   --   --  3.6  --   HGB 11.2* 10.9*   < > 11.6* 10.4* 11.0*  HCT 35.1* 34.3*   < > 35.4* 33.1* 35.1*  MCV 94.6 92.2  --  90.3 93.5 92.9  PLT 236 233  --  247 234 210   < > = values in this interval not displayed.    Blood Culture    Component Value Date/Time   SDES SYNOVIAL FLUID 04/14/2022 1440   SPECREQUEST LEFT SHOULDER 04/14/2022 1440   CULT  04/14/2022 1440    No growth aerobically or anaerobically. Performed at [facility], [place] [address] [place], [place] [address]    REPTSTATUS 04/19/2022 FINAL 04/14/2022 1440    Cardiac  Enzymes: No results for input(s): "CKTOTAL", "CKMB", "CKMBINDEX", "TROPONINI" in the last 168 hours. CBG: Recent Labs  Lab 12/03/22 2006 12/03/22 2302 12/04/22 0425 12/04/22 0721 12/04/22 1138  GLUCAP 93 82 87 108* 97    Iron Studies:  No results for input(s): "IRON", "TIBC", "TRANSFERRIN", "FERRITIN" in the last 72 hours.  @lablastinr3 @ Studies/Results: No results found. Medications:    Chlorhexidine Gluconate Cloth  6 each Topical Daily   docusate sodium  100 mg Oral BID   feeding supplement (NEPRO CARB STEADY)  237 mL Oral TID WC   influenza vac split quadrivalent PF  0.5 mL Intramuscular Tomorrow-1000   levETIRAcetam  500 mg Oral BID   multivitamin  1 tablet Oral QHS   nicotine  14 mg Transdermal Daily   polyethylene glycol  17 g Oral Daily   sevelamer carbonate  3,200 mg Oral TID WC    Dialysis Orders:  TTS at [facility] 3:45hr, 400/A1.5, EDW 44kg, UFP #4, AVF, no heparin - Hectoral 46mcg IV q HD (just lowered from 45mcg). - Insurance will not approve Sensipar - no ESA - Last Phos 10.8 - supposed  to be taking Renvela 4/meals   Assessment/Plan:  Acute respiratory failure/HF exacerbation: CXR with pulm edema, likely lost weight and needs lower EDW.  Achieving neg fluid balance with dialysis and now looks euvolemic and improved.  CHF: Cardiology consulted, TTE showing EF 35-40% -- thought to be valvular in origin but not a candidate for surgical repair.  Vol mgmt with dialysis.   ESRD:  TTS - had 12/5, next tomorrow.     Hypertension/volume: BP controlled, UF with HD tomorrow 2-3L - standing post wt and new EDW at d/c - appears euvolemic now.  Na/fluid restrict.   Anemia: Hgb 11.2 - no ESA yet.  Metabolic bone disease: Ca ok, Phos 10.7. Restarted home binders - suspect nonadherent. 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  Neurosurg following = plan repeat imaging 2-3wks, on keppra.  Hypoglycemia - at time of seizure - was on dextrose IV but BG has been ok now.  She is not  diabetic.  She is having nutrition supplemented between meals.  Dispo: of for d/c from my perspective - will have new outpt EDW at discharge.   Jannifer Hick MD 12/04/2022, 1:08 PM  [facility] Pager: [phone]

## 2022-12-04 NOTE — Progress Notes (Signed)
  NEUROSURGERY PROGRESS NOTE   No issues overnight. Pt reports no HA this am. No new N/T/W.   EXAM:  BP (!) 109/54   Pulse 83   Temp 97.9 F (36.6 C) (Oral)   Resp 17   Ht 5\' 4"  (1.626 m)   Wt 42 kg   LMP  (Approximate)   SpO2 100%   BMI 15.89 kg/m   Awake, alert, oriented  Speech fluent, appropriate  CN grossly intact  Good strength BUE/BLE, symmetric  IMPRESSION:  41 y.o. 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

## 2022-12-04 NOTE — Progress Notes (Signed)
Initial Nutrition Assessment  DOCUMENTATION CODES:   Underweight  INTERVENTION:  Encourage adequate PO intake Continue renal MVI with minerals daily Nepro Shake po TID, each supplement provides 425 kcal and 19 grams protein  NUTRITION DIAGNOSIS:   Increased nutrient needs related to chronic illness as evidenced by estimated needs.  GOAL:   Patient will meet greater than or equal to 90% of their needs  MONITOR:   PO intake, Supplement acceptance, Labs, Weight trends  REASON FOR ASSESSMENT:   Consult Assessment of nutrition requirement/status  ASSESSMENT:   Pt admitted with progressive SOB r/t acute on chronic HF. PMH significant for SLE c/b lupus nephritis c/b ESRD on HD, HFrEF (EF 30-35%), AR and MR.   Pt experienced seizure on 12/5, it appears unclear weather etiology  is r/tof R sided SDH versus hypoglycemia. She has remained with persistently hypoglycemia throughout admission requiring D10.   Spoke with pt via phone call to room. She reports that her appetite is slowing beginning to come back. She states that she did not eat great for breakfast but ate a little bit more for lunch.   Pt reports that her taste began to change about 1 week PTA which she feels is what has affected her appetite. She was previously eating 2-3 meals daily but with her decline in appetite, she has only been eating 1 meal daily. She feels that she typically eats better on dialysis days. Denies following any specific diet at home. Encouraged her to limited added phos in diet however increase amount of protein and continued use of nutrition supplements at home.   She agrees to continue nutrition supplements during admission and declines additional snacks as she feels as though the supplements between meals is enough for now.   Meal completions: 12/6: 30% breakfast  Last HD 12/7 with 3L net UF.   EDW 44kg.  Post HD weight (12/7) 42 kg  Pt is uncertain of any recent changes to her weight. She  feels as though her clothes fit about the same as usual.   Suspect pt to have a degree of malnutrition however unable to confirm at this time.   Medications: colace, rena-vit, miralax, renvela   Labs: sodium 134, BUN 36, Cr 6.13, GFR 8, CBG's 82-157 x24 hours  NUTRITION - FOCUSED PHYSICAL EXAM: RD working remotely. Deferred to follow up.   Diet Order:   Diet Order             Diet 2 gram sodium Room service appropriate? Yes; Fluid consistency: Thin  Diet effective now                   EDUCATION NEEDS:   Education needs have been addressed  Skin:  Skin Assessment: Reviewed RN Assessment  Last BM:  12/6  Height:   Ht Readings from Last 1 Encounters:  12/03/22 5\' 4"  (1.626 m)    Weight:   Wt Readings from Last 1 Encounters:  12/03/22 42 kg   BMI:  Body mass index is 15.89 kg/m.  Estimated Nutritional Needs:   Kcal:  1500-1700  Protein:  75-90g  Fluid:  1L + UOP  Clayborne Dana, RDN, LDN Clinical Nutrition

## 2022-12-04 NOTE — Progress Notes (Addendum)
Hospital Course:   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 On 12/5 patient was found down on the floor and had ~65min seizure that terminated with ativan. Her CBG was noted to be <10. She was confused and somnolent after this event, eventually requiring intubation 12/5. She was extubated on that same day. A CT head was obtained which showed a large R parietal convexity subdural hematoma measuring up to 2cm with ~55mm of R to L midline shift. It was unclear if patient's SDH precipitated her seizures or her hypoglycemia. It was recommended by NSGY that her seizures be medically managed and the patient follow up outpatient when she is medically stable to determine if surgical intervention is warranted. Patient's course was complicated by persistent low blood sugars necessitating D10  infusion briefly.   Subjective:   Patient states that she tolerated HD well yesterday. She has been able to eat some. She has had Bms. She does feel a little weak.   Objective:  Vital signs in last 24 hours: Vitals:   12/03/22 2200 12/03/22 2255 12/04/22 0413 12/04/22 0723  BP: (!) 94/53 (!) 112/57 (!) 107/56 (!) 109/54  Pulse: 82 98 87 83  Resp: 19 20 18 17   Temp:  98.1 F (36.7 C) 98.1 F (36.7 C) 97.9 F (36.6 C)  TempSrc:  Oral Oral Oral  SpO2: 98% 100% 94% 100%  Weight:  42 kg    Height:  5\' 4"  (1.626 m)     Constitutional: Chronically ill appearing and in no distress.  HENT:  Head: Normocephalic and atraumatic.  Eyes: EOM are normal.  Neck: Normal range of motion.  Cardiovascular: Normal rate, regular rhythm, intact distal pulses. Diastolic/Systolic murmur most prominent at LLSB. No lower extremity edema. +JVD Pulmonary: Non labored breathing on 2L [place], no wheezing or rales   Abdominal: Soft. Normal bowel sounds. Non distended and non tender Musculoskeletal: Normal range of motion. Decreased muscle bulk diffusely         General: No tenderness or edema.  Neurological: Alert and oriented to person, place, and time. BUE/BLE 5/5 strength Skin: Skin is warm and dry. 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   Seizure 2/2 hypoglycemia versus R sided SDH Unclear if patient's low blood sugars caused her seizure or if after becoming hypoglycemic she fell and her seizure was precipitated by her SDH. Patient was started on keppra and required D10 infusion to maintain her blood glucoses which has since been discontinued. NSGY evaluated patient regarding her R SDH and recommended she have close follow up in the outpatient setting with repeat imaging in 2-3 weeks.  -Neurologically stable  currently with no focal deficits on exam  -Normothermia/normoglycemia  -Continue Keppra -Outpt NSGY f/u with CT head in 2-3 weeks -Seizure precautions -Holding anticoagulation, antiplatelets   Regarding her hypoglycemia: Patient does not take insulin. It is possible that she became hypoglycemic in the setting of poor oral intake which she mentioned on admission and her ESRD. She has had lower blood pressures this hospitalization as well, and cortisol deficiency was considered.  -Meal  supplements  -Continue CBG q4h for now -RD consult given poor nutrition status    Acute hypoxemic respiratory failure 2/2 acute on chronic HFrEF Patient desaturations ON. Currently on 2L [place]. Her volume status is being managed with HD.  -Wean O2 to SpO2>92% -Continue HD   ESRD on HD T-Th-S possibly 2/2 lupus nephritis  Continue HD, last session 12/7 with 3L UF  HTN On coreg at home only. Hold in the setting of lower blood pressures.   HFrEF (EF 35 to 40% on TTE this hospitalization), cardiomyopathy unspecified likely NICM  Severe MR/AR/TVR Moderate mitral stenosis and mild aortic stenosis  Per cardiology, patient currently not candidate for intervention given her co morbidities and substance use.    Rick Duff, MD 12/04/2022, 8:02 AM After 5pm on weekdays and 1pm on weekends: On Call pager [phone]

## 2022-12-05 LAB — RENAL FUNCTION PANEL
Albumin: 2.3 g/dL — ABNORMAL LOW (ref 3.5–5.0)
Anion gap: 14 (ref 5–15)
BUN: 62 mg/dL — ABNORMAL HIGH (ref 6–20)
CO2: 27 mmol/L (ref 22–32)
Calcium: 10.2 mg/dL (ref 8.9–10.3)
Chloride: 95 mmol/L — ABNORMAL LOW (ref 98–111)
Creatinine, Ser: 7.48 mg/dL — ABNORMAL HIGH (ref 0.44–1.00)
GFR, Estimated: 7 mL/min — ABNORMAL LOW (ref >60)
Glucose, Bld: 86 mg/dL (ref 70–99)
Phosphorus: 6.3 mg/dL — ABNORMAL HIGH (ref 2.5–4.6)
Potassium: 4.6 mmol/L (ref 3.5–5.1)
Sodium: 136 mmol/L (ref 135–145)

## 2022-12-05 LAB — GLUCOSE, CAPILLARY
Glucose-Capillary: 113 mg/dL — ABNORMAL HIGH (ref 70–99)
Glucose-Capillary: 156 mg/dL — ABNORMAL HIGH (ref 70–99)
Glucose-Capillary: 66 mg/dL — ABNORMAL LOW (ref 70–99)
Glucose-Capillary: 70 mg/dL (ref 70–99)
Glucose-Capillary: 80 mg/dL (ref 70–99)
Glucose-Capillary: 81 mg/dL (ref 70–99)
Glucose-Capillary: 88 mg/dL (ref 70–99)

## 2022-12-05 LAB — FERRITIN: Ferritin: 360 ng/mL — ABNORMAL HIGH (ref 11–307)

## 2022-12-05 LAB — CBC
HCT: 35 % — ABNORMAL LOW (ref 36.0–46.0)
Hemoglobin: 11.4 g/dL — ABNORMAL LOW (ref 12.0–15.0)
MCH: 30.1 pg (ref 26.0–34.0)
MCHC: 32.6 g/dL (ref 30.0–36.0)
MCV: 92.3 fL (ref 80.0–100.0)
Platelets: 216 10*3/uL (ref 150–400)
RBC: 3.79 MIL/uL — ABNORMAL LOW (ref 3.87–5.11)
RDW: 16.7 % — ABNORMAL HIGH (ref 11.5–15.5)
WBC: 5.6 10*3/uL (ref 4.0–10.5)
nRBC: 0 % (ref 0.0–0.2)

## 2022-12-05 LAB — CORTISOL-AM, BLOOD: Cortisol - AM: 17.2 ug/dL (ref 6.7–22.6)

## 2022-12-05 LAB — IRON AND TIBC
Iron: 35 ug/dL (ref 28–170)
Saturation Ratios: 17 % (ref 10.4–31.8)
TIBC: 209 ug/dL — ABNORMAL LOW (ref 250–450)
UIBC: 174 ug/dL

## 2022-12-05 MED ORDER — DEXTROSE 50 % IV SOLN: 1.0000 | Freq: Once | INTRAVENOUS | Status: DC | PRN

## 2022-12-05 MED ORDER — DEXTROSE 50 % IV SOLN: 1.0000 | Freq: Once | INTRAVENOUS | Status: DC

## 2022-12-05 MED ORDER — MELATONIN 3 MG PO TABS
3.0000 mg | ORAL_TABLET | Freq: Every day | ORAL | Status: DC
  Administered 2022-12-05: 3 mg via ORAL
  Filled 2022-12-05: qty 1

## 2022-12-05 NOTE — Progress Notes (Signed)
Received patient in bed to unit.  Alert and oriented.  Informed consent signed and in chart.   Treatment initiated: 1311 Treatment completed: 1636  Patient tolerated well.  Transported back to the room  Alert, without acute distress.  Hand-off given to patient's nurse.   Access used: Catheter Access issues: none  Total UF removed: 2500 Medication(s) given: Benedryl, tylenol Post HD VS: 98, 100/43(61), HR-96, RR-21, SP02-99 Post HD weight: 42.4kg   [facility]

## 2022-12-05 NOTE — Progress Notes (Cosign Needed Addendum)
Hospital Course:   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 On 12/5 patient was found down on the floor and had ~61min seizure that terminated with ativan. Her CBG was noted to be <10. She was confused and somnolent after this event, eventually requiring intubation 12/5. She was extubated on that same day. A CT head was obtained which showed a large R parietal convexity subdural hematoma measuring up to 2cm with ~107mm of R to L midline shift. It was unclear if patient's SDH precipitated her seizures or her hypoglycemia. It was recommended by NSGY that her seizures be medically managed and the patient follow up outpatient when she is medically stable to determine if surgical intervention is warranted. Patient's course was complicated by persistent low blood sugars necessitating D10 infusion briefly.   Subjective:   Had an asymptomatic hypoglycemic episode this morning with glucose 66 on CBG. Improved with juice and a meal.   She states that she feels fine including earlier this morning when her sugar level was low, did not have any symptoms. She knows that she is getting dialyzed today. She feels like her breathing is improving gradually.     Objective:  Vital signs in last 24 hours: Vitals:   12/04/22 1614 12/04/22 2005 12/05/22 0404 12/05/22 0845  BP: (!) 105/49 (!) 112/58 (!) 117/46 (!) 116/59  Pulse: 86 84 84 82  Resp: 17 18 17 19   Temp: 97.9 F (36.6 C) 97.9 F (36.6 C) 97.9 F (36.6 C) 97.7 F (36.5 C)  TempSrc: Oral Oral Oral Oral  SpO2: 98% 100% 100% 97%  Weight:   44.3 kg   Height:       General: young female, sitting up in bed, NAD. HENT: [place]/AT. Eyes: PERRL, EOMI, anicteric sclerae. CV: normal rate and regular rhythm. No JVD noted. Murmur at LLSB, noted  previously as well.  Pulm: CTABL, no adventitious sounds noted. Saturating well on 2L [place] without distress. Abdomen: soft, ND, NT, normoactive bowel sounds. MSK: no peripheral edema. Sarcopenic. Skin: warm and dry. RUE AVF c/d/I with palpable thrill.  Neuro: AAOx3, no focal deficits noted.   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   Seizure 2/2 hypoglycemia versus R sided SDH Remains unclear if patient's seizure activity occurred as a result of hypoglycemia or from SDH. She will continue on keppra. Plan for neurosurgery follow up in the outpatient setting in 2-3 weeks for repeat CT head. She remains without any focal deficits on exam. Per the patient, she has a relatively decent appetite at home and thus hypoglycemia seems more related to underlying ESRD. Morning cortisol normal, so unlikely to be a cortisol deficiency. Unclear if feasible, but will  try to coordinate with RN to obtain a BMP, insulin, and c-peptide if she redevelops hypoglycemia (as long as not CBG not <50) in order to assess appropriate response.  -normothermia/normoglycemia  -continue keppra -seizure precautions -holding anticoagulation and antiplatelets until NSGY f/u -outpatient NSGY f/u in 2-3 weeks for repeat CTH -trending CBGs -RD consulted  Acute hypoxemic respiratory failure 2/2 acute on chronic HFrEF ESRD on HD T-Th-S possibly 2/2 lupus nephritis   Noted to have intermittent desaturations, currently on 2L [place]. Appears to tolerate room air well during my encounter. Will check ambulatory O2 sats with PT to assess need with mobility. Volume status managed via HD. -wean to room air as able, maintain O2 saturations >92% -HD per nephrology, appreciate assistance -ambulatory O2 sats with PT  HTN -continuing to hold home coreg given low-normal BP -will need to decide appropriate dosing prior to discharge  HFrEF (EF 35 to 40% on TTE this hospitalization) Unspecified Cardiomyopathy, likely NICM  Severe MR/AR/TVR Moderate mitral stenosis and mild aortic stenosis  Per cardiology, patient currently not candidate for intervention given her co-morbidities and substance use.  -continue counseling on cocaine cessation -TOC consult for substance use resources    [name] Axe, MD 12/05/2022, 9:22 AM After 5pm on weekdays and 1pm on weekends: On Call pager [phone]

## 2022-12-05 NOTE — Progress Notes (Signed)
OT Cancellation Note  Patient Details Name: [name] MRN: [id number] DOB: November 05, 1981   Cancelled Treatment:    Reason Eval/Treat Not Completed: Patient at procedure or test/ unavailable (at HD, will follow up for OT evaluation as appropriate.)  Renaye Rakers, OTD, OTR/L [facility]erred Acute Rehab [phone] - [place] 12/05/2022, 1:00 PM

## 2022-12-05 NOTE — Progress Notes (Signed)
[facility] Progress Note   Subjective:  Had BG this AM in 60s treated with po intake - she says she felt odd though it appears she reported to primary team she felt asymptomatic.  For HD today. Breathing back to baseline.    Objective Vitals:   12/04/22 1614 12/04/22 2005 12/05/22 0404 12/05/22 0845  BP: (!) 105/49 (!) 112/58 (!) 117/46 (!) 116/59  Pulse: 86 84 84 82  Resp: 17 18 17 19   Temp: 97.9 F (36.6 C) 97.9 F (36.6 C) 97.9 F (36.6 C) 97.7 F (36.5 C)  TempSrc: Oral Oral Oral Oral  SpO2: 98% 100% 100% 97%  Weight:   44.3 kg   Height:       Physical Exam General: sitting up in bed awake, comfortable Heart:RRR no rub Lungs: clear Abdomen: soft, thin Extremities: no edema Dialysis Access:  RUE AVF +t/b  Additional Objective Labs: Basic Metabolic Panel: Recent Labs  Lab 12/03/22 0951 12/04/22 0750 12/05/22 0029  NA 135 134* 136  K 3.9 3.8 4.6  CL 94* 93* 95*  CO2 28 29 27   GLUCOSE 112* 103* 86  BUN 70* 36* 62*  CREATININE 9.66* 6.13* 7.48*  CALCIUM 9.6 9.6 10.2  PHOS 7.2* 5.3* 6.3*    Liver Function Tests: Recent Labs  Lab 12/03/22 0951 12/04/22 0750 12/05/22 0029  ALBUMIN 2.3* 2.4* 2.3*    No results for input(s): "LIPASE", "AMYLASE" in the last 168 hours. CBC: Recent Labs  Lab 12/01/22 0330 12/01/22 1025 12/02/22 0606 12/03/22 0951 12/04/22 0750 12/05/22 0029  WBC 5.1  --  6.8 5.8 5.2 5.6  NEUTROABS  --   --   --  3.6  --   --   HGB 10.9*   < > 11.6* 10.4* 11.0* 11.4*  HCT 34.3*   < > 35.4* 33.1* 35.1* 35.0*  MCV 92.2  --  90.3 93.5 92.9 92.3  PLT 233  --  247 234 210 216   < > = values in this interval not displayed.    Blood Culture    Component Value Date/Time   SDES SYNOVIAL FLUID 04/14/2022 1440   SPECREQUEST LEFT SHOULDER 04/14/2022 1440   CULT  04/14/2022 1440    No growth aerobically or anaerobically. Performed at [facility], [place] [address] [place], [place] [address]    REPTSTATUS 04/19/2022  FINAL 04/14/2022 1440    Cardiac Enzymes: No results for input(s): "CKTOTAL", "CKMB", "CKMBINDEX", "TROPONINI" in the last 168 hours. CBG: Recent Labs  Lab 12/04/22 2003 12/05/22 0031 12/05/22 0401 12/05/22 0633 12/05/22 0742  GLUCAP 162* 88 66* 81 156*    Iron Studies:  Recent Labs    12/05/22 0029  IRON 35  TIBC 209*  FERRITIN 360*    @lablastinr3 @ Studies/Results: No results found. Medications:    Chlorhexidine Gluconate Cloth  6 each Topical Daily   docusate sodium  100 mg Oral BID   feeding supplement (NEPRO CARB STEADY)  237 mL Oral TID WC   influenza vac split quadrivalent PF  0.5 mL Intramuscular Tomorrow-1000   levETIRAcetam  500 mg Oral BID   multivitamin  1 tablet Oral QHS   nicotine  14 mg Transdermal Daily   polyethylene glycol  17 g Oral Daily   sevelamer carbonate  3,200 mg Oral TID WC    Dialysis Orders:  TTS at [facility] 3:45hr, 400/A1.5, EDW 44kg, UFP #4, AVF, no heparin - Hectoral 69mcg IV q HD (just lowered from 72mcg). - Insurance will not approve  Sensipar - no ESA - Last Phos 10.8 - supposed to be taking Renvela 4/meals   Assessment/Plan:  Acute respiratory failure/HF exacerbation: CXR with pulm edema, likely lost weight and needs lower EDW.  Achieving neg fluid balance with dialysis and now looks euvolemic and improved.  CHF: Cardiology consulted, TTE showing EF 35-40% -- thought to be valvular in origin but not a candidate for surgical repair.  Vol mgmt with dialysis.   ESRD:  TTS - had 12/5, next today.     Hypertension/volume: BP controlled, UF with HD tomorrow 2-3L - standing post wt and new EDW at d/c - appears euvolemic now.  Na/fluid restrict.   Anemia: Hgb 11.2 - no ESA yet.  Metabolic bone disease: Ca ok, Phos 10.7. Restarted home binders - suspect nonadherent. 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  Neurosurg following = plan repeat imaging 2-3wks, on keppra.  Hypoglycemia - at time of seizure - was on dextrose IV but BG has been ok now.   She is not diabetic.  She is having nutrition supplemented between meals.  Further w/u per primary - no h/o liver disease.   Dispo: of for d/c from my perspective - will have new outpt EDW at discharge.   Jannifer Hick MD 12/05/2022, 9:48 AM  [facility] Pager: [phone]

## 2022-12-05 NOTE — Progress Notes (Signed)
PT Cancellation Note  Patient Details Name: KHAMRYN CALDERONE MRN: [id number] DOB: [birth year]   Cancelled Treatment:    Reason Eval/Treat Not Completed: Patient at procedure or test/unavailable. Pt at HD treatment. Will plan to follow-up later as time permits.   Moishe Spice, PT, DPT Acute Rehabilitation Services  Office: [place] 12/05/2022, 12:55 PM

## 2022-12-05 NOTE — Progress Notes (Signed)
CBG 66 this AM. Pt alert and responsive. Requested apple juice x 1 and ordered [employer] via door dash. Repeat CBG 81. Continues with no acute distress.

## 2022-12-06 DIAGNOSIS — Z992 Dependence on renal dialysis: Secondary | ICD-10-CM

## 2022-12-06 DIAGNOSIS — I428 Other cardiomyopathies: Secondary | ICD-10-CM

## 2022-12-06 DIAGNOSIS — I351 Nonrheumatic aortic (valve) insufficiency: Secondary | ICD-10-CM

## 2022-12-06 DIAGNOSIS — I509 Heart failure, unspecified: Secondary | ICD-10-CM

## 2022-12-06 LAB — GLUCOSE, CAPILLARY
Glucose-Capillary: 83 mg/dL (ref 70–99)
Glucose-Capillary: 83 mg/dL (ref 70–99)
Glucose-Capillary: 85 mg/dL (ref 70–99)
Glucose-Capillary: 88 mg/dL (ref 70–99)
Glucose-Capillary: 92 mg/dL (ref 70–99)

## 2022-12-06 LAB — RENAL FUNCTION PANEL
Albumin: 2.4 g/dL — ABNORMAL LOW (ref 3.5–5.0)
Anion gap: 13 (ref 5–15)
BUN: 39 mg/dL — ABNORMAL HIGH (ref 6–20)
CO2: 27 mmol/L (ref 22–32)
Calcium: 9.6 mg/dL (ref 8.9–10.3)
Chloride: 96 mmol/L — ABNORMAL LOW (ref 98–111)
Creatinine, Ser: 4.5 mg/dL — ABNORMAL HIGH (ref 0.44–1.00)
GFR, Estimated: 12 mL/min — ABNORMAL LOW (ref >60)
Glucose, Bld: 76 mg/dL (ref 70–99)
Phosphorus: 4.6 mg/dL (ref 2.5–4.6)
Potassium: 4.5 mmol/L (ref 3.5–5.1)
Sodium: 136 mmol/L (ref 135–145)

## 2022-12-06 MED ORDER — ALBUTEROL SULFATE (2.5 MG/3ML) 0.083% IN NEBU: 2.5000 mg | INHALATION_SOLUTION | RESPIRATORY_TRACT | 12 refills | Status: DC | PRN

## 2022-12-06 MED ORDER — BLOOD GLUCOSE MONITOR KIT: PACK | 0 refills | Status: DC

## 2022-12-06 MED ORDER — LEVETIRACETAM 500 MG PO TABS: 500.0000 mg | ORAL_TABLET | Freq: Two times a day (BID) | ORAL | 0 refills | Status: DC

## 2022-12-06 NOTE — Discharge Instructions (Addendum)
You presented to the hospital with shortness of breath which was due to too much fluid. You received dialysis in the hospital and your breathing is much better. You also had seizures which could be due to a small bleed around your brain or the low sugars. It is hard to know which caused your seizures as both can cause it.   The neurosurgeon wants to follow up with you 2-3 weeks after discharge so please make an appointment with them. The number is attached. Also schedule a follow up with your PCP as well. Your sugars were running low and I will send a glucose meter and testing supplies to your pharmacy.   If your sugar gets low <70 and does not come back up with eating, come back to the emergency department.   Please continue taking your seizure medication as well.

## 2022-12-06 NOTE — Progress Notes (Signed)
OT Cancellation Note  Patient Details Name: [name] MRN: [id number] DOB: [birth year]   Cancelled Treatment:    Reason Eval/Treat Not Completed: OT screened, no needs identified, will sign off (per discussion with PT, pt ind with ADLs and mobility. No acute OT needs at this time, OT will s/o. Please reconsult if there is a change in pt status.)  Renaye Rakers, OTD, OTR/L [employer] Preferred Acute Rehab [phone]   Ulla Gallo 12/06/2022, 10:51 AM

## 2022-12-06 NOTE — Discharge Summary (Signed)
Name: [name] MRN: [id number] DOB: [birth year] 41 y.o. PCP: Camillia Herter, NP  Date of Admission: 11/30/2022  7:54 AM Date of Discharge: 12/06/2022  3:48 PM Attending Physician: Joni Reining DO  Discharge Diagnosis: Principal Problem:   Acute on chronic HFrEF (heart failure with reduced ejection fraction) ([place]) Active Problems:   ESRD on dialysis ([place])   Nonrheumatic aortic valve insufficiency   Nonrheumatic mitral valve regurgitation   Non-ischemic cardiomyopathy (HCC)   Seizures (HCC)   R Subdural hematoma (HCC)   Acute on chronic heart failure ([place]) Hypertension HFrEF Acute Hypoxic Respiratory Failure  Discharge Medications: Allergies as of 12/06/2022       Reactions   Cephalosporins Rash   To both keflex and cefazolin   Tobramycin Sulfate Swelling   Eye swelling   Vancomycin Swelling        Medication List     TAKE these medications    acetaminophen 650 MG CR tablet Commonly known as: TYLENOL Take 1,300 mg by mouth every 8 (eight) hours as needed for pain.   albuterol (2.5 MG/3ML) 0.083% nebulizer solution Commonly known as: PROVENTIL Take 3 mLs (2.5 mg total) by nebulization every 2 (two) hours as needed for wheezing or shortness of breath.   blood glucose meter kit and supplies Kit Dispense based on patient and insurance preference. Use up to four times daily as directed.   carvedilol 12.5 MG tablet Commonly known as: COREG Take 12.5 mg by mouth daily.   cinacalcet 30 MG tablet Commonly known as: SENSIPAR Take 30 mg by mouth every evening.   diphenhydrAMINE 25 MG tablet Commonly known as: BENADRYL Take 1 tablet (25 mg total) by mouth every 4 (four) hours as needed for itching or allergies. What changed: when to take this   doxercalciferol 4 MCG/2ML injection Commonly known as: HECTOROL Use as recommended by nephrology What changed:  how much to take when to take this   levETIRAcetam 500 MG tablet Commonly known as:  KEPPRA Take 1 tablet (500 mg total) by mouth 2 (two) times daily.   loperamide 2 MG tablet Commonly known as: IMODIUM A-D Take 4 mg by mouth daily as needed for diarrhea or loose stools. Dialysis days   multivitamin Tabs tablet Take 1 tablet by mouth daily.   NovaSource Renal Liqd Take 237 mLs by mouth See admin instructions. Take 1 container (237 mls) by at bedtime on dialysis days (Tuesday, Thursday, Saturday)   ondansetron 4 MG disintegrating tablet Commonly known as: ZOFRAN-ODT Take 1 tablet (4 mg total) by mouth every 8 (eight) hours as needed for nausea or vomiting.   pantoprazole 40 MG tablet Commonly known as: PROTONIX Take 1 tablet (40 mg total) by mouth 2 (two) times daily before a meal.   sevelamer carbonate 800 MG tablet Commonly known as: RENVELA Take 2,400 mg by mouth 3 (three) times daily.        Disposition and follow-up:   [name] was discharged from [facility] in Stable condition.  At the hospital follow up visit please address:  Hypoglycemia: Ensure this has resolved. Review pts glucometer which was given at discharge.  ESRD: Ensure pt receiving HD.  SLE: Ensure pt has rheumatology follow up.   2.  Labs / imaging needed at time of follow-up: BMP  3.  Pending labs/ test needing follow-up: None  Follow-up Appointments:  Follow-up Information     Consuella Lose, MD Follow up in 3 week(s).   Specialty: Neurosurgery Contact information:  [address] [place] [address] [phone]         Camillia Herter, NP. Call in 1 day(s).   Specialty: Nurse Practitioner Why: Call to make a hospital follow up. Contact information: [place] [address] [phone]                 Hospital Course by problem list: HPI per Dr. 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  She presents with progressively worsening shortness of breath over the last 2 to 3 weeks.      Patient states that at her baseline she is able to walk up a flight of stairs with minimal shortness of breath.  She is able to bathe and close her self without any dyspnea as well as working job as a Geophysicist/field seismologist.  However, over the past 2 to 3 weeks she states that she has become more dyspneic and fatigued.  She has become short of breath with bathing and getting dressed.  She has to stop multiple times to catch her breath when ambulating and she sometimes gets dizzy.  She also notes paroxysmal nocturnal dyspnea about 3-4 times a night and she chronically sleeps with multiple pillows.  She also states that she has had a decreased appetite which she only noticed about 3 days prior to admission and she is sleeping more than usual and has lost interest and doing some the activities she normally does.    Of note, patient states that she had a "head cold" about 2 weeks ago for which she took a COVID test that was initially positive however repeat test was negative.  She had no cough, fever, or sneezing.    Patient is anuric but has not missed an HD session recently, with her last session about 2 days prior to admission. She does state that she regularly stops her HD sessions short of completion as she is usually uncomfortable.  She most recently was paused them because she was experiencing a headache with hemodialysis.  She also notes that between her hemodialysis sessions she typically requires oxygen at her HD sessions because of the fluid buildup. Patient denies chest pain."  Seizure 2/2 hypoglycemia versus R sided SDH Pt was here for volume overload 2/2 to incomplete HD sessions but her hospitalization was complicated seizures secondary to hypoglycemia. Unclear if patient's low blood sugars  caused her seizure or if after becoming hypoglycemic she fell and her seizure was precipitated by her SDH. She had a brief stay in the ICU due to her seizures requiring close monitoring. She was intubated for imaging which noted a subdural hematoma. Patient was started on keppra and required D10 infusion to maintain her blood glucoses which has since been discontinued. NSGY evaluated patient regarding her R SDH and recommended she have close follow up in the outpatient setting with repeat imaging in 2-3 weeks for repeat imaging. . Patient was started on keppra and this was continued on discharge. Her hypoglycemia was monitored but she did not have recurrent episode. Given concern for hypoglycemia, we will discharge her with glucose monitoring kit. Antiplatelet and AC was held until NSG follow up.   Systemic Lupus Erythematosus  Not on any therapies. Pt was given referral to rheumatology but no record of follow up. Pt  will need to establish with rheumatology outpatient.    ESRD on HD T-Th-S possibly 2/2 lupus nephritis  HD was continued during her hospital stay.    Acute hypoxemic respiratory failure 2/2 acute on chronic HFrEF Had some desaturation 2/2 to volume overload that required nasal cannula. Pt weaned to room air before discharge and was ambulated without any desaturations.   HTN On coreg 12.5 mg daily at home only. This was held due to concern for low BP but restarted on discharge.    HFrEF (EF 35 to 40% on TTE this hospitalization), cardiomyopathy unspecified likely NICM  Severe MR/AR/TVR Moderate mitral stenosis and mild aortic stenosis  Per cardiology, patient currently not candidate for intervention given her co morbidities and substance use.   #Polysubstance Use Pt educated on cocaine cessation.   Discharge Subjective: States doing well. No acute concerns except concern for low sugars. She stated she wanted to go home.   Discharge Exam:   BP (!) 118/53 (BP Location: Left Arm)    Pulse 93   Temp (!) 97.5 F (36.4 C) (Oral)   Resp 17   Ht _0  (1.626 m)   Wt 42.4 kg   LMP  (Approximate)   SpO2 90%   BMI 16.04 kg/m  Physical Exam:  General: No acute concerns HENT: NCAT Cardiac: NSR, continuous murmur, No LE edema Resp: CTAB Abd: No TTP Psych: normal mood and normal affect  Pertinent Labs, Studies, and Procedures:  CT HEAD WO CONTRAST (5MM)  Addendum Date: 12/01/2022   ADDENDUM REPORT: 12/01/2022 11:30 ADDENDUM: These results were called by telephone at the time of interpretation on 12/01/2022 at 11:09 am to provider CHI ELLISON , who verbally acknowledged these results. Electronically Signed   By: Kellie Simmering D.O.   On: 12/01/2022 11:30   Result Date: 12/01/2022 IMPRESSION: Subdural collection overlying the right cerebral hemisphere, measuring up to 19 mm in thickness. 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 Mass effect upon the underlying right cerebral hemisphere with 2 mm leftward midline shift. Prominent calcifications within the bilateral corona radiata, basal ganglia and cerebellar white matter, progressed from the prior head CT of 01/01/2018. A history of end-stage renal disease is noted in the patient's medical chart, and these findings may be related to secondary hyperparathyroidism. Age advanced parenchymal atrophy. Bilateral proptosis. Electronically Signed: By: Kellie Simmering D.O. On: 12/01/2022 10:52   DG CHEST PORT 1 VIEW  Result Date: 12/01/2022 IMPRESSION: 1. Hazy bilateral perihilar opacification suggesting interstitial edema and less likely infection. Persistent left basilar opacification likely atelectasis/effusion. 2. Tubes and lines as described. Electronically Signed   By: Marin Olp M.D.   On: 12/01/2022 09:35   DG Chest Port 1 View  Result Date: 12/01/2022 CLINICAL DATA:  Status post CPR. EXAM: PORTABLE CHEST 1 VIEW COMPARISON:  11/30/2022 FINDINGS: The cardio pericardial  silhouette is enlarged. Diffuse bilateral airspace disease suggests edema. No evidence for pneumothorax. Telemetry leads into fibrillator pads overlie the chest. IMPRESSION: Diffuse bilateral airspace disease suggests edema. Electronically Signed   By: Misty Stanley M.D.   On: 12/01/2022 08:05   ECHOCARDIOGRAM COMPLETE  Result Date: 11/30/2022 IMPRESSIONS  1. Apical window is foreshortened. . Left ventricular ejection fraction, by estimation, is 35 to 40%. The left ventricle has moderately decreased function. The left ventricular internal cavity size was moderately dilated. There is mild left ventricular hypertrophy. Left ventricular diastolic parameters are indeterminate.  2. Right ventricular systolic function is moderately reduced.  The right ventricular size is normal. There is moderately elevated pulmonary artery systolic pressure.  3. Left atrial size was severely dilated.  4. Right atrial size was mildly dilated.  5. Moderate mitral stenosis with thickened / restricted valve and subvalvlular apparatus Mean gradient through the valve is 6 mm Hg (HR 100 bpm), relatively unchanged from previous echo. MR is severe. The mitral valve is rheumatic. Severe mitral valve regurgitation. Moderate mitral stenosis. Severe mitral annular calcification.  6. Tricuspid valve regurgitation is moderate to severe.  7. AV is thickened, calcified with restricted motion. Peak and mean gradients hrough the valve are 22 and 12 mm Hg respectively. AVA(VTI)1.72 cm2. DVI is 0. 53 consistent with mild aortic stenosis. .Aortic insuffiiency is very eccentric, severe... The aortic valve is tricuspid. Aortic valve regurgitation is severe.  8. The inferior vena cava is dilated in size with <50% respiratory variability, suggesting right atrial pressure of 15 mmHg.  DG Chest 2 View  Result Date: 11/30/2022 CLINICAL DATA:  Shortness of breath EXAM: CHEST - 2 VIEW COMPARISON:  04/12/2022 FINDINGS: Enlargement of cardiac silhouette with  pulmonary vascular congestion. Atherosclerotic calcification aorta. Diffuse infiltrates bilaterally likely pulmonary edema increased versus previous exam. Minimal bibasilar atelectasis and tiny pleural effusions. BILATERAL nipple shadows. Fluid versus atelectasis at minor fissure. No pneumothorax or acute osseous findings. IMPRESSION: CHF. Aortic Atherosclerosis (ICD10-I70.0). Electronically Signed   By: Lavonia Dana M.D.   On: 11/30/2022 09:01     Discharge Instructions: Discharge Instructions     Call MD for:  difficulty breathing, headache or visual disturbances   Complete by: As directed    Call MD for:  extreme fatigue   Complete by: As directed    Call MD for:  hives   Complete by: As directed    Call MD for:  persistant dizziness or light-headedness   Complete by: As directed    Call MD for:  persistant nausea and vomiting   Complete by: As directed    Call MD for:  redness, tenderness, or signs of infection (pain, swelling, redness, odor or green/yellow discharge around incision site)   Complete by: As directed    Call MD for:  severe uncontrolled pain   Complete by: As directed    Call MD for:  temperature >100.4   Complete by: As directed    Diet - low sodium heart healthy   Complete by: As directed    Discharge instructions   Complete by: As directed    You presented to the hospital with shortness of breath which was due to too much fluid. You received dialysis in the hospital and your breathing is much better. You also had seizures which could be due to a small bleed around your brain or the low sugars. It is hard to know which caused your seizures as both can cause it.   The neurosurgeon wants to follow up with you 2-3 weeks after discharge so please make an appointment with them. The number is attached. Also schedule a follow up with your PCP as well. Your sugars were running low and I will send a glucose meter and testing supplies to your pharmacy.   If your sugar gets low  <70 and does not come back up with eating, come back to the emergency department.   Please continue taking your seizure medication as well.   Increase activity slowly   Complete by: As directed        Signed: Idamae Schuller, MD Tillie Rung. [facility]  Internal Medicine Residency, PGY-2 12/06/2022, 4:19 PM   Pager:[phone]

## 2022-12-06 NOTE — Progress Notes (Signed)
[facility] Progress Note   Subjective:  No new issues.  Tol HD without issue yesterday - UF 2.5L post wt 42.4kg, no dyspnea.  Appears no further hypoglycemia but she's worried for home.  Plans to d/w primary team if she can have glucometer.     Objective Vitals:   12/05/22 1642 12/05/22 2005 12/06/22 0742 12/06/22 1107  BP: 97/70 (!) 107/55 (!) 106/56 (!) 118/53  Pulse: 99 90 93   Resp: 19 20 18 17   Temp:  98 F (36.7 C) (!) 97.5 F (36.4 C)   TempSrc:  Oral Oral   SpO2: 97% 96% 90%   Weight: 42.4 kg     Height:       Physical Exam General: sitting up in bed awake, comfortable Heart:RRR no rub Lungs: clear Abdomen: soft, thin Extremities: no edema Dialysis Access:  RUE AVF +t/b  Additional Objective Labs: Basic Metabolic Panel: Recent Labs  Lab 12/04/22 0750 12/05/22 0029 12/06/22 0014  NA 134* 136 136  K 3.8 4.6 4.5  CL 93* 95* 96*  CO2 29 27 27   GLUCOSE 103* 86 76  BUN 36* 62* 39*  CREATININE 6.13* 7.48* 4.50*  CALCIUM 9.6 10.2 9.6  PHOS 5.3* 6.3* 4.6    Liver Function Tests: Recent Labs  Lab 12/04/22 0750 12/05/22 0029 12/06/22 0014  ALBUMIN 2.4* 2.3* 2.4*    No results for input(s): "LIPASE", "AMYLASE" in the last 168 hours. CBC: Recent Labs  Lab 12/01/22 0330 12/01/22 1025 12/02/22 0606 12/03/22 0951 12/04/22 0750 12/05/22 0029  WBC 5.1  --  6.8 5.8 5.2 5.6  NEUTROABS  --   --   --  3.6  --   --   HGB 10.9*   < > 11.6* 10.4* 11.0* 11.4*  HCT 34.3*   < > 35.4* 33.1* 35.1* 35.0*  MCV 92.2  --  90.3 93.5 92.9 92.3  PLT 233  --  247 234 210 216   < > = values in this interval not displayed.    Blood Culture    Component Value Date/Time   SDES SYNOVIAL FLUID 04/14/2022 1440   SPECREQUEST LEFT SHOULDER 04/14/2022 1440   CULT  04/14/2022 1440    No growth aerobically or anaerobically. Performed at [facility], [place] [address] [place], [place] [address]    REPTSTATUS 04/19/2022 FINAL 04/14/2022 1440    Cardiac  Enzymes: No results for input(s): "CKTOTAL", "CKMB", "CKMBINDEX", "TROPONINI" in the last 168 hours. CBG: Recent Labs  Lab 12/06/22 0013 12/06/22 0158 12/06/22 0258 12/06/22 0812 12/06/22 1105  GLUCAP 83 88 85 83 92    Iron Studies:  Recent Labs    12/05/22 0029  IRON 35  TIBC 209*  FERRITIN 360*    @lablastinr3 @ Studies/Results: No results found. Medications:    Chlorhexidine Gluconate Cloth  6 each Topical Daily   docusate sodium  100 mg Oral BID   feeding supplement (NEPRO CARB STEADY)  237 mL Oral TID WC   influenza vac split quadrivalent PF  0.5 mL Intramuscular Tomorrow-1000   levETIRAcetam  500 mg Oral BID   melatonin  3 mg Oral QHS   multivitamin  1 tablet Oral QHS   nicotine  14 mg Transdermal Daily   polyethylene glycol  17 g Oral Daily   sevelamer carbonate  3,200 mg Oral TID WC    Dialysis Orders:  TTS at [facility] 3:45hr, 400/A1.5, EDW 44kg, UFP #4, AVF, no heparin - Hectoral 28mcg IV q HD (just lowered from  46mcg). - Insurance will not approve Sensipar - no ESA - Last Phos 10.8 - supposed to be taking Renvela 4/meals   Assessment/Plan:  Acute respiratory failure/HF exacerbation: CXR with pulm edema, likely lost weight and needs lower EDW.  Achieving neg fluid balance with dialysis and now looks euvolemic and improved.  CHF: Cardiology consulted, TTE showing EF 35-40% -- thought to be valvular in origin but not a candidate for surgical repair.  Vol mgmt with dialysis.   ESRD:  TTS - had 12/5, next Tues if remains in.     Hypertension/volume: BP controlled, UF with HD tomorrow 2-3L - standing post wt and new EDW at d/c - appears euvolemic now.  Na/fluid restrict.   Anemia: Hgb 11.2 - no ESA yet.  Metabolic bone disease: Ca ok, Phos 10.7. Restarted home binders - suspect nonadherent. 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  Neurosurg following = plan repeat imaging 2-3wks, on keppra.  Hypoglycemia - at time of seizure - was on dextrose IV but BG has been ok now.   She is not diabetic.  She is having nutrition supplemented between meals.  Further w/u per primary - no h/o liver disease.   Dispo: of for d/c from my perspective - will have new outpt EDW at discharge - 42.5kg for now.  Jannifer Hick MD 12/06/2022, 1:28 PM  [facility] Pager: [phone]

## 2022-12-06 NOTE — Evaluation (Signed)
Physical Therapy Evaluation & Discharge Patient Details Name: [name] MRN: [id number] DOB: July 24, 1981 Today's Date: 12/06/2022  History of Present Illness  Pt is a 41 y.o. female who presented 11/30/22 with progressively worsening shortness of breath x2-3 weeks. Admitted for acute respiratory failure and HF exacerbation. Code blue called 12/5 due to pt being unresponsive and seizing, appeared to be secondary to hypoglycemia vs chronic R sided SDH. Intubated and extubated 12/5. PMH: anemia, arthritis, CHF, ESRD, heart murmur, lupus, polysubstance abuse, seizures, chronic R SDH   Clinical Impression  Pt presents with condition above. PTA, she was independent without DME, living in a 2nd floor apartment with her mother. She is currently functioning close to her baseline, not needing any physical assistance or displaying any LOB with all functional mobility. She is currently mobilizing slower than her reported usual, but she reports not mobilizing much since admission x6 days ago. Pt will likely progress well with just increased mobility frequency and does not require acute or follow-up PT services. Educated pt on ways to improve her diet, but pt reporting she will likely not comply. She reports not mobilizing much at home either. Encouraged pt to progress mobility. All education completed and questions answered. PT will sign off.     Recommendations for follow up therapy are one component of a multi-disciplinary discharge planning process, led by the attending physician.  Recommendations may be updated based on patient status, additional functional criteria and insurance authorization.  Follow Up Recommendations No PT follow up      Assistance Recommended at Discharge PRN  Patient can return home with the following  Assist for transportation    Equipment Recommendations None recommended by PT  Recommendations for Other Services       Functional Status Assessment Patient has not had a  recent decline in their functional status     Precautions / Restrictions Precautions Precautions: Other (comment) Precaution Comments: seizures Restrictions Weight Bearing Restrictions: No      Mobility  Bed Mobility Overal bed mobility: Modified Independent             General bed mobility comments: Pt able to perform all bed mobility aspects without assistance, HOB elevated    Transfers Overall transfer level: Independent Equipment used: None               General transfer comment: Able to come to stand without assistance or LOB    Ambulation/Gait Ambulation/Gait assistance: Modified independent (Device/Increase time) Gait Distance (Feet): 340 Feet Assistive device: None Gait Pattern/deviations: Step-through pattern, Decreased stride length Gait velocity: reduced Gait velocity interpretation: 1.31 - 2.62 ft/sec, indicative of limited community ambulator   General Gait Details: Pt with slow but steady gait without LOB or need for support.  Stairs Stairs: Yes Stairs assistance: Supervision Stair Management: One rail Left, Alternating pattern, Step to pattern, Forwards Number of Stairs: 10 General stair comments: Ascends with reciprocal pattern and descends with reciprocal and intermittent step-to pattern. No LOB, supervision for safety  Wheelchair Mobility    Modified Rankin (Stroke Patients Only)       Balance Overall balance assessment: No apparent balance deficits (not formally assessed)                                           Pertinent Vitals/Pain Pain Assessment Pain Assessment: Faces Faces Pain Scale: Hurts a little bit Pain Location: headache  Pain Descriptors / Indicators: Headache Pain Intervention(s): Limited activity within patient's tolerance, Monitored during session    Home Living Family/patient expects to be discharged to:: Private residence Living Arrangements: Parent (mother) Available Help at Discharge:  Family;Available 24 hours/day Type of Home: Apartment Home Access: Stairs to enter Entrance Stairs-Rails: Can reach both Entrance Stairs-Number of Steps: 14   Home Layout: One level Home Equipment: Crutches      Prior Function Prior Level of Function : Independent/Modified Independent             Mobility Comments: No AD ADLs Comments: Does not drive much if at all, normally mother drives her. 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, newspaper        Extremity/Trunk Assessment   Upper Extremity Assessment Upper Extremity Assessment: Overall WFL for tasks assessed    Lower Extremity Assessment Lower Extremity Assessment: Overall WFL for tasks assessed    Cervical / Trunk Assessment Cervical / Trunk Assessment: Normal  Communication   Communication: No difficulties  Cognition Arousal/Alertness: Awake/alert Behavior During Therapy: WFL for tasks assessed/performed Overall Cognitive Status: Within Functional Limits for tasks assessed                                          General Comments General comments (skin integrity, edema, etc.): SBP 120s sitting end of session with pt reporting some mild lightheadedness with extended mobility; HR reading in 170s sitting end of session but palpated HR via carotid pulse and appeared to be much slower than that (likely in the 80s-90s but did not truly count), noted electrodes had difficulty sticking throughout session; RN notified of all; educated pt to improve her diet, weigh self daily, and increase frequency of mobility and progress mobility, she verbalized understanding but verbalized she would likely not comply to the diet    Exercises     Assessment/Plan    PT Assessment Patient does not need any further PT services  PT Problem List         PT Treatment Interventions      PT Goals (Current goals can be found in the Care Plan section)  Acute Rehab PT Goals Patient Stated Goal: to go home today PT Goal Formulation:  All assessment and education complete, DC therapy Time For Goal Achievement: 12/07/22 Potential to Achieve Goals: Good    Frequency       Co-evaluation               AM-PAC PT "6 Clicks" Mobility  Outcome Measure Help needed turning from your back to your side while in a flat bed without using bedrails?: None Help needed moving from lying on your back to sitting on the side of a flat bed without using bedrails?: None Help needed moving to and from a bed to a chair (including a wheelchair)?: None Help needed standing up from a chair using your arms (e.g., wheelchair or bedside chair)?: None Help needed to walk in hospital room?: None Help needed climbing 3-5 steps with a railing? : A Little 6 Click Score: 23    End of Session   Activity Tolerance: Patient tolerated treatment well Patient left: in chair;with call bell/phone within reach Nurse Communication: Mobility status;Other (comment) (vitals) PT Visit Diagnosis: Other abnormalities of gait and mobility (R26.89)    Time: 1638-4536 PT Time Calculation (min) (ACUTE ONLY): 19 min   Charges:  PT Evaluation $PT Eval Low Complexity: 1 Low          Moishe Spice, PT, DPT Acute Rehabilitation Services  Office: [phone]   Orvan Falconer 12/06/2022, 10:24 AM

## 2022-12-06 NOTE — TOC Transition Note (Signed)
Transition of Care [facility]) - CM/SW Discharge Note   Patient Details  Name: [name] MRN: [id number] Date of Birth: 30-Oct-1981  [facility]) CM/SW Contact:  Carles Collet, RN Phone Number: 12/06/2022, 2:39 PM   Clinical Narrative:     No HH or DME orders for DC. Patient Rxd nebulizer meds. Went to room to see if she had a working nebulizer at home, and she had already been discharged. Unable to reach her by phone.         Patient Goals and CMS Choice        Discharge Placement                       Discharge Plan and Services                                     Social Determinants of Health (SDOH) Interventions     Readmission Risk Interventions    04/17/2022    8:36 AM  Readmission Risk Prevention Plan  Transportation Screening Complete  PCP or Specialist Appt within 3-5 Days Complete  HRI or [place] Complete  Social Work Consult for [place] Planning/Counseling Complete  Palliative Care Screening Not Applicable  Medication Review [employer]) Complete

## 2022-12-07 ENCOUNTER — Telehealth: Payer: Self-pay

## 2022-12-07 NOTE — Telephone Encounter (Signed)
Transition Care Management Unsuccessful Follow-up Telephone Call  Date of discharge and from where:  [place] 12-06-22 Dx: Acute on chronic HF with reduced EF  Attempts:  1st Attempt   [place] Direct Dial [phone]

## 2022-12-12 ENCOUNTER — Other Ambulatory Visit: Payer: Self-pay | Admitting: Internal Medicine

## 2022-12-14 NOTE — Telephone Encounter (Signed)
Order complete. 

## 2023-01-07 ENCOUNTER — Other Ambulatory Visit: Payer: Self-pay | Admitting: Internal Medicine

## 2023-01-07 DIAGNOSIS — R569 Unspecified convulsions: Secondary | ICD-10-CM

## 2023-01-08 NOTE — Telephone Encounter (Signed)
Order complete. 

## 2023-01-15 MED FILL — Medication: Qty: 1 | Status: AC

## 2023-10-27 ENCOUNTER — Other Ambulatory Visit: Payer: Self-pay

## 2023-10-27 ENCOUNTER — Other Ambulatory Visit (HOSPITAL_COMMUNITY): Payer: Self-pay

## 2023-12-13 ENCOUNTER — Inpatient Hospital Stay (HOSPITAL_COMMUNITY)
Admission: EM | Admit: 2023-12-13 | Discharge: 2023-12-22 | DRG: 853 | Disposition: A | Discharge status: Skilled Nursing Facility | Payer: Medicaid Other | Attending: Infectious Diseases | Admitting: Infectious Diseases

## 2023-12-13 ENCOUNTER — Other Ambulatory Visit: Payer: Self-pay

## 2023-12-13 ENCOUNTER — Emergency Department (HOSPITAL_COMMUNITY): Payer: Medicaid Other

## 2023-12-13 ENCOUNTER — Observation Stay (HOSPITAL_COMMUNITY): Payer: Medicaid Other

## 2023-12-13 ENCOUNTER — Encounter (HOSPITAL_COMMUNITY): Payer: Self-pay | Admitting: Internal Medicine

## 2023-12-13 DIAGNOSIS — Z992 Dependence on renal dialysis: Secondary | ICD-10-CM

## 2023-12-13 DIAGNOSIS — R0682 Tachypnea, not elsewhere classified: Secondary | ICD-10-CM | POA: Diagnosis present

## 2023-12-13 DIAGNOSIS — I5022 Chronic systolic (congestive) heart failure: Secondary | ICD-10-CM | POA: Diagnosis present

## 2023-12-13 DIAGNOSIS — D631 Anemia in chronic kidney disease: Secondary | ICD-10-CM | POA: Diagnosis present

## 2023-12-13 DIAGNOSIS — R6520 Severe sepsis without septic shock: Secondary | ICD-10-CM

## 2023-12-13 DIAGNOSIS — N186 End stage renal disease: Secondary | ICD-10-CM

## 2023-12-13 DIAGNOSIS — R197 Diarrhea, unspecified: Secondary | ICD-10-CM | POA: Diagnosis present

## 2023-12-13 DIAGNOSIS — I4892 Unspecified atrial flutter: Secondary | ICD-10-CM | POA: Diagnosis not present

## 2023-12-13 DIAGNOSIS — G9341 Metabolic encephalopathy: Secondary | ICD-10-CM

## 2023-12-13 DIAGNOSIS — R7881 Bacteremia: Secondary | ICD-10-CM | POA: Diagnosis present

## 2023-12-13 DIAGNOSIS — I4891 Unspecified atrial fibrillation: Secondary | ICD-10-CM

## 2023-12-13 DIAGNOSIS — M329 Systemic lupus erythematosus, unspecified: Secondary | ICD-10-CM | POA: Diagnosis present

## 2023-12-13 DIAGNOSIS — I083 Combined rheumatic disorders of mitral, aortic and tricuspid valves: Secondary | ICD-10-CM | POA: Diagnosis present

## 2023-12-13 DIAGNOSIS — Z881 Allergy status to other antibiotic agents status: Secondary | ICD-10-CM

## 2023-12-13 DIAGNOSIS — I5023 Acute on chronic systolic (congestive) heart failure: Secondary | ICD-10-CM | POA: Diagnosis present

## 2023-12-13 DIAGNOSIS — R54 Age-related physical debility: Secondary | ICD-10-CM | POA: Diagnosis present

## 2023-12-13 DIAGNOSIS — R0602 Shortness of breath: Secondary | ICD-10-CM

## 2023-12-13 DIAGNOSIS — Z1611 Resistance to penicillins: Secondary | ICD-10-CM | POA: Diagnosis present

## 2023-12-13 DIAGNOSIS — A419 Sepsis, unspecified organism: Secondary | ICD-10-CM | POA: Diagnosis not present

## 2023-12-13 DIAGNOSIS — I871 Compression of vein: Secondary | ICD-10-CM | POA: Diagnosis present

## 2023-12-13 DIAGNOSIS — M3214 Glomerular disease in systemic lupus erythematosus: Secondary | ICD-10-CM | POA: Diagnosis present

## 2023-12-13 DIAGNOSIS — R1310 Dysphagia, unspecified: Secondary | ICD-10-CM | POA: Diagnosis present

## 2023-12-13 DIAGNOSIS — Z79899 Other long term (current) drug therapy: Secondary | ICD-10-CM

## 2023-12-13 DIAGNOSIS — R6521 Severe sepsis with septic shock: Secondary | ICD-10-CM | POA: Diagnosis present

## 2023-12-13 DIAGNOSIS — F1410 Cocaine abuse, uncomplicated: Secondary | ICD-10-CM | POA: Diagnosis present

## 2023-12-13 DIAGNOSIS — Z883 Allergy status to other anti-infective agents status: Secondary | ICD-10-CM

## 2023-12-13 DIAGNOSIS — S70321A Blister (nonthermal), right thigh, initial encounter: Secondary | ICD-10-CM | POA: Diagnosis present

## 2023-12-13 DIAGNOSIS — B370 Candidal stomatitis: Secondary | ICD-10-CM | POA: Diagnosis present

## 2023-12-13 DIAGNOSIS — Z515 Encounter for palliative care: Secondary | ICD-10-CM

## 2023-12-13 DIAGNOSIS — R188 Other ascites: Secondary | ICD-10-CM | POA: Diagnosis present

## 2023-12-13 DIAGNOSIS — A4159 Other Gram-negative sepsis: Principal | ICD-10-CM | POA: Diagnosis present

## 2023-12-13 DIAGNOSIS — E872 Acidosis, unspecified: Principal | ICD-10-CM

## 2023-12-13 DIAGNOSIS — R159 Full incontinence of feces: Secondary | ICD-10-CM | POA: Diagnosis present

## 2023-12-13 DIAGNOSIS — F17210 Nicotine dependence, cigarettes, uncomplicated: Secondary | ICD-10-CM | POA: Diagnosis present

## 2023-12-13 DIAGNOSIS — D696 Thrombocytopenia, unspecified: Secondary | ICD-10-CM

## 2023-12-13 DIAGNOSIS — R64 Cachexia: Secondary | ICD-10-CM | POA: Diagnosis present

## 2023-12-13 DIAGNOSIS — B961 Klebsiella pneumoniae [K. pneumoniae] as the cause of diseases classified elsewhere: Secondary | ICD-10-CM | POA: Diagnosis present

## 2023-12-13 HISTORY — DX: Sepsis, unspecified organism: A41.9

## 2023-12-13 LAB — RESP PANEL BY RT-PCR (RSV, FLU A&B, COVID)  RVPGX2
Influenza A by PCR: NEGATIVE
Influenza B by PCR: NEGATIVE
Resp Syncytial Virus by PCR: NEGATIVE
SARS Coronavirus 2 by RT PCR: NEGATIVE

## 2023-12-13 LAB — COMPREHENSIVE METABOLIC PANEL
ALT: 27 U/L (ref 0–44)
AST: 26 U/L (ref 15–41)
Albumin: 2.4 g/dL — ABNORMAL LOW (ref 3.5–5.0)
Alkaline Phosphatase: 199 U/L — ABNORMAL HIGH (ref 38–126)
Anion gap: 15 (ref 5–15)
BUN: 51 mg/dL — ABNORMAL HIGH (ref 6–20)
CO2: 26 mmol/L (ref 22–32)
Calcium: 10.2 mg/dL (ref 8.9–10.3)
Chloride: 91 mmol/L — ABNORMAL LOW (ref 98–111)
Creatinine, Ser: 6.55 mg/dL — ABNORMAL HIGH (ref 0.44–1.00)
GFR, Estimated: 8 mL/min — ABNORMAL LOW (ref >60)
Glucose, Bld: 103 mg/dL — ABNORMAL HIGH (ref 70–99)
Potassium: 3.9 mmol/L (ref 3.5–5.1)
Sodium: 132 mmol/L — ABNORMAL LOW (ref 135–145)
Total Bilirubin: 2.7 mg/dL — ABNORMAL HIGH (ref <1.2)
Total Protein: 7.6 g/dL (ref 6.5–8.1)

## 2023-12-13 LAB — BLOOD CULTURE ID PANEL (REFLEXED) - BCID2

## 2023-12-13 LAB — CBC WITH DIFFERENTIAL/PLATELET
Abs Immature Granulocytes: 0.09 10*3/uL — ABNORMAL HIGH (ref 0.00–0.07)
Basophils Absolute: 0 10*3/uL (ref 0.0–0.1)
Basophils Relative: 0 %
Eosinophils Absolute: 0 10*3/uL (ref 0.0–0.5)
Eosinophils Relative: 0 %
HCT: 41.2 % (ref 36.0–46.0)
Hemoglobin: 13.8 g/dL (ref 12.0–15.0)
Immature Granulocytes: 1 %
Lymphocytes Relative: 8 %
Lymphs Abs: 0.7 10*3/uL (ref 0.7–4.0)
MCH: 29.8 pg (ref 26.0–34.0)
MCHC: 33.5 g/dL (ref 30.0–36.0)
MCV: 89 fL (ref 80.0–100.0)
Monocytes Absolute: 0.4 10*3/uL (ref 0.1–1.0)
Monocytes Relative: 5 %
Neutro Abs: 7.1 10*3/uL (ref 1.7–7.7)
Neutrophils Relative %: 86 %
Platelets: 103 10*3/uL — ABNORMAL LOW (ref 150–400)
RBC: 4.63 MIL/uL (ref 3.87–5.11)
RDW: 18.6 % — ABNORMAL HIGH (ref 11.5–15.5)
WBC: 8.2 10*3/uL (ref 4.0–10.5)
nRBC: 0.4 % — ABNORMAL HIGH (ref 0.0–0.2)

## 2023-12-13 LAB — I-STAT VENOUS BLOOD GAS, ED
Acid-base deficit: 2 mmol/L (ref 0.0–2.0)
Bicarbonate: 24.2 mmol/L (ref 20.0–28.0)
Calcium, Ion: 1.14 mmol/L — ABNORMAL LOW (ref 1.15–1.40)
HCT: 41 % (ref 36.0–46.0)
Hemoglobin: 13.9 g/dL (ref 12.0–15.0)
O2 Saturation: 51 %
Potassium: 3.7 mmol/L (ref 3.5–5.1)
Sodium: 136 mmol/L (ref 135–145)
TCO2: 26 mmol/L (ref 22–32)
pCO2, Ven: 43.9 mm[Hg] — ABNORMAL LOW (ref 44–60)
pH, Ven: 7.35 (ref 7.25–7.43)
pO2, Ven: 29 mm[Hg] — CL (ref 32–45)

## 2023-12-13 LAB — APTT: aPTT: 35 s (ref 24–36)

## 2023-12-13 LAB — I-STAT CG4 LACTIC ACID, ED
Lactic Acid, Venous: 2.9 mmol/L (ref 0.5–1.9)
Lactic Acid, Venous: 5.4 mmol/L (ref 0.5–1.9)
Lactic Acid, Venous: 4.8 mmol/L (ref 0.5–1.9)
Lactic Acid, Venous: 3.7 mmol/L (ref 0.5–1.9)

## 2023-12-13 LAB — PROTIME-INR
INR: 1.5 — ABNORMAL HIGH (ref 0.8–1.2)
Prothrombin Time: 18.6 s — ABNORMAL HIGH (ref 11.4–15.2)

## 2023-12-13 LAB — HEPATITIS B SURFACE ANTIGEN: Hepatitis B Surface Ag: NONREACTIVE

## 2023-12-13 LAB — BRAIN NATRIURETIC PEPTIDE: B Natriuretic Peptide: >4500 pg/mL — ABNORMAL HIGH (ref 0.0–100.0)

## 2023-12-13 LAB — HCG, SERUM, QUALITATIVE: Preg, Serum: NEGATIVE

## 2023-12-13 MED ORDER — ACETAMINOPHEN 650 MG RE SUPP: 650.0000 mg | Freq: Four times a day (QID) | RECTAL | Status: DC | PRN

## 2023-12-13 MED ORDER — SODIUM CHLORIDE 0.9 % IV SOLN
500.0000 mg | Freq: Once | INTRAVENOUS | Status: DC
  Filled 2023-12-13: qty 5

## 2023-12-13 MED ORDER — HEPARIN SODIUM (PORCINE) 5000 UNIT/ML IJ SOLN
5000.0000 [IU] | Freq: Three times a day (TID) | INTRAMUSCULAR | Status: DC
  Administered 2023-12-13 – 2023-12-17 (×10): 5000 [IU] via SUBCUTANEOUS
  Filled 2023-12-13 (×10): qty 1

## 2023-12-13 MED ORDER — SODIUM CHLORIDE 0.9 % IV SOLN
2.0000 g | INTRAVENOUS | Status: DC
Start: 2023-12-14 — End: 2023-12-15
  Administered 2023-12-14 – 2023-12-15 (×2): 2 g via INTRAVENOUS
  Filled 2023-12-13 (×2): qty 20

## 2023-12-13 MED ORDER — SODIUM CHLORIDE 0.9 % IV SOLN
2.0000 g | Freq: Once | INTRAVENOUS | Status: AC
  Administered 2023-12-13: 2 g via INTRAVENOUS
  Filled 2023-12-13: qty 20

## 2023-12-13 MED ORDER — SODIUM CHLORIDE 0.9 % IV SOLN
500.0000 mg | Freq: Once | INTRAVENOUS | Status: AC
  Administered 2023-12-13: 500 mg via INTRAVENOUS
  Filled 2023-12-13: qty 5

## 2023-12-13 MED ORDER — SODIUM CHLORIDE 0.9 % IV SOLN: 2.0000 g | Freq: Once | INTRAVENOUS | Status: DC

## 2023-12-13 MED ORDER — CHLORHEXIDINE GLUCONATE CLOTH 2 % EX PADS
6.0000 | MEDICATED_PAD | Freq: Every day | CUTANEOUS | Status: DC
  Administered 2023-12-14 – 2023-12-17 (×4): 6 via TOPICAL

## 2023-12-13 MED ORDER — SODIUM CHLORIDE 0.9 % IV BOLUS (SEPSIS)
250.0000 mL | Freq: Once | INTRAVENOUS | Status: AC
Start: 2023-12-13 — End: 2023-12-13
  Administered 2023-12-13: 250 mL via INTRAVENOUS

## 2023-12-13 MED ORDER — SODIUM CHLORIDE 0.9 % IV SOLN: Freq: Once | INTRAVENOUS | Status: AC

## 2023-12-13 MED ORDER — SODIUM CHLORIDE 0.9 % IV BOLUS (SEPSIS)
1000.0000 mL | Freq: Once | INTRAVENOUS | Status: AC
Start: 2023-12-13 — End: 2023-12-13
  Administered 2023-12-13: 1000 mL via INTRAVENOUS

## 2023-12-13 MED ORDER — SODIUM CHLORIDE 0.9% FLUSH
10.0000 mL | Freq: Two times a day (BID) | INTRAVENOUS | Status: DC
  Administered 2023-12-14 – 2023-12-21 (×16): 10 mL via INTRAVENOUS

## 2023-12-13 MED ORDER — SODIUM CHLORIDE 0.9 % IV BOLUS (SEPSIS)
500.0000 mL | Freq: Once | INTRAVENOUS | Status: AC
  Administered 2023-12-13: 500 mL via INTRAVENOUS

## 2023-12-13 MED ORDER — ACETAMINOPHEN 325 MG PO TABS
650.0000 mg | ORAL_TABLET | Freq: Four times a day (QID) | ORAL | Status: DC | PRN
  Administered 2023-12-14: 650 mg via ORAL
  Filled 2023-12-13: qty 2

## 2023-12-13 MED ORDER — ACETAMINOPHEN 325 MG PO TABS
650.0000 mg | ORAL_TABLET | Freq: Once | ORAL | Status: AC
  Administered 2023-12-13: 650 mg via ORAL
  Filled 2023-12-13: qty 2

## 2023-12-13 NOTE — ED Notes (Signed)
EDP aware of BNP > 4,500- continue continuous infusion  per EDP.

## 2023-12-13 NOTE — ED Provider Notes (Signed)
Patient signed out to me at 0700 by Dr. Bebe Shaggy pending labs with plan for admission.  In short this is a 42 year old female with a past medical history of lupus, ESRD and CHF that presented to the emergency department with cough and shortness of breath.  Did report that she had her full dialysis session on Saturday.  She was afebrile on arrival here but was tachycardic and tachypneic, reportedly hypotensive in the field for EMS.  Patient had workup performed that shows an uptrending lactate from 2.9-5.  She has been started on antibiotics and gentle fluids.  Her viral panel is negative.  Chest x-ray showed pulmonary edema but no focal infiltrates.  Patient will require admission for further sepsis workup. Will add on VBG with tachypnea and increased lactic acidosis. Patient did have diarrhea in the ED. She is ill appearing on my evaluation, tachycardic, mild crackles at the bases. Abdomen soft and non-tender.   Rexford Maus, DO 12/13/23 [phone]

## 2023-12-13 NOTE — ED Notes (Signed)
ED TO INPATIENT HANDOFF REPORT  ED Nurse Name and Phone #: Theophilus Bones [phone]  S Name/Age/Gender Rolland Bimler 42 y.o. female Room/Bed: 041C/041C  Code Status   Code Status: Full Code  Home/SNF/Other Home Patient oriented to: self, place, time, and situation Is this baseline? Yes   Triage Complete: Triage complete  Chief Complaint Sepsis [facility]) [A41.9]  Triage Note Patient c/o ShOB and cough, states she gets out of breath when she is walking.   Allergies Allergies  Allergen Reactions   Cephalosporins Rash    To both keflex and cefazolin   Tobramycin Sulfate Swelling    Eye swelling   Vancomycin Swelling    Level of Care/Admitting Diagnosis ED Disposition     ED Disposition  Admit   Condition  --   Comment  Hospital [facility]: [facility] [[id number]]  Level of Care: Telemetry Medical [104]  May place patient in observation at [facility] or [facility] if equivalent level of care is available:: No  Covid Evaluation: Confirmed COVID Negative  Diagnosis: Sepsis [facility]) [[id number]]  Admitting Physician: Reymundo Poll [[id number]]  Attending Physician: Reymundo Poll [[id number]]          B Medical/Surgery History Past Medical History:  Diagnosis Date   Anemia    low iron - receives iron at dialysis   Anxiety    Arthritis    RA   Chronic systolic congestive heart failure (HCC) 03/16/2016   Dyspnea    ESRD (end stage renal disease) (HCC)    [facility] _ [place]   H/O pericarditis 01/17/2013   H/O pleural effusion 01/17/2013   Heart murmur    Lupus (systemic lupus erythematosus) (HCC)    Previously followed with Dr. 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 pneumonae bacteremia on 01/21/2013. Sensitive to Peniccilin    Past Surgical History:  Procedure Laterality Date   AV FISTULA PLACEMENT     AV FISTULA PLACEMENT Right 08/01/2020   Procedure: RIGHT ARM BRACHIOCEPHALIC  ARTERIOVENOUS (AV) FISTULA CREATION;  Surgeon: Larina Earthly, MD;  Location: [facility] OR;  Service: Vascular;  Laterality: Right;   BASCILIC VEIN TRANSPOSITION Left 02/05/2014   Procedure: BASCILIC VEIN TRANSPOSITION;  Surgeon: Larina Earthly, MD;  Location: [facility] OR;  Service: Vascular;  Laterality: Left;   BASCILIC VEIN TRANSPOSITION Right 01/08/2021   Procedure: RIGHT ARM SECOND STAGE BASCILIC VEIN TRANSPOSITION;  Surgeon: Larina Earthly, MD;  Location: [facility] OR;  Service: Vascular;  Laterality: Right;   BIOPSY  12/17/2021   Procedure: BIOPSY;  Surgeon: Lemar Lofty., MD;  Location: [facility] ENDOSCOPY;  Service: Gastroenterology;;   ESOPHAGOGASTRODUODENOSCOPY (EGD) WITH PROPOFOL N/A 12/17/2021   Procedure: ESOPHAGOGASTRODUODENOSCOPY (EGD) WITH PROPOFOL;  Surgeon: Lemar Lofty., MD;  Location: [facility] ENDOSCOPY;  Service: Gastroenterology;  Laterality: N/A;   FISTULA SUPERFICIALIZATION Left 05/30/2018   Procedure: FISTULA PLICATION BASILIC VEIN TRANSPOSITION;  Surgeon: Chuck Hint, MD;  Location: [facility] OR;  Service: Vascular;  Laterality: Left;   FISTULA SUPERFICIALIZATION Left 12/18/2019   Procedure: PLICATION OF  LEFT ARTERIOVENOUS FISTULA ULCER;  Surgeon: Larina Earthly, MD;  Location: [facility] OR;  Service: Vascular;  Laterality: Left;   I & D EXTREMITY Right 02/18/2021   Procedure: IRRIGATION AND DEBRIDEMENT OF ARM;  Surgeon: Chuck Hint, MD;  Location: [facility] OR;  Service: Vascular;  Laterality: Right;   INSERTION OF DIALYSIS CATHETER N/A 05/19/2020   Procedure: TUNNELED INSERTION  OF DIALYSIS CATHETER;   Surgeon: Maeola Harman, MD;  Location: [facility] OR;  Service: Vascular;  Laterality: N/A;   THROMBECTOMY AND REVISION OF ARTERIOVENTOUS (AV) GORETEX  GRAFT Left 07/28/2020   Procedure: Oversewing of left arm Brachial cephalic fistula for bleeding.;  Surgeon: Chuck Hint, MD;  Location: [facility] OR;  Service: Vascular;  Laterality: Left;   VENOGRAM Right 01/31/2014   Procedure: DIALYSIS CATHETER;  Surgeon: Nada Libman, MD;  Location: [facility] CATH LAB;  Service: Cardiovascular;  Laterality: Right;     A IV Location/Drains/Wounds Patient Lines/Drains/Airways Status     Active Line/Drains/Airways     Name Placement date Placement time Site Days   Peripheral IV 12/13/23 20 G Left;Posterior Hand 12/13/23  0850  Hand  less than 1   Fistula / Graft Right Upper arm 12/18/19  1900  Upper arm  1456   Fistula / Graft Right Upper arm Arteriovenous fistula 08/01/20  0835  Upper arm  1229   Incision (Closed) 12/18/19 Arm Left 12/18/19  1820  -- 1456   Incision (Closed) 05/19/20 Chest Right 05/19/20  0827  -- 1303   Incision (Closed) 05/19/20 Neck Right 05/19/20  0827  -- 1303   Incision (Closed) 07/28/20 Arm Left 07/28/20  1119  -- 1233   Incision (Closed) 08/01/20 Arm Right 08/01/20  0730  -- 1229   Incision (Closed) 01/08/21 Arm Right 01/08/21  1140  -- 1069   Incision (Closed) 02/18/21 Arm Right 02/18/21  1349  -- 1028            Intake/Output Last 24 hours  Intake/Output Summary (Last 24 hours) at 12/13/2023 1149 Last data filed at 12/13/2023 0702 Gross per 24 hour  Intake 250 ml  Output --  Net 250 ml    Labs/Imaging Results for orders placed or performed during the hospital encounter of 12/13/23 (from the past 48 hours)  Comprehensive metabolic panel     Status: Abnormal   Collection Time: 12/13/23  6:10 AM  Result Value Ref Range   Sodium 132 (L) 135 - 145 mmol/L   Potassium 3.9 3.5 - 5.1 mmol/L   Chloride 91 (L) 98 - 111 mmol/L   CO2 26 22 - 32 mmol/L   Glucose, Bld  103 (H) 70 - 99 mg/dL    Comment: Glucose reference range applies only to samples taken after fasting for at least 8 hours.   BUN 51 (H) 6 - 20 mg/dL   Creatinine, Ser 6.21 (H) 0.44 - 1.00 mg/dL   Calcium 30.8 8.9 - 65.7 mg/dL   Total Protein 7.6 6.5 - 8.1 g/dL   Albumin 2.4 (L) 3.5 - 5.0 g/dL   AST 26 15 - 41 U/L   ALT 27 0 - 44 U/L   Alkaline Phosphatase 199 (H) 38 - 126 U/L   Total Bilirubin 2.7 (H) <1.2 mg/dL   GFR, Estimated 8 (L) >60 mL/min    Comment: (NOTE) Calculated using the CKD-EPI Creatinine Equation (2021)    Anion gap 15 5 - 15    Comment: Performed at [facility] Lab, [address], [place],  [place] [id number]  CBC with Differential     Status: Abnormal   Collection Time: 12/13/23  6:10 AM  Result Value Ref Range   WBC 8.2 4.0 - 10.5 K/uL   RBC 4.63 3.87 - 5.11 MIL/uL   Hemoglobin 13.8 12.0 - 15.0 g/dL   HCT 91.4 78.2 - 95.6 %   MCV 89.0 80.0 - 100.0 fL   MCH 29.8 26.0 - 34.0 pg   MCHC 33.5 30.0 - 36.0 g/dL   RDW 21.3 (H) 08.6 - 57.8 %   Platelets 103 (L) 150 - 400 K/uL    Comment: REPEATED TO VERIFY   nRBC 0.4 (H) 0.0 - 0.2 %   Neutrophils Relative % 86 %   Neutro Abs 7.1 1.7 - 7.7 K/uL   Lymphocytes Relative 8 %   Lymphs Abs 0.7 0.7 - 4.0 K/uL   Monocytes Relative 5 %   Monocytes Absolute 0.4 0.1 - 1.0 K/uL   Eosinophils Relative 0 %   Eosinophils Absolute 0.0 0.0 - 0.5 K/uL   Basophils Relative 0 %   Basophils Absolute 0.0 0.0 - 0.1 K/uL   Immature Granulocytes 1 %   Abs Immature Granulocytes 0.09 (H) 0.00 - 0.07 K/uL    Comment: Performed at [facility], [address], [place], [place] [address]  Protime-INR     Status: Abnormal   Collection Time: 12/13/23  6:10 AM  Result Value Ref Range   Prothrombin Time 18.6 (H) 11.4 - 15.2 seconds   INR 1.5 (H) 0.8 - 1.2    Comment: (NOTE) INR goal varies based on device and disease states. Performed at [facility], [address], [place], [place] [address]   APTT     Status: None    Collection Time: 12/13/23  6:10 AM  Result Value Ref Range   aPTT 35 24 - 36 seconds    Comment: Performed at [facility], [address], [place], [place] [address]  hCG, serum, qualitative     Status: None   Collection Time: 12/13/23  6:10 AM  Result Value Ref Range   Preg, Serum NEGATIVE NEGATIVE    Comment:        THE SENSITIVITY OF THIS METHODOLOGY IS >10 mIU/mL. Performed at [facility], [address], [place], [place] [address]   Resp panel by RT-PCR (RSV, Flu A&B, Covid) Peripheral     Status: None   Collection Time: 12/13/23  6:10 AM   Specimen: Peripheral; Nasal Swab  Result Value Ref Range   SARS Coronavirus 2 by RT PCR NEGATIVE NEGATIVE   Influenza A by PCR NEGATIVE NEGATIVE   Influenza B by PCR NEGATIVE NEGATIVE    Comment: (NOTE) The Xpert Xpress SARS-CoV-2/FLU/RSV plus assay is intended as an aid in the diagnosis of influenza from Nasopharyngeal swab specimens and should not be used as a sole basis for treatment. Nasal washings and aspirates are unacceptable for Xpert Xpress SARS-CoV-2/FLU/RSV testing.  Fact Sheet for Patients: BloggerCourse.com  Fact Sheet for Healthcare Providers: SeriousBroker.it  This test is not yet approved or cleared by the [place] FDA and has been authorized for detection and/or diagnosis of SARS-CoV-2 by FDA under an Emergency Use Authorization (EUA). This EUA will remain in effect (meaning this test can be used) for the duration of the COVID-19 declaration under Section 564(b)(1) of the Act, 21 U.S.C. section 360bbb-3(b)(1), unless the authorization is terminated or revoked.     Resp Syncytial Virus by PCR NEGATIVE  NEGATIVE    Comment: (NOTE) Fact Sheet for Patients: BloggerCourse.com  Fact Sheet for Healthcare Providers: SeriousBroker.it  This test is not yet approved or cleared by the [place] FDA  and has been authorized for detection and/or diagnosis of SARS-CoV-2 by FDA under an Emergency Use Authorization (EUA). This EUA will remain in effect (meaning this test can be used) for the duration of the COVID-19 declaration under Section 564(b)(1) of the Act, 21 U.S.C. section 360bbb-3(b)(1), unless the authorization is terminated or revoked.  Performed at [facility] Lab, [address] [place], [place] [address]   Brain natriuretic peptide     Status: Abnormal   Collection Time: 12/13/23  6:10 AM  Result Value Ref Range   B Natriuretic Peptide >4,500.0 (H) 0.0 - 100.0 pg/mL    Comment: Performed at [facility] Lab, [address] [place], [place] [address]  I-Stat Lactic Acid, ED     Status: Abnormal   Collection Time: 12/13/23  6:30 AM  Result Value Ref Range   Lactic Acid, Venous 2.9 (HH) 0.5 - 1.9 mmol/L   Comment NOTIFIED PHYSICIAN   I-Stat Lactic Acid, ED     Status: Abnormal   Collection Time: 12/13/23  7:42 AM  Result Value Ref Range   Lactic Acid, Venous 5.4 (HH) 0.5 - 1.9 mmol/L   Comment NOTIFIED PHYSICIAN   I-Stat venous blood gas, ED     Status: Abnormal   Collection Time: 12/13/23  9:18 AM  Result Value Ref Range   pH, Ven 7.350 7.25 - 7.43   pCO2, Ven 43.9 (L) 44 - 60 mmHg   pO2, Ven 29 (LL) 32 - 45 mmHg   Bicarbonate 24.2 20.0 - 28.0 mmol/L   TCO2 26 22 - 32 mmol/L   O2 Saturation 51 %   Acid-base deficit 2.0 0.0 - 2.0 mmol/L   Sodium 136 135 - 145 mmol/L   Potassium 3.7 3.5 - 5.1 mmol/L   Calcium, Ion 1.14 (L) 1.15 - 1.40 mmol/L   HCT 41.0 36.0 - 46.0 %   Hemoglobin 13.9 12.0 - 15.0 g/dL   Sample type VENOUS    Comment NOTIFIED PHYSICIAN   I-Stat CG4 Lactic Acid     Status: Abnormal   Collection Time: 12/13/23 10:20 AM  Result Value Ref Range   Lactic Acid, Venous 4.8 (HH) 0.5 - 1.9 mmol/L   Comment NOTIFIED PHYSICIAN    DG Chest Port 1 View Result Date: 12/13/2023 CLINICAL DATA:  Evaluate for sepsis. EXAM: PORTABLE CHEST 1 VIEW  COMPARISON:  12/01/2022 FINDINGS: Interval removal of endotracheal tube and enteric tube. Stable cardiac enlargement. Aortic atherosclerosis. Persistent diffuse increase interstitial markings. Persistent small left pleural effusion. No new findings. Visualized osseous structures appear intact. IMPRESSION: 1. Interval removal of endotracheal tube and enteric tube. 2. Persistent diffuse increase interstitial markings compatible with pulmonary edema. 3. Persistent small left pleural effusion. Electronically Signed   By: Signa Kell M.D.   On: 12/13/2023 06:32    Pending Labs Unresulted Labs (From admission, onward)     Start     Ordered   12/14/23 0500  HIV Antibody (routine testing w rflx)  (HIV Antibody (Routine testing w reflex) panel)  Tomorrow morning,   R        12/13/23 1138   12/13/23 0554  Blood Culture (routine x 2)  (Undifferentiated presentation (screening labs and basic nursing orders))  BLOOD CULTURE X 2,   STAT      12/13/23 2694  Vitals/Pain Today's Vitals   12/13/23 1884 12/13/23 0751 12/13/23 1051 12/13/23 1130  BP:  107/63 (!) 98/59   Pulse:  (!) 114 (!) 123   Resp:  (!) 26 (!) 25   Temp:    (!) 97.4 F (36.3 C)  TempSrc:    Oral  SpO2: 99% 99% 100%   Weight:      Height:      PainSc: 0-No pain       Isolation Precautions No active isolations  Medications Medications  sodium chloride flush (NS) 0.9 % injection 10 mL (10 mLs Intravenous Not Given 12/13/23 1010)  heparin injection 5,000 Units (has no administration in time range)  acetaminophen (TYLENOL) tablet 650 mg (has no administration in time range)    Or  acetaminophen (TYLENOL) suppository 650 mg (has no administration in time range)  sodium chloride 0.9 % bolus 1,000 mL (0 mLs Intravenous Stopped 12/13/23 0855)    And  sodium chloride 0.9 % bolus 500 mL (0 mLs Intravenous Stopped 12/13/23 0855)    And  sodium chloride 0.9 % bolus 250 mL (0 mLs Intravenous Stopped 12/13/23 0702)   cefTRIAXone (ROCEPHIN) 2 g in sodium chloride 0.9 % 100 mL IVPB (0 g Intravenous Stopped 12/13/23 0734)  azithromycin (ZITHROMAX) 500 mg in sodium chloride 0.9 % 250 mL IVPB (0 mg Intravenous Stopped 12/13/23 0855)  acetaminophen (TYLENOL) tablet 650 mg (650 mg Oral Given 12/13/23 0740)  0.9 %  sodium chloride infusion ( Intravenous New Bag/Given 12/13/23 0901)    Mobility walks     Focused Assessments Pulmonary Assessment Handoff:  Lung sounds: L Breath Sounds: Clear R Breath Sounds: Clear O2 Device: Room Air      R Recommendations: See Admitting Provider Note  Report given to:   Additional Notes: right arm restricted. Received full dialysis on Saturday

## 2023-12-13 NOTE — Hospital Course (Addendum)
  Septic Shock due to  Klebsiella pneumoniae bacteremia Patient presented to the [name] ED with hypotension, tachycardia and respiratory distress found to be in new onset atrial flutter. Sepsis workup initiated in the ED, and she was given empiric IV ceftriaxone and azithromycin. Lactic acid peaked at 5.4 and down trended to 3.9. WBC WNL during admission. Blood cultures grew Klebsiella, so ceftriaxone was continued and azithromycin was discontinued as we waited for susceptibilities to return. Patient developed bullous lesion on hospital day 2. Concerned for drug reaction given patient's history of cephalosporin allergy, ceftriaxone transitioned to ciprofloxacin. Patient continued to remain tachycardic and hypotensive. Patient completed 5 day course of ciprofloxacin with clinical improvement, though remained tachycardic.   Atrial Flutter New onset atrial flutter on admission. Concerned if this caused the hypotension and SOB; however, once infection source confirmed, determined to be less likely. Cardiology evaluated the patient and recommended continued supportive care. Patient continued to exhibit 2:1 atrial flutter during admission.   R Leg Bullous Lesion  On hospital day 2, patient developed a bullous lesion on R thigh. Lesion worsened with development of two additional lesions on same thigh with evidence of skin sloughing. Originally, considered drug reaction vs active lupus flare vs infectious etiology. Patient has hx of rashes with keflex and cefazolin, so concerned for drug rash. Ceftriaxone was transitioned to cipro and lesion continued to worsen. Active lupus flare suspected, so lupus labs ordered and 3 day course of IV pulse steroids completed.   HFrEF EF 35-40% in 2023 with mild aortic stenosis and severe aortic regurgitation. CXR consistent with pulmonary edema and BNP was 4500 on admission with a euvolemic physical exam. Echo revealed during admission revealed EF of 30-35%, LV and RV  decreased function. Dilation of all four chambers. Significant valvular disease. Cardiology consulted and advised that she is not a candidate for surgery and recommended hospice consultation if she becomes symptomatic.   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 Nephrology consulted and recommended holding home coreg and hectorol and continuing sensipar and binders when eating during admission.   Substance Abuse Patient's last cocaine use on 12/12 prior to admission. Transition of Care was consulted.   Lupus Per chart review, patient followed by Dr. Kellie Simmering previously for SLE but has not followed up recently. In 2014, documentation stated she had not seen a Rheumatologist in over 5 years. 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 With developing R thigh bullous lesion, concern for lupus flare. 3 day course of pulse dose steroids completed. Rheumatology at [facility] consulted. Blood cultures ordered to ensure bacteria cleared before additional lupus medications to bo given.   Anemia of Chronic Disease Hb ranged from 12.0-13.9. Baseline ~11. Stable during admission.   Thrombocytopenia Platelets declined from 103 on admission to 61. Thought to be due to sepsis versus active lupus flare. Platelet levels stable on discharge.       Making progress, still having loose stools  Talked with daughter about nursing home distance  Hospice: didn't talk with daughter about this.... doesn't want to use the ambulance  Went over hospice vs SNF:

## 2023-12-13 NOTE — Progress Notes (Signed)
PHARMACY - PHYSICIAN COMMUNICATION CRITICAL VALUE ALERT - BLOOD CULTURE IDENTIFICATION (BCID)  [name] is an 42 y.o. female who presented to [facility] on 12/13/2023 with a chief complaint of shortness of breath. PMH includes ESRD on HD T/Th/Sat, lupus, and HFrEF (EF 35-40% in 2023).   Assessment:  2 out of 4 bottles of blood culture growing GNR. BCID showing Klebsiella pneumoniae.   Name of physician (or Provider) Contacted: Dr. Mickie Bail  Current antibiotics: Ceftriaxone and Azithromycin x1 dose each.   Changes to prescribed antibiotics recommended:  Recommendations accepted by provider - Continue Ceftriaxone 2g IV every 24 hours.   Results for orders placed or performed during the hospital encounter of 12/13/23  Blood Culture ID Panel (Reflexed) (Collected: 12/13/2023  6:10 AM)  Result Value Ref Range   Enterococcus faecalis NOT DETECTED NOT DETECTED   Enterococcus Faecium NOT DETECTED NOT DETECTED   Listeria monocytogenes NOT DETECTED NOT DETECTED   Staphylococcus species NOT DETECTED NOT DETECTED   Staphylococcus aureus (BCID) NOT DETECTED NOT DETECTED   Staphylococcus epidermidis NOT DETECTED NOT DETECTED   Staphylococcus lugdunensis NOT DETECTED NOT DETECTED   Streptococcus species NOT DETECTED NOT DETECTED   Streptococcus agalactiae NOT DETECTED NOT DETECTED   Streptococcus pneumoniae NOT DETECTED NOT DETECTED   Streptococcus pyogenes NOT DETECTED NOT DETECTED   A.calcoaceticus-baumannii NOT DETECTED NOT DETECTED   Bacteroides fragilis NOT DETECTED NOT DETECTED   Enterobacterales DETECTED (A) NOT DETECTED   Enterobacter cloacae complex NOT DETECTED NOT DETECTED   Escherichia coli NOT DETECTED NOT DETECTED   Klebsiella aerogenes NOT DETECTED NOT DETECTED   Klebsiella oxytoca NOT DETECTED NOT DETECTED   Klebsiella pneumoniae DETECTED (A) NOT DETECTED   Proteus species NOT DETECTED NOT DETECTED   Salmonella species NOT DETECTED NOT DETECTED   Serratia marcescens NOT  DETECTED NOT DETECTED   Haemophilus influenzae NOT DETECTED NOT DETECTED   Neisseria meningitidis NOT DETECTED NOT DETECTED   Pseudomonas aeruginosa NOT DETECTED NOT DETECTED   Stenotrophomonas maltophilia NOT DETECTED NOT DETECTED   Candida albicans NOT DETECTED NOT DETECTED   Candida auris NOT DETECTED NOT DETECTED   Candida glabrata NOT DETECTED NOT DETECTED   Candida krusei NOT DETECTED NOT DETECTED   Candida parapsilosis NOT DETECTED NOT DETECTED   Candida tropicalis NOT DETECTED NOT DETECTED   Cryptococcus neoformans/gattii NOT DETECTED NOT DETECTED   CTX-M ESBL NOT DETECTED NOT DETECTED   Carbapenem resistance IMP NOT DETECTED NOT DETECTED   Carbapenem resistance KPC NOT DETECTED NOT DETECTED   Carbapenem resistance NDM NOT DETECTED NOT DETECTED   Carbapenem resist OXA 48 LIKE NOT DETECTED NOT DETECTED   Carbapenem resistance VIM NOT DETECTED NOT DETECTED    Link Snuffer, PharmD, BCPS, BCCCP Please refer to [facility] for [facility] Pharmacy numbers 12/13/2023  7:21 PM

## 2023-12-13 NOTE — Sepsis Progress Note (Signed)
Elink will follow per sepsis protocol  

## 2023-12-13 NOTE — Consult Note (Signed)
Renal Service Consult Note [facility]  [name] 12/13/2023 [name] Krabbe, MD Requesting Physician: Dr. Antony Contras  Reason for Consult: ESRD pt w/ hypotension and SOB   HPI: The patient is a 42 y.o. year-old w/ PMH as below who presented to ED early today c/o SOB. In ED BP's in the 80-90s, HR 120s and RR 20s. No fever, WBC wnl. Also having coughing, some subjective fevers. No abd pain or n/v. +some diarrhea. Got her flu shot last week and has been feeling bad since. In ED she rec'd IVF"s for sepsis protocol, also IV abx. VS haven't improved but her LA was 2 then 5, now the last 2 are 4 then 3. So may be turning the corning. Pt admitted to teaching service. We are asked to see for dialysis.   Pt seen in room. Hx difficult as she is very somnolent and not giving much detail. Minimal hx obtained. Most of hx is per H&P.   ROS - n/a   Past Medical History  Past Medical History:  Diagnosis Date   Anemia    low iron - receives iron at dialysis   Anxiety    Arthritis    RA   Chronic systolic congestive heart failure (HCC) 03/16/2016   Dyspnea    ESRD (end stage renal disease) (HCC)    [facility] _ [place]   H/O pericarditis 01/17/2013   H/O pleural effusion 01/17/2013   Heart murmur    Lupus (systemic lupus erythematosus) (HCC)    Previously followed with Dr. Kellie Simmering, has not followed up recently   Lupus nephritis [facility]) 2006   Renal biopsy shows segmental endocapillary proliferation and cellular crescent formation (Class IIIA) and lupus membranous glomerulopathy (Class V, stage II)   Pneumonia    many times   Polysubstance abuse (HCC)    cocaine, MJ, tobacco   S/P pericardiocentesis 01/17/2013   H/o pericardial effusion with tamponade 2006    Seizures (HCC)    during pregnancy 1 time   Streptococcal bacteremia 01/23/2013   She had two S. pneumonae bacteremia on 01/21/2013. Sensitive to Peniccilin    Past Surgical History  Past Surgical History:   Procedure Laterality Date   AV FISTULA PLACEMENT     AV FISTULA PLACEMENT Right 08/01/2020   Procedure: RIGHT ARM BRACHIOCEPHALIC  ARTERIOVENOUS (AV) FISTULA CREATION;  Surgeon: Larina Earthly, MD;  Location: [facility] OR;  Service: Vascular;  Laterality: Right;   BASCILIC VEIN TRANSPOSITION Left 02/05/2014   Procedure: BASCILIC VEIN TRANSPOSITION;  Surgeon: Larina Earthly, MD;  Location[facility] OR;  Service: Vascular;  Laterality: Left;   BASCILIC VEIN TRANSPOSITION Right 01/08/2021   Procedure: RIGHT ARM SECOND STAGE BASCILIC VEIN TRANSPOSITION;  Surgeon: Larina Earthly, MD;  Location: [facility] OR;  Service: Vascular;  Laterality: Right;   BIOPSY  12/17/2021   Procedure: BIOPSY;  Surgeon: Lemar Lofty., MD;  Location: [facility] ENDOSCOPY;  Service: Gastroenterology;;   ESOPHAGOGASTRODUODENOSCOPY (EGD) WITH PROPOFOL N/A 12/17/2021   Procedure: ESOPHAGOGASTRODUODENOSCOPY (EGD) WITH PROPOFOL;  Surgeon: Lemar Lofty., MD;  Location: [facility] ENDOSCOPY;  Service: Gastroenterology;  Laterality: N/A;   FISTULA SUPERFICIALIZATION Left 05/30/2018   Procedure: FISTULA PLICATION BASILIC VEIN TRANSPOSITION;  Surgeon: Chuck Hint, MD;  Location: [facility] OR;  Service: Vascular;  Laterality: Left;   FISTULA SUPERFICIALIZATION Left 12/18/2019   Procedure: PLICATION OF LEFT ARTERIOVENOUS FISTULA ULCER;  Surgeon: Larina Earthly, MD;  Location: [facility] OR;  Service: Vascular;  Laterality: Left;   I & D EXTREMITY  Right 02/18/2021   Procedure: IRRIGATION AND DEBRIDEMENT OF ARM;  Surgeon: Chuck Hint, MD;  Location: [facility] OR;  Service: Vascular;  Laterality: Right;   INSERTION OF DIALYSIS CATHETER N/A 05/19/2020   Procedure: TUNNELED INSERTION  OF DIALYSIS CATHETER;  Surgeon: Maeola Harman, MD;  Location: [facility] OR;  Service: Vascular;  Laterality: N/A;   THROMBECTOMY AND REVISION OF ARTERIOVENTOUS (AV) GORETEX  GRAFT Left 07/28/2020   Procedure: Oversewing of left arm Brachial cephalic fistula for bleeding.;  Surgeon:  Chuck Hint, MD;  Location: [facility] OR;  Service: Vascular;  Laterality: Left;   VENOGRAM Right 01/31/2014   Procedure: DIALYSIS CATHETER;  Surgeon: Nada Libman, MD;  Location: [facility] CATH LAB;  Service: Cardiovascular;  Laterality: Right;   Family History No family history on file. Social History  reports that she has been smoking cigarettes. She has a 1.8 pack-year smoking history. She has been exposed to tobacco smoke. She has never used smokeless tobacco. She reports current alcohol use. She reports current drug use. Drug: Marijuana. Allergies  Allergies  Allergen Reactions   Cephalosporins Rash    To both keflex and cefazolin   Tobramycin Sulfate Swelling    Eye swelling   Vancomycin Swelling   Home medications Prior to Admission medications   Medication Sig Start Date End Date Taking? Authorizing Provider  Accu-Chek Softclix Lancets lancets USE UP TO 4 TIMES DAILY AS DIRECTED 12/14/22   [name], NP  acetaminophen (TYLENOL) 650 MG CR tablet Take 1,300 mg by mouth every 8 (eight) hours as needed for pain.    [provider]  albuterol (PROVENTIL) (2.5 MG/3ML) 0.083% nebulizer solution Take 3 mLs (2.5 mg total) by nebulization every 2 (two) hours as needed for wheezing or shortness of breath. 12/06/22   Gwenevere Abbot, MD  blood glucose meter kit and supplies KIT Dispense based on patient and insurance preference. Use up to four times daily as directed. 12/06/22   Gwenevere Abbot, MD  carvedilol (COREG) 12.5 MG tablet Take 12.5 mg by mouth daily. 11/07/19   [provider]  cinacalcet (SENSIPAR) 30 MG tablet Take 30 mg by mouth every evening. 11/24/22   [provider]  diphenhydrAMINE (BENADRYL) 25 MG tablet Take 1 tablet (25 mg total) by mouth every 4 (four) hours as needed for itching or allergies. Patient taking differently: Take 25 mg by mouth 2 (two) times daily. 01/23/21   Rolly Salter, MD  doxercalciferol (HECTOROL) 4 MCG/2ML injection Use as  recommended by nephrology Patient taking differently: 4 mcg Every Tuesday,Thursday,and Saturday with dialysis. Use as recommended by nephrology 11/05/20   Kathlen Mody, MD  glucose blood (ACCU-CHEK GUIDE) test strip USE UP TO 4 TIMES DAILY AS DIRECTED 12/14/22   [name], NP  levETIRAcetam (KEPPRA) 500 MG tablet TAKE 1 TABLET(500 MG) BY MOUTH TWICE DAILY 01/08/23   [name], NP  loperamide (IMODIUM A-D) 2 MG tablet Take 4 mg by mouth daily as needed for diarrhea or loose stools. Dialysis days    [provider]  multivitamin (RENA-VIT) TABS tablet Take 1 tablet by mouth daily.    [provider]  Nutritional Supplements (NOVASOURCE RENAL) LIQD Take 237 mLs by mouth See admin instructions. Take 1 container (237 mls) by at bedtime on dialysis days (Tuesday, Thursday, Saturday)    [provider]  ondansetron (ZOFRAN-ODT) 4 MG disintegrating tablet Take 1 tablet (4 mg total) by mouth every 8 (eight) hours as needed for nausea or vomiting. Patient  not taking: Reported on 11/30/2022 12/18/21   Celedonio Savage, MD  pantoprazole (PROTONIX) 40 MG tablet Take 1 tablet (40 mg total) by mouth 2 (two) times daily before a meal. Patient not taking: Reported on 04/12/2022 12/18/21 01/17/22  Celedonio Savage, MD  sevelamer carbonate (RENVELA) 800 MG tablet Take 2,400 mg by mouth 3 (three) times daily. 02/10/22   [provider]     Vitals:   12/13/23 1051 12/13/23 1130 12/13/23 1145 12/13/23 1300  BP: (!) 98/59  95/74 96/63  Pulse: (!) 123     Resp: (!) 25  (!) 26 (!) 23  Temp:  (!) 97.4 F (36.3 C)    TempSrc:  Oral    SpO2: 100%     Weight:      Height:       Exam Gen frail, thin adult female, very tired, wakes up and answering questions appropriately No rash, cyanosis or gangrene Sclera anicteric, throat clear  No jvd or bruits Chest clear bilat to bases, no rales/ wheezing RRR no RG Abd soft ntnd no mass or ascites +bs GU defer MS no joint  effusions or deformity Ext no LE or UE edema, no wounds or ulcers Neuro is Ox3, fatigues significantly, nonfocal    RUA AVF+bruit       Renal-related home meds: - coreg 12.5 qd - sensipar 30 hs - renavite - revela 3 ac tid    [facility]  3h  400/1.5   45kg  2/2 bath AVF  Heparin none - last OP HD 12/14, post wt 45.7kg - making it to her dry wt, occ under - mircera 30 q 4, last 11/25, due 12/23  - hectorol 6    Assessment/ Plan: Sepsis - w/ hypotension, Macarthur Critchley and ^HR. In ED she rec'd IVF"s, IV abx. LA 2.9 -> 5.4 -> 4.8 -> 3.7. Seems to be settling down somewhat. Very fatigued. Per pmd.  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 Last HD Sat. Plan HD tomorrow.  BP - BP's soft, holdly home coreg. Getting IVF"s. Considering to 50-75 cc/hr if her sepsis parameters start to improve.  Volume - euvolemic on exam. CXR shows diffuse IS pattern that looks like her baseline looking back. ^BNP may reflect her chronic heart failure. On RA, lungs clear, no edema. Get wt's when in a room.  Anemia of eskd - Hb here 13 - last 3 outpt Hb's were 11.8, 12.1 and 12.9 on 12/12. Close to baseline.  MBD ckd - CCa sig elevated ~ 11.4, hold hectorol for now. Cont sensipar and binders when eating.  H/o SLE      Vinson Moselle  MD [facility] 12/13/2023, 2:13 PM  Recent Labs  Lab 12/13/23 0610 12/13/23 0918  HGB 13.8 13.9  ALBUMIN 2.4*  --   CALCIUM 10.2  --   CREATININE 6.55*  --   K 3.9 3.7   Inpatient medications:  heparin  5,000 Units Subcutaneous Q8H   sodium chloride flush  10 mL Intravenous Q12H    [START ON 12/14/2023] azithromycin     [START ON 12/14/2023] cefTRIAXone (ROCEPHIN)  IV     acetaminophen **OR** acetaminophen

## 2023-12-13 NOTE — ED Notes (Signed)
Pt continue to removed gown. Voiced to pt to keep gown on.

## 2023-12-13 NOTE — ED Provider Notes (Signed)
[place] EMERGENCY DEPARTMENT AT [facility] Provider Note   CSN: [id number] Arrival date & time: 12/13/23  0548     History  Chief Complaint  Patient presents with   Hypotension   Level 5 caveat due to acuity of condition [name] is a 42 y.o. female.  The history is provided by the patient and the EMS personnel. The history is limited by the condition of the patient.  Patient with history of lupus, ESRD, chronic systolic heart failure presents with shortness of breath Patient reports her last dialysis was on December 14 She reports over the past day she has had increasing cough and shortness of breath.  She reports shortness of breath with exertion.  She also reports feeling feverish No vomiting or diarrhea.  EMS reports on their arrival patient was ill-appearing and hypotensive    Past Medical History:  Diagnosis Date   Anemia    low iron - receives iron at dialysis   Anxiety    Arthritis    RA   Chronic systolic congestive heart failure (HCC) 03/16/2016   Dyspnea    ESRD (end stage renal disease) (HCC)    [facility] _ [place]   H/O pericarditis 01/17/2013   H/O pleural effusion 01/17/2013   Heart murmur    Lupus (systemic lupus erythematosus) (HCC)    Previously followed with Dr. 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 pneumonae bacteremia on 01/21/2013. Sensitive to Peniccilin     Home Medications Prior to Admission medications   Medication Sig Start Date End Date Taking? Authorizing Provider   Accu-Chek Softclix Lancets lancets USE UP TO 4 TIMES DAILY AS DIRECTED 12/14/22   [name], NP  acetaminophen (TYLENOL) 650 MG CR tablet Take 1,300 mg by mouth every 8 (eight) hours as needed for pain.    [provider]  albuterol (PROVENTIL) (2.5 MG/3ML) 0.083% nebulizer solution Take 3 mLs (2.5 mg total) by nebulization every 2 (two) hours as needed for wheezing or shortness of breath. 12/06/22   Gwenevere Abbot, MD  blood glucose meter kit and supplies KIT Dispense based on patient and insurance preference. Use up to four times daily as directed. 12/06/22   Gwenevere Abbot, MD  carvedilol (COREG) 12.5 MG tablet Take 12.5 mg by mouth daily. 11/07/19   [provider]  cinacalcet (SENSIPAR) 30 MG tablet Take 30 mg by mouth every evening. 11/24/22   [provider]  diphenhydrAMINE (BENADRYL) 25 MG tablet Take 1 tablet (25 mg total) by mouth every 4 (four) hours as needed for itching or allergies. Patient taking differently: Take 25 mg by mouth 2 (two) times daily. 01/23/21   Rolly Salter, MD  doxercalciferol (HECTOROL) 4 MCG/2ML injection Use as recommended by nephrology Patient taking differently: 4 mcg Every Tuesday,Thursday,and Saturday with dialysis. Use as recommended by nephrology 11/05/20   Kathlen Mody, MD  glucose blood (ACCU-CHEK GUIDE) test strip USE UP TO 4 TIMES DAILY AS DIRECTED 12/14/22   [name], NP  levETIRAcetam (  KEPPRA) 500 MG tablet TAKE 1 TABLET(500 MG) BY MOUTH TWICE DAILY 01/08/23   [name], NP  loperamide (IMODIUM A-D) 2 MG tablet Take 4 mg by mouth daily as needed for diarrhea or loose stools. Dialysis days    [provider]  multivitamin (RENA-VIT) TABS tablet Take 1 tablet by mouth daily.    [provider]  Nutritional Supplements (NOVASOURCE RENAL) LIQD Take 237 mLs by mouth See admin instructions. Take 1 container (237 mls) by at bedtime on dialysis days (Tuesday, Thursday, Saturday)    [provider]  ondansetron (ZOFRAN-ODT) 4 MG disintegrating tablet Take 1 tablet (4 mg total) by mouth every 8 (eight) hours as needed for nausea or vomiting. Patient not taking: Reported on 11/30/2022 12/18/21   Celedonio Savage, MD  pantoprazole (PROTONIX) 40 MG tablet Take 1 tablet (40 mg total) by mouth 2 (two) times daily before a meal. Patient not taking: Reported on 04/12/2022 12/18/21 01/17/22  Celedonio Savage, MD  sevelamer carbonate (RENVELA) 800 MG tablet Take 2,400 mg by mouth 3 (three) times daily. 02/10/22   [provider]      Allergies    Cephalosporins, Tobramycin sulfate, and Vancomycin    Review of Systems   Review of Systems  Unable to perform ROS: Acuity of condition    Physical Exam Updated Vital Signs BP 102/68   Pulse (!) 131   Temp 97.7 F (36.5 C) (Oral)   Resp (!) 26   Ht 1.6 m (5\' 3" )   Wt 55 kg   SpO2 99%   BMI 21.48 kg/m  Physical Exam CONSTITUTIONAL: Ill-appearing HEAD: Normocephalic/atraumatic EYES: EOMI ENMT: Mucous membranes dry NECK: supple no meningeal signs CV: S1/S2 noted LUNGS: Tachypneic, coarse breath sounds bilaterally ABDOMEN: soft, nontender GU:no cva tenderness NEURO: Pt is awake/alert/appropriate, moves all extremitiesx4.  No facial droop.   SKIN: warm, color normal PSYCH: Anxious  ED Results / Procedures / Treatments   Labs (all labs ordered are listed, but only abnormal results are displayed) Labs Reviewed  CBC WITH DIFFERENTIAL/PLATELET - Abnormal; Notable for the following components:      Result Value   RDW 18.6 (*)    Platelets 103 (*)    nRBC 0.4 (*)    Abs Immature Granulocytes 0.09 (*)    All other components within normal limits  PROTIME-INR - Abnormal; Notable for the following components:   Prothrombin Time 18.6 (*)    INR 1.5 (*)    All other components within normal limits  I-STAT CG4 LACTIC ACID, ED - Abnormal; Notable for the following components:   Lactic Acid, Venous 2.9 (*)    All other  components within normal limits  CULTURE, BLOOD (ROUTINE X 2)  CULTURE, BLOOD (ROUTINE X 2)  RESP PANEL BY RT-PCR (RSV, FLU A&B, COVID)  RVPGX2  APTT  COMPREHENSIVE METABOLIC PANEL  HCG, SERUM, QUALITATIVE    EKG EKG Interpretation Date/Time:  Monday December 13 2023 05:59:30 EST Ventricular Rate:  113 PR Interval:    QRS Duration:  101 QT Interval:  360 QTC Calculation: 490 R Axis:   112  Text Interpretation: Atrial flutter Right axis deviation Consider left ventricular hypertrophy Confirmed by Zadie Rhine ([id number]) on 12/13/2023 6:16:23 AM  Radiology DG Chest Port 1 View Result Date: 12/13/2023 CLINICAL DATA:  Evaluate for sepsis. EXAM: PORTABLE CHEST 1 VIEW COMPARISON:  12/01/2022 FINDINGS: Interval removal of endotracheal tube and enteric tube. Stable cardiac enlargement. Aortic atherosclerosis. Persistent diffuse increase interstitial markings. Persistent small  left pleural effusion. No new findings. Visualized osseous structures appear intact. IMPRESSION: 1. Interval removal of endotracheal tube and enteric tube. 2. Persistent diffuse increase interstitial markings compatible with pulmonary edema. 3. Persistent small left pleural effusion. Electronically Signed   By: Signa Kell M.D.   On: 12/13/2023 06:32    Procedures .Critical Care  Performed by: Zadie Rhine, MD Authorized by: Zadie Rhine, MD   Critical care provider statement:    Critical care time (minutes):  60   Critical care start time:  12/13/2023 5:50 AM   Critical care end time:  12/13/2023 6:50 AM   Critical care time was exclusive of:  Separately billable procedures and treating other patients   Critical care was necessary to treat or prevent imminent or life-threatening deterioration of the following conditions:  Sepsis, dehydration, metabolic crisis and renal failure   Critical care was time spent personally by me on the following activities:  Examination of patient, ordering and review of  radiographic studies, ordering and review of laboratory studies, ordering and performing treatments and interventions, pulse oximetry, re-evaluation of patient's condition, review of old charts, development of treatment plan with patient or surrogate and evaluation of patient's response to treatment   I assumed direction of critical care for this patient from another provider in my specialty: no       Medications Ordered in ED Medications  sodium chloride flush (NS) 0.9 % injection 10 mL (has no administration in time range)  sodium chloride 0.9 % bolus 1,000 mL (1,000 mLs Intravenous New Bag/Given 12/13/23 0639)    And  sodium chloride 0.9 % bolus 500 mL (500 mLs Intravenous New Bag/Given 12/13/23 0655)    And  sodium chloride 0.9 % bolus 250 mL (0 mLs Intravenous Stopped 12/13/23 0702)  cefTRIAXone (ROCEPHIN) 2 g in sodium chloride 0.9 % 100 mL IVPB (2 g Intravenous New Bag/Given 12/13/23 0659)  azithromycin (ZITHROMAX) 500 mg in sodium chloride 0.9 % 250 mL IVPB (has no administration in time range)  acetaminophen (TYLENOL) tablet 650 mg (has no administration in time range)    ED Course/ Medical Decision Making/ A&P Clinical Course as of 12/13/23 0702  Mon Dec 13, 2023  0600 Patient multiple medical conditions arise with cough and shortness of breath.  Patient is ill-appearing with frequent coughing during exam She is protecting her airway that does appear short of breath Extensive workup has been ordered [DW]  0615 Current blood pressure is appropriate.  However patient is tachypneic and tachycardic.  EKG reveals atrial flutter but she is not on anticoagulation.  Reports increasing cough congestion and dyspnea on exertion.  Patient refused to lie supine, only lies on her right side because she says it makes her feel better.  She denies any pain at this time [DW]  [phone] Patient reports she had a full dialysis session on December 14.  She does report cough shortness of breath and  bodyaches.  Strong suspicion this patient has influenza [DW]  0701 Signed out to Dr. Theresia Lo at shift change.  Will start sepsis protocol for presumed respiratory infectious etiology, but if she has influenza this can be scaled back . Pt also appears to have new onsets aflutter [DW]    Clinical Course User Index [DW] Zadie Rhine, MD         CHA2DS2-VASc Score: 5                        Medical Decision Making Amount and/or Complexity  of Data Reviewed Labs: ordered. Radiology: ordered. ECG/medicine tests: ordered.  Risk OTC drugs.   This patient presents to the ED for concern of shortness of breath, this involves an extensive number of treatment options, and is a complaint that carries with it a high risk of complications and morbidity.  The differential diagnosis includes but is not limited to Acute coronary syndrome, pneumonia, acute pulmonary edema, pneumothorax, acute anemia, pulmonary embolism    Comorbidities that complicate the patient evaluation: Patient's presentation is complicated by their history of ESRD, CHF  Social Determinants of Health: Patient's  poor mobility   increases the complexity of managing their presentation  Additional history obtained: Additional history obtained from EMS  Records reviewed previous admission documents  Lab Tests: I Ordered, and personally interpreted labs.  The pertinent results include: Mild thrombocytopenia  Imaging Studies ordered: I ordered imaging studies including X-ray chest   I independently visualized and interpreted imaging which showed pulmonary edema I agree with the radiologist interpretation  Cardiac Monitoring: The patient was maintained on a cardiac monitor.  I personally viewed and interpreted the cardiac monitor which showed an underlying rhythm of:  Atrial Flutter  Medicines ordered and prescription drug management: I ordered medication including IV fluids for presumed sepsis   Critical  Interventions:   fluids, admission   Reevaluation: After the interventions noted above, I reevaluated the patient and found that they have :stayed the same  Complexity of problems addressed: Patient's presentation is most consistent with  acute presentation with potential threat to life or bodily function  Disposition: After consideration of the diagnostic results and the patient's response to treatment,  I feel that the patent would benefit from admission   .           Final Clinical Impression(s) / ED Diagnoses Final diagnoses:  None    Rx / DC Orders ED Discharge Orders     None         Zadie Rhine, MD 12/13/23 [phone]

## 2023-12-13 NOTE — ED Notes (Signed)
Patient refused rectal temperature 

## 2023-12-13 NOTE — Progress Notes (Signed)
ED Pharmacy Antibiotic Sign Off An antibiotic consult was received from an ED provider for Levaquin per pharmacy dosing for CAP. Per protocol, switched antibiotic therapy to ceftriaxone and flagyl due to history of tolerating cephalosporins per chart review.   The following one time order(s) were placed:  Ceftriaxone 2g Azithromycin 500mg   Further antibiotic and/or antibiotic pharmacy consults should be ordered by the admitting provider if indicated.   Thank you for allowing pharmacy to be a part of this patient's care.   Marja Kays, [facility]  Clinical Pharmacist 12/13/23 6:44 AM

## 2023-12-13 NOTE — Progress Notes (Signed)
Pt receives out-pt HD at [place] on TTS. Will assist as needed.   Melven Sartorius Renal Navigator [phone]

## 2023-12-13 NOTE — ED Triage Notes (Signed)
Patient c/o ShOB and cough, states she gets out of breath when she is walking.

## 2023-12-13 NOTE — ED Notes (Signed)
Pt incontinent of bowel at this time. Perineal care provided, pt c/o pain when providing care, otherwise tolerated well. Pt discontinue 18g IV from LAC, IV catheter intact, dressing applied.

## 2023-12-13 NOTE — H&P (Addendum)
Date: 12/13/2023               Patient Name:  [name] MRN: [id number]  DOB: 17-Nov-1981 Age / Sex: 42 y.o., female   PCP: [name], NP         Medical Service: Internal Medicine Teaching Service         Attending Physician: Dr. Reymundo Poll, MD      First Contact: Dr. Annett Fabian, MD Pager [phone]    Second Contact: Dr. [name], MD Pager [phone]         After Hours (After 5p/  First Contact Pager: [phone]  weekends / holidays): Second Contact Pager: [phone]   SUBJECTIVE   Chief Complaint: SOB  History of Present Illness: [name] is a 42 yo female with ESRD on HD TTS, lupus, and HFrEF (EF 35-40% in 2023) who presents with SOB.  History is limited due to poor patient cooperation.The patient states that over the last few days she has had worsening shortness of breath and a cough. She feels feverish, but has not checked her temp at home. She denies any chest pain, abd pain, or vomiting. She does endorse diarrhea, but is unable to tell me when this started or how many episodes she is having per day.   The patient's mother states that the patient had her flu shot late last week and she has not been feeling well since then. She is not able to provide much more information.   Meds:  Patient is unsure what medications she takes, but med list is as follows: Albuterol PRN Coreg 12.5 mg daily Cinacalcet 30 mg at bedtime Doxercalciferol with HD Keppra 500 mg BID Rena-Vit tabs Protonix 40 mg daily Renvela 2400 mg TID  No outpatient medications have been marked as taking for the 12/13/23 encounter [facility] Encounter).    Past Medical History  Past Surgical History:  Procedure Laterality Date   AV FISTULA PLACEMENT     AV FISTULA PLACEMENT Right 08/01/2020   Procedure: RIGHT ARM BRACHIOCEPHALIC  ARTERIOVENOUS (AV) FISTULA CREATION;  Surgeon: Larina Earthly, MD;  Location: [facility] OR;  Service: Vascular;  Laterality: Right;   BASCILIC VEIN  TRANSPOSITION Left 02/05/2014   Procedure: BASCILIC VEIN TRANSPOSITION;  Surgeon: Larina Earthly, MD;  Location: [facility] OR;  Service: Vascular;  Laterality: Left;   BASCILIC VEIN TRANSPOSITION Right 01/08/2021   Procedure: RIGHT ARM SECOND STAGE BASCILIC VEIN TRANSPOSITION;  Surgeon: Larina Earthly, MD;  Location: [facility] OR;  Service: Vascular;  Laterality: Right;   BIOPSY  12/17/2021   Procedure: BIOPSY;  Surgeon: Lemar Lofty., MD;  Location: [facility] ENDOSCOPY;  Service: Gastroenterology;;   ESOPHAGOGASTRODUODENOSCOPY (EGD) WITH PROPOFOL N/A 12/17/2021   Procedure: ESOPHAGOGASTRODUODENOSCOPY (EGD) WITH PROPOFOL;  Surgeon: Lemar Lofty., MD;  Location: [facility] ENDOSCOPY;  Service: Gastroenterology;  Laterality: N/A;   FISTULA SUPERFICIALIZATION Left 05/30/2018   Procedure: FISTULA PLICATION BASILIC VEIN TRANSPOSITION;  Surgeon: Chuck Hint, MD;  Location: [facility] OR;  Service: Vascular;  Laterality: Left;   FISTULA SUPERFICIALIZATION Left 12/18/2019   Procedure: PLICATION OF LEFT ARTERIOVENOUS FISTULA ULCER;  Surgeon: Larina Earthly, MD;  Location: [facility] OR;  Service: Vascular;  Laterality: Left;   I & D EXTREMITY Right 02/18/2021   Procedure: IRRIGATION AND DEBRIDEMENT OF ARM;  Surgeon: Chuck Hint, MD;  Location: [facility] OR;  Service: Vascular;  Laterality: Right;   INSERTION OF DIALYSIS CATHETER N/A 05/19/2020   Procedure: TUNNELED INSERTION  OF DIALYSIS CATHETER;  Surgeon:  Maeola Harman, MD;  Location: [facility] OR;  Service: Vascular;  Laterality: N/A;   THROMBECTOMY AND REVISION OF ARTERIOVENTOUS (AV) GORETEX  GRAFT Left 07/28/2020   Procedure: Oversewing of left arm Brachial cephalic fistula for bleeding.;  Surgeon: Chuck Hint, MD;  Location: [facility] OR;  Service: Vascular;  Laterality: Left;   VENOGRAM Right 01/31/2014   Procedure: DIALYSIS CATHETER;  Surgeon: Nada Libman, MD;  Location: [facility] CATH LAB;  Service: Cardiovascular;  Laterality: Right;    Social:  Lives With:  mother  PCP: Ricky Stabs, NP Substances: denies current tobacco, alcohol use Mother states that she uses cocaine (last use on 12/12), no IV drug use  Family History: Unable to obtain history from patient  Allergies: Allergies as of 12/13/2023 - Review Complete 12/13/2023  Allergen Reaction Noted   Cephalosporins Rash 01/23/2021   Tobramycin sulfate Swelling 07/05/2012   Vancomycin Swelling 04/16/2022    Review of Systems: A complete ROS was negative except as per HPI.   OBJECTIVE:   Physical Exam: Blood pressure (!) 98/59, pulse (!) 123, temperature 97.7 F (36.5 C), temperature source Oral, resp. rate (!) 25, height 5\' 3"  (1.6 m), weight 55 kg, SpO2 99%.   Constitutional: ill-appearing female sitting in chair, appears uncomfortable but in no acute distress Cardiovascular: tachycardic rate and regular rhythm, systolic murmur Pulmonary/Chest: slightly increased work of breathing on room air, coarse breath sounds bilaterally Abdominal: soft, non-tender, non-distended MSK: RUE AVF w/ palpable thrill, no LE edema Neurological: oriented to person, place, situation. Somnolent and requires frequent redirection Skin: warm and dry Psych: appropriate  Labs: CBC    Component Value Date/Time   WBC 8.2 12/13/2023 0610   RBC 4.63 12/13/2023 0610   HGB 13.9 12/13/2023 0918   HCT 41.0 12/13/2023 0918   PLT 103 (L) 12/13/2023 0610   MCV 89.0 12/13/2023 0610   MCH 29.8 12/13/2023 0610   MCHC 33.5 12/13/2023 0610   RDW 18.6 (H) 12/13/2023 0610   LYMPHSABS 0.7 12/13/2023 0610   MONOABS 0.4 12/13/2023 0610   EOSABS 0.0 12/13/2023 0610   BASOSABS 0.0 12/13/2023 0610     CMP     Component Value Date/Time   NA 136 12/13/2023 0918   K 3.7 12/13/2023 0918   CL 91 (L) 12/13/2023 0610   CO2 26 12/13/2023 0610   GLUCOSE 103 (H) 12/13/2023 0610   BUN 51 (H) 12/13/2023 0610   CREATININE 6.55 (H) 12/13/2023 0610   CALCIUM 10.2 12/13/2023 0610   CALCIUM 6.7 (L) 01/31/2014 1309   PROT  7.6 12/13/2023 0610   ALBUMIN 2.4 (L) 12/13/2023 0610   AST 26 12/13/2023 0610   ALT 27 12/13/2023 0610   ALKPHOS 199 (H) 12/13/2023 0610   BILITOT 2.7 (H) 12/13/2023 0610   GFRNONAA 8 (L) 12/13/2023 0610   GFRAA 9 (L) 08/02/2020 1049    Imaging: CXR: Pulmonary edema and left sided pleural effusion seen  EKG: personally reviewed my interpretation is atrial flutter (new onset compared to previous EKG).  ASSESSMENT & PLAN:   Assessment & Plan by Problem: Principal Problem:   Sepsis (HCC)   IMONIE SPELL is a 42 y.o. person living with a history of ESRD on HD TTS, lupus, and HFrEF (EF 35-40% in 2023) who presented with SOB and admitted for sepsis on hospital day 0  Sepsis rule out Hypotension Patient was hypotensive with EMS, although BP has stabilized in the ED after getting IVF. The patient is still tachycardic with rates in the 110-120s, tachypnneic  with RR in the 20s, and lactic acid is elevated to 2.9 and peaked at 5.4. Blood cultures drawn in the ED and patient was given azithromycin and ceftriaxone. Lactic is now downtrending. She does not have an elevated white count, but infection is not ruled out yet.  - Follow up blood cultures - Continue ceftriaxone, azithromycin - NS 100 cc/hr - BMP, CBC in the morning  New onset atrial flutter New onset atrial flutter could be the etiology of this patient's hypotension and shortness of breath, or, the atrial flutter could be caused by this patient's sepsis. - Consulted cardiology for symptomatic a flutter - CTA to rule out PE  HFrEF EF 35-40% in 2023 with mild aortic stenosis and severe aortic regurgitation. CXR consistent with pulmonary edema and BNP is 4500, although she is euvolemic on exam. - Volume removal with HD - Obtain updated echo  ESRD on HD TTS Last HD session 12/14 - according to her mother they did not take any fluid off at that time because her blood pressure was low.  - Nephrology consulted for HD  Substance  abuse Per patient's mother, she last used cocaine on 12/12.  - TOC consulted  Lupus Not on any therapies for this.   Anemia of chronic kidney disease Hb 13, up from baseline of ~11.  Thrombocytopenia Platelets 103 and were within normal range one year ago. Suspect this is secondary to the patient's sepsis. - CBC in the morning   Diet: Renal VTE: Heparin IVF: NS,100cc/hr Code: Full  Prior to Admission Living Arrangement: Home, living mom Anticipated Discharge Location: Home Barriers to Discharge: medical stability  Dispo: Admit patient to  observation  Signed: Chauncey Mann, DO Internal Medicine Resident PGY-3  12/13/2023, 11:44 AM

## 2023-12-13 NOTE — Consult Note (Signed)
Cardiology Consultation   Patient ID: [name] MRN: [id number]; DOB: 1981/11/17  Admit date: 12/13/2023 Date of Consult: 12/13/2023  PCP:  [name], NP   [facility] Providers Cardiologist:  Jodelle Red, MD   {    Patient Profile:   [name] is a 42 y.o. female with a hx of ESRD,  who is being seen 12/13/2023 for the evaluation of atrial flutter  at the request of  Dr.  Arta Silence.  History of Present Illness:   [name] is a 42 yo with ESRD.  She has altered mental status.  She has an altered mental status tonight.  She was not able to provide any meaningful history.  The following history is from chart review and physical exam.  She answered yes and no to all questions   She apparently has been having some shortness of breath according to Dr. Vida Roller.  She has been having some diarrhea.  She has a loud systolic and diastolic murmur  Echocardiogram from December, 2023 reveals moderately reduced LVEF of 35 to 40%.  There was mild LVH. RV function is moderately reduced with moderate elevation of pulmonary artery pressure.  There is severe left atrial enlargement.  Moderate mitral stenosis with a thickened and restrictive valve and subvalvular apparatus. Mean mitral valve gradient was 6 mmHg. Severe mitral regurgitation.  She has severe aortic insufficiency.  The aortic valve is thickened and there is a mean aortic valve gradient of 12 mmHg.      Past Medical History:  Diagnosis Date   Anemia    low iron - receives iron at dialysis   Anxiety    Arthritis    RA   Chronic systolic congestive heart failure (HCC) 03/16/2016   Dyspnea    ESRD (end stage renal disease) (HCC)    [facility] _ [place]   H/O pericarditis 01/17/2013   H/O pleural effusion 01/17/2013   Heart murmur    Lupus (systemic lupus erythematosus) (HCC)    Previously followed with Dr. 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 pneumonae bacteremia on 01/21/2013. Sensitive to Peniccilin     Past Surgical History:  Procedure Laterality Date   AV FISTULA PLACEMENT     AV FISTULA PLACEMENT Right 08/01/2020   Procedure: RIGHT ARM BRACHIOCEPHALIC  ARTERIOVENOUS (AV) FISTULA CREATION;  Surgeon: Larina Earthly, MD;  Location: [facility] OR;  Service: Vascular;  Laterality: Right;   BASCILIC VEIN TRANSPOSITION Left 02/05/2014   Procedure: BASCILIC VEIN TRANSPOSITION;  Surgeon: Larina Earthly, MD;  Location: [facility] OR;  Service: Vascular;  Laterality: Left;   BASCILIC VEIN TRANSPOSITION Right 01/08/2021   Procedure: RIGHT ARM SECOND STAGE BASCILIC VEIN TRANSPOSITION;  Surgeon: Larina Earthly, MD;  Location: [facility] OR;  Service: Vascular;  Laterality: Right;   BIOPSY  12/17/2021   Procedure: BIOPSY;  Surgeon: Lemar Lofty., MD;  Location: [facility] ENDOSCOPY;  Service: Gastroenterology;;   ESOPHAGOGASTRODUODENOSCOPY (EGD) WITH PROPOFOL N/A 12/17/2021   Procedure: ESOPHAGOGASTRODUODENOSCOPY (EGD) WITH PROPOFOL;  Surgeon: Lemar Lofty., MD;  Location: [facility] ENDOSCOPY;  Service: Gastroenterology;  Laterality: N/A;   FISTULA SUPERFICIALIZATION Left 05/30/2018   Procedure: FISTULA PLICATION BASILIC VEIN TRANSPOSITION;  Surgeon: Chuck Hint, MD;  Location[facility] OR;  Service: Vascular;  Laterality: Left;   FISTULA SUPERFICIALIZATION Left 12/18/2019   Procedure: PLICATION OF LEFT ARTERIOVENOUS FISTULA ULCER;  Surgeon: Larina Earthly, MD;  Location: [facility] OR;  Service: Vascular;  Laterality: Left;   I & D EXTREMITY Right 02/18/2021    Procedure: IRRIGATION AND DEBRIDEMENT OF ARM;  Surgeon: Chuck Hint, MD;  Location: [facility] OR;  Service: Vascular;  Laterality: Right;   INSERTION OF DIALYSIS CATHETER N/A 05/19/2020   Procedure: TUNNELED INSERTION  OF DIALYSIS CATHETER;  Surgeon: Maeola Harman, MD;  Location: [facility] OR;  Service: Vascular;  Laterality: N/A;   THROMBECTOMY AND REVISION OF ARTERIOVENTOUS (AV) GORETEX  GRAFT Left 07/28/2020   Procedure: Oversewing of left arm Brachial cephalic fistula for bleeding.;  Surgeon: Chuck Hint, MD;  Location: [facility] OR;  Service: Vascular;  Laterality: Left;   VENOGRAM Right 01/31/2014   Procedure: DIALYSIS CATHETER;  Surgeon: Nada Libman, MD;  Location: [facility] CATH LAB;  Service: Cardiovascular;  Laterality: Right;     Home Medications:  Prior to Admission medications   Medication Sig Start Date End Date Taking? Authorizing Provider  Accu-Chek Softclix Lancets lancets USE UP TO 4 TIMES DAILY AS DIRECTED 12/14/22   [name], NP  acetaminophen (TYLENOL) 650 MG CR tablet Take 1,300 mg by mouth every 8 (eight) hours as needed for pain.    [provider]  albuterol (PROVENTIL) (2.5 MG/3ML) 0.083% nebulizer solution Take 3 mLs (2.5 mg total) by nebulization every 2 (two) hours as needed for wheezing or shortness of breath. 12/06/22   Gwenevere Abbot, MD  blood glucose meter kit and supplies KIT Dispense based on patient and insurance preference. Use up to four times daily as directed. 12/06/22   Gwenevere Abbot, MD  carvedilol (COREG) 12.5 MG tablet Take 12.5 mg by mouth daily. 11/07/19   [provider]  cinacalcet (SENSIPAR) 30 MG tablet Take 30 mg by mouth every evening. 11/24/22   [provider]  diphenhydrAMINE (BENADRYL) 25 MG tablet Take 1 tablet (25 mg total) by mouth every 4 (four) hours as needed for itching or allergies. Patient taking differently: Take 25 mg by mouth 2 (two) times daily. 01/23/21   Rolly Salter, MD   doxercalciferol (HECTOROL) 4 MCG/2ML injection Use as recommended by nephrology Patient taking differently: 4 mcg Every Tuesday,Thursday,and Saturday with dialysis. Use as recommended by nephrology 11/05/20   Kathlen Mody, MD  glucose blood (ACCU-CHEK GUIDE) test strip USE UP TO 4 TIMES DAILY AS DIRECTED 12/14/22   [name], NP  levETIRAcetam (KEPPRA) 500 MG tablet TAKE 1 TABLET(500 MG) BY MOUTH TWICE DAILY 01/08/23   [name], NP  loperamide (IMODIUM A-D) 2 MG tablet Take 4 mg by mouth daily as needed for diarrhea or loose stools. Dialysis days    [provider]  multivitamin (RENA-VIT) TABS tablet Take 1 tablet by mouth daily.    [provider]  Nutritional Supplements (NOVASOURCE RENAL) LIQD Take 237 mLs by mouth See admin instructions. Take 1 container (237 mls) by at bedtime on dialysis days (Tuesday, Thursday, Saturday)    [provider]  ondansetron (ZOFRAN-ODT) 4 MG disintegrating tablet Take 1 tablet (4 mg total) by mouth every 8 (eight) hours as needed for nausea or  vomiting. Patient not taking: Reported on 11/30/2022 12/18/21   Celedonio Savage, MD  pantoprazole (PROTONIX) 40 MG tablet Take 1 tablet (40 mg total) by mouth 2 (two) times daily before a meal. Patient not taking: Reported on 04/12/2022 12/18/21 01/17/22  Celedonio Savage, MD  sevelamer carbonate (RENVELA) 800 MG tablet Take 2,400 mg by mouth 3 (three) times daily. 02/10/22   [provider]    Inpatient Medications: Scheduled Meds:  [START ON 12/14/2023] Chlorhexidine Gluconate Cloth  6 each Topical Q0600   heparin  5,000 Units Subcutaneous Q8H   sodium chloride flush  10 mL Intravenous Q12H   Continuous Infusions:  [START ON 12/14/2023] azithromycin     [START ON 12/14/2023] cefTRIAXone (ROCEPHIN)  IV     PRN Meds: acetaminophen **OR** acetaminophen  Allergies:    Allergies  Allergen Reactions   Cephalosporins Rash    To both keflex and cefazolin   Tobramycin  Sulfate Swelling    Eye swelling   Vancomycin Swelling    Social History:   Social History   Socioeconomic History   Marital status: Single    Spouse name: Not on file   Number of children: Not on file   Years of education: Not on file   Highest education level: Not on file  Occupational History   Not on file  Tobacco Use   Smoking status: Every Day    Current packs/day: 0.12    Average packs/day: 0.1 packs/day for 15.0 years (1.8 ttl pk-yrs)    Types: Cigarettes    Passive exposure: Current   Smokeless tobacco: Never   Tobacco comments:    4 cigarettes per day  Vaping Use   Vaping status: Never Used  Substance and Sexual Activity   Alcohol use: Yes    Alcohol/week: 0.0 standard drinks of alcohol    Comment: Special Occasional takes Vicar   Drug use: Yes    Types: Marijuana    Comment: ocassional, last time- 12/21/20   Sexual activity: Not Currently    Birth control/protection: None  Other Topics Concern   Not on file  Social History Narrative   Not on file   Social Drivers of Health   Financial Resource Strain: Not on file  Food Insecurity: Patient Declined (12/13/2023)   Hunger Vital Sign    Worried About Running Out of Food in the Last Year: Patient declined    Ran Out of Food in the Last Year: Patient declined  Transportation Needs: Patient Declined (12/13/2023)   PRAPARE - Administrator, Civil Service (Medical): Patient declined    Lack of Transportation (Non-Medical): Patient declined  Physical Activity: Not on file  Stress: Not on file  Social Connections: Not on file  Intimate Partner Violence: Patient Declined (12/13/2023)   Humiliation, Afraid, Rape, and Kick questionnaire    Fear of Current or Ex-Partner: Patient declined    Emotionally Abused: Patient declined    Physically Abused: Patient declined    Sexually Abused: Patient declined    Family History:   No family history on file.   ROS:  Please see the history of present  illness.   All other ROS reviewed and negative.     Physical Exam/Data:   Vitals:   12/13/23 1300 12/13/23 1400 12/13/23 1531 12/13/23 1750  BP: 96/63 90/62 96/63  (!) 92/55  Pulse:   (!) 115   Resp: (!) 23 (!) 24 (!) 26 20  Temp:   97.6 F (36.4 C) 98.3 F (36.8 C)  TempSrc:   Oral Oral  SpO2:   100%   Weight:    55.7 kg  Height:    5\' 3"  (1.6 m)    Intake/Output Summary (Last 24 hours) at 12/13/2023 1826 Last data filed at 12/13/2023 9562 Gross per 24 hour  Intake 250 ml  Output --  Net 250 ml      12/13/2023    5:50 PM 12/13/2023    6:01 AM 12/05/2022    4:42 PM  Last 3 Weights  Weight (lbs) 122 lb 12.7 oz 121 lb 4.1 oz 93 lb 7.6 oz  Weight (kg) 55.7 kg 55 kg 42.4 kg     Body mass index is 21.75 kg/m.  General:  chronically ill female  HEENT: normal Neck: no JVD Vascular: No carotid bruits; Distal pulses 2+ bilaterally Cardiac:  normal S1, S2; RRR;  loud systolic murmur , loud diastolic  murmur  Lungs:  fairly clear  Abd: soft, nontender, no hepatomegaly  Ext: she is covered in stool from her waist down Musculoskeletal:  No deformities, BUE and BLE strength normal and equal Skin: warm and dry  Neuro:  mental status is clearly altered,  she is unable to answer any meaning cardiac history questions. Psych:  altered   EKG:  atrial flutter with RVR   Telemetry:  Telemetry was personally reviewed and demonstrates:    Relevant CV Studies:   Laboratory Data:  High Sensitivity Troponin:  No results for input(s): "TROPONINIHS" in the last 720 hours.   Chemistry Recent Labs  Lab 12/13/23 0610 12/13/23 0918  NA 132* 136  K 3.9 3.7  CL 91*  --   CO2 26  --   GLUCOSE 103*  --   BUN 51*  --   CREATININE 6.55*  --   CALCIUM 10.2  --   GFRNONAA 8*  --   ANIONGAP 15  --     Recent Labs  Lab 12/13/23 0610  PROT 7.6  ALBUMIN 2.4*  AST 26  ALT 27  ALKPHOS 199*  BILITOT 2.7*   Lipids No results for input(s): "CHOL", "TRIG", "HDL", "LABVLDL",  "LDLCALC", "CHOLHDL" in the last 168 hours.  Hematology Recent Labs  Lab 12/13/23 0610 12/13/23 0918  WBC 8.2  --   RBC 4.63  --   HGB 13.8 13.9  HCT 41.2 41.0  MCV 89.0  --   MCH 29.8  --   MCHC 33.5  --   RDW 18.6*  --   PLT 103*  --    Thyroid No results for input(s): "TSH", "FREET4" in the last 168 hours.  BNP Recent Labs  Lab 12/13/23 0610  BNP >4,500.0*    DDimer No results for input(s): "DDIMER" in the last 168 hours.   Radiology/Studies:  DG Chest Port 1 View Result Date: 12/13/2023 CLINICAL DATA:  Evaluate for sepsis. EXAM: PORTABLE CHEST 1 VIEW COMPARISON:  12/01/2022 FINDINGS: Interval removal of endotracheal tube and enteric tube. Stable cardiac enlargement. Aortic atherosclerosis. Persistent diffuse increase interstitial markings. Persistent small left pleural effusion. No new findings. Visualized osseous structures appear intact. IMPRESSION: 1. Interval removal of endotracheal tube and enteric tube. 2. Persistent diffuse increase interstitial markings compatible with pulmonary edema. 3. Persistent small left pleural effusion. Electronically Signed   By: Signa Kell M.D.   On: 12/13/2023 06:32     Assessment and Plan:   Atrial flutter:   she has severe valvular disease and ? Possible sepsis.   It is not surprising that she has developed atrial  flutter.  She has marked left atrial enlargement.  Would continue to treat her underlying sepis. BP is on the low side I don't think she would tolerate Dilt or any antiarrhythmic at this time   At this point, she is not cooperating with the nursing staff ( refuses to be cleaned up )   Continue supportive care.    She is a poor candidate for any invasive cardiac procedures at this point     Risk Assessment/Risk Scores:       CHA2DS2-VASc Score =     This indicates a  % annual risk of stroke. The patient's score is based upon:       For questions or updates, please contact [facility] Please  consult [email or web address] for contact info under    Signed, Kristeen Miss, MD  12/13/2023 6:26 PM

## 2023-12-13 NOTE — ED Notes (Signed)
Renal MD at bedside.

## 2023-12-13 NOTE — ED Notes (Signed)
Walked patient to the bathroom patient is now back in bed on the monitor changed patient sheets placed a brief patient is resting with call bell in reach

## 2023-12-14 ENCOUNTER — Observation Stay (HOSPITAL_COMMUNITY): Payer: Medicaid Other

## 2023-12-14 DIAGNOSIS — G9341 Metabolic encephalopathy: Secondary | ICD-10-CM | POA: Diagnosis not present

## 2023-12-14 DIAGNOSIS — I5022 Chronic systolic (congestive) heart failure: Secondary | ICD-10-CM | POA: Diagnosis present

## 2023-12-14 DIAGNOSIS — R64 Cachexia: Secondary | ICD-10-CM | POA: Diagnosis present

## 2023-12-14 DIAGNOSIS — M3214 Glomerular disease in systemic lupus erythematosus: Secondary | ICD-10-CM | POA: Diagnosis present

## 2023-12-14 DIAGNOSIS — R7881 Bacteremia: Secondary | ICD-10-CM | POA: Diagnosis present

## 2023-12-14 DIAGNOSIS — B961 Klebsiella pneumoniae [K. pneumoniae] as the cause of diseases classified elsewhere: Secondary | ICD-10-CM | POA: Diagnosis present

## 2023-12-14 DIAGNOSIS — D631 Anemia in chronic kidney disease: Secondary | ICD-10-CM | POA: Diagnosis present

## 2023-12-14 DIAGNOSIS — I5021 Acute systolic (congestive) heart failure: Secondary | ICD-10-CM

## 2023-12-14 DIAGNOSIS — A4159 Other Gram-negative sepsis: Secondary | ICD-10-CM | POA: Diagnosis present

## 2023-12-14 DIAGNOSIS — I871 Compression of vein: Secondary | ICD-10-CM | POA: Diagnosis present

## 2023-12-14 DIAGNOSIS — S70321A Blister (nonthermal), right thigh, initial encounter: Secondary | ICD-10-CM | POA: Diagnosis present

## 2023-12-14 DIAGNOSIS — I4892 Unspecified atrial flutter: Secondary | ICD-10-CM | POA: Diagnosis present

## 2023-12-14 DIAGNOSIS — F1410 Cocaine abuse, uncomplicated: Secondary | ICD-10-CM | POA: Diagnosis present

## 2023-12-14 DIAGNOSIS — Z515 Encounter for palliative care: Secondary | ICD-10-CM | POA: Diagnosis not present

## 2023-12-14 DIAGNOSIS — I051 Rheumatic mitral insufficiency: Secondary | ICD-10-CM

## 2023-12-14 DIAGNOSIS — Z992 Dependence on renal dialysis: Secondary | ICD-10-CM | POA: Diagnosis not present

## 2023-12-14 DIAGNOSIS — R188 Other ascites: Secondary | ICD-10-CM | POA: Diagnosis present

## 2023-12-14 DIAGNOSIS — I061 Rheumatic aortic insufficiency: Secondary | ICD-10-CM

## 2023-12-14 DIAGNOSIS — E872 Acidosis, unspecified: Secondary | ICD-10-CM | POA: Diagnosis present

## 2023-12-14 DIAGNOSIS — L989 Disorder of the skin and subcutaneous tissue, unspecified: Secondary | ICD-10-CM | POA: Diagnosis not present

## 2023-12-14 DIAGNOSIS — R6521 Severe sepsis with septic shock: Secondary | ICD-10-CM | POA: Diagnosis present

## 2023-12-14 DIAGNOSIS — I4891 Unspecified atrial fibrillation: Secondary | ICD-10-CM | POA: Diagnosis present

## 2023-12-14 DIAGNOSIS — A419 Sepsis, unspecified organism: Secondary | ICD-10-CM | POA: Diagnosis not present

## 2023-12-14 DIAGNOSIS — Z79899 Other long term (current) drug therapy: Secondary | ICD-10-CM | POA: Diagnosis not present

## 2023-12-14 DIAGNOSIS — I5020 Unspecified systolic (congestive) heart failure: Secondary | ICD-10-CM | POA: Diagnosis not present

## 2023-12-14 DIAGNOSIS — I083 Combined rheumatic disorders of mitral, aortic and tricuspid valves: Secondary | ICD-10-CM | POA: Diagnosis present

## 2023-12-14 DIAGNOSIS — Z881 Allergy status to other antibiotic agents status: Secondary | ICD-10-CM | POA: Diagnosis not present

## 2023-12-14 DIAGNOSIS — I484 Atypical atrial flutter: Secondary | ICD-10-CM | POA: Diagnosis not present

## 2023-12-14 DIAGNOSIS — D696 Thrombocytopenia, unspecified: Secondary | ICD-10-CM | POA: Diagnosis present

## 2023-12-14 DIAGNOSIS — L905 Scar conditions and fibrosis of skin: Secondary | ICD-10-CM | POA: Diagnosis not present

## 2023-12-14 DIAGNOSIS — R0602 Shortness of breath: Secondary | ICD-10-CM | POA: Diagnosis present

## 2023-12-14 DIAGNOSIS — Z1611 Resistance to penicillins: Secondary | ICD-10-CM | POA: Diagnosis present

## 2023-12-14 DIAGNOSIS — B370 Candidal stomatitis: Secondary | ICD-10-CM | POA: Diagnosis present

## 2023-12-14 DIAGNOSIS — R6520 Severe sepsis without septic shock: Secondary | ICD-10-CM | POA: Diagnosis not present

## 2023-12-14 DIAGNOSIS — F17210 Nicotine dependence, cigarettes, uncomplicated: Secondary | ICD-10-CM | POA: Diagnosis present

## 2023-12-14 DIAGNOSIS — N186 End stage renal disease: Secondary | ICD-10-CM | POA: Diagnosis present

## 2023-12-14 HISTORY — DX: Unspecified atrial fibrillation: I48.91

## 2023-12-14 LAB — RENAL FUNCTION PANEL
Albumin: 2.1 g/dL — ABNORMAL LOW (ref 3.5–5.0)
Anion gap: 19 — ABNORMAL HIGH (ref 5–15)
BUN: 60 mg/dL — ABNORMAL HIGH (ref 6–20)
CO2: 19 mmol/L — ABNORMAL LOW (ref 22–32)
Calcium: 9.8 mg/dL (ref 8.9–10.3)
Chloride: 97 mmol/L — ABNORMAL LOW (ref 98–111)
Creatinine, Ser: 7.65 mg/dL — ABNORMAL HIGH (ref 0.44–1.00)
GFR, Estimated: 6 mL/min — ABNORMAL LOW (ref >60)
Glucose, Bld: 76 mg/dL (ref 70–99)
Phosphorus: 7.5 mg/dL — ABNORMAL HIGH (ref 2.5–4.6)
Potassium: 4.5 mmol/L (ref 3.5–5.1)
Sodium: 135 mmol/L (ref 135–145)

## 2023-12-14 LAB — GASTROINTESTINAL PANEL BY PCR, STOOL (REPLACES STOOL CULTURE)

## 2023-12-14 LAB — ECHOCARDIOGRAM COMPLETE
Height: 63 in
S' Lateral: 4.8 cm
Single Plane A4C EF: 19.3 %
Weight: 1964.74 [oz_av]

## 2023-12-14 LAB — CBC
HCT: 38.2 % (ref 36.0–46.0)
Hemoglobin: 12.8 g/dL (ref 12.0–15.0)
MCH: 29.4 pg (ref 26.0–34.0)
MCHC: 33.5 g/dL (ref 30.0–36.0)
MCV: 87.6 fL (ref 80.0–100.0)
Platelets: 99 10*3/uL — ABNORMAL LOW (ref 150–400)
RBC: 4.36 MIL/uL (ref 3.87–5.11)
RDW: 18.3 % — ABNORMAL HIGH (ref 11.5–15.5)
WBC: 9.9 10*3/uL (ref 4.0–10.5)
nRBC: 0.2 % (ref 0.0–0.2)

## 2023-12-14 LAB — CORTISOL-AM, BLOOD: Cortisol - AM: 31.9 ug/dL — ABNORMAL HIGH (ref 6.7–22.6)

## 2023-12-14 LAB — HIV ANTIBODY (ROUTINE TESTING W REFLEX): HIV Screen 4th Generation wRfx: NONREACTIVE

## 2023-12-14 LAB — LACTIC ACID, PLASMA: Lactic Acid, Venous: 3.9 mmol/L (ref 0.5–1.9)

## 2023-12-14 MED ORDER — ACETAMINOPHEN 500 MG PO TABS
1000.0000 mg | ORAL_TABLET | Freq: Three times a day (TID) | ORAL | Status: DC | PRN
  Administered 2023-12-15 – 2023-12-21 (×10): 1000 mg via ORAL
  Filled 2023-12-14 (×11): qty 2

## 2023-12-14 MED ORDER — HEPARIN SODIUM (PORCINE) 1000 UNIT/ML DIALYSIS: 1000.0000 [IU] | INTRAMUSCULAR | Status: DC | PRN

## 2023-12-14 MED ORDER — ALBUMIN HUMAN 25 % IV SOLN
25.0000 g | INTRAVENOUS | Status: AC | PRN
  Administered 2023-12-14 – 2023-12-16 (×2): 25 g via INTRAVENOUS
  Filled 2023-12-14 (×3): qty 100

## 2023-12-14 MED ORDER — LIDOCAINE HCL (PF) 1 % IJ SOLN: 5.0000 mL | INTRAMUSCULAR | Status: DC | PRN

## 2023-12-14 MED ORDER — SODIUM CHLORIDE 0.9 % IV BOLUS
500.0000 mL | Freq: Once | INTRAVENOUS | Status: AC
  Administered 2023-12-14: 500 mL via INTRAVENOUS

## 2023-12-14 MED ORDER — LIDOCAINE-PRILOCAINE 2.5-2.5 % EX CREA: 1.0000 | TOPICAL_CREAM | CUTANEOUS | Status: DC | PRN

## 2023-12-14 MED ORDER — NEPRO/CARBSTEADY PO LIQD: 237.0000 mL | ORAL | Status: DC | PRN

## 2023-12-14 MED ORDER — ALTEPLASE 2 MG IJ SOLR: 2.0000 mg | Freq: Once | INTRAMUSCULAR | Status: DC | PRN

## 2023-12-14 MED ORDER — PENTAFLUOROPROP-TETRAFLUOROETH EX AERO: 1.0000 | INHALATION_SPRAY | CUTANEOUS | Status: DC | PRN

## 2023-12-14 MED ORDER — ANTICOAGULANT SODIUM CITRATE 4% (200MG/5ML) IV SOLN: 5.0000 mL | Status: DC | PRN

## 2023-12-14 NOTE — Progress Notes (Signed)
Patient refused to let me clean her bowel movement in Dialysis Unit.  Said she wanted to take a shower on her floor.  Charge Nurse Shirline Frees was my witness to this patient's statement.  Stacie Glaze [id number]

## 2023-12-14 NOTE — Plan of Care (Signed)
  Problem: Elimination: Goal: Will not experience complications related to bowel motility Outcome: Progressing Goal: Will not experience complications related to urinary retention Outcome: Progressing   Problem: Safety: Goal: Ability to remain free from injury will improve Outcome: Progressing   Problem: Skin Integrity: Goal: Risk for impaired skin integrity will decrease Outcome: Progressing   

## 2023-12-14 NOTE — Progress Notes (Signed)
Summary: [name] is a 42 y.o. female with ESRD on HD T/T/S, lupus and HFrEF (EF 35-40%) who presents with SOB and admitted to the IMTS for sepsis.  Hospital Day: 1   Subjective:  Patient reports she does not feel well this morning. She denies CP, difficulty breathing, vomiting or abdominal pain. She endorses diarrhea. When asked who we could reach out to in case we needed to discuss her medical care, she responded her mom.  Objective:  Vital signs in last 24 hours: Vitals:   12/14/23 0317 12/14/23 0725 12/14/23 1143 12/14/23 1200  BP: 100/61 (!) 85/56  104/67  Pulse: (!) 129 (!) 120  (!) 120  Resp: (!) 27 (!) 23  20  Temp: 98.5 F (36.9 C) 97.9 F (36.6 C)    TempSrc: Oral Oral Oral   SpO2: 100%   100%  Weight:      Height:       Weight change: 0.7 kg  Intake/Output Summary (Last 24 hours) at 12/14/2023 1217 Last data filed at 12/14/2023 1000 Gross per 24 hour  Intake 965.63 ml  Output --  Net 965.63 ml   Physical Exam: General: Awake, cooperative, laying in hospital bed Cardiovascular: Tachycardic, sinus rhythm, no murmurs, rubs or gallops Respiratory: Normal work of breathing  Neurological: Alert      Latest Ref Rng & Units 12/14/2023    2:31 AM 12/13/2023    9:18 AM 12/13/2023    6:10 AM  CBC  WBC 4.0 - 10.5 K/uL 9.9   8.2   Hemoglobin 12.0 - 15.0 g/dL 16.1  09.6  04.5   Hematocrit 36.0 - 46.0 % 38.2  41.0  41.2   Platelets 150 - 400 K/uL 99   103        Latest Ref Rng & Units 12/14/2023    2:31 AM 12/13/2023    9:18 AM 12/13/2023    6:10 AM  CMP  Glucose 70 - 99 mg/dL 76   409   BUN 6 - 20 mg/dL 60   51   Creatinine 8.11 - 1.00 mg/dL 9.14   7.82   Sodium 956 - 145 mmol/L 135  136  132   Potassium 3.5 - 5.1 mmol/L 4.5  3.7  3.9   Chloride 98 - 111 mmol/L 97   91   CO2 22 - 32 mmol/L 19   26   Calcium 8.9 - 10.3 mg/dL 9.8   21.3   Total Protein 6.5 - 8.1 g/dL   7.6   Total Bilirubin <1.2 mg/dL   2.7   Alkaline Phos 38 - 126 U/L   199    AST 15 - 41 U/L   26   ALT 0 - 44 U/L   27     HIV: Non Reactive GI Panel: Unremarkable  AM Cortisol: 31.9 Lactic Acid: 3.7 -> 3.9  Assessment/Plan:  [name] is a 42 y.o. female with ESRD on HD T/T/S, lupus and HFrEF (EF 35-40%) who presents with SOB and admitted to the IMTS for sepsis, on hospital day 1.  Principal Problem:   Sepsis (HCC) Active Problems:   Bacteremia due to Klebsiella pneumoniae   ESRD on dialysis (HCC)   Diarrhea   Thrombocytopenia (HCC)   Atrial fibrillation with RVR (HCC)  #Septic Shock  #Hypotension #Bacteremia 2/2 to Klebsiella pneumoniae Patient continues to be hypotensive, tachycardic and tachypneic overnight. Infectious source identified as blood cultures grew Klebsiella. Patient appears to be in septic shock. Patient started  on azythromycin and ceftriaxone in ED. Azythromycin discontinued as ceftriaxone provides a high percentage of coverage for Klebsiella according to [facility] antibiogram. Susceptibility labs pending. If patient's status worsens, will consider broader coverage while awaiting susceptibility. Lactic acid down trending yesterday. Lactic acid today 3.9 from 3.7 yesterday. WBC continue to be WNL. GI Panel unremarkable, so will discontinue enteric precautions.  Plan: - Continue ceftriaxone 2 g IV at 247mL/hr q 24 hours - 500 mL NS bolus  - BMP, CBC tomorrow AM - Hep B Antibody pending  #Atrial Flutter New onset atrial flutter on admission. Unsure if this was the source of hypotension and SOB or if atrial flutter could have been caused by patient's sepsis. Given patient's klebsiella bacteremia, atrial flutter less likely the cause hypotension and SOB. CTA to r/o PE originally ordered; cancelled given low pre-test probability of PE causing hypotension and SOB in the setting of known bacteremia. Cardiology evaluated patient and recommends continued supportive care. Patient in sinus rhythm on exam today, which is reassuring.  Plan: -  Continue to monitor   #HFrEF EF 35-40% in 2023 with mild aortic stenosis and severe aortic regurgitation. CXR consistent with pulmonary edema. BNP on admission 4,500. Cariology evaluated and ordered repeat echo and advised that the patient is poor candidate for surgical intervention. Plan: - HD planned for today; possible volume removal - Echo   #ESRD on HD T/T/S Last HD session 12/14 - according to her mother they did not take any fluid off at that time because her blood pressure was low per admission team. Nephrology saw patient yesterday. Recommended HD today, holding home coreg and hectorol and continuing sensipar and binders when eating. Plan: - HD planned for today per nephrology  #Substance Abuse On admission, patient's mother reports last cocaine use on 12/12 Plan: - TOC consulted  #Lupus No therapies at this time. AM cortisol labs ordered to assess if adrenal insufficiency may be attributing to patient's hypotension. AM cortisol elevated (31.9), so do not suspect adrenal insufficiency Plan: - Continue to monitor  #Anemia of Chronic Disease Hb 12.8, baseline ~11. Stable Plan: -Continue to monitor  #Thrombocytopenia Platelets down to 99 from 103 yesterday. Admission team notes they were within normal range one year ago. Continue to think this decrease is related to sepsis Plan: - CBC tomorrow AM  Diet: Renal VTE: Heparin IVF: NS  Code: Full  Dispo: Anticipate discharge in 2-4 days.   LOS: 0 days   [name], Medical Student 12/14/2023, 12:17 PM

## 2023-12-14 NOTE — Progress Notes (Signed)
  Echocardiogram 2D Echocardiogram has been performed.  Leda Roys RDCS 12/14/2023, 2:08 PM

## 2023-12-14 NOTE — Progress Notes (Signed)
Pt more alert this morning 3x AO. Conversant and complaint. Still having loose BM. Pending GI panel.  Care ongoing.

## 2023-12-14 NOTE — Progress Notes (Signed)
Rounding Note    Patient Name: [name] Date of Encounter: 12/14/2023  [facility] Cardiologist: Jodelle Red, MD    Subjective   42 yo with chf related to HFrEF and severe valvular disease  ( severe AI, severe MR )   She has been found to have klebsiella bacteremia    Inpatient Medications    Scheduled Meds:  Chlorhexidine Gluconate Cloth  6 each Topical Q0600   heparin  5,000 Units Subcutaneous Q8H   sodium chloride flush  10 mL Intravenous Q12H   Continuous Infusions:  cefTRIAXone (ROCEPHIN)  IV     sodium chloride     PRN Meds: acetaminophen **OR** acetaminophen   Vital Signs    Vitals:   12/14/23 0044 12/14/23 0130 12/14/23 0317 12/14/23 0725  BP: (!) 99/58  100/61 (!) 85/56  Pulse: (!) 130 (!) 126 (!) 129 (!) 120  Resp: (!) 37 (!) 29 (!) 27 (!) 30  Temp: 98 F (36.7 C)  98.5 F (36.9 C) 97.9 F (36.6 C)  TempSrc: Oral  Oral Oral  SpO2:   100%   Weight:      Height:        Intake/Output Summary (Last 24 hours) at 12/14/2023 0842 Last data filed at 12/14/2023 0729 Gross per 24 hour  Intake 360 ml  Output --  Net 360 ml      12/13/2023    5:50 PM 12/13/2023    6:01 AM 12/05/2022    4:42 PM  Last 3 Weights  Weight (lbs) 122 lb 12.7 oz 121 lb 4.1 oz 93 lb 7.6 oz  Weight (kg) 55.7 kg 55 kg 42.4 kg      Telemetry    Atrial flutter with RVR  - Personally Reviewed  ECG     - Personally Reviewed  Physical Exam   QIH:KVQQV female,   chronically ill   Neck: No JVD Cardiac: RRR, tachy,  systolic and diastolic murmur Respiratory: Clear to auscultation bilaterally. GI: Soft, nontender, non-distended  MS: No edema; No deformity. Neuro:  Nonfocal  Psych: Normal affect   Labs    High Sensitivity Troponin:  No results for input(s): "TROPONINIHS" in the last 720 hours.   Chemistry Recent Labs  Lab 12/13/23 0610 12/13/23 0918 12/14/23 0231  NA 132* 136 135  K 3.9 3.7 4.5  CL 91*  --  97*  CO2 26  --  19*   GLUCOSE 103*  --  76  BUN 51*  --  60*  CREATININE 6.55*  --  7.65*  CALCIUM 10.2  --  9.8  PROT 7.6  --   --   ALBUMIN 2.4*  --  2.1*  AST 26  --   --   ALT 27  --   --   ALKPHOS 199*  --   --   BILITOT 2.7*  --   --   GFRNONAA 8*  --  6*  ANIONGAP 15  --  19*    Lipids No results for input(s): "CHOL", "TRIG", "HDL", "LABVLDL", "LDLCALC", "CHOLHDL" in the last 168 hours.  Hematology Recent Labs  Lab 12/13/23 0610 12/13/23 0918 12/14/23 0231  WBC 8.2  --  9.9  RBC 4.63  --  4.36  HGB 13.8 13.9 12.8  HCT 41.2 41.0 38.2  MCV 89.0  --  87.6  MCH 29.8  --  29.4  MCHC 33.5  --  33.5  RDW 18.6*  --  18.3*  PLT 103*  --  99*  Thyroid No results for input(s): "TSH", "FREET4" in the last 168 hours.  BNP Recent Labs  Lab 12/13/23 0610  BNP >4,500.0*    DDimer No results for input(s): "DDIMER" in the last 168 hours.   Radiology    DG Chest Port 1 View Result Date: 12/13/2023 CLINICAL DATA:  Evaluate for sepsis. EXAM: PORTABLE CHEST 1 VIEW COMPARISON:  12/01/2022 FINDINGS: Interval removal of endotracheal tube and enteric tube. Stable cardiac enlargement. Aortic atherosclerosis. Persistent diffuse increase interstitial markings. Persistent small left pleural effusion. No new findings. Visualized osseous structures appear intact. IMPRESSION: 1. Interval removal of endotracheal tube and enteric tube. 2. Persistent diffuse increase interstitial markings compatible with pulmonary edema. 3. Persistent small left pleural effusion. Electronically Signed   By: Signa Kell M.D.   On: 12/13/2023 06:32    Cardiac Studies      Patient Profile     42 y.o. female with heart failure due to HFrEF as well as severe aortic insufficiency  and mitral regurgitation .  Severe LAE   Assessment & Plan     Atrial flutter:   like related to her CHF and severe LAE . Now exacerbated by Klebsiella bacteremia / sepsis .  Continue supportive care     2.  HFrEF :  echo from Dec. 2023 shows  EF 35-40% with moderately reduced RV function  Severe LAE  MV appears rheumatic, moderate MS with severe MR TV - moderate - severe TR  AV - thickened mild -mod AS with severe AI   Repeat echo has been ordered   She is a poor candidate for surgical intervention .             For questions or updates, please contact [facility] Please consult [email or web address] for contact info under        Signed, Kristeen Miss, MD  12/14/2023, 8:42 AM

## 2023-12-14 NOTE — Progress Notes (Signed)
[facility] Progress Note  Subjective: seen in room, no c/o, feeling better  Vitals:   12/14/23 1600 12/14/23 1630 12/14/23 1637 12/14/23 1700  BP: (!) 84/68 (!) 71/59 (!) 90/59 102/63  Pulse: (!) 123 (!) 115  (!) 108  Resp: (!) 37 (!) 27  (!) 26  Temp:      TempSrc:      SpO2: 99% 100%  96%  Weight:      Height:        Exam: Gen thin adult female, looks much better today Sclera anicteric, throat clear  No jvd or bruits Chest clear bilat to bases RRR no RG Abd soft ntnd no mass or ascites +bs Ext no LEedema Neuro is Ox3, nonfocal    RUA AVF+bruit     Renal-related home meds: - coreg 12.5 qd - sensipar 30 hs - renavite - revela 3 ac tid      [facility]  3h  400/1.5   45kg  2/2 bath AVF  Heparin none - last OP HD 12/14, post wt 45.7kg - making it to her dry wt, occ under - mircera 30 q 4, last 11/25, due 12/23  - hectorol 6       Assessment/ Plan: Sepsis - got IVF"s in ED and IV abx. Looks much better today. Per pmd.   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 Last HD Sat. HD today.  BP - BP's soft, holdly home coreg.  Volume - euvolemic on exam. CXR IS pattern which could be edema vs baseline. On RA, lungs clear, no edema. Get stand wt when able. Will be hard to get any fluid off today w/ soft bp's.  Anemia of eskd - Hb here 13 - last 3 outpt Hb's were 11.8, 12.1 and 12.9 on 12/12. Close to baseline.  MBD ckd - CCa sig elevated ~ 11.4, hold hectorol for now. Cont sensipar and binders when eating.  H/o SLE       Vinson Moselle MD  [facility] 12/14/2023, 5:07 PM  Recent Labs  Lab 12/13/23 0610 12/13/23 0918 12/14/23 0231  HGB 13.8 13.9 12.8  ALBUMIN 2.4*  --  2.1*  CALCIUM 10.2  --  9.8  PHOS  --   --  7.5*  CREATININE 6.55*  --  7.65*  K 3.9 3.7 4.5   No results for input(s): "IRON", "TIBC", "FERRITIN" in the last 168 hours. Inpatient medications:  Chlorhexidine Gluconate Cloth  6 each Topical Q0600   heparin  5,000 Units Subcutaneous Q8H   sodium  chloride flush  10 mL Intravenous Q12H    albumin human Stopped (12/14/23 1555)   cefTRIAXone (ROCEPHIN)  IV Stopped (12/14/23 0930)   acetaminophen, albumin human

## 2023-12-14 NOTE — Progress Notes (Signed)
Received patient in bed to unit.  Alert and oriented.  Informed consent signed and in chart.   TX duration:3 hours and 15 minutes  Patient moved around a lot and per Dialysis protocol, I put a soft restraint on right arm in order to not have patient pull needles out.  Soft restraint taken off at end of session Transported back to the room  Alert, without acute distress.  Hand-off given to patient's nurse.   Access used: Right Upper Arm Fistula Access issues: none  Total UF removed: -100 Medication(s) given: Albumin, 500cc NS bolus   12/14/23 1838  Vitals  Temp (!) 97.5 F (36.4 C)  Temp Source Oral  BP 94/60  Pulse Rate (!) 116  ECG Heart Rate (!) 115  Resp (!) 36  Oxygen Therapy  SpO2 93 %  O2 Device Room Air  During Treatment Monitoring  HD Safety Checks Performed Yes  Intra-Hemodialysis Comments Tx completed  Dialysis Fluid Bolus Normal Saline  Bolus Amount (mL) 300 mL  Post Treatment  Dialyzer Clearance Lightly streaked  Liters Processed 70.3  Fluid Removed (mL) -100 mL  Tolerated HD Treatment No (Comment)  Post-Hemodialysis Comments see note  Fistula / Graft Right Upper arm Arteriovenous fistula  Placement Date/Time: (c) 08/01/20 4098   Placed prior to admission: No  Orientation: Right  Access Location: Upper arm  Access Type: (c) Arteriovenous fistula  Status Deaccessed     Stacie Glaze LPN [facility]

## 2023-12-15 ENCOUNTER — Encounter (HOSPITAL_COMMUNITY)
Admission: EM | Disposition: A | Discharge status: Skilled Nursing Facility | Payer: Self-pay | Source: Home / Self Care | Attending: Internal Medicine

## 2023-12-15 ENCOUNTER — Inpatient Hospital Stay (HOSPITAL_COMMUNITY): Payer: Medicaid Other

## 2023-12-15 ENCOUNTER — Encounter (HOSPITAL_COMMUNITY): Payer: Self-pay | Admitting: Nephrology

## 2023-12-15 ENCOUNTER — Ambulatory Visit (HOSPITAL_COMMUNITY): Admission: RE | Admit: 2023-12-15 | Payer: Medicaid Other | Source: Home / Self Care | Admitting: Nephrology

## 2023-12-15 DIAGNOSIS — I051 Rheumatic mitral insufficiency: Secondary | ICD-10-CM | POA: Diagnosis not present

## 2023-12-15 DIAGNOSIS — I5020 Unspecified systolic (congestive) heart failure: Secondary | ICD-10-CM | POA: Diagnosis not present

## 2023-12-15 DIAGNOSIS — A419 Sepsis, unspecified organism: Secondary | ICD-10-CM | POA: Diagnosis not present

## 2023-12-15 DIAGNOSIS — G9341 Metabolic encephalopathy: Secondary | ICD-10-CM | POA: Diagnosis not present

## 2023-12-15 DIAGNOSIS — I061 Rheumatic aortic insufficiency: Secondary | ICD-10-CM | POA: Diagnosis not present

## 2023-12-15 DIAGNOSIS — R6520 Severe sepsis without septic shock: Secondary | ICD-10-CM | POA: Diagnosis not present

## 2023-12-15 HISTORY — PX: PERIPHERAL VASCULAR BALLOON ANGIOPLASTY: CATH118281

## 2023-12-15 HISTORY — PX: A/V FISTULAGRAM: CATH118298

## 2023-12-15 LAB — CBC
HCT: 36.8 % (ref 36.0–46.0)
Hemoglobin: 12.4 g/dL (ref 12.0–15.0)
MCH: 29.5 pg (ref 26.0–34.0)
MCHC: 33.7 g/dL (ref 30.0–36.0)
MCV: 87.6 fL (ref 80.0–100.0)
Platelets: 83 10*3/uL — ABNORMAL LOW (ref 150–400)
RBC: 4.2 MIL/uL (ref 3.87–5.11)
RDW: 18.7 % — ABNORMAL HIGH (ref 11.5–15.5)
WBC: 6.5 10*3/uL (ref 4.0–10.5)
nRBC: 0 % (ref 0.0–0.2)

## 2023-12-15 LAB — BASIC METABOLIC PANEL
Anion gap: 13 (ref 5–15)
BUN: 44 mg/dL — ABNORMAL HIGH (ref 6–20)
CO2: 23 mmol/L (ref 22–32)
Calcium: 9.8 mg/dL (ref 8.9–10.3)
Chloride: 97 mmol/L — ABNORMAL LOW (ref 98–111)
Creatinine, Ser: 5.31 mg/dL — ABNORMAL HIGH (ref 0.44–1.00)
GFR, Estimated: 10 mL/min — ABNORMAL LOW (ref >60)
Glucose, Bld: 120 mg/dL — ABNORMAL HIGH (ref 70–99)
Potassium: 4.8 mmol/L (ref 3.5–5.1)
Sodium: 133 mmol/L — ABNORMAL LOW (ref 135–145)

## 2023-12-15 LAB — CULTURE, BLOOD (ROUTINE X 2)

## 2023-12-15 LAB — PHOSPHORUS: Phosphorus: 5.5 mg/dL — ABNORMAL HIGH (ref 2.5–4.6)

## 2023-12-15 LAB — C DIFFICILE QUICK SCREEN W PCR REFLEX
C Diff antigen: NEGATIVE
C Diff interpretation: NOT DETECTED
C Diff toxin: NEGATIVE

## 2023-12-15 LAB — SEDIMENTATION RATE: Sed Rate: 24 mm/h — ABNORMAL HIGH (ref 0–22)

## 2023-12-15 LAB — HEPATITIS B SURFACE ANTIBODY, QUANTITATIVE: Hep B S AB Quant (Post): 138 m[IU]/mL

## 2023-12-15 LAB — C-REACTIVE PROTEIN: CRP: 17.7 mg/dL — ABNORMAL HIGH (ref <1.0)

## 2023-12-15 SURGERY — A/V FISTULAGRAM
Anesthesia: LOCAL

## 2023-12-15 MED ORDER — LOPERAMIDE HCL 2 MG PO CAPS
4.0000 mg | ORAL_CAPSULE | Freq: Once | ORAL | Status: AC
  Administered 2023-12-15: 4 mg via ORAL
  Filled 2023-12-15: qty 2

## 2023-12-15 MED ORDER — IOHEXOL 350 MG/ML SOLN
50.0000 mL | Freq: Once | INTRAVENOUS | Status: AC | PRN
  Administered 2023-12-15: 50 mL via INTRAVENOUS

## 2023-12-15 MED ORDER — LIDOCAINE HCL (PF) 1 % IJ SOLN
INTRAMUSCULAR | Status: DC | PRN
  Administered 2023-12-15: 2 mL

## 2023-12-15 MED ORDER — LIDOCAINE HCL (PF) 1 % IJ SOLN
INTRAMUSCULAR | Status: AC
  Filled 2023-12-15: qty 30

## 2023-12-15 MED ORDER — HYDROMORPHONE HCL 2 MG PO TABS
1.0000 mg | ORAL_TABLET | Freq: Once | ORAL | Status: AC
  Administered 2023-12-15: 1 mg via ORAL
  Filled 2023-12-15: qty 1

## 2023-12-15 MED ORDER — FENTANYL CITRATE (PF) 100 MCG/2ML IJ SOLN
INTRAMUSCULAR | Status: AC
  Filled 2023-12-15: qty 2

## 2023-12-15 MED ORDER — CHLORHEXIDINE GLUCONATE CLOTH 2 % EX PADS
6.0000 | MEDICATED_PAD | Freq: Every day | CUTANEOUS | Status: DC
Start: 2023-12-16 — End: 2023-12-17
  Administered 2023-12-17: 6 via TOPICAL

## 2023-12-15 MED ORDER — FENTANYL CITRATE (PF) 100 MCG/2ML IJ SOLN
INTRAMUSCULAR | Status: DC | PRN
  Administered 2023-12-15 (×2): 25 ug via INTRAVENOUS

## 2023-12-15 MED ORDER — LACTATED RINGERS IV BOLUS
500.0000 mL | Freq: Once | INTRAVENOUS | Status: AC
  Administered 2023-12-15: 500 mL via INTRAVENOUS

## 2023-12-15 MED ORDER — CIPROFLOXACIN HCL 500 MG PO TABS
500.0000 mg | ORAL_TABLET | Freq: Every day | ORAL | Status: DC
  Administered 2023-12-16 – 2023-12-20 (×5): 500 mg via ORAL
  Filled 2023-12-15 (×5): qty 1

## 2023-12-15 MED ORDER — IODIXANOL 320 MG/ML IV SOLN
INTRAVENOUS | Status: DC | PRN
  Administered 2023-12-15: 15 mL

## 2023-12-15 SURGICAL SUPPLY — 15 items
BAG SNAP BAND KOVER 36X36 (MISCELLANEOUS) ×3 IMPLANT
BALLN ATHLETIS 10X40X75 (BALLOONS) ×2
BALLN MUSTANG 8.0X40 75 (BALLOONS) ×2
BALLOON ATHLETIS 10X40X75 (BALLOONS) IMPLANT
BALLOON MUSTANG 8.0X40 75 (BALLOONS) IMPLANT
CATH SLIP KMP 65CM 5FR (CATHETERS) IMPLANT
COVER DOME SNAP 22 D (MISCELLANEOUS) ×3 IMPLANT
GUIDEWIRE ANGLED .035X150CM (WIRE) IMPLANT
GUIDEWIRE ANGLED .035X260CM (WIRE) IMPLANT
SHEATH PINNACLE R/O II 6F 4CM (SHEATH) IMPLANT
SHEATH PINNACLE R/O II 7F 4CM (SHEATH) IMPLANT
SYR MEDALLION 10ML (SYRINGE) IMPLANT
TRAY PV CATH (CUSTOM PROCEDURE TRAY) ×3 IMPLANT
WIRE ROSEN-J .035X260CM (WIRE) IMPLANT
WIRE STARTER BENTSON 035X150 (WIRE) IMPLANT

## 2023-12-15 NOTE — Progress Notes (Signed)
[facility] Progress Note  Subjective: seen in room, no c/o, feeling better  Vitals:   12/15/23 1013 12/15/23 1018 12/15/23 1023 12/15/23 1321  BP: 95/61 100/63 94/70 116/63  Pulse: (!) 117 (!) 117 (!) 115   Resp: (!) 21 (!) 28 (!) 21   Temp:      TempSrc:      SpO2:  90%    Weight:      Height:        Exam: Gen thin adult female, [place] O2 Sclera anicteric, throat clear  No jvd or bruits Chest clear bilat to bases RRR no RG Abd soft ntnd no mass or ascites +bs Ext no LEedema Neuro is Ox3, nonfocal    RUA AVF+bruit     Renal-related home meds: - coreg 12.5 qd - sensipar 30 hs - renavite - revela 3 ac tid      [facility]  3h  400/1.5   45kg  2/2 bath AVF  Heparin none - last OP HD 12/14, post wt 45.7kg - making it to her dry wt, occ under - mircera 30 q 4, last 11/25, due 12/23  - hectorol 6       Assessment/ Plan: Klebsiella bacteremia - getting IV abx  HFrEF - per cards consult, LVEF 30-35% and severe AI / TR and moderate MR. Not a candidate for surgical repair.  Atrial flutter - per cards. BP too low to add rate control meds Fistulogram - R BBT AVF placed 2021, decreased access flows. 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 HD tomorrow.  BP - BP's soft, holding home coreg.  Volume - euvolemic on exam. CXR shows sig CM, mild vasc congestion. On RA, lungs clear, no edema. Get stand wt when able. Up 9kg by wts, not edematous on exam except the R upper leg. Max UF 2.-2.5 L given low bp's.  Anemia of eskd - Hb here 13 - last 3 outpt Hb's were 11.8, 12.1 and 12.9 on 12/12. Close to baseline.  MBD ckd - CCa sig elevated ~ 11.4, hold hectorol for now. Cont sensipar and binders when eating.  H/o SLE       Vinson Moselle MD  [facility] 12/15/2023, 3:15 PM  Recent Labs  Lab 12/13/23 0610 12/13/23 0918  12/14/23 0231 12/15/23 0821  HGB 13.8   < > 12.8 12.4  ALBUMIN 2.4*  --  2.1*  --   CALCIUM 10.2  --  9.8 9.8  PHOS  --   --  7.5* 5.5*  CREATININE 6.55*  --  7.65* 5.31*  K 3.9   < > 4.5 4.8   < > = values in this interval not displayed.   No results for input(s): "IRON", "TIBC", "FERRITIN" in the last 168 hours. Inpatient medications:  Chlorhexidine Gluconate Cloth  6 each Topical Q0600   [START ON 12/16/2023] ciprofloxacin  500 mg Oral Daily   heparin  5,000 Units Subcutaneous Q8H   sodium chloride flush  10 mL Intravenous Q12H    albumin human Stopped (12/14/23 1555)   acetaminophen, albumin human

## 2023-12-15 NOTE — H&P (Addendum)
Interval H&P  The patient has presented today for an angiogram/ angioplasty; patient is followed at [facility] by Dr. Valentino Nose.  Various methods of treatment have been discussed with the patient.  After consideration of risk, benefits and other options for treatment, the patient has consented to a angiogram/ angioplasty with  possible stent placement.   Risks of angiogram with potential angioplasty and stenting if needed.contrast reaction, extravasation/ bleeding, dissection, hypotension and death were explained to the patient.  The patient's history has been reviewed and the patient has been examined, no changes in status.  Stable for angiogram/angioplasty  I have reviewed the patient's chart and labs.  Questions were answered to the patient's satisfaction.  Patient is scheduled for outpatient angiogram but actually was admitted on 12/13/2023 with hypotension, tachycardia and respiratory distress found to be in new onset atrial flutter with RVR.  Patient has sepsis workup initiated in the emergency department, empiric treatment with ceftriaxone and azithromycin, diarrhea.  She was initially somnolent but had symptomatic improvement improvement over the following 24 hours.  She does have a history of HFrEF EF is 35 to 40% in 2023.  Patient is persistently tachycardic, blood pressure is on the lower side, white count is 9.9 without any bowel movements yet this morning.  Patient did receive a dialysis treatment on 12/17 through her right upper arm brachial basilic fistula.  Last her right brachiobasilic fistula was placed on August 01, 2020 and her last procedure was an outflow 10 mm angioplasty on February 15, 2023.

## 2023-12-15 NOTE — Op Note (Signed)
Patient presents for concerns of decreased access flows in her right BBT (placed August 01, 2020).  Her last procedure was a outflow 10 mm angioplasty on February 15, 2023.   On the physical exam, the fistula is not hyperpulsatile but her blood pressure is on the lower side with systolics in the 90s to low 100s.  Bruit is weak also.    Summary:  1)      The patient had successful angioplasty (10 mm Athletis  FE ~20 atm) of significant stenosis in the outflow basilic vein swing site.  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  Fortunately we were able to cross the lesion and treated with a 8 mm Mustang followed by a 10 mm flat is fully effaced approximately 16 to 18 atm of pressure with 30% residual stenosis but no evidence of extravasation.  Flows improved after outflow angioplasty.  Inflow not studied as the patient's blood pressure was on the lower side during this entire procedure but she insisted on having pain medications otherwise was refusing any angioplasty.  We do not want her to thrombose given that she will end up with a tunneled catheter and she already has Klebsiella bacteremia. 3)      This right BBT remains amenable to future percutaneous intervention as long as it remains patent at least 3 months.  Description of procedure: The arm was prepped and draped in the usual sterile fashion. The right upper arm brachial basilic fistula was cannulated (16109) with an 18G Angiocath needle directed in an antegrade direction in arterial limb of the fistula. A guidewire was inserted and exchanged for a 7 Fr sheath. Contrast [phone]) injection via the side port of the sheath was performed. The angiogram of the fistula (09811) showed a focal 70-80% stenosis in the outflow basilic swing site; the axillary vein, centrals, cannulation zone; inflow anastomosis not studied given she was hypotensive during the entire procedure and insisted on pain  medications otherwise she was going to refuse any angioplasty.  Flows were sluggish through the fistula.  Of note there is a 100% innominate vein occlusion with retrograde flow up the jugular vein.  A 0.035 hydrophilic wire was then inserted through the sheath and parked in the central veins changed over a 260 cm length Rosen and guidewire advanced with a guiding catheter into the IVC.  A 10 mm Athletis angioplasty balloon was then inserted over the guidewire and ositioned at the basilic vein outflow swing site stenosis.   Venous angioplasty (91478) was carried out to 18 ATM with FULL effacement of the waist on the balloon at the basilic outflow swing site lesion. The repeat angiogram showed 10% residual stenosis at the outflow basilic swing site  with no evidence of extravasation or dissection.  This point to the severe waist on the 10 mm Athletis, we changed over to 8 mm Mustang angioplasty balloon and advanced it over the guidewire to the level of the innominate vein occlusion.  Full effacement was achieved to perform central venous angioplasty (29562)  at approximately 10 to 14 atm of pressure.  Afterwards there was no evidence of extravasation but there was still 50% residual stenosis.  At this point we changed over to a 10 mm Athletis and advanced it to the level of the innominate vein stenosis and full effacement was achieved at approximately 14 atm of pressure.  Venogram revealed 30% residual stenosis, much more rapid flows  with no evidence of extravasation or dissection.  Hemostasis: A 3-0 ethilon purse string suture was placed at the cannulation site on removal of the sheath.  Sedation: 0 mg Versed, 25 + 25 mcg Fentanyl.  Contrast. 15 mL  Monitoring: Because of the patient's comorbid conditions and sedation during the procedure, continuous EKG monitoring and O2 saturation monitoring was performed throughout the procedure by the RN. There were no abnormal arrhythmias  encountered.  Complications: None  Diagnoses: I87.1 Stricture of vein  N18.6 ESRD T82.858A Stricture of access  Procedure Coding:  [phone] Cannulation and angiogram of fistula, venous angioplasty (basilic vein outflow swing site)  [phone] Central venous angioplasty (innominate vein is defined by CMS to be in the central veins and in a different circuit than the dialysis access) V2536 Contrast  Recommendations:  1. Continue to cannulate the fistula with 15G needles.  2. Refer for problems with flows/swelling. 3. Remove the suture next treatment.   Discharge: The patient was discharged home in stable condition. The patient was given education regarding the care of the dialysis access AVF and specific instructions in case of any problems.

## 2023-12-15 NOTE — Discharge Instructions (Addendum)
[name], [name] were hospitalized for an infection in your blood and a very fast heart rate. At this time you are medically stable to go to a nursing facility. Thank you for allowing [name] to be part of your care.   Please arrange a hospital follow up appointment with your primary doctor when you leave the nursing facility.   Please note these changes made to your medications:   *Please START taking:  Eliquis 2.5mg  twice daily Loperamide as needed for diarrhea Midodrine 5mg  three times daily Nystatin oral swish  *Please STOP taking:  Carvedilol Keppra Zofran Protonix Hectorol  Please make sure to return to the hospital if you have worsening fever, shortness of breath, chest pain, or weakness.    General care instructions: - Do not drive or operate heavy machinery for 24hrs - Avoid making any important decisions for the remainder of the day. - You should be able to eat, drink, and resume your normal medications. - Avoid any strenuous activity for the remainder of the day. Potential complications: - Your hand is more cold or numb than usual. - You are bleeding at the site and it will not stop with direct pressure. If it was a declot expect some oozing at the site. Avoid extreme pressure to the site. - You have a change in the bruit and /or thrill in your fistula or graft. - You have a fever, swelling, see redness or feel heat at or near the puncture site. Medication instructions: - Continue routine medications unless otherwise instructed. 4. Please have your sutures removed at your next scheduled dialysis treatment.  Information on my medicine - ELIQUIS (apixaban)  Why was Eliquis prescribed for you? Eliquis was prescribed for you to reduce the risk of a blood clot forming that can cause a stroke if you have a medical condition called atrial fibrillation (a type of irregular heartbeat).  What do You need to know about Eliquis ? Take your Eliquis TWICE DAILY - one tablet in  the morning and one tablet in the evening with or without food. If you have difficulty swallowing the tablet whole please discuss with your pharmacist how to take the medication safely.  Take Eliquis exactly as prescribed by your doctor and DO NOT stop taking Eliquis without talking to the doctor who prescribed the medication.  Stopping may increase your risk of developing a stroke.  Refill your prescription before you run out.  After discharge, you should have regular check-up appointments with your healthcare provider that is prescribing your Eliquis.  In the future your dose may need to be changed if your kidney function or weight changes by a significant amount or as you get older.  What do you do if you miss a dose? If you miss a dose, take it as soon as you remember on the same day and resume taking twice daily.  Do not take more than one dose of ELIQUIS at the same time to make up a missed dose.  Important Safety Information A possible side effect of Eliquis is bleeding. You should call your healthcare provider right away if you experience any of the following: Bleeding from an injury or your nose that does not stop. Unusual colored urine (red or dark brown) or unusual colored stools (red or black). Unusual bruising for unknown reasons. A serious fall or if you hit your head (even if there is no bleeding).  Some medicines may interact with Eliquis and might increase your risk of bleeding or  clotting while on Eliquis. To help avoid this, consult your healthcare provider or pharmacist prior to using any new prescription or non-prescription medications, including herbals, vitamins, non-steroidal anti-inflammatory drugs (NSAIDs) and supplements.  This website has more information on Eliquis (apixaban): http://www.eliquis.com/eliquis/home

## 2023-12-15 NOTE — Progress Notes (Addendum)
Summary: [name] is a 42 y.o. female with ESRD on HD T/T/S, lupus and HFrEF (EF 35-40%) who presents with SOB and admitted to the IMTS for sepsis.  Hospital Day: 2  Subjective:  Overnight Events: Nursing reports she had diarrhea overnight. In addition, nursing noted she has a blister on her leg.   On exam, patient reports she is not feeling well. She denies abdominal pain and endorses continued diarrhea. Patient mentioned she had diarrhea for about a day before hospital admission but it has worsened since admission. Dr. [name] called patient's mother to update her on patient's status. Patient's mother confirmed patient had some diarrhea prior to admission as well.   Additionally, patient reports she has a new blister on her R thigh. She said it started about 2-3 days ago as a flat lesion and has grown in size and turned into a blister. She is unsure if it is painful as she has not touched the lesion. She denies ever having something like this before with her hx of lupus. Denies tampon use. She denies recent injury or burn.   Objective:  Vital signs in last 24 hours: Vitals:   12/15/23 1013 12/15/23 1018 12/15/23 1023 12/15/23 1321  BP: 95/61 100/63 94/70 116/63  Pulse: (!) 117 (!) 117 (!) 115   Resp: (!) 21 (!) 28 (!) 21   Temp:      TempSrc:      SpO2:  90%    Weight:      Height:       Weight change: -1.9 kg  Intake/Output Summary (Last 24 hours) at 12/15/2023 1446 Last data filed at 12/14/2023 2300 Gross per 24 hour  Intake 43 ml  Output -100 ml  Net 143 ml   Physical Exam: General: NAD, cooperative, able to participate in exam Cardiovascular: Tachycardic, regular rhythm, no murmurs, rubs or gallops Respiratory: Tachypneic but otherwise unlabored-appearing breathing  Integumentary: 2 fluid-filled bullae totaling about 2.5 inches in length; no tenderness to palpation around the lesion  Extremities: No lower extremity edema        Latest Ref Rng &  Units 12/15/2023    8:21 AM 12/14/2023    2:31 AM 12/13/2023    9:18 AM  CBC  WBC 4.0 - 10.5 K/uL 6.5  9.9    Hemoglobin 12.0 - 15.0 g/dL 84.1  32.4  40.1   Hematocrit 36.0 - 46.0 % 36.8  38.2  41.0   Platelets 150 - 400 K/uL 83  99         Latest Ref Rng & Units 12/15/2023    8:21 AM 12/14/2023    2:31 AM 12/13/2023    9:18 AM  CMP  Glucose 70 - 99 mg/dL 027  76    BUN 6 - 20 mg/dL 44  60    Creatinine 2.53 - 1.00 mg/dL 6.64  4.03    Sodium 474 - 145 mmol/L 133  135  136   Potassium 3.5 - 5.1 mmol/L 4.8  4.5  3.7   Chloride 98 - 111 mmol/L 97  97    CO2 22 - 32 mmol/L 23  19    Calcium 8.9 - 10.3 mg/dL 9.8  9.8      C Diff Antigen: Negative C Diff Interpretation: No C. Diff detected C Diff toxin: Negative   Phosphorus: 7.5 -> 5.5  Hep B S Ab Quant: 138.0  CRP: 17.7 Sed Rate: 24  Susceptibility Panel  Klebsiella pneumoniae (ZZ00) Klebsiella pneumoniae (ZZ01)  KIRBY BAUER MIC    AMPICILLIN   RESISTANT Resistant    AMPICILLIN/SULBACTAM   4 SENSITIVE Sensitive    CEFAZOLIN SENSITIVE Sensitive      CEFEPIME   <=0.12 SENS... Sensitive    CEFTAZIDIME   <=1 SENSITIVE Sensitive    CEFTRIAXONE   <=0.25 SENS... Sensitive    CIPROFLOXACIN   <=0.25 SENS... Sensitive    GENTAMICIN   <=1 SENSITIVE Sensitive    IMIPENEM   <=0.25 SENS... Sensitive    PIP/TAZO   <=4 SENSITI... Sensitive    TRIMETH/SULFA   <=20 SENSIT... Sensitive    Echo Impression: 1. Left ventricular ejection fraction, by estimation, is 30 to 35%. The  left ventricle has moderately decreased function. The left ventricle demonstrates global hypokinesis. The left ventricular internal cavity size  was mildly dilated. Left ventricular diastolic parameters are indeterminate.   2. Right ventricular systolic function is moderately reduced. The right ventricular size is mildly enlarged. There is normal pulmonary artery systolic pressure. The estimated right ventricular systolic pressure is 35.0 mmHg.   3. Left  atrial size was severely dilated.   4. Right atrial size was moderately dilated.   5. Thickened and calcified mitral chords, severely calcified leaflets. The mitral valve is rheumatic. Moderate mitral valve regurgitation. Mild mitral stenosis. The mean mitral valve gradient is 4.0 mmHg. Moderate mitral annular calcification.   6. Tricuspid valve regurgitation is moderate to severe. Visually moderate TR but hepatic vein systolic flow reversal suggests possible severe TR   7. The aortic valve is tricuspid. There is moderate calcification of the aortic valve. Aortic valve regurgitation is moderate to severe, highly eccentric. Aortic valve sclerosis/calcification is present, without any evidence of aortic stenosis.   8. The inferior vena cava is dilated in size with >50% respiratory variability, suggesting right atrial pressure of 8 mmHg.   A/V Fistulagram  Peripheral Vascular Balloon Angioplasty Op Note Summary: 1) The patient had successful angioplasty (10 mm Athletis  FE ~20 atm) of significant stenosis in the outflow basilic vein swing site.  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  Fortunately we were able to cross the lesion and treated with a 8 mm Mustang followed by a 10 mm flat is fully effaced approximately 16 to 18 atm of pressure with 30% residual stenosis but no evidence of extravasation.  Flows improved after outflow angioplasty.  Inflow not studied as the patient's blood pressure was on the lower side during this entire procedure but she insisted on having pain medications otherwise was refusing any angioplasty.  We do not want her to thrombose given that she will end up with a tunneled catheter and she already has Klebsiella bacteremia. 3) This right BBT remains amenable to future percutaneous intervention as long as it remains patent at least 3 months.  Assessment/Plan: ELAINE RATTERREE is a 42 y.o. 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   Principal Problem:   Bacteremia Active Problems:   Lupus (systemic lupus erythematosus) (HCC)   ESRD on dialysis (HCC)   Diarrhea   Chronic HFrEF (heart failure with reduced ejection fraction) (HCC)   Septic shock (HCC)   Thrombocytopenia (HCC)  #Septic shock due to Klebsiella bacteremia Stable but still sick with tachycardia and episodes of hypotension and tachypnea. Per chart review, patient appears  to have hx of leukopenia possibly related to lupus. WBC WNL during admission, but could be elevated from her baseline given bacteremia. Susceptibility panel revealed sensitivities to antibiotics except amoxicillin. Since the patient has had 48 hours of IV antibiotics and has clinically improved, will transition to oral ciprofloxacin.  Plan: - Discontinue ceftriaxone 2 g IV at 26mL/hr q 24 hours - Start ciprofloxacin 500 mg daily  - LR Bolus  - CBC, BMP tomorrow AM  #Atrial Flutter New onset atrial flutter on admission. Resolved yesterday. Cardiology evaluated patient and recommends continued supportive care. Patient in sinus rhythm on exam today, which continues to be reassuring. May need to consider anticoagulants in the future with new onset of atrial flutter during this admission.  Plan: - Telemetry - Continue to monitor    #HFrEF EF 35-40% in 2023 with mild aortic stenosis and severe aortic regurgitation. CXR on admission consistent with pulmonary edema. BNP on admission 4,500. Cariology evaluated, ordered repeat echo and advised that the patient is poor candidate for surgical intervention. Echo revealed the finding above.  Plan: - Continue to monitor volume status    #ESRD on HD T/T/S Patient completed HD yesterday. Nephrology following. Recommends continuing to hold coreg and hectorol and continue sensipar and binders when  eating. Trending phosphorus levels to monitor for refeeding syndrome. Phosphorus 5.5 today, decreased from 7.5 yesterday. Plan: - Nephrology following, appreciate recommendations    #Substance Abuse On admission, patient's mother reports last cocaine use on 12/12. Plan: - TOC consulted   #SLE #Lupus Nephritis Per chart review, patient followed by Dr. Kellie Simmering previously for SLE but has not followed up recently. In 2014, documentation stated she had not seen a Rheumatologist in over 5 years. 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   AM cortisol elevated (31.9) yesterday, so do not suspect adrenal insufficiency attributing to hypotension. Complement levels, Sed Rate and CRP ordered to assess inflammatory markers and possible lupus flare. CRP elevated at 17.7. Sed Rate elevated at 24. Plan: - C4  - C3 - Total complement   #Anemia of Chronic Disease Hb decreased to 12.4 from 12.8 yesterday and 13.8 on admission. Baseline reported by admission team of ~11. Stable. Suspect decrease may be due to sepsis or possible lupus flare.  Plan: - CBC tomorrow AM   #Thrombocytopenia Platelets decreased to 83 from 99 yesterday and 103 on admission. Admission team notes they were within normal range one year ago. Could be sepsis related or lupus flare. Will continue to monitor and trend.  Plan: - CBC tomorrow AM  #R Leg Bullous Lesion New lesion presented to the team today. Appears bullous, possibly blood filled. Differential diagnosis includes autoimmune process from underlying SLE, drug reaction, toxic shock syndrome or possible burn or injury prior to hospitalization. Patient denies a burn or injury prior to hospitalization. Patient denies tampon use, so TSS less likely. Patient denies history of cutaneous  manifestations of prior SLE flares; however, given her bacteremia/sepsis, her body is in an inflammatory state. Drug reaction possible as patient has an allergy to cephalosporins, specifically Keflex and cefazolin, noted in her chart with a rash reaction. Per chart review, pharmacy was consulted in the ED for this admission, and the patient was started on ceftriaxone given patient has a history of tolerating cephalosporins in the past. Additional chart review noted in 2022 patient experienced head swelling and  blistering of her lips after receiving vancomycin and cefazolin with HD where patient had reported she tolerated vancomycin in the past. Given possibility of drug reaction to ceftriaxone, will discontinue ceftriaxone and start ciprofloxacin.  Plan: - Discontinue ceftriaxone - Start ciprofloxacin - C4  - C3 - Total complement - Sed Rate - CRP  #Diarrhea  Patient reports diarrhea about 1 day prior to admission with worsening diarrhea on admission. Per patient's mother, she had some diarrhea before admission as well. Obtained GI panel and C Diff labs; both resulted unremarkable. Patient continues to have no abdominal pain. Possible ischemic colitis, so CT abdomen and pelvis ordered  Plan: - CT A/P - Imodium 4 mg once for diarrhea   #A/V Fistula Per Dr. Paulene Floor with Nephrology, there were concerns of decreased access flows in her right BBT (placed August 01, 2020). A/V Fistulagram and Peripheral Vascular Balloon Angioplasty performed today. OP note summary above.  Plan:  - Continue to monitor  Diet: Renal VTE: Heparin IVF: LR Bolus  Code: Full   Dispo: Anticipate discharge in 2-3 days.   LOS: 1 day   Bubba Hales, Medical Student 12/15/2023, 2:46 PM

## 2023-12-15 NOTE — TOC Initial Note (Addendum)
Transition of Care [facility]) - Initial/Assessment Note    Patient Details  Name: [name] MRN: [id number] Date of Birth: 10-10-1981  Transition of Care [facility]) CM/SW Contact:    Marliss Coots, LCSW Phone Number: 12/15/2023, 3:36 PM  Clinical Narrative:  3:37 PM CSW attempted to offer Substance Use resources to patient per [facility] Consult. Bedside RN informed CSW that patient is not currently fully oriented. TOC will continue to follow.  Transition of Care Asessment: Insurance and Status: Insurance coverage has been reviewed Patient has primary care physician: Yes Home environment has been reviewed: Resides with mother   Prior/Current Home Services: No current home services Social Drivers of Health Review: SDOH reviewed needs interventions Readmission risk has been reviewed: Yes Transition of care needs: transition of care needs identified, TOC will continue to follow    Activities of Daily Living   ADL Screening (condition at time of admission) Independently performs ADLs?: Yes (appropriate for developmental age) Is the patient deaf or have difficulty hearing?: No Does the patient have difficulty seeing, even when wearing glasses/contacts?: No Does the patient have difficulty concentrating, remembering, or making decisions?: No   Admission diagnosis:  Shortness of breath [R06.02] Lactic acidosis [E87.20] Sepsis (HCC) [A41.9] New onset atrial flutter (HCC) [I48.92] Patient Active Problem List   Diagnosis Date Noted   Thrombocytopenia (HCC) 12/14/2023   Septic shock (HCC) 12/13/2023   Acute on chronic heart failure (HCC) 12/06/2022   Seizures (HCC) 12/04/2022   R Subdural hematoma (HCC) 12/04/2022   Chronic HFrEF (heart failure with reduced ejection fraction) (HCC) 11/30/2022   Nonrheumatic aortic valve insufficiency 11/30/2022   Nonrheumatic mitral valve regurgitation 11/30/2022   Non-ischemic cardiomyopathy (HCC) 11/30/2022   Acute febrile illness 04/12/2022    Hypertensive urgency 04/12/2022   GERD (gastroesophageal reflux disease) 04/12/2022   HFrEF (heart failure with reduced ejection fraction) (HCC) 04/12/2022   Elevated troponin 04/12/2022   SIRS (systemic inflammatory response syndrome) (HCC) 04/12/2022   Hyperkalemia, diminished renal excretion 12/15/2021   Trichomonas vaginitis 03/25/2021   Bacterial vaginitis 03/25/2021   Encounter for screening for COVID-19 01/24/2021   Hemodialysis access, fistula mature (HCC) 01/22/2021   Allergic reaction 01/22/2021   Acute respiratory failure with hypoxia (HCC) 11/02/2020   Acute hyperkalemia 07/28/2020   Nicotine dependence, cigarettes, uncomplicated 07/28/2020   Essential hypertension 07/28/2020   Polysubstance abuse (HCC) 07/28/2020   Hemodialysis catheter malfunction (HCC) 07/28/2020   Complication of AV dialysis fistula, sequela 07/28/2020   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HCC) 05/18/2020   Bacterial intestinal infection, unspecified 12/14/2019   Left shoulder pain 08/15/2019   Cellulitis, unspecified 05/02/2019   Hyperkalemia 07/29/2018   SVT (supraventricular tachycardia) (HCC) 07/16/2018   Cardiomyopathy, unspecified (HCC) 12/03/2016   Diarrhea 04/18/2016   Chronic combined systolic (congestive) and diastolic (congestive) heart failure (HCC) 03/16/2016   Other hypervolemia    S/P thoracentesis    Cough with hemoptysis    Myalgia    Pulmonary edema    Dependence on renal dialysis (HCC) 10/15/2015   Rash and nonspecific skin eruption 06/26/2015   Secondary Raynaud's phenomenon 06/24/2015   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   Fever, unspecified 08/28/2014   Pruritus, unspecified 08/28/2014   Headache, unspecified 04/30/2014   Pain, unspecified 04/30/2014   Cramping of hands 04/19/2014   Contraceptive management 04/19/2014   Mild protein-calorie malnutrition (HCC) 03/22/2014   Insomnia 03/15/2014   Benign hypertension with ESRD (end-stage renal disease) (HCC) 03/15/2014   Tobacco abuse  02/15/2014   Healthcare maintenance 02/15/2014   Iron deficiency anemia,  unspecified 02/12/2014   Coagulation defect, unspecified (HCC) 02/10/2014   Anemia in chronic kidney disease 02/09/2014   Secondary hyperparathyroidism of renal origin (HCC) 02/09/2014   Hypoalbuminemia 02/01/2014   Nephrotic syndrome 02/01/2014   ESRD on dialysis (HCC) 01/31/2014   Pleural effusion, left 01/31/2014   Microcytic anemia 01/29/2014   Hypocalcemia 01/29/2014   Bacteremia 01/23/2013   Marijuana smoker (HCC) 01/18/2013   H/O pericarditis 01/17/2013   Lupus (systemic lupus erythematosus) (HCC) 01/17/2013   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   S/P pericardiocentesis 01/17/2013   H/O pleural effusion 01/17/2013   Nephrosis 01/17/2013   Preseptal cellulitis 01/17/2013   Cardiac tamponade 12/29/2004   Pericardial effusion (noninflammatory) 12/29/2004   PCP:  [name], NP Pharmacy:   [facility] [phone] - [place] Otto, [place] - [address] AT [facility] [address] [place] [address] Phone: [phone] Fax: [phone]     Social Drivers of Health (SDOH) Social History: SDOH Screenings   Food Insecurity: Patient Declined (12/13/2023)  Housing: Patient Declined (12/13/2023)  Transportation Needs: Patient Declined (12/13/2023)  Utilities: Patient Declined (12/13/2023)  Depression (PHQ2-9): Low Risk  (04/29/2022)  Tobacco Use: High Risk (12/13/2023)   SDOH Interventions:     Readmission Risk Interventions    04/17/2022    8:36 AM  Readmission Risk Prevention Plan  Transportation Screening Complete  PCP or Specialist Appt within 3-5 Days Complete  HRI or Home Care Consult Complete  Social Work Consult for Recovery Care Planning/Counseling Complete  Palliative Care Screening Not Applicable  Medication Review Oceanographer) Complete

## 2023-12-15 NOTE — Progress Notes (Signed)
Heart Failure Navigator Progress Note  Assessed for Heart & Vascular TOC clinic readiness.  Patient does not meet criteria due to ESRD on hemodialysis.   Navigator will sign off at this time.    Dawn Fields, BSN, RN Heart Failure Nurse Navigator Secure Chat Only   

## 2023-12-15 NOTE — Progress Notes (Signed)
Rounding Note    Patient Name: RHYSE BILES Date of Encounter: 12/15/2023  [name] Cardiologist: Jodelle Red, MD    Subjective    42 yo with HFrEF , with concomitant severe valvular disease - EF 30-35%,  Moderate - Severe AI. 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  - Moderate MR , mild MS  Moderate - severe TR with hepatic vein reversal of flow suggestive of severe TR   She is frail , cachectic      Inpatient Medications    Scheduled Meds:  Chlorhexidine Gluconate Cloth  6 each Topical Q0600   [START ON 12/16/2023] ciprofloxacin  500 mg Oral Daily   heparin  5,000 Units Subcutaneous Q8H   loperamide  4 mg Oral Once   sodium chloride flush  10 mL Intravenous Q12H   Continuous Infusions:  albumin human Stopped (12/14/23 1555)   PRN Meds: acetaminophen, albumin human   Vital Signs    Vitals:   12/15/23 1013 12/15/23 1018 12/15/23 1023 12/15/23 1321  BP: 95/61 100/63 94/70 116/63  Pulse: (!) 117 (!) 117 (!) 115   Resp: (!) 21 (!) 28 (!) 21   Temp:      TempSrc:      SpO2:  90%    Weight:      Height:        Intake/Output Summary (Last 24 hours) at 12/15/2023 1409 Last data filed at 12/14/2023 2300 Gross per 24 hour  Intake 43 ml  Output -100 ml  Net 143 ml      12/14/2023    6:50 PM 12/14/2023    2:44 PM 12/13/2023    5:50 PM  Last 3 Weights  Weight (lbs) 119 lb 11.4 oz 118 lb 9.7 oz 122 lb 12.7 oz  Weight (kg) 54.3 kg 53.8 kg 55.7 kg      Telemetry    Atrial flutter with RVR  - Personally Reviewed  ECG     - Personally Reviewed  Physical Exam   GEN: frail, cachectic   female,  more awake today  Neck: No JVD Cardiac: RRR, tachycardic,  loud systolic and diastolic murmur  Respiratory: Clear to auscultation bilaterally. GI: Soft, nontender, non-distended  MS: No edema; No deformity. Neuro:  Nonfocal  Psych: Normal affect   Labs    High Sensitivity Troponin:  No results for input(s): "TROPONINIHS" in the last 720 hours.    Chemistry Recent Labs  Lab 12/13/23 0610 12/13/23 0918 12/14/23 0231 12/15/23 0821  NA 132* 136 135 133*  K 3.9 3.7 4.5 4.8  CL 91*  --  97* 97*  CO2 26  --  19* 23  GLUCOSE 103*  --  76 120*  BUN 51*  --  60* 44*  CREATININE 6.55*  --  7.65* 5.31*  CALCIUM 10.2  --  9.8 9.8  PROT 7.6  --   --   --   ALBUMIN 2.4*  --  2.1*  --   AST 26  --   --   --   ALT 27  --   --   --   ALKPHOS 199*  --   --   --   BILITOT 2.7*  --   --   --   GFRNONAA 8*  --  6* 10*  ANIONGAP 15  --  19* 13    Lipids No results for input(s): "CHOL", "TRIG", "HDL", "LABVLDL", "LDLCALC", "CHOLHDL" in the last 168 hours.  Hematology Recent Labs  Lab 12/13/23 [phone]  12/13/23 0918 12/14/23 0231 12/15/23 0821  WBC 8.2  --  9.9 6.5  RBC 4.63  --  4.36 4.20  HGB 13.8 13.9 12.8 12.4  HCT 41.2 41.0 38.2 36.8  MCV 89.0  --  87.6 87.6  MCH 29.8  --  29.4 29.5  MCHC 33.5  --  33.5 33.7  RDW 18.6*  --  18.3* 18.7*  PLT 103*  --  99* 83*   Thyroid No results for input(s): "TSH", "FREET4" in the last 168 hours.  BNP Recent Labs  Lab 12/13/23 0610  BNP >4,500.0*    DDimer No results for input(s): "DDIMER" in the last 168 hours.   Radiology    ECHOCARDIOGRAM COMPLETE Result Date: 12/14/2023    ECHOCARDIOGRAM REPORT   Patient Name:   TAJANA MUNDLE Date of Exam: 12/14/2023 Medical Rec #:  [id number]      Height:       63.0 in Accession #:    [id number]     Weight:       122.8 lb Date of Birth:  [birth year]     BSA:          1.572 m Patient Age:    42 years       BP:           85/56 mmHg Patient Gender: F              HR:           112 bpm. Exam Location:  Inpatient Procedure: 2D Echo, Color Doppler and Cardiac Doppler Indications:    CHF I50.21  History:        Patient has prior history of Echocardiogram examinations, most                 recent 11/30/2022. CHF. Polysubstance abuse, Lupus.  Sonographer:    Harriette Bouillon RDCS Referring Phys: Derwood Kaplan ATWAY IMPRESSIONS  1. Left ventricular ejection fraction, by  estimation, is 30 to 35%. The left ventricle has moderately decreased function. The left ventricle demonstrates global hypokinesis. The left ventricular internal cavity size was mildly dilated. Left ventricular diastolic parameters are indeterminate.  2. Right ventricular systolic function is moderately reduced. The right ventricular size is mildly enlarged. There is normal pulmonary artery systolic pressure. The estimated right ventricular systolic pressure is 35.0 mmHg.  3. Left atrial size was severely dilated.  4. Right atrial size was moderately dilated.  5. Thickened and calcified mitral chords, severely calcified leaflets. The mitral valve is rheumatic. Moderate mitral valve regurgitation. Mild mitral stenosis. The mean mitral valve gradient is 4.0 mmHg. Moderate mitral annular calcification.  6. Tricuspid valve regurgitation is moderate to severe. Visually moderate TR but hepatic vein systolic flow reversal suggests possible severe TR  7. The aortic valve is tricuspid. There is moderate calcification of the aortic valve. Aortic valve regurgitation is moderate to severe, highly eccentric. Aortic valve sclerosis/calcification is present, without any evidence of aortic stenosis.  8. The inferior vena cava is dilated in size with >50% respiratory variability, suggesting right atrial pressure of 8 mmHg. FINDINGS  Left Ventricle: Left ventricular ejection fraction, by estimation, is 30 to 35%. The left ventricle has moderately decreased function. The left ventricle demonstrates global hypokinesis. The left ventricular internal cavity size was mildly dilated. There is no left ventricular hypertrophy. Left ventricular diastolic parameters are indeterminate. Right Ventricle: The right ventricular size is mildly enlarged. No increase in right ventricular wall thickness. Right ventricular systolic function is moderately reduced. There  is normal pulmonary artery systolic pressure. The tricuspid regurgitant velocity is  2.60 m/s, and with an assumed right atrial pressure of 8 mmHg, the estimated right ventricular systolic pressure is 35.0 mmHg. Left Atrium: Left atrial size was severely dilated. Right Atrium: Right atrial size was moderately dilated. Pericardium: There is no evidence of pericardial effusion. Mitral Valve: Thickened and calcified mitral chords, severely calcified leaflets. The mitral valve is rheumatic. There is severe calcification of the mitral valve leaflet(s). Moderate mitral annular calcification. Moderate mitral valve regurgitation. Mild mitral valve stenosis. The mean mitral valve gradient is 4.0 mmHg. Tricuspid Valve: The tricuspid valve is normal in structure. Tricuspid valve regurgitation is moderate to severe. The flow in the hepatic veins is reversed during ventricular systole. Aortic Valve: The aortic valve is tricuspid. There is moderate calcification of the aortic valve. Aortic valve regurgitation is moderate to severe. Aortic valve sclerosis/calcification is present, without any evidence of aortic stenosis. Pulmonic Valve: The pulmonic valve was normal in structure. Pulmonic valve regurgitation is trivial. Aorta: The aortic root is normal in size and structure. Venous: The inferior vena cava is dilated in size with greater than 50% respiratory variability, suggesting right atrial pressure of 8 mmHg. IAS/Shunts: No atrial level shunt detected by color flow Doppler.  LEFT VENTRICLE PLAX 2D LVIDd:         5.70 cm LVIDs:         4.80 cm LV PW:         1.00 cm LV IVS:        1.00 cm LVOT diam:     2.00 cm LV SV:         47 LV SV Index:   30 LVOT Area:     3.14 cm  LV Volumes (MOD) LV vol d, MOD A4C: 176.0 ml LV vol s, MOD A4C: 142.0 ml LV SV MOD A4C:     176.0 ml RIGHT VENTRICLE            IVC RV S prime:     8.70 cm/s  IVC diam: 2.00 cm TAPSE (M-mode): 0.8 cm LEFT ATRIUM              Index        RIGHT ATRIUM           Index LA diam:        4.40 cm  2.80 cm/m   RA Area:     26.50 cm LA Vol (A2C):    134.0 ml 85.26 ml/m  RA Volume:   98.40 ml  62.61 ml/m LA Vol (A4C):   120.0 ml 76.36 ml/m LA Biplane Vol: 133.0 ml 84.63 ml/m  AORTIC VALVE LVOT Vmax:   100.00 cm/s LVOT Vmean:  67.000 cm/s LVOT VTI:    0.149 m  AORTA Ao Root diam: 3.00 cm Ao Asc diam:  2.80 cm MITRAL VALVE           TRICUSPID VALVE MV Mean grad: 4.0 mmHg TR Peak grad:   27.0 mmHg                        TR Vmax:        260.00 cm/s                         SHUNTS                        Systemic VTI:  0.15 m                        Systemic Diam: 2.00 cm Dalton McleanMD Electronically signed by Wilfred Lacy Signature Date/Time: 12/14/2023/2:18:46 PM    Final     Cardiac Studies    2  Patient Profile     42 y.o. female with severe HFrEF and severe rheumatic valvular disease, ESRD.  Assessment & Plan     HFrEF:    In the setting of moderte -severe valvular disease   Unfortunately , she is not a candidate for surgical repair of her valvular issues We have discussed this issue .  Will need volume managed by dialysis .  2.  Atrial flutter:  she has RVR .  With her severe LAE and valvular disease, I do not think cardioversion would be useful. BP is too low to add rate controlling medications  3.  Recent Klebsiella sepsis:   on ABx   At this point, we do not have anything further to add  If she becomes symptomatic, I think a hospice consultation would be appropriate.   Call for questions    [facility] will sign off.   Medication Recommendations:  continue supportive care, comfort care  Other recommendations (labs, testing, etc):   Follow up as an outpatient:  with Dr. Cristal Deer as needed.   For questions or updates, please contact [facility] Please consult [email or web address] for contact info under        Signed, Kristeen Miss, MD  12/15/2023, 2:09 PM

## 2023-12-15 NOTE — Plan of Care (Signed)
  Problem: Clinical Measurements: Goal: Diagnostic test results will improve Outcome: Progressing Goal: Respiratory complications will improve Outcome: Progressing   Problem: Pain Management: Goal: General experience of comfort will improve Outcome: Progressing   Problem: Safety: Goal: Ability to remain free from injury will improve Outcome: Progressing

## 2023-12-16 DIAGNOSIS — R7881 Bacteremia: Secondary | ICD-10-CM

## 2023-12-16 DIAGNOSIS — L989 Disorder of the skin and subcutaneous tissue, unspecified: Secondary | ICD-10-CM

## 2023-12-16 LAB — CBC
HCT: 36.9 % (ref 36.0–46.0)
Hemoglobin: 12.3 g/dL (ref 12.0–15.0)
MCH: 29.6 pg (ref 26.0–34.0)
MCHC: 33.3 g/dL (ref 30.0–36.0)
MCV: 88.9 fL (ref 80.0–100.0)
Platelets: 71 10*3/uL — ABNORMAL LOW (ref 150–400)
RBC: 4.15 MIL/uL (ref 3.87–5.11)
RDW: 18.9 % — ABNORMAL HIGH (ref 11.5–15.5)
WBC: 6.5 10*3/uL (ref 4.0–10.5)
nRBC: 0 % (ref 0.0–0.2)

## 2023-12-16 LAB — BASIC METABOLIC PANEL
Anion gap: 12 (ref 5–15)
BUN: 51 mg/dL — ABNORMAL HIGH (ref 6–20)
CO2: 26 mmol/L (ref 22–32)
Calcium: 10.4 mg/dL — ABNORMAL HIGH (ref 8.9–10.3)
Chloride: 96 mmol/L — ABNORMAL LOW (ref 98–111)
Creatinine, Ser: 6.27 mg/dL — ABNORMAL HIGH (ref 0.44–1.00)
GFR, Estimated: 8 mL/min — ABNORMAL LOW (ref >60)
Glucose, Bld: 120 mg/dL — ABNORMAL HIGH (ref 70–99)
Potassium: 3.7 mmol/L (ref 3.5–5.1)
Sodium: 134 mmol/L — ABNORMAL LOW (ref 135–145)

## 2023-12-16 LAB — C4 COMPLEMENT: Complement C4, Body Fluid: 21 mg/dL (ref 12–38)

## 2023-12-16 LAB — MAGNESIUM: Magnesium: 2.6 mg/dL — ABNORMAL HIGH (ref 1.7–2.4)

## 2023-12-16 LAB — C3 COMPLEMENT: C3 Complement: 51 mg/dL — ABNORMAL LOW (ref 82–167)

## 2023-12-16 LAB — DIC (DISSEMINATED INTRAVASCULAR COAGULATION)PANEL
D-Dimer, Quant: 3.85 ug{FEU}/mL — ABNORMAL HIGH (ref 0.00–0.50)
Fibrinogen: 449 mg/dL (ref 210–475)
INR: 1.4 — ABNORMAL HIGH (ref 0.8–1.2)
Platelets: 61 10*3/uL — ABNORMAL LOW (ref 150–400)
Prothrombin Time: 17.4 s — ABNORMAL HIGH (ref 11.4–15.2)
Smear Review: NONE SEEN
aPTT: 36 s (ref 24–36)

## 2023-12-16 LAB — PHOSPHORUS: Phosphorus: 6.2 mg/dL — ABNORMAL HIGH (ref 2.5–4.6)

## 2023-12-16 MED ORDER — ALBUMIN HUMAN 25 % IV SOLN
25.0000 g | Freq: Two times a day (BID) | INTRAVENOUS | Status: DC | PRN
Start: 2023-12-16 — End: 2023-12-22
  Administered 2023-12-16: 25 g via INTRAVENOUS

## 2023-12-16 MED ORDER — HYDROMORPHONE HCL 2 MG PO TABS
1.0000 mg | ORAL_TABLET | Freq: Four times a day (QID) | ORAL | Status: DC | PRN
  Administered 2023-12-16 – 2023-12-21 (×6): 1 mg via ORAL
  Filled 2023-12-16 (×8): qty 1

## 2023-12-16 MED ORDER — SODIUM CHLORIDE 0.9 % IV SOLN
1000.0000 mg | INTRAVENOUS | Status: AC
  Administered 2023-12-16 – 2023-12-18 (×3): 1000 mg via INTRAVENOUS
  Filled 2023-12-16 (×4): qty 16

## 2023-12-16 MED ORDER — MIDODRINE HCL 5 MG PO TABS
5.0000 mg | ORAL_TABLET | Freq: Three times a day (TID) | ORAL | Status: DC
  Administered 2023-12-16 – 2023-12-22 (×17): 5 mg via ORAL
  Filled 2023-12-16 (×18): qty 1

## 2023-12-16 NOTE — Progress Notes (Signed)
   12/16/23 1315  Vitals  Temp 98.1 F (36.7 C)  Pulse Rate (!) 112  Resp (!) 25  BP (!) 106/56  O2 Device Nasal Cannula  Oxygen Therapy  O2 Flow Rate (L/min) 4 L/min  Patient Activity (if Appropriate) In chair  Pulse Oximetry Type Continuous  Post Treatment  Dialyzer Clearance Lightly streaked  Liters Processed 66.8  Fluid Removed (mL) -100 mL  Tolerated HD Treatment No (Comment) (Patient hypotensive pre and intra HD. Albumin 25% 25grams given times 2.)  AVG/AVF Arterial Site Held (minutes) 10 minutes  AVG/AVF Venous Site Held (minutes) 10 minutes   Received patient in bed to unit.  Alert and oriented.  Informed consent signed and in chart.   TX duration:3.5hrs  Patient tolerated well.  Transported back to the room  Alert, without acute distress.  Hand-off given to patient's nurse.   Access used: yes Access issues: no  Total UF removed: - Medication(s) given: Albumin 25% 25 grams x 2    Laqueta Due, RN [facility]

## 2023-12-16 NOTE — Progress Notes (Signed)
Summary: [name]  is a 42 y.o. female with ESRD on HD T/T/S, lupus and HFrEF (EF 35-40%) who presents with SOB, diarrhea and A flutter and admitted to the IMTS for sepsis secondary to klebsiella bacteremia.   Hospital Day: 3  Subjective:  Overnight Events: Nursing notes family members were at bedside yesterday evening and stated that the lesion on her leg was present before hospitalization, starting as a bruise and then transitioning to a blister.   Patient seen and evaluated while resting in bed. Patient reports she still feels ill. She denies difficulty breathing or CP and endorses pain in her R leg. Denies blistering of lips or mouth ulcers. Reports diarrhea is better than yesterday. Patient reports the lesion started this past Sunday prior to her admission once she talked with her mom.   Later in the afternoon, Dr. Toney Rakes and I spoke with patient's daughter and mother were present at bedside. They were updated on patient's health status and prognosis. When asking about the lesion, they reports she fell in the shower and developed a bruise on the inner R thigh. They report the blister is new since admission. All questions were answered.  Objective:  Vital signs in last 24 hours: Vitals:   12/16/23 1242 12/16/23 1304 12/16/23 1315 12/16/23 1436  BP: 107/66 106/65 (!) 106/56   Pulse: (!) 115 (!) 106 (!) 112   Resp: (!) 32 (!) 37 (!) 25   Temp:   98.1 F (36.7 C)   TempSrc:      SpO2:      Weight:    (S) 55 kg  Height:       Weight change:   Intake/Output Summary (Last 24 hours) at 12/16/2023 1510 Last data filed at 12/16/2023 1315 Gross per 24 hour  Intake 240 ml  Output -100 ml  Net 340 ml   Physical Exam General: Tired-appearing, cooperative Mouth: No noticeable blisters on lips Respiratory: Normal work of breathing Integumentary: ~3.5 inches fluid-filled bullae larger than yesterday; appears to blood-filled, uncertain with patient's darker skin  tone Extremities: No lower extremity edema      Latest Ref Rng & Units 12/16/2023    2:09 AM 12/15/2023    8:21 AM 12/14/2023    2:31 AM  CBC  WBC 4.0 - 10.5 K/uL 6.5  6.5  9.9   Hemoglobin 12.0 - 15.0 g/dL 95.6  21.3  08.6   Hematocrit 36.0 - 46.0 % 36.9  36.8  38.2   Platelets 150 - 400 K/uL 71  83  99       Latest Ref Rng & Units 12/16/2023    2:09 AM 12/15/2023    8:21 AM 12/14/2023    2:31 AM  CMP  Glucose 70 - 99 mg/dL 578  469  76   BUN 6 - 20 mg/dL 51  44  60   Creatinine 0.44 - 1.00 mg/dL 6.29  5.28  4.13   Sodium 135 - 145 mmol/L 134  133  135   Potassium 3.5 - 5.1 mmol/L 3.7  4.8  4.5   Chloride 98 - 111 mmol/L 96  97  97   CO2 22 - 32 mmol/L 26  23  19    Calcium 8.9 - 10.3 mg/dL 24.4  9.8  9.8    C3 Complement: 51 C4 Complement: 21  CT A/P IMPRESSION: - Moderate abdominopelvic ascites.  - Small bilateral pleural effusions. Associated bilateral lower lobe opacities, left greater right, atelectasis versus pneumonia.  -  No colonic wall thickening/inflammatory changes CT.  Assessment/Plan:  Principal Problem:   Bacteremia Active Problems:   Lupus (systemic lupus erythematosus) (HCC)   ESRD on dialysis (HCC)   Diarrhea   Chronic HFrEF (heart failure with reduced ejection fraction) (HCC)   Septic shock (HCC)   Thrombocytopenia (HCC)  [name] is a 42 y.o. female with ESRD on HD T/T/S, lupus and HFrEF (EF 35-40%) who presents with SOB, diarrhea and A flutter and admitted to the IMTS for sepsis secondary to klebsiella bacteremia, on hospital day 3.   #Septic Shock due to Klebsiella bacteremia  Stable but still sick. Continue ciprofloxacin. Goals of care conversation started yesterday. Will need to continue discussion. Patient's daughter at bedside last night; left before we were able to speak to her. The team spoke with patient's mother and daughter and updated them on her status and prognosis today. - Continue ciprofloxacin - CBC, BMP tomorrow  AM  #Atrial Flutter New onset atrial flutter on admission. Thought to be resolved; however, after discussing with cardiology, they think she has been in atrial flutter throughout admission.  - Continue to monitor  #HFrEF EF 35-40% in 2023 with mild aortic stenosis and severe aortic regurgitation. CXR on admission consistent with pulmonary edema. BNP on admission 4,500. Echo yesterday revealed EF of 30-35%, LV and RV decreased function. Dilation of all four chambers. Significant valvular disease. Cardiology consulted and advised that she is not a candidate for surgery and recommended hospice consultation if she becomes symptomatic.  - Continue to monitor  #ESRD on HD T/T/S Last HD 12/17. Nephrology following. Recommends HD today, continuing to hold coreg and hectorol and continue censipar and binders when eating. Also recommends starting midodrine for hypotension. - HD today  #Substance Abuse On admission, patient's mother reports last cocaine use on 12/12. TOC attempted to see patient yesterday but was unable to given patient was not fully oriented.   #SLE #Lupus Nephritis Per chart review, patient followed by Dr. Kellie Simmering previously for SLE but has not followed up recently. In 2014, documentation stated she had not seen a Rheumatologist in over 5 years. 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   Inflammatory markers elevated yesterday. C3 low, C4 WNL. Concerned she is in an active lupus flare given her hx of SLE and lupus nephritis, new bullous lesion, down-trending WBC, Hgb and platelets. Lupus labs ordered. Plan to start high-dose steroids.  - Start solumedrol 1000 mg IV at 66 mL/hr q24 hours x3 doses  - Lupus labs pending  #Anemia of Chronic Disease Hb decreased to 12.3 from 12.4 yesterday and 13.8 on  admission. Baseline reported by admission team of ~11. Stable. Suspect decrease may be due to sepsis or possible lupus flare.   #Thrombocytopenia Platelets decreased to 77 from 83 yesterday and 103 on admission. Admission team notes they were within normal range one year ago. Could be sepsis related, lupus flare or HIT. Do not suspect HIT; 4T-Score reveals low probability of HIT.   - CBC tomorrow AM  #R Leg Bullous Lesion Lesion appears worse today as it is larger on exam and appears to be more painful for patient. Less likely drug reaction as family member and patient state this was present before admission and lesion has worsened since stopping ceftriaxone. Talked with family at bedside today; unclear if the current blister is the same lesion  as prior to admission. Possible lupus flare from underlying SLE and inflammatory process. Will assess lupus labs. According to Up-To-Date, it appears there may be an association between bullous SLE and active lupus nephritis. We are also considering DIC given lesion appears to be filled with blood. If DIC panel returns negative, will plan to biopsy lesion tomorrow morning. - STAT DIC panel ordered  #Diarrhea Improved. CT A/P revealed no colonic wall thickening/inflammatory changes. One dose of imodium given yesterday. Patient reports diarrhea is better.   #Ascites  #Bilateral pleural effusions Ascites, small bilateral pleural effusions and associated bilateral lower lobe opacities, left greater right, atelectasis versus pneumonia present on CT. Last CT A/P in 2017 showed prominent abdominal and pelvic ascites with diffuse edema throughout the subcutaneous fat, bilateral pleural effusions with lower lung zone consolidation atelectasis as well as patchy infiltration suggesting pneumonia or edema. - Will continue to monitor.  Diet: Renal VTE: Heparin IVF: None Code: Full   Dispo: Anticipate discharge in 2-3 days.   LOS: 2 days   Bubba Hales,  Medical Student 12/16/2023, 3:10 PM

## 2023-12-16 NOTE — Progress Notes (Signed)
[facility] Progress Note  Subjective: seen in dialysis. BP's remains soft in the 80s during HD today, some in the 100s. Only able to get 100 cc off.   Vitals:   12/16/23 1304 12/16/23 1315 12/16/23 1436 12/16/23 1500  BP: 106/65 (!) 106/56  115/63  Pulse: (!) 106 (!) 112    Resp: (!) 37 (!) 25  (!) 21  Temp:  98.1 F (36.7 C)  98.5 F (36.9 C)  TempSrc:    Oral  SpO2:      Weight:   (S) 55 kg   Height:        Exam: Gen thin adult female, [place] O2 Sclera anicteric, throat clear  No jvd or bruits Chest clear bilat to bases RRR no RG Abd soft ntnd no mass or ascites +bs Ext no LEedema Neuro is Ox3, nonfocal    RUA AVF+bruit     Renal-related home meds: - coreg 12.5 qd - sensipar 30 hs - renavite - revela 3 ac tid      [facility] HD: [facility]  3h  400/1.5   45kg  2/2 bath AVF  Heparin none - last OP HD 12/14, post wt 45.7kg - making it to her dry wt, occ under - mircera 30 q 4, last 11/25, due 12/23  - hectorol 6       Assessment/ Plan: Septic shock / klebsiella bacteremia - getting IV abx, bp's remain soft.  HFrEF - per cards consult, LVEF 30-35% and severe AI / TR and moderate MR. Not a candidate for surgical repair.  Atrial flutter - per cards. BP too low to add rate control meds Fistulogram - R BBT AVF placed 2021, decreased access flows. 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 HD today.  BP - BP's soft, holding home coreg.  Volume - euvolemic on exam. CXR shows sig CM, question vasc congestion. Get stand wt when able. Not sure wts are accurate. Pt looks dry.  Anemia of eskd - Hb here 13 - last 3 outpt Hb's were 11.8, 12.1 and 12.9 on 12/12. Close to baseline.  MBD ckd - CCa sig elevated ~ 11.4, hold hectorol for now. Cont sensipar and binders when eating.  H/o SLE       Rob Arlean Hopping MD  [facility] 12/16/2023, 4:12  PM  Recent Labs  Lab 12/13/23 0610 12/13/23 0918 12/14/23 0231 12/15/23 0821 12/16/23 0209 12/16/23 0737  HGB 13.8   < > 12.8 12.4 12.3  --   ALBUMIN 2.4*  --  2.1*  --   --   --   CALCIUM 10.2  --  9.8 9.8 10.4*  --   PHOS  --    < > 7.5* 5.5*  --  6.2*  CREATININE 6.55*  --  7.65* 5.31* 6.27*  --   K 3.9   < > 4.5 4.8 3.7  --    < > = values in this interval not displayed.   No results for input(s): "IRON", "TIBC", "FERRITIN" in the last 168 hours. Inpatient medications:  Chlorhexidine Gluconate Cloth  6 each Topical Q0600   Chlorhexidine Gluconate Cloth  6 each Topical Q0600   ciprofloxacin  500 mg Oral Daily   heparin  5,000 Units Subcutaneous Q8H   midodrine  5 mg Oral TID WC   sodium chloride flush  10 mL  Intravenous Q12H    albumin human 25 g (12/16/23 1138)   methylPREDNISolone (SOLU-MEDROL) injection 1,000 mg (12/16/23 1547)   acetaminophen, albumin human, HYDROmorphone

## 2023-12-16 NOTE — Progress Notes (Signed)
Net UF - . Patient hypotensive during HD, Albumin 25% 25 grams given times 2 PA Orpha Bur is aware.  Patient refused assistance with cleaning herself after bowel movement.

## 2023-12-17 DIAGNOSIS — L905 Scar conditions and fibrosis of skin: Secondary | ICD-10-CM | POA: Diagnosis not present

## 2023-12-17 DIAGNOSIS — L989 Disorder of the skin and subcutaneous tissue, unspecified: Secondary | ICD-10-CM | POA: Diagnosis not present

## 2023-12-17 DIAGNOSIS — R7881 Bacteremia: Secondary | ICD-10-CM | POA: Diagnosis present

## 2023-12-17 LAB — CBC WITH DIFFERENTIAL/PLATELET
Abs Immature Granulocytes: 0.03 10*3/uL (ref 0.00–0.07)
Basophils Absolute: 0 10*3/uL (ref 0.0–0.1)
Basophils Relative: 1 %
Eosinophils Absolute: 0 10*3/uL (ref 0.0–0.5)
Eosinophils Relative: 1 %
HCT: 37.1 % (ref 36.0–46.0)
Hemoglobin: 12.2 g/dL (ref 12.0–15.0)
Immature Granulocytes: 1 %
Lymphocytes Relative: 9 %
Lymphs Abs: 0.6 10*3/uL — ABNORMAL LOW (ref 0.7–4.0)
MCH: 29.8 pg (ref 26.0–34.0)
MCHC: 32.9 g/dL (ref 30.0–36.0)
MCV: 90.7 fL (ref 80.0–100.0)
Monocytes Absolute: 0.2 10*3/uL (ref 0.1–1.0)
Monocytes Relative: 3 %
Neutro Abs: 5.3 10*3/uL (ref 1.7–7.7)
Neutrophils Relative %: 85 %
Platelets: 62 10*3/uL — ABNORMAL LOW (ref 150–400)
RBC: 4.09 MIL/uL (ref 3.87–5.11)
RDW: 19.7 % — ABNORMAL HIGH (ref 11.5–15.5)
WBC: 6.1 10*3/uL (ref 4.0–10.5)
nRBC: 0 % (ref 0.0–0.2)

## 2023-12-17 LAB — CBC
HCT: 36.8 % (ref 36.0–46.0)
Hemoglobin: 12 g/dL (ref 12.0–15.0)
MCH: 29.1 pg (ref 26.0–34.0)
MCHC: 32.6 g/dL (ref 30.0–36.0)
MCV: 89.3 fL (ref 80.0–100.0)
Platelets: 61 10*3/uL — ABNORMAL LOW (ref 150–400)
RBC: 4.12 MIL/uL (ref 3.87–5.11)
RDW: 19.3 % — ABNORMAL HIGH (ref 11.5–15.5)
WBC: 5.8 10*3/uL (ref 4.0–10.5)
nRBC: 0 % (ref 0.0–0.2)

## 2023-12-17 LAB — ENA+DNA/DS+ANTICH+CENTRO+JO...
Anti JO-1: <0.2 AI (ref 0.0–0.9)
Centromere Ab Screen: <0.2 AI (ref 0.0–0.9)
Chromatin Ab SerPl-aCnc: 3.4 AI — ABNORMAL HIGH (ref 0.0–0.9)
ENA SM Ab Ser-aCnc: >8 AI — ABNORMAL HIGH (ref 0.0–0.9)
Ribonucleic Protein: 1 AI — ABNORMAL HIGH (ref 0.0–0.9)
SSA (Ro) (ENA) Antibody, IgG: >8 AI — ABNORMAL HIGH (ref 0.0–0.9)
SSB (La) (ENA) Antibody, IgG: <0.2 AI (ref 0.0–0.9)
Scleroderma (Scl-70) (ENA) Antibody, IgG: <0.2 AI (ref 0.0–0.9)
ds DNA Ab: 1 [IU]/mL (ref 0–9)

## 2023-12-17 LAB — RENAL FUNCTION PANEL
Albumin: 2.3 g/dL — ABNORMAL LOW (ref 3.5–5.0)
Anion gap: 14 (ref 5–15)
BUN: 32 mg/dL — ABNORMAL HIGH (ref 6–20)
CO2: 26 mmol/L (ref 22–32)
Calcium: 10.2 mg/dL (ref 8.9–10.3)
Chloride: 95 mmol/L — ABNORMAL LOW (ref 98–111)
Creatinine, Ser: 4.05 mg/dL — ABNORMAL HIGH (ref 0.44–1.00)
GFR, Estimated: 14 mL/min — ABNORMAL LOW (ref >60)
Glucose, Bld: 197 mg/dL — ABNORMAL HIGH (ref 70–99)
Phosphorus: 5.5 mg/dL — ABNORMAL HIGH (ref 2.5–4.6)
Potassium: 4 mmol/L (ref 3.5–5.1)
Sodium: 135 mmol/L (ref 135–145)

## 2023-12-17 LAB — COMPLEMENT, TOTAL: Compl, Total (CH50): 17 U/mL — ABNORMAL LOW (ref >41)

## 2023-12-17 LAB — ANTI-DNA ANTIBODY, DOUBLE-STRANDED: ds DNA Ab: 1 [IU]/mL (ref 0–9)

## 2023-12-17 LAB — ANTI-SMITH ANTIBODY: ENA SM Ab Ser-aCnc: >8 AI — ABNORMAL HIGH (ref 0.0–0.9)

## 2023-12-17 LAB — ANA W/REFLEX IF POSITIVE: Anti Nuclear Antibody (ANA): POSITIVE — AB

## 2023-12-17 LAB — MAGNESIUM: Magnesium: 2.4 mg/dL (ref 1.7–2.4)

## 2023-12-17 MED ORDER — CHLORHEXIDINE GLUCONATE CLOTH 2 % EX PADS
6.0000 | MEDICATED_PAD | Freq: Every day | CUTANEOUS | Status: DC
Start: 2023-12-18 — End: 2023-12-19
  Administered 2023-12-18 – 2023-12-19 (×2): 6 via TOPICAL

## 2023-12-17 MED ORDER — NEPRO/CARBSTEADY PO LIQD
237.0000 mL | Freq: Three times a day (TID) | ORAL | Status: DC
  Administered 2023-12-17 – 2023-12-22 (×13): 237 mL via ORAL

## 2023-12-17 MED ORDER — LACTATED RINGERS IV BOLUS
500.0000 mL | Freq: Once | INTRAVENOUS | Status: AC
  Administered 2023-12-17: 500 mL via INTRAVENOUS

## 2023-12-17 MED ORDER — HYDROMORPHONE HCL 1 MG/ML IJ SOLN
INTRAMUSCULAR | Status: AC
  Filled 2023-12-17: qty 1

## 2023-12-17 MED ORDER — HYDROMORPHONE HCL 1 MG/ML IJ SOLN
1.0000 mg | Freq: Once | INTRAMUSCULAR | Status: AC
  Administered 2023-12-17: 1 mg via INTRAVENOUS

## 2023-12-17 MED ORDER — LOPERAMIDE HCL 2 MG PO CAPS
2.0000 mg | ORAL_CAPSULE | ORAL | Status: DC | PRN
  Administered 2023-12-17 – 2023-12-21 (×7): 2 mg via ORAL
  Filled 2023-12-17 (×7): qty 1

## 2023-12-17 NOTE — Plan of Care (Signed)
  Problem: Coping: Goal: Level of anxiety will decrease Outcome: Progressing   Problem: Elimination: Goal: Will not experience complications related to bowel motility Outcome: Progressing Goal: Will not experience complications related to urinary retention Outcome: Progressing   Problem: Pain Management: Goal: General experience of comfort will improve Outcome: Progressing   Problem: Safety: Goal: Ability to remain free from injury will improve Outcome: Progressing   Problem: Skin Integrity: Goal: Risk for impaired skin integrity will decrease Outcome: Not Progressing Note: Wound care consult order placed for area to Right inner thigh.

## 2023-12-17 NOTE — Consult Note (Signed)
WOC Nurse Consult Note: Reason for Consult: Requested to assess blister to the inner right thigh. Wound type: Blister, wound stage 2. History of lupus. Dialysis. Measurement: Blister 1 - 11x3cm Blister 2 - 2x2cm Blister 3 - 12x7cm Wound bed: 100% red Drainage (amount, consistency, odor) low amount Periwound: intact Dressing procedure/placement/frequency: Apply foam dressing, change every 3 days. Not use dressing that can cause adherence.  WOC team will not plan to follow further.  Please reconsult if further assistance is needed. Thank-you,  Denyse Amass BSN, RN, [employer], WOC  (Pager: [phone])

## 2023-12-17 NOTE — Progress Notes (Signed)
[facility] Progress Note  Subjective: seen in room. Unable to pull any fluid yest w/ HD due to low bp's. Pleasant today.   Vitals:   12/17/23 0400 12/17/23 0724 12/17/23 1111 12/17/23 1321  BP: (!) 99/58 101/69 104/69   Pulse: (!) 129 (!) 129  (!) 125  Resp: 15 (!) 23 (!) 25   Temp:  98 F (36.7 C) 98 F (36.7 C)   TempSrc:  Oral Oral   SpO2: 97% 96% 100%   Weight:      Height:        Exam: Gen thin adult female, [place] O2 Sclera anicteric, throat clear  No jvd or bruits Chest clear bilat to bases RRR no RG Abd soft ntnd no mass or ascites +bs Ext no LEedema Neuro is Ox3, nonfocal    RUA AVF+bruit     Renal-related home meds: - coreg 12.5 qd - sensipar 30 hs - renavite - revela 3 ac tid      [facility] HD: [facility]  3h  400/1.5   45kg  2/2 bath AVF  Heparin none - last OP HD 12/14, post wt 45.7kg - making it to her dry wt, occ under - mircera 30 q 4, last 11/25, due 12/23  - hectorol 6       Assessment/ Plan: Septic shock / klebsiella bacteremia - getting IV abx, BP's a little better in the 100s. Is on midodrine 5 tid, will ^ to 7.5 tid.  HFrEF - per cards consult, LVEF 30-35% and severe AI / TR and moderate MR. Not a candidate for surgical repair.  Atrial flutter - per cards. BP too low to add rate control meds Fistulogram - R BBT AVF placed 2021, decreased access flows. 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 HD Sat.  BP - BP's soft, holding home coreg. Midodrine as above.  Volume - euvolemic on exam. CXR showed sig CM, question vasc congestion. Get stand wt when able. Not sure wts are accurate. Pt looks dry.  Anemia of eskd - Hb here 13 - last 3 outpt Hb's were 11.8, 12.1 and 12.9 on 12/12. Close to baseline.  MBD ckd - CCa sig elevated ~ 11.4, holding hectorol for now. Cont sensipar and binders when eating.   H/o SLE - is on IV steroids per Coletta Memos MD  [facility] 12/17/2023, 3:20 PM  Recent Labs  Lab 12/14/23 0231 12/15/23 0821 12/16/23 0209 12/16/23 0737 12/17/23 0227  HGB 12.8   < > 12.3  --  12.2  12.0  ALBUMIN 2.1*  --   --   --  2.3*  CALCIUM 9.8   < > 10.4*  --  10.2  PHOS 7.5*   < >  --  6.2* 5.5*  CREATININE 7.65*   < > 6.27*  --  4.05*  K 4.5   < > 3.7  --  4.0   < > = values in this interval not displayed.   No results for input(s): "IRON", "TIBC", "FERRITIN" in the last 168 hours. Inpatient medications:  Chlorhexidine Gluconate Cloth  6 each Topical Q0600   ciprofloxacin  500 mg Oral Daily   feeding supplement (NEPRO CARB STEADY)  237 mL Oral TID WC   HYDROmorphone       midodrine  5 mg  Oral TID WC   sodium chloride flush  10 mL Intravenous Q12H    albumin human 25 g (12/16/23 1138)   methylPREDNISolone (SOLU-MEDROL) injection 1,000 mg (12/17/23 1427)   acetaminophen, albumin human, HYDROmorphone, HYDROmorphone, loperamide

## 2023-12-17 NOTE — TOC Initial Note (Signed)
Transition of Care [facility]) - Initial/Assessment Note    Patient Details  Name: [name] MRN: [id number] Date of Birth: 1981/05/12  [facility] CM/SW Contact:    Marliss Coots, LCSW Phone Number: 12/17/2023, 11:56 AM  Clinical Narrative:                  11:56 AM CSW introduced herself and role to patient at bedside. Patient's family member was also present at bedside. Patient consented this CSW to speak in front of family member. CSW followed up on Substance Use consult and offered resources. Patient declined resources. CSW offered SDOH resources. Patient requested resources for housing. CSW offered bus passes for discharge, which patient accepted. CSW returned to patient's side, where adult daughter was present. Patient consented CSW to speak in front of daughter. CSW provided patient and daughter with housing resources and stated that bus passes will be provided for discharge.  Expected Discharge Plan: Homeless Shelter Barriers to Discharge: Continued Medical Work up   Expected Discharge Plan and Services In-house Referral: Clinical Social Work     Living arrangements for the past 2 months: Homeless Shelter                                      Prior Living Arrangements/Services Living arrangements for the past 2 months: Homeless Shelter Lives with:: Self Patient language and need for interpreter reviewed:: Yes        Need for Family Participation in Patient Care: Yes (Comment) Care giver support system in place?: Yes (comment)   Criminal Activity/Legal Involvement Pertinent to Current Situation/Hospitalization: No - Comment as needed  Activities of Daily Living   ADL Screening (condition at time of admission) Independently performs ADLs?: Yes (appropriate for developmental age) Is the patient deaf or have difficulty hearing?: No Does the patient have difficulty seeing, even when wearing glasses/contacts?: No Does the patient have difficulty  concentrating, remembering, or making decisions?: No  Permission Sought/Granted   Permission granted to share information with : Yes, Verbal Permission Granted  Share Information with NAME: Miyah Heinert     Permission granted to share info w Relationship: Mother  Permission granted to share info w Contact Information: [phone]  Emotional Assessment Appearance:: Appears stated age Attitude/Demeanor/Rapport: Engaged Affect (typically observed): Accepting, Appropriate, Stable, Calm Orientation: : Oriented to Self, Oriented to Place, Oriented to Situation   Psych Involvement: No (comment)  Admission diagnosis:  Shortness of breath [R06.02] Lactic acidosis [E87.20] Sepsis (HCC) [A41.9] New onset atrial flutter (HCC) [I48.92] Patient Active Problem List   Diagnosis Date Noted   Thrombocytopenia (HCC) 12/14/2023   Septic shock (HCC) 12/13/2023   Acute on chronic heart failure (HCC) 12/06/2022   Seizures (HCC) 12/04/2022   R Subdural hematoma (HCC) 12/04/2022   Chronic HFrEF (heart failure with reduced ejection fraction) (HCC) 11/30/2022   Nonrheumatic aortic valve insufficiency 11/30/2022   Nonrheumatic mitral valve regurgitation 11/30/2022   Non-ischemic cardiomyopathy (HCC) 11/30/2022   Acute febrile illness 04/12/2022   Hypertensive urgency 04/12/2022   GERD (gastroesophageal reflux disease) 04/12/2022   HFrEF (heart failure with reduced ejection fraction) (HCC) 04/12/2022   Elevated troponin 04/12/2022   SIRS (systemic inflammatory response syndrome) (HCC) 04/12/2022   Hyperkalemia, diminished renal excretion 12/15/2021   Trichomonas vaginitis 03/25/2021   Bacterial vaginitis 03/25/2021   Encounter for screening for COVID-19 01/24/2021   Hemodialysis access, fistula mature (HCC) 01/22/2021   Allergic  reaction 01/22/2021   Acute respiratory failure with hypoxia (HCC) 11/02/2020   Acute hyperkalemia 07/28/2020   Nicotine dependence, cigarettes, uncomplicated 07/28/2020    Essential hypertension 07/28/2020   Polysubstance abuse (HCC) 07/28/2020   Hemodialysis catheter malfunction (HCC) 07/28/2020   Complication of AV dialysis fistula, sequela 07/28/2020   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HCC) 05/18/2020   Bacterial intestinal infection, unspecified 12/14/2019   Left shoulder pain 08/15/2019   Cellulitis, unspecified 05/02/2019   Hyperkalemia 07/29/2018   SVT (supraventricular tachycardia) (HCC) 07/16/2018   Cardiomyopathy, unspecified (HCC) 12/03/2016   Diarrhea 04/18/2016   Chronic combined systolic (congestive) and diastolic (congestive) heart failure (HCC) 03/16/2016   Other hypervolemia    S/P thoracentesis    Cough with hemoptysis    Myalgia    Pulmonary edema    Dependence on renal dialysis (HCC) 10/15/2015   Rash and nonspecific skin eruption 06/26/2015   Secondary Raynaud's phenomenon 06/24/2015   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   Fever, unspecified 08/28/2014   Pruritus, unspecified 08/28/2014   Headache, unspecified 04/30/2014   Pain, unspecified 04/30/2014   Cramping of hands 04/19/2014   Contraceptive management 04/19/2014   Mild protein-calorie malnutrition (HCC) 03/22/2014   Insomnia 03/15/2014   Benign hypertension with ESRD (end-stage renal disease) (HCC) 03/15/2014   Tobacco abuse 02/15/2014   Healthcare maintenance 02/15/2014   Iron deficiency anemia, unspecified 02/12/2014   Coagulation defect, unspecified (HCC) 02/10/2014   Anemia in chronic kidney disease 02/09/2014   Secondary hyperparathyroidism of renal origin (HCC) 02/09/2014   Hypoalbuminemia 02/01/2014   Nephrotic syndrome 02/01/2014   ESRD on dialysis (HCC) 01/31/2014   Pleural effusion, left 01/31/2014   Microcytic anemia 01/29/2014   Hypocalcemia 01/29/2014   Bacteremia 01/23/2013   Marijuana smoker (HCC) 01/18/2013   H/O pericarditis 01/17/2013   Lupus (systemic lupus erythematosus) (HCC) 01/17/2013   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   S/P pericardiocentesis  01/17/2013   H/O pleural effusion 01/17/2013   Nephrosis 01/17/2013   Preseptal cellulitis 01/17/2013   Cardiac tamponade 12/29/2004   Pericardial effusion (noninflammatory) 12/29/2004   PCP:  [name], NP Pharmacy:   [facility] [phone] - [place], [place] - [address] AT [facility] [address] [place] [address] Phone: [phone] Fax: [phone]     Social Drivers of Health (SDOH) Social History: SDOH Screenings   Food Insecurity: Patient Declined (12/13/2023)  Housing: Patient Declined (12/13/2023)  Transportation Needs: Patient Declined (12/13/2023)  Utilities: Patient Declined (12/13/2023)  Depression (PHQ2-9): Low Risk  (04/29/2022)  Tobacco Use: High Risk (12/13/2023)   SDOH Interventions:     Readmission Risk Interventions    04/17/2022    8:36 AM  Readmission Risk Prevention Plan  Transportation Screening Complete  PCP or Specialist Appt within 3-5 Days Complete  HRI or Home Care Consult Complete  Social Work Consult for Recovery Care Planning/Counseling Complete  Palliative Care Screening Not Applicable  Medication Review Oceanographer) Complete

## 2023-12-17 NOTE — Evaluation (Signed)
Physical Therapy Evaluation Patient Details Name: [name] MRN: [id number] DOB: [birth year] Today's Date: 12/17/2023  History of Present Illness  42 y.o. female admitted 12/16 with SOB, sepsis. 12/18 basilic vein angioplasty. 12/20 RLE punch biopsy. PMhx: ESRD on HD T/T/S, lupus, HFrEF (EF 35-40%), anemia, polysubstance abuse, Rt SDH, seizures  Clinical Impression  Pt with mumbled speech and very indecisive about initially participating with conflicting statements. Pt ultimately agreeable to pericare, linen change and up to chair but requires increased time, cues and assist. Pt was independent living with mom in 2nd story apartment without AD PTA. Pt currently requires mod assist to get OOB to chair and demonstrates impaired cognition and safety. Pt will benefit from acute therapy to maximize mobility, safety and function to decrease burden of care.   HR 125        If plan is discharge home, recommend the following: A lot of help with walking and/or transfers;A lot of help with bathing/dressing/bathroom;Assistance with cooking/housework;Direct supervision/assist for financial management;Assist for transportation;Supervision due to cognitive status;Direct supervision/assist for medications management   Can travel by private vehicle   No    Equipment Recommendations Hospital bed;BSC/3in1  Recommendations for Other Services       Functional Status Assessment Patient has had a recent decline in their functional status and/or demonstrates limited ability to make significant improvements in function in a reasonable and predictable amount of time     Precautions / Restrictions Precautions Precautions: Fall      Mobility  Bed Mobility Overal bed mobility: Needs Assistance Bed Mobility: Rolling, Sidelying to Sit Rolling: Min assist Sidelying to sit: Min assist       General bed mobility comments: multimodal cues and assist to roll to left due to incontinent BM with assist for  pericare. physical assist to clear legs and lift trunk from surface    Transfers Overall transfer level: Needs assistance   Transfers: Sit to/from Stand, Bed to chair/wheelchair/BSC Sit to Stand: Mod assist, +2 physical assistance Stand pivot transfers: Min assist, +2 physical assistance         General transfer comment: pt refusing to attempt to stand without physical assist and wanting to be "pulled up" and requesting daughter to assist as well. Pt mod +2 assist to stand with UB support and pt then able to side step and get to chair with min +2 assist with cues for sequence. Once seated in chair pt using armrest to lift and scoot without assist    Ambulation/Gait               General Gait Details: unable  Stairs            Wheelchair Mobility     Tilt Bed    Modified Rankin (Stroke Patients Only)       Balance Overall balance assessment: Needs assistance   Sitting balance-Leahy Scale: Fair     Standing balance support: Bilateral upper extremity supported Standing balance-Leahy Scale: Poor                               Pertinent Vitals/Pain Pain Assessment Pain Assessment: 0-10 Pain Score: 5  Pain Location: sacrum Pain Descriptors / Indicators: Aching, Burning, Sore Pain Intervention(s): Limited activity within patient's tolerance, Monitored during session, Repositioned    Home Living Family/patient expects to be discharged to:: Private residence Living Arrangements: Parent Available Help at Discharge: Family;Available 24 hours/day Type of Home: Apartment Home Access: Stairs  to enter   Entrance Stairs-Number of Steps: 16   Home Layout: One level Home Equipment: Wheelchair - manual      Prior Function Prior Level of Function : Independent/Modified Independent                     Extremity/Trunk Assessment   Upper Extremity Assessment Upper Extremity Assessment: Generalized weakness    Lower Extremity  Assessment Lower Extremity Assessment: Generalized weakness    Cervical / Trunk Assessment Cervical / Trunk Assessment: Normal  Communication   Communication Communication: Difficulty communicating thoughts/reduced clarity of speech Cueing Techniques: Verbal cues;Gestural cues;Tactile cues  Cognition Arousal: Alert Behavior During Therapy: Restless Overall Cognitive Status: Impaired/Different from baseline Area of Impairment: Orientation, Attention, Memory, Following commands, Safety/judgement, Problem solving, Awareness                 Orientation Level: Disoriented to, Time Current Attention Level: Sustained Memory: Decreased short-term memory Following Commands: Follows one step commands inconsistently, Follows one step commands with increased time Safety/Judgement: Decreased awareness of safety, Decreased awareness of deficits   Problem Solving: Slow processing, Requires verbal cues, Requires tactile cues General Comments: Pt with mumbled speech and difficult to understand throughout with family providing answers at times. Pt self-limiting        General Comments      Exercises     Assessment/Plan    PT Assessment Patient needs continued PT services  PT Problem List Decreased strength;Decreased activity tolerance;Decreased balance;Decreased mobility;Decreased knowledge of use of DME;Decreased cognition;Decreased coordination       PT Treatment Interventions DME instruction;Gait training;Functional mobility training;Therapeutic activities;Therapeutic exercise;Patient/family education;Cognitive remediation;Neuromuscular re-education;Balance training    PT Goals (Current goals can be found in the Care Plan section)  Acute Rehab PT Goals Patient Stated Goal: go home PT Goal Formulation: With patient/family Time For Goal Achievement: 12/31/23 Potential to Achieve Goals: Fair    Frequency Min 1X/week     Co-evaluation               AM-PAC PT "6  Clicks" Mobility  Outcome Measure Help needed turning from your back to your side while in a flat bed without using bedrails?: A Little Help needed moving from lying on your back to sitting on the side of a flat bed without using bedrails?: A Little Help needed moving to and from a bed to a chair (including a wheelchair)?: A Lot Help needed standing up from a chair using your arms (e.g., wheelchair or bedside chair)?: A Lot Help needed to walk in hospital room?: Total Help needed climbing 3-5 steps with a railing? : Total 6 Click Score: 12    End of Session   Activity Tolerance: Patient limited by fatigue Patient left: in chair;with call bell/phone within reach;with chair alarm set;with family/visitor present Nurse Communication: Mobility status PT Visit Diagnosis: Other abnormalities of gait and mobility (R26.89);Muscle weakness (generalized) (M62.81)    Time: 1610-9604 PT Time Calculation (min) (ACUTE ONLY): 31 min   Charges:   PT Evaluation $PT Eval Moderate Complexity: 1 Mod PT Treatments $Therapeutic Activity: 8-22 mins PT General Charges $$ ACUTE PT VISIT: 1 Visit         Merryl Hacker, PT Acute Rehabilitation Services Office: [phone]   Wallis Spizzirri B Aloysius Heinle 12/17/2023, 1:30 PM

## 2023-12-17 NOTE — Progress Notes (Addendum)
Summary: [name]  is a 42 y.o. female with ESRD on HD T/Th/S, lupus and HFrEF (EF 35-40%) who presents with SOB, diarrhea and A flutter and admitted to the [facility] for sepsis secondary to klebsiella bacteremia.   Hospital Day: 4   Subjective:  Patient seen and evaluated while resting in bed. She reports she is feeling the same as yesterday. She reports she is able to eat and drink. She endorses her diarrhea is better than yesterday. Denies difficulty breathing. Denies blisters in mouth. She endorses pain when skin lesions are touched.   Later this AM, Dr. Antony [name], Dr. Versie [name] and I spoke with patient's daughter about the patient's health status and prognosis.   Objective:  Vital signs in last 24 hours: Vitals:   12/16/23 2200 12/16/23 2300 12/17/23 0300 12/17/23 0400  BP: 93/68 102/64 105/69 (!) 99/58  Pulse: (!) 132 (!) 132  (!) 129  Resp: 20 17 17 15   Temp:  97.6 F (36.4 C) 98 F (36.7 C)   TempSrc:  Oral Oral   SpO2: 99% 97% 96% 97%  Weight:      Height:       Weight change:   Intake/Output Summary (Last 24 hours) at 12/17/2023 0981 Last data filed at 12/16/2023 1845 Gross per 24 hour  Intake 250 ml  Output -100 ml  Net 350 ml   Physical Exam General: Tired-appearing, laying in hospital bed, cooperative Mouth: No blisters appreciated on outer lips  Cardiovascular: Tachycardic; murmur heard L upper sternal boarder, suspect diastolic with her hx of aortic regurgitation but hard to tell with concurrent tachycardia  Respiratory: No labored breathing Integumentary: 3 lesions present on R leg today; worse than yesterday; original lesion appears to have drained some since yesterday; new ~3 inch lesion on medial thigh with evidence of sloughing of skin; small 0.5 inch lesion on proximal R thigh Extremities: No peripheral edema          Latest Ref Rng & Units 12/17/2023    2:27 AM 12/16/2023    3:37 PM 12/16/2023    2:09 AM  CBC  WBC 4.0 - 10.5 K/uL 5.8   6.5    Hemoglobin 12.0 - 15.0 g/dL 19.1   47.8   Hematocrit 36.0 - 46.0 % 36.8   36.9   Platelets 150 - 400 K/uL 61  61  71       Latest Ref Rng & Units 12/17/2023    2:27 AM 12/16/2023    2:09 AM 12/15/2023    8:21 AM  CMP  Glucose 70 - 99 mg/dL 295  621  308   BUN 6 - 20 mg/dL 32  51  44   Creatinine 0.44 - 1.00 mg/dL 6.57  8.46  9.62   Sodium 135 - 145 mmol/L 135  134  133   Potassium 3.5 - 5.1 mmol/L 4.0  3.7  4.8   Chloride 98 - 111 mmol/L 95  96  97   CO2 22 - 32 mmol/L 26  26  23    Calcium 8.9 - 10.3 mg/dL 95.2  84.1  9.8    DIC Panel PT: 17.4 INR: 1.4 aPTT: 36 Fibrinogen: 449 D-Dimer: 3.85 Platelets: 61 Smear: No Schistocytes  Assessment/Plan:  Principal Problem:   Bacteremia Active Problems:   Lupus (systemic lupus erythematosus) (HCC)   ESRD on dialysis (HCC)   Diarrhea   Chronic HFrEF (heart failure with reduced ejection fraction) (HCC)   Septic shock (HCC)   Thrombocytopenia (HCC)  #Septic  Shock 2/2 to Klebsiella bacteremia Stable but still very sick. Continue ciprofloxacin. Goals of care discussion started this admission. Will need to continue discussion with patient and family. Mother at bedside; she was updated on status and prognosis. Daughter was called and Dr. Antony [name] provided her an update and discussed the patient's prognosis this morning and again in person when she arrived at hospital.  - Continue ciprofloxacin - Repeat blood cultures - CBC, CMP tomorrow AM  #SLE #Lupus Nephritis Per chart review, patient followed by [name] previously for SLE but has not followed up recently. In 2014, documentation stated she had not seen a Rheumatologist in over 5 years. 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   Low C3 and total complement, C4 WNL. Concerned for active  lupus flare as her WBC, Hgb and platelets continue to decrease. Lupus labs still pending to assess active flare. Pulse steroids started yesterday. Discussed case with [facility] Physician-to-Physician Consultation with Rheumatology. After discussion, will continue pulse steroids and repeat blood cultures to make sure patient is not fighting an infection before considering additional lupus medications. Will continue to touch base with Rheumatology and assess if transfer to [facility] is needed.  - Continue solumedrol 1000 mg IV at 66 mL/hr q24 hours x2 doses  - F/u punch biopsy results 12/20 - F/u Lupus serologies   #Atrial Flutter with RVR New onset atrial flutter on admission. Today, she is still tachycardic. Telemetry reading sinus tachycardia when team was in the room. EKG ordered to assess rhythm. Computer read long Qtc (QT/QTcB 410/600), wide QRS tachycardia. After reviewing with Dr. Benito [name], suspect atrial flutter with 2:1 block and a Qtc of 475. - Continue to monitor  #R Leg Bullous Lesion Worsening. Lesion is slightly larger and two additional lesions with sloughing of the skin present on R thigh. Concerned for cutaneous manifestation of underlying lupus vs DIC vs traumatic injury. DIC panel does not reveal DIC with normal fibrinogen. Traumatic injury possible given family's story that she fell in the shower last Sunday and developed a bruise in that area. Unsure if this is the etiology, but given her low platelets, possible. Highest on the differential is an active lupus flare. Bedside punch biopsy performed today. See procedure note.  - Punch Biopsy  #HFrEF EF 35-40% in 2023 with mild aortic stenosis and severe aortic regurgitation. CXR on admission consistent with pulmonary edema. BNP on admission 4,500. Echo yesterday revealed EF of 30-35%, LV and RV decreased function. Dilation of all four chambers. Significant valvular disease. Cardiology consulted and  advised that she is not a candidate for surgery and recommended hospice consultation if she becomes symptomatic. Net UF -100 mL during dialysis yesterday. - Continue to monitor  #ESRD on HD T/Th/S Last HD 12/19. Nephrology following. Recommends continuing to hold coreg and hectorol and continue censipar and binders when eating. Also started her on midodrine yesterday for hypotension. - Continue to monitor  #Substance Abuse On admission, patient's mother reports last cocaine use on 12/12. TOC attempted to see patient day before yesterday but was unable to given patient was not fully oriented.   #Anemia of Chronic Disease Hb decreased to 12.0 from 12.3 yesterday and 13.8 on admission. Baseline reported by admission team of ~11. Stable. Suspect decrease may be due to sepsis or possible lupus flare.   #Thrombocytopenia Platelets decreased  to 61 from 77 yesterday and 103 on admission. Admission team notes they were within normal range one year ago. Continue to think it is either sepsis-related or lupus flare. SQ Lovenox held given continued decrease in platelet count - CBC tomorrow AM  #Diarrhea Improved since yesterday. Continue to monitor  #Ascites #Bilateral pleural effusions Ascites, small bilateral pleural effusions and associated bilateral lower lobe opacities, left greater right, atelectasis versus pneumonia present on CT. Last CT A/P in 2017 showed prominent abdominal and pelvic ascites with diffuse edema throughout the subcutaneous fat, bilateral pleural effusions with lower lung zone consolidation atelectasis as well as patchy infiltration suggesting pneumonia or edema. No difficulty breathing noted today. - Will continue to monitor.  Diet: Renal VTE: None as patient's platelet count continued to decrease (61 today) IVF: None Code: Full   Dispo: Anticipate discharge in 2-3 days.    LOS: 3 days   Bubba Hales, Medical Student 12/17/2023, 6:25 AM

## 2023-12-17 NOTE — Progress Notes (Signed)
OT Cancellation Note  Patient Details Name: [name] MRN: [id number] DOB: June 05, 1981   Cancelled Treatment:    Reason Eval/Treat Not Completed: Pain limiting ability to participate;Fatigue/lethargy limiting ability to participate Per RN, recent biopsy of leg this morning, given dilaudid due to pain and also lethargic. Asked to hold therapy attempts for now and will follow up later as schedule permits.   Lorre Munroe 12/17/2023, 8:12 AM

## 2023-12-17 NOTE — Progress Notes (Signed)
PT Cancellation Note  Patient Details Name: ARADHYA DUSEL MRN: [id number] DOB: 1981-06-13   Cancelled Treatment:    Reason Eval/Treat Not Completed: Patient declined, no reason specified (pt refused mobility stating family brought breakfast and she would not get up until after eating)   Lorraina Spring B Leialoha Hanna 12/17/2023, 9:11 AM Merryl Hacker, PT Acute Rehabilitation Services Office: [phone]

## 2023-12-17 NOTE — Progress Notes (Signed)
Patient with two episodes of diarrhea and incontinence since 1900. Immodium given. Patient says she is in pain, 8/10 in her right leg. Tylenol and Dilaudid given. Family in room with patient.

## 2023-12-17 NOTE — Procedures (Signed)
Punch Biopsy Procedure Note  Pre-operative Diagnosis: Bullous lesion   Locations:right thigh  Anesthesia: Lidocaine 1% without epinephrine   Procedure Details  History of allergy to lidocaine: no Sensitivity to epinephrine: N/A Patient informed of the risks (including bleeding and infection) and benefits of the  procedure and verbal informed consent obtained.  Prior to procedure, patient was given IV dilaudid. The lesion and surrounding area was prepped with an alcohol swab and sterile iodine. The skin was then stretched perpendicular to the skin tension lines and the lesion removed using the  . The resulting ellipse was cauterized with topical silver nitrate. Patient tolerated this well without complication. Bleeding was minimal. Bandage was applied with bacitracin. The specimen was sent for pathologic examination.  Condition: Stable  Complications: none.  Plan: 1. Instructed to keep the wound dry and covered for 24-48 hours and clean thereafter. 2. Patient instructed to apply Vaseline or Bacitracin daily until healed. 3. Warning signs of infection were reviewed.    Procedure was directly supervised with Dr. Reymundo Poll, MD.   Bubba Hales, Medical Student

## 2023-12-17 NOTE — Progress Notes (Signed)
EKG CRITICAL VALUE     12 lead EKG performed.  Critical value noted.  Viviann Spare , NT, notified.   Edmonia Caprio, CCT 12/17/2023 10:11 AM

## 2023-12-17 NOTE — Evaluation (Signed)
Occupational Therapy Evaluation Patient Details Name: [name] MRN: [id number] DOB: 1981-05-12 Today's Date: 12/17/2023   History of Present Illness 42 y.o. female admitted 12/16 with SOB, sepsis. 12/18 basilic vein angioplasty. 12/20 RLE punch biopsy. PMhx: ESRD on HD T/T/S, lupus, HFrEF (EF 35-40%), anemia, polysubstance abuse, Rt SDH, seizures   Clinical Impression   PTA, pt lives with mother and typically completely Independent with all ADLs, IADLs, driving and mobility without AD. Pt presents now with deficits in cognition, strength, standing balance and endurance. Pt requires Mod-Max A to stand but once in standing, able to pivot to [facility] with Min A. Pt impulsive, easily agitated and requires frequent cueing for safety. Pt requires Mod A for UB ADL and Max A for LB ADLs including toileting hygiene after bout of bowel incontinence. Family unable to provide the level of care pt currently requires so recommend inpatient follow up therapy, <3 hours/day at DC.      If plan is discharge home, recommend the following: A lot of help with walking and/or transfers;Two people to help with walking and/or transfers;Two people to help with bathing/dressing/bathroom;A lot of help with bathing/dressing/bathroom    Functional Status Assessment  Patient has had a recent decline in their functional status and demonstrates the ability to make significant improvements in function in a reasonable and predictable amount of time.  Equipment Recommendations  Other (comment) (TBD pending progress)    Recommendations for Other Services       Precautions / Restrictions Precautions Precautions: Fall;Other (comment) Precaution Comments: watch HR Restrictions Weight Bearing Restrictions Per Provider Order: No      Mobility Bed Mobility Overal bed mobility: Needs Assistance Bed Mobility: Sit to Supine       Sit to supine: Mod assist   General bed mobility comments: in chair on entry. Mod A for  LE back to bed and to reposition trunk back in middle    Transfers Overall transfer level: Needs assistance Equipment used: Rolling walker (2 wheels) Transfers: Sit to/from Stand, Bed to chair/wheelchair/BSC Sit to Stand: Mod assist, Max assist Stand pivot transfers: Min assist         General transfer comment: sliding down in chair on entry with family asking for assist to reposition. with ex on one side and step by step cues for pt, able to scoot pt back withMod A x 2. Requesting BSC assist. Mod A to stand from recliner with minimal muscle activation and poor hand placement despite cues. MIn A to pivot to [facility] and MAx A to stand from [facility] for hygiene assist.      Balance                                           ADL either performed or assessed with clinical judgement   ADL Overall ADL's : Needs assistance/impaired Eating/Feeding: Set up;Sitting Eating/Feeding Details (indicate cue type and reason): assist to cut up food and open containers. difficult to attend to tasks at times Grooming: Minimal assistance;Sitting   Upper Body Bathing: Sitting;Moderate assistance   Lower Body Bathing: Maximal assistance;Sitting/lateral leans;Sit to/from stand   Upper Body Dressing : Moderate assistance;Sitting   Lower Body Dressing: Maximal assistance;Sitting/lateral leans;Sit to/from stand   Toilet Transfer: Moderate assistance;Stand-pivot;BSC/3in1;Rolling walker (2 wheels) Toilet Transfer Details (indicate cue type and reason): significant assist to stand due to minimal muscle activation but once up, able to pivot  with Min A using RW Toileting- Clothing Manipulation and Hygiene: Sit to/from stand;Maximal assistance Toileting - Clothing Manipulation Details (indicate cue type and reason): pt adamant on performing hygiene on her own due to pain in bottom; assist for balance and clothing mgmt. attempted to clean pt though pt adamantly declined and told OT to stop              Vision Ability to See in Adequate Light: 0 Adequate Patient Visual Report: No change from baseline Vision Assessment?: No apparent visual deficits     Perception         Praxis         Pertinent Vitals/Pain Pain Assessment Pain Assessment: Faces Faces Pain Scale: Hurts a little bit Pain Location: generalized Pain Descriptors / Indicators: Grimacing, Sore Pain Intervention(s): Monitored during session, Limited activity within patient's tolerance, Repositioned     Extremity/Trunk Assessment Upper Extremity Assessment Upper Extremity Assessment: Generalized weakness;Right hand dominant   Lower Extremity Assessment Lower Extremity Assessment: Defer to PT evaluation   Cervical / Trunk Assessment Cervical / Trunk Assessment: Normal   Communication Communication Communication: Difficulty communicating thoughts/reduced clarity of speech Cueing Techniques: Verbal cues;Gestural cues;Tactile cues   Cognition Arousal: Alert Behavior During Therapy: Restless, Agitated, Flat affect Overall Cognitive Status: Impaired/Different from baseline Area of Impairment: Orientation, Attention, Memory, Following commands, Safety/judgement, Problem solving, Awareness                 Orientation Level: Disoriented to, Situation Current Attention Level: Sustained Memory: Decreased short-term memory Following Commands: Follows one step commands inconsistently, Follows one step commands with increased time Safety/Judgement: Decreased awareness of safety, Decreased awareness of deficits Awareness: Intellectual, Emergent Problem Solving: Slow processing, Requires verbal cues, Requires tactile cues General Comments: Pt with mumbled speech and difficult to understand throughout with family providing answers at times. Pt self-limiting, agitated at times and restless throughout. difficult to fully assess cognition due to pt agitated with questions at times     General Comments  daughter  and ex at bedside. another daughter on speakerphone. HR 130s    Exercises     Shoulder Instructions      Home Living Family/patient expects to be discharged to:: Private residence Living Arrangements: Parent Available Help at Discharge: Family;Available 24 hours/day Type of Home: Apartment Home Access: Stairs to enter Entrance Stairs-Number of Steps: 16   Home Layout: One level     Bathroom Shower/Tub: Chief Strategy Officer: Standard     Home Equipment: Wheelchair - manual          Prior Functioning/Environment Prior Level of Function : Independent/Modified Independent             Mobility Comments: no AD ADLs Comments: reports managing ADLs, IADLs including med mgmt, driving and grocery shopping        OT Problem List: Decreased strength;Decreased activity tolerance;Impaired balance (sitting and/or standing);Decreased cognition;Decreased safety awareness;Decreased knowledge of use of DME or AE      OT Treatment/Interventions: Self-care/ADL training;Therapeutic exercise;Energy conservation;DME and/or AE instruction;Therapeutic activities    OT Goals(Current goals can be found in the care plan section) Acute Rehab OT Goals Patient Stated Goal: feel better OT Goal Formulation: With patient/family Time For Goal Achievement: 12/31/23 Potential to Achieve Goals: Fair ADL Goals Pt Will Perform Grooming: with contact guard assist;standing Pt Will Perform Lower Body Bathing: with contact guard assist;sitting/lateral leans;sit to/from stand Pt Will Transfer to Toilet: with min assist;ambulating Pt Will Perform Toileting - Clothing Manipulation and hygiene:  with min assist;sit to/from stand;sitting/lateral leans  OT Frequency: Min 1X/week    Co-evaluation              AM-PAC OT "6 Clicks" Daily Activity     Outcome Measure Help from another person eating meals?: A Little Help from another person taking care of personal grooming?: A Little Help  from another person toileting, which includes using toliet, bedpan, or urinal?: Total Help from another person bathing (including washing, rinsing, drying)?: A Lot Help from another person to put on and taking off regular upper body clothing?: A Lot Help from another person to put on and taking off regular lower body clothing?: Total 6 Click Score: 12   End of Session Nurse Communication: Mobility status  Activity Tolerance: Other (comment) (limited by cognition) Patient left: in chair;with call bell/phone within reach;with chair alarm set;with family/visitor present  OT Visit Diagnosis: Unsteadiness on feet (R26.81);Other abnormalities of gait and mobility (R26.89);Other symptoms and signs involving cognitive function                Time: 1356-1435 OT Time Calculation (min): 39 min Charges:  OT General Charges $OT Visit: 1 Visit OT Evaluation $OT Eval Moderate Complexity: 1 Mod OT Treatments $Self Care/Home Management : 23-37 mins  Bradd Canary, OTR/L Acute Rehab Services Office: [phone]   Lorre Munroe 12/17/2023, 2:31 PM

## 2023-12-18 DIAGNOSIS — B961 Klebsiella pneumoniae [K. pneumoniae] as the cause of diseases classified elsewhere: Secondary | ICD-10-CM | POA: Diagnosis not present

## 2023-12-18 DIAGNOSIS — R7881 Bacteremia: Secondary | ICD-10-CM | POA: Diagnosis not present

## 2023-12-18 LAB — CBC
HCT: 33.8 % — ABNORMAL LOW (ref 36.0–46.0)
Hemoglobin: 11.3 g/dL — ABNORMAL LOW (ref 12.0–15.0)
MCH: 29.7 pg (ref 26.0–34.0)
MCHC: 33.4 g/dL (ref 30.0–36.0)
MCV: 88.7 fL (ref 80.0–100.0)
Platelets: 79 10*3/uL — ABNORMAL LOW (ref 150–400)
RBC: 3.81 MIL/uL — ABNORMAL LOW (ref 3.87–5.11)
RDW: 19.9 % — ABNORMAL HIGH (ref 11.5–15.5)
WBC: 11.2 10*3/uL — ABNORMAL HIGH (ref 4.0–10.5)
nRBC: 0 % (ref 0.0–0.2)

## 2023-12-18 LAB — BASIC METABOLIC PANEL
Anion gap: 13 (ref 5–15)
BUN: 71 mg/dL — ABNORMAL HIGH (ref 6–20)
CO2: 25 mmol/L (ref 22–32)
Calcium: 10.4 mg/dL — ABNORMAL HIGH (ref 8.9–10.3)
Chloride: 98 mmol/L (ref 98–111)
Creatinine, Ser: 5.53 mg/dL — ABNORMAL HIGH (ref 0.44–1.00)
GFR, Estimated: 9 mL/min — ABNORMAL LOW (ref >60)
Glucose, Bld: 145 mg/dL — ABNORMAL HIGH (ref 70–99)
Potassium: 4.1 mmol/L (ref 3.5–5.1)
Sodium: 136 mmol/L (ref 135–145)

## 2023-12-18 LAB — HEMOGLOBIN A1C
Hgb A1c MFr Bld: 5.4 % (ref 4.8–5.6)
Mean Plasma Glucose: 108.28 mg/dL

## 2023-12-18 MED ORDER — PREDNISONE 20 MG PO TABS
40.0000 mg | ORAL_TABLET | Freq: Every day | ORAL | Status: DC
  Administered 2023-12-19: 40 mg via ORAL
  Filled 2023-12-18: qty 2

## 2023-12-18 MED ORDER — ALTEPLASE 2 MG IJ SOLR: 2.0000 mg | Freq: Once | INTRAMUSCULAR | Status: DC | PRN

## 2023-12-18 MED ORDER — LIDOCAINE-PRILOCAINE 2.5-2.5 % EX CREA: 1.0000 | TOPICAL_CREAM | CUTANEOUS | Status: DC | PRN

## 2023-12-18 MED ORDER — HEPARIN SODIUM (PORCINE) 1000 UNIT/ML DIALYSIS: 1000.0000 [IU] | INTRAMUSCULAR | Status: DC | PRN

## 2023-12-18 MED ORDER — GERHARDT'S BUTT CREAM
TOPICAL_CREAM | CUTANEOUS | Status: DC | PRN
  Filled 2023-12-18: qty 60

## 2023-12-18 MED ORDER — NEPRO/CARBSTEADY PO LIQD
237.0000 mL | ORAL | Status: DC | PRN
  Administered 2023-12-18: 237 mL via ORAL

## 2023-12-18 MED ORDER — HEPARIN SODIUM (PORCINE) 5000 UNIT/ML IJ SOLN
5000.0000 [IU] | Freq: Three times a day (TID) | INTRAMUSCULAR | Status: DC
  Administered 2023-12-18 – 2023-12-20 (×4): 5000 [IU] via SUBCUTANEOUS
  Filled 2023-12-18 (×5): qty 1

## 2023-12-18 MED ORDER — ANTICOAGULANT SODIUM CITRATE 4% (200MG/5ML) IV SOLN: 5.0000 mL | Status: DC | PRN

## 2023-12-18 MED ORDER — LIDOCAINE HCL (PF) 1 % IJ SOLN: 5.0000 mL | INTRAMUSCULAR | Status: DC | PRN

## 2023-12-18 MED ORDER — PENTAFLUOROPROP-TETRAFLUOROETH EX AERO: 1.0000 | INHALATION_SPRAY | CUTANEOUS | Status: DC | PRN

## 2023-12-18 NOTE — Progress Notes (Signed)
   12/18/23 2000  Vitals  Temp (!) 96.2 F (35.7 C)  Pulse Rate (!) 103  Resp 20  BP (!) 99/55  SpO2 99 %  O2 Device Room Air  Weight 57 kg  Type of Weight Post-Dialysis  Oxygen Therapy  Patient Activity (if Appropriate) In bed  Pulse Oximetry Type Continuous  Oximetry Probe Site Changed No  Post Treatment  Dialyzer Clearance Lightly streaked  Hemodialysis Intake (mL) 0 mL  Liters Processed 67.3  Fluid Removed (mL) 1400 mL  Tolerated HD Treatment Yes  AVG/AVF Arterial Site Held (minutes) 10 minutes  AVG/AVF Venous Site Held (minutes) 10 minutes   Received patient in bed to unit.  Alert and oriented to person--forgetful to follow some commands---this is a baseline for her Informed consent signed and in chart.   TX duration:3.0 prt md order  Patient tolerated well.  Transported back to the room  Alert, without acute distress. ----complete bed change secondary to smear of stool Hand-off given to patient's nurse.   Access used: LUAF---suture from s/p fistulogram to be removed with next tx Access issues: no complications  Total UF removed: 1400 Medication(s) given: none   [facility]

## 2023-12-18 NOTE — Plan of Care (Signed)
  Problem: Education: Goal: Knowledge of General Education information will improve Description: Including pain rating scale, medication(s)/side effects and non-pharmacologic comfort measures Outcome: Progressing   Problem: Health Behavior/Discharge Planning: Goal: Ability to manage health-related needs will improve Outcome: Progressing   Problem: Clinical Measurements: Goal: Ability to maintain clinical measurements within normal limits will improve Outcome: Progressing   Problem: Activity: Goal: Risk for activity intolerance will decrease Outcome: Progressing   Problem: Pain Management: Goal: General experience of comfort will improve Outcome: Progressing   Problem: Skin Integrity: Goal: Risk for impaired skin integrity will decrease Outcome: Progressing

## 2023-12-18 NOTE — Progress Notes (Signed)
[facility] Progress Note  Subjective: seen in HD. BP's a bit better, able to pull 1-1.5 L w/ HD today.   Vitals:   12/18/23 1730 12/18/23 1800 12/18/23 1830 12/18/23 1900  BP: 99/66 (!) 93/57  (!) 99/55  Pulse: (!) 103 90 (!) 102 (!) 105  Resp: (!) 28 (!) 9 (!) 29 (!) 26  Temp:      TempSrc:      SpO2: 98% 97% 93% 100%  Weight:      Height:        Exam: Gen thin adult female, [place] O2 Sclera anicteric, throat clear  No jvd or bruits Chest clear bilat to bases RRR no RG Abd soft ntnd no mass or ascites +bs Ext no LEedema Neuro is Ox3, nonfocal    RUA AVF+bruit     Renal-related home meds: - coreg 12.5 qd - sensipar 30 hs - renavite - revela 3 ac tid      [facility]  3h  400/1.5   45kg  2/2 bath AVF  Heparin none - last OP HD 12/14, post wt 45.7kg - making it to her dry wt, occ under - mircera 30 q 4, last 11/25, due 12/23  - hectorol 6       Assessment/ Plan: Septic shock / klebsiella bacteremia - getting IV abx, BP's a little better. Started midodrine, continue at 5 tid.  HFrEF - per cards consult, LVEF 30-35% and severe AI / TR and moderate MR. Not a candidate for surgical repair.  Atrial flutter - per cards. BP too low to add rate control meds Fistulogram - R BBT AVF placed 2021, decreased access flows. 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  ESRD - on HD TTS. HD today. Next HD Monday on holiday schedule.  BP - BP's soft, holding home coreg. Midodrine as above.  Volume - euvolemic on exam. CXR showed sig CM, question vasc congestion. Get stand wt when able. Not sure wts are accurate. Pt looks dry.  Anemia of esrd - Hb here 13 - last 3 outpt Hb's were 11.8, 12.1 and 12.9 on 12/12. Close to baseline.  MBD ckd - CCa sig elevated ~ 11.4, holding hectorol for now. Cont sensipar and binders when eating. Check PTT.   H/o  SLE - is on IV steroids per Coletta Memos MD  [facility] 12/18/2023, 7:16 PM  Recent Labs  Lab 12/14/23 0231 12/15/23 0821 12/16/23 0737 12/17/23 0227 12/18/23 0214  HGB 12.8   < >  --  12.2  12.0 11.3*  ALBUMIN 2.1*  --   --  2.3*  --   CALCIUM 9.8   < >  --  10.2 10.4*  PHOS 7.5*   < > 6.2* 5.5*  --   CREATININE 7.65*   < >  --  4.05* 5.53*  K 4.5   < >  --  4.0 4.1   < > = values in this interval not displayed.   No results for input(s): "IRON", "TIBC", "FERRITIN" in the last 168 hours. Inpatient medications:  Chlorhexidine Gluconate Cloth  6 each Topical Q0600   ciprofloxacin  500 mg Oral Daily   feeding supplement (NEPRO CARB STEADY)  237 mL Oral TID WC   heparin  5,000 Units Subcutaneous Q8H   midodrine  5 mg Oral TID  WC   [START ON 12/19/2023] predniSONE  40 mg Oral Q breakfast   sodium chloride flush  10 mL Intravenous Q12H    albumin human 25 g (12/16/23 1138)   anticoagulant sodium citrate     methylPREDNISolone (SOLU-MEDROL) injection 1,000 mg (12/17/23 1427)   acetaminophen, albumin human, alteplase, anticoagulant sodium citrate, feeding supplement (NEPRO CARB STEADY), Gerhardt's butt cream, heparin, HYDROmorphone, lidocaine (PF), lidocaine-prilocaine, loperamide, pentafluoroprop-tetrafluoroeth

## 2023-12-18 NOTE — Progress Notes (Addendum)
HD#4 SUBJECTIVE:  Patient Summary: [name] is a 42 y.o. female with a pertinent PMH of ESRD on HD, poorly controlled SLE, HFrEF, and severe multivalvular disease who presented with hypotension, shortness of breath, and diarrhea and is admitted for sepsis treatment.   Overnight Events: None  Interim History: Patient laying in bed in her own stool. She states she feels terrible, but about the same as yesterday. She does not identify any specific areas of discomfort. Feels the pain medicine is helping her. She remains distanced and does not engage much with discussions about her medical care. We spoke with her about the importance of getting to the bedside commode to prevent her leg wound from becoming infected.   OBJECTIVE:  Vital Signs: Vitals:   12/17/23 1920 12/18/23 0235 12/18/23 0808 12/18/23 1104  BP: 110/66 104/85 105/71 104/60  Pulse: (!) 116 (!) 128 (!) 119 (!) 130  Resp: 20 12 14 19   Temp: 97.6 F (36.4 C) 97.7 F (36.5 C)    TempSrc: Oral Oral    SpO2: 98% 96% 93% 93%  Weight:      Height:       Supplemental O2: Nasal Cannula SpO2: 93 % O2 Flow Rate (L/min): 2 L/min  Filed Weights   12/14/23 1850 12/16/23 0855 12/16/23 1436  Weight: 54.3 kg 54.8 kg (S) 55 kg     Intake/Output Summary (Last 24 hours) at 12/18/2023 1150 Last data filed at 12/18/2023 4098 Gross per 24 hour  Intake 1158.86 ml  Output --  Net 1158.86 ml   Net IO Since Admission: 3,107.49 mL [12/18/23 1150]  Physical Exam: General: ill-appearing, frail, in discomfort; laying in stool Cardiac: tachycardic in the 120s-130s; JVD present; distal pulses in tact, extremities well perfused Pulmonary: normal respiratory rate and effort Skin: three leg lesions relatively unchanged from yesterday, covered and dry (see images) Neuro: Alert and oriented. No focal deficits noted. Psych: She remains frustrated and distances herself from discussions of medical care.   Patient Lines/Drains/Airways  Status     Active Line/Drains/Airways     Name Placement date Placement time Site Days   Peripheral IV 12/14/23 20 G 1.88" Anterior;Left Forearm 12/14/23  0803  Forearm  4   Fistula / Graft Right Upper arm 12/18/19  1900  Upper arm  1461   Fistula / Graft Right Upper arm Arteriovenous fistula 08/01/20  0835  Upper arm  1234   Wound / Incision (Open or Dehisced) Other (Comment) Thigh Right blood blister/full thickness --  --  Thigh  --           Pertinent Labs:    Latest Ref Rng & Units 12/18/2023    2:14 AM 12/17/2023    2:27 AM 12/16/2023    3:37 PM  CBC  WBC 4.0 - 10.5 K/uL 11.2  5.8    6.1    Hemoglobin 12.0 - 15.0 g/dL 11.9  14.7    82.9    Hematocrit 36.0 - 46.0 % 33.8  36.8    37.1    Platelets 150 - 400 K/uL 79  61    62  61       Latest Ref Rng & Units 12/18/2023    2:14 AM 12/17/2023    2:27 AM 12/16/2023    2:09 AM  CMP  Glucose 70 - 99 mg/dL 562  130  865   BUN 6 - 20 mg/dL 71  32  51   Creatinine 0.44 - 1.00 mg/dL 7.84  6.96  6.27   Sodium 135 - 145 mmol/L 136  135  134   Potassium 3.5 - 5.1 mmol/L 4.1  4.0  3.7   Chloride 98 - 111 mmol/L 98  95  96   CO2 22 - 32 mmol/L 25  26  26    Calcium 8.9 - 10.3 mg/dL 84.1  32.4  40.1     ASSESSMENT/PLAN:  Assessment: Principal Problem:   Bacteremia Active Problems:   Lupus (systemic lupus erythematosus) (HCC)   ESRD on dialysis (HCC)   Diarrhea   Chronic HFrEF (heart failure with reduced ejection fraction) (HCC)   Septic shock (HCC)   Thrombocytopenia (HCC)  Plan:  klebsiella bacteremia Remains tachycardic into 120s this morning. BP is low-normal. Suspect that the infection is adequately treated with a total of 5 days of antibiotics, but we will continue antibiotics until cultures result. Repeated blood culture pending, which was recommended by rheumatology prior to initiating any lupus immunosuppressive medications. She remains borderline hypotensive, but this has improved slightly since beginning on  midodrine. Overall, she still appears very sick, despite appropriate treatment of her infection.  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 She was started on pulse dose steroids on 12/19 for concerns of active lupus. Rheumatology was consulted at [facility], who agreed with our current management plan. We will finish the high dose steroids today and transition to a slow prednisone taper.  - continue meythlprednisolone 1g, day 3/3 - start oral prednisone taper tomorrow  New onset atrial flutter with RVR HFrEF with significant multivalvular disease She remains in atrial flutter today with tachycardia into the 130s. She is unfortunately not a candidate for any procedural intervention given her overall condition. Her hypotension has precluded any rate control medications. We may consider rhythm control medication and anticoagulation to try to chemically convert her at some point if the family decides to continue to pursue aggressive treatment, but for now we are focusing on treating her bacteremia and lupus.  - cardiac monitoring  ESRD on HD, T/Th/S She is scheduled for hemodialysis today. Nephrology is following. No acute electrolyte concerns this morning.   Thrombocytopenia Platelets 79 this morning, up from 69 yesterday. Suspect this is secondary to her infection and lupus flare, rather than HIT. - restarted Heparin for VTE prophylaxis  Right leg lesions She had a few large bullous right leg lesions, which have burst. A punch biopsy was taken yesterday and results are pending. Wound care was consulted and the wounds are now being kept covered per their recommendations. We are worried about her stool contaminating these wounds, since she is not  getting out of bed to use the bathroom, so we spoke with her about the importance of doing so and also discussed this with her nurse.  - F/u punch biopsy - Encourage use of bedside commode  Goals of care We are continuing our conversations with the patient and her family about goals of care. At this time, we are concerned that she remains quite sick despite treatment of her infection and lupus flare. We spoke with her two daughters yesterday about our recommendation that she be DNR, as we do not feel she would likely regain meaningful function if her heart were to stop. They have not made any decisions about this yet. For now,  we are continuing with aggressive treatment of her potentially treatable conditions, per the family and patient's wishes, but will continue to have ongoing goals of care discussions over the next few days.   Best Practice: Diet: Renal diet IVF: Fluids: None VTE: Heparin, restarted Code: Full AB: Ciprofloxacin Family Contact: daughter, to be updated DISPO: Anticipated discharge to be determined, pending medical workup and treatment.   Signature: Annett Fabian, MD  Internal Medicine Resident, PGY-1 [facility]  Pager: [phone] 11:50 AM, 12/18/2023   Please contact the on call pager after 5 pm and on weekends at [phone].

## 2023-12-19 ENCOUNTER — Encounter (HOSPITAL_COMMUNITY): Payer: Self-pay | Admitting: Internal Medicine

## 2023-12-19 DIAGNOSIS — B961 Klebsiella pneumoniae [K. pneumoniae] as the cause of diseases classified elsewhere: Secondary | ICD-10-CM | POA: Diagnosis not present

## 2023-12-19 DIAGNOSIS — R7881 Bacteremia: Secondary | ICD-10-CM | POA: Diagnosis not present

## 2023-12-19 LAB — BASIC METABOLIC PANEL
Anion gap: 16 — ABNORMAL HIGH (ref 5–15)
BUN: 55 mg/dL — ABNORMAL HIGH (ref 6–20)
CO2: 23 mmol/L (ref 22–32)
Calcium: 9.8 mg/dL (ref 8.9–10.3)
Chloride: 95 mmol/L — ABNORMAL LOW (ref 98–111)
Creatinine, Ser: 4.07 mg/dL — ABNORMAL HIGH (ref 0.44–1.00)
GFR, Estimated: 14 mL/min — ABNORMAL LOW (ref >60)
Glucose, Bld: 171 mg/dL — ABNORMAL HIGH (ref 70–99)
Potassium: 4.3 mmol/L (ref 3.5–5.1)
Sodium: 134 mmol/L — ABNORMAL LOW (ref 135–145)

## 2023-12-19 LAB — CBC
HCT: 34.8 % — ABNORMAL LOW (ref 36.0–46.0)
Hemoglobin: 11.8 g/dL — ABNORMAL LOW (ref 12.0–15.0)
MCH: 29.6 pg (ref 26.0–34.0)
MCHC: 33.9 g/dL (ref 30.0–36.0)
MCV: 87.4 fL (ref 80.0–100.0)
Platelets: 97 10*3/uL — ABNORMAL LOW (ref 150–400)
RBC: 3.98 MIL/uL (ref 3.87–5.11)
RDW: 19.9 % — ABNORMAL HIGH (ref 11.5–15.5)
WBC: 10.3 10*3/uL (ref 4.0–10.5)
nRBC: 0.2 % (ref 0.0–0.2)

## 2023-12-19 MED ORDER — MELATONIN 3 MG PO TABS
3.0000 mg | ORAL_TABLET | Freq: Every day | ORAL | Status: DC
Start: 2023-12-19 — End: 2023-12-22
  Administered 2023-12-19 – 2023-12-21 (×3): 3 mg via ORAL
  Filled 2023-12-19 (×3): qty 1

## 2023-12-19 MED ORDER — CHLORHEXIDINE GLUCONATE CLOTH 2 % EX PADS
6.0000 | MEDICATED_PAD | Freq: Every day | CUTANEOUS | Status: DC
  Administered 2023-12-21 – 2023-12-22 (×2): 6 via TOPICAL

## 2023-12-19 NOTE — Progress Notes (Addendum)
Pt refusing assistance with peri care from staff. Pt educated on the consequences of not having good peri care.

## 2023-12-19 NOTE — Plan of Care (Signed)
  Problem: Education: Goal: Knowledge of General Education information will improve Description: Including pain rating scale, medication(s)/side effects and non-pharmacologic comfort measures Outcome: Progressing   Problem: Health Behavior/Discharge Planning: Goal: Ability to manage health-related needs will improve Outcome: Progressing   Problem: Clinical Measurements: Goal: Ability to maintain clinical measurements within normal limits will improve Outcome: Progressing Goal: Will remain free from infection Outcome: Progressing Goal: Diagnostic test results will improve Outcome: Progressing Goal: Respiratory complications will improve Outcome: Progressing Goal: Cardiovascular complication will be avoided Outcome: Progressing   Problem: Activity: Goal: Risk for activity intolerance will decrease Outcome: Progressing   Problem: Nutrition: Goal: Adequate nutrition will be maintained Outcome: Progressing   Problem: Coping: Goal: Level of anxiety will decrease Outcome: Progressing   Problem: Elimination: Goal: Will not experience complications related to bowel motility Outcome: Progressing Goal: Will not experience complications related to urinary retention Outcome: Progressing   Problem: Pain Management: Goal: General experience of comfort will improve Outcome: Progressing   Problem: Safety: Goal: Ability to remain free from injury will improve Outcome: Progressing   Problem: Skin Integrity: Goal: Risk for impaired skin integrity will decrease Outcome: Progressing   Problem: Education: Goal: Ability to demonstrate management of disease process will improve Outcome: Progressing Goal: Ability to verbalize understanding of medication therapies will improve Outcome: Progressing   Problem: Activity: Goal: Capacity to carry out activities will improve Outcome: Progressing   Problem: Cardiac: Goal: Ability to achieve and maintain adequate cardiopulmonary perfusion  will improve Outcome: Progressing   Problem: Fluid Volume: Goal: Hemodynamic stability will improve Outcome: Progressing   Problem: Clinical Measurements: Goal: Diagnostic test results will improve Outcome: Progressing Goal: Signs and symptoms of infection will decrease Outcome: Progressing   Problem: Respiratory: Goal: Ability to maintain adequate ventilation will improve Outcome: Progressing

## 2023-12-19 NOTE — TOC Progression Note (Addendum)
Transition of Care [facility]) - Progression Note    Patient Details  Name: [name] MRN: [id number] Date of Birth: October 20, 1981  Transition [facility]) CM/SW Contact  Norely Schlick A [place], [place] Phone Number: 12/19/2023, 11:26 AM  Clinical Narrative:     CSW spoke with pt and pt's daughter Bridgett via phone to discuss recommendation for SNF. Bridgett stated they were agreeable to SNF workup. Pt's daughter live in [place], the are was included in the search CSW explained SNF process and that CSW would follow up with bed offers when available.If pt's insurance is barrier, Bridgett stated she would like to discuss out of pocket cost. SNF workup completed. Bed offers pending.    TOC will continue to follow.   Expected Discharge Plan: Homeless Shelter Barriers to Discharge: Continued Medical Work up  Expected Discharge Plan and Services In-house Referral: Clinical Social Work     Living arrangements for the past 2 months: Homeless Shelter                                       Social Determinants of Health (SDOH) Interventions SDOH Screenings   Food Insecurity: Patient Declined (12/13/2023)  Housing: Patient Declined (12/13/2023)  Transportation Needs: Patient Declined (12/13/2023)  Utilities: Patient Declined (12/13/2023)  Depression (PHQ2-9): Low Risk  (04/29/2022)  Tobacco Use: High Risk (12/13/2023)    Readmission Risk Interventions    04/17/2022    8:36 AM  Readmission Risk Prevention Plan  Transportation Screening Complete  PCP or Specialist Appt within 3-5 Days Complete  HRI or Home Care Consult Complete  Social Work Consult for Recovery Care Planning/Counseling Complete  Palliative Care Screening Not Applicable  Medication Review Oceanographer) Complete

## 2023-12-19 NOTE — Progress Notes (Addendum)
RN was notified by family that they have witnessed the pt spitting out pills after the RN has left the room. They first witnessed it on 12/18/2023 and then saw a dissolved pill on the table in the pts room today. MD was notified.

## 2023-12-19 NOTE — [place] FL2 (Signed)
[place] MEDICAID FL2 LEVEL OF CARE FORM     IDENTIFICATION  Patient Name: [name] Birthdate: January 27, 1981 Sex: female Admission Date (Current Location): 12/13/2023  [facility] and [place] Number:  Producer, television/film/video and Address:  [facility], [address], [place], [place] [address]      Provider Number: [id number]  Attending Physician Name and Address:  Tyson Alias, *  Relative Name and Phone Number:  Zainub Gano  Daughter  [phone]    Current Level of Care: Hospital Recommended Level of Care: Skilled Nursing Facility Prior Approval Number:    Date Approved/Denied:   PASRR Number: [id number]  Discharge Plan: SNF    Current Diagnoses: Patient Active Problem List   Diagnosis Date Noted   Thrombocytopenia (HCC) 12/14/2023   Septic shock (HCC) 12/13/2023   Acute on chronic heart failure (HCC) 12/06/2022   Seizures (HCC) 12/04/2022   R Subdural hematoma (HCC) 12/04/2022   Chronic HFrEF (heart failure with reduced ejection fraction) (HCC) 11/30/2022   Nonrheumatic aortic valve insufficiency 11/30/2022   Nonrheumatic mitral valve regurgitation 11/30/2022   Non-ischemic cardiomyopathy (HCC) 11/30/2022   Acute febrile illness 04/12/2022   Hypertensive urgency 04/12/2022   GERD (gastroesophageal reflux disease) 04/12/2022   HFrEF (heart failure with reduced ejection fraction) (HCC) 04/12/2022   Elevated troponin 04/12/2022   SIRS (systemic inflammatory response syndrome) (HCC) 04/12/2022   Hyperkalemia, diminished renal excretion 12/15/2021   Trichomonas vaginitis 03/25/2021   Bacterial vaginitis 03/25/2021   Encounter for screening for COVID-19 01/24/2021   Hemodialysis access, fistula mature (HCC) 01/22/2021   Allergic reaction 01/22/2021   Acute respiratory failure with hypoxia (HCC) 11/02/2020   Acute hyperkalemia 07/28/2020   Nicotine dependence, cigarettes, uncomplicated 07/28/2020   Essential hypertension  07/28/2020   Polysubstance abuse (HCC) 07/28/2020   Hemodialysis catheter malfunction (HCC) 07/28/2020   Complication of AV dialysis fistula, sequela 07/28/2020   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HCC) 05/18/2020   Bacterial intestinal infection, unspecified 12/14/2019   Left shoulder pain 08/15/2019   Cellulitis, unspecified 05/02/2019   Hyperkalemia 07/29/2018   SVT (supraventricular tachycardia) (HCC) 07/16/2018   Cardiomyopathy, unspecified (HCC) 12/03/2016   Diarrhea 04/18/2016   Chronic combined systolic (congestive) and diastolic (congestive) heart failure (HCC) 03/16/2016   Other hypervolemia    S/P thoracentesis    Cough with hemoptysis    Myalgia    Pulmonary edema    Dependence on renal dialysis (HCC) 10/15/2015   Rash and nonspecific skin eruption 06/26/2015   Secondary Raynaud's phenomenon 06/24/2015   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   Fever, unspecified 08/28/2014   Pruritus, unspecified 08/28/2014   Headache, unspecified 04/30/2014   Pain, unspecified 04/30/2014   Cramping of hands 04/19/2014   Contraceptive management 04/19/2014   Mild protein-calorie malnutrition (HCC) 03/22/2014   Insomnia 03/15/2014   Benign hypertension with ESRD (end-stage renal disease) (HCC) 03/15/2014   Tobacco abuse 02/15/2014   Healthcare maintenance 02/15/2014   Iron deficiency anemia, unspecified 02/12/2014   Coagulation defect, unspecified (HCC) 02/10/2014   Anemia in chronic kidney disease 02/09/2014   Secondary hyperparathyroidism of renal origin (HCC) 02/09/2014   Hypoalbuminemia 02/01/2014   Nephrotic syndrome 02/01/2014   ESRD on dialysis (HCC) 01/31/2014   Pleural effusion, left 01/31/2014   Microcytic anemia 01/29/2014   Hypocalcemia 01/29/2014   Bacteremia 01/23/2013   Marijuana smoker (HCC) 01/18/2013   H/O pericarditis 01/17/2013   Lupus (systemic lupus erythematosus) (HCC) 01/17/2013   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   S/P pericardiocentesis 01/17/2013   H/O pleural  effusion 01/17/2013   Nephrosis 01/17/2013   Preseptal cellulitis 01/17/2013   Cardiac tamponade 12/29/2004   Pericardial effusion (noninflammatory) 12/29/2004    Orientation RESPIRATION BLADDER Height & Weight     Self, Place, Time, Situation  Normal Continent Weight: 125 lb 10.6 oz (57 kg) Height:  5\' 3"  (160 cm)  BEHAVIORAL SYMPTOMS/MOOD NEUROLOGICAL BOWEL NUTRITION STATUS    Convulsions/Seizures Incontinent Diet (see DC summary)  AMBULATORY STATUS COMMUNICATION OF NEEDS Skin   Extensive Assist Verbally Other (Comment) (Wound / Incision (Open or Dehisced) Other (Comment) Thigh Right blood blister/full thickness)                       Personal Care Assistance Level of Assistance  Bathing, Feeding, Dressing Bathing Assistance: Maximum assistance Feeding assistance: Limited assistance Dressing Assistance: Maximum assistance     Functional Limitations Info  Sight, Hearing, Speech Sight Info: Adequate Hearing Info: Adequate Speech Info: Adequate    SPECIAL CARE FACTORS FREQUENCY  PT (By licensed PT), OT (By licensed OT)     PT Frequency: 5x/week OT Frequency: 5x/week            Contractures Contractures Info: Not present    Additional Factors Info  Code Status, Allergies Code Status Info: FULL Allergies Info: Cephalosporins  Tobramycin Sulfate  Vancomycin           Current Medications (12/19/2023):  This is the current hospital active medication list Current Facility-Administered Medications  Medication Dose Route Frequency Provider Last Rate Last Admin   acetaminophen (TYLENOL) tablet 1,000 mg  1,000 mg Oral Q8H PRN Annett Fabian, MD   1,000 mg at 12/18/23 2054   albumin human 25 % solution 25 g  25 g Intravenous BID PRN Delano Metz, MD 60 mL/hr at 12/16/23 1138 25 g at 12/16/23 1138   Chlorhexidine Gluconate Cloth 2 % PADS 6 each  6 each Topical Q0600 Delano Metz, MD   6 each at 12/19/23 0626   ciprofloxacin (CIPRO) tablet 500 mg  500 mg Oral  Daily [name], MD   500 mg at 12/19/23 1016   feeding supplement (NEPRO CARB STEADY) liquid 237 mL  237 mL Oral TID WC [name], MD   237 mL at 12/19/23 1191   Gerhardt's butt cream   Topical PRN Tyson Alias, MD       heparin injection 5,000 Units  5,000 Units Subcutaneous Wyvonnia Lora, MD   5,000 Units at 12/19/23 4782   HYDROmorphone (DILAUDID) tablet 1 mg  1 mg Oral Q6H PRN Annett Fabian, MD   1 mg at 12/18/23 0453   loperamide (IMODIUM) capsule 2 mg  2 mg Oral PRN Reymundo Poll, MD   2 mg at 12/18/23 2054   midodrine (PROAMATINE) tablet 5 mg  5 mg Oral TID WC Delano Metz, MD   5 mg at 12/19/23 9562   predniSONE (DELTASONE) tablet 40 mg  40 mg Oral Q breakfast Annett Fabian, MD   40 mg at 12/19/23 1308   sodium chloride flush (NS) 0.9 % injection 10 mL  10 mL Intravenous Q12H Zadie Rhine, MD   10 mL at 12/19/23 1016     Discharge Medications: Please see discharge summary for a list of discharge medications.  Relevant Imaging Results:  Relevant Lab Results:   Additional Information SSN: [id number] out-pt HD at [facility] on [facility]TS  Mersedes Alber A [place], [place]

## 2023-12-19 NOTE — Progress Notes (Signed)
Interval history Called this morning by nurse for hypotension and lethargy. Assessed on rounds. She reports feeling badly still. She continues to have pain all over and feels too weak to get to the bedside commode. Sometimes diarrhea wakes her from sleep.   Physical exam Blood pressure 101/72, pulse 110, temperature (!) 97.3 F (36.3 C), temperature source Oral, resp. rate 20, height 5\' 3"  (1.6 m), weight 57 kg, SpO2 98%.  No distress Heart rate is irregular and tachycardic, thready bilateral radial pulses, no lower extremity edema, some JVP visible above clavicle while semi-supine Breathing comfortably on room air, lungs are clear in anterior fields Skin is warm and dry, bandages over right thigh Alert and oriented  Labs, images, and other studies BMP stable Stable thrombocytopenia, 97  Assessment and plan Hospital day 5  [name] is a 42 y.o. with chronic heart failure, severe valvular disease, lupus complicated by lupus nephritis, and poor social determinants of health who was admitted for treatment of Klebsiella bacteremia and atrial flutter.  Principal Problem:   Bacteremia Active Problems:   Lupus (systemic lupus erythematosus) (HCC)   ESRD on dialysis (HCC)   Diarrhea   Chronic HFrEF (heart failure with reduced ejection fraction) (HCC)   Septic shock (HCC)   Thrombocytopenia (HCC)   Atrial fibrillation with RVR (HCC)  Klebsiella bacteremia Sepsis has resolved.  12/17/2023 blood cultures have not grown anything.  Today's antibiotic day 8.  Reasonable to continue antibiotics for now. - Ciprofloxacin 500 mg daily  History of lupus Not on antirheumatic drugs outside of the hospital.  No obvious manifestations to suggest lupus flare.  In consultation with [facility]ology we started pulse dose methylprednisolone 3 days ago but will stop steroids as they aren't helping and probability of lupus flare is low. - Discontinue prednisone  Chronic  congestive heart failure With biventricular systolic failure.  Remains relatively hypotensive.  She does not look overtly volume overloaded.  Risks of advanced therapies outweigh benefits.  Atrial flutter With 2:1 conduction, rates around 120, 130.  In setting of mitral stenosis.  Thought to be sequelae of her bacteremia initially but it has not improved.  Rather, this is probably due to her underlying structural heart disease and cardiomyopathy.  Risks of cardioversion outweigh benefits at this point.  Too hypotensive for rate control.  Holding anticoagulation given thrombocytopenia and bleeding rash on legs.  Diarrhea Ruled out infectious causes.  Supportive care with as needed Imodium.  She is weak disengaged, and thus she is soiling the bed constantly.  Encouraged her to get up every 2 hours to use the bedside commode, but she declines.  ESRD Hypotension has been making dialysis difficult.  1.2 L UF yesterday and somewhat hypotensive this morning.  HD every Tuesday Thursday Saturday.  Thrombocytopenia Improving.  No bleeding from rashes today.  Goals of care Unfortunate case.  Significant barriers to good care in the hospital include lassitude, disengagement.  Family expressed concerns to nurse that the patient was spitting out administered medicine today and yesterday.  Despite her family's hopes for recovery, it is not clear to me that this person herself is motivated to continue treatment.  Her prognosis is poor.  It would be reasonable to forego further curative therapy in favor of palliative care if she wishes. Goals of care discussions ongoing with daughter, [name], also involved.  Diet: Renal with 1200 mL fluid restriction IVF: N/A VTE: heparin  injection 5,000 Units Start: 12/18/23 1400  Code: Full PT/OT recommendations: SNF for subacute rehab TOC recommendations: Following for placement Family Update: By phone  Discharge plan: Pending goals of care discussions.  [name] Mood MD 12/19/2023, 2:19 PM  Pager: [phone] After 5pm or weekend: [phone]

## 2023-12-19 NOTE — Progress Notes (Signed)
[facility] Progress Note  Subjective: seen in HD. BP's in 100s - 110s today. Remains feeling badly w/ pain allover and too weak to get up by herself.   Vitals:   12/19/23 0831 12/19/23 1100 12/19/23 1201 12/19/23 1709  BP:  101/72 110/67 119/68  Pulse:  (!) 136 (!) 128 (!) 133  Resp: 16 14 (!) 22 (!) 31  Temp:  (!) 96.4 F (35.8 C)  (!) 96.4 F (35.8 C)  TempSrc:  Oral  Oral  SpO2: 99% 98%  90%  Weight:      Height:        Exam: Gen thin adult female, [place] O2 Sclera anicteric, throat clear  No jvd or bruits Chest clear bilat to bases RRR no RG Abd soft ntnd no mass or ascites +bs Ext no LEedema Neuro is Ox3, nonfocal    RUA AVF+bruit     Renal-related home meds: - coreg 12.5 qd - sensipar 30 hs - renavite - revela 3 ac tid      [facility]  [facility]h  400/1.5   45kg  2/2 bath AVF  Heparin none - last OP HD 12/14, post wt 45.7kg - making it to her dry wt, occ under - mircera 30 q 4, last 11/25, due 12/23  - hectorol 6       Assessment/ Plan: Septic shock / klebsiella bacteremia - getting IV abx, BP's a little better. Started midodrine, continue at 5 tid.  HFrEF - per cards consult, LVEF 30-35% and severe AI / TR and moderate MR. Not a candidate for surgical repair. Cards recommended hospice consultation.  Atrial flutter - per cards. BP too low to add rate control meds ESRD - on HD TTS. HD today. Next HD Monday on holiday schedule.  Fistulogram - R BBT AVF placed 2021, decreased access flows. 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  BP - BP's soft, holding home coreg. Midodrine 5 tid, bp's improving a bit.  Volume - euvolemic on exam. CXR showed sig CM, question vasc congestion. Get stand wt when able. Not sure wts are accurate. Pt looks dry on exam.  Anemia of esrd - Hb here 13 - last 3 outpt Hb's were 11.8, 12.1 and 12.9 on  12/12. Close to baseline.  MBD ckd - CCa sig elevated ~ 11.4, holding hectorol for now. Cont sensipar and binders when eating. Check PTT.   H/o SLE - is on IV steroids per pmd GOC - discussions are ongoing w/ patient and daughter by the primary team       Vinson Moselle MD  [facility] 12/19/2023, 5:20 PM  Recent Labs  Lab 12/14/23 0231 12/15/23 0821 12/16/23 0737 12/17/23 0227 12/18/23 0214 12/19/23 0212  HGB 12.8   < >  --  12.2  12.0 11.3* 11.8*  ALBUMIN 2.1*  --   --  2.3*  --   --   CALCIUM 9.8   < >  --  10.2 10.4* 9.8  PHOS 7.5*   < > 6.2* 5.5*  --   --   CREATININE 7.65*   < >  --  4.05* 5.53* 4.07*  K 4.5   < >  --  4.0 4.1 4.3   < > = values in this interval not displayed.   No results for input(s): "IRON", "TIBC", "FERRITIN" in the last 168 hours. Inpatient  medications:  Chlorhexidine Gluconate Cloth  6 each Topical Q0600   ciprofloxacin  500 mg Oral Daily   feeding supplement (NEPRO CARB STEADY)  237 mL Oral TID WC   heparin  5,000 Units Subcutaneous Q8H   midodrine  5 mg Oral TID WC   sodium chloride flush  10 mL Intravenous Q12H    albumin human 25 g (12/16/23 1138)   acetaminophen, albumin human, Gerhardt's butt cream, HYDROmorphone, loperamide

## 2023-12-19 NOTE — Progress Notes (Signed)
Pts family voiced concerns about the pt not eating and asked if they could bring in food for the pt. Family was told pt is on a renal diet and as long as the food brought in followed that diet they could bring in food. A renal diet education sheet was printed off and viewed with the family. Family stated understand of the education materials.

## 2023-12-20 DIAGNOSIS — I5022 Chronic systolic (congestive) heart failure: Secondary | ICD-10-CM

## 2023-12-20 DIAGNOSIS — M3214 Glomerular disease in systemic lupus erythematosus: Secondary | ICD-10-CM | POA: Diagnosis not present

## 2023-12-20 DIAGNOSIS — N186 End stage renal disease: Secondary | ICD-10-CM

## 2023-12-20 DIAGNOSIS — Z992 Dependence on renal dialysis: Secondary | ICD-10-CM

## 2023-12-20 DIAGNOSIS — R7881 Bacteremia: Secondary | ICD-10-CM | POA: Diagnosis not present

## 2023-12-20 LAB — CBC
HCT: 36.2 % (ref 36.0–46.0)
Hemoglobin: 12.1 g/dL (ref 12.0–15.0)
MCH: 30.1 pg (ref 26.0–34.0)
MCHC: 33.4 g/dL (ref 30.0–36.0)
MCV: 90 fL (ref 80.0–100.0)
Platelets: 114 10*3/uL — ABNORMAL LOW (ref 150–400)
RBC: 4.02 MIL/uL (ref 3.87–5.11)
RDW: 19.5 % — ABNORMAL HIGH (ref 11.5–15.5)
WBC: 11.2 10*3/uL — ABNORMAL HIGH (ref 4.0–10.5)
nRBC: 0.5 % — ABNORMAL HIGH (ref 0.0–0.2)

## 2023-12-20 LAB — BASIC METABOLIC PANEL
Anion gap: 18 — ABNORMAL HIGH (ref 5–15)
BUN: 103 mg/dL — ABNORMAL HIGH (ref 6–20)
CO2: 23 mmol/L (ref 22–32)
Calcium: 10.1 mg/dL (ref 8.9–10.3)
Chloride: 93 mmol/L — ABNORMAL LOW (ref 98–111)
Creatinine, Ser: 5.35 mg/dL — ABNORMAL HIGH (ref 0.44–1.00)
GFR, Estimated: 10 mL/min — ABNORMAL LOW (ref >60)
Glucose, Bld: 154 mg/dL — ABNORMAL HIGH (ref 70–99)
Potassium: 4.4 mmol/L (ref 3.5–5.1)
Sodium: 134 mmol/L — ABNORMAL LOW (ref 135–145)

## 2023-12-20 LAB — PARATHYROID HORMONE, INTACT (NO CA): PTH: 50 pg/mL (ref 15–65)

## 2023-12-20 MED ORDER — PENTAFLUOROPROP-TETRAFLUOROETH EX AERO: 1.0000 | INHALATION_SPRAY | CUTANEOUS | Status: DC | PRN

## 2023-12-20 MED ORDER — APIXABAN 2.5 MG PO TABS
2.5000 mg | ORAL_TABLET | Freq: Two times a day (BID) | ORAL | Status: DC
  Administered 2023-12-20 – 2023-12-22 (×5): 2.5 mg via ORAL
  Filled 2023-12-20 (×5): qty 1

## 2023-12-20 MED ORDER — HEPARIN SODIUM (PORCINE) 1000 UNIT/ML DIALYSIS: 1000.0000 [IU] | INTRAMUSCULAR | Status: DC | PRN

## 2023-12-20 MED ORDER — ALTEPLASE 2 MG IJ SOLR: 2.0000 mg | Freq: Once | INTRAMUSCULAR | Status: DC | PRN

## 2023-12-20 MED ORDER — LIDOCAINE-PRILOCAINE 2.5-2.5 % EX CREA: 1.0000 | TOPICAL_CREAM | CUTANEOUS | Status: DC | PRN

## 2023-12-20 MED ORDER — LIDOCAINE HCL (PF) 1 % IJ SOLN: 5.0000 mL | INTRAMUSCULAR | Status: DC | PRN

## 2023-12-20 MED ORDER — ANTICOAGULANT SODIUM CITRATE 4% (200MG/5ML) IV SOLN: 5.0000 mL | Status: DC | PRN

## 2023-12-20 MED ORDER — NEPRO/CARBSTEADY PO LIQD: 237.0000 mL | ORAL | Status: DC | PRN

## 2023-12-20 MED ORDER — NYSTATIN 100000 UNIT/ML MT SUSP
5.0000 mL | Freq: Four times a day (QID) | OROMUCOSAL | Status: DC
  Administered 2023-12-20 – 2023-12-22 (×9): 500000 [IU] via ORAL
  Filled 2023-12-20 (×10): qty 5

## 2023-12-20 NOTE — Plan of Care (Signed)
  Problem: Education: Goal: Knowledge of General Education information will improve Description: Including pain rating scale, medication(s)/side effects and non-pharmacologic comfort measures Outcome: Progressing   Problem: Health Behavior/Discharge Planning: Goal: Ability to manage health-related needs will improve Outcome: Progressing   Problem: Clinical Measurements: Goal: Ability to maintain clinical measurements within normal limits will improve Outcome: Progressing Goal: Will remain free from infection Outcome: Progressing Goal: Diagnostic test results will improve Outcome: Progressing Goal: Respiratory complications will improve Outcome: Progressing Goal: Cardiovascular complication will be avoided Outcome: Progressing   Problem: Activity: Goal: Risk for activity intolerance will decrease Outcome: Progressing   Problem: Nutrition: Goal: Adequate nutrition will be maintained Outcome: Progressing   Problem: Coping: Goal: Level of anxiety will decrease Outcome: Progressing   Problem: Elimination: Goal: Will not experience complications related to bowel motility Outcome: Progressing Goal: Will not experience complications related to urinary retention Outcome: Progressing   Problem: Pain Management: Goal: General experience of comfort will improve Outcome: Progressing   Problem: Safety: Goal: Ability to remain free from injury will improve Outcome: Progressing   Problem: Skin Integrity: Goal: Risk for impaired skin integrity will decrease Outcome: Progressing   Problem: Education: Goal: Ability to demonstrate management of disease process will improve Outcome: Progressing Goal: Ability to verbalize understanding of medication therapies will improve Outcome: Progressing   Problem: Activity: Goal: Capacity to carry out activities will improve Outcome: Progressing   Problem: Cardiac: Goal: Ability to achieve and maintain adequate cardiopulmonary perfusion  will improve Outcome: Progressing   Problem: Fluid Volume: Goal: Hemodynamic stability will improve Outcome: Progressing   Problem: Clinical Measurements: Goal: Diagnostic test results will improve Outcome: Progressing Goal: Signs and symptoms of infection will decrease Outcome: Progressing   Problem: Respiratory: Goal: Ability to maintain adequate ventilation will improve Outcome: Progressing

## 2023-12-20 NOTE — TOC Progression Note (Addendum)
Transition of Care [facility]) - Progression Note    Patient Details  Name: [name] MRN: [id number] Date of Birth: Sep 19, 1981  [facility]) CM/SW Contact  Marliss Coots, LCSW Phone Number: 12/20/2023, 4:06 PM  Clinical Narrative:     4:06 PM CSW returned to patient's bedside to inform her of therapy recommendation of discharge to SNF and provide sole SNF bed offer due to insurance and age [facility]). Patient expressed understanding of this and accepted bed offer at [facility]. CSW inquired about transportation for discharge. Patient stated that family would be able to provide transportation to SNF. CSW informed patient of ambulance transportation option, which could have a potential copay, with an unknown cost until bill is received. Patient expressed understanding of this information. CSW offered to relay discharge plans to family/friends on behalf of patient. Patient accepted requested and suggested CSW to call mother and daughter. CSW attempted to call patient's mother, Steward Drone, but there was no response and a voicemail could not be left. CSW called patient's daughter, Rexene Alberts, and informed her of patient's chosen disposition, which Bridgett expressed understanding of.   Expected Discharge Plan: Skilled Nursing Facility Barriers to Discharge: Continued Medical Work up  Expected Discharge Plan and Services In-house Referral: Clinical Social Work   Post Acute Care Choice: Skilled Nursing Facility Living arrangements for the past 2 months: Homeless Shelter                                       Social Determinants of Health (SDOH) Interventions SDOH Screenings   Food Insecurity: Patient Declined (12/13/2023)  Housing: Patient Declined (12/13/2023)  Transportation Needs: Patient Declined (12/13/2023)  Utilities: Patient Declined (12/13/2023)  Depression (PHQ2-9): Low Risk  (04/29/2022)  Tobacco Use: High Risk (12/13/2023)    Readmission Risk  Interventions    04/17/2022    8:36 AM  Readmission Risk Prevention Plan  Transportation Screening Complete  PCP or Specialist Appt within 3-5 Days Complete  HRI or Home Care Consult Complete  Social Work Consult for Recovery Care Planning/Counseling Complete  Palliative Care Screening Not Applicable  Medication Review Oceanographer) Complete

## 2023-12-20 NOTE — Progress Notes (Signed)
[facility] Progress Note   Renal-related home meds: - coreg 12.5 qd - sensipar 30 hs - renavite - revela 3 ac tid      [facility]  [facility]h  400/1.5   45kg  2/2 bath AVF  Heparin none - last OP HD 12/14, post wt 45.7kg - making it to her dry wt, occ under - mircera 30 q 4, last 11/25, due 12/23  - hectorol 6   Assessment/ Plan: Septic shock / klebsiella bacteremia - getting IV abx, BP's a little better. Started midodrine, continue at 5 tid.  HFrEF - per cards consult, LVEF 30-35% and severe AI / TR and moderate MR. Not a candidate for surgical repair. Cards recommended hospice consultation.  Atrial flutter - per cards. BP too low to add rate control meds ESRD - on HD TTS. HD Saturday with 1.4 L net UF. Next HD today on holiday schedule.  Fistulogram - R BBT AVF placed 2021, decreased access flows. 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  Strong  thrill in the right upper arm fistula BP - BP's soft, holding home coreg. Midodrine 5 tid, bp's improving a bit.  Volume - euvolemic on exam. CXR showed sig CM, question vasc congestion. Get stand wt when able. Not sure wts are accurate. Pt looks dry on exam.  Anemia of esrd - Hb here 13 - last 3 outpt Hb's were 11.8, 12.1 and 12.9 on 12/12. Close to baseline.  MBD ckd - CCa sig elevated ~ 11.4, holding hectorol for now. Cont sensipar and binders when eating. Check PTT.   H/o SLE - is on IV steroids per pmd GOC - discussions are ongoing w/ patient and daughter by the primary team  Subjective: seen in HD. BP's in 100s - 110s today. Remains feeling badly w/ pain allover and too weak to get up by herself.  Satting 97% on 1 L.  Vitals:   12/19/23 2115 12/19/23 2300 12/20/23 0338 12/20/23 0754  BP:  115/71 110/69 (!) 144/68  Pulse: (!) 131 (!) 107 (!) 126 (!) 108  Resp: 17 16 14  (!) 24  Temp:  (!) 97.5  F (36.4 C) (!) 97.5 F (36.4 C) (!) 97.5 F (36.4 C)  TempSrc:  Oral Oral Oral  SpO2: 98% 97% 96% 97%  Weight:      Height:        Exam: Gen thin adult female, [place] O2 Sclera anicteric, throat clear  No jvd or bruits Chest clear bilat to bases RRR no RG Abd soft ntnd no mass or ascites +bs Ext no LEedema Neuro is Ox3, nonfocal RUA AVF+thrill     Recent Labs  Lab 12/14/23 0231 12/15/23 0821 12/16/23 0737 12/17/23 0227 12/18/23 0214 12/19/23 0212 12/20/23 0210  HGB 12.8   < >  --  12.2  12.0   < > 11.8* 12.1  ALBUMIN 2.1*  --   --  2.3*  --   --   --   CALCIUM 9.8   < >  --  10.2   < > 9.8 10.1  PHOS 7.5*   < > 6.2* 5.5*  --   --   --   CREATININE 7.65*   < >  --  4.05*   < > 4.07* 5.35*  K 4.5   < >  --  4.0   < >  4.3 4.4   < > = values in this interval not displayed.   No results for input(s): "IRON", "TIBC", "FERRITIN" in the last 168 hours. Inpatient medications:  Chlorhexidine Gluconate Cloth  6 each Topical Q0600   ciprofloxacin  500 mg Oral Daily   feeding supplement (NEPRO CARB STEADY)  237 mL Oral TID WC   heparin  5,000 Units Subcutaneous Q8H   melatonin  3 mg Oral QHS   midodrine  5 mg Oral TID WC   nystatin  5 mL Oral QID   sodium chloride flush  10 mL Intravenous Q12H    albumin human 25 g (12/16/23 1138)   acetaminophen, albumin human, Gerhardt's butt cream, HYDROmorphone, loperamide

## 2023-12-20 NOTE — Progress Notes (Signed)
Pt has a 7:20 am chair time at [facility] on TTS. Will assist as needed.   Olivia Canter Renal Navigator [phone]

## 2023-12-20 NOTE — Progress Notes (Addendum)
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   Hospital Day 6  Patient feeling better today. She states she hasn't been spitting out her medications and is willing to participate in her care. Willing to go to a SNF once medically stable.   Objective:  Vital signs in last 24 hours: Vitals:   12/19/23 2115 12/19/23 2300 12/20/23 0338 12/20/23 0754  BP:  115/71 110/69 (!) 144/68  Pulse: (!) 131 (!) 107 (!) 126 (!) 108  Resp: 17 16 14  (!) 24  Temp:  (!) 97.5 F (36.4 C) (!) 97.5 F (36.4 C) (!) 97.5 F (36.4 C)  TempSrc:  Oral Oral Oral  SpO2: 98% 97% 96% 97%  Weight:      Height:       Supplemental O2: Nasal Cannula SpO2: 97 % O2 Flow Rate (L/min): 2 L/min  Physical Exam:  Constitutional: chronically ill appearing Cardiovascular: irregular rhythm, tachycardic Pulmonary/Chest: normal work of breathing on room air, lungs clear to auscultation bilaterally Abdominal: mildly tender to palpation  Pertinent Labs:    Latest Ref Rng & Units 12/20/2023    2:10 AM 12/19/2023    2:12 AM 12/18/2023    2:14 AM  CBC  WBC 4.0 - 10.5 K/uL 11.2  10.3  11.2   Hemoglobin 12.0 - 15.0 g/dL 78.2  95.6  21.3   Hematocrit 36.0 - 46.0 % 36.2  34.8  33.8   Platelets 150 - 400 K/uL 114  97  79        Latest Ref Rng & Units 12/20/2023    2:10 AM 12/19/2023    2:12 AM 12/18/2023    2:14 AM  CMP  Glucose 70 - 99 mg/dL 086  578  469   BUN 6 - 20 mg/dL 629  55  71   Creatinine 0.44 - 1.00 mg/dL 5.28  4.13  2.44   Sodium 135 - 145 mmol/L 134  134  136   Potassium 3.5 - 5.1 mmol/L 4.4  4.3  4.1   Chloride 98 - 111 mmol/L 93  95  98   CO2 22 - 32 mmol/L 23  23  25    Calcium 8.9 - 10.3 mg/dL 01.0  9.8  27.2     Assessment/Plan:   #Klebsiella bacteremia, resolved Sepsis is resolved. WBC 11.2 today, afebrile. She has completed 7 day  course of antibiotics for infection without continued foci of infection. Discontinuing antibiotics today.   #Atrial flutter Rates continue around 120-130. Initially thought to be due to bacteremia but has not improved. Probably due to underlying structural heart disease and cardiomyopathy. Risk of cardioversion outweigh benefits at this point. Too hypotensive for rate control. Eliquis restarted as bleeding risk seems minimal at this point.   #Chronic congestive heart failure With biventricular systolic failure. Remains relatively hypotensive. She does not appear overtly volume overloaded. Cardiology recommended hospice consultation. Stable.   #Hx lupus c/b nephritis Not on antirheumatic drugs outside of the hospital. Not suspicious of lupus flare at this time. Initially started pulse dose methylprednisolone but discontinued. Stable.   #Diarrhea Ruled out infectious causes, prn imodium  #ESRD Nephrology onboard, HD T/Th/S however will receive today due to holiday schedule  #Goals of care Family expressed concerns to nurse that patient has been spitting out medications. Unclear if  patient is motivated to continue treatment. Today patient stated she is amenable to going to a SNF. Goals of care discussion ongoing with daughter Marcelino Duster  Diet: Renal with fluid restriction VTE: Eliquis Code: Full  Dispo: Pending goals of care discussion  Monna Fam, MD PGY-1 Internal Medicine Resident Pager Number [phone] Please contact the on call pager after 5 pm and on weekends at [phone].

## 2023-12-21 ENCOUNTER — Other Ambulatory Visit (HOSPITAL_COMMUNITY): Payer: Self-pay

## 2023-12-21 ENCOUNTER — Telehealth (HOSPITAL_COMMUNITY): Payer: Self-pay | Admitting: Pharmacy Technician

## 2023-12-21 DIAGNOSIS — M3214 Glomerular disease in systemic lupus erythematosus: Secondary | ICD-10-CM | POA: Diagnosis not present

## 2023-12-21 DIAGNOSIS — R7881 Bacteremia: Secondary | ICD-10-CM | POA: Diagnosis not present

## 2023-12-21 DIAGNOSIS — D696 Thrombocytopenia, unspecified: Secondary | ICD-10-CM | POA: Diagnosis not present

## 2023-12-21 DIAGNOSIS — N186 End stage renal disease: Secondary | ICD-10-CM | POA: Diagnosis not present

## 2023-12-21 LAB — CBC
HCT: 37.4 % (ref 36.0–46.0)
Hemoglobin: 12.2 g/dL (ref 12.0–15.0)
MCH: 29.5 pg (ref 26.0–34.0)
MCHC: 32.6 g/dL (ref 30.0–36.0)
MCV: 90.3 fL (ref 80.0–100.0)
Platelets: 181 10*3/uL (ref 150–400)
RBC: 4.14 MIL/uL (ref 3.87–5.11)
RDW: 18.7 % — ABNORMAL HIGH (ref 11.5–15.5)
WBC: 10.2 10*3/uL (ref 4.0–10.5)
nRBC: 1.1 % — ABNORMAL HIGH (ref 0.0–0.2)

## 2023-12-21 LAB — BASIC METABOLIC PANEL
Anion gap: 14 (ref 5–15)
BUN: 69 mg/dL — ABNORMAL HIGH (ref 6–20)
CO2: 26 mmol/L (ref 22–32)
Calcium: 9.8 mg/dL (ref 8.9–10.3)
Chloride: 95 mmol/L — ABNORMAL LOW (ref 98–111)
Creatinine, Ser: 4.08 mg/dL — ABNORMAL HIGH (ref 0.44–1.00)
GFR, Estimated: 14 mL/min — ABNORMAL LOW (ref >60)
Glucose, Bld: 79 mg/dL (ref 70–99)
Potassium: 4.2 mmol/L (ref 3.5–5.1)
Sodium: 135 mmol/L (ref 135–145)

## 2023-12-21 LAB — SURGICAL PATHOLOGY

## 2023-12-21 LAB — ANCA TITERS
Atypical P-ANCA titer: <1:20 {titer}
C-ANCA: <1:20 {titer}
P-ANCA: <1:20 {titer}

## 2023-12-21 NOTE — Plan of Care (Signed)

## 2023-12-21 NOTE — Telephone Encounter (Signed)
Patient Product/process development scientist completed.    The patient is insured through [facility] MEDICAID.     Ran test claim for Eliquis 5 mg and the current 30 day co-pay is $4.00.   This test claim was processed through [facility]- copay amounts may vary at other pharmacies due to pharmacy/plan contracts, or as the patient moves through the different stages of their insurance plan.     Roland Earl, CPHT Pharmacy Technician III Certified Patient Advocate [facility] Patient Advocate Team Direct Number: [phone]  Fax: [phone]

## 2023-12-21 NOTE — Progress Notes (Signed)
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   Hospital Day 7  Patient feeling the same as yesterday. She reports some improvement with her swallowing since starting the nystatin. She would still like to go to a SNF today if possible. She has not discussed hospice with her daughter, recommended her talking with daughter today to initial goals of care discussion. Patient medically ready for discharge.   Objective:  Vital signs in last 24 hours: Vitals:   12/21/23 0250 12/21/23 0300 12/21/23 0347 12/21/23 0715  BP: 110/64 100/62 93/65 111/63  Pulse: (!) 54 (!) 32 100 (!) 123  Resp: 17 19 20 16   Temp:  (!) 97.5 F (36.4 C) 98 F (36.7 C) (!) 97.5 F (36.4 C)  TempSrc:  Axillary Axillary Axillary  SpO2: 100% 100% 100% 100%  Weight:      Height:       Supplemental O2: Nasal Cannula SpO2: 100 % O2 Flow Rate (L/min): 3 L/min  Physical Exam:  Constitutional: chronically ill appearing Cardiovascular: irregular rhythm, tachycardic Pulmonary/Chest: normal work of breathing on room air, lungs clear to auscultation bilaterally Abdominal: mildly tender to palpation  Pertinent Labs:    Latest Ref Rng & Units 12/21/2023    7:43 AM 12/20/2023    2:10 AM 12/19/2023    2:12 AM  CBC  WBC 4.0 - 10.5 K/uL 10.2  11.2  10.3   Hemoglobin 12.0 - 15.0 g/dL 34.7  42.5  95.6   Hematocrit 36.0 - 46.0 % 37.4  36.2  34.8   Platelets 150 - 400 K/uL 181  114  97        Latest Ref Rng & Units 12/21/2023    7:43 AM 12/20/2023    2:10 AM 12/19/2023    2:12 AM  CMP  Glucose 70 - 99 mg/dL 79  387  564   BUN 6 - 20 mg/dL 69  332  55   Creatinine 0.44 - 1.00 mg/dL 9.51  8.84  1.66   Sodium 135 - 145 mmol/L 135  134  134   Potassium 3.5 - 5.1 mmol/L 4.2  4.4  4.3   Chloride 98 - 111 mmol/L 95  93  95   CO2 22 - 32 mmol/L 26  23  23    Calcium  8.9 - 10.3 mg/dL 9.8  06.3  9.8     Assessment/Plan:   #Atrial flutter Rates continue around 120-130. Initially thought to be due to bacteremia but has not improved. Probably due to underlying structural heart disease and cardiomyopathy. Risk of cardioversion outweigh benefits at this point. Too hypotensive for rate control. Eliquis restarted as bleeding risk seems minimal at this point. Stable.   #Klebsiella bacteremia, resolved Sepsis is resolved. WBC 11.2 today, afebrile. 7 day course of antibiotics completed 12/20/23.   #Oral thrush Oral thrush noted 12/23 with minor difficulty swallowing. Nystatin swish and swallow started. Improvement in dysphagia and appearance of thrush today.   Stable conditions #ESRD: HD per nephrology.  #Diarrhea: Ruled out infectious causes, prn imodium #Hx lupus c/b nephritis: Not suspicious of lupus flare at this time. Completed 3 day course of pulse dose steroids #Chronic congestive heart failure: With biventricular systolic failure. Remains relatively hypotensive. She does not appear overtly volume overloaded.   Diet: Renal with fluid restriction  VTE: Eliquis Code: Full  Dispo: Stable for discharge to SNF, pending bed availability   Monna Fam, MD PGY-1 Internal Medicine Resident Pager Number [phone] Please contact the on call pager after 5 pm and on weekends at [phone].

## 2023-12-21 NOTE — Progress Notes (Signed)
Physical Therapy Treatment Patient Details Name: [name] MRN: [id number] DOB: Jun 29, 1981 Today's Date: 12/21/2023   History of Present Illness 42 y.o. female admitted 12/16 with SOB, sepsis. 12/18 basilic vein angioplasty. 12/20 RLE punch biopsy. PMhx: ESRD on HD T/T/S, lupus, HFrEF (EF 35-40%), anemia, polysubstance abuse, Rt SDH, seizures    PT Comments  Pt mobility limited by tachycardia up to 140s bpm with transfer to BSC/BED. Pt with noted DOE and onset of fatigue with standing and performing pericare requiring multiple rest breaks. Pt more interactive today and appreciative of PT assist. ACute PT to cont to follow.    If plan is discharge home, recommend the following: A lot of help with walking and/or transfers;A lot of help with bathing/dressing/bathroom;Assistance with cooking/housework;Direct supervision/assist for financial management;Assist for transportation;Supervision due to cognitive status;Direct supervision/assist for medications management   Can travel by private vehicle     No  Equipment Recommendations  Hospital bed;BSC/3in1    Recommendations for Other Services       Precautions / Restrictions Precautions Precautions: Fall;Other (comment) Precaution Comments: watch HR Restrictions Weight Bearing Restrictions Per Provider Order: No     Mobility  Bed Mobility Overal bed mobility: Needs Assistance Bed Mobility: Sit to Supine       Sit to supine: Contact guard assist   General bed mobility comments: increased time, HOB all the way elevated, HR increased to 145 bpm when trying to scoot back in the bed, SpO2  at 73% however poor pleth    Transfers Overall transfer level: Needs assistance Equipment used: None Transfers: Sit to/from Stand, Bed to chair/wheelchair/BSC Sit to Stand: Contact guard assist   Step pivot transfers: Contact guard assist       General transfer comment: pt received on 3n1 commode, pt stood and supported self on bed rail  while performing pericare, noted DOE, HR in 130s-140s, SPO2 in 70s, rebounded to 90s upon sitting on EOB    Ambulation/Gait               General Gait Details: deferred due to tachycardia with minimal exertion and noted DOE and drop in SpO2   Stairs             Wheelchair Mobility     Tilt Bed    Modified Rankin (Stroke Patients Only)       Balance Overall balance assessment: Needs assistance Sitting-balance support: Feet supported, No upper extremity supported Sitting balance-Leahy Scale: Fair     Standing balance support: Bilateral upper extremity supported Standing balance-Leahy Scale: Poor Standing balance comment: reliant on unilateral UE support                            Cognition Arousal: Alert Behavior During Therapy: Flat affect Overall Cognitive Status: No family/caregiver present to determine baseline cognitive functioning                                 General Comments: No family present, able to communicate needs although soft spoken, received on BSC attempting hygiene, appreciative of assist        Exercises      General Comments General comments (skin integrity, edema, etc.): pt with dressing over R groin with noted drainage, HR tachycardia up to 140s with transfer to BSC/Bed      Pertinent Vitals/Pain Pain Assessment Pain Assessment: Faces Faces Pain Scale: Hurts a little bit  Pain Location: generalized Pain Descriptors / Indicators: Grimacing, Sore    Home Living                          Prior Function            PT Goals (current goals can now be found in the care plan section) Acute Rehab PT Goals Patient Stated Goal: go home PT Goal Formulation: With patient/family Time For Goal Achievement: 12/31/23 Potential to Achieve Goals: Fair Progress towards PT goals: Not progressing toward goals - comment (limited by tachycardia)    Frequency    Min 1X/week      PT Plan       Co-evaluation              AM-PAC PT "6 Clicks" Mobility   Outcome Measure  Help needed turning from your back to your side while in a flat bed without using bedrails?: A Little Help needed moving from lying on your back to sitting on the side of a flat bed without using bedrails?: A Little Help needed moving to and from a bed to a chair (including a wheelchair)?: A Lot Help needed standing up from a chair using your arms (e.g., wheelchair or bedside chair)?: A Lot Help needed to walk in hospital room?: Total Help needed climbing 3-5 steps with a railing? : Total 6 Click Score: 12    End of Session Equipment Utilized During Treatment: Oxygen Activity Tolerance: Patient limited by fatigue;Other (comment) (limited by tachycardia) Patient left: with call bell/phone within reach;in bed Nurse Communication: Mobility status PT Visit Diagnosis: Other abnormalities of gait and mobility (R26.89);Muscle weakness (generalized) (M62.81)     Time: 3016-0109 PT Time Calculation (min) (ACUTE ONLY): 16 min  Charges:    $Therapeutic Activity: 8-22 mins PT General Charges $$ ACUTE PT VISIT: 1 Visit                     [name], PT, DPT Acute Rehabilitation Services Secure chat preferred Office #: [phone]    Iona Hansen 12/21/2023, 2:03 PM

## 2023-12-21 NOTE — TOC Progression Note (Addendum)
Transition of Care [facility]) - Progression Note    Patient Details  Name: [name] MRN: [id number] Date of Birth: 06-Sep-1981  [facility]) CM/SW Contact  Marliss Coots, LCSW Phone Number: 12/21/2023, 3:52 PM  Clinical Narrative:     3:52 PM CSW contacted [facility] regarding bed availability. [facility] confirmed bed availability for tomorrow. SNF stated that they submitted insurance authorization which is currently pending (#[id number]) but will need a 30-day letter of guarantee due to insurance.  Expected Discharge Plan: Skilled Nursing Facility Barriers to Discharge: Continued Medical Work up  Expected Discharge Plan and Services In-house Referral: Clinical Social Work   Post Acute Care Choice: Skilled Nursing Facility Living arrangements for the past 2 months: Homeless Shelter                                       Social Determinants of Health (SDOH) Interventions SDOH Screenings   Food Insecurity: Patient Declined (12/13/2023)  Housing: Patient Declined (12/13/2023)  Transportation Needs: Patient Declined (12/13/2023)  Utilities: Patient Declined (12/13/2023)  Depression (PHQ2-9): Low Risk  (04/29/2022)  Tobacco Use: High Risk (12/13/2023)    Readmission Risk Interventions    04/17/2022    8:36 AM  Readmission Risk Prevention Plan  Transportation Screening Complete  PCP or Specialist Appt within 3-5 Days Complete  HRI or Home Care Consult Complete  Social Work Consult for Recovery Care Planning/Counseling Complete  Palliative Care Screening Not Applicable  Medication Review Oceanographer) Complete

## 2023-12-21 NOTE — Progress Notes (Signed)
Received patient in bed to unit.  Alert and oriented.  Informed consent signed and in chart.   TX duration: 3:00  Patient tolerated well.  Transported back to the room  Alert, without acute distress.  Hand-off given to patient's nurse.   Access used: RUE AVF Access issues: None  Total UF removed: 2000 mL Medication(s) given: None Post HD VS: please data insert    12/21/23 0300  Vitals  Temp (!) 97.5 F (36.4 C)  Temp Source Axillary  BP 100/62  MAP (mmHg) 75  BP Location Left Arm  BP Method Automatic  Patient Position (if appropriate) Lying  Pulse Rate (!) 32  Pulse Rate Source Monitor  ECG Heart Rate (!) 104  Resp 19  Oxygen Therapy  SpO2 100 %  O2 Device Nasal Cannula  O2 Flow Rate (L/min) 3 L/min  Patient Activity (if Appropriate) In bed  Pulse Oximetry Type Continuous  Post Treatment  Dialyzer Clearance Lightly streaked  Hemodialysis Intake (mL) 0 mL  Liters Processed 72  Fluid Removed (mL) 2000 mL  Tolerated HD Treatment Yes  Post-Hemodialysis Comments Treatment completed and blood returned without issue.  AVG/AVF Arterial Site Held (minutes) 5 minutes (Gauze applied and secured with paper tape; hemostasis achieved.)  AVG/AVF Venous Site Held (minutes) 5 minutes (Gauze applied and secured with paper tape; hemostasis achieved.)  Note  Patient Observations Patient alert, no acute distress noted; patient stable for return transport.  Fistula / Graft Right Upper arm Arteriovenous fistula  Placement Date/Time: (c) 08/01/20 1610   Placed prior to admission: No  Orientation: Right  Access Location: Upper arm  Access Type: (c) Arteriovenous fistula  Site Condition No complications  Fistula / Graft Assessment Present;Thrill;Bruit  Status Flushed;Patent;Deaccessed  Drainage Description None      [name]

## 2023-12-21 NOTE — Discharge Summary (Incomplete)
Name: [name] MRN: [id number] DOB: 04/16/1981 42 y.o. PCP: [name], NP  Date of Admission: 12/13/2023  5:48 AM Date of Discharge: 12/22/23 Attending Physician: Dr. Ninetta Lights  Discharge Diagnosis: Principal Problem:   Bacteremia Active Problems:   Lupus (systemic lupus erythematosus) (HCC)   ESRD on dialysis (HCC)   Diarrhea   Chronic HFrEF (heart failure with reduced ejection fraction) (HCC)   Thrombocytopenia (HCC)   Atrial fibrillation with RVR (HCC)    Discharge Medications: Allergies as of 12/22/2023       Reactions   Cephalosporins Rash   To both keflex and cefazolin.  Potential bullous lesion from Ceftriaxone 11/2023.   Tobramycin Sulfate Swelling   Eye swelling   Vancomycin Swelling        Medication List     PAUSE taking these medications    doxercalciferol 4 MCG/2ML injection Wait to take this until your doctor or other care provider tells you to start again. Commonly known as: HECTOROL Use as recommended by nephrology       STOP taking these medications    Accu-Chek Guide test strip Generic drug: glucose blood   Accu-Chek Softclix Lancets lancets   acetaminophen 650 MG CR tablet Commonly known as: TYLENOL Replaced by: acetaminophen 500 MG tablet   blood glucose meter kit and supplies Kit   carvedilol 12.5 MG tablet Commonly known as: COREG   levETIRAcetam 500 MG tablet Commonly known as: KEPPRA   loperamide 2 MG tablet Commonly known as: IMODIUM A-D Replaced by: loperamide 2 MG capsule   ondansetron 4 MG disintegrating tablet Commonly known as: ZOFRAN-ODT   pantoprazole 40 MG tablet Commonly known as: PROTONIX       TAKE these medications    acetaminophen 500 MG tablet Commonly known as: TYLENOL Take 2 tablets (1,000 mg total) by mouth every 8 (eight) hours as needed for mild pain (pain score 1-3). Replaces: acetaminophen 650 MG CR tablet   albuterol (2.5 MG/3ML) 0.083% nebulizer solution Commonly known  as: PROVENTIL Take 3 mLs (2.5 mg total) by nebulization every 2 (two) hours as needed for wheezing or shortness of breath.   apixaban 2.5 MG Tabs tablet Commonly known as: ELIQUIS Take 1 tablet (2.5 mg total) by mouth 2 (two) times daily.   cinacalcet 30 MG tablet Commonly known as: SENSIPAR Take 30 mg by mouth every evening.   diphenhydrAMINE 25 MG tablet Commonly known as: BENADRYL Take 1 tablet (25 mg total) by mouth every 4 (four) hours as needed for itching or allergies. What changed: when to take this   feeding supplement (NEPRO CARB STEADY) Liqd Take 237 mLs by mouth 3 (three) times daily with meals. What changed:  when to take this additional instructions   Gerhardt's butt cream Crea Apply 1 Application topically as needed for irritation.   loperamide 2 MG capsule Commonly known as: IMODIUM Take 1 capsule (2 mg total) by mouth as needed for diarrhea or loose stools. Replaces: loperamide 2 MG tablet   melatonin 3 MG Tabs tablet Take 1 tablet (3 mg total) by mouth at bedtime.   midodrine 5 MG tablet Commonly known as: PROAMATINE Take 1 tablet (5 mg total) by mouth 3 (three) times daily with meals.   multivitamin Tabs tablet Take 1 tablet by mouth daily.   nystatin 100000 UNIT/ML suspension Commonly known as: MYCOSTATIN Take 5 mLs (500,000 Units total) by mouth 4 (four) times daily.   sevelamer carbonate 800 MG tablet Commonly known as: RENVELA Take 2,400 mg  by mouth 3 (three) times daily.        Disposition and follow-up:   [name] was discharged from [facility] in Stable condition.  At the hospital follow up visit please address:  1.  Follow-up:  - Aflutter: beta blocker was held due to low blood pressure. Consider adding on low dose beta blocker if tachycardia worsens or if blood pressure improves   - Goals of care: hospice was brought up during admission, patient seems unaware of the severity of her various medical  conditions. She will need further goals of care discussion, possibly outpatient palliative referral  4.  Medication Changes  Started: Eliquis 2.5mg  BID, Loperamide, Midodrine 5mg , Nystatin oral suspension  Stopped: Pantoprazole, Ondansetron, Keppra, Coreg  Follow-up Appointments: Per PCP  Hospital Course by problem list:   Septic Shock due to  Klebsiella pneumoniae bacteremia Patient presented to the [facility] ED with hypotension, tachycardia and respiratory distress found to be in new onset atrial flutter. Sepsis workup initiated in the ED, and she was given empiric IV ceftriaxone and azithromycin. Lactic acid peaked at 5.4 and down trended to 3.9. WBC WNL during admission. Blood cultures grew Klebsiella, so ceftriaxone was continued and azithromycin was discontinued as we waited for susceptibilities to return. Patient developed bullous lesion on hospital day 2. Concerned for drug reaction given patient's history of cephalosporin allergy, ceftriaxone transitioned to ciprofloxacin. Patient continued to remain tachycardic and hypotensive. Patient completed 5 day course of ciprofloxacin with clinical improvement, though remained tachycardic.   Atrial Flutter New onset atrial flutter on admission. Concerned if this caused the hypotension and SOB; however, once infection source confirmed, determined to be less likely. Cardiology evaluated the patient and recommended continued supportive care. Patient continued to exhibit 2:1 atrial flutter during admission.   R Leg Bullous Lesion  On hospital day 2, patient developed a bullous lesion on R thigh. Lesion worsened with development of two additional lesions on same thigh with evidence of skin sloughing. Originally, considered drug reaction vs active lupus flare vs infectious etiology. Patient has hx of rashes with keflex and cefazolin, so concerned for drug rash. Ceftriaxone was transitioned to cipro and lesion continued to worsen. Active lupus flare  suspected, so lupus labs ordered and 3 day course of IV pulse steroids completed.   HFrEF EF 35-40% in 2023 with mild aortic stenosis and severe aortic regurgitation. CXR consistent with pulmonary edema and BNP was 4500 on admission with a euvolemic physical exam. Echo revealed during admission revealed EF of 30-35%, LV and RV decreased function. Dilation of all four chambers. Significant valvular disease. Cardiology consulted and advised that she is not a candidate for surgery and recommended hospice consultation if she becomes symptomatic.   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 Nephrology consulted and recommended holding home coreg and hectorol and continuing sensipar and binders when eating during admission.   Substance Abuse Patient's last cocaine use on 12/12 prior to admission. Transition of Care was consulted.   Lupus Per chart review, patient followed by Dr. Kellie Simmering previously for SLE but has not followed up recently. In 2014, documentation stated she had not seen a Rheumatologist in over 5 years. 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 With developing R thigh bullous lesion, concern for lupus flare. 3 day course of pulse dose steroids  completed. Rheumatology at [facility] consulted. Blood cultures ordered to ensure bacteria cleared before additional lupus medications to bo given.   Anemia of Chronic Disease Hb ranged from 12.0-13.9. Baseline ~11. Stable during admission.   Thrombocytopenia Platelets declined from 103 on admission to 61. Thought to be due to sepsis versus active lupus flare. Platelet levels stable on discharge.     Discharge Subjective: Patient feeling a little better than yesterday. She reports some improvement with her  swallowing since starting the nystatin. She would still like to go to a SNF today if possible. She has not discussed hospice with her daughter, recommended her talking with daughter today to initial goals of care discussion. Patient medically ready for discharge.   Discharge Exam:   BP 107/76   Pulse (!) 132   Temp 97.7 F (36.5 C) (Oral)   Resp 16   Ht 5\' 3"  (1.6 m)   Wt 57 kg   SpO2 100%   BMI 22.26 kg/m  Constitutional: chronically-ill appearing, in no acute distress HENT: white plaques on tongue, thrush Eyes: conjunctiva non-erythematous Neck: supple Cardiovascular: regular rate and rhythm, no m/r/g. Right brachial AV fistula with palpable thrill Pulmonary/Chest: normal work of breathing on room air, lungs clear to auscultation bilaterally Abdominal: soft, non-tender, non-distended MSK: normal bulk and tone Neurological: alert & oriented x 3 Skin: warm and dry  Pertinent Labs, Studies, and Procedures:     Latest Ref Rng & Units 12/21/2023    7:43 AM 12/20/2023    2:10 AM 12/19/2023    2:12 AM  CBC  WBC 4.0 - 10.5 K/uL 10.2  11.2  10.3   Hemoglobin 12.0 - 15.0 g/dL 69.6  78.9  38.1   Hematocrit 36.0 - 46.0 % 37.4  36.2  34.8   Platelets 150 - 400 K/uL 181  114  97        Latest Ref Rng & Units 12/22/2023    2:14 AM 12/21/2023    7:43 AM 12/20/2023    2:10 AM  CMP  Glucose 70 - 99 mg/dL 86  79  017   BUN 6 - 20 mg/dL 98  69  510   Creatinine 0.44 - 1.00 mg/dL 2.58  5.27  7.82   Sodium 135 - 145 mmol/L 135  135  134   Potassium 3.5 - 5.1 mmol/L 4.2  4.2  4.4   Chloride 98 - 111 mmol/L 94  95  93   CO2 22 - 32 mmol/L 26  26  23    Calcium 8.9 - 10.3 mg/dL 9.8  9.8  42.3     ECHOCARDIOGRAM COMPLETE Result Date: 12/14/2023    ECHOCARDIOGRAM REPORT   Patient Name:   [name] Date of Exam: 12/14/2023 Medical Rec #:  [id number]      Height:       63.0 in Accession #:    [id number]     Weight:       122.8 lb Date of Birth:  26-Jun-1981     BSA:          1.572 m  Patient Age:    42 years       BP:           85/56 mmHg Patient Gender: F              HR:           112 bpm. Exam Location:  Inpatient Procedure: 2D Echo, Color Doppler and Cardiac Doppler Indications:  CHF I50.21  History:        Patient has prior history of Echocardiogram examinations, most                 recent 11/30/2022. CHF. Polysubstance abuse, Lupus.  Sonographer:    Harriette Bouillon RDCS Referring Phys: Derwood Kaplan ATWAY IMPRESSIONS  1. Left ventricular ejection fraction, by estimation, is 30 to 35%. The left ventricle has moderately decreased function. The left ventricle demonstrates global hypokinesis. The left ventricular internal cavity size was mildly dilated. Left ventricular diastolic parameters are indeterminate.  2. Right ventricular systolic function is moderately reduced. The right ventricular size is mildly enlarged. There is normal pulmonary artery systolic pressure. The estimated right ventricular systolic pressure is 35.0 mmHg.  3. Left atrial size was severely dilated.  4. Right atrial size was moderately dilated.  5. Thickened and calcified mitral chords, severely calcified leaflets. The mitral valve is rheumatic. Moderate mitral valve regurgitation. Mild mitral stenosis. The mean mitral valve gradient is 4.0 mmHg. Moderate mitral annular calcification.  6. Tricuspid valve regurgitation is moderate to severe. Visually moderate TR but hepatic vein systolic flow reversal suggests possible severe TR  7. The aortic valve is tricuspid. There is moderate calcification of the aortic valve. Aortic valve regurgitation is moderate to severe, highly eccentric. Aortic valve sclerosis/calcification is present, without any evidence of aortic stenosis.  8. The inferior vena cava is dilated in size with >50% respiratory variability, suggesting right atrial pressure of 8 mmHg. FINDINGS  Left Ventricle: Left ventricular ejection fraction, by estimation, is 30 to 35%. The left ventricle has moderately decreased  function. The left ventricle demonstrates global hypokinesis. The left ventricular internal cavity size was mildly dilated. There is no left ventricular hypertrophy. Left ventricular diastolic parameters are indeterminate. Right Ventricle: The right ventricular size is mildly enlarged. No increase in right ventricular wall thickness. Right ventricular systolic function is moderately reduced. There is normal pulmonary artery systolic pressure. The tricuspid regurgitant velocity is 2.60 m/s, and with an assumed right atrial pressure of 8 mmHg, the estimated right ventricular systolic pressure is 35.0 mmHg. Left Atrium: Left atrial size was severely dilated. Right Atrium: Right atrial size was moderately dilated. Pericardium: There is no evidence of pericardial effusion. Mitral Valve: Thickened and calcified mitral chords, severely calcified leaflets. The mitral valve is rheumatic. There is severe calcification of the mitral valve leaflet(s). Moderate mitral annular calcification. Moderate mitral valve regurgitation. Mild mitral valve stenosis. The mean mitral valve gradient is 4.0 mmHg. Tricuspid Valve: The tricuspid valve is normal in structure. Tricuspid valve regurgitation is moderate to severe. The flow in the hepatic veins is reversed during ventricular systole. Aortic Valve: The aortic valve is tricuspid. There is moderate calcification of the aortic valve. Aortic valve regurgitation is moderate to severe. Aortic valve sclerosis/calcification is present, without any evidence of aortic stenosis. Pulmonic Valve: The pulmonic valve was normal in structure. Pulmonic valve regurgitation is trivial. Aorta: The aortic root is normal in size and structure. Venous: The inferior vena cava is dilated in size with greater than 50% respiratory variability, suggesting right atrial pressure of 8 mmHg. IAS/Shunts: No atrial level shunt detected by color flow Doppler.  LEFT VENTRICLE PLAX 2D LVIDd:         5.70 cm LVIDs:          4.80 cm LV PW:         1.00 cm LV IVS:        1.00 cm LVOT diam:  2.00 cm LV SV:         47 LV SV Index:   30 LVOT Area:     3.14 cm  LV Volumes (MOD) LV vol d, MOD A4C: 176.0 ml LV vol s, MOD A4C: 142.0 ml LV SV MOD A4C:     176.0 ml RIGHT VENTRICLE            IVC RV S prime:     8.70 cm/s  IVC diam: 2.00 cm TAPSE (M-mode): 0.8 cm LEFT ATRIUM              Index        RIGHT ATRIUM           Index LA diam:        4.40 cm  2.80 cm/m   RA Area:     26.50 cm LA Vol (A2C):   134.0 ml 85.26 ml/m  RA Volume:   98.40 ml  62.61 ml/m LA Vol (A4C):   120.0 ml 76.36 ml/m LA Biplane Vol: 133.0 ml 84.63 ml/m  AORTIC VALVE LVOT Vmax:   100.00 cm/s LVOT Vmean:  67.000 cm/s LVOT VTI:    0.149 m  AORTA Ao Root diam: 3.00 cm Ao Asc diam:  2.80 cm MITRAL VALVE           TRICUSPID VALVE MV Mean grad: 4.0 mmHg TR Peak grad:   27.0 mmHg                        TR Vmax:        260.00 cm/s                         SHUNTS                        Systemic VTI:  0.15 m                        Systemic Diam: 2.00 cm Dalton McleanMD Electronically signed by Wilfred Lacy Signature Date/Time: 12/14/2023/2:18:46 PM    Final    DG Chest Port 1 View Result Date: 12/13/2023 CLINICAL DATA:  Evaluate for sepsis. EXAM: PORTABLE CHEST 1 VIEW COMPARISON:  12/01/2022 FINDINGS: Interval removal of endotracheal tube and enteric tube. Stable cardiac enlargement. Aortic atherosclerosis. Persistent diffuse increase interstitial markings. Persistent small left pleural effusion. No new findings. Visualized osseous structures appear intact. IMPRESSION: 1. Interval removal of endotracheal tube and enteric tube. 2. Persistent diffuse increase interstitial markings compatible with pulmonary edema. 3. Persistent small left pleural effusion. Electronically Signed   By: Signa Kell M.D.   On: 12/13/2023 06:32   Signed: Monna Fam, MD PGY-1 12/22/2023, 9:49 AM   Pager: [phone]

## 2023-12-21 NOTE — Consult Note (Signed)
WOC Nurse wound follow up Wound type: Blister disrupt. Assessed photos and discussed the case with the nurse. Pt has history of Lupus. The blister are look the same as the last consult 12/20.  Dressing procedure/placement/frequency: Cover with foam dressing, change 3Q.  WOC team will not plan to follow further.  Please reconsult if further assistance is needed. Thank-you,  Denyse Amass BSN, RN, [employer], WOC  (Pager: [phone])

## 2023-12-21 NOTE — Progress Notes (Signed)
[facility] Progress Note   Renal-related home meds: - coreg 12.5 qd - sensipar 30 hs - renavite - revela 3 ac tid      [facility]  [facility]h  400/1.5   45kg  2/2 bath AVF  Heparin none - last OP HD 12/14, post wt 45.7kg - making it to her dry wt, occ under - mircera 30 q 4, last 11/25, due 12/23  - hectorol 6   Assessment/ Plan: Septic shock / klebsiella bacteremia - getting IV abx, BP's a little better. Started midodrine, continue at 5 tid.  HFrEF - per cards consult, LVEF 30-35% and severe AI / TR and moderate MR. Not a candidate for surgical repair. Cards recommended hospice consultation.  Atrial flutter - per cards. BP too low to add rate control meds ESRD - on HD TTS. HD Saturday with 1.4 L net UF.  Tolerated HD overnight with 2 L net UF; patient appears to be very comfortable.    Fistulogram - R BBT AVF placed 2021, decreased access flows. 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  Strong  thrill in the right upper arm fistula BP - BP's soft, holding home coreg. Midodrine 5 tid, bp's improving a bit.  Volume - euvolemic on exam. CXR showed sig CM, question vasc congestion. Get stand wt when able. Not sure wts are accurate. Pt looks dry on exam.  Anemia of esrd - Hb here 13 - last 3 outpt Hb's were 11.8, 12.1 and 12.9 on 12/12. Close to baseline.  MBD ckd - CCa sig elevated ~ 11.4, holding hectorol for now. Cont sensipar and binders when eating. Check PTT.   H/o SLE - is on IV steroids per pmd GOC - discussions are ongoing w/ patient and daughter by the primary team  Subjective: seen in HD. BP's in 93-100's/60's /24hrs. Pain all over and too weak to get up by herself.  Satting 97% on 1 L.  Vitals:   12/21/23 0250 12/21/23 0300 12/21/23 0347 12/21/23 0715  BP: 110/64 100/62 93/65 111/63  Pulse: (!) 54 (!) 32 100 (!) 123  Resp: 17 19 20 16    Temp:  (!) 97.5 F (36.4 C) 98 F (36.7 C) (!) 97.5 F (36.4 C)  TempSrc:  Axillary Axillary Axillary  SpO2: 100% 100% 100% 100%  Weight:      Height:        Exam: Gen thin adult female, [place] O2 Sclera anicteric, throat clear  No jvd or bruits Chest clear bilat to bases RRR no RG Abd soft ntnd no mass or ascites +bs Ext no LEedema Neuro is Ox3, nonfocal RUA AVF+thrill     Recent Labs  Lab 12/16/23 0737 12/17/23 0227 12/18/23 0214 12/20/23 0210 12/21/23 0743  HGB  --  12.2  12.0   < > 12.1 12.2  ALBUMIN  --  2.3*  --   --   --   CALCIUM  --  10.2   < > 10.1 9.8  PHOS 6.2* 5.5*  --   --   --   CREATININE  --  4.05*   < > 5.35* 4.08*  K  --  4.0   < > 4.4 4.2   < > = values in this interval not displayed.   No results for input(s): "IRON", "TIBC", "FERRITIN" in the last 168 hours. Inpatient medications:  apixaban  2.5 mg Oral BID   Chlorhexidine Gluconate Cloth  6 each Topical Q0600   feeding supplement (NEPRO CARB STEADY)  237 mL Oral TID WC   melatonin  3 mg Oral QHS   midodrine  5 mg Oral TID WC   nystatin  5 mL Oral QID   sodium chloride flush  10 mL Intravenous Q12H    albumin human 25 g (12/16/23 1138)   acetaminophen, albumin human, Gerhardt's butt cream, HYDROmorphone, loperamide

## 2023-12-22 ENCOUNTER — Inpatient Hospital Stay (HOSPITAL_COMMUNITY): Payer: Medicaid Other

## 2023-12-22 LAB — BASIC METABOLIC PANEL WITH GFR
Anion gap: 15 (ref 5–15)
BUN: 98 mg/dL — ABNORMAL HIGH (ref 6–20)
CO2: 26 mmol/L (ref 22–32)
Calcium: 9.8 mg/dL (ref 8.9–10.3)
Chloride: 94 mmol/L — ABNORMAL LOW (ref 98–111)
Creatinine, Ser: 5.34 mg/dL — ABNORMAL HIGH (ref 0.44–1.00)
GFR, Estimated: 10 mL/min — ABNORMAL LOW
Glucose, Bld: 86 mg/dL (ref 70–99)
Potassium: 4.2 mmol/L (ref 3.5–5.1)
Sodium: 135 mmol/L (ref 135–145)

## 2023-12-22 LAB — CULTURE, BLOOD (ROUTINE X 2)
Culture: NO GROWTH
Culture: NO GROWTH

## 2023-12-22 MED ORDER — ACETAMINOPHEN 500 MG PO TABS: 1000.0000 mg | ORAL_TABLET | Freq: Three times a day (TID) | ORAL | 0 refills | Status: DC | PRN

## 2023-12-22 MED ORDER — NEPRO/CARBSTEADY PO LIQD: 237.0000 mL | Freq: Three times a day (TID) | ORAL | Status: AC

## 2023-12-22 MED ORDER — LOPERAMIDE HCL 2 MG PO CAPS: 2.0000 mg | ORAL_CAPSULE | ORAL | Status: DC | PRN

## 2023-12-22 MED ORDER — GERHARDT'S BUTT CREAM: 1.0000 | TOPICAL_CREAM | CUTANEOUS | Status: DC | PRN

## 2023-12-22 MED ORDER — MIDODRINE HCL 5 MG PO TABS: 5.0000 mg | ORAL_TABLET | Freq: Three times a day (TID) | ORAL | Status: DC

## 2023-12-22 MED ORDER — NYSTATIN 100000 UNIT/ML MT SUSP: 5.0000 mL | Freq: Four times a day (QID) | OROMUCOSAL | Status: DC

## 2023-12-22 MED ORDER — APIXABAN 2.5 MG PO TABS: 2.5000 mg | ORAL_TABLET | Freq: Two times a day (BID) | ORAL | Status: DC

## 2023-12-22 MED ORDER — MELATONIN 3 MG PO TABS: 3.0000 mg | ORAL_TABLET | Freq: Every day | ORAL | Status: DC

## 2023-12-22 NOTE — Progress Notes (Signed)
[facility] Progress Note   Subjective: Seen sleeping in room. Has draining wounds R upper thigh. Denies SOB. Aflutter with high HR noted on monitor. Patient is asymptomatic.     Objective Vitals:   12/22/23 0400 12/22/23 0500 12/22/23 0522 12/22/23 0700  BP: 105/68 107/76    Pulse: (!) 137  (!) 135 (!) 132  Resp: 11 (!) 29 16   Temp:    97.7 F (36.5 C)  TempSrc:    Oral  SpO2: 100% 100% 100%   Weight:      Height:       Physical Exam General: Chronically ill female in NAD Heart: S1,S2 no M/R/G AFlutter on monitor rate 130s  Lungs: CTAB A/P No WOB  Abdomen: soft, NABS Extremities:No LE edema Dialysis Access: R AVF with scabs + T/B   Additional Objective Labs: Basic Metabolic Panel: Recent Labs  Lab 12/15/23 0821 12/16/23 0209 12/16/23 0737 12/17/23 0227 12/18/23 0214 12/20/23 0210 12/21/23 0743 12/22/23 0214  NA 133*   < >  --  135   < > 134* 135 135  K 4.8   < >  --  4.0   < > 4.4 4.2 4.2  CL 97*   < >  --  95*   < > 93* 95* 94*  CO2 23   < >  --  26   < > 23 26 26   GLUCOSE 120*   < >  --  197*   < > 154* 79 86  BUN 44*   < >  --  32*   < > 103* 69* 98*  CREATININE 5.31*   < >  --  4.05*   < > 5.35* 4.08* 5.34*  CALCIUM 9.8   < >  --  10.2   < > 10.1 9.8 9.8  PHOS 5.5*  --  6.2* 5.5*  --   --   --   --    < > = values in this interval not displayed.   Liver Function Tests: Recent Labs  Lab 12/17/23 0227  ALBUMIN 2.3*   No results for input(s): "LIPASE", "AMYLASE" in the last 168 hours. CBC: Recent Labs  Lab 12/17/23 0227 12/18/23 0214 12/19/23 0212 12/20/23 0210 12/21/23 0743  WBC 6.1  5.8 11.2* 10.3 11.2* 10.2  NEUTROABS 5.3  --   --   --   --   HGB 12.2  12.0 11.3* 11.8* 12.1 12.2  HCT 37.1  36.8 33.8* 34.8* 36.2 37.4  MCV 90.7  89.3 88.7 87.4 90.0 90.3  PLT 62*  61* 79* 97* 114* 181   Blood Culture    Component Value Date/Time   SDES BLOOD LEFT HAND 12/17/2023 1507   SPECREQUEST  12/17/2023 1507    BOTTLES DRAWN  AEROBIC ONLY Blood Culture results may not be optimal due to an inadequate volume of blood received in culture bottles   CULT  12/17/2023 1507    NO GROWTH 5 DAYS Performed at [facility], [address] [place], [place] [address]    REPTSTATUS 12/22/2023 FINAL 12/17/2023 1507    Cardiac Enzymes: No results for input(s): "CKTOTAL", "CKMB", "CKMBINDEX", "TROPONINI" in the last 168 hours. CBG: No results for input(s): "GLUCAP" in the last 168 hours. Iron Studies: No results for input(s): "IRON", "TIBC", "TRANSFERRIN", "FERRITIN" in the last 72 hours. @lablastinr3 @ Studies/Results: DG CHEST PORT 1 VIEW Result Date: 12/22/2023 CLINICAL DATA:  42 year old female with hypoxia. EXAM: PORTABLE CHEST 1 VIEW COMPARISON:  CT Abdomen  and Pelvis 12/15/2023. Portable chest 12/13/2023. FINDINGS: Portable AP upright view at 0339 hours. Stable moderate to severe cardiomegaly. Calcified aortic atherosclerosis. Other mediastinal contours are within normal limits. Visualized tracheal air column is within normal limits. Evidence of ongoing small left pleural effusion, and left lung base hypo ventilation has progressed since 12/13/2023. Stable reticulonodular increased pulmonary interstitium elsewhere, likely interstitial edema. No pneumothorax. Right lung ventilation is stable. No acute osseous abnormality identified. IMPRESSION: 1. Stable pronounced cardiomegaly, pulmonary interstitial edema. 2. But left lung base opacification has progressed since 12/13/2023, probably a combination atelectasis and pleural effusion. Electronically Signed   By: [name] M.D.   On: 12/22/2023 03:56   Medications:  albumin human 25 g (12/16/23 1138)    apixaban  2.5 mg Oral BID   Chlorhexidine Gluconate Cloth  6 each Topical Q0600   feeding supplement (NEPRO CARB STEADY)  237 mL Oral TID WC   melatonin  3 mg Oral QHS   midodrine  5 mg Oral TID WC   nystatin  5 mL Oral QID   sodium chloride flush  10 mL Intravenous Q12H       Renal-related home meds: - coreg 12.5 qd - sensipar 30 hs - renavite - revela 3 ac tid      [facility]  [facility]  400/1.5   45kg  2/2 bath AVF  Heparin none - last OP HD 12/14, post wt 45.7kg - making it to her dry wt, occ under - mircera 30 q 4, last 11/25, due 12/23  - hectorol 6   Assessment/ Plan: Septic shock / klebsiella bacteremia - getting IV abx, BP's a little better. Started midodrine, continue at 5 tid.  HFrEF - per cards consult, LVEF 30-35% and severe AI / TR and moderate MR. Not a candidate for surgical repair. Cards recommended hospice consultation.  Atrial flutter - per cards. BP too low to add rate control meds ESRD - on HD TTS. Next HD 12/22/2020 Fistulogram - R BBT AVF placed 2021, decreased access flows. 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  Strong  thrill in the right upper arm fistula BP - BP's soft, holding home coreg. Increase Midodrine to 10 mg TID.  Volume - euvolemic on exam. CXR showed sig CM, question vasc congestion. Get stand wt when able. Not sure wts are accurate. Pt looks dry on exam.  Anemia of esrd - Hb here 13 - last 3 outpt Hb's were 11.8, 12.1 and 12.9 on 12/12. Close to baseline.  MBD ckd - CCa sig elevated ~ 11.4, holding hectorol for now. Cont sensipar and binders when eating. Check PTT.   H/o SLE - is on IV steroids per pmd GOC - discussions are ongoing w/ patient and daughter by the primary team  Trapper Meech H. Ilynn Stauffer NP-C 12/22/2023, 8:18 AM  [employer] [phone]

## 2023-12-22 NOTE — Discharge Planning (Signed)
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   Hectorol/Calcitriol change: Yes corrected calcium 11.2 Decrease hectorol to 3 mcg IV three times per week   Discharge Labs: Calcium 9.8 Phosphorus 5.5 Albumin 2.3 K+ 4.2  IV Antibiotics: No Details:  On Coumadin?: No Last INR: Next INR: Managed By:   OTHER/APPTS/LAB ORDERS: Please send order to SNF to increase midodrine to 10 mg PO TID verbal order Alonna Buckler AGNP-C    D/C Meds to be reconciled by nurse after every discharge.  Completed By: Alonna Buckler [facility] [phone]    Reviewed by: MD:______ RN_______

## 2023-12-22 NOTE — TOC CM/SW Note (Deleted)
Transition of Care [facility]) - Inpatient Brief Assessment   Patient Details  Name: [name] MRN: [id number] Date of Birth: December 08, 1981  [facility]) CM/SW Contact:    Marliss Coots, LCSW Phone Number: 12/22/2023, 10:08 AM   Clinical Narrative:  10:09 AM CSW confirmed discharge plans with patient at bedside. Patient expressed understanding of possible copay from ambulance transportation and continued to request ambulance transportation for discharge. CSW offered to call family/friends to notify them of discharge plans. Patient requested CSW to call her mother.  Transition of Care Asessment: Insurance and Status: Insurance coverage has been reviewed Patient has primary care physician: Yes Home environment has been reviewed: Resides with mother   Prior/Current Home Services: No current home services Social Drivers of Health Review: SDOH reviewed needs interventions Readmission risk has been reviewed: Yes Transition of care needs: transition of care needs identified, TOC will continue to follow

## 2023-12-22 NOTE — TOC Progression Note (Signed)
Transition of Care [facility]) - Progression Note    Patient Details  Name: [name] MRN: [id number] Date of Birth: [birth year]  Transition [facility] Care [facility]) CM/SW Contact  Marliss Coots, LCSW Phone Number: 12/22/2023, 10:14 AM  Clinical Narrative:     10:14 AM CSW confirmed discharge plans with patient at bedside. Patient expressed understanding of possible copay from ambulance transportation and continued to request ambulance transportation for discharge. CSW offered to call family/friends to notify them of discharge plans. Patient requested CSW to call her mother. CSW attempted to call patient's mother, Steward Drone, twice but there was no response and a voicemail could not be left.  Expected Discharge Plan: Skilled Nursing Facility Barriers to Discharge: Barriers Resolved  Expected Discharge Plan and Services In-house Referral: Clinical Social Work   Post Acute Care Choice: Skilled Nursing Facility Living arrangements for the past 2 months: Homeless Shelter Expected Discharge Date: 12/22/23                                     Social Determinants of Health (SDOH) Interventions SDOH Screenings   Food Insecurity: Patient Declined (12/13/2023)  Housing: Patient Declined (12/13/2023)  Transportation Needs: Patient Declined (12/13/2023)  Utilities: Patient Declined (12/13/2023)  Depression (PHQ2-9): Low Risk  (04/29/2022)  Tobacco Use: High Risk (12/13/2023)    Readmission Risk Interventions    04/17/2022    8:36 AM  Readmission Risk Prevention Plan  Transportation Screening Complete  PCP or Specialist Appt within 3-5 Days Complete  HRI or Home Care Consult Complete  Social Work Consult for Recovery Care Planning/Counseling Complete  Palliative Care Screening Not Applicable  Medication Review [employer]) Complete

## 2023-12-22 NOTE — Progress Notes (Signed)
Tried to give report Nurse at [facility] but no success. RN at [facility] is at break and will back for report. Did let them know that patient is headed their way.

## 2023-12-22 NOTE — TOC Transition Note (Addendum)
Transition of Care [facility]) - Discharge Note   Patient Details  Name: [name] MRN: [id number] Date of Birth: Feb 25, 1981  Transition [facility] Care [facility]) CM/SW Contact:  Marliss Coots, LCSW Phone Number: 12/22/2023, 10:26 AM   Clinical Narrative:     Patient will DC to: [facility] Anticipated DC date: 12/22/2023 Family notified: [name]; Mother; [phone] Transport by: Sharin Mons   Per MD patient ready for DC to [facility]. RN to call report prior to discharge ([phone]). RN, patient, patient's family, and facility notified of DC. Discharge Summary and FL2 sent to facility. DC packet on chart. Ambulance transport requested for patient. CSW submitted 30-day letter of guarantee form to [facility] admin, Inetta Fermo, and [facility] Transitions of Care Supervisor, Macario Golds, to review and accept.  CSW will sign off for now as social work intervention is no longer needed. Please consult [name] again if new needs arise.    Final next level of care: Skilled Nursing Facility Barriers to Discharge: Barriers Resolved   Patient Goals and CMS Choice Patient states their goals for this hospitalization and ongoing recovery are:: [facility].gov Compare Post Acute Care list provided to:: Patient Choice offered to / list presented to : Patient     Discharge Plan and Services Additional resources added to the After Visit Summary for   In-house Referral: Clinical Social Work   Post Acute Care Choice: Skilled Nursing Facility                               Social Drivers of Health (SDOH) Interventions SDOH Screenings   Food Insecurity: Patient Declined (12/13/2023)  Housing: Patient Declined (12/13/2023)  Transportation Needs: Patient Declined (12/13/2023)  Utilities: Patient Declined (12/13/2023)  Depression (PHQ2-9): Low Risk  (04/29/2022)  Tobacco Use: High Risk (12/13/2023)     Readmission Risk Interventions    04/17/2022    8:36 AM  Readmission Risk  Prevention Plan  Transportation Screening Complete  PCP or Specialist Appt within 3-5 Days Complete  HRI or Home Care Consult Complete  Social Work Consult for Recovery Care Planning/Counseling Complete  Palliative Care Screening Not Applicable  Medication Review Oceanographer) Complete

## 2023-12-23 NOTE — Progress Notes (Signed)
Late Note Entry- Dec 23, 2023  Pt was d/c to snf yesterday. Contacted [facility] this morning to be advised of pt's d/c date and that pt should have resumed care this am. Clinic also advised pt d/c to snf and name provided.   Olivia Canter Renal Navigator [phone]

## 2023-12-31 ENCOUNTER — Other Ambulatory Visit (HOSPITAL_COMMUNITY): Payer: Self-pay

## 2024-01-05 ENCOUNTER — Encounter (HOSPITAL_BASED_OUTPATIENT_CLINIC_OR_DEPARTMENT_OTHER): Payer: Self-pay

## 2024-01-19 ENCOUNTER — Encounter: Payer: Medicaid Other | Admitting: Family

## 2024-01-19 NOTE — Progress Notes (Signed)
 Erroneous encounter-disregard

## 2024-01-23 ENCOUNTER — Inpatient Hospital Stay (HOSPITAL_COMMUNITY)
Admission: EM | Admit: 2024-01-23 | Discharge: 2024-01-30 | DRG: 291 | Disposition: A | Discharge status: Home / Self Care | Payer: Medicaid Other | Source: Skilled Nursing Facility | Attending: Internal Medicine | Admitting: Internal Medicine

## 2024-01-23 ENCOUNTER — Other Ambulatory Visit: Payer: Self-pay

## 2024-01-23 ENCOUNTER — Emergency Department (HOSPITAL_COMMUNITY): Payer: Medicaid Other

## 2024-01-23 DIAGNOSIS — D696 Thrombocytopenia, unspecified: Secondary | ICD-10-CM | POA: Diagnosis present

## 2024-01-23 DIAGNOSIS — N189 Chronic kidney disease, unspecified: Secondary | ICD-10-CM | POA: Diagnosis not present

## 2024-01-23 DIAGNOSIS — N186 End stage renal disease: Secondary | ICD-10-CM | POA: Diagnosis present

## 2024-01-23 DIAGNOSIS — J9621 Acute and chronic respiratory failure with hypoxia: Secondary | ICD-10-CM

## 2024-01-23 DIAGNOSIS — Z881 Allergy status to other antibiotic agents status: Secondary | ICD-10-CM | POA: Diagnosis not present

## 2024-01-23 DIAGNOSIS — I483 Typical atrial flutter: Secondary | ICD-10-CM

## 2024-01-23 DIAGNOSIS — R54 Age-related physical debility: Secondary | ICD-10-CM | POA: Diagnosis present

## 2024-01-23 DIAGNOSIS — I4892 Unspecified atrial flutter: Secondary | ICD-10-CM | POA: Diagnosis present

## 2024-01-23 DIAGNOSIS — I480 Paroxysmal atrial fibrillation: Secondary | ICD-10-CM | POA: Diagnosis present

## 2024-01-23 DIAGNOSIS — I083 Combined rheumatic disorders of mitral, aortic and tricuspid valves: Secondary | ICD-10-CM | POA: Diagnosis present

## 2024-01-23 DIAGNOSIS — R9431 Abnormal electrocardiogram [ECG] [EKG]: Secondary | ICD-10-CM | POA: Insufficient documentation

## 2024-01-23 DIAGNOSIS — F17210 Nicotine dependence, cigarettes, uncomplicated: Secondary | ICD-10-CM | POA: Diagnosis present

## 2024-01-23 DIAGNOSIS — I5023 Acute on chronic systolic (congestive) heart failure: Secondary | ICD-10-CM | POA: Diagnosis present

## 2024-01-23 DIAGNOSIS — J9600 Acute respiratory failure, unspecified whether with hypoxia or hypercapnia: Secondary | ICD-10-CM | POA: Insufficient documentation

## 2024-01-23 DIAGNOSIS — Z992 Dependence on renal dialysis: Secondary | ICD-10-CM

## 2024-01-23 DIAGNOSIS — Z79899 Other long term (current) drug therapy: Secondary | ICD-10-CM

## 2024-01-23 DIAGNOSIS — F1910 Other psychoactive substance abuse, uncomplicated: Secondary | ICD-10-CM | POA: Diagnosis not present

## 2024-01-23 DIAGNOSIS — I959 Hypotension, unspecified: Secondary | ICD-10-CM | POA: Insufficient documentation

## 2024-01-23 DIAGNOSIS — J810 Acute pulmonary edema: Secondary | ICD-10-CM | POA: Diagnosis not present

## 2024-01-23 DIAGNOSIS — R0602 Shortness of breath: Secondary | ICD-10-CM | POA: Diagnosis present

## 2024-01-23 DIAGNOSIS — M898X9 Other specified disorders of bone, unspecified site: Secondary | ICD-10-CM | POA: Diagnosis present

## 2024-01-23 DIAGNOSIS — Z681 Body mass index (BMI) 19 or less, adult: Secondary | ICD-10-CM

## 2024-01-23 DIAGNOSIS — M3214 Glomerular disease in systemic lupus erythematosus: Secondary | ICD-10-CM | POA: Diagnosis present

## 2024-01-23 DIAGNOSIS — S71109S Unspecified open wound, unspecified thigh, sequela: Secondary | ICD-10-CM | POA: Diagnosis not present

## 2024-01-23 DIAGNOSIS — S71109A Unspecified open wound, unspecified thigh, initial encounter: Secondary | ICD-10-CM

## 2024-01-23 DIAGNOSIS — L97119 Non-pressure chronic ulcer of right thigh with unspecified severity: Secondary | ICD-10-CM | POA: Diagnosis present

## 2024-01-23 DIAGNOSIS — N2581 Secondary hyperparathyroidism of renal origin: Secondary | ICD-10-CM | POA: Diagnosis present

## 2024-01-23 DIAGNOSIS — D6859 Other primary thrombophilia: Secondary | ICD-10-CM | POA: Diagnosis present

## 2024-01-23 DIAGNOSIS — R64 Cachexia: Secondary | ICD-10-CM | POA: Diagnosis present

## 2024-01-23 DIAGNOSIS — J9601 Acute respiratory failure with hypoxia: Secondary | ICD-10-CM | POA: Diagnosis not present

## 2024-01-23 DIAGNOSIS — Z7901 Long term (current) use of anticoagulants: Secondary | ICD-10-CM | POA: Diagnosis not present

## 2024-01-23 DIAGNOSIS — I132 Hypertensive heart and chronic kidney disease with heart failure and with stage 5 chronic kidney disease, or end stage renal disease: Principal | ICD-10-CM | POA: Diagnosis present

## 2024-01-23 DIAGNOSIS — Z9981 Dependence on supplemental oxygen: Secondary | ICD-10-CM

## 2024-01-23 DIAGNOSIS — I4891 Unspecified atrial fibrillation: Secondary | ICD-10-CM

## 2024-01-23 DIAGNOSIS — D631 Anemia in chronic kidney disease: Secondary | ICD-10-CM | POA: Diagnosis present

## 2024-01-23 DIAGNOSIS — E43 Unspecified severe protein-calorie malnutrition: Secondary | ICD-10-CM | POA: Diagnosis present

## 2024-01-23 DIAGNOSIS — I9589 Other hypotension: Secondary | ICD-10-CM | POA: Diagnosis present

## 2024-01-23 DIAGNOSIS — M329 Systemic lupus erythematosus, unspecified: Secondary | ICD-10-CM | POA: Diagnosis present

## 2024-01-23 DIAGNOSIS — D6869 Other thrombophilia: Secondary | ICD-10-CM | POA: Insufficient documentation

## 2024-01-23 DIAGNOSIS — S71101A Unspecified open wound, right thigh, initial encounter: Secondary | ICD-10-CM | POA: Diagnosis not present

## 2024-01-23 LAB — CBC WITH DIFFERENTIAL/PLATELET
Abs Immature Granulocytes: 0.05 10*3/uL (ref 0.00–0.07)
Basophils Absolute: 0 10*3/uL (ref 0.0–0.1)
Basophils Relative: 0 %
Eosinophils Absolute: 0.1 10*3/uL (ref 0.0–0.5)
Eosinophils Relative: 1 %
HCT: 33.6 % — ABNORMAL LOW (ref 36.0–46.0)
Hemoglobin: 10 g/dL — ABNORMAL LOW (ref 12.0–15.0)
Immature Granulocytes: 1 %
Lymphocytes Relative: 32 %
Lymphs Abs: 2.9 10*3/uL (ref 0.7–4.0)
MCH: 29.6 pg (ref 26.0–34.0)
MCHC: 29.8 g/dL — ABNORMAL LOW (ref 30.0–36.0)
MCV: 99.4 fL (ref 80.0–100.0)
Monocytes Absolute: 1.1 10*3/uL — ABNORMAL HIGH (ref 0.1–1.0)
Monocytes Relative: 12 %
Neutro Abs: 5 10*3/uL (ref 1.7–7.7)
Neutrophils Relative %: 54 %
Platelets: 268 10*3/uL (ref 150–400)
RBC: 3.38 MIL/uL — ABNORMAL LOW (ref 3.87–5.11)
RDW: 20.6 % — ABNORMAL HIGH (ref 11.5–15.5)
WBC: 9.1 10*3/uL (ref 4.0–10.5)
nRBC: 1 % — ABNORMAL HIGH (ref 0.0–0.2)

## 2024-01-23 LAB — BASIC METABOLIC PANEL
Anion gap: 15 (ref 5–15)
BUN: 39 mg/dL — ABNORMAL HIGH (ref 6–20)
CO2: 28 mmol/L (ref 22–32)
Calcium: 8.2 mg/dL — ABNORMAL LOW (ref 8.9–10.3)
Chloride: 95 mmol/L — ABNORMAL LOW (ref 98–111)
Creatinine, Ser: 4.56 mg/dL — ABNORMAL HIGH (ref 0.44–1.00)
GFR, Estimated: 12 mL/min — ABNORMAL LOW (ref >60)
Glucose, Bld: 100 mg/dL — ABNORMAL HIGH (ref 70–99)
Potassium: 3.5 mmol/L (ref 3.5–5.1)
Sodium: 138 mmol/L (ref 135–145)

## 2024-01-23 LAB — PHOSPHORUS: Phosphorus: 4.5 mg/dL (ref 2.5–4.6)

## 2024-01-23 LAB — HEPATITIS B SURFACE ANTIGEN: Hepatitis B Surface Ag: NONREACTIVE

## 2024-01-23 LAB — MAGNESIUM: Magnesium: 2.6 mg/dL — ABNORMAL HIGH (ref 1.7–2.4)

## 2024-01-23 LAB — ALBUMIN: Albumin: 2.7 g/dL — ABNORMAL LOW (ref 3.5–5.0)

## 2024-01-23 LAB — CBG MONITORING, ED: Glucose-Capillary: 79 mg/dL (ref 70–99)

## 2024-01-23 MED ORDER — LIDOCAINE-PRILOCAINE 2.5-2.5 % EX CREA: 1.0000 | TOPICAL_CREAM | CUTANEOUS | Status: DC | PRN

## 2024-01-23 MED ORDER — ALBUTEROL SULFATE (2.5 MG/3ML) 0.083% IN NEBU
2.5000 mg | INHALATION_SOLUTION | RESPIRATORY_TRACT | Status: DC | PRN
  Administered 2024-01-23 – 2024-01-29 (×8): 2.5 mg via RESPIRATORY_TRACT
  Filled 2024-01-23 (×8): qty 3

## 2024-01-23 MED ORDER — CINACALCET HCL 30 MG PO TABS
30.0000 mg | ORAL_TABLET | Freq: Every day | ORAL | Status: DC
  Administered 2024-01-25 – 2024-01-30 (×6): 30 mg via ORAL
  Filled 2024-01-23 (×8): qty 1

## 2024-01-23 MED ORDER — METOPROLOL SUCCINATE ER 25 MG PO TB24
12.5000 mg | ORAL_TABLET | Freq: Every day | ORAL | Status: DC
  Administered 2024-01-23: 12.5 mg via ORAL
  Filled 2024-01-23: qty 1

## 2024-01-23 MED ORDER — ALTEPLASE 2 MG IJ SOLR: 2.0000 mg | Freq: Once | INTRAMUSCULAR | Status: DC | PRN

## 2024-01-23 MED ORDER — MELATONIN 3 MG PO TABS
3.0000 mg | ORAL_TABLET | Freq: Every day | ORAL | Status: DC
  Administered 2024-01-23 – 2024-01-29 (×6): 3 mg via ORAL
  Filled 2024-01-23 (×7): qty 1

## 2024-01-23 MED ORDER — HEPARIN SODIUM (PORCINE) 1000 UNIT/ML DIALYSIS: 1000.0000 [IU] | INTRAMUSCULAR | Status: DC | PRN

## 2024-01-23 MED ORDER — MIDODRINE HCL 5 MG PO TABS
10.0000 mg | ORAL_TABLET | Freq: Once | ORAL | Status: AC
  Administered 2024-01-25: 10 mg via ORAL
  Filled 2024-01-23: qty 2

## 2024-01-23 MED ORDER — ACETAMINOPHEN 500 MG PO TABS
1000.0000 mg | ORAL_TABLET | Freq: Three times a day (TID) | ORAL | Status: DC | PRN
  Administered 2024-01-23 – 2024-01-27 (×3): 1000 mg via ORAL
  Filled 2024-01-23 (×4): qty 2

## 2024-01-23 MED ORDER — APIXABAN 2.5 MG PO TABS
2.5000 mg | ORAL_TABLET | Freq: Two times a day (BID) | ORAL | Status: DC
  Administered 2024-01-23: 2.5 mg via ORAL
  Filled 2024-01-23: qty 1

## 2024-01-23 MED ORDER — MIDODRINE HCL 5 MG PO TABS
10.0000 mg | ORAL_TABLET | Freq: Three times a day (TID) | ORAL | Status: DC
  Administered 2024-01-23 – 2024-01-30 (×21): 10 mg via ORAL
  Filled 2024-01-23 (×23): qty 2

## 2024-01-23 MED ORDER — RENA-VITE PO TABS
1.0000 | ORAL_TABLET | Freq: Every day | ORAL | Status: DC
  Administered 2024-01-24 – 2024-01-29 (×6): 1 via ORAL
  Filled 2024-01-23 (×6): qty 1

## 2024-01-23 MED ORDER — SEVELAMER CARBONATE 800 MG PO TABS
2400.0000 mg | ORAL_TABLET | Freq: Three times a day (TID) | ORAL | Status: DC
  Administered 2024-01-23 – 2024-01-30 (×16): 2400 mg via ORAL
  Filled 2024-01-23 (×17): qty 3

## 2024-01-23 MED ORDER — PENTAFLUOROPROP-TETRAFLUOROETH EX AERO: 1.0000 | INHALATION_SPRAY | CUTANEOUS | Status: DC | PRN

## 2024-01-23 MED ORDER — COSYNTROPIN 0.25 MG IJ SOLR
0.2500 mg | Freq: Once | INTRAMUSCULAR | Status: AC
  Administered 2024-01-24: 0.25 mg via INTRAVENOUS
  Filled 2024-01-23 (×2): qty 0.25

## 2024-01-23 MED ORDER — LIDOCAINE HCL (PF) 1 % IJ SOLN: 5.0000 mL | INTRAMUSCULAR | Status: DC | PRN

## 2024-01-23 MED ORDER — NEPRO/CARBSTEADY PO LIQD
237.0000 mL | ORAL | Status: DC | PRN
  Filled 2024-01-23: qty 237

## 2024-01-23 MED ORDER — MEDIHONEY WOUND/BURN DRESSING EX PSTE
1.0000 | PASTE | Freq: Every day | CUTANEOUS | Status: DC
  Administered 2024-01-23 – 2024-01-30 (×6): 1 via TOPICAL
  Filled 2024-01-23 (×2): qty 44

## 2024-01-23 MED ORDER — CHLORHEXIDINE GLUCONATE CLOTH 2 % EX PADS
6.0000 | MEDICATED_PAD | Freq: Every day | CUTANEOUS | Status: DC
  Administered 2024-01-23 – 2024-01-28 (×5): 6 via TOPICAL

## 2024-01-23 MED ORDER — ANTICOAGULANT SODIUM CITRATE 4% (200MG/5ML) IV SOLN
5.0000 mL | Status: DC | PRN
  Filled 2024-01-23: qty 5

## 2024-01-23 MED ORDER — AMIODARONE HCL 200 MG PO TABS
200.0000 mg | ORAL_TABLET | Freq: Every day | ORAL | Status: DC
  Administered 2024-01-23 – 2024-01-30 (×8): 200 mg via ORAL
  Filled 2024-01-23 (×8): qty 1

## 2024-01-23 NOTE — Assessment & Plan Note (Signed)
    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

## 2024-01-23 NOTE — Assessment & Plan Note (Deleted)
Acute on chronic respiratory failure Discharge summary from [facility] reports the patient was back at her "baseline" oxygen but not documented amount, patient reports she was using 2 L now requiring 3 L to avoid desaturation and dyspnea.  Chest x-ray independently review and interpreted:pulmonary edema and known cardiomegaly, likely volume overload complicated by the patient's heart failure with reduced ejection fraction, nephrology has been consulted and will provide dialysis today.  Of note the patient's UF has been reduced recently due to complications of hypotension and rapid ventricular rate. Will need to examine post euvolvemia. Do not suspect a secondary bacterial PNA on the recent viral infection the patient had a t WF at this time. Continue supplemental O2 and pulse ox.

## 2024-01-23 NOTE — H&P (Addendum)
History and Physical    Patient: [name] ZOX:[id number] DOB: September 29, 1981 DOA: 01/23/2024 DOS: the patient was seen and examined on 01/23/2024 PCP: [name], NP  Patient coming from: SNF  Chief Complaint:  Chief Complaint  Patient presents with   Shortness of Breath   HPI: 43 year old female with a past medical history of end-stage renal disease on HD, heart failure with reduced ejection fraction chronic, lupus with nephritis by history, recent discharge from [facility] January 2025 following admission for acute respiratory failure as well as volume overload.the patient reports no fever no chills however reports increasing shortness of breath and expresses concern that she has not been receiving her medications or supplemental oxygen regularly at her skilled nursing facility.  She denies any changes in her wounds reports she believes these are improving she did not want to take off the bandage however showed me a picture on her phone of her right thigh wound.  She denies any chest pain or palpitations. Review of the patient's discharge summary from [facility] her stay and dialysis was complicated limiting UF by a flutter with rapid ventricular response and hypotension during that stay she also had influenza A they attempted to introduce beta-blockade however had to abandon this plan given hypotension and  discharged her on amiodarone with a plan on January 30 to go up to 200 mg daily.  Also reviewed the patient's discharge summary from December 2025 from last year where she had sepsis secondary to Klebsiella bacteremia and developed a right thigh wound at that time the differential mentioned as either lupus or drug allergy in the discharge summary.  Nephrology has consulted on the patient in the emergency department and plans for inpatient hemodialysis later today.  Review of Systems:  Constitutional: Denies Weight Loss, Fever, Chills or Night Sweats Respiratory: Reports  Shortness of Breath but no Cough, Hemoptysis, Wheezing, Pleurisy Cardiovascular: Denies Chest Pain but dies have Paroxsymal Nocturnal Dyspnea,  no Palpitations Psychiatric: Denies Depression, Anxiety, Ant-Depressant Usage , Denies Ethanol or Tobacco Abuse Hem/Lymphatic: Denies Easy Bruising, Blood Clots, Reports Skin Wounds Allergy/Immun: Denies history of Hay Fever, Positive PPD or Hives All other systems were reviewed and are negative   Past Medical History:  Diagnosis Date   Anemia    low iron - receives iron at dialysis   Anxiety    Arthritis    RA   Atrial fibrillation with RVR (HCC) 12/14/2023   Chronic systolic congestive heart failure (HCC) 03/16/2016   Dyspnea    ESRD (end stage renal disease) (HCC)    [facility] _ [place]   H/O pericarditis 01/17/2013   H/O pleural effusion 01/17/2013   Heart murmur    Lupus (systemic lupus erythematosus) (HCC)    Previously followed with Dr. 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 pneumonae bacteremia on 01/21/2013. Sensitive to Peniccilin    Past Surgical History:  Procedure Laterality Date   A/V FISTULAGRAM N/A 12/15/2023   Procedure: A/V Fistulagram;  Surgeon: Ethelene Hal, MD;  Location: [facility] INVASIVE CV LAB;  Service: Cardiovascular;  Laterality: N/A;   AV FISTULA PLACEMENT     AV FISTULA PLACEMENT Right 08/01/2020   Procedure: RIGHT ARM BRACHIOCEPHALIC  ARTERIOVENOUS (AV) FISTULA CREATION;  Surgeon: Larina Earthly, MD;  Location: [facility] OR;  Service:  Vascular;  Laterality: Right;   BASCILIC VEIN TRANSPOSITION Left 02/05/2014   Procedure: BASCILIC VEIN TRANSPOSITION;  Surgeon: Larina Earthly, MD;  Location: [facility] OR;  Service: Vascular;  Laterality: Left;   BASCILIC VEIN TRANSPOSITION Right 01/08/2021   Procedure: RIGHT ARM SECOND STAGE BASCILIC VEIN TRANSPOSITION;  Surgeon: Larina Earthly, MD;  Location: [facility] OR;  Service: Vascular;  Laterality: Right;   BIOPSY  12/17/2021   Procedure: BIOPSY;  Surgeon: Lemar Lofty., MD;  Location: [facility] ENDOSCOPY;  Service: Gastroenterology;;   ESOPHAGOGASTRODUODENOSCOPY (EGD) WITH PROPOFOL N/A 12/17/2021   Procedure: ESOPHAGOGASTRODUODENOSCOPY (EGD) WITH PROPOFOL;  Surgeon: Lemar Lofty., MD;  Location: [facility] ENDOSCOPY;  Service: Gastroenterology;  Laterality: N/A;   FISTULA SUPERFICIALIZATION Left 05/30/2018   Procedure: FISTULA PLICATION BASILIC VEIN TRANSPOSITION;  Surgeon: Chuck Hint, MD;  Location: [facility] OR;  Service: Vascular;  Laterality: Left;   FISTULA SUPERFICIALIZATION Left 12/18/2019   Procedure: PLICATION OF LEFT ARTERIOVENOUS FISTULA ULCER;  Surgeon: Larina Earthly, MD;  Location: [facility] OR;  Service: Vascular;  Laterality: Left;   I & D EXTREMITY Right 02/18/2021   Procedure: IRRIGATION AND DEBRIDEMENT OF ARM;  Surgeon: Chuck Hint, MD;  Location: [facility] OR;  Service: Vascular;  Laterality: Right;   INSERTION OF DIALYSIS CATHETER N/A 05/19/2020   Procedure: TUNNELED INSERTION  OF DIALYSIS CATHETER;  Surgeon: Maeola Harman, MD;  Location: [facility] OR;  Service: Vascular;  Laterality: N/A;   PERIPHERAL VASCULAR BALLOON ANGIOPLASTY  12/15/2023   Procedure: PERIPHERAL VASCULAR BALLOON ANGIOPLASTY;  Surgeon: Ethelene Hal, MD;  Location: [facility] INVASIVE CV LAB;  Service: Cardiovascular;;   THROMBECTOMY AND REVISION OF ARTERIOVENTOUS (AV) GORETEX  GRAFT Left 07/28/2020   Procedure: Oversewing of left arm Brachial cephalic fistula for bleeding.;  Surgeon: Chuck Hint, MD;   Location: [facility] OR;  Service: Vascular;  Laterality: Left;   VENOGRAM Right 01/31/2014   Procedure: DIALYSIS CATHETER;  Surgeon: Nada Libman, MD;  Location: [facility] CATH LAB;  Service: Cardiovascular;  Laterality: Right;   Social History:  reports that she has been smoking cigarettes. She has a 1.8 pack-year smoking history. She has been exposed to tobacco smoke. She has never used smokeless tobacco. She reports current alcohol use. She reports current drug use. Drug: Marijuana.  Allergies  Allergen Reactions   Cephalosporins Rash    To both keflex and cefazolin.  Potential bullous lesion from Ceftriaxone 11/2023.   Tobramycin Sulfate Swelling    Eye swelling   Vancomycin Swelling    No family history on file.  Prior to Admission medications   Medication Sig Start Date End Date Taking? Authorizing Provider  acetaminophen (TYLENOL) 500 MG tablet Take 2 tablets (1,000 mg total) by mouth every 8 (eight) hours as needed for mild pain (pain score 1-3). 12/22/23   Monna Fam, MD  albuterol (PROVENTIL) (2.5 MG/3ML) 0.083% nebulizer solution Take 3 mLs (2.5 mg total) by nebulization every 2 (two) hours as needed for wheezing or shortness of breath. 12/06/22   Gwenevere Abbot, MD  apixaban (ELIQUIS) 2.5 MG TABS tablet Take 1 tablet (2.5 mg total) by mouth 2 (two) times daily. 12/22/23   Monna Fam, MD  cinacalcet [facility]) 30  MG tablet Take 30 mg by mouth every evening. 11/24/22   [provider]  diphenhydrAMINE (BENADRYL) 25 MG tablet Take 1 tablet (25 mg total) by mouth every 4 (four) hours as needed for itching or allergies. Patient taking differently: Take 25 mg by mouth 2 (two) times daily. 01/23/21   Rolly Salter, MD  doxercalciferol (HECTOROL) 4 MCG/2ML injection Use as recommended by nephrology Patient taking differently: 4 mcg Every Tuesday,Thursday,and Saturday with dialysis. Use as recommended by nephrology 11/05/20   Kathlen Mody, MD  loperamide (IMODIUM) 2 MG capsule Take 1  capsule (2 mg total) by mouth as needed for diarrhea or loose stools. 12/22/23   Monna Fam, MD  melatonin 3 MG TABS tablet Take 1 tablet (3 mg total) by mouth at bedtime. 12/22/23   Monna Fam, MD  midodrine (PROAMATINE) 5 MG tablet Take 1 tablet (5 mg total) by mouth 3 (three) times daily with meals. 12/22/23   Monna Fam, MD  multivitamin (RENA-VIT) TABS tablet Take 1 tablet by mouth daily.    [provider]  Nystatin (GERHARDT'S BUTT CREAM) CREA Apply 1 Application topically as needed for irritation. 12/22/23   Monna Fam, MD  nystatin (MYCOSTATIN) 100000 UNIT/ML suspension Take 5 mLs (500,000 Units total) by mouth 4 (four) times daily. 12/22/23   Monna Fam, MD  sevelamer carbonate (RENVELA) 800 MG tablet Take 2,400 mg by mouth 3 (three) times daily. 02/10/22   [provider]    Physical Exam: Vitals:   01/23/24 0945 01/23/24 1118 01/23/24 1130 01/23/24 1145  BP: 112/61  99/87   Pulse: (!) 106  (!) 103   Resp: (!) 24  (!) 24 (!) 24  Temp:  97.7 F (36.5 C)    TempSrc:  Oral    SpO2:   100% 100%  Weight:      Height:      Patient seen Trauma Room C at 12:47 PM Constitutional:  Vital Signs as per [facility] than three noted] No Acute Distress Eyes:  Pink Conjunctiva and no Ptosis Neck:     Trachea Midline, Neck Symmetric Respiratory:   Respiratory Effort Normal: No Use of Respiratory Muscles,No  Intercostal Retractions             Lungs Crackles to Auscultation Bilaterally Cardiovascular:   Heart Auscultated: Irregular Irregular with systolic murmour              Trace Lower Extremity Edema Gastrointestinal:  Abdomen soft and nontender without palpable masses, guarding or rebound  Neurologic:  Moves all 4 limbs spontaneously to gravity Psychiatric:  Patient Orientated to Time, Place and Person Patient with appropriate mood and affect Recent and Remote Memory Intact RUE AV Fistula Patient has a right thigh larger wound covered in a dressing  reviewed picture which shows a red granulation tissue no signs of secondary bacterial infection she reports a small wound on her left pinky which is also covered by bandage, no tracking erythema seen. 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  Data Reviewed: Labs, Radiology, ECG as detailed in HPI and A/P   Assessment and Plan: * Acute respiratory failure (HCC) Acute on chronic respiratory failure Discharge summary from [facility] reports the patient was back at her "baseline" oxygen but not documented amount, patient reports she was using 2 L now requiring 3 L to avoid desaturation and dyspnea.  Chest x-ray independently review and interpreted:pulmonary edema and known cardiomegaly, likely volume overload complicated by the patient's heart failure with reduced ejection  fraction, nephrology has been consulted and will provide dialysis today.  Of note the patient's UF has been reduced recently due to complications of hypotension and rapid ventricular rate. Will need to examine post euvolvemia. Do not suspect a secondary bacterial PNA on the recent viral infection the patient had a t WF at this time. Continue supplemental O2 and pulse ox.  Substance abuse (HCC)  Substance abuse Patient denies any cocaine use since last year  Atrial flutter (HCC)  Atrial flutter Probable to be permanent at this point  first objectively documented December of last year Twelve-lead ECG independently reviewed and interpreted patient has a ventricular rate in the 100s which is improved from ECG December of last year QTc is prolonged at 565 low no new ST changes Has history of beta-blocker intolerance recently at [facility] we will continue amiodarone 200 mg daily, cardiac consultation (as recommended Nephro consult note) and support the patient's blood pressure with midodrine  Acute on chronic systolic CHF (congestive heart failure) (HCC) Acute on chronic systolic heart failure Last echocardiogram from  December 2024 revealed EF 30% as well as 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 In for euvolemia with hemodialysis  Hypotension   Hypotension ACTH stimulation test has been ordered by nephrology for the a.m., have ordered another blood culture to rule out subacute endocarditis given past bacteremia, we will support with midodrine 30 mg with meals Maintaining a systolic greater than 90 off of any pressors and mentating well, do not suspect sepsis at this time  ESRD (end stage renal disease) (HCC)   End stage renal disease BUN is 39 no signs of uremia at this time we will continue Sensipar 30 mg and Renvela 2400 mg and nephrology consultation Plan for hemodialysis tonight  Abnormal ECG  QTc prolongation 565, magnesium is at target We will follow potassium with dialysis, end-stage renal disease potassium is low at 3.5 possible require supplementation but hesitate to supplement given the patient's difficulties with dialysis due to pressures, will defer to nephrology Telemetry   Open thigh wound    Right thigh wound Present on admission along with patient reported buttock and also left pinky wound, differential would include calciphylaxis, reviewed picture which showed good granulation tissue including slough does not appear infected, as per the discharge summary in December of last year differential was also lupus or drug allergy we will consult wound care and monitor for any signs or symptoms of secondary infection  Secondary hypercoagulability disorder (HCC)      Secondary hypercoagulable state Atrial flutter The patient does have mitral valvular disease but also end-stage renal disease will continue the home Eliquis for now for interval review  Thrombocytopenia (HCC)   Thrombocytopenia This has recovered platelets are 268 today   Anemia due to chronic kidney disease  Anemia Hemoglobin 10 This is likely secondary to the patient's end-stage renal  disease  Lupus (systemic lupus erythematosus) (HCC) Lupus History of lupus nephritis does not seem to have any acute episode at this time    Advance Care Planning:   Code Status: Full Code   Consults: Nephrology (consulted in ED) , Cardiology (Staff Message sent to Salley Hews)  Family Communication: Patient Onlt  Severity of Illness: The appropriate patient status for this patient is INPATIENT. Inpatient status is judged to be reasonable and necessary in order to provide the required intensity of service to ensure the patient's safety. The patient's presenting symptoms, physical exam findings, and initial radiographic and laboratory data in the context  of their chronic comorbidities is felt to place them at high risk for further clinical deterioration. Furthermore, it is not anticipated that the patient will be medically stable for discharge from the hospital within 2 midnights of admission.   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

## 2024-01-23 NOTE — ED Notes (Signed)
Pt refusing Bipap at this time. Pt  concerned with the amount of secretions she is coughing up and feels like she will not be able to keep the mask on.

## 2024-01-23 NOTE — Assessment & Plan Note (Addendum)
  Atrial flutter Probable to be permanent at this point  first objectively documented December of last year Twelve-lead ECG independently reviewed and interpreted patient has a ventricular rate in the 100s which is improved from ECG December of last year QTc is prolonged at 565 low no new ST changes Has history of beta-blocker intolerance recently at [facility] we will continue amiodarone 200 mg daily, cardiac consultation (as recommended Nephro consult note) and support the patient's blood pressure with midodrine

## 2024-01-23 NOTE — Progress Notes (Signed)
Pt is on 3L [place], vitals are stable.    01/23/24 0559  BiPAP/CPAP/SIPAP  $ Non-Invasive Ventilator  Non-Invasive Vent Initial  $ Face Mask Small Yes  BiPAP/CPAP/SIPAP Pt Type Adult  BiPAP/CPAP/SIPAP SERVO  Reason BIPAP/CPAP not in use Other(comment) (Pt not in respiratory distress at this time.)  Mask Type Full face mask  Mask Size Small  BiPAP/CPAP /SiPAP Vitals  Pulse Rate 70  Resp 16  Bilateral Breath Sounds Coarse crackles  MEWS Score/Color  MEWS Score 0  MEWS Score Color [place] Si

## 2024-01-23 NOTE — Assessment & Plan Note (Signed)
   Thrombocytopenia This has recovered platelets are 268 today

## 2024-01-23 NOTE — Assessment & Plan Note (Signed)
  Substance abuse Patient denies any cocaine use since last year

## 2024-01-23 NOTE — Assessment & Plan Note (Signed)
  Anemia Hemoglobin 10 This is likely secondary to the patient's end-stage renal disease

## 2024-01-23 NOTE — Assessment & Plan Note (Signed)
  QTc prolongation 565, magnesium is at target We will follow potassium with dialysis, end-stage renal disease potassium is low at 3.5 possible require supplementation but hesitate to supplement given the patient's difficulties with dialysis due to pressures Telemetry

## 2024-01-23 NOTE — Consult Note (Signed)
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 Change daily   Cleanse thigh wound with Vashe [place] # [phone]), pat dry. Apply 1/4" thick layer of leptospermum honey to wound bed, top with saline moist gauze,top with dry dressing, change daily.    Re consult if needed, will not follow at this time. Thanks  Jenai Scaletta [employer], RN,CWOCN, CNS, CWON-AP [phone])

## 2024-01-23 NOTE — Assessment & Plan Note (Signed)
Lupus History of lupus nephritis does not seem to have any acute episode at this time

## 2024-01-23 NOTE — Assessment & Plan Note (Signed)
      Secondary hypercoagulable state Atrial flutter The patient does have mitral valvular disease but also end-stage renal disease will continue the home Eliquis for now for interval review

## 2024-01-23 NOTE — Consult Note (Signed)
Cardiology Consultation   Patient ID: [name] MRN: [id number]; DOB: 03/04/1981  Admit date: 01/23/2024 Date of Consult: 01/23/2024  PCP:  [name], NP   [facility] Providers Cardiologist:  Jodelle Red, MD   {  Patient Profile:   [name] is a 43 y.o. female with a hx of lupus nephritis, ESRD on HD (Tue/Thur/Sat), HFrEF, polysubstance use, known atrial fibrillation/flutter, valvular heart disease with mitral stenosis/mitral regurgitation, aortic insufficiency for evaluation of atrial flutter, acute on chronic respiratory failure with hypoxia who is being seen 01/23/2024 for the evaluation of atrial flutter at the request of Dr. Dorna Mai.  History of Present Illness:   [name] has a past medical history of lupus nephritis, ESRD on HD (Tue/Thur/Sat), combined systolic and diastolic heart failure, polysubstance use, known atrial fibrillation/flutter,valvular heart disease with mitral stenosis/mitral regurgitation, aortic insufficiency for evaluation of atrial flutter, acute on chronic respiratory failure with hypoxia. She presented to the [facility] ED via EMS from her nursing home this morning for increasing shortness of breath.   Patient was just recent discharged from [facility] being admitted 01/08/24-01/21/24 for acute respiratory failure as well as volume overload. Patient reported that since being discharged she was not receiving her new medications from her nursing home. Patient typically does dialysis Tues/Thurs/Sat and she did go to dialysis yesterday 1/25 but she said they were unable to get off any fluid from her because she had an elevated HR. She states she does not wear any oxygen at home.   Since arrival to ED patient has been on supplemental oxygen, currently on 3 L via [place]. She states her breathing feels much improved from when she arrived. She has noted palpitations in the past but she does not report them or chest pain today. Her  main complaint was her shortness of breath.  Most recent echocardiogram from 11/2023 showed LVEF 30 to 35%, mild left ventricular enlargement, severe left atrial enlargement, moderate right atrial enlargement, mild right ventricular enlargement with moderate RV dysfunction, mild mitral stenosis, moderate mitral regurgitation, moderate to severe aortic insufficiency and moderate to severe tricuspid regurgitation. CXR showed cardiac enlargement and signs of CHF. EKG showed atrial flutter with previously noted T wave inversions.   Past Medical History:  Diagnosis Date   Anemia    low iron - receives iron at dialysis   Anxiety    Arthritis    RA   Atrial fibrillation with RVR (HCC) 12/14/2023   Chronic systolic congestive heart failure (HCC) 03/16/2016   Dyspnea    ESRD (end stage renal disease) (HCC)    [facility] _ [place]   H/O pericarditis 01/17/2013   H/O pleural effusion 01/17/2013   Heart murmur    Lupus (systemic lupus erythematosus) (HCC)    Previously followed with Dr. Kellie Simmering, has not followed up recently   Lupus nephritis [facility]) 2006   Renal biopsy shows segmental endocapillary proliferation and cellular crescent formation (Class IIIA) and lupus membranous glomerulopathy (Class V, stage II)   Pneumonia    many times   Polysubstance abuse (HCC)    cocaine, MJ, tobacco   S/P pericardiocentesis 01/17/2013   H/o pericardial effusion with tamponade 2006    Seizures (HCC)    during pregnancy 1 time   Septic shock (HCC) 12/13/2023   Streptococcal bacteremia 01/23/2013   She had two S. pneumonae bacteremia on 01/21/2013. Sensitive to Peniccilin    Past Surgical History:  Procedure Laterality Date   A/V FISTULAGRAM  N/A 12/15/2023   Procedure: A/V Fistulagram;  Surgeon: Ethelene Hal, MD;  Location: [facility] INVASIVE CV LAB;  Service: Cardiovascular;  Laterality: N/A;   AV FISTULA PLACEMENT     AV FISTULA PLACEMENT Right 08/01/2020   Procedure: RIGHT ARM BRACHIOCEPHALIC   ARTERIOVENOUS (AV) FISTULA CREATION;  Surgeon: Larina Earthly, MD;  Location: [facility] OR;  Service: Vascular;  Laterality: Right;   BASCILIC VEIN TRANSPOSITION Left 02/05/2014   Procedure: BASCILIC VEIN TRANSPOSITION;  Surgeon: Larina Earthly, MD;  Location: [facility] OR;  Service: Vascular;  Laterality: Left;   BASCILIC VEIN TRANSPOSITION Right 01/08/2021   Procedure: RIGHT ARM SECOND STAGE BASCILIC VEIN TRANSPOSITION;  Surgeon: Larina Earthly, MD;  Location: [facility] OR;  Service: Vascular;  Laterality: Right;   BIOPSY  12/17/2021   Procedure: BIOPSY;  Surgeon: Lemar Lofty., MD;  Location: [facility] ENDOSCOPY;  Service: Gastroenterology;;   ESOPHAGOGASTRODUODENOSCOPY (EGD) WITH PROPOFOL N/A 12/17/2021   Procedure: ESOPHAGOGASTRODUODENOSCOPY (EGD) WITH PROPOFOL;  Surgeon: Lemar Lofty., MD;  Location: [facility] ENDOSCOPY;  Service: Gastroenterology;  Laterality: N/A;   FISTULA SUPERFICIALIZATION Left 05/30/2018   Procedure: FISTULA PLICATION BASILIC VEIN TRANSPOSITION;  Surgeon: Chuck Hint, MD;  Location: [facility] OR;  Service: Vascular;  Laterality: Left;   FISTULA SUPERFICIALIZATION Left 12/18/2019   Procedure: PLICATION OF LEFT ARTERIOVENOUS FISTULA ULCER;  Surgeon: Larina Earthly, MD;  Location: [facility] OR;  Service: Vascular;  Laterality: Left;   I & D EXTREMITY Right 02/18/2021   Procedure: IRRIGATION AND DEBRIDEMENT OF ARM;  Surgeon: Chuck Hint, MD;  Location: [facility] OR;  Service: Vascular;  Laterality: Right;   INSERTION OF DIALYSIS CATHETER N/A 05/19/2020   Procedure: TUNNELED INSERTION  OF DIALYSIS CATHETER;  Surgeon: Maeola Harman, MD;  Location: [facility] OR;  Service: Vascular;  Laterality: N/A;   PERIPHERAL VASCULAR BALLOON ANGIOPLASTY  12/15/2023   Procedure: PERIPHERAL VASCULAR BALLOON ANGIOPLASTY;  Surgeon: Ethelene Hal, MD;  Location: [facility] INVASIVE CV LAB;  Service: Cardiovascular;;   THROMBECTOMY AND REVISION OF ARTERIOVENTOUS (AV) GORETEX  GRAFT Left 07/28/2020   Procedure: Oversewing of  left arm Brachial cephalic fistula for bleeding.;  Surgeon: Chuck Hint, MD;  Location: [facility] OR;  Service: Vascular;  Laterality: Left;   VENOGRAM Right 01/31/2014   Procedure: DIALYSIS CATHETER;  Surgeon: Nada Libman, MD;  Location: [facility] CATH LAB;  Service: Cardiovascular;  Laterality: Right;    Home Medications:  Prior to Admission medications   Medication Sig Start Date End Date Taking? Authorizing Provider  acetaminophen (TYLENOL) 500 MG tablet Take 2 tablets (1,000 mg total) by mouth every 8 (eight) hours as needed for mild pain (pain score 1-3). 12/22/23   Monna Fam, MD  albuterol (PROVENTIL) (2.5 MG/3ML) 0.083% nebulizer solution Take 3 mLs (2.5 mg total) by nebulization every 2 (two) hours as needed for wheezing or shortness of breath. 12/06/22   Gwenevere Abbot, MD  apixaban (ELIQUIS) 2.5 MG TABS tablet Take 1 tablet (2.5 mg total) by mouth 2 (two) times daily. 12/22/23   Monna Fam, MD  cinacalcet (SENSIPAR) 30 MG tablet Take 30 mg by mouth every evening. 11/24/22   [provider]  diphenhydrAMINE (BENADRYL) 25 MG tablet Take 1 tablet (25 mg total) by mouth every 4 (four) hours as needed for itching or allergies. Patient taking differently: Take 25 mg by mouth 2 (two) times daily. 01/23/21   Rolly Salter, MD  doxercalciferol (HECTOROL) 4 MCG/2ML injection Use as recommended by nephrology Patient taking differently: 4 mcg Every Tuesday,Thursday,and Saturday  with dialysis. Use as recommended by nephrology 11/05/20   Kathlen Mody, MD  loperamide (IMODIUM) 2 MG capsule Take 1 capsule (2 mg total) by mouth as needed for diarrhea or loose stools. 12/22/23   Monna Fam, MD  melatonin 3 MG TABS tablet Take 1 tablet (3 mg total) by mouth at bedtime. 12/22/23   Monna Fam, MD  midodrine (PROAMATINE) 5 MG tablet Take 1 tablet (5 mg total) by mouth 3 (three) times daily with meals. 12/22/23   Monna Fam, MD  multivitamin (RENA-VIT) TABS tablet Take 1 tablet by mouth  daily.    [provider]  Nystatin (GERHARDT'S BUTT CREAM) CREA Apply 1 Application topically as needed for irritation. 12/22/23   Monna Fam, MD  nystatin (MYCOSTATIN) 100000 UNIT/ML suspension Take 5 mLs (500,000 Units total) by mouth 4 (four) times daily. 12/22/23   Monna Fam, MD  sevelamer carbonate (RENVELA) 800 MG tablet Take 2,400 mg by mouth 3 (three) times daily. 02/10/22   [provider]   Inpatient Medications: Scheduled Meds:  amiodarone  200 mg Oral Daily   apixaban  2.5 mg Oral BID   Chlorhexidine Gluconate Cloth  6 each Topical Q0600   [START ON 01/24/2024] cinacalcet  30 mg Oral Q breakfast   [START ON 01/24/2024] cosyntropin  0.25 mg Intravenous Once   melatonin  3 mg Oral QHS   metoprolol succinate  12.5 mg Oral Daily   midodrine  10 mg Oral TID WC   midodrine  10 mg Oral Once in dialysis   multivitamin  1 tablet Oral QHS   sevelamer carbonate  2,400 mg Oral TID WC   Continuous Infusions:  PRN Meds: acetaminophen, albuterol  Allergies:    Allergies  Allergen Reactions   Cephalosporins Rash    To both keflex and cefazolin.  Potential bullous lesion from Ceftriaxone 11/2023.   Tobramycin Sulfate Swelling    Eye swelling   Vancomycin Swelling    Social History:   Social History   Socioeconomic History   Marital status: Single    Spouse name: Not on file   Number of children: Not on file   Years of education: Not on file   Highest education level: Not on file  Occupational History   Not on file  Tobacco Use   Smoking status: Every Day    Current packs/day: 0.12    Average packs/day: 0.1 packs/day for 15.0 years (1.8 ttl pk-yrs)    Types: Cigarettes    Passive exposure: Current   Smokeless tobacco: Never   Tobacco comments:    4 cigarettes per day  Vaping Use   Vaping status: Never Used  Substance and Sexual Activity   Alcohol use: Yes    Alcohol/week: 0.0 standard drinks of alcohol    Comment: Special Occasional takes  Vicar   Drug use: Yes    Types: Marijuana    Comment: ocassional, last time- 12/21/20   Sexual activity: Not Currently    Birth control/protection: None  Other Topics Concern   Not on file  Social History Narrative   Not on file   Social Drivers of Health   Financial Resource Strain: Not on file  Food Insecurity: Low Risk  (01/08/2024)   Received from [facility]   Hunger Vital Sign    Worried About Running Out of Food in the Last Year: Never true    Ran Out of Food in the Last Year: Never true  Transportation Needs: No Transportation Needs (01/08/2024)   Received from  [facility]   Transportation    In the past 12 months, has lack of reliable transportation kept you from medical appointments, meetings, work or from getting things needed for daily living? : No  Physical Activity: Not on file  Stress: Not on file  Social Connections: Not on file  Intimate Partner Violence: Patient Declined (12/13/2023)   Humiliation, Afraid, Rape, and Kick questionnaire    Fear of Current or Ex-Partner: Patient declined    Emotionally Abused: Patient declined    Physically Abused: Patient declined    Sexually Abused: Patient declined    Family History:   No family history on file.   ROS:  Please see the history of present illness.  All other ROS reviewed and negative.     Physical Exam/Data:   Vitals:   01/23/24 1118 01/23/24 1130 01/23/24 1145 01/23/24 1454  BP:  99/87    Pulse:  (!) 103  (!) 102  Resp:  (!) 24 (!) 24 (!) 22  Temp: 97.7 F (36.5 C)   98.3 F (36.8 C)  TempSrc: Oral   Temporal  SpO2:  100% 100% 100%  Weight:      Height:        Intake/Output Summary (Last 24 hours) at 01/23/2024 1515 Last data filed at 01/23/2024 8657 Gross per 24 hour  Intake 240 ml  Output --  Net 240 ml      01/23/2024    5:38 AM 12/18/2023    8:00 PM 12/16/2023    2:36 PM  Last 3 Weights  Weight (lbs) 121 lb 4.1 oz 125 lb 10.6 oz 121 lb 4.1 oz   Weight (kg) 55 kg 57 kg 55 kg       Significant value     Body mass index is 21.48 kg/m.  General:  thin female, no acute distress on 3 L oxygen via [place] HEENT: normal Neck: no JVD Cardiac:  tachycardic, no murmur  Lungs:  diffuse crackles, no increased work of breathing  Abd: soft, nontender Ext: no edema, AV fistula in RUE, dressing over chronic wound of right proximal thigh Skin: warm and dry  Neuro:   no focal abnormalities noted  EKG:  The EKG was personally reviewed and demonstrates:  atrial flutter with HR 102 and previously  noted T wave inversions in lateral leads  Telemetry:  Telemetry was personally reviewed and demonstrates:  atrial flutter, HR 102-120s  Relevant CV Studies: Echocardiogram 12/14/2023 IMPRESSIONS   1. Left ventricular ejection fraction, by estimation, is 30 to 35%. The  left ventricle has moderately decreased function. The left ventricle  demonstrates global hypokinesis. The left ventricular internal cavity size  was mildly dilated. Left ventricular  diastolic parameters are indeterminate.   2. Right ventricular systolic function is moderately reduced. The right  ventricular size is mildly enlarged. There is normal pulmonary artery  systolic pressure. The estimated right ventricular systolic pressure is  35.0 mmHg.   3. Left atrial size was severely dilated.   4. Right atrial size was moderately dilated.   5. Thickened and calcified mitral chords, severely calcified leaflets.  The mitral valve is rheumatic. Moderate mitral valve regurgitation. Mild  mitral stenosis. The mean mitral valve gradient is 4.0 mmHg. Moderate  mitral annular calcification.   6. Tricuspid valve regurgitation is moderate to severe. Visually moderate  TR but hepatic vein systolic flow reversal suggests possible severe TR   7. The aortic valve is tricuspid. There is moderate calcification of the  aortic valve. Aortic valve  regurgitation is moderate to severe, highly  eccentric. Aortic valve  sclerosis/calcification is present, without any  evidence of aortic stenosis.   8. The inferior vena cava is dilated in size with >50% respiratory  variability, suggesting right atrial pressure of 8 mmHg.   FINDINGS   Left Ventricle: Left ventricular ejection fraction, by estimation, is 30  to 35%. The left ventricle has moderately decreased function. The left  ventricle demonstrates global hypokinesis. The left ventricular internal  cavity size was mildly dilated.  There is no left ventricular hypertrophy. Left ventricular diastolic  parameters are indeterminate.   Right Ventricle: The right ventricular size is mildly enlarged. No  increase in right ventricular wall thickness. Right ventricular systolic  function is moderately reduced. There is normal pulmonary artery systolic  pressure. The tricuspid regurgitant  velocity is 2.60 m/s, and with an assumed right atrial pressure of 8 mmHg,  the estimated right ventricular systolic pressure is 35.0 mmHg.   Left [facility]: Left atrial size was severely dilated.   Right [facility]: Right atrial size was moderately dilated.   Pericardium: There is no evidence of pericardial effusion.   Mitral Valve: Thickened and calcified mitral chords, severely calcified  leaflets. The mitral valve is rheumatic. There is severe calcification of  the mitral valve leaflet(s). Moderate mitral annular calcification.  Moderate mitral valve regurgitation.  Mild mitral valve stenosis. The mean mitral valve gradient is 4.0 mmHg.   Tricuspid Valve: The tricuspid valve is normal in structure. Tricuspid  valve regurgitation is moderate to severe. The flow in the hepatic veins  is reversed during ventricular systole.   Aortic Valve: The aortic valve is tricuspid. There is moderate  calcification of the aortic valve. Aortic valve regurgitation is moderate  to severe. Aortic valve sclerosis/calcification is present, without any  evidence of aortic stenosis.    Pulmonic Valve: The pulmonic valve was normal in structure. Pulmonic valve  regurgitation is trivial.   Aorta: The aortic root is normal in size and structure.   Venous: The inferior vena cava is dilated in size with greater than 50%  respiratory variability, suggesting right atrial pressure of 8 mmHg.   IAS/Shunts: No atrial level shunt detected by color flow Doppler.   Laboratory Data:  High Sensitivity Troponin:  No results for input(s): "TROPONINIHS" in the last 720 hours.   Chemistry Recent Labs  Lab 01/23/24 0530  NA 138  K 3.5  CL 95*  CO2 28  GLUCOSE 100*  BUN 39*  CREATININE 4.56*  CALCIUM 8.2*  MG 2.6*  GFRNONAA 12*  ANIONGAP 15    Recent Labs  Lab 01/23/24 0530  ALBUMIN 2.7*   Lipids No results for input(s): "CHOL", "TRIG", "HDL", "LABVLDL", "LDLCALC", "CHOLHDL" in the last 168 hours.  Hematology Recent Labs  Lab 01/23/24 0530  WBC 9.1  RBC 3.38*  HGB 10.0*  HCT 33.6*  MCV 99.4  MCH 29.6  MCHC 29.8*  RDW 20.6*  PLT 268   Thyroid No results for input(s): "TSH", "FREET4" in the last 168 hours.  BNPNo results for input(s): "BNP", "PROBNP" in the last 168 hours.  DDimer No results for input(s): "DDIMER" in the last 168 hours.  Radiology/Studies:  DG Chest Port 1 View Result Date: 01/23/2024 CLINICAL DATA:  43 year old female with shortness of breath and hypoxia. EXAM: PORTABLE CHEST 1 VIEW COMPARISON:  Portable chest 01/15/2024, 12/22/2023. FINDINGS: Portable AP upright view at stable moderate to severe cardiomegaly, mediastinal contours. Continued lung base hypo ventilation worse on the left side. Since  last month the pulmonary vascularity has mildly increased. No pneumothorax or consolidation. Trace fluid or thickening in the right minor fissure is unchanged. Overall ventilation has not significantly changed from 01/15/2024. Visualized tracheal air column is within normal limits. No acute osseous abnormality identified. Paucity of bowel gas.  IMPRESSION: Cardiomegaly, small pleural effusions, and pulmonary edema not significantly changed since 01/15/2024. Electronically Signed   By: [name] M.D.   On: 01/23/2024 06:35   Assessment and Plan:   Atrial flutter  History of atrial fibrillation Patient has known history of atrial fibrillation/flutter Reported Hrs in 150s per EMS Most recent HR 102 Continue amiodarone 200 mg daily Continue Toprol 12.5 mg daily  With history of subdural hematoma and recent fall, causing fracture of 4 ribs, she is not a good candidate for long-term AC  Acute on chronic combined systolic/diastolic heart failure Most recent echo from 11/2023 shows: LVEF 30 to 35%, mild left ventricular enlargement, severe left atrial enlargement, moderate right atrial enlargement, mild right ventricular enlargement with moderate RV dysfunction, mild mitral stenosis, moderate mitral regurgitation, moderate to severe aortic insufficiency and moderate to severe tricuspid regurgitation GDMT limited by ESRD and BP Continue Toprol 12.5 mg daily  Volume removal per nephrology  Valvular heart disease  Noted mitral stenosis/mitral regurgitation, aortic insufficiency and tricuspid regurgitation on most recent echocardiogram  Patient will require serial echocardiograms in future to monitor progression Not likely to become a candidate for valve replacement   Per primary ESRD on HD History of substance abuse Anemia SLE with lupus nephritis  Hypercoagulability disorder   Risk Assessment/Risk Scores:       New York Heart Association (NYHA) Functional Class NYHA Class II  CHA2DS2-VASc Score = 3   This indicates a 3.2% annual risk of stroke. The patient's score is based upon: CHF History: 1 HTN History: 1 Diabetes History: 0 Stroke History: 0 Vascular Disease History: 0 Age Score: 0 Gender Score: 1       For questions or updates, please contact [facility] Please consult [email or web address] for contact info  under    Signed, Olena Leatherwood, PA-C  01/23/2024 3:15 PM   As above, patient seen and examined.  Briefly she is a 43 year-old female with past medical history of end-stage renal disease dialysis dependent, lupus nephritis, chronic combined systolic/diastolic congestive heart failure, polysubstance abuse, atrial fibrillation/flutter, valvular heart disease with mitral stenosis/mitral regurgitation, aortic insufficiency, subdural hematoma for evaluation of atrial flutter.  Most recent echocardiogram 12/24 showed ejection fraction 30 to 35%, mild left ventricular enlargement, severe left atrial enlargement, moderate right atrial enlargement, mild right ventricular enlargement with moderate RV dysfunction, mild mitral stenosis, moderate mitral regurgitation, moderate to severe aortic insufficiency and moderate to severe tricuspid regurgitation.  Patient hospitalized December 2024 with heart failure reduced ejection fraction and atrial flutter.  She was felt not to be candidate for surgical repair of her valvular issues and atrial flutter was treated medically.  Patient recently hospitalized at [facility] for palpitations and atrial flutter with rapid ventricular response.  She was placed on amiodarone and Toprol and apixaban was continued.  Patient now presents with complaints of dyspnea.  Noted to be in atrial flutter and cardiology asked to evaluate.  Creatinine 4.56, hemoglobin 10, chest x-ray showed cardiac enlargement and CHF.  Electrocardiogram shows atrial flutter with lateral T wave inversion unchanged compared to previous.  1 atrial flutter-patient's heart rate is high normal to mildly elevated.  Her blood pressure will not tolerate advancing AV nodal blocking  agents.  Would continue amiodarone and low-dose metoprolol and follow.  Patient does have a history of subdural hematoma.  She also recently fell.  I do not think she is a good long-term anticoagulation candidate and would avoid apixaban.  She  is at high risk for embolic event given valvular heart disease but I think at this point no other choice.  2 acute on chronic combined systolic/diastolic congestive heart failure-most recent echocardiogram showed ejection fraction 30 to 35%.  Her blood pressure will not allow guideline directed medical therapy.  Will continue low-dose Toprol.  Volume removal per dialysis.  3 end-stage renal disease-dialysis managed by nephrology.  4 valvular heart disease-patient with mitral stenosis/mitral regurgitation, aortic insufficiency and tricuspid regurgitation on most recent echocardiogram.  She will need follow-up echoes in the future though doubt she would ever be a candidate for valve replacement.  5 history of substance abuse-needs to avoid.  Olga Millers, MD

## 2024-01-23 NOTE — Assessment & Plan Note (Signed)
Echocardiogram with reduced LV systolic function EF 30 to 35% with global hypokinesis, RV systolic function with moderate reduction, RVSP 35, LA with severe dilatation, RA with moderate dilatation, mild mitral stenosis, mild mitral regurgitation (rheumatic), moderate to severe TR, aortic valve with moderate to severe regurgitation.   Continue ultrafiltration as tolerated.  Considering biventricular failure and valvular disease her prognosis is very poor. High risk for hypotension during ultrafiltration,

## 2024-01-23 NOTE — ED Provider Notes (Signed)
[place] EMERGENCY DEPARTMENT AT [facility] Provider Note   CSN: [id number] Arrival date & time: 01/23/24  4098     History  Chief Complaint  Patient presents with   Shortness of Breath    [name] is a 43 y.o. female.  The history is provided by the patient and the EMS personnel.  Shortness of Breath She has history of hypertension, atrial fibrillation anticoagulated on apixaban, combined systolic and diastolic heart failure, end-stage renal disease on hemodialysis comes in because of shortness of breath which got much worse tonight.  She states that she has been having problems with her breathing for the last 2 weeks.  Last dialysis session was yesterday, but she states that no fluid was drawn off because of low blood pressure.  EMS reports oxygen saturation 78% while on oxygen via nasal cannula at 3 L/min.  She has had a cough productive of brown sputum.  She denies fever, chills, sweats.  She denies any nausea or vomiting.   Home Medications Prior to Admission medications   Medication Sig Start Date End Date Taking? Authorizing Provider  acetaminophen (TYLENOL) 500 MG tablet Take 2 tablets (1,000 mg total) by mouth every 8 (eight) hours as needed for mild pain (pain score 1-3). 12/22/23   Monna Fam, MD  albuterol (PROVENTIL) (2.5 MG/3ML) 0.083% nebulizer solution Take 3 mLs (2.5 mg total) by nebulization every 2 (two) hours as needed for wheezing or shortness of breath. 12/06/22   Gwenevere Abbot, MD  apixaban (ELIQUIS) 2.5 MG TABS tablet Take 1 tablet (2.5 mg total) by mouth 2 (two) times daily. 12/22/23   Monna Fam, MD  cinacalcet (SENSIPAR) 30 MG tablet Take 30 mg by mouth every evening. 11/24/22   [provider]  diphenhydrAMINE (BENADRYL) 25 MG tablet Take 1 tablet (25 mg total) by mouth every 4 (four) hours as needed for itching or allergies. Patient taking differently: Take 25 mg by mouth 2 (two) times daily. 01/23/21   Rolly Salter, MD   doxercalciferol (HECTOROL) 4 MCG/2ML injection Use as recommended by nephrology Patient taking differently: 4 mcg Every Tuesday,Thursday,and Saturday with dialysis. Use as recommended by nephrology 11/05/20   Kathlen Mody, MD  loperamide (IMODIUM) 2 MG capsule Take 1 capsule (2 mg total) by mouth as needed for diarrhea or loose stools. 12/22/23   Monna Fam, MD  melatonin 3 MG TABS tablet Take 1 tablet (3 mg total) by mouth at bedtime. 12/22/23   Monna Fam, MD  midodrine (PROAMATINE) 5 MG tablet Take 1 tablet (5 mg total) by mouth 3 (three) times daily with meals. 12/22/23   Monna Fam, MD  multivitamin (RENA-VIT) TABS tablet Take 1 tablet by mouth daily.    [provider]  Nystatin (GERHARDT'S BUTT CREAM) CREA Apply 1 Application topically as needed for irritation. 12/22/23   Monna Fam, MD  nystatin (MYCOSTATIN) 100000 UNIT/ML suspension Take 5 mLs (500,000 Units total) by mouth 4 (four) times daily. 12/22/23   Monna Fam, MD  sevelamer carbonate (RENVELA) 800 MG tablet Take 2,400 mg by mouth 3 (three) times daily. 02/10/22   [provider]      Allergies    Cephalosporins, Tobramycin sulfate, and Vancomycin    Review of Systems   Review of Systems  Respiratory:  Positive for shortness of breath.   All other systems reviewed and are negative.   Physical Exam Updated Vital Signs BP 137/81 (BP Location: Right Arm)   Pulse 70   Resp 16  Ht 5\' 3"  (1.6 m)   Wt 55 kg   BMI 21.48 kg/m  Physical Exam Vitals and nursing note reviewed.   43 year old female, in mild to moderate respiratory distress. Vital signs are normal. Oxygen saturation is 96%, which is normal. Head is normocephalic and atraumatic. PERRLA, EOMI. Oropharynx is clear. Neck is nontender and supple without adenopathy.  JVD is present at 90 degrees. Back is nontender and there is no CVA tenderness.  There is no presacral edema. Lungs have diffuse rales without wheezes or rhonchi. Chest is  nontender. Heart has regular rate and rhythm without murmur. Abdomen is soft, protuberant, nontender. Extremities have no cyanosis or edema, full range of motion is present.  AV fistula is present in the right upper arm with thrill present.  Dressing is over chronic wound of the right proximal thigh-dressing is not removed. Skin is warm and dry without rash. Neurologic: Mental status is normal, cranial nerves are intact, moves all extremities equally.  ED Results / Procedures / Treatments   Labs (all labs ordered are listed, but only abnormal results are displayed) Labs Reviewed  BASIC METABOLIC PANEL - Abnormal; Notable for the following components:      Result Value   Chloride 95 (*)    Glucose, Bld 100 (*)    BUN 39 (*)    Creatinine, Ser 4.56 (*)    Calcium 8.2 (*)    GFR, Estimated 12 (*)    All other components within normal limits  CBC WITH DIFFERENTIAL/PLATELET - Abnormal; Notable for the following components:   RBC 3.38 (*)    Hemoglobin 10.0 (*)    HCT 33.6 (*)    MCHC 29.8 (*)    RDW 20.6 (*)    nRBC 1.0 (*)    Monocytes Absolute 1.1 (*)    All other components within normal limits  MAGNESIUM - Abnormal; Notable for the following components:   Magnesium 2.6 (*)    All other components within normal limits    EKG EKG Interpretation Date/Time:  Sunday January 23 2024 05:53:44 EST Ventricular Rate:  102 PR Interval:    QRS Duration:  109 QT Interval:  447 QTC Calculation: 565 R Axis:   116  Text Interpretation: Atrial flutter Ventricular premature complex Right axis deviation Borderline low voltage, extremity leads Abnormal T, probable ischemia, lateral leads Minimal ST elevation, inferior leads Prolonged QT interval When compared with ECG of 12/16/2024, HEART RATE has decreased Confirmed by Dione Booze ([id number]) on 01/23/2024 6:40:07 AM  Radiology DG Chest Port 1 View Result Date: 01/23/2024 CLINICAL DATA:  43 year old female with shortness of breath and  hypoxia. EXAM: PORTABLE CHEST 1 VIEW COMPARISON:  Portable chest 01/15/2024, 12/22/2023. FINDINGS: Portable AP upright view at stable moderate to severe cardiomegaly, mediastinal contours. Continued lung base hypo ventilation worse on the left side. Since last month the pulmonary vascularity has mildly increased. No pneumothorax or consolidation. Trace fluid or thickening in the right minor fissure is unchanged. Overall ventilation has not significantly changed from 01/15/2024. Visualized tracheal air column is within normal limits. No acute osseous abnormality identified. Paucity of bowel gas. IMPRESSION: Cardiomegaly, small pleural effusions, and pulmonary edema not significantly changed since 01/15/2024. Electronically Signed   By: [name] M.D.   On: 01/23/2024 06:35    Procedures Procedures  Cardiac monitor shows atrial fibrillation, per my interpretation.  Medications Ordered in ED Medications - No data to display  ED Course/ Medical Decision Making/ A&P  Medical Decision Making Amount and/or Complexity of Data Reviewed Labs: ordered. Radiology: ordered.  Risk Decision regarding hospitalization.   Acute dyspnea with hypoxia.  Exam is strongly suggestive of pulmonary edema.  Consider pneumonia.  No evidence of bronchospasm on exam.  Doubt pneumothorax.  Ongoing atrial fibrillation/atrial flutter may also be playing a component.  I have ordered laboratory workup of CBC, basic metabolic panel, magnesium level.  I have ordered a chest x-ray and electrocardiogram.  Because she appears to be tiring from her shortness of breath, I have decided to put her on BiPAP until fluid can be drawn off at dialysis.  I have reviewed her past records, and she was admitted at [facility] on 01/08/2024-01/21/2024 for atrial fibrillation.  I have reviewed her laboratory tests, and my interpretation is elevated BUN and creatinine consistent with known  history of end-stage renal disease, stable anemia, mildly elevated serum magnesium level.  Chest x-ray shows cardiomegaly and pulmonary edema.  I have independently viewed the image, and agree with radiologist's interpretation.  I have discussed case with Dr. Arlean Hopping of nephrology service who agrees to do emergent dialysis.  CRITICAL CARE Performed by: Dione Booze Total critical care time: 45 minutes Critical care time was exclusive of separately billable procedures and treating other patients. Critical care was necessary to treat or prevent imminent or life-threatening deterioration. Critical care was time spent personally by me on the following activities: development of treatment plan with patient and/or surrogate as well as nursing, discussions with consultants, evaluation of patient's response to treatment, examination of patient, obtaining history from patient or surrogate, ordering and performing treatments and interventions, ordering and review of laboratory studies, ordering and review of radiographic studies, pulse oximetry and re-evaluation of patient's condition.  Final Clinical Impression(s) / ED Diagnoses Final diagnoses:  Acute pulmonary edema (HCC)  Acute on chronic respiratory failure with hypoxia (HCC)  End-stage renal disease on hemodialysis (HCC)  Atrial fibrillation, unspecified type (HCC)  Anemia associated with chronic renal failure    Rx / DC Orders ED Discharge Orders     None         Dione Booze, MD 01/23/24 [phone]

## 2024-01-23 NOTE — Assessment & Plan Note (Signed)
   Hypotension ACTH stimulation test has been ordered by nephrology for the a.m., have ordered another blood culture to rule out subacute endocarditis given past bacteremia, we will support with midodrine 30 mg with meals Maintaining a systolic greater than 90 off of any pressors and mentating well, do not suspect sepsis at this time

## 2024-01-23 NOTE — ED Provider Notes (Signed)
  Physical Exam  BP (!) 108/58   Pulse 70   Resp 16   Ht 5\' 3"  (1.6 m)   Wt 55 kg   BMI 21.48 kg/m   Physical Exam  Procedures  Procedures  ED Course / MDM    Medical Decision Making Amount and/or Complexity of Data Reviewed Labs: ordered. Radiology: ordered.  Risk Decision regarding hospitalization.   79F with HX of ESRD on HD here with hypoxia, waiting on dialysis. Renal consulted. Will need reassessment after dialysis.  Discussed with on-call nephrology, Dr. Arlean Hopping. No immediate availability for dialysis at this time, will plan for admission for likely dialysis tonight. Hospitalist medicine consulted for admission.   Patient is hemodynamically stable on 3 L O2 via nasal cannula, evidence of pulmonary edema, needs dialysis to be scheduled for tonight.  Has issues with hypertension and A-fib with RVR during dialysis, per nephrology, would appreciate nonemergent cardiology input.  Hospitalist medicine consulted for admission.      Ernie Avena, MD 01/23/24 1302

## 2024-01-23 NOTE — ED Triage Notes (Signed)
Pt BIB [facility] from [place] [facility] c/o SOB. Sudden onset around 0300; on EMS arrival pt was 73% on 3L, Neb treatment given and pts o2 increased to 85%.  HR 150s. Hx of dialysis and CHF

## 2024-01-23 NOTE — Assessment & Plan Note (Signed)
   End stage renal disease BUN is 39 no signs of uremia at this time we will continue Sensipar 30 mg and Renvela 2400 mg and nephrology consultation Plan for hemodialysis tonight

## 2024-01-23 NOTE — Consult Note (Signed)
Renal Service Consult Note [facility]  TANISE RUSSMAN 01/23/2024 [name], MD Requesting Physician: Dr. Karene Fry  Reason for Consult: ESRD pt w/ SOB HPI: The patient is a 43 y.o. year-old w/ PMH as below who presented to ED from SNF early this am w/ SOB. SpO2 was 73% per EMS, given nebs and O2 w/ ^ to 85%. HR 150s. Hx of esrd on HD and CHF and afib. CXR showed mild pulm edema. We were asked to see for ED HD.    Pt seen in ED. Pt was recently dc'd from [facility] where they were treating her for fluid overload and rapid afib (per the pt). While there she says they had problems pulling fluid due to high heart rates. We dc'd to [facility] on 1/24 (Friday) and was supposed to be taking amio/ metoprolol and other medications but state she hasn't gotten any medications at the SNF. She went to her HD yesterday at [facility] but they "couldn't pull any fluid because my heart rate was too high". This morning she states she is feeling much better w/ the [place] O2.   In ED BP 100 /70, HR 105, RR 25.  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 She has been in and out of [facility], the SNF , and [facility] since early dec 2024.  She will need admission to hospital, ED HD is not appropriate given her comorbidities at this time, have d/w ED MD. We have no staff available right now for HD. Pt is stable at the moment so will plan for HD this evening when we have a scheduled staff member.    ROS - denies CP, no joint pain, no HA, no blurry vision, no rash, no diarrhea, no nausea/ vomiting, no dysuria, no difficulty voiding   Past Medical History  Past Medical History:  Diagnosis Date   Anemia    low iron - receives iron at dialysis   Anxiety    Arthritis    RA   Atrial fibrillation with RVR (HCC) 12/14/2023   Chronic systolic congestive heart failure (HCC) 03/16/2016   Dyspnea    ESRD (end  stage renal disease) (HCC)    [facility] _ [place]   H/O pericarditis 01/17/2013   H/O pleural effusion 01/17/2013   Heart murmur    Lupus (systemic lupus erythematosus) (HCC)    Previously followed with Dr. 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 pneumonae bacteremia on 01/21/2013. Sensitive to Peniccilin    Past Surgical History  Past Surgical History:  Procedure Laterality Date   A/V FISTULAGRAM N/A 12/15/2023   Procedure: A/V Fistulagram;  Surgeon: Ethelene Hal, MD;  Location: [facility] INVASIVE CV LAB;  Service: Cardiovascular;  Laterality: N/A;   AV FISTULA PLACEMENT     AV FISTULA PLACEMENT Right 08/01/2020   Procedure: RIGHT ARM BRACHIOCEPHALIC  ARTERIOVENOUS (AV) FISTULA CREATION;  Surgeon: Larina Earthly, MD;  Location: [facility] OR;  Service: Vascular;  Laterality: Right;   BASCILIC VEIN TRANSPOSITION Left 02/05/2014   Procedure: BASCILIC VEIN TRANSPOSITION;  Surgeon: Larina Earthly, MD;  Location: [facility] OR;  Service: Vascular;  Laterality: Left;   BASCILIC VEIN TRANSPOSITION Right 01/08/2021   Procedure: RIGHT ARM SECOND STAGE BASCILIC VEIN TRANSPOSITION;  Surgeon: Larina Earthly, MD;  Location: [facility] OR;  Service: Vascular;  Laterality: Right;   BIOPSY  12/17/2021   Procedure: BIOPSY;  Surgeon: Lemar Lofty., MD;  Location: [facility] ENDOSCOPY;  Service: Gastroenterology;;   ESOPHAGOGASTRODUODENOSCOPY (EGD) WITH PROPOFOL N/A 12/17/2021   Procedure: ESOPHAGOGASTRODUODENOSCOPY (EGD) WITH PROPOFOL;  Surgeon:  Lemar Lofty., MD;  Location: [facility] ENDOSCOPY;  Service: Gastroenterology;  Laterality: N/A;   FISTULA SUPERFICIALIZATION Left 05/30/2018   Procedure: FISTULA PLICATION BASILIC VEIN TRANSPOSITION;  Surgeon: Chuck Hint, MD;  Location: [facility] OR;  Service: Vascular;  Laterality: Left;   FISTULA SUPERFICIALIZATION Left 12/18/2019   Procedure: PLICATION OF LEFT ARTERIOVENOUS FISTULA ULCER;  Surgeon: Larina Earthly, MD;  Location: [facility] OR;  Service: Vascular;  Laterality: Left;   I & D EXTREMITY Right 02/18/2021   Procedure: IRRIGATION AND DEBRIDEMENT OF ARM;  Surgeon: Chuck Hint, MD;  Location: [facility] OR;  Service: Vascular;  Laterality: Right;   INSERTION OF DIALYSIS CATHETER N/A 05/19/2020   Procedure: TUNNELED INSERTION  OF DIALYSIS CATHETER;  Surgeon: Maeola Harman, MD;  Location: [facility] OR;  Service: Vascular;  Laterality: N/A;   PERIPHERAL VASCULAR BALLOON ANGIOPLASTY  12/15/2023   Procedure: PERIPHERAL VASCULAR BALLOON ANGIOPLASTY;  Surgeon: Ethelene Hal, MD;  Location: [facility] INVASIVE CV LAB;  Service: Cardiovascular;;   THROMBECTOMY AND REVISION OF ARTERIOVENTOUS (AV) GORETEX  GRAFT Left 07/28/2020   Procedure: Oversewing of left arm Brachial cephalic fistula for bleeding.;  Surgeon: Chuck Hint, MD;  Location: [facility] OR;  Service: Vascular;  Laterality: Left;   VENOGRAM Right 01/31/2014   Procedure: DIALYSIS CATHETER;  Surgeon: Nada Libman, MD;  Location: [facility] CATH LAB;  Service: Cardiovascular;  Laterality: Right;   Family History No family history on file. Social History  reports that she has been smoking cigarettes. She has a 1.8 pack-year smoking history. She has been exposed to tobacco smoke. She has never used smokeless tobacco. She reports current alcohol use. She reports current drug use. Drug: Marijuana. Allergies  Allergies  Allergen Reactions   Cephalosporins Rash    To both keflex and cefazolin.  Potential bullous lesion from Ceftriaxone 11/2023.    Tobramycin Sulfate Swelling    Eye swelling   Vancomycin Swelling   Home medications Prior to Admission medications   Medication Sig Start Date End Date Taking? Authorizing Provider  acetaminophen (TYLENOL) 500 MG tablet Take 2 tablets (1,000 mg total) by mouth every 8 (eight) hours as needed for mild pain (pain score 1-3). 12/22/23   Monna Fam, MD  albuterol (PROVENTIL) (2.5 MG/3ML) 0.083% nebulizer solution Take 3 mLs (2.5 mg total) by nebulization every 2 (two) hours as needed for wheezing or shortness of breath. 12/06/22   Gwenevere Abbot, MD  apixaban (ELIQUIS) 2.5 MG TABS tablet Take 1 tablet (2.5 mg total) by mouth 2 (two) times daily. 12/22/23   Monna Fam, MD  cinacalcet (SENSIPAR) 30 MG tablet Take 30 mg by mouth every evening. 11/24/22   [provider]  diphenhydrAMINE (BENADRYL) 25 MG tablet Take 1 tablet (25 mg total) by mouth every 4 (four) hours as needed  for itching or allergies. Patient taking differently: Take 25 mg by mouth 2 (two) times daily. 01/23/21   Rolly Salter, MD  doxercalciferol (HECTOROL) 4 MCG/2ML injection Use as recommended by nephrology Patient taking differently: 4 mcg Every Tuesday,Thursday,and Saturday with dialysis. Use as recommended by nephrology 11/05/20   Kathlen Mody, MD  loperamide (IMODIUM) 2 MG capsule Take 1 capsule (2 mg total) by mouth as needed for diarrhea or loose stools. 12/22/23   Monna Fam, MD  melatonin 3 MG TABS tablet Take 1 tablet (3 mg total) by mouth at bedtime. 12/22/23   Monna Fam, MD  midodrine (PROAMATINE) 5 MG tablet Take 1 tablet (5 mg total) by mouth 3 (three) times daily with meals. 12/22/23   Monna Fam, MD  multivitamin (RENA-VIT) TABS tablet Take 1 tablet by mouth daily.    [provider]  Nystatin (GERHARDT'S BUTT CREAM) CREA Apply 1 Application topically as needed for irritation. 12/22/23   Monna Fam, MD  nystatin (MYCOSTATIN) 100000 UNIT/ML suspension Take 5 mLs (500,000 Units total) by  mouth 4 (four) times daily. 12/22/23   Monna Fam, MD  sevelamer carbonate (RENVELA) 800 MG tablet Take 2,400 mg by mouth 3 (three) times daily. 02/10/22   [provider]     Vitals:   01/23/24 4098 01/23/24 1191 01/23/24 0545 01/23/24 0559  BP: 137/81  (!) 108/58   Pulse:    70  Resp: (!) 23  (!) 25 16  Weight:  55 kg    Height:  5\' 3"  (1.6 m)     Exam Gen alert, no distress, frail, BP 100/ 70, [place] O2 No rash, cyanosis or gangrene Sclera anicteric, throat clear  No jvd or bruits Chest bilat basilar crackles, no ^wob RRR no MRG Abd soft ntnd no mass or ascites +bs GU defer MS no joint effusions or deformity Ext no pitting LE or UE edema, no other edema Neuro is alert, Ox 3 , nf    RUA AVF+bruit    Renal-related home meds: - sensipar 30 hs - midodrine 5 tid - renavite - renvela 3 ac tid    [facility] [place] [facility]   B400   45kg   2/2 bath  R AVF   Heparin none - last HD 1/25, post wt 46.2, gained 0.5kg during HD - hectorol 6 mcg tts - no esa, last hb 10.5   Assessment/ Plan: Acute hypoxic resp failure - w/ mild pulm edema on CXR. Suspect vol overload. However she is having issues w/ atrial fib/ RVR and soft BP's limiting UF on HD. Will ^midodrine to 10 tid. Pt will need admission and probably will need cardiology input also. Have d/w ED MD.  ESRD - on HD TTS. Last OP HD yesterday. Plan HD this evening w/ midodrine support.  BP - chronic hypotension on midodrine. Will get ACTH stim test in the morning.   Anemia of esrd - Hb 10, not on esa at op unit. Follow.  Secondary hyperparathyroidism - Ca in range, add on phos/ alb. Cont binders w/ meals.  H/o SLE      Vinson Moselle  MD [facility] 01/23/2024, 9:12 AM  Recent Labs  Lab 01/23/24 0530  HGB 10.0*  CALCIUM 8.2*  CREATININE 4.56*  K 3.5   Inpatient medications:

## 2024-01-24 DIAGNOSIS — J9601 Acute respiratory failure with hypoxia: Secondary | ICD-10-CM

## 2024-01-24 DIAGNOSIS — I4892 Unspecified atrial flutter: Secondary | ICD-10-CM | POA: Diagnosis not present

## 2024-01-24 LAB — CBC
HCT: 29.8 % — ABNORMAL LOW (ref 36.0–46.0)
Hemoglobin: 9 g/dL — ABNORMAL LOW (ref 12.0–15.0)
MCH: 29.4 pg (ref 26.0–34.0)
MCHC: 30.2 g/dL (ref 30.0–36.0)
MCV: 97.4 fL (ref 80.0–100.0)
Platelets: 258 10*3/uL (ref 150–400)
RBC: 3.06 MIL/uL — ABNORMAL LOW (ref 3.87–5.11)
RDW: 21 % — ABNORMAL HIGH (ref 11.5–15.5)
WBC: 6.9 10*3/uL (ref 4.0–10.5)
nRBC: 0.7 % — ABNORMAL HIGH (ref 0.0–0.2)

## 2024-01-24 LAB — ACTH STIMULATION, 3 TIME POINTS
Cortisol, 30 Min: 26.1 ug/dL
Cortisol, 60 Min: 31.6 ug/dL
Cortisol, Base: 19.2 ug/dL

## 2024-01-24 MED ORDER — HEPARIN SODIUM (PORCINE) 5000 UNIT/ML IJ SOLN
5000.0000 [IU] | Freq: Three times a day (TID) | INTRAMUSCULAR | Status: DC
  Administered 2024-01-24 – 2024-01-30 (×14): 5000 [IU] via SUBCUTANEOUS
  Filled 2024-01-24 (×16): qty 1

## 2024-01-24 MED ORDER — DIPHENHYDRAMINE HCL 50 MG/ML IJ SOLN
INTRAMUSCULAR | Status: AC
  Filled 2024-01-24: qty 1

## 2024-01-24 MED ORDER — PHENOL 1.4 % MT LIQD
1.0000 | OROMUCOSAL | Status: DC | PRN
  Administered 2024-01-24: 1 via OROMUCOSAL
  Filled 2024-01-24: qty 177

## 2024-01-24 MED ORDER — DIPHENHYDRAMINE HCL 50 MG/ML IJ SOLN
25.0000 mg | Freq: Once | INTRAMUSCULAR | Status: AC
  Administered 2024-01-24: 25 mg via INTRAVENOUS
  Filled 2024-01-24: qty 1

## 2024-01-24 MED ORDER — OXYCODONE HCL 5 MG PO TABS
5.0000 mg | ORAL_TABLET | ORAL | Status: DC | PRN
  Administered 2024-01-24 – 2024-01-30 (×23): 5 mg via ORAL
  Filled 2024-01-24 (×24): qty 1

## 2024-01-24 MED ORDER — BENZONATATE 100 MG PO CAPS
200.0000 mg | ORAL_CAPSULE | Freq: Three times a day (TID) | ORAL | Status: DC | PRN
  Administered 2024-01-24 – 2024-01-30 (×6): 200 mg via ORAL
  Filled 2024-01-24 (×7): qty 2

## 2024-01-24 NOTE — Progress Notes (Signed)
TR[facility] night cross cover note:   I was notified by RN of the patient's request for prn oxycodone IR for wound related discomfort associate with the right lower extremity, with existing pain refractory to acetaminophen.  I subsequently placed order for oxycodone IR 5 mg p.o. every 4 hours for pain.     [name] Pigg, DO Hospitalist

## 2024-01-24 NOTE — Progress Notes (Addendum)
PROGRESS NOTE    [name]  YSA:[id number] DOB: 1981-11-16 DOA: 01/23/2024 PCP: [name], NP     Brief Narrative:   From admission h and p  43 year old female with a past medical history of end-stage renal disease on HD, heart failure with reduced ejection fraction chronic, lupus with nephritis by history, recent discharge from [facility] January 2025 following admission for acute respiratory failure as well as volume overload.the patient reports no fever no chills however reports increasing shortness of breath and expresses concern that she has not been receiving her medications or supplemental oxygen regularly at her skilled nursing facility.  She denies any changes in her wounds reports she believes these are improving she did not want to take off the bandage however showed me a picture on her phone of her right thigh wound.  She denies any chest pain or palpitations. Review of the patient's discharge summary from [facility] her stay and dialysis was complicated limiting UF by a flutter with rapid ventricular response and hypotension during that stay she also had influenza A they attempted to introduce beta-blockade however had to abandon this plan given hypotension and  discharged her on amiodarone with a plan on January 30 to go up to 200 mg daily.  Also reviewed the patient's discharge summary from December 2025 from last year where she had sepsis secondary to Klebsiella bacteremia and developed a right thigh wound at that time the differential mentioned as either lupus or drug allergy in the discharge summary.  Nephrology has consulted on the patient in the emergency department and plans for inpatient hemodialysis later today.    Assessment & Plan:   Principal Problem:   Acute respiratory failure (HCC) Active Problems:   Substance abuse (HCC)   Atrial flutter (HCC)   Acute on chronic systolic CHF (congestive heart failure) (HCC)   Hypotension   ESRD (end stage  renal disease) (HCC)   Lupus (systemic lupus erythematosus) (HCC)   Anemia due to chronic kidney disease   Thrombocytopenia (HCC)   Secondary hypercoagulability disorder (HCC)   Open thigh wound   Abnormal ECG   # Acute hypoxic respiratory failure on chronic hypoxia On 2 liters at home, dyspneic and requiring 3 liters on admission. Cxr with pulm edema, dyspnea/hypoxia likely mediated by volume overloaded, a fib rvr. S/p dialysis yesterday with UF, now weaned to 2 liters - continue [place] O2  # A-flutter/fib Cardiology following, rates in the 110s currently - continue amio, toprol - not anticoag candidate per cardiology given recent falls, hx subdural  # HFrEF Most recent ef 30-35 with moderate mr, mod to severe aortic insufficiency, mod to severe tricuspid regurg - cont toprol  # Hypotension Acth stim negative - continue midodrine  # ESRD On tts hemodialysis at baseline, received extra session yesterday - for hemodialysis tomorrow  # Right thigh ulcer Present for 2 months. Not particularly painful so less likely calciphylaxis. I reached out to Dr. Lajoyce Corners today, per secure chat with him, "These are wounds I could help with, however I am out of town on Tuesday Wednesday and Thursday. I could post for surgery Friday if she is still hospitalized. Otherwise I can follow-up in the office." - see photo in media tab - wound care also following  # SLE Causing lupus nephritis, not on immune suppressant  # Dispo Resides at snf - pt consult as may need that to return   DVT prophylaxis: heparin Code Status: full Family Communication: sister updated @  bedside 1/27  Level of care: Telemetry Cardiac Status is: Inpatient Remains inpatient appropriate because: severity of illness    Consultants:  Nephrology, cardiology  Procedures: hemodialysis  Antimicrobials:  none    Subjective: Reports feeling about at baseline, dyspnea improved  Objective: Vitals:   01/24/24 1015  01/24/24 1100 01/24/24 1300 01/24/24 1557  BP: (!) 87/46 (!) 93/43 110/61 110/61  Pulse:  97 (!) 114 (!) 113  Resp:  15 20 17   Temp:  97.6 F (36.4 C) 97.6 F (36.4 C) 98.3 F (36.8 C)  TempSrc:  Oral  Oral  SpO2:  100% 100% 99%  Weight:      Height:        Intake/Output Summary (Last 24 hours) at 01/24/2024 1706 Last data filed at 01/24/2024 0701 Gross per 24 hour  Intake --  Output 2500 ml  Net -2500 ml   Filed Weights   01/23/24 0538 01/24/24 0703  Weight: 55 kg 46.9 kg    Examination:  General exam: Appears calm and comfortable , chronically ill appearing Respiratory system: rales at bases Cardiovascular system: tachycardic, irreg irreg Gastrointestinal system: Abdomen is nondistended, soft and nontender.   Central nervous system: Alert and oriented. No focal neurological deficits. Extremities: Symmetric 5 x 5 power. Skin:  Psychiatry: Judgement and insight appear normal. Mood & affect appropriate.     Data Reviewed: I have personally reviewed following labs and imaging studies  CBC: Recent Labs  Lab 01/23/24 0530 01/24/24 1033  WBC 9.1 6.9  NEUTROABS 5.0  --   HGB 10.0* 9.0*  HCT 33.6* 29.8*  MCV 99.4 97.4  PLT 268 258   Basic Metabolic Panel: Recent Labs  Lab 01/23/24 0530  NA 138  K 3.5  CL 95*  CO2 28  GLUCOSE 100*  BUN 39*  CREATININE 4.56*  CALCIUM 8.2*  MG 2.6*  PHOS 4.5   GFR: Estimated Creatinine Clearance: 12.1 mL/min (A) (by C-G formula based on SCr of 4.56 mg/dL (H)). Liver Function Tests: Recent Labs  Lab 01/23/24 0530  ALBUMIN 2.7*   No results for input(s): "LIPASE", "AMYLASE" in the last 168 hours. No results for input(s): "AMMONIA" in the last 168 hours. Coagulation Profile: No results for input(s): "INR", "PROTIME" in the last 168 hours. Cardiac Enzymes: No results for input(s): "CKTOTAL", "CKMB", "CKMBINDEX", "TROPONINI" in the last 168 hours. BNP (last 3 results) No results for input(s): "PROBNP" in the last 8760  hours. HbA1C: No results for input(s): "HGBA1C" in the last 72 hours. CBG: Recent Labs  Lab 01/23/24 0715  GLUCAP 79   Lipid Profile: No results for input(s): "CHOL", "HDL", "LDLCALC", "TRIG", "CHOLHDL", "LDLDIRECT" in the last 72 hours. Thyroid Function Tests: No results for input(s): "TSH", "T4TOTAL", "FREET4", "T3FREE", "THYROIDAB" in the last 72 hours. Anemia Panel: No results for input(s): "VITAMINB12", "FOLATE", "FERRITIN", "TIBC", "IRON", "RETICCTPCT" in the last 72 hours. Urine analysis:    Component Value Date/Time   COLORURINE YELLOW 09/25/2015 1412   APPEARANCEUR CLOUDY (A) 09/25/2015 1412   LABSPEC 1.018 09/25/2015 1412   PHURINE 8.0 09/25/2015 1412   GLUCOSEU NEGATIVE 09/25/2015 1412   HGBUR LARGE (A) 09/25/2015 1412   BILIRUBINUR NEGATIVE 09/25/2015 1412   KETONESUR NEGATIVE 09/25/2015 1412   PROTEINUR >300 (A) 09/25/2015 1412   UROBILINOGEN 0.2 09/25/2015 1412   NITRITE NEGATIVE 09/25/2015 1412   LEUKOCYTESUR MODERATE (A) 09/25/2015 1412   Sepsis Labs: @LABRCNTIP (procalcitonin:4,lacticidven:4)  ) Recent Results (from the past 240 hours)  Culture, blood (Routine X 2) w Reflex to ID  Panel     Status: None (Preliminary result)   Collection Time: 01/23/24  1:04 PM   Specimen: BLOOD LEFT WRIST  Result Value Ref Range Status   Specimen Description BLOOD LEFT WRIST  Final   Special Requests   Final    BOTTLES DRAWN AEROBIC AND ANAEROBIC Blood Culture results may not be optimal due to an inadequate volume of blood received in culture bottles   Culture   Final    NO GROWTH < 24 HOURS Performed at [facility], [address], [place], [place] [address]    Report Status PENDING  Incomplete  Culture, blood (Routine X 2) w Reflex to ID Panel     Status: None (Preliminary result)   Collection Time: 01/23/24  1:09 PM   Specimen: BLOOD  Result Value Ref Range Status   Specimen Description BLOOD LEFT ANTECUBITAL  Final   Special Requests   Final    BOTTLES  DRAWN AEROBIC AND ANAEROBIC Blood Culture results may not be optimal due to an inadequate volume of blood received in culture bottles   Culture   Final    NO GROWTH < 24 HOURS Performed at [facility], [address] [place], [place] [address]    Report Status PENDING  Incomplete         Radiology Studies: DG Chest Port 1 View Result Date: 01/23/2024 CLINICAL DATA:  43 year old female with shortness of breath and hypoxia. EXAM: PORTABLE CHEST 1 VIEW COMPARISON:  Portable chest 01/15/2024, 12/22/2023. FINDINGS: Portable AP upright view at stable moderate to severe cardiomegaly, mediastinal contours. Continued lung base hypo ventilation worse on the left side. Since last month the pulmonary vascularity has mildly increased. No pneumothorax or consolidation. Trace fluid or thickening in the right minor fissure is unchanged. Overall ventilation has not significantly changed from 01/15/2024. Visualized tracheal air column is within normal limits. No acute osseous abnormality identified. Paucity of bowel gas. IMPRESSION: Cardiomegaly, small pleural effusions, and pulmonary edema not significantly changed since 01/15/2024. Electronically Signed   By: [name] M.D.   On: 01/23/2024 06:35        Scheduled Meds:  amiodarone  200 mg Oral Daily   Chlorhexidine Gluconate Cloth  6 each Topical Q0600   cinacalcet  30 mg Oral Q breakfast   leptospermum manuka honey  1 Application Topical Daily   melatonin  3 mg Oral QHS   midodrine  10 mg Oral TID WC   midodrine  10 mg Oral Once in dialysis   multivitamin  1 tablet Oral QHS   sevelamer carbonate  2,400 mg Oral TID WC   Continuous Infusions:   LOS: 1 day     Silvano Bilis, MD [facility]   If 7PM-7AM, please contact night-coverage [email or web address] Password [facility] 01/24/2024, 5:06 PM

## 2024-01-24 NOTE — Progress Notes (Signed)
Nephrology Follow-Up Consult note   Assessment/Recommendations: [name] is a/an 43 y.o. female with a past medical history significant for ESRD, admitted for acute hypoxic respiratory failure.        OP HD: [place] [facility] 3h   B400   45kg   2/2 bath  R AVF   Heparin none - last HD 1/25, post wt 46.2, gained 0.5kg during HD - hectorol 6 mcg tts - no esa, last hb 10.5     Assessment/ Plan: Acute hypoxic resp failure - w/ mild pulm edema on CXR. Suspect vol overload. However she is having issues w/ atrial fib/ RVR and soft BP's limiting UF on HD. Cont with midodrine 10mg  TID ESRD - on HD TTS. HD overnight. Plan again tomorrow w/ UF as tolerated.  BP - chronic hypotension on midodrine. F/u stim test results Anemia of esrd - Hb 10, not on esa at op unit. CTM Secondary hyperparathyroidism - CCa and phos acceptable. Cont binders w/ meals. Afib w/ RVR: per primary and cards  H/o SLE   Recommendations conveyed to primary service.    Darnell Level [facility] 01/24/2024 9:43 AM  ___________________________________________________________  CC: Shortness of breath  Interval History/Subjective: Patient underwent dialysis this morning.  States that her shortness of breath is much better but still has some fluid on.   Medications:  Current Facility-Administered Medications  Medication Dose Route Frequency Provider Last Rate Last Admin   acetaminophen (TYLENOL) tablet 1,000 mg  1,000 mg Oral Q8H PRN Core, Doy Hutching, MD   1,000 mg at 01/23/24 2002   albuterol (PROVENTIL) (2.5 MG/3ML) 0.083% nebulizer solution 2.5 mg  2.5 mg Nebulization Q2H PRN Core, Doy Hutching, MD   2.5 mg at 01/23/24 2149   amiodarone (PACERONE) tablet 200 mg  200 mg Oral Daily Core, Doy Hutching, MD   200 mg at 01/23/24 1449   Chlorhexidine Gluconate Cloth 2 % PADS 6 each  6 each Topical Q0600 Core, Doy Hutching, MD   6 each at 01/24/24 0808   cinacalcet (SENSIPAR) tablet 30 mg  30 mg Oral Q breakfast  Core, Doy Hutching, MD       cosyntropin (CORTROSYN) injection 0.25 mg  0.25 mg Intravenous Once Core, Doy Hutching, MD       diphenhydrAMINE (BENADRYL) 50 MG/ML injection            diphenhydrAMINE (BENADRYL) injection 25 mg  25 mg Intravenous Once Delano Metz, MD       leptospermum manuka honey (MEDIHONEY) paste 1 Application  1 Application Topical Daily Core, Doy Hutching, MD   1 Application at 01/23/24 2210   melatonin tablet 3 mg  3 mg Oral QHS Core, Doy Hutching, MD   3 mg at 01/23/24 2210   metoprolol succinate (TOPROL-XL) 24 hr tablet 12.5 mg  12.5 mg Oral Daily Parcells, Taylor A, PA-C   12.5 mg at 01/23/24 1600   midodrine (PROAMATINE) tablet 10 mg  10 mg Oral TID WC Core, Doy Hutching, MD   10 mg at 01/23/24 1600   midodrine (PROAMATINE) tablet 10 mg  10 mg Oral Once in dialysis Core, Doy Hutching, MD       multivitamin (RENA-VIT) tablet 1 tablet  1 tablet Oral QHS Core, Doy Hutching, MD       sevelamer carbonate (RENVELA) tablet 2,400 mg  2,400 mg Oral TID WC Core, Doy Hutching, MD   2,400 mg at 01/23/24 1600      Review of Systems: 10 systems  reviewed and negative except per interval history/subjective  Physical Exam: Vitals:   01/24/24 0701 01/24/24 0800  BP: (!) 108/51   Pulse: (!) 45 100  Resp: 11   Temp: 97.9 F (36.6 C) 97.6 F (36.4 C)  SpO2: (!) 81% 100%   Total I/O In: -  Out: 2500 [Other:2500]  Intake/Output Summary (Last 24 hours) at 01/24/2024 1610 Last data filed at 01/24/2024 0701 Gross per 24 hour  Intake --  Output 2500 ml  Net -2500 ml   Constitutional: Chronically ill-appearing, lying in bed, no distress ENMT: ears and nose without scars or lesions, MMM CV: normal rate, no edema Respiratory: Bilateral chest rise with no increased work of breathing Gastrointestinal: soft, non-tender, no palpable masses or hernias Skin: no visible lesions or rashes Psych: alert, judgement/insight appropriate, appropriate mood and affect   Test Results I personally reviewed new and old  clinical labs and radiology tests Lab Results  Component Value Date   NA 138 01/23/2024   K 3.5 01/23/2024   CL 95 (L) 01/23/2024   CO2 28 01/23/2024   BUN 39 (H) 01/23/2024   CREATININE 4.56 (H) 01/23/2024   CALCIUM 8.2 (L) 01/23/2024   ALBUMIN 2.7 (L) 01/23/2024   PHOS 4.5 01/23/2024    CBC Recent Labs  Lab 01/23/24 0530  WBC 9.1  NEUTROABS 5.0  HGB 10.0*  HCT 33.6*  MCV 99.4  PLT 268

## 2024-01-24 NOTE — TOC Transition Note (Signed)
Transition of Care [facility]) - Discharge Note   Patient Details  Name: [name] MRN: [id number] Date of Birth: [birth year]  Transition of Care [facility] CM/SW Contact:  Eduard Roux, LCSW Phone Number: 01/24/2024, 2:37 PM   Clinical Narrative:     CSW met with patient at bedside. She confirmed, she arrived here from [facility]. She is agreeable to returning if recommended. Per chart review patient was d/c from hospital on 12/25 to [facility] w/ 30 day LOG. CSW sent message to [facility] Babette Relic to inquire if the  patient could return if recommended for SNF and if pre-authorization is required. CSW was able to speak with [facility], he will follow up and advise CSW- waiting on response   Antony Blackbird, MSW, LCSW Clinical Social Worker    Final next level of care: Skilled Nursing Facility Barriers to Discharge: Continued Medical Work up   Patient Goals and CMS Choice            Discharge Placement                       Discharge Plan and Services Additional resources added to the After Visit Summary for   In-house Referral: Clinical Social Work                                   Social Drivers of Health (SDOH) Interventions SDOH Screenings   Food Insecurity: Low Risk  (01/08/2024)   Received from [facility]  Housing: Low Risk  (01/08/2024)   Received from [facility]  Transportation Needs: No Transportation Needs (01/08/2024)   Received from [facility]  Utilities: Low Risk  (01/08/2024)   Received from [facility]  Depression (PHQ2-9): Low Risk  (04/29/2022)  Tobacco Use: High Risk (12/13/2023)     Readmission Risk Interventions    04/17/2022    8:36 AM  Readmission Risk Prevention Plan  Transportation Screening Complete  PCP or Specialist Appt within 3-5 Days Complete  HRI or Home Care Consult Complete  Social Work Consult for Recovery Care Planning/Counseling Complete  Palliative Care Screening Not  Applicable  Medication Review [employer]) Complete

## 2024-01-24 NOTE — Progress Notes (Signed)
Rounding Note    Patient Name: [name] Date of Encounter: 01/24/2024  [facility] Cardiologist: Jodelle Red, MD   Subjective   She has no issues this AM  Inpatient Medications    Scheduled Meds:  amiodarone  200 mg Oral Daily   Chlorhexidine Gluconate Cloth  6 each Topical Q0600   cinacalcet  30 mg Oral Q breakfast   cosyntropin  0.25 mg Intravenous Once   diphenhydrAMINE       diphenhydrAMINE  25 mg Intravenous Once   leptospermum manuka honey  1 Application Topical Daily   melatonin  3 mg Oral QHS   metoprolol succinate  12.5 mg Oral Daily   midodrine  10 mg Oral TID WC   midodrine  10 mg Oral Once in dialysis   multivitamin  1 tablet Oral QHS   sevelamer carbonate  2,400 mg Oral TID WC   Continuous Infusions:  PRN Meds: acetaminophen, albuterol, diphenhydrAMINE   Vital Signs    Vitals:   01/24/24 0602 01/24/24 0645 01/24/24 0701 01/24/24 0800  BP: (!) 97/46 104/60 (!) 108/51   Pulse: 95  (!) 45 100  Resp: 18 14 11    Temp:   97.9 F (36.6 C) 97.6 F (36.4 C)  TempSrc:   Oral Oral  SpO2: 100%  (!) 81% 100%  Weight:      Height:        Intake/Output Summary (Last 24 hours) at 01/24/2024 0917 Last data filed at 01/24/2024 0701 Gross per 24 hour  Intake 240 ml  Output 2500 ml  Net -2260 ml      01/23/2024    5:38 AM 12/18/2023    8:00 PM 12/16/2023    2:36 PM  Last 3 Weights  Weight (lbs) 121 lb 4.1 oz 125 lb 10.6 oz 121 lb 4.1 oz   Weight (kg) 55 kg 57 kg 55 kg      Significant value      Telemetry    Sinus tachycardia - Personally Reviewed  ECG    No new- Personally Reviewed  Physical Exam   Vitals:   01/24/24 0701 01/24/24 0800  BP: (!) 108/51   Pulse: (!) 45 100  Resp: 11   Temp: 97.9 F (36.6 C) 97.6 F (36.4 C)  SpO2: (!) 81% 100%    GEN: No acute distress.  frail Neck: No JVD Cardiac: RRR, + holosystolic murmur Respiratory: Clear to auscultation bilaterally. GI: Soft, nontender,  non-distended  Ext: fistula MS: No edema; No deformity. Neuro:  Nonfocal  Psych: Normal affect   Labs    High Sensitivity Troponin:  No results for input(s): "TROPONINIHS" in the last 720 hours.   Chemistry Recent Labs  Lab 01/23/24 0530  NA 138  K 3.5  CL 95*  CO2 28  GLUCOSE 100*  BUN 39*  CREATININE 4.56*  CALCIUM 8.2*  MG 2.6*  ALBUMIN 2.7*  GFRNONAA 12*  ANIONGAP 15    Lipids No results for input(s): "CHOL", "TRIG", "HDL", "LABVLDL", "LDLCALC", "CHOLHDL" in the last 168 hours.  Hematology Recent Labs  Lab 01/23/24 0530  WBC 9.1  RBC 3.38*  HGB 10.0*  HCT 33.6*  MCV 99.4  MCH 29.6  MCHC 29.8*  RDW 20.6*  PLT 268   Thyroid No results for input(s): "TSH", "FREET4" in the last 168 hours.  BNPNo results for input(s): "BNP", "PROBNP" in the last 168 hours.  DDimer No results for input(s): "DDIMER" in the last 168 hours.   Radiology  DG Chest Port 1 View Result Date: 01/23/2024 CLINICAL DATA:  43 year old female with shortness of breath and hypoxia. EXAM: PORTABLE CHEST 1 VIEW COMPARISON:  Portable chest 01/15/2024, 12/22/2023. FINDINGS: Portable AP upright view at stable moderate to severe cardiomegaly, mediastinal contours. Continued lung base hypo ventilation worse on the left side. Since last month the pulmonary vascularity has mildly increased. No pneumothorax or consolidation. Trace fluid or thickening in the right minor fissure is unchanged. Overall ventilation has not significantly changed from 01/15/2024. Visualized tracheal air column is within normal limits. No acute osseous abnormality identified. Paucity of bowel gas. IMPRESSION: Cardiomegaly, small pleural effusions, and pulmonary edema not significantly changed since 01/15/2024. Electronically Signed   By: [name] M.D.   On: 01/23/2024 06:35    Cardiac Studies   TTE 1. Left ventricular ejection fraction, by estimation, is 30 to 35%. The left ventricle has moderately decreased function. The left  ventricle demonstrates global hypokinesis. The left ventricular internal cavity size was mildly dilated. Left ventricular diastolic parameters are indeterminate.   2. Right ventricular systolic function is moderately reduced. The right ventricular size is mildly enlarged. There is normal pulmonary artery systolic pressure. The estimated right ventricular systolic pressure is 35.0 mmHg.   3. Left atrial size was severely dilated.   4. Right atrial size was moderately dilated.   5. Thickened and calcified mitral chords, severely calcified leaflets.  The mitral valve is rheumatic. Moderate mitral valve regurgitation. Mild  mitral stenosis. The mean mitral valve gradient is 4.0 mmHg. Moderate  mitral annular calcification.   6. Tricuspid valve regurgitation is moderate to severe. Visually moderate  TR but hepatic vein systolic flow reversal suggests possible severe TR   7. The aortic valve is tricuspid. There is moderate calcification of the  aortic valve. Aortic valve regurgitation is moderate to severe, highly  eccentric. Aortic valve sclerosis/calcification is present, without any  evidence of aortic stenosis.   8. The inferior vena cava is dilated in size with >50% respiratory  variability, suggesting right atrial pressure of 8 mmHg.     CHA2DS2-VASc Score = 3   This indicates a 3.2% annual risk of stroke. The patient's score is based upon: CHF History: 1 HTN History: 1 Diabetes History: 0 Stroke History: 0 Vascular Disease History: 0 Age Score: 0 Gender Score: 1      Patient Profile     43 y.o. female with a hx of lupus nephritis, ESRD on HD (Tue/Thur/Sat), HFrEF, polysubstance use, known atrial fibrillation/flutter, valvular heart disease with mitral stenosis/mitral regurgitation, aortic insufficiency for evaluation of atrial flutter, acute on chronic respiratory failure with hypoxia who is being seen 01/23/2024 for the evaluation of atrial flutter at the request of Dr. Dorna Mai.    Assessment & Plan    Atrial flutter  History of atrial fibrillation Patient has known history of atrial fibrillation/flutter Reported Hrs in 150s per EMS Most recent HR 102 Continue amiodarone 200 mg daily Continue Toprol 12.5 mg daily  With history of subdural hematoma 11/2022 and recent fall, causing fracture of 4 ribs, unfortunately not the best candidate for anticoagulation at this time.  Acute on chronic combined systolic/diastolic heart failure NYHA Class II Most recent echo from 11/2023 shows: LVEF 30 to 35%, mild left ventricular enlargement, severe left atrial enlargement, moderate right atrial enlargement, mild right ventricular enlargement with moderate RV dysfunction, mild mitral stenosis, moderate mitral regurgitation, moderate to severe aortic insufficiency and moderate to severe tricuspid regurgitation GDMT limited by ESRD and BP Continue Toprol  12.5 mg daily  Volume removal per nephrology   Valvular heart disease  Noted mitral stenosis/mitral regurgitation, aortic insufficiency and tricuspid regurgitation on most recent echocardiogram  Patient will require serial echocardiograms in future to monitor progression Not likely to become a candidate for valve replacement    Per primary ESRD on HD History of substance abuse Anemia SLE with lupus nephritis  Hypercoagulability disorder     With transition to sinus rhythm I do not have any new recommendations for her. Cardiology will sign off and can plan to have her follow-up.   Time Spent Directly with Patient:  I have spent a total of 35 minutes with the patient reviewing hospital notes, telemetry, EKGs, labs and examining the patient as well as establishing an assessment and plan that was discussed personally with the patient.      For questions or updates, please contact [facility] Please consult [email or web address] for contact info under       Signed, Maisie Fus, MD  01/24/2024, 9:17 AM

## 2024-01-25 DIAGNOSIS — S71109S Unspecified open wound, unspecified thigh, sequela: Secondary | ICD-10-CM

## 2024-01-25 DIAGNOSIS — N186 End stage renal disease: Secondary | ICD-10-CM

## 2024-01-25 DIAGNOSIS — I5023 Acute on chronic systolic (congestive) heart failure: Secondary | ICD-10-CM | POA: Diagnosis not present

## 2024-01-25 DIAGNOSIS — F1910 Other psychoactive substance abuse, uncomplicated: Secondary | ICD-10-CM

## 2024-01-25 DIAGNOSIS — I483 Typical atrial flutter: Secondary | ICD-10-CM | POA: Diagnosis not present

## 2024-01-25 DIAGNOSIS — M3214 Glomerular disease in systemic lupus erythematosus: Secondary | ICD-10-CM | POA: Diagnosis not present

## 2024-01-25 LAB — BASIC METABOLIC PANEL
Anion gap: 14 (ref 5–15)
BUN: 44 mg/dL — ABNORMAL HIGH (ref 6–20)
CO2: 25 mmol/L (ref 22–32)
Calcium: 8.9 mg/dL (ref 8.9–10.3)
Chloride: 100 mmol/L (ref 98–111)
Creatinine, Ser: 4.55 mg/dL — ABNORMAL HIGH (ref 0.44–1.00)
GFR, Estimated: 12 mL/min — ABNORMAL LOW (ref >60)
Glucose, Bld: 79 mg/dL (ref 70–99)
Potassium: 4.2 mmol/L (ref 3.5–5.1)
Sodium: 139 mmol/L (ref 135–145)

## 2024-01-25 LAB — CBC
HCT: 31.7 % — ABNORMAL LOW (ref 36.0–46.0)
Hemoglobin: 9.7 g/dL — ABNORMAL LOW (ref 12.0–15.0)
MCH: 30.1 pg (ref 26.0–34.0)
MCHC: 30.6 g/dL (ref 30.0–36.0)
MCV: 98.4 fL (ref 80.0–100.0)
Platelets: 249 10*3/uL (ref 150–400)
RBC: 3.22 MIL/uL — ABNORMAL LOW (ref 3.87–5.11)
RDW: 21 % — ABNORMAL HIGH (ref 11.5–15.5)
WBC: 10.8 10*3/uL — ABNORMAL HIGH (ref 4.0–10.5)
nRBC: 0.7 % — ABNORMAL HIGH (ref 0.0–0.2)

## 2024-01-25 LAB — HEPATITIS B SURFACE ANTIBODY, QUANTITATIVE: Hep B S AB Quant (Post): 107 m[IU]/mL

## 2024-01-25 MED ORDER — DIPHENHYDRAMINE HCL 25 MG PO CAPS
25.0000 mg | ORAL_CAPSULE | Freq: Two times a day (BID) | ORAL | Status: DC | PRN
  Administered 2024-01-25 – 2024-01-30 (×10): 25 mg via ORAL
  Filled 2024-01-25 (×11): qty 1

## 2024-01-25 MED ORDER — WHITE PETROLATUM EX OINT
TOPICAL_OINTMENT | CUTANEOUS | Status: DC | PRN
  Administered 2024-01-25: 0.2 via TOPICAL
  Filled 2024-01-25: qty 28.35

## 2024-01-25 MED ORDER — ALBUMIN HUMAN 25 % IV SOLN
25.0000 g | Freq: Once | INTRAVENOUS | Status: AC
  Administered 2024-01-25: 25 g via INTRAVENOUS
  Filled 2024-01-25: qty 100

## 2024-01-25 NOTE — Progress Notes (Signed)
   01/25/24 1228  Vitals  BP (!) 105/58  Resp 17  Oxygen Therapy  SpO2 100 %  Post Treatment  Dialyzer Clearance Lightly streaked  Liters Processed 83.9  Fluid Removed (mL) 300 mL  Tolerated HD Treatment Yes  Post-Hemodialysis Comments tx tolerated with reduced UFG  AVG/AVF Arterial Site Held (minutes) 5 minutes  AVG/AVF Venous Site Held (minutes) 5 minutes

## 2024-01-25 NOTE — Progress Notes (Signed)
Nephrology Follow-Up Consult note   Assessment/Recommendations: [name] is a/an 43 y.o. female with a past medical history significant for ESRD, admitted for acute hypoxic respiratory failure.        [facility]   B400   45kg   2/2 bath  R AVF   Heparin none - last HD 1/25, post wt 46.2, gained 0.5kg during HD - hectorol 6 mcg tts - no esa, last hb 10.5     Assessment/ Plan: Acute hypoxic resp failure - w/ mild pulm edema on CXR. Suspect vol overload. However she is having issues w/ atrial fib/ RVR and soft BP's limiting UF on HD. Cont with midodrine 10mg  TID.  Continue with ultrafiltration as tolerated ESRD - on HD TTS.  Plan for dialysis today with ultrafiltration as tolerated BP - chronic hypotension on midodrine.  Anemia of esrd - Hb 9.7, not on esa at op unit. CTM and consider starting an extra treatment if she remains in the hospital Secondary hyperparathyroidism - CCa and phos acceptable. Cont binders w/ meals. Afib w/ RVR: per primary and cards  H/o SLE   Recommendations conveyed to primary service.    Darnell Level [facility] 01/25/2024 9:58 AM  ___________________________________________________________  CC: Shortness of breath  Interval History/Subjective: Patient feels well today with no complaints.  Tolerating dialysis   Medications:  Current Facility-Administered Medications  Medication Dose Route Frequency Provider Last Rate Last Admin   acetaminophen (TYLENOL) tablet 1,000 mg  1,000 mg Oral Q8H PRN Core, Doy Hutching, MD   1,000 mg at 01/24/24 1137   albuterol (PROVENTIL) (2.5 MG/3ML) 0.083% nebulizer solution 2.5 mg  2.5 mg Nebulization Q2H PRN Core, Doy Hutching, MD   2.5 mg at 01/25/24 0346   amiodarone (PACERONE) tablet 200 mg  200 mg Oral Daily Core, Doy Hutching, MD   200 mg at 01/24/24 1136   benzonatate (TESSALON) capsule 200 mg  200 mg Oral TID PRN Kathrynn Running, MD   200 mg at 01/24/24 2126   Chlorhexidine  Gluconate Cloth 2 % PADS 6 each  6 each Topical Q0600 Core, Doy Hutching, MD   6 each at 01/25/24 0610   cinacalcet (SENSIPAR) tablet 30 mg  30 mg Oral Q breakfast Core, Doy Hutching, MD   30 mg at 01/25/24 0720   diphenhydrAMINE (BENADRYL) capsule 25 mg  25 mg Oral BID PRN Lenny Pastel, PA-C   25 mg at 01/25/24 0850   heparin injection 5,000 Units  5,000 Units Subcutaneous Q8H Kathrynn Running, MD   5,000 Units at 01/25/24 1610   leptospermum manuka honey (MEDIHONEY) paste 1 Application  1 Application Topical Daily Core, Doy Hutching, MD   1 Application at 01/24/24 1100   melatonin tablet 3 mg  3 mg Oral QHS Core, Doy Hutching, MD   3 mg at 01/24/24 2126   midodrine (PROAMATINE) tablet 10 mg  10 mg Oral TID WC Core, Doy Hutching, MD   10 mg at 01/25/24 0720   midodrine (PROAMATINE) tablet 10 mg  10 mg Oral Once in dialysis Core, Doy Hutching, MD       multivitamin (RENA-VIT) tablet 1 tablet  1 tablet Oral QHS Core, Doy Hutching, MD   1 tablet at 01/24/24 2127   oxyCODONE (Oxy IR/ROXICODONE) immediate release tablet 5 mg  5 mg Oral Q4H PRN Howerter, Justin B, DO   5 mg at 01/25/24 0722   phenol (CHLORASEPTIC) mouth spray 1 spray  1 spray  Mouth/Throat PRN Kathrynn Running, MD   1 spray at 01/24/24 2126   sevelamer carbonate (RENVELA) tablet 2,400 mg  2,400 mg Oral TID WC Core, Doy Hutching, MD   2,400 mg at 01/25/24 0720   white petrolatum (VASELINE) gel   Topical PRN Kathrynn Running, MD   0.2 Application at 01/25/24 0346      Review of Systems: 10 systems reviewed and negative except per interval history/subjective  Physical Exam: Vitals:   01/25/24 0944 01/25/24 0954  BP: (!) 87/53 (!) 80/53  Pulse: (!) 106 67  Resp: 20 (!) 21  Temp:    SpO2: 99% 100%   No intake/output data recorded. No intake or output data in the 24 hours ending 01/25/24 0958  Constitutional: Chronically ill-appearing, sitting in bed, no distress ENMT: ears and nose without scars or lesions, MMM CV: normal rate, no edema Respiratory:  Bilateral chest rise with no increased work of breathing Gastrointestinal: soft, non-tender, no palpable masses or hernias Skin: no visible lesions or rashes Psych: alert, judgement/insight appropriate, appropriate mood and affect   Test Results I personally reviewed new and old clinical labs and radiology tests Lab Results  Component Value Date   NA 139 01/25/2024   K 4.2 01/25/2024   CL 100 01/25/2024   CO2 25 01/25/2024   BUN 44 (H) 01/25/2024   CREATININE 4.55 (H) 01/25/2024   CALCIUM 8.9 01/25/2024   ALBUMIN 2.7 (L) 01/23/2024   PHOS 4.5 01/23/2024    CBC Recent Labs  Lab 01/23/24 0530 01/24/24 1033 01/25/24 0748  WBC 9.1 6.9 10.8*  NEUTROABS 5.0  --   --   HGB 10.0* 9.0* 9.7*  HCT 33.6* 29.8* 31.7*  MCV 99.4 97.4 98.4  PLT 268 258 249

## 2024-01-25 NOTE — Progress Notes (Signed)
PT Cancellation Note  Patient Details Name: [name] MRN: [id number] DOB: Sep 29, 1981   Cancelled Treatment:    Reason Eval/Treat Not Completed: Patient at procedure or test/unavailable (HD). Will check back as time allows.   Lyanne Co, PT  Acute Rehab Services Secure chat preferred Office [phone]    Elyse Hsu 01/25/2024, 10:25 AM

## 2024-01-25 NOTE — Procedures (Signed)
I was present at this dialysis session. I have reviewed the session itself and made appropriate changes.   Filed Weights   01/24/24 0703 01/25/24 0345 01/25/24 0844  Weight: 46.9 kg 45.4 kg 48.3 kg    Recent Labs  Lab 01/23/24 0530 01/25/24 0306  NA 138 139  K 3.5 4.2  CL 95* 100  CO2 28 25  GLUCOSE 100* 79  BUN 39* 44*  CREATININE 4.56* 4.55*  CALCIUM 8.2* 8.9  PHOS 4.5  --     Recent Labs  Lab 01/23/24 0530 01/24/24 1033 01/25/24 0748  WBC 9.1 6.9 10.8*  NEUTROABS 5.0  --   --   HGB 10.0* 9.0* 9.7*  HCT 33.6* 29.8* 31.7*  MCV 99.4 97.4 98.4  PLT 268 258 249    Scheduled Meds:  amiodarone  200 mg Oral Daily   Chlorhexidine Gluconate Cloth  6 each Topical Q0600   cinacalcet  30 mg Oral Q breakfast   heparin  5,000 Units Subcutaneous Q8H   leptospermum manuka honey  1 Application Topical Daily   melatonin  3 mg Oral QHS   midodrine  10 mg Oral TID WC   midodrine  10 mg Oral Once in dialysis   multivitamin  1 tablet Oral QHS   sevelamer carbonate  2,400 mg Oral TID WC   Continuous Infusions: PRN Meds:.acetaminophen, albuterol, benzonatate, diphenhydrAMINE, oxyCODONE, phenol, white petrolatum   Louie Bun,  MD 01/25/2024, 9:59 AM

## 2024-01-25 NOTE — Evaluation (Signed)
Physical Therapy Evaluation Patient Details Name: [name] MRN: [id number] DOB: 1981-01-19 Today's Date: 01/25/2024  History of Present Illness  Pt is a 43 y.o. female who presented 01/23/24 with progressively worsening shortness of breath. Pt with open thigh wound. Recent admission at [facility] for acute respiratory failure and volume overload.  Fell at [facility] 12/25/24 and sustained R rib fxs, has been on O2 since then at [facility]. PMH: anemia, arthritis, CHF, ESRD on HD, heart murmur, lupus, polysubstance abuse, seizures, chronic R SDH  Clinical Impression  Pt admitted with above diagnosis. Pt received OOB with nursing present having been to bathroom. Pt without O2 on and SPO2 low 80's. Pt relays that she is not happy with the care at [facility] and if she has to go back to SNF she would prefer to switch. Pt had recent fall at SNF with broken ribs. She denies using AD but has dizziness when up and balance impairment. Needs min A for safety. SPO2 in 90's on 4L O2 and pt less dizzy. Patient will benefit from continued inpatient follow up therapy, <3 hours/day.  Pt currently with functional limitations due to the deficits listed below (see PT Problem List). Pt will benefit from acute skilled PT to increase their independence and safety with mobility to allow discharge.           If plan is discharge home, recommend the following: A little help with walking and/or transfers;A little help with bathing/dressing/bathroom;Assist for transportation   Can travel by private vehicle   Yes    Equipment Recommendations None recommended by PT  Recommendations for Other Services       Functional Status Assessment Patient has had a recent decline in their functional status and demonstrates the ability to make significant improvements in function in a reasonable and predictable amount of time.     Precautions / Restrictions Precautions Precautions: Fall Precaution Comments: pt  impulsive Restrictions Weight Bearing Restrictions Per Provider Order: No      Mobility  Bed Mobility Overal bed mobility: Modified Independent             General bed mobility comments: pt able to get in and out of bed without assist    Transfers Overall transfer level: Needs assistance Equipment used: None Transfers: Sit to/from Stand Sit to Stand: Contact guard assist           General transfer comment: CGA from small chair in room and from bed. Pt notes dizziness when up    Ambulation/Gait Ambulation/Gait assistance: Min assist Gait Distance (Feet): 20 Feet Assistive device: 1 person hand held assist Gait Pattern/deviations: Step-through pattern, Drifts right/left Gait velocity: decreased Gait velocity interpretation: <1.31 ft/sec, indicative of household ambulator   General Gait Details: pt with dizziness throughout time up. No LOB but reliant on min HHA for support  Stairs            Wheelchair Mobility     Tilt Bed    Modified Rankin (Stroke Patients Only)       Balance Overall balance assessment: Needs assistance, History of Falls Sitting-balance support: No upper extremity supported, Feet supported Sitting balance-Leahy Scale: Good     Standing balance support: No upper extremity supported, During functional activity Standing balance-Leahy Scale: Fair Standing balance comment: worsens with fatigue and decreased O2 sats                             Pertinent  Vitals/Pain Pain Assessment Pain Assessment: No/denies pain    Home Living Family/patient expects to be discharged to:: Skilled nursing facility                   Additional Comments: has been at [facility] but really does not want to go back there. Hoping to live on her own in an apt but was with mother in second level apt before SNF.    Prior Function Prior Level of Function : Independent/Modified Independent             Mobility Comments:  reports she has been independent at SNF without AD but did have a fall. Reports she has a w/c at SNF she can push or sit in ADLs Comments: reports independence     Extremity/Trunk Assessment   Upper Extremity Assessment Upper Extremity Assessment: Overall WFL for tasks assessed    Lower Extremity Assessment Lower Extremity Assessment: RLE deficits/detail;Generalized weakness RLE Deficits / Details: R open thigh wound covered with a dressing. Pt reports she is frustrated that no fluid was taken off at HD but pt without LE swelling RLE Sensation: WNL RLE Coordination: decreased gross motor    Cervical / Trunk Assessment Cervical / Trunk Assessment: Normal  Communication   Communication Communication: No apparent difficulties Cueing Techniques: Verbal cues  Cognition Arousal: Alert Behavior During Therapy: Impulsive Overall Cognitive Status: No family/caregiver present to determine baseline cognitive functioning                                 General Comments: health literacy likely low. AxOx4        General Comments General comments (skin integrity, edema, etc.): SPO2 dropped into low 80's with ambulation on 4L O2. Pt trying to eat lunch without O2, cued to replace it    Exercises     Assessment/Plan    PT Assessment Patient needs continued PT services  PT Problem List Decreased strength;Decreased activity tolerance;Decreased balance;Decreased mobility;Decreased cognition;Decreased knowledge of precautions;Decreased safety awareness;Decreased knowledge of use of DME;Cardiopulmonary status limiting activity       PT Treatment Interventions DME instruction;Gait training;Functional mobility training;Therapeutic activities;Therapeutic exercise;Balance training;Patient/family education;Cognitive remediation;Neuromuscular re-education    PT Goals (Current goals can be found in the Care Plan section)  Acute Rehab PT Goals Patient Stated Goal: not go to [facility] PT Goal Formulation: With patient Time For Goal Achievement: 02/08/24 Potential to Achieve Goals: Fair    Frequency Min 1X/week     [name]-evaluation               AM-PAC PT "6 Clicks" Mobility  Outcome Measure Help needed turning from your back to your side while in a flat bed without using bedrails?: None Help needed moving from lying on your back to sitting on the side of a flat bed without using bedrails?: None Help needed moving to and from a bed to a chair (including a wheelchair)?: A Little Help needed standing up from a chair using your arms (e.g., wheelchair or bedside chair)?: A Little Help needed to walk in hospital room?: A Little Help needed climbing 3-5 steps with a railing? : Total 6 Click Score: 18    End of Session Equipment Utilized During Treatment: Gait belt;Oxygen Activity Tolerance: Patient limited by fatigue Patient left: in bed;with call bell/phone within reach Nurse Communication: Mobility status PT Visit Diagnosis: Unsteadiness on feet (R26.81);History of falling (Z91.81);Difficulty in walking, not elsewhere classified (  R26.2)    Time: 9629-5284 PT Time Calculation (min) (ACUTE ONLY): 22 min   Charges:   PT Evaluation $PT Eval Moderate Complexity: 1 Mod   PT General Charges $$ ACUTE PT VISIT: 1 Visit         [name], PT  Acute Rehab Services Secure chat preferred Office [phone]   [name] 01/25/2024, 4:29 PM

## 2024-01-25 NOTE — Progress Notes (Signed)
Received patient in bed to unit.  Alert and oriented.  Informed consent signed and in chart.   TX duration:3.5hrs  Patient tolerated well.  Transported back to the room  Alert, without acute distress.  Hand-off given to patient's nurse.   Access used: AVF Access issues: n/a  Total UF removed: 300cc Medication(s) given: benadryl, albumin   01/25/24 1220  Vitals  Temp 98.1 F (36.7 C)  Temp Source Oral  BP (!) 95/59  BP Location Left Arm  BP Method Automatic  Patient Position (if appropriate) Lying  Pulse Rate 98  Pulse Rate Source Monitor  Resp 15  Oxygen Therapy  SpO2 100 %  O2 Device Nasal Cannula  During Treatment Monitoring  Blood Flow Rate (mL/min) 399 mL/min  Arterial Pressure (mmHg) -169.69 mmHg  Venous Pressure (mmHg) 190.9 mmHg  TMP (mmHg) -1.01 mmHg  Ultrafiltration Rate (mL/min) 0 mL/min  Dialysate Flow Rate (mL/min) 300 ml/min  Duration of HD Treatment -hour(s) 3.46 hour(s)  Cumulative Fluid Removed (mL) per Treatment  251.98  HD Safety Checks Performed Yes  Intra-Hemodialysis Comments Tx completed      Freddi Starr, RN Kidney Dialysis Unit

## 2024-01-25 NOTE — Plan of Care (Signed)

## 2024-01-25 NOTE — Progress Notes (Signed)
Progress Note   Patient: [name] NFA:[id number] DOB: [birth year] DOA: 01/23/2024     2 DOS: the patient was seen and examined on 01/25/2024   Brief hospital course: Mrs. Fronek was admitted to the hospital with the working diagnosis of volume overload in the setting of ESRD on HD.   43 yo female with the past medical history of ESRD on HD, heart failure, SLE.  Recent admission to [facility] 12/2023 for volume overload, atrial flutter and influenza A, she was discharged to SNF. Apparently ultrafiltration was limited due to hypotension and atrial flutter.  At the SNF she was worsening dyspnea. On the day of admission she had acute and severe PND, EMS was called and she was found with 02 saturation 73% on 3 L.min per [place] with HR 150 bpm. She was placed on supplemental 0-2 and was transported to the ED.  At the time of her admission her blood pressure was 112/61, HR 106, RR 24 and 02 saturation 100% on supplemental 02.  Lungs with rales bilaterally, heart with S1 and S2 present, irregularly irregular with no gallops or rubs, abdomen with no distention and trace lower extremity edema.   Nephrology was consulted and patient had emergent HD.   01/28 HD today, not able to do much ultrafiltration due to hypotension.   Assessment and Plan: * ESRD (end stage renal disease) (HCC) Patient volume overloaded, difficult for ultrafiltration due to hypotension.   Continue blood pressure support with midodrine.  Follow up nephrology recommendations for further renal replacement therapy, ultrafiltration as tolerated.   Acute hypoxemic respiratory failure due to volume overload, acute pulmonary edema.  02 saturation today is 94% on room air.   Metabolic bone disease Sensipar 30 mg and Renvela 2400 mg   Anemia of chronic renal disease with stable cell count.   Atrial flutter (HCC) Continue rate control with amiodarone.  Not on anticoagulation due to risk of bleeding, recent falls and history of  subdural bleed in the past.  Continue telemetry monitoring.   Acute on chronic systolic CHF (congestive heart failure) (HCC) Echocardiogram with reduced LV systolic function EF 30 to 35% with global hypokinesis, RV systolic function with moderate reduction, RVSP 35, LA with severe dilatation, RA with moderate dilatation, mild mitral stenosis, mild mitral regurgitation (rheumatic), moderate to severe TR, aortic valve with moderate to severe regurgitation.   Continue ultrafiltration as tolerated.  Considering biventricular failure and valvular disease her prognosis is very poor. High risk for hypotension during ultrafiltration,   Lupus (systemic lupus erythematosus) (HCC) Lupus History of lupus nephritis does not seem to have any acute episode at this time  Open thigh wound Right thigh wound Present on admission along with patient reported buttock and also left pinky wound.   Substance abuse (HCC) Patient denies any cocaine use since last year       Subjective: Patient continue to have dyspnea post HD, no chest pain, no nausea or vomiting.   Physical Exam: Vitals:   01/25/24 1159 01/25/24 1220 01/25/24 1228 01/25/24 1712  BP: (!) 102/56 (!) 95/59 (!) 105/58 (!) 104/59  Pulse: (!) 102 98  (!) 119  Resp: 20 15 17 17   Temp:  98.1 F (36.7 C)  98.5 F (36.9 C)  TempSrc:  Oral  Oral  SpO2: 100% 100% 100% 97%  Weight:   44.9 kg   Height:       Neurology awake and alert, deconditioned and ill looking appearing ENT with mild pallor  Cardiovascular S1 and  S2 present and regular with no gallops, no rubs, positive systolic murmur at the apex Respiratory with bilateral rhonchi and rales with no wheezing  Abdomen with no distention  No lower extremity edema,  Data Reviewed:    Family Communication: no family at the bedside   Disposition: Status is: Inpatient renal replacement therapy    Planned Discharge Destination: Home      Author: Coralie Keens,  MD 01/25/2024 6:09 PM  For on call review [email or web address].

## 2024-01-25 NOTE — Hospital Course (Signed)
Mrs. Neyens was admitted to the hospital with the working diagnosis of volume overload in the setting of ESRD on HD.   43 yo female with the past medical history of ESRD on HD, heart failure, SLE.  Recent admission to [facility] 12/2023 for volume overload, atrial flutter and influenza A, she was discharged to SNF. Apparently ultrafiltration was limited due to hypotension and atrial flutter.  At the SNF she was worsening dyspnea. On the day of admission she had acute and severe PND, EMS was called and she was found with 02 saturation 73% on 3 L.min per [place] with HR 150 bpm. She was placed on supplemental 0-2 and was transported to the ED.  At the time of her admission her blood pressure was 112/61, HR 106, RR 24 and 02 saturation 100% on supplemental 02.  Lungs with rales bilaterally, heart with S1 and S2 present, irregularly irregular with no gallops or rubs, abdomen with no distention and trace lower extremity edema.   Nephrology was consulted and patient had emergent HD.   01/28 HD today, not able to do much ultrafiltration due to hypotension.  01/29 HD today for possible ultrafiltration, her blood pressure continue to be low.

## 2024-01-25 NOTE — Progress Notes (Signed)
Patient A/Ox4 and stable at time of transport to dialysis.

## 2024-01-26 DIAGNOSIS — I5023 Acute on chronic systolic (congestive) heart failure: Secondary | ICD-10-CM | POA: Diagnosis not present

## 2024-01-26 DIAGNOSIS — M3214 Glomerular disease in systemic lupus erythematosus: Secondary | ICD-10-CM | POA: Diagnosis not present

## 2024-01-26 DIAGNOSIS — N186 End stage renal disease: Secondary | ICD-10-CM | POA: Diagnosis not present

## 2024-01-26 DIAGNOSIS — I483 Typical atrial flutter: Secondary | ICD-10-CM | POA: Diagnosis not present

## 2024-01-26 LAB — RENAL FUNCTION PANEL
Albumin: 3 g/dL — ABNORMAL LOW (ref 3.5–5.0)
Anion gap: 17 — ABNORMAL HIGH (ref 5–15)
BUN: 34 mg/dL — ABNORMAL HIGH (ref 6–20)
CO2: 25 mmol/L (ref 22–32)
Calcium: 8.8 mg/dL — ABNORMAL LOW (ref 8.9–10.3)
Chloride: 93 mmol/L — ABNORMAL LOW (ref 98–111)
Creatinine, Ser: 3.72 mg/dL — ABNORMAL HIGH (ref 0.44–1.00)
GFR, Estimated: 15 mL/min — ABNORMAL LOW (ref >60)
Glucose, Bld: 67 mg/dL — ABNORMAL LOW (ref 70–99)
Phosphorus: 4.4 mg/dL (ref 2.5–4.6)
Potassium: 4.4 mmol/L (ref 3.5–5.1)
Sodium: 135 mmol/L (ref 135–145)

## 2024-01-26 NOTE — Progress Notes (Signed)
Received patient in bed.Awake,alert and oriented x 4.She signed her new treatment's consent.  Access used: Right arm AVF that worked well.  Duration of treatment 3.5 hours.   Net UF. Met 3.3 liters  Tolerated treatment : Yes.  Medicine given : Scheduled Midodrine 10 mg.  Hand off to the patient's nurse.: Back into her room with stable medical condition via transporter.

## 2024-01-26 NOTE — Progress Notes (Signed)
Pt receives out-pt HD at [facility] [place] on TTS. Will assist as needed.   Olivia Canter Renal Navigator [phone]

## 2024-01-26 NOTE — Progress Notes (Addendum)
Progress Note   Patient: [name] LKG:[id number] DOB: August 05, 1981 DOA: 01/23/2024     3 DOS: the patient was seen and examined on 01/26/2024   Brief hospital course: [name] was admitted to the hospital with the working diagnosis of volume overload in the setting of ESRD on HD.   43 yo female with the past medical history of ESRD on HD, heart failure, SLE.  Recent admission to [facility] 12/2023 for volume overload, atrial flutter and influenza A, she was discharged to SNF. Apparently ultrafiltration was limited due to hypotension and atrial flutter.  At the SNF she was worsening dyspnea. On the day of admission she had acute and severe PND, EMS was called and she was found with 02 saturation 73% on 3 L.min per [place] with HR 150 bpm. She was placed on supplemental 0-2 and was transported to the ED.  At the time of her admission her blood pressure was 112/61, HR 106, RR 24 and 02 saturation 100% on supplemental 02.  Lungs with rales bilaterally, heart with S1 and S2 present, irregularly irregular with no gallops or rubs, abdomen with no distention and trace lower extremity edema.   Nephrology was consulted and patient had emergent HD.   01/28 HD today, not able to do much ultrafiltration due to hypotension.  01/29 HD today for possible ultrafiltration, her blood pressure continue to be low.   Assessment and Plan: * ESRD (end stage renal disease) (HCC) Patient volume overloaded, difficult for ultrafiltration due to hypotension.   BUN today down to 34 from 44.   Continue blood pressure support with midodrine.  Today for further renal replacement therapy with  ultrafiltration as tolerated.   Acute hypoxemic respiratory failure due to volume overload, acute pulmonary edema.  02 saturation today is 98% on 3 L/min per [place]   Metabolic bone disease Sensipar 30 mg and Renvela 2400 mg   Anemia of chronic renal disease with stable cell count.   Consult nutrition for assessment  Atrial  flutter (HCC) Continue rate control with amiodarone.  Not on anticoagulation due to risk of bleeding, recent falls and history of subdural bleed in the past.  Continue telemetry monitoring.   Acute on chronic systolic CHF (congestive heart failure) (HCC) Echocardiogram with reduced LV systolic function EF 30 to 35% with global hypokinesis, RV systolic function with moderate reduction, RVSP 35, LA with severe dilatation, RA with moderate dilatation, mild mitral stenosis, mild mitral regurgitation (rheumatic), moderate to severe TR, aortic valve with moderate to severe regurgitation.   Continue ultrafiltration as tolerated.  Considering biventricular failure and valvular disease her prognosis is very poor. High risk for hypotension during ultrafiltration,   Lupus (systemic lupus erythematosus) (HCC) Lupus History of lupus nephritis does not seem to have any acute episode at this time  Open thigh wound Right thigh wound  Present on admission along with patient reported buttock and also left pinky wound.    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    Substance abuse (HCC) Patient denies any cocaine use since last year        Subjective: Patient continue very weak and deconditioned, continue to have dyspnea, no chest pain or palpitations   Physical Exam: Vitals:   01/26/24 1222 01/26/24 1432 01/26/24 1533 01/26/24 1545  BP: (!) 92/51 (!) 95/51 (!) 89/48 (!) 95/55  Pulse: (!) 55 (!) 106    Resp: 20 20    Temp: (!) 97.4 F (36.3 C) 97.9 F (36.6 C) 98.5  F (36.9 C)   TempSrc: Oral Oral Oral   SpO2: 94% 93% 98%   Weight:      Height:       Neurology awake and alert ENT with mild pallor Cardiovascular with S1 and S2 present and regular, tachycardic with loud S2, positive systolic murmur a the apex Moderate JVD No lower extremity edema Respiratory with scattered rales and rhonchi with no wheezing Abdomen with no distention  No lower extremity edema  Data  Reviewed:    Family Communication: no family at the bedside   Disposition: Status is: Inpatient Remains inpatient appropriate because: inpatient renal replacement therapy   Planned Discharge Destination: Home     Author: Coralie Keens, MD 01/26/2024 4:03 PM  For on call review [email or web address].

## 2024-01-26 NOTE — Progress Notes (Signed)
Nephrology Follow-Up Consult note   Assessment/Recommendations: [name] is a/an 43 y.o. female with a past medical history significant for ESRD, admitted for acute hypoxic respiratory failure.        OP HD: [facility] 3h   B400   45kg   2/2 bath  R AVF   Heparin none - last HD 1/25, post wt 46.2, gained 0.5kg during HD - hectorol 6 mcg tts - no esa, last hb 10.5     Assessment/ Plan: Acute hypoxic resp failure - w/ mild pulm edema on CXR. Suspect vol overload. However she is having issues w/ atrial fib/ RVR and soft BP's limiting UF on HD. Cont with midodrine 10mg  TID.  Continue with ultrafiltration as tolerated ESRD - on HD TTS.  Plan for dialysis again today with dry ultrafiltration initially.  More aggressive fluid removal and allowance of low blood pressures. BP - chronic hypotension on midodrine.  Anemia of esrd - Hb 9.7, not on esa at op unit. CTM and consider starting an extra treatment if she remains in the hospital Secondary hyperparathyroidism - CCa and phos acceptable. Cont binders w/ meals. Afib w/ RVR: per primary and cards  H/o SLE   Recommendations conveyed to primary service.    Darnell Level [facility] 01/26/2024 10:23 AM  ___________________________________________________________  CC: Shortness of breath  Interval History/Subjective: Worsening shortness of breath today.  Some tachycardia.  Essentially did not get any fluid off on dialysis because of concerns with low blood pressure.   Medications:  Current Facility-Administered Medications  Medication Dose Route Frequency Provider Last Rate Last Admin   acetaminophen (TYLENOL) tablet 1,000 mg  1,000 mg Oral Q8H PRN Core, Doy Hutching, MD   1,000 mg at 01/24/24 1137   albuterol (PROVENTIL) (2.5 MG/3ML) 0.083% nebulizer solution 2.5 mg  2.5 mg Nebulization Q2H PRN Core, Doy Hutching, MD   2.5 mg at 01/25/24 2343   amiodarone (PACERONE) tablet 200 mg  200 mg Oral Daily Core, Doy Hutching,  MD   200 mg at 01/26/24 1610   benzonatate (TESSALON) capsule 200 mg  200 mg Oral TID PRN Kathrynn Running, MD   200 mg at 01/24/24 2126   Chlorhexidine Gluconate Cloth 2 % PADS 6 each  6 each Topical Q0600 Core, Doy Hutching, MD   6 each at 01/25/24 0610   cinacalcet (SENSIPAR) tablet 30 mg  30 mg Oral Q breakfast Core, Doy Hutching, MD   30 mg at 01/26/24 9604   diphenhydrAMINE (BENADRYL) capsule 25 mg  25 mg Oral BID PRN Lenny Pastel, PA-C   25 mg at 01/26/24 0354   heparin injection 5,000 Units  5,000 Units Subcutaneous Q8H Wouk, Wilfred Curtis, MD   5,000 Units at 01/25/24 2301   leptospermum manuka honey (MEDIHONEY) paste 1 Application  1 Application Topical Daily Core, Doy Hutching, MD   1 Application at 01/25/24 1310   melatonin tablet 3 mg  3 mg Oral QHS Core, Doy Hutching, MD   3 mg at 01/24/24 2126   midodrine (PROAMATINE) tablet 10 mg  10 mg Oral TID WC Core, Doy Hutching, MD   10 mg at 01/26/24 5409   multivitamin (RENA-VIT) tablet 1 tablet  1 tablet Oral QHS Core, Doy Hutching, MD   1 tablet at 01/25/24 2301   oxyCODONE (Oxy IR/ROXICODONE) immediate release tablet 5 mg  5 mg Oral Q4H PRN Howerter, Justin B, DO   5 mg at 01/26/24 0844   phenol (CHLORASEPTIC) mouth spray  1 spray  1 spray Mouth/Throat PRN Kathrynn Running, MD   1 spray at 01/24/24 2126   sevelamer carbonate (RENVELA) tablet 2,400 mg  2,400 mg Oral TID WC Core, Doy Hutching, MD   2,400 mg at 01/26/24 1610   white petrolatum (VASELINE) gel   Topical PRN Kathrynn Running, MD   0.2 Application at 01/25/24 0346      Review of Systems: 10 systems reviewed and negative except per interval history/subjective  Physical Exam: Vitals:   01/26/24 0400 01/26/24 0814  BP: (!) 112/58 (!) 102/56  Pulse:  (!) 110  Resp: 20 16  Temp: 98.9 F (37.2 C) 97.9 F (36.6 C)  SpO2: 97% 97%   No intake/output data recorded.  Intake/Output Summary (Last 24 hours) at 01/26/2024 1023 Last data filed at 01/25/2024 1800 Gross per 24 hour  Intake 360 ml   Output 300 ml  Net 60 ml    Constitutional: Chronically ill-appearing, sitting in bed, no distress ENMT: ears and nose without scars or lesions, MMM CV: normal rate, trace edema Respiratory: Bilateral chest rise with mild creased work of breathing Gastrointestinal: soft, non-tender, no palpable masses or hernias Skin: no visible lesions or rashes Psych: alert, judgement/insight appropriate, appropriate mood and affect   Test Results I personally reviewed new and old clinical labs and radiology tests Lab Results  Component Value Date   NA 135 01/26/2024   K 4.4 01/26/2024   CL 93 (L) 01/26/2024   CO2 25 01/26/2024   BUN 34 (H) 01/26/2024   CREATININE 3.72 (H) 01/26/2024   CALCIUM 8.8 (L) 01/26/2024   ALBUMIN 3.0 (L) 01/26/2024   PHOS 4.4 01/26/2024    CBC Recent Labs  Lab 01/23/24 0530 01/24/24 1033 01/25/24 0748  WBC 9.1 6.9 10.8*  NEUTROABS 5.0  --   --   HGB 10.0* 9.0* 9.7*  HCT 33.6* 29.8* 31.7*  MCV 99.4 97.4 98.4  PLT 268 258 249

## 2024-01-26 NOTE — Plan of Care (Signed)
?  Problem: Clinical Measurements: ?Goal: Will remain free from infection ?Outcome: Progressing ?  ?Problem: Clinical Measurements: ?Goal: Diagnostic test results will improve ?Outcome: Progressing ?  ?Problem: Nutrition: ?Goal: Adequate nutrition will be maintained ?Outcome: Progressing ?  ?Problem: Coping: ?Goal: Level of anxiety will decrease ?Outcome: Progressing ?  ?

## 2024-01-26 NOTE — Progress Notes (Signed)
Notified nurse on VS

## 2024-01-26 NOTE — Progress Notes (Signed)
Informed to the MD via secure chat about pt's BP, no any new orders  1508 transport staff took her for HD, report given to the RN

## 2024-01-27 DIAGNOSIS — J9621 Acute and chronic respiratory failure with hypoxia: Secondary | ICD-10-CM

## 2024-01-27 DIAGNOSIS — D631 Anemia in chronic kidney disease: Secondary | ICD-10-CM

## 2024-01-27 DIAGNOSIS — N189 Chronic kidney disease, unspecified: Secondary | ICD-10-CM

## 2024-01-27 DIAGNOSIS — S71101A Unspecified open wound, right thigh, initial encounter: Secondary | ICD-10-CM | POA: Diagnosis not present

## 2024-01-27 DIAGNOSIS — N186 End stage renal disease: Secondary | ICD-10-CM | POA: Diagnosis not present

## 2024-01-27 DIAGNOSIS — Z992 Dependence on renal dialysis: Secondary | ICD-10-CM

## 2024-01-27 DIAGNOSIS — J810 Acute pulmonary edema: Secondary | ICD-10-CM

## 2024-01-27 DIAGNOSIS — I4891 Unspecified atrial fibrillation: Secondary | ICD-10-CM

## 2024-01-27 LAB — CBC
HCT: 34.1 % — ABNORMAL LOW (ref 36.0–46.0)
Hemoglobin: 10.1 g/dL — ABNORMAL LOW (ref 12.0–15.0)
MCH: 30.2 pg (ref 26.0–34.0)
MCHC: 29.6 g/dL — ABNORMAL LOW (ref 30.0–36.0)
MCV: 102.1 fL — ABNORMAL HIGH (ref 80.0–100.0)
Platelets: 212 10*3/uL (ref 150–400)
RBC: 3.34 MIL/uL — ABNORMAL LOW (ref 3.87–5.11)
RDW: 21 % — ABNORMAL HIGH (ref 11.5–15.5)
WBC: 7.3 10*3/uL (ref 4.0–10.5)
nRBC: 0.5 % — ABNORMAL HIGH (ref 0.0–0.2)

## 2024-01-27 LAB — RENAL FUNCTION PANEL
Albumin: 2.8 g/dL — ABNORMAL LOW (ref 3.5–5.0)
Anion gap: 15 (ref 5–15)
BUN: 37 mg/dL — ABNORMAL HIGH (ref 6–20)
CO2: 25 mmol/L (ref 22–32)
Calcium: 8.7 mg/dL — ABNORMAL LOW (ref 8.9–10.3)
Chloride: 95 mmol/L — ABNORMAL LOW (ref 98–111)
Creatinine, Ser: 3.78 mg/dL — ABNORMAL HIGH (ref 0.44–1.00)
GFR, Estimated: 15 mL/min — ABNORMAL LOW (ref >60)
Glucose, Bld: 94 mg/dL (ref 70–99)
Phosphorus: 4.3 mg/dL (ref 2.5–4.6)
Potassium: 4.6 mmol/L (ref 3.5–5.1)
Sodium: 135 mmol/L (ref 135–145)

## 2024-01-27 MED ORDER — ALBUMIN HUMAN 25 % IV SOLN
25.0000 g | Freq: Once | INTRAVENOUS | Status: AC
  Administered 2024-01-27: 25 g via INTRAVENOUS

## 2024-01-27 MED ORDER — NEPRO/CARBSTEADY PO LIQD
237.0000 mL | Freq: Two times a day (BID) | ORAL | Status: DC
Start: 2024-01-27 — End: 2024-01-30
  Administered 2024-01-28 – 2024-01-30 (×4): 237 mL via ORAL
  Filled 2024-01-27 (×3): qty 237

## 2024-01-27 NOTE — Evaluation (Signed)
Occupational Therapy Evaluation Patient Details Name: [name] MRN: [id number] DOB: 02-20-1981 Today's Date: 01/27/2024   History of Present Illness Pt is a 43 y.o. female who presented 01/23/24 with progressively worsening shortness of breath. Pt with open thigh wound. Recent admission at [facility] for acute respiratory failure and volume overload.  Fell at [facility] 12/25/24 and sustained R rib fxs, has been on O2 since then at [facility]. PMH: anemia, arthritis, CHF, ESRD on HD, heart murmur, lupus, polysubstance abuse, seizures, chronic R SDH   Clinical Impression   PTA, pt lives with mother and typically Independent with ADLs, IADLs and mobility without AD. Pt discharged to SNF rehab prior to this admission and reports managing ADLs on her own though did have a fall. Pt presents now with minor deficits in cognition, endurance, strength and dynamic standing balance. Pt able to mobilize in hallway without AD with CGA and manage ADLs with no more than CGA. Pt does endorse dizziness throughout activity, BP WFL. Pt interested in a different inpatient rehab facility but if family able to provide consistent support, pt with potential to DC home.       If plan is discharge home, recommend the following: Assistance with cooking/housework;Direct supervision/assist for medications management;Direct supervision/assist for financial management;Assist for transportation    Functional Status Assessment  Patient has had a recent decline in their functional status and demonstrates the ability to make significant improvements in function in a reasonable and predictable amount of time.  Equipment Recommendations  None recommended by OT    Recommendations for Other Services       Precautions / Restrictions Precautions Precautions: Fall Restrictions Weight Bearing Restrictions Per Provider Order: No      Mobility Bed Mobility Overal bed mobility: Modified Independent                   Transfers Overall transfer level: Needs assistance Equipment used: None Transfers: Sit to/from Stand Sit to Stand: Supervision                  Balance Overall balance assessment: Needs assistance, History of Falls Sitting-balance support: No upper extremity supported, Feet supported Sitting balance-Leahy Scale: Good     Standing balance support: No upper extremity supported, During functional activity Standing balance-Leahy Scale: Fair Standing balance comment: Fair+                           ADL either performed or assessed with clinical judgement   ADL Overall ADL's : Needs assistance/impaired Eating/Feeding: Independent   Grooming: Supervision/safety;Standing   Upper Body Bathing: Set up;Sitting   Lower Body Bathing: Contact guard assist;Sit to/from stand;Sitting/lateral leans   Upper Body Dressing : Set up;Sitting Upper Body Dressing Details (indicate cue type and reason): donning gown and then doffing at end of session Lower Body Dressing: Contact guard assist;Sitting/lateral leans;Sit to/from stand Lower Body Dressing Details (indicate cue type and reason): able to don socks sitting EOB with setup. CGA for standing tasks Toilet Transfer: Contact guard assist;Ambulation   Toileting- Clothing Manipulation and Hygiene: Contact guard assist;Sitting/lateral lean;Sit to/from stand       Functional mobility during ADLs: Contact guard assist General ADL Comments: able to mobilize in hall without AD, did report dizziness throughout activity but BP WFL     Vision Ability to See in Adequate Light: 0 Adequate Patient Visual Report: No change from baseline Vision Assessment?: No apparent visual deficits     Perception  Praxis         Pertinent Vitals/Pain Pain Assessment Pain Assessment: No/denies pain     Extremity/Trunk Assessment Upper Extremity Assessment Upper Extremity Assessment: Generalized weakness;Right hand dominant    Lower Extremity Assessment Lower Extremity Assessment: Defer to PT evaluation   Cervical / Trunk Assessment Cervical / Trunk Assessment: Normal   Communication Communication Communication: No apparent difficulties Cueing Techniques: Verbal cues   Cognition Arousal: Alert Behavior During Therapy: WFL for tasks assessed/performed, Flat affect Overall Cognitive Status: No family/caregiver present to determine baseline cognitive functioning                                 General Comments: health literacy likely low. AxOx4. follows directions consistently     General Comments       Exercises     Shoulder Instructions      Home Living Family/patient expects to be discharged to:: Skilled nursing facility                                 Additional Comments: has been at [facility] but really does not want to go back there. Hoping to live on her own in an apt but was with mother in second level apt with flight to enter before SNF.      Prior Functioning/Environment Prior Level of Function : Independent/Modified Independent             Mobility Comments: reports she has been independent at SNF but did have a fall. Reports she has a w/c at SNF she can push or sit in ADLs Comments: reports independence        OT Problem List: Decreased strength;Decreased activity tolerance;Impaired balance (sitting and/or standing);Decreased cognition;Decreased safety awareness;Cardiopulmonary status limiting activity      OT Treatment/Interventions: Self-care/ADL training;Therapeutic exercise;Energy conservation;DME and/or AE instruction;Therapeutic activities    OT Goals(Current goals can be found in the care plan section) Acute Rehab OT Goals Patient Stated Goal: go to different rehab place OT Goal Formulation: With patient Time For Goal Achievement: 02/10/24 Potential to Achieve Goals: Good  OT Frequency: Min 1X/week    Co-evaluation               AM-PAC OT "6 Clicks" Daily Activity     Outcome Measure Help from another person eating meals?: None Help from another person taking care of personal grooming?: A Little Help from another person toileting, which includes using toliet, bedpan, or urinal?: A Little Help from another person bathing (including washing, rinsing, drying)?: A Little Help from another person to put on and taking off regular upper body clothing?: A Little Help from another person to put on and taking off regular lower body clothing?: A Little 6 Click Score: 19   End of Session Equipment Utilized During Treatment: Gait belt Nurse Communication: Mobility status  Activity Tolerance: Patient tolerated treatment well Patient left: in bed;with call bell/phone within reach  OT Visit Diagnosis: Unsteadiness on feet (R26.81);Other abnormalities of gait and mobility (R26.89);Muscle weakness (generalized) (M62.81)                Time: 9323-5573 OT Time Calculation (min): 23 min Charges:  OT General Charges $OT Visit: 1 Visit OT Evaluation $OT Eval Moderate Complexity: 1 Mod  Bradd Canary, OTR/L Acute Rehab Services Office: [phone]   Lorre Munroe 01/27/2024, 2:50 PM

## 2024-01-27 NOTE — TOC Progression Note (Signed)
Transition of Care [facility]) - Progression Note    Patient Details  Name: [name] MRN: [id number] Date of Birth: 01-08-1981  Transition of Care [facility]) CM/SW Contact  Eduard Roux, [place] Phone Number: 01/27/2024, 9:31 AM  Clinical Narrative:     TOC continues to follow for medical readiness. Patient is from [facility]   Antony Blackbird, MSW, LCSW Clinical Social Worker    Expected Discharge Plan: Skilled Nursing Facility Barriers to Discharge: Continued Medical Work up  Expected Discharge Plan and Services In-house Referral: Clinical Social Work     Living arrangements for the past 2 months: Skilled Nursing Facility                                       Social Determinants of Health (SDOH) Interventions SDOH Screenings   Food Insecurity: Patient Declined (01/25/2024)  Housing: Unknown (01/25/2024)  Transportation Needs: Patient Declined (01/25/2024)  Utilities: Patient Declined (01/25/2024)  Depression (PHQ2-9): Low Risk  (04/29/2022)  Tobacco Use: High Risk (12/13/2023)    Readmission Risk Interventions    04/17/2022    8:36 AM  Readmission Risk Prevention Plan  Transportation Screening Complete  PCP or Specialist Appt within 3-5 Days Complete  HRI or Home Care Consult Complete  Social Work Consult for Recovery Care Planning/Counseling Complete  Palliative Care Screening Not Applicable  Medication Review [employer]) Complete

## 2024-01-27 NOTE — Progress Notes (Addendum)
Received patient in bed to unit.  Alert and oriented.  Informed consent signed and in chart.   TX duration:3.5  Patient tolerated well.  Transported back to the room  Alert, without acute distress.  Hand-off given to patient's nurse.   Access used: right AVF Access issues: none  Total UF removed: 3.3L Medication(s) given: albumin, benadryl   01/27/24 1753  Vitals  Temp (!) 97.5 F (36.4 C)  Temp Source Axillary  BP (!) 93/48  MAP (mmHg) (!) 63  BP Location Left Arm  BP Method Automatic  Patient Position (if appropriate) Lying  Pulse Rate (!) 163  Pulse Rate Source Monitor  ECG Heart Rate (!) 106  Resp (!) 21  Oxygen Therapy  SpO2 91 %  O2 Device Room Air  During Treatment Monitoring  Blood Flow Rate (mL/min) 299 mL/min  Arterial Pressure (mmHg) -217.16 mmHg  Venous Pressure (mmHg) 205.64 mmHg  TMP (mmHg) 66.26 mmHg  Ultrafiltration Rate (mL/min) 1977 mL/min  Dialysate Flow Rate (mL/min) 300 ml/min  Dialysate Potassium Concentration 2  Dialysate Calcium Concentration 2.5  Duration of HD Treatment -hour(s) 3.49 hour(s)  Cumulative Fluid Removed (mL) per Treatment  3238.32  HD Safety Checks Performed Yes  Intra-Hemodialysis Comments Tx completed  Dialysis Fluid Bolus Normal Saline  Bolus Amount (mL) 300 mL    Sebrena Engh [facility]

## 2024-01-27 NOTE — Progress Notes (Signed)
Nephrology Follow-Up Consult note   Assessment/Recommendations: [name] is a/an 43 y.o. female with a past medical history significant for ESRD, admitted for acute hypoxic respiratory failure.        [facility]   B400   45kg   2/2 bath  R AVF   Heparin none - last HD 1/25, post wt 46.2, gained 0.5kg during HD - hectorol 6 mcg tts - no esa, last hb 10.5     Assessment/ Plan: Acute hypoxic resp failure - w/ mild pulm edema on CXR. Suspect vol overload. However she is having issues w/ atrial fib/ RVR and soft BP's limiting UF on HD. Cont with midodrine 10mg  TID.  Continue with ultrafiltration as tolerated ESRD - on HD TTS.  Has received extra dialysis.  Continue with aggressive ultrafiltration as tolerated.  Dialysis today to get back on schedule BP - chronic hypotension on midodrine.  Anemia of esrd - Hb 9.7, not on esa at op unit. CTM and consider starting an extra treatment if she remains in the hospital Secondary hyperparathyroidism - CCa and phos acceptable. Cont binders w/ meals. Afib w/ RVR: per primary and cards  H/o SLE   Recommendations conveyed to primary service.    Darnell Level [facility] 01/27/2024 9:42 AM  ___________________________________________________________  CC: Shortness of breath  Interval History/Subjective: Patient feels well today.  Tolerated dialysis with 3.3 L removed.  Minimal shortness of breath and otherwise no complaints   Medications:  Current Facility-Administered Medications  Medication Dose Route Frequency Provider Last Rate Last Admin   acetaminophen (TYLENOL) tablet 1,000 mg  1,000 mg Oral Q8H PRN Core, Doy Hutching, MD   1,000 mg at 01/27/24 0817   albuterol (PROVENTIL) (2.5 MG/3ML) 0.083% nebulizer solution 2.5 mg  2.5 mg Nebulization Q2H PRN Core, Doy Hutching, MD   2.5 mg at 01/25/24 2343   amiodarone (PACERONE) tablet 200 mg  200 mg Oral Daily Core, Doy Hutching, MD   200 mg at 01/26/24 1478    benzonatate (TESSALON) capsule 200 mg  200 mg Oral TID PRN Kathrynn Running, MD   200 mg at 01/24/24 2126   Chlorhexidine Gluconate Cloth 2 % PADS 6 each  6 each Topical Q0600 Core, Doy Hutching, MD   6 each at 01/26/24 1038   cinacalcet (SENSIPAR) tablet 30 mg  30 mg Oral Q breakfast Core, Doy Hutching, MD   30 mg at 01/27/24 0813   diphenhydrAMINE (BENADRYL) capsule 25 mg  25 mg Oral BID PRN Lenny Pastel, PA-C   25 mg at 01/26/24 1656   heparin injection 5,000 Units  5,000 Units Subcutaneous Q8H Wouk, Wilfred Curtis, MD   5,000 Units at 01/27/24 0522   leptospermum manuka honey (MEDIHONEY) paste 1 Application  1 Application Topical Daily Core, Doy Hutching, MD   1 Application at 01/26/24 1039   melatonin tablet 3 mg  3 mg Oral QHS Core, Doy Hutching, MD   3 mg at 01/26/24 2035   midodrine (PROAMATINE) tablet 10 mg  10 mg Oral TID WC Core, Doy Hutching, MD   10 mg at 01/27/24 0813   multivitamin (RENA-VIT) tablet 1 tablet  1 tablet Oral QHS Core, Doy Hutching, MD   1 tablet at 01/26/24 2034   oxyCODONE (Oxy IR/ROXICODONE) immediate release tablet 5 mg  5 mg Oral Q4H PRN Howerter, Justin B, DO   5 mg at 01/27/24 0817   phenol (CHLORASEPTIC) mouth spray 1 spray  1 spray Mouth/Throat  PRN Kathrynn Running, MD   1 spray at 01/24/24 2126   sevelamer carbonate (RENVELA) tablet 2,400 mg  2,400 mg Oral TID WC Core, Doy Hutching, MD   2,400 mg at 01/27/24 0813   white petrolatum (VASELINE) gel   Topical PRN Kathrynn Running, MD   0.2 Application at 01/25/24 0346      Review of Systems: 10 systems reviewed and negative except per interval history/subjective  Physical Exam: Vitals:   01/27/24 0708 01/27/24 0807  BP:  102/61  Pulse:    Resp: 20 16  Temp:  97.7 F (36.5 C)  SpO2:  98%   No intake/output data recorded.  Intake/Output Summary (Last 24 hours) at 01/27/2024 4098 Last data filed at 01/26/2024 1941 Gross per 24 hour  Intake --  Output 3300 ml  Net -3300 ml    Constitutional: Chronically ill-appearing,  sitting in bed, no distress ENMT: ears and nose without scars or lesions, MMM CV: normal rate, no lower extremity edema Respiratory: Bilateral chest rise with mild creased work of breathing Gastrointestinal: soft, non-tender, no palpable masses or hernias Skin: no visible lesions or rashes Psych: alert, judgement/insight appropriate, appropriate mood and affect   Test Results I personally reviewed new and old clinical labs and radiology tests Lab Results  Component Value Date   NA 135 01/27/2024   K 4.6 01/27/2024   CL 95 (L) 01/27/2024   CO2 25 01/27/2024   BUN 37 (H) 01/27/2024   CREATININE 3.78 (H) 01/27/2024   CALCIUM 8.7 (L) 01/27/2024   ALBUMIN 2.8 (L) 01/27/2024   PHOS 4.3 01/27/2024    CBC Recent Labs  Lab 01/23/24 0530 01/24/24 1033 01/25/24 0748  WBC 9.1 6.9 10.8*  NEUTROABS 5.0  --   --   HGB 10.0* 9.0* 9.7*  HCT 33.6* 29.8* 31.7*  MCV 99.4 97.4 98.4  PLT 268 258 249

## 2024-01-27 NOTE — Consult Note (Signed)
ORTHOPAEDIC CONSULTATION  REQUESTING PHYSICIAN: Cathren Harsh, MD  Chief Complaint: Ulceration right thigh.  HPI: [name] is a 43 y.o. female who presents with soft tissue ulcer right thigh.  Patient states she has had a previous ulcer near the same location that is healed in the past.  Past Medical History:  Diagnosis Date   Anemia    low iron - receives iron at dialysis   Anxiety    Arthritis    RA   Atrial fibrillation with RVR (HCC) 12/14/2023   Chronic systolic congestive heart failure (HCC) 03/16/2016   Dyspnea    ESRD (end stage renal disease) (HCC)    [facility] _ [place]   H/O pericarditis 01/17/2013   H/O pleural effusion 01/17/2013   Heart murmur    Lupus (systemic lupus erythematosus) (HCC)    Previously followed with Dr. 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 pneumonae bacteremia on 01/21/2013. Sensitive to Peniccilin    Past Surgical History:  Procedure Laterality Date   A/V FISTULAGRAM N/A 12/15/2023   Procedure: A/V Fistulagram;  Surgeon: Ethelene Hal, MD;  Location: [facility] INVASIVE CV LAB;  Service: Cardiovascular;  Laterality: N/A;   AV FISTULA PLACEMENT     AV FISTULA PLACEMENT Right 08/01/2020   Procedure: RIGHT ARM BRACHIOCEPHALIC  ARTERIOVENOUS (AV) FISTULA CREATION;  Surgeon: Larina Earthly, MD;  Location: [facility] OR;  Service: Vascular;  Laterality: Right;   BASCILIC VEIN TRANSPOSITION Left 02/05/2014   Procedure: BASCILIC VEIN TRANSPOSITION;  Surgeon: Larina Earthly,  MD;  Location: [facility] OR;  Service: Vascular;  Laterality: Left;   BASCILIC VEIN TRANSPOSITION Right 01/08/2021   Procedure: RIGHT ARM SECOND STAGE BASCILIC VEIN TRANSPOSITION;  Surgeon: Larina Earthly, MD;  Location: [facility] OR;  Service: Vascular;  Laterality: Right;   BIOPSY  12/17/2021   Procedure: BIOPSY;  Surgeon: Lemar Lofty., MD;  Location: [facility] ENDOSCOPY;  Service: Gastroenterology;;   ESOPHAGOGASTRODUODENOSCOPY (EGD) WITH PROPOFOL N/A 12/17/2021   Procedure: ESOPHAGOGASTRODUODENOSCOPY (EGD) WITH PROPOFOL;  Surgeon: Lemar Lofty., MD;  Location: [facility] ENDOSCOPY;  Service: Gastroenterology;  Laterality: N/A;   FISTULA SUPERFICIALIZATION Left 05/30/2018   Procedure: FISTULA PLICATION BASILIC VEIN TRANSPOSITION;  Surgeon: Chuck Hint, MD;  Location: [facility] OR;  Service: Vascular;  Laterality: Left;   FISTULA SUPERFICIALIZATION Left 12/18/2019   Procedure: PLICATION OF LEFT ARTERIOVENOUS FISTULA ULCER;  Surgeon: Larina Earthly, MD;  Location: [facility] OR;  Service: Vascular;  Laterality: Left;   I & D EXTREMITY Right 02/18/2021   Procedure: IRRIGATION AND DEBRIDEMENT OF ARM;  Surgeon: Chuck Hint, MD;  Location: [facility] OR;  Service: Vascular;  Laterality: Right;   INSERTION OF DIALYSIS CATHETER N/A 05/19/2020   Procedure: TUNNELED INSERTION  OF DIALYSIS CATHETER;  Surgeon: Maeola Harman, MD;  Location: [facility] OR;  Service: Vascular;  Laterality: N/A;   PERIPHERAL VASCULAR BALLOON ANGIOPLASTY  12/15/2023   Procedure: PERIPHERAL VASCULAR BALLOON ANGIOPLASTY;  Surgeon: Ethelene Hal, MD;  Location: [facility] INVASIVE  CV LAB;  Service: Cardiovascular;;   THROMBECTOMY AND REVISION OF ARTERIOVENTOUS (AV) GORETEX  GRAFT Left 07/28/2020   Procedure: Oversewing of left arm Brachial cephalic fistula for bleeding.;  Surgeon: Chuck Hint, MD;  Location: [facility] OR;  Service: Vascular;  Laterality: Left;   VENOGRAM Right 01/31/2014   Procedure: DIALYSIS CATHETER;  Surgeon: Nada Libman, MD;   Location: [facility] CATH LAB;  Service: Cardiovascular;  Laterality: Right;   Social History   Socioeconomic History   Marital status: Single    Spouse name: Not on file   Number of children: Not on file   Years of education: Not on file   Highest education level: Not on file  Occupational History   Not on file  Tobacco Use   Smoking status: Every Day    Current packs/day: 0.12    Average packs/day: 0.1 packs/day for 15.0 years (1.8 ttl pk-yrs)    Types: Cigarettes    Passive exposure: Current   Smokeless tobacco: Never   Tobacco comments:    4 cigarettes per day  Vaping Use   Vaping status: Never Used  Substance and Sexual Activity   Alcohol use: Yes    Alcohol/week: 0.0 standard drinks of alcohol    Comment: Special Occasional takes Vicar   Drug use: Yes    Types: Marijuana    Comment: ocassional, last time- 12/21/20   Sexual activity: Not Currently    Birth control/protection: None  Other Topics Concern   Not on file  Social History Narrative   Not on file   Social Drivers of Health   Financial Resource Strain: Not on file  Food Insecurity: Patient Declined (01/25/2024)   Hunger Vital Sign    Worried About Running Out of Food in the Last Year: Patient declined    Ran Out of Food in the Last Year: Patient declined  Transportation Needs: Patient Declined (01/25/2024)   PRAPARE - Administrator, Civil Service (Medical): Patient declined    Lack of Transportation (Non-Medical): Patient declined  Physical Activity: Not on file  Stress: Not on file  Social Connections: Not on file   No family history on file. - negative except otherwise stated in the family history section Allergies  Allergen Reactions   Cephalosporins Rash    To both keflex and cefazolin.  Potential bullous lesion from Ceftriaxone 11/2023.   Tobramycin Sulfate Swelling    Eye swelling   Vancomycin Swelling   Prior to Admission medications   Medication Sig Start Date End Date Taking?  Authorizing Provider  albuterol (PROVENTIL) (2.5 MG/3ML) 0.083% nebulizer solution Take 3 mLs (2.5 mg total) by nebulization every 2 (two) hours as needed for wheezing or shortness of breath. Patient taking differently: Take 2.5 mg by nebulization every 6 (six) hours as needed for wheezing or shortness of breath. 12/06/22  Yes Gwenevere Abbot, MD  apixaban (ELIQUIS) 2.5 MG TABS tablet Take 1 tablet (2.5 mg total) by mouth 2 (two) times daily. 12/22/23  Yes Monna Fam, MD  cinacalcet (SENSIPAR) 30 MG tablet Take 30 mg by mouth every evening. 11/24/22  Yes [provider]  diphenhydrAMINE (BENADRYL) 25 MG tablet Take 1 tablet (25 mg total) by mouth every 4 (four) hours as needed for itching or allergies. 01/23/21  Yes Rolly Salter, MD  lidocaine (ASPERFLEX MAX ST) 4 % Place 1 patch onto the skin daily.   Yes [provider]  loperamide (IMODIUM) 2 MG capsule Take 1 capsule (2 mg total) by mouth  as needed for diarrhea or loose stools. Patient taking differently: Take 2 mg by mouth every 8 (eight) hours as needed for diarrhea or loose stools. 12/22/23  Yes Monna Fam, MD  melatonin 3 MG TABS tablet Take 1 tablet (3 mg total) by mouth at bedtime. 12/22/23  Yes Monna Fam, MD  Menthol, Topical Analgesic, (BIOFREEZE COOL THE PAIN) 4 % GEL Apply 1 Application topically every 6 (six) hours as needed (Right chest pain).   Yes [provider]  midodrine (PROAMATINE) 5 MG tablet Take 1 tablet (5 mg total) by mouth 3 (three) times daily with meals. 12/22/23  Yes Monna Fam, MD  multivitamin (RENA-VIT) TABS tablet Take 1 tablet by mouth daily.   Yes [provider]  predniSONE (DELTASONE) 20 MG tablet Take 40 mg by mouth daily with breakfast.   Yes [provider]  sevelamer carbonate (RENVELA) 800 MG tablet Take 800 mg by mouth 3 (three) times daily. 02/10/22  Yes [provider]  ZINC OXIDE, TOPICAL, 10 % CREA Apply 1 Application topically every 6 (six)  hours as needed (Itching).   Yes [provider]  doxercalciferol (HECTOROL) 4 MCG/2ML injection Use as recommended by nephrology Patient not taking: Reported on 01/24/2024 11/05/20   Kathlen Mody, MD  Nystatin (GERHARDT'S BUTT CREAM) CREA Apply 1 Application topically as needed for irritation. Patient not taking: Reported on 01/24/2024 12/22/23   Monna Fam, MD  nystatin (MYCOSTATIN) 100000 UNIT/ML suspension Take 5 mLs (500,000 Units total) by mouth 4 (four) times daily. Patient not taking: Reported on 01/24/2024 12/22/23   Monna Fam, MD   No results found. - pertinent xrays, CT, MRI studies were reviewed and independently interpreted  Positive ROS: All other systems have been reviewed and were otherwise negative with the exception of those mentioned in the HPI and as above.  Physical Exam: General: Alert, no acute distress Psychiatric: Patient is competent for consent with normal mood and affect Lymphatic: No axillary or cervical lymphadenopathy Cardiovascular: No pedal edema Respiratory: No cyanosis, no use of accessory musculature GI: No organomegaly, abdomen is soft and non-tender    Images:  @ENCIMAGES @  Labs:  Lab Results  Component Value Date   HGBA1C 5.4 12/18/2023   ESRSEDRATE 24 (H) 12/15/2023   ESRSEDRATE 121 (H) 04/12/2022   ESRSEDRATE 80 (H) 05/18/2020   CRP 17.7 (H) 12/15/2023   CRP 7.7 (H) 04/12/2022   CRP 0.9 05/18/2020   LABURIC 4.9 10/08/2008   REPTSTATUS PENDING 01/23/2024   GRAMSTAIN  04/14/2022    ABUNDANT WBC PRESENT, PREDOMINANTLY PMN NO ORGANISMS SEEN    CULT  01/23/2024    NO GROWTH 4 DAYS Performed at [facility], [address] [place], [place] [address]    Imelda Pillow KLEBSIELLA PNEUMONIAE 12/13/2023   LABORGA KLEBSIELLA PNEUMONIAE 12/13/2023    Lab Results  Component Value Date   ALBUMIN 2.8 (L) 01/27/2024   ALBUMIN 3.0 (L) 01/26/2024   ALBUMIN 2.7 (L) 01/23/2024   LABURIC 4.9 10/08/2008        Latest Ref Rng  & Units 01/27/2024   11:01 AM 01/25/2024    7:48 AM 01/24/2024   10:33 AM  CBC EXTENDED  WBC 4.0 - 10.5 K/uL 7.3  10.8  6.9   RBC 3.87 - 5.11 MIL/uL 3.34  3.22  3.06   Hemoglobin 12.0 - 15.0 g/dL 29.5  9.7  9.0   HCT 28.4 - 46.0 % 34.1  31.7  29.8   Platelets 150 - 400 K/uL 212  249  258     Neurologic: Patient does not have protective sensation bilateral lower extremities.   MUSCULOSKELETAL:   Skin: Examination patient has a superficial ulcer over the anterior medial aspect of the right thigh.  There is healthy granulation tissue beneath the necrotic wound.  There is no induration there is no surrounding cellulitis, no signs of calciphylaxis.  The thigh is soft nontender to palpation.  Assessment: Assessment: Wound anterior medial aspect right thigh.  Plan: I will place orders for Vashe dressing changes daily.  Once the wound is debrided back to healthy viable granulation tissue this should heal without surgical intervention.  Thank you for the consult and the opportunity to see Ms. [name] Boss, MD [facility] [phone] 7:45 PM

## 2024-01-27 NOTE — Progress Notes (Signed)
Initial Nutrition Assessment  DOCUMENTATION CODES:   Severe malnutrition in context of chronic illness, Underweight  INTERVENTION:  Liberalize diet to regular to promote adequate PO intake Nepro Shake po BID, each supplement provides 425 kcal and 19 grams protein Continue Renal MVI with minerals daily  NUTRITION DIAGNOSIS:   Severe Malnutrition related to chronic illness (ESRD, lupus, HF) as evidenced by severe fat depletion, severe muscle depletion.  GOAL:   Patient will meet greater than or equal to 90% of their needs  MONITOR:   PO intake, Supplement acceptance, Labs, Weight trends  REASON FOR ASSESSMENT:   Consult Assessment of nutrition requirement/status  ASSESSMENT:   Pt presented from SNF with SOB, admitted with volume overload in the setting of ESRD. PMH significant for ESRD on HD, HFrEF, lupus with nephritis, recent admission to [facility] for volume overload, aflutter and flu A.   1/28 iHD, no UF d/t hypotension 1/29 iHD, net UF 3.3L Next iHD today to get back on schedule.   Ortho evaluation pending of R open thigh wound.   Spoke with pt at bedside. She reports that her appetite has been fair. PTA she was in a SNF for about 2 weeks and reports that the food was canned and very salty. She doesn't typically follow any specific diet in general. Pt is familiar with Nepro and is reliant on these supplements to augment her nutrition and states that she would drink as many as she can. Noted one on bedside table that was 100% consumed. Observed breakfast plate on counter which pt consumed about 50% of an omelet. She denies difficulty chewing/swallowing foods.   Meal completions: 1/29: 40% breakfast  EDW: 45 kg Current weight: 42.3 kg Pt is uncertain of her weight though states that she feels that she has been losing some weight which she feels has been more related to fluid losses.   Medications: sensipar, melatonin, rena-vit, renvela  Labs:  BUN 37 Cr  3.78 Albumin 2.8 GFR 15  NUTRITION - FOCUSED PHYSICAL EXAM:  Flowsheet Row Most Recent Value  Orbital Region Mild depletion  Upper Arm Region Severe depletion  Thoracic and Lumbar Region Severe depletion  Buccal Region Mild depletion  Temple Region Mild depletion  Clavicle Bone Region Severe depletion  Clavicle and Acromion Bone Region Severe depletion  Scapular Bone Region Severe depletion  Dorsal Hand Moderate depletion  Patellar Region Severe depletion  Anterior Thigh Region Severe depletion  Posterior Calf Region Severe depletion  Edema (RD Assessment) None  Hair Reviewed  Eyes Reviewed  Mouth Reviewed  Skin Reviewed  Nails Reviewed       Diet Order:   Diet Order             Diet regular Room service appropriate? Yes; Fluid consistency: Thin; Fluid restriction: 1200 mL Fluid  Diet effective now                   EDUCATION NEEDS:   Education needs have been addressed  Skin:  Skin Assessment: Skin Integrity Issues: Skin Integrity Issues:: Other (Comment) Other: R thigh open/full thickness, purulent  Last BM:  1/29  Height:   Ht Readings from Last 1 Encounters:  01/23/24 5\' 3"  (1.6 m)    Weight:   Wt Readings from Last 1 Encounters:  01/27/24 42.3 kg   BMI:  Body mass index is 16.52 kg/m.  Estimated Nutritional Needs:   Kcal:  1300-1500  Protein:  65-80g  Fluid:  1L + UOP  Drusilla Kanner, RDN, LDN  Clinical Nutrition

## 2024-01-27 NOTE — Progress Notes (Signed)
[facility]                                                                              [name], is a 43 y.o. female, DOB - [birth year], ZOX:[id number] Admit date - 01/23/2024    Outpatient Primary MD for the patient is [name], NP  LOS - 4  days  Chief Complaint  Patient presents with   Shortness of Breath       Brief summary   Patient is a 43 year old female with ESRD on HD, CHF, SLE with recent admission to [facility] 12/2023 for volume overload, a flutter and influenza A.  She was discharged to SNF.   Apparently ultrafiltration was limited due to hypotension and atrial flutter.  At the SNF she was worsening dyspnea. On the day of admission she had acute and severe PND, EMS was called, found to be hypoxic with O2 sats of 73% on 3 L O2, HR 150.   In ED, BP 112/61, HR 106, RR 24 and 02 saturation 100% on supplemental 02.  On exam, was found to have volume overload with rales bilaterally, atrial fibrillation.  Nephrology was consulted for emergent HD.  Assessment & Plan    Principal Problem: Acute respiratory failure with hypoxia likely due to volume overload, acute on chronic systolic CHF exacerbation, ESRD -Underwent emergent HD, O2 sats 93 to 98% on 3 L.  Wean O2 as tolerated   Active Problems: ESRD on hemodialysis, TTS -Nephrology following, has received extra dialysis, -Now back on TTS schedule.     Acute on chronic systolic CHF (congestive heart failure) (HCC), with volume overload -2D echo showed EF of 30 to 35% with global hypokinesis, RV systolic function with moderate reduction, LA with severe dilation, RA with moderate dilation, mild MS, mild MR (rheumatic), moderate to severe TR, aortic valve with moderate to severe AR. -Volume management with hemodialysis -Considering biventricular failure and valvular disease, overall prognosis poor.  Per cardiology not likely to become a candidate for valve replacement.    Atrial fibrillation (HCC)  with RVR -Continue rate control with amiodarone 200 mg daily -Not on anticoagulation due to high risk of bleeding, recent falls, history of SDH in the past. -Seen by cardiology this admission -Currently on midodrine 10 mg 3 times daily for BP.  Toprol 12.5 mg daily on hold  Open right thigh wound, chronic, POA -Dr. Lajoyce Corners has been consulted, will await recommendations. -Wound care following - Dr Ashok Pall had discussed with Dr. Lajoyce Corners on 9/27 and was recommended possible surgery on Friday, 1/31.  Please speak with HD   Lupus (systemic lupus erythematosus) (HCC) -Stable, no acute flare, has history of lupus nephritis     Substance abuse (HCC) Patient denies any cocaine use since last year    Underweight Estimated body mass index is 16.52 kg/m as calculated from the following:   Height as of this encounter: 5\' 3"  (1.6 m).   Weight as of this encounter: 42.3 kg.  Code Status: Full CODE STATUS DVT Prophylaxis:  heparin injection 5,000 Units Start: 01/24/24 2200   Level of Care: Level of care: Telemetry Cardiac Family Communication: Updated  patient Disposition Plan:      Remains inpatient appropriate:      Procedures:  2D echo HD  Consultants:   Nephrology Cardiology Orthopedics, Dr. Lajoyce Corners  Antimicrobials:   Anti-infectives (From admission, onward)    None          Medications  amiodarone  200 mg Oral Daily   Chlorhexidine Gluconate Cloth  6 each Topical Q0600   cinacalcet  30 mg Oral Q breakfast   heparin  5,000 Units Subcutaneous Q8H   leptospermum manuka honey  1 Application Topical Daily   melatonin  3 mg Oral QHS   midodrine  10 mg Oral TID WC   multivitamin  1 tablet Oral QHS   sevelamer carbonate  2,400 mg Oral TID WC      Subjective:   [name] was seen and examined today.  Feeling very sleepy, deconditioned, dyspnea.  Otherwise no acute chest pain fevers or chills.  States right thigh wound smells, waiting for orthopedic surgery evaluation..   Patient denies dizziness, abdominal pain, N/V/D/C. Objective:   Vitals:   01/27/24 0114 01/27/24 0444 01/27/24 0708 01/27/24 0807  BP: (!) 100/53 (!) 91/53  102/61  Pulse: (!) 112 (!) 102    Resp: 15 18 20 16   Temp: 98.1 F (36.7 C) 97.9 F (36.6 C)  97.7 F (36.5 C)  TempSrc: Oral Oral  Oral  SpO2: 96% 96%  98%  Weight:   42.3 kg   Height:        Intake/Output Summary (Last 24 hours) at 01/27/2024 1014 Last data filed at 01/26/2024 1941 Gross per 24 hour  Intake --  Output 3300 ml  Net -3300 ml     Wt Readings from Last 3 Encounters:  01/27/24 42.3 kg  12/18/23 57 kg  12/05/22 42.4 kg     Exam General: Alert and oriented x 3, NAD Cardiovascular: S1 S2 auscultated,  RRR Respiratory: dec BS at the bases. Gastrointestinal: Soft, nontender, nondistended, + bowel sounds Ext: no pedal edema bilaterally Neuro: no new deficits Skin: Dressing intact on the right upper thigh. Psych: Normal affect     Data Reviewed:  I have personally reviewed following labs    CBC Lab Results  Component Value Date   WBC 10.8 (H) 01/25/2024   RBC 3.22 (L) 01/25/2024   HGB 9.7 (L) 01/25/2024   HCT 31.7 (L) 01/25/2024   MCV 98.4 01/25/2024   MCH 30.1 01/25/2024   PLT 249 01/25/2024   MCHC 30.6 01/25/2024   RDW 21.0 (H) 01/25/2024   LYMPHSABS 2.9 01/23/2024   MONOABS 1.1 (H) 01/23/2024   EOSABS 0.1 01/23/2024   BASOSABS 0.0 01/23/2024     Last metabolic panel Lab Results  Component Value Date   NA 135 01/27/2024   K 4.6 01/27/2024   CL 95 (L) 01/27/2024   CO2 25 01/27/2024   BUN 37 (H) 01/27/2024   CREATININE 3.78 (H) 01/27/2024   GLUCOSE 94 01/27/2024   GFRNONAA 15 (L) 01/27/2024   GFRAA 9 (L) 08/02/2020   CALCIUM 8.7 (L) 01/27/2024   PHOS 4.3 01/27/2024   PROT 7.6 12/13/2023   ALBUMIN 2.8 (L) 01/27/2024   BILITOT 2.7 (H) 12/13/2023   ALKPHOS 199 (H) 12/13/2023   AST 26 12/13/2023   ALT 27 12/13/2023   ANIONGAP 15 01/27/2024    CBG (last 3)  No results for  input(s): "GLUCAP" in the last 72 hours.    Coagulation Profile: No results for input(s): "INR", "PROTIME" in the last 168  hours.   Radiology Studies: I have personally reviewed the imaging studies  No results found.     Thad Ranger M.D. [facility] 01/27/2024, 10:14 AM  Available via Epic secure chat 7am-7pm After 7 pm, please refer to night coverage provider listed on amion.

## 2024-01-28 DIAGNOSIS — J9621 Acute and chronic respiratory failure with hypoxia: Secondary | ICD-10-CM | POA: Diagnosis not present

## 2024-01-28 DIAGNOSIS — E43 Unspecified severe protein-calorie malnutrition: Secondary | ICD-10-CM | POA: Insufficient documentation

## 2024-01-28 DIAGNOSIS — N186 End stage renal disease: Secondary | ICD-10-CM | POA: Diagnosis not present

## 2024-01-28 DIAGNOSIS — N189 Chronic kidney disease, unspecified: Secondary | ICD-10-CM | POA: Diagnosis not present

## 2024-01-28 DIAGNOSIS — J810 Acute pulmonary edema: Secondary | ICD-10-CM | POA: Diagnosis not present

## 2024-01-28 LAB — CULTURE, BLOOD (ROUTINE X 2)
Culture: NO GROWTH
Culture: NO GROWTH

## 2024-01-28 MED ORDER — CHLORHEXIDINE GLUCONATE CLOTH 2 % EX PADS
6.0000 | MEDICATED_PAD | Freq: Every day | CUTANEOUS | Status: DC
  Administered 2024-01-28 – 2024-01-30 (×3): 6 via TOPICAL

## 2024-01-28 NOTE — Progress Notes (Signed)
[facility] Progress Note   Subjective: Seen in room, lying supine in bed. Says her breathing is much better. HD 01/27/2024 Net UF 3.3 liters  Objective Vitals:   01/27/24 1824 01/27/24 2018 01/28/24 0528 01/28/24 0530  BP:  (!) 93/52  (!) 98/53  Pulse:    100  Resp:  19 (!) 26 18  Temp:    (!) 97.5 F (36.4 C)  TempSrc:    Oral  SpO2:  94%  100%  Weight: 40.7 kg  39.6 kg   Height:       Physical Exam General:Chronically ill appearing female in NAD Heart:S1,S2 RRR No M/R/G Lungs: CTAB A/P slightly decreased in bases posteriorly Abdomen: flat, NT NABS Extremities: No LE edema Dialysis Access: R AVF + T/B    Additional Objective Labs: Basic Metabolic Panel: Recent Labs  Lab 01/23/24 0530 01/25/24 0306 01/26/24 0346 01/27/24 0252  NA 138 139 135 135  K 3.5 4.2 4.4 4.6  CL 95* 100 93* 95*  CO2 28 25 25 25   GLUCOSE 100* 79 67* 94  BUN 39* 44* 34* 37*  CREATININE 4.56* 4.55* 3.72* 3.78*  CALCIUM 8.2* 8.9 8.8* 8.7*  PHOS 4.5  --  4.4 4.3   Liver Function Tests: Recent Labs  Lab 01/23/24 0530 01/26/24 0346 01/27/24 0252  ALBUMIN 2.7* 3.0* 2.8*   No results for input(s): "LIPASE", "AMYLASE" in the last 168 hours. CBC: Recent Labs  Lab 01/23/24 0530 01/24/24 1033 01/25/24 0748 01/27/24 1101  WBC 9.1 6.9 10.8* 7.3  NEUTROABS 5.0  --   --   --   HGB 10.0* 9.0* 9.7* 10.1*  HCT 33.6* 29.8* 31.7* 34.1*  MCV 99.4 97.4 98.4 102.1*  PLT 268 258 249 212   Blood Culture    Component Value Date/Time   SDES BLOOD LEFT ANTECUBITAL 01/23/2024 1309   SPECREQUEST  01/23/2024 1309    BOTTLES DRAWN AEROBIC AND ANAEROBIC Blood Culture results may not be optimal due to an inadequate volume of blood received in culture bottles   CULT  01/23/2024 1309    NO GROWTH 5 DAYS Performed at [facility], [address] [place], [place] [address]    REPTSTATUS 01/28/2024 FINAL 01/23/2024 1309    Cardiac Enzymes: No results for input(s): "CKTOTAL",  "CKMB", "CKMBINDEX", "TROPONINI" in the last 168 hours. CBG: Recent Labs  Lab 01/23/24 0715  GLUCAP 79   Iron Studies: No results for input(s): "IRON", "TIBC", "TRANSFERRIN", "FERRITIN" in the last 72 hours. @lablastinr3 @ Studies/Results: No results found. Medications:   amiodarone  200 mg Oral Daily   Chlorhexidine Gluconate Cloth  6 each Topical Q0600   cinacalcet  30 mg Oral Q breakfast   feeding supplement (NEPRO CARB STEADY)  237 mL Oral BID BM   heparin  5,000 Units Subcutaneous Q8H   leptospermum manuka honey  1 Application Topical Daily   melatonin  3 mg Oral QHS   midodrine  10 mg Oral TID WC   multivitamin  1 tablet Oral QHS   sevelamer carbonate  2,400 mg Oral TID WC     P HD: [facility] 3[facility]   400   45kg   2/2 bath  R AVF   Heparin none - last HD 1/25, post wt 46.2, gained 0.5kg during HD - hectorol 6 mcg tts - no esa, last hb 10.5     Assessment/ Plan: Acute hypoxic resp failure - w/ mild pulm edema on CXR. Suspect vol overload. However she is having  issues w/ atrial fib/ RVR and soft BP's limiting UF on HD. Cont with midodrine 10mg  TID.  Continue with ultrafiltration as tolerated. If weights are accurate she is very much under OP EDW Will get standing wt and adjust EDW on discharge ESRD - on HD TTS.  Has received extra dialysis.  Continue with aggressive ultrafiltration as tolerated. Next HD 01/29/2024 on schedule.  Hypotension- chronic hypotension on midodrine. 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 Follow HGB Secondary hyperparathyroidism - CCa and phos acceptable. Cont binders w/ meals. Afib w/ RVR: per primary and cards  H/o SLE  Saiya Crist H. Meliah Appleman NP-C 01/28/2024, 9:04 AM  [employer] [phone]

## 2024-01-28 NOTE — Progress Notes (Signed)
Patient has refused her wound care and dressing change for this morning, she says she may agree to having those procedures done late morning.

## 2024-01-28 NOTE — Progress Notes (Signed)
Informed to the MD abt her HR >115, patient was calm and comfortable, denied symptoms, no any interventions done  01/28/24 0938  Vitals  Temp 98 F (36.7 C)  Temp Source Oral  BP (!) 95/58  MAP (mmHg) 70  BP Location Left Arm  BP Method Automatic  Patient Position (if appropriate) Lying  Pulse Rate (!) 118  Pulse Rate Source Monitor  ECG Heart Rate (!) 118  Resp 19  MEWS COLOR  MEWS Score Color Yellow  Oxygen Therapy  SpO2 96 %  O2 Device Room Air  Pain Assessment  Pain Scale 0-10  Pain Score 5  Pain Type Acute pain  Pain Location Generalized  Pain Descriptors / Indicators Aching  Pain Frequency Intermittent  Pain Onset Gradual  Patients Stated Pain Goal 0  Pain Intervention(s) Medication (See eMAR)  MEWS Score  MEWS Temp 0  MEWS Systolic 1  MEWS Pulse 2  MEWS RR 0  MEWS LOC 0  MEWS Score 3

## 2024-01-28 NOTE — Progress Notes (Signed)
Physical Therapy Treatment Patient Details Name: [name] MRN: [id number] DOB: 05/30/1981 Today's Date: 01/28/2024   History of Present Illness 43 y.o. female admitted 01/23/24 with SOB and Rt open thigh wound. PMHx: Recent [facility] admission for acute respiratory failure and volume overload.  Fell at [facility] 12/25/24 and sustained Rt rib fxs, has been on O2 since then at [facility].  Anemia, arthritis, CHF, ESRD on HD, heart murmur, lupus, polysubstance abuse, seizures, chronic Rt SDH    PT Comments  Pt pleasant and able to significantly progress mobility. Pt reports she wasn't walking much during her ST-SNF rehab stay because of fear of falling and that has improved. Pt reports walking to bathroom and sink acutely without assist. Marriana walked in hall and up 8 stairs during session and would benefit from HHPT to maximize safety and function. Shakina was unable to state where she could go at D/C mom vs daughter's house stating "I haven't really thought about it". If pt has housing return home with HHPT appropriate. Will continue to follow acutely.  HR 112-116 with activity SPO2 98% on RA    If plan is discharge home, recommend the following: A little help with walking and/or transfers;A little help with bathing/dressing/bathroom;Assist for transportation   Can travel by private vehicle     Yes  Equipment Recommendations  None recommended by PT    Recommendations for Other Services       Precautions / Restrictions Precautions Precautions: Fall     Mobility  Bed Mobility Overal bed mobility: Modified Independent             General bed mobility comments: pt able to get out of and into bed without assist    Transfers Overall transfer level: Modified independent                 General transfer comment: pt performed 5 repeated STS from EOB after gait without use of hands    Ambulation/Gait Ambulation/Gait assistance: Supervision Gait Distance (Feet): 400  Feet Assistive device: Rolling walker (2 wheels) Gait Pattern/deviations: Step-through pattern, Decreased stride length   Gait velocity interpretation: <1.8 ft/sec, indicate of risk for recurrent falls   General Gait Details: cues for proximity to RW   Stairs Stairs: Yes Stairs assistance: Supervision Stair Management: Alternating pattern, Forwards, One rail Left Number of Stairs: 8     Wheelchair Mobility     Tilt Bed    Modified Rankin (Stroke Patients Only)       Balance Overall balance assessment: Needs assistance, History of Falls Sitting-balance support: No upper extremity supported, Feet supported Sitting balance-Leahy Scale: Good     Standing balance support: No upper extremity supported, During functional activity Standing balance-Leahy Scale: Good Standing balance comment: pt able to stand at sink to wash hands and face without support and walk short distance, Rw in hall per pt preference                            Cognition Arousal: Alert Behavior During Therapy: [facility] for tasks assessed/performed Overall Cognitive Status: Within Functional Limits for tasks assessed                                          Exercises      General Comments        Pertinent Vitals/Pain Pain Assessment  Pain Assessment: No/denies pain    Home Living                          Prior Function            PT Goals (current goals can now be found in the care plan section) Acute Rehab PT Goals Time For Goal Achievement: 02/11/24 Potential to Achieve Goals: Good Progress towards PT goals: Progressing toward goals    Frequency    Min 1X/week      PT Plan      Co-evaluation              AM-PAC PT "6 Clicks" Mobility   Outcome Measure  Help needed turning from your back to your side while in a flat bed without using bedrails?: None Help needed moving from lying on your back to sitting on the side of a flat bed  without using bedrails?: None Help needed moving to and from a bed to a chair (including a wheelchair)?: None Help needed standing up from a chair using your arms (e.g., wheelchair or bedside chair)?: None Help needed to walk in hospital room?: A Little Help needed climbing 3-5 steps with a railing? : A Little 6 Click Score: 22    End of Session   Activity Tolerance: Patient tolerated treatment well Patient left: in bed;with call bell/phone within reach (pt denied OOB to chair) Nurse Communication: Mobility status PT Visit Diagnosis: Unsteadiness on feet (R26.81);History of falling (Z91.81);Other abnormalities of gait and mobility (R26.89)     Time: 2956-2130 PT Time Calculation (min) (ACUTE ONLY): 36 min  Charges:    $Gait Training: 8-22 mins $Therapeutic Activity: 8-22 mins PT General Charges $$ ACUTE PT VISIT: 1 Visit                     Merryl Hacker, PT Acute Rehabilitation Services Office: [phone]    Enedina Finner Reyann Troop 01/28/2024, 1:35 PM

## 2024-01-28 NOTE — Progress Notes (Addendum)
[facility]                                                                              Radonna Bracher, is a 43 y.o. female, DOB - 26-Mar-1981, ZOX:[id number] Admit date - 01/23/2024    Outpatient Primary MD for the patient is [name], NP  LOS - 5  days  Chief Complaint  Patient presents with   Shortness of Breath       Brief summary   Patient is a 43 year old female with ESRD on HD, CHF, SLE with recent admission to [facility] 12/2023 for volume overload, a flutter and influenza A.  She was discharged to SNF.   Apparently ultrafiltration was limited due to hypotension and atrial flutter.  At the SNF she was worsening dyspnea. On the day of admission she had acute and severe PND, EMS was called, found to be hypoxic with O2 sats of 73% on 3 L O2, HR 150.   In ED, BP 112/61, HR 106, RR 24 and 02 saturation 100% on supplemental 02.  On exam, was found to have volume overload with rales bilaterally, atrial fibrillation.  Nephrology was consulted for emergent HD.  Assessment & Plan    Principal Problem: Acute respiratory failure with hypoxia likely due to volume overload, acute on chronic systolic CHF exacerbation, ESRD -Underwent emergent HD, O2 sats 93 to 98% on 3 L.   -Continue to wean O2 as tolerated   Active Problems: ESRD on hemodialysis, TTS -Nephrology following, has received extra dialysis, -Now back on TTS schedule.     Acute on chronic systolic CHF (congestive heart failure) (HCC), with volume overload -2D echo showed EF of 30 to 35% with global hypokinesis, RV systolic function with moderate reduction, LA with severe dilation, RA with moderate dilation, mild MS, mild MR (rheumatic), moderate to severe TR, aortic valve with moderate to severe AR. -Continue HD for volume management.  -Considering biventricular failure and valvular disease, overall prognosis poor.  Per cardiology not likely to become a candidate for valve replacement.    Atrial  fibrillation (HCC) with RVR -Continue rate control with amiodarone 200 mg daily -Not on anticoagulation due to high risk of bleeding, recent falls, history of SDH in the past. -Seen by cardiology this admission.  Toprol 12.5 mg daily on hold due to borderline BP. -Currently on midodrine 10 mg 3 times daily for BP.    Open right thigh wound, chronic, POA -Wound care following, -Appreciate Dr. Lajoyce Corners for evaluating the patient, recommended Vashe dressing changes daily.  Once wound is debrided back to healthy viable granulation tissue, should heal without surgical intervention.   Lupus (systemic lupus erythematosus) (HCC) -Stable, no acute flare, has history of lupus nephritis     Substance abuse (HCC) Patient denies any cocaine use since last year    Underweight, severe protein calorie malnutrition Estimated body mass index is 15.46 kg/m as calculated from the following:   Height as of this encounter: 5\' 3"  (1.6 m).   Weight as of this encounter: 39.6 kg.  Code Status: Full CODE STATUS DVT Prophylaxis:  heparin injection 5,000 Units Start: 01/24/24 2200  Level of Care: Level of care: Telemetry Cardiac Family Communication: Updated patient's daughter, Bernita Buffy who was on the phone during my encounter. Disposition Plan:      Remains inpatient appropriate:   During encounter, patient mentioned that she would like to go to a different facility, TOC updated.  Procedures:  2D echo HD  Consultants:   Nephrology Cardiology Orthopedics, Dr. Lajoyce Corners  Antimicrobials:   Anti-infectives (From admission, onward)    None          Medications  amiodarone  200 mg Oral Daily   Chlorhexidine Gluconate Cloth  6 each Topical Q0600   cinacalcet  30 mg Oral Q breakfast   feeding supplement (NEPRO CARB STEADY)  237 mL Oral BID BM   heparin  5,000 Units Subcutaneous Q8H   leptospermum manuka honey  1 Application Topical Daily   melatonin  3 mg Oral QHS   midodrine  10 mg Oral TID WC    multivitamin  1 tablet Oral QHS   sevelamer carbonate  2,400 mg Oral TID WC      Subjective:   Jazline Cumbee was seen and examined today.  No acute complaints.  BP remains borderline low.  Has pain in the right thigh wound.  No nausea vomiting, chest pain or shortness of breath.   Objective:   Vitals:   01/28/24 0530 01/28/24 0908 01/28/24 0938 01/28/24 1206  BP: (!) 98/53 (!) 88/52 (!) 95/58 (!) 95/58  Pulse: 100  (!) 118 (!) 118  Resp: 18 20 19 18   Temp: (!) 97.5 F (36.4 C) 98 F (36.7 C) 98 F (36.7 C) 97.8 F (36.6 C)  TempSrc: Oral Oral Oral Oral  SpO2: 100%  96% 96%  Weight:      Height:        Intake/Output Summary (Last 24 hours) at 01/28/2024 1325 Last data filed at 01/28/2024 0730 Gross per 24 hour  Intake 590 ml  Output 3300 ml  Net -2710 ml     Wt Readings from Last 3 Encounters:  01/28/24 39.6 kg  12/18/23 57 kg  12/05/22 42.4 kg    Physical Exam General: Alert and oriented x 3, NAD, cachectic, ill-appearing Cardiovascular: S1 S2 clear, RRR.  Respiratory: CTAB, no wheezing Gastrointestinal: Soft, nontender, nondistended, NBS Ext: no pedal edema bilaterally Neuro: no new deficits Skin: Dressing intact on the right upper thigh Psych: Normal affect   Data Reviewed:  I have personally reviewed following labs    CBC Lab Results  Component Value Date   WBC 7.3 01/27/2024   RBC 3.34 (L) 01/27/2024   HGB 10.1 (L) 01/27/2024   HCT 34.1 (L) 01/27/2024   MCV 102.1 (H) 01/27/2024   MCH 30.2 01/27/2024   PLT 212 01/27/2024   MCHC 29.6 (L) 01/27/2024   RDW 21.0 (H) 01/27/2024   LYMPHSABS 2.9 01/23/2024   MONOABS 1.1 (H) 01/23/2024   EOSABS 0.1 01/23/2024   BASOSABS 0.0 01/23/2024     Last metabolic panel Lab Results  Component Value Date   NA 135 01/27/2024   K 4.6 01/27/2024   CL 95 (L) 01/27/2024   CO2 25 01/27/2024   BUN 37 (H) 01/27/2024   CREATININE 3.78 (H) 01/27/2024   GLUCOSE 94 01/27/2024   GFRNONAA 15 (L) 01/27/2024   GFRAA  9 (L) 08/02/2020   CALCIUM 8.7 (L) 01/27/2024   PHOS 4.3 01/27/2024   PROT 7.6 12/13/2023   ALBUMIN 2.8 (L) 01/27/2024   BILITOT 2.7 (H) 12/13/2023   ALKPHOS 199 (H) 12/13/2023  AST 26 12/13/2023   ALT 27 12/13/2023   ANIONGAP 15 01/27/2024    CBG (last 3)  No results for input(s): "GLUCAP" in the last 72 hours.    Coagulation Profile: No results for input(s): "INR", "PROTIME" in the last 168 hours.   Radiology Studies: I have personally reviewed the imaging studies  No results found.     Thad Ranger M.D. [facility] 01/28/2024, 1:25 PM  Available via Epic secure chat 7am-7pm After 7 pm, please refer to night coverage provider listed on amion.

## 2024-01-28 NOTE — Plan of Care (Signed)
  Problem: Clinical Measurements: Goal: Will remain free from infection Outcome: Progressing   Problem: Nutrition: Goal: Adequate nutrition will be maintained Outcome: Progressing   Problem: Pain Managment: Goal: General experience of comfort will improve and/or be controlled Outcome: Progressing   Problem: Skin Integrity: Goal: Risk for impaired skin integrity will decrease Outcome: Progressing

## 2024-01-28 NOTE — Progress Notes (Addendum)
RN went in Pt's room, patient was lying down on her bed, explained about the need to change her dressing, she was agitated and refused for dressing change with multiple request.  She had requested for pain meds for generalized pain on her body, PRN was given before.   Will see pt again for dressing change,   Dressing changed later.

## 2024-01-29 DIAGNOSIS — N189 Chronic kidney disease, unspecified: Secondary | ICD-10-CM | POA: Diagnosis not present

## 2024-01-29 DIAGNOSIS — J810 Acute pulmonary edema: Secondary | ICD-10-CM | POA: Diagnosis not present

## 2024-01-29 DIAGNOSIS — J9621 Acute and chronic respiratory failure with hypoxia: Secondary | ICD-10-CM | POA: Diagnosis not present

## 2024-01-29 DIAGNOSIS — N186 End stage renal disease: Secondary | ICD-10-CM | POA: Diagnosis not present

## 2024-01-29 LAB — CBC
HCT: 29.9 % — ABNORMAL LOW (ref 36.0–46.0)
Hemoglobin: 9.2 g/dL — ABNORMAL LOW (ref 12.0–15.0)
MCH: 29.4 pg (ref 26.0–34.0)
MCHC: 30.8 g/dL (ref 30.0–36.0)
MCV: 95.5 fL (ref 80.0–100.0)
Platelets: 243 10*3/uL (ref 150–400)
RBC: 3.13 MIL/uL — ABNORMAL LOW (ref 3.87–5.11)
RDW: 20.6 % — ABNORMAL HIGH (ref 11.5–15.5)
WBC: 7.8 10*3/uL (ref 4.0–10.5)
nRBC: 0.5 % — ABNORMAL HIGH (ref 0.0–0.2)

## 2024-01-29 LAB — RENAL FUNCTION PANEL
Albumin: 3.2 g/dL — ABNORMAL LOW (ref 3.5–5.0)
Anion gap: 19 — ABNORMAL HIGH (ref 5–15)
BUN: 71 mg/dL — ABNORMAL HIGH (ref 6–20)
CO2: 22 mmol/L (ref 22–32)
Calcium: 8.2 mg/dL — ABNORMAL LOW (ref 8.9–10.3)
Chloride: 94 mmol/L — ABNORMAL LOW (ref 98–111)
Creatinine, Ser: 6.64 mg/dL — ABNORMAL HIGH (ref 0.44–1.00)
GFR, Estimated: 8 mL/min — ABNORMAL LOW (ref >60)
Glucose, Bld: 93 mg/dL (ref 70–99)
Phosphorus: 5.8 mg/dL — ABNORMAL HIGH (ref 2.5–4.6)
Potassium: 3.9 mmol/L (ref 3.5–5.1)
Sodium: 135 mmol/L (ref 135–145)

## 2024-01-29 LAB — GLUCOSE, CAPILLARY: Glucose-Capillary: 61 mg/dL — ABNORMAL LOW (ref 70–99)

## 2024-01-29 NOTE — Progress Notes (Signed)
Nephrology Follow-Up Consult note   Assessment/Recommendations: [name] is a/an 43 y.o. female with a past medical history significant for ESRD, admitted for acute hypoxic respiratory failure.        [facility]: [facility]   B400   45kg   2/2 bath  R AVF   Heparin none - last HD 1/25, post wt 46.2, gained 0.5kg during HD - hectorol 6 mcg tts - no esa, last hb 10.5     Assessment/ Plan: Acute hypoxic resp failure - w/ mild pulm edema on CXR. Suspect vol overload. However she is having issues w/ atrial fib/ RVR and soft BP's limiting UF on HD. Cont with midodrine 10mg  TID.  Continue with ultrafiltration as tolerated ESRD - on HD TTS.  Has received extra dialysis.  Continue with aggressive ultrafiltration as tolerated.  Maintain TTS schedule at this time BP - chronic hypotension on midodrine.  Anemia of esrd -hemoglobin around 9-10.  Consider ESA if needed. Secondary hyperparathyroidism - CCa and phos acceptable. Cont binders w/ meals. Afib w/ RVR: per primary and cards  H/o SLE Leg wound: Orthopedics has been involved.  Wound care involved   Recommendations conveyed to primary service.    [facility] 01/29/2024 9:31 AM  ___________________________________________________________  CC: Shortness of breath  Interval History/Subjective: Patient states she feels fine today.  Feels like her volume status is much improved.  Minimal shortness of breath   Medications:  Current Facility-Administered Medications  Medication Dose Route Frequency Provider Last Rate Last Admin   acetaminophen (TYLENOL) tablet 1,000 mg  1,000 mg Oral Q8H PRN Core, Doy Hutching, MD   1,000 mg at 01/27/24 0817   albuterol (PROVENTIL) (2.5 MG/3ML) 0.083% nebulizer solution 2.5 mg  2.5 mg Nebulization Q2H PRN Core, Doy Hutching, MD   2.5 mg at 01/27/24 1849   amiodarone (PACERONE) tablet 200 mg  200 mg Oral Daily Core, Doy Hutching, MD   200 mg at 01/29/24 1610   benzonatate  (TESSALON) capsule 200 mg  200 mg Oral TID PRN Kathrynn Running, MD   200 mg at 01/28/24 2021   Chlorhexidine Gluconate Cloth 2 % PADS 6 each  6 each Topical Q0600 Pola Corn, NP   6 each at 01/29/24 [phone]   cinacalcet (SENSIPAR) tablet 30 mg  30 mg Oral Q breakfast Core, Doy Hutching, MD   30 mg at 01/29/24 0827   diphenhydrAMINE (BENADRYL) capsule 25 mg  25 mg Oral BID PRN Lenny Pastel, PA-C   25 mg at 01/29/24 0116   feeding supplement (NEPRO CARB STEADY) liquid 237 mL  237 mL Oral BID BM Rai, Ripudeep K, MD   237 mL at 01/29/24 0824   heparin injection 5,000 Units  5,000 Units Subcutaneous Q8H Kathrynn Running, MD   5,000 Units at 01/29/24 5409   leptospermum manuka honey (MEDIHONEY) paste 1 Application  1 Application Topical Daily Core, Doy Hutching, MD   1 Application at 01/28/24 1600   melatonin tablet 3 mg  3 mg Oral QHS Core, Doy Hutching, MD   3 mg at 01/28/24 2244   midodrine (PROAMATINE) tablet 10 mg  10 mg Oral TID WC Core, Doy Hutching, MD   10 mg at 01/29/24 8119   multivitamin (RENA-VIT) tablet 1 tablet  1 tablet Oral QHS Core, Doy Hutching, MD   1 tablet at 01/28/24 2243   oxyCODONE (Oxy IR/ROXICODONE) immediate release tablet 5 mg  5 mg Oral Q4H PRN Howerter,  Justin B, DO   5 mg at 01/29/24 0440   phenol (CHLORASEPTIC) mouth spray 1 spray  1 spray Mouth/Throat PRN Kathrynn Running, MD   1 spray at 01/24/24 2126   sevelamer carbonate (RENVELA) tablet 2,400 mg  2,400 mg Oral TID WC Core, Doy Hutching, MD   2,400 mg at 01/29/24 1610   white petrolatum (VASELINE) gel   Topical PRN Kathrynn Running, MD   0.2 Application at 01/25/24 0346      Review of Systems: 10 systems reviewed and negative except per interval history/subjective  Physical Exam: Vitals:   01/29/24 0400 01/29/24 0822  BP: (!) 99/55 (!) 96/53  Pulse: (!) 102 (!) 109  Resp: (!) 21 18  Temp: 98.3 F (36.8 C) (!) 97.3 F (36.3 C)  SpO2: 98% 99%   No intake/output data recorded.  Intake/Output Summary (Last 24  hours) at 01/29/2024 0931 Last data filed at 01/28/2024 1356 Gross per 24 hour  Intake 240 ml  Output --  Net 240 ml    Constitutional: Chronically ill-appearing, sitting in bed, no distress ENMT: ears and nose without scars or lesions, MMM CV: normal rate, no lower extremity edema Respiratory: Bilateral chest rise with mild creased work of breathing Gastrointestinal: soft, non-tender, no palpable masses or hernias Skin: Wound covered, otherwise no visible lesions or rashes Psych: alert, judgement/insight appropriate, appropriate mood and affect   Test Results I personally reviewed new and old clinical labs and radiology tests Lab Results  Component Value Date   NA 135 01/27/2024   K 4.6 01/27/2024   CL 95 (L) 01/27/2024   CO2 25 01/27/2024   BUN 37 (H) 01/27/2024   CREATININE 3.78 (H) 01/27/2024   CALCIUM 8.7 (L) 01/27/2024   ALBUMIN 2.8 (L) 01/27/2024   PHOS 4.3 01/27/2024    CBC Recent Labs  Lab 01/23/24 0530 01/24/24 1033 01/25/24 0748 01/27/24 1101  WBC 9.1 6.9 10.8* 7.3  NEUTROABS 5.0  --   --   --   HGB 10.0* 9.0* 9.7* 10.1*  HCT 33.6* 29.8* 31.7* 34.1*  MCV 99.4 97.4 98.4 102.1*  PLT 268 258 249 212

## 2024-01-29 NOTE — Progress Notes (Signed)
Patient has been transferred for HD at 1305.  Hand off given to unit RN.

## 2024-01-29 NOTE — Progress Notes (Signed)
[facility]                                                                              [name], is a 43 y.o. female, DOB - 28-Jun-1981, ZOX:[id number] Admit date - 01/23/2024    Outpatient Primary MD for the patient is [name], NP  LOS - 6  days  Chief Complaint  Patient presents with   Shortness of Breath       Brief summary   Patient is a 43 year old female with ESRD on HD, CHF, SLE with recent admission to [facility] 12/2023 for volume overload, a flutter and influenza A.  She was discharged to SNF.   Apparently ultrafiltration was limited due to hypotension and atrial flutter.  At the SNF she was worsening dyspnea. On the day of admission she had acute and severe PND, EMS was called, found to be hypoxic with O2 sats of 73% on 3 L O2, HR 150.   In ED, BP 112/61, HR 106, RR 24 and 02 saturation 100% on supplemental 02.  On exam, was found to have volume overload with rales bilaterally, atrial fibrillation.  Nephrology was consulted for emergent HD.  Assessment & Plan    Principal Problem: Acute respiratory failure with hypoxia likely due to volume overload, acute on chronic systolic CHF exacerbation, ESRD -Underwent emergent HD, O2 sats 93 to 98% on 3 L.   -Improving, back to regular scheduled of hemodialysis, O2 sats stable on room air   Active Problems: ESRD on hemodialysis, TTS -Nephrology following, has received extra dialysis, -Now back on TTS schedule.  Plan for dialysis today.     Acute on chronic systolic CHF (congestive heart failure) (HCC), with volume overload -2D echo showed EF of 30 to 35% with global hypokinesis, RV systolic function with moderate reduction, LA with severe dilation, RA with moderate dilation, mild MS, mild MR (rheumatic), moderate to severe TR, aortic valve with moderate to severe AR. -Continue HD for volume management.  -Considering biventricular failure and valvular disease, overall prognosis poor.  Per cardiology  not likely to become a candidate for valve replacement.    Atrial fibrillation (HCC) with RVR -Continue rate control with amiodarone 200 mg daily -Not on anticoagulation due to high risk of bleeding, recent falls, history of SDH in the past. -Seen by cardiology this admission.  Toprol 12.5 mg daily on hold due to borderline BP. -BP still soft, continue midodrine 10 mg 3 times daily     Open right thigh wound, chronic, POA -Wound care following, -Appreciate Dr. Lajoyce Corners for evaluating the patient, recommended Vashe dressing changes daily.  Once wound is debrided back to healthy viable granulation tissue, should heal without surgical intervention.   Lupus (systemic lupus erythematosus) (HCC) -Stable, no acute flare, has history of lupus nephritis     Substance abuse (HCC) Patient denies any cocaine use since last year    Underweight, severe protein calorie malnutrition Estimated body mass index is 15.82 kg/m as calculated from the following:   Height as of this encounter: 5\' 3"  (1.6 m).   Weight as of this encounter: 40.5 kg.  Code Status: Full  CODE STATUS DVT Prophylaxis:  heparin injection 5,000 Units Start: 01/24/24 2200   Level of Care: Level of care: Telemetry Cardiac Family Communication: Updated patient's daughter, Bernita Buffy on phone on 1/31  Disposition Plan:      Remains inpatient appropriate:   During encounter, patient mentioned that she would like to go to a different facility, TOC updated.  Procedures:  2D echo HD  Consultants:   Nephrology Cardiology Orthopedics, Dr. Lajoyce Corners  Antimicrobials:   Anti-infectives (From admission, onward)    None          Medications  amiodarone  200 mg Oral Daily   Chlorhexidine Gluconate Cloth  6 each Topical Q0600   cinacalcet  30 mg Oral Q breakfast   feeding supplement (NEPRO CARB STEADY)  237 mL Oral BID BM   heparin  5,000 Units Subcutaneous Q8H   leptospermum manuka honey  1 Application Topical Daily   melatonin  3  mg Oral QHS   midodrine  10 mg Oral TID WC   multivitamin  1 tablet Oral QHS   sevelamer carbonate  2,400 mg Oral TID WC      Subjective:   Alsha Meland was seen and examined today.  BP soft, otherwise no acute complaints.   No nausea vomiting, chest pain or shortness of breath.   Objective:   Vitals:   01/29/24 0400 01/29/24 0444 01/29/24 0822 01/29/24 1219  BP: (!) 99/55  (!) 96/53 97/65  Pulse: (!) 102  (!) 109 (!) 103  Resp: (!) 21  18 17   Temp: 98.3 F (36.8 C)  (!) 97.3 F (36.3 C) (!) 97.5 F (36.4 C)  TempSrc: Oral  Oral Oral  SpO2: 98%  99% 97%  Weight:  40.5 kg    Height:        Intake/Output Summary (Last 24 hours) at 01/29/2024 1239 Last data filed at 01/28/2024 1356 Gross per 24 hour  Intake 240 ml  Output --  Net 240 ml     Wt Readings from Last 3 Encounters:  01/29/24 40.5 kg  12/18/23 57 kg  12/05/22 42.4 kg   Physical Exam General: Alert and oriented x 3, NAD Cardiovascular: S1 S2 clear, RRR.  Respiratory: CTAB, no wheezing Gastrointestinal: Soft, nontender, nondistended, NBS Ext: no pedal edema bilaterally Neuro: no new deficits Skin: Right thigh wound Psych: Normal affect    Data Reviewed:  I have personally reviewed following labs    CBC Lab Results  Component Value Date   WBC 7.3 01/27/2024   RBC 3.34 (L) 01/27/2024   HGB 10.1 (L) 01/27/2024   HCT 34.1 (L) 01/27/2024   MCV 102.1 (H) 01/27/2024   MCH 30.2 01/27/2024   PLT 212 01/27/2024   MCHC 29.6 (L) 01/27/2024   RDW 21.0 (H) 01/27/2024   LYMPHSABS 2.9 01/23/2024   MONOABS 1.1 (H) 01/23/2024   EOSABS 0.1 01/23/2024   BASOSABS 0.0 01/23/2024     Last metabolic panel Lab Results  Component Value Date   NA 135 01/27/2024   K 4.6 01/27/2024   CL 95 (L) 01/27/2024   CO2 25 01/27/2024   BUN 37 (H) 01/27/2024   CREATININE 3.78 (H) 01/27/2024   GLUCOSE 94 01/27/2024   GFRNONAA 15 (L) 01/27/2024   GFRAA 9 (L) 08/02/2020   CALCIUM 8.7 (L) 01/27/2024   PHOS 4.3  01/27/2024   PROT 7.6 12/13/2023   ALBUMIN 2.8 (L) 01/27/2024   BILITOT 2.7 (H) 12/13/2023   ALKPHOS 199 (H) 12/13/2023   AST 26 12/13/2023  ALT 27 12/13/2023   ANIONGAP 15 01/27/2024    CBG (last 3)  No results for input(s): "GLUCAP" in the last 72 hours.    Coagulation Profile: No results for input(s): "INR", "PROTIME" in the last 168 hours.   Radiology Studies: I have personally reviewed the imaging studies  No results found.     Thad Ranger M.D. [facility] 01/29/2024, 12:39 PM  Available via Epic secure chat 7am-7pm After 7 pm, please refer to night coverage provider listed on amion.

## 2024-01-29 NOTE — Progress Notes (Signed)
Patient received from HD, she is alert, oriented *4, V/S taken, CBG only 61mg /dl, encouraged pt to eat some simple carbs, Handoff given to Night shift RN  01/29/24 1907  Vitals  Temp 97.7 F (36.5 C)  Temp Source Oral  BP 98/65  MAP (mmHg) 75  BP Location Left Arm  BP Method Automatic  Patient Position (if appropriate) Lying  Pulse Rate (!) 43  Pulse Rate Source Monitor  ECG Heart Rate (!) 115  Resp 17  MEWS COLOR  MEWS Score Color Yellow  Oxygen Therapy  SpO2 (!) 86 %  O2 Device Room Air  MEWS Score  MEWS Temp 0  MEWS Systolic 1  MEWS Pulse 2  MEWS RR 0  MEWS LOC 0  MEWS Score 3

## 2024-01-29 NOTE — Progress Notes (Signed)
   01/29/24 1745  Vitals  Pulse Rate 87  Resp 16  BP (!) 106/50  SpO2 92 %  Post Treatment  Dialyzer Clearance Lightly streaked  Hemodialysis Intake (mL) 0 mL  Liters Processed 77.7  Fluid Removed (mL) 2200 mL  Tolerated HD Treatment Yes  AVG/AVF Arterial Site Held (minutes) 8 minutes  AVG/AVF Venous Site Held (minutes) 8 minutes   Received patient in bed to unit.  Alert and oriented.  Informed consent signed and in chart.   TX duration:3.75HRS  Patient tolerated well.  Transported back to the room  Alert, without acute distress.  Hand-off given to patient's nurse.   Access used: RAVF Access issues: NONE  Total UF removed: 2.2L Medication(s) given: BENEDRYL PAIN MEDS    [facility]

## 2024-01-29 NOTE — Plan of Care (Signed)
  Problem: Clinical Measurements: Goal: Diagnostic test results will improve Outcome: Progressing   Problem: Nutrition: Goal: Adequate nutrition will be maintained Outcome: Progressing   Problem: Pain Managment: Goal: General experience of comfort will improve and/or be controlled Outcome: Progressing   Problem: Safety: Goal: Ability to remain free from injury will improve Outcome: Progressing   Problem: Skin Integrity: Goal: Risk for impaired skin integrity will decrease Outcome: Progressing

## 2024-01-30 DIAGNOSIS — I4891 Unspecified atrial fibrillation: Secondary | ICD-10-CM | POA: Diagnosis not present

## 2024-01-30 DIAGNOSIS — N189 Chronic kidney disease, unspecified: Secondary | ICD-10-CM | POA: Diagnosis not present

## 2024-01-30 DIAGNOSIS — J810 Acute pulmonary edema: Secondary | ICD-10-CM | POA: Diagnosis not present

## 2024-01-30 DIAGNOSIS — N186 End stage renal disease: Secondary | ICD-10-CM | POA: Diagnosis not present

## 2024-01-30 MED ORDER — MIDODRINE HCL 10 MG PO TABS: 10.0000 mg | ORAL_TABLET | Freq: Three times a day (TID) | ORAL | 3 refills | Status: DC

## 2024-01-30 MED ORDER — OXYCODONE HCL 5 MG PO TABS: 5.0000 mg | ORAL_TABLET | Freq: Four times a day (QID) | ORAL | 0 refills | Status: DC | PRN

## 2024-01-30 MED ORDER — BENZONATATE 200 MG PO CAPS: 200.0000 mg | ORAL_CAPSULE | Freq: Three times a day (TID) | ORAL | 0 refills | Status: DC | PRN

## 2024-01-30 MED ORDER — MEDIHONEY WOUND/BURN DRESSING EX PSTE: 1.0000 | PASTE | Freq: Every day | CUTANEOUS | 3 refills | Status: DC

## 2024-01-30 MED ORDER — MELATONIN 3 MG PO TABS: 3.0000 mg | ORAL_TABLET | Freq: Every day | ORAL | 0 refills | Status: DC

## 2024-01-30 MED ORDER — AMIODARONE HCL 200 MG PO TABS: 200.0000 mg | ORAL_TABLET | Freq: Every day | ORAL | 3 refills | Status: DC

## 2024-01-30 MED ORDER — SEVELAMER CARBONATE 800 MG PO TABS: 2400.0000 mg | ORAL_TABLET | Freq: Three times a day (TID) | ORAL | 3 refills | Status: DC

## 2024-01-30 NOTE — Progress Notes (Signed)
[facility] Progress Note   Subjective: Seen sitting up in bed. Denies SOB. Net UF 2.2 liters  Objective Vitals:   01/29/24 1907 01/29/24 2306 01/30/24 0500 01/30/24 0800  BP: 98/65 (!) 99/55 104/63 (!) 92/55  Pulse: (!) 43 100 (!) 112 (!) 112  Resp: 17 20 16 20   Temp: 97.7 F (36.5 C) 97.6 F (36.4 C) 98.1 F (36.7 C) (!) 97.3 F (36.3 C)  TempSrc: Oral Oral Oral Oral  SpO2: 93%  95% 95%  Weight:      Height:       Physical Exam General:Chronically ill appearing female in NAD Heart:S1,S2 RRR No M/R/G Lungs: CTAB A/P slightly decreased in bases posteriorly Abdomen: flat, NT NABS Extremities: No LE edema Dialysis Access: R AVF + T/B  Additional Objective Labs: Basic Metabolic Panel: Recent Labs  Lab 01/26/24 0346 01/27/24 0252 01/29/24 1314  NA 135 135 135  K 4.4 4.6 3.9  CL 93* 95* 94*  CO2 25 25 22   GLUCOSE 67* 94 93  BUN 34* 37* 71*  CREATININE 3.72* 3.78* 6.64*  CALCIUM 8.8* 8.7* 8.2*  PHOS 4.4 4.3 5.8*   Liver Function Tests: Recent Labs  Lab 01/26/24 0346 01/27/24 0252 01/29/24 1314  ALBUMIN 3.0* 2.8* 3.2*   No results for input(s): "LIPASE", "AMYLASE" in the last 168 hours. CBC: Recent Labs  Lab 01/24/24 1033 01/25/24 0748 01/27/24 1101 01/29/24 1314  WBC 6.9 10.8* 7.3 7.8  HGB 9.0* 9.7* 10.1* 9.2*  HCT 29.8* 31.7* 34.1* 29.9*  MCV 97.4 98.4 102.1* 95.5  PLT 258 249 212 243   Blood Culture    Component Value Date/Time   SDES BLOOD LEFT ANTECUBITAL 01/23/2024 1309   SPECREQUEST  01/23/2024 1309    BOTTLES DRAWN AEROBIC AND ANAEROBIC Blood Culture results may not be optimal due to an inadequate volume of blood received in culture bottles   CULT  01/23/2024 1309    NO GROWTH 5 DAYS Performed at [facility], [address], [place], [place] [address]    REPTSTATUS 01/28/2024 FINAL 01/23/2024 1309    Cardiac Enzymes: No results for input(s): "CKTOTAL", "CKMB", "CKMBINDEX", "TROPONINI" in the last 168  hours. CBG: Recent Labs  Lab 01/29/24 1903  GLUCAP 61*   Iron Studies: No results for input(s): "IRON", "TIBC", "TRANSFERRIN", "FERRITIN" in the last 72 hours. @lablastinr3 @ Studies/Results: No results found. Medications:   amiodarone  200 mg Oral Daily   Chlorhexidine Gluconate Cloth  6 each Topical Q0600   cinacalcet  30 mg Oral Q breakfast   feeding supplement (NEPRO CARB STEADY)  237 mL Oral BID BM   heparin  5,000 Units Subcutaneous Q8H   leptospermum manuka honey  1 Application Topical Daily   melatonin  3 mg Oral QHS   midodrine  10 mg Oral TID WC   multivitamin  1 tablet Oral QHS   sevelamer carbonate  2,400 mg Oral TID WC     Assessment/Recommendations: [name] is a/an 43 y.o. female with a past medical history significant for ESRD, admitted for acute hypoxic respiratory failure.       [facility]   B400   45kg   2/2 bath  R AVF   Heparin none - last HD 1/25, post wt 46.2, gained 0.5kg during HD - hectorol 6 mcg tts - no esa, last hb 10.5   Assessment/ Plan: Acute hypoxic resp failure - w/ mild pulm edema on CXR. Suspect vol overload. However she is  having issues w/ atrial fib/ RVR and soft BP's limiting UF on HD. Cont with midodrine 10mg  TID.  Continue with ultrafiltration as tolerated ESRD - on HD TTS.  Has received extra dialysis.  Continue with aggressive ultrafiltration as tolerated.  Maintain TTS schedule at this time. Next HD 02/01/2024. BP - chronic hypotension on midodrine.  Anemia of esrd -hemoglobin around 9-10.  Consider ESA if needed. Secondary hyperparathyroidism - CCa and phos acceptable. Cont binders w/ meals. Afib w/ RVR: per primary and cards  H/o SLE Leg wound: Orthopedics has been involved.  Wound care involved    Adan Baehr H. Tabbatha Bordelon NP-C 01/30/2024, 8:47 AM  [employer] [phone]

## 2024-01-30 NOTE — TOC Transition Note (Addendum)
Transition of Care [facility]) - Discharge Note   Patient Details  Name: [name] MRN: [id number] Date of Birth: 1981/03/18  [facility] CM/SW Contact:  Ronny Bacon, RN Phone Number: 01/30/2024, 11:38 AM   Clinical Narrative:   Patient declined returning to facility and will discharge home to her mother's house. [facility] Referral # [id number] placed and contact information on AVS.  1250: spoke with patient by phone, Dialysis is less than 2 miles from her mom's house. she will use medicaid transportation to get to dialysis and wound care center. She has someone coming to visit around 5 or 6 today and they can be her ride home. Her dialysis days are T,Th,Sa and she will arrange transportation tomorrow to get to dialysis on Tuesday.   Final next level of care: Home/Self Care Barriers to Discharge: Continued Medical Work up   Patient Goals and CMS Choice            Discharge Placement                       Discharge Plan and Services Additional resources added to the After Visit Summary for   In-house Referral: Clinical Social Work                                   Social Drivers of Health (SDOH) Interventions SDOH Screenings   Food Insecurity: Patient Declined (01/25/2024)  Housing: Unknown (01/25/2024)  Transportation Needs: Patient Declined (01/25/2024)  Utilities: Patient Declined (01/25/2024)  Depression (PHQ2-9): Low Risk  (04/29/2022)  Tobacco Use: High Risk (12/13/2023)     Readmission Risk Interventions    04/17/2022    8:36 AM  Readmission Risk Prevention Plan  Transportation Screening Complete  PCP or Specialist Appt within 3-5 Days Complete  HRI or Home Care Consult Complete  Social Work Consult for Recovery Care Planning/Counseling Complete  Palliative Care Screening Not Applicable  Medication Review [employer]) Complete

## 2024-01-30 NOTE — Progress Notes (Deleted)
Patient is asymptomatic for hypotension, Informed to MD about her BP via secure chat, no any interventions needed as per MD.

## 2024-01-30 NOTE — Progress Notes (Signed)
Discharge instructions given to the patient, CCMD notified, IV removed, instructed to pick up meds, all the questions answered.

## 2024-01-30 NOTE — Progress Notes (Signed)
Patient is asymptomatic for hypotension, Informed to MD about her BP via secure chat, no any interventions needed as per MD.  01/30/24 1235  Vitals  BP (!) 90/43  MAP (mmHg) (!) 58  BP Location Left Arm  BP Method Automatic  Patient Position (if appropriate) Lying  MEWS COLOR  MEWS Score Color Yellow  MEWS Score  MEWS Temp 0  MEWS Systolic 1  MEWS Pulse 2  MEWS RR 0  MEWS LOC 0  MEWS Score 3

## 2024-01-30 NOTE — TOC Progression Note (Addendum)
Transition of Care [facility]) - Initial/Assessment Note    Patient Details  Name: [name] MRN: [id number] Date of Birth: 1981-02-09  Transition of Care [facility]) CM/SW Contact:    Ralene Bathe, LCSW Phone Number: 01/30/2024, 10:38 AM  Clinical Narrative:                 CSW received consult due to patient not wanting to return to SNF [facility]).    CSW contacted Whitney with central intake for [facility].  The facility can accept patient if LOG is approved.  CSW inquired as to whether any of the other facilities associated with the group could accept and was informed that none of the other facilities had a SNF bed available.  CSW spoke with patient.  CSW explained to patient that due to patient's insurance and the need for Letter of Guarantee from hospital for patient to be admitted to a SNF, [facility] is the facility that can accept.  CSW informed patient that central intake for [facility] was contacted and none of the other facilities have beds available at this time.  Patient expressed the desire to return home.  RN case Production designer, theatre/television/film and care team notified.    TOC will continue to follow.  Expected Discharge Plan: Skilled Nursing Facility Barriers to Discharge: Continued Medical Work up   Patient Goals and CMS Choice            Expected Discharge Plan and Services In-house Referral: Clinical Social Work     Living arrangements for the past 2 months: Skilled Nursing Facility                                      Prior Living Arrangements/Services Living arrangements for the past 2 months: Skilled Nursing Facility Lives with:: Facility Resident Patient language and need for interpreter reviewed:: No        Need for Family Participation in Patient Care: Yes (Comment) Care giver support system in place?: Yes (comment)   Criminal Activity/Legal Involvement Pertinent to Current Situation/Hospitalization: No - Comment as  needed  Activities of Daily Living      Permission Sought/Granted   Permission granted to share information with : Yes, Verbal Permission Granted  Share Information with NAME: Ashlon Lottman  Permission granted to share info w AGENCY: SNF  Permission granted to share info w Relationship: mother  Permission granted to share info w Contact Information: [phone]  Emotional Assessment Appearance:: Appears older than stated age Attitude/Demeanor/Rapport: Engaged Affect (typically observed): Appropriate Orientation: : Oriented to Self, Oriented to Place, Oriented to  Time, Oriented to Situation Alcohol / Substance Use: Not Applicable Psych Involvement: No (comment)  Admission diagnosis:  Acute respiratory failure (HCC) [J96.00] Acute pulmonary edema (HCC) [J81.0] Anemia associated with chronic renal failure [N18.9, D63.1] End-stage renal disease on hemodialysis (HCC) [N18.6, Z99.2] Acute on chronic respiratory failure with hypoxia (HCC) [J96.21] Atrial fibrillation, unspecified type (HCC) [I48.91] Patient Active Problem List   Diagnosis Date Noted   Protein-calorie malnutrition, severe 01/28/2024   Acute respiratory failure (HCC) 01/23/2024   Atrial flutter (HCC) 01/23/2024   Hypotension 01/23/2024   Secondary hypercoagulability disorder (HCC) 01/23/2024   Open thigh wound 01/23/2024   Thrombocytopenia (HCC) 12/14/2023   Atrial fibrillation with RVR (HCC) 12/14/2023   Acute on chronic heart failure (HCC) 12/06/2022   Seizures (HCC) 12/04/2022   R Subdural hematoma (HCC) 12/04/2022  Acute on chronic systolic CHF (congestive heart failure) (HCC) 11/30/2022   Nonrheumatic aortic valve insufficiency 11/30/2022   Nonrheumatic mitral valve regurgitation 11/30/2022   Non-ischemic cardiomyopathy (HCC) 11/30/2022   Acute febrile illness 04/12/2022   Hypertensive urgency 04/12/2022   GERD (gastroesophageal reflux disease) 04/12/2022   HFrEF (heart failure with reduced ejection  fraction) (HCC) 04/12/2022   Elevated troponin 04/12/2022   SIRS (systemic inflammatory response syndrome) (HCC) 04/12/2022   Hyperkalemia, diminished renal excretion 12/15/2021   Trichomonas vaginitis 03/25/2021   Bacterial vaginitis 03/25/2021   Encounter for screening for COVID-19 01/24/2021   Hemodialysis access, fistula mature (HCC) 01/22/2021   Allergic reaction 01/22/2021   Acute respiratory failure with hypoxia (HCC) 11/02/2020   Acute hyperkalemia 07/28/2020   Nicotine dependence, cigarettes, uncomplicated 07/28/2020   Essential hypertension 07/28/2020   Substance abuse (HCC) 07/28/2020   Hemodialysis catheter malfunction (HCC) 07/28/2020   Complication of AV dialysis fistula, sequela 07/28/2020   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HCC) 05/18/2020   Bacterial intestinal infection, unspecified 12/14/2019   Left shoulder pain 08/15/2019   Cellulitis, unspecified 05/02/2019   Hyperkalemia 07/29/2018   SVT (supraventricular tachycardia) (HCC) 07/16/2018   Cardiomyopathy, unspecified (HCC) 12/03/2016   Diarrhea 04/18/2016   Chronic combined systolic (congestive) and diastolic (congestive) heart failure (HCC) 03/16/2016   Other hypervolemia    S/P thoracentesis    Cough with hemoptysis    Myalgia    Pulmonary edema    ESRD (end stage renal disease) (HCC) 03/03/2016   Dependence on renal dialysis (HCC) 10/15/2015   Rash and nonspecific skin eruption 06/26/2015   Secondary Raynaud's phenomenon 06/24/2015   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   Fever, unspecified 08/28/2014   Pruritus, unspecified 08/28/2014   Headache, unspecified 04/30/2014   Pain, unspecified 04/30/2014   Cramping of hands 04/19/2014   Contraceptive management 04/19/2014   Mild protein-calorie malnutrition (HCC) 03/22/2014   Insomnia 03/15/2014   Benign hypertension with ESRD (end-stage renal disease) (HCC) 03/15/2014   Tobacco abuse 02/15/2014   Healthcare maintenance 02/15/2014   Iron deficiency anemia,  unspecified 02/12/2014   Coagulation defect, unspecified (HCC) 02/10/2014   Anemia due to chronic kidney disease 02/09/2014   Secondary hyperparathyroidism of renal origin (HCC) 02/09/2014   Hypoalbuminemia 02/01/2014   Nephrotic syndrome 02/01/2014   ESRD on dialysis (HCC) 01/31/2014   Pleural effusion, left 01/31/2014   Microcytic anemia 01/29/2014   Hypocalcemia 01/29/2014   Bacteremia 01/23/2013   Marijuana smoker (HCC) 01/18/2013   H/O pericarditis 01/17/2013   Lupus (systemic lupus erythematosus) (HCC) 01/17/2013   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   S/P pericardiocentesis 01/17/2013   H/O pleural effusion 01/17/2013   Nephrosis 01/17/2013   Preseptal cellulitis 01/17/2013   Cardiac tamponade 12/29/2004   Pericardial effusion (noninflammatory) 12/29/2004   PCP:  [name], NP Pharmacy:   [facility] [phone] - [place],  - [address] AT [facility] [address] [place] [address] Phone: [phone] Fax: [phone]     Social Drivers of Health (SDOH) Social History: SDOH Screenings   Food Insecurity: Patient Declined (01/25/2024)  Housing: Unknown (01/25/2024)  Transportation Needs: Patient Declined (01/25/2024)  Utilities: Patient Declined (01/25/2024)  Depression (PHQ2-9): Low Risk  (04/29/2022)  Tobacco Use: High Risk (12/13/2023)   SDOH Interventions:     Readmission Risk Interventions    04/17/2022    8:36 AM  Readmission Risk Prevention Plan  Transportation Screening Complete  PCP or Specialist Appt within 3-5 Days Complete  HRI or Home Care  Consult Complete  Social Work Consult for Recovery Care Planning/Counseling Complete  Palliative Care Screening Not Applicable  Medication Review Oceanographer) Complete

## 2024-01-30 NOTE — Discharge Summary (Signed)
Physician Discharge Summary   Patient: [name] MRN: [id number] DOB: Jan 21, 1981  Admit date:     01/23/2024  Discharge date: 01/30/24  Discharge Physician: Thad Ranger, MD    PCP: [name], NP   Recommendations at discharge:    Follow CBC, BMET outpatient Referral to wound care center sent as patient decided to discharge home.  Discharge Diagnoses:    ESRD (end stage renal disease) (HCC) on HD TTS   Atrial flutter with RVR (HCC)   Acute on chronic systolic CHF (congestive heart failure) (HCC)   Lupus (systemic lupus erythematosus) (HCC)   Open thigh wound, chronic POA    Chronic hypotension    Substance abuse (HCC)   Protein-calorie malnutrition, severe   Hospital Course:   Patient is a 43 year old female with ESRD on HD, CHF, SLE with recent admission to [facility] 12/2023 for volume overload, a flutter and influenza A.  She was discharged to SNF.   Apparently ultrafiltration was limited due to hypotension and atrial flutter.  At the SNF she was worsening dyspnea. On the day of admission she had acute and severe PND, EMS was called, found to be hypoxic with O2 sats of 73% on 3 L O2, HR 150.   In ED, BP 112/61, HR 106, RR 24 and 02 saturation 100% on supplemental 02.  On exam, was found to have volume overload with rales bilaterally, atrial fibrillation.  Nephrology was consulted for emergent HD.   Assessment and Plan:  Acute respiratory failure with hypoxia likely due to volume overload, acute on chronic systolic CHF exacerbation, ESRD -Underwent emergent HD, O2 sats 93 to 98% on 3 L.   -Improving, O2 sats stable on room air -Now back to her regular TTS schedule, next HD on 2//25    ESRD on hemodialysis, TTS -Nephrology following, has received extra dialysis, -Received HD on 2/1, back to her regular TTS schedule, next HD will be on 2/4. - patient will next dialysis at outpatient HD center, patient decided to go home, rather than back to SNF.  -TOC verified  that she will have transportation for wound care center and HD.        Acute on chronic systolic CHF (congestive heart failure) (HCC), with volume overload -2D echo showed EF of 30 to 35% with global hypokinesis, RV systolic function with moderate reduction, LA with severe dilation, RA with moderate dilation, mild MS, mild MR (rheumatic), moderate to severe TR, aortic valve with moderate to severe AR. -Continue HD for volume management.  -Considering biventricular failure and valvular disease, overall prognosis poor.  Per cardiology not likely to become a candidate for valve replacement.     Atrial fibrillation (HCC) with RVR -Continue rate control with amiodarone 200 mg daily -Not on anticoagulation due to high risk of bleeding, recent falls, history of SDH in the past. -Seen by cardiology this admission.  - Toprol 12.5 mg daily on hold due to borderline BP.   Chronic hypotension -Continue midodrine 10 mg 3 times daily   Open right thigh wound, chronic, POA -Wound care was consulted  -Appreciate Dr. Lajoyce Corners for evaluating the patient, recommended Vashe dressing changes daily.  Once wound is debrided back to healthy viable granulation tissue, should heal without surgical intervention. - Referral was sent to outpatient wound care center by TOC.     Lupus (systemic lupus erythematosus) (HCC) -Stable, no acute flare, has history of lupus nephritis     Substance abuse (HCC) Patient denies any cocaine use since  last year     Underweight, severe protein calorie malnutrition Estimated body mass index is 15.97 kg/m as calculated from the following:   Height as of this encounter: 5\' 3"  (1.6 m).   Weight as of this encounter: 40.9 kg.     Pain control - [employer] Controlled Substance Reporting System database was reviewed. and patient was instructed, not to drive, operate heavy machinery, perform activities at heights, swimming or participation in water activities or provide  baby-sitting services while on Pain, Sleep and Anxiety Medications; until their outpatient Physician has advised to do so again. Also recommended to not to take more than prescribed Pain, Sleep and Anxiety Medications.  Consultants: nephrology, cardiology, Ortho- Dr Lajoyce Corners  Procedures performed: HD   Disposition: Home Diet recommendation:  Discharge Diet Orders (From admission, onward)     Start     Ordered   01/30/24 0000  Diet general        01/30/24 1303            DISCHARGE MEDICATION: Allergies as of 01/30/2024       Reactions   Cephalosporins Rash   To both keflex and cefazolin.  Potential bullous lesion from Ceftriaxone 11/2023.   Tobramycin Sulfate Swelling   Eye swelling   Vancomycin Swelling        Medication List     PAUSE taking these medications    doxercalciferol 4 MCG/2ML injection Wait to take this until your doctor or other care provider tells you to start again. Commonly known as: HECTOROL Use as recommended by nephrology       STOP taking these medications    apixaban 2.5 MG Tabs tablet Commonly known as: ELIQUIS   Asperflex Max St 4 % Generic drug: lidocaine   Biofreeze Cool The Pain 4 % Gel Generic drug: Menthol (Topical Analgesic)       TAKE these medications    albuterol (2.5 MG/3ML) 0.083% nebulizer solution Commonly known as: PROVENTIL Take 3 mLs (2.5 mg total) by nebulization every 2 (two) hours as needed for wheezing or shortness of breath. What changed: when to take this   amiodarone 200 MG tablet Commonly known as: PACERONE Take 1 tablet (200 mg total) by mouth daily. Start taking on: January 31, 2024   benzonatate 200 MG capsule Commonly known as: TESSALON Take 1 capsule (200 mg total) by mouth 3 (three) times daily as needed for cough.   cinacalcet 30 MG tablet Commonly known as: SENSIPAR Take 30 mg by mouth every evening.   diphenhydrAMINE 25 MG tablet Commonly known as: BENADRYL Take 1 tablet (25 mg total) by  mouth every 4 (four) hours as needed for itching or allergies.   leptospermum manuka honey Pste paste Apply 1 Application topically daily. Start taking on: January 31, 2024   loperamide 2 MG capsule Commonly known as: IMODIUM Take 1 capsule (2 mg total) by mouth as needed for diarrhea or loose stools. What changed: when to take this   melatonin 3 MG Tabs tablet Take 1 tablet (3 mg total) by mouth at bedtime. Also available OTC. What changed: additional instructions   midodrine 10 MG tablet Commonly known as: PROAMATINE Take 1 tablet (10 mg total) by mouth 3 (three) times daily with meals. What changed:  medication strength how much to take   multivitamin Tabs tablet Take 1 tablet by mouth daily.   oxyCODONE 5 MG immediate release tablet Commonly known as: Oxy IR/ROXICODONE Take 1 tablet (5 mg total) by mouth every 6 (  six) hours as needed for moderate pain (pain score 4-6) or severe pain (pain score 7-10).   sevelamer carbonate 800 MG tablet Commonly known as: RENVELA Take 3 tablets (2,400 mg total) by mouth 3 (three) times daily. What changed: how much to take               Discharge Care Instructions  (From admission, onward)           Start     Ordered   01/30/24 0000  Discharge wound care:       Comments: Wound care daily: Cleanse thigh wound with Vashe, pat dry. Apply 1/4" thick layer of leptospermum honey to wound bed, top with saline moist gauze,top with dry dressing, change daily.  Cover left 5th finger wound with single layer of xeroform gauze, wrap with 1"conform gauze. Change daily   01/30/24 1303            Follow-up Information     Perlie Gold, PA-C Follow up.   Specialty: Cardiology Why: Thursday Feb 24, 2024 Arrive by 8:10 AMAppt at 8:25 AM (25 min) Contact information: [address] [place], [address] [place] [address] [phone]         [facility]             . Call in 1 day(s).   Why:  Call tomorrow to schedule appointment to follow up after hospital discharge. Contact information: [address] [place] [address] [phone]        [name], NP. Schedule an appointment as soon as possible for a visit in 2 week(s).   Specialty: Nurse Practitioner Why: for hospital follow-up Contact information: [address] [place] [address] [phone]         Nadara Mustard, MD. Schedule an appointment as soon as possible for a visit in 2 week(s).   Specialty: Orthopedic Surgery Why: for hospital follow-up Contact information: [address] [place] [address] [phone]                Discharge Exam: Ceasar Mons Weights   01/28/24 0528 01/29/24 0444 01/29/24 1321  Weight: 39.6 kg 40.5 kg 40.9 kg   S: wants to go home to her mother's residence which is close to her HD center. States she will have transportation to HD center and wound care center   BP (!) 90/43 (BP Location: Left Arm)   Pulse (!) 111   Temp (!) 97.3 F (36.3 C) (Oral)   Resp 15   Ht 5\' 3"  (1.6 m)   Wt 40.9 kg   SpO2 93%   BMI 15.97 kg/m   Physical Exam General: Alert and oriented x 3, NAD Cardiovascular: S1 S2 clear, RRR.  Respiratory: CTAB, no wheezing, rales or rhonchi Gastrointestinal: Soft, nontender, nondistended, NBS Ext: no pedal edema bilaterally Neuro: no new deficits Psych: Normal affect    Condition at discharge: fair  The results of significant diagnostics from this hospitalization (including imaging, microbiology, ancillary and laboratory) are listed below for reference.   Imaging Studies: DG Chest Port 1 View Result Date: 01/23/2024 CLINICAL DATA:  43 year old female with shortness of breath and hypoxia. EXAM: PORTABLE CHEST 1 VIEW COMPARISON:  Portable chest 01/15/2024, 12/22/2023. FINDINGS: Portable AP upright view at stable moderate to severe cardiomegaly, mediastinal contours. Continued lung base  hypo ventilation worse on the left side. Since last month the pulmonary vascularity has mildly increased. No pneumothorax or consolidation. Trace  fluid or thickening in the right minor fissure is unchanged. Overall ventilation has not significantly changed from 01/15/2024. Visualized tracheal air column is within normal limits. No acute osseous abnormality identified. Paucity of bowel gas. IMPRESSION: Cardiomegaly, small pleural effusions, and pulmonary edema not significantly changed since 01/15/2024. Electronically Signed   By: [name] M.D.   On: 01/23/2024 06:35    Microbiology: Results for orders placed or performed during the hospital encounter of 01/23/24  Culture, blood (Routine X 2) w Reflex to ID Panel     Status: None   Collection Time: 01/23/24  1:04 PM   Specimen: BLOOD LEFT WRIST  Result Value Ref Range Status   Specimen Description BLOOD LEFT WRIST  Final   Special Requests   Final    BOTTLES DRAWN AEROBIC AND ANAEROBIC Blood Culture results may not be optimal due to an inadequate volume of blood received in culture bottles   Culture   Final    NO GROWTH 5 DAYS Performed at [facility], [address], [place], [place] [address]    Report Status 01/28/2024 FINAL  Final  Culture, blood (Routine X 2) w Reflex to ID Panel     Status: None   Collection Time: 01/23/24  1:09 PM   Specimen: BLOOD  Result Value Ref Range Status   Specimen Description BLOOD LEFT ANTECUBITAL  Final   Special Requests   Final    BOTTLES DRAWN AEROBIC AND ANAEROBIC Blood Culture results may not be optimal due to an inadequate volume of blood received in culture bottles   Culture   Final    NO GROWTH 5 DAYS Performed at [facility], [address] [place], [place] [address]    Report Status 01/28/2024 FINAL  Final    Labs: CBC: Recent Labs  Lab 01/24/24 1033 01/25/24 0748 01/27/24 1101 01/29/24 1314  WBC 6.9 10.8* 7.3 7.8  HGB 9.0* 9.7* 10.1* 9.2*  HCT 29.8* 31.7* 34.1* 29.9*   MCV 97.4 98.4 102.1* 95.5  PLT 258 249 212 243   Basic Metabolic Panel: Recent Labs  Lab 01/25/24 0306 01/26/24 0346 01/27/24 0252 01/29/24 1314  NA 139 135 135 135  K 4.2 4.4 4.6 3.9  CL 100 93* 95* 94*  CO2 25 25 25 22   GLUCOSE 79 67* 94 93  BUN 44* 34* 37* 71*  CREATININE 4.55* 3.72* 3.78* 6.64*  CALCIUM 8.9 8.8* 8.7* 8.2*  PHOS  --  4.4 4.3 5.8*   Liver Function Tests: Recent Labs  Lab 01/26/24 0346 01/27/24 0252 01/29/24 1314  ALBUMIN 3.0* 2.8* 3.2*   CBG: Recent Labs  Lab 01/29/24 1903  GLUCAP 61*    Discharge time spent: greater than 30 minutes.  Signed: Thad Ranger, MD [facility] 01/30/2024

## 2024-01-30 NOTE — Discharge Planning (Signed)
[facility] Patient Discharge Orders- [facility] CLINIC: [place]  Patient's name: [name] Admit/DC Dates: 01/23/2024 - 01/30/2024  Discharge Diagnoses: Acute respiratory failure with hypoxia likely due to volume overload, acute on chronic systolic CHF exacerbation,    AFib RVR  Aranesp: Given: NO   Date and amount of last dose: NA  Last Hgb: 9.2 PRBC's Given: NA Date/# of units: NA ESA dose for discharge: mircera 30 mcg IV q 2 weeks  IV Iron dose at discharge: Per protocol  Heparin change: No  EDW Change: Yes New EDW: 41.5 kg  Bath Change: No  Access intervention/Change: No Details:  Hectorol/Calcitriol change: No  Discharge Labs: Calcium 8.2 Phosphorus 5.8 Albumin 3.2 K+ 3.9  IV Antibiotics: No Details:  On Coumadin?: No Last INR: Next INR: Managed By:   OTHER/APPTS/LAB ORDERS: Draw monthly labs on arrival    D/C Meds to be reconciled by nurse after every discharge.  Completed By: Alonna Buckler [facility] [phone]   Reviewed by: MD:______ RN_______

## 2024-01-30 NOTE — Plan of Care (Signed)
  Problem: Clinical Measurements: Goal: Will remain free from infection Outcome: Progressing   Problem: Clinical Measurements: Goal: Diagnostic test results will improve Outcome: Progressing   Problem: Coping: Goal: Level of anxiety will decrease Outcome: Progressing   Problem: Safety: Goal: Ability to remain free from injury will improve Outcome: Progressing   Problem: Skin Integrity: Goal: Risk for impaired skin integrity will decrease Outcome: Progressing

## 2024-01-30 NOTE — Progress Notes (Signed)
[facility]                                                                              [name], is a 43 y.o. female, DOB - 11-19-1981, VHQ:[id number] Admit date - 01/23/2024    Outpatient Primary MD for the patient is [name] Fendt, NP  LOS - 7  days  Chief Complaint  Patient presents with   Shortness of Breath       Brief summary   Patient is a 43 year old female with ESRD on HD, CHF, SLE with recent admission to [facility] 12/2023 for volume overload, a flutter and influenza A.  She was discharged to SNF.   Apparently ultrafiltration was limited due to hypotension and atrial flutter.  At the SNF she was worsening dyspnea. On the day of admission she had acute and severe PND, EMS was called, found to be hypoxic with O2 sats of 73% on 3 L O2, HR 150.   In ED, BP 112/61, HR 106, RR 24 and 02 saturation 100% on supplemental 02.  On exam, was found to have volume overload with rales bilaterally, atrial fibrillation.  Nephrology was consulted for emergent HD.  Assessment & Plan    Principal Problem: Acute respiratory failure with hypoxia likely due to volume overload, acute on chronic systolic CHF exacerbation, ESRD -Underwent emergent HD, O2 sats 93 to 98% on 3 L.   -Improving, O2 sats stable on room air -Now back to her regular TTS schedule, next HD on 2//25  Active Problems: ESRD on hemodialysis, TTS -Nephrology following, has received extra dialysis, -Received HD on 2/1, back to her regular TTS schedule, next HD will be on 2/4.     Acute on chronic systolic CHF (congestive heart failure) (HCC), with volume overload -2D echo showed EF of 30 to 35% with global hypokinesis, RV systolic function with moderate reduction, LA with severe dilation, RA with moderate dilation, mild MS, mild MR (rheumatic), moderate to severe TR, aortic valve with moderate to severe AR. -Continue HD for volume management.  -Considering biventricular failure and valvular disease,  overall prognosis poor.  Per cardiology not likely to become a candidate for valve replacement.    Atrial fibrillation (HCC) with RVR -Continue rate control with amiodarone 200 mg daily -Not on anticoagulation due to high risk of bleeding, recent falls, history of SDH in the past. -Seen by cardiology this admission.  - Toprol 12.5 mg daily on hold due to borderline BP.  Chronic hypotension -Continue midodrine 10 mg 3 times daily  Open right thigh wound, chronic, POA -Wound care following, -Appreciate Dr. Lajoyce Corners for evaluating the patient, recommended Vashe dressing changes daily.  Once wound is debrided back to healthy viable granulation tissue, should heal without surgical intervention.   Lupus (systemic lupus erythematosus) (HCC) -Stable, no acute flare, has history of lupus nephritis     Substance abuse (HCC) Patient denies any cocaine use since last year    Underweight, severe protein calorie malnutrition Estimated body mass index is 15.97 kg/m as calculated from the following:   Height as of this encounter: 5\' 3"  (1.6 m).   Weight as of  this encounter: 40.9 kg.  Code Status: Full CODE STATUS DVT Prophylaxis:  heparin injection 5,000 Units Start: 01/24/24 2200   Level of Care: Level of care: Telemetry Cardiac Family Communication: Updated patient's daughter, Bernita Buffy on phone on 1/31  Disposition Plan:      Remains inpatient appropriate:   During encounter 2/1, patient mentioned that she would like to go to a different facility, TOC updated.  Patient should be medically ready for discharge tomorrow, unclear disposition.   Procedures:  2D echo HD  Consultants:   Nephrology Cardiology Orthopedics, Dr. Lajoyce Corners  Antimicrobials:   Anti-infectives (From admission, onward)    None          Medications  amiodarone  200 mg Oral Daily   Chlorhexidine Gluconate Cloth  6 each Topical Q0600   cinacalcet  30 mg Oral Q breakfast   feeding supplement (NEPRO CARB STEADY)   237 mL Oral BID BM   heparin  5,000 Units Subcutaneous Q8H   leptospermum manuka honey  1 Application Topical Daily   melatonin  3 mg Oral QHS   midodrine  10 mg Oral TID WC   multivitamin  1 tablet Oral QHS   sevelamer carbonate  2,400 mg Oral TID WC      Subjective:   [name] was seen and examined today.  No acute complaints.  No nausea vomiting, chest pain, shortness of breath, fevers.  States she is eating regular diet with snacks.     Objective:   Vitals:   01/29/24 1907 01/29/24 2306 01/30/24 0500 01/30/24 0800  BP: 98/65 (!) 99/55 104/63 (!) 92/55  Pulse: (!) 43 100 (!) 112 (!) 112  Resp: 17 20 16 20   Temp: 97.7 F (36.5 C) 97.6 F (36.4 C) 98.1 F (36.7 C) (!) 97.3 F (36.3 C)  TempSrc: Oral Oral Oral Oral  SpO2: 93%  95% 95%  Weight:      Height:        Intake/Output Summary (Last 24 hours) at 01/30/2024 1013 Last data filed at 01/29/2024 1745 Gross per 24 hour  Intake --  Output 2200 ml  Net -2200 ml     Wt Readings from Last 3 Encounters:  01/29/24 40.9 kg  12/18/23 57 kg  12/05/22 42.4 kg    Physical Exam General: Alert and oriented x 3, NAD Cardiovascular: S1 S2 clear, RRR.  Respiratory: CTAB Gastrointestinal: Soft, nontender, nondistended, NBS Ext: no pedal edema bilaterally Neuro: no new deficits Skin: Upper right thigh wound, dressing intact Psych: Normal affect     Data Reviewed:  I have personally reviewed following labs    CBC Lab Results  Component Value Date   WBC 7.8 01/29/2024   RBC 3.13 (L) 01/29/2024   HGB 9.2 (L) 01/29/2024   HCT 29.9 (L) 01/29/2024   MCV 95.5 01/29/2024   MCH 29.4 01/29/2024   PLT 243 01/29/2024   MCHC 30.8 01/29/2024   RDW 20.6 (H) 01/29/2024   LYMPHSABS 2.9 01/23/2024   MONOABS 1.1 (H) 01/23/2024   EOSABS 0.1 01/23/2024   BASOSABS 0.0 01/23/2024     Last metabolic panel Lab Results  Component Value Date   NA 135 01/29/2024   K 3.9 01/29/2024   CL 94 (L) 01/29/2024   CO2 22  01/29/2024   BUN 71 (H) 01/29/2024   CREATININE 6.64 (H) 01/29/2024   GLUCOSE 93 01/29/2024   GFRNONAA 8 (L) 01/29/2024   GFRAA 9 (L) 08/02/2020   CALCIUM 8.2 (L) 01/29/2024   PHOS 5.8 (H)  01/29/2024   PROT 7.6 12/13/2023   ALBUMIN 3.2 (L) 01/29/2024   BILITOT 2.7 (H) 12/13/2023   ALKPHOS 199 (H) 12/13/2023   AST 26 12/13/2023   ALT 27 12/13/2023   ANIONGAP 19 (H) 01/29/2024    CBG (last 3)  Recent Labs    01/29/24 1903  GLUCAP 61*      Coagulation Profile: No results for input(s): "INR", "PROTIME" in the last 168 hours.   Radiology Studies: I have personally reviewed the imaging studies  No results found.     Thad Ranger M.D. [facility] 01/30/2024, 10:13 AM  Available via Epic secure chat 7am-7pm After 7 pm, please refer to night coverage provider listed on amion.

## 2024-01-31 ENCOUNTER — Telehealth: Payer: Self-pay

## 2024-01-31 NOTE — Progress Notes (Signed)
Late Note Entry- Jan 31, 2024  Pt was d/c to home yesterday. Contacted [facility] this morning to be advised of pt's d/c date and that pt should resume care tomorrow.   Olivia Canter Renal Navigator [phone]

## 2024-01-31 NOTE — Transitions of Care (Post Inpatient/ED Visit) (Signed)
   01/31/2024  Name: [name] MRN: [id number] DOB: 03-14-1981  Today's TOC FU Call Status: Today's TOC FU Call Status:: Unsuccessful Call (1st Attempt) Unsuccessful Call (1st Attempt) Date: 01/31/24  Attempted to reach the patient regarding the most recent Inpatient/ED visit.  Follow Up Plan: Additional outreach attempts will be made to reach the patient to complete the Transitions of Care (Post Inpatient/ED visit) call.   Alyse Low, RN, BA, [facility], CRRN [facility] Coordinator, Transition of Care Ph # [phone]

## 2024-01-31 NOTE — TOC Transition Note (Signed)
Transition of Care - Initial Contact from Inpatient Facility  Date of discharge: 01/30/24 Date of contact: 02/02/24 Method: Phone Spoke to: Patient  Patient contacted to discuss transition of care from recent inpatient hospitalization. Patient was admitted to [facility] from 01/23/24-01/30/24 with discharge diagnosis of acute on chronic systolic CHF (2nd volume overload) and Afib with RVR.  Patient reports going to the pharmacy today and picking up her medications.  Patient will return to her outpatient HD unit tomorrow at [facility].  Salome Holmes, NP

## 2024-02-01 ENCOUNTER — Telehealth: Payer: Self-pay

## 2024-02-01 NOTE — Transitions of Care (Post Inpatient/ED Visit) (Signed)
   02/01/2024  Name: [name] MRN: [id number] DOB: 1981-08-04  Today's TOC FU Call Status: Today's TOC FU Call Status:: Unsuccessful Call (2nd Attempt) Unsuccessful Call (2nd Attempt) Date: 02/01/24  Attempted to reach the patient regarding the most recent Inpatient/ED visit.  Follow Up Plan: Additional outreach attempts will be made to reach the patient to complete the Transitions of Care (Post Inpatient/ED visit) call.   Channing Larry, RN, BA, [facility], CRRN [facility] Coordinator, Transition of Care Ph # [phone]

## 2024-02-02 ENCOUNTER — Telehealth: Payer: Self-pay

## 2024-02-02 NOTE — Transitions of Care (Post Inpatient/ED Visit) (Signed)
   02/02/2024  Name: [name] MRN: [id number] DOB: 1981-07-04  Today's TOC FU Call Status: Today's TOC FU Call Status:: Successful TOC FU Call Completed TOC FU Call Complete Date: 02/02/24 (partial completion due to patient ending call abruptly) Patient's Name and Date of Birth confirmed.  Transition Care Management Follow-up Telephone Call Date of Discharge: 01/30/24 Discharge Facility: [facility]) Type of Discharge: Inpatient Admission Primary Inpatient Discharge Diagnosis:: ESRD, Acute pulmonary edema How have you been since you were released from the hospital?: Worse Any questions or concerns?: Yes Patient Questions/Concerns:: States has open thigh wound that smells bad and needs to be looked at - called Wound Clinic and couldn't be seen until March, advised patient to call PCP (patient stated she did, but couldn't get through), or seek medical assistance at a [facility] or the Emergency Department as a last resort as patient adamantly stated she did not want to go back to the hospital  Items Reviewed: Did you receive and understand the discharge instructions provided?: Yes Medications obtained,verified, and reconciled?: No Medications Not Reviewed Reasons:: Other: (patient did not want to discuss meds, patient's primary focus was on obtaining an appointment as soon as possible for someone to look at her R thigh open wound to avoid going to ED) Any new allergies since your discharge?: No Dietary orders reviewed?: No Do you have support at home?: Yes People in Home: parent(s) Name of Support/Comfort Primary Source: staying with her mother  Medications Reviewed Today: Medications Reviewed Today   Medications were not reviewed in this encounter     Home Care and Equipment/Supplies: Were Home Health Services Ordered?: No Any new equipment or medical supplies ordered?: No  Functional Questionnaire: Do you need assistance with bathing/showering or dressing?:  No Do you need assistance with meal preparation?: No Do you need assistance with eating?: No Do you have difficulty maintaining continence: No Do you need assistance with getting out of bed/getting out of a chair/moving?: No Do you have difficulty managing or taking your medications?: No  Follow up appointments reviewed: PCP Follow-up appointment confirmed?: Yes Date of PCP follow-up appointment?: 02/21/24 Follow-up Provider: Greig Drones NP Specialist Hospital Follow-up appointment confirmed?: Yes Date of Specialist follow-up appointment?: 02/24/24 Follow-Up Specialty Provider:: Dr. Artist Pouch CVD Do you need transportation to your follow-up appointment?: No Do you understand care options if your condition(s) worsen?: Yes-patient verbalized understanding  Referral to Wound Care Center sent as patient deciding to discharge home per Hospitalist's 01/30/24 Discharge Summary.   Patient stated the earliest appointment she was able to obtain from the Wound Care Center was in mid March/2025.  After speaking with patient, I called the patient's PCP office to alert patient's care team of patient's urgent need to have Right open thigh wound seen as soon as possible. Spoke to [place] who stated she will make PCP Amy Drones aware of 02/02/24 TOC FU encounter and [facility] RNCM referring patient to an Urgent Care Center or Emergency Department.  Channing Larry, RN, BA, [facility], CRRN [facility] Coordinator, Transition of Care Ph # [phone]

## 2024-02-03 ENCOUNTER — Other Ambulatory Visit: Payer: Self-pay | Admitting: Family

## 2024-02-03 DIAGNOSIS — T148XXA Other injury of unspecified body region, initial encounter: Secondary | ICD-10-CM

## 2024-02-03 NOTE — Telephone Encounter (Signed)
 Copied from CRM [id number]. Topic: Clinical - Medical Advice >> Feb 02, 2024 11:48 AM Myrick T wrote: Reason for CRM: [facility] nurse Channing called to see if someone can call the wound center and get patient seen sooner than March. Patient needs daily dressings to her right leg. Nurse had given her a few options as patient left the hospital against medical advice. Patient refuses to go to ER nor any UC. Please f/u with [facility]

## 2024-02-03 NOTE — Telephone Encounter (Signed)
 Will forward to correct provider and office

## 2024-02-03 NOTE — Telephone Encounter (Signed)
 Patients mailbox is full

## 2024-02-03 NOTE — Telephone Encounter (Signed)
 Referral to Pain Clinic (Wound). Expect call soon with appointment details.

## 2024-02-05 ENCOUNTER — Ambulatory Visit (HOSPITAL_COMMUNITY): Payer: Medicaid Other

## 2024-02-21 ENCOUNTER — Ambulatory Visit: Payer: Medicaid Other | Admitting: Orthopedic Surgery

## 2024-02-21 ENCOUNTER — Encounter: Payer: Medicaid Other | Admitting: Family

## 2024-02-21 NOTE — Progress Notes (Signed)
 Erroneous encounter-disregard

## 2024-02-24 ENCOUNTER — Ambulatory Visit: Payer: Medicaid Other | Attending: Cardiology | Admitting: Cardiology

## 2024-02-24 NOTE — Progress Notes (Deleted)
  Cardiology Office Note:   Date:  02/24/2024  ID:  [name], DOB Jan 21, 1981, MRN [id number] PCP: [name], NP  [facility] Providers Cardiologist:  Jodelle Red, MD { Click to update primary MD,subspecialty MD or APP then REFRESH:1}   History of Present Illness:   [name] is a 43 y.o. female ***  Discussed the use of AI scribe software for clinical note transcription with the patient, who gave verbal consent to proceed.  History of Present Illness             Today patient denies chest pain, shortness of breath, lower extremity edema, fatigue, palpitations, melena, hematuria, hemoptysis, diaphoresis, weakness, presyncope, syncope, orthopnea, and PND.   Studies Reviewed:    EKG:        ***  Risk Assessment/Calculations:   {Does this patient have ATRIAL FIBRILLATION?:[id number]} No BP recorded.  {Refresh Note OR Click here to enter BP  :1}***        Physical Exam:   VS:  There were no vitals taken for this visit.   Wt Readings from Last 3 Encounters:  01/29/24 90 lb 2.7 oz (40.9 kg)  12/18/23 125 lb 10.6 oz (57 kg)  12/05/22 93 lb 7.6 oz (42.4 kg)     Physical Exam  Physical Exam           ASSESSMENT AND PLAN:     Assessment and Plan                  {Are you ordering a CV Procedure (e.g. stress test, cath, DCCV, TEE, etc)?   Press F2        :[id number]}   Signed, Perlie Gold, PA-C

## 2024-03-17 ENCOUNTER — Encounter (HOSPITAL_BASED_OUTPATIENT_CLINIC_OR_DEPARTMENT_OTHER): Payer: Medicaid Other | Attending: Internal Medicine | Admitting: Internal Medicine

## 2024-03-17 DIAGNOSIS — N186 End stage renal disease: Secondary | ICD-10-CM | POA: Diagnosis not present

## 2024-03-17 DIAGNOSIS — L97112 Non-pressure chronic ulcer of right thigh with fat layer exposed: Secondary | ICD-10-CM | POA: Diagnosis not present

## 2024-03-17 DIAGNOSIS — I5042 Chronic combined systolic (congestive) and diastolic (congestive) heart failure: Secondary | ICD-10-CM | POA: Insufficient documentation

## 2024-03-24 ENCOUNTER — Ambulatory Visit (HOSPITAL_BASED_OUTPATIENT_CLINIC_OR_DEPARTMENT_OTHER): Admitting: Internal Medicine

## 2024-04-17 ENCOUNTER — Emergency Department (HOSPITAL_COMMUNITY)

## 2024-04-17 ENCOUNTER — Encounter (HOSPITAL_COMMUNITY): Payer: Self-pay | Admitting: Emergency Medicine

## 2024-04-17 ENCOUNTER — Other Ambulatory Visit: Payer: Self-pay

## 2024-04-17 ENCOUNTER — Inpatient Hospital Stay (HOSPITAL_COMMUNITY)
Admission: EM | Admit: 2024-04-17 | Discharge: 2024-04-21 | DRG: 291 | Disposition: A | Discharge status: Home / Self Care | Attending: Internal Medicine | Admitting: Internal Medicine

## 2024-04-17 DIAGNOSIS — Z22322 Carrier or suspected carrier of Methicillin resistant Staphylococcus aureus: Secondary | ICD-10-CM

## 2024-04-17 DIAGNOSIS — F1910 Other psychoactive substance abuse, uncomplicated: Secondary | ICD-10-CM | POA: Diagnosis present

## 2024-04-17 DIAGNOSIS — Z91148 Patient's other noncompliance with medication regimen for other reason: Secondary | ICD-10-CM

## 2024-04-17 DIAGNOSIS — I083 Combined rheumatic disorders of mitral, aortic and tricuspid valves: Secondary | ICD-10-CM | POA: Diagnosis present

## 2024-04-17 DIAGNOSIS — R7989 Other specified abnormal findings of blood chemistry: Secondary | ICD-10-CM

## 2024-04-17 DIAGNOSIS — Z881 Allergy status to other antibiotic agents status: Secondary | ICD-10-CM

## 2024-04-17 DIAGNOSIS — I351 Nonrheumatic aortic (valve) insufficiency: Secondary | ICD-10-CM

## 2024-04-17 DIAGNOSIS — Z992 Dependence on renal dialysis: Secondary | ICD-10-CM

## 2024-04-17 DIAGNOSIS — R0602 Shortness of breath: Secondary | ICD-10-CM | POA: Diagnosis present

## 2024-04-17 DIAGNOSIS — L97119 Non-pressure chronic ulcer of right thigh with unspecified severity: Secondary | ICD-10-CM | POA: Diagnosis present

## 2024-04-17 DIAGNOSIS — Z681 Body mass index (BMI) 19 or less, adult: Secondary | ICD-10-CM

## 2024-04-17 DIAGNOSIS — J9610 Chronic respiratory failure, unspecified whether with hypoxia or hypercapnia: Secondary | ICD-10-CM

## 2024-04-17 DIAGNOSIS — N186 End stage renal disease: Secondary | ICD-10-CM | POA: Diagnosis present

## 2024-04-17 DIAGNOSIS — N2581 Secondary hyperparathyroidism of renal origin: Secondary | ICD-10-CM | POA: Diagnosis present

## 2024-04-17 DIAGNOSIS — E875 Hyperkalemia: Secondary | ICD-10-CM | POA: Diagnosis present

## 2024-04-17 DIAGNOSIS — I4892 Unspecified atrial flutter: Principal | ICD-10-CM

## 2024-04-17 DIAGNOSIS — M898X9 Other specified disorders of bone, unspecified site: Secondary | ICD-10-CM | POA: Diagnosis present

## 2024-04-17 DIAGNOSIS — I132 Hypertensive heart and chronic kidney disease with heart failure and with stage 5 chronic kidney disease, or end stage renal disease: Secondary | ICD-10-CM | POA: Diagnosis present

## 2024-04-17 DIAGNOSIS — E43 Unspecified severe protein-calorie malnutrition: Secondary | ICD-10-CM | POA: Diagnosis present

## 2024-04-17 DIAGNOSIS — D6869 Other thrombophilia: Secondary | ICD-10-CM | POA: Diagnosis present

## 2024-04-17 DIAGNOSIS — Z9181 History of falling: Secondary | ICD-10-CM

## 2024-04-17 DIAGNOSIS — J9601 Acute respiratory failure with hypoxia: Secondary | ICD-10-CM | POA: Diagnosis present

## 2024-04-17 DIAGNOSIS — I4891 Unspecified atrial fibrillation: Secondary | ICD-10-CM | POA: Diagnosis present

## 2024-04-17 DIAGNOSIS — Z1152 Encounter for screening for COVID-19: Secondary | ICD-10-CM

## 2024-04-17 DIAGNOSIS — I428 Other cardiomyopathies: Secondary | ICD-10-CM | POA: Diagnosis present

## 2024-04-17 DIAGNOSIS — I953 Hypotension of hemodialysis: Secondary | ICD-10-CM | POA: Diagnosis not present

## 2024-04-17 DIAGNOSIS — I340 Nonrheumatic mitral (valve) insufficiency: Secondary | ICD-10-CM

## 2024-04-17 DIAGNOSIS — I5043 Acute on chronic combined systolic (congestive) and diastolic (congestive) heart failure: Secondary | ICD-10-CM | POA: Diagnosis present

## 2024-04-17 DIAGNOSIS — M3214 Glomerular disease in systemic lupus erythematosus: Secondary | ICD-10-CM | POA: Diagnosis present

## 2024-04-17 DIAGNOSIS — I342 Nonrheumatic mitral (valve) stenosis: Secondary | ICD-10-CM | POA: Diagnosis not present

## 2024-04-17 DIAGNOSIS — I5023 Acute on chronic systolic (congestive) heart failure: Secondary | ICD-10-CM | POA: Diagnosis not present

## 2024-04-17 DIAGNOSIS — I484 Atypical atrial flutter: Secondary | ICD-10-CM | POA: Diagnosis present

## 2024-04-17 DIAGNOSIS — D631 Anemia in chronic kidney disease: Secondary | ICD-10-CM | POA: Diagnosis present

## 2024-04-17 DIAGNOSIS — I7300 Raynaud's syndrome without gangrene: Secondary | ICD-10-CM | POA: Diagnosis present

## 2024-04-17 DIAGNOSIS — F17210 Nicotine dependence, cigarettes, uncomplicated: Secondary | ICD-10-CM | POA: Diagnosis present

## 2024-04-17 DIAGNOSIS — E162 Hypoglycemia, unspecified: Secondary | ICD-10-CM

## 2024-04-17 DIAGNOSIS — M329 Systemic lupus erythematosus, unspecified: Secondary | ICD-10-CM | POA: Diagnosis present

## 2024-04-17 DIAGNOSIS — R911 Solitary pulmonary nodule: Secondary | ICD-10-CM | POA: Diagnosis present

## 2024-04-17 DIAGNOSIS — I361 Nonrheumatic tricuspid (valve) insufficiency: Secondary | ICD-10-CM | POA: Diagnosis not present

## 2024-04-17 DIAGNOSIS — I5033 Acute on chronic diastolic (congestive) heart failure: Secondary | ICD-10-CM | POA: Diagnosis not present

## 2024-04-17 DIAGNOSIS — E871 Hypo-osmolality and hyponatremia: Secondary | ICD-10-CM | POA: Diagnosis present

## 2024-04-17 DIAGNOSIS — I2489 Other forms of acute ischemic heart disease: Secondary | ICD-10-CM | POA: Diagnosis present

## 2024-04-17 DIAGNOSIS — J810 Acute pulmonary edema: Secondary | ICD-10-CM

## 2024-04-17 DIAGNOSIS — Z79899 Other long term (current) drug therapy: Secondary | ICD-10-CM

## 2024-04-17 DIAGNOSIS — Z716 Tobacco abuse counseling: Secondary | ICD-10-CM

## 2024-04-17 LAB — HEPATIC FUNCTION PANEL
ALT: 23 U/L (ref 0–44)
AST: 39 U/L (ref 15–41)
Albumin: 2.6 g/dL — ABNORMAL LOW (ref 3.5–5.0)
Alkaline Phosphatase: 352 U/L — ABNORMAL HIGH (ref 38–126)
Bilirubin, Direct: 0.5 mg/dL — ABNORMAL HIGH (ref 0.0–0.2)
Indirect Bilirubin: 0.8 mg/dL (ref 0.3–0.9)
Total Bilirubin: 1.3 mg/dL — ABNORMAL HIGH (ref 0.0–1.2)
Total Protein: 8.1 g/dL (ref 6.5–8.1)

## 2024-04-17 LAB — CBC WITH DIFFERENTIAL/PLATELET
Abs Immature Granulocytes: 0.02 10*3/uL (ref 0.00–0.07)
Basophils Absolute: 0 10*3/uL (ref 0.0–0.1)
Basophils Relative: 0 %
Eosinophils Absolute: 0 10*3/uL (ref 0.0–0.5)
Eosinophils Relative: 0 %
HCT: 38.5 % (ref 36.0–46.0)
Hemoglobin: 12.7 g/dL (ref 12.0–15.0)
Immature Granulocytes: 0 %
Lymphocytes Relative: 37 %
Lymphs Abs: 1.9 10*3/uL (ref 0.7–4.0)
MCH: 27.8 pg (ref 26.0–34.0)
MCHC: 33 g/dL (ref 30.0–36.0)
MCV: 84.2 fL (ref 80.0–100.0)
Monocytes Absolute: 0.9 10*3/uL (ref 0.1–1.0)
Monocytes Relative: 17 %
Neutro Abs: 2.3 10*3/uL (ref 1.7–7.7)
Neutrophils Relative %: 46 %
Platelets: 175 10*3/uL (ref 150–400)
RBC: 4.57 MIL/uL (ref 3.87–5.11)
RDW: 15 % (ref 11.5–15.5)
WBC: 5.2 10*3/uL (ref 4.0–10.5)
nRBC: 0.4 % — ABNORMAL HIGH (ref 0.0–0.2)

## 2024-04-17 LAB — CBG MONITORING, ED
Glucose-Capillary: 71 mg/dL (ref 70–99)
Glucose-Capillary: 67 mg/dL — ABNORMAL LOW (ref 70–99)
Glucose-Capillary: 318 mg/dL — ABNORMAL HIGH (ref 70–99)
Glucose-Capillary: 161 mg/dL — ABNORMAL HIGH (ref 70–99)
Glucose-Capillary: 129 mg/dL — ABNORMAL HIGH (ref 70–99)
Glucose-Capillary: 71 mg/dL (ref 70–99)
Glucose-Capillary: 84 mg/dL (ref 70–99)

## 2024-04-17 LAB — TROPONIN I (HIGH SENSITIVITY)
Troponin I (High Sensitivity): 285 ng/L (ref <18)
Troponin I (High Sensitivity): 267 ng/L (ref <18)
Troponin I (High Sensitivity): 225 ng/L (ref <18)
Troponin I (High Sensitivity): 271 ng/L (ref <18)

## 2024-04-17 LAB — BASIC METABOLIC PANEL WITH GFR
Anion gap: 32 — ABNORMAL HIGH (ref 5–15)
BUN: 50 mg/dL — ABNORMAL HIGH (ref 6–20)
CO2: 18 mmol/L — ABNORMAL LOW (ref 22–32)
Calcium: 10.1 mg/dL (ref 8.9–10.3)
Chloride: 87 mmol/L — ABNORMAL LOW (ref 98–111)
Creatinine, Ser: 6.49 mg/dL — ABNORMAL HIGH (ref 0.44–1.00)
GFR, Estimated: 8 mL/min — ABNORMAL LOW (ref >60)
Glucose, Bld: 63 mg/dL — ABNORMAL LOW (ref 70–99)
Potassium: 5.2 mmol/L — ABNORMAL HIGH (ref 3.5–5.1)
Sodium: 137 mmol/L (ref 135–145)

## 2024-04-17 LAB — HEPATITIS B SURFACE ANTIGEN: Hepatitis B Surface Ag: NONREACTIVE

## 2024-04-17 LAB — HEPARIN LEVEL (UNFRACTIONATED): Heparin Unfractionated: <0.1 [IU]/mL — ABNORMAL LOW (ref 0.30–0.70)

## 2024-04-17 LAB — BETA-HYDROXYBUTYRIC ACID: Beta-Hydroxybutyric Acid: 0.31 mmol/L — ABNORMAL HIGH (ref 0.05–0.27)

## 2024-04-17 LAB — RESP PANEL BY RT-PCR (RSV, FLU A&B, COVID)  RVPGX2
Influenza A by PCR: NEGATIVE
Influenza B by PCR: NEGATIVE
Resp Syncytial Virus by PCR: NEGATIVE
SARS Coronavirus 2 by RT PCR: NEGATIVE

## 2024-04-17 LAB — BRAIN NATRIURETIC PEPTIDE: B Natriuretic Peptide: >4500 pg/mL — ABNORMAL HIGH (ref 0.0–100.0)

## 2024-04-17 LAB — ETHANOL: Alcohol, Ethyl (B): <10 mg/dL (ref <10)

## 2024-04-17 LAB — D-DIMER, QUANTITATIVE: D-Dimer, Quant: 1.95 ug{FEU}/mL — ABNORMAL HIGH (ref 0.00–0.50)

## 2024-04-17 MED ORDER — CINACALCET HCL 30 MG PO TABS
30.0000 mg | ORAL_TABLET | Freq: Every evening | ORAL | Status: DC
  Administered 2024-04-18 – 2024-04-20 (×3): 30 mg via ORAL
  Filled 2024-04-17 (×3): qty 1

## 2024-04-17 MED ORDER — AMIODARONE HCL IN DEXTROSE 360-4.14 MG/200ML-% IV SOLN
60.0000 mg/h | INTRAVENOUS | Status: DC
  Administered 2024-04-17: 60 mg/h via INTRAVENOUS
  Filled 2024-04-17: qty 200

## 2024-04-17 MED ORDER — NEPRO/CARBSTEADY PO LIQD
237.0000 mL | Freq: Two times a day (BID) | ORAL | Status: DC
  Administered 2024-04-18: 237 mL via ORAL

## 2024-04-17 MED ORDER — MIDODRINE HCL 5 MG PO TABS
10.0000 mg | ORAL_TABLET | Freq: Once | ORAL | Status: AC
  Administered 2024-04-17: 10 mg via ORAL
  Filled 2024-04-17: qty 2

## 2024-04-17 MED ORDER — ACETAMINOPHEN 325 MG PO TABS
650.0000 mg | ORAL_TABLET | Freq: Four times a day (QID) | ORAL | Status: DC | PRN
Start: 2024-04-17 — End: 2024-04-21
  Administered 2024-04-17 – 2024-04-21 (×4): 650 mg via ORAL
  Filled 2024-04-17 (×5): qty 2

## 2024-04-17 MED ORDER — SEVELAMER CARBONATE 800 MG PO TABS
2400.0000 mg | ORAL_TABLET | Freq: Three times a day (TID) | ORAL | Status: DC
  Administered 2024-04-17 – 2024-04-21 (×9): 2400 mg via ORAL
  Filled 2024-04-17 (×9): qty 3

## 2024-04-17 MED ORDER — IOHEXOL 350 MG/ML SOLN
80.0000 mL | Freq: Once | INTRAVENOUS | Status: AC | PRN
  Administered 2024-04-17: 75 mL via INTRAVENOUS

## 2024-04-17 MED ORDER — DEXTROSE 50 % IV SOLN
1.0000 | Freq: Once | INTRAVENOUS | Status: AC
  Administered 2024-04-17: 50 mL via INTRAVENOUS
  Filled 2024-04-17: qty 50

## 2024-04-17 MED ORDER — HYDRALAZINE HCL 20 MG/ML IJ SOLN: 5.0000 mg | INTRAMUSCULAR | Status: DC | PRN

## 2024-04-17 MED ORDER — AMIODARONE LOAD VIA INFUSION
150.0000 mg | Freq: Once | INTRAVENOUS | Status: AC
  Administered 2024-04-17: 150 mg via INTRAVENOUS
  Filled 2024-04-17: qty 83.34

## 2024-04-17 MED ORDER — HEPARIN BOLUS VIA INFUSION
2000.0000 [IU] | Freq: Once | INTRAVENOUS | Status: AC
  Administered 2024-04-17: 2000 [IU] via INTRAVENOUS
  Filled 2024-04-17: qty 2000

## 2024-04-17 MED ORDER — ONDANSETRON HCL 4 MG PO TABS
4.0000 mg | ORAL_TABLET | Freq: Four times a day (QID) | ORAL | Status: DC | PRN
Start: 2024-04-17 — End: 2024-04-21

## 2024-04-17 MED ORDER — SODIUM ZIRCONIUM CYCLOSILICATE 10 G PO PACK
10.0000 g | PACK | Freq: Once | ORAL | Status: AC
  Administered 2024-04-17: 10 g via ORAL
  Filled 2024-04-17: qty 1

## 2024-04-17 MED ORDER — AMIODARONE HCL IN DEXTROSE 360-4.14 MG/200ML-% IV SOLN
30.0000 mg/h | INTRAVENOUS | Status: DC
  Administered 2024-04-17 – 2024-04-20 (×6): 30 mg/h via INTRAVENOUS
  Filled 2024-04-17 (×7): qty 200

## 2024-04-17 MED ORDER — ONDANSETRON HCL 4 MG/2ML IJ SOLN: 4.0000 mg | Freq: Four times a day (QID) | INTRAMUSCULAR | Status: DC | PRN

## 2024-04-17 MED ORDER — SODIUM CHLORIDE 0.9% FLUSH
3.0000 mL | Freq: Two times a day (BID) | INTRAVENOUS | Status: DC
  Administered 2024-04-17 – 2024-04-21 (×9): 3 mL via INTRAVENOUS

## 2024-04-17 MED ORDER — HEPARIN (PORCINE) 25000 UT/250ML-% IV SOLN
550.0000 [IU]/h | INTRAVENOUS | Status: DC
  Administered 2024-04-17: 550 [IU]/h via INTRAVENOUS
  Filled 2024-04-17: qty 250

## 2024-04-17 MED ORDER — MIDODRINE HCL 5 MG PO TABS
10.0000 mg | ORAL_TABLET | Freq: Three times a day (TID) | ORAL | Status: DC
  Administered 2024-04-17 – 2024-04-21 (×11): 10 mg via ORAL
  Filled 2024-04-17 (×11): qty 2

## 2024-04-17 MED ORDER — MELATONIN 3 MG PO TABS
3.0000 mg | ORAL_TABLET | Freq: Every day | ORAL | Status: DC
  Administered 2024-04-17 – 2024-04-20 (×4): 3 mg via ORAL
  Filled 2024-04-17 (×4): qty 1

## 2024-04-17 MED ORDER — ACETAMINOPHEN 650 MG RE SUPP: 650.0000 mg | Freq: Four times a day (QID) | RECTAL | Status: DC | PRN

## 2024-04-17 NOTE — ED Triage Notes (Signed)
 Pt to ED via [facility] from home c/o SOB x2-3 weeks worsening today and having mid chest tightness especially with coughing, productive yellow thick phlegm.  Pt is dialysis goes on T, TR, Sat and last went Saturday.   EMS vitals: 69 CBG, 115/66 BP, 88% 3L [place].

## 2024-04-17 NOTE — ED Notes (Signed)
 IV team at bedside

## 2024-04-17 NOTE — ED Notes (Signed)
 Patient transported to X-ray

## 2024-04-17 NOTE — ED Notes (Signed)
 Patient transported to HD with primary RN.

## 2024-04-17 NOTE — H&P (Signed)
 History and Physical    Patient: [name] ZOX:[id number] DOB: Nov 01, 1981 DOA: 04/17/2024 DOS: the patient was seen and examined on 04/17/2024 PCP: Senaida Dama, NP  Patient coming from: Home Chief complaint: Chief Complaint  Patient presents with   Shortness of Breath   HPI:  [name] is a 43 y.o. female with past medical history  of atrial flutter history of A-fib, HFrEF with a EF of 30 to 35% on most recent echo, end-stage renal disease on hemodialysis Tuesday Thursday and Saturday schedule, valvular heart disease with mitral stenosis and regurg aortic insufficiency, allergy to cephalosporin, tobramycin, vancomycin  coming for shortness of breath.  On initial presentation patient was tachycardic and dyspneic and started on 3 L nasal cannula.  ED Course: Pt in ed at bedside  is alert awake oriented in no distress. Vital signs in the ED were notable for the following:  Vitals:   04/17/24 1000 04/17/24 1030 04/17/24 1220 04/17/24 1300  BP: 117/69 109/65 106/63 109/66  Pulse: (!) 108 (!) 108 (!) 109 (!) 107  Temp:   97.7 F (36.5 C)   Resp: (!) 22 (!) 23 (!) 29 (!) 22  Height:      Weight:      SpO2: 100% 99% 100% 98%  TempSrc:      BMI (Calculated):      >>ED evaluation thus far shows: -EKG as below showing a flutter with T wave inversion in lateral leads.  Elevated troponin of 285 and 267.  BNP at more than 4500. - BMP showing potassium of 5.2 bicarb of 18 anion gap of 32 creatinine of 6.49 BUN of 50 glucose of 63. - CBC is within normal limits.. - D-dimer is 1.95. - Viral panel negative for flu RSV and COVID. - While in the hospital today patient had a CT angio chest PE No PE, cardiomegaly, right lower lobe pulmonary nodule. -CT of the abdomen and pelvis with contrast shows small volume ascites with mesenteric edema congestive hepatopathy. >>While in the ED patient received the following for treatment of her hyperkalemia and her A-flutter RVR. Medications   amiodarone  (NEXTERONE ) 1.8 mg/mL load via infusion 150 mg (150 mg Intravenous Bolus from Bag 04/17/24 0731)    Followed by  amiodarone  (NEXTERONE  PREMIX) 360-4.14 MG/200ML-% (1.8 mg/mL) IV infusion (0 mg/hr Intravenous Stopped 04/17/24 1200)    Followed by  amiodarone  (NEXTERONE  PREMIX) 360-4.14 MG/200ML-% (1.8 mg/mL) IV infusion (30 mg/hr Intravenous New Bag/Given 04/17/24 1313)  heparin  bolus via infusion 2,000 Units (2,000 Units Intravenous Bolus from Bag 04/17/24 1234)    Followed by  heparin  ADULT infusion 100 units/mL (25000 units/250mL) (550 Units/hr Intravenous New Bag/Given 04/17/24 1229)  cinacalcet  (SENSIPAR ) tablet 30 mg (has no administration in time range)  sevelamer  carbonate (RENVELA ) tablet 2,400 mg (has no administration in time range)  midodrine  (PROAMATINE ) tablet 10 mg (has no administration in time range)  sodium chloride  flush (NS) 0.9 % injection 3 mL (has no administration in time range)  acetaminophen  (TYLENOL ) tablet 650 mg (has no administration in time range)    Or  acetaminophen  (TYLENOL ) suppository 650 mg (has no administration in time range)  ondansetron  (ZOFRAN ) tablet 4 mg (has no administration in time range)    Or  ondansetron  (ZOFRAN ) injection 4 mg (has no administration in time range)  hydrALAZINE  (APRESOLINE ) injection 5 mg (has no administration in time range)  midodrine  (PROAMATINE ) tablet 10 mg (10 mg Oral Given 04/17/24 0727)  dextrose  50 % solution 50 mL (50 mLs  Intravenous Given 04/17/24 0723)  sodium zirconium cyclosilicate  (LOKELMA ) packet 10 g (10 g Oral Given 04/17/24 0810)  iohexol  (OMNIPAQUE ) 350 MG/ML injection 80 mL (75 mLs Intravenous Contrast Given 04/17/24 0917)   Review of Systems  Respiratory:  Positive for shortness of breath.   Cardiovascular:  Positive for leg swelling.   Past Medical History:  Diagnosis Date   Anemia    low iron  - receives iron  at dialysis   Anxiety    Arthritis    RA   Atrial fibrillation with RVR (HCC)  12/14/2023   Chronic systolic congestive heart failure (HCC) 03/16/2016   Dyspnea    ESRD (end stage renal disease) (HCC)    [facility] _ [place]   H/O pericarditis 01/17/2013   H/O pleural effusion 01/17/2013   Heart murmur    Lupus (systemic lupus erythematosus) (HCC)    Previously followed with Dr. 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 pneumonae bacteremia on 01/21/2013. Sensitive to Peniccilin    Past Surgical History:  Procedure Laterality Date   A/V FISTULAGRAM N/A 12/15/2023   Procedure: A/V Fistulagram;  Surgeon: Patrick Boor, MD;  Location: [facility] INVASIVE CV LAB;  Service: Cardiovascular;  Laterality: N/A;   AV FISTULA PLACEMENT     AV FISTULA PLACEMENT Right 08/01/2020   Procedure: RIGHT ARM BRACHIOCEPHALIC  ARTERIOVENOUS (AV) FISTULA CREATION;  Surgeon: Mayo Speck, MD;  Location: [facility] OR;  Service: Vascular;  Laterality: Right;   BASCILIC VEIN TRANSPOSITION Left 02/05/2014   Procedure: BASCILIC VEIN TRANSPOSITION;  Surgeon: Mayo Speck, MD;  Location: [facility] OR;  Service: Vascular;  Laterality: Left;   BASCILIC VEIN TRANSPOSITION Right 01/08/2021   Procedure: RIGHT ARM SECOND STAGE BASCILIC VEIN TRANSPOSITION;  Surgeon: Mayo Speck, MD;  Location: [facility] OR;  Service: Vascular;  Laterality: Right;   BIOPSY  12/17/2021   Procedure: BIOPSY;  Surgeon: Normie Becton., MD;  Location: [facility] ENDOSCOPY;  Service: Gastroenterology;;   ESOPHAGOGASTRODUODENOSCOPY (EGD) WITH PROPOFOL  N/A 12/17/2021    Procedure: ESOPHAGOGASTRODUODENOSCOPY (EGD) WITH PROPOFOL ;  Surgeon: Normie Becton., MD;  Location: [facility] ENDOSCOPY;  Service: Gastroenterology;  Laterality: N/A;   FISTULA SUPERFICIALIZATION Left 05/30/2018   Procedure: FISTULA PLICATION BASILIC VEIN TRANSPOSITION;  Surgeon: Dannis Dy, MD;  Location: [facility] OR;  Service: Vascular;  Laterality: Left;   FISTULA SUPERFICIALIZATION Left 12/18/2019   Procedure: PLICATION OF LEFT ARTERIOVENOUS FISTULA ULCER;  Surgeon: Mayo Speck, MD;  Location: [facility] OR;  Service: Vascular;  Laterality: Left;   I & D EXTREMITY Right 02/18/2021   Procedure: IRRIGATION AND DEBRIDEMENT OF ARM;  Surgeon: Dannis Dy, MD;  Location: [facility] OR;  Service: Vascular;  Laterality: Right;   INSERTION OF DIALYSIS CATHETER N/A 05/19/2020   Procedure: TUNNELED INSERTION  OF DIALYSIS CATHETER;  Surgeon: Adine Hoof, MD;  Location: [facility] OR;  Service: Vascular;  Laterality: N/A;   PERIPHERAL VASCULAR BALLOON ANGIOPLASTY  12/15/2023   Procedure: PERIPHERAL VASCULAR BALLOON ANGIOPLASTY;  Surgeon: Patrick Boor, MD;  Location: [facility] INVASIVE CV LAB;  Service: Cardiovascular;;   THROMBECTOMY AND REVISION OF ARTERIOVENTOUS (AV) GORETEX  GRAFT Left 07/28/2020   Procedure: Oversewing of left arm Brachial cephalic fistula for bleeding.;  Surgeon: Dannis Dy, MD;  Location: [facility] OR;  Service: Vascular;  Laterality: Left;   VENOGRAM Right 01/31/2014   Procedure: DIALYSIS CATHETER;  Surgeon: Margherita Shell, MD;  Location: [facility] CATH LAB;  Service: Cardiovascular;  Laterality: Right;    reports that she has been smoking cigarettes. She has a 1.8 pack-year smoking history. She has been exposed to tobacco smoke. She has never used smokeless tobacco. She reports current alcohol  use. She reports current drug use. Drug: Marijuana. Allergies  Allergen Reactions   Cephalosporins Rash    To both keflex  and cefazolin .  Potential bullous lesion from Ceftriaxone  11/2023.    Tobramycin Sulfate Swelling    Eye swelling   Vancomycin  Swelling   History reviewed. No pertinent family history. Prior to Admission medications   Medication Sig Start Date End Date Taking? Authorizing Provider  albuterol  (PROVENTIL ) (2.5 MG/3ML) 0.083% nebulizer solution Take 3 mLs (2.5 mg total) by nebulization every 2 (two) hours as needed for wheezing or shortness of breath. Patient taking differently: Take 2.5 mg by nebulization every 6 (six) hours as needed for wheezing or shortness of breath. 12/06/22   Jackolyn Masker, MD  amiodarone  (PACERONE ) 200 MG tablet Take 1 tablet (200 mg total) by mouth daily. 01/31/24   Rai, Ripudeep Linnell Richardson, MD  benzonatate  (TESSALON ) 200 MG capsule Take 1 capsule (200 mg total) by mouth 3 (three) times daily as needed for cough. 01/30/24   Rai, Hurman Maiden, MD  cinacalcet  (SENSIPAR ) 30 MG tablet Take 30 mg by mouth every evening. 11/24/22   [provider]  diphenhydrAMINE  (BENADRYL ) 25 MG tablet Take 1 tablet (25 mg total) by mouth every 4 (four) hours as needed for itching or allergies. 01/23/21   Kraig Peru, MD  doxercalciferol  (HECTOROL ) 4 MCG/2ML injection Use as recommended by nephrology Patient not taking: Reported on 01/24/2024 11/05/20   Akula, Vijaya, MD  leptospermum manuka honey (MEDIHONEY) PSTE paste Apply 1 Application topically daily. 01/31/24   Rai, Hurman Maiden, MD  loperamide  (IMODIUM ) 2 MG capsule Take 1 capsule (2 mg total) by mouth as needed for diarrhea or loose stools. Patient taking differently: Take 2 mg by mouth every 8 (eight) hours as needed for diarrhea or loose stools. 12/22/23   Jayson Michael, MD  melatonin 3 MG TABS tablet Take 1 tablet (3 mg total) by mouth at bedtime. Also available OTC. 01/30/24   Rai, Hurman Maiden, MD  midodrine  (PROAMATINE ) 10 MG tablet Take 1 tablet (10 mg total) by mouth 3 (three) times daily with meals. 01/30/24   Rai, Ripudeep K, MD  multivitamin (RENA-VIT) TABS tablet Take 1 tablet by mouth daily.    [provider]  oxyCODONE  (OXY IR/ROXICODONE ) 5 MG immediate release tablet Take 1 tablet (5 mg total) by mouth every 6 (six) hours as needed for moderate pain (pain score 4-6) or severe pain (pain score 7-10). 01/30/24   Rai, Ripudeep K, MD  sevelamer  carbonate (RENVELA ) 800 MG tablet Take 3 tablets (2,400 mg total) by mouth 3 (three) times daily. 01/30/24   Loma Rising, MD  Vitals:   04/17/24 1000 04/17/24 1030 04/17/24 1220 04/17/24 1300  BP: 117/69 109/65 106/63 109/66  Pulse: (!) 108 (!) 108 (!) 109 (!) 107  Resp: (!) 22 (!) 23 (!) 29 (!) 22  Temp:   97.7 F (36.5 C)   TempSrc:      SpO2: 100% 99% 100% 98%  Weight:      Height:       Physical Exam Vitals and nursing note reviewed.  Constitutional:      General: She is not in acute distress. HENT:     Head: Normocephalic and atraumatic.     Right Ear: Hearing normal.     Left Ear: Hearing normal.     Nose: Nose normal. No nasal deformity.     Mouth/Throat:     Lips: Pink.     Tongue: No lesions.     Pharynx: Oropharynx is clear.  Eyes:     General: Lids are normal.     Extraocular Movements: Extraocular movements intact.  Cardiovascular:     Rate and Rhythm: Normal rate and regular rhythm.     Heart sounds: Normal heart sounds.  Pulmonary:     Effort: Pulmonary effort is normal.     Breath sounds: Normal breath sounds.  Abdominal:     General: Bowel sounds are normal. There is no distension.     Palpations: Abdomen is soft. There is no mass.     Tenderness: There is no abdominal tenderness.  Musculoskeletal:     Right lower leg: No edema.     Left lower leg: No edema.  Skin:    General: Skin is warm.  Neurological:     General: No focal deficit present.     Mental Status: She is alert and oriented to person, place, and time.     Cranial Nerves: Cranial nerves 2-12 are intact.  Psychiatric:        Attention and Perception: Attention  normal.        Mood and Affect: Mood normal.        Speech: Speech normal.        Behavior: Behavior normal. Behavior is cooperative.     Labs on Admission: I have personally reviewed following labs and imaging studies CBC: Recent Labs  Lab 04/17/24 0555  WBC 5.2  NEUTROABS 2.3  HGB 12.7  HCT 38.5  MCV 84.2  PLT 175   Basic Metabolic Panel: Recent Labs  Lab 04/17/24 0555  NA 137  K 5.2*  CL 87*  CO2 18*  GLUCOSE 63*  BUN 50*  CREATININE 6.49*  CALCIUM  10.1   GFR: Estimated Creatinine Clearance: 7.1 mL/min (A) (by C-G formula based on SCr of 6.49 mg/dL (H)). Liver Function Tests: No results for input(s): "AST", "ALT", "ALKPHOS", "BILITOT", "PROT", "ALBUMIN " in the last 168 hours. No results for input(s): "LIPASE", "AMYLASE" in the last 168 hours. No results for input(s): "AMMONIA" in the last 168 hours. Coagulation Profile: No results for input(s): "INR", "PROTIME" in the last 168 hours. Cardiac Enzymes: No results for input(s): "CKTOTAL", "CKMB", "CKMBINDEX", "TROPONINI" in the last 168 hours. BNP (last 3 results) No results for input(s): "PROBNP" in the last 8760 hours. HbA1C: No results for input(s): "HGBA1C" in the last 72 hours. CBG: Recent Labs  Lab 04/17/24 0811 04/17/24 0923 04/17/24 1016 04/17/24 1100 04/17/24 1143  GLUCAP 318* 161* 129* 71 84   Lipid Profile: No results for input(s): "CHOL", "HDL", "LDLCALC", "TRIG", "CHOLHDL", "LDLDIRECT" in the last 72 hours. Thyroid Function  Tests: No results for input(s): "TSH", "T4TOTAL", "FREET4", "T3FREE", "THYROIDAB" in the last 72 hours. Anemia Panel: No results for input(s): "VITAMINB12", "FOLATE", "FERRITIN", "TIBC", "IRON ", "RETICCTPCT" in the last 72 hours. Urine analysis:    Component Value Date/Time   COLORURINE YELLOW 09/25/2015 1412   APPEARANCEUR CLOUDY (A) 09/25/2015 1412   LABSPEC 1.018 09/25/2015 1412   PHURINE 8.0 09/25/2015 1412   GLUCOSEU NEGATIVE 09/25/2015 1412   HGBUR LARGE (A)  09/25/2015 1412   BILIRUBINUR NEGATIVE 09/25/2015 1412   KETONESUR NEGATIVE 09/25/2015 1412   PROTEINUR >300 (A) 09/25/2015 1412   UROBILINOGEN 0.2 09/25/2015 1412   NITRITE NEGATIVE 09/25/2015 1412   LEUKOCYTESUR MODERATE (A) 09/25/2015 1412   Radiological Exams on Admission: CT ABDOMEN PELVIS W CONTRAST Result Date: 04/17/2024 CLINICAL DATA:  Abdominal pain, acute, nonlocalized EXAM: CT ABDOMEN AND PELVIS WITH CONTRAST TECHNIQUE: Multidetector CT imaging of the abdomen and pelvis was performed using the standard protocol following bolus administration of intravenous contrast. RADIATION DOSE REDUCTION: This exam was performed according to the departmental dose-optimization program which includes automated exposure control, adjustment of the mA and/or kV according to patient size and/or use of iterative reconstruction technique. CONTRAST:  75mL OMNIPAQUE  IOHEXOL  350 MG/ML SOLN COMPARISON:  December 15, 2023 FINDINGS: Lower chest: For findings above the diaphragm, please see the separately dictated CT of the chest report, which was performed concurrently. Hepatobiliary: No mass. Reflux of contrast into the hepatic veins. Heterogeneous enhancement of the left hepatic lobe and inferior right hepatic tip.No radiopaque stones or wall thickening of the gallbladder.No intrahepatic or extrahepatic biliary ductal dilation. Pancreas: No mass or main ductal dilation.No peripancreatic inflammation or fluid collection. Spleen: No mass. Heterogeneous enhancement throughout the subcapsular spleen, likely due to the phase of contrast. Adrenals/Urinary Tract: No adrenal masses. Severe renal atrophy bilaterally. No mass. No nephrolithiasis or hydronephrosis. Decompressed urinary bladder without visualized focal abnormality. Stomach/Bowel: The stomach is decompressed without focal abnormality. No small bowel wall thickening or inflammation. No small bowel obstruction. Normal appendix. Vascular/Lymphatic: No aortic aneurysm.  No intraabdominal or pelvic lymphadenopathy. Reproductive: The uterus and ovaries are within normal limits for patient's age.No free pelvic fluid. Other: No pneumoperitoneum. Small volume ascites with diffuse mesenteric edema. Musculoskeletal: No acute fracture or destructive lesion.Renal osteodystrophy changes. 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 IMPRESSION: 1. No acute intra-abdominal or pelvic abnormality. 2. Small volume ascites with diffuse mesenteric edema, likely related to the patient's volume status. 3. Reflux of contrast into the hepatic veins, consistent with underlying cardiac dysfunction. Heterogeneous enhancement of the left hepatic lobe and inferior right hepatic tip is also likely related to the cardiac dysfunction (congestive hepatopathy). Correlation with liver enzymes recommended to exclude acute hepatitis. Electronically Signed   By: Rance Burrows M.D.   On: 04/17/2024 12:04   CT Angio Chest PE W and/or Wo Contrast Result Date: 04/17/2024 CLINICAL DATA:  Pulmonary embolism (PE) suspected, high prob EXAM: CT ANGIOGRAPHY CHEST WITH CONTRAST TECHNIQUE: Multidetector CT imaging of the chest was performed using the standard protocol during bolus administration of intravenous contrast. Multiplanar CT image reconstructions and MIPs were obtained to evaluate the vascular anatomy. RADIATION DOSE REDUCTION: This exam was performed according to the departmental dose-optimization program which includes automated exposure control, adjustment of the mA and/or kV according to patient size and/or use of iterative reconstruction technique. CONTRAST:  75mL OMNIPAQUE  IOHEXOL  350 MG/ML SOLN COMPARISON:  January 08, 2024 FINDINGS: Pulmonary Embolism: The segmental and subsegmental branches of the left lower lobe are not well opacified by the  contrast bolus. Otherwise, no visualized pulmonary embolus in the remaining pulmonary arteries. Cardiovascular: Severe cardiomegaly. No aortic aneurysm.  Diffuse aortic atherosclerosis. Extensive multi-vessel coronary atherosclerosis. Mediastinum/Nodes: No mediastinal mass. Enlarged multi station mediastinal and bilateral hilar lymph nodes, unchanged, likely reactive. Lungs/Pleura: The trachea is midline and patent. Mild diffuse bronchial wall thickening with interlobular septal thickening throughout the lungs. No lobar airspace consolidation or pneumothorax. Small bilateral pleural effusions with hazy ground-glass attenuation throughout the lungs. 4 mm right lower lobe nodule (axial 88), unchanged. Musculoskeletal: No acute fracture or destructive bone lesion. Renal osteodystrophy changes. Multilevel thoracic osteophytosis. Upper Abdomen: For findings below the diaphragm, please see the separately dictated CT of the abdomen and pelvis report, which was performed concurrently. Review of the MIP images confirms the above findings. IMPRESSION: 1. The segmental and subsegmental branches of the left lower lobe are not well opacified by the contrast bolus. Otherwise, no visualized pulmonary embolus in the remaining pulmonary arteries. 2. Severe cardiomegaly, unchanged, with superimposed findings of pulmonary edema. Small bilateral pleural effusions. 3. Right lower lobe pulmonary nodule measuring 4 mm, unchanged. Follow-up could be considered, as documented below. 4. For findings below the diaphragm, please see the separately dictated CT of the abdomen and pelvis report, which was performed concurrently. No follow-up needed if patient is low-risk.This recommendation follows the consensus statement: Guidelines for Management of Incidental Pulmonary Nodules Detected on CT Images: From the Fleischner Society 2017; Radiology 2017; 284:228-243. Aortic Atherosclerosis (ICD10-I70.0). Electronically Signed   By: Rance Burrows M.D.   On: 04/17/2024 11:49   DG Chest 2 View Result Date: 04/17/2024 CLINICAL DATA:  Shortness of breath EXAM: CHEST - 2 VIEW COMPARISON:  01/23/2024  FINDINGS: Unchanged cardiac enlargement. Small bilateral pleural effusions, left greater than right. Diffuse increase interstitial markings noted bilaterally. Bibasilar atelectasis. Visualized osseous structures appear intact. IMPRESSION: Cardiomegaly, small bilateral pleural effusions and diffuse increase interstitial markings compatible with CHF. Electronically Signed   By: Kimberley Penman M.D.   On: 04/17/2024 06:20   Data Reviewed: Relevant notes from primary care and specialist visits, past discharge summaries as available in EHR, including Care Everywhere. Prior diagnostic testing as pertinent to current admission diagnoses, Updated medications and problem lists for reconciliation ED course, including vitals, labs, imaging, treatment and response to treatment,Triage notes, nursing and pharmacy notes and ED provider's notes Notable results as noted in HPI.Discussed case with EDMD/ ED APP/ or Specialty MD on call and as needed.  Assessment & Plan  Problem patient is coming in with respiratory failure as a history of hemodialysis systolic congestive heart failure most likely due to her missed doses of amiodarone  causing a flutter and increased demand and decompensated CHF.  She was last admitted in February 2025 with similar presentation.  >> Acute hypoxic respiratory failure/ Acute on chronic systolic CHF: Attribute to patient's volume overload status from acute decompensated on chronic systolic congestive heart failure secondary to her missed amiodarone  doses patient has a history of noncompliance with her amiodarone . Continue with supplemental oxygen current O2 sats as below: SpO2: 98 % O2 Flow Rate (L/min): 3 L/min   >> End-stage renal disease on hemodialysis Tuesday Thursday and Saturday: I have messaged nephrology on-call in the event patient will most likely need hemodialysis today as she does not make urine or if she is stable can  get her HD tomorrow.  Nephrology consulted and  appreciate their management.  >> A flutter: EKG today shows a heart rate of 106 and a flutter. Noted twi in lat leads  v5,v6. Will get cardiology consult as I am concerned about ischemia and EKG changes.   Last echo in 11/2023 shows ef of 30-35%.   >> Chronic wound on her right thigh: Patient is being followed closely by wound clinic.    DVT prophylaxis:  Heparin   Consults:  Nephrology Cardiology.  Advance Care Planning:    Code Status: Full Code   Family Communication:  None Disposition Plan:  Home Severity of Illness: The appropriate patient status for this patient is INPATIENT. Inpatient status is judged to be reasonable and necessary in order to provide the required intensity of service to ensure the patient's safety. The patient's presenting symptoms, physical exam findings, and initial radiographic and laboratory data in the context of their chronic comorbidities is felt to place them at high risk for further clinical deterioration. Furthermore, it is not anticipated that the patient will be medically stable for discharge from the hospital within 2 midnights of admission.   * I certify that at the point of admission it is my clinical judgment that the patient will require inpatient hospital care spanning beyond 2 midnights from the point of admission due to high intensity of service, high risk for further deterioration and high frequency of surveillance required.*  Unresulted Labs (From admission, onward)     Start     Ordered   04/18/24 0500  Heparin  level (unfractionated)  Daily,   R      04/17/24 1222   04/18/24 0500  CBC  Daily,   R      04/17/24 1222   04/17/24 1900  Heparin  level (unfractionated)  Once-Timed,   URGENT        04/17/24 1222   04/17/24 1213  Hepatic function panel  Once,   URGENT        04/17/24 1212            Orders Placed This Encounter  Procedures   Resp panel by RT-PCR (RSV, Flu A&B, Covid) Anterior Nasal Swab   DG Chest 2 View   CT  Angio Chest PE W and/or Wo Contrast   CT ABDOMEN PELVIS W CONTRAST   Basic metabolic panel   CBC with Differential   Brain natriuretic peptide   D-dimer, quantitative   Hepatic function panel   Heparin  level (unfractionated)   Heparin  level (unfractionated)   CBC   Diet renal with fluid restriction Fluid restriction: 1200 mL Fluid; Room service appropriate? Yes; Fluid consistency: Thin   ED Cardiac monitoring   Wound care   Notify physician (specify)   Initiate Heart Failure Care Plan   Daily weights   Strict intake and output   In and Out Cath   Patient Education:   Apply Heart Failure Care Plan   [facility] and [facility] only) Obtain REDS clips reading Every morning   Cardiac Monitoring Continuous x 24 hours Indications for use: Sub-acute heart failure   Maintain IV access   Vital signs   Notify physician (specify)   Mobility Protocol: No Restrictions RN to initiate protocols based on patient's level of care   Refer to Sidebar Report Refer to ICU, Med-Surg, Progressive, and Step-Down Mobility Protocol Sidebars   Initiate Adult Central Line Maintenance and Catheter Protocol for patients with central line (CVC, PICC, Port, Hemodialysis, Trialysis)   If patient diabetic or glucose greater than 140 notify physician for Sliding Scale Insulin  Orders   Do not place and if present remove PureWick   Initiate Oral Care Protocol   Initiate Carrier Fluid Protocol  RN may order General Admission PRN Orders utilizing "General Admission PRN medications" (through manage orders) for the following patient needs: allergy symptoms (Claritin), cold sores (Carmex), cough (Robitussin DM), eye irritation (Liquifilm Tears), hemorrhoids (Tucks), indigestion (Maalox), minor skin irritation (Hydrocortisone  Cream), muscle pain Lovena Rubinstein Gay), nose irritation (saline nasal spray) and sore throat (Chloraseptic spray).   Full code   Consult to wound, ostomy, continence   heparin  per pharmacy consult   Consult to hospitalist    Consult to nephrology Consult Timeframe: ROUTINE - requires response within 24 hours; Reason for Consult? End-stage renal disease on hemodialysis, getting acute congestive heart failure volume overload setting in respiratory failure.   Inpatient consult to Cardiology Consult Timeframe: ROUTINE - requires response within 24 hours; Reason for Consult? A.flutter rvr and elelvated troponin suspect demand ishemia.   Consult to Transition of Care Team   Nutritional services consult   OT eval and treat   PT eval and treat   Pulse oximetry check with vital signs   Oxygen therapy Mode or (Route): Nasal cannula; Liters Per Minute: 2; Keep O2 saturation between: greater than 92 %   Incentive spirometry   CBG monitoring, ED   POC CBG, ED   POC CBG, ED   CBG monitoring, ED   ED EKG   EKG 12-Lead   EKG   Repeat EKG   EKG 12-Lead   EKG 12-Lead   Insert peripheral IV   Insert peripheral IV   Admit to Inpatient (patient's expected length of stay will be greater than 2 midnights or inpatient only procedure)    Author: Lavanda Porter, MD 12 pm -8 pm. 04/17/2024 1:29 PM >>Please note for any concern,or critical results after hours past 8pm please contact the [facility] hospitalist [facility] floor coverage provider from 7 PM- 7 AM. For on call review [email or web address], username [email or web address] and PW: your phone number<<

## 2024-04-17 NOTE — ED Notes (Signed)
 Patient transported to CT

## 2024-04-17 NOTE — Consult Note (Signed)
  [facility] Renal Consultation Note    Indication for Consultation:  Management of ESRD/hemodialysis, anemia, hypertension/volume, and secondary hyperparathyroidism. PCP:  HPI: [name] is a 43 y.o. 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   Presented to ED via EMS early this morning with acute on chronic dyspnea which woke her from sleep. She has been struggling with DOE for weeks and her dry weight has been lowered multiple times as an outpatient. Also with chills and CP. No fever, abd pain, N/V/D. In ED, she was hypotensive and tachypneic, but afebrile. Labs with K 5.2, BUN 50, Alb 2.6, BNP >4500, Trop 285 -> 267. Chest/abdominal CT showed pulm effusions and ascites. In A-fib - started on IV amiodarone  + heparin .  Seen in ED bed - in mild respiratory distress, using O2.   Dialyzes on MWF schedule at [facility] clinic via RUE AVF.   Past Medical History:  Diagnosis Date   Anemia    low iron  - receives iron  at dialysis   Anxiety    Arthritis    RA   Atrial fibrillation with RVR (HCC) 12/14/2023   Chronic systolic congestive heart failure (HCC) 03/16/2016   Dyspnea    ESRD (end stage renal disease) (HCC)    [facility] _ [place]   H/O pericarditis 01/17/2013   H/O pleural effusion 01/17/2013   Heart murmur    Lupus (systemic lupus erythematosus) (HCC)    Previously followed with Dr. 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 pneumonae bacteremia on 01/21/2013. Sensitive to Peniccilin    Past Surgical History:  Procedure Laterality Date   A/V FISTULAGRAM N/A 12/15/2023   Procedure: A/V Fistulagram;  Surgeon: Patrick Boor, MD;  Location: [facility] INVASIVE CV LAB;  Service: Cardiovascular;  Laterality: N/A;   AV FISTULA PLACEMENT     AV FISTULA PLACEMENT Right 08/01/2020   Procedure: RIGHT ARM BRACHIOCEPHALIC  ARTERIOVENOUS (AV) FISTULA CREATION;  Surgeon: Mayo Speck, MD;  Location: [facility] OR;  Service: Vascular;  Laterality: Right;   BASCILIC VEIN TRANSPOSITION Left 02/05/2014   Procedure: BASCILIC VEIN TRANSPOSITION;  Surgeon: Mayo Speck, MD;  Location: [facility] OR;  Service: Vascular;  Laterality: Left;   BASCILIC VEIN TRANSPOSITION Right 01/08/2021   Procedure: RIGHT ARM SECOND STAGE BASCILIC VEIN TRANSPOSITION;  Surgeon: Mayo Speck, MD;  Location: [facility] OR;  Service: Vascular;  Laterality: Right;   BIOPSY  12/17/2021   Procedure: BIOPSY;  Surgeon: Normie Becton., MD;  Location: [facility] ENDOSCOPY;  Service: Gastroenterology;;   ESOPHAGOGASTRODUODENOSCOPY (EGD) WITH PROPOFOL  N/A 12/17/2021   Procedure: ESOPHAGOGASTRODUODENOSCOPY (EGD) WITH PROPOFOL ;  Surgeon: Normie Becton., MD;  Location: [facility] ENDOSCOPY;  Service: Gastroenterology;  Laterality: N/A;   FISTULA SUPERFICIALIZATION Left 05/30/2018   Procedure: FISTULA PLICATION BASILIC VEIN TRANSPOSITION;  Surgeon: Dannis Dy, MD;  Location: [facility] OR;  Service: Vascular;  Laterality: Left;   FISTULA SUPERFICIALIZATION Left 12/18/2019   Procedure: PLICATION OF LEFT ARTERIOVENOUS  FISTULA ULCER;  Surgeon: Mayo Speck, MD;  Location: [facility] OR;  Service: Vascular;  Laterality: Left;   I & D EXTREMITY Right 02/18/2021   Procedure: IRRIGATION AND DEBRIDEMENT OF ARM;  Surgeon: Dannis Dy, MD;  Location: [facility] OR;  Service: Vascular;  Laterality: Right;   INSERTION OF DIALYSIS CATHETER N/A 05/19/2020   Procedure: TUNNELED INSERTION  OF DIALYSIS CATHETER;   Surgeon: Adine Hoof, MD;  Location: [facility] OR;  Service: Vascular;  Laterality: N/A;   PERIPHERAL VASCULAR BALLOON ANGIOPLASTY  12/15/2023   Procedure: PERIPHERAL VASCULAR BALLOON ANGIOPLASTY;  Surgeon: Patrick Boor, MD;  Location: [facility] INVASIVE CV LAB;  Service: Cardiovascular;;   THROMBECTOMY AND REVISION OF ARTERIOVENTOUS (AV) GORETEX  GRAFT Left 07/28/2020   Procedure: Oversewing of left arm Brachial cephalic fistula for bleeding.;  Surgeon: Dannis Dy, MD;  Location: [facility] OR;  Service: Vascular;  Laterality: Left;   VENOGRAM Right 01/31/2014   Procedure: DIALYSIS CATHETER;  Surgeon: Margherita Shell, MD;  Location: [facility] C[facility] LAB;  Service: Cardiovascular;  Laterality: Right;   History reviewed. No pertinent family history. Social History:  reports that she has been smoking cigarettes. She has a 1.8 pack-year smoking history. She has been exposed to tobacco smoke. She has never used smokeless tobacco. She reports current alcohol  use. She reports current drug use. Drug: Marijuana.  ROS: As per HPI otherwise negative.  Physical Exam: Vitals:   04/17/24 1000 04/17/24 1030 04/17/24 1220 04/17/24 1300  BP: 117/69 109/65 106/63 109/66  Pulse: (!) 108 (!) 108 (!) 109 (!) 107  Resp: (!) 22 (!) 23 (!) 29 (!) 22  Temp:   97.7 F (36.5 C)   TempSrc:      SpO2: 100% 99% 100% 98%  Weight:      Height:         General: Frail, chronically ill appearing in very mild resp distress, can't get comfortable Head: Normocephalic, atraumatic, sclera non-icteric, mucus membranes are moist. Neck: Supple without lymphadenopathy/masses. JVD not elevated. Lungs: Rhonchi throughout Heart: RRR with normal S1, S2. No murmurs, rubs, or gallops appreciated. Abdomen: Soft, slightly distended Musculoskeletal:  Strength and tone appear normal for age. Lower extremities: No edema or ischemic changes, no open wounds. Neuro: Alert and oriented X 3. Moves all extremities spontaneously. Psych:  Responds to  questions appropriately with a normal affect. Dialysis Access: RUE AVF +t/b  Allergies  Allergen Reactions   Cephalosporins Rash    To both keflex  and cefazolin .  Potential bullous lesion from Ceftriaxone  11/2023.   Tobramycin Sulfate Swelling    Eye swelling   Vancomycin  Swelling   Prior to Admission medications   Medication Sig Start Date End Date Taking? Authorizing Provider  albuterol  (PROVENTIL ) (2.5 MG/3ML) 0.083% nebulizer solution Take 3 mLs (2.5 mg total) by nebulization every 2 (two) hours as needed for wheezing or shortness of breath. Patient taking differently: Take 2.5 mg by nebulization every 6 (six) hours as needed for wheezing or shortness of breath. 12/06/22   Jackolyn Masker, MD  amiodarone  (PACERONE ) 200 MG tablet Take 1 tablet (200 mg total) by mouth daily. 01/31/24   Rai, Hurman Maiden, MD  benzonatate  (TESSALON ) 200 MG capsule Take 1 capsule (200 mg total) by mouth 3 (three) times daily as needed for cough. 01/30/24   Rai, Hurman Maiden, MD  cinacalcet  (SENSIPAR ) 30 MG tablet Take 30 mg by mouth every evening. 11/24/22   [provider]  diphenhydrAMINE  (BENADRYL ) 25 MG tablet Take 1 tablet (25 mg total)  by mouth every 4 (four) hours as needed for itching or allergies. 01/23/21   Kraig Peru, MD  doxercalciferol  (HECTOROL ) 4 MCG/2ML injection Use as recommended by nephrology Patient not taking: Reported on 01/24/2024 11/05/20   Akula, Vijaya, MD  leptospermum manuka honey (MEDIHONEY) PSTE paste Apply 1 Application topically daily. 01/31/24   Rai, Hurman Maiden, MD  loperamide  (IMODIUM ) 2 MG capsule Take 1 capsule (2 mg total) by mouth as needed for diarrhea or loose stools. Patient taking differently: Take 2 mg by mouth every 8 (eight) hours as needed for diarrhea or loose stools. 12/22/23   Jayson Michael, MD  melatonin 3 MG TABS tablet Take 1 tablet (3 mg total) by mouth at bedtime. Also available OTC. 01/30/24   Rai, Hurman Maiden, MD  midodrine  (PROAMATINE ) 10 MG tablet Take 1  tablet (10 mg total) by mouth 3 (three) times daily with meals. 01/30/24   Rai, Ripudeep K, MD  multivitamin (RENA-VIT) TABS tablet Take 1 tablet by mouth daily.    [provider]  oxyCODONE  (OXY IR/ROXICODONE ) 5 MG immediate release tablet Take 1 tablet (5 mg total) by mouth every 6 (six) hours as needed for moderate pain (pain score 4-6) or severe pain (pain score 7-10). 01/30/24   Rai, Ripudeep K, MD  sevelamer  carbonate (RENVELA ) 800 MG tablet Take 3 tablets (2,400 mg total) by mouth 3 (three) times daily. 01/30/24   Loma Rising, MD   Current Facility-Administered Medications  Medication Dose Route Frequency Provider Last Rate Last Admin   acetaminophen  (TYLENOL ) tablet 650 mg  650 mg Oral Q6H PRN Patel, Ekta V, MD       Or   acetaminophen  (TYLENOL ) suppository 650 mg  650 mg Rectal Q6H PRN Patel, Ekta V, MD       amiodarone  (NEXTERONE  PREMIX) 360-4.14 MG/200ML-% (1.8 mg/mL) IV infusion  30 mg/hr Intravenous Continuous Russella Courts A, DO 16.67 mL/hr at 04/17/24 1313 30 mg/hr at 04/17/24 1313   cinacalcet  (SENSIPAR ) tablet 30 mg  30 mg Oral QPM Lavanda Porter, MD       heparin  ADULT infusion 100 units/mL (25000 units/250mL)  550 Units/hr Intravenous Continuous Katsaros, Lindsey R, RPH 5.5 mL/hr at 04/17/24 1229 550 Units/hr at 04/17/24 1229   hydrALAZINE  (APRESOLINE ) injection 5 mg  5 mg Intravenous Q4H PRN Patel, Ekta V, MD       midodrine  (PROAMATINE ) tablet 10 mg  10 mg Oral TID WC Lavanda Porter, MD       ondansetron  (ZOFRAN ) tablet 4 mg  4 mg Oral Q6H PRN Patel, Ekta V, MD       Or   ondansetron  (ZOFRAN ) injection 4 mg  4 mg Intravenous Q6H PRN Patel, Ekta V, MD       sevelamer  carbonate (RENVELA ) tablet 2,400 mg  2,400 mg Oral TID with meals Lavanda Porter, MD       sodium chloride  flush (NS) 0.9 % injection 3 mL  3 mL Intravenous Q12H Lavanda Porter, MD       Current Outpatient Medications  Medication Sig Dispense Refill   albuterol  (PROVENTIL ) (2.5 MG/3ML) 0.083% nebulizer  solution Take 3 mLs (2.5 mg total) by nebulization every 2 (two) hours as needed for wheezing or shortness of breath. (Patient taking differently: Take 2.5 mg by nebulization every 6 (six) hours as needed for wheezing or shortness of breath.) 75 mL 12   amiodarone  (PACERONE ) 200 MG tablet Take 1 tablet (200 mg total) by mouth daily. 30 tablet 3  benzonatate  (TESSALON ) 200 MG capsule Take 1 capsule (200 mg total) by mouth 3 (three) times daily as needed for cough. 45 capsule 0   cinacalcet  (SENSIPAR ) 30 MG tablet Take 30 mg by mouth every evening.     diphenhydrAMINE  (BENADRYL ) 25 MG tablet Take 1 tablet (25 mg total) by mouth every 4 (four) hours as needed for itching or allergies. 30 tablet 0   [Paused] doxercalciferol  (HECTOROL ) 4 MCG/2ML injection Use as recommended by nephrology (Patient not taking: Reported on 01/24/2024) 2 mL    leptospermum manuka honey (MEDIHONEY) PSTE paste Apply 1 Application topically daily. 30 mL 3   loperamide  (IMODIUM ) 2 MG capsule Take 1 capsule (2 mg total) by mouth as needed for diarrhea or loose stools. (Patient taking differently: Take 2 mg by mouth every 8 (eight) hours as needed for diarrhea or loose stools.)     melatonin 3 MG TABS tablet Take 1 tablet (3 mg total) by mouth at bedtime. Also available OTC. 30 tablet 0   midodrine  (PROAMATINE ) 10 MG tablet Take 1 tablet (10 mg total) by mouth 3 (three) times daily with meals. 90 tablet 3   multivitamin (RENA-VIT) TABS tablet Take 1 tablet by mouth daily.     oxyCODONE  (OXY IR/ROXICODONE ) 5 MG immediate release tablet Take 1 tablet (5 mg total) by mouth every 6 (six) hours as needed for moderate pain (pain score 4-6) or severe pain (pain score 7-10). 30 tablet 0   sevelamer  carbonate (RENVELA ) 800 MG tablet Take 3 tablets (2,400 mg total) by mouth 3 (three) times daily. 270 tablet 3   Labs: Basic Metabolic Panel: Recent Labs  Lab 04/17/24 0555  NA 137  K 5.2*  CL 87*  CO2 18*  GLUCOSE 63*  BUN 50*   CREATININE 6.49*  CALCIUM  10.1   Liver Function Tests: Recent Labs  Lab 04/17/24 0758  AST 39  ALT 23  ALKPHOS 352*  BILITOT 1.3*  PROT 8.1  ALBUMIN  2.6*   CBC: Recent Labs  Lab 04/17/24 0555  WBC 5.2  NEUTROABS 2.3  HGB 12.7  HCT 38.5  MCV 84.2  PLT 175   CBG: Recent Labs  Lab 04/17/24 0811 04/17/24 0923 04/17/24 1016 04/17/24 1100 04/17/24 1143  GLUCAP 318* 161* 129* 71 84   Studies/Results: CT ABDOMEN PELVIS W CONTRAST Result Date: 04/17/2024 CLINICAL DATA:  Abdominal pain, acute, nonlocalized EXAM: CT ABDOMEN AND PELVIS WITH CONTRAST TECHNIQUE: Multidetector CT imaging of the abdomen and pelvis was performed using the standard protocol following bolus administration of intravenous contrast. RADIATION DOSE REDUCTION: This exam was performed according to the departmental dose-optimization program which includes automated exposure control, adjustment of the mA and/or kV according to patient size and/or use of iterative reconstruction technique. CONTRAST:  75mL OMNIPAQUE  IOHEXOL  350 MG/ML SOLN COMPARISON:  December 15, 2023 FINDINGS: Lower chest: For findings above the diaphragm, please see the separately dictated CT of the chest report, which was performed concurrently. Hepatobiliary: No mass. Reflux of contrast into the hepatic veins. Heterogeneous enhancement of the left hepatic lobe and inferior right hepatic tip.No radiopaque stones or wall thickening of the gallbladder.No intrahepatic or extrahepatic biliary ductal dilation. Pancreas: No mass or main ductal dilation.No peripancreatic inflammation or fluid collection. Spleen: No mass. Heterogeneous enhancement throughout the subcapsular spleen, likely due to the phase of contrast. Adrenals/Urinary Tract: No adrenal masses. Severe renal atrophy bilaterally. No mass. No nephrolithiasis or hydronephrosis. Decompressed urinary bladder without visualized focal abnormality. Stomach/Bowel: The stomach is decompressed without  focal abnormality. No  small bowel wall thickening or inflammation. No small bowel obstruction. Normal appendix. Vascular/Lymphatic: No aortic aneurysm. No intraabdominal or pelvic lymphadenopathy. Reproductive: The uterus and ovaries are within normal limits for patient's age.No free pelvic fluid. Other: No pneumoperitoneum. Small volume ascites with diffuse mesenteric edema. Musculoskeletal: No acute fracture or destructive lesion.Renal osteodystrophy changes. 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 IMPRESSION: 1. No acute intra-abdominal or pelvic abnormality. 2. Small volume ascites with diffuse mesenteric edema, likely related to the patient's volume status. 3. Reflux of contrast into the hepatic veins, consistent with underlying cardiac dysfunction. Heterogeneous enhancement of the left hepatic lobe and inferior right hepatic tip is also likely related to the cardiac dysfunction (congestive hepatopathy). Correlation with liver enzymes recommended to exclude acute hepatitis. Electronically Signed   By: Rance Burrows M.D.   On: 04/17/2024 12:04   CT Angio Chest PE W and/or Wo Contrast Result Date: 04/17/2024 CLINICAL DATA:  Pulmonary embolism (PE) suspected, high prob EXAM: CT ANGIOGRAPHY CHEST WITH CONTRAST TECHNIQUE: Multidetector CT imaging of the chest was performed using the standard protocol during bolus administration of intravenous contrast. Multiplanar CT image reconstructions and MIPs were obtained to evaluate the vascular anatomy. RADIATION DOSE REDUCTION: This exam was performed according to the departmental dose-optimization program which includes automated exposure control, adjustment of the mA and/or kV according to patient size and/or use of iterative reconstruction technique. CONTRAST:  75mL OMNIPAQUE  IOHEXOL  350 MG/ML SOLN COMPARISON:  January 08, 2024 FINDINGS: Pulmonary Embolism: The segmental and subsegmental branches of the left lower lobe are not well opacified by the  contrast bolus. Otherwise, no visualized pulmonary embolus in the remaining pulmonary arteries. Cardiovascular: Severe cardiomegaly. No aortic aneurysm. Diffuse aortic atherosclerosis. Extensive multi-vessel coronary atherosclerosis. Mediastinum/Nodes: No mediastinal mass. Enlarged multi station mediastinal and bilateral hilar lymph nodes, unchanged, likely reactive. Lungs/Pleura: The trachea is midline and patent. Mild diffuse bronchial wall thickening with interlobular septal thickening throughout the lungs. No lobar airspace consolidation or pneumothorax. Small bilateral pleural effusions with hazy ground-glass attenuation throughout the lungs. 4 mm right lower lobe nodule (axial 88), unchanged. Musculoskeletal: No acute fracture or destructive bone lesion. Renal osteodystrophy changes. Multilevel thoracic osteophytosis. Upper Abdomen: For findings below the diaphragm, please see the separately dictated CT of the abdomen and pelvis report, which was performed concurrently. Review of the MIP images confirms the above findings. IMPRESSION: 1. The segmental and subsegmental branches of the left lower lobe are not well opacified by the contrast bolus. Otherwise, no visualized pulmonary embolus in the remaining pulmonary arteries. 2. Severe cardiomegaly, unchanged, with superimposed findings of pulmonary edema. Small bilateral pleural effusions. 3. Right lower lobe pulmonary nodule measuring 4 mm, unchanged. Follow-up could be considered, as documented below. 4. For findings below the diaphragm, please see the separately dictated CT of the abdomen and pelvis report, which was performed concurrently. No follow-up needed if patient is low-risk.This recommendation follows the consensus statement: Guidelines for Management of Incidental Pulmonary Nodules Detected on CT Images: From the Fleischner Society 2017; Radiology 2017; 284:228-243. Aortic Atherosclerosis (ICD10-I70.0). Electronically Signed   By: Rance Burrows  M.D.   On: 04/17/2024 11:49   DG Chest 2 View Result Date: 04/17/2024 CLINICAL DATA:  Shortness of breath EXAM: CHEST - 2 VIEW COMPARISON:  01/23/2024 FINDINGS: Unchanged cardiac enlargement. Small bilateral pleural effusions, left greater than right. Diffuse increase interstitial markings noted bilaterally. Bibasilar atelectasis. Visualized osseous structures appear intact. IMPRESSION: Cardiomegaly, small bilateral pleural effusions and diffuse increase interstitial markings compatible with CHF. Electronically Signed  By: Kimberley Penman M.D.   On: 04/17/2024 06:20    Dialysis Orders:  MWF - [facility]:45hr, 400/A1.5, EDW 39.5kg, 2K/2Ca bath, RUE AVF, no heparin  - no ESA, Hgb > 12 - Hectoral 2mcg IV q HD  Assessment/Plan:  Acute on Chronic Resp Failure/pulm edema: Symptomatic - will try to dialyze as soon as possible (we have an extremely large HD census at the moment)  ESRD:  Usual MWF schedule - HD today. No heparin .  Hypotension/volume: On midodrine , UF as tolerated  Anemia: Hgb > 12, no ESA needed  Metabolic bone disease: Ca high, hold VDRA for now. Continue home binders  Nutrition:  Alb low, rec high protein supplements  SLE  Janus Mercury, PA-C 04/17/2024, 2:04 PM  [employer]

## 2024-04-17 NOTE — ED Notes (Signed)
MD made aware of blood sugar  

## 2024-04-17 NOTE — ED Notes (Signed)
 Unable to obtain pulse ox at this time d/t cold extremities.  Will attempt to warm to get reading.

## 2024-04-17 NOTE — ED Provider Notes (Signed)
 [place] [facility]ERGENCY DEPARTMENT AT [facility] Provider Note  CSN: [id number] Arrival date & time: 04/17/24 4098  Chief Complaint(s) Shortness of Breath  HPI [name] is a 43 y.o. female with past medical history as below, significant for afib/aflutter, ESRD TThSa, polysubstance abuse, heart murmur, malnutrition who presents to the ED with complaint of cp/dib  She has been compliant with her HD, last session Saturday. She has variable compliance with her amiodarone  and has missed multiple doses this week including last 2 days worth. Has not taken her midodrine  this am. Having worsening dyspnea and chest tightness/pain this am compared to her baseline. Occ sputum production. No fevers or chills.   Past Medical History Past Medical History:  Diagnosis Date   Anemia    low iron  - receives iron  at dialysis   Anxiety    Arthritis    RA   Atrial fibrillation with RVR (HCC) 12/14/2023   Chronic systolic congestive heart failure (HCC) 03/16/2016   Dyspnea    ESRD (end stage renal disease) (HCC)    [facility] _ [place]   H/O pericarditis 01/17/2013   H/O pleural effusion 01/17/2013   Heart murmur    Lupus (systemic lupus erythematosus) (HCC)    Previously followed with Dr. 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 pneumonae bacteremia on 01/21/2013. Sensitive to Peniccilin    Patient Active Problem List   Diagnosis Date Noted   SOB (shortness of breath) 04/17/2024   Protein-calorie  malnutrition, severe 01/28/2024   Acute respiratory failure (HCC) 01/23/2024   Atrial flutter (HCC) 01/23/2024   Hypotension 01/23/2024   Secondary hypercoagulability disorder (HCC) 01/23/2024   Open thigh wound 01/23/2024   Thrombocytopenia (HCC) 12/14/2023   Atrial fibrillation with RVR (HCC) 12/14/2023   Acute on chronic heart failure (HCC) 12/06/2022   Seizures (HCC) 12/04/2022   R Subdural hematoma (HCC) 12/04/2022   Acute on chronic systolic CHF (congestive heart failure) (HCC) 11/30/2022   Nonrheumatic aortic valve insufficiency 11/30/2022   Nonrheumatic mitral valve regurgitation 11/30/2022   Non-ischemic cardiomyopathy (HCC) 11/30/2022   Acute febrile illness 04/12/2022   Hypertensive urgency 04/12/2022   GERD (gastroesophageal reflux disease) 04/12/2022   HFrEF (heart failure with reduced ejection fraction) (HCC) 04/12/2022   Elevated troponin 04/12/2022   SIRS (systemic inflammatory response syndrome) (HCC) 04/12/2022   Hyperkalemia, diminished renal excretion 12/15/2021   Trichomonas vaginitis 03/25/2021   Bacterial vaginitis 03/25/2021   Encounter for screening for COVID-19 01/24/2021   Hemodialysis access, fistula mature (HCC) 01/22/2021   Allergic reaction 01/22/2021   Acute respiratory failure with hypoxia (HCC) 11/02/2020   Acute hyperkalemia 07/28/2020   Nicotine  dependence, cigarettes, uncomplicated 07/28/2020   Essential hypertension 07/28/2020   Substance abuse (HCC) 07/28/2020   Hemodialysis catheter malfunction (HCC) 07/28/2020   Complication of AV dialysis fistula, sequela 07/28/2020   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HCC) 05/18/2020   Bacterial intestinal infection, unspecified 12/14/2019   Left shoulder pain 08/15/2019   Cellulitis, unspecified 05/02/2019   Hyperkalemia 07/29/2018  SVT (supraventricular tachycardia) (HCC) 07/16/2018   Cardiomyopathy, unspecified (HCC) 12/03/2016   Diarrhea 04/18/2016   Chronic combined systolic (congestive) and diastolic  (congestive) heart failure (HCC) 03/16/2016   Other hypervolemia    S/P thoracentesis    Cough with hemoptysis    Myalgia    Pulmonary edema    ESRD (end stage renal disease) (HCC) 03/03/2016   Dependence on renal dialysis (HCC) 10/15/2015   Rash and nonspecific skin eruption 06/26/2015   Secondary Raynaud's phenomenon 06/24/2015   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   Fever, unspecified 08/28/2014   Pruritus, unspecified 08/28/2014   Headache, unspecified 04/30/2014   Pain, unspecified 04/30/2014   Cramping of hands 04/19/2014   Contraceptive management 04/19/2014   Mild protein-calorie malnutrition (HCC) 03/22/2014   Insomnia 03/15/2014   Benign hypertension with ESRD (end-stage renal disease) (HCC) 03/15/2014   Tobacco abuse 02/15/2014   Healthcare maintenance 02/15/2014   Iron  deficiency anemia, unspecified 02/12/2014   Coagulation defect, unspecified (HCC) 02/10/2014   Anemia due to chronic kidney disease 02/09/2014   Secondary hyperparathyroidism of renal origin (HCC) 02/09/2014   Hypoalbuminemia 02/01/2014   Nephrotic syndrome 02/01/2014   ESRD on dialysis (HCC) 01/31/2014   Pleural effusion, left 01/31/2014   Microcytic anemia 01/29/2014   Hypocalcemia 01/29/2014   Bacteremia 01/23/2013   Marijuana smoker (HCC) 01/18/2013   H/O pericarditis 01/17/2013   Lupus (systemic lupus erythematosus) (HCC) 01/17/2013   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 Authorizing Provider  albuterol  (PROVENTIL ) (2.5 MG/3ML) 0.083% nebulizer solution Take 3 mLs (2.5 mg total) by nebulization every 2 (two) hours as needed for wheezing or shortness of breath. Patient taking differently:  Take 2.5 mg by nebulization every 6 (six) hours as needed for wheezing or shortness of breath. 12/06/22   Jackolyn Masker, MD  amiodarone  (PACERONE ) 200 MG tablet Take 1 tablet (200 mg total) by mouth daily. 01/31/24   Rai, Hurman Maiden, MD  benzonatate  (TESSALON ) 200 MG capsule Take 1 capsule (200 mg total) by mouth 3 (three) times daily as needed for cough. 01/30/24   Rai, Ripudeep Linnell Richardson, MD  cinacalcet  (SENSIPAR ) 30 MG tablet Take 30 mg by mouth every evening. 11/24/22   [provider]  diphenhydrAMINE  (BENADRYL ) 25 MG tablet Take 1 tablet (25 mg total) by mouth every 4 (four) hours as needed for itching or allergies. 01/23/21   Kraig Peru, MD  doxercalciferol  (HECTOROL ) 4 MCG/2ML injection Use as recommended by nephrology Patient not taking: Reported on 01/24/2024 11/05/20   Akula, Vijaya, MD  leptospermum manuka honey (MEDIHONEY) PSTE paste Apply 1 Application topically daily. 01/31/24   Rai, Hurman Maiden, MD  loperamide  (IMODIUM ) 2 MG capsule Take 1 capsule (2 mg total) by mouth as needed for diarrhea or loose stools. Patient taking differently: Take 2 mg by mouth every 8 (eight) hours as needed for diarrhea or loose stools. 12/22/23   Jayson Michael, MD  melatonin 3 MG TABS tablet Take 1 tablet (3 mg total) by mouth at bedtime. Also available OTC. 01/30/24   Rai, Hurman Maiden, MD  midodrine  (PROAMATINE ) 10 MG tablet Take 1 tablet (10 mg total) by mouth 3 (three) times daily with meals. 01/30/24   Rai, Hurman Maiden, MD  multivitamin (RENA-VIT) TABS tablet Take 1 tablet  by mouth daily.    [provider]  oxyCODONE  (OXY IR/ROXICODONE ) 5 MG immediate release tablet Take 1 tablet (5 mg total) by mouth every 6 (six) hours as needed for moderate pain (pain score 4-6) or severe pain (pain score 7-10). 01/30/24   Rai, Ripudeep K, MD  sevelamer  carbonate (RENVELA ) 800 MG tablet Take 3 tablets (2,400 mg total) by mouth 3 (three) times daily. 01/30/24   Loma Rising, MD                                                                                                                                     Past Surgical History Past Surgical History:  Procedure Laterality Date   A/V FISTULAGRAM N/A 12/15/2023   Procedure: A/V Fistulagram;  Surgeon: Patrick Boor, MD;  Location: [facility] INVASIVE CV LAB;  Service: Cardiovascular;  Laterality: N/A;   AV FISTULA PLACEMENT     AV FISTULA PLACEMENT Right 08/01/2020   Procedure: RIGHT ARM BRACHIOCEPHALIC  ARTERIOVENOUS (AV) FISTULA CREATION;  Surgeon: Mayo Speck, MD;  Location: [facility] OR;  Service: Vascular;  Laterality: Right;   BASCILIC VEIN TRANSPOSITION Left 02/05/2014   Procedure: BASCILIC VEIN TRANSPOSITION;  Surgeon: Mayo Speck, MD;  Location: [facility] OR;  Service: Vascular;  Laterality: Left;   BASCILIC VEIN TRANSPOSITION Right 01/08/2021   Procedure: RIGHT ARM SECOND STAGE BASCILIC VEIN TRANSPOSITION;  Surgeon: Mayo Speck, MD;  Location: [facility] OR;  Service: Vascular;  Laterality: Right;   BIOPSY  12/17/2021   Procedure: BIOPSY;  Surgeon: Normie Becton., MD;  Location: [facility] ENDOSCOPY;  Service: Gastroenterology;;   ESOPHAGOGASTRODUODENOSCOPY (EGD) WITH PROPOFOL  N/A 12/17/2021   Procedure: ESOPHAGOGASTRODUODENOSCOPY (EGD) WITH PROPOFOL ;  Surgeon: Normie Becton., MD;  Location: [facility] ENDOSCOPY;  Service: Gastroenterology;  Laterality: N/A;   FISTULA SUPERFICIALIZATION Left 05/30/2018   Procedure: FISTULA PLICATION BASILIC VEIN TRANSPOSITION;  Surgeon: Dannis Dy, MD;  Location: [facility] OR;  Service: Vascular;  Laterality: Left;   FISTULA SUPERFICIALIZATION Left 12/18/2019   Procedure: PLICATION OF LEFT ARTERIOVENOUS FISTULA ULCER;  Surgeon: Mayo Speck, MD;  Location[facility] OR;  Service: Vascular;  Laterality: Left;   I & D EXTREMITY Right 02/18/2021   Procedure: IRRIGATION AND DEBRIDEMENT OF ARM;  Surgeon: Dannis Dy, MD;  Location: [facility] OR;  Service: Vascular;  Laterality: Right;   INSERTION OF DIALYSIS CATHETER N/A 05/19/2020   Procedure:  TUNNELED INSERTION  OF DIALYSIS CATHETER;  Surgeon: Adine Hoof, MD;  Location: [facility] OR;  Service: Vascular;  Laterality: N/A;   PERIPHERAL VASCULAR BALLOON ANGIOPLASTY  12/15/2023   Procedure: PERIPHERAL VASCULAR BALLOON ANGIOPLASTY;  Surgeon: Patrick Boor, MD;  Location: [facility] INVASIVE CV LAB;  Service: Cardiovascular;;   THROMBECTOMY AND REVISION OF ARTERIOVENTOUS (AV) GORETEX  GRAFT Left 07/28/2020   Procedure: Oversewing of left arm Brachial cephalic fistula for bleeding.;  Surgeon: Dannis Dy, MD;  Location: [facility] OR;  Service: Vascular;  Laterality: Left;   VENOGRAM Right 01/31/2014   Procedure: DIALYSIS CATHETER;  Surgeon: Margherita Shell, MD;  Location: [facility] CATH LAB;  Service: Cardiovascular;  Laterality: Right;   Family History History reviewed. No pertinent family history.  Social History Social History   Tobacco Use   Smoking status: Every Day    Current packs/day: 0.12    Average packs/day: 0.1 packs/day for 15.0 years (1.8 ttl pk-yrs)    Types: Cigarettes    Passive exposure: Current   Smokeless tobacco: Never   Tobacco comments:    4 cigarettes per day  Vaping Use   Vaping status: Never Used  Substance Use Topics   Alcohol  use: Yes    Alcohol /week: 0.0 standard drinks of alcohol     Comment: Special Occasional takes Vicar   Drug use: Yes    Types: Marijuana    Comment: ocassional, last time- 12/21/20   Allergies Cephalosporins, Tobramycin sulfate, and Vancomycin   Review of Systems A thorough review of systems was obtained and all systems are negative except as noted in the HPI and PMH.   Physical Exam Vital Signs  I have reviewed the triage vital signs BP 109/66   Pulse (!) 107   Temp 97.7 F (36.5 C)   Resp (!) 22   Ht 5\' 3"  (1.6 m)   Wt 39 kg   LMP  (LMP Unknown) Comment: states over 2 years ago  SpO2 98%   BMI 15.23 kg/m  Physical Exam Vitals and nursing note reviewed.  Constitutional:      General: She is not in acute distress.     Appearance: Normal appearance.  HENT:     Head: Normocephalic and atraumatic.     Right Ear: External ear normal.     Left Ear: External ear normal.     Nose: Nose normal.     Mouth/Throat:     Mouth: Mucous membranes are moist.  Eyes:     General: No scleral icterus.       Right eye: No discharge.        Left eye: No discharge.  Cardiovascular:     Rate and Rhythm: Tachycardia present.     Pulses: Normal pulses.     Heart sounds: Normal heart sounds.     Comments: aflutter Pulmonary:     Effort: Pulmonary effort is normal. Tachypnea present. No respiratory distress.     Breath sounds: No stridor. Decreased breath sounds present.  Abdominal:     General: Abdomen is flat. There is no distension.     Palpations: Abdomen is soft.     Tenderness: There is no abdominal tenderness.  Musculoskeletal:     Cervical back: No rigidity.     Right lower leg: No edema.     Left lower leg: No edema.  Skin:    General: Skin is warm and dry.     Capillary Refill: Capillary refill takes less than 2 seconds.       Neurological:     Mental Status: She is alert.  Psychiatric:        Mood and Affect: Mood normal.        Behavior: Behavior normal. Behavior is cooperative.     ED Results and Treatments Labs (all labs ordered are listed, but only abnormal results are displayed) Labs Reviewed  BASIC METABOLIC PANEL WITH GFR - Abnormal; Notable for the following components:      Result Value   Potassium 5.2 (*)    Chloride 87 (*)  CO2 18 (*)    Glucose, Bld 63 (*)    BUN 50 (*)    Creatinine, Ser 6.49 (*)    GFR, Estimated 8 (*)    Anion gap 32 (*)    All other components within normal limits  CBC WITH DIFFERENTIAL/PLATELET - Abnormal; Notable for the following components:   nRBC 0.4 (*)    All other components within normal limits  BRAIN NATRIURETIC PEPTIDE - Abnormal; Notable for the following components:   B Natriuretic Peptide >4,500.0 (*)    All other components within  normal limits  D-DIMER, QUANTITATIVE (NOT AT [facility]) - Abnormal; Notable for the following components:   D-Dimer, Quant 1.95 (*)    All other components within normal limits  CBG MONITORING, ED - Abnormal; Notable for the following components:   Glucose-Capillary 67 (*)    All other components within normal limits  CBG MONITORING, ED - Abnormal; Notable for the following components:   Glucose-Capillary 318 (*)    All other components within normal limits  CBG MONITORING, ED - Abnormal; Notable for the following components:   Glucose-Capillary 161 (*)    All other components within normal limits  CBG MONITORING, ED - Abnormal; Notable for the following components:   Glucose-Capillary 129 (*)    All other components within normal limits  TROPONIN I (HIGH SENSITIVITY) - Abnormal; Notable for the following components:   Troponin I (High Sensitivity) 285 (*)    All other components within normal limits  TROPONIN I (HIGH SENSITIVITY) - Abnormal; Notable for the following components:   Troponin I (High Sensitivity) 267 (*)    All other components within normal limits  RESP PANEL BY RT-PCR (RSV, FLU A&B, COVID)  [id number]  HEPATIC FUNCTION PANEL  HEPARIN  LEVEL (UNFRACTIONATED)  LACTIC ACID, PLASMA  LACTIC ACID, PLASMA  BETA-HYDROXYBUTYRIC ACID  ETHANOL  RAPID URINE DRUG SCREEN, HOSP PERFORMED  CBG MONITORING, ED  CBG MONITORING, ED  CBG MONITORING, ED  TROPONIN I (HIGH SENSITIVITY)                                                                                                                          Radiology CT ABDOMEN PELVIS W CONTRAST Result Date: 04/17/2024 CLINICAL DATA:  Abdominal pain, acute, nonlocalized EXAM: CT ABDOMEN AND PELVIS WITH CONTRAST TECHNIQUE: Multidetector CT imaging of the abdomen and pelvis was performed using the standard protocol following bolus administration of intravenous contrast. RADIATION DOSE REDUCTION: This exam was performed according to the departmental  dose-optimization program which includes automated exposure control, adjustment of the mA and/or kV according to patient size and/or use of iterative reconstruction technique. CONTRAST:  75mL OMNIPAQUE  IOHEXOL  350 MG/ML SOLN COMPARISON:  December 15, 2023 FINDINGS: Lower chest: For findings above the diaphragm, please see the separately dictated CT of the chest report, which was performed concurrently. Hepatobiliary: No mass. Reflux of contrast into the hepatic veins. Heterogeneous enhancement of the left hepatic lobe and inferior right hepatic tip.No radiopaque stones  or wall thickening of the gallbladder.No intrahepatic or extrahepatic biliary ductal dilation. Pancreas: No mass or main ductal dilation.No peripancreatic inflammation or fluid collection. Spleen: No mass. Heterogeneous enhancement throughout the subcapsular spleen, likely due to the phase of contrast. Adrenals/Urinary Tract: No adrenal masses. Severe renal atrophy bilaterally. No mass. No nephrolithiasis or hydronephrosis. Decompressed urinary bladder without visualized focal abnormality. Stomach/Bowel: The stomach is decompressed without focal abnormality. No small bowel wall thickening or inflammation. No small bowel obstruction. Normal appendix. Vascular/Lymphatic: No aortic aneurysm. No intraabdominal or pelvic lymphadenopathy. Reproductive: The uterus and ovaries are within normal limits for patient's age.No free pelvic fluid. Other: No pneumoperitoneum. Small volume ascites with diffuse mesenteric edema. Musculoskeletal: No acute fracture or destructive lesion.Renal osteodystrophy changes. 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 IMPRESSION: 1. No acute intra-abdominal or pelvic abnormality. 2. Small volume ascites with diffuse mesenteric edema, likely related to the patient's volume status. 3. Reflux of contrast into the hepatic veins, consistent with underlying cardiac dysfunction. Heterogeneous enhancement of the left hepatic  lobe and inferior right hepatic tip is also likely related to the cardiac dysfunction (congestive hepatopathy). Correlation with liver enzymes recommended to exclude acute hepatitis. Electronically Signed   By: Rance Burrows M.D.   On: 04/17/2024 12:04   CT Angio Chest PE W and/or Wo Contrast Result Date: 04/17/2024 CLINICAL DATA:  Pulmonary embolism (PE) suspected, high prob EXAM: CT ANGIOGRAPHY CHEST WITH CONTRAST TECHNIQUE: Multidetector CT imaging of the chest was performed using the standard protocol during bolus administration of intravenous contrast. Multiplanar CT image reconstructions and MIPs were obtained to evaluate the vascular anatomy. RADIATION DOSE REDUCTION: This exam was performed according to the departmental dose-optimization program which includes automated exposure control, adjustment of the mA and/or kV according to patient size and/or use of iterative reconstruction technique. CONTRAST:  75mL OMNIPAQUE  IOHEXOL  350 MG/ML SOLN COMPARISON:  January 08, 2024 FINDINGS: Pulmonary Embolism: The segmental and subsegmental branches of the left lower lobe are not well opacified by the contrast bolus. Otherwise, no visualized pulmonary embolus in the remaining pulmonary arteries. Cardiovascular: Severe cardiomegaly. No aortic aneurysm. Diffuse aortic atherosclerosis. Extensive multi-vessel coronary atherosclerosis. Mediastinum/Nodes: No mediastinal mass. Enlarged multi station mediastinal and bilateral hilar lymph nodes, unchanged, likely reactive. Lungs/Pleura: The trachea is midline and patent. Mild diffuse bronchial wall thickening with interlobular septal thickening throughout the lungs. No lobar airspace consolidation or pneumothorax. Small bilateral pleural effusions with hazy ground-glass attenuation throughout the lungs. 4 mm right lower lobe nodule (axial 88), unchanged. Musculoskeletal: No acute fracture or destructive bone lesion. Renal osteodystrophy changes. Multilevel thoracic  osteophytosis. Upper Abdomen: For findings below the diaphragm, please see the separately dictated CT of the abdomen and pelvis report, which was performed concurrently. Review of the MIP images confirms the above findings. IMPRESSION: 1. The segmental and subsegmental branches of the left lower lobe are not well opacified by the contrast bolus. Otherwise, no visualized pulmonary embolus in the remaining pulmonary arteries. 2. Severe cardiomegaly, unchanged, with superimposed findings of pulmonary edema. Small bilateral pleural effusions. 3. Right lower lobe pulmonary nodule measuring 4 mm, unchanged. Follow-up could be considered, as documented below. 4. For findings below the diaphragm, please see the separately dictated CT of the abdomen and pelvis report, which was performed concurrently. No follow-up needed if patient is low-risk.This recommendation follows the consensus statement: Guidelines for Management of Incidental Pulmonary Nodules Detected on CT Images: From the Fleischner Society 2017; Radiology 2017; 284:228-243. Aortic Atherosclerosis (ICD10-I70.0). Electronically Signed   By: Jodelle Mungo.D.  On: 04/17/2024 11:49   DG Chest 2 View Result Date: 04/17/2024 CLINICAL DATA:  Shortness of breath EXAM: CHEST - 2 VIEW COMPARISON:  01/23/2024 FINDINGS: Unchanged cardiac enlargement. Small bilateral pleural effusions, left greater than right. Diffuse increase interstitial markings noted bilaterally. Bibasilar atelectasis. Visualized osseous structures appear intact. IMPRESSION: Cardiomegaly, small bilateral pleural effusions and diffuse increase interstitial markings compatible with CHF. Electronically Signed   By: Kimberley Penman M.D.   On: 04/17/2024 06:20    Pertinent labs & imaging results that were available during my care of the patient were reviewed by me and considered in my medical decision making (see MDM for details).  Medications Ordered in ED Medications  amiodarone  (NEXTERONE ) 1.8  mg/mL load via infusion 150 mg (150 mg Intravenous Bolus from Bag 04/17/24 0731)    Followed by  amiodarone  (NEXTERONE  PREMIX) 360-4.14 MG/200ML-% (1.8 mg/mL) IV infusion (0 mg/hr Intravenous Stopped 04/17/24 1200)    Followed by  amiodarone  (NEXTERONE  PREMIX) 360-4.14 MG/200ML-% (1.8 mg/mL) IV infusion (30 mg/hr Intravenous New Bag/Given 04/17/24 1313)  heparin  bolus via infusion 2,000 Units (2,000 Units Intravenous Bolus from Bag 04/17/24 1234)    Followed by  heparin  ADULT infusion 100 units/mL (25000 units/250mL) (550 Units/hr Intravenous New Bag/Given 04/17/24 1229)  cinacalcet  (SENSIPAR ) tablet 30 mg (has no administration in time range)  sevelamer  carbonate (RENVELA ) tablet 2,400 mg (has no administration in time range)  midodrine  (PROAMATINE ) tablet 10 mg (has no administration in time range)  sodium chloride  flush (NS) 0.9 % injection 3 mL (has no administration in time range)  acetaminophen  (TYLENOL ) tablet 650 mg (has no administration in time range)    Or  acetaminophen  (TYLENOL ) suppository 650 mg (has no administration in time range)  ondansetron  (ZOFRAN ) tablet 4 mg (has no administration in time range)    Or  ondansetron  (ZOFRAN ) injection 4 mg (has no administration in time range)  hydrALAZINE  (APRESOLINE ) injection 5 mg (has no administration in time range)  midodrine  (PROAMATINE ) tablet 10 mg (10 mg Oral Given 04/17/24 0727)  dextrose  50 % solution 50 mL (50 mLs Intravenous Given 04/17/24 0723)  sodium zirconium cyclosilicate  (LOKELMA ) packet 10 g (10 g Oral Given 04/17/24 0810)  iohexol  (OMNIPAQUE ) 350 MG/ML injection 80 mL (75 mLs Intravenous Contrast Given 04/17/24 0917)                                                                                                                                     Procedures .Critical Care  Performed by: Teddi Favors, DO Authorized by: Teddi Favors, DO   Critical care provider statement:    Critical care time (minutes):  49    Critical care time was exclusive of:  Separately billable procedures and treating other patients   Critical care was necessary to treat or prevent imminent or life-threatening deterioration of the following conditions:  Respiratory failure   Critical care was time spent personally by me  on the following activities:  Development of treatment plan with patient or surrogate, discussions with consultants, evaluation of patient's response to treatment, examination of patient, ordering and review of laboratory studies, ordering and review of radiographic studies, ordering and performing treatments and interventions, pulse oximetry, re-evaluation of patient's condition, review of old charts and obtaining history from patient or surrogate   Care discussed with: admitting provider     (including critical care time)  Medical Decision Making / ED Course    Medical Decision Making:    MALASHA KLEPPE is a 43 y.o. female  with past medical history as below, significant for afib/aflutter, ESRD TThSa, polysubstance abuse, heart murmur, malnutrition who presents to the ED with complaint of cp/dib. The complaint involves an extensive differential diagnosis and also carries with it a high risk of complications and morbidity.  Serious etiology was considered. Ddx includes but is not limited to: In my evaluation of this patient's dyspnea my DDx includes, but is not limited to, pneumonia, pulmonary embolism, pneumothorax, pulmonary edema, metabolic acidosis, asthma, COPD, cardiac cause, anemia, anxiety, etc.  Differential includes all life-threatening causes for chest pain. This includes but is not exclusive to acute coronary syndrome, aortic dissection, pulmonary embolism, cardiac tamponade, community-acquired pneumonia, pericarditis, musculoskeletal chest wall pain, etc.   Complete initial physical exam performed, notably the patient was in mild distress, aflutter on tele.    Reviewed and confirmed nursing  documentation for past medical history, family history, social history.  Vital signs reviewed.     Clinical Course as of 04/17/24 1334  Mon Apr 17, 2024  0705 Troponin I (High Sensitivity)(!!): 285 chronic [SG]  0706 B Natriuretic Peptide(!): >4,500.0 chronic [SG]  0706 ECG Heart Rate(!): 118 aflutter [SG]  0728 Glucose-Capillary(!): 67 Give d50 and recheck [SG]  0728 ECG Heart Rate(!): 118 Aflutter, noncompliance w/ amio. Will give loading dose and start gtt [SG]  1029 Troponin I (High Sensitivity)(!!): 267 Improving  [SG]    Clinical Course User Index [SG] Teddi Favors, DO    Brief summary: 42 yo/f w/ hx as above including ESRD TTS, afib, polysubstance abuse here w/ cp/chest tight/dyspnea.   On arrival she is in atrial flutter, hypoxic on room air.  Improved with 4 L nasal cannula.  She is not compliant with her amiodarone .  Starting amiodarone  infusion.  Heart rate improving. She was hypoglycemic, given dextrose  w/ improvement Imaging concerning for volume overload, pulmonary edema. Patient denies missed HD sessions Recommend admission for rapid atrial flutter, hypoxia with pulmonary edema. Pt agreeable, dr patel accepting               Additional history obtained: -Additional history obtained from na -External records from outside source obtained and reviewed including: Chart review including previous notes, labs, imaging, consultation notes including  Prior admission Home meds Prior labs   Lab Tests: -I ordered, reviewed, and interpreted labs.   The pertinent results include:   Labs Reviewed  BASIC METABOLIC PANEL WITH GFR - Abnormal; Notable for the following components:      Result Value   Potassium 5.2 (*)    Chloride 87 (*)    CO2 18 (*)    Glucose, Bld 63 (*)    BUN 50 (*)    Creatinine, Ser 6.49 (*)    GFR, Estimated 8 (*)    Anion gap 32 (*)    All other components within normal limits  CBC WITH DIFFERENTIAL/PLATELET - Abnormal;  Notable for the following components:   nRBC  0.4 (*)    All other components within normal limits  BRAIN NATRIURETIC PEPTIDE - Abnormal; Notable for the following components:   B Natriuretic Peptide >4,500.0 (*)    All other components within normal limits  D-DIMER, QUANTITATIVE (NOT AT [facility]) - Abnormal; Notable for the following components:   D-Dimer, Quant 1.95 (*)    All other components within normal limits  CBG MONITORING, ED - Abnormal; Notable for the following components:   Glucose-Capillary 67 (*)    All other components within normal limits  CBG MONITORING, ED - Abnormal; Notable for the following components:   Glucose-Capillary 318 (*)    All other components within normal limits  CBG MONITORING, ED - Abnormal; Notable for the following components:   Glucose-Capillary 161 (*)    All other components within normal limits  CBG MONITORING, ED - Abnormal; Notable for the following components:   Glucose-Capillary 129 (*)    All other components within normal limits  TROPONIN I (HIGH SENSITIVITY) - Abnormal; Notable for the following components:   Troponin I (High Sensitivity) 285 (*)    All other components within normal limits  TROPONIN I (HIGH SENSITIVITY) - Abnormal; Notable for the following components:   Troponin I (High Sensitivity) 267 (*)    All other components within normal limits  RESP PANEL BY RT-PCR (RSV, FLU A&B, COVID)  [id number]  HEPATIC FUNCTION PANEL  HEPARIN  LEVEL (UNFRACTIONATED)  LACTIC ACID, PLASMA  LACTIC ACID, PLASMA  BETA-HYDROXYBUTYRIC ACID  ETHANOL  RAPID URINE DRUG SCREEN, HOSP PERFORMED  CBG MONITORING, ED  CBG MONITORING, ED  CBG MONITORING, ED  TROPONIN I (HIGH SENSITIVITY)    Notable for bnp+ trop + dimer +  EKG   EKG Interpretation Date/Time:  Monday April 17 2024 11:33:21 EDT Ventricular Rate:  106 PR Interval:  106 QRS Duration:  106 QT Interval:  368 QTC Calculation: 489 R Axis:   138  Text Interpretation: Probable right  ventricular hypertrophy Borderline ST elevation, anterior leads Baseline wander in lead(s) V6 Atrial flutter Confirmed by Russella Courts ([id number]) on 04/17/2024 11:36:04 AM         Imaging Studies ordered: I ordered imaging studies including CTPE CT a/p, cxr I independently visualized the following imaging with scope of interpretation limited to determining acute life threatening conditions related to emergency care; findings noted above I independently visualized and interpreted imaging. I agree with the radiologist interpretation   Medicines ordered and prescription drug management: Meds ordered this encounter  Medications   midodrine  (PROAMATINE ) tablet 10 mg   FOLLOWED BY Linked Order Group    amiodarone  (NEXTERONE ) 1.8 mg/mL load via infusion 150 mg    amiodarone  (NEXTERONE  PREMIX) 360-4.14 MG/200ML-% (1.8 mg/mL) IV infusion    amiodarone  (NEXTERONE  PREMIX) 360-4.14 MG/200ML-% (1.8 mg/mL) IV infusion   dextrose  50 % solution 50 mL   sodium zirconium cyclosilicate  (LOKELMA ) packet 10 g   iohexol  (OMNIPAQUE ) 350 MG/ML injection 80 mL   FOLLOWED BY Linked Order Group    heparin  bolus via infusion 2,000 Units    heparin  ADULT infusion 100 units/mL (25000 units/250mL)   cinacalcet  (SENSIPAR ) tablet 30 mg   sevelamer  carbonate (RENVELA ) tablet 2,400 mg   midodrine  (PROAMATINE ) tablet 10 mg   sodium chloride  flush (NS) 0.9 % injection 3 mL   OR Linked Order Group    acetaminophen  (TYLENOL ) tablet 650 mg    acetaminophen  (TYLENOL ) suppository 650 mg   OR Linked Order Group    ondansetron  (ZOFRAN ) tablet 4 mg  ondansetron  (ZOFRAN ) injection 4 mg   hydrALAZINE  (APRESOLINE ) injection 5 mg    -I have reviewed the patients home medicines and have made adjustments as needed   Consultations Obtained: I requested consultation with the hospitalist,  and discussed lab and imaging findings as well as pertinent plan    Cardiac Monitoring: The patient was maintained on a cardiac monitor.   I personally viewed and interpreted the cardiac monitored which showed an underlying rhythm of: aflutter Continuous pulse oximetry interpreted by myself, 97% on 4L.    Social Determinants of Health:  Diagnosis or treatment significantly limited by social determinants of health: current smoker and polysubstance abuse Counseled patient for approximately 3 minutes regarding smoking cessation. Discussed risks of smoking and how they applied and affected their visit here today. Patient not ready to quit at this time, however will follow up with their primary doctor when they are.   CPT code: 16109: intermediate counseling for smoking cessation     Reevaluation: After the interventions noted above, I reevaluated the patient and found that they have improved  Co morbidities that complicate the patient evaluation  Past Medical History:  Diagnosis Date   Anemia    low iron  - receives iron  at dialysis   Anxiety    Arthritis    RA   Atrial fibrillation with RVR (HCC) 12/14/2023   Chronic systolic congestive heart failure (HCC) 03/16/2016   Dyspnea    ESRD (end stage renal disease) (HCC)    [facility] _ [place]   H/O pericarditis 01/17/2013   H/O pleural effusion 01/17/2013   Heart murmur    Lupus (systemic lupus erythematosus) (HCC)    Previously followed with Dr. 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 pneumonae bacteremia on 01/21/2013. Sensitive to Peniccilin       Dispostion: Disposition  decision including need for hospitalization was considered, and patient admitted to the hospital.    Final Clinical Impression(s) / ED Diagnoses Final diagnoses:  Pulmonary nodule  Atrial flutter with rapid ventricular response (HCC)  Chronic respiratory failure, unspecified whether with hypoxia or hypercapnia (HCC)  Acute pulmonary edema (HCC)  Hyperkalemia  ESRD on hemodialysis (HCC)  Hypoglycemia        Teddi Favors, DO 04/17/24 1334

## 2024-04-17 NOTE — Consult Note (Addendum)
 Cardiology Consultation   Patient ID: [name] MRN: [id number]; DOB: December 19, 1981  Admit date: 04/17/2024 Date of Consult: 04/17/2024  PCP:  Senaida Dama, NP   [facility] Providers Cardiologist:  Sheryle Donning, MD       Patient Profile:   [name] is a 43 y.o. female with a hx of ESRD on HD, polysubstance abuse, atrial fib/flutter, SLE, hypotension on midodrine , HFrEF, mitral stenosis, mitral regurgitation, aortic regurgitation who is being seen 04/17/2024 for the evaluation of atrial fibrillation at the request of Dr. Lydia Sams.  History of Present Illness:   [name] is a 43 year old female with above medical history.  Per chart review, patient was seen by Dr. Veryl Gottron in 10/2020 for evaluation of abnormal echocardiogram.  Echocardiogram at that time showed EF 30-35%, moderate LVH, low normal RV systolic function, moderate-severe mitral valve regurgitation, moderate mitral stenosis.  A TEE was recommended, patient preferred to have this completed as an outpatient.  Unfortunately, patient was lost to follow-up.  Patient later seen in 11/2022.  At that time, she was admitted with worsening shortness of breath.  She underwent echocardiogram on 11/30/2022 that showed EF 35-40%, mild LVH, moderately reduced RV systolic function, moderately elevated PA systolic pressure, severe MR, moderate MS, moderate-severe TR, severe AI.  Patient underwent dialysis with improvement in symptoms.  She was not felt to be a candidate for surgical repair.  In 11/2023, patient was again admitted with shortness of breath and was found to be septic. Also found to be in new onset atrial flutter.  Echocardiogram on 12/14/2023 showed EF 30-35%, moderately reduced RV systolic function, severe left atrial enlargement, moderate mitral valve regurgitation, mild mitral valve stenosis, moderate-severe tricuspid valve regurgitation, moderate-severe aortic valve regurgitation.  Again, patient was  not felt to be candidate for surgical repair of her valvular issues.  Her volume is managed by dialysis.  Her blood pressure was low so she was unable to be started on AV nodal medications for rate control.  With her severe left atrial enlargement and valvular disease, it was not felt that cardioversion would be helpful.  At that time, it was recommended that patient be seen by palliative care, possibly hospice.  She was managed with supportive care.  She was not felt to be a good candidate for anticoagulation with her history of subdural hematoma and recent/recurrent falls.  Treated with amiodarone  and metoprolol   Patient recently admitted 1/26 - 01/30/2024. She had presented to [facility] from her nursing home with increasing shortness of breath. Found to be in atrial flutter with HR in the 150s. Treated with oral amiodarone , metoprolol . She converted to NSR and cardiology signed off.   Patient presented to the ED on 4/21 complaining of shortness of breath that had been going on for 2-3 weeks. Also reported having some chest tightness with coughing, and a productive yellow thick phlegm. Initial vital signs in the ED showed BP 102/66, HR 118 BPM, oxygen 100% on 3 L via [place]. Labs significant for BNP >4500. hsTn 285>267. COVID, flu, RSV negative. CXR showed cardiomegaly, small bilateral pleural effusions, diffuse increase interstitial markings. CTA chest showed no visualized PE, severe cardiomegaly (unchanged) with superimposed findings of pulmonary edema, small bilateral pleural effusions.   Patient was started on IV amiodarone  and heparin . She was seen by nephrology and there are plans to dialyze as soon as possible.   On interview, patient reports that she came to the ED due to shortness of breath. She undergoes HD MWF,  last had dialysis on Friday. She Denies chest pain. Has a bit of a cough, if she coughs or breathes deeply, she can feel palpitations. She was not taking any blood thinners prior to admission. She  was suppose to be on oral amiodarone , but admits that she has missed a few doses   Past Medical History:  Diagnosis Date   Anemia    low iron  - receives iron  at dialysis   Anxiety    Arthritis    RA   Atrial fibrillation with RVR (HCC) 12/14/2023   Chronic systolic congestive heart failure (HCC) 03/16/2016   Dyspnea    ESRD (end stage renal disease) (HCC)    [facility] _ [place]   H/O pericarditis 01/17/2013   H/O pleural effusion 01/17/2013   Heart murmur    Lupus (systemic lupus erythematosus) (HCC)    Previously followed with Dr. 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 pneumonae bacteremia on 01/21/2013. Sensitive to Peniccilin     Past Surgical History:  Procedure Laterality Date   A/V FISTULAGRAM N/A 12/15/2023   Procedure: A/V Fistulagram;  Surgeon: Patrick Boor, MD;  Location: [facility] INVASIVE CV LAB;  Service: Cardiovascular;  Laterality: N/A;   AV FISTULA PLACEMENT     AV FISTULA PLACEMENT Right 08/01/2020   Procedure: RIGHT ARM BRACHIOCEPHALIC  ARTERIOVENOUS (AV) FISTULA CREATION;  Surgeon: Mayo Speck, MD;  Location: [facility] OR;  Service: Vascular;  Laterality: Right;   BASCILIC VEIN TRANSPOSITION Left 02/05/2014   Procedure: BASCILIC VEIN TRANSPOSITION;  Surgeon: Mayo Speck, MD;  Location: [facility] OR;  Service: Vascular;  Laterality: Left;   BASCILIC VEIN TRANSPOSITION Right 01/08/2021   Procedure: RIGHT ARM SECOND STAGE BASCILIC VEIN TRANSPOSITION;  Surgeon: Mayo Speck, MD;  Location: [facility] OR;   Service: Vascular;  Laterality: Right;   BIOPSY  12/17/2021   Procedure: BIOPSY;  Surgeon: Normie Becton., MD;  Location: [facility] ENDOSCOPY;  Service: Gastroenterology;;   ESOPHAGOGASTRODUODENOSCOPY (EGD) WITH PROPOFOL  N/A 12/17/2021   Procedure: ESOPHAGOGASTRODUODENOSCOPY (EGD) WITH PROPOFOL ;  Surgeon: Normie Becton., MD;  Location: [facility] ENDOSCOPY;  Service: Gastroenterology;  Laterality: N/A;   FISTULA SUPERFICIALIZATION Left 05/30/2018   Procedure: FISTULA PLICATION BASILIC VEIN TRANSPOSITION;  Surgeon: Dannis Dy, MD;  Location: [facility] OR;  Service: Vascular;  Laterality: Left;   FISTULA SUPERFICIALIZATION Left 12/18/2019   Procedure: PLICATION OF LEFT ARTERIOVENOUS FISTULA ULCER;  Surgeon: Mayo Speck, MD;  Location: [facility] OR;  Service: Vascular;  Laterality: Left;   I & D EXTREMITY Right 02/18/2021   Procedure: IRRIGATION AND DEBRIDEMENT OF ARM;  Surgeon: Dannis Dy, MD;  Location: [facility] OR;  Service: Vascular;  Laterality: Right;   INSERTION OF DIALYSIS CATHETER N/A 05/19/2020   Procedure: TUNNELED INSERTION  OF DIALYSIS CATHETER;  Surgeon: Adine Hoof, MD;  Location: [facility] OR;  Service: Vascular;  Laterality: N/A;   PERIPHERAL VASCULAR BALLOON ANGIOPLASTY  12/15/2023   Procedure: PERIPHERAL VASCULAR BALLOON ANGIOPLASTY;  Surgeon: Patrick Boor, MD;  Location:  [facility] INVASIVE CV LAB;  Service: Cardiovascular;;   THROMBECTOMY AND REVISION OF ARTERIOVENTOUS (AV) GORETEX  GRAFT Left 07/28/2020   Procedure: Oversewing of left arm Brachial cephalic fistula for bleeding.;  Surgeon: Dannis Dy, MD;  Location: [facility] OR;  Service: Vascular;  Laterality: Left;   VENOGRAM Right 01/31/2014   Procedure: DIALYSIS CATHETER;  Surgeon: Margherita Shell, MD;  Location: [facility] CATH LAB;  Service: Cardiovascular;  Laterality: Right;     Home Medications:  Prior to Admission medications   Medication Sig Start Date End Date Taking? Authorizing Provider  midodrine  (PROAMATINE ) 10  MG tablet Take 1 tablet (10 mg total) by mouth 3 (three) times daily with meals. Patient not taking: Reported on 04/17/2024 01/30/24   Loma Rising, MD  sevelamer  carbonate (RENVELA ) 800 MG tablet Take 3 tablets (2,400 mg total) by mouth 3 (three) times daily. Patient not taking: Reported on 04/17/2024 01/30/24   Loma Rising, MD    Inpatient Medications: Scheduled Meds:  cinacalcet   30 mg Oral QPM   midodrine   10 mg Oral TID WC   sevelamer  carbonate  2,400 mg Oral TID with meals   sodium chloride  flush  3 mL Intravenous Q12H   Continuous Infusions:  amiodarone  30 mg/hr (04/17/24 1313)   heparin  550 Units/hr (04/17/24 1229)   PRN Meds: acetaminophen  **OR** acetaminophen , hydrALAZINE , ondansetron  **OR** ondansetron  (ZOFRAN ) IV  Allergies:    Allergies  Allergen Reactions   Cephalosporins Rash    To both keflex  and cefazolin .  Potential bullous lesion from Ceftriaxone  11/2023.   Tobramycin Sulfate Swelling    Eye swelling   Vancomycin  Swelling    Social History:   Social History   Socioeconomic History   Marital status: Single    Spouse name: Not on file   Number of children: Not on file   Years of education: Not on file   Highest education level: Not on file  Occupational History   Not on file  Tobacco Use   Smoking status: Every Day    Current packs/day: 0.12    Average packs/day: 0.1 packs/day for 15.0 years (1.8 ttl pk-yrs)    Types: Cigarettes    Passive exposure: Current   Smokeless tobacco: Never   Tobacco comments:    4 cigarettes per day  Vaping Use   Vaping status: Never Used  Substance and Sexual Activity   Alcohol  use: Yes    Alcohol /week: 0.0 standard drinks of alcohol     Comment: Special Occasional takes Vicar   Drug use: Yes    Types: Marijuana    Comment: ocassional, last time- 12/21/20   Sexual activity: Not Currently    Birth control/protection: None  Other Topics Concern   Not on file  Social History Narrative   Not on file   Social  Drivers of Health   Financial Resource Strain: Not on file  Food Insecurity: Patient Declined (01/25/2024)   Hunger Vital Sign    Worried About Running Out of Food in the Last Year: Patient declined    Ran Out of Food in the Last Year: Patient declined  Transportation Needs: Patient Declined (01/25/2024)   PRAPARE - Administrator, Civil Service (Medical): Patient declined    Lack of Transportation (Non-Medical): Patient declined  Physical Activity: Not on file  Stress: Not on file  Social Connections: Not on file  Intimate Partner Violence: Patient Declined (01/25/2024)   Humiliation, Afraid, Rape, and Kick questionnaire    Fear of Current or Ex-Partner: Patient  declined    Emotionally Abused: Patient declined    Physically Abused: Patient declined    Sexually Abused: Patient declined    Family History:   History reviewed. No pertinent family history.   ROS:  Please see the history of present illness.   All other ROS reviewed and negative.     Physical Exam/Data:   Vitals:   04/17/24 1220 04/17/24 1300 04/17/24 1400 04/17/24 1450  BP: 106/63 109/66 (!) 107/52 98/63  Pulse: (!) 109 (!) 107 (!) 106   Resp: (!) 29 (!) 22 (!) 29 (!) 23  Temp: 97.7 F (36.5 C)     TempSrc:      SpO2: 100% 98% 99%   Weight:      Height:       No intake or output data in the 24 hours ending 04/17/24 1646    04/17/2024    5:26 AM 01/29/2024    1:21 PM 01/29/2024    4:44 AM  Last 3 Weights  Weight (lbs) 85 lb 15.7 oz 90 lb 2.7 oz 89 lb 4.8 oz  Weight (kg) 39 kg 40.9 kg 40.506 kg     Body mass index is 15.23 kg/m.  General:  Well nourished, well developed, in no acute distress. Sitting upright in the bed  HEENT: normal Neck: no JVD Vascular: Radial pulses 2+ bilaterally Cardiac:  normal S1, S2; regular rhythm, tachycardic. Grade 3/6 systolic and grade 2/6 diastolic murmurs present  Lungs:  crackles in bilateral lung bases  Abd: soft, nontender  Ext: no edema in BLE   Musculoskeletal:  No deformities, BUE and BLE strength normal and equal Skin: warm and dry  Neuro:  CNs 2-12 intact, no focal abnormalities noted Psych:  Normal affect   EKG:  The EKG was personally reviewed and demonstrates:  atrial flutter with HR 118 BPM  Telemetry:  Telemetry was personally reviewed and demonstrates:  Atrial flutter, HR in the 100s   Relevant CV Studies: Cardiac Studies & Procedures   ______________________________________________________________________________________________     ECHOCARDIOGRAM  ECHOCARDIOGRAM COMPLETE 12/14/2023  Narrative ECHOCARDIOGRAM REPORT    Patient Name:   [name] Date of Exam: 12/14/2023 Medical Rec #:  [id number]      Height:       63.0 in Accession #:    [id number]     Weight:       122.8 lb Date of Birth:  02/22/1981     BSA:          1.572 m Patient Age:    42 years       BP:           85/56 mmHg Patient Gender: F              HR:           112 bpm. Exam Location:  Inpatient  Procedure: 2D Echo, Color Doppler and Cardiac Doppler  Indications:    CHF I50.21  History:        Patient has prior history of Echocardiogram examinations, most recent 11/30/2022. CHF. Polysubstance abuse, Lupus.  Sonographer:    Hersey Lorenzo RDCS Referring Phys: RAYANN N ATWAY  IMPRESSIONS   1. Left ventricular ejection fraction, by estimation, is 30 to 35%. The left ventricle has moderately decreased function. The left ventricle demonstrates global hypokinesis. The left ventricular internal cavity size was mildly dilated. Left ventricular diastolic parameters are indeterminate. 2. Right ventricular systolic function is moderately reduced. The right ventricular size is mildly enlarged. There  is normal pulmonary artery systolic pressure. The estimated right ventricular systolic pressure is 35.0 mmHg. 3. Left atrial size was severely dilated. 4. Right atrial size was moderately dilated. 5. Thickened and calcified mitral chords,  severely calcified leaflets. The mitral valve is rheumatic. Moderate mitral valve regurgitation. Mild mitral stenosis. The mean mitral valve gradient is 4.0 mmHg. Moderate mitral annular calcification. 6. Tricuspid valve regurgitation is moderate to severe. Visually moderate TR but hepatic vein systolic flow reversal suggests possible severe TR 7. The aortic valve is tricuspid. There is moderate calcification of the aortic valve. Aortic valve regurgitation is moderate to severe, highly eccentric. Aortic valve sclerosis/calcification is present, without any evidence of aortic stenosis. 8. The inferior vena cava is dilated in size with >50% respiratory variability, suggesting right atrial pressure of 8 mmHg.  FINDINGS Left Ventricle: Left ventricular ejection fraction, by estimation, is 30 to 35%. The left ventricle has moderately decreased function. The left ventricle demonstrates global hypokinesis. The left ventricular internal cavity size was mildly dilated. There is no left ventricular hypertrophy. Left ventricular diastolic parameters are indeterminate.  Right Ventricle: The right ventricular size is mildly enlarged. No increase in right ventricular wall thickness. Right ventricular systolic function is moderately reduced. There is normal pulmonary artery systolic pressure. The tricuspid regurgitant velocity is 2.60 m/s, and with an assumed right atrial pressure of 8 mmHg, the estimated right ventricular systolic pressure is 35.0 mmHg.  Left Atrium: Left atrial size was severely dilated.  Right Atrium: Right atrial size was moderately dilated.  Pericardium: There is no evidence of pericardial effusion.  Mitral Valve: Thickened and calcified mitral chords, severely calcified leaflets. The mitral valve is rheumatic. There is severe calcification of the mitral valve leaflet(s). Moderate mitral annular calcification. Moderate mitral valve regurgitation. Mild mitral valve stenosis. The mean mitral  valve gradient is 4.0 mmHg.  Tricuspid Valve: The tricuspid valve is normal in structure. Tricuspid valve regurgitation is moderate to severe. The flow in the hepatic veins is reversed during ventricular systole.  Aortic Valve: The aortic valve is tricuspid. There is moderate calcification of the aortic valve. Aortic valve regurgitation is moderate to severe. Aortic valve sclerosis/calcification is present, without any evidence of aortic stenosis.  Pulmonic Valve: The pulmonic valve was normal in structure. Pulmonic valve regurgitation is trivial.  Aorta: The aortic root is normal in size and structure.  Venous: The inferior vena cava is dilated in size with greater than 50% respiratory variability, suggesting right atrial pressure of 8 mmHg.  IAS/Shunts: No atrial level shunt detected by color flow Doppler.   LEFT VENTRICLE PLAX 2D LVIDd:         5.70 cm LVIDs:         4.80 cm LV PW:         1.00 cm LV IVS:        1.00 cm LVOT diam:     2.00 cm LV SV:         47 LV SV Index:   30 LVOT Area:     3.14 cm  LV Volumes (MOD) LV vol d, MOD A4C: 176.0 ml LV vol s, MOD A4C: 142.0 ml LV SV MOD A4C:     176.0 ml  RIGHT VENTRICLE            IVC RV S prime:     8.70 cm/s  IVC diam: 2.00 cm TAPSE (M-mode): 0.8 cm  LEFT ATRIUM              Index  RIGHT ATRIUM           Index LA diam:        4.40 cm  2.80 cm/m   RA Area:     26.50 cm LA Vol (A2C):   134.0 ml 85.26 ml/m  RA Volume:   98.40 ml  62.61 ml/m LA Vol (A4C):   120.0 ml 76.36 ml/m LA Biplane Vol: 133.0 ml 84.63 ml/m AORTIC VALVE LVOT Vmax:   100.00 cm/s LVOT Vmean:  67.000 cm/s LVOT VTI:    0.149 m  AORTA Ao Root diam: 3.00 cm Ao Asc diam:  2.80 cm  MITRAL VALVE           TRICUSPID VALVE MV Mean grad: 4.0 mmHg TR Peak grad:   27.0 mmHg TR Vmax:        260.00 cm/s  SHUNTS Systemic VTI:  0.15 m Systemic Diam: 2.00 cm  Dalton McleanMD Electronically signed by Archer Bear Signature Date/Time:  12/14/2023/2:18:46 PM    Final          ______________________________________________________________________________________________       Laboratory Data:  High Sensitivity Troponin:   Recent Labs  Lab 04/17/24 0558 04/17/24 0758 04/17/24 1327  TROPONINIHS 285* 267* 225*     Chemistry Recent Labs  Lab 04/17/24 0555  NA 137  K 5.2*  CL 87*  CO2 18*  GLUCOSE 63*  BUN 50*  CREATININE 6.49*  CALCIUM  10.1  GFRNONAA 8*  ANIONGAP 32*    Recent Labs  Lab 04/17/24 0758  PROT 8.1  ALBUMIN  2.6*  AST 39  ALT 23  ALKPHOS 352*  BILITOT 1.3*   Lipids No results for input(s): "CHOL", "TRIG", "HDL", "LABVLDL", "LDLCALC", "CHOLHDL" in the last 168 hours.  Hematology Recent Labs  Lab 04/17/24 0555  WBC 5.2  RBC 4.57  HGB 12.7  HCT 38.5  MCV 84.2  MCH 27.8  MCHC 33.0  RDW 15.0  PLT 175   Thyroid No results for input(s): "TSH", "FREET4" in the last 168 hours.  BNP Recent Labs  Lab 04/17/24 0555  BNP >4,500.0*    DDimer  Recent Labs  Lab 04/17/24 0612  DDIMER 1.95*     Radiology/Studies:  CT ABDOMEN PELVIS W CONTRAST Result Date: 04/17/2024 CLINICAL DATA:  Abdominal pain, acute, nonlocalized EXAM: CT ABDOMEN AND PELVIS WITH CONTRAST TECHNIQUE: Multidetector CT imaging of the abdomen and pelvis was performed using the standard protocol following bolus administration of intravenous contrast. RADIATION DOSE REDUCTION: This exam was performed according to the departmental dose-optimization program which includes automated exposure control, adjustment of the mA and/or kV according to patient size and/or use of iterative reconstruction technique. CONTRAST:  75mL OMNIPAQUE  IOHEXOL  350 MG/ML SOLN COMPARISON:  December 15, 2023 FINDINGS: Lower chest: For findings above the diaphragm, please see the separately dictated CT of the chest report, which was performed concurrently. Hepatobiliary: No mass. Reflux of contrast into the hepatic veins. Heterogeneous  enhancement of the left hepatic lobe and inferior right hepatic tip.No radiopaque stones or wall thickening of the gallbladder.No intrahepatic or extrahepatic biliary ductal dilation. Pancreas: No mass or main ductal dilation.No peripancreatic inflammation or fluid collection. Spleen: No mass. Heterogeneous enhancement throughout the subcapsular spleen, likely due to the phase of contrast. Adrenals/Urinary Tract: No adrenal masses. Severe renal atrophy bilaterally. No mass. No nephrolithiasis or hydronephrosis. Decompressed urinary bladder without visualized focal abnormality. Stomach/Bowel: The stomach is decompressed without focal abnormality. No small bowel wall thickening or inflammation. No small bowel obstruction. Normal appendix. Vascular/Lymphatic: No aortic aneurysm.  No intraabdominal or pelvic lymphadenopathy. Reproductive: The uterus and ovaries are within normal limits for patient's age.No free pelvic fluid. Other: No pneumoperitoneum. Small volume ascites with diffuse mesenteric edema. Musculoskeletal: No acute fracture or destructive lesion.Renal osteodystrophy changes. 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 IMPRESSION: 1. No acute intra-abdominal or pelvic abnormality. 2. Small volume ascites with diffuse mesenteric edema, likely related to the patient's volume status. 3. Reflux of contrast into the hepatic veins, consistent with underlying cardiac dysfunction. Heterogeneous enhancement of the left hepatic lobe and inferior right hepatic tip is also likely related to the cardiac dysfunction (congestive hepatopathy). Correlation with liver enzymes recommended to exclude acute hepatitis. Electronically Signed   By: Rance Burrows M.D.   On: 04/17/2024 12:04   CT Angio Chest PE W and/or Wo Contrast Result Date: 04/17/2024 CLINICAL DATA:  Pulmonary embolism (PE) suspected, high prob EXAM: CT ANGIOGRAPHY CHEST WITH CONTRAST TECHNIQUE: Multidetector CT imaging of the chest was performed  using the standard protocol during bolus administration of intravenous contrast. Multiplanar CT image reconstructions and MIPs were obtained to evaluate the vascular anatomy. RADIATION DOSE REDUCTION: This exam was performed according to the departmental dose-optimization program which includes automated exposure control, adjustment of the mA and/or kV according to patient size and/or use of iterative reconstruction technique. CONTRAST:  75mL OMNIPAQUE  IOHEXOL  350 MG/ML SOLN COMPARISON:  January 08, 2024 FINDINGS: Pulmonary Embolism: The segmental and subsegmental branches of the left lower lobe are not well opacified by the contrast bolus. Otherwise, no visualized pulmonary embolus in the remaining pulmonary arteries. Cardiovascular: Severe cardiomegaly. No aortic aneurysm. Diffuse aortic atherosclerosis. Extensive multi-vessel coronary atherosclerosis. Mediastinum/Nodes: No mediastinal mass. Enlarged multi station mediastinal and bilateral hilar lymph nodes, unchanged, likely reactive. Lungs/Pleura: The trachea is midline and patent. Mild diffuse bronchial wall thickening with interlobular septal thickening throughout the lungs. No lobar airspace consolidation or pneumothorax. Small bilateral pleural effusions with hazy ground-glass attenuation throughout the lungs. 4 mm right lower lobe nodule (axial 88), unchanged. Musculoskeletal: No acute fracture or destructive bone lesion. Renal osteodystrophy changes. Multilevel thoracic osteophytosis. Upper Abdomen: For findings below the diaphragm, please see the separately dictated CT of the abdomen and pelvis report, which was performed concurrently. Review of the MIP images confirms the above findings. IMPRESSION: 1. The segmental and subsegmental branches of the left lower lobe are not well opacified by the contrast bolus. Otherwise, no visualized pulmonary embolus in the remaining pulmonary arteries. 2. Severe cardiomegaly, unchanged, with superimposed findings of  pulmonary edema. Small bilateral pleural effusions. 3. Right lower lobe pulmonary nodule measuring 4 mm, unchanged. Follow-up could be considered, as documented below. 4. For findings below the diaphragm, please see the separately dictated CT of the abdomen and pelvis report, which was performed concurrently. No follow-up needed if patient is low-risk.This recommendation follows the consensus statement: Guidelines for Management of Incidental Pulmonary Nodules Detected on CT Images: From the Fleischner Society 2017; Radiology 2017; 284:228-243. Aortic Atherosclerosis (ICD10-I70.0). Electronically Signed   By: Rance Burrows M.D.   On: 04/17/2024 11:49   DG Chest 2 View Result Date: 04/17/2024 CLINICAL DATA:  Shortness of breath EXAM: CHEST - 2 VIEW COMPARISON:  01/23/2024 FINDINGS: Unchanged cardiac enlargement. Small bilateral pleural effusions, left greater than right. Diffuse increase interstitial markings noted bilaterally. Bibasilar atelectasis. Visualized osseous structures appear intact. IMPRESSION: Cardiomegaly, small bilateral pleural effusions and diffuse increase interstitial markings compatible with CHF. Electronically Signed   By: Kimberley Penman M.D.   On: 04/17/2024 06:20     Assessment and  Plan:   Atrial Flutter with RVR  - Prior to admission, patient was on oral amiodarone .  Has not been on anticoagulation due to her history of subdural hematoma in 11/2022, recurrent falls.  She admits to missing a couple of doses of amiodarone  prior to admission - Patient presented with shortness of breath, found to be in atrial flutter with RVR.  Now on IV heparin , IV amiodarone  - Heart rate has improved, now in the 100s.  She remains in atrial flutter - Patient has low BP and is on midodrine  10 mg 3 times daily.  Unable to titrate AV nodal medications for rate control - Okay to continue IV amiodarone  for now.  Suspect that when she gets dialysis and her volume status improves, her heart rate will  continue to improve. - Anticipate transition to p.o. amiodarone  after she undergoes dialysis  Acute on Chronic Systolic Heart Failure  - Echocardiogram from 11/2023 showed EF 30-35%, moderately reduced RV systolic function - She has not able to tolerate GDMT due to low blood pressure, end-stage renal disease - BNP elevated to greater than 4500.  She is grossly volume overloaded on exam - Patient does not make urine.  Needs undergo dialysis for volume management  Elevated troponin - High-sensitivity troponin 285, 267, 225 - Patient denies chest pain - Suspect demand ischemia in the setting of volume overload, end-stage renal disease - No plans for ischemic evaluation at this time  Moderate mitral valve regurgitation Mild mitral stenosis Moderate-severe tricuspid valve regurgitation Moderate-severe aortic valve regurgitation - Noted on echocardiogram from 11/2023 - Patient not a candidate for any type of valve repair or replacement.  Otherwise per primary  - ESRD on HD  - Chronic wound on her right thigh   Risk Assessment/Risk Scores:    New York  Heart Association (NYHA) Functional Class NYHA Class IV  CHA2DS2-VASc Score = 3   This indicates a 3.2% annual risk of stroke. The patient's score is based upon: CHF History: 1 HTN History: 1 Diabetes History: 0 Stroke History: 0 Vascular Disease History: 0 Age Score: 0 Gender Score: 1  For questions or updates, please contact [facility] Please consult [email or web address] for contact info under  Signed, Debria Fang, PA-C  04/17/2024 4:46 PM Patient seen and examined, note reviewed with the signed Advanced Practice Provider. I personally reviewed laboratory data, imaging studies and relevant notes. I independently examined the patient and formulated the important aspects of the plan. I have personally discussed the plan with the patient and/or family. Comments or changes to the note/plan are indicated below.  She  was seen by her bedside in the ED, nephrologist and bedside nurse present.   Acute on chronic Systolic heart failure  Atrial flutter with RVR  Elevated troponin suspect demand ischemia in the setting of volume overload end-stage renal failure-trops flat Valvular heart disease: Moderate mitral regurgitation, moderate to severe tricuspid regurgitation, moderate to severe aortic valve regurgitation (previous discussion patient had a candidate for valve replacement) ESRD  Clinically she appears to be significantly volume overloaded.  She needs urgent dialysis which while present in the room the nephrologist with scheduling the patient to be next for hemodialysis. In terms of her atrial flutter she is with rapid ventricular rate she has been started on amiodarone  drip we will continue this.  Her blood pressure is marginal cannot had any other rate lowering agents at this time.  If we months and have no other option eventually then we might consider low-dose of  digoxin.  But for right now we will hold off on that.  Heart rates in the low 100s which I think may improve when she is dialyzed.  She is on midodrine  to support her blood pressure please continue this. Troponins is elevated but mostly flat, she is not experiencing any chest discomfort at this time there is no clinical indication to pursue an ischemic evaluation at this time.  She has had a great deal of discussion in the past for anticoagulation and shared decision had been made in the past to take her off anticoagulation due to subdural hematoma and recurrent/recent falls.  There also been a great deal of discussion for her valvular heart disease and the patient has been deemed not a candidate for valvular intervention.   Will continue to follow with you.   Audyn Dimercurio DO, MS [facility] Attending Cardiologist [facility]  [address] [place], [place] [address] [phone] Website: [email or web address]

## 2024-04-17 NOTE — Progress Notes (Addendum)
 PHARMACY - ANTICOAGULATION CONSULT NOTE  Pharmacy Consult for heparin  Indication: atrial fibrillation  Allergies  Allergen Reactions   Cephalosporins Rash    To both keflex  and cefazolin .  Potential bullous lesion from Ceftriaxone  11/2023.   Tobramycin Sulfate Swelling    Eye swelling   Vancomycin  Swelling    Patient Measurements: Height: 5\' 3"  (160 cm) Weight: 39 kg (85 lb 15.7 oz) IBW/kg (Calculated) : 52.4 HEPARIN  DW (KG): 39  Vital Signs: Temp: 97.5 F (36.4 C) (04/21 0927) Temp Source: Oral (04/21 0525) BP: 109/65 (04/21 1030) Pulse Rate: 108 (04/21 1030)  Labs: Recent Labs    04/17/24 0555 04/17/24 0558 04/17/24 0758  HGB 12.7  --   --   HCT 38.5  --   --   PLT 175  --   --   CREATININE 6.49*  --   --   TROPONINIHS  --  285* 267*    Estimated Creatinine Clearance: 7.1 mL/min (A) (by C-G formula based on SCr of 6.49 mg/dL (H)).   Medical History: Past Medical History:  Diagnosis Date   Anemia    low iron  - receives iron  at dialysis   Anxiety    Arthritis    RA   Atrial fibrillation with RVR (HCC) 12/14/2023   Chronic systolic congestive heart failure (HCC) 03/16/2016   Dyspnea    ESRD (end stage renal disease) (HCC)    [facility] _ [place]   H/O pericarditis 01/17/2013   H/O pleural effusion 01/17/2013   Heart murmur    Lupus (systemic lupus erythematosus) (HCC)    Previously followed with Dr. 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 pneumonae bacteremia on  01/21/2013. Sensitive to Peniccilin     Medications:  (Not in a hospital admission)  Scheduled:   Assessment: 60 YOF with a history of atrial fibrillation/flutter. Patient not on anticoagulation prior to hospitalization. Pharmacy consulted for heparin  dosing.   4/21: Hgb 12.7, PLT 175  Goal of Therapy:  Heparin  level 0.3-0.7 units/ml Monitor platelets by anticoagulation protocol: Yes   Plan:  Heparin  2000 units x1, then heparin  550 units/hr Monitor heparin  level in 6 hours Monitor CBC and s/sx bleeding daily F/u long term anticoagulation plan  Volney Grumbles, PharmD PGY-1 Acute Care Pharmacy Resident 04/17/2024 12:10 PM

## 2024-04-17 NOTE — Consult Note (Addendum)
 WOC Nurse Consult Note: this patient is familiar to [facility] team from previous admission; Dr. Julio Ohm evaluated this wound that admission 01/23/2024 with no concerns for surgical intervention needed.  Patient seen at [facility] 03/17/2024, wound debrided and ordered Hydrofera Blue; Hydrofera Blue is not on formulary at [facility]; silver hydrofiber Timm Foot [phone]) used as substitute  Reason for Consult: R upper thigh wound  Wound type: full thickness unknown etiology, per patient came up as blister (patient end stage renal but ortho nor WCC overly concerned for calciphylaxis at time of their evaluation)  Pressure Injury POA: NA  Measurement: see nursing flowsheet (per [facility] 03/17/2024 6.7 cm x 2.5 cm x 0.2 cm)  Wound bed: mix of healthy granulation tissue and yellow slough per [facility] note 03/17/2024  Drainage (amount, consistency, odor)  Periwound: Dressing procedure/placement/frequency: Cleanse R upper thigh wound with Vashe wound cleanser Timm Foot [phone]), do not rinse and allow to air dry.  Apply silver hydrofiber ([place] [phone]) to wound bed daily, cover with dry gauze and silicone foam or ABD pad and tape whichever is preferred.   Per [facility] patient was scheduled for follow-up there around 3/28, it does not appear follow-up visits have been made.  Patient should continue to follow with wound care center post discharge for ongoing management of this wound as likely will require serial debridements.    POC discussed with bedside nurse. WOC team will not follow. Re-consult if further needs arise.   Thank you,    Ronni Colace MSN, RN-BC, [employer] [phone]

## 2024-04-18 DIAGNOSIS — I5033 Acute on chronic diastolic (congestive) heart failure: Secondary | ICD-10-CM

## 2024-04-18 DIAGNOSIS — I4892 Unspecified atrial flutter: Secondary | ICD-10-CM

## 2024-04-18 DIAGNOSIS — J9601 Acute respiratory failure with hypoxia: Secondary | ICD-10-CM

## 2024-04-18 LAB — GLUCOSE, CAPILLARY
Glucose-Capillary: 100 mg/dL — ABNORMAL HIGH (ref 70–99)
Glucose-Capillary: 102 mg/dL — ABNORMAL HIGH (ref 70–99)
Glucose-Capillary: 110 mg/dL — ABNORMAL HIGH (ref 70–99)
Glucose-Capillary: 115 mg/dL — ABNORMAL HIGH (ref 70–99)
Glucose-Capillary: 128 mg/dL — ABNORMAL HIGH (ref 70–99)
Glucose-Capillary: 30 mg/dL — CL (ref 70–99)
Glucose-Capillary: 40 mg/dL — CL (ref 70–99)
Glucose-Capillary: 50 mg/dL — ABNORMAL LOW (ref 70–99)
Glucose-Capillary: 90 mg/dL (ref 70–99)

## 2024-04-18 LAB — CBC
HCT: 38.9 % (ref 36.0–46.0)
Hemoglobin: 13.1 g/dL (ref 12.0–15.0)
MCH: 28 pg (ref 26.0–34.0)
MCHC: 33.7 g/dL (ref 30.0–36.0)
MCV: 83.1 fL (ref 80.0–100.0)
Platelets: 169 10*3/uL (ref 150–400)
RBC: 4.68 MIL/uL (ref 3.87–5.11)
RDW: 14.9 % (ref 11.5–15.5)
WBC: 4.9 10*3/uL (ref 4.0–10.5)
nRBC: 0 % (ref 0.0–0.2)

## 2024-04-18 LAB — LACTIC ACID, PLASMA: Lactic Acid, Venous: 2.7 mmol/L (ref 0.5–1.9)

## 2024-04-18 MED ORDER — OXYCODONE HCL 5 MG PO TABS
5.0000 mg | ORAL_TABLET | Freq: Two times a day (BID) | ORAL | Status: DC | PRN
  Administered 2024-04-18 – 2024-04-21 (×7): 5 mg via ORAL
  Filled 2024-04-18 (×7): qty 1

## 2024-04-18 MED ORDER — DEXTROSE 50 % IV SOLN: 1.0000 | INTRAVENOUS | Status: DC | PRN

## 2024-04-18 MED ORDER — DEXTROSE 50 % IV SOLN
INTRAVENOUS | Status: AC
  Administered 2024-04-18: 50 mL
  Filled 2024-04-18: qty 50

## 2024-04-18 MED ORDER — RENA-VITE PO TABS
1.0000 | ORAL_TABLET | Freq: Every day | ORAL | Status: DC
  Administered 2024-04-19 – 2024-04-20 (×2): 1 via ORAL
  Filled 2024-04-18 (×3): qty 1

## 2024-04-18 MED ORDER — NEPRO/CARBSTEADY PO LIQD
237.0000 mL | Freq: Three times a day (TID) | ORAL | Status: DC
  Administered 2024-04-18 – 2024-04-21 (×8): 237 mL via ORAL

## 2024-04-18 MED ORDER — DIPHENHYDRAMINE HCL 25 MG PO CAPS
25.0000 mg | ORAL_CAPSULE | Freq: Four times a day (QID) | ORAL | Status: DC | PRN
  Administered 2024-04-18 – 2024-04-21 (×3): 25 mg via ORAL
  Filled 2024-04-18 (×3): qty 1

## 2024-04-18 MED ORDER — CHLORHEXIDINE GLUCONATE CLOTH 2 % EX PADS: 6.0000 | MEDICATED_PAD | Freq: Every day | CUTANEOUS | Status: DC

## 2024-04-18 MED ORDER — DEXTROSE 10 % IV SOLN: INTRAVENOUS | Status: DC

## 2024-04-18 NOTE — Progress Notes (Signed)
   04/17/24 2130  Vitals  Temp 97.9 F (36.6 C)  Temp Source Oral  BP 112/69  BP Location Left Arm  BP Method Automatic  Patient Position (if appropriate) Lying  Pulse Rate (!) 110  Pulse Rate Source Monitor  Resp 20  Oxygen Therapy  O2 Device Nasal Cannula  O2 Flow Rate (L/min) 6 L/min  During Treatment Monitoring  Blood Flow Rate (mL/min) 0 mL/min  Arterial Pressure (mmHg) -1.82 mmHg  Venous Pressure (mmHg) -2.42 mmHg  TMP (mmHg) -51.91 mmHg  Ultrafiltration Rate (mL/min) 637 mL/min  Dialysate Flow Rate (mL/min) 0 ml/min  Dialysate Potassium Concentration 2  Dialysate Calcium  Concentration 2.5  Duration of HD Treatment -hour(s) 2.89 hour(s)  Cumulative Fluid Removed (mL) per Treatment  2327.42  HD Safety Checks Performed Yes  Intra-Hemodialysis Comments Tx completed  Post Treatment  Dialyzer Clearance Lightly streaked  Liters Processed 63.7  Fluid Removed (mL) 2300 mL  Tolerated HD Treatment Yes  AVG/AVF Arterial Site Held (minutes) 10 minutes  AVG/AVF Venous Site Held (minutes) 7 minutes

## 2024-04-18 NOTE — Plan of Care (Signed)
   Problem: Education: Goal: Knowledge of General Education information will improve Description: Including pain rating scale, medication(s)/side effects and non-pharmacologic comfort measures Outcome: Progressing   Problem: Clinical Measurements: Goal: Will remain free from infection Outcome: Progressing

## 2024-04-18 NOTE — Evaluation (Signed)
 Occupational Therapy Evaluation Patient Details Name: [name] MRN: [id number] DOB: 30-Mar-1981 Today's Date: 04/18/2024   History of Present Illness   [name] is a 43 y/o female admitted 04/17/24 with SOB and chest tightness/coughing. CXR showed cardiomegaly, small bilateral pleural effusions. Dx with CHF. She does have a chronic wound on her R thigh. PMH includes afib/aflutter, ESRD TThSa, polysubstance abuse, heart murmur, malnutrition, ESRD on HD, subdural hematoma in 11/2022. Of note: multiple readmissions 12/24, 1/26-01/30/24     Clinical Impressions Pt is typically independent in ADL and mobility. She is not working at this time. Reports no falls, but that she is fearful of falling (fell at Rehab with broken ribs previously) She was able to perform LB dressing at bed level with set up, perform sit<>stand transfers with GCA. Currently demonstrating decreased balance and activity tolerance requiring BUE support pushing IV pole during short ambulation with RR up to 31, HR up to 126 (from around 100 at rest) BP WFL 100/72. Pt will benefit from skilled OT in the acute setting and HHOT post-acute to maximize safety and independence in ADL and functional transfers. Also while acutely focus on energy conservation strategies including but not limited to AE.  Recommending shower chair at this time for energy conservation and falls prevention (she wraps and waterproofs R thigh prior to bathing).      If plan is discharge home, recommend the following:   Help with stairs or ramp for entrance;A little help with bathing/dressing/bathroom;Assistance with cooking/housework;Assist for transportation     Functional Status Assessment   Patient has had a recent decline in their functional status and demonstrates the ability to make significant improvements in function in a reasonable and predictable amount of time.     Equipment Recommendations   Tub/shower seat     Recommendations for  Other Services   PT consult;Other (comment) (mobility specialist)     Precautions/Restrictions   Precautions Precautions: Fall Recall of Precautions/Restrictions: Intact Precaution/Restrictions Comments: cold fingers, SpO2 reading tough Restrictions Weight Bearing Restrictions Per Provider Order: No     Mobility Bed Mobility Overal bed mobility: Needs Assistance Bed Mobility: Supine to Sit, Sit to Supine     Supine to sit: Supervision Sit to supine: Supervision   General bed mobility comments: supervision for line management    Transfers Overall transfer level: Needs assistance Equipment used:  (pushed IV pole for balance) Transfers: Sit to/from Stand Sit to Stand: Contact guard assist           General transfer comment: no physical assist needed, does benefit from pushing IV pole and cues for breathing technique      Balance Overall balance assessment: Needs assistance Sitting-balance support: Single extremity supported, Feet supported Sitting balance-Leahy Scale: Fair     Standing balance support: Single extremity supported, Bilateral upper extremity supported, Reliant on assistive device for balance Standing balance-Leahy Scale: Fair                             ADL either performed or assessed with clinical judgement   ADL Overall ADL's : Needs assistance/impaired Eating/Feeding: Modified independent   Grooming: Contact guard assist;Standing Grooming Details (indicate cue type and reason): decreased activity tolerance for standing activities Upper Body Bathing: Minimal assistance;Sitting Upper Body Bathing Details (indicate cue type and reason): back Lower Body Bathing: Set up;Sitting/lateral leans   Upper Body Dressing : Minimal assistance Upper Body Dressing Details (indicate cue type and reason): donning gown  due to line management Lower Body Dressing: Set up;Sitting/lateral leans Lower Body Dressing Details (indicate cue type and  reason): donning socks Toilet Transfer: Contact guard assist;Ambulation Toilet Transfer Details (indicate cue type and reason): pushes IV pole Toileting- Clothing Manipulation and Hygiene: Contact guard assist;Sit to/from stand       Functional mobility during ADLs: Contact guard assist (pushing IV pole BUE) General ADL Comments: fatigues quickly, becomes SOB (RR 31) SpO2 WFL (when reading) and HR WFL     Vision Ability to See in Adequate Light: 0 Adequate Patient Visual Report: No change from baseline Vision Assessment?: No apparent visual deficits     Perception         Praxis         Pertinent Vitals/Pain Pain Assessment Pain Assessment: 0-10 Pain Score: 3  Pain Location: R thigh Pain Descriptors / Indicators: Discomfort, Guarding, Sore Pain Intervention(s): Monitored during session, Repositioned     Extremity/Trunk Assessment Upper Extremity Assessment Upper Extremity Assessment: Generalized weakness   Lower Extremity Assessment Lower Extremity Assessment: RLE deficits/detail;Defer to PT evaluation;Generalized weakness RLE Deficits / Details: thigh wound - wound care following       Communication Communication Communication: No apparent difficulties   Cognition Arousal: Alert (initially sleepy, but aroused easily) Behavior During Therapy: WFL for tasks assessed/performed, Flat affect Cognition: No apparent impairments             OT - Cognition Comments: Pt quiet, but answers questions appropriately, and  follows cues accurately                 Following commands: Intact       Cueing  General Comments   Cueing Techniques: Verbal cues;Gestural cues  RR up to 31 with activity, HR remained 126 and lower throughout session with ambulation, SpO2 difficult to get a reading on - when reading appropriately at end of session was >90% on 3L   Exercises     Shoulder Instructions      Home Living Family/patient expects to be discharged to::  Private residence Living Arrangements: Parent Available Help at Discharge: Family;Available PRN/intermittently Type of Home: Apartment Home Access: Stairs to enter Entrance Stairs-Number of Steps: 16 Entrance Stairs-Rails: Right;Left;Can reach both Home Layout: One level     Bathroom Shower/Tub: Chief Strategy Officer: Standard Bathroom Accessibility: Yes How Accessible: Accessible via walker Home Equipment: Wheelchair - manual          Prior Functioning/Environment Prior Level of Function : Independent/Modified Independent             Mobility Comments: does not use DME ADLs Comments: fatigues at quickly, but can do everything for herself    OT Problem List: Decreased strength;Decreased activity tolerance;Impaired balance (sitting and/or standing);Decreased knowledge of use of DME or AE;Cardiopulmonary status limiting activity;Pain   OT Treatment/Interventions: Self-care/ADL training;Therapeutic exercise;Energy conservation;DME and/or AE instruction;Therapeutic activities;Patient/family education;Balance training      OT Goals(Current goals can be found in the care plan section)   Acute Rehab OT Goals Patient Stated Goal: get some sleep, breathe easier OT Goal Formulation: With patient Time For Goal Achievement: 05/02/24 Potential to Achieve Goals: Good ADL Goals Pt Will Perform Grooming: with modified independence;standing Pt Will Perform Upper Body Dressing: with modified independence;sitting Pt Will Perform Lower Body Dressing: with modified independence;sit to/from stand Pt Will Transfer to Toilet: with modified independence;ambulating Pt Will Perform Toileting - Clothing Manipulation and hygiene: with modified independence;sitting/lateral leans Additional ADL Goal #1: Pt will verbalize at least  3 ways to conserve energy during ADL with no cues   OT Frequency:  Min 2X/week    Co-evaluation              AM-PAC OT "6 Clicks" Daily Activity      Outcome Measure Help from another person eating meals?: None Help from another person taking care of personal grooming?: A Little Help from another person toileting, which includes using toliet, bedpan, or urinal?: A Little Help from another person bathing (including washing, rinsing, drying)?: A Little Help from another person to put on and taking off regular upper body clothing?: A Little Help from another person to put on and taking off regular lower body clothing?: A Little 6 Click Score: 19   End of Session Equipment Utilized During Treatment: Gait belt;Oxygen (3L) Nurse Communication: Mobility status;Precautions  Activity Tolerance: Patient tolerated treatment well Patient left: in bed;with call bell/phone within reach;with bed alarm set  OT Visit Diagnosis: Unsteadiness on feet (R26.81);Muscle weakness (generalized) (M62.81);Pain Pain - Right/Left: Right Pain - part of body: Leg                Time: 3244-0102 OT Time Calculation (min): 27 min Charges:  OT General Charges $OT Visit: 1 Visit OT Evaluation $OT Eval Low Complexity: 1 Low OT Treatments $Self Care/Home Management : 8-22 mins  [place] OTR/L Acute Rehabilitation Services Office: [phone]   Ebony Goldstein [facility] 04/18/2024, 11:21 AM

## 2024-04-18 NOTE — Plan of Care (Signed)
  Problem: Activity: Goal: Capacity to carry out activities will improve Outcome: Progressing   Problem: Clinical Measurements: Goal: Diagnostic test results will improve Outcome: Progressing Goal: Respiratory complications will improve Outcome: Progressing Goal: Cardiovascular complication will be avoided Outcome: Progressing   Problem: Activity: Goal: Risk for activity intolerance will decrease Outcome: Progressing   Problem: Coping: Goal: Level of anxiety will decrease Outcome: Progressing   Problem: Pain Managment: Goal: General experience of comfort will improve and/or be controlled Outcome: Progressing   Problem: Safety: Goal: Ability to remain free from injury will improve Outcome: Progressing

## 2024-04-18 NOTE — Progress Notes (Signed)
 PT Cancellation Note  Patient Details Name: [name] MRN: [id number] DOB: [birth year]   Cancelled Treatment:    Reason Eval/Treat Not Completed: Fatigue/lethargy limiting ability to participate;Medical issues which prohibited therapy   Patient reports recent pain medication (due to need to change Rt thigh dressing). States she is currently too nauseated and groggy to work with PT. Will continue efforts.    Gayle Kava, PT Acute Rehabilitation Services  Office [phone]   [place] Leep 04/18/2024, 2:53 PM

## 2024-04-18 NOTE — Progress Notes (Signed)
 [name], [name] (on midodrine ), [name], [name], [name], [name], [name], [name], RUE AVF, no heparin  - no ESA, Hgb > 12 - Hectoral 2mcg IV q HD  # Acute on Chronic Resp Failure/pulm edema: Status post dialysis with 2.3 L UF, completed HD this morning.  Plan for next dialysis tomorrow.   #[name]:  Usual MWF schedule - HD tomorrow. No heparin . #Hypotension/volume: On midodrine , UF as tolerated #Anemia: Hgb > 12, no ESA needed #Metabolic bone disease: Ca high, hold VDRA for now. Continue home binders. #Nutrition:  Alb low, rec high protein supplements #[name]  Subjective: Seen and examined.  Her breathing is much better after dialysis.  Still requiring supplemental oxygen.  Completed HD this morning.  Denies nausea, vomiting or chest pain. Objective Vital signs in last 24 hours: Vitals:   04/18/24 0750 04/18/24 0752 04/18/24 1000 04/18/24 1102  BP: 100/72 (!) 100/59 (!) 94/55 (!) 97/51  Pulse: (!) 106 (!) 107 (!) 103 (!) 105  Resp: (!) 23 19 19 19   Temp: 98.6 F (37 C)   98.7 F (37.1 C)  TempSrc: Oral   Oral  SpO2: 93% 92% 95% 97%  Weight:      Height:       Weight change: 1.1 kg  Intake/Output Summary (Last 24 hours) at 04/18/2024 1147 Last data filed at 04/18/2024 0849 Gross per 24 hour  Intake 120 ml  Output 4600 ml  Net -4480 ml       Labs: RENAL PANEL Recent Labs  Lab 04/17/24 0555 04/17/24 0758  NA 137  --   K 5.2*  --   CL 87*  --   CO2 18*  --   GLUCOSE 63*  --   BUN 50*  --   CREATININE 6.49*  --   CALCIUM  10.1  --   ALBUMIN   --  2.6*    Liver Function Tests: Recent Labs  Lab 04/17/24 0758  AST 39  ALT 23  ALKPHOS 352*  BILITOT 1.3*  PROT 8.1  ALBUMIN  2.6*   No results for input(s): "LIPASE", "AMYLASE" in the last 168 hours. No results for input(s): "AMMONIA" in the  last 168 hours. CBC: Recent Labs    01/25/24 0748 01/27/24 1101 01/29/24 1314 04/17/24 0555 04/18/24 0319  HGB 9.7* 10.1* 9.2* 12.7 13.1  MCV 98.4 102.1* 95.5 84.2 83.1    Cardiac Enzymes: No results for input(s): "CKTOTAL", "CKMB", "CKMBINDEX", "TROPONINI" in the last 168 hours. CBG: Recent Labs  Lab 04/17/24 1143 04/18/24 0027 04/18/24 0347 04/18/24 0747 04/18/24 1129  GLUCAP 84 128* 90 115* 100*    Iron  Studies: No results for input(s): "IRON ", "TIBC", "TRANSFERRIN", "FERRITIN" in the last 72 hours. Studies/Results: CT ABDOMEN PELVIS W CONTRAST Result Date: 04/17/2024 CLINICAL DATA:  Abdominal pain, acute, nonlocalized EXAM: CT ABDOMEN AND PELVIS WITH CONTRAST TECHNIQUE: Multidetector CT imaging of the abdomen and pelvis was performed using the standard protocol following bolus administration of intravenous contrast. RADIATION DOSE REDUCTION: This exam was performed according to the departmental dose-optimization program which includes automated exposure control, adjustment of the mA and/or kV according to patient size and/or use of iterative reconstruction technique. CONTRAST:  75mL OMNIPAQUE  IOHEXOL  350 MG/ML SOLN COMPARISON:  December 15, 2023 FINDINGS: Lower chest: For findings above the diaphragm, please see the separately dictated CT of the chest report,  which was performed concurrently. Hepatobiliary: No mass. Reflux of contrast into the hepatic veins. Heterogeneous enhancement of the left hepatic lobe and inferior right hepatic tip.No radiopaque stones or wall thickening of the gallbladder.No intrahepatic or extrahepatic biliary ductal dilation. Pancreas: No mass or main ductal dilation.No peripancreatic inflammation or fluid collection. Spleen: No mass. Heterogeneous enhancement throughout the subcapsular spleen, likely due to the phase of contrast. Adrenals/Urinary Tract: No adrenal masses. Severe renal atrophy bilaterally. No mass. No nephrolithiasis or hydronephrosis.  Decompressed urinary bladder without visualized focal abnormality. Stomach/Bowel: The stomach is decompressed without focal abnormality. No small bowel wall thickening or inflammation. No small bowel obstruction. Normal appendix. Vascular/Lymphatic: No aortic aneurysm. No intraabdominal or pelvic lymphadenopathy. Reproductive: The uterus and ovaries are within normal limits for patient's age.No free pelvic fluid. Other: No pneumoperitoneum. Small volume ascites with diffuse mesenteric edema. Musculoskeletal: No acute fracture or destructive lesion.Renal osteodystrophy changes. 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 IMPRESSION: 1. No acute intra-abdominal or pelvic abnormality. 2. Small volume ascites with diffuse mesenteric edema, likely related to the patient's volume status. 3. Reflux of contrast into the hepatic veins, consistent with underlying cardiac dysfunction. Heterogeneous enhancement of the left hepatic lobe and inferior right hepatic tip is also likely related to the cardiac dysfunction (congestive hepatopathy). Correlation with liver enzymes recommended to exclude acute hepatitis. Electronically Signed   By: Rance Burrows M.D.   On: 04/17/2024 12:04   CT Angio Chest PE W and/or Wo Contrast Result Date: 04/17/2024 CLINICAL DATA:  Pulmonary embolism (PE) suspected, high prob EXAM: CT ANGIOGRAPHY CHEST WITH CONTRAST TECHNIQUE: Multidetector CT imaging of the chest was performed using the standard protocol during bolus administration of intravenous contrast. Multiplanar CT image reconstructions and MIPs were obtained to evaluate the vascular anatomy. RADIATION DOSE REDUCTION: This exam was performed according to the departmental dose-optimization program which includes automated exposure control, adjustment of the mA and/or kV according to patient size and/or use of iterative reconstruction technique. CONTRAST:  75mL OMNIPAQUE  IOHEXOL  350 MG/ML SOLN COMPARISON:  January 08, 2024  FINDINGS: Pulmonary Embolism: The segmental and subsegmental branches of the left lower lobe are not well opacified by the contrast bolus. Otherwise, no visualized pulmonary embolus in the remaining pulmonary arteries. Cardiovascular: Severe cardiomegaly. No aortic aneurysm. Diffuse aortic atherosclerosis. Extensive multi-vessel coronary atherosclerosis. Mediastinum/Nodes: No mediastinal mass. Enlarged multi station mediastinal and bilateral hilar lymph nodes, unchanged, likely reactive. Lungs/Pleura: The trachea is midline and patent. Mild diffuse bronchial wall thickening with interlobular septal thickening throughout the lungs. No lobar airspace consolidation or pneumothorax. Small bilateral pleural effusions with hazy ground-glass attenuation throughout the lungs. 4 mm right lower lobe nodule (axial 88), unchanged. Musculoskeletal: No acute fracture or destructive bone lesion. Renal osteodystrophy changes. Multilevel thoracic osteophytosis. Upper Abdomen: For findings below the diaphragm, please see the separately dictated CT of the abdomen and pelvis report, which was performed concurrently. Review of the MIP images confirms the above findings. IMPRESSION: 1. The segmental and subsegmental branches of the left lower lobe are not well opacified by the contrast bolus. Otherwise, no visualized pulmonary embolus in the remaining pulmonary arteries. 2. Severe cardiomegaly, unchanged, with superimposed findings of pulmonary edema. Small bilateral pleural effusions. 3. Right lower lobe pulmonary nodule measuring 4 mm, unchanged. Follow-up could be considered, as documented below. 4. For findings below the diaphragm, please see the separately dictated CT of the abdomen and pelvis report, which was performed concurrently. No follow-up needed if patient is low-risk.This recommendation follows the consensus statement: Guidelines for Management  of Incidental Pulmonary Nodules Detected on CT Images: From the Fleischner  Society 2017; Radiology 2017; ([name]. Aortic Atherosclerosis (ICD10-I70.0). Electronically Signed   By: Rance Burrows M.D.   On: 04/17/2024 11:49   DG Chest 2 View Result Date: 04/17/2024 CLINICAL DATA:  Shortness of breath EXAM: CHEST - 2 VIEW COMPARISON:  01/23/2024 FINDINGS: Unchanged cardiac enlargement. Small bilateral pleural effusions, left greater than right. Diffuse increase interstitial markings noted bilaterally. Bibasilar atelectasis. Visualized osseous structures appear intact. IMPRESSION: Cardiomegaly, small bilateral pleural effusions and diffuse increase interstitial markings compatible with CHF. Electronically Signed   By: Kimberley Penman M.D.   On: 04/17/2024 06:20    Medications: Infusions:  amiodarone  30 mg/hr (04/18/24 1059)    Scheduled Medications:  cinacalcet   30 mg Oral QPM   feeding supplement (NEPRO CARB STEADY)  237 mL Oral TID BM   melatonin  3 mg Oral QHS   midodrine   10 mg Oral TID WC   multivitamin  1 tablet Oral QHS   sevelamer  carbonate  2,400 mg Oral TID with meals   sodium chloride  flush  3 mL Intravenous Q12H    have reviewed scheduled and prn medications.  Physical Exam: General:NAD, comfortable Heart:RRR, s1s2 nl Lungs:clear b/l, no crackle Abdomen:soft, Non-tender, non-distended Extremities:No edema Dialysis Access: AV fistula.  [employer] Prasad Idelle Reimann 04/18/2024,11:47 AM  LOS: 1 day

## 2024-04-18 NOTE — Progress Notes (Signed)
  Progress Note   Patient: [name] MWU:[id number] DOB: 08-14-1981 DOA: 04/17/2024     1 DOS: the patient was seen and examined on 04/18/2024   Brief hospital course: 43 y.o. female with past medical history  of atrial flutter history of A-fib, HFrEF with a EF of 30 to 35% on most recent echo, end-stage renal disease on hemodialysis Tuesday Thursday and Saturday schedule, valvular heart disease with mitral stenosis and regurg aortic insufficiency, allergy to cephalosporin, tobramycin, vancomycin  coming for shortness of breath.  On initial presentation patient was tachycardic and dyspneic and started on 3 L nasal cannula.   Assessment and Plan:  Acute hypoxic respiratory failure - Likely secondary to volume overload, HFrEF, ESRD.  Responded well to HD yesterday removing 2.3 L.  Weaned from 3 L nasal cannula to room air.  Hypoxia appears resolved.  Acute exacerbation of chronic HFpEF - Exacerbated by worsening a flutter and medication noncompliance.  Showing improvement after ultrafiltration.  Cardiology following closely.  Atrial flutter with RVR - Likely underlying etiology of above.  Currently on amiodarone  drip.  Followed closely by cardiology.  Heart rate appears improved this morning.  Currently not on anticoagulation secondary to recurrent falls and subdural hematoma.  Elevated troponin - Likely demand ischemia in setting of ESRD.  Patient does not have any chest pain.  No additional ischemic workup needed.  ESRD on HD - Managed closely by nephrology.  Received HD yesterday 4/21.  Scheduled for HD again today.  Usual schedule MWF.  Mild-severe tricuspid and aortic regurgitation - Does not a candidate for valvular intervention.  Monitor closely by cardiology    Subjective: Patient resting comfortably this morning.  States she feels improved from yesterday.  Breathing easier.  Denies any chest pain, worsening shortness of breath, fever, chills, nausea, vomiting, abdominal  pain.  Physical Exam: Vitals:   04/18/24 0750 04/18/24 0752 04/18/24 1000 04/18/24 1102  BP: 100/72 (!) 100/59 (!) 94/55 (!) 97/51  Pulse: (!) 106 (!) 107 (!) 103 (!) 105  Resp: (!) 23 19 19 19   Temp: 98.6 F (37 C)   98.7 F (37.1 C)  TempSrc: Oral   Oral  SpO2: 93% 92% 95% 97%  Weight:      Height:       GENERAL:  Alert, pleasant, no acute distress, frail HEENT:  EOMI CARDIOVASCULAR:  RRR, no murmurs appreciated RESPIRATORY: Poor air movement bilaterally GASTROINTESTINAL:  Soft, nontender, nondistended EXTREMITIES: Thin, no LE edema bilaterally NEURO:  No new focal deficits appreciated SKIN:  No rashes noted PSYCH:  Appropriate mood and affect   Data Reviewed:  There are no new results to review at this time.  Family Communication: None at bedside  Disposition: Status is: Inpatient Remains inpatient appropriate because: Respiratory failure on ESRD  Planned Discharge Destination: Home    Time spent: 36 minutes  Author: Jodeane Mulligan, DO 04/18/2024 1:15 PM  For on call review [email or web address].

## 2024-04-18 NOTE — Progress Notes (Signed)
 Initial Nutrition Assessment  DOCUMENTATION CODES:   Severe malnutrition in context of chronic illness  INTERVENTION:  Liberalize diet to regular to promote PO intake Increase Nepro Shake po TID, each supplement provides 425 kcal and 19 grams protein  Renal MVI w/ minerals QHS snack to encourage calorie/protein intake and supplement missed dialysis meal, if indicated Magic cup TID with meals, each supplement provides 290 kcal and 9 grams of protein   NUTRITION DIAGNOSIS:  Severe Malnutrition related to chronic illness (ESRD-HD, Lupus, arthritis) as evidenced by severe muscle depletion, severe fat depletion, percent weight loss.  GOAL:  Patient will meet greater than or equal to 90% of their needs  MONITOR:  PO intake, Supplement acceptance, Labs, Weight trends  REASON FOR ASSESSMENT:  Consult Assessment of nutrition requirement/status  ASSESSMENT:   Pt with PMH: afib/aflutter, ESRD-HD, HFrEF, polysubstance abuse, heart murmur, arthritis, anxiety, lupus and malnutrition. Presented to ED with c/o chest pain and difficulty breathing. CXR consistent with pulmonary edema and CHF exacerbation. Also noted to be in afib. Started on amiodarone .   4/21 admitted 4/21 CT: small volume ascites, diffuse mesenteric edema, small bilateral pleural effusions  Had dialysis this morning with 2.3 L UF achieved. She states her respiratory status is improving. Cardiology bolusing amiodarone  r/t afib.   Average Meal Intake No documented intake to review  Met with patient at bedside today. Reports fatigue s/p dialysis this morning. Willing to engage in brief conversation at bedside. Endorses stable, yet diminished appetite PTA. She attributes this to her concurrent and prolonged hospitalizations over the last two months. Breakfast tray at bedside. Approximately one half of her meal consumed consisting of eggs, sausage, and pancakes.   24 Hour Recall B: cereal OR eggs, sausage and pancakes L: sub  sandwich w/ chips D: varies 50/50: take out OR protein, starch, veg at home   Snacks: Nepro (patient purchases herself)  She reports no issues with chewing or swallowing. Bowels vary in both frequency and texture. She does receive Nepro at her outpatient clinic and reports that she purchases them on her own as well. She is amenable to three offerings per day while admitted.   Discussed importance of continued protein/calorie intake to promote wound healing and replenish losses sustained during dialysis. She is observed with ascites to her abdomen.  Admit Weight: 39kg Current Weight: 40.1kg EDW: 39.5kg  UBW and previous dry weight observed and reported as around 45kg. This is a 12% weight loss in last three months and is considered clinically significant for the time frame. Per chart review, No edema on exam. States she holds fluid around her lungs at baseline. Continues with pressure injury to R upper thigh. Refusing wound care at times.    Intake/Output Summary (Last 24 hours) at 04/18/2024 1014 Last data filed at 04/18/2024 0849 Gross per 24 hour  Intake 120 ml  Output 4600 ml  Net -4480 ml    Net IO Since Admission: -4,480 mL [04/18/24 1014]   Drains/Lines: RUE AVF +bruit/thrill  Corrected calcium  elevated. Holding VDRA. Cincalcet initiated and non-calcium  based binder ordered.   Meds: cinacalcet , melatonin, sevelamer  carbonate  Labs: Na+ 137 (wdl) K+ 5.2 (H) Lactic Acid 2.7 (H) Corr Ca 11.2 (H) CBGs 63mg /dL E95 hours M8U 5.4 (13/2440)    NUTRITION - FOCUSED PHYSICAL EXAM:  Flowsheet Row Most Recent Value  Orbital Region Severe depletion  Upper Arm Region Severe depletion  Thoracic and Lumbar Region Severe depletion  Buccal Region Severe depletion  Temple Region Severe depletion  Clavicle Bone  Region Severe depletion  Clavicle and Acromion Bone Region Severe depletion  Scapular Bone Region Severe depletion  Dorsal Hand Severe depletion  Patellar Region Severe  depletion  Anterior Thigh Region Severe depletion  Posterior Calf Region Severe depletion  Edema (RD Assessment) None  Hair Reviewed  Eyes Reviewed  Mouth Reviewed  Skin Reviewed  Nails Reviewed   Diet Order:   Diet Order             Diet renal with fluid restriction Fluid restriction: 1200 mL Fluid; Room service appropriate? Yes; Fluid consistency: Thin  Diet effective now             EDUCATION NEEDS:   Education needs have been addressed  Skin:  Skin Assessment: Skin Integrity Issues: Skin Integrity Issues:: DTI DTI: R thigh; full thickness  Last BM:  4/20 - PTA  Height:  Ht Readings from Last 1 Encounters:  04/17/24 5\' 3"  (1.6 m)   Weight:  Wt Readings from Last 1 Encounters:  04/18/24 40.1 kg   Ideal Body Weight:  52.3 kg  BMI:  Body mass index is 15.66 kg/m.  Estimated Nutritional Needs:   Kcal:  1300-1500kcal  Protein:  65-80g  Fluid:  1L + UOP  Con Decant MS, RD, LDN Registered Dietitian Clinical Nutrition RD Inpatient Contact Info in Amion

## 2024-04-18 NOTE — Progress Notes (Addendum)
 Pt is refusing for this RN to view Right thigh wound. Pt c/o that the wound is painful. Tylenol  given per order. Wound currently has a dressing in place that appears somewhat old. Wound odor is present. No drainage noted on the dressing. DO notified.  Pt is pulling at tele leads, BP cuff, IV, and has taken off gown. Pt expresses being very uncomfortable due to the "lines and cords everywhere." Pt educated on the importance of the monitoring and iv.

## 2024-04-18 NOTE — Progress Notes (Signed)
 [facility] night cross cover note:   I ordered prn Benadryl  for itching.   Camelia Cavalier, DO Hospitalist

## 2024-04-18 NOTE — Progress Notes (Signed)
 Hypoglycemic Event  CBG: 50  Treatment: 4 oz juice/soda  Symptoms: None  Follow-up CBG: Time: 2028 (patient attempting to drink juice and eat crackers) CBG Result: 30  Possible Reasons for Event: Inadequate meal intake  Comments/MD notified:protocol followed  Patient given 25g of dextrose  IV-recheck in 15 minutes CBG 102.     Loman Risk

## 2024-04-18 NOTE — Progress Notes (Signed)
 Possible Amio infiltration, Assessed site and removed IV, contacted pharmacy and was told to elevate with cold or heat, monitor and if worsens to contact pharmacy for the next step.

## 2024-04-18 NOTE — Progress Notes (Signed)
 Progress Note  Patient Name: [name] Date of Encounter: 04/18/2024  Primary Cardiologist: Sheryle Donning, MD   Subjective   Patient seen and examined at her bedside.  She is lying.  Inpatient Medications    Scheduled Meds:  cinacalcet   30 mg Oral QPM   feeding supplement (NEPRO CARB STEADY)  237 mL Oral BID BM   melatonin  3 mg Oral QHS   midodrine   10 mg Oral TID WC   sevelamer  carbonate  2,400 mg Oral TID with meals   sodium chloride  flush  3 mL Intravenous Q12H   Continuous Infusions:  amiodarone  30 mg/hr (04/17/24 2237)   PRN Meds: acetaminophen  **OR** acetaminophen , hydrALAZINE , ondansetron  **OR** ondansetron  (ZOFRAN ) IV   Vital Signs    Vitals:   04/18/24 0400 04/18/24 0500 04/18/24 0750 04/18/24 0752  BP: (!) 103/51  100/72 (!) 100/59  Pulse: (!) 107  (!) 106 (!) 107  Resp: 16  (!) 23 19  Temp:   98.6 F (37 C)   TempSrc:   Oral   SpO2: 97%  93% 92%  Weight:  40.1 kg    Height:        Intake/Output Summary (Last 24 hours) at 04/18/2024 0901 Last data filed at 04/18/2024 0849 Gross per 24 hour  Intake 120 ml  Output 4600 ml  Net -4480 ml   Filed Weights   04/17/24 0526 04/18/24 0500  Weight: 39 kg 40.1 kg    Telemetry    Atrial flutter with rapid ventricular- Personally Reviewed  ECG     - Personally Reviewed  Physical Exam    General: Comfortable Head: Atraumatic, normal size  Eyes: PEERLA, EOMI  Neck: Supple, normal JVD Cardiac: Normal S1, S2; RRR; no murmurs, rubs, or gallops Lungs: Clear to auscultation bilaterally Abd: Soft, nontender, no hepatomegaly  Ext: warm, no edema Skin: Warm and dry, no rashes    Labs    Chemistry Recent Labs  Lab 04/17/24 0555 04/17/24 0758  NA 137  --   K 5.2*  --   CL 87*  --   CO2 18*  --   GLUCOSE 63*  --   BUN 50*  --   CREATININE 6.49*  --   CALCIUM  10.1  --   PROT  --  8.1  ALBUMIN   --  2.6*  AST  --  39  ALT  --  23  ALKPHOS  --  352*  BILITOT  --  1.3*  GFRNONAA  8*  --   ANIONGAP 32*  --      Hematology Recent Labs  Lab 04/17/24 0555 04/18/24 0319  WBC 5.2 4.9  RBC 4.57 4.68  HGB 12.7 13.1  HCT 38.5 38.9  MCV 84.2 83.1  MCH 27.8 28.0  MCHC 33.0 33.7  RDW 15.0 14.9  PLT 175 169    Cardiac EnzymesNo results for input(s): "TROPONINI" in the last 168 hours. No results for input(s): "TROPIPOC" in the last 168 hours.   BNP Recent Labs  Lab 04/17/24 0555  BNP >4,500.0*     DDimer  Recent Labs  Lab 04/17/24 0612  DDIMER 1.95*     Radiology    CT ABDOMEN PELVIS W CONTRAST Result Date: 04/17/2024 CLINICAL DATA:  Abdominal pain, acute, nonlocalized EXAM: CT ABDOMEN AND PELVIS WITH CONTRAST TECHNIQUE: Multidetector CT imaging of the abdomen and pelvis was performed using the standard protocol following bolus administration of intravenous contrast. RADIATION DOSE REDUCTION: This exam was performed according to the departmental dose-optimization  program which includes automated exposure control, adjustment of the mA and/or kV according to patient size and/or use of iterative reconstruction technique. CONTRAST:  75mL OMNIPAQUE  IOHEXOL  350 MG/ML SOLN COMPARISON:  December 15, 2023 FINDINGS: Lower chest: For findings above the diaphragm, please see the separately dictated CT of the chest report, which was performed concurrently. Hepatobiliary: No mass. Reflux of contrast into the hepatic veins. Heterogeneous enhancement of the left hepatic lobe and inferior right hepatic tip.No radiopaque stones or wall thickening of the gallbladder.No intrahepatic or extrahepatic biliary ductal dilation. Pancreas: No mass or main ductal dilation.No peripancreatic inflammation or fluid collection. Spleen: No mass. Heterogeneous enhancement throughout the subcapsular spleen, likely due to the phase of contrast. Adrenals/Urinary Tract: No adrenal masses. Severe renal atrophy bilaterally. No mass. No nephrolithiasis or hydronephrosis. Decompressed urinary bladder without  visualized focal abnormality. Stomach/Bowel: The stomach is decompressed without focal abnormality. No small bowel wall thickening or inflammation. No small bowel obstruction. Normal appendix. Vascular/Lymphatic: No aortic aneurysm. No intraabdominal or pelvic lymphadenopathy. Reproductive: The uterus and ovaries are within normal limits for patient's age.No free pelvic fluid. Other: No pneumoperitoneum. Small volume ascites with diffuse mesenteric edema. Musculoskeletal: No acute fracture or destructive lesion.Renal osteodystrophy changes. 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 IMPRESSION: 1. No acute intra-abdominal or pelvic abnormality. 2. Small volume ascites with diffuse mesenteric edema, likely related to the patient's volume status. 3. Reflux of contrast into the hepatic veins, consistent with underlying cardiac dysfunction. Heterogeneous enhancement of the left hepatic lobe and inferior right hepatic tip is also likely related to the cardiac dysfunction (congestive hepatopathy). Correlation with liver enzymes recommended to exclude acute hepatitis. Electronically Signed   By: Rance Burrows M.D.   On: 04/17/2024 12:04   CT Angio Chest PE W and/or Wo Contrast Result Date: 04/17/2024 CLINICAL DATA:  Pulmonary embolism (PE) suspected, high prob EXAM: CT ANGIOGRAPHY CHEST WITH CONTRAST TECHNIQUE: Multidetector CT imaging of the chest was performed using the standard protocol during bolus administration of intravenous contrast. Multiplanar CT image reconstructions and MIPs were obtained to evaluate the vascular anatomy. RADIATION DOSE REDUCTION: This exam was performed according to the departmental dose-optimization program which includes automated exposure control, adjustment of the mA and/or kV according to patient size and/or use of iterative reconstruction technique. CONTRAST:  75mL OMNIPAQUE  IOHEXOL  350 MG/ML SOLN COMPARISON:  January 08, 2024 FINDINGS: Pulmonary Embolism: The segmental  and subsegmental branches of the left lower lobe are not well opacified by the contrast bolus. Otherwise, no visualized pulmonary embolus in the remaining pulmonary arteries. Cardiovascular: Severe cardiomegaly. No aortic aneurysm. Diffuse aortic atherosclerosis. Extensive multi-vessel coronary atherosclerosis. Mediastinum/Nodes: No mediastinal mass. Enlarged multi station mediastinal and bilateral hilar lymph nodes, unchanged, likely reactive. Lungs/Pleura: The trachea is midline and patent. Mild diffuse bronchial wall thickening with interlobular septal thickening throughout the lungs. No lobar airspace consolidation or pneumothorax. Small bilateral pleural effusions with hazy ground-glass attenuation throughout the lungs. 4 mm right lower lobe nodule (axial 88), unchanged. Musculoskeletal: No acute fracture or destructive bone lesion. Renal osteodystrophy changes. Multilevel thoracic osteophytosis. Upper Abdomen: For findings below the diaphragm, please see the separately dictated CT of the abdomen and pelvis report, which was performed concurrently. Review of the MIP images confirms the above findings. IMPRESSION: 1. The segmental and subsegmental branches of the left lower lobe are not well opacified by the contrast bolus. Otherwise, no visualized pulmonary embolus in the remaining pulmonary arteries. 2. Severe cardiomegaly, unchanged, with superimposed findings of pulmonary edema. Small bilateral pleural effusions. 3.  Right lower lobe pulmonary nodule measuring 4 mm, unchanged. Follow-up could be considered, as documented below. 4. For findings below the diaphragm, please see the separately dictated CT of the abdomen and pelvis report, which was performed concurrently. No follow-up needed if patient is low-risk.This recommendation follows the consensus statement: Guidelines for Management of Incidental Pulmonary Nodules Detected on CT Images: From the Fleischner Society 2017; Radiology 2017; 284:228-243.  Aortic Atherosclerosis (ICD10-I70.0). Electronically Signed   By: Rance Burrows M.D.   On: 04/17/2024 11:49   DG Chest 2 View Result Date: 04/17/2024 CLINICAL DATA:  Shortness of breath EXAM: CHEST - 2 VIEW COMPARISON:  01/23/2024 FINDINGS: Unchanged cardiac enlargement. Small bilateral pleural effusions, left greater than right. Diffuse increase interstitial markings noted bilaterally. Bibasilar atelectasis. Visualized osseous structures appear intact. IMPRESSION: Cardiomegaly, small bilateral pleural effusions and diffuse increase interstitial markings compatible with CHF. Electronically Signed   By: Kimberley Penman M.D.   On: 04/17/2024 06:20    Cardiac Studies   Echo   Patient Profile     43 y.o. female with a history of ESRD on HD, polysubstance abuse, atrial fib/flutter, SLE, hypotension on midodrine , HFrEF, mitral stenosis, mitral regurgitation, aortic regurgitation.  Assessment & Plan   Atrial flutter with RVR  Acute on Chronic Systolic Heart Failure Elevated troponin  Moderate mitral valve regurgitation  Moderate -severe tricuspid valve regurgitation Moderate-severe aortic valve regurgitation   She is still with rapid ventricular rate despite heart rate has improved significantly compared to yesterday.  We will keep her on amiodarone  drip for now.  If needed will give another bolus.  She is status post dialysis yesterday respiratory rate has improved significantly.  Nephrology is following defer to them.  She is getting dialysis as well today.  Elevated troponin this is in the setting of her demand ischemia.  She does not complain of any anginal symptoms no plan for any further diagnostic workup at this time.  She has had a great deal of discussion in the past for anticoagulation and shared decision had been made in the past to take her off anticoagulation due to subdural hematoma and recurrent/recent falls.   There also been a great deal of discussion for her valvular heart  disease and the patient has been deemed not a candidate for valvular intervention.  She is on midodrine  chronically we will continue this.      For questions or updates, please contact [facility] Please consult [email or web address] for contact info under Cardiology/STEMI.      Signed, Raissa Dam, DO  04/18/2024, 9:01 AM

## 2024-04-19 DIAGNOSIS — I4892 Unspecified atrial flutter: Secondary | ICD-10-CM | POA: Diagnosis not present

## 2024-04-19 DIAGNOSIS — R0602 Shortness of breath: Secondary | ICD-10-CM | POA: Diagnosis not present

## 2024-04-19 LAB — HEMOGLOBIN A1C
Hgb A1c MFr Bld: 6.1 % — ABNORMAL HIGH (ref 4.8–5.6)
Mean Plasma Glucose: 128.37 mg/dL

## 2024-04-19 LAB — LIPID PANEL
Cholesterol: 58 mg/dL (ref 0–200)
HDL: 14 mg/dL — ABNORMAL LOW (ref >40)
LDL Cholesterol: 22 mg/dL (ref 0–99)
Total CHOL/HDL Ratio: 4.1 ratio
Triglycerides: 108 mg/dL (ref <150)
VLDL: 22 mg/dL (ref 0–40)

## 2024-04-19 LAB — GLUCOSE, CAPILLARY
Glucose-Capillary: 116 mg/dL — ABNORMAL HIGH (ref 70–99)
Glucose-Capillary: 14 mg/dL — CL (ref 70–99)
Glucose-Capillary: 28 mg/dL — CL (ref 70–99)
Glucose-Capillary: 66 mg/dL — ABNORMAL LOW (ref 70–99)
Glucose-Capillary: 69 mg/dL — ABNORMAL LOW (ref 70–99)
Glucose-Capillary: 73 mg/dL (ref 70–99)
Glucose-Capillary: 76 mg/dL (ref 70–99)
Glucose-Capillary: 78 mg/dL (ref 70–99)
Glucose-Capillary: 83 mg/dL (ref 70–99)
Glucose-Capillary: 86 mg/dL (ref 70–99)
Glucose-Capillary: 88 mg/dL (ref 70–99)
Glucose-Capillary: 88 mg/dL (ref 70–99)

## 2024-04-19 LAB — CBC
HCT: 37.4 % (ref 36.0–46.0)
Hemoglobin: 12.2 g/dL (ref 12.0–15.0)
MCH: 27.5 pg (ref 26.0–34.0)
MCHC: 32.6 g/dL (ref 30.0–36.0)
MCV: 84.2 fL (ref 80.0–100.0)
Platelets: 155 10*3/uL (ref 150–400)
RBC: 4.44 MIL/uL (ref 3.87–5.11)
RDW: 15.1 % (ref 11.5–15.5)
WBC: 4.9 10*3/uL (ref 4.0–10.5)
nRBC: 0 % (ref 0.0–0.2)

## 2024-04-19 LAB — RENAL FUNCTION PANEL
Albumin: 2.1 g/dL — ABNORMAL LOW (ref 3.5–5.0)
Anion gap: 20 — ABNORMAL HIGH (ref 5–15)
BUN: 52 mg/dL — ABNORMAL HIGH (ref 6–20)
CO2: 24 mmol/L (ref 22–32)
Calcium: 9.4 mg/dL (ref 8.9–10.3)
Chloride: 85 mmol/L — ABNORMAL LOW (ref 98–111)
Creatinine, Ser: 5.77 mg/dL — ABNORMAL HIGH (ref 0.44–1.00)
GFR, Estimated: 9 mL/min — ABNORMAL LOW (ref >60)
Glucose, Bld: 122 mg/dL — ABNORMAL HIGH (ref 70–99)
Phosphorus: 5 mg/dL — ABNORMAL HIGH (ref 2.5–4.6)
Potassium: 3.6 mmol/L (ref 3.5–5.1)
Sodium: 129 mmol/L — ABNORMAL LOW (ref 135–145)

## 2024-04-19 LAB — HEPATITIS B SURFACE ANTIBODY, QUANTITATIVE: Hep B S AB Quant (Post): 79.5 m[IU]/mL

## 2024-04-19 LAB — GLUCOSE, RANDOM: Glucose, Bld: 85 mg/dL (ref 70–99)

## 2024-04-19 LAB — MRSA NEXT GEN BY PCR, NASAL: MRSA by PCR Next Gen: DETECTED — AB

## 2024-04-19 MED ORDER — ALBUMIN HUMAN 25 % IV SOLN
12.5000 g | Freq: Once | INTRAVENOUS | Status: AC
  Administered 2024-04-19: 12.5 g via INTRAVENOUS

## 2024-04-19 NOTE — Progress Notes (Signed)
 Patients blood sugar was checked with glucometer after getting back to unit from dialysis, BS: 28 and then rechecked showing 14. Patient asymptomatic to begin with and was given OJ and a nephro but patient only drank about 75% of nephro and wouldn't drink OJ. MD stated to have lab check BS at same time we did glucometer to see how accurate results were being that patient is a HD patient. D10 gtt was Dc'd, Patient became symptomatic stating that she felt dizzy and just wanted to close her eyes. After getting patient to drink/eat a small amount , Tested patients BS again with glucometer showing a BS of 69 and lab was able to draw blood showing a Bs of 76. MD stated to try and get patient to eat without giving dextrose .

## 2024-04-19 NOTE — Progress Notes (Addendum)
 PT Cancellation Note  Patient Details Name: [name] MRN: [id number] DOB: January 24, 1981   Cancelled Treatment:    Reason Eval/Treat Not Completed:   7:50- Pt at HD. Acute PT to re-attempt as schedule allows  15:13- Pt declined PT eval due to feeling tired after HD. Acute PT to follow.  Orysia Blas, PT, DPT Secure Chat [facility]  [facility] Office [phone]   Alissa April Adela Ades 04/19/2024, 7:50 AM

## 2024-04-19 NOTE — Progress Notes (Signed)
 PROGRESS NOTE    [name]  ZOX:[id number] DOB: Jun 20, 1981 DOA: 04/17/2024 PCP: Senaida Dama, NP    Brief Narrative:  43 y.o. female with past medical history  of atrial flutter history of A-fib, HFrEF with a EF of 30 to 35% on most recent echo, end-stage renal disease on hemodialysis Tuesday Thursday and Saturday schedule, valvular heart disease with mitral stenosis and regurg, aortic insufficiency, admitted with shortness of breath.  On initial presentation patient was tachycardic and dyspneic and started on 3 L nasal cannula.   Subjective:  Patient seen and examined.  Still significantly short of breath on mobility.  At rest she thinks her breathing is better.  Came back from dialysis.  Poor historian. Overnight events noted.  Reportedly hypoglycemic on peripheral capillary blood with no symptoms.  Started on D10.  Hypotensive with dialysis. Telemetry monitor shows sinus rhythm. Assessment & Plan:   Acute hypoxemic resp failure , volume overload, heart failure with reduced ejection fraction and cardiomyopathy with known EF 30%: Shortness of breath is multifactorial.  Volume overload secondary to known cardiomyopathy and inadequate volume removal on dialysis. HD 4/21, 4/22, 4/23 with some clinical improvement.  Start mobilizing.  Keep on oxygen to keep saturation more than 90%.  A flutter with RVR: Treated with amiodarone  drip.  Not on anticoagulation due to recurrent falls.  History of subdural hematoma.  Now sinus rhythm.  Cardiology to convert to oral.  ESRD on hemodialysis: As above.  Volume managed.  Patient on additional midodrine  support.  Hypoglycemia: Likely false.  Patient without any symptoms with capillary blood sugars less than 50.  Stop dextrose  infusion.  Verify fingersticks with blood glucose and treat for symptoms.    DVT prophylaxis: SCDs   Code Status: Full code Family Communication: None at bedside Disposition Plan: Status is: Inpatient Remains  inpatient appropriate because: Volume removal, dialysis     Consultants:  Cardiology Nephrology  Procedures:  Hemodialysis  Antimicrobials:  None     Objective: Vitals:   04/19/24 1030 04/19/24 1125 04/19/24 1143 04/19/24 1220  BP: (!) 87/45 (!) 91/51 (!) 99/55 (!) 95/58  Pulse: 98  97 98  Resp: (!) 27 20 18 19   Temp:   97.7 F (36.5 C) 97.8 F (36.6 C)  TempSrc:    Oral  SpO2: 98%   97%  Weight:      Height:        Intake/Output Summary (Last 24 hours) at 04/19/2024 1248 Last data filed at 04/19/2024 1143 Gross per 24 hour  Intake 1397.95 ml  Output 68.3 ml  Net 1329.65 ml   Filed Weights   04/17/24 0526 04/18/24 0500 04/19/24 0748  Weight: 39 kg 40.1 kg 43.9 kg    Examination:  General exam: Appears calm and comfortable  Chronically sick looking.  Frail and debilitated. Respiratory system: Clear to auscultation. Respiratory effort normal.  Mostly on room air. Cardiovascular system: S1 & S2 heard, RRR.  Gastrointestinal system: Soft and nontender.  Bowel sound present. Central nervous system: Alert and oriented. No focal neurological deficits. Flat affect.  Generalized weakness.    Data Reviewed: I have personally reviewed following labs and imaging studies  CBC: Recent Labs  Lab 04/17/24 0555 04/18/24 0319 04/19/24 0248  WBC 5.2 4.9 4.9  NEUTROABS 2.3  --   --   HGB 12.7 13.1 12.2  HCT 38.5 38.9 37.4  MCV 84.2 83.1 84.2  PLT 175 169 155   Basic Metabolic Panel: Recent Labs  Lab 04/17/24 0555  04/19/24 0807  NA 137 129*  K 5.2* 3.6  CL 87* 85*  CO2 18* 24  GLUCOSE 63* 122*  BUN 50* 52*  CREATININE 6.49* 5.77*  CALCIUM  10.1 9.4  PHOS  --  5.0*   GFR: Estimated Creatinine Clearance: 9 mL/min (A) (by C-G formula based on SCr of 5.77 mg/dL (H)). Liver Function Tests: Recent Labs  Lab 04/17/24 0758 04/19/24 0807  AST 39  --   ALT 23  --   ALKPHOS 352*  --   BILITOT 1.3*  --   PROT 8.1  --   ALBUMIN  2.6* 2.1*   No results for  input(s): "LIPASE", "AMYLASE" in the last 168 hours. No results for input(s): "AMMONIA" in the last 168 hours. Coagulation Profile: No results for input(s): "INR", "PROTIME" in the last 168 hours. Cardiac Enzymes: No results for input(s): "CKTOTAL", "CKMB", "CKMBINDEX", "TROPONINI" in the last 168 hours. BNP (last 3 results) No results for input(s): "PROBNP" in the last 8760 hours. HbA1C: No results for input(s): "HGBA1C" in the last 72 hours. CBG: Recent Labs  Lab 04/19/24 0246 04/19/24 0455 04/19/24 0708 04/19/24 1106 04/19/24 1211  GLUCAP 83 73 78 88 28*   Lipid Profile: No results for input(s): "CHOL", "HDL", "LDLCALC", "TRIG", "CHOLHDL", "LDLDIRECT" in the last 72 hours. Thyroid Function Tests: No results for input(s): "TSH", "T4TOTAL", "FREET4", "T3FREE", "THYROIDAB" in the last 72 hours. Anemia Panel: No results for input(s): "VITAMINB12", "FOLATE", "FERRITIN", "TIBC", "IRON ", "RETICCTPCT" in the last 72 hours. Sepsis Labs: Recent Labs  Lab 04/18/24 0319  LATICACIDVEN 2.7*    Recent Results (from the past 240 hours)  Resp panel by RT-PCR (RSV, Flu A&B, Covid) Anterior Nasal Swab     Status: None   Collection Time: 04/17/24  5:38 AM   Specimen: Anterior Nasal Swab  Result Value Ref Range Status   SARS Coronavirus 2 by RT PCR NEGATIVE NEGATIVE Final   Influenza A by PCR NEGATIVE NEGATIVE Final   Influenza B by PCR NEGATIVE NEGATIVE Final    Comment: (NOTE) The Xpert Xpress SARS-CoV-2/FLU/RSV plus assay is intended as an aid in the diagnosis of influenza from Nasopharyngeal swab specimens and should not be used as a sole basis for treatment. Nasal washings and aspirates are unacceptable for Xpert Xpress SARS-CoV-2/FLU/RSV testing.  Fact Sheet for Patients: BloggerCourse.com  Fact Sheet for Healthcare Providers: SeriousBroker.it  This test is not yet approved or cleared by the [place]  FDA and has been  authorized for detection and/or diagnosis of SARS-CoV-2 by FDA under an Emergency Use Authorization (EUA). This EUA will remain in effect (meaning this test can be used) for the duration of the COVID-19 declaration under Section 564(b)(1) of the Act, 21 U.S.C. section 360bbb-3(b)(1), unless the authorization is terminated or revoked.     Resp Syncytial Virus by PCR NEGATIVE NEGATIVE Final    Comment: (NOTE) Fact Sheet for Patients: BloggerCourse.com  Fact Sheet for Healthcare Providers: SeriousBroker.it  This test is not yet approved or cleared by the [place]  FDA and has been authorized for detection and/or diagnosis of SARS-CoV-2 by FDA under an Emergency Use Authorization (EUA). This EUA will remain in effect (meaning this test can be used) for the duration of the COVID-19 declaration under Section 564(b)(1) of the Act, 21 U.S.C. section 360bbb-3(b)(1), unless the authorization is terminated or revoked.  Performed at [facility], [address], [place], [place] [address]          Radiology Studies: No results found.  Scheduled Meds:  cinacalcet   30 mg Oral QPM   feeding supplement (NEPRO CARB STEADY)  237 mL Oral TID BM   melatonin  3 mg Oral QHS   midodrine   10 mg Oral TID WC   multivitamin  1 tablet Oral QHS   sevelamer  carbonate  2,400 mg Oral TID with meals   sodium chloride  flush  3 mL Intravenous Q12H   Continuous Infusions:  amiodarone  30 mg/hr (04/18/24 2349)     LOS: 2 days    Time spent: 40 minutes    Vada Garibaldi, MD [facility]

## 2024-04-19 NOTE — Progress Notes (Signed)
 Hypoglycemic Event  CBG: 40   Treatment: D50 50 mL (25 gm)  Symptoms: None  Follow-up CBG: Time: CBG Result:  Possible Reasons for Event: Unknown  Comments/MD notified: Dr. Brock Canner notified. Orders placed for D10 at 25ml/hour. Patient also given 25G of dextrose  prior to starting D10    Loman Risk

## 2024-04-19 NOTE — Progress Notes (Signed)
 [facility] night cross cover note:   D10 at 50 cc/hr started in the context of recurrent hypoglycemia, refractory to multiple amps of D50.    [name] Cavalier, DO Hospitalist

## 2024-04-19 NOTE — Discharge Instructions (Signed)
 [employer] Us assistance programs Crisis assistance programs  -[facility]eless[facility] Arts development officer. If you are experiencing homelessness in [place], [place] , your first point of contact should be Pensions consultant. You can reach Coordinated Entry by calling [phone] or by emailing [email or web address].  Community access points: [employer] [phone] [address], [place]) every Tuesday from 9am-10am. [facility] ([address] [place], [place]) every Wednesday from 8am-9am.   -[place] Coordinated Re-entry Jayson Michael: Dial 211 and request. Offers referrals to homeless shelters in the area.    -The [employer] [phone]) offers several services to local families, as funding allows. The Emergency Assistance Program (EAP), which they administer, provides household goods, free food, clothing, and financial aid to people in need in the [place]  area. The EAP program does have some qualification, and counselors will interview clients for financial assistance by written referral only. Referrals need to be made by the Department of Social Services or by other EAP approved human services agencies or charities in the area.  -Open Door Ministries of [employer], which can be reached at [phone], offers emergency assistance programs for those in need of help, such as food, rent assistance, a soup kitchen, shelter, and clothing. They are based in [facility]  but provide a number of services to those that qualify for assistance.   [facility] Department of Social Services may be able to offer temporary financial assistance and cash grants for paying rent and utilities, Help may be provided for local county residents who may be experiencing personal crisis when other resources, including government programs, are not available. Call [phone]  -High [employer] Army is a [employer] agency, The organization can offer emergency assistance for paying rent, Caremark Rx, utilities, food, household products and furniture. They offer extensive emergency and transitional housing for families, children and single women, and also run a Boy's and Dole Food. Thrift Shops, Secondary school teacher, and other aid offered too. [address] [place], [place]  [address], [phone]  -[place] Low Income Energy Assistance Program -- This is offered for [facility] families. The federal government created CIT Group Program provides a one-time cash grant payment to help eligible low-income families pay their electric and heating bills. [address] [place], [place]  [address], [phone]  -[place] Emergency Assistance -- A program offers emergency utility and rent funds for greater [employer] area residents. The program can also provide counseling and referrals to charities and government programs. Also provides food and a free meal program that serves lunch Mondays - Saturdays and dinner seven days per week to individuals in the community. [address] [place], [place]  [address], [phone]  -[employer] - Offers affordable apartment and housing communities across      [place] and [place]. The low income and seniors can access public housing, rental assistance to qualified applicants, and apply for the section 8 rent subsidy program. Other programs include Chiropractor and Engineer, maintenance. [address] [place], [place]  [address], dial [phone].  -The [facility] provides transitional housing to veterans and the disabled. Clients will also access other services too, including assistance in applying for Disability, life skills classes, case management, and assistance in finding permanent housing. [address] [place], [place]  [place] [address], call [phone]  -[place] Transitional Housing through [facility] is for people who were just  evicted or that are formerly homeless. The non-profit will also help then gain self-sufficiency, find a home or apartment to live in, and also provides information on rent assistance when needed. Phone [phone]  -The [place] [facility] [facility] helps low income, elderly, or disabled residents in seven counties in the [place] ([place], [place], [place], [place], [place], [place], [place], and [place]) save energy and reduce their utility bills by improving energy efficiency. Phone [phone].  -[employer] is located in the [facility] in the [employer], [address], [place], [place] [address]. Parking is in the rear of the building. Phone: [phone]   General Email: info@gsohc .org  GHC provides free housing counseling assistance in locating affordable rental housing or housing with support services for families and individuals in crisis and the chronically homeless. We provide potential resources for other housing needs like utilities. Our trained counselors also work with clients on budgeting and financial literacy in effort to empower them to take control of their financial situations. [employer] collaborates with homeless service providers and other stakeholders as part of the [employer] COC (Continuum of Care). The (COC) is a regional/local planning body that coordinates housing and services funding for homeless families and individuals. The role of GHC in the COC is through housing counseling to work with people we serve on diversion strategies for those that are at imminent risk of becoming homeless. We also work with the Coordinated Assessment/Entry Specialist who attempts to find temporary solutions and/or connects the people  to Housing First, Rapid Re-housing or transitional housing programs. Our Homelessness Prevention Housing Counselors meet with clients on business days (Monday-Fridays, except scheduled holidays) from 8:30 am to 4:30 pm.  Legal assistance for evictions, foreclosure, and more -If you need free legal advice on civil issues, such as foreclosures, evictions, Electronics engineer, government programs, domestic issues and more, Landscape architect of [place]  [facility]) is a Associate Professor firm that provides free legal services and counsel to lower income people, seniors, disabled, and others, The goal is to ensure everyone has access to justice and fair representation. Call them at [phone].  [facility] Studies can provide info about obtaining legal assistance with evictions. Phone [phone].  Data processing manager  The [employer], [employer]. offers job and Dispensing optician. Resources are focused on helping students obtain the skills and experiences that are necessary to compete in today's challenging and tight job market. The non-profit faith-based community action agency offers internship trainings as well as classroom instruction. Classes are tailored to meet the needs of people in the [facility] region. [place], [place] [address], [phone]  Foreclosure prevention/Debt Services Family Services of the [employer] Credit Counseling Service inludes debt and foreclosure prevention programs for local families. This includes money management, financial advice, budget review and development of a written action plan with a Pensions consultant to help solve specific individual financial problems. In addition, housing and mortgage counselors can also provide pre- and post-purchase homeownership counseling, default resolution counseling (to prevent foreclosure) and reverse mortgage counseling. A Debt Management Program allows  people and families with a high level of credit card or medical debt to consolidate and repay consumer debt and loans to creditors and rebuild positive credit ratings and scores. Contact [phone].  Community clinics in [place] -Health Department [facility]: [address] [place], [place], [address]. [phone].  -Health Department [place] [facility]: [phone]  [address], [facility], [address]. [phone].  -[facility] offers medical care through a group of doctors, pharmacies and other healthcare related agencies that offer services for low income, uninsured adults in [place]. Also offers adult Dental care and assistance with applying for an [employer]. Call [phone].   [facility]. This center provides low-cost health care to those without health insurance. Services offered include an onsite pharmacy. Phone [phone]. [address], [place].  -Medication Assistance Program serves as a link between pharmaceutical companies and patients to provide low cost or free prescription medications. This service is available for residents who meet certain income restrictions and have no insurance coverage. PLEASE CALL [phone] Jonette Nestle) OR [phone] ([place])  -One Step Further: Materials engineer, The [employer] Support & Nutrition Program, [employer]. Call [phone]/ [phone].  Food pantry and assistance -Urban Ministry-Food Bank: [address], [place] [address]. Phone [phone]  -Blessed Table Food Pantry: [address], [place], [place] [address]. [phone].  -Missionary Ministry: has the purpose of visiting the sick and shut-ins and provide for needs in the surrounding communities. Call [phone]. Email: [email or web address] This program provides: Food box for seniors, Financial assistance, Food to meet basic  nutritional needs.  -Meals on Wheels with Senior Resources: [place] residents age 49 and over who are homebound and unable to obtain and prepare a nutritious meal for themselves are eligible for this service. There may be a waiting list in certain parts of [facility] if the route in that area is full. If you are in [facility] and [place] call [phone] to register. For all other areas call [phone] to register.  -Greater Dietitian: [email or web address]  TRANSPORTATION: -[employer]ys [employer] Us Department of Health: Call [facility] and [employer] at ([phone] for details. AttractionGuides.es  -Access [place]: Access [place] is the [employer] Agency's shared-ride transportation service for eligible riders who have a disability that prevents them from riding the fixed route bus. Call [phone]. Access [place] riders must pay a fare of $1.50 per trip, or may purchase a 10-ride punch card for $14.00 ($1.40 per ride) or a 40-ride punch card for $48.00 ($1.20 per ride).  -The Shepherd's WHEELS rideshare transportation service is provided for senior citizens (60+) who live independently within [place] city limits and are unable to drive or have limited access to transportation. Call [phone] to schedule an appointment.  -[place] Transportation: For Medicare or Medicaid recipients call [phone]?[facility], wheelchair Carloyn Chi, and ambulatory quotes available.   FLEEING VIOLENCE: -Family Services of the [place]- 24/7 Crisis line ([phone]) -[facility] Justice Centers: (336) [phone]-[phone] [phone])  [place] 2-1-1 is another useful way to locate resources in the community. Visit ShedSizes.ch to find service information online. If you need additional assistance, 2-1-1 Referral Specialists are available 24 hours a day, every day by dialing  2-1-1 or [phone] from any phone. The call is free, confidential, and available in any language.  Affordable Housing Search http://www.nchousingsearch.[facility])   M-F 8a-3p [address]  [place], [place] [address] [phone] Services include: laundry, barbering, support groups, case management, phone & computer access, showers, AA/NA mtgs, mental health/substance abuse nurse, job skills class, disability information, VA assistance, spiritual classes, etc. Winter Shelter available when temperatures are less than 32 degrees.   HOMELESS SHELTERS [facility] at [facility]- Call [phone] ext. [phone]  or ext. [phone]. Located at [address] [place], [place] [address]  [facility] Mens Shelter- Call [phone]. Located at [address] [place] [address].  [facility]- [employer]. Call [phone]. Office located at [address], [place] [address].  Pathways Family Housing through [place] [phone].  [facility] Family Shelter- Call [phone]. Located at [address] [place], [place], [place] [address].  Room at the Inn-For Pregnant mothers. Call [phone]. Located at [address] [place] [address] [place], [address].  [facility] of Hope-For men in [place]. Call [phone]. [address]-Shelter in [place]. Call [phone].  Home of [employer] for [employer] [phone]. Office located at [address] [place], [address].  [employer] be agreeable to help with chores. Call [phone] ext. [phone].  Men's: [address] [place], [place] [address]. Women's: [facility]  [address] [place], [place] [address]  Crisis Services Therapeutic Alternatives Mobile Crisis Management- [phone]  [facility] [address] [place], [place] [address]. Phone: [phone] Homeless Shelter  List:  Health and safety inspector [facility]) [address] [place], [place]  Phone: [phone]  [facility] Men's Shelter [address] [place], [place] [address] Phone: [phone]  [facility] (Women only) [address]Margarita Shear [place], [place] [address] Phone: [phone]  [facility] [address], [place] [address] Phone: [phone]  [facility] of Hope: [phone]. [address] [place], [place] [address] Phone: [phone]  [facility] Overflow Shelter  [address], [place], [place] [address] (Check in at 6:00PM for placement at a local shelter) Phone: [phone]    [facility] assistance programs. If you are behind on your bills and expenses, and need some help to make it through a short term hardship or financial emergency, there are several organizations and charities in the [place] and [place] area that may be able to help. They range from the Pathmark Stores, [employer] Global, Landscape architect of [place]  and the local community action agency, the [employer], [employer]. These groups may be able to provide you resources to help pay your utility bills, rent, and they even offer housing assistance.  Crisis assistance program Find help for paying your rent, electric bills, free food, and even funds to pay your mortgage. The [employer] 703-436-0749) offers several services to local families, as funding allows. The Emergency Assistance Program (EAP), which they administer, provides household goods, free food, clothing, and financial aid to people in need in the [place]  area. The EAP program does have some qualification, and counselors will interview clients for financial assistance by written referral only. Referrals need to be made by the Department of Social Services or by other EAP approved human services agencies or  charities in the area.  Money for resources for emergency assistance are available for security deposits for rent, water , electric, and gas, past due rent, utility bills, past due mortgage payments, food, and clothing. The [employer] also operates a Programme researcher, broadcasting/film/video on the site. More Liberty Global.  [facility] of [place], which can be reached at [phone], offers emergency assistance programs for those in need of help, such as food, rent assistance, a soup kitchen, shelter, and clothing. They are based in [facility] [place]  but provide a number of services to those that qualify for  assistance. Continue with Open Door Ministries programs.  [facility] Department of Social Services may be able to offer temporary financial assistance and cash grants for paying rent and utilities. Help may be provided for local county residents who may be experiencing personal crisis when other resources, including government programs, are not available. Call [phone]  [facility]. [facility] Favor Society, which is based in [place], provides financial assistance of up to $50.00 to help pay for rent, utilities, cooling bills, rent, and prescription medications. The program also provides secondhand furniture to those in need. [phone]  [employer] is a Geneticist, molecular. The organization can offer emergency assistance for paying rent, electric bills, utilities, food, household products and furniture. They offer extensive emergency and transitional housing for families, children and single women, and also run a Boy's and Dole Food. 301 Thrift Shops, [employer], and other aid offered too. [address] [place], [place]  [address], [phone]  Additional locations of the Path[employer] Stores are in [place] and other nearby communities. When you have an emergency, need free food, money for basic needs, or just need assistance  around Christmas, then the Pathmark Stores may have the resources you need. Or they can refer you to nearby agencies. Learn more.  [place] Low Income Risk manager - This is offered for [facility] families. The federal government created CIT Group Program provides a one-time cash grant payment to help eligible low-income families pay their electric and heating bills. [address], [place], [place]  [address], [phone]  Government and [employer] - The county administers several emergency and self-sufficiency programs. Residents of [place] can get help with energy bills and food, rent, and other expenses. In addition, work with a Sports coach who may be able to help you find a job or improve your employment skills. More [place] public assistance.  [place] Emergency Assistance - A program offers emergency utility and rent funds for greater [place] area residents. The program can also provide counseling and referrals to charities and government programs. Also provides food and a free meal program that serves lunch Mondays - Saturdays and dinner seven days per week to individuals in the community. [address], [place], [place]  [address], [phone]  [employer] - Offers affordable apartment and housing communities across [place] and [place]. The low income and seniors can access public housing, rental assistance to qualified applicants, and apply for the section 8 rent subsidy program. Other programs include Chiropractor and Engineer, maintenance. [address], [place], [place]  [address], dial [phone].  Basic needs such as clothing - Low income families can receive free items (school supplies, clothes, holiday assistance, etc.) from clothing closets while more moderate income [address] families can shop at Caremark Rx. Locations  across the area help the needy. Get information on [place] free clothing centers.  The [facility] provides transitional housing to veterans and the disabled. Clients will also access other services too, including life skills classes, case management, and assistance in finding permanent housing. [address], [place], [place]  [address], call [phone]  [facility] Transitional Housing in [place] is for people who were just evicted or that are formerly homeless. The non-profit will also help then gain self-sufficiency, find a home or apartment to live in, and also provides information on rent assistance when needed. Dial [phone]  [employer] Partnership to  End Homelessness is available in [facility]. Families that were evicted or that are homeless can gain shelter, food, clothing, furniture, and also emergency financial assistance. Other services include financial skills and life skills coaching, job training, and case management. [address], [place], [place] [address]. Telephone [phone].  The [employer], [employer]. runs the [employer]. This can help people save money on their heating and summer cooling bills, and is free to low income families. Free upgrades can be made to your home. Phone [phone]  Many of the non-profits and programs mentioned above are all inclusive, meaning they can meet many needs of the low income, such as energy bills, food, rent, and more. However there are several organizations that focus just on rent and housing. Read more on rent assistance in [place] region.  Legal assistance for evictions, foreclosure, and more If you need free legal advice on civili issues, such as foreclosures, evictions, Electronics engineer, government programs, domestic issues and more, Armed forces operational officer Aid of [place]  [facility]) is a Associate Professor firm that provides free legal services and counsel to lower  income people, seniors, disabled, and others. The goal is to ensure everyone has access to justice and fair representation.  Call them at [phone], or click here to learn more about [place]  free legal assistance programs.  [place] [employer]net and funds for emergency expenses The Pathmark Stores is another organization that can provide people with [employer] and funds to pay bills. Their assistance depends on funding, and the demand for help is always very high. They can provide cash to help pay rent, a missed mortgage payment, or gas, electric, and water  bills. But the assistance doesn't stop there. They also have a food pantry on site, which can provide food once every three (3) months to people who need help. The [employer] can also offer a Engineering geologist once every three (3) months for a maximum three (3) times. After receiving this voucher over that period of time, applicants can receive this aid one every six (6) months after that. [phone].  Kohl's action agency The Intel, [employer]. offers job and Dispensing optician. Resources are focused on helping students obtain the skills and experiences that are necessary to compete in today's challenging and tight job market. The non-profit faith-based community action agency offers internship trainings as well as classroom instruction. Economically disadvantaged and challenged individuals and potential employers can use their services. Classes are tailored to meet the needs of people in the [facility] region. [place], [place] [address], [phone]    Foreclosure prevention services Housing Counseling and Education is also offered by [employer] of the [place]. The agency (phone number is below) is a Engineer, structural providing foreclosure advice and counseling. They offer mortgage resolution counseling and also reverse mortgage counseling.  Counselors can direct people to both [employer]ark, as well as [place]  foreclosure assistance options.  Warehouse manager has locations in [place] and [place]. They run debt and foreclosure prevention programs for local families. A sampling of the programs offered include both Budget and Housing Counseling. This includes money management, financial advice, budget review and development of a written action plan with a Pensions consultant to help solve specific individual financial problems. In addition, housing and mortgage counselors can also provide pre- and post-purchase homeownership counseling, default resolution counseling (to prevent foreclosure) and reverse mortgage counseling.  A Debt Management Program allows people and families with a high level of credit card or medical debt to consolidate and repay consumer debt and loans to creditors and rebuild positive credit ratings and scores. [phone]  Debt assistance programs Receive free counseling and debt help from [facility] the [place]. The [facility] based agency can be reached at [phone]. The counselors provide free help, and the services include budget counseling. This will help people manage their expenses and set goals. They also offer a Forensic scientist, which will help individuals consolidate their debts and become debt free. Most of the workshops and services are free.  Community clinics in [place] of the leading health and dental centers are listed below. They may be able to provide medication, physicals, dental care, and general family care to residents of all incomes and backgrounds across the region. Some of the programs focus on the low income and underinsured. However if these clinics can't meet your needs, find information and details on more clinics in [place] .  Some of the options  include [employer] [facility] [employer]. This center provides free or low cost health care to low-income adults 18 - 64, who have no health insurance. Among other services offered include a pharmacy and eye clinic. Phone [phone]  [facility], which is located in [place], is a community clinic that provides primary medical and health care to uninsured and underinsured adults and families, as well as the low income, in the greater [place] area on a sliding-fee scale. Call [phone]  [place] [facility] - They run a dental assistance program that is organized by [facility] to provide dental services and aid to [employer]. Services offered by the dental clinic are limited to extractions, pain management, and minor restorative care. [phone]  [place] [facility] has locations in [facility] and [place]. The community clinics provide complete pediatric care including primary health, mental health, social work, neurology, cardiology, asthma. Dial [phone].  In addition to those [place] and [employer] community clinics, find other free community clinics in [place]  and across the county.  Food pantry and assistance Some of the local food pantries and distribution centers to call for free food and groceries include [employer] [facility] [place] (phone [phone]), [facility] (phone [phone]) and also [employer]. Dial [phone].  Several other food banks in the region provide clothing, free food and meals, access to soup kitchens and other help. Find the addresses and phone numbers of more food pantries in [place]. http://www.needhelppayingbills.com/html/[place]_assistance_pro.html  Rent/Utility Assistance in [facility]:  [facility] [address] [place], [place] [address] [phone] Mon 8:00am - 6:00pm; Tue 8:00am - 6:00pm;  Wed 8:00am - 6:00pm; Thu 8:00am - 6:00pm; Fri 8:00am - 6:00pm; Email: [email or web address] [email or web address] Eligibility: Residents of [place], [place], [place], [place], [place] and [place] that meet income limits. Call or text for eligibility screening.   [facility] [address] [place], [place], [place] [address] [phone] (Main: Rental Assistance) [phone] (Main: Utility Assistance) Mon 8:30am - 5:00pm; Tue 8:30am - 5:00pm; Wed 8:30am - 5:00pm; Thu 8:30am - 5:00pm; Fri 8:30am - 5:00pm; Website: http://www.greensborourbanministry.org/emergency-assistance-program Eligibility: People who have an unexpected crisis or emergency that can be verified. Must have some form of income and meet income limits. At the first of the month, only helps with rent/mortgage assistance for those who have court ordered  eviction notices. Call for application information. Call for exact documents that will be needed. Examples of documents that may be needed: Photo ID, Social Security cards for everyone in the household, and proof of income for previous 2 months. Copy of eviction notice for rent assistance and copy of final notice for utility assistance. Statements or receipts of bills for previous 2 months.   [facility] [place] [address], [place], [place] [address] [phone] (Main) [phone] (Alternate) Mon 9:00am - 5:00pm; Tue 9:00am - 5:00pm; Wed 9:00am - 5:00pm; Thu 9:00am - 5:00pm; Fri 9:00am - 5:00pm; Website: http://[email or web address].[email or web address]/[place]/emergency-financial-assistance Email: [email or web address] Eligibility: People experiencing a housing crisis with past-due rent and/or utilities and meet income limits. Must be willing to take part in 6 Call or visit website to download application. Return complete application by mail or email only. Documents: Help with Utilities: Photo ID, proof of household income, copies of monthly  bills or receipts, and a final disconnection/shut-off notice. Help with Rent or Mortgage: Photo ID, proof of income, copies of monthly bills or receipts, and eviction notice. Help with Household Goods: Photo ID, proof of household income, copies of monthly bills or receipts, and a fire or flood report.  [facility] [place] [address] [place], [place] [address] [phone] (Main) Mon 8:00am - 5:00pm; Tue 8:00am - 5:00pm; Wed 8:00am - 5:00pm; Thu 8:00am - 5:00pm; Fri 8:00am - 12:00pm; Website: http://[email or web address].salvationarmy.org/[place]/emergency-financial-assistance Email: [email or web address] Call for eligibility information. Apply :Utilities Assistance: Visit office by 8:30am on 1st and 4th Monday of each month to pick up application. Rent and Mortgage Assistance: Visit office by 8:30am on 2nd and 3rd Monday of each month to pick up application. NOTE: If Monday falls on a holiday applications can be picked up the following Tuesday. Documents required will be listed on application.  [facility] [place] [phone] (Main) Seen by appointment only. Call for more information. Eligibility: Meet income limits. Apply: Call for information on how to schedule an appointment. Each month there is a specific day to call to schedule an appointment. It is stated on the agency voicemail message. Appointments fill up quickly each month. Documents: Photo ID, copy of current utility bill.  [facility] [place] [address] [place], [place] [address] [phone] (Main) Tue 9:00am - 4:00pm; Wed 9:00am - 4:00pm; Thu 9:00am - 4:00pm; Website: http://www.helpinghandshighpoint.org Email: helpinghandsclientassistance@gmail .com Eligibility: Utility Assistance: Meet income limits and be a Holiday representative. [employer] customers do not qualify. Must not have received utility assistance for another agency within the last 90  days. Rent Assistance: Residents of [employer] who meet income limits. Must not have received rent assistance for another agency within the last 90 days. Apply: Call to schedule an appointment. Documents: Utility Assistance: Photo ID, [place] [employer], copy of lease (if not paying a mortgage), proof of income, and monthly expenses. Rent Assistance: Photo ID, W-9 from the landlord, copy of the lease, proof of income, and a list of monthly expenses.  [facility] [place] [address], [place], [place] [address] [phone] (Main: Help With Rent) [phone] (Main: Help With Utilities) Mon 9:00am - 4:00pm; Tue 9:00am - 4:00pm; Wed 9:00am - 4:00pm; Thu 9:00am - 4:00pm; Fri 9:00am - 4:00pm; Website: [email or web address].no Email: [email or web address] Eligibility: People experiencing a financial crisis. Apply: Call to schedule an appointment Wednesday, 7:30am. Documents: Photo ID, Social Security card, proof of income, and  proof of address. Other documents may be required, depending on service. Call for more information.  LOW INCOME ENERGY ASSISTANCE PROGRAM DEPARTMENT OF SOCIAL SERVICES - [facility] [address], [place], [place] [address] [phone] (Main) Mon 8:00am - 5:00pm; Tue 8:00am - 5:00pm; Wed 8:00am - 5:00pm; Thu 8:00am - 5:00pm; Fri 8:00am - 5:00pm; Website: [email or web address] Eligibility: Meet income limits and resource guidelines. Each household is only eligible once, even if multiple members apply. Apply: Call to see if funds are available. Visit to complete an application, call to have 1 mailed, or apply online at [email or web address] NOTE: Households with a person age 61 and over or a person with a documented disability can apply beginning December 1. Other households can apply beginning January 1. Documents: Photo  ID, birth certificate, proof of household income, copy of utility bill, latest bank statement, the names and Social Security numbers for everyone in the household, and proof of disability if under age 56.  LOW INCOME ENERGY ASSISTANCE PROGRAM DEPARTMENT OF SOCIAL SERVICES - [facility] [address] [place], [place], [place] [address] [phone] (Main) Mon 8:00am - 5:00pm; Tue 8:00am - 5:00pm; Wed 8:00am - 5:00pm; Thu 8:00am - 5:00pm; Fri 8:00am - 5:00pm; Website: [email or web address] Eligibility: Meet income limits and resource guidelines. Each household is only eligible once, even if multiple members apply. Apply: Call to see if funds are available. Visit to complete an application, call to have 1 mailed, or apply online at [email or web address] NOTE: Households with a person age 82 and over or a person with a documented disability can apply beginning December 1. Other households can apply beginning January 1. Documents: Photo ID, birth certificate, proof of household income, copy of utility bill, latest bank statement, the names and Social Security numbers for everyone in the household, and proof of disability if under age 54.

## 2024-04-19 NOTE — Plan of Care (Signed)
   Problem: Coping: Goal: Level of anxiety will decrease Outcome: Progressing

## 2024-04-19 NOTE — TOC Initial Note (Addendum)
 Transition of Care [facility]) - Initial/Assessment Note    Patient Details  Name: [name] MRN: [id number] Date of Birth: 1981/07/17  [facility]) CM/SW Contact:    Juliane Och, LCSW Phone Number: 04/19/2024, 2:37 PM  Clinical Narrative:                  2:37 PM CSW introduced self and role to patient at bedside. Patient confirmed that she resides in an apartment with mother and aunt. Patient stated that she used insurance benefits for transportation. CSW offered additional transportation resources. Patient accepted CSW offer and expressed interest in housing resources as well. CSW provided patient with housing, utility, and transportation resources. CSW added resources to patient's AVS, as well. Patient confirmed that she receives food stamps but declined CSW offer of list of food pantries. Patient confirmed that her family could provide transportation and home support if needed upon discharge. Patient stated that the only DME she has at home are crutches. Patient confirmed SNF history [facility]) and [facility] history (agency visited once and patient did not remember agency's name).  Expected Discharge Plan: Home/Self Care Barriers to Discharge: Continued Medical Work up   Patient Goals and CMS Choice Patient states their goals for this hospitalization and ongoing recovery are:: to return home          Expected Discharge Plan and Services       Living arrangements for the past 2 months: Apartment                                      Prior Living Arrangements/Services Living arrangements for the past 2 months: Apartment Lives with:: Relatives, Parents Patient language and need for interpreter reviewed:: Yes Do you feel safe going back to the place where you live?: Yes      Need for Family Participation in Patient Care: No (Comment) Care giver support system in place?: Yes (comment)   Criminal Activity/Legal Involvement Pertinent to Current  Situation/Hospitalization: No - Comment as needed  Activities of Daily Living   ADL Screening (condition at time of admission) Independently performs ADLs?: No Does the patient have a NEW difficulty with bathing/dressing/toileting/self-feeding that is expected to last >3 days?: No Does the patient have a NEW difficulty with getting in/out of bed, walking, or climbing stairs that is expected to last >3 days?: No Does the patient have a NEW difficulty with communication that is expected to last >3 days?: No Is the patient deaf or have difficulty hearing?: No Does the patient have difficulty seeing, even when wearing glasses/contacts?: No Does the patient have difficulty concentrating, remembering, or making decisions?: No  Permission Sought/Granted Permission sought to share information with : Family Supports Permission granted to share information with : No (Contact information on chart)  Share Information with NAME: Lynea Rollison     Permission granted to share info w Relationship: Mother  Permission granted to share info w Contact Information: [phone]  Emotional Assessment Appearance:: Appears stated age Attitude/Demeanor/Rapport: Engaged Affect (typically observed): Accepting, Appropriate, Adaptable, Calm, Pleasant Orientation: : Oriented to Self, Oriented to Place, Oriented to  Time, Oriented to Situation Alcohol  / Substance Use: Not Applicable Psych Involvement: No (comment)  Admission diagnosis:  Hyperkalemia [E87.5] Acute pulmonary edema (HCC) [J81.0] SOB (shortness of breath) [R06.02] Hypoglycemia [E16.2] Pulmonary nodule [R91.1] Atrial flutter with rapid ventricular response (HCC) [I48.92] ESRD on hemodialysis (HCC) [N18.6, Z99.2] Chronic  respiratory failure, unspecified whether with hypoxia or hypercapnia (HCC) [J96.10] Patient Active Problem List   Diagnosis Date Noted   SOB (shortness of breath) 04/17/2024   Protein-calorie malnutrition, severe 01/28/2024    Acute respiratory failure (HCC) 01/23/2024   Atrial flutter (HCC) 01/23/2024   Hypotension 01/23/2024   Secondary hypercoagulability disorder (HCC) 01/23/2024   Open thigh wound 01/23/2024   Thrombocytopenia (HCC) 12/14/2023   Atrial fibrillation with RVR (HCC) 12/14/2023   Acute on chronic heart failure (HCC) 12/06/2022   Seizures (HCC) 12/04/2022   R Subdural hematoma (HCC) 12/04/2022   Acute on chronic systolic CHF (congestive heart failure) (HCC) 11/30/2022   Nonrheumatic aortic valve insufficiency 11/30/2022   Nonrheumatic mitral valve regurgitation 11/30/2022   Non-ischemic cardiomyopathy (HCC) 11/30/2022   Acute febrile illness 04/12/2022   Hypertensive urgency 04/12/2022   GERD (gastroesophageal reflux disease) 04/12/2022   HFrEF (heart failure with reduced ejection fraction) (HCC) 04/12/2022   Elevated troponin 04/12/2022   SIRS (systemic inflammatory response syndrome) (HCC) 04/12/2022   Hyperkalemia, diminished renal excretion 12/15/2021   Trichomonas vaginitis 03/25/2021   Bacterial vaginitis 03/25/2021   Encounter for screening for COVID-19 01/24/2021   Hemodialysis access, fistula mature (HCC) 01/22/2021   Allergic reaction 01/22/2021   Acute respiratory failure with hypoxia (HCC) 11/02/2020   Acute hyperkalemia 07/28/2020   Nicotine  dependence, cigarettes, uncomplicated 07/28/2020   Essential hypertension 07/28/2020   Substance abuse (HCC) 07/28/2020   Hemodialysis catheter malfunction (HCC) 07/28/2020   Complication of AV dialysis fistula, sequela 07/28/2020   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HCC) 05/18/2020   Bacterial intestinal infection, unspecified 12/14/2019   Left shoulder pain 08/15/2019   Cellulitis, unspecified 05/02/2019   Hyperkalemia 07/29/2018   SVT (supraventricular tachycardia) (HCC) 07/16/2018   Cardiomyopathy, unspecified (HCC) 12/03/2016   Diarrhea 04/18/2016   Chronic combined systolic (congestive) and diastolic (congestive) heart failure (HCC)  03/16/2016   Other hypervolemia    S/P thoracentesis    Cough with hemoptysis    Myalgia    Pulmonary edema    ESRD (end stage renal disease) (HCC) 03/03/2016   Dependence on renal dialysis (HCC) 10/15/2015   Rash and nonspecific skin eruption 06/26/2015   Secondary Raynaud's phenomenon 06/24/2015   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   Fever, unspecified 08/28/2014   Pruritus, unspecified 08/28/2014   Headache, unspecified 04/30/2014   Pain, unspecified 04/30/2014   Cramping of hands 04/19/2014   Contraceptive management 04/19/2014   Mild protein-calorie malnutrition (HCC) 03/22/2014   Insomnia 03/15/2014   Benign hypertension with ESRD (end-stage renal disease) (HCC) 03/15/2014   Tobacco abuse 02/15/2014   Healthcare maintenance 02/15/2014   Iron  deficiency anemia, unspecified 02/12/2014   Coagulation defect, unspecified (HCC) 02/10/2014   Anemia due to chronic kidney disease 02/09/2014   Secondary hyperparathyroidism of renal origin (HCC) 02/09/2014   Hypoalbuminemia 02/01/2014   Nephrotic syndrome 02/01/2014   ESRD on dialysis (HCC) 01/31/2014   Pleural effusion, left 01/31/2014   Microcytic anemia 01/29/2014   Hypocalcemia 01/29/2014   Bacteremia 01/23/2013   Marijuana smoker (HCC) 01/18/2013   H/O pericarditis 01/17/2013   Lupus (systemic lupus erythematosus) (HCC) 01/17/2013   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   S/P pericardiocentesis 01/17/2013   H/O pleural effusion 01/17/2013   Nephrosis 01/17/2013   Preseptal cellulitis 01/17/2013   Cardiac tamponade 12/29/2004   Pericardial effusion (noninflammatory) 12/29/2004   PCP:  Senaida Dama, NP Pharmacy:   [facility] [phone] - [place], [place] - [address]  [place] [address] Phone: [phone] Fax: [phone]     Social Drivers of Health (SDOH) Social History: SDOH Screenings   Food Insecurity: Patient Declined (04/17/2024)   Housing: Patient Declined (04/17/2024)  Transportation Needs: Patient Declined (04/17/2024)  Utilities: Patient Declined (04/17/2024)  Depression (PHQ2-9): Low Risk  (04/29/2022)  Tobacco Use: High Risk (04/17/2024)   SDOH Interventions: Food Insecurity Interventions: Patient Declined Housing Interventions: Patient Declined Utilities Interventions: Patient Declined   Readmission Risk Interventions    04/17/2022    8:36 AM  Readmission Risk Prevention Plan  Transportation Screening Complete  PCP or Specialist Appt within 3-5 Days Complete  HRI or Home Care Consult Complete  Social Work Consult for Recovery Care Planning/Counseling Complete  Palliative Care Screening Not Applicable  Medication Review Oceanographer) Complete

## 2024-04-19 NOTE — Progress Notes (Addendum)
 Patient Name: [name] Date of Encounter: 04/19/2024 [facility] Cardiologist: Sheryle Donning, MD   Interval Summary  .    43 year old female with PMH of ESRD on dialysis, polysubstance abuse, lupus nephritis, heart failure with reduced EF, hypotension, valvular heart disease, chronic anemia, atrial flutter/fibrillation, who is admitted for acute hypoxic respiratory failure secondary to volume overload.  Cardiology is following for atrial flutter with RVR and elevated troponin and acute heart failure with reduced EF.   Patient seen during dialysis, she is mildly irritated, states " just get to the point" when questioned ask about her health.  She states she is tired, experience dizziness and shortness of breath when she moves around 1.5 days ago, denied any chest pain.  She is not sure why she is not on blood thinner historically and states she is heparin  drip right now.  She states she does not fall frequently at home.  Vital Signs .    Vitals:   04/19/24 0807 04/19/24 0830 04/19/24 0900 04/19/24 0930  BP: (!) 87/55 (!) 95/49 (!) 88/48 (!) 83/41  Pulse: (!) 36 98 97 (!) 26  Resp: 18 18 17 18   Temp:      TempSrc:      SpO2: (!) 69%   100%  Weight:      Height:        Intake/Output Summary (Last 24 hours) at 04/19/2024 0934 Last data filed at 04/19/2024 0400 Gross per 24 hour  Intake 1397.95 ml  Output --  Net 1397.95 ml      04/19/2024    7:48 AM 04/18/2024    5:00 AM 04/17/2024    5:56 PM  Last 3 Weights  Weight (lbs) 96 lb 12.5 oz 88 lb 6.5 oz --  Weight (kg) 43.9 kg 40.1 kg --      Telemetry/ECG    Atrial flutter with ventricular rate of 90s- Personally Reviewed  Physical Exam .   GEN: No acute distress.   Neck: No JVD Cardiac: Regular rhythm, grade 3 systolic murmur  Respiratory: Clear to auscultation bilaterally.  On 3 L nasal cannula oxygen GI: Soft, nontender, non-distended  MS: No leg edema  Assessment & Plan .     Atrial flutter  with RVR - Appears has been diagnosed with atrial flutter since 11/2023 hospitalization in the setting of sepsis, has moderate to severe valvular disease, not a candidate for surgical repair of valvular issues, has severe LAE, felt a poor candidate for rhythm control, also not on anticoagulation historically due to subdural hematoma 11/2022 and frequent falls at home, PTA amiodarone  and intermittently misses doses -Presented with shortness of breath and chest tightness for 2 to 3 weeks, found to be in atrial flutter RVR and volume overload -Started on IV amiodarone  load, ventricular rate is now controlled, continue amiodarone  drip; blood pressure chronically low requiring midodrine  support, and able to tolerate any AV nodal blocking agent -Heparin  gtt was given on 4/21st and stopped subsequently due to known subdural hematoma 11/2022 and recurrent falls at home, would consider repeat CT scan of head to reevaluate SDH and she reports not falling frequently at home, risk of bleeding should be reassessed to determine if anticoagulation can be used going forward  Acute on chronic systolic and diastolic heart failure Valvular heart disease - LVEF 30 to 35% dating back to 2021 with moderate to severe MR and moderate MS, refused TEE for workup at the time at the time - Most recent echo from 11/2023 with  LVEF 30 to 35%, moderately reduced RV, severe LAE, moderate RAE, moderate MR, mild MS, moderate MAC, moderate to severe TR, moderate to severe AI, IVC dilated - Etiology unclear, possible due to history of hypertension, cocaine  abuse, or lupus; no history echo ischemic evaluation, not a ideal candidate for invasive workup - Continue dialysis for volume management - GDMT: Limited by chronic hypotension and advanced renal disease - Poor candidate for surgical repair of underlying valvular disease  Elevated troponin - high sensitive troponin flat at 200s - She had no chest pain - Likely demand ischemia in the  setting of ESRD/CHF/atrial flutter with RVR - Not a great candidate for invasive evaluation due to chronic medical issues and noncompliance - will check A1C and Lipid panel for risk stratification and optimizing medical management  ESRD on dialysis Lupus Acute hypoxic respiratory failure Hypotension Chronic right leg wound Hyponatremia -Per primary team   For questions or updates, please contact [facility] Please consult [email or web address] for contact info under      Signed, Xika Zhao, NP   Patient seen and examined, note reviewed with the signed Advanced Practice Provider. I personally reviewed laboratory data, imaging studies and relevant notes. I independently examined the patient and formulated the important aspects of the plan. I have personally discussed the plan with the patient and/or family. Comments or changes to the note/plan are indicated below.  Heart failure is being addressed by hemodialysis. As noted earlier troponin is flat no need for any further ischemic evaluation at this time. In terms of her atrial fibrillation her rate is controlled if heart rate continue plan to transition to oral amiodarone  tomorrow morning.   Jaquilla Woodroof DO, MS [facility] Attending Cardiologist [facility]  [address] [place], [place] [address] [phone] Website: [email or web address]

## 2024-04-19 NOTE — Progress Notes (Signed)
 [facility] Progress Note   Subjective:   Seen on HD - 2L UFG and tolerating. BP low on midodrine . Will give IV albumin . C/o painful spot on L thigh - she is worried she is getting a wound there - nothing on exam at this time. Per notes, with recurrent hypoglycemic issues requiring D10.  Objective Vitals:   04/19/24 0315 04/19/24 0748 04/19/24 0807 04/19/24 0830  BP: (!) 101/56 (!) 91/50 (!) 87/55 (!) 95/49  Pulse:  96 (!) 36 98  Resp: 16 (!) 21 18 18   Temp: 98.8 F (37.1 C) 97.8 F (36.6 C)    TempSrc: Oral     SpO2: 95% 91% (!) 69%   Weight:  43.9 kg    Height:       Physical Exam General: Frail, NAD. [place] O2 in place Heart: RRR; no murmur Lungs: Bibasilar rales Abdomen: soft Extremities: no LE edema, R thigh bandaged, L thigh without wound Dialysis Access: RUE AVF +t/b  Additional Objective Labs: Basic Metabolic Panel: Recent Labs  Lab 04/17/24 0555  NA 137  K 5.2*  CL 87*  CO2 18*  GLUCOSE 63*  BUN 50*  CREATININE 6.49*  CALCIUM  10.1   Liver Function Tests: Recent Labs  Lab 04/17/24 0758  AST 39  ALT 23  ALKPHOS 352*  BILITOT 1.3*  PROT 8.1  ALBUMIN  2.6*   CBC: Recent Labs  Lab 04/17/24 0555 04/18/24 0319 04/19/24 0248  WBC 5.2 4.9 4.9  NEUTROABS 2.3  --   --   HGB 12.7 13.1 12.2  HCT 38.5 38.9 37.4  MCV 84.2 83.1 84.2  PLT 175 169 155   Studies/Results: CT ABDOMEN PELVIS W CONTRAST Result Date: 04/17/2024 CLINICAL DATA:  Abdominal pain, acute, nonlocalized EXAM: CT ABDOMEN AND PELVIS WITH CONTRAST TECHNIQUE: Multidetector CT imaging of the abdomen and pelvis was performed using the standard protocol following bolus administration of intravenous contrast. RADIATION DOSE REDUCTION: This exam was performed according to the departmental dose-optimization program which includes automated exposure control, adjustment of the mA and/or kV according to patient size and/or use of iterative reconstruction technique. CONTRAST:  75mL  OMNIPAQUE  IOHEXOL  350 MG/ML SOLN COMPARISON:  December 15, 2023 FINDINGS: Lower chest: For findings above the diaphragm, please see the separately dictated CT of the chest report, which was performed concurrently. Hepatobiliary: No mass. Reflux of contrast into the hepatic veins. Heterogeneous enhancement of the left hepatic lobe and inferior right hepatic tip.No radiopaque stones or wall thickening of the gallbladder.No intrahepatic or extrahepatic biliary ductal dilation. Pancreas: No mass or main ductal dilation.No peripancreatic inflammation or fluid collection. Spleen: No mass. Heterogeneous enhancement throughout the subcapsular spleen, likely due to the phase of contrast. Adrenals/Urinary Tract: No adrenal masses. Severe renal atrophy bilaterally. No mass. No nephrolithiasis or hydronephrosis. Decompressed urinary bladder without visualized focal abnormality. Stomach/Bowel: The stomach is decompressed without focal abnormality. No small bowel wall thickening or inflammation. No small bowel obstruction. Normal appendix. Vascular/Lymphatic: No aortic aneurysm. No intraabdominal or pelvic lymphadenopathy. Reproductive: The uterus and ovaries are within normal limits for patient's age.No free pelvic fluid. Other: No pneumoperitoneum. Small volume ascites with diffuse mesenteric edema. Musculoskeletal: No acute fracture or destructive lesion.Renal osteodystrophy changes. 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 IMPRESSION: 1. No acute intra-abdominal or pelvic abnormality. 2. Small volume ascites with diffuse mesenteric edema, likely related to the patient's volume status. 3. Reflux of contrast into the hepatic veins, consistent with underlying cardiac dysfunction. Heterogeneous enhancement of the left hepatic lobe and inferior right  hepatic tip is also likely related to the cardiac dysfunction (congestive hepatopathy). Correlation with liver enzymes recommended to exclude acute hepatitis.  Electronically Signed   By: Rance Burrows M.D.   On: 04/17/2024 12:04   CT Angio Chest PE W and/or Wo Contrast Result Date: 04/17/2024 CLINICAL DATA:  Pulmonary embolism (PE) suspected, high prob EXAM: CT ANGIOGRAPHY CHEST WITH CONTRAST TECHNIQUE: Multidetector CT imaging of the chest was performed using the standard protocol during bolus administration of intravenous contrast. Multiplanar CT image reconstructions and MIPs were obtained to evaluate the vascular anatomy. RADIATION DOSE REDUCTION: This exam was performed according to the departmental dose-optimization program which includes automated exposure control, adjustment of the mA and/or kV according to patient size and/or use of iterative reconstruction technique. CONTRAST:  75mL OMNIPAQUE  IOHEXOL  350 MG/ML SOLN COMPARISON:  January 08, 2024 FINDINGS: Pulmonary Embolism: The segmental and subsegmental branches of the left lower lobe are not well opacified by the contrast bolus. Otherwise, no visualized pulmonary embolus in the remaining pulmonary arteries. Cardiovascular: Severe cardiomegaly. No aortic aneurysm. Diffuse aortic atherosclerosis. Extensive multi-vessel coronary atherosclerosis. Mediastinum/Nodes: No mediastinal mass. Enlarged multi station mediastinal and bilateral hilar lymph nodes, unchanged, likely reactive. Lungs/Pleura: The trachea is midline and patent. Mild diffuse bronchial wall thickening with interlobular septal thickening throughout the lungs. No lobar airspace consolidation or pneumothorax. Small bilateral pleural effusions with hazy ground-glass attenuation throughout the lungs. 4 mm right lower lobe nodule (axial 88), unchanged. Musculoskeletal: No acute fracture or destructive bone lesion. Renal osteodystrophy changes. Multilevel thoracic osteophytosis. Upper Abdomen: For findings below the diaphragm, please see the separately dictated CT of the abdomen and pelvis report, which was performed concurrently. Review of the MIP  images confirms the above findings. IMPRESSION: 1. The segmental and subsegmental branches of the left lower lobe are not well opacified by the contrast bolus. Otherwise, no visualized pulmonary embolus in the remaining pulmonary arteries. 2. Severe cardiomegaly, unchanged, with superimposed findings of pulmonary edema. Small bilateral pleural effusions. 3. Right lower lobe pulmonary nodule measuring 4 mm, unchanged. Follow-up could be considered, as documented below. 4. For findings below the diaphragm, please see the separately dictated CT of the abdomen and pelvis report, which was performed concurrently. No follow-up needed if patient is low-risk.This recommendation follows the consensus statement: Guidelines for Management of Incidental Pulmonary Nodules Detected on CT Images: From the Fleischner Society 2017; Radiology 2017; 284:228-243. Aortic Atherosclerosis (ICD10-I70.0). Electronically Signed   By: Rance Burrows M.D.   On: 04/17/2024 11:49   Medications:  amiodarone  30 mg/hr (04/18/24 2349)   dextrose  50 mL/hr at 04/18/24 2357    cinacalcet   30 mg Oral QPM   feeding supplement (NEPRO CARB STEADY)  237 mL Oral TID BM   melatonin  3 mg Oral QHS   midodrine   10 mg Oral TID WC   multivitamin  1 tablet Oral QHS   sevelamer  carbonate  2,400 mg Oral TID with meals   sodium chloride  flush  3 mL Intravenous Q12H    Dialysis Orders MWF - [facility]:45hr, 400/A1.5, EDW 39.5kg, 2K/2Ca bath, RUE AVF, no heparin  - no ESA, Hgb > 12 - Hectoral IV q HD  Assessment/Plan: # Acute on Chronic Resp Failure/pulm edema: Improved with HD, but still present. #ESRD:  Usual MWF schedule - HD now, 2L UFG. No heparin . #Hypotension/volume: On midodrine , UF as tolerated. BP drops with HD. #Anemia: Hgb > 12, no ESA needed #Metabolic bone disease: Ca high, VDRA on hold for now. Continue home binders + cinacalcet . #  Nutrition:  Alb low, rec high protein supplements #SLE #R thigh  wound #Hypoglycemia #A-fib/flutter   [name] Mercury, PA-C 04/19/2024, 8:48 AM  [facility]

## 2024-04-19 NOTE — Progress Notes (Signed)
 Pharmacist Heart Failure Core Measure Documentation  Assessment: [name] has an EF documented as 35-40% on 12/14/23 by ECHO.  Rationale: Heart failure patients with left ventricular systolic dysfunction (LVSD) and an EF < 40% should be prescribed an angiotensin converting enzyme inhibitor (ACEI) or angiotensin receptor blocker (ARB) at discharge unless a contraindication is documented in the medical record.  This patient is not currently on an ACEI or ARB for HF.  This note is being placed in the record in order to provide documentation that a contraindication to the use of these agents is present for this encounter.  ACE Inhibitor or Angiotensin Receptor Blocker is contraindicated (specify all that apply)  []   ACEI allergy AND ARB allergy []   Angioedema []   Moderate or severe aortic stenosis []   Hyperkalemia []   Hypotension []   Renal artery stenosis [x]   Worsening renal function, preexisting renal disease or dysfunction   Cheryll Corti, BCCP Clinical Pharmacist  04/19/2024 3:09 PM   [facility] pharmacy phone numbers are listed on [email or web address]

## 2024-04-20 DIAGNOSIS — R0602 Shortness of breath: Secondary | ICD-10-CM | POA: Diagnosis not present

## 2024-04-20 DIAGNOSIS — I4892 Unspecified atrial flutter: Secondary | ICD-10-CM | POA: Diagnosis not present

## 2024-04-20 LAB — RENAL FUNCTION PANEL
Albumin: 2.2 g/dL — ABNORMAL LOW (ref 3.5–5.0)
Anion gap: 15 (ref 5–15)
BUN: 34 mg/dL — ABNORMAL HIGH (ref 6–20)
CO2: 24 mmol/L (ref 22–32)
Calcium: 8.2 mg/dL — ABNORMAL LOW (ref 8.9–10.3)
Chloride: 91 mmol/L — ABNORMAL LOW (ref 98–111)
Creatinine, Ser: 4.4 mg/dL — ABNORMAL HIGH (ref 0.44–1.00)
GFR, Estimated: 12 mL/min — ABNORMAL LOW (ref >60)
Glucose, Bld: 94 mg/dL (ref 70–99)
Phosphorus: 3.1 mg/dL (ref 2.5–4.6)
Potassium: 3.9 mmol/L (ref 3.5–5.1)
Sodium: 130 mmol/L — ABNORMAL LOW (ref 135–145)

## 2024-04-20 LAB — GLUCOSE, CAPILLARY
Glucose-Capillary: 102 mg/dL — ABNORMAL HIGH (ref 70–99)
Glucose-Capillary: 120 mg/dL — ABNORMAL HIGH (ref 70–99)
Glucose-Capillary: 54 mg/dL — ABNORMAL LOW (ref 70–99)
Glucose-Capillary: 65 mg/dL — ABNORMAL LOW (ref 70–99)
Glucose-Capillary: 78 mg/dL (ref 70–99)
Glucose-Capillary: 89 mg/dL (ref 70–99)
Glucose-Capillary: 99 mg/dL (ref 70–99)

## 2024-04-20 LAB — CBC
HCT: 35.9 % — ABNORMAL LOW (ref 36.0–46.0)
Hemoglobin: 11.6 g/dL — ABNORMAL LOW (ref 12.0–15.0)
MCH: 27.2 pg (ref 26.0–34.0)
MCHC: 32.3 g/dL (ref 30.0–36.0)
MCV: 84.3 fL (ref 80.0–100.0)
Platelets: 151 10*3/uL (ref 150–400)
RBC: 4.26 MIL/uL (ref 3.87–5.11)
RDW: 15.4 % (ref 11.5–15.5)
WBC: 6.3 10*3/uL (ref 4.0–10.5)
nRBC: 0 % (ref 0.0–0.2)

## 2024-04-20 MED ORDER — AMIODARONE HCL 200 MG PO TABS: 200.0000 mg | ORAL_TABLET | Freq: Every day | ORAL | Status: DC

## 2024-04-20 MED ORDER — AMIODARONE HCL 200 MG PO TABS
200.0000 mg | ORAL_TABLET | Freq: Two times a day (BID) | ORAL | Status: DC
  Administered 2024-04-20 – 2024-04-21 (×3): 200 mg via ORAL
  Filled 2024-04-20 (×3): qty 1

## 2024-04-20 MED ORDER — PENTAFLUOROPROP-TETRAFLUOROETH EX AERO: 1.0000 | INHALATION_SPRAY | CUTANEOUS | Status: DC | PRN

## 2024-04-20 MED ORDER — ANTICOAGULANT SODIUM CITRATE 4% (200MG/5ML) IV SOLN: 5.0000 mL | Status: DC | PRN

## 2024-04-20 MED ORDER — LIDOCAINE-PRILOCAINE 2.5-2.5 % EX CREA: 1.0000 | TOPICAL_CREAM | CUTANEOUS | Status: DC | PRN

## 2024-04-20 MED ORDER — HEPARIN SODIUM (PORCINE) 1000 UNIT/ML DIALYSIS: 1000.0000 [IU] | INTRAMUSCULAR | Status: DC | PRN

## 2024-04-20 MED ORDER — CHLORHEXIDINE GLUCONATE CLOTH 2 % EX PADS
6.0000 | MEDICATED_PAD | Freq: Every day | CUTANEOUS | Status: DC
  Administered 2024-04-20 – 2024-04-21 (×2): 6 via TOPICAL

## 2024-04-20 NOTE — Progress Notes (Signed)
 Progress Note  Patient Name: [name] Date of Encounter: 04/20/2024  Primary Cardiologist: Sheryle Donning, MD   Subjective   Patient seen and examined at her bedside. She reports vomiting this morning. In sinus rhythm today  Inpatient Medications    Scheduled Meds:  cinacalcet   30 mg Oral QPM   feeding supplement (NEPRO CARB STEADY)  237 mL Oral TID BM   melatonin  3 mg Oral QHS   midodrine   10 mg Oral TID WC   multivitamin  1 tablet Oral QHS   sevelamer  carbonate  2,400 mg Oral TID with meals   sodium chloride  flush  3 mL Intravenous Q12H   Continuous Infusions:  amiodarone  30 mg/hr (04/20/24 0005)   PRN Meds: acetaminophen  **OR** acetaminophen , dextrose , diphenhydrAMINE , hydrALAZINE , ondansetron  **OR** ondansetron  (ZOFRAN ) IV, oxyCODONE    Vital Signs    Vitals:   04/19/24 2004 04/20/24 0335 04/20/24 0424 04/20/24 0725  BP: (!) 102/50 (!) 89/48  121/61  Pulse: 94   97  Resp: 18 20  20   Temp: 98.5 F (36.9 C) 98.2 F (36.8 C)  98.2 F (36.8 C)  TempSrc: Oral Oral  Oral  SpO2: 98%     Weight:   41.5 kg   Height:        Intake/Output Summary (Last 24 hours) at 04/20/2024 0808 Last data filed at 04/20/2024 0200 Gross per 24 hour  Intake 200 ml  Output 68.3 ml  Net 131.7 ml   Filed Weights   04/18/24 0500 04/19/24 0748 04/20/24 0424  Weight: 40.1 kg 43.9 kg 41.5 kg    Telemetry    Sinus rhythm  - Personally Reviewed  ECG     - Personally Reviewed  Physical Exam    General: Comfortable Head: Atraumatic, normal size  Eyes: PEERLA, EOMI  Neck: Supple, normal JVD Cardiac: Normal S1, S2; RRR; no murmurs, rubs, or gallops Lungs: Clear to auscultation bilaterally Abd: Soft, nontender, no hepatomegaly  Ext: warm, no edema Musculoskeletal: No deformities, BUE and BLE strength normal and equal Skin: Warm and dry, no rashes   Neuro: Alert and oriented to person, place, time, and situation, CNII-XII grossly intact, no focal deficits  Psych:  Normal mood and affect   Labs    Chemistry Recent Labs  Lab 04/17/24 0555 04/17/24 0758 04/19/24 0807 04/19/24 1344  NA 137  --  129*  --   K 5.2*  --  3.6  --   CL 87*  --  85*  --   CO2 18*  --  24  --   GLUCOSE 63*  --  122* 85  BUN 50*  --  52*  --   CREATININE 6.49*  --  5.77*  --   CALCIUM  10.1  --  9.4  --   PROT  --  8.1  --   --   ALBUMIN   --  2.6* 2.1*  --   AST  --  39  --   --   ALT  --  23  --   --   ALKPHOS  --  352*  --   --   BILITOT  --  1.3*  --   --   GFRNONAA 8*  --  9*  --   ANIONGAP 32*  --  20*  --      Hematology Recent Labs  Lab 04/18/24 0319 04/19/24 0248 04/20/24 0331  WBC 4.9 4.9 6.3  RBC 4.68 4.44 4.26  HGB 13.1 12.2 11.6*  HCT 38.9  37.4 35.9*  MCV 83.1 84.2 84.3  MCH 28.0 27.5 27.2  MCHC 33.7 32.6 32.3  RDW 14.9 15.1 15.4  PLT 169 155 151    Cardiac EnzymesNo results for input(s): "TROPONINI" in the last 168 hours. No results for input(s): "TROPIPOC" in the last 168 hours.   BNP Recent Labs  Lab 04/17/24 0555  BNP >4,500.0*     DDimer  Recent Labs  Lab 04/17/24 0612  DDIMER 1.95*     Radiology    No results found.  Cardiac Studies  echo   Patient Profile     43 y.o. female with a history of ESRD on HD, polysubstance abuse, atrial fib/flutter, SLE, hypotension on midodrine , HFrEF, mitral stenosis, mitral regurgitation, aortic regurgitation.   Assessment & Plan    Atrial flutter with RVR  Acute on Chronic Systolic Heart Failure Elevated troponin  Moderate mitral valve regurgitation  Moderate -severe tricuspid valve regurgitation Moderate-severe aortic valve regurgitation   She is now in sinus rhythm in the 60s. Will stop amio gtt and transition to oral: Amiodarone  200 mg BID 2 days then once daily.   Clinically euvolemic - had her HD.   Elevated troponin this is in the setting of her demand ischemia.  She does not complain of any anginal symptoms no plan for any further diagnostic workup at this time.    She has had a great deal of discussion in the past for anticoagulation and shared decision had been made in the past to take her off anticoagulation due to subdural hematoma and recurrent/recent falls.   There also been a great deal of discussion for her valvular heart disease and the patient has been deemed not a candidate for valvular intervention.   She is on midodrine  chronically we will continue this  For questions or updates, please contact [facility] Please consult [email or web address] for contact info under Cardiology/STEMI.      Signed, Arienne Gartin, DO  04/20/2024, 8:08 AM

## 2024-04-20 NOTE — Plan of Care (Signed)
  Problem: Education: Goal: Ability to demonstrate management of disease process will improve Outcome: Progressing   Problem: Activity: Goal: Capacity to carry out activities will improve Outcome: Progressing   Problem: Activity: Goal: Risk for activity intolerance will decrease Outcome: Progressing   Problem: Nutrition: Goal: Adequate nutrition will be maintained Outcome: Progressing

## 2024-04-20 NOTE — TOC Progression Note (Signed)
 Transition of Care [facility]) - Progression Note    Patient Details  Name: TINEY ZIPPER MRN: [id number] Date of Birth: 11-16-1981  Transition of Care [facility]) CM/SW Contact  Jeani Mill, RN Phone Number: 04/20/2024, 2:22 PM  Clinical Narrative:    Patient is agreeable to OP rehab. Referral sent to [address]  Awaiting DME recommendations from PT. Address, Phone number and PCP verified.    Expected Discharge Plan: OP Rehab Barriers to Discharge: Continued Medical Work up  Expected Discharge Plan and Services       Living arrangements for the past 2 months: Apartment                                       Social Determinants of Health (SDOH) Interventions SDOH Screenings   Food Insecurity: Patient Declined (04/17/2024)  Housing: Patient Declined (04/17/2024)  Transportation Needs: Patient Declined (04/17/2024)  Utilities: Patient Declined (04/17/2024)  Depression (PHQ2-9): Low Risk  (04/29/2022)  Tobacco Use: High Risk (04/17/2024)    Readmission Risk Interventions    04/17/2022    8:36 AM  Readmission Risk Prevention Plan  Transportation Screening Complete  PCP or Specialist Appt within 3-5 Days Complete  HRI or Home Care Consult Complete  Social Work Consult for Recovery Care Planning/Counseling Complete  Palliative Care Screening Not Applicable  Medication Review [employer]) Complete

## 2024-04-20 NOTE — Progress Notes (Signed)
 PT Cancellation Note  Patient Details Name: [name] MRN: [id number] DOB: 1981-06-04   Cancelled Treatment:    Reason Eval/Treat Not Completed: Other (comment)  Patient refusing due to visitor just arrived and brought her food that she wants to eat before she goes to dialysis. Explained it would be ideal to participate prior to going to dialysis, however she continued to refuse services at this time. Will reattempt as schedules permit.    Gayle Kava, PT Acute Rehabilitation Services  Office [phone]   [place] Leep 04/20/2024, 11:25 AM

## 2024-04-20 NOTE — Progress Notes (Signed)
 Arrived in patient's room. Alert and oriented x 4,she was sleepy.Consent verified.  Access used : Right upper arm AVF that worked well.  Duration of treatment : 3 hours.  Uf goal : Met 2 liters. She tolerated her treatment:  Hemo comment: Patient was cursing this writer,for she wants to go to the bathroom during last minute of her treatment. I said to her ,I need to disconnect and removed the needles first and beside I could not allow you to go alone due to being sleepy.Post disconnection called the patient's nurse to assist patient.  Hand off to the patient's nurse with stable medical condition.

## 2024-04-20 NOTE — Progress Notes (Signed)
 PT Cancellation Note  Patient Details Name: [name] MRN: [id number] DOB: Feb 11, 1981   Cancelled Treatment:    Reason Eval/Treat Not Completed: Patient at procedure or test/unavailable  RN reports dialysis called and is on their way for patient. Will continue to attempt x 1 more day and if pt refuses, that will be 3 refusals and she will be discharged from PT.   Gayle Kava, PT Acute Rehabilitation Services  Office [phone]  [place] Leep 04/20/2024, 3:06 PM

## 2024-04-20 NOTE — Plan of Care (Signed)

## 2024-04-20 NOTE — Progress Notes (Signed)
 PROGRESS NOTE    [name]  ZOX:[id number] DOB: April 19, 1981 DOA: 04/17/2024 PCP: Senaida Dama, NP    Brief Narrative:  43 y.o. female with past medical history  of atrial flutter and  A-fib, HFrEF with a EF of 30 to 35% on most recent echo, end-stage renal disease on hemodialysis Tuesday Thursday and Saturday schedule, valvular heart disease with mitral stenosis and regurg, aortic insufficiency, admitted with shortness of breath.  On initial presentation patient was tachycardic and dyspneic and started on 3 L nasal cannula.  Remained in the hospital due to a flutter and shortness of breath.  Subjective:  Patient seen and examined.  She still has some shortness of breath but denies any other complaints.  Sinus rhythm in tele monitor.   Assessment & Plan:   Acute hypoxemic resp failure , volume overload, heart failure with reduced ejection fraction and cardiomyopathy with known EF 30%: Shortness of breath is multifactorial.  Volume overload secondary to known cardiomyopathy and inadequate volume removal on dialysis. HD 4/21, 4/22, 4/23 with some clinical improvement.  Another HD today.  Start mobilizing.  Keep on oxygen to keep saturation more than 90%.  A flutter with RVR: Treated with amiodarone  drip.  Not on anticoagulation due to recurrent falls.  History of subdural hematoma.  Now sinus rhythm.  Cardiology to convert to oral amiodarone .   ESRD on hemodialysis: As above.  Volume managed.  Patient on additional midodrine  support.  Hypoglycemia: false.  Patient without any symptoms with capillary blood sugars less than 50. No recurrent issues now.    DVT prophylaxis: SCDs   Code Status: Full code Family Communication: None at bedside Disposition Plan: Status is: Inpatient Remains inpatient appropriate because: Volume removal, dialysis     Consultants:  Cardiology Nephrology  Procedures:  Hemodialysis  Antimicrobials:  None     Objective: Vitals:    04/19/24 2004 04/20/24 0335 04/20/24 0424 04/20/24 0725  BP: (!) 102/50 (!) 89/48  121/61  Pulse: 94   97  Resp: 18 20  20   Temp: 98.5 F (36.9 C) 98.2 F (36.8 C)  98.2 F (36.8 C)  TempSrc: Oral Oral  Oral  SpO2: 98%     Weight:   41.5 kg   Height:        Intake/Output Summary (Last 24 hours) at 04/20/2024 1107 Last data filed at 04/20/2024 0200 Gross per 24 hour  Intake 200 ml  Output 68.3 ml  Net 131.7 ml   Filed Weights   04/18/24 0500 04/19/24 0748 04/20/24 0424  Weight: 40.1 kg 43.9 kg 41.5 kg    Examination:  General exam: Appears calm and comfortable .  Pleasant interactive. Chronically sick looking.  Frail and debilitated. Respiratory system: Clear to auscultation. Respiratory effort normal.  Mostly on room air. Cardiovascular system: S1 & S2 heard, RRR.  Gastrointestinal system: Soft and nontender.  Bowel sound present. Central nervous system: Alert and oriented. No focal neurological deficits. Flat affect.  Generalized weakness. 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    Data Reviewed: I have personally reviewed following labs and imaging studies  CBC: Recent Labs  Lab 04/17/24 0555 04/18/24 0319 04/19/24 0248 04/20/24 0331  WBC 5.2 4.9 4.9 6.3  NEUTROABS 2.3  --   --   --   HGB 12.7 13.1 12.2 11.6*  HCT 38.5 38.9 37.4 35.9*  MCV 84.2 83.1 84.2 84.3  PLT 175 169 155 151   Basic Metabolic Panel: Recent Labs  Lab 04/17/24 0555  04/19/24 0807 04/19/24 1344  NA 137 129*  --   K 5.2* 3.6  --   CL 87* 85*  --   CO2 18* 24  --   GLUCOSE 63* 122* 85  BUN 50* 52*  --   CREATININE 6.49* 5.77*  --   CALCIUM  10.1 9.4  --   PHOS  --  5.0*  --    GFR: Estimated Creatinine Clearance: 8.5 mL/min (A) (by C-G formula based on SCr of 5.77 mg/dL (H)). Liver Function Tests: Recent Labs  Lab 04/17/24 0758 04/19/24 0807  AST 39  --   ALT 23  --   ALKPHOS 352*  --   BILITOT 1.3*  --   PROT 8.1  --   ALBUMIN  2.6* 2.1*   No results  for input(s): "LIPASE", "AMYLASE" in the last 168 hours. No results for input(s): "AMMONIA" in the last 168 hours. Coagulation Profile: No results for input(s): "INR", "PROTIME" in the last 168 hours. Cardiac Enzymes: No results for input(s): "CKTOTAL", "CKMB", "CKMBINDEX", "TROPONINI" in the last 168 hours. BNP (last 3 results) No results for input(s): "PROBNP" in the last 8760 hours. HbA1C: Recent Labs    04/19/24 1344  HGBA1C 6.1*   CBG: Recent Labs  Lab 04/19/24 1645 04/19/24 1953 04/20/24 0010 04/20/24 0330 04/20/24 0859  GLUCAP 86 116* 89 120* 78   Lipid Profile: Recent Labs    04/19/24 1344  CHOL 58  HDL 14*  LDLCALC 22  TRIG 767  CHOLHDL 4.1   Thyroid Function Tests: No results for input(s): "TSH", "T4TOTAL", "FREET4", "T3FREE", "THYROIDAB" in the last 72 hours. Anemia Panel: No results for input(s): "VITAMINB12", "FOLATE", "FERRITIN", "TIBC", "IRON ", "RETICCTPCT" in the last 72 hours. Sepsis Labs: Recent Labs  Lab 04/18/24 0319  LATICACIDVEN 2.7*    Recent Results (from the past 240 hours)  Resp panel by RT-PCR (RSV, Flu A&B, Covid) Anterior Nasal Swab     Status: None   Collection Time: 04/17/24  5:38 AM   Specimen: Anterior Nasal Swab  Result Value Ref Range Status   SARS Coronavirus 2 by RT PCR NEGATIVE NEGATIVE Final   Influenza A by PCR NEGATIVE NEGATIVE Final   Influenza B by PCR NEGATIVE NEGATIVE Final    Comment: (NOTE) The Xpert Xpress SARS-CoV-2/FLU/RSV plus assay is intended as an aid in the diagnosis of influenza from Nasopharyngeal swab specimens and should not be used as a sole basis for treatment. Nasal washings and aspirates are unacceptable for Xpert Xpress SARS-CoV-2/FLU/RSV testing.  Fact Sheet for Patients: BloggerCourse.com  Fact Sheet for Healthcare Providers: SeriousBroker.it  This test is not yet approved or cleared by the [place]  FDA and has been authorized for  detection and/or diagnosis of SARS-CoV-2 by FDA under an Emergency Use Authorization (EUA). This EUA will remain in effect (meaning this test can be used) for the duration of the COVID-19 declaration under Section 564(b)(1) of the Act, 21 U.S.C. section 360bbb-3(b)(1), unless the authorization is terminated or revoked.     Resp Syncytial Virus by PCR NEGATIVE NEGATIVE Final    Comment: (NOTE) Fact Sheet for Patients: BloggerCourse.com  Fact Sheet for Healthcare Providers: SeriousBroker.it  This test is not yet approved or cleared by the [place]  FDA and has been authorized for detection and/or diagnosis of SARS-CoV-2 by FDA under an Emergency Use Authorization (EUA). This EUA will remain in effect (meaning this test can be used) for the duration of the COVID-19 declaration under Section 564(b)(1) of the Act, 21 U.S.C. section  360bbb-3(b)(1), unless the authorization is terminated or revoked.  Performed at [facility], [address], [place], [place] [address]   MRSA Next Gen by PCR, Nasal     Status: Abnormal   Collection Time: 04/19/24  4:47 PM   Specimen: Nasal Mucosa; Nasal Swab  Result Value Ref Range Status   MRSA by PCR Next Gen DETECTED (A) NOT DETECTED Final    Comment: RESULT CALLED TO, READ BACK BY AND VERIFIED WITH: RN NICOLE ATKINS ON 04/19/24 @ 2019 BY DRT (NOTE) The GeneXpert MRSA Assay (FDA approved for NASAL specimens only), is one component of a comprehensive MRSA colonization surveillance program. It is not intended to diagnose MRSA infection nor to guide or monitor treatment for MRSA infections. Test performance is not FDA approved in patients less than 5 years old. Performed at [facility], [address] [place], [place] [address]          Radiology Studies: No results found.       Scheduled Meds:  amiodarone   200 mg Oral BID   [START ON 04/22/2024] amiodarone   200 mg  Oral Daily   Chlorhexidine  Gluconate Cloth  6 each Topical Q0600   cinacalcet   30 mg Oral QPM   feeding supplement (NEPRO CARB STEADY)  237 mL Oral TID BM   melatonin  3 mg Oral QHS   midodrine   10 mg Oral TID WC   multivitamin  1 tablet Oral QHS   sevelamer  carbonate  2,400 mg Oral TID with meals   sodium chloride  flush  3 mL Intravenous Q12H   Continuous Infusions:     LOS: 3 days       Vada Garibaldi, MD [facility]

## 2024-04-20 NOTE — Progress Notes (Signed)
 [name] PROGRESS NOTE  Assessment/ Plan: Pt is a 43 y.o. 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  Dialysis Orders:  TTS - [facility] 3:45hr, 400/A1.5, EDW 39.5kg, 2K/2Ca bath, RUE AVF, no heparin  - no ESA, Hgb > 12 - Hectoral 2mcg IV q HD  # Acute on Chronic Resp Failure/pulm edema: She had dialysis yesterday.  Reported some shortness of breath this morning therefore we will plan to do dialysis to put her back on TTS schedule as well.    #ESRD:  Usual TTS schedule - HD today. No heparin . #Hypotension/volume: On midodrine , UF as tolerated #Anemia: Hgb > 12, no ESA needed #Metabolic bone disease: Ca high, hold VDRA for now. Continue home binders. #Nutrition:  Alb low, rec high protein supplements #SLE  Subjective: Seen and examined.  She reported shortness of breath and telling me that she is actually TTS schedule.  I looked at her outpatient record as well.  No chest pain, nausea or vomiting.  Feels tired. Objective Vital signs in last 24 hours: Vitals:   04/19/24 2004 04/20/24 0335 04/20/24 0424 04/20/24 0725  BP: (!) 102/50 (!) 89/48  121/61  Pulse: 94   97  Resp: 18 20  20   Temp: 98.5 F (36.9 C) 98.2 F (36.8 C)  98.2 F (36.8 C)  TempSrc: Oral Oral  Oral  SpO2: 98%     Weight:   41.5 kg   Height:       Weight change:   Intake/Output Summary (Last 24 hours) at 04/20/2024 0938 Last data filed at 04/20/2024 0200 Gross per 24 hour  Intake 200 ml  Output 68.3 ml  Net 131.7 ml       Labs: RENAL PANEL Recent Labs  Lab 04/17/24 0555 04/17/24 0758 04/19/24 0807 04/19/24 1344  NA 137  --  129*  --   K 5.2*  --  3.6  --   CL 87*  --  85*  --   CO2 18*  --  24  --   GLUCOSE 63*  --  122* 85  BUN 50*  --  52*  --   CREATININE 6.49*  --  5.77*  --   CALCIUM  10.1  --  9.4  --   PHOS  --   --  5.0*  --   ALBUMIN   --  2.6* 2.1*  --     Liver Function Tests: Recent Labs  Lab  04/17/24 0758 04/19/24 0807  AST 39  --   ALT 23  --   ALKPHOS 352*  --   BILITOT 1.3*  --   PROT 8.1  --   ALBUMIN  2.6* 2.1*   No results for input(s): "LIPASE", "AMYLASE" in the last 168 hours. No results for input(s): "AMMONIA" in the last 168 hours. CBC: Recent Labs    01/29/24 1314 04/17/24 0555 04/18/24 0319 04/19/24 0248 04/20/24 0331  HGB 9.2* 12.7 13.1 12.2 11.6*  MCV 95.5 84.2 83.1 84.2 84.3    Cardiac Enzymes: No results for input(s): "CKTOTAL", "CKMB", "CKMBINDEX", "TROPONINI" in the last 168 hours. CBG: Recent Labs  Lab 04/19/24 1645 04/19/24 1953 04/20/24 0010 04/20/24 0330 04/20/24 0859  GLUCAP 86 116* 89 120* 78    Iron  Studies: No results for input(s): "IRON ", "TIBC", "TRANSFERRIN", "FERRITIN" in the last 72 hours. Studies/Results: No results found.   Medications: Infusions:    Scheduled Medications:  amiodarone   200 mg Oral BID   [START  ON 04/22/2024] amiodarone   200 mg Oral Daily   cinacalcet   30 mg Oral QPM   feeding supplement (NEPRO CARB STEADY)  237 mL Oral TID BM   melatonin  3 mg Oral QHS   midodrine   10 mg Oral TID WC   multivitamin  1 tablet Oral QHS   sevelamer  carbonate  2,400 mg Oral TID with meals   sodium chloride  flush  3 mL Intravenous Q12H    have reviewed scheduled and prn medications.  Physical Exam: General:NAD, comfortable Heart:RRR, s1s2 nl Lungs:clear b/l, no crackle Abdomen:soft, Non-tender, non-distended Extremities:No edema Dialysis Access: AV fistula.  [name] Prasad [name] 04/20/2024,9:38 AM  LOS: 3 days

## 2024-04-21 ENCOUNTER — Telehealth: Payer: Self-pay | Admitting: Nurse Practitioner

## 2024-04-21 DIAGNOSIS — Z992 Dependence on renal dialysis: Secondary | ICD-10-CM

## 2024-04-21 DIAGNOSIS — I4892 Unspecified atrial flutter: Secondary | ICD-10-CM | POA: Diagnosis not present

## 2024-04-21 DIAGNOSIS — N186 End stage renal disease: Secondary | ICD-10-CM | POA: Diagnosis not present

## 2024-04-21 DIAGNOSIS — J9601 Acute respiratory failure with hypoxia: Secondary | ICD-10-CM | POA: Diagnosis not present

## 2024-04-21 LAB — CBC
HCT: 38.8 % (ref 36.0–46.0)
Hemoglobin: 12.7 g/dL (ref 12.0–15.0)
MCH: 27.4 pg (ref 26.0–34.0)
MCHC: 32.7 g/dL (ref 30.0–36.0)
MCV: 83.8 fL (ref 80.0–100.0)
Platelets: 154 10*3/uL (ref 150–400)
RBC: 4.63 MIL/uL (ref 3.87–5.11)
RDW: 15.7 % — ABNORMAL HIGH (ref 11.5–15.5)
WBC: 8.9 10*3/uL (ref 4.0–10.5)
nRBC: 0 % (ref 0.0–0.2)

## 2024-04-21 LAB — GLUCOSE, CAPILLARY
Glucose-Capillary: 89 mg/dL (ref 70–99)
Glucose-Capillary: 85 mg/dL (ref 70–99)
Glucose-Capillary: 141 mg/dL — ABNORMAL HIGH (ref 70–99)

## 2024-04-21 MED ORDER — SODIUM CHLORIDE 0.9 % IV BOLUS: 500.0000 mL | Freq: Once | INTRAVENOUS | Status: DC

## 2024-04-21 MED ORDER — AMIODARONE HCL 200 MG PO TABS: 200.0000 mg | ORAL_TABLET | Freq: Two times a day (BID) | ORAL | 0 refills | Status: DC

## 2024-04-21 MED ORDER — CINACALCET HCL 30 MG PO TABS: 30.0000 mg | ORAL_TABLET | Freq: Every evening | ORAL | 0 refills | Status: DC

## 2024-04-21 MED ORDER — AMIODARONE HCL 200 MG PO TABS: 200.0000 mg | ORAL_TABLET | Freq: Every day | ORAL | 2 refills | Status: DC

## 2024-04-21 MED ORDER — OXYCODONE HCL 5 MG PO TABS: 5.0000 mg | ORAL_TABLET | Freq: Two times a day (BID) | ORAL | 0 refills | Status: DC | PRN

## 2024-04-21 MED ORDER — MIDODRINE HCL 5 MG PO TABS: 10.0000 mg | ORAL_TABLET | Freq: Once | ORAL | Status: DC

## 2024-04-21 NOTE — TOC Transition Note (Addendum)
 Transition of Care (TOC) - Discharge Note For dc today, [employer] to supply shower stool to room for patient.  She states she has transportation home.   Patient Details  Name: [name] MRN: [id number] Date of Birth: 09-Feb-1981  Transition of Care [facility]) CM/SW Contact:  Jennett Model, RN Phone Number: 04/21/2024, 10:23 AM   Clinical Narrative:    For dc today, NCM spoke with patient, she would like the shower stool, she does not have a preference of which agency.  NCM made referral to Jermaine with [employer], this will be brought to her room pror to dc.  Per Staff RN patient will be doing her own dressing.      Barriers to Discharge: Continued Medical Work up   Patient Goals and CMS Choice Patient states their goals for this hospitalization and ongoing recovery are:: to return home          Discharge Placement                       Discharge Plan and Services Additional resources added to the After Visit Summary for                                       Social Drivers of Health (SDOH) Interventions SDOH Screenings   Food Insecurity: Patient Declined (04/17/2024)  Housing: Patient Declined (04/17/2024)  Transportation Needs: Patient Declined (04/17/2024)  Utilities: Patient Declined (04/17/2024)  Depression (PHQ2-9): Low Risk  (04/29/2022)  Tobacco Use: High Risk (04/17/2024)     Readmission Risk Interventions    04/17/2022    8:36 AM  Readmission Risk Prevention Plan  Transportation Screening Complete  PCP or Specialist Appt within 3-5 Days Complete  HRI or Home Care Consult Complete  Social Work Consult for Recovery Care Planning/Counseling Complete  Palliative Care Screening Not Applicable  Medication Review Oceanographer) Complete

## 2024-04-21 NOTE — Discharge Planning (Signed)
 [facility] Patient Discharge Orders- [facility] CLINIC: [facility]  Patient's name: LYNDAL ALAMILLO Admit/DC Dates: 04/17/2024 - 04/21/2024  Discharge Diagnoses: Acute hypoxemic resp failure , volume overload   A flutter with RVR    Aranesp : Given: No  Date and amount of last dose: NA  PRBC's Given: No Date/# of units: NA Last Hgb: 12.7 ESA dose for discharge: mircera 0 mcg IV q 2 weeks. Dose per protocol IV Iron  dose at discharge: Per protocol  Heparin  change: No  EDW Change: No New EDW: NA  Bath Change: No  Access intervention/Change: NO Details:  Hectorol /Calcitriol  change: No  Discharge Labs: Calcium  8.2 Phosphorus 3.1 Albumin  2.2 K+ 3.9  IV Antibiotics: No Details:  On Coumadin?: No Last INR: Next INR: Managed By:   OTHER/APPTS/LAB ORDERS:    D/C Meds to be reconciled by nurse after every discharge.  Completed By: Jacobo Masters [facility] [phone]    Reviewed by: MD:______ RN_______

## 2024-04-21 NOTE — Progress Notes (Signed)
 D/C order noted. Contacted [facility] to be advised of pt's d/c today and that pt should resume care tomorrow.   Olivia Canter Renal Navigator [phone]

## 2024-04-21 NOTE — Plan of Care (Signed)
  Problem: Education: Goal: Ability to demonstrate management of disease process will improve Outcome: Progressing   Problem: Activity: Goal: Capacity to carry out activities will improve Outcome: Progressing   Problem: Cardiac: Goal: Ability to achieve and maintain adequate cardiopulmonary perfusion will improve Outcome: Progressing   Problem: Health Behavior/Discharge Planning: Goal: Ability to manage health-related needs will improve Outcome: Progressing   Problem: Activity: Goal: Risk for activity intolerance will decrease Outcome: Progressing   Problem: Nutrition: Goal: Adequate nutrition will be maintained Outcome: Progressing

## 2024-04-21 NOTE — Progress Notes (Signed)
 Occupational Therapy Treatment Patient Details Name: [name] MRN: [id number] DOB: 23-Nov-1981 Today's Date: 04/21/2024   History of present illness [name] is a 43 y/o female admitted 04/17/24 with SOB and chest tightness/coughing. CXR showed cardiomegaly, small bilateral pleural effusions. Dx with CHF. She does have a chronic wound on her R thigh. PMH includes afib/aflutter, ESRD TThSa, polysubstance abuse, heart murmur, malnutrition, ESRD on HD, subdural hematoma in 11/2022. Of note: multiple readmissions 12/24, 1/26-01/30/24   OT comments  Pt. Seen for skilled OT treatment session.  On RA upon arrival, states she take o2 tubing out for eating. 94% throughout session on RA.  Pt. Able to complete bed mobility in/out S.  UB dressing MIN A.  LB dressing set up.  Side steps without use of DME for back to bed with CGA no lob noted.  Cont. With acute OT POC.        If plan is discharge home, recommend the following:  Help with stairs or ramp for entrance;A little help with bathing/dressing/bathroom;Assistance with cooking/housework;Assist for transportation   Equipment Recommendations  Tub/shower seat    Recommendations for Other Services      Precautions / Restrictions Precautions Precautions: Fall Recall of Precautions/Restrictions: Intact Precaution/Restrictions Comments: cold fingers, SpO2 reading tough       Mobility Bed Mobility Overal bed mobility: Needs Assistance Bed Mobility: Supine to Sit, Sit to Supine     Supine to sit: Supervision Sit to supine: Supervision   General bed mobility comments: supervision for line management    Transfers Overall transfer level: Needs assistance   Transfers: Sit to/from Stand Sit to Stand: Contact guard assist           General transfer comment: sit/stand with side steps x2 towards hob, no DME no lob     Balance                                           ADL either performed or assessed with  clinical judgement   ADL Overall ADL's : Needs assistance/impaired                 Upper Body Dressing : Minimal assistance Upper Body Dressing Details (indicate cue type and reason): donning gown due to line management Lower Body Dressing: Set up;Sitting/lateral leans Lower Body Dressing Details (indicate cue type and reason): donning socks, doffed in bed pulling each LE toward chest and pulling socks off             Functional mobility during ADLs: Contact guard assist General ADL Comments: assistance to don satin hair bonnet    Extremity/Trunk Assessment              Vision       Perception     Praxis     Communication Communication Communication: No apparent difficulties   Cognition Arousal: Alert Behavior During Therapy: WFL for tasks assessed/performed, Flat affect Cognition: No apparent impairments             OT - Cognition Comments: Pt quiet, but answers questions appropriately, and  follows cues accurately                 Following commands: Intact        Cueing   Cueing Techniques: Verbal cues, Gestural cues  Exercises      Shoulder Instructions  General Comments      Pertinent Vitals/ Pain       Pain Assessment Pain Assessment: 0-10 Pain Score: 8  Pain Location: R thigh Pain Descriptors / Indicators: Discomfort, Guarding, Sore Pain Intervention(s): Limited activity within patient's tolerance, Monitored during session, Patient requesting pain meds-RN notified  Home Living                                          Prior Functioning/Environment              Frequency  Min 2X/week        Progress Toward Goals  OT Goals(current goals can now be found in the care plan section)  Progress towards OT goals: Progressing toward goals     Plan      Co-evaluation                 AM-PAC OT "6 Clicks" Daily Activity     Outcome Measure   Help from another person eating meals?:  None Help from another person taking care of personal grooming?: A Little Help from another person toileting, which includes using toliet, bedpan, or urinal?: A Little Help from another person bathing (including washing, rinsing, drying)?: A Little Help from another person to put on and taking off regular upper body clothing?: A Little Help from another person to put on and taking off regular lower body clothing?: A Little 6 Click Score: 19    End of Session    OT Visit Diagnosis: Unsteadiness on feet (R26.81);Muscle weakness (generalized) (M62.81);Pain Pain - Right/Left: Right Pain - part of body: Leg   Activity Tolerance Patient tolerated treatment well   Patient Left in bed;with call bell/phone within reach;with bed alarm set   Nurse Communication Other (comment) (secure chat session details)        Time: [phone] OT Time Calculation (min): 9 min  Charges: OT General Charges $OT Visit: 1 Visit OT Treatments $Self Care/Home Management : 8-22 mins  Howell Macintosh, COTA/L Acute Rehabilitation [phone]   Leory Rands Lorraine-COTA/L  04/21/2024, 9:04 AM

## 2024-04-21 NOTE — Progress Notes (Signed)
 [facility] NEPHROLOGY PROGRESS NOTE  Assessment/ Plan: Pt is a 43 y.o. 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  Dialysis Orders:  TTS - [facility] 3:45hr, 400/A1.5, EDW 39.5kg, 2K/2Ca bath, RUE AVF, no heparin  - no ESA, Hgb > 12 - Hectoral 2mcg IV q HD  # Acute on Chronic Resp Failure/pulm edema: She had dialysis yesterday.  Breathing is much better after dialysis.  Currently on room air.    #ESRD:  Usual TTS schedule -HD yesterday, no heparin .  Continue her regular schedule, next HD tomorrow. #Hypotension/volume: On midodrine , UF as tolerated #Anemia: Hgb > 12, no ESA needed #Metabolic bone disease: Ca high, hold VDRA for now. Continue home binders. #Nutrition:  Alb low, rec high protein supplements #SLE  Ok to discharge from renal perspective.  Subjective: Seen and examined.  Denies nausea, vomiting, chest pain or shortness of breath.  Objective Vital signs in last 24 hours: Vitals:   04/21/24 0427 04/21/24 0438 04/21/24 0528 04/21/24 0729  BP:  (!) 137/59  (!) 113/55  Pulse:  (!) 106  98  Resp: 20 19  17   Temp: 98.8 F (37.1 C)   98 F (36.7 C)  TempSrc: Oral   Oral  SpO2: 93% 94%  98%  Weight:   39.7 kg   Height:       Weight change: -2.2 kg  Intake/Output Summary (Last 24 hours) at 04/21/2024 1019 Last data filed at 04/20/2024 1945 Gross per 24 hour  Intake 240 ml  Output 2000 ml  Net -1760 ml       Labs: RENAL PANEL Recent Labs  Lab 04/17/24 0555 04/17/24 0758 04/19/24 0807 04/19/24 1344 04/20/24 1600  NA 137  --  129*  --  130*  K 5.2*  --  3.6  --  3.9  CL 87*  --  85*  --  91*  CO2 18*  --  24  --  24  GLUCOSE 63*  --  122* 85 94  BUN 50*  --  52*  --  34*  CREATININE 6.49*  --  5.77*  --  4.40*  CALCIUM  10.1  --  9.4  --  8.2*  PHOS  --   --  5.0*  --  3.1  ALBUMIN   --  2.6* 2.1*  --  2.2*    Liver Function Tests: Recent Labs  Lab 04/17/24 0758 04/19/24 0807  04/20/24 1600  AST 39  --   --   ALT 23  --   --   ALKPHOS 352*  --   --   BILITOT 1.3*  --   --   PROT 8.1  --   --   ALBUMIN  2.6* 2.1* 2.2*   No results for input(s): "LIPASE", "AMYLASE" in the last 168 hours. No results for input(s): "AMMONIA" in the last 168 hours. CBC: Recent Labs    04/17/24 0555 04/18/24 0319 04/19/24 0248 04/20/24 0331 04/21/24 0226  HGB 12.7 13.1 12.2 11.6* 12.7  MCV 84.2 83.1 84.2 84.3 83.8    Cardiac Enzymes: No results for input(s): "CKTOTAL", "CKMB", "CKMBINDEX", "TROPONINI" in the last 168 hours. CBG: Recent Labs  Lab 04/20/24 1517 04/20/24 2100 04/21/24 0104 04/21/24 0424 04/21/24 0801  GLUCAP 99 54* 89 85 141*    Iron  Studies: No results for input(s): "IRON ", "TIBC", "TRANSFERRIN", "FERRITIN" in the last 72 hours. Studies/Results: No results found.   Medications: Infusions:  anticoagulant sodium citrate   Scheduled Medications:  amiodarone   200 mg Oral BID   [START ON 04/22/2024] amiodarone   200 mg Oral Daily   Chlorhexidine  Gluconate Cloth  6 each Topical Q0600   cinacalcet   30 mg Oral QPM   feeding supplement (NEPRO CARB STEADY)  237 mL Oral TID BM   melatonin  3 mg Oral QHS   midodrine   10 mg Oral TID WC   multivitamin  1 tablet Oral QHS   sevelamer  carbonate  2,400 mg Oral TID with meals   sodium chloride  flush  3 mL Intravenous Q12H    have reviewed scheduled and prn medications.  Physical Exam: General:NAD, comfortable Heart:RRR, s1s2 nl Lungs:clear b/l, no crackle Abdomen:soft, Non-tender, non-distended Extremities:No edema Dialysis Access: AV fistula.  Maxmilian Trostel Prasad Dalylah Ramey 04/21/2024,10:19 AM  LOS: 4 days

## 2024-04-21 NOTE — Progress Notes (Signed)
 Patient heart rate is controlled.  No new changes. Medications changed made yesterday Discharge on current cardiac medication.  We will sign off.

## 2024-04-21 NOTE — Discharge Summary (Signed)
 Physician Discharge Summary  [name] UXL:[id number] DOB: July 10, 1981 DOA: 04/17/2024  PCP: Senaida Dama, NP  Admit date: 04/17/2024 Discharge date: 04/21/2024  Admitted From: Home Disposition: Home with outpatient PT OT  Recommendations for Outpatient Follow-up:  Follow up with PCP in 1-2 weeks Dialysis is scheduled, next dialysis tomorrow  Home Health: Patient PT OT Equipment/Devices: Available at home  Discharge Condition: Fair CODE STATUS: Full code Diet recommendation: Low-salt diet, nutritional supplements  Discharge summary: 43 y.o. female with past medical history  of atrial flutter and  A-fib, HFrEF with a EF of 30 to 35% on most recent echo, end-stage renal disease on hemodialysis Tuesday Thursday and Saturday schedule, valvular heart disease with mitral stenosis and regurg, aortic insufficiency, admitted with shortness of breath.  On initial presentation patient was tachycardic and dyspneic and started on 3 L nasal cannula.  Remained in the hospital due to a-flutter and shortness of breath. Treated with amiodarone  infusion and rate controlled.  Going home today.  Very high risk of readmission given advanced cardiovascular disease and dialysis.  Today she is stable to discharge home.  Acute hypoxemic resp failure , volume overload, heart failure with reduced ejection fraction and cardiomyopathy with known EF 30%: Shortness of breath is multifactorial.  Volume overload secondary to known cardiomyopathy and inadequate volume removal on dialysis. Patient received multiple consecutive sessions of hemodialysis with improvement.  On room air now.  Volume management with ongoing dialysis as outpatient.   A flutter with RVR: Treated with amiodarone  drip.  Not on anticoagulation due to recurrent falls.  History of subdural hematoma.  Now sinus rhythm.  Cardiology to was following.  Now on oral amiodarone .   ESRD on hemodialysis: As above.  Volume managed.  Patient on additional  midodrine  support.   Hypoglycemia: false.  Patient without any symptoms with capillary blood sugars less than 50. No recurrent issues now.   Nutrition Status: Nutrition Problem: Severe Malnutrition Etiology: chronic illness (ESRD-HD, Lupus, arthritis) Signs/Symptoms: severe muscle depletion, severe fat depletion, percent weight loss Percent weight loss: 12 % Interventions: MVI, Nepro shake   High risk of readmission.  Stable today to discharge home.  She will do outpatient therapies.   Discharge Diagnoses:  Active Problems:   SOB (shortness of breath)    Discharge Instructions  Discharge Instructions     Ambulatory referral to Occupational Therapy   Complete by: As directed    Ambulatory referral to Physical Therapy   Complete by: As directed    Diet - low sodium heart healthy   Complete by: As directed    Discharge wound care:   Complete by: As directed    R upper thigh wound with Vashe wound cleanser Timm Foot [phone]), do not rinse and allow to air dry.  Apply silver hydrofiber (Lawson [phone]) to wound bed daily, cover with dry gauze and silicone foam or ABD pad and tape whichever is preferred.   Increase activity slowly   Complete by: As directed       Allergies as of 04/21/2024       Reactions   Cephalosporins Rash   To both keflex  and cefazolin .  Potential bullous lesion from Ceftriaxone  11/2023.   Tobramycin Sulfate Swelling   Eye swelling   Vancomycin  Swelling        Medication List     TAKE these medications    amiodarone  200 MG tablet Commonly known as: PACERONE  Take 1 tablet (200 mg total) by mouth 2 (two) times daily for 2 days.  amiodarone  200 MG tablet Commonly known as: PACERONE  Take 1 tablet (200 mg total) by mouth daily. Start taking on: April 24, 2024   cinacalcet  30 MG tablet Commonly known as: SENSIPAR  Take 1 tablet (30 mg total) by mouth every evening.   midodrine  10 MG tablet Commonly known as: PROAMATINE  Take 1 tablet (10 mg  total) by mouth 3 (three) times daily with meals.   sevelamer  carbonate 800 MG tablet Commonly known as: RENVELA  Take 3 tablets (2,400 mg total) by mouth 3 (three) times daily.               Durable Medical Equipment  (From admission, onward)           Start     Ordered   04/21/24 1021  For home use only DME Shower stool  Once        04/21/24 1021              Discharge Care Instructions  (From admission, onward)           Start     Ordered   04/21/24 0000  Discharge wound care:       Comments: R upper thigh wound with Vashe wound cleanser Timm Foot [phone]), do not rinse and allow to air dry.  Apply silver hydrofiber (Lawson [phone]) to wound bed daily, cover with dry gauze and silicone foam or ABD pad and tape whichever is preferred.   04/21/24 1012            Follow-up Information     [facility]. Schedule an appointment as soon as possible for a visit.   Specialty: Rehabilitation Why: Call to schedule apt for physical and occupational therapy Contact information: [address] [place]  [address] [phone]               Allergies  Allergen Reactions   Cephalosporins Rash    To both keflex  and cefazolin .  Potential bullous lesion from Ceftriaxone  11/2023.   Tobramycin Sulfate Swelling    Eye swelling   Vancomycin  Swelling    Consultations: Cardiology Nephrology   Procedures/Studies: CT ABDOMEN PELVIS W CONTRAST Result Date: 04/17/2024 CLINICAL DATA:  Abdominal pain, acute, nonlocalized EXAM: CT ABDOMEN AND PELVIS WITH CONTRAST TECHNIQUE: Multidetector CT imaging of the abdomen and pelvis was performed using the standard protocol following bolus administration of intravenous contrast. RADIATION DOSE REDUCTION: This exam was performed according to the departmental dose-optimization program which includes automated exposure control, adjustment of the mA and/or kV according to patient  size and/or use of iterative reconstruction technique. CONTRAST:  75mL OMNIPAQUE  IOHEXOL  350 MG/ML SOLN COMPARISON:  December 15, 2023 FINDINGS: Lower chest: For findings above the diaphragm, please see the separately dictated CT of the chest report, which was performed concurrently. Hepatobiliary: No mass. Reflux of contrast into the hepatic veins. Heterogeneous enhancement of the left hepatic lobe and inferior right hepatic tip.No radiopaque stones or wall thickening of the gallbladder.No intrahepatic or extrahepatic biliary ductal dilation. Pancreas: No mass or main ductal dilation.No peripancreatic inflammation or fluid collection. Spleen: No mass. Heterogeneous enhancement throughout the subcapsular spleen, likely due to the phase of contrast. Adrenals/Urinary Tract: No adrenal masses. Severe renal atrophy bilaterally. No mass. No nephrolithiasis or hydronephrosis. Decompressed urinary bladder without visualized focal abnormality. Stomach/Bowel: The stomach is decompressed without focal abnormality. No small bowel wall thickening or inflammation. No small bowel obstruction. Normal appendix. Vascular/Lymphatic: No aortic aneurysm. No intraabdominal or pelvic lymphadenopathy. Reproductive: The uterus and ovaries  are within normal limits for patient's age.No free pelvic fluid. Other: No pneumoperitoneum. Small volume ascites with diffuse mesenteric edema. Musculoskeletal: No acute fracture or destructive lesion.Renal osteodystrophy changes. 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 IMPRESSION: 1. No acute intra-abdominal or pelvic abnormality. 2. Small volume ascites with diffuse mesenteric edema, likely related to the patient's volume status. 3. Reflux of contrast into the hepatic veins, consistent with underlying cardiac dysfunction. Heterogeneous enhancement of the left hepatic lobe and inferior right hepatic tip is also likely related to the cardiac dysfunction (congestive hepatopathy).  Correlation with liver enzymes recommended to exclude acute hepatitis. Electronically Signed   By: Rance Burrows M.D.   On: 04/17/2024 12:04   CT Angio Chest PE W and/or Wo Contrast Result Date: 04/17/2024 CLINICAL DATA:  Pulmonary embolism (PE) suspected, high prob EXAM: CT ANGIOGRAPHY CHEST WITH CONTRAST TECHNIQUE: Multidetector CT imaging of the chest was performed using the standard protocol during bolus administration of intravenous contrast. Multiplanar CT image reconstructions and MIPs were obtained to evaluate the vascular anatomy. RADIATION DOSE REDUCTION: This exam was performed according to the departmental dose-optimization program which includes automated exposure control, adjustment of the mA and/or kV according to patient size and/or use of iterative reconstruction technique. CONTRAST:  75mL OMNIPAQUE  IOHEXOL  350 MG/ML SOLN COMPARISON:  January 08, 2024 FINDINGS: Pulmonary Embolism: The segmental and subsegmental branches of the left lower lobe are not well opacified by the contrast bolus. Otherwise, no visualized pulmonary embolus in the remaining pulmonary arteries. Cardiovascular: Severe cardiomegaly. No aortic aneurysm. Diffuse aortic atherosclerosis. Extensive multi-vessel coronary atherosclerosis. Mediastinum/Nodes: No mediastinal mass. Enlarged multi station mediastinal and bilateral hilar lymph nodes, unchanged, likely reactive. Lungs/Pleura: The trachea is midline and patent. Mild diffuse bronchial wall thickening with interlobular septal thickening throughout the lungs. No lobar airspace consolidation or pneumothorax. Small bilateral pleural effusions with hazy ground-glass attenuation throughout the lungs. 4 mm right lower lobe nodule (axial 88), unchanged. Musculoskeletal: No acute fracture or destructive bone lesion. Renal osteodystrophy changes. Multilevel thoracic osteophytosis. Upper Abdomen: For findings below the diaphragm, please see the separately dictated CT of the abdomen  and pelvis report, which was performed concurrently. Review of the MIP images confirms the above findings. IMPRESSION: 1. The segmental and subsegmental branches of the left lower lobe are not well opacified by the contrast bolus. Otherwise, no visualized pulmonary embolus in the remaining pulmonary arteries. 2. Severe cardiomegaly, unchanged, with superimposed findings of pulmonary edema. Small bilateral pleural effusions. 3. Right lower lobe pulmonary nodule measuring 4 mm, unchanged. Follow-up could be considered, as documented below. 4. For findings below the diaphragm, please see the separately dictated CT of the abdomen and pelvis report, which was performed concurrently. No follow-up needed if patient is low-risk.This recommendation follows the consensus statement: Guidelines for Management of Incidental Pulmonary Nodules Detected on CT Images: From the Fleischner Society 2017; Radiology 2017; 284:228-243. Aortic Atherosclerosis (ICD10-I70.0). Electronically Signed   By: Rance Burrows M.D.   On: 04/17/2024 11:49   DG Chest 2 View Result Date: 04/17/2024 CLINICAL DATA:  Shortness of breath EXAM: CHEST - 2 VIEW COMPARISON:  01/23/2024 FINDINGS: Unchanged cardiac enlargement. Small bilateral pleural effusions, left greater than right. Diffuse increase interstitial markings noted bilaterally. Bibasilar atelectasis. Visualized osseous structures appear intact. IMPRESSION: Cardiomegaly, small bilateral pleural effusions and diffuse increase interstitial markings compatible with CHF. Electronically Signed   By: Kimberley Penman M.D.   On: 04/17/2024 06:20   (Echo, Carotid, EGD, Colonoscopy, ERCP)    Subjective: Patient seen in the morning  rounds.  Denies any complaints.  She is comfortable with plan to go home.  Next [name].   Discharge Exam: Vitals:   04/21/24 0438 04/21/24 0729  BP: (!) 137/59 (!) 113/55  Pulse: (!) 106 98  Resp: 19 17  Temp:  98 F (36.7 C)  SpO2: 94% 98%   Vitals:    04/21/24 0427 04/21/24 0438 04/21/24 0528 04/21/24 0729  BP:  (!) 137/59  (!) 113/55  Pulse:  (!) 106  98  Resp: 20 19  17   Temp: 98.8 F (37.1 C)   98 F (36.7 C)  TempSrc: Oral   Oral  SpO2: 93% 94%  98%  Weight:   39.7 kg   Height:        General: Pt is alert, awake, not in acute distress Thin and cachectic. Cardiovascular: RRR, S1/S2 +, no rubs, no gallops Respiratory: CTA bilaterally, no wheezing, no rhonchi Abdominal: Soft, NT, ND, bowel sounds + Extremities: no edema, no cyanosis Right upper extremity AV fistula present.    The results of significant diagnostics from this hospitalization (including imaging, microbiology, ancillary and laboratory) are listed below for reference.     Microbiology: Recent Results (from the past 240 hours)  Resp panel by RT-PCR (RSV, Flu A&B, Covid) Anterior Nasal Swab     Status: None   Collection Time: 04/17/24  5:38 AM   Specimen: Anterior Nasal Swab  Result Value Ref Range Status   SARS Coronavirus 2 by RT PCR NEGATIVE NEGATIVE Final   Influenza A by PCR NEGATIVE NEGATIVE Final   Influenza B by PCR NEGATIVE NEGATIVE Final    Comment: (NOTE) The Xpert Xpress SARS-CoV-2/FLU/RSV plus assay is intended as an aid in the diagnosis of influenza from Nasopharyngeal swab specimens and should not be used as a sole basis for treatment. Nasal washings and aspirates are unacceptable for Xpert Xpress SARS-CoV-2/FLU/RSV testing.  Fact Sheet for Patients: BloggerCourse.com  Fact Sheet for Healthcare Providers: SeriousBroker.it  This test is not yet approved or cleared by the [place]  FDA and has been authorized for detection and/or diagnosis of SARS-CoV-2 by FDA under an Emergency Use Authorization (EUA). This EUA will remain in effect (meaning this test can be used) for the duration of the COVID-19 declaration under Section 564(b)(1) of the Act, 21 U.S.C. section 360bbb-3(b)(1),  unless the authorization is terminated or revoked.     Resp Syncytial Virus by PCR NEGATIVE NEGATIVE Final    Comment: (NOTE) Fact Sheet for Patients: BloggerCourse.com  Fact Sheet for Healthcare Providers: SeriousBroker.it  This test is not yet approved or cleared by the [place]  FDA and has been authorized for detection and/or diagnosis of SARS-CoV-2 by FDA under an Emergency Use Authorization (EUA). This EUA will remain in effect (meaning this test can be used) for the duration of the COVID-19 declaration under Section 564(b)(1) of the Act, 21 U.S.C. section 360bbb-3(b)(1), unless the authorization is terminated or revoked.  Performed at [facility], [address] [place], [place] [address]   MRSA Next Gen by PCR, Nasal     Status: Abnormal   Collection Time: 04/19/24  4:47 PM   Specimen: Nasal Mucosa; Nasal Swab  Result Value Ref Range Status   MRSA by PCR Next Gen DETECTED (A) NOT DETECTED Final    Comment: RESULT CALLED TO, READ BACK BY AND VERIFIED WITH: RN NICOLE ATKINS ON 04/19/24 @ 2019 BY DRT (NOTE) The GeneXpert MRSA Assay (FDA approved for NASAL specimens only), is one component of  a comprehensive MRSA colonization surveillance program. It is not intended to diagnose MRSA infection nor to guide or monitor treatment for MRSA infections. Test performance is not FDA approved in patients less than 25 years old. Performed at [facility], [address], [place], [place] [address]      Labs: BNP (last 3 results) Recent Labs    12/13/23 0610 04/17/24 0555  BNP >4,500.0* >4,500.0*   Basic Metabolic Panel: Recent Labs  Lab 04/17/24 0555 04/19/24 0807 04/19/24 1344 04/20/24 1600  NA 137 129*  --  130*  K 5.2* 3.6  --  3.9  CL 87* 85*  --  91*  CO2 18* 24  --  24  GLUCOSE 63* 122* 85 94  BUN 50* 52*  --  34*  CREATININE 6.49* 5.77*  --  4.40*  CALCIUM  10.1 9.4  --  8.2*  PHOS  --   5.0*  --  3.1   Liver Function Tests: Recent Labs  Lab 04/17/24 0758 04/19/24 0807 04/20/24 1600  AST 39  --   --   ALT 23  --   --   ALKPHOS 352*  --   --   BILITOT 1.3*  --   --   PROT 8.1  --   --   ALBUMIN  2.6* 2.1* 2.2*   No results for input(s): "LIPASE", "AMYLASE" in the last 168 hours. No results for input(s): "AMMONIA" in the last 168 hours. CBC: Recent Labs  Lab 04/17/24 0555 04/18/24 0319 04/19/24 0248 04/20/24 0331 04/21/24 0226  WBC 5.2 4.9 4.9 6.3 8.9  NEUTROABS 2.3  --   --   --   --   HGB 12.7 13.1 12.2 11.6* 12.7  HCT 38.5 38.9 37.4 35.9* 38.8  MCV 84.2 83.1 84.2 84.3 83.8  PLT 175 169 155 151 154   Cardiac Enzymes: No results for input(s): "CKTOTAL", "CKMB", "CKMBINDEX", "TROPONINI" in the last 168 hours. BNP: Invalid input(s): "POCBNP" CBG: Recent Labs  Lab 04/20/24 1517 04/20/24 2100 04/21/24 0104 04/21/24 0424 04/21/24 0801  GLUCAP 99 54* 89 85 141*   D-Dimer No results for input(s): "DDIMER" in the last 72 hours. Hgb A1c Recent Labs    04/19/24 1344  HGBA1C 6.1*   Lipid Profile Recent Labs    04/19/24 1344  CHOL 58  HDL 14*  LDLCALC 22  TRIG 604  CHOLHDL 4.1   Thyroid function studies No results for input(s): "TSH", "T4TOTAL", "T3FREE", "THYROIDAB" in the last 72 hours.  Invalid input(s): "FREET3" Anemia work up No results for input(s): "VITAMINB12", "FOLATE", "FERRITIN", "TIBC", "IRON ", "RETICCTPCT" in the last 72 hours. Urinalysis    Component Value Date/Time   COLORURINE YELLOW 09/25/2015 1412   APPEARANCEUR CLOUDY (A) 09/25/2015 1412   LABSPEC 1.018 09/25/2015 1412   PHURINE 8.0 09/25/2015 1412   GLUCOSEU NEGATIVE 09/25/2015 1412   HGBUR LARGE (A) 09/25/2015 1412   BILIRUBINUR NEGATIVE 09/25/2015 1412   KETONESUR NEGATIVE 09/25/2015 1412   PROTEINUR >300 (A) 09/25/2015 1412   UROBILINOGEN 0.2 09/25/2015 1412   NITRITE NEGATIVE 09/25/2015 1412   LEUKOCYTESUR MODERATE (A) 09/25/2015 1412   Sepsis  Labs Recent Labs  Lab 04/18/24 0319 04/19/24 0248 04/20/24 0331 04/21/24 0226  WBC 4.9 4.9 6.3 8.9   Microbiology Recent Results (from the past 240 hours)  Resp panel by RT-PCR (RSV, Flu A&B, Covid) Anterior Nasal Swab     Status: None   Collection Time: 04/17/24  5:38 AM   Specimen: Anterior Nasal Swab  Result Value  Ref Range Status   SARS Coronavirus 2 by RT PCR NEGATIVE NEGATIVE Final   Influenza A by PCR NEGATIVE NEGATIVE Final   Influenza B by PCR NEGATIVE NEGATIVE Final    Comment: (NOTE) The Xpert Xpress SARS-CoV-2/FLU/RSV plus assay is intended as an aid in the diagnosis of influenza from Nasopharyngeal swab specimens and should not be used as a sole basis for treatment. Nasal washings and aspirates are unacceptable for Xpert Xpress SARS-CoV-2/FLU/RSV testing.  Fact Sheet for Patients: BloggerCourse.com  Fact Sheet for Healthcare Providers: SeriousBroker.it  This test is not yet approved or cleared by the [place]  FDA and has been authorized for detection and/or diagnosis of SARS-CoV-2 by FDA under an Emergency Use Authorization (EUA). This EUA will remain in effect (meaning this test can be used) for the duration of the COVID-19 declaration under Section 564(b)(1) of the Act, 21 U.S.C. section 360bbb-3(b)(1), unless the authorization is terminated or revoked.     Resp Syncytial Virus by PCR NEGATIVE NEGATIVE Final    Comment: (NOTE) Fact Sheet for Patients: BloggerCourse.com  Fact Sheet for Healthcare Providers: SeriousBroker.it  This test is not yet approved or cleared by the [place]  FDA and has been authorized for detection and/or diagnosis of SARS-CoV-2 by FDA under an Emergency Use Authorization (EUA). This EUA will remain in effect (meaning this test can be used) for the duration of the COVID-19 declaration under Section 564(b)(1) of the Act,  21 U.S.C. section 360bbb-3(b)(1), unless the authorization is terminated or revoked.  Performed at [facility], [address] [place], [place] [address]   MRSA Next Gen by PCR, Nasal     Status: Abnormal   Collection Time: 04/19/24  4:47 PM   Specimen: Nasal Mucosa; Nasal Swab  Result Value Ref Range Status   MRSA by PCR Next Gen DETECTED (A) NOT DETECTED Final    Comment: RESULT CALLED TO, READ BACK BY AND VERIFIED WITH: RN NICOLE ATKINS ON 04/19/24 @ 2019 BY DRT (NOTE) The GeneXpert MRSA Assay (FDA approved for NASAL specimens only), is one component of a comprehensive MRSA colonization surveillance program. It is not intended to diagnose MRSA infection nor to guide or monitor treatment for MRSA infections. Test performance is not FDA approved in patients less than 63 years old. Performed at [facility], [address], [place], [place] [address]      Time coordinating discharge: 35 minutes  SIGNED:   Vada Garibaldi, MD  [facility] 04/21/2024, 10:21 AM

## 2024-04-22 ENCOUNTER — Telehealth: Payer: Self-pay | Admitting: Nurse Practitioner

## 2024-04-22 NOTE — Telephone Encounter (Signed)
 Transition of Care - Initial Contact after Hospitalization  Date of discharge:   Date of contact: 04/22/24  Method: Phone Spoke to: Patient  Patient contacted to discuss transition of care from recent inpatient hospitalization. Patient was admitted to [facility] from 04/17/2024 - 04/21/2024 with discharge diagnosis of Volume overload Acute hypoxic respiratory failure   The discharge medication list was reviewed. Patient says she hasn't picked up amiodarone  yet but reminded her that she isn't supposed to start medication until Monday. Says she went to HD today and stayed full treatment.   Patient will return to his/her outpatient HD unit on: 04/22/2024 No other concerns at this time.

## 2024-04-24 ENCOUNTER — Telehealth: Payer: Self-pay | Admitting: *Deleted

## 2024-04-24 NOTE — Transitions of Care (Post Inpatient/ED Visit) (Signed)
   04/24/2024  Name: [name] MRN: [id number] DOB: [birth year]  Today's TOC FU Call Status: Today's TOC FU Call Status:: Unsuccessful Call (1st Attempt) Unsuccessful Call (1st Attempt) Date: 04/24/24  Attempted to reach the patient regarding the most recent Inpatient/ED visit.  Follow Up Plan: Additional outreach attempts will be made to reach the patient to complete the Transitions of Care (Post Inpatient/ED visit) call.   Una Ganser BSN RN [facility] Care Management Coordinator [email or web address] Direct Dial: [phone]  Fax: [phone] Website: [email or web address]

## 2024-04-25 ENCOUNTER — Telehealth: Payer: Self-pay

## 2024-04-25 NOTE — Transitions of Care (Post Inpatient/ED Visit) (Signed)
   04/25/2024  Name: WILHELMINIA LOE MRN: [id number] DOB: 1981-04-13  Today's TOC FU Call Status: Today's TOC FU Call Status:: Unsuccessful Call (2nd Attempt) Unsuccessful Call (2nd Attempt) Date: 04/25/24  Attempted to reach the patient regarding the most recent Inpatient/ED visit.  Follow Up Plan: Additional outreach attempts will be made to reach the patient to complete the Transitions of Care (Post Inpatient/ED visit) call.   Carinna Newhart J. Shanon Becvar RN, MSN [facility], [facility] RN Care Manager Direct Dial: [phone]  Fax: [phone] Website: [email or web address]

## 2024-04-26 ENCOUNTER — Telehealth: Payer: Self-pay

## 2024-04-26 NOTE — Transitions of Care (Post Inpatient/ED Visit) (Signed)
   04/26/2024  Name: [name] MRN: [id number] DOB: 09-Apr-1981  Today's TOC FU Call Status: Today's TOC FU Call Status:: Unsuccessful Call (3rd Attempt) Unsuccessful Call (3rd Attempt) Date: 04/26/24  Attempted to reach the patient regarding the most recent Inpatient/ED visit.  Follow Up Plan: No further outreach attempts will be made at this time. We have been unable to contact the patient.  Jamari Moten J. Amrita Radu RN, MSN [facility], [facility] RN Care Manager Direct Dial: [phone]  Fax: [phone] Website: [email or web address]

## 2024-05-05 ENCOUNTER — Ambulatory Visit: Admitting: Physical Therapy

## 2024-05-05 ENCOUNTER — Encounter: Payer: Self-pay | Admitting: Physical Therapy

## 2024-05-05 ENCOUNTER — Ambulatory Visit: Attending: Internal Medicine | Admitting: Occupational Therapy

## 2024-05-05 ENCOUNTER — Other Ambulatory Visit: Payer: Self-pay

## 2024-05-05 VITALS — BP 105/47 | HR 102

## 2024-05-05 VITALS — HR 100

## 2024-05-05 DIAGNOSIS — R2689 Other abnormalities of gait and mobility: Secondary | ICD-10-CM | POA: Insufficient documentation

## 2024-05-05 DIAGNOSIS — N186 End stage renal disease: Secondary | ICD-10-CM | POA: Diagnosis not present

## 2024-05-05 DIAGNOSIS — R278 Other lack of coordination: Secondary | ICD-10-CM | POA: Insufficient documentation

## 2024-05-05 DIAGNOSIS — R2681 Unsteadiness on feet: Secondary | ICD-10-CM

## 2024-05-05 DIAGNOSIS — M3214 Glomerular disease in systemic lupus erythematosus: Secondary | ICD-10-CM | POA: Diagnosis not present

## 2024-05-05 DIAGNOSIS — R29818 Other symptoms and signs involving the nervous system: Secondary | ICD-10-CM | POA: Insufficient documentation

## 2024-05-05 DIAGNOSIS — Z992 Dependence on renal dialysis: Secondary | ICD-10-CM | POA: Insufficient documentation

## 2024-05-05 DIAGNOSIS — R0602 Shortness of breath: Secondary | ICD-10-CM | POA: Insufficient documentation

## 2024-05-05 DIAGNOSIS — R208 Other disturbances of skin sensation: Secondary | ICD-10-CM | POA: Diagnosis present

## 2024-05-05 DIAGNOSIS — M6281 Muscle weakness (generalized): Secondary | ICD-10-CM | POA: Insufficient documentation

## 2024-05-05 NOTE — Therapy (Signed)
 OUTPATIENT PHYSICAL THERAPY NEURO EVALUATION   Patient Name: [name] MRN: [id number] DOB:[birth year], 43 y.o., female Today's Date: 05/05/2024   PCP: Senaida Dama, NP REFERRING PROVIDER: Vada Garibaldi, MD  END OF SESSION:  PT End of Session - 05/05/24 0818     Visit Number 1    Number of Visits 9   8 + eval   Date for PT Re-Evaluation 07/07/24   pushed out due to multi-disciplinary needs   Authorization Type MEDICAID OF [place]    PT Start Time 0802    PT Stop Time 0850    PT Time Calculation (min) 48 min    Equipment Utilized During Treatment Gait belt    Activity Tolerance Patient tolerated treatment well    Behavior During Therapy WFL for tasks assessed/performed             Past Medical History:  Diagnosis Date   Anemia    low iron  - receives iron  at dialysis   Anxiety    Arthritis    RA   Atrial fibrillation with RVR (HCC) 12/14/2023   Chronic systolic congestive heart failure (HCC) 03/16/2016   Dyspnea    ESRD (end stage renal disease) (HCC)    [facility] _ [place]   H/O pericarditis 01/17/2013   H/O pleural effusion 01/17/2013   Heart murmur    Lupus (systemic lupus erythematosus) (HCC)    Previously followed with Dr. 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 pneumonae bacteremia on 01/21/2013. Sensitive to Peniccilin    Past Surgical History:  Procedure Laterality Date   A/V FISTULAGRAM N/A 12/15/2023   Procedure: A/V Fistulagram;  Surgeon: Patrick Boor, MD;   Location: [facility] INVASIVE CV LAB;  Service: Cardiovascular;  Laterality: N/A;   AV FISTULA PLACEMENT     AV FISTULA PLACEMENT Right 08/01/2020   Procedure: RIGHT ARM BRACHIOCEPHALIC  ARTERIOVENOUS (AV) FISTULA CREATION;  Surgeon: Mayo Speck, MD;  Location: [facility] OR;  Service: Vascular;  Laterality: Right;   BASCILIC VEIN TRANSPOSITION Left 02/05/2014   Procedure: BASCILIC VEIN TRANSPOSITION;  Surgeon: Mayo Speck, MD;  Location: [facility] OR;  Service: Vascular;  Laterality: Left;   BASCILIC VEIN TRANSPOSITION Right 01/08/2021   Procedure: RIGHT ARM SECOND STAGE BASCILIC VEIN TRANSPOSITION;  Surgeon: Mayo Speck, MD;  Location: [facility] OR;  Service: Vascular;  Laterality: Right;   BIOPSY  12/17/2021   Procedure: BIOPSY;  Surgeon: Normie Becton., MD;  Location: [facility] ENDOSCOPY;  Service: Gastroenterology;;   ESOPHAGOGASTRODUODENOSCOPY (EGD) WITH PROPOFOL  N/A 12/17/2021   Procedure: ESOPHAGOGASTRODUODENOSCOPY (EGD) WITH PROPOFOL ;  Surgeon: Normie Becton., MD;  Location: [facility] ENDOSCOPY;  Service: Gastroenterology;  Laterality: N/A;   FISTULA SUPERFICIALIZATION Left 05/30/2018   Procedure: FISTULA PLICATION BASILIC VEIN TRANSPOSITION;  Surgeon: Dannis Dy, MD;  Location: [facility] OR;  Service: Vascular;  Laterality: Left;   FISTULA SUPERFICIALIZATION Left 12/18/2019   Procedure: PLICATION OF LEFT ARTERIOVENOUS FISTULA ULCER;  Surgeon: Mayo Speck, MD;  Location: [facility] OR;  Service: Vascular;  Laterality: Left;   I & D EXTREMITY Right 02/18/2021   Procedure: IRRIGATION AND DEBRIDEMENT OF ARM;  Surgeon: Dannis Dy, MD;  Location: [facility] OR;  Service: Vascular;  Laterality: Right;   INSERTION OF DIALYSIS CATHETER N/A 05/19/2020   Procedure: TUNNELED INSERTION  OF DIALYSIS CATHETER;  Surgeon: Adine Hoof, MD;  Location: [facility] OR;  Service: Vascular;  Laterality: N/A;   PERIPHERAL VASCULAR BALLOON ANGIOPLASTY  12/15/2023   Procedure: PERIPHERAL VASCULAR BALLOON ANGIOPLASTY;  Surgeon: Patrick Boor, MD;  Location: [facility] INVASIVE CV LAB;  Service: Cardiovascular;;   THROMBECTOMY AND REVISION OF ARTERIOVENTOUS (AV) GORETEX  GRAFT Left 07/28/2020   Procedure: Oversewing of left arm Brachial cephalic fistula for bleeding.;  Surgeon: Dannis Dy, MD;  Location: [facility] OR;  Service: Vascular;  Laterality: Left;   VENOGRAM Right 01/31/2014   Procedure: DIALYSIS CATHETER;  Surgeon: Margherita Shell, MD;  Location: [facility] CATH LAB;  Service: Cardiovascular;  Laterality: Right;   Patient Active Problem List   Diagnosis Date Noted   SOB (shortness of breath) 04/17/2024   Protein-calorie malnutrition, severe 01/28/2024   Acute respiratory failure (HCC) 01/23/2024   Atrial flutter (HCC) 01/23/2024   Hypotension 01/23/2024   Secondary hypercoagulability disorder (HCC) 01/23/2024   Open thigh wound 01/23/2024   Thrombocytopenia (HCC) 12/14/2023   Atrial fibrillation with RVR (HCC) 12/14/2023   Acute on chronic heart failure (HCC) 12/06/2022   Seizures (HCC) 12/04/2022   R Subdural hematoma (HCC) 12/04/2022   Acute on chronic systolic CHF (congestive heart failure) (HCC) 11/30/2022   Nonrheumatic aortic valve insufficiency 11/30/2022   Nonrheumatic mitral valve regurgitation 11/30/2022   Non-ischemic cardiomyopathy (HCC) 11/30/2022   Acute febrile illness 04/12/2022   Hypertensive urgency 04/12/2022   GERD (gastroesophageal reflux disease) 04/12/2022   HFrEF (heart failure with reduced ejection fraction) (HCC) 04/12/2022   Elevated troponin 04/12/2022   SIRS (systemic inflammatory response syndrome) (HCC) 04/12/2022   Hyperkalemia, diminished renal excretion 12/15/2021   Trichomonas vaginitis 03/25/2021   Bacterial vaginitis 03/25/2021   Encounter for screening for COVID-19 01/24/2021   Hemodialysis access, fistula mature (HCC) 01/22/2021   Allergic reaction 01/22/2021   Acute respiratory failure with hypoxia (HCC) 11/02/2020   Acute hyperkalemia 07/28/2020   Nicotine  dependence,  cigarettes, uncomplicated 07/28/2020   Essential hypertension 07/28/2020   Substance abuse (HCC) 07/28/2020   Hemodialysis catheter malfunction (HCC) 07/28/2020   Complication of AV dialysis fistula, sequela 07/28/2020   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HCC) 05/18/2020   Bacterial intestinal infection, unspecified 12/14/2019   Left shoulder pain 08/15/2019   Cellulitis, unspecified 05/02/2019   Hyperkalemia 07/29/2018   SVT (supraventricular tachycardia) (HCC) 07/16/2018   Cardiomyopathy, unspecified (HCC) 12/03/2016   Diarrhea 04/18/2016   Chronic combined systolic (congestive) and diastolic (congestive) heart failure (HCC) 03/16/2016   Other hypervolemia    S/P thoracentesis    Cough with hemoptysis    Myalgia    Pulmonary edema    ESRD (end stage renal disease) (HCC) 03/03/2016   Dependence on renal dialysis (HCC) 10/15/2015   Rash and nonspecific skin eruption 06/26/2015   Secondary Raynaud's phenomenon 06/24/2015   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   Fever, unspecified 08/28/2014   Pruritus, unspecified 08/28/2014   Headache, unspecified 04/30/2014   Pain, unspecified 04/30/2014   Cramping of hands 04/19/2014   Contraceptive management 04/19/2014   Mild protein-calorie malnutrition (HCC) 03/22/2014   Insomnia 03/15/2014   Benign hypertension with ESRD (end-stage renal disease) (HCC) 03/15/2014   Tobacco abuse 02/15/2014   Healthcare  maintenance 02/15/2014   Iron  deficiency anemia, unspecified 02/12/2014   Coagulation defect, unspecified (HCC) 02/10/2014   Anemia due to chronic kidney disease 02/09/2014   Secondary hyperparathyroidism of renal origin (HCC) 02/09/2014   Hypoalbuminemia 02/01/2014   Nephrotic syndrome 02/01/2014   ESRD on dialysis (HCC) 01/31/2014   Pleural effusion, left 01/31/2014   Microcytic anemia 01/29/2014   Hypocalcemia 01/29/2014   Bacteremia 01/23/2013   Marijuana smoker (HCC) 01/18/2013   H/O pericarditis 01/17/2013   Lupus (systemic lupus  erythematosus) (HCC) 01/17/2013   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   S/P pericardiocentesis 01/17/2013   H/O pleural effusion 01/17/2013   Nephrosis 01/17/2013   Preseptal cellulitis 01/17/2013   Cardiac tamponade 12/29/2004   Pericardial effusion (noninflammatory) 12/29/2004    ONSET DATE: 04/17/2024 (admission date)  REFERRING DIAG: N18.6,Z99.2 (ICD-10-CM) - ESRD on hemodialysis (HCC) R06.02 (ICD-10-CM) - SOB (shortness of breath) M32.14 (ICD-10-CM) - Lupus nephritis (HCC)  THERAPY DIAG:  Muscle weakness (generalized)  Other abnormalities of gait and mobility  Unsteadiness on feet  Rationale for Evaluation and Treatment: Rehabilitation  SUBJECTIVE:                                                                                                                                                                                             SUBJECTIVE STATEMENT: Pt presents alone, ambulatory without AD.  She reports she wants to work on walking up stairs.  She has some baseline dizziness due to her low bp secondary to HD.  She wants to be able to pick things up when she drops it.  She feels weak and exhausted.  She has a wound developing on her sacrum and she has followed up with wound care about R thigh, but is otherwise not worrying about her wounds and may find some cream to go on her bottom from [employer].  She reports not really supplementing her nutrition with protein shakes though she has the doctor recommended ones.  Her blood sugar has been running low recently (50s) though she has no way to check her BG currently. Pt accompanied by: family member (mom - dropped her off)  PERTINENT HISTORY: atrial flutter and A-fib, HFrEF with a EF of 30 to 35% on most recent echo, ESRD on hemodialysis (T/Th/Sat), mitral stenosis/regurgitation, aortic insufficiency  She was admitted 04/17/2024 - 04/21/2024 with discharge diagnosis of volume overload and acute hypoxic respiratory failure.  PAIN:  Are  you having pain? 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 Yes. Number of falls 2- fell off bed due to low blood sugar, frequent stumbling when blood pressure is low  LIVING ENVIRONMENT: Lives with: lives with their family (mother) Lives in: House/apartment (apt) Stairs: Yes: External: 12 steps; none Has following equipment at home: shower chair  PLOF: Independent (takes longer)  PATIENT GOALS: "to be able to go upstairs with less pain"  OBJECTIVE:  Note: Objective measures were completed at Evaluation unless otherwise noted.  VITALS: Today's Vitals   05/05/24 0812  BP: (!) 105/47  Pulse: (!) 102  SpO2: 97%   DIAGNOSTIC FINDINGS:  CT Abdomen and Pelvis 04/17/2024: IMPRESSION: 1. No acute intra-abdominal or pelvic abnormality. 2. Small volume ascites with diffuse mesenteric edema, likely related to the patient's volume status. 3. Reflux of contrast into the hepatic veins, consistent with underlying cardiac dysfunction. Heterogeneous enhancement of the left hepatic lobe and inferior right hepatic tip is also likely related to the cardiac dysfunction (congestive hepatopathy). Correlation with liver enzymes recommended to exclude acute hepatitis.  COGNITION: Overall cognitive status: Within functional limits for tasks assessed   SENSATION: Light touch: WFL  COORDINATION: LE RAMS:  dysmetric BLE Heel-to-shin:  dysmetric  EDEMA:  None significant in BLE during eval, pt reports "it goes straight to the lungs"  MUSCLE TONE: None in BLE  POSTURE: rounded shoulders, forward head, increased thoracic kyphosis, and posterior pelvic  tilt  LOWER EXTREMITY ROM:     Active  Right Eval Left Eval  Hip flexion [facility]  Hip extension " "  Hip abduction " "  Hip adduction " "  Hip internal rotation    Hip external rotation " "  Knee flexion " "  Knee extension " "  Ankle dorsiflexion " "  Ankle plantarflexion " "  Ankle inversion    Ankle eversion     (Blank rows = not tested)  LOWER EXTREMITY MMT:    MMT Right Eval Left Eval  Hip flexion 3+ 3+  Hip extension    Hip abduction 3+ 3+  Hip adduction    Hip internal rotation    Hip external rotation    Knee flexion    Knee extension 4; painful due to wound 4  Ankle dorsiflexion 3+ 3+  Ankle plantarflexion    Ankle inversion    Ankle eversion    (Blank rows = not tested)  BED MOBILITY:  Findings: Sit to supine Complete Independence Supine to sit Complete Independence Rolling to Right Complete Independence Rolling to Left Complete Independence  TRANSFERS: Sit to stand: Complete Independence  Assistive device utilized: None     Stand to sit: Complete Independence  Assistive device utilized: None     Chair to chair: SBA  Assistive device utilized: None       RAMP:  Not tested  CURB:  Not tested  STAIRS: Not tested GAIT: Findings: Gait Characteristics: step through pattern, decreased arm swing- Right, decreased arm swing- Left, decreased stride length, decreased hip/knee flexion- Right, decreased hip/knee flexion- Left, trunk flexed, and narrow BOS, Distance walked: various clinic distances, Assistive device utilized:None, Level of assistance: Complete Independence and SBA, and Comments: Pt ambulates slowly w/ forward lean.  She is mildly dyspneic at end of distance.  FUNCTIONAL TESTS:  5 times sit to stand: 35.47 sec w/ BUE support Timed up and go (TUG): 20.25 sec no AD SBA 2  minute walk test: To be assessed. 10 meter walk test: To be assessed.  PATIENT SURVEYS:  None relevant to chief complaint.                                                                                                                               TREATMENT DATE: 05/05/2024  SpO2 100%, HR 111 bpm at end of session - several attempts to obtain reading.  PATIENT EDUCATION: Education details: PT to contact PCP regarding sacral wound development per pt approval.  Discussed nutritional intake as it relates to energy and safe activity and considering way to monitor BG due to recent low readings at dialysis - please eat/drink protein shake prior to physical activity like PT.  When dizzy on standing/walking find a solid prop if nowhere to immediately sit.  Pt reports being offered hospice and not being open to this or palliative care- PT provides general info on what palliative is and how this could improve quality of life for chronic condition.  Explained PT vs OT.  PT POC, assessments used and to be used, and goals to be set.   Person educated: Patient Education method: Explanation Education comprehension: verbalized understanding and needs further education  HOME EXERCISE PROGRAM: To be established.  GOALS: Goals reviewed with patient? Yes  SHORT TERM GOALS: Target date: 05/02/2024  Pt will be independent and compliant with introductory level strength and balance HEP in order to maintain functional progress and improve mobility. Baseline:  To be established. Goal status: INITIAL  2.  Pt will decrease 5xSTS to </=30.47 seconds in order to demonstrate decreased risk for falls and improved functional bilateral LE strength and power. Baseline: 35.47 sec w/ BUE support Goal status: INITIAL  3.  Pt will demonstrate TUG of </=17.25 seconds in order to decrease risk of falls and improve functional mobility using LRAD. Baseline: 20.25 sec no AD SBA Goal status: INITIAL  4.  to be assessed w/ LTG set as appropriate. Baseline: To be assessed. Goal status: INITIAL  5.  to be assessed w/ goal set as appropriate. Baseline: To be assessed. Goal status:  INITIAL  6.  Pt will verbalize appropriate nutritional intake to supplement physical activity safely. Baseline: recent BG in 50s due to little to no nutrient intake per report Goal status: INITIAL  LONG TERM GOALS: Target date: 06/30/2024  Pt will be independent and compliant with strength, balance, and light cardio focused HEP in order to maintain functional progress and improve mobility. Baseline:  To be established. Goal status: INITIAL  2.  Pt will decrease 5xSTS to </=25.47 seconds in order to demonstrate decreased risk for falls and improved functional bilateral LE strength and power. Baseline: 35.47 sec w/ BUE support Goal status: INITIAL  3.  Pt will demonstrate TUG of </=14.25 seconds in order to decrease risk of falls and improve functional mobility using LRAD. Baseline: 20.25 sec no AD SBA Goal status: INITIAL  4.  to be assessed w/ LTG set as appropriate. Baseline: To be assessed. Goal status: INITIAL  5.  Pt will negotiate 12 stairs w/o rail continuously w/o LOB or increased pain rating from baseline in order to improve home management. Baseline: discontinuously and impacted by severe wound pain Goal status: INITIAL  6.  to be assessed w/ goal set as appropriate. Baseline: To be assessed. Goal status: INITIAL  ASSESSMENT:  CLINICAL IMPRESSION: Patient is a 43 y.o. female who was seen today for physical therapy evaluation and treatment for recent hospitalization due to acute volume overload and hypoxic respiratory failure.  Pt has a significant PMH of atrial flutter and A-fib, HFrEF with a EF of 30 to 35% on most recent echo, ESRD on hemodialysis (T/Th/Sat), mitral stenosis/regurgitation, and aortic insufficiency.  Identified impairments include BLE weakness, BG/BP fluctuations, decreased gait speed, kyphotic posture, wound development on left arm, sacrum, and right thigh, difficulty with stair management (secondary to wound pain), dyspnea on exertion, swelling  of left index finger, and dizziness at baseline (likely due to chronically low BP).  Evaluation via the following assessment tools: 5xSTS and TUG coupled with recent history of BP/BG related falls and general debility indicates high fall risk.  Her SpO2 remained 97-100% throughout session on RA.  She is pursuing home O2 options w/ PCP at upcoming visit.  She would benefit from skilled PT to address impairments as noted and progress towards long term goals.  OBJECTIVE IMPAIRMENTS: Abnormal gait, decreased activity tolerance, decreased balance, decreased coordination, decreased endurance, decreased knowledge of condition, decreased knowledge of use of DME, difficulty walking, decreased strength, decreased safety awareness, dizziness, increased edema, impaired UE functional use, improper body mechanics, postural dysfunction, and pain.   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   REHAB POTENTIAL: Poor see PMH and personal factors  CLINICAL DECISION MAKING: Unstable/unpredictable  EVALUATION COMPLEXITY: High  PLAN:  PT FREQUENCY: 1x/week  PT DURATION: 8 weeks  PLANNED INTERVENTIONS: 97164- PT Re-evaluation, 97750- Physical Performance Testing, 97110-Therapeutic exercises, 97530- Therapeutic activity, [id number]- Neuromuscular re-education, 97535- Self Care, [id number]- Manual therapy, [phone]- Gait training, Patient/Family education, Balance training, Stair training, Vestibular training, and DME instructions  PLAN FOR NEXT SESSION: Energy conservation. How is  sacral/other wounds?  Initial HEP for bed level, sitting and standing balance - reaching, light cardio in sitting; check L BP!  ASSESS and - set goals as appropriate.  Stair management w/o rail - would she benefit from cane?  Check all possible CPT codes: See Planned Interventions List for Planned CPT Codes    Check all conditions that are expected to impact treatment: Ongoing dialysis or cancer treatment, Social determinants of health, and Active major medical illness   If treatment provided at initial evaluation, no treatment charged due to lack of authorization.   Earlean Glaze, PT, DPT 05/05/2024, 9:27 AM

## 2024-05-05 NOTE — Therapy (Signed)
 OUTPATIENT OCCUPATIONAL THERAPY NEURO EVALUATION  Patient Name: [name] MRN: [id number] DOB:Mar 28, 1981, 43 y.o., female Today's Date: 05/05/2024  PCP: Senaida Dama, NP  REFERRING PROVIDER: Vada Garibaldi, MD   END OF SESSION:  OT End of Session - 05/05/24 1512     Visit Number 1    Number of Visits 9   including eval   Date for OT Re-Evaluation 06/30/24    Authorization Type Medicaid, $4 copay, Auth Reqd, VL: 27    OT Start Time 0855    OT Stop Time 0928    OT Time Calculation (min) 33 min    Activity Tolerance Patient tolerated treatment well;Patient limited by fatigue    Behavior During Therapy [facility] for tasks assessed/performed             Past Medical History:  Diagnosis Date   Anemia    low iron  - receives iron  at dialysis   Anxiety    Arthritis    RA   Atrial fibrillation with RVR (HCC) 12/14/2023   Chronic systolic congestive heart failure (HCC) 03/16/2016   Dyspnea    ESRD (end stage renal disease) (HCC)    [facility] _ [place]   H/O pericarditis 01/17/2013   H/O pleural effusion 01/17/2013   Heart murmur    Lupus (systemic lupus erythematosus) (HCC)    Previously followed with Dr. 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 pneumonae bacteremia on 01/21/2013. Sensitive to Peniccilin    Past Surgical History:  Procedure Laterality Date   A/V FISTULAGRAM N/A 12/15/2023   Procedure: A/V Fistulagram;  Surgeon: Patrick Boor, MD;  Location: [facility] INVASIVE CV LAB;   Service: Cardiovascular;  Laterality: N/A;   AV FISTULA PLACEMENT     AV FISTULA PLACEMENT Right 08/01/2020   Procedure: RIGHT ARM BRACHIOCEPHALIC  ARTERIOVENOUS (AV) FISTULA CREATION;  Surgeon: Mayo Speck, MD;  Location: [facility] OR;  Service: Vascular;  Laterality: Right;   BASCILIC VEIN TRANSPOSITION Left 02/05/2014   Procedure: BASCILIC VEIN TRANSPOSITION;  Surgeon: Mayo Speck, MD;  Location: [facility] OR;  Service: Vascular;  Laterality: Left;   BASCILIC VEIN TRANSPOSITION Right 01/08/2021   Procedure: RIGHT ARM SECOND STAGE BASCILIC VEIN TRANSPOSITION;  Surgeon: Mayo Speck, MD;  Location: [facility] OR;  Service: Vascular;  Laterality: Right;   BIOPSY  12/17/2021   Procedure: BIOPSY;  Surgeon: Normie Becton., MD;  Location: [facility] ENDOSCOPY;  Service: Gastroenterology;;   ESOPHAGOGASTRODUODENOSCOPY (EGD) WITH PROPOFOL  N/A 12/17/2021   Procedure: ESOPHAGOGASTRODUODENOSCOPY (EGD) WITH PROPOFOL ;  Surgeon: Normie Becton., MD;  Location: [facility] ENDOSCOPY;  Service: Gastroenterology;  Laterality: N/A;   FISTULA SUPERFICIALIZATION Left 05/30/2018   Procedure: FISTULA PLICATION BASILIC VEIN TRANSPOSITION;  Surgeon: Dannis Dy, MD;  Location: [facility] OR;  Service: Vascular;  Laterality: Left;   FISTULA SUPERFICIALIZATION Left 12/18/2019   Procedure: PLICATION OF LEFT ARTERIOVENOUS FISTULA ULCER;  Surgeon: Mayo Speck, MD;  Location: [facility] OR;  Service: Vascular;  Laterality: Left;   I & D  EXTREMITY Right 02/18/2021   Procedure: IRRIGATION AND DEBRIDEMENT OF ARM;  Surgeon: Dannis Dy, MD;  Location: [facility] OR;  Service: Vascular;  Laterality: Right;   INSERTION OF DIALYSIS CATHETER N/A 05/19/2020   Procedure: TUNNELED INSERTION  OF DIALYSIS CATHETER;  Surgeon: Adine Hoof, MD;  Location: [facility] OR;  Service: Vascular;  Laterality: N/A;   PERIPHERAL VASCULAR BALLOON ANGIOPLASTY  12/15/2023   Procedure: PERIPHERAL VASCULAR BALLOON ANGIOPLASTY;  Surgeon: Patrick Boor, MD;  Location: [facility]  INVASIVE CV LAB;  Service: Cardiovascular;;   THROMBECTOMY AND REVISION OF ARTERIOVENTOUS (AV) GORETEX  GRAFT Left 07/28/2020   Procedure: Oversewing of left arm Brachial cephalic fistula for bleeding.;  Surgeon: Dannis Dy, MD;  Location: [facility] OR;  Service: Vascular;  Laterality: Left;   VENOGRAM Right 01/31/2014   Procedure: DIALYSIS CATHETER;  Surgeon: Margherita Shell, MD;  Location: [facility] CATH LAB;  Service: Cardiovascular;  Laterality: Right;   Patient Active Problem List   Diagnosis Date Noted   SOB (shortness of breath) 04/17/2024   Protein-calorie malnutrition, severe 01/28/2024   Acute respiratory failure (HCC) 01/23/2024   Atrial flutter (HCC) 01/23/2024   Hypotension 01/23/2024   Secondary hypercoagulability disorder (HCC) 01/23/2024   Open thigh wound 01/23/2024   Thrombocytopenia (HCC) 12/14/2023   Atrial fibrillation with RVR (HCC) 12/14/2023   Acute on chronic heart failure (HCC) 12/06/2022   Seizures (HCC) 12/04/2022   R Subdural hematoma (HCC) 12/04/2022   Acute on chronic systolic CHF (congestive heart failure) (HCC) 11/30/2022   Nonrheumatic aortic valve insufficiency 11/30/2022   Nonrheumatic mitral valve regurgitation 11/30/2022   Non-ischemic cardiomyopathy (HCC) 11/30/2022   Acute febrile illness 04/12/2022   Hypertensive urgency 04/12/2022   GERD (gastroesophageal reflux disease) 04/12/2022   HFrEF (heart failure with reduced ejection fraction) (HCC) 04/12/2022   Elevated troponin 04/12/2022   SIRS (systemic inflammatory response syndrome) (HCC) 04/12/2022   Hyperkalemia, diminished renal excretion 12/15/2021   Trichomonas vaginitis 03/25/2021   Bacterial vaginitis 03/25/2021   Encounter for screening for COVID-19 01/24/2021   Hemodialysis access, fistula mature (HCC) 01/22/2021   Allergic reaction 01/22/2021   Acute respiratory failure with hypoxia (HCC) 11/02/2020   Acute hyperkalemia 07/28/2020   Nicotine  dependence, cigarettes, uncomplicated  07/28/2020   Essential hypertension 07/28/2020   Substance abuse (HCC) 07/28/2020   Hemodialysis catheter malfunction (HCC) 07/28/2020   Complication of AV dialysis fistula, sequela 07/28/2020   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HCC) 05/18/2020   Bacterial intestinal infection, unspecified 12/14/2019   Left shoulder pain 08/15/2019   Cellulitis, unspecified 05/02/2019   Hyperkalemia 07/29/2018   SVT (supraventricular tachycardia) (HCC) 07/16/2018   Cardiomyopathy, unspecified (HCC) 12/03/2016   Diarrhea 04/18/2016   Chronic combined systolic (congestive) and diastolic (congestive) heart failure (HCC) 03/16/2016   Other hypervolemia    S/P thoracentesis    Cough with hemoptysis    Myalgia    Pulmonary edema    ESRD (end stage renal disease) (HCC) 03/03/2016   Dependence on renal dialysis (HCC) 10/15/2015   Rash and nonspecific skin eruption 06/26/2015   Secondary Raynaud's phenomenon 06/24/2015   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   Fever, unspecified 08/28/2014   Pruritus, unspecified 08/28/2014   Headache, unspecified 04/30/2014   Pain, unspecified 04/30/2014   Cramping of hands 04/19/2014   Contraceptive management 04/19/2014   Mild protein-calorie malnutrition (HCC) 03/22/2014   Insomnia 03/15/2014   Benign hypertension with ESRD (end-stage renal disease) (HCC) 03/15/2014   Tobacco abuse 02/15/2014   Healthcare maintenance 02/15/2014   Iron  deficiency anemia, unspecified 02/12/2014  Coagulation defect, unspecified (HCC) 02/10/2014   Anemia due to chronic kidney disease 02/09/2014   Secondary hyperparathyroidism of renal origin (HCC) 02/09/2014   Hypoalbuminemia 02/01/2014   Nephrotic syndrome 02/01/2014   ESRD on dialysis (HCC) 01/31/2014   Pleural effusion, left 01/31/2014   Microcytic anemia 01/29/2014   Hypocalcemia 01/29/2014   Bacteremia 01/23/2013   Marijuana smoker (HCC) 01/18/2013   H/O pericarditis 01/17/2013   Lupus (systemic lupus erythematosus) (HCC) 01/17/2013    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   S/P pericardiocentesis 01/17/2013   H/O pleural effusion 01/17/2013   Nephrosis 01/17/2013   Preseptal cellulitis 01/17/2013   Cardiac tamponade 12/29/2004   Pericardial effusion (noninflammatory) 12/29/2004    ONSET DATE: 04/20/2024 (referral date)  REFERRING DIAG:  N18.6,Z99.2 (ICD-10-CM) - ESRD on hemodialysis (HCC)  R06.02 (ICD-10-CM) - SOB (shortness of breath)  M32.14 (ICD-10-CM) - Lupus nephritis (HCC)    THERAPY DIAG:  Other lack of coordination  Muscle weakness (generalized)  Other disturbances of skin sensation  Other symptoms and signs involving the nervous system  Rationale for Evaluation and Treatment: Rehabilitation  SUBJECTIVE:   SUBJECTIVE STATEMENT: PT provided update on pt's vitals (see PT note for additional details): low BP, elevated HR, spO2 WFL.   Pt reported BP tends to run low and HR tends to run high. Pt reported moderate dizziness at baseline and a lot of fatigue. Pt reported R hand is swollen "but other than that it's fine." Pt reported difficulty with going up/down steps for stairs. Pt reported not having trouble with things but everything taking longer. Pt reported time consuming to do everything.  Pt accompanied by: self (pt's mother dropped off)  PERTINENT HISTORY: atrial flutter and A-fib, HFrEF with a EF of 30 to 35% on most recent echo, ESRD on hemodialysis (T/Th/Sat), mitral stenosis/regurgitation, aortic insufficiency   She was admitted 04/17/2024 - 04/21/2024 with discharge diagnosis of volume overload and acute hypoxic respiratory failure.  Per 04/21/24 MD D/C Summary: "atrial flutter and A-fib, HFrEF with a EF of 30 to 35% on most recent echo, end-stage renal disease on hemodialysis Tuesday Thursday and Saturday schedule, valvular heart disease with mitral stenosis and regurg, aortic insufficiency, admitted with shortness of breath... Very high risk of readmission given advanced cardiovascular disease and  dialysis. Today she is stable to discharge home... severe malnutrition"    PRECAUTIONS: Fall, pt on hemodialysis (Tuesday, Thursday, Saturday), per pt: avoid lifting heavy objects  WEIGHT BEARING RESTRICTIONS: No  PAIN:  Are you having pain? 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 Yes. Number of falls Pt reported sugar and BP drops while sleeping and has fallen 2x out of bed. Pt reported blood sugar has been 55 at night, pt reported this was normal. Pt had a near fall during dialysis 1x though staff assisted pt.  LIVING ENVIRONMENT: Lives with: lives with their family Lives in: House/apartment Stairs: Yes: Internal: 15 steps; can reach both Has following equipment at home: sock aid, Crutches and shower chair (pt reported has not opened and used shower chair)  PLOF: modI with extra time, low activity tolerance  PATIENT GOALS: "I have no primary goal for you guys, other than getting down the stairs. Everything is more time consuming" and pt agreed a good goal would to be more efficient with  tasks.   OBJECTIVE:  Note: Objective measures were completed at Evaluation unless otherwise noted.  HAND DOMINANCE: Right  ADLs: Overall ADLs:  Transfers/ambulation related to ADLs: Eating: ind Grooming: ind, difficulty maintaining arms in overhead positioning during hair care UB Dressing: ind, wears loose clothes, "a little struggle" LB Dressing: slip-on shoes, ind socks, wears loose and baggy pants Toileting: difficult getting down to the toilet "really low", not as much difficulty getting up.  Bathing: unable to shower d/t presence of wounds on R thigh and L wrist, pt currently sponge bathing with minA to setupA Equipment:  Shower seat with back (not currently using d/t presence of wounds)  IADLs: Shopping: family assistance, door dash delivery Light housekeeping: family assistance Meal Prep: Pt loves to cook, pt uses crockpot, stove, oven, grill. Pt reported sometimes feeling winded during cooking. Alternates between sitting and standing. Community mobility: family assistance  Handwriting: difficulty. "terrible" when signing name per pt.  MOBILITY STATUS: Independent  POSTURE COMMENTS:  rounded shoulders and forward head Sitting balance: ind  ACTIVITY TOLERANCE: Activity tolerance: gets winded during extended activity, fatigues easily during daily tasks. "I'm always tired."  FUNCTIONAL OUTCOME MEASURES: PSFS: 2.0    Total score = sum of the activity scores/number of activities Minimum detectable change (90%CI) for average score = 2 points Minimum detectable change (90%CI) for single activity score = 3 points   UPPER EXTREMITY ROM:    Active ROM Right eval Left eval  Shoulder flexion Approx. 100* Approx. 100*  Shoulder abduction    Shoulder adduction    Shoulder extension    Shoulder internal rotation    Shoulder external rotation    Elbow flexion [facility] WFL  Elbow extension    Wrist flexion    Wrist extension    Wrist ulnar deviation    Wrist radial deviation    Wrist pronation    Wrist supination    (Blank rows = not tested)  R index finger flex - impaired, swelling noted.   HAND FUNCTION: Grip strength: Right: 19.6 lbs; Left: 22.0 lbs  COORDINATION: 9 Hole Peg test: Right: 37 sec with x2 drops; Left: 32 sec  RUE: pt using middle finger and thumb for picking up pegs secondary to limited ROM of index finger.  LUE: using 3-point pinch and 4-point pinch and key pinch.  Pt reported frequently dropping objects and has difficulty picking up objects from floor. When bending at waist, pt experiences some back pain.   SENSATION: Pt denied numbness and tingling currently though  reported numbness "it happens a lot when they [fingers] turn blue" (e.g. at dialysis). Pt reported numbness and blue-toned skin improves when using heat packs or warm water . Pt demo'd blue-tone to R index finger today and pt reported doctor has said "poor circulation." Pt reported blue-tone of other fingers often happens in morning, during dialysis, and when picking up something cold.  EDEMA: Index finger of R hand - moderate edema  MUSCLE TONE: RUE: Within functional limits and LUE: Within functional limits  COGNITION: Overall cognitive status: Within functional limits for tasks assessed  VISION: Subjective report: Pt reported sometimes blurry vision at night. Pt reported not having eyes checked recently.  VISION ASSESSMENT: Not tested  PERCEPTION: Not tested  PRAXIS: Not tested  OBSERVATIONS: Pt ambulated ind without A/E with unsteady gait. Pt demo'd forward head posture and appeared fatigued. Pt reported wound on R medial thigh which has improved from "big as my hand" to "about the size of a quarter." Pt reported uncertain  of returning to wound care d/t previous negative experience with wound care. Pt reported following up with doctor. Pt had wound on L wrist covered by bandage. Pt monitoring the wound and reported fluid under the surface from skin "I'm trying to keep it from busting."                                                                                                                           TREATMENT DATE:    Self-Care OT educated pt on OT role, POC, general healthcare management (e.g. Monitoring blood sugar with monitor, pt reported recently ordering), A/E options of raised commode and reacher for ADL tasks.   Vital signs assessed at beginning of session: spO2 WFL at beginning of session. OT unable to obtain spO2 reading at end of session on any digits of B hands. Pt reported this is common. OT questioning ?poor circulation to distal fingertips.    PATIENT  EDUCATION: Education details: see today's tx above Person educated: Patient Education method: Explanation Education comprehension: verbalized understanding  HOME EXERCISE PROGRAM: TBD   GOALS: Goals reviewed with patient? Yes  SHORT TERM GOALS: Target date: 06/02/24  Pt will return demo of HEP with visual handouts. Baseline: new to outpt OT Goal status: INITIAL  2.  Pt will verbalize understanding of A/E options and task modifications as needed to improve safety and efficiency for I/ADLs.  Baseline: modI though I/ADLs time-consuming. Pt often fatigued and easily winded during tasks. Provided education regarding reacher, raised commode at eval. Goal status: INITIAL  3.  Pt will ind recall at least 3 energy conservation strategies. Baseline: pt reported often fatigued and easily winded during tasks Goal status: INITIAL  4.  Pt will return demo of edema management strategies (e.g. Retrograde massage, compression, etc. Note: No ice d/t pt reported cold-temperature items may cause blue-tone to skin of fingers).  Baseline: moderate edema of R index finger Goal status: INITIAL   LONG TERM GOALS: Target date: 06/30/24  Pt will write name with at least 90% legibility as needed to fill out medical and other forms. Baseline: difficulty with handwriting Goal status: INITIAL  2.  Patient will maintain FM coordination 9-hole peg test scores. Baseline: 9 Hole Peg test: Right: 37 sec with x2 drops; Left: 32 sec Goal status: INITIAL  3.  Patient will maintain BUE grip strength as needed to open jars and other containers.  Baseline: Grip strength: Right: 19.6 lbs; Left: 22.0 lbs Goal status: INITIAL  4.  Patient will report at least two-point increase in average PSFS score or at least three-point increase in a single activity score indicating functionally significant improvement given minimum detectable change using adaptive equipment and strategies PRN. Baseline: PSFS: 2.0 total score (See  above for individual activity scores)  Goal status: INITIAL  ASSESSMENT:  CLINICAL IMPRESSION: Patient is a 43 y.o. female who was seen today for occupational therapy evaluation for ESRD on hemodialysis, SOB, Lupus nephritis. Pt presents with complex medical  hx and ongoing/evolving medical status, including but not limited to atrial flutter and A-fib, HFrEF with a EF of 30 to 35% on most recent echo, ESRD on hemodialysis (T/Th/Sat), mitral stenosis/regurgitation, aortic insufficiency, admitted 04/17/2024 - 04/21/2024 with discharge diagnosis of volume overload and acute hypoxic respiratory failure, severe malnutrition. Patient currently presents at significantly low level of baseline functioning demonstrating functional deficits and impairments as noted below. Pt would benefit from skilled OT services in the outpatient setting to work on impairments as noted below to improve safety and efficiency of I/ADLs and prevent significant decline.   PERFORMANCE DEFICITS: in functional skills including ADLs, IADLs, coordination, dexterity, proprioception, sensation, edema, ROM, strength, pain, flexibility, Fine motor control, Gross motor control, mobility, balance, body mechanics, endurance, cardiopulmonary status limiting function, decreased knowledge of use of DME, wound, skin integrity, and UE functional use, cognitive skills including attention, energy/drive, problem solving, and safety awareness, and psychosocial skills including environmental adaptation.   IMPAIRMENTS: are limiting patient from ADLs, IADLs, rest and sleep, leisure, and social participation.   CO-MORBIDITIES: has co-morbidities such as (including but not limited to) cardiovascular disease, end-stage renal disease on hemodialysis, severe malnutrition, lupus nephritis, acute hypoxic respiratory failure that affects occupational performance. Patient will benefit from skilled OT to address above impairments and improve overall  function.  MODIFICATION OR ASSISTANCE TO COMPLETE EVALUATION: Maximum or significant modification of tasks or assist is necessary to complete an evaluation.  OT OCCUPATIONAL PROFILE AND HISTORY: Detailed assessment: Review of records and additional review of physical, cognitive, psychosocial history related to current functional performance.  CLINICAL DECISION MAKING: High - multiple treatment options, significant modification of task necessary  REHAB POTENTIAL: Fair d/t complex medical hx and ongoing/evolving medical status  EVALUATION COMPLEXITY: High    PLAN:  OT FREQUENCY: 1x/week  OT DURATION: 8 weeks  PLANNED INTERVENTIONS: 97168 OT Re-evaluation, 97535 self care/ADL training, 82956 therapeutic exercise, 97530 therapeutic activity, 97112 neuromuscular re-education, 97140 manual therapy, 97035 ultrasound, 97018 paraffin, 21308 fluidotherapy, 97010 moist heat, 97010 cryotherapy, 97760 Orthotic Initial, 97761 Prosthetic Initial, [id number] Orthotic/Prosthetic subsequent, passive range of motion, functional mobility training, compression bandaging, visual/perceptual remediation/compensation, energy conservation, patient/family education, and DME and/or AE instructions  RECOMMENDED OTHER SERVICES: PT eval completed.  CONSULTED AND AGREED WITH PLAN OF CARE: Patient  PLAN FOR NEXT SESSION:  Monitor vitals (BP tends to run low, HR tends to be elevated)  Energy conservation  Handwriting  Edema management strategies of R index finger and other digits - Pt will return demo of edema management strategies (e.g. Retrograde massage, compression, etc. Note: No ice d/t pt reported cold-temperature items may cause blue-tone to skin of fingers).   A/E and adaptive strategies for ADLs to improve efficiency - reacher, raised commode, etc. - show examples  Discuss general health management - blood sugar monitoring  HEP: Theraputty and Coordination (limit # of exercises to avoid excessive  fatigue)    For all possible CPT codes, reference the Planned Interventions line above.     Check all conditions that are expected to impact treatment: {Conditions expected to impact treatment:Respiratory disorders, Ongoing dialysis or cancer treatment, Social determinants of health, and Active major medical illness   If treatment provided at initial evaluation, no treatment charged due to lack of authorization.        Oakley Bellman, OT 05/05/2024, 4:02 PM

## 2024-05-07 ENCOUNTER — Encounter: Payer: Self-pay | Admitting: Physician Assistant

## 2024-05-07 ENCOUNTER — Telehealth: Admitting: Physician Assistant

## 2024-05-07 DIAGNOSIS — J069 Acute upper respiratory infection, unspecified: Secondary | ICD-10-CM | POA: Diagnosis not present

## 2024-05-07 MED ORDER — FLUTICASONE PROPIONATE 50 MCG/ACT NA SUSP
2.0000 | Freq: Every day | NASAL | 0 refills | Status: DC
Start: 2024-05-07 — End: 2024-05-12

## 2024-05-07 MED ORDER — BENZONATATE 100 MG PO CAPS: ORAL_CAPSULE | ORAL | 0 refills | Status: DC

## 2024-05-07 NOTE — Progress Notes (Signed)

## 2024-05-08 ENCOUNTER — Inpatient Hospital Stay (HOSPITAL_COMMUNITY)
Admission: EM | Admit: 2024-05-08 | Discharge: 2024-05-12 | DRG: 682 | Disposition: A | Discharge status: Home / Self Care | Attending: Internal Medicine | Admitting: Internal Medicine

## 2024-05-08 ENCOUNTER — Emergency Department (HOSPITAL_COMMUNITY)

## 2024-05-08 ENCOUNTER — Other Ambulatory Visit: Payer: Self-pay

## 2024-05-08 ENCOUNTER — Encounter (HOSPITAL_COMMUNITY): Payer: Self-pay

## 2024-05-08 DIAGNOSIS — E8770 Fluid overload, unspecified: Secondary | ICD-10-CM | POA: Diagnosis present

## 2024-05-08 DIAGNOSIS — M329 Systemic lupus erythematosus, unspecified: Secondary | ICD-10-CM | POA: Diagnosis present

## 2024-05-08 DIAGNOSIS — D631 Anemia in chronic kidney disease: Secondary | ICD-10-CM | POA: Diagnosis present

## 2024-05-08 DIAGNOSIS — R54 Age-related physical debility: Secondary | ICD-10-CM | POA: Diagnosis present

## 2024-05-08 DIAGNOSIS — I4892 Unspecified atrial flutter: Secondary | ICD-10-CM | POA: Diagnosis present

## 2024-05-08 DIAGNOSIS — Z992 Dependence on renal dialysis: Secondary | ICD-10-CM

## 2024-05-08 DIAGNOSIS — F419 Anxiety disorder, unspecified: Secondary | ICD-10-CM | POA: Diagnosis present

## 2024-05-08 DIAGNOSIS — Z681 Body mass index (BMI) 19 or less, adult: Secondary | ICD-10-CM

## 2024-05-08 DIAGNOSIS — I9589 Other hypotension: Secondary | ICD-10-CM | POA: Diagnosis present

## 2024-05-08 DIAGNOSIS — E872 Acidosis, unspecified: Secondary | ICD-10-CM | POA: Diagnosis present

## 2024-05-08 DIAGNOSIS — J9601 Acute respiratory failure with hypoxia: Secondary | ICD-10-CM | POA: Diagnosis not present

## 2024-05-08 DIAGNOSIS — N2581 Secondary hyperparathyroidism of renal origin: Secondary | ICD-10-CM | POA: Diagnosis present

## 2024-05-08 DIAGNOSIS — Z79899 Other long term (current) drug therapy: Secondary | ICD-10-CM

## 2024-05-08 DIAGNOSIS — N186 End stage renal disease: Principal | ICD-10-CM | POA: Diagnosis present

## 2024-05-08 DIAGNOSIS — F17210 Nicotine dependence, cigarettes, uncomplicated: Secondary | ICD-10-CM | POA: Diagnosis present

## 2024-05-08 DIAGNOSIS — E43 Unspecified severe protein-calorie malnutrition: Secondary | ICD-10-CM | POA: Diagnosis present

## 2024-05-08 DIAGNOSIS — E162 Hypoglycemia, unspecified: Secondary | ICD-10-CM | POA: Diagnosis not present

## 2024-05-08 DIAGNOSIS — R64 Cachexia: Secondary | ICD-10-CM | POA: Diagnosis present

## 2024-05-08 DIAGNOSIS — Z881 Allergy status to other antibiotic agents status: Secondary | ICD-10-CM

## 2024-05-08 DIAGNOSIS — I5022 Chronic systolic (congestive) heart failure: Secondary | ICD-10-CM | POA: Diagnosis present

## 2024-05-08 DIAGNOSIS — I4891 Unspecified atrial fibrillation: Secondary | ICD-10-CM | POA: Diagnosis present

## 2024-05-08 LAB — RENAL FUNCTION PANEL
Albumin: 2.9 g/dL — ABNORMAL LOW (ref 3.5–5.0)
Anion gap: 19 — ABNORMAL HIGH (ref 5–15)
BUN: 28 mg/dL — ABNORMAL HIGH (ref 6–20)
CO2: 25 mmol/L (ref 22–32)
Calcium: 8.6 mg/dL — ABNORMAL LOW (ref 8.9–10.3)
Chloride: 91 mmol/L — ABNORMAL LOW (ref 98–111)
Creatinine, Ser: 3.31 mg/dL — ABNORMAL HIGH (ref 0.44–1.00)
GFR, Estimated: 17 mL/min — ABNORMAL LOW (ref >60)
Glucose, Bld: 48 mg/dL — ABNORMAL LOW (ref 70–99)
Phosphorus: 4.2 mg/dL (ref 2.5–4.6)
Potassium: 4.5 mmol/L (ref 3.5–5.1)
Sodium: 135 mmol/L (ref 135–145)

## 2024-05-08 LAB — I-STAT VENOUS BLOOD GAS, ED
Acid-base deficit: 2 mmol/L (ref 0.0–2.0)
Bicarbonate: 23.6 mmol/L (ref 20.0–28.0)
Calcium, Ion: 0.94 mmol/L — ABNORMAL LOW (ref 1.15–1.40)
HCT: 37 % (ref 36.0–46.0)
Hemoglobin: 12.6 g/dL (ref 12.0–15.0)
O2 Saturation: 79 %
Potassium: 4.9 mmol/L (ref 3.5–5.1)
Sodium: 136 mmol/L (ref 135–145)
TCO2: 25 mmol/L (ref 22–32)
pCO2, Ven: 41.3 mmHg — ABNORMAL LOW (ref 44–60)
pH, Ven: 7.365 (ref 7.25–7.43)
pO2, Ven: 45 mmHg (ref 32–45)

## 2024-05-08 LAB — CBC WITH DIFFERENTIAL/PLATELET
Abs Immature Granulocytes: 0 10*3/uL (ref 0.00–0.07)
Basophils Absolute: 0 10*3/uL (ref 0.0–0.1)
Basophils Relative: 0 %
Eosinophils Absolute: 0.1 10*3/uL (ref 0.0–0.5)
Eosinophils Relative: 1 %
HCT: 39.4 % (ref 36.0–46.0)
Hemoglobin: 12.3 g/dL (ref 12.0–15.0)
Lymphocytes Relative: 18 %
Lymphs Abs: 1.1 10*3/uL (ref 0.7–4.0)
MCH: 28.4 pg (ref 26.0–34.0)
MCHC: 31.2 g/dL (ref 30.0–36.0)
MCV: 91 fL (ref 80.0–100.0)
Monocytes Absolute: 0.6 10*3/uL (ref 0.1–1.0)
Monocytes Relative: 9 %
Neutro Abs: 4.5 10*3/uL (ref 1.7–7.7)
Neutrophils Relative %: 72 %
Platelets: 186 10*3/uL (ref 150–400)
RBC: 4.33 MIL/uL (ref 3.87–5.11)
RDW: 19.9 % — ABNORMAL HIGH (ref 11.5–15.5)
WBC: 6.2 10*3/uL (ref 4.0–10.5)
nRBC: 3.2 % — ABNORMAL HIGH (ref 0.0–0.2)
nRBC: 8 /100{WBCs} — ABNORMAL HIGH

## 2024-05-08 LAB — CBC
HCT: 40.3 % (ref 36.0–46.0)
Hemoglobin: 12.9 g/dL (ref 12.0–15.0)
MCH: 28.2 pg (ref 26.0–34.0)
MCHC: 32 g/dL (ref 30.0–36.0)
MCV: 88.2 fL (ref 80.0–100.0)
Platelets: 238 10*3/uL (ref 150–400)
RBC: 4.57 MIL/uL (ref 3.87–5.11)
RDW: 20.1 % — ABNORMAL HIGH (ref 11.5–15.5)
WBC: 6.6 10*3/uL (ref 4.0–10.5)
nRBC: 3.2 % — ABNORMAL HIGH (ref 0.0–0.2)

## 2024-05-08 LAB — I-STAT CHEM 8, ED
BUN: 51 mg/dL — ABNORMAL HIGH (ref 6–20)
Calcium, Ion: 0.92 mmol/L — ABNORMAL LOW (ref 1.15–1.40)
Chloride: 99 mmol/L (ref 98–111)
Creatinine, Ser: 5.4 mg/dL — ABNORMAL HIGH (ref 0.44–1.00)
Glucose, Bld: 65 mg/dL — ABNORMAL LOW (ref 70–99)
HCT: 38 % (ref 36.0–46.0)
Hemoglobin: 12.9 g/dL (ref 12.0–15.0)
Potassium: 4.9 mmol/L (ref 3.5–5.1)
Sodium: 136 mmol/L (ref 135–145)
TCO2: 24 mmol/L (ref 22–32)

## 2024-05-08 LAB — COMPREHENSIVE METABOLIC PANEL WITH GFR
ALT: 19 U/L (ref 0–44)
AST: 32 U/L (ref 15–41)
Albumin: 2.7 g/dL — ABNORMAL LOW (ref 3.5–5.0)
Alkaline Phosphatase: 265 U/L — ABNORMAL HIGH (ref 38–126)
Anion gap: 29 — ABNORMAL HIGH (ref 5–15)
BUN: 50 mg/dL — ABNORMAL HIGH (ref 6–20)
CO2: 16 mmol/L — ABNORMAL LOW (ref 22–32)
Calcium: 8.1 mg/dL — ABNORMAL LOW (ref 8.9–10.3)
Chloride: 93 mmol/L — ABNORMAL LOW (ref 98–111)
Creatinine, Ser: 5.44 mg/dL — ABNORMAL HIGH (ref 0.44–1.00)
GFR, Estimated: 10 mL/min — ABNORMAL LOW (ref >60)
Glucose, Bld: 70 mg/dL (ref 70–99)
Potassium: 5.1 mmol/L (ref 3.5–5.1)
Sodium: 138 mmol/L (ref 135–145)
Total Bilirubin: 1.2 mg/dL (ref 0.0–1.2)
Total Protein: 7.8 g/dL (ref 6.5–8.1)

## 2024-05-08 LAB — CBG MONITORING, ED
Glucose-Capillary: 67 mg/dL — ABNORMAL LOW (ref 70–99)
Glucose-Capillary: 65 mg/dL — ABNORMAL LOW (ref 70–99)

## 2024-05-08 LAB — TROPONIN I (HIGH SENSITIVITY)
Troponin I (High Sensitivity): 149 ng/L (ref <18)
Troponin I (High Sensitivity): 158 ng/L (ref <18)

## 2024-05-08 LAB — HCG, SERUM, QUALITATIVE: Preg, Serum: NEGATIVE

## 2024-05-08 LAB — BRAIN NATRIURETIC PEPTIDE: B Natriuretic Peptide: >4500 pg/mL — ABNORMAL HIGH (ref 0.0–100.0)

## 2024-05-08 LAB — MAGNESIUM: Magnesium: 2.4 mg/dL (ref 1.7–2.4)

## 2024-05-08 LAB — GLUCOSE, CAPILLARY: Glucose-Capillary: 161 mg/dL — ABNORMAL HIGH (ref 70–99)

## 2024-05-08 MED ORDER — SODIUM CHLORIDE 0.9 % IV SOLN: 100.0000 mg | Freq: Once | INTRAVENOUS | Status: DC

## 2024-05-08 MED ORDER — METHYLPREDNISOLONE SODIUM SUCC 125 MG IJ SOLR: 125.0000 mg | Freq: Once | INTRAMUSCULAR | Status: DC | PRN

## 2024-05-08 MED ORDER — LIDOCAINE HCL (PF) 1 % IJ SOLN: 5.0000 mL | INTRAMUSCULAR | Status: DC | PRN

## 2024-05-08 MED ORDER — ACETAMINOPHEN 500 MG PO TABS
1000.0000 mg | ORAL_TABLET | Freq: Four times a day (QID) | ORAL | Status: DC
  Administered 2024-05-08 – 2024-05-12 (×14): 1000 mg via ORAL
  Filled 2024-05-08 (×15): qty 2

## 2024-05-08 MED ORDER — AMIODARONE HCL 200 MG PO TABS
200.0000 mg | ORAL_TABLET | Freq: Every day | ORAL | Status: DC
  Administered 2024-05-08 – 2024-05-12 (×3): 200 mg via ORAL
  Filled 2024-05-08 (×6): qty 1

## 2024-05-08 MED ORDER — FAMOTIDINE IN NACL 20-0.9 MG/50ML-% IV SOLN: 20.0000 mg | Freq: Once | INTRAVENOUS | Status: DC | PRN

## 2024-05-08 MED ORDER — ANTICOAGULANT SODIUM CITRATE 4% (200MG/5ML) IV SOLN: 5.0000 mL | Status: DC | PRN

## 2024-05-08 MED ORDER — LIDOCAINE-PRILOCAINE 2.5-2.5 % EX CREA: 1.0000 | TOPICAL_CREAM | CUTANEOUS | Status: DC | PRN

## 2024-05-08 MED ORDER — ALTEPLASE 2 MG IJ SOLR: 2.0000 mg | Freq: Once | INTRAMUSCULAR | Status: DC | PRN

## 2024-05-08 MED ORDER — OXYCODONE HCL 5 MG PO TABS
5.0000 mg | ORAL_TABLET | Freq: Four times a day (QID) | ORAL | Status: DC | PRN
  Administered 2024-05-08 – 2024-05-12 (×11): 5 mg via ORAL
  Filled 2024-05-08 (×12): qty 1

## 2024-05-08 MED ORDER — LORAZEPAM 2 MG/ML IJ SOLN
0.5000 mg | INTRAMUSCULAR | Status: DC
  Administered 2024-05-08: 0.5 mg via INTRAVENOUS

## 2024-05-08 MED ORDER — LORAZEPAM 2 MG/ML IJ SOLN
INTRAMUSCULAR | Status: AC
  Filled 2024-05-08: qty 1

## 2024-05-08 MED ORDER — SODIUM CHLORIDE 0.9 % IV SOLN: INTRAVENOUS | Status: DC | PRN

## 2024-05-08 MED ORDER — PENTAFLUOROPROP-TETRAFLUOROETH EX AERO: 1.0000 | INHALATION_SPRAY | CUTANEOUS | Status: DC | PRN

## 2024-05-08 MED ORDER — DEXTROSE 50 % IV SOLN
1.0000 | Freq: Once | INTRAVENOUS | Status: AC
  Administered 2024-05-08: 50 mL via INTRAVENOUS
  Filled 2024-05-08: qty 50

## 2024-05-08 MED ORDER — HEPARIN SODIUM (PORCINE) 1000 UNIT/ML DIALYSIS: 1000.0000 [IU] | INTRAMUSCULAR | Status: DC | PRN

## 2024-05-08 MED ORDER — HEPARIN SODIUM (PORCINE) 1000 UNIT/ML DIALYSIS
1000.0000 [IU] | INTRAMUSCULAR | Status: DC | PRN
Start: 2024-05-08 — End: 2024-05-08

## 2024-05-08 MED ORDER — MIDODRINE HCL 5 MG PO TABS
ORAL_TABLET | ORAL | Status: AC
  Filled 2024-05-08: qty 2

## 2024-05-08 MED ORDER — CHLORHEXIDINE GLUCONATE CLOTH 2 % EX PADS
6.0000 | MEDICATED_PAD | Freq: Every day | CUTANEOUS | Status: DC
Start: 2024-05-08 — End: 2024-05-11

## 2024-05-08 MED ORDER — SEVELAMER CARBONATE 800 MG PO TABS
2400.0000 mg | ORAL_TABLET | Freq: Three times a day (TID) | ORAL | Status: DC
Start: 2024-05-08 — End: 2024-05-11
  Administered 2024-05-08 – 2024-05-11 (×7): 2400 mg via ORAL
  Filled 2024-05-08 (×7): qty 3

## 2024-05-08 MED ORDER — EPINEPHRINE 0.3 MG/0.3ML IJ SOAJ
0.3000 mg | Freq: Once | INTRAMUSCULAR | Status: DC | PRN
Start: 2024-05-08 — End: 2024-05-08

## 2024-05-08 MED ORDER — NEPRO/CARBSTEADY PO LIQD: 237.0000 mL | ORAL | Status: DC | PRN

## 2024-05-08 MED ORDER — HEPARIN SODIUM (PORCINE) 5000 UNIT/ML IJ SOLN
5000.0000 [IU] | Freq: Three times a day (TID) | INTRAMUSCULAR | Status: DC
  Administered 2024-05-08 – 2024-05-12 (×8): 5000 [IU] via SUBCUTANEOUS
  Filled 2024-05-08 (×11): qty 1

## 2024-05-08 MED ORDER — ALBUTEROL SULFATE HFA 108 (90 BASE) MCG/ACT IN AERS: 2.0000 | INHALATION_SPRAY | Freq: Once | RESPIRATORY_TRACT | Status: DC | PRN

## 2024-05-08 MED ORDER — MIDODRINE HCL 5 MG PO TABS
10.0000 mg | ORAL_TABLET | Freq: Three times a day (TID) | ORAL | Status: DC
  Administered 2024-05-08 – 2024-05-12 (×14): 10 mg via ORAL
  Filled 2024-05-08 (×13): qty 2

## 2024-05-08 MED ORDER — ALBUMIN HUMAN 25 % IV SOLN
25.0000 g | Freq: Once | INTRAVENOUS | Status: AC | PRN
  Administered 2024-05-10: 25 g via INTRAVENOUS

## 2024-05-08 MED ORDER — DIPHENHYDRAMINE HCL 50 MG/ML IJ SOLN: 50.0000 mg | Freq: Once | INTRAMUSCULAR | Status: DC | PRN

## 2024-05-08 MED ORDER — HEPARIN SODIUM (PORCINE) 1000 UNIT/ML DIALYSIS: 20.0000 [IU]/kg | INTRAMUSCULAR | Status: DC | PRN

## 2024-05-08 MED ORDER — FLUTICASONE PROPIONATE 50 MCG/ACT NA SUSP
2.0000 | Freq: Every day | NASAL | Status: DC
  Administered 2024-05-08: 2 via NASAL
  Filled 2024-05-08 (×2): qty 16

## 2024-05-08 MED ORDER — CINACALCET HCL 30 MG PO TABS
30.0000 mg | ORAL_TABLET | Freq: Every evening | ORAL | Status: DC
  Administered 2024-05-08 – 2024-05-11 (×4): 30 mg via ORAL
  Filled 2024-05-08 (×4): qty 1

## 2024-05-08 MED ORDER — ALTEPLASE 2 MG IJ SOLR
2.0000 mg | Freq: Once | INTRAMUSCULAR | Status: DC | PRN
Start: 2024-05-08 — End: 2024-05-08

## 2024-05-08 NOTE — ED Provider Notes (Signed)
 Emergency Department Provider Note   I have reviewed the triage vital signs and the nursing notes.   HISTORY  Chief Complaint Respiratory Distress   HPI [name] is a 43 y.o. female with PMH of A fib, ESRD, CHF (EF 30%) and Lupus presents to the ED with SOB worsening over the last 2 days. Her last HD session was Saturday but she reports not much fluid able to be taken off due to low BP. No fever. No CP. She has had increased SOB since. No abdominal pain or vomiting. EMS arrived to find the patient with tachypnea improved on CPAP en route to the ED.     Past Medical History:  Diagnosis Date   Anemia    low iron  - receives iron  at dialysis   Anxiety    Arthritis    RA   Atrial fibrillation with RVR (HCC) 12/14/2023   Chronic systolic congestive heart failure (HCC) 03/16/2016   Dyspnea    ESRD (end stage renal disease) (HCC)    [facility] _ [place]   H/O pericarditis 01/17/2013   H/O pleural effusion 01/17/2013   Heart murmur    Lupus (systemic lupus erythematosus) (HCC)    Previously followed with Dr. 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 pneumonae bacteremia on 01/21/2013. Sensitive to Peniccilin     Review of Systems  Constitutional: No fever/chills Cardiovascular: Denies chest pain. Respiratory: Positive shortness of breath. Gastrointestinal: No abdominal pain.  No nausea, no vomiting.  Musculoskeletal: Negative for back pain. Skin: Negative for rash. Neurological:  Negative for headaches.  ____________________________________________   PHYSICAL EXAM:  VITAL SIGNS: Vitals:  BP: (!) 98/52 95/62  Pulse: (!) 102 95  Resp: 18 18  Temp: 98.3 F (36.8 C) (!) 97.5 F (36.4 C)  SpO2: 97% 96%   Constitutional: Alert and oriented. Patient with respiratory distress but able to participate with exam and brief history.  Eyes: Conjunctivae are normal. Head: Atraumatic. Nose: No congestion/rhinnorhea. Mouth/Throat: Mucous membranes are moist.  Neck: No stridor.  Cardiovascular: Normal rate, regular rhythm. Good peripheral circulation. Grossly normal heart sounds.   Respiratory: increased respiratory effort.  No retractions. Lungs with crackles at the bases.  Gastrointestinal: Soft and nontender. No distention.  Musculoskeletal:  No gross deformities of extremities. Neurologic:  Normal speech and language.  Skin:  Skin is warm, dry and intact. No rash noted.   ____________________________________________   LABS (all labs ordered are listed, but only abnormal results are displayed)  Labs Reviewed  COMPREHENSIVE METABOLIC PANEL WITH GFR - Abnormal; Notable for the following components:      Result Value   Chloride 93 (*)    CO2 16 (*)    BUN 50 (*)    Creatinine, Ser 5.44 (*)    Calcium  8.1 (*)    Albumin  2.7 (*)    Alkaline Phosphatase 265 (*)    GFR, Estimated 10 (*)    Anion gap 29 (*)    All other components  within normal limits  CBC WITH DIFFERENTIAL/PLATELET - Abnormal; Notable for the following components:   RDW 19.9 (*)    nRBC 3.2 (*)    nRBC 8 (*)    All other components within normal limits  BRAIN NATRIURETIC PEPTIDE - Abnormal; Notable for the following components:   B Natriuretic Peptide >4,500.0 (*)    All other components within normal limits  CBC - Abnormal; Notable for the following components:   RDW 20.1 (*)    nRBC 3.2 (*)    All other components within normal limits  RENAL FUNCTION PANEL - Abnormal; Notable for the  following components:   Chloride 91 (*)    Glucose, Bld 48 (*)    BUN 28 (*)    Creatinine, Ser 3.31 (*)    Calcium  8.6 (*)    Albumin  2.9 (*)    GFR, Estimated 17 (*)    Anion gap 19 (*)    All other components within normal limits  GLUCOSE, CAPILLARY - Abnormal; Notable for the following components:   Glucose-Capillary 161 (*)    All other components within normal limits  I-STAT VENOUS BLOOD GAS, ED - Abnormal; Notable for the following components:   pCO2, Ven 41.3 (*)    Calcium , Ion 0.94 (*)    All other components within normal limits  I-STAT CHEM 8, ED - Abnormal; Notable for the following components:   BUN 51 (*)    Creatinine, Ser 5.40 (*)    Glucose, Bld 65 (*)    Calcium , Ion 0.92 (*)    All other components within normal limits  CBG MONITORING, ED - Abnormal; Notable for the following components:   Glucose-Capillary 67 (*)    All other components within normal limits  CBG MONITORING, ED - Abnormal; Notable for the following components:   Glucose-Capillary 65 (*)    All other components within normal limits  TROPONIN I (HIGH SENSITIVITY) - Abnormal; Notable for the following components:   Troponin I (High Sensitivity) 149 (*)    All other components within normal limits  TROPONIN I (HIGH SENSITIVITY) - Abnormal; Notable for the following components:   Troponin I (High Sensitivity) 158 (*)    All other components within normal limits  MAGNESIUM  HCG, SERUM, QUALITATIVE  CBG MONITORING, ED  CBG MONITORING, ED  CBG MONITORING, ED  CBG MONITORING, ED  CBG MONITORING, ED  CBG MONITORING, ED  CBG MONITORING, ED  CBG MONITORING, ED  CBG MONITORING, ED   ____________________________________________  EKG   EKG Interpretation Date/Time:  Monday May 08 2024 04:24:27 EDT Ventricular Rate:  103 PR Interval:  165 QRS Duration:  119 QT Interval:  408 QTC Calculation: 535 R Axis:   129  Text Interpretation: Sinus tachycardia Atrial premature complex Probable left  atrial enlargement IRBBB and LPFB Low voltage, extremity leads Borderline ST elevation, anterior leads Similar to April 2025 tracing Confirmed by Abby Hocking [phone]) on 05/08/2024 4:29:00 AM        ____________________________________________  RADIOLOGY  DG Chest Portable 1 View Result Date: 05/08/2024 CLINICAL DATA:  Shortness of breath EXAM: PORTABLE CHEST 1 VIEW COMPARISON:  04/17/2024 FINDINGS: Chronic cardiopericardial enlargement. Confluent interstitial opacity with hazy density and probable left pleural effusion. No pneumothorax. 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 Extensive artifact from EKG leads. IMPRESSION: Generalized opacity favoring edema. Electronically Signed   By: Ronnette Coke M.D.   On: 05/08/2024 05:39    ____________________________________________   PROCEDURES  Procedure(s) performed:   Procedures  CRITICAL  CARE Performed by: Roberts Ching Total critical care time: 35 minutes Critical care time was exclusive of separately billable procedures and treating other patients. Critical care was necessary to treat or prevent imminent or life-threatening deterioration. Critical care was time spent personally by me on the following activities: development of treatment plan with patient and/or surrogate as well as nursing, discussions with consultants, evaluation of patient's response to treatment, examination of patient, obtaining history from patient or surrogate, ordering and performing treatments and interventions, ordering and review of laboratory studies, ordering and review of radiographic studies, pulse oximetry and re-evaluation of patient's condition.  Abby Hocking, MD Emergency Medicine  ____________________________________________   INITIAL IMPRESSION / ASSESSMENT AND PLAN / ED COURSE  Pertinent labs & imaging results that were available during my care of the patient were reviewed by me and considered in my medical decision making (see chart for  details).   This patient is Presenting for Evaluation of SOB, which does require a range of treatment options, and is a complaint that involves a high risk of morbidity and mortality.  The Differential Diagnoses includes but is not exclusive to acute coronary syndrome, aortic dissection, pulmonary embolism, cardiac tamponade, community-acquired pneumonia, pericarditis, musculoskeletal chest wall pain, etc.   Critical Interventions-    Medications  Chlorhexidine  Gluconate Cloth 2 % PADS 6 each (6 each Topical Patient Refused/Not Given 05/08/24 0636)  albumin  human 25 % solution 25 g (has no administration in time range)  midodrine  (PROAMATINE ) tablet 10 mg (10 mg Oral Given 05/08/24 1733)  heparin  injection 5,000 Units (5,000 Units Subcutaneous Given 05/08/24 2119)  amiodarone  (PACERONE ) tablet 200 mg (200 mg Oral Given 05/08/24 1421)  cinacalcet  (SENSIPAR ) tablet 30 mg (30 mg Oral Given 05/08/24 1734)  fluticasone (FLONASE) 50 MCG/ACT nasal spray 2 spray (2 sprays Each Nare Given 05/08/24 1421)  sevelamer  carbonate (RENVELA ) tablet 2,400 mg (2,400 mg Oral Given 05/08/24 1734)  0.9 %  sodium chloride  infusion (has no administration in time range)  pentafluoroprop-tetrafluoroeth (GEBAUERS) aerosol 1 Application (has no administration in time range)  lidocaine  (PF) (XYLOCAINE ) 1 % injection 5 mL (has no administration in time range)  lidocaine -prilocaine  (EMLA ) cream 1 Application (has no administration in time range)  heparin  injection 1,000 Units (has no administration in time range)  anticoagulant sodium citrate  solution 5 mL (has no administration in time range)  alteplase  (CATHFLO ACTIVASE ) injection 2 mg (has no administration in time range)  heparin  injection 800 Units (has no administration in time range)  acetaminophen  (TYLENOL ) tablet 1,000 mg (1,000 mg Oral Given 05/08/24 2118)  oxyCODONE  (Oxy IR/ROXICODONE ) immediate release tablet 5 mg (5 mg Oral Given 05/08/24 2119)  dextrose  50 %  solution 50 mL (50 mLs Intravenous Given 05/08/24 0636)    Reassessment after intervention: tolerating BiPAP well.    I did obtain Additional Historical Information from EMS, as the patient is in distress.  I decided to review pertinent External Data, and in summary EF 30-35% on most recent ECHO.   Clinical Laboratory Tests Ordered, included troponin mildly elevated but similar to prior values. Likely affected by respiratory distress and ESRD. Doubt primary ACS. No anemia. Normal K.   Radiologic Tests Ordered, included CXR. I independently interpreted the images and agree with radiology interpretation.   Cardiac Monitor Tracing which shows NSR.    Social Determinants of Health Risk patient has a smoking history.   Consult complete with Nephrology, Dr. Zana Hesselbach. They will coordinate HD.   TRH. Plan for admit. They are in agreement.  Medical Decision Making: Summary:  Patient arrives to the emergency department in respiratory distress.  Clinically seems most consistent with fluid overload from combination of known CHF and ESRD.  Patient tolerating CPAP well with EMS.  Will transition to BiPAP here.   Reevaluation with update and discussion with patient. Tolerating BiPAP well. Plan for admit and HD. She is in agreement.   Patient's presentation is most consistent with acute presentation with potential threat to life or bodily function.   Disposition: admit  ____________________________________________  FINAL CLINICAL IMPRESSION(S) / ED DIAGNOSES  Final diagnoses:  Acute respiratory failure with hypoxia (HCC)    Note:  This document was prepared using Dragon voice recognition software and may include unintentional dictation errors.  Abby Hocking, MD, [facility] Emergency Medicine    Criselda Starke, Shereen Dike, MD 05/09/24 0200

## 2024-05-08 NOTE — ED Notes (Signed)
 Pt was placed on 4L nasal cannula by Respiratory Therapy and taken to dialysis by Transport.  All belongings taken w/ patient.

## 2024-05-08 NOTE — Consult Note (Addendum)
 Renal Service Consult Note [name]  [name] 05/08/2024 [name] Sandifer, MD Requesting Physician: Dr. Dolan Freiberg  Reason for Consult: ESRD pt w/ resp distress HPI: The patient is a 43 y.o. year-old w/ PMH as below who presented to ED d/t resp distress. Last HD Sat w/ 2 L removed but BP's were low so they couldn't get more fluid. EMS could not get an accurate O2 sat. In route pt required CPAP/ bipap.  In ED BP 109/ 62, HR 103, RR 22, temp 98. Na 138, K+ 5.1, BUN 50, creat 5.44. Alb 2.7, wbc 6K, Hb 12.3. CXR showed marked CM and diffuse pulm edema. Pt to be admitted. We are asked to see for dialysis.   Pt seen in HD unit, on dialysis. SOB worsening over last 2 days. Is on HD now and SOB starting to improve. No CP, no fevers, no chills.    ROS - denies CP, no joint pain, no HA, no blurry vision, no rash, no diarrhea, no nausea/ vomiting  PMH: Lupus ESRD on HD CHF EF 30% Atrial fibrillation  Past Surgical History  Past Surgical History:  Procedure Laterality Date   A/V FISTULAGRAM N/A 12/15/2023   Procedure: A/V Fistulagram;  Surgeon: Patrick Boor, MD;  Location: [facility] INVASIVE CV LAB;  Service: Cardiovascular;  Laterality: N/A;   AV FISTULA PLACEMENT     AV FISTULA PLACEMENT Right 08/01/2020   Procedure: RIGHT ARM BRACHIOCEPHALIC  ARTERIOVENOUS (AV) FISTULA CREATION;  Surgeon: Mayo Speck, MD;  Location: [facility] OR;  Service: Vascular;  Laterality: Right;   BASCILIC VEIN TRANSPOSITION Left 02/05/2014   Procedure: BASCILIC VEIN TRANSPOSITION;  Surgeon: Mayo Speck, MD;  Location: [facility] OR;  Service: Vascular;  Laterality: Left;   BASCILIC VEIN TRANSPOSITION Right 01/08/2021   Procedure: RIGHT ARM SECOND STAGE BASCILIC VEIN TRANSPOSITION;  Surgeon: Mayo Speck, MD;  Location: [facility] OR;  Service: Vascular;  Laterality: Right;   BIOPSY  12/17/2021   Procedure: BIOPSY;  Surgeon: Normie Becton., MD;  Location: [facility] ENDOSCOPY;  Service: Gastroenterology;;    ESOPHAGOGASTRODUODENOSCOPY (EGD) WITH PROPOFOL  N/A 12/17/2021   Procedure: ESOPHAGOGASTRODUODENOSCOPY (EGD) WITH PROPOFOL ;  Surgeon: Normie Becton., MD;  Location: [facility] ENDOSCOPY;  Service: Gastroenterology;  Laterality: N/A;   FISTULA SUPERFICIALIZATION Left 05/30/2018   Procedure: FISTULA PLICATION BASILIC VEIN TRANSPOSITION;  Surgeon: Dannis Dy, MD;  Location: [facility] OR;  Service: Vascular;  Laterality: Left;   FISTULA SUPERFICIALIZATION Left 12/18/2019   Procedure: PLICATION OF LEFT ARTERIOVENOUS FISTULA ULCER;  Surgeon: Mayo Speck, MD;  Location: [facility] OR;  Service: Vascular;  Laterality: Left;   I & D EXTREMITY Right 02/18/2021   Procedure: IRRIGATION AND DEBRIDEMENT OF ARM;  Surgeon: Dannis Dy, MD;  Location: [facility] OR;  Service: Vascular;  Laterality: Right;   INSERTION OF DIALYSIS CATHETER N/A 05/19/2020   Procedure: TUNNELED INSERTION  OF DIALYSIS CATHETER;  Surgeon: Adine Hoof, MD;  Location: [facility] OR;  Service: Vascular;  Laterality: N/A;   PERIPHERAL VASCULAR BALLOON ANGIOPLASTY  12/15/2023   Procedure: PERIPHERAL VASCULAR BALLOON ANGIOPLASTY;  Surgeon: Patrick Boor, MD;  Location: [facility] INVASIVE CV LAB;  Service: Cardiovascular;;   THROMBECTOMY AND REVISION OF ARTERIOVENTOUS (AV) GORETEX  GRAFT Left 07/28/2020   Procedure: Oversewing of left arm Brachial cephalic fistula for bleeding.;  Surgeon: Dannis Dy, MD;  Location: [facility] OR;  Service: Vascular;  Laterality: Left;   VENOGRAM Right 01/31/2014   Procedure: DIALYSIS CATHETER;  Surgeon: Margherita Shell, MD;  Location:  [facility] CATH LAB;  Service: Cardiovascular;  Laterality: Right;   Family History History reviewed. No pertinent family history. Social History  reports that she has been smoking cigarettes. She has a 1.8 pack-year smoking history. She has been exposed to tobacco smoke. She has never used smokeless tobacco. She reports current alcohol  use. She reports current drug use. Drug:  Marijuana. Allergies  Allergies  Allergen Reactions   Cephalosporins Rash    To both keflex  and cefazolin .  Potential bullous lesion from Ceftriaxone  11/2023.   Tobramycin Sulfate Swelling    Eye swelling   Vancomycin  Swelling   Home medications Prior to Admission medications   Medication Sig Start Date End Date Taking? Authorizing Provider  amiodarone  (PACERONE ) 200 MG tablet Take 1 tablet (200 mg total) by mouth 2 (two) times daily for 2 days. 04/21/24 04/23/24  Ghimire, Kuber, MD  amiodarone  (PACERONE ) 200 MG tablet Take 1 tablet (200 mg total) by mouth daily. 04/24/24   Vada Garibaldi, MD  benzonatate  (TESSALON ) 100 MG capsule Take 1-2 caps PO TID PRN 05/07/24   Mayers, Cari S, PA-C  cinacalcet  (SENSIPAR ) 30 MG tablet Take 1 tablet (30 mg total) by mouth every evening. 04/21/24   Ghimire, Kuber, MD  fluticasone (FLONASE) 50 MCG/ACT nasal spray Place 2 sprays into both nostrils daily. 05/07/24   Mayers, Cari S, PA-C  midodrine  (PROAMATINE ) 10 MG tablet Take 1 tablet (10 mg total) by mouth 3 (three) times daily with meals. Patient not taking: Reported on 04/17/2024 01/30/24   Loma Rising, MD  oxyCODONE  (OXY IR/ROXICODONE ) 5 MG immediate release tablet Take 1 tablet (5 mg total) by mouth 2 (two) times daily as needed for severe pain (pain score 7-10). 04/21/24   Vada Garibaldi, MD  sevelamer  carbonate (RENVELA ) 800 MG tablet Take 3 tablets (2,400 mg total) by mouth 3 (three) times daily. Patient not taking: Reported on 04/17/2024 01/30/24   Loma Rising, MD     Vitals:   05/08/24 9562 05/08/24 0419 05/08/24 0424 05/08/24 0515  BP:    109/62  Pulse:   100 (!) 104  Resp:    (!) 22  Temp:    98.2 F (36.8 C)  TempSrc:    Temporal  SpO2: 98%  100%   Weight:  39.7 kg    Height:  5\' 3"  (1.6 m)     Exam Gen alert, no distress, 4L [place] O2 No rash, cyanosis or gangrene Sclera anicteric, throat clear  No jvd or bruits Chest mostly clear, occ crackles R base RRR no MRG Abd soft ntnd no  mass or ascites +bs GU defer MS no joint effusions or deformity Ext no LE or UE edema, no other edema Neuro is alert, Ox 3 , nf    RUE AVF+bruit       Renal-related home meds: Sensipar  30 at bedtime Midodrine  10 tid Renvela  2.4 gm ac tid Others: Oxy IR, Flonase, amiodarone     OP HD[facility]  From 04/19/24 -> 3h   B400   39.5kg   2K bath  R AVF   Heparin  none   Assessment/ Plan: Respiratory distress: w/ pulm edema on xray, suggesting fluid overload in this esrd patient. Requiring bipap and is stable on bipap. Has relatively low BP, will need midodrine  and IV albumin  in HD this am. Plan is for 1st shift HD.  ESRD: on HD MWF. HD upstairs this am.  BP: bp's are 100- 110, tolerating well so far and BP's have not been an  issue. Cont midodrine  tid.  Volume: no gross edema, but she says excess fluid goes directly into the lungs. Max UF w/ HD today.  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

## 2024-05-08 NOTE — ED Notes (Signed)
 Multiple unsuccessful IV attempts. IV Team consult placed.

## 2024-05-08 NOTE — ED Triage Notes (Addendum)
 Pt BIB [facility] d/t resp distress.  Pt from home d/t SOB - rales Bilat, is a dialysis pt BPM 32 could not get an accurate O2 sat.  On CPAP pt read 98 % Tuesday Thursday Saturday Dialysis had 2L removed

## 2024-05-08 NOTE — H&P (Signed)
 History and Physical    Patient: [name]:[id number] DOB: December 05, 1981 DOA: 05/08/2024 DOS: the patient was seen and examined on 05/08/2024 PCP: Senaida Dama, NP  Patient coming from: Home  Chief Complaint:  Chief Complaint  Patient presents with   Respiratory Distress   HPI: [name] is a 43 y.o. female with medical history significant of ESRD (HE T/Th/Sat per report), SLE, and polysubstance abuse p/w AHRF iso volume overload.  Pt is a poor historian and somnolent while receiving HD at the time of this interview. From what I can gather, pt last HD session was on Saturday and shortened because of hypotension. Pt endorses intermittent SOB all Sunday until 0300, when she became so short of breath that is scared her enough to activate EMS. Pt denies any sick contacts.  In the ED, pt noted to have increased WOB and required 4-5L [place]; as such, pt admitted for ongoing HD. Renal following.   Review of Systems: As mentioned in the history of present illness. All other systems reviewed and are negative. Past Medical History:  Diagnosis Date   Anemia    low iron  - receives iron  at dialysis   Anxiety    Arthritis    RA   Atrial fibrillation with RVR (HCC) 12/14/2023   Chronic systolic congestive heart failure (HCC) 03/16/2016   Dyspnea    ESRD (end stage renal disease) (HCC)    [facility] _ [place]   H/O pericarditis 01/17/2013   H/O pleural effusion 01/17/2013   Heart murmur    Lupus (systemic lupus erythematosus) (HCC)    Previously followed with Dr. 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 pneumonae bacteremia on 01/21/2013. Sensitive to Peniccilin    Past Surgical History:  Procedure Laterality Date   A/V FISTULAGRAM N/A 12/15/2023   Procedure: A/V Fistulagram;  Surgeon: Patrick Boor, MD;  Location: [facility] INVASIVE CV LAB;  Service: Cardiovascular;  Laterality: N/A;   AV FISTULA PLACEMENT     AV FISTULA PLACEMENT Right 08/01/2020   Procedure: RIGHT ARM BRACHIOCEPHALIC  ARTERIOVENOUS (AV) FISTULA CREATION;  Surgeon: Mayo Speck, MD;  Location: [facility] OR;  Service: Vascular;  Laterality: Right;   BASCILIC VEIN TRANSPOSITION Left 02/05/2014   Procedure: BASCILIC VEIN TRANSPOSITION;  Surgeon: Mayo Speck, MD;  Location: [facility] OR;  Service: Vascular;  Laterality: Left;   BASCILIC VEIN TRANSPOSITION Right 01/08/2021   Procedure: RIGHT ARM SECOND STAGE BASCILIC VEIN TRANSPOSITION;  Surgeon: Mayo Speck, MD;  Location: [facility] OR;  Service: Vascular;  Laterality: Right;   BIOPSY  12/17/2021   Procedure: BIOPSY;  Surgeon: Normie Becton., MD;  Location: [facility] ENDOSCOPY;  Service: Gastroenterology;;   ESOPHAGOGASTRODUODENOSCOPY (EGD) WITH PROPOFOL  N/A 12/17/2021   Procedure: ESOPHAGOGASTRODUODENOSCOPY (EGD) WITH PROPOFOL ;  Surgeon: Normie Becton., MD;  Location: [facility] ENDOSCOPY;  Service: Gastroenterology;  Laterality: N/A;   FISTULA SUPERFICIALIZATION Left 05/30/2018   Procedure: FISTULA PLICATION BASILIC VEIN TRANSPOSITION;  Surgeon: Dannis Dy, MD;  Location:  [facility] OR;  Service: Vascular;  Laterality: Left;   FISTULA SUPERFICIALIZATION Left 12/18/2019   Procedure: PLICATION OF LEFT ARTERIOVENOUS FISTULA ULCER;  Surgeon: Mayo Speck, MD;  Location: [facility] OR;  Service: Vascular;  Laterality: Left;   I & D EXTREMITY Right 02/18/2021   Procedure: IRRIGATION AND DEBRIDEMENT OF ARM;  Surgeon: Dannis Dy, MD;  Location: [facility] OR;  Service: Vascular;  Laterality: Right;    INSERTION OF DIALYSIS CATHETER N/A 05/19/2020   Procedure: TUNNELED INSERTION  OF DIALYSIS CATHETER;  Surgeon: Adine Hoof, MD;  Location: [facility] OR;  Service: Vascular;  Laterality: N/A;   PERIPHERAL VASCULAR BALLOON ANGIOPLASTY  12/15/2023   Procedure: PERIPHERAL VASCULAR BALLOON ANGIOPLASTY;  Surgeon: Patrick Boor, MD;  Location: [facility] INVASIVE CV LAB;  Service: Cardiovascular;;   THROMBECTOMY AND REVISION OF ARTERIOVENTOUS (AV) GORETEX  GRAFT Left 07/28/2020   Procedure: Oversewing of left arm Brachial cephalic fistula for bleeding.;  Surgeon: Dannis Dy, MD;  Location: [facility] OR;  Service: Vascular;  Laterality: Left;   VENOGRAM Right 01/31/2014   Procedure: DIALYSIS CATHETER;  Surgeon: Margherita Shell, MD;  Location: [facility] CATH LAB;  Service: Cardiovascular;  Laterality: Right;   Social History:  reports that she has been smoking cigarettes. She has a 1.8 pack-year smoking history. She has been exposed to tobacco smoke. She has never used smokeless tobacco. She reports current alcohol  use. She reports current drug use. Drug: Marijuana.  Allergies  Allergen Reactions   Cephalosporins Rash    To both keflex  and cefazolin .  Potential bullous lesion from Ceftriaxone  11/2023.   Tobramycin Sulfate Swelling    Eye swelling   Vancomycin  Swelling    History reviewed. No pertinent family history.  Prior to Admission medications   Medication Sig Start Date End Date Taking? Authorizing Provider  amiodarone  (PACERONE ) 200 MG tablet Take 1 tablet (200 mg total) by mouth 2 (two) times daily for 2 days. 04/21/24 04/23/24  Ghimire, Kuber, MD  amiodarone  (PACERONE ) 200 MG tablet Take 1 tablet (200 mg total) by mouth daily. 04/24/24   Vada Garibaldi, MD  benzonatate  (TESSALON ) 100 MG capsule Take 1-2 caps PO TID PRN 05/07/24   Mayers, Cari S, PA-C  cinacalcet  (SENSIPAR ) 30 MG tablet Take 1 tablet (30 mg total) by mouth every evening. 04/21/24   Ghimire, Kuber, MD  fluticasone (FLONASE) 50 MCG/ACT  nasal spray Place 2 sprays into both nostrils daily. 05/07/24   Mayers, Cari S, PA-C  midodrine  (PROAMATINE ) 10 MG tablet Take 1 tablet (10 mg total) by mouth 3 (three) times daily with meals. Patient not taking: Reported on 04/17/2024 01/30/24   Loma Rising, MD  oxyCODONE  (OXY IR/ROXICODONE ) 5 MG immediate release tablet Take 1 tablet (5 mg total) by mouth 2 (two) times daily as needed for severe pain (pain score 7-10). 04/21/24   Vada Garibaldi, MD  sevelamer  carbonate (RENVELA ) 800 MG tablet Take 3 tablets (2,400 mg total) by mouth 3 (three) times daily. Patient not taking: Reported on 04/17/2024 01/30/24   Loma Rising, MD    Physical Exam: Vitals:   05/08/24 0819 05/08/24 0830 05/08/24 0900 05/08/24 0930  BP: 101/67 99/68 111/65 109/65  Pulse: 99 (!) 106 (!) 102 (!) 103  Resp: (!) 21 14 18  (!) 22  Temp:      TempSrc:      SpO2: 100% 100% 100%   Weight:      Height:       General: Alert, oriented x3, resting comfortably in no  acute distress Respiratory: Bibasilar rales; no wheezing Cardiovascular: Regular rate and rhythm w/o m/r/g  Data Reviewed:  Lab Results  Component Value Date   WBC 6.2 05/08/2024   HGB 12.9 05/08/2024   HCT 38.0 05/08/2024   MCV 91.0 05/08/2024   PLT 186 05/08/2024   Lab Results  Component Value Date   GLUCOSE 65 (L) 05/08/2024   CALCIUM  8.1 (L) 05/08/2024   NA 136 05/08/2024   K 4.9 05/08/2024   CO2 16 (L) 05/08/2024   CL 99 05/08/2024   BUN 51 (H) 05/08/2024   CREATININE 5.40 (H) 05/08/2024   Lab Results  Component Value Date   ALT 19 05/08/2024   AST 32 05/08/2024   ALKPHOS 265 (H) 05/08/2024   BILITOT 1.2 05/08/2024   Lab Results  Component Value Date   INR 1.4 (H) 12/16/2023   INR 1.5 (H) 12/13/2023   INR 1.3 (H) 04/12/2022    Radiology: DG Chest Portable 1 View Result Date: 05/08/2024 CLINICAL DATA:  Shortness of breath EXAM: PORTABLE CHEST 1 VIEW COMPARISON:  04/17/2024 FINDINGS: Chronic cardiopericardial enlargement.  Confluent interstitial opacity with hazy density and probable left pleural effusion. No pneumothorax. 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 Extensive artifact from EKG leads. IMPRESSION: Generalized opacity favoring edema. Electronically Signed   By: Ronnette Coke M.D.   On: 05/08/2024 05:39    Assessment and Plan: 68F h/o Afib, ESRD (HE T/Th/Sat per report), SLE, and polysubstance abuse p/w AHRF iso volume overload.  #AHRF 2/2 volume overload iso ESRD Pt admitted with SOB after abbreviated HD session. -Renal following; recs: HD on 5/12 -PTA renvela  and sensipar   #H/o Afib -PTA amiodarone  200mg  daily   Advance Care Planning:   Code Status: Full Code   Consults: Renal  Family Communication: N/A  Severity of Illness: The appropriate patient status for this patient is OBSERVATION. Observation status is judged to be reasonable and necessary in order to provide the required intensity of service to ensure the patient's safety. The patient's presenting symptoms, physical exam findings, and initial radiographic and laboratory data in the context of their medical condition is felt to place them at decreased risk for further clinical deterioration. Furthermore, it is anticipated that the patient will be medically stable for discharge from the hospital within 2 midnights of admission.    ------- I spent 55 minutes reviewing previous labs/notes, obtaining separate history at the bedside, counseling/discussing the treatment plan outlined above, ordering medications/tests, and performing clinical documentation.  Author: Arne Langdon, MD 05/08/2024 10:00 AM  For on call review [email or web address].

## 2024-05-08 NOTE — Procedures (Signed)
 I was present at the procedure, reviewed the HD regimen and made appropriate changes.   Larry Poag MD  [facility] 05/08/2024, 12:45 PM

## 2024-05-08 NOTE — Procedures (Signed)
 Received patient in bed to unit.  Alert and oriented.  Informed consent signed and in chart.   TX duration: 3.5 hours  Patient tolerated well.  Transported back to the room  Alert, without acute distress.  Hand-off given to patient's nurse.   Access used: right fistula Access issues: none  Total UF removed: 3.3 liters Medication(s) given: ativan ,midodrine    Clover Dao, RN [facility]

## 2024-05-08 NOTE — Progress Notes (Signed)
 Pt receives out-pt HD at [facility] on TTS 7:20 am chair time. Will assist as needed.   Lauraine Polite Renal Navigator [phone]

## 2024-05-09 DIAGNOSIS — J9601 Acute respiratory failure with hypoxia: Secondary | ICD-10-CM | POA: Diagnosis present

## 2024-05-09 DIAGNOSIS — Z79899 Other long term (current) drug therapy: Secondary | ICD-10-CM | POA: Diagnosis not present

## 2024-05-09 DIAGNOSIS — I9589 Other hypotension: Secondary | ICD-10-CM | POA: Diagnosis present

## 2024-05-09 DIAGNOSIS — Z681 Body mass index (BMI) 19 or less, adult: Secondary | ICD-10-CM | POA: Diagnosis not present

## 2024-05-09 DIAGNOSIS — E872 Acidosis, unspecified: Secondary | ICD-10-CM | POA: Diagnosis present

## 2024-05-09 DIAGNOSIS — R54 Age-related physical debility: Secondary | ICD-10-CM | POA: Diagnosis present

## 2024-05-09 DIAGNOSIS — M329 Systemic lupus erythematosus, unspecified: Secondary | ICD-10-CM | POA: Diagnosis present

## 2024-05-09 DIAGNOSIS — F17210 Nicotine dependence, cigarettes, uncomplicated: Secondary | ICD-10-CM | POA: Diagnosis present

## 2024-05-09 DIAGNOSIS — E162 Hypoglycemia, unspecified: Secondary | ICD-10-CM | POA: Diagnosis not present

## 2024-05-09 DIAGNOSIS — R64 Cachexia: Secondary | ICD-10-CM | POA: Diagnosis present

## 2024-05-09 DIAGNOSIS — E8770 Fluid overload, unspecified: Secondary | ICD-10-CM | POA: Diagnosis present

## 2024-05-09 DIAGNOSIS — D631 Anemia in chronic kidney disease: Secondary | ICD-10-CM | POA: Diagnosis present

## 2024-05-09 DIAGNOSIS — F419 Anxiety disorder, unspecified: Secondary | ICD-10-CM | POA: Diagnosis present

## 2024-05-09 DIAGNOSIS — N186 End stage renal disease: Secondary | ICD-10-CM | POA: Diagnosis present

## 2024-05-09 DIAGNOSIS — I5022 Chronic systolic (congestive) heart failure: Secondary | ICD-10-CM | POA: Diagnosis present

## 2024-05-09 DIAGNOSIS — N2581 Secondary hyperparathyroidism of renal origin: Secondary | ICD-10-CM | POA: Diagnosis present

## 2024-05-09 DIAGNOSIS — Z881 Allergy status to other antibiotic agents status: Secondary | ICD-10-CM | POA: Diagnosis not present

## 2024-05-09 DIAGNOSIS — Z992 Dependence on renal dialysis: Secondary | ICD-10-CM | POA: Diagnosis not present

## 2024-05-09 DIAGNOSIS — I4891 Unspecified atrial fibrillation: Secondary | ICD-10-CM | POA: Diagnosis present

## 2024-05-09 DIAGNOSIS — I4892 Unspecified atrial flutter: Secondary | ICD-10-CM | POA: Diagnosis present

## 2024-05-09 DIAGNOSIS — E43 Unspecified severe protein-calorie malnutrition: Secondary | ICD-10-CM | POA: Diagnosis present

## 2024-05-09 LAB — GLUCOSE, CAPILLARY
Glucose-Capillary: 75 mg/dL (ref 70–99)
Glucose-Capillary: 86 mg/dL (ref 70–99)

## 2024-05-09 MED ORDER — CHLORHEXIDINE GLUCONATE CLOTH 2 % EX PADS: 6.0000 | MEDICATED_PAD | Freq: Every day | CUTANEOUS | Status: DC

## 2024-05-09 NOTE — Plan of Care (Signed)

## 2024-05-09 NOTE — Progress Notes (Signed)
  [facility] Progress Note  Subjective:    Vitals:   05/09/24 0613 05/09/24 0805 05/09/24 1148 05/09/24 1632  BP: (!) 89/57 (!) 96/58 (!) 86/47 (!) 93/53  Pulse: 100 (!) 101 96 89  Resp:  17 19 16   Temp:  97.8 F (36.6 C) 98.9 F (37.2 C) 98.1 F (36.7 C)  TempSrc:  Oral  Oral  SpO2:  100% 100% 92%  Weight:      Height:        Exam: Gen alert, no distress, 4L [place] O2, frail No jvd or bruits Chest mostly clear RRR no MRG Abd soft ntnd no mass or ascites Ext no LE edema Neuro is alert, Ox 3 , nf    RUE AVF+bruit        Renal-related home meds: Sensipar  30 at bedtime Midodrine  10 tid Renvela  2.4 gm ac tid Others: Oxy IR, Flonase, amiodarone       [facility]  From 04/19/24 -> 3h   B400   39.5kg   2K bath  R AVF   Heparin  none     Assessment/ Plan: Respiratory distress: w/ pulm edema on xray, suggesting fluid overload in this esrd patient. Required bipap in ED. BP's soft. Had HD yest w/ 3.3 L off. Looks much better today, off O2.  ESRD: on HD MWF. HD Wed.  BP: bp's soft 80s-100s.  Cont midodrine  tid Volume: not sig vol overloaded on exam. 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   No results for input(s): "IRON ", "TIBC", "FERRITIN" in the last 168 hours. Inpatient medications:  acetaminophen   1,000 mg Oral QID   amiodarone   200 mg Oral Daily   Chlorhexidine  Gluconate Cloth  6 each Topical Q0600   cinacalcet   30 mg Oral QPM   fluticasone  2 spray Each Nare Daily   heparin   5,000  Units Subcutaneous Q8H   midodrine   10 mg Oral TID WC   sevelamer  carbonate  2,400 mg Oral TID WC    sodium chloride      albumin  human     anticoagulant sodium citrate      sodium chloride , albumin  human, alteplase , anticoagulant sodium citrate , heparin , heparin , lidocaine  (PF), lidocaine -prilocaine , oxyCODONE , pentafluoroprop-tetrafluoroeth

## 2024-05-09 NOTE — Progress Notes (Signed)
 PROGRESS NOTE    [name]  ZOX:[id number] DOB: 1981-11-10 DOA: 05/08/2024 PCP: Senaida Dama, NP    Brief Narrative:  43 year old with ESRD on hemodialysis TTS is scheduled, history of lupus, prior polysubstance abuse, severe congestive heart failure and recurrent admissions presented to the emergency room with shortness of breath started Sunday after incomplete hemodialysis on Saturday.  At the emergency room increased work of breathing requiring 5 L of oxygen.  Found to have fluid overload.  Admitted and underwent urgent dialysis with improvement.  Subjective: Patient seen and examined.  She feels much better than yesterday but still gets short of breath on mobility.  Denies any other issues. Most of the blood pressure recorded are less than 90 systolic with patient having no evidence of dizziness or lightheadedness.  She has severe muscle wasting and cachexia.  Will treat only for symptomatic change in blood pressures.   Assessment & Plan:   Fluid overload secondary to incomplete fluid removal on dialysis ESRD on hemodialysis TTS Acute hypoxic respite failure with respiratory distress:  Patient was taken to urgent hemodialysis 5/12 with improvement of symptoms.  Initially on CPAP and currently on room air. She has extensive chronic issues and limited fluid removal due to hypotension. Patient will need another dialysis under supervision in the hospital before transitioning to outpatient dialysis. Start mobilizing.  Nephrology following.  Chronic congestive heart failure: Known ejection fraction 30%.  Challenging to dialyze with hypotension.  Fluid balance through hemodialysis.  Today euvolemic.  Chronic A-flutter: Rate controlled.  On amiodarone .  Not on anticoagulation with history of intracranial bleeding.  Severe protein calorie malnutrition: Augment nutrition.         DVT prophylaxis: heparin  injection 5,000 Units Start: 05/08/24 1400   Code Status: Full  code Family Communication: None at the bedside Disposition Plan: Status is: Inpatient Remains inpatient appropriate because: Will need dialysis under supervision     Consultants:  Nephrology  Procedures:  Dialysis  Antimicrobials:  None     Objective: Vitals:   05/09/24 0611 05/09/24 0613 05/09/24 0805 05/09/24 1148  BP: (!) 86/59 (!) 89/57 (!) 96/58 (!) 86/47  Pulse: 97 100 (!) 101 96  Resp: 16  17 19   Temp:   97.8 F (36.6 C) 98.9 F (37.2 C)  TempSrc:   Oral   SpO2:   100% 100%  Weight:      Height:        Intake/Output Summary (Last 24 hours) at 05/09/2024 1256 Last data filed at 05/08/2024 1611 Gross per 24 hour  Intake 9 ml  Output --  Net 9 ml   Filed Weights   05/08/24 0419  Weight: 39.7 kg    Examination:  General exam: Appears calm and comfortable  Frail.  Cachectic.  On room air. Respiratory system: Expiratory crackles in the posterior lung base.  Normal work of breathing. Cardiovascular system: S1 & S2 heard, RRR. No JVD, murmurs, rubs, gallops or clicks. No pedal edema. Gastrointestinal system: Abdomen is nondistended, soft and nontender. No organomegaly or masses felt. Normal bowel sounds heard. Central nervous system: Alert and oriented. No focal neurological deficits. Extremities:  AV fistula upper arm.    Data Reviewed: I have personally reviewed following labs and imaging studies  CBC: Recent Labs  Lab 05/08/24 0455 05/08/24 0536 05/08/24 0538 05/08/24 1408  WBC 6.2  --   --  6.6  NEUTROABS 4.5  --   --   --   HGB 12.3 12.6 12.9 12.9  HCT  39.4 37.0 38.0 40.3  MCV 91.0  --   --  88.2  PLT 186  --   --  238   Basic Metabolic Panel: Recent Labs  Lab 05/08/24 0455 05/08/24 0536 05/08/24 0538 05/08/24 1409  NA 138 136 136 135  K 5.1 4.9 4.9 4.5  CL 93*  --  99 91*  CO2 16*  --   --  25  GLUCOSE 70  --  65* 48*  BUN 50*  --  51* 28*  CREATININE 5.44*  --  5.40* 3.31*  CALCIUM  8.1*  --   --  8.6*  MG 2.4  --   --   --    PHOS  --   --   --  4.2   GFR: Estimated Creatinine Clearance: 14.2 mL/min (A) (by C-G formula based on SCr of 3.31 mg/dL (H)). Liver Function Tests: Recent Labs  Lab 05/08/24 0455 05/08/24 1409  AST 32  --   ALT 19  --   ALKPHOS 265*  --   BILITOT 1.2  --   PROT 7.8  --   ALBUMIN  2.7* 2.9*   No results for input(s): "LIPASE", "AMYLASE" in the last 168 hours. No results for input(s): "AMMONIA" in the last 168 hours. Coagulation Profile: No results for input(s): "INR", "PROTIME" in the last 168 hours. Cardiac Enzymes: No results for input(s): "CKTOTAL", "CKMB", "CKMBINDEX", "TROPONINI" in the last 168 hours. BNP (last 3 results) No results for input(s): "PROBNP" in the last 8760 hours. HbA1C: No results for input(s): "HGBA1C" in the last 72 hours. CBG: Recent Labs  Lab 05/08/24 0421 05/08/24 0629 05/08/24 1950 05/09/24 0552  GLUCAP 67* 65* 161* 75   Lipid Profile: No results for input(s): "CHOL", "HDL", "LDLCALC", "TRIG", "CHOLHDL", "LDLDIRECT" in the last 72 hours. Thyroid Function Tests: No results for input(s): "TSH", "T4TOTAL", "FREET4", "T3FREE", "THYROIDAB" in the last 72 hours. Anemia Panel: No results for input(s): "VITAMINB12", "FOLATE", "FERRITIN", "TIBC", "IRON ", "RETICCTPCT" in the last 72 hours. Sepsis Labs: No results for input(s): "PROCALCITON", "LATICACIDVEN" in the last 168 hours.  No results found for this or any previous visit (from the past 240 hours).       Radiology Studies: DG Chest Portable 1 View Result Date: 05/08/2024 CLINICAL DATA:  Shortness of breath EXAM: PORTABLE CHEST 1 VIEW COMPARISON:  04/17/2024 FINDINGS: Chronic cardiopericardial enlargement. Confluent interstitial opacity with hazy density and probable left pleural effusion. No pneumothorax. 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 Extensive artifact from EKG leads. IMPRESSION: Generalized opacity favoring edema. Electronically Signed   By: Ronnette Coke M.D.   On:  05/08/2024 05:39        Scheduled Meds:  acetaminophen   1,000 mg Oral QID   amiodarone   200 mg Oral Daily   Chlorhexidine  Gluconate Cloth  6 each Topical Q0600   cinacalcet   30 mg Oral QPM   fluticasone  2 spray Each Nare Daily   heparin   5,000 Units Subcutaneous Q8H   midodrine   10 mg Oral TID WC   sevelamer  carbonate  2,400 mg Oral TID WC   Continuous Infusions:  sodium chloride      albumin  human     anticoagulant sodium citrate        LOS: 0 days      Vada Garibaldi, MD [facility]

## 2024-05-10 ENCOUNTER — Inpatient Hospital Stay (HOSPITAL_COMMUNITY)

## 2024-05-10 DIAGNOSIS — J9601 Acute respiratory failure with hypoxia: Secondary | ICD-10-CM | POA: Diagnosis not present

## 2024-05-10 LAB — RENAL FUNCTION PANEL
Albumin: 2.4 g/dL — ABNORMAL LOW (ref 3.5–5.0)
Anion gap: 19 — ABNORMAL HIGH (ref 5–15)
BUN: 76 mg/dL — ABNORMAL HIGH (ref 6–20)
CO2: 22 mmol/L (ref 22–32)
Calcium: 7.1 mg/dL — ABNORMAL LOW (ref 8.9–10.3)
Chloride: 92 mmol/L — ABNORMAL LOW (ref 98–111)
Creatinine, Ser: 5.98 mg/dL — ABNORMAL HIGH (ref 0.44–1.00)
GFR, Estimated: 9 mL/min — ABNORMAL LOW (ref >60)
Glucose, Bld: 80 mg/dL (ref 70–99)
Phosphorus: 7.6 mg/dL — ABNORMAL HIGH (ref 2.5–4.6)
Potassium: 5.8 mmol/L — ABNORMAL HIGH (ref 3.5–5.1)
Sodium: 133 mmol/L — ABNORMAL LOW (ref 135–145)

## 2024-05-10 LAB — CBC
HCT: 33.9 % — ABNORMAL LOW (ref 36.0–46.0)
Hemoglobin: 10.8 g/dL — ABNORMAL LOW (ref 12.0–15.0)
MCH: 27.6 pg (ref 26.0–34.0)
MCHC: 31.9 g/dL (ref 30.0–36.0)
MCV: 86.7 fL (ref 80.0–100.0)
Platelets: 251 10*3/uL (ref 150–400)
RBC: 3.91 MIL/uL (ref 3.87–5.11)
RDW: 19.5 % — ABNORMAL HIGH (ref 11.5–15.5)
WBC: 7.2 10*3/uL (ref 4.0–10.5)
nRBC: 1 % — ABNORMAL HIGH (ref 0.0–0.2)

## 2024-05-10 LAB — GLUCOSE, CAPILLARY: Glucose-Capillary: 84 mg/dL (ref 70–99)

## 2024-05-10 MED ORDER — ONDANSETRON HCL 4 MG PO TABS
8.0000 mg | ORAL_TABLET | Freq: Once | ORAL | Status: AC
  Administered 2024-05-10: 8 mg via ORAL
  Filled 2024-05-10: qty 2

## 2024-05-10 MED ORDER — OXYCODONE HCL 5 MG PO TABS
ORAL_TABLET | ORAL | Status: AC
  Filled 2024-05-10: qty 1

## 2024-05-10 MED ORDER — LORAZEPAM 2 MG/ML IJ SOLN
0.5000 mg | Freq: Four times a day (QID) | INTRAMUSCULAR | Status: DC | PRN
  Administered 2024-05-12: 0.5 mg via INTRAVENOUS
  Filled 2024-05-10: qty 1

## 2024-05-10 NOTE — Progress Notes (Addendum)
 [facility] Progress Note  Subjective:  BP's remain low in 90s Got 2.5 L UF this am w/ IV albumin / midodrine   Vitals:   05/10/24 1130 05/10/24 1138 05/10/24 1146 05/10/24 1209  BP: (!) 91/52  (!) 90/44   Pulse: (!) 104  (!) 103   Resp: 19 20 16    Temp:   (!) 97.3 F (36.3 C)   TempSrc:      SpO2: 98%  98%   Weight:    38.2 kg  Height:        Exam: Gen alert, no distress, 2 L [place] O2, frail No jvd or bruits Chest mostly clear RRR no MRG Abd soft ntnd no mass or ascites Ext no LE edema Neuro is alert, Ox 3 , nf    RUE AVF+bruit        Renal-related home meds: Sensipar  30 at bedtime Midodrine  10 tid Renvela  2.4 gm ac tid Others: Oxy IR, Flonase, amiodarone       [facility]  [facility]   B400   39.5kg   2K bath  R AVF   Heparin  none     Assessment/ Plan: Respiratory distress: w/ pulm edema, prob overload in esrd patient. Required bipap in ED. BP's always are soft. Had HD yest w/ 3.3 L off and HD today w/ 2.5 L off. O2 support has improved from bipap > 5L > 3 L > 2 L [place]. Don't think she is on home O2.  ESRD: on HD TTS. HD here Monday, HD today off schedule.  BP: bp's soft 80s-100s.  Cont midodrine  10 tid Volume: not sig vol overloaded on exam. "Goes straight to my lungs" per the patient.   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 This is 4 admit in last 6 mos. Have d/w pt's family our concerns about her decline. Suggest palliative consult.       Larry Poag MD  [facility] 05/10/2024, 2:53 PM  Recent Labs  Lab 05/08/24 1408 05/08/24 1409 05/10/24 0844 05/10/24 0845  HGB 12.9  --   --  10.8*  ALBUMIN   --  2.9* 2.4*  --   CALCIUM   --  8.6* 7.1*  --   PHOS  --  4.2 7.6*  --   CREATININE  --  3.31* 5.98*  --   K  --  4.5 5.8*  --    No results for input(s): "IRON ", "TIBC", "FERRITIN" in the last 168  hours. Inpatient medications:  acetaminophen   1,000 mg Oral QID   amiodarone   200 mg Oral Daily   Chlorhexidine  Gluconate Cloth  6 each Topical Q0600   Chlorhexidine  Gluconate Cloth  6 each Topical Q0600   cinacalcet   30 mg Oral QPM   fluticasone  2 spray Each Nare Daily   heparin   5,000 Units Subcutaneous Q8H   midodrine   10 mg Oral TID WC   sevelamer  carbonate  2,400 mg Oral TID WC    sodium chloride      sodium chloride , oxyCODONE 

## 2024-05-10 NOTE — Progress Notes (Signed)
 Repeat [place] vitals not completed right on time, patient off unit at CT when due. VS repeated when patient returned to unit, still in yellow Muse. On Call MD aware.  Patient c/o abdominal pain RLQ this shift. On call MD notified and CT of abdomen ordered. Will continue to monitor this shift.

## 2024-05-10 NOTE — Progress Notes (Signed)
  Progress Note   Date: 05/10/2024  Patient Name: [name]        MRN#: [id number]  Review the patient's clinical findings supports the diagnosis of:  Mild to moderate hypoglycemia.  Hypoglycemia (Level Mild to moderate)

## 2024-05-10 NOTE — Progress Notes (Signed)
 [facility] night cross cover note:   I was notified by the patient's RN that the patient is requesting medication for anxiety.  I subsequently ordered as needed IV Ativan  for anxiety.     Camelia Cavalier, DO Hospitalist

## 2024-05-10 NOTE — Plan of Care (Signed)

## 2024-05-10 NOTE — Progress Notes (Signed)
 Informed by RN that patient had an episode of vomiting and is complaining of right lower quadrant abdominal pain.  Vital signs checked.  She is afebrile but slightly tachycardic and blood pressure soft.  Stat CT abdomen pelvis ordered for further evaluation.

## 2024-05-11 DIAGNOSIS — J9601 Acute respiratory failure with hypoxia: Secondary | ICD-10-CM | POA: Diagnosis not present

## 2024-05-11 LAB — BASIC METABOLIC PANEL WITH GFR
Anion gap: 17 — ABNORMAL HIGH (ref 5–15)
BUN: 60 mg/dL — ABNORMAL HIGH (ref 6–20)
CO2: 26 mmol/L (ref 22–32)
Calcium: 7.6 mg/dL — ABNORMAL LOW (ref 8.9–10.3)
Chloride: 90 mmol/L — ABNORMAL LOW (ref 98–111)
Creatinine, Ser: 4.91 mg/dL — ABNORMAL HIGH (ref 0.44–1.00)
GFR, Estimated: 11 mL/min — ABNORMAL LOW (ref >60)
Glucose, Bld: 70 mg/dL (ref 70–99)
Potassium: 4.7 mmol/L (ref 3.5–5.1)
Sodium: 133 mmol/L — ABNORMAL LOW (ref 135–145)

## 2024-05-11 LAB — CBC WITH DIFFERENTIAL/PLATELET
Abs Immature Granulocytes: 0.02 10*3/uL (ref 0.00–0.07)
Basophils Absolute: 0 10*3/uL (ref 0.0–0.1)
Basophils Relative: 0 %
Eosinophils Absolute: 0.1 10*3/uL (ref 0.0–0.5)
Eosinophils Relative: 1 %
HCT: 35.4 % — ABNORMAL LOW (ref 36.0–46.0)
Hemoglobin: 11.4 g/dL — ABNORMAL LOW (ref 12.0–15.0)
Immature Granulocytes: 0 %
Lymphocytes Relative: 14 %
Lymphs Abs: 1 10*3/uL (ref 0.7–4.0)
MCH: 28.5 pg (ref 26.0–34.0)
MCHC: 32.2 g/dL (ref 30.0–36.0)
MCV: 88.5 fL (ref 80.0–100.0)
Monocytes Absolute: 1 10*3/uL (ref 0.1–1.0)
Monocytes Relative: 15 %
Neutro Abs: 4.7 10*3/uL (ref 1.7–7.7)
Neutrophils Relative %: 70 %
Platelets: 211 10*3/uL (ref 150–400)
RBC: 4 MIL/uL (ref 3.87–5.11)
RDW: 20.6 % — ABNORMAL HIGH (ref 11.5–15.5)
WBC: 6.7 10*3/uL (ref 4.0–10.5)
nRBC: 0.7 % — ABNORMAL HIGH (ref 0.0–0.2)

## 2024-05-11 MED ORDER — ALTEPLASE 2 MG IJ SOLR: 2.0000 mg | Freq: Once | INTRAMUSCULAR | Status: DC | PRN

## 2024-05-11 MED ORDER — LIDOCAINE-PRILOCAINE 2.5-2.5 % EX CREA: 1.0000 | TOPICAL_CREAM | CUTANEOUS | Status: DC | PRN

## 2024-05-11 MED ORDER — PENTAFLUOROPROP-TETRAFLUOROETH EX AERO: 1.0000 | INHALATION_SPRAY | CUTANEOUS | Status: DC | PRN

## 2024-05-11 MED ORDER — ANTICOAGULANT SODIUM CITRATE 4% (200MG/5ML) IV SOLN: 5.0000 mL | Status: DC | PRN

## 2024-05-11 MED ORDER — NEPRO/CARBSTEADY PO LIQD: 237.0000 mL | ORAL | Status: DC | PRN

## 2024-05-11 MED ORDER — LIDOCAINE HCL (PF) 1 % IJ SOLN: 5.0000 mL | INTRAMUSCULAR | Status: DC | PRN

## 2024-05-11 MED ORDER — CHLORHEXIDINE GLUCONATE CLOTH 2 % EX PADS
6.0000 | MEDICATED_PAD | Freq: Every day | CUTANEOUS | Status: DC
  Administered 2024-05-12: 6 via TOPICAL

## 2024-05-11 MED ORDER — HEPARIN SODIUM (PORCINE) 1000 UNIT/ML DIALYSIS: 1000.0000 [IU] | INTRAMUSCULAR | Status: DC | PRN

## 2024-05-11 MED ORDER — ALBUMIN HUMAN 25 % IV SOLN
INTRAVENOUS | Status: AC
  Filled 2024-05-11: qty 100

## 2024-05-11 MED ORDER — SEVELAMER CARBONATE 800 MG PO TABS
1600.0000 mg | ORAL_TABLET | ORAL | Status: DC
  Administered 2024-05-12: 1600 mg via ORAL
  Filled 2024-05-11: qty 2

## 2024-05-11 MED ORDER — ALBUMIN HUMAN 25 % IV SOLN
25.0000 g | Freq: Once | INTRAVENOUS | Status: AC
  Administered 2024-05-11: 25 g via INTRAVENOUS

## 2024-05-11 MED ORDER — HYDROXYZINE HCL 25 MG PO TABS
25.0000 mg | ORAL_TABLET | Freq: Three times a day (TID) | ORAL | Status: DC | PRN
  Administered 2024-05-11 – 2024-05-12 (×2): 25 mg via ORAL
  Filled 2024-05-11 (×3): qty 1

## 2024-05-11 MED ORDER — SEVELAMER CARBONATE 800 MG PO TABS
3200.0000 mg | ORAL_TABLET | Freq: Three times a day (TID) | ORAL | Status: DC
  Administered 2024-05-11 – 2024-05-12 (×3): 3200 mg via ORAL
  Filled 2024-05-11 (×2): qty 4

## 2024-05-11 NOTE — Plan of Care (Signed)

## 2024-05-11 NOTE — Progress Notes (Signed)
 PROGRESS NOTE    [name]  DGL:[id number] DOB: May 22, 1981 DOA: 05/08/2024 PCP: Senaida Dama, NP    Brief Narrative:  43 year old with ESRD on hemodialysis TTS is scheduled, history of lupus, prior polysubstance abuse, severe congestive heart failure and recurrent admissions presented to the emergency room with shortness of breath started Sunday after incomplete hemodialysis on Saturday.  At the emergency room increased work of breathing requiring 5 L of oxygen.  Found to have fluid overload.  Admitted and underwent urgent dialysis with improvement.  Subjective: Patient seen and examined following dialysis. She states that she feels very fatigued. No new complaints. She continues to feel short of breath.  Blood pressures immediately following dialysis are low normal at 101/58. Continue to monitor.  CT abdomen and pelvis performed to investigate complaints of abdominal pain demonstrated free flowing ascites that is increased over the past month. No other acute or inflammatory process is noted. The CT continues to demonstrate severe cardiomegaly and generalized calcified atherosclerosis. There was also trace layering pleural effusions as well as patchy lung base ground glass opacity that was unchanged from one month ago.  Palliative care has been ordered by nephrology.   Assessment & Plan:   Fluid overload secondary to incomplete fluid removal on dialysis ESRD on hemodialysis TTS Acute hypoxic respite failure with respiratory distress:  Patient was taken to urgent hemodialysis 5/12 with improvement of symptoms.  Initially on CPAP and currently on room air. She has extensive chronic issues and limited fluid removal due to hypotension. Patient will need another dialysis under supervision in the hospital before transitioning to outpatient dialysis. Start mobilizing.  Nephrology following.  Chronic congestive heart failure: Known ejection fraction 30%.  Challenging to dialyze with  hypotension.  Fluid balance through hemodialysis.  Today euvolemic.  Chronic A-flutter: Rate controlled.  On amiodarone .  Not on anticoagulation with history of intracranial bleeding.  Severe protein calorie malnutrition: Nutrition consulted.  Augment nutrition.         DVT prophylaxis: heparin  injection 5,000 Units Start: 05/08/24 1400 Patient refused heparin  on 05/10/2024.  Code Status: Full code Family Communication: None at the bedside Disposition Plan: Status is: Inpatient Remains inpatient appropriate because: Will need dialysis under supervision     Consultants:  Nephrology Palliative care  Procedures:  Dialysis  Antimicrobials:  None     Objective: Vitals:   05/10/24 2321 05/11/24 0428 05/11/24 0724 05/11/24 1656  BP: (!) 92/52 (!) 91/52 (!) 88/46 (!) 88/50  Pulse:  (!) 101 (!) 103 (!) 101  Resp:  20    Temp:  98 F (36.7 C) 98.9 F (37.2 C) 97.9 F (36.6 C)  TempSrc:    Oral  SpO2:  99% 94% 97%  Weight:      Height:       No intake or output data in the 24 hours ending 05/11/24 1840  Filed Weights   05/08/24 0419 05/10/24 1209  Weight: 39.7 kg 38.2 kg    Examination:  Exam:  Constitutional:  The patient is awake, alert, and oriented x 3. No acute distress. Respiratory:  No increased work of breathing. No wheezes, rales, or rhonchi No tactile fremitus Cardiovascular:  Regular rate and rhythm No murmurs, ectopy, or gallups. No lateral PMI. No thrills. Abdomen:  Abdomen is soft, non-tender, non-distended No hernias, masses, or organomegaly Normoactive bowel sounds.  Musculoskeletal:  No cyanosis, clubbing, or edema Fistula in right upper extremity. Cachexia Skin:  No rashes, lesions, ulcers palpation of skin: no induration or nodules  Neurologic:  CN 2-12 intact Sensation all 4 extremities intact Psychiatric:  Affect is flat and mood is depressed.  Data Reviewed: I have personally reviewed following labs and imaging  studies  CBC: Recent Labs  Lab 05/08/24 0455 05/08/24 0536 05/08/24 0538 05/08/24 1408 05/10/24 0845 05/11/24 0608  WBC 6.2  --   --  6.6 7.2 6.7  NEUTROABS 4.5  --   --   --   --  4.7  HGB 12.3 12.6 12.9 12.9 10.8* 11.4*  HCT 39.4 37.0 38.0 40.3 33.9* 35.4*  MCV 91.0  --   --  88.2 86.7 88.5  PLT 186  --   --  238 251 211   Basic Metabolic Panel: Recent Labs  Lab 05/08/24 0455 05/08/24 0536 05/08/24 0538 05/08/24 1409 05/10/24 0844 05/11/24 0608  NA 138 136 136 135 133* 133*  K 5.1 4.9 4.9 4.5 5.8* 4.7  CL 93*  --  99 91* 92* 90*  CO2 16*  --   --  25 22 26   GLUCOSE 70  --  65* 48* 80 70  BUN 50*  --  51* 28* 76* 60*  CREATININE 5.44*  --  5.40* 3.31* 5.98* 4.91*  CALCIUM  8.1*  --   --  8.6* 7.1* 7.6*  MG 2.4  --   --   --   --   --   PHOS  --   --   --  4.2 7.6*  --    GFR: Estimated Creatinine Clearance: 9.2 mL/min (A) (by C-G formula based on SCr of 4.91 mg/dL (H)). Liver Function Tests: Recent Labs  Lab 05/08/24 0455 05/08/24 1409 05/10/24 0844  AST 32  --   --   ALT 19  --   --   ALKPHOS 265*  --   --   BILITOT 1.2  --   --   PROT 7.8  --   --   ALBUMIN  2.7* 2.9* 2.4*   No results for input(s): "LIPASE", "AMYLASE" in the last 168 hours. No results for input(s): "AMMONIA" in the last 168 hours. Coagulation Profile: No results for input(s): "INR", "PROTIME" in the last 168 hours. Cardiac Enzymes: No results for input(s): "CKTOTAL", "CKMB", "CKMBINDEX", "TROPONINI" in the last 168 hours. BNP (last 3 results) No results for input(s): "PROBNP" in the last 8760 hours. HbA1C: No results for input(s): "HGBA1C" in the last 72 hours. CBG: Recent Labs  Lab 05/08/24 0629 05/08/24 1950 05/09/24 0552 05/09/24 2143 05/10/24 0033  GLUCAP 65* 161* 75 86 84   Lipid Profile: No results for input(s): "CHOL", "HDL", "LDLCALC", "TRIG", "CHOLHDL", "LDLDIRECT" in the last 72 hours. Thyroid Function Tests: No results for input(s): "TSH", "T4TOTAL", "FREET4",  "T3FREE", "THYROIDAB" in the last 72 hours. Anemia Panel: No results for input(s): "VITAMINB12", "FOLATE", "FERRITIN", "TIBC", "IRON ", "RETICCTPCT" in the last 72 hours. Sepsis Labs: No results for input(s): "PROCALCITON", "LATICACIDVEN" in the last 168 hours.  No results found for this or any previous visit (from the past 240 hours).       Radiology Studies: CT ABDOMEN PELVIS WO CONTRAST Result Date: 05/10/2024 CLINICAL DATA:  43 year old female with right lower quadrant abdominal pain. History of heart failure, bacteremia, lupus, end-stage renal disease. EXAM: CT ABDOMEN AND PELVIS WITHOUT CONTRAST TECHNIQUE: Multidetector CT imaging of the abdomen and pelvis was performed following the standard protocol without IV contrast. RADIATION DOSE REDUCTION: This exam was performed according to the departmental dose-optimization program which includes automated exposure control, adjustment of the mA and/or kV  according to patient size and/or use of iterative reconstruction technique. COMPARISON:  CT Abdomen and Pelvis with contrast 04/17/2024. FINDINGS: Lower chest: Severe cardiomegaly. No significant change from last month. No pericardial effusion. Trace mostly layering pleural effusions have not significantly changed. Abnormal lung base opacity, mostly ground-glass and indistinct. Considering somewhat lower lung volumes this has probably not significantly changed from last month. Hepatobiliary: Small volume mostly inferior perihepatic free fluid, ascites. This is similar to 04/17/2024. Evidence of gallstones or sludge but stable and diminutive gallbladder. Noncontrast liver parenchyma appears negative aside from calcified hepatic vasculature. Pancreas: Negative. Spleen: Calcified vasculature otherwise negative. Adrenals/Urinary Tract: Native renal atrophy. Renal vascular calcifications. No hydronephrosis. Adrenal glands are difficult to discern but appears stable and normal. Decompressed bladder.  Stomach/Bowel: Limited detail due to paucity of intra-abdominal fat and absent contrast. No dilated bowel. No pneumoperitoneum identified. Small volume of abdominal ascites, most apparent along the inferior tip of the liver and in the right gutter. Mildly complex fluid density which is nonspecific. No obvious bowel inflammation. Vascular/Lymphatic: Diffuse severe calcified atherosclerosis. Vascular patency is not evaluated in the absence of IV contrast. Normal caliber abdominal aorta. No lymphadenopathy is evident. Reproductive: Negative noncontrast appearance. Other: Confluent pelvic ascites, increased from last month. Mildly complex fluid density no layering hematocrit. Musculoskeletal: Healing right posterior rib fractures again noted. Generalized abnormal sclerosis of bony structures, probably renal osteodystrophy. Femoral head chronic avascular necrosis would be difficult to exclude but there is no associated subchondral collapse. Stable visualized osseous structures. IMPRESSION: 1. Increased although nonspecific free fluid/Ascites since last month, especially layering in the pelvis now. Mildly complex fluid density, also nonspecific. 2. No other acute or inflammatory process identified in the noncontrast abdomen or pelvis, note limited noncontrast bowel detail due to paucity of visceral fat. 3. Stable severe cardiomegaly, generalized calcified atherosclerosis. Trace layering pleural effusions and patchy lung base ground-glass opacity not significantly changed from last month. 4. Native renal atrophy, evidence of renal osteodystrophy. Healing right posterior rib fractures. Electronically Signed   By: Marlise Simpers M.D.   On: 05/10/2024 06:07        Scheduled Meds:  acetaminophen   1,000 mg Oral QID   amiodarone   200 mg Oral Daily   [START ON 05/12/2024] Chlorhexidine  Gluconate Cloth  6 each Topical Q0600   cinacalcet   30 mg Oral QPM   fluticasone  2 spray Each Nare Daily   heparin   5,000 Units Subcutaneous  Q8H   midodrine   10 mg Oral TID WC   sevelamer  carbonate  1,600 mg Oral With snacks   sevelamer  carbonate  3,200 mg Oral TID WC   Continuous Infusions:  sodium chloride      anticoagulant sodium citrate        LOS: 2 days      Junita Oliva, MD [facility] 05/10/2024, 6:15 PM

## 2024-05-11 NOTE — Progress Notes (Signed)
 PROGRESS NOTE    [name]  ZOX:[id number] DOB: March 02, 1981 DOA: 05/08/2024 PCP: Senaida Dama, NP    Brief Narrative:  43 year old with ESRD on hemodialysis TTS is scheduled, history of lupus, prior polysubstance abuse, severe congestive heart failure and recurrent admissions presented to the emergency room with shortness of breath started Sunday after incomplete hemodialysis on Saturday.  At the emergency room increased work of breathing requiring 5 L of oxygen.  Found to have fluid overload.  Admitted and underwent urgent dialysis with improvement.  Subjective: Patient seen and examined following dialysis. She states that she feels very fatigued. No new complaints. She continues to feel short of breath.  Blood pressures immediately following dialysis are low normal at 101/58. Continue to monitor.  CT abdomen and pelvis performed to investigate complaints of abdominal pain demonstrated free flowing ascites that is increased over the past month. No other acute or inflammatory process is noted. The CT continues to demonstrate severe cardiomegaly and generalized calcified atherosclerosis. There was also trace layering pleural effusions as well as patchy lung base ground glass opacity that was unchanged from one month ago.   Assessment & Plan:   Fluid overload secondary to incomplete fluid removal on dialysis ESRD on hemodialysis TTS Acute hypoxic respite failure with respiratory distress:  Patient was taken to urgent hemodialysis 5/12 with improvement of symptoms.  Initially on CPAP and currently on room air. She has extensive chronic issues and limited fluid removal due to hypotension. Patient will need another dialysis under supervision in the hospital before transitioning to outpatient dialysis. Start mobilizing.  Nephrology following.  Chronic congestive heart failure: Known ejection fraction 30%.  Challenging to dialyze with hypotension.  Fluid balance through hemodialysis.   Today euvolemic.  Chronic A-flutter: Rate controlled.  On amiodarone .  Not on anticoagulation with history of intracranial bleeding.  Severe protein calorie malnutrition: Augment nutrition.         DVT prophylaxis: heparin  injection 5,000 Units Start: 05/08/24 1400 Patient refused heparin  on 05/10/2024.  Code Status: Full code Family Communication: None at the bedside Disposition Plan: Status is: Inpatient Remains inpatient appropriate because: Will need dialysis under supervision     Consultants:  Nephrology  Procedures:  Dialysis  Antimicrobials:  None     Objective: Vitals:   05/10/24 1619 05/10/24 2159 05/10/24 2321 05/11/24 0428  BP: (!) 101/58 (!) 83/46 (!) 92/52 (!) 91/52  Pulse:  98  (!) 101  Resp: 16 19  20   Temp: 98.1 F (36.7 C) (!) 97.4 F (36.3 C)  98 F (36.7 C)  TempSrc: Oral     SpO2: 100% 100%  99%  Weight:      Height:        Intake/Output Summary (Last 24 hours) at 05/11/2024 0600 Last data filed at 05/10/2024 1318 Gross per 24 hour  Intake 236 ml  Output 2.5 ml  Net 233.5 ml   Filed Weights   05/08/24 0419 05/10/24 1209  Weight: 39.7 kg 38.2 kg    Examination:  Exam:  Constitutional:  The patient is awake, alert, and oriented x 3. No acute distress. Respiratory:  No increased work of breathing. No wheezes, rales, or rhonchi No tactile fremitus Cardiovascular:  Regular rate and rhythm No murmurs, ectopy, or gallups. No lateral PMI. No thrills. Abdomen:  Abdomen is soft, non-tender, non-distended No hernias, masses, or organomegaly Normoactive bowel sounds.  Musculoskeletal:  No cyanosis, clubbing, or edema Fistula in right upper extremity. Cachexia Skin:  No rashes, lesions, ulcers palpation  of skin: no induration or nodules Neurologic:  CN 2-12 intact Sensation all 4 extremities intact Psychiatric:  Affect is flat and mood is depressed.  Data Reviewed: I have personally reviewed following labs and imaging  studies  CBC: Recent Labs  Lab 05/08/24 0455 05/08/24 0536 05/08/24 0538 05/08/24 1408 05/10/24 0845  WBC 6.2  --   --  6.6 7.2  NEUTROABS 4.5  --   --   --   --   HGB 12.3 12.6 12.9 12.9 10.8*  HCT 39.4 37.0 38.0 40.3 33.9*  MCV 91.0  --   --  88.2 86.7  PLT 186  --   --  238 251   Basic Metabolic Panel: Recent Labs  Lab 05/08/24 0455 05/08/24 0536 05/08/24 0538 05/08/24 1409 05/10/24 0844  NA 138 136 136 135 133*  K 5.1 4.9 4.9 4.5 5.8*  CL 93*  --  99 91* 92*  CO2 16*  --   --  25 22  GLUCOSE 70  --  65* 48* 80  BUN 50*  --  51* 28* 76*  CREATININE 5.44*  --  5.40* 3.31* 5.98*  CALCIUM  8.1*  --   --  8.6* 7.1*  MG 2.4  --   --   --   --   PHOS  --   --   --  4.2 7.6*   GFR: Estimated Creatinine Clearance: 7.5 mL/min (A) (by C-G formula based on SCr of 5.98 mg/dL (H)). Liver Function Tests: Recent Labs  Lab 05/08/24 0455 05/08/24 1409 05/10/24 0844  AST 32  --   --   ALT 19  --   --   ALKPHOS 265*  --   --   BILITOT 1.2  --   --   PROT 7.8  --   --   ALBUMIN  2.7* 2.9* 2.4*   No results for input(s): "LIPASE", "AMYLASE" in the last 168 hours. No results for input(s): "AMMONIA" in the last 168 hours. Coagulation Profile: No results for input(s): "INR", "PROTIME" in the last 168 hours. Cardiac Enzymes: No results for input(s): "CKTOTAL", "CKMB", "CKMBINDEX", "TROPONINI" in the last 168 hours. BNP (last 3 results) No results for input(s): "PROBNP" in the last 8760 hours. HbA1C: No results for input(s): "HGBA1C" in the last 72 hours. CBG: Recent Labs  Lab 05/08/24 0629 05/08/24 1950 05/09/24 0552 05/09/24 2143 05/10/24 0033  GLUCAP 65* 161* 75 86 84   Lipid Profile: No results for input(s): "CHOL", "HDL", "LDLCALC", "TRIG", "CHOLHDL", "LDLDIRECT" in the last 72 hours. Thyroid Function Tests: No results for input(s): "TSH", "T4TOTAL", "FREET4", "T3FREE", "THYROIDAB" in the last 72 hours. Anemia Panel: No results for input(s): "VITAMINB12",  "FOLATE", "FERRITIN", "TIBC", "IRON ", "RETICCTPCT" in the last 72 hours. Sepsis Labs: No results for input(s): "PROCALCITON", "LATICACIDVEN" in the last 168 hours.  No results found for this or any previous visit (from the past 240 hours).       Radiology Studies: CT ABDOMEN PELVIS WO CONTRAST Result Date: 05/10/2024 CLINICAL DATA:  43 year old female with right lower quadrant abdominal pain. History of heart failure, bacteremia, lupus, end-stage renal disease. EXAM: CT ABDOMEN AND PELVIS WITHOUT CONTRAST TECHNIQUE: Multidetector CT imaging of the abdomen and pelvis was performed following the standard protocol without IV contrast. RADIATION DOSE REDUCTION: This exam was performed according to the departmental dose-optimization program which includes automated exposure control, adjustment of the mA and/or kV according to patient size and/or use of iterative reconstruction technique. COMPARISON:  CT Abdomen and Pelvis with contrast  04/17/2024. FINDINGS: Lower chest: Severe cardiomegaly. No significant change from last month. No pericardial effusion. Trace mostly layering pleural effusions have not significantly changed. Abnormal lung base opacity, mostly ground-glass and indistinct. Considering somewhat lower lung volumes this has probably not significantly changed from last month. Hepatobiliary: Small volume mostly inferior perihepatic free fluid, ascites. This is similar to 04/17/2024. Evidence of gallstones or sludge but stable and diminutive gallbladder. Noncontrast liver parenchyma appears negative aside from calcified hepatic vasculature. Pancreas: Negative. Spleen: Calcified vasculature otherwise negative. Adrenals/Urinary Tract: Native renal atrophy. Renal vascular calcifications. No hydronephrosis. Adrenal glands are difficult to discern but appears stable and normal. Decompressed bladder. Stomach/Bowel: Limited detail due to paucity of intra-abdominal fat and absent contrast. No dilated bowel.  No pneumoperitoneum identified. Small volume of abdominal ascites, most apparent along the inferior tip of the liver and in the right gutter. Mildly complex fluid density which is nonspecific. No obvious bowel inflammation. Vascular/Lymphatic: Diffuse severe calcified atherosclerosis. Vascular patency is not evaluated in the absence of IV contrast. Normal caliber abdominal aorta. No lymphadenopathy is evident. Reproductive: Negative noncontrast appearance. Other: Confluent pelvic ascites, increased from last month. Mildly complex fluid density no layering hematocrit. Musculoskeletal: Healing right posterior rib fractures again noted. Generalized abnormal sclerosis of bony structures, probably renal osteodystrophy. Femoral head chronic avascular necrosis would be difficult to exclude but there is no associated subchondral collapse. Stable visualized osseous structures. IMPRESSION: 1. Increased although nonspecific free fluid/Ascites since last month, especially layering in the pelvis now. Mildly complex fluid density, also nonspecific. 2. No other acute or inflammatory process identified in the noncontrast abdomen or pelvis, note limited noncontrast bowel detail due to paucity of visceral fat. 3. Stable severe cardiomegaly, generalized calcified atherosclerosis. Trace layering pleural effusions and patchy lung base ground-glass opacity not significantly changed from last month. 4. Native renal atrophy, evidence of renal osteodystrophy. Healing right posterior rib fractures. Electronically Signed   By: Marlise Simpers M.D.   On: 05/10/2024 06:07        Scheduled Meds:  acetaminophen   1,000 mg Oral QID   amiodarone   200 mg Oral Daily   Chlorhexidine  Gluconate Cloth  6 each Topical Q0600   Chlorhexidine  Gluconate Cloth  6 each Topical Q0600   cinacalcet   30 mg Oral QPM   fluticasone  2 spray Each Nare Daily   heparin   5,000 Units Subcutaneous Q8H   midodrine   10 mg Oral TID WC   sevelamer  carbonate  2,400 mg  Oral TID WC   Continuous Infusions:  sodium chloride        LOS: 2 days      [name] Oliva, MD [facility] 05/10/2024, 6:15 PM

## 2024-05-11 NOTE — Progress Notes (Addendum)
   05/11/24 2330  Vitals  Temp 98.1 F (36.7 C)  Pulse Rate 88  Resp 13  BP (!) 94/50  SpO2 100 %  O2 Device Room Air  Post Treatment  Dialyzer Clearance Clear  Hemodialysis Intake (mL) 0 mL  Liters Processed 71.9  Fluid Removed (mL) 2000 mL  Tolerated HD Treatment Yes  AVG/AVF Arterial Site Held (minutes) 10 minutes  AVG/AVF Venous Site Held (minutes) 10 minutes   Received patient in bed to unit.  Alert and oriented.  Informed consent signed and in chart.   TX duration:3HRS  Patient tolerated well.  Transported back to the room  Alert, without acute distress.  Hand-off given to patient's nurse.   Access used: LUA AVF Access issues: ALBUMIN    Total UF removed: 2L Medication(s) given: NONE    [name]

## 2024-05-11 NOTE — Progress Notes (Signed)
 [facility] Progress Note  Subjective:    Vitals:   05/10/24 2159 05/10/24 2321 05/11/24 0428 05/11/24 0724  BP: (!) 83/46 (!) 92/52 (!) 91/52 (!) 88/46  Pulse: 98  (!) 101 (!) 103  Resp: 19  20   Temp: (!) 97.4 F (36.3 C)  98 F (36.7 C) 98.9 F (37.2 C)  TempSrc:      SpO2: 100%  99% 94%  Weight:      Height:        Exam: Gen alert, no distress, 2 L [place] O2, frail No jvd or bruits Chest mostly clear RRR no MRG Abd soft ntnd no mass or ascites Ext no LE edema Neuro is alert, Ox 3 , nf    RUE AVF+bruit        Renal-related home meds: Sensipar  30 at bedtime Midodrine  10 tid Renvela  2.4 gm ac tid Others: Oxy IR, Flonase, amiodarone       [facility] HD: [facility]  3h   B400   39.5kg   2K bath  R AVF   Heparin  none     Assessment/ Plan: Respiratory distress: w/ pulm edema, prob overload in esrd patient. Required bipap in ED. BP's always are soft. Had 3.3 L off 5/12 and 2.5 L off 5/14. O2 support has improved from bipap > 5L > 4L [place]. Not sure if on home O2. Next HD tonight or tomorrow, cont to lower volume.  ESRD: on HD TTS. HD here Monday, HD today off schedule.  BP: bp's soft 80s-100s.  Cont midodrine  10 tid Volume: not sig vol overloaded on exam. "Goes straight to my lungs" per the patient.   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 This is 4 admit in last 6 mos. Her outpt neph MD discussed EOL issues w/ pt and mother 3-4 wks ago; brought it up w/ patient here, but not sure she is accepting this very well. Have ordered palliative care consult.      Larry Poag MD  [facility] 05/11/2024, 12:26 PM  Recent Labs  Lab 05/08/24 1409 05/10/24 0844 05/10/24 0845 05/11/24 0608  HGB  --   --  10.8* 11.4*  ALBUMIN  2.9* 2.4*  --   --   CALCIUM  8.6* 7.1*  --  7.6*  PHOS 4.2 7.6*  --   --   CREATININE 3.31* 5.98*  --   4.91*  K 4.5 5.8*  --  4.7   No results for input(s): "IRON ", "TIBC", "FERRITIN" in the last 168 hours. Inpatient medications:  acetaminophen   1,000 mg Oral QID   amiodarone   200 mg Oral Daily   Chlorhexidine  Gluconate Cloth  6 each Topical Q0600   Chlorhexidine  Gluconate Cloth  6 each Topical Q0600   cinacalcet   30 mg Oral QPM   fluticasone  2 spray Each Nare Daily   heparin   5,000 Units Subcutaneous Q8H   midodrine   10 mg Oral TID WC   sevelamer  carbonate  1,600 mg Oral With snacks   sevelamer  carbonate  3,200 mg Oral TID WC    sodium chloride      sodium chloride , hydrOXYzine , LORazepam , oxyCODONE 

## 2024-05-11 NOTE — Progress Notes (Signed)
 TX Start

## 2024-05-11 NOTE — Progress Notes (Signed)
Pre hd 

## 2024-05-11 NOTE — Progress Notes (Signed)
 [facility] night cross cover note:   Per patient request, I added prn PO atarax  for itching.     Camelia Cavalier, DO Hospitalist

## 2024-05-12 ENCOUNTER — Ambulatory Visit: Admitting: Physical Therapy

## 2024-05-12 DIAGNOSIS — J9601 Acute respiratory failure with hypoxia: Secondary | ICD-10-CM | POA: Diagnosis not present

## 2024-05-12 MED ORDER — ACETAMINOPHEN 500 MG PO TABS: 1000.0000 mg | ORAL_TABLET | Freq: Four times a day (QID) | ORAL | 0 refills | Status: DC

## 2024-05-12 MED ORDER — SEVELAMER CARBONATE 800 MG PO TABS: 3200.0000 mg | ORAL_TABLET | Freq: Three times a day (TID) | ORAL | 0 refills | Status: DC

## 2024-05-12 MED ORDER — MIDODRINE HCL 10 MG PO TABS: 10.0000 mg | ORAL_TABLET | Freq: Three times a day (TID) | ORAL | 0 refills | Status: DC

## 2024-05-12 MED ORDER — SEVELAMER CARBONATE 800 MG PO TABS
1600.0000 mg | ORAL_TABLET | ORAL | 0 refills | Status: DC
Start: 2024-05-12 — End: 2024-05-19

## 2024-05-12 NOTE — Discharge Planning (Signed)
 [facility] Patient Discharge Orders - [facility] CLINIC: [facility]  Patient's name: [name] Admit/DC Dates: 05/08/2024 - 05/12/2024  DISCHARGE DIAGNOSES: Acute hypoxic respiratory failure second to volume overload   ESRD on HD  HD ORDER CHANGES: Heparin  change: no heparin  EDW Change: yes New EDW: 38.2 kg Bath Change: no change  ANEMIA MANAGEMENT: Aranesp : Given: no   ESA dose for discharge: no ESA due to Hgb 11.4 IV Iron  dose at discharge: per protocol Transfusion: Given: n/a  BONE/MINERAL MEDICATIONS: Hectorol /Calcitriol  change: per protocol Sensipar /Parsabiv change: per protocol  ACCESS INTERVENTION/CHANGE: no change Details: RUA AVF   RECENT LABS: Recent Labs  Lab 05/10/24 0844 05/10/24 0845 05/11/24 0608  HGB  --    < > 11.4*  NA 133*  --  133*  K 5.8*  --  4.7  CALCIUM  7.1*  --  7.6*  PHOS 7.6*  --   --   ALBUMIN  2.4*  --   --    < > = values in this interval not displayed.    IV ANTIBIOTICS: no Details:  OTHER ANTICOAGULATION: On Coumadin?: no Last INR: Managed By:  OTHER/APPTS/LAB ORDERS: Please draw updated labs at treatment  D/C Meds to be reconciled by nurse after every discharge.  Completed By: Hersey Lorenzo, NP-C   Reviewed by: MD:______ RN_______

## 2024-05-12 NOTE — Progress Notes (Signed)
 [facility] Progress Note  Subjective:  Seen in room Had HD last night w/ 2 L off  Vitals:   05/12/24 0356 05/12/24 0500 05/12/24 0629 05/12/24 0815  BP: (!) 82/42 (!) 84/50 (!) 96/57 (!) 91/44  Pulse: 96   99  Resp: 18   19  Temp: 97.6 F (36.4 C)   98.1 F (36.7 C)  TempSrc:      SpO2: 95%   98%  Weight:      Height:        Exam: Gen alert, no distress, 2 L Eva O2, frail No jvd or bruits Chest mostly clear RRR no MRG Abd soft ntnd no mass or ascites Ext no LE edema Neuro is alert, Ox 3 , nf    RUE AVF+bruit        Renal-related home meds: Sensipar  30 at bedtime Midodrine  10 tid Renvela  2.4 gm ac tid Others: Oxy IR, Flonase, amiodarone       [facility]  [facility]   B400   39.5kg   2K bath  R AVF   Heparin  none     Assessment/ Plan: Respiratory distress: w/ pulm edema, prob overload in esrd patient. Required bipap in ED. BP's always are soft. Had 3.3 L off 5/12 and 2.5 L off 5/14. O2 support has improved from bipap > 4L [place] to room air today, after 3 BP sessions here. Get standing wt prior to dc. Ok to dc from renal standpoint.  ESRD: on HD TTS. Had HD here x 3, including yesterday evening.  BP: bp's soft 80s-100s.  Cont midodrine  10 tid Volume: not sig vol overloaded on exam. "Goes straight to my lungs" per the patient. She is off O2 today, need a good wt prior to dc.  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 This is 4th admit in last 6 mos. Her outpt neph MD discussed EOL issues w/ pt and mother 3-4 wks ago. I brought it up w/ patient here but she was not very receptive. Will cont to work on this in the OP setting.      Larry Poag MD  [facility] 05/12/2024, 11:47 AM  Recent Labs  Lab 05/08/24 1409 05/10/24 0844 05/10/24 0845 05/11/24 0608  HGB  --   --  10.8* 11.4*  ALBUMIN  2.9* 2.4*  --   --    CALCIUM  8.6* 7.1*  --  7.6*  PHOS 4.2 7.6*  --   --   CREATININE 3.31* 5.98*  --  4.91*  K 4.5 5.8*  --  4.7   No results for input(s): "IRON ", "TIBC", "FERRITIN" in the last 168 hours. Inpatient medications:  acetaminophen   1,000 mg Oral QID   amiodarone   200 mg Oral Daily   Chlorhexidine  Gluconate Cloth  6 each Topical Q0600   cinacalcet   30 mg Oral QPM   fluticasone  2 spray Each Nare Daily   heparin   5,000 Units Subcutaneous Q8H   midodrine   10 mg Oral TID WC   sevelamer  carbonate  1,600 mg Oral With snacks   sevelamer  carbonate  3,200 mg Oral TID WC    sodium chloride      sodium chloride , hydrOXYzine , LORazepam , oxyCODONE 

## 2024-05-12 NOTE — Progress Notes (Signed)
 D/C noted. Contacted [facility] to be advised of pt's d/c today and that pt should resume care tomorrow.   Lauraine Polite Renal Navigator [phone]

## 2024-05-12 NOTE — Discharge Summary (Addendum)
 Physician Discharge Summary   Patient: [name] MRN: [id number] DOB: 07-Aug-1981  Admit date:     05/08/2024  Discharge date: 05/12/24  Discharge Physician: Junita Oliva   PCP: Senaida Dama, NP   Recommendations at discharge:    Discharge to home Follow up with PCP in 7-10 days. Keep dialysis schedule Follow up with nephrology as directed.  Discharge Diagnoses: Principal Problem:   Acute hypoxemic respiratory failure (HCC) Active Problems:   Fluid overload  Resolved Problems:   * No resolved hospital problems. *  Hospital Course: 43 year old with ESRD on hemodialysis TTS is scheduled, history of lupus, prior polysubstance abuse, severe congestive heart failure and recurrent admissions presented to the emergency room with shortness of breath started Sunday after incomplete hemodialysis on Saturday. At the emergency room increased work of breathing requiring 5 L of oxygen. Found to have fluid overload. Admitted and underwent urgent dialysis with improvement.   On 05/12/2024 the patient had achieved her dry weight. She has been cleared for discharge by Dr. Schertz. She will be discharged to home today in fair condition.  Assessment and Plan:  Fluid overload secondary to incomplete fluid removal on dialysis ESRD on hemodialysis TTS Acute hypoxic respite failure with respiratory distress:   Patient was taken to urgent hemodialysis 5/12 with improvement of symptoms.  Initially on CPAP and currently on room air. She has extensive chronic issues and limited fluid removal due to hypotension. Patient will need another dialysis under supervision in the hospital before transitioning to outpatient dialysis. Start mobilizing.  Nephrology following.   Chronic congestive heart failure: Known ejection fraction 30%.  Challenging to dialyze with hypotension.  Fluid balance through hemodialysis.  Today euvolemic.   Chronic A-flutter: Rate controlled.  On amiodarone.  Not on  anticoagulation with history of intracranial bleeding.  Mild metabolic acidosis due to ESRD.   Severe protein calorie malnutrition: Nutrition consulted.   Augment nutrition.       Consultants: Nephrology Procedures performed: Dialysis  Disposition: Home Diet recommendation:  Discharge Diet Orders (From admission, onward)     Start     Ordered   05/12/24 0000  Diet - low sodium heart healthy        05 /16/25 1411           Renal diet DISCHARGE MEDICATION: Allergies as of 05/12/2024       Reactions   Cephalosporins Rash, Other (See Comments)   To both keflex  and cefazolin .  Potential bullous lesion from Ceftriaxone  11/2023.   Tobramycin Sulfate Swelling   Eye swelling   Vancomycin  Swelling        Medication List     STOP taking these medications    fluticasone  50 MCG/ACT nasal spray Commonly known as: FLONASE        TAKE these medications    acetaminophen  500 MG tablet Commonly known as: TYLENOL  Take 2 tablets (1,000 mg total) by mouth 4 (four) times daily.   amiodarone  200 MG tablet Commonly known as: PACERONE  Take 1 tablet (200 mg total) by mouth daily.   cinacalcet  30 MG tablet Commonly known as: SENSIPAR  Take 1 tablet (30 mg total) by mouth every evening.   HECTOROL  IV Inject 1 mcg into the vein See admin instructions. On dialysis days (Tuesday, Thursday, Saturday)   LOPERAMIDE  HCL PO Take 4 mg by mouth See admin instructions. As needed on dialysis days   midodrine  10 MG tablet Commonly known as: PROAMATINE  Take 1 tablet (10 mg total) by mouth 3 (three) times daily  with meals.   MIRCERA IJ Inject 30 mcg into the vein every 28 (twenty-eight) days.   oxyCODONE  5 MG immediate release tablet Commonly known as: Oxy IR/ROXICODONE  Take 1 tablet (5 mg total) by mouth 2 (two) times daily as needed for severe pain (pain score 7-10).   sevelamer  carbonate 800 MG tablet Commonly known as: RENVELA  Take 4 tablets (3,200 mg total) by mouth 3  (three) times daily with meals. What changed:  how much to take when to take this   sevelamer  carbonate 800 MG tablet Commonly known as: RENVELA  Take 2 tablets (1,600 mg total) by mouth with snacks. What changed: You were already taking a medication with the same name, and this prescription was added. Make sure you understand how and when to take each.        Discharge Exam: Filed Weights   05/08/24 0419 05/10/24 1209  Weight: 39.7 kg 38.2 kg   Exam:  Constitutional:  The patient is awake, alert, and oriented x 3. No acute distress. Respiratory:  No increased work of breathing. No wheezes, rales, or rhonchi No tactile fremitus Cardiovascular:  Regular rate and rhythm No murmurs, ectopy, or gallups. No lateral PMI. No thrills. Abdomen:  Abdomen is soft, non-tender, non-distended No hernias, masses, or organomegaly Normoactive bowel sounds.  Musculoskeletal:  No cyanosis, clubbing, or edema Cachectic Skin:  No rashes, lesions, ulcers palpation of skin: no induration or nodules Neurologic:  CN 2-12 intact Sensation all 4 extremities intact Psychiatric:  Mental status Mood, affect appropriate Orientation to person, place, time  judgment and insight appear intact   Condition at discharge: fair  The results of significant diagnostics from this hospitalization (including imaging, microbiology, ancillary and laboratory) are listed below for reference.   Imaging Studies: CT ABDOMEN PELVIS WO CONTRAST Result Date: 05/10/2024 CLINICAL DATA:  43 year old female with right lower quadrant abdominal pain. History of heart failure, bacteremia, lupus, end-stage renal disease. EXAM: CT ABDOMEN AND PELVIS WITHOUT CONTRAST TECHNIQUE: Multidetector CT imaging of the abdomen and pelvis was performed following the standard protocol without IV contrast. RADIATION DOSE REDUCTION: This exam was performed according to the departmental dose-optimization program which includes automated  exposure control, adjustment of the mA and/or kV according to patient size and/or use of iterative reconstruction technique. COMPARISON:  CT Abdomen and Pelvis with contrast 04/17/2024. FINDINGS: Lower chest: Severe cardiomegaly. No significant change from last month. No pericardial effusion. Trace mostly layering pleural effusions have not significantly changed. Abnormal lung base opacity, mostly ground-glass and indistinct. Considering somewhat lower lung volumes this has probably not significantly changed from last month. Hepatobiliary: Small volume mostly inferior perihepatic free fluid, ascites. This is similar to 04/17/2024. Evidence of gallstones or sludge but stable and diminutive gallbladder. Noncontrast liver parenchyma appears negative aside from calcified hepatic vasculature. Pancreas: Negative. Spleen: Calcified vasculature otherwise negative. Adrenals/Urinary Tract: Native renal atrophy. Renal vascular calcifications. No hydronephrosis. Adrenal glands are difficult to discern but appears stable and normal. Decompressed bladder. Stomach/Bowel: Limited detail due to paucity of intra-abdominal fat and absent contrast. No dilated bowel. No pneumoperitoneum identified. Small volume of abdominal ascites, most apparent along the inferior tip of the liver and in the right gutter. Mildly complex fluid density which is nonspecific. No obvious bowel inflammation. Vascular/Lymphatic: Diffuse severe calcified atherosclerosis. Vascular patency is not evaluated in the absence of IV contrast. Normal caliber abdominal aorta. No lymphadenopathy is evident. Reproductive: Negative noncontrast appearance. Other: Confluent pelvic ascites, increased from last month. Mildly complex fluid density no layering hematocrit. Musculoskeletal: Healing  right posterior rib fractures again noted. Generalized abnormal sclerosis of bony structures, probably renal osteodystrophy. Femoral head chronic avascular necrosis would be difficult  to exclude but there is no associated subchondral collapse. Stable visualized osseous structures. IMPRESSION: 1. Increased although nonspecific free fluid/Ascites since last month, especially layering in the pelvis now. Mildly complex fluid density, also nonspecific. 2. No other acute or inflammatory process identified in the noncontrast abdomen or pelvis, note limited noncontrast bowel detail due to paucity of visceral fat. 3. Stable severe cardiomegaly, generalized calcified atherosclerosis. Trace layering pleural effusions and patchy lung base ground-glass opacity not significantly changed from last month. 4. Native renal atrophy, evidence of renal osteodystrophy. Healing right posterior rib fractures. Electronically Signed   By: Marlise Simpers M.D.   On: 05/10/2024 06:07   DG Chest Portable 1 View Result Date: 05/08/2024 CLINICAL DATA:  Shortness of breath EXAM: PORTABLE CHEST 1 VIEW COMPARISON:  04/17/2024 FINDINGS: Chronic cardiopericardial enlargement. Confluent interstitial opacity with hazy density and probable left pleural effusion. No pneumothorax. 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 Extensive artifact from EKG leads. IMPRESSION: Generalized opacity favoring edema. Electronically Signed   By: Ronnette Coke M.D.   On: 05/08/2024 05:39   CT ABDOMEN PELVIS W CONTRAST Result Date: 04/17/2024 CLINICAL DATA:  Abdominal pain, acute, nonlocalized EXAM: CT ABDOMEN AND PELVIS WITH CONTRAST TECHNIQUE: Multidetector CT imaging of the abdomen and pelvis was performed using the standard protocol following bolus administration of intravenous contrast. RADIATION DOSE REDUCTION: This exam was performed according to the departmental dose-optimization program which includes automated exposure control, adjustment of the mA and/or kV according to patient size and/or use of iterative reconstruction technique. CONTRAST:  75mL OMNIPAQUE  IOHEXOL  350 MG/ML SOLN COMPARISON:  December 15, 2023 FINDINGS: Lower chest: For  findings above the diaphragm, please see the separately dictated CT of the chest report, which was performed concurrently. Hepatobiliary: No mass. Reflux of contrast into the hepatic veins. Heterogeneous enhancement of the left hepatic lobe and inferior right hepatic tip.No radiopaque stones or wall thickening of the gallbladder.No intrahepatic or extrahepatic biliary ductal dilation. Pancreas: No mass or main ductal dilation.No peripancreatic inflammation or fluid collection. Spleen: No mass. Heterogeneous enhancement throughout the subcapsular spleen, likely due to the phase of contrast. Adrenals/Urinary Tract: No adrenal masses. Severe renal atrophy bilaterally. No mass. No nephrolithiasis or hydronephrosis. Decompressed urinary bladder without visualized focal abnormality. Stomach/Bowel: The stomach is decompressed without focal abnormality. No small bowel wall thickening or inflammation. No small bowel obstruction. Normal appendix. Vascular/Lymphatic: No aortic aneurysm. No intraabdominal or pelvic lymphadenopathy. Reproductive: The uterus and ovaries are within normal limits for patient's age.No free pelvic fluid. Other: No pneumoperitoneum. Small volume ascites with diffuse mesenteric edema. Musculoskeletal: No acute fracture or destructive lesion.Renal osteodystrophy changes. 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 IMPRESSION: 1. No acute intra-abdominal or pelvic abnormality. 2. Small volume ascites with diffuse mesenteric edema, likely related to the patient's volume status. 3. Reflux of contrast into the hepatic veins, consistent with underlying cardiac dysfunction. Heterogeneous enhancement of the left hepatic lobe and inferior right hepatic tip is also likely related to the cardiac dysfunction (congestive hepatopathy). Correlation with liver enzymes recommended to exclude acute hepatitis. Electronically Signed   By: Rance Burrows M.D.   On: 04/17/2024 12:04   CT Angio Chest PE W  and/or Wo Contrast Result Date: 04/17/2024 CLINICAL DATA:  Pulmonary embolism (PE) suspected, high prob EXAM: CT ANGIOGRAPHY CHEST WITH CONTRAST TECHNIQUE: Multidetector CT imaging of the chest was performed using the standard  protocol during bolus administration of intravenous contrast. Multiplanar CT image reconstructions and MIPs were obtained to evaluate the vascular anatomy. RADIATION DOSE REDUCTION: This exam was performed according to the departmental dose-optimization program which includes automated exposure control, adjustment of the mA and/or kV according to patient size and/or use of iterative reconstruction technique. CONTRAST:  75mL OMNIPAQUE  IOHEXOL  350 MG/ML SOLN COMPARISON:  January 08, 2024 FINDINGS: Pulmonary Embolism: The segmental and subsegmental branches of the left lower lobe are not well opacified by the contrast bolus. Otherwise, no visualized pulmonary embolus in the remaining pulmonary arteries. Cardiovascular: Severe cardiomegaly. No aortic aneurysm. Diffuse aortic atherosclerosis. Extensive multi-vessel coronary atherosclerosis. Mediastinum/Nodes: No mediastinal mass. Enlarged multi station mediastinal and bilateral hilar lymph nodes, unchanged, likely reactive. Lungs/Pleura: The trachea is midline and patent. Mild diffuse bronchial wall thickening with interlobular septal thickening throughout the lungs. No lobar airspace consolidation or pneumothorax. Small bilateral pleural effusions with hazy ground-glass attenuation throughout the lungs. 4 mm right lower lobe nodule (axial 88), unchanged. Musculoskeletal: No acute fracture or destructive bone lesion. Renal osteodystrophy changes. Multilevel thoracic osteophytosis. Upper Abdomen: For findings below the diaphragm, please see the separately dictated CT of the abdomen and pelvis report, which was performed concurrently. Review of the MIP images confirms the above findings. IMPRESSION: 1. The segmental and subsegmental branches of the  left lower lobe are not well opacified by the contrast bolus. Otherwise, no visualized pulmonary embolus in the remaining pulmonary arteries. 2. Severe cardiomegaly, unchanged, with superimposed findings of pulmonary edema. Small bilateral pleural effusions. 3. Right lower lobe pulmonary nodule measuring 4 mm, unchanged. Follow-up could be considered, as documented below. 4. For findings below the diaphragm, please see the separately dictated CT of the abdomen and pelvis report, which was performed concurrently. No follow-up needed if patient is low-risk.This recommendation follows the consensus statement: Guidelines for Management of Incidental Pulmonary Nodules Detected on CT Images: From the Fleischner Society 2017; Radiology 2017; 284:228-243. Aortic Atherosclerosis (ICD10-I70.0). Electronically Signed   By: Rance Burrows M.D.   On: 04/17/2024 11:49   DG Chest 2 View Result Date: 04/17/2024 CLINICAL DATA:  Shortness of breath EXAM: CHEST - 2 VIEW COMPARISON:  01/23/2024 FINDINGS: Unchanged cardiac enlargement. Small bilateral pleural effusions, left greater than right. Diffuse increase interstitial markings noted bilaterally. Bibasilar atelectasis. Visualized osseous structures appear intact. IMPRESSION: Cardiomegaly, small bilateral pleural effusions and diffuse increase interstitial markings compatible with CHF. Electronically Signed   By: Kimberley Penman M.D.   On: 04/17/2024 06:20    Microbiology: Results for orders placed or performed during the hospital encounter of 04/17/24  Resp panel by RT-PCR (RSV, Flu A&B, Covid) Anterior Nasal Swab     Status: None   Collection Time: 04/17/24  5:38 AM   Specimen: Anterior Nasal Swab  Result Value Ref Range Status   SARS Coronavirus 2 by RT PCR NEGATIVE NEGATIVE Final   Influenza A by PCR NEGATIVE NEGATIVE Final   Influenza B by PCR NEGATIVE NEGATIVE Final    Comment: (NOTE) The Xpert Xpress SARS-CoV-2/FLU/RSV plus assay is intended as an aid in the  diagnosis of influenza from Nasopharyngeal swab specimens and should not be used as a sole basis for treatment. Nasal washings and aspirates are unacceptable for Xpert Xpress SARS-CoV-2/FLU/RSV testing.  Fact Sheet for Patients: BloggerCourse.com  Fact Sheet for Healthcare Providers: SeriousBroker.it  This test is not yet approved or cleared by the [place]  FDA and has been authorized for detection and/or diagnosis of SARS-CoV-2 by FDA under an Emergency Use  Authorization (EUA). This EUA will remain in effect (meaning this test can be used) for the duration of the COVID-19 declaration under Section 564(b)(1) of the Act, 21 U.S.C. section 360bbb-3(b)(1), unless the authorization is terminated or revoked.     Resp Syncytial Virus by PCR NEGATIVE NEGATIVE Final    Comment: (NOTE) Fact Sheet for Patients: BloggerCourse.com  Fact Sheet for Healthcare Providers: SeriousBroker.it  This test is not yet approved or cleared by the [place]  FDA and has been authorized for detection and/or diagnosis of SARS-CoV-2 by FDA under an Emergency Use Authorization (EUA). This EUA will remain in effect (meaning this test can be used) for the duration of the COVID-19 declaration under Section 564(b)(1) of the Act, 21 U.S.C. section 360bbb-3(b)(1), unless the authorization is terminated or revoked.  Performed at [facility], [address], [place], [place] [address]   MRSA Next Gen by PCR, Nasal     Status: Abnormal   Collection Time: 04/19/24  4:47 PM   Specimen: Nasal Mucosa; Nasal Swab  Result Value Ref Range Status   MRSA by PCR Next Gen DETECTED (A) NOT DETECTED Final    Comment: RESULT CALLED TO, READ BACK BY AND VERIFIED WITH: RN NICOLE ATKINS ON 04/19/24 @ 2019 BY DRT (NOTE) The GeneXpert MRSA Assay (FDA approved for NASAL specimens only), is one component of a  comprehensive MRSA colonization surveillance program. It is not intended to diagnose MRSA infection nor to guide or monitor treatment for MRSA infections. Test performance is not FDA approved in patients less than 70 years old. Performed at [facility], [address], [place], [place] [address]     Labs: CBC: Recent Labs  Lab 05/08/24 [phone] 05/08/24 0536 05/08/24 0538 05/08/24 1408 05/10/24 0845 05/11/24 0608  WBC 6.2  --   --  6.6 7.2 6.7  NEUTROABS 4.5  --   --   --   --  4.7  HGB 12.3 12.6 12.9 12.9 10.8* 11.4*  HCT 39.4 37.0 38.0 40.3 33.9* 35.4*  MCV 91.0  --   --  88.2 86.7 88.5  PLT 186  --   --  238 251 211   Basic Metabolic Panel: Recent Labs  Lab 05/08/24 0455 05/08/24 0536 05/08/24 0538 05/08/24 1409 05/10/24 0844 05/11/24 0608  NA 138 136 136 135 133* 133*  K 5.1 4.9 4.9 4.5 5.8* 4.7  CL 93*  --  99 91* 92* 90*  CO2 16*  --   --  25 22 26   GLUCOSE 70  --  65* 48* 80 70  BUN 50*  --  51* 28* 76* 60*  CREATININE 5.44*  --  5.40* 3.31* 5.98* 4.91*  CALCIUM  8.1*  --   --  8.6* 7.1* 7.6*  MG 2.4  --   --   --   --   --   PHOS  --   --   --  4.2 7.6*  --    Liver Function Tests: Recent Labs  Lab 05/08/24 0455 05/08/24 1409 05/10/24 0844  AST 32  --   --   ALT 19  --   --   ALKPHOS 265*  --   --   BILITOT 1.2  --   --   PROT 7.8  --   --   ALBUMIN  2.7* 2.9* 2.4*   CBG: Recent Labs  Lab 05/08/24 0629 05/08/24 1950 05/09/24 0552 05/09/24 2143 05/10/24 0033  GLUCAP 65* 161* 75 86 84  Discharge time spent: greater than 30 minutes.  Signed: Laaibah Wartman, DO [facility] 05/12/2024

## 2024-05-12 NOTE — Plan of Care (Signed)

## 2024-05-14 ENCOUNTER — Telehealth: Payer: Self-pay | Admitting: Physician Assistant

## 2024-05-14 NOTE — Telephone Encounter (Signed)
 Transition of care contact from inpatient facility  Date of Discharge: 05/12/24 Date of Contact: 05/14/24 Method of contact: Phone  Attempted to contact patient to discuss transition of care from inpatient admission. Patient did not answer the phone. Name of voicemail was not patient's so no message was left.  Ramona Burner, PA-C 05/14/2024, 1:06 PM  [facility] Pager: [phone]

## 2024-05-15 ENCOUNTER — Telehealth: Payer: Self-pay | Admitting: *Deleted

## 2024-05-15 NOTE — Transitions of Care (Post Inpatient/ED Visit) (Signed)
   05/15/2024  Name: [name] MRN: [id number] DOB: 1981-11-17  Today's TOC FU Call Status: Today's TOC FU Call Status:: Unsuccessful Call (1st Attempt) Unsuccessful Call (1st Attempt) Date: 05/15/24  Attempted to reach the patient regarding the most recent Inpatient/ED visit.  Follow Up Plan: Additional outreach attempts will be made to reach the patient to complete the Transitions of Care (Post Inpatient/ED visit) call.   Arna Better RN, BSN [place]  [facility] RN Care Manager [phone]

## 2024-05-16 ENCOUNTER — Telehealth: Payer: Self-pay | Admitting: *Deleted

## 2024-05-16 NOTE — Transitions of Care (Post Inpatient/ED Visit) (Signed)
   05/16/2024  Name: [name] MRN: [id number] DOB: [birth year]  Today's TOC FU Call Status: Today's TOC FU Call Status:: Unsuccessful Call (2nd Attempt) Unsuccessful Call (2nd Attempt) Date: 05/16/24  Attempted to reach the patient regarding the most recent Inpatient/ED visit.  Follow Up Plan: Additional outreach attempts will be made to reach the patient to complete the Transitions of Care (Post Inpatient/ED visit) call.   Arna Better RN, BSN [place]  [facility] RN Care Manager [phone]

## 2024-05-17 ENCOUNTER — Telehealth: Payer: Self-pay | Admitting: *Deleted

## 2024-05-17 NOTE — Transitions of Care (Post Inpatient/ED Visit) (Signed)
 05/17/2024  Name: [name] MRN: [id number] DOB: [birth year]  Today's TOC FU Call Status: Today's TOC FU Call Status:: Successful TOC FU Call Completed TOC FU Call Complete Date: 05/17/24 Patient's Name and Date of Birth confirmed.  Transition Care Management Follow-up Telephone Call Date of Discharge: 05/12/24 Discharge Facility: [facility]) Type of Discharge: Inpatient Admission Primary Inpatient Discharge Diagnosis:: Acute hypoxemic respiratory failure How have you been since you were released from the hospital?: Same Any questions or concerns?: Yes Patient Questions/Concerns:: Patient needs to schedule a hospital follow up with PCP Patient Questions/Concerns Addressed: Other: (RNCM assisted with scheduling hospital follow up with PCP)  Items Reviewed: Did you receive and understand the discharge instructions provided?: Yes Medications obtained,verified, and reconciled?: Yes (Medications Reviewed) Any new allergies since your discharge?: No Dietary orders reviewed?: Yes Type of Diet Ordered:: low sodium heart healthy Do you have support at home?: Yes People in Home [RPT]: parent(s) Name of Support/Comfort Primary Source: Mother/Brenda  Medications Reviewed Today: Medications Reviewed Today     Reviewed by Aura Leeds, RN (Registered Nurse) on 05/17/24 at 1106  Med List Status: <None>   Medication Order Taking? Sig Documenting Provider Last Dose Status Informant  acetaminophen  (TYLENOL ) 500 MG tablet [id number] Yes Take 2 tablets (1,000 mg total) by mouth 4 (four) times daily. Swayze, Ava, DO Taking Active   amiodarone  (PACERONE ) 200 MG tablet [id number] Yes Take 1 tablet (200 mg total) by mouth daily. Vada Garibaldi, MD Taking Active Self, Pharmacy Records  cinacalcet  (SENSIPAR ) 30 MG tablet [id number] Yes Take 1 tablet (30 mg total) by mouth every evening. Vada Garibaldi, MD Taking Active Self, Pharmacy Records  Doxercalciferol  (HECTOROL  IV) [id number] Yes Inject 1  mcg into the vein See admin instructions. On dialysis days (Tuesday, Thursday, Saturday) [provider] Taking Active Self, Pharmacy Records  LOPERAMIDE  HCL PO [id number] Yes Take 4 mg by mouth See admin instructions. As needed on dialysis days [provider] Taking Active Self, Pharmacy Records  Methoxy PEG-Epoetin Beta [facility]) [id number] Yes Inject 30 mcg into the vein every 28 (twenty-eight) days. [provider] Taking Active Self, Pharmacy Records  midodrine  (PROAMATINE ) 10 MG tablet [id number] Yes Take 1 tablet (10 mg total) by mouth 3 (three) times daily with meals. Swayze, Ava, DO Taking Active   oxyCODONE  (OXY IR/ROXICODONE ) 5 MG immediate release tablet [id number] No Take 1 tablet (5 mg total) by mouth 2 (two) times daily as needed for severe pain (pain score 7-10).  Patient not taking: Reported on 05/17/2024   Vada Garibaldi, MD Not Taking Active Self, Pharmacy Records  sevelamer  carbonate (RENVELA ) 800 MG tablet [id number] Yes Take 4 tablets (3,200 mg total) by mouth 3 (three) times daily with meals. Swayze, Ava, DO Taking Active   sevelamer  carbonate (RENVELA ) 800 MG tablet [id number] Yes Take 2 tablets (1,600 mg total) by mouth with snacks. Junita Oliva, DO Taking Active   Med List Note (Robinson, Taylor H, CPhT 01/24/24 2355): Dialysis Tuesday, Thursday, Saturday - [address] [facility] [address] [facility] [phone]            Home Care and Equipment/Supplies: Were Home Health Services Ordered?: No Any new equipment or medical supplies ordered?: Yes (shower seat) Name of Medical supply agency?: Received at discharge Were you able to get the equipment/medical supplies?: Yes Do you have any questions related to the use of the equipment/supplies?: No  Functional Questionnaire: Do you need assistance with bathing/showering or dressing?: No Do you need assistance with  meal preparation?: No Do you need assistance with eating?: No Do you have  difficulty maintaining continence: No Do you need assistance with getting out of bed/getting out of a chair/moving?: No Do you have difficulty managing or taking your medications?: No  Follow up appointments reviewed: PCP Follow-up appointment confirmed?: No (Collaborated with Care Guide to secure hospital follow up on 05/31/24-first available on a non dialysis day) MD Provider Line Number:[phone] Given: No Specialist Hospital Follow-up appointment confirmed?: Yes Date of Specialist follow-up appointment?: 05/16/24 Follow-Up Specialty Provider:: Dr. Cindra Cree, Nephrology Do you need transportation to your follow-up appointment?: No Do you understand care options if your condition(s) worsen?: Yes-patient verbalized understanding  SDOH Interventions Today    Flowsheet Row Most Recent Value  SDOH Interventions   Food Insecurity Interventions Intervention Not Indicated  Housing Interventions Intervention Not Indicated  Transportation Interventions Intervention Not Indicated  Utilities Interventions Intervention Not Indicated      Patient unable to complete Assessment. She was driving, arrived at her destination and needed to get off of the phone.  Arna Better RN, BSN [place]  [facility] RN Care Manager [phone]

## 2024-05-18 ENCOUNTER — Other Ambulatory Visit: Payer: Self-pay

## 2024-05-18 ENCOUNTER — Inpatient Hospital Stay (HOSPITAL_COMMUNITY)
Admission: EM | Admit: 2024-05-18 | Discharge: 2024-05-22 | DRG: 291 | Disposition: A | Discharge status: Home / Self Care | Attending: Internal Medicine | Admitting: Internal Medicine

## 2024-05-18 ENCOUNTER — Encounter (HOSPITAL_COMMUNITY): Payer: Self-pay

## 2024-05-18 ENCOUNTER — Emergency Department (HOSPITAL_COMMUNITY)

## 2024-05-18 DIAGNOSIS — R64 Cachexia: Secondary | ICD-10-CM | POA: Diagnosis present

## 2024-05-18 DIAGNOSIS — E8770 Fluid overload, unspecified: Secondary | ICD-10-CM | POA: Diagnosis present

## 2024-05-18 DIAGNOSIS — F419 Anxiety disorder, unspecified: Secondary | ICD-10-CM | POA: Diagnosis present

## 2024-05-18 DIAGNOSIS — D631 Anemia in chronic kidney disease: Secondary | ICD-10-CM | POA: Diagnosis present

## 2024-05-18 DIAGNOSIS — E162 Hypoglycemia, unspecified: Secondary | ICD-10-CM | POA: Diagnosis present

## 2024-05-18 DIAGNOSIS — I050 Rheumatic mitral stenosis: Secondary | ICD-10-CM | POA: Diagnosis present

## 2024-05-18 DIAGNOSIS — N186 End stage renal disease: Secondary | ICD-10-CM | POA: Diagnosis present

## 2024-05-18 DIAGNOSIS — I5A Non-ischemic myocardial injury (non-traumatic): Secondary | ICD-10-CM | POA: Diagnosis present

## 2024-05-18 DIAGNOSIS — N2581 Secondary hyperparathyroidism of renal origin: Secondary | ICD-10-CM | POA: Diagnosis present

## 2024-05-18 DIAGNOSIS — I5023 Acute on chronic systolic (congestive) heart failure: Secondary | ICD-10-CM | POA: Diagnosis present

## 2024-05-18 DIAGNOSIS — I4891 Unspecified atrial fibrillation: Secondary | ICD-10-CM | POA: Diagnosis present

## 2024-05-18 DIAGNOSIS — E872 Acidosis, unspecified: Secondary | ICD-10-CM | POA: Diagnosis present

## 2024-05-18 DIAGNOSIS — E875 Hyperkalemia: Secondary | ICD-10-CM | POA: Diagnosis present

## 2024-05-18 DIAGNOSIS — R0600 Dyspnea, unspecified: Principal | ICD-10-CM

## 2024-05-18 DIAGNOSIS — Z91158 Patient's noncompliance with renal dialysis for other reason: Secondary | ICD-10-CM

## 2024-05-18 DIAGNOSIS — Z888 Allergy status to other drugs, medicaments and biological substances status: Secondary | ICD-10-CM

## 2024-05-18 DIAGNOSIS — I9589 Other hypotension: Secondary | ICD-10-CM | POA: Diagnosis present

## 2024-05-18 DIAGNOSIS — I731 Thromboangiitis obliterans [Buerger's disease]: Secondary | ICD-10-CM | POA: Diagnosis present

## 2024-05-18 DIAGNOSIS — Z1152 Encounter for screening for COVID-19: Secondary | ICD-10-CM

## 2024-05-18 DIAGNOSIS — I351 Nonrheumatic aortic (valve) insufficiency: Secondary | ICD-10-CM | POA: Diagnosis present

## 2024-05-18 DIAGNOSIS — I132 Hypertensive heart and chronic kidney disease with heart failure and with stage 5 chronic kidney disease, or end stage renal disease: Principal | ICD-10-CM | POA: Diagnosis present

## 2024-05-18 DIAGNOSIS — I7300 Raynaud's syndrome without gangrene: Secondary | ICD-10-CM | POA: Diagnosis present

## 2024-05-18 DIAGNOSIS — E440 Moderate protein-calorie malnutrition: Secondary | ICD-10-CM | POA: Diagnosis present

## 2024-05-18 DIAGNOSIS — Z681 Body mass index (BMI) 19 or less, adult: Secondary | ICD-10-CM

## 2024-05-18 DIAGNOSIS — M329 Systemic lupus erythematosus, unspecified: Secondary | ICD-10-CM | POA: Diagnosis present

## 2024-05-18 DIAGNOSIS — F17210 Nicotine dependence, cigarettes, uncomplicated: Secondary | ICD-10-CM | POA: Diagnosis present

## 2024-05-18 DIAGNOSIS — Z992 Dependence on renal dialysis: Secondary | ICD-10-CM

## 2024-05-18 DIAGNOSIS — Z881 Allergy status to other antibiotic agents status: Secondary | ICD-10-CM

## 2024-05-18 DIAGNOSIS — J9601 Acute respiratory failure with hypoxia: Secondary | ICD-10-CM | POA: Diagnosis present

## 2024-05-18 DIAGNOSIS — Z79899 Other long term (current) drug therapy: Secondary | ICD-10-CM

## 2024-05-18 LAB — I-STAT VENOUS BLOOD GAS, ED
Acid-base deficit: 7 mmol/L — ABNORMAL HIGH (ref 0.0–2.0)
Bicarbonate: 18.9 mmol/L — ABNORMAL LOW (ref 20.0–28.0)
Calcium, Ion: 0.79 mmol/L — CL (ref 1.15–1.40)
HCT: 45 % (ref 36.0–46.0)
Hemoglobin: 15.3 g/dL — ABNORMAL HIGH (ref 12.0–15.0)
O2 Saturation: 66 %
Potassium: 6.3 mmol/L (ref 3.5–5.1)
Sodium: 134 mmol/L — ABNORMAL LOW (ref 135–145)
TCO2: 20 mmol/L — ABNORMAL LOW (ref 22–32)
pCO2, Ven: 37.7 mmHg — ABNORMAL LOW (ref 44–60)
pH, Ven: 7.308 (ref 7.25–7.43)
pO2, Ven: 37 mmHg (ref 32–45)

## 2024-05-18 LAB — CBG MONITORING, ED
Glucose-Capillary: 109 mg/dL — ABNORMAL HIGH (ref 70–99)
Glucose-Capillary: <10 mg/dL — CL (ref 70–99)

## 2024-05-18 LAB — I-STAT CHEM 8, ED
BUN: 82 mg/dL — ABNORMAL HIGH (ref 6–20)
Calcium, Ion: 0.77 mmol/L — CL (ref 1.15–1.40)
Chloride: 100 mmol/L (ref 98–111)
Creatinine, Ser: 7.7 mg/dL — ABNORMAL HIGH (ref 0.44–1.00)
Glucose, Bld: 26 mg/dL — CL (ref 70–99)
HCT: 46 % (ref 36.0–46.0)
Hemoglobin: 15.6 g/dL — ABNORMAL HIGH (ref 12.0–15.0)
Potassium: 6.4 mmol/L (ref 3.5–5.1)
Sodium: 134 mmol/L — ABNORMAL LOW (ref 135–145)
TCO2: 20 mmol/L — ABNORMAL LOW (ref 22–32)

## 2024-05-18 LAB — CBC
HCT: 43.3 % (ref 36.0–46.0)
Hemoglobin: 13.5 g/dL (ref 12.0–15.0)
MCH: 28.5 pg (ref 26.0–34.0)
MCHC: 31.2 g/dL (ref 30.0–36.0)
MCV: 91.5 fL (ref 80.0–100.0)
Platelets: 234 10*3/uL (ref 150–400)
RBC: 4.73 MIL/uL (ref 3.87–5.11)
RDW: 20.2 % — ABNORMAL HIGH (ref 11.5–15.5)
WBC: 5.7 10*3/uL (ref 4.0–10.5)
nRBC: 5.6 % — ABNORMAL HIGH (ref 0.0–0.2)

## 2024-05-18 LAB — BRAIN NATRIURETIC PEPTIDE: B Natriuretic Peptide: >4500 pg/mL — ABNORMAL HIGH (ref 0.0–100.0)

## 2024-05-18 MED ORDER — SODIUM ZIRCONIUM CYCLOSILICATE 10 G PO PACK
10.0000 g | PACK | Freq: Two times a day (BID) | ORAL | Status: DC
  Administered 2024-05-18: 10 g via ORAL
  Filled 2024-05-18: qty 1

## 2024-05-18 MED ORDER — DEXTROSE 10 % IV SOLN: INTRAVENOUS | Status: DC

## 2024-05-18 MED ORDER — ALBUTEROL SULFATE (2.5 MG/3ML) 0.083% IN NEBU
10.0000 mg | INHALATION_SOLUTION | Freq: Once | RESPIRATORY_TRACT | Status: AC
  Administered 2024-05-18: 10 mg via RESPIRATORY_TRACT
  Filled 2024-05-18: qty 12

## 2024-05-18 MED ORDER — DEXTROSE 50 % IV SOLN
50.0000 mL | Freq: Once | INTRAVENOUS | Status: AC
  Administered 2024-05-18: 50 mL via INTRAVENOUS

## 2024-05-18 MED ORDER — CHLORHEXIDINE GLUCONATE CLOTH 2 % EX PADS
6.0000 | MEDICATED_PAD | Freq: Every day | CUTANEOUS | Status: DC
  Administered 2024-05-22: 6 via TOPICAL

## 2024-05-18 MED ORDER — DEXTROSE 50 % IV SOLN
INTRAVENOUS | Status: AC
Start: 2024-05-18 — End: 2024-05-19
  Filled 2024-05-18: qty 50

## 2024-05-18 NOTE — ED Provider Notes (Signed)
 [place] EMERGENCY DEPARTMENT AT [facility] Provider Note   CSN: [id number] Arrival date & time: 05/18/24  2136     History  Chief Complaint  Patient presents with   Shortness of Breath    [name] is a 43 y.o. female.  43 yo F with a chief complaints of pain all over her body.  She thought that otherwise she had been doing reasonably well.  She has been having diarrhea pretty consistently for the last 24 to 48 hours.  She missed dialysis today due to that.  To be hypoxic with EMS.  Not normally on oxygen.  Brought here for evaluation.   Shortness of Breath      Home Medications Prior to Admission medications   Medication Sig Start Date End Date Taking? Authorizing Provider  acetaminophen  (TYLENOL ) 500 MG tablet Take 2 tablets (1,000 mg total) by mouth 4 (four) times daily. 05/12/24   Swayze, Ava, DO  amiodarone  (PACERONE ) 200 MG tablet Take 1 tablet (200 mg total) by mouth daily. 04/24/24   Vada Garibaldi, MD  cinacalcet  (SENSIPAR ) 30 MG tablet Take 1 tablet (30 mg total) by mouth every evening. 04/21/24   Vada Garibaldi, MD  Doxercalciferol  (HECTOROL  IV) Inject 1 mcg into the vein See admin instructions. On dialysis days (Tuesday, Thursday, Saturday) 04/27/24 04/26/25  [provider]  LOPERAMIDE  HCL PO Take 4 mg by mouth See admin instructions. As needed on dialysis days 04/27/24 04/26/25  [provider]  Methoxy PEG-Epoetin Beta (MIRCERA IJ) Inject 30 mcg into the vein every 28 (twenty-eight) days. 05/02/24 05/01/25  [provider]  midodrine  (PROAMATINE ) 10 MG tablet Take 1 tablet (10 mg total) by mouth 3 (three) times daily with meals. 05/12/24   Swayze, Ava, DO  oxyCODONE  (OXY IR/ROXICODONE ) 5 MG immediate release tablet Take 1 tablet (5 mg total) by mouth 2 (two) times daily as needed for severe pain (pain score 7-10). Patient not taking: Reported on 05/17/2024 04/21/24   Ghimire, Kuber, MD  sevelamer  carbonate (RENVELA ) 800 MG tablet Take  4 tablets (3,200 mg total) by mouth 3 (three) times daily with meals. 05/12/24   Swayze, Ava, DO  sevelamer  carbonate (RENVELA ) 800 MG tablet Take 2 tablets (1,600 mg total) by mouth with snacks. 05/12/24   Swayze, Ava, DO      Allergies    Cephalosporins, Tobramycin sulfate, and Vancomycin     Review of Systems   Review of Systems  Respiratory:  Positive for shortness of breath.     Physical Exam Updated Vital Signs BP (!) 105/92   Pulse (!) 106   Temp 97.7 F (36.5 C) (Oral)   Resp (!) 27   Ht 5\' 3"  (1.6 m)   Wt 41 kg   LMP  (LMP Unknown) Comment: states over 2 years ago  SpO2 99%   BMI 16.01 kg/m  Physical Exam Vitals and nursing note reviewed.  Constitutional:      General: She is not in acute distress.    Appearance: She is well-developed. She is not diaphoretic.     Comments: Appears much older than stated age  HENT:     Head: Normocephalic and atraumatic.  Eyes:     Pupils: Pupils are equal, round, and reactive to light.  Neck:     Vascular: JVD present.     Comments: JVD to the angle of the jaw Cardiovascular:     Rate and Rhythm: Normal rate and regular rhythm.     Heart sounds: No  murmur heard.    No friction rub. No gallop.  Pulmonary:     Effort: Pulmonary effort is normal.     Breath sounds: No wheezing or rales.  Abdominal:     General: There is no distension.     Palpations: Abdomen is soft.     Tenderness: There is no abdominal tenderness.  Musculoskeletal:        General: No tenderness.     Cervical back: Normal range of motion and neck supple.  Skin:    General: Skin is warm and dry.  Neurological:     Mental Status: She is alert and oriented to person, place, and time.  Psychiatric:        Behavior: Behavior normal.     ED Results / Procedures / Treatments   Labs (all labs ordered are listed, but only abnormal results are displayed) Labs Reviewed  CBC - Abnormal; Notable for the following components:      Result Value   RDW 20.2 (*)     nRBC 5.6 (*)    All other components within normal limits  BRAIN NATRIURETIC PEPTIDE - Abnormal; Notable for the following components:   B Natriuretic Peptide >4,500.0 (*)    All other components within normal limits  I-STAT CHEM 8, ED - Abnormal; Notable for the following components:   Sodium 134 (*)    Potassium 6.4 (*)    BUN 82 (*)    Creatinine, Ser 7.70 (*)    Glucose, Bld 26 (*)    Calcium , Ion 0.77 (*)    TCO2 20 (*)    Hemoglobin 15.6 (*)    All other components within normal limits  I-STAT VENOUS BLOOD GAS, ED - Abnormal; Notable for the following components:   pCO2, Ven 37.7 (*)    Bicarbonate 18.9 (*)    TCO2 20 (*)    Acid-base deficit 7.0 (*)    Sodium 134 (*)    Potassium 6.3 (*)    Calcium , Ion 0.79 (*)    Hemoglobin 15.3 (*)    All other components within normal limits  CBG MONITORING, ED - Abnormal; Notable for the following components:   Glucose-Capillary <10 (*)    All other components within normal limits  CBG MONITORING, ED - Abnormal; Notable for the following components:   Glucose-Capillary 109 (*)    All other components within normal limits  HEPATITIS B SURFACE ANTIGEN  HEPATITIS B SURFACE ANTIBODY, QUANTITATIVE  BASIC METABOLIC PANEL WITH GFR  CBG MONITORING, ED  TROPONIN I (HIGH SENSITIVITY)  TROPONIN I (HIGH SENSITIVITY)    EKG EKG Interpretation Date/Time:  Thursday May 18 2024 22:31:03 EDT Ventricular Rate:  128 PR Interval:    QRS Duration:  142 QT Interval:  403 QTC Calculation: 589 R Axis:   204  Text Interpretation: Atrial fibrillation Right bundle branch block Abnormal lateral Q waves Since last tracing rate faster Otherwise no significant change Confirmed by Albertus Hughs [phone]) on 05/18/2024 10:37:29 PM  Radiology DG Chest 1 View Result Date: 05/18/2024 CLINICAL DATA:  Shortness of breath EXAM: CHEST  1 VIEW COMPARISON:  05/08/2024 FINDINGS: Cardiomegaly with central congestion and mild diffuse interstitial opacity suggestive  of edema, slightly improved compared with 05/08/2024. Aortic atherosclerosis. No pneumothorax IMPRESSION: Cardiomegaly with central congestion and mild diffuse interstitial opacity suggestive of edema, slightly improved compared with 05/08/2024. Electronically Signed   By: Esmeralda Hedge M.D.   On: 05/18/2024 22:58    Procedures .Critical Care  Performed by: Albertus Hughs, DO  Authorized by: Albertus Hughs, DO   Critical care provider statement:    Critical care time (minutes):  80   Critical care time was exclusive of:  Separately billable procedures and treating other patients   Critical care was time spent personally by me on the following activities:  Development of treatment plan with patient or surrogate, discussions with consultants, evaluation of patient's response to treatment, examination of patient, ordering and review of laboratory studies, ordering and review of radiographic studies, ordering and performing treatments and interventions, pulse oximetry, re-evaluation of patient's condition and review of old charts   Care discussed with: admitting provider       Medications Ordered in ED Medications  sodium zirconium cyclosilicate  (LOKELMA ) packet 10 g (10 g Oral Given 05/18/24 2303)  dextrose  10 % infusion ( Intravenous New Bag/Given 05/18/24 2305)  Chlorhexidine  Gluconate Cloth 2 % PADS 6 each (has no administration in time range)  dextrose  50 % solution 50 mL (50 mLs Intravenous Given 05/18/24 2234)  albuterol  (PROVENTIL ) (2.5 MG/3ML) 0.083% nebulizer solution 10 mg (10 mg Nebulization Given 05/18/24 2303)    ED Course/ Medical Decision Making/ A&P                                 Medical Decision Making Amount and/or Complexity of Data Reviewed Labs: ordered.  Risk Prescription drug management. Decision regarding hospitalization.   43 yo F with a chief complaints of pain to her fingers.  She tells me that she gets this pain usually with a lupus flare.  She was found to also be  hypoxic and hypoglycemic.  Has not really been eating and drinking much the past couple days and has been having profuse diarrhea.  Potassium 6.3 on initial i-STAT.  She does have peaked T waves.  Will temporize here.  Patient started on a D10 infusion.  Blood sugar came up with additional D50.  Discussed case with Dr. Britta Candy.  Will try to arrange for urgent dialysis but thinks that will most likely be in the morning.  The patients results and plan were reviewed and discussed.   Any x-rays performed were independently reviewed by myself.   Differential diagnosis were considered with the presenting HPI.  Medications  sodium zirconium cyclosilicate  (LOKELMA ) packet 10 g (10 g Oral Given 05/18/24 2303)  dextrose  10 % infusion ( Intravenous New Bag/Given 05/18/24 2305)  Chlorhexidine  Gluconate Cloth 2 % PADS 6 each (has no administration in time range)  dextrose  50 % solution 50 mL (50 mLs Intravenous Given 05/18/24 2234)  albuterol  (PROVENTIL ) (2.5 MG/3ML) 0.083% nebulizer solution 10 mg (10 mg Nebulization Given 05/18/24 2303)    Vitals:   05/18/24 2203 05/18/24 2204 05/18/24 2207  BP:  (!) 105/92   Pulse:  (!) 106   Resp:  (!) 27   Temp:  97.7 F (36.5 C)   TempSrc:  Oral   SpO2: (!) 85% 99%   Weight:   41 kg  Height:   5\' 3"  (1.6 m)    Final diagnoses:  Dyspnea, unspecified type  Hypoglycemia    Admission/ observation were discussed with the admitting physician, patient and/or family and they are comfortable with the plan.          Final Clinical Impression(s) / ED Diagnoses Final diagnoses:  Dyspnea, unspecified type  Hypoglycemia    Rx / DC Orders ED Discharge Orders     None  Albertus Hughs, DO 05/18/24 2357

## 2024-05-18 NOTE — ED Notes (Signed)
Pt given graham crackers and apple juice  

## 2024-05-18 NOTE — ED Triage Notes (Signed)
 Patient BIB [facility] from home with complaint of SOB due to missed dialysis. Patient reports missing dialysis due to diarrhea today & lupus flair up with pain all over.   EMS placed patient on 8L [place] due to patient demands.

## 2024-05-19 ENCOUNTER — Inpatient Hospital Stay (HOSPITAL_COMMUNITY)

## 2024-05-19 ENCOUNTER — Ambulatory Visit: Admitting: Physical Therapy

## 2024-05-19 DIAGNOSIS — Z881 Allergy status to other antibiotic agents status: Secondary | ICD-10-CM | POA: Diagnosis not present

## 2024-05-19 DIAGNOSIS — E440 Moderate protein-calorie malnutrition: Secondary | ICD-10-CM | POA: Diagnosis present

## 2024-05-19 DIAGNOSIS — R0600 Dyspnea, unspecified: Secondary | ICD-10-CM

## 2024-05-19 DIAGNOSIS — M329 Systemic lupus erythematosus, unspecified: Secondary | ICD-10-CM | POA: Diagnosis present

## 2024-05-19 DIAGNOSIS — E8779 Other fluid overload: Secondary | ICD-10-CM

## 2024-05-19 DIAGNOSIS — E875 Hyperkalemia: Secondary | ICD-10-CM | POA: Diagnosis present

## 2024-05-19 DIAGNOSIS — I731 Thromboangiitis obliterans [Buerger's disease]: Secondary | ICD-10-CM | POA: Diagnosis present

## 2024-05-19 DIAGNOSIS — I5023 Acute on chronic systolic (congestive) heart failure: Secondary | ICD-10-CM

## 2024-05-19 DIAGNOSIS — E162 Hypoglycemia, unspecified: Secondary | ICD-10-CM | POA: Diagnosis present

## 2024-05-19 DIAGNOSIS — J9601 Acute respiratory failure with hypoxia: Secondary | ICD-10-CM | POA: Diagnosis present

## 2024-05-19 DIAGNOSIS — Z1152 Encounter for screening for COVID-19: Secondary | ICD-10-CM | POA: Diagnosis not present

## 2024-05-19 DIAGNOSIS — Z992 Dependence on renal dialysis: Secondary | ICD-10-CM | POA: Diagnosis not present

## 2024-05-19 DIAGNOSIS — N2581 Secondary hyperparathyroidism of renal origin: Secondary | ICD-10-CM | POA: Diagnosis present

## 2024-05-19 DIAGNOSIS — I050 Rheumatic mitral stenosis: Secondary | ICD-10-CM | POA: Diagnosis present

## 2024-05-19 DIAGNOSIS — N186 End stage renal disease: Secondary | ICD-10-CM

## 2024-05-19 DIAGNOSIS — I4891 Unspecified atrial fibrillation: Secondary | ICD-10-CM | POA: Diagnosis present

## 2024-05-19 DIAGNOSIS — I132 Hypertensive heart and chronic kidney disease with heart failure and with stage 5 chronic kidney disease, or end stage renal disease: Secondary | ICD-10-CM | POA: Diagnosis present

## 2024-05-19 DIAGNOSIS — E8770 Fluid overload, unspecified: Secondary | ICD-10-CM | POA: Diagnosis present

## 2024-05-19 DIAGNOSIS — Z681 Body mass index (BMI) 19 or less, adult: Secondary | ICD-10-CM | POA: Diagnosis not present

## 2024-05-19 DIAGNOSIS — I5A Non-ischemic myocardial injury (non-traumatic): Secondary | ICD-10-CM | POA: Diagnosis present

## 2024-05-19 DIAGNOSIS — F17210 Nicotine dependence, cigarettes, uncomplicated: Secondary | ICD-10-CM | POA: Diagnosis present

## 2024-05-19 DIAGNOSIS — Z79899 Other long term (current) drug therapy: Secondary | ICD-10-CM | POA: Diagnosis not present

## 2024-05-19 DIAGNOSIS — D631 Anemia in chronic kidney disease: Secondary | ICD-10-CM | POA: Diagnosis present

## 2024-05-19 DIAGNOSIS — R64 Cachexia: Secondary | ICD-10-CM | POA: Diagnosis present

## 2024-05-19 DIAGNOSIS — E872 Acidosis, unspecified: Secondary | ICD-10-CM | POA: Diagnosis present

## 2024-05-19 LAB — BASIC METABOLIC PANEL WITH GFR
Anion gap: 28 — ABNORMAL HIGH (ref 5–15)
Anion gap: 23 — ABNORMAL HIGH (ref 5–15)
BUN: 87 mg/dL — ABNORMAL HIGH (ref 6–20)
BUN: 87 mg/dL — ABNORMAL HIGH (ref 6–20)
CO2: 15 mmol/L — ABNORMAL LOW (ref 22–32)
CO2: 19 mmol/L — ABNORMAL LOW (ref 22–32)
Calcium: 7.3 mg/dL — ABNORMAL LOW (ref 8.9–10.3)
Calcium: 7 mg/dL — ABNORMAL LOW (ref 8.9–10.3)
Chloride: 91 mmol/L — ABNORMAL LOW (ref 98–111)
Chloride: 89 mmol/L — ABNORMAL LOW (ref 98–111)
Creatinine, Ser: 7.45 mg/dL — ABNORMAL HIGH (ref 0.44–1.00)
Creatinine, Ser: 7.29 mg/dL — ABNORMAL HIGH (ref 0.44–1.00)
GFR, Estimated: 7 mL/min — ABNORMAL LOW (ref >60)
GFR, Estimated: 7 mL/min — ABNORMAL LOW (ref >60)
Glucose, Bld: 116 mg/dL — ABNORMAL HIGH (ref 70–99)
Glucose, Bld: 326 mg/dL — ABNORMAL HIGH (ref 70–99)
Potassium: 5.9 mmol/L — ABNORMAL HIGH (ref 3.5–5.1)
Potassium: 5.1 mmol/L (ref 3.5–5.1)
Sodium: 134 mmol/L — ABNORMAL LOW (ref 135–145)
Sodium: 131 mmol/L — ABNORMAL LOW (ref 135–145)

## 2024-05-19 LAB — CBG MONITORING, ED
Glucose-Capillary: 149 mg/dL — ABNORMAL HIGH (ref 70–99)
Glucose-Capillary: 97 mg/dL (ref 70–99)

## 2024-05-19 LAB — CBC
HCT: 33.7 % — ABNORMAL LOW (ref 36.0–46.0)
Hemoglobin: 10.8 g/dL — ABNORMAL LOW (ref 12.0–15.0)
MCH: 28.6 pg (ref 26.0–34.0)
MCHC: 32 g/dL (ref 30.0–36.0)
MCV: 89.4 fL (ref 80.0–100.0)
Platelets: 204 10*3/uL (ref 150–400)
RBC: 3.77 MIL/uL — ABNORMAL LOW (ref 3.87–5.11)
RDW: 19.5 % — ABNORMAL HIGH (ref 11.5–15.5)
WBC: 5.2 10*3/uL (ref 4.0–10.5)
nRBC: 4.2 % — ABNORMAL HIGH (ref 0.0–0.2)

## 2024-05-19 LAB — LACTIC ACID, PLASMA
Lactic Acid, Venous: 3.8 mmol/L (ref 0.5–1.9)
Lactic Acid, Venous: 3.6 mmol/L (ref 0.5–1.9)

## 2024-05-19 LAB — ECHOCARDIOGRAM COMPLETE
Height: 63 in
MV VTI: 1.6 cm2
S' Lateral: 4.9 cm
Weight: 1446.22 [oz_av]

## 2024-05-19 LAB — RESP PANEL BY RT-PCR (RSV, FLU A&B, COVID)  RVPGX2
Influenza A by PCR: NEGATIVE
Influenza B by PCR: NEGATIVE
Resp Syncytial Virus by PCR: NEGATIVE
SARS Coronavirus 2 by RT PCR: NEGATIVE

## 2024-05-19 LAB — SEDIMENTATION RATE: Sed Rate: 22 mm/h (ref 0–22)

## 2024-05-19 LAB — TROPONIN I (HIGH SENSITIVITY)
Troponin I (High Sensitivity): 154 ng/L (ref <18)
Troponin I (High Sensitivity): 153 ng/L (ref <18)

## 2024-05-19 LAB — MAGNESIUM: Magnesium: 2.4 mg/dL (ref 1.7–2.4)

## 2024-05-19 LAB — ACTH STIMULATION, 3 TIME POINTS
Cortisol, 30 Min: 14.3 ug/dL
Cortisol, 60 Min: 18.7 ug/dL
Cortisol, Base: 15.2 ug/dL

## 2024-05-19 LAB — HEPATITIS B SURFACE ANTIGEN: Hepatitis B Surface Ag: NONREACTIVE

## 2024-05-19 LAB — GLUCOSE, CAPILLARY
Glucose-Capillary: 71 mg/dL (ref 70–99)
Glucose-Capillary: 91 mg/dL (ref 70–99)

## 2024-05-19 LAB — PHOSPHORUS: Phosphorus: 7.3 mg/dL — ABNORMAL HIGH (ref 2.5–4.6)

## 2024-05-19 MED ORDER — CHLORHEXIDINE GLUCONATE CLOTH 2 % EX PADS
6.0000 | MEDICATED_PAD | Freq: Every day | CUTANEOUS | Status: DC
  Administered 2024-05-20 – 2024-05-22 (×2): 6 via TOPICAL

## 2024-05-19 MED ORDER — SEVELAMER CARBONATE 800 MG PO TABS
1600.0000 mg | ORAL_TABLET | ORAL | Status: DC
  Administered 2024-05-20 – 2024-05-21 (×3): 1600 mg via ORAL
  Filled 2024-05-19 (×3): qty 2

## 2024-05-19 MED ORDER — AMIODARONE HCL 200 MG PO TABS
200.0000 mg | ORAL_TABLET | Freq: Every day | ORAL | Status: DC
  Administered 2024-05-19 – 2024-05-22 (×4): 200 mg via ORAL
  Filled 2024-05-19 (×4): qty 1

## 2024-05-19 MED ORDER — DOXERCALCIFEROL 4 MCG/2ML IV SOLN
2.0000 ug | INTRAVENOUS | Status: DC
  Filled 2024-05-19: qty 2

## 2024-05-19 MED ORDER — SEVELAMER CARBONATE 800 MG PO TABS: 1600.0000 mg | ORAL_TABLET | ORAL | Status: DC

## 2024-05-19 MED ORDER — SEVELAMER CARBONATE 800 MG PO TABS
3200.0000 mg | ORAL_TABLET | Freq: Three times a day (TID) | ORAL | Status: DC
Start: 2024-05-19 — End: 2024-05-19

## 2024-05-19 MED ORDER — SODIUM CHLORIDE 0.9% FLUSH
3.0000 mL | Freq: Two times a day (BID) | INTRAVENOUS | Status: DC
  Administered 2024-05-19 – 2024-05-22 (×7): 3 mL via INTRAVENOUS

## 2024-05-19 MED ORDER — ACETAMINOPHEN 500 MG PO TABS
1000.0000 mg | ORAL_TABLET | Freq: Four times a day (QID) | ORAL | Status: DC | PRN
  Administered 2024-05-20 (×2): 1000 mg via ORAL
  Filled 2024-05-19 (×3): qty 2

## 2024-05-19 MED ORDER — MIDODRINE HCL 5 MG PO TABS
10.0000 mg | ORAL_TABLET | Freq: Three times a day (TID) | ORAL | Status: DC
  Administered 2024-05-19 – 2024-05-22 (×10): 10 mg via ORAL
  Filled 2024-05-19 (×11): qty 2

## 2024-05-19 MED ORDER — HYDROXYZINE HCL 10 MG PO TABS
10.0000 mg | ORAL_TABLET | Freq: Four times a day (QID) | ORAL | Status: DC | PRN
  Administered 2024-05-19 – 2024-05-22 (×8): 10 mg via ORAL
  Filled 2024-05-19 (×9): qty 1

## 2024-05-19 MED ORDER — COSYNTROPIN 0.25 MG IJ SOLR
0.2500 mg | Freq: Once | INTRAMUSCULAR | Status: AC
  Administered 2024-05-19: 0.25 mg via INTRAVENOUS
  Filled 2024-05-19: qty 0.25

## 2024-05-19 MED ORDER — DEXTROSE 10 % IV SOLN: INTRAVENOUS | Status: AC

## 2024-05-19 MED ORDER — SEVELAMER CARBONATE 800 MG PO TABS
3200.0000 mg | ORAL_TABLET | Freq: Three times a day (TID) | ORAL | Status: DC
  Administered 2024-05-19 – 2024-05-22 (×10): 3200 mg via ORAL
  Filled 2024-05-19 (×9): qty 4

## 2024-05-19 MED ORDER — SODIUM ZIRCONIUM CYCLOSILICATE 10 G PO PACK
10.0000 g | PACK | Freq: Three times a day (TID) | ORAL | Status: AC
  Administered 2024-05-19: 10 g via ORAL
  Filled 2024-05-19 (×5): qty 1

## 2024-05-19 MED ORDER — INSULIN ASPART 100 UNIT/ML IJ SOLN: 0.0000 [IU] | Freq: Three times a day (TID) | INTRAMUSCULAR | Status: DC

## 2024-05-19 MED ORDER — CINACALCET HCL 30 MG PO TABS
30.0000 mg | ORAL_TABLET | Freq: Every evening | ORAL | Status: DC
  Administered 2024-05-19 – 2024-05-20 (×2): 30 mg via ORAL
  Filled 2024-05-19 (×2): qty 1

## 2024-05-19 MED ORDER — METHOCARBAMOL 500 MG PO TABS
500.0000 mg | ORAL_TABLET | Freq: Three times a day (TID) | ORAL | Status: DC | PRN
  Administered 2024-05-19 – 2024-05-22 (×9): 500 mg via ORAL
  Filled 2024-05-19 (×9): qty 1

## 2024-05-19 MED ORDER — HEPARIN SODIUM (PORCINE) 5000 UNIT/ML IJ SOLN
5000.0000 [IU] | Freq: Three times a day (TID) | INTRAMUSCULAR | Status: DC
  Administered 2024-05-19 – 2024-05-21 (×6): 5000 [IU] via SUBCUTANEOUS
  Filled 2024-05-19 (×8): qty 1

## 2024-05-19 MED ORDER — MELATONIN 3 MG PO TABS: 6.0000 mg | ORAL_TABLET | Freq: Every evening | ORAL | Status: DC | PRN

## 2024-05-19 MED ORDER — ALBUMIN HUMAN 25 % IV SOLN
25.0000 g | Freq: Once | INTRAVENOUS | Status: AC
Start: 2024-05-19 — End: 2024-05-19
  Administered 2024-05-19: 25 g via INTRAVENOUS

## 2024-05-19 MED ORDER — CAMPHOR-MENTHOL 0.5-0.5 % EX LOTN
TOPICAL_LOTION | CUTANEOUS | Status: DC | PRN
  Filled 2024-05-19: qty 222

## 2024-05-19 MED ORDER — ALBUTEROL SULFATE (2.5 MG/3ML) 0.083% IN NEBU
2.5000 mg | INHALATION_SOLUTION | Freq: Four times a day (QID) | RESPIRATORY_TRACT | Status: DC | PRN
Start: 2024-05-19 — End: 2024-05-22
  Filled 2024-05-19: qty 3

## 2024-05-19 NOTE — Progress Notes (Signed)
 PROGRESS NOTE    [name]  YQM:[id number] DOB: 17-Jul-1981 DOA: 05/18/2024 PCP: Senaida Dama, NP  Outpatient Specialists:     Brief Narrative:  43 y.o. female with hx ESRD on TTS HD, SLE with lupus nephritis, heart failure with reduced ejection fraction, a flutter not on anticoagulation due to prior subdural hematoma, aortic regurgitation, mitral stenosis, prior history of polysubstance use.  Admitted earlier today with Chronic SOB, pulmonary edema, worsening pain in hands and feet (possible lupus flare), hyperkalemia, hypocalcemia, hyperphosphatemia, acidosis and severe hyperglycemia.  Patient also missed hemodialysis.  05/19/2024: Patient seen.  ECHO is in progress.  No significant history from the patient. ? Parotid swelling.  Low threshold to consult the Palliative care team.  Guarded prognosis.  Assessment & Plan:   Active Problems:   ESRD on dialysis (HCC)   Acute on chronic systolic CHF (congestive heart failure) (HCC)   Atrial fibrillation with RVR (HCC)   Acute hypoxemic respiratory failure (HCC)   Volume overload   Hypoglycemia   History ESRD with missed HD session, on TTS HD Acute hypoxic respiratory failure secondary to pulmonary edema, volume overload Hyperkalemia, improving  AGMA  Elevated BUN without clinical uremia  Missed HD session due to symptoms of pain, diarrhea.  On initial evaluation by EMS hypoxic requiring 6 L of O2. Chest x-ray with pulmonary edema. K 6.4 -> 5.9, + EKG changes with peaked T waves. Bicarb 15, AG 28. BUN 82.  -EDP consulted nephrology Dr. Britta Candy who will try to arrange urgent dialysis but thinks most likely will be in the morning.   -Anuric, volume management by HD  -Continue Lokelma  10 g p.o. 3 times daily tentatively ordered for 2 days, continue as needed -Check lactate with AGMA  -Continue home midodrine  10 mg 3 times daily for blood pressure support -Continue home Sevelamer , Cinacalcet  -Renal diet 05/19/2024: Awaiting HD    Acute exacerbation heart failure with reduced ejection fraction, BiV failure  Moderate to severe aortic regurgitation Mild mitral stenosis Last TTE 12/' 24 LVEF 30 to 35%, global hypokinesis, moderately reduced RV systolic function, mild MS, moderate to severe AI.  BNP greater than 4500.  High-sensitivity troponin elevated per below.  Etiology of heart failure exacerbation suspect mainly secondary to volume overload from missed dialysis, may have component of chronic myocardial injury and tachyarrhythmia contributing as well. -Volume management per HD -Repeat TTE -Intolerant of GDMT due to hypotension 05/19/2024:  Low threshold to consult the cardiology team.   Atrial fibrillation with rapid ventricular response, Initial EKG A-fib rate 128; rates in the ED mainly ~low 100s.  Suspect trigger is volume overload - Continue home amiodarone  200 mg daily.  Rate control acceptable for now, follow response with HD, if worsening rates would start on Amio bolus and drip -Not on anticoagulation due to prior history of subdural hematoma   Severe range hypoglycemia Question possible pseudohypoglycemia considering she was asymptomatic despite markedly severe hypoglycemia with nadir less than 10.  Not on diabetic medications.  Etiology if this was true hypoglycemia is unclear. - Continue D10 drip reduced rate to 25 cc an hour considering her ESRD and volume overload. - Check cosyntropin  stim test in the morning. - If she is to have another episode of hypoglycemia per fingerstick would draw a stat serum glucose for comparison and then treat her hypoglycemia protocol 05/19/2024: Continue to monitor closely.   Chronic myocardial injury:  Chronic elevation in HS troponin currently flat at 153 which is similar to prior. Demand in the  setting of her volume overload, hypoxia, arrhythmia.  -Management directed at these conditions.   Arthralgias in hands and feet  Question ischemic pain v lupus flare Acute onset  of pain without evidence of significant synovitis on exam.  Does have distal digital ulceration especially in the right hand.  Considering her respiratory failure and hypoxia, chronic hypotension question if may have been having ischemic pain in her distal limbs rather than true lupus flare. -Check ESR, DS DNA ab  -If she has evidence of ongoing inflammation and pain does not improve with management of her volume status, correction of her hypoxia then will consider initiation of prednisone  and follow-up with rheumatology as outpatient -Symptomatic management Tylenol  as needed mild, methocarbamol  as needed spasms, can use oxycodone  as needed (currently held off on a standing prn order)  05/19/2024: Follow ESR and double-stranded DNA antibody.  Low threshold to start patient on prednisone .   Chronic medical problems: History of polysubstance use: Check serum tox   Body mass index is 16.01 kg/m.  Moderate protein calorie malnutrition   DVT prophylaxis: [place] Heparin  Code Status: Full code.  Family Communication:  Disposition Plan: Inpatient   Consultants:  Nephrology  Procedures:  ECHO (Follow result)  Antimicrobials:  None   Subjective: No history  Objective: Vitals:   05/18/24 2207 05/19/24 0200 05/19/24 0615 05/19/24 0700  BP:  (!) 107/56 115/64 113/65  Pulse:  (!) 109    Resp:  (!) 25 (!) 23 20  Temp:  98 F (36.7 C)    TempSrc:  Oral    SpO2:      Weight: 41 kg     Height: 5\' 3"  (1.6 m)      No intake or output data in the 24 hours ending 05/19/24 0736 Filed Weights   05/18/24 2207  Weight: 41 kg    Examination:  General exam: Cachectic. No history  Respiratory system: Clear to auscultation.   Cardiovascular system: S1 & S2 heard, tachycardic Gastrointestinal system:  Central nervous system: No history from patient.  No communication Extremities:   Data Reviewed: I have personally reviewed following labs and imaging studies  CBC: Recent Labs  Lab  05/18/24 2210 05/18/24 2216 05/19/24 0618  WBC 5.7  --  PENDING  HGB 13.5 15.6*  15.3* 10.8*  HCT 43.3 46.0  45.0 33.7*  MCV 91.5  --  89.4  PLT 234  --  204   Basic Metabolic Panel: Recent Labs  Lab 05/18/24 2216 05/18/24 2358  NA 134*  134* 134*  K 6.4*  6.3* 5.9*  CL 100 91*  CO2  --  15*  GLUCOSE 26* 116*  BUN 82* 87*  CREATININE 7.70* 7.45*  CALCIUM   --  7.3*   GFR: Estimated Creatinine Clearance: 6.5 mL/min (A) (by C-G formula based on SCr of 7.45 mg/dL (H)). Liver Function Tests: No results for input(s): "AST", "ALT", "ALKPHOS", "BILITOT", "PROT", "ALBUMIN " in the last 168 hours. No results for input(s): "LIPASE", "AMYLASE" in the last 168 hours. No results for input(s): "AMMONIA" in the last 168 hours. Coagulation Profile: No results for input(s): "INR", "PROTIME" in the last 168 hours. Cardiac Enzymes: No results for input(s): "CKTOTAL", "CKMB", "CKMBINDEX", "TROPONINI" in the last 168 hours. BNP (last 3 results) No results for input(s): "PROBNP" in the last 8760 hours. HbA1C: No results for input(s): "HGBA1C" in the last 72 hours. CBG: Recent Labs  Lab 05/18/24 2231 05/18/24 2249 05/19/24 0019  GLUCAP <10* 109* 149*   Lipid Profile: No results for  input(s): "CHOL", "HDL", "LDLCALC", "TRIG", "CHOLHDL", "LDLDIRECT" in the last 72 hours. Thyroid Function Tests: No results for input(s): "TSH", "T4TOTAL", "FREET4", "T3FREE", "THYROIDAB" in the last 72 hours. Anemia Panel: No results for input(s): "VITAMINB12", "FOLATE", "FERRITIN", "TIBC", "IRON ", "RETICCTPCT" in the last 72 hours. Urine analysis:    Component Value Date/Time   COLORURINE YELLOW 09/25/2015 1412   APPEARANCEUR CLOUDY (A) 09/25/2015 1412   LABSPEC 1.018 09/25/2015 1412   PHURINE 8.0 09/25/2015 1412   GLUCOSEU NEGATIVE 09/25/2015 1412   HGBUR LARGE (A) 09/25/2015 1412   BILIRUBINUR NEGATIVE 09/25/2015 1412   KETONESUR NEGATIVE 09/25/2015 1412   PROTEINUR >300 (A) 09/25/2015 1412    UROBILINOGEN 0.2 09/25/2015 1412   NITRITE NEGATIVE 09/25/2015 1412   LEUKOCYTESUR MODERATE (A) 09/25/2015 1412   Sepsis Labs: @LABRCNTIP (procalcitonin:4,lacticidven:4)  )No results found for this or any previous visit (from the past 240 hours).       Radiology Studies: DG Chest 1 View Result Date: 05/18/2024 CLINICAL DATA:  Shortness of breath EXAM: CHEST  1 VIEW COMPARISON:  05/08/2024 FINDINGS: Cardiomegaly with central congestion and mild diffuse interstitial opacity suggestive of edema, slightly improved compared with 05/08/2024. Aortic atherosclerosis. No pneumothorax IMPRESSION: Cardiomegaly with central congestion and mild diffuse interstitial opacity suggestive of edema, slightly improved compared with 05/08/2024. Electronically Signed   By: Esmeralda Hedge M.D.   On: 05/18/2024 22:58        Scheduled Meds:  amiodarone   200 mg Oral Daily   Chlorhexidine  Gluconate Cloth  6 each Topical Q0600   cinacalcet   30 mg Oral QPM   heparin   5,000 Units Subcutaneous Q8H   midodrine   10 mg Oral TID WC   sevelamer  carbonate  3,200 mg Oral TID WC   And   sevelamer  carbonate  1,600 mg Oral With snacks   sodium chloride  flush  3 mL Intravenous Q12H   sodium zirconium cyclosilicate   10 g Oral TID   Continuous Infusions:  dextrose  25 mL/hr at 05/19/24 0029     LOS: 0 days    Time spent: 55 Minutes.    Fonnie Iba, MD  [facility] Pager #: [phone] 7PM-7AM contact night coverage as above

## 2024-05-19 NOTE — Progress Notes (Signed)
   05/19/24 1856  Vitals  Pulse Rate (!) 106  Resp (!) 22  BP (!) 114/48  SpO2 100 %  O2 Device Nasal Cannula  Type of Weight Post-Dialysis  Oxygen Therapy  Patient Activity (if Appropriate) In bed  Oximetry Probe Site Changed No  Post Treatment  Dialyzer Clearance Lightly streaked  Hemodialysis Intake (mL) 0 mL  Liters Processed 72  Fluid Removed (mL) 3000 mL  Tolerated HD Treatment Yes  Post-Hemodialysis Comments goal has been met. Tx has been tolerated  AVG/AVF Arterial Site Held (minutes) 7 minutes  AVG/AVF Venous Site Held (minutes) 7 minutes   Received patient in bed to unit.  Alert and oriented.  Informed consent signed and in chart.   TX duration: 3 hours  Patient tolerated well.  Transported back to the room  Alert, without acute distress.  Hand-off given to patient's nurse.   Access used: Right upper arm fistula Access issues: None

## 2024-05-19 NOTE — Consult Note (Addendum)
 WOC Nurse Consult Note: Refer to previous consult request for photos of the leg wound which will assist with the plan of care.  Consult was performed remotely after review of the EMR.  According to the bedside nurses' wound care flow sheet, the patient has a blood blister to her leg.  Since the WOC team is not available on the weekend, I will enter  topical treatment orders for the bedside nurses to perform as follows to promote moist healing: Apply Xeroform gauze to leg wound Q day, then cover with foam dressing. Change foam dressing Q 3 days or PRN soiling. I will review the photos on Monday if available and revise the plan of care at that time if it is indicated.  Thank-you,  Wiliam Harder MSN, RN, CWOCN, [place], CNS [phone]

## 2024-05-19 NOTE — Consult Note (Signed)
 Please note that the [facility] nursing team is utilizing a standardized work plan to manage patient consults. We are triaging consults and will try to see the patients within 48 hours. Wound photos in the patient's chart allow [name] to consult on the patient in the most efficient and timely manner.    Guneet Delpino [facility], CNS, [employer] [phone]

## 2024-05-19 NOTE — Progress Notes (Signed)
  Echocardiogram 2D Echocardiogram has been performed.  Farley Honer, RDCS 05/19/2024, 9:03 AM

## 2024-05-19 NOTE — Progress Notes (Signed)
 Spoke with Isa Manuel the ED charge nurse this am Made her aware that I had called the charge RN last evening-(the 7am to 7pm RN) in regards to this pt needing to have her hepatitis panel drawn STAT---orders were already entered--as the pts antigen was expired and would need these numbers for her HD tx to take place in the am for first shift Labs were drawn and sent approx between 0530 and 0630 this am Will notify the dx this am of these patients needing to wait until 2nd shift for their tx due to this Will also begin to document every time I speak to an ED RN about such issues in the notes  Isa Manuel the charge RN will call us  today when the antigen labs have results Carola Chu RN

## 2024-05-19 NOTE — ED Notes (Signed)
 [place] paged for RN consult

## 2024-05-19 NOTE — ED Notes (Signed)
 Lab called about delayed results, According to lab machines are down and samples were sent to [facility] in [place] for results.

## 2024-05-19 NOTE — ED Notes (Signed)
 0800 offered prn breathing treatment due to accessory muscle use during breathing and again 0900 pt refused wanting stating she wanted to eat.

## 2024-05-19 NOTE — H&P (Addendum)
 History and Physical    [name]:[id number] DOB: 08/01/1981 DOA: 05/18/2024  PCP: Senaida Dama, NP   Patient coming from: Home   Chief Complaint:  Chief Complaint  Patient presents with   Shortness of Breath    HPI:  [name] is a 43 y.o. female with hx of ESRD on TTS HD, SLE with lupus nephritis, heart failure with reduced ejection fraction, a flutter not on anticoagulation due to prior subdural hematoma, aortic regurgitation, mitral stenosis, prior history of polysubstance use, who presented due to pain in her hands and feet.  She reports 1 day of worsening pain in hands and feet which she thinks is similar to her prior lupus flares.  Worst in the right hand.  Also reports recent diarrhea.  She missed dialysis earlier yesterday due to the symptoms.  Otherwise has chronic shortness of breath which she feels is mostly unchanged. No chest pain. Asymptomatic despite severe range hypoglycemia. Not taking any diabetic meds.    Review of Systems:  ROS complete and negative except as marked above   Allergies  Allergen Reactions   Cephalosporins Rash and Other (See Comments)    To both keflex  and cefazolin .  Potential bullous lesion from Ceftriaxone  11/2023.   Tobramycin Sulfate Swelling    Eye swelling   Vancomycin  Swelling    Prior to Admission medications   Medication Sig Start Date End Date Taking? Authorizing Provider  acetaminophen  (TYLENOL ) 500 MG tablet Take 2 tablets (1,000 mg total) by mouth 4 (four) times daily. 05/12/24  Yes Swayze, Ava, DO  amiodarone  (PACERONE ) 200 MG tablet Take 1 tablet (200 mg total) by mouth daily. 04/24/24  Yes Ghimire, Kuber, MD  b complex-vitamin c-folic acid  (NEPHRO-VITE) 0.8 MG TABS tablet Take 1 tablet by mouth as needed (Supplement).   Yes [provider]  cinacalcet  (SENSIPAR ) 30 MG tablet Take 1 tablet (30 mg total) by mouth every evening. Patient taking differently: Take 30 mg by mouth daily. 04/21/24  Yes Vada Garibaldi, MD  LOPERAMIDE  HCL PO Take 4 mg by mouth See admin instructions. As needed on dialysis days 04/27/24 04/26/25 Yes [provider]  Menthol , Topical Analgesic, (BIOFREEZE) 10 % CREA Apply topically.   Yes [provider]  midodrine  (PROAMATINE ) 10 MG tablet Take 1 tablet (10 mg total) by mouth 3 (three) times daily with meals. 05/12/24  Yes Swayze, Ava, DO  sevelamer  carbonate (RENVELA ) 800 MG tablet Take 4 tablets (3,200 mg total) by mouth 3 (three) times daily with meals. Patient taking differently: Take 1,600-3,200 mg by mouth 3 (three) times daily with meals. Take three tablets (3200mg ) by mouth with meals and two tablets (1600mg ) with snacks 05/12/24  Yes Swayze, Ava, DO    Past Medical History:  Diagnosis Date   Anemia    low iron  - receives iron  at dialysis   Anxiety    Arthritis    RA   Atrial fibrillation with RVR (HCC) 12/14/2023   Chronic systolic congestive heart failure (HCC) 03/16/2016   Dyspnea    ESRD (end stage renal disease) (HCC)    [facility] _ [place]   H/O pericarditis 01/17/2013   H/O pleural effusion 01/17/2013   Heart murmur    Lupus (systemic lupus erythematosus) (HCC)    Previously followed with Dr. 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 pneumonae bacteremia on 01/21/2013. Sensitive to Peniccilin     Past Surgical History:  Procedure Laterality Date   A/V FISTULAGRAM N/A 12/15/2023   Procedure: A/V Fistulagram;  Surgeon: Patrick Boor, MD;  Location:  [facility] INVASIVE CV LAB;  Service: Cardiovascular;  Laterality: N/A;   AV FISTULA PLACEMENT     AV FISTULA PLACEMENT Right 08/01/2020   Procedure: RIGHT ARM BRACHIOCEPHALIC  ARTERIOVENOUS (AV) FISTULA CREATION;  Surgeon: Mayo Speck, MD;  Location: [facility] OR;  Service: Vascular;  Laterality: Right;   BASCILIC VEIN TRANSPOSITION Left 02/05/2014   Procedure: BASCILIC VEIN TRANSPOSITION;  Surgeon: Mayo Speck, MD;  Location: [facility] OR;  Service: Vascular;  Laterality: Left;   BASCILIC VEIN TRANSPOSITION Right 01/08/2021   Procedure: RIGHT ARM SECOND STAGE BASCILIC VEIN TRANSPOSITION;  Surgeon: Mayo Speck, MD;  Location: [facility] OR;  Service: Vascular;  Laterality: Right;   BIOPSY  12/17/2021   Procedure: BIOPSY;  Surgeon: Normie Becton., MD;  Location: [facility] ENDOSCOPY;  Service: Gastroenterology;;   ESOPHAGOGASTRODUODENOSCOPY (EGD) WITH PROPOFOL  N/A 12/17/2021   Procedure: ESOPHAGOGASTRODUODENOSCOPY (EGD) WITH PROPOFOL ;  Surgeon: Normie Becton., MD;  Location: [facility] ENDOSCOPY;  Service: Gastroenterology;  Laterality: N/A;   FISTULA SUPERFICIALIZATION Left 05/30/2018   Procedure: FISTULA PLICATION BASILIC VEIN TRANSPOSITION;  Surgeon: Dannis Dy, MD;  Location: [facility] OR;  Service: Vascular;  Laterality: Left;   FISTULA SUPERFICIALIZATION Left 12/18/2019   Procedure: PLICATION OF LEFT ARTERIOVENOUS FISTULA ULCER;  Surgeon: Mayo Speck, MD;  Location: [facility] OR;  Service: Vascular;  Laterality: Left;   I & D EXTREMITY Right 02/18/2021   Procedure: IRRIGATION AND DEBRIDEMENT OF ARM;  Surgeon: Dannis Dy, MD;  Location: [facility] OR;  Service: Vascular;  Laterality: Right;   INSERTION OF DIALYSIS CATHETER N/A 05/19/2020   Procedure: TUNNELED INSERTION  OF DIALYSIS CATHETER;  Surgeon: Adine Hoof, MD;  Location: [facility] OR;  Service: Vascular;  Laterality: N/A;   PERIPHERAL VASCULAR BALLOON ANGIOPLASTY  12/15/2023   Procedure: PERIPHERAL VASCULAR BALLOON ANGIOPLASTY;  Surgeon: Patrick Boor,  MD;  Location: [facility] INVASIVE CV LAB;  Service: Cardiovascular;;   THROMBECTOMY AND REVISION OF ARTERIOVENTOUS (AV) GORETEX  GRAFT Left 07/28/2020   Procedure: Oversewing of left arm Brachial cephalic fistula for bleeding.;  Surgeon: Dannis Dy, MD;  Location: [facility] OR;  Service: Vascular;  Laterality: Left;   VENOGRAM Right 01/31/2014   Procedure: DIALYSIS CATHETER;  Surgeon: Margherita Shell, MD;  Location: [facility] CATH LAB;  Service: Cardiovascular;  Laterality: Right;     reports that she has been smoking cigarettes. She has a 1.8 pack-year smoking history. She has been exposed to tobacco smoke. She has never used smokeless tobacco. She reports current alcohol  use. She reports current drug use. Drug: Marijuana.  History reviewed. No pertinent family history.   Physical Exam: Vitals:   05/18/24 2203 05/18/24 2204 05/18/24 2207 05/19/24 0200  BP:  (!) 105/92  (!) 107/56  Pulse:  (!) 106  (!) 109  Resp:  (!) 27  (!) 25  Temp:  97.7 F (36.5 C)  98 F (36.7 C)  TempSrc:  Oral  Oral  SpO2: Aaron Aas)  85% 99%    Weight:   41 kg   Height:   5\' 3"  (1.6 m)     Gen: Awake, alert, chronically ill appearing, appears older than stated age CV: Regular, normal S1, S2, no murmurs, diminished radial pulses Resp: Tachypnea, mild increased work of breathing, on nasal cannula, rales bilaterally Abd: Flat, normoactive, mild diffuse tenderness MSK: Symmetric, no edema.  Joints of the hands without appearance of synovitis Skin: There are distal small ulcerations at the tips of the fingers on the right hand.  Neuro: Alert and interactive  Psych: euthymic, appropriate    Data review:   Labs reviewed, notable for:   VBG 7.30/37 K6.4, -> 5.9 Bicarb 15, AG 28 BUN 87 Creatinine 7.45 history ESRD BNP greater than 4500 High-sensitivity troponin 54 -> 153  Micro:  Results for orders placed or performed during the hospital encounter of 04/17/24  Resp panel by RT-PCR (RSV, Flu A&B, Covid) Anterior Nasal  Swab     Status: None   Collection Time: 04/17/24  5:38 AM   Specimen: Anterior Nasal Swab  Result Value Ref Range Status   SARS Coronavirus 2 by RT PCR NEGATIVE NEGATIVE Final   Influenza A by PCR NEGATIVE NEGATIVE Final   Influenza B by PCR NEGATIVE NEGATIVE Final    Comment: (NOTE) The Xpert Xpress SARS-CoV-2/FLU/RSV plus assay is intended as an aid in the diagnosis of influenza from Nasopharyngeal swab specimens and should not be used as a sole basis for treatment. Nasal washings and aspirates are unacceptable for Xpert Xpress SARS-CoV-2/FLU/RSV testing.  Fact Sheet for Patients: BloggerCourse.com  Fact Sheet for Healthcare Providers: SeriousBroker.it  This test is not yet approved or cleared by the [place]  FDA and has been authorized for detection and/or diagnosis of SARS-CoV-2 by FDA under an Emergency Use Authorization (EUA). This EUA will remain in effect (meaning this test can be used) for the duration of the COVID-19 declaration under Section 564(b)(1) of the Act, 21 U.S.C. section 360bbb-3(b)(1), unless the authorization is terminated or revoked.     Resp Syncytial Virus by PCR NEGATIVE NEGATIVE Final    Comment: (NOTE) Fact Sheet for Patients: BloggerCourse.com  Fact Sheet for Healthcare Providers: SeriousBroker.it  This test is not yet approved or cleared by the [place]  FDA and has been authorized for detection and/or diagnosis of SARS-CoV-2 by FDA under an Emergency Use Authorization (EUA). This EUA will remain in effect (meaning this test can be used) for the duration of the COVID-19 declaration under Section 564(b)(1) of the Act, 21 U.S.C. section 360bbb-3(b)(1), unless the authorization is terminated or revoked.  Performed at [facility], [address] [place], [place] [address]   MRSA Next Gen by PCR, Nasal     Status: Abnormal    Collection Time: 04/19/24  4:47 PM   Specimen: Nasal Mucosa; Nasal Swab  Result Value Ref Range Status   MRSA by PCR Next Gen DETECTED (A) NOT DETECTED Final    Comment: RESULT CALLED TO, READ BACK BY AND VERIFIED WITH: RN NICOLE ATKINS ON 04/19/24 @ 2019 BY DRT (NOTE) The GeneXpert MRSA Assay (FDA approved for NASAL specimens only), is one component of a comprehensive MRSA colonization surveillance program. It is not intended to diagnose MRSA infection nor to guide or monitor treatment for MRSA infections. Test performance is not FDA approved in patients less than 41 years old. Performed at [facility], [address] [place], [place] [address]     Imaging reviewed:  DG Chest 1 View Result Date: 05/18/2024 CLINICAL DATA:  Shortness of breath EXAM: CHEST  1 VIEW COMPARISON:  05/08/2024 FINDINGS: Cardiomegaly with central congestion and mild diffuse interstitial opacity suggestive of edema, slightly improved compared with 05/08/2024. Aortic atherosclerosis. No pneumothorax IMPRESSION: Cardiomegaly with central congestion and mild diffuse interstitial opacity suggestive of edema, slightly improved compared with 05/08/2024. Electronically Signed   By: Esmeralda Hedge M.D.   On: 05/18/2024 22:58     EKG:  Personally reviewed, Afib with RVR rate 128, RBBB, lateral Q waves, peaked T waves, ST depression inferolaterally  ED Course:  Hypoxic with EMS requiring 6 L O2.  EDP consulted nephrology Dr. Britta Candy who will try to arrange urgent dialysis but thinks most likely will be in the morning.  Treated with Lokelma  for hyperkalemia.  Noted to have severe range hypoglycemia treated with D50 and started on D10 drip   Assessment/Plan:  43 y.o. female with hx ESRD on TTS HD, SLE with lupus nephritis, heart failure with reduced ejection fraction, a flutter not on anticoagulation due to prior subdural hematoma, aortic regurgitation, mitral stenosis, prior history of polysubstance use, who  presented due to pain in her hands and feet. Admitted with acute hypoxic respiratory failure in the setting of volume overload from missed dialysis.   History ESRD with missed HD session, on TTS HD Acute hypoxic respiratory failure secondary to pulmonary edema, volume overload Hyperkalemia, improving  AGMA  Elevated BUN without clinical uremia  Missed HD session due to symptoms of pain, diarrhea.  On initial evaluation by EMS hypoxic requiring 6 L of O2. Chest x-ray with pulmonary edema. K 6.4 -> 5.9, + EKG changes with peaked T waves. Bicarb 15, AG 28. BUN 82.  -EDP consulted nephrology Dr. Britta Candy who will try to arrange urgent dialysis but thinks most likely will be in the morning.   -Anuric, volume management by HD  -Continue Lokelma  10 g p.o. 3 times daily tentatively ordered for 2 days, continue as needed -Check lactate with AGMA  -Continue home midodrine  10 mg 3 times daily for blood pressure support -Continue home Sevelamer , Cinacalcet  -Renal diet  Acute exacerbation heart failure with reduced ejection fraction, BiV failure  Moderate to severe aortic regurgitation Mild mitral stenosis Last TTE 12/' 24 LVEF 30 to 35%, global hypokinesis, moderately reduced RV systolic function, mild MS, moderate to severe AI.  BNP greater than 4500.  High-sensitivity troponin elevated per below.  Etiology of heart failure exacerbation suspect mainly secondary to volume overload from missed dialysis, may have component of chronic myocardial injury and tachyarrhythmia contributing as well. -Volume management per HD -Repeat TTE -Intolerant of GDMT due to hypotension  Atrial fibrillation with rapid ventricular response, Initial EKG A-fib rate 128; rates in the ED mainly ~low 100s.  Suspect trigger is volume overload - Continue home amiodarone  200 mg daily.  Rate control acceptable for now, follow response with HD, if worsening rates would start on Amio bolus and drip -Not on anticoagulation due to prior  history of subdural hematoma  Severe range hypoglycemia Question possible pseudohypoglycemia considering she was asymptomatic despite markedly severe hypoglycemia with nadir less than 10.  Not on diabetic medications.  Etiology if this was true hypoglycemia is unclear. - Continue D10 drip reduced rate to 25 cc an hour considering her ESRD and volume overload. - Check cosyntropin  stim test in the morning. - If she is to have another episode of hypoglycemia per fingerstick would draw a stat serum glucose for comparison and then treat  her hypoglycemia protocol  Chronic myocardial injury:  Chronic elevation in HS troponin currently flat at 153 which is similar to prior. Demand in the setting of her volume overload, hypoxia, arrhythmia.  -Management directed at these conditions.  Arthralgias in hands and feet  Question ischemic pain v lupus flare Acute onset of pain without evidence of significant synovitis on exam.  Does have distal digital ulceration especially in the right hand.  Considering her respiratory failure and hypoxia, chronic hypotension question if may have been having ischemic pain in her distal limbs rather than true lupus flare. -Check ESR, DS DNA ab  -If she has evidence of ongoing inflammation and pain does not improve with management of her volume status, correction of her hypoxia then will consider initiation of prednisone  and follow-up with rheumatology as outpatient -Symptomatic management Tylenol  as needed mild, methocarbamol  as needed spasms, can use oxycodone  as needed (currently held off on a standing prn order)   Chronic medical problems: History of polysubstance use: Check serum tox  Body mass index is 16.01 kg/m.  Moderate protein calorie malnutrition  DVT prophylaxis:  SQ Heparin  Code Status:  Full Code Diet:  Diet Orders (From admission, onward)     Start     Ordered   05/19/24 0025  Diet renal with fluid restriction Fluid restriction: 1200 mL Fluid; Room  service appropriate? Yes; Fluid consistency: Thin  Diet effective now       Question Answer Comment  Fluid restriction: 1200 mL Fluid   Room service appropriate? Yes   Fluid consistency: Thin      05/19/24 0027           Family Communication:  No   Consults:  Nephrology   Admission status:   Inpatient, Step Down Unit  Severity of Illness: The appropriate patient status for this patient is INPATIENT. Inpatient status is judged to be reasonable and necessary in order to provide the required intensity of service to ensure the patient's safety. The patient's presenting symptoms, physical exam findings, and initial radiographic and laboratory data in the context of their chronic comorbidities is felt to place them at high risk for further clinical deterioration. Furthermore, it is not anticipated that the patient will be medically stable for discharge from the hospital within 2 midnights of admission.   * I certify that at the point of admission it is my clinical judgment that the patient will require inpatient hospital care spanning beyond 2 midnights from the point of admission due to high intensity of service, high risk for further deterioration and high frequency of surveillance required.*   Arnulfo Larch, MD [facility]  How to contact the [facility] Attending or Consulting provider 7A - 7P or covering provider during after hours 7P -7A, for this patient.  Check the care team in [facility] and look for a) attending/consulting [facility] provider listed and b) the [facility] team listed Log into [email or web address] and use [place]'s universal password to access. If you do not have the password, please contact the hospital operator. Locate the [facility] provider you are looking for under [facility] and page to a number that you can be directly reached. If you still have difficulty reaching the provider, please page the [facility] (Director on Call) for the Hospitalists listed on amion for  assistance.  05/19/2024, 4:34 AM

## 2024-05-19 NOTE — ED Notes (Signed)
 Provider Sherl Diones MD notfied of pt HR temporarily shooting up to 170s, before returning to 120s, after vomiting her amiodarone /lokelma .

## 2024-05-19 NOTE — ED Notes (Addendum)
 Pt otf with RN transport.

## 2024-05-19 NOTE — Progress Notes (Signed)
 Report received from Will, Dialysis Nurse

## 2024-05-19 NOTE — Consult Note (Addendum)
 [facility] Renal Consultation Note    Indication for Consultation:  Management of ESRD/hemodialysis; anemia, hypertension/volume and secondary hyperparathyroidism  ZOX:WRUEAVWU, Amy J, NP  HPI: [name] is a 43 y.o. female with ESRD on HD TTS at [facility]. She has a past medical history significant for SLE, lupus nephritis, percarditis (s/p pericardiocentesis 2006), hx polysubstance abuse, hx medical non-compliance, heart failure with reduced ejection fraction, A-flutter (not on AC due to prior subdural hematoma), aortic regurgitation, and mitral stenosis.  Patient recently hospitalized 05/08/24-05/12/24 for acute hypoxic respiratory failure 2nd volume overload. Unfortunately, she's had multiple hospitalizations for this issue. She returns to the ED c/o worsening bilateral hand and feet pain and ongoing diarrhea. Seen and examined patient at bedside. She is concerned she is having another lupus flare. She appears mildly labored but not in acute respiratory distress. Currently on 4L O2. Denies acute SOB, CP, palpitations, and N/V. Notable labs include: Na 131, K+ 5.1, BUN 87, Ca 7, phos 7.3, and lactic acid 3.8. CXR shows central congestion and mild diffuse interstitial opacity consistent with pulmonary edema. She reports her last HD was on 5/20 and missed HD 5/22 2nd ongoing diarrhea. Noted treatments cut short on 5/17 and 5/20. Plan to dialyze her sometime today.  Past Medical History:  Diagnosis Date   Anemia    low iron  - receives iron  at dialysis   Anxiety    Arthritis    RA   Atrial fibrillation with RVR (HCC) 12/14/2023   Chronic systolic congestive heart failure (HCC) 03/16/2016   Dyspnea    ESRD (end stage renal disease) (HCC)    [facility] _ [place]   H/O pericarditis 01/17/2013   H/O pleural effusion 01/17/2013   Heart murmur    Lupus (systemic lupus erythematosus) (HCC)    Previously followed with Dr. Bernadine Briar, has not followed up  recently   Lupus nephritis [facility]) 2006   Renal biopsy shows segmental endocapillary proliferation and cellular crescent formation (Class IIIA) and lupus membranous glomerulopathy (Class V, stage II)   Pneumonia    many times   Polysubstance abuse (HCC)    cocaine , MJ, tobacco   S/P pericardiocentesis 01/17/2013   H/o pericardial effusion with tamponade 2006    Seizures (HCC)    during pregnancy 1 time   Septic shock (HCC) 12/13/2023   Streptococcal bacteremia 01/23/2013   She had two S. pneumonae bacteremia on 01/21/2013. Sensitive to Peniccilin    Past Surgical History:  Procedure Laterality Date   A/V FISTULAGRAM N/A 12/15/2023   Procedure: A/V Fistulagram;  Surgeon: Patrick Boor, MD;  Location: [facility] INVASIVE CV LAB;  Service: Cardiovascular;  Laterality: N/A;   AV FISTULA PLACEMENT     AV FISTULA PLACEMENT Right 08/01/2020   Procedure: RIGHT ARM BRACHIOCEPHALIC  ARTERIOVENOUS (AV) FISTULA CREATION;  Surgeon: Mayo Speck, MD;  Location: [facility] OR;  Service: Vascular;  Laterality: Right;   BASCILIC VEIN TRANSPOSITION Left 02/05/2014   Procedure: BASCILIC VEIN TRANSPOSITION;  Surgeon: Mayo Speck, MD;  Location: [facility] OR;  Service: Vascular;  Laterality: Left;   BASCILIC VEIN TRANSPOSITION Right 01/08/2021   Procedure: RIGHT ARM SECOND STAGE BASCILIC VEIN TRANSPOSITION;  Surgeon: Mayo Speck, MD;  Location: [facility] OR;  Service: Vascular;  Laterality: Right;   BIOPSY  12/17/2021   Procedure: BIOPSY;  Surgeon: Normie Becton., MD;  Location: [facility] ENDOSCOPY;  Service: Gastroenterology;;   ESOPHAGOGASTRODUODENOSCOPY (EGD) WITH PROPOFOL  N/A 12/17/2021   Procedure: ESOPHAGOGASTRODUODENOSCOPY (EGD) WITH PROPOFOL ;  Surgeon:  Mansouraty, Albino Alu., MD;  Location: [facility] ENDOSCOPY;  Service: Gastroenterology;  Laterality: N/A;   FISTULA SUPERFICIALIZATION Left 05/30/2018   Procedure: FISTULA PLICATION BASILIC VEIN TRANSPOSITION;  Surgeon: Dannis Dy, MD;  Location: [facility] OR;  Service: Vascular;   Laterality: Left;   FISTULA SUPERFICIALIZATION Left 12/18/2019   Procedure: PLICATION OF LEFT ARTERIOVENOUS FISTULA ULCER;  Surgeon: Mayo Speck, MD;  Location: [facility] OR;  Service: Vascular;  Laterality: Left;   I & D EXTREMITY Right 02/18/2021   Procedure: IRRIGATION AND DEBRIDEMENT OF ARM;  Surgeon: Dannis Dy, MD;  Location: [facility] OR;  Service: Vascular;  Laterality: Right;   INSERTION OF DIALYSIS CATHETER N/A 05/19/2020   Procedure: TUNNELED INSERTION  OF DIALYSIS CATHETER;  Surgeon: Adine Hoof, MD;  Location: [facility] OR;  Service: Vascular;  Laterality: N/A;   PERIPHERAL VASCULAR BALLOON ANGIOPLASTY  12/15/2023   Procedure: PERIPHERAL VASCULAR BALLOON ANGIOPLASTY;  Surgeon: Patrick Boor, MD;  Location: [facility] INVASIVE CV LAB;  Service: Cardiovascular;;   THROMBECTOMY AND REVISION OF ARTERIOVENTOUS (AV) GORETEX  GRAFT Left 07/28/2020   Procedure: Oversewing of left arm Brachial cephalic fistula for bleeding.;  Surgeon: Dannis Dy, MD;  Location: [facility] OR;  Service: Vascular;  Laterality: Left;   VENOGRAM Right 01/31/2014   Procedure: DIALYSIS CATHETER;  Surgeon: Margherita Shell, MD;  Location: [facility] CATH LAB;  Service: Cardiovascular;  Laterality: Right;   History reviewed. No pertinent family history. Social History:  reports that she has been smoking cigarettes. She has a 1.8 pack-year smoking history. She has been exposed to tobacco smoke. She has never used smokeless tobacco. She reports current alcohol  use. She reports current drug use. Drug: Marijuana. Allergies  Allergen Reactions   Cephalosporins Rash and Other (See Comments)    To both keflex  and cefazolin .  Potential bullous lesion from Ceftriaxone  11/2023.   Tobramycin Sulfate Swelling    Eye swelling   Vancomycin  Swelling   Prior to Admission medications   Medication Sig Start Date End Date Taking? Authorizing Provider  acetaminophen  (TYLENOL ) 500 MG tablet Take 2 tablets (1,000 mg total) by mouth 4 (four)  times daily. 05/12/24  Yes Swayze, Ava, DO  amiodarone  (PACERONE ) 200 MG tablet Take 1 tablet (200 mg total) by mouth daily. 04/24/24  Yes Ghimire, Kuber, MD  b complex-vitamin c-folic acid  (NEPHRO-VITE) 0.8 MG TABS tablet Take 1 tablet by mouth as needed (Supplement).   Yes [provider]  cinacalcet  (SENSIPAR ) 30 MG tablet Take 1 tablet (30 mg total) by mouth every evening. Patient taking differently: Take 30 mg by mouth daily. 04/21/24  Yes Vada Garibaldi, MD  LOPERAMIDE  HCL PO Take 4 mg by mouth See admin instructions. As needed on dialysis days 04/27/24 04/26/25 Yes [provider]  Menthol , Topical Analgesic, (BIOFREEZE) 10 % CREA Apply topically.   Yes [provider]  midodrine  (PROAMATINE ) 10 MG tablet Take 1 tablet (10 mg total) by mouth 3 (three) times daily with meals. 05/12/24  Yes Swayze, Ava, DO  sevelamer  carbonate (RENVELA ) 800 MG tablet Take 4 tablets (3,200 mg total) by mouth 3 (three) times daily with meals. Patient taking differently: Take 1,600-3,200 mg by mouth 3 (three) times daily with meals. Take three tablets (3200mg ) by mouth with meals and two tablets (1600mg ) with snacks 05/12/24  Yes Swayze, Ava, DO   Current Facility-Administered Medications  Medication Dose Route Frequency Provider Last Rate Last Admin   acetaminophen  (TYLENOL ) tablet 1,000 mg  1,000 mg Oral Q6H PRN Arnulfo Larch, MD  albuterol  (PROVENTIL ) (2.5 MG/3ML) 0.083% nebulizer solution 2.5 mg  2.5 mg Nebulization Q6H PRN Arnulfo Larch, MD       amiodarone  (PACERONE ) tablet 200 mg  200 mg Oral Daily Segars, Jonathan, MD   200 mg at 05/19/24 0865   camphor-menthol  (SARNA) lotion   Topical PRN Segars, Jonathan, MD       Chlorhexidine  Gluconate Cloth 2 % PADS 6 each  6 each Topical Q0600 Levorn Reason, MD       cinacalcet  (SENSIPAR ) tablet 30 mg  30 mg Oral QPM Segars, Jonathan, MD       dextrose  10 % infusion   Intravenous Continuous Segars, Jonathan, MD 25 mL/hr at 05/19/24  0830 Rate Change at 05/19/24 0830   heparin  injection 5,000 Units  5,000 Units Subcutaneous Q8H Segars, Jonathan, MD   5,000 Units at 05/19/24 7846   hydrOXYzine  (ATARAX ) tablet 10 mg  10 mg Oral Q6H PRN Segars, Jonathan, MD   10 mg at 05/19/24 0326   melatonin tablet 6 mg  6 mg Oral QHS PRN Segars, Jonathan, MD       methocarbamol  (ROBAXIN ) tablet 500 mg  500 mg Oral Q8H PRN Segars, Jonathan, MD   500 mg at 05/19/24 0326   midodrine  (PROAMATINE ) tablet 10 mg  10 mg Oral TID WC Segars, Jonathan, MD   10 mg at 05/19/24 0858   sevelamer  carbonate (RENVELA ) tablet 3,200 mg  3,200 mg Oral TID WC Young Hensen, RPH   3,200 mg at 05/19/24 9629   And   sevelamer  carbonate (RENVELA ) tablet 1,600 mg  1,600 mg Oral With snacks Young Hensen, [facility]       sodium chloride  flush (NS) 0.9 % injection 3 mL  3 mL Intravenous Q12H Segars, Arlyce Lambert, MD       sodium zirconium cyclosilicate  (LOKELMA ) packet 10 g  10 g Oral TID Segars, Jonathan, MD   10 g at 05/19/24 5284   Current Outpatient Medications  Medication Sig Dispense Refill   acetaminophen  (TYLENOL ) 500 MG tablet Take 2 tablets (1,000 mg total) by mouth 4 (four) times daily. 30 tablet 0   amiodarone  (PACERONE ) 200 MG tablet Take 1 tablet (200 mg total) by mouth daily. 30 tablet 2   b complex-vitamin c-folic acid  (NEPHRO-VITE) 0.8 MG TABS tablet Take 1 tablet by mouth as needed (Supplement).     cinacalcet  (SENSIPAR ) 30 MG tablet Take 1 tablet (30 mg total) by mouth every evening. (Patient taking differently: Take 30 mg by mouth daily.) 60 tablet 0   LOPERAMIDE  HCL PO Take 4 mg by mouth See admin instructions. As needed on dialysis days     Menthol , Topical Analgesic, (BIOFREEZE) 10 % CREA Apply topically.     midodrine  (PROAMATINE ) 10 MG tablet Take 1 tablet (10 mg total) by mouth 3 (three) times daily with meals. 90 tablet 0   sevelamer  carbonate (RENVELA ) 800 MG tablet Take 4 tablets (3,200 mg total) by mouth 3 (three) times daily with meals. (Patient  taking differently: Take 1,600-3,200 mg by mouth 3 (three) times daily with meals. Take three tablets (3200mg ) by mouth with meals and two tablets (1600mg ) with snacks) 360 tablet 0   Labs: Basic Metabolic Panel: Recent Labs  Lab 05/18/24 2216 05/18/24 2358 05/19/24 0909  NA 134*  134* 134* 131*  K 6.4*  6.3* 5.9* 5.1  CL 100 91* 89*  CO2  --  15* 19*  GLUCOSE 26* 116* 326*  BUN 82* 87* 87*  CREATININE 7.70* 7.45* 7.29*  CALCIUM   --  7.3* 7.0*  PHOS  --   --  7.3*   Liver Function Tests: No results for input(s): "AST", "ALT", "ALKPHOS", "BILITOT", "PROT", "ALBUMIN " in the last 168 hours. No results for input(s): "LIPASE", "AMYLASE" in the last 168 hours. No results for input(s): "AMMONIA" in the last 168 hours. CBC: Recent Labs  Lab 05/18/24 2210 05/18/24 2216 05/19/24 0618  WBC 5.7  --  5.2  HGB 13.5 15.6*  15.3* 10.8*  HCT 43.3 46.0  45.0 33.7*  MCV 91.5  --  89.4  PLT 234  --  204   Cardiac Enzymes: No results for input(s): "CKTOTAL", "CKMB", "CKMBINDEX", "TROPONINI" in the last 168 hours. CBG: Recent Labs  Lab 05/18/24 2231 05/18/24 2249 05/19/24 0019 05/19/24 0740  GLUCAP <10* 109* 149* 97   Iron  Studies: No results for input(s): "IRON ", "TIBC", "TRANSFERRIN", "FERRITIN" in the last 72 hours. Studies/Results: ECHOCARDIOGRAM COMPLETE Result Date: 05/19/2024    ECHOCARDIOGRAM REPORT   Patient Name:   [name] Date of Exam: 05/19/2024 Medical Rec #:  [id number]      Height:       63.0 in Accession #:    [id number]     Weight:       90.4 lb Date of Birth:  October 05, 1981     BSA:          1.380 m Patient Age:    42 years       BP:           96/55 mmHg Patient Gender: F              HR:           98 bpm. Exam Location:  Inpatient Procedure: 2D Echo, Cardiac Doppler and Color Doppler (Both Spectral and Color            Flow Doppler were utilized during procedure). Indications:    Congestive Heart Failure I50.9  History:        Patient has prior history of  Echocardiogram examinations, most                 recent 12/14/2023. CHF, Mitral Valve Disease, Arrythmias:Atrial                 Fibrillation; Risk Factors:Hypertension.  Sonographer:    Kip Peon RDCS Referring Phys: [id number] JONATHAN SEGARS IMPRESSIONS  1. Left ventricular ejection fraction, by estimation, is 35 to 40%. The left ventricle has moderately decreased function. The left ventricle demonstrates global hypokinesis. The left ventricular internal cavity size was moderately dilated. There is mild  left ventricular hypertrophy. Left ventricular diastolic parameters are indeterminate. There is the interventricular septum is flattened in systole and diastole, consistent with right ventricular pressure and volume overload.  2. Right ventricular systolic function is severely reduced. The right ventricular size is moderately enlarged.  3. Left atrial size was severely dilated.  4. Right atrial size was severely dilated.  5. The mitral valve is rheumatic. Moderate mitral valve regurgitation. Mild mitral stenosis. Moderate mitral annular calcification.  6. Tricuspid valve regurgitation is moderate to severe.  7. The aortic valve is tricuspid. Aortic valve regurgitation is severe. Aortic valve sclerosis/calcification is present, without any evidence of aortic stenosis.  8. The inferior vena cava is dilated in size with <50% respiratory variability, suggesting right atrial pressure of 15 mmHg. FINDINGS  Left Ventricle: Left ventricular ejection fraction, by estimation, is 35 to 40%. The left ventricle has moderately decreased function. The left  ventricle demonstrates global hypokinesis. The left ventricular internal cavity size was moderately dilated. There is mild left ventricular hypertrophy. The interventricular septum is flattened in systole and diastole, consistent with right ventricular pressure and volume overload. Left ventricular diastolic parameters are indeterminate. Right Ventricle: The right  ventricular size is moderately enlarged. Right ventricular systolic function is severely reduced. Left Atrium: Left atrial size was severely dilated. Right Atrium: Right atrial size was severely dilated. Pericardium: Trivial pericardial effusion is present. Mitral Valve: The mitral valve is rheumatic. Moderate mitral annular calcification. Moderate mitral valve regurgitation. Mild mitral valve stenosis. MV peak gradient, 15.1 mmHg. The mean mitral valve gradient is 6.0 mmHg. Tricuspid Valve: The tricuspid valve is normal in structure. Tricuspid valve regurgitation is moderate to severe. No evidence of tricuspid stenosis. Aortic Valve: The aortic valve is tricuspid. Aortic valve regurgitation is severe. Aortic valve sclerosis/calcification is present, without any evidence of aortic stenosis. Pulmonic Valve: The pulmonic valve was normal in structure. Pulmonic valve regurgitation is trivial. No evidence of pulmonic stenosis. Aorta: The aortic root is normal in size and structure. Venous: The inferior vena cava is dilated in size with less than 50% respiratory variability, suggesting right atrial pressure of 15 mmHg. IAS/Shunts: There is left bowing of the interatrial septum, suggestive of elevated right atrial pressure. No atrial level shunt detected by color flow Doppler.  LEFT VENTRICLE PLAX 2D LVIDd:         6.00 cm LVIDs:         4.90 cm LV PW:         1.20 cm LV IVS:        1.30 cm LVOT diam:     1.90 cm LV SV:         62 LV SV Index:   45 LVOT Area:     2.84 cm  RIGHT VENTRICLE            IVC RV Basal diam:  5.75 cm    IVC diam: 2.30 cm RV Mid diam:    5.20 cm RV S prime:     8.31 cm/s TAPSE (M-mode): 1.3 cm LEFT ATRIUM           Index        RIGHT ATRIUM           Index LA diam:      6.20 cm 4.49 cm/m   RA Area:     31.80 cm LA Vol (A4C): 68.6 ml 49.72 ml/m  RA Volume:   118.00 ml 85.53 ml/m  AORTIC VALVE LVOT Vmax:   145.00 cm/s LVOT Vmean:  99.200 cm/s LVOT VTI:    0.217 m  AORTA Ao Root diam: 2.80 cm Ao  Asc diam:  2.90 cm MITRAL VALVE MV Area VTI:  1.60 cm    SHUNTS MV Peak grad: 15.1 mmHg   Systemic VTI:  0.22 m MV Mean grad: 6.0 mmHg    Systemic Diam: 1.90 cm MV Vmax:      1.94 m/s MV Vmean:     110.5 cm/s Alexandria Angel MD Electronically signed by Alexandria Angel MD Signature Date/Time: 05/19/2024/11:00:52 AM    Final    DG Chest 1 View Result Date: 05/18/2024 CLINICAL DATA:  Shortness of breath EXAM: CHEST  1 VIEW COMPARISON:  05/08/2024 FINDINGS: Cardiomegaly with central congestion and mild diffuse interstitial opacity suggestive of edema, slightly improved compared with 05/08/2024. Aortic atherosclerosis. No pneumothorax IMPRESSION: Cardiomegaly with central congestion and mild diffuse interstitial opacity suggestive of edema, slightly improved  compared with 05/08/2024. Electronically Signed   By: Esmeralda Hedge M.D.   On: 05/18/2024 22:58    ROS: All others negative except those listed in HPI.   Physical Exam: Vitals:   05/19/24 0930 05/19/24 0945 05/19/24 1000 05/19/24 1015  BP: 95/62 (!) 105/56 (!) 122/57 (!) 97/58  Pulse: 100 (!) 110 60 (!) 108  Resp: (!) 28 (!) 23 18 (!) 25  Temp:      TempSrc:      SpO2: 100% (!) 87% 99% 100%  Weight:      Height:         General: WDWN NAD Head: Sclera not icteric  Lungs: Diminished bilateral lower lobes. No wheeze, rales or rhonchi. Breathing is unlabored. Heart: RRR. No murmur, rubs or gallops.  Abdomen: soft and non-tender Lower extremities: No b/l edema Neuro: AAOx3. Moves all extremities spontaneously. Dialysis Access: R AVF (+) B/T  Dialysis Orders:  TTS - [facility] 3hrs45min, BFR 400, DFR Auto 1.5,  EDW 37.5kg (just lowered), 2K/ 2Ca Heparin  None-noted hx subdural hematoma Mircera 30 mcg q4wks - last 05/02/24 Hectorol  1mcg IV qHD - last 05/16/24  Assessment/Plan: Acute hypoxic respiratory failure/volume overload - recently discharged for this issue. Missed HD 5/22. Plan for dialysis sometime today.  ESRD - on  HD TTS. Current K+ 5.1 and Lokema X 2 doses already ordered. Plan for HD sometime today.  Chronic hypotension/volume - Bps are soft, on Midodrine  TID. CXR shows central congestion and mild diffuse interstitial opacity consistent with pulmonary edema. See #1 Anemia of CKD - Hgb 10.8. Hold Fe/ESA for now but will restart if needed Secondary Hyperparathyroidism -  Sensipar  and Renvela  resumed.  Nutrition - Renal diet with fluid restriction GOC - It was noted of our team discussing with her in outpatient about her overall health decline. I discussed this with her again today and she reports wanting to continue with dialysis. She states: "stopping dialysis is not an option and stopping dialysis is like killing herself".   [name] Maxwell, NP [facility] 05/19/2024, 11:54 AM     Seen and examined independently.  Agree with note and exam as documented above by physician extender and as noted here.  [name] is a pleasant female with a history of ESRD, chronic hypotension, and heart failure who presented to the ER with severe hand pain from Raynaud's.  She has also had diarrhea - which is thankfully better now - and nausea.  She missed HD on Thursday due to the diarrhea.  She is short of breath.  She was titrated up to 6 liters oxygen.  Note that her outpatient nephrologist has had goals of care discussions with her as her chronic hypotension limits our ability to remove fluid and she has had multiple recent admissions.  Concern that she is poorly tolerating HD.  She states that she wants to continue with HD at this time.   General adult female in bed in no acute distress HEENT normocephalic atraumatic extraocular movements intact sclera anicteric Neck supple trachea midline Lungs clear to auscultation bilaterally normal work of breathing at rest  Heart S1S2 no rub Abdomen soft nontender nondistended Extremities no pitting edema  Psych normal mood and affect Access RUE AVF in  use  # Acute hypoxic respiratory failure - fluid overload - optimize volume status with HD - albumin  to augment UF  # ESRD on HD - HD today and then per TTS schedule to optimize   # Chronic hypotension  -  on midodrine  TID  - albumin  to augment UF   # Hyperkalemia  - Improved with HD and temporizing measures   # Anemia of CKD  - Hb acceptable at this time   # Metabolic bone disease/secondary hyperparathyroidism - on hectorol  outpatient  - hyperphos - resume home binders when tolerating PO   Thank you for the consult.  Please do not hesitate to contact me with any questions    Nan Aver, MD 05/19/2024  3:38 PM

## 2024-05-20 DIAGNOSIS — J9601 Acute respiratory failure with hypoxia: Secondary | ICD-10-CM | POA: Diagnosis not present

## 2024-05-20 LAB — CBC WITH DIFFERENTIAL/PLATELET
Abs Immature Granulocytes: 0.02 10*3/uL (ref 0.00–0.07)
Basophils Absolute: 0 10*3/uL (ref 0.0–0.1)
Basophils Relative: 1 %
Eosinophils Absolute: 0 10*3/uL (ref 0.0–0.5)
Eosinophils Relative: 1 %
HCT: 38.2 % (ref 36.0–46.0)
Hemoglobin: 12.6 g/dL (ref 12.0–15.0)
Immature Granulocytes: 0 %
Lymphocytes Relative: 15 %
Lymphs Abs: 0.9 10*3/uL (ref 0.7–4.0)
MCH: 28.9 pg (ref 26.0–34.0)
MCHC: 33 g/dL (ref 30.0–36.0)
MCV: 87.6 fL (ref 80.0–100.0)
Monocytes Absolute: 0.8 10*3/uL (ref 0.1–1.0)
Monocytes Relative: 12 %
Neutro Abs: 4.5 10*3/uL (ref 1.7–7.7)
Neutrophils Relative %: 71 %
Platelets: 257 10*3/uL (ref 150–400)
RBC: 4.36 MIL/uL (ref 3.87–5.11)
RDW: 19.6 % — ABNORMAL HIGH (ref 11.5–15.5)
WBC: 6.3 10*3/uL (ref 4.0–10.5)
nRBC: 3.5 % — ABNORMAL HIGH (ref 0.0–0.2)

## 2024-05-20 LAB — GLUCOSE, CAPILLARY
Glucose-Capillary: 100 mg/dL — ABNORMAL HIGH (ref 70–99)
Glucose-Capillary: 83 mg/dL (ref 70–99)
Glucose-Capillary: 133 mg/dL — ABNORMAL HIGH (ref 70–99)
Glucose-Capillary: 95 mg/dL (ref 70–99)

## 2024-05-20 LAB — LACTIC ACID, PLASMA
Lactic Acid, Venous: 2.6 mmol/L (ref 0.5–1.9)
Lactic Acid, Venous: 1.5 mmol/L (ref 0.5–1.9)

## 2024-05-20 LAB — RENAL FUNCTION PANEL
Albumin: 3.1 g/dL — ABNORMAL LOW (ref 3.5–5.0)
Anion gap: 18 — ABNORMAL HIGH (ref 5–15)
BUN: 49 mg/dL — ABNORMAL HIGH (ref 6–20)
CO2: 24 mmol/L (ref 22–32)
Calcium: 7.6 mg/dL — ABNORMAL LOW (ref 8.9–10.3)
Chloride: 92 mmol/L — ABNORMAL LOW (ref 98–111)
Creatinine, Ser: 4.46 mg/dL — ABNORMAL HIGH (ref 0.44–1.00)
GFR, Estimated: 12 mL/min — ABNORMAL LOW (ref >60)
Glucose, Bld: 83 mg/dL (ref 70–99)
Phosphorus: 4 mg/dL (ref 2.5–4.6)
Potassium: 4 mmol/L (ref 3.5–5.1)
Sodium: 134 mmol/L — ABNORMAL LOW (ref 135–145)

## 2024-05-20 LAB — MAGNESIUM: Magnesium: 2.3 mg/dL (ref 1.7–2.4)

## 2024-05-20 LAB — MISC LABCORP TEST (SEND OUT): Labcorp test code: 6510

## 2024-05-20 LAB — SEDIMENTATION RATE: Sed Rate: 25 mm/h — ABNORMAL HIGH (ref 0–22)

## 2024-05-20 LAB — HEPATITIS B SURFACE ANTIBODY, QUANTITATIVE: Hep B S AB Quant (Post): 68.1 m[IU]/mL

## 2024-05-20 LAB — C-REACTIVE PROTEIN: CRP: 8.1 mg/dL — ABNORMAL HIGH (ref <1.0)

## 2024-05-20 MED ORDER — PENTAFLUOROPROP-TETRAFLUOROETH EX AERO: 1.0000 | INHALATION_SPRAY | CUTANEOUS | Status: DC | PRN

## 2024-05-20 MED ORDER — LIDOCAINE-PRILOCAINE 2.5-2.5 % EX CREA: 1.0000 | TOPICAL_CREAM | CUTANEOUS | Status: DC | PRN

## 2024-05-20 MED ORDER — ANTICOAGULANT SODIUM CITRATE 4% (200MG/5ML) IV SOLN: 5.0000 mL | Status: DC | PRN

## 2024-05-20 MED ORDER — ALTEPLASE 2 MG IJ SOLR: 2.0000 mg | Freq: Once | INTRAMUSCULAR | Status: DC | PRN

## 2024-05-20 MED ORDER — HEPARIN SODIUM (PORCINE) 1000 UNIT/ML DIALYSIS: 1000.0000 [IU] | INTRAMUSCULAR | Status: DC | PRN

## 2024-05-20 MED ORDER — OXYCODONE HCL 5 MG PO TABS
5.0000 mg | ORAL_TABLET | Freq: Four times a day (QID) | ORAL | Status: AC | PRN
  Administered 2024-05-20 – 2024-05-21 (×4): 5 mg via ORAL
  Filled 2024-05-20 (×4): qty 1

## 2024-05-20 MED ORDER — LIDOCAINE HCL (PF) 1 % IJ SOLN: 5.0000 mL | INTRAMUSCULAR | Status: DC | PRN

## 2024-05-20 NOTE — Progress Notes (Signed)
 PROGRESS NOTE    [name]  ZOX:[id number] DOB: 1981-04-16 DOA: 05/18/2024 PCP: Senaida Dama, NP  Outpatient Specialists:     Brief Narrative:  43 y.o. female with hx ESRD on TTS HD, SLE with lupus nephritis, heart failure with reduced ejection fraction, a flutter not on anticoagulation due to prior subdural hematoma, aortic regurgitation, mitral stenosis, prior history of polysubstance use.  Admitted earlier today with Chronic SOB, pulmonary edema, worsening pain in hands and feet (possible lupus flare), hyperkalemia, hypocalcemia, hyperphosphatemia, acidosis and severe hyperglycemia.  Patient also missed hemodialysis.  05/19/2024: Patient seen.  ECHO is in progress.  No significant history from the patient. ? Parotid swelling.  Low threshold to consult the Palliative care team.  Guarded prognosis.  Assessment & Plan:   Active Problems:   ESRD on dialysis (HCC)   Acute on chronic systolic CHF (congestive heart failure) (HCC)   Atrial fibrillation with RVR (HCC)   Acute hypoxemic respiratory failure (HCC)   Volume overload   Hypoglycemia   Dyspnea   History ESRD with missed HD session, on TTS HD Acute hypoxic respiratory failure secondary to pulmonary edema, volume overload Hyperkalemia, improving  AGMA  Elevated BUN without clinical uremia  Missed HD session due to symptoms of pain, diarrhea.  On initial evaluation by EMS hypoxic requiring 6 L of O2. Chest x-ray with pulmonary edema. K 6.4 -> 5.9, + EKG changes with peaked T waves. Bicarb 15, AG 28. BUN 82.  -EDP consulted nephrology Dr. Britta Candy who will try to arrange urgent dialysis but thinks most likely will be in the morning.   -Anuric, volume management by HD  -Continue Lokelma  10 g p.o. 3 times daily tentatively ordered for 2 days, continue as needed -Check lactate with AGMA  -Continue home midodrine  10 mg 3 times daily for blood pressure support -Continue home Sevelamer , Cinacalcet  -Renal diet 05/19/2024:  Awaiting HD   Acute exacerbation heart failure with reduced ejection fraction, BiV failure  Moderate to severe aortic regurgitation Mild mitral stenosis Last TTE 12/' 24 LVEF 30 to 35%, global hypokinesis, moderately reduced RV systolic function, mild MS, moderate to severe AI.  BNP greater than 4500.  High-sensitivity troponin elevated per below.  Etiology of heart failure exacerbation suspect mainly secondary to volume overload from missed dialysis, may have component of chronic myocardial injury and tachyarrhythmia contributing as well. -Volume management per HD -Repeat TTE -Intolerant of GDMT due to hypotension 05/19/2024:  Low threshold to consult the cardiology team.   Atrial fibrillation with rapid ventricular response, Initial EKG A-fib rate 128; rates in the ED mainly ~low 100s.  Suspect trigger is volume overload - Continue home amiodarone  200 mg daily.  Rate control acceptable for now, follow response with HD, if worsening rates would start on Amio bolus and drip -Not on anticoagulation due to prior history of subdural hematoma   Severe range hypoglycemia Question possible pseudohypoglycemia considering she was asymptomatic despite markedly severe hypoglycemia with nadir less than 10.  Not on diabetic medications.  Etiology if this was true hypoglycemia is unclear. - Continue D10 drip reduced rate to 25 cc an hour considering her ESRD and volume overload. - Check cosyntropin  stim test in the morning. - If she is to have another episode of hypoglycemia per fingerstick would draw a stat serum glucose for comparison and then treat her hypoglycemia protocol 05/19/2024: Continue to monitor closely.   Chronic myocardial injury:  Chronic elevation in HS troponin currently flat at 153 which is similar to prior.  Demand in the setting of her volume overload, hypoxia, arrhythmia.  -Management directed at these conditions.   Arthralgias in hands and feet  Question ischemic pain v lupus  flare Acute onset of pain without evidence of significant synovitis on exam.  Does have distal digital ulceration especially in the right hand.  Considering her respiratory failure and hypoxia, chronic hypotension question if may have been having ischemic pain in her distal limbs rather than true lupus flare. -Check ESR, DS DNA ab  -If she has evidence of ongoing inflammation and pain does not improve with management of her volume status, correction of her hypoxia then will consider initiation of prednisone  and follow-up with rheumatology as outpatient -Symptomatic management Tylenol  as needed mild, methocarbamol  as needed spasms, can use oxycodone  as needed (currently held off on a standing prn order)  05/19/2024: Follow ESR and double-stranded DNA antibody.  Low threshold to start patient on prednisone .   Chronic medical problems: History of polysubstance use: Check serum tox   Body mass index is 16.01 kg/m.  Moderate protein calorie malnutrition   DVT prophylaxis: [place] Heparin  Code Status: Full code.  Family Communication:  Disposition Plan: Inpatient   Consultants:  Nephrology  Procedures:  ECHO (Follow result)  Antimicrobials:  None   Subjective: No history  Objective: Vitals:   05/20/24 0127 05/20/24 0535 05/20/24 0743 05/20/24 1100  BP: (!) 96/55 (!) 108/56 (!) 106/52 (!) (P) 92/59  Pulse: (!) 105 100 96 (!) (P) 106  Resp: 20 20 20  (P) 20  Temp: 97.6 F (36.4 C) 97.7 F (36.5 C) 98 F (36.7 C)   TempSrc: Oral Oral Oral   SpO2: 94%  93% (P) 94%  Weight:  38.5 kg    Height:        Intake/Output Summary (Last 24 hours) at 05/20/2024 1316 Last data filed at 05/19/2024 2109 Gross per 24 hour  Intake 118 ml  Output 3000 ml  Net -2882 ml   Filed Weights   05/18/24 2207 05/19/24 1443 05/20/24 0535  Weight: 41 kg 41 kg 38.5 kg    Examination:  General exam: Cachectic. No history  Respiratory system: Clear to auscultation.   Cardiovascular system: S1 & S2  heard, tachycardic Gastrointestinal system:  Central nervous system: No history from patient.  No communication Extremities:   Data Reviewed: I have personally reviewed following labs and imaging studies  CBC: Recent Labs  Lab 05/18/24 2210 05/18/24 2216 05/19/24 0618 05/20/24 0049  WBC 5.7  --  5.2 6.3  NEUTROABS  --   --   --  4.5  HGB 13.5 15.6*  15.3* 10.8* 12.6  HCT 43.3 46.0  45.0 33.7* 38.2  MCV 91.5  --  89.4 87.6  PLT 234  --  204 257   Basic Metabolic Panel: Recent Labs  Lab 05/18/24 2216 05/18/24 2358 05/19/24 0909 05/20/24 0049  NA 134*  134* 134* 131* 134*  K 6.4*  6.3* 5.9* 5.1 4.0  CL 100 91* 89* 92*  CO2  --  15* 19* 24  GLUCOSE 26* 116* 326* 83  BUN 82* 87* 87* 49*  CREATININE 7.70* 7.45* 7.29* 4.46*  CALCIUM   --  7.3* 7.0* 7.6*  MG  --   --  2.4 2.3  PHOS  --   --  7.3* 4.0   GFR: Estimated Creatinine Clearance: 10.2 mL/min (A) (by C-G formula based on SCr of 4.46 mg/dL (H)). Liver Function Tests: Recent Labs  Lab 05/20/24 0049  ALBUMIN  3.1*   No  results for input(s): "LIPASE", "AMYLASE" in the last 168 hours. No results for input(s): "AMMONIA" in the last 168 hours. Coagulation Profile: No results for input(s): "INR", "PROTIME" in the last 168 hours. Cardiac Enzymes: No results for input(s): "CKTOTAL", "CKMB", "CKMBINDEX", "TROPONINI" in the last 168 hours. BNP (last 3 results) No results for input(s): "PROBNP" in the last 8760 hours. HbA1C: No results for input(s): "HGBA1C" in the last 72 hours. CBG: Recent Labs  Lab 05/19/24 0740 05/19/24 1309 05/19/24 2054 05/20/24 0620 05/20/24 1131  GLUCAP 97 71 91 100* 83   Lipid Profile: No results for input(s): "CHOL", "HDL", "LDLCALC", "TRIG", "CHOLHDL", "LDLDIRECT" in the last 72 hours. Thyroid Function Tests: No results for input(s): "TSH", "T4TOTAL", "FREET4", "T3FREE", "THYROIDAB" in the last 72 hours. Anemia Panel: No results for input(s): "VITAMINB12", "FOLATE", "FERRITIN",  "TIBC", "IRON ", "RETICCTPCT" in the last 72 hours. Urine analysis:    Component Value Date/Time   COLORURINE YELLOW 09/25/2015 1412   APPEARANCEUR CLOUDY (A) 09/25/2015 1412   LABSPEC 1.018 09/25/2015 1412   PHURINE 8.0 09/25/2015 1412   GLUCOSEU NEGATIVE 09/25/2015 1412   HGBUR LARGE (A) 09/25/2015 1412   BILIRUBINUR NEGATIVE 09/25/2015 1412   KETONESUR NEGATIVE 09/25/2015 1412   PROTEINUR >300 (A) 09/25/2015 1412   UROBILINOGEN 0.2 09/25/2015 1412   NITRITE NEGATIVE 09/25/2015 1412   LEUKOCYTESUR MODERATE (A) 09/25/2015 1412   Sepsis Labs: @LABRCNTIP (procalcitonin:4,lacticidven:4)  ) Recent Results (from the past 240 hours)  Resp panel by RT-PCR (RSV, Flu A&B, Covid) Anterior Nasal Swab     Status: None   Collection Time: 05/19/24 12:28 AM   Specimen: Anterior Nasal Swab  Result Value Ref Range Status   SARS Coronavirus 2 by RT PCR NEGATIVE NEGATIVE Final   Influenza A by PCR NEGATIVE NEGATIVE Final   Influenza B by PCR NEGATIVE NEGATIVE Final    Comment: (NOTE) The Xpert Xpress SARS-CoV-2/FLU/RSV plus assay is intended as an aid in the diagnosis of influenza from Nasopharyngeal swab specimens and should not be used as a sole basis for treatment. Nasal washings and aspirates are unacceptable for Xpert Xpress SARS-CoV-2/FLU/RSV testing.  Fact Sheet for Patients: BloggerCourse.com  Fact Sheet for Healthcare Providers: SeriousBroker.it  This test is not yet approved or cleared by the [place]  FDA and has been authorized for detection and/or diagnosis of SARS-CoV-2 by FDA under an Emergency Use Authorization (EUA). This EUA will remain in effect (meaning this test can be used) for the duration of the COVID-19 declaration under Section 564(b)(1) of the Act, 21 U.S.C. section 360bbb-3(b)(1), unless the authorization is terminated or revoked.     Resp Syncytial Virus by PCR NEGATIVE NEGATIVE Final    Comment:  (NOTE) Fact Sheet for Patients: BloggerCourse.com  Fact Sheet for Healthcare Providers: SeriousBroker.it  This test is not yet approved or cleared by the [place]  FDA and has been authorized for detection and/or diagnosis of SARS-CoV-2 by FDA under an Emergency Use Authorization (EUA). This EUA will remain in effect (meaning this test can be used) for the duration of the COVID-19 declaration under Section 564(b)(1) of the Act, 21 U.S.C. section 360bbb-3(b)(1), unless the authorization is terminated or revoked.  Performed at [facility], [address] [place], [place] [address]          Radiology Studies: ECHOCARDIOGRAM COMPLETE Result Date: 05/19/2024    ECHOCARDIOGRAM REPORT   Patient Name:   [name] Date of Exam: 05/19/2024 Medical Rec #:  [id number]      Height:  63.0 in Accession #:    [id number]     Weight:       90.4 lb Date of Birth:  12/11/1981     BSA:          1.380 m Patient Age:    42 years       BP:           96/55 mmHg Patient Gender: F              HR:           98 bpm. Exam Location:  Inpatient Procedure: 2D Echo, Cardiac Doppler and Color Doppler (Both Spectral and Color            Flow Doppler were utilized during procedure). Indications:    Congestive Heart Failure I50.9  History:        Patient has prior history of Echocardiogram examinations, most                 recent 12/14/2023. CHF, Mitral Valve Disease, Arrythmias:Atrial                 Fibrillation; Risk Factors:Hypertension.  Sonographer:    Kip Peon RDCS Referring Phys: [id number] JONATHAN SEGARS IMPRESSIONS  1. Left ventricular ejection fraction, by estimation, is 35 to 40%. The left ventricle has moderately decreased function. The left ventricle demonstrates global hypokinesis. The left ventricular internal cavity size was moderately dilated. There is mild  left ventricular hypertrophy. Left ventricular diastolic parameters are  indeterminate. There is the interventricular septum is flattened in systole and diastole, consistent with right ventricular pressure and volume overload.  2. Right ventricular systolic function is severely reduced. The right ventricular size is moderately enlarged.  3. Left atrial size was severely dilated.  4. Right atrial size was severely dilated.  5. The mitral valve is rheumatic. Moderate mitral valve regurgitation. Mild mitral stenosis. Moderate mitral annular calcification.  6. Tricuspid valve regurgitation is moderate to severe.  7. The aortic valve is tricuspid. Aortic valve regurgitation is severe. Aortic valve sclerosis/calcification is present, without any evidence of aortic stenosis.  8. The inferior vena cava is dilated in size with <50% respiratory variability, suggesting right atrial pressure of 15 mmHg. FINDINGS  Left Ventricle: Left ventricular ejection fraction, by estimation, is 35 to 40%. The left ventricle has moderately decreased function. The left ventricle demonstrates global hypokinesis. The left ventricular internal cavity size was moderately dilated. There is mild left ventricular hypertrophy. The interventricular septum is flattened in systole and diastole, consistent with right ventricular pressure and volume overload. Left ventricular diastolic parameters are indeterminate. Right Ventricle: The right ventricular size is moderately enlarged. Right ventricular systolic function is severely reduced. Left Atrium: Left atrial size was severely dilated. Right Atrium: Right atrial size was severely dilated. Pericardium: Trivial pericardial effusion is present. Mitral Valve: The mitral valve is rheumatic. Moderate mitral annular calcification. Moderate mitral valve regurgitation. Mild mitral valve stenosis. MV peak gradient, 15.1 mmHg. The mean mitral valve gradient is 6.0 mmHg. Tricuspid Valve: The tricuspid valve is normal in structure. Tricuspid valve regurgitation is moderate to severe. No  evidence of tricuspid stenosis. Aortic Valve: The aortic valve is tricuspid. Aortic valve regurgitation is severe. Aortic valve sclerosis/calcification is present, without any evidence of aortic stenosis. Pulmonic Valve: The pulmonic valve was normal in structure. Pulmonic valve regurgitation is trivial. No evidence of pulmonic stenosis. Aorta: The aortic root is normal in size and structure. Venous: The inferior vena cava is dilated in size with  less than 50% respiratory variability, suggesting right atrial pressure of 15 mmHg. IAS/Shunts: There is left bowing of the interatrial septum, suggestive of elevated right atrial pressure. No atrial level shunt detected by color flow Doppler.  LEFT VENTRICLE PLAX 2D LVIDd:         6.00 cm LVIDs:         4.90 cm LV PW:         1.20 cm LV IVS:        1.30 cm LVOT diam:     1.90 cm LV SV:         62 LV SV Index:   45 LVOT Area:     2.84 cm  RIGHT VENTRICLE            IVC RV Basal diam:  5.75 cm    IVC diam: 2.30 cm RV Mid diam:    5.20 cm RV S prime:     8.31 cm/s TAPSE (M-mode): 1.3 cm LEFT ATRIUM           Index        RIGHT ATRIUM           Index LA diam:      6.20 cm 4.49 cm/m   RA Area:     31.80 cm LA Vol (A4C): 68.6 ml 49.72 ml/m  RA Volume:   118.00 ml 85.53 ml/m  AORTIC VALVE LVOT Vmax:   145.00 cm/s LVOT Vmean:  99.200 cm/s LVOT VTI:    0.217 m  AORTA Ao Root diam: 2.80 cm Ao Asc diam:  2.90 cm MITRAL VALVE MV Area VTI:  1.60 cm    SHUNTS MV Peak grad: 15.1 mmHg   Systemic VTI:  0.22 m MV Mean grad: 6.0 mmHg    Systemic Diam: 1.90 cm MV Vmax:      1.94 m/s MV Vmean:     110.5 cm/s Alexandria Angel MD Electronically signed by Alexandria Angel MD Signature Date/Time: 05/19/2024/11:00:52 AM    Final    DG Chest 1 View Result Date: 05/18/2024 CLINICAL DATA:  Shortness of breath EXAM: CHEST  1 VIEW COMPARISON:  05/08/2024 FINDINGS: Cardiomegaly with central congestion and mild diffuse interstitial opacity suggestive of edema, slightly improved compared with  05/08/2024. Aortic atherosclerosis. No pneumothorax IMPRESSION: Cardiomegaly with central congestion and mild diffuse interstitial opacity suggestive of edema, slightly improved compared with 05/08/2024. Electronically Signed   By: Esmeralda Hedge M.D.   On: 05/18/2024 22:58        Scheduled Meds:  amiodarone   200 mg Oral Daily   Chlorhexidine  Gluconate Cloth  6 each Topical Q0600   Chlorhexidine  Gluconate Cloth  6 each Topical Q0600   cinacalcet   30 mg Oral QPM   doxercalciferol   2 mcg Intravenous Q T,Th,Sa-HD   heparin   5,000 Units Subcutaneous Q8H   midodrine   10 mg Oral TID WC   sevelamer  carbonate  3,200 mg Oral TID WC   And   sevelamer  carbonate  1,600 mg Oral With snacks   sodium chloride  flush  3 mL Intravenous Q12H   sodium zirconium cyclosilicate   10 g Oral TID   Continuous Infusions:     LOS: 1 day    Time spent: 55 Minutes.    Fonnie Iba, MD  [facility] Pager #: [phone] 7PM-7AM contact night coverage as above

## 2024-05-20 NOTE — Plan of Care (Signed)
   Problem: Coping: Goal: Ability to adjust to condition or change in health will improve Outcome: Progressing   Problem: Fluid Volume: Goal: Ability to maintain a balanced intake and output will improve Outcome: Progressing   Problem: Metabolic: Goal: Ability to maintain appropriate glucose levels will improve Outcome: Progressing   Problem: Nutritional: Goal: Maintenance of adequate nutrition will improve Outcome: Progressing

## 2024-05-20 NOTE — Plan of Care (Signed)
  Problem: Education: Goal: Ability to describe self-care measures that may prevent or decrease complications (Diabetes Survival Skills Education) will improve Outcome: Progressing   Problem: Coping: Goal: Ability to adjust to condition or change in health will improve Outcome: Progressing   Problem: Health Behavior/Discharge Planning: Goal: Ability to identify and utilize available resources and services will improve Outcome: Progressing   Problem: Health Behavior/Discharge Planning: Goal: Ability to manage health-related needs will improve Outcome: Progressing   

## 2024-05-20 NOTE — Progress Notes (Addendum)
 [facility] Progress Note   Subjective:    Seen and examined patient at bedside. Tolerated yesterday's HD with net UF 3L. She is now on RA. Plan for HD again today.  Objective Vitals:   05/19/24 2034 05/20/24 0127 05/20/24 0535 05/20/24 0743  BP: (!) 108/51 (!) 96/55 (!) 108/56 (!) 106/52  Pulse: (!) 111 (!) 105 100 96  Resp: 18 20 20 20   Temp: 97.6 F (36.4 C) 97.6 F (36.4 C) 97.7 F (36.5 C) 98 F (36.7 C)  TempSrc: Oral Oral Oral Oral  SpO2: 90% 94%  93%  Weight:   38.5 kg   Height:       Physical Exam General: Awake, alert, NAD, now on RA Head: Sclera not icteric  Lungs: Diminished bilateral lower lobes, clear in uppers. No wheeze, rales or rhonchi. Breathing is unlabored. Heart: RRR. No murmur, rubs or gallops.  Abdomen: soft and non-tender Lower extremities: No b/l edema Neuro: AAOx3. Moves all extremities spontaneously. Dialysis Access: R AVF (+) B/T  Filed Weights   05/18/24 2207 05/19/24 1443 05/20/24 0535  Weight: 41 kg 41 kg 38.5 kg    Intake/Output Summary (Last 24 hours) at 05/20/2024 1130 Last data filed at 05/19/2024 2109 Gross per 24 hour  Intake 118 ml  Output 3000 ml  Net -2882 ml    Additional Objective Labs: Basic Metabolic Panel: Recent Labs  Lab 05/18/24 2358 05/19/24 0909 05/20/24 0049  NA 134* 131* 134*  K 5.9* 5.1 4.0  CL 91* 89* 92*  CO2 15* 19* 24  GLUCOSE 116* 326* 83  BUN 87* 87* 49*  CREATININE 7.45* 7.29* 4.46*  CALCIUM  7.3* 7.0* 7.6*  PHOS  --  7.3* 4.0   Liver Function Tests: Recent Labs  Lab 05/20/24 0049  ALBUMIN  3.1*   No results for input(s): "LIPASE", "AMYLASE" in the last 168 hours. CBC: Recent Labs  Lab 05/18/24 2210 05/18/24 2216 05/19/24 0618 05/20/24 0049  WBC 5.7  --  5.2 6.3  NEUTROABS  --   --   --  4.5  HGB 13.5 15.6*  15.3* 10.8* 12.6  HCT 43.3 46.0  45.0 33.7* 38.2  MCV 91.5  --  89.4 87.6  PLT 234  --  204 257   Blood Culture    Component Value Date/Time   SDES  BLOOD LEFT ANTECUBITAL 01/23/2024 1309   SPECREQUEST  01/23/2024 1309    BOTTLES DRAWN AEROBIC AND ANAEROBIC Blood Culture results may not be optimal due to an inadequate volume of blood received in culture bottles   CULT  01/23/2024 1309    NO GROWTH 5 DAYS Performed at [facility], [address], [place], [place] [address]    REPTSTATUS 01/28/2024 FINAL 01/23/2024 1309    Cardiac Enzymes: No results for input(s): "CKTOTAL", "CKMB", "CKMBINDEX", "TROPONINI" in the last 168 hours. CBG: Recent Labs  Lab 05/19/24 0019 05/19/24 0740 05/19/24 1309 05/19/24 2054 05/20/24 0620  GLUCAP 149* 97 71 91 100*   Iron  Studies: No results for input(s): "IRON ", "TIBC", "TRANSFERRIN", "FERRITIN" in the last 72 hours. Lab Results  Component Value Date   INR 1.4 (H) 12/16/2023   INR 1.5 (H) 12/13/2023   INR 1.3 (H) 04/12/2022   Studies/Results: ECHOCARDIOGRAM COMPLETE Result Date: 05/19/2024    ECHOCARDIOGRAM REPORT   Patient Name:   [name] Agent Date of Exam: 05/19/2024 Medical Rec #:  [id number]      Height:       63.0 in Accession #:  [id number]     Weight:       90.4 lb Date of Birth:  1981/10/04     BSA:          1.380 m Patient Age:    43 years       BP:           96/55 mmHg Patient Gender: F              HR:           98 bpm. Exam Location:  Inpatient Procedure: 2D Echo, Cardiac Doppler and Color Doppler (Both Spectral and Color            Flow Doppler were utilized during procedure). Indications:    Congestive Heart Failure I50.9  History:        Patient has prior history of Echocardiogram examinations, most                 recent 12/14/2023. CHF, Mitral Valve Disease, Arrythmias:Atrial                 Fibrillation; Risk Factors:Hypertension.  Sonographer:    Kip Peon RDCS Referring Phys: [id number] JONATHAN SEGARS IMPRESSIONS  1. Left ventricular ejection fraction, by estimation, is 35 to 40%. The left ventricle has moderately decreased function. The left ventricle demonstrates  global hypokinesis. The left ventricular internal cavity size was moderately dilated. There is mild  left ventricular hypertrophy. Left ventricular diastolic parameters are indeterminate. There is the interventricular septum is flattened in systole and diastole, consistent with right ventricular pressure and volume overload.  2. Right ventricular systolic function is severely reduced. The right ventricular size is moderately enlarged.  3. Left atrial size was severely dilated.  4. Right atrial size was severely dilated.  5. The mitral valve is rheumatic. Moderate mitral valve regurgitation. Mild mitral stenosis. Moderate mitral annular calcification.  6. Tricuspid valve regurgitation is moderate to severe.  7. The aortic valve is tricuspid. Aortic valve regurgitation is severe. Aortic valve sclerosis/calcification is present, without any evidence of aortic stenosis.  8. The inferior vena cava is dilated in size with <50% respiratory variability, suggesting right atrial pressure of 15 mmHg. FINDINGS  Left Ventricle: Left ventricular ejection fraction, by estimation, is 35 to 40%. The left ventricle has moderately decreased function. The left ventricle demonstrates global hypokinesis. The left ventricular internal cavity size was moderately dilated. There is mild left ventricular hypertrophy. The interventricular septum is flattened in systole and diastole, consistent with right ventricular pressure and volume overload. Left ventricular diastolic parameters are indeterminate. Right Ventricle: The right ventricular size is moderately enlarged. Right ventricular systolic function is severely reduced. Left Atrium: Left atrial size was severely dilated. Right Atrium: Right atrial size was severely dilated. Pericardium: Trivial pericardial effusion is present. Mitral Valve: The mitral valve is rheumatic. Moderate mitral annular calcification. Moderate mitral valve regurgitation. Mild mitral valve stenosis. MV peak  gradient, 15.1 mmHg. The mean mitral valve gradient is 6.0 mmHg. Tricuspid Valve: The tricuspid valve is normal in structure. Tricuspid valve regurgitation is moderate to severe. No evidence of tricuspid stenosis. Aortic Valve: The aortic valve is tricuspid. Aortic valve regurgitation is severe. Aortic valve sclerosis/calcification is present, without any evidence of aortic stenosis. Pulmonic Valve: The pulmonic valve was normal in structure. Pulmonic valve regurgitation is trivial. No evidence of pulmonic stenosis. Aorta: The aortic root is normal in size and structure. Venous: The inferior vena cava is dilated in size with less than 50% respiratory variability, suggesting right  atrial pressure of 15 mmHg. IAS/Shunts: There is left bowing of the interatrial septum, suggestive of elevated right atrial pressure. No atrial level shunt detected by color flow Doppler.  LEFT VENTRICLE PLAX 2D LVIDd:         6.00 cm LVIDs:         4.90 cm LV PW:         1.20 cm LV IVS:        1.30 cm LVOT diam:     1.90 cm LV SV:         62 LV SV Index:   45 LVOT Area:     2.84 cm  RIGHT VENTRICLE            IVC RV Basal diam:  5.75 cm    IVC diam: 2.30 cm RV Mid diam:    5.20 cm RV S prime:     8.31 cm/s TAPSE (M-mode): 1.3 cm LEFT ATRIUM           Index        RIGHT ATRIUM           Index LA diam:      6.20 cm 4.49 cm/m   RA Area:     31.80 cm LA Vol (A4C): 68.6 ml 49.72 ml/m  RA Volume:   118.00 ml 85.53 ml/m  AORTIC VALVE LVOT Vmax:   145.00 cm/s LVOT Vmean:  99.200 cm/s LVOT VTI:    0.217 m  AORTA Ao Root diam: 2.80 cm Ao Asc diam:  2.90 cm MITRAL VALVE MV Area VTI:  1.60 cm    SHUNTS MV Peak grad: 15.1 mmHg   Systemic VTI:  0.22 m MV Mean grad: 6.0 mmHg    Systemic Diam: 1.90 cm MV Vmax:      1.94 m/s MV Vmean:     110.5 cm/s Alexandria Angel MD Electronically signed by Alexandria Angel MD Signature Date/Time: 05/19/2024/11:00:52 AM    Final    DG Chest 1 View Result Date: 05/18/2024 CLINICAL DATA:  Shortness of breath EXAM:  CHEST  1 VIEW COMPARISON:  05/08/2024 FINDINGS: Cardiomegaly with central congestion and mild diffuse interstitial opacity suggestive of edema, slightly improved compared with 05/08/2024. Aortic atherosclerosis. No pneumothorax IMPRESSION: Cardiomegaly with central congestion and mild diffuse interstitial opacity suggestive of edema, slightly improved compared with 05/08/2024. Electronically Signed   By: Esmeralda Hedge M.D.   On: 05/18/2024 22:58    Medications:   amiodarone   200 mg Oral Daily   Chlorhexidine  Gluconate Cloth  6 each Topical Q0600   Chlorhexidine  Gluconate Cloth  6 each Topical Q0600   cinacalcet   30 mg Oral QPM   doxercalciferol   2 mcg Intravenous Q T,Th,Sa-HD   heparin   5,000 Units Subcutaneous Q8H   midodrine   10 mg Oral TID WC   sevelamer  carbonate  3,200 mg Oral TID WC   And   sevelamer  carbonate  1,600 mg Oral With snacks   sodium chloride  flush  3 mL Intravenous Q12H   sodium zirconium cyclosilicate   10 g Oral TID    Dialysis Orders: TTS - [facility] 3hrs42min, BFR 400, DFR Auto 1.5,  EDW 37.5kg (just lowered), 2K/ 2Ca Heparin  None-noted hx subdural hematoma Mircera 30 mcg q4wks - last 05/02/24 Hectorol  1mcg IV qHD - last 05/16/24  Assessment/Plan: Acute hypoxic respiratory failure/volume overload - recently discharged for this issue. Missed HD 5/22. She received HD 5/23 and scheduled again for today. Now on RA. ESRD - on HD TTS. HD  5/23 and scheduled again for today per her usual schedule. K+ stable-now 4 after getting Lokelma . Chronic hypotension/volume - Bps are soft, on Midodrine  TID. CXR shows central congestion and mild diffuse interstitial opacity consistent with pulmonary edema. See #1. Repeat CXR after today's HD. Anemia of CKD - Hgb 10.8. Hold Fe/ESA for now but will restart if needed Secondary Hyperparathyroidism -  Sensipar  and Renvela  resumed.  Nutrition - Renal diet with fluid restriction GOC - It was noted of our team discussing  with her in outpatient about her overall health decline. I discussed this with her again today and she reports wanting to continue with dialysis. She states: "stopping dialysis is not an option and stopping dialysis is like killing herself".    Jadene Maxwell, NP [facility] 05/20/2024,11:30 AM  LOS: 1 day     Seen and examined independently.  Agree with note and exam as documented above by physician extender and as noted here - note that as of the time of my exam she is on 4.5 liters oxygen.  General adult female in bed in no acute distress HEENT normocephalic atraumatic extraocular movements intact sclera anicteric Neck supple trachea midline Lungs clear to auscultation bilaterally normal work of breathing at rest and increased with exertion; she is on 4.5 liters oxygen Heart S1S2 no rub Abdomen soft nontender nondistended Extremities no pitting edema  Psych normal mood and affect Neuro alert and oriented x 3 provides hx and follows commands  Access RUE AVF with bruit and thrill    # Acute hypoxic respiratory failure - fluid overload - optimize volume status with HD - albumin  to augment UF (as well as midodrine )   # ESRD on HD - HD today per TTS schedule    # Chronic hypotension  - on midodrine  TID  - albumin  to augment UF   She states that she wants to continue with dialysis.    Nan Aver, MD 05/20/2024  1:13 PM

## 2024-05-21 DIAGNOSIS — E162 Hypoglycemia, unspecified: Secondary | ICD-10-CM

## 2024-05-21 LAB — RENAL FUNCTION PANEL
Albumin: 3 g/dL — ABNORMAL LOW (ref 3.5–5.0)
Anion gap: 16 — ABNORMAL HIGH (ref 5–15)
BUN: 38 mg/dL — ABNORMAL HIGH (ref 6–20)
CO2: 23 mmol/L (ref 22–32)
Calcium: 7.9 mg/dL — ABNORMAL LOW (ref 8.9–10.3)
Chloride: 95 mmol/L — ABNORMAL LOW (ref 98–111)
Creatinine, Ser: 3.83 mg/dL — ABNORMAL HIGH (ref 0.44–1.00)
GFR, Estimated: 15 mL/min — ABNORMAL LOW (ref >60)
Glucose, Bld: 101 mg/dL — ABNORMAL HIGH (ref 70–99)
Phosphorus: 2.7 mg/dL (ref 2.5–4.6)
Potassium: 4.4 mmol/L (ref 3.5–5.1)
Sodium: 134 mmol/L — ABNORMAL LOW (ref 135–145)

## 2024-05-21 LAB — GLUCOSE, CAPILLARY
Glucose-Capillary: 82 mg/dL (ref 70–99)
Glucose-Capillary: 65 mg/dL — ABNORMAL LOW (ref 70–99)
Glucose-Capillary: 46 mg/dL — ABNORMAL LOW (ref 70–99)
Glucose-Capillary: 67 mg/dL — ABNORMAL LOW (ref 70–99)
Glucose-Capillary: 309 mg/dL — ABNORMAL HIGH (ref 70–99)
Glucose-Capillary: 69 mg/dL — ABNORMAL LOW (ref 70–99)
Glucose-Capillary: 87 mg/dL (ref 70–99)
Glucose-Capillary: 82 mg/dL (ref 70–99)

## 2024-05-21 LAB — TSH: TSH: 5.807 u[IU]/mL — ABNORMAL HIGH (ref 0.350–4.500)

## 2024-05-21 LAB — T4, FREE: Free T4: 0.91 ng/dL (ref 0.61–1.12)

## 2024-05-21 LAB — CORTISOL: Cortisol, Plasma: 11.2 ug/dL

## 2024-05-21 MED ORDER — DOXERCALCIFEROL 4 MCG/2ML IV SOLN: 1.0000 ug | INTRAVENOUS | Status: DC

## 2024-05-21 MED ORDER — DEXTROSE 50 % IV SOLN
25.0000 g | INTRAVENOUS | Status: AC
  Administered 2024-05-21: 25 g via INTRAVENOUS
  Filled 2024-05-21: qty 50

## 2024-05-21 NOTE — Progress Notes (Signed)
 PROGRESS NOTE    [name]  ZOX:[id number] DOB: 1981/03/28 DOA: 05/18/2024 PCP: Senaida Dama, NP  Outpatient Specialists:     Brief Narrative:  43 y.o. female with hx ESRD on TTS HD, SLE with lupus nephritis, heart failure with reduced ejection fraction, a flutter not on anticoagulation due to prior subdural hematoma, aortic regurgitation, mitral stenosis, prior history of polysubstance use.  Admitted earlier today with Chronic SOB, pulmonary edema, worsening pain in hands and feet (possible lupus flare), hyperkalemia, hypocalcemia, hyperphosphatemia, acidosis and severe hyperglycemia.  Patient also missed hemodialysis.  05/19/2024: Patient seen.  ECHO is in progress.  No significant history from the patient. ? Parotid swelling.  Low threshold to consult the Palliative care team.  Guarded prognosis.  05/20/2024: Patient seen.  Patient is more communicative today.  Patient is awaiting repeat hemodialysis.  No hand pain today.  05/21/2024: Patient seen alongside patient's nurses.  Low blood sugars noted, with blood sugar of 46.  Will start calorie count, check cortisol and consult dietary team.  Patient is cachectic.  Will check free T4 and TSH as well.  Possible discharge tomorrow  Assessment & Plan:   Active Problems:   ESRD on dialysis (HCC)   Acute on chronic systolic CHF (congestive heart failure) (HCC)   Atrial fibrillation with RVR (HCC)   Acute hypoxemic respiratory failure (HCC)   Volume overload   Hypoglycemia   Dyspnea   History ESRD with missed HD session, on TTS HD Acute hypoxic respiratory failure secondary to pulmonary edema, volume overload Hyperkalemia, improving  AGMA  Elevated BUN without clinical uremia  Missed HD session due to symptoms of pain, diarrhea.  On initial evaluation by EMS hypoxic requiring 6 L of O2. Chest x-ray with pulmonary edema. K 6.4 -> 5.9, + EKG changes with peaked T waves. Bicarb 15, AG 28. BUN 82.  -EDP consulted nephrology Dr.  Britta Candy who will try to arrange urgent dialysis but thinks most likely will be in the morning.   -Anuric, volume management by HD  -Continue Lokelma  10 g p.o. 3 times daily tentatively ordered for 2 days, continue as needed -Check lactate with AGMA  -Continue home midodrine  10 mg 3 times daily for blood pressure support -Continue home Sevelamer , Cinacalcet  -Renal diet 05/19/2024: Awaiting HD 05/20/2024: For repeat dialysis today.  Hyperkalemia has resolved with hemodialysis.  Potassium of 4 today.  CO2 of 24. 05/21/2024: Patient underwent hemodialysis very early this morning.  Likely discharge back home tomorrow.  Resume regular outpatient hemodialysis on discharge.     Acute exacerbation heart failure with reduced ejection fraction, BiV failure  Moderate to severe aortic regurgitation Mild mitral stenosis Last TTE 12/' 24 LVEF 30 to 35%, global hypokinesis, moderately reduced RV systolic function, mild MS, moderate to severe AI.  BNP greater than 4500.  High-sensitivity troponin elevated per below.  Etiology of heart failure exacerbation suspect mainly secondary to volume overload from missed dialysis, may have component of chronic myocardial injury and tachyarrhythmia contributing as well. -Volume management per HD -Repeat TTE -Intolerant of GDMT due to hypotension 05/19/2024:  Low threshold to consult the cardiology team. 05/21/2024: Compensated.   Atrial fibrillation with rapid ventricular response, Initial EKG A-fib rate 128; rates in the ED mainly ~low 100s.  Suspect trigger is volume overload - Continue home amiodarone  200 mg daily.  Rate control acceptable for now, follow response with HD, if worsening rates would start on Amio bolus and drip -Not on anticoagulation due to prior history of subdural hematoma  05/21/2024: Heart rate has improved significantly.   Severe range hypoglycemia Question possible pseudohypoglycemia considering she was asymptomatic despite markedly severe  hypoglycemia with nadir less than 10.  Not on diabetic medications.  Etiology if this was true hypoglycemia is unclear. - Continue D10 drip reduced rate to 25 cc an hour considering her ESRD and volume overload. - Check cosyntropin  stim test in the morning. - If she is to have another episode of hypoglycemia per fingerstick would draw a stat serum glucose for comparison and then treat her hypoglycemia protocol 05/19/2024: Continue to monitor closely. 05/20/2024: Stable.  No hypoglycemia.  Patient reports having good appetite. 05/21/2024: Blood sugar of 46 noted today.  Will proceed with calorie count.  Check cortisol level.   Chronic myocardial injury:  Chronic elevation in HS troponin currently flat at 153 which is similar to prior. Demand in the setting of her volume overload, hypoxia, arrhythmia.  -Management directed at these conditions.   Arthralgias in hands and feet  Question ischemic pain v lupus flare Acute onset of pain without evidence of significant synovitis on exam.  Does have distal digital ulceration especially in the right hand.  Considering her respiratory failure and hypoxia, chronic hypotension question if may have been having ischemic pain in her distal limbs rather than true lupus flare. -Check ESR, DS DNA ab  -If she has evidence of ongoing inflammation and pain does not improve with management of her volume status, correction of her hypoxia then will consider initiation of prednisone  and follow-up with rheumatology as outpatient -Symptomatic management Tylenol  as needed mild, methocarbamol  as needed spasms, can use oxycodone  as needed (currently held off on a standing prn order)  05/19/2024: Follow ESR and double-stranded DNA antibody.  Low threshold to start patient on prednisone .   Chronic medical problems: History of polysubstance use: Check serum tox   Body mass index is 16.01 kg/m.  Moderate protein calorie malnutrition   DVT prophylaxis: [place] Heparin  Code Status:  Full code.  Family Communication:  Disposition Plan: Inpatient   Consultants:  Nephrology  Procedures:  ECHO (Follow result)  Antimicrobials:  None   Subjective: No complaints. Low blood sugar noted today.  Objective: Vitals:   05/21/24 0928 05/21/24 1105 05/21/24 1433 05/21/24 1605  BP:  (!) 99/55  (!) 100/55  Pulse:  (!) 112  75  Resp:  18  19  Temp:  97.6 F (36.4 C)  98.3 F (36.8 C)  TempSrc:  Oral  Oral  SpO2:  98% 96% 92%  Weight: 39 kg     Height:        Intake/Output Summary (Last 24 hours) at 05/21/2024 1634 Last data filed at 05/21/2024 1324 Gross per 24 hour  Intake 737 ml  Output 1600 ml  Net -863 ml   Filed Weights   05/20/24 0535 05/21/24 0443 05/21/24 0928  Weight: 38.5 kg 42 kg 39 kg    Examination:  General exam: Cachectic. No history  Respiratory system: Clear to auscultation.   Cardiovascular system: S1 & S2 heard Gastrointestinal system: Soft and nontender. Central nervous system: Awake and alert.  Moves all extremities. Extremities: No leg edema.  Data Reviewed: I have personally reviewed following labs and imaging studies  CBC: Recent Labs  Lab 05/18/24 2210 05/18/24 2216 05/19/24 0618 05/20/24 0049  WBC 5.7  --  5.2 6.3  NEUTROABS  --   --   --  4.5  HGB 13.5 15.6*  15.3* 10.8* 12.6  HCT 43.3 46.0  45.0 33.7* 38.2  MCV 91.5  --  89.4 87.6  PLT 234  --  204 257   Basic Metabolic Panel: Recent Labs  Lab 05/18/24 2216 05/18/24 2358 05/19/24 0909 05/20/24 0049 05/21/24 1205  NA 134*  134* 134* 131* 134* 134*  K 6.4*  6.3* 5.9* 5.1 4.0 4.4  CL 100 91* 89* 92* 95*  CO2  --  15* 19* 24 23  GLUCOSE 26* 116* 326* 83 101*  BUN 82* 87* 87* 49* 38*  CREATININE 7.70* 7.45* 7.29* 4.46* 3.83*  CALCIUM   --  7.3* 7.0* 7.6* 7.9*  MG  --   --  2.4 2.3  --   PHOS  --   --  7.3* 4.0 2.7   GFR: Estimated Creatinine Clearance: 12 mL/min (A) (by C-G formula based on SCr of 3.83 mg/dL (H)). Liver Function Tests: Recent Labs   Lab 05/20/24 0049 05/21/24 1205  ALBUMIN  3.1* 3.0*   No results for input(s): "LIPASE", "AMYLASE" in the last 168 hours. No results for input(s): "AMMONIA" in the last 168 hours. Coagulation Profile: No results for input(s): "INR", "PROTIME" in the last 168 hours. Cardiac Enzymes: No results for input(s): "CKTOTAL", "CKMB", "CKMBINDEX", "TROPONINI" in the last 168 hours. BNP (last 3 results) No results for input(s): "PROBNP" in the last 8760 hours. HbA1C: No results for input(s): "HGBA1C" in the last 72 hours. CBG: Recent Labs  Lab 05/21/24 1132 05/21/24 1158 05/21/24 1254 05/21/24 1602 05/21/24 1615  GLUCAP 67* 46* 309* 69* 87   Lipid Profile: No results for input(s): "CHOL", "HDL", "LDLCALC", "TRIG", "CHOLHDL", "LDLDIRECT" in the last 72 hours. Thyroid Function Tests: Recent Labs    05/21/24 1456  TSH 5.807*  FREET4 0.91   Anemia Panel: No results for input(s): "VITAMINB12", "FOLATE", "FERRITIN", "TIBC", "IRON ", "RETICCTPCT" in the last 72 hours. Urine analysis:    Component Value Date/Time   COLORURINE YELLOW 09/25/2015 1412   APPEARANCEUR CLOUDY (A) 09/25/2015 1412   LABSPEC 1.018 09/25/2015 1412   PHURINE 8.0 09/25/2015 1412   GLUCOSEU NEGATIVE 09/25/2015 1412   HGBUR LARGE (A) 09/25/2015 1412   BILIRUBINUR NEGATIVE 09/25/2015 1412   KETONESUR NEGATIVE 09/25/2015 1412   PROTEINUR >300 (A) 09/25/2015 1412   UROBILINOGEN 0.2 09/25/2015 1412   NITRITE NEGATIVE 09/25/2015 1412   LEUKOCYTESUR MODERATE (A) 09/25/2015 1412   Sepsis Labs: @LABRCNTIP (procalcitonin:4,lacticidven:4)  ) Recent Results (from the past 240 hours)  Resp panel by RT-PCR (RSV, Flu A&B, Covid) Anterior Nasal Swab     Status: None   Collection Time: 05/19/24 12:28 AM   Specimen: Anterior Nasal Swab  Result Value Ref Range Status   SARS Coronavirus 2 by RT PCR NEGATIVE NEGATIVE Final   Influenza A by PCR NEGATIVE NEGATIVE Final   Influenza B by PCR NEGATIVE NEGATIVE Final    Comment:  (NOTE) The Xpert Xpress SARS-CoV-2/FLU/RSV plus assay is intended as an aid in the diagnosis of influenza from Nasopharyngeal swab specimens and should not be used as a sole basis for treatment. Nasal washings and aspirates are unacceptable for Xpert Xpress SARS-CoV-2/FLU/RSV testing.  Fact Sheet for Patients: BloggerCourse.com  Fact Sheet for Healthcare Providers: SeriousBroker.it  This test is not yet approved or cleared by the [place]  FDA and has been authorized for detection and/or diagnosis of SARS-CoV-2 by FDA under an Emergency Use Authorization (EUA). This EUA will remain in effect (meaning this test can be used) for the duration of the COVID-19 declaration under Section 564(b)(1) of the Act, 21 U.S.C. section 360bbb-3(b)(1), unless the authorization is terminated  or revoked.     Resp Syncytial Virus by PCR NEGATIVE NEGATIVE Final    Comment: (NOTE) Fact Sheet for Patients: BloggerCourse.com  Fact Sheet for Healthcare Providers: SeriousBroker.it  This test is not yet approved or cleared by the [place]  FDA and has been authorized for detection and/or diagnosis of SARS-CoV-2 by FDA under an Emergency Use Authorization (EUA). This EUA will remain in effect (meaning this test can be used) for the duration of the COVID-19 declaration under Section 564(b)(1) of the Act, 21 U.S.C. section 360bbb-3(b)(1), unless the authorization is terminated or revoked.  Performed at [facility], [address] [place], [place] [address]   Culture, blood (Routine X 2) w Reflex to ID Panel     Status: None (Preliminary result)   Collection Time: 05/20/24 12:49 AM   Specimen: BLOOD  Result Value Ref Range Status   Specimen Description BLOOD LEFT ANTECUBITAL  Final   Special Requests   Final    BOTTLES DRAWN AEROBIC AND ANAEROBIC Blood Culture results may not be optimal  due to an inadequate volume of blood received in culture bottles   Culture   Final    NO GROWTH 1 DAY Performed at [facility], [address] [place], [place] [address]    Report Status PENDING  Incomplete  Culture, blood (Routine X 2) w Reflex to ID Panel     Status: None (Preliminary result)   Collection Time: 05/20/24 12:49 AM   Specimen: BLOOD LEFT HAND  Result Value Ref Range Status   Specimen Description BLOOD LEFT HAND  Final   Special Requests   Final    BOTTLES DRAWN AEROBIC AND ANAEROBIC Blood Culture results may not be optimal due to an inadequate volume of blood received in culture bottles   Culture   Final    NO GROWTH 1 DAY Performed at [facility], [address] [place], [place] [address]    Report Status PENDING  Incomplete         Radiology Studies: No results found.       Scheduled Meds:  amiodarone   200 mg Oral Daily   Chlorhexidine  Gluconate Cloth  6 each Topical Q0600   Chlorhexidine  Gluconate Cloth  6 each Topical Q0600   [START ON 05/23/2024] doxercalciferol   1 mcg Intravenous Q T,Th,Sa-HD   heparin   5,000 Units Subcutaneous Q8H   midodrine   10 mg Oral TID WC   sevelamer  carbonate  3,200 mg Oral TID WC   And   sevelamer  carbonate  1,600 mg Oral With snacks   sodium chloride  flush  3 mL Intravenous Q12H   Continuous Infusions:     LOS: 2 days    Time spent: 35 Minutes.    Fonnie Iba, MD  [facility] Pager #: [phone] 7PM-7AM contact night coverage as above

## 2024-05-21 NOTE — Progress Notes (Signed)
 Hypoglycemic Event  CBG: 65  Treatment: 4 oz juice/soda  Symptoms: Fatigue  Follow-up CBG: Time:1130 CBG Result:67  Possible Reasons for Event: Inadequate meal intake and Other: Dialysis  Comments/MD notified: Hypoglycemia protocol continued. Patient not drinking 4 oz juice well. Recheck after 15 minutes was CBG 67. Patient encouraged to drink another 4 oz juice.

## 2024-05-21 NOTE — Plan of Care (Signed)
   Problem: Clinical Measurements: Goal: Respiratory complications will improve Outcome: Progressing   Problem: Safety: Goal: Ability to remain free from injury will improve Outcome: Progressing

## 2024-05-21 NOTE — Progress Notes (Signed)
 Hypoglycemic Event  CBG: 46  Treatment: D50 50 mL (25 gm)  Symptoms: None  Follow-up CBG: Time:1254 CBG Result:309  Possible Reasons for Event: Inadequate meal intake  Comments/MD notified:  1158- Patient not agreeable to increasing PO intake at this this time. Patient educated of glycemic control.   1254- After D50 given, patient had changed mind about meal intake and consumed the entirety of her lunch, further increasing CBG reading.   [name]

## 2024-05-21 NOTE — Progress Notes (Addendum)
 [facility] Progress Note   Subjective:    Seen and examined patient at bedside. She reports her blood sugar was low and snacks were provided to her. Tolerated HD overnight with net UF 1.6L. She is now on RA. Next HD 5/27 if she remains inpatient reached out to Hospitalist to confirm discharge plans.  Objective Vitals:   05/21/24 0443 05/21/24 0737 05/21/24 0928 05/21/24 1105  BP:  (!) 91/59  (!) 99/55  Pulse:  (!) 110  (!) 112  Resp:  18  18  Temp:  97.8 F (36.6 C)  97.6 F (36.4 C)  TempSrc:  Oral  Oral  SpO2:  100%  98%  Weight: 42 kg  39 kg   Height:       Physical Exam General: Awake, alert, NAD, now on RA Lungs: Diminished bilateral lower lobes, clear in uppers. No wheeze, rales or rhonchi. Breathing is unlabored. Heart: RRR. No murmur, rubs or gallops.  Abdomen: soft and non-tender Lower extremities: No b/l edema Neuro: AAOx3. Moves all extremities spontaneously. Dialysis Access: R AVF (+) B/T   Filed Weights   05/20/24 0535 05/21/24 0443 05/21/24 0928  Weight: 38.5 kg 42 kg 39 kg    Intake/Output Summary (Last 24 hours) at 05/21/2024 1145 Last data filed at 05/21/2024 0900 Gross per 24 hour  Intake 800 ml  Output 1600 ml  Net -800 ml    Additional Objective Labs: Basic Metabolic Panel: Recent Labs  Lab 05/18/24 2358 05/19/24 0909 05/20/24 0049  NA 134* 131* 134*  K 5.9* 5.1 4.0  CL 91* 89* 92*  CO2 15* 19* 24  GLUCOSE 116* 326* 83  BUN 87* 87* 49*  CREATININE 7.45* 7.29* 4.46*  CALCIUM  7.3* 7.0* 7.6*  PHOS  --  7.3* 4.0   Liver Function Tests: Recent Labs  Lab 05/20/24 0049  ALBUMIN  3.1*   No results for input(s): "LIPASE", "AMYLASE" in the last 168 hours. CBC: Recent Labs  Lab 05/18/24 2210 05/18/24 2216 05/19/24 0618 05/20/24 0049  WBC 5.7  --  5.2 6.3  NEUTROABS  --   --   --  4.5  HGB 13.5 15.6*  15.3* 10.8* 12.6  HCT 43.3 46.0  45.0 33.7* 38.2  MCV 91.5  --  89.4 87.6  PLT 234  --  204 257   Blood  Culture    Component Value Date/Time   SDES BLOOD LEFT ANTECUBITAL 05/20/2024 0049   SDES BLOOD LEFT HAND 05/20/2024 0049   SPECREQUEST  05/20/2024 0049    BOTTLES DRAWN AEROBIC AND ANAEROBIC Blood Culture results may not be optimal due to an inadequate volume of blood received in culture bottles   SPECREQUEST  05/20/2024 0049    BOTTLES DRAWN AEROBIC AND ANAEROBIC Blood Culture results may not be optimal due to an inadequate volume of blood received in culture bottles   CULT  05/20/2024 0049    NO GROWTH 1 DAY Performed at [facility], [address], [place], [place] [address]    CULT  05/20/2024 0049    NO GROWTH 1 DAY Performed at [facility], [address], [place], [place] [address]    REPTSTATUS PENDING 05/20/2024 0049   REPTSTATUS PENDING 05/20/2024 0049    Cardiac Enzymes: No results for input(s): "CKTOTAL", "CKMB", "CKMBINDEX", "TROPONINI" in the last 168 hours. CBG: Recent Labs  Lab 05/20/24 1622 05/20/24 2043 05/21/24 0439 05/21/24 1101 05/21/24 1132  GLUCAP 133* 95 82 65* 67*   Iron  Studies: No results  for input(s): "IRON ", "TIBC", "TRANSFERRIN", "FERRITIN" in the last 72 hours. Lab Results  Component Value Date   INR 1.4 (H) 12/16/2023   INR 1.5 (H) 12/13/2023   INR 1.3 (H) 04/12/2022   Studies/Results: No results found.  Medications:   amiodarone   200 mg Oral Daily   Chlorhexidine  Gluconate Cloth  6 each Topical Q0600   Chlorhexidine  Gluconate Cloth  6 each Topical Q0600   heparin   5,000 Units Subcutaneous Q8H   midodrine   10 mg Oral TID WC   sevelamer  carbonate  3,200 mg Oral TID WC   And   sevelamer  carbonate  1,600 mg Oral With snacks   sodium chloride  flush  3 mL Intravenous Q12H    Dialysis Orders: TTS - [facility] 3hrs104min, BFR 400, DFR Auto 1.5,  EDW 37.5kg (just lowered), 2K/ 2Ca Heparin  None-noted hx subdural hematoma Mircera 30 mcg q4wks - last 05/02/24 Hectorol  1mcg IV qHD - last  05/16/24  Assessment/Plan: Acute hypoxic respiratory failure/volume overload - recently discharged for this issue. Missed HD 5/22. She received HD 5/23 and again on 5/24. Next HD 5/27 if she remains inpatient. ESRD - on HD TTS. HD 5/23 and scheduled again for today per her usual schedule. K+ stable-now 4 after getting Lokelma . Patient has a wound left thigh but we do not feel this is calciphylaxis per outpatient notes from her primary Nephrologist. She is followed by wound care in outpatient.  Chronic hypotension/volume - Bps are soft, on Midodrine  TID. CXR shows central congestion and mild diffuse interstitial opacity consistent with pulmonary edema. See #1. Will repeat CXR in AM. Anemia of CKD - Hgb 12.6. Hold Fe/ESA for now but will restart if needed Secondary Hyperparathyroidism -  Hectorol  and Renvela  resumed. Corr Ca low so stopped Sensipar  for now. Nutrition - Renal diet with fluid restriction GOC - It was noted of our team discussing with her in outpatient about her overall health decline. I discussed this with her again today and she reports wanting to continue with dialysis. She states: "stopping dialysis is not an option and stopping dialysis is like killing herself".   Jadene Maxwell, NP [facility] 05/21/2024,11:45 AM  LOS: 2 days     Seen and examined independently.  Agree with note and exam as documented above by physician extender and as noted here.  States that her breathing is much improved.  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  Had 1.6 kg UF on last tx.  Ordered standing daily weights.   General adult female in bed in no acute distress HEENT normocephalic atraumatic extraocular movements intact sclera anicteric Neck supple trachea midline Lungs clear to auscultation bilaterally; reduced breath sounds right base; normal  work of breathing at rest; she is on room air Heart S1S2 no rub Abdomen soft nontender nondistended Extremities no pitting edema  Psych normal mood and affect Neuro alert and oriented x 3 provides hx and follows commands  Access RUE AVF with bruit and thrill    # Acute hypoxic respiratory failure - fluid overload - optimize volume status with HD   # ESRD on HD - HD today per TTS schedule  - we have discussed here and outpatient with the patient that she is not tolerating HD well.  She still does wish to continue with dialysis    # Chronic hypotension  - on  midodrine  TID  - albumin  to augment UF while here  # Hypocalcemia  - stop sensipar    Disposition per primary team   Nan Aver, MD 05/21/2024  1:16 PM

## 2024-05-21 NOTE — Progress Notes (Incomplete)
 PROGRESS NOTE    [name]  ZOX:[id number] DOB: [birth year] DOA: 05/18/2024 PCP: Senaida Dama, NP  Outpatient Specialists:     Brief Narrative:  43 y.o. female with hx ESRD on TTS HD, SLE with lupus nephritis, heart failure with reduced ejection fraction, a flutter not on anticoagulation due to prior subdural hematoma, aortic regurgitation, mitral stenosis, prior history of polysubstance use.  Admitted earlier today with Chronic SOB, pulmonary edema, worsening pain in hands and feet (possible lupus flare), hyperkalemia, hypocalcemia, hyperphosphatemia, acidosis and severe hyperglycemia.  Patient also missed hemodialysis.  05/19/2024: Patient seen.  ECHO is in progress.  No significant history from the patient. ? Parotid swelling.  Low threshold to consult the Palliative care team.  Guarded prognosis.  05/20/2024: Patient seen.  Patient is more communicative today.  Patient is awaiting repeat hemodialysis.  No hand pain today.  Assessment & Plan:   Active Problems:   ESRD on dialysis (HCC)   Acute on chronic systolic CHF (congestive heart failure) (HCC)   Atrial fibrillation with RVR (HCC)   Acute hypoxemic respiratory failure (HCC)   Volume overload   Hypoglycemia   Dyspnea   History ESRD with missed HD session, on TTS HD Acute hypoxic respiratory failure secondary to pulmonary edema, volume overload Hyperkalemia, improving  AGMA  Elevated BUN without clinical uremia  Missed HD session due to symptoms of pain, diarrhea.  On initial evaluation by EMS hypoxic requiring 6 L of O2. Chest x-ray with pulmonary edema. K 6.4 -> 5.9, + EKG changes with peaked T waves. Bicarb 15, AG 28. BUN 82.  -EDP consulted nephrology Dr. Britta Candy who will try to arrange urgent dialysis but thinks most likely will be in the morning.   -Anuric, volume management by HD  -Continue Lokelma  10 g p.o. 3 times daily tentatively ordered for 2 days, continue as needed -Check lactate with AGMA  -Continue  home midodrine  10 mg 3 times daily for blood pressure support -Continue home Sevelamer , Cinacalcet  -Renal diet 05/19/2024: Awaiting HD 05/20/2024: For repeat dialysis today.  Hyperkalemia has resolved with hemodialysis.  Potassium of 4 today.  CO2 of 24.   Acute exacerbation heart failure with reduced ejection fraction, BiV failure  Moderate to severe aortic regurgitation Mild mitral stenosis Last TTE 12/' 24 LVEF 30 to 35%, global hypokinesis, moderately reduced RV systolic function, mild MS, moderate to severe AI.  BNP greater than 4500.  High-sensitivity troponin elevated per below.  Etiology of heart failure exacerbation suspect mainly secondary to volume overload from missed dialysis, may have component of chronic myocardial injury and tachyarrhythmia contributing as well. -Volume management per HD -Repeat TTE -Intolerant of GDMT due to hypotension 05/19/2024:  Low threshold to consult the cardiology team. 05/20/2024: Compensated.   Atrial fibrillation with rapid ventricular response, Initial EKG A-fib rate 128; rates in the ED mainly ~low 100s.  Suspect trigger is volume overload - Continue home amiodarone  200 mg daily.  Rate control acceptable for now, follow response with HD, if worsening rates would start on Amio bolus and drip -Not on anticoagulation due to prior history of subdural hematoma 05/20/2024: Heart rate has improved significantly.   Severe range hypoglycemia Question possible pseudohypoglycemia considering she was asymptomatic despite markedly severe hypoglycemia with nadir less than 10.  Not on diabetic medications.  Etiology if this was true hypoglycemia is unclear. - Continue D10 drip reduced rate to 25 cc an hour considering her ESRD and volume overload. - Check cosyntropin  stim test in the morning. - If  she is to have another episode of hypoglycemia per fingerstick would draw a stat serum glucose for comparison and then treat her hypoglycemia protocol 05/19/2024:  Continue to monitor closely. 05/20/2024: Stable.  No hypoglycemia.  Patient reports having good appetite.   Chronic myocardial injury:  Chronic elevation in HS troponin currently flat at 153 which is similar to prior. Demand in the setting of her volume overload, hypoxia, arrhythmia.  -Management directed at these conditions.   Arthralgias in hands and feet  Question ischemic pain v lupus flare Acute onset of pain without evidence of significant synovitis on exam.  Does have distal digital ulceration especially in the right hand.  Considering her respiratory failure and hypoxia, chronic hypotension question if may have been having ischemic pain in her distal limbs rather than true lupus flare. -Check ESR, DS DNA ab  -If she has evidence of ongoing inflammation and pain does not improve with management of her volume status, correction of her hypoxia then will consider initiation of prednisone  and follow-up with rheumatology as outpatient -Symptomatic management Tylenol  as needed mild, methocarbamol  as needed spasms, can use oxycodone  as needed (currently held off on a standing prn order)  05/19/2024: Follow ESR and double-stranded DNA antibody.  Low threshold to start patient on prednisone .   Chronic medical problems: History of polysubstance use: Check serum tox   Body mass index is 16.01 kg/m.  Moderate protein calorie malnutrition   DVT prophylaxis: [place] Heparin  Code Status: Full code.  Family Communication:  Disposition Plan: Inpatient   Consultants:  Nephrology  Procedures:  ECHO (Follow result)  Antimicrobials:  None   Subjective: No complaints.  Objective: Vitals:   05/21/24 0443 05/21/24 0737 05/21/24 0928 05/21/24 1105  BP:  (!) 91/59  (!) 99/55  Pulse:  (!) 110  (!) 112  Resp:  18  18  Temp:  97.8 F (36.6 C)  97.6 F (36.4 C)  TempSrc:  Oral  Oral  SpO2:  100%  98%  Weight: 42 kg  39 kg   Height:        Intake/Output Summary (Last 24 hours) at 05/21/2024  1414 Last data filed at 05/21/2024 1324 Gross per 24 hour  Intake 737 ml  Output 1600 ml  Net -863 ml   Filed Weights   05/20/24 0535 05/21/24 0443 05/21/24 0928  Weight: 38.5 kg 42 kg 39 kg    Examination:  General exam: Cachectic. No history  Respiratory system: Clear to auscultation.   Cardiovascular system: S1 & S2 heard Gastrointestinal system: Soft and nontender. Central nervous system: Awake and alert.  Moves all extremities. Extremities: No leg edema.  Data Reviewed: I have personally reviewed following labs and imaging studies  CBC: Recent Labs  Lab 05/18/24 2210 05/18/24 2216 05/19/24 0618 05/20/24 0049  WBC 5.7  --  5.2 6.3  NEUTROABS  --   --   --  4.5  HGB 13.5 15.6*  15.3* 10.8* 12.6  HCT 43.3 46.0  45.0 33.7* 38.2  MCV 91.5  --  89.4 87.6  PLT 234  --  204 257   Basic Metabolic Panel: Recent Labs  Lab 05/18/24 2216 05/18/24 2358 05/19/24 0909 05/20/24 0049 05/21/24 1205  NA 134*  134* 134* 131* 134* 134*  K 6.4*  6.3* 5.9* 5.1 4.0 4.4  CL 100 91* 89* 92* 95*  CO2  --  15* 19* 24 23  GLUCOSE 26* 116* 326* 83 101*  BUN 82* 87* 87* 49* 38*  CREATININE 7.70* 7.45* 7.29*  4.46* 3.83*  CALCIUM   --  7.3* 7.0* 7.6* 7.9*  MG  --   --  2.4 2.3  --   PHOS  --   --  7.3* 4.0 2.7   GFR: Estimated Creatinine Clearance: 12 mL/min (A) (by C-G formula based on SCr of 3.83 mg/dL (H)). Liver Function Tests: Recent Labs  Lab 05/20/24 0049 05/21/24 1205  ALBUMIN  3.1* 3.0*   No results for input(s): "LIPASE", "AMYLASE" in the last 168 hours. No results for input(s): "AMMONIA" in the last 168 hours. Coagulation Profile: No results for input(s): "INR", "PROTIME" in the last 168 hours. Cardiac Enzymes: No results for input(s): "CKTOTAL", "CKMB", "CKMBINDEX", "TROPONINI" in the last 168 hours. BNP (last 3 results) No results for input(s): "PROBNP" in the last 8760 hours. HbA1C: No results for input(s): "HGBA1C" in the last 72 hours. CBG: Recent Labs   Lab 05/21/24 0439 05/21/24 1101 05/21/24 1132 05/21/24 1158 05/21/24 1254  GLUCAP 82 65* 67* 46* 309*   Lipid Profile: No results for input(s): "CHOL", "HDL", "LDLCALC", "TRIG", "CHOLHDL", "LDLDIRECT" in the last 72 hours. Thyroid Function Tests: No results for input(s): "TSH", "T4TOTAL", "FREET4", "T3FREE", "THYROIDAB" in the last 72 hours. Anemia Panel: No results for input(s): "VITAMINB12", "FOLATE", "FERRITIN", "TIBC", "IRON ", "RETICCTPCT" in the last 72 hours. Urine analysis:    Component Value Date/Time   COLORURINE YELLOW 09/25/2015 1412   APPEARANCEUR CLOUDY (A) 09/25/2015 1412   LABSPEC 1.018 09/25/2015 1412   PHURINE 8.0 09/25/2015 1412   GLUCOSEU NEGATIVE 09/25/2015 1412   HGBUR LARGE (A) 09/25/2015 1412   BILIRUBINUR NEGATIVE 09/25/2015 1412   KETONESUR NEGATIVE 09/25/2015 1412   PROTEINUR >300 (A) 09/25/2015 1412   UROBILINOGEN 0.2 09/25/2015 1412   NITRITE NEGATIVE 09/25/2015 1412   LEUKOCYTESUR MODERATE (A) 09/25/2015 1412   Sepsis Labs: @LABRCNTIP (procalcitonin:4,lacticidven:4)  ) Recent Results (from the past 240 hours)  Resp panel by RT-PCR (RSV, Flu A&B, Covid) Anterior Nasal Swab     Status: None   Collection Time: 05/19/24 12:28 AM   Specimen: Anterior Nasal Swab  Result Value Ref Range Status   SARS Coronavirus 2 by RT PCR NEGATIVE NEGATIVE Final   Influenza A by PCR NEGATIVE NEGATIVE Final   Influenza B by PCR NEGATIVE NEGATIVE Final    Comment: (NOTE) The Xpert Xpress SARS-CoV-2/FLU/RSV plus assay is intended as an aid in the diagnosis of influenza from Nasopharyngeal swab specimens and should not be used as a sole basis for treatment. Nasal washings and aspirates are unacceptable for Xpert Xpress SARS-CoV-2/FLU/RSV testing.  Fact Sheet for Patients: BloggerCourse.com  Fact Sheet for Healthcare Providers: SeriousBroker.it  This test is not yet approved or cleared by the [place]  FDA  and has been authorized for detection and/or diagnosis of SARS-CoV-2 by FDA under an Emergency Use Authorization (EUA). This EUA will remain in effect (meaning this test can be used) for the duration of the COVID-19 declaration under Section 564(b)(1) of the Act, 21 U.S.C. section 360bbb-3(b)(1), unless the authorization is terminated or revoked.     Resp Syncytial Virus by PCR NEGATIVE NEGATIVE Final    Comment: (NOTE) Fact Sheet for Patients: BloggerCourse.com  Fact Sheet for Healthcare Providers: SeriousBroker.it  This test is not yet approved or cleared by the [place]  FDA and has been authorized for detection and/or diagnosis of SARS-CoV-2 by FDA under an Emergency Use Authorization (EUA). This EUA will remain in effect (meaning this test can be used) for the duration of the COVID-19 declaration under Section 564(b)(1) of the Act,  21 U.S.C. section 360bbb-3(b)(1), unless the authorization is terminated or revoked.  Performed at [facility], [address] [place], [place] [address]   Culture, blood (Routine X 2) w Reflex to ID Panel     Status: None (Preliminary result)   Collection Time: 05/20/24 12:49 AM   Specimen: BLOOD  Result Value Ref Range Status   Specimen Description BLOOD LEFT ANTECUBITAL  Final   Special Requests   Final    BOTTLES DRAWN AEROBIC AND ANAEROBIC Blood Culture results may not be optimal due to an inadequate volume of blood received in culture bottles   Culture   Final    NO GROWTH 1 DAY Performed at [facility], [address], [place], [place] [address]    Report Status PENDING  Incomplete  Culture, blood (Routine X 2) w Reflex to ID Panel     Status: None (Preliminary result)   Collection Time: 05/20/24 12:49 AM   Specimen: BLOOD LEFT HAND  Result Value Ref Range Status   Specimen Description BLOOD LEFT HAND  Final   Special Requests   Final    BOTTLES DRAWN AEROBIC AND  ANAEROBIC Blood Culture results may not be optimal due to an inadequate volume of blood received in culture bottles   Culture   Final    NO GROWTH 1 DAY Performed at [facility], [address], [place], [place] [address]    Report Status PENDING  Incomplete         Radiology Studies: No results found.       Scheduled Meds:  amiodarone   200 mg Oral Daily   Chlorhexidine  Gluconate Cloth  6 each Topical Q0600   Chlorhexidine  Gluconate Cloth  6 each Topical Q0600   [START ON 05/23/2024] doxercalciferol   1 mcg Intravenous Q T,Th,Sa-HD   heparin   5,000 Units Subcutaneous Q8H   midodrine   10 mg Oral TID WC   sevelamer  carbonate  3,200 mg Oral TID WC   And   sevelamer  carbonate  1,600 mg Oral With snacks   sodium chloride  flush  3 mL Intravenous Q12H   Continuous Infusions:     LOS: 2 days    Time spent: 35 Minutes.    Fonnie Iba, MD  [facility] Pager #: [phone] 7PM-7AM contact night coverage as above

## 2024-05-21 NOTE — Progress Notes (Signed)
 MEWS Progress Note  Patient Details Name: [name] MRN: [id number] DOB: 1981/07/18 Today's Date: 05/21/2024   MEWS Flowsheet Documentation:  Assess: MEWS Score Temp: 97.8 F (36.6 C) BP: (!) 91/59 MAP (mmHg): 69 Pulse Rate: (!) 110 ECG Heart Rate: (!) 109 Resp: 18 Level of Consciousness: Alert SpO2: 100 % O2 Device: Room Air Patient Activity (if Appropriate): In bed O2 Flow Rate (L/min): 4 L/min Assess: MEWS Score MEWS Temp: 0 MEWS Systolic: 1 MEWS Pulse: 1 MEWS RR: 0 MEWS LOC: 0 MEWS Score: 2 MEWS Score Color: Yellow Assess: SIRS CRITERIA SIRS Temperature : 0 SIRS Respirations : 0 SIRS Pulse: 1 SIRS WBC: 0 SIRS Score Sum : 1 Assess: if the MEWS score is Yellow or Red Were vital signs accurate and taken at a resting state?: Yes Does the patient meet 2 or more of the SIRS criteria?: No Does the patient have a confirmed or suspected source of infection?: No MEWS guidelines implemented : No, previously yellow, continue vital signs every 4 hours        [name] 05/21/2024, 10:51 AM

## 2024-05-21 NOTE — Progress Notes (Addendum)
 Hypoglycemic Event  CBG: 46  Treatment: D50 50 mL (25 gm)  Symptoms: None  Follow-up CBG: Time:1254 CBG Result:309  Possible Reasons for Event: Inadequate meal intake and Other: dialysis  Comments/MD notified:MD notified during his rounding. See MD note for further treatment/information.

## 2024-05-22 ENCOUNTER — Inpatient Hospital Stay (HOSPITAL_COMMUNITY)

## 2024-05-22 DIAGNOSIS — I5023 Acute on chronic systolic (congestive) heart failure: Secondary | ICD-10-CM | POA: Diagnosis not present

## 2024-05-22 LAB — RENAL FUNCTION PANEL
Albumin: 2.9 g/dL — ABNORMAL LOW (ref 3.5–5.0)
Anion gap: 11 (ref 5–15)
BUN: 55 mg/dL — ABNORMAL HIGH (ref 6–20)
CO2: 27 mmol/L (ref 22–32)
Calcium: 7.7 mg/dL — ABNORMAL LOW (ref 8.9–10.3)
Chloride: 95 mmol/L — ABNORMAL LOW (ref 98–111)
Creatinine, Ser: 4.85 mg/dL — ABNORMAL HIGH (ref 0.44–1.00)
GFR, Estimated: 11 mL/min — ABNORMAL LOW (ref >60)
Glucose, Bld: 103 mg/dL — ABNORMAL HIGH (ref 70–99)
Phosphorus: 3.3 mg/dL (ref 2.5–4.6)
Potassium: 5 mmol/L (ref 3.5–5.1)
Sodium: 133 mmol/L — ABNORMAL LOW (ref 135–145)

## 2024-05-22 LAB — GLUCOSE, CAPILLARY
Glucose-Capillary: 110 mg/dL — ABNORMAL HIGH (ref 70–99)
Glucose-Capillary: 85 mg/dL (ref 70–99)
Glucose-Capillary: 92 mg/dL (ref 70–99)

## 2024-05-22 MED ORDER — CHLORHEXIDINE GLUCONATE CLOTH 2 % EX PADS
6.0000 | MEDICATED_PAD | Freq: Every day | CUTANEOUS | Status: DC
  Administered 2024-05-22: 6 via TOPICAL

## 2024-05-22 MED ORDER — LANCETS MISC. MISC: 1.0000 | Freq: Three times a day (TID) | 0 refills | Status: AC

## 2024-05-22 MED ORDER — DOXERCALCIFEROL 4 MCG/2ML IV SOLN: 1.0000 ug | INTRAVENOUS | Status: DC

## 2024-05-22 MED ORDER — BLOOD GLUCOSE TEST VI STRP: 1.0000 | ORAL_STRIP | Freq: Three times a day (TID) | 0 refills | Status: DC

## 2024-05-22 MED ORDER — LANCET DEVICE MISC: 1.0000 | Freq: Three times a day (TID) | 0 refills | Status: AC

## 2024-05-22 MED ORDER — BLOOD GLUCOSE MONITORING SUPPL DEVI: 1.0000 | Freq: Three times a day (TID) | 0 refills | Status: DC

## 2024-05-22 MED ORDER — DEXCOM G5 MOBILE TRANSMITTER MISC: 1 refills | Status: DC

## 2024-05-22 MED ORDER — DEXCOM G5 MOB/G4 PLAT SENSOR MISC: 1 refills | Status: DC

## 2024-05-22 NOTE — Inpatient Diabetes Management (Signed)
 Inpatient Diabetes Program Recommendations  AACE/ADA: New Consensus Statement on Inpatient Glycemic Control (2015)  Target Ranges:  Prepandial:   less than 140 mg/dL      Peak postprandial:   less than 180 mg/dL (1-2 hours)      Critically ill patients:  140 - 180 mg/dL    Latest Reference Range & Units 05/21/24 04:39 05/21/24 11:01 05/21/24 11:32 05/21/24 11:58 05/21/24 12:54 05/21/24 16:02 05/21/24 16:15 05/21/24 19:59  Glucose-Capillary 70 - 99 mg/dL 82 65 (L) 67 (L) 46 (L) 309 (H) 69 (L) 87 82  (L): Data is abnormally low (H): Data is abnormally high  Latest Reference Range & Units 05/22/24 00:19 05/22/24 04:12 05/22/24 10:38  Glucose-Capillary 70 - 99 mg/dL 85 960 (H) 92  (H): Data is abnormally high    Admit with:  ESRD with missed HD session, on TTS HD Acute hypoxic respiratory failure secondary to pulmonary edema, volume overload Acute exacerbation heart failure with reduced ejection fraction, BiV failure  Severe range hypoglycemia  No History of Diabetes  Current Orders: Checking CBGs Q4 hours    Note D10% IVF stopped 1am on 05/24 Per MD notes 05/25, question pseudohypoglycemia  CBGs have been stable since MN today  Received page from Manya Sells, RN care management team asking me to call MD to discuss possibility for sending pt home on CGM. Called Dr. Sandria Cruise and spoke with him about pt's HYPO issues.  Since pt does not have formal diagnosis of diabetes, her Medicaid insurance may not cover the cost of a CGM system.  Dr. Sandria Cruise told me pt willing to pay out of pocket price.  I relayed to Dr. Sandria Cruise that the lowest out of price CGM system is the [facility] for $75 for 2 sensors per month.  Dr. Jacqulyn Maxcy asked me to send him the order numbers for this CGM which I did.  I also requested that MD also give pt a Rx for traditional fingerstick glucose meter as we ask pt's to always check fingerstick reading when CGM reads low or high or when symptoms don't match CGM  reading.  MD asked me to call pt to discuss CGM systems.  I called pt by phone and discussed going home on the [facility] 3 plus system (this diabetes RN is currently at [facility]).  I discussed with pt that Medicaid may not cover the cost and that out of pocket price is $75 for 2 sensors.  Pt seemed hesitant about the OOP price but said she would try it.  I verbally Reviewed [employer] 3 CGM system (how to use, how to place new sensor, sensor life, troubleshooting, warm-up time, how to use reader, rotation of insertion sites, etc).  Pt instructed to replace sensor every 15 days.  Pt asked to seek refills for the sensors from their PCP.  Pt educated to check fingerstick CBG with traditional CBG meter when glucose reading does not match how they feels.  I have emailed pt (with her knowledge) an instructional video on the FSL3 plus system as well.     --Will follow patient during hospitalization--  Langston Pippins RN, MSN, CDCES Diabetes Coordinator Inpatient Glycemic Control Team Team Pager: [phone] (8a-5p)

## 2024-05-22 NOTE — Plan of Care (Signed)

## 2024-05-22 NOTE — Discharge Summary (Signed)
 Physician Discharge Summary  Patient ID: [name] MRN: [id number] DOB/AGE: 1981/03/12 43 y.o.  Admit date: 05/18/2024 Discharge date: 05/22/2024  Admission Diagnoses:  Discharge Diagnoses:  Active Problems:   ESRD on dialysis (HCC)   Acute on chronic systolic CHF (congestive heart failure) (HCC)   Atrial fibrillation with RVR (HCC)   Acute hypoxemic respiratory failure (HCC)   Volume overload   Hypoglycemia   Dyspnea   Discharged Condition: {condition:18240}  Hospital Course: ***  Consults: {consultation:18241}  Significant Diagnostic Studies: {diagnostics:18242}  Treatments: {Tx:18249}  Discharge Exam: Blood pressure (!) 100/56, pulse (!) 106, temperature 98.1 F (36.7 C), temperature source Oral, resp. rate 18, height 5\' 3"  (1.6 m), weight 40.2 kg, SpO2 92%. {physical ONGE:9528413}  Disposition: Discharge disposition: 01-Home or Self Care       Discharge Instructions     Diet - low sodium heart healthy   Complete by: As directed    Discharge wound care:   Complete by: As directed    Continue current wound care plan.   Increase activity slowly   Complete by: As directed       Allergies as of 05/22/2024       Reactions   Cephalosporins Rash, Other (See Comments)   To both keflex  and cefazolin .  Potential bullous lesion from Ceftriaxone  11/2023.   Tobramycin Sulfate Swelling   Eye swelling   Vancomycin  Swelling        Medication List     STOP taking these medications    acetaminophen  500 MG tablet Commonly known as: TYLENOL    cinacalcet  30 MG tablet Commonly known as: SENSIPAR    LOPERAMIDE  HCL PO       TAKE these medications    amiodarone  200 MG tablet Commonly known as: PACERONE  Take 1 tablet (200 mg total) by mouth daily.   b complex-vitamin c-folic acid  0.8 MG Tabs tablet Take 1 tablet by mouth as needed (Supplement).   Biofreeze 10 % Crea Generic drug: Menthol  (Topical Analgesic) Apply topically.   Blood Glucose  Monitoring Suppl Devi 1 each by Does not apply route in the morning, at noon, and at bedtime. May substitute to any manufacturer covered by patient's insurance.   BLOOD GLUCOSE TEST STRIPS Strp 1 each by In Vitro route in the morning, at noon, and at bedtime. May substitute to any manufacturer covered by patient's insurance.   Dexcom G5 Mob/G4 Plat Sensor Misc Apply to the arm   Dexcom G5 Mobile Transmitter Misc Continuous transmitter   doxercalciferol  4 MCG/2ML injection Commonly known as: HECTOROL  Inject 0.5 mLs (1 mcg total) into the vein Every Tuesday,Thursday,and Saturday with dialysis. Start taking on: May 23, 2024   Lancet Device Misc 1 each by Does not apply route in the morning, at noon, and at bedtime. May substitute to any manufacturer covered by patient's insurance.   Lancets Misc. Misc 1 each by Does not apply route in the morning, at noon, and at bedtime. May substitute to any manufacturer covered by patient's insurance.   midodrine  10 MG tablet Commonly known as: PROAMATINE  Take 1 tablet (10 mg total) by mouth 3 (three) times daily with meals.   sevelamer  carbonate 800 MG tablet Commonly known as: RENVELA  Take 4 tablets (3,200 mg total) by mouth 3 (three) times daily with meals. What changed:  how much to take additional instructions               Discharge Care Instructions  (From admission, onward)  Start     Ordered   05/22/24 0000  Discharge wound care:       Comments: Continue current wound care plan.   05/22/24 1129             Signed: Doroteo Gasmen 05/22/2024, 11:29 AM

## 2024-05-22 NOTE — Progress Notes (Signed)
 [facility] Progress Note   Subjective:   Reports she is tired this AM but otherwise ok. Denies SOB, CP, dizziness, nausea. Asking about going home today.   Objective Vitals:   05/22/24 0226 05/22/24 0408 05/22/24 0724 05/22/24 1042  BP: (!) 96/57 (!) 103/50 (!) 92/51 (!) 100/56  Pulse: (!) 107 88 (!) 106 (!) 106  Resp: 20 19 20 18   Temp: 97.8 F (36.6 C) 98.3 F (36.8 C) (!) 97.4 F (36.3 C) 98.1 F (36.7 C)  TempSrc: Oral Oral Axillary Oral  SpO2: 100% 90% 100% 92%  Weight:  40.2 kg    Height:       Physical Exam General:Alert female in NAD Heart: RRR, no murmurs, rubs or gallops Lungs: CTA bilaterally, respirations unlabored on RA Abdomen: Soft, non-distended, +BS Extremities: No edema b/l lower extremities Dialysis Access: RUE AVF + t/b  Additional Objective Labs: Basic Metabolic Panel: Recent Labs  Lab 05/20/24 0049 05/21/24 1205 05/22/24 0311  NA 134* 134* 133*  K 4.0 4.4 5.0  CL 92* 95* 95*  CO2 24 23 27   GLUCOSE 83 101* 103*  BUN 49* 38* 55*  CREATININE 4.46* 3.83* 4.85*  CALCIUM  7.6* 7.9* 7.7*  PHOS 4.0 2.7 3.3   Liver Function Tests: Recent Labs  Lab 05/20/24 0049 05/21/24 1205 05/22/24 0311  ALBUMIN  3.1* 3.0* 2.9*   No results for input(s): "LIPASE", "AMYLASE" in the last 168 hours. CBC: Recent Labs  Lab 05/18/24 2210 05/18/24 2216 05/19/24 0618 05/20/24 0049  WBC 5.7  --  5.2 6.3  NEUTROABS  --   --   --  4.5  HGB 13.5 15.6*  15.3* 10.8* 12.6  HCT 43.3 46.0  45.0 33.7* 38.2  MCV 91.5  --  89.4 87.6  PLT 234  --  204 257   Blood Culture    Component Value Date/Time   SDES BLOOD LEFT ANTECUBITAL 05/20/2024 0049   SDES BLOOD LEFT HAND 05/20/2024 0049   SPECREQUEST  05/20/2024 0049    BOTTLES DRAWN AEROBIC AND ANAEROBIC Blood Culture results may not be optimal due to an inadequate volume of blood received in culture bottles   SPECREQUEST  05/20/2024 0049    BOTTLES DRAWN AEROBIC AND ANAEROBIC Blood Culture  results may not be optimal due to an inadequate volume of blood received in culture bottles   CULT  05/20/2024 0049    NO GROWTH 2 DAYS Performed at [facility] Lab, [address], [place], [place] [address]    CULT  05/20/2024 0049    NO GROWTH 2 DAYS Performed at [facility], [address], [place], [place] [address]    REPTSTATUS PENDING 05/20/2024 0049   REPTSTATUS PENDING 05/20/2024 0049    Cardiac Enzymes: No results for input(s): "CKTOTAL", "CKMB", "CKMBINDEX", "TROPONINI" in the last 168 hours. CBG: Recent Labs  Lab 05/21/24 1615 05/21/24 1959 05/22/24 0019 05/22/24 0412 05/22/24 1038  GLUCAP 87 82 85 110* 92   Iron  Studies: No results for input(s): "IRON ", "TIBC", "TRANSFERRIN", "FERRITIN" in the last 72 hours. @lablastinr3 @ Studies/Results: DG CHEST PORT 1 VIEW Result Date: 05/22/2024 CLINICAL DATA:  Shortness of breath. EXAM: PORTABLE CHEST 1 VIEW COMPARISON:  05/18/2024 FINDINGS: The cardio pericardial silhouette is enlarged. Diffuse interstitial opacity is similar to prior. Possible small left pleural effusion. No acute bony abnormality. 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 Telemetry leads overlie the chest. IMPRESSION: Enlargement of the cardiopericardial silhouette with diffuse interstitial opacity, similar to prior. Imaging features suggest  edema. Electronically Signed   By: Donnal Fusi M.D.   On: 05/22/2024 09:11   Medications:   amiodarone   200 mg Oral Daily   Chlorhexidine  Gluconate Cloth  6 each Topical Q0600   Chlorhexidine  Gluconate Cloth  6 each Topical Q0600   [START ON 05/23/2024] doxercalciferol   1 mcg Intravenous Q T,Th,Sa-HD   heparin   5,000 Units Subcutaneous Q8H   midodrine   10 mg Oral TID WC   sevelamer  carbonate  3,200 mg Oral TID WC   And   sevelamer  carbonate  1,600 mg Oral With snacks   sodium chloride  flush  3 mL Intravenous Q12H    Dialysis Orders: TTS - [facility] 3hrs30min, BFR  400, DFR Auto 1.5,  EDW 37.5kg (just lowered), 2K/ 2Ca Heparin  None-noted hx subdural hematoma Mircera 30 mcg q4wks - last 05/02/24 Hectorol  1mcg IV qHD - last 05/16/24    Assessment/Plan: Acute hypoxic respiratory failure/volume overload - recently discharged for this issue. Missed HD 5/22. She received HD 5/23 and again on 5/24. Next HD 5/27 if she remains inpatient. ESRD - on HD TTS. HD 5/23 and scheduled again for today per her usual schedule. K+ stable-now 4 after getting Lokelma . Patient has a wound left thigh but we do not feel this is calciphylaxis per outpatient notes from her primary Nephrologist. She is followed by wound care in outpatient.  Chronic hypotension/volume - Bps are soft, on Midodrine  TID. CXR showed central congestion and mild diffuse interstitial opacity consistent with pulmonary edema. CXR today still suggestive of edema but she is clinically improved. Continue to titrate down volume as BP tolerates.  Anemia of CKD - Hgb 12.6. Hold Fe/ESA for now but will restart if needed Secondary Hyperparathyroidism -  Hectorol  and Renvela  resumed. Corr Ca low so stopped Sensipar  for now. Nutrition - Renal diet with fluid restriction GOC - It was noted of our team discussing with her in outpatient about her overall health decline. She has stated clearly that she does not want to stop dialysis.   Ramona Burner, PA-C 05/22/2024, 10:55 AM  [facility] Pager: [phone]

## 2024-05-22 NOTE — Plan of Care (Signed)
  Problem: Tissue Perfusion: Goal: Adequacy of tissue perfusion will improve Outcome: Progressing   Problem: Clinical Measurements: Goal: Will remain free from infection Outcome: Progressing Goal: Respiratory complications will improve Outcome: Progressing Goal: Cardiovascular complication will be avoided Outcome: Progressing   Problem: Safety: Goal: Ability to remain free from injury will improve Outcome: Progressing

## 2024-05-22 NOTE — TOC Transition Note (Addendum)
 Transition of Care [facility]) - Discharge Note   Patient Details  Name: [name] MRN: [id number] Date of Birth: 12/03/1981  [facility]) CM/SW Contact:  Jennett Model, RN Phone Number: 05/22/2024, 11:54 AM   Clinical Narrative:    For dc today, Mom will be transporting home. Diabetes educator has spoken with MD. She will continue her outpatient physical therapy that she was doing pta.         Patient Goals and CMS Choice            Discharge Placement                       Discharge Plan and Services Additional resources added to the After Visit Summary for                                       Social Drivers of Health (SDOH) Interventions SDOH Screenings   Food Insecurity: No Food Insecurity (05/20/2024)  Housing: Low Risk  (05/20/2024)  Transportation Needs: No Transportation Needs (05/20/2024)  Utilities: Not At Risk (05/20/2024)  Depression (PHQ2-9): Low Risk  (04/29/2022)  Tobacco Use: High Risk (05/18/2024)     Readmission Risk Interventions    05/22/2024   11:49 AM 04/17/2022    8:36 AM  Readmission Risk Prevention Plan  Transportation Screening Complete Complete  PCP or Specialist Appt within 3-5 Days  Complete  HRI or Home Care Consult  Complete  Social Work Consult for Recovery Care Planning/Counseling  Complete  Palliative Care Screening  Not Applicable  Medication Review [facility]) Complete Complete  PCP or Specialist appointment within 3-5 days of discharge Complete   HRI or Home Care Consult Complete   Palliative Care Screening Not Applicable   Skilled Nursing Facility Not Applicable

## 2024-05-22 NOTE — Discharge Planning (Signed)
 [facility] Patient Discharge Orders- [facility] CLINIC: [place]   Patient's name: [name] Admit/DC Dates: 05/18/2024 - 05/22/24  Discharge Diagnoses: Pulmonary edema/hyperkalemia due to missed HD   hypoglycemia  Aranesp : Given: none   Date and amount of last dose: N/A  Last Hgb: 12.6 PRBC's Given: none Date/# of units: N/A ESA dose for discharge: none IV Iron  dose at discharge: none  Heparin  change: no  EDW Change: no New EDW:   [place] Change: no  Access intervention/Change: no Details:  Hectorol /Calcitriol  change: no, sensipar  held due to low calcium   Discharge Labs: Calcium  7.7 Phosphorus 3.3 Albumin  2.9 K+ 5.0  IV Antibiotics: no Details:  On Coumadin?: no Last INR: Next INR: Managed By:   OTHER/APPTS/LAB ORDERS:   D/C Meds to be reconciled by nurse after every discharge.  Completed By: Ramona Burner, PA-C 05/22/2024, 11:54 AM  [facility] Pager: [phone]    Reviewed by: MD:______ RN_______

## 2024-05-22 NOTE — TOC CM/SW Note (Signed)
 Transition of Care [facility]) - Inpatient Brief Assessment   Patient Details  Name: [name] MRN: [id number] Date of Birth: 11-26-1981  [facility] CM/SW Contact:    Jennett Model, RN Phone Number: 05/22/2024, 11:52 AM   Clinical Narrative: From home with mom, has PCP and insurance on file, states has no HH services in place at this time, has shower chair at home.  States family member will transport them home at [employer] and family is support system, states gets medications from [facility] on [address].  Pta self ambulatory.    Transition of Care Asessment: Insurance and Status: Insurance coverage has been reviewed Patient has primary care physician: Yes Home environment has been reviewed: lives home with mom Prior level of function:: indep Prior/Current Home Services: Current home services Social Drivers of Health Review: SDOH reviewed no interventions necessary Readmission risk has been reviewed: Yes Transition of care needs: transition of care needs identified, TOC will continue to follow

## 2024-05-22 NOTE — Progress Notes (Signed)
 Discharge instructions (including medications) discussed with and copy provided to patient/caregiver

## 2024-05-23 ENCOUNTER — Telehealth: Payer: Self-pay | Admitting: Physical Therapy

## 2024-05-23 ENCOUNTER — Telehealth: Payer: Self-pay

## 2024-05-23 ENCOUNTER — Ambulatory Visit: Payer: Self-pay | Admitting: Family

## 2024-05-23 ENCOUNTER — Telehealth: Payer: Self-pay | Admitting: Physician Assistant

## 2024-05-23 LAB — HEPATITIS B SURFACE ANTIBODY, QUANTITATIVE: Hep B S AB Quant (Post): 86.9 m[IU]/mL

## 2024-05-23 LAB — ANTI-DNA ANTIBODY, DOUBLE-STRANDED
ds DNA Ab: 1 [IU]/mL (ref 0–9)
ds DNA Ab: 1 [IU]/mL (ref 0–9)

## 2024-05-23 NOTE — Telephone Encounter (Signed)
 Transition of care contact from inpatient facility  Date of Discharge: 05/22/24 Date of Contact: 05/23/24 Method of contact: Phone  Attempted to contact patient to discuss transition of care from inpatient admission. Patient did not answer the phone. Unable to leave message.  Ramona Burner, PA-C 05/23/2024, 1:12 PM  [employer] Pager: [phone]

## 2024-05-23 NOTE — Telephone Encounter (Signed)
 PT attempted to call pt and inform of discharge from clinic due to 2 recent hospitalizations and needing clearance from MD to return for another evaluation due to status change.  Was unable to reach pt and did not LVM due to unidentified mailbox.  Marilou Showman, PT, DPT

## 2024-05-23 NOTE — Transitions of Care (Post Inpatient/ED Visit) (Signed)
   05/23/2024  Name: [name] MRN: [id number] DOB: 1981/06/26  Today's TOC FU Call Status: Today's TOC FU Call Status:: Unsuccessful Call (1st Attempt) Unsuccessful Call (1st Attempt) Date: 05/23/24  Attempted to reach the patient regarding the most recent Inpatient/ED visit. Left generic confidential voice mail as patient identified self on outgoing message.   Follow Up Plan: Additional outreach attempts will be made to reach the patient to complete the Transitions of Care (Post Inpatient/ED visit) call.    Katheryn Pandy MSN, RN RN Case Sales executive Health  [facility]-Population Health Office Hours M-F [phone] Direct Dial: [phone] Main Phone [phone]  Fax: [phone] [email or web address]

## 2024-05-23 NOTE — Progress Notes (Signed)
 Late Note Entry- May 23, 2024  Pt was d/c yesterday. Contacted [facility] this morning to be advised of pt's d/c date and that pt should resume care today.   Lauraine Polite Renal Navigator [phone]

## 2024-05-24 ENCOUNTER — Telehealth: Payer: Self-pay

## 2024-05-24 NOTE — Transitions of Care (Post Inpatient/ED Visit) (Signed)
   05/24/2024  Name: [name] MRN: [id number] DOB: Dec 02, 1981  Today's TOC FU Call Status: Today's TOC FU Call Status:: Unsuccessful Call (2nd Attempt) Unsuccessful Call (2nd Attempt) Date: 05/24/24  Attempted to reach the patient regarding the most recent Inpatient/ED visit.  Follow Up Plan: Additional outreach attempts will be made to reach the patient to complete the Transitions of Care (Post Inpatient/ED visit) call.    Katheryn Pandy MSN, RN RN Case Sales executive Health  [facility]BCI-Population Health Office Hours M-F [phone] Direct Dial: [phone] Main Phone [phone]  Fax: [phone] [email or web address]

## 2024-05-25 ENCOUNTER — Telehealth: Payer: Self-pay

## 2024-05-25 LAB — CULTURE, BLOOD (ROUTINE X 2)
Culture: NO GROWTH
Culture: NO GROWTH

## 2024-05-25 NOTE — Transitions of Care (Post Inpatient/ED Visit) (Addendum)
   05/25/2024  Name: [name] MRN: [id number] DOB: [birth year]  Today's TOC FU Call Status: Today's TOC FU Call Status:: Unsuccessful Call (3rd Attempt) Unsuccessful Call (3rd Attempt) Date: 05/25/24  Attempted to reach the patient regarding the most recent Inpatient/ED visit. Upon chart review for this outreach attempt, it was noted that patient has a PCP post discharge HFU appointment scheduled for 05/31/24.   Follow Up Plan: No further outreach attempts will be made at this time. We have been unable to contact the patient.   Katheryn Pandy MSN, RN RN Case Sales executive Health  [facility]BC[facility]-Population Health Office Hours M-F [phone] Direct Dial: [phone] Main Phone [phone]  Fax: [phone] [email or web address]

## 2024-05-29 LAB — OPIATES,MS,WB/SP RFX
6-Acetylmorphine: NEGATIVE
Codeine: NEGATIVE ng/mL
Dihydrocodeine: NEGATIVE ng/mL
Hydrocodone: NEGATIVE ng/mL
Hydromorphone: NEGATIVE ng/mL
Morphine: NEGATIVE ng/mL
Opiate Confirmation: NEGATIVE

## 2024-05-29 LAB — OXYCODONES,MS,WB/SP RFX
Oxycocone: 1.3 ng/mL
Oxycodones Confirmation: POSITIVE
Oxymorphone: NEGATIVE ng/mL

## 2024-05-30 LAB — THC,MS,WB/SP RFX
Cannabidiol: NEGATIVE ng/mL
Cannabinoid Confirmation: NEGATIVE
Carboxy-THC: NEGATIVE ng/mL
Hydroxy-THC: NEGATIVE ng/mL
Tetrahydrocannabinol(THC): NEGATIVE ng/mL

## 2024-05-31 ENCOUNTER — Ambulatory Visit: Admitting: Physical Therapy

## 2024-05-31 ENCOUNTER — Encounter: Payer: Self-pay | Admitting: Family

## 2024-05-31 ENCOUNTER — Ambulatory Visit: Admitting: Occupational Therapy

## 2024-05-31 ENCOUNTER — Ambulatory Visit (INDEPENDENT_AMBULATORY_CARE_PROVIDER_SITE_OTHER): Admitting: Family

## 2024-05-31 VITALS — BP 127/53 | HR 110 | Temp 98.7°F | Resp 20 | Ht 62.0 in | Wt 89.2 lb

## 2024-05-31 DIAGNOSIS — N186 End stage renal disease: Secondary | ICD-10-CM | POA: Diagnosis not present

## 2024-05-31 DIAGNOSIS — F419 Anxiety disorder, unspecified: Secondary | ICD-10-CM

## 2024-05-31 DIAGNOSIS — M2550 Pain in unspecified joint: Secondary | ICD-10-CM

## 2024-05-31 DIAGNOSIS — L700 Acne vulgaris: Secondary | ICD-10-CM

## 2024-05-31 DIAGNOSIS — E162 Hypoglycemia, unspecified: Secondary | ICD-10-CM

## 2024-05-31 DIAGNOSIS — I352 Nonrheumatic aortic (valve) stenosis with insufficiency: Secondary | ICD-10-CM

## 2024-05-31 DIAGNOSIS — F32A Depression, unspecified: Secondary | ICD-10-CM

## 2024-05-31 DIAGNOSIS — I4891 Unspecified atrial fibrillation: Secondary | ICD-10-CM

## 2024-05-31 DIAGNOSIS — I5023 Acute on chronic systolic (congestive) heart failure: Secondary | ICD-10-CM | POA: Diagnosis not present

## 2024-05-31 DIAGNOSIS — I050 Rheumatic mitral stenosis: Secondary | ICD-10-CM

## 2024-05-31 DIAGNOSIS — I351 Nonrheumatic aortic (valve) insufficiency: Secondary | ICD-10-CM

## 2024-05-31 DIAGNOSIS — Z0120 Encounter for dental examination and cleaning without abnormal findings: Secondary | ICD-10-CM

## 2024-05-31 DIAGNOSIS — F413 Other mixed anxiety disorders: Secondary | ICD-10-CM

## 2024-05-31 DIAGNOSIS — Z09 Encounter for follow-up examination after completed treatment for conditions other than malignant neoplasm: Secondary | ICD-10-CM

## 2024-05-31 DIAGNOSIS — J9601 Acute respiratory failure with hypoxia: Secondary | ICD-10-CM

## 2024-05-31 DIAGNOSIS — E8770 Fluid overload, unspecified: Secondary | ICD-10-CM

## 2024-05-31 DIAGNOSIS — R0600 Dyspnea, unspecified: Secondary | ICD-10-CM

## 2024-05-31 MED ORDER — BIOFREEZE 10 % EX CREA: 1.0000 | TOPICAL_CREAM | Freq: Every day | CUTANEOUS | 1 refills | Status: DC | PRN

## 2024-05-31 MED ORDER — SEVELAMER CARBONATE 800 MG PO TABS: 3200.0000 mg | ORAL_TABLET | Freq: Three times a day (TID) | ORAL | 0 refills | Status: DC

## 2024-05-31 MED ORDER — SERTRALINE HCL 25 MG PO TABS: 25.0000 mg | ORAL_TABLET | Freq: Every day | ORAL | 0 refills | Status: DC

## 2024-05-31 MED ORDER — BLOOD GLUCOSE TEST VI STRP: 1.0000 | ORAL_STRIP | Freq: Three times a day (TID) | 11 refills | Status: DC

## 2024-05-31 NOTE — Progress Notes (Signed)
 Been in and out of hospital for the last 3 months was in a nursing home then went back to hospital.  More dialysis binders, referral to dentist and cardiologist,  discuss home oxygen, need wound check and care,  needs something for blood sugar dropping and she is not diabetic, skin acne and itching really bad, pain clinic and bio freeze for back pain, test strips for blood sugar and refill on anxiety medication. Patient scored a 16 on PHQ-9.  Hospital wants her to go to the hospice however she declined stated "she would ride it out"  Her GAD7 score is a 10

## 2024-05-31 NOTE — Progress Notes (Signed)
 Patient ID: [name], female    DOB: January 12, 1981  MRN: [id number]  CC: Hospital Discharge Follow-Up  Subjective: [name] is a 43 y.o. female who presents for hospital discharge follow-up. She is accompanied by her mother.  Her concerns today include:  05/18/2024 - 05/22/2024 [facility] per MD note:  Discharge Diagnoses:  Active Problems:   ESRD on dialysis (HCC)   Acute on chronic systolic CHF (congestive heart failure) (HCC)   Atrial fibrillation with RVR (HCC)   Acute hypoxemic respiratory failure (HCC)   Volume overload   Hypoglycemia   Dyspnea     Discharged Condition: stable   Hospital Course: Patient is a 43 year old female, cachectic, with past medical history significant for ESRD on TTS HD, SLE with lupus nephritis, heart failure with reduced ejection fraction, atrial flutter but not on anticoagulation due to prior subdural hematoma, aortic regurgitation, mitral stenosis and prior history of polysubstance use.  Patient was admitted with worsening shortness of breath, pulmonary edema, worsening pain at the tip of fingers when exposed to cold (Buerger's disease/thromboangitis obliterans), hyperkalemia, hypocalcemia, hyperphosphatemia, acidosis and severe hypoglycemia (blood sugar of less than 10).  Patient was also reported to have missed hemodialysis.  Patient was admitted for further assessment and management.  Patient was initially managed with D10 drip.  Cortisol level within normal range.  Blood sugar continue to fluctuate during the hospital stay.  The plan is to discharge patient on continuous blood sugar monitor (example Dexcom).  Caloric count was started during the hospital stay.  Patient underwent hemodialysis and edema and shortness of breath improved.  Echocardiogram revealed left ventricular EF 35 to 40%.  Patient will follow-up with cardiology team on discharge.     History ESRD with missed HD session, on TTS HD Acute hypoxic respiratory failure  secondary to pulmonary edema, volume overload Hyperkalemia, improving  AGMA  Elevated BUN without clinical uremia  -Missed HD session due to symptoms of pain, diarrhea.   -On initial evaluation by EMS hypoxic requiring 6 L of O2.  -Chest x-ray with pulmonary edema. K 6.4 -> 5.9, + EKG changes with peaked T waves. Bicarb 15, AG 28. BUN 82.  -EDP consulted nephrology Dr. Britta Candy who arrange hemodialysis -Patient was managed with Lokelma . - Continued home midodrine  10 mg 3 times daily for blood pressure support - Continued home Sevelamer , Cinacalcet  - Nephrology team directed care for ESRD.   Acute exacerbation heart failure with reduced ejection fraction, BiV failure  Moderate to severe aortic regurgitation Mild mitral stenosis -Last TTE 12/' 24 LVEF 30 to 35%, global hypokinesis, moderately reduced RV systolic function, mild MS, moderate to severe AI.  BNP greater than 4500.  High-sensitivity troponin elevated per below.  Etiology of heart failure exacerbation suspect mainly secondary to volume overload from missed dialysis, may have component of chronic myocardial injury and tachyarrhythmia contributing as well. -Volume management per HD - Repeat echo is as documented below. -Intolerant of GDMT due to hypotension -Follow-up with cardiology team on discharge   Atrial fibrillation with rapid ventricular response, Initial EKG A-fib rate 128; rates in the ED mainly ~low 100s.  Suspect trigger is volume overload - Continue home amiodarone  200 mg daily.  Rate control acceptable for now, follow response with HD, if worsening rates would start on Amio bolus and drip -Not on anticoagulation due to prior history of subdural hematoma 05/21/2024: Heart rate has improved significantly.   Severe range hypoglycemia Question possible pseudohypoglycemia considering she was asymptomatic despite markedly severe  hypoglycemia with nadir less than 10.  Not on diabetic medications.  Etiology if this was true  hypoglycemia is unclear. - Continue D10 drip reduced rate to 25 cc an hour considering her ESRD and volume overload. - Check cosyntropin  stim test in the morning. - If she is to have another episode of hypoglycemia per fingerstick would draw a stat serum glucose for comparison and then treat her hypoglycemia protocol 05/19/2024: Continue to monitor closely. 05/20/2024: Stable.  No hypoglycemia.  Patient reports having good appetite. 05/21/2024: Blood sugar of 46 noted today.  Will proceed with calorie count.  Check cortisol level.   Chronic myocardial injury:  -Chronic elevation in HS troponin currently flat at 153 which is similar to prior. Demand in the setting of her volume overload, hypoxia, arrhythmia.    Arthralgias in hands and feet  Question ischemic pain v lupus flare -Likely Buerger's disease.   -No signs of lupus flare. -Checked ESR, DS DNA ab    Chronic medical problems: History of polysubstance use: Check serum tox   Body mass index is 16.01 kg/m.  Moderate protein calorie malnutrition   Today's office visit 05/31/2024: - Reports feeling improved since hospital discharge. Denies red flag symptoms. States she needs refills of Sevelamer  Carbonate (managed by Nephrology). She confirms that she does not need any additional refills. - Established with Nephrology for dialysis and related chronic conditions.  - Needs referral to Cardiology. She requests [name] Donning, MD. She does not complain of red flag symptoms such as but not limited to chest pain, shortness of breath, worst headache of life, nausea/vomiting.  - States blood sugars are still low on some days. Reports she needs refills of diabetic test strips only. - States at hospital discharge she was told to get prescription for home oxygen use from Primary Care. States she usually uses 3 liters of oxygen. Denies red flag symptoms. - States she was seeing Wound Clinic for wound underneath leg. States the wound has improved  since began in December 2024 and Nephrology currently watching closely. She declines examination of area of concern on today. She declines referral back to Wound Clinic. - Anxiety depression related to medical conditions. She would like to try an anxiety depression medication to see if this helps. She denies thoughts of self-harm, suicidal ideations, homicidal ideations. - Chronic pain. History of lupus. Requests referral to Pain Clinic and refill of Biofreeze.  - Acne. Also, states dialysis affects her skin. Requests referral to Dermatology. - Needs referral to Dentistry for routine exam. - Patient states at hospital discharge it was recommended that she establish with Hospice. Today patient states she does not want to establish with Hospice. - No further issues/concerns for discussion today.    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/23/2024   Open thigh wound 01/23/2024   Thrombocytopenia (HCC) 12/14/2023   Atrial fibrillation with RVR (HCC) 12/14/2023   Acute on chronic heart failure (HCC) 12/06/2022   Seizures (HCC) 12/04/2022   R Subdural hematoma (HCC) 12/04/2022   Acute on chronic systolic CHF (congestive heart failure) (HCC) 11/30/2022   Nonrheumatic aortic valve insufficiency 11/30/2022   Nonrheumatic mitral valve regurgitation 11/30/2022  Non-ischemic cardiomyopathy (HCC) 11/30/2022   Acute febrile illness 04/12/2022   Hypertensive urgency 04/12/2022   GERD (gastroesophageal reflux disease) 04/12/2022   HFrEF (heart failure with reduced ejection fraction) (HCC) 04/12/2022   Elevated troponin 04/12/2022   SIRS  (systemic inflammatory response syndrome) (HCC) 04/12/2022   Hyperkalemia, diminished renal excretion 12/15/2021   Trichomonas vaginitis 03/25/2021   Bacterial vaginitis 03/25/2021   Encounter for screening for COVID-19 01/24/2021   Hemodialysis access, fistula mature (HCC) 01/22/2021   Allergic reaction 01/22/2021   Acute hyperkalemia 07/28/2020   Nicotine  dependence, cigarettes, uncomplicated 07/28/2020   Essential hypertension 07/28/2020   Substance abuse (HCC) 07/28/2020   Hemodialysis catheter malfunction (HCC) 07/28/2020   Complication of AV dialysis fistula, sequela 07/28/2020   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HCC) 05/18/2020   Bacterial intestinal infection, unspecified 12/14/2019   Left shoulder pain 08/15/2019   Cellulitis, unspecified 05/02/2019   Hyperkalemia 07/29/2018   SVT (supraventricular tachycardia) (HCC) 07/16/2018   Cardiomyopathy, unspecified (HCC) 12/03/2016   Diarrhea 04/18/2016   Chronic combined systolic (congestive) and diastolic (congestive) heart failure (HCC) 03/16/2016   Other hypervolemia    S/P thoracentesis    Cough with hemoptysis    Myalgia    Pulmonary edema    ESRD (end stage renal disease) (HCC) 03/03/2016   Dependence on renal dialysis (HCC) 10/15/2015   Rash and nonspecific skin eruption 06/26/2015   Secondary Raynaud's phenomenon 06/24/2015   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   Fever, unspecified 08/28/2014   Pruritus, unspecified 08/28/2014   Headache, unspecified 04/30/2014   Pain, unspecified 04/30/2014   Cramping of hands 04/19/2014   Contraceptive management 04/19/2014   Mild protein-calorie malnutrition (HCC) 03/22/2014   Insomnia 03/15/2014   Benign hypertension with ESRD (end-stage renal disease) (HCC) 03/15/2014   Tobacco abuse 02/15/2014   Healthcare maintenance 02/15/2014   Iron  deficiency anemia, unspecified 02/12/2014   Coagulation defect, unspecified (HCC) 02/10/2014   Anemia due to chronic kidney disease 02/09/2014    Secondary hyperparathyroidism of renal origin (HCC) 02/09/2014   Hypoalbuminemia 02/01/2014   Nephrotic syndrome 02/01/2014   ESRD on dialysis (HCC) 01/31/2014   Pleural effusion, left 01/31/2014   Microcytic anemia 01/29/2014   Hypocalcemia 01/29/2014   Bacteremia 01/23/2013   Marijuana smoker (HCC) 01/18/2013   H/O pericarditis 01/17/2013   Lupus (systemic lupus erythematosus) (HCC) 01/17/2013   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   S/P pericardiocentesis 01/17/2013   H/O pleural effusion 01/17/2013   Nephrosis 01/17/2013   Preseptal cellulitis 01/17/2013   Cardiac tamponade 12/29/2004   Pericardial effusion (noninflammatory) 12/29/2004     Current Outpatient Medications on File Prior to Visit  Medication Sig Dispense Refill   amiodarone  (PACERONE ) 200 MG tablet Take 1 tablet (200 mg total) by mouth daily. 30 tablet 2   Blood Glucose Monitoring Suppl DEVI 1 each by Does not apply route in the morning, at noon, and at bedtime. May substitute to any manufacturer covered by patient's insurance. 1 each 0   doxercalciferol  (HECTOROL ) 4 MCG/2ML injection Inject 0.5 mLs (1 mcg total) into the vein Every Tuesday,Thursday,and Saturday with dialysis.     Lancet Device MISC 1 each by Does not apply route in the morning, at noon, and at bedtime. May substitute to any manufacturer covered by patient's insurance. 1 each 0   Lancets Misc. MISC 1 each by Does not apply route in the morning, at noon, and at bedtime. May substitute to any manufacturer covered by patient's insurance. 100 each 0   midodrine  (PROAMATINE ) 10 MG tablet Take 1 tablet (10  mg total) by mouth 3 (three) times daily with meals. 90 tablet 0   b complex-vitamin c-folic acid  (NEPHRO-VITE) 0.8 MG TABS tablet Take 1 tablet by mouth as needed (Supplement). (Patient not taking: Reported on 05/31/2024)     Continuous Glucose Sensor (DEXCOM G5 MOB/G4 PLAT SENSOR) MISC Apply to the arm (Patient not taking: Reported on 05/31/2024) 4 each 1    Continuous Glucose Transmitter (DEXCOM G5 MOBILE TRANSMITTER) MISC Continuous transmitter (Patient not taking: Reported on 05/31/2024) 1 each 1   No current facility-administered medications on file prior to visit.    Allergies  Allergen Reactions   Cephalosporins Rash and Other (See Comments)    To both keflex  and cefazolin .  Potential bullous lesion from Ceftriaxone  11/2023.   Tobramycin Sulfate Swelling    Eye swelling   Vancomycin  Swelling    Social History   Socioeconomic History   Marital status: Single    Spouse name: Not on file   Number of children: Not on file   Years of education: Not on file   Highest education level: Not on file  Occupational History   Not on file  Tobacco Use   Smoking status: Every Day    Current packs/day: 0.12    Average packs/day: 0.1 packs/day for 15.0 years (1.8 ttl pk-yrs)    Types: Cigarettes    Passive exposure: Current   Smokeless tobacco: Never   Tobacco comments:    4 cigarettes per day  Vaping Use   Vaping status: Never Used  Substance and Sexual Activity   Alcohol  use: Yes    Alcohol /week: 0.0 standard drinks of alcohol     Comment: Special Occasional takes Vicar   Drug use: Yes    Types: Marijuana    Comment: ocassional, last time- 12/21/20   Sexual activity: Not Currently    Birth control/protection: None  Other Topics Concern   Not on file  Social History Narrative   Not on file   Social Drivers of Health   Financial Resource Strain: Low Risk  (05/31/2024)   Overall Financial Resource Strain (CARDIA)    Difficulty of Paying Living Expenses: Not hard at all  Food Insecurity: No Food Insecurity (05/20/2024)   Hunger Vital Sign    Worried About Running Out of Food in the Last Year: Never true    Ran Out of Food in the Last Year: Never true  Transportation Needs: No Transportation Needs (05/20/2024)   PRAPARE - Administrator, Civil Service (Medical): No    Lack of Transportation (Non-Medical): No  Physical  Activity: Inactive (05/31/2024)   Exercise Vital Sign    Days of Exercise per Week: 0 days    Minutes of Exercise per Session: 0 min  Stress: Stress Concern Present (05/31/2024)   [employer] of Occupational Health - Occupational Stress Questionnaire    Feeling of Stress : To some extent  Social Connections: Socially Isolated (05/31/2024)   Social Connection and Isolation Panel [NHANES]    Frequency of Communication with Friends and Family: Never    Frequency of Social Gatherings with Friends and Family: Never    Attends Religious Services: Never    Database administrator or Organizations: No    Attends Banker Meetings: Never    Marital Status: Never married  Intimate Partner Violence: Not At Risk (05/20/2024)   Humiliation, Afraid, Rape, and Kick questionnaire    Fear of Current or Ex-Partner: No    Emotionally Abused: No    Physically Abused:  No    Sexually Abused: No    History reviewed. No pertinent family history.  Past Surgical History:  Procedure Laterality Date   A/V FISTULAGRAM N/A 12/15/2023   Procedure: A/V Fistulagram;  Surgeon: Patrick Boor, MD;  Location: [facility] INVASIVE CV LAB;  Service: Cardiovascular;  Laterality: N/A;   AV FISTULA PLACEMENT     AV FISTULA PLACEMENT Right 08/01/2020   Procedure: RIGHT ARM BRACHIOCEPHALIC  ARTERIOVENOUS (AV) FISTULA CREATION;  Surgeon: Mayo Speck, MD;  Location: [facility] OR;  Service: Vascular;  Laterality: Right;   BASCILIC VEIN TRANSPOSITION Left 02/05/2014   Procedure: BASCILIC VEIN TRANSPOSITION;  Surgeon: Mayo Speck, MD;  Location: [facility] OR;  Service: Vascular;  Laterality: Left;   BASCILIC VEIN TRANSPOSITION Right 01/08/2021   Procedure: RIGHT ARM SECOND STAGE BASCILIC VEIN TRANSPOSITION;  Surgeon: Mayo Speck, MD;  Location: [facility] OR;  Service: Vascular;  Laterality: Right;   BIOPSY  12/17/2021   Procedure: BIOPSY;  Surgeon: Normie Becton., MD;  Location: [facility] ENDOSCOPY;  Service: Gastroenterology;;    ESOPHAGOGASTRODUODENOSCOPY (EGD) WITH PROPOFOL  N/A 12/17/2021   Procedure: ESOPHAGOGASTRODUODENOSCOPY (EGD) WITH PROPOFOL ;  Surgeon: Normie Becton., MD;  Location: [facility] ENDOSCOPY;  Service: Gastroenterology;  Laterality: N/A;   FISTULA SUPERFICIALIZATION Left 05/30/2018   Procedure: FISTULA PLICATION BASILIC VEIN TRANSPOSITION;  Surgeon: Dannis Dy, MD;  Location: [facility] OR;  Service: Vascular;  Laterality: Left;   FISTULA SUPERFICIALIZATION Left 12/18/2019   Procedure: PLICATION OF LEFT ARTERIOVENOUS FISTULA ULCER;  Surgeon: Mayo Speck, MD;  Location: [facility] OR;  Service: Vascular;  Laterality: Left;   I & D EXTREMITY Right 02/18/2021   Procedure: IRRIGATION AND DEBRIDEMENT OF ARM;  Surgeon: Dannis Dy, MD;  Location: [facility] OR;  Service: Vascular;  Laterality: Right;   INSERTION OF DIALYSIS CATHETER N/A 05/19/2020   Procedure: TUNNELED INSERTION  OF DIALYSIS CATHETER;  Surgeon: Adine Hoof, MD;  Location: [facility] OR;  Service: Vascular;  Laterality: N/A;   PERIPHERAL VASCULAR BALLOON ANGIOPLASTY  12/15/2023   Procedure: PERIPHERAL VASCULAR BALLOON ANGIOPLASTY;  Surgeon: Patrick Boor, MD;  Location: [facility] INVASIVE CV LAB;  Service: Cardiovascular;;   THROMBECTOMY AND REVISION OF ARTERIOVENTOUS (AV) GORETEX  GRAFT Left 07/28/2020   Procedure: Oversewing of left arm Brachial cephalic fistula for bleeding.;  Surgeon: Dannis Dy, MD;  Location: [facility] OR;  Service: Vascular;  Laterality: Left;   VENOGRAM Right 01/31/2014   Procedure: DIALYSIS CATHETER;  Surgeon: Margherita Shell, MD;  Location: [facility] CATH LAB;  Service: Cardiovascular;  Laterality: Right;    ROS: Review of Systems Negative except as stated above  PHYSICAL EXAM: BP (!) 127/53   Pulse (!) 110   Temp 98.7 F (37.1 C) (Oral)   Resp 20   Ht 5\' 2"  (1.575 m)   Wt 89 lb 3.2 oz (40.5 kg)   LMP  (LMP Unknown) Comment: states over 2 years ago  SpO2 90%   BMI 16.31 kg/m   Physical Exam HENT:     Head:  Normocephalic and atraumatic.     Nose: Nose normal.     Mouth/Throat:     Mouth: Mucous membranes are moist.     Pharynx: Oropharynx is clear.  Eyes:     Extraocular Movements: Extraocular movements intact.     Conjunctiva/sclera: Conjunctivae normal.     Pupils: Pupils are equal, round, and reactive to light.  Cardiovascular:     Rate and Rhythm: Tachycardia present.     Pulses: Normal pulses.  Heart sounds: Normal heart sounds.  Pulmonary:     Effort: Pulmonary effort is normal.     Breath sounds: Normal breath sounds.  Musculoskeletal:        General: Normal range of motion.     Cervical back: Normal range of motion and neck supple.     Comments: Fistula right upper extremity appears normal.  Neurological:     General: No focal deficit present.     Mental Status: She is alert and oriented to person, place, and time.  Psychiatric:        Mood and Affect: Mood normal.        Behavior: Behavior normal.     ASSESSMENT AND PLAN: 1. Hospital discharge follow-up (Primary) - Reviewed hospital course, current medications, ensured proper follow-up in place, and addressed concerns.   2. ESRD on dialysis [facility]) - Continue present management.  - Sevelamer  Carbonate prescribed per patient request. Counseled on medication adherence/adverse effects.  - Keep all scheduled appointments with established Nephrology. - sevelamer  carbonate (RENVELA ) 800 MG tablet; Take 4 tablets (3,200 mg total) by mouth 3 (three) times daily with meals.  Dispense: 360 tablet; Refill: 0  3. Acute on chronic systolic CHF (congestive heart failure) (HCC) 4. Atrial fibrillation with RVR (HCC) 5. Aortic valve insufficiency, etiology of cardiac valve disease unspecified 6. Mitral valve stenosis, unspecified etiology 7. Hypervolemia, unspecified hypervolemia type - Patient today in office asymptomatic. - Continue present management.  - Referral to Cardiology for evaluation/management. - Ambulatory referral  to Cardiology  8. Hypoglycemia - Hemoglobin A1c 6.1% on 04/19/2024. - Glucose blood test strips prescribed per patient request. Counseled on medication adherence/adverse effects.  - Referral to Endocrinology for evaluation/management. - Glucose Blood (BLOOD GLUCOSE TEST STRIPS) STRP; 1 each by Other route in the morning, at noon, and at bedtime. May substitute to any manufacturer covered by patient's insurance.  Dispense: 100 strip; Refill: 11 - Ambulatory referral to Endocrinology  9. Dyspnea, unspecified type 10. Acute hypoxemic respiratory failure (HCC) - Patient today in office with no cardiopulmonary/acute distress.  - Home use oxygen prescribed. Counseled on medication adherence/adverse effects.  - Referral to Pulmonology for evaluation/management. - For home use only DME oxygen - Ambulatory referral to Pulmonology  11. Anxiety and depression - Patient denies thoughts of self-harm, suicidal ideations, homicidal ideations. - Sertraline as prescribed. Counseled on medication adherence/adverse effects.  - Patient declined referral to Psychiatry.  - Follow-up with primary provider in 4 weeks or sooner if needed. - sertraline (ZOLOFT) 25 MG tablet; Take 1 tablet (25 mg total) by mouth daily.  Dispense: 90 tablet; Refill: 0  12. Arthralgia, unspecified joint - Biofreeze as prescribed. Counseled on medication adherence/adverse effects. - Referral to Pain Clinic for evaluation/management. - Ambulatory referral to Pain Clinic - Menthol , Topical Analgesic, (BIOFREEZE) 10 % CREA; Apply 1 Application topically daily as needed.  Dispense: 85 g; Refill: 1  13. Acne vulgaris - Referral to Dermatology for evaluation/management. - Ambulatory referral to Dermatology  14. Encounter for routine dental examination - Referral to Dentistry for evaluation/management. - Ambulatory referral to Dentistry   Patient was given the opportunity to ask questions.  Patient verbalized understanding of the  plan and was able to repeat key elements of the plan. Patient was given clear instructions to go to Emergency Department or return to medical center if symptoms don't improve, worsen, or new problems develop.The patient verbalized understanding.   Orders Placed This Encounter  Procedures   For home use only DME oxygen  Ambulatory referral to Dentistry   Ambulatory referral to Cardiology   Ambulatory referral to Pulmonology   Ambulatory referral to Dermatology   Ambulatory referral to Pain Clinic   Ambulatory referral to Endocrinology     Requested Prescriptions   Signed Prescriptions Disp Refills   sevelamer  carbonate (RENVELA ) 800 MG tablet 360 tablet 0    Sig: Take 4 tablets (3,200 mg total) by mouth 3 (three) times daily with meals.   Glucose Blood (BLOOD GLUCOSE TEST STRIPS) STRP 100 strip 11    Sig: 1 each by Other route in the morning, at noon, and at bedtime. May substitute to any manufacturer covered by patient's insurance.   sertraline (ZOLOFT) 25 MG tablet 90 tablet 0    Sig: Take 1 tablet (25 mg total) by mouth daily.   Menthol , Topical Analgesic, (BIOFREEZE) 10 % CREA 85 g 1    Sig: Apply 1 Application topically daily as needed.    Follow-up with primary provider as scheduled.  [name] Dama, NP

## 2024-06-01 ENCOUNTER — Other Ambulatory Visit: Payer: Self-pay | Admitting: Family

## 2024-06-01 DIAGNOSIS — S81801S Unspecified open wound, right lower leg, sequela: Secondary | ICD-10-CM

## 2024-06-01 DIAGNOSIS — I7300 Raynaud's syndrome without gangrene: Secondary | ICD-10-CM

## 2024-06-01 DIAGNOSIS — R21 Rash and other nonspecific skin eruption: Secondary | ICD-10-CM

## 2024-06-01 DIAGNOSIS — S81802S Unspecified open wound, left lower leg, sequela: Secondary | ICD-10-CM

## 2024-06-01 LAB — DRUG SCREEN 10 W/CONF, SERUM
Amphetamines, IA: NEGATIVE ng/mL
Barbiturates, IA: NEGATIVE ug/mL
Benzodiazepines, IA: NEGATIVE ng/mL
Cocaine & Metabolite, IA: POSITIVE ng/mL — AB
Methadone, IA: NEGATIVE ng/mL
Opiates, IA: NEGATIVE ng/mL
Oxycodones, IA: POSITIVE ng/mL — AB
Phencyclidine, IA: NEGATIVE ng/mL
Propoxyphene, IA: NEGATIVE ng/mL
THC(Marijuana) Metabolite, IA: NEGATIVE ng/mL

## 2024-06-01 LAB — COCAINE,MS,WB/SP RFX
Benzoylecgonine: >1500 ng/mL
Cocaine Confirmation: POSITIVE
Cocaine: 31 ng/mL

## 2024-06-01 MED ORDER — TRIAMCINOLONE ACETONIDE 0.025 % EX CREA: 1.0000 | TOPICAL_CREAM | Freq: Two times a day (BID) | CUTANEOUS | 2 refills | Status: DC

## 2024-06-01 NOTE — Telephone Encounter (Signed)
-   Triamcinolone  Cream prescribed for skin. - Continue Biofreeze for pain and referral to Pain Clinic (discussed in office). Referral to Pain Clinic imperative due to patient on dialysis. Expect call soon with appointment details.  - In regards to Raynaud's disease versus Buerger's disease. The emergency department physician stated they suspected this is patient's diagnosis. Referral to Rheumatology for evaluation/management of Raynaud's disease. - During office visit patient stated that she was seen by Wound Clinic and did not follow-up due to Nephrology began evaluating her wound during dialysis appointments. Referral to Wound Clinic for evaluation/management. Expect call soon with appointment details. - Sertraline prescribed during office visit on 05/31/2024.

## 2024-06-01 NOTE — Telephone Encounter (Signed)
 I have attempted to contact this patient by phone with the following results: no answer, left message to return my call on answering machine.

## 2024-06-02 ENCOUNTER — Ambulatory Visit: Admitting: Physical Therapy

## 2024-06-07 ENCOUNTER — Ambulatory Visit: Admitting: Physical Therapy

## 2024-06-07 ENCOUNTER — Encounter: Admitting: Occupational Therapy

## 2024-06-09 ENCOUNTER — Ambulatory Visit: Admitting: Physical Therapy

## 2024-06-14 ENCOUNTER — Ambulatory Visit: Admitting: Physical Therapy

## 2024-06-14 ENCOUNTER — Encounter: Admitting: Occupational Therapy

## 2024-06-19 ENCOUNTER — Encounter: Admitting: Occupational Therapy

## 2024-06-19 ENCOUNTER — Ambulatory Visit: Admitting: Physical Therapy

## 2024-06-26 ENCOUNTER — Ambulatory Visit: Admitting: Physical Therapy

## 2024-06-26 ENCOUNTER — Encounter: Admitting: Occupational Therapy

## 2024-07-05 ENCOUNTER — Encounter: Admitting: Occupational Therapy

## 2024-07-05 ENCOUNTER — Ambulatory Visit: Admitting: Physical Therapy

## 2024-07-12 ENCOUNTER — Ambulatory Visit: Admitting: Physical Therapy

## 2024-07-12 ENCOUNTER — Encounter: Admitting: Occupational Therapy

## 2024-08-10 ENCOUNTER — Other Ambulatory Visit: Payer: Self-pay

## 2024-08-10 ENCOUNTER — Encounter (HOSPITAL_COMMUNITY)
Admission: RE | Disposition: A | Discharge status: Home / Self Care | Payer: Self-pay | Source: Home / Self Care | Attending: Vascular Surgery

## 2024-08-10 ENCOUNTER — Ambulatory Visit (HOSPITAL_COMMUNITY)
Admission: RE | Admit: 2024-08-10 | Discharge: 2024-08-10 | Disposition: A | Discharge status: Home / Self Care | Attending: Vascular Surgery | Admitting: Vascular Surgery

## 2024-08-10 DIAGNOSIS — T82898A Other specified complication of vascular prosthetic devices, implants and grafts, initial encounter: Secondary | ICD-10-CM

## 2024-08-10 DIAGNOSIS — Z992 Dependence on renal dialysis: Secondary | ICD-10-CM

## 2024-08-10 DIAGNOSIS — N186 End stage renal disease: Secondary | ICD-10-CM

## 2024-08-10 DIAGNOSIS — I9589 Other hypotension: Secondary | ICD-10-CM

## 2024-08-10 DIAGNOSIS — T82858A Stenosis of vascular prosthetic devices, implants and grafts, initial encounter: Secondary | ICD-10-CM | POA: Diagnosis not present

## 2024-08-10 DIAGNOSIS — Z9889 Other specified postprocedural states: Secondary | ICD-10-CM

## 2024-08-10 HISTORY — PX: VENOUS ANGIOPLASTY: CATH118376

## 2024-08-10 HISTORY — PX: A/V FISTULAGRAM: CATH118298

## 2024-08-10 SURGERY — A/V FISTULAGRAM
Anesthesia: LOCAL | Site: Arm Upper | Laterality: Right

## 2024-08-10 MED ORDER — IODIXANOL 320 MG/ML IV SOLN
INTRAVENOUS | Status: DC | PRN
  Administered 2024-08-10: 20 mL

## 2024-08-10 MED ORDER — ACETAMINOPHEN 325 MG PO TABS
ORAL_TABLET | ORAL | Status: AC
  Filled 2024-08-10: qty 2

## 2024-08-10 MED ORDER — MIDAZOLAM HCL 2 MG/2ML IJ SOLN
INTRAMUSCULAR | Status: DC | PRN
  Administered 2024-08-10: 1 mg via INTRAVENOUS

## 2024-08-10 MED ORDER — HEPARIN (PORCINE) IN NACL 1000-0.9 UT/500ML-% IV SOLN
INTRAVENOUS | Status: DC | PRN
  Administered 2024-08-10: 500 mL

## 2024-08-10 MED ORDER — LIDOCAINE HCL (PF) 1 % IJ SOLN
INTRAMUSCULAR | Status: DC | PRN
  Administered 2024-08-10: 2 mL via INTRADERMAL

## 2024-08-10 MED ORDER — LIDOCAINE HCL (PF) 1 % IJ SOLN
INTRAMUSCULAR | Status: AC
  Filled 2024-08-10: qty 30

## 2024-08-10 MED ORDER — MIDAZOLAM HCL 2 MG/2ML IJ SOLN
INTRAMUSCULAR | Status: AC
  Filled 2024-08-10: qty 2

## 2024-08-10 MED ORDER — ACETAMINOPHEN 325 MG PO TABS
650.0000 mg | ORAL_TABLET | Freq: Four times a day (QID) | ORAL | Status: DC | PRN
  Administered 2024-08-10: 650 mg via ORAL

## 2024-08-10 MED ORDER — FENTANYL CITRATE (PF) 100 MCG/2ML IJ SOLN
INTRAMUSCULAR | Status: AC
  Filled 2024-08-10: qty 2

## 2024-08-10 MED ORDER — FENTANYL CITRATE (PF) 100 MCG/2ML IJ SOLN
INTRAMUSCULAR | Status: DC | PRN
  Administered 2024-08-10: 50 ug via INTRAVENOUS

## 2024-08-10 SURGICAL SUPPLY — 9 items
BALLOON MUSTANG 8X60X75 (BALLOONS) IMPLANT
KIT ENCORE 26 ADVANTAGE (KITS) IMPLANT
KIT MICROPUNCTURE NIT STIFF (SHEATH) IMPLANT
SHEATH PINNACLE R/O II 6F 4CM (SHEATH) IMPLANT
SHEATH PROBE COVER 6X72 (BAG) IMPLANT
TRAY PV CATH (CUSTOM PROCEDURE TRAY) ×3 IMPLANT
TUBING CIL FLEX 10 FLL-RA (TUBING) IMPLANT
WIRE BENTSON .035X145CM (WIRE) IMPLANT
WIRE TORQFLEX AUST .018X40CM (WIRE) IMPLANT

## 2024-08-10 NOTE — Op Note (Signed)
 Patient name: [name] MRN: [id number] DOB: 02-Jul-1981 Sex: female  08/10/2024 Pre-operative Diagnosis:  ESRD, malfunction right arm AV fistula Post-operative diagnosis:  Same Surgeon:  Penne BROCKS. Sheree, MD Procedure Performed: 1.  Percutaneous ultrasound-guided cannulation right arm AV fistula in 2 locations 2.  Right upper extremity fistulogram including retrograde angiography 3.  8 mm balloon angioplasty right arm AV fistula swing segment and central veins including right subclavian, right innominate and superior vena cava using 8 mm balloon 4.  Moderate sedation with fentanyl  and Versed  for 11 minutes   Indications: 43 year old female with history of end-stage renal disease on dialysis via basilic vein fistula.  The fistula has decreased flow on dialysis and is indicated for fistulogram.  She has previously undergone balloon venoplasty of the swing segment using 10 mm balloon as well as balloon venoplasty of the right innominate vein using 8 and 10 mm balloons.  She is now indicated for repeat fistulogram.  Findings: The fistula self was heavily calcified near the arteriovenous anastomosis.  I ultimately cannulated the fistula in the mid segment which was softer.  There is very low flow throughout the fistula given the patient's relative hypotension.  There was a stenosis at the swing segment of the fistula that was approximately 75% and reduced to 0% with an 8 mm balloon and centrally there was a stenosis of the innominate vein heading into the SVC as well as subclavian vein at the wire difficulty passing and I could actually not demonstrate a lumen after placing the wire and all the flow refluxed into the jugular vein.  I completion there was a lumen and I elected to stop it 8 mm balloon venoplasty given the patient's discomfort.  Ultimately did administer sedation for a total of 11 minutes which did aid in performing balloon venoplasty.  I discussed with the patient that her fistula  is heavily calcified near the arteriovenous anastomosis and has very low flows.  If the fistula thromboses she will require catheter.  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     Procedure:  The patient was identified in the holding area and taken to the heart and vascular procedure room.  The patient was then placed supine on the table and prepped and draped in the usual sterile fashion.  A time out was called.  Ultrasound was used to evaluate the right arm AV fistula.  Near the anastomosis was calcified I found 1 soft area few centimeters from the anastomosis and anesthetized the skin and cannulated this with direct ultrasound visualization using a micropuncture needle followed by wire and sheath.  An ultrasound image was saved the permanent record.  I performed fistulogram but Fortune I could not get the sheath to direct to flow into the fistula given the heavy calcification and ultimately the sheath access was lost and I sutured ligated the cannulation site.  Pressure was held until hemostasis was obtained.  Further up on the fistula there was a soft area which was pseudoaneurysmal and this area was anesthetized and I cannulated this also with direct ultrasound visualization.  This time I was able to get a micropuncture sheath over the wire placed a Bentson wire which did stop at the swing segment and I placed a 6 French sheath.  I was able to direct across  the swing segment and then the wire stopped at the subclavian innominate junction due to stenosis.  I then ballooned the swing segment with an 8 mm balloon and this reduced to high-grade stenosis to the 0%.  I then used the balloon to direct the wire centrally and performed fistulogram and could not see the lumen at all the contrast refluxed into the IJ.  I was able to get the 8  mm balloon down and balloon from the SVC into the innominate vein and third inflation at the subclavian and innominate junction.  At completion there was brisk flow across this and I elected not to upsize the balloon given her discomfort.  We did administer moderate sedation with fentanyl  and Versed  during this time to aid in comfort but she does have relative hypotension as well.  Ultimately elected that the fistula was working well with improved central flow and I elected to terminate the procedure remove the wire and catheter and suture-ligated the cannulation site.  She tolerated the procedure without any complication.  Contrast: 23cc   Myking Sar C. Sheree, MD [facility] [place] Office: [phone] Pager: [phone]

## 2024-08-10 NOTE — H&P (Signed)
 H+P   History of Present Illness: This is a 43 y.o. female with end-stage renal disease on dialysis via right two-stage basilic vein fistula which was completed in 2022.  She has undergone percutaneous intervention of this fistula most recently in December of last year where she had successful angioplasty with 10 mm balloon of the basilic vein swing segment and also of the right innominate vein which was fully occluded.  Patient does have a history of Klebsiella bacteremia and the hope is to avoid tunneled dialysis catheter placement.  She has had decreased flows on dialysis and now indicated for fistulogram.  Past Medical History:  Diagnosis Date   Anemia    low iron  - receives iron  at dialysis   Anxiety    Arthritis    RA   Atrial fibrillation with RVR (HCC) 12/14/2023   Chronic systolic congestive heart failure (HCC) 03/16/2016   Dyspnea    ESRD (end stage renal disease) (HCC)    [facility] _ [place]   H/O pericarditis 01/17/2013   H/O pleural effusion 01/17/2013   Heart murmur    Lupus (systemic lupus erythematosus) (HCC)    Previously followed with Dr. 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 pneumonae bacteremia on 01/21/2013. Sensitive to Peniccilin     Past Surgical History:  Procedure Laterality Date   A/V FISTULAGRAM N/A 12/15/2023   Procedure: A/V Fistulagram;  Surgeon: Melia Lynwood ORN, MD;  Location: [facility] INVASIVE CV LAB;  Service: Cardiovascular;  Laterality: N/A;   AV FISTULA PLACEMENT      AV FISTULA PLACEMENT Right 08/01/2020   Procedure: RIGHT ARM BRACHIOCEPHALIC  ARTERIOVENOUS (AV) FISTULA CREATION;  Surgeon: Oris Krystal FALCON, MD;  Location: [facility] OR;  Service: Vascular;  Laterality: Right;   BASCILIC VEIN TRANSPOSITION Left 02/05/2014   Procedure: BASCILIC VEIN TRANSPOSITION;  Surgeon: Krystal FALCON Oris, MD;  Location: [facility] OR;  Service: Vascular;  Laterality: Left;   BASCILIC VEIN TRANSPOSITION Right 01/08/2021   Procedure: RIGHT ARM SECOND STAGE BASCILIC VEIN TRANSPOSITION;  Surgeon: Oris Krystal FALCON, MD;  Location: [facility] OR;  Service: Vascular;  Laterality: Right;   BIOPSY  12/17/2021   Procedure: BIOPSY;  Surgeon: Wilhelmenia Aloha Raddle., MD;  Location: [facility] ENDOSCOPY;  Service: Gastroenterology;;   ESOPHAGOGASTRODUODENOSCOPY (EGD) WITH PROPOFOL  N/A 12/17/2021   Procedure: ESOPHAGOGASTRODUODENOSCOPY (EGD) WITH PROPOFOL ;  Surgeon: Wilhelmenia Aloha Raddle., MD;  Location: [facility] ENDOSCOPY;  Service: Gastroenterology;  Laterality: N/A;   FISTULA SUPERFICIALIZATION Left 05/30/2018   Procedure: FISTULA PLICATION BASILIC VEIN TRANSPOSITION;  Surgeon: Eliza Lonni RAMAN, MD;  Location: [facility] OR;  Service: Vascular;  Laterality: Left;   FISTULA SUPERFICIALIZATION Left 12/18/2019   Procedure: PLICATION OF LEFT ARTERIOVENOUS FISTULA ULCER;  Surgeon: Oris Krystal FALCON, MD;  Location: [facility] OR;  Service: Vascular;  Laterality: Left;   I & D EXTREMITY Right 02/18/2021   Procedure: IRRIGATION AND DEBRIDEMENT OF ARM;  Surgeon: Eliza Lonni RAMAN, MD;  Location: [facility] OR;  Service: Vascular;  Laterality: Right;   INSERTION OF DIALYSIS CATHETER  N/A 05/19/2020   Procedure: TUNNELED INSERTION  OF DIALYSIS CATHETER;  Surgeon: Sheree Penne Bruckner, MD;  Location: [facility] OR;  Service: Vascular;  Laterality: N/A;   PERIPHERAL VASCULAR BALLOON ANGIOPLASTY  12/15/2023   Procedure: PERIPHERAL VASCULAR BALLOON ANGIOPLASTY;  Surgeon: Melia Lynwood ORN, MD;  Location: [facility] INVASIVE CV LAB;  Service: Cardiovascular;;   THROMBECTOMY AND REVISION  OF ARTERIOVENTOUS (AV) GORETEX  GRAFT Left 07/28/2020   Procedure: Oversewing of left arm Brachial cephalic fistula for bleeding.;  Surgeon: Eliza Bruckner RAMAN, MD;  Location: [facility] OR;  Service: Vascular;  Laterality: Left;   VENOGRAM Right 01/31/2014   Procedure: DIALYSIS CATHETER;  Surgeon: Gaile ORN New, MD;  Location: [facility] CATH LAB;  Service: Cardiovascular;  Laterality: Right;    Allergies  Allergen Reactions   Cephalosporins Rash and Other (See Comments)    To both keflex  and cefazolin .  Potential bullous lesion from Ceftriaxone  11/2023.   Tobramycin Sulfate Swelling    Eye swelling   Vancomycin  Swelling    Prior to Admission medications   Medication Sig Start Date End Date Taking? Authorizing Provider  amiodarone  (PACERONE ) 200 MG tablet Take 1 tablet (200 mg total) by mouth daily. 04/24/24   Ghimire, Kuber, MD  b complex-vitamin c-folic acid  (NEPHRO-VITE) 0.8 MG TABS tablet Take 1 tablet by mouth as needed (Supplement). Patient not taking: Reported on 05/31/2024    [provider]  Blood Glucose Monitoring Suppl DEVI 1 each by Does not apply route in the morning, at noon, and at bedtime. May substitute to any manufacturer covered by patient's insurance. 05/22/24   Rosario Leatrice FERNS, MD  Continuous Glucose Sensor (DEXCOM G5 MOB/G4 PLAT SENSOR) MISC Apply to the arm Patient not taking: Reported on 05/31/2024 05/22/24   Rosario Leatrice FERNS, MD  Continuous Glucose Transmitter (DEXCOM G5 MOBILE TRANSMITTER) MISC Continuous transmitter Patient not taking: Reported on 05/31/2024 05/22/24   Rosario Leatrice FERNS, MD  doxercalciferol  (HECTOROL ) 4 MCG/2ML injection Inject 0.5 mLs (1 mcg total) into the vein Every Tuesday,Thursday,and Saturday with dialysis. 05/23/24   Rosario Leatrice I, MD  Glucose Blood (BLOOD GLUCOSE TEST STRIPS) STRP 1 each by Other route in the morning, at noon, and at bedtime. May substitute to any manufacturer covered by patient's insurance. 05/31/24   Lorren Greig PARAS, NP   Menthol , Topical Analgesic, (BIOFREEZE) 10 % CREA Apply 1 Application topically daily as needed. 05/31/24   Lorren Greig PARAS, NP  midodrine  (PROAMATINE ) 10 MG tablet Take 1 tablet (10 mg total) by mouth 3 (three) times daily with meals. 05/12/24   Swayze, Ava, DO  sertraline  (ZOLOFT ) 25 MG tablet Take 1 tablet (25 mg total) by mouth daily. 05/31/24   Lorren Greig PARAS, NP  sevelamer  carbonate (RENVELA ) 800 MG tablet Take 4 tablets (3,200 mg total) by mouth 3 (three) times daily with meals. 05/31/24   Lorren Greig PARAS, NP  triamcinolone  (KENALOG ) 0.025 % cream Apply 1 Application topically 2 (two) times daily. 06/01/24   Lorren Greig PARAS, NP    Social History   Socioeconomic History   Marital status: Single    Spouse name: Not on file   Number of children: Not on file   Years of education: Not on file   Highest education level: Not on file  Occupational History   Not on file  Tobacco Use   Smoking status: Every Day    Current packs/day: 0.12    Average packs/day: 0.1 packs/day for 15.0 years (1.8 ttl pk-yrs)    Types: Cigarettes  Passive exposure: Current   Smokeless tobacco: Never   Tobacco comments:    4 cigarettes per day  Vaping Use   Vaping status: Never Used  Substance and Sexual Activity   Alcohol  use: Yes    Alcohol /week: 0.0 standard drinks of alcohol     Comment: Special Occasional takes Vicar   Drug use: Yes    Types: Marijuana    Comment: ocassional, last time- 12/21/20   Sexual activity: Not Currently    Birth control/protection: None  Other Topics Concern   Not on file  Social History Narrative   Not on file   Social Drivers of Health   Financial Resource Strain: Low Risk  (05/31/2024)   Overall Financial Resource Strain (CARDIA)    Difficulty of Paying Living Expenses: Not hard at all  Food Insecurity: No Food Insecurity (05/20/2024)   Hunger Vital Sign    Worried About Running Out of Food in the Last Year: Never true    Ran Out of Food in the Last Year: Never true   Transportation Needs: No Transportation Needs (05/20/2024)   PRAPARE - Administrator, Civil Service (Medical): No    Lack of Transportation (Non-Medical): No  Physical Activity: Inactive (05/31/2024)   Exercise Vital Sign    Days of Exercise per Week: 0 days    Minutes of Exercise per Session: 0 min  Stress: Stress Concern Present (05/31/2024)   [employer] of Occupational Health - Occupational Stress Questionnaire    Feeling of Stress : To some extent  Social Connections: Socially Isolated (05/31/2024)   Social Connection and Isolation Panel    Frequency of Communication with Friends and Family: Never    Frequency of Social Gatherings with Friends and Family: Never    Attends Religious Services: Never    Database administrator or Organizations: No    Attends Banker Meetings: Never    Marital Status: Never married  Intimate Partner Violence: Not At Risk (05/20/2024)   Humiliation, Afraid, Rape, and Kick questionnaire    Fear of Current or Ex-Partner: No    Emotionally Abused: No    Physically Abused: No    Sexually Abused: No     No family history on file.  ROS: Decreased flow on dialysis   Physical Examination  Vitals:   08/10/24 0911 08/10/24 0917  BP: (!) 106/57 101/60  Pulse: (!) 104 (!) 105  Resp: 12 18  Temp: 98.1 F (36.7 C)    There is no height or weight on file to calculate BMI.  Awake alert oriented Nonlabored respirations Right upper arm AV fistula is patent however there does appear to be decreased flow and somewhat pulsatility throughout the fistula, there is no palpable radial or ulnar pulse at the wrist  CBC    Component Value Date/Time   WBC 6.3 05/20/2024 0049   RBC 4.36 05/20/2024 0049   HGB 12.6 05/20/2024 0049   HCT 38.2 05/20/2024 0049   PLT 257 05/20/2024 0049   MCV 87.6 05/20/2024 0049   MCH 28.9 05/20/2024 0049   MCHC 33.0 05/20/2024 0049   RDW 19.6 (H) 05/20/2024 0049   LYMPHSABS 0.9 05/20/2024 0049    MONOABS 0.8 05/20/2024 0049   EOSABS 0.0 05/20/2024 0049   BASOSABS 0.0 05/20/2024 0049    BMET    Component Value Date/Time   NA 133 (L) 05/22/2024 0311   K 5.0 05/22/2024 0311   CL 95 (L) 05/22/2024 0311   CO2 27 05/22/2024 0311  GLUCOSE 103 (H) 05/22/2024 0311   BUN 55 (H) 05/22/2024 0311   CREATININE 4.85 (H) 05/22/2024 0311   CALCIUM  7.7 (L) 05/22/2024 0311   CALCIUM  6.7 (L) 01/31/2014 1309   GFRNONAA 11 (L) 05/22/2024 0311   GFRAA 9 (L) 08/02/2020 1049    COAGS: Lab Results  Component Value Date   INR 1.4 (H) 12/16/2023   INR 1.5 (H) 12/13/2023   INR 1.3 (H) 04/12/2022    ASSESSMENT/PLAN: This is a 43 y.o. female with end-stage renal disease here with malfunctioning right upper arm AV fistula with decreased flows on dialysis.  Plan for fistulogram today with possible intervention.  We discussed the risk benefits and alternatives and she demonstrates good understanding.  Kyheem Bathgate C. Sheree, MD [facility] [place] Office: [phone] Pager: [phone]

## 2024-08-11 ENCOUNTER — Encounter (HOSPITAL_COMMUNITY): Payer: Self-pay | Admitting: Vascular Surgery

## 2024-08-23 ENCOUNTER — Other Ambulatory Visit: Payer: Self-pay

## 2024-08-23 ENCOUNTER — Ambulatory Visit (HOSPITAL_COMMUNITY)
Admission: RE | Admit: 2024-08-23 | Discharge: 2024-08-23 | Disposition: A | Discharge status: Home / Self Care | Attending: Vascular Surgery | Admitting: Vascular Surgery

## 2024-08-23 ENCOUNTER — Encounter (HOSPITAL_COMMUNITY)
Admission: RE | Disposition: A | Discharge status: Home / Self Care | Payer: Self-pay | Source: Home / Self Care | Attending: Vascular Surgery

## 2024-08-23 DIAGNOSIS — I5022 Chronic systolic (congestive) heart failure: Secondary | ICD-10-CM | POA: Diagnosis not present

## 2024-08-23 DIAGNOSIS — F17210 Nicotine dependence, cigarettes, uncomplicated: Secondary | ICD-10-CM | POA: Diagnosis not present

## 2024-08-23 DIAGNOSIS — N186 End stage renal disease: Secondary | ICD-10-CM | POA: Insufficient documentation

## 2024-08-23 DIAGNOSIS — Y832 Surgical operation with anastomosis, bypass or graft as the cause of abnormal reaction of the patient, or of later complication, without mention of misadventure at the time of the procedure: Secondary | ICD-10-CM | POA: Diagnosis not present

## 2024-08-23 DIAGNOSIS — T82858A Stenosis of vascular prosthetic devices, implants and grafts, initial encounter: Secondary | ICD-10-CM | POA: Insufficient documentation

## 2024-08-23 DIAGNOSIS — Z992 Dependence on renal dialysis: Secondary | ICD-10-CM | POA: Diagnosis not present

## 2024-08-23 HISTORY — PX: VENOUS ANGIOPLASTY: CATH118376

## 2024-08-23 HISTORY — PX: A/V SHUNT INTERVENTION: CATH118220

## 2024-08-23 SURGERY — A/V SHUNT INTERVENTION
Anesthesia: LOCAL | Site: Arm Upper | Laterality: Right

## 2024-08-23 MED ORDER — LIDOCAINE HCL (PF) 1 % IJ SOLN
INTRAMUSCULAR | Status: DC | PRN
  Administered 2024-08-23: 5 mL

## 2024-08-23 MED ORDER — HEPARIN (PORCINE) IN NACL 1000-0.9 UT/500ML-% IV SOLN
INTRAVENOUS | Status: DC | PRN
  Administered 2024-08-23: 500 mL

## 2024-08-23 MED ORDER — LIDOCAINE HCL (PF) 1 % IJ SOLN
INTRAMUSCULAR | Status: AC
  Filled 2024-08-23: qty 30

## 2024-08-23 MED ORDER — IODIXANOL 320 MG/ML IV SOLN
INTRAVENOUS | Status: DC | PRN
  Administered 2024-08-23: 35 mL via INTRAVENOUS

## 2024-08-23 SURGICAL SUPPLY — 9 items
BALLOON MUSTANG 10.0X40 75 (BALLOONS) IMPLANT
BALLOON MUSTANG 10X60X75 (BALLOONS) IMPLANT
GUIDEWIRE ANGLED .035X150CM (WIRE) IMPLANT
KIT MICROPUNCTURE NIT STIFF (SHEATH) IMPLANT
SHEATH PINNACLE R/O II 6F 4CM (SHEATH) IMPLANT
SHEATH PROBE COVER 6X72 (BAG) IMPLANT
STOPCOCK MORSE 400PSI 3WAY (MISCELLANEOUS) IMPLANT
TRAY PV CATH (CUSTOM PROCEDURE TRAY) ×3 IMPLANT
TUBING CIL FLEX 10 FLL-RA (TUBING) IMPLANT

## 2024-08-23 NOTE — Op Note (Addendum)
    Patient name: [name] MRN: [id number] DOB: 12/23/1981 Sex: female  08/23/2024 Pre-operative Diagnosis: ESRD on HD Post-operative diagnosis:  Same Surgeon:  Norman GORMAN Serve, MD Procedure Performed:  Ultrasound-guided access of right arm AV fistula Fistulogram and central venogram Balloon angioplasty of fistula, venous anastomosis, 10 mm x 40 mm Mustang Balloon angioplasty of central lesion, innominate, 10 mm x 40 mm Mustang  Indications: [name] is a 43 year old female with ESRD presents to the HD access center for fistulogram.  She has been having issues with low pressures and low flows during sessions.  Her last session was yesterday.  She recently underwent fistulogram with balloon angioplasty of the venous anastomotic and a central lesion with Dr. Sheree about 2 weeks ago.  During that time she was noted to have a significantly calcified anastomosis and proximal fistula.  Risks and benefits of repeat fistulogram with intervention were reviewed and she elected to proceed.  Findings:  Significantly calcified anastomosis and proximal portion of fistula.  Approximate 70% stenosis of the venous anastomosis.  Approximate 90% stenosis of the right innominate vein.   Procedure:  The patient was identified in the holding area and taken to the cath lab  The patient was then placed supine on the table and prepped and draped in the usual sterile fashion.  A time out was called.  Ultrasound was used to evaluate the right arm AV access. This was accessed under u/s guidance. An 018 wire was advanced without resistance, a micropuncture sheath was placed and fistulagram obtained which demonstrated the above findings.  This access was then upsized to a 6 F short sheath over a glidewire.  A Glidewire was then used to cross the lesions listed above.  Both the venous anastomosis and the right innominate vein lesion were treated with a 10 mm x 40 mm Mustang balloon.  The venous anastomosis had  approximately a 20% residual stenosis and the innominate vein with a 40% residual stenosis.  This was much improved from prior and therefore the procedure was concluded.  The wire and sheath were removed and the access was managed with a 4 Monocryl figure-of-eight suture for hemostasis.  Contrast: 35 cc Sedation: None  Impression: Adequate result of balloon angioplasty of the venous anastomosis and right innominate vein. If she continues to have issues with dialysis and low flows she will need revision in the operating room during AV graft and should not be rescheduled for percutaneous intervention.   Norman GORMAN Serve MD [facility] [place] Office: [phone]

## 2024-08-23 NOTE — H&P (Signed)
 HD ACCESS CENTER H&P   Patient ID: [name], female   DOB: December 21, 1981, 43 y.o.   MRN: [id number]  Subjective:     HPI [name] is a 43 y.o. female with ESRD presenting to the HD access center for intervention.  Past Medical History:  Diagnosis Date   Anemia    low iron  - receives iron  at dialysis   Anxiety    Arthritis    RA   Atrial fibrillation with RVR (HCC) 12/14/2023   Chronic systolic congestive heart failure (HCC) 03/16/2016   Dyspnea    ESRD (end stage renal disease) (HCC)    [place] [facility] [place]    H/O pericarditis 01/17/2013   H/O pleural effusion 01/17/2013   Heart murmur    Lupus (systemic lupus erythematosus) (HCC)    Previously followed with Dr. 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 pneumonae bacteremia on 01/21/2013. Sensitive to Peniccilin    No family history on file. Past Surgical History:  Procedure Laterality Date   A/V FISTULAGRAM N/A 12/15/2023   Procedure: A/V Fistulagram;  Surgeon: Melia Lynwood ORN, MD;  Location: [facility] INVASIVE CV LAB;  Service: Cardiovascular;  Laterality: N/A;   A/V FISTULAGRAM Right 08/10/2024   Procedure: A/V Fistulagram;  Surgeon: Sheree Penne Bruckner, MD;  Location: [facility] PV LAB;  Service: Cardiovascular;  Laterality: Right;   AV FISTULA PLACEMENT     AV FISTULA PLACEMENT Right 08/01/2020   Procedure: RIGHT ARM BRACHIOCEPHALIC  ARTERIOVENOUS (AV) FISTULA CREATION;  Surgeon: Oris Krystal FALCON, MD;  Location: [facility] OR;  Service: Vascular;   Laterality: Right;   BASCILIC VEIN TRANSPOSITION Left 02/05/2014   Procedure: BASCILIC VEIN TRANSPOSITION;  Surgeon: Krystal FALCON Oris, MD;  Location: [facility] OR;  Service: Vascular;  Laterality: Left;   BASCILIC VEIN TRANSPOSITION Right 01/08/2021   Procedure: RIGHT ARM SECOND STAGE BASCILIC VEIN TRANSPOSITION;  Surgeon: Oris Krystal FALCON, MD;  Location: [facility] OR;  Service: Vascular;  Laterality: Right;   BIOPSY  12/17/2021   Procedure: BIOPSY;  Surgeon: Wilhelmenia Aloha Raddle., MD;  Location: [facility] ENDOSCOPY;  Service: Gastroenterology;;   ESOPHAGOGASTRODUODENOSCOPY (EGD) WITH PROPOFOL  N/A 12/17/2021   Procedure: ESOPHAGOGASTRODUODENOSCOPY (EGD) WITH PROPOFOL ;  Surgeon: Wilhelmenia Aloha Raddle., MD;  Location: [facility] ENDOSCOPY;  Service: Gastroenterology;  Laterality: N/A;   FISTULA SUPERFICIALIZATION Left 05/30/2018   Procedure: FISTULA PLICATION BASILIC VEIN TRANSPOSITION;  Surgeon: Eliza Bruckner RAMAN, MD;  Location: [facility] OR;  Service: Vascular;  Laterality: Left;   FISTULA SUPERFICIALIZATION Left 12/18/2019   Procedure: PLICATION OF LEFT ARTERIOVENOUS FISTULA ULCER;  Surgeon: Oris Krystal FALCON, MD;  Location: [facility] OR;  Service: Vascular;  Laterality: Left;   I & D EXTREMITY Right 02/18/2021   Procedure: IRRIGATION AND DEBRIDEMENT OF ARM;  Surgeon: Eliza Bruckner RAMAN, MD;  Location: [facility] OR;  Service: Vascular;  Laterality: Right;   INSERTION OF DIALYSIS CATHETER N/A 05/19/2020   Procedure: TUNNELED INSERTION  OF DIALYSIS CATHETER;  Surgeon: Sheree Penne Bruckner, MD;  Location: [facility] OR;  Service: Vascular;  Laterality: N/A;   PERIPHERAL VASCULAR BALLOON ANGIOPLASTY  12/15/2023   Procedure: PERIPHERAL VASCULAR BALLOON ANGIOPLASTY;  Surgeon: Melia Lynwood ORN, MD;  [facility] INVASIVE CV LAB;  Service: Cardiovascular;;   THROMBECTOMY AND REVISION OF ARTERIOVENTOUS (AV) GORETEX  GRAFT Left 07/28/2020   Procedure: Oversewing of left arm Brachial cephalic fistula for bleeding.;  Surgeon: Eliza Lonni RAMAN, MD;  Location: [facility] OR;   Service: Vascular;  Laterality: Left;   VENOGRAM Right 01/31/2014   Procedure: DIALYSIS CATHETER;  Surgeon: Gaile ORN New, MD;  Location: [facility] CATH LAB;  Service: Cardiovascular;  Laterality: Right;   VENOUS ANGIOPLASTY  08/10/2024   Procedure: VENOUS ANGIOPLASTY;  Surgeon: Sheree Penne Lonni, MD;  Location: [facility];  Service: Cardiovascular;;  svc and innominante    Short Social History:  Social History   Tobacco Use   Smoking status: Every Day    Current packs/day: 0.12    Average packs/day: 0.1 packs/day for 15.0 years (1.8 ttl pk-yrs)    Types: Cigarettes    Passive exposure: Current   Smokeless tobacco: Never   Tobacco comments:    4 cigarettes per day  Substance Use Topics   Alcohol  use: Yes    Alcohol /week: 0.0 standard drinks of alcohol     Comment: Special Occasional takes Vicar    Allergies  Allergen Reactions   Cephalosporins Rash and Other (See Comments)    To both keflex  and cefazolin .  Potential bullous lesion from Ceftriaxone  11/2023.   Tobramycin Sulfate Swelling    Eye swelling   Vancomycin  Swelling    No current facility-administered medications for this encounter.    REVIEW OF SYSTEMS All other systems were reviewed and are negative     Objective:   Objective   Vitals:   08/23/24 0844 08/23/24 0851  BP: (!) 96/51 (!) 96/51  Pulse: (!) 106   Resp: 12 16   There is no height or weight on file to calculate BMI.  Physical Exam General: no acute distress Cardiac: hemodynamically stable Extremities: Faint thrill in right upper extremity aVF  Data: Reviewed fistulogram from Dr. Sheree on 08/11/2023. Central stenoses were ballooned with an 8 mm balloon     Assessment/Plan:   [name] is a 43 y.o. female with ESRD presenting for fistulogram.  Having issues with low flows. Last HD session yesterday. Reviewed risks and benefits of fistula and patient agreed to proceed.   Norman Serve, MD [facility]

## 2024-08-24 ENCOUNTER — Encounter (HOSPITAL_COMMUNITY): Payer: Self-pay | Admitting: Vascular Surgery

## 2024-08-31 ENCOUNTER — Other Ambulatory Visit: Payer: Self-pay

## 2024-08-31 ENCOUNTER — Emergency Department (HOSPITAL_COMMUNITY)

## 2024-08-31 ENCOUNTER — Inpatient Hospital Stay (HOSPITAL_COMMUNITY)
Admission: EM | Admit: 2024-08-31 | Discharge: 2024-09-27 | DRG: 286 | Disposition: E | Attending: Internal Medicine | Admitting: Internal Medicine

## 2024-08-31 DIAGNOSIS — E43 Unspecified severe protein-calorie malnutrition: Secondary | ICD-10-CM | POA: Diagnosis present

## 2024-08-31 DIAGNOSIS — Z681 Body mass index (BMI) 19 or less, adult: Secondary | ICD-10-CM

## 2024-08-31 DIAGNOSIS — E162 Hypoglycemia, unspecified: Secondary | ICD-10-CM | POA: Diagnosis not present

## 2024-08-31 DIAGNOSIS — Z1152 Encounter for screening for COVID-19: Secondary | ICD-10-CM | POA: Diagnosis not present

## 2024-08-31 DIAGNOSIS — I083 Combined rheumatic disorders of mitral, aortic and tricuspid valves: Secondary | ICD-10-CM | POA: Diagnosis present

## 2024-08-31 DIAGNOSIS — T82838A Hemorrhage of vascular prosthetic devices, implants and grafts, initial encounter: Secondary | ICD-10-CM | POA: Diagnosis not present

## 2024-08-31 DIAGNOSIS — Z7189 Other specified counseling: Secondary | ICD-10-CM | POA: Diagnosis not present

## 2024-08-31 DIAGNOSIS — F05 Delirium due to known physiological condition: Secondary | ICD-10-CM | POA: Diagnosis not present

## 2024-08-31 DIAGNOSIS — G9341 Metabolic encephalopathy: Secondary | ICD-10-CM | POA: Diagnosis not present

## 2024-08-31 DIAGNOSIS — D631 Anemia in chronic kidney disease: Secondary | ICD-10-CM | POA: Diagnosis present

## 2024-08-31 DIAGNOSIS — T380X5A Adverse effect of glucocorticoids and synthetic analogues, initial encounter: Secondary | ICD-10-CM | POA: Diagnosis not present

## 2024-08-31 DIAGNOSIS — N186 End stage renal disease: Secondary | ICD-10-CM | POA: Diagnosis present

## 2024-08-31 DIAGNOSIS — E8770 Fluid overload, unspecified: Secondary | ICD-10-CM | POA: Diagnosis present

## 2024-08-31 DIAGNOSIS — E11649 Type 2 diabetes mellitus with hypoglycemia without coma: Secondary | ICD-10-CM | POA: Diagnosis not present

## 2024-08-31 DIAGNOSIS — I953 Hypotension of hemodialysis: Secondary | ICD-10-CM | POA: Diagnosis not present

## 2024-08-31 DIAGNOSIS — I4811 Longstanding persistent atrial fibrillation: Secondary | ICD-10-CM | POA: Diagnosis present

## 2024-08-31 DIAGNOSIS — E875 Hyperkalemia: Secondary | ICD-10-CM

## 2024-08-31 DIAGNOSIS — J9610 Chronic respiratory failure, unspecified whether with hypoxia or hypercapnia: Secondary | ICD-10-CM | POA: Diagnosis not present

## 2024-08-31 DIAGNOSIS — J810 Acute pulmonary edema: Secondary | ICD-10-CM | POA: Diagnosis not present

## 2024-08-31 DIAGNOSIS — I483 Typical atrial flutter: Secondary | ICD-10-CM | POA: Diagnosis not present

## 2024-08-31 DIAGNOSIS — Y841 Kidney dialysis as the cause of abnormal reaction of the patient, or of later complication, without mention of misadventure at the time of the procedure: Secondary | ICD-10-CM | POA: Diagnosis not present

## 2024-08-31 DIAGNOSIS — M87822 Other osteonecrosis, left humerus: Secondary | ICD-10-CM | POA: Diagnosis present

## 2024-08-31 DIAGNOSIS — F17210 Nicotine dependence, cigarettes, uncomplicated: Secondary | ICD-10-CM | POA: Diagnosis present

## 2024-08-31 DIAGNOSIS — Z79899 Other long term (current) drug therapy: Secondary | ICD-10-CM

## 2024-08-31 DIAGNOSIS — F419 Anxiety disorder, unspecified: Secondary | ICD-10-CM | POA: Diagnosis present

## 2024-08-31 DIAGNOSIS — R7881 Bacteremia: Secondary | ICD-10-CM | POA: Diagnosis present

## 2024-08-31 DIAGNOSIS — G9349 Other encephalopathy: Secondary | ICD-10-CM | POA: Diagnosis not present

## 2024-08-31 DIAGNOSIS — E1122 Type 2 diabetes mellitus with diabetic chronic kidney disease: Secondary | ICD-10-CM | POA: Diagnosis present

## 2024-08-31 DIAGNOSIS — R296 Repeated falls: Secondary | ICD-10-CM | POA: Diagnosis present

## 2024-08-31 DIAGNOSIS — Z881 Allergy status to other antibiotic agents status: Secondary | ICD-10-CM

## 2024-08-31 DIAGNOSIS — M3214 Glomerular disease in systemic lupus erythematosus: Secondary | ICD-10-CM | POA: Diagnosis present

## 2024-08-31 DIAGNOSIS — E16A1 Hypoglycemia level 1: Secondary | ICD-10-CM | POA: Diagnosis present

## 2024-08-31 DIAGNOSIS — G8929 Other chronic pain: Secondary | ICD-10-CM | POA: Diagnosis present

## 2024-08-31 DIAGNOSIS — Z789 Other specified health status: Secondary | ICD-10-CM | POA: Diagnosis not present

## 2024-08-31 DIAGNOSIS — I132 Hypertensive heart and chronic kidney disease with heart failure and with stage 5 chronic kidney disease, or end stage renal disease: Secondary | ICD-10-CM | POA: Diagnosis present

## 2024-08-31 DIAGNOSIS — I959 Hypotension, unspecified: Secondary | ICD-10-CM | POA: Diagnosis not present

## 2024-08-31 DIAGNOSIS — I4892 Unspecified atrial flutter: Secondary | ICD-10-CM | POA: Diagnosis present

## 2024-08-31 DIAGNOSIS — N2581 Secondary hyperparathyroidism of renal origin: Secondary | ICD-10-CM | POA: Diagnosis present

## 2024-08-31 DIAGNOSIS — R000 Tachycardia, unspecified: Secondary | ICD-10-CM | POA: Diagnosis present

## 2024-08-31 DIAGNOSIS — J9600 Acute respiratory failure, unspecified whether with hypoxia or hypercapnia: Secondary | ICD-10-CM | POA: Diagnosis not present

## 2024-08-31 DIAGNOSIS — T8241XA Breakdown (mechanical) of vascular dialysis catheter, initial encounter: Secondary | ICD-10-CM | POA: Diagnosis not present

## 2024-08-31 DIAGNOSIS — I5023 Acute on chronic systolic (congestive) heart failure: Secondary | ICD-10-CM | POA: Diagnosis not present

## 2024-08-31 DIAGNOSIS — R0902 Hypoxemia: Secondary | ICD-10-CM | POA: Diagnosis not present

## 2024-08-31 DIAGNOSIS — E872 Acidosis, unspecified: Secondary | ICD-10-CM | POA: Diagnosis present

## 2024-08-31 DIAGNOSIS — I5020 Unspecified systolic (congestive) heart failure: Secondary | ICD-10-CM | POA: Diagnosis not present

## 2024-08-31 DIAGNOSIS — M329 Systemic lupus erythematosus, unspecified: Secondary | ICD-10-CM | POA: Diagnosis present

## 2024-08-31 DIAGNOSIS — I429 Cardiomyopathy, unspecified: Secondary | ICD-10-CM | POA: Diagnosis present

## 2024-08-31 DIAGNOSIS — I9589 Other hypotension: Secondary | ICD-10-CM | POA: Diagnosis present

## 2024-08-31 DIAGNOSIS — I5043 Acute on chronic combined systolic (congestive) and diastolic (congestive) heart failure: Secondary | ICD-10-CM | POA: Diagnosis present

## 2024-08-31 DIAGNOSIS — F1420 Cocaine dependence, uncomplicated: Secondary | ICD-10-CM | POA: Diagnosis present

## 2024-08-31 DIAGNOSIS — I493 Ventricular premature depolarization: Secondary | ICD-10-CM | POA: Diagnosis not present

## 2024-08-31 DIAGNOSIS — Z66 Do not resuscitate: Secondary | ICD-10-CM | POA: Diagnosis not present

## 2024-08-31 DIAGNOSIS — R64 Cachexia: Secondary | ICD-10-CM | POA: Diagnosis present

## 2024-08-31 DIAGNOSIS — R627 Adult failure to thrive: Secondary | ICD-10-CM | POA: Diagnosis present

## 2024-08-31 DIAGNOSIS — Z888 Allergy status to other drugs, medicaments and biological substances status: Secondary | ICD-10-CM

## 2024-08-31 DIAGNOSIS — I5082 Biventricular heart failure: Secondary | ICD-10-CM | POA: Diagnosis not present

## 2024-08-31 DIAGNOSIS — I2729 Other secondary pulmonary hypertension: Secondary | ICD-10-CM | POA: Diagnosis present

## 2024-08-31 DIAGNOSIS — R634 Abnormal weight loss: Secondary | ICD-10-CM | POA: Diagnosis not present

## 2024-08-31 DIAGNOSIS — R570 Cardiogenic shock: Secondary | ICD-10-CM | POA: Diagnosis present

## 2024-08-31 DIAGNOSIS — I484 Atypical atrial flutter: Secondary | ICD-10-CM | POA: Diagnosis not present

## 2024-08-31 DIAGNOSIS — I4891 Unspecified atrial fibrillation: Secondary | ICD-10-CM | POA: Diagnosis not present

## 2024-08-31 DIAGNOSIS — J9601 Acute respiratory failure with hypoxia: Secondary | ICD-10-CM | POA: Diagnosis present

## 2024-08-31 DIAGNOSIS — Z515 Encounter for palliative care: Secondary | ICD-10-CM

## 2024-08-31 DIAGNOSIS — Z723 Lack of physical exercise: Secondary | ICD-10-CM

## 2024-08-31 DIAGNOSIS — L89302 Pressure ulcer of unspecified buttock, stage 2: Secondary | ICD-10-CM | POA: Diagnosis present

## 2024-08-31 DIAGNOSIS — M87821 Other osteonecrosis, right humerus: Secondary | ICD-10-CM | POA: Diagnosis present

## 2024-08-31 DIAGNOSIS — D696 Thrombocytopenia, unspecified: Secondary | ICD-10-CM | POA: Diagnosis not present

## 2024-08-31 DIAGNOSIS — R54 Age-related physical debility: Secondary | ICD-10-CM | POA: Diagnosis present

## 2024-08-31 DIAGNOSIS — I50812 Chronic right heart failure: Secondary | ICD-10-CM | POA: Diagnosis not present

## 2024-08-31 DIAGNOSIS — E8729 Other acidosis: Secondary | ICD-10-CM | POA: Diagnosis not present

## 2024-08-31 DIAGNOSIS — Z992 Dependence on renal dialysis: Secondary | ICD-10-CM

## 2024-08-31 DIAGNOSIS — I5084 End stage heart failure: Secondary | ICD-10-CM | POA: Diagnosis present

## 2024-08-31 DIAGNOSIS — Z604 Social exclusion and rejection: Secondary | ICD-10-CM | POA: Diagnosis present

## 2024-08-31 DIAGNOSIS — E871 Hypo-osmolality and hyponatremia: Secondary | ICD-10-CM | POA: Diagnosis present

## 2024-08-31 DIAGNOSIS — G4700 Insomnia, unspecified: Secondary | ICD-10-CM | POA: Diagnosis present

## 2024-08-31 DIAGNOSIS — R4589 Other symptoms and signs involving emotional state: Secondary | ICD-10-CM | POA: Diagnosis not present

## 2024-08-31 DIAGNOSIS — E861 Hypovolemia: Secondary | ICD-10-CM | POA: Diagnosis present

## 2024-08-31 LAB — RESP PANEL BY RT-PCR (RSV, FLU A&B, COVID)  RVPGX2
Influenza A by PCR: NEGATIVE
Influenza B by PCR: NEGATIVE
Resp Syncytial Virus by PCR: NEGATIVE
SARS Coronavirus 2 by RT PCR: NEGATIVE

## 2024-08-31 LAB — RESPIRATORY PANEL BY PCR

## 2024-08-31 LAB — CBC WITH DIFFERENTIAL/PLATELET
Abs Immature Granulocytes: 0.09 K/uL — ABNORMAL HIGH (ref 0.00–0.07)
Basophils Absolute: 0 K/uL (ref 0.0–0.1)
Basophils Relative: 0 %
Eosinophils Absolute: 0.1 K/uL (ref 0.0–0.5)
Eosinophils Relative: 1 %
HCT: 43.1 % (ref 36.0–46.0)
Hemoglobin: 13.6 g/dL (ref 12.0–15.0)
Immature Granulocytes: 1 %
Lymphocytes Relative: 22 %
Lymphs Abs: 1.9 K/uL (ref 0.7–4.0)
MCH: 27.8 pg (ref 26.0–34.0)
MCHC: 31.6 g/dL (ref 30.0–36.0)
MCV: 88 fL (ref 80.0–100.0)
Monocytes Absolute: 1.1 K/uL — ABNORMAL HIGH (ref 0.1–1.0)
Monocytes Relative: 12 %
Neutro Abs: 5.6 K/uL (ref 1.7–7.7)
Neutrophils Relative %: 64 %
Platelets: 191 K/uL (ref 150–400)
RBC: 4.9 MIL/uL (ref 3.87–5.11)
RDW: 16.9 % — ABNORMAL HIGH (ref 11.5–15.5)
WBC: 8.7 K/uL (ref 4.0–10.5)
nRBC: 0.2 % (ref 0.0–0.2)

## 2024-08-31 LAB — BASIC METABOLIC PANEL WITH GFR
Anion gap: 25 — ABNORMAL HIGH (ref 5–15)
BUN: 49 mg/dL — ABNORMAL HIGH (ref 6–20)
CO2: 18 mmol/L — ABNORMAL LOW (ref 22–32)
Calcium: 9.5 mg/dL (ref 8.9–10.3)
Chloride: 91 mmol/L — ABNORMAL LOW (ref 98–111)
Creatinine, Ser: 7.17 mg/dL — ABNORMAL HIGH (ref 0.44–1.00)
GFR, Estimated: 7 mL/min — ABNORMAL LOW (ref >60)
Glucose, Bld: 42 mg/dL — CL (ref 70–99)
Potassium: 5.5 mmol/L — ABNORMAL HIGH (ref 3.5–5.1)
Sodium: 134 mmol/L — ABNORMAL LOW (ref 135–145)

## 2024-08-31 LAB — LACTIC ACID, PLASMA
Lactic Acid, Venous: 4.9 mmol/L (ref 0.5–1.9)
Lactic Acid, Venous: 3.2 mmol/L (ref 0.5–1.9)
Lactic Acid, Venous: 4.5 mmol/L (ref 0.5–1.9)

## 2024-08-31 LAB — POCT I-STAT 7, (LYTES, BLD GAS, ICA,H+H)
Acid-Base Excess: 5 mmol/L — ABNORMAL HIGH (ref 0.0–2.0)
Bicarbonate: 27.8 mmol/L (ref 20.0–28.0)
Calcium, Ion: 1.13 mmol/L — ABNORMAL LOW (ref 1.15–1.40)
HCT: 37 % (ref 36.0–46.0)
Hemoglobin: 12.6 g/dL (ref 12.0–15.0)
O2 Saturation: 100 %
Patient temperature: 98.6
Potassium: 3.8 mmol/L (ref 3.5–5.1)
Sodium: 132 mmol/L — ABNORMAL LOW (ref 135–145)
TCO2: 29 mmol/L (ref 22–32)
pCO2 arterial: 32.4 mmHg (ref 32–48)
pH, Arterial: 7.541 — ABNORMAL HIGH (ref 7.35–7.45)
pO2, Arterial: 231 mmHg — ABNORMAL HIGH (ref 83–108)

## 2024-08-31 LAB — GLUCOSE, CAPILLARY
Glucose-Capillary: 153 mg/dL — ABNORMAL HIGH (ref 70–99)
Glucose-Capillary: 124 mg/dL — ABNORMAL HIGH (ref 70–99)
Glucose-Capillary: 91 mg/dL (ref 70–99)
Glucose-Capillary: 53 mg/dL — ABNORMAL LOW (ref 70–99)
Glucose-Capillary: 38 mg/dL — CL (ref 70–99)
Glucose-Capillary: 52 mg/dL — ABNORMAL LOW (ref 70–99)
Glucose-Capillary: 71 mg/dL (ref 70–99)
Glucose-Capillary: 241 mg/dL — ABNORMAL HIGH (ref 70–99)
Glucose-Capillary: 194 mg/dL — ABNORMAL HIGH (ref 70–99)

## 2024-08-31 LAB — I-STAT CHEM 8, ED
BUN: 55 mg/dL — ABNORMAL HIGH (ref 6–20)
Calcium, Ion: 0.85 mmol/L — CL (ref 1.15–1.40)
Chloride: 101 mmol/L (ref 98–111)
Creatinine, Ser: 7.2 mg/dL — ABNORMAL HIGH (ref 0.44–1.00)
Glucose, Bld: 41 mg/dL — CL (ref 70–99)
HCT: 47 % — ABNORMAL HIGH (ref 36.0–46.0)
Hemoglobin: 16 g/dL — ABNORMAL HIGH (ref 12.0–15.0)
Potassium: 5.3 mmol/L — ABNORMAL HIGH (ref 3.5–5.1)
Sodium: 129 mmol/L — ABNORMAL LOW (ref 135–145)
TCO2: 19 mmol/L — ABNORMAL LOW (ref 22–32)

## 2024-08-31 LAB — CBG MONITORING, ED
Glucose-Capillary: 33 mg/dL — CL (ref 70–99)
Glucose-Capillary: 35 mg/dL — CL (ref 70–99)
Glucose-Capillary: <10 mg/dL — CL (ref 70–99)
Glucose-Capillary: 157 mg/dL — ABNORMAL HIGH (ref 70–99)
Glucose-Capillary: 166 mg/dL — ABNORMAL HIGH (ref 70–99)

## 2024-08-31 LAB — CORTISOL: Cortisol, Plasma: 15.6 ug/dL

## 2024-08-31 LAB — BRAIN NATRIURETIC PEPTIDE: B Natriuretic Peptide: >4500 pg/mL — ABNORMAL HIGH (ref 0.0–100.0)

## 2024-08-31 LAB — CREATININE, SERUM
Creatinine, Ser: 7.72 mg/dL — ABNORMAL HIGH (ref 0.44–1.00)
GFR, Estimated: 6 mL/min — ABNORMAL LOW (ref >60)

## 2024-08-31 LAB — MRSA NEXT GEN BY PCR, NASAL: MRSA by PCR Next Gen: DETECTED — AB

## 2024-08-31 LAB — HEPATITIS B SURFACE ANTIGEN: Hepatitis B Surface Ag: NONREACTIVE

## 2024-08-31 LAB — I-STAT CG4 LACTIC ACID, ED: Lactic Acid, Venous: 6.2 mmol/L (ref 0.5–1.9)

## 2024-08-31 MED ORDER — CHLORHEXIDINE GLUCONATE CLOTH 2 % EX PADS
6.0000 | MEDICATED_PAD | Freq: Every day | CUTANEOUS | Status: DC
  Administered 2024-08-31 – 2024-09-01 (×2): 6 via TOPICAL

## 2024-08-31 MED ORDER — DEXTROSE 50 % IV SOLN: 1.0000 | Freq: Once | INTRAVENOUS | Status: AC

## 2024-08-31 MED ORDER — MIDODRINE HCL 5 MG PO TABS
10.0000 mg | ORAL_TABLET | Freq: Three times a day (TID) | ORAL | Status: DC
  Administered 2024-08-31 – 2024-09-10 (×28): 10 mg via ORAL
  Filled 2024-08-31 (×33): qty 2

## 2024-08-31 MED ORDER — AMIODARONE HCL 200 MG PO TABS
200.0000 mg | ORAL_TABLET | Freq: Every day | ORAL | Status: DC
  Administered 2024-08-31 – 2024-09-01 (×2): 200 mg via ORAL
  Filled 2024-08-31 (×2): qty 1

## 2024-08-31 MED ORDER — SODIUM CHLORIDE 0.9 % IV BOLUS
250.0000 mL | Freq: Once | INTRAVENOUS | Status: AC
  Administered 2024-08-31: 250 mL via INTRAVENOUS

## 2024-08-31 MED ORDER — DEXTROSE 50 % IV SOLN
INTRAVENOUS | Status: AC
  Filled 2024-08-31: qty 50

## 2024-08-31 MED ORDER — DEXTROSE 50 % IV SOLN
25.0000 g | INTRAVENOUS | Status: AC
  Administered 2024-08-31: 25 g via INTRAVENOUS

## 2024-08-31 MED ORDER — HEPARIN SODIUM (PORCINE) 5000 UNIT/ML IJ SOLN
5000.0000 [IU] | Freq: Three times a day (TID) | INTRAMUSCULAR | Status: DC
  Administered 2024-08-31 – 2024-09-08 (×20): 5000 [IU] via SUBCUTANEOUS
  Filled 2024-08-31 (×23): qty 1

## 2024-08-31 MED ORDER — DOCUSATE SODIUM 100 MG PO CAPS: 100.0000 mg | ORAL_CAPSULE | Freq: Two times a day (BID) | ORAL | Status: DC | PRN

## 2024-08-31 MED ORDER — METOPROLOL SUCCINATE 12.5 MG HALF TABLET
12.5000 mg | ORAL_TABLET | Freq: Every day | ORAL | Status: DC
  Filled 2024-08-31: qty 1

## 2024-08-31 MED ORDER — METOPROLOL SUCCINATE 12.5 MG HALF TABLET: 12.5000 mg | ORAL_TABLET | ORAL | Status: DC | PRN

## 2024-08-31 MED ORDER — POLYETHYLENE GLYCOL 3350 17 G PO PACK: 17.0000 g | PACK | Freq: Every day | ORAL | Status: DC | PRN

## 2024-08-31 MED ORDER — ACETAMINOPHEN 325 MG PO TABS
650.0000 mg | ORAL_TABLET | Freq: Four times a day (QID) | ORAL | Status: DC | PRN
  Administered 2024-08-31 – 2024-09-09 (×14): 650 mg via ORAL
  Filled 2024-08-31 (×13): qty 2

## 2024-08-31 MED ORDER — SODIUM CHLORIDE 0.9 % IV SOLN: 250.0000 mL | INTRAVENOUS | Status: AC

## 2024-08-31 MED ORDER — NOREPINEPHRINE 4 MG/250ML-% IV SOLN
0.0000 ug/min | INTRAVENOUS | Status: DC
  Administered 2024-09-02: 2 ug/min via INTRAVENOUS
  Administered 2024-09-03: 4 ug/min via INTRAVENOUS
  Filled 2024-08-31 (×2): qty 250

## 2024-08-31 MED ORDER — DEXTROSE 50 % IV SOLN
50.0000 mL | Freq: Once | INTRAVENOUS | Status: AC
  Administered 2024-08-31: 50 mL via INTRAVENOUS
  Filled 2024-08-31: qty 50

## 2024-08-31 MED ORDER — ALBUMIN HUMAN 25 % IV SOLN
25.0000 g | INTRAVENOUS | Status: AC | PRN
  Administered 2024-08-31: 25 g via INTRAVENOUS
  Filled 2024-08-31 (×2): qty 100

## 2024-08-31 MED ORDER — GLUCAGON HCL RDNA (DIAGNOSTIC) 1 MG IJ SOLR
INTRAMUSCULAR | Status: AC
  Filled 2024-08-31: qty 1

## 2024-08-31 MED ORDER — DEXTROSE 50 % IV SOLN
INTRAVENOUS | Status: AC
  Administered 2024-08-31: 50 mL via INTRAVENOUS
  Filled 2024-08-31: qty 50

## 2024-08-31 MED ORDER — LEVOFLOXACIN IN D5W 250 MG/50ML IV SOLN: 250.0000 mg | INTRAVENOUS | Status: DC

## 2024-08-31 MED ORDER — ALBUMIN HUMAN 25 % IV SOLN
25.0000 g | Freq: Once | INTRAVENOUS | Status: AC
  Administered 2024-08-31: 25 g via INTRAVENOUS
  Filled 2024-08-31: qty 100

## 2024-08-31 MED ORDER — LEVOFLOXACIN IN D5W 750 MG/150ML IV SOLN
750.0000 mg | Freq: Once | INTRAVENOUS | Status: AC
  Administered 2024-08-31: 750 mg via INTRAVENOUS
  Filled 2024-08-31: qty 150

## 2024-08-31 MED ORDER — DEXTROSE 10 % IV SOLN
INTRAVENOUS | Status: DC
  Filled 2024-08-31 (×2): qty 1000

## 2024-08-31 MED ORDER — NEPRO/CARBSTEADY PO LIQD
237.0000 mL | Freq: Two times a day (BID) | ORAL | Status: DC
  Administered 2024-08-31 – 2024-09-01 (×2): 237 mL via ORAL

## 2024-08-31 MED ORDER — GLUCAGON HCL RDNA (DIAGNOSTIC) 1 MG IJ SOLR
1.0000 mg | Freq: Once | INTRAMUSCULAR | Status: AC
  Administered 2024-08-31: 1 mg via INTRAMUSCULAR
  Filled 2024-08-31: qty 1

## 2024-08-31 NOTE — H&P (Addendum)
 NAME:  [name], MRN:  [id number], DOB:  1981-04-28, LOS: 0 ADMISSION DATE:  08/31/2024, CONSULTATION DATE:  08/31/24 REFERRING MD:  Ula , CHIEF COMPLAINT:  SOB    History of Present Illness:  43 yo F PMH lupus nephritis, ESRD on HD, chronic hypotension, hx klebsiella bacteremia, Afib/flutter not on AC, prior SDH, HFrEF and RV failure, Severe aortic regurg, mod-sev tricuspid regurg, moderate mitral regurg, avascular necrosis humoral heads, severe kcal protein malnutrition  who presented to ED 08/31/24 w CC SOB, started >1week prior to ED presentation. Associated DOE and poor appetite/poor intake. No n/v/d. No cough, URI sx, no sick contacts, not around kids. On T/R/Sa HD -- states she went on Tuesday. Workup in ED c/f volume overload w CXR c/f edema dn BNP >4500. Getting a dose of levaquin  for possible PNA.    Soft BP in ED. Did not take her midodrine  this morning   In chart review looks like she has had ongoing wt loss   Pertinent  Medical History  Lupus nephritis ESRD on HD Chronic hypotension Avascular necrosis humeral heads  SDH Afib/flutter HFrEF RV failure Mitral regurg, Aortic regurg, Tricuspid regurg  Bacteremia Severe protein calorie malnutrition   Significant Hospital Events: Including procedures, antibiotic start and stop dates in addition to other pertinent events   9/4 admit to ICU, volume overload   Interim History / Subjective:  Given a dose of abx for possible PNA as well as midodrine  which she did not take at home  Has blows PIVs in ED. Appreciate EMP placing an US  guided PIV  Objective    Blood pressure (!) 65/57, pulse (!) 118, temperature 97.8 F (36.6 C), temperature source Oral, resp. rate (!) 24, height 5' 2 (1.575 m), weight 38 kg, SpO2 100%.       No intake or output data in the 24 hours ending 08/31/24 1141 Filed Weights   08/31/24 0722  Weight: 38 kg    Examination: General: Chronically and acutely ill cachectic F, appears older than stated  age  HENT: NCAT  Lungs: incr RR, shallow respirations, bilat crackles.  Cardiovascular: tachycardic. Cardiac sounds are diminished by respiratory sounds  Abdomen: thin soft  Extremities: muscle wasting. No obvious acute appearing joint deformity  Neuro: AAOx4 generalized weakness without focal deficit  GU: defer   Resolved problem list   Assessment and Plan   Acute hypoxic respiratory failure (vs AoC hypoxic resp failure)  Pulmonary edema Possible CAP  -rcvd dose of levaquin  in ED  -looks like previously dc w O2, says not using.  P -plan for HD today for volume removal  -will send RVP  -with her renal dz, think PCT would be low yield  -if ongoing resp sx after volume off today, will cont abx   AoC hypotension vs shock  Lactic acidosis -meets sepsis criteria but not sure if she is septic or not. Has rcvd abx in ED with this in mind, possible PNA -poor PO intake for days but clinically suggestive of overload  -could have decomp HF  -no ACS sx  P -Home midodrine , which she did not take, has been resumed  -Bcx -RVP, legionella, strep pneumo  -trend LA  -d/w pt may end up needing central access, defer at present as firm indication.  -If needs a line, will order coox   AoC HFmrEF, RV failure Mitral regurg Tricuspid regurg Aortic regurg  Hx Afib/flutter not on AC (hx SDH)  -BNP undetectably high on admission  P -cont home  amio -consider repeat ECHO this admission  -no AC   ESRD  Lupus nephritis  AGMA Hyperkalemia Hypocalcemia  P -nephro following, appreciate coordinating HD plans   IDDM with hypoglycemia  P -q2 CBG for now,  correct hypogly as needed. Space out to q4 when more stable values  -add SSI when clinically appropriate   Severe protein calorie malnutrition Unintentional wt loss Physical deconditioning, generalized wknss  P -RDN consult this admission -PT/OT when appropriate   GOC -in the past hospice has been recommended. I see in her hospital  f/u note 05/2024 that she was not interested in considering this -I do worry about her overall trajectory, but on admission 9/4 she endorses wanting full scope of offered care, full code.    Labs   CBC: Recent Labs  Lab 08/31/24 0725 08/31/24 0753  WBC 8.7  --   NEUTROABS 5.6  --   HGB 13.6 16.0*  HCT 43.1 47.0*  MCV 88.0  --   PLT 191  --     Basic Metabolic Panel: Recent Labs  Lab 08/31/24 0725 08/31/24 0753  NA 134* 129*  K 5.5* 5.3*  CL 91* 101  CO2 18*  --   GLUCOSE 42* 41*  BUN 49* 55*  CREATININE 7.17* 7.20*  CALCIUM  9.5  --    GFR: Estimated Creatinine Clearance: 6.2 mL/min (A) (by C-G formula based on SCr of 7.2 mg/dL (H)). Recent Labs  Lab 08/31/24 0725 08/31/24 0753  WBC 8.7  --   LATICACIDVEN  --  6.2*    Liver Function Tests: No results for input(s): AST, ALT, ALKPHOS, BILITOT, PROT, ALBUMIN  in the last 168 hours. No results for input(s): LIPASE, AMYLASE in the last 168 hours. No results for input(s): AMMONIA in the last 168 hours.  ABG    Component Value Date/Time   PHART 7.336 (L) 12/01/2022 1025   PCO2ART 39.9 12/01/2022 1025   PO2ART 399 (H) 12/01/2022 1025   HCO3 18.9 (L) 05/18/2024 2216   TCO2 19 (L) 08/31/2024 0753   ACIDBASEDEF 7.0 (H) 05/18/2024 2216   O2SAT 66 05/18/2024 2216     Coagulation Profile: No results for input(s): INR, PROTIME in the last 168 hours.  Cardiac Enzymes: No results for input(s): CKTOTAL, CKMB, CKMBINDEX, TROPONINI in the last 168 hours.  HbA1C: Hgb A1c MFr Bld  Date/Time Value Ref Range Status  04/19/2024 01:44 PM 6.1 (H) 4.8 - 5.6 % Final    Comment:    (NOTE) Pre diabetes:          5.7%-6.4%  Diabetes:              >6.4%  Glycemic control for   <7.0% adults with diabetes   12/18/2023 02:14 AM 5.4 4.8 - 5.6 % Final    Comment:    (NOTE) Pre diabetes:          5.7%-6.4%  Diabetes:              >6.4%  Glycemic control for   <7.0% adults with diabetes      CBG: Recent Labs  Lab 08/31/24 0840 08/31/24 0906 08/31/24 0921 08/31/24 1000 08/31/24 1121  GLUCAP <10* 33* 35* 157* 166*    Review of Systems:   Review of Systems  Constitutional:  Positive for malaise/fatigue. Negative for diaphoresis and fever.  HENT:  Negative for congestion, sinus pain and sore throat.   Respiratory:  Positive for shortness of breath. Negative for cough, hemoptysis, sputum production and wheezing.   Cardiovascular:  Negative for chest pain, palpitations and leg swelling.  Gastrointestinal:  Negative for abdominal pain, constipation, diarrhea and vomiting.  Skin: Negative.   Neurological:  Positive for weakness.     Past Medical History:  She,  has a past medical history of Anemia, Anxiety, Arthritis, Atrial fibrillation with RVR (HCC) (12/14/2023), Chronic systolic congestive heart failure (HCC) (03/16/2016), Dyspnea, ESRD (end stage renal disease) (HCC), H/O pericarditis (01/17/2013), H/O pleural effusion (01/17/2013), Heart murmur, Lupus (systemic lupus erythematosus) (HCC), Lupus nephritis (HCC) (2006), Pneumonia, Polysubstance abuse (HCC), S/P pericardiocentesis (01/17/2013), Seizures (HCC), Septic shock (HCC) (12/13/2023), and Streptococcal bacteremia (01/23/2013).   Surgical History:   Past Surgical History:  Procedure Laterality Date   A/V FISTULAGRAM N/A 12/15/2023   Procedure: A/V Fistulagram;  Surgeon: Melia Lynwood ORN, MD;  Location: [facility] INVASIVE CV LAB;  Service: Cardiovascular;  Laterality: N/A;   A/V FISTULAGRAM Right 08/10/2024   Procedure: A/V Fistulagram;  Surgeon: Sheree Penne Bruckner, MD;  Location: [facility];  Service: Cardiovascular;  Laterality: Right;   A/V SHUNT INTERVENTION Right 08/23/2024   Procedure: A/V SHUNT INTERVENTION;  Surgeon: Pearline Norman RAMAN, MD;  Location: [facility];  Service: Cardiovascular;  Laterality: Right;   AV FISTULA PLACEMENT     AV FISTULA PLACEMENT Right 08/01/2020   Procedure: RIGHT ARM BRACHIOCEPHALIC   ARTERIOVENOUS (AV) FISTULA CREATION;  Surgeon: Oris Krystal FALCON, MD;  Location: [facility] OR;  Service: Vascular;  Laterality: Right;   BASCILIC VEIN TRANSPOSITION Left 02/05/2014   Procedure: BASCILIC VEIN TRANSPOSITION;  Surgeon: Krystal FALCON Oris, MD;  Location: [facility] OR;  Service: Vascular;  Laterality: Left;   BASCILIC VEIN TRANSPOSITION Right 01/08/2021   Procedure: RIGHT ARM SECOND STAGE BASCILIC VEIN TRANSPOSITION;  Surgeon: Oris Krystal FALCON, MD;  Location[facility] OR;  Service: Vascular;  Laterality: Right;   BIOPSY  12/17/2021   Procedure: BIOPSY;  Surgeon: Wilhelmenia Aloha Raddle., MD;  Location: [facility] ENDOSCOPY;  Service: Gastroenterology;;   ESOPHAGOGASTRODUODENOSCOPY (EGD) WITH PROPOFOL  N/A 12/17/2021   Procedure: ESOPHAGOGASTRODUODENOSCOPY (EGD) WITH PROPOFOL ;  Surgeon: Wilhelmenia Aloha Raddle., MD;  Location: [facility] ENDOSCOPY;  Service: Gastroenterology;  Laterality: N/A;   FISTULA SUPERFICIALIZATION Left 05/30/2018   Procedure: FISTULA PLICATION BASILIC VEIN TRANSPOSITION;  Surgeon: Eliza Bruckner RAMAN, MD;  Location: [facility] OR;  Service: Vascular;  Laterality: Left;   FISTULA SUPERFICIALIZATION Left 12/18/2019   Procedure: PLICATION OF LEFT ARTERIOVENOUS FISTULA ULCER;  Surgeon: Oris Krystal FALCON, MD;  Location: [facility] OR;  Service: Vascular;  Laterality: Left;   I & D EXTREMITY Right 02/18/2021   Procedure: IRRIGATION AND DEBRIDEMENT OF ARM;  Surgeon: Eliza Bruckner RAMAN, MD;  Location: [facility] OR;  Service: Vascular;  Laterality: Right;   INSERTION OF DIALYSIS CATHETER N/A 05/19/2020   Procedure: TUNNELED INSERTION  OF DIALYSIS CATHETER;  Surgeon: Sheree Penne Bruckner, MD;  Location: [facility] OR;  Service: Vascular;  Laterality: N/A;   PERIPHERAL VASCULAR BALLOON ANGIOPLASTY  12/15/2023   Procedure: PERIPHERAL VASCULAR BALLOON ANGIOPLASTY;  Surgeon: Melia Lynwood ORN, MD;  [facility] INVASIVE CV LAB;  Service: Cardiovascular;;   THROMBECTOMY AND REVISION OF ARTERIOVENTOUS (AV) GORETEX  GRAFT Left 07/28/2020   Procedure: Oversewing of  left arm Brachial cephalic fistula for bleeding.;  Surgeon: Eliza Bruckner RAMAN, MD;  Location: [facility] OR;  Service: Vascular;  Laterality: Left;   VENOGRAM Right 01/31/2014   Procedure: DIALYSIS CATHETER;  Surgeon: Gaile ORN New, MD;  Location: [facility] CATH LAB;  Service: Cardiovascular;  Laterality: Right;   VENOUS ANGIOPLASTY  08/10/2024   Procedure: VENOUS ANGIOPLASTY;  Surgeon: Sheree Penne  Lonni, MD;  Location: [facility];  Service: Cardiovascular;;  svc and innominante   VENOUS ANGIOPLASTY  08/23/2024   Procedure: VENOUS ANGIOPLASTY;  Surgeon: Pearline Norman RAMAN, MD;  Location: [facility];  Service: Cardiovascular;;  70% AVF- 95% innominate     Social History:   reports that she has been smoking cigarettes. She has a 1.8 pack-year smoking history. She has been exposed to tobacco smoke. She has never used smokeless tobacco. She reports current alcohol  use. She reports current drug use. Drug: Marijuana.   Family History:  Her family history is not on file.   Allergies Allergies  Allergen Reactions   Cephalosporins Rash and Other (See Comments)    To both keflex  and cefazolin .  Potential bullous lesion from Ceftriaxone  11/2023.   Tobramycin Sulfate Swelling    Eye swelling   Vancomycin  Swelling     Home Medications  Prior to Admission medications   Medication Sig Start Date End Date Taking? Authorizing Provider  amiodarone  (PACERONE ) 200 MG tablet Take 1 tablet (200 mg total) by mouth daily. 04/24/24   Ghimire, Kuber, MD  b complex-vitamin c-folic acid  (NEPHRO-VITE) 0.8 MG TABS tablet Take 1 tablet by mouth as needed (Supplement). Patient not taking: Reported on 05/31/2024    [provider]  Blood Glucose Monitoring Suppl DEVI 1 each by Does not apply route in the morning, at noon, and at bedtime. May substitute to any manufacturer covered by patient's insurance. 05/22/24   Rosario Leatrice FERNS, MD  Continuous Glucose Sensor (DEXCOM G5 MOB/G4 PLAT SENSOR) MISC Apply to the  arm Patient not taking: Reported on 05/31/2024 05/22/24   Rosario Leatrice FERNS, MD  Continuous Glucose Transmitter (DEXCOM G5 MOBILE TRANSMITTER) MISC Continuous transmitter Patient not taking: Reported on 05/31/2024 05/22/24   Rosario Leatrice FERNS, MD  doxercalciferol  (HECTOROL ) 4 MCG/2ML injection Inject 0.5 mLs (1 mcg total) into the vein Every Tuesday,Thursday,and Saturday with dialysis. 05/23/24   Rosario Leatrice I, MD  Glucose Blood (BLOOD GLUCOSE TEST STRIPS) STRP 1 each by Other route in the morning, at noon, and at bedtime. May substitute to any manufacturer covered by patient's insurance. 05/31/24   Lorren Greig PARAS, NP  Menthol , Topical Analgesic, (BIOFREEZE) 10 % CREA Apply 1 Application topically daily as needed. 05/31/24   Lorren Greig PARAS, NP  midodrine  (PROAMATINE ) 10 MG tablet Take 1 tablet (10 mg total) by mouth 3 (three) times daily with meals. 05/12/24   Swayze, Ava, DO  sertraline  (ZOLOFT ) 25 MG tablet Take 1 tablet (25 mg total) by mouth daily. 05/31/24   Lorren Greig PARAS, NP  sevelamer  carbonate (RENVELA ) 800 MG tablet Take 4 tablets (3,200 mg total) by mouth 3 (three) times daily with meals. 05/31/24   Lorren Greig PARAS, NP  triamcinolone  (KENALOG ) 0.025 % cream Apply 1 Application topically 2 (two) times daily. 06/01/24   Lorren Greig PARAS, NP     Critical care time: 43 min      CRITICAL CARE Performed by: Ronnald FORBES Gave   Total critical care time: 43 minutes  Critical care time was exclusive of separately billable procedures and treating other patients. Critical care was necessary to treat or prevent imminent or life-threatening deterioration.  Critical care was time spent personally by me on the following activities: development of treatment plan with patient and/or surrogate as well as nursing, discussions with consultants, evaluation of patient's response to treatment, examination of patient, obtaining history from patient or surrogate, ordering and performing treatments and  interventions, ordering and review  of laboratory studies, ordering and review of radiographic studies, pulse oximetry and re-evaluation of patient's condition.  Ronnald Gave MSN, AGACNP-BC [place] [facility] Amion for pager  08/31/2024, 11:41 AM

## 2024-08-31 NOTE — Consult Note (Signed)
 Renal Service Consult Note [facility] [name] JONETTA Fret, MD  Patient: [name] Date: 08/31/2024 Requesting Physician: Dr. Kassie  Reason for Consult: ESRD pt w/ SOB HPI: The patient is a 43 y.o. year-old w/ PMH as below who presented to ED c/o SOB x 7 days. Hx of CHF and ESRD on HD. TTS HD, last HD tuesdsay. No fever, no cough. In ED BP 102/55, HR 119, RR 24- 45. Temp 97.8.  HFNC 6 L. K+ 5.3, BUN 55, creat 7.2, BNP > 4500. Hb 13, WBC 8K. CXR w/ CM and diffuse IS pulm edema, similar to prior. Pt admitted to ICU. We are asked to see for dialysis.    Pt seen in ED room. Pt alert and responding appropriately. No CP, no abd pain, no n/v/d.    ROS - denies CP, no joint pain, no HA, no blurry vision, no rash   Past Medical History  Past Medical History:  Diagnosis Date   Anemia    low iron  - receives iron  at dialysis   Anxiety    Arthritis    RA   Atrial fibrillation with RVR (HCC) 12/14/2023   Chronic systolic congestive heart failure (HCC) 03/16/2016   Dyspnea    ESRD (end stage renal disease) (HCC)    [facility] _ [place]   H/O pericarditis 01/17/2013   H/O pleural effusion 01/17/2013   Heart murmur    Lupus (systemic lupus erythematosus) (HCC)    Previously followed with Dr. Everlean, has not followed up recently   Lupus nephritis [facility]) 2006   Renal biopsy shows segmental endocapillary proliferation and cellular crescent formation (Class IIIA) and lupus membranous glomerulopathy (Class V, stage II)   Pneumonia    many times   Polysubstance abuse (HCC)    cocaine , MJ, tobacco   S/P pericardiocentesis 01/17/2013   H/o pericardial effusion with tamponade 2006    Seizures (HCC)    during pregnancy 1 time   Septic shock (HCC) 12/13/2023   Streptococcal bacteremia 01/23/2013   She had two S. pneumonae bacteremia on 01/21/2013. Sensitive to Peniccilin    Past Surgical History  Past Surgical History:  Procedure Laterality Date   A/V FISTULAGRAM  N/A 12/15/2023   Procedure: A/V Fistulagram;  Surgeon: Melia Lynwood ORN, MD;  Location: [facility] INVASIVE CV LAB;  Service: Cardiovascular;  Laterality: N/A;   A/V FISTULAGRAM Right 08/10/2024   Procedure: A/V Fistulagram;  Surgeon: Sheree Penne Bruckner, MD;  Location: [facility] PV LAB;  Service: Cardiovascular;  Laterality: Right;   A/V SHUNT INTERVENTION Right 08/23/2024   Procedure: A/V SHUNT INTERVENTION;  Surgeon: Pearline Norman RAMAN, MD;  Location: [facility] PV LAB;  Service: Cardiovascular;  Laterality: Right;   AV FISTULA PLACEMENT     AV FISTULA PLACEMENT Right 08/01/2020   Procedure: RIGHT ARM BRACHIOCEPHALIC  ARTERIOVENOUS (AV) FISTULA CREATION;  Surgeon: Oris Krystal FALCON, MD;  Location: [facility] OR;  Service: Vascular;  Laterality: Right;   BASCILIC VEIN TRANSPOSITION Left 02/05/2014   Procedure: BASCILIC VEIN TRANSPOSITION;  Surgeon: Krystal FALCON Oris, MD;  Location: [facility] OR;  Service: Vascular;  Laterality: Left;   BASCILIC VEIN TRANSPOSITION Right 01/08/2021   Procedure: RIGHT ARM SECOND STAGE BASCILIC VEIN TRANSPOSITION;  Surgeon: Oris Krystal FALCON, MD;  Location: [facility] OR;  Service: Vascular;  Laterality: Right;   BIOPSY  12/17/2021   Procedure: BIOPSY;  Surgeon: Wilhelmenia Aloha Raddle., MD;  Location: [facility] ENDOSCOPY;  Service: Gastroenterology;;   ESOPHAGOGASTRODUODENOSCOPY (EGD) WITH PROPOFOL  N/A 12/17/2021   Procedure: ESOPHAGOGASTRODUODENOSCOPY (EGD) WITH  PROPOFOL ;  Surgeon: Wilhelmenia Aloha Raddle., MD;  Location: [facility] ENDOSCOPY;  Service: Gastroenterology;  Laterality: N/A;   FISTULA SUPERFICIALIZATION Left 05/30/2018   Procedure: FISTULA PLICATION BASILIC VEIN TRANSPOSITION;  Surgeon: Eliza Lonni RAMAN, MD;  Location: [facility] OR;  Service: Vascular;  Laterality: Left;   FISTULA SUPERFICIALIZATION Left 12/18/2019   Procedure: PLICATION OF LEFT ARTERIOVENOUS FISTULA ULCER;  Surgeon: Oris Krystal FALCON, MD;  Location: [facility] OR;  Service: Vascular;  Laterality: Left;   I & D EXTREMITY Right 02/18/2021   Procedure: IRRIGATION AND  DEBRIDEMENT OF ARM;  Surgeon: Eliza Lonni RAMAN, MD;  Location: [facility] OR;  Service: Vascular;  Laterality: Right;   INSERTION OF DIALYSIS CATHETER N/A 05/19/2020   Procedure: TUNNELED INSERTION  OF DIALYSIS CATHETER;  Surgeon: Sheree Penne Lonni, MD;  Location: [facility] OR;  Service: Vascular;  Laterality: N/A;   PERIPHERAL VASCULAR BALLOON ANGIOPLASTY  12/15/2023   Procedure: PERIPHERAL VASCULAR BALLOON ANGIOPLASTY;  Surgeon: Melia Lynwood ORN, MD;  Location: [facility] INVASIVE CV LAB;  Service: Cardiovascular;;   THROMBECTOMY AND REVISION OF ARTERIOVENTOUS (AV) GORETEX  GRAFT Left 07/28/2020   Procedure: Oversewing of left arm Brachial cephalic fistula for bleeding.;  Surgeon: Eliza Lonni RAMAN, MD;  Location: [facility] OR;  Service: Vascular;  Laterality: Left;   VENOGRAM Right 01/31/2014   Procedure: DIALYSIS CATHETER;  Surgeon: Gaile ORN New, MD;  Location: [facility] CATH LAB;  Service: Cardiovascular;  Laterality: Right;   VENOUS ANGIOPLASTY  08/10/2024   Procedure: VENOUS ANGIOPLASTY;  Surgeon: Sheree Penne Lonni, MD;  Location: [facility] PV LAB;  Service: Cardiovascular;;  svc and innominante   VENOUS ANGIOPLASTY  08/23/2024   Procedure: VENOUS ANGIOPLASTY;  Surgeon: Pearline Norman RAMAN, MD;  Location: [facility] PV LAB;  Service: Cardiovascular;;  70% AVF- 95% innominate   Family History No family history on file. Social History  reports that she has been smoking cigarettes. She has a 1.8 pack-year smoking history. She has been exposed to tobacco smoke. She has never used smokeless tobacco. She reports current alcohol  use. She reports current drug use. Drug: Marijuana. Allergies  Allergies  Allergen Reactions   Cephalosporins Rash and Other (See Comments)    To both keflex  and cefazolin .  Potential bullous lesion from Ceftriaxone  11/2023.   Tobramycin Sulfate Swelling    Eye swelling   Vancomycin  Swelling   Home medications Prior to Admission medications   Medication Sig Start Date End Date Taking? Authorizing  Provider  amiodarone  (PACERONE ) 200 MG tablet Take 1 tablet (200 mg total) by mouth daily. 04/24/24   Ghimire, Kuber, MD  b complex-vitamin c-folic acid  (NEPHRO-VITE) 0.8 MG TABS tablet Take 1 tablet by mouth as needed (Supplement). Patient not taking: Reported on 05/31/2024    [provider]  Blood Glucose Monitoring Suppl DEVI 1 each by Does not apply route in the morning, at noon, and at bedtime. May substitute to any manufacturer covered by patient's insurance. 05/22/24   Rosario Leatrice FERNS, MD  Continuous Glucose Sensor (DEXCOM G5 MOB/G4 PLAT SENSOR) MISC Apply to the arm Patient not taking: Reported on 05/31/2024 05/22/24   Rosario Leatrice FERNS, MD  Continuous Glucose Transmitter (DEXCOM G5 MOBILE TRANSMITTER) MISC Continuous transmitter Patient not taking: Reported on 05/31/2024 05/22/24   Rosario Leatrice FERNS, MD  doxercalciferol  (HECTOROL ) 4 MCG/2ML injection Inject 0.5 mLs (1 mcg total) into the vein Every Tuesday,Thursday,and Saturday with dialysis. 05/23/24   Rosario Leatrice I, MD  Glucose Blood (BLOOD GLUCOSE TEST STRIPS) STRP 1 each by Other route in the morning, at noon, and  at bedtime. May substitute to any manufacturer covered by patient's insurance. 05/31/24   Lorren Greig PARAS, NP  Menthol , Topical Analgesic, (BIOFREEZE) 10 % CREA Apply 1 Application topically daily as needed. 05/31/24   Lorren Greig PARAS, NP  midodrine  (PROAMATINE ) 10 MG tablet Take 1 tablet (10 mg total) by mouth 3 (three) times daily with meals. 05/12/24   Swayze, Ava, DO  sertraline  (ZOLOFT ) 25 MG tablet Take 1 tablet (25 mg total) by mouth daily. 05/31/24   Lorren Greig PARAS, NP  sevelamer  carbonate (RENVELA ) 800 MG tablet Take 4 tablets (3,200 mg total) by mouth 3 (three) times daily with meals. 05/31/24   Lorren Greig PARAS, NP  triamcinolone  (KENALOG ) 0.025 % cream Apply 1 Application topically 2 (two) times daily. 06/01/24   Lorren Greig PARAS, NP     Vitals:   08/31/24 1029 08/31/24 1030 08/31/24 1037 08/31/24 1052  BP:  (!) 59/35 (!) 58/28 (!) 60/52 (!) 78/58  Pulse:  (!) 121 (!) 120   Resp: (!) 30 (!) 29 (!) 36   Temp:      TempSrc:      SpO2:  100% 100%   Weight:      Height:       Exam Gen alert, no distress, thin AAF, mild ^wob Cachectic  No jvd or bruits Chest bibasilar crackles, o/w clear RRR no MRG Abd soft ntnd no mass or ascites +bs Ext no LE edema Neuro is alert, Ox 3 , nf, gen weakness    AVF+bruit   Home bp meds: Midodrine  10 tid   [facility] [place] [facility] 3[facility]  B400   37.5kg   2K bath  AVF  Heparin  none Post 9/02 wt 38.2kg, usual comes off 0-1.5kg over Hb 12, no esa    Assessment/ Plan: SOB/ pulm edema: per CXR. Not grossly overloaded, on high O2, not in distress. Plan iHD in ICU today.  ESRD: on HD TTS. Last HD Tuesday. HD as above.  Hypotension: midodrine  continuing here Volume: not grossly vol overloaded, get vol down w/ HD  Anemia of esrd: Hb 12-15, not on esa at OP unit   Myer Fret  MD [facility] 08/31/2024, 11:07 AM  Recent Labs  Lab 08/31/24 0725 08/31/24 0753  HGB 13.6 16.0*  CALCIUM  9.5  --   CREATININE 7.17* 7.20*  K 5.5* 5.3*   Inpatient medications:  Chlorhexidine  Gluconate Cloth  6 each Topical Daily   heparin   5,000 Units Subcutaneous Q8H   midodrine   10 mg Oral TID WC    docusate sodium , polyethylene glycol

## 2024-08-31 NOTE — ED Notes (Signed)
CCM NP at bedside. 

## 2024-08-31 NOTE — ED Provider Notes (Signed)
 [place] EMERGENCY DEPARTMENT AT [facility] Provider Note   CSN: [id number] Arrival date & time: 08/31/24  9281     Patient presents with: Respiratory Distress   [name] is a 43 y.o. female.  {Add pertinent medical, surgical, social history, OB history to HPI:2522} 43 year old female with past medical history of end-stage renal disease on dialysis Tuesdays, Thursdays, and Saturdays secondary to lupus nephritis as well as CHF presenting to the emergency department today with shortness of breath.  The patient states that she has been having shortness of breath now for about a week.  She denies any fevers.  Reports minimal cough with this.  She denies any hemoptysis.  Denies any leg pain or swelling.  Her last dialysis was on Tuesday.  Denies any missed dialysis.  She denies any associated chest pain.  She came to the emergency department today due to ongoing symptoms.  Her pulse ox on room air was 86% with medics.  She was brought to the ER at that time further evaluation.        Prior to Admission medications   Medication Sig Start Date End Date Taking? Authorizing Provider  amiodarone  (PACERONE ) 200 MG tablet Take 1 tablet (200 mg total) by mouth daily. 04/24/24   Ghimire, Kuber, MD  b complex-vitamin c-folic acid  (NEPHRO-VITE) 0.8 MG TABS tablet Take 1 tablet by mouth as needed (Supplement). Patient not taking: Reported on 05/31/2024    [provider]  Blood Glucose Monitoring Suppl DEVI 1 each by Does not apply route in the morning, at noon, and at bedtime. May substitute to any manufacturer covered by patient's insurance. 05/22/24   Rosario Leatrice FERNS, MD  Continuous Glucose Sensor (DEXCOM G5 MOB/G4 PLAT SENSOR) MISC Apply to the arm Patient not taking: Reported on 05/31/2024 05/22/24   Rosario Leatrice FERNS, MD  Continuous Glucose Transmitter (DEXCOM G5 MOBILE TRANSMITTER) MISC Continuous transmitter Patient not taking: Reported on 05/31/2024 05/22/24   Rosario Leatrice I, MD  doxercalciferol  (HECTOROL ) 4 MCG/2ML injection Inject 0.5 mLs (1 mcg total) into the vein Every Tuesday,Thursday,and Saturday with dialysis. 05/23/24   Rosario Leatrice I, MD  Glucose Blood (BLOOD GLUCOSE TEST STRIPS) STRP 1 each by Other route in the morning, at noon, and at bedtime. May substitute to any manufacturer covered by patient's insurance. 05/31/24   Lorren Greig PARAS, NP  Menthol , Topical Analgesic, (BIOFREEZE) 10 % CREA Apply 1 Application topically daily as needed. 05/31/24   Lorren Greig PARAS, NP  midodrine  (PROAMATINE ) 10 MG tablet Take 1 tablet (10 mg total) by mouth 3 (three) times daily with meals. 05/12/24   Swayze, Ava, DO  sertraline  (ZOLOFT ) 25 MG tablet Take 1 tablet (25 mg total) by mouth daily. 05/31/24   Lorren Greig PARAS, NP  sevelamer  carbonate (RENVELA ) 800 MG tablet Take 4 tablets (3,200 mg total) by mouth 3 (three) times daily with meals. 05/31/24   Lorren Greig PARAS, NP  triamcinolone  (KENALOG ) 0.025 % cream Apply 1 Application topically 2 (two) times daily. 06/01/24   Lorren Greig PARAS, NP    Allergies: Cephalosporins, Tobramycin sulfate, and Vancomycin     Review of Systems  Respiratory:  Positive for shortness of breath.   All other systems reviewed and are negative.   Updated Vital Signs Temp (!) 97.5 F (36.4 C) (Oral)   Ht 5' 2 (1.575 m)   Wt 38 kg   LMP  (LMP Unknown) Comment: states over 2 years ago  BMI 15.32 kg/m   Physical Exam  Vitals and nursing note reviewed.   Gen: Chronically ill-appearing, mild conversational dyspnea noted Eyes: PERRL, EOMI HEENT: no oropharyngeal swelling Neck: trachea midline Resp: Diminished at bilateral lung bases Card: Tachycardic, no murmurs, rubs, or gallops Abd: nontender, nondistended Extremities: no calf tenderness, no edema Vascular: 2+ radial pulses bilaterally, 2+ DP pulses bilaterally Neuro: No focal deficits Skin: no rashes Psyc: acting appropriately   (all labs ordered are listed, but only  abnormal results are displayed) Labs Reviewed  RESP PANEL BY RT-PCR (RSV, FLU A&B, COVID)  [id number]  BASIC METABOLIC PANEL WITH GFR  CBC WITH DIFFERENTIAL/PLATELET  BRAIN NATRIURETIC PEPTIDE  LACTIC ACID, PLASMA  LACTIC ACID, PLASMA    EKG: None  Radiology: No results found.  {Document cardiac monitor, telemetry assessment procedure when appropriate:32947} Procedures   Medications Ordered in the ED  midodrine  (PROAMATINE ) tablet 10 mg (has no administration in time range)      {Click here for ABCD2, HEART and other calculators REFRESH Note before signing:1}                              Medical Decision Making 43 year old female with past medical history of lupus nephritis and end-stage renal disease on dialysis presenting to the emergency department today with shortness of breath.  I will further evaluate the patient here with basic labs as well as a BNP and chest x-ray to evaluate for pulmonary edema, pulmonary infiltrates, or pneumothorax.  EKG will also screen for ACS.  If this is different from her baseline EKG will consider further workup with troponins but she is denying any chest pain at this time.  Will also obtain an RSV/COVID/flu swab on the patient here to evaluate for viral etiologies.  If the patient does not have any pulmonary edema on x-ray we will consider further workup for alternative causes.  I will reevaluate for ultimate disposition.  She is due for dialysis today.  Will discuss her case with nephrology after initial labs return.  She is also found to be hypotensive here.  Will obtain a lactic acid to screen for sepsis although patient states that she normally takes midodrine  and did not take that this morning.  Will give her a dose of this here and reevaluate.  The patient's blood sugar is low here.  She had 2 peripheral IVs that blew.  I did place an ultrasound-guided IV and we will give the patient an amp of D50.  She did receive glucagon  here as well and ate as  well.  Nephrology seen the patient and plans for dialysis this afternoon.  In speaking with the patient her blood pressures normally are in the 80s systolic.  After discussion with nephrology plan is to hold off on IV fluids even with her lactic acidosis.  I will call and discuss her case with the ICU for admission for close monitoring.  I discussed the patient's case with ICU and they will be down to evaluate the patient.  Her blood pressures are trending down into the 50s systolic.  The patient is given a 250 mL bolus and was responding initially.  Blood pressures then improved to the 80s and 90s systolic and subsequently did go back down to the 50s.  A second 250 mL bolus is ordered.  Amount and/or Complexity of Data Reviewed Labs: ordered. Radiology: ordered.  Risk Prescription drug management. Decision regarding hospitalization.   ***  {Document critical care time when appropriate  Document review  of labs and clinical decision tools ie CHADS2VASC2, etc  Document your independent review of radiology images and any outside records  Document your discussion with family members, caretakers and with consultants  Document social determinants of health affecting pt's care  Document your decision making why or why not admission, treatments were needed:32947:::1}   Final diagnoses:  None    ED Discharge Orders     None

## 2024-08-31 NOTE — ED Notes (Signed)
 RT called by RN to place bipap

## 2024-08-31 NOTE — ED Notes (Signed)
 Glucose 41, MD notified. Juice given

## 2024-08-31 NOTE — ED Notes (Signed)
 Report called to [facility] Verneita RN

## 2024-08-31 NOTE — Progress Notes (Signed)
 Pt receives out-pt HD at [facility], TTS, 0720 chair time. Will continue to assist as needed.  Lavanda Aamna Mallozzi Dialysis Navigator [phone]

## 2024-08-31 NOTE — Progress Notes (Signed)
 PCCM Progress Note  Patient became tachycardic to 130s, hypotensive with SBP 80s and increased O2 requirement to 10L. On HD for 1.5 hours and only able to remove 1L.   Dialysis discontinued. SBP improved to 104/49. Consider repeating tomorrow  Will start BiPAP for respiratory support. High risk for intubation

## 2024-08-31 NOTE — Progress Notes (Signed)
 Patient was placed on BiPAP around 5 PM, she started feeling nauseous and pulled out her BiPAP, she vomited.  She was on 10 L oxygen, she is tachycardic but not tachypneic  ABG was done showing pH 7.58/pCO2 32 and PaO2 60   At this time I do not think she needs to be intubated, we can continue to monitor with nasal cannula oxygen   Valinda Novas, MD

## 2024-08-31 NOTE — ED Notes (Addendum)
MD notified of BP

## 2024-08-31 NOTE — ED Notes (Signed)
 MD notified of BP, per MD Ula, CCM on way to see

## 2024-08-31 NOTE — ED Notes (Signed)
 Per nephrology MD Schertz, no fluids to be given for elev lactic. MD notified and aware.

## 2024-08-31 NOTE — ED Notes (Signed)
 Patient transported to X-ray

## 2024-08-31 NOTE — ED Triage Notes (Signed)
 Pt BIB [facility] from home. Increased resp distress for 7 days. Hx of CHF and dialysis. Goes to dialysis Tues Thurs Sat and went on Tuesday. Diminished in lower lobes. Duoneb given en route. No recent illness or cough/fever.  EMS VS 96/52 110 HR 86 spO2 RA

## 2024-08-31 NOTE — Progress Notes (Signed)
 IVT consult placed for new PIV. ED RN was able to obtain access in her left upper arm however IV ultimately infiltrated. Patient is restricted to left arm only due to dialysis needs. Patient is likely to be started on vasopressors due to hypotension - explained to RN that pressors cannot go through PIV in upper arm due to risk for infiltration. Given limited peripheral options, a central line was recommended to preserve vasculature and promptly address acuity.   MD arrived to bedside to place USPIV - explained that pressors should not be infused through an IV in the upper arm due to infiltration risk. Requested consideration for central line. As IVT was leaving bedside, MD was setting up for central access.   Mattilyn Crites R Marliyah Reid, RN

## 2024-08-31 NOTE — ED Notes (Signed)
 Patient provided 12 oz cranberry juice

## 2024-08-31 NOTE — Progress Notes (Addendum)
 eLink Physician-Brief Progress Note Patient Name: [name] DOB: [birth year] MRN: [id number]   Date of Service  08/31/2024  HPI/Events of Note  Recurrent hypoglycemia, CBG 52, 71 after dextrose   eICU Interventions  Initiate dextrose  infusion    2117 -patient had an episode of hypotension and hypoxia with sats dropping in the 60s and SBP 50s/60s.  Mentation remained appropriate.  Unclear whether the cuff is accurate as the remaining hemodynamics are unchanged.  Cuff was repositioned with more appropriate blood pressures.  Patient is requesting to eat and drink Nepro shakes.  Had 1.5 L removed with dialysis today and has been tachycardic since-Will order albumin  and Nepro shakes.  Acetaminophen  for chronic pain.  Intervention Category Intermediate Interventions: Hyperglycemia - evaluation and treatment  [name] 08/31/2024, 8:12 PM

## 2024-08-31 NOTE — ED Notes (Signed)
 Nephrology at bedside

## 2024-08-31 NOTE — Progress Notes (Addendum)
 The patient was unable to tolerate dialysis due to low oxygen saturation, low BP.and rapid heart rate. The treatment was discontinued after on the machine 2:10 hours per physician's orders. A total of 1.1 liters of fluid was removed, and 25 g of albumin  IV  was given during HD. Dr. Geralynn notified.  08/31/24 1714  Vitals  Temp 98.6 F (37 C)  Temp Source Axillary  BP (!) 104/49  Weight 37.6 kg  Type of Weight Post-Dialysis  Oxygen Therapy  O2 Device HHFNC  During Treatment Monitoring  Intra-Hemodialysis Comments See progress note  Post Treatment  Dialyzer Clearance Lightly streaked  Hemodialysis Intake (mL) 0 mL  Liters Processed 45  Fluid Removed (mL) 1100 mL  Tolerated HD Treatment No (Comment)  Post-Hemodialysis Comments see notes  AVG/AVF Arterial Site Held (minutes) 8 minutes  AVG/AVF Venous Site Held (minutes) 8 minutes

## 2024-08-31 NOTE — Progress Notes (Signed)
 Pharmacy Antibiotic Note  [name] is a 43 y.o. female admitted on 08/31/2024 with pneumonia.  Pharmacy has been consulted for levaquin  dosing.  ESRD pt who was admitted for short of breath and PNA. Due to her drug allergies, levaquin  ordered for CAP.  HD TTS  Plan: Levaquin  750mg  IV x1 then 250mg  q48 F/u with LOT  Height: 5' 2 (157.5 cm) Weight: 37.6 kg (82 lb 14.3 oz) IBW/kg (Calculated) : 50.1  Temp (24hrs), Avg:98.2 F (36.8 C), Min:97.5 F (36.4 C), Max:98.6 F (37 C)  Recent Labs  Lab 08/31/24 0725 08/31/24 0753 08/31/24 1227 08/31/24 1447  WBC 8.7  --   --   --   CREATININE 7.17* 7.20* 7.72*  --   LATICACIDVEN  --  6.2* 4.9* 3.2*    Estimated Creatinine Clearance: 5.7 mL/min (A) (by C-G formula based on SCr of 7.72 mg/dL (H)).    Allergies  Allergen Reactions   Cephalosporins Rash and Other (See Comments)    To both keflex  and cefazolin .  Potential bullous lesion from Ceftriaxone  11/2023.   Tobramycin Sulfate Swelling    Eye swelling   Vancomycin  Swelling    Antimicrobials this admission: 9/4 levaquin >>  Dose adjustments this admission:   Microbiology results: 9/4 respiratory panel>>neg MRSA+ 9/4 blood>>  Sergio Batch, PharmD, BCIDP, AAHIVP, CPP Infectious Disease Pharmacist 08/31/2024 6:31 PM

## 2024-09-01 ENCOUNTER — Inpatient Hospital Stay (HOSPITAL_COMMUNITY)

## 2024-09-01 DIAGNOSIS — I484 Atypical atrial flutter: Secondary | ICD-10-CM

## 2024-09-01 DIAGNOSIS — I5023 Acute on chronic systolic (congestive) heart failure: Secondary | ICD-10-CM

## 2024-09-01 DIAGNOSIS — R570 Cardiogenic shock: Secondary | ICD-10-CM

## 2024-09-01 DIAGNOSIS — N186 End stage renal disease: Secondary | ICD-10-CM | POA: Diagnosis not present

## 2024-09-01 DIAGNOSIS — E8729 Other acidosis: Secondary | ICD-10-CM

## 2024-09-01 LAB — CBC
HCT: 36.5 % (ref 36.0–46.0)
Hemoglobin: 12 g/dL (ref 12.0–15.0)
MCH: 27.7 pg (ref 26.0–34.0)
MCHC: 32.9 g/dL (ref 30.0–36.0)
MCV: 84.3 fL (ref 80.0–100.0)
Platelets: 164 K/uL (ref 150–400)
RBC: 4.33 MIL/uL (ref 3.87–5.11)
RDW: 16.2 % — ABNORMAL HIGH (ref 11.5–15.5)
WBC: 6.7 K/uL (ref 4.0–10.5)
nRBC: 0.4 % — ABNORMAL HIGH (ref 0.0–0.2)

## 2024-09-01 LAB — PROTIME-INR
INR: 1.8 — ABNORMAL HIGH (ref 0.8–1.2)
Prothrombin Time: 21.7 s — ABNORMAL HIGH (ref 11.4–15.2)

## 2024-09-01 LAB — ECHOCARDIOGRAM COMPLETE
AR max vel: 2.06 cm2
AV Area VTI: 2.15 cm2
AV Area mean vel: 2.18 cm2
AV Mean grad: 6.5 mmHg
AV Peak grad: 12 mmHg
Ao pk vel: 1.74 m/s
Height: 62 in
S' Lateral: 4.7 cm
Weight: 1386.25 [oz_av]

## 2024-09-01 LAB — GLUCOSE, CAPILLARY
Glucose-Capillary: 58 mg/dL — ABNORMAL LOW (ref 70–99)
Glucose-Capillary: 115 mg/dL — ABNORMAL HIGH (ref 70–99)
Glucose-Capillary: 89 mg/dL (ref 70–99)
Glucose-Capillary: 77 mg/dL (ref 70–99)
Glucose-Capillary: 95 mg/dL (ref 70–99)
Glucose-Capillary: 95 mg/dL (ref 70–99)
Glucose-Capillary: 199 mg/dL — ABNORMAL HIGH (ref 70–99)
Glucose-Capillary: 82 mg/dL (ref 70–99)
Glucose-Capillary: 62 mg/dL — ABNORMAL LOW (ref 70–99)

## 2024-09-01 LAB — PHOSPHORUS: Phosphorus: 6.1 mg/dL — ABNORMAL HIGH (ref 2.5–4.6)

## 2024-09-01 LAB — BASIC METABOLIC PANEL WITH GFR
Anion gap: 20 — ABNORMAL HIGH (ref 5–15)
BUN: 39 mg/dL — ABNORMAL HIGH (ref 6–20)
CO2: 23 mmol/L (ref 22–32)
Calcium: 9.9 mg/dL (ref 8.9–10.3)
Chloride: 92 mmol/L — ABNORMAL LOW (ref 98–111)
Creatinine, Ser: 5.7 mg/dL — ABNORMAL HIGH (ref 0.44–1.00)
GFR, Estimated: 9 mL/min — ABNORMAL LOW (ref >60)
Glucose, Bld: 81 mg/dL (ref 70–99)
Potassium: 4.7 mmol/L (ref 3.5–5.1)
Sodium: 135 mmol/L (ref 135–145)

## 2024-09-01 LAB — BRAIN NATRIURETIC PEPTIDE: B Natriuretic Peptide: >4500 pg/mL — ABNORMAL HIGH (ref 0.0–100.0)

## 2024-09-01 LAB — HEPATITIS B SURFACE ANTIBODY, QUANTITATIVE: Hep B S AB Quant (Post): 95.7 m[IU]/mL

## 2024-09-01 LAB — LACTIC ACID, PLASMA: Lactic Acid, Venous: 4 mmol/L (ref 0.5–1.9)

## 2024-09-01 MED ORDER — CHLORHEXIDINE GLUCONATE CLOTH 2 % EX PADS
6.0000 | MEDICATED_PAD | Freq: Every day | CUTANEOUS | Status: DC
  Administered 2024-09-02: 6 via TOPICAL

## 2024-09-01 MED ORDER — AMIODARONE LOAD VIA INFUSION
150.0000 mg | Freq: Once | INTRAVENOUS | Status: AC
  Administered 2024-09-01: 150 mg via INTRAVENOUS
  Filled 2024-09-01: qty 83.34

## 2024-09-01 MED ORDER — AMIODARONE HCL IN DEXTROSE 360-4.14 MG/200ML-% IV SOLN
60.0000 mg/h | INTRAVENOUS | Status: AC
  Administered 2024-09-01: 60 mg/h via INTRAVENOUS
  Filled 2024-09-01: qty 200

## 2024-09-01 MED ORDER — DEXTROSE 50 % IV SOLN
12.5000 g | Freq: Once | INTRAVENOUS | Status: AC
  Administered 2024-09-01: 12.5 g via INTRAVENOUS
  Filled 2024-09-01: qty 50

## 2024-09-01 MED ORDER — ALBUMIN HUMAN 25 % IV SOLN: 12.5000 g | INTRAVENOUS | Status: AC | PRN

## 2024-09-01 MED ORDER — MIDODRINE HCL 5 MG PO TABS
10.0000 mg | ORAL_TABLET | Freq: Once | ORAL | Status: AC
  Administered 2024-09-02: 10 mg via ORAL

## 2024-09-01 MED ORDER — MUPIROCIN 2 % EX OINT
TOPICAL_OINTMENT | Freq: Two times a day (BID) | CUTANEOUS | Status: AC
  Administered 2024-09-02 – 2024-09-05 (×3): 1 via NASAL
  Filled 2024-09-01: qty 22

## 2024-09-01 MED ORDER — AMIODARONE HCL IN DEXTROSE 360-4.14 MG/200ML-% IV SOLN: 30.0000 mg/h | INTRAVENOUS | Status: DC

## 2024-09-01 MED ORDER — AMIODARONE HCL 200 MG PO TABS
400.0000 mg | ORAL_TABLET | Freq: Two times a day (BID) | ORAL | Status: DC
  Administered 2024-09-01 – 2024-09-11 (×17): 400 mg via ORAL
  Filled 2024-09-01 (×20): qty 2

## 2024-09-01 MED ORDER — NEPRO/CARBSTEADY PO LIQD
237.0000 mL | Freq: Three times a day (TID) | ORAL | Status: DC
  Administered 2024-09-01 – 2024-09-13 (×25): 237 mL via ORAL

## 2024-09-01 MED ORDER — RENA-VITE PO TABS
1.0000 | ORAL_TABLET | Freq: Every day | ORAL | Status: DC
  Administered 2024-09-01 – 2024-09-15 (×14): 1 via ORAL
  Filled 2024-09-01 (×14): qty 1

## 2024-09-01 NOTE — TOC Initial Note (Addendum)
 Transition of Care [facility]) - Initial/Assessment Note    Patient Details  Name: [name] MRN: [id number] Date of Birth: April 06, 1981  Transition of Care [facility]) CM/SW Contact:    Lauraine FORBES Saa, LCSWA Phone Number: 09/01/2024, 11:46 AM  Clinical Narrative:                  11:46 AM CSW introduced self and role to patient. Patient confirmed she resides at home with mother who is able to provide transportation upon discharge. Patient stated that she does not drive and that her mother provides transportation to appointments. Patient confirmed SNF history with [facility] and expressed interest in return if therapy recommended patient to discharge to SNF. CSW requested medical team to place PT/OT orders. Patient denied HH history. Patient confirmed DME (wheelchair, crutches, shower chair) history (per chart review, also has oxygen and nebulizer history). Patient stated only DME she uses at home is shower chair. Patient confirmed outpatient HD history with [facility] 7:20AM chair time. Per chart review, patient has a PCP and insurance. Patient's preferred pharmacy is [facility] [phone] [facility].  Expected Discharge Plan: Home/Self Care Barriers to Discharge: Continued Medical Work up   Patient Goals and CMS Choice            Expected Discharge Plan and Services       Living arrangements for the past 2 months: Single Family Home                                      Prior Living Arrangements/Services Living arrangements for the past 2 months: Single Family Home Lives with:: Parents Patient language and need for interpreter reviewed:: Yes        Need for Family Participation in Patient Care: No (Comment)   Current home services: DME Criminal Activity/Legal Involvement Pertinent to Current Situation/Hospitalization: No - Comment as needed  Activities of Daily Living      Permission Sought/Granted Permission sought to share information with : Family  Supports Permission granted to share information with : No (Contact information on chart)  Share Information with NAME: Malissia Rabbani     Permission granted to share info w Relationship: Mother  Permission granted to share info w Contact Information: [phone]  Emotional Assessment Appearance:: Appears stated age Attitude/Demeanor/Rapport: Engaged Affect (typically observed): Accepting, Appropriate, Adaptable, Calm, Stable, Pleasant Orientation: : Oriented to Situation, Oriented to  Time, Oriented to Place, Oriented to Self Alcohol  / Substance Use: Not Applicable Psych Involvement: No (comment)  Admission diagnosis:  Lactic acidosis [E87.20] Acute pulmonary edema (HCC) [J81.0] Hypoglycemia [E16.2] Acute respiratory failure with hypoxia (HCC) [J96.01] Patient Active Problem List   Diagnosis Date Noted   Acute respiratory failure with hypoxia (HCC) 08/31/2024   Volume overload 05/19/2024   Hypoglycemia 05/19/2024   Dyspnea 05/19/2024   Fluid overload 05/09/2024   Acute hypoxemic respiratory failure (HCC) 05/08/2024   SOB (shortness of breath) 04/17/2024   Protein-calorie malnutrition, severe 01/28/2024   Atrial flutter (HCC) 01/23/2024   Hypotension 01/23/2024   Secondary hypercoagulability disorder (HCC) 01/23/2024   Open thigh wound 01/23/2024   Thrombocytopenia (HCC) 12/14/2023   Atrial fibrillation with RVR (HCC) 12/14/2023   Acute on chronic heart failure (HCC) 12/06/2022   Seizures (HCC) 12/04/2022   R Subdural hematoma (HCC) 12/04/2022   Acute on chronic systolic CHF (congestive heart failure) (HCC) 11/30/2022   Nonrheumatic aortic valve insufficiency 11/30/2022   Nonrheumatic  mitral valve regurgitation 11/30/2022   Non-ischemic cardiomyopathy (HCC) 11/30/2022   Acute febrile illness 04/12/2022   Hypertensive urgency 04/12/2022   GERD (gastroesophageal reflux disease) 04/12/2022   HFrEF (heart failure with reduced ejection fraction) (HCC) 04/12/2022   Elevated  troponin 04/12/2022   SIRS (systemic inflammatory response syndrome) (HCC) 04/12/2022   Hyperkalemia, diminished renal excretion 12/15/2021   Trichomonas vaginitis 03/25/2021   Bacterial vaginitis 03/25/2021   Encounter for screening for COVID-19 01/24/2021   Hemodialysis access, fistula mature (HCC) 01/22/2021   Allergic reaction 01/22/2021   Acute hyperkalemia 07/28/2020   Nicotine  dependence, cigarettes, uncomplicated 07/28/2020   Essential hypertension 07/28/2020   Substance abuse (HCC) 07/28/2020   Hemodialysis catheter malfunction (HCC) 07/28/2020   Complication of AV dialysis fistula, sequela 07/28/2020   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HCC) 05/18/2020   Bacterial intestinal infection, unspecified 12/14/2019   Left shoulder pain 08/15/2019   Cellulitis, unspecified 05/02/2019   Hyperkalemia 07/29/2018   SVT (supraventricular tachycardia) (HCC) 07/16/2018   Cardiomyopathy, unspecified (HCC) 12/03/2016   Diarrhea 04/18/2016   Chronic combined systolic (congestive) and diastolic (congestive) heart failure (HCC) 03/16/2016   Other hypervolemia    S/P thoracentesis    Cough with hemoptysis    Myalgia    Pulmonary edema    ESRD (end stage renal disease) (HCC) 03/03/2016   Dependence on renal dialysis (HCC) 10/15/2015   Rash and nonspecific skin eruption 06/26/2015   Secondary Raynaud's phenomenon 06/24/2015   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   Fever, unspecified 08/28/2014   Pruritus, unspecified 08/28/2014   Headache, unspecified 04/30/2014   Pain, unspecified 04/30/2014   Cramping of hands 04/19/2014   Contraceptive management 04/19/2014   Mild protein-calorie malnutrition (HCC) 03/22/2014   Insomnia 03/15/2014   Benign hypertension with ESRD (end-stage renal disease) (HCC) 03/15/2014   Tobacco abuse 02/15/2014   Healthcare maintenance 02/15/2014   Iron  deficiency anemia, unspecified 02/12/2014   Coagulation defect, unspecified (HCC) 02/10/2014   Anemia due to chronic kidney  disease 02/09/2014   Secondary hyperparathyroidism of renal origin (HCC) 02/09/2014   Hypoalbuminemia 02/01/2014   Nephrotic syndrome 02/01/2014   ESRD on dialysis (HCC) 01/31/2014   Pleural effusion, left 01/31/2014   Microcytic anemia 01/29/2014   Hypocalcemia 01/29/2014   Bacteremia 01/23/2013   Marijuana smoker (HCC) 01/18/2013   H/O pericarditis 01/17/2013   Lupus (systemic lupus erythematosus) (HCC) 01/17/2013   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   S/P pericardiocentesis 01/17/2013   H/O pleural effusion 01/17/2013   Nephrosis 01/17/2013   Preseptal cellulitis 01/17/2013   Cardiac tamponade 12/29/2004   Pericardial effusion (noninflammatory) 12/29/2004   PCP:  Lorren Greig PARAS, NP Pharmacy:   [facility] [phone] - [place], [place] - [address] AT [facility] [address] [place] [address] Phone: [phone] Fax: [phone]     Social Drivers of Health (SDOH) Social History: SDOH Screenings   Food Insecurity: No Food Insecurity (05/20/2024)  Housing: Low Risk  (05/20/2024)  Transportation Needs: No Transportation Needs (05/20/2024)  Utilities: Not At Risk (05/20/2024)  Alcohol  Screen: Low Risk  (05/31/2024)  Depression (PHQ2-9): High Risk (05/31/2024)  Financial Resource Strain: Low Risk  (05/31/2024)  Physical Activity: Inactive (05/31/2024)  Social Connections: Socially Isolated (05/31/2024)  Stress: Stress Concern Present (05/31/2024)  Tobacco Use: High Risk (05/31/2024)  Health Literacy: Adequate Health Literacy (05/31/2024)   SDOH Interventions:     Readmission Risk Interventions    05/22/2024   11:49 AM 04/17/2022    8:36 AM  Readmission Risk Prevention  Plan  Transportation Screening Complete Complete  PCP or Specialist Appt within 3-5 Days  Complete  HRI or Home Care Consult  Complete  Social Work Consult for Recovery Care Planning/Counseling  Complete  Palliative Care Screening  Not Applicable  Medication Review Special educational needs teacher) Complete Complete  PCP or Specialist appointment within 3-5 days of discharge Complete   HRI or Home Care Consult Complete   Palliative Care Screening Not Applicable   Skilled Nursing Facility Not Applicable

## 2024-09-01 NOTE — Consult Note (Addendum)
 Cardiology Consultation  Patient ID: [name] MRN: [id number]; DOB: 14-Dec-1981  Admit date: 08/31/2024 Date of Consult: 09/01/2024  PCP:  Lorren Greig PARAS, NP   [facility] Providers Cardiologist:  Shelda Bruckner, MD     Patient Profile: [name] is a 43 y.o. female with a hx of ESRD on HD, SLE with lupus nephritis, polysubstance abuse, atrial fib/flutter, SLE, hypotension on midodrine , HFrEF, mitral stenosis, mitral regurgitation, aortic regurgitation  who is being seen 09/01/2024 for the evaluation of HFrEF at the request of Dr. Kassie.  History of Present Illness: [name] has past medical history as stated above. She presented to the [facility] ED on 08/31/2024 with respiratory distress. She reported se had been short of breath for about one week. She denied any missed dialysis sessions, but there have been many issues with compliance in the past.  She has had multiple hospital admissions within the past few years. She was most recent here April 2025 for similar symptoms, when cardiology was consulted. She was followed by cardiology during her visit, she was started on IV amiodarone  and transitioned to PO, remain on dialysis for volume management. They discussed anticoagulation and determined it would be best to avoid any anticoagulation give subdural hematoma and recent and recurrent falls. It was also determined that she would not be a candidate for any valvular interventions.  She has presented to the ED/been admitted 7 times just this past year with most visits regarding shortness of breath/pulmonary edema.   Relevant workup while in the ED/since admission includes: BNP > 4,500 (stable), BMP showed hypoglycemia, creatinine stable, lactic acid 6.2 on admission, 4.0 now, respiratory panel negative, CXR showed pulmonary edema. Pending updated echocardiogram. EKG shows atrial flutter with HR 124.   After speaking with the patient, she agrees with the history as  stated above. She is resting in her room and reports feeling very tired but is still able to have a conversation. She tells me that her breathing is much improved from when she got here. She is currently on 5 L via [place]. She denies any chest pain or palpitations. Her HR seems to be maintaining 110s. It appears that when she was started on IV amiodarone  during prior admission her HR stayed in high 90s.   Past Medical History:  Diagnosis Date   Anemia    low iron  - receives iron  at dialysis   Anxiety    Arthritis    RA   Atrial fibrillation with RVR (HCC) 12/14/2023   Chronic systolic congestive heart failure (HCC) 03/16/2016   Dyspnea    ESRD (end stage renal disease) (HCC)    [facility] _ [place]   H/O pericarditis 01/17/2013   H/O pleural effusion 01/17/2013   Heart murmur    Lupus (systemic lupus erythematosus) (HCC)    Previously followed with Dr. 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 pneumonae bacteremia on 01/21/2013. Sensitive to Peniccilin    Past Surgical History:  Procedure  Laterality Date   A/V FISTULAGRAM N/A 12/15/2023   Procedure: A/V Fistulagram;  Surgeon: Melia Lynwood ORN, MD;  Location: [facility] INVASIVE CV LAB;  Service: Cardiovascular;  Laterality: N/A;   A/V FISTULAGRAM Right 08/10/2024   Procedure: A/V Fistulagram;  Surgeon: Sheree Penne Bruckner, MD;  Location: [facility] PV LAB;  Service: Cardiovascular;  Laterality: Right;   A/V SHUNT INTERVENTION Right 08/23/2024   Procedure: A/V SHUNT  INTERVENTION;  Surgeon: Pearline Norman RAMAN, MD;  Location: [facility] PV LAB;  Service: Cardiovascular;  Laterality: Right;   AV FISTULA PLACEMENT     AV FISTULA PLACEMENT Right 08/01/2020   Procedure: RIGHT ARM BRACHIOCEPHALIC  ARTERIOVENOUS (AV) FISTULA CREATION;  Surgeon: Oris Krystal FALCON, MD;  Location: [facility] OR;  Service: Vascular;  Laterality: Right;   BASCILIC VEIN TRANSPOSITION Left 02/05/2014   Procedure: BASCILIC VEIN TRANSPOSITION;  Surgeon: Krystal FALCON Oris, MD;  Location: [facility] OR;  Service: Vascular;  Laterality: Left;   BASCILIC VEIN TRANSPOSITION Right 01/08/2021   Procedure: RIGHT ARM SECOND STAGE BASCILIC VEIN TRANSPOSITION;  Surgeon: Oris Krystal FALCON, MD;  Location: [facility] OR;  Service: Vascular;  Laterality: Right;   BIOPSY  12/17/2021   Procedure: BIOPSY;  Surgeon: Wilhelmenia Aloha Raddle., MD;  Location: [facility] ENDOSCOPY;  Service: Gastroenterology;;   ESOPHAGOGASTRODUODENOSCOPY (EGD) WITH PROPOFOL  N/A 12/17/2021   Procedure: ESOPHAGOGASTRODUODENOSCOPY (EGD) WITH PROPOFOL ;  Surgeon: Wilhelmenia Aloha Raddle., MD;  Location: [facility] ENDOSCOPY;  Service: Gastroenterology;  Laterality: N/A;   FISTULA SUPERFICIALIZATION Left 05/30/2018   Procedure: FISTULA PLICATION BASILIC VEIN TRANSPOSITION;  Surgeon: Eliza Bruckner RAMAN, MD;  Location: [facility] OR;  Service: Vascular;  Laterality: Left;   FISTULA SUPERFICIALIZATION Left 12/18/2019   Procedure: PLICATION OF LEFT ARTERIOVENOUS FISTULA ULCER;  Surgeon: Oris Krystal FALCON, MD;  Location: [facility] OR;  Service: Vascular;  Laterality: Left;   I & D EXTREMITY Right 02/18/2021   Procedure: IRRIGATION AND DEBRIDEMENT OF ARM;  Surgeon: Eliza Bruckner RAMAN, MD;  Location: [facility] OR;  Service: Vascular;  Laterality: Right;   INSERTION OF DIALYSIS CATHETER N/A 05/19/2020   Procedure: TUNNELED INSERTION  OF DIALYSIS CATHETER;  Surgeon: Sheree Penne Bruckner, MD;  Location: [facility] OR;  Service: Vascular;  Laterality: N/A;   PERIPHERAL VASCULAR BALLOON ANGIOPLASTY  12/15/2023   Procedure: PERIPHERAL  VASCULAR BALLOON ANGIOPLASTY;  Surgeon: Melia Lynwood ORN, MD;  Location: [facility] INVASIVE CV LAB;  Service: Cardiovascular;;   THROMBECTOMY AND REVISION OF ARTERIOVENTOUS (AV) GORETEX  GRAFT Left 07/28/2020   Procedure: Oversewing of left arm Brachial cephalic fistula for bleeding.;  Surgeon: Eliza Bruckner RAMAN, MD;  Location: [facility] OR;  Service: Vascular;  Laterality: Left;   VENOGRAM Right 01/31/2014   Procedure: DIALYSIS CATHETER;  Surgeon: Gaile ORN New, MD;  Location: [facility] CATH LAB;  Service: Cardiovascular;  Laterality: Right;   VENOUS ANGIOPLASTY  08/10/2024   Procedure: VENOUS ANGIOPLASTY;  Surgeon: Sheree Penne Bruckner, MD;  Location: [facility] PV LAB;  Service: Cardiovascular;;  svc and innominante   VENOUS ANGIOPLASTY  08/23/2024   Procedure: VENOUS ANGIOPLASTY;  Surgeon: Pearline Norman RAMAN, MD;  Location: [facility] PV LAB;  Service: Cardiovascular;;  70% AVF- 95% innominate    Home Medications:  Prior to Admission medications   Medication Sig Start Date End Date Taking? Authorizing Provider  amiodarone  (PACERONE ) 200 MG tablet Take 1 tablet (200 mg total) by mouth daily. 04/24/24   Ghimire, Kuber, MD  b complex-vitamin c-folic acid  (NEPHRO-VITE) 0.8 MG TABS tablet Take 1 tablet by mouth as needed (Supplement). Patient not taking: Reported on 05/31/2024    [provider]  doxercalciferol  (HECTOROL ) 4 MCG/2ML injection Inject 0.5 mLs (1 mcg total) into the vein Every Tuesday,Thursday,and Saturday with dialysis. 05/23/24   Rosario Leatrice FERNS, MD  Menthol , Topical Analgesic, (BIOFREEZE) 10 % CREA Apply 1 Application topically daily as needed. 05/31/24   Lorren Greig PARAS, NP  midodrine  (PROAMATINE ) 10 MG tablet Take 1 tablet (10 mg total) by mouth 3 (three) times daily with meals. 05/12/24   Swayze, Ava, DO  sertraline  (ZOLOFT ) 25 MG tablet Take 1 tablet (25 mg total) by mouth daily. 05/31/24   Lorren Greig PARAS, NP  sevelamer  carbonate (RENVELA ) 800 MG tablet Take 4 tablets (3,200 mg total) by mouth 3 (three)  times daily with meals. 05/31/24   Lorren Greig PARAS, NP  triamcinolone  (KENALOG ) 0.025 % cream Apply 1 Application topically 2 (two) times daily. 06/01/24   Lorren Greig PARAS, NP   Scheduled Meds:  amiodarone   150 mg Intravenous Once   Chlorhexidine  Gluconate Cloth  6 each Topical Daily   [START ON 09/02/2024] Chlorhexidine  Gluconate Cloth  6 each Topical Q0600   feeding supplement (NEPRO CARB STEADY)  237 mL Oral TID BM   heparin   5,000 Units Subcutaneous Q8H   midodrine   10 mg Oral TID WC   midodrine   10 mg Oral Once in dialysis   multivitamin  1 tablet Oral QHS   Continuous Infusions:  sodium chloride  Stopped (08/31/24 2132)   albumin  human     amiodarone      Followed by   amiodarone      dextrose  20 mL/hr at 09/01/24 1300   norepinephrine  (LEVOPHED ) Adult infusion     PRN Meds: acetaminophen , albumin  human, docusate sodium , polyethylene glycol  Allergies:    Allergies  Allergen Reactions   Cephalosporins Rash and Other (See Comments)    To both keflex  and cefazolin .  Potential bullous lesion from Ceftriaxone  11/2023.   Tobramycin Sulfate Swelling    Eye swelling   Vancomycin  Swelling   Social History:   Social History   Socioeconomic History   Marital status: Single    Spouse name: Not on file   Number of children: Not on file   Years of education: Not on file   Highest education level: Not on file  Occupational History   Not on file  Tobacco Use   Smoking status: Every Day    Current packs/day: 0.12    Average packs/day: 0.1 packs/day for 15.0 years (1.8 ttl pk-yrs)    Types: Cigarettes    Passive exposure: Current   Smokeless tobacco: Never   Tobacco comments:    4 cigarettes per day  Vaping Use   Vaping status: Never Used  Substance and Sexual Activity   Alcohol  use: Yes    Alcohol /week: 0.0 standard drinks of alcohol     Comment: Special Occasional takes Vicar   Drug use: Yes    Types: Marijuana    Comment: ocassional, last time- 12/21/20   Sexual activity:  Not Currently    Birth control/protection: None  Other Topics Concern   Not on file  Social History Narrative   Not on file   Social Drivers of Health   Financial Resource Strain: Low Risk  (05/31/2024)   Overall Financial Resource Strain (CARDIA)    Difficulty of Paying Living Expenses: Not hard at all  Food Insecurity: No Food Insecurity (05/20/2024)   Hunger Vital Sign    Worried About Running Out of Food in the Last Year: Never true    Ran Out of Food in  the Last Year: Never true  Transportation Needs: No Transportation Needs (05/20/2024)   PRAPARE - Administrator, Civil Service (Medical): No    Lack of Transportation (Non-Medical): No  Physical Activity: Inactive (05/31/2024)   Exercise Vital Sign    Days of Exercise per Week: 0 days    Minutes of Exercise per Session: 0 min  Stress: Stress Concern Present (05/31/2024)   [employer] of Occupational Health - Occupational Stress Questionnaire    Feeling of Stress : To some extent  Social Connections: Socially Isolated (05/31/2024)   Social Connection and Isolation Panel    Frequency of Communication with Friends and Family: Never    Frequency of Social Gatherings with Friends and Family: Never    Attends Religious Services: Never    Database administrator or Organizations: No    Attends Banker Meetings: Never    Marital Status: Never married  Intimate Partner Violence: Not At Risk (05/20/2024)   Humiliation, Afraid, Rape, and Kick questionnaire    Fear of Current or Ex-Partner: No    Emotionally Abused: No    Physically Abused: No    Sexually Abused: No    Family History:   No family history on file.   ROS:  Please see the history of present illness.  All other ROS reviewed and negative.     Physical Exam/Data: Vitals:   09/01/24 1215 09/01/24 1230 09/01/24 1245 09/01/24 1300  BP: (!) 107/55 (!) 112/53 (!) 116/58 (!) 114/55  Pulse:  (!) 115 (!) 116 (!) 117  Resp: (!) 28 (!) 26 (!) 25 (!)  33  Temp:      TempSrc:      SpO2:  100% 100% 98%  Weight:      Height:        Intake/Output Summary (Last 24 hours) at 09/01/2024 1408 Last data filed at 09/01/2024 1300 Gross per 24 hour  Intake 716.87 ml  Output 1100 ml  Net -383.13 ml      09/01/2024    4:18 AM 08/31/2024    5:14 PM 08/31/2024    2:45 PM  Last 3 Weights  Weight (lbs) 86 lb 10.3 oz 82 lb 14.3 oz 85 lb 5.1 oz  Weight (kg) 39.3 kg 37.6 kg 38.7 kg     Body mass index is 15.85 kg/m.   General:  cachectic, chronically ill-appearing, resting in bed, on 5 L oxygen via [place], in no acute distress HEENT: normal Neck: no JVD Vascular:  Distal pulses 2+ bilaterally Cardiac:  RRR; no murmur Lungs:  bibasilar crackles   Abd: soft, nontender, no hepatomegaly  Ext: no edema Musculoskeletal:  No deformities, BUE and BLE strength normal and equal Skin: warm and dry  Neuro:  no focal abnormalities noted Psych:  Normal affect   EKG:  The EKG was personally reviewed and demonstrates:  atrial flutter, HR 124   Telemetry:  Telemetry was personally reviewed and demonstrates:  atrial flutter, HR 110s  Relevant CV Studies:  Echocardiogram, 09/01/2024 Ordered, pending results   Echocardiogram, 05/19/2024 Left ventricular ejection fraction, by estimation, is 35 to 40% . The left ventricle has moderately decreased function. The left ventricle demonstrates global hypokinesis. The left ventricular internal cavity size was moderately dilated. There is mild left ventricular hypertrophy. Left ventricular diastolic parameters are indeterminate. There is the interventricular septum is flattened in systole and diastole, consistent with right ventricular pressure and volume overload.  Right ventricular systolic function is severely reduced. The right ventricular  size is moderately enlarged.  Left atrial size was severely dilated.  Right atrial size was severely dilated.  The mitral valve is rheumatic. Moderate mitral valve regurgitation. Mild  mitral stenosis. Moderate mitral annular calcification.  Tricuspid valve regurgitation is moderate to severe.  The aortic valve is tricuspid. Aortic valve regurgitation is severe. Aortic valve sclerosis/ calcification is present, without any evidence of aortic stenosis.  The inferior vena cava is dilated in size with < 50% respiratory variability, suggesting right atrial pressure of 15 mmHg.  Laboratory Data: High Sensitivity Troponin:  No results for input(s): TROPONINIHS in the last 720 hours.   Chemistry Recent Labs  Lab 08/31/24 0725 08/31/24 0753 08/31/24 1227 08/31/24 1822 09/01/24 0413  NA 134* 129*  --  132* 135  K 5.5* 5.3*  --  3.8 4.7  CL 91* 101  --   --  92*  CO2 18*  --   --   --  23  GLUCOSE 42* 41*  --   --  81  BUN 49* 55*  --   --  39*  CREATININE 7.17* 7.20* 7.72*  --  5.70*  CALCIUM  9.5  --   --   --  9.9  GFRNONAA 7*  --  6*  --  9*  ANIONGAP 25*  --   --   --  20*    No results for input(s): PROT, ALBUMIN , AST, ALT, ALKPHOS, BILITOT in the last 168 hours. Lipids No results for input(s): CHOL, TRIG, HDL, LABVLDL, LDLCALC, CHOLHDL in the last 168 hours.  Hematology Recent Labs  Lab 08/31/24 0725 08/31/24 0753 08/31/24 1822 09/01/24 0413  WBC 8.7  --   --  6.7  RBC 4.90  --   --  4.33  HGB 13.6 16.0* 12.6 12.0  HCT 43.1 47.0* 37.0 36.5  MCV 88.0  --   --  84.3  MCH 27.8  --   --  27.7  MCHC 31.6  --   --  32.9  RDW 16.9*  --   --  16.2*  PLT 191  --   --  164   Thyroid No results for input(s): TSH, FREET4 in the last 168 hours.  BNP Recent Labs  Lab 08/31/24 0725 09/01/24 0413  BNP >4,500.0* >4,500.0*    DDimer No results for input(s): DDIMER in the last 168 hours.  Radiology/Studies:  ECHOCARDIOGRAM COMPLETE Result Date: 09/01/2024    ECHOCARDIOGRAM REPORT   Patient Name:   DELORES EDELSTEIN Date of Exam: 09/01/2024 Medical Rec #:  [id number]      Height:       62.0 in Accession #:    [id number]     Weight:        86.6 lb Date of Birth:  July 03, 1981     BSA:          1.340 m Patient Age:    42 years       BP:           112/53 mmHg Patient Gender: F              HR:           116 bpm. Exam Location:  Inpatient Procedure: 2D Echo, Color Doppler and Cardiac Doppler (Both Spectral and Color            Flow Doppler were utilized during procedure). Indications:    CHF I50.9  History:        Patient has prior history of Echocardiogram examinations,  most                 recent 05/19/2024. CHF; Arrythmias:Atrial Fibrillation.  Sonographer:    Tinnie Gosling RDCS Referring Phys: MISSY SANDHOFF IMPRESSIONS  1. Left ventricular ejection fraction, by estimation, is 30 to 35%. The left ventricle has moderately decreased function. The left ventricle demonstrates global hypokinesis. The left ventricular internal cavity size was moderately dilated. There is mild  left ventricular hypertrophy. Left ventricular diastolic function could not be evaluated.  2. Right ventricular systolic function is severely reduced. The right ventricular size is enlarged. There is mildly elevated pulmonary artery systolic pressure.  3. Left atrial size was severely dilated.  4. Right atrial size was severely dilated.  5. The mitral valve is degenerative. Moderate mitral valve regurgitation. No evidence of mitral stenosis.  6. The tricuspid valve is degenerative. Tricuspid valve regurgitation is severe.  7. The aortic valve is tricuspid. There is moderate calcification of the aortic valve. There is moderate thickening of the aortic valve. Aortic valve regurgitation is moderate to severe. No aortic stenosis is present.  8. The inferior vena cava is dilated in size with <50% respiratory variability, suggesting right atrial pressure of 15 mmHg. Comparison(s): A prior study was performed on 05/19/2024. Conclusion(s)/Recommendation(s): Consider workup for cardiomyopathy ddx includes but not limited too: non-compaction (see apical two chamber), infiltrative process (i.e.  cardiac amyloidosis, etc), clinical correlation required. FINDINGS  Left Ventricle: Left ventricular ejection fraction, by estimation, is 30 to 35%. The left ventricle has moderately decreased function. The left ventricle demonstrates global hypokinesis. The left ventricular internal cavity size was moderately dilated. There is mild left ventricular hypertrophy. Left ventricular diastolic function could not be evaluated due to nondiagnostic images. Left ventricular diastolic function could not be evaluated. Right Ventricle: The right ventricular size is enlarged. Right vetricular wall thickness was not well visualized. Right ventricular systolic function is severely reduced. There is mildly elevated pulmonary artery systolic pressure. The tricuspid regurgitant velocity is 2.71 m/s, and with an assumed right atrial pressure of 15 mmHg, the estimated right ventricular systolic pressure is 44.4 mmHg. Left Atrium: Left atrial size was severely dilated. Right Atrium: Right atrial size was severely dilated. Pericardium: There is no evidence of pericardial effusion. Mitral Valve: The mitral valve is degenerative in appearance. Moderate mitral valve regurgitation. No evidence of mitral valve stenosis. Tricuspid Valve: The tricuspid valve is degenerative in appearance. Tricuspid valve regurgitation is severe. No evidence of tricuspid stenosis. Aortic Valve: The aortic valve is tricuspid. There is moderate calcification of the aortic valve. There is moderate thickening of the aortic valve. There is moderate to severe aortic valve annular calcification. Aortic valve regurgitation is moderate to severe. No aortic stenosis is present. Aortic valve mean gradient measures 6.5 mmHg. Aortic valve peak gradient measures 12.0 mmHg. Aortic valve area, by VTI measures 2.15 cm. Pulmonic Valve: The pulmonic valve was grossly normal. Pulmonic valve regurgitation is mild to moderate. No evidence of pulmonic stenosis. Aorta: The aortic root  and ascending aorta are structurally normal, with no evidence of dilitation. Venous: The inferior vena cava is dilated in size with less than 50% respiratory variability, suggesting right atrial pressure of 15 mmHg. IAS/Shunts: The interatrial septum was not well visualized.  LEFT VENTRICLE PLAX 2D LVIDd:         5.70 cm LVIDs:         4.70 cm LV PW:         1.20 cm LV IVS:  1.20 cm LVOT diam:     2.00 cm LV SV:         54 LV SV Index:   40 LVOT Area:     3.14 cm  RIGHT VENTRICLE            IVC RV S prime:     6.20 cm/s  IVC diam: 2.30 cm TAPSE (M-mode): 0.8 cm LEFT ATRIUM              Index         RIGHT ATRIUM           Index LA diam:        4.70 cm  3.51 cm/m    RA Area:     24.70 cm LA Vol (A2C):   139.0 ml 103.75 ml/m  RA Volume:   87.80 ml  65.54 ml/m LA Vol (A4C):   101.0 ml 75.39 ml/m LA Biplane Vol: 119.0 ml 88.83 ml/m  AORTIC VALVE AV Area (Vmax):    2.06 cm AV Area (Vmean):   2.18 cm AV Area (VTI):     2.15 cm AV Vmax:           173.50 cm/s AV Vmean:          118.000 cm/s AV VTI:            0.250 m AV Peak Grad:      12.0 mmHg AV Mean Grad:      6.5 mmHg LVOT Vmax:         114.00 cm/s LVOT Vmean:        81.800 cm/s LVOT VTI:          0.171 m LVOT/AV VTI ratio: 0.69  AORTA Ao Root diam: 3.10 cm Ao Asc diam:  2.90 cm TRICUSPID VALVE TR Peak grad:   29.4 mmHg TR Vmax:        271.00 cm/s  SHUNTS Systemic VTI:  0.17 m Systemic Diam: 2.00 cm Sunit Tolia Electronically signed by Madonna Large Signature Date/Time: 09/01/2024/1:57:26 PM    Final    DG Chest 2 View Result Date: 08/31/2024 CLINICAL DATA:  Shortness of breath.  Respiratory distress. EXAM: CHEST - 2 VIEW COMPARISON:  05/22/2024 FINDINGS: The cardio pericardial silhouette is enlarged. Diffuse interstitial and alveolar opacity again noted bilaterally, similar to prior. Probable small left pleural effusion. Telemetry leads overlie the chest. 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 IMPRESSION: Diffuse interstitial and alveolar opacity  bilaterally, similar to prior. Imaging features suggest pulmonary edema. Electronically Signed   By: Camellia Candle M.D.   On: 08/31/2024 08:13   Assessment and Plan:  Atrial flutter with RVR Presented with shortness of breath  EKG showed atrial flutter with HR 124 Currently in atrial flutter with HR 110s She denies any active chest pain, palpitations  Not on AC due to recurrent/recent falls and subdural hematomas Home meds: amiodarone  200 mg daily  Currently on home PO amiodarone  200 mg daily  Switch to IV amiodarone  bolus + drip for further HR control Continue with dialysis for volume management  Acute on chronic biventricular heart failure Suspect 2/2 to volume overload Patient has known history of BiV heart failure Documented issues with noncompliance with HD Volume management per dialysis  GDMT limited due to hypotension, renal function Echo 04/2024: LVEF 35-40%, mild LVH, severely reduced RV function She reports much improvement in her breathing since admission Currently on 5 L oxygen via [place] Chronically on midodrine  for BP support  Continue volume management  per dialysis  Pending updated echocardiogram   Moderate mitral valve regurgitation Mild mitral stenosis Moderate-severe tricuspid valve regurgitation Moderate-severe aortic valve regurgitation Echo from May 2025 showed above valvular abnormalities  Patient not a candidate for any type of valve repair or replacement Will monitor on updated echocardiogram    Goals of care Unclear per chart review if patient has ever had formal palliative care consult  With multiple end stage chronic diseases and guarded prognosis would benefit from their input  Recommend palliative care consult if patient has not already had discussion with them  Per primary Acute hypoxic respiratory failure ESRD on HD SLE  Lupus nephritis  Electrolyte disturbances Hypoglycemia  Diabetes  History of polysubstance abuse   Risk Assessment/Risk  Scores:      [place]  Heart Association (NYHA) Functional Class NYHA Class III  CHA2DS2-VASc Score = 3   This indicates a 3.2% annual risk of stroke. The patient's score is based upon: CHF History: 1 HTN History: 1 Diabetes History: 0 Stroke History: 0 Vascular Disease History: 0 Age Score: 0 Gender Score: 1       For questions or updates, please contact [facility] Please consult [email or web address] for contact info under    Signed, Waddell DELENA Donath, PA-C  09/01/2024 2:08 PM

## 2024-09-01 NOTE — Progress Notes (Signed)
 IV team consult placed for additional US  placed PIV access. Patient was assessed in the ED on 9/4 when a central line was initially recommended to preserve limited vasculature related to her dialysis needs. There was concern that vasopressors would be needed which cannot infuse through an IV in the upper arm - this recommendation was given to the ED MD who ultimately placed an USGPIV in her left upper arm. This subsequently infiltrated in addition to the other previously infiltrated PIV in her left AC. Currently has 1 PIV placed by the IVT in her left lower arm - requested primary RN continue to monitor this site and infiltration sites to ensure further damage doesn't occur.   Arrived to bedside to discuss with Denys, RN. She explained that MD requested additional access in the event that her blood pressure drops during dialysis. Reiterated previous recommendation of central line and assessment findings from the 4th. She will reach out to the provider to discuss further.   Kelbie Moro R Nyriah Coote, RN

## 2024-09-01 NOTE — Progress Notes (Addendum)
 NAME:  [name], MRN:  [id number], DOB:  1981-11-18, LOS: 1 ADMISSION DATE:  08/31/2024, CONSULTATION DATE:  08/31/24 REFERRING MD: Dr. 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 Also had decreased appetite and fatigue. Denies fevers, chills. In ED, started on BiPAP. Able to transition to 6L [place]. CXR with pulmonary edema. K 5.3 BNP elevated. Normal WBC. LA 6.2>3.2. Given levaquin . SBP readings low-normal with intermittent readings with SBP in 60s and 70s but mentating well and eating a chicken wrap. PCCM and Nephrology consulted. Plan for hemodialysis in ICU.   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  Interim History / Subjective:  Overnight, she was intermittently hypotensive, with hypoglycemia.  Started on IV dextrose  infusion.  Repeat CBG this morning is 95.  Weaning off supplemental oxygen, now on 5 L.  Blood pressure improved.  Objective   Blood pressure (!) 109/52, pulse (!) 118, temperature 97.7 F (36.5 C), temperature source Oral, resp. rate (!) 21, height 5' 2 (1.575 m), weight 39.3 kg, SpO2 100%.    Vent Mode: BIPAP FiO2 (%):  [60 %] 60 % PEEP:  [6 cmH20] 6 cmH20 Pressure Support:  [6 cmH20] 6 cmH20   Intake/Output Summary (Last 24 hours) at 09/01/2024 1137 Last data filed at 09/01/2024  0600 Gross per 24 hour  Intake 697.09 ml  Output 1100 ml  Net -402.91 ml   Filed Weights   08/31/24 1445 08/31/24 1714 09/01/24 0418  Weight: 38.7 kg 37.6 kg 39.3 kg   Examination: General: Cachectic appearing, not in acute distress. Lungs: Bilateral crackles. Cardiovascular: Regular rhythm, tachycardic.  No murmurs Abdomen: Soft, nontender.  Normal bowel sounds.   Extremities: No edema.  Labs: WBC 6.3 BMP: Normal electrolytes Lactic acid: 4.0  Resolved Hospital Problem list   None.  Assessment & Plan:  Acute hypoxic respiratory failure (vs AoC hypoxic resp failure)  Pulmonary edema Patient is improving and weaning off supplemental oxygen, currently on 5 L. Afebrile, without leukocytosis. Elevated lactic acid likely reflects decreased clearance from ESRD. Both RVP and BCx remain negative at 24 hours. Acute hypoxemic respiratory failure is secondary to pulmonary edema. Patient underwent dialysis yesterday with removal of 1 L of fluid; however, session was discontinued due to persistent hypotension. Pneumonia is unlikely given absence of fever, leukocytosis, or focal consolidation. Plan:  - Wean off supplemental oxygen as tolerated. - Levaquin  discontinued, I doubt she has a lung infection.    Chronic heart failure Mitral regurg Tricuspid regurg Aortic regurg  Echocardiogram (05/25) showed EF 35-40% with dilated LV and mildly decreased function, biatrial dilation, and severe aortic and tricuspid regurgitation. Patient is hemodynamically stable on home midodrine ; BP improved, extremities warm. Cardiogenic shock is unlikely. Concern for worsening CHF--repeat echocardiogram ordered. Cardiology consulted for further evaluation.  Plan: - Continue home midodrine . - Repeat echo  Atrial fibrillation Heart rate 112-120, HDS.  On rhythm control with amiodarone .  Plan: -Cardiac monitoring. - Not on anticoagulation due to history of subdural hematoma.  ESRD  Lupus nephritis   AGMA Hyperkalemia Hypocalcemia  ESRD on HD, stable electrolyte. BP improved, will attempt HD with levo support later this evening/tonight.   - Nephrology following. - BMP.  IDDM with hypoglycemia   Blood sugars improved with IV dextrose  infusion, tolerating p.o. diet. - Blood sugars Q4hrs. - SSI.   Severe protein calorie malnutrition Unintentional wt loss Physical deconditioning, generalized wknss  P -RDN consult this admission -PT/OT when appropriate   Best Practice (right click and Reselect all SmartList Selections daily)   Diet/type: Regular diet. DVT prophylaxis: systemic dose LMWH GI prophylaxis: N/A Lines: yes and it is still needed Foley:  N/A Code Status:  full code  Labs   CBC: Recent Labs  Lab 08/31/24 0725 08/31/24 0753 08/31/24 1822 09/01/24 0413  WBC 8.7  --   --  6.7  NEUTROABS 5.6  --   --   --   HGB 13.6 16.0* 12.6 12.0  HCT 43.1 47.0* 37.0 36.5  MCV 88.0  --   --  84.3  PLT 191  --   --  164    Basic Metabolic Panel: Recent Labs  Lab 08/31/24 0725 08/31/24 0753 08/31/24 1227 08/31/24 1822 09/01/24 0413  NA 134* 129*  --  132* 135  K 5.5* 5.3*  --  3.8 4.7  CL 91* 101  --   --  92*  CO2 18*  --   --   --  23  GLUCOSE 42* 41*  --   --  81  BUN 49* 55*  --   --  39*  CREATININE 7.17* 7.20* 7.72*  --  5.70*  CALCIUM  9.5  --   --   --  9.9  PHOS  --   --   --   --  6.1*   GFR: Estimated Creatinine Clearance: 8.1 mL/min (A) (by C-G formula based on SCr of 5.7 mg/dL (H)). Recent Labs  Lab 08/31/24 0725 08/31/24 0753 08/31/24 1227 08/31/24 1447 08/31/24 2000 08/31/24 2339 09/01/24 0413  WBC 8.7  --   --   --   --   --  6.7  LATICACIDVEN  --    < > 4.9* 3.2* 4.5* 4.0*  --    < > = values in this interval not displayed.    Liver Function Tests: No results for input(s): AST, ALT, ALKPHOS, BILITOT, PROT, ALBUMIN  in the last 168 hours. No results for input(s): LIPASE, AMYLASE in the last 168 hours. No results  for input(s): AMMONIA in the last 168 hours.  ABG    Component Value Date/Time   PHART 7.541 (H) 08/31/2024 1822   PCO2ART 32.4 08/31/2024 1822   PO2ART 231 (H) 08/31/2024 1822   HCO3 27.8 08/31/2024 1822   TCO2 29 08/31/2024 1822   ACIDBASEDEF 7.0 (H) 05/18/2024 2216   O2SAT 100 08/31/2024 1822     Coagulation Profile: No results for input(s): INR, PROTIME in the last 168 hours.  Cardiac Enzymes: No results for input(s): CKTOTAL, CKMB, CKMBINDEX, TROPONINI in the last 168 hours.  HbA1C: Hgb A1c MFr Bld  Date/Time Value Ref Range Status  04/19/2024 01:44 PM 6.1 (H) 4.8 - 5.6 % Final    Comment:    (NOTE) Pre diabetes:          5.7%-6.4%  Diabetes:              >6.4%  Glycemic control for   <  7.0% adults with diabetes   12/18/2023 02:14 AM 5.4 4.8 - 5.6 % Final    Comment:    (NOTE) Pre diabetes:          5.7%-6.4%  Diabetes:              >6.4%  Glycemic control for   <7.0% adults with diabetes     CBG: Recent Labs  Lab 09/01/24 0205 09/01/24 0326 09/01/24 0630 09/01/24 0839 09/01/24 1109  GLUCAP 115* 89 77 95 95      Critical care time: 36    Missy Sandhoff, MD [facility] Internal Medicine Program - PGY-2 09/01/2024, 11:37 AM Pager# [phone]

## 2024-09-01 NOTE — Progress Notes (Signed)
 Initial Nutrition Assessment  DOCUMENTATION CODES:   Underweight, Severe malnutrition in context of chronic illness  INTERVENTION:   - Nepro Shake po TID between meals, each supplement provides 425 kcal and 19 grams protein, pt prefers vanilla flavor  - Magic Cup TID with meals, each supplement provides 290 kcal and 9 grams of protein  - Renal MVI daily  - Agree with Regular diet order  NUTRITION DIAGNOSIS:   Severe Malnutrition related to chronic illness (ESRD, HFrEF) as evidenced by severe fat depletion, severe muscle depletion.  GOAL:   Patient will meet greater than or equal to 90% of their needs  MONITOR:   PO intake, Supplement acceptance, Labs, Weight trends, I & O's  REASON FOR ASSESSMENT:   Consult Assessment of nutrition requirement/status  ASSESSMENT:   43 year old female who presented to the ED on 9/04 with progressive SOB x 1 week despite compliance with HD sessions. 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 Pt admitted with acute hypoxemic respiratory failure secondary to pulmonary edema, acute on chronic heart failure.  9/04 - HD in the ICU but unable to complete full treatment (1.1 L fluid removed), placed on BiPAP, vomited  RD consulted for assessment of nutrition requirements/status. Pt is well-known by RD Team due to multiple recent admissions. Pt with a history of severe malnutrition and continues to meet criteria based on NFPE findings.  Last HD treatment was yesterday with 1.1 L fluid removed. Post-HD weight was 37.6 kg. Reviewed weight history in chart. On 01/27/24, pt's EDW was 45 kg. On 04/18/24, pt's EDW was 49.5 kg. Pt's current EDW is 37.5 kg. This represents a weight loss of 7.5 kg or 16.7% weight from 01/27/24 to 09/01/24 which is  significant for timeframe and consistent with severe malnutrition.  Spoke with pt briefly at bedside. Pt was sleeping soundly but awoke briefly to RD voice and touch. Pt states she ate a little bit for breakfast this morning and prefers vanilla Nepro shakes. Noted an almost completed Nepro shake on bedside table. Pt quick fell back asleep. Will increase frequency of Nepro shakes from BID to TID. Will also add a daily renal MVI. Will add Magic Cup supplements to meal trays. Agree with Regular diet (no restrictions) to allow for the most food choices at mealtimes.  Admit weight: 38.7 kg Current weight: 39.3 kg EDW: 37.5 kg  Medications reviewed and include: Nepro Carb Steady BID between meals, IV abx IVF: D10 @ 20 mL/hr (provides 163 kcal daily from dextrose )  Labs reviewed: chloride 92, phosphorus 6.1, lactic acid 4.0 CBG's: 38-241 x 24 hours  NUTRITION - FOCUSED PHYSICAL EXAM:  Flowsheet Row Most Recent Value  Orbital Region Moderate depletion  Upper Arm Region Severe depletion  Thoracic and Lumbar Region Severe depletion  Buccal Region Moderate depletion  Temple Region Moderate depletion  Clavicle Bone Region Severe depletion  Clavicle and Acromion Bone Region Severe depletion  Scapular Bone Region Severe depletion  Dorsal Hand Severe depletion  Patellar Region Severe depletion  Anterior Thigh Region Severe depletion  Posterior Calf Region Severe depletion  Edema (RD Assessment) None  Hair Unable to assess  [cap on head]  Eyes Reviewed  Mouth Reviewed  Skin Reviewed  Nails Reviewed    Diet Order:   Diet Order  Diet regular Room service appropriate? Yes; Fluid consistency: Thin; Fluid restriction: 1200 mL Fluid  Diet effective now                   EDUCATION NEEDS:   Not appropriate for education at this time  Skin:  Skin Assessment: Reviewed RN Assessment  Last BM:  no documented BM  Height:   Ht Readings from Last 1 Encounters:  08/31/24 5'  2 (1.575 m)    Weight:   Wt Readings from Last 1 Encounters:  09/01/24 39.3 kg    BMI:  Body mass index is 15.85 kg/m.  Estimated Nutritional Needs:   Kcal:  1300-1500  Protein:  65-80 grams  Fluid:  1000 mL + UOP    Mallie Satchel, MS, RD, LDN Registered Dietitian II Please see AMiON for contact information.

## 2024-09-01 NOTE — Progress Notes (Signed)
 [facility] Progress Note  Subjective:    Vitals:   09/01/24 1200 09/01/24 1215 09/01/24 1230 09/01/24 1245  BP: (!) 97/59 (!) 107/55 (!) 112/53 (!) 116/58  Pulse:   (!) 115 (!) 116  Resp: (!) 44 (!) 28 (!) 26 (!) 25  Temp:      TempSrc:      SpO2:   100% 100%  Weight:      Height:        Exam: Gen alert, no distress, thin AAF, mild ^wob Cachectic  No jvd or bruits Chest bibasilar crackles RRR no MRG Abd soft ntnd no mass or ascites +bs Ext no LE edema Neuro is alert, Ox 3 , nf, gen weakness    AVF+bruit    Home bp meds: Midodrine  10 tid    OP HD: [place] [facility] 3h  B400   37.5kg   2K bath  AVF  Heparin  none Post 9/02 wt 38.2kg, usual comes off 0-1.5kg over Hb 12, no esa       Assessment/ Plan: SOB/ pulm edema: per CXR. Not grossly overloaded, on high O2, not in distress. Had HD in ICU yest w/ 1.1 L off. Had to come off at 2hrs due to hypotension and high HR. Have d/w ICU. Will attempt to get some fluid off again today/ tonight w/ iHD + levo support.  ESRD: on HD TTS. HD as above.  Hypotension: midodrine  continuing here Volume: not grossly vol overloaded. Lower vol as tol w/ HD.  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   No results for input(s): IRON , TIBC, FERRITIN in the last 168 hours. Inpatient medications:  amiodarone   200 mg Oral Daily   Chlorhexidine  Gluconate Cloth  6 each Topical Daily   feeding supplement (NEPRO CARB STEADY)  237 mL Oral TID BM   heparin   5,000 Units Subcutaneous Q8H   midodrine   10 mg Oral TID WC    midodrine   10 mg Oral Once in dialysis   multivitamin  1 tablet Oral QHS    sodium chloride  Stopped (08/31/24 2132)   dextrose  20 mL/hr at 09/01/24 0600   norepinephrine  (LEVOPHED ) Adult infusion     acetaminophen , docusate sodium , polyethylene glycol

## 2024-09-01 NOTE — Progress Notes (Signed)
  Echocardiogram 2D Echocardiogram has been performed.  Tinnie FORBES Gosling RDCS 09/01/2024, 1:03 PM

## 2024-09-01 NOTE — Plan of Care (Signed)
  Problem: Clinical Measurements: Goal: Ability to maintain clinical measurements within normal limits will improve Outcome: Progressing Goal: Will remain free from infection Outcome: Progressing Goal: Diagnostic test results will improve Outcome: Progressing Goal: Respiratory complications will improve Outcome: Progressing Goal: Cardiovascular complication will be avoided Outcome: Progressing   Problem: Activity: Goal: Risk for activity intolerance will decrease Outcome: Not Progressing   Problem: Nutrition: Goal: Adequate nutrition will be maintained Outcome: Not Progressing

## 2024-09-02 DIAGNOSIS — I5020 Unspecified systolic (congestive) heart failure: Secondary | ICD-10-CM | POA: Diagnosis not present

## 2024-09-02 DIAGNOSIS — I484 Atypical atrial flutter: Secondary | ICD-10-CM | POA: Diagnosis not present

## 2024-09-02 LAB — BASIC METABOLIC PANEL WITH GFR
Anion gap: 20 — ABNORMAL HIGH (ref 5–15)
BUN: 30 mg/dL — ABNORMAL HIGH (ref 6–20)
CO2: 21 mmol/L — ABNORMAL LOW (ref 22–32)
Calcium: 9.4 mg/dL (ref 8.9–10.3)
Chloride: 91 mmol/L — ABNORMAL LOW (ref 98–111)
Creatinine, Ser: 4.34 mg/dL — ABNORMAL HIGH (ref 0.44–1.00)
GFR, Estimated: 13 mL/min — ABNORMAL LOW (ref >60)
Glucose, Bld: 113 mg/dL — ABNORMAL HIGH (ref 70–99)
Potassium: 4.2 mmol/L (ref 3.5–5.1)
Sodium: 132 mmol/L — ABNORMAL LOW (ref 135–145)

## 2024-09-02 LAB — CBC
HCT: 38.1 % (ref 36.0–46.0)
Hemoglobin: 12.3 g/dL (ref 12.0–15.0)
MCH: 27.5 pg (ref 26.0–34.0)
MCHC: 32.3 g/dL (ref 30.0–36.0)
MCV: 85.2 fL (ref 80.0–100.0)
Platelets: 147 K/uL — ABNORMAL LOW (ref 150–400)
RBC: 4.47 MIL/uL (ref 3.87–5.11)
RDW: 16.6 % — ABNORMAL HIGH (ref 11.5–15.5)
WBC: 7.4 K/uL (ref 4.0–10.5)
nRBC: 0.5 % — ABNORMAL HIGH (ref 0.0–0.2)

## 2024-09-02 LAB — GLUCOSE, CAPILLARY
Glucose-Capillary: 103 mg/dL — ABNORMAL HIGH (ref 70–99)
Glucose-Capillary: 141 mg/dL — ABNORMAL HIGH (ref 70–99)
Glucose-Capillary: 71 mg/dL (ref 70–99)
Glucose-Capillary: 80 mg/dL (ref 70–99)
Glucose-Capillary: 82 mg/dL (ref 70–99)
Glucose-Capillary: 83 mg/dL (ref 70–99)
Glucose-Capillary: 99 mg/dL (ref 70–99)

## 2024-09-02 LAB — LACTIC ACID, PLASMA: Lactic Acid, Venous: 3.9 mmol/L (ref 0.5–1.9)

## 2024-09-02 MED ORDER — SERTRALINE HCL 50 MG PO TABS
25.0000 mg | ORAL_TABLET | Freq: Every day | ORAL | Status: DC
  Administered 2024-09-02 – 2024-09-15 (×13): 25 mg via ORAL
  Filled 2024-09-02 (×14): qty 1

## 2024-09-02 MED ORDER — ALBUMIN HUMAN 25 % IV SOLN
INTRAVENOUS | Status: AC
  Filled 2024-09-02: qty 100

## 2024-09-02 MED ORDER — CHLORHEXIDINE GLUCONATE CLOTH 2 % EX PADS: 6.0000 | MEDICATED_PAD | Freq: Every day | CUTANEOUS | Status: DC

## 2024-09-02 MED ORDER — ALBUMIN HUMAN 25 % IV SOLN: 12.5000 g | INTRAVENOUS | Status: AC | PRN

## 2024-09-02 NOTE — Progress Notes (Addendum)
 NAME:  [name], MRN:  [id number], DOB:  Feb 17, 1981, LOS: 2 ADMISSION DATE:  08/31/2024, CONSULTATION DATE:  08/31/24 REFERRING MD: Dr. 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 Also had decreased appetite and fatigue. Denies fevers, chills. In ED, started on BiPAP. Able to transition to 6L [place]. CXR with pulmonary edema. K 5.3 BNP elevated. Normal WBC. LA 6.2>3.2. Given levaquin . SBP readings low-normal with intermittent readings with SBP in 60s and 70s but mentating well and eating a chicken wrap. PCCM and Nephrology consulted. Plan for hemodialysis in ICU.   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  Interim History / Subjective:  Dialyzed overnight, with removal of 1 L of fluid, received Levophed  for blood pressure control. She is on 7 mcg of Levophed  this morning. Persistent hypoglycemia treated with D50.  Now on increased rate of D10. Weaning of supplemental oxygen, now on room air.  Objective   Blood pressure (!) 100/48, pulse 99, temperature 97.6 F (36.4 C), temperature source Oral, resp. rate (!) 24, height 5' 2 (1.575 m), weight 39.3 kg, SpO2 100%.        Intake/Output Summary (Last 24 hours) at 09/02/2024 0744 Last data filed at 09/02/2024 0600 Gross per 24 hour  Intake 698.76 ml  Output 1000 ml   Net -301.24 ml   Filed Weights   08/31/24 1445 08/31/24 1714 09/01/24 0418  Weight: 38.7 kg 37.6 kg 39.3 kg   Examination: General: Cachectic appearing, not in acute distress. Lungs: Bilateral crackles. Cardiovascular: Regular rhythm, tachycardic.  No murmurs Abdomen: Soft, nontender.  Normal bowel sounds.   Extremities: No edema.  Labs: WBC 6.3>>7.4 BMP: 132, K 4.2 Lactic acid: 4.0>> 3.9 Na 129>>132  Resolved Hospital Problem list   None.  Assessment & Plan:  Acute hypoxic respiratory failure (vs AoC hypoxic resp failure)  Pulmonary edema AHRF secondary to volume overload in the setting of CHF and ESRD. Blood cultures remain negative at 48 hours. Patient is hemodynamically stable on 5 L supplemental oxygen. Elevated lactate likely due to low-output cardiac state and impaired clearance from ESRD. Will recheck lactate following completion of hemodialysis session. Plan:  - Follow lactic acid to resolution.   Chronic heart failure Mitral regurg Tricuspid regurg Aortic regurg  Echocardiogram shows LV systolic function LVEF 30-35% (previously 35% to 40%), severely reduced RV function, mild pulmonary hypertension, severe tricuspid regurgitation, and moderate-severe aortic regurgitation. She is not a candidate for surgical or interventional cardiovascular therapies due to ongoing cocaine  use and overall risk profile.  - Plan is to optimize blood pressure management with midodrine  - Wean norepinephrine  as tolerated.  Atrial fibrillation Rhythm control with amiodarone  chronically.    Not on rate control with metoprolol  as a blood pressure no longer tolerates it. Not on anticoagulation due to hx of subdural hematoma.    Plan: - Cardiac monitoring. - Continue amiodarone  400 mg twice daily  ESRD  Lupus nephritis  AGMA Hyperkalemia Hypocalcemia  Mild hyponatremia, normal K.   - Received HD last night. - Monitor BMP.  IDDM with hypoglycemia  Intermittent hypoglycemia,  stable this morning. Plan: - Continue D10 continuous infusion - Blood sugars Q4hrs. - SSI.   Severe protein calorie malnutrition Unintentional wt loss Physical deconditioning, generalized wknss  P -RDN consult this admission -PT/OT when appropriate   Best Practice (right click and Reselect all SmartList Selections daily)   Diet/type: Regular diet. DVT prophylaxis: systemic dose LMWH GI prophylaxis: N/A Lines: yes and it is still needed Foley:  N/A Code Status:  full code  Labs   CBC: Recent Labs  Lab 08/31/24 0725 08/31/24 0753 08/31/24 1822 09/01/24 0413  WBC 8.7  --   --  6.7  NEUTROABS 5.6  --   --   --   HGB 13.6 16.0* 12.6 12.0  HCT 43.1 47.0* 37.0 36.5  MCV 88.0  --   --  84.3  PLT 191  --   --  164    Basic Metabolic Panel: Recent Labs  Lab 08/31/24 0725 08/31/24 0753 08/31/24 1227 08/31/24 1822 09/01/24 0413  NA 134* 129*  --  132* 135  K 5.5* 5.3*  --  3.8 4.7  CL 91* 101  --   --  92*  CO2 18*  --   --   --  23  GLUCOSE 42* 41*  --   --  81  BUN 49* 55*  --   --  39*  CREATININE 7.17* 7.20* 7.72*  --  5.70*  CALCIUM  9.5  --   --   --  9.9  PHOS  --   --   --   --  6.1*   GFR: Estimated Creatinine Clearance: 8.1 mL/min (A) (by C-G formula based on SCr of 5.7 mg/dL (H)). Recent Labs  Lab 08/31/24 0725 08/31/24 0753 08/31/24 1227 08/31/24 1447 08/31/24 2000 08/31/24 2339 09/01/24 0413  WBC 8.7  --   --   --   --   --  6.7  LATICACIDVEN  --    < > 4.9* 3.2* 4.5* 4.0*  --    < > = values in this interval not displayed.    Liver Function Tests: No results for input(s): AST, ALT, ALKPHOS, BILITOT, PROT, ALBUMIN  in the last 168 hours. No results for input(s): LIPASE, AMYLASE in the last 168 hours. No results for input(s): AMMONIA in the last 168 hours.  ABG    Component Value Date/Time   PHART 7.541 (H) 08/31/2024 1822   PCO2ART 32.4 08/31/2024 1822   PO2ART 231 (H) 08/31/2024 1822   HCO3 27.8 08/31/2024 1822    TCO2 29 08/31/2024 1822   ACIDBASEDEF 7.0 (H) 05/18/2024 2216   O2SAT 100 08/31/2024 1822     Coagulation Profile: Recent Labs  Lab 09/01/24 1554  INR 1.8*    Cardiac Enzymes: No results for input(s): CKTOTAL, CKMB, CKMBINDEX, TROPONINI in the last 168 hours.  HbA1C: Hgb A1c MFr Bld  Date/Time Value Ref Range Status  04/19/2024 01:44 PM 6.1 (H) 4.8 - 5.6 % Final    Comment:    (NOTE) Pre diabetes:          5.7%-6.4%  Diabetes:              >6.4%  Glycemic control for   <7.0% adults with diabetes   12/18/2023 02:14 AM 5.4 4.8 - 5.6 % Final    Comment:    (NOTE)  Pre diabetes:          5.7%-6.4%  Diabetes:              >6.4%  Glycemic control for   <7.0% adults with diabetes     CBG: Recent Labs  Lab 09/01/24 1510 09/01/24 1921 09/01/24 2306 09/02/24 0010 09/02/24 0311  GLUCAP 199* 82 62* 141* 82      Critical care time: 92    Missy Sandhoff, MD [facility] Internal Medicine Program - PGY-2 09/02/2024, 7:44 AM Pager# [phone]

## 2024-09-02 NOTE — Progress Notes (Signed)
 Progress Note  Patient Name: [name] Date of Encounter: 09/02/2024  Primary Cardiologist: Shelda Bruckner, MD  Interval Summary  Chart reviewed including consultation by Dr. Raford yesterday.  Patient is in atrial flutter with largely 2:1 block, heart rate around 110 on oral amiodarone  load.  She is requiring pressors for blood pressure support and attempted hemodialysis with fluid removal.  Vital Signs  Vitals:   09/02/24 0815 09/02/24 0830 09/02/24 0845 09/02/24 0900  BP: (!) 102/57 (!) 105/54 (!) 106/47 (!) 111/48  Pulse:      Resp: (!) 29 (!) 22 (!) 25 (!) 29  Temp:      TempSrc:      SpO2:      Weight:      Height:        Intake/Output Summary (Last 24 hours) at 09/02/2024 0908 Last data filed at 09/02/2024 0600 Gross per 24 hour  Intake 659.13 ml  Output 1000 ml  Net -340.87 ml   Filed Weights   08/31/24 1445 08/31/24 1714 09/01/24 0418  Weight: 38.7 kg 37.6 kg 39.3 kg    Physical Exam  GEN: No acute distress.   Neck: No JVD. Cardiac: Rapid regular rate without gallop. Respiratory: Nonlabored.  Scattered rhonchi anteriorly. GI: Soft, nontender, bowel sounds present. MS: No edema.  ECG/Telemetry  Telemetry reviewed showing atrial flutter with 2:1 block.  Labs  Chemistry Recent Labs  Lab 08/31/24 0725 08/31/24 0753 08/31/24 1227 08/31/24 1822 09/01/24 0413 09/02/24 0817  NA 134* 129*  --  132* 135 132*  K 5.5* 5.3*  --  3.8 4.7 4.2  CL 91* 101  --   --  92* 91*  CO2 18*  --   --   --  23 21*  GLUCOSE 42* 41*  --   --  81 113*  BUN 49* 55*  --   --  39* 30*  CREATININE 7.17* 7.20* 7.72*  --  5.70* 4.34*  CALCIUM  9.5  --   --   --  9.9 9.4  GFRNONAA 7*  --  6*  --  9* 13*  ANIONGAP 25*  --   --   --  20* 20*    Hematology Recent Labs  Lab 08/31/24 0725 08/31/24 0753 08/31/24 1822 09/01/24 0413 09/02/24 0817  WBC 8.7  --   --  6.7 7.4  RBC 4.90  --   --  4.33 4.47  HGB 13.6   < > 12.6 12.0 12.3  HCT 43.1   < > 37.0 36.5  38.1  MCV 88.0  --   --  84.3 85.2  MCH 27.8  --   --  27.7 27.5  MCHC 31.6  --   --  32.9 32.3  RDW 16.9*  --   --  16.2* 16.6*  PLT 191  --   --  164 147*   < > = values in this interval not displayed.   Lipid Panel     Component Value Date/Time   CHOL 58 04/19/2024 1344   TRIG 108 04/19/2024 1344   HDL 14 (L) 04/19/2024 1344   CHOLHDL 4.1 04/19/2024 1344   VLDL 22 04/19/2024 1344   LDLCALC 22 04/19/2024 1344    Cardiac Studies  Echocardiogram 09/01/2024:  1. Left ventricular ejection fraction, by estimation, is 30 to 35%. The  left ventricle has moderately decreased function. The left ventricle  demonstrates global hypokinesis. The left ventricular internal cavity size  was moderately dilated. There is mild  left ventricular hypertrophy. Left ventricular diastolic function could  not be evaluated.   2. Right ventricular systolic function is severely reduced. The right  ventricular size is enlarged. There is mildly elevated pulmonary artery  systolic pressure.   3. Left atrial size was severely dilated.   4. Right atrial size was severely dilated.   5. The mitral valve is degenerative. Moderate mitral valve regurgitation.  No evidence of mitral stenosis.   6. The tricuspid valve is degenerative. Tricuspid valve regurgitation is  severe.   7. The aortic valve is tricuspid. There is moderate calcification of the  aortic valve. There is moderate thickening of the aortic valve. Aortic  valve regurgitation is moderate to severe. No aortic stenosis is present.   8. The inferior vena cava is dilated in size with <50% respiratory  variability, suggesting right atrial pressure of 15 mmHg.   Assessment & Plan  1.  Persistent atrial flutter with RVR, CHA2DS2-VASc score of 3.  She is not a candidate for anticoagulation with prior history of subdural hematomas and recurrent falls.  Currently on amiodarone  400 mg twice daily in an attempt at rate control, additional AV nodal blockers  limited by present hemodynamics.  2.  HFrEF with biventricular heart failure, LVEF 30 to 35% and severe RV dysfunction by recent echocardiogram.  GDMT limited by present hemodynamics.  Chronically on midodrine  for BP support and volume removal via hemodialysis.  3.  Moderate mitral regurgitation.  4.  Moderate to severe aortic regurgitation.  5.  ESRD on hemodialysis.  6.  History of polysubstance abuse.  7.  SLE.  From a cardiac perspective, continue amiodarone  400 mg twice daily and attempt at rate control.  Not candidate for additional AV nodal blockers given present hemodynamics requiring pressors.  GDMT for HFrEF is limited for similar reasons.  Attempting to manage fluid status via hemodialysis.  For questions or updates, please contact [facility] Please consult [email or web address] for contact info under   Signed, Jayson Sierras, MD  09/02/2024, 9:08 AM

## 2024-09-02 NOTE — Progress Notes (Addendum)
 eLink Physician-Brief Progress Note Patient Name: [name] DOB: 1981/11/26 MRN: [id number]   Date of Service  09/02/2024  HPI/Events of Note  Persistent hypoglycemia, CBG 62 treated with D50  eICU Interventions  Increased rate of D10 drip to 30 cc/h   0314 -patient has extremely limited peripheral IV access.  Unable to obtain peripheral IVs with or without ultrasound.  Currently has 1 PIV and is requiring low-dose norepinephrine  while finishing up her dialysis session despite midodrine  p.o.  Suspect will be able to finish her session and wean off nor epi tonight, but will need a discussion about longer-term IV access-trialysis versus CVC versus port given extremely poor vasculature and multiple comorbidities.  Intervention Category Intermediate Interventions: Hyperglycemia - evaluation and treatment  [name] 09/02/2024, 1:52 AM

## 2024-09-02 NOTE — Plan of Care (Signed)

## 2024-09-02 NOTE — Progress Notes (Signed)
 [facility] Progress Note  Subjective:  Seen in room On 7 L [place]  Vitals:   09/02/24 0830 09/02/24 0845 09/02/24 0900 09/02/24 1114  BP: (!) 105/54 (!) 106/47 (!) 111/48   Pulse:      Resp: (!) 22 (!) 25 (!) 29   Temp:    (!) 97.5 F (36.4 C)  TempSrc:    Oral  SpO2:      Weight:      Height:        Exam: Gen alert, no distress, thin AAF, no ^wob Cachectic  No jvd or bruits Chest clear today RRR no MRG Abd soft ntnd no mass or ascites +bs Ext no LE edema Neuro is alert, Ox 3 , nf, gen weakness    AVF+bruit    Home bp meds: Midodrine  10 tid    [facility]: [facility] 3h  B400   37.5kg   2K bath  AVF  Heparin  none Post 9/02 wt 38.2kg, usual comes off 0-1.5kg over Hb 12, no esa       Assessment/ Plan: SOB/ pulm edema: per CXR. Not grossly overloaded, on high O2, not in distress. Had HD in ICU Thursday and Friday, low UF each session d/t low bp's. Plan iHD again today w/ levo / albumin  support.  ESRD: on HD TTS. HD as above.  Hypotension: midodrine  continuing here, also levo gtt Volume: not grossly vol overloaded, 2kg over dry wt, lower vol as tol  Anemia of esrd: Hb 12-15, not on esa at OP unit Chronic systolic HF/  RV dysfunction/ valve disease       Rob Korie Brabson MD  [facility] 09/02/2024, 11:20 AM  Recent Labs  Lab 09/01/24 0413 09/02/24 0817  HGB 12.0 12.3  CALCIUM  9.9 9.4  PHOS 6.1*  --   CREATININE 5.70* 4.34*  K 4.7 4.2   No results for input(s): IRON , TIBC, FERRITIN in the last 168 hours. Inpatient medications:  amiodarone   400 mg Oral BID   Chlorhexidine  Gluconate Cloth  6 each Topical Daily   Chlorhexidine  Gluconate Cloth  6 each Topical Q0600   [START ON 09/03/2024] Chlorhexidine  Gluconate Cloth  6 each Topical Q0600   feeding supplement (NEPRO CARB STEADY)  237 mL Oral TID BM   heparin   5,000 Units Subcutaneous Q8H   midodrine   10 mg Oral TID WC   multivitamin  1 tablet Oral QHS   mupirocin  ointment   Nasal BID    dextrose  30  mL/hr at 09/02/24 0900   norepinephrine  (LEVOPHED ) Adult infusion 2 mcg/min (09/02/24 0900)   acetaminophen , docusate sodium , polyethylene glycol

## 2024-09-03 ENCOUNTER — Inpatient Hospital Stay (HOSPITAL_COMMUNITY)

## 2024-09-03 DIAGNOSIS — I484 Atypical atrial flutter: Secondary | ICD-10-CM | POA: Diagnosis not present

## 2024-09-03 LAB — CBC
HCT: 39.6 % (ref 36.0–46.0)
Hemoglobin: 13.1 g/dL (ref 12.0–15.0)
MCH: 27.6 pg (ref 26.0–34.0)
MCHC: 33.1 g/dL (ref 30.0–36.0)
MCV: 83.4 fL (ref 80.0–100.0)
Platelets: 159 K/uL (ref 150–400)
RBC: 4.75 MIL/uL (ref 3.87–5.11)
RDW: 16.8 % — ABNORMAL HIGH (ref 11.5–15.5)
WBC: 7 K/uL (ref 4.0–10.5)
nRBC: 0.9 % — ABNORMAL HIGH (ref 0.0–0.2)

## 2024-09-03 LAB — GLUCOSE, CAPILLARY
Glucose-Capillary: 103 mg/dL — ABNORMAL HIGH (ref 70–99)
Glucose-Capillary: 104 mg/dL — ABNORMAL HIGH (ref 70–99)
Glucose-Capillary: 113 mg/dL — ABNORMAL HIGH (ref 70–99)
Glucose-Capillary: 61 mg/dL — ABNORMAL LOW (ref 70–99)
Glucose-Capillary: 84 mg/dL (ref 70–99)
Glucose-Capillary: 87 mg/dL (ref 70–99)
Glucose-Capillary: 99 mg/dL (ref 70–99)

## 2024-09-03 LAB — BASIC METABOLIC PANEL WITH GFR
Anion gap: 16 — ABNORMAL HIGH (ref 5–15)
BUN: 24 mg/dL — ABNORMAL HIGH (ref 6–20)
CO2: 24 mmol/L (ref 22–32)
Calcium: 9.7 mg/dL (ref 8.9–10.3)
Chloride: 92 mmol/L — ABNORMAL LOW (ref 98–111)
Creatinine, Ser: 3.62 mg/dL — ABNORMAL HIGH (ref 0.44–1.00)
GFR, Estimated: 16 mL/min — ABNORMAL LOW (ref >60)
Glucose, Bld: 96 mg/dL (ref 70–99)
Potassium: 3.8 mmol/L (ref 3.5–5.1)
Sodium: 132 mmol/L — ABNORMAL LOW (ref 135–145)

## 2024-09-03 MED ORDER — ORAL CARE MOUTH RINSE
15.0000 mL | OROMUCOSAL | Status: DC | PRN
Start: 2024-09-03 — End: 2024-09-11

## 2024-09-03 MED ORDER — ONDANSETRON HCL 4 MG/2ML IJ SOLN
4.0000 mg | Freq: Once | INTRAMUSCULAR | Status: AC
  Administered 2024-09-03: 4 mg via INTRAVENOUS
  Filled 2024-09-03: qty 2

## 2024-09-03 MED ORDER — ORAL CARE MOUTH RINSE
15.0000 mL | OROMUCOSAL | Status: DC
  Administered 2024-09-03 – 2024-09-11 (×19): 15 mL via OROMUCOSAL

## 2024-09-03 MED ORDER — CHLORHEXIDINE GLUCONATE CLOTH 2 % EX PADS
6.0000 | MEDICATED_PAD | Freq: Every day | CUTANEOUS | Status: DC
  Administered 2024-09-03 – 2024-09-05 (×3): 6 via TOPICAL

## 2024-09-03 NOTE — Progress Notes (Addendum)
 [facility] Progress Note   Subjective:    Seen and examined patient at bedside in the ICU. She remains on Levo drip and nursing is trying to wean down. 2L was removed with yesterday's HD. Appears O2 is slowly being weaned down-now at 3L [place]. Plan for HD tomorrow.  Objective Vitals:   09/03/24 1015 09/03/24 1030 09/03/24 1045 09/03/24 1100  BP: (!) 100/51 (!) 92/47 (!) 96/45 (!) 89/76  Pulse: 94  82 92  Resp: 19 (!) 27 (!) 28 (!) 24  Temp:      TempSrc:      SpO2:      Weight:      Height:       Physical Exam General: Awake, alert, NAD, cachectic, ill-appearing Heart: RRR; No MRGs Lungs: Diminished b/l lower lobes Abdomen: Soft and non-tender Extremities: No LE edema Dialysis Access: AVF   Filed Weights   09/01/24 0418 09/02/24 1500 09/02/24 1900  Weight: 39.3 kg 39.3 kg 37.1 kg    Intake/Output Summary (Last 24 hours) at 09/03/2024 1133 Last data filed at 09/03/2024 1100 Gross per 24 hour  Intake 942.27 ml  Output 2000 ml  Net -1057.73 ml    Additional Objective Labs: Basic Metabolic Panel: Recent Labs  Lab 09/01/24 0413 09/02/24 0817 09/03/24 0236  NA 135 132* 132*  K 4.7 4.2 3.8  CL 92* 91* 92*  CO2 23 21* 24  GLUCOSE 81 113* 96  BUN 39* 30* 24*  CREATININE 5.70* 4.34* 3.62*  CALCIUM  9.9 9.4 9.7  PHOS 6.1*  --   --    Liver Function Tests: No results for input(s): AST, ALT, ALKPHOS, BILITOT, PROT, ALBUMIN  in the last 168 hours. No results for input(s): LIPASE, AMYLASE in the last 168 hours. CBC: Recent Labs  Lab 08/31/24 0725 08/31/24 0753 09/01/24 0413 09/02/24 0817 09/03/24 0236  WBC 8.7  --  6.7 7.4 7.0  NEUTROABS 5.6  --   --   --   --   HGB 13.6   < > 12.0 12.3 13.1  HCT 43.1   < > 36.5 38.1 39.6  MCV 88.0  --  84.3 85.2 83.4  PLT 191  --  164 147* 159   < > = values in this interval not displayed.   Blood Culture    Component Value Date/Time   SDES BLOOD SITE NOT SPECIFIED 08/31/2024 1223   SDES BLOOD  SITE NOT SPECIFIED 08/31/2024 1223   SPECREQUEST  08/31/2024 1223    BOTTLES DRAWN AEROBIC ONLY Blood Culture results may not be optimal due to an inadequate volume of blood received in culture bottles   SPECREQUEST  08/31/2024 1223    BOTTLES DRAWN AEROBIC ONLY Blood Culture adequate volume   CULT  08/31/2024 1223    NO GROWTH 3 DAYS Performed at [facility], [address], [place], [place] [address]    CULT  08/31/2024 1223    NO GROWTH 3 DAYS Performed at [facility], [address] [place], [place] [address]    REPTSTATUS PENDING 08/31/2024 1223   REPTSTATUS PENDING 08/31/2024 1223    Cardiac Enzymes: No results for input(s): CKTOTAL, CKMB, CKMBINDEX, TROPONINI in the last 168 hours. CBG: Recent Labs  Lab 09/02/24 2327 09/03/24 0309 09/03/24 0734 09/03/24 0916 09/03/24 1107  GLUCAP 83 87 61* 104* 103*   Iron  Studies: No results for input(s): IRON , TIBC, TRANSFERRIN, FERRITIN in the last 72 hours. Lab Results  Component Value Date   INR 1.8 (  H) 09/01/2024   INR 1.4 (H) 12/16/2023   INR 1.5 (H) 12/13/2023   Studies/Results: DG CHEST PORT 1 VIEW Result Date: 09/03/2024 CLINICAL DATA:  Hypoxemia. EXAM: PORTABLE CHEST 1 VIEW COMPARISON:  August 31, 2024. FINDINGS: Stable cardiomegaly. Stable diffuse interstitial and airspace opacities are noted concerning for pulmonary edema or diffuse atypical infection. Old right rib fracture is noted. Possible small left pleural effusion. IMPRESSION: Stable diffuse interstitial and airspace opacities are noted concerning for pulmonary edema or diffuse atypical infection. Electronically Signed   By: Lynwood Landy Raddle M.D.   On: 09/03/2024 10:52   ECHOCARDIOGRAM COMPLETE Result Date: 09/01/2024    ECHOCARDIOGRAM REPORT   Patient Name:   TANISHKA DROLET Date of Exam: 09/01/2024 Medical Rec #:  [id number]      Height:       62.0 in Accession #:    [id number]     Weight:       86.6 lb Date of Birth:  [birth year]     BSA:           1.340 m Patient Age:    42 years       BP:           112/53 mmHg Patient Gender: F              HR:           116 bpm. Exam Location:  Inpatient Procedure: 2D Echo, Color Doppler and Cardiac Doppler (Both Spectral and Color            Flow Doppler were utilized during procedure). Indications:    CHF I50.9  History:        Patient has prior history of Echocardiogram examinations, most                 recent 05/19/2024. CHF; Arrythmias:Atrial Fibrillation.  Sonographer:    Tinnie Gosling RDCS Referring Phys: MISSY SANDHOFF IMPRESSIONS  1. Left ventricular ejection fraction, by estimation, is 30 to 35%. The left ventricle has moderately decreased function. The left ventricle demonstrates global hypokinesis. The left ventricular internal cavity size was moderately dilated. There is mild  left ventricular hypertrophy. Left ventricular diastolic function could not be evaluated.  2. Right ventricular systolic function is severely reduced. The right ventricular size is enlarged. There is mildly elevated pulmonary artery systolic pressure.  3. Left atrial size was severely dilated.  4. Right atrial size was severely dilated.  5. The mitral valve is degenerative. Moderate mitral valve regurgitation. No evidence of mitral stenosis.  6. The tricuspid valve is degenerative. Tricuspid valve regurgitation is severe.  7. The aortic valve is tricuspid. There is moderate calcification of the aortic valve. There is moderate thickening of the aortic valve. Aortic valve regurgitation is moderate to severe. No aortic stenosis is present.  8. The inferior vena cava is dilated in size with <50% respiratory variability, suggesting right atrial pressure of 15 mmHg. Comparison(s): A prior study was performed on 05/19/2024. Conclusion(s)/Recommendation(s): Consider workup for cardiomyopathy ddx includes but not limited too: non-compaction (see apical two chamber), infiltrative process (i.e. cardiac amyloidosis, etc), clinical  correlation required. FINDINGS  Left Ventricle: Left ventricular ejection fraction, by estimation, is 30 to 35%. The left ventricle has moderately decreased function. The left ventricle demonstrates global hypokinesis. The left ventricular internal cavity size was moderately dilated. There is mild left ventricular hypertrophy. Left ventricular diastolic function could not be evaluated due to nondiagnostic images. Left ventricular diastolic function could not be evaluated. Right  Ventricle: The right ventricular size is enlarged. Right vetricular wall thickness was not well visualized. Right ventricular systolic function is severely reduced. There is mildly elevated pulmonary artery systolic pressure. The tricuspid regurgitant velocity is 2.71 m/s, and with an assumed right atrial pressure of 15 mmHg, the estimated right ventricular systolic pressure is 44.4 mmHg. Left Atrium: Left atrial size was severely dilated. Right Atrium: Right atrial size was severely dilated. Pericardium: There is no evidence of pericardial effusion. Mitral Valve: The mitral valve is degenerative in appearance. Moderate mitral valve regurgitation. No evidence of mitral valve stenosis. Tricuspid Valve: The tricuspid valve is degenerative in appearance. Tricuspid valve regurgitation is severe. No evidence of tricuspid stenosis. Aortic Valve: The aortic valve is tricuspid. There is moderate calcification of the aortic valve. There is moderate thickening of the aortic valve. There is moderate to severe aortic valve annular calcification. Aortic valve regurgitation is moderate to severe. No aortic stenosis is present. Aortic valve mean gradient measures 6.5 mmHg. Aortic valve peak gradient measures 12.0 mmHg. Aortic valve area, by VTI measures 2.15 cm. Pulmonic Valve: The pulmonic valve was grossly normal. Pulmonic valve regurgitation is mild to moderate. No evidence of pulmonic stenosis. Aorta: The aortic root and ascending aorta are  structurally normal, with no evidence of dilitation. Venous: The inferior vena cava is dilated in size with less than 50% respiratory variability, suggesting right atrial pressure of 15 mmHg. IAS/Shunts: The interatrial septum was not well visualized.  LEFT VENTRICLE PLAX 2D LVIDd:         5.70 cm LVIDs:         4.70 cm LV PW:         1.20 cm LV IVS:        1.20 cm LVOT diam:     2.00 cm LV SV:         54 LV SV Index:   40 LVOT Area:     3.14 cm  RIGHT VENTRICLE            IVC RV S prime:     6.20 cm/s  IVC diam: 2.30 cm TAPSE (M-mode): 0.8 cm LEFT ATRIUM              Index         RIGHT ATRIUM           Index LA diam:        4.70 cm  3.51 cm/m    RA Area:     24.70 cm LA Vol (A2C):   139.0 ml 103.75 ml/m  RA Volume:   87.80 ml  65.54 ml/m LA Vol (A4C):   101.0 ml 75.39 ml/m LA Biplane Vol: 119.0 ml 88.83 ml/m  AORTIC VALVE AV Area (Vmax):    2.06 cm AV Area (Vmean):   2.18 cm AV Area (VTI):     2.15 cm AV Vmax:           173.50 cm/s AV Vmean:          118.000 cm/s AV VTI:            0.250 m AV Peak Grad:      12.0 mmHg AV Mean Grad:      6.5 mmHg LVOT Vmax:         114.00 cm/s LVOT Vmean:        81.800 cm/s LVOT VTI:          0.171 m LVOT/AV VTI ratio: 0.69  AORTA Ao Root diam: 3.10 cm Ao  Asc diam:  2.90 cm TRICUSPID VALVE TR Peak grad:   29.4 mmHg TR Vmax:        271.00 cm/s  SHUNTS Systemic VTI:  0.17 m Systemic Diam: 2.00 cm Sunit Tolia Electronically signed by Madonna Large Signature Date/Time: 09/01/2024/1:57:26 PM    Final     Medications:  dextrose  30 mL/hr at 09/03/24 1100   norepinephrine  (LEVOPHED ) Adult infusion Stopped (09/03/24 1054)    amiodarone   400 mg Oral BID   Chlorhexidine  Gluconate Cloth  6 each Topical Daily   Chlorhexidine  Gluconate Cloth  6 each Topical Q0600   Chlorhexidine  Gluconate Cloth  6 each Topical Q0600   feeding supplement (NEPRO CARB STEADY)  237 mL Oral TID BM   heparin   5,000 Units Subcutaneous Q8H   midodrine   10 mg Oral TID WC   multivitamin  1 tablet Oral  QHS   mupirocin  ointment   Nasal BID   mouth rinse  15 mL Mouth Rinse 4 times per day   sertraline   25 mg Oral Daily    Dialysis Orders: [facility]  B400   37.5kg   2K bath  AVF  Heparin  none Post 9/02 wt 38.2kg, usual comes off 0-1.5kg over Hb 12, no esa  Home bp meds: Midodrine  10 tid  Assessment/Plan: SOB/ pulm edema: per CXR. Not grossly overloaded, O2appears to be slowly weaning down, not in distress. Had HD in ICU Friday and Saturday this week, low UF each session d/t low bp's. On Midodrine  TID and IV Levo. Using IV Albumin  for additional BP support.  ESRD: on HD TTS. 2L removed yesterday. Plan for dialysis tomorrow then resume TTS schedule. 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

## 2024-09-03 NOTE — Progress Notes (Signed)
 Progress Note  Patient Name: [name] Date of Encounter: 09/03/2024  Primary Cardiologist: Shelda Bruckner, MD  Interval Summary  Tolerated HD yesterday with removal of approximately 2 L.  She remains in atrial flutter, heart rate controlled improved overall.  Vital Signs  Vitals:   09/03/24 0300 09/03/24 0315 09/03/24 0515 09/03/24 0736  BP:  (!) 91/55 (!) 98/51   Pulse:  98    Resp:  (!) 24 (!) 37   Temp: (!) 97.3 F (36.3 C)   (!) 97.5 F (36.4 C)  TempSrc: Axillary   Oral  SpO2:  99%    Weight:      Height:        Intake/Output Summary (Last 24 hours) at 09/03/2024 0821 Last data filed at 09/03/2024 0600 Gross per 24 hour  Intake 867.3 ml  Output 2000 ml  Net -1132.7 ml   Filed Weights   09/01/24 0418 09/02/24 1500 09/02/24 1900  Weight: 39.3 kg 39.3 kg 37.1 kg    Physical Exam  GEN: No acute distress.   Neck: No JVD. Cardiac: Rapid regular rate without gallop. Respiratory: Nonlabored.  Scattered rhonchi anteriorly. GI: Soft, nontender, bowel sounds present. MS: No edema.  ECG/Telemetry  Telemetry reviewed showing atrial flutter with 2:1 block, heart rate in the 90s.  Labs  Chemistry Recent Labs  Lab 09/01/24 0413 09/02/24 0817 09/03/24 0236  NA 135 132* 132*  K 4.7 4.2 3.8  CL 92* 91* 92*  CO2 23 21* 24  GLUCOSE 81 113* 96  BUN 39* 30* 24*  CREATININE 5.70* 4.34* 3.62*  CALCIUM  9.9 9.4 9.7  GFRNONAA 9* 13* 16*  ANIONGAP 20* 20* 16*    Hematology Recent Labs  Lab 09/01/24 0413 09/02/24 0817 09/03/24 0236  WBC 6.7 7.4 7.0  RBC 4.33 4.47 4.75  HGB 12.0 12.3 13.1  HCT 36.5 38.1 39.6  MCV 84.3 85.2 83.4  MCH 27.7 27.5 27.6  MCHC 32.9 32.3 33.1  RDW 16.2* 16.6* 16.8*  PLT 164 147* 159   Lipid Panel     Component Value Date/Time   CHOL 58 04/19/2024 1344   TRIG 108 04/19/2024 1344   HDL 14 (L) 04/19/2024 1344   CHOLHDL 4.1 04/19/2024 1344   VLDL 22 04/19/2024 1344   LDLCALC 22 04/19/2024 1344    Cardiac  Studies  Echocardiogram 09/01/2024:  1. Left ventricular ejection fraction, by estimation, is 30 to 35%. The  left ventricle has moderately decreased function. The left ventricle  demonstrates global hypokinesis. The left ventricular internal cavity size  was moderately dilated. There is mild   left ventricular hypertrophy. Left ventricular diastolic function could  not be evaluated.   2. Right ventricular systolic function is severely reduced. The right  ventricular size is enlarged. There is mildly elevated pulmonary artery  systolic pressure.   3. Left atrial size was severely dilated.   4. Right atrial size was severely dilated.   5. The mitral valve is degenerative. Moderate mitral valve regurgitation.  No evidence of mitral stenosis.   6. The tricuspid valve is degenerative. Tricuspid valve regurgitation is  severe.   7. The aortic valve is tricuspid. There is moderate calcification of the  aortic valve. There is moderate thickening of the aortic valve. Aortic  valve regurgitation is moderate to severe. No aortic stenosis is present.   8. The inferior vena cava is dilated in size with <50% respiratory  variability, suggesting right atrial pressure of 15 mmHg.   Assessment & Plan  1.  Persistent atrial flutter with RVR, CHA2DS2-VASc score of 3.  She is not a candidate for anticoagulation with prior history of subdural hematomas and recurrent falls.  Currently on amiodarone  400 mg twice daily in an attempt at rate control, additional AV nodal blockers limited by present hemodynamics.  2.  HFrEF with biventricular heart failure, LVEF 30 to 35% and severe RV dysfunction by recent echocardiogram.  GDMT limited by present hemodynamics.  Chronically on midodrine  for BP support and volume removal via hemodialysis.  3.  Moderate mitral regurgitation.  4.  Moderate to severe aortic regurgitation.  5.  ESRD on hemodialysis.  6.  History of polysubstance abuse.  7.  SLE.  Continue  amiodarone  400 mg twice daily, recent heart rates in the 90s.  Not candidate for additional AV nodal blockers given present hemodynamics requiring pressors.  GDMT for HFrEF is limited for similar reasons.  Attempting to manage fluid status via hemodialysis.  For questions or updates, please contact [facility] Please consult [email or web address] for contact info under   Signed, Jayson Sierras, MD  09/03/2024, 8:21 AM

## 2024-09-03 NOTE — Progress Notes (Signed)
 NAME:  [name], MRN:  [id number], DOB:  [birth year], LOS: 3 ADMISSION DATE:  08/31/2024, CONSULTATION DATE:  08/31/24 REFERRING MD: Dr. 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 Also had decreased appetite and fatigue. Denies fevers, chills. In ED, started on BiPAP. Able to transition to 6L [place]. CXR with pulmonary edema. K 5.3 BNP elevated. Normal WBC. LA 6.2>3.2. Given levaquin . SBP readings low-normal with intermittent readings with SBP in 60s and 70s but mentating well and eating a chicken wrap. PCCM and Nephrology consulted. Plan for hemodialysis in ICU.   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 HD 1.1L 9/5: HD removed 1 with levophed  9/6: HD removed 2L with levophed  9/7: Weaning pressors Interim History / Subjective:  On levophed  3 Tolerated dialysis with 2L removed yesterday Improved shortness of breath Objective   Blood pressure (!) 88/75, pulse (!) 128, temperature (!) 97.5 F (36.4 C), temperature source Oral, resp. rate (!) 30, height 5' 2 (1.575 m), weight 37.1 kg, SpO2 94%.        Intake/Output Summary (Last 24 hours) at 09/03/2024 0911 Last data filed at 09/03/2024 0600 Gross per 24 hour  Intake 829.87 ml  Output 2000 ml  Net -1170.13 ml   Filed Weights   09/01/24 0418 09/02/24 1500  09/02/24 1900  Weight: 39.3 kg 39.3 kg 37.1 kg   Physical Exam: General: Cachectic, chronically ill-appearing, no acute distress HENT: [place], AT, OP clear, MMM Eyes: EOMI, no scleral icterus Respiratory: Clear to auscultation bilaterally.  No crackles, wheezing or rales Cardiovascular: RRR, -M/R/G, no JVD GI: BS+, soft, nontender Extremities:-Edema,-tenderness Neuro: AAO x4, CNII-XII grossly intact  Imaging, labs and test in EMR in the last 24 hours reviewed independently by me. Pertinent findings below: BMET acceptable. C/w ESRD  Resolved Hospital Problem list   None.  Assessment & Plan:  Acute hypoxic respiratory failure (vs AoC hypoxic resp failure) - clinically improving. Difficulty obtaining saturations Pulmonary edema Pneumonia -ruled out. Absence of fever/WBC/respiratory sx AHRF secondary to volume overload in the setting of CHF and ESRD. Blood cultures remain negative at 48 hours. Patient is hemodynamically stable on 5 L supplemental oxygen. Elevated lactate likely due to low-output cardiac state and impaired clearance from ESRD. Will recheck lactate following completion of hemodialysis session. Plan:  - Wean supplemental O2 for goal >88%. Obtaining good waveforms on SpO2 monitor remains a barrier to weaning but patient is clinically improving from respiratory standpoint - Trend LA - CXR   Acute on Chronic heart failure Mitral regurg Tricuspid regurg Aortic regurg  Echocardiogram shows LV systolic function LVEF 30-35% (previously 35% to 40%), severely reduced RV function, mild pulmonary hypertension, severe tricuspid regurgitation, and moderate-severe aortic regurgitation. She is not a candidate for surgical or interventional cardiovascular therapies due to ongoing cocaine  use and overall risk profile.  - Appreciate cardiology input - Volume removal via dialysis - Wean norepinephrine  to  support volme removal  Atrial fibrillation Rhythm control with amiodarone  chronically.     Not on rate control with metoprolol  as a blood pressure no longer tolerates it. Not on anticoagulation due to hx of subdural hematoma.    Plan: - Cardiac monitoring. - Continue amiodarone  400 mg twice daily   ESRD  Lupus nephritis  AGMA Hyperkalemia Hypocalcemia  S/p HD 9/4-9/6 - Nephrology following. Hold on dialysis today  IDDM with hypoglycemia  Intermittent hypoglycemia, stable this morning. Plan: - Continue D10 continuous infusion - Blood sugars Q4hrs. - SSI.   Severe protein calorie malnutrition Unintentional wt loss Physical deconditioning, generalized wknss  P -RDN consult this admission -PT/OT when appropriate   Best Practice (right click and Reselect all SmartList Selections daily)   Diet/type: Regular diet. DVT prophylaxis: prophylactic heparin   GI prophylaxis: N/A Lines: N/A Foley:  N/A Code Status:  full code  Critical care time: 30 min     The patient is critically ill with hypotension and respiratory failure and requires high complexity decision making for assessment and support, frequent evaluation and titration of therapies, application of advanced monitoring technologies and extensive interpretation of multiple databases.  Independent Critical Care Time: 30 Minutes.   Slater Staff, M.D. [facility] Pulmonary/Critical Care Medicine 09/03/2024 9:11 AM   Please see Amion for pager number to reach on-call Pulmonary and Critical Care Team.

## 2024-09-03 NOTE — Progress Notes (Addendum)
 RN unable to get accurate waveform on SpO2 monitor. MD made aware. RN asked if MD wanted to get blood gas for a more accurate O2 reading; however, since patient is only on 3L [place], MD did not want to get blood gas. All extremities, both earlobes & forehead attempted for O2 reading - none of which picking up throughout shift. Overall, patient reporting breathing status is better than days before so patient remaining on 3L [place].  RN and NT also having difficulty getting temperature. All extremities feel cool to touch but patient reporting being hot. Patient with temp of 92.9 orally. Patient refused rectal temp. Multiple blankets applied to patient and education provided on need for bair hugger if temp does not come up. Oral temp rechecked later reading 95.4. Patient reporting being hot and pushing blankets off. Patient educated again on importance of getting core temp up. Patient refusing bair hugger at this time. Temp in room increased to 80 degrees.

## 2024-09-03 NOTE — Evaluation (Signed)
 Physical Therapy Evaluation Patient Details Name: [name] MRN: [id number] DOB: 04/01/1981 Today's Date: 09/03/2024  History of Present Illness  Pt is a 43 y.o. female admitted 9/4 for acute respiratory failure with hypoxia. 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 PTA pt lived in 2nd floor apartment with her mother, mod I in home without AD but required assist on stairs. Pt currently with functional limitations due to the deficits listed below (see PT Problem List). On eval, pt required supervision bed mobility, min assist sit to stand, and min assist sidestepping bedside. Mobility limited by fatigue and weakness. Pt on 3L O2 but unable to obtain a reliable SpO2 reading. Pt will benefit from acute skilled PT to increase their independence and safety with mobility to allow discharge. PT to follow acutely. No follow up services indicated. Recommend rollator for home.         If plan is discharge home, recommend the following: A little help with walking and/or transfers;A little help with bathing/dressing/bathroom;Assistance with cooking/housework;Help with stairs or ramp for entrance;Assist for transportation   Can travel by private vehicle        Equipment Recommendations Rollator (4 wheels)  Recommendations for Other Services       Functional Status Assessment Patient has had a recent decline in their functional status and demonstrates the ability to make significant improvements in function in a reasonable and predictable amount of time.     Precautions / Restrictions Precautions Precautions: Fall;Other (comment) Recall of Precautions/Restrictions: Intact Precaution/Restrictions Comments: watch sats      Mobility  Bed Mobility Overal bed mobility: Needs Assistance Bed Mobility:  Supine to Sit, Sit to Supine     Supine to sit: Supervision, HOB elevated Sit to supine: Supervision, HOB elevated        Transfers Overall transfer level: Needs assistance Equipment used: 1 person hand held assist Transfers: Sit to/from Stand Sit to Stand: Min assist                Ambulation/Gait Ambulation/Gait assistance: Min assist             General Gait Details: min assist sidestepping bedside. Pt declining further gait progression due to fatigue.  Stairs            Wheelchair Mobility     Tilt Bed    Modified Rankin (Stroke Patients Only)       Balance Overall balance assessment: Mild deficits observed, not formally tested                                           Pertinent Vitals/Pain Pain Assessment Pain Assessment: Faces Faces Pain Scale: Hurts little more Pain Location: BLE Pain Descriptors / Indicators: Aching Pain Intervention(s): Monitored during session    Home Living Family/patient expects to be discharged to:: Private residence Living Arrangements: Parent Available Help at Discharge: Family;Available PRN/intermittently Type of Home: Apartment Home Access: Stairs to enter Entrance Stairs-Rails: Right;Left;Can reach both Entrance Stairs-Number of Steps: 16   Home Layout: One level Home Equipment: Shower seat      Prior Function Prior Level of Function : Independent/Modified Independent             Mobility Comments:  does not use DME ADLs Comments: fatigues at quickly, but can do everything for herself     Extremity/Trunk Assessment   Upper Extremity Assessment Upper Extremity Assessment: Generalized weakness    Lower Extremity Assessment Lower Extremity Assessment: Generalized weakness    Cervical / Trunk Assessment Cervical / Trunk Assessment: Kyphotic  Communication   Communication Communication: No apparent difficulties    Cognition Arousal: Alert Behavior During Therapy: WFL  for tasks assessed/performed   PT - Cognitive impairments: No apparent impairments                         Following commands: Intact       Cueing Cueing Techniques: Verbal cues     General Comments General comments (skin integrity, edema, etc.): Pt on 3L O2. Unable to get reliable SpO2, very erratic pleth line.    Exercises     Assessment/Plan    PT Assessment Patient needs continued PT services  PT Problem List Decreased strength;Cardiopulmonary status limiting activity;Pain;Decreased activity tolerance;Decreased balance;Decreased mobility       PT Treatment Interventions DME instruction;Therapeutic exercise;Gait training;Balance training;Stair training;Functional mobility training;Therapeutic activities;Patient/family education    PT Goals (Current goals can be found in the Care Plan section)  Acute Rehab PT Goals Patient Stated Goal: home PT Goal Formulation: With patient Time For Goal Achievement: 09/17/24 Potential to Achieve Goals: Fair    Frequency Min 2X/week     Co-evaluation               AM-PAC PT 6 Clicks Mobility  Outcome Measure Help needed turning from your back to your side while in a flat bed without using bedrails?: None Help needed moving from lying on your back to sitting on the side of a flat bed without using bedrails?: A Little Help needed moving to and from a bed to a chair (including a wheelchair)?: A Little Help needed standing up from a chair using your arms (e.g., wheelchair or bedside chair)?: A Little Help needed to walk in hospital room?: A Little Help needed climbing 3-5 steps with a railing? : A Lot 6 Click Score: 18    End of Session Equipment Utilized During Treatment: Oxygen Activity Tolerance: Patient limited by fatigue Patient left: in bed;with call bell/phone within reach;with family/visitor present Nurse Communication: Mobility status PT Visit Diagnosis: Muscle weakness (generalized) (M62.81)    Time:  8669-8647 PT Time Calculation (min) (ACUTE ONLY): 22 min   Charges:   PT Evaluation $PT Eval Moderate Complexity: 1 Mod   PT General Charges $$ ACUTE PT VISIT: 1 Visit         Sari MATSU., PT  Office # [phone]   Erven Sari Shaker 09/03/2024, 3:04 PM

## 2024-09-04 DIAGNOSIS — I4892 Unspecified atrial flutter: Secondary | ICD-10-CM

## 2024-09-04 DIAGNOSIS — Z7189 Other specified counseling: Secondary | ICD-10-CM | POA: Diagnosis not present

## 2024-09-04 DIAGNOSIS — Z515 Encounter for palliative care: Secondary | ICD-10-CM | POA: Diagnosis not present

## 2024-09-04 DIAGNOSIS — E872 Acidosis, unspecified: Secondary | ICD-10-CM | POA: Diagnosis not present

## 2024-09-04 DIAGNOSIS — J810 Acute pulmonary edema: Secondary | ICD-10-CM

## 2024-09-04 DIAGNOSIS — I50812 Chronic right heart failure: Secondary | ICD-10-CM | POA: Diagnosis not present

## 2024-09-04 DIAGNOSIS — I9589 Other hypotension: Secondary | ICD-10-CM | POA: Diagnosis not present

## 2024-09-04 DIAGNOSIS — T82838A Hemorrhage of vascular prosthetic devices, implants and grafts, initial encounter: Secondary | ICD-10-CM

## 2024-09-04 DIAGNOSIS — J9601 Acute respiratory failure with hypoxia: Secondary | ICD-10-CM

## 2024-09-04 DIAGNOSIS — I5043 Acute on chronic combined systolic (congestive) and diastolic (congestive) heart failure: Secondary | ICD-10-CM | POA: Diagnosis not present

## 2024-09-04 LAB — LACTIC ACID, PLASMA: Lactic Acid, Venous: 3.2 mmol/L (ref 0.5–1.9)

## 2024-09-04 LAB — GLUCOSE, CAPILLARY
Glucose-Capillary: 73 mg/dL (ref 70–99)
Glucose-Capillary: 110 mg/dL — ABNORMAL HIGH (ref 70–99)
Glucose-Capillary: 69 mg/dL — ABNORMAL LOW (ref 70–99)
Glucose-Capillary: 69 mg/dL — ABNORMAL LOW (ref 70–99)
Glucose-Capillary: 114 mg/dL — ABNORMAL HIGH (ref 70–99)
Glucose-Capillary: 132 mg/dL — ABNORMAL HIGH (ref 70–99)
Glucose-Capillary: 71 mg/dL (ref 70–99)
Glucose-Capillary: 67 mg/dL — ABNORMAL LOW (ref 70–99)
Glucose-Capillary: 128 mg/dL — ABNORMAL HIGH (ref 70–99)

## 2024-09-04 MED ORDER — PENTAFLUOROPROP-TETRAFLUOROETH EX AERO: 1.0000 | INHALATION_SPRAY | CUTANEOUS | Status: DC | PRN

## 2024-09-04 MED ORDER — LIDOCAINE HCL (PF) 1 % IJ SOLN
5.0000 mL | INTRAMUSCULAR | Status: DC | PRN
Start: 2024-09-04 — End: 2024-09-05

## 2024-09-04 MED ORDER — ALTEPLASE 2 MG IJ SOLR: 2.0000 mg | Freq: Once | INTRAMUSCULAR | Status: DC | PRN

## 2024-09-04 MED ORDER — DEXTROSE 50 % IV SOLN
INTRAVENOUS | Status: AC
  Filled 2024-09-04: qty 50

## 2024-09-04 MED ORDER — HEPARIN SODIUM (PORCINE) 1000 UNIT/ML DIALYSIS: 1000.0000 [IU] | INTRAMUSCULAR | Status: DC | PRN

## 2024-09-04 MED ORDER — LIDOCAINE-PRILOCAINE 2.5-2.5 % EX CREA: 1.0000 | TOPICAL_CREAM | CUTANEOUS | Status: DC | PRN

## 2024-09-04 MED ORDER — OXIDIZED CELLULOSE EX PADS
1.0000 | MEDICATED_PAD | Freq: Once | CUTANEOUS | Status: DC
  Filled 2024-09-04: qty 1

## 2024-09-04 MED ORDER — DEXTROSE 50 % IV SOLN
12.5000 g | INTRAVENOUS | Status: AC
  Administered 2024-09-04: 12.5 g via INTRAVENOUS

## 2024-09-04 NOTE — Progress Notes (Signed)
 Patient transferred to unit. Report received from [place], [place]. Patient stable upon arrival. Skin assessment complete. Head to toe assessment complete.

## 2024-09-04 NOTE — Progress Notes (Signed)
   09/04/24 1328  Vitals  Temp  --   Pulse Rate 94  Resp (!) 30  BP (!) 169/75  SpO2 100 %  O2 Device Nasal Cannula  Weight 32.2 kg  Type of Weight Post-Dialysis  Oxygen Therapy  O2 Flow Rate (L/min) 3 L/min  Patient Activity (if Appropriate) In bed  Pulse Oximetry Type Continuous  Oximetry Probe Site Changed No  Post Treatment  Dialyzer Clearance Other (Comment)  Hemodialysis Intake (mL) 0 mL  Liters Processed 48  Fluid Removed (mL) 1500 mL  Tolerated HD Treatment Yes  Post-Hemodialysis Comments please see the progress notes  AVG/AVF Arterial Site Held (minutes)  (sutured by vascular before any pressure applied)  AVG/AVF Venous Site Held (minutes) 70 minutes   Received tx at bedside---20m13 Alert and oriented.  Informed consent signed and in chart.   TX duration:2.37  Please see progress notes Hand-off given to patient's nurse.   Access used: RUAF Access issues: 15 gauge needles x 2 x 1 attempt-please see progress notes  Total UF removed: 1500 Medication(s) given: none   Delon [facility]

## 2024-09-04 NOTE — Progress Notes (Signed)
 Dr. Norine called and informed of the frequent high TMP alarm throughout the tx yet the circuit had no clots when flushed--pt ran 2.37 minutes and 1500 uf removed Upon removing the venous needle---I had bleeding that would not coagulate at the insertion site plus where some sort of suture was present---pt stated I went to dialysis last week---it was clotted---I went and had a fistulogram and when I came back it was clotted again---I held pressure with several applications or sterifoam for approx 1 hour---intensivist was notified and vascular came and sutured a total of 2 holes shut---additional sterifoam applied and a light pressure dressing in place---instructed to not remove the dressing at this time---no new orders from Dr. Norine at this time

## 2024-09-04 NOTE — Consult Note (Cosign Needed)
 Hospital Consult    Reason for Consult:  bleeding R arm AVF Requesting Physician:  Critical Care MRN #:  [id number]  History of Present Illness: This is a 43 y.o. female admitted to the ICU with acute hypoxic respiratory failure.  She was seen in consultation for evaluation of bleeding right arm AV fistula.  Recently she has had numerous fistulograms with ballooning of recurrent outflow vein stenosis.  At the time of consultation dialysis nurse Delon had been holding pressure for over 30 to 40 minutes.  Right brachiobasilic fistula was created by Dr. Oris in 2022.  Past Medical History:  Diagnosis Date   Anemia    low iron  - receives iron  at dialysis   Anxiety    Arthritis    RA   Atrial fibrillation with RVR (HCC) 12/14/2023   Chronic systolic congestive heart failure (HCC) 03/16/2016   Dyspnea    ESRD (end stage renal disease) (HCC)    [facility] _ [place]   H/O pericarditis 01/17/2013   H/O pleural effusion 01/17/2013   Heart murmur    Lupus (systemic lupus erythematosus) (HCC)    Previously followed with Dr. 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 pneumonae bacteremia on 01/21/2013. Sensitive to Peniccilin     Past Surgical History:  Procedure Laterality Date   A/V FISTULAGRAM N/A 12/15/2023   Procedure: A/V Fistulagram;  Surgeon: Melia Lynwood ORN, MD;  Location: [facility] INVASIVE CV LAB;  Service: Cardiovascular;  Laterality: N/A;   A/V FISTULAGRAM Right 08/10/2024   Procedure: A/V  Fistulagram;  Surgeon: Sheree Penne Bruckner, MD;  Location: [facility] PV LAB;  Service: Cardiovascular;  Laterality: Right;   A/V SHUNT INTERVENTION Right 08/23/2024   Procedure: A/V SHUNT INTERVENTION;  Surgeon: Pearline Norman RAMAN, MD;  Location: [facility] PV LAB;  Service: Cardiovascular;  Laterality: Right;   AV FISTULA PLACEMENT     AV FISTULA PLACEMENT Right 08/01/2020   Procedure: RIGHT ARM BRACHIOCEPHALIC  ARTERIOVENOUS (AV) FISTULA CREATION;  Surgeon: Oris Krystal FALCON, MD;  Location: [facility] OR;  Service: Vascular;  Laterality: Right;   BASCILIC VEIN TRANSPOSITION Left 02/05/2014   Procedure: BASCILIC VEIN TRANSPOSITION;  Surgeon: Krystal FALCON Oris, MD;  Location: [facility] OR;  Service: Vascular;  Laterality: Left;   BASCILIC VEIN TRANSPOSITION Right 01/08/2021   Procedure: RIGHT ARM SECOND STAGE BASCILIC VEIN TRANSPOSITION;  Surgeon: Oris Krystal FALCON, MD;  Location: [facility] OR;  Service: Vascular;  Laterality: Right;   BIOPSY  12/17/2021   Procedure: BIOPSY;  Surgeon: Wilhelmenia Aloha Raddle., MD;  Location: [facility] ENDOSCOPY;  Service: Gastroenterology;;   ESOPHAGOGASTRODUODENOSCOPY (EGD) WITH PROPOFOL  N/A 12/17/2021   Procedure: ESOPHAGOGASTRODUODENOSCOPY (EGD) WITH PROPOFOL ;  Surgeon: Wilhelmenia Aloha Raddle., MD;  Location: [facility] ENDOSCOPY;  Service: Gastroenterology;  Laterality: N/A;   FISTULA SUPERFICIALIZATION Left 05/30/2018   Procedure: FISTULA PLICATION BASILIC VEIN TRANSPOSITION;  Surgeon: Eliza Bruckner RAMAN, MD;  Location: [facility] OR;  Service: Vascular;  Laterality: Left;   FISTULA SUPERFICIALIZATION Left 12/18/2019   Procedure: PLICATION OF LEFT ARTERIOVENOUS FISTULA ULCER;  Surgeon: Oris Krystal FALCON, MD;  Location: [facility] OR;  Service: Vascular;  Laterality: Left;   I & D EXTREMITY Right 02/18/2021   Procedure: IRRIGATION AND DEBRIDEMENT OF ARM;  Surgeon: Eliza Lonni RAMAN, MD;  Location: [facility] OR;  Service: Vascular;  Laterality: Right;   INSERTION OF DIALYSIS CATHETER N/A 05/19/2020   Procedure: TUNNELED INSERTION  OF DIALYSIS  CATHETER;  Surgeon: Sheree Penne Lonni, MD;  Location: [facility] OR;  Service: Vascular;  Laterality: N/A;   PERIPHERAL VASCULAR BALLOON ANGIOPLASTY  12/15/2023   Procedure: PERIPHERAL VASCULAR BALLOON ANGIOPLASTY;  Surgeon: Melia Lynwood ORN, MD;  Location: [facility] INVASIVE CV LAB;  Service: Cardiovascular;;   THROMBECTOMY AND REVISION OF ARTERIOVENTOUS (AV) GORETEX  GRAFT Left 07/28/2020   Procedure: Oversewing of left arm Brachial cephalic fistula for bleeding.;  Surgeon: Eliza Lonni RAMAN, MD;  Location: [facility] OR;  Service: Vascular;  Laterality: Left;   VENOGRAM Right 01/31/2014   Procedure: DIALYSIS CATHETER;  Surgeon: Gaile ORN New, MD;  Location: [facility] CATH LAB;  Service: Cardiovascular;  Laterality: Right;   VENOUS ANGIOPLASTY  08/10/2024   Procedure: VENOUS ANGIOPLASTY;  Surgeon: Sheree Penne Lonni, MD;  Location: [facility] LAB;  Service: Cardiovascular;;  svc and innominante   VENOUS ANGIOPLASTY  08/23/2024   Procedure: VENOUS ANGIOPLASTY;  Surgeon: Pearline Norman RAMAN, MD;  Location: [facility] LAB;  Service: Cardiovascular;;  70% AVF- 95% innominate    Allergies  Allergen Reactions   Cephalosporins Rash and Other (See Comments)    To both keflex  and cefazolin .  Potential bullous lesion from Ceftriaxone  11/2023.   Tobramycin Sulfate Swelling    Eye swelling   Vancomycin  Swelling    Prior to Admission medications   Medication Sig Start Date End Date Taking? Authorizing Provider  amiodarone  (PACERONE ) 200 MG tablet Take 1 tablet (200 mg total) by mouth daily. 04/24/24  Yes Raenelle Coria, MD  cinacalcet  (SENSIPAR ) 30 MG tablet Take 30 mg by mouth daily.   Yes [provider]  doxercalciferol  (HECTOROL ) 4 MCG/2ML injection Inject 0.5 mLs (1 mcg total) into the vein Every Tuesday,Thursday,and Saturday with dialysis. 05/23/24  Yes Rosario Leatrice FERNS, MD  Menthol , Topical Analgesic, (BIOFREEZE) 10 % CREA Apply 1 Application topically daily as needed. 05/31/24  Yes Lorren, Amy J, NP  midodrine   (PROAMATINE ) 10 MG tablet Take 1 tablet (10 mg total) by mouth 3 (three) times daily with meals. 05/12/24  Yes Swayze, Ava, DO  sertraline  (ZOLOFT ) 25 MG tablet Take 1 tablet (25 mg total) by mouth daily. 05/31/24  Yes Lorren, Amy J, NP  sevelamer  carbonate (RENVELA ) 800 MG tablet Take 4 tablets (3,200 mg total) by mouth 3 (three) times daily with meals. 05/31/24  Yes Lorren, Amy J, NP  triamcinolone  (KENALOG ) 0.025 % cream Apply 1 Application topically 2 (two) times daily. Patient not taking: Reported on 09/03/2024 06/01/24   Lorren Greig PARAS, NP    Social History   Socioeconomic History   Marital status: Single    Spouse name: Not on file   Number of children: Not on file   Years of education: Not on file   Highest education level: Not on file  Occupational History   Not on file  Tobacco Use   Smoking status: Every Day    Current packs/day: 0.12    Average packs/day: 0.1 packs/day for 15.0 years (1.8 ttl pk-yrs)    Types: Cigarettes    Passive exposure: Current   Smokeless tobacco: Never   Tobacco comments:    4 cigarettes per  day  Vaping Use   Vaping status: Never Used  Substance and Sexual Activity   Alcohol  use: Yes    Alcohol /week: 0.0 standard drinks of alcohol     Comment: Special Occasional takes Vicar   Drug use: Yes    Types: Marijuana    Comment: ocassional, last time- 12/21/20   Sexual activity: Not Currently    Birth control/protection: None  Other Topics Concern   Not on file  Social History Narrative   Not on file   Social Drivers of Health   Financial Resource Strain: Low Risk  (05/31/2024)   Overall Financial Resource Strain (CARDIA)    Difficulty of Paying Living Expenses: Not hard at all  Food Insecurity: No Food Insecurity (05/20/2024)   Hunger Vital Sign    Worried About Running Out of Food in the Last Year: Never true    Ran Out of Food in the Last Year: Never true  Transportation Needs: No Transportation Needs (05/20/2024)   PRAPARE - Therapist, art (Medical): No    Lack of Transportation (Non-Medical): No  Physical Activity: Inactive (05/31/2024)   Exercise Vital Sign    Days of Exercise per Week: 0 days    Minutes of Exercise per Session: 0 min  Stress: Stress Concern Present (05/31/2024)   [employer] of Occupational Health - Occupational Stress Questionnaire    Feeling of Stress : To some extent  Social Connections: Socially Isolated (05/31/2024)   Social Connection and Isolation Panel    Frequency of Communication with Friends and Family: Never    Frequency of Social Gatherings with Friends and Family: Never    Attends Religious Services: Never    Database administrator or Organizations: No    Attends Banker Meetings: Never    Marital Status: Never married  Intimate Partner Violence: Not At Risk (05/20/2024)   Humiliation, Afraid, Rape, and Kick questionnaire    Fear of Current or Ex-Partner: No    Emotionally Abused: No    Physically Abused: No    Sexually Abused: No    No family history on file.  ROS: Otherwise negative unless mentioned in HPI  Physical Examination  Vitals:   09/04/24 1300 09/04/24 1328  BP: (!) 158/74 (!) 169/75  Pulse:  94  Resp: (!) 41 (!) 30  Temp:  (!) 96.8 F (36 C)  SpO2:  100%   Body mass index is 12.98 kg/m.  General:  WDWN in NAD Gait: Not observed HENT: WNL, normocephalic Pulmonary: normal non-labored breathing Abdomen:  soft, NT/ND, no masses Skin: without rashes Extremities: symmetric grip strength Musculoskeletal: no muscle wasting or atrophy  Neurologic: A&O X 3;  No focal weakness or paresthesias are detected; speech is fluent/normal Psychiatric:  The pt has Normal affect. Lymph:  Unremarkable  CBC    Component Value Date/Time   WBC 7.0 09/03/2024 0236   RBC 4.75 09/03/2024 0236   HGB 13.1 09/03/2024 0236   HCT 39.6 09/03/2024 0236   PLT 159 09/03/2024 0236   MCV 83.4 09/03/2024 0236   MCH 27.6 09/03/2024 0236   MCHC 33.1  09/03/2024 0236   RDW 16.8 (H) 09/03/2024 0236   LYMPHSABS 1.9 08/31/2024 0725   MONOABS 1.1 (H) 08/31/2024 0725   EOSABS 0.1 08/31/2024 0725   BASOSABS 0.0 08/31/2024 0725    BMET    Component Value Date/Time   NA 132 (L) 09/03/2024 0236   K 3.8 09/03/2024 0236   CL 92 (L) 09/03/2024 0236  CO2 24 09/03/2024 0236   GLUCOSE 96 09/03/2024 0236   BUN 24 (H) 09/03/2024 0236   CREATININE 3.62 (H) 09/03/2024 0236   CALCIUM  9.7 09/03/2024 0236   CALCIUM  6.7 (L) 01/31/2014 1309   GFRNONAA 16 (L) 09/03/2024 0236   GFRAA 9 (L) 08/02/2020 1049    COAGS: Lab Results  Component Value Date   INR 1.8 (H) 09/01/2024   INR 1.4 (H) 12/16/2023   INR 1.5 (H) 12/13/2023      ASSESSMENT/PLAN: This is a 43 y.o. female with prolonged bleeding from dialysis access  Ms. Jasmina Gendron is a 43 year old female who has a right brachiobasilic fistula which was created in 2022.  In the last year she has had numerous fistulograms involving ballooning of recurrent outflow vein stenosis.  Vascular was consulted due to bleeding from needle holes for about 30 to 45 minutes despite manual pressure.  A 3-0 nylon suture was used to achieve hemostasis in the venous access site.  Manual pressure was used to achieve hemostasis at the arterial access site.  Given the numerous interventions of the fistula in the last year with limited durability, would not recommend repeat fistulogram.  Recommend placement of dialysis catheter if right arm fistula is no longer usable for dialysis.  On-call vascular surgeon Dr. Lanis was also involved in the management plan of this patient today.   Donnice Sender PA-C [facility] [phone]  VASCULAR STAFF ADDENDUM: I have independently interviewed and examined the patient. I agree with the above.  At this point, if the fistula continues to be a problem, recommend, dialysis catheter placement.  Being that the patient is having a difficult time tolerating  dialysis due to significant hypotension, recommend palliative care discussions.  Fonda FORBES Lanis MD [facility] Phone Number: [phone] 09/05/2024 8:01 AM

## 2024-09-04 NOTE — Progress Notes (Signed)
 Hypoglycemic Event  CBG:   Latest Reference Range & Units 09/04/24 22:28  Glucose-Capillary 70 - 99 mg/dL 67 (L)  (L): Data is abnormally low  Treatment: D50 25 mL (12.5 gm)  Symptoms: None  Follow-up CBG: Time:  CBG Result:   Latest Reference Range & Units 09/04/24 22:54  Glucose-Capillary 70 - 99 mg/dL 871 (H)  (H): Data is abnormally high  Possible Reasons for Event: Inadequate meal intake  Comments/MD notified:    [name]

## 2024-09-04 NOTE — Progress Notes (Signed)
 [facility] Progress Note   Subjective:    Seen and examined patient at bedside in the ICU.  Remains on NE gtt low dose, midodrine  as well.   Objective Vitals:   09/04/24 0100 09/04/24 0400 09/04/24 0700 09/04/24 0715  BP: (!) 125/55 136/78 134/62 (!) 111/90  Pulse:  100    Resp: (!) 21 (!) 28 (!) 40 (!) 30  Temp:      TempSrc:      SpO2:      Weight:      Height:       Physical Exam General: Awake, alert, NAD, cachectic, ill-appearing  Heart: RRR; No MRGs Lungs: normal WOB on 3L [place], O2 sat monitor not picking up Abdomen: Soft and non-tender Extremities: No LE edema Dialysis Access: AVF +t/b on weaker side but present, not pulsatile  Filed Weights   09/01/24 0418 09/02/24 1500 09/02/24 1900  Weight: 39.3 kg 39.3 kg 37.1 kg    Intake/Output Summary (Last 24 hours) at 09/04/2024 0744 Last data filed at 09/04/2024 0400 Gross per 24 hour  Intake 891.11 ml  Output --  Net 891.11 ml    Additional Objective Labs: Basic Metabolic Panel: Recent Labs  Lab 09/01/24 0413 09/02/24 0817 09/03/24 0236  NA 135 132* 132*  K 4.7 4.2 3.8  CL 92* 91* 92*  CO2 23 21* 24  GLUCOSE 81 113* 96  BUN 39* 30* 24*  CREATININE 5.70* 4.34* 3.62*  CALCIUM  9.9 9.4 9.7  PHOS 6.1*  --   --    Liver Function Tests: No results for input(s): AST, ALT, ALKPHOS, BILITOT, PROT, ALBUMIN  in the last 168 hours. No results for input(s): LIPASE, AMYLASE in the last 168 hours. CBC: Recent Labs  Lab 08/31/24 0725 08/31/24 0753 09/01/24 0413 09/02/24 0817 09/03/24 0236  WBC 8.7  --  6.7 7.4 7.0  NEUTROABS 5.6  --   --   --   --   HGB 13.6   < > 12.0 12.3 13.1  HCT 43.1   < > 36.5 38.1 39.6  MCV 88.0  --  84.3 85.2 83.4  PLT 191  --  164 147* 159   < > = values in this interval not displayed.   Blood Culture    Component Value Date/Time   SDES BLOOD SITE NOT SPECIFIED 08/31/2024 1223   SDES BLOOD SITE NOT SPECIFIED 08/31/2024 1223   SPECREQUEST  08/31/2024  1223    BOTTLES DRAWN AEROBIC ONLY Blood Culture results may not be optimal due to an inadequate volume of blood received in culture bottles   SPECREQUEST  08/31/2024 1223    BOTTLES DRAWN AEROBIC ONLY Blood Culture adequate volume   CULT  08/31/2024 1223    NO GROWTH 3 DAYS Performed at [facility], [address], [place], [place] [address]    CULT  08/31/2024 1223    NO GROWTH 3 DAYS Performed at [facility], [address] [place], [place] [address]    REPTSTATUS PENDING 08/31/2024 1223   REPTSTATUS PENDING 08/31/2024 1223    Cardiac Enzymes: No results for input(s): CKTOTAL, CKMB, CKMBINDEX, TROPONINI in the last 168 hours. CBG: Recent Labs  Lab 09/03/24 1107 09/03/24 1518 09/03/24 1939 09/03/24 2349 09/04/24 0414  GLUCAP 103* 84 113* 99 73   Iron  Studies: No results for input(s): IRON , TIBC, TRANSFERRIN, FERRITIN in the last 72 hours. Lab Results  Component Value Date   INR 1.8 (H) 09/01/2024   INR 1.4 (H) 12/16/2023  INR 1.5 (H) 12/13/2023   Studies/Results: DG CHEST PORT 1 VIEW Result Date: 09/03/2024 CLINICAL DATA:  Hypoxemia. EXAM: PORTABLE CHEST 1 VIEW COMPARISON:  August 31, 2024. FINDINGS: Stable cardiomegaly. Stable diffuse interstitial and airspace opacities are noted concerning for pulmonary edema or diffuse atypical infection. Old right rib fracture is noted. Possible small left pleural effusion. IMPRESSION: Stable diffuse interstitial and airspace opacities are noted concerning for pulmonary edema or diffuse atypical infection. Electronically Signed   By: Lynwood Landy Raddle M.D.   On: 09/03/2024 10:52    Medications:  dextrose  30 mL/hr at 09/04/24 0400   norepinephrine  (LEVOPHED ) Adult infusion Stopped (09/03/24 1054)    amiodarone   400 mg Oral BID   Chlorhexidine  Gluconate Cloth  6 each Topical Q0600   feeding supplement (NEPRO CARB STEADY)  237 mL Oral TID BM   heparin   5,000 Units Subcutaneous Q8H   midodrine   10 mg  Oral TID WC   multivitamin  1 tablet Oral QHS   mupirocin  ointment   Nasal BID   mouth rinse  15 mL Mouth Rinse 4 times per day   sertraline   25 mg Oral Daily    Dialysis Orders: [place] [facility] 3h  B400   37.5kg   2K bath  AVF  Heparin  none Post 9/02 wt 38.2kg, usual comes off 0-1.5kg over Hb 12, no esa  Home bp meds: Midodrine  10 tid  Assessment/Plan: SOB/ pulm edema: per CXR. Not grossly overloaded, O2appears to be slowly weaning down, not in distress. Had HD in ICU Friday and Saturday this week, low UF each session d/t low bp's. On Midodrine  TID and IV Levo. Using IV Albumin  for additional BP support.  ESRD: on HD TTS. Plan for dialysis today then resume TTS schedule. Hypotension: Cardiac in origin.  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  Chronic systolic HF/  RV dysfunction/ valve disease: not a candidate for surgical or other invasive therapies due to ongoing cocaine  and high risk  A fib/FL: on amio, no BB due to hypotension, no anticoag due to h/o subdural hematoma.  Other issues as per primary.   Manuelita Barters MD [facility] Pager [phone]

## 2024-09-04 NOTE — Plan of Care (Signed)

## 2024-09-04 NOTE — Consult Note (Signed)
 Palliative Care Consult Note                                  Date: 09/04/2024   Patient Name: [name]  DOB: 01-Dec-1981  MRN: [id number]  Age / Sex: 43 y.o., female  PCP: Lorren Greig PARAS, NP Referring Physician: Kassie Acquanetta Bradley, MD  Reason for Consultation: Establishing goals of care  Past Medical History:  Diagnosis Date   Anemia    low iron  - receives iron  at dialysis   Anxiety    Arthritis    RA   Atrial fibrillation with RVR (HCC) 12/14/2023   Chronic systolic congestive heart failure (HCC) 03/16/2016   Dyspnea    ESRD (end stage renal disease) (HCC)    [facility] _ [place]   H/O pericarditis 01/17/2013   H/O pleural effusion 01/17/2013   Heart murmur    Lupus (systemic lupus erythematosus) (HCC)    Previously followed with Dr. 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 pneumonae bacteremia on 01/21/2013. Sensitive to Peniccilin     Subjective:   This NP [name] reviewed medical records, received report from team, assessed the patient and then meet at the patient's bedside to discuss diagnosis, prognosis, GOC, EOL wishes disposition and options.  Before meeting with the patient/family, I spent time reviewing the chart notes including cardiology note from yesterday, nephrology note from yesterday, PCM note from yesterday, PT note from yesterday.  I also reviewed nursing note from yesterday, nephrology and cardiology notes from  today, PCCM note from today, nursing note from today. I also reviewed vital signs, nursing flowsheets, medication administrations record, labs, and imaging. Labs reviewed include lactic acid which shows persistent elevation at 3.2 today with a peak of 4.5 four days ago noted by PCCM is likely due to impaired clearance with HD and cardiac state.  I reviewed CBC from yesterday which is reassuring for continued normal white blood cell count in the setting of acute respiratory failure with previously thought to be pneumonia, essentially ruled out.  BMP yesterday shows stable potassium and persistently elevated creatinine 3.62 in the setting of ESRD on HD.  She is scheduled to get HD today.  I met with the patient at bedside, sister Shawnee is present.  Sometime after seeing the patient I called the patient's mother Shawnee and introduced palliative medicine and offer ongoing support during admission.   We meet to discuss diagnosis prognosis, GOC, EOL wishes, disposition and options. Concept of Palliative Care was introduced as specialized medical care for people and their families living with serious illness.  If focuses on providing relief from the symptoms and stress of a serious illness.  The goal is to improve quality of life for both the patient and the family. Values and goals of care important to patient and family were attempted to be elicited.  Created space and opportunity for patient  and family to  explore thoughts and feelings regarding current medical situation   Natural trajectory and current clinical status were discussed. Questions and concerns addressed. Patient  encouraged to call with questions or concerns.    Patient/Family Understanding of Illness: Overall the patient seems to have a good understanding of her current chronic and acute illnesses.  Initially when I was asking her questions about orientation and why she is in the hospital she states because I am in respiratory failure.  She  further tells me she has had kidney failure and been on dialysis for 15 years, primarily goes to [facility].  We talked about dialysis and complications and symptoms that we had and she states generally she had a lot of cramping early on in her dialysis journey but currently she tolerates pretty well.  She seems agreeable to continue dialysis.  We also talked about details regarding her acute on chronic heart failure, A-fib/flutter, severe valve disease.  We discussed that none of these have a fix that she is a candidate for and she seems to understand this.  Interestingly, one of her statements she said when I asked if she understands what is going on with her health is that I am not going to hospice.  Life Review: The patient currently lives with her mom, has not previously worked.  She has 2 daughters and 3 grandchildren.  Mother lives in [place] and 1 daughter lives closer to [place].  She enjoys reading, specifically Lynwood Gander, and [employer].  She is not a religious or spiritual person.  Patient Values: Continued medical improvement, family  Baseline Status: At baseline she needs some assistance with most of her IADLs.  She again, lives with her mother.  She has PT and OT that she sees at the outpatient center.  She states her mom drives her to appointments.  Today's Discussion: In addition to discussions described above we had discussion on various topics.  We discussed decision making.  While the patient has capacity now and is very sharp and seems to have a good understanding of her health, we discussed that because she is very sick there may come a time where she is unable to have meaningful conversation or make medical decisions.  We asked about who she would want to be her medical decision-maker if she could not and she named her mother.  I recommended completion of HCPOA documentations with the chaplain and she is in agreement.    We discussed options for treatment  moving forward.  She is clear that she is not interested in hospice care.  We discussed CODE STATUS and she is clear that she wants to remain a full code and is open to CPR and intubation if it is possible.  We shared that decision on whether to proceed with cardiopulmonary resuscitation is a patient decision.  I told the patient I would call her mother and let her know that I saw her and discussed HCPOA with her as well.  I shared that I would come back tomorrow to check on her and she is in agreement.  After seeing the patient I called her mother.  We discussed her current clinical status and she seems to be understanding.  She states that they have been here before as well.  I discussed patient's wishes for full code and full scope of care, open to all available and offered interventions.  Patient's mother is in agreement.  I also told her that the patient is wishing to name her  HCPOA and we will work on completing Pap paperwork to make this legal and official, she understands.  I provided the palliative medicine contact number for any questions or concerns moving forward and told her that we would continue to follow-up.  I provided emotional and general support through therapeutic listening, empathy, sharing of stories, therapeutic touch, and other techniques. I answered all questions and addressed all concerns to the best of my ability.  Goals: Full code, full scope of care.  The patient seems open to all offered and available interventions.  Review of Systems  Constitutional:        Denies pain in general    Objective:   Primary Diagnoses: Present on Admission:  Acute respiratory failure with hypoxia (HCC)  Acute on chronic combined systolic and diastolic CHF (congestive heart failure) (HCC)   Vital Signs:  BP (!) 160/59   Pulse 91   Temp (!) 97.4 F (36.3 C)   Resp (!) 38   Ht 5' 2 (1.575 m)   Wt 33.7 kg   LMP  (LMP Unknown) Comment: states over 2 years ago  SpO2 100%    BMI 13.59 kg/m   Physical Exam Vitals and nursing note reviewed.  Constitutional:      General: She is not in acute distress.    Appearance: She is ill-appearing.  HENT:     Head: Normocephalic and atraumatic.  Cardiovascular:     Rate and Rhythm: Normal rate. Rhythm irregular.  Pulmonary:     Effort: Pulmonary effort is normal. No respiratory distress.  Abdominal:     General: Abdomen is flat.  Skin:    General: Skin is warm and dry.  Neurological:     General: No focal deficit present.     Mental Status: She is alert and oriented to person, place, and time. Mental status is at baseline.  Psychiatric:        Mood and Affect: Mood is depressed.        Behavior: Behavior normal.     Palliative Assessment/Data: 60-70%   Advanced Care Planning:   Existing Vynca/ACP Documentation: Patient none  Primary Decision Maker: PATIENT  Pertinent diagnosis: Severe acute respiratory failure with pulmonary edema, pneumonia.  Acute on chronic heart failure with severe TR, severe MR, severe AR, mild pulmonary hypertension, A-fib/flutter, ESRD, lupus nephritis  The patient and/or family consented to a voluntary Advance Care Planning Conversation in person. Individuals present for the conversation: Patient, sister Shawnee [name], NP, Kathlyne Bolder, NP  Summary of the conversation: We discussed clinical picture both acute and chronic illnesses, CODE STATUS, options moving forward, severity of illness, and patient wishes.  Also discussed HCPOA documentation.  Outcome of the conversations and/or documents completed: Full code, full scope of care, open to all available and offered medical interventions for improvement, understands her severity of illness, agreeable to complete HCPOA documentation with chaplain.  I spent 20 minutes providing separately identifiable ACP services with the patient and/or surrogate decision maker in a voluntary, in-person conversation discussing the patient's  wishes and goals as detailed in the above note.  Assessment & Plan:   HPI/Patient Profile: 43 y.o. 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  She was admitted on 08/31/2024 with acute hypoxic respiratory failure, pulmonary edema, pneumonia,  acute on chronic heart failure, mitral regurg, tricuspid regurg, aortic regurg, atrial fibrillation, ESRD, lupus nephritis, AGMA, hyperkalemia, severe protein calorie malnutrition, unintentional weight loss, physical deconditioning/generalized weakness, and others.   Palliative medicine was consulted for GOC conversations.  SUMMARY OF RECOMMENDATIONS   Full code Full scope of care Patient open to all offered and available interventions for medical improvement Spiritual care consult for HCPOA completion Continued emotional support of patient and family Palliative medicine will continue to follow  Symptom Management:  Per primary team Palliative medicine is available to assist as needed  Code Status: Full Code  Prognosis:  Unable to determine  Discharge Planning:  To Be Determined   Discussed with: Patient, family, medical team, nursing team    Thank you for allowing us  to participate in the care of MARIEANNE MARXEN PMT will continue to support holistically.  Billing based on MDM: High  Problems Addressed: One acute or chronic illness or injury that poses a threat to life or bodily function  Amount and/or Complexity of Data: Category 1:Review of prior external note(s) from each unique source, Review of the result(s) of each unique test, and Assessment requiring an independent historian(s) and Category 3:Discussion of management or test interpretation with external physician/other qualified health care  professional/appropriate source (not separately reported)  Risks: N/A  Detailed review of medical records (labs, imaging, vital signs), medically appropriate exam, discussed with treatment team, counseling and education to patient, family, & staff, documenting clinical information, medication management, coordination of care  Signed by: [name], NP Palliative Medicine Team  Team Phone # [phone] (Nights/Weekends)  09/04/2024, 11:10 AM

## 2024-09-04 NOTE — Progress Notes (Signed)
 NAME:  [name], MRN:  [id number], DOB:  Aug 23, 1981, LOS: 4 ADMISSION DATE:  08/31/2024, CONSULTATION DATE:  08/31/24 REFERRING MD: Dr. 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 Also had decreased appetite and fatigue. Denies fevers, chills. In ED, started on BiPAP. Able to transition to 6L [place]. CXR with pulmonary edema. K 5.3 BNP elevated. Normal WBC. LA 6.2>3.2. Given levaquin . SBP readings low-normal with intermittent readings with SBP in 60s and 70s but mentating well and eating a chicken wrap. PCCM and Nephrology consulted. Plan for hemodialysis in ICU.   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 HD 1.1L 9/5: HD removed 1 with levophed  9/6: HD removed 2L with levophed  9/7: Weaning pressors.  Interim History / Subjective:  Weaned off levophed  yesterday Planned for HD again. Will monitor pressures while in ICU. May be able to transfer if tolerated Poor PO intake Objective   Blood pressure (!) 118/52, pulse 90, temperature (!) 97.4 F (36.3 C), resp. rate (!) 28, height 5' 2 (1.575 m), weight 33.7 kg, SpO2 100%.        Intake/Output Summary (Last 24 hours) at 09/04/2024 0818 Last data filed at 09/04/2024 0800 Gross per 24 hour  Intake 970.16 ml  Output --  Net 970.16 ml   Filed Weights   09/02/24  1500 09/02/24 1900 09/04/24 0600  Weight: 39.3 kg 37.1 kg 33.7 kg  Physical Exam: General: Cachectic, chronically ill-appearing, no acute distress HENT: [place], AT, OP clear, MMM Eyes: EOMI, no scleral icterus Respiratory: Clear to auscultation bilaterally.  No crackles, wheezing or rales Cardiovascular: RRR, -M/R/G, no JVD GI: BS+, soft, nontender Extremities:-Edema,-tenderness Neuro: AAO x4, CNII-XII grossly intact Skin: Intact, no rashes or bruising Psych: Normal mood, normal affect    Imaging, labs and test in EMR in the last 24 hours reviewed independently by me. Pertinent findings below: LA 3.2 - improved  Resolved Hospital Problem list   None.  Assessment & Plan:  Acute hypoxic respiratory failure (vs AoC hypoxic resp failure) - clinically improving. Difficulty obtaining saturations Pulmonary edema Pneumonia -ruled out. Absence of fever/WBC/respiratory sx AHRF secondary to volume overload in the setting of CHF and ESRD. Blood cultures remain negative at 48 hours. Patient is hemodynamically stable on 5 L supplemental oxygen. Elevated lactate likely due to low-output cardiac state and impaired clearance from ESRD. Will recheck lactate following completion of hemodialysis session. Plan:  - Wean supplemental O2 for goal >88%. Obtaining good waveforms on SpO2 monitor remains a barrier to weaning but patient is clinically improving from respiratory standpoint - Trend LA - CXR 9/7 with unchanged interstitial airspace opacities suggestive of pulmonary edema   Acute on Chronic heart failure Mitral regurg Tricuspid regurg Aortic regurg  Echocardiogram shows LV systolic function LVEF 30-35% (previously 35% to 40%), severely reduced RV function, mild pulmonary hypertension, severe tricuspid regurgitation, and moderate-severe aortic regurgitation. She is not a candidate  for surgical or interventional cardiovascular therapies due to ongoing cocaine  use and overall risk profile.  -  Appreciate cardiology input - Volume removal via dialysis. Trial off pressor support today if able  Atrial fibrillation Rhythm control with amiodarone  chronically.    Not on rate control with metoprolol  as a blood pressure no longer tolerates it. Not on anticoagulation due to hx of subdural hematoma.    Plan: - Tele - Continue amiodarone  400 mg twice daily   ESRD  Lupus nephritis  AGMA Hyperkalemia Hypocalcemia  S/p HD 9/4-9/6 - Nephrology following. Dialysis today  IDDM with hypoglycemia  Intermittent hypoglycemia, stable this morning. Plan: - Continue D10 continuous infusion - Blood sugars Q4hrs. - SSI.   Severe protein calorie malnutrition Unintentional wt loss Physical deconditioning, generalized wknss  P -RDN consult this admission -PT/OT when appropriate   Best Practice (right click and Reselect all SmartList Selections daily)   Diet/type: Regular diet. DVT prophylaxis: prophylactic heparin   GI prophylaxis: N/A Lines: N/A Foley:  N/A Code Status:  full code  Critical care time: 31 min      The patient is hypotension and respiratory failure and requires high complexity decision making for assessment and support, frequent evaluation and titration of therapies, application of advanced monitoring technologies and extensive interpretation of multiple databases.  Independent Critical Care Time: 31 Minutes.   Slater Staff, M.D. [facility] Pulmonary/Critical Care Medicine 09/04/2024 8:18 AM   Please see Amion for pager number to reach on-call Pulmonary and Critical Care Team.

## 2024-09-04 NOTE — Plan of Care (Signed)
   Problem: Education: Goal: Knowledge of General Education information will improve Description: Including pain rating scale, medication(s)/side effects and non-pharmacologic comfort measures Outcome: Completed/Met

## 2024-09-04 NOTE — Progress Notes (Signed)
 LA swollen per night shift RN. LA cold but so were all pt's extremities. IV flushes but no blood return noted. IV team consulted, per IV RN, IV infiltrated, no other Piv access and pt difficult to stick for labs too. Dr Celestina and Harlene, Pharmacist, notified. Supportive care done, alternate warm/cold compress per pharmacist.

## 2024-09-04 NOTE — Progress Notes (Signed)
 Rounding Note    Patient Name: [name] Date of Encounter: 09/04/2024  [facility] Cardiologist: Shelda Bruckner, MD   Subjective   Remains on midodrine  due to low blood pressures, levophed  stopped yesterday. Has some back pain. Feels like breathing is better. On HD.  Inpatient Medications    Scheduled Meds:  amiodarone   400 mg Oral BID   Chlorhexidine  Gluconate Cloth  6 each Topical Q0600   feeding supplement (NEPRO CARB STEADY)  237 mL Oral TID BM   heparin   5,000 Units Subcutaneous Q8H   midodrine   10 mg Oral TID WC   multivitamin  1 tablet Oral QHS   mupirocin  ointment   Nasal BID   mouth rinse  15 mL Mouth Rinse 4 times per day   sertraline   25 mg Oral Daily   Continuous Infusions:  dextrose  30 mL/hr at 09/04/24 0800   norepinephrine  (LEVOPHED ) Adult infusion Stopped (09/03/24 1054)   PRN Meds: acetaminophen , alteplase , docusate sodium , heparin , lidocaine  (PF), lidocaine -prilocaine , mouth rinse, pentafluoroprop-tetrafluoroeth, polyethylene glycol   Vital Signs    Vitals:   09/04/24 0715 09/04/24 0740 09/04/24 0747 09/04/24 0800  BP: (!) 111/90  (!) 120/108 (!) 118/52  Pulse:   90 90  Resp: (!) 30  (!) 23 (!) 28  Temp:  (!) 97.4 F (36.3 C) (!) 97.4 F (36.3 C)   TempSrc:  Oral    SpO2:    100%  Weight:      Height:        Intake/Output Summary (Last 24 hours) at 09/04/2024 0811 Last data filed at 09/04/2024 0800 Gross per 24 hour  Intake 970.16 ml  Output --  Net 970.16 ml      09/04/2024    6:00 AM 09/02/2024    7:00 PM 09/02/2024    3:00 PM  Last 3 Weights  Weight (lbs) 74 lb 4.7 oz 81 lb 12.7 oz 86 lb 10.3 oz  Weight (kg) 33.7 kg 37.1 kg 39.3 kg      Telemetry    Atrial flutter, rate controlled, with intermittent PVCs and PVC couplet/triplets - Personally Reviewed  Physical Exam   GEN: frail appearing, very thin NECK: difficult to visualize JVD due to position in bed during HD CARDIAC: largely regular rhythm, normal S1  and S2, no rubs or gallops. 2-3/6 systolic murmur both R and L sternal border, did not appreciate diastolic murmur over sound of HD machine. VASCULAR: Radial pulses 2+ bilaterally.  RESPIRATORY:  Slightly rhonchorous bilaterally ABDOMEN: Soft, non-tender, non-distended MUSCULOSKELETAL:  Moves all 4 limbs independently SKIN: Warm and dry, no edema NEUROLOGIC:  No focal neuro deficits noted. PSYCHIATRIC:  Normal affect    New pertinent results (labs, ECG, imaging, cardiac studies)    Echo personally reviewed, as below  Assessment & Plan    Atrial flutter -chadsvasc=3, but not a candidate for anticoagulation due to falls/subdural hematoma -cannot cardiovert as we cannot anticoagulate -given low blood pressure, only option is amiodarone .  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 It appears that due to eccentricity of the AR and MR  jets, the regurgitation is likely underestimated--visually, there is a wide vena contracta and PHT inaccurate due to elevated LVEDP, more suggestive of severe AR. For MR, pulmonary vein not captured, but appears to have some Coanda effect, likely severe -her valves are thickened, not yet significantly stenotic, but annulus also appears calcified. Would be high risk surgery, and given her overall clinical status/comorbidities, she is not a candidate for multivalve repair -volume management per HD  Lactic acidosis: persistently elevated despite HD and use  of pressors this admission  Overall difficult situation. She is young but has multiple severe clinical conditions. Her valve disease is severe, her cardiomyopathy cannot be treated with GDMT, and she is not a candidate for advanced therapies for heart failure given her history/comorbidities. Given recurrent admissions, symptoms, and lack of definitive treatment options, palliative care evaluation is recommended.   Cardiology will sign off at this time. She has not kept any outpatient cardiology visits historically, so will not schedule at this time, but please contact us  with any questions or concerns.    Signed, Shelda Bruckner, MD  09/04/2024, 8:11 AM

## 2024-09-05 ENCOUNTER — Encounter (HOSPITAL_COMMUNITY): Payer: Self-pay | Admitting: Pulmonary Disease

## 2024-09-05 ENCOUNTER — Inpatient Hospital Stay (HOSPITAL_COMMUNITY)

## 2024-09-05 DIAGNOSIS — J9601 Acute respiratory failure with hypoxia: Secondary | ICD-10-CM

## 2024-09-05 DIAGNOSIS — I5043 Acute on chronic combined systolic (congestive) and diastolic (congestive) heart failure: Secondary | ICD-10-CM

## 2024-09-05 DIAGNOSIS — E162 Hypoglycemia, unspecified: Secondary | ICD-10-CM

## 2024-09-05 DIAGNOSIS — E872 Acidosis, unspecified: Secondary | ICD-10-CM | POA: Diagnosis not present

## 2024-09-05 DIAGNOSIS — R570 Cardiogenic shock: Secondary | ICD-10-CM

## 2024-09-05 DIAGNOSIS — Z7189 Other specified counseling: Secondary | ICD-10-CM | POA: Diagnosis not present

## 2024-09-05 DIAGNOSIS — M3214 Glomerular disease in systemic lupus erythematosus: Secondary | ICD-10-CM

## 2024-09-05 DIAGNOSIS — Z515 Encounter for palliative care: Secondary | ICD-10-CM | POA: Diagnosis not present

## 2024-09-05 LAB — RENAL FUNCTION PANEL
Albumin: 2.3 g/dL — ABNORMAL LOW (ref 3.5–5.0)
Anion gap: 15 (ref 5–15)
BUN: 47 mg/dL — ABNORMAL HIGH (ref 6–20)
CO2: 24 mmol/L (ref 22–32)
Calcium: 9.3 mg/dL (ref 8.9–10.3)
Chloride: 88 mmol/L — ABNORMAL LOW (ref 98–111)
Creatinine, Ser: 5.04 mg/dL — ABNORMAL HIGH (ref 0.44–1.00)
GFR, Estimated: 10 mL/min — ABNORMAL LOW (ref >60)
Glucose, Bld: 126 mg/dL — ABNORMAL HIGH (ref 70–99)
Phosphorus: 5.5 mg/dL — ABNORMAL HIGH (ref 2.5–4.6)
Potassium: 4.1 mmol/L (ref 3.5–5.1)
Sodium: 127 mmol/L — ABNORMAL LOW (ref 135–145)

## 2024-09-05 LAB — CBC
HCT: 35.2 % — ABNORMAL LOW (ref 36.0–46.0)
Hemoglobin: 11.7 g/dL — ABNORMAL LOW (ref 12.0–15.0)
MCH: 27.9 pg (ref 26.0–34.0)
MCHC: 33.2 g/dL (ref 30.0–36.0)
MCV: 83.8 fL (ref 80.0–100.0)
Platelets: 159 K/uL (ref 150–400)
RBC: 4.2 MIL/uL (ref 3.87–5.11)
RDW: 17.6 % — ABNORMAL HIGH (ref 11.5–15.5)
WBC: 8.8 K/uL (ref 4.0–10.5)
nRBC: 0.8 % — ABNORMAL HIGH (ref 0.0–0.2)

## 2024-09-05 LAB — GLUCOSE, CAPILLARY
Glucose-Capillary: 107 mg/dL — ABNORMAL HIGH (ref 70–99)
Glucose-Capillary: 183 mg/dL — ABNORMAL HIGH (ref 70–99)
Glucose-Capillary: 201 mg/dL — ABNORMAL HIGH (ref 70–99)
Glucose-Capillary: 68 mg/dL — ABNORMAL LOW (ref 70–99)
Glucose-Capillary: 90 mg/dL (ref 70–99)
Glucose-Capillary: 95 mg/dL (ref 70–99)

## 2024-09-05 LAB — CULTURE, BLOOD (ROUTINE X 2)
Culture: NO GROWTH
Culture: NO GROWTH
Special Requests: ADEQUATE

## 2024-09-05 LAB — MAGNESIUM: Magnesium: 2.3 mg/dL (ref 1.7–2.4)

## 2024-09-05 MED ORDER — PENTAFLUOROPROP-TETRAFLUOROETH EX AERO: 1.0000 | INHALATION_SPRAY | CUTANEOUS | Status: DC | PRN

## 2024-09-05 MED ORDER — ANTICOAGULANT SODIUM CITRATE 4% (200MG/5ML) IV SOLN
5.0000 mL | Status: DC | PRN
Start: 2024-09-05 — End: 2024-09-05

## 2024-09-05 MED ORDER — LIDOCAINE-PRILOCAINE 2.5-2.5 % EX CREA: 1.0000 | TOPICAL_CREAM | CUTANEOUS | Status: DC | PRN

## 2024-09-05 MED ORDER — HEPARIN SODIUM (PORCINE) 1000 UNIT/ML DIALYSIS: 1000.0000 [IU] | INTRAMUSCULAR | Status: DC | PRN

## 2024-09-05 MED ORDER — MELATONIN 3 MG PO TABS
3.0000 mg | ORAL_TABLET | Freq: Every evening | ORAL | Status: DC | PRN
  Administered 2024-09-05 – 2024-09-10 (×4): 3 mg via ORAL
  Filled 2024-09-05 (×4): qty 1

## 2024-09-05 MED ORDER — FENTANYL CITRATE PF 50 MCG/ML IJ SOSY
25.0000 ug | PREFILLED_SYRINGE | INTRAMUSCULAR | Status: DC | PRN
Start: 2024-09-05 — End: 2024-09-09
  Administered 2024-09-07 – 2024-09-09 (×12): 25 ug via INTRAVENOUS
  Filled 2024-09-05 (×14): qty 1

## 2024-09-05 MED ORDER — NEPRO/CARBSTEADY PO LIQD: 237.0000 mL | ORAL | Status: DC | PRN

## 2024-09-05 MED ORDER — NALOXONE HCL 0.4 MG/ML IJ SOLN: 0.4000 mg | INTRAMUSCULAR | Status: DC | PRN

## 2024-09-05 MED ORDER — ANTICOAGULANT SODIUM CITRATE 4% (200MG/5ML) IV SOLN: 5.0000 mL | Status: DC | PRN

## 2024-09-05 MED ORDER — LIDOCAINE HCL (PF) 1 % IJ SOLN: 5.0000 mL | INTRAMUSCULAR | Status: DC | PRN

## 2024-09-05 MED ORDER — CHLORHEXIDINE GLUCONATE CLOTH 2 % EX PADS
6.0000 | MEDICATED_PAD | Freq: Every day | CUTANEOUS | Status: DC
  Administered 2024-09-05: 6 via TOPICAL

## 2024-09-05 MED ORDER — DEXTROSE 50 % IV SOLN
INTRAVENOUS | Status: AC
  Administered 2024-09-05: 25 mL
  Filled 2024-09-05: qty 50

## 2024-09-05 MED ORDER — ALTEPLASE 2 MG IJ SOLR: 2.0000 mg | Freq: Once | INTRAMUSCULAR | Status: DC | PRN

## 2024-09-05 MED ORDER — INSULIN ASPART 100 UNIT/ML IJ SOLN: 0.0000 [IU] | Freq: Three times a day (TID) | INTRAMUSCULAR | Status: DC

## 2024-09-05 NOTE — Progress Notes (Signed)
 [facility] night cross cover note:   I was notified by the patient's RN that the patient is complaining of some left arm discomfort, refractory to existing order for prn acetaminophen , and is requesting additional pain medication to address this.  Not associated with any additional acute symptoms, including no report of any associated chest pain or shortness of breath.  I subsequently added prn IV fentanyl  to help further address her left upper extremity discomfort.     Eva Pore, DO Hospitalist

## 2024-09-05 NOTE — Progress Notes (Signed)
 Daily Progress Note   Date: 09/05/2024   Patient Name: [name]  DOB: 10-07-1981  MRN: [id number]  Age / Sex: 43 y.o., female  Attending Physician: [name] Mignon DASEN, MD Primary Care Physician: [name] Greig PARAS, NP Admit Date: 08/31/2024 Length of Stay: 5 days  Reason for Follow-up: {Reason for Consult:23484}  Past Medical History:  Diagnosis Date   Anemia    low iron  - receives iron  at dialysis   Anxiety    Arthritis    RA   Atrial fibrillation with RVR (HCC) 12/14/2023   Chronic systolic congestive heart failure (HCC) 03/16/2016   Dyspnea    ESRD (end stage renal disease) (HCC)    [facility] _ [place]   H/O pericarditis 01/17/2013   H/O pleural effusion 01/17/2013   Heart murmur    Lupus (systemic lupus erythematosus) (HCC)    Previously followed with Dr. 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 pneumonae bacteremia on 01/21/2013. Sensitive to Peniccilin     Subjective:   Subjective: Chart Reviewed. Updates received. Patient Assessed. Created space and opportunity for patient  and family to explore thoughts and feelings regarding current medical situation.  Today's Discussion: Today before meeting with the patient/family, I reviewed the chart notes including ***. I also reviewed vital signs, nursing flowsheets, medication administrations record, labs, and imaging. Labs reviewed include ***.  ***  Review of Systems  Objective:   Primary Diagnoses: Present on Admission:  Acute  respiratory failure with hypoxia (HCC)  Acute on chronic combined systolic and diastolic CHF (congestive heart failure) (HCC)  Atrial flutter (HCC)  Fluid overload  Hemodialysis catheter malfunction (HCC)  Insomnia  Lupus (systemic lupus erythematosus) (HCC)   Vital Signs:  BP (!) 121/47 (BP Location: Right Leg)   Pulse 84   Temp 98.1 F (36.7 C) (Oral)   Resp 18   Ht 5' 2 (1.575 m)   Wt 36.3 kg   LMP  (LMP Unknown) Comment: states over 2 years ago  SpO2 100%   BMI 14.65 kg/m   Physical Exam  Palliative Assessment/Data: ***   Existing Vynca/ACP Documentation: ***  Advanced Care Planning:   Existing Vynca/ACP Documentation: ***  Primary Decision Maker: {Primary Decision Fjxzm:78612}  Pertinent diagnosis: ***  The patient and/or family consented to a voluntary Advance Care Planning Conversation in person/over the phone***. Individuals present for the conversation: ***  Summary of the conversation: ***  Outcome of the conversations and/or documents completed: ***  I spent *** minutes providing separately identifiable ACP services with the patient and/or surrogate decision maker in a voluntary, in-person conversation discussing the patient's wishes and goals as detailed in the above note.  Assessment & Plan:   HPI/Patient Profile:  ***  SUMMARY OF RECOMMENDATIONS   ***  Symptom Management:  ***  Code Status: {Updated Palliative Code Status:33307}  Prognosis: {Palliative Care Prognosis:23504}  Discharge Planning: {Palliative dispostion:23505}  Discussed with: ***  Thank you for allowing us  to participate in the care of [name]  [name] PMT will continue to support holistically.  Time Total: ***  Detailed review of medical records (labs, imaging, vital signs), medically appropriate exam, discussed with treatment team, counseling and education to patient, family, & staff, documenting clinical information, medication management, coordination of  care  [name] Kays, NP Palliative Medicine Team  Team Phone # [phone] (Nights/Weekends)  08/26/2021, 8:17 AM

## 2024-09-05 NOTE — Plan of Care (Signed)
   Problem: Health Behavior/Discharge Planning: Goal: Ability to manage health-related needs will improve Outcome: Completed/Met

## 2024-09-05 NOTE — Progress Notes (Signed)
 BP retaken 121/47

## 2024-09-05 NOTE — Plan of Care (Signed)
  Problem: Health Behavior/Discharge Planning: Goal: Ability to manage health-related needs will improve Outcome: Progressing   Problem: Clinical Measurements: Goal: Ability to maintain clinical measurements within normal limits will improve Outcome: Progressing Goal: Will remain free from infection Outcome: Progressing Goal: Diagnostic test results will improve Outcome: Progressing Goal: Respiratory complications will improve Outcome: Progressing Goal: Cardiovascular complication will be avoided Outcome: Progressing   Problem: Activity: Goal: Risk for activity intolerance will decrease Outcome: Progressing   Problem: Nutrition: Goal: Adequate nutrition will be maintained Outcome: Progressing   Problem: Coping: Goal: Level of anxiety will decrease Outcome: Progressing   Problem: Elimination: Goal: Will not experience complications related to bowel motility Outcome: Progressing Goal: Will not experience complications related to urinary retention Outcome: Progressing   Problem: Pain Managment: Goal: General experience of comfort will improve and/or be controlled Outcome: Progressing   Problem: Safety: Goal: Ability to remain free from injury will improve Outcome: Progressing   Problem: Skin Integrity: Goal: Risk for impaired skin integrity will decrease Outcome: Progressing   Problem: Education: Goal: Individualized Educational Video(s) Outcome: Progressing   Problem: Fluid Volume: Goal: Compliance with measures to maintain balanced fluid volume will improve Outcome: Progressing   Problem: Health Behavior/Discharge Planning: Goal: Ability to manage health-related needs will improve Outcome: Progressing   Problem: Nutritional: Goal: Ability to make healthy dietary choices will improve Outcome: Progressing   Problem: Clinical Measurements: Goal: Complications related to the disease process, condition or treatment will be avoided or minimized Outcome:  Progressing   Problem: Education: Goal: Ability to describe self-care measures that may prevent or decrease complications (Diabetes Survival Skills Education) will improve Outcome: Progressing Goal: Individualized Educational Video(s) Outcome: Progressing   Problem: Coping: Goal: Ability to adjust to condition or change in health will improve Outcome: Progressing   Problem: Fluid Volume: Goal: Ability to maintain a balanced intake and output will improve Outcome: Progressing   Problem: Health Behavior/Discharge Planning: Goal: Ability to identify and utilize available resources and services will improve Outcome: Progressing Goal: Ability to manage health-related needs will improve Outcome: Progressing   Problem: Metabolic: Goal: Ability to maintain appropriate glucose levels will improve Outcome: Progressing   Problem: Nutritional: Goal: Maintenance of adequate nutrition will improve Outcome: Progressing Goal: Progress toward achieving an optimal weight will improve Outcome: Progressing   Problem: Skin Integrity: Goal: Risk for impaired skin integrity will decrease Outcome: Progressing   Problem: Tissue Perfusion: Goal: Adequacy of tissue perfusion will improve Outcome: Progressing

## 2024-09-05 NOTE — Progress Notes (Signed)
 eLink Physician-Brief Progress Note Patient Name: FIORELLA HANAHAN DOB: 1981/01/11 MRN: [id number]   Date of Service  09/05/2024  HPI/Events of Note  Pt with insomnia  eICU Interventions  Placed order for PRN melatonin. Will follow response.         Brittanee Ghazarian M DELA CRUZ 09/05/2024, 12:43 AM

## 2024-09-05 NOTE — Progress Notes (Signed)
 This chaplain responded to PMT NP-Eric consult for creating the Pt. Advance Directive:  HCPOA. The chaplain understands from PMT the Pt. plans to name her mother as healthcare agent.    The Pt. is in HD at the time visit. The chaplain introduced herself and began building rapport with the Pt. The Pt. agreed to a F/U on Wed.  Chaplain Leeroy Hummer [phone]

## 2024-09-05 NOTE — Progress Notes (Signed)
 BP taken 57/38 pt asymptomatic. Will retake to confirm if this is accurate.

## 2024-09-05 NOTE — Progress Notes (Signed)
 PROGRESS NOTE  [name] FMW:[id number] DOB: June 17, 1981   PCP: Lorren Greig PARAS, NP  Patient is from: Home.  Lives with mother.  Independently ambulates at baseline.  DOA: 08/31/2024 LOS: 5  Chief complaints Chief Complaint  Patient presents with   Respiratory Distress     Brief Narrative / Interim history: 43 year old female with ESRD 2/2 lupus nephritis, chronic hypotension, atrial fib/flutter not on AC due to hx SDH, chronic systolic heart failure, RV failure, AR, TR, MR and protein malnutrition who p/w progressive SOB x 1 week despite compliance with HD sessions. Also had decreased appetite and fatigue. Denies fevers, chills. In ED, started on BiPAP. Able to transition to 6L [place]. CXR with pulmonary edema. K 5.3 BNP elevated. Normal WBC. LA 6.2>3.2. Given levaquin . SBP readings low-normal with intermittent readings with SBP in 60s and 70s but mentating well and eating a chicken wrap. PCCM and Nephrology consulted. Plan for hemodialysis in ICU.   While in ICU, patient had daily HD with ultrafiltration with vasopressor support from 9/4-9/6.  She was weaned off vasopressor on 9/7 and tolerated HD with ultrafiltration of 1.5 L on 9/8.  Transferred to hospitalist service on 9/9.  Bleeding from right AVF after venous needle was pulled on 9/8 requiring stitch, manual pressure and dressing.  VVS recommended HD cath    Subjective: Seen and examined earlier this morning.  No major events overnight or this morning.  Reports improvement in her breathing.  Feels weak and fatigued.  Denies chest pain, nausea, vomiting or abdominal pain.  Objective: Vitals:   09/04/24 2015 09/05/24 0438 09/05/24 0700 09/05/24 0702  BP:  (!) 99/52 (!) 57/38   Pulse:  88    Resp:  18    Temp:  98.1 F (36.7 C)    TempSrc:  Oral    SpO2:  100%    Weight: 35.8 kg   36.3 kg  Height:        Examination:  GENERAL: Appears frail and chronically ill. HEENT: MMM.  Vision and hearing grossly intact.  NECK:  Supple.  No apparent JVD.  RESP:  No IWOB.  Fair aeration bilaterally. CVS:  RRR. Heart sounds normal.  ABD/GI/GU: BS+. Abd soft, NTND.  MSK/EXT:  Moves extremities.  Significant muscle mass and subcu fat loss.  AVF to right arm SKIN: Broken bulla on left forearm NEURO: AA.  Oriented appropriately.  No apparent focal neuro deficit. PSYCH: Calm. Normal affect.   Consultants:  Nephrology Critical care Cardiology Vascular surgery Palliative medicine  Procedures: None  Microbiology summarized: 9/4-COVID-19, influenza and RSV PCR nonreactive 9/4-A 20 pathogen RVP nonreactive 9/4-blood cultures NGTD  Assessment and plan: Acute hypoxic respiratory failure due to pulmonary edema-improved.  Currently saturating at 100% on 3 L by [place]. Difficulty obtaining saturations.  Repeat CXR with pulmonary edema.  Pneumonia ruled out. -Patient had serial daily HD with ultrafiltration since admission. -Plan for hemodialysis again today -Wean oxygen as able.  Goal O2 saturation > 88%   Acute on Chronic heart failure/biventricular heart failure: TTE with LVEF of 30 to 35%, severely reduced RVF, sev TR, mod-sev AR and moderate MR.  Repeat CXR this morning with  pulmonary edema.  Low BP 257/38 this morning likely erroneous entry. -Plan for HD with ultrafiltration -Cardiology signed off.  Mod mitral valve regurg Sev tricuspid valve regurg Mod-sev aortic valve regurg  -Deemed to be not candidate for interventional therapies due to cocaine  use and overall risk profile.  -Appreciate cardiology input -Volume removal  via dialysis.    Atrial fibrillation/flutter:  Not on AC due to history of SDH. -Continue amiodarone  400 mg twice daily for now.  Will clarify discharge dose with cardiology. -Optimize electrolytes -Cardiology signed off.   ESRD on HD TTS Lupus nephritis  AGMA Hyperkalemia Hypocalcemia  -HD and correction per nephrology. -Nephrology following. Dialysis today   IDDM with level 1  hypoglycemia: A1c 6.1%. Recent Labs  Lab 09/04/24 2254 09/05/24 0025 09/05/24 0439 09/05/24 0452 09/05/24 0755  GLUCAP 128* 183* 68* 201* 95  -Continue D10 infusion at 30 cc an hour -CBG monitoring every 6 hours   Severe protein calorie malnutrition Unintentional weight loss Failure to thrive - Palliative medicine following.  Currently full code with full scope of care Body mass index is 14.65 kg/m. Nutrition Problem: Severe Malnutrition Etiology: chronic illness (ESRD, HFrEF) Signs/Symptoms: severe fat depletion, severe muscle depletion Interventions: Magic cup, MVI, Nepro shake, Refer to RD note for recommendations  Generalized weakness/physical deconditioning: Reports independent ambulating at baseline. - PT/OT eval    DVT prophylaxis:  heparin  injection 5,000 Units Start: 08/31/24 1400 SCDs Start: 08/31/24 1052  Code Status: Full code Family Communication: None at bedside Level of care: Telemetry Medical Status is: Inpatient Remains inpatient appropriate because: Acute on chronic HFrEF/BiV failure, respiratory failure   Final disposition: To be determined   55 minutes with more than 50% spent in reviewing records, counseling patient/family and coordinating care.   Sch Meds:  Scheduled Meds:  amiodarone   400 mg Oral BID   Chlorhexidine  Gluconate Cloth  6 each Topical Q0600   dextrose        feeding supplement (NEPRO CARB STEADY)  237 mL Oral TID BM   heparin   5,000 Units Subcutaneous Q8H   midodrine   10 mg Oral TID WC   multivitamin  1 tablet Oral QHS   mupirocin  ointment   Nasal BID   mouth rinse  15 mL Mouth Rinse 4 times per day   oxidized cellulose  1 each Topical Once   sertraline   25 mg Oral Daily   Continuous Infusions:  dextrose  30 mL/hr at 09/05/24 0504   PRN Meds:.acetaminophen , dextrose , docusate sodium , melatonin, mouth rinse, polyethylene glycol  Antimicrobials: Anti-infectives (From admission, onward)    Start     Dose/Rate Route  Frequency Ordered Stop   09/02/24 0900  Levofloxacin  (LEVAQUIN ) IVPB 250 mg  Status:  Discontinued        250 mg 50 mL/hr over 60 Minutes Intravenous Every 48 hours 08/31/24 1828 09/01/24 1024   08/31/24 0915  levofloxacin  (LEVAQUIN ) IVPB 750 mg        750 mg 100 mL/hr over 90 Minutes Intravenous  Once 08/31/24 0913 08/31/24 1120        I have personally reviewed the following labs and images: CBC: Recent Labs  Lab 08/31/24 0725 08/31/24 0753 08/31/24 1822 09/01/24 0413 09/02/24 0817 09/03/24 0236  WBC 8.7  --   --  6.7 7.4 7.0  NEUTROABS 5.6  --   --   --   --   --   HGB 13.6 16.0* 12.6 12.0 12.3 13.1  HCT 43.1 47.0* 37.0 36.5 38.1 39.6  MCV 88.0  --   --  84.3 85.2 83.4  PLT 191  --   --  164 147* 159   BMP &GFR Recent Labs  Lab 08/31/24 0725 08/31/24 0753 08/31/24 1227 08/31/24 1822 09/01/24 0413 09/02/24 0817 09/03/24 0236  NA 134* 129*  --  132* 135 132* 132*  K 5.5*  5.3*  --  3.8 4.7 4.2 3.8  CL 91* 101  --   --  92* 91* 92*  CO2 18*  --   --   --  23 21* 24  GLUCOSE 42* 41*  --   --  81 113* 96  BUN 49* 55*  --   --  39* 30* 24*  CREATININE 7.17* 7.20* 7.72*  --  5.70* 4.34* 3.62*  CALCIUM  9.5  --   --   --  9.9 9.4 9.7  PHOS  --   --   --   --  6.1*  --   --    Estimated Creatinine Clearance: 11.8 mL/min (A) (by C-G formula based on SCr of 3.62 mg/dL (H)). Liver & Pancreas: No results for input(s): AST, ALT, ALKPHOS, BILITOT, PROT, ALBUMIN  in the last 168 hours. No results for input(s): LIPASE, AMYLASE in the last 168 hours. No results for input(s): AMMONIA in the last 168 hours. Diabetic: No results for input(s): HGBA1C in the last 72 hours. Recent Labs  Lab 09/04/24 2254 09/05/24 0025 09/05/24 0439 09/05/24 0452 09/05/24 0755  GLUCAP 128* 183* 68* 201* 95   Cardiac Enzymes: No results for input(s): CKTOTAL, CKMB, CKMBINDEX, TROPONINI in the last 168 hours. No results for input(s): PROBNP in the last 8760  hours. Coagulation Profile: Recent Labs  Lab 09/01/24 1554  INR 1.8*   Thyroid Function Tests: No results for input(s): TSH, T4TOTAL, FREET4, T3FREE, THYROIDAB in the last 72 hours. Lipid Profile: No results for input(s): CHOL, HDL, LDLCALC, TRIG, CHOLHDL, LDLDIRECT in the last 72 hours. Anemia Panel: No results for input(s): VITAMINB12, FOLATE, FERRITIN, TIBC, IRON , RETICCTPCT in the last 72 hours. Urine analysis:    Component Value Date/Time   COLORURINE YELLOW 09/25/2015 1412   APPEARANCEUR CLOUDY (A) 09/25/2015 1412   LABSPEC 1.018 09/25/2015 1412   PHURINE 8.0 09/25/2015 1412   GLUCOSEU NEGATIVE 09/25/2015 1412   HGBUR LARGE (A) 09/25/2015 1412   BILIRUBINUR NEGATIVE 09/25/2015 1412   KETONESUR NEGATIVE 09/25/2015 1412   PROTEINUR >300 (A) 09/25/2015 1412   UROBILINOGEN 0.2 09/25/2015 1412   NITRITE NEGATIVE 09/25/2015 1412   LEUKOCYTESUR MODERATE (A) 09/25/2015 1412   Sepsis Labs: Invalid input(s): PROCALCITONIN, LACTICIDVEN  Microbiology: Recent Results (from the past 240 hours)  Resp panel by RT-PCR (RSV, Flu A&B, Covid) Anterior Nasal Swab     Status: None   Collection Time: 08/31/24  8:54 AM   Specimen: Anterior Nasal Swab  Result Value Ref Range Status   SARS Coronavirus 2 by RT PCR NEGATIVE NEGATIVE Final   Influenza A by PCR NEGATIVE NEGATIVE Final   Influenza B by PCR NEGATIVE NEGATIVE Final    Comment: (NOTE) The Xpert Xpress SARS-CoV-2/FLU/RSV plus assay is intended as an aid in the diagnosis of influenza from Nasopharyngeal swab specimens and should not be used as a sole basis for treatment. Nasal washings and aspirates are unacceptable for Xpert Xpress SARS-CoV-2/FLU/RSV testing.  Fact Sheet for Patients: BloggerCourse.com  Fact Sheet for Healthcare Providers: SeriousBroker.it  This test is not yet approved or cleared by the [place]  FDA and has been  authorized for detection and/or diagnosis of SARS-CoV-2 by FDA under an Emergency Use Authorization (EUA). This EUA will remain in effect (meaning this test can be used) for the duration of the COVID-19 declaration under Section 564(b)(1) of the Act, 21 U.S.C. section 360bbb-3(b)(1), unless the authorization is terminated or revoked.     Resp Syncytial Virus by PCR NEGATIVE NEGATIVE Final  Comment: (NOTE) Fact Sheet for Patients: BloggerCourse.com  Fact Sheet for Healthcare Providers: SeriousBroker.it  This test is not yet approved or cleared by the [place]  FDA and has been authorized for detection and/or diagnosis of SARS-CoV-2 by FDA under an Emergency Use Authorization (EUA). This EUA will remain in effect (meaning this test can be used) for the duration of the COVID-19 declaration under Section 564(b)(1) of the Act, 21 U.S.C. section 360bbb-3(b)(1), unless the authorization is terminated or revoked.  Performed at [facility], [address], [place], [place] [address]   MRSA Next Gen by PCR, Nasal     Status: Abnormal   Collection Time: 08/31/24 12:04 PM   Specimen: Nasal Mucosa; Nasal Swab  Result Value Ref Range Status   MRSA by PCR Next Gen DETECTED (A) NOT DETECTED Final    Comment: RESULT CALLED TO, READ BACK BY AND VERIFIED WITH: RN TERESA CRITE ON 08/31/24 @ 1420 BY DRT (NOTE) The GeneXpert MRSA Assay (FDA approved for NASAL specimens only), is one component of a comprehensive MRSA colonization surveillance program. It is not intended to diagnose MRSA infection nor to guide or monitor treatment for MRSA infections. Test performance is not FDA approved in patients less than 86 years old. Performed at [facility], [address], [place], [place] [address]   Culture, blood (Routine X 2) w Reflex to ID Panel     Status: None   Collection Time: 08/31/24 12:23 PM   Specimen: BLOOD  Result Value  Ref Range Status   Specimen Description BLOOD SITE NOT SPECIFIED  Final   Special Requests   Final    BOTTLES DRAWN AEROBIC ONLY Blood Culture results may not be optimal due to an inadequate volume of blood received in culture bottles   Culture   Final    NO GROWTH 5 DAYS Performed at [facility], [address] [place], [place] [address]    Report Status 09/05/2024 FINAL  Final  Culture, blood (Routine X 2) w Reflex to ID Panel     Status: None   Collection Time: 08/31/24 12:23 PM   Specimen: BLOOD  Result Value Ref Range Status   Specimen Description BLOOD SITE NOT SPECIFIED  Final   Special Requests   Final    BOTTLES DRAWN AEROBIC ONLY Blood Culture adequate volume   Culture   Final    NO GROWTH 5 DAYS Performed at [facility], [address], [place], [place] [address]    Report Status 09/05/2024 FINAL  Final  Respiratory (~20 pathogens) panel by PCR     Status: None   Collection Time: 08/31/24  2:01 PM   Specimen: Nasopharyngeal Swab; Respiratory  Result Value Ref Range Status   Adenovirus NOT DETECTED NOT DETECTED Final   Coronavirus 229E NOT DETECTED NOT DETECTED Final    Comment: (NOTE) The Coronavirus on the Respiratory Panel, DOES NOT test for the novel  Coronavirus (2019 nCoV)    Coronavirus HKU1 NOT DETECTED NOT DETECTED Final   Coronavirus NL63 NOT DETECTED NOT DETECTED Final   Coronavirus OC43 NOT DETECTED NOT DETECTED Final   Metapneumovirus NOT DETECTED NOT DETECTED Final   Rhinovirus / Enterovirus NOT DETECTED NOT DETECTED Final   Influenza A NOT DETECTED NOT DETECTED Final   Influenza B NOT DETECTED NOT DETECTED Final   Parainfluenza Virus 1 NOT DETECTED NOT DETECTED Final   Parainfluenza Virus 2 NOT DETECTED NOT DETECTED Final   Parainfluenza Virus 3 NOT DETECTED  NOT DETECTED Final   Parainfluenza Virus 4 NOT DETECTED NOT DETECTED Final   Respiratory Syncytial Virus NOT DETECTED NOT DETECTED Final   Bordetella pertussis NOT DETECTED NOT  DETECTED Final   Bordetella Parapertussis NOT DETECTED NOT DETECTED Final   Chlamydophila pneumoniae NOT DETECTED NOT DETECTED Final   Mycoplasma pneumoniae NOT DETECTED NOT DETECTED Final    Comment: Performed at [facility], [address] [place], [place] [address]    Radiology Studies: No results found.    Garrett Bowring T. Jefrey Raburn [facility]  If 7PM-7AM, please contact night-coverage [email or web address] 09/05/2024, 10:30 AM

## 2024-09-05 NOTE — Progress Notes (Addendum)
 [facility] Progress Note   Subjective:    Patient transferred off the ICU and now on the medical floor. Seen and examined patient at bedside. 1.5L was removed during yesterday's HD. Noted patient had prolong bleeding after removing venous needle. VVS was consulted and a stitch, manual pressure, and dressing were applied to achieve homeostasis. HD ran for only 2.5hrs. Seen and examined patient at bedside. She reports feeling much better. Denies SOB and CP. Plan for HD sometime today to get her back on TTS schedule. Will try accessing AVF. If cannulation is unsuccessful and/or prolong bleeding occurs, will reach back out to VVS. Per notes, will need TDC placement as the next step.  Objective Vitals:   09/04/24 2014 09/04/24 2015 09/05/24 0438 09/05/24 0700  BP: 107/85  (!) 99/52 (!) 57/38  Pulse: 85  88   Resp: 20  18   Temp: (!) 97.4 F (36.3 C)  98.1 F (36.7 C)   TempSrc: Oral  Oral   SpO2: 95%  100%   Weight:  35.8 kg    Height:       Physical Exam General: Awake, alert, NAD, cachectic, ill-appearing, appears to be on RA at this time Heart: RRR; No MRGs Lungs: normal WOB, more clear posteriorly Abdomen: Soft and non-tender Extremities: No LE edema Dialysis Access: AVF +t/b on weaker side but present, not pulsatile  Filed Weights   09/04/24 0815 09/04/24 1328 09/04/24 2015  Weight: 33.7 kg 32.2 kg 35.8 kg    Intake/Output Summary (Last 24 hours) at 09/05/2024 0910 Last data filed at 09/05/2024 0400 Gross per 24 hour  Intake 584.32 ml  Output 1500 ml  Net -915.68 ml    Additional Objective Labs: Basic Metabolic Panel: Recent Labs  Lab 09/01/24 0413 09/02/24 0817 09/03/24 0236  NA 135 132* 132*  K 4.7 4.2 3.8  CL 92* 91* 92*  CO2 23 21* 24  GLUCOSE 81 113* 96  BUN 39* 30* 24*  CREATININE 5.70* 4.34* 3.62*  CALCIUM  9.9 9.4 9.7  PHOS 6.1*  --   --    Liver Function Tests: No results for input(s): AST, ALT, ALKPHOS, BILITOT, PROT,  ALBUMIN  in the last 168 hours. No results for input(s): LIPASE, AMYLASE in the last 168 hours. CBC: Recent Labs  Lab 08/31/24 0725 08/31/24 0753 09/01/24 0413 09/02/24 0817 09/03/24 0236  WBC 8.7  --  6.7 7.4 7.0  NEUTROABS 5.6  --   --   --   --   HGB 13.6   < > 12.0 12.3 13.1  HCT 43.1   < > 36.5 38.1 39.6  MCV 88.0  --  84.3 85.2 83.4  PLT 191  --  164 147* 159   < > = values in this interval not displayed.   Blood Culture    Component Value Date/Time   SDES BLOOD SITE NOT SPECIFIED 08/31/2024 1223   SDES BLOOD SITE NOT SPECIFIED 08/31/2024 1223   SPECREQUEST  08/31/2024 1223    BOTTLES DRAWN AEROBIC ONLY Blood Culture results may not be optimal due to an inadequate volume of blood received in culture bottles   SPECREQUEST  08/31/2024 1223    BOTTLES DRAWN AEROBIC ONLY Blood Culture adequate volume   CULT  08/31/2024 1223    NO GROWTH 4 DAYS Performed at [facility], [address], [place], [place] [address]    CULT  08/31/2024 1223    NO GROWTH 4 DAYS Performed at [facility], [address] [place], [place] [address]    REPTSTATUS PENDING 08/31/2024 1223   REPTSTATUS PENDING 08/31/2024 1223    Cardiac Enzymes: No results for input(s): CKTOTAL, CKMB, CKMBINDEX, TROPONINI in the last 168 hours. CBG: Recent Labs  Lab 09/04/24 2254 09/05/24 0025 09/05/24 0439 09/05/24 0452 09/05/24 0755  GLUCAP 128* 183* 68* 201* 95   Iron  Studies: No results for input(s): IRON , TIBC, TRANSFERRIN, FERRITIN in the last 72 hours. Lab Results  Component Value Date   INR 1.8 (H) 09/01/2024   INR 1.4 (H) 12/16/2023   INR 1.5 (H) 12/13/2023   Studies/Results: DG CHEST PORT 1 VIEW Result Date: 09/03/2024 CLINICAL DATA:  Hypoxemia. EXAM: PORTABLE CHEST 1 VIEW COMPARISON:  August 31, 2024. FINDINGS: Stable cardiomegaly. Stable diffuse interstitial and airspace opacities are noted concerning for pulmonary edema or diffuse atypical infection.  Old right rib fracture is noted. Possible small left pleural effusion. IMPRESSION: Stable diffuse interstitial and airspace opacities are noted concerning for pulmonary edema or diffuse atypical infection. Electronically Signed   By: Lynwood Landy Raddle M.D.   On: 09/03/2024 10:52    Medications:  dextrose  30 mL/hr at 09/05/24 0504    amiodarone   400 mg Oral BID   Chlorhexidine  Gluconate Cloth  6 each Topical Q0600   dextrose        feeding supplement (NEPRO CARB STEADY)  237 mL Oral TID BM   heparin   5,000 Units Subcutaneous Q8H   midodrine   10 mg Oral TID WC   multivitamin  1 tablet Oral QHS   mupirocin  ointment   Nasal BID   mouth rinse  15 mL Mouth Rinse 4 times per day   oxidized cellulose  1 each Topical Once   sertraline   25 mg Oral Daily    Dialysis Orders: [facility] 3h  B400   37.5kg   2K bath  AVF  Heparin  none Post 9/02 wt 38.2kg, usual comes off 0-1.5kg over Hb 12, no esa   Home bp meds: Midodrine  10 tid  Assessment/Plan: SOB/ pulm edema: per CXR. Not grossly overloaded, O2 appears to be slowly weaning down and now on RA in her room this morning, not in distress. Had HD in ICU Friday and Saturday last week, low UF each session d/t low bp's. Pressors are off and remains on Midodrine  TID. Using IV Albumin  for additional BP support.  ESRD: on HD TTS. Received HD yesterday 2.5hrs, 1.5L was removed. Plan for HD sometime today to get her back on TTS schedule. See below for access plan Vascular Access: Noted AVF started bleeding after venous needle was pulled. VVS on board. A stitch, manual pressure, and dressing were applied. Will try using the access with HD today. Will reach back out to VVS if problems re-occur. She will need TDC placement at that point. Hypotension: Cardiac in origin.  midodrine  continuing here, levo off Volume: not grossly vol overloaded, lower vol as tol - has had several extra HD treatments here to lower volume. 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  Chronic systolic HF/  RV dysfunction/ valve disease: not a candidate for surgical or other invasive therapies due to ongoing cocaine  and high risk  A fib/FL: on amio, no BB due to hypotension, no anticoag due to h/o subdural hematoma.   [name] Piety, NP [facility] 09/05/2024,9:10 AM  LOS: 5 days

## 2024-09-06 DIAGNOSIS — E872 Acidosis, unspecified: Secondary | ICD-10-CM | POA: Diagnosis not present

## 2024-09-06 DIAGNOSIS — E162 Hypoglycemia, unspecified: Secondary | ICD-10-CM | POA: Diagnosis not present

## 2024-09-06 DIAGNOSIS — T8241XA Breakdown (mechanical) of vascular dialysis catheter, initial encounter: Secondary | ICD-10-CM

## 2024-09-06 DIAGNOSIS — Z515 Encounter for palliative care: Secondary | ICD-10-CM | POA: Diagnosis not present

## 2024-09-06 DIAGNOSIS — Z7189 Other specified counseling: Secondary | ICD-10-CM | POA: Diagnosis not present

## 2024-09-06 LAB — RENAL FUNCTION PANEL
Albumin: 2.4 g/dL — ABNORMAL LOW (ref 3.5–5.0)
Anion gap: 14 (ref 5–15)
BUN: 30 mg/dL — ABNORMAL HIGH (ref 6–20)
CO2: 25 mmol/L (ref 22–32)
Calcium: 9.4 mg/dL (ref 8.9–10.3)
Chloride: 93 mmol/L — ABNORMAL LOW (ref 98–111)
Creatinine, Ser: 3.78 mg/dL — ABNORMAL HIGH (ref 0.44–1.00)
GFR, Estimated: 15 mL/min — ABNORMAL LOW (ref >60)
Glucose, Bld: 91 mg/dL (ref 70–99)
Phosphorus: 4.3 mg/dL (ref 2.5–4.6)
Potassium: 4.2 mmol/L (ref 3.5–5.1)
Sodium: 132 mmol/L — ABNORMAL LOW (ref 135–145)

## 2024-09-06 LAB — CBC
HCT: 36 % (ref 36.0–46.0)
Hemoglobin: 11.9 g/dL — ABNORMAL LOW (ref 12.0–15.0)
MCH: 27.9 pg (ref 26.0–34.0)
MCHC: 33.1 g/dL (ref 30.0–36.0)
MCV: 84.3 fL (ref 80.0–100.0)
Platelets: 130 K/uL — ABNORMAL LOW (ref 150–400)
RBC: 4.27 MIL/uL (ref 3.87–5.11)
RDW: 18.6 % — ABNORMAL HIGH (ref 11.5–15.5)
WBC: 8.5 K/uL (ref 4.0–10.5)
nRBC: 0.6 % — ABNORMAL HIGH (ref 0.0–0.2)

## 2024-09-06 LAB — GLUCOSE, CAPILLARY
Glucose-Capillary: 106 mg/dL — ABNORMAL HIGH (ref 70–99)
Glucose-Capillary: 100 mg/dL — ABNORMAL HIGH (ref 70–99)
Glucose-Capillary: 162 mg/dL — ABNORMAL HIGH (ref 70–99)
Glucose-Capillary: 57 mg/dL — ABNORMAL LOW (ref 70–99)
Glucose-Capillary: 64 mg/dL — ABNORMAL LOW (ref 70–99)
Glucose-Capillary: 69 mg/dL — ABNORMAL LOW (ref 70–99)
Glucose-Capillary: 107 mg/dL — ABNORMAL HIGH (ref 70–99)

## 2024-09-06 LAB — MAGNESIUM: Magnesium: 2.2 mg/dL (ref 1.7–2.4)

## 2024-09-06 LAB — LACTIC ACID, PLASMA: Lactic Acid, Venous: 2.7 mmol/L (ref 0.5–1.9)

## 2024-09-06 MED ORDER — LORAZEPAM 0.5 MG PO TABS
0.2500 mg | ORAL_TABLET | Freq: Four times a day (QID) | ORAL | Status: DC | PRN
  Administered 2024-09-06 – 2024-09-14 (×14): 0.25 mg via ORAL
  Filled 2024-09-06 (×16): qty 1

## 2024-09-06 MED ORDER — CHLORHEXIDINE GLUCONATE CLOTH 2 % EX PADS
6.0000 | MEDICATED_PAD | Freq: Every day | CUTANEOUS | Status: DC
  Administered 2024-09-07: 6 via TOPICAL

## 2024-09-06 NOTE — Progress Notes (Signed)
 This chaplain is present for F/U on the Pt. Advance Directive: HCPOA. The Pt. is awake with her eyes remaining closed. The Pt. agreed to reviewing and answering the chaplain's clarifying questions from PMT NP-Eric previous AD education.   The PMT Pt. continues to identify her mother-Brenda Ruben as her choice for HCPOA. The Pt. chose to pause on completing HCPOA until [place] visits today. The chaplain invited the Pt. to ask the RN to page the chaplain when the Pt. is ready to complete the AD.  Chaplain Leeroy Hummer [phone]

## 2024-09-06 NOTE — Progress Notes (Signed)
 [facility] Progress Note   Subjective:    Seen and examined patient at bedside. She is leeping but answering my questions. Sje remains on RA and denies SOB and CP. Bps are stable. Tolerated yesterday's HD and 1L was removed. Per patient, there were no issues with bleeding at yesterday's HD. Continue to monitor for signs of bleeding. Next HD 9/11.   Objective Vitals:   09/05/24 1825 09/05/24 2022 09/05/24 2033 09/06/24 0426  BP:  (!) 149/56  (!) 111/59  Pulse: 74 81 83 88  Resp: (!) 25 19 20 20   Temp:  97.9 F (36.6 C)  98.3 F (36.8 C)  TempSrc:  Oral    SpO2: 96% (!) 69% 97% 99%  Weight:      Height:       Physical Exam General: Awake, alert, NAD, cachectic, ill-appearing, on RA Heart: RRR; No MRGs Lungs: normal WOB, more clear posteriorly Abdomen: Soft and non-tender Extremities: No LE edema Dialysis Access: AVF +t/b on weaker side but present, not pulsatile  Filed Weights   09/05/24 0702 09/05/24 1401 09/05/24 1822  Weight: 36.3 kg 36 kg 34.5 kg    Intake/Output Summary (Last 24 hours) at 09/06/2024 1237 Last data filed at 09/06/2024 0600 Gross per 24 hour  Intake 987.62 ml  Output 1000 ml  Net -12.38 ml    Additional Objective Labs: Basic Metabolic Panel: Recent Labs  Lab 09/01/24 0413 09/02/24 0817 09/03/24 0236 09/05/24 1410 09/06/24 0332  NA 135   < > 132* 127* 132*  K 4.7   < > 3.8 4.1 4.2  CL 92*   < > 92* 88* 93*  CO2 23   < > 24 24 25   GLUCOSE 81   < > 96 126* 91  BUN 39*   < > 24* 47* 30*  CREATININE 5.70*   < > 3.62* 5.04* 3.78*  CALCIUM  9.9   < > 9.7 9.3 9.4  PHOS 6.1*  --   --  5.5* 4.3   < > = values in this interval not displayed.   Liver Function Tests: Recent Labs  Lab 09/05/24 1410 09/06/24 0332  ALBUMIN  2.3* 2.4*   No results for input(s): LIPASE, AMYLASE in the last 168 hours. CBC: Recent Labs  Lab 08/31/24 0725 08/31/24 0753 09/01/24 0413 09/02/24 0817 09/03/24 0236 09/05/24 1410 09/06/24 0332   WBC 8.7  --  6.7 7.4 7.0 8.8 8.5  NEUTROABS 5.6  --   --   --   --   --   --   HGB 13.6   < > 12.0 12.3 13.1 11.7* 11.9*  HCT 43.1   < > 36.5 38.1 39.6 35.2* 36.0  MCV 88.0  --  84.3 85.2 83.4 83.8 84.3  PLT 191  --  164 147* 159 159 130*   < > = values in this interval not displayed.   Blood Culture    Component Value Date/Time   SDES BLOOD SITE NOT SPECIFIED 08/31/2024 1223   SDES BLOOD SITE NOT SPECIFIED 08/31/2024 1223   SPECREQUEST  08/31/2024 1223    BOTTLES DRAWN AEROBIC ONLY Blood Culture results may not be optimal due to an inadequate volume of blood received in culture bottles   SPECREQUEST  08/31/2024 1223    BOTTLES DRAWN AEROBIC ONLY Blood Culture adequate volume   CULT  08/31/2024 1223    NO GROWTH 5 DAYS Performed at [facility], [address] [place], [place] [address]    CULT  08/31/2024 1223    NO GROWTH 5 DAYS Performed at [facility], [address], [place], [place] [address]    REPTSTATUS 09/05/2024 FINAL 08/31/2024 1223   REPTSTATUS 09/05/2024 FINAL 08/31/2024 1223    Cardiac Enzymes: No results for input(s): CKTOTAL, CKMB, CKMBINDEX, TROPONINI in the last 168 hours. CBG: Recent Labs  Lab 09/05/24 1144 09/05/24 2019 09/06/24 0217 09/06/24 0424 09/06/24 1136  GLUCAP 90 107* 106* 100* 162*   Iron  Studies: No results for input(s): IRON , TIBC, TRANSFERRIN, FERRITIN in the last 72 hours. Lab Results  Component Value Date   INR 1.8 (H) 09/01/2024   INR 1.4 (H) 12/16/2023   INR 1.5 (H) 12/13/2023   Studies/Results: DG CHEST PORT 1 VIEW Result Date: 09/05/2024 CLINICAL DATA:  Shortness of breath. EXAM: PORTABLE CHEST 1 VIEW COMPARISON:  September 03, 2024 FINDINGS: The cardiac silhouette is enlarged and unchanged in size. Marked severity calcification of the aortic arch is seen. There is stable mild to moderate severity diffuse interstitial and airspace opacities. There is a small left pleural effusion. No pneumothorax  is identified. The visualized skeletal structures are unremarkable. IMPRESSION: Stable cardiomegaly with findings likely consistent with stable mild to moderate severity pulmonary edema. Electronically Signed   By: Suzen Dials M.D.   On: 09/05/2024 10:37    Medications:  dextrose  30 mL/hr at 09/05/24 0504    amiodarone   400 mg Oral BID   Chlorhexidine  Gluconate Cloth  6 each Topical Q0600   feeding supplement (NEPRO CARB STEADY)  237 mL Oral TID BM   heparin   5,000 Units Subcutaneous Q8H   midodrine   10 mg Oral TID WC   multivitamin  1 tablet Oral QHS   mouth rinse  15 mL Mouth Rinse 4 times per day   oxidized cellulose  1 each Topical Once   sertraline   25 mg Oral Daily    Dialysis Orders: 3h  B400   37.5kg   2K bath  AVF  Heparin  none Post 9/02 wt 38.2kg, usual comes off 0-1.5kg over Hb 12, no esa   Home bp meds: Midodrine  10 tid  Assessment/Plan: SOB/ pulm edema: per CXR. Not grossly overloaded, O2 appears to be slowly weaning down and now on RA in her room this morning, not in distress. Had HD in ICU Friday and Saturday last week, low UF each session d/t low bp's. Pressors are off and remains on Midodrine  TID. Using IV Albumin  for additional BP support.  ESRD: on HD TTS. Back on schedule. Next HD 9/11. See below for access plan Vascular Access: Noted AVF started bleeding on Monday after venous needle was pulled. VVS on board. A stitch, manual pressure, and dressing were applied. Patient denies any issues with bleeding at yesterday's HD. Reach back out to VVS if problems were to re-occur. She will need TDC placement at that point. Hypotension: Cardiac in origin.  midodrine  continuing here, levo off Volume: not grossly vol overloaded, lower vol as tol - has had several extra HD treatments here to lower volume. Ordered repeat CXR today Anemia of esrd: Hb 12-15, not on esa at OP unit Secondary hyperPTH: phos reasonable at 6.1.  Chronic systolic HF/  RV dysfunction/ valve  disease: not a candidate for surgical or other invasive therapies due to ongoing cocaine  and high risk  A fib/FL: on amio, no BB due to hypotension, no anticoag due to h/o subdural hematoma.   Charmaine Piety, NP [facility] 09/06/2024,12:37 PM  LOS: 6 days

## 2024-09-06 NOTE — Evaluation (Signed)
 Occupational Therapy Evaluation Patient Details Name: [name] MRN: [id number] DOB: [birth year] Today's Date: 09/06/2024   History of Present Illness   Pt is a 43 y.o. female admitted 9/4 for acute respiratory failure with hypoxia. 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 Pt now presents with significantly decreased activity tolerance, increased lethargy, increased fatigue, B UE generalized weakness, decreased balance, and decreased safety and independence with functional tasks. Pt currently demonstrates ability to complete ADLs with Set up to Min assist. Pt made good attempt to participate in OT eval, but was significantly limited by lethargy and fatigue this session with pt frequently beginning to fall asleep mid-sentence or mid-movement without verbal or tactile cues to alert. Pt will benefit from acute OT to address deficits and increase safety and independence with functional tasks. Post acute discharge, pt will benefit from intensive inpatient skilled rehab services < 3 hours per day to maximize rehab potential.      If plan is discharge home, recommend the following:   A little help with walking and/or transfers;A little help with bathing/dressing/bathroom;Assistance with cooking/housework;Assist for transportation;Help with stairs or ramp for entrance     Functional Status Assessment   Patient has had a recent decline in their functional status and demonstrates the ability to make significant improvements in function in a reasonable and predictable amount of time.     Equipment Recommendations   BSC/3in1;Other (comment) (Other TBD based on pt progress)     Recommendations for Other Services         Precautions/Restrictions    Precautions Precautions: Fall Recall of Precautions/Restrictions: Intact Restrictions Weight Bearing Restrictions Per Provider Order: No     Mobility Bed Mobility Overal bed mobility: Needs Assistance Bed Mobility: Rolling, Supine to Sit, Sit to Supine Rolling: Contact guard assist   Supine to sit: Min assist Sit to supine: Contact guard assist   General bed mobility comments: Assist to elevate trunk into sitting    Transfers Overall transfer level: Needs assistance                 General transfer comment: OOB activity deferred this session for pt/therapist due to pt lethargy and fatigue      Balance Overall balance assessment: Needs assistance Sitting-balance support: Single extremity supported, Bilateral upper extremity supported, Feet unsupported Sitting balance-Leahy Scale: Fair Sitting balance - Comments: Pt requiring UE support to maintain balance at EOB; however, pt remained very lethargic in sitting.                                   ADL either performed or assessed with clinical judgement   ADL Overall ADL's : Needs assistance/impaired Eating/Feeding: Set up;Sitting   Grooming: Set up;Sitting   Upper Body Bathing: Set up;Sitting Upper Body Bathing Details (indicate cue type and reason): simulated Lower Body Bathing: Minimal assistance;Contact guard assist;Sitting/lateral leans Lower Body Bathing Details (indicate cue type and reason): simulated Upper Body Dressing : Contact guard assist;Sitting   Lower Body Dressing: Minimal assistance;Sitting/lateral leans     Toilet Transfer Details (indicate cue type and reason): deferred for pt/therapist safety due to pt lethargy and fatigue           General  ADL Comments: Pt functional level significantly limited by lethargy and fatigue this session.     Vision Patient Visual Report: No change from baseline Additional Comments: [facility] for tasks assessed; not formally screened or  evaluated     Perception         Praxis         Pertinent Vitals/Pain Pain Assessment Pain Assessment: Faces Faces Pain Scale: Hurts little more Pain Location: L elbow with extension Pain Descriptors / Indicators: Aching, Grimacing, Discomfort Pain Intervention(s): Limited activity within patient's tolerance, Monitored during session, Repositioned     Extremity/Trunk Assessment Upper Extremity Assessment Upper Extremity Assessment: Right hand dominant;Generalized weakness;RUE deficits/detail;LUE deficits/detail RUE Deficits / Details: generalized weakness LUE Deficits / Details: generalized weakness; wound on radial side of proximal forearm with pt reporting it was a bubble that burst yesterday, RN is aware; pt reports pain in forearm at site of wound with elbow AROM flexion/extension   Lower Extremity Assessment Lower Extremity Assessment: Defer to PT evaluation   Cervical / Trunk Assessment Cervical / Trunk Assessment: Kyphotic   Communication Communication Communication: No apparent difficulties   Cognition Arousal: Lethargic Behavior During Therapy: WFL for tasks assessed/performed Cognition: No apparent impairments             OT - Cognition Comments: Pt oriented x4 and appropriately responding to all questions and instructions, but quickly drifting off to sleep without constant verbal or tactile stimulation. Pt reports she has had a busy day with PT earlier, many visits from nursing staff, and visits from the Chaplain and her MD.                 Following commands: Intact       Cueing  General Comments   Cueing Techniques: Verbal cues  VSS on RA   Exercises     Shoulder Instructions      Home Living Family/patient expects to be discharged to:: Private residence Living Arrangements: Parent Available Help at Discharge: Family;Available PRN/intermittently Type of Home: Apartment Home Access: Stairs to enter Entrance Stairs-Number of Steps:  16 Entrance Stairs-Rails: Right;Left;Can reach both Home Layout: One level     Bathroom Shower/Tub: Chief Strategy Officer: Standard Bathroom Accessibility: Yes How Accessible: Accessible via walker Home Equipment: Shower seat          Prior Functioning/Environment Prior Level of Function : Independent/Modified Independent             Mobility Comments: does not use DME ADLs Comments: fatigues at quickly, but can do everything for herself    OT Problem List: Decreased strength;Decreased activity tolerance;Impaired balance (sitting and/or standing)   OT Treatment/Interventions: Self-care/ADL training;Therapeutic exercise;Energy conservation;DME and/or AE instruction;Therapeutic activities;Patient/family education;Balance training      OT Goals(Current goals can be found in the care plan section)   Acute Rehab OT Goals Patient Stated Goal: to get some sleep OT Goal Formulation: With patient Time For Goal Achievement: 09/20/24 Potential to Achieve Goals: Fair ADL Goals Pt Will Perform Grooming: with modified independence;standing Pt Will Perform Upper Body Bathing: with modified independence;sitting Pt Will Perform Lower Body Bathing: with modified independence;sitting/lateral leans;sit to/from stand (with AE for energy conservation as needed) Pt Will Perform Lower Body Dressing: with modified independence;sitting/lateral leans;sit to/from stand (with AE for energy conservation as needed) Pt Will Transfer to Toilet: with modified independence;ambulating;bedside commode (with least restrictive AD) Additional ADL Goal #1: Patient will demonstrate ability to participate in 5 or more minutes of a functional or therapeutic task  without the need for a rest break. Additional ADL Goal #2: Patient will demonstrate ability to independently state 4 energy conservation strategies to increase safety and independence with functional tasks.   OT Frequency:  Min 2X/week     Co-evaluation              AM-PAC OT 6 Clicks Daily Activity     Outcome Measure Help from another person eating meals?: A Little Help from another person taking care of personal grooming?: A Little Help from another person toileting, which includes using toliet, bedpan, or urinal?: A Little Help from another person bathing (including washing, rinsing, drying)?: A Little Help from another person to put on and taking off regular upper body clothing?: A Little Help from another person to put on and taking off regular lower body clothing?: A Little 6 Click Score: 18   End of Session Nurse Communication: Mobility status;Other (comment) (Pt limited by lethargy and fatigue. RN aware of wound on L forearm)  Activity Tolerance: Patient limited by lethargy;Patient limited by fatigue Patient left: in bed;with call bell/phone within reach;with bed alarm set  OT Visit Diagnosis: Other abnormalities of gait and mobility (R26.89);Muscle weakness (generalized) (M62.81)                Time: 8368-8353 OT Time Calculation (min): 15 min Charges:  OT General Charges $OT Visit: 1 Visit OT Evaluation $OT Eval Low Complexity: 1 Low  Margarie Rockey HERO., OTR/L, MA Acute Rehab [phone]   Margarie FORBES Horns 09/06/2024, 5:40 PM

## 2024-09-06 NOTE — Progress Notes (Signed)
 Daily Progress Note   Date: 09/06/2024   Patient Name: [name]  DOB: 17-Oct-1981  MRN: [id number]  Age / Sex: 43 y.o., female  Attending Physician: Dino Antu, MD Primary Care Physician: Lorren Greig PARAS, NP Admit Date: 08/31/2024 Length of Stay: 6 days  Reason for Follow-up: Establishing goals of care  Past Medical History:  Diagnosis Date   Anemia    low iron  - receives iron  at dialysis   Anxiety    Arthritis    RA   Atrial fibrillation with RVR (HCC) 12/14/2023   Chronic systolic congestive heart failure (HCC) 03/16/2016   Dyspnea    ESRD (end stage renal disease) (HCC)    [facility] _ [place]   H/O pericarditis 01/17/2013   H/O pleural effusion 01/17/2013   Heart murmur    Lupus (systemic lupus erythematosus) (HCC)    Previously followed with Dr. 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 pneumonae bacteremia on 01/21/2013. Sensitive to Peniccilin     Subjective:   Subjective: Chart Reviewed. Updates received. Patient Assessed. Created space and opportunity for patient  and family to explore thoughts and feelings regarding current medical situation.  Today's Discussion: Today before meeting with the patient/family, I reviewed the chart notes including Note from Yesterday, Nursing Note from Yesterday, Internal Medicine Note from Yesterday, Chaplain Note from Today, Nephrology Note from Today. I also reviewed vital signs, nursing flowsheets, medication administrations record, labs,  and imaging. Labs reviewed include renal function panel which shows persistent but improved hyponatremia and 132, normal potassium at 4.2, improvement in creatinine to 3.78 all in the setting of ESRD on HD.  Albumin  low at 2.4 in the setting of chronic illness.  CBC shows continued normal white count at 8.7 in the setting of pneumonia.  Lactic acid acid remains elevated though is somewhat improved at 2.7 today in the setting of ESRD and questionable clearance of lactic on dialysis as well as chronic illness and acute illness with pneumonia.  Today prior to seeing the patient I discussed extensively with the nurse.  The patient is complaining of anxiety and I discussed adding a as needed anxiety medication.  Today saw the patient at bedside, she was sleeping and I elected to not wake her because of recent issues with pain and anxiety.  Per discussion with chaplain and nursing, it appears patient's mother will be coming to visit her sometime today.  She wishes to name her mother's HCPOA.  I requested nursing to notify us  when her mother arrive so we Conversations as well as notify the chaplain to come complete HCPOA documentation.  I also debriefed with the hospitalist regarding order for anxiety medication and management.  Review of Systems  Unable to perform ROS   Objective:   Primary Diagnoses: Present on Admission:  Acute respiratory failure with hypoxia (HCC)  Acute on chronic combined systolic and diastolic CHF (congestive heart failure) (HCC)  Atrial flutter (HCC)  Fluid overload  Hemodialysis catheter malfunction (HCC)  Insomnia  Lupus (systemic lupus erythematosus) (HCC)   Vital Signs:  BP (!) 111/59 (BP Location: Right Leg)   Pulse 88   Temp 98.3 F (36.8 C)   Resp 20   Ht 5' 2 (1.575 m)   Wt 34.5 kg   LMP  (LMP Unknown) Comment: states over 2 years ago  SpO2 99%   BMI 13.91 kg/m   Physical Exam Vitals and nursing note reviewed.  Constitutional:      General: She is  sleeping. She is not in acute distress.    Comments: Appears tired  HENT:     Head: Normocephalic and atraumatic.  Cardiovascular:     Rate and Rhythm: Normal rate.  Pulmonary:     Effort: Pulmonary effort is normal. No respiratory distress.  Abdominal:     General: Abdomen is flat. There is no distension.     Palpations: Abdomen is soft.  Skin:    General: Skin is warm and dry.  Neurological:     General: No focal deficit present.     Mental Status: She is easily aroused.  Psychiatric:        Mood and Affect: Mood normal.        Behavior: Behavior normal.     Palliative Assessment/Data: 40-50%   Existing Vynca/ACP Documentation: None  Assessment & Plan:   HPI/Patient Profile:  43 y.o. 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  She was admitted on 08/31/2024 with acute hypoxic respiratory failure, pulmonary edema, pneumonia, acute on chronic heart failure, mitral regurg, tricuspid regurg, aortic regurg, atrial fibrillation, ESRD, lupus nephritis, AGMA, hyperkalemia, severe protein calorie malnutrition, unintentional weight loss, physical deconditioning/generalized weakness, and others.    Palliative medicine was consulted for GOC conversations.  SUMMARY OF RECOMMENDATIONS   Full code Full scope of care Continues to be open to offered and available interventions for improvement including ongoing HD Spiritual care to assist in HCPOA completion after patient's mother arrives today Continue support of patient and family Goals are clear at this time the patient is high risk for decline Palliative medicine will back off but be available for acute changes or new needs  Symptom Management:  Per primary team PMT is available to assist as  needed  Code Status: Full Code  Prognosis: Unable to determine  Discharge Planning: To Be Determined  Discussed with: Patient, medical team, nursing team  Thank you for allowing us  to participate in the care of [name] PMT will continue to support holistically.  Billing based on MDM: Moderate  Detailed review of medical records (labs, imaging, vital signs), medically appropriate exam, discussed with treatment team, counseling and education to patient, family, & staff, documenting clinical information, medication management, coordination of care  [name] Kays, NP Palliative Medicine Team  Team Phone # [phone] (Nights/Weekends)  08/26/2021, 8:17 AM

## 2024-09-06 NOTE — Progress Notes (Signed)
 Patient has no c/o being short of breath at this time. No bipap needed, will continue to monitor patient

## 2024-09-06 NOTE — Progress Notes (Signed)
 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 Also had decreased appetite and fatigue. Denies fevers, chills. In ED, started on BiPAP. Able to transition to 6L [place]. CXR with pulmonary edema. K 5.3 BNP elevated. Normal WBC. LA 6.2>3.2. Given levaquin . SBP readings low-normal with intermittent readings with SBP in 60s and 70s but mentating well and eating a chicken wrap. PCCM and Nephrology consulted. Plan for hemodialysis in ICU.    While in ICU, patient had daily HD with ultrafiltration with vasopressor support from 9/4-9/6.  She was weaned off vasopressor on 9/7 and tolerated HD with ultrafiltration of 1.5 L on 9/8.  Transferred to hospitalist service on 9/9.  Bleeding from right AVF after venous needle was pulled on 9/8 requiring stitch, manual pressure and dressing.  VVS recommended HD cath if issues with fistula persist.   Assessment & Plan:  Active Problems:   Hemodialysis catheter malfunction (HCC)   Atrial flutter (HCC)   Lupus (systemic lupus erythematosus) (HCC)   Insomnia   Acute on chronic combined systolic and diastolic CHF (congestive heart failure) (HCC)   Fluid overload   Acute respiratory failure with hypoxia (HCC)   Cardiogenic shock (HCC)   Acute hypoxic respiratory failure due to pulmonary edema-improved.  Currently saturating at 100% on 3 L by [place]. Repeat CXR with pulmonary edema.  Pneumonia ruled out. -Patient had serial daily HD with ultrafiltration since admission. -Last HD was on 9/9 -Wean oxygen as able.  Goal O2 saturation > 88%   Acute on Chronic heart failure/biventricular heart failure: NYHA III. TTE with LVEF of 30 to  35%, severely reduced RVF, sev TR, mod-sev AR and moderate MR.  Repeat CXR this morning with  pulmonary edema.  Low BP 257/38 this morning likely erroneous entry. -Cardiology signed off. -Not a candidate for GDMT in the setting of hypotension and being hemodialysis dependent.   Mod mitral valve regurg Sev tricuspid valve regurg Mod-sev aortic valve regurg  -Deemed to be not candidate for interventional therapies due to cocaine  use and overall risk profile.  -Appreciate cardiology input -Volume removal via dialysis.    Long standing persistent Atrial fibrillation/flutter:  Not on AC due to history of SDH. -Continue amiodarone  400 mg twice daily for now.  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 -Nephrology following.   IDDM with level 1 hypoglycemia: A1c 6.1%. No acute issues     Severe protein calorie malnutrition Unintentional weight loss Failure to thrive - Palliative medicine following.  Body mass index is 13.9 Nutrition Problem: Severe Malnutrition Etiology: chronic illness (ESRD, HFrEF) Signs/Symptoms: severe fat depletion, severe muscle depletion Interventions: Magic cup, MVI, Nepro shake, Refer to RD note for recommendations   Generalized weakness/physical deconditioning: Reports independent ambulating at baseline. - PT/OT eval     DVT prophylaxis: heparin  injection 5,000 Units Start: 08/31/24 1400 SCDs Start: 08/31/24 1052     Code Status: Full Code Family Communication:  None at the bedside Status is: Inpatient Remains inpatient appropriate because: HD, low BP    Subjective:  She denied any active complaints to me. She had HD  yesterday and she thinks she will have HD today as well. We spoke about goals of care and she still wants to remain a full code. She lives at home with her mother.   Examination:  General exam: Appears calm and comfortable  Respiratory system:  Clear to auscultation. Respiratory effort normal. Cardiovascular system: S1 & S2 heard, RRR. No JVD, murmurs, rubs, gallops or clicks. No pedal edema. Gastrointestinal system: Abdomen is nondistended, soft and nontender. No organomegaly or masses felt. Normal bowel sounds heard. Central nervous system: Alert and oriented. No focal neurological deficits. Extremities: Symmetric 5 x 5 power. Skin: No rashes, lesions or ulcers Psychiatry: Judgement and insight appear normal. Mood & affect appropriate.  @ENCIMAGES @      Diet Orders (From admission, onward)     Start     Ordered   08/31/24 1136  Diet regular Room service appropriate? Yes; Fluid consistency: Thin; Fluid restriction: 1200 mL Fluid  Diet effective now       Question Answer Comment  Room service appropriate? Yes   Fluid consistency: Thin   Fluid restriction: 1200 mL Fluid      08/31/24 1135            Objective: Vitals:   09/05/24 1825 09/05/24 2022 09/05/24 2033 09/06/24 0426  BP:  (!) 149/56  (!) 111/59  Pulse: 74 81 83 88  Resp: (!) 25 19 20 20   Temp:  97.9 F (36.6 C)  98.3 F (36.8 C)  TempSrc:  Oral    SpO2: 96% (!) 69% 97% 99%  Weight:      Height:        Intake/Output Summary (Last 24 hours) at 09/06/2024 1249 Last data filed at 09/06/2024 0600 Gross per 24 hour  Intake 987.62 ml  Output 1000 ml  Net -12.38 ml   Filed Weights   09/05/24 0702 09/05/24 1401 09/05/24 1822  Weight: 36.3 kg 36 kg 34.5 kg    Scheduled Meds:  amiodarone   400 mg Oral BID   Chlorhexidine  Gluconate Cloth  6 each Topical Q0600   feeding supplement (NEPRO CARB STEADY)  237 mL Oral TID BM   heparin   5,000 Units Subcutaneous Q8H   midodrine   10 mg Oral TID WC   multivitamin  1 tablet Oral QHS   mouth rinse  15 mL Mouth Rinse 4 times per day   oxidized cellulose  1 each Topical Once   sertraline   25 mg Oral Daily   Continuous Infusions:  dextrose  30 mL/hr at 09/05/24 9495    Nutritional status Signs/Symptoms:  severe fat depletion, severe muscle depletion Interventions: Magic cup, MVI, Nepro shake, Refer to RD note for recommendations Body mass index is 13.91 kg/m.  Data Reviewed:   CBC: Recent Labs  Lab 08/31/24 0725 08/31/24 0753 09/01/24 0413 09/02/24 0817 09/03/24 0236 09/05/24 1410 09/06/24 0332  WBC 8.7  --  6.7 7.4 7.0 8.8 8.5  NEUTROABS 5.6  --   --   --   --   --   --   HGB 13.6   < > 12.0 12.3 13.1 11.7* 11.9*  HCT 43.1   < > 36.5 38.1 39.6 35.2* 36.0  MCV 88.0  --  84.3 85.2 83.4 83.8 84.3  PLT 191  --  164 147* 159 159 130*   < > = values in this interval not displayed.   Basic Metabolic Panel: Recent Labs  Lab 09/01/24 0413 09/02/24 0817 09/03/24 0236 09/05/24 1410 09/06/24 0332  NA 135 132* 132*  127* 132*  K 4.7 4.2 3.8 4.1 4.2  CL 92* 91* 92* 88* 93*  CO2 23 21* 24 24 25   GLUCOSE 81 113* 96 126* 91  BUN 39* 30* 24* 47* 30*  CREATININE 5.70* 4.34* 3.62* 5.04* 3.78*  CALCIUM  9.9 9.4 9.7 9.3 9.4  MG  --   --   --  2.3 2.2  PHOS 6.1*  --   --  5.5* 4.3   GFR: Estimated Creatinine Clearance: 10.8 mL/min (A) (by C-G formula based on SCr of 3.78 mg/dL (H)). Liver Function Tests: Recent Labs  Lab 09/05/24 1410 09/06/24 0332  ALBUMIN  2.3* 2.4*   No results for input(s): LIPASE, AMYLASE in the last 168 hours. No results for input(s): AMMONIA in the last 168 hours. Coagulation Profile: Recent Labs  Lab 09/01/24 1554  INR 1.8*   Cardiac Enzymes: No results for input(s): CKTOTAL, CKMB, CKMBINDEX, TROPONINI in the last 168 hours. BNP (last 3 results) No results for input(s): PROBNP in the last 8760 hours. HbA1C: No results for input(s): HGBA1C in the last 72 hours. CBG: Recent Labs  Lab 09/05/24 1144 09/05/24 2019 09/06/24 0217 09/06/24 0424 09/06/24 1136  GLUCAP 90 107* 106* 100* 162*   Lipid Profile: No results for input(s): CHOL, HDL, LDLCALC, TRIG, CHOLHDL, LDLDIRECT in the last 72 hours. Thyroid Function  Tests: No results for input(s): TSH, T4TOTAL, FREET4, T3FREE, THYROIDAB in the last 72 hours. Anemia Panel: No results for input(s): VITAMINB12, FOLATE, FERRITIN, TIBC, IRON , RETICCTPCT in the last 72 hours. Sepsis Labs: Recent Labs  Lab 08/31/24 2339 09/02/24 0817 09/04/24 0220 09/06/24 0332  LATICACIDVEN 4.0* 3.9* 3.2* 2.7*    Recent Results (from the past 240 hours)  Resp panel by RT-PCR (RSV, Flu A&B, Covid) Anterior Nasal Swab     Status: None   Collection Time: 08/31/24  8:54 AM   Specimen: Anterior Nasal Swab  Result Value Ref Range Status   SARS Coronavirus 2 by RT PCR NEGATIVE NEGATIVE Final   Influenza A by PCR NEGATIVE NEGATIVE Final   Influenza B by PCR NEGATIVE NEGATIVE Final    Comment: (NOTE) The Xpert Xpress SARS-CoV-2/FLU/RSV plus assay is intended as an aid in the diagnosis of influenza from Nasopharyngeal swab specimens and should not be used as a sole basis for treatment. Nasal washings and aspirates are unacceptable for Xpert Xpress SARS-CoV-2/FLU/RSV testing.  Fact Sheet for Patients: BloggerCourse.com  Fact Sheet for Healthcare Providers: SeriousBroker.it  This test is not yet approved or cleared by the [place]  FDA and has been authorized for detection and/or diagnosis of SARS-CoV-2 by FDA under an Emergency Use Authorization (EUA). This EUA will remain in effect (meaning this test can be used) for the duration of the COVID-19 declaration under Section 564(b)(1) of the Act, 21 U.S.C. section 360bbb-3(b)(1), unless the authorization is terminated or revoked.     Resp Syncytial Virus by PCR NEGATIVE NEGATIVE Final    Comment: (NOTE) Fact Sheet for Patients: BloggerCourse.com  Fact Sheet for Healthcare Providers: SeriousBroker.it  This test is not yet approved or cleared by the [place]  FDA and has been authorized  for detection and/or diagnosis of SARS-CoV-2 by FDA under an Emergency Use Authorization (EUA). This EUA will remain in effect (meaning this test can be used) for the duration of the COVID-19 declaration under Section 564(b)(1) of the Act, 21 U.S.C. section 360bbb-3(b)(1), unless the authorization is terminated or revoked.  Performed at [facility], [address] [place], [place] [address]  MRSA Next Gen by PCR, Nasal     Status: Abnormal   Collection Time: 08/31/24 12:04 PM   Specimen: Nasal Mucosa; Nasal Swab  Result Value Ref Range Status   MRSA by PCR Next Gen DETECTED (A) NOT DETECTED Final    Comment: RESULT CALLED TO, READ BACK BY AND VERIFIED WITH: RN TERESA CRITE ON 08/31/24 @ 1420 BY DRT (NOTE) The GeneXpert MRSA Assay (FDA approved for NASAL specimens only), is one component of a comprehensive MRSA colonization surveillance program. It is not intended to diagnose MRSA infection nor to guide or monitor treatment for MRSA infections. Test performance is not FDA approved in patients less than 84 years old. Performed at [facility], [address], [place], [place] [address]   Culture, blood (Routine X 2) w Reflex to ID Panel     Status: None   Collection Time: 08/31/24 12:23 PM   Specimen: BLOOD  Result Value Ref Range Status   Specimen Description BLOOD SITE NOT SPECIFIED  Final   Special Requests   Final    BOTTLES DRAWN AEROBIC ONLY Blood Culture results may not be optimal due to an inadequate volume of blood received in culture bottles   Culture   Final    NO GROWTH 5 DAYS Performed at [facility], [address], [place], [place] [address]    Report Status 09/05/2024 FINAL  Final  Culture, blood (Routine X 2) w Reflex to ID Panel     Status: None   Collection Time: 08/31/24 12:23 PM   Specimen: BLOOD  Result Value Ref Range Status   Specimen Description BLOOD SITE NOT SPECIFIED  Final   Special Requests   Final    BOTTLES DRAWN AEROBIC  ONLY Blood Culture adequate volume   Culture   Final    NO GROWTH 5 DAYS Performed at [facility], [address] [place], [place] [address]    Report Status 09/05/2024 FINAL  Final  Respiratory (~20 pathogens) panel by PCR     Status: None   Collection Time: 08/31/24  2:01 PM   Specimen: Nasopharyngeal Swab; Respiratory  Result Value Ref Range Status   Adenovirus NOT DETECTED NOT DETECTED Final   Coronavirus 229E NOT DETECTED NOT DETECTED Final    Comment: (NOTE) The Coronavirus on the Respiratory Panel, DOES NOT test for the novel  Coronavirus (2019 nCoV)    Coronavirus HKU1 NOT DETECTED NOT DETECTED Final   Coronavirus NL63 NOT DETECTED NOT DETECTED Final   Coronavirus OC43 NOT DETECTED NOT DETECTED Final   Metapneumovirus NOT DETECTED NOT DETECTED Final   Rhinovirus / Enterovirus NOT DETECTED NOT DETECTED Final   Influenza A NOT DETECTED NOT DETECTED Final   Influenza B NOT DETECTED NOT DETECTED Final   Parainfluenza Virus 1 NOT DETECTED NOT DETECTED Final   Parainfluenza Virus 2 NOT DETECTED NOT DETECTED Final   Parainfluenza Virus 3 NOT DETECTED NOT DETECTED Final   Parainfluenza Virus 4 NOT DETECTED NOT DETECTED Final   Respiratory Syncytial Virus NOT DETECTED NOT DETECTED Final   Bordetella pertussis NOT DETECTED NOT DETECTED Final   Bordetella Parapertussis NOT DETECTED NOT DETECTED Final   Chlamydophila pneumoniae NOT DETECTED NOT DETECTED Final   Mycoplasma pneumoniae NOT DETECTED NOT DETECTED Final    Comment: Performed at [facility], [address], [place], [place] [address]         Radiology Studies: DG CHEST PORT 1 VIEW Result Date: 09/05/2024 CLINICAL DATA:  Shortness  of breath. EXAM: PORTABLE CHEST 1 VIEW COMPARISON:  September 03, 2024 FINDINGS: The cardiac silhouette is enlarged and unchanged in size. Marked severity calcification of the aortic arch is seen. There is stable mild to moderate severity diffuse interstitial and airspace  opacities. There is a small left pleural effusion. No pneumothorax is identified. The visualized skeletal structures are unremarkable. IMPRESSION: Stable cardiomegaly with findings likely consistent with stable mild to moderate severity pulmonary edema. Electronically Signed   By: Suzen Dials M.D.   On: 09/05/2024 10:37           LOS: 6 days   Time spent= 35 mins    Deliliah Room, MD [facility]  If 7PM-7AM, please contact night-coverage  09/06/2024, 12:49 PM

## 2024-09-06 NOTE — Progress Notes (Signed)
 Physical Therapy Treatment Patient Details Name: [name] MRN: [id number] DOB: August 26, 1981 Today's Date: 09/06/2024   History of Present Illness Pt is a 43 y.o. female admitted 9/4 for acute respiratory failure with hypoxia. 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 Her home requires negotiating a flight of stairs to get in/out. Currently unable to manage that mobility. Patient will benefit from continued inpatient follow up therapy, <3 hours/day.    If plan is discharge home, recommend the following: A little help with walking and/or transfers;A little help with bathing/dressing/bathroom;Assistance with cooking/housework;Help with stairs or ramp for entrance;Assist for transportation   Can travel by private Psychologist, clinical (4 wheels)    Recommendations for Other Services       Precautions / Restrictions Precautions Precautions: Fall Recall of Precautions/Restrictions: Intact Restrictions Weight Bearing Restrictions Per Provider Order: No     Mobility  Bed Mobility Overal bed mobility: Needs Assistance Bed Mobility: Supine to Sit, Sit to Supine     Supine to sit: Min assist Sit to supine: Contact guard assist   General bed mobility comments: Assist to elevate trunk into sitting    Transfers Overall transfer level: Needs assistance Equipment used: Rollator (4 wheels) Transfers: Sit to/from Stand Sit to Stand: Min assist                Ambulation/Gait Ambulation/Gait assistance: Min assist Gait Distance (Feet): 3 Feet (x 2) Assistive device: Rollator (4 wheels) Gait Pattern/deviations: Step-to pattern, Decreased step length - right, Decreased step length - left, Shuffle, Narrow base of support Gait  velocity: decr Gait velocity interpretation: <1.31 ft/sec, indicative of household ambulator   General Gait Details: Assist for balance and support   Stairs             Wheelchair Mobility     Tilt Bed    Modified Rankin (Stroke Patients Only)       Balance Overall balance assessment: Needs assistance Sitting-balance support: No upper extremity supported, Feet supported Sitting balance-Leahy Scale: Good     Standing balance support: Bilateral upper extremity supported, During functional activity, Reliant on assistive device for balance Standing balance-Leahy Scale: Poor Standing balance comment: UE support                            Communication Communication Communication: No apparent difficulties  Cognition Arousal: Alert Behavior During Therapy: WFL for tasks assessed/performed   PT - Cognitive impairments: No apparent impairments                         Following commands: Intact      Cueing Cueing Techniques: Verbal cues  Exercises      General Comments General comments (skin integrity, edema, etc.): Pt on RA      Pertinent Vitals/Pain Pain Assessment Pain Assessment: No/denies pain    Home Living                          Prior Function            PT Goals (current goals can now be found in the care  plan section) Acute Rehab PT Goals Patient Stated Goal: get stronger Progress towards PT goals: Progressing toward goals    Frequency    Min 2X/week      PT Plan      Co-evaluation              AM-PAC PT 6 Clicks Mobility   Outcome Measure  Help needed turning from your back to your side while in a flat bed without using bedrails?: None Help needed moving from lying on your back to sitting on the side of a flat bed without using bedrails?: A Little Help needed moving to and from a bed to a chair (including a wheelchair)?: A Little Help needed standing up from a chair using your arms (e.g.,  wheelchair or bedside chair)?: A Little Help needed to walk in hospital room?: Total Help needed climbing 3-5 steps with a railing? : Total 6 Click Score: 15    End of Session Equipment Utilized During Treatment: Gait belt Activity Tolerance: Patient limited by fatigue Patient left: in bed;with call bell/phone within reach;with bed alarm set Nurse Communication: Mobility status PT Visit Diagnosis: Muscle weakness (generalized) (M62.81);Other abnormalities of gait and mobility (R26.89);Difficulty in walking, not elsewhere classified (R26.2)     Time: 8594-8574 PT Time Calculation (min) (ACUTE ONLY): 20 min  Charges:    $Gait Training: 8-22 mins PT General Charges $$ ACUTE PT VISIT: 1 Visit                     [facility] PT Acute Rehabilitation Services Office [phone]    Rodgers ORN [facility] 09/06/2024, 3:27 PM

## 2024-09-07 DIAGNOSIS — T8241XA Breakdown (mechanical) of vascular dialysis catheter, initial encounter: Secondary | ICD-10-CM | POA: Diagnosis not present

## 2024-09-07 DIAGNOSIS — Z515 Encounter for palliative care: Secondary | ICD-10-CM

## 2024-09-07 LAB — GLUCOSE, CAPILLARY
Glucose-Capillary: 124 mg/dL — ABNORMAL HIGH (ref 70–99)
Glucose-Capillary: 127 mg/dL — ABNORMAL HIGH (ref 70–99)
Glucose-Capillary: 129 mg/dL — ABNORMAL HIGH (ref 70–99)
Glucose-Capillary: 67 mg/dL — ABNORMAL LOW (ref 70–99)
Glucose-Capillary: 89 mg/dL (ref 70–99)

## 2024-09-07 MED ORDER — ALTEPLASE 2 MG IJ SOLR
2.0000 mg | Freq: Once | INTRAMUSCULAR | Status: DC | PRN
  Filled 2024-09-07: qty 2

## 2024-09-07 MED ORDER — DIPHENHYDRAMINE HCL 50 MG/ML IJ SOLN
INTRAMUSCULAR | Status: AC
  Filled 2024-09-07: qty 1

## 2024-09-07 MED ORDER — MIDODRINE HCL 5 MG PO TABS
10.0000 mg | ORAL_TABLET | Freq: Every day | ORAL | Status: DC | PRN
  Administered 2024-09-07 – 2024-09-09 (×4): 10 mg via ORAL

## 2024-09-07 MED ORDER — MIDODRINE HCL 5 MG PO TABS
ORAL_TABLET | ORAL | Status: AC
  Filled 2024-09-07: qty 1

## 2024-09-07 MED ORDER — DIPHENHYDRAMINE HCL 50 MG/ML IJ SOLN
25.0000 mg | Freq: Once | INTRAMUSCULAR | Status: DC
Start: 2024-09-07 — End: 2024-09-11

## 2024-09-07 MED ORDER — PENTAFLUOROPROP-TETRAFLUOROETH EX AERO: 1.0000 | INHALATION_SPRAY | CUTANEOUS | Status: DC | PRN

## 2024-09-07 MED ORDER — LIDOCAINE HCL (PF) 1 % IJ SOLN
5.0000 mL | INTRAMUSCULAR | Status: DC | PRN
  Filled 2024-09-07: qty 5

## 2024-09-07 MED ORDER — ACETAMINOPHEN 325 MG PO TABS
ORAL_TABLET | ORAL | Status: AC
  Filled 2024-09-07: qty 2

## 2024-09-07 MED ORDER — LIDOCAINE-PRILOCAINE 2.5-2.5 % EX CREA
1.0000 | TOPICAL_CREAM | CUTANEOUS | Status: DC | PRN
  Filled 2024-09-07: qty 5

## 2024-09-07 MED ORDER — HEPARIN SODIUM (PORCINE) 1000 UNIT/ML DIALYSIS
1000.0000 [IU] | INTRAMUSCULAR | Status: DC | PRN
  Filled 2024-09-07: qty 1

## 2024-09-07 NOTE — Plan of Care (Signed)
     Referral previously received for Rosina [name] for goals of care discussion. Noted most recent palliative in-person assessment dated 09/06/2024 at which time it was noted that patient is quite clear on desire for full code and full scope of care.  Chart reviewed for Recent provider notes, nurse notes, vitals, and labs and updates received from RN.   At this time patient appears stable, although long-term prognosis remains quite poor with limited options should she further deteriorate.  However, from my interactions with the patient she is very clear on desire for full code and full scope noting I am not going to hospice.  Family also is on board with this plan.  Discussed some concerns with nephrology NP today, agreement that she is high risk for deterioration.    No plan for in person follow-up at this time. Please contact the palliative medicine provider on service for any new/urgent needs that require our assistance with this patient.  Thank you for your referral and allowing PMT to assist in Analaura Messler Scinto's care.   [name], NP Palliative Medicine Team Phone: [phone]  NO CHARGE

## 2024-09-07 NOTE — Progress Notes (Signed)
 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 Also had decreased appetite and fatigue. Denies fevers, chills. In ED, started on BiPAP. Able to transition to 6L [place]. CXR with pulmonary edema. K 5.3 BNP elevated. Normal WBC. LA 6.2>3.2. Given levaquin . SBP readings low-normal with intermittent readings with SBP in 60s and 70s but mentating well and eating a chicken wrap. PCCM and Nephrology consulted. Plan for hemodialysis in ICU.    While in ICU, patient had daily HD with ultrafiltration with vasopressor support from 9/4-9/6.  She was weaned off vasopressor on 9/7 and tolerated HD with ultrafiltration of 1.5 L on 9/8.  Transferred to hospitalist service on 9/9.  Bleeding from right AVF after venous needle was pulled on 9/8 requiring stitch, manual pressure and dressing.  VVS recommended HD cath if issues with fistula persist. Pending placement to SNF.   Assessment & Plan:  Active Problems:   Hemodialysis catheter malfunction (HCC)   Atrial flutter (HCC)   Lupus (systemic lupus erythematosus) (HCC)   Insomnia   Acute on chronic combined systolic and diastolic CHF (congestive heart failure) (HCC)   Fluid overload   Acute respiratory failure with hypoxia (HCC)   Cardiogenic shock (HCC)   Acute hypoxic respiratory failure due to pulmonary edema-improved.  Currently saturating at 100% on 3 L by [place]. Repeat CXR with pulmonary edema.  Pneumonia ruled out. -Patient had serial daily HD with ultrafiltration since admission. -Last HD was on 9/9 -Wean oxygen as able.  Goal O2 saturation > 88%   Acute on Chronic heart failure/biventricular heart failure: NYHA III.  TTE with LVEF of 30 to 35%, severely reduced RVF, sev TR, mod-sev AR and moderate MR.  Repeat CXR this morning with  pulmonary edema.  -Cardiology signed off. -Not a candidate for GDMT in the setting of hypotension and being hemodialysis dependent.   Mod mitral valve regurg Sev tricuspid valve regurg Mod-sev aortic valve regurg  -Deemed to be not candidate for interventional therapies due to cocaine  use and overall risk profile.  -Appreciate cardiology input -Volume removal via dialysis.    Long standing persistent Atrial fibrillation/flutter:  Not on AC due to history of SDH. -Continue amiodarone  400 mg twice daily for now.  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 -Nephrology following.   IDDM with level 1 hypoglycemia: A1c 6.1%. No acute issues     Severe protein calorie malnutrition Unintentional weight loss Failure to thrive - Palliative medicine following.  Body mass index is 13.9 Nutrition Problem: Severe Malnutrition Etiology: chronic illness (ESRD, HFrEF) Signs/Symptoms: severe fat depletion, severe muscle depletion Interventions: Magic cup, MVI, Nepro shake, Refer to RD note for recommendations   Generalized weakness/physical deconditioning: Reports independent ambulating at baseline. - PT/OT eval-recommendation is SNF     DVT prophylaxis: heparin  injection 5,000 Units Start: 08/31/24 1400 SCDs Start: 08/31/24 1052     Code Status: Full Code Family Communication:  None at the bedside Status is: Inpatient Remains inpatient appropriate because: HD, low BP, pending SNF    Subjective:  No acute events overnight. HD to be done today.  Examination:  General exam: Appears frail, chronically ill looking Respiratory system: Clear to auscultation. Respiratory effort normal. Cardiovascular system: S1 & S2 heard, RRR. No JVD, murmurs, rubs, gallops or clicks. No pedal  edema. Gastrointestinal system: Abdomen is nondistended, soft and nontender. No organomegaly or masses felt. Normal bowel sounds heard. Central nervous system: Alert and oriented. No focal neurological deficits. Extremities: Symmetric 5 x 5 power. Rt arm AVF in place Skin: No rashes, lesions or ulcers       Diet Orders (From admission, onward)     Start     Ordered   08/31/24 1136  Diet regular [name] service appropriate? Yes; Fluid consistency: Thin; Fluid restriction: 1200 mL Fluid  Diet effective now       Question Answer Comment  [name] service appropriate? Yes   Fluid consistency: Thin   Fluid restriction: 1200 mL Fluid      08/31/24 1135            Objective: Vitals:   09/07/24 1000 09/07/24 1030 09/07/24 1100 09/07/24 1142  BP: (!) 131/42 (!) 123/41 (!) 106/49 107/83  Pulse: 85 85 88 93  Resp: (!) 23 (!) 24 (!) 26 16  Temp:      TempSrc:      SpO2:  98% 97% 94%  Weight:      Height:       No intake or output data in the 24 hours ending 09/07/24 1232  Filed Weights   09/05/24 1822 09/07/24 0451 09/07/24 0857  Weight: 34.5 kg 37.7 kg 36.8 kg    Scheduled Meds:  amiodarone   400 mg Oral BID   Chlorhexidine  Gluconate Cloth  6 each Topical Q0600   diphenhydrAMINE   25 mg Intravenous Once   feeding supplement (NEPRO CARB STEADY)  237 mL Oral TID BM   heparin   5,000 Units Subcutaneous Q8H   midodrine   10 mg Oral TID WC   multivitamin  1 tablet Oral QHS   mouth rinse  15 mL Mouth Rinse 4 times per day   oxidized cellulose  1 each Topical Once   sertraline   25 mg Oral Daily   Continuous Infusions:  dextrose  50 mL/hr at 09/07/24 0709    Nutritional status Signs/Symptoms: severe fat depletion, severe muscle depletion Interventions: Magic cup, MVI, Nepro shake, Refer to RD note for recommendations Body mass index is 14.84 kg/m.  Data Reviewed:   CBC: Recent Labs  Lab 09/01/24 0413 09/02/24 0817 09/03/24 0236 09/05/24 1410 09/06/24 0332  WBC 6.7 7.4  7.0 8.8 8.5  HGB 12.0 12.3 13.1 11.7* 11.9*  HCT 36.5 38.1 39.6 35.2* 36.0  MCV 84.3 85.2 83.4 83.8 84.3  PLT 164 147* 159 159 130*   Basic Metabolic Panel: Recent Labs  Lab 09/01/24 0413 09/02/24 0817 09/03/24 0236 09/05/24 1410 09/06/24 0332  NA 135 132* 132* 127* 132*  K 4.7 4.2 3.8 4.1 4.2  CL 92* 91* 92* 88* 93*  CO2 23 21* 24 24 25   GLUCOSE 81 113* 96 126* 91  BUN 39* 30* 24* 47* 30*  CREATININE 5.70* 4.34* 3.62* 5.04* 3.78*  CALCIUM  9.9 9.4 9.7 9.3 9.4  MG  --   --   --  2.3 2.2  PHOS 6.1*  --   --  5.5* 4.3   GFR: Estimated Creatinine Clearance: 11.5 mL/min (A) (by C-G formula based on SCr of 3.78 mg/dL (H)). Liver Function Tests: Recent Labs  Lab 09/05/24 1410 09/06/24 0332  ALBUMIN  2.3* 2.4*   No results for input(s):  LIPASE, AMYLASE in the last 168 hours. No results for input(s): AMMONIA in the last 168 hours. Coagulation Profile: Recent Labs  Lab 09/01/24 1554  INR 1.8*   Cardiac Enzymes: No results for input(s): CKTOTAL, CKMB, CKMBINDEX, TROPONINI in the last 168 hours. BNP (last 3 results) No results for input(s): PROBNP in the last 8760 hours. HbA1C: No results for input(s): HGBA1C in the last 72 hours. CBG: Recent Labs  Lab 09/06/24 1813 09/06/24 1856 09/06/24 2137 09/07/24 0006 09/07/24 0611  GLUCAP 64* 69* 107* 124* 129*   Lipid Profile: No results for input(s): CHOL, HDL, LDLCALC, TRIG, CHOLHDL, LDLDIRECT in the last 72 hours. Thyroid Function Tests: No results for input(s): TSH, T4TOTAL, FREET4, T3FREE, THYROIDAB in the last 72 hours. Anemia Panel: No results for input(s): VITAMINB12, FOLATE, FERRITIN, TIBC, IRON , RETICCTPCT in the last 72 hours. Sepsis Labs: Recent Labs  Lab 08/31/24 2339 09/02/24 0817 09/04/24 0220 09/06/24 0332  LATICACIDVEN 4.0* 3.9* 3.2* 2.7*    Recent Results (from the past 240 hours)  Resp panel by RT-PCR (RSV, Flu A&B, Covid) Anterior Nasal Swab      Status: None   Collection Time: 08/31/24  8:54 AM   Specimen: Anterior Nasal Swab  Result Value Ref Range Status   SARS Coronavirus 2 by RT PCR NEGATIVE NEGATIVE Final   Influenza A by PCR NEGATIVE NEGATIVE Final   Influenza B by PCR NEGATIVE NEGATIVE Final    Comment: (NOTE) The Xpert Xpress SARS-CoV-2/FLU/RSV plus assay is intended as an aid in the diagnosis of influenza from Nasopharyngeal swab specimens and should not be used as a sole basis for treatment. Nasal washings and aspirates are unacceptable for Xpert Xpress SARS-CoV-2/FLU/RSV testing.  Fact Sheet for Patients: BloggerCourse.com  Fact Sheet for Healthcare Providers: SeriousBroker.it  This test is not yet approved or cleared by the [place]  FDA and has been authorized for detection and/or diagnosis of SARS-CoV-2 by FDA under an Emergency Use Authorization (EUA). This EUA will remain in effect (meaning this test can be used) for the duration of the COVID-19 declaration under Section 564(b)(1) of the Act, 21 U.S.C. section 360bbb-3(b)(1), unless the authorization is terminated or revoked.     Resp Syncytial Virus by PCR NEGATIVE NEGATIVE Final    Comment: (NOTE) Fact Sheet for Patients: BloggerCourse.com  Fact Sheet for Healthcare Providers: SeriousBroker.it  This test is not yet approved or cleared by the [place]  FDA and has been authorized for detection and/or diagnosis of SARS-CoV-2 by FDA under an Emergency Use Authorization (EUA). This EUA will remain in effect (meaning this test can be used) for the duration of the COVID-19 declaration under Section 564(b)(1) of the Act, 21 U.S.C. section 360bbb-3(b)(1), unless the authorization is terminated or revoked.  Performed at [facility], [address] [place], [place] [address]   MRSA Next Gen by PCR, Nasal     Status: Abnormal   Collection  Time: 08/31/24 12:04 PM   Specimen: Nasal Mucosa; Nasal Swab  Result Value Ref Range Status   MRSA by PCR Next Gen DETECTED (A) NOT DETECTED Final    Comment: RESULT CALLED TO, READ BACK BY AND VERIFIED WITH: RN TERESA CRITE ON 08/31/24 @ 1420 BY DRT (NOTE) The GeneXpert MRSA Assay (FDA approved for NASAL specimens only), is one component of a comprehensive MRSA colonization surveillance program. It is not intended to diagnose MRSA infection nor to guide or monitor treatment for MRSA infections. Test performance is not FDA approved in patients less than 2  years old. Performed at [facility], [address] [place], [place] [address]   Culture, blood (Routine X 2) w Reflex to ID Panel     Status: None   Collection Time: 08/31/24 12:23 PM   Specimen: BLOOD  Result Value Ref Range Status   Specimen Description BLOOD SITE NOT SPECIFIED  Final   Special Requests   Final    BOTTLES DRAWN AEROBIC ONLY Blood Culture results may not be optimal due to an inadequate volume of blood received in culture bottles   Culture   Final    NO GROWTH 5 DAYS Performed at [facility], [address] [place], [place] [address]    Report Status 09/05/2024 FINAL  Final  Culture, blood (Routine X 2) w Reflex to ID Panel     Status: None   Collection Time: 08/31/24 12:23 PM   Specimen: BLOOD  Result Value Ref Range Status   Specimen Description BLOOD SITE NOT SPECIFIED  Final   Special Requests   Final    BOTTLES DRAWN AEROBIC ONLY Blood Culture adequate volume   Culture   Final    NO GROWTH 5 DAYS Performed at [facility], [address], [place], [place] [address]    Report Status 09/05/2024 FINAL  Final  Respiratory (~20 pathogens) panel by PCR     Status: None   Collection Time: 08/31/24  2:01 PM   Specimen: Nasopharyngeal Swab; Respiratory  Result Value Ref Range Status   Adenovirus NOT DETECTED NOT DETECTED Final   Coronavirus 229E NOT DETECTED NOT DETECTED Final     Comment: (NOTE) The Coronavirus on the Respiratory Panel, DOES NOT test for the novel  Coronavirus (2019 nCoV)    Coronavirus HKU1 NOT DETECTED NOT DETECTED Final   Coronavirus NL63 NOT DETECTED NOT DETECTED Final   Coronavirus OC43 NOT DETECTED NOT DETECTED Final   Metapneumovirus NOT DETECTED NOT DETECTED Final   Rhinovirus / Enterovirus NOT DETECTED NOT DETECTED Final   Influenza A NOT DETECTED NOT DETECTED Final   Influenza B NOT DETECTED NOT DETECTED Final   Parainfluenza Virus 1 NOT DETECTED NOT DETECTED Final   Parainfluenza Virus 2 NOT DETECTED NOT DETECTED Final   Parainfluenza Virus 3 NOT DETECTED NOT DETECTED Final   Parainfluenza Virus 4 NOT DETECTED NOT DETECTED Final   Respiratory Syncytial Virus NOT DETECTED NOT DETECTED Final   Bordetella pertussis NOT DETECTED NOT DETECTED Final   Bordetella Parapertussis NOT DETECTED NOT DETECTED Final   Chlamydophila pneumoniae NOT DETECTED NOT DETECTED Final   Mycoplasma pneumoniae NOT DETECTED NOT DETECTED Final    Comment: Performed at [facility], [address] [place], [place] [address]         Radiology Studies: No results found.      LOS: 7 days   Time spent= 40 mins    [name] Room, MD [facility]  If 7PM-7AM, please contact night-coverage  09/07/2024, 12:32 PM

## 2024-09-07 NOTE — Plan of Care (Signed)
   Problem: Health Behavior/Discharge Planning: Goal: Ability to manage health-related needs will improve Outcome: Completed/Met

## 2024-09-07 NOTE — Progress Notes (Signed)
 [facility] Progress Note   Subjective:    Seen and examined patient at bedside in dialysis prior to starting tx.  No new issues.  Says her appetite is improving, has a nepro in bed w her.  Objective Vitals:   09/07/24 0449 09/07/24 0451 09/07/24 0857 09/07/24 0948  BP:  123/67 (!) 91/43 115/69  Pulse:  89 79 84  Resp: 17 19 (!) 23 17  Temp:  97.9 F (36.6 C) 97.6 F (36.4 C)   TempSrc:  Oral Oral   SpO2:  99% 100% 96%  Weight:  37.7 kg 36.8 kg   Height:       Physical Exam General: Awake, alert, NAD, cachectic, ill-appearing, on RA Heart: RRR; No MRGs Lungs: normal WOB, more clear posteriorly Abdomen: Soft and non-tender Extremities: No LE edema Dialysis Access: AVF +t/b   Filed Weights   09/05/24 1822 09/07/24 0451 09/07/24 0857  Weight: 34.5 kg 37.7 kg 36.8 kg   No intake or output data in the 24 hours ending 09/07/24 1058   Additional Objective Labs: Basic Metabolic Panel: Recent Labs  Lab 09/01/24 0413 09/02/24 0817 09/03/24 0236 09/05/24 1410 09/06/24 0332  NA 135   < > 132* 127* 132*  K 4.7   < > 3.8 4.1 4.2  CL 92*   < > 92* 88* 93*  CO2 23   < > 24 24 25   GLUCOSE 81   < > 96 126* 91  BUN 39*   < > 24* 47* 30*  CREATININE 5.70*   < > 3.62* 5.04* 3.78*  CALCIUM  9.9   < > 9.7 9.3 9.4  PHOS 6.1*  --   --  5.5* 4.3   < > = values in this interval not displayed.   Liver Function Tests: Recent Labs  Lab 09/05/24 1410 09/06/24 0332  ALBUMIN  2.3* 2.4*   No results for input(s): LIPASE, AMYLASE in the last 168 hours. CBC: Recent Labs  Lab 09/01/24 0413 09/02/24 0817 09/03/24 0236 09/05/24 1410 09/06/24 0332  WBC 6.7 7.4 7.0 8.8 8.5  HGB 12.0 12.3 13.1 11.7* 11.9*  HCT 36.5 38.1 39.6 35.2* 36.0  MCV 84.3 85.2 83.4 83.8 84.3  PLT 164 147* 159 159 130*   Blood Culture    Component Value Date/Time   SDES BLOOD SITE NOT SPECIFIED 08/31/2024 1223   SDES BLOOD SITE NOT SPECIFIED 08/31/2024 1223   SPECREQUEST  08/31/2024  1223    BOTTLES DRAWN AEROBIC ONLY Blood Culture results may not be optimal due to an inadequate volume of blood received in culture bottles   SPECREQUEST  08/31/2024 1223    BOTTLES DRAWN AEROBIC ONLY Blood Culture adequate volume   CULT  08/31/2024 1223    NO GROWTH 5 DAYS Performed at [facility], [address], [place], [place] [address]    CULT  08/31/2024 1223    NO GROWTH 5 DAYS Performed at [facility], [address] [place], [place] [address]    REPTSTATUS 09/05/2024 FINAL 08/31/2024 1223   REPTSTATUS 09/05/2024 FINAL 08/31/2024 1223    Cardiac Enzymes: No results for input(s): CKTOTAL, CKMB, CKMBINDEX, TROPONINI in the last 168 hours. CBG: Recent Labs  Lab 09/06/24 1813 09/06/24 1856 09/06/24 2137 09/07/24 0006 09/07/24 0611  GLUCAP 64* 69* 107* 124* 129*   Iron  Studies: No results for input(s): IRON , TIBC, TRANSFERRIN, FERRITIN in the last 72 hours. Lab Results  Component Value Date   INR 1.8 (H) 09/01/2024   INR  1.4 (H) 12/16/2023   INR 1.5 (H) 12/13/2023   Studies/Results: No results found.   Medications:  dextrose  50 mL/hr at 09/07/24 0709    amiodarone   400 mg Oral BID   Chlorhexidine  Gluconate Cloth  6 each Topical Q0600   diphenhydrAMINE   25 mg Intravenous Once   feeding supplement (NEPRO CARB STEADY)  237 mL Oral TID BM   heparin   5,000 Units Subcutaneous Q8H   midodrine   10 mg Oral TID WC   multivitamin  1 tablet Oral QHS   mouth rinse  15 mL Mouth Rinse 4 times per day   oxidized cellulose  1 each Topical Once   sertraline   25 mg Oral Daily    Dialysis Orders: 3h  B400   37.5kg   2K bath  AVF  Heparin  none Post 9/02 wt 38.2kg, usual comes off 0-1.5kg over Hb 12, no esa   Home bp meds: Midodrine  10 tid  Assessment/Plan: SOB/ pulm edema: per CXR. Not grossly overloaded, O2 appears to be slowly weaning down and now on RA in her room this morning, not in distress. Had HD in ICU Friday and Saturday  last week, low UF each session d/t low bp's. Pressors are off and remains on Midodrine  TID. Using IV Albumin  for additional BP support.  ESRD: on HD TTS. Back on schedule. Next HD today. See below for access plan Vascular Access: Noted AVF started bleeding on Monday after venous needle was pulled. VVS on board. A stitch, manual pressure, and dressing were applied. No issues last tx but if problems were to re-occur she will need TDC placement at that point. Hypotension: Cardiac in origin.  midodrine  continuing here,pre and mid doses Volume: not grossly vol overloaded, UF with HD to maintain euvolemia, po intake hasn't been very good. 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   Pall care following and well known to patient - cont full scope.  Manuelita Barters MD [facility] Pager [phone]

## 2024-09-07 NOTE — Progress Notes (Signed)
  Received patient in bed to unit.   Informed consent signed and in chart.    TX duration: 3.5     Transported by  Hand-off given to patient's nurse.    Access used: left AVF Access issues: none 2 stiches still intact   Total UF removed: 1100 Medication(s) given: midodrine  and tylenol   Post HD VS: 100/64 Post HD weight: 73.7     Latia Mataya Renie LPN [facility]

## 2024-09-07 NOTE — Progress Notes (Addendum)
 Nutrition Follow-up  DOCUMENTATION CODES:   Underweight, Severe malnutrition in context of chronic illness  INTERVENTION:  -Continue Nepro Shake po TID between meals, each supplement provides 425 kcal and 19 grams protein, pt prefers vanilla flavor   - Continue Magic Cup TID with meals, each supplement provides 290 kcal and 9 grams of protein   - Continue Renal MVI daily   - Continue regular diet to maximize meal choices  - Assistance with meal ordering to tailor meal preferences  - If within GOC, recommend long-term nutrition support to meet needs    NUTRITION DIAGNOSIS:  Severe Malnutrition related to chronic illness (ESRD, HFrEF) as evidenced by severe fat depletion, severe muscle depletion.  GOAL:  Patient will meet greater than or equal to 90% of their needs  MONITOR:  PO intake, Supplement acceptance, Labs, Weight trends, I & O's  REASON FOR ASSESSMENT:  Consult Assessment of nutrition requirement/status  ASSESSMENT:   43 year old female who presented to the ED on 9/04 with progressive SOB x 1 week despite compliance with HD sessions. 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 Pt admitted with acute hypoxemic respiratory failure secondary to pulmonary edema, acute on chronic heart failure.  Pt unavailable today at time of assessment as she was in dialysis. Has been conversing with palliative as she is high risk for deterioration and also running out of permanent dialysis access sites. Remains full scope. Pending SNF placement.   Average Meal Intake 9/7: 25% x2 documented meals  Per nephrology note, patient reports intake is improving. Documentation shows inadequate intake at 25%. Nepro likely augmenting intake. Continues to accept and consume Nepro,  [employer], and liberalized diet w/ fluid restriction.   If within GOC, would recommend long-term /permanent enteral nutrition support. It is likely that this will not significantly improve her outcomes, but would at least prevent further loss of lean body mass and skin breakdown as well as avoid significant electrolyte derangement. Of note, pending SNF work up.   Admit weight: 38 kg Current weight: 36.8 kg EDW: 37.5 kg Last HD tx: 9/11 UF removed: 1.1L  Was receiving daily dialysis for fluid removal on admission. Some weight loss noted r/t this. Blood pressures run low, limiting her ability to pull adequate fluid during txs. Albumin  ordered to aid in managing BPs. Bowels moving. No significant edema noted.    Medications reviewed and include: renal MVI, IV D10W  Sodium remains low, but improving w/ fluid removal. Noted to be receiving dextrose . Lactic acid trending down.   Labs from 9/10 reviewed:  Na+ 127>132 (L) K+ 4.2 (wdl) Cl 88>93 (L) Lactic Acid 3.9>3.2>2.7 (H) CBGs 91-126 x24 hours A1c 6.1 (03/2024)  Diet Order:   Diet Order             Diet regular Room service appropriate? Yes; Fluid consistency: Thin; Fluid restriction: 1200 mL Fluid  Diet effective now             EDUCATION NEEDS:  Not appropriate for education at this time  Skin:  Skin Assessment: Reviewed RN Assessment  Last BM:  9/10 - type 3 x1  Height:  Ht Readings from Last 1 Encounters:  08/31/24 5' 2 (1.575 m)   Weight:  Wt Readings from Last 1 Encounters:  09/07/24 36.8 kg   Ideal Body Weight:  50 kg  BMI:  Body mass index is 14.84 kg/m.  Estimated Nutritional Needs:   Kcal:  1300-1500  Protein:  65-80 grams  Fluid:  1000 mL + UOP  Blair Deaner MS, RD, LDN Registered Dietitian Clinical Nutrition RD Inpatient Contact Info in Amion

## 2024-09-08 ENCOUNTER — Inpatient Hospital Stay (HOSPITAL_COMMUNITY)

## 2024-09-08 DIAGNOSIS — T8241XA Breakdown (mechanical) of vascular dialysis catheter, initial encounter: Secondary | ICD-10-CM | POA: Diagnosis not present

## 2024-09-08 DIAGNOSIS — J9610 Chronic respiratory failure, unspecified whether with hypoxia or hypercapnia: Secondary | ICD-10-CM

## 2024-09-08 LAB — COMPREHENSIVE METABOLIC PANEL WITH GFR
ALT: 19 U/L (ref 0–44)
AST: 42 U/L — ABNORMAL HIGH (ref 15–41)
Albumin: 2.3 g/dL — ABNORMAL LOW (ref 3.5–5.0)
Alkaline Phosphatase: 317 U/L — ABNORMAL HIGH (ref 38–126)
Anion gap: 17 — ABNORMAL HIGH (ref 5–15)
BUN: 41 mg/dL — ABNORMAL HIGH (ref 6–20)
CO2: 21 mmol/L — ABNORMAL LOW (ref 22–32)
Calcium: 8.9 mg/dL (ref 8.9–10.3)
Chloride: 87 mmol/L — ABNORMAL LOW (ref 98–111)
Creatinine, Ser: 4.46 mg/dL — ABNORMAL HIGH (ref 0.44–1.00)
GFR, Estimated: 12 mL/min — ABNORMAL LOW (ref >60)
Glucose, Bld: 93 mg/dL (ref 70–99)
Potassium: 4.8 mmol/L (ref 3.5–5.1)
Sodium: 125 mmol/L — ABNORMAL LOW (ref 135–145)
Total Bilirubin: 2.1 mg/dL — ABNORMAL HIGH (ref 0.0–1.2)
Total Protein: 7.6 g/dL (ref 6.5–8.1)

## 2024-09-08 LAB — CBC WITH DIFFERENTIAL/PLATELET
Abs Immature Granulocytes: 0.06 K/uL (ref 0.00–0.07)
Basophils Absolute: 0 K/uL (ref 0.0–0.1)
Basophils Relative: 0 %
Eosinophils Absolute: 0.1 K/uL (ref 0.0–0.5)
Eosinophils Relative: 1 %
HCT: 34.3 % — ABNORMAL LOW (ref 36.0–46.0)
Hemoglobin: 11.1 g/dL — ABNORMAL LOW (ref 12.0–15.0)
Immature Granulocytes: 1 %
Lymphocytes Relative: 15 %
Lymphs Abs: 1.2 K/uL (ref 0.7–4.0)
MCH: 27.7 pg (ref 26.0–34.0)
MCHC: 32.4 g/dL (ref 30.0–36.0)
MCV: 85.5 fL (ref 80.0–100.0)
Monocytes Absolute: 1.1 K/uL — ABNORMAL HIGH (ref 0.1–1.0)
Monocytes Relative: 14 %
Neutro Abs: 5.5 K/uL (ref 1.7–7.7)
Neutrophils Relative %: 69 %
Platelets: 111 K/uL — ABNORMAL LOW (ref 150–400)
RBC: 4.01 MIL/uL (ref 3.87–5.11)
RDW: 19.6 % — ABNORMAL HIGH (ref 11.5–15.5)
WBC: 7.9 K/uL (ref 4.0–10.5)
nRBC: 0.6 % — ABNORMAL HIGH (ref 0.0–0.2)

## 2024-09-08 LAB — LACTIC ACID, PLASMA
Lactic Acid, Venous: 2.7 mmol/L (ref 0.5–1.9)
Lactic Acid, Venous: 2.6 mmol/L (ref 0.5–1.9)

## 2024-09-08 LAB — GLUCOSE, CAPILLARY
Glucose-Capillary: 105 mg/dL — ABNORMAL HIGH (ref 70–99)
Glucose-Capillary: 75 mg/dL (ref 70–99)
Glucose-Capillary: 82 mg/dL (ref 70–99)
Glucose-Capillary: 90 mg/dL (ref 70–99)
Glucose-Capillary: 92 mg/dL (ref 70–99)
Glucose-Capillary: 95 mg/dL (ref 70–99)

## 2024-09-08 LAB — MRSA NEXT GEN BY PCR, NASAL: MRSA by PCR Next Gen: DETECTED — AB

## 2024-09-08 LAB — CORTISOL: Cortisol, Plasma: 16.6 ug/dL

## 2024-09-08 MED ORDER — NOREPINEPHRINE 4 MG/250ML-% IV SOLN: 0.0000 ug/min | INTRAVENOUS | Status: DC

## 2024-09-08 MED ORDER — CHLORHEXIDINE GLUCONATE CLOTH 2 % EX PADS
6.0000 | MEDICATED_PAD | Freq: Every day | CUTANEOUS | Status: DC
  Administered 2024-09-09 – 2024-09-20 (×12): 6 via TOPICAL

## 2024-09-08 MED ORDER — NEPRO/CARBSTEADY PO LIQD: 1000.0000 mL | ORAL | Status: DC

## 2024-09-08 MED ORDER — CHLORHEXIDINE GLUCONATE CLOTH 2 % EX PADS
6.0000 | MEDICATED_PAD | Freq: Every day | CUTANEOUS | Status: DC
Start: 2024-09-09 — End: 2024-09-08

## 2024-09-08 MED ORDER — ORAL CARE MOUTH RINSE: 15.0000 mL | OROMUCOSAL | Status: DC | PRN

## 2024-09-08 MED ORDER — SODIUM CHLORIDE 0.9 % IV BOLUS
250.0000 mL | Freq: Once | INTRAVENOUS | Status: AC
  Administered 2024-09-08: 250 mL via INTRAVENOUS

## 2024-09-08 MED ORDER — ORAL CARE MOUTH RINSE
15.0000 mL | OROMUCOSAL | Status: DC
  Administered 2024-09-08 – 2024-09-11 (×6): 15 mL via OROMUCOSAL

## 2024-09-08 MED ORDER — NOREPINEPHRINE 4 MG/250ML-% IV SOLN
INTRAVENOUS | Status: AC
  Filled 2024-09-08: qty 250

## 2024-09-08 NOTE — Progress Notes (Addendum)
 NAME:  [name], MRN:  [id number], DOB:  Dec 08, 1981, LOS: 8 ADMISSION DATE:  08/31/2024, CONSULTATION DATE:  08/31/24 REFERRING MD: Dr. 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 Also had decreased appetite and fatigue. Denies fevers, chills. In ED, started on BiPAP. Able to transition to 6L [name]. CXR with pulmonary edema. K 5.3 BNP elevated. Normal WBC. LA 6.2>3.2. Given levaquin . SBP readings low-normal with intermittent readings with SBP in 60s and 70s but mentating well and eating a chicken wrap. PCCM and Nephrology consulted. Plan for hemodialysis in ICU.   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 HD 1.1L 9/5: HD removed 1 with levophed  9/6: HD removed 2L with levophed  9/7: Weaning pressors 9/8 moved out of ICU. Palliative care consulted. Also seen by vascular surg for bleeding of right AV fistula site. Suture placed. Pressure held. Advised against fistulogram, and advised HD cath if issues in future. On-going hypotension w/ HD noted.  9/9 -9/11 on-going supportive care on 9/12 poor po intake. No appetite, no endurance, not able to complete meal.  Started on dextrose  gtt. Called again for hypotension. Got 250 ML and placed supine BP improved.  Interim History / Subjective:  No distress  Objective   Blood  pressure (!) 54/37, pulse 91, temperature (S) (!) 97.5 F (36.4 C), temperature source Oral, resp. rate 19, height 5' 2 (1.575 m), weight 36.8 kg, SpO2 95%.        Intake/Output Summary (Last 24 hours) at 09/08/2024 1526 Last data filed at 09/08/2024 0820 Gross per 24 hour  Intake 200 ml  Output 0 ml  Net 200 ml   Filed Weights   09/05/24 1822 09/07/24 0451 09/07/24 0857  Weight: 34.5 kg 37.7 kg 36.8 kg   Physical Exam: General 43 year old malnourished, chronically ill appearing female. She is resting in bed. Reports discomfort  HENT temporal wasting, mmm no neck vein distention in upright position Pulm dec bases. No accessory use. Room air sats 88% Card tachy reg irreg + holosystolic HM Abd soft Ext warm no edema right UE AVF good bruit and thrill  Neuro oriented. Slow to answer no focal def  Resolved Hospital Problem list   Acute on chronic resp failure AGMA Hyperkalemia  Hypocalcemia  Bleeding from AVF site suture placed  Assessment & Plan:   Acute on chronic Hypotension 2/2 hypovolemia. Volume responsive w/ NS and positional change  Plan Completing current volume challenge Check cortisol Check cbc and lactate; if sig hgb drop would need xfusion (not sure she would need that) Change BP goal to SBP > 90 Cont the TID midodrine  w/ PRN during HD  Chronic biventricular heart failure w/ EF 30-35% w/ severely reduced RV fxn, complicated by valvular heart disease including:mod to severe mitral regurg, severe tricuspid and mod to severe aortic regurg- end stage w/ NYHA III She is not a candidate for surgical or  interventional cardiovascular therapies due to ongoing cocaine  use and overall risk profile.  Plan Cont tele Ensure euvolemia SBP > 90 (need to ensure adequate CA perfusion) Try to avoid extreme tachycardia or bradycardia w/ valvular heart diease component Not candidate for GDMT Not a candidate for valvular multivalvular repair  Chronic Atrial fibrillation Not on  anticoagulation due to hx of subdural hematoma.  Intolerant of BB cause hypotension  Plan Tele  Cont amiodarone    Chronic resp failure. Has been o2 dep w/ on-going element of pulmonary edema Plan Cxr O2 as needed Would not push volume removal currently she is dry   ESRD; Lupus nephritis  Plan Next iHD planned for 9/13 Given BP issues may need to reconsider dry weight goals  IDDM with hypoglycemia  Not eating Plan: Cont to trend CBGs Cont D10 Placing cortrak    Severe protein calorie malnutrition Unintentional wt loss Physical deconditioning, generalized weaknss  plan Will ask RD to make supplemental TF recs PT when appropriate   Best Practice (right click and Reselect all SmartList Selections daily)   Diet/type: Regular diet. DVT prophylaxis: prophylactic heparin   GI prophylaxis: N/A Lines: N/A Foley:  N/A Code Status:  full code   Does not need ICU at this time but suspect she will be in and out if ICU over the remainder of her life as her driving factor of her heart and valvular dysfxn can't be treated and she is not tolerating HD. Although she desires full code I cannot imagine a situation that should she arrest things will only go from bad to even worse. Would continue to work on goals of care    My time 38 min

## 2024-09-08 NOTE — NC FL2 (Signed)
 [place]  MEDICAID FL2 LEVEL OF CARE FORM     IDENTIFICATION  Patient Name: [name] Birthdate: [birth year] Sex: female Admission Date (Current Location): 08/31/2024  [facility] and [place] Number:  Producer, television/film/video and Address:  [facility] [place], [address], [place], [place] [address]      Provider Number:    Attending Physician Name and Address:  Dino Antu, MD  Relative Name and Phone Number:  [name], [name] (Mother)  [phone]    Current Level of Care: Hospital Recommended Level of Care: Skilled Nursing Facility Prior Approval Number:    Date Approved/Denied:   PASRR Number: [id number]  Discharge Plan: SNF    Current Diagnoses: Patient Active Problem List   Diagnosis Date Noted   Palliative care by specialist 09/07/2024   Cardiogenic shock (HCC) 09/01/2024   Acute respiratory failure with hypoxia (HCC) 08/31/2024   Volume overload 05/19/2024   Hypoglycemia 05/19/2024   Dyspnea 05/19/2024   Fluid overload 05/09/2024   Acute hypoxemic respiratory failure (HCC) 05/08/2024   SOB (shortness of breath) 04/17/2024   Protein-calorie malnutrition, severe 01/28/2024   Atrial flutter (HCC) 01/23/2024   Hypotension 01/23/2024   Secondary hypercoagulability disorder (HCC) 01/23/2024   Open thigh wound 01/23/2024   Thrombocytopenia (HCC) 12/14/2023   Atrial fibrillation with RVR (HCC) 12/14/2023   Acute on chronic heart failure (HCC) 12/06/2022   Seizures (HCC) 12/04/2022   R Subdural hematoma (HCC) 12/04/2022   Acute on chronic systolic CHF (congestive heart failure) (HCC) 11/30/2022   Nonrheumatic aortic valve insufficiency 11/30/2022   Nonrheumatic mitral valve regurgitation 11/30/2022   Non-ischemic cardiomyopathy (HCC) 11/30/2022   Acute febrile illness 04/12/2022   Hypertensive urgency 04/12/2022   GERD (gastroesophageal reflux disease) 04/12/2022   HFrEF (heart failure with reduced ejection fraction) (HCC) 04/12/2022    Elevated troponin 04/12/2022   SIRS (systemic inflammatory response syndrome) (HCC) 04/12/2022   Hyperkalemia, diminished renal excretion 12/15/2021   Trichomonas vaginitis 03/25/2021   Bacterial vaginitis 03/25/2021   Encounter for screening for COVID-19 01/24/2021   Hemodialysis access, fistula mature (HCC) 01/22/2021   Allergic reaction 01/22/2021   Acute hyperkalemia 07/28/2020   Nicotine  dependence, cigarettes, uncomplicated 07/28/2020   Essential hypertension 07/28/2020   Substance abuse (HCC) 07/28/2020   Hemodialysis catheter malfunction (HCC) 07/28/2020   Complication of AV dialysis fistula, sequela 07/28/2020   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HCC) 05/18/2020   Bacterial intestinal infection, unspecified 12/14/2019   Left shoulder pain 08/15/2019   Cellulitis, unspecified 05/02/2019   Hyperkalemia 07/29/2018   SVT (supraventricular tachycardia) (HCC) 07/16/2018   Cardiomyopathy, unspecified (HCC) 12/03/2016   Diarrhea 04/18/2016   Acute on chronic combined systolic and diastolic CHF (congestive heart failure) (HCC) 03/16/2016   Other hypervolemia    S/P thoracentesis    Cough with hemoptysis    Myalgia    Pulmonary edema    ESRD (end stage renal disease) (HCC) 03/03/2016   Dependence on renal dialysis (HCC) 10/15/2015   Rash and nonspecific skin eruption 06/26/2015   Secondary Raynaud's phenomenon 06/24/2015   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   Fever, unspecified 08/28/2014   Pruritus, unspecified 08/28/2014   Headache, unspecified 04/30/2014   Pain, unspecified 04/30/2014   Cramping of hands 04/19/2014   Contraceptive management 04/19/2014   Mild protein-calorie malnutrition (HCC) 03/22/2014   Insomnia 03/15/2014   Benign hypertension with ESRD (end-stage renal disease) (HCC) 03/15/2014   Tobacco abuse 02/15/2014   Healthcare maintenance 02/15/2014   Iron  deficiency anemia, unspecified 02/12/2014   Coagulation defect, unspecified (HCC) 02/10/2014  Anemia due to  chronic kidney disease 02/09/2014   Secondary hyperparathyroidism of renal origin (HCC) 02/09/2014   Hypoalbuminemia 02/01/2014   Nephrotic syndrome 02/01/2014   ESRD on dialysis (HCC) 01/31/2014   Pleural effusion, left 01/31/2014   Microcytic anemia 01/29/2014   Hypocalcemia 01/29/2014   Bacteremia 01/23/2013   Marijuana smoker (HCC) 01/18/2013   H/O pericarditis 01/17/2013   Lupus (systemic lupus erythematosus) (HCC) 01/17/2013   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   S/P pericardiocentesis 01/17/2013   H/O pleural effusion 01/17/2013   Nephrosis 01/17/2013   Preseptal cellulitis 01/17/2013   Cardiac tamponade 12/29/2004   Pericardial effusion (noninflammatory) 12/29/2004    Orientation RESPIRATION BLADDER Height & Weight     Self, Time, Situation, Place  Normal Continent Weight: 81 lb 2.1 oz (36.8 kg) Height:  5' 2 (157.5 cm)  BEHAVIORAL SYMPTOMS/MOOD NEUROLOGICAL BOWEL NUTRITION STATUS      Continent Diet (See dc summary)  AMBULATORY STATUS COMMUNICATION OF NEEDS Skin   Limited Assist Verbally Other (Comment) Psychiatrist)                       Personal Care Assistance Level of Assistance  Bathing, Dressing, Feeding Bathing Assistance: Limited assistance Feeding assistance: Independent Dressing Assistance: Limited assistance     Functional Limitations Info  Sight, Hearing, Speech Sight Info: Adequate Hearing Info: Adequate Speech Info: Adequate    SPECIAL CARE FACTORS FREQUENCY  PT (By licensed PT), OT (By licensed OT)     PT Frequency: 5x a week OT Frequency: 5x a week            Contractures Contractures Info: Not present    Additional Factors Info  Code Status, Allergies Code Status Info: FULL Allergies Info: Cephalosporins  Tobramycin Sulfate  Vancomycin            Current Medications (09/08/2024):  This is the current hospital active medication list Current Facility-Administered Medications  Medication Dose Route Frequency Provider Last Rate  Last Admin   acetaminophen  (TYLENOL ) tablet 650 mg  650 mg Oral Q6H PRN Paliwal, Aditya, MD   650 mg at 09/07/24 1102   alteplase  (CATHFLO ACTIVASE ) injection 2 mg  2 mg Intracatheter Once PRN Anderson, Courtney E, NP       amiodarone  (PACERONE ) tablet 400 mg  400 mg Oral BID Raford Riggs, MD   400 mg at 09/08/24 0851   Chlorhexidine  Gluconate Cloth 2 % PADS 6 each  6 each Topical Q0600 Lenon Charmaine BRAVO, NP   6 each at 09/07/24 0532   dextrose  10 % infusion   Intravenous Continuous Rashid, Farhan, MD 50 mL/hr at 09/08/24 0154 New Bag at 09/08/24 0154   diphenhydrAMINE  (BENADRYL ) injection 25 mg  25 mg Intravenous Once Anderson, Courtney E, NP       docusate sodium  (COLACE) capsule 100 mg  100 mg Oral BID PRN Bowser, Grace E, NP       feeding supplement (NEPRO CARB STEADY) liquid 237 mL  237 mL Oral TID BM Kassie Acquanetta Bradley, MD   237 mL at 09/07/24 2337   fentaNYL  (SUBLIMAZE ) injection 25 mcg  25 mcg Intravenous Q2H PRN Howerter, Justin B, DO   25 mcg at 09/08/24 0854   heparin  injection 1,000 Units  1,000 Units Dialysis PRN Lenon Charmaine BRAVO, NP       heparin  injection 5,000 Units  5,000 Units Subcutaneous Q8H Bowser, Ronnald BRAVO, NP   5,000 Units at 09/08/24 0518   lidocaine  (PF) (XYLOCAINE ) 1 % injection 5 mL  5 mL Intradermal PRN Lenon Charmaine BRAVO, NP       lidocaine -prilocaine  (EMLA ) cream 1 Application  1 Application Topical PRN Lenon Charmaine BRAVO, NP       LORazepam  (ATIVAN ) tablet 0.25 mg  0.25 mg Oral Q6H PRN Marvis Locus A, NP   0.25 mg at 09/08/24 0448   melatonin tablet 3 mg  3 mg Oral QHS PRN Mallory Sheela Kieth CHRISTELLA, MD   3 mg at 09/07/24 2238   midodrine  (PROAMATINE ) tablet 10 mg  10 mg Oral TID WC Ula Prentice SAUNDERS, MD   10 mg at 09/08/24 9148   midodrine  (PROAMATINE ) tablet 10 mg  10 mg Oral Daily PRN Anderson, Courtney E, NP   10 mg at 09/07/24 1247   multivitamin (RENA-VIT) tablet 1 tablet  1 tablet Oral QHS Kassie Acquanetta Bradley, MD   1 tablet at 09/07/24 2227   naloxone  (NARCAN )  injection 0.4 mg  0.4 mg Intravenous PRN Howerter, Justin B, DO       Oral care mouth rinse  15 mL Mouth Rinse 4 times per day Kassie Acquanetta Bradley, MD   15 mL at 09/07/24 2338   Oral care mouth rinse  15 mL Mouth Rinse PRN Kassie Acquanetta Bradley, MD       oxidized cellulose (Surgicel) pad 1 each  1 each Topical Once Koomson, Julius, MD       pentafluoroprop-tetrafluoroeth (GEBAUERS) aerosol 1 Application  1 Application Topical PRN Lenon Charmaine BRAVO, NP       polyethylene glycol (MIRALAX  / GLYCOLAX ) packet 17 g  17 g Oral Daily PRN Bowser, Grace E, NP       sertraline  (ZOLOFT ) tablet 25 mg  25 mg Oral Daily Koomson, Julius, MD   25 mg at 09/08/24 9145     Discharge Medications: Please see discharge summary for a list of discharge medications.  Relevant Imaging Results:  Relevant Lab Results:   Additional Information SSN: [id number] out-pt HD at [facility] [place] on TTS  [place], LCSWA

## 2024-09-08 NOTE — Progress Notes (Addendum)
 [facility] Progress Note   Subjective:    Seen and examined patient at bedside. Remains on RA. Noted on dextrose  drip for severe hypoglycemia 2nd poor PO intake. Palliative saw the patient and her wishes are to continue being a full code. Next HD 9/13.  Objective Vitals:   09/07/24 1548 09/07/24 2028 09/08/24 0518 09/08/24 0824  BP: (!) 112/52 (!) 131/53 (!) 131/44 (!) 119/46  Pulse:  82 80 62  Resp:  16 17 18   Temp:  97.7 F (36.5 C) 98 F (36.7 C) (!) 97.5 F (36.4 C)  TempSrc:  Oral    SpO2:  100% 100% (!) 82%  Weight:      Height:       Physical Exam General: Awake, alert, NAD, cachectic, ill-appearing, on O2 on and off. On RA on exam Heart: RRR; No MRGs Lungs: normal WOB, more clear posteriorly Abdomen: Soft and non-tender Extremities: No LE edema Dialysis Access: AVF +t/b   Filed Weights   09/05/24 1822 09/07/24 0451 09/07/24 0857  Weight: 34.5 kg 37.7 kg 36.8 kg    Intake/Output Summary (Last 24 hours) at 09/08/2024 1124 Last data filed at 09/08/2024 0820 Gross per 24 hour  Intake 200 ml  Output 1100 ml  Net -900 ml    Additional Objective Labs: Basic Metabolic Panel: Recent Labs  Lab 09/03/24 0236 09/05/24 1410 09/06/24 0332  NA 132* 127* 132*  K 3.8 4.1 4.2  CL 92* 88* 93*  CO2 24 24 25   GLUCOSE 96 126* 91  BUN 24* 47* 30*  CREATININE 3.62* 5.04* 3.78*  CALCIUM  9.7 9.3 9.4  PHOS  --  5.5* 4.3   Liver Function Tests: Recent Labs  Lab 09/05/24 1410 09/06/24 0332  ALBUMIN  2.3* 2.4*   No results for input(s): LIPASE, AMYLASE in the last 168 hours. CBC: Recent Labs  Lab 09/02/24 0817 09/03/24 0236 09/05/24 1410 09/06/24 0332  WBC 7.4 7.0 8.8 8.5  HGB 12.3 13.1 11.7* 11.9*  HCT 38.1 39.6 35.2* 36.0  MCV 85.2 83.4 83.8 84.3  PLT 147* 159 159 130*   Blood Culture    Component Value Date/Time   SDES BLOOD SITE NOT SPECIFIED 08/31/2024 1223   SDES BLOOD SITE NOT SPECIFIED 08/31/2024 1223   SPECREQUEST  08/31/2024  1223    BOTTLES DRAWN AEROBIC ONLY Blood Culture results may not be optimal due to an inadequate volume of blood received in culture bottles   SPECREQUEST  08/31/2024 1223    BOTTLES DRAWN AEROBIC ONLY Blood Culture adequate volume   CULT  08/31/2024 1223    NO GROWTH 5 DAYS Performed at [facility], [address], [place], [place] [address]    CULT  08/31/2024 1223    NO GROWTH 5 DAYS Performed at [facility], [address] [place], [place] [address]    REPTSTATUS 09/05/2024 FINAL 08/31/2024 1223   REPTSTATUS 09/05/2024 FINAL 08/31/2024 1223    Cardiac Enzymes: No results for input(s): CKTOTAL, CKMB, CKMBINDEX, TROPONINI in the last 168 hours. CBG: Recent Labs  Lab 09/07/24 1754 09/07/24 2247 09/08/24 0147 09/08/24 0438 09/08/24 1054  GLUCAP 127* 89 90 82 95   Iron  Studies: No results for input(s): IRON , TIBC, TRANSFERRIN, FERRITIN in the last 72 hours. Lab Results  Component Value Date   INR 1.8 (H) 09/01/2024   INR 1.4 (H) 12/16/2023   INR 1.5 (H) 12/13/2023   Studies/Results: No results found.  Medications:  dextrose  50 mL/hr at 09/08/24 0154  amiodarone   400 mg Oral BID   Chlorhexidine  Gluconate Cloth  6 each Topical Q0600   diphenhydrAMINE   25 mg Intravenous Once   feeding supplement (NEPRO CARB STEADY)  237 mL Oral TID BM   heparin   5,000 Units Subcutaneous Q8H   midodrine   10 mg Oral TID WC   multivitamin  1 tablet Oral QHS   mouth rinse  15 mL Mouth Rinse 4 times per day   oxidized cellulose  1 each Topical Once   sertraline   25 mg Oral Daily    Dialysis Orders: 3h  B400   37.5kg   2K bath  AVF  Heparin  none Post 9/02 wt 38.2kg, usual comes off 0-1.5kg over Hb 12, no esa   Home bp meds: Midodrine  10 tid  Assessment/Plan: SOB/ pulm edema: per CXR. Not grossly overloaded, O2 appears to be slowly weaning down and now on RA in her room this morning, not in distress. Had HD in ICU Friday and Saturday last week,  low UF each session d/t low bp's. Pressors are off and remains on Midodrine  TID. Using IV Albumin  for additional BP support.  ESRD: on HD TTS. Back on schedule. Next HD 9/13. See below for access plan Vascular Access: Noted AVF started bleeding on 9/8 after venous needle was pulled. VVS on board. A stitch, manual pressure, and dressing were applied. No issues since then. Have an experienced cannulator stick her moving forward. If problems were to re-occur she will need TDC placement at that point. Hypotension: Cardiac in origin.  midodrine  continuing here,pre and mid doses Volume: not grossly vol overloaded, UF with HD to maintain euvolemia, po intake hasn't been very good.  Hypoglycemia: 2nd poor PO intake. On dextrose  drip but need to watch volume given ESRD. Per primary Anemia of esrd: Hb 12-15, not on esa at OP unit Secondary hyperPTH: phos reasonable  Chronic systolic HF/  RV dysfunction/ valve disease: not a candidate for surgical or other invasive therapies due to ongoing cocaine  and high risk  A fib/FL: on amio, no BB due to hypotension, no anticoag due to h/o subdural hematoma.  GOC: Pall care following and well known to patient - cont full scope.  Charmaine Piety, NP [facility] 09/08/2024,11:24 AM  LOS: 8 days

## 2024-09-08 NOTE — Progress Notes (Addendum)
 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 Also had decreased appetite and fatigue. Denies fevers, chills. In ED, started on BiPAP. Able to transition to 6L [place]. CXR with pulmonary edema. K 5.3 BNP elevated. Normal WBC. LA 6.2>3.2. Given levaquin . SBP readings low-normal with intermittent readings with SBP in 60s and 70s but mentating well and eating a chicken wrap. PCCM and Nephrology consulted. Plan for hemodialysis in ICU.    While in ICU, patient had daily HD with ultrafiltration with vasopressor support from 9/4-9/6.  She was weaned off vasopressor on 9/7 and tolerated HD with ultrafiltration of 1.5 L on 9/8.  Transferred to hospitalist service on 9/9.  Bleeding from right AVF after venous needle was pulled on 9/8 requiring stitch, manual pressure and dressing.  VVS recommended HD cath if issues with fistula persist. Pending placement to SNF.   Assessment & Plan:  Active Problems:   Hemodialysis catheter malfunction (HCC)   Atrial flutter (HCC)   Lupus (systemic lupus erythematosus) (HCC)   Insomnia   Acute on chronic combined systolic and diastolic CHF (congestive heart failure) (HCC)   Fluid overload   Acute respiratory failure with hypoxia (HCC)   Cardiogenic shock (HCC)   Palliative care by specialist   Acute hypoxic respiratory failure due to pulmonary edema-improved.  Currently saturating at 100% on 3 L by [place]. Repeat CXR with pulmonary edema.  Pneumonia ruled out. -Patient had serial daily HD with ultrafiltration since admission. -Last HD was on 9/9 -Wean oxygen as able.  Goal O2 saturation > 88%   Acute on Chronic heart  failure/biventricular heart failure: NYHA III. TTE with LVEF of 30 to 35%, severely reduced RVF, sev TR, mod-sev AR and moderate MR.  Repeat CXR this morning with  pulmonary edema.  -Cardiology signed off. -Not a candidate for GDMT in the setting of hypotension and being hemodialysis dependent.   Mod mitral valve regurg Sev tricuspid valve regurg Mod-sev aortic valve regurg  -Deemed to be not candidate for interventional therapies due to cocaine  use and overall risk profile.  -Appreciate cardiology input -Volume removal via dialysis.    Long standing persistent Atrial fibrillation/flutter:  Not on AC due to history of SDH. -Continue amiodarone  400 mg twice daily for now.  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 -Nephrology following.   IDDM with level 1 hypoglycemia: A1c 6.1%. No acute issues  -Secondary to poor oral intake Continue with dextrose  infusion.   Severe protein calorie malnutrition Unintentional weight loss Failure to thrive - Palliative medicine following.  Body mass index is 13.9 Nutrition Problem: Severe Malnutrition Etiology: chronic illness (ESRD, HFrEF) Signs/Symptoms: severe fat depletion, severe muscle depletion Interventions: Magic cup, MVI, Nepro shake, Refer to RD note for recommendations   Generalized weakness/physical deconditioning: Reports independent ambulating at baseline. - PT/OT eval-recommendation is SNF     DVT prophylaxis: heparin  injection 5,000 Units Start: 08/31/24 1400 SCDs Start: 08/31/24 1052     Code Status: Full Code Family Communication:  None at the bedside Status is: Inpatient Remains inpatient appropriate because: HD, low BP, pending SNF  Subjective:  No acute events overnight. HD done yesterday. She looks tired and drowsy this morning. She hasn't yet chosen a SNF to go.  Examination:  General exam: Appears frail, chronically  ill looking Respiratory system: Clear to auscultation. Respiratory effort normal. Cardiovascular system: S1 & S2 heard, RRR. No JVD, murmurs, rubs, gallops or clicks. No pedal edema. Gastrointestinal system: Abdomen is nondistended, soft and nontender. No organomegaly or masses felt. Normal bowel sounds heard. Central nervous system: Alert and oriented. No focal neurological deficits. Extremities: Symmetric 5 x 5 power. Rt arm AVF in place        Diet Orders (From admission, onward)     Start     Ordered   09/07/24 1555  Diet regular Room service appropriate? Yes with Assist; Fluid consistency: Thin; Fluid restriction: 1200 mL Fluid  Diet effective now       Question Answer Comment  Room service appropriate? Yes with Assist   Fluid consistency: Thin   Fluid restriction: 1200 mL Fluid      09/07/24 1554            Objective: Vitals:   09/07/24 1548 09/07/24 2028 09/08/24 0518 09/08/24 0824  BP: (!) 112/52 (!) 131/53 (!) 131/44 (!) 119/46  Pulse:  82 80 62  Resp:  16 17 18   Temp:  97.7 F (36.5 C) 98 F (36.7 C) (!) 97.5 F (36.4 C)  TempSrc:  Oral    SpO2:  100% 100% (!) 82%  Weight:      Height:        Intake/Output Summary (Last 24 hours) at 09/08/2024 0951 Last data filed at 09/08/2024 0820 Gross per 24 hour  Intake 200 ml  Output 1100 ml  Net -900 ml    Filed Weights   09/05/24 1822 09/07/24 0451 09/07/24 0857  Weight: 34.5 kg 37.7 kg 36.8 kg    Scheduled Meds:  amiodarone   400 mg Oral BID   Chlorhexidine  Gluconate Cloth  6 each Topical Q0600   diphenhydrAMINE   25 mg Intravenous Once   feeding supplement (NEPRO CARB STEADY)  237 mL Oral TID BM   heparin   5,000 Units Subcutaneous Q8H   midodrine   10 mg Oral TID WC   multivitamin  1 tablet Oral QHS   mouth rinse  15 mL Mouth Rinse 4 times per day   oxidized cellulose  1 each Topical Once   sertraline   25 mg Oral Daily   Continuous Infusions:  dextrose  50 mL/hr at 09/08/24 0154    Nutritional  status Signs/Symptoms: severe fat depletion, severe muscle depletion Interventions: Magic cup, MVI, Nepro shake, Refer to RD note for recommendations Body mass index is 14.84 kg/m.  Data Reviewed:   CBC: Recent Labs  Lab 09/02/24 0817 09/03/24 0236 09/05/24 1410 09/06/24 0332  WBC 7.4 7.0 8.8 8.5  HGB 12.3 13.1 11.7* 11.9*  HCT 38.1 39.6 35.2* 36.0  MCV 85.2 83.4 83.8 84.3  PLT 147* 159 159 130*   Basic Metabolic Panel: Recent Labs  Lab 09/02/24 0817 09/03/24 0236 09/05/24 1410 09/06/24 0332  NA 132* 132* 127* 132*  K 4.2 3.8 4.1 4.2  CL 91* 92* 88* 93*  CO2 21* 24 24 25   GLUCOSE 113* 96 126* 91  BUN 30* 24* 47* 30*  CREATININE 4.34* 3.62* 5.04* 3.78*  CALCIUM  9.4 9.7 9.3 9.4  MG  --   --  2.3 2.2  PHOS  --   --  5.5* 4.3   GFR: Estimated Creatinine Clearance: 11.5  mL/min (A) (by C-G formula based on SCr of 3.78 mg/dL (H)). Liver Function Tests: Recent Labs  Lab 09/05/24 1410 09/06/24 0332  ALBUMIN  2.3* 2.4*   No results for input(s): LIPASE, AMYLASE in the last 168 hours. No results for input(s): AMMONIA in the last 168 hours. Coagulation Profile: Recent Labs  Lab 09/01/24 1554  INR 1.8*   Cardiac Enzymes: No results for input(s): CKTOTAL, CKMB, CKMBINDEX, TROPONINI in the last 168 hours. BNP (last 3 results) No results for input(s): PROBNP in the last 8760 hours. HbA1C: No results for input(s): HGBA1C in the last 72 hours. CBG: Recent Labs  Lab 09/07/24 1725 09/07/24 1754 09/07/24 2247 09/08/24 0147 09/08/24 0438  GLUCAP 67* 127* 89 90 82   Lipid Profile: No results for input(s): CHOL, HDL, LDLCALC, TRIG, CHOLHDL, LDLDIRECT in the last 72 hours. Thyroid Function Tests: No results for input(s): TSH, T4TOTAL, FREET4, T3FREE, THYROIDAB in the last 72 hours. Anemia Panel: No results for input(s): VITAMINB12, FOLATE, FERRITIN, TIBC, IRON , RETICCTPCT in the last 72 hours. Sepsis Labs: Recent  Labs  Lab 09/02/24 0817 09/04/24 0220 09/06/24 0332  LATICACIDVEN 3.9* 3.2* 2.7*    Recent Results (from the past 240 hours)  Resp panel by RT-PCR (RSV, Flu A&B, Covid) Anterior Nasal Swab     Status: None   Collection Time: 08/31/24  8:54 AM   Specimen: Anterior Nasal Swab  Result Value Ref Range Status   SARS Coronavirus 2 by RT PCR NEGATIVE NEGATIVE Final   Influenza A by PCR NEGATIVE NEGATIVE Final   Influenza B by PCR NEGATIVE NEGATIVE Final    Comment: (NOTE) The Xpert Xpress SARS-CoV-2/FLU/RSV plus assay is intended as an aid in the diagnosis of influenza from Nasopharyngeal swab specimens and should not be used as a sole basis for treatment. Nasal washings and aspirates are unacceptable for Xpert Xpress SARS-CoV-2/FLU/RSV testing.  Fact Sheet for Patients: BloggerCourse.com  Fact Sheet for Healthcare Providers: SeriousBroker.it  This test is not yet approved or cleared by the [place]  FDA and has been authorized for detection and/or diagnosis of SARS-CoV-2 by FDA under an Emergency Use Authorization (EUA). This EUA will remain in effect (meaning this test can be used) for the duration of the COVID-19 declaration under Section 564(b)(1) of the Act, 21 U.S.C. section 360bbb-3(b)(1), unless the authorization is terminated or revoked.     Resp Syncytial Virus by PCR NEGATIVE NEGATIVE Final    Comment: (NOTE) Fact Sheet for Patients: BloggerCourse.com  Fact Sheet for Healthcare Providers: SeriousBroker.it  This test is not yet approved or cleared by the [place]  FDA and has been authorized for detection and/or diagnosis of SARS-CoV-2 by FDA under an Emergency Use Authorization (EUA). This EUA will remain in effect (meaning this test can be used) for the duration of the COVID-19 declaration under Section 564(b)(1) of the Act, 21 U.S.C. section  360bbb-3(b)(1), unless the authorization is terminated or revoked.  Performed at [facility], [address], [place], [place] [address]   MRSA Next Gen by PCR, Nasal     Status: Abnormal   Collection Time: 08/31/24 12:04 PM   Specimen: Nasal Mucosa; Nasal Swab  Result Value Ref Range Status   MRSA by PCR Next Gen DETECTED (A) NOT DETECTED Final    Comment: RESULT CALLED TO, READ BACK BY AND VERIFIED WITH: RN TERESA CRITE ON 08/31/24 @ 1420 BY DRT (NOTE) The GeneXpert MRSA Assay (FDA approved for NASAL specimens only), is one component of a comprehensive MRSA colonization  surveillance program. It is not intended to diagnose MRSA infection nor to guide or monitor treatment for MRSA infections. Test performance is not FDA approved in patients less than 67 years old. Performed at [facility], [address] [place], [place] [address]   Culture, blood (Routine X 2) w Reflex to ID Panel     Status: None   Collection Time: 08/31/24 12:23 PM   Specimen: BLOOD  Result Value Ref Range Status   Specimen Description BLOOD SITE NOT SPECIFIED  Final   Special Requests   Final    BOTTLES DRAWN AEROBIC ONLY Blood Culture results may not be optimal due to an inadequate volume of blood received in culture bottles   Culture   Final    NO GROWTH 5 DAYS Performed at [facility], [address], [place], [place] [address]    Report Status 09/05/2024 FINAL  Final  Culture, blood (Routine X 2) w Reflex to ID Panel     Status: None   Collection Time: 08/31/24 12:23 PM   Specimen: BLOOD  Result Value Ref Range Status   Specimen Description BLOOD SITE NOT SPECIFIED  Final   Special Requests   Final    BOTTLES DRAWN AEROBIC ONLY Blood Culture adequate volume   Culture   Final    NO GROWTH 5 DAYS Performed at [facility], [address] [place], [place] [address]    Report Status 09/05/2024 FINAL  Final  Respiratory (~20 pathogens) panel by PCR     Status: None    Collection Time: 08/31/24  2:01 PM   Specimen: Nasopharyngeal Swab; Respiratory  Result Value Ref Range Status   Adenovirus NOT DETECTED NOT DETECTED Final   Coronavirus 229E NOT DETECTED NOT DETECTED Final    Comment: (NOTE) The Coronavirus on the Respiratory Panel, DOES NOT test for the novel  Coronavirus (2019 nCoV)    Coronavirus HKU1 NOT DETECTED NOT DETECTED Final   Coronavirus NL63 NOT DETECTED NOT DETECTED Final   Coronavirus OC43 NOT DETECTED NOT DETECTED Final   Metapneumovirus NOT DETECTED NOT DETECTED Final   Rhinovirus / Enterovirus NOT DETECTED NOT DETECTED Final   Influenza A NOT DETECTED NOT DETECTED Final   Influenza B NOT DETECTED NOT DETECTED Final   Parainfluenza Virus 1 NOT DETECTED NOT DETECTED Final   Parainfluenza Virus 2 NOT DETECTED NOT DETECTED Final   Parainfluenza Virus 3 NOT DETECTED NOT DETECTED Final   Parainfluenza Virus 4 NOT DETECTED NOT DETECTED Final   Respiratory Syncytial Virus NOT DETECTED NOT DETECTED Final   Bordetella pertussis NOT DETECTED NOT DETECTED Final   Bordetella Parapertussis NOT DETECTED NOT DETECTED Final   Chlamydophila pneumoniae NOT DETECTED NOT DETECTED Final   Mycoplasma pneumoniae NOT DETECTED NOT DETECTED Final    Comment: Performed at [facility], [address] [place], [place] [address]         Radiology Studies: No results found.      LOS: 8 days   Time spent= 39 mins    Deliliah Room, MD [facility]  If 7PM-7AM, please contact night-coverage  09/08/2024, 9:51 AM

## 2024-09-08 NOTE — Significant Event (Signed)
 Rapid Response Event Note   Reason for Call :  [place]at O2 50s  Initial Focused Assessment:  Patient in bed on NRB states it feels harder to breathe and it suddenly got worse a little bit ago. Pt denies chest pain, etc. Lungs remained diminished compared to earlier assessment. Skin warm/dry.   SBP 120s HR 80s RR 25 O2 85-92% NRB 15L   Interventions/Plan of Care:  Bipap placed O2 now 99% CCM NP to bedside ICU transfer orders  Event Summary:  MD Notified: Dino DIONE Mulder NP Call Time: 1827 Arrival Time: 1830 End Time:   Tonna Chiquita POUR, RN

## 2024-09-08 NOTE — TOC Progression Note (Signed)
 Transition of Care [facility]) - Progression Note    Patient Details  Name: [name] MRN: [id number] Date of Birth: December 09, 1981  [facility]) CM/SW Contact  Lendia Dais, [place] Phone Number: 09/08/2024, 11:02 AM  Clinical Narrative:   CSW spoke to patient at bedside.  CSW informed patient of PT recs of SNF and the patient was agreeable. CSW gave patient the medicare choice form.   CSW sent referrals in the HUB, bed offers pending.  Inpatient Care Management (ICM) will continue to follow.    Expected Discharge Plan: Home/Self Care Barriers to Discharge: Continued Medical Work up               Expected Discharge Plan and Services       Living arrangements for the past 2 months: Single Family Home                                       Social Drivers of Health (SDOH) Interventions SDOH Screenings   Food Insecurity: Patient Declined (09/04/2024)  Housing: Low Risk  (05/20/2024)  Transportation Needs: No Transportation Needs (05/20/2024)  Utilities: Not At Risk (05/20/2024)  Alcohol  Screen: Low Risk  (05/31/2024)  Depression (PHQ2-9): High Risk (05/31/2024)  Financial Resource Strain: Low Risk  (05/31/2024)  Physical Activity: Inactive (05/31/2024)  Social Connections: Socially Isolated (05/31/2024)  Stress: Stress Concern Present (05/31/2024)  Tobacco Use: High Risk (09/05/2024)  Health Literacy: Adequate Health Literacy (05/31/2024)    Readmission Risk Interventions    05/22/2024   11:49 AM 04/17/2022    8:36 AM  Readmission Risk Prevention Plan  Transportation Screening Complete Complete  PCP or Specialist Appt within 3-5 Days  Complete  HRI or Home Care Consult  Complete  Social Work Consult for Recovery Care Planning/Counseling  Complete  Palliative Care Screening  Not Applicable  Medication Review Oceanographer) Complete Complete  PCP or Specialist appointment within 3-5 days of discharge Complete   HRI or Home Care Consult Complete    Palliative Care Screening Not Applicable   Skilled Nursing Facility Not Applicable

## 2024-09-08 NOTE — Progress Notes (Signed)
 Interval   I saw ms Nicholaus earlier for hypotension. Seemed volume responsive.  We gave a small bolus of fluid Checked labs Cortisol 16.6 Na 125  Lactate 2.7  Called back to room sats down. Nursing staff rapidly titrating FIO2 up to 15 liters. WOB still increased FIO2 requirements PCXR did show increased edema Pt looks about the same re: WOB but sats on NRB when I arrived were 88%  On exam BP 125/80 (BP Location: Left Leg)   Pulse 95   Temp 98 F (36.7 C)   Resp (!) 23   Ht 5' 2 (1.575 m)   Wt 36.8 kg   LMP  (LMP Unknown) Comment: states over 2 years ago  SpO2 95%   BMI 14.84 kg/m   Sats now 99% General critically ill 43 year old female  HENT NCAT BIPAP mask in place Pulm faint rales Card rapid irreg  Impression/plan Acute hypoxic resp failure 2/2 worsening pulmonary edema  Acute on chronic biventricular HF w/ severe valvular disease ESRD FTT  Discussion Not many options here. WIll try to temporize w/ NIPPV. She has iHD scheduled for am. Not sure she will tolerate it w/out sig hypotension. She is not a candidate for any advanced Mechanical support or valvular surgery. I spoke to her sister as well as her mother and the patient herself. I told them there is nothing really we can offer. Transfers back in and out of the ICU will be the rest of her life until she suffers a cardiac arrest. I am very concerned that she is likely to die on a ventilator with no option for long term dialysis. I have been very clear with the patient and her family. I have informed them that I hope to temporize with current interventions but when the patients work of breathing is better we need to revisit Code status and goals of care. This is NOT survivable in long term   Plan Move to ICU Support on NIPPV Cont tele Keep euvolemic Dialysis if able to tolerate in am.  I don't think escalation to CRRT or adding inotropic support is going to change outcome here  This is very close to futile care     My cct 34 min  Maude FORBES Banner ACNP-BC [facility] Pulmonary/Critical Care Pager # [phone] OR # [phone] if no answer

## 2024-09-08 NOTE — Progress Notes (Signed)
 PT Cancellation Note  Patient Details Name: [name] MRN: [id number] DOB: August 10, 1981   Cancelled Treatment:    Reason Eval/Treat Not Completed: Other (comment) Attempted to see pt for PT tx; pt lying in bed reporting she is not getting OOB & declining bed level exercises despite encouragement/education. Will f/u as able.  Richerd Pinal, PT, DPT 09/08/24, 10:49 AM   Richerd CHRISTELLA Pinal 09/08/2024, 10:49 AM

## 2024-09-08 NOTE — Significant Event (Signed)
 Rapid Response Event Note   Reason for Call :  Hypotensive SBP 50s  Initial Focused Assessment:  Young cachetic female, lying in bed, A&O, states she feels very weak and tired today. Also complains of left arm pain. Skin warm/dry, no edema noted. Lungs diminished.  HD yesterday. O2 on/off as needed.   82/66 (69) HR 80s RR 25 O2 100% 3L [place] CBG 105  Interventions/Plan of Care:  250ml NS bolus already infusing  Repositioned to supine CCM consulted, to bedside  126/45 (67) HR 80 RR 19 O2 96%  Event Summary:  MD Notified: PHEBE Room MD, CCM Dewald MD, MYRTIS Banner NP Call Time: [phone] Arrival Time: 1522 End Time:   Tonna Chiquita POUR, RN

## 2024-09-09 DIAGNOSIS — I5082 Biventricular heart failure: Secondary | ICD-10-CM | POA: Diagnosis not present

## 2024-09-09 DIAGNOSIS — E43 Unspecified severe protein-calorie malnutrition: Secondary | ICD-10-CM

## 2024-09-09 DIAGNOSIS — I4891 Unspecified atrial fibrillation: Secondary | ICD-10-CM

## 2024-09-09 DIAGNOSIS — I959 Hypotension, unspecified: Secondary | ICD-10-CM | POA: Diagnosis not present

## 2024-09-09 DIAGNOSIS — E11649 Type 2 diabetes mellitus with hypoglycemia without coma: Secondary | ICD-10-CM

## 2024-09-09 DIAGNOSIS — J9600 Acute respiratory failure, unspecified whether with hypoxia or hypercapnia: Secondary | ICD-10-CM

## 2024-09-09 DIAGNOSIS — Z992 Dependence on renal dialysis: Secondary | ICD-10-CM

## 2024-09-09 LAB — GLUCOSE, CAPILLARY
Glucose-Capillary: 106 mg/dL — ABNORMAL HIGH (ref 70–99)
Glucose-Capillary: 120 mg/dL — ABNORMAL HIGH (ref 70–99)
Glucose-Capillary: 38 mg/dL — CL (ref 70–99)
Glucose-Capillary: 72 mg/dL (ref 70–99)
Glucose-Capillary: 73 mg/dL (ref 70–99)
Glucose-Capillary: 93 mg/dL (ref 70–99)
Glucose-Capillary: 98 mg/dL (ref 70–99)

## 2024-09-09 MED ORDER — OXYCODONE HCL 5 MG PO TABS
5.0000 mg | ORAL_TABLET | Freq: Four times a day (QID) | ORAL | Status: DC | PRN
  Administered 2024-09-09 – 2024-09-10 (×2): 5 mg via ORAL
  Filled 2024-09-09 (×2): qty 1

## 2024-09-09 MED ORDER — LIDOCAINE-PRILOCAINE 2.5-2.5 % EX CREA: 1.0000 | TOPICAL_CREAM | CUTANEOUS | Status: DC | PRN

## 2024-09-09 MED ORDER — HEPARIN SODIUM (PORCINE) 5000 UNIT/ML IJ SOLN
5000.0000 [IU] | Freq: Three times a day (TID) | INTRAMUSCULAR | Status: DC
  Administered 2024-09-09 – 2024-09-20 (×33): 5000 [IU] via SUBCUTANEOUS
  Filled 2024-09-09 (×32): qty 1

## 2024-09-09 MED ORDER — THIAMINE MONONITRATE 100 MG PO TABS
100.0000 mg | ORAL_TABLET | Freq: Every day | ORAL | Status: DC
Start: 2024-09-10 — End: 2024-09-11
  Administered 2024-09-10 – 2024-09-11 (×2): 100 mg via ORAL
  Filled 2024-09-09 (×2): qty 1

## 2024-09-09 MED ORDER — OXYCODONE HCL 5 MG PO TABS
7.5000 mg | ORAL_TABLET | Freq: Four times a day (QID) | ORAL | Status: DC | PRN
  Administered 2024-09-10 – 2024-09-15 (×5): 7.5 mg via ORAL
  Filled 2024-09-09 (×5): qty 2

## 2024-09-09 MED ORDER — PENTAFLUOROPROP-TETRAFLUOROETH EX AERO: 1.0000 | INHALATION_SPRAY | CUTANEOUS | Status: DC | PRN

## 2024-09-09 MED ORDER — LIDOCAINE HCL (PF) 1 % IJ SOLN: 5.0000 mL | INTRAMUSCULAR | Status: DC | PRN

## 2024-09-09 MED ORDER — HEPARIN SODIUM (PORCINE) 1000 UNIT/ML DIALYSIS
1000.0000 [IU] | INTRAMUSCULAR | Status: DC | PRN
  Administered 2024-09-19: 2800 [IU] via INTRAVENOUS_CENTRAL
  Filled 2024-09-09: qty 1

## 2024-09-09 MED ORDER — ALTEPLASE 2 MG IJ SOLR: 2.0000 mg | Freq: Once | INTRAMUSCULAR | Status: DC | PRN

## 2024-09-09 MED ORDER — ACETAMINOPHEN 325 MG PO TABS
650.0000 mg | ORAL_TABLET | Freq: Four times a day (QID) | ORAL | Status: DC | PRN
  Administered 2024-09-10 – 2024-09-13 (×5): 650 mg via ORAL
  Filled 2024-09-09 (×5): qty 2

## 2024-09-09 NOTE — Progress Notes (Signed)
 [facility] Progress Note   Subjective:    Had hypoTN on floor yesterday and rec'd 0.5L bolus to which BP responded however she went into hypoxic resp failure and moved to ICU for bipap which is at 90% FiO2 currently.    Objective Vitals:   09/09/24 0530 09/09/24 0600 09/09/24 0630 09/09/24 0710  BP: (!) 106/58 (!) 130/54 (!) 124/59 135/76  Pulse: (!) 105 (!) 105 (!) 105 (!) 104  Resp: 18 20 (!) 21 19  Temp:      TempSrc:      SpO2: 100% 100% 100% 100%  Weight:      Height:       Physical Exam General: Awake, alert, NAD, cachectic, ill-appearing, on bipap per above Heart: RRR; No MRGs Lungs: rales in bases Abdomen: Soft and non-tender Extremities: No LE edema Dialysis Access: AVF +t/b - weak but present  Filed Weights   09/08/24 0701 09/08/24 2015 09/09/24 0447  Weight: 36.8 kg 38.8 kg 38.3 kg    Intake/Output Summary (Last 24 hours) at 09/09/2024 0737 Last data filed at 09/09/2024 0700 Gross per 24 hour  Intake 3493.36 ml  Output 0 ml  Net 3493.36 ml    Additional Objective Labs: Basic Metabolic Panel: Recent Labs  Lab 09/05/24 1410 09/06/24 0332 09/08/24 1724  NA 127* 132* 125*  K 4.1 4.2 4.8  CL 88* 93* 87*  CO2 24 25 21*  GLUCOSE 126* 91 93  BUN 47* 30* 41*  CREATININE 5.04* 3.78* 4.46*  CALCIUM  9.3 9.4 8.9  PHOS 5.5* 4.3  --    Liver Function Tests: Recent Labs  Lab 09/05/24 1410 09/06/24 0332 09/08/24 1724  AST  --   --  42*  ALT  --   --  19  ALKPHOS  --   --  317*  BILITOT  --   --  2.1*  PROT  --   --  7.6  ALBUMIN  2.3* 2.4* 2.3*   No results for input(s): LIPASE, AMYLASE in the last 168 hours. CBC: Recent Labs  Lab 09/02/24 0817 09/03/24 0236 09/05/24 1410 09/06/24 0332 09/08/24 1724  WBC 7.4 7.0 8.8 8.5 7.9  NEUTROABS  --   --   --   --  5.5  HGB 12.3 13.1 11.7* 11.9* 11.1*  HCT 38.1 39.6 35.2* 36.0 34.3*  MCV 85.2 83.4 83.8 84.3 85.5  PLT 147* 159 159 130* 111*   Blood Culture    Component Value  Date/Time   SDES BLOOD SITE NOT SPECIFIED 08/31/2024 1223   SDES BLOOD SITE NOT SPECIFIED 08/31/2024 1223   SPECREQUEST  08/31/2024 1223    BOTTLES DRAWN AEROBIC ONLY Blood Culture results may not be optimal due to an inadequate volume of blood received in culture bottles   SPECREQUEST  08/31/2024 1223    BOTTLES DRAWN AEROBIC ONLY Blood Culture adequate volume   CULT  08/31/2024 1223    NO GROWTH 5 DAYS Performed at [facility], [address] [place], [place] [address]    CULT  08/31/2024 1223    NO GROWTH 5 DAYS Performed at [facility], [address] [place], [place] [address]    REPTSTATUS 09/05/2024 FINAL 08/31/2024 1223   REPTSTATUS 09/05/2024 FINAL 08/31/2024 1223    Cardiac Enzymes: No results for input(s): CKTOTAL, CKMB, CKMBINDEX, TROPONINI in the last 168 hours. CBG: Recent Labs  Lab 09/08/24 1441 09/08/24 1613 09/08/24 2342 09/09/24 0336 09/09/24 0630  GLUCAP 105* 92 75 72 73  Iron  Studies: No results for input(s): IRON , TIBC, TRANSFERRIN, FERRITIN in the last 72 hours. Lab Results  Component Value Date   INR 1.8 (H) 09/01/2024   INR 1.4 (H) 12/16/2023   INR 1.5 (H) 12/13/2023   Studies/Results: DG Chest Port 1 View Result Date: 09/08/2024 CLINICAL DATA:  Pulmonary edema. EXAM: PORTABLE CHEST 1 VIEW COMPARISON:  September 05, 2024. FINDINGS: Stable cardiomegaly. Mildly increased diffuse bilateral lung opacities are noted suggesting worsening pneumonia or edema. Bony thorax is unremarkable. IMPRESSION: Mildly increased diffuse bilateral lung opacities as described above. Electronically Signed   By: Lynwood Landy Raddle M.D.   On: 09/08/2024 17:38    Medications:  dextrose  50 mL/hr at 09/09/24 0700    amiodarone   400 mg Oral BID   Chlorhexidine  Gluconate Cloth  6 each Topical Daily   diphenhydrAMINE   25 mg Intravenous Once   feeding supplement (NEPRO CARB STEADY)  1,000 mL Per Tube Q24H   feeding supplement (NEPRO CARB STEADY)   237 mL Oral TID BM   heparin   5,000 Units Subcutaneous Q8H   midodrine   10 mg Oral TID WC   multivitamin  1 tablet Oral QHS   mouth rinse  15 mL Mouth Rinse 4 times per day   mouth rinse  15 mL Mouth Rinse 4 times per day   oxidized cellulose  1 each Topical Once   sertraline   25 mg Oral Daily    Dialysis Orders: 3h  B400   37.5kg   2K bath  AVF  Heparin  none Post 9/02 wt 38.2kg, usual comes off 0-1.5kg over Hb 12, no esa   Home bp meds: Midodrine  10 tid  Assessment/Plan: SOB/ pulm edema: went from RA to bipap after 0.5L bolus for hypotension due to cardiac compromise.  CXR shows pulm edema.  Will attempt UF with HD this morning but may not tolerate.   ESRD: on HD TTS. HD this morning.  She has end stage valvular and cardiac disease.  Will attempt HD but would not recommend using pressors or CRRT if she does not tolerate HD as this is equivalent to futile care.   Vascular Access: Noted AVF started bleeding on 9/8 after venous needle was pulled. VVS on board. A stitch, manual pressure, and dressing were applied. No issues since then. Have an experienced cannulator stick her moving forward. If problems were to re-occur she will need TDC placement at that point. Hypotension: Cardiac in origin.  midodrine  continuing here,pre and mid doses but as above no plans to escalate to pressors or CRRT Hypoglycemia: 2nd poor PO intake. 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  GOC: Pall care following and well known to patient - cont full scope but as above we are approaching a point of inability to safely provide dialysis/ultrafiltration due to end stage cardiac  disease.  Will attempt HD this AM with maneuvers to support BP but as above no pressors or CRRT.  Dw PCCM MD.   Manuelita Barters MD [facility] Pager [phone]

## 2024-09-09 NOTE — Progress Notes (Signed)
 NAME:  [name], MRN:  [id number], DOB:  Jun 01, 1981, LOS: 9 ADMISSION DATE:  08/31/2024, CONSULTATION DATE:  08/31/24 REFERRING MD: Dr. 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 Also had decreased appetite and fatigue. Denies fevers, chills. In ED, started on BiPAP. Able to transition to 6L [place]. CXR with pulmonary edema. K 5.3 BNP elevated. Normal WBC. LA 6.2>3.2. Given levaquin . SBP readings low-normal with intermittent readings with SBP in 60s and 70s but mentating well and eating a chicken wrap. PCCM and Nephrology consulted. Plan for hemodialysis in ICU.   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 HD 1.1L 9/5: HD removed 1 with levophed  9/6: HD removed 2L with levophed  9/7: Weaning pressors 9/8 moved out of ICU. Palliative care consulted. Also seen by vascular surg for bleeding of right AV fistula site. Suture placed. Pressure held. Advised against fistulogram, and advised HD cath if issues in future. On-going hypotension w/ HD noted.  9/9 -9/11 on-going supportive care on 9/12 poor po intake. No appetite, no endurance, not able to complete meal.  Started on dextrose  gtt. Called again for hypotension. Got 250 ML and placed supine BP improved.  9/12 transferred back to ICU for hypotension, hypoxia after fluid  bolus  Interim History / Subjective:  Required high bipap support overnight, FiO2 90%   Objective   Blood pressure (!) 93/38, pulse 76, temperature (!) 96.4 F (35.8 C), resp. rate (!) 24, height 5' 2 (1.575 m), weight 38.3 kg, SpO2 96%.    Vent Mode: BIPAP FiO2 (%):  [90 %-100 %] 100 % Set Rate:  [20 bmp] 20 bmp PEEP:  [8 cmH20] 8 cmH20   Intake/Output Summary (Last 24 hours) at 09/09/2024 1354 Last data filed at 09/09/2024 1230 Gross per 24 hour  Intake 3543.5 ml  Output 400 ml  Net 3143.5 ml   Filed Weights   09/08/24 2015 09/09/24 0447 09/09/24 0805  Weight: 38.8 kg 38.3 kg 38.3 kg   Physical Exam: General chronically ill appearing woman, cachectic, very frail HENT temporal wasting, bipap mask  Pulmonary Rhales bilaterally, no wheezing. Trying to talk; breathing comfortably on BiPAP. Cardio S1S2, RRR Abd soft, NT Extremities muscle wasting, no significant edema Neuro lethargic, wakes up briefly, trying to talk on bipap, moving extremities but globally weak  LA 2.7>2.6 Na+ 125 BUN 41 Cr 4.46 WBC 7.9 H/H 11.1/34.3 Platelets 111 CXR personally reviewed> bilateral diffuse increased interstitial opacities, cardiomegaly.  Resolved Hospital Problem list   Acute on chronic resp failure AGMA Hyperkalemia  Hypocalcemia  Bleeding from AVF site suture placed  Assessment & Plan:   Acute on chronic hypotension 2/2 advanced vasoplegia and advanced heart disease -midodrine  -end stage process; not going to do well long-term if she is not able to tolerate HD -does not tolerate fluid boluses due to respiratory failure  Acute on chronic biventricular heart failure w/ EF 30-35% w/ severely  reduced RV fxn, complicated by valvular heart disease including moderate to severe MR, severe TR and moderate to severe AI-  NYHA class IV She is not a candidate for surgical or interventional cardiovascular therapies due to ongoing cocaine  use, severe malnutrition, debility, and overall risk  profile.  -iHD today for volume management -not a candidate for advanced therapies, not likely to benefit from inotropes -not going to be able to tolerate GDMT due to hypotension  Chronic atrial fibrillation -not a candidate for anticoagulation due to history of subdural hematoma -intolerant to Bblocker and CCB due to hypotension -amioarone  Acute on chronic respiratory failure due to acute pulmonary edema -needs volume off  ESRD; Lupus nephritis  -iHD today -not a candidate for CRRT since this is an end stage process  IDDM with refractory hypoglycemia; proves this is an end stage process -dextrose  + thiamine  -trial of eating today   Severe protein calorie malnutrition with cachexia Physical deconditioning, generalized weaknss  -eat as able -not sure that TF will be well tolerated   End stage process. Anticipate in hospital death in the next few days. Do not think she can survive outside of an ICU due to refractory hypotension and respiratory failure. Appreciate Palliative Care's involvement.   Cc time:     This patient is critically ill with multiple organ system failure which requires frequent high complexity decision making, assessment, support, evaluation, and titration of therapies. This was completed through the application of advanced monitoring technologies and extensive interpretation of multiple databases. During this encounter critical care time was devoted to patient care services described in this note for 36 minutes.   [name] SHAUNNA Gaskins, DO 09/09/24 2:08 PM [facility]  For contact information, see Amion. If no response to pager, please call PCCM consult pager. After hours, 7PM- 7AM, please call Elink.

## 2024-09-09 NOTE — Progress Notes (Signed)
 BP recheck is 84/24

## 2024-09-09 NOTE — Progress Notes (Signed)
 OT Cancellation Note  Patient Details Name: [name] MRN: [id number] DOB: [birth year]   Cancelled Treatment:    Reason Eval/Treat Not Completed: Medical issues which prohibited therapy (per conversation with RN, pt continues to require BiPAP this morning, RN requests check back later today to see if pt appropriate. Will follow up as schedule allows.)  Malaki Koury D Walton, OTD, OTR/L [facility] Office: [phone]   Elma JONETTA Penner 09/09/2024, 8:10 AM

## 2024-09-09 NOTE — Plan of Care (Signed)
  Problem: Clinical Measurements: Goal: Ability to maintain clinical measurements within normal limits will improve Outcome: Progressing Goal: Will remain free from infection Outcome: Progressing Goal: Diagnostic test results will improve Outcome: Progressing Goal: Respiratory complications will improve Outcome: Progressing Goal: Cardiovascular complication will be avoided Outcome: Progressing   Problem: Activity: Goal: Risk for activity intolerance will decrease Outcome: Progressing   Problem: Nutrition: Goal: Adequate nutrition will be maintained Outcome: Progressing   Problem: Coping: Goal: Level of anxiety will decrease Outcome: Progressing   Problem: Elimination: Goal: Will not experience complications related to bowel motility Outcome: Progressing Goal: Will not experience complications related to urinary retention Outcome: Progressing   Problem: Pain Managment: Goal: General experience of comfort will improve and/or be controlled Outcome: Progressing   Problem: Safety: Goal: Ability to remain free from injury will improve Outcome: Progressing   Problem: Skin Integrity: Goal: Risk for impaired skin integrity will decrease Outcome: Progressing   Problem: Education: Goal: Individualized Educational Video(s) Outcome: Progressing   Problem: Fluid Volume: Goal: Compliance with measures to maintain balanced fluid volume will improve Outcome: Progressing   Problem: Health Behavior/Discharge Planning: Goal: Ability to manage health-related needs will improve Outcome: Progressing   Problem: Nutritional: Goal: Ability to make healthy dietary choices will improve Outcome: Progressing   Problem: Clinical Measurements: Goal: Complications related to the disease process, condition or treatment will be avoided or minimized Outcome: Progressing   Problem: Education: Goal: Ability to describe self-care measures that may prevent or decrease complications (Diabetes  Survival Skills Education) will improve Outcome: Progressing Goal: Individualized Educational Video(s) Outcome: Progressing   Problem: Coping: Goal: Ability to adjust to condition or change in health will improve Outcome: Progressing   Problem: Fluid Volume: Goal: Ability to maintain a balanced intake and output will improve Outcome: Progressing   Problem: Health Behavior/Discharge Planning: Goal: Ability to identify and utilize available resources and services will improve Outcome: Progressing Goal: Ability to manage health-related needs will improve Outcome: Progressing   Problem: Metabolic: Goal: Ability to maintain appropriate glucose levels will improve Outcome: Progressing   Problem: Nutritional: Goal: Maintenance of adequate nutrition will improve Outcome: Progressing Goal: Progress toward achieving an optimal weight will improve Outcome: Progressing   Problem: Skin Integrity: Goal: Risk for impaired skin integrity will decrease Outcome: Progressing   Problem: Tissue Perfusion: Goal: Adequacy of tissue perfusion will improve Outcome: Progressing

## 2024-09-09 NOTE — Progress Notes (Signed)
 Pt taken on BiPAP and placed on HHFNC due to pt refusing to continue to wear BiPAP. Pt tolerating well at this time, no increased WOB noted, SpO2 96%, RN aware.     09/09/24 1334  Therapy Vitals  Resp (!) 24  MEWS Score/Color  MEWS Score 5  MEWS Score Color Red  Respiratory Assessment  Assessment Type Assess only  Respiratory Pattern Regular;Unlabored  Chest Assessment Chest expansion symmetrical  Cough None  Bilateral Breath Sounds Diminished  Oxygen Therapy/Pulse Ox  O2 Device HHFNC  $ High Flow Nasal POS Pressure Daily Yes  $ Kit Airspiral Circuit Neb Adpt Yes  Heated High Flow Nasal Cannula  Yes  Heated High Flow Nasal Cannula  Adult Large  $ Adult Large Yes  O2 Therapy Oxygen humidified  O2 Flow Rate (L/min) 20 L/min  FiO2 (%) 100 %  SpO2 96 %

## 2024-09-09 NOTE — Progress Notes (Signed)
 RT unable to retrieve abg. Pt. Did not tolerate the procedure due to current pain in pt. Arm. RN aware.

## 2024-09-09 NOTE — Progress Notes (Signed)
 Nutrition Follow-up  DOCUMENTATION CODES:   Underweight, Severe malnutrition in context of chronic illness  INTERVENTION:   Continue Nepro Shake po TID, each supplement provides 425 kcal and 19 grams protein  Continue liberal diet   If NGT able to be placed, recommend:  Osmolite 1.2@60ml /hr- Initiate at 107ml/hr and increase by 10ml/hr q 8 hours until goal rate is reached.   Free water  flushes 30ml q4 hours to maintain tube patency   Regimen provides 1728kcal/day, 80g/day protein and 1315ml/day of free water .   Pt at high refeed risk; recommend monitor potassium, magnesium and phosphorus labs daily until stable  Rena-vit po daily  Thiamine  100mg  po daily x 7 days   Daily weights  NUTRITION DIAGNOSIS:   Severe Malnutrition related to chronic illness (ESRD, HFrEF) as evidenced by severe fat depletion, severe muscle depletion. -ongoing   GOAL:   Patient will meet greater than or equal to 90% of their needs -not met   MONITOR:   PO intake, Supplement acceptance, Labs, Weight trends, I & O's  REASON FOR ASSESSMENT:   Consult Enteral/tube feeding initiation and management  ASSESSMENT:   43 y/o female with h/o CHF, Afib (not on anticoagulation), pericardial effusion with tamponade, SLE with nephritis, ESRD on HD, substance abuse, IDA, HTN, GERD, seizures, NICM and SDH who is admitted with volume overload, end-stage heart failure and AV fistula bleed.  RD working remotely.  Pt with ongoing poor appetite and oral intake in hospital; pt documented to be eating 25% of meals. Pt currently on bipap. Recommend continue vitamins and supplements. Plan is for cortrak tube placement and nutrition support on Monday. Pt is at high refeed risk; will add thiamine . Per chart, pt appears fairly weight stable since admission. Plan is for HD today.    Medications reviewed and include: heparin , midodrine , rena-vit, 10% dextrose  @50ml /hr   Labs reviewed: Na 125(L), K 4.8 wnl, BUN 41(H),  creat 4.46(H) Cbgs- 38, 73, 72 x 24 hrs    Diet Order:   Diet Order             Diet regular Room service appropriate? Yes with Assist; Fluid consistency: Thin; Fluid restriction: 1200 mL Fluid  Diet effective now                  EDUCATION NEEDS:   Not appropriate for education at this time  Skin:  Skin Assessment: Reviewed RN Assessment  Last BM:  9/13- type 4  Height:   Ht Readings from Last 1 Encounters:  08/31/24 5' 2 (1.575 m)    Weight:   Wt Readings from Last 1 Encounters:  09/09/24 38.3 kg    Ideal Body Weight:  50 kg  BMI:  Body mass index is 15.44 kg/m.  Estimated Nutritional Needs:   Kcal:  1500-1700kcal/day  Protein:  70-80g/day  Fluid:  UOP +1L  Augustin Shams MS, RD, LDN If unable to be reached, please send secure chat to RD inpatient available from 8:00a-4:00p daily

## 2024-09-09 NOTE — Progress Notes (Addendum)
 eLink Physician-Brief Progress Note Patient Name: SHIREL MALLIS DOB: Sep 01, 1981 MRN: [id number]   Date of Service  09/09/2024  HPI/Events of Note  43 year old female with end-stage renal disease secondary to lupus nephritis, A-fib/flutter not on anticoagulation due to subdural hematoma, chronic biventricular failure and severe malnutrition initially presented with dyspnea in the setting of volume overload and inability to tolerate HD secondary to chronic hypotension.  Extremely limited IV access.  Notified that the patient had a drop in blood pressure after administration of fentanyl .  BP dropped to 55/26 and recovered somewhat to 82/18  Per Nephrology no plans to escalate to pressors or CRRT.  Reviewed primary team's note that the patient has chronic hypotension with inability to tolerate IHD with no anticipated plan to initiate vasopressors due to futility.  Unlikely to benefit from inotropic despite heart failure due to complications of ongoing cocaine  use.  eICU Interventions  Unfortunately, the patient continues to decline clinically.  Per multidisciplinary team, aggressive therapy seems to be futile.  Hold vasopressors for now.  Fortunately, mentation stable.   2242 - replace Fentanyl  with Oxycodone  sliding scale   Intervention Category Major Interventions: Hypotension - evaluation and management  Dariush Mcnellis 09/09/2024, 8:24 PM

## 2024-09-10 DIAGNOSIS — I5082 Biventricular heart failure: Secondary | ICD-10-CM | POA: Diagnosis not present

## 2024-09-10 DIAGNOSIS — I4891 Unspecified atrial fibrillation: Secondary | ICD-10-CM | POA: Diagnosis not present

## 2024-09-10 DIAGNOSIS — E43 Unspecified severe protein-calorie malnutrition: Secondary | ICD-10-CM | POA: Diagnosis not present

## 2024-09-10 DIAGNOSIS — I959 Hypotension, unspecified: Secondary | ICD-10-CM | POA: Diagnosis not present

## 2024-09-10 LAB — BASIC METABOLIC PANEL WITH GFR
Anion gap: 13 (ref 5–15)
BUN: 36 mg/dL — ABNORMAL HIGH (ref 6–20)
CO2: 22 mmol/L (ref 22–32)
Calcium: 8.8 mg/dL — ABNORMAL LOW (ref 8.9–10.3)
Chloride: 90 mmol/L — ABNORMAL LOW (ref 98–111)
Creatinine, Ser: 3.6 mg/dL — ABNORMAL HIGH (ref 0.44–1.00)
GFR, Estimated: 16 mL/min — ABNORMAL LOW (ref >60)
Glucose, Bld: 95 mg/dL (ref 70–99)
Potassium: 4.1 mmol/L (ref 3.5–5.1)
Sodium: 125 mmol/L — ABNORMAL LOW (ref 135–145)

## 2024-09-10 LAB — CBC
HCT: 29.9 % — ABNORMAL LOW (ref 36.0–46.0)
Hemoglobin: 9.8 g/dL — ABNORMAL LOW (ref 12.0–15.0)
MCH: 28.4 pg (ref 26.0–34.0)
MCHC: 32.8 g/dL (ref 30.0–36.0)
MCV: 86.7 fL (ref 80.0–100.0)
Platelets: 131 K/uL — ABNORMAL LOW (ref 150–400)
RBC: 3.45 MIL/uL — ABNORMAL LOW (ref 3.87–5.11)
RDW: 19.6 % — ABNORMAL HIGH (ref 11.5–15.5)
WBC: 11.1 K/uL — ABNORMAL HIGH (ref 4.0–10.5)
nRBC: 0.7 % — ABNORMAL HIGH (ref 0.0–0.2)

## 2024-09-10 LAB — GLUCOSE, CAPILLARY
Glucose-Capillary: 106 mg/dL — ABNORMAL HIGH (ref 70–99)
Glucose-Capillary: 26 mg/dL — CL (ref 70–99)
Glucose-Capillary: 37 mg/dL — CL (ref 70–99)
Glucose-Capillary: 44 mg/dL — CL (ref 70–99)
Glucose-Capillary: 65 mg/dL — ABNORMAL LOW (ref 70–99)
Glucose-Capillary: 66 mg/dL — ABNORMAL LOW (ref 70–99)
Glucose-Capillary: 70 mg/dL (ref 70–99)
Glucose-Capillary: 73 mg/dL (ref 70–99)
Glucose-Capillary: 74 mg/dL (ref 70–99)
Glucose-Capillary: 74 mg/dL (ref 70–99)
Glucose-Capillary: 78 mg/dL (ref 70–99)
Glucose-Capillary: <10 mg/dL — CL (ref 70–99)

## 2024-09-10 LAB — LACTIC ACID, PLASMA
Lactic Acid, Venous: 3.4 mmol/L (ref 0.5–1.9)
Lactic Acid, Venous: 2.8 mmol/L (ref 0.5–1.9)

## 2024-09-10 LAB — PHOSPHORUS: Phosphorus: 5.1 mg/dL — ABNORMAL HIGH (ref 2.5–4.6)

## 2024-09-10 LAB — MAGNESIUM: Magnesium: 2.3 mg/dL (ref 1.7–2.4)

## 2024-09-10 MED ORDER — DEXTROSE 50 % IV SOLN
INTRAVENOUS | Status: AC
  Filled 2024-09-10: qty 50

## 2024-09-10 MED ORDER — MUPIROCIN 2 % EX OINT
1.0000 | TOPICAL_OINTMENT | Freq: Two times a day (BID) | CUTANEOUS | Status: AC
  Administered 2024-09-10 – 2024-09-15 (×10): 1 via NASAL
  Filled 2024-09-10 (×2): qty 22

## 2024-09-10 MED ORDER — DEXTROSE 50 % IV SOLN
1.0000 | Freq: Once | INTRAVENOUS | Status: AC
Start: 2024-09-10 — End: 2024-09-10

## 2024-09-10 MED ORDER — MIDODRINE HCL 5 MG PO TABS
15.0000 mg | ORAL_TABLET | Freq: Three times a day (TID) | ORAL | Status: DC
  Administered 2024-09-10 – 2024-09-16 (×16): 15 mg via ORAL
  Filled 2024-09-10 (×17): qty 3

## 2024-09-10 MED ORDER — SEVELAMER CARBONATE 800 MG PO TABS
3200.0000 mg | ORAL_TABLET | Freq: Three times a day (TID) | ORAL | Status: DC
  Administered 2024-09-11: 3200 mg via ORAL
  Filled 2024-09-10 (×2): qty 4

## 2024-09-10 MED ORDER — DEXTROSE 50 % IV SOLN
25.0000 g | INTRAVENOUS | Status: AC
  Administered 2024-09-10: 25 g via INTRAVENOUS

## 2024-09-10 NOTE — Progress Notes (Signed)
 NAME:  [name], MRN:  [id number], DOB:  1981/06/16, LOS: 10 ADMISSION DATE:  08/31/2024, CONSULTATION DATE:  08/31/24 REFERRING MD: Dr. 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 Also had decreased appetite and fatigue. Denies fevers, chills. In ED, started on BiPAP. Able to transition to 6L [place]. CXR with pulmonary edema. K 5.3 BNP elevated. Normal WBC. LA 6.2>3.2. Given levaquin . SBP readings low-normal with intermittent readings with SBP in 60s and 70s but mentating well and eating a chicken wrap. PCCM and Nephrology consulted. Plan for hemodialysis in ICU.   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 HD 1.1L 9/5: HD removed 1 with levophed  9/6: HD removed 2L with levophed  9/7: Weaning pressors 9/8 moved out of ICU. Palliative care consulted. Also seen by vascular surg for bleeding of right AV fistula site. Suture placed. Pressure held. Advised against fistulogram, and advised HD cath if issues in future. On-going hypotension w/ HD noted.  9/9 -9/11 on-going supportive care on 9/12 poor po intake. No appetite, no endurance, not able to complete meal.  Started on dextrose  gtt. Called again for hypotension. Got 250 ML and placed supine BP improved.  9/12 transferred back to ICU for hypotension, hypoxia after fluid  bolus with very high BiPAP settings 9/13 iHD, able to come off bipap  Interim History / Subjective:  Back on [place], feels groggy today. Denies complaints.   Objective   Blood pressure (!) 87/53, pulse 77, temperature 97.7 F (36.5 C), temperature source Oral, resp. rate (!) 34, height 5' 2 (1.575 m), weight 41.4 kg, SpO2 95%.    FiO2 (%):  [90 %-100 %] 100 % PEEP:  [8 cmH20] 8 cmH20   Intake/Output Summary (Last 24 hours) at 09/10/2024 0948 Last data filed at 09/10/2024 0900 Gross per 24 hour  Intake 1966.01 ml  Output 400 ml  Net 1566.01 ml   Filed Weights   09/09/24 0447 09/09/24 0805 09/10/24 0500  Weight: 38.3 kg 38.3 kg 41.4 kg   Physical Exam: General chronically ill appearing woman lying in bed in NAD, cachectic HENT temporal wasting Pulmonary breathing comfortably on [place], lying flat in bed. Minimal rhales, no wheezing. No accessory muscle use.  Cardio S1S2, RRR Abd soft, NT Extremities muscle wasting, fistula in RUE Neuro more alert today, answering questions appropriately.   LA 3.4 Na+ 125 BUN 36 Cr 3.6 WBC 11.1 H/H 9.8/29.9 Platelets 131  Resolved Hospital Problem list   Acute on chronic resp failure AGMA Hyperkalemia  Hypocalcemia  Bleeding from AVF site suture placed  Assessment & Plan:   Acute on chronic hypotension 2/2 advanced vasoplegia and advanced heart disease> improved today -con't midodrine  -this is likely an end stage process and will not be survivable if she is unable to tolerate iHD; not convinced there is anything reversible here  Acute on chronic biventricular heart failure w/ EF 30-35% w/ severely reduced RV  fxn, complicated by valvular heart disease including moderate to severe MR, severe TR and moderate to severe AI-  NYHA class IV She is not a candidate for surgical or interventional cardiovascular therapies due to ongoing cocaine  use, severe malnutrition, debility, and overall risk profile.  -iHD for volume management -not a candidate  for advanced therapies due to severe malnutrition and debility, overall poor health -not likely to benefit from inotropes -intolerant to GDMT due to hypotension  Chronic atrial fibrillation -not a candidate for [facility] due to chronic subdural hematoma -amiodarone ; not tolerant to Bblocker due to hypotension, CCB C/I with low EF  Acute on chronic respiratory failure due to acute pulmonary edema -needs volume off  ESRD; Lupus nephritis  -iHD -not a CRRT candidate if this is an end stage process  Refractory hypoglycemia> stable for right now. Although listed in previous CCM notes, she does not have a history of DM, being on insulin , or DKA (patient and family at bedside verified this). -encourage PO intake as able -accuchecks + PRN d50w -she understands that refractory hypoglycemia is an indication of liver failure and for her a manifestation of progressive heart failure   Severe protein calorie malnutrition with cachexia Physical deconditioning, generalized weaknss  -eat as able -not sure that TF will be well tolerated  LUE wound- present on admission -wound care   End stage process. Anticipate in hospital death, but she tolerated iHD yesterday better than expect. Long-term her prognosis remains very poor due to chronic MOF, cachexia, debility.  Unlikely she can survive outside of an ICU due to refractory hypotension and respiratory failure. Would assess iHD in the unit again tomorrow due to severely reduced BP yesterday before HD. Not sure she can really survive outside an ICU for long at this point based on her recent experience. Appreciate Palliative Care and Nephrology's involvement.   Cc time:     This patient is critically ill with multiple organ system failure which requires frequent high complexity decision making, assessment, support, evaluation, and titration of therapies. This was completed through the application of advanced monitoring technologies and extensive interpretation  of multiple databases. During this encounter critical care time was devoted to patient care services described in this note for 34 minutes.   [name] SHAUNNA Gaskins, DO 09/10/24 10:18 AM [facility]  For contact information, see Amion. If no response to pager, please call PCCM consult pager. After hours, 7PM- 7AM, please call Elink.

## 2024-09-10 NOTE — Progress Notes (Signed)
 [facility] Progress Note   Subjective:    Tolerated 0.4L UF with HD with BP in the 70s during treatment with midodrine  support.  This allowed discontinuation of bipap and she's on heated high flow 6L currently.  Rec'd fentanyl  overnight and BP dropped to 50s but recovered to 80s.  Lactate 3.4 this AM. This AM she has no new complaints.  She wishes to continue all current care.    Objective Vitals:   09/10/24 0615 09/10/24 0630 09/10/24 0645 09/10/24 0700  BP:  (!) 145/94    Pulse: 88  75 70  Resp: (!) 32 19 20 (!) 23  Temp:      TempSrc:      SpO2:   90% 93%  Weight:      Height:       Physical Exam General: Awake, alert, NAD, cachectic, ill-appearing, on HFNC Heart: RRR; No MRGs Lungs: normal WOB on HFNC Abdomen: Soft and non-tender Extremities: No LE edema Dialysis Access: AVF +t/b - weak but present  Filed Weights   09/09/24 0447 09/09/24 0805 09/10/24 0500  Weight: 38.3 kg 38.3 kg 41.4 kg    Intake/Output Summary (Last 24 hours) at 09/10/2024 0740 Last data filed at 09/10/2024 0600 Gross per 24 hour  Intake 1866.11 ml  Output 400 ml  Net 1466.11 ml    Additional Objective Labs: Basic Metabolic Panel: Recent Labs  Lab 09/05/24 1410 09/06/24 0332 09/08/24 1724 09/10/24 0322  NA 127* 132* 125* 125*  K 4.1 4.2 4.8 4.1  CL 88* 93* 87* 90*  CO2 24 25 21* 22  GLUCOSE 126* 91 93 95  BUN 47* 30* 41* 36*  CREATININE 5.04* 3.78* 4.46* 3.60*  CALCIUM  9.3 9.4 8.9 8.8*  PHOS 5.5* 4.3  --  5.1*   Liver Function Tests: Recent Labs  Lab 09/05/24 1410 09/06/24 0332 09/08/24 1724  AST  --   --  42*  ALT  --   --  19  ALKPHOS  --   --  317*  BILITOT  --   --  2.1*  PROT  --   --  7.6  ALBUMIN  2.3* 2.4* 2.3*   No results for input(s): LIPASE, AMYLASE in the last 168 hours. CBC: Recent Labs  Lab 09/05/24 1410 09/06/24 0332 09/08/24 1724 09/10/24 0322  WBC 8.8 8.5 7.9 11.1*  NEUTROABS  --   --  5.5  --   HGB 11.7* 11.9* 11.1* 9.8*   HCT 35.2* 36.0 34.3* 29.9*  MCV 83.8 84.3 85.5 86.7  PLT 159 130* 111* 131*   Blood Culture    Component Value Date/Time   SDES BLOOD SITE NOT SPECIFIED 08/31/2024 1223   SDES BLOOD SITE NOT SPECIFIED 08/31/2024 1223   SPECREQUEST  08/31/2024 1223    BOTTLES DRAWN AEROBIC ONLY Blood Culture results may not be optimal due to an inadequate volume of blood received in culture bottles   SPECREQUEST  08/31/2024 1223    BOTTLES DRAWN AEROBIC ONLY Blood Culture adequate volume   CULT  08/31/2024 1223    NO GROWTH 5 DAYS Performed at [facility], [address] [place], [place] [address]    CULT  08/31/2024 1223    NO GROWTH 5 DAYS Performed at [facility], [address], [place], [place] [address]    REPTSTATUS 09/05/2024 FINAL 08/31/2024 1223   REPTSTATUS 09/05/2024 FINAL 08/31/2024 1223    Cardiac Enzymes: No results for input(s): CKTOTAL, CKMB, CKMBINDEX, TROPONINI in the last 168  hours. CBG: Recent Labs  Lab 09/09/24 1126 09/09/24 1620 09/09/24 2011 09/09/24 2351 09/10/24 0317  GLUCAP 93 120* 98 106* 73   Iron  Studies: No results for input(s): IRON , TIBC, TRANSFERRIN, FERRITIN in the last 72 hours. Lab Results  Component Value Date   INR 1.8 (H) 09/01/2024   INR 1.4 (H) 12/16/2023   INR 1.5 (H) 12/13/2023   Studies/Results: DG Chest Port 1 View Result Date: 09/08/2024 CLINICAL DATA:  Pulmonary edema. EXAM: PORTABLE CHEST 1 VIEW COMPARISON:  September 05, 2024. FINDINGS: Stable cardiomegaly. Mildly increased diffuse bilateral lung opacities are noted suggesting worsening pneumonia or edema. Bony thorax is unremarkable. IMPRESSION: Mildly increased diffuse bilateral lung opacities as described above. Electronically Signed   By: Lynwood Landy Raddle M.D.   On: 09/08/2024 17:38    Medications:  dextrose  50 mL/hr at 09/10/24 9380    amiodarone   400 mg Oral BID   Chlorhexidine  Gluconate Cloth  6 each Topical Daily   diphenhydrAMINE   25 mg  Intravenous Once   feeding supplement (NEPRO CARB STEADY)  237 mL Oral TID BM   heparin   5,000 Units Subcutaneous Q8H   midodrine   10 mg Oral TID WC   multivitamin  1 tablet Oral QHS   mouth rinse  15 mL Mouth Rinse 4 times per day   mouth rinse  15 mL Mouth Rinse 4 times per day   oxidized cellulose  1 each Topical Once   sertraline   25 mg Oral Daily   thiamine   100 mg Oral Daily    Dialysis Orders: 3h  B400   37.5kg   2K bath  AVF  Heparin  none Post 9/02 wt 38.2kg, usual comes off 0-1.5kg over Hb 12, no esa   Home bp meds: Midodrine  10 tid  Assessment/Plan: SOB/ pulm edema: razor thin margin between hypo and hypervolemia in setting of end stage cardiac disease.  Went from RA to bipap with 0.5L bolus then to HFNC with 0.4L UF with HD yesterday.  Plan for HD with UF tomorrow as tolerated.   ESRD: on HD TTS. Urgent HD Sat AM per above - tolerated 0.4L UF.  Will perform ongoing HD but would not use pressors or CRRT if she does not tolerate HD as this is equivalent to futile care.  This AM she consents to ongoing attempts at HD but is well informed of the issues encountered with hypotension secondary to cardiac compromise.  At some point, likely soon, I think she will no longer be able to tolerate HD and she has been informed of this.   Vascular Access: Noted AVF started bleeding on 9/8 after venous needle was pulled. VVS on board. A stitch, manual pressure, and dressing were applied. No issues since then. Have an experienced cannulator stick her moving forward. Catheter would be next access. Hypotension: Cardiac in origin.  midodrine  continuing here,pre and mid doses but as above no plans to escalate to pressors or CRRT Hypoglycemia: 2nd poor PO intake. 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  GOC: Pall care following and well known to patient - cont full scope but as above we are approaching a point of inability  to safely provide dialysis/ultrafiltration due to end stage cardiac disease.     D/w ICU RN  Manuelita Barters MD [facility] Pager [phone]

## 2024-09-10 NOTE — Plan of Care (Addendum)
 This patient remains on [facility] as of time of writing. MRSA positive standing orders initiated overnight by this RN per protocol. Plan for HD for 09/11/24. The patient continues to receive D10W at 50 mL/hr via REJ PIV. Q 4 hours CBGs. Supplemental O2 via HFNC @ 11 L/min.   Edit: MN: CBGs x 3 read out as low (<10 mg/dl). Call placed to [facility]-Link and verbal order received for BMP x 1 STAT to verify glucose level. I do not have confidence the capillary glucose levels are accurate given the patient's poor perfusion. Awaiting results from serum glucose level.   Edit: 0030 hours: Phlebotomy unsuccessful x 1 for BMP draw. A drop of blood sampled after the venipuncture read out as 51 mg/dL on the glucometer. Elink RN updated. Awaiting new orders. 2nd phlebotomist to try again for BMP.   Edit: 0045 hours: Callback received from Dr. Haze; see new orders. D10W infusion increased, 1 amp D50W x 1 given. Patient was also placed on BiPAP by RT at this time given poor perfusion and drowsiness.  Edit: 0135 hours: Critical value received from lab: Sodium = 118. E-Link RN Jody notified at this time. Plan remains for HD in the AM.   Edit 0440 hours: CBG low at 64 mg/dL. E-Link RN notified. 1/2 AMP D50W given per protocol. Recheck = 118 mg/dL.    Problem: Clinical Measurements: Goal: Ability to maintain clinical measurements within normal limits will improve 09/10/2024 1951 by Teresa Olita RAMAN, RN Outcome: Not Progressing 09/10/2024 1951 by Teresa Olita RAMAN, RN Outcome: Not Progressing Goal: Will remain free from infection 09/10/2024 1951 by Teresa Olita RAMAN, RN Outcome: Not Progressing 09/10/2024 1951 by Teresa Olita RAMAN, RN Outcome: Not Progressing Goal: Diagnostic test results will improve 09/10/2024 1951 by Teresa Olita RAMAN, RN Outcome: Not Progressing 09/10/2024 1951 by Teresa Olita RAMAN, RN Outcome: Not Progressing Goal: Respiratory complications will improve 09/10/2024 1951 by Teresa Olita RAMAN, RN Outcome: Not  Progressing 09/10/2024 1951 by Teresa Olita RAMAN, RN Outcome: Not Progressing Goal: Cardiovascular complication will be avoided 09/10/2024 1951 by Teresa Olita RAMAN, RN Outcome: Not Progressing 09/10/2024 1951 by Teresa Olita RAMAN, RN Outcome: Not Progressing   Problem: Activity: Goal: Risk for activity intolerance will decrease 09/10/2024 1951 by Teresa Olita RAMAN, RN Outcome: Not Progressing 09/10/2024 1951 by Teresa Olita RAMAN, RN Outcome: Not Progressing   Problem: Nutrition: Goal: Adequate nutrition will be maintained 09/10/2024 1951 by Teresa Olita RAMAN, RN Outcome: Not Progressing 09/10/2024 1951 by Teresa Olita RAMAN, RN Outcome: Not Progressing   Problem: Coping: Goal: Level of anxiety will decrease 09/10/2024 1951 by Teresa Olita RAMAN, RN Outcome: Not Progressing 09/10/2024 1951 by Teresa Olita RAMAN, RN Outcome: Not Progressing   Problem: Elimination: Goal: Will not experience complications related to bowel motility 09/10/2024 1951 by Teresa Olita RAMAN, RN Outcome: Not Progressing 09/10/2024 1951 by Teresa Olita RAMAN, RN Outcome: Not Progressing Goal: Will not experience complications related to urinary retention 09/10/2024 1951 by Teresa Olita RAMAN, RN Outcome: Not Progressing 09/10/2024 1951 by Teresa Olita RAMAN, RN Outcome: Not Progressing   Problem: Pain Managment: Goal: General experience of comfort will improve and/or be controlled 09/10/2024 1951 by Teresa Olita RAMAN, RN Outcome: Not Progressing 09/10/2024 1951 by Teresa Olita RAMAN, RN Outcome: Not Progressing   Problem: Safety: Goal: Ability to remain free from injury will improve 09/10/2024 1951 by Teresa Olita RAMAN, RN Outcome: Not Progressing 09/10/2024 1951 by Teresa Olita RAMAN, RN Outcome: Not Progressing   Problem: Skin Integrity: Goal: Risk for impaired  skin integrity will decrease 09/10/2024 1951 by Teresa Olita RAMAN, RN Outcome: Not Progressing 09/10/2024 1951 by Teresa Olita RAMAN, RN Outcome: Not Progressing   Problem:  Education: Goal: Individualized Educational Video(s) 09/10/2024 1951 by Teresa Olita RAMAN, RN Outcome: Not Progressing 09/10/2024 1951 by Teresa Olita RAMAN, RN Outcome: Not Progressing   Problem: Fluid Volume: Goal: Compliance with measures to maintain balanced fluid volume will improve 09/10/2024 1951 by Teresa Olita RAMAN, RN Outcome: Not Progressing 09/10/2024 1951 by Teresa Olita RAMAN, RN Outcome: Not Progressing   Problem: Health Behavior/Discharge Planning: Goal: Ability to manage health-related needs will improve 09/10/2024 1951 by Teresa Olita RAMAN, RN Outcome: Not Progressing 09/10/2024 1951 by Teresa Olita RAMAN, RN Outcome: Not Progressing   Problem: Nutritional: Goal: Ability to make healthy dietary choices will improve 09/10/2024 1951 by Teresa Olita RAMAN, RN Outcome: Not Progressing 09/10/2024 1951 by Teresa Olita RAMAN, RN Outcome: Not Progressing   Problem: Clinical Measurements: Goal: Complications related to the disease process, condition or treatment will be avoided or minimized 09/10/2024 1951 by Teresa Olita RAMAN, RN Outcome: Not Progressing 09/10/2024 1951 by Teresa Olita RAMAN, RN Outcome: Not Progressing   Problem: Education: Goal: Ability to describe self-care measures that may prevent or decrease complications (Diabetes Survival Skills Education) will improve 09/10/2024 1951 by Teresa Olita RAMAN, RN Outcome: Not Progressing 09/10/2024 1951 by Teresa Olita RAMAN, RN Outcome: Not Progressing Goal: Individualized Educational Video(s) 09/10/2024 1951 by Teresa Olita RAMAN, RN Outcome: Not Progressing 09/10/2024 1951 by Teresa Olita RAMAN, RN Outcome: Not Progressing   Problem: Coping: Goal: Ability to adjust to condition or change in health will improve 09/10/2024 1951 by Teresa Olita RAMAN, RN Outcome: Not Progressing 09/10/2024 1951 by Teresa Olita RAMAN, RN Outcome: Not Progressing   Problem: Fluid Volume: Goal: Ability to maintain a balanced intake and output will improve 09/10/2024 1951 by  Teresa Olita RAMAN, RN Outcome: Not Progressing 09/10/2024 1951 by Teresa Olita RAMAN, RN Outcome: Not Progressing   Problem: Health Behavior/Discharge Planning: Goal: Ability to identify and utilize available resources and services will improve 09/10/2024 1951 by Teresa Olita RAMAN, RN Outcome: Not Progressing 09/10/2024 1951 by Teresa Olita RAMAN, RN Outcome: Not Progressing Goal: Ability to manage health-related needs will improve 09/10/2024 1951 by Teresa Olita RAMAN, RN Outcome: Not Progressing 09/10/2024 1951 by Teresa Olita RAMAN, RN Outcome: Not Progressing   Problem: Metabolic: Goal: Ability to maintain appropriate glucose levels will improve 09/10/2024 1951 by Teresa Olita RAMAN, RN Outcome: Not Progressing 09/10/2024 1951 by Teresa Olita RAMAN, RN Outcome: Not Progressing   Problem: Nutritional: Goal: Maintenance of adequate nutrition will improve 09/10/2024 1951 by Teresa Olita RAMAN, RN Outcome: Not Progressing 09/10/2024 1951 by Teresa Olita RAMAN, RN Outcome: Not Progressing Goal: Progress toward achieving an optimal weight will improve 09/10/2024 1951 by Teresa Olita RAMAN, RN Outcome: Not Progressing 09/10/2024 1951 by Teresa Olita RAMAN, RN Outcome: Not Progressing   Problem: Skin Integrity: Goal: Risk for impaired skin integrity will decrease 09/10/2024 1951 by Teresa Olita RAMAN, RN Outcome: Not Progressing 09/10/2024 1951 by Teresa Olita RAMAN, RN Outcome: Not Progressing   Problem: Tissue Perfusion: Goal: Adequacy of tissue perfusion will improve 09/10/2024 1951 by Teresa Olita RAMAN, RN Outcome: Not Progressing 09/10/2024 1951 by Teresa Olita RAMAN, RN Outcome: Not Progressing   Problem: Education: Goal: Ability to demonstrate management of disease process will improve Outcome: Not Progressing Goal: Ability to verbalize understanding of medication therapies will improve Outcome: Not Progressing Goal: Individualized Educational Video(s) Outcome: Not Progressing   Problem: Activity:  Goal: Capacity  to carry out activities will improve Outcome: Not Progressing   Problem: Cardiac: Goal: Ability to achieve and maintain adequate cardiopulmonary perfusion will improve Outcome: Not Progressing

## 2024-09-11 DIAGNOSIS — E162 Hypoglycemia, unspecified: Secondary | ICD-10-CM | POA: Diagnosis not present

## 2024-09-11 DIAGNOSIS — J810 Acute pulmonary edema: Secondary | ICD-10-CM | POA: Diagnosis not present

## 2024-09-11 DIAGNOSIS — I5082 Biventricular heart failure: Secondary | ICD-10-CM | POA: Diagnosis not present

## 2024-09-11 DIAGNOSIS — N186 End stage renal disease: Secondary | ICD-10-CM | POA: Diagnosis not present

## 2024-09-11 DIAGNOSIS — I959 Hypotension, unspecified: Secondary | ICD-10-CM | POA: Diagnosis not present

## 2024-09-11 LAB — BASIC METABOLIC PANEL WITH GFR
Anion gap: 20 — ABNORMAL HIGH (ref 5–15)
Anion gap: 15 (ref 5–15)
BUN: 45 mg/dL — ABNORMAL HIGH (ref 6–20)
BUN: 49 mg/dL — ABNORMAL HIGH (ref 6–20)
CO2: 15 mmol/L — ABNORMAL LOW (ref 22–32)
CO2: 20 mmol/L — ABNORMAL LOW (ref 22–32)
Calcium: 8.5 mg/dL — ABNORMAL LOW (ref 8.9–10.3)
Calcium: 8.7 mg/dL — ABNORMAL LOW (ref 8.9–10.3)
Chloride: 83 mmol/L — ABNORMAL LOW (ref 98–111)
Chloride: 86 mmol/L — ABNORMAL LOW (ref 98–111)
Creatinine, Ser: 4.32 mg/dL — ABNORMAL HIGH (ref 0.44–1.00)
Creatinine, Ser: 4.43 mg/dL — ABNORMAL HIGH (ref 0.44–1.00)
GFR, Estimated: 13 mL/min — ABNORMAL LOW (ref >60)
GFR, Estimated: 12 mL/min — ABNORMAL LOW (ref >60)
Glucose, Bld: 54 mg/dL — ABNORMAL LOW (ref 70–99)
Glucose, Bld: 71 mg/dL (ref 70–99)
Potassium: 5.6 mmol/L — ABNORMAL HIGH (ref 3.5–5.1)
Potassium: 4.6 mmol/L (ref 3.5–5.1)
Sodium: 118 mmol/L — CL (ref 135–145)
Sodium: 121 mmol/L — ABNORMAL LOW (ref 135–145)

## 2024-09-11 LAB — POCT I-STAT 7, (LYTES, BLD GAS, ICA,H+H)
Acid-base deficit: 3 mmol/L — ABNORMAL HIGH (ref 0.0–2.0)
Acid-base deficit: 4 mmol/L — ABNORMAL HIGH (ref 0.0–2.0)
Bicarbonate: 22.7 mmol/L (ref 20.0–28.0)
Bicarbonate: 21.6 mmol/L (ref 20.0–28.0)
Calcium, Ion: 1.18 mmol/L (ref 1.15–1.40)
Calcium, Ion: 1.11 mmol/L — ABNORMAL LOW (ref 1.15–1.40)
HCT: 36 % (ref 36.0–46.0)
HCT: 36 % (ref 36.0–46.0)
Hemoglobin: 12.2 g/dL (ref 12.0–15.0)
Hemoglobin: 12.2 g/dL (ref 12.0–15.0)
O2 Saturation: 99 %
O2 Saturation: 95 %
Patient temperature: 36.9
Patient temperature: 97.5
Potassium: 4.3 mmol/L (ref 3.5–5.1)
Potassium: 4.9 mmol/L (ref 3.5–5.1)
Sodium: 120 mmol/L — ABNORMAL LOW (ref 135–145)
Sodium: 122 mmol/L — ABNORMAL LOW (ref 135–145)
TCO2: 24 mmol/L (ref 22–32)
TCO2: 23 mmol/L (ref 22–32)
pCO2 arterial: 43.3 mmHg (ref 32–48)
pCO2 arterial: 39.7 mmHg (ref 32–48)
pH, Arterial: 7.327 — ABNORMAL LOW (ref 7.35–7.45)
pH, Arterial: 7.342 — ABNORMAL LOW (ref 7.35–7.45)
pO2, Arterial: 144 mmHg — ABNORMAL HIGH (ref 83–108)
pO2, Arterial: 77 mmHg — ABNORMAL LOW (ref 83–108)

## 2024-09-11 LAB — MAGNESIUM: Magnesium: 2.2 mg/dL (ref 1.7–2.4)

## 2024-09-11 LAB — CORTISOL: Cortisol, Plasma: 19.1 ug/dL

## 2024-09-11 LAB — GLUCOSE, CAPILLARY
Glucose-Capillary: 108 mg/dL — ABNORMAL HIGH (ref 70–99)
Glucose-Capillary: 109 mg/dL — ABNORMAL HIGH (ref 70–99)
Glucose-Capillary: 111 mg/dL — ABNORMAL HIGH (ref 70–99)
Glucose-Capillary: 118 mg/dL — ABNORMAL HIGH (ref 70–99)
Glucose-Capillary: 144 mg/dL — ABNORMAL HIGH (ref 70–99)
Glucose-Capillary: 148 mg/dL — ABNORMAL HIGH (ref 70–99)
Glucose-Capillary: 185 mg/dL — ABNORMAL HIGH (ref 70–99)
Glucose-Capillary: 30 mg/dL — CL (ref 70–99)
Glucose-Capillary: 36 mg/dL — CL (ref 70–99)
Glucose-Capillary: 51 mg/dL — ABNORMAL LOW (ref 70–99)
Glucose-Capillary: 62 mg/dL — ABNORMAL LOW (ref 70–99)
Glucose-Capillary: 64 mg/dL — ABNORMAL LOW (ref 70–99)
Glucose-Capillary: 85 mg/dL (ref 70–99)
Glucose-Capillary: 95 mg/dL (ref 70–99)

## 2024-09-11 LAB — PHOSPHORUS: Phosphorus: 5.4 mg/dL — ABNORMAL HIGH (ref 2.5–4.6)

## 2024-09-11 MED ORDER — ORAL CARE MOUTH RINSE
15.0000 mL | OROMUCOSAL | Status: DC
  Administered 2024-09-12 – 2024-09-21 (×22): 15 mL via OROMUCOSAL

## 2024-09-11 MED ORDER — ORAL CARE MOUTH RINSE: 15.0000 mL | OROMUCOSAL | Status: DC | PRN

## 2024-09-11 MED ORDER — DEXTROSE 50 % IV SOLN
25.0000 g | INTRAVENOUS | Status: AC
  Administered 2024-09-11: 25 g via INTRAVENOUS
  Filled 2024-09-11: qty 50

## 2024-09-11 MED ORDER — DEXTROSE-SODIUM CHLORIDE 10-0.45 % IV SOLN
INTRAVENOUS | Status: DC
  Filled 2024-09-11 (×3): qty 1000

## 2024-09-11 MED ORDER — THIAMINE HCL 100 MG/ML IJ SOLN
100.0000 mg | Freq: Once | INTRAMUSCULAR | Status: AC
  Administered 2024-09-11: 100 mg via INTRAVENOUS
  Filled 2024-09-11: qty 2

## 2024-09-11 MED ORDER — FOLIC ACID 1 MG PO TABS
1.0000 mg | ORAL_TABLET | Freq: Every day | ORAL | Status: DC
  Administered 2024-09-11 – 2024-09-15 (×5): 1 mg via ORAL
  Filled 2024-09-11 (×5): qty 1

## 2024-09-11 MED ORDER — HYDROCORTISONE SOD SUC (PF) 100 MG IJ SOLR
INTRAMUSCULAR | Status: AC
  Administered 2024-09-11: 100 mg via INTRAVENOUS
  Filled 2024-09-11: qty 2

## 2024-09-11 MED ORDER — DEXTROSE IN LACTATED RINGERS 5 % IV SOLN: INTRAVENOUS | Status: DC

## 2024-09-11 MED ORDER — PROSOURCE TF20 ENFIT COMPATIBL EN LIQD
60.0000 mL | Freq: Three times a day (TID) | ENTERAL | Status: DC
  Administered 2024-09-11 – 2024-09-20 (×26): 60 mL
  Filled 2024-09-11 (×26): qty 60

## 2024-09-11 MED ORDER — DEXTROSE 50 % IV SOLN
50.0000 mL | Freq: Once | INTRAVENOUS | Status: AC
  Administered 2024-09-11: 50 mL via INTRAVENOUS

## 2024-09-11 MED ORDER — HYDROCORTISONE SOD SUC (PF) 100 MG IJ SOLR
100.0000 mg | Freq: Two times a day (BID) | INTRAMUSCULAR | Status: DC
  Administered 2024-09-11 – 2024-09-16 (×11): 100 mg via INTRAVENOUS
  Filled 2024-09-11 (×11): qty 2

## 2024-09-11 MED ORDER — AMIODARONE HCL 200 MG PO TABS
200.0000 mg | ORAL_TABLET | Freq: Two times a day (BID) | ORAL | Status: DC
  Administered 2024-09-11 – 2024-09-15 (×9): 200 mg via ORAL
  Filled 2024-09-11 (×9): qty 1

## 2024-09-11 MED ORDER — VITAL AF 1.2 CAL PO LIQD
1000.0000 mL | ORAL | Status: DC
  Administered 2024-09-11 – 2024-09-12 (×2): 1000 mL

## 2024-09-11 MED ORDER — PROSOURCE TF20 ENFIT COMPATIBL EN LIQD: 60.0000 mL | Freq: Every day | ENTERAL | Status: DC

## 2024-09-11 MED ORDER — DEXTROSE 50 % IV SOLN
INTRAVENOUS | Status: AC
  Filled 2024-09-11: qty 50

## 2024-09-11 MED ORDER — THIAMINE MONONITRATE 100 MG PO TABS
100.0000 mg | ORAL_TABLET | Freq: Every day | ORAL | Status: DC
  Administered 2024-09-12: 100 mg via ORAL
  Filled 2024-09-11: qty 1

## 2024-09-11 MED ORDER — DEXTROSE 50 % IV SOLN
12.5000 g | INTRAVENOUS | Status: AC
  Administered 2024-09-11: 12.5 g via INTRAVENOUS

## 2024-09-11 MED ORDER — SODIUM CHLORIDE 0.9 % IV SOLN
INTRAVENOUS | Status: AC | PRN
Start: 2024-09-11 — End: 2024-09-12

## 2024-09-11 NOTE — Progress Notes (Signed)
 Nutrition Follow-up  DOCUMENTATION CODES:   Underweight, Severe malnutrition in context of chronic illness  INTERVENTION:   Continue po diet as tolerated. Continue Nepro Shakes  Pt is at high refeeding risk, close monitoring of electrolytes  Recommend continuing IV dextrose  with initiation of TF until pt no longer experiencing hypoglycemia  Tube Feeding via Cortrak:  Vital AF 1.2 at 20 ml/hr today Pro-Source TF20 60 mL TID TF Goal: Vital AF 1.2 at 50 ml/hr (no Pro-Source) at goal provides 1440 kcals 90 g of protein and 972 mL of free water   Continue Renal MVI daily Thiamine  100 mg daily, give 1x dose of IV Thiamine  now Add Folic Acid  1 mg daily  Recommend holding Renvela  (phosphorus binder) if pt not eating or eating bites only at meal times.    NUTRITION DIAGNOSIS:   Severe Malnutrition related to chronic illness (ESRD, HFrEF) as evidenced by severe fat depletion, severe muscle depletion.  Being addressed via TF   GOAL:   Patient will meet greater than or equal to 90% of their needs  Progressing  MONITOR:   PO intake, Supplement acceptance, Labs, Weight trends, I & O's  REASON FOR ASSESSMENT:   Consult Enteral/tube feeding initiation and management  ASSESSMENT:   43 y/o female with h/o CHF, Afib (not on anticoagulation), pericardial effusion with tamponade, SLE with nephritis, ESRD on HD, substance abuse, IDA, HTN, GERD, seizures, NICM and SDH who is admitted with volume overload, end-stage heart failure and AV fistula bleed.  9/04 Admitted, ICU 9/05 iHD with 1L removed with levophed  9/06 iHD with 2L removed with levophed  9/08 Transferred out of ICU, iHD, ongoing hypotension 9/12 Transferred back to ICU with hypoxia and hypotension, BiPap. Hypoglycemia reqiuring dextrose  gtt 9/13 iHD, off Bipap  Palliative care following, currently full code/full scope  Noted plan for iHD today. Pt had iHD on Saturday with net UF 0.4L. Pt is net positive currently. Noted  no plans for pressors or CRRT currently. Remains on midodrine   Significant issues with hypoglycemia; noted IVF changed to D10-1/2 NS at 50 ml/hr today due to hyponatremia. Noted multiple doses of D50 administered  PO intake has been very poor, pt drinking Nepro on visit today to help her blood sugar. Pt reports she has been drinking 3 to 4 per day but this is not reflected in the documentation in the chart. Noted not given/refused doses, RN reports 2 unopened Nepro at bedside this AM  Admit wt: 38 kg Current wt: 40 kg EDW: 37.5 kg (outpatient iHD)   Labs: CBGs 30-111 (persistent hypoglycemia) Sodium 121 (L) Potassium 46 (wdl) Phosphorus 5.4 (H) Magnesium 2.2 (wdl)  Meds: Midodrine  Rena-vite Renvela  with meals Thiamine    Diet Order:   Diet Order             Diet regular Room service appropriate? Yes with Assist; Fluid consistency: Thin; Fluid restriction: 1200 mL Fluid  Diet effective now                   EDUCATION NEEDS:   Not appropriate for education at this time  Skin:  Skin Assessment: Reviewed RN Assessment  Last BM:  9/14  Height:   Ht Readings from Last 1 Encounters:  08/31/24 5' 2 (1.575 m)    Weight:   Wt Readings from Last 1 Encounters:  09/11/24 40 kg    Ideal Body Weight:  50 kg  BMI:  Body mass index is 16.13 kg/m.  Estimated Nutritional Needs:   Kcal:  1500-1700kcal/day  Protein:  70-80g/day  Fluid:  UOP +1L   Betsey Finger MS, RDN, LDN, CNSC Registered Dietitian 3 Clinical Nutrition RD Inpatient Contact Info in Amion

## 2024-09-11 NOTE — Plan of Care (Signed)
 Brief Palliative Medicine Progress Note:  PMT following peripherally for needs/decline:  Medical records reviewed including progress notes, labs, imaging.   Goals are clear for full code and full scope care, including continuing dialysis for as long as tolerated. Per documentation, patient understands, likely soon, she will no longer be able to tolerate HD. CRRT would not be started as it would be considered futile care.   PMT will plan to follow up Wednesday 9/17.   If there are any imminent needs please call the service directly.   Thank you for allowing PMT to assist in the care of this patient.  Tee Richeson M. [facility] Palliative Medicine Team Team Phone: [phone] NO CHARGE

## 2024-09-11 NOTE — Evaluation (Signed)
 Physical Therapy RE-Evaluation Patient Details Name: [name] MRN: [id number] DOB: 06/19/1981 Today's Date: 09/11/2024  History of Present Illness  43 yo female admitted 08/31/24 for acute RF with hypoxia. 9/8 -9/12 progressive unit and on 9/12 ICU new bipap requirements and Hypotensive. PMH ESRD on HD, heart murmur, lupus, polysubstance abuse, seizures, chronic R SDH,cocaine  dependence, protein malnutrition hypotensive,  Clinical Impression  Re-evaluated after being transferred to ICU. Pt with significant weakness. Required mod assist +2 to stand bedside briefly. She is lethargic and feels exhausted. Oriented but with very low volume answering questions. Unable to obtain standing BP but after returning to supine MAP recorded at 66 (83 in supine prior to activity.) RN in room end of session. HOB elevated. Tolerated a couple of gentle LE exercises. Sat EOB for several minutes, only feeling dizzy upon standing. Patient will continue to benefit from skilled physical therapy services to further improve independence with functional mobility.         If plan is discharge home, recommend the following: Assistance with cooking/housework;Help with stairs or ramp for entrance;Assist for transportation;A lot of help with walking and/or transfers;A lot of help with bathing/dressing/bathroom   Can travel by private vehicle   No    Equipment Recommendations Rollator (4 wheels)  Recommendations for Other Services       Functional Status Assessment Patient has had a recent decline in their functional status and demonstrates the ability to make significant improvements in function in a reasonable and predictable amount of time.     Precautions / Restrictions Precautions Precautions: Fall Recall of Precautions/Restrictions: Intact Precaution/Restrictions Comments: watch sats and BP Restrictions Weight Bearing Restrictions Per Provider Order: No      Mobility  Bed Mobility Overal bed mobility:  Needs Assistance Bed Mobility: Rolling, Supine to Sit, Sit to Supine Rolling: Min assist   Supine to sit: Min assist Sit to supine: Mod assist   General bed mobility comments: Min assist to roll and rise to EOB with cues for technique. pt static sitting CGA. LUE supported for comfort due to pain reported. Mod assist for trunk and LEs back into bed.    Transfers Overall transfer level: Needs assistance Equipment used: 2 person hand held assist Transfers: Sit to/from Stand Sit to Stand: Mod assist, +2 safety/equipment           General transfer comment: Mod assist for boost to stand with bil UEs support +2 for safety. Less interactive upon standing, unable to check BP but does report feeling increase in dizziness. Pt was able to shift weight anteriorly and stabilize with min assist.    Ambulation/Gait                  Stairs            Wheelchair Mobility     Tilt Bed    Modified Rankin (Stroke Patients Only)       Balance Overall balance assessment: Needs assistance Sitting-balance support: Single extremity supported, Feet supported Sitting balance-Leahy Scale: Poor     Standing balance support: Bilateral upper extremity supported, During functional activity Standing balance-Leahy Scale: Poor Standing balance comment: UE support                             Pertinent Vitals/Pain Pain Assessment Pain Assessment: Faces Faces Pain Scale: Hurts worst Pain Location: L elbow and arm Pain Descriptors / Indicators: Aching, Grimacing, Discomfort Pain Intervention(s): Monitored during session, Limited activity  within patient's tolerance, Repositioned    Home Living Family/patient expects to be discharged to:: Private residence Living Arrangements: Parent Available Help at Discharge: Family;Available PRN/intermittently Type of Home: Apartment Home Access: Stairs to enter Entrance Stairs-Rails: Right;Left;Can reach both Entrance Stairs-Number  of Steps: 16   Home Layout: One level Home Equipment: Shower seat Additional Comments: has been at [facility] but really does not want to go back there. Hoping to live on her own in an apt but was with mother in second level apt with flight to enter before SNF.    Prior Function Prior Level of Function : Independent/Modified Independent             Mobility Comments: does not use DME ADLs Comments: fatigues at quickly, but can do everything for herself     Extremity/Trunk Assessment   Upper Extremity Assessment Upper Extremity Assessment: Defer to OT evaluation RUE Deficits / Details: generalized weakness LUE Deficits / Details: generalized weakness; wound on radial side of proximal forearm pt holding arm in flexed 90 degrees sustained    Lower Extremity Assessment Lower Extremity Assessment: Generalized weakness    Cervical / Trunk Assessment Cervical / Trunk Assessment: Kyphotic  Communication   Communication Communication: Impaired Factors Affecting Communication: Reduced clarity of speech    Cognition Arousal: Lethargic Behavior During Therapy: Flat affect   PT - Cognitive impairments: No apparent impairments                       PT - Cognition Comments: Oriented, very low volume when speaking Following commands: Intact       Cueing Cueing Techniques: Verbal cues     General Comments General comments (skin integrity, edema, etc.): HR 70s and BP reading 114/43 after movement. Map 83 and decreased to map 66. HFNC 11L    Exercises General Exercises - Lower Extremity Ankle Circles/Pumps: AROM, Both, 10 reps, Seated Quad Sets: Strengthening, AAROM, Both, 5 reps, Seated   Assessment/Plan    PT Assessment Patient needs continued PT services  PT Problem List Decreased strength;Cardiopulmonary status limiting activity;Pain;Decreased activity tolerance;Decreased balance;Decreased mobility       PT Treatment Interventions DME  instruction;Therapeutic exercise;Gait training;Balance training;Stair training;Functional mobility training;Therapeutic activities;Patient/family education    PT Goals (Current goals can be found in the Care Plan section)  Acute Rehab PT Goals Patient Stated Goal: get stronger PT Goal Formulation: With patient Time For Goal Achievement: 09/17/24 Potential to Achieve Goals: Fair    Frequency Min 2X/week     Co-evaluation PT/OT/SLP Co-Evaluation/Treatment: Yes Reason for Co-Treatment: For patient/therapist safety;Necessary to address cognition/behavior during functional activity PT goals addressed during session: Mobility/safety with mobility;Balance;Strengthening/ROM OT goals addressed during session: ADL's and self-care       AM-PAC PT 6 Clicks Mobility  Outcome Measure Help needed turning from your back to your side while in a flat bed without using bedrails?: None Help needed moving from lying on your back to sitting on the side of a flat bed without using bedrails?: A Little Help needed moving to and from a bed to a chair (including a wheelchair)?: A Lot Help needed standing up from a chair using your arms (e.g., wheelchair or bedside chair)?: A Lot Help needed to walk in hospital room?: Total Help needed climbing 3-5 steps with a railing? : Total 6 Click Score: 13    End of Session Equipment Utilized During Treatment: Gait belt Activity Tolerance: Patient limited by fatigue Patient left: in bed;with call bell/phone within reach;with  bed alarm set;with nursing/sitter in room Nurse Communication: Mobility status PT Visit Diagnosis: Muscle weakness (generalized) (M62.81);Other abnormalities of gait and mobility (R26.89);Difficulty in walking, not elsewhere classified (R26.2)    Time: 8896-8878 PT Time Calculation (min) (ACUTE ONLY): 18 min   Charges:   PT Evaluation $PT Re-evaluation: 1 Re-eval   PT General Charges $$ ACUTE PT VISIT: 1 Visit         Leontine Roads, PT, DPT [facility] Physical Therapist Office: [phone] Website: [email or web address]   Leontine GORMAN Roads 09/11/2024, 1:42 PM

## 2024-09-11 NOTE — Progress Notes (Signed)
 NAME:  [name], MRN:  [id number], DOB:  28-Jun-1981, LOS: 11 ADMISSION DATE:  08/31/2024, CONSULTATION DATE:  08/31/24 REFERRING MD: Dr. 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 Also had decreased appetite and fatigue. Denies fevers, chills. In ED, started on BiPAP. Able to transition to 6L [place]. CXR with pulmonary edema. K 5.3 BNP elevated. Normal WBC. LA 6.2>3.2. Given levaquin . SBP readings low-normal with intermittent readings with SBP in 60s and 70s but mentating well and eating a chicken wrap. PCCM and Nephrology consulted. Plan for hemodialysis in ICU.   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 HD 1.1L 9/5: HD removed 1 with levophed  9/6: HD removed 2L with levophed  9/7: Weaning pressors 9/8 moved out of ICU. Palliative care consulted. Also seen by vascular surg for bleeding of right AV fistula site. Suture placed. Pressure held. Advised against fistulogram, and advised HD cath if issues in future. On-going hypotension w/ HD noted.  9/9 -9/11 on-going supportive care on 9/12 poor po intake. No appetite, no endurance, not able to complete meal.  Started on dextrose  gtt. Called again for hypotension. Got 250 ML and placed supine BP improved.  9/12 transferred back to ICU for hypotension, hypoxia after fluid  bolus with very high BiPAP settings 9/13 iHD, able to come off bipap  Interim History / Subjective:  C/o headache  Objective   Blood pressure 110/67, pulse 77, temperature (!) 97.5 F (36.4 C), temperature source Axillary, resp. rate 19, height 5' 2 (1.575 m), weight 39.4 kg, SpO2 100%.    FiO2 (%):  [80 %-100 %] 80 %   Intake/Output Summary (Last 24 hours) at 09/11/2024 0740 Last data filed at 09/11/2024 0700 Gross per 24 hour  Intake 1302.03 ml  Output --  Net 1302.03 ml   Filed Weights   09/09/24 0805 09/10/24 0500 09/11/24 0400  Weight: 38.3 kg 41.4 kg 39.4 kg   Physical Exam: Chronically ill Trace edema Profound muscle wasting Abd soft Moves to command but weak  Resolved Hospital Problem list   Acute on chronic resp failure AGMA Hyperkalemia  Hypocalcemia  Bleeding from AVF site suture placed  Assessment & Plan:  Multiorgan failure due to complications of advanced lupus: ESRD, vasoplegia, cardioplegia. History cocaine  dependence Severe cachexia, deconditioning  Have been chasing CBGs that remain chronically low: local milieu in fingerbeds suspicious for chronic catabolic state so question accuracy.  Likewise O2 titrated to pleth which is poor at best.  Think best thing here is to place arterial line, check CBG and paO2 off of this and see if we are overtreating both  Regarding overall plan of care, need to try iHD with relaxed BP parameters to see if she is a candidate to continue this treatment, we are out of options for additional optimization.  If she does not tolerate iHD we may have to discuss medical futility.  Will ask heart  failure to confirm nothing further can be done.  ?utility of RHC  33 min cc time Rolan Sharps MD PCCM

## 2024-09-11 NOTE — Progress Notes (Addendum)
 eLink Physician-Brief Progress Note Patient Name: [name] DOB: 1981/09/30 MRN: [id number]   Date of Service  09/11/2024  HPI/Events of Note  Hypoglycemia on CBGs down to 51.  Unable to get labs, but already on dextrose  infusion.  Unable to pick up pulse oximetry on hands or elsewhere.  Unable to obtain ABG on last attempt.  Pleth unreliable, reading anywhere from 70s to 90s  eICU Interventions  Use alternative sites such as a stomach or chest for capillary glucose testing.  ear testing also yielded no blood.  Discontinue continuous pulse oximetry, limit to every 6 hours while awake.  Ideally, would have ABG to correlate oxygenation, but unable to obtain.  Overall findings are consistent with her known severe malperfusion.   0158 - AM labs reviewed, no intervention  Intervention Category Minor Interventions: Routine modifications to care plan (e.g. PRN medications for pain, fever)  Kyran Whittier 09/11/2024, 12:50 AM

## 2024-09-11 NOTE — Procedures (Signed)
 Cortrak  Person Inserting Tube:  Shereen Moats C, RD Tube Type:  Cortrak - 43 inches Tube Size:  10 Tube Location:  Left nare Secured by: Bridle Initial Placement:  Gastric Technique Used to Measure Tube Placement:  Marking at nare/corner of mouth Cortrak Secured At:  73 cm Initial Placement Verification:  Cortrak device (Registered Dieticians Only)  Cortrak Tube Team Note:  Consult received to place a Cortrak feeding tube.   No x-ray is required. RN may begin using tube.   If the tube becomes dislodged please keep the tube and contact the Cortrak team at [email or web address] for replacement.  If after hours and replacement cannot be delayed, place a NG tube and confirm placement with an abdominal x-ray.    Moats SQUIBB., RD, LDN, CNSC See AMiON for contact information

## 2024-09-11 NOTE — Consult Note (Signed)
 Advanced Heart Failure Team Consult Note   Primary Physician: Lorren Greig PARAS, NP Cardiologist:  Shelda Bruckner, MD  Reason for Consultation: Multi system organ failure 2/2 advanced lupus  HPI:    [name] is seen today for evaluation of multi system organ failure 2/2 advanced lupus at the request of Dr. Claudene, PCCM.   [name] is a 43 y.o. 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   [name] has had a prolonged hospitalization with multiple complications. Initially presented with SOB and fatigue despite reported iHD compliance. She has been struggling to tolerate iHD here with profound hypotension despite scheduled and PRN midodrine  and the intt use of NE. She was moved out of the ICU 9/8 and palliative care was consulted. She has been very clear she wants to remain full code. Vascular briefly saw with bleeding from AVF, now resolved. She has continued to progressively deteriorate, now requiring D10 infusion with decreased appetite with poor PO intake. 9/12 she had to return to the ICU d/t new Bipap requirements and hypotension. Only able to dialyze 400 cc's during last session 2/2 hypotension.   Resting in bed, not very interactive. Drowsy on exam.   Echo 9/25: EF 30-35%, LV with GHK, RV severely reduced, LA/RA severely dilated, mod MR, severe TR  Home Medications Prior to Admission medications   Medication Sig Start Date End Date Taking? Authorizing Provider  amiodarone  (PACERONE ) 200 MG tablet Take 1 tablet (200 mg total) by mouth daily. 04/24/24  Yes Raenelle Coria, MD  cinacalcet  (SENSIPAR ) 30 MG tablet Take 30 mg by mouth daily.   Yes [provider]  doxercalciferol  (HECTOROL ) 4 MCG/2ML injection Inject 0.5 mLs (1 mcg total) into the vein Every Tuesday,Thursday,and Saturday with dialysis. 05/23/24  Yes Rosario Leatrice FERNS, MD  Menthol , Topical Analgesic, (BIOFREEZE) 10 % CREA Apply 1 Application topically daily as needed. 05/31/24  Yes Lorren, Amy J, NP  midodrine  (PROAMATINE ) 10 MG tablet Take 1 tablet (10 mg total) by mouth 3 (three) times daily with meals. 05/12/24  Yes Swayze, Ava, DO  sertraline  (ZOLOFT ) 25 MG tablet Take 1 tablet (25 mg total) by mouth daily. 05/31/24  Yes Lorren, Amy J, NP  sevelamer  carbonate (RENVELA ) 800 MG tablet Take 4 tablets (3,200 mg total) by mouth 3 (three) times daily with meals. 05/31/24  Yes Lorren, Amy J, NP  triamcinolone  (KENALOG ) 0.025 % cream Apply 1 Application topically 2 (two) times daily. Patient not taking: Reported on 09/03/2024 06/01/24   Lorren Greig PARAS, NP    Past Medical History: Past Medical History:  Diagnosis Date   Anemia    low iron  - receives iron  at dialysis   Anxiety    Arthritis    RA   Atrial fibrillation with RVR (HCC) 12/14/2023   Chronic systolic congestive heart failure (HCC) 03/16/2016   Dyspnea    ESRD (end stage renal disease) (HCC)    [facility] _ [place]   H/O pericarditis 01/17/2013   H/O pleural effusion 01/17/2013   Heart murmur    Lupus (systemic lupus erythematosus) (HCC)    Previously followed with Dr. 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 pneumonae bacteremia on 01/21/2013. Sensitive to Peniccilin     Past Surgical History: Past Surgical History:  Procedure Laterality Date   A/V FISTULAGRAM N/A 12/15/2023   Procedure: A/V  Fistulagram;  Surgeon: Melia Lynwood ORN, MD;  Location: [facility] INVASIVE CV LAB;  Service: Cardiovascular;  Laterality: N/A;   A/V FISTULAGRAM Right 08/10/2024   Procedure: A/V Fistulagram;  Surgeon: Sheree Penne Bruckner, MD;  Location: [facility] PV LAB;  Service: Cardiovascular;  Laterality: Right;   A/V SHUNT INTERVENTION Right 08/23/2024   Procedure: A/V SHUNT INTERVENTION;  Surgeon: Pearline Norman RAMAN, MD;  Location: [facility] PV LAB;  Service: Cardiovascular;  Laterality: Right;   AV FISTULA PLACEMENT     AV FISTULA PLACEMENT Right 08/01/2020   Procedure: RIGHT ARM BRACHIOCEPHALIC  ARTERIOVENOUS (AV) FISTULA CREATION;  Surgeon: Oris Krystal FALCON, MD;  Location: [facility] OR;  Service: Vascular;  Laterality: Right;   BASCILIC VEIN TRANSPOSITION Left 02/05/2014   Procedure: BASCILIC VEIN TRANSPOSITION;  Surgeon: Krystal FALCON Oris, MD;  Location: [facility] OR;  Service: Vascular;  Laterality: Left;   BASCILIC VEIN TRANSPOSITION Right 01/08/2021   Procedure: RIGHT ARM SECOND STAGE BASCILIC VEIN TRANSPOSITION;  Surgeon: Oris Krystal FALCON, MD;  Location: [facility] OR;  Service: Vascular;  Laterality: Right;   BIOPSY  12/17/2021   Procedure: BIOPSY;  Surgeon: Wilhelmenia Aloha Raddle., MD;  Location: [facility] ENDOSCOPY;  Service: Gastroenterology;;   ESOPHAGOGASTRODUODENOSCOPY (EGD) WITH PROPOFOL  N/A 12/17/2021   Procedure: ESOPHAGOGASTRODUODENOSCOPY (EGD) WITH PROPOFOL ;  Surgeon: Wilhelmenia Aloha Raddle., MD;  Location: [facility] ENDOSCOPY;  Service: Gastroenterology;  Laterality: N/A;   FISTULA SUPERFICIALIZATION Left 05/30/2018   Procedure: FISTULA PLICATION BASILIC VEIN TRANSPOSITION;  Surgeon: Eliza Bruckner RAMAN, MD;  Location: [facility] OR;  Service: Vascular;  Laterality: Left;   FISTULA SUPERFICIALIZATION Left 12/18/2019   Procedure: PLICATION OF LEFT ARTERIOVENOUS FISTULA ULCER;  Surgeon: Oris Krystal FALCON, MD;  Location: [facility] OR;  Service: Vascular;  Laterality: Left;   I & D EXTREMITY Right 02/18/2021   Procedure: IRRIGATION AND DEBRIDEMENT OF ARM;  Surgeon: Eliza Bruckner RAMAN, MD;  Location: [facility] OR;  Service: Vascular;  Laterality: Right;   INSERTION OF DIALYSIS CATHETER N/A 05/19/2020   Procedure: TUNNELED INSERTION  OF DIALYSIS CATHETER;  Surgeon: Sheree Penne Bruckner, MD;  Location: [facility] OR;  Service: Vascular;  Laterality: N/A;   PERIPHERAL VASCULAR BALLOON ANGIOPLASTY  12/15/2023   Procedure: PERIPHERAL VASCULAR BALLOON ANGIOPLASTY;  Surgeon: Melia Lynwood ORN, MD;  Location: [facility] INVASIVE CV LAB;  Service: Cardiovascular;;   THROMBECTOMY AND REVISION OF ARTERIOVENTOUS (AV) GORETEX  GRAFT Left 07/28/2020   Procedure: Oversewing of left arm Brachial cephalic fistula for bleeding.;  Surgeon: Eliza Bruckner RAMAN, MD;  Location: [facility] OR;  Service: Vascular;  Laterality: Left;   VENOGRAM Right 01/31/2014   Procedure: DIALYSIS CATHETER;  Surgeon: Gaile ORN New, MD;  Location: [facility] CATH LAB;  Service: Cardiovascular;  Laterality: Right;   VENOUS ANGIOPLASTY  08/10/2024   Procedure: VENOUS ANGIOPLASTY;  Surgeon: Sheree Penne Bruckner, MD;  Location: [facility] PV LAB;  Service: Cardiovascular;;  svc and innominante   VENOUS ANGIOPLASTY  08/23/2024   Procedure: VENOUS ANGIOPLASTY;  Surgeon: Pearline Norman RAMAN, MD;  Location: [facility] PV LAB;  Service: Cardiovascular;;  70% AVF- 95% innominate  Family History: History reviewed. No pertinent family history.  Social History: Social History   Socioeconomic History   Marital status: Single    Spouse name: Not on file   Number of children: Not on file   Years of education: Not on file   Highest education level: Not on file  Occupational History   Not on file  Tobacco Use   Smoking status: Every Day    Current packs/day: 0.12    Average packs/day: 0.1 packs/day for 15.0 years (1.8 ttl pk-yrs)    Types: Cigarettes    Passive exposure: Current   Smokeless tobacco: Never   Tobacco comments:    4 cigarettes per day  Vaping Use   Vaping status: Never Used  Substance and Sexual Activity   Alcohol  use: Yes     Alcohol /week: 0.0 standard drinks of alcohol     Comment: Special Occasional takes Vicar   Drug use: Yes    Types: Marijuana    Comment: ocassional, last time- 12/21/20   Sexual activity: Not Currently    Birth control/protection: None  Other Topics Concern   Not on file  Social History Narrative   Not on file   Social Drivers of Health   Financial Resource Strain: Low Risk  (05/31/2024)   Overall Financial Resource Strain (CARDIA)    Difficulty of Paying Living Expenses: Not hard at all  Food Insecurity: Patient Declined (09/04/2024)   Hunger Vital Sign    Worried About Running Out of Food in the Last Year: Patient declined    Ran Out of Food in the Last Year: Patient declined  Transportation Needs: No Transportation Needs (05/20/2024)   PRAPARE - Administrator, Civil Service (Medical): No    Lack of Transportation (Non-Medical): No  Physical Activity: Inactive (05/31/2024)   Exercise Vital Sign    Days of Exercise per Week: 0 days    Minutes of Exercise per Session: 0 min  Stress: Stress Concern Present (05/31/2024)   [employer] of Occupational Health - Occupational Stress Questionnaire    Feeling of Stress : To some extent  Social Connections: Socially Isolated (05/31/2024)   Social Connection and Isolation Panel    Frequency of Communication with Friends and Family: Never    Frequency of Social Gatherings with Friends and Family: Never    Attends Religious Services: Never    Database administrator or Organizations: No    Attends Banker Meetings: Never    Marital Status: Never married   Allergies:  Allergies  Allergen Reactions   Cephalosporins Rash and Other (See Comments)    To both keflex  and cefazolin .  Potential bullous lesion from Ceftriaxone  11/2023.   Tobramycin Sulfate Swelling    Eye swelling   Vancomycin  Swelling   Objective:    Vital Signs:   Temp:  [97.4 F (36.3 C)-97.7 F (36.5 C)] 97.7 F (36.5 C) (09/15 0800) Pulse  Rate:  [73-114] 75 (09/15 1130) Resp:  [12-33] 15 (09/15 1130) BP: (56-168)/(13-77) 101/73 (09/15 1130) SpO2:  [62 %-100 %] 100 % (09/15 1130) FiO2 (%):  [80 %-100 %] 80 % (09/15 0500) Weight:  [39.4 kg] 39.4 kg (09/15 0400) Last BM Date : 09/10/24  Weight change: Filed Weights   09/09/24 0805 09/10/24 0500 09/11/24 0400  Weight: 38.3 kg 41.4 kg 39.4 kg   Intake/Output:   Intake/Output Summary (Last 24 hours) at 09/11/2024 1132 Last data filed at 09/11/2024 1115 Gross per 24 hour  Intake 1198.22 ml  Output --  Net 1198.22 ml    Physical Exam  General:  cachectic / frail appearing.   Neck: JVD ~8 cm.  Cor: Regular rate & irregular rhythm.  Lungs: clear Extremities: no edema  Neuro: drowsy. Affect flat.  Telemetry   A fib 70s (Personally reviewed)    Labs   Basic Metabolic Panel: Recent Labs  Lab 09/05/24 1410 09/06/24 0332 09/08/24 1724 09/10/24 0322 09/11/24 0040 09/11/24 0128 09/11/24 0416  NA 127* 132* 125* 125* 118* 120* 121*  K 4.1 4.2 4.8 4.1 5.6* 4.3 4.6  CL 88* 93* 87* 90* 83*  --  86*  CO2 24 25 21* 22 15*  --  20*  GLUCOSE 126* 91 93 95 54*  --  71  BUN 47* 30* 41* 36* 45*  --  49*  CREATININE 5.04* 3.78* 4.46* 3.60* 4.32*  --  4.43*  CALCIUM  9.3 9.4 8.9 8.8* 8.5*  --  8.7*  MG 2.3 2.2  --  2.3  --   --  2.2  PHOS 5.5* 4.3  --  5.1*  --   --  5.4*    Liver Function Tests: Recent Labs  Lab 09/05/24 1410 09/06/24 0332 09/08/24 1724  AST  --   --  42*  ALT  --   --  19  ALKPHOS  --   --  317*  BILITOT  --   --  2.1*  PROT  --   --  7.6  ALBUMIN  2.3* 2.4* 2.3*   No results for input(s): LIPASE, AMYLASE in the last 168 hours. No results for input(s): AMMONIA in the last 168 hours.  CBC: Recent Labs  Lab 09/05/24 1410 09/06/24 0332 09/08/24 1724 09/10/24 0322 09/11/24 0128  WBC 8.8 8.5 7.9 11.1*  --   NEUTROABS  --   --  5.5  --   --   HGB 11.7* 11.9* 11.1* 9.8* 12.2  HCT 35.2* 36.0 34.3* 29.9* 36.0  MCV 83.8 84.3 85.5 86.7   --   PLT 159 130* 111* 131*  --     Cardiac Enzymes: No results for input(s): CKTOTAL, CKMB, CKMBINDEX, TROPONINI in the last 168 hours.  BNP: BNP (last 3 results) Recent Labs    05/18/24 2210 08/31/24 0725 09/01/24 0413  BNP >4,500.0* >4,500.0* >4,500.0*    ProBNP (last 3 results) No results for input(s): PROBNP in the last 8760 hours.   CBG: Recent Labs  Lab 09/11/24 0435 09/11/24 0510 09/11/24 0750 09/11/24 0954 09/11/24 1047  GLUCAP 64* 118* 36* 30* 111*    Coagulation Studies: No results for input(s): LABPROT, INR in the last 72 hours.   Imaging   No results found.   Medications:     Current Medications:  amiodarone   400 mg Oral BID   Chlorhexidine  Gluconate Cloth  6 each Topical Daily   diphenhydrAMINE   25 mg Intravenous Once   feeding supplement (NEPRO CARB STEADY)  237 mL Oral TID BM   folic acid   1 mg Oral Daily   heparin   5,000 Units Subcutaneous Q8H   hydrocortisone  sod succinate (SOLU-CORTEF ) inj  100 mg Intravenous Q12H   midodrine   15 mg Oral TID WC   multivitamin  1 tablet Oral QHS   mupirocin  ointment  1 Application Nasal BID   mouth rinse  15 mL Mouth Rinse 4 times per day   mouth rinse  15 mL Mouth Rinse 4 times per day   oxidized cellulose  1 each Topical Once   sertraline   25 mg Oral  Daily   sevelamer  carbonate  3,200 mg Oral TID WC   [START ON 09/12/2024] thiamine   100 mg Oral Daily    Infusions:  sodium chloride      dextrose  10 % and 0.45 % NaCl 50 mL/hr at 09/11/24 1115    Patient Profile   [name] is a 43 y.o. 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 Admitted with multiorgan failure due to advanced lupus.   Assessment/Plan  Chronic hypotension 2/2 vasoplegia>>Multi system organ failure 2/2 advanced lupus - Continue midodrine  15 mg TID - SBP 60s-80s on calf -  Plan for a line today - Compression socks too large for her legs, pediatric? - Suspect abd binder would be too large as well.  - Overall poor prognosis with ongoing hypotension, frailty and comorbidities as she will be unable to tolerate iHD  HFrEF with RV failure - Echo 9/25: EF 30-35%, LV with GHK, RV severely reduced, LA/RA severely dilated, mod MR, severe TR - NYHA IV on admission - Volume management per iHD - No GDMT with ESRD and hypotension - She is not a candidate for advanced therapies with numerous comorbidities, hx cocaine  use and severe deconditioning/frailty  ESRD Lupus nephritis - Has been having difficulty tolerating iHD. Has required levo in the past - Only 400 ccs removed during last session - Continue midodrine  15 mg TID  Valvular heart disease - Echo 9/25 with mod MR, severe TR and mod-severe aortic valve regurgitation - Not a candidate for intervention with frailty  A fib/flutter - A fib 70s on tele - Decrease amiodarone  400>200 mg BID - off AC with hx of SDH and recurrent falls  Hx cocaine  dependence - last + 5/25  Respiratory failure - required BiPAP 9/12 - Stable on [place] now - PCCM following  Cachexia/ Frailty Protein malnutrition>>Hypoglycemia Deconditioning - per primary team - Palliative care last saw 9/11, at that time patient wished for full code and full scope of care. Discussions ongoing.  - Now requiring D10  CRITICAL CARE Performed by: Beckey LITTIE Coe  Total critical care time: 18 minutes  Critical care time was exclusive of separately billable procedures and treating other patients.  Critical care was necessary to treat or prevent imminent or life-threatening deterioration.  Critical care was time spent personally by me on the following activities: development of treatment plan with patient and/or surrogate as well as nursing, discussions with consultants, evaluation of patient's response to treatment, examination of patient, obtaining  history from patient or surrogate, ordering and performing treatments and interventions, ordering and review of laboratory studies, ordering and review of radiographic studies, pulse oximetry and re-evaluation of patient's condition.   Length of Stay: 11  Beckey LITTIE Coe, NP  09/11/2024, 11:32 AM   Advanced Heart Failure Team Pager [phone] (M-F; 7a - 5p)  Please contact [facility] for night-coverage after hours (4p -7a ) and weekends on [email or web address]

## 2024-09-11 NOTE — Progress Notes (Signed)
 Occupational Therapy Evaluation Patient Details Name: [name] MRN: [id number] DOB: Sep 30, 1981 Today's Date: 09/11/2024   History of Present Illness   43 yo female admitted 08/31/24 for acute RF with hypoxia.9/8 -9/12progressive unit and on 9/12 ICU new bipap requirements and Hypotensive PMH ESRD on HD, heart murmur, lupus, polysubstance abuse, seizures, chronic R SDH,cocaine  dependence, protein malnutrition hypotensive,     Clinical Impressions PT admitted with RF with hypoxia. Pt currently with functional limitiations due to the deficits listed below (see OT problem list). Pt currently requires (A) for basic bed mobility and extreme fatigue. Pt with hypotension and difficult reading on monitor. OT using patients symptoms as a guide.   Pt will benefit from skilled OT to increase their independence and safety with adls and balance to allow discharge skilled inpatient follow up therapy, <3 hours/day. .      If plan is discharge home, recommend the following:   A lot of help with walking and/or transfers;A lot of help with bathing/dressing/bathroom     Functional Status Assessment   Patient has had a recent decline in their functional status and demonstrates the ability to make significant improvements in function in a reasonable and predictable amount of time.     Equipment Recommendations   BSC/3in1;Wheelchair (measurements OT);Wheelchair cushion (measurements OT);Hospital bed     Recommendations for Other Services   PT consult     Precautions/Restrictions   Precautions Precautions: Fall Recall of Precautions/Restrictions: Intact Precaution/Restrictions Comments: watch sats and BP Restrictions Weight Bearing Restrictions Per Provider Order: No     Mobility Bed Mobility Overal bed mobility: Needs Assistance Bed Mobility: Rolling, Supine to Sit, Sit to Supine Rolling: Min assist   Supine to sit: Min assist Sit to supine: Mod assist   General bed  mobility comments: pt static sitting CGA. pt needs (A) To progress L UE to eob and on pillow for comfort    Transfers Overall transfer level: Needs assistance   Transfers: Sit to/from Stand Sit to Stand: Mod assist, +2 safety/equipment           General transfer comment: second person present due to hypotensive with poor reading. pt started to close eyes more and anterior shift so returning to bed and laying down. pt yes very softly spoken when asked if she needs to lay down      Balance Overall balance assessment: Needs assistance Sitting-balance support: Single extremity supported, Feet supported Sitting balance-Leahy Scale: Fair     Standing balance support: Bilateral upper extremity supported, During functional activity Standing balance-Leahy Scale: Poor                             ADL either performed or assessed with clinical judgement   ADL Overall ADL's : Needs assistance/impaired     Grooming: Set up;Sitting Grooming Details (indicate cue type and reason): eob washing face with R UE     Lower Body Bathing: Maximal assistance                         General ADL Comments: pt limited to eob sitting due to fatigue     Vision         Perception         Praxis         Pertinent Vitals/Pain Pain Assessment Pain Assessment: Faces Faces Pain Scale: Hurts worst Pain Location: L elbow and arm Pain Descriptors / Indicators: Aching,  Grimacing, Discomfort Pain Intervention(s): Limited activity within patient's tolerance, Monitored during session, Repositioned     Extremity/Trunk Assessment Upper Extremity Assessment Upper Extremity Assessment: Right hand dominant RUE Deficits / Details: generalized weakness LUE Deficits / Details: generalized weakness; wound on radial side of proximal forearm pt holding arm in flexed 90 degrees sustained   Lower Extremity Assessment Lower Extremity Assessment: Defer to PT evaluation   Cervical /  Trunk Assessment Cervical / Trunk Assessment: Kyphotic   Communication     Cognition Arousal: Lethargic Behavior During Therapy: Flat affect Cognition: No apparent impairments             OT - Cognition Comments: Oriented x4 very soft spoken                 Following commands: Intact       Cueing  General Comments      HR 70s and BP reading 114/43 after movement. Map 83 and decreased to map 66. HFNC 11L   Exercises     Shoulder Instructions      Home Living Family/patient expects to be discharged to:: Private residence Living Arrangements: Parent Available Help at Discharge: Family;Available PRN/intermittently Type of Home: Apartment Home Access: Stairs to enter Entrance Stairs-Number of Steps: 16 Entrance Stairs-Rails: Right;Left;Can reach both Home Layout: One level     Bathroom Shower/Tub: Chief Strategy Officer: Standard Bathroom Accessibility: Yes How Accessible: Accessible via walker Home Equipment: Shower seat   Additional Comments: has been at [facility] but really does not want to go back there. Hoping to live on her own in an apt but was with mother in second level apt with flight to enter before SNF.      Prior Functioning/Environment Prior Level of Function : Independent/Modified Independent             Mobility Comments: does not use DME ADLs Comments: fatigues at quickly, but can do everything for herself    OT Problem List: Decreased strength;Decreased activity tolerance;Impaired balance (sitting and/or standing);Decreased safety awareness;Decreased knowledge of use of DME or AE;Decreased knowledge of precautions;Cardiopulmonary status limiting activity   OT Treatment/Interventions: Self-care/ADL training;Therapeutic exercise;Energy conservation;DME and/or AE instruction;Therapeutic activities;Patient/family education;Balance training      OT Goals(Current goals can be found in the care plan section)   Acute  Rehab OT Goals Patient Stated Goal: to take a nap OT Goal Formulation: With patient Time For Goal Achievement: 09/25/24 Potential to Achieve Goals: Fair   OT Frequency:  Min 2X/week    Co-evaluation PT/OT/SLP Co-Evaluation/Treatment: Yes Reason for Co-Treatment: For patient/therapist safety;Necessary to address cognition/behavior during functional activity   OT goals addressed during session: ADL's and self-care      AM-PAC OT 6 Clicks Daily Activity     Outcome Measure Help from another person eating meals?: A Little Help from another person taking care of personal grooming?: A Little Help from another person toileting, which includes using toliet, bedpan, or urinal?: A Lot Help from another person bathing (including washing, rinsing, drying)?: A Lot Help from another person to put on and taking off regular upper body clothing?: A Lot Help from another person to put on and taking off regular lower body clothing?: A Lot 6 Click Score: 14   End of Session Equipment Utilized During Treatment: Oxygen Nurse Communication: Mobility status;Precautions  Activity Tolerance: Patient limited by fatigue Patient left: in bed;with call bell/phone within reach;with nursing/sitter in room  OT Visit Diagnosis: Unsteadiness on feet (R26.81);Muscle weakness (generalized) (M62.81)  Time: 8889-8878 OT Time Calculation (min): 11 min Charges:  OT General Charges $OT Visit: 1 Visit OT Evaluation $OT Re-eval: 1 Re-eval   Brynn, OTR/L  Acute Rehabilitation Services Office: [phone] .   Ely Molt 09/11/2024, 1:14 PM

## 2024-09-11 NOTE — Progress Notes (Addendum)
 Received patient in bed.Alert and oriented x  3.Consent verified.  Access used: Right arm avf that worked well.  Duration of treatment: 3.5 hrs.  UF goal : Met 1.3 liter,she tolerated her treatment:  Medicine given: Midodrine  per unit /floor schedule.   Hand off to the patient's nurse at bedside,with stable vitals.

## 2024-09-11 NOTE — H&P (View-Only) (Signed)
 Advanced Heart Failure Team Consult Note   Primary Physician: Lorren Greig PARAS, NP Cardiologist:  Shelda Bruckner, MD  Reason for Consultation: Multi system organ failure 2/2 advanced lupus  HPI:    [name] is seen today for evaluation of multi system organ failure 2/2 advanced lupus at the request of Dr. Claudene, PCCM.   [name] is a 43 y.o. 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   [name] has had a prolonged hospitalization with multiple complications. Initially presented with SOB and fatigue despite reported iHD compliance. She has been struggling to tolerate iHD here with profound hypotension despite scheduled and PRN midodrine  and the intt use of NE. She was moved out of the ICU 9/8 and palliative care was consulted. She has been very clear she wants to remain full code. Vascular briefly saw with bleeding from AVF, now resolved. She has continued to progressively deteriorate, now requiring D10 infusion with decreased appetite with poor PO intake. 9/12 she had to return to the ICU d/t new Bipap requirements and hypotension. Only able to dialyze 400 cc's during last session 2/2 hypotension.   Resting in bed, not very interactive. Drowsy on exam.   Echo 9/25: EF 30-35%, LV with GHK, RV severely reduced, LA/RA severely dilated, mod MR, severe TR  Home Medications Prior to Admission medications   Medication Sig Start Date End Date Taking? Authorizing Provider  amiodarone  (PACERONE ) 200 MG tablet Take 1 tablet (200 mg total) by mouth daily. 04/24/24  Yes Raenelle Coria, MD  cinacalcet  (SENSIPAR ) 30 MG tablet Take 30 mg by mouth daily.   Yes [provider]  doxercalciferol  (HECTOROL ) 4 MCG/2ML injection Inject 0.5 mLs (1 mcg total) into the vein Every Tuesday,Thursday,and Saturday with dialysis. 05/23/24  Yes Rosario Leatrice FERNS, MD  Menthol , Topical Analgesic, (BIOFREEZE) 10 % CREA Apply 1 Application topically daily as needed. 05/31/24  Yes Lorren, Amy J, NP  midodrine  (PROAMATINE ) 10 MG tablet Take 1 tablet (10 mg total) by mouth 3 (three) times daily with meals. 05/12/24  Yes Swayze, Ava, DO  sertraline  (ZOLOFT ) 25 MG tablet Take 1 tablet (25 mg total) by mouth daily. 05/31/24  Yes Lorren, Amy J, NP  sevelamer  carbonate (RENVELA ) 800 MG tablet Take 4 tablets (3,200 mg total) by mouth 3 (three) times daily with meals. 05/31/24  Yes Lorren, Amy J, NP  triamcinolone  (KENALOG ) 0.025 % cream Apply 1 Application topically 2 (two) times daily. Patient not taking: Reported on 09/03/2024 06/01/24   Lorren Greig PARAS, NP    Past Medical History: Past Medical History:  Diagnosis Date   Anemia    low iron  - receives iron  at dialysis   Anxiety    Arthritis    RA   Atrial fibrillation with RVR (HCC) 12/14/2023   Chronic systolic congestive heart failure (HCC) 03/16/2016   Dyspnea    ESRD (end stage renal disease) (HCC)    [facility] _ [place]   H/O pericarditis 01/17/2013   H/O pleural effusion 01/17/2013   Heart murmur    Lupus (systemic lupus erythematosus) (HCC)    Previously followed with Dr. 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 pneumonae bacteremia on 01/21/2013. Sensitive to Peniccilin     Past Surgical History: Past Surgical History:  Procedure Laterality Date   A/V FISTULAGRAM N/A 12/15/2023   Procedure: A/V  Fistulagram;  Surgeon: Melia Lynwood ORN, MD;  Location: [facility] INVASIVE CV LAB;  Service: Cardiovascular;  Laterality: N/A;   A/V FISTULAGRAM Right 08/10/2024   Procedure: A/V Fistulagram;  Surgeon: Sheree Penne Bruckner, MD;  Location: [facility] PV LAB;  Service: Cardiovascular;  Laterality: Right;   A/V SHUNT INTERVENTION Right 08/23/2024   Procedure: A/V SHUNT INTERVENTION;  Surgeon: Pearline Norman RAMAN, MD;  Location: [facility] PV LAB;  Service: Cardiovascular;  Laterality: Right;   AV FISTULA PLACEMENT     AV FISTULA PLACEMENT Right 08/01/2020   Procedure: RIGHT ARM BRACHIOCEPHALIC  ARTERIOVENOUS (AV) FISTULA CREATION;  Surgeon: Oris Krystal FALCON, MD;  Location: [facility] OR;  Service: Vascular;  Laterality: Right;   BASCILIC VEIN TRANSPOSITION Left 02/05/2014   Procedure: BASCILIC VEIN TRANSPOSITION;  Surgeon: Krystal FALCON Oris, MD;  Location: [facility] OR;  Service: Vascular;  Laterality: Left;   BASCILIC VEIN TRANSPOSITION Right 01/08/2021   Procedure: RIGHT ARM SECOND STAGE BASCILIC VEIN TRANSPOSITION;  Surgeon: Oris Krystal FALCON, MD;  Location: [facility] OR;  Service: Vascular;  Laterality: Right;   BIOPSY  12/17/2021   Procedure: BIOPSY;  Surgeon: Wilhelmenia Aloha Raddle., MD;  Location: [facility] ENDOSCOPY;  Service: Gastroenterology;;   ESOPHAGOGASTRODUODENOSCOPY (EGD) WITH PROPOFOL  N/A 12/17/2021   Procedure: ESOPHAGOGASTRODUODENOSCOPY (EGD) WITH PROPOFOL ;  Surgeon: Wilhelmenia Aloha Raddle., MD;  Location: [facility] ENDOSCOPY;  Service: Gastroenterology;  Laterality: N/A;   FISTULA SUPERFICIALIZATION Left 05/30/2018   Procedure: FISTULA PLICATION BASILIC VEIN TRANSPOSITION;  Surgeon: Eliza Bruckner RAMAN, MD;  Location: [facility] OR;  Service: Vascular;  Laterality: Left;   FISTULA SUPERFICIALIZATION Left 12/18/2019   Procedure: PLICATION OF LEFT ARTERIOVENOUS FISTULA ULCER;  Surgeon: Oris Krystal FALCON, MD;  Location: [facility] OR;  Service: Vascular;  Laterality: Left;   I & D EXTREMITY Right 02/18/2021   Procedure: IRRIGATION AND DEBRIDEMENT OF ARM;  Surgeon: Eliza Bruckner RAMAN, MD;  Location: [facility] OR;  Service: Vascular;  Laterality: Right;   INSERTION OF DIALYSIS CATHETER N/A 05/19/2020   Procedure: TUNNELED INSERTION  OF DIALYSIS CATHETER;  Surgeon: Sheree Penne Bruckner, MD;  Location: [facility] OR;  Service: Vascular;  Laterality: N/A;   PERIPHERAL VASCULAR BALLOON ANGIOPLASTY  12/15/2023   Procedure: PERIPHERAL VASCULAR BALLOON ANGIOPLASTY;  Surgeon: Melia Lynwood ORN, MD;  Location: [facility] INVASIVE CV LAB;  Service: Cardiovascular;;   THROMBECTOMY AND REVISION OF ARTERIOVENTOUS (AV) GORETEX  GRAFT Left 07/28/2020   Procedure: Oversewing of left arm Brachial cephalic fistula for bleeding.;  Surgeon: Eliza Bruckner RAMAN, MD;  Location: [facility] OR;  Service: Vascular;  Laterality: Left;   VENOGRAM Right 01/31/2014   Procedure: DIALYSIS CATHETER;  Surgeon: Gaile ORN New, MD;  Location: [facility] CATH LAB;  Service: Cardiovascular;  Laterality: Right;   VENOUS ANGIOPLASTY  08/10/2024   Procedure: VENOUS ANGIOPLASTY;  Surgeon: Sheree Penne Bruckner, MD;  Location: [facility] PV LAB;  Service: Cardiovascular;;  svc and innominante   VENOUS ANGIOPLASTY  08/23/2024   Procedure: VENOUS ANGIOPLASTY;  Surgeon: Pearline Norman RAMAN, MD;  Location: [facility] PV LAB;  Service: Cardiovascular;;  70% AVF- 95% innominate  Family History: History reviewed. No pertinent family history.  Social History: Social History   Socioeconomic History   Marital status: Single    Spouse name: Not on file   Number of children: Not on file   Years of education: Not on file   Highest education level: Not on file  Occupational History   Not on file  Tobacco Use   Smoking status: Every Day    Current packs/day: 0.12    Average packs/day: 0.1 packs/day for 15.0 years (1.8 ttl pk-yrs)    Types: Cigarettes    Passive exposure: Current   Smokeless tobacco: Never   Tobacco comments:    4 cigarettes per day  Vaping Use   Vaping status: Never Used  Substance and Sexual Activity   Alcohol  use: Yes     Alcohol /week: 0.0 standard drinks of alcohol     Comment: Special Occasional takes Vicar   Drug use: Yes    Types: Marijuana    Comment: ocassional, last time- 12/21/20   Sexual activity: Not Currently    Birth control/protection: None  Other Topics Concern   Not on file  Social History Narrative   Not on file   Social Drivers of Health   Financial Resource Strain: Low Risk  (05/31/2024)   Overall Financial Resource Strain (CARDIA)    Difficulty of Paying Living Expenses: Not hard at all  Food Insecurity: Patient Declined (09/04/2024)   Hunger Vital Sign    Worried About Running Out of Food in the Last Year: Patient declined    Ran Out of Food in the Last Year: Patient declined  Transportation Needs: No Transportation Needs (05/20/2024)   PRAPARE - Administrator, Civil Service (Medical): No    Lack of Transportation (Non-Medical): No  Physical Activity: Inactive (05/31/2024)   Exercise Vital Sign    Days of Exercise per Week: 0 days    Minutes of Exercise per Session: 0 min  Stress: Stress Concern Present (05/31/2024)   [employer] of Occupational Health - Occupational Stress Questionnaire    Feeling of Stress : To some extent  Social Connections: Socially Isolated (05/31/2024)   Social Connection and Isolation Panel    Frequency of Communication with Friends and Family: Never    Frequency of Social Gatherings with Friends and Family: Never    Attends Religious Services: Never    Database administrator or Organizations: No    Attends Banker Meetings: Never    Marital Status: Never married   Allergies:  Allergies  Allergen Reactions   Cephalosporins Rash and Other (See Comments)    To both keflex  and cefazolin .  Potential bullous lesion from Ceftriaxone  11/2023.   Tobramycin Sulfate Swelling    Eye swelling   Vancomycin  Swelling   Objective:    Vital Signs:   Temp:  [97.4 F (36.3 C)-97.7 F (36.5 C)] 97.7 F (36.5 C) (09/15 0800) Pulse  Rate:  [73-114] 75 (09/15 1130) Resp:  [12-33] 15 (09/15 1130) BP: (56-168)/(13-77) 101/73 (09/15 1130) SpO2:  [62 %-100 %] 100 % (09/15 1130) FiO2 (%):  [80 %-100 %] 80 % (09/15 0500) Weight:  [39.4 kg] 39.4 kg (09/15 0400) Last BM Date : 09/10/24  Weight change: Filed Weights   09/09/24 0805 09/10/24 0500 09/11/24 0400  Weight: 38.3 kg 41.4 kg 39.4 kg   Intake/Output:   Intake/Output Summary (Last 24 hours) at 09/11/2024 1132 Last data filed at 09/11/2024 1115 Gross per 24 hour  Intake 1198.22 ml  Output --  Net 1198.22 ml    Physical Exam  General:  cachectic / frail appearing.   Neck: JVD ~8 cm.  Cor: Regular rate & irregular rhythm.  Lungs: clear Extremities: no edema  Neuro: drowsy. Affect flat.  Telemetry   A fib 70s (Personally reviewed)    Labs   Basic Metabolic Panel: Recent Labs  Lab 09/05/24 1410 09/06/24 0332 09/08/24 1724 09/10/24 0322 09/11/24 0040 09/11/24 0128 09/11/24 0416  NA 127* 132* 125* 125* 118* 120* 121*  K 4.1 4.2 4.8 4.1 5.6* 4.3 4.6  CL 88* 93* 87* 90* 83*  --  86*  CO2 24 25 21* 22 15*  --  20*  GLUCOSE 126* 91 93 95 54*  --  71  BUN 47* 30* 41* 36* 45*  --  49*  CREATININE 5.04* 3.78* 4.46* 3.60* 4.32*  --  4.43*  CALCIUM  9.3 9.4 8.9 8.8* 8.5*  --  8.7*  MG 2.3 2.2  --  2.3  --   --  2.2  PHOS 5.5* 4.3  --  5.1*  --   --  5.4*    Liver Function Tests: Recent Labs  Lab 09/05/24 1410 09/06/24 0332 09/08/24 1724  AST  --   --  42*  ALT  --   --  19  ALKPHOS  --   --  317*  BILITOT  --   --  2.1*  PROT  --   --  7.6  ALBUMIN  2.3* 2.4* 2.3*   No results for input(s): LIPASE, AMYLASE in the last 168 hours. No results for input(s): AMMONIA in the last 168 hours.  CBC: Recent Labs  Lab 09/05/24 1410 09/06/24 0332 09/08/24 1724 09/10/24 0322 09/11/24 0128  WBC 8.8 8.5 7.9 11.1*  --   NEUTROABS  --   --  5.5  --   --   HGB 11.7* 11.9* 11.1* 9.8* 12.2  HCT 35.2* 36.0 34.3* 29.9* 36.0  MCV 83.8 84.3 85.5 86.7   --   PLT 159 130* 111* 131*  --     Cardiac Enzymes: No results for input(s): CKTOTAL, CKMB, CKMBINDEX, TROPONINI in the last 168 hours.  BNP: BNP (last 3 results) Recent Labs    05/18/24 2210 08/31/24 0725 09/01/24 0413  BNP >4,500.0* >4,500.0* >4,500.0*    ProBNP (last 3 results) No results for input(s): PROBNP in the last 8760 hours.   CBG: Recent Labs  Lab 09/11/24 0435 09/11/24 0510 09/11/24 0750 09/11/24 0954 09/11/24 1047  GLUCAP 64* 118* 36* 30* 111*    Coagulation Studies: No results for input(s): LABPROT, INR in the last 72 hours.   Imaging   No results found.   Medications:     Current Medications:  amiodarone   400 mg Oral BID   Chlorhexidine  Gluconate Cloth  6 each Topical Daily   diphenhydrAMINE   25 mg Intravenous Once   feeding supplement (NEPRO CARB STEADY)  237 mL Oral TID BM   folic acid   1 mg Oral Daily   heparin   5,000 Units Subcutaneous Q8H   hydrocortisone  sod succinate (SOLU-CORTEF ) inj  100 mg Intravenous Q12H   midodrine   15 mg Oral TID WC   multivitamin  1 tablet Oral QHS   mupirocin  ointment  1 Application Nasal BID   mouth rinse  15 mL Mouth Rinse 4 times per day   mouth rinse  15 mL Mouth Rinse 4 times per day   oxidized cellulose  1 each Topical Once   sertraline   25 mg Oral  Daily   sevelamer  carbonate  3,200 mg Oral TID WC   [START ON 09/12/2024] thiamine   100 mg Oral Daily    Infusions:  sodium chloride      dextrose  10 % and 0.45 % NaCl 50 mL/hr at 09/11/24 1115    Patient Profile   [name] is a 43 y.o. 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 Admitted with multiorgan failure due to advanced lupus.   Assessment/Plan  Chronic hypotension 2/2 vasoplegia>>Multi system organ failure 2/2 advanced lupus - Continue midodrine  15 mg TID - SBP 60s-80s on calf -  Plan for a line today - Compression socks too large for her legs, pediatric? - Suspect abd binder would be too large as well.  - Overall poor prognosis with ongoing hypotension, frailty and comorbidities as she will be unable to tolerate iHD  HFrEF with RV failure - Echo 9/25: EF 30-35%, LV with GHK, RV severely reduced, LA/RA severely dilated, mod MR, severe TR - NYHA IV on admission - Volume management per iHD - No GDMT with ESRD and hypotension - She is not a candidate for advanced therapies with numerous comorbidities, hx cocaine  use and severe deconditioning/frailty  ESRD Lupus nephritis - Has been having difficulty tolerating iHD. Has required levo in the past - Only 400 ccs removed during last session - Continue midodrine  15 mg TID  Valvular heart disease - Echo 9/25 with mod MR, severe TR and mod-severe aortic valve regurgitation - Not a candidate for intervention with frailty  A fib/flutter - A fib 70s on tele - Decrease amiodarone  400>200 mg BID - off AC with hx of SDH and recurrent falls  Hx cocaine  dependence - last + 5/25  Respiratory failure - required BiPAP 9/12 - Stable on [place] now - PCCM following  Cachexia/ Frailty Protein malnutrition>>Hypoglycemia Deconditioning - per primary team - Palliative care last saw 9/11, at that time patient wished for full code and full scope of care. Discussions ongoing.  - Now requiring D10  CRITICAL CARE Performed by: Beckey LITTIE Coe  Total critical care time: 18 minutes  Critical care time was exclusive of separately billable procedures and treating other patients.  Critical care was necessary to treat or prevent imminent or life-threatening deterioration.  Critical care was time spent personally by me on the following activities: development of treatment plan with patient and/or surrogate as well as nursing, discussions with consultants, evaluation of patient's response to treatment, examination of patient, obtaining  history from patient or surrogate, ordering and performing treatments and interventions, ordering and review of laboratory studies, ordering and review of radiographic studies, pulse oximetry and re-evaluation of patient's condition.   Length of Stay: 11  Beckey LITTIE Coe, NP  09/11/2024, 11:32 AM   Advanced Heart Failure Team Pager [phone] (M-F; 7a - 5p)  Please contact [facility] for night-coverage after hours (4p -7a ) and weekends on [email or web address]

## 2024-09-11 NOTE — Procedures (Signed)
 Arterial Catheter Insertion Procedure Note  [name]  [id number]  [birth year]  Date:09/11/24  Time:12:45 PM    Provider Performing: Toribio JAYSON Sharps    Procedure: Insertion of Arterial Line (63379) with US  guidance (23062)   Indication(s) Blood pressure monitoring and/or need for frequent ABGs  Consent Risks of the procedure as well as the alternatives and risks of each were explained to the patient and/or caregiver.  Consent for the procedure was obtained and is signed in the bedside chart  Anesthesia None   Time Out Verified patient identification, verified procedure, site/side was marked, verified correct patient position, special equipment/implants available, medications/allergies/relevant history reviewed, required imaging and test results available.   Sterile Technique Maximal sterile technique including full sterile barrier drape, hand hygiene, sterile gown, sterile gloves, mask, hair covering, sterile ultrasound probe cover (if used).   Procedure Description Area of catheter insertion was cleaned with chlorhexidine  and draped in sterile fashion. With real-time ultrasound guidance an arterial catheter was placed into the left radial artery.  Appropriate arterial tracings confirmed on monitor.     Complications/Tolerance Low BP expected given proximal failed AV graft Will use for cbg and blood draws.   EBL Minimal   Specimen(s) None

## 2024-09-11 NOTE — Progress Notes (Signed)
 RT X 2 attempted A-line without success. RN and MD notified.

## 2024-09-11 NOTE — Progress Notes (Signed)
 [facility] Progress Note   Subjective:    Seen by CHF team today.  CCM and CHF say for iHD we should watch mentation, HR and keep SBP > 80  Objective Vitals:   09/11/24 1445 09/11/24 1500 09/11/24 1515 09/11/24 1530  BP: (!) 100/13 (!) 114/41 (!) 128/43 (!) 93/37  Pulse: 79 79 78 76  Resp: (!) 25 16 (!) 27 (!) 22  Temp:      TempSrc:      SpO2: (!) 86% 100% 100% 97%  Weight:      Height:       Physical Exam General: Awake, alert, NAD, cachectic, ill-appearing, on HFNC Heart: RRR; No MRGs Lungs: normal WOB on HFNC Abdomen: Soft and non-tender Extremities: No LE edema Dialysis Access: AVF +t/b - weak but present   Dialysis Orders: 3h  B400   37.5kg   2K bath  AVF  Heparin  none Post 9/02 wt 38.2kg, usual comes off 0-1.5kg over Hb 12, no esa   Home bp meds: Midodrine  10 tid  Assessment/Plan: SOB/ pulm edema: razor thin margin between hypo and hypervolemia in setting of end stage cardiac disease.  Went from RA to bipap with 0.5L bolus then to HFNC with 0.4L UF with HD yesterday.  Plan for HD with UF today as tolerated.   ESRD: on HD TTS. Urgent HD Sat AM per above - tolerated 0.4L UF.  Will perform ongoing HD but would not use pressors or CRRT if she does not tolerate HD as this is equivalent to futile care.  This AM she consents to ongoing attempts at HD but is well informed of the issues encountered with hypotension secondary to cardiac compromise.  At some point, likely soon, suspect she will no longer be able to tolerate HD and she has been informed of this.   Vascular Access: Noted AVF started bleeding on 9/8 after venous needle was pulled. VVS on board. A stitch, manual pressure, and dressing were applied. No issues since then. Have an experienced cannulator stick her moving forward. Catheter would be next access. Hypotension: Cardiac and/or vasoplegia in origin. Midodrine  continuing here, pre and mid doses but as above no plans to escalate to pressors  or CRRT Volume: is 2-3kg over now, which prob is inevitable given our difficulties getting fluid off  Hypoglycemia: 2nd poor PO intake. Has been on/off dextrose  supplement during this admission Anemia of esrd: Hb 12-15, not on esa at OP unit Secondary hyperPTH: no acute issue Chronic systolic HF/  RV dysfunction/ valve disease: not a candidate for surgical or other invasive therapies due to ongoing cocaine  and high risk  A fib/FL: on amio, no BB due to hypotension, no anticoag due to h/o subdural hematoma.  GOC: Pall care following and well known to patient - cont full scope but as above we are approaching a point of inability to safely provide dialysis/ultrafiltration due to end stage cardiac disease.    Myer Fret  MD  [facility] 09/11/2024, 3:46 PM  Recent Labs  Lab 09/06/24 0332 09/08/24 1724 09/10/24 0322 09/11/24 0040 09/11/24 0128 09/11/24 0416 09/11/24 0955  HGB 11.9* 11.1* 9.8*  --  12.2  --  12.2  ALBUMIN  2.4* 2.3*  --   --   --   --   --   CALCIUM  9.4 8.9 8.8* 8.5*  --  8.7*  --   PHOS 4.3  --  5.1*  --   --  5.4*  --   CREATININE 3.78* 4.46* 3.60*  4.32*  --  4.43*  --   K 4.2 4.8 4.1 5.6* 4.3 4.6 4.9    Inpatient medications:  amiodarone   200 mg Oral BID   Chlorhexidine  Gluconate Cloth  6 each Topical Daily   feeding supplement (NEPRO CARB STEADY)  237 mL Oral TID BM   feeding supplement (PROSource TF20)  60 mL Per Tube Daily   folic acid   1 mg Oral Daily   heparin   5,000 Units Subcutaneous Q8H   hydrocortisone  sod succinate (SOLU-CORTEF ) inj  100 mg Intravenous Q12H   midodrine   15 mg Oral TID WC   multivitamin  1 tablet Oral QHS   mupirocin  ointment  1 Application Nasal BID   mouth rinse  15 mL Mouth Rinse 4 times per day   mouth rinse  15 mL Mouth Rinse 4 times per day   sertraline   25 mg Oral Daily   sevelamer  carbonate  3,200 mg Oral TID WC   [START ON 09/12/2024] thiamine   100 mg Oral Daily    sodium chloride      dextrose  10 % and 0.45 % NaCl 50 mL/hr at  09/11/24 1500   feeding supplement (VITAL AF 1.2 CAL)     Place/Maintain arterial line **AND** sodium chloride , acetaminophen , alteplase , docusate sodium , heparin , lidocaine  (PF), lidocaine -prilocaine , LORazepam , melatonin, midodrine , naLOXone  (NARCAN )  injection, mouth rinse, mouth rinse, oxyCODONE  **OR** oxyCODONE , pentafluoroprop-tetrafluoroeth, polyethylene glycol

## 2024-09-11 NOTE — TOC Progression Note (Signed)
 Transition of Care [facility]) - Progression Note    Patient Details  Name: [name] MRN: [id number] Date of Birth: 05-18-1981  [facility] CM/SW Contact  Isaiah Public, LCSWA Phone Number: 09/11/2024, 3:51 PM  Clinical Narrative:     CSW currently following. CSW plans to follow up with patient on SNF bed offers/dc plan closer to patient being medically ready for dc.   Expected Discharge Plan: Home/Self Care Barriers to Discharge: Continued Medical Work up               Expected Discharge Plan and Services       Living arrangements for the past 2 months: Single Family Home                                       Social Drivers of Health (SDOH) Interventions SDOH Screenings   Food Insecurity: Patient Declined (09/04/2024)  Housing: Low Risk  (05/20/2024)  Transportation Needs: No Transportation Needs (05/20/2024)  Utilities: Not At Risk (05/20/2024)  Alcohol  Screen: Low Risk  (05/31/2024)  Depression (PHQ2-9): High Risk (05/31/2024)  Financial Resource Strain: Low Risk  (05/31/2024)  Physical Activity: Inactive (05/31/2024)  Social Connections: Socially Isolated (05/31/2024)  Stress: Stress Concern Present (05/31/2024)  Tobacco Use: High Risk (09/05/2024)  Health Literacy: Adequate Health Literacy (05/31/2024)    Readmission Risk Interventions    05/22/2024   11:49 AM 04/17/2022    8:36 AM  Readmission Risk Prevention Plan  Transportation Screening Complete Complete  PCP or Specialist Appt within 3-5 Days  Complete  HRI or Home Care Consult  Complete  Social Work Consult for Recovery Care Planning/Counseling  Complete  Palliative Care Screening  Not Applicable  Medication Review Oceanographer) Complete Complete  PCP or Specialist appointment within 3-5 days of discharge Complete   HRI or Home Care Consult Complete   Palliative Care Screening Not Applicable   Skilled Nursing Facility Not Applicable

## 2024-09-11 NOTE — Progress Notes (Signed)
 RT was called due to pt pulse ox/desatting. When RT arrived pt was on a HFNC [place] @ 11L. Placed PT on BIPAP and obtained ABG. Sats 100.

## 2024-09-12 ENCOUNTER — Encounter (HOSPITAL_COMMUNITY): Admission: EM | Disposition: E | Payer: Self-pay | Source: Home / Self Care | Attending: Pulmonary Disease

## 2024-09-12 ENCOUNTER — Inpatient Hospital Stay (HOSPITAL_COMMUNITY)

## 2024-09-12 DIAGNOSIS — I959 Hypotension, unspecified: Secondary | ICD-10-CM | POA: Diagnosis not present

## 2024-09-12 DIAGNOSIS — N186 End stage renal disease: Secondary | ICD-10-CM | POA: Diagnosis not present

## 2024-09-12 DIAGNOSIS — E162 Hypoglycemia, unspecified: Secondary | ICD-10-CM | POA: Diagnosis not present

## 2024-09-12 DIAGNOSIS — J810 Acute pulmonary edema: Secondary | ICD-10-CM | POA: Diagnosis not present

## 2024-09-12 DIAGNOSIS — I5043 Acute on chronic combined systolic (congestive) and diastolic (congestive) heart failure: Secondary | ICD-10-CM | POA: Diagnosis not present

## 2024-09-12 DIAGNOSIS — I5082 Biventricular heart failure: Secondary | ICD-10-CM | POA: Diagnosis not present

## 2024-09-12 HISTORY — PX: RIGHT HEART CATH: CATH118263

## 2024-09-12 LAB — RENAL FUNCTION PANEL
Albumin: 2 g/dL — ABNORMAL LOW (ref 3.5–5.0)
Albumin: 1.9 g/dL — ABNORMAL LOW (ref 3.5–5.0)
Anion gap: 18 — ABNORMAL HIGH (ref 5–15)
Anion gap: 17 — ABNORMAL HIGH (ref 5–15)
BUN: 45 mg/dL — ABNORMAL HIGH (ref 6–20)
BUN: 50 mg/dL — ABNORMAL HIGH (ref 6–20)
CO2: 15 mmol/L — ABNORMAL LOW (ref 22–32)
CO2: 16 mmol/L — ABNORMAL LOW (ref 22–32)
Calcium: 9 mg/dL (ref 8.9–10.3)
Calcium: 8.9 mg/dL (ref 8.9–10.3)
Chloride: 87 mmol/L — ABNORMAL LOW (ref 98–111)
Chloride: 92 mmol/L — ABNORMAL LOW (ref 98–111)
Creatinine, Ser: 4.04 mg/dL — ABNORMAL HIGH (ref 0.44–1.00)
Creatinine, Ser: 3.77 mg/dL — ABNORMAL HIGH (ref 0.44–1.00)
GFR, Estimated: 14 mL/min — ABNORMAL LOW (ref >60)
GFR, Estimated: 15 mL/min — ABNORMAL LOW (ref >60)
Glucose, Bld: 143 mg/dL — ABNORMAL HIGH (ref 70–99)
Glucose, Bld: 101 mg/dL — ABNORMAL HIGH (ref 70–99)
Phosphorus: 6 mg/dL — ABNORMAL HIGH (ref 2.5–4.6)
Phosphorus: 5.2 mg/dL — ABNORMAL HIGH (ref 2.5–4.6)
Potassium: 4.6 mmol/L (ref 3.5–5.1)
Potassium: 4.7 mmol/L (ref 3.5–5.1)
Sodium: 120 mmol/L — ABNORMAL LOW (ref 135–145)
Sodium: 125 mmol/L — ABNORMAL LOW (ref 135–145)

## 2024-09-12 LAB — MAGNESIUM: Magnesium: 2.4 mg/dL (ref 1.7–2.4)

## 2024-09-12 LAB — POCT I-STAT 7, (LYTES, BLD GAS, ICA,H+H)
Acid-base deficit: 3 mmol/L — ABNORMAL HIGH (ref 0.0–2.0)
Acid-base deficit: 6 mmol/L — ABNORMAL HIGH (ref 0.0–2.0)
Bicarbonate: 21.8 mmol/L (ref 20.0–28.0)
Bicarbonate: 19 mmol/L — ABNORMAL LOW (ref 20.0–28.0)
Calcium, Ion: 1.17 mmol/L (ref 1.15–1.40)
Calcium, Ion: 1.16 mmol/L (ref 1.15–1.40)
HCT: 34 % — ABNORMAL LOW (ref 36.0–46.0)
HCT: 37 % (ref 36.0–46.0)
Hemoglobin: 11.6 g/dL — ABNORMAL LOW (ref 12.0–15.0)
Hemoglobin: 12.6 g/dL (ref 12.0–15.0)
O2 Saturation: 96 %
O2 Saturation: 93 %
Patient temperature: 97.1
Potassium: 5.2 mmol/L — ABNORMAL HIGH (ref 3.5–5.1)
Potassium: 4.9 mmol/L (ref 3.5–5.1)
Sodium: 124 mmol/L — ABNORMAL LOW (ref 135–145)
Sodium: 125 mmol/L — ABNORMAL LOW (ref 135–145)
TCO2: 23 mmol/L (ref 22–32)
TCO2: 20 mmol/L — ABNORMAL LOW (ref 22–32)
pCO2 arterial: 37.4 mmHg (ref 32–48)
pCO2 arterial: 36.5 mmHg (ref 32–48)
pH, Arterial: 7.37 (ref 7.35–7.45)
pH, Arterial: 7.325 — ABNORMAL LOW (ref 7.35–7.45)
pO2, Arterial: 80 mmHg — ABNORMAL LOW (ref 83–108)
pO2, Arterial: 73 mmHg — ABNORMAL LOW (ref 83–108)

## 2024-09-12 LAB — POCT I-STAT EG7
Acid-base deficit: 4 mmol/L — ABNORMAL HIGH (ref 0.0–2.0)
Acid-base deficit: 4 mmol/L — ABNORMAL HIGH (ref 0.0–2.0)
Bicarbonate: 23 mmol/L (ref 20.0–28.0)
Bicarbonate: 23.1 mmol/L (ref 20.0–28.0)
Calcium, Ion: 1.19 mmol/L (ref 1.15–1.40)
Calcium, Ion: 1.2 mmol/L (ref 1.15–1.40)
HCT: 37 % (ref 36.0–46.0)
HCT: 38 % (ref 36.0–46.0)
Hemoglobin: 12.6 g/dL (ref 12.0–15.0)
Hemoglobin: 12.9 g/dL (ref 12.0–15.0)
O2 Saturation: 29 %
O2 Saturation: 29 %
Potassium: 4.9 mmol/L (ref 3.5–5.1)
Potassium: 5 mmol/L (ref 3.5–5.1)
Sodium: 125 mmol/L — ABNORMAL LOW (ref 135–145)
Sodium: 125 mmol/L — ABNORMAL LOW (ref 135–145)
TCO2: 24 mmol/L (ref 22–32)
TCO2: 25 mmol/L (ref 22–32)
pCO2, Ven: 50 mmHg (ref 44–60)
pCO2, Ven: 51 mmHg (ref 44–60)
pH, Ven: 7.264 (ref 7.25–7.43)
pH, Ven: 7.271 (ref 7.25–7.43)
pO2, Ven: 22 mmHg — CL (ref 32–45)
pO2, Ven: 22 mmHg — CL (ref 32–45)

## 2024-09-12 LAB — GLUCOSE, CAPILLARY
Glucose-Capillary: 110 mg/dL — ABNORMAL HIGH (ref 70–99)
Glucose-Capillary: 118 mg/dL — ABNORMAL HIGH (ref 70–99)
Glucose-Capillary: 131 mg/dL — ABNORMAL HIGH (ref 70–99)
Glucose-Capillary: 144 mg/dL — ABNORMAL HIGH (ref 70–99)
Glucose-Capillary: 85 mg/dL (ref 70–99)
Glucose-Capillary: 89 mg/dL (ref 70–99)

## 2024-09-12 LAB — CBC
HCT: 31.4 % — ABNORMAL LOW (ref 36.0–46.0)
Hemoglobin: 10.2 g/dL — ABNORMAL LOW (ref 12.0–15.0)
MCH: 28.2 pg (ref 26.0–34.0)
MCHC: 32.5 g/dL (ref 30.0–36.0)
MCV: 86.7 fL (ref 80.0–100.0)
Platelets: 185 K/uL (ref 150–400)
RBC: 3.62 MIL/uL — ABNORMAL LOW (ref 3.87–5.11)
RDW: 19.9 % — ABNORMAL HIGH (ref 11.5–15.5)
WBC: 19.5 K/uL — ABNORMAL HIGH (ref 4.0–10.5)
nRBC: 0.7 % — ABNORMAL HIGH (ref 0.0–0.2)

## 2024-09-12 SURGERY — RIGHT HEART CATH
Anesthesia: LOCAL

## 2024-09-12 MED ORDER — HEPARIN (PORCINE) IN NACL 1000-0.9 UT/500ML-% IV SOLN
INTRAVENOUS | Status: DC | PRN
  Administered 2024-09-12: 500 mL

## 2024-09-12 MED ORDER — HEPARIN SODIUM (PORCINE) 1000 UNIT/ML DIALYSIS
1000.0000 [IU] | INTRAMUSCULAR | Status: DC | PRN
  Administered 2024-09-14: 2800 [IU] via INTRAVENOUS_CENTRAL
  Filled 2024-09-12: qty 6
  Filled 2024-09-12: qty 3

## 2024-09-12 MED ORDER — LIDOCAINE HCL (PF) 1 % IJ SOLN
INTRAMUSCULAR | Status: AC
  Filled 2024-09-12: qty 30

## 2024-09-12 MED ORDER — FENTANYL CITRATE (PF) 100 MCG/2ML IJ SOLN
INTRAMUSCULAR | Status: AC
  Filled 2024-09-12: qty 2

## 2024-09-12 MED ORDER — NOREPINEPHRINE 4 MG/250ML-% IV SOLN
0.0000 ug/min | INTRAVENOUS | Status: DC
  Filled 2024-09-12: qty 250

## 2024-09-12 MED ORDER — SODIUM CHLORIDE 0.9 % IV SOLN: 250.0000 [IU]/h | INTRAVENOUS | Status: DC

## 2024-09-12 MED ORDER — LIDOCAINE HCL (PF) 1 % IJ SOLN
INTRAMUSCULAR | Status: DC | PRN
  Administered 2024-09-12: 5 mL

## 2024-09-12 MED ORDER — THIAMINE HCL 100 MG/ML IJ SOLN
500.0000 mg | Freq: Three times a day (TID) | INTRAVENOUS | Status: AC
  Administered 2024-09-12 – 2024-09-14 (×6): 500 mg via INTRAVENOUS
  Filled 2024-09-12 (×2): qty 5
  Filled 2024-09-12: qty 500
  Filled 2024-09-12 (×2): qty 5
  Filled 2024-09-12: qty 500

## 2024-09-12 MED ORDER — PRISMASOL BGK 4/2.5 32-4-2.5 MEQ/L EC SOLN: Status: DC

## 2024-09-12 MED ORDER — FREE WATER
10.0000 mL | Freq: Once | Status: AC
  Administered 2024-09-12: 10 mL via ORAL

## 2024-09-12 MED ORDER — THIAMINE HCL 100 MG/ML IJ SOLN
100.0000 mg | INTRAMUSCULAR | Status: DC
Start: 2024-09-17 — End: 2024-09-19
  Administered 2024-09-17 – 2024-09-18 (×2): 100 mg via INTRAVENOUS
  Filled 2024-09-12 (×2): qty 2

## 2024-09-12 MED ORDER — DEXTROSE-SODIUM CHLORIDE 10-0.45 % IV SOLN
INTRAVENOUS | Status: DC
  Filled 2024-09-12 (×3): qty 1000

## 2024-09-12 MED ORDER — THIAMINE HCL 100 MG/ML IJ SOLN
250.0000 mg | Freq: Three times a day (TID) | INTRAMUSCULAR | Status: AC
  Administered 2024-09-14 – 2024-09-17 (×9): 250 mg via INTRAVENOUS
  Filled 2024-09-12 (×9): qty 2.5

## 2024-09-12 MED ORDER — FENTANYL CITRATE (PF) 100 MCG/2ML IJ SOLN
INTRAMUSCULAR | Status: DC | PRN
  Administered 2024-09-12: 25 ug via INTRAVENOUS

## 2024-09-12 SURGICAL SUPPLY — 9 items
CATH SWAN GANZ 7F STRAIGHT (CATHETERS) IMPLANT
ELECT DEFIB PAD ADLT CADENCE (PAD) IMPLANT
KIT ARTERIAL CATH 20G 4.45CM (CATHETERS) IMPLANT
KIT MICROPUNCTURE NIT STIFF (SHEATH) IMPLANT
PACK CARDIAC CATHETERIZATION (CUSTOM PROCEDURE TRAY) ×2 IMPLANT
SHEATH PINNACLE 7F 10CM (SHEATH) IMPLANT
TRANSDUCER W/STOPCOCK (MISCELLANEOUS) IMPLANT
TUBING ART PRESS 72 MALE/FEM (TUBING) IMPLANT
WIRE EMERALD 3MM-J .025X260CM (WIRE) IMPLANT

## 2024-09-12 NOTE — Progress Notes (Signed)
 Advanced Heart Failure Rounding Note  Cardiologist: Shelda Bruckner, MD  Chief Complaint: Multi system organ failure 2/2 advanced lupus  Subjective:    1.3L removed yesterday with iHD. Did not require NE. Tolerated fairly well.   Resting in bed. Lethargic. Minimally interactive.   Objective:   Weight Range: 39.2 kg Body mass index is 15.81 kg/m.   Vital Signs:   Temp:  [96.1 F (35.6 C)-98.2 F (36.8 C)] 97.1 F (36.2 C) (09/16 0700) Pulse Rate:  [69-121] 89 (09/16 0600) Resp:  [11-35] 18 (09/16 0600) BP: (56-142)/(13-102) 132/78 (09/16 0600) SpO2:  [72 %-100 %] 96 % (09/16 0600) Weight:  [39 kg-40 kg] 39.2 kg (09/16 0500) Last BM Date : 09/11/24  Weight change: Filed Weights   09/11/24 1400 09/11/24 1910 09/12/24 0500  Weight: 40 kg 39 kg 39.2 kg    Intake/Output:   Intake/Output Summary (Last 24 hours) at 09/12/2024 0903 Last data filed at 09/12/2024 0408 Gross per 24 hour  Intake 1339.45 ml  Output 1300 ml  Net 39.45 ml    Physical Exam  General:  chronically ill appearing. Frail, cachetic.  Neck: JVD elevated.  Cor: Regular rate & irregular rhythm.  Lungs: clear Extremities: no edema  Neuro: drowsy. Affect flat.  Telemetry   A fib/flutter 80s (Personally reviewed)    Labs    CBC Recent Labs    09/10/24 0322 09/11/24 0128 09/11/24 0955 09/12/24 0349  WBC 11.1*  --   --  19.5*  HGB 9.8*   < > 12.2 10.2*  HCT 29.9*   < > 36.0 31.4*  MCV 86.7  --   --  86.7  PLT 131*  --   --  185   < > = values in this interval not displayed.   Basic Metabolic Panel Recent Labs    90/84/74 0416 09/11/24 0955 09/12/24 0349  NA 121* 122* 120*  K 4.6 4.9 4.6  CL 86*  --  87*  CO2 20*  --  15*  GLUCOSE 71  --  143*  BUN 49*  --  45*  CREATININE 4.43*  --  4.04*  CALCIUM  8.7*  --  9.0  MG 2.2  --  2.4  PHOS 5.4*  --  6.0*   Liver Function Tests Recent Labs    09/12/24 0349  ALBUMIN  2.0*   No results for input(s): LIPASE, AMYLASE in  the last 72 hours. Cardiac Enzymes No results for input(s): CKTOTAL, CKMB, CKMBINDEX, TROPONINI in the last 72 hours.  BNP: BNP (last 3 results) Recent Labs    05/18/24 2210 08/31/24 0725 09/01/24 0413  BNP >4,500.0* >4,500.0* >4,500.0*    ProBNP (last 3 results) No results for input(s): PROBNP in the last 8760 hours.   D-Dimer No results for input(s): DDIMER in the last 72 hours. Hemoglobin A1C No results for input(s): HGBA1C in the last 72 hours. Fasting Lipid Panel No results for input(s): CHOL, HDL, LDLCALC, TRIG, CHOLHDL, LDLDIRECT in the last 72 hours. Thyroid Function Tests No results for input(s): TSH, T4TOTAL, T3FREE, THYROIDAB in the last 72 hours.  Invalid input(s): FREET3  Other results:   Imaging    No results found.   Medications:     Scheduled Medications:  amiodarone   200 mg Oral BID   Chlorhexidine  Gluconate Cloth  6 each Topical Daily   feeding supplement (NEPRO CARB STEADY)  237 mL Oral TID BM   feeding supplement (PROSource TF20)  60 mL Per Tube TID   folic acid   1 mg Oral Daily   free water   10 mL Oral Once   heparin   5,000 Units Subcutaneous Q8H   hydrocortisone  sod succinate (SOLU-CORTEF ) inj  100 mg Intravenous Q12H   midodrine   15 mg Oral TID WC   multivitamin  1 tablet Oral QHS   mupirocin  ointment  1 Application Nasal BID   mouth rinse  15 mL Mouth Rinse 4 times per day   sertraline   25 mg Oral Daily   sevelamer  carbonate  3,200 mg Oral TID WC   thiamine   100 mg Oral Daily    Infusions:  feeding supplement (VITAL AF 1.2 CAL) 20 mL/hr at 09/12/24 0401    PRN Medications: acetaminophen , alteplase , docusate sodium , heparin , lidocaine  (PF), lidocaine -prilocaine , LORazepam , melatonin, midodrine , naLOXone  (NARCAN )  injection, mouth rinse, oxyCODONE  **OR** oxyCODONE , pentafluoroprop-tetrafluoroeth, polyethylene glycol    Patient Profile  [name] is a 43 y.o. 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 Admitted with multiorgan failure due to advanced lupus.   Assessment/Plan  Chronic hypotension 2/2 vasoplegia>>Multi system organ failure 2/2 advanced lupus - Continue midodrine  15 mg TID - SBP better today - A lines have been unsuccessful  - Compression socks too large for her legs, peds called and they do not have smaller ones - Suspect abd binder would be too large as well.  - Overall poor prognosis with ongoing hypotension, frailty and comorbidities as she may be unable to tolerate iHD - Plan RHC today to rule out high output state   HFrEF with RV failure - Echo 9/25: EF 30-35%, LV with GHK, RV severely reduced, LA/RA severely dilated, mod MR, severe TR - NYHA IV on admission - Volume management per iHD - No GDMT with ESRD and hypotension - She is not a candidate for advanced therapies with numerous comorbidities, hx cocaine  use and severe deconditioning/frailty now in MSOF   ESRD Lupus nephritis - Has been having difficulty tolerating iHD. Has required levo in the past - Tolerated iHD yesterday - Continue midodrine  15 mg TID   Valvular heart disease - Echo 9/25 with mod MR, severe TR and mod-severe aortic valve regurgitation - Not a candidate for intervention with frailty   A fib/flutter - A fib 80s on tele - Continue amiodarone  200 mg BID - off AC with hx of SDH and recurrent falls   Hx cocaine  dependence - last + 5/25   Respiratory failure - required BiPAP 9/12 - Stable on [place] now - PCCM following   Cachexia/ Frailty Protein malnutrition>>Hypoglycemia Deconditioning - per primary team - Palliative care last saw 9/11, at that time patient wished for full code and full scope of care. Discussions ongoing.  - Now with cortrak  CRITICAL CARE Performed by: Beckey LITTIE Coe   Total critical care time: 15  minutes  Critical care time was exclusive of separately billable procedures and treating other patients.  Critical care was necessary to treat or prevent imminent or life-threatening deterioration.  Critical care was time spent personally by me on the following activities: development of treatment plan with patient and/or surrogate as well as nursing, discussions with consultants, evaluation of patient's response to treatment, examination of patient, obtaining history from patient or surrogate, ordering and performing treatments and interventions, ordering and review of laboratory studies, ordering and review of radiographic studies, pulse oximetry and re-evaluation of patient's condition.   Length of Stay: 12  Beckey LITTIE Coe, NP  09/12/2024, 9:03 AM  Advanced Heart Failure Team Pager [phone] (M-F; 7a - 5p)  Please contact [facility] for night-coverage after hours (5p -7a ) and weekends on [email or web address]

## 2024-09-12 NOTE — Procedures (Signed)
 I have reviewed the CRRT procedure and made adjustments as needed.  Myer Fret MD  [facility] 09/12/2024, 4:30 PM

## 2024-09-12 NOTE — Interval H&P Note (Signed)
 History and Physical Interval Note:  09/12/2024 11:20 AM  [name]  has presented today for surgery, with the diagnosis of hf.  The various methods of treatment have been discussed with the patient and family. After consideration of risks, benefits and other options for treatment, the patient has consented to  Procedure(s): RIGHT HEART CATH (N/A) as a surgical intervention.  The patient's history has been reviewed, patient examined, no change in status, stable for surgery.  I have reviewed the patient's chart and labs.  Questions were answered to the patient's satisfaction.     Dametri Ozburn

## 2024-09-12 NOTE — IPAL (Signed)
  Interdisciplinary Goals of Care Family Meeting   Date carried out: 09/12/2024  Location of the meeting: Bedside  Member's involved: Physician, Family Member or next of kin, and Other: Deward Eastern NP  Durable Power of Insurance risk surveyor: Patient and     Discussion: We discussed goals of care for [employer] .    Discussed findings on RHC. Discussed only potential path forward would be attempting to achieve euvolemia with time-limited trial of CRRT. If pressor needs get too high trying to remove fluid, we are unfortunately out of options. If we get to euvolemia and at that state the heart is still too weak to get off pressors, we are out of options. Discussed futility of chest compressions in event of cardiac arrest and associated suffering. She and family agree that we push forward with this trial but allow peaceful passing in event of death. Further GOC discussions pending how she does on CRRT.  Code status: DNR limited  Disposition: See above  Time spent for the meeting: 15 mins    Toribio JAYSON Sharps, MD  09/12/2024, 2:09 PM

## 2024-09-12 NOTE — Consult Note (Addendum)
 WOC Nurse Consult Note: Reason for Consult: L forearm  Wound type: full thickness L forearm ? Etiology (per bedside nurse no known infiltrate in this area)  Patient is end stage renal ? Calciphylaxis ?  Pressure Injury POA:NA  Measurement: see nursing flowsheet  Wound bed: dark almost eschar in appearance  Drainage (amount, consistency, odor) per nursing flowsheet  Periwound: intact  Dressing procedure/placement/frequency:  Cleanse L forearm wound with Vashe, do not rinse and allow to air dry.  Apply Xeroform gauze ([place] [phone]) to area daily, cover with Telfa nonstick pad and secure with silicone foam or Kerlix roll gauze whichever is preferred.   POC discussed with bedside nurse. WOC team will not follow. Re-consult if further needs arise.   Thank you,    Powell Bar MSN, RN-BC, [employer]

## 2024-09-12 NOTE — Plan of Care (Signed)
  Problem: Clinical Measurements: Goal: Ability to maintain clinical measurements within normal limits will improve Outcome: Progressing Goal: Will remain free from infection Outcome: Progressing Goal: Diagnostic test results will improve Outcome: Progressing Goal: Respiratory complications will improve Outcome: Progressing Goal: Cardiovascular complication will be avoided Outcome: Progressing   Problem: Activity: Goal: Risk for activity intolerance will decrease Outcome: Progressing   Problem: Nutrition: Goal: Adequate nutrition will be maintained Outcome: Progressing   Problem: Coping: Goal: Level of anxiety will decrease Outcome: Progressing   Problem: Elimination: Goal: Will not experience complications related to bowel motility Outcome: Progressing Goal: Will not experience complications related to urinary retention Outcome: Progressing   Problem: Pain Managment: Goal: General experience of comfort will improve and/or be controlled Outcome: Progressing   Problem: Safety: Goal: Ability to remain free from injury will improve Outcome: Progressing   Problem: Skin Integrity: Goal: Risk for impaired skin integrity will decrease Outcome: Progressing   Problem: Education: Goal: Individualized Educational Video(s) Outcome: Progressing   Problem: Fluid Volume: Goal: Compliance with measures to maintain balanced fluid volume will improve Outcome: Progressing   Problem: Health Behavior/Discharge Planning: Goal: Ability to manage health-related needs will improve Outcome: Progressing   Problem: Nutritional: Goal: Ability to make healthy dietary choices will improve Outcome: Progressing   Problem: Clinical Measurements: Goal: Complications related to the disease process, condition or treatment will be avoided or minimized Outcome: Progressing   Problem: Education: Goal: Ability to describe self-care measures that may prevent or decrease complications (Diabetes  Survival Skills Education) will improve Outcome: Progressing Goal: Individualized Educational Video(s) Outcome: Progressing   Problem: Coping: Goal: Ability to adjust to condition or change in health will improve Outcome: Progressing   Problem: Fluid Volume: Goal: Ability to maintain a balanced intake and output will improve Outcome: Progressing   Problem: Health Behavior/Discharge Planning: Goal: Ability to identify and utilize available resources and services will improve Outcome: Progressing Goal: Ability to manage health-related needs will improve Outcome: Progressing   Problem: Metabolic: Goal: Ability to maintain appropriate glucose levels will improve Outcome: Progressing   Problem: Nutritional: Goal: Maintenance of adequate nutrition will improve Outcome: Progressing Goal: Progress toward achieving an optimal weight will improve Outcome: Progressing   Problem: Skin Integrity: Goal: Risk for impaired skin integrity will decrease Outcome: Progressing   Problem: Tissue Perfusion: Goal: Adequacy of tissue perfusion will improve Outcome: Progressing   Problem: Education: Goal: Ability to demonstrate management of disease process will improve Outcome: Progressing Goal: Ability to verbalize understanding of medication therapies will improve Outcome: Progressing Goal: Individualized Educational Video(s) Outcome: Progressing   Problem: Activity: Goal: Capacity to carry out activities will improve Outcome: Progressing   Problem: Cardiac: Goal: Ability to achieve and maintain adequate cardiopulmonary perfusion will improve Outcome: Progressing

## 2024-09-12 NOTE — Progress Notes (Signed)
 [facility] Progress Note   Subjective:    Had RHC this am, showing excess volume    Objective Vitals:   09/12/24 1141 09/12/24 1146 09/12/24 1151 09/12/24 1156  BP: 132/69 139/62 (!) 133/58 (!) 128/53  Pulse: 88 (!) 133 93 90  Resp: (!) 27 (!) 22 (!) 32 (!) 34  Temp:      TempSrc:      SpO2:      Weight:      Height:       Physical Exam General: Awake, alert, NAD, cachectic, ill-appearing, on HFNC Heart: RRR; No MRGs Lungs: normal WOB on HFNC Abdomen: Soft and non-tender Extremities: No LE edema Dialysis Access: AVF +t/b - weak but present   Dialysis Orders: 3h  B400   37.5kg   2K bath  AVF  Heparin  none Post 9/02 wt 38.2kg, usual comes off 0-1.5kg over Hb 12, no esa   Home bp meds: Midodrine  10 tid  Assessment/Plan: SOB/ pulm edema: thin margin between hypo and hypervolemia in setting of advanced cardiac disease.  iHD on hold now during CRRT trial.  ESRD: on HD TTS. Has HD yest w/ 1.3 L off. CCM planning for 24 hr CRRT trial w/ pressor and inotrope support, orders are in.   Vascular Access: Note the AVF bled on 9/8 after venous needle was pulled. VVS assisted. Have an experienced cannulator stick her moving forward. Catheter would be next access. Hypotension: Cardiac and/or vasoplegia in origin. Midodrine  continuing here, pre and mid doses.  Volume: is 2 kg over now, will see how 24 hr trial of CRRT does to get volume down  Anemia of esrd: Hb 12-15, not on esa at OP unit Secondary hyperPTH: no acute issue Chronic systolic HF/  RV dysfunction/ valve disease: not a candidate for surgical or other invasive therapies due to ongoing cocaine  and high risk  A fib/FL: on amio, no BB due to hypotension, no anticoag due to h/o subdural hematoma.    [name] Fret  MD  [facility] 09/12/2024, 12:22 PM  Recent Labs  Lab 09/08/24 1724 09/10/24 0322 09/11/24 0416 09/11/24 0955 09/12/24 0349 09/12/24 0928  HGB 11.1*   < >  --    < > 10.2* 11.6*  ALBUMIN  2.3*   --   --   --  2.0*  --   CALCIUM  8.9   < > 8.7*  --  9.0  --   PHOS  --    < > 5.4*  --  6.0*  --   CREATININE 4.46*   < > 4.43*  --  4.04*  --   K 4.8   < > 4.6   < > 4.6 5.2*   < > = values in this interval not displayed.    Inpatient medications:  amiodarone   200 mg Oral BID   Chlorhexidine  Gluconate Cloth  6 each Topical Daily   feeding supplement (NEPRO CARB STEADY)  237 mL Oral TID BM   feeding supplement (PROSource TF20)  60 mL Per Tube TID   folic acid   1 mg Oral Daily   heparin   5,000 Units Subcutaneous Q8H   hydrocortisone  sod succinate (SOLU-CORTEF ) inj  100 mg Intravenous Q12H   midodrine   15 mg Oral TID WC   multivitamin  1 tablet Oral QHS   mupirocin  ointment  1 Application Nasal BID   mouth rinse  15 mL Mouth Rinse 4 times per day   sertraline   25 mg Oral Daily   sevelamer  carbonate  3,200 mg Oral TID WC   thiamine   100 mg Oral Daily    feeding supplement (VITAL AF 1.2 CAL) 20 mL/hr at 09/12/24 0800   heparin  10,000 units/ 20 mL infusion syringe     prismasol  BGK 4/2.5     prismasol  BGK 4/2.5     prismasol  BGK 4/2.5     acetaminophen , alteplase , docusate sodium , heparin , heparin , lidocaine  (PF), lidocaine -prilocaine , LORazepam , melatonin, midodrine , naLOXone  (NARCAN )  injection, mouth rinse, oxyCODONE  **OR** oxyCODONE , pentafluoroprop-tetrafluoroeth, polyethylene glycol

## 2024-09-12 NOTE — Procedures (Signed)
 Central Venous Catheter Insertion Procedure Note  [name]  [id number]  July 24, 1981  Date:09/12/24  Time:1:43 PM   Provider Performing:Chela Sutphen [name]   Procedure: Insertion of Non-tunneled Central Venous [phone]) with US  guidance (23062)   Indication(s) Hemodialysis  Consent Risks of the procedure as well as the alternatives and risks of each were explained to the patient and/or caregiver.  Consent for the procedure was obtained and is signed in the bedside chart  Anesthesia Topical only with 1% lidocaine    Timeout Verified patient identification, verified procedure, site/side was marked, verified correct patient position, special equipment/implants available, medications/allergies/relevant history reviewed, required imaging and test results available.  Sterile Technique Maximal sterile technique including full sterile barrier drape, hand hygiene, sterile gown, sterile gloves, mask, hair covering, sterile ultrasound probe cover (if used).  Procedure Description Area of catheter insertion was cleaned with chlorhexidine  and draped in sterile fashion.  With real-time ultrasound guidance a HD catheter was placed into the left internal jugular vein. Nonpulsatile blood flow and easy flushing noted in all ports.  The catheter was sutured in place and sterile dressing applied.  Complications/Tolerance None; patient tolerated the procedure well. Chest X-ray is ordered to verify placement for internal jugular or subclavian cannulation.   Chest x-ray is not ordered for femoral cannulation.  EBL Minimal  Specimen(s) None    Deward [name], AGACNP-BC [facility]  See Amion for personal pager PCCM on call pager [phone] until 7pm. Please call Elink 7p-7a. [phone]  09/12/2024 1:44 PM

## 2024-09-12 NOTE — Progress Notes (Signed)
 NAME:  [name], MRN:  [id number], DOB:  [birth year], LOS: 12 ADMISSION DATE:  08/31/2024, CONSULTATION DATE:  08/31/24 REFERRING MD: Dr. 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 Also had decreased appetite and fatigue. Denies fevers, chills. In ED, started on BiPAP. Able to transition to 6L [place]. CXR with pulmonary edema. K 5.3 BNP elevated. Normal WBC. LA 6.2>3.2. Given levaquin . SBP readings low-normal with intermittent readings with SBP in 60s and 70s but mentating well and eating a chicken wrap. PCCM and Nephrology consulted. Plan for hemodialysis in ICU.   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 HD 1.1L 9/5: HD removed 1 with levophed  9/6: HD removed 2L with levophed  9/7: Weaning pressors 9/8 moved out of ICU. Palliative care consulted. Also seen by vascular surg for bleeding of right AV fistula site. Suture placed. Pressure held. Advised against fistulogram, and advised HD cath if issues in future. On-going hypotension w/ HD noted.  9/9 -9/11 on-going supportive care on 9/12 poor po intake. No appetite, no endurance, not able to complete meal.  Started on dextrose  gtt. Called again for hypotension. Got 250 ML and placed supine BP improved.  9/12 transferred back to ICU for hypotension, hypoxia after fluid  bolus with very high BiPAP settings 9/13 iHD, able to come off bipap  Interim History / Subjective:  No complaints this morning.  Remains off pressors with adequate BP  Objective   Blood pressure 132/78, pulse 89, temperature (!) 97.1 F (36.2 C), temperature source Axillary, resp. rate 18, height 5' 2 (1.575 m), weight 39.2 kg, SpO2 96%.        Intake/Output Summary (Last 24 hours) at 09/12/2024 9177 Last data filed at 09/12/2024 0408 Gross per 24 hour  Intake 1339.45 ml  Output 1300 ml  Net 39.45 ml   Filed Weights   09/11/24 1400 09/11/24 1910 09/12/24 0500  Weight: 40 kg 39 kg 39.2 kg   Physical Exam: General:  Frail middle aged female in NAD Neuro:  Alert, oriented, non-focal HEENT:  [place]/AT, PERRL, no JVD Cardiovascular:  RRR, no MRG Lungs:  Bibasilar crackles Abdomen:  firm, non-tender, non-distended Musculoskeletal:  No acute deformity Skin:  Intact, MMM  WBC 19.5   Resolved Hospital Problem list   Acute on chronic resp failure AGMA Hyperkalemia  Hypocalcemia  Bleeding from AVF site suture placed   Assessment & Plan:   Multiorgan failure due to complications of advanced lupus: ESRD, vasoplegia, cardioplegia. Biventricular failure. MR, AI.  - Ongoing trial of iHD - Appreciate nephrology - Midodrine  - Hydrocortisone  - Could possibly assess for high output state by occluding fistula with R heart cath. Likely low yield, but for the sake of completeness maybe worthwhile.  - Not a candidate for CRRT d/t no destination.  - Heart failure consulted, nothing additional to offer.   Hypoxia: poor perfusion, hard to trust pleth. PaO2 on ABG 144,  77 - Tolerating 3L, will check ABG - Wean as tolerated  Severe cachexia, deconditioning - Tube feeds, 50mL/hr.  - High risk of refeeding. - Pulse dose thiamine .  - Appreciate dietician consult  Atrial fib - amiodarone  - no AC   Hypoglycemia: Have been chasing CBGs that remain chronically low: local milieu in  fingerbeds suspicious for chronic catabolic state so question accuracy. - Stop D10 1/2 NS can restart D10 if necessary. Now on steroids - Continue tube feeds.   Leukocytosis: - Afebrile, no complaints. Monitor.  - Likely due to steroids stated 9/15  History cocaine  dependence:   - supportive care  Critical care time 42 minutes  Deward Eastern, AGACNP-BC [facility]  See Amion for personal pager PCCM on call pager [phone] until 7pm. Please call Elink 7p-7a. [phone]  09/12/2024 8:55 AM

## 2024-09-13 ENCOUNTER — Encounter (HOSPITAL_COMMUNITY): Payer: Self-pay | Admitting: Cardiology

## 2024-09-13 DIAGNOSIS — R0902 Hypoxemia: Secondary | ICD-10-CM | POA: Diagnosis not present

## 2024-09-13 DIAGNOSIS — R4589 Other symptoms and signs involving emotional state: Secondary | ICD-10-CM

## 2024-09-13 DIAGNOSIS — J810 Acute pulmonary edema: Secondary | ICD-10-CM | POA: Diagnosis not present

## 2024-09-13 DIAGNOSIS — Z66 Do not resuscitate: Secondary | ICD-10-CM

## 2024-09-13 DIAGNOSIS — I5043 Acute on chronic combined systolic (congestive) and diastolic (congestive) heart failure: Secondary | ICD-10-CM | POA: Diagnosis not present

## 2024-09-13 DIAGNOSIS — Z7189 Other specified counseling: Secondary | ICD-10-CM | POA: Diagnosis not present

## 2024-09-13 DIAGNOSIS — N186 End stage renal disease: Secondary | ICD-10-CM | POA: Diagnosis not present

## 2024-09-13 DIAGNOSIS — E162 Hypoglycemia, unspecified: Secondary | ICD-10-CM | POA: Diagnosis not present

## 2024-09-13 DIAGNOSIS — R570 Cardiogenic shock: Secondary | ICD-10-CM | POA: Diagnosis not present

## 2024-09-13 DIAGNOSIS — Z515 Encounter for palliative care: Secondary | ICD-10-CM | POA: Diagnosis not present

## 2024-09-13 LAB — RENAL FUNCTION PANEL
Albumin: 2 g/dL — ABNORMAL LOW (ref 3.5–5.0)
Albumin: 2 g/dL — ABNORMAL LOW (ref 3.5–5.0)
Anion gap: 10 (ref 5–15)
Anion gap: 14 (ref 5–15)
BUN: 39 mg/dL — ABNORMAL HIGH (ref 6–20)
BUN: 30 mg/dL — ABNORMAL HIGH (ref 6–20)
CO2: 18 mmol/L — ABNORMAL LOW (ref 22–32)
CO2: 19 mmol/L — ABNORMAL LOW (ref 22–32)
Calcium: 8.9 mg/dL (ref 8.9–10.3)
Calcium: 9.3 mg/dL (ref 8.9–10.3)
Chloride: 101 mmol/L (ref 98–111)
Chloride: 101 mmol/L (ref 98–111)
Creatinine, Ser: 2.12 mg/dL — ABNORMAL HIGH (ref 0.44–1.00)
Creatinine, Ser: 1.48 mg/dL — ABNORMAL HIGH (ref 0.44–1.00)
GFR, Estimated: 30 mL/min — ABNORMAL LOW (ref >60)
GFR, Estimated: 46 mL/min — ABNORMAL LOW (ref >60)
Glucose, Bld: 139 mg/dL — ABNORMAL HIGH (ref 70–99)
Glucose, Bld: 140 mg/dL — ABNORMAL HIGH (ref 70–99)
Phosphorus: 3.8 mg/dL (ref 2.5–4.6)
Phosphorus: 3 mg/dL (ref 2.5–4.6)
Potassium: 4.4 mmol/L (ref 3.5–5.1)
Potassium: 4.5 mmol/L (ref 3.5–5.1)
Sodium: 129 mmol/L — ABNORMAL LOW (ref 135–145)
Sodium: 134 mmol/L — ABNORMAL LOW (ref 135–145)

## 2024-09-13 LAB — CBC
HCT: 31.6 % — ABNORMAL LOW (ref 36.0–46.0)
Hemoglobin: 10.3 g/dL — ABNORMAL LOW (ref 12.0–15.0)
MCH: 28.1 pg (ref 26.0–34.0)
MCHC: 32.6 g/dL (ref 30.0–36.0)
MCV: 86.1 fL (ref 80.0–100.0)
Platelets: 209 K/uL (ref 150–400)
RBC: 3.67 MIL/uL — ABNORMAL LOW (ref 3.87–5.11)
RDW: 20.3 % — ABNORMAL HIGH (ref 11.5–15.5)
WBC: 15 K/uL — ABNORMAL HIGH (ref 4.0–10.5)
nRBC: 1.5 % — ABNORMAL HIGH (ref 0.0–0.2)

## 2024-09-13 LAB — COOXEMETRY PANEL
Carboxyhemoglobin: 1.9 % — ABNORMAL HIGH (ref 0.5–1.5)
Methemoglobin: <0.7 % (ref 0.0–1.5)
O2 Saturation: 49 %
Total hemoglobin: 10.9 g/dL — ABNORMAL LOW (ref 12.0–16.0)

## 2024-09-13 LAB — GLUCOSE, CAPILLARY
Glucose-Capillary: 133 mg/dL — ABNORMAL HIGH (ref 70–99)
Glucose-Capillary: 125 mg/dL — ABNORMAL HIGH (ref 70–99)
Glucose-Capillary: 149 mg/dL — ABNORMAL HIGH (ref 70–99)
Glucose-Capillary: 125 mg/dL — ABNORMAL HIGH (ref 70–99)
Glucose-Capillary: 139 mg/dL — ABNORMAL HIGH (ref 70–99)
Glucose-Capillary: 130 mg/dL — ABNORMAL HIGH (ref 70–99)
Glucose-Capillary: 110 mg/dL — ABNORMAL HIGH (ref 70–99)

## 2024-09-13 LAB — MAGNESIUM: Magnesium: 2.5 mg/dL — ABNORMAL HIGH (ref 1.7–2.4)

## 2024-09-13 MED ORDER — VITAL AF 1.2 CAL PO LIQD
1000.0000 mL | ORAL | Status: DC
Start: 2024-09-13 — End: 2024-09-20
  Administered 2024-09-16 – 2024-09-20 (×4): 1000 mL
  Filled 2024-09-13: qty 1000

## 2024-09-13 MED ORDER — MODAFINIL 100 MG PO TABS
100.0000 mg | ORAL_TABLET | Freq: Every day | ORAL | Status: DC
  Administered 2024-09-13 – 2024-09-20 (×8): 100 mg
  Filled 2024-09-13 (×8): qty 1

## 2024-09-13 MED ORDER — VITAL AF 1.2 CAL PO LIQD
1000.0000 mL | ORAL | Status: DC
  Administered 2024-09-13: 1000 mL

## 2024-09-13 NOTE — Progress Notes (Signed)
 Daily Progress Note   Date: 09/13/2024   Patient Name: [name]  DOB: 06/16/1981  MRN: [id number]  Age / Sex: 43 y.o., female  Attending Physician: Claudene Toribio BROCKS, MD Primary Care Physician: Lorren Greig PARAS, NP Admit Date: 08/31/2024 Length of Stay: 13 days  Reason for Follow-up: {Reason for Consult:23484}  Past Medical History:  Diagnosis Date   Anemia    low iron  - receives iron  at dialysis   Anxiety    Arthritis    RA   Atrial fibrillation with RVR (HCC) 12/14/2023   Chronic systolic congestive heart failure (HCC) 03/16/2016   Dyspnea    ESRD (end stage renal disease) (HCC)    [facility] _ [place]   H/O pericarditis 01/17/2013   H/O pleural effusion 01/17/2013   Heart murmur    Lupus (systemic lupus erythematosus) (HCC)    Previously followed with Dr. 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 pneumonae bacteremia on 01/21/2013. Sensitive to Peniccilin     Subjective:   Subjective: Chart Reviewed. Updates received. Patient Assessed. Created space and opportunity for patient  and family to explore thoughts and feelings regarding current medical situation.  Today's Discussion: Today before meeting with the patient/family, I reviewed the chart notes including ***. I also reviewed vital signs, nursing flowsheets, medication administrations record, labs, and imaging. Labs reviewed include ***.  ***  Review of Systems  Objective:   Primary Diagnoses: Present on Admission:  Acute  respiratory failure with hypoxia (HCC)  Acute on chronic combined systolic and diastolic CHF (congestive heart failure) (HCC)  Atrial flutter (HCC)  Fluid overload  Hemodialysis catheter malfunction (HCC)  Insomnia  Lupus (systemic lupus erythematosus) (HCC)   Vital Signs:  BP (!) 128/53   Pulse 95   Temp (!) 97 F (36.1 C) (Axillary)   Resp (!) 23   Ht 5' 2 (1.575 m)   Wt 37.9 kg   LMP  (LMP Unknown) Comment: states over 2 years ago  SpO2 100%   BMI 15.28 kg/m   Physical Exam  Palliative Assessment/Data: ***   Existing Vynca/ACP Documentation: ***  Advanced Care Planning:   Existing Vynca/ACP Documentation: ***  Primary Decision Maker: {Primary Decision Fjxzm:78612}  Pertinent diagnosis: ***  The patient and/or family consented to a voluntary Advance Care Planning Conversation in person/over the phone***. Individuals present for the conversation: ***  Summary of the conversation: ***  Outcome of the conversations and/or documents completed: ***  I spent *** minutes providing separately identifiable ACP services with the patient and/or surrogate decision maker in a voluntary, in-person conversation discussing the patient's wishes and goals as detailed in the above note.  Assessment & Plan:   HPI/Patient Profile:  ***  SUMMARY OF RECOMMENDATIONS   ***  Symptom Management:  ***  Code Status: {Updated Palliative Code Status:33307}  Prognosis: {Palliative Care Prognosis:23504}  Discharge Planning: {Palliative dispostion:23505}  Discussed with: ***  Thank you for allowing us  to participate in the care of Rosina [name]  PMT will continue to support holistically.  Time Total: ***  Detailed review of medical records (labs, imaging, vital signs), medically appropriate exam, discussed with treatment team, counseling and education to patient, family, & staff, documenting clinical information, medication management, coordination of care  [name],  NP Palliative Medicine Team  Team Phone # [phone] (Nights/Weekends)  08/26/2021, 8:17 AM

## 2024-09-13 NOTE — Progress Notes (Signed)
 Nutrition Follow-up  DOCUMENTATION CODES:   Underweight, Severe malnutrition in context of chronic illness  INTERVENTION:   Tube Feeding via Cortrak: Vital AF 1.2 at 50 ml/hr TF increased to 30 ml/hr this AM, increase by 10 mL q 12 hours until goal rate of 50 ml/hr TF at goal provides 1440 kcals 90 g of protein and 972 mL of free water    Continue liberalized diet with po intake as tolerated  Continue to monitor electrolytes closely with CRRT plus concern for refeeding  D/C Nepro for now  Continue Renal MVI Continue Folic Acid  Continue Thiamine ; noted MD ordered high dose IV thiamine    NUTRITION DIAGNOSIS:   Severe Malnutrition related to chronic illness (ESRD, HFrEF) as evidenced by severe fat depletion, severe muscle depletion.  Continues  GOAL:   Patient will meet greater than or equal to 90% of their needs  Not met but being addressed  MONITOR:   PO intake, Supplement acceptance, Labs, Weight trends, I & O's  REASON FOR ASSESSMENT:   Consult Enteral/tube feeding initiation and management  ASSESSMENT:   43 y/o female with h/o CHF, Afib (not on anticoagulation), pericardial effusion with tamponade, SLE with nephritis, ESRD on HD, substance abuse, IDA, HTN, GERD, seizures, NICM and SDH who is admitted with volume overload, end-stage heart failure and AV fistula bleed.  9/04 Admitted, ICU 9/05 iHD with 1L removed with levophed  9/06 iHD with 2L removed with levophed  9/08 Transferred out of ICU, iHD, ongoing hypotension 9/12 Transferred back to ICU with hypoxia and hypotension, BiPap. Hypoglycemia reqiuring dextrose  gtt 9/13 iHD, off Bipap 9/16 RHC, CRRT initiated-time limited trial with attempt to achieve euvolemia  Pt now DNR  CRRT initiated yesterday. Tolerating UF at 75 ml/hr, attempting 100-150 mL/hr but no tolerated so far CVP 15-17 today. 2.5 L net UF via CRRT in 24 hours   Noted phosphorus trending down post CRRT, watch closely as may need to start  supplementation  Weight down to 37.9 kg from 39.2 kg yesterday. Noted edema still present on exam  Noted D10-1/2 NS started back yesterday at 30 ml/hr  Tolerating Vital AF 1.2 at 20 ml/hr, increased to 30 ml/hr this AM per MD. Recommended goal rate to meet estimated nutritional needs is 50 ml/hr currently  Not eating much, not drinking Nepro shakes. Noted mentation is waxing and waning  +BM x 1, no diarrhea  Labs: Sodium 129 (L) Potassium 4.4 (wdl) Phosphorus 3.8 (wdl) Magnesium 2.5 (H)  Meds: Midodrine  Rena-Vite Solucortef Folic Acid  Thiamine    Diet Order:   Diet Order             Diet regular Room service appropriate? Yes with Assist; Fluid consistency: Thin; Fluid restriction: 1200 mL Fluid  Diet effective now                   EDUCATION NEEDS:   Not appropriate for education at this time  Skin:  Skin Assessment: Reviewed RN Assessment  Last BM:  9/14  Height:   Ht Readings from Last 1 Encounters:  08/31/24 5' 2 (1.575 m)    Weight:   Wt Readings from Last 1 Encounters:  09/13/24 37.9 kg    Ideal Body Weight:  50 kg  BMI:  Body mass index is 15.28 kg/m.  Estimated Nutritional Needs:   Kcal:  1500-1700kcal/day  Protein:  70-80g/day  Fluid:  UOP +1L   Betsey Finger MS, RDN, LDN, CNSC Registered Dietitian 3 Clinical Nutrition RD Inpatient Contact Info in Amion

## 2024-09-13 NOTE — Plan of Care (Signed)
  Problem: Clinical Measurements: Goal: Ability to maintain clinical measurements within normal limits will improve Outcome: Progressing Goal: Will remain free from infection Outcome: Progressing Goal: Diagnostic test results will improve Outcome: Progressing Goal: Respiratory complications will improve Outcome: Progressing Goal: Cardiovascular complication will be avoided Outcome: Progressing   Problem: Activity: Goal: Risk for activity intolerance will decrease Outcome: Progressing   Problem: Nutrition: Goal: Adequate nutrition will be maintained Outcome: Progressing   Problem: Coping: Goal: Level of anxiety will decrease Outcome: Progressing   Problem: Elimination: Goal: Will not experience complications related to bowel motility Outcome: Progressing Goal: Will not experience complications related to urinary retention Outcome: Progressing   Problem: Pain Managment: Goal: General experience of comfort will improve and/or be controlled Outcome: Progressing   Problem: Safety: Goal: Ability to remain free from injury will improve Outcome: Progressing   Problem: Skin Integrity: Goal: Risk for impaired skin integrity will decrease Outcome: Progressing   Problem: Education: Goal: Individualized Educational Video(s) Outcome: Progressing   Problem: Fluid Volume: Goal: Compliance with measures to maintain balanced fluid volume will improve Outcome: Progressing   Problem: Health Behavior/Discharge Planning: Goal: Ability to manage health-related needs will improve Outcome: Progressing   Problem: Nutritional: Goal: Ability to make healthy dietary choices will improve Outcome: Progressing   Problem: Clinical Measurements: Goal: Complications related to the disease process, condition or treatment will be avoided or minimized Outcome: Progressing   Problem: Education: Goal: Ability to describe self-care measures that may prevent or decrease complications (Diabetes  Survival Skills Education) will improve Outcome: Progressing Goal: Individualized Educational Video(s) Outcome: Progressing   Problem: Coping: Goal: Ability to adjust to condition or change in health will improve Outcome: Progressing   Problem: Fluid Volume: Goal: Ability to maintain a balanced intake and output will improve Outcome: Progressing   Problem: Health Behavior/Discharge Planning: Goal: Ability to identify and utilize available resources and services will improve Outcome: Progressing Goal: Ability to manage health-related needs will improve Outcome: Progressing   Problem: Metabolic: Goal: Ability to maintain appropriate glucose levels will improve Outcome: Progressing   Problem: Nutritional: Goal: Maintenance of adequate nutrition will improve Outcome: Progressing Goal: Progress toward achieving an optimal weight will improve Outcome: Progressing   Problem: Skin Integrity: Goal: Risk for impaired skin integrity will decrease Outcome: Progressing   Problem: Tissue Perfusion: Goal: Adequacy of tissue perfusion will improve Outcome: Progressing   Problem: Education: Goal: Ability to demonstrate management of disease process will improve Outcome: Progressing Goal: Ability to verbalize understanding of medication therapies will improve Outcome: Progressing Goal: Individualized Educational Video(s) Outcome: Progressing   Problem: Activity: Goal: Capacity to carry out activities will improve Outcome: Progressing   Problem: Cardiac: Goal: Ability to achieve and maintain adequate cardiopulmonary perfusion will improve Outcome: Progressing   Problem: Education: Goal: Understanding of CV disease, CV risk reduction, and recovery process will improve Outcome: Progressing Goal: Individualized Educational Video(s) Outcome: Progressing   Problem: Activity: Goal: Ability to return to baseline activity level will improve Outcome: Progressing   Problem:  Cardiovascular: Goal: Ability to achieve and maintain adequate cardiovascular perfusion will improve Outcome: Progressing Goal: Vascular access site(s) Level 0-1 will be maintained Outcome: Progressing   Problem: Health Behavior/Discharge Planning: Goal: Ability to safely manage health-related needs after discharge will improve Outcome: Progressing

## 2024-09-13 NOTE — Progress Notes (Signed)
 Advanced Heart Failure Rounding Note  Cardiologist: Shelda Bruckner, MD  Chief Complaint: Multi system organ failure 2/2 advanced lupus  Subjective:    RHC yesterday with severe biventricular failure and severely elevated filling pressures complicated by severe TR.   Fem a line placed. Very wide pulse pressure. Would not have tolerated iHD. Now on CRRT  Resting in bed. Lethargic. Minimally interactive.   Objective:   Weight Range: 37.9 kg Body mass index is 15.28 kg/m.   Vital Signs:   Temp:  [96.8 F (36 C)-97.5 F (36.4 C)] 97 F (36.1 C) (09/17 0400) Pulse Rate:  [80-133] 97 (09/17 0700) Resp:  [13-38] 20 (09/17 0700) BP: (128-147)/(48-83) 128/53 (09/16 1156) SpO2:  [55 %-100 %] 100 % (09/17 0700) Arterial Line BP: (83-113)/(16-40) 105/28 (09/17 0700) Weight:  [37.9 kg] 37.9 kg (09/17 0500) Last BM Date : 09/11/24  Weight change: Filed Weights   09/11/24 1910 09/12/24 0500 09/13/24 0500  Weight: 39 kg 39.2 kg 37.9 kg    Intake/Output:   Intake/Output Summary (Last 24 hours) at 09/13/2024 1004 Last data filed at 09/13/2024 0900 Gross per 24 hour  Intake 1297.28 ml  Output 2839.3 ml  Net -1542.02 ml    Physical Exam  General:  chronically ill appearing. Frail, cachetic.  Neck: JVD elevated with TR.  Cor: Regular rate & irregular rhythm.  Lungs: clear, diminished bases Extremities: no edema  Neuro: Not interactive. Affect flat.  Telemetry   A fib/flutter 90s, intt RVR when removal rate increased on CRRT (Personally reviewed)    Labs    CBC Recent Labs    09/12/24 0349 09/12/24 0928 09/12/24 1146 09/13/24 0349  WBC 19.5*  --   --  15.0*  HGB 10.2*   < > 12.6 10.3*  HCT 31.4*   < > 37.0 31.6*  MCV 86.7  --   --  86.1  PLT 185  --   --  209   < > = values in this interval not displayed.   Basic Metabolic Panel Recent Labs    90/83/74 0349 09/12/24 0928 09/12/24 1649 09/13/24 0349  NA 120*   < > 125* 129*  K 4.6   < > 4.7 4.4  CL  87*  --  92* 101  CO2 15*  --  16* 18*  GLUCOSE 143*  --  101* 139*  BUN 45*  --  50* 39*  CREATININE 4.04*  --  3.77* 2.12*  CALCIUM  9.0  --  8.9 8.9  MG 2.4  --   --  2.5*  PHOS 6.0*  --  5.2* 3.8   < > = values in this interval not displayed.   Liver Function Tests Recent Labs    09/12/24 1649 09/13/24 0349  ALBUMIN  1.9* 2.0*   No results for input(s): LIPASE, AMYLASE in the last 72 hours. Cardiac Enzymes No results for input(s): CKTOTAL, CKMB, CKMBINDEX, TROPONINI in the last 72 hours.  BNP: BNP (last 3 results) Recent Labs    05/18/24 2210 08/31/24 0725 09/01/24 0413  BNP >4,500.0* >4,500.0* >4,500.0*    ProBNP (last 3 results) No results for input(s): PROBNP in the last 8760 hours.   D-Dimer No results for input(s): DDIMER in the last 72 hours. Hemoglobin A1C No results for input(s): HGBA1C in the last 72 hours. Fasting Lipid Panel No results for input(s): CHOL, HDL, LDLCALC, TRIG, CHOLHDL, LDLDIRECT in the last 72 hours. Thyroid Function Tests No results for input(s): TSH, T4TOTAL, T3FREE, THYROIDAB in the last  72 hours.  Invalid input(s): FREET3  Other results:   Imaging    DG CHEST PORT 1 VIEW Result Date: 09/12/2024 EXAM: 1 VIEW XRAY OF THE CHEST 09/12/2024 02:33:00 PM COMPARISON: 09/08/2024 CLINICAL HISTORY: Encounter for central line placement. FINDINGS: LINES, TUBES AND DEVICES: Enteric tube coursing below the hemidiaphragm with tip collimated off view. Left internal jugular central venous catheter with tip overlying the expected region of the superior vena cava. LUNGS AND PLEURA: Diffuse interstitial and airspace opacities similar to prior. Possible trace bilateral pleural effusions. No pneumothorax. HEART AND MEDIASTINUM: Stable cardiomegaly. Aortic calcification. BONES AND SOFT TISSUES: Old healed right rib fractures. IMPRESSION: 1. Diffuse interstitial and airspace opacities, similar to prior. 2. Left  internal jugular central venous catheter and enteric tube in appropriate positions, with the tip of the enteric tube collimated off view. Electronically signed by: Norman Gatlin MD 09/12/2024 02:53 PM EDT RP Workstation: [id number]   CARDIAC CATHETERIZATION Result Date: 09/12/2024 HEMODYNAMICS: RA:   20 mmHg with large V waves / ventricularization of wave form RV:   39/15-20 mmHg PA:   40/30 mmHg (33 mean) PCWP:  29 mmHg (mean)    Estimated Fick CO/CI   2 L/min, 1.5 L/min/m2 Thermodilution CO/CI   2.7 L/min, 2 L/min/m2 AO pressure via right femoral artery: 83/30    TPG    4  mmHg     PVR     ~2 Wood Units PAPi      0.5  IMPRESSION: Severely elevated pre and post capillary filling pressures RA:CVP ratio, PAPi and RA waveform consistent with severe RV failure with severe tricuspid regurgitation. Severely reduced cardiac output / index. RFA arterial line placed without complications. Aditya Sabharwal 11:56 AM     Medications:     Scheduled Medications:  amiodarone   200 mg Oral BID   Chlorhexidine  Gluconate Cloth  6 each Topical Daily   feeding supplement (NEPRO CARB STEADY)  237 mL Oral TID BM   feeding supplement (PROSource TF20)  60 mL Per Tube TID   folic acid   1 mg Oral Daily   heparin   5,000 Units Subcutaneous Q8H   hydrocortisone  sod succinate (SOLU-CORTEF ) inj  100 mg Intravenous Q12H   midodrine   15 mg Oral TID WC   modafinil   100 mg Per Tube Daily   multivitamin  1 tablet Oral QHS   mupirocin  ointment  1 Application Nasal BID   mouth rinse  15 mL Mouth Rinse 4 times per day   sertraline   25 mg Oral Daily   [START ON 09/17/2024] thiamine  (VITAMIN B1) injection  100 mg Intravenous Q24H    Infusions:  dextrose  10 % and 0.45 % NaCl Stopped (09/13/24 0741)   feeding supplement (VITAL AF 1.2 CAL) 30 mL/hr at 09/13/24 0900   norepinephrine  (LEVOPHED ) Adult infusion     prismasol  BGK 4/2.5 1,200 mL/hr at 09/13/24 9266   prismasol  BGK 4/2.5 400 mL/hr at 09/13/24 0314   prismasol  BGK  4/2.5 400 mL/hr at 09/13/24 9683   thiamine  (VITAMIN B1) injection 500 mg (09/13/24 0910)   Followed by   NOREEN ON 09/14/2024] thiamine  (VITAMIN B1) injection      PRN Medications: acetaminophen , alteplase , docusate sodium , heparin , heparin , lidocaine  (PF), lidocaine -prilocaine , LORazepam , melatonin, midodrine , naLOXone  (NARCAN )  injection, mouth rinse, oxyCODONE  **OR** oxyCODONE , pentafluoroprop-tetrafluoroeth, polyethylene glycol    Patient Profile  [name] is a 43 y.o. 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 Admitted with multiorgan failure due to advanced lupus.   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  - Compression socks too large for her legs, peds called and they do not have smaller ones.  Suspect abd binder would be too large as well.  - Overall poor prognosis with ongoing hypotension, frailty and comorbidities. Now back on CRRT as she has a very wide pulse pressure and unable to tolerate iHD. (Diastolics in the 20s)   HFrEF with RV failure - Echo 9/25: EF 30-35%, LV with GHK, RV severely reduced, LA/RA severely dilated, mod MR, severe TR - RHC as above.  - NYHA IV on admission - Volume management per iHD. CVP ~16 with V waves to 30s 2/2 severe TR.  - No GDMT with ESRD and hypotension - She is not a candidate for advanced therapies with numerous comorbidities, hx cocaine  use and severe deconditioning/frailty now in MSOF   ESRD Lupus nephritis - Has been having difficulty tolerating iHD. Has required levo in the past - Back on CRRT as of 9/16. Plan for 24-48 hr trial.  - Continue midodrine  15 mg TID   Valvular heart  disease - Echo 9/25 with mod MR, severe TR and mod-severe aortic valve regurgitation - severe TR on RHC 9/16 - Not a candidate for intervention with frailty   A fib/flutter - A fib 90s on tele - Continue amiodarone  200 mg BID - off AC with hx of SDH and recurrent falls   Hx cocaine  dependence - last + 5/25   Respiratory failure - required BiPAP 9/12 - Stable on Wittmann now - PCCM following   Cachexia/ Frailty Protein malnutrition>>Hypoglycemia Deconditioning - per primary team - Palliative care last saw 9/11, at that time patient wished for full code and full scope of care. Discussions ongoing.  - Now with cortrak  GOC: Now DNR/DNI  CRITICAL CARE Performed by: Beckey LITTIE Coe  Total critical care time: 14 minutes  Critical care time was exclusive of separately billable procedures and treating other patients.  Critical care was necessary to treat or prevent imminent or life-threatening deterioration.  Critical care was time spent personally by me on the following activities: development of treatment plan with patient and/or surrogate as well as nursing, discussions with consultants, evaluation of patient's response to treatment, examination of patient, obtaining history from patient or surrogate, ordering and performing treatments and interventions, ordering and review of laboratory studies, ordering and review of radiographic studies, pulse oximetry and re-evaluation of patient's condition.   Length of Stay: 13  Beckey LITTIE Coe, NP  09/13/2024, 10:04 AM  Advanced Heart Failure Team Pager [phone] (M-F; 7a - 5p)  Please contact [facility] for night-coverage after hours (5p -7a ) and weekends on [email or web address]

## 2024-09-13 NOTE — TOC Progression Note (Signed)
 Transition of Care [facility]) - Progression Note    Patient Details  Name: [name] MRN: [id number] Date of Birth: 1981-09-08  [facility] of [facility] CM/SW Contact  Isaiah Public, LCSWA Phone Number: 09/13/2024, 2:29 PM  Clinical Narrative:     CSW continues to follow. Patient currently has cortrak. CSW plans to follow up on SNF bed offers/patients dc plan, closer to patient being medically ready for dc.  Expected Discharge Plan: Home/Self Care Barriers to Discharge: Continued Medical Work up               Expected Discharge Plan and Services       Living arrangements for the past 2 months: Single Family Home                                       Social Drivers of Health (SDOH) Interventions SDOH Screenings   Food Insecurity: Patient Declined (09/04/2024)  Housing: Low Risk  (05/20/2024)  Transportation Needs: No Transportation Needs (05/20/2024)  Utilities: Not At Risk (05/20/2024)  Alcohol  Screen: Low Risk  (05/31/2024)  Depression (PHQ2-9): High Risk (05/31/2024)  Financial Resource Strain: Low Risk  (05/31/2024)  Physical Activity: Inactive (05/31/2024)  Social Connections: Socially Isolated (05/31/2024)  Stress: Stress Concern Present (05/31/2024)  Tobacco Use: High Risk (09/05/2024)  Health Literacy: Adequate Health Literacy (05/31/2024)    Readmission Risk Interventions    05/22/2024   11:49 AM 04/17/2022    8:36 AM  Readmission Risk Prevention Plan  Transportation Screening Complete Complete  PCP or Specialist Appt within 3-5 Days  Complete  HRI or Home Care Consult  Complete  Social Work Consult for Recovery Care Planning/Counseling  Complete  Palliative Care Screening  Not Applicable  Medication Review Oceanographer) Complete Complete  PCP or Specialist appointment within 3-5 days of discharge Complete   HRI or Home Care Consult Complete   Palliative Care Screening Not Applicable   Skilled Nursing Facility Not Applicable

## 2024-09-13 NOTE — Progress Notes (Signed)
 NAME:  [name], MRN:  [id number], DOB:  [birth year], LOS: 13 ADMISSION DATE:  08/31/2024, CONSULTATION DATE:  08/31/24 REFERRING MD: Dr. 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 Also had decreased appetite and fatigue. Denies fevers, chills. In ED, started on BiPAP. Able to transition to 6L [place]. CXR with pulmonary edema. K 5.3 BNP elevated. Normal WBC. LA 6.2>3.2. Given levaquin . SBP readings low-normal with intermittent readings with SBP in 60s and 70s but mentating well and eating a chicken wrap. PCCM and Nephrology consulted. Plan for hemodialysis in ICU.   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 HD 1.1L 9/5: HD removed 1 with levophed  9/6: HD removed 2L with levophed  9/7: Weaning pressors 9/8 moved out of ICU. Palliative care consulted. Also seen by vascular surg for bleeding of right AV fistula site. Suture placed. Pressure held. Advised against fistulogram, and advised HD cath if issues in future. On-going hypotension w/ HD noted.  9/9 -9/11 on-going supportive care on 9/12 poor po intake. No appetite, no endurance, not able to complete meal.  Started on dextrose  gtt. Called again for hypotension. Got 250 ML and placed supine BP improved.  9/12 transferred back to ICU for hypotension, hypoxia after fluid  bolus with very high BiPAP settings 9/13 iHD, able to come off bipap 9/16 RHC, see IPAL note, time-limited CRRT trial  Interim History / Subjective:  Waxing/waning mentation; tolerating fluid pull but occasional runs of afib/RVR Denies pain, states just feels tired  Objective   Blood pressure (!) 128/53, pulse 97, temperature (!) 97 F (36.1 C), temperature source Axillary, resp. rate 20, height 5' 2 (1.575 m), weight 37.9 kg, SpO2 100%. CVP:  [3 mmHg-54 mmHg] 7 mmHg      Intake/Output Summary (Last 24 hours) at 09/13/2024 0716 Last data filed at 09/13/2024 0700 Gross per 24 hour  Intake 1222.09 ml  Output 2580.7 ml  Net -1358.61 ml   Filed Weights   09/11/24 1910 09/12/24 0500 09/13/24 0500  Weight: 39 kg 39.2 kg 37.9 kg   Physical Exam: Frail, weak +muscle wasting Shallow inspirations Ext lukewarm RASS -1  [place] 49   dextrose  10 % and 0.45 % NaCl 30 mL/hr at 09/13/24 0700   feeding supplement (VITAL AF 1.2 CAL) 20 mL/hr at 09/13/24 0700   norepinephrine  (LEVOPHED ) Adult infusion     prismasol  BGK 4/2.5 1,200 mL/hr at 09/13/24 0315   prismasol  BGK 4/2.5 400 mL/hr at 09/13/24 0314   prismasol  BGK 4/2.5 400 mL/hr at 09/13/24 0316   thiamine  (VITAMIN B1) injection Stopped (09/12/24 2208)   Followed by   NOREEN ON 09/14/2024] thiamine  (VITAMIN B1) injection       Resolved Hospital Problem list   Acute on chronic resp failure AGMA Hyperkalemia  Hypocalcemia  Bleeding from AVF site suture placed   Assessment & Plan:   Multiorgan failure due to complications of advanced lupus:  ESRD, vasoplegia, cardioplegia. Biventricular failure. MR, AI.  Hypoxemia- related to pulmonary edema and poor inspiration Cardiac and renal cachexia, at risk refeeding- cortrak in place Atrial fib- not on AC due to hx of subdural Hypoglycemia- improved but not resolved Steroid-induced Leukocytosis: empiric stress steroids 9/15 for hypoglycemia and shock state; hopefully wean over next couple  days History cocaine  dependence    - See IPAL note 9/16 - Plan to push fluid removal over next couple days with pressor limit of 20 levo + inotrope + vaso PRN (goal SBP 90); if gets to point where cannot tolerate we have exhausted options - If gets to euvolemia plan is then to wean off pressors; if cannot wean off pressors again we are at point of having exhausted options - Encourage day/night cycles as able, trial of modafinil  - Increase TF to 30, try to wean off IV dextrose ; follow usual refeeding labs - Continue midodrine  I guess as OP med - Once euglycemic off dextrose  will consider stress steroid wean - Longer term need to probably need to consider AC (subdural is old) but needs to show some forward progress - Will discuss inotropes with [facility] team  Very guarded prognosis, family updated at length 9/16 Consultants: nephro, HF  45 min cc time  09/13/2024 7:16 AM

## 2024-09-13 NOTE — Progress Notes (Signed)
 Physical Therapy Treatment Patient Details Name: [name] MRN: [id number] DOB: Jun 03, 1981 Today's Date: 09/13/2024   History of Present Illness 43 yo female admitted 08/31/24 for acute RF with hypoxia. 9/8 -9/12 progressive unit and on 9/12 ICU new bipap requirements and Hypotensive. PMH ESRD on HD, heart murmur, lupus, polysubstance abuse, seizures, chronic R SDH,cocaine  dependence, protein malnutrition hypotensive,    PT Comments  Pt is currently limited in mobility due to fatigue, CRRT and femoral line placement. Pt currently was able to roll R 2x, L 1x and back to supine each time with Min A. Pt was able to move LLE against gravity and perform limited glute bridge in bed. Pt able to perform A/ROM of the R knee, ankle and bil shoulders 2-3x each prior to fatigue. Due to pt current functional status, home set up and available assistance at home recommending skilled physical therapy services < 3 hours/day in order to address strength, balance and functional mobility to decrease risk for falls, injury, immobility, skin break down and re-hospitalization.      If plan is discharge home, recommend the following: Assistance with cooking/housework;Help with stairs or ramp for entrance;Assist for transportation;A lot of help with walking and/or transfers;A lot of help with bathing/dressing/bathroom   Can travel by private vehicle     No  Equipment Recommendations  Wheelchair (measurements PT);Wheelchair cushion (measurements PT)       Precautions / Restrictions Precautions Precautions: Fall Recall of Precautions/Restrictions: Intact Precaution/Restrictions Comments: watch sats and BP, R Femoral line, CRRT Restrictions Weight Bearing Restrictions Per Provider Order: No Other Position/Activity Restrictions: R hip flexion limited due to femoral line     Mobility  Bed Mobility Overal bed mobility: Needs Assistance Bed Mobility: Rolling Rolling: Min assist         General bed mobility  comments: Min assist to roll R/L and supine. Pt able to push up into bridge position, mobility limited due to CRRT and femoral line in R hip    Transfers   General transfer comment: unable to get to sitting today due to fatigue and hip ROM precautions           Communication Communication Communication: Impaired Factors Affecting Communication: Reduced clarity of speech  Cognition Arousal: Lethargic Behavior During Therapy: Flat affect   PT - Cognitive impairments: No apparent impairments     Following commands: Intact      Cueing Cueing Techniques: Verbal cues     General Comments General comments (skin integrity, edema, etc.): VSS on 3L O2 via [place] at during activity. Limited today Pt able to perform knee flexion/extension, hip ER (clamshell on the L), avoided movement of the R hip due to femoral line. Pt able to perform bil UE shoulder flexion 2x against gravity      Pertinent Vitals/Pain Pain Assessment Pain Assessment: Faces Faces Pain Scale: Hurts whole lot Pain Location: nose, LUE Pain Descriptors / Indicators: Aching, Grimacing, Discomfort Pain Intervention(s): Limited activity within patient's tolerance, Monitored during session     PT Goals (current goals can now be found in the care plan section) Acute Rehab PT Goals Patient Stated Goal: get stronger PT Goal Formulation: With patient Time For Goal Achievement: 09/17/24 Potential to Achieve Goals: Fair Progress towards PT goals: Progressing toward goals    Frequency    Min 1X/week      PT Plan  Continue with current POC        AM-PAC PT 6 Clicks Mobility   Outcome Measure  Help needed turning  from your back to your side while in a flat bed without using bedrails?: A Little Help needed moving from lying on your back to sitting on the side of a flat bed without using bedrails?: A Little Help needed moving to and from a bed to a chair (including a wheelchair)?: A Lot Help needed standing up  from a chair using your arms (e.g., wheelchair or bedside chair)?: A Lot Help needed to walk in hospital room?: Total Help needed climbing 3-5 steps with a railing? : Total 6 Click Score: 12    End of Session   Activity Tolerance: Patient limited by fatigue Patient left: in bed;with call bell/phone within reach;with bed alarm set;with nursing/sitter in room Nurse Communication: Mobility status PT Visit Diagnosis: Muscle weakness (generalized) (M62.81);Other abnormalities of gait and mobility (R26.89);Difficulty in walking, not elsewhere classified (R26.2)     Time: 1205-1221 PT Time Calculation (min) (ACUTE ONLY): 16 min  Charges:    $Therapeutic Activity: 8-22 mins PT General Charges $$ ACUTE PT VISIT: 1 Visit                    Dorothyann Maier, DPT, CLT  Acute Rehabilitation Services Office: [phone] (Secure chat preferred)    Dorothyann VEAR Maier 09/13/2024, 3:10 PM

## 2024-09-13 NOTE — Progress Notes (Addendum)
 [facility] Progress Note   Subjective:    Overnight tolerated around 75 cc/hr When UF kept at 100 cc/hr or higher has had problems w/ afib +RVR once last night and again this am   Objective Vitals:   09/13/24 0800 09/13/24 0900 09/13/24 1000 09/13/24 1100  BP:      Pulse: 97 (!) 102 87 90  Resp: (!) 30 (!) 34 18 (!) 25  Temp:      TempSrc:      SpO2: 100% (!) 87% 100% 98%  Weight:      Height:       Physical Exam General: Awake, alert, NAD, cachectic, ill-appearing, on HFNC Heart: RRR; No MRGs Lungs: normal WOB on HFNC Abdomen: Soft and non-tender Extremities: No LE edema Dialysis Access: AVF +t/b - weak but present   Dialysis Orders: 3h  B400   37.5kg   2K bath  AVF  Heparin  none Post 9/02 wt 38.2kg, usual comes off 0-1.5kg over Hb 12, no esa   Home bp meds: Midodrine  10 tid  Assessment/Plan: SOB/ pulm edema: thin margin between hypo and hypervolemia in setting of advanced cardiac disease. PCWP 29 mmHg on RHC 9/16, w/ severe RHF and severe TV regurgitation. iHD on hold now during CRRT trial.  ESRD: on HD TTS. Unable to tolerate iHD well and unable to get sufficient volume off w/ iHD. Started trial of CRRT w/ pressor and inotrope support yesterday 9/16. Cont CRRT per CCM.  Vascular Access: Note the AVF bled on 9/8 after venous needle was pulled. VVS assisted. Try to have an experienced cannulator stick her moving forward.  Hypotension: chronic due to heart failure and/or vasoplegia. Continue midodrine  at 15 mg tid.  Volume: down 2kg today w/ CRRT, close to dry wt, which likely needs to be lowered. Tolerating about 75 cc/hr UF but not doing well w/ higher rates (~ 100 cc/hr causing afib/ RVR twice overnight and this am), will lower UF goal to 65- 85 cc/hr.  Anemia of esrd: Hb 12-15, not on esa at OP unit Secondary hyperPTH: no acute issue Chronic systolic HF/  RV dysfunction/ valve disease: not a candidate for surgical or other invasive therapies  due to ongoing cocaine  and high risk  A fib/FL: on amio, no BB due to hypotension, no anticoag due to h/o subdural hematoma.    Myer Fret  MD  [facility] 09/13/2024, 11:46 AM  Recent Labs  Lab 09/12/24 1146 09/12/24 1649 09/13/24 0349  HGB 12.6  --  10.3*  ALBUMIN   --  1.9* 2.0*  CALCIUM   --  8.9 8.9  PHOS  --  5.2* 3.8  CREATININE  --  3.77* 2.12*  K 4.9 4.7 4.4    Inpatient medications:  amiodarone   200 mg Oral BID   Chlorhexidine  Gluconate Cloth  6 each Topical Daily   feeding supplement (NEPRO CARB STEADY)  237 mL Oral TID BM   feeding supplement (PROSource TF20)  60 mL Per Tube TID   folic acid   1 mg Oral Daily   heparin   5,000 Units Subcutaneous Q8H   hydrocortisone  sod succinate (SOLU-CORTEF ) inj  100 mg Intravenous Q12H   midodrine   15 mg Oral TID WC   modafinil   100 mg Per Tube Daily   multivitamin  1 tablet Oral QHS   mupirocin  ointment  1 Application Nasal BID   mouth rinse  15 mL Mouth Rinse 4 times per day   sertraline   25 mg Oral Daily   [START ON  09/17/2024] thiamine  (VITAMIN B1) injection  100 mg Intravenous Q24H    dextrose  10 % and 0.45 % NaCl Stopped (09/13/24 0741)   feeding supplement (VITAL AF 1.2 CAL) 30 mL/hr at 09/13/24 1100   norepinephrine  (LEVOPHED ) Adult infusion     prismasol  BGK 4/2.5 1,200 mL/hr at 09/13/24 0733   prismasol  BGK 4/2.5 400 mL/hr at 09/13/24 0314   prismasol  BGK 4/2.5 400 mL/hr at 09/13/24 0316   thiamine  (VITAMIN B1) injection Stopped (09/13/24 0940)   Followed by   NOREEN ON 09/14/2024] thiamine  (VITAMIN B1) injection     acetaminophen , alteplase , docusate sodium , heparin , heparin , lidocaine  (PF), lidocaine -prilocaine , LORazepam , melatonin, midodrine , naLOXone  (NARCAN )  injection, mouth rinse, oxyCODONE  **OR** oxyCODONE , pentafluoroprop-tetrafluoroeth, polyethylene glycol

## 2024-09-14 DIAGNOSIS — Z66 Do not resuscitate: Secondary | ICD-10-CM | POA: Diagnosis not present

## 2024-09-14 DIAGNOSIS — Z515 Encounter for palliative care: Secondary | ICD-10-CM | POA: Diagnosis not present

## 2024-09-14 DIAGNOSIS — I5043 Acute on chronic combined systolic (congestive) and diastolic (congestive) heart failure: Secondary | ICD-10-CM | POA: Diagnosis not present

## 2024-09-14 DIAGNOSIS — Z7189 Other specified counseling: Secondary | ICD-10-CM | POA: Diagnosis not present

## 2024-09-14 DIAGNOSIS — R4589 Other symptoms and signs involving emotional state: Secondary | ICD-10-CM | POA: Diagnosis not present

## 2024-09-14 LAB — CBC
HCT: 31.6 % — ABNORMAL LOW (ref 36.0–46.0)
Hemoglobin: 10.4 g/dL — ABNORMAL LOW (ref 12.0–15.0)
MCH: 28.4 pg (ref 26.0–34.0)
MCHC: 32.9 g/dL (ref 30.0–36.0)
MCV: 86.3 fL (ref 80.0–100.0)
Platelets: 191 K/uL (ref 150–400)
RBC: 3.66 MIL/uL — ABNORMAL LOW (ref 3.87–5.11)
RDW: 21.4 % — ABNORMAL HIGH (ref 11.5–15.5)
WBC: 14.2 K/uL — ABNORMAL HIGH (ref 4.0–10.5)
nRBC: 4.9 % — ABNORMAL HIGH (ref 0.0–0.2)

## 2024-09-14 LAB — RENAL FUNCTION PANEL
Albumin: 2.2 g/dL — ABNORMAL LOW (ref 3.5–5.0)
Anion gap: 15 (ref 5–15)
BUN: 32 mg/dL — ABNORMAL HIGH (ref 6–20)
CO2: 20 mmol/L — ABNORMAL LOW (ref 22–32)
Calcium: 9.3 mg/dL (ref 8.9–10.3)
Chloride: 98 mmol/L (ref 98–111)
Creatinine, Ser: 1.27 mg/dL — ABNORMAL HIGH (ref 0.44–1.00)
GFR, Estimated: 55 mL/min — ABNORMAL LOW (ref >60)
Glucose, Bld: 134 mg/dL — ABNORMAL HIGH (ref 70–99)
Phosphorus: 2.3 mg/dL — ABNORMAL LOW (ref 2.5–4.6)
Potassium: 4.4 mmol/L (ref 3.5–5.1)
Sodium: 133 mmol/L — ABNORMAL LOW (ref 135–145)

## 2024-09-14 LAB — COOXEMETRY PANEL
Carboxyhemoglobin: 1.3 % (ref 0.5–1.5)
Methemoglobin: 0.9 % (ref 0.0–1.5)
O2 Saturation: 50.8 %
Total hemoglobin: 11.1 g/dL — ABNORMAL LOW (ref 12.0–16.0)

## 2024-09-14 LAB — MAGNESIUM: Magnesium: 2.6 mg/dL — ABNORMAL HIGH (ref 1.7–2.4)

## 2024-09-14 LAB — GLUCOSE, CAPILLARY
Glucose-Capillary: 122 mg/dL — ABNORMAL HIGH (ref 70–99)
Glucose-Capillary: 136 mg/dL — ABNORMAL HIGH (ref 70–99)
Glucose-Capillary: 140 mg/dL — ABNORMAL HIGH (ref 70–99)
Glucose-Capillary: 142 mg/dL — ABNORMAL HIGH (ref 70–99)
Glucose-Capillary: 145 mg/dL — ABNORMAL HIGH (ref 70–99)

## 2024-09-14 LAB — APTT: aPTT: 48 s — ABNORMAL HIGH (ref 24–36)

## 2024-09-14 NOTE — Progress Notes (Signed)
 Daily Progress Note   Date: 09/14/2024   Patient Name: [name]  DOB: April 15, 1981  MRN: [id number]  Age / Sex: 43 y.o., female  Attending Physician: [name] Toribio BROCKS, MD Primary Care Physician: [name] Greig PARAS, NP Admit Date: 08/31/2024 Length of Stay: 14 days  Reason for Follow-up: Establishing goals of care and Psychosocial/spiritual support  Past Medical History:  Diagnosis Date   Anemia    low iron  - receives iron  at dialysis   Anxiety    Arthritis    RA   Atrial fibrillation with RVR (HCC) 12/14/2023   Chronic systolic congestive heart failure (HCC) 03/16/2016   Dyspnea    ESRD (end stage renal disease) (HCC)    [facility] _ [place]   H/O pericarditis 01/17/2013   H/O pleural effusion 01/17/2013   Heart murmur    Lupus (systemic lupus erythematosus) (HCC)    Previously followed with Dr. Everlean, has not followed up recently   Lupus nephritis [facility]) 2006   Renal biopsy shows segmental endocapillary proliferation and cellular crescent formation (Class IIIA) and lupus membranous glomerulopathy (Class V, stage II)   Pneumonia    many times   Polysubstance abuse (HCC)    cocaine , MJ, tobacco   S/P pericardiocentesis 01/17/2013   H/o pericardial effusion with tamponade 2006    Seizures (HCC)    during pregnancy 1 time   Septic shock (HCC) 12/13/2023   Streptococcal bacteremia 01/23/2013   She had two S. pneumonae bacteremia on 01/21/2013. Sensitive to Peniccilin     Subjective:   Subjective: Chart Reviewed. Updates received. Patient Assessed. Created space and opportunity for patient  and family to explore thoughts and feelings regarding current medical situation.  Today's Discussion: Today before meeting with the patient/family, I reviewed the chart notes including nursing note from today.  Later in the day I reviewed nephrology note and cardiology note from today.  Labs reviewed include CBC which shows mild bump in white count from 14.2 yesterday to 15.1  today in the setting of respiratory failure and pneumonia.  Renal function panel stable hyponatremia at 134, normal potassium at 5.0, bump in creatinine to 2.14 today from 1.27 yesterday in the setting of ESRD now on CRRT.  Prior to seeing the patient at the bedside I spoke with the bedside nurse.  The patient seems to achieve euvolemia not currently on pressors.  Plan is to stop CRRT shortly and attempt IHD on Saturday.  Today I saw the patient at the bedside, she is resting currently but awakens easily to voice.  She denies pain, nausea, vomiting.  She is quite tired and sleepy.  She is not very interactive.  We discussed stopping CRRT, will try IHD in the next day or 2 to see how she tolerates.  She understands.  Later in the day I attempted to call the patient's mother and was unsuccessful.  At this point we will need some time for outcomes.  However, I fear that options have been exhausted as previously stated by PCCM.  After seeing the patient I discussed with PCCM physician Dr. Claudene.  She does not tolerate max attempted IHD will likely need to discuss transition to comfort care.  I provided emotional and general support through therapeutic listening, empathy, sharing of stories, , and other techniques. I answered all questions and addressed all concerns to the best of my ability.  Review of Systems  Constitutional:  Positive for fatigue.       Denies pain in general  Gastrointestinal:  Negative for abdominal pain, nausea and vomiting.  Neurological:  Positive for weakness.    Objective:   Primary Diagnoses: Present on Admission:  Acute respiratory failure with hypoxia (HCC)  Acute on chronic combined systolic and diastolic CHF (congestive heart failure) (HCC)  Atrial flutter (HCC)  Fluid overload  Hemodialysis catheter malfunction (HCC)  Insomnia  Lupus (systemic lupus erythematosus) (HCC)   Vital Signs:  BP (!) 128/53   Pulse (!) 118   Temp (!) 97 F (36.1 [name]) (Oral)    Resp (!) 27   Ht 5' 2 (1.575 m)   Wt 35.2 kg   LMP  (LMP Unknown) Comment: states over 2 years ago  SpO2 100%   BMI 14.19 kg/m   Physical Exam Vitals and nursing note reviewed.  Constitutional:      Appearance: She is underweight. She is ill-appearing.     Comments: Minimally conversational, sleepy/lethargic  HENT:     Head: Normocephalic and atraumatic.  Cardiovascular:     Rate and Rhythm: Normal rate. Rhythm irregular.  Pulmonary:     Effort: Pulmonary effort is normal. No respiratory distress.  Abdominal:     General: Abdomen is flat.  Skin:    General: Skin is warm and dry.  Neurological:     General: No focal deficit present.     Mental Status: She is alert.     Palliative Assessment/Data: 20-30%   Existing Vynca/ACP Documentation: None  Assessment & Plan:   HPI/Patient Profile:  43 y.o. 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  She was admitted on 08/31/2024 with acute hypoxic respiratory failure, pulmonary edema, pneumonia, acute on chronic heart failure, mitral regurg, tricuspid regurg, aortic regurg, atrial fibrillation, ESRD, lupus nephritis, AGMA, hyperkalemia, severe protein calorie malnutrition, unintentional weight loss, physical deconditioning/generalized weakness, and others.    Palliative medicine was consulted for GOC conversations.  SUMMARY OF RECOMMENDATIONS   DNR-interventions desired Anticipate trial of iHD on Saturday If she does not tolerate iHD will need to have discussions about transitioning to comfort care as she will be out of options at that point Palliative medicine will continue to follow  Symptom Management:  Per primary team Palliative medicine is available to assist as needed  Code Status: DNR -  Prearrest Interventions Desired  Prognosis: Unable to determine  Discharge Planning: To Be Determined  Discussed with: Patient, medical team, nursing  Thank you for allowing us  to participate in the care of RENNEE COYNE PMT will continue to support holistically.  Billing based on MDM: Moderate  Detailed review of medical records (labs, imaging, vital signs), medically appropriate exam, discussed with treatment team, counseling and education to patient, family, & staff, documenting clinical information, medication management, coordination of care  [name], NP Palliative Medicine Team  Team Phone # [phone] (Nights/Weekends)  08/26/2021, 8:17 AM

## 2024-09-14 NOTE — Progress Notes (Signed)
 Advanced Heart Failure Rounding Note  Cardiologist: Shelda Bruckner, MD  Chief Complaint: Multi system organ failure 2/2 advanced lupus  Subjective:    - Euvolemic on exam today  - CVP 7 with large V waves  Objective:   Weight Range: 35.2 kg Body mass index is 14.19 kg/m.   Vital Signs:   Temp:  [96.5 F (35.8 C)-97.3 F (36.3 C)] 97 F (36.1 C) (09/18 0400) Pulse Rate:  [90-122] 106 (09/18 0945) Resp:  [12-36] 15 (09/18 1130) SpO2:  [90 %-100 %] 99 % (09/18 0945) Arterial Line BP: (104-134)/(24-48) 110/36 (09/18 1130) Weight:  [35.2 kg] 35.2 kg (09/18 0500) Last BM Date : 09/14/24  Weight change: Filed Weights   09/12/24 0500 09/13/24 0500 09/14/24 0500  Weight: 39.2 kg 37.9 kg 35.2 kg    Intake/Output:   Intake/Output Summary (Last 24 hours) at 09/14/2024 1238 Last data filed at 09/14/2024 1111 Gross per 24 hour  Intake 1485.16 ml  Output 3018.8 ml  Net -1533.64 ml    Physical Exam  General:  frail; cachectic Neck: JVD w/ large V waves Cor: tachycardic, regular Lungs: clear, diminished bases Extremities: no edema Telemetry   reviewed  Labs    CBC Recent Labs    09/13/24 0349 09/14/24 0413  WBC 15.0* 14.2*  HGB 10.3* 10.4*  HCT 31.6* 31.6*  MCV 86.1 86.3  PLT 209 191   Basic Metabolic Panel Recent Labs    90/82/74 0349 09/13/24 1628 09/14/24 0413  NA 129* 134* 133*  K 4.4 4.5 4.4  CL 101 101 98  CO2 18* 19* 20*  GLUCOSE 139* 140* 134*  BUN 39* 30* 32*  CREATININE 2.12* 1.48* 1.27*  CALCIUM  8.9 9.3 9.3  MG 2.5*  --  2.6*  PHOS 3.8 3.0 2.3*   Liver Function Tests Recent Labs    09/13/24 1628 09/14/24 0413  ALBUMIN  2.0* 2.2*   No results for input(s): LIPASE, AMYLASE in the last 72 hours. Cardiac Enzymes No results for input(s): CKTOTAL, CKMB, CKMBINDEX, TROPONINI in the last 72 hours.  BNP: BNP (last 3 results) Recent Labs    05/18/24 2210 08/31/24 0725 09/01/24 0413  BNP >4,500.0* >4,500.0*  >4,500.0*    ProBNP (last 3 results) No results for input(s): PROBNP in the last 8760 hours.   D-Dimer No results for input(s): DDIMER in the last 72 hours. Hemoglobin A1C No results for input(s): HGBA1C in the last 72 hours. Fasting Lipid Panel No results for input(s): CHOL, HDL, LDLCALC, TRIG, CHOLHDL, LDLDIRECT in the last 72 hours. Thyroid Function Tests No results for input(s): TSH, T4TOTAL, T3FREE, THYROIDAB in the last 72 hours.  Invalid input(s): FREET3  Other results:   Imaging    No results found.    Medications:     Scheduled Medications:  amiodarone   200 mg Oral BID   Chlorhexidine  Gluconate Cloth  6 each Topical Daily   feeding supplement (PROSource TF20)  60 mL Per Tube TID   folic acid   1 mg Oral Daily   heparin   5,000 Units Subcutaneous Q8H   hydrocortisone  sod succinate (SOLU-CORTEF ) inj  100 mg Intravenous Q12H   midodrine   15 mg Oral TID WC   modafinil   100 mg Per Tube Daily   multivitamin  1 tablet Oral QHS   mupirocin  ointment  1 Application Nasal BID   mouth rinse  15 mL Mouth Rinse 4 times per day   sertraline   25 mg Oral Daily   [START ON 09/17/2024] thiamine  (VITAMIN B1)  injection  100 mg Intravenous Q24H    Infusions:  dextrose  10 % and 0.45 % NaCl Stopped (09/13/24 0741)   feeding supplement (VITAL AF 1.2 CAL) 50 mL/hr at 09/14/24 1111   norepinephrine  (LEVOPHED ) Adult infusion     thiamine  (VITAMIN B1) injection      PRN Medications: acetaminophen , alteplase , docusate sodium , heparin , lidocaine  (PF), lidocaine -prilocaine , LORazepam , melatonin, midodrine , naLOXone  (NARCAN )  injection, mouth rinse, oxyCODONE  **OR** oxyCODONE , pentafluoroprop-tetrafluoroeth, polyethylene glycol    Patient Profile  [name] is a 43 y.o. 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  Admitted with multiorgan failure due to advanced lupus.   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  - CVP now down to 7; discussed with nephrology & CCM. Will stop CRRT today and attempt iHD in the next 48H. If she does not tolerate it unfortunately will have to move towards comfort care measures.    HFrEF with RV failure - Echo 9/25: EF 30-35%, LV with GHK, RV severely reduced, LA/RA severely dilated, mod MR, severe TR - see above   ESRD Lupus nephritis - Has been having difficulty tolerating iHD. Has required levo in the past - Back on CRRT as of 9/16. Plan for 24-48 hr trial.  - Continue midodrine  15 mg TID   Valvular heart disease - Echo 9/25 with mod MR, severe TR and mod-severe aortic valve regurgitation - severe TR on RHC 9/16 - Not a candidate for intervention with frailty   A fib/flutter - A fib 90s on tele - Continue amiodarone  200 mg BID - off AC with hx of SDH and recurrent falls   Hx cocaine  dependence - last + 5/25   Respiratory failure - required BiPAP 9/12 - Stable on [place] now - PCCM following   Cachexia/ Frailty Protein malnutrition>>Hypoglycemia Deconditioning - per primary team - Palliative care last saw 9/11, at that time patient wished for full code and full scope of care. Discussions ongoing.  - Now with cortrak  GOC: Now DNR/DNI    [name], DO  09/14/2024, 12:38 PM  Advanced Heart Failure Team Pager [phone] (M-F; 7a - 5p)  Please contact [facility] for night-coverage after hours (5p -7a ) and weekends on [email or web address]  CRITICAL CARE Performed by: [name]   Total critical care time: 35 minutes  Critical care time was exclusive of separately billable procedures and treating other patients.  Critical care was necessary to  treat or prevent imminent or life-threatening deterioration.  Critical care was time spent personally by me on the following activities: development of treatment plan with patient and/or surrogate as well as nursing, discussions with consultants, evaluation of patient's response to treatment, examination of patient, obtaining history from patient or surrogate, ordering and performing treatments and interventions, ordering and review of laboratory studies, ordering and review of radiographic studies, pulse oximetry and re-evaluation of patient's condition.

## 2024-09-14 NOTE — Progress Notes (Signed)
 [facility] Progress Note   Subjective:    Net UF yest was 2.0 L  CVP this morning around 5-7    Objective Vitals:   09/14/24 1045 09/14/24 1100 09/14/24 1115 09/14/24 1130  BP:      Pulse:      Resp: 13 14 15 15   Temp:      TempSrc:      SpO2:      Weight:      Height:       Physical Exam Gen: cachectic, ill-appearing, on HFNC 3 L  Heart: RRR; No MRGs Lungs: normal WOB on HFNC Abdomen: Soft and non-tender Extremities: No LE edema Dialysis Access: AVF +t/b - weak but present   Dialysis Orders: 3h  B400   37.5kg   2K bath  AVF  Heparin  none Post 9/02 wt 38.2kg, usual comes off 0-1.5kg over Hb 12, no esa   Home bp meds: Midodrine  10 tid  Assessment/Plan: SOB/ pulm edema: thin margin between hypo and hypervolemia in setting of advanced cardiac disease. PCWP 29 mmHg on RHC 9/16, w/ severe RHF and severe TV regurgitation. Did CRRT trial for about 48 hrs and is now 4kg down after the CRRT. Will try iHD on Sat.  Volume: UF 2 L yest, wts down 35.2kg (2 kg under).  ESRD: on HD TTS. Unable to tolerate iHD well and unable to get sufficient volume off w/ iHD. Stopping CRRT today. Next HD Sat.  Vascular Access: Note the AVF bled on 9/8 after venous needle was pulled. VVS assisted. Try to have an experienced cannulator stick her moving forward.  Hypotension: chronic due to heart failure and/or vasoplegia. Continue midodrine  at 15 mg tid.  Anemia of esrd: Hb 10- 12, not on esa at OP unit Chronic systolic HF/  RV dysfunction/ valve disease: not a candidate for surgical or other invasive therapies due to ongoing cocaine  and high risk  A fib/FL: on amio, no BB due to hypotension, no anticoag due to h/o subdural hematoma.    [name] Fret  MD  [facility] 09/14/2024, 11:48 AM  Recent Labs  Lab 09/13/24 0349 09/13/24 1628 09/14/24 0413  HGB 10.3*  --  10.4*  ALBUMIN  2.0* 2.0* 2.2*  CALCIUM  8.9 9.3 9.3  PHOS 3.8 3.0 2.3*  CREATININE 2.12* 1.48* 1.27*  K 4.4 4.5 4.4     Inpatient medications:  amiodarone   200 mg Oral BID   Chlorhexidine  Gluconate Cloth  6 each Topical Daily   feeding supplement (PROSource TF20)  60 mL Per Tube TID   folic acid   1 mg Oral Daily   heparin   5,000 Units Subcutaneous Q8H   hydrocortisone  sod succinate (SOLU-CORTEF ) inj  100 mg Intravenous Q12H   midodrine   15 mg Oral TID WC   modafinil   100 mg Per Tube Daily   multivitamin  1 tablet Oral QHS   mupirocin  ointment  1 Application Nasal BID   mouth rinse  15 mL Mouth Rinse 4 times per day   sertraline   25 mg Oral Daily   [START ON 09/17/2024] thiamine  (VITAMIN B1) injection  100 mg Intravenous Q24H    dextrose  10 % and 0.45 % NaCl Stopped (09/13/24 0741)   feeding supplement (VITAL AF 1.2 CAL) 50 mL/hr at 09/14/24 1111   norepinephrine  (LEVOPHED ) Adult infusion     thiamine  (VITAMIN B1) injection     acetaminophen , alteplase , docusate sodium , heparin , lidocaine  (PF), lidocaine -prilocaine , LORazepam , melatonin, midodrine , naLOXone  (NARCAN )  injection, mouth rinse, oxyCODONE  **OR** oxyCODONE , pentafluoroprop-tetrafluoroeth,  polyethylene glycol

## 2024-09-14 NOTE — Plan of Care (Signed)
  Problem: Clinical Measurements: Goal: Ability to maintain clinical measurements within normal limits will improve Outcome: Progressing Goal: Will remain free from infection Outcome: Progressing Goal: Diagnostic test results will improve Outcome: Progressing Goal: Respiratory complications will improve Outcome: Progressing Goal: Cardiovascular complication will be avoided Outcome: Progressing   Problem: Activity: Goal: Risk for activity intolerance will decrease Outcome: Progressing   Problem: Nutrition: Goal: Adequate nutrition will be maintained Outcome: Progressing   Problem: Coping: Goal: Level of anxiety will decrease Outcome: Progressing   Problem: Elimination: Goal: Will not experience complications related to bowel motility Outcome: Progressing Goal: Will not experience complications related to urinary retention Outcome: Progressing   Problem: Pain Managment: Goal: General experience of comfort will improve and/or be controlled Outcome: Progressing   Problem: Safety: Goal: Ability to remain free from injury will improve Outcome: Progressing   Problem: Skin Integrity: Goal: Risk for impaired skin integrity will decrease Outcome: Progressing   Problem: Education: Goal: Individualized Educational Video(s) Outcome: Progressing   Problem: Fluid Volume: Goal: Compliance with measures to maintain balanced fluid volume will improve Outcome: Progressing   Problem: Health Behavior/Discharge Planning: Goal: Ability to manage health-related needs will improve Outcome: Progressing   Problem: Nutritional: Goal: Ability to make healthy dietary choices will improve Outcome: Progressing   Problem: Clinical Measurements: Goal: Complications related to the disease process, condition or treatment will be avoided or minimized Outcome: Progressing   Problem: Education: Goal: Ability to describe self-care measures that may prevent or decrease complications (Diabetes  Survival Skills Education) will improve Outcome: Progressing Goal: Individualized Educational Video(s) Outcome: Progressing   Problem: Coping: Goal: Ability to adjust to condition or change in health will improve Outcome: Progressing   Problem: Fluid Volume: Goal: Ability to maintain a balanced intake and output will improve Outcome: Progressing   Problem: Health Behavior/Discharge Planning: Goal: Ability to identify and utilize available resources and services will improve Outcome: Progressing Goal: Ability to manage health-related needs will improve Outcome: Progressing   Problem: Metabolic: Goal: Ability to maintain appropriate glucose levels will improve Outcome: Progressing   Problem: Nutritional: Goal: Maintenance of adequate nutrition will improve Outcome: Progressing Goal: Progress toward achieving an optimal weight will improve Outcome: Progressing   Problem: Skin Integrity: Goal: Risk for impaired skin integrity will decrease Outcome: Progressing   Problem: Tissue Perfusion: Goal: Adequacy of tissue perfusion will improve Outcome: Progressing   Problem: Education: Goal: Ability to demonstrate management of disease process will improve Outcome: Progressing Goal: Ability to verbalize understanding of medication therapies will improve Outcome: Progressing Goal: Individualized Educational Video(s) Outcome: Progressing   Problem: Activity: Goal: Capacity to carry out activities will improve Outcome: Progressing   Problem: Cardiac: Goal: Ability to achieve and maintain adequate cardiopulmonary perfusion will improve Outcome: Progressing   Problem: Education: Goal: Understanding of CV disease, CV risk reduction, and recovery process will improve Outcome: Progressing Goal: Individualized Educational Video(s) Outcome: Progressing   Problem: Activity: Goal: Ability to return to baseline activity level will improve Outcome: Progressing   Problem:  Cardiovascular: Goal: Ability to achieve and maintain adequate cardiovascular perfusion will improve Outcome: Progressing Goal: Vascular access site(s) Level 0-1 will be maintained Outcome: Progressing   Problem: Health Behavior/Discharge Planning: Goal: Ability to safely manage health-related needs after discharge will improve Outcome: Progressing

## 2024-09-15 DIAGNOSIS — I132 Hypertensive heart and chronic kidney disease with heart failure and with stage 5 chronic kidney disease, or end stage renal disease: Secondary | ICD-10-CM | POA: Diagnosis not present

## 2024-09-15 DIAGNOSIS — E8770 Fluid overload, unspecified: Secondary | ICD-10-CM

## 2024-09-15 DIAGNOSIS — I5043 Acute on chronic combined systolic (congestive) and diastolic (congestive) heart failure: Secondary | ICD-10-CM | POA: Diagnosis not present

## 2024-09-15 DIAGNOSIS — N186 End stage renal disease: Secondary | ICD-10-CM | POA: Diagnosis not present

## 2024-09-15 DIAGNOSIS — Z992 Dependence on renal dialysis: Secondary | ICD-10-CM | POA: Diagnosis not present

## 2024-09-15 DIAGNOSIS — I5082 Biventricular heart failure: Secondary | ICD-10-CM | POA: Diagnosis not present

## 2024-09-15 LAB — BASIC METABOLIC PANEL WITH GFR
Anion gap: 16 — ABNORMAL HIGH (ref 5–15)
Anion gap: 17 — ABNORMAL HIGH (ref 5–15)
BUN: 78 mg/dL — ABNORMAL HIGH (ref 6–20)
BUN: 113 mg/dL — ABNORMAL HIGH (ref 6–20)
CO2: 18 mmol/L — ABNORMAL LOW (ref 22–32)
CO2: 17 mmol/L — ABNORMAL LOW (ref 22–32)
Calcium: 9.6 mg/dL (ref 8.9–10.3)
Calcium: 9.4 mg/dL (ref 8.9–10.3)
Chloride: 100 mmol/L (ref 98–111)
Chloride: 98 mmol/L (ref 98–111)
Creatinine, Ser: 2.14 mg/dL — ABNORMAL HIGH (ref 0.44–1.00)
Creatinine, Ser: 2.71 mg/dL — ABNORMAL HIGH (ref 0.44–1.00)
GFR, Estimated: 29 mL/min — ABNORMAL LOW (ref >60)
GFR, Estimated: 22 mL/min — ABNORMAL LOW (ref >60)
Glucose, Bld: 153 mg/dL — ABNORMAL HIGH (ref 70–99)
Glucose, Bld: 150 mg/dL — ABNORMAL HIGH (ref 70–99)
Potassium: 5 mmol/L (ref 3.5–5.1)
Potassium: 4.9 mmol/L (ref 3.5–5.1)
Sodium: 134 mmol/L — ABNORMAL LOW (ref 135–145)
Sodium: 132 mmol/L — ABNORMAL LOW (ref 135–145)

## 2024-09-15 LAB — CBC
HCT: 32.4 % — ABNORMAL LOW (ref 36.0–46.0)
Hemoglobin: 10.7 g/dL — ABNORMAL LOW (ref 12.0–15.0)
MCH: 29.2 pg (ref 26.0–34.0)
MCHC: 33 g/dL (ref 30.0–36.0)
MCV: 88.3 fL (ref 80.0–100.0)
Platelets: 182 K/uL (ref 150–400)
RBC: 3.67 MIL/uL — ABNORMAL LOW (ref 3.87–5.11)
RDW: 22.4 % — ABNORMAL HIGH (ref 11.5–15.5)
WBC: 15.1 K/uL — ABNORMAL HIGH (ref 4.0–10.5)
nRBC: 22.3 % — ABNORMAL HIGH (ref 0.0–0.2)

## 2024-09-15 LAB — GLUCOSE, CAPILLARY
Glucose-Capillary: 111 mg/dL — ABNORMAL HIGH (ref 70–99)
Glucose-Capillary: 135 mg/dL — ABNORMAL HIGH (ref 70–99)
Glucose-Capillary: 142 mg/dL — ABNORMAL HIGH (ref 70–99)
Glucose-Capillary: 147 mg/dL — ABNORMAL HIGH (ref 70–99)
Glucose-Capillary: 153 mg/dL — ABNORMAL HIGH (ref 70–99)
Glucose-Capillary: 159 mg/dL — ABNORMAL HIGH (ref 70–99)

## 2024-09-15 LAB — MAGNESIUM: Magnesium: 2.9 mg/dL — ABNORMAL HIGH (ref 1.7–2.4)

## 2024-09-15 MED ORDER — ALBUMIN HUMAN 25 % IV SOLN
25.0000 g | INTRAVENOUS | Status: AC | PRN
  Administered 2024-09-16: 25 g via INTRAVENOUS
  Filled 2024-09-15: qty 100

## 2024-09-15 MED ORDER — CHLORHEXIDINE GLUCONATE CLOTH 2 % EX PADS
6.0000 | MEDICATED_PAD | Freq: Every day | CUTANEOUS | Status: DC
  Administered 2024-09-15 – 2024-09-19 (×5): 6 via TOPICAL

## 2024-09-15 NOTE — Treatment Plan (Signed)
 Occupational Therapy Treatment Patient Details Name: [name] MRN: [id number] DOB: 1981/10/13 Today's Date: 09/15/2024   History of present illness 43 yo female admitted 08/31/24 for acute RF with hypoxia. 9/8 -9/12 progressive unit and on 9/12 ICU new bipap requirements and Hypotensive. PMH ESRD on HD, heart murmur, lupus, polysubstance abuse, seizures, chronic R SDH,cocaine  dependence, protein malnutrition hypotensive,   OT comments  Pt in room with 2 family members restless in the bed on arrival. Pt without gown and reports feeling hot. Pt allowed chap stick and taking sips of water . Pt repositioned on L side during session to try to help discomfort on sacrum. Recommendation for air mattress overlay due to pressure in supine. Recommendation for skilled inpatient follow up therapy, <3 hours/day.       If plan is discharge home, recommend the following:  A lot of help with walking and/or transfers;A lot of help with bathing/dressing/bathroom   Equipment Recommendations  BSC/3in1;Wheelchair (measurements OT);Wheelchair cushion (measurements OT);Hospital bed    Recommendations for Other Services PT consult    Precautions / Restrictions Precautions Precautions: Fall Precaution/Restrictions Comments: watch sats and BP, R Femoral line, CRRT Restrictions Weight Bearing Restrictions Per Provider Order: No Other Position/Activity Restrictions: IV line to R hip area       Mobility Bed Mobility Overal bed mobility: Needs Assistance Bed Mobility: Rolling Rolling: Contact guard assist         General bed mobility comments: rolling onto L side and hips flexed. Line checked and RN checked line    Transfers                   General transfer comment: defer too fatigued     Balance                                           ADL either performed or assessed with clinical judgement   ADL Overall ADL's : Needs assistance/impaired Eating/Feeding:  Moderate assistance Eating/Feeding Details (indicate cue type and reason): cues for sips to control pace Grooming: Maximal assistance Grooming Details (indicate cue type and reason): applied chap stick to lips                                    Extremity/Trunk Assessment Upper Extremity Assessment Upper Extremity Assessment: Generalized weakness LUE Deficits / Details: severe pain with any tactile input. pt with dressing on forearm. RN aware of sisters concerns for bandage dressing   Lower Extremity Assessment Lower Extremity Assessment: Generalized weakness        Vision   Additional Comments: pt not fully opening eyes this session   Perception     Praxis     Communication Communication Communication: Impaired Factors Affecting Communication: Reduced clarity of speech   Cognition Arousal: Lethargic Behavior During Therapy: Flat affect Cognition: Cognition impaired             OT - Cognition Comments: pt mumbling at times not to anyone.pt when asked directly does communicate some needs. pt needs reinforcement that she can eat with NG tube                 Following commands: Impaired        Cueing      Exercises      Shoulder Instructions  General Comments poor pulse ox read. reports feeling SOB. RN notified. RN trying several methods. RN turning up OT 4L [place]    Pertinent Vitals/ Pain       Pain Assessment Pain Assessment: Faces Pain Score: 10-Worst pain ever Pain Descriptors / Indicators: Restless Pain Intervention(s): Repositioned, Limited activity within patient's tolerance  Home Living                                          Prior Functioning/Environment              Frequency  Min 1X/week        Progress Toward Goals  OT Goals(current goals can now be found in the care plan section)  Progress towards OT goals: Not progressing toward goals - comment  Acute Rehab OT Goals Patient Stated  Goal: to get lines out of neck OT Goal Formulation: With patient Time For Goal Achievement: 09/25/24 Potential to Achieve Goals: Fair ADL Goals Pt Will Perform Grooming: sitting;with modified independence Pt Will Perform Upper Body Bathing: with set-up;sitting Pt Will Perform Lower Body Bathing: with min assist;with adaptive equipment;sit to/from stand Pt Will Perform Lower Body Dressing: with min assist;with adaptive equipment;sit to/from stand Pt Will Transfer to Toilet: with mod assist;stand pivot transfer;bedside commode Additional ADL Goal #1: Patient will demonstrate ability to participate in 5 or more minutes of a functional or therapeutic task without the need for a rest break. Additional ADL Goal #2: Patient will demonstrate ability to independently state 4 energy conservation strategies to increase safety and independence with functional tasks.  Plan      Co-evaluation                 AM-PAC OT 6 Clicks Daily Activity     Outcome Measure   Help from another person eating meals?: A Lot Help from another person taking care of personal grooming?: A Lot Help from another person toileting, which includes using toliet, bedpan, or urinal?: A Lot Help from another person bathing (including washing, rinsing, drying)?: A Lot Help from another person to put on and taking off regular upper body clothing?: A Lot Help from another person to put on and taking off regular lower body clothing?: A Lot 6 Click Score: 12    End of Session Equipment Utilized During Treatment: Oxygen  OT Visit Diagnosis: Unsteadiness on feet (R26.81)   Activity Tolerance Patient limited by fatigue   Patient Left in bed;with call bell/phone within reach;with family/visitor present;with nursing/sitter in room   Nurse Communication Mobility status;Precautions        Time: 8571-8554 OT Time Calculation (min): 17 min  Charges: OT General Charges $OT Visit: 1 Visit OT Treatments $Self Care/Home  Management : 8-22 mins   Brynn, OTR/L  Acute Rehabilitation Services Office: [phone] .   Ely Molt 09/15/2024, 3:36 PM

## 2024-09-15 NOTE — Plan of Care (Signed)
   Problem: Nutrition: Goal: Adequate nutrition will be maintained Outcome: Progressing   Problem: Coping: Goal: Level of anxiety will decrease Outcome: Progressing   Problem: Safety: Goal: Ability to remain free from injury will improve Outcome: Progressing

## 2024-09-15 NOTE — Progress Notes (Signed)
 [facility] Progress Note   Subjective:    CRRT dc'd yesterday I/O yest were even Wt's are stable at 35kg    Objective Vitals:   09/15/24 0615 09/15/24 0630 09/15/24 0645 09/15/24 0700  BP:      Pulse: 97 (!) 106 (!) 102 (!) 101  Resp: 14 20 19 13   Temp:      TempSrc:      SpO2: 100% 100% 100% 100%  Weight:      Height:       Physical Exam Gen: cachectic, ill-appearing, on HFNC 3 L  Heart: RRR; No MRGs Lungs: normal WOB on HFNC Abdomen: Soft and non-tender Extremities: No LE edema Dialysis Access: AVF +t/b - weak but present   Dialysis Orders: TTS HD  3h  B400   37.5kg   2K bath  AVF  Heparin  none Post 9/02 wt 38.2kg, usual comes off 0-1.5kg over Hb 12, no esa   Home bp meds: Midodrine  10 tid  Assessment/Plan: SOB/ pulm edema: thin margin between hypo and hypervolemia in setting of advanced cardiac disease. PCWP 29 mmHg on RHC 9/16, w/ severe RHF and severe TV regurgitation. Did CRRT trial for about 48 hrs with 4kg drop in wts. Next step is to try iHD tomorrow.  Volume: no edema, 2kg under dry wt today ESRD: on HD TTS. Was unable to tolerate iHD well and unable to get sufficient volume off w/ iHD. Next HD Sat as above Vascular access:  AVF bled on 9/8 after venous needle was pulled. VVS assisted. Try to have an experienced cannulator stick her moving forward.  Hypotension: chronic due to heart failure and/or vasoplegia. Continue midodrine  at 15 mg tid.  Anemia of esrd: Hb 10- 12, not on esa at OP unit Chronic systolic HF/  RV dysfunction/ valve disease: not a candidate for surgical or other invasive therapies due to ongoing cocaine  and high risk  A fib/FL: on amio, no BB due to hypotension, no anticoag due to h/o subdural hematoma.  GOC: poor prognosis due to severe RHF, hypotension, debility   Myer Fret  MD  [facility] 09/15/2024, 10:27 AM  Recent Labs  Lab 09/13/24 1628 09/14/24 0413 09/15/24 0412  HGB  --  10.4* 10.7*  ALBUMIN  2.0* 2.2*   --   CALCIUM  9.3 9.3 9.6  PHOS 3.0 2.3*  --   CREATININE 1.48* 1.27* 2.14*  K 4.5 4.4 5.0    Inpatient medications:  amiodarone   200 mg Oral BID   Chlorhexidine  Gluconate Cloth  6 each Topical Daily   feeding supplement (PROSource TF20)  60 mL Per Tube TID   folic acid   1 mg Oral Daily   heparin   5,000 Units Subcutaneous Q8H   hydrocortisone  sod succinate (SOLU-CORTEF ) inj  100 mg Intravenous Q12H   midodrine   15 mg Oral TID WC   modafinil   100 mg Per Tube Daily   multivitamin  1 tablet Oral QHS   mupirocin  ointment  1 Application Nasal BID   mouth rinse  15 mL Mouth Rinse 4 times per day   sertraline   25 mg Oral Daily   [START ON 09/17/2024] thiamine  (VITAMIN B1) injection  100 mg Intravenous Q24H    feeding supplement (VITAL AF 1.2 CAL) 50 mL/hr at 09/15/24 0700   thiamine  (VITAMIN B1) injection Stopped (09/14/24 2236)   acetaminophen , alteplase , docusate sodium , heparin , lidocaine  (PF), lidocaine -prilocaine , LORazepam , melatonin, midodrine , naLOXone  (NARCAN )  injection, mouth rinse, oxyCODONE  **OR** oxyCODONE , pentafluoroprop-tetrafluoroeth, polyethylene glycol

## 2024-09-15 NOTE — Plan of Care (Signed)
  Problem: Clinical Measurements: Goal: Ability to maintain clinical measurements within normal limits will improve Outcome: Progressing Goal: Will remain free from infection Outcome: Progressing Goal: Diagnostic test results will improve Outcome: Progressing Goal: Respiratory complications will improve Outcome: Progressing Goal: Cardiovascular complication will be avoided Outcome: Progressing   Problem: Activity: Goal: Risk for activity intolerance will decrease Outcome: Progressing   Problem: Nutrition: Goal: Adequate nutrition will be maintained Outcome: Progressing   Problem: Coping: Goal: Level of anxiety will decrease Outcome: Progressing   Problem: Elimination: Goal: Will not experience complications related to bowel motility Outcome: Progressing Goal: Will not experience complications related to urinary retention Outcome: Progressing   Problem: Pain Managment: Goal: General experience of comfort will improve and/or be controlled Outcome: Progressing   Problem: Safety: Goal: Ability to remain free from injury will improve Outcome: Progressing   Problem: Skin Integrity: Goal: Risk for impaired skin integrity will decrease Outcome: Progressing   Problem: Education: Goal: Individualized Educational Video(s) Outcome: Progressing   Problem: Fluid Volume: Goal: Compliance with measures to maintain balanced fluid volume will improve Outcome: Progressing   Problem: Health Behavior/Discharge Planning: Goal: Ability to manage health-related needs will improve Outcome: Progressing   Problem: Nutritional: Goal: Ability to make healthy dietary choices will improve Outcome: Progressing   Problem: Clinical Measurements: Goal: Complications related to the disease process, condition or treatment will be avoided or minimized Outcome: Progressing   Problem: Education: Goal: Ability to describe self-care measures that may prevent or decrease complications (Diabetes  Survival Skills Education) will improve Outcome: Progressing Goal: Individualized Educational Video(s) Outcome: Progressing   Problem: Coping: Goal: Ability to adjust to condition or change in health will improve Outcome: Progressing   Problem: Fluid Volume: Goal: Ability to maintain a balanced intake and output will improve Outcome: Progressing   Problem: Health Behavior/Discharge Planning: Goal: Ability to identify and utilize available resources and services will improve Outcome: Progressing Goal: Ability to manage health-related needs will improve Outcome: Progressing   Problem: Metabolic: Goal: Ability to maintain appropriate glucose levels will improve Outcome: Progressing   Problem: Nutritional: Goal: Maintenance of adequate nutrition will improve Outcome: Progressing Goal: Progress toward achieving an optimal weight will improve Outcome: Progressing   Problem: Skin Integrity: Goal: Risk for impaired skin integrity will decrease Outcome: Progressing   Problem: Tissue Perfusion: Goal: Adequacy of tissue perfusion will improve Outcome: Progressing   Problem: Education: Goal: Ability to demonstrate management of disease process will improve Outcome: Progressing Goal: Ability to verbalize understanding of medication therapies will improve Outcome: Progressing Goal: Individualized Educational Video(s) Outcome: Progressing   Problem: Activity: Goal: Capacity to carry out activities will improve Outcome: Progressing   Problem: Cardiac: Goal: Ability to achieve and maintain adequate cardiopulmonary perfusion will improve Outcome: Progressing   Problem: Education: Goal: Understanding of CV disease, CV risk reduction, and recovery process will improve Outcome: Progressing Goal: Individualized Educational Video(s) Outcome: Progressing   Problem: Activity: Goal: Ability to return to baseline activity level will improve Outcome: Progressing   Problem:  Cardiovascular: Goal: Ability to achieve and maintain adequate cardiovascular perfusion will improve Outcome: Progressing Goal: Vascular access site(s) Level 0-1 will be maintained Outcome: Progressing   Problem: Health Behavior/Discharge Planning: Goal: Ability to safely manage health-related needs after discharge will improve Outcome: Progressing

## 2024-09-15 NOTE — Plan of Care (Signed)
     Referral previously received for [name] for goals of care discussion. Noted most recent palliative in-person assessment dated 09/14/2024 at which time I was unable to reach the patient's mother for update.  Chart reviewed for Recent provider notes, nurse notes, vitals, and labs and updates received from RN.   I was unable to see other patient until the patient's mother was present at the bedside.  I talked to her went to the check-in and update with family.  At this time patient appears stable off CRRT, plans to attempt iHD tomorrow.  I discussed with the patient's mother that if the patient does not tolerate intermittent hemodialysis then they would not plan to restart CRRT and we would essentially be out of options at that time and need to have discussions about transition to comfort care.  She seems to understand.  I answered all questions and shared that I would return tomorrow to check on the patient and family.  Please contact the palliative medicine provider on service for any new/urgent needs that require our assistance with this patient.  Thank you for your referral and allowing PMT to assist in [name]'s care.   [name], NP Palliative Medicine Team Phone: [phone]  NO CHARGE

## 2024-09-15 NOTE — Progress Notes (Addendum)
 NAME:  GENNIE EISINGER, MRN:  [id number], DOB:  1981/08/04, LOS: 15 ADMISSION DATE:  08/31/2024, CONSULTATION DATE:  08/31/24 REFERRING MD: Dr. 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 Also had decreased appetite and fatigue. Denies fevers, chills. In ED, started on BiPAP. Able to transition to 6L [place]. CXR with pulmonary edema. K 5.3 BNP elevated. Normal WBC. LA 6.2>3.2. Given levaquin . SBP readings low-normal with intermittent readings with SBP in 60s and 70s but mentating well and eating a chicken wrap. PCCM and Nephrology consulted. Plan for hemodialysis in ICU.   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 HD 1.1L 9/5: HD removed 1 with levophed  9/6: HD removed 2L with levophed  9/7: Weaning pressors 9/8 moved out of ICU. Palliative care consulted. Also seen by vascular surg for bleeding of right AV fistula site. Suture placed. Pressure held. Advised against fistulogram, and advised HD cath if issues in future. On-going hypotension w/ HD noted.  9/9 -9/11 on-going supportive care on 9/12 poor po intake. No appetite, no endurance, not able to complete meal.  Started on dextrose  gtt. Called again for hypotension. Got 250 ML and placed supine BP improved.  9/12 transferred back to ICU for hypotension, hypoxia after fluid  bolus with very high BiPAP settings 9/13 iHD, able to come off bipap 9/16 right heart catheterization >> severe biventricular failure with elevated filling pressures, severe TR (PAP 40/30 (33), PA OP 29)  Interim History / Subjective:  Off pressors 3 L/min CVP 15    Objective   Blood pressure (!) 128/53, pulse (!) 101, temperature (!) 96.4 F (35.8 C), temperature source Axillary, resp. rate 13, height 5' 2 (1.575 m), weight 35.6 kg, SpO2 100%. CVP:  [8 mmHg-49 mmHg] 15 mmHg      Intake/Output Summary (Last 24 hours) at 09/15/2024 0713 Last data filed at 09/15/2024 0700 Gross per 24 hour  Intake 1685.49 ml  Output 542.2 ml  Net 1143.29 ml   Filed Weights   09/13/24 0500 09/14/24 0500 09/15/24 0417  Weight: 37.9 kg 35.2 kg 35.6 kg   Physical Exam: General: Frail thin woman, laying in bed in no distress Neuro: Wakes easily, tracks, follows commands but globally severely weak.  Answers questions with one-word responses HEENT: Oropharynx clear, no stridor, no secretions Cardiovascular: Irregular, distant, no murmur.  No edema Lungs: Scattered inspiratory crackles, no wheezing Abdomen: Nondistended and nontender, positive bowel sounds Musculoskeletal: No deformities Skin: No rash    Resolved Hospital Problem list   Acute on chronic resp failure AGMA Hyperkalemia  Hypocalcemia  Bleeding from AVF site suture placed   Assessment & Plan:   Multiorgan failure due to complications of advanced lupus: ESRD, vasoplegia, cardioplegia. Biventricular failure. MR, AI.  -Transitioned off CVVHD on 9/18 - Remains off pressors, wide pulse pressure on A-line but SBP 115 - Appreciate heart failure team, nephrology team management - Midodrine  as ordered -  Continue hydrocortisone  for now, currently 100 mg every 12 hours - In setting of cardiorenal syndrome if she is unable to tolerate IHD then would not restart CVVH, will need to transition to comfort  Hypoxia: poor perfusion, hard to  trust pleth. PaO2 on ABG 144, 77 -Wean FiO2 as able, currently 3 L/min - Pulmonary hygiene - Optimizing volume status as able with limitations by cardiorenal syndrome noted  Severe cachexia, deconditioning -Continue tube feeding - Follow electrolytes closely, at high risk for refeeding syndrome - Thiamine  supplementation ordered - Appreciate dietitian assistance  Atrial fib with some runs of RVR -Continue amiodarone  - Off anticoagulation  Hypoglycemia: Have been chasing CBGs that remain chronically low: local milieu in fingerbeds suspicious for chronic catabolic state so question accuracy. - off D10 1/2NS, following CBG, restart if indicated - Continue TF  Leukocytosis: -Afebrile, following - Question related to hydrocortisone , started 9/15  History cocaine  dependence:   -Supportive care  Critical care time 31 minutes   Lamar Chris, MD, PhD 09/15/2024, 7:18 AM [facility] [phone] or if no answer before 7:00PM call [phone] For any issues after 7:00PM please call eLink [phone]

## 2024-09-15 NOTE — Progress Notes (Signed)
 Advanced Heart Failure Rounding Note  Cardiologist: Shelda Bruckner, MD  HF Consulting Cardiologist: Dr. Gardenia  Chief Complaint: Multi system organ failure 2/2 advanced lupus   Subjective:    Very fatigued and in pain today.   Refusing PO medications. Appetite is poor.  Unable to obtain CVP. RN troubleshooting.   Objective:   Weight Range: 35.6 kg Body mass index is 14.35 kg/m.   Vital Signs:   Temp:  [96.4 F (35.8 C)-97.6 F (36.4 C)] 96.4 F (35.8 C) (09/19 0400) Pulse Rate:  [95-106] 101 (09/19 0700) Resp:  [8-39] 13 (09/19 0700) SpO2:  [87 %-100 %] 100 % (09/19 0700) Arterial Line BP: (106-127)/(26-65) 115/33 (09/19 0700) Weight:  [35.6 kg] 35.6 kg (09/19 0417) Last BM Date : 09/14/24  Weight change: Filed Weights   09/13/24 0500 09/14/24 0500 09/15/24 0417  Weight: 37.9 kg 35.2 kg 35.6 kg    Intake/Output:   Intake/Output Summary (Last 24 hours) at 09/15/2024 1153 Last data filed at 09/15/2024 0700 Gross per 24 hour  Intake 1185.83 ml  Output --  Net 1185.83 ml    Physical Exam  General:  Cachectic. Frail. Cor: JVP 7-8 w/ prominent v waves. Regular rhythm, tachy. No murmurs. Lungs: diminished. Breathing nonlabored Extremities: no edema   Telemetry   reviewed  Labs    CBC Recent Labs    09/14/24 0413 09/15/24 0412  WBC 14.2* 15.1*  HGB 10.4* 10.7*  HCT 31.6* 32.4*  MCV 86.3 88.3  PLT 191 182   Basic Metabolic Panel Recent Labs    90/82/74 1628 09/14/24 0413 09/15/24 0412  NA 134* 133* 134*  K 4.5 4.4 5.0  CL 101 98 100  CO2 19* 20* 18*  GLUCOSE 140* 134* 153*  BUN 30* 32* 78*  CREATININE 1.48* 1.27* 2.14*  CALCIUM  9.3 9.3 9.6  MG  --  2.6* 2.9*  PHOS 3.0 2.3*  --    Liver Function Tests Recent Labs    09/13/24 1628 09/14/24 0413  ALBUMIN  2.0* 2.2*   No results for input(s): LIPASE, AMYLASE in the last 72 hours. Cardiac Enzymes No results for input(s): CKTOTAL, CKMB, CKMBINDEX, TROPONINI  in the last 72 hours.  BNP: BNP (last 3 results) Recent Labs    05/18/24 2210 08/31/24 0725 09/01/24 0413  BNP >4,500.0* >4,500.0* >4,500.0*    ProBNP (last 3 results) No results for input(s): PROBNP in the last 8760 hours.   D-Dimer No results for input(s): DDIMER in the last 72 hours. Hemoglobin A1C No results for input(s): HGBA1C in the last 72 hours. Fasting Lipid Panel No results for input(s): CHOL, HDL, LDLCALC, TRIG, CHOLHDL, LDLDIRECT in the last 72 hours. Thyroid Function Tests No results for input(s): TSH, T4TOTAL, T3FREE, THYROIDAB in the last 72 hours.  Invalid input(s): FREET3  Other results:   Imaging    No results found.    Medications:     Scheduled Medications:  amiodarone   200 mg Oral BID   Chlorhexidine  Gluconate Cloth  6 each Topical Daily   Chlorhexidine  Gluconate Cloth  6 each Topical Q0600   feeding supplement (PROSource TF20)  60 mL Per Tube TID   folic acid   1 mg Oral Daily   heparin   5,000 Units Subcutaneous Q8H   hydrocortisone  sod succinate (SOLU-CORTEF ) inj  100 mg Intravenous Q12H   midodrine   15 mg Oral TID WC   modafinil   100 mg Per Tube Daily   multivitamin  1 tablet Oral QHS   mouth rinse  15  mL Mouth Rinse 4 times per day   sertraline   25 mg Oral Daily   [START ON 09/17/2024] thiamine  (VITAMIN B1) injection  100 mg Intravenous Q24H    Infusions:  [START ON 09/16/2024] albumin  human     feeding supplement (VITAL AF 1.2 CAL) 50 mL/hr at 09/15/24 0700   thiamine  (VITAMIN B1) injection 250 mg (09/15/24 1127)    PRN Medications: acetaminophen , [START ON 09/16/2024] albumin  human, alteplase , docusate sodium , heparin , lidocaine  (PF), lidocaine -prilocaine , LORazepam , melatonin, midodrine , naLOXone  (NARCAN )  injection, mouth rinse, oxyCODONE  **OR** oxyCODONE , pentafluoroprop-tetrafluoroeth, polyethylene glycol    Patient Profile  [name] ALVIAR is a 43 y.o. 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 Admitted with multiorgan failure due to advanced lupus.   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  - Volume okay on exam. Attempting iHD on 09/20. If unable to tolerate will need to transition towards comfort measures. - On midodrine  15 TID. Refusing po meds today.   HFrEF with RV failure - Echo 9/25: EF 30-35%, LV with GHK, RV severely reduced, LA/RA severely dilated, mod MR, severe TR - see above   ESRD Lupus nephritis - Has been having difficulty tolerating iHD. Has required levo in the past - CRRT started 9/16, off 9/18. Trialing iHD on 09/20. - Continue midodrine  15 mg TID   Valvular heart disease - Echo 9/25 with mod MR, severe TR and mod-severe aortic valve regurgitation - severe TR on RHC 9/16 - Not a candidate for intervention with frailty   A fib/flutter - ? Afib vs MAT on telemetry. Rate 90s-100s - Continue amiodarone  200 mg BID - off AC with hx of SDH and recurrent falls   Hx cocaine  dependence - last + 5/25   Respiratory failure - required BiPAP 9/12 - [place] now - PCCM following   Cachexia/ Frailty Protein malnutrition>>Hypoglycemia Deconditioning - per primary team - Now with cortrak  GOC: Now DNR/DNI - Palliative care following for ongoing discussions    [facility], Dechelle Attaway N, PA-C  09/15/2024, 11:53 AM  Advanced Heart Failure Team Pager [phone] (M-F; 7a - 5p)  Please contact [facility] for night-coverage after hours (5p -7a ) and weekends on [email or web address]

## 2024-09-16 DIAGNOSIS — N186 End stage renal disease: Secondary | ICD-10-CM | POA: Diagnosis not present

## 2024-09-16 DIAGNOSIS — I132 Hypertensive heart and chronic kidney disease with heart failure and with stage 5 chronic kidney disease, or end stage renal disease: Secondary | ICD-10-CM | POA: Diagnosis not present

## 2024-09-16 DIAGNOSIS — Z515 Encounter for palliative care: Secondary | ICD-10-CM | POA: Diagnosis not present

## 2024-09-16 DIAGNOSIS — Z992 Dependence on renal dialysis: Secondary | ICD-10-CM | POA: Diagnosis not present

## 2024-09-16 DIAGNOSIS — Z66 Do not resuscitate: Secondary | ICD-10-CM | POA: Diagnosis not present

## 2024-09-16 DIAGNOSIS — Z7189 Other specified counseling: Secondary | ICD-10-CM | POA: Diagnosis not present

## 2024-09-16 DIAGNOSIS — E162 Hypoglycemia, unspecified: Secondary | ICD-10-CM | POA: Diagnosis not present

## 2024-09-16 DIAGNOSIS — I5082 Biventricular heart failure: Secondary | ICD-10-CM | POA: Diagnosis not present

## 2024-09-16 LAB — BASIC METABOLIC PANEL WITH GFR
Anion gap: 19 — ABNORMAL HIGH (ref 5–15)
BUN: 142 mg/dL — ABNORMAL HIGH (ref 6–20)
CO2: 14 mmol/L — ABNORMAL LOW (ref 22–32)
Calcium: 9.2 mg/dL (ref 8.9–10.3)
Chloride: 98 mmol/L (ref 98–111)
Creatinine, Ser: 3.16 mg/dL — ABNORMAL HIGH (ref 0.44–1.00)
GFR, Estimated: 18 mL/min — ABNORMAL LOW (ref >60)
Glucose, Bld: 153 mg/dL — ABNORMAL HIGH (ref 70–99)
Potassium: 5.2 mmol/L — ABNORMAL HIGH (ref 3.5–5.1)
Sodium: 131 mmol/L — ABNORMAL LOW (ref 135–145)

## 2024-09-16 LAB — PHOSPHORUS: Phosphorus: 4.2 mg/dL (ref 2.5–4.6)

## 2024-09-16 LAB — CBC
HCT: 31.3 % — ABNORMAL LOW (ref 36.0–46.0)
Hemoglobin: 10.4 g/dL — ABNORMAL LOW (ref 12.0–15.0)
MCH: 29 pg (ref 26.0–34.0)
MCHC: 33.2 g/dL (ref 30.0–36.0)
MCV: 87.2 fL (ref 80.0–100.0)
Platelets: 174 K/uL (ref 150–400)
RBC: 3.59 MIL/uL — ABNORMAL LOW (ref 3.87–5.11)
RDW: 21.6 % — ABNORMAL HIGH (ref 11.5–15.5)
WBC: 17 K/uL — ABNORMAL HIGH (ref 4.0–10.5)
nRBC: 55 % — ABNORMAL HIGH (ref 0.0–0.2)

## 2024-09-16 LAB — GLUCOSE, CAPILLARY
Glucose-Capillary: 153 mg/dL — ABNORMAL HIGH (ref 70–99)
Glucose-Capillary: 130 mg/dL — ABNORMAL HIGH (ref 70–99)
Glucose-Capillary: 106 mg/dL — ABNORMAL HIGH (ref 70–99)
Glucose-Capillary: 81 mg/dL (ref 70–99)
Glucose-Capillary: 77 mg/dL (ref 70–99)

## 2024-09-16 LAB — MAGNESIUM: Magnesium: 3 mg/dL — ABNORMAL HIGH (ref 1.7–2.4)

## 2024-09-16 MED ORDER — OXYCODONE HCL 5 MG PO TABS
7.5000 mg | ORAL_TABLET | Freq: Four times a day (QID) | ORAL | Status: DC | PRN
Start: 2024-09-16 — End: 2024-09-17

## 2024-09-16 MED ORDER — NEPRO/CARBSTEADY PO LIQD: 237.0000 mL | ORAL | Status: DC | PRN

## 2024-09-16 MED ORDER — PENTAFLUOROPROP-TETRAFLUOROETH EX AERO: 1.0000 | INHALATION_SPRAY | CUTANEOUS | Status: DC | PRN

## 2024-09-16 MED ORDER — SERTRALINE HCL 25 MG PO TABS
25.0000 mg | ORAL_TABLET | Freq: Every day | ORAL | Status: DC
  Administered 2024-09-16 – 2024-09-20 (×5): 25 mg
  Filled 2024-09-16 (×5): qty 1

## 2024-09-16 MED ORDER — FOLIC ACID 1 MG PO TABS
1.0000 mg | ORAL_TABLET | Freq: Every day | ORAL | Status: DC
  Administered 2024-09-16 – 2024-09-20 (×5): 1 mg
  Filled 2024-09-16 (×5): qty 1

## 2024-09-16 MED ORDER — POLYETHYLENE GLYCOL 3350 17 G PO PACK: 17.0000 g | PACK | Freq: Every day | ORAL | Status: DC | PRN

## 2024-09-16 MED ORDER — OXYCODONE HCL 5 MG PO TABS
5.0000 mg | ORAL_TABLET | Freq: Four times a day (QID) | ORAL | Status: DC | PRN
Start: 2024-09-16 — End: 2024-09-17
  Administered 2024-09-17 (×2): 5 mg
  Filled 2024-09-16 (×2): qty 1

## 2024-09-16 MED ORDER — MIDODRINE HCL 5 MG PO TABS
10.0000 mg | ORAL_TABLET | Freq: Every day | ORAL | Status: DC | PRN
  Administered 2024-09-19: 10 mg
  Filled 2024-09-16: qty 2

## 2024-09-16 MED ORDER — LIDOCAINE HCL (PF) 1 % IJ SOLN: 5.0000 mL | INTRAMUSCULAR | Status: DC | PRN

## 2024-09-16 MED ORDER — ANTICOAGULANT SODIUM CITRATE 4% (200MG/5ML) IV SOLN: 5.0000 mL | Status: DC | PRN

## 2024-09-16 MED ORDER — AMIODARONE HCL 200 MG PO TABS
200.0000 mg | ORAL_TABLET | Freq: Two times a day (BID) | ORAL | Status: DC
  Administered 2024-09-16 – 2024-09-20 (×9): 200 mg
  Filled 2024-09-16 (×9): qty 1

## 2024-09-16 MED ORDER — LIDOCAINE-PRILOCAINE 2.5-2.5 % EX CREA: 1.0000 | TOPICAL_CREAM | CUTANEOUS | Status: DC | PRN

## 2024-09-16 MED ORDER — LORAZEPAM 0.5 MG PO TABS
0.2500 mg | ORAL_TABLET | Freq: Four times a day (QID) | ORAL | Status: DC | PRN
  Administered 2024-09-17: 0.25 mg
  Filled 2024-09-16: qty 1

## 2024-09-16 MED ORDER — LOPERAMIDE HCL 1 MG/7.5ML PO SUSP
2.0000 mg | Freq: Once | ORAL | Status: DC
  Filled 2024-09-16: qty 15

## 2024-09-16 MED ORDER — MELATONIN 3 MG PO TABS: 3.0000 mg | ORAL_TABLET | Freq: Every evening | ORAL | Status: DC | PRN

## 2024-09-16 MED ORDER — ACETAMINOPHEN 325 MG PO TABS
650.0000 mg | ORAL_TABLET | Freq: Four times a day (QID) | ORAL | Status: DC | PRN
  Administered 2024-09-16 – 2024-09-18 (×3): 650 mg
  Filled 2024-09-16 (×4): qty 2

## 2024-09-16 MED ORDER — GERHARDT'S BUTT CREAM
TOPICAL_CREAM | Freq: Every day | CUTANEOUS | Status: DC | PRN
  Administered 2024-09-17: 1 via TOPICAL
  Filled 2024-09-16: qty 60

## 2024-09-16 MED ORDER — MIDODRINE HCL 5 MG PO TABS
15.0000 mg | ORAL_TABLET | Freq: Three times a day (TID) | ORAL | Status: DC
  Administered 2024-09-16 – 2024-09-20 (×12): 15 mg
  Filled 2024-09-16 (×12): qty 3

## 2024-09-16 MED ORDER — LOPERAMIDE HCL 1 MG/7.5ML PO SUSP
2.0000 mg | Freq: Once | ORAL | Status: AC
  Administered 2024-09-16: 2 mg
  Filled 2024-09-16: qty 15

## 2024-09-16 MED ORDER — HEPARIN SODIUM (PORCINE) 1000 UNIT/ML DIALYSIS
1000.0000 [IU] | INTRAMUSCULAR | Status: DC | PRN
  Administered 2024-09-16: 1000 [IU]
  Filled 2024-09-16 (×2): qty 1

## 2024-09-16 MED ORDER — RENA-VITE PO TABS
1.0000 | ORAL_TABLET | Freq: Every day | ORAL | Status: DC
  Administered 2024-09-16 – 2024-09-19 (×4): 1
  Filled 2024-09-16 (×4): qty 1

## 2024-09-16 NOTE — Progress Notes (Signed)
 Daily Progress Note   Date: 09/16/2024   Patient Name: [name]  DOB: 1981/11/14  MRN: [id number]  Age / Sex: 43 y.o., female  Attending Physician: Shelah Lamar RAMAN, MD Primary Care Physician: Lorren Greig PARAS, NP Admit Date: 08/31/2024 Length of Stay: 16 days  Reason for Follow-up: Establishing goals of care and Psychosocial/spiritual support  Past Medical History:  Diagnosis Date   Anemia    low iron  - receives iron  at dialysis   Anxiety    Arthritis    RA   Atrial fibrillation with RVR (HCC) 12/14/2023   Chronic systolic congestive heart failure (HCC) 03/16/2016   Dyspnea    ESRD (end stage renal disease) (HCC)    [facility] _ [place]   H/O pericarditis 01/17/2013   H/O pleural effusion 01/17/2013   Heart murmur    Lupus (systemic lupus erythematosus) (HCC)    Previously followed with Dr. Everlean, has not followed up recently   Lupus nephritis [facility]) 2006   Renal biopsy shows segmental endocapillary proliferation and cellular crescent formation (Class IIIA) and lupus membranous glomerulopathy (Class V, stage II)   Pneumonia    many times   Polysubstance abuse (HCC)    cocaine , MJ, tobacco   S/P pericardiocentesis 01/17/2013   H/o pericardial effusion with tamponade 2006    Seizures (HCC)    during pregnancy 1 time   Septic shock (HCC) 12/13/2023   Streptococcal bacteremia 01/23/2013   She had two S. pneumonae bacteremia on 01/21/2013. Sensitive to Peniccilin     Subjective:   Subjective: Chart Reviewed. Updates received. Patient Assessed. Created space and opportunity for patient  and family to explore thoughts and feelings regarding current medical situation.  Today's Discussion: Today before meeting with the patient/family, I reviewed the chart notes including heart failure note from yesterday, OT note from yesterday, nursing note from yesterday, PCCM note from today.  Later in the day I reviewed nephrology note and nursing note from today.  Labs  reviewed include CBC which shows continued increase in white count from 15.1 yesterday to 17.0 today in the setting of respiratory failure and pneumonia.  Renal function panel with hyponatremia at 131, hyperkalemia at 5.2, bump in creatinine to 3.16 today from 2.71 yesterday as well as BUN continuing to climb from a low of 30 three days ago to 113 yesterday and 142 today in the setting of ESRD now off CRRT with plan to trial iHD for tolerance today.  Prior to seeing the patient at the bedside I spoke with the bedside nurse.  The patient is still planned for dialysis today, although there are concerns that she will not tolerate.  Today I saw the patient at the bedside, she is resting currently but awakens easily to voice.  She denies nausea, vomiting.  She states that she hurts all over, on and off.  She is quite tired and sleepy.  She is not very interactive.  We discussed the plan to trial dialysis today and see if she tolerates.  I shared that if she does not tolerate dialysis we may be out of options and have to talk about how to keep her comfortable.  When I ask her if she understands she says I do.  I told her I would call her mom and speak with her as well and she agrees.  Later in the day I spoke with the patient's mom.  Reiterated the plan of trial for dialysis today (currently underway) and if she does not tolerate  it then we are out of options and would need to talk about how to keep her comfortable.  She seems emotional and over the phone, understandably.  She tells me that she is going to continue to pray that her daughter gets better.  I shared that I would do the same.  I shared that a colleague would check in tomorrow for ongoing support and discussions on how to care for her.  At this point it appears the patient's blood pressure is not tolerating dialysis very well.  I fear that she may not tolerate a full session and we may be out of options and need to transition to comfort.  I  provided emotional and general support through therapeutic listening, empathy, sharing of stories, , and other techniques. I answered all questions and addressed all concerns to the best of my ability.  Review of Systems  Constitutional:  Positive for fatigue.       Describes pain all over that is on and off  Gastrointestinal:  Negative for abdominal pain, nausea and vomiting.  Neurological:  Positive for weakness.    Objective:   Primary Diagnoses: Present on Admission:  Acute respiratory failure with hypoxia (HCC)  Acute on chronic combined systolic and diastolic CHF (congestive heart failure) (HCC)  Atrial flutter (HCC)  Fluid overload  Hemodialysis catheter malfunction (HCC)  Insomnia  Lupus (systemic lupus erythematosus) (HCC)   Vital Signs:  BP (!) 128/53   Pulse 98   Temp 98.4 F (36.9 C) (Oral)   Resp 14   Ht 5' 2 (1.575 m)   Wt 35.2 kg   LMP  (LMP Unknown) Comment: states over 2 years ago  SpO2 95%   BMI 14.19 kg/m   Physical Exam Vitals and nursing note reviewed.  Constitutional:      General: She is sleeping. She is not in acute distress.    Appearance: She is underweight. She is ill-appearing.     Comments: Minimally conversational, sleepy/lethargic  HENT:     Head: Normocephalic and atraumatic.  Cardiovascular:     Rate and Rhythm: Normal rate. Rhythm irregular.  Pulmonary:     Effort: Pulmonary effort is normal. No respiratory distress.  Abdominal:     General: Abdomen is flat.  Skin:    General: Skin is warm and dry.  Neurological:     General: No focal deficit present.     Mental Status: She is easily aroused.     Palliative Assessment/Data: 20-30%   Existing Vynca/ACP Documentation: None  Assessment & Plan:   HPI/Patient Profile:  43 y.o. 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  She was admitted on 08/31/2024 with acute hypoxic respiratory failure, pulmonary edema, pneumonia, acute on chronic heart failure, mitral regurg, tricuspid regurg, aortic regurg, atrial fibrillation, ESRD, lupus nephritis, AGMA, hyperkalemia, severe protein calorie malnutrition, unintentional weight loss, physical deconditioning/generalized weakness, and others.    Palliative medicine was consulted for GOC conversations.  SUMMARY OF RECOMMENDATIONS   DNR-interventions desired Anticipate trial of iHD today If she does not tolerate iHD will need to have discussions about transitioning to comfort care as she will be out of options at that point I will sign out to my colleague Jocelyn Cooper, PA to follow-up tomorrow Palliative medicine will continue to  follow  Symptom Management:  Per primary team Palliative medicine is available to assist as needed  Code Status: DNR - Prearrest Interventions Desired  Prognosis: < 2 weeks  Discharge Planning: To Be Determined  Discussed with: Patient, patient's family, medical team, nursing  Thank you for allowing us  to participate in the care of LENNIX ROTUNDO PMT will continue to support holistically.  Billing based on MDM: Moderate  Detailed review of medical records (labs, imaging, vital signs), medically appropriate exam, discussed with treatment team, counseling and education to patient, family, & staff, documenting clinical information, medication management, coordination of care  [name], NP Palliative Medicine Team  Team Phone # [phone] (Nights/Weekends)  08/26/2021, 8:17 AM

## 2024-09-16 NOTE — Progress Notes (Signed)
 [facility] Progress Note   Subjective:    CRRT dc'd yesterday I/O yest +1.1L  Wt's are stable at 35kg  CVP 16- 24 Remains off pressors   Objective Vitals:   09/16/24 0700 09/16/24 0800 09/16/24 0900 09/16/24 1121  BP:      Pulse: 92 (!) 104 98   Resp: 17  14   Temp:    (!) 96.7 F (35.9 C)  TempSrc:    Axillary  SpO2: 100% 100% 95%   Weight:      Height:       Physical Exam Gen: cachectic, ill-appearing, on HFNC 3 L  Heart: RRR; No MRGs Lungs: normal WOB on HFNC Abdomen: Soft and non-tender Extremities: No LE edema Dialysis Access: AVF +t/b - weak but present   Dialysis Orders: TTS HD  3h  B400   37.5kg   2K bath  AVF  Heparin  none Post 9/02 wt 38.2kg, usual comes off 0-1.5kg over Hb 12, no esa   Home bp meds: Midodrine  10 tid  Assessment/Plan: SOB/ pulm edema: thin margin between hypo and hypervolemia in setting of advanced cardiac disease. PCWP 29 mmHg on RHC 9/16, w/ severe RHF and severe TV regurgitation. Did CRRT trial for about 48 hrs with 4kg drop in wts. Next step is to try iHD today.  Volume: no edema, remains 2kg under, on RA ESRD: on HD TTS. Was unable to tolerate iHD well and unable to get sufficient volume off w/ iHD. SP CRRT trial 9/16- 9/18. Trying iHD now today.  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 VVS assisted. Try to have an experienced cannulator stick her moving forward.  Chronic systolic HF/  RV dysfunction/ valve disease: not a candidate for surgical or other invasive therapies due to ongoing cocaine  and high risk  A fib/FL: on amio, no BB due to hypotension, no anticoag due to h/o subdural hematoma.  GOC: poor prognosis due to severe RHF, hypotension, debility   Rob Geralynn  MD  [facility] 09/16/2024, 11:54 AM  Recent Labs  Lab 09/13/24 1628 09/14/24 0413  09/15/24 0412 09/15/24 1635 09/16/24 0443  HGB  --  10.4* 10.7*  --  10.4*  ALBUMIN  2.0* 2.2*  --   --   --   CALCIUM  9.3 9.3 9.6 9.4 9.2  PHOS 3.0 2.3*  --   --  4.2  CREATININE 1.48* 1.27* 2.14* 2.71* 3.16*  K 4.5 4.4 5.0 4.9 5.2*    Inpatient medications:  amiodarone   200 mg Per Tube BID   Chlorhexidine  Gluconate Cloth  6 each Topical Daily   Chlorhexidine  Gluconate Cloth  6 each Topical Q0600   feeding supplement (PROSource TF20)  60 mL Per Tube TID   folic acid   1 mg Per Tube Daily   heparin   5,000 Units Subcutaneous Q8H   hydrocortisone  sod succinate (SOLU-CORTEF ) inj  100 mg Intravenous Q12H   midodrine   15 mg Per Tube TID WC   modafinil   100 mg Per Tube Daily   multivitamin  1 tablet Per Tube QHS   mouth rinse  15 mL Mouth Rinse 4 times per day   sertraline   25 mg Per Tube Daily   [START ON 09/17/2024] thiamine  (VITAMIN B1) injection  100 mg Intravenous Q24H    albumin  human     feeding supplement (VITAL AF 1.2  CAL) Stopped (09/16/24 0530)   thiamine  (VITAMIN B1) injection 250 mg (09/16/24 1029)   acetaminophen , albumin  human, alteplase , docusate sodium , heparin , lidocaine  (PF), lidocaine -prilocaine , LORazepam , melatonin, midodrine , naLOXone  (NARCAN )  injection, mouth rinse, oxyCODONE  **OR** oxyCODONE , pentafluoroprop-tetrafluoroeth, polyethylene glycol

## 2024-09-16 NOTE — Plan of Care (Signed)
?  Problem: Clinical Measurements: ?Goal: Will remain free from infection ?Outcome: Progressing ?Goal: Diagnostic test results will improve ?Outcome: Progressing ?Goal: Respiratory complications will improve ?Outcome: Progressing ?  ?

## 2024-09-16 NOTE — Progress Notes (Signed)
 Contacted Dr. Geralynn to give update on blood pressures. UF turned back on per his instructions to remove 500 L, continue with order regarding BP.

## 2024-09-16 NOTE — Progress Notes (Signed)
 Call placed to Dr. Geralynn regarding low blood pressure. Informed pt was given 25 g of albumin  with no response of increase blood pressure. Blood pressure went down to 77/44. A bolus of 250 mL of saline was ordered.

## 2024-09-16 NOTE — Progress Notes (Signed)
 NAME:  [name], MRN:  [id number], DOB:  01/01/1981, LOS: 16 ADMISSION DATE:  08/31/2024, CONSULTATION DATE:  08/31/24 REFERRING MD: Dr. 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 Also had decreased appetite and fatigue. Denies fevers, chills. In ED, started on BiPAP. Able to transition to 6L [place]. CXR with pulmonary edema. K 5.3 BNP elevated. Normal WBC. LA 6.2>3.2. Given levaquin . SBP readings low-normal with intermittent readings with SBP in 60s and 70s but mentating well and eating a chicken wrap. PCCM and Nephrology consulted. Plan for hemodialysis in ICU.   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 HD 1.1L 9/5: HD removed 1 with levophed  9/6: HD removed 2L with levophed  9/7: Weaning pressors 9/8 moved out of ICU. Palliative care consulted. Also seen by vascular surg for bleeding of right AV fistula site. Suture placed. Pressure held. Advised against fistulogram, and advised HD cath if issues in future. On-going hypotension w/ HD noted.  9/9 -9/11 on-going supportive care on 9/12 poor po intake. No appetite, no endurance, not able to complete meal.  Started on dextrose  gtt. Called again for hypotension. Got 250 ML and placed supine BP improved.  9/12 transferred back to ICU for hypotension, hypoxia after fluid  bolus with very high BiPAP settings 9/13 iHD, able to come off bipap 9/16 right heart catheterization >> severe biventricular failure with elevated filling pressures, severe TR (PAP 40/30 (33), PA OP 29)  Interim History / Subjective:  She has been refusing p.o. medications, some of her care plan Remains off pressors   Objective   Blood pressure (!) 128/53, pulse 92, temperature 98.4 F (36.9 C), temperature source Oral, resp. rate 17, height 5' 2 (1.575 m), weight 35.2 kg, SpO2 100%.        Intake/Output Summary (Last 24 hours) at 09/16/2024 0753 Last data filed at 09/16/2024 0556 Gross per 24 hour  Intake 1057.5 ml  Output 2 ml  Net 1055.5 ml   Filed Weights   09/14/24 0500 09/15/24 0417 09/16/24 0454  Weight: 35.2 kg 35.6 kg 35.2 kg   Physical Exam: General: Frail cachectic woman laying in bed Neuro: Wakes, tracks, follows commands, severely globally weak.  One-word responses HEENT: Oropharynx clear, no stridor or secretions Cardiovascular: Distant, regular, no murmur, no edema Lungs: Moderate respiratory effort, few scattered crackles Abdomen: Nondistended, nontender, positive bowel sounds Musculoskeletal: No deformity Skin: No rash    Resolved Hospital Problem list   Acute on chronic resp failure AGMA Hyperkalemia  Hypocalcemia  Bleeding from AVF site suture placed   Assessment & Plan:   Multiorgan failure due to complications of advanced lupus: ESRD, vasoplegia, cardioplegia. Biventricular failure. MR, AI.  -Off CVVHD 9/18 and would not restart based on overall prognosis.  Unclear that she will tolerate intermittent HD without pressors.  If not then need to transition to comfort.  Based on her overall status, intermittent refusal of  care (including midodrine ) may need to transition to comfort in any event - Will continue the midodrine  as ordered if she will take it - Remains off pressors, wide pulse pressure on her A-line - Continue hydrocortisone  for now 100  mg every 12 hours  Hypoxia: poor perfusion, hard to trust pleth. PaO2 on ABG 144, 77 -Continue nasal cannula oxygen.  Not a candidate for intubation, likely poor candidate for BiPAP if it were to be required - Push pulmonary hygiene - Optimizing volume status with limitations by cardiorenal syndrome noted  Severe cachexia, deconditioning -Tube feeding - Follow electrolytes and watching closely for refeeding syndrome - Thiamine  supplementation - Appreciate dietitian assistance  Atrial fib with some runs of RVR -Amiodarone  is ordered but she has been refusing p.o. meds - Off anticoagulation  Hypoglycemia: Have been chasing CBGs that remain chronically low: local milieu in fingerbeds suspicious for chronic catabolic state so question accuracy. - Remains D10 1/2NS, continue to follow CBG, would restart if indicated -Continue TF  Leukocytosis: -Afebrile, following -Remains on hydrocortisone , started 9/15  History cocaine  dependence:   -Supportive care  Critical care time 31 minutes   Lamar Chris, MD, PhD 09/16/2024, 7:53 AM [facility] [phone] or if no answer before 7:00PM call [phone] For any issues after 7:00PM please call eLink [phone]

## 2024-09-17 ENCOUNTER — Inpatient Hospital Stay (HOSPITAL_COMMUNITY)

## 2024-09-17 DIAGNOSIS — I5082 Biventricular heart failure: Secondary | ICD-10-CM | POA: Diagnosis not present

## 2024-09-17 DIAGNOSIS — M3214 Glomerular disease in systemic lupus erythematosus: Secondary | ICD-10-CM | POA: Diagnosis not present

## 2024-09-17 DIAGNOSIS — I132 Hypertensive heart and chronic kidney disease with heart failure and with stage 5 chronic kidney disease, or end stage renal disease: Secondary | ICD-10-CM | POA: Diagnosis not present

## 2024-09-17 DIAGNOSIS — Z992 Dependence on renal dialysis: Secondary | ICD-10-CM | POA: Diagnosis not present

## 2024-09-17 DIAGNOSIS — Z7189 Other specified counseling: Secondary | ICD-10-CM | POA: Diagnosis not present

## 2024-09-17 DIAGNOSIS — N186 End stage renal disease: Secondary | ICD-10-CM | POA: Diagnosis not present

## 2024-09-17 DIAGNOSIS — E872 Acidosis, unspecified: Secondary | ICD-10-CM | POA: Diagnosis not present

## 2024-09-17 DIAGNOSIS — Z515 Encounter for palliative care: Secondary | ICD-10-CM | POA: Diagnosis not present

## 2024-09-17 LAB — BASIC METABOLIC PANEL WITH GFR
Anion gap: 20 — ABNORMAL HIGH (ref 5–15)
BUN: 81 mg/dL — ABNORMAL HIGH (ref 6–20)
CO2: 14 mmol/L — ABNORMAL LOW (ref 22–32)
Calcium: 9.3 mg/dL (ref 8.9–10.3)
Chloride: 96 mmol/L — ABNORMAL LOW (ref 98–111)
Creatinine, Ser: 2.26 mg/dL — ABNORMAL HIGH (ref 0.44–1.00)
GFR, Estimated: 27 mL/min — ABNORMAL LOW (ref >60)
Glucose, Bld: 110 mg/dL — ABNORMAL HIGH (ref 70–99)
Potassium: 4.3 mmol/L (ref 3.5–5.1)
Sodium: 130 mmol/L — ABNORMAL LOW (ref 135–145)

## 2024-09-17 LAB — GLUCOSE, CAPILLARY
Glucose-Capillary: 101 mg/dL — ABNORMAL HIGH (ref 70–99)
Glucose-Capillary: 108 mg/dL — ABNORMAL HIGH (ref 70–99)
Glucose-Capillary: 110 mg/dL — ABNORMAL HIGH (ref 70–99)
Glucose-Capillary: 139 mg/dL — ABNORMAL HIGH (ref 70–99)
Glucose-Capillary: 47 mg/dL — ABNORMAL LOW (ref 70–99)
Glucose-Capillary: 61 mg/dL — ABNORMAL LOW (ref 70–99)
Glucose-Capillary: 77 mg/dL (ref 70–99)
Glucose-Capillary: 78 mg/dL (ref 70–99)
Glucose-Capillary: 87 mg/dL (ref 70–99)
Glucose-Capillary: 88 mg/dL (ref 70–99)

## 2024-09-17 LAB — MAGNESIUM: Magnesium: 2.5 mg/dL — ABNORMAL HIGH (ref 1.7–2.4)

## 2024-09-17 LAB — PHOSPHORUS: Phosphorus: 5.7 mg/dL — ABNORMAL HIGH (ref 2.5–4.6)

## 2024-09-17 MED ORDER — DEXTROSE 50 % IV SOLN
INTRAVENOUS | Status: AC
  Filled 2024-09-17: qty 50

## 2024-09-17 MED ORDER — OXYCODONE HCL 5 MG PO TABS
5.0000 mg | ORAL_TABLET | ORAL | Status: DC | PRN
  Administered 2024-09-17 – 2024-09-20 (×6): 5 mg
  Filled 2024-09-17 (×6): qty 1

## 2024-09-17 MED ORDER — HYDROMORPHONE HCL 1 MG/ML IJ SOLN
0.5000 mg | INTRAMUSCULAR | Status: DC | PRN
  Administered 2024-09-19: 0.5 mg via INTRAVENOUS
  Filled 2024-09-17: qty 0.5

## 2024-09-17 MED ORDER — HYDROCORTISONE SOD SUC (PF) 100 MG IJ SOLR
50.0000 mg | Freq: Two times a day (BID) | INTRAMUSCULAR | Status: DC
  Administered 2024-09-17 – 2024-09-18 (×4): 50 mg via INTRAVENOUS
  Filled 2024-09-17 (×4): qty 2

## 2024-09-17 MED ORDER — OXYCODONE HCL 5 MG PO TABS: 7.5000 mg | ORAL_TABLET | ORAL | Status: DC | PRN

## 2024-09-17 MED ORDER — LORAZEPAM 0.5 MG PO TABS: 0.5000 mg | ORAL_TABLET | ORAL | Status: DC | PRN

## 2024-09-17 MED ORDER — DEXTROSE 50 % IV SOLN
12.5000 g | Freq: Once | INTRAVENOUS | Status: AC
  Administered 2024-09-17: 12.5 g via INTRAVENOUS

## 2024-09-17 MED ORDER — LORAZEPAM 2 MG/ML IJ SOLN: 0.5000 mg | INTRAMUSCULAR | Status: DC | PRN

## 2024-09-17 NOTE — Progress Notes (Signed)
 Hypoglycemic Event  CBG: 61  Treatment: D50 25 mL (12.5 gm)  Symptoms: None  Follow-up CBG: Time:1257 CBG Result:139  Possible Reasons for Event: Unknown  Comments/MD notified: Attempted to give pt 4 oz of orange juice but pt declined and 12.5g of D50 was given. Will continue to monitor and encourage PO intake.  Leeanna FORBES Molt, BSN, RN

## 2024-09-17 NOTE — Progress Notes (Signed)
 Daily Progress Note   Date: 09/17/2024   Patient Name: [name]  DOB: 09-05-1981  MRN: [id number]  Age / Sex: 43 y.o., female  Attending Physician: Shelah Lamar RAMAN, MD Primary Care Physician: Lorren Greig PARAS, NP Admit Date: 08/31/2024 Length of Stay: 17 days  Reason for Follow-up: Establishing goals of care and Psychosocial/spiritual support   Subjective:   Subjective: Medical records reviewed including progress notes, labs and imaging. Patient assessed at the bedside. She reports pain and requests to sit up in bed. She also wants to remove her gown. Discussed with RN. Ok to give PRN Oxycodone . No family was present during my visit and patient was agreeable to a call to her mother to discuss her care plan and increasing need for pain medications, overall comfort care.  Called patient's mother but was unable to reach. Provided voicemail with PMT contact information. She then returned my call and I provider her with an update on last night's HD session and her difficulty today, barely tolerating HD. Provided update on patient's symptom burden today and shared my concern for patient's suffering.   I recommended liberalizing comfort medications despite hypotension, given high likelihood of poor outcome despite aggressive care. She agreed to give [place] whatever she needs to be comfortable. If patient survives until then, she does still want to see how the next dialysis session goes. She understands this could cause more harm than benefit. She will transition to full comfort when it becomes more clear that patient is unable to tolerate HD. Emotional support and therapeutic listening was provided.   Questions and concerns addressed. PMT will continue to support holistically.  Objective:   Vital Signs:  BP (!) 91/47   Pulse 89   Temp 97.6 F (36.4 C) (Axillary)   Resp (!) 22   Ht 5' 2 (1.575 m)   Wt 35.2 kg   LMP  (LMP Unknown) Comment: states over 2 years ago  SpO2 100%   BMI  14.19 kg/m   Physical Exam Vitals and nursing note reviewed.  Constitutional:      General: She is sleeping. She is not in acute distress.    Appearance: She is underweight. She is ill-appearing.     Comments: Minimally conversational, sleepy/lethargic  HENT:     Head: Normocephalic and atraumatic.  Cardiovascular:     Rate and Rhythm: Normal rate. Rhythm irregular.  Pulmonary:     Effort: Tachypnea present.  Skin:    General: Skin is warm and dry.  Neurological:     General: No focal deficit present.     Mental Status: She is easily aroused. She is lethargic.    Palliative Assessment/Data: 20-30% (back on tube feeds)   Existing Vynca/ACP Documentation: None  Assessment & Plan:   HPI/Patient Profile:  43 y.o. 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  She was admitted on 08/31/2024 with acute hypoxic respiratory failure, pulmonary edema, pneumonia, acute on chronic heart failure, mitral regurg, tricuspid regurg, aortic regurg, atrial fibrillation, ESRD, lupus nephritis, AGMA, hyperkalemia, severe protein calorie malnutrition, unintentional weight loss, physical deconditioning/generalized weakness, and others.    Palliative medicine was consulted for GOC conversations.  Assessment: Goals of care conversation Multiorgan failure due to complications of advanced lupus including ESRD, biventricular CHF Afib/aflutter Hypotension, chronic  Mitral regurg, Aortic regurg, Tricuspid regurg  Failure to thrive  SUMMARY OF RECOMMENDATIONS   Allow liberalization of comfort medications and more time for outcomes Patient's mother understands likely outcome is transition to full comfort measures, though she would like to try iHD again on 9/23 Increased  frequency of PRN Oxycodone  to Q4H PRN Ordered dilaudid  1mg  IV Q2H PRN for severe pain Increased frequency of PRN Ativan  PO to Q4H PRN Ordered Ativan  0.5 mg IV Q2H for anxiety not controlled by PO Psychosocial and emotional support provided Ongoing GOC discussions Palliative medicine will continue to follow   Prognosis: < 2 weeks  Discharge Planning: To Be Determined  Discussed with: Patient, patient's mother, RN, MD   Billing based on MDM: High  Lamont Tant Wonda RIGGERS Palliative Medicine Team Team phone # [phone]  Thank you for allowing the Palliative Medicine Team to assist in the care of this patient. Please utilize secure chat with additional questions, if there is no response within 30 minutes please call the above phone number.  Palliative Medicine Team providers are available by phone from 7am to 7pm daily and can be reached through the team cell phone.  Should this patient require assistance outside of these hours, please call the patient's attending physician.  Portions of this note are a verbal dictation therefore any spelling and/or grammatical errors are due to the Dragon [employer] One system interpretation.

## 2024-09-17 NOTE — Progress Notes (Signed)
 [facility] Progress Note   Subjective:    Did iHD yesterday --> BP's dropped into 70's early and UF was turned off; w/ midodrine  and IV alb support we were able to run w/ SBP > 85-90 and no UF. During the last 45- 60 min we pulled 500 cc UF w/o bp drop. Net UF was 0.5 L.  Wt's unchanged at 35.2kg today   Objective Vitals:   09/17/24 1000 09/17/24 1100 09/17/24 1131 09/17/24 1200  BP:    (!) 103/33  Pulse: (!) 110 90  92  Resp: (!) 28 17  17   Temp:   (!) 97 F (36.1 C)   TempSrc:   Axillary   SpO2: 95% 93%  96%  Weight:      Height:       Physical Exam Gen: cachectic, ill-appearing, on  [place] O2 2 L  Heart: RRR; No MRGs Lungs: normal WOB on HFNC Abdomen: Soft and non-tender Extremities: No LE edema Dialysis Access: AVF +t/b - weak but present   Dialysis Orders: TTS HD  3h  B400   37.5kg   2K bath  AVF  Heparin  none Post 9/02 wt 38.2kg, usual comes off 0-1.5kg over Hb 12, no esa   Home bp meds: Midodrine  10 tid  Assessment/Plan: SOB/ pulm edema/ O2 requirement/ chronic systolic HF/  RV dysfunction/ valve disease/ ESRD on HD/ volume: thin margin between hypo and hypervolemia in setting of advanced cardiac disease. PCWP 29 mmHg on RHC 9/16, w/ severe RHF and severe TV regurgitation. CXR w/ diffuse bilat IS edema on multiple CXR's. CHF team consulted and did RHC on 9/16 which showed sig vol overload. We were unable to get sufficient volume off w/ iHD, so the CCM team along w/ CHF recommended trial of CRRT. This was done 9/16- 9/18 and during this 2 day period we had a net negative UF of about 4 L, with a 4 kg drop in wts. Yesterday 9/20 we transitioned back to iHD; the UF was low at only 0.5 L due to BP drops, but she did tolerate the session for the most part, w/ midodrine  and IV alb support. Has not had much wt gain the last few days. Will have to drop the IV alb support at some point and just rely on midodrine . Plan next HD Tuesday.  ESRD: on HD TTS. HD as  above.  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 VVS assisted. Use an expert cannulator if possible.  Right heart failure/ severe TV regurg: not a candidate for surgical or other invasive therapies due to ongoing cocaine  and high risk  A fib/ flutter: on amio, no BB due to hypotension, no anticoag due to h/o subdural hematoma.  GOC: poor prognosis due to severe RHF, hypotension, debility DNR-Intervene   Myer Fret  MD  [facility] 09/17/2024, 12:24 PM  Recent Labs  Lab 09/13/24 1628 09/14/24 0413 09/15/24 0412 09/15/24 1635 09/16/24 0443 09/17/24 0433  HGB  --  10.4* 10.7*  --  10.4*  --   ALBUMIN  2.0* 2.2*  --   --   --   --   CALCIUM  9.3 9.3 9.6   < > 9.2 9.3  PHOS 3.0 2.3*  --   --  4.2 5.7*  CREATININE 1.48* 1.27* 2.14*   < > 3.16* 2.26*  K 4.5  4.4 5.0   < > 5.2* 4.3   < > = values in this interval not displayed.    Inpatient medications:  amiodarone   200 mg Per Tube BID   Chlorhexidine  Gluconate Cloth  6 each Topical Daily   Chlorhexidine  Gluconate Cloth  6 each Topical Q0600   feeding supplement (PROSource TF20)  60 mL Per Tube TID   folic acid   1 mg Per Tube Daily   heparin   5,000 Units Subcutaneous Q8H   hydrocortisone  sod succinate (SOLU-CORTEF ) inj  50 mg Intravenous Q12H   midodrine   15 mg Per Tube TID WC   modafinil   100 mg Per Tube Daily   multivitamin  1 tablet Per Tube QHS   mouth rinse  15 mL Mouth Rinse 4 times per day   sertraline   25 mg Per Tube Daily   thiamine  (VITAMIN B1) injection  100 mg Intravenous Q24H    anticoagulant sodium citrate      feeding supplement (VITAL AF 1.2 CAL) 30 mL/hr at 09/17/24 1100   acetaminophen , alteplase , anticoagulant sodium citrate , docusate sodium , feeding supplement (NEPRO CARB STEADY), Gerhardt's butt cream, heparin , heparin , lidocaine  (PF), lidocaine  (PF),  lidocaine -prilocaine , lidocaine -prilocaine , LORazepam , melatonin, midodrine , naLOXone  (NARCAN )  injection, mouth rinse, oxyCODONE  **OR** oxyCODONE , pentafluoroprop-tetrafluoroeth, pentafluoroprop-tetrafluoroeth, polyethylene glycol

## 2024-09-17 NOTE — Progress Notes (Signed)
 NAME:  [name], MRN:  [id number], DOB:  03/17/1981, LOS: 17 ADMISSION DATE:  08/31/2024, CONSULTATION DATE:  08/31/24 REFERRING MD: Dr. 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 Also had decreased appetite and fatigue. Denies fevers, chills. In ED, started on BiPAP. Able to transition to 6L [place]. CXR with pulmonary edema. K 5.3 BNP elevated. Normal WBC. LA 6.2>3.2. Given levaquin . SBP readings low-normal with intermittent readings with SBP in 60s and 70s but mentating well and eating a chicken wrap. PCCM and Nephrology consulted. Plan for hemodialysis in ICU.   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 HD 1.1L 9/5: HD removed 1 with levophed  9/6: HD removed 2L with levophed  9/7: Weaning pressors 9/8 moved out of ICU. Palliative care consulted. Also seen by vascular surg for bleeding of right AV fistula site. Suture placed. Pressure held. Advised against fistulogram, and advised HD cath if issues in future. On-going hypotension w/ HD noted.  9/9 -9/11 on-going supportive care on 9/12 poor po intake. No appetite, no endurance, not able to complete meal.  Started on dextrose  gtt. Called again for hypotension. Got 250 ML and placed supine BP improved.  9/12 transferred back to ICU for hypotension, hypoxia after fluid  bolus with very high BiPAP settings 9/13 iHD, able to come off bipap 9/16 right heart catheterization >> severe biventricular failure with elevated filling pressures, severe TR (PAP 40/30 (33), PA OP 29) 9/21 underwent intermittent HD with marginal blood pressures, ultimately 500 cc UF  Interim History / Subjective:  Underwent intermittent HD on 9/20 but with difficulty, periods of significant hypotension.  Ultimately was able to have UF 500 I/O+ 4.3 L total  SCr 3.16  > 2.26 CO2 14 > 14 K+ 4.3, Phos 5.7, Mg 2.5  Remains on room air Off pressors     Objective   Blood pressure (!) 91/47, pulse 89, temperature 97.6 F (36.4 C), temperature source Axillary, resp. rate (!) 22, height 5' 2 (1.575 m), weight 35.2 kg, SpO2 100%. CVP:  [9 mmHg-30 mmHg] 20 mmHg      Intake/Output Summary (Last 24 hours) at 09/17/2024 9072 Last data filed at 09/17/2024 0800 Gross per 24 hour  Intake 1267.5 ml  Output 0.5 ml  Net 1267 ml   Filed Weights   09/16/24 0454 09/16/24 1411 09/17/24 0437  Weight: 35.2 kg 38.4 kg 35.2 kg   Physical Exam: General: Frail cachectic woman laying in bed Neuro: Wakes, tracks, follows commands, severely globally weak.  One-word responses HEENT: Oropharynx clear, no stridor or secretions Cardiovascular: Distant, regular, no murmur, no edema Lungs: Moderate respiratory effort, few scattered crackles Abdomen: Nondistended, nontender, positive bowel sounds Musculoskeletal: No deformity Skin: No rash    Resolved Hospital Problem list   Acute on chronic resp failure AGMA Hyperkalemia  Hypocalcemia  Bleeding from AVF site suture placed   Assessment & Plan:   Multiorgan failure  due to complications of advanced lupus: ESRD, vasoplegia, cardioplegia. Biventricular failure. MR, AI.  -She transitioned off CVVH on 9/18, not a candidate to go back on.  She received intermittent HD 9/20 although not tolerated well.  Unclear that she will be able to maintain on iHD.   Need to continue discussions about possible transition to comfort care.  Patient and family are aware that there may not be any other options.  She is intermittently refusing care including her midodrine . - Plan to continue the midodrine  if she will take it - Remains off pressors.  Going by systolic pressure as there is wide pulse pressure on her A-line - Wean her hydrocortisone  to 50 mg every 12 hours.  Hypoxia: poor perfusion, hard to trust pleth. PaO2 on ABG 144, 77 -Not a candidate for intubation, poor candidate for BiPAP given her weakness, airway protection -Continue to optimize volume status, limited by her cardiorenal syndrome -Wean O2 as able  Severe cachexia, deconditioning -Continue tube feeding - Following for evidence of refeeding syndrome, replete electrolytes as needed - Thiamine  supplementation - Appreciate dietitian assistance  Atrial fib with some runs of RVR -Amiodarone  ordered, intermittently refusing p.o. meds - Off anticoagulation  Hypoglycemia: Have been chasing CBGs that remain chronically low: local milieu in fingerbeds suspicious for chronic catabolic state so question accuracy. -Now off D10, continue tube feeding - Follow CBG  Leukocytosis: -Afebrile, following -Remains on hydrocortisone , started 9/15, weaning on 9/21  History cocaine  dependence:   -Supportive care  Critical care time: 31 minutes   Lamar Chris, MD, PhD 09/17/2024, 9:27 AM [facility] [phone] or if no answer before 7:00PM call [phone] For any issues after 7:00PM please call eLink [phone]

## 2024-09-18 DIAGNOSIS — I5043 Acute on chronic combined systolic (congestive) and diastolic (congestive) heart failure: Secondary | ICD-10-CM | POA: Diagnosis not present

## 2024-09-18 DIAGNOSIS — J810 Acute pulmonary edema: Secondary | ICD-10-CM | POA: Diagnosis not present

## 2024-09-18 DIAGNOSIS — N186 End stage renal disease: Secondary | ICD-10-CM | POA: Diagnosis not present

## 2024-09-18 DIAGNOSIS — R570 Cardiogenic shock: Secondary | ICD-10-CM | POA: Diagnosis not present

## 2024-09-18 DIAGNOSIS — M3214 Glomerular disease in systemic lupus erythematosus: Secondary | ICD-10-CM | POA: Diagnosis not present

## 2024-09-18 DIAGNOSIS — J9601 Acute respiratory failure with hypoxia: Secondary | ICD-10-CM | POA: Diagnosis not present

## 2024-09-18 DIAGNOSIS — Z515 Encounter for palliative care: Secondary | ICD-10-CM | POA: Diagnosis not present

## 2024-09-18 LAB — POCT I-STAT 7, (LYTES, BLD GAS, ICA,H+H)
Acid-base deficit: 11 mmol/L — ABNORMAL HIGH (ref 0.0–2.0)
Bicarbonate: 13.9 mmol/L — ABNORMAL LOW (ref 20.0–28.0)
Calcium, Ion: 1.17 mmol/L (ref 1.15–1.40)
HCT: 41 % (ref 36.0–46.0)
Hemoglobin: 13.9 g/dL (ref 12.0–15.0)
O2 Saturation: 98 %
Patient temperature: 98
Potassium: 5 mmol/L (ref 3.5–5.1)
Sodium: 132 mmol/L — ABNORMAL LOW (ref 135–145)
TCO2: 15 mmol/L — ABNORMAL LOW (ref 22–32)
pCO2 arterial: 28.5 mmHg — ABNORMAL LOW (ref 32–48)
pH, Arterial: 7.295 — ABNORMAL LOW (ref 7.35–7.45)
pO2, Arterial: 108 mmHg (ref 83–108)

## 2024-09-18 LAB — BASIC METABOLIC PANEL WITH GFR
Anion gap: 20 — ABNORMAL HIGH (ref 5–15)
BUN: 133 mg/dL — ABNORMAL HIGH (ref 6–20)
CO2: 14 mmol/L — ABNORMAL LOW (ref 22–32)
Calcium: 9.1 mg/dL (ref 8.9–10.3)
Chloride: 96 mmol/L — ABNORMAL LOW (ref 98–111)
Creatinine, Ser: 3.36 mg/dL — ABNORMAL HIGH (ref 0.44–1.00)
GFR, Estimated: 17 mL/min — ABNORMAL LOW (ref >60)
Glucose, Bld: 107 mg/dL — ABNORMAL HIGH (ref 70–99)
Potassium: 5 mmol/L (ref 3.5–5.1)
Sodium: 130 mmol/L — ABNORMAL LOW (ref 135–145)

## 2024-09-18 LAB — MAGNESIUM: Magnesium: 2.9 mg/dL — ABNORMAL HIGH (ref 1.7–2.4)

## 2024-09-18 LAB — CBC
HCT: 34.4 % — ABNORMAL LOW (ref 36.0–46.0)
Hemoglobin: 11.1 g/dL — ABNORMAL LOW (ref 12.0–15.0)
MCH: 29.8 pg (ref 26.0–34.0)
MCHC: 32.3 g/dL (ref 30.0–36.0)
MCV: 92.5 fL (ref 80.0–100.0)
Platelets: 157 K/uL (ref 150–400)
RBC: 3.72 MIL/uL — ABNORMAL LOW (ref 3.87–5.11)
RDW: 25.7 % — ABNORMAL HIGH (ref 11.5–15.5)
WBC: 15.6 K/uL — ABNORMAL HIGH (ref 4.0–10.5)
nRBC: 130.6 % — ABNORMAL HIGH (ref 0.0–0.2)

## 2024-09-18 LAB — GLUCOSE, CAPILLARY
Glucose-Capillary: 131 mg/dL — ABNORMAL HIGH (ref 70–99)
Glucose-Capillary: 109 mg/dL — ABNORMAL HIGH (ref 70–99)
Glucose-Capillary: 116 mg/dL — ABNORMAL HIGH (ref 70–99)
Glucose-Capillary: 114 mg/dL — ABNORMAL HIGH (ref 70–99)
Glucose-Capillary: 155 mg/dL — ABNORMAL HIGH (ref 70–99)

## 2024-09-18 LAB — PHOSPHORUS: Phosphorus: 6.9 mg/dL — ABNORMAL HIGH (ref 2.5–4.6)

## 2024-09-18 NOTE — Progress Notes (Signed)
 eLink Physician-Brief Progress Note Patient Name: [name] DOB: 1981-08-11 MRN: [id number]   Date of Service  09/18/2024  HPI/Events of Note  Request for flexiseal.  Hx of end stage lupus nephritis, frail  eICU Interventions  Rectal tube ordered No contraindication identified     Intervention Category Intermediate Interventions: Other:  Damien ONEIDA Grout 09/18/2024, 9:01 PM

## 2024-09-18 NOTE — Progress Notes (Signed)
 eLink Physician-Brief Progress Note Patient Name: [name] DOB: 12-23-1981 MRN: [id number]   Date of Service  09/18/2024  HPI/Events of Note  Unable to get O2 sats ABG done 7.29/28/108/13.9 Ongoing GI loss  eICU Interventions  For dialysis in AM Discussed with BSRN will likely need bicarb replacement due to ongoing loss     Intervention Category Major Interventions: Acid-Base disturbance - evaluation and management Minor Interventions: Communication with other healthcare providers and/or family  Damien ONEIDA Grout 09/18/2024, 10:45 PM

## 2024-09-18 NOTE — Progress Notes (Signed)
 NAME:  TARA RUD, MRN:  [id number], DOB:  [birth year], LOS: 18 ADMISSION DATE:  08/31/2024, CONSULTATION DATE:  08/31/24 REFERRING MD: Dr. 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 Also had decreased appetite and fatigue. Denies fevers, chills. In ED, started on BiPAP. Able to transition to 6L [place]. CXR with pulmonary edema. K 5.3 BNP elevated. Normal WBC. LA 6.2>3.2. Given levaquin . SBP readings low-normal with intermittent readings with SBP in 60s and 70s but mentating well and eating a chicken wrap. PCCM and Nephrology consulted. Plan for hemodialysis in ICU.   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 HD 1.1L 9/5: HD removed 1 with levophed  9/6: HD removed 2L with levophed  9/7: Weaning pressors 9/8 moved out of ICU. Palliative care consulted. Also seen by vascular surg for bleeding of right AV fistula site. Suture placed. Pressure held. Advised against fistulogram, and advised HD cath if issues in future. On-going hypotension w/ HD noted.  9/9 -9/11 on-going supportive care on 9/12 poor po intake. No appetite, no endurance, not able to complete meal.  Started on dextrose  gtt. Called again for hypotension. Got 250 ML and placed supine BP improved.  9/12 transferred back to ICU for hypotension, hypoxia after fluid  bolus with very high BiPAP settings 9/13 iHD, able to come off bipap 9/16 right heart catheterization >> severe biventricular failure with elevated filling pressures, severe TR (PAP 40/30 (33), PA OP 29) 9/21 underwent intermittent HD with marginal blood pressures, ultimately 500 cc UF  Interim History / Subjective:  NAEO   Labs, VS, notes, imaging personally reviewed Case d/w nephro   Objective   Blood pressure (!) 91/47, pulse 92, temperature (!) 97.5 F (36.4 C), temperature source Axillary, resp. rate 10, height 5' 2 (1.575 m), weight 36.4 kg, SpO2 97%. CVP:  [17 mmHg-49 mmHg] 17 mmHg      Intake/Output Summary (Last 24 hours) at 09/18/2024 0858 Last data filed at 09/18/2024 0600 Gross per 24 hour  Intake 800.33 ml  Output --  Net 800.33 ml   Filed Weights   09/16/24 1411 09/17/24 0437 09/18/24 0500  Weight: 38.4 kg 35.2 kg 36.4 kg   Physical Exam: General: chronically and acutely ill middle aged F who appears older than stated age, cachectic.  Neuro: Lethargic, oriented x2, generalized weakness HEENT: NCAT pink mm  Cardiovascular: cap refill < 3 sec  Lungs: symmetrical chest expansion. Basilar crackles.  Abdomen:thin  Musculoskeletal: decr muscle mass and adipose deposits.  Skin: c/d   Resolved Hospital Problem list   Acute on chronic resp failure AGMA Hyperkalemia  Hypocalcemia  Bleeding from AVF site suture placed   Assessment & Plan:   Acute encephalopathy // ICU delirium, hypoactive  P -delirium precautions   Acute hypoxic resp failure Pulm edema  P -volume off via HD as able. Limited by pressures -pulm hygiene, limited by pt cooperation/encepahlopathy   ESRD  AGMA Hyponatremia Hyperphosphatemia Hypermagnesemia  Lupus nephritis  -completed a time trial of CRRT and needs to now tolerate HD  P -plan to try HD again 9/23 -will not be a candidate for subsequent CRRT runs   BiV HF MV regurg AI  Afib, intermittent rvr   -not on AC w hx head  bleed  P -volume off via HD above as able  AoC hypotension  Vasoplegia  P -encourage midodrine  adherence  -cont hydrocortisone  for now (50 q12 as of 9/21) and consider decr/dc pending HD attempt 9/23   FTT in adult Severe protein calorie malnutrition Physical deconditioning  P -EN via cortrak -PT/OT    Avascular necrosis  -no acute intervention   Cocaine  abuse -if she survives needs absolute abstinence   DNR, (pre-arrest interventions ok) GOC -palliative care is following  -sounds like there are discussions re transitioning to comfort if she is unable t   CCT n/a  High MDM   Ronnald Gave MSN, AGACNP-BC [facility] Pulmonary/Critical Care Medicine Amion for pager  09/18/2024, 8:58 AM

## 2024-09-18 NOTE — Progress Notes (Signed)
 Daily Progress Note   Date: 09/18/2024   Patient Name: [name]  DOB: May 28, 1981  MRN: [id number]  Age / Sex: 43 y.o., female  Attending Physician: Harold Scholz, MD Primary Care Physician: Lorren Greig PARAS, NP Admit Date: 08/31/2024 Length of Stay: 18 days  Reason for Follow-up: Establishing goals of care and Psychosocial/spiritual support   Subjective:   Subjective: Medical records reviewed including progress notes, labs and imaging. Patient assessed at the bedside. She states fine in response to follow up question on her pain after yesterday's adjustments. No family was present during my visit. Shared that PMT would continue to support her and her family with further conversations regarding next steps after tomorrow's dialysis attempt.  Questions and concerns addressed. PMT will continue to support holistically.  Objective:   Vital Signs:  BP (!) 91/47   Pulse 92   Temp (!) 97.5 F (36.4 C) (Axillary)   Resp 10   Ht 5' 2 (1.575 m)   Wt 36.4 kg   LMP  (LMP Unknown) Comment: states over 2 years ago  SpO2 97%   BMI 14.68 kg/m   Physical Exam Vitals and nursing note reviewed.  Constitutional:      General: She is sleeping. She is not in acute distress.    Appearance: She is underweight. She is ill-appearing.     Comments: Minimally conversational, sleepy/lethargic  HENT:     Head: Normocephalic and atraumatic.  Cardiovascular:     Rate and Rhythm: Normal rate. Rhythm irregular.  Pulmonary:     Effort: Tachypnea present.  Skin:    General: Skin is warm and dry.  Neurological:     General: No focal deficit present.     Mental Status: She is easily aroused. She is lethargic.    Palliative Assessment/Data: 20-30% (back on tube feeds)   Existing Vynca/ACP Documentation: None  Assessment & Plan:   HPI/Patient Profile:  43 y.o. 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  She was admitted on 08/31/2024 with acute hypoxic respiratory failure, pulmonary edema, pneumonia, acute on chronic heart failure, mitral regurg, tricuspid regurg, aortic regurg, atrial fibrillation, ESRD, lupus nephritis, AGMA, hyperkalemia, severe protein calorie malnutrition, unintentional weight loss, physical deconditioning/generalized weakness, and others.    Palliative medicine was consulted for GOC conversations.  Assessment: Goals of care conversation Multiorgan failure due to complications of advanced lupus including ESRD, biventricular CHF Afib/aflutter Hypotension, chronic Mitral regurg, Aortic regurg, Tricuspid regurg  Failure to thrive  SUMMARY OF RECOMMENDATIONS   Patient's mother understands likely outcome is transition to full comfort measures, though she would like to try iHD again on 9/23 Adequate pain control today with PRN medications Psychosocial and emotional support provided Ongoing GOC discussions Palliative medicine will continue to follow   Prognosis: < 2 weeks  Discharge Planning: To Be Determined  Discussed with: Patient   Bonnetta Allbee, PA-C Palliative Medicine Team Team phone # [phone]  Thank you for allowing the Palliative Medicine Team to assist in the care of this patient. Please utilize secure chat with additional questions, if there is no response within 30 minutes please call the above phone number.  Palliative Medicine Team providers are available by phone from 7am to 7pm daily and can be reached through the team cell phone.  Should this patient require assistance outside of these hours, please call the patient's attending physician.  Portions of this note are a verbal dictation therefore any spelling and/or grammatical errors are due to the Dragon Medical One system interpretation.     Time Total: 25  Visit consisted of counseling and education dealing with the complex and emotionally intense issues of symptom management and palliative care in the setting of serious and potentially life-threatening illness. Greater than 50% of this time was spent counseling and coordinating care related to the above assessment and plan.  Personally spent 25 minutes in patient care including extensive chart review (labs, imaging, progress/consult notes, vital signs), medically appropraite exam, discussed with treatment team, education to patient, family, and staff, documenting clinical information, medication review and management, coordination of care, and available advanced directive documents.

## 2024-09-18 NOTE — TOC Progression Note (Signed)
 Transition of Care [facility]) - Progression Note    Patient Details  Name: [name] MRN: [id number] Date of Birth: 1981-09-05  [facility] of [facility] CM/SW Contact  Isaiah Public, LCSWA Phone Number: 09/18/2024, 3:09 PM  Clinical Narrative:     TOC following.  Expected Discharge Plan: Home/Self Care Barriers to Discharge: Continued Medical Work up               Expected Discharge Plan and Services       Living arrangements for the past 2 months: Single Family Home                                       Social Drivers of Health (SDOH) Interventions SDOH Screenings   Food Insecurity: Patient Declined (09/04/2024)  Housing: Low Risk  (05/20/2024)  Transportation Needs: No Transportation Needs (05/20/2024)  Utilities: Not At Risk (05/20/2024)  Alcohol  Screen: Low Risk  (05/31/2024)  Depression (PHQ2-9): High Risk (05/31/2024)  Financial Resource Strain: Low Risk  (05/31/2024)  Physical Activity: Inactive (05/31/2024)  Social Connections: Socially Isolated (05/31/2024)  Stress: Stress Concern Present (05/31/2024)  Tobacco Use: High Risk (09/05/2024)  Health Literacy: Adequate Health Literacy (05/31/2024)    Readmission Risk Interventions    05/22/2024   11:49 AM 04/17/2022    8:36 AM  Readmission Risk Prevention Plan  Transportation Screening Complete Complete  PCP or Specialist Appt within 3-5 Days  Complete  HRI or Home Care Consult  Complete  Social Work Consult for Recovery Care Planning/Counseling  Complete  Palliative Care Screening  Not Applicable  Medication Review [employer]) Complete Complete  PCP or Specialist appointment within 3-5 days of discharge Complete   HRI or Home Care Consult Complete   Palliative Care Screening Not Applicable   Skilled Nursing Facility Not Applicable

## 2024-09-18 NOTE — Progress Notes (Signed)
 Physical Therapy Treatment Patient Details Name: [name] MRN: [id number] DOB: 11-07-1981 Today's Date: 09/18/2024   History of Present Illness 43 yo female admitted 08/31/24 for acute RF with hypoxia. 9/8 -9/12 progressive unit and on 9/12 ICU new bipap requirements and Hypotensive. 9/16 right heart catheterization. PMH ESRD on HD, heart murmur, lupus, polysubstance abuse, seizures, chronic R SDH,cocaine  dependence, protein malnutrition hypotensive.    PT Comments  Goals updated as appropriate. Pt required mod assist to stand and take a few lateral steps along the bed today. She was eager to sit up and stand. Fatigues easily but tolerated two bouts of standing up to 1 minute the first time, 30 second the second trial. UEs very weak, needs assist to grip RW to utilize. Reviewed LE exercises. Encouraged mobilizing often in bed and with nursing staff as tolerated. Patient will continue to benefit from skilled physical therapy services to further improve independence with functional mobility. Patient will benefit from continued inpatient follow up therapy, <3 hours/day.     If plan is discharge home, recommend the following: Assistance with cooking/housework;Help with stairs or ramp for entrance;Assist for transportation;A lot of help with walking and/or transfers;A lot of help with bathing/dressing/bathroom   Can travel by private vehicle     No  Equipment Recommendations  Wheelchair (measurements PT);Wheelchair cushion (measurements PT)    Recommendations for Other Services       Precautions / Restrictions Precautions Precautions: Fall Recall of Precautions/Restrictions: Intact Precaution/Restrictions Comments: watch sats and BP, R Femoral line, CRRT Restrictions Weight Bearing Restrictions Per Provider Order: No     Mobility  Bed Mobility Overal bed mobility: Needs Assistance Bed Mobility: Rolling, Sidelying to Sit, Sit to Sidelying Rolling: Min assist, Used rails Sidelying  to sit: Mod assist, HOB elevated     Sit to sidelying: Mod assist, Used rails General bed mobility comments: Needs some gentle tactile and verbal cues to facilitate roll and LEs off bed, mod assist for trunk to rise, and support for trunk and LEs back into bed. Utilized rail as able.    Transfers Overall transfer level: Needs assistance Equipment used: Rolling walker (2 wheels) Transfers: Sit to/from Stand Sit to Stand: Mod assist           General transfer comment: Mod assist for boost to stand x2. Weak UE strength, needs assist to transition hands onto RW in appropriate place to maximize support.    Ambulation/Gait Ambulation/Gait assistance: Mod assist Gait Distance (Feet): 2 Feet Assistive device: Rolling walker (2 wheels) Gait Pattern/deviations: Step-to pattern, Narrow base of support Gait velocity: decr Gait velocity interpretation: <1.31 ft/sec, indicative of household ambulator   General Gait Details: Mod assist for balance, to facilitate weight shift, and cues for sequencing with side steps along bed.   Stairs             Wheelchair Mobility     Tilt Bed    Modified Rankin (Stroke Patients Only)       Balance Overall balance assessment: Needs assistance Sitting-balance support: Single extremity supported, Feet supported Sitting balance-Leahy Scale: Poor Sitting balance - Comments: MIN-CGA at EOB   Standing balance support: Bilateral upper extremity supported, Reliant on assistive device for balance Standing balance-Leahy Scale: Poor Standing balance comment: UE support                            Communication Communication Communication: Impaired Factors Affecting Communication: Reduced clarity of speech  Cognition Arousal:  Lethargic Behavior During Therapy: Flat affect   PT - Cognitive impairments: Initiation, Sequencing, Problem solving, Attention, No family/caregiver present to determine baseline                        PT - Cognition Comments: difficult to hear Following commands: Impaired Following commands impaired: Follows one step commands inconsistently, Follows one step commands with increased time    Cueing Cueing Techniques: Verbal cues, Gestural cues, Tactile cues  Exercises General Exercises - Lower Extremity Ankle Circles/Pumps: AROM, Both, 10 reps, Supine Long Arc Quad: Strengthening, Both, 10 reps, Seated    General Comments General comments (skin integrity, edema, etc.): HR 98, BP 131/44      Pertinent Vitals/Pain Pain Assessment Pain Assessment: No/denies pain Pain Intervention(s): Monitored during session    Home Living                          Prior Function            PT Goals (current goals can now be found in the care plan section) Acute Rehab PT Goals Patient Stated Goal: get stronger PT Goal Formulation: With patient Time For Goal Achievement: 09/29/24 Potential to Achieve Goals: Fair Progress towards PT goals: Progressing toward goals    Frequency    Min 1X/week      PT Plan      Co-evaluation              AM-PAC PT 6 Clicks Mobility   Outcome Measure  Help needed turning from your back to your side while in a flat bed without using bedrails?: A Little Help needed moving from lying on your back to sitting on the side of a flat bed without using bedrails?: A Lot Help needed moving to and from a bed to a chair (including a wheelchair)?: A Lot Help needed standing up from a chair using your arms (e.g., wheelchair or bedside chair)?: A Lot Help needed to walk in hospital room?: Total Help needed climbing 3-5 steps with a railing? : Total 6 Click Score: 11    End of Session Equipment Utilized During Treatment: Gait belt Activity Tolerance: Patient tolerated treatment well;Patient limited by fatigue Patient left: in bed;with call bell/phone within reach;with bed alarm set Nurse Communication: Mobility status PT Visit Diagnosis:  Muscle weakness (generalized) (M62.81);Other abnormalities of gait and mobility (R26.89);Difficulty in walking, not elsewhere classified (R26.2)     Time: 1205-1226 PT Time Calculation (min) (ACUTE ONLY): 21 min  Charges:    $Therapeutic Activity: 8-22 mins PT General Charges $$ ACUTE PT VISIT: 1 Visit                     Leontine Roads, PT, DPT [facility]  Rehabilitation Services Physical Therapist Office: [phone] Website: [email or web address]    Leontine GORMAN Roads 09/18/2024, 2:56 PM

## 2024-09-18 NOTE — Progress Notes (Signed)
 Patient ID: [name], female   DOB: 08-16-1981, 43 y.o.   MRN: [id number] [facility] Progress Note   Assessment/ Plan:   1.  Acute hypoxic respiratory failure: Secondary to pulmonary edema/volume overload/CHF exacerbation and patient with end-stage renal disease on dialysis.  Right heart catheterization earlier indicated volume overload for which she was transiently on CRRT and transition over the weekend to hemodialysis which unfortunately was limited by intradialytic hypotension. 2. ESRD: She is usually on TTS hemodialysis schedule and I have ordered for hemodialysis to be undertaken tomorrow.  She remains hypotensive on midodrine  with significant azotemia and and continue with encephalopathy.  If she does not tolerate hemodialysis, we will need to involve palliative care to reassess goals of care and consider transitioning to comfort care measures/withdrawal of dialysis. 3. Anemia: Without loss and stable hemoglobin/hematocrit at this time.  Continue to monitor labs to decide on need to restart ESA. 4. CKD-MBD: Elevated phosphorus level with normal calcium  level currently at goal.  She remains encephalopathic without significant oral intake to safely consider initiation of binders. 5. Nutrition: Encephalopathic with poor intake, hyponatremia noted on labs likely related to ESRD/CHF and will be monitored with HD/UF. 6. Hypotension: Continue midodrine  3 times a day.  Suspected to have vasoplegia.  Subjective:   Without acute events overnight, remains encephalopathic.   Objective:   BP (!) 91/47   Pulse 92   Temp (!) 97.5 F (36.4 C) (Axillary)   Resp 10   Ht 5' 2 (1.575 m)   Wt 36.4 kg   LMP  (LMP Unknown) Comment: states over 2 years ago  SpO2 97%   BMI 14.68 kg/m   Physical Exam: Gen: Opens eyes to calling out her name but without verbal responses to questions CVS: Pulse regular rhythm, normal heart, S1 and S2 normal.  Left IJ temporary HD catheter Resp:  Anteriorly clear to auscultation, no rales/rhonchi.  On oxygen via [place] Abd: Soft, flat, nontender, bowel sounds normal Ext: No lower extremity edema.  Right upper arm AV fistula with intact dressing and suture  Labs: BMET Recent Labs  Lab 09/12/24 1649 09/13/24 0349 09/13/24 1628 09/14/24 0413 09/15/24 0412 09/15/24 1635 09/16/24 0443 09/17/24 0433 09/18/24 0444  NA 125* 129* 134* 133* 134* 132* 131* 130* 130*  K 4.7 4.4 4.5 4.4 5.0 4.9 5.2* 4.3 5.0  CL 92* 101 101 98 100 98 98 96* 96*  CO2 16* 18* 19* 20* 18* 17* 14* 14* 14*  GLUCOSE 101* 139* 140* 134* 153* 150* 153* 110* 107*  BUN 50* 39* 30* 32* 78* 113* 142* 81* 133*  CREATININE 3.77* 2.12* 1.48* 1.27* 2.14* 2.71* 3.16* 2.26* 3.36*  CALCIUM  8.9 8.9 9.3 9.3 9.6 9.4 9.2 9.3 9.1  PHOS 5.2* 3.8 3.0 2.3*  --   --  4.2 5.7* 6.9*   CBC Recent Labs  Lab 09/14/24 0413 09/15/24 0412 09/16/24 0443 09/18/24 0444  WBC 14.2* 15.1* 17.0* 15.6*  HGB 10.4* 10.7* 10.4* 11.1*  HCT 31.6* 32.4* 31.3* 34.4*  MCV 86.3 88.3 87.2 92.5  PLT 191 182 174 157      Medications:     amiodarone   200 mg Per Tube BID   Chlorhexidine  Gluconate Cloth  6 each Topical Daily   Chlorhexidine  Gluconate Cloth  6 each Topical Q0600   feeding supplement (PROSource TF20)  60 mL Per Tube TID   folic acid   1 mg Per Tube Daily   heparin   5,000 Units Subcutaneous Q8H   hydrocortisone  sod succinate (  SOLU-CORTEF ) inj  50 mg Intravenous Q12H   midodrine   15 mg Per Tube TID WC   modafinil   100 mg Per Tube Daily   multivitamin  1 tablet Per Tube QHS   mouth rinse  15 mL Mouth Rinse 4 times per day   sertraline   25 mg Per Tube Daily   thiamine  (VITAMIN B1) injection  100 mg Intravenous Q24H     Gordy Blanch, MD 09/18/2024, 7:58 AM

## 2024-09-19 DIAGNOSIS — J9601 Acute respiratory failure with hypoxia: Secondary | ICD-10-CM | POA: Diagnosis not present

## 2024-09-19 DIAGNOSIS — Z515 Encounter for palliative care: Secondary | ICD-10-CM | POA: Diagnosis not present

## 2024-09-19 DIAGNOSIS — J810 Acute pulmonary edema: Secondary | ICD-10-CM | POA: Diagnosis not present

## 2024-09-19 DIAGNOSIS — N186 End stage renal disease: Secondary | ICD-10-CM | POA: Diagnosis not present

## 2024-09-19 DIAGNOSIS — Z7189 Other specified counseling: Secondary | ICD-10-CM | POA: Diagnosis not present

## 2024-09-19 DIAGNOSIS — I5043 Acute on chronic combined systolic (congestive) and diastolic (congestive) heart failure: Secondary | ICD-10-CM | POA: Diagnosis not present

## 2024-09-19 DIAGNOSIS — Z66 Do not resuscitate: Secondary | ICD-10-CM | POA: Diagnosis not present

## 2024-09-19 LAB — CBC WITH DIFFERENTIAL/PLATELET
Abs Immature Granulocytes: 0.61 K/uL — ABNORMAL HIGH (ref 0.00–0.07)
Basophils Absolute: 0 K/uL (ref 0.0–0.1)
Basophils Relative: 0 %
Eosinophils Absolute: 0 K/uL (ref 0.0–0.5)
Eosinophils Relative: 0 %
HCT: 35.1 % — ABNORMAL LOW (ref 36.0–46.0)
Hemoglobin: 11.3 g/dL — ABNORMAL LOW (ref 12.0–15.0)
Immature Granulocytes: 3 %
Lymphocytes Relative: 0 %
Lymphs Abs: 0 K/uL — ABNORMAL LOW (ref 0.7–4.0)
MCH: 29.4 pg (ref 26.0–34.0)
MCHC: 32.2 g/dL (ref 30.0–36.0)
MCV: 91.4 fL (ref 80.0–100.0)
Monocytes Absolute: 1 K/uL (ref 0.1–1.0)
Monocytes Relative: 5 %
Neutro Abs: 17 K/uL — ABNORMAL HIGH (ref 1.7–7.7)
Neutrophils Relative %: 92 %
Platelets: 127 K/uL — ABNORMAL LOW (ref 150–400)
RBC: 3.84 MIL/uL — ABNORMAL LOW (ref 3.87–5.11)
RDW: 26.1 % — ABNORMAL HIGH (ref 11.5–15.5)
Smear Review: NORMAL
WBC: 18.6 K/uL — ABNORMAL HIGH (ref 4.0–10.5)
nRBC: 97.8 % — ABNORMAL HIGH (ref 0.0–0.2)

## 2024-09-19 LAB — BASIC METABOLIC PANEL WITH GFR
Anion gap: 20 — ABNORMAL HIGH (ref 5–15)
BUN: 186 mg/dL — ABNORMAL HIGH (ref 6–20)
CO2: 14 mmol/L — ABNORMAL LOW (ref 22–32)
Calcium: 8.9 mg/dL (ref 8.9–10.3)
Chloride: 97 mmol/L — ABNORMAL LOW (ref 98–111)
Creatinine, Ser: 4.35 mg/dL — ABNORMAL HIGH (ref 0.44–1.00)
GFR, Estimated: 13 mL/min — ABNORMAL LOW (ref >60)
Glucose, Bld: 136 mg/dL — ABNORMAL HIGH (ref 70–99)
Potassium: 5.4 mmol/L — ABNORMAL HIGH (ref 3.5–5.1)
Sodium: 131 mmol/L — ABNORMAL LOW (ref 135–145)

## 2024-09-19 LAB — GLUCOSE, CAPILLARY
Glucose-Capillary: 150 mg/dL — ABNORMAL HIGH (ref 70–99)
Glucose-Capillary: 158 mg/dL — ABNORMAL HIGH (ref 70–99)
Glucose-Capillary: 164 mg/dL — ABNORMAL HIGH (ref 70–99)
Glucose-Capillary: 170 mg/dL — ABNORMAL HIGH (ref 70–99)

## 2024-09-19 MED ORDER — THIAMINE MONONITRATE 100 MG PO TABS
100.0000 mg | ORAL_TABLET | Freq: Every day | ORAL | Status: DC
  Administered 2024-09-19 – 2024-09-20 (×2): 100 mg
  Filled 2024-09-19 (×2): qty 1

## 2024-09-19 MED ORDER — HEPARIN SODIUM (PORCINE) 1000 UNIT/ML IJ SOLN
INTRAMUSCULAR | Status: AC
  Filled 2024-09-19: qty 4

## 2024-09-19 NOTE — Progress Notes (Signed)
 Patient ID: [name], female   DOB: 01/04/1981, 43 y.o.   MRN: [id number] [facility] Progress Note   Assessment/ Plan:   1.  Acute hypoxic respiratory failure: Secondary to pulmonary edema/volume overload/CHF exacerbation and patient with end-stage renal disease on dialysis.  Right heart catheterization earlier indicated volume overload for which she was transiently on CRRT and she was transitioned back to hemodialysis which was limited by intradialytic hypotension. Remains on supplemental oxygen via [place]. 2. ESRD: She is usually on TTS hemodialysis schedule and will attempt HD again today.  Blood pressure better on midodrine  and likely to support HD+/- UF.  If she does not tolerate hemodialysis, we will need to reassess goals of care and consider transitioning to comfort care measures/withdrawal of dialysis. 3. Anemia: Without loss and stable hemoglobin/hematocrit at this time.  Continue to monitor labs to decide on need to restart ESA. 4. CKD-MBD: Elevated phosphorus level with normal calcium  level currently at goal.  She remains encephalopathic without significant oral intake to safely consider initiation of binders. 5. Nutrition: Encephalopathic with poor intake, hyponatremia noted on labs likely related to ESRD/CHF and will be monitored with HD/UF. 6. Hypotension: Continue midodrine  3 times a day.   Subjective:   Without acute events overnight, remains encephalopathic.   Objective:   BP (!) 91/47   Pulse 93   Temp (!) 97.5 F (36.4 C) (Axillary)   Resp 14   Ht 5' 2 (1.575 m)   Wt 36.4 kg   LMP  (LMP Unknown) Comment: states over 2 years ago  SpO2 97%   BMI 14.68 kg/m   Physical Exam: Gen: Appears chronically ill, some responses to questions CVS: Pulse regular rhythm, normal heart, S1 and S2 normal.  Left IJ temporary HD catheter Resp: Anteriorly clear to auscultation, no rales/rhonchi.  On oxygen via  Abd: Soft, flat, nontender, bowel sounds normal Ext: No  lower extremity edema.  Right upper arm AV fistula with some pulsatility  Labs: BMET Recent Labs  Lab 09/12/24 1649 09/13/24 0349 09/13/24 1628 09/14/24 0413 09/15/24 0412 09/15/24 1635 09/16/24 0443 09/17/24 0433 09/18/24 0444 09/18/24 2232 09/19/24 0526  NA 125* 129* 134* 133* 134* 132* 131* 130* 130* 132* 131*  K 4.7 4.4 4.5 4.4 5.0 4.9 5.2* 4.3 5.0 5.0 5.4*  CL 92* 101 101 98 100 98 98 96* 96*  --  97*  CO2 16* 18* 19* 20* 18* 17* 14* 14* 14*  --  14*  GLUCOSE 101* 139* 140* 134* 153* 150* 153* 110* 107*  --  136*  BUN 50* 39* 30* 32* 78* 113* 142* 81* 133*  --  186*  CREATININE 3.77* 2.12* 1.48* 1.27* 2.14* 2.71* 3.16* 2.26* 3.36*  --  4.35*  CALCIUM  8.9 8.9 9.3 9.3 9.6 9.4 9.2 9.3 9.1  --  8.9  PHOS 5.2* 3.8 3.0 2.3*  --   --  4.2 5.7* 6.9*  --   --    CBC Recent Labs  Lab 09/15/24 0412 09/16/24 0443 09/18/24 0444 09/18/24 2232 09/19/24 0526  WBC 15.1* 17.0* 15.6*  --  PENDING  NEUTROABS  --   --   --   --  PENDING  HGB 10.7* 10.4* 11.1* 13.9 11.3*  HCT 32.4* 31.3* 34.4* 41.0 35.1*  MCV 88.3 87.2 92.5  --  91.4  PLT 182 174 157  --  127*      Medications:     amiodarone   200 mg Per Tube BID   Chlorhexidine  Gluconate Cloth  6 each Topical Daily   Chlorhexidine  Gluconate Cloth  6 each Topical Q0600   feeding supplement (PROSource TF20)  60 mL Per Tube TID   folic acid   1 mg Per Tube Daily   heparin   5,000 Units Subcutaneous Q8H   hydrocortisone  sod succinate (SOLU-CORTEF ) inj  50 mg Intravenous Q12H   midodrine   15 mg Per Tube TID WC   modafinil   100 mg Per Tube Daily   multivitamin  1 tablet Per Tube QHS   mouth rinse  15 mL Mouth Rinse 4 times per day   sertraline   25 mg Per Tube Daily   thiamine  (VITAMIN B1) injection  100 mg Intravenous Q24H     Gordy Blanch, MD 09/19/2024, 8:27 AM

## 2024-09-19 NOTE — Progress Notes (Signed)
 Daily Progress Note   Date: 09/19/2024   Patient Name: [name]  DOB: 1981/02/22  MRN: [id number]  Age / Sex: 43 y.o., female  Attending Physician: Harold Scholz, MD Primary Care Physician: Lorren Greig PARAS, NP Admit Date: 08/31/2024 Length of Stay: 19 days  Reason for Follow-up: Establishing goals of care and Psychosocial/spiritual support  Past Medical History:  Diagnosis Date   Anemia    low iron  - receives iron  at dialysis   Anxiety    Arthritis    RA   Atrial fibrillation with RVR (HCC) 12/14/2023   Chronic systolic congestive heart failure (HCC) 03/16/2016   Dyspnea    ESRD (end stage renal disease) (HCC)    [facility] _ [place]   H/O pericarditis 01/17/2013   H/O pleural effusion 01/17/2013   Heart murmur    Lupus (systemic lupus erythematosus) (HCC)    Previously followed with Dr. 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 pneumonae bacteremia on 01/21/2013. Sensitive to Peniccilin     Subjective:   Subjective: Chart Reviewed. Updates received. Patient Assessed. Created space and opportunity for patient  and family to explore thoughts and feelings regarding current medical situation.  Today's Discussion: Today before meeting with the patient/family, I reviewed the chart notes including PCCM note from yesterday, nephrology note from yesterday, nephrology note from today, PCCM note from today.  Later in the day I reviewed another PCCM update as well as TOC note. Labs reviewed  include CBC which shows continued increase in white count from 15.6 yesterday to 18.6 today in the setting of respiratory failure and pneumonia.  BMP with hyponatremia at 131, hyperkalemia at 5.4, bump in creatinine to 4.35 today from 3.36 yesterday as well as BUN continuing to climb from a low of 81 two days ago to 133 yesterday to 186 this morning in the setting of ESRD.  She tolerated IHD a couple days ago and plan is to try to we dialyze her today for tolerance.  Prior to seeing the patient at the bedside I spoke with the dialysis nurse who notes that the patient went into A-fib with RVR heart rate in the 140s with ultrafiltration which has since been paused.  Plan to attempt reinitiation of ultrafiltration for tolerance.  I spoke with PCCM about the patient as well.  They feel that she is volume overloaded today and will likely tolerate dialysis today because of midodrine  and volume overload.  However, they continue to be highly concerned that she will cease tolerating hemodialysis in the very near future.  We again discussed the plan is to continue dialysis as long as tolerated but recommend transition to comfort when she no longer tolerates his at that point she will be out of options.  They are also hoping to correlate blood pressure cuff with arterial line, possibly DC art line and transfer out of the ICU.  Today I saw the patient at the bedside, she is resting currently but awakens easily to voice.  She denies nausea, vomiting.  She is quite sleepy and seems tired so I elected to let her sleep.  I provided emotional and general support through therapeutic listening, empathy, sharing of stories, , and other techniques. I answered all questions and addressed all concerns to the best of my ability.  Review of Systems  Constitutional:  Positive for fatigue.  Gastrointestinal:  Negative for abdominal pain, nausea and vomiting.    Objective:   Primary Diagnoses: Present on Admission:  Acute  respiratory failure with hypoxia (HCC)  Acute on chronic combined systolic and diastolic CHF (congestive heart failure) (HCC)  Atrial flutter (HCC)  Fluid overload  Hemodialysis catheter malfunction  Insomnia  Lupus (systemic lupus erythematosus) (HCC)   Vital Signs:  BP (!) 137/46   Pulse (!) 145   Temp (!) 97.5 F (36.4 C)   Resp (!) 22   Ht 5' 2 (1.575 m)   Wt 36.4 kg   LMP  (LMP Unknown) Comment: states over 2 years ago  SpO2 92%   BMI 14.68 kg/m   Physical Exam Vitals and nursing note reviewed.  Constitutional:      General: She is sleeping. She is not in acute distress.    Appearance: She is underweight. She is ill-appearing.     Comments: Minimally conversational, sleepy/lethargic  HENT:     Head: Normocephalic and atraumatic.  Cardiovascular:     Rate and Rhythm: Normal rate. Rhythm irregular.  Pulmonary:     Effort: Pulmonary effort is normal. No respiratory distress.  Abdominal:     General: Abdomen is flat.  Skin:    General: Skin is warm and dry.  Neurological:     General: No focal deficit present.     Mental Status: She is easily aroused.     Palliative Assessment/Data: 20-30%   Existing Vynca/ACP Documentation: None  Assessment & Plan:   HPI/Patient Profile:  43 y.o. 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  She was admitted on 08/31/2024 with acute hypoxic respiratory failure, pulmonary edema, pneumonia, acute on chronic heart failure, mitral regurg, tricuspid regurg, aortic regurg, atrial fibrillation, ESRD, lupus nephritis, AGMA, hyperkalemia, severe protein calorie malnutrition, unintentional weight loss, physical deconditioning/generalized weakness, and others.    Palliative medicine was consulted for GOC  conversations.  SUMMARY OF RECOMMENDATIONS   DNR-interventions desired Anticipate  iHD today, monitor for tolerance If she does not tolerate iHD will need to have discussions about transitioning to comfort care as she will be out of options at that point Palliative medicine will continue to follow  Symptom Management:  Per primary team Palliative medicine is available to assist as needed  Code Status: DNR - Prearrest Interventions Desired  Prognosis: < 2 weeks  Discharge Planning: To Be Determined  Discussed with: Patient, medical team, nursing  Thank you for allowing us  to participate in the care of SHARESE MANRIQUE PMT will continue to support holistically.  Billing based on MDM: Moderate  Detailed review of medical records (labs, imaging, vital signs), medically appropriate exam, discussed with treatment team, counseling and education to patient, family, & staff, documenting clinical information, medication management, coordination of care  [name], NP Palliative Medicine Team  Team Phone # [phone] (Nights/Weekends)  08/26/2021, 8:17 AM

## 2024-09-19 NOTE — Progress Notes (Signed)
 NAME:  [name], MRN:  [id number], DOB:  1981/07/21, LOS: 19 ADMISSION DATE:  08/31/2024, CONSULTATION DATE:  08/31/24 REFERRING MD: Dr. 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 Also had decreased appetite and fatigue. Denies fevers, chills. In ED, started on BiPAP. Able to transition to 6L [place]. CXR with pulmonary edema. K 5.3 BNP elevated. Normal WBC. LA 6.2>3.2. Given levaquin . SBP readings low-normal with intermittent readings with SBP in 60s and 70s but mentating well and eating a chicken wrap. PCCM and Nephrology consulted. Plan for hemodialysis in ICU.   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 HD 1.1L 9/5: HD removed 1 with levophed  9/6: HD removed 2L with levophed  9/7: Weaning pressors 9/8 moved out of ICU. Palliative care consulted. Also seen by vascular surg for bleeding of right AV fistula site. Suture placed. Pressure held. Advised against fistulogram, and advised HD cath if issues in future. On-going hypotension w/ HD noted.  9/9 -9/11 on-going supportive care on 9/12 poor po intake. No appetite, no endurance, not able to complete meal.  Started on dextrose  gtt. Called again for hypotension. Got 250 ML and placed supine BP improved.  9/12 transferred back to ICU for hypotension, hypoxia after fluid  bolus with very high BiPAP settings 9/13 iHD, able to come off bipap 9/16 right heart catheterization >> severe biventricular failure with elevated filling pressures, severe TR (PAP 40/30 (33), PA OP 29) 9/21 underwent intermittent HD with marginal blood pressures, ultimately 500 cc UF 9/23 dc cortef . iHD attempt   Interim History / Subjective:  NAEO   Labs, VS, notes, imaging personally reviewed Case d/w nephro   Objective   Blood pressure (!) 125/52, pulse 92, temperature (!) 97.5 F (36.4 C), resp. rate 16, height 5' 2 (1.575 m), weight 36.4 kg, SpO2 92%. CVP:  [19 mmHg-43 mmHg] 33 mmHg      Intake/Output Summary (Last 24 hours) at 09/19/2024 0911 Last data filed at 09/19/2024 0800 Gross per 24 hour  Intake 1077.33 ml  Output --  Net 1077.33 ml   Filed Weights   09/17/24 0437 09/18/24 0500 09/19/24 0854  Weight: 35.2 kg 36.4 kg 36.4 kg   Physical Exam: General: chronically and acutely ill F, appears significantly older than stated age  Neuro:  lethargic. Oriented to self. Mumbling speech.  HEENT: NCAT cortrak. Dry mm  Cardiovascular: cap refill < 3 sec  Lungs: symmetrical chest expansion. Diminished at bases  Abdomen: thin soft. + FMS   Musculoskeletal: symmetrically decr muscle mass and fat deposits. RUA AVF  Skin: clean   Resolved Hospital Problem list   Acute on chronic resp failure AGMA Hyperkalemia  Hypocalcemia  Bleeding from AVF site suture placed   Assessment & Plan:     Acute metabolic encephalopathy // possible hypoactive ICU delirium  P -delirium precautions  -HD 9/23   Acute hypoxic resp failure Pulmonary edema  P -[place] for SpO2 > 92 -pulm hygiene -volume off via HD 9/23  ESRD Uremia Lupus nephritis  AGMA Hyperkalemia HypoNa HyperPhos HyperMag -completed a time trial of CRRT and needs to now tolerate HD  P -HD 9/23. She might tolerate this ok today -- but I worry this may be somewhat artificial given her dramatic hypervolemia, stress  dose steroids etc.   -will not be a candidate for subsequent CRRT runs -palliative care is following   -If she is able to tolerate iHD 9/23, we need to get NIBP pressures at some point. Is not feasible to expect iHD based on arterial line pressures indefinitely   BiV HF MV regurg AI  Afib, intermittent rvr   -not on AC w hx head bleed  P -Volume off via HD  -no AC 2/2 intracranial bleed in the past   AoC hypotension  Vasoplegia  P -encourage midodrine  adherence  -dc cortef  9/23  Anemia chronic dz Thrombocytopenia P -follow CBC   FTT in adult Severe protein calorie malnutrition Physical deconditioning  P -EN via cortrak -PT/OT    Avascular necrosis  -no acute intervention   Cocaine  abuse -if she survives needs absolute abstinence   DNR, (pre-arrest interventions ok) GOC -palliative care is following  -sounds like there are discussions re transitioning to comfort if she is unable to tolerate HD      CCT n/a    Ronnald Gave MSN, AGACNP-BC [place] [facility] Amion for pager  09/19/2024, 9:11 AM

## 2024-09-19 NOTE — Progress Notes (Signed)
   09/19/24 1315  Vitals  Temp (!) 97.4 F (36.3 C)  Pulse Rate (!) 102  Resp 11  BP (!) 130/57  SpO2 99 %  O2 Device Nasal Cannula  Weight 34.4 kg  Type of Weight Post-Dialysis  Oxygen Therapy  O2 Flow Rate (L/min) 6 L/min  Patient Activity (if Appropriate) In bed  Pulse Oximetry Type Continuous  Oximetry Probe Site Changed No  Post Treatment  Dialyzer Clearance Lightly streaked  Hemodialysis Intake (mL) 0 mL  Liters Processed 73.3  Fluid Removed (mL) 2000 mL  Tolerated HD Treatment Yes   Pt tx completed at bedside---2H04  No change in baseline neuro status Informed consent signed and in chart.   TX duration:3.5  Patient tolerated well.   No acute distress  Hand-off given to patient's nurse.   Access used: [facility] TLC--temp Access issues: no complications  Total UF removed: 2000 Medication(s) given: mididrine 5mg  po x 1 PRN [facility]

## 2024-09-19 NOTE — Progress Notes (Addendum)
 NIBP and arterial line correlate well.  She got both [facility] midodrine  15 and PRN midodrine  10 this morning prior to HD , as well as midodrine  5 during HD   She is normotensive and is has completed HD.   Went into Afib during HD w intermittent RVR, but ultimately they were able to get 2L off.  Now fib, rate controlled.   P  Plan to remove arterial line and transfer out of ICU   Will ask TRH to take over care starting 9/24   Palliative care to continue following     Ronnald Gave MSN, AGACNP-BC [place] [facility] Amion for pager 09/19/2024, 1:25 PM

## 2024-09-19 NOTE — TOC Progression Note (Signed)
 Transition of Care [facility]) - Progression Note    Patient Details  Name: [name] MRN: [id number] Date of Birth: 10/14/1981  [facility] CM/SW Contact  Isaiah Public, LCSWA Phone Number: 09/19/2024, 3:17 PM  Clinical Narrative:     TOC following.  Expected Discharge Plan: Home/Self Care Barriers to Discharge: Continued Medical Work up               Expected Discharge Plan and Services       Living arrangements for the past 2 months: Single Family Home                                       Social Drivers of Health (SDOH) Interventions SDOH Screenings   Food Insecurity: Patient Declined (09/04/2024)  Housing: Low Risk  (05/20/2024)  Transportation Needs: No Transportation Needs (05/20/2024)  Utilities: Not At Risk (05/20/2024)  Alcohol  Screen: Low Risk  (05/31/2024)  Depression (PHQ2-9): High Risk (05/31/2024)  Financial Resource Strain: Low Risk  (05/31/2024)  Physical Activity: Inactive (05/31/2024)  Social Connections: Socially Isolated (05/31/2024)  Stress: Stress Concern Present (05/31/2024)  Tobacco Use: High Risk (09/05/2024)  Health Literacy: Adequate Health Literacy (05/31/2024)    Readmission Risk Interventions    05/22/2024   11:49 AM 04/17/2022    8:36 AM  Readmission Risk Prevention Plan  Transportation Screening Complete Complete  PCP or Specialist Appt within 3-5 Days  Complete  HRI or Home Care Consult  Complete  Social Work Consult for Recovery Care Planning/Counseling  Complete  Palliative Care Screening  Not Applicable  Medication Review [employer]) Complete Complete  PCP or Specialist appointment within 3-5 days of discharge Complete   HRI or Home Care Consult Complete   Palliative Care Screening Not Applicable   Skilled Nursing Facility Not Applicable

## 2024-09-20 DIAGNOSIS — Z515 Encounter for palliative care: Secondary | ICD-10-CM | POA: Diagnosis not present

## 2024-09-20 DIAGNOSIS — Z7189 Other specified counseling: Secondary | ICD-10-CM | POA: Diagnosis not present

## 2024-09-20 DIAGNOSIS — Z66 Do not resuscitate: Secondary | ICD-10-CM | POA: Diagnosis not present

## 2024-09-20 DIAGNOSIS — J810 Acute pulmonary edema: Secondary | ICD-10-CM | POA: Diagnosis not present

## 2024-09-20 DIAGNOSIS — I483 Typical atrial flutter: Secondary | ICD-10-CM

## 2024-09-20 DIAGNOSIS — J9601 Acute respiratory failure with hypoxia: Secondary | ICD-10-CM | POA: Diagnosis not present

## 2024-09-20 DIAGNOSIS — Z789 Other specified health status: Secondary | ICD-10-CM | POA: Diagnosis not present

## 2024-09-20 DIAGNOSIS — I5043 Acute on chronic combined systolic (congestive) and diastolic (congestive) heart failure: Secondary | ICD-10-CM | POA: Diagnosis not present

## 2024-09-20 LAB — GLUCOSE, CAPILLARY
Glucose-Capillary: 149 mg/dL — ABNORMAL HIGH (ref 70–99)
Glucose-Capillary: 152 mg/dL — ABNORMAL HIGH (ref 70–99)
Glucose-Capillary: 170 mg/dL — ABNORMAL HIGH (ref 70–99)

## 2024-09-20 MED ORDER — HALOPERIDOL LACTATE 2 MG/ML PO CONC: 0.5000 mg | ORAL | Status: DC | PRN

## 2024-09-20 MED ORDER — HALOPERIDOL 0.5 MG PO TABS: 0.5000 mg | ORAL_TABLET | ORAL | Status: DC | PRN

## 2024-09-20 MED ORDER — ONDANSETRON 4 MG PO TBDP: 4.0000 mg | ORAL_TABLET | Freq: Four times a day (QID) | ORAL | Status: DC | PRN

## 2024-09-20 MED ORDER — HYDROMORPHONE HCL 1 MG/ML IJ SOLN
0.5000 mg | Freq: Two times a day (BID) | INTRAMUSCULAR | Status: DC | PRN
  Administered 2024-09-20: 1 mg via INTRAVENOUS
  Filled 2024-09-20: qty 1

## 2024-09-20 MED ORDER — HYDROMORPHONE HCL 1 MG/ML IJ SOLN
0.5000 mg | INTRAMUSCULAR | Status: DC | PRN
  Administered 2024-09-20 (×2): 2 mg via INTRAVENOUS
  Administered 2024-09-21: 1 mg via INTRAVENOUS
  Administered 2024-09-21 (×2): 2 mg via INTRAVENOUS
  Filled 2024-09-20 (×2): qty 2
  Filled 2024-09-20: qty 1
  Filled 2024-09-20 (×2): qty 2

## 2024-09-20 MED ORDER — POLYVINYL ALCOHOL 1.4 % OP SOLN: 1.0000 [drp] | Freq: Four times a day (QID) | OPHTHALMIC | Status: DC | PRN

## 2024-09-20 MED ORDER — LOPERAMIDE HCL 2 MG PO CAPS
2.0000 mg | ORAL_CAPSULE | ORAL | Status: DC | PRN
  Administered 2024-09-20: 2 mg via ORAL
  Filled 2024-09-20: qty 1

## 2024-09-20 MED ORDER — GLYCOPYRROLATE 0.2 MG/ML IJ SOLN
0.2000 mg | INTRAMUSCULAR | Status: DC | PRN
  Administered 2024-09-21: 0.2 mg via INTRAVENOUS
  Filled 2024-09-20: qty 1

## 2024-09-20 MED ORDER — GLYCOPYRROLATE 1 MG PO TABS: 1.0000 mg | ORAL_TABLET | ORAL | Status: DC | PRN

## 2024-09-20 MED ORDER — ONDANSETRON HCL 4 MG/2ML IJ SOLN: 4.0000 mg | Freq: Four times a day (QID) | INTRAMUSCULAR | Status: DC | PRN

## 2024-09-20 MED ORDER — BIOTENE DRY MOUTH MT LIQD: 15.0000 mL | OROMUCOSAL | Status: DC | PRN

## 2024-09-20 MED ORDER — GLYCOPYRROLATE 0.2 MG/ML IJ SOLN: 0.2000 mg | INTRAMUSCULAR | Status: DC | PRN

## 2024-09-20 MED ORDER — HALOPERIDOL LACTATE 5 MG/ML IJ SOLN: 0.5000 mg | INTRAMUSCULAR | Status: DC | PRN

## 2024-09-20 NOTE — Progress Notes (Signed)
 Daily Progress Note   Date: 09/20/2024   Patient Name: [name]  DOB: [birth year]  MRN: [id number]  Age / Sex: 43 y.o., female  Attending Physician: Raenelle Donalda HERO, MD Primary Care Physician: Lorren Greig PARAS, NP Admit Date: 08/31/2024 Length of Stay: 20 days  Reason for Follow-up: Establishing goals of care and Psychosocial/spiritual support  Past Medical History:  Diagnosis Date   Anemia    low iron  - receives iron  at dialysis   Anxiety    Arthritis    RA   Atrial fibrillation with RVR (HCC) 12/14/2023   Chronic systolic congestive heart failure (HCC) 03/16/2016   Dyspnea    ESRD (end stage renal disease) (HCC)    [facility] _ [place]   H/O pericarditis 01/17/2013   H/O pleural effusion 01/17/2013   Heart murmur    Lupus (systemic lupus erythematosus) (HCC)    Previously followed with Dr. 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 pneumonae bacteremia on 01/21/2013. Sensitive to Peniccilin     Subjective:   Subjective: Chart Reviewed. Updates received. Patient Assessed. Created space and opportunity for patient  and family to explore thoughts and feelings regarding current medical situation.  Today's Discussion: Today before meeting with the patient/family, I reviewed the chart notes including PCCM note from yesterday, nursing note from yesterday, TOC note from yesterday, nephrology note from today, OT note from today, hospitalist note from today.  No significant labs today other  than capillary blood glucose which is stable.  Prior to seeing the patient at the bedside I spoke with the nephrology PA, nephrology physician, hospitalist.  Per these individuals the patient seems to be more somnolent today, and is expressing wishes to no longer undergo HD.  Nephrology overall feels that HD is no longer benefiting this patient and they do not recommend continuing.  I shared that I would come check on the patient and have discussions with patient and family.  Since  Today I saw the patient at the bedside, she is resting currently but awakens easily to voice.  We spent time talking about how sick she is, the struggles that she has had over the past 3 weeks of admission.  She admits discomfort, specifically complaining about the rectal tube.  I shared that her kidney doctors do not feel dialysis is helping anymore.  I asked her if she wants to continue getting dialysis and she says no.  I shared that this would mean that she would pass away in the coming days and she verbalized understanding.  I asked her if she wants to transition to comfort care where we would keep her comfortable as her end-of-life approaches and she said yes.  The patient's cousin was present for this conversation as well.  I shared with the patient's cousin that I would call her mother to have further discussions and we will work on the logistics around comfort care.  Shortly after my visit the patient's cousin found in the hallway and told  me that multiple family members were en route to meet.  When family arrived, and included the patient's mother, father, sister, brother, niece.  I spent time introducing palliative medicine and our role in the health care team for those who would not met me before.  I explained the severity of her illness, which they all seem to have a very good understanding of.  I shared that hemodialysis is no longer effective and they are recommending stopping hemodialysis.  We spent time talking  about approaching end-of-life, regardless of what we do.  I shared my conversations with Thedora today where she expressed desire to stop dialysis and go home.  Family is understandably upset but seems to understand the situation.  We discussed options moving forward including transition to comfort care. I explained comfort care as care where the patient would no longer receive aggressive medical interventions such as continuous vital signs, lab work, radiology testing, or medications not focused on comfort, peace, and dignity. This includes stopping antibiotics and weaning oxygen to room air, as these are generally not accepted as providing comfort but only prolonging the dying process artificially. All care would focus on how the patient is looking and feeling. This would include management of any symptoms that may cause discomfort, pain, shortness of breath/air hunger, increased work of breathing, cough, nausea, agitation/restlessness, anxiety, and/or secretions etc. Symptoms would be managed with medications and other non-pharmacological interventions such as spiritual support if requested, repositioning, music therapy, or therapeutic listening. Family verbalized understanding and agreement.   We explained option for hospice care as well. I described hospice as a service for patients who have a life expectancy of 6 months or less. The goal of hospice is the preservation of dignity and quality at the end phases of life. Under hospice care, the focus changes from curative to symptom relief. I explained the three setting where hospice services can be provided including the home, at a living facility (such as LTC SNF, Assisted Living, etc), and a hospice facility. I explained that acceptance to hospice in any specific location is the final decision of the hospice medical director and bed availability, if applicable. They verbalized understanding.  After discussion family feels that home hospice is not a  feasible option due to the emotional and mental toll on the patient's mother as well as the physical toll and providing care in the home.  We discussed the option for inpatient hospice.  I offered choice and discussed local available hospice agencies and facilities.  They requested evaluation by [facility] for inpatient hospice placement for end-of-life.  I shared with the family that I would make a transition to comfort care, alter orders, remove unnecessary medications and invasive procedures including lab work and then add multiple medications to be available for any symptom management needs.  I shared that I would reach out to the social worker, debrief with the medical team and nursing teams, and reach out to the hospice liaison to start process of evaluation for inpatient hospice.  They verbalized understanding.  At the end of my visit the patient's brother left the group in a prayer.  I shared that I would check on the patient daily while she is comfort care to ensure that she has everything she needs.  I provided multiple contact cards with our team phone number and encouraged them to call for any questions or concerns.  I provided emotional and general support through therapeutic listening, empathy, sharing of stories, , and other techniques.  I answered all questions and addressed all concerns to the best of my ability.  Review of Systems  Constitutional:  Positive for fatigue.  Gastrointestinal:  Negative for abdominal pain, nausea and vomiting.       Admits rectal pain associated with rectal tube    Objective:   Primary Diagnoses: Present on Admission:  Acute respiratory failure with hypoxia (HCC)  Acute on chronic combined systolic and diastolic CHF (congestive heart failure) (HCC)  Atrial flutter (HCC)  Fluid overload  Hemodialysis catheter malfunction  Insomnia  Lupus (systemic lupus erythematosus) (HCC)   Vital Signs:  BP (!) 98/43 (BP Location:  Left Arm)   Pulse (!) 105   Temp 97.7 F (36.5 C) (Axillary)   Resp (!) 24   Ht 5' 2 (1.575 m)   Wt 36.7 kg   LMP  (LMP Unknown) Comment: states over 2 years ago  SpO2 98%   BMI 14.80 kg/m   Physical Exam Vitals and nursing note reviewed.  Constitutional:      General: She is sleeping. She is not in acute distress.    Appearance: She is underweight. She is ill-appearing.     Comments: More conversational, but remains sleepy/lethargic; appears very frail and weak  HENT:     Head: Normocephalic and atraumatic.  Cardiovascular:     Rate and Rhythm: Normal rate. Rhythm irregular.  Pulmonary:     Effort: Pulmonary effort is normal. No respiratory distress.  Abdominal:     General: Abdomen is flat.  Skin:    General: Skin is warm and dry.  Neurological:     General: No focal deficit present.     Mental Status: She is easily aroused.     Palliative Assessment/Data: 10-20%   Existing Vynca/ACP Documentation: None  Advanced Care Planning:   Pertinent diagnosis: End-stage renal disease, hypotension, respiratory failure, pneumonia, lupus, lupus nephritis  The patient and/or family consented to a voluntary Advance Care Planning Conversation in person. Individuals present for the conversation: The patient and multiple family members: The patient's mother, father, sister, brother, niece, [name], NP.  Summary of the conversation: We discussed the severity of her illness, that we are at the end of her illness with no further options, recommendation to stop dialysis by nephrology and the medical team and general, options for care moving forward including transition to comfort care and various hospice options.  Outcome of the conversations and/or documents completed: DNR-comfort, transition to comfort care inpatient, request evaluation for inpatient hospice at [facility]  I spent 30 minutes providing separately identifiable ACP services with the patient and/or  surrogate decision maker in a voluntary, in-person conversation discussing the patient's wishes and goals as detailed in the above note.  Assessment & Plan:   HPI/Patient Profile:  43 y.o. 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  She was admitted on 08/31/2024 with acute hypoxic respiratory failure, pulmonary edema, pneumonia, acute on chronic heart failure, mitral regurg, tricuspid regurg, aortic regurg, atrial fibrillation, ESRD, lupus nephritis, AGMA, hyperkalemia, severe protein calorie malnutrition, unintentional weight loss, physical deconditioning/generalized weakness, and others.    Palliative medicine was consulted for GOC conversations.  SUMMARY OF RECOMMENDATIONS   Changed to DNR-comfort Transition to comfort care See symptom management orders below [facility] consult for referral to inpatient  hospice at [facility] Will discontinue rectal tube Ongoing support patient and family Palliative medicine will continue to follow  Symptom Management:  Tylenol  650 mg PR every 6 hours as needed mild pain or fever Artificial tears ophthalmic 1 drop OU 4 times daily as needed dry eyes Robinul  0.2 mg IV every 4 hours as needed excessive secretions Haldol  0.5 mg IV every 4 hours as needed agitation or delirium Dilaudid  0.5 to 2 mg IV every 2 hours as needed severe pain (7-10), dyspnea, tachypnea (RR greater than 22), increased WOB Imodium  2 mg p.o. as needed diarrhea or loose stools Ativan  0.5 mg IV every 4 hours as needed anxiety Zofran  IV 4 mg every 6 hours as needed nausea or vomiting  Code Status: DNR - Comfort  Prognosis: Hours - Days  Discharge Planning: Hospice facility  Discussed with: Patient, patient's family, medical team,  nursing team, [facility] team, hospice liaison  Thank you for allowing us  to participate in the care of MACYN SHROPSHIRE PMT will continue to support holistically.  Billing based on MDM: High  Problems Addressed: One or more chronic illnesses with severe exacerbation, progression, or side effects of treatment.  Risks: Parenteral controlled substances and Decision not to resuscitate or to de-escalate care because of poor prognosis  Detailed review of medical records (labs, imaging, vital signs), medically appropriate exam, discussed with treatment team, counseling and education to patient, family, & staff, documenting clinical information, medication management, coordination of care  [name], NP Palliative Medicine Team  Team Phone # [phone] (Nights/Weekends)  08/26/2021, 8:17 AM

## 2024-09-20 NOTE — Progress Notes (Signed)
 Patient ID: [name], female   DOB: [birth year], 43 y.o.   MRN: [id number] [facility] Progress Note   Assessment/ Plan:   1.  Acute hypoxic respiratory failure: Secondary to pulmonary edema/volume overload/CHF exacerbation and patient with end-stage renal disease on dialysis.  Right heart catheterization earlier indicated volume overload for which she was transiently on CRRT and she was transitioned back to hemodialysis which was limited by intradialytic hypotension. Remains on supplemental oxygen via [place]. 2. ESRD: She is usually on TTS hemodialysis schedule. She did receive hemodialysis on Tuesday. On midodrine  for blood pressure support. Technically did tolerate dialysis yesterday but I do not think further dialysis will add any quality of life.   I think we should  reassess goals of care and strongly consider transitioning to comfort care measures/withdrawal of dialysis. 3. Anemia: Without loss and stable hemoglobin/hematocrit at this time.  Continue to monitor labs to decide on need to restart ESA. 4. CKD-MBD: Elevated phosphorus level with normal calcium  level currently at goal.  She remains encephalopathic without significant oral intake to safely consider initiation of binders. 5. Nutrition: Encephalopathic with poor intake, hyponatremia noted on labs likely related to ESRD/CHF and will be monitored with HD/UF. 6. Hypotension: Continue midodrine  3 times a day.   Subjective:   Transferred out of ICU. Did completed HD yesterday with UF. Blood pressure 80s this am. All she tells me is that she wants to go home. I told her I don't think her body can tolerate more dialysis. She appears to agree.    Objective:   BP (!) 97/43 (BP Location: Left Arm)   Pulse (!) 101   Temp (!) 97.5 F (36.4 C) (Oral)   Resp (!) 28   Ht 5' 2 (1.575 m)   Wt 36.7 kg   LMP  (LMP Unknown) Comment: states over 2 years ago  SpO2 98%   BMI 14.80 kg/m   Physical Exam: Gen: Appears chronically  ill, some responses to questions CVS: Pulse regular rhythm, normal heart, S1 and S2 normal.  Left IJ temporary HD catheter Resp: Anteriorly clear to auscultation, no rales/rhonchi.  On oxygen via [place] Abd: Soft, flat, nontender, bowel sounds normal Ext: No lower extremity edema.  Right upper arm AV fistula with some pulsatility  Labs: BMET Recent Labs  Lab 09/13/24 1628 09/14/24 0413 09/15/24 [id number] 09/15/24 1635 09/16/24 0443 09/17/24 0433 09/18/24 0444 09/18/24 2232 09/19/24 0526  NA 134* 133* 134* 132* 131* 130* 130* 132* 131*  K 4.5 4.4 5.0 4.9 5.2* 4.3 5.0 5.0 5.4*  CL 101 98 100 98 98 96* 96*  --  97*  CO2 19* 20* 18* 17* 14* 14* 14*  --  14*  GLUCOSE 140* 134* 153* 150* 153* 110* 107*  --  136*  BUN 30* 32* 78* 113* 142* 81* 133*  --  186*  CREATININE 1.48* 1.27* 2.14* 2.71* 3.16* 2.26* 3.36*  --  4.35*  CALCIUM  9.3 9.3 9.6 9.4 9.2 9.3 9.1  --  8.9  PHOS 3.0 2.3*  --   --  4.2 5.7* 6.9*  --   --    CBC Recent Labs  Lab 09/15/24 0412 09/16/24 0443 09/18/24 0444 09/18/24 2232 09/19/24 0526  WBC 15.1* 17.0* 15.6*  --  18.6*  NEUTROABS  --   --   --   --  17.0*  HGB 10.7* 10.4* 11.1* 13.9 11.3*  HCT 32.4* 31.3* 34.4* 41.0 35.1*  MCV 88.3 87.2 92.5  --  91.4  PLT  182 174 157  --  127*      Medications:     amiodarone   200 mg Per Tube BID   Chlorhexidine  Gluconate Cloth  6 each Topical Daily   Chlorhexidine  Gluconate Cloth  6 each Topical Q0600   feeding supplement (PROSource TF20)  60 mL Per Tube TID   folic acid   1 mg Per Tube Daily   heparin   5,000 Units Subcutaneous Q8H   midodrine   15 mg Per Tube TID WC   modafinil   100 mg Per Tube Daily   multivitamin  1 tablet Per Tube QHS   mouth rinse  15 mL Mouth Rinse 4 times per day   sertraline   25 mg Per Tube Daily   thiamine   100 mg Per Tube Daily    Elveta Rape Ronnald Acosta PA-C [facility] 09/20/2024,10:58 AM

## 2024-09-20 NOTE — Progress Notes (Signed)
 OT Sign off Note  Patient Details Name: [name] MRN: [id number] DOB: 08-Sep-1981   Cancelled Treatment:    Reason Eval/Treat Not Completed: Other (comment) (pt wishing to go comfort. OT to sign off acutely)  Ely Molt 09/20/2024, 12:02 PM

## 2024-09-20 NOTE — Plan of Care (Signed)
  Problem: Pain Managment: Goal: General experience of comfort will improve and/or be controlled 09/20/2024 1927 by Regino Sayre, RN Outcome: Progressing 09/20/2024 1926 by Regino Sayre, RN Outcome: Progressing 09/20/2024 1926 by Regino Sayre, RN Outcome: Progressing

## 2024-09-20 NOTE — TOC Progression Note (Signed)
 Transition of Care [facility]) - Progression Note    Patient Details  Name: [name] MRN: [id number] Date of Birth: 1981/05/07  Transition of [facility]) CM/SW Contact  Inocente GORMAN Kindle, LCSW Phone Number: 09/20/2024, 3:36 PM  Clinical Narrative:    Inpatient Care Management received consult for Hospice facility referral. Patient and family prefer [facility]. Referral sent to [facility] for review.     Expected Discharge Plan: Hospice Medical Facility Barriers to Discharge: Hospice Bed not available               Expected Discharge Plan and Services In-house Referral: Clinical Social Work, Hospice / Palliative Care   Post Acute Care Choice: Hospice Living arrangements for the past 2 months: Single Family Home                                       Social Drivers of Health (SDOH) Interventions SDOH Screenings   Food Insecurity: Patient Declined (09/04/2024)  Housing: Low Risk  (05/20/2024)  Transportation Needs: No Transportation Needs (05/20/2024)  Utilities: Not At Risk (05/20/2024)  Alcohol  Screen: Low Risk  (05/31/2024)  Depression (PHQ2-9): High Risk (05/31/2024)  Financial Resource Strain: Low Risk  (05/31/2024)  Physical Activity: Inactive (05/31/2024)  Social Connections: Socially Isolated (05/31/2024)  Stress: Stress Concern Present (05/31/2024)  Tobacco Use: High Risk (09/05/2024)  Health Literacy: Adequate Health Literacy (05/31/2024)    Readmission Risk Interventions    09/20/2024    3:35 PM 05/22/2024   11:49 AM 04/17/2022    8:36 AM  Readmission Risk Prevention Plan  Transportation Screening Complete Complete Complete  PCP or Specialist Appt within 3-5 Days   Complete  HRI or Home Care Consult   Complete  Social Work Consult for Recovery Care Planning/Counseling   Complete  Palliative Care Screening   Not Applicable  Medication Review [employer]) Complete Complete Complete  PCP or Specialist appointment within 3-5 days of discharge Complete Complete    HRI or Home Care Consult Complete Complete   SW Recovery Care/Counseling Consult Complete    Palliative Care Screening Complete Not Applicable   Skilled Nursing Facility Not Applicable Not Applicable

## 2024-09-20 NOTE — Progress Notes (Signed)
 PROGRESS NOTE        PATIENT DETAILS Name: [name] Age: 43 y.o. Sex: female Date of Birth: 1981-03-23 Admit Date: 08/31/2024 Admitting Physician Chi Slater Staff, MD ERE:Duzeyzwd, Amy J, NP  Brief Summary: Patient is a 43 y.o.  female with a history of ESRD (lupus nephritis), chronic HFrEF, atrial fibrillation, chronic hypotension who presented with progressive shortness of breath-found to have volume overload (compliant with HD)-with hypotension-patient was started on pressors-and subsequently admitted to the ICU.  Unfortunately-continue to have persistent hypotension limiting dialysis-evaluated by cardiology-underwent RHC which showed severe biventricular heart failure with elevated filling pressures-not felt to be a candidate for aggressive care.  Palliative care.  Nephrology/cardiology followed closely-transferred to [facility] on 9/24.  Significant events: 9/4>>Admitted to the ICU due to volume overload, and respiratory distress. HD 1.1L 9/5>>HD removed 1 with levophed  9/6>>HD removed 2L with levophed  9/7>>Weaning pressors 9/8>> transferred to [facility]-palliative care consulted-seen by vascular surgery for bleeding from right AV fistula-suture placed.  Ongoing hypotension with HD.  9/9 -9/11>> poor oral intake-fatigue/lethargy-started on D10 infusion-hypotension reoccurred-improved with IV fluid bolus.   9/12>> back to the ICU for hypotension-hypoxia-on BiPAP at high settings.  9/13>> iHD, able to come off bipap 9/16>> right heart catheterization >> severe biventricular failure with elevated filling pressures, severe TR (PAP 40/30 (33), PA OP 29) 9/21 underwent intermittent HD with marginal blood pressures, ultimately 500 cc UF 9/23>> dc cortef . iHD attempt  9/24>> transferred to [facility]-patient spoke with nephrology team-normal dialysis-focus on comfort-palliative care to talk with family regarding hospice options.  Significant studies: 9/04>> CXR: Diffuse  interstitial/alveolar opacities bilaterally.  Significant microbiology data: 9/04>> COVID/influenza/RSV PCR: Negative 9/04>> respiratory virus panel: Not detected 9/04>> blood culture: No growth  Procedures: 9/16>> RHC  Consults: PCCM Cardiology Nephrology Palliative care Vascular surgery  Subjective: Incredibly frail-fatigued appearing-complains of being tired.  No family at bedside.  Very debilitated appearing.  Objective: Vitals: Blood pressure (!) 98/43, pulse (!) 105, temperature 97.7 F (36.5 C), temperature source Axillary, resp. rate (!) 24, height 5' 2 (1.575 m), weight 36.7 kg, SpO2 98%.   Exam: Gen Exam: Not in any acute distress but frail/very deconditioned appearing.  Slow to move. HEENT:atraumatic, normocephalic Chest: B/L clear to auscultation anteriorly CVS:S1S2 regular Abdomen:soft non tender, non distended Extremities: Trace edema Neurology: Non focal Skin: no rash  Pertinent Labs/Radiology:    Latest Ref Rng & Units 09/19/2024    5:26 AM 09/18/2024   10:32 PM 09/18/2024    4:44 AM  CBC  WBC 4.0 - 10.5 K/uL 18.6   15.6   Hemoglobin 12.0 - 15.0 g/dL 88.6  86.0  88.8   Hematocrit 36.0 - 46.0 % 35.1  41.0  34.4   Platelets 150 - 400 K/uL 127   157     Lab Results  Component Value Date   NA 131 (L) 09/19/2024   K 5.4 (H) 09/19/2024   CL 97 (L) 09/19/2024   CO2 14 (L) 09/19/2024      Assessment/Plan: Acute metabolic encephalopathy Probably secondary uremia, some component of possible ICU delirium Supportive care Delirium precautions  Acute hypoxic respiratory failure Secondary to acute pulmonary edema/HFrEF exacerbation Required BiPAP-currently stable on nasal cannula-around 5 L.  Hypotension Felt to be due to end-stage heart failure BP just about stable/soft on midodrine . Required pressors while in the ICU. Prior cortisol level stable.  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 She looks incredibly frail-debilitated-in very fatigued this morning Appreciate nephrology input 9/24-they spoke with patient-she desires to stop dialysis-understands that this will mean likely death-palliative care involved-they will reach out to family-try to get hospice services arranged.  Acute on chronic HFrEF with RV failure Hypotension limiting volume removal with HD Per cardiology-not a candidate for advanced therapies See above regarding palliative care/plans for hospice.  Chronic atrial fibrillation Amiodarone  Not a candidate for [facility] secondary to SDH/recurrent falls.  Moderate mitral regurgitation, severe tricuspid regurgitation, moderate to severe aortic regurgitation Per cardiology-not a candidate for any intervention. Supportive care  History of cocaine  use + UDS May 2025 Counseled.  Severe failure to thrive syndrome/debility/deconditioning Incredibly frail appearing Suspect this is end-stage failure to thrive at this point Palliative care will reach out to family to offer hospice choices. Very poor prognosis-DNR in place.  Nutrition Status: Nutrition Problem: Severe Malnutrition Etiology: chronic illness (ESRD, HFrEF) Signs/Symptoms: severe fat depletion, severe muscle depletion Interventions: Magic cup, MVI, Nepro shake, Refer to RD note for recommendations  Pressure Ulcer: Agree with assessment as outlined below. Wound 09/16/24 1030 Pressure Injury Buttocks Medial Stage 2 -  Partial thickness loss of dermis presenting as a shallow open injury with a red, pink wound bed without slough. (Active)   Underweight: Estimated body mass index is 14.8 kg/m as calculated from the following:   Height as of this encounter: 5' 2 (1.575 m).   Weight as of this encounter: 36.7 kg.   Code status:   Code Status: Do not attempt  resuscitation (DNR) PRE-ARREST INTERVENTIONS DESIRED   DVT Prophylaxis: heparin  injection 5,000 Units Start: 09/09/24 1845 SCDs Start: 08/31/24 1052   Family Communication: None at bedside   Disposition Plan: Status is: Inpatient Remains inpatient appropriate because: Severity of illness   Planned Discharge Destination:Hospice care   Diet: Diet Order             Diet regular Room service appropriate? Yes with Assist; Fluid consistency: Thin; Fluid restriction: 1200 mL Fluid  Diet effective now                     Antimicrobial agents: Anti-infectives (From admission, onward)    Start     Dose/Rate Route Frequency Ordered Stop   09/02/24 0900  Levofloxacin  (LEVAQUIN ) IVPB 250 mg  Status:  Discontinued        250 mg 50 mL/hr over 60 Minutes Intravenous Every 48 hours 08/31/24 1828 09/01/24 1024   08/31/24 0915  levofloxacin  (LEVAQUIN ) IVPB 750 mg        750 mg 100 mL/hr over 90 Minutes Intravenous  Once 08/31/24 0913 08/31/24 1120        MEDICATIONS: Scheduled Meds:  amiodarone   200 mg Per Tube BID   Chlorhexidine  Gluconate Cloth  6 each Topical Daily   Chlorhexidine  Gluconate Cloth  6 each Topical Q0600   feeding supplement (PROSource TF20)  60 mL Per Tube TID   folic acid   1 mg Per Tube Daily   heparin   5,000 Units Subcutaneous Q8H   midodrine   15 mg Per Tube TID WC   modafinil   100 mg Per Tube Daily   multivitamin  1 tablet Per Tube QHS   mouth rinse  15 mL Mouth Rinse 4 times per day   sertraline   25 mg Per  Tube Daily   thiamine   100 mg Per Tube Daily   Continuous Infusions:  anticoagulant sodium citrate      feeding supplement (VITAL AF 1.2 CAL) 50 mL/hr at 09/20/24 0600   PRN Meds:.acetaminophen , alteplase , anticoagulant sodium citrate , docusate sodium , feeding supplement (NEPRO CARB STEADY), Gerhardt's butt cream, heparin , heparin , HYDROmorphone  (DILAUDID ) injection, lidocaine  (PF), lidocaine  (PF), lidocaine -prilocaine , lidocaine -prilocaine ,  LORazepam , LORazepam , melatonin, midodrine , naLOXone  (NARCAN )  injection, mouth rinse, oxyCODONE  **OR** oxyCODONE , pentafluoroprop-tetrafluoroeth, pentafluoroprop-tetrafluoroeth, polyethylene glycol   I have personally reviewed following labs and imaging studies  LABORATORY DATA: CBC: Recent Labs  Lab 09/14/24 0413 09/15/24 0412 09/16/24 0443 09/18/24 0444 09/18/24 2232 09/19/24 0526  WBC 14.2* 15.1* 17.0* 15.6*  --  18.6*  NEUTROABS  --   --   --   --   --  17.0*  HGB 10.4* 10.7* 10.4* 11.1* 13.9 11.3*  HCT 31.6* 32.4* 31.3* 34.4* 41.0 35.1*  MCV 86.3 88.3 87.2 92.5  --  91.4  PLT 191 182 174 157  --  127*    Basic Metabolic Panel: Recent Labs  Lab 09/13/24 1628 09/14/24 0413 09/15/24 0412 09/15/24 1635 09/16/24 0443 09/17/24 0433 09/18/24 0444 09/18/24 2232 09/19/24 0526  NA 134* 133* 134* 132* 131* 130* 130* 132* 131*  K 4.5 4.4 5.0 4.9 5.2* 4.3 5.0 5.0 5.4*  CL 101 98 100 98 98 96* 96*  --  97*  CO2 19* 20* 18* 17* 14* 14* 14*  --  14*  GLUCOSE 140* 134* 153* 150* 153* 110* 107*  --  136*  BUN 30* 32* 78* 113* 142* 81* 133*  --  186*  CREATININE 1.48* 1.27* 2.14* 2.71* 3.16* 2.26* 3.36*  --  4.35*  CALCIUM  9.3 9.3 9.6 9.4 9.2 9.3 9.1  --  8.9  MG  --  2.6* 2.9*  --  3.0* 2.5* 2.9*  --   --   PHOS 3.0 2.3*  --   --  4.2 5.7* 6.9*  --   --     GFR: Estimated Creatinine Clearance: 10 mL/min (A) (by C-G formula based on SCr of 4.35 mg/dL (H)).  Liver Function Tests: Recent Labs  Lab 09/13/24 1628 09/14/24 0413  ALBUMIN  2.0* 2.2*   No results for input(s): LIPASE, AMYLASE in the last 168 hours. No results for input(s): AMMONIA in the last 168 hours.  Coagulation Profile: No results for input(s): INR, PROTIME in the last 168 hours.  Cardiac Enzymes: No results for input(s): CKTOTAL, CKMB, CKMBINDEX, TROPONINI in the last 168 hours.  BNP (last 3 results) No results for input(s): PROBNP in the last 8760 hours.  Lipid Profile: No  results for input(s): CHOL, HDL, LDLCALC, TRIG, CHOLHDL, LDLDIRECT in the last 72 hours.  Thyroid Function Tests: No results for input(s): TSH, T4TOTAL, FREET4, T3FREE, THYROIDAB in the last 72 hours.  Anemia Panel: No results for input(s): VITAMINB12, FOLATE, FERRITIN, TIBC, IRON , RETICCTPCT in the last 72 hours.  Urine analysis:    Component Value Date/Time   COLORURINE YELLOW 09/25/2015 1412   APPEARANCEUR CLOUDY (A) 09/25/2015 1412   LABSPEC 1.018 09/25/2015 1412   PHURINE 8.0 09/25/2015 1412   GLUCOSEU NEGATIVE 09/25/2015 1412   HGBUR LARGE (A) 09/25/2015 1412   BILIRUBINUR NEGATIVE 09/25/2015 1412   KETONESUR NEGATIVE 09/25/2015 1412   PROTEINUR >300 (A) 09/25/2015 1412   UROBILINOGEN 0.2 09/25/2015 1412   NITRITE NEGATIVE 09/25/2015 1412   LEUKOCYTESUR MODERATE (A) 09/25/2015 1412    Sepsis Labs: Lactic Acid, Venous    Component Value Date/Time  LATICACIDVEN 2.8 (HH) 09/10/2024 0934    MICROBIOLOGY: No results found for this or any previous visit (from the past 240 hours).  RADIOLOGY STUDIES/RESULTS: No results found.   LOS: 20 days   Donalda Applebaum, MD  [facility]    To contact the attending provider between 7A-7P or the covering provider during after hours 7P-7A, please log into the web site [email or web address] and access using universal [place] password for that web site. If you do not have the password, please call the hospital operator.  09/20/2024, 1:23 PM

## 2024-09-20 NOTE — Plan of Care (Deleted)
 ?  Problem: Clinical Measurements: ?Goal: Diagnostic test results will improve ?Outcome: Progressing ?  ?Problem: Safety: ?Goal: Ability to remain free from injury will improve ?Outcome: Progressing ?  ?

## 2024-09-20 NOTE — Plan of Care (Deleted)
  Problem: Clinical Measurements: Goal: Diagnostic test results will improve Outcome: Progressing   Problem: Pain Managment: Goal: General experience of comfort will improve and/or be controlled 09/20/2024 1926 by Regino Sayre, RN Outcome: Progressing 09/20/2024 1926 by Regino Sayre, RN Outcome: Progressing   Problem: Safety: Goal: Ability to remain free from injury will improve Outcome: Progressing

## 2024-09-20 NOTE — Progress Notes (Signed)
 Nutrition Brief Note  Chart reviewed. Pt now transitioning to comfort care. TF orders discontinued by MD No further nutrition interventions planned at this time.  Please re-consult as needed.   Romelle Starcher MS, RDN, LDN, CNSC Registered Dietitian 3 Clinical Nutrition RD Inpatient Contact Info in Amion

## 2024-09-21 DIAGNOSIS — Z7189 Other specified counseling: Secondary | ICD-10-CM | POA: Diagnosis not present

## 2024-09-21 DIAGNOSIS — Z66 Do not resuscitate: Secondary | ICD-10-CM | POA: Diagnosis not present

## 2024-09-21 DIAGNOSIS — J810 Acute pulmonary edema: Secondary | ICD-10-CM | POA: Diagnosis not present

## 2024-09-21 DIAGNOSIS — I483 Typical atrial flutter: Secondary | ICD-10-CM | POA: Diagnosis not present

## 2024-09-21 DIAGNOSIS — Z789 Other specified health status: Secondary | ICD-10-CM | POA: Diagnosis not present

## 2024-09-21 DIAGNOSIS — Z515 Encounter for palliative care: Secondary | ICD-10-CM | POA: Diagnosis not present

## 2024-09-21 DIAGNOSIS — E162 Hypoglycemia, unspecified: Secondary | ICD-10-CM | POA: Diagnosis not present

## 2024-09-21 DIAGNOSIS — M3214 Glomerular disease in systemic lupus erythematosus: Secondary | ICD-10-CM | POA: Diagnosis not present

## 2024-09-21 MED ORDER — SODIUM CHLORIDE 0.9 % IV SOLN
0.5000 mg/h | INTRAVENOUS | Status: DC
  Administered 2024-09-21: 0.5 mg/h via INTRAVENOUS
  Filled 2024-09-21: qty 5

## 2024-09-21 MED ORDER — HYDROMORPHONE BOLUS VIA INFUSION
0.5000 mg | INTRAVENOUS | Status: DC | PRN
  Administered 2024-09-21 (×2): 0.5 mg via INTRAVENOUS

## 2024-09-21 MED ORDER — HYDROMORPHONE HCL-NACL 50-0.9 MG/50ML-% IV SOLN
0.5000 mg/h | INTRAVENOUS | Status: DC
  Filled 2024-09-21: qty 50

## 2024-09-27 NOTE — Progress Notes (Signed)
 PROGRESS NOTE        PATIENT DETAILS Name: [name] Age: 43 y.o. Sex: female Date of Birth: [birth year] Admit Date: 08/31/2024 Admitting Physician Chi Slater Staff, MD ERE:Duzeyzwd, Amy J, NP  Brief Summary: Patient is a 43 y.o.  female with a history of ESRD (lupus nephritis), chronic HFrEF, atrial fibrillation, chronic hypotension who presented with progressive shortness of breath-found to have volume overload (compliant with HD)-with hypotension-patient was started on pressors-and subsequently admitted to the ICU.  Unfortunately-continue to have persistent hypotension limiting dialysis-evaluated by cardiology-underwent RHC which showed severe biventricular heart failure with elevated filling pressures-not felt to be a candidate for aggressive care.  Palliative care.  Nephrology/cardiology followed closely-transferred to [facility] on 9/24.  Significant events: 9/4>>Admitted to the ICU due to volume overload, and respiratory distress. HD 1.1L 9/5>>HD removed 1 with levophed  9/6>>HD removed 2L with levophed  9/7>>Weaning pressors 9/8>> transferred to [facility]-palliative care consulted-seen by vascular surgery for bleeding from right AV fistula-suture placed.  Ongoing hypotension with HD.  9/9 -9/11>> poor oral intake-fatigue/lethargy-started on D10 infusion-hypotension reoccurred-improved with IV fluid bolus.   9/12>> back to the ICU for hypotension-hypoxia-on BiPAP at high settings.  9/13>> iHD, able to come off bipap 9/16>> right heart catheterization >> severe biventricular failure with elevated filling pressures, severe TR (PAP 40/30 (33), PA OP 29) 9/21 underwent intermittent HD with marginal blood pressures, ultimately 500 cc UF 9/23>> dc cortef . iHD attempt  9/24>> transferred to [facility]-patient spoke with nephrology team-normal dialysis-focus on comfort-palliative care discussed with family-has been transition to full comfort measures.  Significant studies: 9/04>>  CXR: Diffuse interstitial/alveolar opacities bilaterally.  Significant microbiology data: 9/04>> COVID/influenza/RSV PCR: Negative 9/04>> respiratory virus panel: Not detected 9/04>> blood culture: No growth  Procedures: 9/16>> RHC  Consults: PCCM Cardiology Nephrology Palliative care Vascular surgery  Subjective: Barely awake-appears to be actively dying-labored breathing at times.  Objective: Vitals: Blood pressure (!) 93/43, pulse (!) 108, temperature 97.7 F (36.5 C), temperature source Axillary, resp. rate 17, height 5' 2 (1.575 m), weight 36.7 kg, SpO2 96%.   Exam: Lethargic-barely arousable-labored breathing at times  Pertinent Labs/Radiology:    Latest Ref Rng & Units 09/19/2024    5:26 AM 09/18/2024   10:32 PM 09/18/2024    4:44 AM  CBC  WBC 4.0 - 10.5 K/uL 18.6   15.6   Hemoglobin 12.0 - 15.0 g/dL 88.6  86.0  88.8   Hematocrit 36.0 - 46.0 % 35.1  41.0  34.4   Platelets 150 - 400 K/uL 127   157     Lab Results  Component Value Date   NA 131 (L) 09/19/2024   K 5.4 (H) 09/19/2024   CL 97 (L) 09/19/2024   CO2 14 (L) 09/19/2024      Assessment/Plan: Advanced directive/palliative care Made comfort care on 9/24 Actively dying-palliative care starting narcotic infusion Expect inpatient death at this point.  Acute metabolic encephalopathy Probably secondary uremia, some component of possible ICU delirium  Acute hypoxic respiratory failure Secondary to acute pulmonary edema/HFrEF exacerbation Required BiPAP while in the ICU  Hypotension Felt to be due to end-stage heart failure No longer on midodrine -full comfort measures Required pressors while in the ICU. Prior cortisol level stable.  ESRD (secondary to lupus nephritis) on HD Underwent CRRT while in the ICU-at times required vasopressors Did tolerate HD on 9/23 with midodrine  Incredibly frail/debilitated-nephrology discussed  with patient on 9/24-she stated she desired to stop hemodialysis-knowing  full well that this meant that she will likely pass away.  See above regarding palliative care.  Acute on chronic HFrEF with RV failure Hypotension limiting volume removal with HD Per cardiology-not a candidate for advanced therapies  Chronic atrial fibrillation No longer amiodarone -as comfort care. Not a candidate for [facility] secondary to SDH/recurrent falls.  Moderate mitral regurgitation, severe tricuspid regurgitation, moderate to severe aortic regurgitation Per cardiology-not a candidate for any intervention.  History of cocaine  use + UDS May 2025 Counseled.  Severe failure to thrive syndrome/debility/deconditioning Incredibly frail appearing Suspect this is end-stage failure to thrive at this point Full comfort care-actively dying.  Nutrition Status: Nutrition Problem: Severe Malnutrition Etiology: chronic illness (ESRD, HFrEF) Signs/Symptoms: severe fat depletion, severe muscle depletion Interventions: Magic cup, MVI, Nepro shake, Refer to RD note for recommendations  Pressure Ulcer: Agree with assessment as outlined below. Wound 09/16/24 1030 Pressure Injury Buttocks Medial Stage 2 -  Partial thickness loss of dermis presenting as a shallow open injury with a red, pink wound bed without slough. (Active)   Underweight: Estimated body mass index is 14.8 kg/m as calculated from the following:   Height as of this encounter: 5' 2 (1.575 m).   Weight as of this encounter: 36.7 kg.   Code status:   Code Status: Do not attempt resuscitation (DNR) - Comfort care   DVT Prophylaxis: Not needed-comfort care   Family Communication: None at bedside   Disposition Plan: Status is: Inpatient Remains inpatient appropriate because: Severity of illness   Planned Discharge Destination:Hospice care   Diet: Diet Order             Diet regular Room service appropriate? Yes with Assist; Fluid consistency: Thin; Fluid restriction: 1200 mL Fluid  Diet effective now                      Antimicrobial agents: Anti-infectives (From admission, onward)    Start     Dose/Rate Route Frequency Ordered Stop   09/02/24 0900  Levofloxacin  (LEVAQUIN ) IVPB 250 mg  Status:  Discontinued        250 mg 50 mL/hr over 60 Minutes Intravenous Every 48 hours 08/31/24 1828 09/01/24 1024   08/31/24 0915  levofloxacin  (LEVAQUIN ) IVPB 750 mg        750 mg 100 mL/hr over 90 Minutes Intravenous  Once 08/31/24 0913 08/31/24 1120        MEDICATIONS: Scheduled Meds:  Chlorhexidine  Gluconate Cloth  6 each Topical Daily   Chlorhexidine  Gluconate Cloth  6 each Topical Q0600   mouth rinse  15 mL Mouth Rinse 4 times per day   Continuous Infusions:  HYDROmorphone      PRN Meds:.acetaminophen , antiseptic oral rinse, artificial tears, docusate sodium , Gerhardt's butt cream, glycopyrrolate  **OR** glycopyrrolate  **OR** glycopyrrolate , haloperidol  **OR** haloperidol  **OR** haloperidol  lactate, HYDROmorphone  **AND** HYDROmorphone , HYDROmorphone  (DILAUDID ) injection, loperamide , LORazepam , LORazepam , melatonin, ondansetron  **OR** ondansetron  (ZOFRAN ) IV, mouth rinse, oxyCODONE  **OR** oxyCODONE , polyethylene glycol   I have personally reviewed following labs and imaging studies  LABORATORY DATA: CBC: Recent Labs  Lab 09/15/24 0412 09/16/24 0443 09/18/24 0444 09/18/24 2232 09/19/24 0526  WBC 15.1* 17.0* 15.6*  --  18.6*  NEUTROABS  --   --   --   --  17.0*  HGB 10.7* 10.4* 11.1* 13.9 11.3*  HCT 32.4* 31.3* 34.4* 41.0 35.1*  MCV 88.3 87.2 92.5  --  91.4  PLT 182 174 157  --  127*    Basic Metabolic Panel: Recent Labs  Lab 09/15/24 0412 09/15/24 1635 09/16/24 0443 09/17/24 0433 09/18/24 0444 09/18/24 2232 09/19/24 0526  NA 134* 132* 131* 130* 130* 132* 131*  K 5.0 4.9 5.2* 4.3 5.0 5.0 5.4*  CL 100 98 98 96* 96*  --  97*  CO2 18* 17* 14* 14* 14*  --  14*  GLUCOSE 153* 150* 153* 110* 107*  --  136*  BUN 78* 113* 142* 81* 133*  --  186*  CREATININE 2.14* 2.71* 3.16* 2.26*  3.36*  --  4.35*  CALCIUM  9.6 9.4 9.2 9.3 9.1  --  8.9  MG 2.9*  --  3.0* 2.5* 2.9*  --   --   PHOS  --   --  4.2 5.7* 6.9*  --   --     GFR: Estimated Creatinine Clearance: 10 mL/min (A) (by C-G formula based on SCr of 4.35 mg/dL (H)).  Liver Function Tests: No results for input(s): AST, ALT, ALKPHOS, BILITOT, PROT, ALBUMIN  in the last 168 hours.  No results for input(s): LIPASE, AMYLASE in the last 168 hours. No results for input(s): AMMONIA in the last 168 hours.  Coagulation Profile: No results for input(s): INR, PROTIME in the last 168 hours.  Cardiac Enzymes: No results for input(s): CKTOTAL, CKMB, CKMBINDEX, TROPONINI in the last 168 hours.  BNP (last 3 results) No results for input(s): PROBNP in the last 8760 hours.  Lipid Profile: No results for input(s): CHOL, HDL, LDLCALC, TRIG, CHOLHDL, LDLDIRECT in the last 72 hours.  Thyroid Function Tests: No results for input(s): TSH, T4TOTAL, FREET4, T3FREE, THYROIDAB in the last 72 hours.  Anemia Panel: No results for input(s): VITAMINB12, FOLATE, FERRITIN, TIBC, IRON , RETICCTPCT in the last 72 hours.  Urine analysis:    Component Value Date/Time   COLORURINE YELLOW 09/25/2015 1412   APPEARANCEUR CLOUDY (A) 09/25/2015 1412   LABSPEC 1.018 09/25/2015 1412   PHURINE 8.0 09/25/2015 1412   GLUCOSEU NEGATIVE 09/25/2015 1412   HGBUR LARGE (A) 09/25/2015 1412   BILIRUBINUR NEGATIVE 09/25/2015 1412   KETONESUR NEGATIVE 09/25/2015 1412   PROTEINUR >300 (A) 09/25/2015 1412   UROBILINOGEN 0.2 09/25/2015 1412   NITRITE NEGATIVE 09/25/2015 1412   LEUKOCYTESUR MODERATE (A) 09/25/2015 1412    Sepsis Labs: Lactic Acid, Venous    Component Value Date/Time   LATICACIDVEN 2.8 (HH) 09/10/2024 0934    MICROBIOLOGY: No results found for this or any previous visit (from the past 240 hours).  RADIOLOGY STUDIES/RESULTS: No results found.   LOS: 21 days    Donalda Applebaum, MD  [facility]    To contact the attending provider between 7A-7P or the covering provider during after hours 7P-7A, please log into the web site [email or web address] and access using universal [place] password for that web site. If you do not have the password, please call the hospital operator.  2024/09/29, 10:36 AM

## 2024-09-27 NOTE — Progress Notes (Signed)
 Daily Progress Note   Date: September 28, 2024   Patient Name: [name]  DOB: [birth year]  MRN: [id number]  Age / Sex: 43 y.o., female  Attending Physician: Raenelle Donalda HERO, MD Primary Care Physician: Lorren Greig PARAS, NP Admit Date: 08/31/2024 Length of Stay: 21 days  Reason for Follow-up: Non pain symptom management, Pain control, Psychosocial/spiritual support, and Terminal Care  Past Medical History:  Diagnosis Date   Anemia    low iron  - receives iron  at dialysis   Anxiety    Arthritis    RA   Atrial fibrillation with RVR (HCC) 12/14/2023   Chronic systolic congestive heart failure (HCC) 03/16/2016   Dyspnea    ESRD (end stage renal disease) (HCC)    [facility] _ [place]   H/O pericarditis 01/17/2013   H/O pleural effusion 01/17/2013   Heart murmur    Lupus (systemic lupus erythematosus) (HCC)    Previously followed with Dr. 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 pneumonae bacteremia on 01/21/2013. Sensitive to Peniccilin     Subjective:   Subjective: Chart Reviewed. Updates received. Patient Assessed. Created space and opportunity for patient  and family to explore thoughts and feelings regarding current medical situation.  Today's Discussion: Today before meeting with the patient/family, I reviewed the chart notes including dietitian signoff note from yesterday, TOC note from yesterday, nursing note from yesterday, social worker note from today.  Later in the day I reviewed  hospitalist note from today. I also reviewed vital signs, nursing flowsheets, and medication administrations record. No labs due to comfort care status.  Vital signs today include temperature 97.7, heart rate 108, respiratory rate 17, blood pressure 93/43, satting 96% on 5 L nasal cannula.  Comfort medications administered include a dose of Robinul  at 9:00 this morning.  Also received couple doses of Dilaudid  including 3 yesterday and 3 thus far this morning at 2:27 AM, 7:03 AM, 9:06 AM.  Prior to seeing the patient I spoke with the nurse at the bedside.  Notes that she has been receiving Dilaudid  every 2 hours.  I shared that I would evaluate the patient and possibly start her on a Dilaudid  drip if her symptoms remain burdensome.  Today saw the patient at bedside, no family was present.  The patient does seem to have groaning with each breath, does not appear comfortable despite appropriate medication by nursing staff.  She is unresponsive at this time.  After seeing the patient I reach out to her mother and left a voicemail for callback.  I then reached out to her daughter and discussed the situation.  I shared that, unfortunately, it appears she has declined.  I described the symptoms and signs that I am seeing today.  I feel that it is too unstable for her and too uncomfortable for her to try to transfer to inpatient hospice.  I feel that she will likely pass away in the hospital.  I also shared my desire to start a Dilaudid  drip,  primarily focused on her comfort and peace.  I explained how this would allow us  to better manage her symptoms consistently.  Her daughter understands and agrees.  She shares that the patient's mother is at a doctor's appointment right now which she will relay that information to her.  She anticipates that mother and other family members are coming after the doctors appointment.  I shared a palliative medicine will continue to follow while she is admitted to the hospital. I  provided emotional and general support through therapeutic listening, empathy, sharing of stories, and other techniques. I answered all questions and addressed all concerns to the best of my ability.  Review of Systems  Unable to perform ROS   Objective:   Primary Diagnoses: Present on Admission:  Acute respiratory failure with hypoxia (HCC)  Acute on chronic combined systolic and diastolic CHF (congestive heart failure) (HCC)  Atrial flutter (HCC)  Fluid overload  Hemodialysis catheter malfunction  Insomnia  Lupus (systemic lupus erythematosus) (HCC)   Vital Signs:  BP (!) 93/43   Pulse (!) 108   Temp 97.7 F (36.5 C) (Axillary)   Resp 17   Ht 5' 2 (1.575 m)   Wt 36.7 kg   LMP  (LMP Unknown) Comment: states over 2 years ago  SpO2 96%   BMI 14.80 kg/m   Physical Exam Vitals and nursing note reviewed.  Constitutional:      General: She is in acute distress.     Comments: Appears to be actively dying, moaning with each breath  HENT:     Head: Normocephalic and atraumatic.  Cardiovascular:     Rate and Rhythm: Tachycardia present.  Pulmonary:     Effort: Pulmonary effort is normal. No respiratory distress.  Abdominal:     General: Abdomen is flat.  Neurological:     Mental Status: She is unresponsive.     Palliative Assessment/Data: 10%   Existing Vynca/ACP Documentation: None  Assessment & Plan:   HPI/Patient Profile:  43 y.o. 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  She was admitted on 08/31/2024 with acute hypoxic respiratory failure, pulmonary edema, pneumonia, acute on chronic heart failure, mitral regurg, tricuspid regurg, aortic regurg, atrial fibrillation, ESRD, lupus nephritis, AGMA, hyperkalemia, severe protein calorie  malnutrition, unintentional weight loss, physical deconditioning/generalized weakness, and others.    Palliative medicine was consulted for GOC conversations.  SUMMARY OF RECOMMENDATIONS   DNR-comfort Continue comfort care Patient has significantly deteriorated, we will hold on transfer to inpatient hospice See symptom management orders below Ongoing supportive patient and family Palliative medicine will continue to follow  Symptom Management:  Tylenol  650 mg PR every 6 hours as needed mild pain or fever Artificial tears ophthalmic 1 drop OU 4 times daily as needed dry eyes Robinul  0.2 mg IV every 4 hours as needed excessive secretions Haldol  0.5 mg IV every 4 hours as needed agitation or delirium STOP Dilaudid  0.5 to 2 mg IV every 2 hours as needed severe pain (7-10), dyspnea, tachypnea (RR greater than 22), increased WOB START Dilaudid  drip IV 0.5 to 4 mg/h titrate per instructions START Dilaudid  bolus via infusion IV 0.5 mg IV every 30 minutes as needed per instructions Imodium  2 mg p.o. as needed diarrhea or loose stools Ativan  0.5 mg IV every  4 hours as needed anxiety Zofran  IV 4 mg every 6 hours as needed nausea or vomiting  Code Status: DNR - Comfort  Prognosis: Hours - Days  Discharge Planning: Anticipated Hospital Death  Discussed with: Patient's family, medical team, nursing team, [facility] team, hospice team  Thank you for allowing us  to participate in the care of LILLEIGH HECHAVARRIA PMT will continue to support holistically.  Billing based on MDM: High  Problems Addressed: One acute or chronic illness or injury that poses a threat to life or bodily function  Risks: Parenteral controlled substances  Detailed review of medical records (labs, imaging, vital signs), medically appropriate exam, discussed with treatment team, counseling and education to patient, family, & staff, documenting clinical information, medication management, coordination of care  [name],  NP Palliative Medicine Team  Team Phone # [phone] (Nights/Weekends)  08/26/2021, 8:17 AM

## 2024-09-27 NOTE — Progress Notes (Signed)
 Patient expired at 1313. Two Rns verified. Notified provider and nurse practitioner. Patient currently surrounded by family members at this time.

## 2024-09-27 NOTE — Death Summary Note (Signed)
 DEATH SUMMARY   Patient Details  Name: [name] MRN: [id number] DOB: 07/20/1981 ERE:Duzeyzwd, Greig PARAS, NP Admission/Discharge Information   Admit Date:  09-25-2024  Date of Death: Date of Death: 2024/10/16  Time of Death: Time of Death: 10-20-12  Length of Stay: October 12, 2024   Principle Cause of death: End stage HFrEF  Hospital Diagnoses: Active Problems:   Hemodialysis catheter malfunction   Atrial flutter (HCC)   Lupus (systemic lupus erythematosus) (HCC)   Insomnia   Acute on chronic combined systolic and diastolic CHF (congestive heart failure) (HCC)   Fluid overload   Acute respiratory failure with hypoxia (HCC)   Cardiogenic shock (HCC)   Palliative care by specialist   Hospital Course: Patient is a 43 y.o.  female with a history of ESRD (lupus nephritis), chronic HFrEF, atrial fibrillation, chronic hypotension who presented with progressive shortness of breath-found to have volume overload (compliant with HD)-with hypotension-patient was started on pressors-and subsequently admitted to the ICU.  Unfortunately-continue to have persistent hypotension limiting dialysis-evaluated by cardiology-underwent RHC which showed severe biventricular heart failure with elevated filling pressures-not felt to be a candidate for aggressive care.  Palliative care.  Nephrology/cardiology followed closely-transferred to [facility] on 9/24. On 9/24-patient expressed her desire to stop HD, palliative care d/w family/family and she was then subsequently transitioned to full comfort measures.   Significant events: 9/4>>Admitted to the ICU due to volume overload, and respiratory distress. HD 1.1L 9/5>>HD removed 1 with levophed  9/6>>HD removed 2L with levophed  9/7>>Weaning pressors 9/8>> transferred to [facility]-palliative care consulted-seen by vascular surgery for bleeding from right AV fistula-suture placed.  Ongoing hypotension with HD.  9/9 -9/11>> poor oral intake-fatigue/lethargy-started on D10  infusion-hypotension reoccurred-improved with IV fluid bolus.   9/12>> back to the ICU for hypotension-hypoxia-on BiPAP at high settings.  9/13>> iHD, able to come off bipap 9/16>> right heart catheterization >> severe biventricular failure with elevated filling pressures, severe TR (PAP 40/30 (33), PA OP 2910-16-25 underwent intermittent HD with marginal blood pressures, ultimately 500 cc UF 9/23>> dc cortef . iHD attempt  9/24>> transferred to [facility]-patient spoke with nephrology team-normal dialysis-focus on comfort-palliative care discussed with family-has been transition to full comfort measures.   Significant studies: 9/04>> CXR: Diffuse interstitial/alveolar opacities bilaterally.   Significant microbiology data: 9/04>> COVID/influenza/RSV PCR: Negative 9/04>> respiratory virus panel: Not detected 9/04>> blood culture: No growth  Procedures: 9/16>> RHC   Consults: PCCM Cardiology Nephrology Palliative care Vascular surgery   Assessment and Plan:   Acute metabolic encephalopathy Probably secondary uremia, some component of possible ICU delirium   Acute hypoxic respiratory failure Secondary to acute pulmonary edema/HFrEF exacerbation Required BiPAP while in the ICU   Hypotension Felt to be due to end-stage heart failure No longer on midodrine -full comfort measures Required pressors while in the ICU. Prior cortisol level stable.   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     Acute on chronic HFrEF with RV failure Hypotension limiting volume removal with HD Per cardiology-not a candidate for advanced therapies   Chronic atrial fibrillation No longer amiodarone -as comfort care. Not a  candidate for [facility] secondary to SDH/recurrent falls.   Moderate mitral regurgitation, severe tricuspid regurgitation, moderate to severe aortic regurgitation Per cardiology-not a candidate for any intervention.   History of  cocaine  use + UDS May 2025 Counseled.   Severe failure to thrive syndrome/debility/deconditioning/Palliative Care Incredibly frail appearing Suspect this is end-stage failure to thrive at this point Very poor prognoses,on 9/24-patient expressed to renal team, that she did not want to pursue any further HD-palliative care subsequently met with patient and family, comfort measures were initiated.   Nutrition Status: Nutrition Problem: Severe Malnutrition Etiology: chronic illness (ESRD, HFrEF) Signs/Symptoms: severe fat depletion, severe muscle depletion Interventions: Magic cup, MVI, Nepro shake, Refer to RD note for recommendations   Pressure Ulcer: Agree with assessment as outlined below. Wound 09/16/24 1030 Pressure Injury Buttocks Medial Stage 2 -  Partial thickness loss of dermis presenting as a shallow open injury with a red, pink wound bed without slough. (Active)   Underweight: Estimated body mass index is 14.8 kg/m as calculated from the following:   Height as of this encounter: 5' 2 (1.575 m).   Weight as of this encounter: 36.7 kg.    The results of significant diagnostics from this hospitalization (including imaging, microbiology, ancillary and laboratory) are listed below for reference.   Significant Diagnostic Studies: DG CHEST PORT 1 VIEW Result Date: 09/17/2024 CLINICAL DATA:  Follow-up pneumonia. EXAM: PORTABLE CHEST 1 VIEW COMPARISON:  September 12, 2024. FINDINGS: Stable cardiomegaly. Feeding tube is seen entering stomach. Left internal jugular catheter is unchanged. Stable diffuse interstitial and airspace opacities are noted bilaterally concerning for edema or pneumonia. Bony thorax is unremarkable. IMPRESSION: Stable support apparatus.  Stable diffuse interstitial and airspace opacities are noted bilaterally concerning for edema or pneumonia. Electronically Signed   By: Lynwood Landy Raddle M.D.   On: 09/17/2024 08:23   DG CHEST PORT 1 VIEW Result Date: 09/12/2024 EXAM: 1 VIEW XRAY OF THE CHEST 09/12/2024 02:33:00 PM COMPARISON: 09/08/2024 CLINICAL HISTORY: Encounter for central line placement. FINDINGS: LINES, TUBES AND DEVICES: Enteric tube coursing below the hemidiaphragm with tip collimated off view. Left internal jugular central venous catheter with tip overlying the expected region of the superior vena cava. LUNGS AND PLEURA: Diffuse interstitial and airspace opacities similar to prior. Possible trace bilateral pleural effusions. No pneumothorax. HEART AND MEDIASTINUM: Stable cardiomegaly. Aortic calcification. BONES AND SOFT TISSUES: Old healed right rib fractures. IMPRESSION: 1. Diffuse interstitial and airspace opacities, similar to prior. 2. Left internal jugular central venous catheter and enteric tube in appropriate positions, with the tip of the enteric tube collimated off view. Electronically signed by: Norman Gatlin MD 09/12/2024 02:53 PM EDT RP Workstation: [id number]   CARDIAC CATHETERIZATION Result Date: 09/12/2024 HEMODYNAMICS: RA:   20 mmHg with large V waves / ventricularization of wave form RV:   39/15-20 mmHg PA:   40/30 mmHg (33 mean) PCWP:  29 mmHg (mean)    Estimated Fick CO/CI   2 L/min, 1.5 L/min/m2 Thermodilution CO/CI   2.7 L/min, 2 L/min/m2 AO pressure via right femoral artery: 83/30    TPG    4  mmHg     PVR     ~2 Wood Units PAPi      0.5  IMPRESSION: Severely elevated pre and post capillary filling pressures RA:CVP ratio, PAPi and RA waveform consistent with severe RV failure with severe tricuspid regurgitation. Severely reduced cardiac output / index. RFA arterial line placed without complications. Aditya Sabharwal 11:56 AM   DG Chest Port 1 View Result Date: 09/08/2024 CLINICAL DATA:  Pulmonary edema. EXAM:  PORTABLE CHEST 1 VIEW COMPARISON:  September 05, 2024. FINDINGS: Stable cardiomegaly. Mildly increased diffuse bilateral lung opacities are noted suggesting worsening pneumonia  or edema. Bony thorax is unremarkable. IMPRESSION: Mildly increased diffuse bilateral lung opacities as described above. Electronically Signed   By: Lynwood Landy Raddle M.D.   On: 09/08/2024 17:38   DG CHEST PORT 1 VIEW Result Date: 09/05/2024 CLINICAL DATA:  Shortness of breath. EXAM: PORTABLE CHEST 1 VIEW COMPARISON:  September 03, 2024 FINDINGS: The cardiac silhouette is enlarged and unchanged in size. Marked severity calcification of the aortic arch is seen. There is stable mild to moderate severity diffuse interstitial and airspace opacities. There is a small left pleural effusion. No pneumothorax is identified. The visualized skeletal structures are unremarkable. IMPRESSION: Stable cardiomegaly with findings likely consistent with stable mild to moderate severity pulmonary edema. Electronically Signed   By: Suzen Dials M.D.   On: 09/05/2024 10:37   DG CHEST PORT 1 VIEW Result Date: 09/03/2024 CLINICAL DATA:  Hypoxemia. EXAM: PORTABLE CHEST 1 VIEW COMPARISON:  August 31, 2024. FINDINGS: Stable cardiomegaly. Stable diffuse interstitial and airspace opacities are noted concerning for pulmonary edema or diffuse atypical infection. Old right rib fracture is noted. Possible small left pleural effusion. IMPRESSION: Stable diffuse interstitial and airspace opacities are noted concerning for pulmonary edema or diffuse atypical infection. Electronically Signed   By: Lynwood Landy Raddle M.D.   On: 09/03/2024 10:52   ECHOCARDIOGRAM COMPLETE Result Date: 09/01/2024    ECHOCARDIOGRAM REPORT   Patient Name:   [name] Date of Exam: 09/01/2024 Medical Rec #:  [id number]      Height:       62.0 in Accession #:    [id number]     Weight:       86.6 lb Date of Birth:  Jul 17, 1981     BSA:          1.340 m Patient Age:    42 years       BP:            112/53 mmHg Patient Gender: F              HR:           116 bpm. Exam Location:  Inpatient Procedure: 2D Echo, Color Doppler and Cardiac Doppler (Both Spectral and Color            Flow Doppler were utilized during procedure). Indications:    CHF I50.9  History:        Patient has prior history of Echocardiogram examinations, most                 recent 05/19/2024. CHF; Arrythmias:Atrial Fibrillation.  Sonographer:    Tinnie Gosling RDCS Referring Phys: MISSY SANDHOFF IMPRESSIONS  1. Left ventricular ejection fraction, by estimation, is 30 to 35%. The left ventricle has moderately decreased function. The left ventricle demonstrates global hypokinesis. The left ventricular internal cavity size was moderately dilated. There is mild  left ventricular hypertrophy. Left ventricular diastolic function could not be evaluated.  2. Right ventricular systolic function is severely reduced. The right ventricular size is enlarged. There is mildly elevated pulmonary artery systolic pressure.  3. Left atrial size was severely dilated.  4. Right atrial size was severely dilated.  5. The mitral valve is degenerative. Moderate mitral valve regurgitation. No evidence of mitral stenosis.  6. The tricuspid valve is degenerative. Tricuspid valve regurgitation is severe.  7. The aortic valve is tricuspid. There is moderate calcification of the aortic valve. There is moderate thickening of the aortic valve. Aortic valve regurgitation is moderate to severe. No aortic stenosis is  present.  8. The inferior vena cava is dilated in size with <50% respiratory variability, suggesting right atrial pressure of 15 mmHg. Comparison(s): A prior study was performed on 05/19/2024. Conclusion(s)/Recommendation(s): Consider workup for cardiomyopathy ddx includes but not limited too: non-compaction (see apical two chamber), infiltrative process (i.e. cardiac amyloidosis, etc), clinical correlation required. FINDINGS  Left Ventricle: Left ventricular  ejection fraction, by estimation, is 30 to 35%. The left ventricle has moderately decreased function. The left ventricle demonstrates global hypokinesis. The left ventricular internal cavity size was moderately dilated. There is mild left ventricular hypertrophy. Left ventricular diastolic function could not be evaluated due to nondiagnostic images. Left ventricular diastolic function could not be evaluated. Right Ventricle: The right ventricular size is enlarged. Right vetricular wall thickness was not well visualized. Right ventricular systolic function is severely reduced. There is mildly elevated pulmonary artery systolic pressure. The tricuspid regurgitant velocity is 2.71 m/s, and with an assumed right atrial pressure of 15 mmHg, the estimated right ventricular systolic pressure is 44.4 mmHg. Left Atrium: Left atrial size was severely dilated. Right Atrium: Right atrial size was severely dilated. Pericardium: There is no evidence of pericardial effusion. Mitral Valve: The mitral valve is degenerative in appearance. Moderate mitral valve regurgitation. No evidence of mitral valve stenosis. Tricuspid Valve: The tricuspid valve is degenerative in appearance. Tricuspid valve regurgitation is severe. No evidence of tricuspid stenosis. Aortic Valve: The aortic valve is tricuspid. There is moderate calcification of the aortic valve. There is moderate thickening of the aortic valve. There is moderate to severe aortic valve annular calcification. Aortic valve regurgitation is moderate to severe. No aortic stenosis is present. Aortic valve mean gradient measures 6.5 mmHg. Aortic valve peak gradient measures 12.0 mmHg. Aortic valve area, by VTI measures 2.15 cm. Pulmonic Valve: The pulmonic valve was grossly normal. Pulmonic valve regurgitation is mild to moderate. No evidence of pulmonic stenosis. Aorta: The aortic root and ascending aorta are structurally normal, with no evidence of dilitation. Venous: The inferior  vena cava is dilated in size with less than 50% respiratory variability, suggesting right atrial pressure of 15 mmHg. IAS/Shunts: The interatrial septum was not well visualized.  LEFT VENTRICLE PLAX 2D LVIDd:         5.70 cm LVIDs:         4.70 cm LV PW:         1.20 cm LV IVS:        1.20 cm LVOT diam:     2.00 cm LV SV:         54 LV SV Index:   40 LVOT Area:     3.14 cm  RIGHT VENTRICLE            IVC RV S prime:     6.20 cm/s  IVC diam: 2.30 cm TAPSE (M-mode): 0.8 cm LEFT ATRIUM              Index         RIGHT ATRIUM           Index LA diam:        4.70 cm  3.51 cm/m    RA Area:     24.70 cm LA Vol (A2C):   139.0 ml 103.75 ml/m  RA Volume:   87.80 ml  65.54 ml/m LA Vol (A4C):   101.0 ml 75.39 ml/m LA Biplane Vol: 119.0 ml 88.83 ml/m  AORTIC VALVE AV Area (Vmax):    2.06 cm AV Area (Vmean):   2.18 cm  AV Area (VTI):     2.15 cm AV Vmax:           173.50 cm/s AV Vmean:          118.000 cm/s AV VTI:            0.250 m AV Peak Grad:      12.0 mmHg AV Mean Grad:      6.5 mmHg LVOT Vmax:         114.00 cm/s LVOT Vmean:        81.800 cm/s LVOT VTI:          0.171 m LVOT/AV VTI ratio: 0.69  AORTA Ao Root diam: 3.10 cm Ao Asc diam:  2.90 cm TRICUSPID VALVE TR Peak grad:   29.4 mmHg TR Vmax:        271.00 cm/s  SHUNTS Systemic VTI:  0.17 m Systemic Diam: 2.00 cm Sunit Tolia Electronically signed by Madonna Large Signature Date/Time: 09/01/2024/1:57:26 PM    Final    DG Chest 2 View Result Date: 08/31/2024 CLINICAL DATA:  Shortness of breath.  Respiratory distress. EXAM: CHEST - 2 VIEW COMPARISON:  05/22/2024 FINDINGS: The cardio pericardial silhouette is enlarged. Diffuse interstitial and alveolar opacity again noted bilaterally, similar to prior. Probable small left pleural effusion. Telemetry leads overlie the chest. 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 IMPRESSION: Diffuse interstitial and alveolar opacity bilaterally, similar to prior. Imaging features suggest pulmonary edema. Electronically Signed    By: Camellia Candle M.D.   On: 08/31/2024 08:13   PERIPHERAL VASCULAR CATHETERIZATION Result Date: 08/23/2024 Images from the original result were not included. Patient name: PHEONIX WISBY MRN: [id number] DOB: Feb 26, 1981 Sex: female 08/23/2024 Pre-operative Diagnosis: ESRD on HD Post-operative diagnosis:  Same Surgeon:  Norman GORMAN Serve, MD Procedure Performed: Ultrasound-guided access of right arm AV fistula Fistulogram and central venogram Balloon angioplasty of fistula, venous anastomosis, 10 mm x 40 mm Mustang Balloon angioplasty of central lesion, innominate, 10 mm x 40 mm Mustang Indications: Ms. Spadafora is a 43 year old female with ESRD presents to the HD access center for fistulogram.  She has been having issues with low pressures and low flows during sessions.  Her last session was yesterday.  She recently underwent fistulogram with balloon angioplasty of the venous anastomotic and a central lesion with Dr. Sheree about 2 weeks ago.  During that time she was noted to have a significantly calcified anastomosis and proximal fistula.  Risks and benefits of repeat fistulogram with intervention were reviewed and she elected to proceed. Findings: Significantly calcified anastomosis and proximal portion of fistula.  Approximate 70% stenosis of the venous anastomosis.  Approximate 90% stenosis of the right innominate vein.  Procedure:  The patient was identified in the holding area and taken to the cath lab  The patient was then placed supine on the table and prepped and draped in the usual sterile fashion.  A time out was called.  Ultrasound was used to evaluate the right arm AV access. This was accessed under u/s guidance. An 018 wire was advanced without resistance, a micropuncture sheath was placed and fistulagram obtained which demonstrated the above findings.  This access was then upsized to a 6 F short sheath over a glidewire.  A Glidewire was then used to cross the lesions.  These lesions were treated with a 10  mm x 40 mm Mustang balloon.  The venous anastomosis had approximately 20% residual stenosis and the innominate vein and proximal 40% residual stenosis.  The wire  and sheath were removed and the access was managed with a 4 Monocryl figure-of-eight suture for hemostasis. Contrast: 35 cc Sedation: None Impression: Adequate result of balloon angioplasty of the venous anastomosis and right innominate vein. If she continues to have issues with dialysis and low flows she will need revision in the operating room during AV graft and should not be rescheduled for percutaneous intervention. Norman GORMAN Serve MD [facility] Office: [phone]    Microbiology: No results found for this or any previous visit (from the past 240 hours).  Time spent: 35 minutes  Signed: Donalda Applebaum, MD 09-25-2024

## 2024-09-27 NOTE — Progress Notes (Signed)
 Comfort care noted. Contacted out-pt HD clinic, [facility], and informed pt will be stopping dialysis at this time. No further support needed.   Lavanda Siyah Mault Dialysis Navigator [phone]

## 2024-09-27 DEATH — deceased

## 2024-10-17 ENCOUNTER — Other Ambulatory Visit (HOSPITAL_COMMUNITY): Payer: Self-pay

## 2024-10-18 ENCOUNTER — Other Ambulatory Visit (HOSPITAL_COMMUNITY): Payer: Self-pay

## 2025-01-09 ENCOUNTER — Other Ambulatory Visit (HOSPITAL_COMMUNITY): Payer: Self-pay
